# Patient Record
Sex: Female | Born: 1955 | Race: Black or African American | Hispanic: No | Smoking: Former smoker
Health system: Southern US, Community
[De-identification: ages and names within clinical notes are randomized; demographics above are authoritative.]

## PROBLEM LIST (undated history)

## (undated) DIAGNOSIS — E119 Type 2 diabetes mellitus without complications: Secondary | ICD-10-CM

## (undated) DIAGNOSIS — I2510 Atherosclerotic heart disease of native coronary artery without angina pectoris: Secondary | ICD-10-CM

## (undated) DIAGNOSIS — I10 Essential (primary) hypertension: Secondary | ICD-10-CM

## (undated) DIAGNOSIS — J449 Chronic obstructive pulmonary disease, unspecified: Secondary | ICD-10-CM

## (undated) DIAGNOSIS — I998 Other disorder of circulatory system: Secondary | ICD-10-CM

## (undated) DIAGNOSIS — F1410 Cocaine abuse, uncomplicated: Secondary | ICD-10-CM

## (undated) DIAGNOSIS — R7989 Other specified abnormal findings of blood chemistry: Secondary | ICD-10-CM

## (undated) DIAGNOSIS — F419 Anxiety disorder, unspecified: Secondary | ICD-10-CM

## (undated) DIAGNOSIS — N189 Chronic kidney disease, unspecified: Secondary | ICD-10-CM

## (undated) DIAGNOSIS — E1110 Type 2 diabetes mellitus with ketoacidosis without coma: Secondary | ICD-10-CM

## (undated) DIAGNOSIS — F32A Depression, unspecified: Secondary | ICD-10-CM

## (undated) DIAGNOSIS — K219 Gastro-esophageal reflux disease without esophagitis: Secondary | ICD-10-CM

## (undated) DIAGNOSIS — K922 Gastrointestinal hemorrhage, unspecified: Secondary | ICD-10-CM

## (undated) DIAGNOSIS — R778 Other specified abnormalities of plasma proteins: Secondary | ICD-10-CM

## (undated) DIAGNOSIS — M1990 Unspecified osteoarthritis, unspecified site: Secondary | ICD-10-CM

## (undated) HISTORY — PX: EYE SURGERY: SHX253

## (undated) HISTORY — PX: CHOLECYSTECTOMY: SHX55

## (undated) HISTORY — PX: ABDOMINAL HYSTERECTOMY: SHX81

## (undated) HISTORY — DX: Other specified abnormalities of plasma proteins: R77.8

---

## 2004-09-18 ENCOUNTER — Other Ambulatory Visit: Payer: Self-pay

## 2004-09-18 ENCOUNTER — Emergency Department: Payer: Self-pay | Admitting: Emergency Medicine

## 2005-02-01 ENCOUNTER — Emergency Department: Payer: Self-pay | Admitting: Emergency Medicine

## 2005-03-01 ENCOUNTER — Emergency Department: Payer: Self-pay | Admitting: Emergency Medicine

## 2005-03-01 ENCOUNTER — Other Ambulatory Visit: Payer: Self-pay

## 2005-03-03 ENCOUNTER — Emergency Department: Payer: Self-pay | Admitting: Emergency Medicine

## 2005-05-19 ENCOUNTER — Emergency Department: Payer: Self-pay | Admitting: Emergency Medicine

## 2005-05-19 ENCOUNTER — Other Ambulatory Visit: Payer: Self-pay

## 2006-08-30 ENCOUNTER — Emergency Department: Payer: Self-pay | Admitting: Emergency Medicine

## 2006-11-06 ENCOUNTER — Emergency Department: Payer: Self-pay | Admitting: Emergency Medicine

## 2007-02-01 ENCOUNTER — Other Ambulatory Visit: Payer: Self-pay

## 2007-02-01 ENCOUNTER — Emergency Department: Payer: Self-pay | Admitting: Emergency Medicine

## 2007-05-17 ENCOUNTER — Emergency Department: Payer: Self-pay

## 2007-07-14 ENCOUNTER — Emergency Department: Payer: Self-pay | Admitting: Emergency Medicine

## 2015-07-28 ENCOUNTER — Ambulatory Visit
Admission: RE | Admit: 2015-07-28 | Discharge: 2015-07-28 | Disposition: A | Discharge status: Home / Self Care | Payer: Self-pay | Source: Ambulatory Visit | Attending: Family Medicine | Admitting: Family Medicine

## 2015-07-28 ENCOUNTER — Ambulatory Visit
Admission: RE | Admit: 2015-07-28 | Discharge: 2015-07-28 | Disposition: A | Discharge status: Home / Self Care | Payer: Self-pay | Source: Ambulatory Visit | Attending: *Deleted | Admitting: *Deleted

## 2015-07-28 ENCOUNTER — Other Ambulatory Visit (HOSPITAL_COMMUNITY): Payer: Self-pay | Admitting: Family Medicine

## 2015-07-28 DIAGNOSIS — R7611 Nonspecific reaction to tuberculin skin test without active tuberculosis: Secondary | ICD-10-CM

## 2015-11-14 ENCOUNTER — Emergency Department
Admission: EM | Admit: 2015-11-14 | Discharge: 2015-11-14 | Disposition: A | Discharge status: Home / Self Care | Payer: Self-pay | Attending: Emergency Medicine | Admitting: Emergency Medicine

## 2015-11-14 ENCOUNTER — Encounter: Payer: Self-pay | Admitting: Emergency Medicine

## 2015-11-14 DIAGNOSIS — F17200 Nicotine dependence, unspecified, uncomplicated: Secondary | ICD-10-CM | POA: Insufficient documentation

## 2015-11-14 DIAGNOSIS — X500XXA Overexertion from strenuous movement or load, initial encounter: Secondary | ICD-10-CM | POA: Insufficient documentation

## 2015-11-14 DIAGNOSIS — S39011A Strain of muscle, fascia and tendon of abdomen, initial encounter: Secondary | ICD-10-CM | POA: Insufficient documentation

## 2015-11-14 DIAGNOSIS — I10 Essential (primary) hypertension: Secondary | ICD-10-CM | POA: Insufficient documentation

## 2015-11-14 DIAGNOSIS — Y990 Civilian activity done for income or pay: Secondary | ICD-10-CM | POA: Insufficient documentation

## 2015-11-14 DIAGNOSIS — Y929 Unspecified place or not applicable: Secondary | ICD-10-CM | POA: Insufficient documentation

## 2015-11-14 DIAGNOSIS — Y93F2 Activity, caregiving, lifting: Secondary | ICD-10-CM | POA: Insufficient documentation

## 2015-11-14 DIAGNOSIS — E119 Type 2 diabetes mellitus without complications: Secondary | ICD-10-CM | POA: Insufficient documentation

## 2015-11-14 HISTORY — DX: Essential (primary) hypertension: I10

## 2015-11-14 HISTORY — DX: Type 2 diabetes mellitus without complications: E11.9

## 2015-11-14 LAB — CBC
HCT: 33.4 % — ABNORMAL LOW (ref 35.0–47.0)
Hemoglobin: 11.7 g/dL — ABNORMAL LOW (ref 12.0–16.0)
MCH: 30 pg (ref 26.0–34.0)
MCHC: 35 g/dL (ref 32.0–36.0)
MCV: 85.7 fL (ref 80.0–100.0)
Platelets: 293 10*3/uL (ref 150–440)
RBC: 3.9 MIL/uL (ref 3.80–5.20)
RDW: 14 % (ref 11.5–14.5)
WBC: 7.5 10*3/uL (ref 3.6–11.0)

## 2015-11-14 LAB — COMPREHENSIVE METABOLIC PANEL
ALT: 19 U/L (ref 14–54)
AST: 19 U/L (ref 15–41)
Albumin: 4.2 g/dL (ref 3.5–5.0)
Alkaline Phosphatase: 121 U/L (ref 38–126)
Anion gap: 9 (ref 5–15)
BUN: 15 mg/dL (ref 6–20)
CO2: 22 mmol/L (ref 22–32)
Calcium: 10.2 mg/dL (ref 8.9–10.3)
Chloride: 106 mmol/L (ref 101–111)
Creatinine, Ser: 0.77 mg/dL (ref 0.44–1.00)
GFR calc Af Amer: >60 mL/min (ref >60)
GFR calc non Af Amer: >60 mL/min (ref >60)
Glucose, Bld: 208 mg/dL — ABNORMAL HIGH (ref 65–99)
Potassium: 3.9 mmol/L (ref 3.5–5.1)
Sodium: 137 mmol/L (ref 135–145)
Total Bilirubin: 0.6 mg/dL (ref 0.3–1.2)
Total Protein: 8.2 g/dL — ABNORMAL HIGH (ref 6.5–8.1)

## 2015-11-14 LAB — URINALYSIS COMPLETE WITH MICROSCOPIC (ARMC ONLY)
Bilirubin Urine: NEGATIVE
Glucose, UA: 150 mg/dL — AB
Hgb urine dipstick: NEGATIVE
Ketones, ur: NEGATIVE mg/dL
Leukocytes, UA: NEGATIVE
Nitrite: NEGATIVE
Protein, ur: 30 mg/dL — AB
Specific Gravity, Urine: 1.014 (ref 1.005–1.030)
pH: 5 (ref 5.0–8.0)

## 2015-11-14 LAB — LIPASE, BLOOD: Lipase: 85 U/L — ABNORMAL HIGH (ref 11–51)

## 2015-11-14 MED ORDER — HYDROCODONE-ACETAMINOPHEN 5-325 MG PO TABS: 1.0000 | ORAL_TABLET | ORAL | 0 refills | Status: DC | PRN

## 2015-11-14 NOTE — ED Notes (Signed)
States she developed pain to right flank area which radiates into right lower abd about 1 week ago states she was seen and dx'd with muscle strain  Was placed on tramadol but did not help  then was rx'd naprosen  But she did not get it filled has used tylenol with some relief  Denies any fever,n/v/d or urinary sx's

## 2015-11-14 NOTE — Discharge Instructions (Signed)
Begin taking naproxen twice a day with food until completely finished. Norco as needed for pain. Apply ice to the right groin area 3-4 times per day.

## 2015-11-14 NOTE — ED Provider Notes (Signed)
[facility] Emergency Department Provider Note   ____________________________________________   None    (approximate)  I have reviewed the triage vital signs and the nursing notes.   HISTORY  Chief Complaint Abdominal Pain   HPI Sylvia [name] is a 60 y.o. female is here with complaint of right lower quadrant pain. 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 On Tuesday she went back for follow-up. Patient states that they had her do stretches using her right leg abducted and then stretching. Patient states that shortly after that she began having pain in her right lower quadrant. They continued to do more stretches for her work-related muscle strain. Patient states that initially she was given tramadol for pain which did not help and then Tuesday was given naproxen which also did not help. Patient denies any fever, nausea, vomiting, diarrhea or urinary symptoms. Currently she rates her pain as a 10 over 10. Patient has not taken any prescription medicine prior to arrival in the emergency room. She has eaten chips while waiting in the lobby. Her last bowel movement was yesterday and was considered normal.   Past Medical History:  Diagnosis Date  . Diabetes mellitus without complication (HCC)   . Hypertension     There are no active problems to display for this patient.   Past Surgical History:  Procedure Laterality Date  . ABDOMINAL HYSTERECTOMY      Prior to Admission medications   Medication Sig Start Date End Date Taking? Authorizing Provider  HYDROcodone-acetaminophen (NORCO/VICODIN) 5-325 MG tablet Take 1 tablet by mouth every 4 (four) hours as needed for moderate pain. 11/14/15   Tommi Rumps, PA-C    Allergies Penicillins  No family history on file.  Social History Social History  Substance Use Topics  . Smoking status: Current Every Day Smoker  .  Smokeless tobacco: Never Used  . Alcohol use Not on file    Review of Systems Constitutional: No fever/chills ENT: No sore throat. Cardiovascular: Denies chest pain. Respiratory: Denies shortness of breath. Gastrointestinal: Right lower quadrant abdominal pain.  No nausea, no vomiting.  No diarrhea.  No constipation. Genitourinary: Negative for dysuria. Musculoskeletal: Positive right groin pain Skin: Negative for rash. Neurological: Negative for headaches, focal weakness or numbness.  10-point ROS otherwise negative.  ____________________________________________   PHYSICAL EXAM:  VITAL SIGNS: ED Triage Vitals  Enc Vitals Group     BP 11/14/15 1043 124/68     Pulse Rate 11/14/15 1043 96     Resp 11/14/15 1043 18     Temp 11/14/15 1043 98.2 F (36.8 C)     Temp Source 11/14/15 1043 Oral     SpO2 11/14/15 1043 100 %     Weight 11/14/15 1043 154 lb (69.9 kg)     Height 11/14/15 1043 5\' 7"  (1.702 m)     Head Circumference --      Peak Flow --      Pain Score 11/14/15 1044 10     Pain Loc --      Pain Edu? --      Excl. in GC? --     Constitutional: Alert and oriented. Well appearing and in no acute distress. Eyes: Conjunctivae are normal. PERRL. EOMI. Head: Atraumatic. Nose: No congestion/rhinnorhea. Neck: No stridor.   Cardiovascular: Normal rate, regular rhythm. Grossly normal heart sounds.  Good peripheral circulation. Respiratory: Normal respiratory effort.  No  retractions. Lungs CTAB. Gastrointestinal: Soft and nontender. No distention. No abdominal bruits. Bowel sounds normoactive 4 quadrants. Negative tenderness on McBurney's point. There is reproduction of pain if the thigh is abducted. There is also a reproduction of pain with extension of the right leg in the groin area. Musculoskeletal: Moves upper extremities without any difficulty. Patient's gait is antalgic secondary to her right upper thigh pain. Patient is slow with movement from getting to a sitting to  standing position and also to supine position. There is no edema or tenderness on palpation of the lower legs. Neurologic:  Normal speech and language. No gross focal neurologic deficits are appreciated. No gait instability. Skin:  Skin is warm, dry and intact. No rash noted. No ecchymosis, abrasions, erythema was noted. Psychiatric: Mood and affect are normal. Speech and behavior are normal.  ____________________________________________   LABS (all labs ordered are listed, but only abnormal results are displayed)  Labs Reviewed  LIPASE, BLOOD - Abnormal; Notable for the following:       Result Value   Lipase 85 (*)    All other components within normal limits  COMPREHENSIVE METABOLIC PANEL - Abnormal; Notable for the following:    Glucose, Bld 208 (*)    Total Protein 8.2 (*)    All other components within normal limits  CBC - Abnormal; Notable for the following:    Hemoglobin 11.7 (*)    HCT 33.4 (*)    All other components within normal limits  URINALYSIS COMPLETEWITH MICROSCOPIC ([facility] ONLY) - Abnormal; Notable for the following:    Color, Urine YELLOW (*)    APPearance CLEAR (*)    Glucose, UA 150 (*)    Protein, ur 30 (*)    Bacteria, UA RARE (*)    Squamous Epithelial / LPF 0-5 (*)    All other components within normal limits     PROCEDURES  Procedure(s) performed: None  Procedures  Critical Care performed: No  ____________________________________________   INITIAL IMPRESSION / ASSESSMENT AND PLAN / ED COURSE  Pertinent labs & imaging results that were available during my care of the patient were reviewed by me and considered in my medical decision making (see chart for details).  Patient was reassured that this most likely is a muscle strain and exacerbation secondary to her physical therapy. Patient is to continue taking naproxen but also will give a prescription for Norco as needed for pain. She is encouraged to use ice to the area and follow-up with  [facility] clinic if any continued problems.  Clinical Course   Patient was ambulatory without assistance from the emergency department.  ____________________________________________   FINAL CLINICAL IMPRESSION(S) / ED DIAGNOSES  Final diagnoses:  Abdominal muscle strain, initial encounter      NEW MEDICATIONS STARTED DURING THIS VISIT:  Discharge Medication List as of 11/14/2015  2:18 PM    START taking these medications   Details  HYDROcodone-acetaminophen (NORCO/VICODIN) 5-325 MG tablet Take 1 tablet by mouth every 4 (four) hours as needed for moderate pain., Starting Fri 11/14/2015, Print         Note:  This document was prepared using Dragon voice recognition software and may include unintentional dictation errors.    Tommi Rumps, PA-C 11/14/15 1436    Myrna Blazer, MD 11/14/15 [phone]

## 2015-11-14 NOTE — ED Triage Notes (Signed)
Was injuried at last Friday with eval at [facility] right hip pain , with dx of sprain , follow up on Tuesday, did some stretches and felt a pulling to RLQ abd,  And since increased pain to RLQ

## 2016-10-13 ENCOUNTER — Emergency Department: Payer: Self-pay

## 2016-10-13 ENCOUNTER — Emergency Department
Admission: EM | Admit: 2016-10-13 | Discharge: 2016-10-13 | Disposition: A | Discharge status: Home / Self Care | Payer: Self-pay | Attending: Student in an Organized Health Care Education/Training Program | Admitting: Student in an Organized Health Care Education/Training Program

## 2016-10-13 ENCOUNTER — Encounter: Payer: Self-pay | Admitting: *Deleted

## 2016-10-13 DIAGNOSIS — R079 Chest pain, unspecified: Secondary | ICD-10-CM

## 2016-10-13 DIAGNOSIS — Z7984 Long term (current) use of oral hypoglycemic drugs: Secondary | ICD-10-CM | POA: Insufficient documentation

## 2016-10-13 DIAGNOSIS — F17200 Nicotine dependence, unspecified, uncomplicated: Secondary | ICD-10-CM | POA: Insufficient documentation

## 2016-10-13 DIAGNOSIS — J40 Bronchitis, not specified as acute or chronic: Secondary | ICD-10-CM | POA: Insufficient documentation

## 2016-10-13 DIAGNOSIS — I10 Essential (primary) hypertension: Secondary | ICD-10-CM | POA: Insufficient documentation

## 2016-10-13 DIAGNOSIS — Z79899 Other long term (current) drug therapy: Secondary | ICD-10-CM | POA: Insufficient documentation

## 2016-10-13 DIAGNOSIS — E119 Type 2 diabetes mellitus without complications: Secondary | ICD-10-CM | POA: Insufficient documentation

## 2016-10-13 LAB — BASIC METABOLIC PANEL
Anion gap: 9 (ref 5–15)
BUN: 11 mg/dL (ref 6–20)
CO2: 21 mmol/L — ABNORMAL LOW (ref 22–32)
Calcium: 9.6 mg/dL (ref 8.9–10.3)
Chloride: 103 mmol/L (ref 101–111)
Creatinine, Ser: 0.75 mg/dL (ref 0.44–1.00)
GFR calc Af Amer: >60 mL/min (ref >60)
GFR calc non Af Amer: >60 mL/min (ref >60)
Glucose, Bld: 212 mg/dL — ABNORMAL HIGH (ref 65–99)
Potassium: 3.8 mmol/L (ref 3.5–5.1)
Sodium: 133 mmol/L — ABNORMAL LOW (ref 135–145)

## 2016-10-13 LAB — CBC
HCT: 34.3 % — ABNORMAL LOW (ref 35.0–47.0)
Hemoglobin: 11.9 g/dL — ABNORMAL LOW (ref 12.0–16.0)
MCH: 28.9 pg (ref 26.0–34.0)
MCHC: 34.6 g/dL (ref 32.0–36.0)
MCV: 83.7 fL (ref 80.0–100.0)
Platelets: 307 10*3/uL (ref 150–440)
RBC: 4.1 MIL/uL (ref 3.80–5.20)
RDW: 14.2 % (ref 11.5–14.5)
WBC: 7.4 10*3/uL (ref 3.6–11.0)

## 2016-10-13 LAB — TROPONIN I
Troponin I: <0.03 ng/mL (ref <0.03)
Troponin I: <0.03 ng/mL (ref <0.03)

## 2016-10-13 MED ORDER — NITROGLYCERIN 0.4 MG SL SUBL
0.4000 mg | SUBLINGUAL_TABLET | SUBLINGUAL | Status: DC | PRN
  Filled 2016-10-13: qty 1

## 2016-10-13 MED ORDER — METHYLPREDNISOLONE SODIUM SUCC 125 MG IJ SOLR
60.0000 mg | Freq: Once | INTRAMUSCULAR | Status: AC
  Administered 2016-10-13: 60 mg via INTRAVENOUS
  Filled 2016-10-13: qty 2

## 2016-10-13 MED ORDER — IOPAMIDOL (ISOVUE-370) INJECTION 76%
75.0000 mL | Freq: Once | INTRAVENOUS | Status: AC | PRN
  Administered 2016-10-13: 75 mL via INTRAVENOUS

## 2016-10-13 MED ORDER — FENTANYL CITRATE (PF) 100 MCG/2ML IJ SOLN
50.0000 ug | INTRAMUSCULAR | Status: DC | PRN
  Administered 2016-10-13: 50 ug via INTRAVENOUS
  Filled 2016-10-13: qty 2

## 2016-10-13 MED ORDER — ALBUTEROL SULFATE HFA 108 (90 BASE) MCG/ACT IN AERS: 2.0000 | INHALATION_SPRAY | Freq: Four times a day (QID) | RESPIRATORY_TRACT | 2 refills | Status: DC | PRN

## 2016-10-13 MED ORDER — HYDROCODONE-ACETAMINOPHEN 5-325 MG PO TABS: 1.0000 | ORAL_TABLET | ORAL | 0 refills | Status: DC | PRN

## 2016-10-13 MED ORDER — LORAZEPAM 2 MG/ML IJ SOLN
1.0000 mg | Freq: Once | INTRAMUSCULAR | Status: AC
  Administered 2016-10-13: 1 mg via INTRAVENOUS
  Filled 2016-10-13: qty 1

## 2016-10-13 MED ORDER — IPRATROPIUM-ALBUTEROL 0.5-2.5 (3) MG/3ML IN SOLN
3.0000 mL | Freq: Once | RESPIRATORY_TRACT | Status: AC
  Administered 2016-10-13: 3 mL via RESPIRATORY_TRACT
  Filled 2016-10-13: qty 3

## 2016-10-13 MED ORDER — IBUPROFEN 600 MG PO TABS
600.0000 mg | ORAL_TABLET | Freq: Once | ORAL | Status: DC
  Filled 2016-10-13: qty 1

## 2016-10-13 MED ORDER — PREDNISONE 20 MG PO TABS: 40.0000 mg | ORAL_TABLET | Freq: Every day | ORAL | 0 refills | Status: AC

## 2016-10-13 NOTE — ED Triage Notes (Addendum)
states mid center chest tightness that began this AM, states worse with deep breathes, also states neck pain, states dry cough, pt crying in triage, resp even and unlabored

## 2016-10-13 NOTE — ED Provider Notes (Signed)
[facility] Emergency Department Provider Note    First MD Initiated Contact with Patient 10/13/16 1149     (approximate)  I have reviewed the triage vital signs and the nursing notes.   HISTORY  Chief Complaint Chest Pain    HPI Sylvia [name] is a 61 y.o. 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 Patient states the pain did get worse with taking a deep breath. The patient felt very anxious and tearful. There is no exertional component. She does feel short of breath and it hurts to take a deep breath. No recent fevers. Denies any previous history of heart attack. No lower extremity swelling. No pain radiating through to her back. Denies numbness or tingling in her arms or legs. Patient arrives to the ER very tearful and nearly refused treatment after being brought back to one of the treatment rooms where she lost her mother and February of this year. Patient subsequently lost her daughter in this ER as well. Patient is very tearful and states that she's been under a lot of stress recently.   Past Medical History:  Diagnosis Date  . Diabetes mellitus without complication (HCC)   . Hypertension    FMH: + cardiac disease Past Surgical History:  Procedure Laterality Date  . ABDOMINAL HYSTERECTOMY     There are no active problems to display for this patient.     Prior to Admission medications   Medication Sig Start Date End Date Taking? Authorizing Provider  atorvastatin (LIPITOR) 40 MG tablet Take 40 mg by mouth daily. 08/26/16  Yes [provider]  glipiZIDE (GLUCOTROL) 5 MG tablet Take 5 mg by mouth daily before breakfast.   Yes [provider]  albuterol (PROVENTIL HFA;VENTOLIN HFA) 108 (90 Base) MCG/ACT inhaler Inhale 2 puffs into the lungs every 6 (six)  hours as needed for wheezing or shortness of breath. 10/13/16   Willy Eddy, MD  HYDROcodone-acetaminophen (NORCO) 5-325 MG tablet Take 1 tablet by mouth every 4 (four) hours as needed for moderate pain. 10/13/16   Willy Eddy, MD  HYDROcodone-acetaminophen (NORCO/VICODIN) 5-325 MG tablet Take 1 tablet by mouth every 4 (four) hours as needed for moderate pain. Patient not taking: Reported on 10/13/2016 11/14/15   Tommi Rumps, PA-C  predniSONE (DELTASONE) 20 MG tablet Take 2 tablets (40 mg total) by mouth daily. 10/13/16 10/18/16  Willy Eddy, MD    Allergies Penicillins    Social History Social History  Substance Use Topics  . Smoking status: Current Every Day Smoker  . Smokeless tobacco: Never Used  . Alcohol use Not on file    Review of Systems Patient denies headaches, rhinorrhea, blurry vision, numbness, shortness of breath, chest pain, edema, cough, abdominal pain, nausea, vomiting, diarrhea, dysuria, fevers, rashes or hallucinations unless otherwise stated above in HPI. ____________________________________________   PHYSICAL EXAM:  VITAL SIGNS: Vitals:   10/13/16 1330 10/13/16 1330  BP: (!) 146/81 (!) 146/81  Pulse: 70 71  Resp: 16 18  Temp:      Constitutional: Alert and oriented. Tearful but consolable. Eyes: Conjunctivae are normal.  Head: Atraumatic. Nose: No congestion/rhinnorhea. Mouth/Throat: Mucous membranes are moist.   Neck: No stridor. Painless ROM.  Cardiovascular: Normal rate, regular rhythm. Grossly normal heart sounds.  Good peripheral circulation. Respiratory: Normal respiratory effort.  No retractions. Lungs with occasional wheeze  throughout Gastrointestinal: Soft and nontender. No distention. No abdominal bruits. No CVA tenderness. Musculoskeletal: No lower extremity tenderness nor edema.  No joint effusions. Neurologic:  Normal speech and language. No gross focal neurologic deficits are appreciated. No facial droop Skin:  Skin  is warm, dry and intact. No rash noted. Psychiatric: tearful, anxious appearing, no SI or HI  ____________________________________________   LABS (all labs ordered are listed, but only abnormal results are displayed)  Results for orders placed or performed during the hospital encounter of 10/13/16 (from the past 24 hour(s))  Basic metabolic panel     Status: Abnormal   Collection Time: 10/13/16 11:10 AM  Result Value Ref Range   Sodium 133 (L) 135 - 145 mmol/L   Potassium 3.8 3.5 - 5.1 mmol/L   Chloride 103 101 - 111 mmol/L   CO2 21 (L) 22 - 32 mmol/L   Glucose, Bld 212 (H) 65 - 99 mg/dL   BUN 11 6 - 20 mg/dL   Creatinine, Ser 6.37 0.44 - 1.00 mg/dL   Calcium 9.6 8.9 - 85.8 mg/dL   GFR calc non Af Amer >60 >60 mL/min   GFR calc Af Amer >60 >60 mL/min   Anion gap 9 5 - 15  CBC     Status: Abnormal   Collection Time: 10/13/16 11:10 AM  Result Value Ref Range   WBC 7.4 3.6 - 11.0 K/uL   RBC 4.10 3.80 - 5.20 MIL/uL   Hemoglobin 11.9 (L) 12.0 - 16.0 g/dL   HCT 85.0 (L) 27.7 - 41.2 %   MCV 83.7 80.0 - 100.0 fL   MCH 28.9 26.0 - 34.0 pg   MCHC 34.6 32.0 - 36.0 g/dL   RDW 87.8 67.6 - 72.0 %   Platelets 307 150 - 440 K/uL  Troponin I     Status: None   Collection Time: 10/13/16 11:10 AM  Result Value Ref Range   Troponin I <0.03 <0.03 ng/mL  Troponin I     Status: None   Collection Time: 10/13/16  3:26 PM  Result Value Ref Range   Troponin I <0.03 <0.03 ng/mL   ____________________________________________  EKG My review and personal interpretation at Time: 11:08   Indication: chest pain  Rate: 90  Rhythm: sinus Axis: normal  Other: no stemi, normal intervals ____________________________________________  RADIOLOGY  I personally reviewed all radiographic images ordered to evaluate for the above acute complaints and reviewed radiology reports and findings.  These findings were personally discussed with the patient.  Please see medical record for radiology  report.  ____________________________________________   PROCEDURES  Procedure(s) performed:  Procedures    Critical Care performed: no ____________________________________________   INITIAL IMPRESSION / ASSESSMENT AND PLAN / ED COURSE  Pertinent labs & imaging results that were available during my care of the patient were reviewed by me and considered in my medical decision making (see chart for details).  DDX: ACS, pericarditis, esophagitis, boerhaaves, pe, dissection, pna, bronchitis, costochondritis   Peony Barner is a 61 y.o. who presents to the ED with chest pain and shortness of breath as described above. Patient very tearful but nontoxic appearing. EKG shows no evidence of acute ST elevation MI or acute ischemia and her initial troponin is negative. Patient does describe a pleuritic chest pain therefore we will further evaluate for evidence of pulmonary was him with CT angiogram.. Less consistent with dissection as she has no pain radiating through to her back and has equal pulses throughout. Given her recent stressors and also  recent loss of close family members tak[place]subo  is considered in the differential.  However, she doesn't have any objective findings of chf at this time.  Patient with Heart score of 3 (10110) and TIMI score of 1 We'll treat patient with IV fluids and IV anxiolysis. Patient will require further evaluation and she is otherwise high risk and will attempt to get her comfortable.  ----------------------------------------- 12:07 PM on 10/13/2016 -----------------------------------------   OBSERVATION CARE: This patient is being placed under observation care for the following reasons: Chest pain with repeat testing to rule out ischemia   Clinical Course as of Oct 14 1711  Wed Oct 13, 2016  1347 Chest x-ray without evidence of cardiomegaly. There is diffuse bronchitic changes. CT angiogram of the chest shows no evidence of dissection, pulmonary  embolism or infiltrate. No evidence of pericardial effusion. Patient is currently chest pain-free and is comfortable. We'll further risk stratify with repeat troponin.  [PR]  1638 Patient reassessed and is chest pain-free at this time. Those significant improvement after Ativan. Repeat troponin is negative. Based on her low risk heart score, negative CT angiogram and improvement in pain and do feel the patient is appropriate for close follow-up with cardiology.  Patient will be started on steroids as well as albuterol treatment for component of bronchitis. Smoking cessation advice was provided.  Have discussed with the patient and available family all diagnostics and treatments performed thus far and all questions were answered to the best of my ability. The patient demonstrates understanding and agreement with plan.   [PR]    Clinical Course User Index [PR] Willy Eddy, MD   ----------------------------------------- 4:38 PM on 10/13/2016 -----------------------------------------   END OF OBSERVATION STATUS: After an appropriate period of observation, this patient is being discharged due to the following reason(s):  Negative enzymes and hds    ____________________________________________   FINAL CLINICAL IMPRESSION(S) / ED DIAGNOSES  Final diagnoses:  Chest pain, unspecified type  Bronchitis      NEW MEDICATIONS STARTED DURING THIS VISIT:  Discharge Medication List as of 10/13/2016  4:42 PM       Note:  This document was prepared using Dragon voice recognition software and may include unintentional dictation errors.    Willy Eddy, MD 10/13/16 [phone]

## 2016-10-13 NOTE — ED Notes (Signed)
Substernal chest pain worse upon palpation.  Pt tearful and appears anxious in room.  EDP at bedside.

## 2017-02-25 ENCOUNTER — Encounter: Payer: Self-pay | Admitting: Emergency Medicine

## 2017-02-25 ENCOUNTER — Other Ambulatory Visit: Payer: Self-pay

## 2017-02-25 ENCOUNTER — Emergency Department: Payer: Self-pay

## 2017-02-25 ENCOUNTER — Emergency Department
Admission: EM | Admit: 2017-02-25 | Discharge: 2017-02-25 | Disposition: A | Discharge status: Home / Self Care | Payer: Self-pay | Attending: Emergency Medicine | Admitting: Emergency Medicine

## 2017-02-25 DIAGNOSIS — R42 Dizziness and giddiness: Secondary | ICD-10-CM | POA: Insufficient documentation

## 2017-02-25 DIAGNOSIS — I10 Essential (primary) hypertension: Secondary | ICD-10-CM | POA: Insufficient documentation

## 2017-02-25 DIAGNOSIS — Z79899 Other long term (current) drug therapy: Secondary | ICD-10-CM | POA: Insufficient documentation

## 2017-02-25 DIAGNOSIS — R197 Diarrhea, unspecified: Secondary | ICD-10-CM | POA: Insufficient documentation

## 2017-02-25 DIAGNOSIS — E119 Type 2 diabetes mellitus without complications: Secondary | ICD-10-CM | POA: Insufficient documentation

## 2017-02-25 DIAGNOSIS — Z7984 Long term (current) use of oral hypoglycemic drugs: Secondary | ICD-10-CM | POA: Insufficient documentation

## 2017-02-25 DIAGNOSIS — B349 Viral infection, unspecified: Secondary | ICD-10-CM | POA: Insufficient documentation

## 2017-02-25 DIAGNOSIS — F17200 Nicotine dependence, unspecified, uncomplicated: Secondary | ICD-10-CM | POA: Insufficient documentation

## 2017-02-25 LAB — URINALYSIS, COMPLETE (UACMP) WITH MICROSCOPIC
Bacteria, UA: NONE SEEN
Bilirubin Urine: NEGATIVE
Glucose, UA: >=500 mg/dL — AB
Ketones, ur: NEGATIVE mg/dL
Leukocytes, UA: NEGATIVE
Nitrite: NEGATIVE
Protein, ur: 100 mg/dL — AB
Specific Gravity, Urine: 1.012 (ref 1.005–1.030)
pH: 5 (ref 5.0–8.0)

## 2017-02-25 LAB — BASIC METABOLIC PANEL
Anion gap: 9 (ref 5–15)
BUN: 10 mg/dL (ref 6–20)
CO2: 23 mmol/L (ref 22–32)
Calcium: 10 mg/dL (ref 8.9–10.3)
Chloride: 106 mmol/L (ref 101–111)
Creatinine, Ser: 0.73 mg/dL (ref 0.44–1.00)
GFR calc Af Amer: >60 mL/min (ref >60)
GFR calc non Af Amer: >60 mL/min (ref >60)
Glucose, Bld: 190 mg/dL — ABNORMAL HIGH (ref 65–99)
Potassium: 3.7 mmol/L (ref 3.5–5.1)
Sodium: 138 mmol/L (ref 135–145)

## 2017-02-25 LAB — CBC
HCT: 33 % — ABNORMAL LOW (ref 35.0–47.0)
Hemoglobin: 11.4 g/dL — ABNORMAL LOW (ref 12.0–16.0)
MCH: 30 pg (ref 26.0–34.0)
MCHC: 34.6 g/dL (ref 32.0–36.0)
MCV: 86.5 fL (ref 80.0–100.0)
Platelets: 359 10*3/uL (ref 150–440)
RBC: 3.81 MIL/uL (ref 3.80–5.20)
RDW: 13.9 % (ref 11.5–14.5)
WBC: 7.6 10*3/uL (ref 3.6–11.0)

## 2017-02-25 LAB — CK: Total CK: 89 U/L (ref 38–234)

## 2017-02-25 LAB — TROPONIN I: Troponin I: <0.03 ng/mL (ref <0.03)

## 2017-02-25 MED ORDER — MECLIZINE HCL 25 MG PO TABS: 25.0000 mg | ORAL_TABLET | Freq: Three times a day (TID) | ORAL | 0 refills | Status: DC | PRN

## 2017-02-25 MED ORDER — ALBUTEROL SULFATE (2.5 MG/3ML) 0.083% IN NEBU
2.5000 mg | INHALATION_SOLUTION | Freq: Once | RESPIRATORY_TRACT | Status: AC
  Administered 2017-02-25: 2.5 mg via RESPIRATORY_TRACT
  Filled 2017-02-25: qty 3

## 2017-02-25 MED ORDER — SODIUM CHLORIDE 0.9 % IV BOLUS (SEPSIS)
1000.0000 mL | Freq: Once | INTRAVENOUS | Status: AC
  Administered 2017-02-25: 1000 mL via INTRAVENOUS

## 2017-02-25 MED ORDER — MECLIZINE HCL 25 MG PO TABS
25.0000 mg | ORAL_TABLET | Freq: Once | ORAL | Status: AC
  Administered 2017-02-25: 25 mg via ORAL
  Filled 2017-02-25: qty 1

## 2017-02-25 MED ORDER — IBUPROFEN 800 MG PO TABS
800.0000 mg | ORAL_TABLET | Freq: Once | ORAL | Status: AC
  Administered 2017-02-25: 800 mg via ORAL
  Filled 2017-02-25: qty 1

## 2017-02-25 MED ORDER — KETOROLAC TROMETHAMINE 30 MG/ML IJ SOLN
30.0000 mg | Freq: Once | INTRAMUSCULAR | Status: AC
  Administered 2017-02-25: 30 mg via INTRAVENOUS
  Filled 2017-02-25: qty 1

## 2017-02-25 NOTE — ED Notes (Signed)
First Nurse Note:  Patient ambulatory to registration with steady gait.  Complaining of dizziness and body aches.  Patient placed in wheelchair at this time.  Patient denies chest pain or shortness of breath.

## 2017-02-25 NOTE — ED Provider Notes (Signed)
This patient was signed out to me by Dr. Pershing Proud.  She presented to the emergency department with a recent history of diarrhea cough and dizziness, which Dr. Pershing Proud felt was most likely due to a URI with associated vertigo.  She was treated with meclizine here, and is feeling better at this time.  Her workup is reassuring and her chest x-ray does not show any pneumonia.  There are some rhonchitic changes that are chronic and likely due to her ongoing tobacco abuse.  At this time, the patient is stable for discharge and understands return precautions and follow-up instructions which I have discussed with her.   Rockne Menghini, MD 02/25/17 [phone]

## 2017-02-25 NOTE — ED Notes (Signed)
Sign pad inoperable at dc, patient signed paper copy.

## 2017-02-25 NOTE — ED Provider Notes (Signed)
[facility] Emergency Department Provider Note  ____________________________________________   First MD Initiated Contact with Patient 02/25/17 1423     (approximate)  I have reviewed the triage vital signs and the nursing notes.   HISTORY  Chief Complaint Dizziness; Generalized Body Aches; and Chills   HPI Lysette Lindenbaum is a 61 y.o. female with a history of diabetes and hypertension was presented to the emergency department today with body aches she says from "her neck down," as well as chills, cough and diarrhea.  She denies any known sick contacts.  No recent antibiotics.  Patient takes Lipitor as well as glipizide.  Also says that she was feeling dizzy especially when she turns her head from side to side but denies any ear pressure or ringing in her ears bilaterally.  Denies any fever.  Says that she felt a little bit confused this morning and that is why she presented to the emergency department.    Past Medical History:  Diagnosis Date  . Diabetes mellitus without complication (HCC)   . Hypertension     There are no active problems to display for this patient.   Past Surgical History:  Procedure Laterality Date  . ABDOMINAL HYSTERECTOMY      Prior to Admission medications   Medication Sig Start Date End Date Taking? Authorizing Provider  albuterol (PROVENTIL HFA;VENTOLIN HFA) 108 (90 Base) MCG/ACT inhaler Inhale 2 puffs into the lungs every 6 (six) hours as needed for wheezing or shortness of breath. 10/13/16   Willy Eddy, MD  atorvastatin (LIPITOR) 40 MG tablet Take 40 mg by mouth daily. 08/26/16   [provider]  glipiZIDE (GLUCOTROL) 5 MG tablet Take 5 mg by mouth daily before breakfast.    [provider]  HYDROcodone-acetaminophen (NORCO) 5-325 MG tablet Take 1 tablet by mouth every 4 (four) hours as needed for moderate pain. Patient not taking: Reported on 02/25/2017 10/13/16   Willy Eddy, MD    HYDROcodone-acetaminophen (NORCO/VICODIN) 5-325 MG tablet Take 1 tablet by mouth every 4 (four) hours as needed for moderate pain. Patient not taking: Reported on 02/25/2017 11/14/15   Tommi Rumps, PA-C    Allergies Penicillins  No family history on file.  Social History Social History   Tobacco Use  . Smoking status: Current Every Day Smoker  . Smokeless tobacco: Never Used  Substance Use Topics  . Alcohol use: No    Frequency: Never  . Drug use: Not on file    Review of Systems  Constitutional: Chills Eyes: No visual changes. ENT: No sore throat. Cardiovascular: Denies chest pain. Respiratory: Denies shortness of breath. Gastrointestinal: No abdominal pain.  No nausea, no vomiting. No constipation. Genitourinary: Negative for dysuria. Musculoskeletal: Negative for back pain. Skin: Negative for rash. Neurological: Negative for headaches, focal weakness or numbness.   ____________________________________________   PHYSICAL EXAM:  VITAL SIGNS: ED Triage Vitals  Enc Vitals Group     BP 02/25/17 1203 (!) 149/73     Pulse Rate 02/25/17 1203 97     Resp 02/25/17 1203 16     Temp 02/25/17 1203 98.2 F (36.8 C)     Temp Source 02/25/17 1203 Oral     SpO2 02/25/17 1203 100 %     Weight 02/25/17 1204 156 lb (70.8 kg)     Height 02/25/17 1204 5\' 6"  (1.676 m)     Head Circumference --      Peak Flow --      Pain Score 02/25/17 1203  8     Pain Loc --      Pain Edu? --      Excl. in GC? --     Constitutional: Alert and oriented. Well appearing and in no acute distress. Eyes: Conjunctivae are normal.  Head: Atraumatic.  Right-sided TM with mild bulging of the membrane but without any erythema.  Not dull to light.  Normal left TM. Nose: No congestion/rhinnorhea. Mouth/Throat: Mucous membranes are moist.  Neck: No stridor.   Cardiovascular: Normal rate, regular rhythm. Grossly normal heart sounds.   Respiratory: Normal respiratory effort.  No retractions.  Lungs CTAB. Gastrointestinal: Soft and nontender. No distention.  Musculoskeletal: No lower extremity tenderness nor edema.  No joint effusions. Neurologic:  Normal speech and language. No gross focal neurologic deficits are appreciated. Skin:  Skin is warm, dry and intact. No rash noted. Psychiatric: Mood and affect are normal. Speech and behavior are normal.  ____________________________________________   LABS (all labs ordered are listed, but only abnormal results are displayed)  Labs Reviewed  BASIC METABOLIC PANEL - Abnormal; Notable for the following components:      Result Value   Glucose, Bld 190 (*)    All other components within normal limits  CBC - Abnormal; Notable for the following components:   Hemoglobin 11.4 (*)    HCT 33.0 (*)    All other components within normal limits  URINALYSIS, COMPLETE (UACMP) WITH MICROSCOPIC - Abnormal; Notable for the following components:   Color, Urine YELLOW (*)    APPearance HAZY (*)    Glucose, UA >=500 (*)    Hgb urine dipstick SMALL (*)    Protein, ur 100 (*)    Squamous Epithelial / LPF 6-30 (*)    All other components within normal limits  CK  TROPONIN I   ____________________________________________  EKG  ED ECG REPORT I, Arelia Longest, the attending physician, personally viewed and interpreted this ECG.   Date: 02/25/2017  EKG Time: 1215  Rate: 93  Rhythm: normal sinus rhythm  Axis: Normal  Intervals:none  ST&T Change: No ST segment elevation or depression.  No abnormal T wave inversion.  ____________________________________________  RADIOLOGY  Pending cxr ____________________________________________   PROCEDURES  Procedure(s) performed: None  Procedures  Critical Care performed: No  ____________________________________________   INITIAL IMPRESSION / ASSESSMENT AND PLAN / ED COURSE  Pertinent labs & imaging results that were available during my care of the patient were reviewed by me and  considered in my medical decision making (see chart for details).  DDX: Rhabdomyolysis, viral syndrome, pneumonia, vertigo  As part of my medical decision making, I reviewed the following data within the electronic MEDICAL RECORD NUMBER Notes from prior ED visits  Patient be given fluids, Toradol and meclizine.  Concern for rhabdomyolysis given Lipitor and body aches.  Patient says that she also is feeling wheezy after taking deep breaths during the exam.  Will give a treatment of albuterol.  Patient says that she is a history of bronchitis.    ----------------------------------------- 3:40 PM on 02/25/2017 -----------------------------------------  Patient awaiting chest x-ray at this time.  Dr. Sharma Covert to reevaluate.  ____________________________________________   FINAL CLINICAL IMPRESSION(S) / ED DIAGNOSES  Viral syndrome.  Vertigo.  Diarrhea.    NEW MEDICATIONS STARTED DURING THIS VISIT:  This SmartLink is deprecated. Use AVSMEDLIST instead to display the medication list for a patient.   Note:  This document was prepared using Dragon voice recognition software and may include unintentional dictation errors.  Myrna Blazer, MD 02/25/17 1540

## 2017-02-25 NOTE — ED Triage Notes (Signed)
Chills and body aches since last night.  Felt dizzy lying down.

## 2017-02-25 NOTE — Discharge Instructions (Addendum)
Please return to the emergency department if you develop severe pain, fever, lightheadedness or syncope, inability to keep down fluids, or any other symptoms concerning to you.

## 2017-03-14 ENCOUNTER — Other Ambulatory Visit: Payer: Self-pay | Admitting: Physician Assistant

## 2017-03-14 DIAGNOSIS — Z1231 Encounter for screening mammogram for malignant neoplasm of breast: Secondary | ICD-10-CM

## 2017-04-05 ENCOUNTER — Ambulatory Visit
Admission: RE | Admit: 2017-04-05 | Discharge: 2017-04-05 | Disposition: A | Discharge status: Home / Self Care | Payer: Medicaid Other | Source: Ambulatory Visit | Attending: Physician Assistant | Admitting: Physician Assistant

## 2017-04-05 DIAGNOSIS — Z1231 Encounter for screening mammogram for malignant neoplasm of breast: Secondary | ICD-10-CM | POA: Insufficient documentation

## 2017-08-20 ENCOUNTER — Encounter: Payer: Self-pay | Admitting: Emergency Medicine

## 2017-08-20 ENCOUNTER — Other Ambulatory Visit: Payer: Self-pay

## 2017-08-20 ENCOUNTER — Emergency Department
Admission: EM | Admit: 2017-08-20 | Discharge: 2017-08-20 | Disposition: A | Discharge status: Home / Self Care | Payer: Medicaid Other | Attending: Emergency Medicine | Admitting: Emergency Medicine

## 2017-08-20 ENCOUNTER — Emergency Department: Payer: Medicaid Other

## 2017-08-20 DIAGNOSIS — R0602 Shortness of breath: Secondary | ICD-10-CM | POA: Insufficient documentation

## 2017-08-20 DIAGNOSIS — Z79899 Other long term (current) drug therapy: Secondary | ICD-10-CM | POA: Insufficient documentation

## 2017-08-20 DIAGNOSIS — F17200 Nicotine dependence, unspecified, uncomplicated: Secondary | ICD-10-CM | POA: Insufficient documentation

## 2017-08-20 DIAGNOSIS — J40 Bronchitis, not specified as acute or chronic: Secondary | ICD-10-CM

## 2017-08-20 DIAGNOSIS — J441 Chronic obstructive pulmonary disease with (acute) exacerbation: Secondary | ICD-10-CM

## 2017-08-20 DIAGNOSIS — Z720 Tobacco use: Secondary | ICD-10-CM

## 2017-08-20 DIAGNOSIS — I10 Essential (primary) hypertension: Secondary | ICD-10-CM | POA: Insufficient documentation

## 2017-08-20 DIAGNOSIS — E119 Type 2 diabetes mellitus without complications: Secondary | ICD-10-CM | POA: Insufficient documentation

## 2017-08-20 DIAGNOSIS — Z7984 Long term (current) use of oral hypoglycemic drugs: Secondary | ICD-10-CM | POA: Insufficient documentation

## 2017-08-20 LAB — CBC WITH DIFFERENTIAL/PLATELET
Basophils Absolute: 0.1 10*3/uL (ref 0–0.1)
Basophils Relative: 1 %
Eosinophils Absolute: 0.2 10*3/uL (ref 0–0.7)
Eosinophils Relative: 3 %
HCT: 34.1 % — ABNORMAL LOW (ref 35.0–47.0)
Hemoglobin: 11.8 g/dL — ABNORMAL LOW (ref 12.0–16.0)
Lymphocytes Relative: 42 %
Lymphs Abs: 2.9 10*3/uL (ref 1.0–3.6)
MCH: 29.8 pg (ref 26.0–34.0)
MCHC: 34.6 g/dL (ref 32.0–36.0)
MCV: 86.1 fL (ref 80.0–100.0)
Monocytes Absolute: 0.5 10*3/uL (ref 0.2–0.9)
Monocytes Relative: 7 %
Neutro Abs: 3.2 10*3/uL (ref 1.4–6.5)
Neutrophils Relative %: 47 %
Platelets: 235 10*3/uL (ref 150–440)
RBC: 3.96 MIL/uL (ref 3.80–5.20)
RDW: 14.3 % (ref 11.5–14.5)
WBC: 6.9 10*3/uL (ref 3.6–11.0)

## 2017-08-20 LAB — BASIC METABOLIC PANEL
Anion gap: 10 (ref 5–15)
BUN: 12 mg/dL (ref 6–20)
CO2: 21 mmol/L — ABNORMAL LOW (ref 22–32)
Calcium: 9.5 mg/dL (ref 8.9–10.3)
Chloride: 109 mmol/L (ref 101–111)
Creatinine, Ser: 0.82 mg/dL (ref 0.44–1.00)
GFR calc Af Amer: >60 mL/min (ref >60)
GFR calc non Af Amer: >60 mL/min (ref >60)
Glucose, Bld: 315 mg/dL — ABNORMAL HIGH (ref 65–99)
Potassium: 3.4 mmol/L — ABNORMAL LOW (ref 3.5–5.1)
Sodium: 140 mmol/L (ref 135–145)

## 2017-08-20 LAB — BRAIN NATRIURETIC PEPTIDE: B Natriuretic Peptide: 14 pg/mL (ref 0.0–100.0)

## 2017-08-20 LAB — TROPONIN I: Troponin I: <0.03 ng/mL (ref <0.03)

## 2017-08-20 MED ORDER — METHYLPREDNISOLONE SODIUM SUCC 125 MG IJ SOLR
125.0000 mg | Freq: Once | INTRAMUSCULAR | Status: AC
  Administered 2017-08-20: 125 mg via INTRAVENOUS
  Filled 2017-08-20: qty 2

## 2017-08-20 MED ORDER — ALBUTEROL SULFATE HFA 108 (90 BASE) MCG/ACT IN AERS: 2.0000 | INHALATION_SPRAY | Freq: Four times a day (QID) | RESPIRATORY_TRACT | 2 refills | Status: DC | PRN

## 2017-08-20 MED ORDER — AZITHROMYCIN 250 MG PO TABS: ORAL_TABLET | ORAL | 0 refills | Status: AC

## 2017-08-20 MED ORDER — ALBUTEROL SULFATE (2.5 MG/3ML) 0.083% IN NEBU
5.0000 mg | INHALATION_SOLUTION | Freq: Once | RESPIRATORY_TRACT | Status: AC
  Administered 2017-08-20: 5 mg via RESPIRATORY_TRACT

## 2017-08-20 MED ORDER — SODIUM CHLORIDE 0.9 % IV BOLUS
1000.0000 mL | Freq: Once | INTRAVENOUS | Status: AC
  Administered 2017-08-20: 1000 mL via INTRAVENOUS

## 2017-08-20 MED ORDER — ALBUTEROL SULFATE (2.5 MG/3ML) 0.083% IN NEBU
5.0000 mg | INHALATION_SOLUTION | Freq: Once | RESPIRATORY_TRACT | Status: AC
  Administered 2017-08-20: 5 mg via RESPIRATORY_TRACT
  Filled 2017-08-20: qty 6

## 2017-08-20 MED ORDER — SODIUM CHLORIDE 0.9 % IV BOLUS
500.0000 mL | Freq: Once | INTRAVENOUS | Status: AC
  Administered 2017-08-20: 500 mL via INTRAVENOUS

## 2017-08-20 MED ORDER — AZITHROMYCIN 500 MG PO TABS
500.0000 mg | ORAL_TABLET | Freq: Once | ORAL | Status: AC
  Administered 2017-08-20: 500 mg via ORAL
  Filled 2017-08-20: qty 1

## 2017-08-20 MED ORDER — PREDNISONE 20 MG PO TABS: 60.0000 mg | ORAL_TABLET | Freq: Every day | ORAL | 0 refills | Status: DC

## 2017-08-20 MED ORDER — ALBUTEROL SULFATE (2.5 MG/3ML) 0.083% IN NEBU
INHALATION_SOLUTION | RESPIRATORY_TRACT | Status: AC
  Administered 2017-08-20: 5 mg via RESPIRATORY_TRACT
  Filled 2017-08-20: qty 6

## 2017-08-20 NOTE — ED Provider Notes (Addendum)
[facility] Emergency Department Provider Note  ____________________________________________   I have reviewed the triage vital signs and the nursing notes. Where available I have reviewed prior notes and, if possible and indicated, outside hospital notes.    HISTORY  Chief Complaint Cough and Shortness of Breath    HPI Sylvia [name] is a 62 y.o. female  with a history of COPD, who is out of her home nebulizer/albuterol inhaler, continues to smoke, presents today with a productive cough for a week.  It was actually productive initially week ago with fever a week ago, although symptoms have resolved including area however she has a persistent cough that she cannot shake.  She denies any chest pain, she denies any high fevers, she denies any tactile fevers at this time although she states she had a fever last week when this all started.  She denies any leg swelling, she has no exertional chest pain.  She denies any pain of any variety although sometimes is uncomfortable to be in the physical act of coughing.  Past Medical History:  Diagnosis Date  . Diabetes mellitus without complication (HCC)   . Hypertension     There are no active problems to display for this patient.   Past Surgical History:  Procedure Laterality Date  . ABDOMINAL HYSTERECTOMY      Prior to Admission medications   Medication Sig Start Date End Date Taking? Authorizing Provider  albuterol (PROVENTIL HFA;VENTOLIN HFA) 108 (90 Base) MCG/ACT inhaler Inhale 2 puffs into the lungs every 6 (six) hours as needed for wheezing or shortness of breath. 10/13/16   Willy Eddy, MD  atorvastatin (LIPITOR) 40 MG tablet Take 40 mg by mouth daily. 08/26/16   [provider]  glipiZIDE (GLUCOTROL) 5 MG tablet Take 5 mg by mouth daily before breakfast.    [provider]  HYDROcodone-acetaminophen (NORCO) 5-325 MG tablet Take 1 tablet by mouth every 4 (four) hours as needed for  moderate pain. Patient not taking: Reported on 02/25/2017 10/13/16   Willy Eddy, MD  HYDROcodone-acetaminophen (NORCO/VICODIN) 5-325 MG tablet Take 1 tablet by mouth every 4 (four) hours as needed for moderate pain. Patient not taking: Reported on 02/25/2017 11/14/15   Tommi Rumps, PA-C  meclizine (ANTIVERT) 25 MG tablet Take 1 tablet (25 mg total) 3 (three) times daily as needed by mouth for dizziness. 02/25/17   Myrna Blazer, MD    Allergies Penicillins  Family History  Problem Relation Age of Onset  . Breast cancer Mother 68    Social History Social History   Tobacco Use  . Smoking status: Current Every Day Smoker  . Smokeless tobacco: Never Used  Substance Use Topics  . Alcohol use: No    Frequency: Never  . Drug use: Not on file    Review of Systems Constitutional: No fever/chills Eyes: No visual changes. ENT: No sore throat. No stiff neck no neck pain Cardiovascular: Denies chest pain. Respiratory: Denies shortness of breath. Gastrointestinal:   no vomiting.  No diarrhea.  No constipation. Genitourinary: Negative for dysuria. Musculoskeletal: Negative lower extremity swelling Skin: Negative for rash. Neurological: Negative for severe headaches, focal weakness or numbness.   ____________________________________________   PHYSICAL EXAM:  VITAL SIGNS: ED Triage Vitals  Enc Vitals Group     BP 08/20/17 1345 (!) 157/104     Pulse Rate 08/20/17 1345 (!) 121     Resp 08/20/17 1345 (!) 30     Temp 08/20/17 1345 98.7 F (37.1 C)  Temp Source 08/20/17 1345 Oral     SpO2 08/20/17 1345 100 %     Weight 08/20/17 1346 149 lb (67.6 kg)     Height --      Head Circumference --      Peak Flow --      Pain Score 08/20/17 1346 0     Pain Loc --      Pain Edu? --      Excl. in GC? --     Constitutional: Alert and oriented.  She with cough and wheeze but otherwise nontoxic in appearance eyes: Conjunctivae are normal Head: Atraumatic HEENT: No  congestion/rhinnorhea. Mucous membranes are moist.  Oropharynx non-erythematous Neck:   Nontender with no meningismus, no masses, no stridor Chest: Tender palpation to the chest wall and a chest x-ray patient states "ouch that the pain right there" no lesions no flail chest no crepitus. Cardiovascular: Normal rate, regular rhythm. Grossly normal heart sounds.  Good peripheral circulation. Respiratory: Normal respiratory effort.  No retractions.  Use mild wheeze noted no rales or rhonchi Abdominal: Soft and nontender. No distention. No guarding no rebound Back:  There is no focal tenderness or step off.  there is no midline tenderness there are no lesions noted. there is no CVA tenderness} Musculoskeletal: No lower extremity tenderness, no upper extremity tenderness. No joint effusions, no DVT signs strong distal pulses no edema Neurologic:  Normal speech and language. No gross focal neurologic deficits are appreciated.  Skin:  Skin is warm, dry and intact. No rash noted. Psychiatric: Mood and affect are normal. Speech and behavior are normal.  ____________________________________________   LABS (all labs ordered are listed, but only abnormal results are displayed)  Labs Reviewed  BRAIN NATRIURETIC PEPTIDE  CBC WITH DIFFERENTIAL/PLATELET  TROPONIN I  BASIC METABOLIC PANEL    Pertinent labs  results that were available during my care of the patient were reviewed by me and considered in my medical decision making (see chart for details). ____________________________________________  EKG  I personally interpreted any EKGs ordered by me or triage Sinus tach rate 101 no acute ST elevation or depression normal axis unremarkable EKG ____________________________________________  RADIOLOGY  Pertinent labs & imaging results that were available during my care of the patient were reviewed by me and considered in my medical decision making (see chart for details). If possible, patient and/or  family made aware of any abnormal findings.  Personally reviewed x-ray  No results found. ____________________________________________    PROCEDURES  Procedure(s) performed: None  Procedures  Critical Care performed: None  ____________________________________________   INITIAL IMPRESSION / ASSESSMENT AND PLAN / ED COURSE  Pertinent labs & imaging results that were available during my care of the patient were reviewed by me and considered in my medical decision making (see chart for details).  Patient here with cough and wheeze, has had for a week.  History of COPD and tobacco abuse no known history of CHF.  Chest x-ray does not show anything aside from what we expect in terms of bronchitis given her smoking history, and likely COPD, we will send her home with prednisone, and antibiotics assuming she feels better here.  We have given her 2 treatments, after the first DuoNeb she did feel a great deal better.  Given her history, I did also do a troponin which is negative reassuringly EKG which is reassuringly negative BNP is pending but she has no stigmata of CHF.  ----------------------------------------- 4:26 PM on 08/20/2017 -----------------------------------------  Patient feeling much better  sats are 100%, breathing is much easier she would like to go home.  I will start her on azithromycin here.  We are waiting for BNP which if negative I think will allow [place] to discharge her.  Do not think serial enzymes are indicated given chronicity of complaint.  Return precautions follow-up given and understood We discussed admission but she would prefer to go home.    ____________________________________________   FINAL CLINICAL IMPRESSION(S) / ED DIAGNOSES  Final diagnoses:  None      This chart was dictated using voice recognition software.  Despite best efforts to proofread,  errors can occur which can change meaning.      Jeanmarie Plant, MD 08/20/17 1549    Jeanmarie Plant, MD 08/20/17 1626    Jeanmarie Plant, MD 08/20/17 [phone]

## 2017-08-20 NOTE — Discharge Instructions (Addendum)
Return to the emergency room for any new or worrisome symptoms including as of breath, or worsening symptoms.  Take the steroids until they are gone take the albuterol as prescribed, use the antibiotics until they are gone if you have high fever or shortness of breath or you feel worse or change your mind about admission please return to the emergency room for reassessment will close with primary care doctor.  Please stop smoking.

## 2017-08-20 NOTE — ED Notes (Signed)
Spoke extensively with pt on the need to stop smoking and on copd and bronchitis. rx's given with pt stating understanding to follow up with pcp

## 2017-08-20 NOTE — ED Notes (Signed)
bnp redrawn and sent

## 2017-08-20 NOTE — ED Notes (Signed)
Sats remained 100% while ambulating, pt tachycardic around 115-117. Dr. Alphonzo Lemmings notified. Pt will get a liter of fluid prior to discharge.

## 2017-08-20 NOTE — ED Notes (Signed)
Pt brought from xr to room =- pt had duoneb in triage and states feels better. Smokes with hx of bronchitis. Does not use inhaler on regular basis.

## 2017-08-20 NOTE — ED Triage Notes (Signed)
C/O cough and SOB x 1 week.  Patient is unable to speak in full sentences.. Strong cough.

## 2017-10-18 ENCOUNTER — Encounter: Payer: Self-pay | Admitting: Emergency Medicine

## 2017-10-18 ENCOUNTER — Emergency Department
Admission: EM | Admit: 2017-10-18 | Discharge: 2017-10-18 | Disposition: A | Discharge status: Home / Self Care | Payer: Self-pay | Attending: Emergency Medicine | Admitting: Emergency Medicine

## 2017-10-18 ENCOUNTER — Emergency Department: Payer: Self-pay

## 2017-10-18 DIAGNOSIS — I10 Essential (primary) hypertension: Secondary | ICD-10-CM | POA: Insufficient documentation

## 2017-10-18 DIAGNOSIS — X500XXA Overexertion from strenuous movement or load, initial encounter: Secondary | ICD-10-CM | POA: Insufficient documentation

## 2017-10-18 DIAGNOSIS — F17200 Nicotine dependence, unspecified, uncomplicated: Secondary | ICD-10-CM | POA: Insufficient documentation

## 2017-10-18 DIAGNOSIS — Y999 Unspecified external cause status: Secondary | ICD-10-CM | POA: Insufficient documentation

## 2017-10-18 DIAGNOSIS — E119 Type 2 diabetes mellitus without complications: Secondary | ICD-10-CM | POA: Insufficient documentation

## 2017-10-18 DIAGNOSIS — M62830 Muscle spasm of back: Secondary | ICD-10-CM | POA: Insufficient documentation

## 2017-10-18 DIAGNOSIS — Y929 Unspecified place or not applicable: Secondary | ICD-10-CM | POA: Insufficient documentation

## 2017-10-18 DIAGNOSIS — Y939 Activity, unspecified: Secondary | ICD-10-CM | POA: Insufficient documentation

## 2017-10-18 DIAGNOSIS — S39012A Strain of muscle, fascia and tendon of lower back, initial encounter: Secondary | ICD-10-CM | POA: Insufficient documentation

## 2017-10-18 DIAGNOSIS — Z79899 Other long term (current) drug therapy: Secondary | ICD-10-CM | POA: Insufficient documentation

## 2017-10-18 MED ORDER — KETOROLAC TROMETHAMINE 10 MG PO TABS: 10.0000 mg | ORAL_TABLET | Freq: Three times a day (TID) | ORAL | 0 refills | Status: DC

## 2017-10-18 MED ORDER — ORPHENADRINE CITRATE 30 MG/ML IJ SOLN
60.0000 mg | INTRAMUSCULAR | Status: AC
  Administered 2017-10-18: 60 mg via INTRAMUSCULAR
  Filled 2017-10-18: qty 2

## 2017-10-18 MED ORDER — CYCLOBENZAPRINE HCL 5 MG PO TABS: 5.0000 mg | ORAL_TABLET | Freq: Three times a day (TID) | ORAL | 0 refills | Status: DC | PRN

## 2017-10-18 MED ORDER — CYCLOBENZAPRINE HCL 5 MG PO TABS: 5.0000 mg | ORAL_TABLET | Freq: Three times a day (TID) | ORAL | 0 refills | Status: AC | PRN

## 2017-10-18 MED ORDER — KETOROLAC TROMETHAMINE 30 MG/ML IJ SOLN
30.0000 mg | Freq: Once | INTRAMUSCULAR | Status: AC
  Administered 2017-10-18: 30 mg via INTRAMUSCULAR
  Filled 2017-10-18: qty 1

## 2017-10-18 NOTE — ED Provider Notes (Signed)
[facility] Emergency Department Provider Note ____________________________________________  Time seen: 1135  I have reviewed the triage vital signs and the nursing notes.  HISTORY  Chief Complaint  Hip Pain  HPI Syndey [name] is a 62 y.o. female presents to the ED for evaluation of suddent right-sided low back pain.  Patient denies any preceding injury, accident, trauma.  According to the patient she awoke last night with pain in the low back and has had intermittent spasms since that time.  She denies any saddle anesthesia, footdrop, incontinence, or distal paresthesias.  She denies any history of ongoing or chronic low back pain.  Past Medical History:  Diagnosis Date  . Diabetes mellitus without complication (HCC)   . Hypertension     There are no active problems to display for this patient.   Past Surgical History:  Procedure Laterality Date  . ABDOMINAL HYSTERECTOMY      Prior to Admission medications   Medication Sig Start Date End Date Taking? Authorizing Provider  albuterol (PROVENTIL HFA;VENTOLIN HFA) 108 (90 Base) MCG/ACT inhaler Inhale 2 puffs into the lungs every 6 (six) hours as needed for wheezing or shortness of breath. 08/20/17   Jeanmarie Plant, MD  cyclobenzaprine (FLEXERIL) 5 MG tablet Take 1 tablet (5 mg total) by mouth 3 (three) times daily as needed for up to 5 days for muscle spasms. 10/18/17 10/23/17  Orene Abbasi, Charlesetta Ivory, PA-C  glipiZIDE (GLUCOTROL) 5 MG tablet Take 5 mg by mouth daily before breakfast.    [provider]  ketorolac (TORADOL) 10 MG tablet Take 1 tablet (10 mg total) by mouth every 8 (eight) hours. 10/18/17   Vaibhav Fogleman, Charlesetta Ivory, PA-C  meclizine (ANTIVERT) 25 MG tablet Take 1 tablet (25 mg total) 3 (three) times daily as needed by mouth for dizziness. Patient not taking: Reported on 08/20/2017 02/25/17   Myrna Blazer, MD  predniSONE (DELTASONE) 20 MG tablet Take 3 tablets (60 mg total) by mouth  daily. 08/20/17   Jeanmarie Plant, MD    Allergies Penicillins  Family History  Problem Relation Age of Onset  . Breast cancer Mother 33    Social History Social History   Tobacco Use  . Smoking status: Current Every Day Smoker  . Smokeless tobacco: Never Used  Substance Use Topics  . Alcohol use: No    Frequency: Never  . Drug use: Not on file    Review of Systems  Constitutional: Negative for fever. Cardiovascular: Negative for chest pain. Respiratory: Negative for shortness of breath. Gastrointestinal: Negative for abdominal pain, vomiting and diarrhea. Genitourinary: Negative for dysuria. Musculoskeletal: Positive for back pain. Skin: Negative for rash. Neurological: Negative for headaches, focal weakness or numbness. ____________________________________________  PHYSICAL EXAM:  VITAL SIGNS: ED Triage Vitals [10/18/17 1040]  Enc Vitals Group     BP (!) 122/97     Pulse Rate 95     Resp 16     Temp 98 F (36.7 C)     Temp Source Oral     SpO2 100 %     Weight 146 lb (66.2 kg)     Height 5\' 7"  (1.702 m)     Head Circumference      Peak Flow      Pain Score 10     Pain Loc      Pain Edu?      Excl. in GC?     Constitutional: Alert and oriented. Well appearing and in no distress. Head: Normocephalic  and atraumatic. Cardiovascular: Normal rate, regular rhythm. Normal distal pulses. Respiratory: Normal respiratory effort. No wheezes/rales/rhonchi. Gastrointestinal: Soft and nontender. No distention.  No CVA tenderness appreciated. Musculoskeletal: No spinal alignment without midline tenderness, spasm, deformity, or step-off.  Tenderness to palpation to the right lumbar sacral junction.  Patient transitions slowly from sit to stand.  Normal lower extremity strength and resistance testing bilaterally.  Nontender with normal range of motion in all extremities.  Neurologic: Mildly antalgic gait without ataxia.  Normal LE DTRs bilaterally.  Normal toe  dorsiflexion and foot eversion on exam.  Normal speech and language. No gross focal neurologic deficits are appreciated. Skin:  Skin is warm, dry and intact. No rash noted. ____________________________________________   RADIOLOGY  Lumbar Spine IMPRESSION: Normal alignment.  Normal intervertebral disc spaces. ____________________________________________  PROCEDURES  Procedures Toradol 30 mg IM Norflex 60 mg IM ____________________________________________  INITIAL IMPRESSION / ASSESSMENT AND PLAN / ED COURSE  ED evaluation of sudden low back pain and spasm.  Patient's exam is more consistent with a lumbar sacral strain and her initial complaint of hip pain.  She localizes pain to the right lumbar sacral junction.  X-rays are negative for any acute fractures or dislocation.  Patient reports improvement in her pain and OT following IM medication ministration.  She will be discharged with instructions on management of her symptoms.  Prescriptions for cyclobenzaprine and ketorolac are provided for her benefit.  Return precautions have been reviewed. ____________________________________________  FINAL CLINICAL IMPRESSION(S) / ED DIAGNOSES  Final diagnoses:  Lumbar strain, initial encounter  Back muscle spasm      [name], Charlesetta Ivory, PA-C 10/18/17 1830    Minna Antis, MD 10/18/17 2026

## 2017-10-18 NOTE — ED Notes (Signed)
See triage note  Presents with lower back pain  Mainly at right hip area which is moving into right leg  Felt slight pain yesterday  But pain increased with turning in bed

## 2017-10-18 NOTE — Discharge Instructions (Addendum)
Your exam and x-rays are normal today. Take the prescription meds as directed. Apply ice and/or moist heat to reduce pain and swelling. Follow-up with [facility] or return for worsening symptoms.

## 2017-10-18 NOTE — ED Triage Notes (Signed)
Pt arrived with complaints of right hip pain. Pt denies any injury. Pt states she was asleep last night and woke up in the middle of the night with pain.

## 2018-06-13 ENCOUNTER — Other Ambulatory Visit: Payer: Self-pay

## 2018-06-13 ENCOUNTER — Emergency Department: Payer: Self-pay

## 2018-06-13 ENCOUNTER — Encounter: Payer: Self-pay | Admitting: Emergency Medicine

## 2018-06-13 ENCOUNTER — Inpatient Hospital Stay
Admission: EM | Admit: 2018-06-13 | Discharge: 2018-06-16 | DRG: 384 | Disposition: A | Discharge status: Home / Self Care | Payer: Self-pay | Attending: Internal Medicine | Admitting: Internal Medicine

## 2018-06-13 DIAGNOSIS — R112 Nausea with vomiting, unspecified: Secondary | ICD-10-CM

## 2018-06-13 DIAGNOSIS — E860 Dehydration: Secondary | ICD-10-CM | POA: Diagnosis present

## 2018-06-13 DIAGNOSIS — Z880 Allergy status to penicillin: Secondary | ICD-10-CM

## 2018-06-13 DIAGNOSIS — F17200 Nicotine dependence, unspecified, uncomplicated: Secondary | ICD-10-CM | POA: Diagnosis present

## 2018-06-13 DIAGNOSIS — E872 Acidosis, unspecified: Secondary | ICD-10-CM

## 2018-06-13 DIAGNOSIS — Z803 Family history of malignant neoplasm of breast: Secondary | ICD-10-CM

## 2018-06-13 DIAGNOSIS — K2900 Acute gastritis without bleeding: Secondary | ICD-10-CM | POA: Diagnosis present

## 2018-06-13 DIAGNOSIS — R197 Diarrhea, unspecified: Secondary | ICD-10-CM

## 2018-06-13 DIAGNOSIS — I10 Essential (primary) hypertension: Secondary | ICD-10-CM | POA: Diagnosis present

## 2018-06-13 DIAGNOSIS — K269 Duodenal ulcer, unspecified as acute or chronic, without hemorrhage or perforation: Principal | ICD-10-CM | POA: Diagnosis present

## 2018-06-13 DIAGNOSIS — Z7984 Long term (current) use of oral hypoglycemic drugs: Secondary | ICD-10-CM

## 2018-06-13 DIAGNOSIS — E119 Type 2 diabetes mellitus without complications: Secondary | ICD-10-CM

## 2018-06-13 DIAGNOSIS — E785 Hyperlipidemia, unspecified: Secondary | ICD-10-CM | POA: Diagnosis present

## 2018-06-13 DIAGNOSIS — R7989 Other specified abnormal findings of blood chemistry: Secondary | ICD-10-CM | POA: Diagnosis present

## 2018-06-13 DIAGNOSIS — I70219 Atherosclerosis of native arteries of extremities with intermittent claudication, unspecified extremity: Secondary | ICD-10-CM | POA: Diagnosis present

## 2018-06-13 DIAGNOSIS — R109 Unspecified abdominal pain: Secondary | ICD-10-CM

## 2018-06-13 DIAGNOSIS — E1151 Type 2 diabetes mellitus with diabetic peripheral angiopathy without gangrene: Secondary | ICD-10-CM | POA: Diagnosis present

## 2018-06-13 DIAGNOSIS — Z7952 Long term (current) use of systemic steroids: Secondary | ICD-10-CM

## 2018-06-13 DIAGNOSIS — Z23 Encounter for immunization: Secondary | ICD-10-CM

## 2018-06-13 LAB — LIPASE, BLOOD: Lipase: 25 U/L (ref 11–51)

## 2018-06-13 LAB — HEPATIC FUNCTION PANEL
ALT: 13 U/L (ref 0–44)
AST: 15 U/L (ref 15–41)
Albumin: 4.4 g/dL (ref 3.5–5.0)
Alkaline Phosphatase: 94 U/L (ref 38–126)
Bilirubin, Direct: 0.1 mg/dL (ref 0.0–0.2)
Indirect Bilirubin: 0.9 mg/dL (ref 0.3–0.9)
Total Bilirubin: 1 mg/dL (ref 0.3–1.2)
Total Protein: 8.1 g/dL (ref 6.5–8.1)

## 2018-06-13 LAB — BASIC METABOLIC PANEL
Anion gap: 10 (ref 5–15)
BUN: 11 mg/dL (ref 8–23)
CO2: 22 mmol/L (ref 22–32)
Calcium: 10 mg/dL (ref 8.9–10.3)
Chloride: 107 mmol/L (ref 98–111)
Creatinine, Ser: 0.8 mg/dL (ref 0.44–1.00)
GFR calc Af Amer: >60 mL/min (ref >60)
GFR calc non Af Amer: >60 mL/min (ref >60)
Glucose, Bld: 288 mg/dL — ABNORMAL HIGH (ref 70–99)
Potassium: 3.8 mmol/L (ref 3.5–5.1)
Sodium: 139 mmol/L (ref 135–145)

## 2018-06-13 LAB — URINALYSIS, COMPLETE (UACMP) WITH MICROSCOPIC
Bacteria, UA: NONE SEEN
Bilirubin Urine: NEGATIVE
Glucose, UA: >=500 mg/dL — AB
Ketones, ur: NEGATIVE mg/dL
Leukocytes,Ua: NEGATIVE
Nitrite: NEGATIVE
Protein, ur: 100 mg/dL — AB
Specific Gravity, Urine: 1.026 (ref 1.005–1.030)
pH: 6 (ref 5.0–8.0)

## 2018-06-13 LAB — CBC
HCT: 34.8 % — ABNORMAL LOW (ref 36.0–46.0)
Hemoglobin: 12.3 g/dL (ref 12.0–15.0)
MCH: 29 pg (ref 26.0–34.0)
MCHC: 35.3 g/dL (ref 30.0–36.0)
MCV: 82.1 fL (ref 80.0–100.0)
Platelets: 328 10*3/uL (ref 150–400)
RBC: 4.24 MIL/uL (ref 3.87–5.11)
RDW: 13.1 % (ref 11.5–15.5)
WBC: 10.5 10*3/uL (ref 4.0–10.5)
nRBC: 0 % (ref 0.0–0.2)

## 2018-06-13 LAB — TROPONIN I: Troponin I: <0.03 ng/mL (ref <0.03)

## 2018-06-13 LAB — LACTIC ACID, PLASMA
Lactic Acid, Venous: 4.2 mmol/L (ref 0.5–1.9)
Lactic Acid, Venous: 1.6 mmol/L (ref 0.5–1.9)

## 2018-06-13 LAB — GLUCOSE, CAPILLARY: Glucose-Capillary: 288 mg/dL — ABNORMAL HIGH (ref 70–99)

## 2018-06-13 MED ORDER — MORPHINE SULFATE (PF) 4 MG/ML IV SOLN
4.0000 mg | INTRAVENOUS | Status: DC | PRN
  Administered 2018-06-14 – 2018-06-15 (×5): 4 mg via INTRAVENOUS
  Filled 2018-06-13 (×5): qty 1

## 2018-06-13 MED ORDER — ALBUTEROL SULFATE (2.5 MG/3ML) 0.083% IN NEBU: 2.5000 mg | INHALATION_SOLUTION | Freq: Four times a day (QID) | RESPIRATORY_TRACT | Status: DC | PRN

## 2018-06-13 MED ORDER — MORPHINE SULFATE (PF) 4 MG/ML IV SOLN
4.0000 mg | Freq: Once | INTRAVENOUS | Status: AC
  Administered 2018-06-13: 4 mg via INTRAVENOUS
  Filled 2018-06-13: qty 1

## 2018-06-13 MED ORDER — ENOXAPARIN SODIUM 40 MG/0.4ML ~~LOC~~ SOLN
40.0000 mg | SUBCUTANEOUS | Status: DC
  Administered 2018-06-15 (×2): 40 mg via SUBCUTANEOUS
  Filled 2018-06-13 (×2): qty 0.4

## 2018-06-13 MED ORDER — MORPHINE SULFATE (PF) 4 MG/ML IV SOLN
INTRAVENOUS | Status: AC
  Administered 2018-06-13: 4 mg via INTRAVENOUS
  Filled 2018-06-13: qty 1

## 2018-06-13 MED ORDER — ACETAMINOPHEN 650 MG RE SUPP: 650.0000 mg | Freq: Four times a day (QID) | RECTAL | Status: DC | PRN

## 2018-06-13 MED ORDER — ONDANSETRON HCL 4 MG/2ML IJ SOLN
4.0000 mg | Freq: Four times a day (QID) | INTRAMUSCULAR | Status: DC | PRN
  Administered 2018-06-14 (×2): 4 mg via INTRAVENOUS
  Filled 2018-06-13 (×2): qty 2

## 2018-06-13 MED ORDER — PROMETHAZINE HCL 25 MG/ML IJ SOLN: 12.5000 mg | Freq: Four times a day (QID) | INTRAMUSCULAR | Status: DC | PRN

## 2018-06-13 MED ORDER — IOHEXOL 300 MG/ML  SOLN
100.0000 mL | Freq: Once | INTRAMUSCULAR | Status: AC | PRN
  Administered 2018-06-13: 100 mL via INTRAVENOUS

## 2018-06-13 MED ORDER — MORPHINE SULFATE (PF) 4 MG/ML IV SOLN
4.0000 mg | Freq: Once | INTRAVENOUS | Status: AC
  Administered 2018-06-13: 4 mg via INTRAVENOUS

## 2018-06-13 MED ORDER — INSULIN ASPART 100 UNIT/ML ~~LOC~~ SOLN
0.0000 [IU] | Freq: Every day | SUBCUTANEOUS | Status: DC
  Administered 2018-06-14: 2 [IU] via SUBCUTANEOUS
  Filled 2018-06-13: qty 1

## 2018-06-13 MED ORDER — INSULIN ASPART 100 UNIT/ML ~~LOC~~ SOLN
0.0000 [IU] | Freq: Three times a day (TID) | SUBCUTANEOUS | Status: DC
  Administered 2018-06-14: 1 [IU] via SUBCUTANEOUS
  Administered 2018-06-14 – 2018-06-15 (×2): 2 [IU] via SUBCUTANEOUS
  Administered 2018-06-16: 1 [IU] via SUBCUTANEOUS
  Filled 2018-06-13 (×4): qty 1

## 2018-06-13 MED ORDER — IOPAMIDOL (ISOVUE-300) INJECTION 61%
30.0000 mL | Freq: Once | INTRAVENOUS | Status: AC | PRN
  Administered 2018-06-13: 30 mL via ORAL

## 2018-06-13 MED ORDER — SODIUM CHLORIDE 0.9% FLUSH: 3.0000 mL | Freq: Once | INTRAVENOUS | Status: DC

## 2018-06-13 MED ORDER — SODIUM CHLORIDE 0.9 % IV SOLN
INTRAVENOUS | Status: AC
  Administered 2018-06-14: via INTRAVENOUS

## 2018-06-13 MED ORDER — PROMETHAZINE HCL 25 MG/ML IJ SOLN
6.2500 mg | Freq: Once | INTRAMUSCULAR | Status: AC
  Administered 2018-06-13: 6.25 mg via INTRAVENOUS

## 2018-06-13 MED ORDER — SODIUM CHLORIDE 0.9 % IV BOLUS
1000.0000 mL | Freq: Once | INTRAVENOUS | Status: AC
  Administered 2018-06-13: 1000 mL via INTRAVENOUS

## 2018-06-13 MED ORDER — FAMOTIDINE 20 MG PO TABS
20.0000 mg | ORAL_TABLET | Freq: Every day | ORAL | Status: DC
  Administered 2018-06-14 – 2018-06-16 (×3): 20 mg via ORAL
  Filled 2018-06-13 (×3): qty 1

## 2018-06-13 MED ORDER — SODIUM CHLORIDE 0.9 % IV BOLUS
500.0000 mL | Freq: Once | INTRAVENOUS | Status: AC
  Administered 2018-06-13: 500 mL via INTRAVENOUS

## 2018-06-13 MED ORDER — ONDANSETRON HCL 4 MG PO TABS: 4.0000 mg | ORAL_TABLET | Freq: Four times a day (QID) | ORAL | Status: DC | PRN

## 2018-06-13 MED ORDER — ACETAMINOPHEN 325 MG PO TABS: 650.0000 mg | ORAL_TABLET | Freq: Four times a day (QID) | ORAL | Status: DC | PRN

## 2018-06-13 NOTE — ED Notes (Signed)
Pt requesting more pain medication. MD made aware

## 2018-06-13 NOTE — ED Notes (Signed)
Pt denies any pain or nausea at this time.

## 2018-06-13 NOTE — H&P (Signed)
[facility] -  at [facility]   PATIENT NAME: Sylvia [name]    MR#:  [id number]  DATE OF BIRTH:  [birth year]  DATE OF ADMISSION:  06/13/2018  PRIMARY CARE PHYSICIAN: [facility]   REQUESTING/REFERRING PHYSICIAN: Alphonzo Lemmings, MD  CHIEF COMPLAINT:   Chief Complaint  Patient presents with  . Weakness  . Abdominal Pain    HISTORY OF PRESENT ILLNESS:  Sylvia [name]  is a 63 y.o. female who presents with chief complaint as above.  Patient presents to the ED with a complaint of abdominal pain.  She states that she has had this pain for between 36 to 48 hours.  Her pain is epigastric, sharp.  It is associated with nausea and profuse vomiting over the last 24 hours.  She states that she has been told in the past she had an ulcer, but states that she has never had endoscopy performed.  Here in the ED she was found to have an elevated lactic acid, greater than 4.  On CTA imaging she also was found to have significant mid aortic and femoral stenosis due to calcification and plaque.  Hospitalist were called for admission and further evaluation  PAST MEDICAL HISTORY:   Past Medical History:  Diagnosis Date  . Diabetes mellitus without complication (HCC)   . Hypertension      PAST SURGICAL HISTORY:   Past Surgical History:  Procedure Laterality Date  . ABDOMINAL HYSTERECTOMY       SOCIAL HISTORY:   Social History   Tobacco Use  . Smoking status: Current Every Day Smoker  . Smokeless tobacco: Never Used  Substance Use Topics  . Alcohol use: No    Frequency: Never     FAMILY HISTORY:   Family History  Problem Relation Age of Onset  . Breast cancer Mother 35     DRUG ALLERGIES:   Allergies  Allergen Reactions  . Penicillins Hives    Has patient had a PCN reaction causing immediate rash, facial/tongue/throat swelling, SOB or lightheadedness with hypotension: Yes Has patient had a PCN reaction causing severe  rash involving mucus membranes or skin necrosis: No Has patient had a PCN reaction that required hospitalization: No Has patient had a PCN reaction occurring within the last 10 years: No If all of the above answers are "NO", then may proceed with Cephalosporin use.    MEDICATIONS AT HOME:   Prior to Admission medications   Medication Sig Start Date End Date Taking? Authorizing Provider  albuterol (PROVENTIL HFA;VENTOLIN HFA) 108 (90 Base) MCG/ACT inhaler Inhale 2 puffs into the lungs every 6 (six) hours as needed for wheezing or shortness of breath. 08/20/17  Yes Jeanmarie Plant, MD  ranitidine (ZANTAC) 150 MG tablet Take 150 mg by mouth daily.   Yes [provider]    REVIEW OF SYSTEMS:  Review of Systems  Constitutional: Negative for chills, fever, malaise/fatigue and weight loss.  HENT: Negative for ear pain, hearing loss and tinnitus.   Eyes: Negative for blurred vision, double vision, pain and redness.  Respiratory: Negative for cough, hemoptysis and shortness of breath.   Cardiovascular: Negative for chest pain, palpitations, orthopnea and leg swelling.  Gastrointestinal: Positive for abdominal pain, nausea and vomiting. Negative for constipation and diarrhea.  Genitourinary: Negative for dysuria, frequency and hematuria.  Musculoskeletal: Negative for back pain, joint pain and neck pain.  Skin:       No acne, rash, or lesions  Neurological: Negative for dizziness, tremors, focal  weakness and weakness.  Endo/Heme/Allergies: Negative for polydipsia. Does not bruise/bleed easily.  Psychiatric/Behavioral: Negative for depression. The patient is not nervous/anxious and does not have insomnia.      VITAL SIGNS:   Vitals:   06/13/18 1900 06/13/18 1953 06/13/18 2000 06/13/18 2030  BP: (!) 199/116 (!) 169/80 (!) 202/82 (!) 200/87  Pulse: 100 94 90 100  Resp: 17 16 (!) 22 (!) 23  Temp:      TempSrc:      SpO2: 100% 99% 98% 100%  Weight:      Height:       Wt Readings  from Last 3 Encounters:  06/13/18 64 kg  10/18/17 66.2 kg  08/20/17 67.6 kg    PHYSICAL EXAMINATION:  Physical Exam  Vitals reviewed. Constitutional: She is oriented to person, place, and time. She appears well-developed and well-nourished. No distress.  HENT:  Head: Normocephalic and atraumatic.  Dry mucous membranes  Eyes: Pupils are equal, round, and reactive to light. Conjunctivae and EOM are normal. No scleral icterus.  Neck: Normal range of motion. Neck supple. No JVD present. No thyromegaly present.  Cardiovascular: Normal rate, regular rhythm and intact distal pulses. Exam reveals no gallop and no friction rub.  No murmur heard. Respiratory: Effort normal and breath sounds normal. No respiratory distress. She has no wheezes. She has no rales.  GI: Soft. Bowel sounds are normal. She exhibits no distension. There is abdominal tenderness (Epigastric).  Musculoskeletal: Normal range of motion.        General: No edema.     Comments: No arthritis, no gout  Lymphadenopathy:    She has no cervical adenopathy.  Neurological: She is alert and oriented to person, place, and time. No cranial nerve deficit.  No dysarthria, no aphasia  Skin: Skin is warm and dry. No rash noted. No erythema.  Psychiatric: She has a normal mood and affect. Her behavior is normal. Judgment and thought content normal.    LABORATORY PANEL:   CBC Recent Labs  Lab 06/13/18 1313  WBC 10.5  HGB 12.3  HCT 34.8*  PLT 328   ------------------------------------------------------------------------------------------------------------------  Chemistries  Recent Labs  Lab 06/13/18 1313  NA 139  K 3.8  CL 107  CO2 22  GLUCOSE 288*  BUN 11  CREATININE 0.80  CALCIUM 10.0  AST 15  ALT 13  ALKPHOS 94  BILITOT 1.0   ------------------------------------------------------------------------------------------------------------------  Cardiac Enzymes Recent Labs  Lab 06/13/18 1313  TROPONINI <0.03    ------------------------------------------------------------------------------------------------------------------  RADIOLOGY:  Ct Abdomen Pelvis W Contrast  Result Date: 06/13/2018 CLINICAL DATA:  Acute onset of generalized abdominal pain, nausea and vomiting that began earlier today. EXAM: CT ABDOMEN AND PELVIS WITH CONTRAST TECHNIQUE: Multidetector CT imaging of the abdomen and pelvis was performed using the standard protocol following bolus administration of intravenous contrast. CONTRAST:  <MEASUREMEC763-050AJackelDOdessa FlemL1Corine Sh158G<MEASUREMENTC31440KitteryJackelDOdessa FlemL1Corine Sh15<MEASUREMENTC830-227GJackelDOdessa FlemL1Corine Sh158<MEASUREMENTC260-627GrandJackelDOdessa FlemL1Corine Sh158G<MEASUREMENTC858 654AJackelDOdessa FlemL1Corine Sh158Geof<MEASUREMENTC98917SuJackelDOdessa FlemL1Corine Sh158Ge<MEASUREMENTC218-798ShJackelDOdessa FlemL1Corine Sh158Geo<MEASUREMENTC(346) 784ChippewaJackelDOdessa FlemL1Corine Sh158G<MEASUREMENTC(269) 652MiJackelDOdessa FlemL1Corine Sh158Ge<MEASUREMENTC330 057BerrJackelDOdessa FlemL1Corine Sh158G<MEASUREMENTC518-076New EdJackelDOdessa FlemL1Corine Sh158<MEASUREMBainJackelDOdessa F1Corine ShGeoffeT<MEASUREMENTC610-426CaJackelDOdessa FlemL1Corine Sh158GeoffeJoh<MEASUREMENTC(804)358PiJackelDOdessa FlemL1Corine Sh158Geoff<MEASUREMENTC(603)828CouJackelDOdessa FlemL1Corine Sh158G<MEASUREMENTC417-681WJackelDOdessa FlemL1Corine Sh158Geo<MEASUREMENTC70458GreenJackelDOdessa FlemL1Corine Sh158Ge<MEASUREMENTC757-088JackelDOdessa FlemL1Corine Sh158G<MEASUREMENTC931-327GiJackelDOdessa FlemL1Corine Sh158<MEASUREMENTC630 410PJackelDOdessa FlemL1Corine Sh158Geoff<MEASUREMENTC(606)881BluJackelDOdessa FlemL1Corine Sh158<MEASUREMENTC757-878JackelDOdessa FlemL1Corine Sh158GeoffeCroydonatarelEXOL 300 MG/ML IV. Oral contrast was also administered. COMPARISON:  Unenhanced CT abdomen and pelvis 08/30/2006. FINDINGS: Lower chest: Scarring in the RIGHT MIDDLE LOBE. Emphysematous changes in visualized lung bases. LEFT LOWER LOBE bronchiectasis. Heart size upper normal. Hepatobiliary: Mild diffuse hepatic steatosis without focal hepatic parenchymal abnormality. Surgically absent gallbladder. No unexpected biliary ductal dilation. Pancreas: Mildly atrophic without evidence of mass or peripancreatic inflammation. Spleen: Normal in size and appearance. Adrenals/Urinary Tract: Normal appearing adrenal glands. No urinary tract calculi. No hydronephrosis. Benign 8 mm cyst involving the UPPER pole of the RIGHT kidney posterolaterally with a Hounsfield measurement of 20. No focal parenchymal abnormality elsewhere in either kidney.  Normal appearing mildly distended urinary bladder. Stomach/Bowel: Stomach normal in appearance for the degree of distention. Normal-appearing small bowel. Ascending colon, transverse colon and descending colon decompressed. Expected stool burden in the sigmoid colon and rectum. Scattered diverticula throughout the colon without evidence of acute diverticulitis. Normal appendix in the RIGHT mid and UPPER pelvis. Vascular/Lymphatic: Severe atherosclerosis involving the abdominal aorta and the iliofemoral arteries with calcified and noncalcified plaque. Stenosis involving the  infrarenal abdominal aorta. Atherosclerosis at the origins of the celiac and SMA without visible stenosis. Atherosclerosis at the origins of the renal arteries. Possible hemodynamically significant stenosis involving the LEFT common femoral artery. No pathologic lymphadenopathy. Reproductive: Surgically absent uterus. No adnexal masses. Other: None. Musculoskeletal: Facet degenerative changes involving the LOWER lumbar spine. Mild degenerative changes in both hips. No acute findings. IMPRESSION: 1. No acute abnormalities involving the abdomen or pelvis. 2. Mild diffuse hepatic steatosis without focal hepatic parenchymal abnormality. 3. Severe generalized atherosclerosis with calcified and noncalcified plaque. 4. Possible significant stenosis involving the mid aorta. Possible significant stenosis involving the LEFT common femoral artery. Mild stenosis at the origin of the SMA which does not appear critical. 5.  Aortic Atherosclerosis, severe.  (ICD10-170.0) 6. Scattered colonic diverticula without evidence of acute diverticulitis. Results were discussed directly by telephone with Dr. Alphonzo LemmingsMcShane of the emergency department at the time of interpretation on 06/13/2018 at 7:55 p.m. Electronically Signed   By: Hulan Saashomas  Lawrence M.D.   On: 06/13/2018 19:57    EKG:   Orders placed or performed during the hospital encounter of 06/13/18  . ED EKG  . ED EKG    IMPRESSION AND PLAN:  Principal Problem:   Abdominal pain -I suspect the patient's abdominal pain is more related to a gastric process, possibly an ulcer, as opposed to significant ischemia from her arterial disease.  However, she does have significant mid aortic and femoral arterial disease.  We will get a vascular surgery consult for further evaluation of this problem.  We will also get a gastroenterology consult for evaluation of likely ulceration. Active Problems:   Elevated lactic acid level -patient had profound vomiting for the last 24 hours.  I suspect  her lactic acid was significantly elevated due to the same.  She got IV fluids here in the ED and her lactic acid corrected to normal.   HTN (hypertension) -continue home dose antihypertensives   Diabetes (HCC) -sliding scale insulin coverage  Chart review performed and case discussed with ED provider. Labs, imaging and/or ECG reviewed by provider and discussed with patient/family. Management plans discussed with the patient and/or family.  DVT PROPHYLAXIS: SubQ Lovenox  GI PROPHYLAXIS:  None  ADMISSION STATUS: Inpatient     CODE STATUS: Full  TOTAL TIME TAKING CARE OF THIS PATIENT: 45 minutes.   Barney DrainDavid F Takiera Mayo 06/13/2018, 9:08 PM  [facility]  Office  [phone]  CC: Primary care physician; [facility]  Note:  This document was prepared using Conservation officer, historic buildingsDragon voice recognition software and may include unintentional dictation errors.

## 2018-06-13 NOTE — ED Notes (Signed)
Hospitalist in to admit 

## 2018-06-13 NOTE — ED Notes (Signed)
Report received from [facility]. Patient care assumed. Patient/RN introduction complete.

## 2018-06-13 NOTE — ED Triage Notes (Signed)
Pt reports N/V x 1 week. Pt states she has been unable to keep fluids down or eat. Pt c/o weakness at this time. Pt also c/o new onset diarrhea. Pt c/o lower abdominal pain at this time.

## 2018-06-13 NOTE — ED Notes (Signed)
Pt returned from CT °

## 2018-06-13 NOTE — ED Notes (Signed)
Pt given meal tray and juice, ok per Dr. Anne Hahn. Informed pt that she can't eat after midnight per Dr. Anne Hahn.   Ambulatory to toilet. Hat placed in toilet to collect urine sample.

## 2018-06-13 NOTE — ED Notes (Signed)
Date and time results received: 06/13/18 1848   Test: lactic acid Critical Value: 4.2  Name of Provider Notified: Dr. Alphonzo Lemmings  Orders Received? Or Actions Taken?: verbal order to hang a 1L of NS

## 2018-06-13 NOTE — ED Provider Notes (Addendum)
[facility] Emergency Department Provider Note  ____________________________________________   I have reviewed the triage vital signs and the nursing notes. Where available I have reviewed prior notes and, if possible and indicated, outside hospital notes.    HISTORY  Chief Complaint Weakness and Abdominal Pain    HPI Sylvia [name] is a 63 y.o. female  Who presents today complaining of abdominal pain nausea and vomiting.  Denies any melena or bright red blood per rectum or hematemesis.  Symptoms started about 10 hours ago.  She states she has had multiple episodes of vomiting.  She has not had any focal dental pain but her "whole abdomen hurts".  When I asked her to be more specific she states it is mostly in the epigastric region and indicates her epigastric region with her hand.  She has had no fever no chills, no other alleviating or aggravating factors no prior treatment no chest pain no shortness of breath no exertional symptoms, no other antecedent intervention.  Pain is a diffuse "achy discomfort".    Past Medical History:  Diagnosis Date  . Diabetes mellitus without complication (HCC)   . Hypertension     There are no active problems to display for this patient.   Past Surgical History:  Procedure Laterality Date  . ABDOMINAL HYSTERECTOMY      Prior to Admission medications   Medication Sig Start Date End Date Taking? Authorizing Provider  albuterol (PROVENTIL HFA;VENTOLIN HFA) 108 (90 Base) MCG/ACT inhaler Inhale 2 puffs into the lungs every 6 (six) hours as needed for wheezing or shortness of breath. 08/20/17   Jeanmarie Plant, MD  glipiZIDE (GLUCOTROL) 5 MG tablet Take 5 mg by mouth daily before breakfast.    [provider]  ketorolac (TORADOL) 10 MG tablet Take 1 tablet (10 mg total) by mouth every 8 (eight) hours. 10/18/17   Menshew, Charlesetta Ivory, PA-C  meclizine (ANTIVERT) 25 MG tablet Take 1 tablet (25 mg total) 3 (three)  times daily as needed by mouth for dizziness. Patient not taking: Reported on 08/20/2017 02/25/17   Myrna Blazer, MD  predniSONE (DELTASONE) 20 MG tablet Take 3 tablets (60 mg total) by mouth daily. 08/20/17   Jeanmarie Plant, MD    Allergies Penicillins  Family History  Problem Relation Age of Onset  . Breast cancer Mother 59    Social History Social History   Tobacco Use  . Smoking status: Current Every Day Smoker  . Smokeless tobacco: Never Used  Substance Use Topics  . Alcohol use: No    Frequency: Never  . Drug use: Not on file    Review of Systems Constitutional: No fever/chills Eyes: No visual changes. ENT: No sore throat. No stiff neck no neck pain Cardiovascular: Denies chest pain. Respiratory: Denies shortness of breath. Gastrointestinal: Positive nausea and vomiting Genitourinary: Negative for dysuria. Musculoskeletal: Negative lower extremity swelling Skin: Negative for rash. Neurological: Negative for severe headaches, focal weakness or numbness.   ____________________________________________   PHYSICAL EXAM:  VITAL SIGNS: ED Triage Vitals [06/13/18 1306]  Enc Vitals Group     BP (!) 165/79     Pulse Rate 89     Resp 14     Temp 98.4 F (36.9 C)     Temp Source Oral     SpO2 100 %     Weight 141 lb (64 kg)     Height 5\' 6"  (1.676 m)     Head Circumference  Peak Flow      Pain Score 9     Pain Loc      Pain Edu?      Excl. in GC?     Constitutional: Alert and oriented.  She is in no acute distress but it appears if she does not feel well Eyes: Conjunctivae are normal Head: Atraumatic HEENT: No congestion/rhinnorhea. Mucous membranes are moist.  Oropharynx non-erythematous Neck:   Nontender with no meningismus, no masses, no stridor Cardiovascular: Normal rate, regular rhythm. Grossly normal heart sounds.  Good peripheral circulation. Respiratory: Normal respiratory effort.  No retractions. Lungs CTAB. Abdominal: Soft and  Fuhs tenderness with no surgical signs mostly present epigastric region, voluntary guarding no rebound. No distention.  Back:  There is no focal tenderness or step off.  there is no midline tenderness there are no lesions noted. there is no CVA tenderness Musculoskeletal: No lower extremity tenderness, no upper extremity tenderness. No joint effusions, no DVT signs strong distal pulses no edema Neurologic:  Normal speech and language. No gross focal neurologic deficits are appreciated.  Skin:  Skin is warm, dry and intact. No rash noted. Psychiatric: Mood and affect are normal. Speech and behavior are normal.  ____________________________________________   LABS (all labs ordered are listed, but only abnormal results are displayed)  Labs Reviewed  BASIC METABOLIC PANEL - Abnormal; Notable for the following components:      Result Value   Glucose, Bld 288 (*)    All other components within normal limits  CBC - Abnormal; Notable for the following components:   HCT 34.8 (*)    All other components within normal limits  GLUCOSE, CAPILLARY - Abnormal; Notable for the following components:   Glucose-Capillary 288 (*)    All other components within normal limits  LACTIC ACID, PLASMA - Abnormal; Notable for the following components:   Lactic Acid, Venous 4.2 (*)    All other components within normal limits  HEPATIC FUNCTION PANEL  LIPASE, BLOOD  TROPONIN I  URINALYSIS, COMPLETE (UACMP) WITH MICROSCOPIC  LACTIC ACID, PLASMA  CBG MONITORING, ED    Pertinent labs  results that were available during my care of the patient were reviewed by me and considered in my medical decision making (see chart for details). ____________________________________________  EKG  I personally interpreted any EKGs ordered by me or triage Normal sinus rhythm rate 89 bpm no acute ST elevation or depression, LVH noted, nonspecific ST changes no acute ischemia TC is borderline prolonged at  493 ____________________________________________  RADIOLOGY  Pertinent labs & imaging results that were available during my care of the patient were reviewed by me and considered in my medical decision making (see chart for details). If possible, patient and/or family made aware of any abnormal findings.  No results found. ____________________________________________    PROCEDURES  Procedure(s) performed: None  Procedures  Critical Care performed: CRITICAL CARE Performed by: Jeanmarie Plant   Total critical care time: 38 minutes  Critical care time was exclusive of separately billable procedures and treating other patients.  Critical care was necessary to treat or prevent imminent or life-threatening deterioration.  Critical care was time spent personally by me on the following activities: development of treatment plan with patient and/or surrogate as well as nursing, discussions with consultants, evaluation of patient's response to treatment, examination of patient, obtaining history from patient or surrogate, ordering and performing treatments and interventions, ordering and review of laboratory studies, ordering and review of radiographic studies, pulse oximetry and re-evaluation of  patient's condition.   ____________________________________________   INITIAL IMPRESSION / ASSESSMENT AND PLAN / ED COURSE  Pertinent labs & imaging results that were available during my care of the patient were reviewed by me and considered in my medical decision making (see chart for details).  Patient here with nausea vomiting diarrhea, she also has some diffuse abdominal pain mostly in the epigastric region, has no rebound but is tender.  I do not think it is technically "pain out of proportion to exam", they both seem commensurate however, given her age I did send a lactic which is somewhat elevated.  This is a nonspecific finding.  In the right circumstance and can be ischemic gut we are  ordering a CT scan, near volume depletion or other stressors can cause elevated lactic so it is certainly not something specific.  Giving her IV fluids we have no evidence of CHF on history or exam and there is no prior echo, we will watch her closely however as we give fluids and we will see what CT scan shows.  We have also given her pain medication and antiemetics.  ----------------------------------------- 7:19 PM on 06/13/2018 -----------------------------------------  Patient feeling much better abdomen remains nonsurgical in terms of the exam, we are giving her fluids and we will see what her CT scan shows.  ----------------------------------------- 8:11 PM on 06/13/2018 -----------------------------------------  T scan shows no evidence of pneumatosis intestinalis or other signs of blockage of any arteries, or inflammatory changes of the intestines to suggest that they are his current ongoing ischemia and she her self feels much better.  I did however discuss with the radiologist personally because I was concerned about the appearance of the aorta and it is significantly reduced in its caliber likely from atherosclerotic plaque.  There is strong femoral pulses and there is no evidence at this time that the patient has a dissection or AAA according to radiology.  I appreciate the consult from the radiologist.  I then discussed her care with Dr. Gilda CreaseSchnier, of vascular surgery, he feels at this time there is no indication for heparin, he does feel that admitting the patient for fluids and having a consult as an inpatient for this longstanding, likely, aortic issue which he does not see as indicative of the need for acute intervention at this time is a reasonable course of action.  Her blood pressure is elevated although we do not know what her baseline is and sometimes when she has been here is been quite high, she has not been on any blood pressure medications and there is some risk I think of  rapidly reducing her blood pressure given her aortic stenosis, at this time with a pressure in the 160s I will defer and acute intervention and he agrees.  We have given her IV fluid and I have not had to repeat her pain medication she still has a mildly tender abdomen.  Certainly possible patient just has gastroenteritis of a viral variety and we are incidentally noticing her aortic sclerosis the 2 are unrelated however her elevated lactate does give me pause.  She is supposed to be taking blood pressure medication but has not taken in 3 years has a history of tobacco abuse is also present she has aortic stenosis but this is very significant in terms of its appearance on CT and I cannot say for sure does not contributing to her symptoms today and therefore we will admit for further evaluation by vascular surgery and medicine.  At this time,  we are allowing permissive hypertension is that I think is probably not far from her baseline and I am very reluctant with her flow issues to acutely lower pressure any significant.    ____________________________________________   FINAL CLINICAL IMPRESSION(S) / ED DIAGNOSES  Final diagnoses:  None      This chart was dictated using voice recognition software.  Despite best efforts to proofread,  errors can occur which can change meaning.      Jeanmarie PlantMcShane, Mayco Walrond A, MD 06/13/18 [id number] [id number]    Jeanmarie PlantMcShane, Kyon Bentler A, MD 06/13/18 Lanice [id number]    Delia Slatten A, MD 06/13/18 2013    Jeanmarie PlantMcShane, Keziah Avis A, MD 06/13/18 2036    Jeanmarie PlantMcShane, Aser Nylund A, MD 06/13/18 2039

## 2018-06-13 NOTE — ED Notes (Signed)
Pt states she began vomiting with upper abd pain early this AM. States diarrhea as well. Denies fever. States right now she is cold. A&O. Ambulatory, had assistance from wheelchair to bed but was strong on feet. States she feels weak. Here with ex-husband.

## 2018-06-13 NOTE — ED Notes (Signed)
Pt eating meal tray is aware of npo status at MN. Pt alert and awake with out complaints. Family at bedside, and will stay with pt while in ED. Pt aware of admission and that she will be holding in ED till bed becomes available. Will monitor and position for comfort.

## 2018-06-13 NOTE — ED Notes (Signed)
MD at bedside. 

## 2018-06-13 NOTE — ED Notes (Signed)
IVF still infusing, will started next bag when current bag finishes. Pt still working on first bottle of oral contrast.

## 2018-06-14 ENCOUNTER — Other Ambulatory Visit: Payer: Self-pay

## 2018-06-14 LAB — GLUCOSE, CAPILLARY
Glucose-Capillary: 121 mg/dL — ABNORMAL HIGH (ref 70–99)
Glucose-Capillary: 136 mg/dL — ABNORMAL HIGH (ref 70–99)
Glucose-Capillary: 172 mg/dL — ABNORMAL HIGH (ref 70–99)
Glucose-Capillary: 187 mg/dL — ABNORMAL HIGH (ref 70–99)
Glucose-Capillary: 218 mg/dL — ABNORMAL HIGH (ref 70–99)

## 2018-06-14 LAB — BASIC METABOLIC PANEL
Anion gap: 10 (ref 5–15)
BUN: 10 mg/dL (ref 8–23)
CO2: 23 mmol/L (ref 22–32)
Calcium: 8.6 mg/dL — ABNORMAL LOW (ref 8.9–10.3)
Chloride: 108 mmol/L (ref 98–111)
Creatinine, Ser: 0.81 mg/dL (ref 0.44–1.00)
GFR calc Af Amer: >60 mL/min (ref >60)
GFR calc non Af Amer: >60 mL/min (ref >60)
Glucose, Bld: 211 mg/dL — ABNORMAL HIGH (ref 70–99)
Potassium: 3.3 mmol/L — ABNORMAL LOW (ref 3.5–5.1)
Sodium: 141 mmol/L (ref 135–145)

## 2018-06-14 LAB — CBC
HCT: 31.2 % — ABNORMAL LOW (ref 36.0–46.0)
Hemoglobin: 10.8 g/dL — ABNORMAL LOW (ref 12.0–15.0)
MCH: 28.8 pg (ref 26.0–34.0)
MCHC: 34.6 g/dL (ref 30.0–36.0)
MCV: 83.2 fL (ref 80.0–100.0)
Platelets: 267 10*3/uL (ref 150–400)
RBC: 3.75 MIL/uL — ABNORMAL LOW (ref 3.87–5.11)
RDW: 13.3 % (ref 11.5–15.5)
WBC: 10.6 10*3/uL — ABNORMAL HIGH (ref 4.0–10.5)
nRBC: 0 % (ref 0.0–0.2)

## 2018-06-14 MED ORDER — POTASSIUM CHLORIDE CRYS ER 20 MEQ PO TBCR
40.0000 meq | EXTENDED_RELEASE_TABLET | Freq: Once | ORAL | Status: AC
  Administered 2018-06-14: 40 meq via ORAL
  Filled 2018-06-14: qty 2

## 2018-06-14 MED ORDER — PNEUMOCOCCAL VAC POLYVALENT 25 MCG/0.5ML IJ INJ
0.5000 mL | INJECTION | INTRAMUSCULAR | Status: AC
  Administered 2018-06-15: 0.5 mL via INTRAMUSCULAR
  Filled 2018-06-14: qty 0.5

## 2018-06-14 MED ORDER — SODIUM CHLORIDE 0.9 % IV SOLN
INTRAVENOUS | Status: DC
  Administered 2018-06-14: 15:00:00 via INTRAVENOUS

## 2018-06-14 MED ORDER — PANTOPRAZOLE SODIUM 40 MG PO TBEC
40.0000 mg | DELAYED_RELEASE_TABLET | Freq: Every day | ORAL | Status: DC
  Administered 2018-06-14 – 2018-06-16 (×3): 40 mg via ORAL
  Filled 2018-06-14 (×3): qty 1

## 2018-06-14 MED ORDER — SODIUM CHLORIDE 0.9 % IV SOLN
INTRAVENOUS | Status: DC
  Administered 2018-06-15: 02:00:00 via INTRAVENOUS

## 2018-06-14 NOTE — ED Notes (Signed)
Pt resting comfortably with eyes closed, no evidence of distress at this time.

## 2018-06-14 NOTE — H&P (View-Only) (Signed)
[facility] Inpatient Gastroenterology Progress Note  Patient has discussed the utility of  endoluminal evaluation with her sister and has decided to PROCEED with EGD. The patient understands the nature of the planned procedure, indications, risks, alternatives and potential complications including but not limited to bleeding, infection, perforation, damage to internal organs and possible oversedation/side effects from anesthesia. The patient agrees and gives consent to proceed.  Please refer to procedure notes for findings, recommendations and patient disposition/instructions.  Plan will be for EGD for tomorrow.  Call me in the interim for any questions.  T. Keith Cookie Pore, M.D. Gastroenterology, Board Certified [facility]  A [facility] [phone] 

## 2018-06-14 NOTE — ED Notes (Signed)
Pt resting comfortably with eyes closed, no change in condition.

## 2018-06-14 NOTE — ED Provider Notes (Signed)
Patient admitted but remains in the emergency room.  Vital signs are stable but blood pressures trended somewhat lower.  We will continue to monitor.  Patient should go upstairs sometime soon.   Arnaldo Natal, MD 06/14/18 [phone]

## 2018-06-14 NOTE — Progress Notes (Signed)
[facility] Inpatient Gastroenterology Progress Note  Patient has discussed the utility of  endoluminal evaluation with her sister and has decided to PROCEED with EGD. The patient understands the nature of the planned procedure, indications, risks, alternatives and potential complications including but not limited to bleeding, infection, perforation, damage to internal organs and possible oversedation/side effects from anesthesia. The patient agrees and gives consent to proceed.  Please refer to procedure notes for findings, recommendations and patient disposition/instructions.  Plan will be for EGD for tomorrow.  Call me in the interim for any questions.  George Ina, M.D. Gastroenterology, Board Certified [facility]  A [facility] [phone]

## 2018-06-14 NOTE — ED Notes (Signed)
ED TO INPATIENT HANDOFF REPORT  ED Nurse Name and Phone #: Navah Grondin [phone]  S Name/Age/Gender [name] 63 y.o. female Room/Bed: ED01A/ED01A  Code Status   Code Status: Full Code  Home/SNF/Other Home Patient oriented x4 Is this baseline? yes  Triage Complete: Triage complete  Chief Complaint nausea;weakness;abd pain  Triage Note Pt reports N/V x 1 week. Pt states she has been unable to keep fluids down or eat. Pt c/o weakness at this time. Pt also c/o new onset diarrhea. Pt c/o lower abdominal pain at this time.    Allergies Allergies  Allergen Reactions  . Penicillins Hives    Has patient had a PCN reaction causing immediate rash, facial/tongue/throat swelling, SOB or lightheadedness with hypotension: Yes Has patient had a PCN reaction causing severe rash involving mucus membranes or skin necrosis: No Has patient had a PCN reaction that required hospitalization: No Has patient had a PCN reaction occurring within the last 10 years: No If all of the above answers are "NO", then may proceed with Cephalosporin use.    Level of Care/Admitting Diagnosis ED Disposition    ED Disposition Condition Comment   Admit  Hospital Area: [facility] [[id number]]  Level of Care: Med-Surg [16]  Diagnosis: Abdominal pain [342876]  Admitting Physician: Oralia Manis [[id number]]  Attending Physician: Oralia Manis [phone]  Estimated length of stay: past midnight tomorrow  Certification:: I certify this patient will need inpatient services for at least 2 midnights  PT Class (Do Not Modify): Inpatient [101]  PT Acc Code (Do Not Modify): Private [1]       B Medical/Surgery History Past Medical History:  Diagnosis Date  . Diabetes mellitus without complication (HCC)   . Hypertension    Past Surgical History:  Procedure Laterality Date  . ABDOMINAL HYSTERECTOMY       A IV Location/Drains/Wounds Patient Lines/Drains/Airways Status   Active  Line/Drains/Airways    Name:   Placement date:   Placement time:   Site:   Days:   Peripheral IV 06/13/18 Right Antecubital   06/13/18    1824    Antecubital   1          Intake/Output Last 24 hours  Intake/Output Summary (Last 24 hours) at 06/14/2018 1619 Last data filed at 06/14/2018 1602 Gross per 24 hour  Intake 2530.34 ml  Output --  Net 2530.34 ml    Labs/Imaging Results for orders placed or performed during the hospital encounter of 06/13/18 (from the past 48 hour(s))  Basic metabolic panel     Status: Abnormal   Collection Time: 06/13/18  1:13 PM  Result Value Ref Range   Sodium 139 135 - 145 mmol/L   Potassium 3.8 3.5 - 5.1 mmol/L   Chloride 107 98 - 111 mmol/L   CO2 22 22 - 32 mmol/L   Glucose, Bld 288 (H) 70 - 99 mg/dL   BUN 11 8 - 23 mg/dL   Creatinine, Ser 4.16 0.44 - 1.00 mg/dL   Calcium 38.4 8.9 - 53.6 mg/dL   GFR calc non Af Amer >60 >60 mL/min   GFR calc Af Amer >60 >60 mL/min   Anion gap 10 5 - 15    Comment: Performed at [facility], [address] [place], [place] [address]  CBC     Status: Abnormal   Collection Time: 06/13/18  1:13 PM  Result Value Ref Range   WBC 10.5 4.0 - 10.5 K/uL   RBC 4.24 3.87 - 5.11 MIL/uL  Hemoglobin 12.3 12.0 - 15.0 g/dL   HCT 16.1 (L) 09.6 - 04.5 %   MCV 82.1 80.0 - 100.0 fL   MCH 29.0 26.0 - 34.0 pg   MCHC 35.3 30.0 - 36.0 g/dL   RDW 40.9 81.1 - 91.4 %   Platelets 328 150 - 400 K/uL   nRBC 0.0 0.0 - 0.2 %    Comment: Performed at [facility], [address] [place], [place] [address]  Glucose, capillary     Status: Abnormal   Collection Time: 06/13/18  1:13 PM  Result Value Ref Range   Glucose-Capillary 288 (H) 70 - 99 mg/dL  Hepatic function panel     Status: None   Collection Time: 06/13/18  1:13 PM  Result Value Ref Range   Total Protein 8.1 6.5 - 8.1 g/dL   Albumin 4.4 3.5 - 5.0 g/dL   AST 15 15 - 41 U/L   ALT 13 0 - 44 U/L   Alkaline Phosphatase 94 38 - 126 U/L   Total Bilirubin  1.0 0.3 - 1.2 mg/dL   Bilirubin, Direct 0.1 0.0 - 0.2 mg/dL   Indirect Bilirubin 0.9 0.3 - 0.9 mg/dL    Comment: Performed at [facility], [address] [place], [place] [address]  Lipase, blood     Status: None   Collection Time: 06/13/18  1:13 PM  Result Value Ref Range   Lipase 25 11 - 51 U/L    Comment: Performed at [facility], [address] [place], [place] [address]  Troponin I - Add-On to previous collection     Status: None   Collection Time: 06/13/18  1:13 PM  Result Value Ref Range   Troponin I <0.03 <0.03 ng/mL    Comment: Performed at [facility], [address] [place], [place] [address]  Lactic acid, plasma     Status: Abnormal   Collection Time: 06/13/18  6:23 PM  Result Value Ref Range   Lactic Acid, Venous 4.2 (HH) 0.5 - 1.9 mmol/L    Comment: CRITICAL RESULT CALLED TO, READ BACK BY AND VERIFIED WITH KATE BUMGARNER 06/13/18 @ 1849  MLK Performed at [facility], [address] [place], [place] [address]   Lactic acid, plasma     Status: None   Collection Time: 06/13/18  9:34 PM  Result Value Ref Range   Lactic Acid, Venous 1.6 0.5 - 1.9 mmol/L    Comment: Performed at [facility], [address] [place], [place] [address]  Urinalysis, Complete w Microscopic     Status: Abnormal   Collection Time: 06/13/18 10:59 PM  Result Value Ref Range   Color, Urine STRAW (A) YELLOW   APPearance CLEAR (A) CLEAR   Specific Gravity, Urine 1.026 1.005 - 1.030   pH 6.0 5.0 - 8.0   Glucose, UA >=500 (A) NEGATIVE mg/dL   Hgb urine dipstick SMALL (A) NEGATIVE   Bilirubin Urine NEGATIVE NEGATIVE   Ketones, ur NEGATIVE NEGATIVE mg/dL   Protein, ur 010 (A) NEGATIVE mg/dL   Nitrite NEGATIVE NEGATIVE   Leukocytes,Ua NEGATIVE NEGATIVE   RBC / HPF 0-5 0 - 5 RBC/hpf   WBC, UA 0-5 0 - 5 WBC/hpf   Bacteria, UA NONE SEEN NONE SEEN   Squamous Epithelial / LPF 0-5 0 - 5    Comment: Performed at [facility], [address] [place], [place] [address]  Glucose, capillary     Status: Abnormal   Collection Time: 06/14/18 12:04  AM  Result Value Ref Range   Glucose-Capillary 172 (H) 70 - 99 mg/dL  Basic metabolic panel     Status: Abnormal   Collection Time: 06/14/18  5:08 AM  Result Value Ref Range   Sodium 141 135 - 145 mmol/L   Potassium 3.3 (L) 3.5 - 5.1 mmol/L   Chloride 108 98 - 111 mmol/L   CO2 23 22 - 32 mmol/L   Glucose, Bld 211 (H) 70 - 99 mg/dL   BUN 10 8 - 23 mg/dL   Creatinine, Ser 9.62 0.44 - 1.00 mg/dL   Calcium 8.6 (L) 8.9 - 10.3 mg/dL   GFR calc non Af Amer >60 >60 mL/min   GFR calc Af Amer >60 >60 mL/min   Anion gap 10 5 - 15    Comment: Performed at [facility], [address] [place], [place] [address]  CBC     Status: Abnormal   Collection Time: 06/14/18  5:08 AM  Result Value Ref Range   WBC 10.6 (H) 4.0 - 10.5 K/uL   RBC 3.75 (L) 3.87 - 5.11 MIL/uL   Hemoglobin 10.8 (L) 12.0 - 15.0 g/dL   HCT 89.2 (L) 11.9 - 41.7 %   MCV 83.2 80.0 - 100.0 fL   MCH 28.8 26.0 - 34.0 pg   MCHC 34.6 30.0 - 36.0 g/dL   RDW 40.8 14.4 - 81.8 %   Platelets 267 150 - 400 K/uL   nRBC 0.0 0.0 - 0.2 %    Comment: Performed at [facility], [address] [place], [place] [address]  Glucose, capillary     Status: Abnormal   Collection Time: 06/14/18  7:58 AM  Result Value Ref Range   Glucose-Capillary 187 (H) 70 - 99 mg/dL  Glucose, capillary     Status: Abnormal   Collection Time: 06/14/18 11:37 AM  Result Value Ref Range   Glucose-Capillary 136 (H) 70 - 99 mg/dL   Ct Abdomen Pelvis W Contrast  Result Date: 06/13/2018 CLINICAL DATA:  Acute onset of generalized abdominal pain, nausea and vomiting that began earlier today. EXAM: CT ABDOMEN AND PELVIS WITH CONTRAST TECHNIQUE: Multidetector CT imaging of the abdomen and pelvis was performed using the standard protocol following bolus administration of intravenous contrast. CONTRAST:  OMNIPAQUE IOHEXOL 300 MG/ML IV.  Oral contrast was also administered. COMPARISON:  Unenhanced CT abdomen and pelvis 08/30/2006. FINDINGS: Lower chest: Scarring in the RIGHT MIDDLE LOBE. Emphysematous changes in visualized lung bases. LEFT LOWER LOBE bronchiectasis. Heart size upper normal. Hepatobiliary: Mild diffuse hepatic steatosis without focal hepatic parenchymal abnormality. Surgically absent gallbladder. No unexpected biliary ductal dilation. Pancreas: Mildly atrophic without evidence of mass or peripancreatic inflammation. Spleen: Normal in size and appearance. Adrenals/Urinary Tract: Normal appearing adrenal glands. No urinary tract calculi. No hydronephrosis. Benign 8 mm cyst involving the UPPER pole of the RIGHT kidney posterolaterally with a Hounsfield measurement of 20. No focal parenchymal abnormality elsewhere in either kidney. Normal appearing mildly distended urinary bladder. Stomach/Bowel: Stomach normal in appearance for the degree of distention. Normal-appearing small bowel. Ascending colon, transverse colon and descending colon decompressed. Expected stool burden in the sigmoid colon and rectum. Scattered diverticula throughout the colon without evidence of acute diverticulitis. Normal appendix in the RIGHT mid and UPPER pelvis. Vascular/Lymphatic: Severe atherosclerosis involving the abdominal aorta and the iliofemoral arteries with calcified and noncalcified plaque. Stenosis involving the infrarenal abdominal aorta. Atherosclerosis at the origins of the celiac and SMA without visible stenosis. Atherosclerosis at the origins of the  renal arteries. Possible hemodynamically significant stenosis involving the LEFT common femoral artery. No pathologic lymphadenopathy. Reproductive: Surgically absent uterus. No adnexal masses. Other: None. Musculoskeletal: Facet degenerative changes involving the LOWER lumbar spine. Mild degenerative changes in both hips. No acute findings. IMPRESSION: 1. No acute abnormalities involving the  abdomen or pelvis. 2. Mild diffuse hepatic steatosis without focal hepatic parenchymal abnormality. 3. Severe generalized atherosclerosis with calcified and noncalcified plaque. 4. Possible significant stenosis involving the mid aorta. Possible significant stenosis involving the LEFT common femoral artery. Mild stenosis at the origin of the SMA which does not appear critical. 5.  Aortic Atherosclerosis, severe.  (ICD10-170.0) 6. Scattered colonic diverticula without evidence of acute diverticulitis. Results were discussed directly by telephone with Dr. Alphonzo LemmingsMcShane of the emergency department at the time of interpretation on 06/13/2018 at 7:55 p.m. Electronically Signed   By: Hulan Saashomas  Lawrence M.D.   On: 06/13/2018 19:57    Pending Labs Unresulted Labs (From admission, onward)    Start     Ordered   06/20/18 0500  Creatinine, serum  (enoxaparin (LOVENOX)    CrCl >/= 30 ml/min)  Weekly,   STAT    Comments:  while on enoxaparin therapy    06/13/18 2354   06/15/18 0500  Basic metabolic panel  Tomorrow morning,   STAT     06/14/18 1430   06/15/18 0500  CBC  Tomorrow morning,   STAT     06/14/18 1430   06/15/18 0500  Magnesium  Tomorrow morning,   STAT     06/14/18 1430   06/13/18 2355  HIV antibody (Routine Testing)  Once,   STAT     06/13/18 2354          Vitals/Pain Today's Vitals   06/14/18 1100 06/14/18 1200 06/14/18 1300 06/14/18 1500  BP: 133/76 (!) 143/77 132/65 (!) 150/66  Pulse: 69 62 65 76  Resp: 14 15 14 17   Temp:      TempSrc:      SpO2: 96% 98% 98% 100%  Weight:      Height:      PainSc:        Isolation Precautions No active isolations  Medications Medications  morphine 4 MG/ML injection 4 mg (4 mg Intravenous Given 06/14/18 1042)  promethazine (PHENERGAN) injection 12.5 mg (has no administration in time range)  famotidine (PEPCID) tablet 20 mg (20 mg Oral Given 06/14/18 0926)  albuterol (PROVENTIL) (2.5 MG/3ML) 0.083% nebulizer solution 2.5 mg (has no administration in  time range)  enoxaparin (LOVENOX) injection 40 mg (40 mg Subcutaneous Not Given 06/14/18 0210)  0.9 %  sodium chloride infusion ( Intravenous Stopped 06/14/18 1350)  acetaminophen (TYLENOL) tablet 650 mg (has no administration in time range)    Or  acetaminophen (TYLENOL) suppository 650 mg (has no administration in time range)  ondansetron (ZOFRAN) tablet 4 mg ( Oral See Alternative 06/14/18 1042)    Or  ondansetron (ZOFRAN) injection 4 mg (4 mg Intravenous Given 06/14/18 1042)  insulin aspart (novoLOG) injection 0-9 Units (1 Units Subcutaneous Given 06/14/18 1153)  insulin aspart (novoLOG) injection 0-5 Units (0 Units Subcutaneous Not Given 06/14/18 0017)  pantoprazole (PROTONIX) EC tablet 40 mg (40 mg Oral Given 06/14/18 1509)  0.9 %  sodium chloride infusion ( Intravenous Rate/Dose Verify 06/14/18 1602)  pneumococcal 23 valent vaccine (PNU-IMMUNE) injection 0.5 mL (has no administration in time range)  sodium chloride 0.9 % bolus 500 mL (0 mLs Intravenous Stopped 06/13/18 1952)  morphine 4 MG/ML injection 4 mg (4  mg Intravenous Given 06/13/18 1825)  iopamidol (ISOVUE-300) 61 % injection 30 mL (30 mLs Oral Contrast Given 06/13/18 1806)  promethazine (PHENERGAN) injection 6.25 mg (6.25 mg Intravenous Given 06/13/18 1825)  sodium chloride 0.9 % bolus 1,000 mL (0 mLs Intravenous Stopped 06/13/18 2227)  iohexol (OMNIPAQUE) 300 MG/ML solution 100 mL (100 mLs Intravenous Contrast Given 06/13/18 1926)  morphine 4 MG/ML injection 4 mg (4 mg Intravenous Given 06/13/18 2101)  potassium chloride SA (K-DUR,KLOR-CON) CR tablet 40 mEq (40 mEq Oral Given 06/14/18 1346)    Mobility Moderate fall risk  ambulatory

## 2018-06-14 NOTE — ED Notes (Signed)
Pt given clear liquid diet tray.

## 2018-06-14 NOTE — Progress Notes (Signed)
[employer] - [place] [facility]   PATIENT NAME: Chalsey Garlow    MR#:  [id number]  DATE OF BIRTH:  July 25, 1955  SUBJECTIVE:  CHIEF COMPLAINT:   Chief Complaint  Patient presents with  . Weakness  . Abdominal Pain   No new complaints at the time of my evaluation.  No fevers.  Nausea and vomiting and abdominal pains improved.  REVIEW OF SYSTEMS:  Review of Systems  Constitutional: Negative for chills and fever.  HENT: Negative for hearing loss and tinnitus.   Eyes: Negative for blurred vision and double vision.  Respiratory: Negative for cough and hemoptysis.   Cardiovascular: Negative for chest pain and palpitations.  Gastrointestinal:       Nausea with vomiting and abdominal pains improved  Genitourinary: Negative for dysuria and urgency.  Musculoskeletal: Negative for myalgias and neck pain.  Skin: Negative for itching and rash.  Neurological: Negative for dizziness and headaches.  Psychiatric/Behavioral: Negative for depression and substance abuse.      DRUG ALLERGIES:   Allergies  Allergen Reactions  . Penicillins Hives    Has patient had a PCN reaction causing immediate rash, facial/tongue/throat swelling, SOB or lightheadedness with hypotension: Yes Has patient had a PCN reaction causing severe rash involving mucus membranes or skin necrosis: No Has patient had a PCN reaction that required hospitalization: No Has patient had a PCN reaction occurring within the last 10 years: No If all of the above answers are "NO", then may proceed with Cephalosporin use.   VITALS:  Blood pressure (!) 143/77, pulse 62, temperature 98.4 F (36.9 C), temperature source Oral, resp. rate 15, height 5\' 6"  (1.676 m), weight 64 kg, SpO2 98 %. PHYSICAL EXAMINATION:   Physical Exam  Constitutional: She is oriented to person, place, and time. She appears well-developed and well-nourished.  HENT:  Head: Normocephalic and atraumatic.  Mouth/Throat: Oropharynx  is clear and moist.  Eyes: Pupils are equal, round, and reactive to light. Conjunctivae and EOM are normal.  Neck: Normal range of motion. Neck supple. No tracheal deviation present.  Cardiovascular: Normal rate and regular rhythm.  Respiratory: Effort normal and breath sounds normal. She has no wheezes.  GI: Soft. Bowel sounds are normal.  Musculoskeletal: Normal range of motion.        General: No edema.  Neurological: She is alert and oriented to person, place, and time. She has normal reflexes.  Skin: Skin is warm. She is not diaphoretic. No erythema.  Psychiatric: She has a normal mood and affect. Thought content normal.   LABORATORY PANEL:  Female CBC Recent Labs  Lab 06/14/18 0508  WBC 10.6*  HGB 10.8*  HCT 31.2*  PLT 267   ------------------------------------------------------------------------------------------------------------------ Chemistries  Recent Labs  Lab 06/13/18 1313 06/14/18 0508  NA 139 141  K 3.8 3.3*  CL 107 108  CO2 22 23  GLUCOSE 288* 211*  BUN 11 10  CREATININE 0.80 0.81  CALCIUM 10.0 8.6*  AST 15  --   ALT 13  --   ALKPHOS 94  --   BILITOT 1.0  --    RADIOLOGY:  Ct Abdomen Pelvis W Contrast  Result Date: 06/13/2018 CLINICAL DATA:  Acute onset of generalized abdominal pain, nausea and vomiting that began earlier today. EXAM: CT ABDOMEN AND PELVIS WITH CONTRAST TECHNIQUE: Multidetector CT imaging of the abdomen and pelvis was performed using the standard protocol following bolus administration of intravenous contrast. CONTRAST:  OMNIPAQUE IOHEXOL 300 MG/ML IV. Oral contrast was also administered.  COMPARISON:  Unenhanced CT abdomen and pelvis 08/30/2006. FINDINGS: Lower chest: Scarring in the RIGHT MIDDLE LOBE. Emphysematous changes in visualized lung bases. LEFT LOWER LOBE bronchiectasis. Heart size upper normal. Hepatobiliary: Mild diffuse hepatic steatosis without focal hepatic parenchymal abnormality. Surgically absent gallbladder. No  unexpected biliary ductal dilation. Pancreas: Mildly atrophic without evidence of mass or peripancreatic inflammation. Spleen: Normal in size and appearance. Adrenals/Urinary Tract: Normal appearing adrenal glands. No urinary tract calculi. No hydronephrosis. Benign 8 mm cyst involving the UPPER pole of the RIGHT kidney posterolaterally with a Hounsfield measurement of 20. No focal parenchymal abnormality elsewhere in either kidney. Normal appearing mildly distended urinary bladder. Stomach/Bowel: Stomach normal in appearance for the degree of distention. Normal-appearing small bowel. Ascending colon, transverse colon and descending colon decompressed. Expected stool burden in the sigmoid colon and rectum. Scattered diverticula throughout the colon without evidence of acute diverticulitis. Normal appendix in the RIGHT mid and UPPER pelvis. Vascular/Lymphatic: Severe atherosclerosis involving the abdominal aorta and the iliofemoral arteries with calcified and noncalcified plaque. Stenosis involving the infrarenal abdominal aorta. Atherosclerosis at the origins of the celiac and SMA without visible stenosis. Atherosclerosis at the origins of the renal arteries. Possible hemodynamically significant stenosis involving the LEFT common femoral artery. No pathologic lymphadenopathy. Reproductive: Surgically absent uterus. No adnexal masses. Other: None. Musculoskeletal: Facet degenerative changes involving the LOWER lumbar spine. Mild degenerative changes in both hips. No acute findings. IMPRESSION: 1. No acute abnormalities involving the abdomen or pelvis. 2. Mild diffuse hepatic steatosis without focal hepatic parenchymal abnormality. 3. Severe generalized atherosclerosis with calcified and noncalcified plaque. 4. Possible significant stenosis involving the mid aorta. Possible significant stenosis involving the LEFT common femoral artery. Mild stenosis at the origin of the SMA which does not appear critical. 5.  Aortic  Atherosclerosis, severe.  (ICD10-170.0) 6. Scattered colonic diverticula without evidence of acute diverticulitis. Results were discussed directly by telephone with Dr. Alphonzo LemmingsMcShane of the emergency department at the time of interpretation on 06/13/2018 at 7:55 p.m. Electronically Signed   By: Hulan Saashomas  Lawrence M.D.   On: 06/13/2018 19:57   ASSESSMENT AND PLAN:   1.  Probable acute gastritis  Patient presented with complaints of epigastric pains , nausea and vomiting.   Symptoms already significantly improved today.   Continue IV fluid hydration.  Placed on Protonix.  Patient started on clear liquid diet.  Advance as tolerated Gastroenterology service already consulted.  Follow-up on evaluation to determine if upper endoscopy is indicated.  2.  Peripheral artery disease Patient does have significant mid aortic and femoral arterial disease.  Patient does have evidence of possible significant stenosis involving the mid aorta and possible significant stenosis involving the left common femoral artery.  Vascular surgery already consulted.  Follow-up on input.  Elevated lactic acid level on presentation likely due to dehydration.  Resolved with IV fluids.    3.  Hypertension Blood pressure fairly controlled.  Monitor  4.  Diabetes mellitus type 2 Continue insulin sliding scale coverage Monitor and adjust regimen  DVT prophylaxis; Lovenox   All the records are reviewed and case discussed with Care Management/Social Worker. Management plans discussed with the patient, family and they are in agreement.  CODE STATUS: Full Code  TOTAL TIME TAKING CARE OF THIS PATIENT: 24 minutes.   More than 50% of the time was spent in counseling/coordination of care: YES  POSSIBLE D/C IN 1-2 DAYS, DEPENDING ON CLINICAL CONDITION.   Roneshia Drew M.D on 06/14/2018 at 2:31 PM  Between 7am to 6pm - Pager -  [phone]  After 6pm go to [email or web address] - Scientist, research (life sciences) [facility]    Office  [phone]  CC: Primary care physician; Center, [facility]  Note: This dictation was prepared with Nurse, children's dictation along with smaller phrase technology. Any transcriptional errors that result from this process are unintentional.

## 2018-06-14 NOTE — ED Notes (Signed)
Pt up to toilet at this time. When ambulating back to bed pt started moaning like she was in pain. Pain 9/10 at this time.

## 2018-06-14 NOTE — ED Notes (Signed)
Dietary contacted to get a clear liquid diet tray for pt at this time

## 2018-06-14 NOTE — Consult Note (Signed)
GI Inpatient Consult Note  Reason for Consult: Epigastric pain, nausea/vomtiing    Attending Requesting Consult: Dr. Kipp Brood  History of Present Illness: Sylvia [name] is a 63 y.o. female seen for evaluation of epigastric abd pain and nausea/vomiting at the request of Dr. Kipp Brood. Pt presented to the ED last night for complaints of epigastric abd pain over the past 3 days that she describes as sharp in nature. Pain gradually worsened so she presented to the ED for further evaluation. No radiation of her pain. No clear aggravating or alleviating factors. She reports associated factors of nausea and non-bloody and non-bilious emesis. Last episode of emesis was this morning. She reports she was told >10 years ago she might have an ulcer. She thinks this pain feels similar to what she was feeling then. She takes OTC antacid pill daily and this works well to prevent GERD exacerbations. No prior endoscopy. She reports she was taking Aleve one tablet daily over the past few weeks for chronic headaches and pain. She denies change in bowel habits. She denies hematochezia or melena. No dysphagia or odynophagia. She is s/p cholecystectomy. She had CTA imaging performed which showed significant mid aortic and femoral stenosis due to calcification and plaque. Vascular was consulted and said no immediate intervention needed and she will f/u as outpatient. She reports daily tobacco use. She was able to tolerating some beef broth and frozen lemonade today without any recurrent nausea or vomiting. No fevers or sick contacts. She does report a lot of external stressors in her life over the past month. Her daughter recently passed away and this has been very stressful.    Last Colonoscopy: N/A Last Endoscopy: N/A   Past Medical History:  Past Medical History:  Diagnosis Date  . Diabetes mellitus without complication (HCC)   . Hypertension     Problem List: Patient Active Problem List   Diagnosis Date Noted  .  Abdominal pain 06/13/2018  . HTN (hypertension) 06/13/2018  . Diabetes (HCC) 06/13/2018  . Elevated lactic acid level 06/13/2018    Past Surgical History: Past Surgical History:  Procedure Laterality Date  . ABDOMINAL HYSTERECTOMY      Allergies: Allergies  Allergen Reactions  . Penicillins Hives    Has patient had a PCN reaction causing immediate rash, facial/tongue/throat swelling, SOB or lightheadedness with hypotension: Yes Has patient had a PCN reaction causing severe rash involving mucus membranes or skin necrosis: No Has patient had a PCN reaction that required hospitalization: No Has patient had a PCN reaction occurring within the last 10 years: No If all of the above answers are "NO", then may proceed with Cephalosporin use.    Home Medications: (Not in a hospital admission)  Home medication reconciliation was completed with the patient.   Scheduled Inpatient Medications:   . enoxaparin (LOVENOX) injection  40 mg Subcutaneous Q24H  . famotidine  20 mg Oral Daily  . insulin aspart  0-5 Units Subcutaneous QHS  . insulin aspart  0-9 Units Subcutaneous TID WC  . pantoprazole  40 mg Oral Daily    Continuous Inpatient Infusions:   . sodium chloride 100 mL/hr at 06/14/18 1514    PRN Inpatient Medications:  acetaminophen **OR** acetaminophen, albuterol, morphine injection, ondansetron **OR** ondansetron (ZOFRAN) IV, promethazine  Family History: family history includes Breast cancer (age of onset: 65) in her mother.  The patient's family history is negative for inflammatory bowel disorders, GI malignancy, or solid organ transplantation.  Social History:   reports that she has been  smoking. She has never used smokeless tobacco. She reports that she does not drink alcohol. The patient denies ETOH, tobacco, or drug use.   Review of Systems: Constitutional: Weight is stable.  Eyes: No changes in vision. ENT: No oral lesions, sore throat.  GI: see HPI.  Heme/Lymph: No  easy bruising.  CV: No chest pain.  GU: No hematuria.  Integumentary: No rashes.  Neuro: No headaches.  Psych: No depression/anxiety.  Endocrine: No heat/cold intolerance.  Allergic/Immunologic: No urticaria.  Resp: No cough, SOB.  Musculoskeletal: No joint swelling.    Physical Examination: BP (!) 150/66   Pulse 76   Temp 98.4 F (36.9 C) (Oral)   Resp 17   Ht 5\' 6"  (1.676 m)   Wt 64 kg   SpO2 100%   BMI 22.76 kg/m  Gen: NAD, alert and oriented x 4 HEENT: PEERLA, EOMI, Neck: supple, no JVD or thyromegaly Chest: CTA bilaterally, no wheezes, crackles, or other adventitious sounds CV: RRR, no m/g/c/r Abd: soft, mild epigastric tenderness, ND, +BS in all four quadrants; no HSM, guarding, ridigity, or rebound tenderness Ext: no edema, well perfused with 2+ pulses, Skin: no rash or lesions noted Lymph: no LAD  Data: Lab Results  Component Value Date   WBC 10.6 (H) 06/14/2018   HGB 10.8 (L) 06/14/2018   HCT 31.2 (L) 06/14/2018   MCV 83.2 06/14/2018   PLT 267 06/14/2018   Recent Labs  Lab 06/13/18 1313 06/14/18 0508  HGB 12.3 10.8*   Lab Results  Component Value Date   NA 141 06/14/2018   K 3.3 (L) 06/14/2018   CL 108 06/14/2018   CO2 23 06/14/2018   BUN 10 06/14/2018   CREATININE 0.81 06/14/2018   Lab Results  Component Value Date   ALT 13 06/13/2018   AST 15 06/13/2018   ALKPHOS 94 06/13/2018   BILITOT 1.0 06/13/2018   No results for input(s): APTT, INR, PTT in the last 168 hours. Assessment/Plan:  63 y/o AA female with a PMH of diabetes mellitus, atherosclerotic disease, tobacco abuse, HLD, and HTN admitted for epigastric pain, nausea, and vomiting  1. Epigastric abd pain 2. Nausea and vomiting: 3. Atherosclerotic disease of aorto-iliac/femoral system - seen by Vascular, they will see as outpatient. No urgent intervention needed  - Symptoms are consistent with possible peptic ulcer disease +/- H pylori versus gastritis, likely related to overuse of  NSAIDs. DDx also includes esophagitis, duodenitis, functional dyspepsia, gastroenteritis, IBD, functional - Recommend pt start daily full dose PPI to treat any underlying inflammation, irritation, or ulcer in esophagus, stomach, small intestine - We discussed avoiding NSAIDs - Offered endoscopy while patient is admitted, but she would like to talk with family first. We discussed procedure details, indications, and potential risks and complications, including bleeding, infection, small puncture to internal organs, or problems with anesthesia.  - Consider EGD when clinically feasible. Can perform tomorrow around lunch if patient consents    Thank you for the consult. Please call with questions or concerns.  Gilda Crease, PA-C [facility]

## 2018-06-14 NOTE — ED Notes (Signed)
Pt co increasing abd pain rates it 10/10, meds given per MD order.

## 2018-06-14 NOTE — ED Notes (Signed)
Pt assisted to toilet at this time 

## 2018-06-14 NOTE — Consult Note (Signed)
[facility] Vascular Consult Note  MRN : [id number]  Sylvia [name] is a 63 y.o. (08/08/1955) female who presents with chief complaint of  Chief Complaint  Patient presents with  . Weakness  . Abdominal Pain   History of Present Illness:  The patient is a 63 year old female with a past medical history of hypertension, diabetes and active tobacco use who presented to the [facility] department yesterday complaining of progressively worsening abdominal pain.  Patient endorses a history of lower abdominal pain which has progressively worsened over the last week.  Over the last 2 to 3 days, the patient began to experience nausea and nonbilious vomiting.  The patient states she started to feel progressively weaker.  Patient notes multiple soft non-bloody stools.  She denies any fever.  During the patient's work-up in the emergency department she underwent a CT of the abdomen pelvis which was notable for: Severe generalized atherosclerosis with calcified and noncalcified plaque. Possible significant stenosis involving the mid aorta. Possible significant stenosis involving the LEFT common femoral artery. Mild stenosis at the origin of the SMA which does not appear critical. Aortic Atherosclerosis, severe.    The patient does endorse a history of intermittent bilateral calf claudication which worsens with activity.  The patient notes that this symptom has been present for "years".  She denies any rest pain or ulcer formation to the bilateral legs.  Patient denies any edema to the bilateral legs.  Aside from her recent abdominal pain which brought her to the ED she denies any postprandial pain, nausea vomiting.  Vascular surgery was consulted by Dr. Anne HahnWillis for further recommendations. Current Facility-Administered Medications  Medication Dose Route Frequency Provider Last Rate Last Dose  . acetaminophen (TYLENOL) tablet 650 mg  650 mg Oral  Q6H PRN Oralia ManisWillis, David, MD       Or  . acetaminophen (TYLENOL) suppository 650 mg  650 mg Rectal Q6H PRN Oralia ManisWillis, David, MD      . albuterol (PROVENTIL) (2.5 MG/3ML) 0.083% nebulizer solution 2.5 mg  2.5 mg Inhalation Q6H PRN Oralia ManisWillis, David, MD      . enoxaparin (LOVENOX) injection 40 mg  40 mg Subcutaneous Q24H Oralia ManisWillis, David, MD      . famotidine (PEPCID) tablet 20 mg  20 mg Oral Daily Oralia ManisWillis, David, MD   20 mg at 06/14/18 [id number]  . insulin aspart (novoLOG) injection 0-5 Units  0-5 Units Subcutaneous QHS Oralia ManisWillis, David, MD      . insulin aspart (novoLOG) injection 0-9 Units  0-9 Units Subcutaneous TID WC Oralia ManisWillis, David, MD   1 Units at 06/14/18 1153  . morphine 4 MG/ML injection 4 mg  4 mg Intravenous Q4H PRN Oralia ManisWillis, David, MD   4 mg at 06/14/18 1042  . ondansetron (ZOFRAN) tablet 4 mg  4 mg Oral Q6H PRN Oralia ManisWillis, David, MD       Or  . ondansetron [facility](ZOFRAN) injection 4 mg  4 mg Intravenous Q6H PRN Oralia ManisWillis, David, MD   4 mg at 06/14/18 1042  . promethazine (PHENERGAN) injection 12.5 mg  12.5 mg Intravenous Q6H PRN Oralia ManisWillis, David, MD       Current Outpatient Medications  Medication Sig Dispense Refill  . albuterol (PROVENTIL HFA;VENTOLIN HFA) 108 (90 Base) MCG/ACT inhaler Inhale 2 puffs into the lungs every 6 (six) hours as needed for wheezing or shortness of breath. 1 Inhaler 2  . ranitidine (ZANTAC) 150 MG tablet Take 150 mg by mouth daily.     Past Medical  History:  Diagnosis Date  . Diabetes mellitus without complication (HCC)   . Hypertension    Past Surgical History:  Procedure Laterality Date  . ABDOMINAL HYSTERECTOMY     Social History Social History   Tobacco Use  . Smoking status: Current Every Day Smoker  . Smokeless tobacco: Never Used  Substance Use Topics  . Alcohol use: No    Frequency: Never  . Drug use: Not on file   Family History Family History  Problem Relation Age of Onset  . Breast cancer Mother 45  Denies any family history of peripheral artery disease, venous  disease and or renal disease.  Allergies  Allergen Reactions  . Penicillins Hives    Has patient had a PCN reaction causing immediate rash, facial/tongue/throat swelling, SOB or lightheadedness with hypotension: Yes Has patient had a PCN reaction causing severe rash involving mucus membranes or skin necrosis: No Has patient had a PCN reaction that required hospitalization: No Has patient had a PCN reaction occurring within the last 10 years: No If all of the above answers are "NO", then may proceed with Cephalosporin use.   REVIEW OF SYSTEMS (Negative unless checked)  Constitutional: [] Weight loss  [] Fever  [] Chills Cardiac: [] Chest pain   [] Chest pressure   [] Palpitations   [] Shortness of breath when laying flat   [] Shortness of breath at rest   [] Shortness of breath with exertion. Vascular:  [x] Pain in legs with walking   [] Pain in legs at rest   [] Pain in legs when laying flat   [x] Claudication   [x] Pain in feet when walking  [] Pain in feet at rest  [] Pain in feet when laying flat   [] History of DVT   [] Phlebitis   [] Swelling in legs   [] Varicose veins   [] Non-healing ulcers Pulmonary:   [] Uses home oxygen   [] Productive cough   [] Hemoptysis   [] Wheeze  [] COPD   [] Asthma Neurologic:  [] Dizziness  [] Blackouts   [] Seizures   [] History of stroke   [] History of TIA  [] Aphasia   [] Temporary blindness   [] Dysphagia   [] Weakness or numbness in arms   [] Weakness or numbness in legs Musculoskeletal:  [] Arthritis   [] Joint swelling   [] Joint pain   [] Low back pain Hematologic:  [] Easy bruising  [] Easy bleeding   [] Hypercoagulable state   [] Anemic  [] Hepatitis Gastrointestinal:  [] Blood in stool   [] Vomiting blood  [] Gastroesophageal reflux/heartburn   [] Difficulty swallowing. Genitourinary:  [] Chronic kidney disease   [] Difficult urination  [] Frequent urination  [] Burning with urination   [] Blood in urine Skin:  [] Rashes   [] Ulcers   [] Wounds Psychological:  [] History of anxiety   []  History of major  depression.  Physical Examination  Vitals:   06/14/18 0900 06/14/18 1000 06/14/18 1100 06/14/18 1200  BP: 133/70 123/70 133/76 (!) 143/77  Pulse: 77 76 69 62  Resp: 17 15 14 15   Temp:      TempSrc:      SpO2: 96% 97% 96% 98%  Weight:      Height:       Body mass index is 22.76 kg/m. Gen:  WD/WN, NAD Head: /AT, No temporalis wasting. Prominent temp pulse not noted. Ear/Nose/Throat: Hearing grossly intact, nares w/o erythema or drainage, oropharynx w/o Erythema/Exudate Eyes: Sclera non-icteric, conjunctiva clear Neck: Trachea midline.  No JVD.  Pulmonary:  Good air movement, respirations not labored, equal bilaterally.  Cardiac: RRR, normal S1, S2. Vascular:  Vessel Right Left  Radial Palpable Palpable  Ulnar Palpable Palpable  Brachial  Palpable Palpable  Carotid Palpable, without bruit Palpable, without bruit  Aorta Not palpable N/A  Femoral Palpable Palpable  Popliteal Palpable Palpable  PT Non-Palpable Non-Palpable  DP Non-Palpable Non-Palpable   Right Lower Extremity: Thigh soft, calf soft.  Extremity is warm down to toes.  Hard to palpate pedal pulses however the foot is warm.  Motor/sensory is intact.  Left Lower Extremity: Thigh soft, calf soft.  Extremity is warm down to toes.  Hard to palpate pedal pulses however the foot is warm.  Motor/sensory is intact  Gastrointestinal: soft, mild distention, tender to palpation, (+) bowel sounds Musculoskeletal: M/S 5/5 throughout.  Extremities without ischemic changes.  No deformity or atrophy. No edema. Neurologic: Sensation grossly intact in extremities.  Symmetrical.  Speech is fluent. Motor exam as listed above. Psychiatric: Judgment intact, Mood & affect appropriate for pt's clinical situation. Dermatologic: No rashes or ulcers noted.  No cellulitis or open wounds. Lymph : No Cervical, Axillary, or Inguinal lymphadenopathy.  CBC Lab Results  Component Value Date   WBC 10.6 (H) 06/14/2018   HGB 10.8 (L) 06/14/2018    HCT 31.2 (L) 06/14/2018   MCV 83.2 06/14/2018   PLT 267 06/14/2018   BMET    Component Value Date/Time   NA 141 06/14/2018 0508   K 3.3 (L) 06/14/2018 0508   CL 108 06/14/2018 0508   CO2 23 06/14/2018 0508   GLUCOSE 211 (H) 06/14/2018 0508   BUN 10 06/14/2018 0508   CREATININE 0.81 06/14/2018 0508   CALCIUM 8.6 (L) 06/14/2018 0508   GFRNONAA >60 06/14/2018 0508   GFRAA >60 06/14/2018 [id number]   Estimated Creatinine Clearance: 66.5 mL/min (by C-G formula based on SCr of 0.81 mg/dL).  COAG No results found for: INR, PROTIME  Radiology Ct Abdomen Pelvis W Contrast  Result Date: 06/13/2018 CLINICAL DATA:  Acute onset of generalized abdominal pain, nausea and vomiting that began earlier today. EXAM: CT ABDOMEN AND PELVIS WITH CONTRAST TECHNIQUE: Multidetector CT imaging of the abdomen and pelvis was performed using the standard protocol following bolus administration of intravenous contrast. CONTRAST:  100mL OMNIPAQUE IOHEXOL 300 MG/ML IV. Oral contrast was also administered. COMPARISON:  Unenhanced CT abdomen and pelvis 08/30/2006. FINDINGS: Lower chest: Scarring in the RIGHT MIDDLE LOBE. Emphysematous changes in visualized lung bases. LEFT LOWER LOBE bronchiectasis. Heart size upper normal. Hepatobiliary: Mild diffuse hepatic steatosis without focal hepatic parenchymal abnormality. Surgically absent gallbladder. No unexpected biliary ductal dilation. Pancreas: Mildly atrophic without evidence of mass or peripancreatic inflammation. Spleen: Normal in size and appearance. Adrenals/Urinary Tract: Normal appearing adrenal glands. No urinary tract calculi. No hydronephrosis. Benign 8 mm cyst involving the UPPER pole of the RIGHT kidney posterolaterally with a Hounsfield measurement of 20. No focal parenchymal abnormality elsewhere in either kidney. Normal appearing mildly distended urinary bladder. Stomach/Bowel: Stomach normal in appearance for the degree of distention. Normal-appearing small  bowel. Ascending colon, transverse colon and descending colon decompressed. Expected stool burden in the sigmoid colon and rectum. Scattered diverticula throughout the colon without evidence of acute diverticulitis. Normal appendix in the RIGHT mid and UPPER pelvis. Vascular/Lymphatic: Severe atherosclerosis involving the abdominal aorta and the iliofemoral arteries with calcified and noncalcified plaque. Stenosis involving the infrarenal abdominal aorta. Atherosclerosis at the origins of the celiac and SMA without visible stenosis. Atherosclerosis at the origins of the renal arteries. Possible hemodynamically significant stenosis involving the LEFT common femoral artery. No pathologic lymphadenopathy. Reproductive: Surgically absent uterus. No adnexal masses. Other: None. Musculoskeletal: Facet degenerative changes involving the LOWER lumbar  spine. Mild degenerative changes in both hips. No acute findings. IMPRESSION: 1. No acute abnormalities involving the abdomen or pelvis. 2. Mild diffuse hepatic steatosis without focal hepatic parenchymal abnormality. 3. Severe generalized atherosclerosis with calcified and noncalcified plaque. 4. Possible significant stenosis involving the mid aorta. Possible significant stenosis involving the LEFT common femoral artery. Mild stenosis at the origin of the SMA which does not appear critical. 5.  Aortic Atherosclerosis, severe.  (ICD10-170.0) 6. Scattered colonic diverticula without evidence of acute diverticulitis. Results were discussed directly by telephone with Dr. Alphonzo Lemmings of the emergency department at the time of interpretation on 06/13/2018 at 7:55 p.m. Electronically Signed   By: Hulan Saas M.D.   On: 06/13/2018 19:57   Assessment/Plan The patient is a 63 year old female with a past medical history of hypertension, diabetes and active tobacco use who presented to the [facility] emergency department yesterday complaining of progressively  worsening abdominal pain found to have severe atherosclerotic disease of the aorto-iliac / femoral system bilaterally 1. 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  Patient complaining of what sounds like intermittent claudication-like symptoms.  Denies any rest pain or ulcer formation to the bilateral legs.  On physical exam the pedal legs are warm however pedal pulses are not palpable.  Reviewed CT images with Dr. Wyn Quaker / Schnier.  At this time, there is no indication for emergent endovascular/open intervention.  Patient may need an aortobifemoral bypass in the future.  [place]han abdominal pain is most likely due to possible gastritis / ulcer.  We will plan on seeing the patient in the office and continuing her work-up.  I have discussed this with the patient and her family members at the bedside.  The patient is in agreement with the plan. 2. Tobacco Abuse: We had a discussion for approximately three minutes regarding the absolute need for smoking cessation due to the deleterious nature of tobacco on the vascular system. We discussed the tobacco use would diminish patency of any intervention, and likely significantly worsen progressio of disease. We discussed multiple agents for quitting including replacement therapy or medications to reduce cravings such as Chantix. The patient voices their understanding of the importance of smoking cessation. 3. Hyperlipidemia: Encouraged good control as its slows the progression of atherosclerotic disease 4. Diabetes: Encouraged good control as its slows the progression of atherosclerotic disease 5. Hypertension: Encouraged good control as its slows the progression of atherosclerotic disease  Discussed with Dr. Wyn Quaker and Dr. Romie Jumper, PA-C  06/14/2018 2:01 PM  This note was created with Dragon medical transcription system.  Any error is purely unintentional

## 2018-06-15 ENCOUNTER — Inpatient Hospital Stay: Payer: Self-pay | Admitting: Certified Registered"

## 2018-06-15 ENCOUNTER — Encounter
Admission: EM | Disposition: A | Discharge status: Home / Self Care | Payer: Self-pay | Source: Home / Self Care | Attending: Internal Medicine

## 2018-06-15 ENCOUNTER — Other Ambulatory Visit: Payer: Self-pay

## 2018-06-15 HISTORY — PX: ESOPHAGOGASTRODUODENOSCOPY (EGD) WITH PROPOFOL: SHX5813

## 2018-06-15 LAB — HIV ANTIBODY (ROUTINE TESTING W REFLEX): HIV Screen 4th Generation wRfx: NONREACTIVE

## 2018-06-15 LAB — BASIC METABOLIC PANEL
Anion gap: 6 (ref 5–15)
BUN: 8 mg/dL (ref 8–23)
CO2: 23 mmol/L (ref 22–32)
Calcium: 8.7 mg/dL — ABNORMAL LOW (ref 8.9–10.3)
Chloride: 113 mmol/L — ABNORMAL HIGH (ref 98–111)
Creatinine, Ser: 0.63 mg/dL (ref 0.44–1.00)
GFR calc Af Amer: >60 mL/min (ref >60)
GFR calc non Af Amer: >60 mL/min (ref >60)
Glucose, Bld: 109 mg/dL — ABNORMAL HIGH (ref 70–99)
Potassium: 3.8 mmol/L (ref 3.5–5.1)
Sodium: 142 mmol/L (ref 135–145)

## 2018-06-15 LAB — GLUCOSE, CAPILLARY
Glucose-Capillary: 118 mg/dL — ABNORMAL HIGH (ref 70–99)
Glucose-Capillary: 96 mg/dL (ref 70–99)
Glucose-Capillary: 167 mg/dL — ABNORMAL HIGH (ref 70–99)
Glucose-Capillary: 145 mg/dL — ABNORMAL HIGH (ref 70–99)

## 2018-06-15 LAB — CBC
HCT: 30.9 % — ABNORMAL LOW (ref 36.0–46.0)
Hemoglobin: 10.6 g/dL — ABNORMAL LOW (ref 12.0–15.0)
MCH: 28.8 pg (ref 26.0–34.0)
MCHC: 34.3 g/dL (ref 30.0–36.0)
MCV: 84 fL (ref 80.0–100.0)
Platelets: 257 10*3/uL (ref 150–400)
RBC: 3.68 MIL/uL — ABNORMAL LOW (ref 3.87–5.11)
RDW: 13.2 % (ref 11.5–15.5)
WBC: 9.4 10*3/uL (ref 4.0–10.5)
nRBC: 0 % (ref 0.0–0.2)

## 2018-06-15 LAB — MAGNESIUM: Magnesium: 1.7 mg/dL (ref 1.7–2.4)

## 2018-06-15 SURGERY — ESOPHAGOGASTRODUODENOSCOPY (EGD) WITH PROPOFOL
Anesthesia: General

## 2018-06-15 MED ORDER — GLYCOPYRROLATE 0.2 MG/ML IJ SOLN
INTRAMUSCULAR | Status: DC | PRN
  Administered 2018-06-15: 0.2 mg via INTRAVENOUS

## 2018-06-15 MED ORDER — OXYCODONE-ACETAMINOPHEN 5-325 MG PO TABS
1.0000 | ORAL_TABLET | ORAL | Status: DC | PRN
  Administered 2018-06-15 – 2018-06-16 (×3): 1 via ORAL
  Filled 2018-06-15 (×3): qty 1

## 2018-06-15 MED ORDER — LIDOCAINE HCL (CARDIAC) PF 100 MG/5ML IV SOSY
PREFILLED_SYRINGE | INTRAVENOUS | Status: DC | PRN
  Administered 2018-06-15: 50 mg via INTRATRACHEAL

## 2018-06-15 MED ORDER — SODIUM CHLORIDE 0.9 % IV SOLN: INTRAVENOUS | Status: DC

## 2018-06-15 MED ORDER — PROPOFOL 10 MG/ML IV BOLUS
INTRAVENOUS | Status: DC | PRN
  Administered 2018-06-15 (×2): 20 mg via INTRAVENOUS
  Administered 2018-06-15: 50 mg via INTRAVENOUS
  Administered 2018-06-15: 20 mg via INTRAVENOUS

## 2018-06-15 NOTE — Anesthesia Post-op Follow-up Note (Signed)
Anesthesia QCDR form completed.        

## 2018-06-15 NOTE — Op Note (Signed)
[facility] Gastroenterology Patient Name: Sylvia SensingRoberta Uphoff Procedure Date: 06/15/2018 10:42 AM MRN: [id number] Account #: [id number] Date of Birth: 09/13/1955 Admit Type: Inpatient Age: 63 Room: [facility] ENDO ROOM 3 Gender: Female Note Status: Finalized Procedure:            Upper GI endoscopy Indications:          Epigastric abdominal pain, Nausea with vomiting Providers:            Boykin Nearingeodoro K. Sebastiano Luecke MD, MD Medicines:            Propofol per Anesthesia Complications:        No immediate complications. Procedure:            Pre-Anesthesia Assessment:                       - The risks and benefits of the procedure and the                        sedation options and risks were discussed with the                        patient. All questions were answered and informed                        consent was obtained.                       - Patient identification and proposed procedure were                        verified prior to the procedure by the nurse. The                        procedure was verified in the procedure room.                       - ASA Grade Assessment: III - A patient with severe                        systemic disease.                       - After reviewing the risks and benefits, the patient                        was deemed in satisfactory condition to undergo the                        procedure.                       After obtaining informed consent, the endoscope was                        passed under direct vision. Throughout the procedure,                        the patient's blood pressure, pulse, and oxygen                        saturations were monitored continuously. The Endoscope  was introduced through the mouth, and advanced to the                        third part of duodenum. The upper GI endoscopy was                        accomplished without difficulty. The patient tolerated                        the  procedure well. Findings:      The examined esophagus was normal.      Localized minimal inflammation characterized by erythema was found in       the gastric antrum. Biopsies were taken with a cold forceps for       Helicobacter pylori testing.      One non-bleeding cratered duodenal ulcer with no stigmata of bleeding       was found in the duodenal bulb. The lesion was 6 mm in largest dimension.      The exam was otherwise without abnormality. Impression:           - Normal esophagus.                       - Gastritis. Biopsied.                       - One non-bleeding duodenal ulcer with no stigmata of                        bleeding.                       - The examination was otherwise normal. Recommendation:       - Await pathology results.                       - Return patient to hospital ward for ongoing care.                       - Advance diet as tolerated. Procedure Code(s):    --- Professional ---                       [phone], Esophagogastroduodenoscopy, flexible, transoral;                        with biopsy, single or multiple Diagnosis Code(s):    --- Professional ---                       R11.2, Nausea with vomiting, unspecified                       R10.13, Epigastric pain                       K26.9, Duodenal ulcer, unspecified as acute or chronic,                        without hemorrhage or perforation                       K29.70, Gastritis, unspecified, without bleeding CPT copyright 2018 American Medical Association. All rights reserved.  The codes documented in this report are preliminary and upon coder review may  be revised to meet current compliance requirements. Stanton Kidneyeodoro K Marlenne Ridge MD, MD 06/15/2018 11:48:27 AM This report has been signed electronically. Number of Addenda: 0 Note Initiated On: 06/15/2018 10:42 AM      [facility]

## 2018-06-15 NOTE — Progress Notes (Signed)
Went in to check on patient and patient was moaning in pain. Educated and explained importance of notifying nurse when in pain to keep pain level under control. Administered IV Morphine per PRN order. Will continue to monitor patient to end of shift.

## 2018-06-15 NOTE — Transfer of Care (Signed)
Immediate Anesthesia Transfer of Care Note  Patient: Sylvia [name]  Procedure(s) Performed: ESOPHAGOGASTRODUODENOSCOPY (EGD) WITH PROPOFOL (N/A )  Patient Location: Endoscopy Unit  Anesthesia Type:General  Level of Consciousness: awake, alert  and patient cooperative  Airway & Oxygen Therapy: Patient Spontanous Breathing and Patient connected to face mask oxygen  Post-op Assessment: Report given to RN and Post -op Vital signs reviewed and stable  Post vital signs: Reviewed  Last Vitals:  Vitals Value Taken Time  BP 151/118 06/15/2018 11:53 AM  Temp    Pulse 106 06/15/2018 11:55 AM  Resp 19 06/15/2018 11:52 AM  SpO2 99 % 06/15/2018 11:55 AM  Vitals shown include unvalidated device data.  Last Pain:  Vitals:   06/15/18 0749  TempSrc:   PainSc: 8       Patients Stated Pain Goal: 3 (06/15/18 0749)  Complications: No apparent anesthesia complications

## 2018-06-15 NOTE — Anesthesia Preprocedure Evaluation (Signed)
Anesthesia Evaluation  Patient identified by MRN, date of birth, ID band Patient awake    Reviewed: Allergy & Precautions, H&P , NPO status , Patient's Chart, lab work & pertinent test results, reviewed documented beta blocker date and time   Airway Mallampati: II   Neck ROM: full    Dental  (+) Poor Dentition   Pulmonary neg pulmonary ROS, Current Smoker,    Pulmonary exam normal        Cardiovascular Exercise Tolerance: Poor hypertension, On Medications negative cardio ROS Normal cardiovascular exam Rhythm:regular Rate:Normal     Neuro/Psych negative neurological ROS  negative psych ROS   GI/Hepatic negative GI ROS, Neg liver ROS,   Endo/Other  negative endocrine ROSdiabetes, Well Controlled  Renal/GU negative Renal ROS  negative genitourinary   Musculoskeletal   Abdominal   Peds  Hematology negative hematology ROS (+)   Anesthesia Other Findings Past Medical History: No date: Diabetes mellitus without complication (HCC) No date: Hypertension Past Surgical History: No date: ABDOMINAL HYSTERECTOMY No date: CHOLECYSTECTOMY BMI    Body Mass Index:  22.84 kg/m     Reproductive/Obstetrics negative OB ROS                             Anesthesia Physical Anesthesia Plan  ASA: III  Anesthesia Plan: General   Post-op Pain Management:    Induction:   PONV Risk Score and Plan:   Airway Management Planned:   Additional Equipment:   Intra-op Plan:   Post-operative Plan:   Informed Consent: I have reviewed the patients History and Physical, chart, labs and discussed the procedure including the risks, benefits and alternatives for the proposed anesthesia with the patient or authorized representative who has indicated his/her understanding and acceptance.     Dental Advisory Given  Plan Discussed with: CRNA  Anesthesia Plan Comments:         Anesthesia Quick  Evaluation

## 2018-06-15 NOTE — Progress Notes (Signed)
Pt alert, verbal with no noted distress. Pt educated. Report provided to unit nurse, Asher Muir, RN. Pt to return back to unit.

## 2018-06-15 NOTE — Progress Notes (Signed)
   06/15/18 1500  Clinical Encounter Type  Visited With Patient  Visit Type Follow-up  Referral From Nurse  Spiritual Encounters  Spiritual Needs Brochure   Chaplain received an OR to update an AD. Patient was in bed with her father at the bedside having lunch. She confirmed interest in the AD and wanting to complete the information soon. Chaplain provided education, then the patient received a phone call that she needed to take. Chaplain offered to return to continue education in a few minutes. After return, the chaplain talked briefly about what brought the patient to the hospital and her improvement. She will ask her nurse to call or page if questions arise.

## 2018-06-15 NOTE — Plan of Care (Signed)
  Problem: Spiritual Needs Goal: Ability to function at adequate level Outcome: Progressing   Problem: Education: Goal: Knowledge of General Education information will improve Description Including pain rating scale, medication(s)/side effects and non-pharmacologic comfort measures Outcome: Progressing   Problem: Health Behavior/Discharge Planning: Goal: Ability to manage health-related needs will improve Outcome: Progressing   Problem: Clinical Measurements: Goal: Ability to maintain clinical measurements within normal limits will improve Outcome: Progressing Goal: Will remain free from infection Outcome: Progressing Goal: Diagnostic test results will improve Outcome: Progressing Goal: Respiratory complications will improve Outcome: Progressing Goal: Cardiovascular complication will be avoided Outcome: Progressing   Problem: Activity: Goal: Risk for activity intolerance will decrease Outcome: Progressing   Problem: Nutrition: Goal: Adequate nutrition will be maintained Outcome: Progressing   Problem: Coping: Goal: Level of anxiety will decrease Outcome: Progressing   Problem: Elimination: Goal: Will not experience complications related to bowel motility Outcome: Progressing Goal: Will not experience complications related to urinary retention Outcome: Progressing   Problem: Pain Managment: Goal: General experience of comfort will improve Outcome: Progressing   Problem: Safety: Goal: Ability to remain free from injury will improve Outcome: Progressing   Problem: Skin Integrity: Goal: Risk for impaired skin integrity will decrease Outcome: Progressing   

## 2018-06-15 NOTE — Interval H&P Note (Signed)
History and Physical Interval Note:  06/15/2018 11:38 AM  Sylvia [name]  has presented today for surgery, with the diagnosis of Epigastric pain, nausea and vomiting  The various methods of treatment have been discussed with the patient and family. After consideration of risks, benefits and other options for treatment, the patient has consented to  Procedure(s): ESOPHAGOGASTRODUODENOSCOPY (EGD) WITH PROPOFOL (N/A) as a surgical intervention .  The patient's history has been reviewed, patient examined, no change in status, stable for surgery.  I have reviewed the patient's chart and labs.  Questions were answered to the patient's satisfaction.     [place], [place]

## 2018-06-15 NOTE — Progress Notes (Signed)
   06/15/18 1100  Clinical Encounter Type  Visited With Family;Patient not available  Visit Type Initial  Referral From Nurse   Chaplain received an OR to complete or update an AD. Patient currently having a procedure and unavailable at this time. Chaplain talked briefly with her sister-in-law who indicated that she should return around noon. Will follow up this afternoon.

## 2018-06-15 NOTE — Progress Notes (Signed)
[employer] [facility] - [place] [facility]   PATIENT NAME: Sylvia [name]    MR#:  [id number]  DATE OF BIRTH:  08/06/1955  SUBJECTIVE:  CHIEF COMPLAINT:   Chief Complaint  Patient presents with  . Weakness  . Abdominal Pain   No new complaints at the time of my evaluation.  No fevers.  Nausea and vomiting and abdominal pains appear resolved.  Patient seen by gastroenterologist on scheduled for EGD today. Marland Kitchen  REVIEW OF SYSTEMS:  Review of Systems  Constitutional: Negative for chills and fever.  HENT: Negative for hearing loss and tinnitus.   Eyes: Negative for blurred vision and double vision.  Respiratory: Negative for cough and hemoptysis.   Cardiovascular: Negative for chest pain and palpitations.  Gastrointestinal:       Nausea with vomiting and abdominal pains improved  Genitourinary: Negative for dysuria and urgency.  Musculoskeletal: Negative for myalgias and neck pain.  Skin: Negative for itching and rash.  Neurological: Negative for dizziness and headaches.  Psychiatric/Behavioral: Negative for depression and substance abuse.      DRUG ALLERGIES:   Allergies  Allergen Reactions  . Penicillins Hives    Has patient had a PCN reaction causing immediate rash, facial/tongue/throat swelling, SOB or lightheadedness with hypotension: Yes Has patient had a PCN reaction causing severe rash involving mucus membranes or skin necrosis: No Has patient had a PCN reaction that required hospitalization: No Has patient had a PCN reaction occurring within the last 10 years: No If all of the above answers are "NO", then may proceed with Cephalosporin use.   VITALS:  Blood pressure (!) 132/59, pulse 94, temperature (!) 97 F (36.1 C), temperature source Tympanic, resp. rate (!) 22, height 5\' 6"  (1.676 m), weight 64.2 kg, SpO2 99 %. PHYSICAL EXAMINATION:   Physical Exam  Constitutional: She is oriented to person, place, and time. She appears well-developed and  well-nourished.  HENT:  Head: Normocephalic and atraumatic.  Mouth/Throat: Oropharynx is clear and moist.  Eyes: Pupils are equal, round, and reactive to light. Conjunctivae and EOM are normal.  Neck: Normal range of motion. Neck supple. No tracheal deviation present.  Cardiovascular: Normal rate and regular rhythm.  Respiratory: Effort normal and breath sounds normal. She has no wheezes.  GI: Soft. Bowel sounds are normal.  Musculoskeletal: Normal range of motion.        General: No edema.  Neurological: She is alert and oriented to person, place, and time. She has normal reflexes.  Skin: Skin is warm. She is not diaphoretic. No erythema.  Psychiatric: She has a normal mood and affect. Thought content normal.   LABORATORY PANEL:  Female CBC Recent Labs  Lab 06/15/18 0442  WBC 9.4  HGB 10.6*  HCT 30.9*  PLT 257   ------------------------------------------------------------------------------------------------------------------ Chemistries  Recent Labs  Lab 06/13/18 1313  06/15/18 0442  NA 139   < > 142  K 3.8   < > 3.8  CL 107   < > 113*  CO2 22   < > 23  GLUCOSE 288*   < > 109*  BUN 11   < > 8  CREATININE 0.80   < > 0.63  CALCIUM 10.0   < > 8.7*  MG  --   --  1.7  AST 15  --   --   ALT 13  --   --   ALKPHOS 94  --   --   BILITOT 1.0  --   --    < > =  values in this interval not displayed.   RADIOLOGY:  No results found. ASSESSMENT AND PLAN:   1.  Acute gastritis  Patient presented with complaints of epigastric pains , nausea and vomiting.   Symptoms already significantly improved.  Was placed on IV fluids and IV Protonix.  Initially placed on clear liquid diet. Patient seen by gastroenterologist and had EGD done today.  Reviewed normal esophagus with evidence of gastritis which was biopsied for H. pylori testing.  One nonbleeding duodenal ulcer with no stigmata of bleeding was found in the duodenal bulb.  Follow-up with gastroenterology for biopsy results on  discharge from the hospital. Diet to be resumed on gradually advanced as tolerated  2.   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  Patient complained of what sounds like intermittent claudication-like symptoms.   Patient seen by vascular surgery service.  Appreciate input.  At this time, there is no indication for emergent endovascular/open intervention.  Patient may need an aortobifemoral bypass in the future.  Recommendation is for outpatient follow-up with vascular surgery for further work-up as outpatient post discharge from the hospital Elevated lactic acid level on presentation likely due to dehydration.  Resolved with IV fluids.    3.  Hypertension Blood pressure fairly controlled.  Monitor  4.  Diabetes mellitus type 2 Continue insulin sliding scale coverage Monitor and adjust regimen  DVT prophylaxis; Lovenox  Disposition; anticipate discharge tomorrow if tolerating diet well.  All the records are reviewed and case discussed with Care Management/Social Worker. Management plans discussed with the patient, family and they are in agreement.  CODE STATUS: Full Code  TOTAL TIME TAKING CARE OF THIS PATIENT: 26 minutes.   More than 50% of the time was spent in counseling/coordination of care: YES  POSSIBLE D/C IN 1 DAY, DEPENDING ON CLINICAL CONDITION.   Laurel Smeltz M.D on 06/15/2018 at 1:45 PM  Between 7am to 6pm - Pager - [phone]  After 6pm go to [email or web address] - Scientist, research (life sciences) [facility]  Office  [phone]  CC: Primary care physician; Center, [facility]  Note: This dictation was prepared with Nurse, children's dictation along with smaller phrase technology. Any transcriptional errors that result from this process are unintentional.

## 2018-06-15 NOTE — Progress Notes (Signed)
Peripheral IV begin leaking fluid. Removed PIV from the [facility] and attempted a new Peripheral IV with no success. Paged IV Team consult. Peripheral IV was inserted into back of right forearm. Patient tolerated without discomfort. Notified oncoming Nurse.   Also explained importance of notifying the nurse when in pain to help promote healing and to control pain. Mbr undestood. Notified Cabin crew.

## 2018-06-16 ENCOUNTER — Encounter: Payer: Self-pay | Admitting: Internal Medicine

## 2018-06-16 LAB — BASIC METABOLIC PANEL
Anion gap: 4 — ABNORMAL LOW (ref 5–15)
BUN: 8 mg/dL (ref 8–23)
CO2: 23 mmol/L (ref 22–32)
Calcium: 8.4 mg/dL — ABNORMAL LOW (ref 8.9–10.3)
Chloride: 114 mmol/L — ABNORMAL HIGH (ref 98–111)
Creatinine, Ser: 0.8 mg/dL (ref 0.44–1.00)
GFR calc Af Amer: >60 mL/min (ref >60)
GFR calc non Af Amer: >60 mL/min (ref >60)
Glucose, Bld: 162 mg/dL — ABNORMAL HIGH (ref 70–99)
Potassium: 3.3 mmol/L — ABNORMAL LOW (ref 3.5–5.1)
Sodium: 141 mmol/L (ref 135–145)

## 2018-06-16 LAB — MAGNESIUM: Magnesium: 1.5 mg/dL — ABNORMAL LOW (ref 1.7–2.4)

## 2018-06-16 LAB — SURGICAL PATHOLOGY

## 2018-06-16 LAB — GLUCOSE, CAPILLARY
Glucose-Capillary: 137 mg/dL — ABNORMAL HIGH (ref 70–99)
Glucose-Capillary: 128 mg/dL — ABNORMAL HIGH (ref 70–99)

## 2018-06-16 MED ORDER — PANTOPRAZOLE SODIUM 40 MG PO TBEC: 40.0000 mg | DELAYED_RELEASE_TABLET | Freq: Every day | ORAL | 0 refills | Status: DC

## 2018-06-16 MED ORDER — MAGNESIUM SULFATE 2 GM/50ML IV SOLN
2.0000 g | Freq: Once | INTRAVENOUS | Status: AC
  Administered 2018-06-16: 2 g via INTRAVENOUS
  Filled 2018-06-16: qty 50

## 2018-06-16 MED ORDER — ONDANSETRON HCL 4 MG PO TABS: 4.0000 mg | ORAL_TABLET | Freq: Four times a day (QID) | ORAL | 0 refills | Status: DC | PRN

## 2018-06-16 MED ORDER — POTASSIUM CHLORIDE CRYS ER 20 MEQ PO TBCR
40.0000 meq | EXTENDED_RELEASE_TABLET | Freq: Once | ORAL | Status: AC
  Administered 2018-06-16: 40 meq via ORAL
  Filled 2018-06-16: qty 2

## 2018-06-16 MED ORDER — ALUM & MAG HYDROXIDE-SIMETH 200-200-20 MG/5ML PO SUSP: 30.0000 mL | ORAL | 0 refills | Status: DC | PRN

## 2018-06-16 MED ORDER — ALUM & MAG HYDROXIDE-SIMETH 200-200-20 MG/5ML PO SUSP
30.0000 mL | ORAL | Status: DC | PRN
  Administered 2018-06-16: 30 mL via ORAL
  Filled 2018-06-16: qty 30

## 2018-06-16 NOTE — Discharge Summary (Signed)
[facility] - [place] [facility]   PATIENT NAME: Sylvia [name]    MR#:  [id number]  DATE OF BIRTH:  [birth year]  DATE OF ADMISSION:  06/13/2018   ADMITTING PHYSICIAN: Oralia Manis, MD  DATE OF DISCHARGE: 06/16/2018  PRIMARY CARE PHYSICIAN: [facility]   ADMISSION DIAGNOSIS:  Lactic acid acidosis [E87.2] Diarrhea, unspecified type [R19.7] Nausea and vomiting, intractability of vomiting not specified, unspecified vomiting type [R11.2] DISCHARGE DIAGNOSIS:  Principal Problem:   Abdominal pain Active Problems:   HTN (hypertension)   Diabetes (HCC)   Elevated lactic acid level  SECONDARY DIAGNOSIS:   Past Medical History:  Diagnosis Date  . Diabetes mellitus without complication (HCC)   . Hypertension    HOSPITAL COURSE:  Chief complaint; abdominal pains  History of presenting complaint; Sylvia [name]  is a 63 y.o. female who presented with chief complaint of abdominal pain.  She stated that she has had this pain for between 36 to 48 hours.  Her pain is epigastric, sharp.  It is associated with nausea and profuse vomiting over the last 24 hours.  She states that she has been told in the past she had an ulcer, but states that she has never had endoscopy performed.  Here in the ED she was found to have an elevated lactic acid, greater than 4.  On CTA imaging she also was found to have significant mid aortic and femoral stenosis due to calcification and plaque.  Hospitalist were called for admission and further evaluation.  Please refer to the H&P dictated for further details.   Hospital course; 1.  Acute gastritis  Patient presented with complaints of epigastric pains , nausea and vomiting.    Symptoms completely resolved.  No more nausea or vomiting.  Currently denies any abdominal pains.  Was placed on PPI.Patient seen by gastroenterologist and had EGD done .  Reviewed normal esophagus with evidence of gastritis which was  biopsied for H. pylori testing.  One nonbleeding duodenal ulcer with no stigmata of bleeding was found in the duodenal bulb.  Follow-up with gastroenterology for biopsy results on discharge from the hospital. Patient tolerating regular consistency diet prior to discharge.  2.  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 Patient complained of what sounds like intermittent claudication-like symptoms.  Patient seen by vascular surgery service.  Appreciate input.At this time, there is no indication for emergent endovascular/open intervention. Patient may need an aortobifemoralbypass in the future.  Recommendation is for outpatient follow-up with vascular surgery for further work-up as outpatient post discharge from the hospital.  Appointment already made to follow-up with vascular surgery next month. Elevated lactic acid level on presentation likely due to dehydration.  Resolved with IV fluids.    3.  Hypertension Blood pressure fairly controlled.  Monitor  4.  Diabetes mellitus type 2 Resumed home regimen.  DISCHARGE CONDITIONS:  Stable CONSULTS OBTAINED:  Treatment Team:  Stanton Kidney, MD Jama Flavors, MD DRUG ALLERGIES:   Allergies  Allergen Reactions  . Penicillins Hives    Has patient had a PCN reaction causing immediate rash, facial/tongue/throat swelling, SOB or lightheadedness with hypotension: Yes Has patient had a PCN reaction causing severe rash involving mucus membranes or skin necrosis: No Has patient had a PCN reaction that required hospitalization: No Has patient had a PCN reaction occurring within the last 10 years: No If all of the above answers are "NO", then may proceed with Cephalosporin  use.   DISCHARGE MEDICATIONS:   Allergies as of 06/16/2018      Reactions   Penicillins Hives   Has patient had a PCN reaction causing immediate rash, facial/tongue/throat swelling, SOB or  lightheadedness with hypotension: Yes Has patient had a PCN reaction causing severe rash involving mucus membranes or skin necrosis: No Has patient had a PCN reaction that required hospitalization: No Has patient had a PCN reaction occurring within the last 10 years: No If all of the above answers are "NO", then may proceed with Cephalosporin use.      Medication List    TAKE these medications   albuterol 108 (90 Base) MCG/ACT inhaler Commonly known as:  PROVENTIL HFA;VENTOLIN HFA Inhale 2 puffs into the lungs every 6 (six) hours as needed for wheezing or shortness of breath.   alum & mag hydroxide-simeth 200-200-20 MG/5ML suspension Commonly known as:  MAALOX/MYLANTA Take 30 mLs by mouth every 4 (four) hours as needed for indigestion or heartburn.   ondansetron 4 MG tablet Commonly known as:  ZOFRAN Take 1 tablet (4 mg total) by mouth every 6 (six) hours as needed for nausea.   pantoprazole 40 MG tablet Commonly known as:  PROTONIX Take 1 tablet (40 mg total) by mouth daily. Start taking on:  June 17, 2018   ranitidine 150 MG tablet Commonly known as:  ZANTAC Take 150 mg by mouth daily.        DISCHARGE INSTRUCTIONS:   DIET:  Diabetic diet DISCHARGE CONDITION:  Stable ACTIVITY:  Activity as tolerated OXYGEN:  Home Oxygen: No.  Oxygen Delivery: room air DISCHARGE LOCATION:  home   If you experience worsening of your admission symptoms, develop shortness of breath, life threatening emergency, suicidal or homicidal thoughts you must seek medical attention immediately by calling 911 or calling your MD immediately  if symptoms less severe.  You Must read complete instructions/literature along with all the possible adverse reactions/side effects for all the Medicines you take and that have been prescribed to you. Take any new Medicines after you have completely understood and accpet all the possible adverse reactions/side effects.   Please note  You were cared  for by a hospitalist during your hospital stay. If you have any questions about your discharge medications or the care you received while you were in the hospital after you are discharged, you can call the unit and asked to speak with the hospitalist on call if the hospitalist that took care of you is not available. Once you are discharged, your primary care physician will handle any further medical issues. Please note that NO REFILLS for any discharge medications will be authorized once you are discharged, as it is imperative that you return to your primary care physician (or establish a relationship with a primary care physician if you do not have one) for your aftercare needs so that they can reassess your need for medications and monitor your lab values.    On the day of Discharge:  VITAL SIGNS:  Blood pressure 129/67, pulse 82, temperature (!) 97.4 F (36.3 C), temperature source Oral, resp. rate 18, height 5\' 6"  (1.676 m), weight 64.2 kg, SpO2 95 %. PHYSICAL EXAMINATION:  GENERAL:  63 y.o.-year-old patient lying in the bed with no acute distress.  EYES: Pupils equal, round, reactive to light and accommodation. No scleral icterus. Extraocular muscles intact.  HEENT: Head atraumatic, normocephalic. Oropharynx and nasopharynx clear.  NECK:  Supple, no jugular venous distention. No thyroid enlargement, no tenderness.  LUNGS: Normal  breath sounds bilaterally, no wheezing, rales,rhonchi or crepitation. No use of accessory muscles of respiration.  CARDIOVASCULAR: S1, S2 normal. No murmurs, rubs, or gallops.  ABDOMEN: Soft, non-tender, non-distended. Bowel sounds present. No organomegaly or mass.  EXTREMITIES: No pedal edema, cyanosis, or clubbing.  NEUROLOGIC: Cranial nerves II through XII are intact. Muscle strength 5/5 in all extremities. Sensation intact. Gait not checked.  PSYCHIATRIC: The patient is alert and oriented x 3.  SKIN: No obvious rash, lesion, or ulcer.  DATA REVIEW:    CBC Recent Labs  Lab 06/15/18 0442  WBC 9.4  HGB 10.6*  HCT 30.9*  PLT 257    Chemistries  Recent Labs  Lab 06/13/18 1313  06/16/18 0435  NA 139   < > 141  K 3.8   < > 3.3*  CL 107   < > 114*  CO2 22   < > 23  GLUCOSE 288*   < > 162*  BUN 11   < > 8  CREATININE 0.80   < > 0.80  CALCIUM 10.0   < > 8.4*  MG  --    < > 1.5*  AST 15  --   --   ALT 13  --   --   ALKPHOS 94  --   --   BILITOT 1.0  --   --    < > = values in this interval not displayed.     Microbiology Results  No results found for this or any previous visit.  RADIOLOGY:  No results found.   Management plans discussed with the patient, family and they are in agreement.  CODE STATUS: Full Code   TOTAL TIME TAKING CARE OF THIS PATIENT: 36 minutes.    Allie Gerhold M.D on 06/16/2018 at 10:41 AM  Between 7am to 6pm - Pager - [phone]  After 6pm go to [email or web address] - Scientist, research (life sciences) [facility]  Office  [phone]  CC: Primary care physician; [facility]   Note: This dictation was prepared with Nurse, children's dictation along with smaller phrase technology. Any transcriptional errors that result from this process are unintentional.

## 2018-06-19 NOTE — Anesthesia Postprocedure Evaluation (Signed)
Anesthesia Post Note  Patient: Sylvia [name]  Procedure(s) Performed: ESOPHAGOGASTRODUODENOSCOPY (EGD) WITH PROPOFOL (N/A )  Patient location during evaluation: Endoscopy Anesthesia Type: General Level of consciousness: awake and alert and oriented Pain management: pain level controlled Vital Signs Assessment: post-procedure vital signs reviewed and stable Respiratory status: spontaneous breathing Cardiovascular status: blood pressure returned to baseline Anesthetic complications: no     Last Vitals:  Vitals:   06/16/18 0754 06/16/18 0952  BP: (!) 146/71 129/67  Pulse: 74 82  Resp: 18   Temp: 37.2 C (!) 36.3 C  SpO2: 95%     Last Pain:  Vitals:   06/16/18 0952  TempSrc: Oral  PainSc:                  Landra Howze

## 2018-07-13 ENCOUNTER — Other Ambulatory Visit (INDEPENDENT_AMBULATORY_CARE_PROVIDER_SITE_OTHER): Payer: Self-pay | Admitting: Vascular Surgery

## 2018-07-13 DIAGNOSIS — R0989 Other specified symptoms and signs involving the circulatory and respiratory systems: Secondary | ICD-10-CM

## 2018-07-14 ENCOUNTER — Other Ambulatory Visit: Payer: Self-pay

## 2018-07-14 ENCOUNTER — Encounter (INDEPENDENT_AMBULATORY_CARE_PROVIDER_SITE_OTHER): Payer: Self-pay | Admitting: Nurse Practitioner

## 2018-07-14 ENCOUNTER — Ambulatory Visit (INDEPENDENT_AMBULATORY_CARE_PROVIDER_SITE_OTHER): Payer: Self-pay | Admitting: Nurse Practitioner

## 2018-07-14 ENCOUNTER — Ambulatory Visit (INDEPENDENT_AMBULATORY_CARE_PROVIDER_SITE_OTHER): Payer: Self-pay

## 2018-07-14 VITALS — BP 157/80 | HR 94 | Resp 16 | Ht 67.0 in | Wt 144.0 lb

## 2018-07-14 DIAGNOSIS — F17210 Nicotine dependence, cigarettes, uncomplicated: Secondary | ICD-10-CM

## 2018-07-14 DIAGNOSIS — E1151 Type 2 diabetes mellitus with diabetic peripheral angiopathy without gangrene: Secondary | ICD-10-CM

## 2018-07-14 DIAGNOSIS — R0989 Other specified symptoms and signs involving the circulatory and respiratory systems: Secondary | ICD-10-CM

## 2018-07-14 DIAGNOSIS — Z79899 Other long term (current) drug therapy: Secondary | ICD-10-CM

## 2018-07-14 DIAGNOSIS — I10 Essential (primary) hypertension: Secondary | ICD-10-CM

## 2018-07-14 DIAGNOSIS — Z7902 Long term (current) use of antithrombotics/antiplatelets: Secondary | ICD-10-CM

## 2018-07-14 DIAGNOSIS — I739 Peripheral vascular disease, unspecified: Secondary | ICD-10-CM | POA: Insufficient documentation

## 2018-07-14 NOTE — Progress Notes (Signed)
SUBJECTIVE:  Patient ID: Sylvia [name], female    DOB: 05/20/1955, 63 y.o.   MRN: [id number] Chief Complaint  Patient presents with  . Follow-up    [facility] 46month abi    HPI  Sylvia [name] is a 63 y.o. female  The patient is seen for evaluation of painful lower extremities and diminished pulses. Patient notes the pain is always associated with activity and is very consistent day today. Typically, the pain occurs at less than one block, progress is as activity continues to the point that the patient must stop walking. Resting including standing still for several minutes allowed resumption of the activity and the ability to walk a similar distance before stopping again. Uneven terrain and inclined shorten the distance. The pain has been progressive over the past several years. The patient states the inability to walk is now having a profound negative impact on quality of life and daily activities.  The patient denies rest pain or dangling of an extremity off the side of the bed during the night for relief. No open wounds or sores at this time. No prior interventions or surgeries.  No history of back problems or DJD of the lumbar sacral spine.   The patient denies changes in claudication symptoms or new rest pain symptoms.  No new ulcers or wounds of the foot.  The patient's blood pressure has been stable and relatively well controlled. The patient denies amaurosis fugax or recent TIA symptoms. There are no recent neurological changes noted. The patient denies history of DVT, PE or superficial thrombophlebitis. The patient denies recent episodes of angina or shortness of breath.   The patient was previously a 3 pack a day smoker however she has decreased her smoking to 3 cigarettes a day.  She states that her right leg is worse than her left however claudication has been in both lower extremities.  She attempts to walk approximately 15 to 20 minutes a day.  She underwent bilateral  ABIs which revealed an ABI of 0.60 in her right lower extremity and 0.70 in her left.  She has monophasic tibial artery waveforms within her right and biphasic posterior tibial artery waveforms with monophasic anterior tibial artery waveforms.  She has dampened toe waveforms on the left first digit.  Past Medical History:  Diagnosis Date  . Diabetes mellitus without complication (HCC)   . Hypertension     Past Surgical History:  Procedure Laterality Date  . ABDOMINAL HYSTERECTOMY    . CHOLECYSTECTOMY    . ESOPHAGOGASTRODUODENOSCOPY (EGD) WITH PROPOFOL N/A 06/15/2018   Procedure: ESOPHAGOGASTRODUODENOSCOPY (EGD) WITH PROPOFOL;  Surgeon: Toledo, Boykin Nearingeodoro K, MD;  Location: [facility] ENDOSCOPY;  Service: Gastroenterology;  Laterality: N/A;    Social History   Socioeconomic History  . Marital status: Divorced    Spouse name: Not on file  . Number of children: Not on file  . Years of education: Not on file  . Highest education level: Not on file  Occupational History  . Not on file  Social Needs  . Financial resource strain: Not hard at all  . Food insecurity:    Worry: Sometimes true    Inability: Sometimes true  . Transportation needs:    Medical: No    Non-medical: No  Tobacco Use  . Smoking status: Current Every Day Smoker  . Smokeless tobacco: Never Used  Substance and Sexual Activity  . Alcohol use: No    Frequency: Never  . Drug use: Never  . Sexual activity: Not Currently  Lifestyle  . Physical activity:    Days per week: 4 days    Minutes per session: 20 min  . Stress: Only a little  Relationships  . Social connections:    Talks on phone: Patient refused    Gets together: Patient refused    Attends religious service: Patient refused    Active member of club or organization: Patient refused    Attends meetings of clubs or organizations: Patient refused    Relationship status: Patient refused  . Intimate partner violence:    Fear of current or ex partner: Patient  refused    Emotionally abused: Patient refused    Physically abused: Patient refused    Forced sexual activity: Patient refused  Other Topics Concern  . Not on file  Social History Narrative  . Not on file    Family History  Problem Relation Age of Onset  . Breast cancer Mother 28    Allergies  Allergen Reactions  . Penicillins Hives    Has patient had a PCN reaction causing immediate rash, facial/tongue/throat swelling, SOB or lightheadedness with hypotension: Yes Has patient had a PCN reaction causing severe rash involving mucus membranes or skin necrosis: No Has patient had a PCN reaction that required hospitalization: No Has patient had a PCN reaction occurring within the last 10 years: No If all of the above answers are "NO", then may proceed with Cephalosporin use.     Review of Systems   Review of Systems: Negative Unless Checked Constitutional: [] Weight loss  [] Fever  [] Chills Cardiac: [] Chest pain   []  Atrial Fibrillation  [] Palpitations   [] Shortness of breath when laying flat   [] Shortness of breath with exertion. [] Shortness of breath at rest Vascular:  [x] Pain in legs with walking   [] Pain in legs with standing [] Pain in legs when laying flat   [x] Claudication    [] Pain in feet when laying flat    [] History of DVT   [] Phlebitis   [] Swelling in legs   [] Varicose veins   [] Non-healing ulcers Pulmonary:   [] Uses home oxygen   [] Productive cough   [] Hemoptysis   [] Wheeze  [] COPD   [] Asthma Neurologic:  [] Dizziness   [] Seizures  [] Blackouts [] History of stroke   [] History of TIA  [] Aphasia   [] Temporary Blindness   [] Weakness or numbness in arm   [] Weakness or numbness in leg Musculoskeletal:   [] Joint swelling   [] Joint pain   [] Low back pain  []  History of Knee Replacement [] Arthritis [] back Surgeries  []  Spinal Stenosis    Hematologic:  [] Easy bruising  [] Easy bleeding   [] Hypercoagulable state   [] Anemic Gastrointestinal:  [] Diarrhea   [] Vomiting  [] Gastroesophageal  reflux/heartburn   [] Difficulty swallowing. [x] Abdominal pain Genitourinary:  [] Chronic kidney disease   [] Difficult urination  [] Anuric   [] Blood in urine [] Frequent urination  [] Burning with urination   [] Hematuria Skin:  [] Rashes   [] Ulcers [] Wounds Psychological:  [x] History of anxiety   [x]  History of major depression  []  Memory Difficulties      OBJECTIVE:   Physical Exam  BP (!) 157/80 (BP Location: Right Arm)   Pulse 94   Resp 16   Ht 5\' 7"  (1.702 m)   Wt 144 lb (65.3 kg)   BMI 22.55 kg/m   Gen: WD/WN, NAD Head: Globe/AT, No temporalis wasting.  Ear/Nose/Throat: Hearing grossly intact, nares w/o erythema or drainage Eyes: PER, EOMI, sclera nonicteric.  Neck: Supple, no masses.  No JVD.  Pulmonary:  Good air movement, no use  of accessory muscles.  Cardiac: RRR Vascular:  Vessel Right Left  Radial Palpable Palpable  Dorsalis Pedis Non Palpable Non Palpable  Posterior Tibial Non Palpable Non Palpable   Gastrointestinal: soft, non-distended. No guarding/no peritoneal signs.  Musculoskeletal: M/S 5/5 throughout.  No deformity or atrophy.  Neurologic: Pain and light touch intact in extremities.  Symmetrical.  Speech is fluent. Motor exam as listed above. Psychiatric: Judgment intact, Mood & affect appropriate for pt's clinical situation. Dermatologic: No Venous rashes. No Ulcers Noted.  No changes consistent with cellulitis. Lymph : No Cervical lymphadenopathy, no lichenification or skin changes of chronic lymphedema.       ASSESSMENT AND PLAN:  1. PAD (peripheral artery disease) (HCC) Recommend:  The patient has experienced increased symptoms and is now describing lifestyle limiting claudication and mild rest pain.   Given the severity of the patient's lower extremity symptoms the patient should undergo angiography and intervention.  She should have the right lower extremity intervened on first followed by the left.  Risk and benefits were reviewed the patient.   Indications for the procedure were reviewed.  All questions were answered, the patient agrees to proceed.   The patient should continue walking and begin a more formal exercise program.  The patient should continue antiplatelet therapy and aggressive treatment of the lipid abnormalities  The patient will follow up with me after the angiogram.   Currently due to concerns from COVID-19, the angiogram will be performed sometime in May.  This is due to the attempt to decrease non-emergent/urgent cases with in the hospital.  I have discussed this the patient and she is okay with waiting.  We have discussed what would require her to contact us sooner such as development of breast pain, discoloration of her digits, wounds and ulcerations of her lower extremities.  The patient understands and is in agreement.  She will contact us should changes occur and her intervention needs to be done sooner. 2. Essential hypertension Continue antihypertensive medications as already ordered, these medications have been reviewed and there are no changes at this time.   3. Type 2 diabetes mellitus with diabetic peripheral angiopathy without gangrene, unspecified whether long term insulin use (HCC) Continue hypoglycemic medications as already ordered, these medications have been reviewed and there are no changes at this time.  Hgb A1C to be monitored as already arranged by primary service    Current Outpatient Medications on File Prior to Visit  Medication Sig Dispense Refill  . albuterol (PROVENTIL HFA;VENTOLIN HFA) 108 (90 Base) MCG/ACT inhaler Inhale 2 puffs into the lungs every 6 (six) hours as needed for wheezing or shortness of breath. 1 Inhaler 2  . alum & mag hydroxide-simeth (MAALOX/MYLANTA) 200-200-20 MG/5ML suspension Take 30 mLs by mouth every 4 (four) hours as needed for indigestion or heartburn. 355 mL 0  . ondansetron (ZOFRAN) 4 MG tablet Take 1 tablet (4 mg total) by mouth every 6 (six) hours as needed  for nausea. 20 tablet 0  . pantoprazole (PROTONIX) 40 MG tablet Take 1 tablet (40 mg total) by mouth daily. 30 tablet 0  . ranitidine (ZANTAC) 150 MG tablet Take 150 mg by mouth daily.     No current facility-administered medications on file prior to visit.     There are no Patient Instructions on file for this visit. No follow-ups on file.   Georgiana SpinnerFallon E Gaye Scorza, NP  This note was completed with Office managerDragon Dictation.  Any errors are purely unintentional.

## 2018-07-19 ENCOUNTER — Encounter (INDEPENDENT_AMBULATORY_CARE_PROVIDER_SITE_OTHER): Payer: Self-pay

## 2018-08-11 ENCOUNTER — Other Ambulatory Visit (INDEPENDENT_AMBULATORY_CARE_PROVIDER_SITE_OTHER): Payer: Self-pay | Admitting: Nurse Practitioner

## 2018-08-18 ENCOUNTER — Other Ambulatory Visit (INDEPENDENT_AMBULATORY_CARE_PROVIDER_SITE_OTHER): Payer: Self-pay | Admitting: Nurse Practitioner

## 2018-08-18 ENCOUNTER — Encounter
Admission: RE | Admit: 2018-08-18 | Discharge: 2018-08-18 | Disposition: A | Discharge status: Home / Self Care | Payer: Medicaid Other | Source: Ambulatory Visit | Attending: Vascular Surgery | Admitting: Vascular Surgery

## 2018-08-18 ENCOUNTER — Other Ambulatory Visit: Payer: Self-pay

## 2018-08-18 DIAGNOSIS — Z01812 Encounter for preprocedural laboratory examination: Secondary | ICD-10-CM | POA: Insufficient documentation

## 2018-08-18 HISTORY — DX: Anxiety disorder, unspecified: F41.9

## 2018-08-18 HISTORY — DX: Gastro-esophageal reflux disease without esophagitis: K21.9

## 2018-08-18 HISTORY — DX: Chronic obstructive pulmonary disease, unspecified: J44.9

## 2018-08-18 LAB — CREATININE, SERUM
Creatinine, Ser: 0.76 mg/dL (ref 0.44–1.00)
GFR calc Af Amer: >60 mL/min (ref >60)
GFR calc non Af Amer: >60 mL/min (ref >60)

## 2018-08-18 LAB — BUN: BUN: 13 mg/dL (ref 8–23)

## 2018-08-20 MED ORDER — CLINDAMYCIN PHOSPHATE 300 MG/50ML IV SOLN
300.0000 mg | Freq: Once | INTRAVENOUS | Status: AC
  Administered 2018-08-21: 300 mg via INTRAVENOUS

## 2018-08-21 ENCOUNTER — Ambulatory Visit
Admission: RE | Admit: 2018-08-21 | Discharge: 2018-08-21 | Disposition: A | Discharge status: Home / Self Care | Payer: Medicaid Other | Attending: Vascular Surgery | Admitting: Vascular Surgery

## 2018-08-21 ENCOUNTER — Encounter: Payer: Self-pay | Admitting: Anesthesiology

## 2018-08-21 ENCOUNTER — Encounter: Payer: Self-pay | Admitting: *Deleted

## 2018-08-21 ENCOUNTER — Other Ambulatory Visit (INDEPENDENT_AMBULATORY_CARE_PROVIDER_SITE_OTHER): Payer: Self-pay | Admitting: Nurse Practitioner

## 2018-08-21 ENCOUNTER — Encounter
Admission: RE | Disposition: A | Discharge status: Home / Self Care | Payer: Self-pay | Source: Home / Self Care | Attending: Vascular Surgery

## 2018-08-21 ENCOUNTER — Other Ambulatory Visit: Payer: Self-pay

## 2018-08-21 DIAGNOSIS — Z90710 Acquired absence of both cervix and uterus: Secondary | ICD-10-CM | POA: Insufficient documentation

## 2018-08-21 DIAGNOSIS — I70221 Atherosclerosis of native arteries of extremities with rest pain, right leg: Secondary | ICD-10-CM | POA: Insufficient documentation

## 2018-08-21 DIAGNOSIS — F17210 Nicotine dependence, cigarettes, uncomplicated: Secondary | ICD-10-CM | POA: Insufficient documentation

## 2018-08-21 DIAGNOSIS — Z7902 Long term (current) use of antithrombotics/antiplatelets: Secondary | ICD-10-CM | POA: Insufficient documentation

## 2018-08-21 DIAGNOSIS — E1151 Type 2 diabetes mellitus with diabetic peripheral angiopathy without gangrene: Secondary | ICD-10-CM | POA: Insufficient documentation

## 2018-08-21 DIAGNOSIS — Z79899 Other long term (current) drug therapy: Secondary | ICD-10-CM | POA: Insufficient documentation

## 2018-08-21 DIAGNOSIS — J449 Chronic obstructive pulmonary disease, unspecified: Secondary | ICD-10-CM | POA: Insufficient documentation

## 2018-08-21 DIAGNOSIS — I10 Essential (primary) hypertension: Secondary | ICD-10-CM | POA: Insufficient documentation

## 2018-08-21 DIAGNOSIS — K219 Gastro-esophageal reflux disease without esophagitis: Secondary | ICD-10-CM | POA: Insufficient documentation

## 2018-08-21 DIAGNOSIS — Z7982 Long term (current) use of aspirin: Secondary | ICD-10-CM | POA: Insufficient documentation

## 2018-08-21 DIAGNOSIS — I70219 Atherosclerosis of native arteries of extremities with intermittent claudication, unspecified extremity: Secondary | ICD-10-CM

## 2018-08-21 DIAGNOSIS — Z880 Allergy status to penicillin: Secondary | ICD-10-CM | POA: Insufficient documentation

## 2018-08-21 HISTORY — PX: LOWER EXTREMITY ANGIOGRAPHY: CATH118251

## 2018-08-21 LAB — GLUCOSE, CAPILLARY
Glucose-Capillary: 214 mg/dL — ABNORMAL HIGH (ref 70–99)
Glucose-Capillary: 227 mg/dL — ABNORMAL HIGH (ref 70–99)

## 2018-08-21 SURGERY — LOWER EXTREMITY ANGIOGRAPHY
Anesthesia: Moderate Sedation | Site: Leg Lower | Laterality: Right

## 2018-08-21 MED ORDER — MIDAZOLAM HCL 2 MG/ML PO SYRP: 8.0000 mg | ORAL_SOLUTION | Freq: Once | ORAL | Status: DC | PRN

## 2018-08-21 MED ORDER — CLOPIDOGREL BISULFATE 75 MG PO TABS: 75.0000 mg | ORAL_TABLET | Freq: Every day | ORAL | Status: DC

## 2018-08-21 MED ORDER — ASPIRIN EC 81 MG PO TBEC: 81.0000 mg | DELAYED_RELEASE_TABLET | Freq: Every day | ORAL | Status: DC

## 2018-08-21 MED ORDER — CLOPIDOGREL BISULFATE 75 MG PO TABS: 150.0000 mg | ORAL_TABLET | Freq: Once | ORAL | 11 refills | Status: DC

## 2018-08-21 MED ORDER — HYDRALAZINE HCL 20 MG/ML IJ SOLN
5.0000 mg | INTRAMUSCULAR | Status: DC | PRN
  Administered 2018-08-21: 5 mg via INTRAVENOUS

## 2018-08-21 MED ORDER — ACETAMINOPHEN 325 MG PO TABS: 650.0000 mg | ORAL_TABLET | ORAL | Status: DC | PRN

## 2018-08-21 MED ORDER — HYDRALAZINE HCL 20 MG/ML IJ SOLN
INTRAMUSCULAR | Status: AC
  Administered 2018-08-21: 5 mg via INTRAVENOUS
  Filled 2018-08-21: qty 1

## 2018-08-21 MED ORDER — CLOPIDOGREL BISULFATE 75 MG PO TABS
ORAL_TABLET | ORAL | Status: AC
  Filled 2018-08-21: qty 2

## 2018-08-21 MED ORDER — SODIUM CHLORIDE 0.9 % IV SOLN: 250.0000 mL | INTRAVENOUS | Status: DC | PRN

## 2018-08-21 MED ORDER — ONDANSETRON HCL 4 MG/2ML IJ SOLN: 4.0000 mg | Freq: Four times a day (QID) | INTRAMUSCULAR | Status: DC | PRN

## 2018-08-21 MED ORDER — ATORVASTATIN CALCIUM 10 MG PO TABS: 10.0000 mg | ORAL_TABLET | Freq: Every day | ORAL | 11 refills | Status: DC

## 2018-08-21 MED ORDER — HEPARIN SODIUM (PORCINE) 1000 UNIT/ML IJ SOLN
INTRAMUSCULAR | Status: DC | PRN
  Administered 2018-08-21: 5000 [IU] via INTRAVENOUS

## 2018-08-21 MED ORDER — CLOPIDOGREL BISULFATE 75 MG PO TABS: 75.0000 mg | ORAL_TABLET | Freq: Every day | ORAL | 11 refills | Status: DC

## 2018-08-21 MED ORDER — LABETALOL HCL 5 MG/ML IV SOLN: 10.0000 mg | INTRAVENOUS | Status: DC | PRN

## 2018-08-21 MED ORDER — CLOPIDOGREL BISULFATE 75 MG PO TABS: 150.0000 mg | ORAL_TABLET | Freq: Once | ORAL | 0 refills | Status: AC

## 2018-08-21 MED ORDER — ASPIRIN EC 81 MG PO TBEC: 81.0000 mg | DELAYED_RELEASE_TABLET | Freq: Every day | ORAL | 2 refills | Status: DC

## 2018-08-21 MED ORDER — SODIUM CHLORIDE 0.9% FLUSH: 3.0000 mL | INTRAVENOUS | Status: DC | PRN

## 2018-08-21 MED ORDER — ATORVASTATIN CALCIUM 10 MG PO TABS: 10.0000 mg | ORAL_TABLET | Freq: Every day | ORAL | 3 refills | Status: DC

## 2018-08-21 MED ORDER — MIDAZOLAM HCL 5 MG/5ML IJ SOLN
INTRAMUSCULAR | Status: AC
  Filled 2018-08-21: qty 5

## 2018-08-21 MED ORDER — FENTANYL CITRATE (PF) 100 MCG/2ML IJ SOLN
INTRAMUSCULAR | Status: AC
  Filled 2018-08-21: qty 2

## 2018-08-21 MED ORDER — HYDROMORPHONE HCL 1 MG/ML IJ SOLN
1.0000 mg | Freq: Once | INTRAMUSCULAR | Status: AC | PRN
  Administered 2018-08-21 (×2): 0.5 mg via INTRAVENOUS

## 2018-08-21 MED ORDER — CLOPIDOGREL BISULFATE 75 MG PO TABS
150.0000 mg | ORAL_TABLET | Freq: Once | ORAL | Status: AC
  Administered 2018-08-21: 150 mg via ORAL

## 2018-08-21 MED ORDER — SODIUM CHLORIDE 0.9% FLUSH: 3.0000 mL | Freq: Two times a day (BID) | INTRAVENOUS | Status: DC

## 2018-08-21 MED ORDER — FENTANYL CITRATE (PF) 100 MCG/2ML IJ SOLN
INTRAMUSCULAR | Status: DC | PRN
  Administered 2018-08-21 (×2): 25 ug via INTRAVENOUS
  Administered 2018-08-21: 50 ug via INTRAVENOUS
  Administered 2018-08-21 (×2): 25 ug via INTRAVENOUS

## 2018-08-21 MED ORDER — ASPIRIN EC 81 MG PO TBEC: 81.0000 mg | DELAYED_RELEASE_TABLET | Freq: Every day | ORAL | 3 refills | Status: DC

## 2018-08-21 MED ORDER — CLINDAMYCIN PHOSPHATE 300 MG/50ML IV SOLN
INTRAVENOUS | Status: AC
  Filled 2018-08-21: qty 50

## 2018-08-21 MED ORDER — FAMOTIDINE 20 MG PO TABS: 40.0000 mg | ORAL_TABLET | Freq: Once | ORAL | Status: DC | PRN

## 2018-08-21 MED ORDER — CLOPIDOGREL BISULFATE 75 MG PO TABS: 75.0000 mg | ORAL_TABLET | Freq: Every day | ORAL | 3 refills | Status: DC

## 2018-08-21 MED ORDER — SODIUM CHLORIDE 0.9 % IV SOLN
INTRAVENOUS | Status: DC
  Administered 2018-08-21: 07:00:00 via INTRAVENOUS

## 2018-08-21 MED ORDER — METHYLPREDNISOLONE SODIUM SUCC 125 MG IJ SOLR: 125.0000 mg | Freq: Once | INTRAMUSCULAR | Status: DC | PRN

## 2018-08-21 MED ORDER — ATORVASTATIN CALCIUM 10 MG PO TABS
10.0000 mg | ORAL_TABLET | Freq: Every day | ORAL | Status: DC
  Filled 2018-08-21: qty 1

## 2018-08-21 MED ORDER — HEPARIN SODIUM (PORCINE) 1000 UNIT/ML IJ SOLN
INTRAMUSCULAR | Status: AC
  Filled 2018-08-21: qty 1

## 2018-08-21 MED ORDER — MIDAZOLAM HCL 2 MG/2ML IJ SOLN
INTRAMUSCULAR | Status: DC | PRN
  Administered 2018-08-21 (×3): 1 mg via INTRAVENOUS
  Administered 2018-08-21: 2 mg via INTRAVENOUS

## 2018-08-21 MED ORDER — HYDROMORPHONE HCL 1 MG/ML IJ SOLN
INTRAMUSCULAR | Status: AC
  Administered 2018-08-21: 0.5 mg via INTRAVENOUS
  Filled 2018-08-21: qty 1

## 2018-08-21 MED ORDER — IOHEXOL 300 MG/ML  SOLN
INTRAMUSCULAR | Status: DC | PRN
  Administered 2018-08-21: 85 mL via INTRAVENOUS

## 2018-08-21 MED ORDER — DIPHENHYDRAMINE HCL 50 MG/ML IJ SOLN: 50.0000 mg | Freq: Once | INTRAMUSCULAR | Status: DC | PRN

## 2018-08-21 MED ORDER — SODIUM CHLORIDE 0.9 % IV SOLN: INTRAVENOUS | Status: DC

## 2018-08-21 SURGICAL SUPPLY — 18 items
BALLN LUTONIX 018 5X150X130 (BALLOONS) ×2
BALLN LUTONIX 018 5X300X130 (BALLOONS) ×2
BALLOON LUTONIX 018 5X150X130 (BALLOONS) ×1 IMPLANT
BALLOON LUTONIX 018 5X300X130 (BALLOONS) ×1 IMPLANT
CATH BEACON 5 .035 65 KMP TIP (CATHETERS) ×2 IMPLANT
CATH BEACON 5 .038 100 VERT TP (CATHETERS) ×2 IMPLANT
CATH PIG 70CM (CATHETERS) ×2 IMPLANT
DEVICE PRESTO INFLATION (MISCELLANEOUS) ×2 IMPLANT
DEVICE STARCLOSE SE CLOSURE (Vascular Products) ×2 IMPLANT
GLIDEWIRE ADV .035X260CM (WIRE) ×2 IMPLANT
PACK ANGIOGRAPHY (CUSTOM PROCEDURE TRAY) ×2 IMPLANT
SHEATH ANL2 6FRX45 HC (SHEATH) ×2 IMPLANT
SHEATH BRITE TIP 5FRX11 (SHEATH) ×2 IMPLANT
STENT VIABAHN 6X250X120 (Permanent Stent) ×4 IMPLANT
SYR MEDRAD MARK 7 150ML (SYRINGE) ×2 IMPLANT
TUBING CONTRAST HIGH PRESS 72 (TUBING) ×2 IMPLANT
WIRE G V18X300CM (WIRE) ×2 IMPLANT
WIRE J 3MM .035X145CM (WIRE) ×2 IMPLANT

## 2018-08-21 NOTE — H&P (Signed)
 [facility] History & Physical Update  The patient was interviewed and re-examined.  The patient's previous History and Physical has been reviewed and is unchanged.  There is no change in the plan of care. We plan to proceed with the scheduled procedure.  [name], MD  08/21/2018, 8:03 AM

## 2018-08-21 NOTE — Op Note (Signed)
[facility]  Percutaneous Study/Intervention Procedural Note   Date of Surgery: 08/21/2018  Surgeon(s):Hades Mathew    Assistants:none  Pre-operative Diagnosis: PAD with claudication and early rest pain right lower extremity  Post-operative diagnosis:  Same  Procedure(s) Performed:             1.  Ultrasound guidance for vascular access left femoral artery             2.  Catheter placement into right common femoral artery from left femoral approach             3.  Aortogram and selective right lower extremity angiogram             4.  Percutaneous transluminal angioplasty of the entire right SFA and above-knee popliteal artery with a 5 mm diameter by 30 cm length and 5 mm diameter by 15 cm length [name]             5.   Viabahn stent placement x2 to the right SFA and above-knee popliteal artery with two 6 mm diameter by 25 cm length stents  6.  StarClose closure device left femoral artery  EBL: 5 cc  Contrast: 85 cc  Fluoro Time: 9.8 minutes  Moderate Conscious Sedation Time: approximately 35 minutes using 5 mg of Versed and 25 Mcg of Fentanyl              Indications:  Patient is a 63 y.o.female with claudication symptoms and now developing rest pain of the right leg. The patient has noninvasive study showing reduced ABIs bilaterally and she also had a previous CT scan which showed aortic disease. The patient is brought in for angiography for further evaluation and potential treatment.  Due to the limb threatening nature of the situation, angiogram was performed for attempted limb salvage. The patient is aware that if the procedure fails, amputation would be expected.  The patient also understands that even with successful revascularization, amputation may still be required due to the severity of the situation.  Risks and benefits are discussed and informed consent is obtained.   Procedure:  The patient was identified and  appropriate procedural time out was performed.  The patient was then placed supine on the table and prepped and draped in the usual sterile fashion. Moderate conscious sedation was administered during a face to face encounter with the patient throughout the procedure with my supervision of the RN administering medicines and monitoring the patient's vital signs, pulse oximetry, telemetry and mental status throughout from the start of the procedure until the patient was taken to the recovery room. Ultrasound was used to evaluate the left common femoral artery.  It was patent but significantly diseased.  A digital ultrasound image was acquired.  A Seldinger needle was used to access the left common femoral artery under direct ultrasound guidance and a permanent image was performed.  A 0.035 J wire was advanced without resistance and a 5Fr sheath was placed.  Pigtail catheter was placed into the aorta and an AP aortogram was performed. This demonstrated normal renal arteries and a very irregular aorta with at least a 60 to 70% stenosis in the mid infrarenal aorta.  Both iliac arteries had mild disease of less than 50% but this did not appear to be hemodynamically significant even with pelvic oblique shots. I then crossed the aortic bifurcation and advanced to the right femoral head in the right common femoral artery. Selective right lower extremity angiogram  was then performed. This demonstrated a flush occlusion of the right SFA with what appeared to be a pretty good common femoral and profunda femoris artery.  There was reconstitution of the above-knee popliteal artery which normalized at and just below the level of the knee.  There was then two-vessel runoff through the peroneal and posterior tibial arteries distally. It was felt that it was in the patient's best interest to proceed with intervention after these images to avoid a second procedure and a larger amount of contrast and fluoroscopy based off of the  findings from the initial angiogram. The patient was systemically heparinized and a 6 [place] Ansell sheath was then placed over the [name]. I then used a Kumpe catheter and the advantage wire to easily navigate through the SFA and popliteal lesions and confirm intraluminal flow in the mid popliteal artery.  I then exchanged for a 0.018 wire and proceeded with treatment.  A 5 mm diameter by 30 cm length [name] was inflated from the common femoral artery down to Hunter's canal.  This was taken to 8 atm for 1 minute.  A 5 mm diameter by 15 cm length [name] was then inflated from just above the knee up to Hunter's canal to treat the above-knee popliteal artery and distal SFA.  This was also taken to 8 atm for 1 minute.  Completion imaging showed diffuse residual disease with what appeared to be some thrombus and multiple areas of greater than 50% stenosis.  I elected to place a covered stents to treat these areas.  This required two 6 mm diameter by 25 cm length Viabahn stents from the proximal SFA down to just above the knee in the above-knee popliteal artery.  These were postdilated with 5 mm balloons with excellent angiographic completion result and less than 10% residual stenosis. I elected to terminate the procedure. The sheath was removed and StarClose closure device was deployed in the left femoral artery with excellent hemostatic result. The patient was taken to the recovery room in stable condition having tolerated the procedure well.  Findings:               Aortogram:  This demonstrated normal renal arteries and a very irregular aorta with at least a 60 to 70% stenosis in the mid infrarenal aorta.  Both iliac arteries had mild disease of less than 50% but this did not appear to be hemodynamically significant even with pelvic oblique shots             Right lower Extremity:  There was a flush occlusion of the right SFA with  what appeared to be a pretty good common femoral and profunda femoris artery.  There was reconstitution of the above-knee popliteal artery which normalized at and just below the level of the knee.  There was then two-vessel runoff through the peroneal and posterior tibial arteries distally.   Disposition: Patient was taken to the recovery room in stable condition having tolerated the procedure well.  Complications: None  Leotis Pain 08/21/2018 9:19 AM   This note was created with Dragon Medical transcription system. Any errors in dictation are purely unintentional.

## 2018-08-24 ENCOUNTER — Telehealth (INDEPENDENT_AMBULATORY_CARE_PROVIDER_SITE_OTHER): Payer: Self-pay

## 2018-08-24 ENCOUNTER — Other Ambulatory Visit (INDEPENDENT_AMBULATORY_CARE_PROVIDER_SITE_OTHER): Payer: Self-pay | Admitting: Vascular Surgery

## 2018-08-24 NOTE — H&P (Signed)
[facility] Admission History & Physical  MRN : [id number]  Sylvia [name] is a 63 y.o. (11/13/1955) female who presents with chief complaint of No chief complaint on file. Marland Kitchen.  History of Present Illness: Patient presents today for treatment of peripheral arterial disease.  She was seen in hospital consultation 2 to 3 months ago and then back in the clinic about 6 weeks ago.  She has severe claudication symptoms bilaterally worse on the right than the left and has started developing some pain in her feet at night on the right worrisome for rest pain.  She has some known aortoiliac disease from previous CT scan and reduced ABIs which were significant bilaterally.  No ulceration or infection.  No fevers or chills.  Does have ongoing tobacco use and other atherosclerotic risk factors as listed below  No current facility-administered medications for this encounter.    Current Outpatient Medications  Medication Sig Dispense Refill  . Biotin 1 MG CAPS Take 1 tablet by mouth daily.    . cetirizine (ZYRTEC) 10 MG tablet Take 10 mg by mouth daily.    . cholecalciferol (VITAMIN D3) 25 MCG (1000 UT) tablet Take 1,000 Units by mouth daily.    . Multiple Vitamins-Minerals (WOMENS MULTIVITAMIN PO) Take 1 tablet by mouth daily.    . ranitidine (ZANTAC) 150 MG tablet Take 150 mg by mouth every morning.     . sitaGLIPtin (JANUVIA) 100 MG tablet Take 100 mg by mouth daily.    Marland Kitchen. albuterol (PROVENTIL HFA;VENTOLIN HFA) 108 (90 Base) MCG/ACT inhaler Inhale 2 puffs into the lungs every 6 (six) hours as needed for wheezing or shortness of breath. 1 Inhaler 2  . alum & mag hydroxide-simeth (MAALOX/MYLANTA) 200-200-20 MG/5ML suspension Take 30 mLs by mouth every 4 (four) hours as needed for indigestion or heartburn. (Patient not taking: Reported on 08/21/2018) 355 mL 0  . aspirin EC 81 MG tablet Take 1 tablet (81 mg total) by mouth daily. 90 tablet 3  . atorvastatin (LIPITOR) 10 MG tablet  Take 1 tablet (10 mg total) by mouth daily. 90 tablet 3  . clopidogrel (PLAVIX) 75 MG tablet Take 1 tablet (75 mg total) by mouth daily. 90 tablet 3  . ondansetron (ZOFRAN) 4 MG tablet Take 1 tablet (4 mg total) by mouth every 6 (six) hours as needed for nausea. (Patient not taking: Reported on 08/18/2018) 20 tablet 0  . pantoprazole (PROTONIX) 40 MG tablet Take 1 tablet (40 mg total) by mouth daily. (Patient not taking: Reported on 08/21/2018) 30 tablet 0  . vitamin C (ASCORBIC ACID) 500 MG tablet Take 500 mg by mouth daily.      Past Medical History:  Diagnosis Date  . Anxiety    h/o  . COPD (chronic obstructive pulmonary disease) (HCC)   . Diabetes mellitus without complication (HCC)   . GERD (gastroesophageal reflux disease)   . Hypertension    bp under control-off meds since 2019    Past Surgical History:  Procedure Laterality Date  . ABDOMINAL HYSTERECTOMY    . CHOLECYSTECTOMY    . ESOPHAGOGASTRODUODENOSCOPY (EGD) WITH PROPOFOL N/A 06/15/2018   Procedure: ESOPHAGOGASTRODUODENOSCOPY (EGD) WITH PROPOFOL;  Surgeon: Toledo, Boykin Nearingeodoro K, MD;  Location: [facility] ENDOSCOPY;  Service: Gastroenterology;  Laterality: N/A;  . LOWER EXTREMITY ANGIOGRAPHY Right 08/21/2018   Procedure: LOWER EXTREMITY ANGIOGRAPHY;  Surgeon: Annice Needyew, Narely Nobles S, MD;  Location: [facility] INVASIVE CV LAB;  Service: Cardiovascular;  Laterality: Right;    Social History Social History  Tobacco Use  . Smoking status: Current Every Day Smoker    Packs/day: 0.25    Years: 45.00    Pack years: 11.25    Types: Cigarettes  . Smokeless tobacco: Never Used  . Tobacco comment: down to 1 cig daily  Substance Use Topics  . Alcohol use: No    Frequency: Never  . Drug use: Never    Family History Family History  Problem Relation Age of Onset  . Breast cancer Mother 85  no bleeding disorders, clotting disorders, autoimmune diseases, or aneurysms  Allergies  Allergen Reactions  . Penicillins Hives    Has patient had a PCN reaction  causing immediate rash, facial/tongue/throat swelling, SOB or lightheadedness with hypotension: Yes Has patient had a PCN reaction causing severe rash involving mucus membranes or skin necrosis: No Has patient had a PCN reaction that required hospitalization: No Has patient had a PCN reaction occurring within the last 10 years: No If all of the above answers are "NO", then may proceed with Cephalosporin use.     REVIEW OF SYSTEMS (Negative unless checked)  Constitutional: [] Weight loss  [] Fever  [] Chills Cardiac: [] Chest pain   [] Chest pressure   [] Palpitations   [] Shortness of breath when laying flat   [] Shortness of breath at rest   [] Shortness of breath with exertion. Vascular:  [x] Pain in legs with walking   [] Pain in legs at rest   [] Pain in legs when laying flat   [x] Claudication   [] Pain in feet when walking  [x] Pain in feet at rest  [] Pain in feet when laying flat   [] History of DVT   [] Phlebitis   [] Swelling in legs   [] Varicose veins   [] Non-healing ulcers Pulmonary:   [] Uses home oxygen   [] Productive cough   [] Hemoptysis   [] Wheeze  [x] COPD   [] Asthma Neurologic:  [] Dizziness  [] Blackouts   [] Seizures   [] History of stroke   [] History of TIA  [] Aphasia   [] Temporary blindness   [] Dysphagia   [] Weakness or numbness in arms   [] Weakness or numbness in legs Musculoskeletal:  [] Arthritis   [] Joint swelling   [] Joint pain   [] Low back pain Hematologic:  [] Easy bruising  [] Easy bleeding   [] Hypercoagulable state   [] Anemic  [] Hepatitis Gastrointestinal:  [] Blood in stool   [] Vomiting blood  [] Gastroesophageal reflux/heartburn   [] Difficulty swallowing. Positive for abdominal pain Genitourinary:  [] Chronic kidney disease   [] Difficult urination  [] Frequent urination  [] Burning with urination   [] Blood in urine Skin:  [] Rashes   [] Ulcers   [] Wounds Psychological:  [x] History of anxiety   [x]  History of major depression.  Physical Examination  Vitals:   08/21/18 1055 08/21/18 1100  08/21/18 1101 08/21/18 1115  BP: (!) 161/145 (!) 164/97 (!) 167/90 (!) 156/89  Pulse:  68 64 75  Resp: 16 18 16 17   Temp:      TempSrc:      SpO2:  96% 97% 100%  Weight:      Height:       Body mass index is 22.71 kg/m. Gen: WD/WN, NAD Head: [place]/AT, No temporalis wasting.  Ear/Nose/Throat: Hearing grossly intact, nares w/o erythema or drainage, oropharynx w/o Erythema/Exudate,  Eyes: Conjunctiva clear, sclera non-icteric Neck: Trachea midline.  No JVD.  Pulmonary:  Good air movement, respirations not labored, no use of accessory muscles.  Cardiac: RRR, normal S1, S2. Vascular:  Vessel Right Left  Radial Palpable Palpable  PT 1+ Palpable Not Palpable  DP Trace Palpable 1+ Palpable    Musculoskeletal: M/S 5/5 throughout.  Extremities without ischemic changes.  No deformity or atrophy.  Neurologic: Sensation grossly intact in extremities.  Symmetrical.  Speech is fluent. Motor exam as listed above. Psychiatric: Judgment intact, Mood & affect appropriate for pt's clinical situation. Dermatologic: No rashes or ulcers noted.  No cellulitis or open wounds.      CBC Lab Results  Component Value Date   WBC 9.4 06/15/2018   HGB 10.6 (L) 06/15/2018   HCT 30.9 (L) 06/15/2018   MCV 84.0 06/15/2018   PLT 257 06/15/2018    BMET    Component Value Date/Time   NA 141 06/16/2018 0435   K 3.3 (L) 06/16/2018 0435   CL 114 (H) 06/16/2018 0435   CO2 23 06/16/2018 0435   GLUCOSE 162 (H) 06/16/2018 0435   BUN 13 08/18/2018 1000   CREATININE 0.76 08/18/2018 1000   CALCIUM 8.4 (L) 06/16/2018 0435   GFRNONAA >60 08/18/2018 1000   GFRAA >60 08/18/2018 1000   Estimated Creatinine Clearance: 70 mL/min (by C-G formula based on SCr of 0.76 mg/dL).  COAG No results found for: INR, PROTIME  Radiology No results found.  Assessment/Plan 1.  PAD with claudication and early rest pain symptoms on the right leg.  Multilevel disease as expected and the ABIs are  significantly reduced.  Risks and benefits of angiography with possible intervention of the right lower extremity were discussed and she is agreeable to proceed. 2. COPD. Continue pulmonary medications and aerosols as already ordered, these medications have been reviewed and there are no changes at this time. 3. DM. Stable on outpatient medications and blood glucose control important in reducing the progression of atherosclerotic disease. Also, involved in wound healing. On appropriate medications. 4. Tobacco use. A cause of her PAD and cessation would be of benefit.   Festus BarrenJason Marcelo Ickes, MD  08/24/2018 2:39 PM

## 2018-08-24 NOTE — Telephone Encounter (Signed)
The patient was called back to ask the question of the fever temperature. The patient stated that her temperature that was taken a few minutes ago was 104 degrees, patient also stated she was not going to the hospital.

## 2018-08-24 NOTE — Telephone Encounter (Signed)
Patient called in stating that she is in a lot of pain from her right leg angio with Dr. Wyn Quaker on 08/21/2018. The patient states that she has some swelling and has being running a fever as well.

## 2018-08-24 NOTE — Telephone Encounter (Signed)
Spoke with the patient and let her know Ruben Reason recommendations for the patient. Patient is to elevate her leg for the swelling and to alternate Tylenol and Ibuprofen for the fever. A prescription for Tramadol 50 mg #20 no refills take one by mouth q6 hours as needed for pain was called into the patient's pharmacy at [facility]. Per the patient she was called from the hospital to come in and be tested for Covid-19, so the patient is going to get tested.

## 2018-08-25 ENCOUNTER — Ambulatory Visit
Admission: RE | Admit: 2018-08-25 | Discharge: 2018-08-25 | Disposition: A | Discharge status: Home / Self Care | Payer: Self-pay | Source: Ambulatory Visit | Attending: Vascular Surgery | Admitting: Vascular Surgery

## 2018-08-25 ENCOUNTER — Other Ambulatory Visit: Payer: Self-pay

## 2018-08-25 ENCOUNTER — Other Ambulatory Visit (INDEPENDENT_AMBULATORY_CARE_PROVIDER_SITE_OTHER): Payer: Self-pay | Admitting: Nurse Practitioner

## 2018-08-25 DIAGNOSIS — Z01812 Encounter for preprocedural laboratory examination: Secondary | ICD-10-CM | POA: Insufficient documentation

## 2018-08-25 DIAGNOSIS — Z1159 Encounter for screening for other viral diseases: Secondary | ICD-10-CM | POA: Insufficient documentation

## 2018-08-25 LAB — CREATININE, SERUM
Creatinine, Ser: 0.81 mg/dL (ref 0.44–1.00)
GFR calc Af Amer: >60 mL/min (ref >60)
GFR calc non Af Amer: >60 mL/min (ref >60)

## 2018-08-25 LAB — BUN: BUN: 14 mg/dL (ref 8–23)

## 2018-08-26 LAB — NOVEL CORONAVIRUS, NAA (HOSP ORDER, SEND-OUT TO REF LAB; TAT 18-24 HRS): SARS-CoV-2, NAA: NOT DETECTED

## 2018-08-28 ENCOUNTER — Inpatient Hospital Stay
Admission: RE | Admit: 2018-08-28 | Discharge: 2018-08-30 | DRG: 254 | Disposition: A | Discharge status: Home / Self Care | Payer: Medicaid Other | Attending: Vascular Surgery | Admitting: Vascular Surgery

## 2018-08-28 ENCOUNTER — Encounter
Admission: RE | Disposition: A | Discharge status: Home / Self Care | Payer: Self-pay | Source: Home / Self Care | Attending: Vascular Surgery

## 2018-08-28 ENCOUNTER — Other Ambulatory Visit: Payer: Self-pay

## 2018-08-28 DIAGNOSIS — Z1159 Encounter for screening for other viral diseases: Secondary | ICD-10-CM

## 2018-08-28 DIAGNOSIS — E1151 Type 2 diabetes mellitus with diabetic peripheral angiopathy without gangrene: Secondary | ICD-10-CM | POA: Diagnosis present

## 2018-08-28 DIAGNOSIS — Z79899 Other long term (current) drug therapy: Secondary | ICD-10-CM

## 2018-08-28 DIAGNOSIS — I70219 Atherosclerosis of native arteries of extremities with intermittent claudication, unspecified extremity: Secondary | ICD-10-CM

## 2018-08-28 DIAGNOSIS — M79671 Pain in right foot: Secondary | ICD-10-CM | POA: Diagnosis present

## 2018-08-28 DIAGNOSIS — E876 Hypokalemia: Secondary | ICD-10-CM | POA: Diagnosis present

## 2018-08-28 DIAGNOSIS — I10 Essential (primary) hypertension: Secondary | ICD-10-CM | POA: Diagnosis present

## 2018-08-28 DIAGNOSIS — I70221 Atherosclerosis of native arteries of extremities with rest pain, right leg: Principal | ICD-10-CM | POA: Diagnosis present

## 2018-08-28 DIAGNOSIS — I70229 Atherosclerosis of native arteries of extremities with rest pain, unspecified extremity: Secondary | ICD-10-CM | POA: Diagnosis present

## 2018-08-28 DIAGNOSIS — J449 Chronic obstructive pulmonary disease, unspecified: Secondary | ICD-10-CM | POA: Diagnosis present

## 2018-08-28 DIAGNOSIS — T82856A Stenosis of peripheral vascular stent, initial encounter: Secondary | ICD-10-CM

## 2018-08-28 DIAGNOSIS — I70212 Atherosclerosis of native arteries of extremities with intermittent claudication, left leg: Secondary | ICD-10-CM

## 2018-08-28 DIAGNOSIS — F419 Anxiety disorder, unspecified: Secondary | ICD-10-CM | POA: Diagnosis present

## 2018-08-28 DIAGNOSIS — K219 Gastro-esophageal reflux disease without esophagitis: Secondary | ICD-10-CM | POA: Diagnosis present

## 2018-08-28 HISTORY — PX: LOWER EXTREMITY ANGIOGRAPHY: CATH118251

## 2018-08-28 LAB — GLUCOSE, CAPILLARY
Glucose-Capillary: 184 mg/dL — ABNORMAL HIGH (ref 70–99)
Glucose-Capillary: 157 mg/dL — ABNORMAL HIGH (ref 70–99)

## 2018-08-28 SURGERY — LOWER EXTREMITY ANGIOGRAPHY
Anesthesia: Moderate Sedation | Laterality: Right

## 2018-08-28 SURGERY — LOWER EXTREMITY ANGIOGRAPHY
Anesthesia: Moderate Sedation | Site: Leg Lower | Laterality: Left

## 2018-08-28 MED ORDER — HYDROMORPHONE HCL 1 MG/ML IJ SOLN
1.0000 mg | Freq: Once | INTRAMUSCULAR | Status: AC | PRN
  Administered 2018-08-28: 1 mg via INTRAVENOUS
  Filled 2018-08-28: qty 1

## 2018-08-28 MED ORDER — ACETAMINOPHEN 650 MG RE SUPP: 650.0000 mg | Freq: Four times a day (QID) | RECTAL | Status: DC | PRN

## 2018-08-28 MED ORDER — NITROGLYCERIN 5 MG/ML IV SOLN
INTRAVENOUS | Status: AC
  Filled 2018-08-28: qty 10

## 2018-08-28 MED ORDER — LINAGLIPTIN 5 MG PO TABS
5.0000 mg | ORAL_TABLET | Freq: Every day | ORAL | Status: DC
  Administered 2018-08-28 – 2018-08-30 (×3): 5 mg via ORAL
  Filled 2018-08-28 (×3): qty 1

## 2018-08-28 MED ORDER — CLINDAMYCIN PHOSPHATE 300 MG/50ML IV SOLN
INTRAVENOUS | Status: AC
  Filled 2018-08-28: qty 50

## 2018-08-28 MED ORDER — NITROGLYCERIN 1 MG/10 ML FOR IR/CATH LAB
INTRA_ARTERIAL | Status: DC | PRN
  Administered 2018-08-28: 250 ug

## 2018-08-28 MED ORDER — MIDAZOLAM HCL 2 MG/2ML IJ SOLN
INTRAMUSCULAR | Status: DC | PRN
  Administered 2018-08-28: 2 mg via INTRAVENOUS

## 2018-08-28 MED ORDER — VITAMIN C 500 MG PO TABS
500.0000 mg | ORAL_TABLET | Freq: Every day | ORAL | Status: DC
  Administered 2018-08-28 – 2018-08-30 (×3): 500 mg via ORAL
  Filled 2018-08-28 (×3): qty 1

## 2018-08-28 MED ORDER — FAMOTIDINE 20 MG PO TABS
20.0000 mg | ORAL_TABLET | Freq: Every day | ORAL | Status: DC
  Administered 2018-08-28 – 2018-08-30 (×3): 20 mg via ORAL
  Filled 2018-08-28 (×3): qty 1

## 2018-08-28 MED ORDER — TIROFIBAN (AGGRASTAT) BOLUS VIA INFUSION
25.0000 ug/kg | Freq: Once | INTRAVENOUS | Status: AC
  Administered 2018-08-28: 12500 ug via INTRAVENOUS

## 2018-08-28 MED ORDER — ALBUTEROL SULFATE (2.5 MG/3ML) 0.083% IN NEBU: 2.5000 mg | INHALATION_SOLUTION | Freq: Four times a day (QID) | RESPIRATORY_TRACT | Status: DC | PRN

## 2018-08-28 MED ORDER — LIDOCAINE-EPINEPHRINE (PF) 1 %-1:200000 IJ SOLN
INTRAMUSCULAR | Status: AC
  Filled 2018-08-28: qty 30

## 2018-08-28 MED ORDER — MIDAZOLAM HCL 2 MG/2ML IJ SOLN
1.0000 mg | Freq: Once | INTRAMUSCULAR | Status: AC
  Administered 2018-08-28 (×2): 0.5 mg via INTRAVENOUS

## 2018-08-28 MED ORDER — FENTANYL CITRATE (PF) 100 MCG/2ML IJ SOLN
150.0000 ug | INTRAMUSCULAR | Status: DC | PRN
  Administered 2018-08-28: 50 ug via INTRAVENOUS
  Administered 2018-08-28 (×2): 25 ug via INTRAVENOUS
  Administered 2018-08-28: 50 ug via INTRAVENOUS

## 2018-08-28 MED ORDER — SODIUM CHLORIDE (PF) 0.9 % IJ SOLN
INTRAMUSCULAR | Status: AC
  Filled 2018-08-28: qty 50

## 2018-08-28 MED ORDER — TIROFIBAN HCL IV 12.5 MG/250 ML
0.1500 ug/kg/min | INTRAVENOUS | Status: AC
  Administered 2018-08-28: 0.15 ug/kg/min via INTRAVENOUS

## 2018-08-28 MED ORDER — FENTANYL CITRATE (PF) 100 MCG/2ML IJ SOLN
INTRAMUSCULAR | Status: AC
  Filled 2018-08-28: qty 2

## 2018-08-28 MED ORDER — LORATADINE 10 MG PO TABS
10.0000 mg | ORAL_TABLET | Freq: Every day | ORAL | Status: DC
  Administered 2018-08-28 – 2018-08-30 (×3): 10 mg via ORAL
  Filled 2018-08-28 (×3): qty 1

## 2018-08-28 MED ORDER — ASPIRIN EC 81 MG PO TBEC: 81.0000 mg | DELAYED_RELEASE_TABLET | Freq: Every day | ORAL | Status: DC

## 2018-08-28 MED ORDER — IOHEXOL 300 MG/ML  SOLN
INTRAMUSCULAR | Status: DC | PRN
  Administered 2018-08-28: 140 mL via INTRA_ARTERIAL

## 2018-08-28 MED ORDER — DEXTROSE-NACL 5-0.9 % IV SOLN
INTRAVENOUS | Status: DC
  Administered 2018-08-28 – 2018-08-30 (×4): via INTRAVENOUS

## 2018-08-28 MED ORDER — FENTANYL CITRATE (PF) 100 MCG/2ML IJ SOLN
INTRAMUSCULAR | Status: DC | PRN
  Administered 2018-08-28: 50 ug via INTRAVENOUS

## 2018-08-28 MED ORDER — BIOTIN 1 MG PO CAPS: 1.0000 | ORAL_CAPSULE | Freq: Every day | ORAL | Status: DC

## 2018-08-28 MED ORDER — DOCUSATE SODIUM 100 MG PO CAPS
100.0000 mg | ORAL_CAPSULE | Freq: Two times a day (BID) | ORAL | Status: DC
  Administered 2018-08-28 – 2018-08-30 (×4): 100 mg via ORAL
  Filled 2018-08-28 (×4): qty 1

## 2018-08-28 MED ORDER — HEPARIN SODIUM (PORCINE) 1000 UNIT/ML IJ SOLN
INTRAMUSCULAR | Status: DC | PRN
  Administered 2018-08-28: 5000 [IU] via INTRAVENOUS

## 2018-08-28 MED ORDER — ONDANSETRON HCL 4 MG/2ML IJ SOLN: 4.0000 mg | Freq: Four times a day (QID) | INTRAMUSCULAR | Status: DC | PRN

## 2018-08-28 MED ORDER — ONDANSETRON HCL 4 MG PO TABS: 4.0000 mg | ORAL_TABLET | Freq: Four times a day (QID) | ORAL | Status: DC | PRN

## 2018-08-28 MED ORDER — ALTEPLASE 2 MG IJ SOLR
INTRAMUSCULAR | Status: DC | PRN
  Administered 2018-08-28: 6 mg

## 2018-08-28 MED ORDER — ASPIRIN EC 81 MG PO TBEC
81.0000 mg | DELAYED_RELEASE_TABLET | Freq: Every day | ORAL | Status: DC
  Administered 2018-08-29 – 2018-08-30 (×2): 81 mg via ORAL
  Filled 2018-08-28 (×3): qty 1

## 2018-08-28 MED ORDER — ALUM & MAG HYDROXIDE-SIMETH 200-200-20 MG/5ML PO SUSP: 30.0000 mL | ORAL | Status: DC | PRN

## 2018-08-28 MED ORDER — HYDROMORPHONE HCL 1 MG/ML IJ SOLN
1.0000 mg | INTRAMUSCULAR | Status: DC | PRN
  Administered 2018-08-28: 1 mg via INTRAVENOUS

## 2018-08-28 MED ORDER — CEFAZOLIN SODIUM-DEXTROSE 2-4 GM/100ML-% IV SOLN
INTRAVENOUS | Status: AC
  Filled 2018-08-28: qty 100

## 2018-08-28 MED ORDER — MIDAZOLAM HCL 2 MG/2ML IJ SOLN
INTRAMUSCULAR | Status: DC | PRN
  Administered 2018-08-28 (×3): 1 mg via INTRAVENOUS
  Administered 2018-08-28: 2 mg via INTRAVENOUS

## 2018-08-28 MED ORDER — SODIUM CHLORIDE 0.9 % IV SOLN
INTRAVENOUS | Status: DC
  Administered 2018-08-28: 07:00:00 via INTRAVENOUS

## 2018-08-28 MED ORDER — ACETAMINOPHEN 325 MG PO TABS: 650.0000 mg | ORAL_TABLET | Freq: Four times a day (QID) | ORAL | Status: DC | PRN

## 2018-08-28 MED ORDER — SODIUM CHLORIDE 0.9 % IV SOLN
1.0000 mg/h | INTRAVENOUS | Status: DC
  Administered 2018-08-28: 1 mg/h
  Filled 2018-08-28 (×2): qty 10

## 2018-08-28 MED ORDER — MIDAZOLAM HCL 2 MG/2ML IJ SOLN
1.0000 mg | Freq: Once | INTRAMUSCULAR | Status: AC
  Administered 2018-08-28 (×2): 0.5 mg via INTRAVENOUS

## 2018-08-28 MED ORDER — TIROFIBAN HCL IV 12.5 MG/250 ML
INTRAVENOUS | Status: AC
  Administered 2018-08-28: 12500 ug
  Filled 2018-08-28: qty 250

## 2018-08-28 MED ORDER — MIDAZOLAM HCL 5 MG/5ML IJ SOLN
INTRAMUSCULAR | Status: AC
  Administered 2018-08-28: 0.5 mg via INTRAVENOUS
  Filled 2018-08-28: qty 5

## 2018-08-28 MED ORDER — APIXABAN 5 MG PO TABS
5.0000 mg | ORAL_TABLET | Freq: Two times a day (BID) | ORAL | Status: DC
  Administered 2018-08-29 – 2018-08-30 (×3): 5 mg via ORAL
  Filled 2018-08-28 (×3): qty 1

## 2018-08-28 MED ORDER — HEPARIN BOLUS VIA INFUSION
600.0000 [IU] | Freq: Once | INTRAVENOUS | Status: DC
  Filled 2018-08-28: qty 600

## 2018-08-28 MED ORDER — HYDROCODONE-ACETAMINOPHEN 5-325 MG PO TABS
1.0000 | ORAL_TABLET | ORAL | Status: DC | PRN
  Administered 2018-08-29 – 2018-08-30 (×6): 2 via ORAL
  Filled 2018-08-28 (×6): qty 2

## 2018-08-28 MED ORDER — HYDROMORPHONE HCL 1 MG/ML IJ SOLN
1.0000 mg | INTRAMUSCULAR | Status: DC | PRN
  Administered 2018-08-28: 1 mg via INTRAVENOUS
  Administered 2018-08-28 (×2): 0.5 mg via INTRAVENOUS

## 2018-08-28 MED ORDER — CLINDAMYCIN PHOSPHATE 300 MG/50ML IV SOLN
300.0000 mg | Freq: Once | INTRAVENOUS | Status: AC
  Administered 2018-08-28: 300 mg via INTRAVENOUS

## 2018-08-28 MED ORDER — HEPARIN SODIUM (PORCINE) 1000 UNIT/ML IJ SOLN
INTRAMUSCULAR | Status: AC
  Filled 2018-08-28: qty 1

## 2018-08-28 MED ORDER — FLEET ENEMA 7-19 GM/118ML RE ENEM: 1.0000 | ENEMA | Freq: Once | RECTAL | Status: DC | PRN

## 2018-08-28 MED ORDER — ATORVASTATIN CALCIUM 10 MG PO TABS
10.0000 mg | ORAL_TABLET | Freq: Every day | ORAL | Status: DC
  Administered 2018-08-28 – 2018-08-30 (×3): 10 mg via ORAL
  Filled 2018-08-28 (×3): qty 1

## 2018-08-28 MED ORDER — HEPARIN SODIUM (PORCINE) 1000 UNIT/ML IJ SOLN
INTRAMUSCULAR | Status: DC | PRN
  Administered 2018-08-28: 3000 [IU] via INTRAVENOUS

## 2018-08-28 MED ORDER — FENTANYL CITRATE (PF) 100 MCG/2ML IJ SOLN
INTRAMUSCULAR | Status: DC | PRN
  Administered 2018-08-28: 50 ug via INTRAVENOUS
  Administered 2018-08-28: 25 ug via INTRAVENOUS
  Administered 2018-08-28: 50 ug via INTRAVENOUS
  Administered 2018-08-28: 25 ug via INTRAVENOUS

## 2018-08-28 MED ORDER — ADULT MULTIVITAMIN W/MINERALS CH
1.0000 | ORAL_TABLET | Freq: Every day | ORAL | Status: DC
  Administered 2018-08-28 – 2018-08-30 (×3): 1 via ORAL
  Filled 2018-08-28 (×3): qty 1

## 2018-08-28 MED ORDER — SORBITOL 70 % SOLN: 30.0000 mL | Freq: Every day | Status: DC | PRN

## 2018-08-28 MED ORDER — ALTEPLASE 2 MG IJ SOLR
INTRAMUSCULAR | Status: AC
  Filled 2018-08-28: qty 6

## 2018-08-28 MED ORDER — MAGNESIUM HYDROXIDE 400 MG/5ML PO SUSP
30.0000 mL | Freq: Every day | ORAL | Status: DC | PRN
  Administered 2018-08-30: 30 mL via ORAL
  Filled 2018-08-28: qty 30

## 2018-08-28 MED ORDER — PANTOPRAZOLE SODIUM 40 MG PO TBEC
40.0000 mg | DELAYED_RELEASE_TABLET | Freq: Every day | ORAL | Status: DC
  Administered 2018-08-29 – 2018-08-30 (×2): 40 mg via ORAL
  Filled 2018-08-28 (×3): qty 1

## 2018-08-28 MED ORDER — VITAMIN D3 25 MCG (1000 UNIT) PO TABS
1000.0000 [IU] | ORAL_TABLET | Freq: Every day | ORAL | Status: DC
  Administered 2018-08-28 – 2018-08-30 (×3): 1000 [IU] via ORAL
  Filled 2018-08-28 (×3): qty 1

## 2018-08-28 MED ORDER — MIDAZOLAM HCL 5 MG/5ML IJ SOLN
INTRAMUSCULAR | Status: AC
  Filled 2018-08-28: qty 5

## 2018-08-28 SURGICAL SUPPLY — 26 items
BALLN LUTONIX 4X220X130 (BALLOONS) ×2
BALLN LUTONIX DCB 6X60X130 (BALLOONS) ×2
BALLN ULTRVRSE 3X80X150 (BALLOONS) ×2
BALLN ULTRVRSE 3X80X150 OTW (BALLOONS) ×1
BALLOON LUTONIX 4X220X130 (BALLOONS) ×1 IMPLANT
BALLOON LUTONIX DCB 6X60X130 (BALLOONS) ×1 IMPLANT
BALLOON ULTRVRSE 3X80X150 OTW (BALLOONS) ×1 IMPLANT
CANISTER PENUMBRA ENGINE (MISCELLANEOUS) ×2 IMPLANT
CATH BEACON 5 .038 100 VERT TP (CATHETERS) ×2 IMPLANT
CATH INFUS 90CMX50CM (CATHETERS) ×1
CATH INFUS UNIFUSE 90X50 5FR (CATHETERS) ×1 IMPLANT
CATH PIG 70CM (CATHETERS) ×2 IMPLANT
DEVICE PRESTO INFLATION (MISCELLANEOUS) ×2 IMPLANT
DEVICE STARCLOSE SE CLOSURE (Vascular Products) ×2 IMPLANT
GLIDEWIRE ADV .035X260CM (WIRE) ×2 IMPLANT
PACK ANGIOGRAPHY (CUSTOM PROCEDURE TRAY) ×2 IMPLANT
SHEATH ANL2 6FRX45 HC (SHEATH) ×2 IMPLANT
SHEATH BRITE TIP 5FRX11 (SHEATH) ×2 IMPLANT
SHEATH BRITE TIP 6FR X 23 (SHEATH) ×2 IMPLANT
STENT LIFESTAR 7X60X80 (Permanent Stent) ×2 IMPLANT
STENT VIABAHN 6X150X120 (Permanent Stent) ×2 IMPLANT
SUT PROLENE 0 CT 1 30 (SUTURE) ×2 IMPLANT
SYR MEDRAD MARK 7 150ML (SYRINGE) ×2 IMPLANT
TUBING CONTRAST HIGH PRESS 72 (TUBING) ×2 IMPLANT
WIRE G V18X300CM (WIRE) ×2 IMPLANT
WIRE J 3MM .035X145CM (WIRE) ×2 IMPLANT

## 2018-08-28 SURGICAL SUPPLY — 18 items
BALLN LUTONIX 018 4X150X130 (BALLOONS) ×2
BALLN ULTRVRSE 018 2.5X150X150 (BALLOONS) ×2
BALLN ULTRVRSE 3.5X100X150 (BALLOONS) ×2
BALLOON LUTONIX 018 4X150X130 (BALLOONS) ×1 IMPLANT
BALLOON ULTRVRSE 3.5X100X150 (BALLOONS) ×1 IMPLANT
BALLOON ULTRVS 018 2.5X150X150 (BALLOONS) ×1 IMPLANT
CANISTER PENUMBRA ENGINE (MISCELLANEOUS) ×2 IMPLANT
CATH BEACON 5 .038 100 VERT TP (CATHETERS) ×2 IMPLANT
CATH INDIGO CAT6 KIT (CATHETERS) ×2 IMPLANT
DEVICE PRESTO INFLATION (MISCELLANEOUS) ×2 IMPLANT
DEVICE SAFEGUARD 24CM (GAUZE/BANDAGES/DRESSINGS) ×4 IMPLANT
DEVICE STARCLOSE SE CLOSURE (Vascular Products) ×2 IMPLANT
PACK ANGIOGRAPHY (CUSTOM PROCEDURE TRAY) ×2 IMPLANT
SHEATH BRITE TIP 6FR X 23 (SHEATH) ×2 IMPLANT
VALVE HEMO TOUHY BORST Y (ADAPTER) ×2 IMPLANT
WIRE G V18X300CM (WIRE) ×2 IMPLANT
WIRE J 3MM .035X145CM (WIRE) ×2 IMPLANT
WIRE MAGIC TORQUE 260C (WIRE) ×2 IMPLANT

## 2018-08-28 NOTE — Op Note (Signed)
[facility]  Percutaneous Study/Intervention Procedural Note   Date of Surgery: 08/28/2018  Surgeon(s):Reagen Haberman    Assistants:none  Pre-operative Diagnosis: PAD with claudication LLE, rest pain right leg  Post-operative diagnosis:  Same  Procedure(s) Performed:             1.  Ultrasound guidance for vascular access right femoral artery             2.  Catheter placement into left common femoral artery from right femoral approach             3.  Aortogram and selective left lower extremity angiogram             4.  Percutaneous transluminal angioplasty of left SFA and proximal popliteal artery with 4 mm diameter by 22 cm length [name]             5.   Percutaneous transluminal angioplasty of the left tibioperoneal trunk with 3 mm diameter by 8 cm length angioplasty balloon  6.  Viabahn stent placement to the left distal SFA and proximal popliteal artery with 6 mm diameter by 15 cm length stent for greater than 50% residual stenosis after angioplasty  7.  Percutaneous transluminal angioplasty of the left external iliac artery with 6 mm diameter by 6 cm length [name]  8.  [name] to the left external iliac artery with 7 mm diameter by 6 cm length stent             9.  StarClose closure device right femoral artery  10.  Ultrasound guidance for vascular access left femoral artery  11.  Catheter placement to the right popliteal artery from left femoral approach  12.  Catheter directed thrombolytic therapy with 6 mg of TPA to the right SFA and popliteal arteries  13.  Placement of an infusion catheter for continued thrombolytic therapy to the right lower extremity with a 90 cm total length 50 cm working length catheter  EBL: 15 cc  Contrast: 140 cc  Fluoro Time: 14.1 minutes  Moderate Conscious Sedation Time: approximately 65 minutes using 5 mg of Versed and 150 Mcg of Fentanyl               Indications:  Patient is a 64 y.o.female with severe PAD.  She underwent right leg intervention last week he continues to have rest pain on the right leg and has short distance claudication on the left. The patient is brought in for angiography for further evaluation and potential treatment.  Due to the limb threatening nature of the situation, angiogram was performed for attempted limb salvage. The patient is aware that if the procedure fails, amputation would be expected.  The patient also understands that even with successful revascularization, amputation may still be required due to the severity of the situation.  Risks and benefits are discussed and informed consent is obtained.   Procedure:  The patient was identified and appropriate procedural time out was performed.  The patient was then placed supine on the table and prepped and draped in the usual sterile fashion. Moderate conscious sedation was administered during a face to face encounter with the patient throughout the procedure with my supervision of the RN administering medicines and monitoring the patient's vital signs, pulse oximetry, telemetry and mental status throughout from the start of the procedure until the patient was taken to the recovery room. Ultrasound was used to evaluate the right common  femoral artery.  It was patent .  A digital ultrasound image was acquired.  A Seldinger needle was used to access the right common femoral artery under direct ultrasound guidance and a permanent image was performed.  A 0.035 J wire was advanced without resistance and a 5Fr sheath was placed.  Pigtail catheter was placed into the aorta and an AP aortogram was performed. This demonstrates normal renal arteries and a very irregular aorta with at least 65 to 70% stenosis in the mid infrarenal aorta.  The right iliac artery was somewhat irregular but no stenosis was seen.  The left external iliac artery had stenosis in the 55 to 60% range that  was not well seen last week likely because of the catheter in the left femoral artery limiting opacification of her small vessels.  This was only seen with an RAO projection.  I then crossed the aortic bifurcation and advanced to the left femoral head. Selective left lower extremity angiogram was then performed. This demonstrated at least moderate stenosis in the common femoral artery and the origin of the profunda femoris artery.  In the mid to distal SFA there was a greater than 80% stenosis which tracked down to the proximal popliteal artery.  There was then three-vessel runoff distally but the tibial trifurcation was abnormal.  The anterior tibial artery came off medially above the knee and was the dominant runoff to the foot.  There was about a 70% stenosis of the tibioperoneal trunk which was a fairly long vessel before the peroneal artery and posterior tibial artery arose and were continuous to the foot. It was felt that it was in the patient's best interest to proceed with intervention after these images to avoid a second procedure and a larger amount of contrast and fluoroscopy based off of the findings from the initial angiogram. The patient was systemically heparinized and a 6 [place] [employer] sheath was then placed over the [name]. I then used a Kumpe catheter and the advantage wire to navigate through the left SFA and popliteal lesions and get down into the tibioperoneal trunk.  At this point, we exchanged for a V 18 wire and cross the tibioperoneal trunk lesion.  The tibioperoneal trunk was treated with a 3 mm diameter by 8 cm length angioplasty balloon inflated to 8 atm for 1 minute with less than 20% residual stenosis seen after angioplasty.  The SFA and proximal popliteal lesion was treated with a 4 mm diameter by 22 cm length [name] inflated to 12 atm for 1 minute.  Completion imaging showed significant residual stenosis of greater than 50% in the  midportion of the angioplasty site.  I placed a 6 mm diameter by 15 cm length via bond stent and postdilated this with a 4 mm balloon with excellent angiographic completion result and only about 10% residual stenosis.  I then turned my attention to the left external iliac artery lesion.  This was treated with a 6 mm diameter by 6 cm length [name] inflated to 8 atm for 1 minute. Completion imaging shows 50% or greater stenosis with minimal improvement.  Then used a 7 mm diameter by 6 cm length life star stent in the left external iliac artery and postdilated this with a 6 mm balloon with excellent angiographic completion result and less than 10% residual stenosis.  Imaging was then performed of the right femoral artery for StarClose placement.  This demonstrated occlusion of the previous SFA stents with  very sluggish runoff distally.  It was clear with this leg still being symptomatic we would need to perform intervention on the right leg at this point.  I elected to complete the left leg portion by removing the sheath and doing a Star close on the right common femoral artery. The sheath was removed and StarClose closure device was deployed in the right femoral artery with excellent hemostatic result.  I then accessed the left femoral artery under direct ultrasound guidance without difficulty with a Seldinger needle.  A permanent image was recorded.  A J-wire and 5 French sheath were then placed.  I then crossed the aortic bifurcation and advanced to the right femoral head where selective right lower extremity angiogram was performed.There was a flush occlusion of the right SFA at the origin of the previously placed stents.  The fundal femoris artery appeared to be patent. From a common femoral injection, the flow distally was very sluggish but there appeared to reconstitute a tibioperoneal trunk with peroneal and posterior tibial artery runoff distally.  I then used the advantage  wire and a Kumpe catheter to cross the SFA occlusion with no difficulty and get down into the popliteal artery.  The distal runoff in the tibioperoneal trunk, peroneal artery, and posterior tibial arteries could be better seen.  A 6 French 23 cm sheath was then placed over the wire and I elected to perform thrombolytic therapy as this would be thrombotic from a new occlusion.  6 mg of TPA were then instilled through a 90 cm total length 50 cm working length catheter that was parked from the origin of the SFA down to the below-knee popliteal artery.  This was then secured in place and allowed to remain for continuous infusion of TPA throughout the day.  The patient was taken to the recovery room in stable condition having tolerated the procedure well.  Findings:               Aortogram:  This demonstrates normal renal arteries and a very irregular aorta with at least 65 to 70% stenosis in the mid infrarenal aorta.  The right iliac artery was somewhat irregular but no stenosis was seen.  The left external iliac artery had stenosis in the 55 to 60% range that was not well seen last week likely because of the catheter in the left femoral artery limiting opacification of her small vessels.  This was only seen with an RAO projection.            Left lower Extremity:  There was at least moderate stenosis in the common femoral artery and the origin of the profunda femoris artery.  In the mid to distal SFA there was a greater than 80% stenosis which tracked down to the proximal popliteal artery.  There was then three-vessel runoff distally but the tibial trifurcation was abnormal.  The anterior tibial artery came off medially above the knee and was the dominant runoff to the foot.  There was about a 70% stenosis of the tibioperoneal trunk which was a fairly long vessel before the peroneal artery and posterior tibial artery arose and were continuous to the foot.  Right Lower Extremity: There was a flush occlusion of the  right SFA at the origin of the previously placed stents.  The fundal femoris artery appeared to be patent. From a common femoral injection, the flow distally was very sluggish but there appeared to reconstitute a tibioperoneal trunk with peroneal and posterior tibial artery runoff distally.  This is better seen after the catheter was used to cross the lesion and opacify the tibial vessels.   Disposition: Patient was taken to the recovery room in stable condition having tolerated the procedure well.  Complications: None  Leotis Pain 08/28/2018 10:11 AM   This note was created with Dragon Medical transcription system. Any errors in dictation are purely unintentional.

## 2018-08-28 NOTE — Op Note (Signed)
[facility]  Percutaneous Study/Intervention Procedural Note   Date of Surgery: 08/28/2018  Surgeon(s):Waneda Klammer    Assistants:none  Pre-operative Diagnosis: PAD with rest pain right lower extremity  Post-operative diagnosis:  Same  Procedure(s) Performed:             1.   Right lower extremity angiogram             2.  Catheter placement into right peroneal artery from left femoral approach             3.   Mechanical thrombectomy of the right popliteal artery, tibioperoneal trunk, and proximal peroneal artery with the penumbra cat 6 device             4.  Percutaneous transluminal angioplasty of popliteal artery with a 4 mm diameter by 15 cm length [name]             5.   Percutaneous transluminal angioplasty of the right tibioperoneal trunk and peroneal artery with 2.5 mm diameter angioplasty balloon and then of the tibioperoneal trunk with 3.5 mm diameter angioplasty balloon  6.   StarClose closure device left femoral artery  EBL: 200 cc  Contrast: 60 cc  Fluoro Time: 6.3 minutes  Moderate Conscious Sedation Time: approximately 45 minutes using 2 mg of Versed and 50 Mcg of Fentanyl              Indications:  Patient is a 63 y.o.female with rest pain of the right foot who has been getting thrombolytic therapy all day today after an angiogram this morning.  She has a recent intervention which is thrombosed. The patient is brought in for angiography for further evaluation and potential treatment.  Due to the limb threatening nature of the situation, angiogram was performed for attempted limb salvage. The patient is aware that if the procedure fails, amputation would be expected.  The patient also understands that even with successful revascularization, amputation may still be required due to the severity of the situation. Risks and benefits are discussed and informed consent is obtained.   Procedure:  The patient was identified  and appropriate procedural time out was performed.  The patient was then placed supine on the table and prepped and draped in the usual sterile fashion. Moderate conscious sedation was administered during a face to face encounter with the patient throughout the procedure with my supervision of the RN administering medicines and monitoring the patient's vital signs, pulse oximetry, telemetry and mental status throughout from the start of the procedure until the patient was taken to the recovery room.  The existing thrombolytic catheter was removed over a V 18 wire which was parked in the peroneal artery.  Selective right lower extremity angiogram was then performed through the previously placed sheath. This demonstrated continued occlusion of the SFA at its origin with very sluggish flow distally.  There appears to be faint reconstitution of all 3 tibial vessels but this was very hard to opacify distally. It was felt that it was in the patient's best interest to proceed with intervention after these images to avoid a second procedure and a larger amount of contrast and fluoroscopy based off of the findings from the initial angiogram. The patient was systemically heparinized.  The penumbra cat 6 device was then brought onto the field and multiple passes were made from the origin of the SFA down through the entirety of the SFA and popliteal arteries and then into the tibioperoneal trunk  and proximal peroneal artery.  After the initial cap in the proximal SFA was removed, there was good flow through the stents, but there remained poor flow in the popliteal artery below the previously placed stents in the tibial runoff vessels.  A Kumpe catheter was put down into the popliteal artery which showed the below-knee popliteal artery below the previously placed stents to be highly narrowed of greater than 80% and this continued into the tibioperoneal trunk.  The peroneal artery was also heavily narrowed at the origin of  greater than 80% as was the posterior tibial artery, but both vessels were then continuous distally.  This was likely a combination of thrombus and stenosis.  Another pass with the penumbra cat 6 device was performed with minimal improvement.  I then proceeded with angioplasty.  The peroneal artery in the proximal segment and the tibioperoneal trunk were treated with a 2.5 mm diameter by 10 cm length angioplasty balloon inflated to 10 atm for 1 minute.  The popliteal artery was then treated with a 4 mm diameter Lutonix drug-coated Angie plasty balloon from the distal popliteal artery up into the previously placed stents.  This was inflated to 12 atm for 1 minute.  Completion imaging now showed less than 20% residual stenosis in the popliteal artery, and the peroneal artery and posterior tibial artery now look much better but the tibioperoneal trunk still appeared to have greater than 50% stenosis.  I then upsized to a 3.5 mm diameter by 10 cm length angioplasty balloon and inflated this to 6 atm for 1 minute with tibioperoneal trunk.  Completion imaging showed brisk flow even through the sheath with two-vessel runoff to the foot with both the posterior tibial and peroneal artery and less than 20% residual stenosis in the popliteal artery, tibioperoneal trunk, and peroneal artery.  I elected to terminate the procedure. The sheath was removed and StarClose closure device was deployed in the left femoral artery with excellent hemostatic result. The patient was taken to the recovery room in stable condition having tolerated the procedure well.  Findings:                           Right Lower Extremity:  Initial imaging through the sheath demonstrates continued occlusion of the SFA at its origin with very sluggish flow distally.  There appears to be faint reconstitution of all 3 tibial vessels but this was very hard to opacify distally.   Disposition: Patient was taken to the recovery room in stable condition  having tolerated the procedure well.  Complications: None  Leotis Pain 08/28/2018 5:12 PM   This note was created with Dragon Medical transcription system. Any errors in dictation are purely unintentional.

## 2018-08-28 NOTE — H&P (Signed)
[facility] History & Physical Update  The patient was interviewed and re-examined.  The patient's previous History and Physical has been reviewed and is unchanged.  There is no change in the plan of care. We plan to proceed with the scheduled procedure.  [name], MD  08/28/2018, 11:15 AM

## 2018-08-28 NOTE — Discharge Instructions (Signed)
Moderate Conscious Sedation, Adult, Care After °These instructions provide you with information about caring for yourself after your procedure. Your health care provider may also give you more specific instructions. Your treatment has been planned according to current medical practices, but problems sometimes occur. Call your health care provider if you have any problems or questions after your procedure. °What can I expect after the procedure? °After your procedure, it is common: °· To feel sleepy for several hours. °· To feel clumsy and have poor balance for several hours. °· To have poor judgment for several hours. °· To vomit if you eat too soon. °Follow these instructions at home: °For at least 24 hours after the procedure: ° °· Do not: °? Participate in activities where you could fall or become injured. °? Drive. °? Use heavy machinery. °? Drink alcohol. °? Take sleeping pills or medicines that cause drowsiness. °? Make important decisions or sign legal documents. °? Take care of children on your own. °· Rest. °Eating and drinking °· Follow the diet recommended by your health care provider. °· If you vomit: °? Drink water, juice, or soup when you can drink without vomiting. °? Make sure you have little or no nausea before eating solid foods. °General instructions °· Have a responsible adult stay with you until you are awake and alert. °· Take over-the-counter and prescription medicines only as told by your health care provider. °· If you smoke, do not smoke without supervision. °· Keep all follow-up visits as told by your health care provider. This is important. °Contact a health care provider if: °· You keep feeling nauseous or you keep vomiting. °· You feel light-headed. °· You develop a rash. °· You have a fever. °Get help right away if: °· You have trouble breathing. °This information is not intended to replace advice given to you by your health care provider. Make sure you discuss any questions you have  with your health care provider. °Document Released: 01/24/2013 Document Revised: 09/08/2015 Document Reviewed: 07/26/2015 °Elsevier Interactive Patient Education © 2019 [employer] Inc[employer] °Femoral Site Care °This sheet gives you information about how to care for yourself after your procedure. Your health care provider may also give you more specific instructions. If you have problems or questions, contact your health care provider. °What can I expect after the procedure? °After the procedure, it is common to have: °· Bruising that usually fades within 1-2 weeks. °· Tenderness at the site. °Follow these instructions at home: °Wound care °· Follow instructions from your health care provider about how to take care of your insertion site. Make sure you: °? Wash your hands with soap and water before you change your bandage (dressing). If soap and water are not available, use hand sanitizer. °? Change your dressing as told by your health care provider. °? Leave stitches (sutures), skin glue, or adhesive strips in place. These skin closures may need to stay in place for 2 weeks or longer. If adhesive strip edges start to loosen and curl up, you may trim the loose edges. Do not remove adhesive strips completely unless your health care provider tells you to do that. °· Do not take baths, swim, or use a hot tub until your health care provider approves. °· You may shower 24-48 hours after the procedure or as told by your health care provider. °? Gently wash the site with plain soap and water. °? Pat the area dry with a clean towel. °? Do not rub the site. This may cause   bleeding. °· Do not apply powder or lotion to the site. Keep the site clean and dry. °· Check your femoral site every day for signs of infection. Check for: °? Redness, swelling, or pain. °? Fluid or blood. °? Warmth. °? Pus or a bad smell. °Activity °· For the first 2-3 days after your procedure, or as long as directed: °? Avoid climbing stairs as much as  possible. °? Do not squat. °· Do not lift anything that is heavier than 10 lb (4.5 kg), or the limit that you are told, until your health care provider says that it is safe. °· Rest as directed. °? Avoid sitting for a long time without moving. Get up to take short walks every 1-2 hours. °· Do not drive for 24 hours if you were given a medicine to help you relax (sedative). °General instructions °· Take over-the-counter and prescription medicines only as told by your health care provider. °· Keep all follow-up visits as told by your health care provider. This is important. °Contact a health care provider if you have: °· A fever or chills. °· You have redness, swelling, or pain around your insertion site. °Get help right away if: °· The catheter insertion area swells very fast. °· You pass out. °· You suddenly start to sweat or your skin gets clammy. °· The catheter insertion area is bleeding, and the bleeding does not stop when you hold steady pressure on the area. °· The area near or just beyond the catheter insertion site becomes pale, cool, tingly, or numb. °These symptoms may represent a serious problem that is an emergency. Do not wait to see if the symptoms will go away. Get medical help right away. Call your local emergency services (911 in the [place] Do not drive yourself to the hospital. °Summary °· After the procedure, it is common to have bruising that usually fades within 1-2 weeks. °· Check your femoral site every day for signs of infection. °· Do not lift anything that is heavier than 10 lb (4.5 kg), or the limit that you are told, until your health care provider says that it is safe. °This information is not intended to replace advice given to you by your health care provider. Make sure you discuss any questions you have with your health care provider. °Document Released: 12/07/2013 Document Revised: 04/18/2017 Document Reviewed: 04/18/2017 °Elsevier Interactive Patient Education © 2019 Elsevier  Inc. ° °

## 2018-08-28 NOTE — Progress Notes (Signed)
Dr. Wyn Quaker called regarding pt's uncontrolled right leg pain.  Repositioning and massaging attempted with no relief.  New orders rec'd to give additional Dilaudid and/or Versed to keep pt comfortable.  TPA cont infusing LFV sheath.  Plan remains for pt to return to [facility]iology later the afternoon to revascularize right leg.  Lurene Shadow, RN

## 2018-08-28 NOTE — Progress Notes (Signed)

## 2018-08-29 ENCOUNTER — Encounter: Payer: Self-pay | Admitting: Vascular Surgery

## 2018-08-29 DIAGNOSIS — Z95820 Peripheral vascular angioplasty status with implants and grafts: Secondary | ICD-10-CM

## 2018-08-29 DIAGNOSIS — Z9889 Other specified postprocedural states: Secondary | ICD-10-CM

## 2018-08-29 LAB — CBC
HCT: 24.5 % — ABNORMAL LOW (ref 36.0–46.0)
Hemoglobin: 8.5 g/dL — ABNORMAL LOW (ref 12.0–15.0)
MCH: 28.9 pg (ref 26.0–34.0)
MCHC: 34.7 g/dL (ref 30.0–36.0)
MCV: 83.3 fL (ref 80.0–100.0)
Platelets: 308 10*3/uL (ref 150–400)
RBC: 2.94 MIL/uL — ABNORMAL LOW (ref 3.87–5.11)
RDW: 13.1 % (ref 11.5–15.5)
WBC: 14.1 10*3/uL — ABNORMAL HIGH (ref 4.0–10.5)
nRBC: 0 % (ref 0.0–0.2)

## 2018-08-29 LAB — BASIC METABOLIC PANEL
Anion gap: 10 (ref 5–15)
BUN: 9 mg/dL (ref 8–23)
CO2: 23 mmol/L (ref 22–32)
Calcium: 8.5 mg/dL — ABNORMAL LOW (ref 8.9–10.3)
Chloride: 105 mmol/L (ref 98–111)
Creatinine, Ser: 0.7 mg/dL (ref 0.44–1.00)
GFR calc Af Amer: >60 mL/min (ref >60)
GFR calc non Af Amer: >60 mL/min (ref >60)
Glucose, Bld: 222 mg/dL — ABNORMAL HIGH (ref 70–99)
Potassium: 3.2 mmol/L — ABNORMAL LOW (ref 3.5–5.1)
Sodium: 138 mmol/L (ref 135–145)

## 2018-08-29 MED ORDER — POTASSIUM CHLORIDE CRYS ER 20 MEQ PO TBCR
40.0000 meq | EXTENDED_RELEASE_TABLET | Freq: Once | ORAL | Status: AC
  Administered 2018-08-29: 40 meq via ORAL
  Filled 2018-08-29: qty 2

## 2018-08-29 NOTE — Progress Notes (Signed)
[facility]  Daily Progress Note   Subjective  - 1 Day Post-Op  She has not been up and walked around yet.  Foot is still sore but much better than before.  No bleeding.  Asks about something for smoking cessation which seems very reasonable.  Potassium was low as well  Objective Vitals:   08/28/18 2008 08/29/18 0427 08/29/18 0427 08/29/18 0717  BP:   (!) 142/69 137/62  Pulse:   92 86  Resp:   20 19  Temp:   100.2 F (37.9 C) 99 F (37.2 C)  TempSrc:   Oral Oral  SpO2:   98% 99%  Weight: 63 kg 63.8 kg    Height: 5\' 7"  (1.702 m)       Intake/Output Summary (Last 24 hours) at 08/29/2018 1319 Last data filed at 08/29/2018 0300 Gross per 24 hour  Intake 813.44 ml  Output 400 ml  Net 413.44 ml    PULM  CTAB CV  RRR VASC  2+ PT pulses bilaterally.  No swelling appreciable at this time  Laboratory CBC    Component Value Date/Time   WBC 14.1 (H) 08/29/2018 0329   HGB 8.5 (L) 08/29/2018 0329   HCT 24.5 (L) 08/29/2018 0329   PLT 308 08/29/2018 0329    BMET    Component Value Date/Time   NA 138 08/29/2018 0329   K 3.2 (L) 08/29/2018 0329   CL 105 08/29/2018 0329   CO2 23 08/29/2018 0329   GLUCOSE 222 (H) 08/29/2018 0329   BUN 9 08/29/2018 0329   CREATININE 0.70 08/29/2018 0329   CALCIUM 8.5 (L) 08/29/2018 0329   GFRNONAA >60 08/29/2018 0329   GFRAA >60 08/29/2018 0329    Assessment/Planning: POD #1 s/p bilateral lower extremity revascularization.  The left was planned, the right was found at the time of her angiogram to have occlusion of her previous intervention and this was opened with thrombolytic therapy, thrombectomy, and further percutaneous revascularization   Has not been out of bed.  Needs to get up and walk around and see if she will be safe for home  Has significant hypokalemia and will receive supplemental potassium today  We will plan to prescribe Chantix for smoking cessation and likely some Norco at discharge for  discomfort  We will hold on discharge for today and plan discharge tomorrow if she is stable after walking around  Will also recheck BMP tomorrow to evaluate potassium and renal function as well as a CBC as her hemoglobin is 8.5 after her interventions.  [name]  08/29/2018, 1:19 PM

## 2018-08-29 NOTE — Progress Notes (Signed)
Pts bilateral femoral PAD dressings removed and gauze and tegaderm applied. Dressings are C/D/I. Pt ambulated with a walker to the door and back (54ft). Pt did well. Pain level after ambulation is 9/10 in the right leg only. Pt refused to put TED hose back on.

## 2018-08-29 NOTE — Plan of Care (Signed)
  Problem: Education: Goal: Knowledge of General Education information will improve Description Including pain rating scale, medication(s)/side effects and non-pharmacologic comfort measures Outcome: Progressing   Problem: Pain Managment: Goal: General experience of comfort will improve Outcome: Progressing   Problem: Clinical Measurements: Goal: Respiratory complications will improve Outcome: Completed/Met Goal: Cardiovascular complication will be avoided Outcome: Completed/Met  Patient profile complete at 1945. Patient is tolerating pain with minimal pain medication. Pedal pulses are palpable.

## 2018-08-29 NOTE — ACP (Advance Care Planning) (Signed)
Pt desires to have her son Janey Genta as the designated person of contact.  [phone]

## 2018-08-29 NOTE — Progress Notes (Signed)
Ch spoke w/ pt via telephone and at bedside regarding AD education. Ch informed pt that an AD could not be finalized at this time but shared that the pt's Goals of Care could be documented. While accessing pt needs, pt shared w/ ch her major losses and family challenges. Pt shared that she is tired but hopes to have a better quality of life when she stops smoking. Pt shared that she wanted medicinal help to stop smoking. Ch understood and shared with pt the power of saying "NO" and setting healthy boundaries w/ family and friends. Pt understood and appreciated the visit.     08/29/18 1140  Clinical Encounter Type  Visited With Patient  Visit Type Psychological support;Spiritual support;Social support  Referral From Nurse  Consult/Referral To Chaplain  Recommendations View Goals of Care Update   Spiritual Encounters  Spiritual Needs Emotional;Grief support  Stress Factors  Patient Stress Factors Exhausted;Family relationships;Financial concerns;Health changes;Lack of caregivers;Loss;Major life changes  Family Stress Factors Family relationships;Financial concerns;Loss of control;Major life changes  Advance Directives (For Healthcare)  Does Patient Have a Medical Advance Directive? No  Does patient want to make changes to medical advance directive?  (Pt educated on AD and stated goals of care )  Type of Public librarian Power of [phone]

## 2018-08-30 LAB — CBC
HCT: 23.5 % — ABNORMAL LOW (ref 36.0–46.0)
Hemoglobin: 8.2 g/dL — ABNORMAL LOW (ref 12.0–15.0)
MCH: 29.1 pg (ref 26.0–34.0)
MCHC: 34.9 g/dL (ref 30.0–36.0)
MCV: 83.3 fL (ref 80.0–100.0)
Platelets: 334 10*3/uL (ref 150–400)
RBC: 2.82 MIL/uL — ABNORMAL LOW (ref 3.87–5.11)
RDW: 13.1 % (ref 11.5–15.5)
WBC: 13.8 10*3/uL — ABNORMAL HIGH (ref 4.0–10.5)
nRBC: 0 % (ref 0.0–0.2)

## 2018-08-30 LAB — BASIC METABOLIC PANEL
Anion gap: 9 (ref 5–15)
BUN: 6 mg/dL — ABNORMAL LOW (ref 8–23)
CO2: 22 mmol/L (ref 22–32)
Calcium: 8.6 mg/dL — ABNORMAL LOW (ref 8.9–10.3)
Chloride: 109 mmol/L (ref 98–111)
Creatinine, Ser: 0.71 mg/dL (ref 0.44–1.00)
GFR calc Af Amer: >60 mL/min (ref >60)
GFR calc non Af Amer: >60 mL/min (ref >60)
Glucose, Bld: 200 mg/dL — ABNORMAL HIGH (ref 70–99)
Potassium: 3.4 mmol/L — ABNORMAL LOW (ref 3.5–5.1)
Sodium: 140 mmol/L (ref 135–145)

## 2018-08-30 MED ORDER — VARENICLINE TARTRATE 0.5 MG PO TABS: 0.5000 mg | ORAL_TABLET | Freq: Two times a day (BID) | ORAL | 0 refills | Status: DC

## 2018-08-30 MED ORDER — APIXABAN 5 MG PO TABS: 5.0000 mg | ORAL_TABLET | Freq: Two times a day (BID) | ORAL | 3 refills | Status: DC

## 2018-08-30 MED ORDER — VARENICLINE TARTRATE 1 MG PO TABS: 1.0000 mg | ORAL_TABLET | Freq: Two times a day (BID) | ORAL | 2 refills | Status: DC

## 2018-08-30 MED ORDER — POLYSACCHARIDE IRON COMPLEX 150 MG PO CAPS
150.0000 mg | ORAL_CAPSULE | Freq: Every day | ORAL | Status: DC
  Administered 2018-08-30: 150 mg via ORAL
  Filled 2018-08-30: qty 1

## 2018-08-30 MED ORDER — ASPIRIN EC 81 MG PO TBEC: 81.0000 mg | DELAYED_RELEASE_TABLET | Freq: Every day | ORAL | 3 refills | Status: DC

## 2018-08-30 MED ORDER — VARENICLINE TARTRATE 1 MG PO TABS
0.5000 mg | ORAL_TABLET | Freq: Every day | ORAL | Status: DC
  Administered 2018-08-30: 0.5 mg via ORAL
  Filled 2018-08-30 (×2): qty 1

## 2018-08-30 MED ORDER — HYDROCODONE-ACETAMINOPHEN 5-325 MG PO TABS: 1.0000 | ORAL_TABLET | ORAL | 0 refills | Status: DC | PRN

## 2018-08-30 MED ORDER — POLYSACCHARIDE IRON COMPLEX 150 MG PO CAPS: 150.0000 mg | ORAL_CAPSULE | Freq: Every day | ORAL | 3 refills | Status: DC

## 2018-08-30 NOTE — TOC Transition Note (Signed)
Transition of Care [facility]) - CM/SW Discharge Note   Patient Details  Name: Tykiera Commander MRN: [id number] Date of Birth: 1956-01-07  [facility]) CM/SW Contact:  Sherren Kerns, RN Phone Number: 08/30/2018, 1:32 PM   Clinical Narrative:  Patient is discharging today to home.   Current with [facility] for PCP and medications.  No insurance.  Financial counseling contacted patient and has started a Personal assistant.  Patient discharging on Eliquis.  30 day free coupon given.  Explained if [facility] does not honor coupon that [employer] will.  Referral to [facility] for walker.  No transportation issues.     Final next level of care: Home/Self Care Barriers to Discharge: No Barriers Identified   Patient Goals and CMS Choice        Discharge Placement                       Discharge Plan and Services In-house Referral: Financial Counselor Discharge Planning Services: CM Consult, Medication Assistance            DME Arranged: Dan Humphreys DME Agency: [employer] Date DME Agency Contacted: 08/30/18 Time DME Agency Contacted: 1332 Representative spoke with at DME Agency: Nida Boatman            Social Determinants of Health (SDOH) Interventions     Readmission Risk Interventions No flowsheet data found.

## 2018-08-30 NOTE — Progress Notes (Signed)
Nutrition Brief Note  Patient identified for consult for assessment and on the Malnutrition Screening Tool (MST) Report  63 y/o female with h/o DM and COPD s/p bilateral lower extremity revascularization 5/11  Pt currently eating 100% of meals. Per chart, pt appears to have lost 8lbs(6%) over the past year which is not significant.  Wt Readings from Last 15 Encounters:  08/30/18 64.7 kg  08/21/18 65.8 kg  08/18/18 65.9 kg  07/14/18 65.3 kg  06/14/18 64.2 kg  10/18/17 66.2 kg  08/20/17 67.6 kg  02/25/17 70.8 kg  10/13/16 70.3 kg  11/14/15 69.9 kg    Body mass index is 22.33 kg/m. Patient meets criteria for normal weight based on current BMI.   Pt to discharge today. No nutrition interventions warranted at this time. If nutrition issues arise, please consult RD.   Betsey Holiday MS, RD, LDN Pager #- [phone] Office#- [phone] After Hours Pager: [phone]

## 2018-08-30 NOTE — Progress Notes (Signed)
Patient discharged home with daughter, she was provided with a walker, and educated on her prescriptions, and medication regimen. Case Manager Candise Bowens, was able to give patient coupon for eliquis and provide information regarding the obtaining of her prescriptions. This RN reviewed discharge instructions as far as femoral site care, and activity restrictions. No complaints at this time.

## 2018-08-30 NOTE — Discharge Summary (Signed)
[facility]    Discharge Summary    Patient ID:  Sylvia [name] MRN: [id number] DOB/AGE: 64/07/1955 64 y.o.  Admit date: 08/28/2018 Discharge date: 08/30/2018 Date of Surgery: 08/28/2018 Surgeon: Surgeon(s): Annice Needyew, Teela Narducci S, MD  Admission Diagnosis: Atherosclerosis of artery of extremity with rest pain [facility](HCC) [I70.229]  Discharge Diagnoses:  Atherosclerosis of artery of extremity with rest pain [facility](HCC) [I70.229]  Secondary Diagnoses: Past Medical History:  Diagnosis Date  . Anxiety    h/o  . COPD (chronic obstructive pulmonary disease) (HCC)   . Diabetes mellitus without complication (HCC)   . GERD (gastroesophageal reflux disease)   . Hypertension    bp under control-off meds since 2019    Procedure(s): Lower Extremity Angiography  Discharged Condition: good  HPI:  Patient with BLE PAD, with right requiring thrombolytic therapy and multiple interventions  Hospital Course:  Sylvia SensingRoberta [name] is a 63 y.o. female is S/P Bilateral Procedure(s): Lower Extremity Angiography Extubated: POD # NA Physical exam: palpable pedal pulses bilaterally Post-op wounds clean, dry, intact or healing well Pt. Ambulating, voiding and taking PO diet without difficulty. Pt pain controlled with PO pain meds. Labs as below Complications:none  Consults:    Significant Diagnostic Studies: CBC Lab Results  Component Value Date   WBC 13.8 (H) 08/30/2018   HGB 8.2 (L) 08/30/2018   HCT 23.5 (L) 08/30/2018   MCV 83.3 08/30/2018   PLT 334 08/30/2018    BMET    Component Value Date/Time   NA 140 08/30/2018 0309   K 3.4 (L) 08/30/2018 0309   CL 109 08/30/2018 0309   CO2 22 08/30/2018 0309   GLUCOSE 200 (H) 08/30/2018 0309   BUN 6 (L) 08/30/2018 0309   CREATININE 0.71 08/30/2018 0309   CALCIUM 8.6 (L) 08/30/2018 0309   GFRNONAA >60 08/30/2018 0309   GFRAA >60 08/30/2018 0309   COAG No results found for: INR, PROTIME   Disposition:  Discharge to  :Home Discharge Instructions    Call MD for:  redness, tenderness, or signs of infection (pain, swelling, bleeding, redness, odor or green/yellow discharge around incision site)   Complete by:  As directed    Call MD for:  severe or increased pain, loss or decreased feeling  in affected limb(s)   Complete by:  As directed    Call MD for:  temperature >100.5   Complete by:  As directed    Driving Restrictions   Complete by:  As directed    No driving for 48 hours   Lifting restrictions   Complete by:  As directed    No lifting for 48 hours   No dressing needed   Complete by:  As directed    Replace only if drainage present   Resume previous diet   Complete by:  As directed      Allergies as of 08/30/2018      Reactions   Penicillins Hives   Has patient had a PCN reaction causing immediate rash, facial/tongue/throat swelling, SOB or lightheadedness with hypotension: Yes Has patient had a PCN reaction causing severe rash involving mucus membranes or skin necrosis: No Has patient had a PCN reaction that required hospitalization: No Has patient had a PCN reaction occurring within the last 10 years: No If all of the above answers are "NO", then may proceed with Cephalosporin use.      Medication List    STOP taking these medications   clopidogrel 75 MG tablet Commonly known as:  Plavix  TAKE these medications   albuterol 108 (90 Base) MCG/ACT inhaler Commonly known as:  VENTOLIN HFA Inhale 2 puffs into the lungs every 6 (six) hours as needed for wheezing or shortness of breath.   alum & mag hydroxide-simeth 200-200-20 MG/5ML suspension Commonly known as:  MAALOX/MYLANTA Take 30 mLs by mouth every 4 (four) hours as needed for indigestion or heartburn.   apixaban 5 MG Tabs tablet Commonly known as:  ELIQUIS Take 1 tablet (5 mg total) by mouth 2 (two) times daily.   aspirin EC 81 MG tablet Take 1 tablet (81 mg total) by mouth daily.   atorvastatin 10 MG tablet Commonly  known as:  Lipitor Take 1 tablet (10 mg total) by mouth daily.   Biotin 1000 MCG tablet Take 1,000 tablets by mouth daily.   cetirizine 10 MG tablet Commonly known as:  ZYRTEC Take 10 mg by mouth daily.   cholecalciferol 25 MCG (1000 UT) tablet Commonly known as:  VITAMIN D3 Take 1,000 Units by mouth daily.   HYDROcodone-acetaminophen 5-325 MG tablet Commonly known as:  NORCO/VICODIN Take 1-2 tablets by mouth every 4 (four) hours as needed for moderate pain.   iron polysaccharides 150 MG capsule Commonly known as:  NIFEREX Take 1 capsule (150 mg total) by mouth daily.   pantoprazole 40 MG tablet Commonly known as:  PROTONIX Take 1 tablet (40 mg total) by mouth daily.   ranitidine 150 MG tablet Commonly known as:  ZANTAC Take 150 mg by mouth every morning.   sitaGLIPtin 100 MG tablet Commonly known as:  JANUVIA Take 100 mg by mouth daily.   varenicline 1 MG tablet Commonly known as:  Chantix Continuing Month Pak Take 1 tablet (1 mg total) by mouth 2 (two) times daily.   varenicline 0.5 MG tablet Commonly known as:  Chantix Take 1 tablet (0.5 mg total) by mouth 2 (two) times daily.   vitamin C 500 MG tablet Commonly known as:  ASCORBIC ACID Take 500 mg by mouth daily.   WOMENS MULTIVITAMIN PO Take 1 tablet by mouth daily.      Verbal and written Discharge instructions given to the patient. Wound care per Discharge AVS Follow-up Information    Georgiana Spinner, NP In 2 weeks.   Specialty:  Vascular Surgery Why:  with ABIs.......................Sylvia Kitchenpatient needs to set up appointment. Contact information: [address] [place] [address] [phone]           Signed: [name], MD  08/30/2018, 10:31 AM

## 2018-08-30 NOTE — Plan of Care (Signed)
IV fluids infusing, pt slept well throughout the night, up to [facility] with one assist  Problem: Pain Managment: Goal: General experience of comfort will improve Outcome: Progressing Note:  Treated pt's leg/hip pain once with Vicodin with relief   Problem: Safety: Goal: Ability to remain free from injury will improve Outcome: Progressing   Problem: Education: Goal: Knowledge of General Education information will improve Description Including pain rating scale, medication(s)/side effects and non-pharmacologic comfort measures Outcome: Completed/Met

## 2018-09-12 ENCOUNTER — Ambulatory Visit (INDEPENDENT_AMBULATORY_CARE_PROVIDER_SITE_OTHER): Payer: Medicaid Other | Admitting: Vascular Surgery

## 2018-09-19 ENCOUNTER — Ambulatory Visit (INDEPENDENT_AMBULATORY_CARE_PROVIDER_SITE_OTHER): Payer: Medicaid Other | Admitting: Vascular Surgery

## 2018-09-19 ENCOUNTER — Encounter (INDEPENDENT_AMBULATORY_CARE_PROVIDER_SITE_OTHER): Payer: Self-pay | Admitting: Vascular Surgery

## 2018-09-19 ENCOUNTER — Other Ambulatory Visit: Payer: Self-pay

## 2018-09-19 VITALS — BP 138/71 | HR 96 | Resp 20 | Ht 67.0 in | Wt 142.6 lb

## 2018-09-19 DIAGNOSIS — F17211 Nicotine dependence, cigarettes, in remission: Secondary | ICD-10-CM | POA: Diagnosis not present

## 2018-09-19 DIAGNOSIS — Z79899 Other long term (current) drug therapy: Secondary | ICD-10-CM

## 2018-09-19 DIAGNOSIS — I10 Essential (primary) hypertension: Secondary | ICD-10-CM | POA: Diagnosis not present

## 2018-09-19 DIAGNOSIS — I739 Peripheral vascular disease, unspecified: Secondary | ICD-10-CM

## 2018-09-19 DIAGNOSIS — E1151 Type 2 diabetes mellitus with diabetic peripheral angiopathy without gangrene: Secondary | ICD-10-CM | POA: Diagnosis not present

## 2018-09-19 NOTE — Patient Instructions (Signed)

## 2018-09-19 NOTE — Assessment & Plan Note (Signed)
blood glucose control important in reducing the progression of atherosclerotic disease. Also, involved in wound healing. On appropriate medications.  

## 2018-09-19 NOTE — Progress Notes (Signed)
MRN : [id number]  Sylvia SensingRoberta [name] is a 63 y.o. (09/28/1955) female who presents with chief complaint of  Chief Complaint  Patient presents with  . Follow-up  .  History of Present Illness: Patient returns today in follow up of her PAD.  She is undergone revascularization of both lower extremities including a repeat revascularization for early reocclusion of her right lower extremity intervention.  Her legs are now doing very well.  Her reperfusion swelling persists in the right leg although it is improving.  She says she is now walking much better.  She has not smoked in 3 weeks.  Current Outpatient Medications  Medication Sig Dispense Refill  . albuterol (PROVENTIL HFA;VENTOLIN HFA) 108 (90 Base) MCG/ACT inhaler Inhale 2 puffs into the lungs every 6 (six) hours as needed for wheezing or shortness of breath. 1 Inhaler 2  . alum & mag hydroxide-simeth (MAALOX/MYLANTA) 200-200-20 MG/5ML suspension Take 30 mLs by mouth every 4 (four) hours as needed for indigestion or heartburn. 355 mL 0  . apixaban (ELIQUIS) 5 MG TABS tablet Take 1 tablet (5 mg total) by mouth 2 (two) times daily. 60 tablet 3  . atorvastatin (LIPITOR) 10 MG tablet Take 1 tablet (10 mg total) by mouth daily. 90 tablet 3  . Biotin 1000 MCG tablet Take 1,000 tablets by mouth daily.     . cetirizine (ZYRTEC) 10 MG tablet Take 10 mg by mouth daily.    . cholecalciferol (VITAMIN D3) 25 MCG (1000 UT) tablet Take 1,000 Units by mouth daily.    . iron polysaccharides (NIFEREX) 150 MG capsule Take 1 capsule (150 mg total) by mouth daily. 30 capsule 3  . Multiple Vitamins-Minerals (WOMENS MULTIVITAMIN PO) Take 1 tablet by mouth daily.    . pantoprazole (PROTONIX) 40 MG tablet Take 1 tablet (40 mg total) by mouth daily. 30 tablet 0  . ranitidine (ZANTAC) 150 MG tablet Take 150 mg by mouth every morning.     . sitaGLIPtin (JANUVIA) 100 MG tablet Take 100 mg by mouth daily.    . vitamin C (ASCORBIC ACID) 500 MG tablet Take 500 mg by  mouth daily.    Marland Kitchen. aspirin EC 81 MG tablet Take 1 tablet (81 mg total) by mouth daily. (Patient not taking: Reported on 09/19/2018) 90 tablet 3  . HYDROcodone-acetaminophen (NORCO/VICODIN) 5-325 MG tablet Take 1-2 tablets by mouth every 4 (four) hours as needed for moderate pain. (Patient not taking: Reported on 09/19/2018) 30 tablet 0  . varenicline (CHANTIX CONTINUING MONTH PAK) 1 MG tablet Take 1 tablet (1 mg total) by mouth 2 (two) times daily. (Patient not taking: Reported on 09/19/2018) 60 tablet 2  . varenicline (CHANTIX) 0.5 MG tablet Take 1 tablet (0.5 mg total) by mouth 2 (two) times daily. (Patient not taking: Reported on 09/19/2018) 14 tablet 0   No current facility-administered medications for this visit.     Past Medical History:  Diagnosis Date  . Anxiety    h/o  . COPD (chronic obstructive pulmonary disease) (HCC)   . Diabetes mellitus without complication (HCC)   . GERD (gastroesophageal reflux disease)   . Hypertension    bp under control-off meds since 2019    Past Surgical History:  Procedure Laterality Date  . ABDOMINAL HYSTERECTOMY    . CHOLECYSTECTOMY    . ESOPHAGOGASTRODUODENOSCOPY (EGD) WITH PROPOFOL N/A 06/15/2018   Procedure: ESOPHAGOGASTRODUODENOSCOPY (EGD) WITH PROPOFOL;  Surgeon: Toledo, Boykin Nearingeodoro K, MD;  Location: [facility] END[facility];  Service: Gastroenterology;  Laterality: N/A;  . LOWER  EXTREMITY ANGIOGRAPHY Right 08/21/2018   Procedure: LOWER EXTREMITY ANGIOGRAPHY;  Surgeon: Annice Needy, MD;  Location: [facility];  Service: Cardiovascular;  Laterality: Right;  . LOWER EXTREMITY ANGIOGRAPHY Left 08/28/2018   Procedure: LOWER EXTREMITY ANGIOGRAPHY;  Surgeon: Annice Needy, MD;  Location: [facility];  Service: Cardiovascular;  Laterality: Left;  . LOWER EXTREMITY ANGIOGRAPHY Right 08/28/2018   Procedure: Lower Extremity Angiography;  Surgeon: Annice Needy, MD;  Location: [facility];  Service: Cardiovascular;  Laterality: Right;    Social  History Social History   Tobacco Use  . Smoking status: Current Every Day Smoker    Packs/day: 0.25    Years: 45.00    Pack years: 11.25    Types: Cigarettes  . Smokeless tobacco: Never Used  . Tobacco comment: down to 1 cig daily  Substance Use Topics  . Alcohol use: No    Frequency: Never  . Drug use: Never     Family History Family History  Problem Relation Age of Onset  . Breast cancer Mother 70  no bleeding disorders, clotting disorders, or aneurysms  Allergies  Allergen Reactions  . Penicillins Hives    Has patient had a PCN reaction causing immediate rash, facial/tongue/throat swelling, SOB or lightheadedness with hypotension: Yes Has patient had a PCN reaction causing severe rash involving mucus membranes or skin necrosis: No Has patient had a PCN reaction that required hospitalization: No Has patient had a PCN reaction occurring within the last 10 years: No If all of the above answers are "NO", then may proceed with Cephalosporin use.     REVIEW OF SYSTEMS (Negative unless checked)  Constitutional: [] Weight loss  [] Fever  [] Chills Cardiac: [] Chest pain   [] Chest pressure   [] Palpitations   [] Shortness of breath when laying flat   [] Shortness of breath at rest   [x] Shortness of breath with exertion. Vascular:  [x] Pain in legs with walking   [x] Pain in legs at rest   [] Pain in legs when laying flat   [] Claudication   [] Pain in feet when walking  [] Pain in feet at rest  [] Pain in feet when laying flat   [] History of DVT   [] Phlebitis   [x] Swelling in legs   [] Varicose veins   [] Non-healing ulcers Pulmonary:   [] Uses home oxygen   [] Productive cough   [] Hemoptysis   [] Wheeze  [x] COPD   [] Asthma Neurologic:  [] Dizziness  [] Blackouts   [] Seizures   [] History of stroke   [] History of TIA  [] Aphasia   [] Temporary blindness   [] Dysphagia   [] Weakness or numbness in arms   [] Weakness or numbness in legs Musculoskeletal:  [x] Arthritis   [] Joint swelling   [] Joint pain   [] Low  back pain Hematologic:  [] Easy bruising  [] Easy bleeding   [] Hypercoagulable state   [] Anemic   Gastrointestinal:  [] Blood in stool   [] Vomiting blood  [x] Gastroesophageal reflux/heartburn   [] Abdominal pain Genitourinary:  [] Chronic kidney disease   [] Difficult urination  [] Frequent urination  [] Burning with urination   [] Hematuria Skin:  [] Rashes   [] Ulcers   [] Wounds Psychological:  [x] History of anxiety   []  History of major depression.  Physical Examination  BP 138/71 (BP Location: Right Arm)   Pulse 96   Resp 20   Ht 5\' 7"  (1.702 m)   Wt 142 lb 9.6 oz (64.7 kg)   BMI 22.33 kg/m  Gen:  WD/WN, NAD Head: [place]/AT, No temporalis wasting. Ear/Nose/Throat: Hearing grossly intact, nares w/o erythema or drainage  Eyes: Conjunctiva clear. Sclera non-icteric Neck: Supple.  Trachea midline Pulmonary:  Good air movement, no use of accessory muscles.  Cardiac: RRR, no JVD Vascular:  Vessel Right Left  Radial Palpable Palpable                          PT Not Palpable Palpable  DP 1+ Palpable Palpable   Gastrointestinal: soft, non-tender/non-distended. No guarding/reflex.  Musculoskeletal: M/S 5/5 throughout.  No deformity or atrophy. 1-2+ RLE edema. Neurologic: Sensation grossly intact in extremities.  Symmetrical.  Speech is fluent.  Psychiatric: Judgment intact, Mood & affect appropriate for pt's clinical situation. Dermatologic: No rashes or ulcers noted.  No cellulitis or open wounds.       Labs Recent Results (from the past 2160 hour(s))  BUN     Status: None   Collection Time: 08/18/18 10:00 AM  Result Value Ref Range   BUN 13 8 - 23 mg/dL    Comment: Performed at [facility], [address] [place], [place] [address]  Creatinine, serum     Status: None   Collection Time: 08/18/18 10:00 AM  Result Value Ref Range   Creatinine, Ser 0.76 0.44 - 1.00 mg/dL   GFR calc non Af Amer >60 >60 mL/min   GFR calc Af Amer >60 >60 mL/min    Comment: Performed at  [facility], [address] [place], [place] [address]  Glucose, capillary     Status: Abnormal   Collection Time: 08/21/18  7:07 AM  Result Value Ref Range   Glucose-Capillary 214 (H) 70 - 99 mg/dL  Glucose, capillary     Status: Abnormal   Collection Time: 08/21/18  9:37 AM  Result Value Ref Range   Glucose-Capillary 227 (H) 70 - 99 mg/dL  Novel Coronavirus, NAA (hospital order; send-out to ref lab)     Status: None   Collection Time: 08/25/18 12:48 PM  Result Value Ref Range   SARS-CoV-2, NAA NOT DETECTED NOT DETECTED    Comment: (NOTE) This test was developed and its performance characteristics determined by [employer]. This test has not been FDA cleared or approved. This test has been authorized by FDA under an Emergency Use Authorization (EUA). This test is only authorized for the duration of time the declaration that circumstances exist justifying the authorization of the emergency use of in vitro diagnostic tests for detection of SARS-CoV-2 virus and/or diagnosis of COVID-19 infection under section 564(b)(1) of the Act, 21 U.S.C. 941DEY-8(X)(4), unless the authorization is terminated or revoked sooner. When diagnostic testing is negative, the possibility of a false negative result should be considered in the context of a patient's recent exposures and the presence of clinical signs and symptoms consistent with COVID-19. An individual without symptoms of COVID-19 and who is not shedding SARS-CoV-2 virus would expect to have a negative (not detected) result in this assay. Performed  At: [facility] [address] [place], [place] [id number] Jolene Schimke MD HF:[id number]    Coronavirus Source NASOPHARYNGEAL     Comment: Performed at [facility], [address] [place], [place] [address]  BUN     Status: None   Collection Time: 08/25/18  1:52 PM  Result Value Ref Range   BUN 14 8 - 23 mg/dL    Comment: Performed at [facility], [address] [place], [place] [address]  Creatinine, serum     Status: None   Collection Time: 08/25/18  1:52 PM  Result Value Ref Range   Creatinine, Ser 0.81 0.44 - 1.00 mg/dL   GFR calc non Af Amer >60 >60 mL/min   GFR calc Af Amer >60 >60 mL/min    Comment: Performed at [facility], [address] [place], [place] [address]  Glucose, capillary     Status: Abnormal   Collection Time: 08/28/18  7:31 AM  Result Value Ref Range   Glucose-Capillary 184 (H) 70 - 99 mg/dL  Glucose, capillary     Status: Abnormal   Collection Time: 08/28/18  9:56 AM  Result Value Ref Range   Glucose-Capillary 157 (H) 70 - 99 mg/dL  Basic metabolic panel     Status: Abnormal   Collection Time: 08/29/18  3:29 AM  Result Value Ref Range   Sodium 138 135 - 145 mmol/L   Potassium 3.2 (L) 3.5 - 5.1 mmol/L   Chloride 105 98 - 111 mmol/L   CO2 23 22 - 32 mmol/L   Glucose, Bld 222 (H) 70 - 99 mg/dL   BUN 9 8 - 23 mg/dL   Creatinine, Ser 6.040.70 0.44 - 1.00 mg/dL   Calcium 8.5 (L) 8.9 - 10.3 mg/dL   GFR calc non Af Amer >60 >60 mL/min   GFR calc Af Amer >60 >60 mL/min   Anion gap 10 5 - 15    Comment: Performed at [facility], [address] [place], [place] [address]  CBC     Status: Abnormal   Collection Time: 08/29/18  3:29 AM  Result Value Ref Range   WBC 14.1 (H) 4.0 - 10.5 K/uL   RBC 2.94 (L) 3.87 - 5.11 MIL/uL   Hemoglobin 8.5 (L) 12.0 - 15.0 g/dL   HCT 11.924.5 (L) 14.736.0 - 82.946.0 %   MCV 83.3 80.0 - 100.0 fL   MCH 28.9 26.0 - 34.0 pg   MCHC 34.7 30.0 - 36.0 g/dL   RDW 56.213.1 13.011.5 - 86.515.5 %   Platelets 308 150 - 400 K/uL   nRBC 0.0 0.0 - 0.2 %    Comment: Performed at [facility], [address] [place], [place] [address]  Basic metabolic panel     Status: Abnormal   Collection Time: 08/30/18  3:09 AM  Result Value Ref Range   Sodium 140 135 - 145 mmol/L   Potassium 3.4 (L) 3.5 - 5.1 mmol/L   Chloride 109 98 - 111 mmol/L   CO2 22 22 - 32 mmol/L    Glucose, Bld 200 (H) 70 - 99 mg/dL   BUN 6 (L) 8 - 23 mg/dL   Creatinine, Ser 6.290.71 0.44 - 1.00 mg/dL   Calcium 8.6 (L) 8.9 - 10.3 mg/dL   GFR calc non Af Amer >60 >60 mL/min   GFR calc Af Amer >60 >60 mL/min   Anion gap 9 5 - 15    Comment: Performed at [facility], [address] [place], [place] [address]  CBC     Status: Abnormal   Collection Time: 08/30/18  3:09 AM  Result Value Ref Range   WBC 13.8 (H) 4.0 - 10.5 K/uL   RBC 2.82 (L) 3.87 - 5.11 MIL/uL   Hemoglobin 8.2 (L) 12.0 - 15.0 g/dL   HCT 32.423.5 (L) 40.136.0 - 02.746.0 %   MCV 83.3 80.0 - 100.0 fL   MCH 29.1 26.0 - 34.0 pg   MCHC 34.9 30.0 - 36.0 g/dL   RDW 25.313.1 66.411.5 - 40.315.5 %   Platelets 334 150 - 400 K/uL  nRBC 0.0 0.0 - 0.2 %    Comment: Performed at [facility], [address] [place], [place] [address]    Radiology No results found.  Assessment/Plan  HTN (hypertension) blood pressure control important in reducing the progression of atherosclerotic disease. On appropriate oral medications.   Diabetes (HCC) blood glucose control important in reducing the progression of atherosclerotic disease. Also, involved in wound healing. On appropriate medications.   PAD (peripheral artery disease) (HCC) Symptoms are markedly improved after intervention.  The patient is congratulated on being tobacco free for 3 weeks now.  Clearly, this will be her best option to keep this open long-term.  She will remain on anticoagulation.  I will see her back in about a month.  She can elevate her legs and use compression stocking as needed for the swelling.    Festus BarrenJason Tegh Franek, MD  09/19/2018 11:11 AM    This note was created with Dragon medical transcription system.  Any errors from dictation are purely unintentional

## 2018-09-19 NOTE — Assessment & Plan Note (Signed)
Symptoms are markedly improved after intervention.  The patient is congratulated on being tobacco free for 3 weeks now.  Clearly, this will be her best option to keep this open long-term.  She will remain on anticoagulation.  I will see her back in about a month.  She can elevate her legs and use compression stocking as needed for the swelling.

## 2018-09-19 NOTE — Assessment & Plan Note (Signed)
blood pressure control important in reducing the progression of atherosclerotic disease. On appropriate oral medications.  

## 2018-10-02 ENCOUNTER — Telehealth (INDEPENDENT_AMBULATORY_CARE_PROVIDER_SITE_OTHER): Payer: Self-pay | Admitting: Nurse Practitioner

## 2018-10-02 NOTE — Telephone Encounter (Signed)
Patient left message on our voicemail asking for her call to be returned about Eliquis. I have attempted to contact patient at [phone] number she left and it say that the network is busy and I am unable to leave a voicemail. AS, CMA

## 2018-10-05 ENCOUNTER — Telehealth (INDEPENDENT_AMBULATORY_CARE_PROVIDER_SITE_OTHER): Payer: Self-pay | Admitting: Nurse Practitioner

## 2018-10-05 NOTE — Telephone Encounter (Signed)
If she is unable to afford eliquis she can go on coumadin, however this would require frequent blood test until she is able to get her INR to a stable level.  This also carries dietary restrictions.  We could transition her to ASA and plavix, however given the fact that she has developed clots after her procedure it wouldn't offer her the best protection and she runs the risk of clotting again.

## 2018-10-05 NOTE — Telephone Encounter (Signed)
She has started the 'hardship' request with [facility] to see if they can lower the cost for her. AS, CMA

## 2018-10-05 NOTE — Telephone Encounter (Signed)
Patient called stating Eliquis rx costing [id number] a month and that she cant afford to pay for that. Patient does not have any insurance. Patient bought 1 week supply and it cost her $106 for that. She is requesting coupons. I am going to give her a free 30day supply coupon for now but patient may need RX switched to something she can afford.  Please advise. AS, CMA

## 2018-10-06 ENCOUNTER — Telehealth (INDEPENDENT_AMBULATORY_CARE_PROVIDER_SITE_OTHER): Payer: Self-pay | Admitting: Nurse Practitioner

## 2018-10-06 ENCOUNTER — Other Ambulatory Visit (INDEPENDENT_AMBULATORY_CARE_PROVIDER_SITE_OTHER): Payer: Self-pay | Admitting: Nurse Practitioner

## 2018-10-06 DIAGNOSIS — I739 Peripheral vascular disease, unspecified: Secondary | ICD-10-CM

## 2018-10-06 NOTE — Telephone Encounter (Signed)
Before I send her Rx in, please call her and speak to her about the labs that are necessary as well education about food and med interaction.  Once we get the preliminary labs, I will send the coumadin in.  We will need to recheck 3 days after the initial dose. Also be sure to let her know that we will be checking frequent labs until we get to stable INR levels.

## 2018-10-06 NOTE — Telephone Encounter (Signed)
Can you send in Coumadin to pharmacy for patient please. AS, CMA

## 2018-10-06 NOTE — Telephone Encounter (Signed)
No we will just have adjust dosing but it wont cause any major issues.

## 2018-10-06 NOTE — Telephone Encounter (Signed)
Patient had called back and left message on nurse line. I attempted to call her back with no answer and left her a message. AS, CMA

## 2018-10-06 NOTE — Telephone Encounter (Signed)
Attempted to call patient again. Unable to reach patient. Left another message and advised patient our office is closed at [place] and will not re-open until Monday at 8am. AS, CMA

## 2018-10-06 NOTE — Telephone Encounter (Signed)
Patient was given the 30day free trial coupon for Eliquis but it did not work because shes already used it. She needs a cheaper anticoagulant.   She has applied for the 'hardship' for Eliquis and is waiting to find out about that.  Patient asked if she starts Coumadin and then gets approved for Eliquis and switches back will that cause any issues?

## 2018-10-06 NOTE — Telephone Encounter (Signed)
Left message for patient to return my call. AS, CMA 

## 2018-10-09 ENCOUNTER — Other Ambulatory Visit
Admission: RE | Admit: 2018-10-09 | Discharge: 2018-10-09 | Disposition: A | Discharge status: Home / Self Care | Payer: Medicaid Other | Source: Ambulatory Visit | Attending: Nurse Practitioner | Admitting: Nurse Practitioner

## 2018-10-09 ENCOUNTER — Other Ambulatory Visit (INDEPENDENT_AMBULATORY_CARE_PROVIDER_SITE_OTHER): Payer: Self-pay | Admitting: Nurse Practitioner

## 2018-10-09 ENCOUNTER — Telehealth (INDEPENDENT_AMBULATORY_CARE_PROVIDER_SITE_OTHER): Payer: Self-pay | Admitting: Nurse Practitioner

## 2018-10-09 DIAGNOSIS — I739 Peripheral vascular disease, unspecified: Secondary | ICD-10-CM

## 2018-10-09 LAB — CBC WITH DIFFERENTIAL/PLATELET
Abs Immature Granulocytes: 0.03 10*3/uL (ref 0.00–0.07)
Basophils Absolute: 0.1 10*3/uL (ref 0.0–0.1)
Basophils Relative: 1 %
Eosinophils Absolute: 0.2 10*3/uL (ref 0.0–0.5)
Eosinophils Relative: 3 %
HCT: 27.4 % — ABNORMAL LOW (ref 36.0–46.0)
Hemoglobin: 9.2 g/dL — ABNORMAL LOW (ref 12.0–15.0)
Immature Granulocytes: 0 %
Lymphocytes Relative: 28 %
Lymphs Abs: 2.1 10*3/uL (ref 0.7–4.0)
MCH: 28.4 pg (ref 26.0–34.0)
MCHC: 33.6 g/dL (ref 30.0–36.0)
MCV: 84.6 fL (ref 80.0–100.0)
Monocytes Absolute: 0.5 10*3/uL (ref 0.1–1.0)
Monocytes Relative: 6 %
Neutro Abs: 4.6 10*3/uL (ref 1.7–7.7)
Neutrophils Relative %: 62 %
Platelets: 317 10*3/uL (ref 150–400)
RBC: 3.24 MIL/uL — ABNORMAL LOW (ref 3.87–5.11)
RDW: 14.8 % (ref 11.5–15.5)
WBC: 7.4 10*3/uL (ref 4.0–10.5)
nRBC: 0 % (ref 0.0–0.2)

## 2018-10-09 LAB — PROTIME-INR
INR: 1.1 (ref 0.8–1.2)
Prothrombin Time: 14.3 seconds (ref 11.4–15.2)

## 2018-10-09 MED ORDER — WARFARIN SODIUM 5 MG PO TABS: 5.0000 mg | ORAL_TABLET | Freq: Every day | ORAL | 5 refills | Status: DC

## 2018-10-09 NOTE — Telephone Encounter (Signed)
Patient has been made aware of the below and verbalized understanding with no questions. She is going to lab for bloodwork. AS, CMA

## 2018-10-09 NOTE — Telephone Encounter (Signed)
Patient uses [facility].  Patient instructed to take coumadin- 1 tab Monday - Wednesday and have labs drawn Thursday and we will call with new instructions for coumadin. Patient verbalized understanding. AS, CMA

## 2018-10-12 ENCOUNTER — Telehealth (INDEPENDENT_AMBULATORY_CARE_PROVIDER_SITE_OTHER): Payer: Self-pay | Admitting: Nurse Practitioner

## 2018-10-12 ENCOUNTER — Encounter (INDEPENDENT_AMBULATORY_CARE_PROVIDER_SITE_OTHER): Payer: Self-pay | Admitting: Nurse Practitioner

## 2018-10-12 ENCOUNTER — Other Ambulatory Visit (INDEPENDENT_AMBULATORY_CARE_PROVIDER_SITE_OTHER): Payer: Self-pay | Admitting: Nurse Practitioner

## 2018-10-12 ENCOUNTER — Other Ambulatory Visit: Payer: Self-pay

## 2018-10-12 ENCOUNTER — Ambulatory Visit (INDEPENDENT_AMBULATORY_CARE_PROVIDER_SITE_OTHER): Payer: Medicaid Other

## 2018-10-12 ENCOUNTER — Other Ambulatory Visit
Admission: RE | Admit: 2018-10-12 | Discharge: 2018-10-12 | Disposition: A | Discharge status: Home / Self Care | Payer: Medicaid Other | Source: Ambulatory Visit | Attending: Nurse Practitioner | Admitting: Nurse Practitioner

## 2018-10-12 ENCOUNTER — Ambulatory Visit (INDEPENDENT_AMBULATORY_CARE_PROVIDER_SITE_OTHER): Payer: Medicaid Other | Admitting: Nurse Practitioner

## 2018-10-12 VITALS — BP 167/84 | HR 106 | Resp 10 | Ht 67.0 in | Wt 143.0 lb

## 2018-10-12 DIAGNOSIS — Z95820 Peripheral vascular angioplasty status with implants and grafts: Secondary | ICD-10-CM

## 2018-10-12 DIAGNOSIS — R29898 Other symptoms and signs involving the musculoskeletal system: Secondary | ICD-10-CM

## 2018-10-12 DIAGNOSIS — I739 Peripheral vascular disease, unspecified: Secondary | ICD-10-CM | POA: Insufficient documentation

## 2018-10-12 DIAGNOSIS — Z87891 Personal history of nicotine dependence: Secondary | ICD-10-CM

## 2018-10-12 DIAGNOSIS — I10 Essential (primary) hypertension: Secondary | ICD-10-CM

## 2018-10-12 DIAGNOSIS — Z9889 Other specified postprocedural states: Secondary | ICD-10-CM

## 2018-10-12 DIAGNOSIS — E1151 Type 2 diabetes mellitus with diabetic peripheral angiopathy without gangrene: Secondary | ICD-10-CM | POA: Diagnosis not present

## 2018-10-12 DIAGNOSIS — Z79899 Other long term (current) drug therapy: Secondary | ICD-10-CM

## 2018-10-12 LAB — PROTIME-INR
INR: 1.2 (ref 0.8–1.2)
Prothrombin Time: 15.5 seconds — ABNORMAL HIGH (ref 11.4–15.2)

## 2018-10-12 NOTE — Progress Notes (Signed)
Let's make sure the patient has been taking her medication daily, because the INR hasn't moved much.  If she has, let's have her begin taking 1.5 ( one and a half tablets) and recheck her on Monday/Tuesday.

## 2018-10-12 NOTE — Telephone Encounter (Signed)
Patient is complaining of her R leg feeling like its going to give out. Patient just started Coumadin this past week. She wants to know if she should be concerned. AS, CMA

## 2018-10-12 NOTE — Telephone Encounter (Signed)
Can you please contact the patient to schedule as advised below. Thank you,   Patient is not currently smoking.  AS, CMA

## 2018-10-12 NOTE — Telephone Encounter (Signed)
Let's have her come in for an ABI today or tomorrow.  Me or Dew. If we have to get her an ultrasound only and then work her in if there is something looks to be wrong.  Also, see if the patient has restarted smoking.

## 2018-10-16 ENCOUNTER — Other Ambulatory Visit: Payer: Self-pay

## 2018-10-16 ENCOUNTER — Other Ambulatory Visit (INDEPENDENT_AMBULATORY_CARE_PROVIDER_SITE_OTHER): Payer: Self-pay | Admitting: Nurse Practitioner

## 2018-10-16 ENCOUNTER — Telehealth (INDEPENDENT_AMBULATORY_CARE_PROVIDER_SITE_OTHER): Payer: Self-pay

## 2018-10-16 ENCOUNTER — Other Ambulatory Visit
Admission: RE | Admit: 2018-10-16 | Discharge: 2018-10-16 | Disposition: A | Discharge status: Home / Self Care | Payer: Medicaid Other | Attending: Nurse Practitioner | Admitting: Nurse Practitioner

## 2018-10-16 DIAGNOSIS — I739 Peripheral vascular disease, unspecified: Secondary | ICD-10-CM | POA: Insufficient documentation

## 2018-10-16 LAB — PROTIME-INR
INR: 2.3 — ABNORMAL HIGH (ref 0.8–1.2)
Prothrombin Time: 24.8 seconds — ABNORMAL HIGH (ref 11.4–15.2)

## 2018-10-16 NOTE — Progress Notes (Signed)
Patient INR is 2.3, which is in the 2-3 range we want.  She should take 1.5 pills on Monday and Tuesday and then 1 pill on the other days.  Let's recheck on monday

## 2018-10-16 NOTE — Progress Notes (Signed)
Patient has been inform with Sylvia Ditch NP medical instructions

## 2018-10-18 ENCOUNTER — Encounter (INDEPENDENT_AMBULATORY_CARE_PROVIDER_SITE_OTHER): Payer: Self-pay | Admitting: Nurse Practitioner

## 2018-10-18 NOTE — Progress Notes (Signed)
SUBJECTIVE:  Patient ID: Sylvia [name], female    DOB: 10-27-1955, 63 y.o.   MRN: [id number] Chief Complaint  Patient presents with  . Follow-up    HPI  Sylvia [name] is a 63 y.o. female that presents today with right leg weakness.  The patient recently underwent thrombectomy of her right lower extremity on 08/28/2018.  Since that time the patient states that her right lower extremity has been a little weak side.  The patient was initially placed on Eliquis following the procedure however due to lack of insurance she was unable to for continued coverage.  She has recently been transitioned to Coumadin however she is not yet therapeutic.  The patient was concerned due to what she perceived as worsening weakness.  She denies any fever, chills, nausea, vomiting or diarrhea.  She denies any cold lower extremities.  She denies any lower extremity ulceration.  Today her right ABI 0.86 and her left is 0.87.  This is compared to the previous study on 07/14/2018 which had the right and 0.60 on the left is 0.70.  The right lower extremity has hyperemic waveforms.  However it was noted that the stent within the right SFA/popliteal system is patent.  No hemodynamically significant stenosis found.  There is also a known right anterior tibial artery occlusion that was found on angiogram on 08/21/2018.  The left lower extremity is also hyperemic waveforms with monophasic waveforms within the left posterior tibial artery.  Past Medical History:  Diagnosis Date  . Anxiety    h/o  . COPD (chronic obstructive pulmonary disease) ([place])   . Diabetes mellitus without complication ([place])   . GERD (gastroesophageal reflux disease)   . Hypertension    bp under control-off meds since 2019    Past Surgical History:  Procedure Laterality Date  . ABDOMINAL HYSTERECTOMY    . CHOLECYSTECTOMY    . ESOPHAGOGASTRODUODENOSCOPY (EGD) WITH PROPOFOL N/A 06/15/2018   Procedure: ESOPHAGOGASTRODUODENOSCOPY (EGD) WITH  PROPOFOL;  Surgeon: Toledo, Benay Pike, MD;  Location: [facility] ENDOSCOPY;  Service: Gastroenterology;  Laterality: N/A;  . LOWER EXTREMITY ANGIOGRAPHY Right 08/21/2018   Procedure: LOWER EXTREMITY ANGIOGRAPHY;  Surgeon: Algernon Huxley, MD;  Location: [place] [facility];  Service: Cardiovascular;  Laterality: Right;  . LOWER EXTREMITY ANGIOGRAPHY Left 08/28/2018   Procedure: LOWER EXTREMITY ANGIOGRAPHY;  Surgeon: Algernon Huxley, MD;  Location: [place] [facility];  Service: Cardiovascular;  Laterality: Left;  . LOWER EXTREMITY ANGIOGRAPHY Right 08/28/2018   Procedure: Lower Extremity Angiography;  Surgeon: Algernon Huxley, MD;  Location: [place] [facility];  Service: Cardiovascular;  Laterality: Right;    Social History   Socioeconomic History  . Marital status: Divorced    Spouse name: Not on file  . Number of children: Not on file  . Years of education: Not on file  . Highest education level: Not on file  Occupational History  . Not on file  Social Needs  . Financial resource strain: Not hard at all  . Food insecurity    Worry: Sometimes true    Inability: Sometimes true  . Transportation needs    Medical: No    Non-medical: No  Tobacco Use  . Smoking status: Former Smoker    Packs/day: 0.25    Years: 45.00    Pack years: 11.25    Types: Cigarettes  . Smokeless tobacco: Never Used  . Tobacco comment: down to 1 cig daily  Substance and Sexual Activity  . Alcohol use: No  Frequency: Never  . Drug use: Never  . Sexual activity: Not Currently  Lifestyle  . Physical activity    Days per week: 4 days    Minutes per session: 20 min  . Stress: Only a little  Relationships  . Social Musician on phone: Patient refused    Gets together: Patient refused    Attends religious service: Patient refused    Active member of club or organization: Patient refused    Attends meetings of clubs or organizations: Patient refused    Relationship status: Patient refused  . Intimate  partner violence    Fear of current or ex partner: Patient refused    Emotionally abused: Patient refused    Physically abused: Patient refused    Forced sexual activity: Patient refused  Other Topics Concern  . Not on file  Social History Narrative  . Not on file    Family History  Problem Relation Age of Onset  . Breast cancer Mother 50    Allergies  Allergen Reactions  . Penicillins Hives    Has patient had a PCN reaction causing immediate rash, facial/tongue/throat swelling, SOB or lightheadedness with hypotension: Yes Has patient had a PCN reaction causing severe rash involving mucus membranes or skin necrosis: No Has patient had a PCN reaction that required hospitalization: No Has patient had a PCN reaction occurring within the last 10 years: No If all of the above answers are "NO", then may proceed with Cephalosporin use.     Review of Systems   Review of Systems: Negative Unless Checked Constitutional: [] Weight loss  [] Fever  [] Chills Cardiac: [] Chest pain   []  Atrial Fibrillation  [] Palpitations   [] Shortness of breath when laying flat   [] Shortness of breath with exertion. [] Shortness of breath at rest Vascular:  [] Pain in legs with walking   [] Pain in legs with standing [] Pain in legs when laying flat   [x] Claudication    [] Pain in feet when laying flat    [] History of DVT   [] Phlebitis   [] Swelling in legs   [] Varicose veins   [] Non-healing ulcers Pulmonary:   [] Uses home oxygen   [] Productive cough   [] Hemoptysis   [] Wheeze  [x] COPD   [] Asthma Neurologic:  [] Dizziness   [] Seizures  [] Blackouts [] History of stroke   [] History of TIA  [] Aphasia   [] Temporary Blindness   [] Weakness or numbness in arm   [x] Weakness or numbness in leg Musculoskeletal:   [] Joint swelling   [] Joint pain   [] Low back pain  []  History of Knee Replacement [] Arthritis [] back Surgeries  []  Spinal Stenosis    Hematologic:  [] Easy bruising  [] Easy bleeding   [] Hypercoagulable state    [] Anemic Gastrointestinal:  [] Diarrhea   [] Vomiting  [] Gastroesophageal reflux/heartburn   [] Difficulty swallowing. [] Abdominal pain Genitourinary:  [] Chronic kidney disease   [] Difficult urination  [] Anuric   [] Blood in urine [] Frequent urination  [] Burning with urination   [] Hematuria Skin:  [] Rashes   [] Ulcers [] Wounds Psychological:  [] History of anxiety   []  History of major depression  []  Memory Difficulties      OBJECTIVE:   Physical Exam  BP (!) 167/84 (BP Location: Left Arm, Patient Position: Sitting, Cuff Size: Normal)   Pulse (!) 106   Resp 10   Ht 5\' 7"  (1.702 m)   Wt 143 lb (64.9 kg)   BMI 22.40 kg/m   Gen: WD/WN, NAD Head: [place]/AT, No temporalis wasting.  Ear/Nose/Throat: Hearing grossly intact, nares w/o erythema or drainage  Eyes: PER, EOMI, sclera nonicteric.  Neck: Supple, no masses.  No JVD.  Pulmonary:  Good air movement, no use of accessory muscles.  Cardiac: RRR Vascular:  Vessel Right Left  Radial Palpable Palpable  Dorsalis Pedis  not palpable  not palpable  Posterior Tibial  not palpable  not palpable   Gastrointestinal: soft, non-distended. No guarding/no peritoneal signs.  Musculoskeletal: M/S 5/5 throughout.  No deformity or atrophy.  Neurologic: Pain and light touch intact in extremities.  Symmetrical.  Speech is fluent. Motor exam as listed above. Psychiatric: Judgment intact, Mood & affect appropriate for pt's clinical situation. Dermatologic: No Venous rashes. No Ulcers Noted.  No changes consistent with cellulitis. Lymph : No Cervical lymphadenopathy, no lichenification or skin changes of chronic lymphedema.       ASSESSMENT AND PLAN:  1. PAD (peripheral artery disease) (HCC) Based on the patient's noninvasive studies it is not likely that the weakness is resulting from her peripheral artery disease.  The patient also does endorse a history of lower back issues so this could be a possible culprit.  Spent extensive amount of time talking to  the patient about Coumadin to reinforce previous education.  Patient understands that Coumadin must be highly monitored in order to achieve a safe therapeutic range.  Also discussed the patient that if the range is too low then she does not gain any benefit and runs the risk of rethrombosis, however if it is too high she runs a risk of bleeding.  Patient understands and will take her medicine as prescribed as well as follow-up on necessary lab visits.  We will see the patient return in 3 months to repeat noninvasive studies. - VAS [place] ABI WITH/WO TBI; Future - VAS [place] LOWER EXTREMITY ARTERIAL DUPLEX; Future  2. Essential hypertension Continue antihypertensive medications as already ordered, these medications have been reviewed and there are no changes at this time.   3. Type 2 diabetes mellitus with diabetic peripheral angiopathy without gangrene, unspecified whether long term insulin use (HCC) Continue hypoglycemic medications as already ordered, these medications have been reviewed and there are no changes at this time.  Hgb A1C to be monitored as already arranged by primary service    Current Outpatient Medications on File Prior to Visit  Medication Sig Dispense Refill  . albuterol (PROVENTIL HFA;VENTOLIN HFA) 108 (90 Base) MCG/ACT inhaler Inhale 2 puffs into the lungs every 6 (six) hours as needed for wheezing or shortness of breath. 1 Inhaler 2  . alum & mag hydroxide-simeth (MAALOX/MYLANTA) 200-200-20 MG/5ML suspension Take 30 mLs by mouth every 4 (four) hours as needed for indigestion or heartburn. 355 mL 0  . aspirin EC 81 MG tablet Take 1 tablet (81 mg total) by mouth daily. (Patient not taking: Reported on 09/19/2018) 90 tablet 3  . atorvastatin (LIPITOR) 10 MG tablet Take 1 tablet (10 mg total) by mouth daily. 90 tablet 3  . Biotin 1000 MCG tablet Take 1,000 tablets by mouth daily.     . cetirizine (ZYRTEC) 10 MG tablet Take 10 mg by mouth daily.    . cholecalciferol (VITAMIN D3) 25  MCG (1000 UT) tablet Take 1,000 Units by mouth daily.    Marland Kitchen HYDROcodone-acetaminophen (NORCO/VICODIN) 5-325 MG tablet Take 1-2 tablets by mouth every 4 (four) hours as needed for moderate pain. (Patient not taking: Reported on 09/19/2018) 30 tablet 0  . iron polysaccharides (NIFEREX) 150 MG capsule Take 1 capsule (150 mg total) by mouth daily. 30 capsule 3  . Multiple Vitamins-Minerals (  WOMENS MULTIVITAMIN PO) Take 1 tablet by mouth daily.    . pantoprazole (PROTONIX) 40 MG tablet Take 1 tablet (40 mg total) by mouth daily. 30 tablet 0  . ranitidine (ZANTAC) 150 MG tablet Take 150 mg by mouth every morning.     . sitaGLIPtin (JANUVIA) 100 MG tablet Take 100 mg by mouth daily.    . varenicline (CHANTIX CONTINUING MONTH PAK) 1 MG tablet Take 1 tablet (1 mg total) by mouth 2 (two) times daily. (Patient not taking: Reported on 09/19/2018) 60 tablet 2  . varenicline (CHANTIX) 0.5 MG tablet Take 1 tablet (0.5 mg total) by mouth 2 (two) times daily. (Patient not taking: Reported on 09/19/2018) 14 tablet 0  . vitamin C (ASCORBIC ACID) 500 MG tablet Take 500 mg by mouth daily.    Marland Kitchen. warfarin (COUMADIN) 5 MG tablet Take 1 tablet (5 mg total) by mouth daily at 6 PM. 30 tablet 5   No current facility-administered medications on file prior to visit.     There are no Patient Instructions on file for this visit. No follow-ups on file.   Georgiana SpinnerFallon E Rigby Leonhardt, NP  This note was completed with Office managerDragon Dictation.  Any errors are purely unintentional.

## 2018-10-23 ENCOUNTER — Encounter (INDEPENDENT_AMBULATORY_CARE_PROVIDER_SITE_OTHER): Payer: MEDICAID

## 2018-10-23 ENCOUNTER — Other Ambulatory Visit
Admission: RE | Admit: 2018-10-23 | Discharge: 2018-10-23 | Disposition: A | Discharge status: Home / Self Care | Payer: Medicaid Other | Source: Ambulatory Visit | Attending: Nurse Practitioner | Admitting: Nurse Practitioner

## 2018-10-23 ENCOUNTER — Ambulatory Visit (INDEPENDENT_AMBULATORY_CARE_PROVIDER_SITE_OTHER): Payer: MEDICAID | Admitting: Nurse Practitioner

## 2018-10-23 ENCOUNTER — Other Ambulatory Visit: Payer: Self-pay

## 2018-10-23 DIAGNOSIS — I739 Peripheral vascular disease, unspecified: Secondary | ICD-10-CM | POA: Insufficient documentation

## 2018-10-23 LAB — PROTIME-INR
INR: 2.5 — ABNORMAL HIGH (ref 0.8–1.2)
Prothrombin Time: 26.3 seconds — ABNORMAL HIGH (ref 11.4–15.2)

## 2018-10-23 NOTE — Progress Notes (Signed)
I spoke with the patient and she verbalize understanding of Sylvia Ditch NP medical instructions

## 2018-10-23 NOTE — Progress Notes (Signed)
Her INR is within range at 2.5.   She should continue to take 1.5 pills on Monday and Tuesday and then 1 pill on the other days.  Let's recheck on Monday

## 2018-10-30 ENCOUNTER — Other Ambulatory Visit: Payer: Self-pay

## 2018-10-30 ENCOUNTER — Other Ambulatory Visit
Admission: RE | Admit: 2018-10-30 | Discharge: 2018-10-30 | Disposition: A | Discharge status: Home / Self Care | Payer: Medicaid Other | Source: Ambulatory Visit | Attending: Nurse Practitioner | Admitting: Nurse Practitioner

## 2018-10-30 DIAGNOSIS — I739 Peripheral vascular disease, unspecified: Secondary | ICD-10-CM | POA: Diagnosis not present

## 2018-10-30 LAB — PROTIME-INR
INR: 2.3 — ABNORMAL HIGH (ref 0.8–1.2)
Prothrombin Time: 24.8 seconds — ABNORMAL HIGH (ref 11.4–15.2)

## 2018-10-30 NOTE — Progress Notes (Signed)
Patient has been made aware with information and verbalize understanding 

## 2018-10-30 NOTE — Progress Notes (Signed)
INR is 2.3, which is again where we want. She should continue to take 1.5 pills on Monday and Tuesday and then 1 pill on the other days.  Let's recheck in two weeks since we've been consistent for a little while.  11/13/2018 should be the next check

## 2018-11-11 ENCOUNTER — Emergency Department
Admission: EM | Admit: 2018-11-11 | Discharge: 2018-11-11 | Disposition: A | Discharge status: Home / Self Care | Payer: Medicaid Other | Attending: Emergency Medicine | Admitting: Emergency Medicine

## 2018-11-11 ENCOUNTER — Other Ambulatory Visit: Payer: Self-pay

## 2018-11-11 ENCOUNTER — Emergency Department: Payer: Medicaid Other

## 2018-11-11 DIAGNOSIS — Z7901 Long term (current) use of anticoagulants: Secondary | ICD-10-CM | POA: Insufficient documentation

## 2018-11-11 DIAGNOSIS — M79674 Pain in right toe(s): Secondary | ICD-10-CM | POA: Diagnosis present

## 2018-11-11 DIAGNOSIS — Z7982 Long term (current) use of aspirin: Secondary | ICD-10-CM | POA: Insufficient documentation

## 2018-11-11 DIAGNOSIS — I10 Essential (primary) hypertension: Secondary | ICD-10-CM | POA: Insufficient documentation

## 2018-11-11 DIAGNOSIS — E119 Type 2 diabetes mellitus without complications: Secondary | ICD-10-CM | POA: Insufficient documentation

## 2018-11-11 DIAGNOSIS — J449 Chronic obstructive pulmonary disease, unspecified: Secondary | ICD-10-CM | POA: Insufficient documentation

## 2018-11-11 DIAGNOSIS — L03031 Cellulitis of right toe: Secondary | ICD-10-CM | POA: Diagnosis not present

## 2018-11-11 DIAGNOSIS — Z87891 Personal history of nicotine dependence: Secondary | ICD-10-CM | POA: Diagnosis not present

## 2018-11-11 MED ORDER — HYDROCODONE-ACETAMINOPHEN 5-325 MG PO TABS: 1.0000 | ORAL_TABLET | Freq: Four times a day (QID) | ORAL | 0 refills | Status: DC | PRN

## 2018-11-11 MED ORDER — SULFAMETHOXAZOLE-TRIMETHOPRIM 800-160 MG PO TABS: 1.0000 | ORAL_TABLET | Freq: Two times a day (BID) | ORAL | 0 refills | Status: DC

## 2018-11-11 NOTE — Discharge Instructions (Signed)
Please make sure you tell your PCP that you are on Bactrim. He/She may want to monitor your coumadin level more frequently over the next couple of weeks. Make sure you keep your appointment on Monday.  Return to the ER for symptoms that change or worsen if unable to schedule an appointment.

## 2018-11-11 NOTE — ED Notes (Signed)
Patient transported to X-ray 

## 2018-11-11 NOTE — ED Provider Notes (Signed)
[facility] Emergency Department Provider Note  ____________________________________________  Time seen: Approximately 1:30 PM  I have reviewed the triage vital signs and the nursing notes.   HISTORY  Chief Complaint Toe Pain   HPI Sylvia [name] is a 63 y.o. female who presents to the emergency department for evaluation of right small toe pain. She stubbed it on a table leg about a month ago and it isn't healing well. She is diabetic. No improvement with peroxide.   Past Medical History:  Diagnosis Date  . Anxiety    h/o  . COPD (chronic obstructive pulmonary disease) ([place])   . Diabetes mellitus without complication ([place])   . GERD (gastroesophageal reflux disease)   . Hypertension    bp under control-off meds since 2019    Patient Active Problem List   Diagnosis Date Noted  . Atherosclerosis of artery of extremity with rest pain ([place]) 08/28/2018  . PAD (peripheral artery disease) ([place]) 07/14/2018  . Abdominal pain 06/13/2018  . HTN (hypertension) 06/13/2018  . Diabetes ([place]) 06/13/2018  . Elevated lactic acid level 06/13/2018    Past Surgical History:  Procedure Laterality Date  . ABDOMINAL HYSTERECTOMY    . CHOLECYSTECTOMY    . ESOPHAGOGASTRODUODENOSCOPY (EGD) WITH PROPOFOL N/A 06/15/2018   Procedure: ESOPHAGOGASTRODUODENOSCOPY (EGD) WITH PROPOFOL;  Surgeon: Toledo, Benay Pike, MD;  Location: [facility];  Service: Gastroenterology;  Laterality: N/A;  . LOWER EXTREMITY ANGIOGRAPHY Right 08/21/2018   Procedure: LOWER EXTREMITY ANGIOGRAPHY;  Surgeon: Algernon Huxley, MD;  Location: [place] [facility];  Service: Cardiovascular;  Laterality: Right;  . LOWER EXTREMITY ANGIOGRAPHY Left 08/28/2018   Procedure: LOWER EXTREMITY ANGIOGRAPHY;  Surgeon: Algernon Huxley, MD;  Location: [place] [facility];  Service: Cardiovascular;  Laterality: Left;  . LOWER EXTREMITY ANGIOGRAPHY Right 08/28/2018   Procedure: Lower Extremity Angiography;  Surgeon: Algernon Huxley, MD;  Location: [place] [facility];  Service: Cardiovascular;  Laterality: Right;    Prior to Admission medications   Medication Sig Start Date End Date Taking? Authorizing Provider  albuterol (PROVENTIL HFA;VENTOLIN HFA) 108 (90 Base) MCG/ACT inhaler Inhale 2 puffs into the lungs every 6 (six) hours as needed for wheezing or shortness of breath. 08/20/17   Schuyler Amor, MD  alum & mag hydroxide-simeth (MAALOX/MYLANTA) 200-200-20 MG/5ML suspension Take 30 mLs by mouth every 4 (four) hours as needed for indigestion or heartburn. 06/16/18   Stark Jock Jude, MD  aspirin EC 81 MG tablet Take 1 tablet (81 mg total) by mouth daily. Patient not taking: Reported on 09/19/2018 08/30/18   Algernon Huxley, MD  atorvastatin (LIPITOR) 10 MG tablet Take 1 tablet (10 mg total) by mouth daily. 08/21/18 08/21/19  Stegmayer, Janalyn Harder, PA-C  Biotin 1000 MCG tablet Take 1,000 tablets by mouth daily.     [provider]  cetirizine (ZYRTEC) 10 MG tablet Take 10 mg by mouth daily.    [provider]  cholecalciferol (VITAMIN D3) 25 MCG (1000 UT) tablet Take 1,000 Units by mouth daily.    [provider]  HYDROcodone-acetaminophen (NORCO/VICODIN) 5-325 MG tablet Take 1 tablet by mouth every 6 (six) hours as needed for moderate pain. 11/11/18 11/11/19  Reyden Smith, Johnette Abraham B, FNP  iron polysaccharides (NIFEREX) 150 MG capsule Take 1 capsule (150 mg total) by mouth daily. 08/30/18   Algernon Huxley, MD  Multiple Vitamins-Minerals (WOMENS MULTIVITAMIN PO) Take 1 tablet by mouth daily.    [provider]  pantoprazole (PROTONIX) 40 MG tablet Take 1 tablet (  40 mg total) by mouth daily. 06/17/18   Enid Baas Jude, MD  ranitidine (ZANTAC) 150 MG tablet Take 150 mg by mouth every morning.     [provider]  sitaGLIPtin (JANUVIA) 100 MG tablet Take 100 mg by mouth daily.    [provider]  sulfamethoxazole-trimethoprim (BACTRIM DS) 800-160 MG tablet Take 1 tablet by mouth 2 (two) times daily.  11/11/18   Ji Feldner, Rulon Eisenmenger B, FNP  varenicline (CHANTIX CONTINUING MONTH PAK) 1 MG tablet Take 1 tablet (1 mg total) by mouth 2 (two) times daily. Patient not taking: Reported on 09/19/2018 08/30/18   Annice Needy, MD  varenicline (CHANTIX) 0.5 MG tablet Take 1 tablet (0.5 mg total) by mouth 2 (two) times daily. Patient not taking: Reported on 09/19/2018 08/30/18   Annice Needy, MD  vitamin C (ASCORBIC ACID) 500 MG tablet Take 500 mg by mouth daily.    [provider]  warfarin (COUMADIN) 5 MG tablet Take 1 tablet (5 mg total) by mouth daily at 6 PM. 10/09/18 04/07/19  Georgiana Spinner, NP    Allergies Penicillins  Family History  Problem Relation Age of Onset  . Breast cancer Mother 83    Social History Social History   Tobacco Use  . Smoking status: Former Smoker    Packs/day: 0.25    Years: 45.00    Pack years: 11.25    Types: Cigarettes  . Smokeless tobacco: Never Used  . Tobacco comment: down to 1 cig daily  Substance Use Topics  . Alcohol use: No    Frequency: Never  . Drug use: Never    Review of Systems  Constitutional: Negative for fever. Respiratory: Negative for cough or shortness of breath.  Musculoskeletal: Negative for myalgias Skin: Positive for wound on right small toe. Neurological: Positive for neuropathy of the feet. ____________________________________________   PHYSICAL EXAM:  VITAL SIGNS: ED Triage Vitals  Enc Vitals Group     BP 11/11/18 1259 122/79     Pulse Rate 11/11/18 1259 96     Resp 11/11/18 1259 18     Temp 11/11/18 1259 98.6 F (37 C)     Temp Source 11/11/18 1259 Oral     SpO2 11/11/18 1259 100 %     Weight 11/11/18 1259 145 lb (65.8 kg)     Height 11/11/18 1259 5\' 7"  (1.702 m)     Head Circumference --      Peak Flow --      Pain Score 11/11/18 1311 9     Pain Loc --      Pain Edu? --      Excl. in GC? --      Constitutional: Overall well appearing. Eyes: Conjunctivae are clear without discharge or drainage. Nose:  No rhinorrhea noted. Mouth/Throat: Airway is patent.  Neck: No stridor. Unrestricted range of motion observed. Cardiovascular: Capillary refill is <3 seconds.  Respiratory: Respirations are even and unlabored.. Musculoskeletal: Unrestricted range of motion observed. Neurologic: Awake, alert, and oriented x 4.  Skin:  Old wound to the skin fold under the nail of the right small toe.  ____________________________________________   LABS (all labs ordered are listed, but only abnormal results are displayed)  Labs Reviewed - No data to display ____________________________________________  EKG  Not indicated. ____________________________________________  RADIOLOGY  Soft tissue swelling without acute bony abnormality. ____________________________________________   PROCEDURES  Procedures ____________________________________________   INITIAL IMPRESSION / ASSESSMENT AND PLAN / ED COURSE  Amiee Shipes is a 63 y.o. female  who presents to the ER for evaluation of right small toe/foot pain after injury a month ago. She was directed by her PCP to come to the ER for evaluation. She states that her blood sugars "run about 260."   Patient discharged home on Bactrim. She was made aware that she will need to follow up with her PCP as scheduled on Monday. She is to tell him/her that she is on antibiotic so that the coumadin level can be monitored more closely. She states that her level has been lower than desired over the past 2 weeks and has a repeat test on Monday.    Medications - No data to display   Pertinent labs & imaging results that were available during my care of the patient were reviewed by me and considered in my medical decision making (see chart for details).  ____________________________________________   FINAL CLINICAL IMPRESSION(S) / ED DIAGNOSES  Final diagnoses:  Cellulitis of toe of right foot    ED Discharge Orders         Ordered     HYDROcodone-acetaminophen (NORCO/VICODIN) 5-325 MG tablet  Every 6 hours PRN     11/11/18 1425    sulfamethoxazole-trimethoprim (BACTRIM DS) 800-160 MG tablet  2 times daily     11/11/18 1425           Note:  This document was prepared using Dragon voice recognition software and may include unintentional dictation errors.   Chinita Pester, FNP 11/11/18 1527    Jeanmarie Plant, MD 11/11/18 [phone]

## 2018-11-11 NOTE — ED Triage Notes (Signed)
Pt arrived via POV with reports of right pinky toe pain, pt states it has been bothering her for 1 month, states she bumped it on a table, states she has seen pus and is painful

## 2018-11-13 ENCOUNTER — Other Ambulatory Visit
Admission: RE | Admit: 2018-11-13 | Discharge: 2018-11-13 | Disposition: A | Discharge status: Home / Self Care | Payer: Medicaid Other | Source: Ambulatory Visit | Attending: Nurse Practitioner | Admitting: Nurse Practitioner

## 2018-11-13 DIAGNOSIS — I739 Peripheral vascular disease, unspecified: Secondary | ICD-10-CM | POA: Insufficient documentation

## 2018-11-13 LAB — PROTIME-INR
INR: 2.9 — ABNORMAL HIGH (ref 0.8–1.2)
Prothrombin Time: 29.7 seconds — ABNORMAL HIGH (ref 11.4–15.2)

## 2018-11-13 NOTE — Progress Notes (Signed)
She's borderline on the high side but still within range. Let's have her continue but check in 2 weeks.

## 2018-11-22 ENCOUNTER — Emergency Department
Admission: EM | Admit: 2018-11-22 | Discharge: 2018-11-22 | Disposition: A | Discharge status: Home / Self Care | Payer: Medicaid Other | Attending: Emergency Medicine | Admitting: Emergency Medicine

## 2018-11-22 ENCOUNTER — Encounter: Payer: Self-pay | Admitting: Emergency Medicine

## 2018-11-22 ENCOUNTER — Emergency Department: Payer: Medicaid Other

## 2018-11-22 ENCOUNTER — Other Ambulatory Visit: Payer: Self-pay

## 2018-11-22 DIAGNOSIS — Z7982 Long term (current) use of aspirin: Secondary | ICD-10-CM | POA: Diagnosis not present

## 2018-11-22 DIAGNOSIS — Z7984 Long term (current) use of oral hypoglycemic drugs: Secondary | ICD-10-CM | POA: Diagnosis not present

## 2018-11-22 DIAGNOSIS — Z79899 Other long term (current) drug therapy: Secondary | ICD-10-CM | POA: Insufficient documentation

## 2018-11-22 DIAGNOSIS — R000 Tachycardia, unspecified: Secondary | ICD-10-CM | POA: Diagnosis not present

## 2018-11-22 DIAGNOSIS — I10 Essential (primary) hypertension: Secondary | ICD-10-CM | POA: Insufficient documentation

## 2018-11-22 DIAGNOSIS — Z87891 Personal history of nicotine dependence: Secondary | ICD-10-CM | POA: Diagnosis not present

## 2018-11-22 DIAGNOSIS — M79674 Pain in right toe(s): Secondary | ICD-10-CM | POA: Diagnosis not present

## 2018-11-22 DIAGNOSIS — E1151 Type 2 diabetes mellitus with diabetic peripheral angiopathy without gangrene: Secondary | ICD-10-CM | POA: Insufficient documentation

## 2018-11-22 DIAGNOSIS — J449 Chronic obstructive pulmonary disease, unspecified: Secondary | ICD-10-CM | POA: Diagnosis not present

## 2018-11-22 NOTE — ED Provider Notes (Signed)
[facility] Emergency Department Provider Note  ____________________________________________  Time seen: Approximately 8:40 PM  I have reviewed the triage vital signs and the nursing notes.   HISTORY  Chief Complaint Toe Pain    HPI Kiaria Quinnell is a 63 y.o. female presents to the emergency department with a painful right fifth toe that has occurred chronically since May 11.  Patient has suffered multiple right foot contusions that have been in part complicated by right fifth partial toenail avulsion.  Patient states that toenail is painful to the touch.  Patient states that she recently completed a course of Bactrim and has not been under the care of podiatry.  Patient states that she hit right fifth toe on a car tire tonight became concerned for injury.  No alleviating medications were attempted.         Past Medical History:  Diagnosis Date  . Anxiety    h/o  . COPD (chronic obstructive pulmonary disease) ([place])   . Diabetes mellitus without complication ([place])   . GERD (gastroesophageal reflux disease)   . Hypertension    bp under control-off meds since 2019    Patient Active Problem List   Diagnosis Date Noted  . Atherosclerosis of artery of extremity with rest pain ([place]) 08/28/2018  . PAD (peripheral artery disease) ([employer]) 07/14/2018  . Abdominal pain 06/13/2018  . HTN (hypertension) 06/13/2018  . Diabetes ([place]) 06/13/2018  . Elevated lactic acid level 06/13/2018    Past Surgical History:  Procedure Laterality Date  . ABDOMINAL HYSTERECTOMY    . CHOLECYSTECTOMY    . ESOPHAGOGASTRODUODENOSCOPY (EGD) WITH PROPOFOL N/A 06/15/2018   Procedure: ESOPHAGOGASTRODUODENOSCOPY (EGD) WITH PROPOFOL;  Surgeon: Toledo, Benay Pike, MD;  Location: [facility] ENDOSCOPY;  Service: Gastroenterology;  Laterality: N/A;  . LOWER EXTREMITY ANGIOGRAPHY Right 08/21/2018   Procedure: LOWER EXTREMITY ANGIOGRAPHY;  Surgeon: Algernon Huxley, MD;  Location: [facility];   Service: Cardiovascular;  Laterality: Right;  . LOWER EXTREMITY ANGIOGRAPHY Left 08/28/2018   Procedure: LOWER EXTREMITY ANGIOGRAPHY;  Surgeon: Algernon Huxley, MD;  Location: [facility];  Service: Cardiovascular;  Laterality: Left;  . LOWER EXTREMITY ANGIOGRAPHY Right 08/28/2018   Procedure: Lower Extremity Angiography;  Surgeon: Algernon Huxley, MD;  Location: [facility];  Service: Cardiovascular;  Laterality: Right;    Prior to Admission medications   Medication Sig Start Date End Date Taking? Authorizing Provider  albuterol (PROVENTIL HFA;VENTOLIN HFA) 108 (90 Base) MCG/ACT inhaler Inhale 2 puffs into the lungs every 6 (six) hours as needed for wheezing or shortness of breath. 08/20/17   Schuyler Amor, MD  alum & mag hydroxide-simeth (MAALOX/MYLANTA) 200-200-20 MG/5ML suspension Take 30 mLs by mouth every 4 (four) hours as needed for indigestion or heartburn. 06/16/18   Stark Jock Jude, MD  aspirin EC 81 MG tablet Take 1 tablet (81 mg total) by mouth daily. Patient not taking: Reported on 09/19/2018 08/30/18   Algernon Huxley, MD  atorvastatin (LIPITOR) 10 MG tablet Take 1 tablet (10 mg total) by mouth daily. 08/21/18 08/21/19  Stegmayer, Janalyn Harder, PA-C  Biotin 1000 MCG tablet Take 1,000 tablets by mouth daily.     [provider]  cetirizine (ZYRTEC) 10 MG tablet Take 10 mg by mouth daily.    [provider]  cholecalciferol (VITAMIN D3) 25 MCG (1000 UT) tablet Take 1,000 Units by mouth daily.    [provider]  HYDROcodone-acetaminophen (NORCO/VICODIN) 5-325 MG tablet Take 1 tablet by mouth every 6 (six)  hours as needed for moderate pain. 11/11/18 11/11/19  Triplett, Rulon Eisenmenger B, FNP  iron polysaccharides (NIFEREX) 150 MG capsule Take 1 capsule (150 mg total) by mouth daily. 08/30/18   Annice Needy, MD  Multiple Vitamins-Minerals (WOMENS MULTIVITAMIN PO) Take 1 tablet by mouth daily.    [provider]  pantoprazole (PROTONIX) 40 MG tablet Take 1 tablet (40 mg  total) by mouth daily. 06/17/18   Enid Baas Jude, MD  ranitidine (ZANTAC) 150 MG tablet Take 150 mg by mouth every morning.     [provider]  sitaGLIPtin (JANUVIA) 100 MG tablet Take 100 mg by mouth daily.    [provider]  sulfamethoxazole-trimethoprim (BACTRIM DS) 800-160 MG tablet Take 1 tablet by mouth 2 (two) times daily. 11/11/18   Triplett, Rulon Eisenmenger B, FNP  varenicline (CHANTIX CONTINUING MONTH PAK) 1 MG tablet Take 1 tablet (1 mg total) by mouth 2 (two) times daily. Patient not taking: Reported on 09/19/2018 08/30/18   Annice Needy, MD  varenicline (CHANTIX) 0.5 MG tablet Take 1 tablet (0.5 mg total) by mouth 2 (two) times daily. Patient not taking: Reported on 09/19/2018 08/30/18   Annice Needy, MD  vitamin C (ASCORBIC ACID) 500 MG tablet Take 500 mg by mouth daily.    [provider]  warfarin (COUMADIN) 5 MG tablet Take 1 tablet (5 mg total) by mouth daily at 6 PM. 10/09/18 04/07/19  Georgiana Spinner, NP    Allergies Penicillins  Family History  Problem Relation Age of Onset  . Breast cancer Mother 71    Social History Social History   Tobacco Use  . Smoking status: Former Smoker    Packs/day: 0.25    Years: 45.00    Pack years: 11.25    Types: Cigarettes  . Smokeless tobacco: Never Used  . Tobacco comment: down to 1 cig daily  Substance Use Topics  . Alcohol use: No    Frequency: Never  . Drug use: Never     Review of Systems  Constitutional: No fever/chills Eyes: No visual changes. No discharge ENT: No upper respiratory complaints. Cardiovascular: no chest pain. Respiratory: no cough. No SOB. Gastrointestinal: No abdominal pain.  No nausea, no vomiting.  No diarrhea.  No constipation. Musculoskeletal: Patient has right fifth toe pain.  Skin: Negative for rash, abrasions, lacerations, ecchymosis. Neurological: Negative for headaches, focal weakness or numbness.   ____________________________________________   PHYSICAL EXAM:  VITAL  SIGNS: ED Triage Vitals  Enc Vitals Group     BP 11/22/18 1925 139/74     Pulse Rate 11/22/18 1925 (!) 112     Resp 11/22/18 1925 18     Temp --      Temp Source 11/22/18 1925 Oral     SpO2 11/22/18 1925 98 %     Weight 11/22/18 1926 145 lb (65.8 kg)     Height 11/22/18 1926 5\' 7"  (1.702 m)     Head Circumference --      Peak Flow --      Pain Score --      Pain Loc --      Pain Edu? --      Excl. in GC? --      Constitutional: Alert and oriented. Well appearing and in no acute distress. Eyes: Conjunctivae are normal. PERRL. EOMI. Head: Atraumatic. Cardiovascular: Normal rate, regular rhythm. Normal S1 and S2.  Good peripheral circulation. Respiratory: Normal respiratory effort without tachypnea or retractions. Lungs CTAB. Good air entry to the bases  with no decreased or absent breath sounds. Musculoskeletal: Full range of motion to all extremities. No gross deformities appreciated. Neurologic:  Normal speech and language. No gross focal neurologic deficits are appreciated.  Skin: Patient's right fifth toenail is hypertrophied and partially avulsed. Psychiatric: Mood and affect are normal. Speech and behavior are normal. Patient exhibits appropriate insight and judgement.   ____________________________________________   LABS (all labs ordered are listed, but only abnormal results are displayed)  Labs Reviewed - No data to display ____________________________________________  EKG   ____________________________________________  RADIOLOGY I personally viewed and evaluated these images as part of my medical decision making, as well as reviewing the written report by the radiologist.  Dg Toe 5th Right  Result Date: 11/22/2018 CLINICAL DATA:  Right toe pain after blunt trauma today. Previous injury on 08/28/2018. EXAM: RIGHT FIFTH TOE COMPARISON:  Radiographs dated 11/11/2018 FINDINGS: There is no evidence of fracture or dislocation. There is no evidence of arthropathy or  other focal bone abnormality. Soft tissue swelling of the toe. The nail appears partially avulsed. IMPRESSION: No acute osseous abnormality. No radiographic evidence of osteomyelitis. Partial avulsion of the nail. Electronically Signed   By: Francene BoyersJames  Maxwell M.D.   On: 11/22/2018 20:00    ____________________________________________    PROCEDURES  Procedure(s) performed:    Procedures    Medications - No data to display   ____________________________________________   INITIAL IMPRESSION / ASSESSMENT AND PLAN / ED COURSE  Pertinent labs & imaging results that were available during my care of the patient were reviewed by me and considered in my medical decision making (see chart for details).  Review of the [place] CSRS was performed in accordance of the [facility] prior to dispensing any controlled drugs.         Assessment and plan Right fifth toe pain 63 year old female presents to the emergency department with acute on chronic right fifth toe pain  Patient was mildly tachycardic at triage but vital signs were otherwise stable.  Patient was able to move right fifth toe.  Patient had tenderness to palpation over right fifth toenail which is partially avulsed.  Patient was referred to podiatry for toenail removal.  She voiced understanding.  Patient was not requesting additional pain medications during this emergency department encounter.  All patient questions were answered.   ____________________________________________  FINAL CLINICAL IMPRESSION(S) / ED DIAGNOSES  Final diagnoses:  Pain of toe of right foot      NEW MEDICATIONS STARTED DURING THIS VISIT:  ED Discharge Orders    None          This chart was dictated using voice recognition software/Dragon. Despite best efforts to proofread, errors can occur which can change the meaning. Any change was purely unintentional.    Orvil FeilWoods, Marlana Mckowen M, PA-C 11/22/18 2044    Sharyn CreamerQuale, Mark, MD 12/01/18 1026

## 2018-11-22 NOTE — ED Triage Notes (Signed)
Pt presetns to ED with painful 5th toe on right foot. Pt reports hitting her toe on a table leg on May 11 and pt had continued pain so she was seen by this ED on 7/25 and treated with antibiotics for possible infection. Pt states she completed her medications but toe has never improved and today she hit it again on a tire of her car. Now experiencing worsening pain. Pt alert and calm at this time with no acute distress noted.

## 2018-11-22 NOTE — ED Notes (Signed)
Pt to the er for pain to the 5th digit of the right foot. Pt has a hx of injury to that toe that had to be treated with antibiotics.Pt is a diabetic. No bleeding or swelling noted to the toe. Pt came as a precaution.

## 2018-11-27 ENCOUNTER — Other Ambulatory Visit
Admission: RE | Admit: 2018-11-27 | Discharge: 2018-11-27 | Disposition: A | Discharge status: Home / Self Care | Payer: Medicaid Other | Source: Ambulatory Visit | Attending: Nurse Practitioner | Admitting: Nurse Practitioner

## 2018-11-27 DIAGNOSIS — I739 Peripheral vascular disease, unspecified: Secondary | ICD-10-CM | POA: Diagnosis not present

## 2018-11-27 LAB — PROTIME-INR
INR: 1.9 — ABNORMAL HIGH (ref 0.8–1.2)
Prothrombin Time: 21.7 seconds — ABNORMAL HIGH (ref 11.4–15.2)

## 2018-11-27 NOTE — Progress Notes (Signed)
Can we call Sylvia [name] and see if she's missed any doses, started any new medication and how she is taking her medication.  She had dropped quite a bit in two weeks.  She isn't too far from normal but I just need to see if we need to adjust things

## 2018-11-27 NOTE — Progress Notes (Signed)
Please remind the patient that green leafy vegetables affect her Levels....have her continue to take the same dose, but eat a little less green leafy foods.

## 2018-11-28 ENCOUNTER — Telehealth (INDEPENDENT_AMBULATORY_CARE_PROVIDER_SITE_OTHER): Payer: Self-pay | Admitting: Nurse Practitioner

## 2018-11-28 NOTE — Telephone Encounter (Signed)
Patient called stating she has been having frequent nose bleeds for the past 2 weeks. Sometimes nose bleeds are heavy and take around 15 min to clot. Other times its just a light bleed. Patient is taking Coumadin.    I spoke with Miguel Aschoff advised that due to patient vascular standpoint patient needs to continue to take anticoagulant. Patient can try using saline nasal spray to keep nostrils moist and also be sure not to scratch inside of the nose with nails. If patient should have copious nose bleed then go to the ED.   Patient has been made aware of the above and verbalized understanding. AS, CMA

## 2018-11-29 ENCOUNTER — Telehealth (INDEPENDENT_AMBULATORY_CARE_PROVIDER_SITE_OTHER): Payer: Self-pay | Admitting: Nurse Practitioner

## 2018-11-29 NOTE — Telephone Encounter (Signed)
Patient called office today stating she has been having black stools since she started taking the Coumadin. Patient is concerned she may be bleeding.   Spoke with Arna Medici- She advised patient d/c Coumadin for now and contact PCP or go to the Urgent Care to have stool tested for blood to determine if patient has a true gi bleed. If patient does have gi bleed we will need to refer patient for a gi consult.   Patient has been made aware that due to stopping the anticoagulant she is at an increase rate for DVT. Patient told to go to the ED should she have severe leg pain. Patient verbalized understanding.   Patient is going to UC now for stool test and will call with results. AS, CMA

## 2018-11-29 NOTE — Telephone Encounter (Signed)
Patient called back and stated that she had reached out to her PCP instead of going to UC. Per pt her PCP is looking into sending her for a colonoscopy. I spoke with Arna Medici and she advised this was fine but to remind patient to seek care at ED should she start with leg pain since she has d/c plavix. Patient verbalized understanding. AS, CMA

## 2018-11-30 ENCOUNTER — Telehealth (INDEPENDENT_AMBULATORY_CARE_PROVIDER_SITE_OTHER): Payer: Self-pay

## 2018-11-30 NOTE — Telephone Encounter (Signed)
Patient left a message stating that she has her stool kit and will be taking it tomorrow

## 2018-12-13 ENCOUNTER — Telehealth (INDEPENDENT_AMBULATORY_CARE_PROVIDER_SITE_OTHER): Payer: Self-pay | Admitting: Nurse Practitioner

## 2018-12-13 NOTE — Telephone Encounter (Signed)
Left message for patient to call our office back. AS< CMA

## 2018-12-13 NOTE — Telephone Encounter (Signed)
Well, Sylvia [name] truly needs to take the stool test.  This is due to the fact that stopping coumadin without any other anticoagulation in place, especially since she has an ischemic episode in her right leg before, places the patient at great risk for another ischemic event.  Also, because it has been 2 weeks since she stopped the coumadin and hasn't tested her stool, we may have missed the window of opportunity to discover if she was truly bleeding or if the discoloration in her stool was due to something she ate.  As far as the lump in her right leg, it is unlikely that it is related to lack of blood flow in her right leg, as it typically doesn't manifest as a painful lump.  This may be a varicose vein, which is generally benign.  If the patient wishes, we can try to move up her 01/16/2019 appointment with the studies associated to see if there changes with her arterial status.

## 2018-12-13 NOTE — Telephone Encounter (Signed)
Patient has not taken the stool test. She said she didn't want to play in her stool. AS, CMA

## 2018-12-13 NOTE — Telephone Encounter (Signed)
Lump in R leg close to the ankle-painful, just noticed today. Patient d/c coumain x 2 weeks. Please advise. AS, CMA

## 2018-12-14 ENCOUNTER — Emergency Department: Payer: Medicaid Other

## 2018-12-14 ENCOUNTER — Other Ambulatory Visit: Payer: Self-pay

## 2018-12-14 ENCOUNTER — Inpatient Hospital Stay
Admission: EM | Admit: 2018-12-14 | Discharge: 2019-01-06 | DRG: 616 | Disposition: A | Discharge status: Home Health | Payer: Medicaid Other | Attending: Internal Medicine | Admitting: Internal Medicine

## 2018-12-14 DIAGNOSIS — Z87891 Personal history of nicotine dependence: Secondary | ICD-10-CM

## 2018-12-14 DIAGNOSIS — Z7901 Long term (current) use of anticoagulants: Secondary | ICD-10-CM

## 2018-12-14 DIAGNOSIS — Z9049 Acquired absence of other specified parts of digestive tract: Secondary | ICD-10-CM

## 2018-12-14 DIAGNOSIS — Z79899 Other long term (current) drug therapy: Secondary | ICD-10-CM

## 2018-12-14 DIAGNOSIS — E1110 Type 2 diabetes mellitus with ketoacidosis without coma: Secondary | ICD-10-CM | POA: Diagnosis present

## 2018-12-14 DIAGNOSIS — J449 Chronic obstructive pulmonary disease, unspecified: Secondary | ICD-10-CM | POA: Diagnosis present

## 2018-12-14 DIAGNOSIS — K5903 Drug induced constipation: Secondary | ICD-10-CM | POA: Diagnosis not present

## 2018-12-14 DIAGNOSIS — Z9114 Patient's other noncompliance with medication regimen: Secondary | ICD-10-CM

## 2018-12-14 DIAGNOSIS — K529 Noninfective gastroenteritis and colitis, unspecified: Secondary | ICD-10-CM | POA: Diagnosis present

## 2018-12-14 DIAGNOSIS — I82409 Acute embolism and thrombosis of unspecified deep veins of unspecified lower extremity: Secondary | ICD-10-CM

## 2018-12-14 DIAGNOSIS — M7989 Other specified soft tissue disorders: Secondary | ICD-10-CM

## 2018-12-14 DIAGNOSIS — K254 Chronic or unspecified gastric ulcer with hemorrhage: Secondary | ICD-10-CM | POA: Diagnosis present

## 2018-12-14 DIAGNOSIS — E1121 Type 2 diabetes mellitus with diabetic nephropathy: Secondary | ICD-10-CM | POA: Diagnosis present

## 2018-12-14 DIAGNOSIS — E876 Hypokalemia: Secondary | ICD-10-CM | POA: Diagnosis not present

## 2018-12-14 DIAGNOSIS — I998 Other disorder of circulatory system: Secondary | ICD-10-CM | POA: Diagnosis present

## 2018-12-14 DIAGNOSIS — T373X5A Adverse effect of other antiprotozoal drugs, initial encounter: Secondary | ICD-10-CM | POA: Diagnosis present

## 2018-12-14 DIAGNOSIS — Z90710 Acquired absence of both cervix and uterus: Secondary | ICD-10-CM

## 2018-12-14 DIAGNOSIS — I10 Essential (primary) hypertension: Secondary | ICD-10-CM | POA: Diagnosis present

## 2018-12-14 DIAGNOSIS — K921 Melena: Secondary | ICD-10-CM

## 2018-12-14 DIAGNOSIS — I70223 Atherosclerosis of native arteries of extremities with rest pain, bilateral legs: Secondary | ICD-10-CM | POA: Diagnosis not present

## 2018-12-14 DIAGNOSIS — M6282 Rhabdomyolysis: Secondary | ICD-10-CM | POA: Diagnosis not present

## 2018-12-14 DIAGNOSIS — D62 Acute posthemorrhagic anemia: Secondary | ICD-10-CM | POA: Diagnosis not present

## 2018-12-14 DIAGNOSIS — Z86718 Personal history of other venous thrombosis and embolism: Secondary | ICD-10-CM | POA: Diagnosis not present

## 2018-12-14 DIAGNOSIS — E785 Hyperlipidemia, unspecified: Secondary | ICD-10-CM | POA: Diagnosis present

## 2018-12-14 DIAGNOSIS — Z20828 Contact with and (suspected) exposure to other viral communicable diseases: Secondary | ICD-10-CM | POA: Diagnosis present

## 2018-12-14 DIAGNOSIS — E1152 Type 2 diabetes mellitus with diabetic peripheral angiopathy with gangrene: Secondary | ICD-10-CM | POA: Diagnosis present

## 2018-12-14 DIAGNOSIS — I70229 Atherosclerosis of native arteries of extremities with rest pain, unspecified extremity: Secondary | ICD-10-CM

## 2018-12-14 DIAGNOSIS — T40605A Adverse effect of unspecified narcotics, initial encounter: Secondary | ICD-10-CM | POA: Diagnosis not present

## 2018-12-14 DIAGNOSIS — Z95820 Peripheral vascular angioplasty status with implants and grafts: Secondary | ICD-10-CM | POA: Diagnosis not present

## 2018-12-14 DIAGNOSIS — N141 Nephropathy induced by other drugs, medicaments and biological substances: Secondary | ICD-10-CM | POA: Diagnosis not present

## 2018-12-14 DIAGNOSIS — K5900 Constipation, unspecified: Secondary | ICD-10-CM

## 2018-12-14 DIAGNOSIS — R1084 Generalized abdominal pain: Secondary | ICD-10-CM

## 2018-12-14 DIAGNOSIS — I70261 Atherosclerosis of native arteries of extremities with gangrene, right leg: Secondary | ICD-10-CM | POA: Diagnosis not present

## 2018-12-14 DIAGNOSIS — K269 Duodenal ulcer, unspecified as acute or chronic, without hemorrhage or perforation: Secondary | ICD-10-CM

## 2018-12-14 DIAGNOSIS — K219 Gastro-esophageal reflux disease without esophagitis: Secondary | ICD-10-CM | POA: Diagnosis present

## 2018-12-14 DIAGNOSIS — I743 Embolism and thrombosis of arteries of the lower extremities: Secondary | ICD-10-CM | POA: Diagnosis not present

## 2018-12-14 DIAGNOSIS — N179 Acute kidney failure, unspecified: Secondary | ICD-10-CM | POA: Diagnosis not present

## 2018-12-14 DIAGNOSIS — I70221 Atherosclerosis of native arteries of extremities with rest pain, right leg: Secondary | ICD-10-CM | POA: Diagnosis not present

## 2018-12-14 DIAGNOSIS — R791 Abnormal coagulation profile: Secondary | ICD-10-CM | POA: Diagnosis not present

## 2018-12-14 DIAGNOSIS — I739 Peripheral vascular disease, unspecified: Secondary | ICD-10-CM

## 2018-12-14 DIAGNOSIS — T508X5A Adverse effect of diagnostic agents, initial encounter: Secondary | ICD-10-CM | POA: Diagnosis not present

## 2018-12-14 DIAGNOSIS — I7409 Other arterial embolism and thrombosis of abdominal aorta: Secondary | ICD-10-CM | POA: Diagnosis not present

## 2018-12-14 LAB — SARS CORONAVIRUS 2 BY RT PCR (HOSPITAL ORDER, PERFORMED IN ~~LOC~~ HOSPITAL LAB): SARS Coronavirus 2: NEGATIVE

## 2018-12-14 LAB — BASIC METABOLIC PANEL
Anion gap: 17 — ABNORMAL HIGH (ref 5–15)
Anion gap: 9 (ref 5–15)
BUN: 14 mg/dL (ref 8–23)
BUN: 9 mg/dL (ref 8–23)
CO2: 20 mmol/L — ABNORMAL LOW (ref 22–32)
CO2: 22 mmol/L (ref 22–32)
Calcium: 10.8 mg/dL — ABNORMAL HIGH (ref 8.9–10.3)
Calcium: 8.8 mg/dL — ABNORMAL LOW (ref 8.9–10.3)
Chloride: 104 mmol/L (ref 98–111)
Chloride: 106 mmol/L (ref 98–111)
Creatinine, Ser: 1.04 mg/dL — ABNORMAL HIGH (ref 0.44–1.00)
Creatinine, Ser: 0.92 mg/dL (ref 0.44–1.00)
GFR calc Af Amer: >60 mL/min (ref >60)
GFR calc Af Amer: >60 mL/min (ref >60)
GFR calc non Af Amer: 57 mL/min — ABNORMAL LOW (ref >60)
GFR calc non Af Amer: >60 mL/min (ref >60)
Glucose, Bld: 291 mg/dL — ABNORMAL HIGH (ref 70–99)
Glucose, Bld: 418 mg/dL — ABNORMAL HIGH (ref 70–99)
Potassium: 3.8 mmol/L (ref 3.5–5.1)
Potassium: 3.2 mmol/L — ABNORMAL LOW (ref 3.5–5.1)
Sodium: 141 mmol/L (ref 135–145)
Sodium: 137 mmol/L (ref 135–145)

## 2018-12-14 LAB — HEPATIC FUNCTION PANEL
ALT: 15 U/L (ref 0–44)
AST: 17 U/L (ref 15–41)
Albumin: 4.3 g/dL (ref 3.5–5.0)
Alkaline Phosphatase: 86 U/L (ref 38–126)
Bilirubin, Direct: 0.1 mg/dL (ref 0.0–0.2)
Indirect Bilirubin: 1.1 mg/dL — ABNORMAL HIGH (ref 0.3–0.9)
Total Bilirubin: 1.2 mg/dL (ref 0.3–1.2)
Total Protein: 8.4 g/dL — ABNORMAL HIGH (ref 6.5–8.1)

## 2018-12-14 LAB — GLUCOSE, CAPILLARY
Glucose-Capillary: 113 mg/dL — ABNORMAL HIGH (ref 70–99)
Glucose-Capillary: 114 mg/dL — ABNORMAL HIGH (ref 70–99)
Glucose-Capillary: 124 mg/dL — ABNORMAL HIGH (ref 70–99)
Glucose-Capillary: 140 mg/dL — ABNORMAL HIGH (ref 70–99)
Glucose-Capillary: 189 mg/dL — ABNORMAL HIGH (ref 70–99)
Glucose-Capillary: 214 mg/dL — ABNORMAL HIGH (ref 70–99)
Glucose-Capillary: 236 mg/dL — ABNORMAL HIGH (ref 70–99)
Glucose-Capillary: 247 mg/dL — ABNORMAL HIGH (ref 70–99)

## 2018-12-14 LAB — CBC WITH DIFFERENTIAL/PLATELET
Abs Immature Granulocytes: 0.09 10*3/uL — ABNORMAL HIGH (ref 0.00–0.07)
Basophils Absolute: 0.1 10*3/uL (ref 0.0–0.1)
Basophils Relative: 0 %
Eosinophils Absolute: 0 10*3/uL (ref 0.0–0.5)
Eosinophils Relative: 0 %
HCT: 32.1 % — ABNORMAL LOW (ref 36.0–46.0)
Hemoglobin: 11.1 g/dL — ABNORMAL LOW (ref 12.0–15.0)
Immature Granulocytes: 1 %
Lymphocytes Relative: 8 %
Lymphs Abs: 1.2 10*3/uL (ref 0.7–4.0)
MCH: 28.2 pg (ref 26.0–34.0)
MCHC: 34.6 g/dL (ref 30.0–36.0)
MCV: 81.5 fL (ref 80.0–100.0)
Monocytes Absolute: 0.5 10*3/uL (ref 0.1–1.0)
Monocytes Relative: 3 %
Neutro Abs: 13.6 10*3/uL — ABNORMAL HIGH (ref 1.7–7.7)
Neutrophils Relative %: 88 %
Platelets: 312 10*3/uL (ref 150–400)
RBC: 3.94 MIL/uL (ref 3.87–5.11)
RDW: 13.9 % (ref 11.5–15.5)
WBC: 15.4 10*3/uL — ABNORMAL HIGH (ref 4.0–10.5)
nRBC: 0 % (ref 0.0–0.2)

## 2018-12-14 LAB — BETA-HYDROXYBUTYRIC ACID: Beta-Hydroxybutyric Acid: 0.49 mmol/L — ABNORMAL HIGH (ref 0.05–0.27)

## 2018-12-14 LAB — BLOOD GAS, VENOUS
Patient temperature: 37
pCO2, Ven: 36 mmHg — ABNORMAL LOW (ref 44.0–60.0)
pH, Ven: 7.32 (ref 7.250–7.430)
pO2, Ven: 33 mmHg (ref 32.0–45.0)

## 2018-12-14 LAB — BRAIN NATRIURETIC PEPTIDE: B Natriuretic Peptide: 75 pg/mL (ref 0.0–100.0)

## 2018-12-14 LAB — LIPASE, BLOOD: Lipase: 24 U/L (ref 11–51)

## 2018-12-14 LAB — LACTIC ACID, PLASMA: Lactic Acid, Venous: 1.7 mmol/L (ref 0.5–1.9)

## 2018-12-14 MED ORDER — SODIUM CHLORIDE 0.9 % IV SOLN
INTRAVENOUS | Status: DC
  Administered 2018-12-15 – 2018-12-19 (×5): via INTRAVENOUS

## 2018-12-14 MED ORDER — IOHEXOL 300 MG/ML  SOLN
100.0000 mL | Freq: Once | INTRAMUSCULAR | Status: AC | PRN
  Administered 2018-12-14: 100 mL via INTRAVENOUS

## 2018-12-14 MED ORDER — DEXTROSE-NACL 5-0.45 % IV SOLN
INTRAVENOUS | Status: DC
  Administered 2018-12-14 – 2018-12-15 (×2): via INTRAVENOUS

## 2018-12-14 MED ORDER — SODIUM CHLORIDE 0.9 % IV BOLUS
1000.0000 mL | Freq: Once | INTRAVENOUS | Status: AC
  Administered 2018-12-14: 1000 mL via INTRAVENOUS

## 2018-12-14 MED ORDER — LACTATED RINGERS IV BOLUS
1000.0000 mL | Freq: Once | INTRAVENOUS | Status: AC
  Administered 2018-12-14: 1000 mL via INTRAVENOUS

## 2018-12-14 MED ORDER — MORPHINE SULFATE (PF) 4 MG/ML IV SOLN
4.0000 mg | Freq: Once | INTRAVENOUS | Status: AC
  Administered 2018-12-14: 4 mg via INTRAVENOUS
  Filled 2018-12-14: qty 1

## 2018-12-14 MED ORDER — INSULIN REGULAR(HUMAN) IN NACL 100-0.9 UT/100ML-% IV SOLN
INTRAVENOUS | Status: DC
  Administered 2018-12-14: 1.9 [IU]/h via INTRAVENOUS
  Filled 2018-12-14: qty 100

## 2018-12-14 MED ORDER — ONDANSETRON HCL 4 MG/2ML IJ SOLN
4.0000 mg | Freq: Once | INTRAMUSCULAR | Status: AC
  Administered 2018-12-14: 4 mg via INTRAVENOUS
  Filled 2018-12-14: qty 2

## 2018-12-14 MED ORDER — POTASSIUM CHLORIDE 10 MEQ/100ML IV SOLN
10.0000 meq | INTRAVENOUS | Status: AC
  Administered 2018-12-14: 10 meq via INTRAVENOUS
  Filled 2018-12-14 (×2): qty 100

## 2018-12-14 NOTE — ED Notes (Signed)
This RN introduced self to pt with Kirke Shaggy, Therapist, sports. Pt denies any needs at this time. Will continue to monitor.

## 2018-12-14 NOTE — ED Notes (Signed)
Doctor paged to to alert to Anion results. Anion = 9

## 2018-12-14 NOTE — ED Notes (Signed)
ED TO INPATIENT HANDOFF REPORT  ED Nurse Name and Phone #: Jerusalem Brownstein/kelly [phone]  S Name/Age/Gender Tona Sensingoberta Carpenito 63 y.o. female Room/Bed: ED18A/ED18A  Code Status   Code Status: Prior  Home/SNF/Other Home Patient oriented to: self, place, time and situation Is this baseline? Yes   Triage Complete: Triage complete  Chief Complaint abd pain  Triage Note Pt complains of abd pain started at 2am. Pt seems drowsy.    Allergies Allergies  Allergen Reactions  . Penicillins Hives    Has patient had a PCN reaction causing immediate rash, facial/tongue/throat swelling, SOB or lightheadedness with hypotension: Yes Has patient had a PCN reaction causing severe rash involving mucus membranes or skin necrosis: No Has patient had a PCN reaction that required hospitalization: No Has patient had a PCN reaction occurring within the last 10 years: No If all of the above answers are "NO", then may proceed with Cephalosporin use.    Level of Care/Admitting Diagnosis ED Disposition    ED Disposition Condition Comment   Admit  The patient appears reasonably stabilized for admission considering the current resources, flow, and capabilities available in the ED at this time, and I doubt any other [facility] requiring further screening and/or treatment in the ED prior to admission is  present.       B Medical/Surgery History Past Medical History:  Diagnosis Date  . Anxiety    h/o  . COPD (chronic obstructive pulmonary disease) (HCC)   . Diabetes mellitus without complication (HCC)   . GERD (gastroesophageal reflux disease)   . Hypertension    bp under control-off meds since 2019   Past Surgical History:  Procedure Laterality Date  . ABDOMINAL HYSTERECTOMY    . CHOLECYSTECTOMY    . ESOPHAGOGASTRODUODENOSCOPY (EGD) WITH PROPOFOL N/A 06/15/2018   Procedure: ESOPHAGOGASTRODUODENOSCOPY (EGD) WITH PROPOFOL;  Surgeon: Toledo, Boykin Nearingeodoro K, MD;  Location: [facility] ENDOSCOPY;  Service: Gastroenterology;   Laterality: N/A;  . LOWER EXTREMITY ANGIOGRAPHY Right 08/21/2018   Procedure: LOWER EXTREMITY ANGIOGRAPHY;  Surgeon: Annice Needyew, Jason S, MD;  Location: [facility] INVASIVE CV LAB;  Service: Cardiovascular;  Laterality: Right;  . LOWER EXTREMITY ANGIOGRAPHY Left 08/28/2018   Procedure: LOWER EXTREMITY ANGIOGRAPHY;  Surgeon: Annice Needyew, Jason S, MD;  Location: [facility] INVASIVE CV LAB;  Service: Cardiovascular;  Laterality: Left;  . LOWER EXTREMITY ANGIOGRAPHY Right 08/28/2018   Procedure: Lower Extremity Angiography;  Surgeon: Annice Needyew, Jason S, MD;  Location: [facility] INVASIVE CV LAB;  Service: Cardiovascular;  Laterality: Right;     A IV Location/Drains/Wounds Patient Lines/Drains/Airways Status   Active Line/Drains/Airways    Name:   Placement date:   Placement time:   Site:   Days:   Peripheral IV 06/15/18 Right;Lateral Forearm   06/15/18    0624    Forearm   182   Peripheral IV 08/28/18 Anterior;Proximal;Right Forearm   08/28/18    0726    Forearm   108   Peripheral IV 12/14/18 Right Hand   12/14/18    1445    Hand   less than 1   Peripheral IV 12/14/18 Right Antecubital   12/14/18    1305    Antecubital   less than 1   Sheath 08/21/18 Left Arterial;Femoral   08/21/18    0834    Arterial;Femoral   115   Sheath 08/28/18 Right Arterial;Femoral   08/28/18    0830    Arterial;Femoral   108   Sheath 08/28/18 Right Arterial;Femoral   08/28/18    0924    Arterial;Femoral  108          Intake/Output Last 24 hours No intake or output data in the 24 hours ending 12/14/18 1509  Labs/Imaging Results for orders placed or performed during the hospital encounter of 12/14/18 (from the past 48 hour(s))  Basic metabolic panel     Status: Abnormal   Collection Time: 12/14/18 12:40 PM  Result Value Ref Range   Sodium 141 135 - 145 mmol/L   Potassium 3.8 3.5 - 5.1 mmol/L   Chloride 104 98 - 111 mmol/L   CO2 20 (L) 22 - 32 mmol/L   Glucose, Bld 291 (H) 70 - 99 mg/dL   BUN 14 8 - 23 mg/dL   Creatinine, Ser 6.96 (H) 0.44 - 1.00  mg/dL   Calcium 29.5 (H) 8.9 - 10.3 mg/dL   GFR calc non Af Amer 57 (L) >60 mL/min   GFR calc Af Amer >60 >60 mL/min   Anion gap 17 (H) 5 - 15    Comment: Performed at [facility], [address] [place], [place] [address]  Hepatic function panel     Status: Abnormal   Collection Time: 12/14/18 12:40 PM  Result Value Ref Range   Total Protein 8.4 (H) 6.5 - 8.1 g/dL   Albumin 4.3 3.5 - 5.0 g/dL   AST 17 15 - 41 U/L   ALT 15 0 - 44 U/L   Alkaline Phosphatase 86 38 - 126 U/L   Total Bilirubin 1.2 0.3 - 1.2 mg/dL   Bilirubin, Direct 0.1 0.0 - 0.2 mg/dL   Indirect Bilirubin 1.1 (H) 0.3 - 0.9 mg/dL    Comment: Performed at [facility], [address] [place], [place] [address]  Lipase, blood     Status: None   Collection Time: 12/14/18 12:40 PM  Result Value Ref Range   Lipase 24 11 - 51 U/L    Comment: Performed at [facility] [address] [place], [place] [address]  CBC with Differential     Status: Abnormal   Collection Time: 12/14/18 12:40 PM  Result Value Ref Range   WBC 15.4 (H) 4.0 - 10.5 K/uL   RBC 3.94 3.87 - 5.11 MIL/uL   Hemoglobin 11.1 (L) 12.0 - 15.0 g/dL   HCT 36.6 (L) 44.0 - 34.7 %   MCV 81.5 80.0 - 100.0 fL   MCH 28.2 26.0 - 34.0 pg   MCHC 34.6 30.0 - 36.0 g/dL   RDW 42.5 95.6 - 38.7 %   Platelets 312 150 - 400 K/uL   nRBC 0.0 0.0 - 0.2 %   Neutrophils Relative % 88 %   Neutro Abs 13.6 (H) 1.7 - 7.7 K/uL   Lymphocytes Relative 8 %   Lymphs Abs 1.2 0.7 - 4.0 K/uL   Monocytes Relative 3 %   Monocytes Absolute 0.5 0.1 - 1.0 K/uL   Eosinophils Relative 0 %   Eosinophils Absolute 0.0 0.0 - 0.5 K/uL   Basophils Relative 0 %   Basophils Absolute 0.1 0.0 - 0.1 K/uL   Immature Granulocytes 1 %   Abs Immature Granulocytes 0.09 (H) 0.00 - 0.07 K/uL    Comment: Performed at [facility], [address] [place], [place] [address]  Beta-hydroxybutyric acid     Status: Abnormal   Collection Time: 12/14/18 12:40 PM  Result  Value Ref Range   Beta-Hydroxybutyric Acid 0.49 (H) 0.05 - 0.27 mmol/L    Comment: Performed at [facility], [address] [place], [place] [address]  Lactic acid, plasma     Status: None   Collection Time: 12/14/18 12:41 PM  Result Value Ref Range   Lactic Acid, Venous 1.7 0.5 - 1.9 mmol/L    Comment: Performed at [facility], [facility], [address] [place], [place] [address]  Blood gas, venous     Status: Abnormal   Collection Time: 12/14/18  2:41 PM  Result Value Ref Range   pH, Ven 7.32 7.250 - 7.430   pCO2, Ven 36 (L) 44.0 - 60.0 mmHg   pO2, Ven 33.0 32.0 - 45.0 mmHg   Patient temperature 37.0    Collection site VENOUS    Sample type VENOUS     Comment: Performed at [facility], [place], [place] [address]  Glucose, capillary     Status: Abnormal   Collection Time: 12/14/18  2:58 PM  Result Value Ref Range   Glucose-Capillary 247 (H) 70 - 99 mg/dL   Ct Abdomen Pelvis W Contrast  Result Date: 12/14/2018 CLINICAL DATA:  Abdo pain with nausea and vomiting EXAM: CT ABDOMEN AND PELVIS WITH CONTRAST TECHNIQUE: Multidetector CT imaging of the abdomen and pelvis was performed using the standard protocol following bolus administration of intravenous contrast. CONTRAST:  145mL OMNIPAQUE IOHEXOL 300 MG/ML  SOLN COMPARISON:  June 13, 2018 FINDINGS: Lower chest: There is bibasilar atelectasis. There is no lung base edema or consolidation. Hepatobiliary: There is hepatic steatosis. No focal liver lesions are evident. The gallbladder is absent. There is no appreciable biliary duct dilatation. Pancreas: There is no pancreatic mass or inflammatory focus. Spleen: No splenic lesions are evident. Adrenals/Urinary Tract: Adrenals bilaterally appear unremarkable. There is a 7 x 5 mm cyst in the periphery of the mid right kidney. There is no evident hydronephrosis on either side. There is no evident renal or ureteral calculus on either side. Urinary bladder is  midline with wall thickness within normal limits. Stomach/Bowel: Most small bowel loops are fluid-filled. There is no appreciable bowel wall or mesenteric thickening. No evident bowel obstruction. There are cecal diverticula without inflammation. The terminal ileum appears unremarkable. There is no evident free air or portal venous air. Vascular/Lymphatic: There is extensive aortic and iliac artery atherosclerosis. There is extensive plaque in the mid aorta which appears to cause subtotal occlusion at the level of the lower poles of the kidneys. Distal aorta is mildly ectatic with a maximum transverse diameter of 2.3 x 2.3 cm. No frank aneurysm. No adenopathy is evident in the abdomen or pelvis. Reproductive: Uterus is absent.  No evident pelvic mass. Other: Appendix appears unremarkable. No evident abscess or ascites in the abdomen or pelvis. Musculoskeletal: There is degenerative change in each hip joint and lower lumbar spine. There are no blastic or lytic bone lesions. There is no intramuscular or abdominal wall lesion. IMPRESSION: 1. Extensive aortoiliac atherosclerosis. Extensive plaque in particular is noted in the aorta with subtotal occlusion in the mid aorta at the level of the lower poles of the kidneys. 2. Most small bowel loops are fluid-filled. This is a finding that may be indicative of a degree of ileus or enteritis. No bowel obstruction evident. There are diverticula arising from the cecum without diverticulitis. No diverticulitis seen elsewhere. 3.  No abscess in the abdomen or pelvis.  Appendix appears normal. 4. No renal or ureteral calculi. No hydronephrosis. Urinary bladder wall thickness within normal limits. 5.  Hepatic steatosis.  Gallbladder absent. 6.  Uterus absent. Electronically Signed   By: Lowella Grip III M.D.  On: 12/14/2018 14:17    Pending Labs Unresulted Labs (From admission, onward)    Start     Ordered   12/14/18 1458  SARS Coronavirus 2 [facility](Hospital order, Performed  [facility]) Nasopharyngeal Nasopharyngeal Swab  (Symptomatic/High Risk of Exposure/Tier 1 Patients Labs with Precautions)  Once,   STAT    Question Answer Comment  Is this test for diagnosis or screening Diagnosis of ill patient   Symptomatic for COVID-19 as defined by CDC Yes   Date of Symptom Onset 12/13/2018   Hospitalized for COVID-19 Yes   Admitted to ICU for COVID-19 No   Previously tested for COVID-19 Yes   Resident in a congregate (group) care setting No   Employed in healthcare setting No   Pregnant No      12/14/18 1457   12/14/18 1238  Lactic acid, plasma  Now then every 2 hours,   STAT     12/14/18 1237          Vitals/Pain Today's Vitals   12/14/18 1234 12/14/18 1259 12/14/18 1428 12/14/18 1430  BP:    (!) 176/76  Pulse:    91  Resp:    18  Temp:      TempSrc:      SpO2:    97%  Weight: 65.8 kg     Height: 5\' 7"  (1.702 m)     PainSc:  3  Asleep     Isolation Precautions Airborne and Contact precautions  Medications Medications  insulin regular, human (MYXREDLIN) 100 units/ 100 mL infusion (1.9 Units/hr Intravenous New Bag/Given 12/14/18 1507)  dextrose 5 %-0.45 % sodium chloride infusion ( Intravenous New Bag/Given 12/14/18 1502)  potassium chloride 10 mEq in 100 mL IVPB (has no administration in time range)  sodium chloride 0.9 % bolus 1,000 mL (1,000 mLs Intravenous New Bag/Given 12/14/18 1501)    And  0.9 %  sodium chloride infusion (has no administration in time range)  lactated ringers bolus 1,000 mL (0 mLs Intravenous Paused 12/14/18 1500)  morphine 4 MG/ML injection 4 mg (4 mg Intravenous Given 12/14/18 1248)  ondansetron (ZOFRAN) injection 4 mg (4 mg Intravenous Given 12/14/18 1245)  iohexol (OMNIPAQUE) 300 MG/ML solution 100 mL (100 mLs Intravenous Contrast Given 12/14/18 1356)    Mobility walks Low fall risk   Focused Assessments DKA   R Recommendations: See Admitting Provider Note  Report given to:   Additional Notes:

## 2018-12-14 NOTE — H&P (Signed)
[employer] - [place] [facility]   PATIENT NAME: Sylvia [name]    MR#:  [id number]  DATE OF BIRTH:  04/12/1956  DATE OF ADMISSION:  12/14/2018  PRIMARY CARE PHYSICIAN: [facility]   REQUESTING/REFERRING PHYSICIAN: Chesley Noon, MD  CHIEF COMPLAINT:   Chief Complaint  Patient presents with  . Abdominal Pain   Abdominal pain, nausea and vomiting today. HISTORY OF PRESENT ILLNESS:  Sylvia [name]  is a 63 y.o. female with a known history of hypertension, diabetes, COPD, GERD and anxiety.  The patient presents the ED with above chief complaint.  Patient started to have abdominal pain, nausea, vomiting and diarrhea today.  She also found her blood sugar is high.  She denies any fever or chills.  She was on Coumadin for DVT but off Coumadin for 2 weeks due to recent GI bleeding.  She is found hyperglycemia with anion gap at 17.  ED physician started insulin drip and request admission for DKA. PAST MEDICAL HISTORY:   Past Medical History:  Diagnosis Date  . Anxiety    h/o  . COPD (chronic obstructive pulmonary disease) (HCC)   . Diabetes mellitus without complication (HCC)   . GERD (gastroesophageal reflux disease)   . Hypertension    bp under control-off meds since 2019    PAST SURGICAL HISTORY:   Past Surgical History:  Procedure Laterality Date  . ABDOMINAL HYSTERECTOMY    . CHOLECYSTECTOMY    . ESOPHAGOGASTRODUODENOSCOPY (EGD) WITH PROPOFOL N/A 06/15/2018   Procedure: ESOPHAGOGASTRODUODENOSCOPY (EGD) WITH PROPOFOL;  Surgeon: Toledo, Boykin Nearing, MD;  Location: [facility] ENDOSCOPY;  Service: Gastroenterology;  Laterality: N/A;  . LOWER EXTREMITY ANGIOGRAPHY Right 08/21/2018   Procedure: LOWER EXTREMITY ANGIOGRAPHY;  Surgeon: Annice Needy, MD;  Location: [facility] INVASIVE CV LAB;  Service: Cardiovascular;  Laterality: Right;  . LOWER EXTREMITY ANGIOGRAPHY Left 08/28/2018   Procedure: LOWER EXTREMITY ANGIOGRAPHY;  Surgeon: Annice Needy, MD;  Location: [facility] INVASIVE CV LAB;  Service: Cardiovascular;  Laterality: Left;  . LOWER EXTREMITY ANGIOGRAPHY Right 08/28/2018   Procedure: Lower Extremity Angiography;  Surgeon: Annice Needy, MD;  Location: [facility] INVASIVE CV LAB;  Service: Cardiovascular;  Laterality: Right;    SOCIAL HISTORY:   Social History   Tobacco Use  . Smoking status: Former Smoker    Packs/day: 0.25    Years: 45.00    Pack years: 11.25    Types: Cigarettes  . Smokeless tobacco: Never Used  . Tobacco comment: down to 1 cig daily  Substance Use Topics  . Alcohol use: No    Frequency: Never    FAMILY HISTORY:   Family History  Problem Relation Age of Onset  . Breast cancer Mother 1    DRUG ALLERGIES:   Allergies  Allergen Reactions  . Penicillins Hives    Has patient had a PCN reaction causing immediate rash, facial/tongue/throat swelling, SOB or lightheadedness with hypotension: Yes Has patient had a PCN reaction causing severe rash involving mucus membranes or skin necrosis: No Has patient had a PCN reaction that required hospitalization: No Has patient had a PCN reaction occurring within the last 10 years: No If all of the above answers are "NO", then may proceed with Cephalosporin use.  . Tramadol Itching    REVIEW OF SYSTEMS:   Review of Systems  Constitutional: Positive for malaise/fatigue. Negative for chills and fever.  HENT: Negative for sore throat.   Eyes: Negative for blurred vision and double vision.  Respiratory: Negative  for cough, hemoptysis, shortness of breath, wheezing and stridor.   Cardiovascular: Negative for chest pain, palpitations, orthopnea and leg swelling.  Gastrointestinal: Positive for abdominal pain, nausea and vomiting. Negative for blood in stool, diarrhea and melena.  Genitourinary: Negative for dysuria, flank pain and hematuria.  Musculoskeletal: Negative for back pain and joint pain.  Skin: Negative for rash.  Neurological: Negative for dizziness,  sensory change, focal weakness, seizures, loss of consciousness, weakness and headaches.  Endo/Heme/Allergies: Negative for polydipsia.  Psychiatric/Behavioral: Negative for depression. The patient is not nervous/anxious.     MEDICATIONS AT HOME:   Prior to Admission medications   Medication Sig Start Date End Date Taking? Authorizing Provider  albuterol (PROVENTIL HFA;VENTOLIN HFA) 108 (90 Base) MCG/ACT inhaler Inhale 2 puffs into the lungs every 6 (six) hours as needed for wheezing or shortness of breath. 08/20/17  Yes Jeanmarie PlantMcShane, James A, MD  alum & mag hydroxide-simeth (MAALOX/MYLANTA) 200-200-20 MG/5ML suspension Take 30 mLs by mouth every 4 (four) hours as needed for indigestion or heartburn. 06/16/18  Yes Ojie, Jude, MD  atorvastatin (LIPITOR) 10 MG tablet Take 1 tablet (10 mg total) by mouth daily. 08/21/18 08/21/19 Yes Stegmayer, Ranae PlumberKimberly A, PA-C  Biotin 1000 MCG tablet Take 1,000 tablets by mouth daily.    Yes [provider]  cetirizine (ZYRTEC) 10 MG tablet Take 10 mg by mouth daily.   Yes [provider]  cholecalciferol (VITAMIN D3) 25 MCG (1000 UT) tablet Take 1,000 Units by mouth daily.   Yes [provider]  iron polysaccharides (NIFEREX) 150 MG capsule Take 1 capsule (150 mg total) by mouth daily. 08/30/18  Yes Dew, Marlow BaarsJason S, MD  Multiple Vitamins-Minerals (WOMENS MULTIVITAMIN PO) Take 1 tablet by mouth daily.   Yes [provider]  pantoprazole (PROTONIX) 40 MG tablet Take 1 tablet (40 mg total) by mouth daily. 06/17/18  Yes Ojie, Jude, MD  sitaGLIPtin (JANUVIA) 100 MG tablet Take 100 mg by mouth daily.   Yes [provider]  vitamin C (ASCORBIC ACID) 500 MG tablet Take 500 mg by mouth daily.   Yes [provider]  warfarin (COUMADIN) 5 MG tablet Take 1 tablet (5 mg total) by mouth daily at 6 PM. 10/09/18 04/07/19 Yes Georgiana SpinnerBrown, Fallon E, NP      VITAL SIGNS:  Blood pressure (!) 179/73, pulse 81, temperature 99 F (37.2 C), temperature  source Oral, resp. rate 17, height 5\' 7"  (1.702 m), weight 65.8 kg, SpO2 100 %.  PHYSICAL EXAMINATION:  Physical Exam  GENERAL:  63 y.o.-year-old patient lying in the bed with no acute distress.  EYES: Pupils equal, round, reactive to light and accommodation. No scleral icterus. Extraocular muscles intact.  HEENT: Head atraumatic, normocephalic. Oropharynx and nasopharynx clear.  NECK:  Supple, no jugular venous distention. No thyroid enlargement, no tenderness.  LUNGS: Normal breath sounds bilaterally, no wheezing, rales,rhonchi or crepitation. No use of accessory muscles of respiration.  CARDIOVASCULAR: S1, S2 normal. No murmurs, rubs, or gallops.  ABDOMEN: Soft, diffuse abdominal tenderness, nondistended.  No rigidity or rebound.  Bowel sounds present. No organomegaly or mass.  EXTREMITIES: No pedal edema, cyanosis, or clubbing.  NEUROLOGIC: Cranial nerves II through XII are intact. Muscle strength 5/5 in all extremities. Sensation intact. Gait not checked.  PSYCHIATRIC: The patient is alert and oriented x 3.  SKIN: No obvious rash, lesion, or ulcer.   LABORATORY PANEL:   CBC Recent Labs  Lab 12/14/18 1240  WBC 15.4*  HGB 11.1*  HCT 32.1*  PLT 312   ------------------------------------------------------------------------------------------------------------------  Chemistries  Recent Labs  Lab 12/14/18 1240  NA 141  K 3.8  CL 104  CO2 20*  GLUCOSE 291*  BUN 14  CREATININE 1.04*  CALCIUM 10.8*  AST 17  ALT 15  ALKPHOS 86  BILITOT 1.2   ------------------------------------------------------------------------------------------------------------------  Cardiac Enzymes No results for input(s): TROPONINI in the last 168 hours. ------------------------------------------------------------------------------------------------------------------  RADIOLOGY:  Ct Abdomen Pelvis W Contrast  Result Date: 12/14/2018 CLINICAL DATA:  Abdo pain with nausea and vomiting EXAM: CT  ABDOMEN AND PELVIS WITH CONTRAST TECHNIQUE: Multidetector CT imaging of the abdomen and pelvis was performed using the standard protocol following bolus administration of intravenous contrast. CONTRAST:  OMNIPAQUE IOHEXOL 300 MG/ML  SOLN COMPARISON:  June 13, 2018 FINDINGS: Lower chest: There is bibasilar atelectasis. There is no lung base edema or consolidation. Hepatobiliary: There is hepatic steatosis. No focal liver lesions are evident. The gallbladder is absent. There is no appreciable biliary duct dilatation. Pancreas: There is no pancreatic mass or inflammatory focus. Spleen: No splenic lesions are evident. Adrenals/Urinary Tract: Adrenals bilaterally appear unremarkable. There is a 7 x 5 mm cyst in the periphery of the mid right kidney. There is no evident hydronephrosis on either side. There is no evident renal or ureteral calculus on either side. Urinary bladder is midline with wall thickness within normal limits. Stomach/Bowel: Most small bowel loops are fluid-filled. There is no appreciable bowel wall or mesenteric thickening. No evident bowel obstruction. There are cecal diverticula without inflammation. The terminal ileum appears unremarkable. There is no evident free air or portal venous air. Vascular/Lymphatic: There is extensive aortic and iliac artery atherosclerosis. There is extensive plaque in the mid aorta which appears to cause subtotal occlusion at the level of the lower poles of the kidneys. Distal aorta is mildly ectatic with a maximum transverse diameter of 2.3 x 2.3 cm. No frank aneurysm. No adenopathy is evident in the abdomen or pelvis. Reproductive: Uterus is absent.  No evident pelvic mass. Other: Appendix appears unremarkable. No evident abscess or ascites in the abdomen or pelvis. Musculoskeletal: There is degenerative change in each hip joint and lower lumbar spine. There are no blastic or lytic bone lesions. There is no intramuscular or abdominal wall lesion. IMPRESSION:  1. Extensive aortoiliac atherosclerosis. Extensive plaque in particular is noted in the aorta with subtotal occlusion in the mid aorta at the level of the lower poles of the kidneys. 2. Most small bowel loops are fluid-filled. This is a finding that may be indicative of a degree of ileus or enteritis. No bowel obstruction evident. There are diverticula arising from the cecum without diverticulitis. No diverticulitis seen elsewhere. 3.  No abscess in the abdomen or pelvis.  Appendix appears normal. 4. No renal or ureteral calculi. No hydronephrosis. Urinary bladder wall thickness within normal limits. 5.  Hepatic steatosis.  Gallbladder absent. 6.  Uterus absent. Electronically Signed   By: Bretta Bang III M.D.   On: 12/14/2018 14:17      IMPRESSION AND PLAN:   DKA with history of diabetes 2. The patient will be admitted to ICU. Continue insulin drip, DKA protocol.  N.p.o. with IV fluid support.  BMP every 4 hours.  Acute gastroenteritis.  IV fluid support, Zofran as needed, supportive care. Leukocytosis.  Possible due to reaction, follow-up CBC and urinalysis. Extensive aortoiliac atherosclerosis.  Vascular consult.  [place] to Dr. Gilda Crease. History of DVT.  The patient was on Coumadin, off Coumadin for 2 weeks due to recent GI bleeding. Hypertension.  Continue  home hypertension medication after resuming diet. COPD.  Stable.  All the records are reviewed and case discussed with ED provider. Management plans discussed with the patient, family and they are in agreement.  CODE STATUS:   TOTAL TIME TAKING CARE OF THIS PATIENT: 52 minutes.    Demetrios Loll M.D on 12/14/2018 at 4:51 PM  Between 7am to 6pm - Pager - [phone]  After 6pm go to [email or web address] - Technical brewer [facility]  Office  [phone]  CC: Primary care physician; [facility] [place]   Note: This dictation was prepared with Diplomatic Services operational officer dictation along with smaller  phrase technology. Any transcriptional errors that result from this process are unin

## 2018-12-14 NOTE — ED Notes (Signed)
ED TO INPATIENT HANDOFF REPORT  ED Nurse Name and Phone #: Reuel BoomDaniel [phone]  S Name/Age/Gender Sylvia [name] 63 y.o. female Room/Bed: ED18A/ED18A  Code Status   Code Status: Prior  Home/SNF/Other Home Patient oriented to: self, place, time and situation Is this baseline? Yes   Triage Complete: Triage complete  Chief Complaint abd pain  Triage Note Pt complains of abd pain started at 2am. Pt seems drowsy.    Allergies Allergies  Allergen Reactions  . Penicillins Hives    Has patient had a PCN reaction causing immediate rash, facial/tongue/throat swelling, SOB or lightheadedness with hypotension: Yes Has patient had a PCN reaction causing severe rash involving mucus membranes or skin necrosis: No Has patient had a PCN reaction that required hospitalization: No Has patient had a PCN reaction occurring within the last 10 years: No If all of the above answers are "NO", then may proceed with Cephalosporin use.  . Tramadol Itching    Level of Care/Admitting Diagnosis ED Disposition    ED Disposition Condition Comment   Admit  [facility] [[id number]]  Level of Care: Stepdown [14]  Covid Evaluation: Confirmed COVID Negative  Diagnosis: DKA (diabetic ketoacidoses) [facility](HCC) [[id number]][[id number]]  Admitting Physician: Shaune PollackHEN, QING [phone][[id number]]  Attending Physician: Shaune PollackCHEN, QING [[id number]]  Estimated length of stay: past midnight tomorrow  Certification:: I certify this patient will need inpatient services for at least 2 midnights  PT Class (Do Not Modify): Inpatient [101]  PT Acc Code (Do Not Modify): Private [1]       B Medical/Surgery History Past Medical History:  Diagnosis Date  . Anxiety    h/o  . COPD (chronic obstructive pulmonary disease) (HCC)   . Diabetes mellitus without complication (HCC)   . GERD (gastroesophageal reflux disease)   . Hypertension    bp under control-off meds since 2019   Past Surgical History:  Procedure Laterality  Date  . ABDOMINAL HYSTERECTOMY    . CHOLECYSTECTOMY    . ESOPHAGOGASTRODUODENOSCOPY (EGD) WITH PROPOFOL N/A 06/15/2018   Procedure: ESOPHAGOGASTRODUODENOSCOPY (EGD) WITH PROPOFOL;  Surgeon: Toledo, Boykin Nearingeodoro K, MD;  Location: [facility] ENDOSCOPY;  Service: Gastroenterology;  Laterality: N/A;  . LOWER EXTREMITY ANGIOGRAPHY Right 08/21/2018   Procedure: LOWER EXTREMITY ANGIOGRAPHY;  Surgeon: Annice Needyew, Jason S, MD;  Location: [facility] INVASIVE CV LAB;  Service: Cardiovascular;  Laterality: Right;  . LOWER EXTREMITY ANGIOGRAPHY Left 08/28/2018   Procedure: LOWER EXTREMITY ANGIOGRAPHY;  Surgeon: Annice Needyew, Jason S, MD;  Location: [facility] INVASIVE CV LAB;  Service: Cardiovascular;  Laterality: Left;  . LOWER EXTREMITY ANGIOGRAPHY Right 08/28/2018   Procedure: Lower Extremity Angiography;  Surgeon: Annice Needyew, Jason S, MD;  Location: [facility] INVASIVE CV LAB;  Service: Cardiovascular;  Laterality: Right;     A IV Location/Drains/Wounds Patient Lines/Drains/Airways Status   Active Line/Drains/Airways    Name:   Placement date:   Placement time:   Site:   Days:   Peripheral IV 06/15/18 Right;Lateral Forearm   06/15/18    0624    Forearm   182   Peripheral IV 08/28/18 Anterior;Proximal;Right Forearm   08/28/18    0726    Forearm   108   Peripheral IV 12/14/18 Right Hand   12/14/18    1445    Hand   less than 1   Peripheral IV 12/14/18 Right Antecubital   12/14/18    1305    Antecubital   less than 1   Sheath 08/21/18 Left Arterial;Femoral   08/21/18    [id number]  Arterial;Femoral   115   Sheath 08/28/18 Right Arterial;Femoral   08/28/18    0830    Arterial;Femoral   108   Sheath 08/28/18 Right Arterial;Femoral   08/28/18    0924    Arterial;Femoral   108          Intake/Output Last 24 hours  Intake/Output Summary (Last 24 hours) at 12/14/2018 1907 Last data filed at 12/14/2018 1649 Gross per 24 hour  Intake 100 ml  Output --  Net 100 ml    Labs/Imaging Results for orders placed or performed during the hospital encounter of  12/14/18 (from the past 48 hour(s))  Basic metabolic panel     Status: Abnormal   Collection Time: 12/14/18 12:40 PM  Result Value Ref Range   Sodium 141 135 - 145 mmol/L   Potassium 3.8 3.5 - 5.1 mmol/L   Chloride 104 98 - 111 mmol/L   CO2 20 (L) 22 - 32 mmol/L   Glucose, Bld 291 (H) 70 - 99 mg/dL   BUN 14 8 - 23 mg/dL   Creatinine, Ser 1.04 (H) 0.44 - 1.00 mg/dL   Calcium 10.8 (H) 8.9 - 10.3 mg/dL   GFR calc non Af Amer 57 (L) >60 mL/min   GFR calc Af Amer >60 >60 mL/min   Anion gap 17 (H) 5 - 15    Comment: Performed at [facility], [address] [place], [place] [address]  Hepatic function panel     Status: Abnormal   Collection Time: 12/14/18 12:40 PM  Result Value Ref Range   Total Protein 8.4 (H) 6.5 - 8.1 g/dL   Albumin 4.3 3.5 - 5.0 g/dL   AST 17 15 - 41 U/L   ALT 15 0 - 44 U/L   Alkaline Phosphatase 86 38 - 126 U/L   Total Bilirubin 1.2 0.3 - 1.2 mg/dL   Bilirubin, Direct 0.1 0.0 - 0.2 mg/dL   Indirect Bilirubin 1.1 (H) 0.3 - 0.9 mg/dL    Comment: Performed at [facility], [address] [place], [place] [address]  Lipase, blood     Status: None   Collection Time: 12/14/18 12:40 PM  Result Value Ref Range   Lipase 24 11 - 51 U/L    Comment: Performed at [facility], [address] [place],  [address]  CBC with Differential     Status: Abnormal   Collection Time: 12/14/18 12:40 PM  Result Value Ref Range   WBC 15.4 (H) 4.0 - 10.5 K/uL   RBC 3.94 3.87 - 5.11 MIL/uL   Hemoglobin 11.1 (L) 12.0 - 15.0 g/dL   HCT 32.1 (L) 36.0 - 46.0 %   MCV 81.5 80.0 - 100.0 fL   MCH 28.2 26.0 - 34.0 pg   MCHC 34.6 30.0 - 36.0 g/dL   RDW 13.9 11.5 - 15.5 %   Platelets 312 150 - 400 K/uL   nRBC 0.0 0.0 - 0.2 %   Neutrophils Relative % 88 %   Neutro Abs 13.6 (H) 1.7 - 7.7 K/uL   Lymphocytes Relative 8 %   Lymphs Abs 1.2 0.7 - 4.0 K/uL   Monocytes Relative 3 %   Monocytes Absolute 0.5 0.1 - 1.0 K/uL   Eosinophils Relative 0 %    Eosinophils Absolute 0.0 0.0 - 0.5 K/uL   Basophils Relative 0 %   Basophils Absolute 0.1 0.0 - 0.1 K/uL   Immature Granulocytes 1 %   Abs Immature Granulocytes 0.09 (H) 0.00 - 0.07 K/uL  Comment: Performed at [facility], [address] [place], [place] [address]  Beta-hydroxybutyric acid     Status: Abnormal   Collection Time: 12/14/18 12:40 PM  Result Value Ref Range   Beta-Hydroxybutyric Acid 0.49 (H) 0.05 - 0.27 mmol/L    Comment: Performed at [facility], [address] [place], [place] [address]  Lactic acid, plasma     Status: None   Collection Time: 12/14/18 12:41 PM  Result Value Ref Range   Lactic Acid, Venous 1.7 0.5 - 1.9 mmol/L    Comment: Performed at [facility], [address] [place], [place] [address]  Blood gas, venous     Status: Abnormal   Collection Time: 12/14/18  2:41 PM  Result Value Ref Range   pH, Ven 7.32 7.250 - 7.430   pCO2, Ven 36 (L) 44.0 - 60.0 mmHg   pO2, Ven 33.0 32.0 - 45.0 mmHg   Patient temperature 37.0    Collection site VENOUS    Sample type VENOUS     Comment: Performed at [facility], [address] [place] [address] [place], [place] [address]  Glucose, capillary     Status: Abnormal   Collection Time: 12/14/18  2:58 PM  Result Value Ref Range   Glucose-Capillary 247 (H) 70 - 99 mg/dL  SARS Coronavirus 2 [facility] order, Performed in [facility] hospital lab) Nasopharyngeal Nasopharyngeal Swab     Status: None   Collection Time: 12/14/18  3:11 PM   Specimen: Nasopharyngeal Swab  Result Value Ref Range   SARS Coronavirus 2 NEGATIVE NEGATIVE    Comment: (NOTE) If result is NEGATIVE SARS-CoV-2 target nucleic acids are NOT DETECTED. The SARS-CoV-2 RNA is generally detectable in upper and lower  respiratory specimens during the acute phase of infection. The lowest  concentration of SARS-CoV-2 viral copies this assay can detect is 250  copies / mL. A negative result does not preclude SARS-CoV-2 infection   and should not be used as the sole basis for treatment or other  patient management decisions.  A negative result may occur with  improper specimen collection / handling, submission of specimen other  than nasopharyngeal swab, presence of viral mutation(s) within the  areas targeted by this assay, and inadequate number of viral copies  (<250 copies / mL). A negative result must be combined with clinical  observations, patient history, and epidemiological information. If result is POSITIVE SARS-CoV-2 target nucleic acids are DETECTED. The SARS-CoV-2 RNA is generally detectable in upper and lower  respiratory specimens dur ing the acute phase of infection.  Positive  results are indicative of active infection with SARS-CoV-2.  Clinical  correlation with patient history and other diagnostic information is  necessary to determine patient infection status.  Positive results do  not rule out bacterial infection or co-infection with other viruses. If result is PRESUMPTIVE POSTIVE SARS-CoV-2 nucleic acids MAY BE PRESENT.   A presumptive positive result was obtained on the submitted specimen  and confirmed on repeat testing.  While 2019 novel coronavirus  (SARS-CoV-2) nucleic acids may be present in the submitted sample  additional confirmatory testing may be necessary for epidemiological  and / or clinical management purposes  to differentiate between  SARS-CoV-2 and other Sarbecovirus currently known to infect humans.  If clinically indicated additional testing with an alternate test  methodology [phone]) is advised. The SARS-CoV-2 RNA is generally  detectable in upper and lower respiratory sp ecimens during the acute  phase of infection. The expected result is Negative. Fact  Sheet for Patients:  BoilerBrush.com.cyhttps://www.fda.gov/media/136312/download Fact Sheet for Healthcare Providers: https://pope.com/https://www.fda.gov/media/136313/download This test is not yet approved or cleared by the [place] FDA  and has been authorized for detection and/or diagnosis of SARS-CoV-2 by FDA under an Emergency Use Authorization (EUA).  This EUA will remain in effect (meaning this test can be used) for the duration of the COVID-19 declaration under Section 564(b)(1) of the Act, 21 U.S.C. section 360bbb-3(b)(1), unless the authorization is terminated or revoked sooner. Performed at [facility], [address] [place], [place] [address]   Glucose, capillary     Status: Abnormal   Collection Time: 12/14/18  4:08 PM  Result Value Ref Range   Glucose-Capillary 236 (H) 70 - 99 mg/dL  Glucose, capillary     Status: Abnormal   Collection Time: 12/14/18  5:07 PM  Result Value Ref Range   Glucose-Capillary 214 (H) 70 - 99 mg/dL  Glucose, capillary     Status: Abnormal   Collection Time: 12/14/18  6:45 PM  Result Value Ref Range   Glucose-Capillary 189 (H) 70 - 99 mg/dL   Ct Abdomen Pelvis W Contrast  Result Date: 12/14/2018 CLINICAL DATA:  Abdo pain with nausea and vomiting EXAM: CT ABDOMEN AND PELVIS WITH CONTRAST TECHNIQUE: Multidetector CT imaging of the abdomen and pelvis was performed using the standard protocol following bolus administration of intravenous contrast. CONTRAST:  100mL OMNIPAQUE IOHEXOL 300 MG/ML  SOLN COMPARISON:  June 13, 2018 FINDINGS: Lower chest: There is bibasilar atelectasis. There is no lung base edema or consolidation. Hepatobiliary: There is hepatic steatosis. No focal liver lesions are evident. The gallbladder is absent. There is no appreciable biliary duct dilatation. Pancreas: There is no pancreatic mass or inflammatory focus. Spleen: No splenic lesions are evident. Adrenals/Urinary Tract: Adrenals bilaterally appear unremarkable. There is a 7 x 5 mm cyst in the periphery of the mid right kidney. There is no evident hydronephrosis on either side. There is no evident renal or ureteral calculus on either side. Urinary bladder is midline with wall thickness within  normal limits. Stomach/Bowel: Most small bowel loops are fluid-filled. There is no appreciable bowel wall or mesenteric thickening. No evident bowel obstruction. There are cecal diverticula without inflammation. The terminal ileum appears unremarkable. There is no evident free air or portal venous air. Vascular/Lymphatic: There is extensive aortic and iliac artery atherosclerosis. There is extensive plaque in the mid aorta which appears to cause subtotal occlusion at the level of the lower poles of the kidneys. Distal aorta is mildly ectatic with a maximum transverse diameter of 2.3 x 2.3 cm. No frank aneurysm. No adenopathy is evident in the abdomen or pelvis. Reproductive: Uterus is absent.  No evident pelvic mass. Other: Appendix appears unremarkable. No evident abscess or ascites in the abdomen or pelvis. Musculoskeletal: There is degenerative change in each hip joint and lower lumbar spine. There are no blastic or lytic bone lesions. There is no intramuscular or abdominal wall lesion. IMPRESSION: 1. Extensive aortoiliac atherosclerosis. Extensive plaque in particular is noted in the aorta with subtotal occlusion in the mid aorta at the level of the lower poles of the kidneys. 2. Most small bowel loops are fluid-filled. This is a finding that may be indicative of a degree of ileus or enteritis. No bowel obstruction evident. There are diverticula arising from the cecum without diverticulitis. No diverticulitis seen elsewhere. 3.  No abscess in the abdomen or pelvis.  Appendix appears normal. 4. No renal or ureteral calculi. No hydronephrosis. Urinary bladder wall  thickness within normal limits. 5.  Hepatic steatosis.  Gallbladder absent. 6.  Uterus absent. Electronically Signed   By: Bretta Bang III M.D.   On: 12/14/2018 14:17    Pending Labs Unresulted Labs (From admission, onward)    Start     Ordered   12/14/18 1900  Brain natriuretic peptide  Once,   STAT     12/14/18 1859   12/14/18 1757   Urinalysis, Complete w Microscopic  Once,   STAT     12/14/18 1756   12/14/18 1238  Lactic acid, plasma  Now then every 2 hours,   STAT     12/14/18 1237   Signed and Held  Basic metabolic panel  STAT Now then every 4 hours ,   STAT     Signed and Held   Signed and Held  Hemoglobin A1c  Once,   R    Comments: To assess prior glycemic control.    Signed and Held   Signed and Held  Creatinine, serum  (enoxaparin (LOVENOX)    CrCl >/= 30 ml/min)  Weekly,   R    Comments: while on enoxaparin therapy    Signed and Held          Vitals/Pain Today's Vitals   12/14/18 1700 12/14/18 1730 12/14/18 1800 12/14/18 1830  BP: (!) 190/91 (!) 173/77 (!) 171/74 (!) 172/70  Pulse: 79 78 87 78  Resp: 20 (!) 21 14 19   Temp:      TempSrc:      SpO2: 98% 100% 100% 100%  Weight:      Height:      PainSc:        Isolation Precautions No active isolations  Medications Medications  insulin regular, human (MYXREDLIN) 100 units/ 100 mL infusion (5.2 Units/hr Intravenous Rate/Dose Change 12/14/18 1606)  dextrose 5 %-0.45 % sodium chloride infusion ( Intravenous New Bag/Given 12/14/18 1502)  potassium chloride 10 mEq in 100 mL IVPB (0 mEq Intravenous Stopped 12/14/18 1649)  sodium chloride 0.9 % bolus 1,000 mL (1,000 mLs Intravenous New Bag/Given 12/14/18 1501)    And  0.9 %  sodium chloride infusion (has no administration in time range)  lactated ringers bolus 1,000 mL (0 mLs Intravenous Paused 12/14/18 1500)  morphine 4 MG/ML injection 4 mg (4 mg Intravenous Given 12/14/18 1248)  ondansetron (ZOFRAN) injection 4 mg (4 mg Intravenous Given 12/14/18 1245)  iohexol (OMNIPAQUE) 300 MG/ML solution 100 mL (100 mLs Intravenous Contrast Given 12/14/18 1356)    Mobility walks Low fall risk   Focused Assessments N/V/D   R Recommendations: See Admitting Provider Note  Report given to:   Additional Notes:

## 2018-12-14 NOTE — ED Triage Notes (Signed)
Pt complains of abd pain started at 2am. Pt seems drowsy.

## 2018-12-14 NOTE — Progress Notes (Signed)
Advanced Care Plan.  Purpose of Encounter: CODE STATUS. Parties in Attendance: The patient and me. Patient's Decisional Capacity: Yes. Medical Story: Sylvia [name]  is a 63 y.o. female with a known history of hypertension, diabetes, COPD, GERD and anxiety.  The patient is being admitted for DKA and acute gastritis.  I discussed with patient about her current condition, prognosis and CODE STATUS.  The patient wants to be resuscitated and intubated if she has cardiopulmonary arrest. Plan:  Code Status: Full code. Time spent discussing advance care planning:17 minutes.

## 2018-12-14 NOTE — Telephone Encounter (Signed)
I spoke with Patient and advised her of the below. Patient is sick today with vomiting and diarrhea and isnt feeling well. She is unlikely to do stool test today. She would like to move her apt to be seen to a sooner apt.   Can you please contact patient to move apt to sooner date per [facility]. AS< CMA

## 2018-12-14 NOTE — Telephone Encounter (Signed)
CALLED PATIENT TO [place] [facility]PT HOWEVER WAS RUSHED TO HOSPITAL TODAY. WILL CB TO TO SCHEDULE ASAP

## 2018-12-14 NOTE — ED Provider Notes (Signed)
[facility] Emergency Department Provider Note   ____________________________________________   First MD Initiated Contact with Patient 12/14/18 1231     (approximate)  I have reviewed the triage vital signs and the nursing notes.   HISTORY  Chief Complaint Abdominal Pain    HPI [name] is a 63 y.o. female 63 year old female with past medical history of hypertension, diabetes, peripheral arterial disease presents to the ED complaining of abdominal pain.  Patient reports that since around 2 AM this morning she has had upper abdominal pain associated with vomiting and diarrhea.  She has not noticed any blood in her vomit or stool, reports that about 2 weeks ago she had noticed dark stools and stopped taking Coumadin she was prescribed for peripheral arterial disease since then.  She has not noticed any dark black stools more recently.  Pain is primarily located in her epigastrium and is not exacerbated or alleviated by anything.  She denies any fevers, cough, chills, chest pain, or shortness of breath.  She has not had any sick contacts.        Past Medical History:  Diagnosis Date  . Anxiety    h/o  . COPD (chronic obstructive pulmonary disease) (HCC)   . Diabetes mellitus without complication (HCC)   . GERD (gastroesophageal reflux disease)   . Hypertension    bp under control-off meds since 2019    Patient Active Problem List   Diagnosis Date Noted  . Atherosclerosis of artery of extremity with rest pain (HCC) 08/28/2018  . PAD (peripheral artery disease) (HCC) 07/14/2018  . Abdominal pain 06/13/2018  . HTN (hypertension) 06/13/2018  . Diabetes (HCC) 06/13/2018  . Elevated lactic acid level 06/13/2018    Past Surgical History:  Procedure Laterality Date  . ABDOMINAL HYSTERECTOMY    . CHOLECYSTECTOMY    . ESOPHAGOGASTRODUODENOSCOPY (EGD) WITH PROPOFOL N/A 06/15/2018   Procedure: ESOPHAGOGASTRODUODENOSCOPY (EGD) WITH PROPOFOL;   Surgeon: Toledo, Boykin Nearingeodoro K, MD;  Location: [facility] ENDOSCOPY;  Service: Gastroenterology;  Laterality: N/A;  . LOWER EXTREMITY ANGIOGRAPHY Right 08/21/2018   Procedure: LOWER EXTREMITY ANGIOGRAPHY;  Surgeon: Annice Needyew, Jason S, MD;  Location: [facility] INVASIVE CV LAB;  Service: Cardiovascular;  Laterality: Right;  . LOWER EXTREMITY ANGIOGRAPHY Left 08/28/2018   Procedure: LOWER EXTREMITY ANGIOGRAPHY;  Surgeon: Annice Needyew, Jason S, MD;  Location: [facility] INVASIVE CV LAB;  Service: Cardiovascular;  Laterality: Left;  . LOWER EXTREMITY ANGIOGRAPHY Right 08/28/2018   Procedure: Lower Extremity Angiography;  Surgeon: Annice Needyew, Jason S, MD;  Location: [facility] INVASIVE CV LAB;  Service: Cardiovascular;  Laterality: Right;    Prior to Admission medications   Medication Sig Start Date End Date Taking? Authorizing Provider  albuterol (PROVENTIL HFA;VENTOLIN HFA) 108 (90 Base) MCG/ACT inhaler Inhale 2 puffs into the lungs every 6 (six) hours as needed for wheezing or shortness of breath. 08/20/17  Yes Jeanmarie PlantMcShane, James A, MD  alum & mag hydroxide-simeth (MAALOX/MYLANTA) 200-200-20 MG/5ML suspension Take 30 mLs by mouth every 4 (four) hours as needed for indigestion or heartburn. 06/16/18  Yes Ojie, Jude, MD  atorvastatin (LIPITOR) 10 MG tablet Take 1 tablet (10 mg total) by mouth daily. 08/21/18 08/21/19 Yes Stegmayer, Ranae PlumberKimberly A, PA-C  Biotin 1000 MCG tablet Take 1,000 tablets by mouth daily.    Yes [provider]  cetirizine (ZYRTEC) 10 MG tablet Take 10 mg by mouth daily.   Yes [provider]  cholecalciferol (VITAMIN D3) 25 MCG (1000 UT) tablet Take 1,000 Units by mouth daily.   Yes [provider]  iron polysaccharides (NIFEREX) 150 MG capsule Take 1 capsule (150 mg total) by mouth daily. 08/30/18  Yes Dew, Erskine Squibb, MD  Multiple Vitamins-Minerals (WOMENS MULTIVITAMIN PO) Take 1 tablet by mouth daily.   Yes [provider]  pantoprazole (PROTONIX) 40 MG tablet Take 1 tablet (40 mg total) by mouth daily. 06/17/18   Yes Ojie, Jude, MD  sitaGLIPtin (JANUVIA) 100 MG tablet Take 100 mg by mouth daily.   Yes [provider]  vitamin C (ASCORBIC ACID) 500 MG tablet Take 500 mg by mouth daily.   Yes [provider]  warfarin (COUMADIN) 5 MG tablet Take 1 tablet (5 mg total) by mouth daily at 6 PM. 10/09/18 04/07/19 Yes Kris Hartmann, NP    Allergies Penicillins and Tramadol  Family History  Problem Relation Age of Onset  . Breast cancer Mother 103    Social History Social History   Tobacco Use  . Smoking status: Former Smoker    Packs/day: 0.25    Years: 45.00    Pack years: 11.25    Types: Cigarettes  . Smokeless tobacco: Never Used  . Tobacco comment: down to 1 cig daily  Substance Use Topics  . Alcohol use: No    Frequency: Never  . Drug use: Never    Review of Systems  Constitutional: No fever/chills Eyes: No visual changes. ENT: No sore throat. Cardiovascular: Denies chest pain. Respiratory: Denies shortness of breath. Gastrointestinal: Positive for abdominal pain, nausea, vomiting, and diarrhea.  No constipation. Genitourinary: Negative for dysuria. Musculoskeletal: Negative for back pain. Skin: Negative for rash. Neurological: Negative for headaches, focal weakness or numbness.  ____________________________________________   PHYSICAL EXAM:  VITAL SIGNS: ED Triage Vitals [12/14/18 1229]  Enc Vitals Group     BP      Pulse      Resp      Temp      Temp src      SpO2 100 %     Weight      Height      Head Circumference      Peak Flow      Pain Score      Pain Loc      Pain Edu?      Excl. in [place]?     Constitutional: Alert and oriented. Eyes: Conjunctivae are normal. Head: Atraumatic. Nose: No congestion/rhinnorhea. Mouth/Throat: Mucous membranes are moist. Neck: Normal ROM Cardiovascular: Normal rate, regular rhythm. Grossly normal heart sounds. Respiratory: Normal respiratory effort.  No retractions. Lungs CTAB. Gastrointestinal: Soft  and tender to palpation in epigastrium without rebound or guarding. No distention. Genitourinary: deferred Musculoskeletal: No lower extremity tenderness nor edema. Neurologic:  Normal speech and language. No gross focal neurologic deficits are appreciated. Skin:  Skin is warm, dry and intact. No rash noted. Psychiatric: Mood and affect are normal. Speech and behavior are normal.  ____________________________________________   LABS (all labs ordered are listed, but only abnormal results are displayed)  Labs Reviewed  BASIC METABOLIC PANEL - Abnormal; Notable for the following components:      Result Value   CO2 20 (*)    Glucose, Bld 291 (*)    Creatinine, Ser 1.04 (*)    Calcium 10.8 (*)    GFR calc non Af Amer 57 (*)    Anion gap 17 (*)    All other components within normal limits  HEPATIC FUNCTION PANEL - Abnormal; Notable for the following components:   Total Protein 8.4 (*)  Indirect Bilirubin 1.1 (*)    All other components within normal limits  CBC WITH DIFFERENTIAL/PLATELET - Abnormal; Notable for the following components:   WBC 15.4 (*)    Hemoglobin 11.1 (*)    HCT 32.1 (*)    Neutro Abs 13.6 (*)    Abs Immature Granulocytes 0.09 (*)    All other components within normal limits  BETA-HYDROXYBUTYRIC ACID - Abnormal; Notable for the following components:   Beta-Hydroxybutyric Acid 0.49 (*)    All other components within normal limits  BLOOD GAS, VENOUS - Abnormal; Notable for the following components:   pCO2, Ven 36 (*)    All other components within normal limits  GLUCOSE, CAPILLARY - Abnormal; Notable for the following components:   Glucose-Capillary 247 (*)    All other components within normal limits  GLUCOSE, CAPILLARY - Abnormal; Notable for the following components:   Glucose-Capillary 236 (*)    All other components within normal limits  SARS CORONAVIRUS 2 (HOSPITAL ORDER, PERFORMED IN [place] [facility])  LIPASE, BLOOD  LACTIC ACID, PLASMA   LACTIC ACID, PLASMA   ____________________________________________  EKG  ED ECG REPORT I, Chesley Noon, the attending physician, personally viewed and interpreted this ECG.   Date: 12/14/2018  EKG Time: 12:32  Rate: 87  Rhythm: normal sinus rhythm  Axis: Normal  Intervals:Borderline prolonged QT  ST&T Change: None   PROCEDURES  Procedure(s) performed (including Critical Care):  .Critical Care Performed by: Chesley Noon, MD Authorized by: Chesley Noon, MD   Critical care provider statement:    Critical care time (minutes):  45   Critical care time was exclusive of:  Separately billable procedures and treating other patients and teaching time   Critical care was necessary to treat or prevent imminent or life-threatening deterioration of the following conditions:  Metabolic crisis   Critical care was time spent personally by me on the following activities:  Discussions with consultants, evaluation of patient's response to treatment, examination of patient, ordering and performing treatments and interventions, ordering and review of laboratory studies, ordering and review of radiographic studies, pulse oximetry, re-evaluation of patient's condition, obtaining history from patient or surrogate and review of old charts   I assumed direction of critical care for this patient from another provider in my specialty: no       ____________________________________________   INITIAL IMPRESSION / ASSESSMENT AND PLAN / ED COURSE       63 year old female with a history of peripheral arterial disease, recently stopped Coumadin due to concern for blood in her stool, presents to the ED with approximately 12 hours of epigastric abdominal pain associated with nausea and vomiting.  More recently, she has not noticed any blood in her stool, however given she recently stopped anticoagulants, would be at risk for mesenteric ischemia.  Will need to assess with CT and lactate.  Will check  LFTs and lipase, patient is status post cholecystectomy.  EKG without acute ischemic changes, given associated GI complaints, low suspicion for ACS as etiology of patient's symptoms.  Labs consistent with DKA given hyperglycemia, increased anion gap, acidosis, and elevated beta hydroxybutyrate.  Will start patient on insulin drip.  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  Do not suspect mesenteric ischemia as her lactate within normal limits in this occlusion would be too distal to cause mesenteric ischemia.  In further discussion with radiologist, lesion would  be expected to cause lower extremity discomfort and claudication, which patient currently denies.  She has 1+ DP pulses bilaterally with lower extremities being warm and well-perfused.  No indication for emergent involvement of vascular surgery and no need to heparinize at this time, although patient would likely benefit from restarting her Coumadin.  Case discussed with hospitalist, who accepts patient for admission for DKA.      ____________________________________________   FINAL CLINICAL IMPRESSION(S) / ED DIAGNOSES  Final diagnoses:  Type 2 diabetes mellitus with ketoacidosis without coma, without long-term current use of insulin (HCC)  Generalized abdominal pain  PAD (peripheral artery disease) [place])     ED Discharge Orders    None       Note:  This document was prepared using Dragon voice recognition software and may include unintentional dictation errors.   Chesley Noon, MD 12/14/18 [id number]

## 2018-12-15 ENCOUNTER — Inpatient Hospital Stay: Payer: Medicaid Other

## 2018-12-15 ENCOUNTER — Other Ambulatory Visit (INDEPENDENT_AMBULATORY_CARE_PROVIDER_SITE_OTHER): Payer: Self-pay | Admitting: Nurse Practitioner

## 2018-12-15 ENCOUNTER — Other Ambulatory Visit: Payer: Self-pay

## 2018-12-15 ENCOUNTER — Other Ambulatory Visit (INDEPENDENT_AMBULATORY_CARE_PROVIDER_SITE_OTHER): Payer: Self-pay | Admitting: Vascular Surgery

## 2018-12-15 DIAGNOSIS — I70221 Atherosclerosis of native arteries of extremities with rest pain, right leg: Secondary | ICD-10-CM

## 2018-12-15 LAB — GLUCOSE, CAPILLARY
Glucose-Capillary: 111 mg/dL — ABNORMAL HIGH (ref 70–99)
Glucose-Capillary: 116 mg/dL — ABNORMAL HIGH (ref 70–99)
Glucose-Capillary: 127 mg/dL — ABNORMAL HIGH (ref 70–99)
Glucose-Capillary: 130 mg/dL — ABNORMAL HIGH (ref 70–99)
Glucose-Capillary: 131 mg/dL — ABNORMAL HIGH (ref 70–99)
Glucose-Capillary: 136 mg/dL — ABNORMAL HIGH (ref 70–99)
Glucose-Capillary: 157 mg/dL — ABNORMAL HIGH (ref 70–99)
Glucose-Capillary: 162 mg/dL — ABNORMAL HIGH (ref 70–99)
Glucose-Capillary: 171 mg/dL — ABNORMAL HIGH (ref 70–99)
Glucose-Capillary: 176 mg/dL — ABNORMAL HIGH (ref 70–99)

## 2018-12-15 LAB — BASIC METABOLIC PANEL
Anion gap: 11 (ref 5–15)
Anion gap: 7 (ref 5–15)
Anion gap: 8 (ref 5–15)
Anion gap: 8 (ref 5–15)
Anion gap: 9 (ref 5–15)
BUN: 10 mg/dL (ref 8–23)
BUN: 11 mg/dL (ref 8–23)
BUN: 11 mg/dL (ref 8–23)
BUN: 11 mg/dL (ref 8–23)
BUN: 11 mg/dL (ref 8–23)
CO2: 23 mmol/L (ref 22–32)
CO2: 23 mmol/L (ref 22–32)
CO2: 24 mmol/L (ref 22–32)
CO2: 24 mmol/L (ref 22–32)
CO2: 24 mmol/L (ref 22–32)
Calcium: 9.1 mg/dL (ref 8.9–10.3)
Calcium: 9.1 mg/dL (ref 8.9–10.3)
Calcium: 9.2 mg/dL (ref 8.9–10.3)
Calcium: 9.4 mg/dL (ref 8.9–10.3)
Calcium: 9.6 mg/dL (ref 8.9–10.3)
Chloride: 106 mmol/L (ref 98–111)
Chloride: 108 mmol/L (ref 98–111)
Chloride: 109 mmol/L (ref 98–111)
Chloride: 109 mmol/L (ref 98–111)
Chloride: 109 mmol/L (ref 98–111)
Creatinine, Ser: 0.8 mg/dL (ref 0.44–1.00)
Creatinine, Ser: 0.83 mg/dL (ref 0.44–1.00)
Creatinine, Ser: 0.88 mg/dL (ref 0.44–1.00)
Creatinine, Ser: 0.9 mg/dL (ref 0.44–1.00)
Creatinine, Ser: 0.91 mg/dL (ref 0.44–1.00)
GFR calc Af Amer: >60 mL/min (ref >60)
GFR calc Af Amer: >60 mL/min (ref >60)
GFR calc Af Amer: >60 mL/min (ref >60)
GFR calc Af Amer: >60 mL/min (ref >60)
GFR calc Af Amer: >60 mL/min (ref >60)
GFR calc non Af Amer: >60 mL/min (ref >60)
GFR calc non Af Amer: >60 mL/min (ref >60)
GFR calc non Af Amer: >60 mL/min (ref >60)
GFR calc non Af Amer: >60 mL/min (ref >60)
GFR calc non Af Amer: >60 mL/min (ref >60)
Glucose, Bld: 113 mg/dL — ABNORMAL HIGH (ref 70–99)
Glucose, Bld: 130 mg/dL — ABNORMAL HIGH (ref 70–99)
Glucose, Bld: 133 mg/dL — ABNORMAL HIGH (ref 70–99)
Glucose, Bld: 149 mg/dL — ABNORMAL HIGH (ref 70–99)
Glucose, Bld: 155 mg/dL — ABNORMAL HIGH (ref 70–99)
Potassium: 3.3 mmol/L — ABNORMAL LOW (ref 3.5–5.1)
Potassium: 3.4 mmol/L — ABNORMAL LOW (ref 3.5–5.1)
Potassium: 3.4 mmol/L — ABNORMAL LOW (ref 3.5–5.1)
Potassium: 3.6 mmol/L (ref 3.5–5.1)
Potassium: 3.7 mmol/L (ref 3.5–5.1)
Sodium: 140 mmol/L (ref 135–145)
Sodium: 140 mmol/L (ref 135–145)
Sodium: 140 mmol/L (ref 135–145)
Sodium: 141 mmol/L (ref 135–145)
Sodium: 141 mmol/L (ref 135–145)

## 2018-12-15 LAB — URINALYSIS, COMPLETE (UACMP) WITH MICROSCOPIC
Bacteria, UA: NONE SEEN
Bilirubin Urine: NEGATIVE
Glucose, UA: 150 mg/dL — AB
Hgb urine dipstick: NEGATIVE
Ketones, ur: NEGATIVE mg/dL
Leukocytes,Ua: NEGATIVE
Nitrite: NEGATIVE
Protein, ur: 100 mg/dL — AB
Specific Gravity, Urine: 1.016 (ref 1.005–1.030)
pH: 6 (ref 5.0–8.0)

## 2018-12-15 LAB — APTT: aPTT: 116 seconds — ABNORMAL HIGH (ref 24–36)

## 2018-12-15 LAB — HEMOGLOBIN A1C
Hgb A1c MFr Bld: 7.9 % — ABNORMAL HIGH (ref 4.8–5.6)
Mean Plasma Glucose: 180.03 mg/dL

## 2018-12-15 LAB — PROTIME-INR
INR: 1.1 (ref 0.8–1.2)
Prothrombin Time: 13.8 seconds (ref 11.4–15.2)

## 2018-12-15 LAB — HEPARIN LEVEL (UNFRACTIONATED): Heparin Unfractionated: 0.94 IU/mL — ABNORMAL HIGH (ref 0.30–0.70)

## 2018-12-15 MED ORDER — SODIUM CHLORIDE 0.9% FLUSH
3.0000 mL | Freq: Two times a day (BID) | INTRAVENOUS | Status: DC
  Administered 2018-12-15 – 2019-01-05 (×28): 3 mL via INTRAVENOUS

## 2018-12-15 MED ORDER — SODIUM CHLORIDE 0.9 % IV SOLN: INTRAVENOUS | Status: DC

## 2018-12-15 MED ORDER — POTASSIUM CHLORIDE 10 MEQ/100ML IV SOLN
10.0000 meq | INTRAVENOUS | Status: AC
  Administered 2018-12-15 (×2): 10 meq via INTRAVENOUS
  Filled 2018-12-15 (×2): qty 100

## 2018-12-15 MED ORDER — DEXTROSE-NACL 5-0.45 % IV SOLN: INTRAVENOUS | Status: DC

## 2018-12-15 MED ORDER — INSULIN ASPART 100 UNIT/ML ~~LOC~~ SOLN
0.0000 [IU] | Freq: Three times a day (TID) | SUBCUTANEOUS | Status: DC
  Administered 2018-12-15: 4 [IU] via SUBCUTANEOUS
  Administered 2018-12-15: 3 [IU] via SUBCUTANEOUS
  Filled 2018-12-15 (×2): qty 1

## 2018-12-15 MED ORDER — HEPARIN (PORCINE) 25000 UT/250ML-% IV SOLN
900.0000 [IU]/h | INTRAVENOUS | Status: DC
  Administered 2018-12-15: 950 [IU]/h via INTRAVENOUS
  Administered 2018-12-16: 800 [IU]/h via INTRAVENOUS
  Administered 2018-12-17: 900 [IU]/h via INTRAVENOUS
  Filled 2018-12-15 (×3): qty 250

## 2018-12-15 MED ORDER — CIPROFLOXACIN IN D5W 400 MG/200ML IV SOLN
400.0000 mg | Freq: Two times a day (BID) | INTRAVENOUS | Status: DC
  Administered 2018-12-15 – 2018-12-17 (×5): 400 mg via INTRAVENOUS
  Filled 2018-12-15 (×8): qty 200

## 2018-12-15 MED ORDER — ATORVASTATIN CALCIUM 10 MG PO TABS
10.0000 mg | ORAL_TABLET | Freq: Every day | ORAL | Status: DC
  Administered 2018-12-15 – 2018-12-24 (×9): 10 mg via ORAL
  Filled 2018-12-15 (×9): qty 1

## 2018-12-15 MED ORDER — SODIUM CHLORIDE 0.9 % IV SOLN: INTRAVENOUS | Status: AC

## 2018-12-15 MED ORDER — METRONIDAZOLE IN NACL 5-0.79 MG/ML-% IV SOLN
500.0000 mg | Freq: Three times a day (TID) | INTRAVENOUS | Status: DC
  Administered 2018-12-16 – 2018-12-18 (×7): 500 mg via INTRAVENOUS
  Filled 2018-12-15 (×11): qty 100

## 2018-12-15 MED ORDER — HEPARIN BOLUS VIA INFUSION
3800.0000 [IU] | Freq: Once | INTRAVENOUS | Status: AC
  Administered 2018-12-15: 3800 [IU] via INTRAVENOUS
  Filled 2018-12-15: qty 3800

## 2018-12-15 MED ORDER — ACETAMINOPHEN 325 MG PO TABS
650.0000 mg | ORAL_TABLET | Freq: Four times a day (QID) | ORAL | Status: DC | PRN
  Administered 2018-12-15 – 2018-12-17 (×3): 650 mg via ORAL
  Filled 2018-12-15 (×3): qty 2

## 2018-12-15 MED ORDER — INSULIN ASPART 100 UNIT/ML ~~LOC~~ SOLN
0.0000 [IU] | Freq: Three times a day (TID) | SUBCUTANEOUS | Status: DC
  Administered 2018-12-16: 2 [IU] via SUBCUTANEOUS
  Administered 2018-12-16: 1 [IU] via SUBCUTANEOUS
  Administered 2018-12-17: 2 [IU] via SUBCUTANEOUS
  Administered 2018-12-17 – 2018-12-19 (×3): 1 [IU] via SUBCUTANEOUS
  Administered 2018-12-19: 2 [IU] via SUBCUTANEOUS
  Administered 2018-12-20: 1 [IU] via SUBCUTANEOUS
  Administered 2018-12-20: 3 [IU] via SUBCUTANEOUS
  Administered 2018-12-21: 1 [IU] via SUBCUTANEOUS
  Administered 2018-12-22: 2 [IU] via SUBCUTANEOUS
  Administered 2018-12-22: 3 [IU] via SUBCUTANEOUS
  Administered 2018-12-23 (×2): 2 [IU] via SUBCUTANEOUS
  Administered 2018-12-24 (×2): 1 [IU] via SUBCUTANEOUS
  Administered 2018-12-26 (×2): 2 [IU] via SUBCUTANEOUS
  Administered 2018-12-28: 5 [IU] via SUBCUTANEOUS
  Administered 2018-12-28: 2 [IU] via SUBCUTANEOUS
  Administered 2018-12-28: 3 [IU] via SUBCUTANEOUS
  Administered 2018-12-29 – 2018-12-30 (×4): 1 [IU] via SUBCUTANEOUS
  Administered 2018-12-30 – 2018-12-31 (×3): 2 [IU] via SUBCUTANEOUS
  Administered 2019-01-01 (×2): 1 [IU] via SUBCUTANEOUS
  Administered 2019-01-02: 2 [IU] via SUBCUTANEOUS
  Administered 2019-01-02: 1 [IU] via SUBCUTANEOUS
  Administered 2019-01-02: 2 [IU] via SUBCUTANEOUS
  Administered 2019-01-03 – 2019-01-04 (×3): 1 [IU] via SUBCUTANEOUS
  Administered 2019-01-04 – 2019-01-05 (×3): 2 [IU] via SUBCUTANEOUS
  Administered 2019-01-05: 1 [IU] via SUBCUTANEOUS
  Administered 2019-01-05 – 2019-01-06 (×2): 2 [IU] via SUBCUTANEOUS
  Filled 2018-12-15 (×41): qty 1

## 2018-12-15 MED ORDER — MORPHINE SULFATE (PF) 4 MG/ML IV SOLN
4.0000 mg | INTRAVENOUS | Status: DC | PRN
  Administered 2018-12-15 – 2018-12-18 (×12): 4 mg via INTRAVENOUS
  Filled 2018-12-15 (×11): qty 1

## 2018-12-15 MED ORDER — INSULIN ASPART 100 UNIT/ML ~~LOC~~ SOLN
0.0000 [IU] | Freq: Every day | SUBCUTANEOUS | Status: DC
  Administered 2018-12-17: 2 [IU] via SUBCUTANEOUS
  Administered 2018-12-27: 3 [IU] via SUBCUTANEOUS
  Administered 2018-12-31: 2 [IU] via SUBCUTANEOUS
  Filled 2018-12-15 (×3): qty 1

## 2018-12-15 MED ORDER — LINAGLIPTIN 5 MG PO TABS
5.0000 mg | ORAL_TABLET | Freq: Every day | ORAL | Status: DC
  Administered 2018-12-15 – 2019-01-06 (×19): 5 mg via ORAL
  Filled 2018-12-15 (×24): qty 1

## 2018-12-15 MED ORDER — SODIUM CHLORIDE 0.9 % IV SOLN
INTRAVENOUS | Status: DC
  Administered 2018-12-15: 05:00:00 via INTRAVENOUS

## 2018-12-15 MED ORDER — ENOXAPARIN SODIUM 40 MG/0.4ML ~~LOC~~ SOLN
40.0000 mg | SUBCUTANEOUS | Status: DC
  Administered 2018-12-15: 40 mg via SUBCUTANEOUS

## 2018-12-15 NOTE — Consult Note (Signed)
Pharmacy Antibiotic Note  Sylvia [name] is a 63 y.o. female admitted on 12/14/2018 with acute gastroenteritis.  Pharmacy has been consulted for ciprofloxacin dosing.  Plan: Begin ciprofloxacin 400 mg IV every 12 hours  Height: 5\' 7"  (170.2 cm) Weight: 142 lb 14.4 oz (64.8 kg) IBW/kg (Calculated) : 61.6  Temp (24hrs), Avg:98.8 F (37.1 C), Min:98.2 F (36.8 C), Max:99.4 F (37.4 C)  Recent Labs  Lab 12/14/18 1240 12/14/18 1241  12/15/18 0028 12/15/18 0304 12/15/18 0713 12/15/18 1031 12/15/18 1354  WBC 15.4*  --   --   --   --   --   --   --   CREATININE 1.04*  --    < > 0.83 0.90 0.88 0.91 0.80  LATICACIDVEN  --  1.7  --   --   --   --   --   --    < > = values in this interval not displayed.    Estimated Creatinine Clearance: 70 mL/min (by C-G formula based on SCr of 0.8 mg/dL).    Antimicrobials this admission: ciprofloxacin 8/28 >>  metronidazole 8/28 >>   Microbiology results: 8/27 SARS-CoV-2: negative   Thank you for allowing pharmacy to be a part of this patient's care.  Dallie Piles, PharmD 12/15/2018 6:05 PM

## 2018-12-15 NOTE — ED Notes (Signed)
Attempted to call report. Told to call back in 10 minutes. 

## 2018-12-15 NOTE — Telephone Encounter (Signed)
Please attempt to contact patient again to schedule as advised below. AS, CMA

## 2018-12-15 NOTE — Progress Notes (Signed)
[employer] - [place] at [facility]   PATIENT NAME: Sylvia [name]    MR#:  [id number]  DATE OF BIRTH:  04/16/1956  SUBJECTIVE:  CHIEF COMPLAINT:   Chief Complaint  Patient presents with  . Abdominal Pain   Came with abdominal pain, nausea.  Noted to be in DKA. She is also not compliant with her medication and follow-ups with vascular clinic.  She had questionable GI bleed/dark stool and her Coumadin was stopped and she was advised to bring her stool to the clinic for check but she never brought back. Today she was asking food to eat.  REVIEW OF SYSTEMS:  CONSTITUTIONAL: No fever, fatigue or weakness.  EYES: No blurred or double vision.  EARS, NOSE, AND THROAT: No tinnitus or ear pain.  RESPIRATORY: No cough, shortness of breath, wheezing or hemoptysis.  CARDIOVASCULAR: No chest pain, orthopnea, edema.  GASTROINTESTINAL: No nausea, vomiting, diarrhea or abdominal pain.  GENITOURINARY: No dysuria, hematuria.  ENDOCRINE: No polyuria, nocturia,  HEMATOLOGY: No anemia, easy bruising or bleeding SKIN: No rash or lesion. MUSCULOSKELETAL: No joint pain or arthritis.   NEUROLOGIC: No tingling, numbness, weakness.  PSYCHIATRY: No anxiety or depression.   ROS  DRUG ALLERGIES:   Allergies  Allergen Reactions  . Penicillins Hives    Has patient had a PCN reaction causing immediate rash, facial/tongue/throat swelling, SOB or lightheadedness with hypotension: Yes Has patient had a PCN reaction causing severe rash involving mucus membranes or skin necrosis: No Has patient had a PCN reaction that required hospitalization: No Has patient had a PCN reaction occurring within the last 10 years: No If all of the above answers are "NO", then may proceed with Cephalosporin use.  . Tramadol Itching    VITALS:  Blood pressure 137/71, pulse 78, temperature 98.2 F (36.8 C), temperature source Oral, resp. rate 18, height 5\' 7"  (1.702 m), weight 64.8 kg, SpO2 99 %.  PHYSICAL  EXAMINATION:  GENERAL:  63 y.o.-year-old patient lying in the bed with no acute distress.  EYES: Pupils equal, round, reactive to light and accommodation. No scleral icterus. Extraocular muscles intact.  HEENT: Head atraumatic, normocephalic. Oropharynx and nasopharynx clear.  NECK:  Supple, no jugular venous distention. No thyroid enlargement, no tenderness.  LUNGS: Normal breath sounds bilaterally, no wheezing, rales,rhonchi or crepitation. No use of accessory muscles of respiration.  CARDIOVASCULAR: S1, S2 normal. No murmurs, rubs, or gallops.  ABDOMEN: Soft, nontender, nondistended. Bowel sounds present. No organomegaly or mass.  EXTREMITIES: No pedal edema, cyanosis, or clubbing.  NEUROLOGIC: Cranial nerves II through XII are intact. Muscle strength 4/5 in all extremities. Sensation intact. Gait not checked.  PSYCHIATRIC: The patient is alert and oriented x 3.  SKIN: No obvious rash, lesion, or ulcer.   Physical Exam LABORATORY PANEL:   CBC Recent Labs  Lab 12/14/18 1240  WBC 15.4*  HGB 11.1*  HCT 32.1*  PLT 312   ------------------------------------------------------------------------------------------------------------------  Chemistries  Recent Labs  Lab 12/14/18 1240  12/15/18 1354  NA 141   < > 140  K 3.8   < > 3.4*  CL 104   < > 109  CO2 20*   < > 24  GLUCOSE 291*   < > 130*  BUN 14   < > 11  CREATININE 1.04*   < > 0.80  CALCIUM 10.8*   < > 9.1  AST 17  --   --   ALT 15  --   --   ALKPHOS 86  --   --  BILITOT 1.2  --   --    < > = values in this interval not displayed.   ------------------------------------------------------------------------------------------------------------------  Cardiac Enzymes No results for input(s): TROPONINI in the last 168 hours. ------------------------------------------------------------------------------------------------------------------  RADIOLOGY:  Ct Abdomen Pelvis W Contrast  Result Date: 12/14/2018 CLINICAL DATA:   Abdo pain with nausea and vomiting EXAM: CT ABDOMEN AND PELVIS WITH CONTRAST TECHNIQUE: Multidetector CT imaging of the abdomen and pelvis was performed using the standard protocol following bolus administration of intravenous contrast. CONTRAST:  100mL OMNIPAQUE IOHEXOL 300 MG/ML  SOLN COMPARISON:  June 13, 2018 FINDINGS: Lower chest: There is bibasilar atelectasis. There is no lung base edema or consolidation. Hepatobiliary: There is hepatic steatosis. No focal liver lesions are evident. The gallbladder is absent. There is no appreciable biliary duct dilatation. Pancreas: There is no pancreatic mass or inflammatory focus. Spleen: No splenic lesions are evident. Adrenals/Urinary Tract: Adrenals bilaterally appear unremarkable. There is a 7 x 5 mm cyst in the periphery of the mid right kidney. There is no evident hydronephrosis on either side. There is no evident renal or ureteral calculus on either side. Urinary bladder is midline with wall thickness within normal limits. Stomach/Bowel: Most small bowel loops are fluid-filled. There is no appreciable bowel wall or mesenteric thickening. No evident bowel obstruction. There are cecal diverticula without inflammation. The terminal ileum appears unremarkable. There is no evident free air or portal venous air. Vascular/Lymphatic: There is extensive aortic and iliac artery atherosclerosis. There is extensive plaque in the mid aorta which appears to cause subtotal occlusion at the level of the lower poles of the kidneys. Distal aorta is mildly ectatic with a maximum transverse diameter of 2.3 x 2.3 cm. No frank aneurysm. No adenopathy is evident in the abdomen or pelvis. Reproductive: Uterus is absent.  No evident pelvic mass. Other: Appendix appears unremarkable. No evident abscess or ascites in the abdomen or pelvis. Musculoskeletal: There is degenerative change in each hip joint and lower lumbar spine. There are no blastic or lytic bone lesions. There is no  intramuscular or abdominal wall lesion. IMPRESSION: 1. Extensive aortoiliac atherosclerosis. Extensive plaque in particular is noted in the aorta with subtotal occlusion in the mid aorta at the level of the lower poles of the kidneys. 2. Most small bowel loops are fluid-filled. This is a finding that may be indicative of a degree of ileus or enteritis. No bowel obstruction evident. There are diverticula arising from the cecum without diverticulitis. No diverticulitis seen elsewhere. 3.  No abscess in the abdomen or pelvis.  Appendix appears normal. 4. No renal or ureteral calculi. No hydronephrosis. Urinary bladder wall thickness within normal limits. 5.  Hepatic steatosis.  Gallbladder absent. 6.  Uterus absent. Electronically Signed   By: Bretta BangWilliam  Woodruff III M.D.   On: 12/14/2018 14:17   [place] Venous Img Lower Bilateral  Result Date: 12/15/2018 CLINICAL DATA:  Bilateral lower extremity edema. History of previous DVT. Patient recently came off of anticoagulation. Evaluate for acute or chronic DVT. EXAM: BILATERAL LOWER EXTREMITY VENOUS DOPPLER ULTRASOUND TECHNIQUE: Gray-scale sonography with graded compression, as well as color Doppler and duplex ultrasound were performed to evaluate the lower extremity deep venous systems from the level of the common femoral vein and including the common femoral, femoral, profunda femoral, popliteal and calf veins including the posterior tibial, peroneal and gastrocnemius veins when visible. The superficial great saphenous vein was also interrogated. Spectral Doppler was utilized to evaluate flow at rest and with distal augmentation maneuvers in the  common femoral, femoral and popliteal veins. COMPARISON:  CT abdomen and pelvis-12/14/2018; 06/13/2018 FINDINGS: RIGHT LOWER EXTREMITY Common Femoral Vein: No evidence of thrombus. Normal compressibility, respiratory phasicity and response to augmentation. Saphenofemoral Junction: No evidence of thrombus. Normal compressibility and  flow on color Doppler imaging. Profunda Femoral Vein: No evidence of thrombus. Normal compressibility and flow on color Doppler imaging. Femoral Vein: No evidence of thrombus. Normal compressibility, respiratory phasicity and response to augmentation. Popliteal Vein: No evidence of thrombus. Normal compressibility, respiratory phasicity and response to augmentation. Calf Veins: No evidence of thrombus. Normal compressibility and flow on color Doppler imaging. Superficial Great Saphenous Vein: No evidence of thrombus. Normal compressibility. Venous Reflux:  None. Other Findings: Previously stented right superficial femoral artery appears occluded throughout its imaged course (representative images 31 through 34) with atretic reconstitution of the right popliteal artery (image 35). LEFT LOWER EXTREMITY Common Femoral Vein: No evidence of thrombus. Normal compressibility, respiratory phasicity and response to augmentation. Saphenofemoral Junction: No evidence of thrombus. Normal compressibility and flow on color Doppler imaging. Profunda Femoral Vein: No evidence of thrombus. Normal compressibility and flow on color Doppler imaging. Femoral Vein: No evidence of thrombus. Normal compressibility, respiratory phasicity and response to augmentation. Popliteal Vein: No evidence of thrombus. Normal compressibility, respiratory phasicity and response to augmentation. Calf Veins: No evidence of thrombus. Normal compressibility and flow on color Doppler imaging. Superficial Great Saphenous Vein: No evidence of thrombus. Normal compressibility. Venous Reflux:  None. Other Findings: Previously stented left superficial femoral artery appears occluded throughout its imaged courses (images 65 through 67) with atretic with atretic reconstitution of left popliteal artery (image 69) IMPRESSION: 1. No evidence of DVT within either lower extremity. 2. 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 Correlation for symptoms of PAD is advised. Further evaluation with CTA run-off could be as indicated. These results will be called to the ordering clinician or representative by the Radiologist Assistant, and communication documented in the PACS or zVision Dashboard. Electronically Signed   By: Simonne Come M.D.   On: 12/15/2018 13:54    ASSESSMENT AND PLAN:   Active Problems:   DKA (diabetic ketoacidoses) (HCC)  DKA with history of diabetes 2. Resolved in short time with IV fluid, DKA protocol.  N.p.o. with IV fluid support.  BMP every 4 hours. Improved so will now start back on Januvia and he is comfort care IV fluid and keep on sliding scale coverage.  Acute gastroenteritis.  IV fluid support, Zofran as needed, supportive care.  Leukocytosis.  follow-up CBC is up but urinalysis is negative.  Call with test is negative.     CT scan reports questionable enteritis and patient's WBCs are worsening so I will start on antibiotics Cipro plus Flagyl.  Extensive aortoiliac atherosclerosis.  Vascular consult.    Plan for angiogram.  History of DVT.  The patient was on Coumadin, off Coumadin for 2 weeks due to recent GI bleeding.  Vascular suggested to start on heparin drip to check and check stool for guaiac if she has any question able bleeds.  Hypertension.  Continue home hypertension medication after resuming diet. COPD.  Stable.    All the records are reviewed and case discussed with Care Management/Social Workerr. Management plans discussed with the patient, family and they are in agreement.  CODE STATUS: Full  TOTAL TIME TAKING CARE OF THIS PATIENT: 35 minutes.     POSSIBLE D/C IN 1-2 DAYS, DEPENDING ON CLINICAL CONDITION.   Altamese Dilling M.D on  12/15/2018   Between 7am to 6pm - Pager - [phone]  After 6pm go to [email or web address] - password [email or web address] [facility]  Office  [phone]  CC: Primary care physician; Center, [place]  Note: This dictation was prepared with Diplomatic Services operational officer dictation along with smaller phrase technology. Any transcriptional errors that result from this process are unintentional.

## 2018-12-15 NOTE — Consult Note (Signed)
ANTICOAGULATION CONSULT NOTE  Pharmacy Consult for Heparin Indication: VTE treatment  Patient Measurements: Height: 5\' 7"  (170.2 cm) Weight: 142 lb 14.4 oz (64.8 kg) IBW/kg (Calculated) : 61.6 Heparin Dosing Weight: 64.8 kg  Vital Signs: Temp: 98.7 F (37.1 C) (08/28 0856) Temp Source: Oral (08/28 0856) BP: 135/75 (08/28 0856) Pulse Rate: 70 (08/28 0856)  Labs: Recent Labs    12/14/18 1240  12/15/18 0713 12/15/18 1031 12/15/18 1354  HGB 11.1*  --   --   --   --   HCT 32.1*  --   --   --   --   PLT 312  --   --   --   --   APTT  --   --   --   --  116*  LABPROT  --   --   --   --  13.8  INR  --   --   --   --  1.1  CREATININE 1.04*   < > 0.88 0.91 0.80   < > = values in this interval not displayed.    Estimated Creatinine Clearance: 70 mL/min (by C-G formula based on SCr of 0.8 mg/dL).   Medical History: Past Medical History:  Diagnosis Date  . Anxiety    h/o  . COPD (chronic obstructive pulmonary disease) ([place])   . Diabetes mellitus without complication ([place])   . GERD (gastroesophageal reflux disease)   . Hypertension    bp under control-off meds since 2019    Medications:  Medications Prior to Admission  Medication Sig Dispense Refill Last Dose  . albuterol (PROVENTIL HFA;VENTOLIN HFA) 108 (90 Base) MCG/ACT inhaler Inhale 2 puffs into the lungs every 6 (six) hours as needed for wheezing or shortness of breath. 1 Inhaler 2 prn at prn  . alum & mag hydroxide-simeth (MAALOX/MYLANTA) 200-200-20 MG/5ML suspension Take 30 mLs by mouth every 4 (four) hours as needed for indigestion or heartburn. 355 mL 0 prn at prn  . atorvastatin (LIPITOR) 10 MG tablet Take 1 tablet (10 mg total) by mouth daily. 90 tablet 3 12/13/2018 at Unknown time  . Biotin 1000 MCG tablet Take 1,000 tablets by mouth daily.    12/13/2018 at Unknown time  . cetirizine (ZYRTEC) 10 MG tablet Take 10 mg by mouth daily.   12/13/2018 at Unknown time  . cholecalciferol (VITAMIN D3) 25 MCG (1000 UT)  tablet Take 1,000 Units by mouth daily.   12/13/2018 at Unknown time  . iron polysaccharides (NIFEREX) 150 MG capsule Take 1 capsule (150 mg total) by mouth daily. 30 capsule 3 12/13/2018 at Unknown time  . Multiple Vitamins-Minerals (WOMENS MULTIVITAMIN PO) Take 1 tablet by mouth daily.   12/13/2018 at Unknown time  . pantoprazole (PROTONIX) 40 MG tablet Take 1 tablet (40 mg total) by mouth daily. 30 tablet 0 12/13/2018 at Unknown time  . sitaGLIPtin (JANUVIA) 100 MG tablet Take 100 mg by mouth daily.   12/13/2018 at Unknown time  . vitamin C (ASCORBIC ACID) 500 MG tablet Take 500 mg by mouth daily.   12/13/2018 at Unknown time  . warfarin (COUMADIN) 5 MG tablet Take 1 tablet (5 mg total) by mouth daily at 6 PM. 30 tablet 5 12/13/2018 at Unknown time   Scheduled:  . atorvastatin  10 mg Oral Daily  . insulin aspart  0-20 Units Subcutaneous TID WC  . linagliptin  5 mg Oral Daily  . sodium chloride flush  3 mL Intravenous Q12H   Infusions:  . sodium chloride  75 mL/hr at 12/15/18 1050  . [START ON 12/18/2018] sodium chloride    . heparin 950 Units/hr (12/15/18 1248)   PRN: morphine injection Anti-infectives (From admission, onward)   None      Assessment: History of DVT.  The patient was on Coumadin, off Coumadin for 2 weeks due to recent GI bleeding. Vascular suggested to start heparin and holding coumadin for now. Pt took warfarin 5 mg daily PTA.   Goal of Therapy:  Heparin level 0.3-0.7 units/ml Monitor platelets by anticoagulation protocol: Yes   Heparin Course: 8/28 initiation 3800 units bolus, then 950 units/hr 8/28 1926 HL 0.94: dec to 800 units/hr   Plan:   decrease heparin infusion to 800 units/hr  Check anti-Xa level in 6 hours and daily while on heparin  Continue to monitor H&H and platelets: CBC in am  Lowella Bandy, PharmD 12/15/2018,3:17 PM

## 2018-12-15 NOTE — Progress Notes (Addendum)
Pt complaining of a headache 6/10. Notify prime and talked to Gardiner Barefoot NP and order ultram 50 mg once oral. Will continue to monitor.  Update 2153: Tramadol was one of pt allergy listed on chart. Notify prime. Will continue to monitor.  Update 2107:  Levada Dy ordered tylenol 650 mg every 6 hours PRN. Will continue to monitor.  Update 0402: Pt is feeling nauseous. Notify prime. Will continue to monitor.  Update [phone]: talked to Dr. Sidney Ace and ordered every 6 hours Zofran 4 mg IV PRN.

## 2018-12-15 NOTE — Consult Note (Signed)
[facility] Vascular Consult Note  MRN : [id number]  Sylvia SensingRoberta [name] is a 63 y.o. (11/06/1955) female who presents with chief complaint of  Chief Complaint  Patient presents with  . Abdominal Pain   History of Present Illness:  The patient is a 63 year old female with multiple medical issues (see below) well-known to our service who presented [facility] with abdominal pain.  Patient endorses a history of progressively worsening abdominal pain associated with nausea.  Patient was found to be in DKA in the emergency department.  She was admitted and started on insulin drip.  The patient has a past medical history of peripheral artery disease to the bilateral lower extremity requiring endovascular intervention.  Unfortunately, the patient has been noncompliant with her Coumadin.  The patient has struggled with her diet choices eating large amounts of green leafy vegetables subsequently affecting her INR.  The patient was told by family friend that dark stools can indicate a GI bleed and after 1 bowel movement which was dark in color the patient decided to stop Coumadin.  She was advised to contact her primary care physician to undergo further testing and was given a take-home fecal occult blood test.  The patient did not complete the test because she "did not want to touch or poop".  Patient was on other blood thinners in the past however her insurance did not cover them and she was transitioned over to Coumadin.    Patient endorses a history of bilateral lower extremity claudication.  Right lower extremity worse than left.  Denies any rest pain or ulcer formation to the bilateral legs  Vascular surgery was consulted by Dr. Imogene Burnhen for further recommendations.  Current Facility-Administered Medications  Medication Dose Route Frequency Provider Last Rate Last Dose  . 0.9 %  sodium chloride infusion   Intravenous Continuous Altamese DillingVachhani, Vaibhavkumar, MD 75  mL/hr at 12/15/18 1050    . atorvastatin (LIPITOR) tablet 10 mg  10 mg Oral Daily Shaune Pollackhen, Qing, MD      . heparin ADULT infusion 100 units/mL (25000 units/26950mL sodium chloride 0.45%)  950 Units/hr Intravenous Continuous Altamese DillingVachhani, Vaibhavkumar, MD 9.5 mL/hr at 12/15/18 1248 950 Units/hr at 12/15/18 1248  . insulin aspart (novoLOG) injection 0-20 Units  0-20 Units Subcutaneous TID WC Mansy, Vernetta HoneyJan A, MD   4 Units at 12/15/18 0859  . linagliptin (TRADJENTA) tablet 5 mg  5 mg Oral Daily Altamese DillingVachhani, Vaibhavkumar, MD   5 mg at 12/15/18 1200  . morphine 4 MG/ML injection 4 mg  4 mg Intravenous Q4H PRN Shaune Pollackhen, Qing, MD   4 mg at 12/15/18 [id number]  . sodium chloride flush (NS) 0.9 % injection 3 mL  3 mL Intravenous Q12H Mansy, Vernetta HoneyJan A, MD   3 mL at 12/15/18 [id number]   Past Medical History:  Diagnosis Date  . Anxiety    h/o  . COPD (chronic obstructive pulmonary disease) (HCC)   . Diabetes mellitus without complication (HCC)   . GERD (gastroesophageal reflux disease)   . Hypertension    bp under control-off meds since 2019   Past Surgical History:  Procedure Laterality Date  . ABDOMINAL HYSTERECTOMY    . CHOLECYSTECTOMY    . ESOPHAGOGASTRODUODENOSCOPY (EGD) WITH PROPOFOL N/A 06/15/2018   Procedure: ESOPHAGOGASTRODUODENOSCOPY (EGD) WITH PROPOFOL;  Surgeon: Toledo, Boykin Nearingeodoro K, MD;  Location: [facility] ENDOSCOPY;  Service: Gastroenterology;  Laterality: N/A;  . LOWER EXTREMITY ANGIOGRAPHY Right 08/21/2018   Procedure: LOWER EXTREMITY ANGIOGRAPHY;  Surgeon: Annice Needyew, Jason S, MD;  [facility];  Service: Cardiovascular;  Laterality: Right;  . LOWER EXTREMITY ANGIOGRAPHY Left 08/28/2018   Procedure: LOWER EXTREMITY ANGIOGRAPHY;  Surgeon: Annice Needy, MD;  Location: [facility] INVASIVE CV LAB;  Service: Cardiovascular;  Laterality: Left;  . LOWER EXTREMITY ANGIOGRAPHY Right 08/28/2018   Procedure: Lower Extremity Angiography;  Surgeon: Annice Needy, MD;  Location: [facility] INVASIVE CV LAB;  Service: Cardiovascular;  Laterality:  Right;   Social History Social History   Tobacco Use  . Smoking status: Former Smoker    Packs/day: 0.25    Years: 45.00    Pack years: 11.25    Types: Cigarettes  . Smokeless tobacco: Never Used  . Tobacco comment: down to 1 cig daily  Substance Use Topics  . Alcohol use: No    Frequency: Never  . Drug use: Never   Family History Family History  Problem Relation Age of Onset  . Breast cancer Mother 29  Denies any family history of peripheral artery disease, venous disease or renal disease.  Allergies  Allergen Reactions  . Penicillins Hives    Has patient had a PCN reaction causing immediate rash, facial/tongue/throat swelling, SOB or lightheadedness with hypotension: Yes Has patient had a PCN reaction causing severe rash involving mucus membranes or skin necrosis: No Has patient had a PCN reaction that required hospitalization: No Has patient had a PCN reaction occurring within the last 10 years: No If all of the above answers are "NO", then may proceed with Cephalosporin use.  . Tramadol Itching   REVIEW OF SYSTEMS (Negative unless checked)  Constitutional: [] Weight loss  [] Fever  [] Chills Cardiac: [] Chest pain   [] Chest pressure   [] Palpitations   [] Shortness of breath when laying flat   [] Shortness of breath at rest   [] Shortness of breath with exertion. Vascular:  [x] Pain in legs with walking   [] Pain in legs at rest   [] Pain in legs when laying flat   [x] Claudication   [] Pain in feet when walking  [] Pain in feet at rest  [] Pain in feet when laying flat   [] History of DVT   [x] Phlebitis   [x] Swelling in legs   [x] Varicose veins   [] Non-healing ulcers Pulmonary:   [] Uses home oxygen   [] Productive cough   [] Hemoptysis   [] Wheeze  [] COPD   [] Asthma Neurologic:  [] Dizziness  [] Blackouts   [] Seizures   [] History of stroke   [] History of TIA  [] Aphasia   [] Temporary blindness   [] Dysphagia   [] Weakness or numbness in arms   [] Weakness or numbness in legs Musculoskeletal:   [] Arthritis   [] Joint swelling   [] Joint pain   [] Low back pain Hematologic:  [] Easy bruising  [] Easy bleeding   [] Hypercoagulable state   [] Anemic  [] Hepatitis Gastrointestinal:  [] Blood in stool   [] Vomiting blood  [] Gastroesophageal reflux/heartburn   [] Difficulty swallowing. Genitourinary:  [] Chronic kidney disease   [] Difficult urination  [] Frequent urination  [] Burning with urination   [] Blood in urine Skin:  [] Rashes   [] Ulcers   [] Wounds Psychological:  [] History of anxiety   []  History of major depression.  Physical Examination  Vitals:   12/15/18 0130 12/15/18 0227 12/15/18 0624 12/15/18 0856  BP: (!) 117/55 (!) 127/92 120/63 135/75  Pulse: 94 (!) 106 80 70  Resp: 13 18 18 16   Temp:  99.4 F (37.4 C) 98.7 F (37.1 C) 98.7 F (37.1 C)  TempSrc:  Oral Oral Oral  SpO2: 99% 100% 96% 99%  Weight:  64.8 kg    Height:  Body mass index is 22.38 kg/m. Gen:  WD/WN, NAD Head: [place]/AT, No temporalis wasting. Prominent temp pulse not noted. Ear/Nose/Throat: Hearing grossly intact, nares w/o erythema or drainage, oropharynx w/o Erythema/Exudate Eyes: Sclera non-icteric, conjunctiva clear Neck: Trachea midline.  No JVD.  Pulmonary:  Good air movement, respirations not labored, equal bilaterally.  Cardiac: RRR, normal S1, S2. Vascular:  Vessel Right Left  Radial Palpable Palpable  Ulnar Palpable Palpable  Brachial Palpable Palpable  Carotid Palpable, without bruit Palpable, without bruit  Aorta Not palpable N/A  Femoral Palpable Palpable  Popliteal Palpable Palpable  PT Faint Faint  DP Faint Faint   Right lower extremity: Thigh soft. Calf soft extremities warm distally to toes.  Hard to palpate/faint pedal pulses.  No ulcerations or wounds noted.  Motor/sensory is intact.  Left lower extremity: Thigh soft. Calf soft extremities warm distally to toes.  Hard to palpate/faint pedal pulses.  No ulcerations or wounds noted.  Motor/sensory is intact.  Gastrointestinal: soft,  non-tender/non-distended. No guarding/reflex.  Musculoskeletal: M/S 5/5 throughout.  Extremities without ischemic changes.  No deformity or atrophy. No edema. Neurologic: Sensation grossly intact in extremities.  Symmetrical.  Speech is fluent. Motor exam as listed above. Psychiatric: Judgment intact, Mood & affect appropriate for pt's clinical situation. Dermatologic: No rashes or ulcers noted.  No cellulitis or open wounds. Lymph : No Cervical, Axillary, or Inguinal lymphadenopathy.  CBC Lab Results  Component Value Date   WBC 15.4 (H) 12/14/2018   HGB 11.1 (L) 12/14/2018   HCT 32.1 (L) 12/14/2018   MCV 81.5 12/14/2018   PLT 312 12/14/2018   BMET    Component Value Date/Time   NA 140 12/15/2018 1354   K 3.4 (L) 12/15/2018 1354   CL 109 12/15/2018 1354   CO2 24 12/15/2018 1354   GLUCOSE 130 (H) 12/15/2018 1354   BUN 11 12/15/2018 1354   CREATININE 0.80 12/15/2018 1354   CALCIUM 9.1 12/15/2018 1354   GFRNONAA >60 12/15/2018 1354   GFRAA >60 12/15/2018 1354   Estimated Creatinine Clearance: 70 mL/min (by C-G formula based on SCr of 0.8 mg/dL).  COAG Lab Results  Component Value Date   INR 1.1 12/15/2018   INR 1.9 (H) 11/27/2018   INR 2.9 (H) 11/13/2018   Radiology Ct Abdomen Pelvis W Contrast  Result Date: 12/14/2018 CLINICAL DATA:  Abdo pain with nausea and vomiting EXAM: CT ABDOMEN AND PELVIS WITH CONTRAST TECHNIQUE: Multidetector CT imaging of the abdomen and pelvis was performed using the standard protocol following bolus administration of intravenous contrast. CONTRAST:  OMNIPAQUE IOHEXOL 300 MG/ML  SOLN COMPARISON:  June 13, 2018 FINDINGS: Lower chest: There is bibasilar atelectasis. There is no lung base edema or consolidation. Hepatobiliary: There is hepatic steatosis. No focal liver lesions are evident. The gallbladder is absent. There is no appreciable biliary duct dilatation. Pancreas: There is no pancreatic mass or inflammatory focus. Spleen: No splenic  lesions are evident. Adrenals/Urinary Tract: Adrenals bilaterally appear unremarkable. There is a 7 x 5 mm cyst in the periphery of the mid right kidney. There is no evident hydronephrosis on either side. There is no evident renal or ureteral calculus on either side. Urinary bladder is midline with wall thickness within normal limits. Stomach/Bowel: Most small bowel loops are fluid-filled. There is no appreciable bowel wall or mesenteric thickening. No evident bowel obstruction. There are cecal diverticula without inflammation. The terminal ileum appears unremarkable. There is no evident free air or portal venous air. Vascular/Lymphatic: There is extensive aortic and iliac  artery atherosclerosis. There is extensive plaque in the mid aorta which appears to cause subtotal occlusion at the level of the lower poles of the kidneys. Distal aorta is mildly ectatic with a maximum transverse diameter of 2.3 x 2.3 cm. No frank aneurysm. No adenopathy is evident in the abdomen or pelvis. Reproductive: Uterus is absent.  No evident pelvic mass. Other: Appendix appears unremarkable. No evident abscess or ascites in the abdomen or pelvis. Musculoskeletal: There is degenerative change in each hip joint and lower lumbar spine. There are no blastic or lytic bone lesions. There is no intramuscular or abdominal wall lesion. IMPRESSION: 1. Extensive aortoiliac atherosclerosis. Extensive plaque in particular is noted in the aorta with subtotal occlusion in the mid aorta at the level of the lower poles of the kidneys. 2. Most small bowel loops are fluid-filled. This is a finding that may be indicative of a degree of ileus or enteritis. No bowel obstruction evident. There are diverticula arising from the cecum without diverticulitis. No diverticulitis seen elsewhere. 3.  No abscess in the abdomen or pelvis.  Appendix appears normal. 4. No renal or ureteral calculi. No hydronephrosis. Urinary bladder wall thickness within normal limits. 5.   Hepatic steatosis.  Gallbladder absent. 6.  Uterus absent. Electronically Signed   By: Bretta BangWilliam  Woodruff III M.D.   On: 12/14/2018 14:17   [place] Venous Img Lower Bilateral  Result Date: 12/15/2018 CLINICAL DATA:  Bilateral lower extremity edema. History of previous DVT. Patient recently came off of anticoagulation. Evaluate for acute or chronic DVT. EXAM: BILATERAL LOWER EXTREMITY VENOUS DOPPLER ULTRASOUND TECHNIQUE: Gray-scale sonography with graded compression, as well as color Doppler and duplex ultrasound were performed to evaluate the lower extremity deep venous systems from the level of the common femoral vein and including the common femoral, femoral, profunda femoral, popliteal and calf veins including the posterior tibial, peroneal and gastrocnemius veins when visible. The superficial great saphenous vein was also interrogated. Spectral Doppler was utilized to evaluate flow at rest and with distal augmentation maneuvers in the common femoral, femoral and popliteal veins. COMPARISON:  CT abdomen and pelvis-12/14/2018; 06/13/2018 FINDINGS: RIGHT LOWER EXTREMITY Common Femoral Vein: No evidence of thrombus. Normal compressibility, respiratory phasicity and response to augmentation. Saphenofemoral Junction: No evidence of thrombus. Normal compressibility and flow on color Doppler imaging. Profunda Femoral Vein: No evidence of thrombus. Normal compressibility and flow on color Doppler imaging. Femoral Vein: No evidence of thrombus. Normal compressibility, respiratory phasicity and response to augmentation. Popliteal Vein: No evidence of thrombus. Normal compressibility, respiratory phasicity and response to augmentation. Calf Veins: No evidence of thrombus. Normal compressibility and flow on color Doppler imaging. Superficial Great Saphenous Vein: No evidence of thrombus. Normal compressibility. Venous Reflux:  None. Other Findings: Previously stented right superficial femoral artery appears occluded  throughout its imaged course (representative images 31 through 34) with atretic reconstitution of the right popliteal artery (image 35). LEFT LOWER EXTREMITY Common Femoral Vein: No evidence of thrombus. Normal compressibility, respiratory phasicity and response to augmentation. Saphenofemoral Junction: No evidence of thrombus. Normal compressibility and flow on color Doppler imaging. Profunda Femoral Vein: No evidence of thrombus. Normal compressibility and flow on color Doppler imaging. Femoral Vein: No evidence of thrombus. Normal compressibility, respiratory phasicity and response to augmentation. Popliteal Vein: No evidence of thrombus. Normal compressibility, respiratory phasicity and response to augmentation. Calf Veins: No evidence of thrombus. Normal compressibility and flow on color Doppler imaging. Superficial Great Saphenous Vein: No evidence of thrombus. Normal compressibility. Venous Reflux:  None. Other  Findings: Previously stented left superficial femoral artery appears occluded throughout its imaged courses (images 65 through 55) with atretic with atretic reconstitution of left popliteal artery (image 69) IMPRESSION: 1. No evidence of DVT within either lower extremity. 2. 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 Correlation for symptoms of PAD is advised. Further evaluation with CTA run-off could be as indicated. These results will be called to the ordering clinician or representative by the Radiologist Assistant, and communication documented in the PACS or zVision Dashboard. Electronically Signed   By: Sandi Mariscal M.D.   On: 12/15/2018 13:54   Dg Toe 5th Right  Result Date: 11/22/2018 CLINICAL DATA:  Right toe pain after blunt trauma today. Previous injury on 08/28/2018. EXAM: RIGHT FIFTH TOE COMPARISON:  Radiographs dated 11/11/2018 FINDINGS: There is no evidence of fracture or dislocation. There is no evidence of arthropathy or other  focal bone abnormality. Soft tissue swelling of the toe. The nail appears partially avulsed. IMPRESSION: No acute osseous abnormality. No radiographic evidence of osteomyelitis. Partial avulsion of the nail. Electronically Signed   By: Lorriane Shire M.D.   On: 11/22/2018 20:00   Assessment/Plan The patient is a 63 year old female multiple medical issues admitted for DKA however does have a history of peripheral artery disease and on duplex was found to have bilateral occluded provisional femoral arteries 1.  Peripheral artery disease: Patient with a known history of peripheral artery disease requiring endovascular intervention in the past.  Patient not compliant with her Coumadin.  Patient has been initiated on heparin with a plan to transition to Coumadin.  The patient is symptomatic with bilateral claudication that interferes with her ability to function on daily basis.  Recommend undergoing a right lower extremity angiogram with possible intervention on Monday with Dr. Lucky Cowboy.  Procedure, risks and benefits explained to the patient.  All questions answered.  Patient was to proceed.  The patient will also need to have intervention on the left however this can be done as an outpatient. 2.  Diabetic: Uncontrolled as the patient presented in DKA. Encouraged good control as its slows the progression of atherosclerotic disease 3.  Hyperlipidemia: On statin. Encouraged good control as its slows the progression of atherosclerotic disease.  Discussed with Dr. Mayme Genta, PA-C  12/15/2018 2:56 PM  This note was created with Dragon medical transcription system.  Any error is purely unintentional

## 2018-12-15 NOTE — ED Notes (Signed)
ED TO INPATIENT HANDOFF REPORT  ED Nurse Name and Phone #:  Reuel Boom [phone]  S Name/Age/Gender Sylvia [name] 63 y.o. female Room/Bed: ED18A/ED18A  Code Status   Code Status: Prior  Home/SNF/Other Home Patient oriented to: self, place, time and situation Is this baseline? Yes   Triage Complete: Triage complete  Chief Complaint abd pain  Triage Note Pt complains of abd pain started at 2am. Pt seems drowsy.    Allergies Allergies  Allergen Reactions  . Penicillins Hives    Has patient had a PCN reaction causing immediate rash, facial/tongue/throat swelling, SOB or lightheadedness with hypotension: Yes Has patient had a PCN reaction causing severe rash involving mucus membranes or skin necrosis: No Has patient had a PCN reaction that required hospitalization: No Has patient had a PCN reaction occurring within the last 10 years: No If all of the above answers are "NO", then may proceed with Cephalosporin use.  . Tramadol Itching    Level of Care/Admitting Diagnosis ED Disposition    ED Disposition Condition Comment   Admit  Hospital Area: [facility] [[id number]]  Level of Care: Med-Surg [16]  Covid Evaluation: Confirmed COVID Negative  Diagnosis: DKA (diabetic ketoacidoses) [facility]) [[id number]]  Admitting Physician: Hannah Beat [[id number]]  Attending Physician: Hannah Beat [[id number]]  Estimated length of stay: past midnight tomorrow  Certification:: I certify this patient will need inpatient services for at least 2 midnights  PT Class (Do Not Modify): Inpatient [101]  PT Acc Code (Do Not Modify): Private [1]       B Medical/Surgery History Past Medical History:  Diagnosis Date  . Anxiety    h/o  . COPD (chronic obstructive pulmonary disease) (HCC)   . Diabetes mellitus without complication (HCC)   . GERD (gastroesophageal reflux disease)   . Hypertension    bp under control-off meds since 2019   Past Surgical History:  Procedure  Laterality Date  . ABDOMINAL HYSTERECTOMY    . CHOLECYSTECTOMY    . ESOPHAGOGASTRODUODENOSCOPY (EGD) WITH PROPOFOL N/A 06/15/2018   Procedure: ESOPHAGOGASTRODUODENOSCOPY (EGD) WITH PROPOFOL;  Surgeon: [place], Boykin Nearing, MD;  Location: [facility] ENDOSCOPY;  Service: Gastroenterology;  Laterality: N/A;  . LOWER EXTREMITY ANGIOGRAPHY Right 08/21/2018   Procedure: LOWER EXTREMITY ANGIOGRAPHY;  Surgeon: Annice Needy, MD;  Location: [facility] INVASIVE CV LAB;  Service: Cardiovascular;  Laterality: Right;  . LOWER EXTREMITY ANGIOGRAPHY Left 08/28/2018   Procedure: LOWER EXTREMITY ANGIOGRAPHY;  Surgeon: Annice Needy, MD;  Location: [facility] INVASIVE CV LAB;  Service: Cardiovascular;  Laterality: Left;  . LOWER EXTREMITY ANGIOGRAPHY Right 08/28/2018   Procedure: Lower Extremity Angiography;  Surgeon: Annice Needy, MD;  Location: [facility] INVASIVE CV LAB;  Service: Cardiovascular;  Laterality: Right;     A IV Location/Drains/Wounds Patient Lines/Drains/Airways Status   Active Line/Drains/Airways    Name:   Placement date:   Placement time:   Site:   Days:   Peripheral IV 06/15/18 Right;Lateral Forearm   06/15/18    0624    Forearm   183   Peripheral IV 08/28/18 Anterior;Proximal;Right Forearm   08/28/18    0726    Forearm   109   Peripheral IV 12/14/18 Right Hand   12/14/18    1445    Hand   1   Peripheral IV 12/14/18 Right Antecubital   12/14/18    1305    Antecubital   1   Sheath 08/21/18 Left Arterial;Femoral   08/21/18    0834    Arterial;Femoral  116   Sheath 08/28/18 Right Arterial;Femoral   08/28/18    0830    Arterial;Femoral   109   Sheath 08/28/18 Right Arterial;Femoral   08/28/18    0924    Arterial;Femoral   109          Intake/Output Last 24 hours  Intake/Output Summary (Last 24 hours) at 12/15/2018 0135 Last data filed at 12/14/2018 1649 Gross per 24 hour  Intake 100 ml  Output -  Net 100 ml    Labs/Imaging Results for orders placed or performed during the hospital encounter of 12/14/18 (from  the past 48 hour(s))  Basic metabolic panel     Status: Abnormal   Collection Time: 12/14/18 12:40 PM  Result Value Ref Range   Sodium 141 135 - 145 mmol/L   Potassium 3.8 3.5 - 5.1 mmol/L   Chloride 104 98 - 111 mmol/L   CO2 20 (L) 22 - 32 mmol/L   Glucose, Bld 291 (H) 70 - 99 mg/dL   BUN 14 8 - 23 mg/dL   Creatinine, Ser 1.611.04 (H) 0.44 - 1.00 mg/dL   Calcium 09.610.8 (H) 8.9 - 10.3 mg/dL   GFR calc non Af Amer 57 (L) >60 mL/min   GFR calc Af Amer >60 >60 mL/min   Anion gap 17 (H) 5 - 15    Comment: Performed at [facility], [address] [place], [place] [address]  Hepatic function panel     Status: Abnormal   Collection Time: 12/14/18 12:40 PM  Result Value Ref Range   Total Protein 8.4 (H) 6.5 - 8.1 g/dL   Albumin 4.3 3.5 - 5.0 g/dL   AST 17 15 - 41 U/L   ALT 15 0 - 44 U/L   Alkaline Phosphatase 86 38 - 126 U/L   Total Bilirubin 1.2 0.3 - 1.2 mg/dL   Bilirubin, Direct 0.1 0.0 - 0.2 mg/dL   Indirect Bilirubin 1.1 (H) 0.3 - 0.9 mg/dL    Comment: Performed at [facility], [address] [place], [place] [address]  Lipase, blood     Status: None   Collection Time: 12/14/18 12:40 PM  Result Value Ref Range   Lipase 24 11 - 51 U/L    Comment: Performed at [facility], [address] [place], [place] [address]  CBC with Differential     Status: Abnormal   Collection Time: 12/14/18 12:40 PM  Result Value Ref Range   WBC 15.4 (H) 4.0 - 10.5 K/uL   RBC 3.94 3.87 - 5.11 MIL/uL   Hemoglobin 11.1 (L) 12.0 - 15.0 g/dL   HCT 95.632.1 (L) 21.336.0 - 08.646.0 %   MCV 81.5 80.0 - 100.0 fL   MCH 28.2 26.0 - 34.0 pg   MCHC 34.6 30.0 - 36.0 g/dL   RDW 57.813.9 46.911.5 - 62.915.5 %   Platelets 312 150 - 400 K/uL   nRBC 0.0 0.0 - 0.2 %   Neutrophils Relative % 88 %   Neutro Abs 13.6 (H) 1.7 - 7.7 K/uL   Lymphocytes Relative 8 %   Lymphs Abs 1.2 0.7 - 4.0 K/uL   Monocytes Relative 3 %   Monocytes Absolute 0.5 0.1 - 1.0 K/uL   Eosinophils Relative 0 %   Eosinophils Absolute  0.0 0.0 - 0.5 K/uL   Basophils Relative 0 %   Basophils Absolute 0.1 0.0 - 0.1 K/uL   Immature Granulocytes 1 %   Abs Immature Granulocytes 0.09 (H) 0.00 - 0.07 K/uL  Comment: Performed at [facility], [address] [place], [place] [address]  Beta-hydroxybutyric acid     Status: Abnormal   Collection Time: 12/14/18 12:40 PM  Result Value Ref Range   Beta-Hydroxybutyric Acid 0.49 (H) 0.05 - 0.27 mmol/L    Comment: Performed at [facility], [address] [place], [place] [address]  Lactic acid, plasma     Status: None   Collection Time: 12/14/18 12:41 PM  Result Value Ref Range   Lactic Acid, Venous 1.7 0.5 - 1.9 mmol/L    Comment: Performed at [facility], [address] [place], [place] [address]  Blood gas, venous     Status: Abnormal   Collection Time: 12/14/18  2:41 PM  Result Value Ref Range   pH, Ven 7.32 7.250 - 7.430   pCO2, Ven 36 (L) 44.0 - 60.0 mmHg   pO2, Ven 33.0 32.0 - 45.0 mmHg   Patient temperature 37.0    Collection site VENOUS    Sample type VENOUS     Comment: Performed at [facility], [address] [place], [place] [address]  Glucose, capillary     Status: Abnormal   Collection Time: 12/14/18  2:58 PM  Result Value Ref Range   Glucose-Capillary 247 (H) 70 - 99 mg/dL  SARS Coronavirus 2 [facility] order, Performed in [facility] hospital lab) Nasopharyngeal Nasopharyngeal Swab     Status: None   Collection Time: 12/14/18  3:11 PM   Specimen: Nasopharyngeal Swab  Result Value Ref Range   SARS Coronavirus 2 NEGATIVE NEGATIVE    Comment: (NOTE) If result is NEGATIVE SARS-CoV-2 target nucleic acids are NOT DETECTED. The SARS-CoV-2 RNA is generally detectable in upper and lower  respiratory specimens during the acute phase of infection. The lowest  concentration of SARS-CoV-2 viral copies this assay can detect is 250  copies / mL. A negative result does not preclude SARS-CoV-2 infection  and should not be used  as the sole basis for treatment or other  patient management decisions.  A negative result may occur with  improper specimen collection / handling, submission of specimen other  than nasopharyngeal swab, presence of viral mutation(s) within the  areas targeted by this assay, and inadequate number of viral copies  (<250 copies / mL). A negative result must be combined with clinical  observations, patient history, and epidemiological information. If result is POSITIVE SARS-CoV-2 target nucleic acids are DETECTED. The SARS-CoV-2 RNA is generally detectable in upper and lower  respiratory specimens dur ing the acute phase of infection.  Positive  results are indicative of active infection with SARS-CoV-2.  Clinical  correlation with patient history and other diagnostic information is  necessary to determine patient infection status.  Positive results do  not rule out bacterial infection or co-infection with other viruses. If result is PRESUMPTIVE POSTIVE SARS-CoV-2 nucleic acids MAY BE PRESENT.   A presumptive positive result was obtained on the submitted specimen  and confirmed on repeat testing.  While 2019 novel coronavirus  (SARS-CoV-2) nucleic acids may be present in the submitted sample  additional confirmatory testing may be necessary for epidemiological  and / or clinical management purposes  to differentiate between  SARS-CoV-2 and other Sarbecovirus currently known to infect humans.  If clinically indicated additional testing with an alternate test  methodology [phone]) is advised. The SARS-CoV-2 RNA is generally  detectable in upper and lower respiratory sp ecimens during the acute  phase of infection. The expected result is Negative. Fact  Sheet for Patients:  BoilerBrush.com.cy Fact Sheet for Healthcare Providers: https://pope.com/ This test is not yet approved or cleared by the [place] FDA and has been authorized for  detection and/or diagnosis of SARS-CoV-2 by FDA under an Emergency Use Authorization (EUA).  This EUA will remain in effect (meaning this test can be used) for the duration of the COVID-19 declaration under Section 564(b)(1) of the Act, 21 U.S.C. section 360bbb-3(b)(1), unless the authorization is terminated or revoked sooner. Performed at [facility], [address] [place], [place] [address]   Glucose, capillary     Status: Abnormal   Collection Time: 12/14/18  4:08 PM  Result Value Ref Range   Glucose-Capillary 236 (H) 70 - 99 mg/dL  Glucose, capillary     Status: Abnormal   Collection Time: 12/14/18  5:07 PM  Result Value Ref Range   Glucose-Capillary 214 (H) 70 - 99 mg/dL  Glucose, capillary     Status: Abnormal   Collection Time: 12/14/18  6:45 PM  Result Value Ref Range   Glucose-Capillary 189 (H) 70 - 99 mg/dL  Glucose, capillary     Status: Abnormal   Collection Time: 12/14/18  8:20 PM  Result Value Ref Range   Glucose-Capillary 140 (H) 70 - 99 mg/dL  Brain natriuretic peptide     Status: None   Collection Time: 12/14/18  8:50 PM  Result Value Ref Range   B Natriuretic Peptide 75.0 0.0 - 100.0 pg/mL    Comment: Performed at [facility], [address] [place], [place] [address]  Glucose, capillary     Status: Abnormal   Collection Time: 12/14/18  9:21 PM  Result Value Ref Range   Glucose-Capillary 114 (H) 70 - 99 mg/dL  Glucose, capillary     Status: Abnormal   Collection Time: 12/14/18 10:32 PM  Result Value Ref Range   Glucose-Capillary 124 (H) 70 - 99 mg/dL  Basic metabolic panel     Status: Abnormal   Collection Time: 12/14/18 10:37 PM  Result Value Ref Range   Sodium 137 135 - 145 mmol/L   Potassium 3.2 (L) 3.5 - 5.1 mmol/L   Chloride 106 98 - 111 mmol/L   CO2 22 22 - 32 mmol/L   Glucose, Bld 418 (H) 70 - 99 mg/dL   BUN 9 8 - 23 mg/dL   Creatinine, Ser 2.53 0.44 - 1.00 mg/dL   Calcium 8.8 (L) 8.9 - 10.3 mg/dL   GFR calc non Af  Amer >60 >60 mL/min   GFR calc Af Amer >60 >60 mL/min   Anion gap 9 5 - 15    Comment: Performed at [facility], [address] [place], [place] [address]  Glucose, capillary     Status: Abnormal   Collection Time: 12/14/18 11:37 PM  Result Value Ref Range   Glucose-Capillary 113 (H) 70 - 99 mg/dL  Basic metabolic panel     Status: Abnormal   Collection Time: 12/15/18 12:28 AM  Result Value Ref Range   Sodium 141 135 - 145 mmol/L   Potassium 3.3 (L) 3.5 - 5.1 mmol/L   Chloride 108 98 - 111 mmol/L   CO2 24 22 - 32 mmol/L   Glucose, Bld 113 (H) 70 - 99 mg/dL   BUN 11 8 - 23 mg/dL   Creatinine, Ser 3.47 0.44 - 1.00 mg/dL   Calcium 9.4 8.9 - 42.5 mg/dL   GFR calc non Af Amer >60 >60 mL/min   GFR calc Af Amer >60 >  60 mL/min   Anion gap 9 5 - 15    Comment: Performed at [facility], [address] [place], [place] [address]  Glucose, capillary     Status: Abnormal   Collection Time: 12/15/18 12:38 AM  Result Value Ref Range   Glucose-Capillary 111 (H) 70 - 99 mg/dL   Ct Abdomen Pelvis W Contrast  Result Date: 12/14/2018 CLINICAL DATA:  Abdo pain with nausea and vomiting EXAM: CT ABDOMEN AND PELVIS WITH CONTRAST TECHNIQUE: Multidetector CT imaging of the abdomen and pelvis was performed using the standard protocol following bolus administration of intravenous contrast. CONTRAST:  100mL OMNIPAQUE IOHEXOL 300 MG/ML  SOLN COMPARISON:  June 13, 2018 FINDINGS: Lower chest: There is bibasilar atelectasis. There is no lung base edema or consolidation. Hepatobiliary: There is hepatic steatosis. No focal liver lesions are evident. The gallbladder is absent. There is no appreciable biliary duct dilatation. Pancreas: There is no pancreatic mass or inflammatory focus. Spleen: No splenic lesions are evident. Adrenals/Urinary Tract: Adrenals bilaterally appear unremarkable. There is a 7 x 5 mm cyst in the periphery of the mid right kidney. There is no evident hydronephrosis on  either side. There is no evident renal or ureteral calculus on either side. Urinary bladder is midline with wall thickness within normal limits. Stomach/Bowel: Most small bowel loops are fluid-filled. There is no appreciable bowel wall or mesenteric thickening. No evident bowel obstruction. There are cecal diverticula without inflammation. The terminal ileum appears unremarkable. There is no evident free air or portal venous air. Vascular/Lymphatic: There is extensive aortic and iliac artery atherosclerosis. There is extensive plaque in the mid aorta which appears to cause subtotal occlusion at the level of the lower poles of the kidneys. Distal aorta is mildly ectatic with a maximum transverse diameter of 2.3 x 2.3 cm. No frank aneurysm. No adenopathy is evident in the abdomen or pelvis. Reproductive: Uterus is absent.  No evident pelvic mass. Other: Appendix appears unremarkable. No evident abscess or ascites in the abdomen or pelvis. Musculoskeletal: There is degenerative change in each hip joint and lower lumbar spine. There are no blastic or lytic bone lesions. There is no intramuscular or abdominal wall lesion. IMPRESSION: 1. Extensive aortoiliac atherosclerosis. Extensive plaque in particular is noted in the aorta with subtotal occlusion in the mid aorta at the level of the lower poles of the kidneys. 2. Most small bowel loops are fluid-filled. This is a finding that may be indicative of a degree of ileus or enteritis. No bowel obstruction evident. There are diverticula arising from the cecum without diverticulitis. No diverticulitis seen elsewhere. 3.  No abscess in the abdomen or pelvis.  Appendix appears normal. 4. No renal or ureteral calculi. No hydronephrosis. Urinary bladder wall thickness within normal limits. 5.  Hepatic steatosis.  Gallbladder absent. 6.  Uterus absent. Electronically Signed   By: Bretta BangWilliam  Woodruff III M.D.   On: 12/14/2018 14:17    Pending Labs Unresulted Labs (From admission,  onward)    Start     Ordered   12/15/18 0121  Hemoglobin A1c  Once,   STAT    Comments: To assess prior glycemic control    12/15/18 0121   12/14/18 1757  Urinalysis, Complete w Microscopic  Once,   STAT     12/14/18 1756   12/14/18 1238  Lactic acid, plasma  Now then every 2 hours,   STAT     12/14/18 1237   Signed and Held  Basic metabolic panel  STAT Now  then every 4 hours ,   STAT     Signed and Held   Signed and Held  Hemoglobin A1c  Once,   R    Comments: To assess prior glycemic control.    Signed and Held   Signed and Held  Creatinine, serum  (enoxaparin (LOVENOX)    CrCl >/= 30 ml/min)  Weekly,   R    Comments: while on enoxaparin therapy    Signed and Held          Vitals/Pain Today's Vitals   12/14/18 2230 12/14/18 2300 12/14/18 2330 12/15/18 0030  BP: (!) 149/86 (!) 153/61 (!) 156/78 (!) 166/60  Pulse: 79 86 69 75  Resp: (!) 25 19 19 20   Temp:      TempSrc:      SpO2: 100% 99% 99% 100%  Weight:      Height:      PainSc:        Isolation Precautions No active isolations  Medications Medications  insulin regular, human (MYXREDLIN) 100 units/ 100 mL infusion (5.2 Units/hr Intravenous Rate/Dose Change 12/14/18 1606)  dextrose 5 %-0.45 % sodium chloride infusion ( Intravenous New Bag/Given 12/15/18 0035)  potassium chloride 10 mEq in 100 mL IVPB (0 mEq Intravenous Stopped 12/14/18 1649)  sodium chloride 0.9 % bolus 1,000 mL (0 mLs Intravenous Stopped 12/15/18 0035)    And  0.9 %  sodium chloride infusion (has no administration in time range)  insulin aspart (novoLOG) injection 0-20 Units (has no administration in time range)  lactated ringers bolus 1,000 mL (0 mLs Intravenous Stopped 12/15/18 0035)  morphine 4 MG/ML injection 4 mg (4 mg Intravenous Given 12/14/18 1248)  ondansetron (ZOFRAN) injection 4 mg (4 mg Intravenous Given 12/14/18 1245)  iohexol (OMNIPAQUE) 300 MG/ML solution 100 mL (100 mLs Intravenous Contrast Given 12/14/18 1356)     Mobility walks Low fall risk   Focused Assessments NVD   R Recommendations: See Admitting Provider Note  Report given to:   Additional Notes:

## 2018-12-15 NOTE — ED Notes (Signed)
Floor nurse informed that patient did not receive a second lactic due to first lactic being WDL. Floor nurse further informed that patient's UA has not been completed due to patient being hooked to purwick. Floor nurse to discontinue glucostabilizer when appropriate.

## 2018-12-15 NOTE — Progress Notes (Signed)
Ch visited with pt to complete request for prayer. Pt is a 63 y.o. that is having problems related to her ulcer. Pt shared that she ate cabbage that was spicy and felt weak from vomiting/diarrea for a long period of time. 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 Pt shares that she does not smoke but lives with 4 smokers and has cravings sometimes. Pt is hopeful that she will get to see a specialist related to her ulcer(s) when she is d/c. Ch asked guided questions regarding the pt's quality of life. Pt wants to gain her health back so she can spend more time with her great-grandchildren. Ch provided words of encouragement and allowed space for the pt to share her testimony and how her faith has sustained her through grief of losing her daughter and mom less than 2 months back in 2017/8. Ch offered an "Our Daily Bread" booklet to the pt and prayed at her bedside for her healing as the pt prepared for her meds.    12/15/18 1200  Clinical Encounter Type  Visited With Patient  Visit Type Psychological support;Spiritual support;Social support  Referral From Physician  Consult/Referral To Chaplain  Spiritual Encounters  Spiritual Needs Literature;Prayer;Emotional;Grief support  Stress Factors  Patient Stress Factors Health changes;Loss of control;Major life changes  Family Stress Factors None identified

## 2018-12-15 NOTE — Consult Note (Signed)
ANTICOAGULATION CONSULT NOTE - Initial Consult  Pharmacy Consult for Heparin Indication: VTE treatment  Allergies  Allergen Reactions  . Penicillins Hives    Has patient had a PCN reaction causing immediate rash, facial/tongue/throat swelling, SOB or lightheadedness with hypotension: Yes Has patient had a PCN reaction causing severe rash involving mucus membranes or skin necrosis: No Has patient had a PCN reaction that required hospitalization: No Has patient had a PCN reaction occurring within the last 10 years: No If all of the above answers are "NO", then may proceed with Cephalosporin use.  . Tramadol Itching    Patient Measurements: Height: 5\' 7"  (170.2 cm) Weight: 142 lb 14.4 oz (64.8 kg) IBW/kg (Calculated) : 61.6 Heparin Dosing Weight: 64.8 kg  Vital Signs: Temp: 98.7 F (37.1 C) (08/28 0856) Temp Source: Oral (08/28 0856) BP: 135/75 (08/28 0856) Pulse Rate: 70 (08/28 0856)  Labs: Recent Labs    12/14/18 1240  12/15/18 0304 12/15/18 0713 12/15/18 1031  HGB 11.1*  --   --   --   --   HCT 32.1*  --   --   --   --   PLT 312  --   --   --   --   CREATININE 1.04*   < > 0.90 0.88 0.91   < > = values in this interval not displayed.    Estimated Creatinine Clearance: 61.5 mL/min (by C-G formula based on SCr of 0.91 mg/dL).   Medical History: Past Medical History:  Diagnosis Date  . Anxiety    h/o  . COPD (chronic obstructive pulmonary disease) ([place])   . Diabetes mellitus without complication ([place])   . GERD (gastroesophageal reflux disease)   . Hypertension    bp under control-off meds since 2019    Medications:  Medications Prior to Admission  Medication Sig Dispense Refill Last Dose  . albuterol (PROVENTIL HFA;VENTOLIN HFA) 108 (90 Base) MCG/ACT inhaler Inhale 2 puffs into the lungs every 6 (six) hours as needed for wheezing or shortness of breath. 1 Inhaler 2 prn at prn  . alum & mag hydroxide-simeth (MAALOX/MYLANTA) 200-200-20 MG/5ML suspension Take 30  mLs by mouth every 4 (four) hours as needed for indigestion or heartburn. 355 mL 0 prn at prn  . atorvastatin (LIPITOR) 10 MG tablet Take 1 tablet (10 mg total) by mouth daily. 90 tablet 3 12/13/2018 at Unknown time  . Biotin 1000 MCG tablet Take 1,000 tablets by mouth daily.    12/13/2018 at Unknown time  . cetirizine (ZYRTEC) 10 MG tablet Take 10 mg by mouth daily.   12/13/2018 at Unknown time  . cholecalciferol (VITAMIN D3) 25 MCG (1000 UT) tablet Take 1,000 Units by mouth daily.   12/13/2018 at Unknown time  . iron polysaccharides (NIFEREX) 150 MG capsule Take 1 capsule (150 mg total) by mouth daily. 30 capsule 3 12/13/2018 at Unknown time  . Multiple Vitamins-Minerals (WOMENS MULTIVITAMIN PO) Take 1 tablet by mouth daily.   12/13/2018 at Unknown time  . pantoprazole (PROTONIX) 40 MG tablet Take 1 tablet (40 mg total) by mouth daily. 30 tablet 0 12/13/2018 at Unknown time  . sitaGLIPtin (JANUVIA) 100 MG tablet Take 100 mg by mouth daily.   12/13/2018 at Unknown time  . vitamin C (ASCORBIC ACID) 500 MG tablet Take 500 mg by mouth daily.   12/13/2018 at Unknown time  . warfarin (COUMADIN) 5 MG tablet Take 1 tablet (5 mg total) by mouth daily at 6 PM. 30 tablet 5 12/13/2018 at Unknown time  Scheduled:  . atorvastatin  10 mg Oral Daily  . enoxaparin (LOVENOX) injection  40 mg Subcutaneous Q24H  . insulin aspart  0-20 Units Subcutaneous TID WC  . linagliptin  5 mg Oral Daily  . sodium chloride flush  3 mL Intravenous Q12H   Infusions:  . sodium chloride 75 mL/hr at 12/15/18 1050   PRN: morphine injection Anti-infectives (From admission, onward)   None      Assessment: History of DVT.  The patient was on Coumadin, off Coumadin for 2 weeks due to recent GI bleeding. Vascular suggesting to start heparin and holding coumadin for now. Will order baseline labs. Pt took warfarin 5 mg daily PTA.   Goal of Therapy:  Heparin level 0.3-0.7 units/ml Monitor platelets by anticoagulation protocol: Yes    Plan:  Give 3800 units bolus x 1 Start heparin infusion at 950 units/hr Check anti-Xa level in 6 hours and daily while on heparin Continue to monitor H&H and platelets  Ronnald Ramp, PharmD, BCPS 12/15/2018,11:46 AM

## 2018-12-15 NOTE — Progress Notes (Signed)
Arrived from ED via stretcher.  Purewick in place and discontinued on arrival.  Pt able to ambulate to bathroom with steady gait.  Urine specimen collected and sent to lab.  Saline lock X 2 patent.  No IV fluids infusing on arrival to floor.

## 2018-12-16 LAB — CBC
HCT: 27.2 % — ABNORMAL LOW (ref 36.0–46.0)
Hemoglobin: 9.1 g/dL — ABNORMAL LOW (ref 12.0–15.0)
MCH: 28 pg (ref 26.0–34.0)
MCHC: 33.5 g/dL (ref 30.0–36.0)
MCV: 83.7 fL (ref 80.0–100.0)
Platelets: 231 10*3/uL (ref 150–400)
RBC: 3.25 MIL/uL — ABNORMAL LOW (ref 3.87–5.11)
RDW: 14 % (ref 11.5–15.5)
WBC: 7.9 10*3/uL (ref 4.0–10.5)
nRBC: 0 % (ref 0.0–0.2)

## 2018-12-16 LAB — BASIC METABOLIC PANEL
Anion gap: 6 (ref 5–15)
BUN: 9 mg/dL (ref 8–23)
CO2: 23 mmol/L (ref 22–32)
Calcium: 8.7 mg/dL — ABNORMAL LOW (ref 8.9–10.3)
Chloride: 112 mmol/L — ABNORMAL HIGH (ref 98–111)
Creatinine, Ser: 0.84 mg/dL (ref 0.44–1.00)
GFR calc Af Amer: >60 mL/min (ref >60)
GFR calc non Af Amer: >60 mL/min (ref >60)
Glucose, Bld: 127 mg/dL — ABNORMAL HIGH (ref 70–99)
Potassium: 3.4 mmol/L — ABNORMAL LOW (ref 3.5–5.1)
Sodium: 141 mmol/L (ref 135–145)

## 2018-12-16 LAB — GLUCOSE, CAPILLARY
Glucose-Capillary: 135 mg/dL — ABNORMAL HIGH (ref 70–99)
Glucose-Capillary: 117 mg/dL — ABNORMAL HIGH (ref 70–99)
Glucose-Capillary: 180 mg/dL — ABNORMAL HIGH (ref 70–99)
Glucose-Capillary: 140 mg/dL — ABNORMAL HIGH (ref 70–99)

## 2018-12-16 LAB — HEPARIN LEVEL (UNFRACTIONATED)
Heparin Unfractionated: 0.37 IU/mL (ref 0.30–0.70)
Heparin Unfractionated: 0.33 IU/mL (ref 0.30–0.70)

## 2018-12-16 MED ORDER — ONDANSETRON HCL 4 MG/2ML IJ SOLN
4.0000 mg | Freq: Four times a day (QID) | INTRAMUSCULAR | Status: DC | PRN
  Administered 2018-12-16 – 2018-12-17 (×3): 4 mg via INTRAVENOUS
  Filled 2018-12-16 (×3): qty 2

## 2018-12-16 MED ORDER — DOCUSATE SODIUM 100 MG PO CAPS
100.0000 mg | ORAL_CAPSULE | Freq: Two times a day (BID) | ORAL | Status: DC
  Administered 2018-12-16 – 2018-12-20 (×8): 100 mg via ORAL
  Filled 2018-12-16 (×8): qty 1

## 2018-12-16 MED ORDER — METOCLOPRAMIDE HCL 5 MG PO TABS
5.0000 mg | ORAL_TABLET | Freq: Three times a day (TID) | ORAL | Status: DC
  Administered 2018-12-16 – 2019-01-06 (×48): 5 mg via ORAL
  Filled 2018-12-16 (×57): qty 1

## 2018-12-16 NOTE — Progress Notes (Signed)
[employer] - [place] at [facility]   PATIENT NAME: Sylvia [name]    MR#:  [id number]  DATE OF BIRTH:  [birth year]  SUBJECTIVE: Having breakfast.  CHIEF COMPLAINT:   Chief Complaint  Patient presents with  . Abdominal Pain   On heparin drip, possible angiogram of legs on Monday.  REVIEW OF SYSTEMS:  CONSTITUTIONAL: No fever, fatigue or weakness.  EYES: No blurred or double vision.  EARS, NOSE, AND THROAT: No tinnitus or ear pain.  RESPIRATORY: No cough, shortness of breath, wheezing or hemoptysis.  CARDIOVASCULAR: No chest pain, orthopnea, edema.  GASTROINTESTINAL: No nausea, vomiting, diarrhea or abdominal pain.  GENITOURINARY: No dysuria, hematuria.  ENDOCRINE: No polyuria, nocturia,  HEMATOLOGY: No anemia, easy bruising or bleeding SKIN: No rash or lesion. MUSCULOSKELETAL: No joint pain or arthritis.   NEUROLOGIC: No tingling, numbness, weakness.  PSYCHIATRY: No anxiety or depression.     DRUG ALLERGIES:   Allergies  Allergen Reactions  . Penicillins Hives    Has patient had a PCN reaction causing immediate rash, facial/tongue/throat swelling, SOB or lightheadedness with hypotension: Yes Has patient had a PCN reaction causing severe rash involving mucus membranes or skin necrosis: No Has patient had a PCN reaction that required hospitalization: No Has patient had a PCN reaction occurring within the last 10 years: No If all of the above answers are "NO", then may proceed with Cephalosporin use.  . Tramadol Itching    VITALS:  Blood pressure (!) 141/65, pulse 62, temperature 97.9 F (36.6 C), temperature source Oral, resp. rate 19, height 5\' 7"  (1.702 m), weight 66.7 kg, SpO2 100 %.  PHYSICAL EXAMINATION:  GENERAL:  63 y.o.-year-old patient lying in the bed with no acute distress.  EYES: Pupils equal, round, reactive to light and accommodation. No scleral icterus. Extraocular muscles intact.  HEENT: Head atraumatic, normocephalic. Oropharynx and  nasopharynx clear.  NECK:  Supple, no jugular venous distention. No thyroid enlargement, no tenderness.  LUNGS: Normal breath sounds bilaterally, no wheezing, rales,rhonchi or crepitation. No use of accessory muscles of respiration.  CARDIOVASCULAR: S1, S2 normal. No murmurs, rubs, or gallops.  ABDOMEN: Soft, nontender, nondistended. Bowel sounds present. No organomegaly or mass.  EXTREMITIES: No pedal edema, cyanosis, or clubbing.  NEUROLOGIC: Cranial nerves II through XII are intact. Muscle strength 4/5 in all extremities. Sensation intact. Gait not checked.  PSYCHIATRIC: The patient is alert and oriented x 3.  SKIN: No obvious rash, lesion, or ulcer.   Physical Exam LABORATORY PANEL:   CBC Recent Labs  Lab 12/16/18 0511  WBC 7.9  HGB 9.1*  HCT 27.2*  PLT 231   ------------------------------------------------------------------------------------------------------------------  Chemistries  Recent Labs  Lab 12/14/18 1240  12/16/18 0511  NA 141   < > 141  K 3.8   < > 3.4*  CL 104   < > 112*  CO2 20*   < > 23  GLUCOSE 291*   < > 127*  BUN 14   < > 9  CREATININE 1.04*   < > 0.84  CALCIUM 10.8*   < > 8.7*  AST 17  --   --   ALT 15  --   --   ALKPHOS 86  --   --   BILITOT 1.2  --   --    < > = values in this interval not displayed.   ------------------------------------------------------------------------------------------------------------------  Cardiac Enzymes No results for input(s): TROPONINI in the last 168 hours. ------------------------------------------------------------------------------------------------------------------  RADIOLOGY:  Ct Abdomen Pelvis W Contrast  Result  Date: 12/14/2018 CLINICAL DATA:  Abdo pain with nausea and vomiting EXAM: CT ABDOMEN AND PELVIS WITH CONTRAST TECHNIQUE: Multidetector CT imaging of the abdomen and pelvis was performed using the standard protocol following bolus administration of intravenous contrast. CONTRAST:  156mL  OMNIPAQUE IOHEXOL 300 MG/ML  SOLN COMPARISON:  June 13, 2018 FINDINGS: Lower chest: There is bibasilar atelectasis. There is no lung base edema or consolidation. Hepatobiliary: There is hepatic steatosis. No focal liver lesions are evident. The gallbladder is absent. There is no appreciable biliary duct dilatation. Pancreas: There is no pancreatic mass or inflammatory focus. Spleen: No splenic lesions are evident. Adrenals/Urinary Tract: Adrenals bilaterally appear unremarkable. There is a 7 x 5 mm cyst in the periphery of the mid right kidney. There is no evident hydronephrosis on either side. There is no evident renal or ureteral calculus on either side. Urinary bladder is midline with wall thickness within normal limits. Stomach/Bowel: Most small bowel loops are fluid-filled. There is no appreciable bowel wall or mesenteric thickening. No evident bowel obstruction. There are cecal diverticula without inflammation. The terminal ileum appears unremarkable. There is no evident free air or portal venous air. Vascular/Lymphatic: There is extensive aortic and iliac artery atherosclerosis. There is extensive plaque in the mid aorta which appears to cause subtotal occlusion at the level of the lower poles of the kidneys. Distal aorta is mildly ectatic with a maximum transverse diameter of 2.3 x 2.3 cm. No frank aneurysm. No adenopathy is evident in the abdomen or pelvis. Reproductive: Uterus is absent.  No evident pelvic mass. Other: Appendix appears unremarkable. No evident abscess or ascites in the abdomen or pelvis. Musculoskeletal: There is degenerative change in each hip joint and lower lumbar spine. There are no blastic or lytic bone lesions. There is no intramuscular or abdominal wall lesion. IMPRESSION: 1. Extensive aortoiliac atherosclerosis. Extensive plaque in particular is noted in the aorta with subtotal occlusion in the mid aorta at the level of the lower poles of the kidneys. 2. Most small bowel loops  are fluid-filled. This is a finding that may be indicative of a degree of ileus or enteritis. No bowel obstruction evident. There are diverticula arising from the cecum without diverticulitis. No diverticulitis seen elsewhere. 3.  No abscess in the abdomen or pelvis.  Appendix appears normal. 4. No renal or ureteral calculi. No hydronephrosis. Urinary bladder wall thickness within normal limits. 5.  Hepatic steatosis.  Gallbladder absent. 6.  Uterus absent. Electronically Signed   By: Lowella Grip III M.D.   On: 12/14/2018 14:17   US Venous Img Lower Bilateral  Result Date: 12/15/2018 CLINICAL DATA:  Bilateral lower extremity edema. History of previous DVT. Patient recently came off of anticoagulation. Evaluate for acute or chronic DVT. EXAM: BILATERAL LOWER EXTREMITY VENOUS DOPPLER ULTRASOUND TECHNIQUE: Gray-scale sonography with graded compression, as well as color Doppler and duplex ultrasound were performed to evaluate the lower extremity deep venous systems from the level of the common femoral vein and including the common femoral, femoral, profunda femoral, popliteal and calf veins including the posterior tibial, peroneal and gastrocnemius veins when visible. The superficial great saphenous vein was also interrogated. Spectral Doppler was utilized to evaluate flow at rest and with distal augmentation maneuvers in the common femoral, femoral and popliteal veins. COMPARISON:  CT abdomen and pelvis-12/14/2018; 06/13/2018 FINDINGS: RIGHT LOWER EXTREMITY Common Femoral Vein: No evidence of thrombus. Normal compressibility, respiratory phasicity and response to augmentation. Saphenofemoral Junction: No evidence of thrombus. Normal compressibility and flow on color Doppler imaging.  Profunda Femoral Vein: No evidence of thrombus. Normal compressibility and flow on color Doppler imaging. Femoral Vein: No evidence of thrombus. Normal compressibility, respiratory phasicity and response to augmentation. Popliteal  Vein: No evidence of thrombus. Normal compressibility, respiratory phasicity and response to augmentation. Calf Veins: No evidence of thrombus. Normal compressibility and flow on color Doppler imaging. Superficial Great Saphenous Vein: No evidence of thrombus. Normal compressibility. Venous Reflux:  None. Other Findings: Previously stented right superficial femoral artery appears occluded throughout its imaged course (representative images 31 through 34) with atretic reconstitution of the right popliteal artery (image 35). LEFT LOWER EXTREMITY Common Femoral Vein: No evidence of thrombus. Normal compressibility, respiratory phasicity and response to augmentation. Saphenofemoral Junction: No evidence of thrombus. Normal compressibility and flow on color Doppler imaging. Profunda Femoral Vein: No evidence of thrombus. Normal compressibility and flow on color Doppler imaging. Femoral Vein: No evidence of thrombus. Normal compressibility, respiratory phasicity and response to augmentation. Popliteal Vein: No evidence of thrombus. Normal compressibility, respiratory phasicity and response to augmentation. Calf Veins: No evidence of thrombus. Normal compressibility and flow on color Doppler imaging. Superficial Great Saphenous Vein: No evidence of thrombus. Normal compressibility. Venous Reflux:  None. Other Findings: Previously stented left superficial femoral artery appears occluded throughout its imaged courses (images 65 through 67) with atretic with atretic reconstitution of left popliteal artery (image 69) IMPRESSION: 1. No evidence of DVT within either lower extremity. 2. 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 Correlation for symptoms of PAD is advised. Further evaluation with CTA run-off could be as indicated. These results will be called to the ordering clinician or representative by the Radiologist Assistant, and communication documented in the PACS or  [employer] Dashboard. Electronically Signed   By: Simonne Come M.D.   On: 12/15/2018 13:54    ASSESSMENT AND PLAN:   Active Problems:   DKA (diabetic ketoacidoses) (HCC)  DKA with history of diabetes 2. resolved with IV hydration,     acute gastroenteritis. resolved.  Started on IV antibiotics secondary to enteritis and leukocytosis and possible concern for infection, CT abdomen showed enteritis.    Extensive aortoiliacosclerosis.  Vascular consulteed,   Plan for angiogram.  On Monday.  Continue heparin drip till then.  History of DVT.  The patient was on Coumadin, off Coumadin for 2 weeks due to recent GI bleeding.  Vascular suggested to start on heparin drip to check and check stool for guaiac if she has any question able bleeds.  Hypertension.  Continue home hypertension medication after resuming diet. COPD.  Stable.    All the records are reviewed and case discussed with Care Management/Social Workerr. Management plans discussed with the patient, family and they are in agreement.  CODE STATUS: Full  TOTAL TIME TAKING CARE OF THIS PATIENT: 35 minutes.     POSSIBLE D/C IN 1-2 DAYS, DEPENDING ON CLINICAL CONDITION.   Katha Hamming M.D on 12/16/2018   Between 7am to 6pm - Pager - [phone]  After 6pm go to [email or web address] - Social research officer, government  [facility]  Office  [phone]  CC: Primary care physician; Center, [facility]  Note: This dictation was prepared with Nurse, children's dictation along with smaller phrase technology. Any transcriptional errors that result from this process are unintentional.

## 2018-12-16 NOTE — Consult Note (Signed)
ANTICOAGULATION CONSULT NOTE  Pharmacy Consult for Heparin Indication: VTE treatment  Patient Measurements: Height: 5\' 7"  (170.2 cm) Weight: 147 lb (66.7 kg) IBW/kg (Calculated) : 61.6 Heparin Dosing Weight: 64.8 kg  Vital Signs: Temp: 98.2 F (36.8 C) (08/29 0332) Temp Source: Oral (08/29 0332) BP: 143/59 (08/29 0332) Pulse Rate: 66 (08/29 0332)  Labs: Recent Labs    12/14/18 1240  12/15/18 1031 12/15/18 1354 12/15/18 1926 12/16/18 0511  HGB 11.1*  --   --   --   --  9.1*  HCT 32.1*  --   --   --   --  27.2*  PLT 312  --   --   --   --  231  APTT  --   --   --  116*  --   --   LABPROT  --   --   --  13.8  --   --   INR  --   --   --  1.1  --   --   HEPARINUNFRC  --   --   --   --  0.94* 0.37  CREATININE 1.04*   < > 0.91 0.80  --  0.84   < > = values in this interval not displayed.    Estimated Creatinine Clearance: 66.7 mL/min (by C-G formula based on SCr of 0.84 mg/dL).   Medical History: Past Medical History:  Diagnosis Date  . Anxiety    h/o  . COPD (chronic obstructive pulmonary disease) (HCC)   . Diabetes mellitus without complication (HCC)   . GERD (gastroesophageal reflux disease)   . Hypertension    bp under control-off meds since 2019    Medications:  Medications Prior to Admission  Medication Sig Dispense Refill Last Dose  . albuterol (PROVENTIL HFA;VENTOLIN HFA) 108 (90 Base) MCG/ACT inhaler Inhale 2 puffs into the lungs every 6 (six) hours as needed for wheezing or shortness of breath. 1 Inhaler 2 prn at prn  . alum & mag hydroxide-simeth (MAALOX/MYLANTA) 200-200-20 MG/5ML suspension Take 30 mLs by mouth every 4 (four) hours as needed for indigestion or heartburn. 355 mL 0 prn at prn  . atorvastatin (LIPITOR) 10 MG tablet Take 1 tablet (10 mg total) by mouth daily. 90 tablet 3 12/13/2018 at Unknown time  . Biotin 1000 MCG tablet Take 1,000 tablets by mouth daily.    12/13/2018 at Unknown time  . cetirizine (ZYRTEC) 10 MG tablet Take 10 mg by mouth  daily.   12/13/2018 at Unknown time  . cholecalciferol (VITAMIN D3) 25 MCG (1000 UT) tablet Take 1,000 Units by mouth daily.   12/13/2018 at Unknown time  . iron polysaccharides (NIFEREX) 150 MG capsule Take 1 capsule (150 mg total) by mouth daily. 30 capsule 3 12/13/2018 at Unknown time  . Multiple Vitamins-Minerals (WOMENS MULTIVITAMIN PO) Take 1 tablet by mouth daily.   12/13/2018 at Unknown time  . pantoprazole (PROTONIX) 40 MG tablet Take 1 tablet (40 mg total) by mouth daily. 30 tablet 0 12/13/2018 at Unknown time  . sitaGLIPtin (JANUVIA) 100 MG tablet Take 100 mg by mouth daily.   12/13/2018 at Unknown time  . vitamin C (ASCORBIC ACID) 500 MG tablet Take 500 mg by mouth daily.   12/13/2018 at Unknown time  . warfarin (COUMADIN) 5 MG tablet Take 1 tablet (5 mg total) by mouth daily at 6 PM. 30 tablet 5 12/13/2018 at Unknown time   Scheduled:  . atorvastatin  10 mg Oral Daily  . insulin aspart  0-20 Units Subcutaneous TID WC  . insulin aspart  0-5 Units Subcutaneous QHS  . insulin aspart  0-9 Units Subcutaneous TID WC  . linagliptin  5 mg Oral Daily  . sodium chloride flush  3 mL Intravenous Q12H   Infusions:  . sodium chloride 75 mL/hr at 12/16/18 0646  . [START ON 12/18/2018] sodium chloride    . ciprofloxacin Stopped (12/15/18 2124)  . heparin 800 Units/hr (12/15/18 2015)  . metronidazole 500 mg (12/16/18 0208)   PRN: acetaminophen, morphine injection, ondansetron (ZOFRAN) IV Anti-infectives (From admission, onward)   Start     Dose/Rate Route Frequency Ordered Stop   12/15/18 2000  ciprofloxacin (CIPRO) IVPB 400 mg     400 mg 200 mL/hr over 60 Minutes Intravenous Every 12 hours 12/15/18 1811     12/15/18 1800  metroNIDAZOLE (FLAGYL) IVPB 500 mg     500 mg 100 mL/hr over 60 Minutes Intravenous Every 8 hours 12/15/18 1751        Assessment: History of DVT.  The patient was on Coumadin, off Coumadin for 2 weeks due to recent GI bleeding. Vascular suggested to start heparin and  holding coumadin for now. Pt took warfarin 5 mg daily PTA.   Goal of Therapy:  Heparin level 0.3-0.7 units/ml Monitor platelets by anticoagulation protocol: Yes   Heparin Course: 8/28 initiation 3800 units bolus, then 950 units/hr 8/28 1926 HL 0.94: dec to 800 units/hr   Plan:  08/29 @ 0500 HL 0.37 therapeutic. Will continue current rate and will recheck HL @ 1200, hgb going down will continue to monitor.  Tobie Lords, PharmD 12/16/2018,7:21 AM

## 2018-12-16 NOTE — Plan of Care (Signed)
  Problem: Education: Goal: Knowledge of General Education information will improve Description: Including pain rating scale, medication(s)/side effects and non-pharmacologic comfort measures 12/16/2018 0338 by Liliane Channel, RN Outcome: Progressing 12/16/2018 0325 by Liliane Channel, RN Outcome: Progressing   Problem: Tissue Perfusion: Goal: Adequacy of tissue perfusion will improve 12/16/2018 0338 by Liliane Channel, RN Outcome: Progressing 12/16/2018 0325 by Liliane Channel, RN Outcome: Progressing

## 2018-12-16 NOTE — Consult Note (Signed)
ANTICOAGULATION CONSULT NOTE  Pharmacy Consult for Heparin Indication: VTE treatment  Patient Measurements: Height: 5\' 7"  (170.2 cm) Weight: 147 lb (66.7 kg) IBW/kg (Calculated) : 61.6 Heparin Dosing Weight: 64.8 kg  Vital Signs: Temp: 97.9 F (36.6 C) (08/29 0724) Temp Source: Oral (08/29 0724) BP: 141/65 (08/29 0724) Pulse Rate: 62 (08/29 0724)  Labs: Recent Labs    12/14/18 1240  12/15/18 1031 12/15/18 1354 12/15/18 1926 12/16/18 0511 12/16/18 1223  HGB 11.1*  --   --   --   --  9.1*  --   HCT 32.1*  --   --   --   --  27.2*  --   PLT 312  --   --   --   --  231  --   APTT  --   --   --  116*  --   --   --   LABPROT  --   --   --  13.8  --   --   --   INR  --   --   --  1.1  --   --   --   HEPARINUNFRC  --   --   --   --  0.94* 0.37 0.33  CREATININE 1.04*   < > 0.91 0.80  --  0.84  --    < > = values in this interval not displayed.    Estimated Creatinine Clearance: 66.7 mL/min (by C-G formula based on SCr of 0.84 mg/dL).   Medical History: Past Medical History:  Diagnosis Date  . Anxiety    h/o  . COPD (chronic obstructive pulmonary disease) ([place])   . Diabetes mellitus without complication ([place])   . GERD (gastroesophageal reflux disease)   . Hypertension    bp under control-off meds since 2019    Medications:  Medications Prior to Admission  Medication Sig Dispense Refill Last Dose  . albuterol (PROVENTIL HFA;VENTOLIN HFA) 108 (90 Base) MCG/ACT inhaler Inhale 2 puffs into the lungs every 6 (six) hours as needed for wheezing or shortness of breath. 1 Inhaler 2 prn at prn  . alum & mag hydroxide-simeth (MAALOX/MYLANTA) 200-200-20 MG/5ML suspension Take 30 mLs by mouth every 4 (four) hours as needed for indigestion or heartburn. 355 mL 0 prn at prn  . atorvastatin (LIPITOR) 10 MG tablet Take 1 tablet (10 mg total) by mouth daily. 90 tablet 3 12/13/2018 at Unknown time  . Biotin 1000 MCG tablet Take 1,000 tablets by mouth daily.    12/13/2018 at Unknown time   . cetirizine (ZYRTEC) 10 MG tablet Take 10 mg by mouth daily.   12/13/2018 at Unknown time  . cholecalciferol (VITAMIN D3) 25 MCG (1000 UT) tablet Take 1,000 Units by mouth daily.   12/13/2018 at Unknown time  . iron polysaccharides (NIFEREX) 150 MG capsule Take 1 capsule (150 mg total) by mouth daily. 30 capsule 3 12/13/2018 at Unknown time  . Multiple Vitamins-Minerals (WOMENS MULTIVITAMIN PO) Take 1 tablet by mouth daily.   12/13/2018 at Unknown time  . pantoprazole (PROTONIX) 40 MG tablet Take 1 tablet (40 mg total) by mouth daily. 30 tablet 0 12/13/2018 at Unknown time  . sitaGLIPtin (JANUVIA) 100 MG tablet Take 100 mg by mouth daily.   12/13/2018 at Unknown time  . vitamin C (ASCORBIC ACID) 500 MG tablet Take 500 mg by mouth daily.   12/13/2018 at Unknown time  . warfarin (COUMADIN) 5 MG tablet Take 1 tablet (5 mg total) by mouth daily at  6 PM. 30 tablet 5 12/13/2018 at Unknown time   Scheduled:  . atorvastatin  10 mg Oral Daily  . insulin aspart  0-20 Units Subcutaneous TID WC  . insulin aspart  0-5 Units Subcutaneous QHS  . insulin aspart  0-9 Units Subcutaneous TID WC  . linagliptin  5 mg Oral Daily  . metoCLOPramide  5 mg Oral TID AC  . sodium chloride flush  3 mL Intravenous Q12H   Infusions:  . sodium chloride 75 mL/hr at 12/16/18 0646  . [START ON 12/18/2018] sodium chloride    . ciprofloxacin 400 mg (12/16/18 0816)  . heparin 800 Units/hr (12/15/18 2015)  . metronidazole 500 mg (12/16/18 0208)   PRN: acetaminophen, morphine injection, ondansetron (ZOFRAN) IV Anti-infectives (From admission, onward)   Start     Dose/Rate Route Frequency Ordered Stop   12/15/18 2000  ciprofloxacin (CIPRO) IVPB 400 mg     400 mg 200 mL/hr over 60 Minutes Intravenous Every 12 hours 12/15/18 1811     12/15/18 1800  metroNIDAZOLE (FLAGYL) IVPB 500 mg     500 mg 100 mL/hr over 60 Minutes Intravenous Every 8 hours 12/15/18 1751        Assessment: History of DVT.  The patient was on Coumadin, off  Coumadin for 2 weeks due to recent GI bleeding. Vascular suggested to start heparin and holding coumadin for now. Pt took warfarin 5 mg daily PTA.   Goal of Therapy:  Heparin level 0.3-0.7 units/ml Monitor platelets by anticoagulation protocol: Yes   Heparin Course: 8/28 initiation 3800 units bolus, then 950 units/hr 8/28 1926 HL 0.94: dec to 800 units/hr 08/29 @ 0500 HL 0.37 therapeutic. Will continue current rate of 800 units/hr   Plan:  08/29 @ 12230 HL 0.33 therapeutic. Will continue current rate and will recheck HL with am labs. hgb going down will continue to monitor.  Dasani Thurlow A, PharmD 12/16/2018,1:18 PM

## 2018-12-17 LAB — CBC
HCT: 26.7 % — ABNORMAL LOW (ref 36.0–46.0)
Hemoglobin: 9 g/dL — ABNORMAL LOW (ref 12.0–15.0)
MCH: 27.7 pg (ref 26.0–34.0)
MCHC: 33.7 g/dL (ref 30.0–36.0)
MCV: 82.2 fL (ref 80.0–100.0)
Platelets: 240 10*3/uL (ref 150–400)
RBC: 3.25 MIL/uL — ABNORMAL LOW (ref 3.87–5.11)
RDW: 13.6 % (ref 11.5–15.5)
WBC: 7.7 10*3/uL (ref 4.0–10.5)
nRBC: 0 % (ref 0.0–0.2)

## 2018-12-17 LAB — HEPARIN LEVEL (UNFRACTIONATED)
Heparin Unfractionated: 0.27 IU/mL — ABNORMAL LOW (ref 0.30–0.70)
Heparin Unfractionated: 0.33 IU/mL (ref 0.30–0.70)
Heparin Unfractionated: 0.34 IU/mL (ref 0.30–0.70)

## 2018-12-17 LAB — GLUCOSE, CAPILLARY
Glucose-Capillary: 137 mg/dL — ABNORMAL HIGH (ref 70–99)
Glucose-Capillary: 178 mg/dL — ABNORMAL HIGH (ref 70–99)
Glucose-Capillary: 101 mg/dL — ABNORMAL HIGH (ref 70–99)
Glucose-Capillary: 136 mg/dL — ABNORMAL HIGH (ref 70–99)

## 2018-12-17 MED ORDER — INSULIN ASPART 100 UNIT/ML ~~LOC~~ SOLN
SUBCUTANEOUS | Status: AC
  Filled 2018-12-17: qty 1

## 2018-12-17 NOTE — Consult Note (Signed)
ANTICOAGULATION CONSULT NOTE  Pharmacy Consult for Heparin Indication: VTE treatment  Patient Measurements: Height: 5\' 7"  (170.2 cm) Weight: 143 lb 15.7 oz (65.3 kg) IBW/kg (Calculated) : 61.6 Heparin Dosing Weight: 64.8 kg  Vital Signs: Temp: 98.3 F (36.8 C) (08/30 1551) Temp Source: Oral (08/30 1551) BP: 155/71 (08/30 1551) Pulse Rate: 78 (08/30 1551)  Labs: Recent Labs    12/15/18 1031 12/15/18 1354  12/16/18 0511  12/17/18 0527 12/17/18 1308 12/17/18 1915  HGB  --   --   --  9.1*  --  9.0*  --   --   HCT  --   --   --  27.2*  --  26.7*  --   --   PLT  --   --   --  231  --  240  --   --   APTT  --  116*  --   --   --   --   --   --   LABPROT  --  13.8  --   --   --   --   --   --   INR  --  1.1  --   --   --   --   --   --   HEPARINUNFRC  --   --    < > 0.37   < > 0.27* 0.33 0.34  CREATININE 0.91 0.80  --  0.84  --   --   --   --    < > = values in this interval not displayed.    Estimated Creatinine Clearance: 66.7 mL/min (by C-G formula based on SCr of 0.84 mg/dL).   Medical History: Past Medical History:  Diagnosis Date  . Anxiety    h/o  . COPD (chronic obstructive pulmonary disease) ([place])   . Diabetes mellitus without complication ([place])   . GERD (gastroesophageal reflux disease)   . Hypertension    bp under control-off meds since 2019    Medications:  Medications Prior to Admission  Medication Sig Dispense Refill Last Dose  . albuterol (PROVENTIL HFA;VENTOLIN HFA) 108 (90 Base) MCG/ACT inhaler Inhale 2 puffs into the lungs every 6 (six) hours as needed for wheezing or shortness of breath. 1 Inhaler 2 prn at prn  . alum & mag hydroxide-simeth (MAALOX/MYLANTA) 200-200-20 MG/5ML suspension Take 30 mLs by mouth every 4 (four) hours as needed for indigestion or heartburn. 355 mL 0 prn at prn  . atorvastatin (LIPITOR) 10 MG tablet Take 1 tablet (10 mg total) by mouth daily. 90 tablet 3 12/13/2018 at Unknown time  . Biotin 1000 MCG tablet Take 1,000  tablets by mouth daily.    12/13/2018 at Unknown time  . cetirizine (ZYRTEC) 10 MG tablet Take 10 mg by mouth daily.   12/13/2018 at Unknown time  . cholecalciferol (VITAMIN D3) 25 MCG (1000 UT) tablet Take 1,000 Units by mouth daily.   12/13/2018 at Unknown time  . iron polysaccharides (NIFEREX) 150 MG capsule Take 1 capsule (150 mg total) by mouth daily. 30 capsule 3 12/13/2018 at Unknown time  . Multiple Vitamins-Minerals (WOMENS MULTIVITAMIN PO) Take 1 tablet by mouth daily.   12/13/2018 at Unknown time  . pantoprazole (PROTONIX) 40 MG tablet Take 1 tablet (40 mg total) by mouth daily. 30 tablet 0 12/13/2018 at Unknown time  . sitaGLIPtin (JANUVIA) 100 MG tablet Take 100 mg by mouth daily.   12/13/2018 at Unknown time  . vitamin C (ASCORBIC ACID) 500 MG tablet Take  500 mg by mouth daily.   12/13/2018 at Unknown time  . warfarin (COUMADIN) 5 MG tablet Take 1 tablet (5 mg total) by mouth daily at 6 PM. 30 tablet 5 12/13/2018 at Unknown time   Scheduled:  . atorvastatin  10 mg Oral Daily  . docusate sodium  100 mg Oral BID  . insulin aspart  0-5 Units Subcutaneous QHS  . insulin aspart  0-9 Units Subcutaneous TID WC  . linagliptin  5 mg Oral Daily  . metoCLOPramide  5 mg Oral TID AC  . sodium chloride flush  3 mL Intravenous Q12H   Infusions:  . sodium chloride 75 mL/hr at 12/17/18 1826  . [START ON 12/18/2018] sodium chloride    . ciprofloxacin 400 mg (12/17/18 1949)  . heparin 900 Units/hr (12/17/18 1825)  . metronidazole 500 mg (12/17/18 1719)   PRN: acetaminophen, morphine injection, ondansetron (ZOFRAN) IV Anti-infectives (From admission, onward)   Start     Dose/Rate Route Frequency Ordered Stop   12/15/18 2000  ciprofloxacin (CIPRO) IVPB 400 mg     400 mg 200 mL/hr over 60 Minutes Intravenous Every 12 hours 12/15/18 1811     12/15/18 1800  metroNIDAZOLE (FLAGYL) IVPB 500 mg     500 mg 100 mL/hr over 60 Minutes Intravenous Every 8 hours 12/15/18 1751        Assessment: History of  DVT.  The patient was on Coumadin, off Coumadin for 2 weeks due to recent GI bleeding. Vascular suggested to start heparin and holding coumadin for now. Pt took warfarin 5 mg daily PTA.   Goal of Therapy:  Heparin level 0.3-0.7 units/ml Monitor platelets by anticoagulation protocol: Yes   Heparin Course: 8/28 initiation 3800 units bolus, then 950 units/hr 8/28 1926 HL 0.94: dec to 800 units/hr 08/29 @ 0500 HL 0.37 therapeutic. Will continue current rate of 800 units/hr 08/30 @ 0500 HL 0.27 subtherapeutic. Will increase rate to 900 units/hr  08/30 @ 1308 HL 0.33 therapeutic  Plan:  08/30@1915  HL 0.34 therapeutic. Will continue rate of 900 units/hr and will recheck HL with AM labs.   Bettey Costa, PharmD 12/17/2018,8:04 PM

## 2018-12-17 NOTE — Progress Notes (Signed)
[facility] - [place] [facility]   PATIENT NAME: Sylvia SensingRoberta [name]    MR#:  [id number]  DATE OF BIRTH:  07/08/1955  SUBJECTIVE: Complains of nausea but no abdominal pain.  CHIEF COMPLAINT:   Chief Complaint  Patient presents with  . Abdominal Pain   On heparin drip, possible angiogram of legs on Monday.  REVIEW OF SYSTEMS:  CONSTITUTIONAL: No fever, fatigue or weakness.  EYES: No blurred or double vision.  EARS, NOSE, AND THROAT: No tinnitus or ear pain.  RESPIRATORY: No cough, shortness of breath, wheezing or hemoptysis.  CARDIOVASCULAR: No chest pain, orthopnea, edema.  GASTROINTESTINAL: Patient has nausea but no vomiting, diarrhea.  GENITOURINARY: No dysuria, hematuria.  ENDOCRINE: No polyuria, nocturia,  HEMATOLOGY: No anemia, easy bruising or bleeding SKIN: No rash or lesion. MUSCULOSKELETAL: No joint pain or arthritis.   NEUROLOGIC: No tingling, numbness, weakness.  PSYCHIATRY: No anxiety or depression.     DRUG ALLERGIES:   Allergies  Allergen Reactions  . Penicillins Hives    Has patient had a PCN reaction causing immediate rash, facial/tongue/throat swelling, SOB or lightheadedness with hypotension: Yes Has patient had a PCN reaction causing severe rash involving mucus membranes or skin necrosis: No Has patient had a PCN reaction that required hospitalization: No Has patient had a PCN reaction occurring within the last 10 years: No If all of the above answers are "NO", then may proceed with Cephalosporin use.  . Tramadol Itching    VITALS:  Blood pressure (!) 157/70, pulse 72, temperature 98.6 F (37 C), temperature source Oral, resp. rate 19, height 5\' 7"  (1.702 m), weight 65.3 kg, SpO2 100 %.  PHYSICAL EXAMINATION:  GENERAL:  63 y.o.-year-old patient lying in the bed with no acute distress.  EYES: Pupils equal, round, reactive to light and accommodation. No scleral icterus. Extraocular muscles intact.  HEENT: Head atraumatic,  normocephalic. Oropharynx and nasopharynx clear.  NECK:  Supple, no jugular venous distention. No thyroid enlargement, no tenderness.  LUNGS: Normal breath sounds bilaterally, no wheezing, rales,rhonchi or crepitation. No use of accessory muscles of respiration.  CARDIOVASCULAR: S1, S2 normal. No murmurs, rubs, or gallops.  ABDOMEN: Soft, nontender, nondistended. Bowel sounds present. No organomegaly or mass.  EXTREMITIES: No pedal edema, cyanosis, or clubbing.  NEUROLOGIC: Cranial nerves II through XII are intact. Muscle strength 4/5 in all extremities. Sensation intact. Gait not checked.  PSYCHIATRIC: The patient is alert and oriented x 3.  SKIN: No obvious rash, lesion, or ulcer.   Physical Exam LABORATORY PANEL:   CBC Recent Labs  Lab 12/17/18 0527  WBC 7.7  HGB 9.0*  HCT 26.7*  PLT 240   ------------------------------------------------------------------------------------------------------------------  Chemistries  Recent Labs  Lab 12/14/18 1240  12/16/18 0511  NA 141   < > 141  K 3.8   < > 3.4*  CL 104   < > 112*  CO2 20*   < > 23  GLUCOSE 291*   < > 127*  BUN 14   < > 9  CREATININE 1.04*   < > 0.84  CALCIUM 10.8*   < > 8.7*  AST 17  --   --   ALT 15  --   --   ALKPHOS 86  --   --   BILITOT 1.2  --   --    < > = values in this interval not displayed.   ------------------------------------------------------------------------------------------------------------------  Cardiac Enzymes No results for input(s): TROPONINI in the last 168 hours. ------------------------------------------------------------------------------------------------------------------  RADIOLOGY:  [place] Venous  Img Lower Bilateral  Result Date: 12/15/2018 CLINICAL DATA:  Bilateral lower extremity edema. History of previous DVT. Patient recently came off of anticoagulation. Evaluate for acute or chronic DVT. EXAM: BILATERAL LOWER EXTREMITY VENOUS DOPPLER ULTRASOUND TECHNIQUE: Gray-scale sonography  with graded compression, as well as color Doppler and duplex ultrasound were performed to evaluate the lower extremity deep venous systems from the level of the common femoral vein and including the common femoral, femoral, profunda femoral, popliteal and calf veins including the posterior tibial, peroneal and gastrocnemius veins when visible. The superficial great saphenous vein was also interrogated. Spectral Doppler was utilized to evaluate flow at rest and with distal augmentation maneuvers in the common femoral, femoral and popliteal veins. COMPARISON:  CT abdomen and pelvis-12/14/2018; 06/13/2018 FINDINGS: RIGHT LOWER EXTREMITY Common Femoral Vein: No evidence of thrombus. Normal compressibility, respiratory phasicity and response to augmentation. Saphenofemoral Junction: No evidence of thrombus. Normal compressibility and flow on color Doppler imaging. Profunda Femoral Vein: No evidence of thrombus. Normal compressibility and flow on color Doppler imaging. Femoral Vein: No evidence of thrombus. Normal compressibility, respiratory phasicity and response to augmentation. Popliteal Vein: No evidence of thrombus. Normal compressibility, respiratory phasicity and response to augmentation. Calf Veins: No evidence of thrombus. Normal compressibility and flow on color Doppler imaging. Superficial Great Saphenous Vein: No evidence of thrombus. Normal compressibility. Venous Reflux:  None. Other Findings: Previously stented right superficial femoral artery appears occluded throughout its imaged course (representative images 31 through 34) with atretic reconstitution of the right popliteal artery (image 35). LEFT LOWER EXTREMITY Common Femoral Vein: No evidence of thrombus. Normal compressibility, respiratory phasicity and response to augmentation. Saphenofemoral Junction: No evidence of thrombus. Normal compressibility and flow on color Doppler imaging. Profunda Femoral Vein: No evidence of thrombus. Normal  compressibility and flow on color Doppler imaging. Femoral Vein: No evidence of thrombus. Normal compressibility, respiratory phasicity and response to augmentation. Popliteal Vein: No evidence of thrombus. Normal compressibility, respiratory phasicity and response to augmentation. Calf Veins: No evidence of thrombus. Normal compressibility and flow on color Doppler imaging. Superficial Great Saphenous Vein: No evidence of thrombus. Normal compressibility. Venous Reflux:  None. Other Findings: Previously stented left superficial femoral artery appears occluded throughout its imaged courses (images 65 through 41) with atretic with atretic reconstitution of left popliteal artery (image 69) IMPRESSION: 1. No evidence of DVT within either lower extremity. 2. 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 Correlation for symptoms of PAD is advised. Further evaluation with CTA run-off could be as indicated. These results will be called to the ordering clinician or representative by the Radiologist Assistant, and communication documented in the PACS or zVision Dashboard. Electronically Signed   By: Sandi Mariscal M.D.   On: 12/15/2018 13:54    ASSESSMENT AND PLAN:   Active Problems:   DKA (diabetic ketoacidoses) ([place])  DKA with history of diabetes 2. resolved with IV hydration,   acute gastroenteritis. resolved.  Started on IV antibiotics secondary to enteritis and leukocytosis and possible concern for infection, CT abdomen showed enteritis. Has some nausea secondary to Flagyl.  Patient has nausea for last 3 to 4 days so it is unlikely diabetic gastroparesis.   Extensive aortoiliacosclerosis.  Vascular consulteed,   Plan for angiogram.  On Monday.  Continue heparin drip till then.  History of DVT.  The patient was on Coumadin, off Coumadin for 2 weeks due to recent GI bleeding.  Vascular suggested to start on heparin drip to check and check stool for guaiac  if she has any question able bleeds.  Hypertension.  Continue home hypertension medication after resuming diet. COPD.  Stable.  Plan for disposition depending upon angiogram results.  Continue antibiotics till tomorrow.  Leukocytosis normalized.  All the records are reviewed and case discussed with [name] Management/Social Workerr. Management plans discussed with the patient, family and they are in agreement.  CODE STATUS: Full  TOTAL TIME TAKING [name] OF THIS PATIENT: 35 minutes.     POSSIBLE D/C IN 1-2 DAYS, DEPENDING ON CLINICAL CONDITION.   Katha Hamming M.D on 12/17/2018   Between 7am to 6pm - Pager - [phone]  After 6pm go to [email or web address] - Social research officer, government  [facility]  Office  [phone]  CC: Primary [name] physician; Center, [facility]  Note: This dictation was prepared with Nurse, children's dictation along with smaller phrase technology. Any transcriptional errors that result from this process are unintentional.

## 2018-12-17 NOTE — Consult Note (Signed)
ANTICOAGULATION CONSULT NOTE  Pharmacy Consult for Heparin Indication: VTE treatment  Patient Measurements: Height: 5\' 7"  (170.2 cm) Weight: 143 lb 15.7 oz (65.3 kg) IBW/kg (Calculated) : 61.6 Heparin Dosing Weight: 64.8 kg  Vital Signs: Temp: 98.6 F (37 C) (08/30 0846) Temp Source: Oral (08/30 0846) BP: 157/70 (08/30 0846) Pulse Rate: 72 (08/30 0846)  Labs: Recent Labs    12/15/18 1031 12/15/18 1354  12/16/18 0511 12/16/18 1223 12/17/18 0527 12/17/18 1308  HGB  --   --   --  9.1*  --  9.0*  --   HCT  --   --   --  27.2*  --  26.7*  --   PLT  --   --   --  231  --  240  --   APTT  --  116*  --   --   --   --   --   LABPROT  --  13.8  --   --   --   --   --   INR  --  1.1  --   --   --   --   --   HEPARINUNFRC  --   --    < > 0.37 0.33 0.27* 0.33  CREATININE 0.91 0.80  --  0.84  --   --   --    < > = values in this interval not displayed.    Estimated Creatinine Clearance: 66.7 mL/min (by C-G formula based on SCr of 0.84 mg/dL).   Medical History: Past Medical History:  Diagnosis Date  . Anxiety    h/o  . COPD (chronic obstructive pulmonary disease) ([facility])   . Diabetes mellitus without complication ([facility])   . GERD (gastroesophageal reflux disease)   . Hypertension    bp under control-off meds since 2019    Medications:  Medications Prior to Admission  Medication Sig Dispense Refill Last Dose  . albuterol (PROVENTIL HFA;VENTOLIN HFA) 108 (90 Base) MCG/ACT inhaler Inhale 2 puffs into the lungs every 6 (six) hours as needed for wheezing or shortness of breath. 1 Inhaler 2 prn at prn  . alum & mag hydroxide-simeth (MAALOX/MYLANTA) 200-200-20 MG/5ML suspension Take 30 mLs by mouth every 4 (four) hours as needed for indigestion or heartburn. 355 mL 0 prn at prn  . atorvastatin (LIPITOR) 10 MG tablet Take 1 tablet (10 mg total) by mouth daily. 90 tablet 3 12/13/2018 at Unknown time  . Biotin 1000 MCG tablet Take 1,000 tablets by mouth daily.    12/13/2018 at Unknown  time  . cetirizine (ZYRTEC) 10 MG tablet Take 10 mg by mouth daily.   12/13/2018 at Unknown time  . cholecalciferol (VITAMIN D3) 25 MCG (1000 UT) tablet Take 1,000 Units by mouth daily.   12/13/2018 at Unknown time  . iron polysaccharides (NIFEREX) 150 MG capsule Take 1 capsule (150 mg total) by mouth daily. 30 capsule 3 12/13/2018 at Unknown time  . Multiple Vitamins-Minerals (WOMENS MULTIVITAMIN PO) Take 1 tablet by mouth daily.   12/13/2018 at Unknown time  . pantoprazole (PROTONIX) 40 MG tablet Take 1 tablet (40 mg total) by mouth daily. 30 tablet 0 12/13/2018 at Unknown time  . sitaGLIPtin (JANUVIA) 100 MG tablet Take 100 mg by mouth daily.   12/13/2018 at Unknown time  . vitamin C (ASCORBIC ACID) 500 MG tablet Take 500 mg by mouth daily.   12/13/2018 at Unknown time  . warfarin (COUMADIN) 5 MG tablet Take 1 tablet (5 mg total) by mouth  daily at 6 PM. 30 tablet 5 12/13/2018 at Unknown time   Scheduled:  . atorvastatin  10 mg Oral Daily  . docusate sodium  100 mg Oral BID  . insulin aspart  0-5 Units Subcutaneous QHS  . insulin aspart  0-9 Units Subcutaneous TID WC  . linagliptin  5 mg Oral Daily  . metoCLOPramide  5 mg Oral TID AC  . sodium chloride flush  3 mL Intravenous Q12H   Infusions:  . sodium chloride 75 mL/hr at 12/17/18 0119  . [START ON 12/18/2018] sodium chloride    . ciprofloxacin 400 mg (12/17/18 1054)  . heparin 900 Units/hr (12/17/18 4765)  . metronidazole 500 mg (12/17/18 0908)   PRN: acetaminophen, morphine injection, ondansetron (ZOFRAN) IV Anti-infectives (From admission, onward)   Start     Dose/Rate Route Frequency Ordered Stop   12/15/18 2000  ciprofloxacin (CIPRO) IVPB 400 mg     400 mg 200 mL/hr over 60 Minutes Intravenous Every 12 hours 12/15/18 1811     12/15/18 1800  metroNIDAZOLE (FLAGYL) IVPB 500 mg     500 mg 100 mL/hr over 60 Minutes Intravenous Every 8 hours 12/15/18 1751        Assessment: History of DVT.  The patient was on Coumadin, off Coumadin  for 2 weeks due to recent GI bleeding. Vascular suggested to start heparin and holding coumadin for now. Pt took warfarin 5 mg daily PTA.   Goal of Therapy:  Heparin level 0.3-0.7 units/ml Monitor platelets by anticoagulation protocol: Yes   Heparin Course: 8/28 initiation 3800 units bolus, then 950 units/hr 8/28 1926 HL 0.94: dec to 800 units/hr 08/29 @ 0500 HL 0.37 therapeutic. Will continue current rate of 800 units/hr 08/30 @ 0500 HL 0.27 subtherapeutic. Will increase rate to 900 units/hr    Plan:  08/30 @ 1308 HL 0.33 therapeutic. Will continue rate of 900 units/hr and will recheck HL in 6 hrs.   Keyna Blizard A, PharmD 12/17/2018,1:39 PM

## 2018-12-17 NOTE — Consult Note (Signed)
ANTICOAGULATION CONSULT NOTE  Pharmacy Consult for Heparin Indication: VTE treatment  Patient Measurements: Height: 5\' 7"  (170.2 cm) Weight: 143 lb 15.7 oz (65.3 kg) IBW/kg (Calculated) : 61.6 Heparin Dosing Weight: 64.8 kg  Vital Signs: Temp: 99.3 F (37.4 C) (08/29 2008) Temp Source: Oral (08/29 2008) BP: 138/71 (08/30 0401) Pulse Rate: 69 (08/30 0401)  Labs: Recent Labs    12/14/18 1240  12/15/18 1031 12/15/18 1354  12/16/18 0511 12/16/18 1223 12/17/18 0527  HGB 11.1*  --   --   --   --  9.1*  --  9.0*  HCT 32.1*  --   --   --   --  27.2*  --  26.7*  PLT 312  --   --   --   --  231  --  240  APTT  --   --   --  116*  --   --   --   --   LABPROT  --   --   --  13.8  --   --   --   --   INR  --   --   --  1.1  --   --   --   --   HEPARINUNFRC  --   --   --   --    < > 0.37 0.33 0.27*  CREATININE 1.04*   < > 0.91 0.80  --  0.84  --   --    < > = values in this interval not displayed.    Estimated Creatinine Clearance: 66.7 mL/min (by C-G formula based on SCr of 0.84 mg/dL).   Medical History: Past Medical History:  Diagnosis Date  . Anxiety    h/o  . COPD (chronic obstructive pulmonary disease) ([facility])   . Diabetes mellitus without complication ([facility])   . GERD (gastroesophageal reflux disease)   . Hypertension    bp under control-off meds since 2019    Medications:  Medications Prior to Admission  Medication Sig Dispense Refill Last Dose  . albuterol (PROVENTIL HFA;VENTOLIN HFA) 108 (90 Base) MCG/ACT inhaler Inhale 2 puffs into the lungs every 6 (six) hours as needed for wheezing or shortness of breath. 1 Inhaler 2 prn at prn  . alum & mag hydroxide-simeth (MAALOX/MYLANTA) 200-200-20 MG/5ML suspension Take 30 mLs by mouth every 4 (four) hours as needed for indigestion or heartburn. 355 mL 0 prn at prn  . atorvastatin (LIPITOR) 10 MG tablet Take 1 tablet (10 mg total) by mouth daily. 90 tablet 3 12/13/2018 at Unknown time  . Biotin 1000 MCG tablet Take 1,000  tablets by mouth daily.    12/13/2018 at Unknown time  . cetirizine (ZYRTEC) 10 MG tablet Take 10 mg by mouth daily.   12/13/2018 at Unknown time  . cholecalciferol (VITAMIN D3) 25 MCG (1000 UT) tablet Take 1,000 Units by mouth daily.   12/13/2018 at Unknown time  . iron polysaccharides (NIFEREX) 150 MG capsule Take 1 capsule (150 mg total) by mouth daily. 30 capsule 3 12/13/2018 at Unknown time  . Multiple Vitamins-Minerals (WOMENS MULTIVITAMIN PO) Take 1 tablet by mouth daily.   12/13/2018 at Unknown time  . pantoprazole (PROTONIX) 40 MG tablet Take 1 tablet (40 mg total) by mouth daily. 30 tablet 0 12/13/2018 at Unknown time  . sitaGLIPtin (JANUVIA) 100 MG tablet Take 100 mg by mouth daily.   12/13/2018 at Unknown time  . vitamin C (ASCORBIC ACID) 500 MG tablet Take 500 mg by mouth daily.  12/13/2018 at Unknown time  . warfarin (COUMADIN) 5 MG tablet Take 1 tablet (5 mg total) by mouth daily at 6 PM. 30 tablet 5 12/13/2018 at Unknown time   Scheduled:  . atorvastatin  10 mg Oral Daily  . docusate sodium  100 mg Oral BID  . insulin aspart  0-5 Units Subcutaneous QHS  . insulin aspart  0-9 Units Subcutaneous TID WC  . linagliptin  5 mg Oral Daily  . metoCLOPramide  5 mg Oral TID AC  . sodium chloride flush  3 mL Intravenous Q12H   Infusions:  . sodium chloride 75 mL/hr at 12/17/18 0119  . [START ON 12/18/2018] sodium chloride    . ciprofloxacin Stopped (12/16/18 2140)  . heparin 800 Units/hr (12/16/18 1319)  . metronidazole Stopped (12/17/18 0221)   PRN: acetaminophen, morphine injection, ondansetron (ZOFRAN) IV Anti-infectives (From admission, onward)   Start     Dose/Rate Route Frequency Ordered Stop   12/15/18 2000  ciprofloxacin (CIPRO) IVPB 400 mg     400 mg 200 mL/hr over 60 Minutes Intravenous Every 12 hours 12/15/18 1811     12/15/18 1800  metroNIDAZOLE (FLAGYL) IVPB 500 mg     500 mg 100 mL/hr over 60 Minutes Intravenous Every 8 hours 12/15/18 1751        Assessment: History  of DVT.  The patient was on Coumadin, off Coumadin for 2 weeks due to recent GI bleeding. Vascular suggested to start heparin and holding coumadin for now. Pt took warfarin 5 mg daily PTA.   Goal of Therapy:  Heparin level 0.3-0.7 units/ml Monitor platelets by anticoagulation protocol: Yes   Heparin Course: 8/28 initiation 3800 units bolus, then 950 units/hr 8/28 1926 HL 0.94: dec to 800 units/hr 08/29 @ 0500 HL 0.37 therapeutic. Will continue current rate of 800 units/hr   Plan:  08/30 @ 0500 HL 0.27 subtherapeutic. Will increase rate to 900 units/hr and will recheck HL @ 1200 CBC trending down will continue to monitor.  Tobie Lords, PharmD 12/17/2018,6:31 AM

## 2018-12-17 NOTE — Plan of Care (Signed)
  Problem: Education: Goal: Knowledge of General Education information will improve Description: Including pain rating scale, medication(s)/side effects and non-pharmacologic comfort measures 12/17/2018 0008 by Liliane Channel, RN Outcome: Progressing 12/17/2018 0005 by Liliane Channel, RN Outcome: Progressing   Problem: Coping: Goal: Ability to adjust to condition or change in health will improve Outcome: Progressing   Problem: Metabolic: Goal: Ability to maintain appropriate glucose levels will improve 12/17/2018 0008 by Liliane Channel, RN Outcome: Progressing 12/17/2018 0005 by Liliane Channel, RN Outcome: Progressing

## 2018-12-18 ENCOUNTER — Encounter: Payer: Self-pay | Admitting: *Deleted

## 2018-12-18 ENCOUNTER — Encounter
Admission: EM | Disposition: A | Discharge status: Home Health | Payer: Self-pay | Source: Home / Self Care | Attending: Internal Medicine

## 2018-12-18 DIAGNOSIS — I7409 Other arterial embolism and thrombosis of abdominal aorta: Secondary | ICD-10-CM

## 2018-12-18 DIAGNOSIS — I743 Embolism and thrombosis of arteries of the lower extremities: Secondary | ICD-10-CM

## 2018-12-18 DIAGNOSIS — Z95820 Peripheral vascular angioplasty status with implants and grafts: Secondary | ICD-10-CM

## 2018-12-18 DIAGNOSIS — I70223 Atherosclerosis of native arteries of extremities with rest pain, bilateral legs: Secondary | ICD-10-CM

## 2018-12-18 HISTORY — PX: LOWER EXTREMITY ANGIOGRAPHY: CATH118251

## 2018-12-18 LAB — CBC
HCT: 25.4 % — ABNORMAL LOW (ref 36.0–46.0)
Hemoglobin: 8.7 g/dL — ABNORMAL LOW (ref 12.0–15.0)
MCH: 28.2 pg (ref 26.0–34.0)
MCHC: 34.3 g/dL (ref 30.0–36.0)
MCV: 82.2 fL (ref 80.0–100.0)
Platelets: 229 10*3/uL (ref 150–400)
RBC: 3.09 MIL/uL — ABNORMAL LOW (ref 3.87–5.11)
RDW: 13.8 % (ref 11.5–15.5)
WBC: 7.1 10*3/uL (ref 4.0–10.5)
nRBC: 0 % (ref 0.0–0.2)

## 2018-12-18 LAB — GLUCOSE, CAPILLARY
Glucose-Capillary: 131 mg/dL — ABNORMAL HIGH (ref 70–99)
Glucose-Capillary: 208 mg/dL — ABNORMAL HIGH (ref 70–99)
Glucose-Capillary: 212 mg/dL — ABNORMAL HIGH (ref 70–99)
Glucose-Capillary: 168 mg/dL — ABNORMAL HIGH (ref 70–99)

## 2018-12-18 LAB — BASIC METABOLIC PANEL
Anion gap: 7 (ref 5–15)
BUN: 9 mg/dL (ref 8–23)
CO2: 23 mmol/L (ref 22–32)
Calcium: 8.7 mg/dL — ABNORMAL LOW (ref 8.9–10.3)
Chloride: 112 mmol/L — ABNORMAL HIGH (ref 98–111)
Creatinine, Ser: 0.87 mg/dL (ref 0.44–1.00)
GFR calc Af Amer: >60 mL/min (ref >60)
GFR calc non Af Amer: >60 mL/min (ref >60)
Glucose, Bld: 126 mg/dL — ABNORMAL HIGH (ref 70–99)
Potassium: 3.3 mmol/L — ABNORMAL LOW (ref 3.5–5.1)
Sodium: 142 mmol/L (ref 135–145)

## 2018-12-18 LAB — HEPARIN LEVEL (UNFRACTIONATED): Heparin Unfractionated: 0.35 IU/mL (ref 0.30–0.70)

## 2018-12-18 SURGERY — LOWER EXTREMITY ANGIOGRAPHY
Anesthesia: Moderate Sedation | Laterality: Right

## 2018-12-18 MED ORDER — HYDROCODONE-ACETAMINOPHEN 5-325 MG PO TABS
ORAL_TABLET | ORAL | Status: AC
  Filled 2018-12-18: qty 2

## 2018-12-18 MED ORDER — HEPARIN SODIUM (PORCINE) 1000 UNIT/ML IJ SOLN
INTRAMUSCULAR | Status: AC
  Filled 2018-12-18: qty 1

## 2018-12-18 MED ORDER — MIDAZOLAM HCL 2 MG/ML PO SYRP: 8.0000 mg | ORAL_SOLUTION | Freq: Once | ORAL | Status: DC

## 2018-12-18 MED ORDER — NITROGLYCERIN 1 MG/10 ML FOR IR/CATH LAB
INTRA_ARTERIAL | Status: DC | PRN
  Administered 2018-12-18: 250 ug

## 2018-12-18 MED ORDER — MIDAZOLAM HCL 2 MG/2ML IJ SOLN
INTRAMUSCULAR | Status: DC | PRN
  Administered 2018-12-18: 1 mg via INTRAVENOUS
  Administered 2018-12-18: 2 mg via INTRAVENOUS

## 2018-12-18 MED ORDER — FAMOTIDINE 20 MG PO TABS: 40.0000 mg | ORAL_TABLET | Freq: Once | ORAL | Status: DC | PRN

## 2018-12-18 MED ORDER — TIROFIBAN (AGGRASTAT) BOLUS VIA INFUSION
25.0000 ug/kg | Freq: Once | INTRAVENOUS | Status: AC
  Administered 2018-12-18: 1687.5 ug via INTRAVENOUS
  Filled 2018-12-18: qty 34

## 2018-12-18 MED ORDER — SODIUM CHLORIDE 0.9 % IV SOLN
INTRAVENOUS | Status: DC
  Administered 2018-12-18: 14:00:00 via INTRAVENOUS

## 2018-12-18 MED ORDER — CLINDAMYCIN PHOSPHATE 300 MG/50ML IV SOLN
300.0000 mg | Freq: Once | INTRAVENOUS | Status: AC
  Administered 2018-12-18 (×2): 300 mg via INTRAVENOUS
  Filled 2018-12-18: qty 50

## 2018-12-18 MED ORDER — MIDAZOLAM HCL 5 MG/5ML IJ SOLN
INTRAMUSCULAR | Status: AC
  Filled 2018-12-18: qty 10

## 2018-12-18 MED ORDER — FENTANYL CITRATE (PF) 100 MCG/2ML IJ SOLN
INTRAMUSCULAR | Status: AC
  Filled 2018-12-18: qty 4

## 2018-12-18 MED ORDER — DIPHENHYDRAMINE HCL 50 MG/ML IJ SOLN: 50.0000 mg | Freq: Once | INTRAMUSCULAR | Status: DC | PRN

## 2018-12-18 MED ORDER — MIDAZOLAM HCL 5 MG/5ML IJ SOLN
INTRAMUSCULAR | Status: AC
  Filled 2018-12-18: qty 5

## 2018-12-18 MED ORDER — HEPARIN (PORCINE) 25000 UT/250ML-% IV SOLN: 600.0000 [IU]/h | INTRAVENOUS | Status: DC

## 2018-12-18 MED ORDER — DIPHENHYDRAMINE HCL 50 MG/ML IJ SOLN
INTRAMUSCULAR | Status: AC
  Filled 2018-12-18: qty 1

## 2018-12-18 MED ORDER — FENTANYL CITRATE (PF) 100 MCG/2ML IJ SOLN
INTRAMUSCULAR | Status: DC | PRN
  Administered 2018-12-18: 25 ug via INTRAVENOUS
  Administered 2018-12-18: 50 ug via INTRAVENOUS
  Administered 2018-12-18 (×2): 25 ug via INTRAVENOUS

## 2018-12-18 MED ORDER — SODIUM CHLORIDE 0.9 % IV SOLN: INTRAVENOUS | Status: DC

## 2018-12-18 MED ORDER — HYDROMORPHONE HCL 1 MG/ML IJ SOLN
1.0000 mg | Freq: Once | INTRAMUSCULAR | Status: AC | PRN
  Administered 2018-12-18: 1 mg via INTRAVENOUS

## 2018-12-18 MED ORDER — OXYCODONE-ACETAMINOPHEN 5-325 MG PO TABS
1.0000 | ORAL_TABLET | ORAL | Status: DC | PRN
  Administered 2018-12-18 – 2018-12-19 (×3): 1 via ORAL
  Filled 2018-12-18 (×3): qty 1

## 2018-12-18 MED ORDER — SODIUM CHLORIDE (PF) 0.9 % IJ SOLN
INTRAMUSCULAR | Status: AC
  Filled 2018-12-18: qty 20

## 2018-12-18 MED ORDER — SODIUM CHLORIDE 0.9 % IV SOLN
1.0000 mg/h | INTRAVENOUS | Status: DC
  Administered 2018-12-18: 1 mg/h
  Filled 2018-12-18 (×5): qty 10

## 2018-12-18 MED ORDER — CLINDAMYCIN PHOSPHATE 300 MG/50ML IV SOLN
300.0000 mg | Freq: Once | INTRAVENOUS | Status: AC
  Administered 2018-12-18: 17:00:00 300 mg via INTRAVENOUS

## 2018-12-18 MED ORDER — HYDROMORPHONE HCL 1 MG/ML IJ SOLN
INTRAMUSCULAR | Status: AC
  Filled 2018-12-18: qty 1

## 2018-12-18 MED ORDER — NITROGLYCERIN 5 MG/ML IV SOLN
INTRAVENOUS | Status: AC
  Filled 2018-12-18: qty 10

## 2018-12-18 MED ORDER — ALTEPLASE 2 MG IJ SOLR
INTRAMUSCULAR | Status: AC
  Filled 2018-12-18: qty 8

## 2018-12-18 MED ORDER — SODIUM CHLORIDE FLUSH 0.9 % IV SOLN
INTRAVENOUS | Status: AC
  Filled 2018-12-18: qty 10

## 2018-12-18 MED ORDER — FENTANYL CITRATE (PF) 100 MCG/2ML IJ SOLN
INTRAMUSCULAR | Status: DC | PRN
  Administered 2018-12-18 (×2): 50 ug via INTRAVENOUS

## 2018-12-18 MED ORDER — HEPARIN (PORCINE) 25000 UT/250ML-% IV SOLN
INTRAVENOUS | Status: AC
  Filled 2018-12-18: qty 250

## 2018-12-18 MED ORDER — HYDROMORPHONE HCL 1 MG/ML IJ SOLN
INTRAMUSCULAR | Status: AC
  Filled 2018-12-18: qty 0.5

## 2018-12-18 MED ORDER — HYDROCODONE-ACETAMINOPHEN 5-325 MG PO TABS
1.0000 | ORAL_TABLET | Freq: Four times a day (QID) | ORAL | Status: DC | PRN
  Administered 2018-12-18 – 2018-12-19 (×2): 1 via ORAL
  Filled 2018-12-18: qty 1

## 2018-12-18 MED ORDER — MIDAZOLAM HCL 2 MG/2ML IJ SOLN
INTRAMUSCULAR | Status: DC | PRN
  Administered 2018-12-18: 2 mg via INTRAVENOUS
  Administered 2018-12-18 (×3): 1 mg via INTRAVENOUS

## 2018-12-18 MED ORDER — METHYLPREDNISOLONE SODIUM SUCC 125 MG IJ SOLR: 125.0000 mg | Freq: Once | INTRAMUSCULAR | Status: DC | PRN

## 2018-12-18 MED ORDER — TIROFIBAN HCL IN NACL 5-0.9 MG/100ML-% IV SOLN
0.1500 ug/kg/min | INTRAVENOUS | Status: DC
  Filled 2018-12-18 (×3): qty 100

## 2018-12-18 MED ORDER — IODIXANOL 320 MG/ML IV SOLN
INTRAVENOUS | Status: DC | PRN
  Administered 2018-12-18: 48 mL via INTRAVENOUS

## 2018-12-18 MED ORDER — SODIUM CHLORIDE FLUSH 0.9 % IV SOLN
INTRAVENOUS | Status: AC
  Filled 2018-12-18: qty 20

## 2018-12-18 MED ORDER — HEPARIN SODIUM (PORCINE) 1000 UNIT/ML IJ SOLN
INTRAMUSCULAR | Status: DC | PRN
  Administered 2018-12-18: 3000 [IU] via INTRAVENOUS

## 2018-12-18 MED ORDER — HYDROMORPHONE HCL 1 MG/ML IJ SOLN
0.5000 mg | Freq: Once | INTRAMUSCULAR | Status: AC
  Administered 2018-12-18: 0.5 mg via INTRAVENOUS

## 2018-12-18 MED ORDER — MORPHINE SULFATE (PF) 2 MG/ML IV SOLN
INTRAVENOUS | Status: AC
  Filled 2018-12-18: qty 2

## 2018-12-18 MED ORDER — ONDANSETRON HCL 4 MG/2ML IJ SOLN: 4.0000 mg | Freq: Four times a day (QID) | INTRAMUSCULAR | Status: DC | PRN

## 2018-12-18 MED ORDER — ALTEPLASE 2 MG IJ SOLR
INTRAMUSCULAR | Status: DC | PRN
  Administered 2018-12-18: 8 mg

## 2018-12-18 MED ORDER — MIDAZOLAM HCL 2 MG/ML PO SYRP
ORAL_SOLUTION | ORAL | Status: AC
  Administered 2018-12-18: 8 mg
  Filled 2018-12-18: qty 4

## 2018-12-18 MED ORDER — CLINDAMYCIN PHOSPHATE 300 MG/50ML IV SOLN
INTRAVENOUS | Status: AC
  Administered 2018-12-18: 300 mg via INTRAVENOUS
  Filled 2018-12-18: qty 50

## 2018-12-18 MED ORDER — MIDAZOLAM HCL 2 MG/ML PO SYRP
8.0000 mg | ORAL_SOLUTION | Freq: Once | ORAL | Status: DC | PRN
  Filled 2018-12-18: qty 4

## 2018-12-18 MED ORDER — HEPARIN SODIUM (PORCINE) 1000 UNIT/ML IJ SOLN
INTRAMUSCULAR | Status: DC | PRN
  Administered 2018-12-18: 5000 [IU] via INTRAVENOUS

## 2018-12-18 SURGICAL SUPPLY — 22 items
BALLN LUTONIX 018 4X220X130 (BALLOONS) ×2
BALLN LUTONIX 018 5X300X130 (BALLOONS) ×2
BALLN ULTRVRSE 3X220X150 (BALLOONS) ×2
BALLOON LUTONIX 018 4X220X130 (BALLOONS) IMPLANT
BALLOON LUTONIX 018 5X300X130 (BALLOONS) IMPLANT
BALLOON ULTRVRSE 3X220X150 (BALLOONS) IMPLANT
CANISTER PENUMBRA ENGINE (MISCELLANEOUS) ×1 IMPLANT
CATH ANGIO 5F PIGTAIL 65CM (CATHETERS) ×1 IMPLANT
CATH BEACON 5 .035 65 KMP TIP (CATHETERS) ×1 IMPLANT
CATH BEACON 5 .038 100 VERT TP (CATHETERS) ×1 IMPLANT
CATH INDIGO CAT6 KIT (CATHETERS) ×2 IMPLANT
CATH INFUS 135CMX50CM (CATHETERS) ×1 IMPLANT
DEVICE PRESTO INFLATION (MISCELLANEOUS) ×1 IMPLANT
GLIDEWIRE ADV .035X260CM (WIRE) ×1 IMPLANT
PACK ANGIOGRAPHY (CUSTOM PROCEDURE TRAY) ×2 IMPLANT
SHEATH BRITE TIP 5FRX11 (SHEATH) ×1 IMPLANT
SHEATH PINNACLE ST 6F 45CM (SHEATH) ×1 IMPLANT
SUT SILK 0 FSL (SUTURE) ×1 IMPLANT
SYR MEDRAD MARK 7 150ML (SYRINGE) ×1 IMPLANT
TUBING CONTRAST HIGH PRESS 72 (TUBING) ×1 IMPLANT
WIRE G V18X300CM (WIRE) ×1 IMPLANT
WIRE J 3MM .035X145CM (WIRE) ×1 IMPLANT

## 2018-12-18 SURGICAL SUPPLY — 15 items
BALLN LUTONIX 018 4X100X130 (BALLOONS) ×2
BALLN LUTONIX 018 5X60X130 (BALLOONS) ×2
BALLN ULTRVRSE 2.5X300X150 (BALLOONS) ×2
BALLOON LUTONIX 018 4X100X130 (BALLOONS) IMPLANT
BALLOON LUTONIX 018 5X60X130 (BALLOONS) IMPLANT
BALLOON ULTRVRSE 2.5X300X150 (BALLOONS) IMPLANT
CANISTER PENUMBRA ENGINE (MISCELLANEOUS) ×1 IMPLANT
CATH INDIGO CAT6 KIT (CATHETERS) ×1 IMPLANT
DEVICE PRESTO INFLATION (MISCELLANEOUS) ×1 IMPLANT
DEVICE SAFEGUARD 24CM (GAUZE/BANDAGES/DRESSINGS) ×1 IMPLANT
DEVICE STARCLOSE SE CLOSURE (Vascular Products) ×1 IMPLANT
PACK ANGIOGRAPHY (CUSTOM PROCEDURE TRAY) ×2 IMPLANT
STENT VIABAHN 5X100X120 (Permanent Stent) ×1 IMPLANT
WIRE G V18X300CM (WIRE) ×1 IMPLANT
WIRE J 3MM .035X145CM (WIRE) ×1 IMPLANT

## 2018-12-18 NOTE — Progress Notes (Signed)
Pt transported to special procedures dept for angiogram of the right leg.

## 2018-12-18 NOTE — Consult Note (Signed)
ANTICOAGULATION CONSULT NOTE  Pharmacy Consult for Heparin Indication: VTE treatment  Patient Measurements: Height: 5\' 7"  (170.2 cm) Weight: 148 lb 11.2 oz (67.4 kg) IBW/kg (Calculated) : 61.6 Heparin Dosing Weight: 64.8 kg  Vital Signs: Temp: 98.9 F (37.2 C) (08/31 0414) Temp Source: Oral (08/31 0414) BP: 160/66 (08/31 0414) Pulse Rate: 78 (08/31 0414)  Labs: Recent Labs    12/15/18 1354  12/16/18 0511  12/17/18 0527 12/17/18 1308 12/17/18 1915 12/18/18 0432  HGB  --    < > 9.1*  --  9.0*  --   --  8.7*  HCT  --   --  27.2*  --  26.7*  --   --  25.4*  PLT  --   --  231  --  240  --   --  229  APTT 116*  --   --   --   --   --   --   --   LABPROT 13.8  --   --   --   --   --   --   --   INR 1.1  --   --   --   --   --   --   --   HEPARINUNFRC  --    < > 0.37   < > 0.27* 0.33 0.34 0.35  CREATININE 0.80  --  0.84  --   --   --   --  0.87   < > = values in this interval not displayed.    Estimated Creatinine Clearance: 64.4 mL/min (by C-G formula based on SCr of 0.87 mg/dL).   Medical History: Past Medical History:  Diagnosis Date  . Anxiety    h/o  . COPD (chronic obstructive pulmonary disease) ([place])   . Diabetes mellitus without complication ([place])   . GERD (gastroesophageal reflux disease)   . Hypertension    bp under control-off meds since 2019    Medications:  Medications Prior to Admission  Medication Sig Dispense Refill Last Dose  . albuterol (PROVENTIL HFA;VENTOLIN HFA) 108 (90 Base) MCG/ACT inhaler Inhale 2 puffs into the lungs every 6 (six) hours as needed for wheezing or shortness of breath. 1 Inhaler 2 prn at prn  . alum & mag hydroxide-simeth (MAALOX/MYLANTA) 200-200-20 MG/5ML suspension Take 30 mLs by mouth every 4 (four) hours as needed for indigestion or heartburn. 355 mL 0 prn at prn  . atorvastatin (LIPITOR) 10 MG tablet Take 1 tablet (10 mg total) by mouth daily. 90 tablet 3 12/13/2018 at Unknown time  . Biotin 1000 MCG tablet Take 1,000  tablets by mouth daily.    12/13/2018 at Unknown time  . cetirizine (ZYRTEC) 10 MG tablet Take 10 mg by mouth daily.   12/13/2018 at Unknown time  . cholecalciferol (VITAMIN D3) 25 MCG (1000 UT) tablet Take 1,000 Units by mouth daily.   12/13/2018 at Unknown time  . iron polysaccharides (NIFEREX) 150 MG capsule Take 1 capsule (150 mg total) by mouth daily. 30 capsule 3 12/13/2018 at Unknown time  . Multiple Vitamins-Minerals (WOMENS MULTIVITAMIN PO) Take 1 tablet by mouth daily.   12/13/2018 at Unknown time  . pantoprazole (PROTONIX) 40 MG tablet Take 1 tablet (40 mg total) by mouth daily. 30 tablet 0 12/13/2018 at Unknown time  . sitaGLIPtin (JANUVIA) 100 MG tablet Take 100 mg by mouth daily.   12/13/2018 at Unknown time  . vitamin C (ASCORBIC ACID) 500 MG tablet Take 500 mg by mouth daily.  12/13/2018 at Unknown time  . warfarin (COUMADIN) 5 MG tablet Take 1 tablet (5 mg total) by mouth daily at 6 PM. 30 tablet 5 12/13/2018 at Unknown time   Scheduled:  . atorvastatin  10 mg Oral Daily  . docusate sodium  100 mg Oral BID  . insulin aspart  0-5 Units Subcutaneous QHS  . insulin aspart  0-9 Units Subcutaneous TID WC  . linagliptin  5 mg Oral Daily  . metoCLOPramide  5 mg Oral TID AC  . sodium chloride flush  3 mL Intravenous Q12H   Infusions:  . sodium chloride 75 mL/hr at 12/17/18 1826  . sodium chloride 75 mL/hr at 12/18/18 0400  . sodium chloride    . ciprofloxacin Stopped (12/17/18 2024)  . heparin 900 Units/hr (12/18/18 0400)  . metronidazole Stopped (12/18/18 0350)   PRN: acetaminophen, morphine injection, ondansetron (ZOFRAN) IV Anti-infectives (From admission, onward)   Start     Dose/Rate Route Frequency Ordered Stop   12/18/18 0100  clindamycin (CLEOCIN) IVPB 300 mg     300 mg 100 mL/hr over 30 Minutes Intravenous  Once 12/18/18 0058 12/18/18 0212   12/15/18 2000  ciprofloxacin (CIPRO) IVPB 400 mg     400 mg 200 mL/hr over 60 Minutes Intravenous Every 12 hours 12/15/18 1811      12/15/18 1800  metroNIDAZOLE (FLAGYL) IVPB 500 mg     500 mg 100 mL/hr over 60 Minutes Intravenous Every 8 hours 12/15/18 1751        Assessment: History of DVT.  The patient was on Coumadin, off Coumadin for 2 weeks due to recent GI bleeding. Vascular suggested to start heparin and holding coumadin for now. Pt took warfarin 5 mg daily PTA.   Goal of Therapy:  Heparin level 0.3-0.7 units/ml Monitor platelets by anticoagulation protocol: Yes   Heparin Course: 8/28 initiation 3800 units bolus, then 950 units/hr 8/28 1926 HL 0.94: dec to 800 units/hr 08/29 @ 0500 HL 0.37 therapeutic. Will continue current rate of 800 units/hr 08/30 @ 0500 HL 0.27 subtherapeutic. Will increase rate to 900 units/hr  08/30 @ 1308 HL 0.33 therapeutic  Plan:  08/31 HL 0.35 therapeutic. Will continue rate of 900 units/hr and will recheck HL with AM labs.   Thomasene Ripple, PharmD 12/18/2018,5:25 AM

## 2018-12-18 NOTE — Op Note (Signed)
[facility]  Percutaneous Study/Intervention Procedural Note   Date of Surgery: 12/18/2018  Surgeon(s):Zed Wanninger    Assistants:none  Pre-operative Diagnosis: PAD with rest [name] both lower extremities  Post-operative diagnosis:  Same  Procedure(s) Performed:             1.  Ultrasound guidance for vascular access left femoral artery             2.  Catheter placement into right common femoral artery from left femoral approach             3.  Aortogram and selective right lower extremity angiogram             4.   Mechanical thrombectomy with penumbra CAT 6 device to the right SFA, popliteal artery, tibioperoneal trunk, and proximal posterior tibial arteries             5.   Percutaneous transluminal angioplasty of the right tibioperoneal trunk and posterior tibial artery with 3 mm diameter by 22 cm length angioplasty balloon  6.  Percutaneous transluminal angioplasty of the right popliteal artery with 4 mm diameter by 22 cm length [name]             7.   Percutaneous transluminal angioplasty of the right SFA with 5 mm diameter by 30 cm length [name]  8.   Placement of 8 mg of TPA to the right SFA, popliteal artery, and tibioperoneal trunk as well as placement of an infusion catheter for continuous thrombolytic therapy following the procedure using a 130 cm total length 50 cm working length lysis catheter  EBL: 100 cc  Contrast: 70 cc  Fluoro Time: 8.6 minutes  Moderate Conscious Sedation Time: approximately 30 minutes using 5 mg of Versed and 125 mcg of Fentanyl              Indications:  Patient is a 63 y.o.female with severe peripheral arterial disease with recurrent rest [name] type symptoms to both legs.  She is undergone previous intervention earlier this year but stopped taking her blood thinner. The patient is brought in for angiography for further evaluation and potential treatment.  Due to  the limb threatening nature of the situation, angiogram was performed for attempted limb salvage. The patient is aware that if the procedure fails, amputation would be expected.  The patient also understands that even with successful revascularization, amputation may still be required due to the severity of the situation.  Risks and benefits are discussed and informed consent is obtained.   Procedure:  The patient was identified and appropriate procedural time out was performed.  The patient was then placed supine on the table and prepped and draped in the usual sterile fashion. Moderate conscious sedation was administered during a face to face encounter with the patient throughout the procedure with my supervision of the RN administering medicines and monitoring the patient's vital signs, pulse oximetry, telemetry and mental status throughout from the start of the procedure until the patient was taken to the recovery room. Ultrasound was used to evaluate the left common femoral artery.  It was patent .  A digital ultrasound image was acquired.  A Seldinger needle was used to access the left common femoral artery under direct ultrasound guidance and a permanent image was performed.  A 0.035 J wire was advanced without resistance and a 5Fr sheath was placed.  Pigtail catheter was placed into the aorta and an AP aortogram was  performed. 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  This has seemed to progress from her previous imaging although there was disease there previously.  Renal arteries appear to be patent.  The iliac arteries were fairly small but patent including the previously placed left iliac stent.  This aortic lesion could certainly be contributing to her recurrent occlusive disease in the infrainguinal segment given the inflow limitation as well as the potential for embolization.  I then crossed the aortic bifurcation and advanced to the right femoral head.  Selective right lower extremity angiogram was then performed. This demonstrated a near flush occlusion of the right SFA a couple of centimeters above the previously placed stents with thrombosis and occlusion of the entire SFA and popliteal stents.  There was reconstitution of the peroneal artery and posterior tibial arteries after occlusion of the popliteal artery and tibioperoneal trunk.  There also appeared to be some reconstitution of the anterior tibial artery in the midsegment. It was felt that it was in the patient's best interest to proceed with intervention after these images to avoid a second procedure and a larger amount of contrast and fluoroscopy based off of the findings from the initial angiogram. The patient was systemically heparinized and a 6 [place] destination sheath was then placed over the Terumo Advantage wire. I then used a Kumpe catheter and the advantage wire to navigate through the SFA and popliteal lesions.  On the navigated through the tibioperoneal trunk occlusion in the proximal posterior tibial artery occlusion and got down into the posterior tibial artery confirming intraluminal flow with a Kumpe catheter.  I then placed a V 18 wire and instilled 8 mg of TPA throughout the SFA, popliteal artery, and tibioperoneal trunk.  After this dwelled, the penumbra CAT 6 device was brought on the field and mechanical thrombectomy was performed with 3 passes with a penumbra CAT 6 device.  This encompassed the entire SFA, popliteal artery, tibioperoneal trunk, and the proximal portions of the posterior tibial arteries.  Several large chunks of both acute and more chronic appearing thrombus were removed, but there remained minimal flow.  I then tried angioplasty to see if that could improve the flow.  The tibioperoneal trunk and proximal portion of the posterior tibial artery down to the proximal to mid segment were treated with a 3 mm diameter by 22 cm length angioplasty balloon inflated to 10 atm  for 1 minute.  The popliteal artery and proximal portion of the tibioperoneal trunk up to the distal SFA were then treated with a 4 mm diameter by 22 cm length [name] inflated to 12 atm for 1 minute.  The SFA from its origin down to Hunter's canal was then treated with a 5 mm diameter by 30 cm length [name] inflated to 8 atm for 1 minute.  Following thrombectomy and then the angioplasty imaging was performed.  Unfortunately, there remained essentially no flow with continued occlusion of the SFA, popliteal artery, and tibial vessels.  I felt our best option at successful revascularization would be a continuous infusion of thrombolytic therapy and so a thrombolysis catheter was placed going from the origin of the SFA down throughout the entire SFA and popliteal artery and into the tibioperoneal trunk.  The sheath was secured in place with a silk suture.  A continuous infusion of TPA was then started through the sheath and the patient was taken to the recovery room with a plan for  a second look angiogram later today. The patient was taken to the recovery room in stable condition having tolerated the procedure well.  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  This has seemed to progress from her previous imaging although there was disease there previously.  Renal arteries appear to be patent.  The iliac arteries were fairly small but patent including the previously placed left iliac stent.             Right lower Extremity:  This demonstrated a near flush occlusion of the right SFA a couple of centimeters above the previously placed stents with thrombosis and occlusion of the entire SFA and popliteal stents.  There was reconstitution of the peroneal artery and posterior tibial arteries after occlusion of the popliteal artery and tibioperoneal trunk.  There also appeared to  be some reconstitution of the anterior tibial artery in the midsegment   Disposition: Patient was taken to the recovery room in stable condition having tolerated the procedure well.  Complications: None  Sylvia [name] 12/18/2018 10:05 AM   This note was created with Dragon Medical transcription system. Any errors in dictation are purely unintentional.

## 2018-12-18 NOTE — H&P (Signed)
[facility] History & Physical Update  The patient was interviewed and re-examined.  The patient's previous History and Physical has been reviewed and is unchanged.  There is no change in the plan of care. We plan to proceed with the scheduled procedure.  Leotis Pain, MD  12/18/2018, 8:25 AM

## 2018-12-18 NOTE — Progress Notes (Signed)
Pt. C/o severe lower leg (right) pain, "10" out of 10 . Absent pulses to RLE still. Pt. Med. Now witih Norco 1 tab p.o.

## 2018-12-18 NOTE — Progress Notes (Signed)
[facility] - [place] [facility]   PATIENT NAME: Sylvia [name]    MR#:  [id number]  DATE OF BIRTH:  [birth year]  , Has severe pain.  In both legs status post angiogram, patient still has no pulse in the right leg.  CHIEF COMPLAINT:   Chief Complaint  Patient presents with  . Abdominal Pain     REVIEW OF SYSTEMS:  CONSTITUTIONAL: No fever, fatigue or weakness.  EYES: No blurred or double vision.  EARS, NOSE, AND THROAT: No tinnitus or ear pain.  RESPIRATORY: No cough, shortness of breath, wheezing or hemoptysis.  CARDIOVASCULAR: No chest pain, orthopnea, edema.  GASTROINTESTINAL: Patient has nausea but no vomiting, diarrhea.  GENITOURINARY: No dysuria, hematuria.  ENDOCRINE: No polyuria, nocturia,  HEMATOLOGY: No anemia, easy bruising or bleeding SKIN: No rash or lesion. MUSCULOSKELETAL: No joint pain or arthritis.  Severe bilateral leg pain. NEUROLOGIC: No tingling, numbness, weakness.  PSYCHIATRY: No anxiety or depression.     DRUG ALLERGIES:   Allergies  Allergen Reactions  . Penicillins Hives    Has patient had a PCN reaction causing immediate rash, facial/tongue/throat swelling, SOB or lightheadedness with hypotension: Yes Has patient had a PCN reaction causing severe rash involving mucus membranes or skin necrosis: No Has patient had a PCN reaction that required hospitalization: No Has patient had a PCN reaction occurring within the last 10 years: No If all of the above answers are "NO", then may proceed with Cephalosporin use.  . Tramadol Itching    VITALS:  Blood pressure (!) 206/85, pulse 77, temperature 98 F (36.7 C), temperature source Oral, resp. rate 13, height 5\' 7"  (1.702 m), weight 67.4 kg, SpO2 99 %.  PHYSICAL EXAMINATION:  GENERAL:  63 y.o.-year-old patient lying in the bed with no acute distress.  EYES: Pupils equal, round, reactive to light and accommodation. No scleral icterus. Extraocular muscles intact.  HEENT: Head  atraumatic, normocephalic. Oropharynx and nasopharynx clear.  NECK:  Supple, no jugular venous distention. No thyroid enlargement, no tenderness.  LUNGS: Normal breath sounds bilaterally, no wheezing, rales,rhonchi or crepitation. No use of accessory muscles of respiration.  CARDIOVASCULAR: S1, S2 normal. No murmurs, rubs, or gallops.  ABDOMEN: Soft, nontender, nondistended. Bowel sounds present. No organomegaly or mass.  EXTREMITIES: No pedal edema, cyanosis, or clubbing.  NEUROLOGIC: Cranial nerves II through XII are intact. Muscle strength 4/5 in all extremities. Sensation intact. Gait not checked.  PSYCHIATRIC: The patient is alert and oriented x 3.  SKIN: No obvious rash, lesion, or ulcer.   Physical Exam LABORATORY PANEL:   CBC Recent Labs  Lab 12/18/18 0432  WBC 7.1  HGB 8.7*  HCT 25.4*  PLT 229   ------------------------------------------------------------------------------------------------------------------  Chemistries  Recent Labs  Lab 12/14/18 1240  12/18/18 0432  NA 141   < > 142  K 3.8   < > 3.3*  CL 104   < > 112*  CO2 20*   < > 23  GLUCOSE 291*   < > 126*  BUN 14   < > 9  CREATININE 1.04*   < > 0.87  CALCIUM 10.8*   < > 8.7*  AST 17  --   --   ALT 15  --   --   ALKPHOS 86  --   --   BILITOT 1.2  --   --    < > = values in this interval not displayed.   ------------------------------------------------------------------------------------------------------------------  Cardiac Enzymes No results for input(s): TROPONINI in the last 168  hours. ------------------------------------------------------------------------------------------------------------------  RADIOLOGY:  No results found.  ASSESSMENT AND PLAN:   Active Problems:   DKA (diabetic ketoacidoses) ([place])  DKA with history of diabetes 2. resolved with IV hydration,   acute gastroenteritis. resolved.  Started on IV antibiotics secondary to enteritis and leukocytosis and possible concern  for infection, CT abdomen showed enteritis. Patient received 3 days of IV antibiotics discontinue IV antibiotics as patient has no evidence of abdominal pain or leukocytosis.  Peripheral artery disease with rest pain in both legs status post angiogram of the right leg, status post mechanical thrombectomy of right superficial femoral and popliteal, angioplasty of right tibial peroneal trunk and stent placement in the right SFA, patient also received TPA in the right SFA.  Continue pain management with IV morphine, p.o. Percocet round-the-clock today and see how she does.  History of DVT.  The patient was on Coumadin, off Coumadin for 2 weeks due to recent GI bleeding.  Continue heparin drip.  Hypertension.  Continue home hypertension medication after resuming diet. COPD.  Stable.  Status post angiogram, has severe leg pain issues, continue IV pain medicines today, IV heparin drip, follow-up with after surgery for further recommendations,   All the records are reviewed and case discussed with Care Management/Social Workerr. Management plans discussed with the patient, family and they are in agreement.  CODE STATUS: Full  TOTAL TIME TAKING CARE OF THIS PATIENT: 35 minutes.  More than 50% time spent in counseling, coordination of care   POSSIBLE D/C IN 1-2 DAYS, DEPENDING ON CLINICAL CONDITION.   Epifanio Lesches M.D on 12/18/2018   Between 7am to 6pm - Pager - [phone]  After 6pm go to [email or web address] - password [email or web address] [facility]  Office  [phone]  CC: Primary care physician; [facility], [facility]  Note: This dictation was prepared with Diplomatic Services operational officer dictation along with smaller phrase technology. Any transcriptional errors that result from this process are unintentional.

## 2018-12-18 NOTE — Op Note (Signed)
[facility]  Percutaneous Study/Intervention Procedural Note   Date of Surgery: 12/18/2018  Surgeon(s):Jojo Pehl    Assistants:none  Pre-operative Diagnosis: PAD with rest [name] BLE  Post-operative diagnosis:  Same  Procedure(s) Performed:             1.  RLE angiogram             2.  Catheter placement into right peroneal artery from left femoral approach             3.   Mechanical thrombectomy to the right SFA, popliteal artery, tibioperoneal trunk, and peroneal artery             4.  Percutaneous transluminal angioplasty of the right peroneal artery and tibioperoneal trunk with 2.5 mm diameter angioplasty balloon             5.   Percutaneous transluminal angioplasty of the right popliteal artery with 4 mm diameter [name]  6.  Percutaneous transluminal angioplasty of the right proximal SFA with 5 mm diameter [name]  7.  Viabahn stent placement to the right popliteal artery with 5 mm diameter by 10 cm length stent             8.  StarClose closure device left femoral artery  EBL: 200 cc  Contrast: 48 cc  Fluoro Time: 6.8 minutes  Moderate Conscious Sedation Time: approximately 45 minutes using 3 mg of Versed and 100 mcg of Fentanyl              Indications:  Patient is a 63 y.o.female with rest [name] who has had thrombolytic catheter placed in running all day. The patient is brought in for angiography for further evaluation and potential treatment.  Due to the limb threatening nature of the situation, angiogram was performed for attempted limb salvage. The patient is aware that if the procedure fails, amputation would be expected.  The patient also understands that even with successful revascularization, amputation may still be required due to the severity of the situation.  Risks and benefits are discussed and informed consent is obtained.   Procedure:  The patient was identified and  appropriate procedural time out was performed.  The patient was then placed supine on the table and prepped and draped in the usual sterile fashion. Moderate conscious sedation was administered during a face to face encounter with the patient throughout the procedure with my supervision of the RN administering medicines and monitoring the patient's vital signs, pulse oximetry, telemetry and mental status throughout from the start of the procedure until the patient was taken to the recovery room.  The existing catheter was removed and a V 18 wire was placed into the peroneal artery. Selective right lower extremity angiogram was then performed. This demonstrated continued occlusion of the right SFA with minimal forward flow.  The cat 6 device was brought onto the field and mechanical thrombectomy was performed throughout the entire right SFA, popliteal artery, and down into the tibioperoneal trunk and peroneal artery.  Several large chunks of thrombus were removed and at this point there remained a 70 to 80% stenosis at the origin of the SFA, a greater than 80% stenosis in the popliteal artery, and what appeared to be an occlusion of the tibioperoneal trunk and peroneal artery.  3 passes with the penumbra cat 6 device was performed.  A 2.5 mm diameter by 30 cm length angioplasty balloon was inflated from the distal peroneal artery  up to the tibioperoneal trunk and taken to 12 atm for 1 minute.  A 4 mm diameter by 10 cm length [name] was inflated in the right popliteal artery up to 8 atm for 1 minute.  This was just at the bottom of the previously placed stents and down to the tibioperoneal trunk origin.  The proximal SFA was treated with a 5 mm diameter by 6 cm length [name] inflated to 10 atm for 1 minute.  The proximal SFA had about a 20 to 25% residual stenosis.  The tibioperoneal trunk and peroneal artery were quite clean with no significant  residual stenosis and brisk flow distally.  The posterior tibial artery was also now patent and had good flow distally.  The popliteal artery remained highly stenotic greater than 50%.  I placed a 5 mm diameter by 10 cm length via bond stent in this location taking care not to impinge on the tibial vessels and postdilated this with a 4 mm balloon with about a 10% residual stenosis. I elected to terminate the procedure. The sheath was removed and StarClose closure device was deployed in the left femoral artery with excellent hemostatic result. The patient was taken to the recovery room in stable condition having tolerated the procedure well.  Findings:                           Right Lower Extremity:  Continued severe distal disease which was markedly improved with revascularization   Disposition: Patient was taken to the recovery room in stable condition having tolerated the procedure well.  Complications: None  Sylvia [name] 12/18/2018 6:08 PM   This note was created with Dragon Medical transcription system. Any errors in dictation are purely unintentional.

## 2018-12-19 ENCOUNTER — Encounter: Payer: Self-pay | Admitting: Vascular Surgery

## 2018-12-19 LAB — CBC
HCT: 24.9 % — ABNORMAL LOW (ref 36.0–46.0)
Hemoglobin: 8.4 g/dL — ABNORMAL LOW (ref 12.0–15.0)
MCH: 27.7 pg (ref 26.0–34.0)
MCHC: 33.7 g/dL (ref 30.0–36.0)
MCV: 82.2 fL (ref 80.0–100.0)
Platelets: 203 10*3/uL (ref 150–400)
RBC: 3.03 MIL/uL — ABNORMAL LOW (ref 3.87–5.11)
RDW: 14.5 % (ref 11.5–15.5)
WBC: 9.2 10*3/uL (ref 4.0–10.5)
nRBC: 0 % (ref 0.0–0.2)

## 2018-12-19 LAB — HEPARIN LEVEL (UNFRACTIONATED): Heparin Unfractionated: 0.16 IU/mL — ABNORMAL LOW (ref 0.30–0.70)

## 2018-12-19 LAB — PROTIME-INR
INR: 1.1 (ref 0.8–1.2)
Prothrombin Time: 14.2 seconds (ref 11.4–15.2)

## 2018-12-19 LAB — GLUCOSE, CAPILLARY
Glucose-Capillary: 147 mg/dL — ABNORMAL HIGH (ref 70–99)
Glucose-Capillary: 167 mg/dL — ABNORMAL HIGH (ref 70–99)
Glucose-Capillary: 123 mg/dL — ABNORMAL HIGH (ref 70–99)
Glucose-Capillary: 131 mg/dL — ABNORMAL HIGH (ref 70–99)

## 2018-12-19 LAB — APTT: aPTT: 32 seconds (ref 24–36)

## 2018-12-19 MED ORDER — ADULT MULTIVITAMIN W/MINERALS CH
1.0000 | ORAL_TABLET | Freq: Every day | ORAL | Status: DC
  Administered 2018-12-19 – 2019-01-06 (×16): 1 via ORAL
  Filled 2018-12-19 (×16): qty 1

## 2018-12-19 MED ORDER — OXYCODONE HCL 5 MG PO TABS
10.0000 mg | ORAL_TABLET | ORAL | Status: DC | PRN
  Administered 2018-12-19 – 2019-01-06 (×39): 10 mg via ORAL
  Filled 2018-12-19 (×37): qty 2

## 2018-12-19 MED ORDER — HEPARIN (PORCINE) 25000 UT/250ML-% IV SOLN
1200.0000 [IU]/h | INTRAVENOUS | Status: DC
  Administered 2018-12-19: 950 [IU]/h via INTRAVENOUS
  Administered 2018-12-20: 1200 [IU]/h via INTRAVENOUS
  Filled 2018-12-19 (×2): qty 250

## 2018-12-19 MED ORDER — HEPARIN BOLUS VIA INFUSION
2100.0000 [IU] | Freq: Once | INTRAVENOUS | Status: AC
  Administered 2018-12-19: 2100 [IU] via INTRAVENOUS
  Filled 2018-12-19: qty 2100

## 2018-12-19 MED ORDER — POLYSACCHARIDE IRON COMPLEX 150 MG PO CAPS
150.0000 mg | ORAL_CAPSULE | Freq: Every day | ORAL | Status: DC
  Administered 2018-12-19 – 2019-01-06 (×16): 150 mg via ORAL
  Filled 2018-12-19 (×21): qty 1

## 2018-12-19 MED ORDER — PANTOPRAZOLE SODIUM 40 MG PO TBEC
40.0000 mg | DELAYED_RELEASE_TABLET | Freq: Every day | ORAL | Status: DC
  Administered 2018-12-19 – 2018-12-31 (×10): 40 mg via ORAL
  Filled 2018-12-19 (×10): qty 1

## 2018-12-19 MED ORDER — POTASSIUM CHLORIDE CRYS ER 20 MEQ PO TBCR
40.0000 meq | EXTENDED_RELEASE_TABLET | Freq: Once | ORAL | Status: AC
  Administered 2018-12-19: 40 meq via ORAL
  Filled 2018-12-19: qty 2

## 2018-12-19 MED ORDER — KETOROLAC TROMETHAMINE 30 MG/ML IJ SOLN
30.0000 mg | Freq: Four times a day (QID) | INTRAMUSCULAR | Status: AC
  Administered 2018-12-19 – 2018-12-22 (×10): 30 mg via INTRAVENOUS
  Filled 2018-12-19 (×11): qty 1

## 2018-12-19 MED ORDER — WARFARIN - PHARMACIST DOSING INPATIENT
Freq: Every day | Status: DC
  Administered 2018-12-19: 19:00:00

## 2018-12-19 MED ORDER — MORPHINE SULFATE (PF) 2 MG/ML IV SOLN
2.0000 mg | INTRAVENOUS | Status: DC | PRN
  Administered 2018-12-21: 2 mg via INTRAVENOUS
  Filled 2018-12-19: qty 1

## 2018-12-19 MED ORDER — OXYCODONE HCL 5 MG PO TABS
5.0000 mg | ORAL_TABLET | ORAL | Status: DC | PRN
  Administered 2018-12-27 – 2019-01-01 (×2): 5 mg via ORAL
  Filled 2018-12-19 (×4): qty 1

## 2018-12-19 MED ORDER — POLYETHYLENE GLYCOL 3350 17 G PO PACK
17.0000 g | PACK | Freq: Every day | ORAL | Status: DC
  Administered 2018-12-19 – 2019-01-01 (×9): 17 g via ORAL
  Filled 2018-12-19 (×12): qty 1

## 2018-12-19 MED ORDER — WARFARIN SODIUM 2.5 MG PO TABS
7.5000 mg | ORAL_TABLET | Freq: Once | ORAL | Status: AC
  Administered 2018-12-19: 7.5 mg via ORAL
  Filled 2018-12-19: qty 3
  Filled 2018-12-19: qty 1

## 2018-12-19 NOTE — Plan of Care (Signed)
Nutrition Education Note   RD consulted for education regarding vitamin K and medications  63 y.o. female presenting to hospital 12/14/18 with abdominal pain, nausea, vomiting, and diarrhea.  Pt admitted to hospital with DKA, acute gastroenteritis, leukocytosis, and extensive aortoiliac atherosclerosis.  S/p angio 8/31 R LE d/t PAD with resting pain B LE's.  PMH includes htn, DM, PAD, anxiety, COPD, h/o DVT.  RD provided vitamin K and medication education to patient today. Educated pt that the goal is not to avoid foods that contain vitamin K as these foods are often rich in fiber and micronutrients but rather to monitor intake and eat these foods consistently. Provided pt with " Vitamin K and Medication" handout from the [employer]. RD also provided a list of vitamin K containing foods that includes the amount of vitamin K listed for each food item. Explained to patient how vitamin K affects blood thinning medications and how it is difficult to regulate INR if vitamin K intake in inconsistent. Educated pt that multivitamins and supplements also contain vitamin K and must be taken consistently.   Teach back method was used and pt verbalized understanding  Expect fair compliance   No further nutrition interventions are needed. Please re-consult if anything further is needed  Koleen Distance MS, RD, LDN Pager #- [phone] Office#- [phone] After Hours Pager: [phone]

## 2018-12-19 NOTE — Evaluation (Signed)
Occupational Therapy Evaluation Patient Details Name: Sylvia [name] MRN: [id number] DOB: 06/18/1955 Today's Date: 12/19/2018    History of Present Illness Pt is a 63 y.o. female presenting to hospital 12/14/18 with abdominal pain, nausea, vomiting, and diarrhea.  Pt admitted to hospital with DKA, acute gastroenteritis, leukocytosis, and extensive aortoiliac atherosclerosis.  S/p angio 8/31 R LE d/t PAD with resting pain B LE's.  PMH includes htn, DM, PAD, anxiety, COPD, h/o DVT.   Clinical Impression   Pt is 63 year old female who underwent angio on 8/31 for RLE d/t PAD with resting pain in BLEs and plan per patient report to have angio to LLE.  Prior to hospital admission, pt was independent with ADLs.  Pt lives with family in 1 level home with ramp access and bathroom modifications including grab bars by toilet and in shower with shower chair with back.  Patient is cooperative and eager to regain independence.  She has reacher and sock aid at home that her mother used in the past if she needs them after discharge.  She currently requires mod cues and min assist for LB dressing due to leg pain when sitting up and standing.  Limited session to EOB only since her pain increased from 4/10 to 9/10 earlier with PT with fluctuations in HR. Pt would benefit from skilled OT to address noted impairments and functional limitations (see below for any additional details).  Rec OT continue 2x per week for ADL retraining with or without AD for LB dressing skills, functional mobility training and education and training for family. Concur with PT's rec that if pt's ambulation distance continues to be limited d/t pain, may consider manual w/c to improve pt's ability to manage at home more independently. Pt most likely will not need HH OT but will monitor since she is having another procedure to LLE on Thurs per patient report.    Follow Up Recommendations  No OT follow up    Equipment Recommendations        Recommendations for Other Services       Precautions / Restrictions Precautions Precautions: None Precaution Comments: pt is mod fall risk Restrictions Weight Bearing Restrictions: No      Mobility Bed Mobility Overal bed mobility: Needs Assistance Bed Mobility: Supine to Sit     Supine to sit: Supervision;HOB elevated     General bed mobility comments: mild increased effort to perform on own  Transfers Overall transfer level: Needs assistance Equipment used: Rolling walker (2 wheeled) Transfers: Sit to/from [facility] Sit to Stand: Min guard;Min assist Stand pivot transfers: Min guard       General transfer comment: pt requiring vc's to initially square hips sitting edge of bed, scooting forward towards edge of bed, L LE positioning, and B UE positioning in order to stand up to RW; minimal WB'ing noted through R LE upon standing d/t pain and pt preferring to be NWB'ing initially; able to perform stand step turn bed to recliner with RW with vc's for increasing UE support through RW to offweight R LE and also vc's for stepping technique and walker use; vc's for scooting to edge of recliner (and later chair) with vc's for UE/LE placement; stand step turn chair to recliner CGA with minimal vc's for technique    Balance Overall balance assessment: Needs assistance Sitting-balance support: No upper extremity supported;Feet supported Sitting balance-Leahy Scale: Good Sitting balance - Comments: steady sitting reaching within BOS   Standing balance support: Single extremity supported Standing balance-Leahy  Scale: Fair Standing balance comment: steady static standing with single UE support                           ADL either performed or assessed with clinical judgement   ADL Overall ADL's : Needs assistance/impaired Eating/Feeding: Independent;Set up   Grooming: Wash/dry hands;Wash/dry face;Oral care;Set up;Independent   Upper Body Bathing:  Independent;Set up   Lower Body Bathing: Minimal assistance;Set up   Upper Body Dressing : Independent;Set up   Lower Body Dressing: Minimal assistance;Set up;Sit to/from stand Lower Body Dressing Details (indicate cue type and reason): pain increased when standing and only tol briefly before needing to sit down.  Went from 0/10 to 4/10 to 9/10.               General ADL Comments: Pain limited patient's ability to progress further than sitting at EOB to assess LB dressing skills.  She has reacher and sock aid at home but was not aware of how to use reacher for LB dressing.  Her mom needed AD and is reason why there is a ramp and grab bars by toilet and shower with shower chair.  Pt cooperative and eager to regain independence again.  She is supposed to have another surgery on her L left on Thurs per patient report.     Vision Baseline Vision/History: No visual deficits Patient Visual Report: No change from baseline       Perception     Praxis      Pertinent Vitals/Pain Pain Assessment: 0-10 Pain Score: 4  Pain Location: R LE Pain Descriptors / Indicators: Aching;Constant;Discomfort Pain Intervention(s): Limited activity within patient's tolerance;Monitored during session;Premedicated before session;Repositioned     Hand Dominance Right   Extremity/Trunk Assessment Upper Extremity Assessment Upper Extremity Assessment: Overall WFL for tasks assessed   Lower Extremity Assessment Lower Extremity Assessment: Defer to PT evaluation RLE Deficits / Details: hip flexion at least 3/5 AROM; knee flexion/extension at least 3/5 AROM; DF/PF at least 2+/5 (limited AROM d/t "stiff" feeling and pain with movement); DF limited to neutral d/t pain RLE: Unable to fully assess due to pain   Cervical / Trunk Assessment Cervical / Trunk Assessment: Normal   Communication Communication Communication: No difficulties   Cognition Arousal/Alertness: Awake/alert Behavior During Therapy: WFL  for tasks assessed/performed Overall Cognitive Status: Within Functional Limits for tasks assessed                                     General Comments  L groin incision covered by band-aid and no increased drainage noted through band-aid beginning to end of session    Exercises Total Joint Exercises Ankle Circles/Pumps: AROM;Strengthening;Both;10 reps;Supine Quad Sets: AROM;Strengthening;Both;10 reps;Supine Short Arc Quad: AROM;Strengthening;Both;10 reps;Supine Heel Slides: AROM;Left;AAROM;Right;Strengthening;10 reps;Supine   Shoulder Instructions      Home Living Family/patient expects to be discharged to:: Private residence Living Arrangements: Parent;Other relatives Available Help at Discharge: Family Type of Home: House Home Access: Ramped entrance;Stairs to enter Entrance Stairs-Number of Steps: 4 with R railing plus 8 with B railing or can use ramp from entrance in back   Home Layout: One level     Bathroom Shower/Tub: Tub/shower unit;Curtain   FirefighterBathroom Toilet: Standard     Home Equipment: Environmental consultantWalker - 2 wheels;Other (comment);Shower seat          Prior Functioning/Environment Level of Independence: Independent  Comments: Pt reports no falls in past 6 months.        OT Problem List: Pain;Decreased activity tolerance;Impaired balance (sitting and/or standing)      OT Treatment/Interventions: Self-care/ADL training;Patient/family education;DME and/or AE instruction    OT Goals(Current goals can be found in the care plan section) Acute Rehab OT Goals Patient Stated Goal: to go home and take care of myself again OT Goal Formulation: With patient Time For Goal Achievement: 01/02/19 Potential to Achieve Goals: Good ADL Goals Pt Will Perform Lower Body Dressing: with set-up;with supervision;with adaptive equipment;sit to/from stand Pt Will Transfer to Toilet: with set-up;Independently;ambulating;stand pivot transfer  OT Frequency: Min  2X/week   Barriers to D/C:            Co-evaluation              AM-PAC OT "6 Clicks" Daily Activity     Outcome Measure Help from another person eating meals?: None Help from another person taking care of personal grooming?: None Help from another person toileting, which includes using toliet, bedpan, or urinal?: A Little Help from another person bathing (including washing, rinsing, drying)?: A Little Help from another person to put on and taking off regular upper body clothing?: None Help from another person to put on and taking off regular lower body clothing?: A Little 6 Click Score: 21   End of Session    Activity Tolerance: Patient tolerated treatment well Patient left: in bed;with call bell/phone within reach(pt is mod fall risk so bed alarm not on)  OT Visit Diagnosis: Pain;Other abnormalities of gait and mobility (R26.89) Pain - Right/Left: Right Pain - part of body: Leg                Time: 1410-1435 OT Time Calculation (min): 25 min Charges:  OT General Charges $OT Visit: 1 Visit OT Evaluation $OT Eval Low Complexity: 1 Low OT Treatments $Self Care/Home Management : 8-22 mins  Susanne Borders, OTR/L, [place] [facility] [phone] 12/19/18, 3:02 PM

## 2018-12-19 NOTE — Evaluation (Signed)
Physical Therapy Evaluation Patient Details Name: Sylvia [name] MRN: [id number] DOB: 1955/10/24 Today's Date: 12/19/2018   History of Present Illness  Pt is a 63 y.o. female presenting to hospital 12/14/18 with abdominal pain, nausea, vomiting, and diarrhea.  Pt admitted to hospital with DKA, acute gastroenteritis, leukocytosis, and extensive aortoiliac atherosclerosis.  S/p angio 8/31 R LE d/t PAD with resting pain B LE's.  PMH includes htn, DM, PAD, anxiety, COPD, h/o DVT.  Clinical Impression  Prior to hospital admission, pt was independent with ambulation.  Pt lives with family in 1 level home with ramp access.  Currently pt is SBA semi-supine to sit; min assist initially standing from bed but CGA standing from recliner (with armrests) and chair (without armrests); and able to ambulate 8 feet with RW CGA (limited distance ambulating d/t increasing R LE pain to 9/10 and HR increasing from 87 bpm at rest to 134 bpm (R LE pain 6/10 at rest beginning of session and decreased to 4/10 end of session with HR 85 bpm resting in recliner)); nurse notified of pt's HR and pain during/end of session.  Pt would benefit from skilled PT to address noted impairments and functional limitations (see below for any additional details).  Upon hospital discharge, anticipate pt will be able to progress to discharge home with HHPT (if pt's ambulation distance continues to be limited d/t pain, may consider manual w/c to improve pt's ability to manage at home more independently).    Follow Up Recommendations Home health PT    Equipment Recommendations  Rolling walker with 5" wheels;3in1 (PT)    Recommendations for Other Services       Precautions / Restrictions Precautions Precautions: Fall Restrictions Weight Bearing Restrictions: No      Mobility  Bed Mobility Overal bed mobility: Needs Assistance Bed Mobility: Supine to Sit     Supine to sit: Supervision;HOB elevated     General bed mobility  comments: mild increased effort to perform on own  Transfers Overall transfer level: Needs assistance Equipment used: Rolling walker (2 wheeled) Transfers: Sit to/from [facility] Sit to Stand: Min guard;Min assist Stand pivot transfers: Min guard       General transfer comment: pt requiring vc's to initially square hips sitting edge of bed, scooting forward towards edge of bed, L LE positioning, and B UE positioning in order to stand up to RW; minimal WB'ing noted through R LE upon standing d/t pain and pt preferring to be NWB'ing initially; able to perform stand step turn bed to recliner with RW with vc's for increasing UE support through RW to offweight R LE and also vc's for stepping technique and walker use; vc's for scooting to edge of recliner (and later chair) with vc's for UE/LE placement; stand step turn chair to recliner CGA with minimal vc's for technique  Ambulation/Gait Ambulation/Gait assistance: Min guard Gait Distance (Feet): 8 Feet Assistive device: Rolling walker (2 wheeled) Gait Pattern/deviations: Step-to pattern Gait velocity: decreased   General Gait Details: decreased stance time R LE; vc's to increase UE support through RW to offweight R LE d/t R LE pain; steady with RW  Stairs            Wheelchair Mobility    Modified Rankin (Stroke Patients Only)       Balance Overall balance assessment: Needs assistance Sitting-balance support: No upper extremity supported;Feet supported Sitting balance-Leahy Scale: Good Sitting balance - Comments: steady sitting reaching within BOS   Standing balance support: Single extremity supported Standing  balance-Leahy Scale: Fair Standing balance comment: steady static standing with single UE support                             Pertinent Vitals/Pain Pain Assessment: 0-10 Pain Score: 4  Pain Location: R LE Pain Descriptors / Indicators: Aching;Constant;Discomfort Pain Intervention(s):  Limited activity within patient's tolerance;Monitored during session;Premedicated before session;Repositioned  O2 sats WFL on room air during session's activities    Home Living Family/patient expects to be discharged to:: Private residence Living Arrangements: Parent;Other relatives(Father; grand-adults; great grand-kids) Available Help at Discharge: Family Type of Home: House Home Access: Ramped entrance;Stairs to enter   Entrance Stairs-Number of Steps: 4 with R railing plus 8 with B railing or can use ramp from entrance in back Home Layout: One level Home Equipment: Walker - 2 wheels;Other (comment)(has suction grab bars she can put into tub-shower)      Prior Function Level of Independence: Independent         Comments: Pt reports no falls in past 6 months.     Hand Dominance        Extremity/Trunk Assessment   Upper Extremity Assessment Upper Extremity Assessment: Overall WFL for tasks assessed    Lower Extremity Assessment Lower Extremity Assessment: RLE deficits/detail(L LE WFL with strength and ROM) RLE Deficits / Details: hip flexion at least 3/5 AROM; knee flexion/extension at least 3/5 AROM; DF/PF at least 2+/5 (limited AROM d/t "stiff" feeling and pain with movement); DF limited to neutral d/t pain RLE: Unable to fully assess due to pain    Cervical / Trunk Assessment Cervical / Trunk Assessment: Normal  Communication   Communication: No difficulties  Cognition Arousal/Alertness: Awake/alert Behavior During Therapy: WFL for tasks assessed/performed Overall Cognitive Status: Within Functional Limits for tasks assessed                                        General Comments General comments (skin integrity, edema, etc.): L groin incision covered by band-aid and no increased drainage noted through band-aid beginning to end of session.  Nursing cleared pt for participation in physical therapy and nurse reports vascular wants pt to be up  walking and pt had already been up to the [facility] this morning.  Pt agreeable to PT session.    Exercises Total Joint Exercises Ankle Circles/Pumps: AROM;Strengthening;Both;10 reps;Supine Quad Sets: AROM;Strengthening;Both;10 reps;Supine Short Arc Quad: AROM;Strengthening;Both;10 reps;Supine Heel Slides: AROM;Left;AAROM;Right;Strengthening;10 reps;Supine   Assessment/Plan    PT Assessment Patient needs continued PT services  PT Problem List Decreased strength;Decreased range of motion;Decreased activity tolerance;Decreased balance;Decreased mobility;Decreased knowledge of use of DME;Decreased knowledge of precautions;Cardiopulmonary status limiting activity;Pain;Decreased skin integrity       PT Treatment Interventions DME instruction;Gait training;Stair training;Functional mobility training;Therapeutic activities;Therapeutic exercise;Balance training;Patient/family education    PT Goals (Current goals can be found in the Care Plan section)  Acute Rehab PT Goals Patient Stated Goal: to go home and have less R LE pain PT Goal Formulation: With patient Time For Goal Achievement: 01/02/19 Potential to Achieve Goals: Fair    Frequency Min 2X/week   Barriers to discharge        Co-evaluation               AM-PAC PT "6 Clicks" Mobility  Outcome Measure Help needed turning from your back to your side while in a flat bed without  using bedrails?: A Little Help needed moving from lying on your back to sitting on the side of a flat bed without using bedrails?: A Little Help needed moving to and from a bed to a chair (including a wheelchair)?: A Little Help needed standing up from a chair using your arms (e.g., wheelchair or bedside chair)?: A Little Help needed to walk in hospital room?: A Little Help needed climbing 3-5 steps with a railing? : A Lot 6 Click Score: 17    End of Session Equipment Utilized During Treatment: Gait belt Activity Tolerance: Patient limited by  pain Patient left: in chair;with call bell/phone within reach;with chair alarm set(B LE's elevated via pillow with heels floating) Nurse Communication: Mobility status;Precautions;Other (comment)(pt's pain status and HR status during/end of session) PT Visit Diagnosis: Other abnormalities of gait and mobility (R26.89);Muscle weakness (generalized) (M62.81);Difficulty in walking, not elsewhere classified (R26.2);Pain Pain - Right/Left: Right Pain - part of body: Leg    Time: 3419-6222 PT Time Calculation (min) (ACUTE ONLY): 44 min   Charges:   PT Evaluation $PT Eval Low Complexity: 1 Low PT Treatments $Gait Training: 8-22 mins $Therapeutic Exercise: 8-22 mins       Hendricks Limes, PT 12/19/18, 11:58 AM [phone]

## 2018-12-19 NOTE — Progress Notes (Signed)
[facility] NAME: Sylvia [name]    MR#:  [id number]  DATE OF BIRTH:  02-Jul-1955  Complains of severe pain in the right leg, no abdominal pain, nausea.  Patient has poor palpable pulses.  Extremity warm to touch.  CHIEF COMPLAINT:   Chief Complaint  Patient presents with  . Abdominal Pain     REVIEW OF SYSTEMS:  CONSTITUTIONAL: No fever, fatigue or weakness.  EYES: No blurred or double vision.  EARS, NOSE, AND THROAT: No tinnitus or ear pain.  RESPIRATORY: No cough, shortness of breath, wheezing or hemoptysis.  CARDIOVASCULAR: No chest pain, orthopnea, edema.  GASTROINTESTINAL: Patient has nausea but no vomiting, diarrhea.  GENITOURINARY: No dysuria, hematuria.  ENDOCRINE: No polyuria, nocturia,  HEMATOLOGY: No anemia, easy bruising or bleeding SKIN:  severe right leg pain pSYCHIATRY: No anxiety or depression.     DRUG ALLERGIES:   Allergies  Allergen Reactions  . Penicillins Hives    Has patient had a PCN reaction causing immediate rash, facial/tongue/throat swelling, SOB or lightheadedness with hypotension: Yes Has patient had a PCN reaction causing severe rash involving mucus membranes or skin necrosis: No Has patient had a PCN reaction that required hospitalization: No Has patient had a PCN reaction occurring within the last 10 years: No If all of the above answers are "NO", then may proceed with Cephalosporin use.  . Tramadol Itching    VITALS:  Blood pressure (!) 162/88, pulse 86, temperature 98.9 F (37.2 C), temperature source Oral, resp. rate 18, height 5\' 7"  (1.702 m), weight 70.4 kg, SpO2 99 %.  PHYSICAL EXAMINATION:  GENERAL:  63 y.o.-year-old patient lying in the bed with no acute distress.  EYES: Pupils equal, round, reactive to light and accommodation. No scleral icterus. Extraocular muscles intact.  HEENT: Head atraumatic, normocephalic. Oropharynx and nasopharynx clear.  NECK:  Supple, no jugular  venous distention. No thyroid enlargement, no tenderness.  LUNGS: Normal breath sounds bilaterally, no wheezing, rales,rhonchi or crepitation. No use of accessory muscles of respiration.  CARDIOVASCULAR: S1, S2 normal. No murmurs, rubs, or gallops.  ABDOMEN: Soft, nontender, nondistended. Bowel sounds present. No organomegaly or mass.  EXTREMITIES: Severe right leg pain, patient has right foot warm to touch posterior tibial pulse is present by Doppler, dorsalis pedis pulse is still absent. NEUROLOGIC: Cranial nerves II through XII are intact. Muscle strength 4/5 in all extremities. Sensation intact. Gait not checked.  PSYCHIATRIC: The patient is alert and oriented x 3.  SKIN: No obvious rash, lesion, or ulcer.   Physical Exam LABORATORY PANEL:   CBC Recent Labs  Lab 12/19/18 1013  WBC 9.2  HGB 8.4*  HCT 24.9*  PLT 203   ------------------------------------------------------------------------------------------------------------------  Chemistries  Recent Labs  Lab 12/14/18 1240  12/18/18 0432  NA 141   < > 142  K 3.8   < > 3.3*  CL 104   < > 112*  CO2 20*   < > 23  GLUCOSE 291*   < > 126*  BUN 14   < > 9  CREATININE 1.04*   < > 0.87  CALCIUM 10.8*   < > 8.7*  AST 17  --   --   ALT 15  --   --   ALKPHOS 86  --   --   BILITOT 1.2  --   --    < > = values in this interval not displayed.   ------------------------------------------------------------------------------------------------------------------  Cardiac Enzymes No results for input(s): TROPONINI  in the last 168 hours. ------------------------------------------------------------------------------------------------------------------  RADIOLOGY:  No results found.  ASSESSMENT AND PLAN:   Active Problems:   DKA (diabetic ketoacidoses) (HCC)  DKA with history of diabetes 2. resolved with IV hydration,   acute gastroenteritis. resolved.  Started on IV antibiotics secondary to enteritis and leukocytosis and  possible concern for infection, CT abdomen showed enteritis. Patient received 3 days of IV antibiotics discontinue IV antibiotics as patient has no evidence of abdominal pain or leukocytosis.  Severe peripheral artery disease, of bilateral lower extremity, status post angiogram of right leg with endovascular intervention, status post TPA, Aggrastat, patient still has severe pain in the right leg but no compartment syndrome, continue Toradol, morphine for pain control, patient needs left leg angiogram with possible intervention with vascular on Thursday.  Continue heparin drip History of DVT.  The patient was on Coumadin, off Coumadin for 2 weeks due to recent GI bleeding.  Continue heparin drip.  Patient has history of noncompliance with Coumadin, patient was tried on different blood thinners and her insurance did not cover the cost she was placed on Coumadin and she is not compliant with Coumadin and eating a lot of leafy green vegetables and not taking Coumadin correctly as per documentation by vascular.  Hypertension.  Continue home hypertension medication after resuming diet. COPD.  Stable. All the records are reviewed and case discussed with Care Management/Social Workerr. Management plans discussed with the patient, family and they are in agreement.  CODE STATUS: Full  TOTAL TIME TAKING CARE OF THIS PATIENT: 35 minutes.  More than 50% time spent in counseling, coordination of care   POSSIBLE D/C IN 1-2 DAYS, DEPENDING ON CLINICAL CONDITION.   Katha Hamming M.D on 12/19/2018   Between 7am to 6pm - Pager - [phone]  After 6pm go to [email or web address] - Social research officer, government  [facility]  Office  [phone]  CC: Primary care physician; Center, [facility]  Note: This dictation was prepared with Nurse, children's dictation along with smaller phrase technology. Any transcriptional errors that result from this process are unintentional.

## 2018-12-19 NOTE — Progress Notes (Signed)
ANTICOAGULATION CONSULT NOTE - Initial Consult  Pharmacy Consult for  Heparin  Indication: PAD  Allergies  Allergen Reactions  . Penicillins Hives    Has patient had a PCN reaction causing immediate rash, facial/tongue/throat swelling, SOB or lightheadedness with hypotension: Yes Has patient had a PCN reaction causing severe rash involving mucus membranes or skin necrosis: No Has patient had a PCN reaction that required hospitalization: No Has patient had a PCN reaction occurring within the last 10 years: No If all of the above answers are "NO", then may proceed with Cephalosporin use.  . Tramadol Itching    Patient Measurements: Height: 5\' 7"  (170.2 cm) Weight: 155 lb 3.2 oz (70.4 kg) IBW/kg (Calculated) : 61.6 Heparin Dosing Weight: 70.4 kg   Vital Signs: Temp: 99.3 F (37.4 C) (09/01 1959) Temp Source: Oral (09/01 1959) BP: 119/63 (09/01 1959) Pulse Rate: 92 (09/01 1959)  Labs: Recent Labs    12/17/18 0527  12/17/18 1915 12/18/18 0432 12/19/18 1013 12/19/18 1138 12/19/18 1949  HGB 9.0*  --   --  8.7* 8.4*  --   --   HCT 26.7*  --   --  25.4* 24.9*  --   --   PLT 240  --   --  229 203  --   --   APTT  --   --   --   --   --  32  --   LABPROT  --   --   --   --  14.2  --   --   INR  --   --   --   --  1.1  --   --   HEPARINUNFRC 0.27*   < > 0.34 0.35  --   --  0.16*  CREATININE  --   --   --  0.87  --   --   --    < > = values in this interval not displayed.    Estimated Creatinine Clearance: 64.4 mL/min (by C-G formula based on SCr of 0.87 mg/dL).   Medical History: Past Medical History:  Diagnosis Date  . Anxiety    h/o  . COPD (chronic obstructive pulmonary disease) (HCC)   . Diabetes mellitus without complication (HCC)   . GERD (gastroesophageal reflux disease)   . Hypertension    bp under control-off meds since 2019    Medications:  Medications Prior to Admission  Medication Sig Dispense Refill Last Dose  . albuterol (PROVENTIL HFA;VENTOLIN  HFA) 108 (90 Base) MCG/ACT inhaler Inhale 2 puffs into the lungs every 6 (six) hours as needed for wheezing or shortness of breath. 1 Inhaler 2 prn at prn  . alum & mag hydroxide-simeth (MAALOX/MYLANTA) 200-200-20 MG/5ML suspension Take 30 mLs by mouth every 4 (four) hours as needed for indigestion or heartburn. 355 mL 0 prn at prn  . atorvastatin (LIPITOR) 10 MG tablet Take 1 tablet (10 mg total) by mouth daily. 90 tablet 3 12/13/2018 at Unknown time  . Biotin 1000 MCG tablet Take 1,000 tablets by mouth daily.    12/13/2018 at Unknown time  . cetirizine (ZYRTEC) 10 MG tablet Take 10 mg by mouth daily.   12/13/2018 at Unknown time  . cholecalciferol (VITAMIN D3) 25 MCG (1000 UT) tablet Take 1,000 Units by mouth daily.   12/13/2018 at Unknown time  . iron polysaccharides (NIFEREX) 150 MG capsule Take 1 capsule (150 mg total) by mouth daily. 30 capsule 3 12/13/2018 at Unknown time  . Multiple Vitamins-Minerals (WOMENS MULTIVITAMIN  PO) Take 1 tablet by mouth daily.   12/13/2018 at Unknown time  . pantoprazole (PROTONIX) 40 MG tablet Take 1 tablet (40 mg total) by mouth daily. 30 tablet 0 12/13/2018 at Unknown time  . sitaGLIPtin (JANUVIA) 100 MG tablet Take 100 mg by mouth daily.   12/13/2018 at Unknown time  . vitamin C (ASCORBIC ACID) 500 MG tablet Take 500 mg by mouth daily.   12/13/2018 at Unknown time  . warfarin (COUMADIN) 5 MG tablet Take 1 tablet (5 mg total) by mouth daily at 6 PM. 30 tablet 5 12/13/2018 at Unknown time    Assessment: History of DVT. The patient was on Coumadin 5 mg daily, off Coumadin for 2 weeks due to recent GI bleeding. Vascular suggested to start heparin and holding coumadin (DVT). Heparin was discontinued yesterday and Aggrastat was started while still on Alteplase. Pharmacy was consulted to start heparin for PAD and to restart warfarin.   Given patient recently had tPA, will need to start heparin infusion (without a bolus) once aPTT<80 seconds. Will continue heparin infusion  until INR is closer to goal.    Heparin Course (Prior to new consult on 12/19/18): 8/28 initiation 3800 units bolus, then 950 units/hr 8/28 1926 HL 0.94: dec to 800 units/hr 08/29 @ 0500 HL 0.37 therapeutic. Will continue current rate of 800 units/hr 08/30 @ 0500 HL 0.27 subtherapeutic. Will increase rate to 900 units/hr  08/30 @ 1308 HL 0.33 therapeutic 08/31 HL 0.35 therapeutic.- last level and stopped at 1800 ________________________________________________ Heparin Course (bridging with warfarin)  Aggrastat:  Stopped 9/1 @0934   Alteplase:  Stopped 9/1 @0934   Warfarin:  Drug interaction with ketorolac may increase the risk of bleeding.   Date INR  Warfarin dose   9/1 1.1  ------                       Today's INR is subtherapeutic as expected. Given recent GI bleed, will conservatively boost warfarin dose.   Goal of Therapy:  INR 2-3 Heparin level 0.3-0.5 for the first 24 hours, then 0.3-0.7 thereafter  Monitor platelets by anticoagulation protocol: Yes    Plan:  1. Will order warfarin 7.5 mg tonight x1 dose. Will closely monitor for adverse side effects.  2. Will order heparin 950 units/hr and recheck HL in 6 hours.   9/1:  HL @ 1949 = 0.16 Will order Heparin 2100 units IV X 1 bolus and increase heparin drip to 1200 units/hr. Will recheck HL 6 hrs after rate change.   CBC and INR daily.   Iyari Hagner D 12/19/2018,9:01 PM

## 2018-12-19 NOTE — Consult Note (Signed)
ANTICOAGULATION CONSULT NOTE  Pharmacy Consult for Heparin and Warfarin Indication: PAD  Patient Measurements: Height: 5\' 7"  (170.2 cm) Weight: 155 lb 3.2 oz (70.4 kg) IBW/kg (Calculated) : 61.6 Heparin Dosing Weight: 64.8 kg  Vital Signs: Temp: 98.9 F (37.2 C) (09/01 0804) Temp Source: Oral (09/01 0804) BP: 162/88 (09/01 0804) Pulse Rate: 86 (09/01 0804)  Labs: Recent Labs    12/17/18 0527 12/17/18 1308 12/17/18 1915 12/18/18 0432  HGB 9.0*  --   --  8.7*  HCT 26.7*  --   --  25.4*  PLT 240  --   --  229  HEPARINUNFRC 0.27* 0.33 0.34 0.35  CREATININE  --   --   --  0.87    Estimated Creatinine Clearance: 64.4 mL/min (by C-G formula based on SCr of 0.87 mg/dL).   Medical History: Past Medical History:  Diagnosis Date  . Anxiety    h/o  . COPD (chronic obstructive pulmonary disease) ([place])   . Diabetes mellitus without complication ([place])   . GERD (gastroesophageal reflux disease)   . Hypertension    bp under control-off meds since 2019    Medications:  Medications Prior to Admission  Medication Sig Dispense Refill Last Dose  . albuterol (PROVENTIL HFA;VENTOLIN HFA) 108 (90 Base) MCG/ACT inhaler Inhale 2 puffs into the lungs every 6 (six) hours as needed for wheezing or shortness of breath. 1 Inhaler 2 prn at prn  . alum & mag hydroxide-simeth (MAALOX/MYLANTA) 200-200-20 MG/5ML suspension Take 30 mLs by mouth every 4 (four) hours as needed for indigestion or heartburn. 355 mL 0 prn at prn  . atorvastatin (LIPITOR) 10 MG tablet Take 1 tablet (10 mg total) by mouth daily. 90 tablet 3 12/13/2018 at Unknown time  . Biotin 1000 MCG tablet Take 1,000 tablets by mouth daily.    12/13/2018 at Unknown time  . cetirizine (ZYRTEC) 10 MG tablet Take 10 mg by mouth daily.   12/13/2018 at Unknown time  . cholecalciferol (VITAMIN D3) 25 MCG (1000 UT) tablet Take 1,000 Units by mouth daily.   12/13/2018 at Unknown time  . iron polysaccharides (NIFEREX) 150 MG capsule Take 1 capsule  (150 mg total) by mouth daily. 30 capsule 3 12/13/2018 at Unknown time  . Multiple Vitamins-Minerals (WOMENS MULTIVITAMIN PO) Take 1 tablet by mouth daily.   12/13/2018 at Unknown time  . pantoprazole (PROTONIX) 40 MG tablet Take 1 tablet (40 mg total) by mouth daily. 30 tablet 0 12/13/2018 at Unknown time  . sitaGLIPtin (JANUVIA) 100 MG tablet Take 100 mg by mouth daily.   12/13/2018 at Unknown time  . vitamin C (ASCORBIC ACID) 500 MG tablet Take 500 mg by mouth daily.   12/13/2018 at Unknown time  . warfarin (COUMADIN) 5 MG tablet Take 1 tablet (5 mg total) by mouth daily at 6 PM. 30 tablet 5 12/13/2018 at Unknown time   Scheduled:  . atorvastatin  10 mg Oral Daily  . docusate sodium  100 mg Oral BID  . insulin aspart  0-5 Units Subcutaneous QHS  . insulin aspart  0-9 Units Subcutaneous TID WC  . iron polysaccharides  150 mg Oral Daily  . ketorolac  30 mg Intravenous Q6H  . linagliptin  5 mg Oral Daily  . metoCLOPramide  5 mg Oral TID AC  . multivitamin with minerals  1 tablet Oral Daily  . pantoprazole  40 mg Oral Daily  . potassium chloride  40 mEq Oral Once  . sodium chloride flush  3 mL Intravenous  Q12H   Infusions:   PRN: morphine injection, ondansetron (ZOFRAN) IV, oxyCODONE, oxyCODONE Anti-infectives (From admission, onward)   Start     Dose/Rate Route Frequency Ordered Stop   12/18/18 1600  clindamycin (CLEOCIN) IVPB 300 mg     300 mg 100 mL/hr over 30 Minutes Intravenous  Once 12/18/18 1548 12/18/18 1735   12/18/18 0100  clindamycin (CLEOCIN) IVPB 300 mg     300 mg 100 mL/hr over 30 Minutes Intravenous  Once 12/18/18 0058 12/18/18 0922   12/15/18 2000  ciprofloxacin (CIPRO) IVPB 400 mg  Status:  Discontinued     400 mg 200 mL/hr over 60 Minutes Intravenous Every 12 hours 12/15/18 1811 12/18/18 1138   12/15/18 1800  metroNIDAZOLE (FLAGYL) IVPB 500 mg  Status:  Discontinued     500 mg 100 mL/hr over 60 Minutes Intravenous Every 8 hours 12/15/18 1751 12/18/18 1138       Assessment: History of DVT.  The patient was on Coumadin 5 mg daily, off Coumadin for 2 weeks due to recent GI bleeding. Vascular suggested to start heparin and holding coumadin (DVT). Heparin was discontinued yesterday and Aggrastat was started while still on Alteplase. Pharmacy was consulted to start heparin for PAD and to restart warfarin.   Given patient recently had tPA, will need to start heparin infusion (without a bolus) once aPTT<80 seconds. Will continue heparin infusion until INR is closer to goal.    Heparin Course (Prior to new consult on 12/19/18): 8/28 initiation 3800 units bolus, then 950 units/hr 8/28 1926 HL 0.94: dec to 800 units/hr 08/29 @ 0500 HL 0.37 therapeutic. Will continue current rate of 800 units/hr 08/30 @ 0500 HL 0.27 subtherapeutic. Will increase rate to 900 units/hr  08/30 @ 1308 HL 0.33 therapeutic 08/31 HL 0.35 therapeutic.- last level and stopped at 1800 ________________________________________________ Heparin Course (bridging with warfarin)  Aggrastat:  Stopped 9/1 @0934   Alteplase:  Stopped 9/1 @0934   Warfarin:  Date INR  Warfarin dose   9/1                          Goal of Therapy:  INR 2-3 Heparin level 0.3-0.5 for the first 24 hours, then 0.37-0.7 thereafter units/ml Monitor platelets by anticoagulation protocol: Yes    Plan:  Will order BL labs.   , PharmD 12/19/2018,9:57 AM

## 2018-12-19 NOTE — Progress Notes (Signed)
pts continues to c/o severe R leg and foot pain- pt is not able to bear weight due to pain - also R dorsalis pedis pulse not present with doppler / R posterior tibial pulse present with doppler only  Dr. Lucky Cowboy and Dr. Vianne Bulls made aware/ will continue to monitor.

## 2018-12-19 NOTE — Progress Notes (Signed)
[facility] Daily Progress Note   Subjective: 1 Day Post-Op: 1. Ultrasound guidance for vascular access left femoral artery 2. Catheter placement into right common femoral artery from left femoral approach 3. Aortogram and selective right lower extremity angiogram 4.  Mechanical thrombectomy with penumbra CAT 6 device to the right SFA, popliteal artery, tibioperoneal trunk, and proximal posterior tibial arteries 5.  Percutaneous transluminal angioplasty of the right tibioperoneal trunk and posterior tibial artery with 3 mm diameter by 22 cm length angioplasty balloon             6.  Percutaneous transluminal angioplasty of the right popliteal artery with 4 mm diameter by 22 cm length [name] 7.  Percutaneous transluminal angioplasty of the right SFA with 5 mm diameter by 30 cm length [name]             8.   Placement of 8 mg of TPA to the right SFA, popliteal artery, and tibioperoneal trunk as well as placement of an infusion catheter for continuous thrombolytic therapy following the procedure using a 130 cm total length 50 cm working length lysis catheter  With take back to endovascular suite the same day for:  1. RLE angiogram 2. Catheter placement into right peroneal artery from left femoral approach 3.  Mechanical thrombectomy to the right SFA, popliteal artery, tibioperoneal trunk, and peroneal artery 4. Percutaneous transluminal angioplasty of the right peroneal artery and tibioperoneal trunk with 2.5 mm diameter angioplasty balloon 5.  Percutaneous transluminal angioplasty of the right popliteal artery with 4 mm diameter [name]             6.  Percutaneous transluminal angioplasty of the right proximal SFA with 5 mm diameter  [name]             7.  Viabahn stent placement to the right popliteal artery with 5 mm diameter by 10 cm length stent 8. StarClose closure device left femoral artery  Patient with progressively worsening right lower extremity pain overnight into this AM.  Objective: Vitals:   12/18/18 2012 12/19/18 0404 12/19/18 0409 12/19/18 0804  BP: 134/67 (!) 170/79  (!) 162/88  Pulse: 80 82  86  Resp: 20 20  18   Temp: 99.2 F (37.3 C) 99.6 F (37.6 C)  98.9 F (37.2 C)  TempSrc: Oral Oral  Oral  SpO2: 100% 100%  99%  Weight:   70.4 kg   Height:        Intake/Output Summary (Last 24 hours) at 12/19/2018 1153 Last data filed at 12/19/2018 0909 Gross per 24 hour  Intake 656.86 ml  Output 3050 ml  Net -2393.14 ml   Physical Exam: A&Ox3, NAD CV: RRR Pulmonary: CTA Bilaterally Abdomen: Soft, Nontender, Nondistended Vascular:  Right lower extremity: Thigh soft.  Calf soft.  Extremity is warm distally to approximately toes then becomes cooler.  Dopplerable PT on exam.  Sensory intact throughout toes 2-4.  Motor intact.  No signs of compartment syndrome.   Laboratory: CBC    Component Value Date/Time   WBC 9.2 12/19/2018 1013   HGB 8.4 (L) 12/19/2018 1013   HCT 24.9 (L) 12/19/2018 1013   PLT 203 12/19/2018 1013   BMET    Component Value Date/Time   NA 142 12/18/2018 0432   K 3.3 (L) 12/18/2018 0432   CL 112 (H) 12/18/2018 0432   CO2 23 12/18/2018 0432   GLUCOSE 126 (H) 12/18/2018 4008  BUN 9 12/18/2018 0432   CREATININE 0.87 12/18/2018 0432   CALCIUM 8.7 (L) 12/18/2018 0432   GFRNONAA >60 12/18/2018 0432   GFRAA >60 12/18/2018 0432   Assessment/Planning: The patient is a 63 year old female with a past medical history of severe peripheral artery disease to the bilateral lower extremity. 1) POD#1 from right lower extremity endovascular intervention requiring lysis catheter with TPA infusion and take back to the endovascular suite for  continued intervention.  At conclusion of the procedure, successful revascularization of the right lower extremity. 2) patient is experiencing worsening right lower extremity this night into this AM.  There is no signs of compartment syndrome on exam I would highly suspect this is reperfusion syndrome.  I have added Toradol adjusted her Percocet and morphine for improved pain control 3) patient also has severe disease to left lower extremity.  We will plan on a left lower extremity angiogram with possible intervention with Dr. dew on Thursday. Procedure, risks and benefits explained to the patient again.  All questions answered.  The patient wishes to proceed. 4) we will stop Aggrastat and initiate heparin today.  We will keep the patient on heparin until her Coumadin is therapeutic. 5) patient with a known history of noncompliance with Coumadin.  Patient has history of eating a lot of leafy green vegetables or not taking Coumadin correctly.  Unfortunately, other blood thinners have been tried and her insurance did not cover the cost and she was placed on Coumadin.  I have consulted the dietitian and attempt to provide education regarding diet and its effect on Coumadin.  Discussed with Dr. Wallis Mart Zoe Creasman PA-C 12/19/2018 11:53 AM

## 2018-12-19 NOTE — Progress Notes (Signed)
Patients right foot is cool to touch, but the pulses are still audible via doppler.

## 2018-12-19 NOTE — Consult Note (Addendum)
ANTICOAGULATION CONSULT NOTE  Pharmacy Consult for Heparin and Warfarin Indication: PAD  Patient Measurements: Height: 5\' 7"  (170.2 cm) Weight: 155 lb 3.2 oz (70.4 kg) IBW/kg (Calculated) : 61.6 Heparin Dosing Weight:70.4 kg  Vital Signs: Temp: 98.9 F (37.2 C) (09/01 0804) Temp Source: Oral (09/01 0804) BP: 162/88 (09/01 0804) Pulse Rate: 86 (09/01 0804)  Labs: Recent Labs    12/17/18 0527 12/17/18 1308 12/17/18 1915 12/18/18 0432 12/19/18 1013 12/19/18 1138  HGB 9.0*  --   --  8.7* 8.4*  --   HCT 26.7*  --   --  25.4* 24.9*  --   PLT 240  --   --  229 203  --   APTT  --   --   --   --   --  32  LABPROT  --   --   --   --  14.2  --   INR  --   --   --   --  1.1  --   HEPARINUNFRC 0.27* 0.33 0.34 0.35  --   --   CREATININE  --   --   --  0.87  --   --     Estimated Creatinine Clearance: 64.4 mL/min (by C-G formula based on SCr of 0.87 mg/dL).   Medical History: Past Medical History:  Diagnosis Date  . Anxiety    h/o  . COPD (chronic obstructive pulmonary disease) ([place])   . Diabetes mellitus without complication ([place])   . GERD (gastroesophageal reflux disease)   . Hypertension    bp under control-off meds since 2019    Medications:  Medications Prior to Admission  Medication Sig Dispense Refill Last Dose  . albuterol (PROVENTIL HFA;VENTOLIN HFA) 108 (90 Base) MCG/ACT inhaler Inhale 2 puffs into the lungs every 6 (six) hours as needed for wheezing or shortness of breath. 1 Inhaler 2 prn at prn  . alum & mag hydroxide-simeth (MAALOX/MYLANTA) 200-200-20 MG/5ML suspension Take 30 mLs by mouth every 4 (four) hours as needed for indigestion or heartburn. 355 mL 0 prn at prn  . atorvastatin (LIPITOR) 10 MG tablet Take 1 tablet (10 mg total) by mouth daily. 90 tablet 3 12/13/2018 at Unknown time  . Biotin 1000 MCG tablet Take 1,000 tablets by mouth daily.    12/13/2018 at Unknown time  . cetirizine (ZYRTEC) 10 MG tablet Take 10 mg by mouth daily.   12/13/2018 at Unknown  time  . cholecalciferol (VITAMIN D3) 25 MCG (1000 UT) tablet Take 1,000 Units by mouth daily.   12/13/2018 at Unknown time  . iron polysaccharides (NIFEREX) 150 MG capsule Take 1 capsule (150 mg total) by mouth daily. 30 capsule 3 12/13/2018 at Unknown time  . Multiple Vitamins-Minerals (WOMENS MULTIVITAMIN PO) Take 1 tablet by mouth daily.   12/13/2018 at Unknown time  . pantoprazole (PROTONIX) 40 MG tablet Take 1 tablet (40 mg total) by mouth daily. 30 tablet 0 12/13/2018 at Unknown time  . sitaGLIPtin (JANUVIA) 100 MG tablet Take 100 mg by mouth daily.   12/13/2018 at Unknown time  . vitamin C (ASCORBIC ACID) 500 MG tablet Take 500 mg by mouth daily.   12/13/2018 at Unknown time  . warfarin (COUMADIN) 5 MG tablet Take 1 tablet (5 mg total) by mouth daily at 6 PM. 30 tablet 5 12/13/2018 at Unknown time   Scheduled:  . atorvastatin  10 mg Oral Daily  . docusate sodium  100 mg Oral BID  . insulin aspart  0-5 Units Subcutaneous  QHS  . insulin aspart  0-9 Units Subcutaneous TID WC  . iron polysaccharides  150 mg Oral Daily  . ketorolac  30 mg Intravenous Q6H  . linagliptin  5 mg Oral Daily  . metoCLOPramide  5 mg Oral TID AC  . multivitamin with minerals  1 tablet Oral Daily  . pantoprazole  40 mg Oral Daily  . polyethylene glycol  17 g Oral Daily  . sodium chloride flush  3 mL Intravenous Q12H   Infusions:  . heparin     PRN: morphine injection, ondansetron (ZOFRAN) IV, oxyCODONE, oxyCODONE Anti-infectives (From admission, onward)   Start     Dose/Rate Route Frequency Ordered Stop   12/18/18 1600  clindamycin (CLEOCIN) IVPB 300 mg     300 mg 100 mL/hr over 30 Minutes Intravenous  Once 12/18/18 1548 12/18/18 1735   12/18/18 0100  clindamycin (CLEOCIN) IVPB 300 mg     300 mg 100 mL/hr over 30 Minutes Intravenous  Once 12/18/18 0058 12/18/18 0922   12/15/18 2000  ciprofloxacin (CIPRO) IVPB 400 mg  Status:  Discontinued     400 mg 200 mL/hr over 60 Minutes Intravenous Every 12 hours 12/15/18  1811 12/18/18 1138   12/15/18 1800  metroNIDAZOLE (FLAGYL) IVPB 500 mg  Status:  Discontinued     500 mg 100 mL/hr over 60 Minutes Intravenous Every 8 hours 12/15/18 1751 12/18/18 1138      Assessment: History of DVT.  The patient was on Coumadin 5 mg daily, off Coumadin for 2 weeks due to recent GI bleeding. Vascular suggested to start heparin and holding coumadin (DVT). Heparin was discontinued yesterday and Aggrastat was started while still on Alteplase. Pharmacy was consulted to start heparin for PAD and to restart warfarin.   Given patient recently had tPA, will need to start heparin infusion (without a bolus) once aPTT<80 seconds. Will continue heparin infusion until INR is closer to goal.    Heparin Course (Prior to new consult on 12/19/18): 8/28 initiation 3800 units bolus, then 950 units/hr 8/28 1926 HL 0.94: dec to 800 units/hr 08/29 @ 0500 HL 0.37 therapeutic. Will continue current rate of 800 units/hr 08/30 @ 0500 HL 0.27 subtherapeutic. Will increase rate to 900 units/hr  08/30 @ 1308 HL 0.33 therapeutic 08/31 HL 0.35 therapeutic.- last level and stopped at 1800 ________________________________________________ Heparin Course (bridging with warfarin)  Aggrastat:  Stopped 9/1 @0934   Alteplase:  Stopped 9/1 @0934   Warfarin:  Drug interaction with ketorolac may increase the risk of bleeding.   Date INR  Warfarin dose   9/1 1.1  ------                       Today's INR is subtherapeutic as expected. Given recent GI bleed, will conservatively boost warfarin dose.   Goal of Therapy:  INR 2-3 Heparin level 0.3-0.5 for the first 24 hours, then 0.3-0.7 thereafter  Monitor platelets by anticoagulation protocol: Yes    Plan:  1. Will order warfarin 7.5 mg tonight x1 dose. Will closely monitor for adverse side effects.  2. Will order heparin 950 units/hr and recheck HL in 6 hours.  CBC and INR daily.   , PharmD 12/19/2018,12:53 PM

## 2018-12-19 NOTE — Plan of Care (Signed)
  Problem: Education: Goal: Knowledge of General Education information will improve Description Including pain rating scale, medication(s)/side effects and non-pharmacologic comfort measures  Outcome: Progressing   Problem: Skin Integrity: Goal: Risk for impaired skin integrity will decrease Outcome: Progressing   Problem: Tissue Perfusion: Goal: Adequacy of tissue perfusion will improve Outcome: Progressing   

## 2018-12-20 ENCOUNTER — Other Ambulatory Visit (INDEPENDENT_AMBULATORY_CARE_PROVIDER_SITE_OTHER): Payer: Self-pay | Admitting: Vascular Surgery

## 2018-12-20 LAB — GLUCOSE, CAPILLARY
Glucose-Capillary: 201 mg/dL — ABNORMAL HIGH (ref 70–99)
Glucose-Capillary: 127 mg/dL — ABNORMAL HIGH (ref 70–99)
Glucose-Capillary: 113 mg/dL — ABNORMAL HIGH (ref 70–99)
Glucose-Capillary: 273 mg/dL — ABNORMAL HIGH (ref 70–99)
Glucose-Capillary: 164 mg/dL — ABNORMAL HIGH (ref 70–99)

## 2018-12-20 LAB — BASIC METABOLIC PANEL
Anion gap: 8 (ref 5–15)
BUN: 11 mg/dL (ref 8–23)
CO2: 24 mmol/L (ref 22–32)
Calcium: 8.8 mg/dL — ABNORMAL LOW (ref 8.9–10.3)
Chloride: 109 mmol/L (ref 98–111)
Creatinine, Ser: 0.73 mg/dL (ref 0.44–1.00)
GFR calc Af Amer: >60 mL/min (ref >60)
GFR calc non Af Amer: >60 mL/min (ref >60)
Glucose, Bld: 120 mg/dL — ABNORMAL HIGH (ref 70–99)
Potassium: 3.7 mmol/L (ref 3.5–5.1)
Sodium: 141 mmol/L (ref 135–145)

## 2018-12-20 LAB — CBC
HCT: 21.8 % — ABNORMAL LOW (ref 36.0–46.0)
Hemoglobin: 7.4 g/dL — ABNORMAL LOW (ref 12.0–15.0)
MCH: 27.7 pg (ref 26.0–34.0)
MCHC: 33.9 g/dL (ref 30.0–36.0)
MCV: 81.6 fL (ref 80.0–100.0)
Platelets: 209 10*3/uL (ref 150–400)
RBC: 2.67 MIL/uL — ABNORMAL LOW (ref 3.87–5.11)
RDW: 14.3 % (ref 11.5–15.5)
WBC: 10.6 10*3/uL — ABNORMAL HIGH (ref 4.0–10.5)
nRBC: 0 % (ref 0.0–0.2)

## 2018-12-20 LAB — PROTIME-INR
INR: 1.2 (ref 0.8–1.2)
Prothrombin Time: 14.9 seconds (ref 11.4–15.2)

## 2018-12-20 LAB — MAGNESIUM: Magnesium: 1.7 mg/dL (ref 1.7–2.4)

## 2018-12-20 LAB — HEPARIN LEVEL (UNFRACTIONATED)
Heparin Unfractionated: 0.36 IU/mL (ref 0.30–0.70)
Heparin Unfractionated: 0.38 IU/mL (ref 0.30–0.70)

## 2018-12-20 MED ORDER — SODIUM CHLORIDE 0.9 % IV SOLN
INTRAVENOUS | Status: DC
  Administered 2018-12-21 – 2018-12-28 (×10): via INTRAVENOUS

## 2018-12-20 MED ORDER — CLINDAMYCIN PHOSPHATE 300 MG/50ML IV SOLN
300.0000 mg | Freq: Once | INTRAVENOUS | Status: AC
  Administered 2018-12-21: 300 mg via INTRAVENOUS
  Filled 2018-12-20: qty 50

## 2018-12-20 MED ORDER — BISACODYL 5 MG PO TBEC
5.0000 mg | DELAYED_RELEASE_TABLET | Freq: Two times a day (BID) | ORAL | Status: DC | PRN
  Administered 2018-12-20: 5 mg via ORAL
  Filled 2018-12-20: qty 1

## 2018-12-20 MED ORDER — WARFARIN SODIUM 10 MG PO TABS
10.0000 mg | ORAL_TABLET | Freq: Once | ORAL | Status: AC
  Administered 2018-12-20: 10 mg via ORAL
  Filled 2018-12-20: qty 1

## 2018-12-20 NOTE — Progress Notes (Signed)
[facility] for  Heparin and Warfarin Indication: PAD  Allergies  Allergen Reactions  . Penicillins Hives    Has patient had a PCN reaction causing immediate rash, facial/tongue/throat swelling, SOB or lightheadedness with hypotension: Yes Has patient had a PCN reaction causing severe rash involving mucus membranes or skin necrosis: No Has patient had a PCN reaction that required hospitalization: No Has patient had a PCN reaction occurring within the last 10 years: No If all of the above answers are "NO", then may proceed with Cephalosporin use.  . Tramadol Itching   Patient Measurements: Height: 5\' 7"  (170.2 cm) Weight: 155 lb 3.2 oz (70.4 kg) IBW/kg (Calculated) : 61.6 Heparin Dosing Weight: 70.4 kg   Vital Signs: Temp: 99.5 F (37.5 C) (09/02 0410) Temp Source: Oral (09/02 0410) BP: 137/72 (09/02 0410) Pulse Rate: 89 (09/02 0410)  Labs: Recent Labs    12/18/18 0432 12/19/18 1013 12/19/18 1138 12/19/18 1949 12/20/18 0320  HGB 8.7* 8.4*  --   --  7.4*  HCT 25.4* 24.9*  --   --  21.8*  PLT 229 203  --   --  209  APTT  --   --  32  --   --   LABPROT  --  14.2  --   --  14.9  INR  --  1.1  --   --  1.2  HEPARINUNFRC 0.35  --   --  0.16* 0.36  CREATININE 0.87  --   --   --  0.73    Estimated Creatinine Clearance: 70 mL/min (by C-G formula based on SCr of 0.73 mg/dL).   Medical History: Past Medical History:  Diagnosis Date  . Anxiety    h/o  . COPD (chronic obstructive pulmonary disease) ([place])   . Diabetes mellitus without complication ([place])   . GERD (gastroesophageal reflux disease)   . Hypertension    bp under control-off meds since 2019    Medications:  Medications Prior to Admission  Medication Sig Dispense Refill Last Dose  . albuterol (PROVENTIL HFA;VENTOLIN HFA) 108 (90 Base) MCG/ACT inhaler Inhale 2 puffs into the lungs every 6 (six) hours as needed for wheezing or shortness of breath. 1 Inhaler 2 prn at prn  .  alum & mag hydroxide-simeth (MAALOX/MYLANTA) 200-200-20 MG/5ML suspension Take 30 mLs by mouth every 4 (four) hours as needed for indigestion or heartburn. 355 mL 0 prn at prn  . atorvastatin (LIPITOR) 10 MG tablet Take 1 tablet (10 mg total) by mouth daily. 90 tablet 3 12/13/2018 at Unknown time  . Biotin 1000 MCG tablet Take 1,000 tablets by mouth daily.    12/13/2018 at Unknown time  . cetirizine (ZYRTEC) 10 MG tablet Take 10 mg by mouth daily.   12/13/2018 at Unknown time  . cholecalciferol (VITAMIN D3) 25 MCG (1000 UT) tablet Take 1,000 Units by mouth daily.   12/13/2018 at Unknown time  . iron polysaccharides (NIFEREX) 150 MG capsule Take 1 capsule (150 mg total) by mouth daily. 30 capsule 3 12/13/2018 at Unknown time  . Multiple Vitamins-Minerals (WOMENS MULTIVITAMIN PO) Take 1 tablet by mouth daily.   12/13/2018 at Unknown time  . pantoprazole (PROTONIX) 40 MG tablet Take 1 tablet (40 mg total) by mouth daily. 30 tablet 0 12/13/2018 at Unknown time  . sitaGLIPtin (JANUVIA) 100 MG tablet Take 100 mg by mouth daily.   12/13/2018 at Unknown time  . vitamin C (ASCORBIC ACID) 500 MG tablet Take 500 mg by mouth  daily.   12/13/2018 at Unknown time  . warfarin (COUMADIN) 5 MG tablet Take 1 tablet (5 mg total) by mouth daily at 6 PM. 30 tablet 5 12/13/2018 at Unknown time    Assessment: History of DVT. The patient was on Coumadin 5 mg daily, off Coumadin for 2 weeks due to recent GI bleeding. Vascular suggested to start heparin and holding coumadin (DVT). Heparin was discontinued yesterday and Aggrastat was started while still on Alteplase. Pharmacy was consulted to start heparin for PAD and to restart warfarin.   Given patient recently had tPA, will need to start heparin infusion (without a bolus) once aPTT<80 seconds. Will continue heparin infusion until INR is closer to goal.    Heparin Course: 9/1:  HL @ 1949 = 0.16 9/2:  HL @ 0320 = 0.36, therapeutic on 1200 units/hr   Warfarin:  Drug  interaction with ketorolac may increase the risk of bleeding.   Date INR  Warfarin dose   9/1 1.1  7.5 mg  9/2 1.2                    Today's INR is subtherapeutic as expected. Given recent GI bleed, will conservatively boost warfarin dose.   Goal of Therapy:  INR 2-3 Heparin level 0.3-0.5 for the first 24 hours, then 0.3-0.7 thereafter  Monitor platelets by anticoagulation protocol: Yes    Plan:  1. Will order warfarin 10 mg tonight x1 dose. Will closely monitor for adverse side effects.  2. Heparin level due @ 1100 today. Will follow with HL at that time.   CBC and INR daily.   Katha Cabal 12/20/2018,7:36 AM

## 2018-12-20 NOTE — Progress Notes (Signed)
ANTICOAGULATION CONSULT NOTE   Pharmacy Consult for  Heparin  Indication: PAD  Allergies  Allergen Reactions  . Penicillins Hives    Has patient had a PCN reaction causing immediate rash, facial/tongue/throat swelling, SOB or lightheadedness with hypotension: Yes Has patient had a PCN reaction causing severe rash involving mucus membranes or skin necrosis: No Has patient had a PCN reaction that required hospitalization: No Has patient had a PCN reaction occurring within the last 10 years: No If all of the above answers are "NO", then may proceed with Cephalosporin use.  . Tramadol Itching   Patient Measurements: Height: 5\' 7"  (170.2 cm) Weight: 155 lb 3.2 oz (70.4 kg) IBW/kg (Calculated) : 61.6 Heparin Dosing Weight: 70.4 kg   Vital Signs: Temp: 99.5 F (37.5 C) (09/02 0410) Temp Source: Oral (09/02 0410) BP: 137/72 (09/02 0410) Pulse Rate: 89 (09/02 0410)  Labs: Recent Labs    12/17/18 0527  12/18/18 0432 12/19/18 1013 12/19/18 1138 12/19/18 1949 12/20/18 0320  HGB 9.0*  --  8.7* 8.4*  --   --   --   HCT 26.7*  --  25.4* 24.9*  --   --   --   PLT 240  --  229 203  --   --   --   APTT  --   --   --   --  32  --   --   LABPROT  --   --   --  14.2  --   --  14.9  INR  --   --   --  1.1  --   --  1.2  HEPARINUNFRC 0.27*   < > 0.35  --   --  0.16* 0.36  CREATININE  --   --  0.87  --   --   --   --    < > = values in this interval not displayed.    Estimated Creatinine Clearance: 64.4 mL/min (by C-G formula based on SCr of 0.87 mg/dL).   Medical History: Past Medical History:  Diagnosis Date  . Anxiety    h/o  . COPD (chronic obstructive pulmonary disease) ([facility])   . Diabetes mellitus without complication ([facility])   . GERD (gastroesophageal reflux disease)   . Hypertension    bp under control-off meds since 2019    Medications:  Medications Prior to Admission  Medication Sig Dispense Refill Last Dose  . albuterol (PROVENTIL HFA;VENTOLIN HFA) 108 (90 Base)  MCG/ACT inhaler Inhale 2 puffs into the lungs every 6 (six) hours as needed for wheezing or shortness of breath. 1 Inhaler 2 prn at prn  . alum & mag hydroxide-simeth (MAALOX/MYLANTA) 200-200-20 MG/5ML suspension Take 30 mLs by mouth every 4 (four) hours as needed for indigestion or heartburn. 355 mL 0 prn at prn  . atorvastatin (LIPITOR) 10 MG tablet Take 1 tablet (10 mg total) by mouth daily. 90 tablet 3 12/13/2018 at Unknown time  . Biotin 1000 MCG tablet Take 1,000 tablets by mouth daily.    12/13/2018 at Unknown time  . cetirizine (ZYRTEC) 10 MG tablet Take 10 mg by mouth daily.   12/13/2018 at Unknown time  . cholecalciferol (VITAMIN D3) 25 MCG (1000 UT) tablet Take 1,000 Units by mouth daily.   12/13/2018 at Unknown time  . iron polysaccharides (NIFEREX) 150 MG capsule Take 1 capsule (150 mg total) by mouth daily. 30 capsule 3 12/13/2018 at Unknown time  . Multiple Vitamins-Minerals (WOMENS MULTIVITAMIN PO) Take 1 tablet by mouth daily.  12/13/2018 at Unknown time  . pantoprazole (PROTONIX) 40 MG tablet Take 1 tablet (40 mg total) by mouth daily. 30 tablet 0 12/13/2018 at Unknown time  . sitaGLIPtin (JANUVIA) 100 MG tablet Take 100 mg by mouth daily.   12/13/2018 at Unknown time  . vitamin C (ASCORBIC ACID) 500 MG tablet Take 500 mg by mouth daily.   12/13/2018 at Unknown time  . warfarin (COUMADIN) 5 MG tablet Take 1 tablet (5 mg total) by mouth daily at 6 PM. 30 tablet 5 12/13/2018 at Unknown time    Assessment: History of DVT. The patient was on Coumadin 5 mg daily, off Coumadin for 2 weeks due to recent GI bleeding. Vascular suggested to start heparin and holding coumadin (DVT). Heparin was discontinued yesterday and Aggrastat was started while still on Alteplase. Pharmacy was consulted to start heparin for PAD and to restart warfarin.   Given patient recently had tPA, will need to start heparin infusion (without a bolus) once aPTT<80 seconds. Will continue heparin infusion until INR is closer  to goal.    Heparin Course (Prior to new consult on 12/19/18): 8/28 initiation 3800 units bolus, then 950 units/hr 8/28 1926 HL 0.94: dec to 800 units/hr 08/29 @ 0500 HL 0.37 therapeutic. Will continue current rate of 800 units/hr 08/30 @ 0500 HL 0.27 subtherapeutic. Will increase rate to 900 units/hr  08/30 @ 1308 HL 0.33 therapeutic 08/31 HL 0.35 therapeutic.- last level and stopped at 1800 ________________________________________________ Heparin Course (bridging with warfarin)  Aggrastat:  Stopped 9/1 @0934   Alteplase:  Stopped 9/1 @0934   Warfarin:  Drug interaction with ketorolac may increase the risk of bleeding.   Date INR  Warfarin dose   9/1 1.1  ------                       Today's INR is subtherapeutic as expected. Given recent GI bleed, will conservatively boost warfarin dose.   Goal of Therapy:  INR 2-3 Heparin level 0.3-0.5 for the first 24 hours, then 0.3-0.7 thereafter  Monitor platelets by anticoagulation protocol: Yes    Plan:  1. Will order warfarin 7.5 mg tonight x1 dose. Will closely monitor for adverse side effects.  2. Will order heparin 950 units/hr and recheck HL in 6 hours.   9/1:  HL @ 1949 = 0.16 9/2:  HL @ 0320 = 0.36, therapeutic on 1200 units/hr  Will continue heparin drip at 1200 units/hr. Will recheck HL in 6 hrs to confirm stable   CBC and INR daily.   [place] Crane, Maxamillian Tienda A 12/20/2018,4:35 AM

## 2018-12-20 NOTE — Progress Notes (Signed)
Pt has not had a BM since 8/27... has taken miralax and colace without relief, MD paged, Dr. Sidney Ace gave orders for dulcolax 5mg  BID PRN.

## 2018-12-20 NOTE — Progress Notes (Signed)
[facility]  Daily Progress Note   Subjective  - 2 Days Post-Op  Patient having numbness and pain in the toes on the right foot.  Her Doppler signals are no longer strong on the right.  The foot is still warm down to the midfoot. Previously scheduled for left leg angiogram tomorrow.  Objective Vitals:   12/19/18 1959 12/20/18 0410 12/20/18 0848 12/20/18 1639  BP: 119/63 137/72 109/69 138/63  Pulse: 92 89 93 91  Resp: 20 20    Temp: 99.3 F (37.4 C) 99.5 F (37.5 C) 99.1 F (37.3 C) 99.2 F (37.3 C)  TempSrc: Oral Oral Oral Oral  SpO2: 96% 100% 100% 100%  Weight:      Height:        Intake/Output Summary (Last 24 hours) at 12/20/2018 1831 Last data filed at 12/20/2018 1645 Gross per 24 hour  Intake 168.63 ml  Output 900 ml  Net -731.37 ml    PULM  CTAB CV  RRR VASC  right foot as described above  Laboratory CBC    Component Value Date/Time   WBC 10.6 (H) 12/20/2018 0320   HGB 7.4 (L) 12/20/2018 0320   HCT 21.8 (L) 12/20/2018 0320   PLT 209 12/20/2018 0320    BMET    Component Value Date/Time   NA 141 12/20/2018 0320   K 3.7 12/20/2018 0320   CL 109 12/20/2018 0320   CO2 24 12/20/2018 0320   GLUCOSE 120 (H) 12/20/2018 0320   BUN 11 12/20/2018 0320   CREATININE 0.73 12/20/2018 0320   CALCIUM 8.8 (L) 12/20/2018 0320   GFRNONAA >60 12/20/2018 0320   GFRAA >60 12/20/2018 0320    Assessment/Planning: POD #2 s/p RLE revascularization   Several mottled toes on the right foot in the right foot is a little cooler.  She is scheduled for left leg angiogram tomorrow and we will also assess the perfusion on the right leg.  If she has had an early rethrombosis on heparin this is not a good sign.  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  Overall she is a very high risk of limb loss bilaterally and this is a very difficult situation.  This was discussed with the patient in detail who voices her understanding.  We will continue the  heparin drip for now given her continued ischemic issues    Leotis Pain  12/20/2018, 6:31 PM

## 2018-12-20 NOTE — Progress Notes (Addendum)
[place] for  Heparin and Warfarin Indication: PAD  Allergies  Allergen Reactions  . Penicillins Hives    Has patient had a PCN reaction causing immediate rash, facial/tongue/throat swelling, SOB or lightheadedness with hypotension: Yes Has patient had a PCN reaction causing severe rash involving mucus membranes or skin necrosis: No Has patient had a PCN reaction that required hospitalization: No Has patient had a PCN reaction occurring within the last 10 years: No If all of the above answers are "NO", then may proceed with Cephalosporin use.  . Tramadol Itching   Patient Measurements: Height: 5\' 7"  (170.2 cm) Weight: 155 lb 3.2 oz (70.4 kg) IBW/kg (Calculated) : 61.6 Heparin Dosing Weight: 70.4 kg   Vital Signs: Temp: 99.1 F (37.3 C) (09/02 0848) Temp Source: Oral (09/02 0848) BP: 109/69 (09/02 0848) Pulse Rate: 93 (09/02 0848)  Labs: Recent Labs    12/18/18 0432 12/19/18 1013 12/19/18 1138 12/19/18 1949 12/20/18 0320 12/20/18 1119  HGB 8.7* 8.4*  --   --  7.4*  --   HCT 25.4* 24.9*  --   --  21.8*  --   PLT 229 203  --   --  209  --   APTT  --   --  32  --   --   --   LABPROT  --  14.2  --   --  14.9  --   INR  --  1.1  --   --  1.2  --   HEPARINUNFRC 0.35  --   --  0.16* 0.36 0.38  CREATININE 0.87  --   --   --  0.73  --     Estimated Creatinine Clearance: 70 mL/min (by C-G formula based on SCr of 0.73 mg/dL).   Medical History: Past Medical History:  Diagnosis Date  . Anxiety    h/o  . COPD (chronic obstructive pulmonary disease) ([place])   . Diabetes mellitus without complication ([place])   . GERD (gastroesophageal reflux disease)   . Hypertension    bp under control-off meds since 2019    Medications:  Medications Prior to Admission  Medication Sig Dispense Refill Last Dose  . albuterol (PROVENTIL HFA;VENTOLIN HFA) 108 (90 Base) MCG/ACT inhaler Inhale 2 puffs into the lungs every 6 (six) hours as needed for  wheezing or shortness of breath. 1 Inhaler 2 prn at prn  . alum & mag hydroxide-simeth (MAALOX/MYLANTA) 200-200-20 MG/5ML suspension Take 30 mLs by mouth every 4 (four) hours as needed for indigestion or heartburn. 355 mL 0 prn at prn  . atorvastatin (LIPITOR) 10 MG tablet Take 1 tablet (10 mg total) by mouth daily. 90 tablet 3 12/13/2018 at Unknown time  . Biotin 1000 MCG tablet Take 1,000 tablets by mouth daily.    12/13/2018 at Unknown time  . cetirizine (ZYRTEC) 10 MG tablet Take 10 mg by mouth daily.   12/13/2018 at Unknown time  . cholecalciferol (VITAMIN D3) 25 MCG (1000 UT) tablet Take 1,000 Units by mouth daily.   12/13/2018 at Unknown time  . iron polysaccharides (NIFEREX) 150 MG capsule Take 1 capsule (150 mg total) by mouth daily. 30 capsule 3 12/13/2018 at Unknown time  . Multiple Vitamins-Minerals (WOMENS MULTIVITAMIN PO) Take 1 tablet by mouth daily.   12/13/2018 at Unknown time  . pantoprazole (PROTONIX) 40 MG tablet Take 1 tablet (40 mg total) by mouth daily. 30 tablet 0 12/13/2018 at Unknown time  . sitaGLIPtin (JANUVIA) 100 MG tablet Take 100  mg by mouth daily.   12/13/2018 at Unknown time  . vitamin C (ASCORBIC ACID) 500 MG tablet Take 500 mg by mouth daily.   12/13/2018 at Unknown time  . warfarin (COUMADIN) 5 MG tablet Take 1 tablet (5 mg total) by mouth daily at 6 PM. 30 tablet 5 12/13/2018 at Unknown time    Assessment: History of DVT. The patient was on Coumadin 5 mg daily, off Coumadin for 2 weeks due to recent GI bleeding. Vascular suggested to start heparin and holding coumadin (DVT). Heparin was discontinued yesterday and Aggrastat was started while still on Alteplase. Pharmacy was consulted to start heparin for PAD and to restart warfarin.   Given patient recently had tPA, will need to start heparin infusion (without a bolus) once aPTT<80 seconds. Will continue heparin infusion until INR is closer to goal.    Heparin Course: 9/1:  HL @ 1949 = 0.16 9/2:  HL @ 0320 = 0.36,  therapeutic on 1200 units/hr 9/2:  HL @ 1119 = 0.38, therapeutic x2 on 1200 units/hr. It was noted during rounds, by the nurse, that the patient right foot does not have a pulse and is purple. Of note, there are no concerns for bleeding. Discussed with Dr. Wyn Quaker on whether to continue the heparin infusion d/t hemoglobin.   Warfarin:  Drug interaction with ketorolac may increase the risk of bleeding.   Date INR  Warfarin dose   9/1 1.1  7.5 mg  9/2 1.2                    Today's INR is subtherapeutic as expected. Given recent GI bleed, will conservatively boost warfarin dose.   Goal of Therapy:  INR 2-3 Heparin level 0.3-0.5 for the first 24 hours, then 0.3-0.7 thereafter (starting on 12/20/18 @1400 ) Monitor platelets by anticoagulation protocol: Yes    Plan:  1. Will order warfarin 10 mg tonight x1 dose. Will closely monitor for adverse side effects.  2. Per Dr. , will continue the heparin infusion at 1200 units. Will recheck CBC and and HL with AM labs.   CBC and INR daily.   Wyn Quaker 12/20/2018,12:35 PM

## 2018-12-20 NOTE — Plan of Care (Signed)
Continues on heparin gtt, getting scheduled pain medications for right leg pain.  Problem: Education: Goal: Knowledge of General Education information will improve Description: Including pain rating scale, medication(s)/side effects and non-pharmacologic comfort measures Outcome: Progressing   Problem: Coping: Goal: Ability to adjust to condition or change in health will improve Outcome: Progressing   Problem: Nutritional: Goal: Maintenance of adequate nutrition will improve Outcome: Progressing

## 2018-12-20 NOTE — Progress Notes (Signed)
Dr. Lucky Cowboy update of change today in pts [employer] - pts R posterior tibial pulse is not heard on doppler - was heard yesterday- pt's toe color has changed from yesterday to a mottled purple / foot seems cooler to touch today/ will continue to monitor.

## 2018-12-21 ENCOUNTER — Encounter
Admission: EM | Disposition: A | Discharge status: Home Health | Payer: Self-pay | Source: Home / Self Care | Attending: Internal Medicine

## 2018-12-21 ENCOUNTER — Other Ambulatory Visit (INDEPENDENT_AMBULATORY_CARE_PROVIDER_SITE_OTHER): Payer: Self-pay | Admitting: Vascular Surgery

## 2018-12-21 ENCOUNTER — Encounter: Payer: Self-pay | Admitting: Vascular Surgery

## 2018-12-21 ENCOUNTER — Encounter
Admission: EM | Disposition: A | Discharge status: Home Health | Payer: Medicaid Other | Source: Home / Self Care | Attending: Internal Medicine

## 2018-12-21 DIAGNOSIS — I700 Atherosclerosis of aorta: Secondary | ICD-10-CM

## 2018-12-21 DIAGNOSIS — E0810 Diabetes mellitus due to underlying condition with ketoacidosis without coma: Secondary | ICD-10-CM

## 2018-12-21 DIAGNOSIS — I70223 Atherosclerosis of native arteries of extremities with rest pain, bilateral legs: Secondary | ICD-10-CM

## 2018-12-21 DIAGNOSIS — I70221 Atherosclerosis of native arteries of extremities with rest pain, right leg: Secondary | ICD-10-CM

## 2018-12-21 DIAGNOSIS — I743 Embolism and thrombosis of arteries of the lower extremities: Secondary | ICD-10-CM

## 2018-12-21 HISTORY — PX: LOWER EXTREMITY ANGIOGRAPHY: CATH118251

## 2018-12-21 LAB — CBC
HCT: 21 % — ABNORMAL LOW (ref 36.0–46.0)
HCT: 23.4 % — ABNORMAL LOW (ref 36.0–46.0)
HCT: 21.5 % — ABNORMAL LOW (ref 36.0–46.0)
Hemoglobin: 7.1 g/dL — ABNORMAL LOW (ref 12.0–15.0)
Hemoglobin: 8 g/dL — ABNORMAL LOW (ref 12.0–15.0)
Hemoglobin: 7.5 g/dL — ABNORMAL LOW (ref 12.0–15.0)
MCH: 27.7 pg (ref 26.0–34.0)
MCH: 28.2 pg (ref 26.0–34.0)
MCH: 28.3 pg (ref 26.0–34.0)
MCHC: 33.8 g/dL (ref 30.0–36.0)
MCHC: 34.2 g/dL (ref 30.0–36.0)
MCHC: 34.9 g/dL (ref 30.0–36.0)
MCV: 82 fL (ref 80.0–100.0)
MCV: 82.4 fL (ref 80.0–100.0)
MCV: 81.1 fL (ref 80.0–100.0)
Platelets: 243 10*3/uL (ref 150–400)
Platelets: 225 10*3/uL (ref 150–400)
Platelets: 219 10*3/uL (ref 150–400)
RBC: 2.56 MIL/uL — ABNORMAL LOW (ref 3.87–5.11)
RBC: 2.84 MIL/uL — ABNORMAL LOW (ref 3.87–5.11)
RBC: 2.65 MIL/uL — ABNORMAL LOW (ref 3.87–5.11)
RDW: 14.6 % (ref 11.5–15.5)
RDW: 14.6 % (ref 11.5–15.5)
RDW: 14.6 % (ref 11.5–15.5)
WBC: 9.8 10*3/uL (ref 4.0–10.5)
WBC: 14.4 10*3/uL — ABNORMAL HIGH (ref 4.0–10.5)
WBC: 15.4 10*3/uL — ABNORMAL HIGH (ref 4.0–10.5)
nRBC: 0 % (ref 0.0–0.2)
nRBC: 0 % (ref 0.0–0.2)
nRBC: 0 % (ref 0.0–0.2)

## 2018-12-21 LAB — BASIC METABOLIC PANEL
Anion gap: 6 (ref 5–15)
BUN: 13 mg/dL (ref 8–23)
CO2: 24 mmol/L (ref 22–32)
Calcium: 8.9 mg/dL (ref 8.9–10.3)
Chloride: 111 mmol/L (ref 98–111)
Creatinine, Ser: 0.79 mg/dL (ref 0.44–1.00)
GFR calc Af Amer: >60 mL/min (ref >60)
GFR calc non Af Amer: >60 mL/min (ref >60)
Glucose, Bld: 148 mg/dL — ABNORMAL HIGH (ref 70–99)
Potassium: 3.7 mmol/L (ref 3.5–5.1)
Sodium: 141 mmol/L (ref 135–145)

## 2018-12-21 LAB — HEPARIN LEVEL (UNFRACTIONATED): Heparin Unfractionated: 0.33 IU/mL (ref 0.30–0.70)

## 2018-12-21 LAB — FIBRINOGEN
Fibrinogen: 651 mg/dL — ABNORMAL HIGH (ref 210–475)
Fibrinogen: 272 mg/dL (ref 210–475)

## 2018-12-21 LAB — PROTIME-INR
INR: 1.3 — ABNORMAL HIGH (ref 0.8–1.2)
Prothrombin Time: 15.8 seconds — ABNORMAL HIGH (ref 11.4–15.2)

## 2018-12-21 LAB — GLUCOSE, CAPILLARY
Glucose-Capillary: 136 mg/dL — ABNORMAL HIGH (ref 70–99)
Glucose-Capillary: 156 mg/dL — ABNORMAL HIGH (ref 70–99)
Glucose-Capillary: 144 mg/dL — ABNORMAL HIGH (ref 70–99)
Glucose-Capillary: 199 mg/dL — ABNORMAL HIGH (ref 70–99)

## 2018-12-21 LAB — ABO/RH: ABO/RH(D): O POS

## 2018-12-21 LAB — PREPARE RBC (CROSSMATCH)

## 2018-12-21 SURGERY — LOWER EXTREMITY ANGIOGRAPHY
Anesthesia: Moderate Sedation | Laterality: Left

## 2018-12-21 SURGERY — LOWER EXTREMITY ANGIOGRAPHY
Anesthesia: Moderate Sedation | Laterality: Right

## 2018-12-21 MED ORDER — CLINDAMYCIN PHOSPHATE 300 MG/50ML IV SOLN
300.0000 mg | Freq: Once | INTRAVENOUS | Status: AC
  Administered 2018-12-21: 300 mg via INTRAVENOUS
  Filled 2018-12-21: qty 50

## 2018-12-21 MED ORDER — MIDAZOLAM HCL 2 MG/2ML IJ SOLN
INTRAMUSCULAR | Status: DC | PRN
  Administered 2018-12-21: 1 mg via INTRAVENOUS
  Administered 2018-12-21: 2 mg via INTRAVENOUS
  Administered 2018-12-21: 1 mg via INTRAVENOUS
  Administered 2018-12-21: 2 mg via INTRAVENOUS
  Administered 2018-12-21: 1 mg via INTRAVENOUS

## 2018-12-21 MED ORDER — ALPRAZOLAM 0.25 MG PO TABS
0.2500 mg | ORAL_TABLET | Freq: Three times a day (TID) | ORAL | Status: DC | PRN
  Administered 2018-12-21 – 2018-12-31 (×9): 0.25 mg via ORAL
  Filled 2018-12-21 (×9): qty 1

## 2018-12-21 MED ORDER — POTASSIUM CHLORIDE CRYS ER 20 MEQ PO TBCR
20.0000 meq | EXTENDED_RELEASE_TABLET | Freq: Once | ORAL | Status: AC
  Administered 2018-12-21: 20 meq via ORAL
  Filled 2018-12-21: qty 1

## 2018-12-21 MED ORDER — SODIUM CHLORIDE 0.9 % IV SOLN: 250.0000 mL | INTRAVENOUS | Status: DC | PRN

## 2018-12-21 MED ORDER — MIDAZOLAM HCL 2 MG/2ML IJ SOLN
INTRAMUSCULAR | Status: AC
  Filled 2018-12-21: qty 2

## 2018-12-21 MED ORDER — HEPARIN SODIUM (PORCINE) 1000 UNIT/ML IJ SOLN
INTRAMUSCULAR | Status: AC
  Filled 2018-12-21: qty 1

## 2018-12-21 MED ORDER — WARFARIN SODIUM 10 MG PO TABS
10.0000 mg | ORAL_TABLET | Freq: Once | ORAL | Status: DC
  Filled 2018-12-21: qty 1

## 2018-12-21 MED ORDER — DIPHENHYDRAMINE HCL 50 MG/ML IJ SOLN: 50.0000 mg | Freq: Once | INTRAMUSCULAR | Status: DC | PRN

## 2018-12-21 MED ORDER — SODIUM CHLORIDE 0.9% IV SOLUTION: Freq: Once | INTRAVENOUS | Status: DC

## 2018-12-21 MED ORDER — ALTEPLASE 2 MG IJ SOLR
INTRAMUSCULAR | Status: AC
  Filled 2018-12-21: qty 8

## 2018-12-21 MED ORDER — ONDANSETRON HCL 4 MG/2ML IJ SOLN
4.0000 mg | Freq: Four times a day (QID) | INTRAMUSCULAR | Status: DC | PRN
  Administered 2018-12-21: 4 mg via INTRAVENOUS
  Filled 2018-12-21 (×2): qty 2

## 2018-12-21 MED ORDER — LEVOFLOXACIN IN D5W 750 MG/150ML IV SOLN
750.0000 mg | Freq: Every day | INTRAVENOUS | Status: DC
  Administered 2018-12-21: 750 mg via INTRAVENOUS
  Filled 2018-12-21 (×3): qty 150

## 2018-12-21 MED ORDER — FENTANYL CITRATE (PF) 100 MCG/2ML IJ SOLN
INTRAMUSCULAR | Status: AC
  Filled 2018-12-21: qty 4

## 2018-12-21 MED ORDER — METHYLPREDNISOLONE SODIUM SUCC 125 MG IJ SOLR: 125.0000 mg | Freq: Once | INTRAMUSCULAR | Status: DC | PRN

## 2018-12-21 MED ORDER — SODIUM CHLORIDE 0.9 % IV SOLN
0.5000 mg/h | INTRAVENOUS | Status: DC
  Administered 2018-12-22: 0.5 mg/h
  Filled 2018-12-21 (×4): qty 10

## 2018-12-21 MED ORDER — HYDROMORPHONE HCL 1 MG/ML IJ SOLN
1.0000 mg | INTRAMUSCULAR | Status: DC | PRN
  Administered 2018-12-21 – 2018-12-22 (×4): 1 mg via INTRAVENOUS
  Filled 2018-12-21 (×3): qty 1

## 2018-12-21 MED ORDER — DOCUSATE SODIUM 100 MG PO CAPS
100.0000 mg | ORAL_CAPSULE | Freq: Two times a day (BID) | ORAL | Status: DC
  Administered 2018-12-21 – 2019-01-05 (×16): 100 mg via ORAL
  Filled 2018-12-21 (×23): qty 1

## 2018-12-21 MED ORDER — SODIUM CHLORIDE 0.9 % IV SOLN: INTRAVENOUS | Status: DC

## 2018-12-21 MED ORDER — HYDROMORPHONE HCL 1 MG/ML IJ SOLN
1.0000 mg | Freq: Once | INTRAMUSCULAR | Status: AC | PRN
  Administered 2018-12-21: 1 mg via INTRAVENOUS

## 2018-12-21 MED ORDER — MIDAZOLAM HCL 5 MG/5ML IJ SOLN
INTRAMUSCULAR | Status: AC
  Filled 2018-12-21: qty 5

## 2018-12-21 MED ORDER — SODIUM CHLORIDE 0.9 % IV SOLN
1.0000 mg/h | INTRAVENOUS | Status: DC
  Administered 2018-12-21: 1 mg/h
  Filled 2018-12-21: qty 10

## 2018-12-21 MED ORDER — CHLORHEXIDINE GLUCONATE CLOTH 2 % EX PADS
6.0000 | MEDICATED_PAD | Freq: Every day | CUTANEOUS | Status: DC
  Administered 2018-12-21 – 2018-12-25 (×2): 6 via TOPICAL

## 2018-12-21 MED ORDER — IODIXANOL 320 MG/ML IV SOLN
INTRAVENOUS | Status: DC | PRN
  Administered 2018-12-21: 10 mL

## 2018-12-21 MED ORDER — HYDROMORPHONE HCL 1 MG/ML IJ SOLN
INTRAMUSCULAR | Status: AC
  Filled 2018-12-21: qty 1

## 2018-12-21 MED ORDER — ALTEPLASE 2 MG IJ SOLR
INTRAMUSCULAR | Status: DC | PRN
  Administered 2018-12-21 (×2): 4 mg

## 2018-12-21 MED ORDER — SODIUM CHLORIDE 0.9 % IV SOLN
INTRAVENOUS | Status: DC
  Administered 2018-12-21: 20:00:00 via INTRAVENOUS

## 2018-12-21 MED ORDER — FENTANYL CITRATE (PF) 100 MCG/2ML IJ SOLN
INTRAMUSCULAR | Status: AC
  Filled 2018-12-21: qty 2

## 2018-12-21 MED ORDER — MIDAZOLAM HCL 2 MG/2ML IJ SOLN
INTRAMUSCULAR | Status: DC | PRN
  Administered 2018-12-21: 1 mg via INTRAVENOUS

## 2018-12-21 MED ORDER — SENNA 8.6 MG PO TABS
2.0000 | ORAL_TABLET | Freq: Every day | ORAL | Status: DC
  Administered 2018-12-21 – 2019-01-05 (×10): 17.2 mg via ORAL
  Filled 2018-12-21 (×12): qty 2

## 2018-12-21 MED ORDER — HYDRALAZINE HCL 20 MG/ML IJ SOLN
10.0000 mg | INTRAMUSCULAR | Status: DC | PRN
  Administered 2018-12-21: 20 mg via INTRAVENOUS
  Administered 2018-12-21: 10 mg via INTRAVENOUS
  Administered 2018-12-26 – 2018-12-28 (×4): 20 mg via INTRAVENOUS
  Administered 2018-12-29 (×2): 10 mg via INTRAVENOUS
  Administered 2018-12-30: 20 mg via INTRAVENOUS
  Filled 2018-12-21 (×9): qty 1

## 2018-12-21 MED ORDER — CLINDAMYCIN PHOSPHATE 300 MG/50ML IV SOLN
INTRAVENOUS | Status: AC
  Administered 2018-12-22: 300 mg via INTRAVENOUS
  Filled 2018-12-21: qty 50

## 2018-12-21 MED ORDER — SODIUM CHLORIDE 0.9% FLUSH: 3.0000 mL | INTRAVENOUS | Status: DC | PRN

## 2018-12-21 MED ORDER — FAMOTIDINE 20 MG PO TABS: 40.0000 mg | ORAL_TABLET | Freq: Once | ORAL | Status: DC | PRN

## 2018-12-21 MED ORDER — HYDRALAZINE HCL 20 MG/ML IJ SOLN
10.0000 mg | Freq: Once | INTRAMUSCULAR | Status: AC
  Administered 2018-12-21: 10 mg via INTRAVENOUS
  Filled 2018-12-21: qty 1

## 2018-12-21 MED ORDER — IODIXANOL 320 MG/ML IV SOLN
INTRAVENOUS | Status: DC | PRN
  Administered 2018-12-21: 110 mL via INTRA_ARTERIAL

## 2018-12-21 MED ORDER — SODIUM CHLORIDE 0.9% FLUSH: 3.0000 mL | Freq: Two times a day (BID) | INTRAVENOUS | Status: DC

## 2018-12-21 MED ORDER — MIDAZOLAM HCL 2 MG/2ML IJ SOLN
INTRAMUSCULAR | Status: AC
  Filled 2018-12-21: qty 4

## 2018-12-21 MED ORDER — FENTANYL CITRATE (PF) 100 MCG/2ML IJ SOLN
INTRAMUSCULAR | Status: DC | PRN
  Administered 2018-12-21 (×6): 50 ug via INTRAVENOUS

## 2018-12-21 MED ORDER — FENTANYL CITRATE (PF) 100 MCG/2ML IJ SOLN
INTRAMUSCULAR | Status: DC | PRN
  Administered 2018-12-21: 25 ug via INTRAVENOUS

## 2018-12-21 MED ORDER — MORPHINE SULFATE (PF) 2 MG/ML IV SOLN
INTRAVENOUS | Status: AC
  Filled 2018-12-21: qty 1

## 2018-12-21 MED ORDER — HYDROMORPHONE HCL 1 MG/ML IJ SOLN
1.0000 mg | INTRAMUSCULAR | Status: AC
  Administered 2018-12-21: 1 mg via INTRAVENOUS
  Filled 2018-12-21: qty 1

## 2018-12-21 MED ORDER — OXYCODONE HCL 5 MG PO TABS
ORAL_TABLET | ORAL | Status: AC
  Filled 2018-12-21: qty 2

## 2018-12-21 MED ORDER — MIDAZOLAM HCL 2 MG/ML PO SYRP: 8.0000 mg | ORAL_SOLUTION | Freq: Once | ORAL | Status: DC | PRN

## 2018-12-21 SURGICAL SUPPLY — 25 items
BALLN LUTONIX 018 4X300X130 (BALLOONS) ×2
BALLN LUTONIX 018 5X100X130 (BALLOONS) ×2
BALLOON LUTONIX 018 4X300X130 (BALLOONS) ×1 IMPLANT
BALLOON LUTONIX 018 5X100X130 (BALLOONS) ×1 IMPLANT
CANISTER PENUMBRA ENGINE (MISCELLANEOUS) ×2 IMPLANT
CATH ANGIO 5F PIGTAIL 65CM (CATHETERS) ×2 IMPLANT
CATH BEACON 5 .035 65 RIM TIP (CATHETERS) ×2 IMPLANT
CATH BEACON 5 .038 100 VERT TP (CATHETERS) ×2 IMPLANT
CATH INDIGO CAT6 KIT (CATHETERS) ×2 IMPLANT
CATH INFUS 90CMX50CM (CATHETERS) ×1
CATH INFUS UNIFUSE 90X50 5FR (CATHETERS) ×1 IMPLANT
COVER PROBE U/S 5X48 (MISCELLANEOUS) ×2 IMPLANT
DEVICE PRESTO INFLATION (MISCELLANEOUS) ×2 IMPLANT
DEVICE STARCLOSE SE CLOSURE (Vascular Products) ×2 IMPLANT
GLIDEWIRE ADV .035X260CM (WIRE) ×2 IMPLANT
PACK ANGIOGRAPHY (CUSTOM PROCEDURE TRAY) ×2 IMPLANT
SHEATH BRITE TIP 5FRX11 (SHEATH) ×2 IMPLANT
SHEATH BRITE TIP 6FR X 23 (SHEATH) ×2 IMPLANT
SHEATH PINNACLE ST 6F 45CM (SHEATH) ×2 IMPLANT
STENT VIABAHN 6X100X120 (Permanent Stent) ×2 IMPLANT
STENT VIABAHN 6X250X120 (Permanent Stent) ×2 IMPLANT
SYR MEDRAD MARK 7 150ML (SYRINGE) ×2 IMPLANT
TUBING CONTRAST HIGH PRESS 72 (TUBING) ×2 IMPLANT
WIRE G V18X300CM (WIRE) ×2 IMPLANT
WIRE J 3MM .035X145CM (WIRE) ×2 IMPLANT

## 2018-12-21 SURGICAL SUPPLY — 5 items
CATH INFUS 135CMX50CM (CATHETERS) ×2 IMPLANT
PACK ANGIOGRAPHY (CUSTOM PROCEDURE TRAY) ×2 IMPLANT
SHEATH PINNACLE ST 6F 45CM (SHEATH) ×2 IMPLANT
SUT PROLENE 0 CT 1 30 (SUTURE) ×2 IMPLANT
WIRE MAGIC TORQUE 260C (WIRE) ×2 IMPLANT

## 2018-12-21 NOTE — Progress Notes (Addendum)
ANTICOAGULATION CONSULT NOTE   Pharmacy Consult for  Heparin and Warfarin Indication: PAD  Allergies  Allergen Reactions  . Penicillins Hives    Has patient had a PCN reaction causing immediate rash, facial/tongue/throat swelling, SOB or lightheadedness with hypotension: Yes Has patient had a PCN reaction causing severe rash involving mucus membranes or skin necrosis: No Has patient had a PCN reaction that required hospitalization: No Has patient had a PCN reaction occurring within the last 10 years: No If all of the above answers are "NO", then may proceed with Cephalosporin use.  . Tramadol Itching   Patient Measurements: Height: 5\' 7"  (170.2 cm) Weight: 152 lb 12.8 oz (69.3 kg) IBW/kg (Calculated) : 61.6 Heparin Dosing Weight: 70.4 kg   Vital Signs: Temp: 98.9 F (37.2 C) (09/03 0719) Temp Source: Oral (09/03 0719) BP: 134/68 (09/03 0719) Pulse Rate: 85 (09/03 0719)  Labs: Recent Labs    12/19/18 1013 12/19/18 1138  12/20/18 0320 12/20/18 1119 12/21/18 0448  HGB 8.4*  --   --  7.4*  --  7.1*  HCT 24.9*  --   --  21.8*  --  21.0*  PLT 203  --   --  209  --  243  APTT  --  32  --   --   --   --   LABPROT 14.2  --   --  14.9  --  15.8*  INR 1.1  --   --  1.2  --  1.3*  HEPARINUNFRC  --   --    < > 0.36 0.38 0.33  CREATININE  --   --   --  0.73  --  0.79   < > = values in this interval not displayed.    Estimated Creatinine Clearance: 70 mL/min (by C-G formula based on SCr of 0.79 mg/dL).   Medical History: Past Medical History:  Diagnosis Date  . Anxiety    h/o  . COPD (chronic obstructive pulmonary disease) (HCC)   . Diabetes mellitus without complication (HCC)   . GERD (gastroesophageal reflux disease)   . Hypertension    bp under control-off meds since 2019    Medications:  Medications Prior to Admission  Medication Sig Dispense Refill Last Dose  . albuterol (PROVENTIL HFA;VENTOLIN HFA) 108 (90 Base) MCG/ACT inhaler Inhale 2 puffs into the lungs  every 6 (six) hours as needed for wheezing or shortness of breath. 1 Inhaler 2 prn at prn  . alum & mag hydroxide-simeth (MAALOX/MYLANTA) 200-200-20 MG/5ML suspension Take 30 mLs by mouth every 4 (four) hours as needed for indigestion or heartburn. 355 mL 0 prn at prn  . atorvastatin (LIPITOR) 10 MG tablet Take 1 tablet (10 mg total) by mouth daily. 90 tablet 3 12/13/2018 at Unknown time  . Biotin 1000 MCG tablet Take 1,000 tablets by mouth daily.    12/13/2018 at Unknown time  . cetirizine (ZYRTEC) 10 MG tablet Take 10 mg by mouth daily.   12/13/2018 at Unknown time  . cholecalciferol (VITAMIN D3) 25 MCG (1000 UT) tablet Take 1,000 Units by mouth daily.   12/13/2018 at Unknown time  . iron polysaccharides (NIFEREX) 150 MG capsule Take 1 capsule (150 mg total) by mouth daily. 30 capsule 3 12/13/2018 at Unknown time  . Multiple Vitamins-Minerals (WOMENS MULTIVITAMIN PO) Take 1 tablet by mouth daily.   12/13/2018 at Unknown time  . pantoprazole (PROTONIX) 40 MG tablet Take 1 tablet (40 mg total) by mouth daily. 30 tablet 0 12/13/2018 at Unknown time  .  sitaGLIPtin (JANUVIA) 100 MG tablet Take 100 mg by mouth daily.   12/13/2018 at Unknown time  . vitamin C (ASCORBIC ACID) 500 MG tablet Take 500 mg by mouth daily.   12/13/2018 at Unknown time  . warfarin (COUMADIN) 5 MG tablet Take 1 tablet (5 mg total) by mouth daily at 6 PM. 30 tablet 5 12/13/2018 at Unknown time    Assessment: History of DVT. The patient was on Coumadin 5 mg daily, off Coumadin for 2 weeks due to recent GI bleeding. Vascular suggested to start heparin and holding coumadin (DVT). Heparin was discontinued yesterday and Aggrastat was started while still on Alteplase. Pharmacy was consulted to start heparin for PAD and to restart warfarin.   Given patient recently had tPA, will need to start heparin infusion (without a bolus) once aPTT<80 seconds. Will continue heparin infusion until INR is closer to goal.    Heparin Course: 9/1:  HL @ 1949  = 0.16 9/2:  HL @ 0320 = 0.36, therapeutic on 1200 units/hr 9/2:  HL @ 1119 = 0.38, therapeutic x2 on 1200 units/hr. It was noted during rounds, by the nurse, that the patient right foot does not have a pulse and is purple. Of note, there are no concerns for bleeding. Discussed with Dr. Lucky Cowboy on whether to continue the heparin infusion d/t hemoglobin. 9/3 @ 0448 HL = 0.33, therapeutic x 3, continue heparin at current rate   Warfarin:  Drug interaction with ketorolac may increase the risk of bleeding.   Date INR  Warfarin dose   9/1 1.1  7.5 mg  9/2 1.2 10 mg  9/3 1.3                Today's INR is subtherapeutic as expected. Given recent GI bleed, will conservatively boost warfarin dose. Hemoglobin remains low (lower than yesterday), nurse stated there were no concerns of unusual bleeding.   Goal of Therapy:  INR 2-3 Heparin level 0.3-0.7 thereafter  Monitor platelets by anticoagulation protocol: Yes    Plan:  1. Will order warfarin 10 mg tonight x1 dose. Will closely monitor for bleeding events. Will follow up with attending/ vascular today on anticoagulation as it relates to hemoglobin. CBC and INR daily.   2. Will continue the heparin infusion at 1200 units. Will recheck CBC and and HL with AM labs.    ADDENDUM: heparin has been discontinued    Roczen Waymire R Stein Windhorst 12/21/2018,7:53 AM

## 2018-12-21 NOTE — Plan of Care (Signed)
Still getting scheduled pain medications for bilateral leg pain, heparin and normal saline infusing  Problem: Education: Goal: Knowledge of General Education information will improve Description: Including pain rating scale, medication(s)/side effects and non-pharmacologic comfort measures Outcome: Progressing   Problem: Coping: Goal: Ability to adjust to condition or change in health will improve Outcome: Progressing

## 2018-12-21 NOTE — Consult Note (Addendum)
Name: Sylvia [name] MRN: [id number] DOB: Apr 17, 1956    ADMISSION DATE:  12/14/2018 CONSULTATION DATE: 12/21/2018  REFERRING MD : Dr. Lucky Cowboy   CHIEF COMPLAINT: Abdominal Pain   BRIEF PATIENT DESCRIPTION:  63 yo female admitted to the medsurg unit with mild DKA requiring insulin gtt briefly and severe PAD with bilateral claudication (pt noncompliant with outpatient medication) requiring RLE angiogram x2 in an attempt at limb salvage pt transferred to ICU following 2nd RLE angiogram for thrombolytic therapy overnight   SIGNIFICANT EVENTS/STUDIES:  08/27-CT Abd Pelvis revealed extensive aortoiliac atherosclerosis. Extensive plaque in particular is noted in the aorta with subtotal occlusion in the mid aorta at the level of the lower poles of the kidneys. Most small bowel loops are fluid-filled. This is a finding that may be indicative of a degree of ileus or enteritis. No bowel obstruction evident. There are diverticula arising from the cecum without diverticulitis. No diverticulitis seen elsewhere. No abscess in the abdomen or pelvis. Appendix appears normal. No renal or ureteral calculi. No hydronephrosis. Urinary bladder wall thickness within normal limits. Hepatic steatosis. Gallbladder absent. Uterus absent. 08/28-Pt admitted to the [facility] unit with mild DKA, insulin gtt started briefly, however pt transitioned to SSI and long acting insulin a few hours after gtt initiation  08/28-US Venous Bilateral Lower Extremity revealed no evidence of DVT within either lower extremity. 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 Correlation for symptoms of PAD is advised. Further evaluation with CTA run-off could be as indicated 08/28-Pt has hx of PAD developed bilateral claudication prior to hospital presentation, vascular surgery consulted recommended initiation of heparin gtt and right lower extremity angiogram with possible intervention scheduled  for 08/31 (coumadin stopped in outpatient setting due to pt having dark stools concerning for possible GI bleed, pt advised to bring stool to vascular clinic for occult test, however she never followed up) 08/31-Pt underwent right lower extremity endovascular intervention requiring lysis catheter with TPA infusion and take back to the endovascular suite for continued intervention.  At conclusion of the procedure, successful revascularization of the right lower extremity. 09/1-Pt experienced worsening right lower extremity pain that started the night of 08/31 that continued 09/1, per vascular surgery suspected reperfusion syndrome  09/2-Pt continued to have right lower extremity pain as well as numbness with several mottled toes requiring right lower extremity angiogram.  Angiogram revealed continued thrombosis of the right SFA stent, popliteal stent, and thrombus remaining in the tibioperoneal trunk and posterior tibial arteries.  Per vascular surgery pt admitted to ICU for thrombolytic therapy overnight with plan for repeat right lower extremity angiogram on 09/3.  She also underwent selective left lower extremity angiogram with mechanical thrombectomy to the left SFA, popliteal artery, and tibioperoneal trunk with the penumbra cat 6 device  HISTORY OF PRESENT ILLNESS:   This is a 63 yo female with a PMH of HTN, GERD, Uncontrolled Type II Diabetes Mellitus, COPD, Severe PAD, and Anxiety.  She presented to [facility] ER on 08/27 with c/o epigastric abdominal pain, vomiting, and diarrhea onset of symptoms the morning of 08/27. Lab results ruled pt in mild DKA and insulin gtt started briefly.  COVID-19 negative.  Vascular surgery consulted due to pt hx of severe PAD with bilateral claudication interfering with ADL's in outpatient setting.  Pt admitted to the [facility] unit by hospitalist team for additional workup and treatment.  Detailed hospital course described under significant events listed above.    PAST MEDICAL  HISTORY :   has a  past medical history of Anxiety, COPD (chronic obstructive pulmonary disease) (HCC), Diabetes mellitus without complication (HCC), GERD (gastroesophageal reflux disease), and Hypertension.  has a past surgical history that includes Abdominal hysterectomy; Cholecystectomy; Esophagogastroduodenoscopy (egd) with propofol (N/A, 06/15/2018); Lower Extremity Angiography (Right, 08/21/2018); Lower Extremity Angiography (Left, 08/28/2018); Lower Extremity Angiography (Right, 08/28/2018); Lower Extremity Angiography (Right, 12/18/2018); and Lower Extremity Angiography (Right, 12/18/2018). Prior to Admission medications   Medication Sig Start Date End Date Taking? Authorizing Provider  albuterol (PROVENTIL HFA;VENTOLIN HFA) 108 (90 Base) MCG/ACT inhaler Inhale 2 puffs into the lungs every 6 (six) hours as needed for wheezing or shortness of breath. 08/20/17  Yes Jeanmarie Plant, MD  alum & mag hydroxide-simeth (MAALOX/MYLANTA) 200-200-20 MG/5ML suspension Take 30 mLs by mouth every 4 (four) hours as needed for indigestion or heartburn. 06/16/18  Yes Ojie, Jude, MD  atorvastatin (LIPITOR) 10 MG tablet Take 1 tablet (10 mg total) by mouth daily. 08/21/18 08/21/19 Yes Stegmayer, Ranae Plumber, PA-C  Biotin 1000 MCG tablet Take 1,000 tablets by mouth daily.    Yes [provider]  cetirizine (ZYRTEC) 10 MG tablet Take 10 mg by mouth daily.   Yes [provider]  cholecalciferol (VITAMIN D3) 25 MCG (1000 UT) tablet Take 1,000 Units by mouth daily.   Yes [provider]  iron polysaccharides (NIFEREX) 150 MG capsule Take 1 capsule (150 mg total) by mouth daily. 08/30/18  Yes Dew, Marlow Baars, MD  Multiple Vitamins-Minerals (WOMENS MULTIVITAMIN PO) Take 1 tablet by mouth daily.   Yes [provider]  pantoprazole (PROTONIX) 40 MG tablet Take 1 tablet (40 mg total) by mouth daily. 06/17/18  Yes Ojie, Jude, MD  sitaGLIPtin (JANUVIA) 100 MG tablet Take 100 mg by mouth daily.   Yes [provider]  vitamin C (ASCORBIC ACID) 500 MG tablet Take 500 mg by mouth daily.   Yes [provider]  warfarin (COUMADIN) 5 MG tablet Take 1 tablet (5 mg total) by mouth daily at 6 PM. 10/09/18 04/07/19 Yes Georgiana Spinner, NP   Allergies  Allergen Reactions  . Penicillins Hives    Has patient had a PCN reaction causing immediate rash, facial/tongue/throat swelling, SOB or lightheadedness with hypotension: Yes Has patient had a PCN reaction causing severe rash involving mucus membranes or skin necrosis: No Has patient had a PCN reaction that required hospitalization: No Has patient had a PCN reaction occurring within the last 10 years: No If all of the above answers are "NO", then may proceed with Cephalosporin use.  . Tramadol Itching    FAMILY HISTORY:  family history includes Breast cancer (age of onset: 21) in her mother. SOCIAL HISTORY:  reports that she quit smoking about 3 months ago. Her smoking use included cigarettes. She has a 11.25 pack-year smoking history. She has never used smokeless tobacco. She reports that she does not drink alcohol or use drugs.  REVIEW OF SYSTEMS: Positives in BOLD  Constitutional: Negative for fever, chills, weight loss, malaise/fatigue and diaphoresis.  HENT: Negative for hearing loss, ear pain, nosebleeds, congestion, sore throat, neck pain, tinnitus and ear discharge.   Eyes: Negative for blurred vision, double vision, photophobia, pain, discharge and redness.  Respiratory: Negative for cough, hemoptysis, sputum production, shortness of breath, wheezing and stridor.   Cardiovascular: Negative for chest pain, palpitations, orthopnea, claudication, leg swelling and PND.  Gastrointestinal: heartburn, nausea, vomiting, abdominal pain, diarrhea, constipation, blood in stool and melena.  Genitourinary: Negative for dysuria, urgency, frequency, hematuria and flank pain.  Musculoskeletal: right lower extremity pain, myalgias, back pain, joint  pain and falls.  Skin: Negative for itching and rash.  Neurological: Negative for dizziness, tingling, tremors, sensory change, speech change, focal weakness, seizures, loss of consciousness, weakness and headaches.  Endo/Heme/Allergies: Negative for environmental allergies and polydipsia. Does not bruise/bleed easily.  SUBJECTIVE:  C/o nausea and right lower extremity pain  VITAL SIGNS: Temp:  [98.9 F (37.2 C)-99.6 F (37.6 C)] 98.9 F (37.2 C) (09/03 0719) Pulse Rate:  [81-100] 100 (09/03 1300) Resp:  [14-22] 14 (09/03 1300) BP: (101-200)/(58-101) 196/82 (09/03 1300) SpO2:  [95 %-100 %] 95 % (09/03 1300) Weight:  [69.3 kg] 69.3 kg (09/03 0453)  PHYSICAL EXAMINATION: General: well developed, well nourished female, NAD  Neuro: alert and oriented, follows commands  HEENT: supple, no JVD  Cardiovascular: nsr, rrr, no R/G, 1+ doppler bilateral distal pulses Lungs: clear throughout, even, non labored  Abdomen: +BS x4, soft, non tender, non distended  Musculoskeletal: 2+ LUE chronic edema, 1+ bilateral lower extremity edema  Skin: right foot cool to touch  Recent Labs  Lab 12/18/18 0432 12/20/18 0320 12/21/18 0448  NA 142 141 141  K 3.3* 3.7 3.7  CL 112* 109 111  CO2 23 24 24   BUN 9 11 13   CREATININE 0.87 0.73 0.79  GLUCOSE 126* 120* 148*   Recent Labs  Lab 12/20/18 0320 12/21/18 0448 12/21/18 1221  HGB 7.4* 7.1* 8.0*  HCT 21.8* 21.0* 23.4*  WBC 10.6* 9.8 14.4*  PLT 209 243 225   No results found.  ASSESSMENT / PLAN:  Severe PAD with bilateral rest pain s/p left lower extremity angiogram and multiple right lower extremity angiograms  Hypertension  Prn morphine, toradol, and oxycodone for pain management Vascular surgery consulted appreciate input-pt to undergo thrombolytic therapy overnight 09/3 with return to special procedures on 09/4 for 3rd RLE angiogram in an attempt at limb salvage  Prn hydralazine for bp management Continuous telemetry monitoring    Leukocytosis  Trend WBC and monitor fever curve   Acute blood loss anemia  Continue thrombolytic therapy per vascular surgery recommendations  Trend CBC, coags, and fibrinogen levels Monitor for s/sx of bleeding Transfuse for hgb <7 and/or active signs of bleeding   Uncontrolled Diabetes Mellitus  CBG's ac/hs Continue scheduled tradjenta and SSI   Sonda Rumble, AGNP  Pulmonary/Critical Care Pager [phone] (please enter 7 digits) PCCM Consult Pager [phone] (please enter 7 digits)

## 2018-12-21 NOTE — Progress Notes (Signed)
Placed during procedure

## 2018-12-21 NOTE — Progress Notes (Signed)
ANTICOAGULATION CONSULT NOTE   Pharmacy Consult for Warfarin Indication: PAD  Allergies  Allergen Reactions  . Penicillins Hives    Has patient had a PCN reaction causing immediate rash, facial/tongue/throat swelling, SOB or lightheadedness with hypotension: Yes Has patient had a PCN reaction causing severe rash involving mucus membranes or skin necrosis: No Has patient had a PCN reaction that required hospitalization: No Has patient had a PCN reaction occurring within the last 10 years: No If all of the above answers are "NO", then may proceed with Cephalosporin use.  . Tramadol Itching   Patient Measurements: Height: 5\' 7"  (170.2 cm) Weight: 152 lb 12.8 oz (69.3 kg) IBW/kg (Calculated) : 61.6 Heparin Dosing Weight: 70.4 kg   Vital Signs: Temp: 98.9 F (37.2 C) (09/03 0719) Temp Source: Oral (09/03 0719) BP: 196/82 (09/03 1300) Pulse Rate: 100 (09/03 1300)  Labs: Recent Labs    12/19/18 1013 12/19/18 1138  12/20/18 0320 12/20/18 1119 12/21/18 0448 12/21/18 1221  HGB 8.4*  --   --  7.4*  --  7.1* 8.0*  HCT 24.9*  --   --  21.8*  --  21.0* 23.4*  PLT 203  --   --  209  --  243 225  APTT  --  32  --   --   --   --   --   LABPROT 14.2  --   --  14.9  --  15.8*  --   INR 1.1  --   --  1.2  --  1.3*  --   HEPARINUNFRC  --   --    < > 0.36 0.38 0.33  --   CREATININE  --   --   --  0.73  --  0.79  --    < > = values in this interval not displayed.    Estimated Creatinine Clearance: 70 mL/min (by C-G formula based on SCr of 0.79 mg/dL).   Medical History: Past Medical History:  Diagnosis Date  . Anxiety    h/o  . COPD (chronic obstructive pulmonary disease) (HCC)   . Diabetes mellitus without complication (HCC)   . GERD (gastroesophageal reflux disease)   . Hypertension    bp under control-off meds since 2019    Medications:  Medications Prior to Admission  Medication Sig Dispense Refill Last Dose  . albuterol (PROVENTIL HFA;VENTOLIN HFA) 108 (90 Base)  MCG/ACT inhaler Inhale 2 puffs into the lungs every 6 (six) hours as needed for wheezing or shortness of breath. 1 Inhaler 2 prn at prn  . alum & mag hydroxide-simeth (MAALOX/MYLANTA) 200-200-20 MG/5ML suspension Take 30 mLs by mouth every 4 (four) hours as needed for indigestion or heartburn. 355 mL 0 prn at prn  . atorvastatin (LIPITOR) 10 MG tablet Take 1 tablet (10 mg total) by mouth daily. 90 tablet 3 12/13/2018 at Unknown time  . Biotin 1000 MCG tablet Take 1,000 tablets by mouth daily.    12/13/2018 at Unknown time  . cetirizine (ZYRTEC) 10 MG tablet Take 10 mg by mouth daily.   12/13/2018 at Unknown time  . cholecalciferol (VITAMIN D3) 25 MCG (1000 UT) tablet Take 1,000 Units by mouth daily.   12/13/2018 at Unknown time  . iron polysaccharides (NIFEREX) 150 MG capsule Take 1 capsule (150 mg total) by mouth daily. 30 capsule 3 12/13/2018 at Unknown time  . Multiple Vitamins-Minerals (WOMENS MULTIVITAMIN PO) Take 1 tablet by mouth daily.   12/13/2018 at Unknown time  . pantoprazole (PROTONIX) 40 MG  tablet Take 1 tablet (40 mg total) by mouth daily. 30 tablet 0 12/13/2018 at Unknown time  . sitaGLIPtin (JANUVIA) 100 MG tablet Take 100 mg by mouth daily.   12/13/2018 at Unknown time  . vitamin C (ASCORBIC ACID) 500 MG tablet Take 500 mg by mouth daily.   12/13/2018 at Unknown time  . warfarin (COUMADIN) 5 MG tablet Take 1 tablet (5 mg total) by mouth daily at 6 PM. 30 tablet 5 12/13/2018 at Unknown time    Assessment: History of DVT. The patient was on Coumadin 5 mg daily, off Coumadin for 2 weeks due to recent GI bleeding. Vascular suggested to start heparin and holding coumadin (DVT). Heparin was discontinued yesterday and Aggrastat was started while still on Alteplase. Pharmacy was consulted to start heparin for PAD and to restart warfarin.   Given patient recently had tPA, will need to start heparin infusion (without a bolus) once aPTT<80 seconds. Will continue heparin infusion until INR is closer  to goal.    Heparin Course: 9/1:  HL @ 1949 = 0.16 9/2:  HL @ 0320 = 0.36, therapeutic on 1200 units/hr 9/2:  HL @ 1119 = 0.38, therapeutic x2 on 1200 units/hr. It was noted during rounds, by the nurse, that the patient right foot does not have a pulse and is purple. Of note, there are no concerns for bleeding. Discussed with Dr. Lucky Cowboy on whether to continue the heparin infusion d/t hemoglobin. 9/3 @ 0448 HL = 0.33, therapeutic x 3, continue heparin at current rate   Warfarin:  Drug interaction with ketorolac may increase the risk of bleeding.   Date INR  Warfarin dose   9/1 1.1  7.5 mg  9/2 1.2 10 mg  9/3 1.3                Today's INR is subtherapeutic as expected. Given recent GI bleed, will conservatively boost warfarin dose. Hemoglobin remains low (lower than yesterday), nurse stated there were no concerns of unusual bleeding.   Goal of Therapy:  INR 2-3 Heparin level 0.3-0.7 thereafter  Monitor platelets by anticoagulation protocol: Yes    Plan:  1. Will order warfarin 10 mg tonight x1 dose. Will closely monitor for bleeding events. Will follow up with attending/ vascular today on anticoagulation as it relates to hemoglobin. CBC and INR daily.   2. Will continue the heparin infusion at 1200 units. Will recheck CBC and and HL with AM labs.    ADDENDUM: heparin has been discontinued and per Dr. Lucky Cowboy can HOLD warfarin for this evening.     Rowland Lathe 12/21/2018,2:12 PM

## 2018-12-21 NOTE — H&P (Signed)
[facility] History & Physical Update  The patient was interviewed and re-examined.  The patient's previous History and Physical has been reviewed and is unchanged.  There is no change in the plan of care. We plan to proceed with the scheduled procedure.  Leotis Pain, MD  12/21/2018, 8:13 AM

## 2018-12-21 NOTE — Progress Notes (Signed)
[employer] - [place] [facility]   PATIENT NAME: Sylvia [name]    MR#:  [id number]  DATE OF BIRTH:  Sep 01, 1955  Repeat angiogram of the right leg for restenosis of right SFA and popliteal stents, patient also had left leg angiogram with recurrent thrombosis of the left left SFA, popliteal stents with reconstitution, main problem today is recurrent thrombosis of right SFA, popliteal stents with thrombus status post reopening, patient needs extended thrombolysis   CHIEF COMPLAINT:   Chief Complaint  Patient presents with  . Abdominal Pain     REVIEW OF SYSTEMS:  CONSTITUTIONAL: No fever, fatigue or weakness.  EYES: No blurred or double vision.  EARS, NOSE, AND THROAT: No tinnitus or ear pain.  RESPIRATORY: No cough, shortness of breath, wheezing or hemoptysis.  CARDIOVASCULAR: No chest pain, orthopnea, edema.  GASTROINTESTINAL: Patient has nausea but no vomiting, diarrhea.  GENITOURINARY: No dysuria, hematuria.  ENDOCRINE: No polyuria, nocturia,  HEMATOLOGY: No anemia, easy bruising or bleeding SKIN:  severe right leg pain   pSYCHIATRY: No anxiety or depression.     DRUG ALLERGIES:   Allergies  Allergen Reactions  . Penicillins Hives    Has patient had a PCN reaction causing immediate rash, facial/tongue/throat swelling, SOB or lightheadedness with hypotension: Yes Has patient had a PCN reaction causing severe rash involving mucus membranes or skin necrosis: No Has patient had a PCN reaction that required hospitalization: No Has patient had a PCN reaction occurring within the last 10 years: No If all of the above answers are "NO", then may proceed with Cephalosporin use.  . Tramadol Itching    VITALS:  Blood pressure (!) 196/82, pulse 100, temperature 98.9 F (37.2 C), temperature source Oral, resp. rate 14, height 5\' 7"  (1.702 m), weight 69.3 kg, SpO2 95 %.  PHYSICAL EXAMINATION:  GENERAL:  63 y.o.-year-old patient lying in the bed with no acute  distress.  EYES: Pupils equal, round, reactive to light and accommodation. No scleral icterus. Extraocular muscles intact.  HEENT: Head atraumatic, normocephalic. Oropharynx and nasopharynx clear.  NECK:  Supple, no jugular venous distention. No thyroid enlargement, no tenderness.  LUNGS: Normal breath sounds bilaterally, no wheezing, rales,rhonchi or crepitation. No use of accessory muscles of respiration.  CARDIOVASCULAR: S1, S2 normal. No murmurs, rubs, or gallops.  ABDOMEN: Soft, nontender, nondistended. Bowel sounds present. No organomegaly or mass.  EXTREMITIES: Severe right leg pain, patient has right foot warm to touch posterior tibial pulse is present by Doppler, dorsalis pedis pulse is still absent. NEUROLOGIC: Cranial nerves II through XII are intact. Muscle strength 4/5 in all extremities. Sensation intact. Gait not checked.  PSYCHIATRIC: The patient is alert and oriented x 3.  SKIN: No obvious rash, lesion, or ulcer.   Physical Exam LABORATORY PANEL:   CBC Recent Labs  Lab 12/21/18 1221  WBC 14.4*  HGB 8.0*  HCT 23.4*  PLT 225   ------------------------------------------------------------------------------------------------------------------  Chemistries  Recent Labs  Lab 12/20/18 0320 12/21/18 0448  NA 141 141  K 3.7 3.7  CL 109 111  CO2 24 24  GLUCOSE 120* 148*  BUN 11 13  CREATININE 0.73 0.79  CALCIUM 8.8* 8.9  MG 1.7  --    ------------------------------------------------------------------------------------------------------------------  Cardiac Enzymes No results for input(s): TROPONINI in the last 168 hours. ------------------------------------------------------------------------------------------------------------------  RADIOLOGY:  No results found.  ASSESSMENT AND PLAN:   Active Problems:   DKA (diabetic ketoacidoses) (HCC)  DKA with history of diabetes 2. resolved with IV hydration,   acute gastroenteritis. resolved.  Started on IV  antibiotics secondary to enteritis and leukocytosis and possible concern for infection, CT abdomen showed enteritis. Patient received 3 days of IV antibiotics discontinue IV antibiotics as patient has no evidence of abdominal pain or leukocytosis.  Severe peripheral artery disease, of bilateral lower extremity, status post angiogram of right leg with endovascular intervention, 2 days ago, patient continued to have right leg pain and also recurrent ischemia with purple toes stores patient had repeat angiogram today, patient has recurrent thrombosis of right SFA, popliteal stents, patient also has stenosis of tibioperoneal trunk; status post catheter directed thrombolysis  Left leg angiogram with stent to left SFA Continue thrombolytics, vascular surgery to follow the patient   history of DVT.  The patient was on Coumadin, off Coumadin for 2 weeks due to recent GI bleeding.  Continue heparin drip.  Patient has history of noncompliance with Coumadin, patient was tried on different blood thinners and her insurance did not cover the cost she was placed on Coumadin and she is not compliant with Coumadin and eating a lot of leafy green vegetables and not taking Coumadin correctly as per documentation by vascular. Patient will be on TPA.  Hypertension.  Continue home hypertension medication after resuming diet. COPD.  Stable.  Constipation, continue bowel regimen while patient is on narcotics.  Discussed with patient's pharmacist   Acute anemia, patient's hemoglobin is 8, trend the hemoglobin because patient already on thrombolytics, patient's hemoglobin was 7.1 this morning watch closely for need for blood transfusion if needed.  Leukocytosis, due to multiple invasive procedures will give IV antibiotics.  Received a dose of clindamycin. All the records are reviewed and case discussed with Care Management/Social Workerr. Management plans discussed with the patient, family and they are in  agreement.  CODE STATUS: Full  TOTAL TIME TAKING CARE OF THIS PATIENT: 35 minutes.  More than 50% time spent in counseling, coordination of care   POSSIBLE D/C IN 1-2 DAYS, DEPENDING ON CLINICAL CONDITION.   Epifanio Lesches M.D on 12/21/2018   Between 7am to 6pm - Pager - [phone]  After 6pm go to [email or web address] - password [email or web address] [facility]  Office  [phone]  CC: Primary care physician; Center, [place]  Note: This dictation was prepared with Diplomatic Services operational officer dictation along with smaller phrase technology. Any transcriptional errors that result from this process are unintentional.

## 2018-12-21 NOTE — Op Note (Signed)
[facility]  Percutaneous Study/Intervention Procedural Note   Date of Surgery: 12/21/2018  Surgeon(s):Taura Lamarre    Assistants:none  Pre-operative Diagnosis: PAD with rest pain right lower extremity  Post-operative diagnosis:  Same  Procedure(s) Performed:             1.   Right lower extremity angiogram             2.  Catheter placement into right tibioperoneal trunk from left femoral approach             3.   Placement of infusion catheter for continued thrombolytic therapy using a 130 cm total length 50 cm working length catheter from the origin of the SFA down through the popliteal artery and tibioperoneal trunk  EBL: Minimal  Contrast: 15 cc  Fluoro Time: 1 minute  Moderate Conscious Sedation Time: approximately 15 minutes using 1 mg of Versed and 25 mcg of Fentanyl              Indications:  Patient is a 63 y.o.female with an infusion catheter for thrombolytic therapy.  This has been running for a few hours.  The sheath is not flushing well and for further evaluation of the existing sheath as well as the distal runoff and determine the effectiveness of the thrombolytic therapy so far, she is brought back to the angiogram suite for angiography for further evaluation.  Risks and benefits are discussed.  Procedure:  The patient was identified and appropriate procedural time out was performed.  The patient was then placed supine on the table and prepped and draped in the usual sterile fashion. Moderate conscious sedation was administered during a face to face encounter with the patient throughout the procedure with my supervision of the RN administering medicines and monitoring the patient's vital signs, pulse oximetry, telemetry and mental status throughout from the start of the procedure until the patient was taken to the recovery room.  The existing thrombolytic catheter was removed and imaging performed through the sheath demonstrated continued thrombosis of  the right SFA stent, popliteal stent, and very poor distal imaging seen.  The sheath itself was patent and did not need to be exchanged.  We exchanged for a 130 cm total length, 50 cm working length catheter and advanced this down into the tibioperoneal trunk where imaging was performed distally which showed continued thrombosis and stenosis in the tibioperoneal trunk and proximal posterior tibial artery.  This was parked with the proximal portion of the catheter near the origin of the SFA and the distal portion of the tibioperoneal trunk and continuous thrombolytic therapy will continue.  The patient be brought back for another angiogram either later this evening or tomorrow.  The patient was taken to the recovery room in stable condition having tolerated the procedure well.  Findings:                           Right Lower Extremity:  Continued thrombosis of the right SFA stent, popliteal stent, and thrombus remaining in the tibioperoneal trunk and posterior tibial arteries.  Decision to continue thrombolytic therapy   Disposition: Patient was taken to the recovery room in stable condition having tolerated the procedure well.  Complications: None  Leotis Pain 12/21/2018 12:54 PM   This note was created with Dragon Medical transcription system. Any errors in dictation are purely unintentional.

## 2018-12-21 NOTE — Op Note (Signed)
[facility]  Percutaneous Study/Intervention Procedural Note   Date of Surgery: 12/21/2018  Surgeon(s):Jara Feider    Assistants:none  Pre-operative Diagnosis: PAD with rest pain BLE  Post-operative diagnosis:  Same  Procedure(s) Performed:             1.  Ultrasound guidance for vascular access right femoral artery             2.  Catheter placement into left common femoral artery from right femoral approach             3.  Aortogram and selective left lower extremity angiogram             4.   Catheter directed thrombolytic therapy with 4 mg TPA to the left SFA and popliteal arteries             5.   Mechanical thrombectomy to the left SFA, popliteal artery, and tibioperoneal trunk with the penumbra cat 6 device  6.  Viabahn stent placement x2 to the left SFA and popliteal arteries with 6 mm diameter by 25 cm length stent and 6 mm diameter by 10 cm length stent for residual stenosis and thrombus after thrombectomy             7.  StarClose closure device right femoral artery  8.  Ultrasound guidance for vascular access the left femoral artery  9.  Catheter placement into right popliteal artery from left femoral approach  10.  Right lower extremity angiogram  11.  Catheter directed thrombolytic therapy with 4 mg of TPA to the right SFA, popliteal artery, and tibioperoneal trunk with placement of infusion catheter with continued thrombolytic therapy to continue after this procedure using a 130 cm total length 50 cm working length catheter  EBL: 200 cc  Contrast: 110 cc  Fluoro Time: 9.4 minutes  Moderate Conscious Sedation Time: approximately 60 minutes using 7 mg of Versed and 300 Mcg of Fentanyl              Indications:  Patient is a 63 y.o.female with recurrent rest pain of both lower extremities.  She is undergone multiple previous intervention in the past including a right leg angiogram earlier this week.  She is scheduled to have her left leg treated  today but is still having right leg symptoms. The patient is brought in for angiography for further evaluation and potential treatment.  Due to the limb threatening nature of the situation, angiogram was performed for attempted limb salvage. The patient is aware that if the procedure fails, amputation would be expected.  The patient also understands that even with successful revascularization, amputation may still be required due to the severity of the situation.  Risks and benefits are discussed and informed consent is obtained.   Procedure:  The patient was identified and appropriate procedural time out was performed.  The patient was then placed supine on the table and prepped and draped in the usual sterile fashion. Moderate conscious sedation was administered during a face to face encounter with the patient throughout the procedure with my supervision of the RN administering medicines and monitoring the patient's vital signs, pulse oximetry, telemetry and mental status throughout from the start of the procedure until the patient was taken to the recovery room. Ultrasound was used to evaluate the right common femoral artery.  It was patent .  A digital ultrasound image was acquired.  A Seldinger needle was used to access the right common femoral artery under  direct ultrasound guidance and a permanent image was performed.  A 0.035 J wire was advanced without resistance and a 5Fr sheath was placed.  Pigtail catheter was placed into the aorta and an AP aortogram was performed. This demonstrated normal renal arteries and the known lesion in the aorta which was unchanged from earlier in the week and iliac segments without significant stenosis. I then crossed the aortic bifurcation and advanced to the left femoral head. Selective left lower extremity angiogram was then performed. This demonstrated that the left SFA and popliteal artery had recurrent thrombosis.  There appeared to be reconstitution of what was the  tibioperoneal artery as she has a known very high takeoff of the anterior tibial artery on the left. It was felt that it was in the patient's best interest to proceed with intervention after these images to avoid a second procedure and a larger amount of contrast and fluoroscopy based off of the findings from the initial angiogram. The patient was systemically heparinized and a 6 [place] destination sheath was then placed over the Terumo Advantage wire. I then used a Kumpe catheter and the advantage wire to easily navigate through the lesion and instilled 4 mg of TPA in the left SFA and popliteal arteries.  I then brought the penumbra cat 6 device onto the field and perform multiple passes with mechanical thrombectomy in the left SFA, popliteal artery, and tibioperoneal trunk.  Several large runs of thrombus were removed.  Following that both above and below the previously placed stents were high-grade residual stenosis with thrombus and I elected to place a covered stent in these locations.  A V 18 wire was placed and a 6 mm diameter by 25 cm length and a 6 mm diameter by 10 cm length Viabahn stent was deployed.  This was postdilated with a 4 mm diameter tonics drug-coated balloon distally and a 5 mm diameter Lutonix drug-coated balloon proximally with excellent angiographic completion result and less than 10% residual stenosis. I elected to terminate the procedure. The sheath was removed and StarClose closure device was deployed in the right femoral artery with excellent hemostatic result. On the completion imaging the right femoral artery was seen that the right SFA had recurrent occlusion.  I then accessed the left femoral artery with ultrasound guidance without difficulty with a Seldinger needle and a permanent image was recorded.  A 6 French 23 cm sheath was then placed up the left and we got up and over the aortic bifurcation with a rim catheter getting into the SFA occlusion without difficulty.  Advance  catheter down into the popliteal artery on the right and imaging showed thrombus and stenosis in the tibioperoneal trunk and proximal posterior tibial artery is the only runoff distally.  At this point, I used a 50 cm working length 90 cm total length thrombolytic catheter and instilled 4 mg of TPA throughout the right SFA, popliteal artery, and down to the proximal tibioperoneal trunk.  The catheter was then left in place and continuous infusion of thrombolytic therapy will be given throughout the day.  The patient was taken to the recovery room in stable condition having tolerated the procedure well.  Findings:               Aortogram:  This demonstrated normal renal arteries and the known lesion in the aorta which was unchanged from earlier in the week and iliac segments without significant stenosis.             Left  lower Extremity:  This demonstrated that the left SFA and popliteal artery had recurrent thrombosis.  There appeared to be reconstitution of what was the tibioperoneal artery as she has a known very high takeoff of the anterior tibial artery on the left  Right lower extremity: Recurrent thrombosis of the right SFA and popliteal stents with thrombus and stenosis in the tibioperoneal trunk and proximal posterior tibial artery.   Disposition: Patient was taken to the recovery room in stable condition having tolerated the procedure well.  Complications: None  Leotis Pain 12/21/2018 10:17 AM   This note was created with Dragon Medical transcription system. Any errors in dictation are purely unintentional.

## 2018-12-21 NOTE — Progress Notes (Signed)
PT Cancellation Note  Patient Details Name: Sylvia [name] MRN: [id number] DOB: Sep 10, 1955   Cancelled Treatment:    Reason Eval/Treat Not Completed: Patient at procedure or test/unavailable.  Pt currently off floor at procedure.  Will re-attempt PT session at a later date/time as medically appropriate.  Leitha Bleak, PT 12/21/18, 8:17 AM [phone]

## 2018-12-21 NOTE — Progress Notes (Signed)
ANTICOAGULATION CONSULT NOTE   Pharmacy Consult for  Heparin and Warfarin Indication: PAD  Allergies  Allergen Reactions  . Penicillins Hives    Has patient had a PCN reaction causing immediate rash, facial/tongue/throat swelling, SOB or lightheadedness with hypotension: Yes Has patient had a PCN reaction causing severe rash involving mucus membranes or skin necrosis: No Has patient had a PCN reaction that required hospitalization: No Has patient had a PCN reaction occurring within the last 10 years: No If all of the above answers are "NO", then may proceed with Cephalosporin use.  . Tramadol Itching   Patient Measurements: Height: 5\' 7"  (170.2 cm) Weight: 155 lb 3.2 oz (70.4 kg) IBW/kg (Calculated) : 61.6 Heparin Dosing Weight: 70.4 kg   Vital Signs: Temp: 98.9 F (37.2 C) (09/03 0453) Temp Source: Oral (09/03 0453) BP: 145/74 (09/03 0453) Pulse Rate: 81 (09/03 0453)  Labs: Recent Labs    12/19/18 1013 12/19/18 1138  12/20/18 0320 12/20/18 1119 12/21/18 0448  HGB 8.4*  --   --  7.4*  --  7.1*  HCT 24.9*  --   --  21.8*  --  21.0*  PLT 203  --   --  209  --  243  APTT  --  32  --   --   --   --   LABPROT 14.2  --   --  14.9  --  15.8*  INR 1.1  --   --  1.2  --  1.3*  HEPARINUNFRC  --   --    < > 0.36 0.38 0.33  CREATININE  --   --   --  0.73  --  0.79   < > = values in this interval not displayed.    Estimated Creatinine Clearance: 70 mL/min (by C-G formula based on SCr of 0.79 mg/dL).   Medical History: Past Medical History:  Diagnosis Date  . Anxiety    h/o  . COPD (chronic obstructive pulmonary disease) (HCC)   . Diabetes mellitus without complication (HCC)   . GERD (gastroesophageal reflux disease)   . Hypertension    bp under control-off meds since 2019    Medications:  Medications Prior to Admission  Medication Sig Dispense Refill Last Dose  . albuterol (PROVENTIL HFA;VENTOLIN HFA) 108 (90 Base) MCG/ACT inhaler Inhale 2 puffs into the lungs  every 6 (six) hours as needed for wheezing or shortness of breath. 1 Inhaler 2 prn at prn  . alum & mag hydroxide-simeth (MAALOX/MYLANTA) 200-200-20 MG/5ML suspension Take 30 mLs by mouth every 4 (four) hours as needed for indigestion or heartburn. 355 mL 0 prn at prn  . atorvastatin (LIPITOR) 10 MG tablet Take 1 tablet (10 mg total) by mouth daily. 90 tablet 3 12/13/2018 at Unknown time  . Biotin 1000 MCG tablet Take 1,000 tablets by mouth daily.    12/13/2018 at Unknown time  . cetirizine (ZYRTEC) 10 MG tablet Take 10 mg by mouth daily.   12/13/2018 at Unknown time  . cholecalciferol (VITAMIN D3) 25 MCG (1000 UT) tablet Take 1,000 Units by mouth daily.   12/13/2018 at Unknown time  . iron polysaccharides (NIFEREX) 150 MG capsule Take 1 capsule (150 mg total) by mouth daily. 30 capsule 3 12/13/2018 at Unknown time  . Multiple Vitamins-Minerals (WOMENS MULTIVITAMIN PO) Take 1 tablet by mouth daily.   12/13/2018 at Unknown time  . pantoprazole (PROTONIX) 40 MG tablet Take 1 tablet (40 mg total) by mouth daily. 30 tablet 0 12/13/2018 at Unknown time  .  sitaGLIPtin (JANUVIA) 100 MG tablet Take 100 mg by mouth daily.   12/13/2018 at Unknown time  . vitamin C (ASCORBIC ACID) 500 MG tablet Take 500 mg by mouth daily.   12/13/2018 at Unknown time  . warfarin (COUMADIN) 5 MG tablet Take 1 tablet (5 mg total) by mouth daily at 6 PM. 30 tablet 5 12/13/2018 at Unknown time    Assessment: History of DVT. The patient was on Coumadin 5 mg daily, off Coumadin for 2 weeks due to recent GI bleeding. Vascular suggested to start heparin and holding coumadin (DVT). Heparin was discontinued yesterday and Aggrastat was started while still on Alteplase. Pharmacy was consulted to start heparin for PAD and to restart warfarin.   Given patient recently had tPA, will need to start heparin infusion (without a bolus) once aPTT<80 seconds. Will continue heparin infusion until INR is closer to goal.    Heparin Course: 9/1:  HL @ 1949  = 0.16 9/2:  HL @ 0320 = 0.36, therapeutic on 1200 units/hr 9/2:  HL @ 1119 = 0.38, therapeutic x2 on 1200 units/hr. It was noted during rounds, by the nurse, that the patient right foot does not have a pulse and is purple. Of note, there are no concerns for bleeding. Discussed with Dr. Lucky Cowboy on whether to continue the heparin infusion. 9/3 @ 0448 HL = 0.33, therapeutic x 3, continue heparin at current rate   Warfarin:  Drug interaction with ketorolac may increase the risk of bleeding.   Date INR  Warfarin dose   9/1 1.1  7.5 mg  9/2 1.2                    Today's INR is subtherapeutic as expected. Given recent GI bleed, will conservatively boost warfarin dose.   Goal of Therapy:  INR 2-3 Heparin level 0.3-0.5 for the first 24 hours, then 0.3-0.7 thereafter (starting on 12/20/18 @1400 ) Monitor platelets by anticoagulation protocol: Yes    Plan:  1. Will order warfarin 10 mg tonight x1 dose. Will closely monitor for adverse side effects.  2. Per Dr. Lucky Cowboy, will continue the infusion at 1200 units. Will recheck CBC and and HL with AM labs.   CBC and INR daily.   [place] Crane, Teliyah Royal A 12/21/2018,5:46 AM

## 2018-12-22 ENCOUNTER — Inpatient Hospital Stay: Payer: Medicaid Other

## 2018-12-22 ENCOUNTER — Encounter
Admission: EM | Disposition: A | Discharge status: Home Health | Payer: Self-pay | Source: Home / Self Care | Attending: Internal Medicine

## 2018-12-22 DIAGNOSIS — I70221 Atherosclerosis of native arteries of extremities with rest pain, right leg: Secondary | ICD-10-CM

## 2018-12-22 HISTORY — PX: LOWER EXTREMITY INTERVENTION: CATH118252

## 2018-12-22 LAB — BASIC METABOLIC PANEL
Anion gap: 8 (ref 5–15)
BUN: 18 mg/dL (ref 8–23)
CO2: 19 mmol/L — ABNORMAL LOW (ref 22–32)
Calcium: 7.3 mg/dL — ABNORMAL LOW (ref 8.9–10.3)
Chloride: 114 mmol/L — ABNORMAL HIGH (ref 98–111)
Creatinine, Ser: 1.38 mg/dL — ABNORMAL HIGH (ref 0.44–1.00)
GFR calc Af Amer: 47 mL/min — ABNORMAL LOW (ref >60)
GFR calc non Af Amer: 40 mL/min — ABNORMAL LOW (ref >60)
Glucose, Bld: 192 mg/dL — ABNORMAL HIGH (ref 70–99)
Potassium: 3.5 mmol/L (ref 3.5–5.1)
Sodium: 141 mmol/L (ref 135–145)

## 2018-12-22 LAB — CBC WITH DIFFERENTIAL/PLATELET
Abs Immature Granulocytes: 0.16 10*3/uL — ABNORMAL HIGH (ref 0.00–0.07)
Basophils Absolute: 0.1 10*3/uL (ref 0.0–0.1)
Basophils Relative: 0 %
Eosinophils Absolute: 0.1 10*3/uL (ref 0.0–0.5)
Eosinophils Relative: 0 %
HCT: 20.7 % — ABNORMAL LOW (ref 36.0–46.0)
Hemoglobin: 6.9 g/dL — ABNORMAL LOW (ref 12.0–15.0)
Immature Granulocytes: 1 %
Lymphocytes Relative: 7 %
Lymphs Abs: 1.2 10*3/uL (ref 0.7–4.0)
MCH: 28.4 pg (ref 26.0–34.0)
MCHC: 33.3 g/dL (ref 30.0–36.0)
MCV: 85.2 fL (ref 80.0–100.0)
Monocytes Absolute: 1.7 10*3/uL — ABNORMAL HIGH (ref 0.1–1.0)
Monocytes Relative: 10 %
Neutro Abs: 13.4 10*3/uL — ABNORMAL HIGH (ref 1.7–7.7)
Neutrophils Relative %: 82 %
Platelets: 196 10*3/uL (ref 150–400)
RBC: 2.43 MIL/uL — ABNORMAL LOW (ref 3.87–5.11)
RDW: 14.7 % (ref 11.5–15.5)
WBC: 16.5 10*3/uL — ABNORMAL HIGH (ref 4.0–10.5)
nRBC: 0 % (ref 0.0–0.2)

## 2018-12-22 LAB — GLUCOSE, CAPILLARY
Glucose-Capillary: 178 mg/dL — ABNORMAL HIGH (ref 70–99)
Glucose-Capillary: 143 mg/dL — ABNORMAL HIGH (ref 70–99)
Glucose-Capillary: 209 mg/dL — ABNORMAL HIGH (ref 70–99)
Glucose-Capillary: 170 mg/dL — ABNORMAL HIGH (ref 70–99)

## 2018-12-22 LAB — CBC
HCT: 19.7 % — ABNORMAL LOW (ref 36.0–46.0)
HCT: 23.5 % — ABNORMAL LOW (ref 36.0–46.0)
Hemoglobin: 6.7 g/dL — ABNORMAL LOW (ref 12.0–15.0)
Hemoglobin: 7.9 g/dL — ABNORMAL LOW (ref 12.0–15.0)
MCH: 27.9 pg (ref 26.0–34.0)
MCH: 28.2 pg (ref 26.0–34.0)
MCHC: 34 g/dL (ref 30.0–36.0)
MCHC: 33.6 g/dL (ref 30.0–36.0)
MCV: 82.1 fL (ref 80.0–100.0)
MCV: 83.9 fL (ref 80.0–100.0)
Platelets: 175 10*3/uL (ref 150–400)
Platelets: 147 10*3/uL — ABNORMAL LOW (ref 150–400)
RBC: 2.4 MIL/uL — ABNORMAL LOW (ref 3.87–5.11)
RBC: 2.8 MIL/uL — ABNORMAL LOW (ref 3.87–5.11)
RDW: 14.6 % (ref 11.5–15.5)
RDW: 14.5 % (ref 11.5–15.5)
WBC: 13.5 10*3/uL — ABNORMAL HIGH (ref 4.0–10.5)
WBC: 13.1 10*3/uL — ABNORMAL HIGH (ref 4.0–10.5)
nRBC: 0 % (ref 0.0–0.2)
nRBC: 0 % (ref 0.0–0.2)

## 2018-12-22 LAB — PREPARE RBC (CROSSMATCH)

## 2018-12-22 LAB — FIBRINOGEN
Fibrinogen: 81 mg/dL — CL (ref 210–475)
Fibrinogen: 68 mg/dL — CL (ref 210–475)

## 2018-12-22 LAB — HEPARIN INDUCED PLATELET AB (HIT ANTIBODY): Heparin Induced Plt Ab: 0.202 OD (ref 0.000–0.400)

## 2018-12-22 LAB — PROTIME-INR
INR: 2.5 — ABNORMAL HIGH (ref 0.8–1.2)
Prothrombin Time: 26.2 seconds — ABNORMAL HIGH (ref 11.4–15.2)

## 2018-12-22 SURGERY — LOWER EXTREMITY INTERVENTION
Anesthesia: Moderate Sedation

## 2018-12-22 MED ORDER — DIPHENHYDRAMINE HCL 12.5 MG/5ML PO ELIX
12.5000 mg | ORAL_SOLUTION | Freq: Four times a day (QID) | ORAL | Status: DC | PRN
  Filled 2018-12-22: qty 5

## 2018-12-22 MED ORDER — HYDROMORPHONE 1 MG/ML IV SOLN
INTRAVENOUS | Status: DC
  Administered 2018-12-22: 25 mg via INTRAVENOUS
  Administered 2018-12-23: 2.7 mL via INTRAVENOUS
  Administered 2018-12-24: 0.1 mg via INTRAVENOUS
  Administered 2018-12-24: 1.2 mL via INTRAVENOUS
  Administered 2018-12-24: 0.4 mL via INTRAVENOUS
  Administered 2018-12-24: 25 mg via INTRAVENOUS
  Administered 2018-12-24: 0.1 mg via INTRAVENOUS
  Administered 2018-12-25 (×2): 25 mg via INTRAVENOUS
  Administered 2018-12-26 – 2018-12-28 (×6): via INTRAVENOUS
  Administered 2018-12-28: 2.1 mg via INTRAVENOUS
  Administered 2018-12-29: 1.8 mg via INTRAVENOUS
  Administered 2018-12-29: 0.2 mL via INTRAVENOUS
  Administered 2018-12-29: 0.8 mg via INTRAVENOUS
  Administered 2018-12-29 (×2): 0.2 mg via INTRAVENOUS
  Administered 2018-12-29: 0.8 mg via INTRAVENOUS
  Filled 2018-12-22 (×34): qty 30

## 2018-12-22 MED ORDER — ONDANSETRON HCL 4 MG/2ML IJ SOLN
4.0000 mg | Freq: Four times a day (QID) | INTRAMUSCULAR | Status: DC | PRN
  Administered 2018-12-26: 4 mg via INTRAVENOUS

## 2018-12-22 MED ORDER — SODIUM CHLORIDE 0.9 % IV SOLN: INTRAVENOUS | Status: DC

## 2018-12-22 MED ORDER — HYDROMORPHONE HCL 1 MG/ML IJ SOLN
INTRAMUSCULAR | Status: AC
  Filled 2018-12-22: qty 1

## 2018-12-22 MED ORDER — IODIXANOL 320 MG/ML IV SOLN
INTRAVENOUS | Status: DC | PRN
  Administered 2018-12-22: 75 mL

## 2018-12-22 MED ORDER — HYDROMORPHONE HCL 1 MG/ML IJ SOLN
1.0000 mg | Freq: Once | INTRAMUSCULAR | Status: AC | PRN
  Administered 2018-12-22: 16:00:00 1 mg via INTRAVENOUS

## 2018-12-22 MED ORDER — SODIUM CHLORIDE 0.9% IV SOLUTION
Freq: Once | INTRAVENOUS | Status: AC
  Administered 2018-12-22: 02:00:00 via INTRAVENOUS

## 2018-12-22 MED ORDER — FENTANYL CITRATE (PF) 100 MCG/2ML IJ SOLN
INTRAMUSCULAR | Status: DC | PRN
  Administered 2018-12-22 (×2): 50 ug via INTRAVENOUS

## 2018-12-22 MED ORDER — BIVALIRUDIN TRIFLUOROACETATE 250 MG IV SOLR
INTRAVENOUS | Status: AC
  Filled 2018-12-22: qty 250

## 2018-12-22 MED ORDER — DIPHENHYDRAMINE HCL 50 MG/ML IJ SOLN: 12.5000 mg | Freq: Four times a day (QID) | INTRAMUSCULAR | Status: DC | PRN

## 2018-12-22 MED ORDER — NALOXONE HCL 0.4 MG/ML IJ SOLN: 0.4000 mg | INTRAMUSCULAR | Status: DC | PRN

## 2018-12-22 MED ORDER — MIDAZOLAM HCL 5 MG/5ML IJ SOLN
INTRAMUSCULAR | Status: AC
  Filled 2018-12-22: qty 5

## 2018-12-22 MED ORDER — SODIUM CHLORIDE 0.9% FLUSH: 9.0000 mL | INTRAVENOUS | Status: DC | PRN

## 2018-12-22 MED ORDER — CLINDAMYCIN PHOSPHATE 300 MG/50ML IV SOLN
INTRAVENOUS | Status: AC
  Filled 2018-12-22: qty 50

## 2018-12-22 MED ORDER — BIVALIRUDIN BOLUS VIA INFUSION
INTRAVENOUS | Status: DC | PRN
  Administered 2018-12-22: 5.2 mg via INTRAVENOUS

## 2018-12-22 MED ORDER — MIDAZOLAM HCL 2 MG/ML PO SYRP: 8.0000 mg | ORAL_SOLUTION | Freq: Once | ORAL | Status: DC | PRN

## 2018-12-22 MED ORDER — FENTANYL CITRATE (PF) 100 MCG/2ML IJ SOLN
INTRAMUSCULAR | Status: AC
  Filled 2018-12-22: qty 4

## 2018-12-22 MED ORDER — TIROFIBAN HCL IV 12.5 MG/250 ML
INTRAVENOUS | Status: AC
  Administered 2018-12-22: 0.075 ug/kg/min via INTRAVENOUS
  Filled 2018-12-22: qty 250

## 2018-12-22 MED ORDER — TIROFIBAN HCL IV 12.5 MG/250 ML
0.0750 ug/kg/min | INTRAVENOUS | Status: AC
  Administered 2018-12-22: 15:00:00 0.075 ug/kg/min via INTRAVENOUS

## 2018-12-22 MED ORDER — METHYLPREDNISOLONE SODIUM SUCC 125 MG IJ SOLR: 125.0000 mg | Freq: Once | INTRAMUSCULAR | Status: DC | PRN

## 2018-12-22 MED ORDER — CLINDAMYCIN PHOSPHATE 300 MG/50ML IV SOLN
300.0000 mg | Freq: Once | INTRAVENOUS | Status: AC
  Administered 2018-12-22: 13:00:00 300 mg via INTRAVENOUS

## 2018-12-22 MED ORDER — SODIUM CHLORIDE 0.9% IV SOLUTION
Freq: Once | INTRAVENOUS | Status: AC
  Administered 2018-12-22: 23:00:00 via INTRAVENOUS

## 2018-12-22 MED ORDER — DIPHENHYDRAMINE HCL 50 MG/ML IJ SOLN: 50.0000 mg | Freq: Once | INTRAMUSCULAR | Status: DC | PRN

## 2018-12-22 MED ORDER — OXYCODONE HCL 5 MG PO TABS
ORAL_TABLET | ORAL | Status: AC
  Administered 2018-12-22: 10 mg via ORAL
  Filled 2018-12-22: qty 2

## 2018-12-22 MED ORDER — MIDAZOLAM HCL 2 MG/2ML IJ SOLN
INTRAMUSCULAR | Status: DC | PRN
  Administered 2018-12-22: 1 mg via INTRAVENOUS
  Administered 2018-12-22: 2 mg via INTRAVENOUS

## 2018-12-22 MED ORDER — HYDROMORPHONE HCL 1 MG/ML IJ SOLN
INTRAMUSCULAR | Status: AC
  Administered 2018-12-22: 1 mg via INTRAVENOUS
  Filled 2018-12-22: qty 1

## 2018-12-22 MED ORDER — FAMOTIDINE 20 MG PO TABS: 40.0000 mg | ORAL_TABLET | Freq: Once | ORAL | Status: DC | PRN

## 2018-12-22 SURGICAL SUPPLY — 26 items
BALLN LUTONIX 018 4X100X130 (BALLOONS) ×2
BALLN LUTONIX 018 5X300X130 (BALLOONS) ×2
BALLN LUTONIX DCB 5X100X130 (BALLOONS) ×2
BALLN LUTONIX DCB 6X100X130 (BALLOONS) ×2
BALLN ULTRVRSE 2.5X300X150 (BALLOONS) ×2
BALLN ULTRVRSE 7X80X75 (BALLOONS) ×2
BALLOON LUTONIX 018 4X100X130 (BALLOONS) IMPLANT
BALLOON LUTONIX 018 5X300X130 (BALLOONS) IMPLANT
BALLOON LUTONIX DCB 5X100X130 (BALLOONS) IMPLANT
BALLOON LUTONIX DCB 6X100X130 (BALLOONS) IMPLANT
BALLOON ULTRVRSE 2.5X300X150 (BALLOONS) IMPLANT
BALLOON ULTRVRSE 7X80X75 (BALLOONS) IMPLANT
CANISTER PENUMBRA ENGINE (MISCELLANEOUS) ×2 IMPLANT
CATH BEACON 5 .038 100 VERT TP (CATHETERS) ×2 IMPLANT
CATH CXI SUPP 2.6F 150 ST (CATHETERS) ×2 IMPLANT
CATH INDIGO CAT6 KIT (CATHETERS) ×2 IMPLANT
CATH INDIGO SEP 6 (CATHETERS) ×1 IMPLANT
CATH SEEKER .035X150CM (CATHETERS) ×1 IMPLANT
DEVICE PRESTO INFLATION (MISCELLANEOUS) ×2 IMPLANT
DEVICE STARCLOSE SE CLOSURE (Vascular Products) ×1 IMPLANT
GLIDEWIRE ADV .035X260CM (WIRE) ×1 IMPLANT
PACK ANGIOGRAPHY (CUSTOM PROCEDURE TRAY) ×2 IMPLANT
STENT LIFESTAR 8X80X80 (Permanent Stent) ×1 IMPLANT
STENT VIABAHN 6X250X120 (Permanent Stent) ×1 IMPLANT
WIRE G V18X300CM (WIRE) ×3 IMPLANT
WIRE MAGIC TORQUE 260C (WIRE) ×2 IMPLANT

## 2018-12-22 NOTE — Progress Notes (Signed)
OT Cancellation Note  Patient Details Name: Sylvia [name] MRN: [id number] DOB: 06-Nov-1955   Cancelled Treatment:    Reason Eval/Treat Not Completed: Patient not medically ready Spoke to PT and reviewed chart.  Pt currently receiving alteplase and not appropriate for OT d/t this (need to hold therapy for 24 hours after alteplase treatment ends).  Pt also off floor again for another procedure.  D/t above, will discontinue current OT order.  Please re-consult OT when pt is medically appropriate for therapy treatment.  Chrys Racer, OTR/L, NTMTC ascom [phone] 12/22/18, 1:24 PM

## 2018-12-22 NOTE — Progress Notes (Signed)
[facility]  Daily Progress Note   Subjective  - 1 Day Post-Op  Some oozing overnight.  Got one unit PRBC and Hgb rose appropriately. Still with severe right foot pain. Right foot remains cold.  Objective Vitals:   12/22/18 0500 12/22/18 0510 12/22/18 0600 12/22/18 0700  BP: (!) 156/71 (!) 156/69 (!) 150/76 (!) 178/78  Pulse: 92 90 89 (!) 105  Resp: 19 18 17 12   Temp:  98.9 F (37.2 C)  98.4 F (36.9 C)  TempSrc:  Oral  Oral  SpO2: 100% 100% 100% 100%  Weight:      Height:        Intake/Output Summary (Last 24 hours) at 12/22/2018 0904 Last data filed at 12/22/2018 6389 Gross per 24 hour  Intake 865.7 ml  Output 1725 ml  Net -859.3 ml    PULM  CTAB CV  RRR VASC  Right foot cool, no palpable pulses.  Laboratory CBC    Component Value Date/Time   WBC 13.1 (H) 12/22/2018 0625   HGB 7.9 (L) 12/22/2018 0625   HCT 23.5 (L) 12/22/2018 0625   PLT 147 (L) 12/22/2018 0625    BMET    Component Value Date/Time   NA 141 12/22/2018 0625   K 3.5 12/22/2018 0625   CL 114 (H) 12/22/2018 0625   CO2 19 (L) 12/22/2018 0625   GLUCOSE 192 (H) 12/22/2018 0625   BUN 18 12/22/2018 0625   CREATININE 1.38 (H) 12/22/2018 0625   CALCIUM 7.3 (L) 12/22/2018 0625   GFRNONAA 40 (L) 12/22/2018 0625   GFRAA 47 (L) 12/22/2018 3734    Assessment/Planning: POD #1 s/p BLE angiograms.     To go back to angio for right leg angio today after tpa overnight  Her fibrinogen is low and INR are high.  tpa was held for a couple hours and restarted at a lower rate.  Very high risk of limb loss in this difficult situation    Leotis Pain  12/22/2018, 9:04 AM

## 2018-12-22 NOTE — Progress Notes (Signed)
[place] checked in with pt during rounds. Pt was preparing to have a vascular procedure. Ch provided words of encouragement for pt that was waiting for her sister to arrive to send her off with well wished before the surgery. Pt's sister shared that their mother died here in ICU and was triggered by being in the space since the death was fairly recent. Ch understood and encouraged the sister to go complete her errands and that she could received a phone call update.  Goals: F/U with pt post-op; contact the sister; provided prayer and a prayer shawl for pt. Pt is celebrating a birthday [birth year].    12/22/18 1200  Clinical Encounter Type  Visited With Patient;Family  Visit Type Follow-up;Pre-op  Referral From Chaplain  Consult/Referral To Chaplain  Spiritual Encounters  Spiritual Needs Emotional;Grief support  Stress Factors  Patient Stress Factors Major life changes  Family Stress Factors None identified

## 2018-12-22 NOTE — Progress Notes (Signed)
   12/22/18 0700  Clinical Encounter Type  Visited With Patient  Visit Type Initial  Referral From Nurse  Consult/Referral To Chaplain  Spiritual Encounters  Spiritual Needs Prayer;Emotional  Stress Factors  Patient Stress Factors Health changes  Chaplain was ask to visit patient for prayer. The patient was lying in bed and Chaplain made pastoral presences known. Chaplain ask patient how was she feeling and patient said ok. Chaplain ask patient if she desired prayer and patient said yes, so Chaplain prayed with patient and she was appreciative.

## 2018-12-22 NOTE — Progress Notes (Signed)
PT Cancellation Note  Patient Details Name: Sylvia [name] MRN: [id number] DOB: 07-01-1955   Cancelled Treatment:    Reason Eval/Treat Not Completed: Patient not medically ready.  Pt currently receiving alteplase and not appropriate for PT d/t this (need to hold therapy for 24 hours after alteplase treatment ends).  Pt also off floor again for another procedure.  D/t above, will discontinue current PT order.  Please re-consult PT when pt is medically appropriate for therapy treatment (discussed with pt's nurse).  Leitha Bleak, PT 12/22/18, 12:56 PM [phone]

## 2018-12-22 NOTE — Progress Notes (Signed)
[facility] NAME: Sylvia [name]    MR#:  [id number]  DATE OF BIRTH:  06-15-1955  Status post right leg angiogram yesterday, it is getting TPA also to ICU.  Patient right foot is still very cold and also has pain.  Right foot is swollen and noted to have black toes especially in the right great toe, right middle toe.  And there is going for repeat angiogram of the right leg  CHIEF COMPLAINT:   Chief Complaint  Patient presents with  . Abdominal Pain   Patient says today is her birthday, her sister is visiting.   REVIEW OF SYSTEMS:  CONSTITUTIONAL: No fever, fatigue or weakness.  EYES: No blurred or double vision.  EARS, NOSE, AND THROAT: No tinnitus or ear pain.  RESPIRATORY: No cough, shortness of breath, wheezing or hemoptysis.  CARDIOVASCULAR: No chest pain, orthopnea, edema.  GASTROINTESTINAL: Patient has nausea but no vomiting, diarrhea.  GENITOURINARY: No dysuria, hematuria.  ENDOCRINE: No polyuria, nocturia,  HEMATOLOGY: No anemia, easy bruising or bleeding SKIN:  severe right leg pain   pSYCHIATRY: No anxiety or depression.  Right leg pain, swelling, cold to touch.   DRUG ALLERGIES:   Allergies  Allergen Reactions  . Penicillins Hives    Has patient had a PCN reaction causing immediate rash, facial/tongue/throat swelling, SOB or lightheadedness with hypotension: Yes Has patient had a PCN reaction causing severe rash involving mucus membranes or skin necrosis: No Has patient had a PCN reaction that required hospitalization: No Has patient had a PCN reaction occurring within the last 10 years: No If all of the above answers are "NO", then may proceed with Cephalosporin use.  . Tramadol Itching    VITALS:  Blood pressure (!) 160/89, pulse (!) 105, temperature 98.4 F (36.9 C), resp. rate 18, height 5\' 7"  (1.702 m), weight 69.3 kg, SpO2 100 %.  PHYSICAL EXAMINATION:  GENERAL:  63 y.o.-year-old patient lying in the  bed with no acute distress.  EYES: Pupils equal, round, reactive to light and accommodation. No scleral icterus. Extraocular muscles intact.  HEENT: Head atraumatic, normocephalic. Oropharynx and nasopharynx clear.  NECK:  Supple, no jugular venous distention. No thyroid enlargement, no tenderness.  LUNGS: Normal breath sounds bilaterally, no wheezing, rales,rhonchi or crepitation. No use of accessory muscles of respiration.  CARDIOVASCULAR: S1, S2 normal. No murmurs, rubs, or gallops.  ABDOMEN: Soft, nontender, nondistended. Bowel sounds present. No organomegaly or mass.  EXTREMITIES: Severe right leg pain, patient has right foot warm to touch posterior tibial pulse is present by Doppler, dorsalis pedis pulse is still absent. NEUROLOGIC: Cranial nerves II through XII are intact. Muscle strength 4/5 in all extremities. Sensation intact. Gait not checked.  PSYCHIATRIC: The patient is alert and oriented x 3.  SKIN: Right leg pain, swelling, cool to touch noted to have right great great toe black discoloration.   LABORATORY PANEL:   CBC Recent Labs  Lab 12/22/18 0625  WBC 13.1*  HGB 7.9*  HCT 23.5*  PLT 147*   ------------------------------------------------------------------------------------------------------------------  Chemistries  Recent Labs  Lab 12/20/18 0320  12/22/18 0625  NA 141   < > 141  K 3.7   < > 3.5  CL 109   < > 114*  CO2 24   < > 19*  GLUCOSE 120*   < > 192*  BUN 11   < > 18  CREATININE 0.73   < > 1.38*  CALCIUM 8.8*   < > 7.3*  MG 1.7  --   --    < > = values in this interval not displayed.   ------------------------------------------------------------------------------------------------------------------  Cardiac Enzymes No results for input(s): TROPONINI in the last 168 hours. ------------------------------------------------------------------------------------------------------------------  RADIOLOGY:  No results found.  ASSESSMENT AND PLAN:    Active Problems:   DKA (diabetic ketoacidoses) (HCC)  DKA with history of diabetes 2. resolved with IV hydration,   acute gastroenteritis. resolved.  Started on IV antibiotics secondary to enteritis and leukocytosis and possible concern for infection, CT abdomen showed enteritis. Patient received 3 days of IV antibiotics discontinue IV antibiotics as patient has no evidence of abdominal pain or leukocytosis.  Severe peripheral artery disease, of bilateral lower extremity, status post angiogram of right leg with endovascular intervention, 2 days ago, patient continued to have right leg pain and also recurrent ischemia with purple toes stores patient had repeat angiogram today, patient has recurrent thrombosis of right SFA, popliteal stents, patient also has stenosis of tibioperoneal trunk; status post catheter directed thrombolysis patient is going for right leg angiogram again by vascular.  As patient continued to have poor pulses in right foot is cool to touch. Patient is going to have right leg angiogram for the third time today. Left leg angiogram with stent to left SFA Continue thrombolytics, vascular surgery to follow the patient   history of DVT.  The patient was on Coumadin, off Coumadin for 2 weeks due to recent GI bleeding.  Continue heparin drip.  Patient has history of noncompliance with Coumadin, patient was tried on different blood thinners and her insurance did not cover the cost she was placed on Coumadin and she is not compliant with Coumadin and eating a lot of leafy green vegetables and not taking Coumadin correctly as per documentation by vascular.   Hypertension.  Continue home hypertension medication after resuming diet. COPD.  Stable.  Constipation, continue bowel regimen while patient is on narcotics.  Discussed with patient's pharmacist   Acute anemia, patient's hemoglobin is 8, trend the hemoglobin because patient already on thrombolytics, patient's hemoglobin was  7.1 this morning watch closely for need for blood transfusion if needed.  Leukocytosis, due to multiple invasive procedures will give IV antibiotics.  Received a dose of clindamycin. All the records are reviewed and case discussed with Care Management/Social Workerr. Management plans discussed with the patient, family and they are in agreement.  CODE STATUS: Full  TOTAL TIME TAKING CARE OF THIS PATIENT: 35 minutes.  More than 50% time spent in counseling, coordination of care   POSSIBLE D/C IN 1-2 DAYS, DEPENDING ON CLINICAL CONDITION.   Katha Hamming M.D on 12/22/2018   Between 7am to 6pm - Pager - [phone]  After 6pm go to [email or web address] - Social research officer, government  [facility]  Office  [phone]  CC: Primary care physician; Center, [facility]  Note: This dictation was prepared with Nurse, children's dictation along with smaller phrase technology. Any transcriptional errors that result from this process are unintentional.

## 2018-12-22 NOTE — H&P (Signed)
[facility] History & Physical Update  The patient was interviewed and re-examined.  The patient's previous History and Physical has been reviewed and is unchanged.  There is no change in the plan of care. We plan to proceed with the scheduled procedure.  Leotis Pain, MD  12/22/2018, 12:13 PM

## 2018-12-22 NOTE — Op Note (Addendum)
Sylvia [name]  Percutaneous Study/Intervention Procedural Note   Date of Surgery: 12/22/2018  Surgeon(s):Kani Jobson    Assistants:none  Pre-operative Diagnosis: PAD with rest [name] right foot  Post-operative diagnosis:  Same  Procedure(s) Performed:             1.   Aortogram and right lower extremity angiogram             2.  Catheter placement into right peroneal artery and posterior tibial arteries from left femoral approach             3.   Mechanical thrombectomy with the penumbra cat 6 device to the right common femoral artery, superficial femoral artery, popliteal artery, tibioperoneal trunk, peroneal artery, and posterior tibial arteries             4.  Percutaneous transluminal angioplasty of right posterior tibial artery with 2.5 mm diameter by 30 cm length angioplasty balloon             5.   Percutaneous transluminal angioplasty of the tibioperoneal trunk and distal popliteal artery with 4 mm diameter [name]  6.  Percutaneous transluminal angioplasty of the right common femoral artery and proximal superficial femoral artery with 5 mm diameter [name]  7.  Percutaneous transluminal angioplasty of the right external iliac artery and proximal common femoral artery with 6 mm diameter [name]  8.  Viabahn covered stent placement to the right tibioperoneal trunk and popliteal arteries with a 6 mm diameter by 25 cm length stent  9.  [name] to the right external iliac artery with 8 mm diameter by 8 cm length self-expanding stent             10.  StarClose closure device left femoral artery  EBL: 400  Contrast: 75 cc  Fluoro Time: 22 minutes  Moderate Conscious Sedation Time: approximately 75 minutes using 3 mg of Versed and 100 mcg of Fentanyl              Indications:  Patient is a 63 y.o.female with an ischemic right foot.  She has been running  on thrombolytic therapy overnight and returns for repeat angiogram to evaluate potential for limb salvage. Due to the limb threatening nature of the situation, angiogram was performed for attempted limb salvage. The patient is aware that if the procedure fails, amputation would be expected.  The patient also understands that even with successful revascularization, amputation may still be required due to the severity of the situation.  Risks and benefits are discussed and informed consent is obtained.   Procedure:  The patient was identified and appropriate procedural time out was performed.  The patient was then placed supine on the table and prepped and draped in the usual sterile fashion. Moderate conscious sedation was administered during a face to face encounter with the patient throughout the procedure with my supervision of the RN administering medicines and monitoring the patient's vital signs, pulse oximetry, telemetry and mental status throughout from the start of the procedure until the patient was taken to the recovery room.  Imaging performed through the sheath showed that the right external iliac artery had significant narrowing and potentially dissection but the common iliac artery and hypogastric artery were patent.  The common femoral artery appeared to be very narrowed with thrombus around there limiting the blood flow to both the profunda femoris and superficial femoral artery.  Using the endhole of  the lytic catheter and the tibioperoneal trunk, imaging showed that there was thrombus in the tibioperoneal trunk and proximal portions of the peroneal and posterior tibial arteries.  The penumbra cat 6 device was then brought onto the field and mechanical thrombectomy is performed from the common femoral artery down to the entire superficial femoral artery, popliteal artery, tibioperoneal trunk, and peroneal artery.  Initially a large amount of thrombus was removed but there remained minimal forward  flow.  I used a 4 mm diameter by 10 cm length [name] in the tibioperoneal trunk and popliteal artery and then a 5 mm diameter by 10 cm length [name] in the proximal SFA and common femoral artery.  These were both inflated to about 10 atm for 1 minute.  I dressed the external iliac artery with a 6 mm diameter by 10 cm length [name] inflated to 12 atm for 1 minute.  Following this, there remained greater than 50% residual stenosis in the external iliac artery and no forward flow in the SFA.  Thrombectomy again was performed and this time I advanced the thrombectomy device down into the posterior tibial artery to try to remove clot from this vessel as well.  He was treated throughout the popliteal and SFA.  The right external iliac artery stenosis was treated with an 8 mm diameter by 8 cm length life star stent postdilated with a 7 mm balloon with less than 10% residual stenosis following this.  There remained multiple large blobs of thrombus throughout the SFA and popliteal arteries as well as down the tibioperoneal trunk and both tibial vessels.  Repeat thrombectomy yielded improved results in the common femoral artery and proximal SFA, but the tibioperoneal trunk remained occluded with thrombus and there was thrombus in the distal portion of the SFA/popliteal stents.  The patient had a fibrinogen of less than 80 and an INR of 2.5 and some concern for heparin antibody so had not given heparin.  I did give a half dose of Angiomax to try to ensure that we were therapeutic in her blood thinning levels.  At this point, the worse of the thrombus was in the tibioperoneal trunk and popliteal artery.  I took a 6 mm diameter by 25 cm length via bond stent and deployed this from the tibioperoneal trunk up through the popliteal artery to cover this thrombus.  It was ballooned with a 4 mm balloon distally and a 6 mm balloon  proximally.  There still remained very poor forward flow.  I then went back down into the tibial vessels and advanced the penumbra cat 6 device and at this point the proximal portion of the peroneal artery and posterior tibial arteries were patent but there remained occlusion with sluggish flow distally.  The 2.5 mm diameter by 30 cm length angioplasty balloon was then brought onto the field and I use this to treat the posterior tibial artery from the ankle up to its origin with 2 inflations up to 10 atm for 1 minute.  Even after this, the forward flow remained very sluggish when injected through the sheath in the iliac artery.  At this point, I did not feel there was much more we could do and would hope that some of her distal perfusion would be improved with Aggrastat.  Amputation is highly likely and we may consider some sort of femoral endarterectomy for the residual common femoral disease that was present to help heal a below-knee amputation.  I am not sure she will have a distal target for a bypass, but this could be evaluated as well. I elected to terminate the procedure. The sheath was removed and StarClose closure device was deployed in the left femoral artery with excellent hemostatic result. The patient was taken to the recovery room in stable condition having tolerated the procedure well.  Findings:               Aortogram:  The right external iliac appeared to have significant narrowing and potentially dissection in this location.  The common iliac was patent and the hypogastric was patent.             Right Lower Extremity:  Continued thrombosis of the right SFA stents, with complete thrombosis throughout the right lower extremity with minimal flow seen initially.  With a catheter down to the tibioperoneal trunk, there was thrombus in the tibioperoneal trunk and the proximal portions of the peroneal and posterior tibial arteries.   Disposition: Patient was taken to the recovery room in stable  condition having tolerated the procedure well.  Complications: None  Sylvia [name] 12/22/2018 2:44 PM   This note was created with Dragon Medical transcription system. Any errors in dictation are purely unintentional.

## 2018-12-22 NOTE — Progress Notes (Signed)
Hbg resulted 6.7, order put in for 1 unit PRBCs per verbal order from Dr. Lucky Cowboy. Darlyn Chamber, NP to bedside to speak with patient in order to get consent for blood transfusion.

## 2018-12-22 NOTE — Progress Notes (Signed)
This RN went in to give pt midnight meds and do midnight assessment and saw that there was 179ml bright red blood in suction canister of pt's purewick. Upon further investigation, right fem access site found to be bleeding with saturated dressing. Dr. Lucky Cowboy paged, order received to stop alteplase and for stat CBC. Pt then expressed need to void without being able, bladder scan completed with results of >850. Foley catheter placed and clear, pale yellow urine returned. Dr. Lucky Cowboy paged and notified of same, order received to leave alteplase off for 2 hours then resume at 0.5mg /hr, and if Hgb results less than 7 to transfuse 1 unit of PRBCs. Orders read back. Dr. Lucky Cowboy also notified that lab had called with results of fibrinogen 81, results acknowledged.

## 2018-12-22 NOTE — Progress Notes (Signed)
F/U with pt post-op. Pt was alert and responsive. Ch prayed a prayer of thanksgiving with pt and sister at bedside for a successful surgery. Ch offered prayer shawl for pt.  F/U care should include spiritual support and updates from care team.    12/22/18 1700  Clinical Encounter Type  Visited With Patient and family together;Health care provider  Visit Type Follow-up;Post-op  Spiritual Encounters  Spiritual Needs Emotional;Grief support;Prayer  Stress Factors  Patient Stress Factors Health changes

## 2018-12-23 DIAGNOSIS — I998 Other disorder of circulatory system: Secondary | ICD-10-CM

## 2018-12-23 LAB — CBC WITH DIFFERENTIAL/PLATELET
Abs Immature Granulocytes: 0.11 10*3/uL — ABNORMAL HIGH (ref 0.00–0.07)
Basophils Absolute: 0 10*3/uL (ref 0.0–0.1)
Basophils Relative: 0 %
Eosinophils Absolute: 0.1 10*3/uL (ref 0.0–0.5)
Eosinophils Relative: 1 %
HCT: 22.8 % — ABNORMAL LOW (ref 36.0–46.0)
Hemoglobin: 7.6 g/dL — ABNORMAL LOW (ref 12.0–15.0)
Immature Granulocytes: 1 %
Lymphocytes Relative: 8 %
Lymphs Abs: 1.1 10*3/uL (ref 0.7–4.0)
MCH: 28.5 pg (ref 26.0–34.0)
MCHC: 33.3 g/dL (ref 30.0–36.0)
MCV: 85.4 fL (ref 80.0–100.0)
Monocytes Absolute: 1.3 10*3/uL — ABNORMAL HIGH (ref 0.1–1.0)
Monocytes Relative: 10 %
Neutro Abs: 11 10*3/uL — ABNORMAL HIGH (ref 1.7–7.7)
Neutrophils Relative %: 80 %
Platelets: 180 10*3/uL (ref 150–400)
RBC: 2.67 MIL/uL — ABNORMAL LOW (ref 3.87–5.11)
RDW: 15.7 % — ABNORMAL HIGH (ref 11.5–15.5)
WBC: 13.7 10*3/uL — ABNORMAL HIGH (ref 4.0–10.5)
nRBC: 0 % (ref 0.0–0.2)

## 2018-12-23 LAB — BASIC METABOLIC PANEL
Anion gap: 10 (ref 5–15)
BUN: 36 mg/dL — ABNORMAL HIGH (ref 8–23)
CO2: 19 mmol/L — ABNORMAL LOW (ref 22–32)
Calcium: 7.9 mg/dL — ABNORMAL LOW (ref 8.9–10.3)
Chloride: 112 mmol/L — ABNORMAL HIGH (ref 98–111)
Creatinine, Ser: 2.2 mg/dL — ABNORMAL HIGH (ref 0.44–1.00)
GFR calc Af Amer: 27 mL/min — ABNORMAL LOW (ref >60)
GFR calc non Af Amer: 23 mL/min — ABNORMAL LOW (ref >60)
Glucose, Bld: 178 mg/dL — ABNORMAL HIGH (ref 70–99)
Potassium: 4.5 mmol/L (ref 3.5–5.1)
Sodium: 141 mmol/L (ref 135–145)

## 2018-12-23 LAB — GLUCOSE, CAPILLARY
Glucose-Capillary: 153 mg/dL — ABNORMAL HIGH (ref 70–99)
Glucose-Capillary: 175 mg/dL — ABNORMAL HIGH (ref 70–99)
Glucose-Capillary: 102 mg/dL — ABNORMAL HIGH (ref 70–99)
Glucose-Capillary: 109 mg/dL — ABNORMAL HIGH (ref 70–99)

## 2018-12-23 LAB — MRSA PCR SCREENING: MRSA by PCR: NEGATIVE

## 2018-12-23 LAB — PROTIME-INR
INR: 2.2 — ABNORMAL HIGH (ref 0.8–1.2)
Prothrombin Time: 24.1 seconds — ABNORMAL HIGH (ref 11.4–15.2)

## 2018-12-23 LAB — CK: Total CK: 2617 U/L — ABNORMAL HIGH (ref 38–234)

## 2018-12-23 MED ORDER — SODIUM CHLORIDE 0.9 % IV SOLN
2.0000 g | Freq: Once | INTRAVENOUS | Status: AC
  Administered 2018-12-23: 2 g via INTRAVENOUS
  Filled 2018-12-23 (×2): qty 2

## 2018-12-23 MED ORDER — SODIUM CHLORIDE 0.9 % IV SOLN
1.0000 g | Freq: Three times a day (TID) | INTRAVENOUS | Status: DC
  Administered 2018-12-24 – 2018-12-30 (×19): 1 g via INTRAVENOUS
  Filled 2018-12-23 (×25): qty 1

## 2018-12-23 MED ORDER — VANCOMYCIN HCL 10 G IV SOLR
1750.0000 mg | Freq: Once | INTRAVENOUS | Status: AC
  Administered 2018-12-23: 1750 mg via INTRAVENOUS
  Filled 2018-12-23: qty 1750

## 2018-12-23 NOTE — Plan of Care (Signed)
  Problem: Education: Goal: Knowledge of General Education information will improve Description: Including pain rating scale, medication(s)/side effects and non-pharmacologic comfort measures Outcome: Progressing   Problem: Education: Goal: Ability to describe self-care measures that may prevent or decrease complications (Diabetes Survival Skills Education) will improve Outcome: Progressing Goal: Individualized Educational Video(s) Outcome: Progressing   Problem: Coping: Goal: Ability to adjust to condition or change in health will improve Outcome: Progressing   Problem: Fluid Volume: Goal: Ability to maintain a balanced intake and output will improve Outcome: Progressing   Problem: Health Behavior/Discharge Planning: Goal: Ability to identify and utilize available resources and services will improve Outcome: Progressing Goal: Ability to manage health-related needs will improve Outcome: Progressing   Problem: Metabolic: Goal: Ability to maintain appropriate glucose levels will improve Outcome: Progressing   Problem: Nutritional: Goal: Maintenance of adequate nutrition will improve Outcome: Progressing Goal: Progress toward achieving an optimal weight will improve Outcome: Progressing   Problem: Skin Integrity: Goal: Risk for impaired skin integrity will decrease Outcome: Progressing   Problem: Tissue Perfusion: Goal: Adequacy of tissue perfusion will improve Outcome: Progressing   

## 2018-12-23 NOTE — Consult Note (Addendum)
Pharmacy Antibiotic Note  Sylvia [name] is a 63 y.o. female admitted on 12/14/2018 with fever.  Pharmacy has been consulted for aztreonam and vancomycin dosing.  Plan: Will give aztreonam 2 g load followed by 1 g q8H.   Vancomycin 1750 mg x 1 loading dose. Will dose by levels and order a 24 hour random level. Scr 0.79 > 1.38>2.20.   Height: 5\' 7"  (170.2 cm) Weight: 152 lb 12.8 oz (69.3 kg) IBW/kg (Calculated) : 61.6  Temp (24hrs), Avg:99.1 F (37.3 C), Min:98.2 F (36.8 C), Max:103 F (39.4 C)  Recent Labs  Lab 12/18/18 0432  12/20/18 0320 12/21/18 0448  12/21/18 1533 12/22/18 0105 12/22/18 0625 12/22/18 2033 12/23/18 0344  WBC 7.1   < > 10.6* 9.8   < > 15.4* 13.5* 13.1* 16.5* 13.7*  CREATININE 0.87  --  0.73 0.79  --   --   --  1.38*  --  2.20*   < > = values in this interval not displayed.    Estimated Creatinine Clearance: 25.1 mL/min (A) (by C-G formula based on SCr of 2.2 mg/dL (H)).    Allergies  Allergen Reactions  . Penicillins Hives    Has patient had a PCN reaction causing immediate rash, facial/tongue/throat swelling, SOB or lightheadedness with hypotension: Yes Has patient had a PCN reaction causing severe rash involving mucus membranes or skin necrosis: No Has patient had a PCN reaction that required hospitalization: No Has patient had a PCN reaction occurring within the last 10 years: No If all of the above answers are "NO", then may proceed with Cephalosporin use.  . Tramadol Itching    Antimicrobials this admission: 9/5 pip/tazo >>  9/5 vancomycin >>   Dose adjustments this admission: None  Microbiology results: 9/5 BCx: pending  Thank you for allowing pharmacy to be a part of this patient's care.  Oswald Hillock, PharmD, BCPS 12/23/2018 3:07 PM

## 2018-12-23 NOTE — Progress Notes (Addendum)
[place] [facility]  Daily Progress Note   Assessment/Planning:   s/p B multiple endovascular interventions   Fibrinogen is precariously low.  If any clinical bleeding, transfuse cryo  Pt is anticoagulated at this point with INR 2.2  R foot is cool and minimally mobile, I suspect tibial arteries are near occluded if not already occluded  R arterial duplex ordered to verify  Cr elevated to 2.2 from normal baseline.  Unclear if this is due to CIN or myoglobin release from dead muscle  Will check myoglobin  Cont MIVF  BP marginal and H/H low.  Consider transfusion   Subjective  - 1 Day Post-Op   No pain in R foot   Objective   Vitals:   12/23/18 0400 12/23/18 0500 12/23/18 0600 12/23/18 0800  BP: (!) 99/54 (!) 116/54 (!) 97/57 (!) 115/57  Pulse: (!) 103 (!) 105 (!) 104 (!) 103  Resp: (!) 23 19 20  (!) 22  Temp: 98.9 F (37.2 C)     TempSrc:      SpO2: 100% 100% 100% 100%  Weight:      Height:         Intake/Output Summary (Last 24 hours) at 12/23/2018 1023 Last data filed at 12/23/2018 0500 Gross per 24 hour  Intake 2275.77 ml  Output 600 ml  Net 1675.77 ml    PULM  CTAB  CV  RR, tachycardiac  GI  soft, NTND  VASC B groin: local compression device on both groins, no frank bleeding or echymosis,  R leg warm down to knee, cold from mid-calf distally, no dopperable signals L leg warm all the way down to L foot, faintly dopplerable damp mono peroneal and AT  NEURO Minimal motor in lower leg    Laboratory   CBC CBC Latest Ref Rng & Units 12/23/2018 12/22/2018 12/22/2018  WBC 4.0 - 10.5 K/uL 13.7(H) 16.5(H) 13.1(H)  Hemoglobin 12.0 - 15.0 g/dL 7.6(L) 6.9(L) 7.9(L)  Hematocrit 36.0 - 46.0 % 22.8(L) 20.7(L) 23.5(L)  Platelets 150 - 400 K/uL 180 196 147(L)    BMET    Component Value Date/Time   NA 141 12/23/2018 0344   K 4.5 12/23/2018 0344   CL 112 (H) 12/23/2018 0344   CO2 19 (L) 12/23/2018 0344   GLUCOSE 178 (H) 12/23/2018 0344   BUN 36 (H)  12/23/2018 0344   CREATININE 2.20 (H) 12/23/2018 0344   CALCIUM 7.9 (L) 12/23/2018 0344   GFRNONAA 23 (L) 12/23/2018 0344   GFRAA 27 (L) 12/23/2018 0344     Adele Barthel, MD, FACS [facility]  12/23/2018, 10:23 AM

## 2018-12-23 NOTE — Progress Notes (Signed)
[employer] [facility] - [place] [facility]   PATIENT NAME: Sylvia [name]    MR#:  [id number]  DATE OF BIRTH:  [birth year]  Repeat angiogram of right leg yesterday.  Patient is on diuretics now, still has severe right foot is slightly warm to touch.  CHIEF COMPLAINT:   Chief Complaint  Patient presents with  . Abdominal Pain  Right leg pain, REVIEW OF SYSTEMS:  CONSTITUTIONAL: No fever, fatigue or weakness.  EYES: No blurred or double vision.  EARS, NOSE, AND THROAT: No tinnitus or ear pain.  RESPIRATORY: No cough, shortness of breath, wheezing or hemoptysis.  CARDIOVASCULAR: No chest pain, orthopnea, edema.  GASTROINTESTINAL: No nausea or vomiting.  Has constipation.  GENITOURINARY: No dysuria, hematuria.  ENDOCRINE: No polyuria, nocturia,  HEMATOLOGY: No anemia, easy bruising or bleeding SKIN:  severe right leg pain   pSYCHIATRY: No anxiety or depression.  Right leg pain, swelling, cold to touch.   DRUG ALLERGIES:   Allergies  Allergen Reactions  . Penicillins Hives    Has patient had a PCN reaction causing immediate rash, facial/tongue/throat swelling, SOB or lightheadedness with hypotension: Yes Has patient had a PCN reaction causing severe rash involving mucus membranes or skin necrosis: No Has patient had a PCN reaction that required hospitalization: No Has patient had a PCN reaction occurring within the last 10 years: No If all of the above answers are "NO", then may proceed with Cephalosporin use.  . Tramadol Itching    VITALS:  Blood pressure (!) 115/57, pulse (!) 103, temperature 98.9 F (37.2 C), resp. rate (!) 22, height 5\' 7"  (1.702 m), weight 69.3 kg, SpO2 100 %.  PHYSICAL EXAMINATION:  GENERAL:  63 y.o.-year-old patient lying in the bed with no acute distress.  EYES: Pupils equal, round, reactive to light and accommodation. No scleral icterus. Extraocular muscles intact.  HEENT: Head atraumatic, normocephalic. Oropharynx and nasopharynx clear.   NECK:  Supple, no jugular venous distention. No thyroid enlargement, no tenderness.  LUNGS: Normal breath sounds bilaterally, no wheezing, rales,rhonchi or crepitation. No use of accessory muscles of respiration.  CARDIOVASCULAR: S1, S2 normal. No murmurs, rubs, or gallops.  ABDOMEN: Soft, nontender, nondistended. Bowel sounds present. No organomegaly or mass.  EXTREMITIES: Patient noted to have skin ulcers on the bottom of right great toe, right little toe, right foot is slightly warm to touch today. NEUROLOGIC: Cranial nerves II through XII are intact. Muscle strength 4/5 in all extremities. Sensation intact. Gait not checked.  PSYCHIATRIC: The patient is alert and oriented x 3.  SKIN: Right leg pain, swelling, discoloration of the right great toe, fourth toe LABORATORY PANEL:   CBC Recent Labs  Lab 12/23/18 0344  WBC 13.7*  HGB 7.6*  HCT 22.8*  PLT 180   ------------------------------------------------------------------------------------------------------------------  Chemistries  Recent Labs  Lab 12/20/18 0320  12/23/18 0344  NA 141   < > 141  K 3.7   < > 4.5  CL 109   < > 112*  CO2 24   < > 19*  GLUCOSE 120*   < > 178*  BUN 11   < > 36*  CREATININE 0.73   < > 2.20*  CALCIUM 8.8*   < > 7.9*  MG 1.7  --   --    < > = values in this interval not displayed.   ------------------------------------------------------------------------------------------------------------------  Cardiac Enzymes No results for input(s): TROPONINI in the last 168 hours. ------------------------------------------------------------------------------------------------------------------  RADIOLOGY:  Dg Abd 1 View  Result Date: 12/22/2018 CLINICAL DATA:  Constipation EXAM: ABDOMEN - 1 VIEW COMPARISON:  CT dated December 14, 2018 FINDINGS: There is an average amount of stool throughout the colon. The bowel gas pattern is nonspecific and nonobstructive. Vascular stents are again noted. There is no  evidence for an acute osseous abnormality. IMPRESSION: Negative. Electronically Signed   By: Constance Holster M.D.   On: 12/22/2018 22:28    ASSESSMENT AND PLAN:   Active Problems:   DKA (diabetic ketoacidoses) ([place])  DKA with history of diabetes 2. resolved with IV hydration,   acute gastroenteritis. resolved.   finished IV antibiotics.   Severe PAD with bilateral wrist pain status post left leg angiogram, multiple right lower extremity angiograms, vascular surgery is following, continue pain medicines with morphine, oxycodone, Toradol, continue thrombolytic therapy, patient had right leg angiogram for 3 times since this hospitalization.  Continue statins,   History of DVT.  The patient was on Coumadin, off Coumadin for 2 weeks due to recent GI bleeding.  Continue heparin drip.  Patient has history of noncompliance with Coumadin, patient was tried on different blood thinners and her insurance did not cover the cost she was placed on Coumadin and she is not compliant with Coumadin and eating a lot of leafy green vegetables and not taking Coumadin correctly as per documentation by vascular. Acute blood loss anemia, monitor closely, transfuse if hemoglobin less than 7.  Continue thrombolytic therapy secondary to PAD, multiple angiograms with multiple interventions for the right leg,  Hypertension.  Continue home uncontrolled diabetes mellitus type 2: With PAD ; Tradjenta, sliding scale insulin with coverage.  Constipation, continue bowel regimen while patient is on narcotics.  Discussed with patient's pharmacist  Acute kidney injury, creatinine went up from 1.38-2.2.  Likely contrast induced renal failure.   Leukocytosis improving, white count trended down from 16-13.   Constipation secondary to narcotics, continue bowel regimen.    All the records are reviewed and case discussed with Care Management/Social Workerr. Management plans discussed with the patient, family and they are in  agreement.  CODE STATUS: Full  TOTAL TIME TAKING CARE OF THIS PATIENT: 35 minutes.  More than 50% time spent in counseling, coordination of care   POSSIBLE D/C IN 1-2 DAYS, DEPENDING ON CLINICAL CONDITION.   Epifanio Lesches M.D on 12/23/2018   Between 7am to 6pm - Pager - [phone]  After 6pm go to [email or web address] - password [email or web address] [facility]  Office  [phone]  CC: Primary care physician; Center, [place]  Note: This dictation was prepared with Diplomatic Services operational officer dictation along with smaller phrase technology. Any transcriptional errors that result from this process are unintentional.

## 2018-12-23 NOTE — Progress Notes (Signed)
Shift summary:  - Assumed care of patient @ 1500 hrs. At which time the patient was still receiving Aggrastat infusion which had been DC'd at 1000 hrs this AM. Dr. Bridgett Larsson notified, no new orders at this time as patient has no obvious signs of bleeding.

## 2018-12-23 NOTE — Progress Notes (Signed)
CC  Follow up leg ischemia   HPI S/p angiogram Aortogram: The right external iliac appeared to have significant narrowing and potentially dissection in this location.  The common iliac was patent and the hypogastric was patent. 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  With a catheter down to the tibioperoneal trunk, there was thrombus in the tibioperoneal trunk and the proximal portions of the peroneal and posterior tibial arteries   Plan for amputation if patient does NOT improve Follow up vasc surgery recs Follow up H/H transfuse as needed   VITALS: BP (!) 97/57   Pulse (!) 104   Temp 98.9 F (37.2 C)   Resp 20   Ht 5\' 7"  (1.702 m)   Wt 69.3 kg   SpO2 100%   BMI 23.93 kg/m     I/O last 3 completed shifts: In: 3141.5 [I.V.:1907.6; Blood:360; Other:430; IV Piggyback:443.8] Out: 1525 [Urine:1525] No intake/output data recorded.  SpO2: 100 % O2 Flow Rate (L/min): 2 L/min FiO2 (%): 28 %  CBC    Component Value Date/Time   WBC 13.7 (H) 12/23/2018 0344   RBC 2.67 (L) 12/23/2018 0344   HGB 7.6 (L) 12/23/2018 0344   HCT 22.8 (L) 12/23/2018 0344   PLT 180 12/23/2018 0344   MCV 85.4 12/23/2018 0344   MCH 28.5 12/23/2018 0344   MCHC 33.3 12/23/2018 0344   RDW 15.7 (H) 12/23/2018 0344   LYMPHSABS 1.1 12/23/2018 0344   MONOABS 1.3 (H) 12/23/2018 0344   EOSABS 0.1 12/23/2018 0344   BASOSABS 0.0 12/23/2018 0344   BMP Latest Ref Rng & Units 12/23/2018 12/22/2018 12/21/2018  Glucose 70 - 99 mg/dL 178(H) 192(H) 148(H)  BUN 8 - 23 mg/dL 36(H) 18 13  Creatinine 0.44 - 1.00 mg/dL 2.20(H) 1.38(H) 0.79  Sodium 135 - 145 mmol/L 141 141 141  Potassium 3.5 - 5.1 mmol/L 4.5 3.5 3.7  Chloride 98 - 111 mmol/L 112(H) 114(H) 111  CO2 22 - 32 mmol/L 19(L) 19(L) 24  Calcium 8.9 - 10.3 mg/dL 7.9(L) 7.3(L) 8.9      Review of Systems:  Gen:  Denies   fever, sweats, chills weigh loss  HEENT: Denies blurred vision, double vision, ear pain, eye pain, hearing loss, nose bleeds, sore throat Cardiac:  No dizziness, chest pain or heaviness, chest tightness,edema, No JVD Resp:   No cough, -sputum production, -shortness of breath,-wheezing, -hemoptysis,  Ext:    +pain Rt leg Other:  All other systems negative  Physical Examination:   GENERAL:NAD, no fevers, chills, no weakness no fatigue HEAD: Normocephalic, atraumatic.  EYES: PERLA, EOMI No scleral icterus.  MOUTH: Moist mucosal membrane.  EAR, NOSE, THROAT: Clear without exudates. No external lesions.  NECK: Supple.  PULMONARY: CTA B/L no wheezing, rhonchi, crackles CARDIOVASCULAR: S1 and S2. Regular rate and rhythm. No murmurs GASTROINTESTINAL: Soft, nontender, nondistended. Positive bowel sounds.  MUSCULOSKELETAL: No swelling, clubbing, or edema.  NEUROLOGIC: No gross focal neurological deficits. 5/5 strength all extremities SKIN:+ ulceration,  PSYCHIATRIC: Insight, judgment intact. -depression -anxiety ALL OTHER ROS ARE NEGATIVE    I personally reviewed Labs under Results section.   MEDICATIONS: I have reviewed all medications and confirmed regimen as documented   CULTURE RESULTS   Recent Results (from the past 240 hour(s))  SARS Coronavirus 2 [facility] order, Performed in [facility] hospital lab) Nasopharyngeal Nasopharyngeal Swab     Status: None   Collection Time: 12/14/18  3:11 PM  Specimen: Nasopharyngeal Swab  Result Value Ref Range Status   SARS Coronavirus 2 NEGATIVE NEGATIVE Final    Comment: (NOTE) If result is NEGATIVE SARS-CoV-2 target nucleic acids are NOT DETECTED. The SARS-CoV-2 RNA is generally detectable in upper and lower  respiratory specimens during the acute phase of infection. The lowest  concentration of SARS-CoV-2 viral copies this assay can detect is 250  copies / mL. A negative result does not preclude SARS-CoV-2 infection  and should not be  used as the sole basis for treatment or other  patient management decisions.  A negative result may occur with  improper specimen collection / handling, submission of specimen other  than nasopharyngeal swab, presence of viral mutation(s) within the  areas targeted by this assay, and inadequate number of viral copies  (<250 copies / mL). A negative result must be combined with clinical  observations, patient history, and epidemiological information. If result is POSITIVE SARS-CoV-2 target nucleic acids are DETECTED. The SARS-CoV-2 RNA is generally detectable in upper and lower  respiratory specimens dur ing the acute phase of infection.  Positive  results are indicative of active infection with SARS-CoV-2.  Clinical  correlation with patient history and other diagnostic information is  necessary to determine patient infection status.  Positive results do  not rule out bacterial infection or co-infection with other viruses. If result is PRESUMPTIVE POSTIVE SARS-CoV-2 nucleic acids MAY BE PRESENT.   A presumptive positive result was obtained on the submitted specimen  and confirmed on repeat testing.  While 2019 novel coronavirus  (SARS-CoV-2) nucleic acids may be present in the submitted sample  additional confirmatory testing may be necessary for epidemiological  and / or clinical management purposes  to differentiate between  SARS-CoV-2 and other Sarbecovirus currently known to infect humans.  If clinically indicated additional testing with an alternate test  methodology [phone]) is advised. The SARS-CoV-2 RNA is generally  detectable in upper and lower respiratory sp ecimens during the acute  phase of infection. The expected result is Negative. Fact Sheet for Patients:  BoilerBrush.com.cy Fact Sheet for Healthcare Providers: https://pope.com/ This test is not yet approved or cleared by the [place] FDA and has been authorized  for detection and/or diagnosis of SARS-CoV-2 by FDA under an Emergency Use Authorization (EUA).  This EUA will remain in effect (meaning this test can be used) for the duration of the COVID-19 declaration under Section 564(b)(1) of the Act, 21 U.S.C. section 360bbb-3(b)(1), unless the authorization is terminated or revoked sooner. Performed at [facility], [address] [place], [place] [address]           IMAGING    Dg Abd 1 View  Result Date: 12/22/2018 CLINICAL DATA:  Constipation EXAM: ABDOMEN - 1 VIEW COMPARISON:  CT dated December 14, 2018 FINDINGS: There is an average amount of stool throughout the colon. The bowel gas pattern is nonspecific and nonobstructive. Vascular stents are again noted. There is no evidence for an acute osseous abnormality. IMPRESSION: Negative. Electronically Signed   By: Katherine Mantle M.D.   On: 12/22/2018 22:28        ASSESSMENT AND PLAN SYNOPSIS  Severe PAD with bilateral rest pain s/p left lower extremity angiogram and multiple right lower extremity angiograms  Hypertension  Prn morphine, toradol, and oxycodone for pain management -undergo thrombolytic therapy overnight 09/3 with return to special procedures on 09/4 for 3rd RLE angiogram in an attempt at limb salvage  Prn hydralazine for bp management Continuous telemetry monitoring  Leukocytosis  Trend WBC and monitor fever curve   Acute blood loss anemia  Continue thrombolytic therapy per vascular surgery recommendations  Trend CBC, coags, and fibrinogen levels Monitor for s/sx of bleeding Transfuse for hgb <7 and/or active signs of bleeding   Uncontrolled Diabetes Mellitus  CBG's ac/hs Continue scheduled tradjenta and SSI    Bettyanne Dittman Santiago Glad, M.D.  [facility]  Medical Director [facility] Medical Director [facility] Cardio-Pulmonary Department

## 2018-12-23 NOTE — Consult Note (Signed)
[facility] CONSULT NOTE    Date: 12/23/2018                  Patient Name:  Sylvia [name]  MRN: [id number]  DOB: 09/16/1955  Age / Sex: [age] y.o., female         [facility]                 Service Requesting Consult:  Critical care                 Reason for Consult:  Acute renal failure in the setting of contrast exposure            History of Present Illness: Patient is a 63 y.o. female with a PMHx of diabetes mellitus type 2, COPD, GERD, hypertension, peripheral vascular disease who was admitted to [facility] on 12/14/2018 for evaluation of abdominal pain, nausea, vomiting, and diarrhea.  Unfortunately she developed worsening peripheral arterial disease over the course of this admission.  Patient has had multiple angiograms with intervention over this admission.  During this admission creatinine has been as low as 0.8.  However now creatinine up to 2.2.  Patient also has evidence of metabolic acidosis with most recent serum bicarbonate of 19.  She is noted to be on heparin drip.   Medications: Outpatient medications: Medications Prior to Admission  Medication Sig Dispense Refill Last Dose  . albuterol (PROVENTIL HFA;VENTOLIN HFA) 108 (90 Base) MCG/ACT inhaler Inhale 2 puffs into the lungs every 6 (six) hours as needed for wheezing or shortness of breath. 1 Inhaler 2 prn at prn  . alum & mag hydroxide-simeth (MAALOX/MYLANTA) 200-200-20 MG/5ML suspension Take 30 mLs by mouth every 4 (four) hours as needed for indigestion or heartburn. 355 mL 0 prn at prn  . atorvastatin (LIPITOR) 10 MG tablet Take 1 tablet (10 mg total) by mouth daily. 90 tablet 3 12/13/2018 at Unknown time  . Biotin 1000 MCG tablet Take 1,000 tablets by mouth daily.    12/13/2018 at Unknown time  . cetirizine (ZYRTEC) 10 MG tablet Take 10 mg by mouth daily.   12/13/2018 at Unknown time  . cholecalciferol (VITAMIN D3) 25 MCG (1000 UT) tablet Take 1,000 Units by mouth  daily.   12/13/2018 at Unknown time  . iron polysaccharides (NIFEREX) 150 MG capsule Take 1 capsule (150 mg total) by mouth daily. 30 capsule 3 12/13/2018 at Unknown time  . Multiple Vitamins-Minerals (WOMENS MULTIVITAMIN PO) Take 1 tablet by mouth daily.   12/13/2018 at Unknown time  . pantoprazole (PROTONIX) 40 MG tablet Take 1 tablet (40 mg total) by mouth daily. 30 tablet 0 12/13/2018 at Unknown time  . sitaGLIPtin (JANUVIA) 100 MG tablet Take 100 mg by mouth daily.   12/13/2018 at Unknown time  . vitamin C (ASCORBIC ACID) 500 MG tablet Take 500 mg by mouth daily.   12/13/2018 at Unknown time  . warfarin (COUMADIN) 5 MG tablet Take 1 tablet (5 mg total) by mouth daily at 6 PM. 30 tablet 5 12/13/2018 at Unknown time    Current medications: Current Facility-Administered Medications  Medication Dose Route Frequency Provider Last Rate Last Dose  . 0.9 %  sodium chloride infusion   Intravenous Continuous Annice Needyew, Jason S, MD 75 mL/hr at 12/23/18 1628    . ALPRAZolam Prudy Feeler(XANAX) tablet 0.25 mg  0.25 mg Oral TID PRN Annice Needyew, Jason S, MD   0.25 mg at 12/22/18 0913  . atorvastatin (LIPITOR) tablet 10 mg  10 mg Oral Daily Annice Needy, MD   10 mg at 12/22/18 1720  . aztreonam (AZACTAM) 1 g in sodium chloride 0.9 % 100 mL IVPB  1 g Intravenous Q8H Katha Hamming, MD      . aztreonam (AZACTAM) 2 g in sodium chloride 0.9 % 100 mL IVPB  2 g Intravenous Once Katha Hamming, MD      . bisacodyl (DULCOLAX) EC tablet 5 mg  5 mg Oral BID PRN Annice Needy, MD   5 mg at 12/20/18 2120  . Chlorhexidine Gluconate Cloth 2 % PADS 6 each  6 each Topical Daily Annice Needy, MD   6 each at 12/21/18 1500  . diphenhydrAMINE (BENADRYL) injection 12.5 mg  12.5 mg Intravenous Q6H PRN Eugenie Norrie, NP       Or  . diphenhydrAMINE (BENADRYL) 12.5 MG/5ML elixir 12.5 mg  12.5 mg Oral Q6H PRN Eugenie Norrie, NP      . docusate sodium (COLACE) capsule 100 mg  100 mg Oral BID Annice Needy, MD   100 mg at 12/23/18 1011  .  hydrALAZINE (APRESOLINE) injection 10-20 mg  10-20 mg Intravenous Q4H PRN Annice Needy, MD   20 mg at 12/21/18 2343  . HYDROmorphone (DILAUDID) 1 mg/mL PCA injection   Intravenous Q4H Eugenie Norrie, NP   25 mg at 12/22/18 1927  . insulin aspart (novoLOG) injection 0-5 Units  0-5 Units Subcutaneous QHS Annice Needy, MD   2 Units at 12/17/18 2132  . insulin aspart (novoLOG) injection 0-9 Units  0-9 Units Subcutaneous TID WC Annice Needy, MD   2 Units at 12/23/18 1241  . iron polysaccharides (NIFEREX) capsule 150 mg  150 mg Oral Daily Annice Needy, MD   150 mg at 12/23/18 1019  . linagliptin (TRADJENTA) tablet 5 mg  5 mg Oral Daily Annice Needy, MD   5 mg at 12/23/18 1019  . metoCLOPramide (REGLAN) tablet 5 mg  5 mg Oral TID AC Annice Needy, MD   5 mg at 12/23/18 1633  . multivitamin with minerals tablet 1 tablet  1 tablet Oral Daily Annice Needy, MD   1 tablet at 12/23/18 1010  . naloxone [facility]) injection 0.4 mg  0.4 mg Intravenous PRN Eugenie Norrie, NP       And  . sodium chloride flush (NS) 0.9 % injection 9 mL  9 mL Intravenous PRN Eugenie Norrie, NP      . ondansetron (ZOFRAN) injection 4 mg  4 mg Intravenous Q6H PRN Annice Needy, MD   4 mg at 12/21/18 1612  . ondansetron (ZOFRAN) injection 4 mg  4 mg Intravenous Q6H PRN Annice Needy, MD      . oxyCODONE (Oxy IR/ROXICODONE) immediate release tablet 10 mg  10 mg Oral Q4H PRN Annice Needy, MD   10 mg at 12/22/18 1529  . oxyCODONE (Oxy IR/ROXICODONE) immediate release tablet 5 mg  5 mg Oral Q4H PRN Annice Needy, MD      . pantoprazole (PROTONIX) EC tablet 40 mg  40 mg Oral Daily Annice Needy, MD   40 mg at 12/23/18 1010  . polyethylene glycol (MIRALAX / GLYCOLAX) packet 17 g  17 g Oral Daily Annice Needy, MD   17 g at 12/23/18 1011  . senna (SENOKOT) tablet 17.2 mg  2 tablet Oral Daily Annice Needy, MD   17.2 mg at 12/23/18 1010  . sodium  chloride flush (NS) 0.9 % injection 3 mL  3 mL Intravenous Q12H Algernon Huxley, MD   Stopped at 12/22/18  1100  . vancomycin (VANCOCIN) 1,750 mg in sodium chloride 0.9 % 500 mL IVPB  1,750 mg Intravenous Once Flora Lipps, MD 250 mL/hr at 12/23/18 1632 1,750 mg at 12/23/18 1632      Allergies: Allergies  Allergen Reactions  . Penicillins Hives    Has patient had a PCN reaction causing immediate rash, facial/tongue/throat swelling, SOB or lightheadedness with hypotension: Yes Has patient had a PCN reaction causing severe rash involving mucus membranes or skin necrosis: No Has patient had a PCN reaction that required hospitalization: No Has patient had a PCN reaction occurring within the last 10 years: No If all of the above answers are "NO", then may proceed with Cephalosporin use.  . Tramadol Itching      Past Medical History: Past Medical History:  Diagnosis Date  . Anxiety    h/o  . COPD (chronic obstructive pulmonary disease) ([place])   . Diabetes mellitus without complication ([place])   . GERD (gastroesophageal reflux disease)   . Hypertension    bp under control-off meds since 2019     Past Surgical History: Past Surgical History:  Procedure Laterality Date  . ABDOMINAL HYSTERECTOMY    . CHOLECYSTECTOMY    . ESOPHAGOGASTRODUODENOSCOPY (EGD) WITH PROPOFOL N/A 06/15/2018   Procedure: ESOPHAGOGASTRODUODENOSCOPY (EGD) WITH PROPOFOL;  Surgeon: Toledo, Benay Pike, MD;  Location: [facility] ENDOSCOPY;  Service: Gastroenterology;  Laterality: N/A;  . LOWER EXTREMITY ANGIOGRAPHY Right 08/21/2018   Procedure: LOWER EXTREMITY ANGIOGRAPHY;  Surgeon: Algernon Huxley, MD;  Location: [place] [facility];  Service: Cardiovascular;  Laterality: Right;  . LOWER EXTREMITY ANGIOGRAPHY Left 08/28/2018   Procedure: LOWER EXTREMITY ANGIOGRAPHY;  Surgeon: Algernon Huxley, MD;  Location: [place] [facility];  Service: Cardiovascular;  Laterality: Left;  . LOWER EXTREMITY ANGIOGRAPHY Right 08/28/2018   Procedure: Lower Extremity Angiography;  Surgeon: Algernon Huxley, MD;  Location: [place] [facility];  Service:  Cardiovascular;  Laterality: Right;  . LOWER EXTREMITY ANGIOGRAPHY Right 12/18/2018   Procedure: Lower Extremity Angiography;  Surgeon: Algernon Huxley, MD;  Location: [place] [facility];  Service: Cardiovascular;  Laterality: Right;  . LOWER EXTREMITY ANGIOGRAPHY Right 12/18/2018   Procedure: Lower Extremity Angiography;  Surgeon: Algernon Huxley, MD;  Location: [place] [facility];  Service: Cardiovascular;  Laterality: Right;  . LOWER EXTREMITY ANGIOGRAPHY Left 12/21/2018   Procedure: Lower Extremity Angiography;  Surgeon: Algernon Huxley, MD;  Location: [place] [facility];  Service: Cardiovascular;  Laterality: Left;  . LOWER EXTREMITY ANGIOGRAPHY Right 12/21/2018   Procedure: Lower Extremity Angiography;  Surgeon: Algernon Huxley, MD;  Location: [place] [facility];  Service: Cardiovascular;  Laterality: Right;     Family History: Family History  Problem Relation Age of Onset  . Breast cancer Mother 23     Social History: Social History   Socioeconomic History  . Marital status: Divorced    Spouse name: Not on file  . Number of children: Not on file  . Years of education: Not on file  . Highest education level: Not on file  Occupational History  . Not on file  Social Needs  . Financial resource strain: Not hard at all  . Food insecurity    Worry: Sometimes true    Inability: Sometimes true  . Transportation needs    Medical: No    Non-medical: No  Tobacco Use  .  Smoking status: Former Smoker    Packs/day: 0.25    Years: 45.00    Pack years: 11.25    Types: Cigarettes    Quit date: 08/28/2018    Years since quitting: 0.3  . Smokeless tobacco: Never Used  . Tobacco comment: quit  Substance and Sexual Activity  . Alcohol use: No    Frequency: Never  . Drug use: Never  . Sexual activity: Not Currently  Lifestyle  . Physical activity    Days per week: 4 days    Minutes per session: 20 min  . Stress: Only a little  Relationships  . Social Musicianconnections    Talks on phone:  Patient refused    Gets together: Patient refused    Attends religious service: Patient refused    Active member of club or organization: Patient refused    Attends meetings of clubs or organizations: Patient refused    Relationship status: Patient refused  . Intimate partner violence    Fear of current or ex partner: Patient refused    Emotionally abused: Patient refused    Physically abused: Patient refused    Forced sexual activity: Patient refused  Other Topics Concern  . Not on file  Social History Narrative  . Not on file     Review of Systems: Review of Systems  Constitutional: Positive for malaise/fatigue. Negative for chills and fever.  HENT: Negative for congestion, hearing loss and tinnitus.   Eyes: Negative for blurred vision and double vision.  Respiratory: Negative for cough, hemoptysis and sputum production.   Cardiovascular: Negative for chest pain, palpitations and orthopnea.  Gastrointestinal: Positive for abdominal pain, diarrhea, nausea and vomiting. Negative for heartburn.  Genitourinary: Negative for dysuria, frequency and urgency.  Musculoskeletal: Positive for myalgias. Negative for joint pain.  Skin: Negative for itching and rash.  Neurological: Positive for weakness. Negative for dizziness and speech change.  Endo/Heme/Allergies: Negative for polydipsia. Does not bruise/bleed easily.  Psychiatric/Behavioral: Negative for depression. The patient is nervous/anxious.      Vital Signs: Blood pressure 130/90, pulse (!) 132, temperature (!) 101.8 F (38.8 C), temperature source Oral, resp. rate (!) 31, height 5\' 7"  (1.702 m), weight 69.3 kg, SpO2 100 %.  Weight trends: Filed Weights   12/18/18 0414 12/19/18 0409 12/21/18 0453  Weight: 67.4 kg 70.4 kg 69.3 kg    Physical Exam: General: NAD, resting in bed  Head: Normocephalic, atraumatic.  Eyes: Anicteric, EOMI  Nose: Mucous membranes moist, not inflammed, nonerythematous.  Throat: Oropharynx  nonerythematous, no exudate appreciated.   Neck: Supple, trachea midline.  Lungs:  Normal respiratory effort. Clear to auscultation BL without crackles or wheezes.  Heart: RRR. S1 and S2 normal without gallop, murmur, or rubs.  Abdomen:  BS normoactive. Soft, Nondistended, non-tender.  No masses or organomegaly.  Extremities: No pretibial edema.  Neurologic: A&O X3, Motor strength is 5/5 in the all 4 extremities  Skin: No visible rashes, scars.    Lab results: Basic Metabolic Panel: Recent Labs  Lab 12/20/18 0320 12/21/18 0448 12/22/18 0625 12/23/18 0344  NA 141 141 141 141  K 3.7 3.7 3.5 4.5  CL 109 111 114* 112*  CO2 24 24 19* 19*  GLUCOSE 120* 148* 192* 178*  BUN 11 13 18  36*  CREATININE 0.73 0.79 1.38* 2.20*  CALCIUM 8.8* 8.9 7.3* 7.9*  MG 1.7  --   --   --     Liver Function Tests: No results for input(s): AST, ALT, ALKPHOS, BILITOT, PROT, ALBUMIN in  the last 168 hours. No results for input(s): LIPASE, AMYLASE in the last 168 hours. No results for input(s): AMMONIA in the last 168 hours.  CBC: Recent Labs  Lab 12/21/18 1533 12/22/18 0105 12/22/18 0625 12/22/18 2033 12/23/18 0344  WBC 15.4* 13.5* 13.1* 16.5* 13.7*  NEUTROABS  --   --   --  13.4* 11.0*  HGB 7.5* 6.7* 7.9* 6.9* 7.6*  HCT 21.5* 19.7* 23.5* 20.7* 22.8*  MCV 81.1 82.1 83.9 85.2 85.4  PLT 219 175 147* 196 180    Cardiac Enzymes: No results for input(s): CKTOTAL, CKMB, CKMBINDEX, TROPONINI in the last 168 hours.  BNP: Invalid input(s): POCBNP  CBG: Recent Labs  Lab 12/22/18 1641 12/22/18 2210 12/23/18 0748 12/23/18 1134 12/23/18 1613  GLUCAP 209* 170* 153* 175* 102*    Microbiology: Results for orders placed or performed during the hospital encounter of 12/14/18  SARS Coronavirus 2 [facility] order, Performed in [facility] hospital lab) Nasopharyngeal Nasopharyngeal Swab     Status: None   Collection Time: 12/14/18  3:11 PM   Specimen: Nasopharyngeal Swab  Result Value Ref Range  Status   SARS Coronavirus 2 NEGATIVE NEGATIVE Final    Comment: (NOTE) If result is NEGATIVE SARS-CoV-2 target nucleic acids are NOT DETECTED. The SARS-CoV-2 RNA is generally detectable in upper and lower  respiratory specimens during the acute phase of infection. The lowest  concentration of SARS-CoV-2 viral copies this assay can detect is 250  copies / mL. A negative result does not preclude SARS-CoV-2 infection  and should not be used as the sole basis for treatment or other  patient management decisions.  A negative result may occur with  improper specimen collection / handling, submission of specimen other  than nasopharyngeal swab, presence of viral mutation(s) within the  areas targeted by this assay, and inadequate number of viral copies  (<250 copies / mL). A negative result must be combined with clinical  observations, patient history, and epidemiological information. If result is POSITIVE SARS-CoV-2 target nucleic acids are DETECTED. The SARS-CoV-2 RNA is generally detectable in upper and lower  respiratory specimens dur ing the acute phase of infection.  Positive  results are indicative of active infection with SARS-CoV-2.  Clinical  correlation with patient history and other diagnostic information is  necessary to determine patient infection status.  Positive results do  not rule out bacterial infection or co-infection with other viruses. If result is PRESUMPTIVE POSTIVE SARS-CoV-2 nucleic acids MAY BE PRESENT.   A presumptive positive result was obtained on the submitted specimen  and confirmed on repeat testing.  While 2019 novel coronavirus  (SARS-CoV-2) nucleic acids may be present in the submitted sample  additional confirmatory testing may be necessary for epidemiological  and / or clinical management purposes  to differentiate between  SARS-CoV-2 and other Sarbecovirus currently known to infect humans.  If clinically indicated additional testing with an alternate  test  methodology [phone]) is advised. The SARS-CoV-2 RNA is generally  detectable in upper and lower respiratory sp ecimens during the acute  phase of infection. The expected result is Negative. Fact Sheet for Patients:  BoilerBrush.com.cy Fact Sheet for Healthcare Providers: https://pope.com/ This test is not yet approved or cleared by the [place] FDA and has been authorized for detection and/or diagnosis of SARS-CoV-2 by FDA under an Emergency Use Authorization (EUA).  This EUA will remain in effect (meaning this test can be used) for the duration of the COVID-19 declaration under Section 564(b)(1) of the Act, 21  U.S.C. section 360bbb-3(b)(1), unless the authorization is terminated or revoked sooner. Performed at [facility], [address] [place], [place] [address]     Coagulation Studies: Recent Labs    12/21/18 0448 12/22/18 0625 12/23/18 0344  LABPROT 15.8* 26.2* 24.1*  INR 1.3* 2.5* 2.2*    Urinalysis: No results for input(s): COLORURINE, LABSPEC, PHURINE, GLUCOSEU, HGBUR, BILIRUBINUR, KETONESUR, PROTEINUR, UROBILINOGEN, NITRITE, LEUKOCYTESUR in the last 72 hours.  Invalid input(s): APPERANCEUR    Imaging: Dg Abd 1 View  Result Date: 12/22/2018 CLINICAL DATA:  Constipation EXAM: ABDOMEN - 1 VIEW COMPARISON:  CT dated December 14, 2018 FINDINGS: There is an average amount of stool throughout the colon. The bowel gas pattern is nonspecific and nonobstructive. Vascular stents are again noted. There is no evidence for an acute osseous abnormality. IMPRESSION: Negative. Electronically Signed   By: Katherine Mantle M.D.   On: 12/22/2018 22:28      Assessment & Plan: Pt is a 63 y.o. female with a PMHx of diabetes mellitus type 2, COPD, GERD, hypertension, peripheral vascular disease who was admitted to [facility] on 12/14/2018 for evaluation of abdominal pain, nausea, vomiting, and diarrhea.  1.  Acute renal  failure most likely secondary to contrast-induced nephropathy given multiple angiograms however rhabdomyolysis is a consideration as well. 2.  Metabolic acidosis. 3.  Anemia unspecified, may be related to blood loss. 4.  Hypertension.  Plan: Patient has had multiple angiograms this admission.  Therefore most likely suspect contrast-induced nephropathy however rhabdomyolysis is a consideration.  Continue IV fluid hydration with 0.9 normal saline.  If bicarbonate drops further we may switch to sodium bicarbonate drip.  No urgent indication for dialysis at the moment however this may be a consideration if renal function continues to deteriorate.  Otherwise defer additional vascular management to vascular surgery.  Further plan as patient progresses.

## 2018-12-24 LAB — PROTIME-INR
INR: 1.7 — ABNORMAL HIGH (ref 0.8–1.2)
Prothrombin Time: 19.6 seconds — ABNORMAL HIGH (ref 11.4–15.2)

## 2018-12-24 LAB — CBC
HCT: 18 % — ABNORMAL LOW (ref 36.0–46.0)
HCT: 20.9 % — ABNORMAL LOW (ref 36.0–46.0)
Hemoglobin: 5.9 g/dL — ABNORMAL LOW (ref 12.0–15.0)
Hemoglobin: 7.2 g/dL — ABNORMAL LOW (ref 12.0–15.0)
MCH: 28.4 pg (ref 26.0–34.0)
MCH: 29.5 pg (ref 26.0–34.0)
MCHC: 32.8 g/dL (ref 30.0–36.0)
MCHC: 34.4 g/dL (ref 30.0–36.0)
MCV: 86.5 fL (ref 80.0–100.0)
MCV: 85.7 fL (ref 80.0–100.0)
Platelets: 240 10*3/uL (ref 150–400)
Platelets: 258 10*3/uL (ref 150–400)
RBC: 2.08 MIL/uL — ABNORMAL LOW (ref 3.87–5.11)
RBC: 2.44 MIL/uL — ABNORMAL LOW (ref 3.87–5.11)
RDW: 15.2 % (ref 11.5–15.5)
RDW: 14.4 % (ref 11.5–15.5)
WBC: 19.2 10*3/uL — ABNORMAL HIGH (ref 4.0–10.5)
WBC: 20.9 10*3/uL — ABNORMAL HIGH (ref 4.0–10.5)
nRBC: 0 % (ref 0.0–0.2)
nRBC: 0 % (ref 0.0–0.2)

## 2018-12-24 LAB — BASIC METABOLIC PANEL
Anion gap: 9 (ref 5–15)
BUN: 35 mg/dL — ABNORMAL HIGH (ref 8–23)
CO2: 18 mmol/L — ABNORMAL LOW (ref 22–32)
Calcium: 8.1 mg/dL — ABNORMAL LOW (ref 8.9–10.3)
Chloride: 114 mmol/L — ABNORMAL HIGH (ref 98–111)
Creatinine, Ser: 2.09 mg/dL — ABNORMAL HIGH (ref 0.44–1.00)
GFR calc Af Amer: 28 mL/min — ABNORMAL LOW (ref >60)
GFR calc non Af Amer: 24 mL/min — ABNORMAL LOW (ref >60)
Glucose, Bld: 117 mg/dL — ABNORMAL HIGH (ref 70–99)
Potassium: 4.4 mmol/L (ref 3.5–5.1)
Sodium: 141 mmol/L (ref 135–145)

## 2018-12-24 LAB — PREPARE RBC (CROSSMATCH)

## 2018-12-24 LAB — GLUCOSE, CAPILLARY
Glucose-Capillary: 103 mg/dL — ABNORMAL HIGH (ref 70–99)
Glucose-Capillary: 129 mg/dL — ABNORMAL HIGH (ref 70–99)
Glucose-Capillary: 147 mg/dL — ABNORMAL HIGH (ref 70–99)
Glucose-Capillary: 99 mg/dL (ref 70–99)

## 2018-12-24 LAB — FIBRINOGEN: Fibrinogen: 681 mg/dL — ABNORMAL HIGH (ref 210–475)

## 2018-12-24 LAB — HEPARIN INDUCED PLATELET AB (HIT ANTIBODY): Heparin Induced Plt Ab: 0.196 OD (ref 0.000–0.400)

## 2018-12-24 LAB — VANCOMYCIN, RANDOM: Vancomycin Rm: 13

## 2018-12-24 MED ORDER — WARFARIN SODIUM 5 MG PO TABS
5.0000 mg | ORAL_TABLET | Freq: Once | ORAL | Status: AC
  Administered 2018-12-24: 5 mg via ORAL
  Filled 2018-12-24: qty 1

## 2018-12-24 MED ORDER — SODIUM CHLORIDE 0.9% IV SOLUTION
Freq: Once | INTRAVENOUS | Status: AC
  Administered 2018-12-29: 06:00:00 via INTRAVENOUS

## 2018-12-24 MED ORDER — SODIUM CHLORIDE 0.9% IV SOLUTION
Freq: Once | INTRAVENOUS | Status: AC
  Administered 2018-12-24: 21:00:00 via INTRAVENOUS

## 2018-12-24 MED ORDER — WARFARIN - PHARMACIST DOSING INPATIENT
Freq: Every day | Status: DC
  Administered 2018-12-24: 18:00:00

## 2018-12-24 MED ORDER — VANCOMYCIN HCL 1.25 G IV SOLR
1250.0000 mg | Freq: Once | INTRAVENOUS | Status: AC
  Administered 2018-12-24: 1250 mg via INTRAVENOUS
  Filled 2018-12-24: qty 1250

## 2018-12-24 MED ORDER — ACETAMINOPHEN 325 MG PO TABS
650.0000 mg | ORAL_TABLET | Freq: Four times a day (QID) | ORAL | Status: DC | PRN
  Administered 2018-12-24 – 2018-12-29 (×2): 650 mg via ORAL
  Filled 2018-12-24 (×2): qty 2

## 2018-12-24 NOTE — Plan of Care (Signed)
Pt's hemoglobin 5.9 and she has received 1 unit of blood. 3 Units was ordered, but she has only gotten 1 unit because blood bank is currently short on O type, so they said to give one unit and then recheck Hemoglobin to see if she will need more. Lab came by for the 1 hour post transfusion lab, but pt wants then to come back later. Vital signs has been stable. Will continue to monitor.

## 2018-12-24 NOTE — Progress Notes (Signed)
[name]  ROUNDING NOTE   Subjective:  Patient seen at bedside. Creatinine currently 2.09. CK a bit high at 2617 as well. Good urine output of 2.3 L yesterday.  Objective:  Vital signs in last 24 hours:  Temp:  [97.8 F (36.6 C)-103 F (39.4 C)] 97.8 F (36.6 C) (09/06 0800) Pulse Rate:  [100-132] 101 (09/06 1100) Resp:  [13-31] 20 (09/06 1100) BP: (101-157)/(52-90) 121/52 (09/06 1100) SpO2:  [100 %] 100 % (09/06 1100)  Weight change:  Filed Weights   12/18/18 0414 12/19/18 0409 12/21/18 0453  Weight: 67.4 kg 70.4 kg 69.3 kg    Intake/Output: I/O last 3 completed shifts: In: 3342.2 [P.O.:480; I.V.:1732.1; Other:430; IV Piggyback:700.1] Out: 2930 [Urine:2930]   Intake/Output this shift:  Total I/O In: 362.4 [I.V.:262.4; IV Piggyback:100] Out: 820 [Urine:820]  Physical Exam: General: No acute distress  Head: Normocephalic, atraumatic. Moist oral mucosal membranes  Eyes: Anicteric  Neck: Supple, trachea midline  Lungs:  Clear to auscultation, normal effort  Heart: S1S2 no rubs  Abdomen:  Soft, nontender, bowel sounds present  Extremities: Trace peripheral edema.  Neurologic: Awake, alert, following commands  Skin: No lesions       Basic Metabolic Panel: Recent Labs  Lab 12/20/18 0320 12/21/18 0448 12/22/18 0625 12/23/18 0344 12/24/18 0732  NA 141 141 141 141 141  K 3.7 3.7 3.5 4.5 4.4  CL 109 111 114* 112* 114*  CO2 24 24 19* 19* 18*  GLUCOSE 120* 148* 192* 178* 117*  BUN 11 13 18  36* 35*  CREATININE 0.73 0.79 1.38* 2.20* 2.09*  CALCIUM 8.8* 8.9 7.3* 7.9* 8.1*  MG 1.7  --   --   --   --     Liver Function Tests: No results for input(s): AST, ALT, ALKPHOS, BILITOT, PROT, ALBUMIN in the last 168 hours. No results for input(s): LIPASE, AMYLASE in the last 168 hours. No results for input(s): AMMONIA in the last 168 hours.  CBC: Recent Labs  Lab 12/22/18 0105 12/22/18 0625 12/22/18 2033 12/23/18 0344 12/24/18 0732  WBC 13.5*  13.1* 16.5* 13.7* 19.2*  NEUTROABS  --   --  13.4* 11.0*  --   HGB 6.7* 7.9* 6.9* 7.6* 5.9*  HCT 19.7* 23.5* 20.7* 22.8* 18.0*  MCV 82.1 83.9 85.2 85.4 86.5  PLT 175 147* 196 180 240    Cardiac Enzymes: Recent Labs  Lab 12/23/18 1802  CKTOTAL 2,617*    BNP: Invalid input(s): POCBNP  CBG: Recent Labs  Lab 12/23/18 1134 12/23/18 1613 12/23/18 2153 12/24/18 0739 12/24/18 1200  GLUCAP 175* 102* 109* 103* 129*    Microbiology: Results for orders placed or performed during the hospital encounter of 12/14/18  SARS Coronavirus 2 [facility](Hospital order, Performed in [facility]) Nasopharyngeal Nasopharyngeal Swab     Status: None   Collection Time: 12/14/18  3:11 PM   Specimen: Nasopharyngeal Swab  Result Value Ref Range Status   SARS Coronavirus 2 NEGATIVE NEGATIVE Final    Comment: (NOTE) If result is NEGATIVE SARS-CoV-2 target nucleic acids are NOT DETECTED. The SARS-CoV-2 RNA is generally detectable in upper and lower  respiratory specimens during the acute phase of infection. The lowest  concentration of SARS-CoV-2 viral copies this assay can detect is 250  copies / mL. A negative result does not preclude SARS-CoV-2 infection  and should not be used as the sole basis for treatment or other  patient management decisions.  A negative result may occur with  improper specimen collection / handling,  submission of specimen other  than nasopharyngeal swab, presence of viral mutation(s) within the  areas targeted by this assay, and inadequate number of viral copies  (<250 copies / mL). A negative result must be combined with clinical  observations, patient history, and epidemiological information. If result is POSITIVE SARS-CoV-2 target nucleic acids are DETECTED. The SARS-CoV-2 RNA is generally detectable in upper and lower  respiratory specimens dur ing the acute phase of infection.  Positive  results are indicative of active infection with SARS-CoV-2.  Clinical   correlation with patient history and other diagnostic information is  necessary to determine patient infection status.  Positive results do  not rule out bacterial infection or co-infection with other viruses. If result is PRESUMPTIVE POSTIVE SARS-CoV-2 nucleic acids MAY BE PRESENT.   A presumptive positive result was obtained on the submitted specimen  and confirmed on repeat testing.  While 2019 novel coronavirus  (SARS-CoV-2) nucleic acids may be present in the submitted sample  additional confirmatory testing may be necessary for epidemiological  and / or clinical management purposes  to differentiate between  SARS-CoV-2 and other Sarbecovirus currently known to infect humans.  If clinically indicated additional testing with an alternate test  methodology [id number]) is advised. The SARS-CoV-2 RNA is generally  detectable in upper and lower respiratory sp ecimens during the acute  phase of infection. The expected result is Negative. Fact Sheet for Patients:  StrictlyIdeas.no Fact Sheet for Healthcare Providers: BankingDealers.co.za This test is not yet approved or cleared by the [place] FDA and has been authorized for detection and/or diagnosis of SARS-CoV-2 by FDA under an Emergency Use Authorization (EUA).  This EUA will remain in effect (meaning this test can be used) for the duration of the COVID-19 declaration under Section 564(b)(1) of the Act, 21 U.S.C. section 360bbb-3(b)(1), unless the authorization is terminated or revoked sooner. Performed at [facility], [address] [place], [place] [address]   CULTURE, BLOOD (ROUTINE X 2) w Reflex to ID Panel     Status: None (Preliminary result)   Collection Time: 12/23/18  3:22 PM   Specimen: BLOOD  Result Value Ref Range Status   Specimen Description BLOOD BLOOD LEFT HAND  Final   Special Requests   Final    BOTTLES DRAWN AEROBIC AND ANAEROBIC Blood Culture  adequate volume   Culture   Final    NO GROWTH < 24 HOURS Performed at [facility], [address], [place], [name] [address]    Report Status PENDING  Incomplete  CULTURE, BLOOD (ROUTINE X 2) w Reflex to ID Panel     Status: None (Preliminary result)   Collection Time: 12/23/18  5:05 PM   Specimen: BLOOD  Result Value Ref Range Status   Specimen Description BLOOD RT HAND  Final   Special Requests   Final    BOTTLES DRAWN AEROBIC AND ANAEROBIC Blood Culture adequate volume   Culture   Final    NO GROWTH < 24 HOURS Performed at [facility], [address], [place],  [address]    Report Status PENDING  Incomplete  MRSA PCR Screening     Status: None   Collection Time: 12/23/18  5:19 PM   Specimen: Nasal Mucosa; Nasopharyngeal  Result Value Ref Range Status   MRSA by PCR NEGATIVE NEGATIVE Final    Comment:        The GeneXpert MRSA Assay (FDA approved for NASAL specimens only), is one component of a comprehensive MRSA colonization surveillance  program. It is not intended to diagnose MRSA infection nor to guide or monitor treatment for MRSA infections. Performed at [facility], [address] [place], [place] [address]     Coagulation Studies: Recent Labs    12/22/18 0625 12/23/18 0344 12/24/18 0435  LABPROT 26.2* 24.1* 19.6*  INR 2.5* 2.2* 1.7*    Urinalysis: No results for input(s): COLORURINE, LABSPEC, PHURINE, GLUCOSEU, HGBUR, BILIRUBINUR, KETONESUR, PROTEINUR, UROBILINOGEN, NITRITE, LEUKOCYTESUR in the last 72 hours.  Invalid input(s): APPERANCEUR    Imaging: Dg Abd 1 View  Result Date: 12/22/2018 CLINICAL DATA:  Constipation EXAM: ABDOMEN - 1 VIEW COMPARISON:  CT dated December 14, 2018 FINDINGS: There is an average amount of stool throughout the colon. The bowel gas pattern is nonspecific and nonobstructive. Vascular stents are again noted. There is no evidence for an acute osseous abnormality. IMPRESSION: Negative.  Electronically Signed   By: Katherine Mantle M.D.   On: 12/22/2018 22:28     Medications:   . sodium chloride 75 mL/hr at 12/24/18 1149  . aztreonam Stopped (12/24/18 0854)   . sodium chloride   Intravenous Once  . atorvastatin  10 mg Oral Daily  . Chlorhexidine Gluconate Cloth  6 each Topical Daily  . docusate sodium  100 mg Oral BID  . HYDROmorphone   Intravenous Q4H  . insulin aspart  0-5 Units Subcutaneous QHS  . insulin aspart  0-9 Units Subcutaneous TID WC  . iron polysaccharides  150 mg Oral Daily  . linagliptin  5 mg Oral Daily  . metoCLOPramide  5 mg Oral TID AC  . multivitamin with minerals  1 tablet Oral Daily  . pantoprazole  40 mg Oral Daily  . polyethylene glycol  17 g Oral Daily  . senna  2 tablet Oral Daily  . sodium chloride flush  3 mL Intravenous Q12H  . warfarin  5 mg Oral ONCE-1800  . Warfarin - Pharmacist Dosing Inpatient   Does not apply q1800   ALPRAZolam, bisacodyl, diphenhydrAMINE **OR** diphenhydrAMINE, hydrALAZINE, naloxone **AND** sodium chloride flush, ondansetron (ZOFRAN) IV, ondansetron (ZOFRAN) IV, oxyCODONE, oxyCODONE  Assessment/ Plan:  63 y.o. female with a PMHx of diabetes mellitus type 2, COPD, GERD, hypertension, peripheral vascular disease who was admitted to [facility] on 12/14/2018 for evaluation of abdominal pain, nausea, vomiting, and diarrhea.  1.  Acute renal failure most likely secondary to contrast-induced nephropathy given multiple angiograms however rhabdomyolysis is a consideration as well. 2.  Metabolic acidosis. 3.  Anemia unspecified, may be related to blood loss. 4.  Hypertension.  Plan: Patient seen at bedside.  She did have good urine output of 2.3 L.  Renal function appears to have stabilized.  She also underlying rhabdomyolysis likely from ischemic limb.  However it appears that she is declining right BKA.  Continue IV fluid hydration for now.  Also worsening anemia noted.  Patient to be transfused 3 units PRBCs per vascular  surgery.   LOS: 10 [name] 9/6/202012:42 PM

## 2018-12-24 NOTE — Progress Notes (Signed)
[employer] [facility] [place] [facility]   PATIENT NAME: [name]    MR#:  [id number]  DATE OF BIRTH:  [birth year]  Hemoglobin 5.9, patient right foot is cold to touch, vascular surgery recommending right BKA but patient refused.  103 Fahrenheit yesterday afebrile now.  CHIEF COMPLAINT:   Chief Complaint  Patient presents with  . Abdominal Pain  Has severe right leg pain, no chest pain or shortness of breath REVIEW OF SYSTEMS:  CONSTITUTIONAL: No fever, fatigue or weakness.  EYES: No blurred or double vision.  EARS, NOSE, AND THROAT: No tinnitus or ear pain.  RESPIRATORY: No cough, shortness of breath, wheezing or hemoptysis.  CARDIOVASCULAR: No chest pain, orthopnea, edema.  GASTROINTESTINAL: No nausea or vomiting.  Has constipation.  GENITOURINARY: No dysuria, hematuria.  ENDOCRINE: No polyuria, nocturia,  HEMATOLOGY: Anemia present    sKIN:  severe right leg pain   pSYCHIATRY: No anxiety or depression.  Right leg pain, swelling, cold to touch.   DRUG ALLERGIES:   Allergies  Allergen Reactions  . Penicillins Hives    Has patient had a PCN reaction causing immediate rash, facial/tongue/throat swelling, SOB or lightheadedness with hypotension: Yes Has patient had a PCN reaction causing severe rash involving mucus membranes or skin necrosis: No Has patient had a PCN reaction that required hospitalization: No Has patient had a PCN reaction occurring within the last 10 years: No If all of the above answers are "NO", then may proceed with Cephalosporin use.  . Tramadol Itching    VITALS:  Blood pressure (!) 121/52, pulse (!) 101, temperature 97.8 F (36.6 C), temperature source Oral, resp. rate 20, height 5\' 7"  (1.702 m), weight 69.3 kg, SpO2 100 %.  PHYSICAL EXAMINATION:  GENERAL:  63 y.o.-year-old patient lying in the bed with no acute distress.  EYES: Pupils equal, round, reactive to light and accommodation. No scleral icterus. Extraocular muscles  intact.  HEENT: Head atraumatic, normocephalic. Oropharynx and nasopharynx clear.  NECK:  Supple, no jugular venous distention. No thyroid enlargement, no tenderness.  LUNGS: Clear to auscultation with occasional basilar crepitations present CARDIOVASCULAR: S1, S2 tachycardic l. No murmurs, rubs, or gallops.  ABDOMEN: Soft, nontender, nondistended. Bowel sounds present. No organomegaly or mass.  EXTREMITIES: Patient has gangrenous changes on right foot toes, right foot cool to touch  No obvious hematoma, left foot viable, right foot cold to touch  nEUROLOGIC: Cranial nerves II through XII are intact. Muscle strength 4/5 in all extremities. Sensation intact. Gait not checked.  PSYCHIATRIC: The patient is alert and oriented x 3.  SKIN: Right leg pain, swelling, discoloration of the right great toe, fourth toe LABORATORY PANEL:   CBC Recent Labs  Lab 12/24/18 0732  WBC 19.2*  HGB 5.9*  HCT 18.0*  PLT 240   ------------------------------------------------------------------------------------------------------------------  Chemistries  Recent Labs  Lab 12/20/18 0320  12/24/18 0732  NA 141   < > 141  K 3.7   < > 4.4  CL 109   < > 114*  CO2 24   < > 18*  GLUCOSE 120*   < > 117*  BUN 11   < > 35*  CREATININE 0.73   < > 2.09*  CALCIUM 8.8*   < > 8.1*  MG 1.7  --   --    < > = values in this interval not displayed.   ------------------------------------------------------------------------------------------------------------------  Cardiac Enzymes No results for input(s): TROPONINI in the last 168 hours. ------------------------------------------------------------------------------------------------------------------  RADIOLOGY:  Dg Abd 1 View  Result Date: 12/22/2018 CLINICAL DATA:  Constipation EXAM: ABDOMEN - 1 VIEW COMPARISON:  CT dated December 14, 2018 FINDINGS: There is an average amount of stool throughout the colon. The bowel gas pattern is nonspecific and nonobstructive.  Vascular stents are again noted. There is no evidence for an acute osseous abnormality. IMPRESSION: Negative. Electronically Signed   By: Constance Holster M.D.   On: 12/22/2018 22:28    ASSESSMENT AND PLAN:   Active Problems:   DKA (diabetic ketoacidoses) (HCC)  Diabetic diet for peripheral artery disease status post multiple angiograms to the right leg, now has fever, gangrene of the right toes, started on aggressive antibiotics no fever now, patient is offered right BKA as patient continues to have cold right foot despite multiple angiograms, patient does not want to have right BKA. White count up to 19.2, continue Azactam.  Severe PAD, bilateral leg pain, status post right leg angiogram 3 times since his hospitalization, still right foot is not viable evidence of gangrene in the toes, right foot still cold to touch, vascular surgery recommends right BKA but patient refused, Status post left leg angiogram and stenting. Continue Coumadin as per vascular surgery    Acute blood loss anemia, hemoglobin drifted to five-point 9-18, transfuse due to packed RBC, orders placed.  Hypertension.  Continue home uncontrolled diabetes mellitus type 2: With PAD ; Tradjenta, sliding scale insulin with coverage.     Constipation, continue bowel regimen while patient is on narcotics.  Discussed with patient's pharmacist  Acute kidney injury, likely secondary to contrast nephropathy, started IV fluids, renal function better, urine output is good.    Constipation secondary to narcotics, continue bowel regimen.    All the records are reviewed and case discussed with Care Management/Social Workerr. Management plans discussed with the patient, family and they are in agreement.  CODE STATUS: Full  TOTAL TIME TAKING CARE OF THIS PATIENT: 64 minutes.  Patient condition critical, high risk for cardiac arrest.   More than 50% time spent in counseling, coordination of care   POSSIBLE D/C IN 1-2  DAYS, DEPENDING ON CLINICAL CONDITION.   Epifanio Lesches M.D on 12/24/2018   Between 7am to 6pm - Pager - [phone]  After 6pm go to [email or web address] - password [email or web address] [place] [facility]  Office  [phone]  CC: Primary care physician; Center, [place]  Note: This dictation was prepared with Diplomatic Services operational officer dictation along with smaller phrase technology. Any transcriptional errors that result from this process are unintentional.

## 2018-12-24 NOTE — Consult Note (Signed)
Pharmacy Antibiotic Note  [name] is a 63 y.o. female admitted on 12/14/2018 with fever.  Pharmacy has been consulted for aztreonam and vancomycin dosing.  Plan: Will give aztreonam 2 g load followed by 1 g q8H.   Vancomycin 1750 mg x 1 loading dose. Will dose by levels and order a 24 hour random level. Scr improving (2.20> 2.09). Vancomycin random level returned at 13. Will give vancomycin 1250 mg x 1 and order a 24 hour level.   Height: 5\' 7"  (170.2 cm) Weight: 152 lb 12.8 oz (69.3 kg) IBW/kg (Calculated) : 61.6  Temp (24hrs), Avg:99.3 F (37.4 C), Min:97.8 F (36.6 C), Max:101.8 F (38.8 C)  Recent Labs  Lab 12/20/18 0320 12/21/18 0448  12/22/18 0105 12/22/18 0625 12/22/18 2033 12/23/18 0344 12/24/18 0732 12/24/18 1425  WBC 10.6* 9.8   < > 13.5* 13.1* 16.5* 13.7* 19.2*  --   CREATININE 0.73 0.79  --   --  1.38*  --  2.20* 2.09*  --   VANCORANDOM  --   --   --   --   --   --   --   --  13   < > = values in this interval not displayed.    Estimated Creatinine Clearance: 26.4 mL/min (A) (by C-G formula based on SCr of 2.09 mg/dL (H)).    Allergies  Allergen Reactions  . Penicillins Hives    Has patient had a PCN reaction causing immediate rash, facial/tongue/throat swelling, SOB or lightheadedness with hypotension: Yes Has patient had a PCN reaction causing severe rash involving mucus membranes or skin necrosis: No Has patient had a PCN reaction that required hospitalization: No Has patient had a PCN reaction occurring within the last 10 years: No If all of the above answers are "NO", then may proceed with Cephalosporin use.  . Tramadol Itching    Antimicrobials this admission: 9/5 pip/tazo >>  9/5 vancomycin >>   Dose adjustments this admission: None  Microbiology results: 9/5 BCx: pending  Thank you for allowing pharmacy to be a part of this patient's care.  Oswald Hillock, PharmD, BCPS 12/24/2018 3:00 PM

## 2018-12-24 NOTE — Progress Notes (Addendum)
[facility]  Daily Progress Note   Assessment/Planning:   POD #2 s/p B multiple endovascular interventions   Pt notes pain in R leg but not much different from baseline  I discussed with the patient my concerns that her right foot was dying.  At this point, she is not interested in considering R BKA  Would continue coumadin use at this point to try to maintain stent patency, though it may potentiate bleeding.  H/H has drifted down to 5.9/18.0.  Would transfuse 3 units and recheck fibrinogen.  If low, would go ahead and give cryo as the patient is now at the point where her coagulopathy may become life threatening.  The caveat is correcting the coagulopathy may lead to thrombosis of the stents and tibial arteries.  Creatinine improving, suggesting CIN vs toxic myoglobin exposure from dying muscle.  CK somewhat elevated at 2617.  Regardless, pt declining R BKA at this point   Subjective  - 2 Days Post-Op   Pain unchanged   Objective   Vitals:   12/24/18 0835 12/24/18 0900 12/24/18 1000 12/24/18 1100  BP:  (!) 133/58 132/64 (!) 121/52  Pulse:  (!) 105 100 (!) 101  Resp: 18 17 18 20   Temp:      TempSrc:      SpO2: 100% 100% 100% 100%  Weight:      Height:         Intake/Output Summary (Last 24 hours) at 12/24/2018 1230 Last data filed at 12/24/2018 1149 Gross per 24 hour  Intake 2498.9 ml  Output 3150 ml  Net -651.1 ml    PULM  BLL rales  CV  RR, tachycardiac  GI  soft, NTND  VASC B groin: pressure assist device still on, but deflated, no obvious hematoma, L foot viable, R foot cold and minimally mobile and insensate    Laboratory   CBC CBC Latest Ref Rng & Units 12/24/2018 12/23/2018 12/22/2018  WBC 4.0 - 10.5 K/uL 19.2(H) 13.7(H) 16.5(H)  Hemoglobin 12.0 - 15.0 g/dL 5.9(L) 7.6(L) 6.9(L)  Hematocrit 36.0 - 46.0 % 18.0(L) 22.8(L) 20.7(L)  Platelets 150 - 400 K/uL 240 180 196    BMET    Component Value Date/Time   NA 141 12/24/2018 0732    K 4.4 12/24/2018 0732   CL 114 (H) 12/24/2018 0732   CO2 18 (L) 12/24/2018 0732   GLUCOSE 117 (H) 12/24/2018 0732   BUN 35 (H) 12/24/2018 0732   CREATININE 2.09 (H) 12/24/2018 0732   CALCIUM 8.1 (L) 12/24/2018 0732   GFRNONAA 24 (L) 12/24/2018 0732   GFRAA 28 (L) 12/24/2018 0732     Adele Barthel, MD, FACS, FSVS [facility]  [facility] Ve[facility]: [phone]

## 2018-12-24 NOTE — Consult Note (Signed)
ANTICOAGULATION CONSULT NOTE - Initial Consult  Pharmacy Consult for warfarin Indication: Hx of DVT and s/p multiple endovascular interventions.   Allergies  Allergen Reactions  . Penicillins Hives    Has patient had a PCN reaction causing immediate rash, facial/tongue/throat swelling, SOB or lightheadedness with hypotension: Yes Has patient had a PCN reaction causing severe rash involving mucus membranes or skin necrosis: No Has patient had a PCN reaction that required hospitalization: No Has patient had a PCN reaction occurring within the last 10 years: No If all of the above answers are "NO", then may proceed with Cephalosporin use.  . Tramadol Itching    Patient Measurements: Height: 5\' 7"  (170.2 cm) Weight: 152 lb 12.8 oz (69.3 kg) IBW/kg (Calculated) : 61.6  Vital Signs: Temp: 97.8 F (36.6 C) (09/06 0800) Temp Source: Oral (09/06 0800) BP: 121/52 (09/06 1100) Pulse Rate: 101 (09/06 1100)  Labs: Recent Labs    12/22/18 0625 12/22/18 2033 12/23/18 0344 12/23/18 1802 12/24/18 0435 12/24/18 0732  HGB 7.9* 6.9* 7.6*  --   --  5.9*  HCT 23.5* 20.7* 22.8*  --   --  18.0*  PLT 147* 196 180  --   --  240  LABPROT 26.2*  --  24.1*  --  19.6*  --   INR 2.5*  --  2.2*  --  1.7*  --   CREATININE 1.38*  --  2.20*  --   --  2.09*  CKTOTAL  --   --   --  2,617*  --   --     Estimated Creatinine Clearance: 26.4 mL/min (A) (by C-G formula based on SCr of 2.09 mg/dL (H)).   Medical History: Past Medical History:  Diagnosis Date  . Anxiety    h/o  . COPD (chronic obstructive pulmonary disease) ([place])   . Diabetes mellitus without complication ([place])   . GERD (gastroesophageal reflux disease)   . Hypertension    bp under control-off meds since 2019    Medications:  Medications Prior to Admission  Medication Sig Dispense Refill Last Dose  . albuterol (PROVENTIL HFA;VENTOLIN HFA) 108 (90 Base) MCG/ACT inhaler Inhale 2 puffs into the lungs every 6 (six) hours as needed  for wheezing or shortness of breath. 1 Inhaler 2 prn at prn  . alum & mag hydroxide-simeth (MAALOX/MYLANTA) 200-200-20 MG/5ML suspension Take 30 mLs by mouth every 4 (four) hours as needed for indigestion or heartburn. 355 mL 0 prn at prn  . atorvastatin (LIPITOR) 10 MG tablet Take 1 tablet (10 mg total) by mouth daily. 90 tablet 3 12/13/2018 at Unknown time  . Biotin 1000 MCG tablet Take 1,000 tablets by mouth daily.    12/13/2018 at Unknown time  . cetirizine (ZYRTEC) 10 MG tablet Take 10 mg by mouth daily.   12/13/2018 at Unknown time  . cholecalciferol (VITAMIN D3) 25 MCG (1000 UT) tablet Take 1,000 Units by mouth daily.   12/13/2018 at Unknown time  . iron polysaccharides (NIFEREX) 150 MG capsule Take 1 capsule (150 mg total) by mouth daily. 30 capsule 3 12/13/2018 at Unknown time  . Multiple Vitamins-Minerals (WOMENS MULTIVITAMIN PO) Take 1 tablet by mouth daily.   12/13/2018 at Unknown time  . pantoprazole (PROTONIX) 40 MG tablet Take 1 tablet (40 mg total) by mouth daily. 30 tablet 0 12/13/2018 at Unknown time  . sitaGLIPtin (JANUVIA) 100 MG tablet Take 100 mg by mouth daily.   12/13/2018 at Unknown time  . vitamin C (ASCORBIC ACID) 500 MG tablet Take  500 mg by mouth daily.   12/13/2018 at Unknown time  . warfarin (COUMADIN) 5 MG tablet Take 1 tablet (5 mg total) by mouth daily at 6 PM. 30 tablet 5 12/13/2018 at Unknown time   Scheduled:  . atorvastatin  10 mg Oral Daily  . Chlorhexidine Gluconate Cloth  6 each Topical Daily  . docusate sodium  100 mg Oral BID  . HYDROmorphone   Intravenous Q4H  . insulin aspart  0-5 Units Subcutaneous QHS  . insulin aspart  0-9 Units Subcutaneous TID WC  . iron polysaccharides  150 mg Oral Daily  . linagliptin  5 mg Oral Daily  . metoCLOPramide  5 mg Oral TID AC  . multivitamin with minerals  1 tablet Oral Daily  . pantoprazole  40 mg Oral Daily  . polyethylene glycol  17 g Oral Daily  . senna  2 tablet Oral Daily  . sodium chloride flush  3 mL Intravenous  Q12H  . warfarin  5 mg Oral ONCE-1800  . Warfarin - Pharmacist Dosing Inpatient   Does not apply q1800   Infusions:  . sodium chloride 75 mL/hr at 12/24/18 1149  . aztreonam Stopped (12/24/18 0854)   PRN: ALPRAZolam, bisacodyl, diphenhydrAMINE **OR** diphenhydrAMINE, hydrALAZINE, naloxone **AND** sodium chloride flush, ondansetron (ZOFRAN) IV, ondansetron (ZOFRAN) IV, oxyCODONE, oxyCODONE Anti-infectives (From admission, onward)   Start     Dose/Rate Route Frequency Ordered Stop   12/23/18 2200  aztreonam (AZACTAM) 1 g in sodium chloride 0.9 % 100 mL IVPB     1 g 200 mL/hr over 30 Minutes Intravenous Every 8 hours 12/23/18 1522     12/23/18 1600  vancomycin (VANCOCIN) 1,750 mg in sodium chloride 0.9 % 500 mL IVPB     1,750 mg 250 mL/hr over 120 Minutes Intravenous  Once 12/23/18 1517 12/23/18 1859   12/23/18 1530  aztreonam (AZACTAM) 2 g in sodium chloride 0.9 % 100 mL IVPB     2 g 200 mL/hr over 30 Minutes Intravenous  Once 12/23/18 1522 12/23/18 2015   12/22/18 1200  clindamycin (CLEOCIN) IVPB 300 mg     300 mg 100 mL/hr over 30 Minutes Intravenous  Once 12/22/18 1146 12/22/18 1707   12/22/18 1133  clindamycin (CLEOCIN) 300 MG/50ML IVPB    Note to Pharmacy: Jorja LoaBurgin, Brooke   : cabinet override      12/22/18 1133 12/22/18 1145   12/21/18 2215  clindamycin (CLEOCIN) IVPB 300 mg     300 mg 100 mL/hr over 30 Minutes Intravenous  Once 12/21/18 2209 12/22/18 0022   12/21/18 1500  levofloxacin (LEVAQUIN) IVPB 750 mg  Status:  Discontinued     750 mg 100 mL/hr over 90 Minutes Intravenous Daily-1800 12/21/18 1338 12/22/18 1114   12/20/18 2215  clindamycin (CLEOCIN) IVPB 300 mg     300 mg 100 mL/hr over 30 Minutes Intravenous  Once 12/20/18 2207 12/21/18 0850   12/18/18 1600  clindamycin (CLEOCIN) IVPB 300 mg     300 mg 100 mL/hr over 30 Minutes Intravenous  Once 12/18/18 1548 12/18/18 1735   12/18/18 0100  clindamycin (CLEOCIN) IVPB 300 mg     300 mg 100 mL/hr over 30 Minutes  Intravenous  Once 12/18/18 0058 12/18/18 0922   12/15/18 2000  ciprofloxacin (CIPRO) IVPB 400 mg  Status:  Discontinued     400 mg 200 mL/hr over 60 Minutes Intravenous Every 12 hours 12/15/18 1811 12/18/18 1138   12/15/18 1800  metroNIDAZOLE (FLAGYL) IVPB 500 mg  Status:  Discontinued  500 mg 100 mL/hr over 60 Minutes Intravenous Every 8 hours 12/15/18 1751 12/18/18 1138      Assessment: Pharmacy underwent multiple endovascular interventions. INR 1.7. Messaged Dr Imogene Burn, and he would like to continue warfarin at this time. Pt may need a R BKA vs AKA. 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 Imogene Burn. To prevent stents from closing will start warfarin. Pt is on aztreonam and vancomycin.  Home regimen: warfarin 5 mg daily   Date INR Warfarin Dose  9/6 1.7 5 mg        Goal of Therapy:  INR 2-3 Monitor platelets by anticoagulation protocol: Yes   Plan:  Will give patient 5 mg warfarin tonight (home dose). Daily INR ordered. Monitor CBC q3 days  Ronnald Ramp, PharmD, BCPS 12/24/2018,12:18 PM

## 2018-12-25 LAB — COMPREHENSIVE METABOLIC PANEL
ALT: 56 U/L — ABNORMAL HIGH (ref 0–44)
AST: 117 U/L — ABNORMAL HIGH (ref 15–41)
Albumin: 2.6 g/dL — ABNORMAL LOW (ref 3.5–5.0)
Alkaline Phosphatase: 104 U/L (ref 38–126)
Anion gap: 10 (ref 5–15)
BUN: 28 mg/dL — ABNORMAL HIGH (ref 8–23)
CO2: 20 mmol/L — ABNORMAL LOW (ref 22–32)
Calcium: 8.6 mg/dL — ABNORMAL LOW (ref 8.9–10.3)
Chloride: 111 mmol/L (ref 98–111)
Creatinine, Ser: 1.62 mg/dL — ABNORMAL HIGH (ref 0.44–1.00)
GFR calc Af Amer: 38 mL/min — ABNORMAL LOW (ref >60)
GFR calc non Af Amer: 33 mL/min — ABNORMAL LOW (ref >60)
Glucose, Bld: 104 mg/dL — ABNORMAL HIGH (ref 70–99)
Potassium: 4.4 mmol/L (ref 3.5–5.1)
Sodium: 141 mmol/L (ref 135–145)
Total Bilirubin: 0.8 mg/dL (ref 0.3–1.2)
Total Protein: 6.6 g/dL (ref 6.5–8.1)

## 2018-12-25 LAB — TYPE AND SCREEN
ABO/RH(D): O POS
Antibody Screen: NEGATIVE
Unit division: 0
Unit division: 0
Unit division: 0
Unit division: 0
Unit division: 0
Unit division: 0

## 2018-12-25 LAB — BPAM RBC
Blood Product Expiration Date: 202009062359
Blood Product Expiration Date: 202010092359
Blood Product Expiration Date: 202010092359
Blood Product Expiration Date: 202010112359
Blood Product Expiration Date: 202010112359
Blood Product Expiration Date: 202010112359
ISSUE DATE / TIME: 202009040151
ISSUE DATE / TIME: 202009042305
ISSUE DATE / TIME: 202009061343
ISSUE DATE / TIME: 202009062042
Unit Type and Rh: 5100
Unit Type and Rh: 5100
Unit Type and Rh: 5100
Unit Type and Rh: 5100
Unit Type and Rh: 5100
Unit Type and Rh: 9500

## 2018-12-25 LAB — GLUCOSE, CAPILLARY
Glucose-Capillary: 85 mg/dL (ref 70–99)
Glucose-Capillary: 122 mg/dL — ABNORMAL HIGH (ref 70–99)
Glucose-Capillary: 125 mg/dL — ABNORMAL HIGH (ref 70–99)
Glucose-Capillary: 122 mg/dL — ABNORMAL HIGH (ref 70–99)

## 2018-12-25 LAB — CBC
HCT: 25.4 % — ABNORMAL LOW (ref 36.0–46.0)
Hemoglobin: 8.5 g/dL — ABNORMAL LOW (ref 12.0–15.0)
MCH: 29.3 pg (ref 26.0–34.0)
MCHC: 33.5 g/dL (ref 30.0–36.0)
MCV: 87.6 fL (ref 80.0–100.0)
Platelets: 331 10*3/uL (ref 150–400)
RBC: 2.9 MIL/uL — ABNORMAL LOW (ref 3.87–5.11)
RDW: 14.5 % (ref 11.5–15.5)
WBC: 21.1 10*3/uL — ABNORMAL HIGH (ref 4.0–10.5)
nRBC: 0 % (ref 0.0–0.2)

## 2018-12-25 LAB — PREPARE RBC (CROSSMATCH)

## 2018-12-25 LAB — PHOSPHORUS: Phosphorus: 4.1 mg/dL (ref 2.5–4.6)

## 2018-12-25 LAB — HEMOGLOBIN AND HEMATOCRIT, BLOOD
HCT: 21 % — ABNORMAL LOW (ref 36.0–46.0)
Hemoglobin: 7.3 g/dL — ABNORMAL LOW (ref 12.0–15.0)

## 2018-12-25 LAB — PROTIME-INR
INR: 1.4 — ABNORMAL HIGH (ref 0.8–1.2)
Prothrombin Time: 17.1 seconds — ABNORMAL HIGH (ref 11.4–15.2)

## 2018-12-25 LAB — MAGNESIUM: Magnesium: 2 mg/dL (ref 1.7–2.4)

## 2018-12-25 LAB — VANCOMYCIN, RANDOM: Vancomycin Rm: 15

## 2018-12-25 MED ORDER — WARFARIN SODIUM 7.5 MG PO TABS
7.5000 mg | ORAL_TABLET | Freq: Once | ORAL | Status: AC
  Administered 2018-12-25: 7.5 mg via ORAL
  Filled 2018-12-25: qty 1

## 2018-12-25 MED ORDER — SODIUM CHLORIDE 0.9% FLUSH
10.0000 mL | Freq: Two times a day (BID) | INTRAVENOUS | Status: DC
  Administered 2018-12-25 – 2019-01-05 (×18): 10 mL

## 2018-12-25 MED ORDER — ATORVASTATIN CALCIUM 10 MG PO TABS
10.0000 mg | ORAL_TABLET | Freq: Every day | ORAL | Status: DC
  Administered 2018-12-25 – 2018-12-29 (×5): 10 mg via ORAL
  Filled 2018-12-25 (×5): qty 1

## 2018-12-25 MED ORDER — VANCOMYCIN HCL IN DEXTROSE 1-5 GM/200ML-% IV SOLN
1000.0000 mg | Freq: Once | INTRAVENOUS | Status: AC
  Administered 2018-12-25: 1000 mg via INTRAVENOUS
  Filled 2018-12-25: qty 200

## 2018-12-25 MED ORDER — SODIUM CHLORIDE 0.9% FLUSH
10.0000 mL | INTRAVENOUS | Status: DC | PRN
  Administered 2019-01-05: 10 mL
  Filled 2018-12-25: qty 40

## 2018-12-25 MED ORDER — WARFARIN SODIUM 7.5 MG PO TABS
7.5000 mg | ORAL_TABLET | Freq: Once | ORAL | Status: DC
  Filled 2018-12-25: qty 1

## 2018-12-25 NOTE — Consult Note (Signed)
Pharmacy Antibiotic Note  Sylvia [name] is a 63 y.o. female admitted on 12/14/2018 with fever.  Pharmacy has been consulted for aztreonam and vancomycin dosing.  Plan: Will continue aztreonam 1 g q8H.   Vancomycin 1750 mg x 1 loading dose.   Will dose by levels and order a 24 hour random level. Scr improving (2.20> 2.09).   9/6 1425 Vancomycin random level returned at 13.  Gave vancomycin 1250 mg x 1 and ordered a 24 hour level.  9/7 1422 VR level returned at 15 Will give vancomycin 1000mg  x 1 and order a 24 hour level   Height: 5\' 7"  (170.2 cm) Weight: 152 lb 12.8 oz (69.3 kg) IBW/kg (Calculated) : 61.6  Temp (24hrs), Avg:99 F (37.2 C), Min:98.1 F (36.7 C), Max:100.3 F (37.9 C)  Recent Labs  Lab 12/21/18 0448  12/22/18 0625 12/22/18 2033 12/23/18 0344 12/24/18 0732 12/24/18 1425 12/24/18 1912 12/25/18 0806 12/25/18 1422  WBC 9.8   < > 13.1* 16.5* 13.7* 19.2*  --  20.9* 21.1*  --   CREATININE 0.79  --  1.38*  --  2.20* 2.09*  --   --  1.62*  --   VANCORANDOM  --   --   --   --   --   --  13  --   --  15   < > = values in this interval not displayed.    Estimated Creatinine Clearance: 34.1 mL/min (A) (by C-G formula based on SCr of 1.62 mg/dL (H)).    Allergies  Allergen Reactions  . Penicillins Hives    Has patient had a PCN reaction causing immediate rash, facial/tongue/throat swelling, SOB or lightheadedness with hypotension: Yes Has patient had a PCN reaction causing severe rash involving mucus membranes or skin necrosis: No Has patient had a PCN reaction that required hospitalization: No Has patient had a PCN reaction occurring within the last 10 years: No If all of the above answers are "NO", then may proceed with Cephalosporin use.  . Tramadol Itching    Antimicrobials this admission: 9/5 pip/tazo >>  9/5 vancomycin >>   Dose adjustments this admission: Dosing vanc per level  Microbiology results: 9/5 BCx: NG x 2 days  Thank you for  allowing pharmacy to be a part of this patient's care.  Lu Duffel, PharmD, BCPS Clinical Pharmacist 12/25/2018 2:52 PM

## 2018-12-25 NOTE — Progress Notes (Signed)
In Progress  12/25/18 6:25 AM [place], Vida Roller, RN  IV team consulted for PIV placement. Both arms assessed with [place]. Recommend a more advanced line. Patient no a candidate for midline.

## 2018-12-25 NOTE — Progress Notes (Signed)
Ch f/u with pt to provide emotional and spiritual support. Pt shared that the procedure that she had on Friday was not successful and that she would have to make a decision on how she would like to move forward with another surgery. Pt was under mild emotional distress but was in a place of acceptance and was grateful for the education that was given to her regarding her possible options. Pt was tearful as she shared about longing to reconnect with her family. Ch suggested that she have a order placed for a tele-visit w/ her sons that are in [place]. Pt shared that she would need time to figure out how to explain to her great-grands where she has been for so long. Ch provided a compassionate presence as the pt shared her grief regarding the lost of her best friend and being reconciling with her siblings. Pt has support of her sister to help her with the decision regarding the POC/GOC.    12/25/18 1100  Clinical Encounter Type  Visited With Patient  Visit Type Social support;Spiritual support;Psychological support;Follow-up;Post-op;Critical Care  Spiritual Encounters  Spiritual Needs Grief support;Emotional  Stress Factors  Patient Stress Factors Loss of control;Major life changes;Health changes;Family relationships  Family Stress Factors Family relationships;Loss;Major life changes

## 2018-12-25 NOTE — Progress Notes (Signed)
[facility]  ROUNDING NOTE   Subjective:  Creatinine now down to 1.62 with a BUN of 28. Good urine output at 5.3 L. Patient appears to be in better spirits today.  Objective:  Vital signs in last 24 hours:  Temp:  [98.1 F (36.7 C)-100.3 F (37.9 C)] 98.8 F (37.1 C) (09/07 0800) Pulse Rate:  [83-115] 101 (09/07 0900) Resp:  [12-22] 14 (09/07 0900) BP: (113-175)/(46-129) 154/61 (09/07 0900) SpO2:  [93 %-100 %] 100 % (09/07 0900) FiO2 (%):  [28 %] 28 % (09/06 1523)  Weight change:  Filed Weights   12/18/18 0414 12/19/18 0409 12/21/18 0453  Weight: 67.4 kg 70.4 kg 69.3 kg    Intake/Output: I/O last 3 completed shifts: In: 4287.3 [P.O.:600; I.V.:2116.4; Blood:784; IV Piggyback:786.9] Out: 6690 [Urine:6690]   Intake/Output this shift:  Total I/O In: -  Out: 250 [Urine:250]  Physical Exam: General: No acute distress  Head: Normocephalic, atraumatic. Moist oral mucosal membranes  Eyes: Anicteric  Neck: Supple, trachea midline  Lungs:  Clear to auscultation, normal effort  Heart: S1S2 no rubs  Abdomen:  Soft, nontender, bowel sounds present  Extremities: Trace peripheral edema.  Neurologic: Awake, alert, following commands  Skin: No lesions       Basic Metabolic Panel: Recent Labs  Lab 12/20/18 0320 12/21/18 0448 12/22/18 0625 12/23/18 0344 12/24/18 0732 12/25/18 0806  NA 141 141 141 141 141 141  K 3.7 3.7 3.5 4.5 4.4 4.4  CL 109 111 114* 112* 114* 111  CO2 24 24 19* 19* 18* 20*  GLUCOSE 120* 148* 192* 178* 117* 104*  BUN 11 13 18  36* 35* 28*  CREATININE 0.73 0.79 1.38* 2.20* 2.09* 1.62*  CALCIUM 8.8* 8.9 7.3* 7.9* 8.1* 8.6*  MG 1.7  --   --   --   --  2.0  PHOS  --   --   --   --   --  4.1    Liver Function Tests: Recent Labs  Lab 12/25/18 0806  AST 117*  ALT 56*  ALKPHOS 104  BILITOT 0.8  PROT 6.6  ALBUMIN 2.6*   No results for input(s): LIPASE, AMYLASE in the last 168 hours. No results for input(s): AMMONIA in the last 168  hours.  CBC: Recent Labs  Lab 12/22/18 2033 12/23/18 0344 12/24/18 0732 12/24/18 1912 12/25/18 0244 12/25/18 0806  WBC 16.5* 13.7* 19.2* 20.9*  --  21.1*  NEUTROABS 13.4* 11.0*  --   --   --   --   HGB 6.9* 7.6* 5.9* 7.2* 7.3* 8.5*  HCT 20.7* 22.8* 18.0* 20.9* 21.0* 25.4*  MCV 85.2 85.4 86.5 85.7  --  87.6  PLT 196 180 240 258  --  331    Cardiac Enzymes: Recent Labs  Lab 12/23/18 1802  CKTOTAL 2,617*    BNP: Invalid input(s): POCBNP  CBG: Recent Labs  Lab 12/24/18 0739 12/24/18 1200 12/24/18 1756 12/24/18 2211 12/25/18 0801  GLUCAP 103* 129* 147* 99 85    Microbiology: Results for orders placed or performed during the hospital encounter of 12/14/18  SARS Coronavirus 2 [facility] order, Performed in [facility] hospital lab) Nasopharyngeal Nasopharyngeal Swab     Status: None   Collection Time: 12/14/18  3:11 PM   Specimen: Nasopharyngeal Swab  Result Value Ref Range Status   SARS Coronavirus 2 NEGATIVE NEGATIVE Final    Comment: (NOTE) If result is NEGATIVE SARS-CoV-2 target nucleic acids are NOT DETECTED. The SARS-CoV-2 RNA is generally detectable in upper and lower  respiratory specimens during the acute phase of infection. The lowest  concentration of SARS-CoV-2 viral copies this assay can detect is 250  copies / mL. A negative result does not preclude SARS-CoV-2 infection  and should not be used as the sole basis for treatment or other  patient management decisions.  A negative result may occur with  improper specimen collection / handling, submission of specimen other  than nasopharyngeal swab, presence of viral mutation(s) within the  areas targeted by this assay, and inadequate number of viral copies  (<250 copies / mL). A negative result must be combined with clinical  observations, patient history, and epidemiological information. If result is POSITIVE SARS-CoV-2 target nucleic acids are DETECTED. The SARS-CoV-2 RNA is generally detectable in  upper and lower  respiratory specimens dur ing the acute phase of infection.  Positive  results are indicative of active infection with SARS-CoV-2.  Clinical  correlation with patient history and other diagnostic information is  necessary to determine patient infection status.  Positive results do  not rule out bacterial infection or co-infection with other viruses. If result is PRESUMPTIVE POSTIVE SARS-CoV-2 nucleic acids MAY BE PRESENT.   A presumptive positive result was obtained on the submitted specimen  and confirmed on repeat testing.  While 2019 novel coronavirus  (SARS-CoV-2) nucleic acids may be present in the submitted sample  additional confirmatory testing may be necessary for epidemiological  and / or clinical management purposes  to differentiate between  SARS-CoV-2 and other Sarbecovirus currently known to infect humans.  If clinically indicated additional testing with an alternate test  methodology [phone]([id number]) is advised. The SARS-CoV-2 RNA is generally  detectable in upper and lower respiratory sp ecimens during the acute  phase of infection. The expected result is Negative. Fact Sheet for Patients:  BoilerBrush.com.cyhttps://www.fda.gov/media/136312/download Fact Sheet for Healthcare Providers: https://pope.com/https://www.fda.gov/media/136313/download This test is not yet approved or cleared by the [place] FDA and has been authorized for detection and/or diagnosis of SARS-CoV-2 by FDA under an Emergency Use Authorization (EUA).  This EUA will remain in effect (meaning this test can be used) for the duration of the COVID-19 declaration under Section 564(b)(1) of the Act, 21 U.S.C. section 360bbb-3(b)(1), unless the authorization is terminated or revoked sooner. Performed at [facility], [address] [place], [place] [address]   CULTURE, BLOOD (ROUTINE X 2) w Reflex to ID Panel     Status: None (Preliminary result)   Collection Time: 12/23/18  3:22 PM   Specimen: BLOOD  Result  Value Ref Range Status   Specimen Description BLOOD BLOOD LEFT HAND  Final   Special Requests   Final    BOTTLES DRAWN AEROBIC AND ANAEROBIC Blood Culture adequate volume   Culture   Final    NO GROWTH 2 DAYS Performed at [facility], [address] [place], [place] [address]    Report Status PENDING  Incomplete  CULTURE, BLOOD (ROUTINE X 2) w Reflex to ID Panel     Status: None (Preliminary result)   Collection Time: 12/23/18  5:05 PM   Specimen: BLOOD  Result Value Ref Range Status   Specimen Description BLOOD RT HAND  Final   Special Requests   Final    BOTTLES DRAWN AEROBIC AND ANAEROBIC Blood Culture adequate volume   Culture   Final    NO GROWTH 2 DAYS Performed at [facility], [address] [place], [place] [address]    Report Status PENDING  Incomplete  MRSA PCR Screening  Status: None   Collection Time: 12/23/18  5:19 PM   Specimen: Nasal Mucosa; Nasopharyngeal  Result Value Ref Range Status   MRSA by PCR NEGATIVE NEGATIVE Final    Comment:        The GeneXpert MRSA Assay (FDA approved for NASAL specimens only), is one component of a comprehensive MRSA colonization surveillance program. It is not intended to diagnose MRSA infection nor to guide or monitor treatment for MRSA infections. Performed at [facility], [address] [place], [place] [address]     Coagulation Studies: Recent Labs    12/23/18 0344 12/24/18 0435 12/25/18 0244  LABPROT 24.1* 19.6* 17.1*  INR 2.2* 1.7* 1.4*    Urinalysis: No results for input(s): COLORURINE, LABSPEC, PHURINE, GLUCOSEU, HGBUR, BILIRUBINUR, KETONESUR, PROTEINUR, UROBILINOGEN, NITRITE, LEUKOCYTESUR in the last 72 hours.  Invalid input(s): APPERANCEUR    Imaging: No results found.   Medications:   . sodium chloride 75 mL/hr at 12/25/18 0600  . aztreonam 1 g (12/25/18 0819)   . sodium chloride   Intravenous Once  . atorvastatin  10 mg Oral Daily  . Chlorhexidine  Gluconate Cloth  6 each Topical Daily  . docusate sodium  100 mg Oral BID  . HYDROmorphone   Intravenous Q4H  . insulin aspart  0-5 Units Subcutaneous QHS  . insulin aspart  0-9 Units Subcutaneous TID WC  . iron polysaccharides  150 mg Oral Daily  . linagliptin  5 mg Oral Daily  . metoCLOPramide  5 mg Oral TID AC  . multivitamin with minerals  1 tablet Oral Daily  . pantoprazole  40 mg Oral Daily  . polyethylene glycol  17 g Oral Daily  . senna  2 tablet Oral Daily  . sodium chloride flush  3 mL Intravenous Q12H  . warfarin  7.5 mg Oral ONCE-1800  . Warfarin - Pharmacist Dosing Inpatient   Does not apply q1800   acetaminophen, ALPRAZolam, bisacodyl, diphenhydrAMINE **OR** diphenhydrAMINE, hydrALAZINE, naloxone **AND** sodium chloride flush, ondansetron (ZOFRAN) IV, ondansetron (ZOFRAN) IV, oxyCODONE, oxyCODONE  Assessment/ Plan:  63 y.o. female with a PMHx of diabetes mellitus type 2, COPD, GERD, hypertension, peripheral vascular disease who was admitted to [facility] on 12/14/2018 for evaluation of abdominal pain, nausea, vomiting, and diarrhea.  1.  Acute renal failure most likely secondary to contrast-induced nephropathy given multiple angiograms however rhabdomyolysis is a consideration as well. 2.  Metabolic acidosis. 3.  Anemia unspecified, may be related to blood loss. 4.  Hypertension.  Plan: Renal function does appear to be improving.  Creatinine down to 1.62.  Urine output good at 5.3 L.  No indication for dialysis.  Vascular surgery following right foot ischemia closely.  Patient not interested in right BKA at this time.  Hemoglobin up to 8.5 posttransfusion.  Continue to monitor renal parameters closely.   LOS: 11 Sylvia [name] 9/7/202011:25 AM

## 2018-12-25 NOTE — Progress Notes (Signed)
[employer] - [place] at [facility]   PATIENT NAME: Sylvia [name]    MR#:  [id number]  DATE OF BIRTH:  1955-09-28  Patient received 2 units of packed RBC transfusion, hemoglobin today is more than 8, no fever, IV antibiotics are using, patient complains of severe right leg pain, right foot is swollen than before, black discoloration of toes on the right side.  Patient is leaning forward for right foot amputation  CHIEF COMPLAINT:   Chief Complaint  Patient presents with  . Abdominal Pain  No shortness of breath. REVIEW OF SYSTEMS:  CONSTITUTIONAL: No fever, fatigue or weakness.  EYES: No blurred or double vision.  EARS, NOSE, AND THROAT: No tinnitus or ear pain.  RESPIRATORY: No cough, shortness of breath, wheezing or hemoptysis.  CARDIOVASCULAR: No chest pain, orthopnea, edema.  GASTROINTESTINAL: No nausea or vomiting.  Has constipation.  GENITOURINARY: No dysuria, hematuria.  ENDOCRINE: No polyuria, nocturia,  HEMATOLOGY: Anemia present    sKIN:  severe right leg pain   pSYCHIATRY: No anxiety or depression.  Right leg pain, swelling, cold to touch.   DRUG ALLERGIES:   Allergies  Allergen Reactions  . Penicillins Hives    Has patient had a PCN reaction causing immediate rash, facial/tongue/throat swelling, SOB or lightheadedness with hypotension: Yes Has patient had a PCN reaction causing severe rash involving mucus membranes or skin necrosis: No Has patient had a PCN reaction that required hospitalization: No Has patient had a PCN reaction occurring within the last 10 years: No If all of the above answers are "NO", then may proceed with Cephalosporin use.  . Tramadol Itching    VITALS:  Blood pressure (!) 150/69, pulse 88, temperature 98.8 F (37.1 C), temperature source Oral, resp. rate 14, height 5\' 7"  (1.702 m), weight 69.3 kg, SpO2 100 %.  PHYSICAL EXAMINATION:  GENERAL:  63 y.o.-year-old patient lying in the bed with no acute distress.   EYES: Pupils equal, round, reactive to light and accommodation. No scleral icterus. Extraocular muscles intact.  HEENT: Head atraumatic, normocephalic. Oropharynx and nasopharynx clear.  NECK:  Supple, no jugular venous distention. No thyroid enlargement, no tenderness.  LUNGS: Clear to auscultation with occasional basilar crepitations present CARDIOVASCULAR: S1, S2 tachycardic l. No murmurs, rubs, or gallops.  ABDOMEN: Soft, nontender, nondistended. Bowel sounds present. No organomegaly or mass.  EXTREMITIES: Patient has gangrenous changes on right foot toes, right foot cool to touch  No obvious hematoma, left foot viable, right foot cold to touch, no palpable pulses. nEUROLOGIC: Cranial nerves II through XII are intact. Muscle strength 4/5 in all extremities. Sensation intact. Gait not checked.  PSYCHIATRIC: The patient is alert and oriented x 3.  SKIN: Right leg pain, swelling, discoloration of the right great toe, fourth toe LABORATORY PANEL:   CBC Recent Labs  Lab 12/25/18 0806  WBC 21.1*  HGB 8.5*  HCT 25.4*  PLT 331   ------------------------------------------------------------------------------------------------------------------  Chemistries  Recent Labs  Lab 12/25/18 0806  NA 141  K 4.4  CL 111  CO2 20*  GLUCOSE 104*  BUN 28*  CREATININE 1.62*  CALCIUM 8.6*  MG 2.0  AST 117*  ALT 56*  ALKPHOS 104  BILITOT 0.8   ------------------------------------------------------------------------------------------------------------------  Cardiac Enzymes No results for input(s): TROPONINI in the last 168 hours. ------------------------------------------------------------------------------------------------------------------  RADIOLOGY:  No results found.  ASSESSMENT AND PLAN:   Active Problems:   DKA (diabetic ketoacidoses) (HCC)  Diabeticr peripheral artery disease status post multiple angiograms to the right leg, developed a fever  2 days ago, gangrene of the  right toes, started on aggressive antibiotics no fever now, patient is offered right BKA as patient continues to have cold right foot despite multiple angiograms, patient has severe right leg pain, started on morphine PCA. Discussed with her at length today about the need for right BKA, appreciate vascular surgery also talking to her about this.  Patient does have fever, gangrene, severe right foot pain, anemia,    Severe PAD, bilateral leg pain, status post right leg angiogram 3 times since his hospitalization, still right foot is not viable evidence of gangrene in the toes, right foot still cold to touch, vascular surgery recommends right BKA but patient refused, Status post left leg angiogram and stenting. Continue Coumadin as per vascular surgery    Acute blood loss anemia, status post 2 units of packed RBC transfusion yesterday hemoglobin improved from 5.9-8.5.  Received 2 units of blood transfusion yesterday.  Essential hypertension.  Continue home meds,    uncontrolled diabetes mellitus type 2: With PAD ; Tradjenta, sliding scale insulin with coverage.      Acute kidney injury, likely secondary to contrast nephropathy, started IV fluids, renal function better, urine output is good. Kidney function is improving, creatinine down to 1.6 from 2.2.   Constipation secondary to narcotics, continue bowel regimen.    All the records are reviewed and case discussed with Care Management/Social Workerr. Management plans discussed with the patient, family and they are in agreement.  CODE STATUS: Full  TOTAL TIME TAKING CARE OF THIS PATIENT: 35 minutes.  Patient condition critical, high risk for cardiac arrest.   More than 50% time spent in counseling, coordination of care   POSSIBLE D/C IN 1-2 DAYS, DEPENDING ON CLINICAL CONDITION.   Epifanio Lesches M.D on 12/25/2018   Between 7am to 6pm - Pager - [phone]  After 6pm go to [email or web address] - password [email or web address]  Office  [phone]  CC: Primary care physician; Center, [place]  Note: This dictation was prepared with Diplomatic Services operational officer dictation along with smaller phrase technology. Any transcriptional errors that result from this process are unintentional.

## 2018-12-25 NOTE — Progress Notes (Signed)
[facility]  Daily Progress Note   Assessment/Planning:   POD #3 s/p B multiple endovascular interventions   R foot ischemia progresses.  Pt still not interested in R BKA  Unclear if pt got 1 or 2 units of pRBC  Post-transfusion H/H has not been drawn.  Regardless BP and HR normalizing.  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  AM BMP pending also to check renal function   Subjective  - 3 Days Post-Op   No change in intensity of pain in R foot   Objective   Vitals:   12/25/18 0500 12/25/18 0600 12/25/18 0700 12/25/18 0800  BP: 122/69  (!) 155/80 (!) 141/68  Pulse: 83 87 96 95  Resp: 14 19 18 12   Temp:    98.8 F (37.1 C)  TempSrc:    Oral  SpO2: 100% 93% 100% 100%  Weight:      Height:         Intake/Output Summary (Last 24 hours) at 12/25/2018 0817 Last data filed at 12/25/2018 0800 Gross per 24 hour  Intake 3199.95 ml  Output 5265 ml  Net -2065.05 ml    PULM  CTAB  CV  RRR  GI  soft, NTND  VASC L foot warm, R foot with frank ischemia at this point  NEURO No motor or sensation in R foot    Laboratory   CBC CBC Latest Ref Rng & Units 12/25/2018 12/24/2018 12/24/2018  WBC 4.0 - 10.5 K/uL - 20.9(H) 19.2(H)  Hemoglobin 12.0 - 15.0 g/dL 7.3(L) 7.2(L) 5.9(L)  Hematocrit 36.0 - 46.0 % 21.0(L) 20.9(L) 18.0(L)  Platelets 150 - 400 K/uL - 258 240    BMET    Component Value Date/Time   NA 141 12/24/2018 0732   K 4.4 12/24/2018 0732   CL 114 (H) 12/24/2018 0732   CO2 18 (L) 12/24/2018 0732   GLUCOSE 117 (H) 12/24/2018 0732   BUN 35 (H) 12/24/2018 0732   CREATININE 2.09 (H) 12/24/2018 0732   CALCIUM 8.1 (L) 12/24/2018 0732   GFRNONAA 24 (L) 12/24/2018 0732   GFRAA 28 (L) 12/24/2018 0732     Adele Barthel, MD, FACS, FSVS Covering for [facility]: [phone]

## 2018-12-25 NOTE — Consult Note (Signed)
ANTICOAGULATION CONSULT NOTE - Initial Consult  Pharmacy Consult for warfarin Indication: Hx of DVT and s/p multiple endovascular interventions.   Allergies  Allergen Reactions  . Penicillins Hives    Has patient had a PCN reaction causing immediate rash, facial/tongue/throat swelling, SOB or lightheadedness with hypotension: Yes Has patient had a PCN reaction causing severe rash involving mucus membranes or skin necrosis: No Has patient had a PCN reaction that required hospitalization: No Has patient had a PCN reaction occurring within the last 10 years: No If all of the above answers are "NO", then may proceed with Cephalosporin use.  . Tramadol Itching    Patient Measurements: Height: 5\' 7"  (170.2 cm) Weight: 152 lb 12.8 oz (69.3 kg) IBW/kg (Calculated) : 61.6  Vital Signs: Temp: 98.8 F (37.1 C) (09/07 0800) Temp Source: Oral (09/07 0800) BP: 154/61 (09/07 0900) Pulse Rate: 101 (09/07 0900)  Labs: Recent Labs    12/23/18 0344 12/23/18 1802 12/24/18 0435 12/24/18 0732 12/24/18 1912 12/25/18 0244 12/25/18 0806  HGB 7.6*  --   --  5.9* 7.2* 7.3* 8.5*  HCT 22.8*  --   --  18.0* 20.9* 21.0* 25.4*  PLT 180  --   --  240 258  --  331  LABPROT 24.1*  --  19.6*  --   --  17.1*  --   INR 2.2*  --  1.7*  --   --  1.4*  --   CREATININE 2.20*  --   --  2.09*  --   --  1.62*  CKTOTAL  --  2,617*  --   --   --   --   --     Estimated Creatinine Clearance: 34.1 mL/min (A) (by C-G formula based on SCr of 1.62 mg/dL (H)).   Medical History: Past Medical History:  Diagnosis Date  . Anxiety    h/o  . COPD (chronic obstructive pulmonary disease) (HCC)   . Diabetes mellitus without complication (HCC)   . GERD (gastroesophageal reflux disease)   . Hypertension    bp under control-off meds since 2019    Medications:  Medications Prior to Admission  Medication Sig Dispense Refill Last Dose  . albuterol (PROVENTIL HFA;VENTOLIN HFA) 108 (90 Base) MCG/ACT inhaler Inhale 2  puffs into the lungs every 6 (six) hours as needed for wheezing or shortness of breath. 1 Inhaler 2 prn at prn  . alum & mag hydroxide-simeth (MAALOX/MYLANTA) 200-200-20 MG/5ML suspension Take 30 mLs by mouth every 4 (four) hours as needed for indigestion or heartburn. 355 mL 0 prn at prn  . atorvastatin (LIPITOR) 10 MG tablet Take 1 tablet (10 mg total) by mouth daily. 90 tablet 3 12/13/2018 at Unknown time  . Biotin 1000 MCG tablet Take 1,000 tablets by mouth daily.    12/13/2018 at Unknown time  . cetirizine (ZYRTEC) 10 MG tablet Take 10 mg by mouth daily.   12/13/2018 at Unknown time  . cholecalciferol (VITAMIN D3) 25 MCG (1000 UT) tablet Take 1,000 Units by mouth daily.   12/13/2018 at Unknown time  . iron polysaccharides (NIFEREX) 150 MG capsule Take 1 capsule (150 mg total) by mouth daily. 30 capsule 3 12/13/2018 at Unknown time  . Multiple Vitamins-Minerals (WOMENS MULTIVITAMIN PO) Take 1 tablet by mouth daily.   12/13/2018 at Unknown time  . pantoprazole (PROTONIX) 40 MG tablet Take 1 tablet (40 mg total) by mouth daily. 30 tablet 0 12/13/2018 at Unknown time  . sitaGLIPtin (JANUVIA) 100 MG tablet Take 100 mg  by mouth daily.   12/13/2018 at Unknown time  . vitamin C (ASCORBIC ACID) 500 MG tablet Take 500 mg by mouth daily.   12/13/2018 at Unknown time  . warfarin (COUMADIN) 5 MG tablet Take 1 tablet (5 mg total) by mouth daily at 6 PM. 30 tablet 5 12/13/2018 at Unknown time   Scheduled:  . sodium chloride   Intravenous Once  . atorvastatin  10 mg Oral Daily  . Chlorhexidine Gluconate Cloth  6 each Topical Daily  . docusate sodium  100 mg Oral BID  . HYDROmorphone   Intravenous Q4H  . insulin aspart  0-5 Units Subcutaneous QHS  . insulin aspart  0-9 Units Subcutaneous TID WC  . iron polysaccharides  150 mg Oral Daily  . linagliptin  5 mg Oral Daily  . metoCLOPramide  5 mg Oral TID AC  . multivitamin with minerals  1 tablet Oral Daily  . pantoprazole  40 mg Oral Daily  . polyethylene glycol   17 g Oral Daily  . senna  2 tablet Oral Daily  . sodium chloride flush  3 mL Intravenous Q12H  . Warfarin - Pharmacist Dosing Inpatient   Does not apply q1800   Infusions:  . sodium chloride 75 mL/hr at 12/25/18 0600  . aztreonam 1 g (12/25/18 0819)   PRN: acetaminophen, ALPRAZolam, bisacodyl, diphenhydrAMINE **OR** diphenhydrAMINE, hydrALAZINE, naloxone **AND** sodium chloride flush, ondansetron (ZOFRAN) IV, ondansetron (ZOFRAN) IV, oxyCODONE, oxyCODONE Anti-infectives (From admission, onward)   Start     Dose/Rate Route Frequency Ordered Stop   12/24/18 1600  vancomycin (VANCOCIN) 1,250 mg in sodium chloride 0.9 % 250 mL IVPB     1,250 mg 166.7 mL/hr over 90 Minutes Intravenous  Once 12/24/18 1502 12/24/18 1839   12/23/18 2200  aztreonam (AZACTAM) 1 g in sodium chloride 0.9 % 100 mL IVPB     1 g 200 mL/hr over 30 Minutes Intravenous Every 8 hours 12/23/18 1522     12/23/18 1600  vancomycin (VANCOCIN) 1,750 mg in sodium chloride 0.9 % 500 mL IVPB     1,750 mg 250 mL/hr over 120 Minutes Intravenous  Once 12/23/18 1517 12/23/18 1859   12/23/18 1530  aztreonam (AZACTAM) 2 g in sodium chloride 0.9 % 100 mL IVPB     2 g 200 mL/hr over 30 Minutes Intravenous  Once 12/23/18 1522 12/23/18 2015   12/22/18 1200  clindamycin (CLEOCIN) IVPB 300 mg     300 mg 100 mL/hr over 30 Minutes Intravenous  Once 12/22/18 1146 12/22/18 1707   12/22/18 1133  clindamycin (CLEOCIN) 300 MG/50ML IVPB    Note to Pharmacy: Jorja Loa   : cabinet override      12/22/18 1133 12/22/18 1145   12/21/18 2215  clindamycin (CLEOCIN) IVPB 300 mg     300 mg 100 mL/hr over 30 Minutes Intravenous  Once 12/21/18 2209 12/22/18 0022   12/21/18 1500  levofloxacin (LEVAQUIN) IVPB 750 mg  Status:  Discontinued     750 mg 100 mL/hr over 90 Minutes Intravenous Daily-1800 12/21/18 1338 12/22/18 1114   12/20/18 2215  clindamycin (CLEOCIN) IVPB 300 mg     300 mg 100 mL/hr over 30 Minutes Intravenous  Once 12/20/18 2207  12/21/18 0850   12/18/18 1600  clindamycin (CLEOCIN) IVPB 300 mg     300 mg 100 mL/hr over 30 Minutes Intravenous  Once 12/18/18 1548 12/18/18 1735   12/18/18 0100  clindamycin (CLEOCIN) IVPB 300 mg     300 mg 100 mL/hr over 30  Minutes Intravenous  Once 12/18/18 0058 12/18/18 0922   12/15/18 2000  ciprofloxacin (CIPRO) IVPB 400 mg  Status:  Discontinued     400 mg 200 mL/hr over 60 Minutes Intravenous Every 12 hours 12/15/18 1811 12/18/18 1138   12/15/18 1800  metroNIDAZOLE (FLAGYL) IVPB 500 mg  Status:  Discontinued     500 mg 100 mL/hr over 60 Minutes Intravenous Every 8 hours 12/15/18 1751 12/18/18 1138      Assessment: Pharmacy underwent multiple endovascular interventions. INR 1.4. Messaged Dr Bridgett Larsson, and he would like to continue warfarin at this time. Pt may need a R BKA vs AKA. 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 Bridgett Larsson. To prevent stents from closing will start warfarin. Pt is on aztreonam and vancomycin.  Home regimen: warfarin 5 mg daily   Date INR Warfarin Dose  9/6 1.7 5 mg  9/7 1.4     Goal of Therapy:  INR 2-3 Monitor platelets by anticoagulation protocol: Yes   Plan:  Subtherapeutic INR 1.4   Will give patient 7.5 mg warfarin tonight (50% > than home dose).   Daily INR ordered. Monitor CBC minimum of q3 days per protocol  Lu Duffel, PharmD, BCPS Clinical Pharmacist 12/25/2018 9:39 AM

## 2018-12-26 ENCOUNTER — Encounter: Payer: Self-pay | Admitting: Vascular Surgery

## 2018-12-26 ENCOUNTER — Inpatient Hospital Stay: Payer: Medicaid Other

## 2018-12-26 LAB — CBC WITH DIFFERENTIAL/PLATELET
Abs Immature Granulocytes: 0.25 10*3/uL — ABNORMAL HIGH (ref 0.00–0.07)
Basophils Absolute: 0.1 10*3/uL (ref 0.0–0.1)
Basophils Relative: 1 %
Eosinophils Absolute: 0.4 10*3/uL (ref 0.0–0.5)
Eosinophils Relative: 3 %
HCT: 20.8 % — ABNORMAL LOW (ref 36.0–46.0)
Hemoglobin: 7 g/dL — ABNORMAL LOW (ref 12.0–15.0)
Immature Granulocytes: 2 %
Lymphocytes Relative: 9 %
Lymphs Abs: 1.4 10*3/uL (ref 0.7–4.0)
MCH: 28.9 pg (ref 26.0–34.0)
MCHC: 33.7 g/dL (ref 30.0–36.0)
MCV: 86 fL (ref 80.0–100.0)
Monocytes Absolute: 1.9 10*3/uL — ABNORMAL HIGH (ref 0.1–1.0)
Monocytes Relative: 12 %
Neutro Abs: 12.4 10*3/uL — ABNORMAL HIGH (ref 1.7–7.7)
Neutrophils Relative %: 73 %
Platelets: 352 10*3/uL (ref 150–400)
RBC: 2.42 MIL/uL — ABNORMAL LOW (ref 3.87–5.11)
RDW: 14.1 % (ref 11.5–15.5)
WBC: 16.5 10*3/uL — ABNORMAL HIGH (ref 4.0–10.5)
nRBC: 0 % (ref 0.0–0.2)

## 2018-12-26 LAB — GLUCOSE, CAPILLARY
Glucose-Capillary: 111 mg/dL — ABNORMAL HIGH (ref 70–99)
Glucose-Capillary: 146 mg/dL — ABNORMAL HIGH (ref 70–99)
Glucose-Capillary: 126 mg/dL — ABNORMAL HIGH (ref 70–99)
Glucose-Capillary: 118 mg/dL — ABNORMAL HIGH (ref 70–99)
Glucose-Capillary: 112 mg/dL — ABNORMAL HIGH (ref 70–99)

## 2018-12-26 LAB — CBC
HCT: 24.6 % — ABNORMAL LOW (ref 36.0–46.0)
Hemoglobin: 8.5 g/dL — ABNORMAL LOW (ref 12.0–15.0)
MCH: 29 pg (ref 26.0–34.0)
MCHC: 34.6 g/dL (ref 30.0–36.0)
MCV: 84 fL (ref 80.0–100.0)
Platelets: 398 10*3/uL (ref 150–400)
RBC: 2.93 MIL/uL — ABNORMAL LOW (ref 3.87–5.11)
RDW: 14.3 % (ref 11.5–15.5)
WBC: 17.2 10*3/uL — ABNORMAL HIGH (ref 4.0–10.5)
nRBC: 0 % (ref 0.0–0.2)

## 2018-12-26 LAB — HEPARIN LEVEL (UNFRACTIONATED)
Heparin Unfractionated: 0.11 IU/mL — ABNORMAL LOW (ref 0.30–0.70)
Heparin Unfractionated: 0.21 IU/mL — ABNORMAL LOW (ref 0.30–0.70)

## 2018-12-26 LAB — BASIC METABOLIC PANEL
Anion gap: 7 (ref 5–15)
BUN: 23 mg/dL (ref 8–23)
CO2: 21 mmol/L — ABNORMAL LOW (ref 22–32)
Calcium: 8.4 mg/dL — ABNORMAL LOW (ref 8.9–10.3)
Chloride: 111 mmol/L (ref 98–111)
Creatinine, Ser: 1.29 mg/dL — ABNORMAL HIGH (ref 0.44–1.00)
GFR calc Af Amer: 51 mL/min — ABNORMAL LOW (ref >60)
GFR calc non Af Amer: 44 mL/min — ABNORMAL LOW (ref >60)
Glucose, Bld: 116 mg/dL — ABNORMAL HIGH (ref 70–99)
Potassium: 4 mmol/L (ref 3.5–5.1)
Sodium: 139 mmol/L (ref 135–145)

## 2018-12-26 LAB — PROTIME-INR
INR: 1.4 — ABNORMAL HIGH (ref 0.8–1.2)
Prothrombin Time: 17.2 seconds — ABNORMAL HIGH (ref 11.4–15.2)

## 2018-12-26 LAB — MYOGLOBIN, SERUM: Myoglobin: 1604 ng/mL — ABNORMAL HIGH (ref 25–58)

## 2018-12-26 LAB — VANCOMYCIN, RANDOM: Vancomycin Rm: 13

## 2018-12-26 MED ORDER — FUROSEMIDE 10 MG/ML IJ SOLN
20.0000 mg | Freq: Once | INTRAMUSCULAR | Status: AC
  Administered 2018-12-26: 20 mg via INTRAVENOUS
  Filled 2018-12-26: qty 2

## 2018-12-26 MED ORDER — ACETAMINOPHEN 325 MG PO TABS
650.0000 mg | ORAL_TABLET | Freq: Once | ORAL | Status: AC
  Administered 2018-12-26: 650 mg via ORAL
  Filled 2018-12-26: qty 2

## 2018-12-26 MED ORDER — DIPHENHYDRAMINE HCL 50 MG/ML IJ SOLN
25.0000 mg | Freq: Once | INTRAMUSCULAR | Status: AC
  Administered 2018-12-26: 25 mg via INTRAVENOUS
  Filled 2018-12-26: qty 1

## 2018-12-26 MED ORDER — HEPARIN (PORCINE) 25000 UT/250ML-% IV SOLN
1250.0000 [IU]/h | INTRAVENOUS | Status: DC
  Administered 2018-12-26: 1100 [IU]/h via INTRAVENOUS
  Filled 2018-12-26: qty 250

## 2018-12-26 MED ORDER — SODIUM CHLORIDE 0.9 % IV SOLN: INTRAVENOUS | Status: DC

## 2018-12-26 MED ORDER — SODIUM CHLORIDE 0.9% IV SOLUTION
Freq: Once | INTRAVENOUS | Status: AC
  Administered 2018-12-26: 12:00:00 via INTRAVENOUS

## 2018-12-26 MED ORDER — VANCOMYCIN HCL IN DEXTROSE 1-5 GM/200ML-% IV SOLN
1000.0000 mg | INTRAVENOUS | Status: DC
  Administered 2018-12-26 – 2018-12-29 (×4): 1000 mg via INTRAVENOUS
  Filled 2018-12-26 (×6): qty 200

## 2018-12-26 NOTE — Consult Note (Signed)
ANTICOAGULATION CONSULT NOTE - Initial Consult  Pharmacy Consult for Heparin Indication: Hx of DVT and s/p multiple endovascular interventions.   Allergies  Allergen Reactions  . Penicillins Hives    Has patient had a PCN reaction causing immediate rash, facial/tongue/throat swelling, SOB or lightheadedness with hypotension: Yes Has patient had a PCN reaction causing severe rash involving mucus membranes or skin necrosis: No Has patient had a PCN reaction that required hospitalization: No Has patient had a PCN reaction occurring within the last 10 years: No If all of the above answers are "NO", then may proceed with Cephalosporin use.  . Tramadol Itching    Patient Measurements: Height: 5\' 7"  (170.2 cm) Weight: 152 lb 12.8 oz (69.3 kg) IBW/kg (Calculated) : 61.6  Heparin dosing weight:  69.3 kg  Vital Signs: Temp: 99.1 F (37.3 C) (09/08 1600) Temp Source: Oral (09/08 1600) BP: 187/138 (09/08 1800) Pulse Rate: 106 (09/08 1800)  Labs: Recent Labs    12/24/18 0435 12/24/18 0732  12/25/18 0244 12/25/18 0806 12/26/18 0328 12/26/18 1809  HGB  --  5.9*   < > 7.3* 8.5* 7.0* 8.5*  HCT  --  18.0*   < > 21.0* 25.4* 20.8* 24.6*  PLT  --  240   < >  --  331 352 398  LABPROT 19.6*  --   --  17.1*  --  17.2*  --   INR 1.7*  --   --  1.4*  --  1.4*  --   HEPARINUNFRC  --   --   --   --   --   --  0.11*  CREATININE  --  2.09*  --   --  1.62* 1.29*  --    < > = values in this interval not displayed.    Estimated Creatinine Clearance: 42.8 mL/min (A) (by C-G formula based on SCr of 1.29 mg/dL (H)).   Medical History: Past Medical History:  Diagnosis Date  . Anxiety    h/o  . COPD (chronic obstructive pulmonary disease) (HCC)   . Diabetes mellitus without complication (HCC)   . GERD (gastroesophageal reflux disease)   . Hypertension    bp under control-off meds since 2019    Medications:  Medications Prior to Admission  Medication Sig Dispense Refill Last Dose  .  albuterol (PROVENTIL HFA;VENTOLIN HFA) 108 (90 Base) MCG/ACT inhaler Inhale 2 puffs into the lungs every 6 (six) hours as needed for wheezing or shortness of breath. 1 Inhaler 2 prn at prn  . alum & mag hydroxide-simeth (MAALOX/MYLANTA) 200-200-20 MG/5ML suspension Take 30 mLs by mouth every 4 (four) hours as needed for indigestion or heartburn. 355 mL 0 prn at prn  . atorvastatin (LIPITOR) 10 MG tablet Take 1 tablet (10 mg total) by mouth daily. 90 tablet 3 12/13/2018 at Unknown time  . Biotin 1000 MCG tablet Take 1,000 tablets by mouth daily.    12/13/2018 at Unknown time  . cetirizine (ZYRTEC) 10 MG tablet Take 10 mg by mouth daily.   12/13/2018 at Unknown time  . cholecalciferol (VITAMIN D3) 25 MCG (1000 UT) tablet Take 1,000 Units by mouth daily.   12/13/2018 at Unknown time  . iron polysaccharides (NIFEREX) 150 MG capsule Take 1 capsule (150 mg total) by mouth daily. 30 capsule 3 12/13/2018 at Unknown time  . Multiple Vitamins-Minerals (WOMENS MULTIVITAMIN PO) Take 1 tablet by mouth daily.   12/13/2018 at Unknown time  . pantoprazole (PROTONIX) 40 MG tablet Take 1 tablet (40 mg total)  by mouth daily. 30 tablet 0 12/13/2018 at Unknown time  . sitaGLIPtin (JANUVIA) 100 MG tablet Take 100 mg by mouth daily.   12/13/2018 at Unknown time  . vitamin C (ASCORBIC ACID) 500 MG tablet Take 500 mg by mouth daily.   12/13/2018 at Unknown time  . warfarin (COUMADIN) 5 MG tablet Take 1 tablet (5 mg total) by mouth daily at 6 PM. 30 tablet 5 12/13/2018 at Unknown time   Scheduled:  . sodium chloride   Intravenous Once  . atorvastatin  10 mg Oral Daily  . Chlorhexidine Gluconate Cloth  6 each Topical Daily  . docusate sodium  100 mg Oral BID  . HYDROmorphone   Intravenous Q4H  . insulin aspart  0-5 Units Subcutaneous QHS  . insulin aspart  0-9 Units Subcutaneous TID WC  . iron polysaccharides  150 mg Oral Daily  . linagliptin  5 mg Oral Daily  . metoCLOPramide  5 mg Oral TID AC  . multivitamin with minerals  1  tablet Oral Daily  . pantoprazole  40 mg Oral Daily  . polyethylene glycol  17 g Oral Daily  . senna  2 tablet Oral Daily  . sodium chloride flush  10-40 mL Intracatheter Q12H  . sodium chloride flush  3 mL Intravenous Q12H   Infusions:  . sodium chloride Stopped (12/26/18 1226)  . aztreonam Stopped (12/26/18 1613)  . heparin 1,100 Units/hr (12/26/18 1755)  . vancomycin 200 mL/hr at 12/26/18 1755   PRN: acetaminophen, ALPRAZolam, bisacodyl, diphenhydrAMINE **OR** diphenhydrAMINE, hydrALAZINE, naloxone **AND** sodium chloride flush, ondansetron (ZOFRAN) IV, ondansetron (ZOFRAN) IV, oxyCODONE, oxyCODONE, sodium chloride flush Anti-infectives (From admission, onward)   Start     Dose/Rate Route Frequency Ordered Stop   12/26/18 1630  vancomycin (VANCOCIN) IVPB 1000 mg/200 mL premix     1,000 mg 200 mL/hr over 60 Minutes Intravenous Every 24 hours 12/26/18 1606     12/25/18 1500  vancomycin (VANCOCIN) IVPB 1000 mg/200 mL premix     1,000 mg 200 mL/hr over 60 Minutes Intravenous  Once 12/25/18 1456 12/25/18 1755   12/24/18 1600  vancomycin (VANCOCIN) 1,250 mg in sodium chloride 0.9 % 250 mL IVPB     1,250 mg 166.7 mL/hr over 90 Minutes Intravenous  Once 12/24/18 1502 12/24/18 1839   12/23/18 2200  aztreonam (AZACTAM) 1 g in sodium chloride 0.9 % 100 mL IVPB     1 g 200 mL/hr over 30 Minutes Intravenous Every 8 hours 12/23/18 1522     12/23/18 1600  vancomycin (VANCOCIN) 1,750 mg in sodium chloride 0.9 % 500 mL IVPB     1,750 mg 250 mL/hr over 120 Minutes Intravenous  Once 12/23/18 1517 12/23/18 1859   12/23/18 1530  aztreonam (AZACTAM) 2 g in sodium chloride 0.9 % 100 mL IVPB     2 g 200 mL/hr over 30 Minutes Intravenous  Once 12/23/18 1522 12/23/18 2015   12/22/18 1200  clindamycin (CLEOCIN) IVPB 300 mg     300 mg 100 mL/hr over 30 Minutes Intravenous  Once 12/22/18 1146 12/22/18 1707   12/22/18 1133  clindamycin (CLEOCIN) 300 MG/50ML IVPB    Note to Pharmacy: Rogelia Mire   :  cabinet override      12/22/18 1133 12/22/18 1145   12/21/18 2215  clindamycin (CLEOCIN) IVPB 300 mg     300 mg 100 mL/hr over 30 Minutes Intravenous  Once 12/21/18 2209 12/22/18 0022   12/21/18 1500  levofloxacin (LEVAQUIN) IVPB 750 mg  Status:  Discontinued  750 mg 100 mL/hr over 90 Minutes Intravenous Daily-1800 12/21/18 1338 12/22/18 1114   12/20/18 2215  clindamycin (CLEOCIN) IVPB 300 mg     300 mg 100 mL/hr over 30 Minutes Intravenous  Once 12/20/18 2207 12/21/18 0850   12/18/18 1600  clindamycin (CLEOCIN) IVPB 300 mg     300 mg 100 mL/hr over 30 Minutes Intravenous  Once 12/18/18 1548 12/18/18 1735   12/18/18 0100  clindamycin (CLEOCIN) IVPB 300 mg     300 mg 100 mL/hr over 30 Minutes Intravenous  Once 12/18/18 0058 12/18/18 0922   12/15/18 2000  ciprofloxacin (CIPRO) IVPB 400 mg  Status:  Discontinued     400 mg 200 mL/hr over 60 Minutes Intravenous Every 12 hours 12/15/18 1811 12/18/18 1138   12/15/18 1800  metroNIDAZOLE (FLAGYL) IVPB 500 mg  Status:  Discontinued     500 mg 100 mL/hr over 60 Minutes Intravenous Every 8 hours 12/15/18 1751 12/18/18 1138      Assessment:  63 y/o F with PAD and diabetes on warfarin for history of DVT s/p multiple endovascular interventions. Patient was off warfarin for 2 weeks prior to admission for recent GI bleed. Last dose of warfarin 9/7 with difficulty reaching goal INR of 2-3. INR was sub-therapeutic at 1.4 this morning. Plan for right BKA 9/9 due to ischemic foot. Patient will be transitioned to heparin pending surgical intervention. 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 Heparin drip to be stopped at 7am on 9/9 per vascular.  Patient with unspecified anemia possibly due to blood loss. Patient is s/p 4 units PRBC this admission with last transfusion of 2 units on 9/6. Hgb was 7 this morning and PLT WNL.    Goal of Therapy:  Heparin level 0.3-0.7  units/ml Monitor platelets by anticoagulation protocol: Yes   Plan:  Heparin drip ordered to start at 1400, started ~ 1600. HL ordered for 2000 drawn at 1800. This represents an approximate 2 hour level. Will reorder heparin level for 2200 which will be ~ 6 hrs from time heparin drip started.  Plan to stop heparin drip in the morning prior to procedure.  Pricilla Riffle, PharmD Clinical Pharmacist 12/26/2018 7:57 PM

## 2018-12-26 NOTE — Progress Notes (Signed)
[facility] [place] NAME: Kolina Kube    MR#:  [id number]  DATE OF BIRTH:  Mar 07, 1956  Patient received 2 units of packed RBC transfusion, hemoglobin today is more than 8, no fever, IV antibiotics are using, patient complains of severe right leg pain, right foot is swollen than before, black discoloration of toes on the right side.  Patient is leaning forward for right foot amputation  CHIEF COMPLAINT:   Chief Complaint  Patient presents with  . Abdominal Pain  Continues to have pain in the leg REVIEW OF SYSTEMS:  CONSTITUTIONAL: No fever, fatigue or weakness.  EYES: No blurred or double vision.  EARS, NOSE, AND THROAT: No tinnitus or ear pain.  RESPIRATORY: No cough, shortness of breath, wheezing or hemoptysis.  CARDIOVASCULAR: No chest pain, orthopnea, edema.  GASTROINTESTINAL: No nausea or vomiting.  Has constipation.  GENITOURINARY: No dysuria, hematuria.  ENDOCRINE: No polyuria, nocturia,  HEMATOLOGY: Anemia present  sKIN:  severe right leg pain  pSYCHIATRY: No anxiety or depression.  Right leg pain, swelling, cold to touch.   DRUG ALLERGIES:   Allergies  Allergen Reactions  . Penicillins Hives    Has patient had a PCN reaction causing immediate rash, facial/tongue/throat swelling, SOB or lightheadedness with hypotension: Yes Has patient had a PCN reaction causing severe rash involving mucus membranes or skin necrosis: No Has patient had a PCN reaction that required hospitalization: No Has patient had a PCN reaction occurring within the last 10 years: No If all of the above answers are "NO", then may proceed with Cephalosporin use.  . Tramadol Itching    VITALS:  Blood pressure (!) 161/65, pulse (!) 119, temperature 98.9 F (37.2 C), temperature source Oral, resp. rate 18, height 5\' 7"  (1.702 m), weight 69.3 kg, SpO2 100 %.  PHYSICAL EXAMINATION:  GENERAL:  63 y.o.-year-old patient lying in the bed with no acute  distress.  EYES: Pupils equal, round, reactive to light and accommodation. No scleral icterus. Extraocular muscles intact.  HEENT: Head atraumatic, normocephalic. Oropharynx and nasopharynx clear.  NECK:  Supple, no jugular venous distention. No thyroid enlargement, no tenderness.  LUNGS: Clear to auscultation with occasional basilar crepitations present CARDIOVASCULAR: S1, S2 tachycardic l. No murmurs, rubs, or gallops.  ABDOMEN: Soft, nontender, nondistended. Bowel sounds present. No organomegaly or mass.  EXTREMITIES: Patient has gangrenous changes on right foot toes, right foot cool to touch  No obvious hematoma, left foot viable, right foot cold to touch, no palpable pulses. nEUROLOGIC: Cranial nerves II through XII are intact. Muscle strength 4/5 in all extremities. Sensation intact. Gait not checked.  PSYCHIATRIC: The patient is alert and oriented x 3.  SKIN: Right leg pain, swelling, discoloration of the right great toe, fourth toe LABORATORY PANEL:   CBC Recent Labs  Lab 12/26/18 0328  WBC 16.5*  HGB 7.0*  HCT 20.8*  PLT 352   ------------------------------------------------------------------------------------------------------------------  Chemistries  Recent Labs  Lab 12/25/18 0806 12/26/18 0328  NA 141 139  K 4.4 4.0  CL 111 111  CO2 20* 21*  GLUCOSE 104* 116*  BUN 28* 23  CREATININE 1.62* 1.29*  CALCIUM 8.6* 8.4*  MG 2.0  --   AST 117*  --   ALT 56*  --   ALKPHOS 104  --   BILITOT 0.8  --    ------------------------------------------------------------------------------------------------------------------  Cardiac Enzymes No results for input(s): TROPONINI in the last 168 hours. ------------------------------------------------------------------------------------------------------------------  RADIOLOGY:  US Arterial Lower Extremity Duplex Right(non-abi)  Result Date: 12/26/2018 CLINICAL DATA:  Right leg pain EXAM: RIGHT LOWER EXTREMITY ARTERIAL DUPLEX  SCAN TECHNIQUE: Gray-scale sonography as well as color Doppler and duplex ultrasound was performed to evaluate the lower extremity arteries including the common, superficial and profunda femoral arteries, popliteal artery and calf arteries. COMPARISON:  None. FINDINGS: Right lower Extremity ABI: Not calculated Inflow: Mild plaque through the common femoral artery. Multiphasic waveform. Outflow: Stent from the proximal SFA which is occluded. Occlusion extends through the popliteal artery. Runoff: No flow signal in posterior tibial or peroneal arteries. Reconstituted monophasic slow flow in the anterior tibial artery. IMPRESSION: 1. Long segment right SFA and popliteal arterial occlusion with reconstitution of anterior tibial flow. Electronically Signed   By: Corlis Leak M.D.   On: 12/26/2018 08:45    ASSESSMENT AND PLAN:   Active Problems:   DKA (diabetic ketoacidoses) (HCC)  Severe peripheral artery disease related to diabetes status post multiple angiograms to the right leg, has failed all management plan for amputation Continue heparin therapy  Acute blood loss anemia, status post 2 units of packed RBC transfusion hemoglobin 7.0 will likely need further transfusion prior to surgery  Essential hypertension.  Continue home meds,   uncontrolled diabetes mellitus type 2: With PAD ; Tradjenta, sliding scale insulin with coverage.    Acute kidney injury, likely secondary to contrast nephropathy, appreciate nephrology input     All the records are reviewed and case discussed with Care Management/Social Workerr. Management plans discussed with the patient, family and they are in agreement.  CODE STATUS: Full  TOTAL TIME TAKING CARE OF THIS PATIENT: 35 minutes.  Patient condition critical, high risk for cardiac arrest.   More than 50% time spent in counseling, coordination of care   POSSIBLE D/C IN 1-2 DAYS, DEPENDING ON CLINICAL CONDITION.   Auburn Bilberry M.D on 12/26/2018   Between  7am to 6pm - Pager - [phone]  After 6pm go to [email or web address] - Social research officer, government  [facility]  Office  [phone]  CC: Primary care physician; Center, [facility]  Note: This dictation was prepared with Nurse, children's dictation along with smaller phrase technology. Any transcriptional errors that result from this process are unintentional.

## 2018-12-26 NOTE — Plan of Care (Addendum)
After further assessment and discussion of patient's  status and medical condition during multidisciplinary rounds, the plan is outlined as below:   1.  Discussed during multidisciplinary rounds  2.  Glycemic control adequate at present.  On as needed antihypertensives.  3.  Per vascular surgery patient to have RIGHT BKA tomorrow.  4.  Transfusion today in anticipation for OR tomorrow.  5.  PCCM aware of patient in stepdown.   Renold Don, MD [facility]

## 2018-12-26 NOTE — Consult Note (Signed)
Pharmacy Antibiotic Note  Sylvia [name] is a 63 y.o. female admitted on 12/14/2018 with fever and leukocytosis after multiple invasive procedures. Patient with allergy to PCN and unable to determine if received a cephalosporin in the past. Pharmacy has been consulted for aztreonam and vancomycin dosing.  Scr improved to 1.29 today. Today is day 4 of antibiotics. WBC trending down but with recent fever of 100.5 overnight.   Plan: Continue aztreonam 1 g q8H.   Start regimen of vancomycin 1g q24h Goal AUC 400-550. Expected AUC: 470.3 SCr used: 1.29  Will continue to monitor daily serum creatinine per protocol and adjust dosing as indicated.   Height: 5\' 7"  (170.2 cm) Weight: 152 lb 12.8 oz (69.3 kg) IBW/kg (Calculated) : 61.6  Temp (24hrs), Avg:99.5 F (37.5 C), Min:98.9 F (37.2 C), Max:100.5 F (38.1 C)  Recent Labs  Lab 12/22/18 0625  12/23/18 0344 12/24/18 0732 12/24/18 1425 12/24/18 1912 12/25/18 0806 12/25/18 1422 12/26/18 0328  WBC 13.1*   < > 13.7* 19.2*  --  20.9* 21.1*  --  16.5*  CREATININE 1.38*  --  2.20* 2.09*  --   --  1.62*  --  1.29*  VANCORANDOM  --   --   --   --  13  --   --  15  --    < > = values in this interval not displayed.    Estimated Creatinine Clearance: 42.8 mL/min (A) (by C-G formula based on SCr of 1.29 mg/dL (H)).    Allergies  Allergen Reactions  . Penicillins Hives    Has patient had a PCN reaction causing immediate rash, facial/tongue/throat swelling, SOB or lightheadedness with hypotension: Yes Has patient had a PCN reaction causing severe rash involving mucus membranes or skin necrosis: No Has patient had a PCN reaction that required hospitalization: No Has patient had a PCN reaction occurring within the last 10 years: No If all of the above answers are "NO", then may proceed with Cephalosporin use.  . Tramadol Itching    Antimicrobials this admission: 9/5 aztreonam >>  9/5 vancomycin >>   Dose adjustments this  admission: Creatinine improving and trending towards baseline. Will start vancomycin regimen per AUC dosing kinetics.  Microbiology results: 9/5 BCx: NGTD 9/5 MRSA PCR: negative  Thank you for allowing pharmacy to be a part of this patient's care.  [place] Resident 12/26/2018 3:50 PM

## 2018-12-26 NOTE — Progress Notes (Signed)
[facility] Daily Progress Note   Subjective: 12/19/18: 1.Ultrasound guidance for vascular accessleftfemoral artery 2.Catheter placement into right common femoral artery from left femoral approach 3.Aortogram and selectiverightlower extremity angiogram 4.Mechanical thrombectomy with penumbra CAT 6 device to the right SFA, popliteal artery, tibioperoneal trunk, and proximal posterior tibial arteries 5.Percutaneous transluminal angioplasty of the right tibioperoneal trunk and posterior tibial artery with 3 mm diameter by 22 cm length angioplasty balloon 6.Percutaneous transluminal angioplasty of the right popliteal artery with 4 mm diameter by 22 cm length [name] 7.Percutaneous transluminal angioplasty of the right SFA with 5 mm diameter by 30 cm length [name] 8.Placement of 8 mg of TPA to the right SFA, popliteal artery, and tibioperoneal trunk as well as placement of an infusion catheter for continuous thrombolytic therapy following the procedure using a 130 cm total length 50 cm working length lysis catheter  With take back to endovascular suite the same day for:  1.RLE angiogram 2.Catheter placement into right peroneal artery from left femoral approach 3.Mechanical thrombectomy to the right SFA, popliteal artery, tibioperoneal trunk, and peroneal artery 4.Percutaneous transluminal angioplasty ofthe right peroneal artery and tibioperoneal trunk with 2.5 mm diameter angioplasty balloon 5.Percutaneous transluminal angioplasty of the right popliteal artery with 4 mm diameter [name] 6.Percutaneous transluminal angioplasty of the right proximal SFA with 5 mm diameter Lutonix  drug-coated angioplasty balloon 7.Viabahnstent placement to the right popliteal artery with 5 mm diameter by 10 cm length stent 8.StarClose closure device leftfemoral artery  Patient is now consenting to a right below the knee amputation.  Objective: Vitals:   12/26/18 0900 12/26/18 0909 12/26/18 1000 12/26/18 1100  BP: (!) 181/68  (!) 190/83 (!) 179/72  Pulse: (!) 106  (!) 116 (!) 104  Resp: (!) 24 (!) 23 17 15   Temp:      TempSrc:      SpO2: 100% 100% 100% 100%  Weight:      Height:        Intake/Output Summary (Last 24 hours) at 12/26/2018 1132 Last data filed at 12/26/2018 0912 Gross per 24 hour  Intake 2373.69 ml  Output 3926 ml  Net -1552.31 ml   Physical Exam: A&Ox3, NAD CV: RRR Pulmonary: CTA Bilaterally Abdomen: Soft, Nontender, Nondistended Vascular:  Right lower extremity: Thigh soft.  Calf soft.  Right foot is cold. Minimal motor / sensation intact.  No palpable pedal pulses.   Laboratory: CBC    Component Value Date/Time   WBC 16.5 (H) 12/26/2018 0328   HGB 7.0 (L) 12/26/2018 0328   HCT 20.8 (L) 12/26/2018 0328   PLT 352 12/26/2018 0328   BMET    Component Value Date/Time   NA 139 12/26/2018 0328   K 4.0 12/26/2018 0328   CL 111 12/26/2018 0328   CO2 21 (L) 12/26/2018 0328   GLUCOSE 116 (H) 12/26/2018 0328   BUN 23 12/26/2018 0328   CREATININE 1.29 (H) 12/26/2018 0328   CALCIUM 8.4 (L) 12/26/2018 0328   GFRNONAA 44 (L) 12/26/2018 0328   GFRAA 51 (L) 12/26/2018 0328   Assessment/Planning: The patient is a 63 year old female with a known past medical history of peripheral artery disease requiring multiple endovascular interventions however is now progressed unfortunately to a nonviable right foot 1) plan for right below the knee amputation with Dr. 77 tomorrow 2) will transfuse 1 unit packed red blood cells today.  Follow-up post-transfusion CBC 3) 2 units packed red blood cells on hold for surgery  tomorrow 4)  Coumadin on hold today.  We will start heparin to stop at 7 AM tomorrow  Discussed with Eliot Ford PA-C 12/26/2018 11:32 AM

## 2018-12-26 NOTE — Consult Note (Signed)
ANTICOAGULATION CONSULT NOTE - Initial Consult  Pharmacy Consult for Heparin Indication: Hx of DVT and s/p multiple endovascular interventions.   Allergies  Allergen Reactions  . Penicillins Hives    Has patient had a PCN reaction causing immediate rash, facial/tongue/throat swelling, SOB or lightheadedness with hypotension: Yes Has patient had a PCN reaction causing severe rash involving mucus membranes or skin necrosis: No Has patient had a PCN reaction that required hospitalization: No Has patient had a PCN reaction occurring within the last 10 years: No If all of the above answers are "NO", then may proceed with Cephalosporin use.  . Tramadol Itching    Patient Measurements: Height: 5\' 7"  (170.2 cm) Weight: 152 lb 12.8 oz (69.3 kg) IBW/kg (Calculated) : 61.6  Heparin dosing weight:  69.3 kg  Vital Signs: Temp: 98.9 F (37.2 C) (09/08 1320) Temp Source: Oral (09/08 1320) BP: 161/65 (09/08 1400) Pulse Rate: 119 (09/08 1400)  Labs: Recent Labs    12/23/18 1802 12/24/18 0435  12/24/18 0732 12/24/18 1912 12/25/18 0244 12/25/18 0806 12/26/18 0328  HGB  --   --    < > 5.9* 7.2* 7.3* 8.5* 7.0*  HCT  --   --    < > 18.0* 20.9* 21.0* 25.4* 20.8*  PLT  --   --    < > 240 258  --  331 352  LABPROT  --  19.6*  --   --   --  17.1*  --  17.2*  INR  --  1.7*  --   --   --  1.4*  --  1.4*  CREATININE  --   --   --  2.09*  --   --  1.62* 1.29*  CKTOTAL 2,617*  --   --   --   --   --   --   --    < > = values in this interval not displayed.    Estimated Creatinine Clearance: 42.8 mL/min (A) (by C-G formula based on SCr of 1.29 mg/dL (H)).   Medical History: Past Medical History:  Diagnosis Date  . Anxiety    h/o  . COPD (chronic obstructive pulmonary disease) (HCC)   . Diabetes mellitus without complication (HCC)   . GERD (gastroesophageal reflux disease)   . Hypertension    bp under control-off meds since 2019    Medications:  Medications Prior to Admission   Medication Sig Dispense Refill Last Dose  . albuterol (PROVENTIL HFA;VENTOLIN HFA) 108 (90 Base) MCG/ACT inhaler Inhale 2 puffs into the lungs every 6 (six) hours as needed for wheezing or shortness of breath. 1 Inhaler 2 prn at prn  . alum & mag hydroxide-simeth (MAALOX/MYLANTA) 200-200-20 MG/5ML suspension Take 30 mLs by mouth every 4 (four) hours as needed for indigestion or heartburn. 355 mL 0 prn at prn  . atorvastatin (LIPITOR) 10 MG tablet Take 1 tablet (10 mg total) by mouth daily. 90 tablet 3 12/13/2018 at Unknown time  . Biotin 1000 MCG tablet Take 1,000 tablets by mouth daily.    12/13/2018 at Unknown time  . cetirizine (ZYRTEC) 10 MG tablet Take 10 mg by mouth daily.   12/13/2018 at Unknown time  . cholecalciferol (VITAMIN D3) 25 MCG (1000 UT) tablet Take 1,000 Units by mouth daily.   12/13/2018 at Unknown time  . iron polysaccharides (NIFEREX) 150 MG capsule Take 1 capsule (150 mg total) by mouth daily. 30 capsule 3 12/13/2018 at Unknown time  . Multiple Vitamins-Minerals (WOMENS MULTIVITAMIN PO) Take 1  tablet by mouth daily.   12/13/2018 at Unknown time  . pantoprazole (PROTONIX) 40 MG tablet Take 1 tablet (40 mg total) by mouth daily. 30 tablet 0 12/13/2018 at Unknown time  . sitaGLIPtin (JANUVIA) 100 MG tablet Take 100 mg by mouth daily.   12/13/2018 at Unknown time  . vitamin C (ASCORBIC ACID) 500 MG tablet Take 500 mg by mouth daily.   12/13/2018 at Unknown time  . warfarin (COUMADIN) 5 MG tablet Take 1 tablet (5 mg total) by mouth daily at 6 PM. 30 tablet 5 12/13/2018 at Unknown time   Scheduled:  . sodium chloride   Intravenous Once  . atorvastatin  10 mg Oral Daily  . Chlorhexidine Gluconate Cloth  6 each Topical Daily  . docusate sodium  100 mg Oral BID  . furosemide  20 mg Intravenous Once  . HYDROmorphone   Intravenous Q4H  . insulin aspart  0-5 Units Subcutaneous QHS  . insulin aspart  0-9 Units Subcutaneous TID WC  . iron polysaccharides  150 mg Oral Daily  . linagliptin  5  mg Oral Daily  . metoCLOPramide  5 mg Oral TID AC  . multivitamin with minerals  1 tablet Oral Daily  . pantoprazole  40 mg Oral Daily  . polyethylene glycol  17 g Oral Daily  . senna  2 tablet Oral Daily  . sodium chloride flush  10-40 mL Intracatheter Q12H  . sodium chloride flush  3 mL Intravenous Q12H   Infusions:  . sodium chloride Stopped (12/26/18 1226)  . aztreonam 1 g (12/26/18 0900)  . heparin     PRN: acetaminophen, ALPRAZolam, bisacodyl, diphenhydrAMINE **OR** diphenhydrAMINE, hydrALAZINE, naloxone **AND** sodium chloride flush, ondansetron (ZOFRAN) IV, ondansetron (ZOFRAN) IV, oxyCODONE, oxyCODONE, sodium chloride flush Anti-infectives (From admission, onward)   Start     Dose/Rate Route Frequency Ordered Stop   12/25/18 1500  vancomycin (VANCOCIN) IVPB 1000 mg/200 mL premix     1,000 mg 200 mL/hr over 60 Minutes Intravenous  Once 12/25/18 1456 12/25/18 1755   12/24/18 1600  vancomycin (VANCOCIN) 1,250 mg in sodium chloride 0.9 % 250 mL IVPB     1,250 mg 166.7 mL/hr over 90 Minutes Intravenous  Once 12/24/18 1502 12/24/18 1839   12/23/18 2200  aztreonam (AZACTAM) 1 g in sodium chloride 0.9 % 100 mL IVPB     1 g 200 mL/hr over 30 Minutes Intravenous Every 8 hours 12/23/18 1522     12/23/18 1600  vancomycin (VANCOCIN) 1,750 mg in sodium chloride 0.9 % 500 mL IVPB     1,750 mg 250 mL/hr over 120 Minutes Intravenous  Once 12/23/18 1517 12/23/18 1859   12/23/18 1530  aztreonam (AZACTAM) 2 g in sodium chloride 0.9 % 100 mL IVPB     2 g 200 mL/hr over 30 Minutes Intravenous  Once 12/23/18 1522 12/23/18 2015   12/22/18 1200  clindamycin (CLEOCIN) IVPB 300 mg     300 mg 100 mL/hr over 30 Minutes Intravenous  Once 12/22/18 1146 12/22/18 1707   12/22/18 1133  clindamycin (CLEOCIN) 300 MG/50ML IVPB    Note to Pharmacy: Jorja Loa   : cabinet override      12/22/18 1133 12/22/18 1145   12/21/18 2215  clindamycin (CLEOCIN) IVPB 300 mg     300 mg 100 mL/hr over 30 Minutes  Intravenous  Once 12/21/18 2209 12/22/18 0022   12/21/18 1500  levofloxacin (LEVAQUIN) IVPB 750 mg  Status:  Discontinued     750 mg 100 mL/hr over  90 Minutes Intravenous Daily-1800 12/21/18 1338 12/22/18 1114   12/20/18 2215  clindamycin (CLEOCIN) IVPB 300 mg     300 mg 100 mL/hr over 30 Minutes Intravenous  Once 12/20/18 2207 12/21/18 0850   12/18/18 1600  clindamycin (CLEOCIN) IVPB 300 mg     300 mg 100 mL/hr over 30 Minutes Intravenous  Once 12/18/18 1548 12/18/18 1735   12/18/18 0100  clindamycin (CLEOCIN) IVPB 300 mg     300 mg 100 mL/hr over 30 Minutes Intravenous  Once 12/18/18 0058 12/18/18 0922   12/15/18 2000  ciprofloxacin (CIPRO) IVPB 400 mg  Status:  Discontinued     400 mg 200 mL/hr over 60 Minutes Intravenous Every 12 hours 12/15/18 1811 12/18/18 1138   12/15/18 1800  metroNIDAZOLE (FLAGYL) IVPB 500 mg  Status:  Discontinued     500 mg 100 mL/hr over 60 Minutes Intravenous Every 8 hours 12/15/18 1751 12/18/18 1138      Assessment:  63 y/o F with PAD and diabetes on warfarin for history of DVT s/p multiple endovascular interventions. Patient was off warfarin for 2 weeks prior to admission for recent GI bleed. Last dose of warfarin 9/7 with difficulty reaching goal INR of 2-3. INR was sub-therapeutic at 1.4 this morning. Plan for right BKA 9/9 due to ischemic foot. Patient will be transitioned to heparin pending surgical intervention. 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 Heparin drip to be stopped at 7am on 9/9 per vascular.  Patient with unspecified anemia possibly due to blood loss. Patient is s/p 4 units PRBC this admission with last transfusion of 2 units on 9/6. Hgb was 7 this morning and PLT WNL.    Goal of Therapy:  Heparin level 0.3-0.7 units/ml Monitor platelets by anticoagulation protocol: Yes   Plan:  Start heparin drip at 1100 units/hr, no bolus. 6-hr heparin level ordered.  Will follow daily CBC. Ensure heparin drip is stopped 9/9 at 7am prior to surgery.   [place] Resident 12/26/2018 3:22 PM

## 2018-12-26 NOTE — Progress Notes (Signed)
[name]  ROUNDING NOTE   Subjective:   Patient is tearful and upset about learning about scheduled right below the knee amputation.   Scheduled for blood transfusion.   UOP 4125mL.  Creatinine 1.29 (1.62)  NS at 3475mL/hr  Objective:  Vital signs in last 24 hours:  Temp:  [98.5 F (36.9 C)-100.5 F (38.1 C)] 99.6 F (37.6 C) (09/08 0800) Pulse Rate:  [85-116] 104 (09/08 1100) Resp:  [13-29] 15 (09/08 1100) BP: (136-190)/(58-92) 179/72 (09/08 1100) SpO2:  [95 %-100 %] 100 % (09/08 1100) FiO2 (%):  [28 %] 28 % (09/08 0357)  Weight change:  Filed Weights   12/18/18 0414 12/19/18 0409 12/21/18 0453  Weight: 67.4 kg 70.4 kg 69.3 kg    Intake/Output: I/O last 3 completed shifts: In: 1024.3 [I.V.:670.3; Blood:354] Out: 6226 [Urine:6225; Stool:1]   Intake/Output this shift:  Total I/O In: 2338.7 [I.V.:1899.6; IV Piggyback:439.1] Out: 400 [Urine:400]  Physical Exam: General: No acute distress, laying in bed  Head: Normocephalic, atraumatic. Moist oral mucosal membranes  Eyes: Anicteric  Neck: Supple, trachea midline  Lungs:  Clear to auscultation, normal effort  Heart: regular  Abdomen:  Soft, nontender, bowel sounds present  Extremities: Right lower extremity cyanosis, bilateral lower extremity edema  Neurologic: Awake, alert and oriented x4.   Skin: No lesions       Basic Metabolic Panel: Recent Labs  Lab 12/20/18 0320  12/22/18 0625 12/23/18 0344 12/24/18 0732 12/25/18 0806 12/26/18 0328  NA 141   < > 141 141 141 141 139  K 3.7   < > 3.5 4.5 4.4 4.4 4.0  CL 109   < > 114* 112* 114* 111 111  CO2 24   < > 19* 19* 18* 20* 21*  GLUCOSE 120*   < > 192* 178* 117* 104* 116*  BUN 11   < > 18 36* 35* 28* 23  CREATININE 0.73   < > 1.38* 2.20* 2.09* 1.62* 1.29*  CALCIUM 8.8*   < > 7.3* 7.9* 8.1* 8.6* 8.4*  MG 1.7  --   --   --   --  2.0  --   PHOS  --   --   --   --   --  4.1  --    < > = values in this interval not displayed.    Liver  Function Tests: Recent Labs  Lab 12/25/18 0806  AST 117*  ALT 56*  ALKPHOS 104  BILITOT 0.8  PROT 6.6  ALBUMIN 2.6*   No results for input(s): LIPASE, AMYLASE in the last 168 hours. No results for input(s): AMMONIA in the last 168 hours.  CBC: Recent Labs  Lab 12/22/18 2033 12/23/18 0344 12/24/18 0732 12/24/18 1912 12/25/18 0244 12/25/18 0806 12/26/18 0328  WBC 16.5* 13.7* 19.2* 20.9*  --  21.1* 16.5*  NEUTROABS 13.4* 11.0*  --   --   --   --  12.4*  HGB 6.9* 7.6* 5.9* 7.2* 7.3* 8.5* 7.0*  HCT 20.7* 22.8* 18.0* 20.9* 21.0* 25.4* 20.8*  MCV 85.2 85.4 86.5 85.7  --  87.6 86.0  PLT 196 180 240 258  --  331 352    Cardiac Enzymes: Recent Labs  Lab 12/23/18 1802  CKTOTAL 2,617*    BNP: Invalid input(s): POCBNP  CBG: Recent Labs  Lab 12/25/18 1218 12/25/18 1641 12/25/18 2133 12/26/18 0743 12/26/18 1146  GLUCAP 122* 125* 122* 111* 146*    Microbiology: Results for orders placed or performed during the hospital encounter of  12/14/18  SARS Coronavirus 2 [facility] order, Performed in [facility] hospital lab) Nasopharyngeal Nasopharyngeal Swab     Status: None   Collection Time: 12/14/18  3:11 PM   Specimen: Nasopharyngeal Swab  Result Value Ref Range Status   SARS Coronavirus 2 NEGATIVE NEGATIVE Final    Comment: (NOTE) If result is NEGATIVE SARS-CoV-2 target nucleic acids are NOT DETECTED. The SARS-CoV-2 RNA is generally detectable in upper and lower  respiratory specimens during the acute phase of infection. The lowest  concentration of SARS-CoV-2 viral copies this assay can detect is 250  copies / mL. A negative result does not preclude SARS-CoV-2 infection  and should not be used as the sole basis for treatment or other  patient management decisions.  A negative result may occur with  improper specimen collection / handling, submission of specimen other  than nasopharyngeal swab, presence of viral mutation(s) within the  areas targeted by this assay,  and inadequate number of viral copies  (<250 copies / mL). A negative result must be combined with clinical  observations, patient history, and epidemiological information. If result is POSITIVE SARS-CoV-2 target nucleic acids are DETECTED. The SARS-CoV-2 RNA is generally detectable in upper and lower  respiratory specimens dur ing the acute phase of infection.  Positive  results are indicative of active infection with SARS-CoV-2.  Clinical  correlation with patient history and other diagnostic information is  necessary to determine patient infection status.  Positive results do  not rule out bacterial infection or co-infection with other viruses. If result is PRESUMPTIVE POSTIVE SARS-CoV-2 nucleic acids MAY BE PRESENT.   A presumptive positive result was obtained on the submitted specimen  and confirmed on repeat testing.  While 2019 novel coronavirus  (SARS-CoV-2) nucleic acids may be present in the submitted sample  additional confirmatory testing may be necessary for epidemiological  and / or clinical management purposes  to differentiate between  SARS-CoV-2 and other Sarbecovirus currently known to infect humans.  If clinically indicated additional testing with an alternate test  methodology [phone]) is advised. The SARS-CoV-2 RNA is generally  detectable in upper and lower respiratory sp ecimens during the acute  phase of infection. The expected result is Negative. Fact Sheet for Patients:  StrictlyIdeas.no Fact Sheet for Healthcare Providers: BankingDealers.co.za This test is not yet approved or cleared by the [place] FDA and has been authorized for detection and/or diagnosis of SARS-CoV-2 by FDA under an Emergency Use Authorization (EUA).  This EUA will remain in effect (meaning this test can be used) for the duration of the COVID-19 declaration under Section 564(b)(1) of the Act, 21 U.S.C. section 360bbb-3(b)(1), unless  the authorization is terminated or revoked sooner. Performed at [facility], [address] [place], [place] [address]   CULTURE, BLOOD (ROUTINE X 2) w Reflex to ID Panel     Status: None (Preliminary result)   Collection Time: 12/23/18  3:22 PM   Specimen: BLOOD  Result Value Ref Range Status   Specimen Description BLOOD BLOOD LEFT HAND  Final   Special Requests   Final    BOTTLES DRAWN AEROBIC AND ANAEROBIC Blood Culture adequate volume   Culture   Final    NO GROWTH 3 DAYS Performed at [facility], [address] [place],  [address]    Report Status PENDING  Incomplete  CULTURE, BLOOD (ROUTINE X 2) w Reflex to ID Panel     Status: None (Preliminary result)   Collection Time: 12/23/18  5:05 PM  Specimen: BLOOD  Result Value Ref Range Status   Specimen Description BLOOD RT HAND  Final   Special Requests   Final    BOTTLES DRAWN AEROBIC AND ANAEROBIC Blood Culture adequate volume   Culture   Final    NO GROWTH 3 DAYS Performed at [facility], [address] [place], [place] [address]    Report Status PENDING  Incomplete  MRSA PCR Screening     Status: None   Collection Time: 12/23/18  5:19 PM   Specimen: Nasal Mucosa; Nasopharyngeal  Result Value Ref Range Status   MRSA by PCR NEGATIVE NEGATIVE Final    Comment:        The GeneXpert MRSA Assay (FDA approved for NASAL specimens only), is one component of a comprehensive MRSA colonization surveillance program. It is not intended to diagnose MRSA infection nor to guide or monitor treatment for MRSA infections. Performed at [facility], [address] [place], [place] [address]     Coagulation Studies: Recent Labs    12/24/18 0435 12/25/18 0244 12/26/18 0328  LABPROT 19.6* 17.1* 17.2*  INR 1.7* 1.4* 1.4*    Urinalysis: No results for input(s): COLORURINE, LABSPEC, PHURINE, GLUCOSEU, HGBUR, BILIRUBINUR, KETONESUR, PROTEINUR, UROBILINOGEN, NITRITE,  LEUKOCYTESUR in the last 72 hours.  Invalid input(s): APPERANCEUR    Imaging: US Arterial Lower Extremity Duplex Right(non-abi)  Result Date: 12/26/2018 CLINICAL DATA:  Right leg pain EXAM: RIGHT LOWER EXTREMITY ARTERIAL DUPLEX SCAN TECHNIQUE: Gray-scale sonography as well as color Doppler and duplex ultrasound was performed to evaluate the lower extremity arteries including the common, superficial and profunda femoral arteries, popliteal artery and calf arteries. COMPARISON:  None. FINDINGS: Right lower Extremity ABI: Not calculated Inflow: Mild plaque through the common femoral artery. Multiphasic waveform. Outflow: Stent from the proximal SFA which is occluded. Occlusion extends through the popliteal artery. Runoff: No flow signal in posterior tibial or peroneal arteries. Reconstituted monophasic slow flow in the anterior tibial artery. IMPRESSION: 1. Long segment right SFA and popliteal arterial occlusion with reconstitution of anterior tibial flow. Electronically Signed   By: Corlis Leak M.D.   On: 12/26/2018 08:45     Medications:   . sodium chloride Stopped (12/26/18 0859)  . aztreonam 1 g (12/26/18 0900)   . sodium chloride   Intravenous Once  . sodium chloride   Intravenous Once  . acetaminophen  650 mg Oral Once  . atorvastatin  10 mg Oral Daily  . Chlorhexidine Gluconate Cloth  6 each Topical Daily  . diphenhydrAMINE  25 mg Intravenous Once  . docusate sodium  100 mg Oral BID  . furosemide  20 mg Intravenous Once  . HYDROmorphone   Intravenous Q4H  . insulin aspart  0-5 Units Subcutaneous QHS  . insulin aspart  0-9 Units Subcutaneous TID WC  . iron polysaccharides  150 mg Oral Daily  . linagliptin  5 mg Oral Daily  . metoCLOPramide  5 mg Oral TID AC  . multivitamin with minerals  1 tablet Oral Daily  . pantoprazole  40 mg Oral Daily  . polyethylene glycol  17 g Oral Daily  . senna  2 tablet Oral Daily  . sodium chloride flush  10-40 mL Intracatheter Q12H  . sodium  chloride flush  3 mL Intravenous Q12H   acetaminophen, ALPRAZolam, bisacodyl, diphenhydrAMINE **OR** diphenhydrAMINE, hydrALAZINE, naloxone **AND** sodium chloride flush, ondansetron (ZOFRAN) IV, ondansetron (ZOFRAN) IV, oxyCODONE, oxyCODONE, sodium chloride flush  Assessment/ Plan:   Ms. Sylvia [name] is a 63 y.o. black  female with diabetes mellitus type II, hypertension, peripheral vascular disease, COPD, GERD, hyperlipidemia, who is admitted to 12/14/2018 for DKA. Patient has had a complicated hospital course. Patient underwent angiogram, thrombectomy, and stent placement on 9/1. Proceded to have acute kidney injury with creatinine peaking at 2.2.   1.  Acute renal failure with metabolic acidosis secondary to contrast-induced nephropathy given multiple angiograms.  Also with rhabdomyolysis, CK peaked at 2617.  Nonoliguric urine output.  Baseline creatinine of 0.71. 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  - NS infusion at 5575mL/hr   2. Diabetes mellitus type II with renal manifestations of proteinuria and glycosuria. Admitted with DKA.  Noninsulin dependent.  Hemoglobin A1c of 7.9%. Not well controlled.   3. Hypertension: elevated this morning due to pain. Currently not on any blood pressure agents.   4. Anemia with renal failure: hemoglobin down to 7. PRBC transfusion scheduled for today.  PRBC transfusion on 9/3 and 9/6.    LOS: 12 Sylvia [name] 9/8/202011:49 AM

## 2018-12-27 ENCOUNTER — Encounter
Admission: EM | Disposition: A | Discharge status: Home Health | Payer: Self-pay | Source: Home / Self Care | Attending: Internal Medicine

## 2018-12-27 ENCOUNTER — Encounter: Payer: Self-pay | Admitting: Anesthesiology

## 2018-12-27 ENCOUNTER — Inpatient Hospital Stay: Payer: Medicaid Other | Admitting: Anesthesiology

## 2018-12-27 DIAGNOSIS — I70261 Atherosclerosis of native arteries of extremities with gangrene, right leg: Secondary | ICD-10-CM

## 2018-12-27 HISTORY — PX: AMPUTATION: SHX166

## 2018-12-27 LAB — BASIC METABOLIC PANEL
Anion gap: 10 (ref 5–15)
BUN: 18 mg/dL (ref 8–23)
CO2: 22 mmol/L (ref 22–32)
Calcium: 8.3 mg/dL — ABNORMAL LOW (ref 8.9–10.3)
Chloride: 108 mmol/L (ref 98–111)
Creatinine, Ser: 1.21 mg/dL — ABNORMAL HIGH (ref 0.44–1.00)
GFR calc Af Amer: 55 mL/min — ABNORMAL LOW (ref >60)
GFR calc non Af Amer: 47 mL/min — ABNORMAL LOW (ref >60)
Glucose, Bld: 115 mg/dL — ABNORMAL HIGH (ref 70–99)
Potassium: 3.6 mmol/L (ref 3.5–5.1)
Sodium: 140 mmol/L (ref 135–145)

## 2018-12-27 LAB — PROTIME-INR
INR: 1.8 — ABNORMAL HIGH (ref 0.8–1.2)
Prothrombin Time: 20.6 seconds — ABNORMAL HIGH (ref 11.4–15.2)

## 2018-12-27 LAB — CBC
HCT: 23.7 % — ABNORMAL LOW (ref 36.0–46.0)
Hemoglobin: 8.1 g/dL — ABNORMAL LOW (ref 12.0–15.0)
MCH: 28.9 pg (ref 26.0–34.0)
MCHC: 34.2 g/dL (ref 30.0–36.0)
MCV: 84.6 fL (ref 80.0–100.0)
Platelets: 389 10*3/uL (ref 150–400)
RBC: 2.8 MIL/uL — ABNORMAL LOW (ref 3.87–5.11)
RDW: 14.4 % (ref 11.5–15.5)
WBC: 17.5 10*3/uL — ABNORMAL HIGH (ref 4.0–10.5)
nRBC: 0 % (ref 0.0–0.2)

## 2018-12-27 LAB — GLUCOSE, CAPILLARY
Glucose-Capillary: 92 mg/dL (ref 70–99)
Glucose-Capillary: 76 mg/dL (ref 70–99)
Glucose-Capillary: 93 mg/dL (ref 70–99)
Glucose-Capillary: 261 mg/dL — ABNORMAL HIGH (ref 70–99)
Glucose-Capillary: 249 mg/dL — ABNORMAL HIGH (ref 70–99)

## 2018-12-27 LAB — MAGNESIUM: Magnesium: 1.5 mg/dL — ABNORMAL LOW (ref 1.7–2.4)

## 2018-12-27 SURGERY — AMPUTATION BELOW KNEE
Anesthesia: General | Site: Knee | Laterality: Right

## 2018-12-27 MED ORDER — SUCCINYLCHOLINE CHLORIDE 20 MG/ML IJ SOLN
INTRAMUSCULAR | Status: AC
  Filled 2018-12-27: qty 1

## 2018-12-27 MED ORDER — FENTANYL CITRATE (PF) 100 MCG/2ML IJ SOLN
INTRAMUSCULAR | Status: AC
  Filled 2018-12-27: qty 2

## 2018-12-27 MED ORDER — PROPOFOL 10 MG/ML IV BOLUS
INTRAVENOUS | Status: AC
  Filled 2018-12-27: qty 20

## 2018-12-27 MED ORDER — METOPROLOL TARTRATE 5 MG/5ML IV SOLN
INTRAVENOUS | Status: AC
  Filled 2018-12-27: qty 5

## 2018-12-27 MED ORDER — SEVOFLURANE IN SOLN
RESPIRATORY_TRACT | Status: AC
  Filled 2018-12-27: qty 250

## 2018-12-27 MED ORDER — SUGAMMADEX SODIUM 200 MG/2ML IV SOLN
INTRAVENOUS | Status: DC | PRN
  Administered 2018-12-27: 140 mg via INTRAVENOUS

## 2018-12-27 MED ORDER — SODIUM CHLORIDE 0.9 % IV SOLN
INTRAVENOUS | Status: DC | PRN
  Administered 2018-12-27: 13:00:00 via INTRAVENOUS

## 2018-12-27 MED ORDER — PHENYLEPHRINE HCL (PRESSORS) 10 MG/ML IV SOLN
INTRAVENOUS | Status: DC | PRN
  Administered 2018-12-27 (×2): 100 ug via INTRAVENOUS
  Administered 2018-12-27: 50 ug via INTRAVENOUS

## 2018-12-27 MED ORDER — CLINDAMYCIN PHOSPHATE 600 MG/50ML IV SOLN
INTRAVENOUS | Status: DC | PRN
  Administered 2018-12-27: 600 mg via INTRAVENOUS

## 2018-12-27 MED ORDER — DEXAMETHASONE SODIUM PHOSPHATE 10 MG/ML IJ SOLN
INTRAMUSCULAR | Status: AC
  Filled 2018-12-27: qty 1

## 2018-12-27 MED ORDER — PROPOFOL 10 MG/ML IV BOLUS: INTRAVENOUS | Status: DC | PRN

## 2018-12-27 MED ORDER — HEPARIN (PORCINE) 25000 UT/250ML-% IV SOLN: 1250.0000 [IU]/h | INTRAVENOUS | Status: AC

## 2018-12-27 MED ORDER — FENTANYL CITRATE (PF) 100 MCG/2ML IJ SOLN
25.0000 ug | INTRAMUSCULAR | Status: DC | PRN
  Administered 2018-12-27 (×2): 25 ug via INTRAVENOUS

## 2018-12-27 MED ORDER — CLINDAMYCIN PHOSPHATE 600 MG/50ML IV SOLN
INTRAVENOUS | Status: AC
  Filled 2018-12-27: qty 50

## 2018-12-27 MED ORDER — ACETAMINOPHEN 10 MG/ML IV SOLN
INTRAVENOUS | Status: AC
  Filled 2018-12-27: qty 100

## 2018-12-27 MED ORDER — METOPROLOL TARTRATE 5 MG/5ML IV SOLN
INTRAVENOUS | Status: DC | PRN
  Administered 2018-12-27: 1 mg via INTRAVENOUS

## 2018-12-27 MED ORDER — SUCCINYLCHOLINE CHLORIDE 20 MG/ML IJ SOLN
INTRAMUSCULAR | Status: DC | PRN
  Administered 2018-12-27: 100 mg via INTRAVENOUS

## 2018-12-27 MED ORDER — MAGNESIUM SULFATE 4 GM/100ML IV SOLN
4.0000 g | Freq: Once | INTRAVENOUS | Status: AC
  Administered 2018-12-27: 4 g via INTRAVENOUS
  Filled 2018-12-27: qty 100

## 2018-12-27 MED ORDER — ROCURONIUM BROMIDE 50 MG/5ML IV SOLN
INTRAVENOUS | Status: AC
  Filled 2018-12-27: qty 1

## 2018-12-27 MED ORDER — DEXMEDETOMIDINE HCL 200 MCG/2ML IV SOLN
INTRAVENOUS | Status: DC | PRN
  Administered 2018-12-27 (×2): 4 ug via INTRAVENOUS

## 2018-12-27 MED ORDER — SUGAMMADEX SODIUM 200 MG/2ML IV SOLN
INTRAVENOUS | Status: AC
  Filled 2018-12-27: qty 2

## 2018-12-27 MED ORDER — ACETAMINOPHEN 10 MG/ML IV SOLN
INTRAVENOUS | Status: DC | PRN
  Administered 2018-12-27: 1000 mg via INTRAVENOUS

## 2018-12-27 MED ORDER — ONDANSETRON HCL 4 MG/2ML IJ SOLN
INTRAMUSCULAR | Status: DC | PRN
  Administered 2018-12-27: 4 mg via INTRAVENOUS

## 2018-12-27 MED ORDER — FENTANYL CITRATE (PF) 100 MCG/2ML IJ SOLN
INTRAMUSCULAR | Status: DC | PRN
  Administered 2018-12-27 (×4): 50 ug via INTRAVENOUS

## 2018-12-27 MED ORDER — PROPOFOL 10 MG/ML IV BOLUS
INTRAVENOUS | Status: DC | PRN
  Administered 2018-12-27: 130 mg via INTRAVENOUS

## 2018-12-27 MED ORDER — ONDANSETRON HCL 4 MG/2ML IJ SOLN
INTRAMUSCULAR | Status: AC
  Filled 2018-12-27: qty 2

## 2018-12-27 MED ORDER — ROCURONIUM BROMIDE 100 MG/10ML IV SOLN
INTRAVENOUS | Status: DC | PRN
  Administered 2018-12-27: 5 mg via INTRAVENOUS
  Administered 2018-12-27: 25 mg via INTRAVENOUS

## 2018-12-27 MED ORDER — FENTANYL CITRATE (PF) 250 MCG/5ML IJ SOLN
INTRAMUSCULAR | Status: AC
  Filled 2018-12-27: qty 5

## 2018-12-27 MED ORDER — ONDANSETRON HCL 4 MG/2ML IJ SOLN: 4.0000 mg | Freq: Once | INTRAMUSCULAR | Status: DC | PRN

## 2018-12-27 MED ORDER — DEXAMETHASONE SODIUM PHOSPHATE 10 MG/ML IJ SOLN
INTRAMUSCULAR | Status: DC | PRN
  Administered 2018-12-27: 5 mg via INTRAVENOUS

## 2018-12-27 MED ORDER — MIDAZOLAM HCL 5 MG/5ML IJ SOLN
INTRAMUSCULAR | Status: DC | PRN
  Administered 2018-12-27: 2 mg via INTRAVENOUS

## 2018-12-27 MED ORDER — MIDAZOLAM HCL 2 MG/2ML IJ SOLN
INTRAMUSCULAR | Status: AC
  Filled 2018-12-27: qty 2

## 2018-12-27 SURGICAL SUPPLY — 39 items
BANDAGE ELASTIC 6 LF NS (GAUZE/BANDAGES/DRESSINGS) ×2 IMPLANT
BLADE SAGITTAL WIDE XTHICK NO (BLADE) ×1 IMPLANT
BLADE SAW SAG 25.4X90 (BLADE) ×2 IMPLANT
BNDG COHESIVE 4X5 TAN STRL (GAUZE/BANDAGES/DRESSINGS) ×2 IMPLANT
BNDG GAUZE 4.5X4.1 6PLY STRL (MISCELLANEOUS) ×4 IMPLANT
BRUSH SCRUB EZ  4% CHG (MISCELLANEOUS)
BRUSH SCRUB EZ 4% CHG (MISCELLANEOUS) ×1 IMPLANT
CANISTER SUCT 1200ML W/VALVE (MISCELLANEOUS) ×2 IMPLANT
CHLORAPREP W/TINT 26ML (MISCELLANEOUS) ×2 IMPLANT
COVER WAND RF STERILE (DRAPES) ×2 IMPLANT
DRAIN PENROSE 1/4X12 LTX STRL (WOUND CARE) IMPLANT
DRAPE INCISE IOBAN 66X45 STRL (DRAPES) ×1 IMPLANT
ELECT CAUTERY BLADE 6.4 (BLADE) ×2 IMPLANT
ELECT REM PT RETURN 9FT ADLT (ELECTROSURGICAL) ×2
ELECTRODE REM PT RTRN 9FT ADLT (ELECTROSURGICAL) ×1 IMPLANT
GAUZE XEROFORM 1X8 LF (GAUZE/BANDAGES/DRESSINGS) ×2 IMPLANT
GLOVE BIO SURGEON STRL SZ7 (GLOVE) ×4 IMPLANT
GLOVE INDICATOR 7.5 STRL GRN (GLOVE) ×2 IMPLANT
GOWN STRL REUS W/ TWL LRG LVL3 (GOWN DISPOSABLE) ×2 IMPLANT
GOWN STRL REUS W/ TWL XL LVL3 (GOWN DISPOSABLE) ×1 IMPLANT
GOWN STRL REUS W/TWL LRG LVL3 (GOWN DISPOSABLE) ×4
GOWN STRL REUS W/TWL XL LVL3 (GOWN DISPOSABLE) ×2
HANDLE YANKAUER SUCT BULB TIP (MISCELLANEOUS) ×2 IMPLANT
KIT TURNOVER KIT A (KITS) ×2 IMPLANT
LABEL OR SOLS (LABEL) ×2 IMPLANT
NS IRRIG 1000ML POUR BTL (IV SOLUTION) ×2 IMPLANT
PACK EXTREMITY ARMC (MISCELLANEOUS) ×2 IMPLANT
PAD ABD DERMACEA PRESS 5X9 (GAUZE/BANDAGES/DRESSINGS) ×4 IMPLANT
PAD PREP 24X41 OB/GYN DISP (PERSONAL CARE ITEMS) ×2 IMPLANT
SPONGE LAP 18X18 RF (DISPOSABLE) ×4 IMPLANT
STAPLER SKIN PROX 35W (STAPLE) ×2 IMPLANT
STOCKINETTE M/LG 89821 (MISCELLANEOUS) ×2 IMPLANT
SUT SILK 2 0 (SUTURE) ×2
SUT SILK 2 0 SH (SUTURE) ×4 IMPLANT
SUT SILK 2-0 18XBRD TIE 12 (SUTURE) ×1 IMPLANT
SUT SILK 3 0 (SUTURE) ×2
SUT SILK 3-0 18XBRD TIE 12 (SUTURE) ×1 IMPLANT
SUT VIC AB 0 CT1 36 (SUTURE) ×4 IMPLANT
SUT VIC AB 2-0 CT1 (SUTURE) ×4 IMPLANT

## 2018-12-27 NOTE — Anesthesia Post-op Follow-up Note (Signed)
Anesthesia QCDR form completed.        

## 2018-12-27 NOTE — Transfer of Care (Signed)
Immediate Anesthesia Transfer of Care Note  Patient: Sylvia [name]  Procedure(s) Performed: AMPUTATION BELOW KNEE (Right Knee)  Patient Location: PACU  Anesthesia Type:General  Level of Consciousness: sedated  Airway & Oxygen Therapy: Patient Spontanous Breathing and Patient connected to face mask oxygen  Post-op Assessment: Report given to RN and Post -op Vital signs reviewed and stable  Post vital signs: Reviewed and stable  Last Vitals:  Vitals Value Taken Time  BP 124/62 12/27/18 1502  Temp 97.51F   Pulse 103   Resp 25 12/27/18 1506  SpO2 100%   Vitals shown include unvalidated device data.  Last Pain:  Vitals:   12/27/18 1329  TempSrc:   PainSc: 4       Patients Stated Pain Goal: 0 (94/76/54 6503)  Complications: No apparent anesthesia complications

## 2018-12-27 NOTE — Progress Notes (Signed)
[place] visited w/ pt as a f/u. Pt seemed to have a mild affect upon ch entry. Ch discussed w/ pt her decision to move forward w/ the amputation of her foot. Pt was in a place of acceptance. Ch provided words of encouragement to pt and informed pt of the possibility of her experiencing phantom limb after the surgery. Pt was appreciative of the f/u visit from ch.   F/U care should include post-op spiritual support and update on pt's status from care team.    12/27/18 1200  Clinical Encounter Type  Visited With Patient  Visit Type Spiritual support;Social support;Pre-op;Critical Care  Spiritual Encounters  Spiritual Needs Emotional;Grief support  Stress Factors  Patient Stress Factors Health changes;Loss;Loss of control;Major life changes

## 2018-12-27 NOTE — Anesthesia Procedure Notes (Signed)
Procedure Name: Intubation Date/Time: 12/27/2018 1:43 PM Performed by: Dionne Bucy, CRNA Pre-anesthesia Checklist: Patient identified, Patient being monitored, Timeout performed, Emergency Drugs available and Suction available Patient Re-evaluated:Patient Re-evaluated prior to induction Oxygen Delivery Method: Circle system utilized Preoxygenation: Pre-oxygenation with 100% oxygen Induction Type: IV induction Ventilation: Mask ventilation without difficulty Laryngoscope Size: 3 and McGraph Grade View: Grade I Tube type: Oral Tube size: 7.0 mm Number of attempts: 1 Airway Equipment and Method: Stylet and Video-laryngoscopy Placement Confirmation: ETT inserted through vocal cords under direct vision,  positive ETCO2 and breath sounds checked- equal and bilateral Secured at: 21 cm Tube secured with: Tape Dental Injury: Teeth and Oropharynx as per pre-operative assessment

## 2018-12-27 NOTE — H&P (Signed)
[facility] History & Physical Update  The patient was interviewed and re-examined.  The patient's previous History and Physical has been reviewed and is unchanged.  There is no change in the plan of care. We plan to proceed with the scheduled procedure of right BKA.  Leotis Pain, MD  12/27/2018, 12:47 PM

## 2018-12-27 NOTE — Plan of Care (Signed)
POC reviewed with patient and daughter 

## 2018-12-27 NOTE — Progress Notes (Signed)
Patient arrived back on unit from OR at this time. VSS.

## 2018-12-27 NOTE — Progress Notes (Signed)
Pt's BP is 198/64, 10 mg hydralazine given per PRN order

## 2018-12-27 NOTE — Progress Notes (Signed)
[facility] [place] [facility]   PATIENT NAME: Sylvia [name]    MR#:  [id number]  DATE OF BIRTH:  February 27, 1956  Patient received 2 units of packed RBC transfusion, hemoglobin today is more than 8, no fever, IV antibiotics are using, patient complains of severe right leg pain, right foot is swollen than before, black discoloration of toes on the right side.  Patient is leaning forward for right foot amputation  CHIEF COMPLAINT:   Chief Complaint  Patient presents with  . Abdominal Pain  Continues to have pain in the leg REVIEW OF SYSTEMS:  CONSTITUTIONAL: No fever, fatigue or weakness.  EYES: No blurred or double vision.  EARS, NOSE, AND THROAT: No tinnitus or ear pain.  RESPIRATORY: No cough, shortness of breath, wheezing or hemoptysis.  CARDIOVASCULAR: No chest pain, orthopnea, edema.  GASTROINTESTINAL: No nausea or vomiting.  Has constipation.  GENITOURINARY: No dysuria, hematuria.  ENDOCRINE: No polyuria, nocturia,  HEMATOLOGY: Anemia present  sKIN:  severe right leg pain  pSYCHIATRY: No anxiety or depression.  Right leg pain, swelling, cold to touch.   DRUG ALLERGIES:   Allergies  Allergen Reactions  . Penicillins Hives    Has patient had a PCN reaction causing immediate rash, facial/tongue/throat swelling, SOB or lightheadedness with hypotension: Yes Has patient had a PCN reaction causing severe rash involving mucus membranes or skin necrosis: No Has patient had a PCN reaction that required hospitalization: No Has patient had a PCN reaction occurring within the last 10 years: No If all of the above answers are "NO", then may proceed with Cephalosporin use.  . Tramadol Itching    VITALS:  Blood pressure (!) 149/75, pulse 98, temperature 99 F (37.2 C), temperature source Oral, resp. rate 16, height 5\' 7"  (1.702 m), weight 69.3 kg, SpO2 100 %.  PHYSICAL EXAMINATION:  GENERAL:  63 y.o.-year-old patient lying in the bed with no acute distress.   EYES: Pupils equal, round, reactive to light and accommodation. No scleral icterus. Extraocular muscles intact.  HEENT: Head atraumatic, normocephalic. Oropharynx and nasopharynx clear.  NECK:  Supple, no jugular venous distention. No thyroid enlargement, no tenderness.  LUNGS: Clear to auscultation with occasional basilar crepitations present CARDIOVASCULAR: S1, S2 tachycardic l. No murmurs, rubs, or gallops.  ABDOMEN: Soft, nontender, nondistended. Bowel sounds present. No organomegaly or mass.  EXTREMITIES: Patient has gangrenous changes on right foot toes, right foot cool to touch  No obvious hematoma, left foot viable, right foot cold to touch, no palpable pulses. nEUROLOGIC: Cranial nerves II through XII are intact. Muscle strength 4/5 in all extremities. Sensation intact. Gait not checked.  PSYCHIATRIC: The patient is alert and oriented x 3.  SKIN: Right leg pain, swelling, discoloration of the right great toe, fourth toe LABORATORY PANEL:   CBC Recent Labs  Lab 12/27/18 0535  WBC 17.5*  HGB 8.1*  HCT 23.7*  PLT 389   ------------------------------------------------------------------------------------------------------------------  Chemistries  Recent Labs  Lab 12/25/18 0806  12/27/18 0535  NA 141   < > 140  K 4.4   < > 3.6  CL 111   < > 108  CO2 20*   < > 22  GLUCOSE 104*   < > 115*  BUN 28*   < > 18  CREATININE 1.62*   < > 1.21*  CALCIUM 8.6*   < > 8.3*  MG 2.0  --  1.5*  AST 117*  --   --   ALT 56*  --   --  ALKPHOS 104  --   --   BILITOT 0.8  --   --    < > = values in this interval not displayed.   ------------------------------------------------------------------------------------------------------------------  Cardiac Enzymes No results for input(s): TROPONINI in the last 168 hours. ------------------------------------------------------------------------------------------------------------------  RADIOLOGY:  US Arterial Lower Extremity Duplex  Right(non-abi)  Result Date: 12/26/2018 CLINICAL DATA:  Right leg pain EXAM: RIGHT LOWER EXTREMITY ARTERIAL DUPLEX SCAN TECHNIQUE: Gray-scale sonography as well as color Doppler and duplex ultrasound was performed to evaluate the lower extremity arteries including the common, superficial and profunda femoral arteries, popliteal artery and calf arteries. COMPARISON:  None. FINDINGS: Right lower Extremity ABI: Not calculated Inflow: Mild plaque through the common femoral artery. Multiphasic waveform. Outflow: Stent from the proximal SFA which is occluded. Occlusion extends through the popliteal artery. Runoff: No flow signal in posterior tibial or peroneal arteries. Reconstituted monophasic slow flow in the anterior tibial artery. IMPRESSION: 1. Long segment right SFA and popliteal arterial occlusion with reconstitution of anterior tibial flow. Electronically Signed   By: Lucrezia Europe M.D.   On: 12/26/2018 08:45    ASSESSMENT AND PLAN:   Active Problems:   DKA (diabetic ketoacidoses) (HCC)  Severe peripheral artery disease related to diabetes status post multiple angiograms to the right leg, has failed all all other treatment Plan for amputation later today   Acute blood loss anemia, status post transfusion Follow CBC  Essential hypertension.  Continue home meds,   uncontrolled diabetes mellitus type 2: With PAD ; Tradjenta, sliding scale insulin with coverage.    Acute kidney injury, likely secondary to contrast nephropathy, appreciate nephrology input     All the records are reviewed and case discussed with Care Management/Social Workerr. Management plans discussed with the patient, family and they are in agreement.  CODE STATUS: Full  TOTAL TIME TAKING CARE OF THIS PATIENT: 35 minutes.  Patient condition critical, high risk for cardiac arrest.   More than 50% time spent in counseling, coordination of care   POSSIBLE D/C IN 1-2 DAYS, DEPENDING ON CLINICAL CONDITION.   Dustin Flock M.D on 12/27/2018   Between 7am to 6pm - Pager - [phone]  After 6pm go to [email or web address] - password [email or web address] [facility]  Office  [phone]  CC: Primary care physician; [place] [place]  Note: This dictation was prepared with Diplomatic Services operational officer dictation along with smaller phrase technology. Any transcriptional errors that result from this process are unintentional.

## 2018-12-27 NOTE — Progress Notes (Signed)
Pt reting quietly in bed, she has her stump elevated with an icepack in place. PCA pump infusing, hydralazine effective, call light within reach

## 2018-12-27 NOTE — Op Note (Signed)
OPERATIVE NOTE   PROCEDURE: Right below-the-knee amputation  PRE-OPERATIVE DIAGNOSIS: Right foot gangrene  POST-OPERATIVE DIAGNOSIS: same as above  SURGEON: Leotis Pain, MD  ASSISTANT(S): Hezzie Bump, PA-C  ANESTHESIA: general  ESTIMATED BLOOD LOSS: 75 cc  FINDING(S): Muscle necrosis of the soleus and part of the gastrocnemius muscle that was resected back to more viable appearing tissue.  SPECIMEN(S):  Right below-the-knee amputation  INDICATIONS:   Sylvia [name] is a 63 y.o. female who presents with right leg gangrene.  The patient is scheduled for a right below-the-knee amputation.  I discussed in depth with the patient the risks, benefits, and alternatives to this procedure.  The patient is aware that the risk of this operation included but are not limited to:  bleeding, infection, myocardial infarction, stroke, death, failure to heal amputation wound, and possible need for more proximal amputation.  The patient is aware of the risks and agrees proceed forward with the procedure. An assistant was present during the procedure to help facilitate the exposure and expedite the procedure.  DESCRIPTION:  After full informed written consent was obtained from the patient, the patient was brought back to the operating room, and placed supine upon the operating table.  Prior to induction, the patient received IV antibiotics.  The patient was then prepped and draped in the standard fashion for a below-the-knee amputation.  The assistant provided retraction and mobilization to help facilitate exposure and expedite the procedure throughout the entire procedure.  This included following suture, using retractors, and optimizing lighting. After obtaining adequate anesthesia, the patient was prepped and draped in the standard fashion for a right below-the-knee amputation.  I marked out the anterior incision two finger breadths below the tibial tuberosity and then the marked out a posterior  flap that was one third of the circumference of the calf in length.   I made the incisions for these flaps, and then dissected through the subcutaneous tissue, fascia, and muscle anteriorly.  I elevated  the periosteal tissue superiorly so that the tibia was about 3-4 cm shorter than the anterior skin flap.  I then transected the tibia with a power saw and then took a wedge off the tibia anteriorly with the power saw.  Then I smoothed out the rough edges.  In a similar fashion, I cut back the fibula about two centimeters higher than the level of the tibia with a bone cutter.  I put a bone hook into the distal tibia and then used a large amputation knife to sharply develop a tissue plane through the muscle along the fibula.  In such fashion, the posterior flap was developed.  At this point, the specimen was passed off the field as the below-the-knee amputation.  At this point, I clamped all visibly bleeding arteries and veins using a combination of suture ligation with Silk suture and electrocautery.  Bleeding continued to be controlled with electrocautery and suture ligature.  The posterior muscle was pale and somewhat necrotic and a significant portion of the soleus and gastrocnemius muscles were then removed as they were not viable.  There still was a reasonable posterior flap remaining but I did take the bone about a centimeter higher to have less pressure in that area.  This also allows some of the anterior tibial muscle to be pulled down and cover the fibula and the lateral portion of the tibia.  The stump was washed off with sterile normal saline and no further active bleeding was noted.  I reapproximated the anterior and  posterior fascia  with interrupted stitches of 0 Vicryl.  This was completed along the entire length of anterior and posterior fascia until there were no more loose space in the fascial line. I then placed a layer of 2-0 Vicryl sutures in the subcutaneous tissue. The skin was then   reapproximated with staples.  The stump was washed off and dried.  The incision was dressed with Xeroform and  then fluffs were applied.  Kerlix was wrapped around the leg and then gently an ACE wrap was applied.    COMPLICATIONS: none  CONDITION: stable   Sylvia [name]  12/27/2018, 2:47 PM    This note was created with Dragon Medical transcription system. Any errors in dictation are purely unintentional.

## 2018-12-27 NOTE — Anesthesia Preprocedure Evaluation (Signed)
Anesthesia Evaluation  Patient identified by MRN, date of birth, ID band Patient awake    Reviewed: Allergy & Precautions, H&P , NPO status , Patient's Chart, lab work & pertinent test results, reviewed documented beta blocker date and time   Airway Mallampati: II   Neck ROM: full    Dental  (+) Poor Dentition   Pulmonary COPD, Current Smoker, former smoker,    Pulmonary exam normal        Cardiovascular Exercise Tolerance: Poor hypertension, On Medications + Peripheral Vascular Disease  Normal cardiovascular exam Rhythm:regular Rate:Normal     Neuro/Psych Anxiety negative neurological ROS  negative psych ROS   GI/Hepatic Neg liver ROS, GERD  ,  Endo/Other  negative endocrine ROSdiabetes, Well Controlled  Renal/GU negative Renal ROS  negative genitourinary   Musculoskeletal   Abdominal   Peds  Hematology  (+) anemia ,   Anesthesia Other Findings Past Medical History: No date: Diabetes mellitus without complication ([place]) No date: Hypertension Past Surgical History: No date: ABDOMINAL HYSTERECTOMY No date: CHOLECYSTECTOMY BMI    Body Mass Index:  22.84 kg/m     Reproductive/Obstetrics negative OB ROS                             Anesthesia Physical  Anesthesia Plan  ASA: III  Anesthesia Plan: General   Post-op Pain Management:    Induction:   PONV Risk Score and Plan:   Airway Management Planned: Oral ETT  Additional Equipment:   Intra-op Plan:   Post-operative Plan: Extubation in OR  Informed Consent: I have reviewed the patients History and Physical, chart, labs and discussed the procedure including the risks, benefits and alternatives for the proposed anesthesia with the patient or authorized representative who has indicated his/her understanding and acceptance.     Dental Advisory Given  Plan Discussed with: CRNA  Anesthesia Plan Comments:          Anesthesia Quick Evaluation

## 2018-12-27 NOTE — Consult Note (Signed)
ANTICOAGULATION CONSULT NOTE - Initial Consult  Pharmacy Consult for Heparin Indication: Hx of DVT and s/p multiple endovascular interventions.   Allergies  Allergen Reactions  . Penicillins Hives    Has patient had a PCN reaction causing immediate rash, facial/tongue/throat swelling, SOB or lightheadedness with hypotension: Yes Has patient had a PCN reaction causing severe rash involving mucus membranes or skin necrosis: No Has patient had a PCN reaction that required hospitalization: No Has patient had a PCN reaction occurring within the last 10 years: No If all of the above answers are "NO", then may proceed with Cephalosporin use.  . Tramadol Itching    Patient Measurements: Height: 5\' 7"  (170.2 cm) Weight: 152 lb 12.8 oz (69.3 kg) IBW/kg (Calculated) : 61.6  Heparin dosing weight:  69.3 kg  Vital Signs: Temp: 99.2 F (37.3 C) (09/08 2100) Temp Source: Oral (09/08 2100) BP: 172/79 (09/08 2200) Pulse Rate: 95 (09/09 0100)  Labs: Recent Labs    12/24/18 0435 12/24/18 0732  12/25/18 0244 12/25/18 0806 12/26/18 0328 12/26/18 1809 12/26/18 2222  HGB  --  5.9*   < > 7.3* 8.5* 7.0* 8.5*  --   HCT  --  18.0*   < > 21.0* 25.4* 20.8* 24.6*  --   PLT  --  240   < >  --  331 352 398  --   LABPROT 19.6*  --   --  17.1*  --  17.2*  --   --   INR 1.7*  --   --  1.4*  --  1.4*  --   --   HEPARINUNFRC  --   --   --   --   --   --  0.11* 0.21*  CREATININE  --  2.09*  --   --  1.62* 1.29*  --   --    < > = values in this interval not displayed.    Estimated Creatinine Clearance: 42.8 mL/min (A) (by C-G formula based on SCr of 1.29 mg/dL (H)).   Medical History: Past Medical History:  Diagnosis Date  . Anxiety    h/o  . COPD (chronic obstructive pulmonary disease) (HCC)   . Diabetes mellitus without complication (HCC)   . GERD (gastroesophageal reflux disease)   . Hypertension    bp under control-off meds since 2019    Medications:  Medications Prior to Admission   Medication Sig Dispense Refill Last Dose  . albuterol (PROVENTIL HFA;VENTOLIN HFA) 108 (90 Base) MCG/ACT inhaler Inhale 2 puffs into the lungs every 6 (six) hours as needed for wheezing or shortness of breath. 1 Inhaler 2 prn at prn  . alum & mag hydroxide-simeth (MAALOX/MYLANTA) 200-200-20 MG/5ML suspension Take 30 mLs by mouth every 4 (four) hours as needed for indigestion or heartburn. 355 mL 0 prn at prn  . atorvastatin (LIPITOR) 10 MG tablet Take 1 tablet (10 mg total) by mouth daily. 90 tablet 3 12/13/2018 at Unknown time  . Biotin 1000 MCG tablet Take 1,000 tablets by mouth daily.    12/13/2018 at Unknown time  . cetirizine (ZYRTEC) 10 MG tablet Take 10 mg by mouth daily.   12/13/2018 at Unknown time  . cholecalciferol (VITAMIN D3) 25 MCG (1000 UT) tablet Take 1,000 Units by mouth daily.   12/13/2018 at Unknown time  . iron polysaccharides (NIFEREX) 150 MG capsule Take 1 capsule (150 mg total) by mouth daily. 30 capsule 3 12/13/2018 at Unknown time  . Multiple Vitamins-Minerals (WOMENS MULTIVITAMIN PO) Take 1 tablet by  mouth daily.   12/13/2018 at Unknown time  . pantoprazole (PROTONIX) 40 MG tablet Take 1 tablet (40 mg total) by mouth daily. 30 tablet 0 12/13/2018 at Unknown time  . sitaGLIPtin (JANUVIA) 100 MG tablet Take 100 mg by mouth daily.   12/13/2018 at Unknown time  . vitamin C (ASCORBIC ACID) 500 MG tablet Take 500 mg by mouth daily.   12/13/2018 at Unknown time  . warfarin (COUMADIN) 5 MG tablet Take 1 tablet (5 mg total) by mouth daily at 6 PM. 30 tablet 5 12/13/2018 at Unknown time   Scheduled:  . sodium chloride   Intravenous Once  . atorvastatin  10 mg Oral Daily  . Chlorhexidine Gluconate Cloth  6 each Topical Daily  . docusate sodium  100 mg Oral BID  . HYDROmorphone   Intravenous Q4H  . insulin aspart  0-5 Units Subcutaneous QHS  . insulin aspart  0-9 Units Subcutaneous TID WC  . iron polysaccharides  150 mg Oral Daily  . linagliptin  5 mg Oral Daily  . metoCLOPramide  5 mg  Oral TID AC  . multivitamin with minerals  1 tablet Oral Daily  . pantoprazole  40 mg Oral Daily  . polyethylene glycol  17 g Oral Daily  . senna  2 tablet Oral Daily  . sodium chloride flush  10-40 mL Intracatheter Q12H  . sodium chloride flush  3 mL Intravenous Q12H   Infusions:  . sodium chloride 125 mL/hr at 12/27/18 0141  . aztreonam 1 g (12/27/18 0018)  . heparin 1,100 Units/hr (12/26/18 1755)  . vancomycin 200 mL/hr at 12/26/18 1755   PRN: acetaminophen, ALPRAZolam, bisacodyl, diphenhydrAMINE **OR** diphenhydrAMINE, hydrALAZINE, naloxone **AND** sodium chloride flush, ondansetron (ZOFRAN) IV, ondansetron (ZOFRAN) IV, oxyCODONE, oxyCODONE, sodium chloride flush Anti-infectives (From admission, onward)   Start     Dose/Rate Route Frequency Ordered Stop   12/26/18 1630  vancomycin (VANCOCIN) IVPB 1000 mg/200 mL premix     1,000 mg 200 mL/hr over 60 Minutes Intravenous Every 24 hours 12/26/18 1606     12/25/18 1500  vancomycin (VANCOCIN) IVPB 1000 mg/200 mL premix     1,000 mg 200 mL/hr over 60 Minutes Intravenous  Once 12/25/18 1456 12/25/18 1755   12/24/18 1600  vancomycin (VANCOCIN) 1,250 mg in sodium chloride 0.9 % 250 mL IVPB     1,250 mg 166.7 mL/hr over 90 Minutes Intravenous  Once 12/24/18 1502 12/24/18 1839   12/23/18 2200  aztreonam (AZACTAM) 1 g in sodium chloride 0.9 % 100 mL IVPB     1 g 200 mL/hr over 30 Minutes Intravenous Every 8 hours 12/23/18 1522     12/23/18 1600  vancomycin (VANCOCIN) 1,750 mg in sodium chloride 0.9 % 500 mL IVPB     1,750 mg 250 mL/hr over 120 Minutes Intravenous  Once 12/23/18 1517 12/23/18 1859   12/23/18 1530  aztreonam (AZACTAM) 2 g in sodium chloride 0.9 % 100 mL IVPB     2 g 200 mL/hr over 30 Minutes Intravenous  Once 12/23/18 1522 12/23/18 2015   12/22/18 1200  clindamycin (CLEOCIN) IVPB 300 mg     300 mg 100 mL/hr over 30 Minutes Intravenous  Once 12/22/18 1146 12/22/18 1707   12/22/18 1133  clindamycin (CLEOCIN) 300 MG/50ML IVPB     Note to Pharmacy: Rogelia Mire   : cabinet override      12/22/18 1133 12/22/18 1145   12/21/18 2215  clindamycin (CLEOCIN) IVPB 300 mg     300 mg 100 mL/hr  over 30 Minutes Intravenous  Once 12/21/18 2209 12/22/18 0022   12/21/18 1500  levofloxacin (LEVAQUIN) IVPB 750 mg  Status:  Discontinued     750 mg 100 mL/hr over 90 Minutes Intravenous Daily-1800 12/21/18 1338 12/22/18 1114   12/20/18 2215  clindamycin (CLEOCIN) IVPB 300 mg     300 mg 100 mL/hr over 30 Minutes Intravenous  Once 12/20/18 2207 12/21/18 0850   12/18/18 1600  clindamycin (CLEOCIN) IVPB 300 mg     300 mg 100 mL/hr over 30 Minutes Intravenous  Once 12/18/18 1548 12/18/18 1735   12/18/18 0100  clindamycin (CLEOCIN) IVPB 300 mg     300 mg 100 mL/hr over 30 Minutes Intravenous  Once 12/18/18 0058 12/18/18 0922   12/15/18 2000  ciprofloxacin (CIPRO) IVPB 400 mg  Status:  Discontinued     400 mg 200 mL/hr over 60 Minutes Intravenous Every 12 hours 12/15/18 1811 12/18/18 1138   12/15/18 1800  metroNIDAZOLE (FLAGYL) IVPB 500 mg  Status:  Discontinued     500 mg 100 mL/hr over 60 Minutes Intravenous Every 8 hours 12/15/18 1751 12/18/18 1138      Assessment:  63 y/o F with PAD and diabetes on warfarin for history of DVT s/p multiple endovascular interventions. Patient was off warfarin for 2 weeks prior to admission for recent GI bleed. Last dose of warfarin 9/7 with difficulty reaching goal INR of 2-3. INR was sub-therapeutic at 1.4 this morning. Plan for right BKA 9/9 due to ischemic foot. Patient will be transitioned to heparin pending surgical intervention. 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 Heparin drip to be stopped at 7am on 9/9 per vascular.  Patient with unspecified anemia possibly due to blood loss. Patient is s/p 4 units PRBC this admission with last transfusion of 2 units on 9/6. Hgb was 7 this morning and PLT WNL.    Goal  of Therapy:  Heparin level 0.3-0.7 units/ml Monitor platelets by anticoagulation protocol: Yes   Plan:  09/08 @ 2200 HL 0.21 subtherapeutic. Will increase rate to 1250 units/hr will not order a HL since drip is to be stopped at 0700. Will continue to monitor.  Plan to stop heparin drip in the morning prior to procedure.  Thomasene Ripple, PharmD Clinical Pharmacist 12/27/2018 2:27 AM

## 2018-12-27 NOTE — Consult Note (Signed)
Pharmacy Antibiotic Note  Sylvia [name] is a 63 y.o. female admitted on 12/14/2018 with fever and leukocytosis after multiple invasive procedures. Patient with allergy to PCN and unable to determine if received a cephalosporin in the past. Pharmacy has been consulted for aztreonam and vancomycin dosing.  Scr stable and slightly improved to 1.21 today. Today is day 5 of antibiotics. WBC is stable and slightly elevated from yesterday. Afebrile last 24h. Patient is going for right BKA today for ischemic limb. Follow-up antibiotic plan after surgery.   Plan: Continue aztreonam 1 g q8H.   Continue vancomycin 1g q24h Goal AUC 400-550. Expected AUC: 473.7, trough 11.9 SCr used: 1.21  Will continue to monitor daily serum creatinine per protocol and adjust dosing as indicated.   Height: 5\' 7"  (170.2 cm) Weight: 152 lb 12.8 oz (69.3 kg) IBW/kg (Calculated) : 61.6  Temp (24hrs), Avg:99.1 F (37.3 C), Min:98.9 F (37.2 C), Max:99.3 F (37.4 C)  Recent Labs  Lab 12/23/18 0344 12/24/18 0732  12/24/18 1912 12/25/18 0806 12/25/18 1422 12/26/18 0328 12/26/18 1553 12/26/18 1809 12/27/18 0535  WBC 13.7* 19.2*  --  20.9* 21.1*  --  16.5*  --  17.2* 17.5*  CREATININE 2.20* 2.09*  --   --  1.62*  --  1.29*  --   --  1.21*  VANCORANDOM  --   --    < >  --   --  15  --  13  --   --    < > = values in this interval not displayed.    Estimated Creatinine Clearance: 45.7 mL/min (A) (by C-G formula based on SCr of 1.21 mg/dL (H)).    Allergies  Allergen Reactions  . Penicillins Hives    Has patient had a PCN reaction causing immediate rash, facial/tongue/throat swelling, SOB or lightheadedness with hypotension: Yes Has patient had a PCN reaction causing severe rash involving mucus membranes or skin necrosis: No Has patient had a PCN reaction that required hospitalization: No Has patient had a PCN reaction occurring within the last 10 years: No If all of the above answers are "NO", then may  proceed with Cephalosporin use.  . Tramadol Itching    Antimicrobials this admission: 9/5 aztreonam >>  9/5 vancomycin >>   Dose adjustments this admission: Creatinine improving and trending towards baseline. Will start vancomycin regimen per AUC dosing kinetics.  Microbiology results: 9/5 BCx: NGTD 9/5 MRSA PCR: negative  Thank you for allowing pharmacy to be a part of this patient's care.  [place] Resident 12/27/2018 10:09 AM

## 2018-12-28 LAB — CULTURE, BLOOD (ROUTINE X 2)
Culture: NO GROWTH
Culture: NO GROWTH
Special Requests: ADEQUATE
Special Requests: ADEQUATE

## 2018-12-28 LAB — GLUCOSE, CAPILLARY
Glucose-Capillary: 247 mg/dL — ABNORMAL HIGH (ref 70–99)
Glucose-Capillary: 170 mg/dL — ABNORMAL HIGH (ref 70–99)
Glucose-Capillary: 182 mg/dL — ABNORMAL HIGH (ref 70–99)
Glucose-Capillary: 199 mg/dL — ABNORMAL HIGH (ref 70–99)
Glucose-Capillary: 99 mg/dL (ref 70–99)

## 2018-12-28 LAB — BASIC METABOLIC PANEL
Anion gap: 9 (ref 5–15)
BUN: 24 mg/dL — ABNORMAL HIGH (ref 8–23)
CO2: 22 mmol/L (ref 22–32)
Calcium: 8 mg/dL — ABNORMAL LOW (ref 8.9–10.3)
Chloride: 109 mmol/L (ref 98–111)
Creatinine, Ser: 1.16 mg/dL — ABNORMAL HIGH (ref 0.44–1.00)
GFR calc Af Amer: 58 mL/min — ABNORMAL LOW (ref >60)
GFR calc non Af Amer: 50 mL/min — ABNORMAL LOW (ref >60)
Glucose, Bld: 188 mg/dL — ABNORMAL HIGH (ref 70–99)
Potassium: 4.3 mmol/L (ref 3.5–5.1)
Sodium: 140 mmol/L (ref 135–145)

## 2018-12-28 LAB — CBC
HCT: 22.7 % — ABNORMAL LOW (ref 36.0–46.0)
Hemoglobin: 7.7 g/dL — ABNORMAL LOW (ref 12.0–15.0)
MCH: 28.9 pg (ref 26.0–34.0)
MCHC: 33.9 g/dL (ref 30.0–36.0)
MCV: 85.3 fL (ref 80.0–100.0)
Platelets: 479 10*3/uL — ABNORMAL HIGH (ref 150–400)
RBC: 2.66 MIL/uL — ABNORMAL LOW (ref 3.87–5.11)
RDW: 14.3 % (ref 11.5–15.5)
WBC: 19.8 10*3/uL — ABNORMAL HIGH (ref 4.0–10.5)
nRBC: 0 % (ref 0.0–0.2)

## 2018-12-28 LAB — PROTIME-INR
INR: 2.1 — ABNORMAL HIGH (ref 0.8–1.2)
Prothrombin Time: 23.3 seconds — ABNORMAL HIGH (ref 11.4–15.2)

## 2018-12-28 MED ORDER — INFLUENZA VAC SPLIT QUAD 0.5 ML IM SUSY
0.5000 mL | PREFILLED_SYRINGE | INTRAMUSCULAR | Status: DC
  Filled 2018-12-28 (×2): qty 0.5

## 2018-12-28 MED ORDER — CHLORHEXIDINE GLUCONATE CLOTH 2 % EX PADS
6.0000 | MEDICATED_PAD | Freq: Every day | CUTANEOUS | Status: DC
  Administered 2018-12-28: 6 via TOPICAL

## 2018-12-28 MED ORDER — APIXABAN 5 MG PO TABS: 5.0000 mg | ORAL_TABLET | Freq: Two times a day (BID) | ORAL | Status: DC

## 2018-12-28 MED ORDER — ASPIRIN EC 81 MG PO TBEC
81.0000 mg | DELAYED_RELEASE_TABLET | Freq: Every day | ORAL | Status: DC
  Administered 2018-12-28 – 2019-01-01 (×5): 81 mg via ORAL
  Filled 2018-12-28 (×6): qty 1

## 2018-12-28 MED ORDER — ENSURE MAX PROTEIN PO LIQD
11.0000 [oz_av] | Freq: Two times a day (BID) | ORAL | Status: DC
  Administered 2018-12-28: 237 mL via ORAL
  Administered 2018-12-29 – 2018-12-31 (×3): 11 [oz_av] via ORAL
  Administered 2018-12-31: 237 mL via ORAL
  Administered 2019-01-01 – 2019-01-06 (×9): 11 [oz_av] via ORAL
  Filled 2018-12-28: qty 330

## 2018-12-28 MED ORDER — VITAMIN C 500 MG PO TABS
250.0000 mg | ORAL_TABLET | Freq: Two times a day (BID) | ORAL | Status: DC
  Administered 2018-12-28 – 2019-01-06 (×18): 250 mg via ORAL
  Filled 2018-12-28 (×18): qty 1

## 2018-12-28 NOTE — Progress Notes (Signed)
Initial Nutrition Assessment  DOCUMENTATION CODES:   Not applicable  INTERVENTION:   Ensure Max protein supplement BID, each supplement provides 150kcal and 30g of protein.  MVI daily   Vitamin C 250mg  po BID  NUTRITION DIAGNOSIS:   Increased nutrient needs related to wound healing as evidenced by increased estimated needs.  GOAL:   Patient will meet greater than or equal to 90% of their needs  MONITOR:   PO intake, Supplement acceptance, Labs, Weight trends, Skin, I & O's  REASON FOR ASSESSMENT:   Consult Assessment of nutrition requirement/status  ASSESSMENT:   63 y.o. female with a PMHx of diabetes mellitus type 2, COPD, GERD, hypertension, severe peripheral vascular disease who was admitted to [facility] on 12/14/2018 for evaluation of abdominal pain, nausea, vomiting, and diarrhea. 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  Spoke with pt via phone. RD familiar with this patient as patient was provided with vitamin K and coumadin diet education by this RD on 9/1. At that time, pt was eating 100% of meals in hospital. Pt reports that her appetite has decreased since her last RD visit. Pt relates this to pain in her leg as well as depression from having to have her leg amputated. Pt reports eating ~50% of her meals today. Pt reports that she has been trying to avoid vegetables high in vitamin K. RD discussed with patient the importance of adequate nutrition needed for wound healing. Pt is willing to drink chocolate supplements. RD will add Ensure Max protein and vitamin C to support wound healing. No new weight since 9/3; will request post op weight.    Medications reviewed and include: aspirin, colace, hydromorphone, insulin, iron, reglan, MVI, protonix, miralax, senokot, aztreonam, vancomycin   Labs reviewed: BUN 24(H),  creat 1.16(H) Mg 1.5(L)- 9/9 Wbc- 19.8(H), Hgb 7.7(L), Hct 22.7(L) cbgs- 247, 170, 182 x 24 hrs  Unable to complete Nutrition-Focused physical exam at this time.   Diet Order:   Diet Order            Diet Carb Modified Fluid consistency: Thin; Room service appropriate? Yes  Diet effective now             EDUCATION NEEDS:   Education needs have been addressed  Skin:  Skin Assessment: Reviewed RN Assessment(incision R leg s/p BKA)  Last BM:  9/9  Height:   Ht Readings from Last 1 Encounters:  12/14/18 5\' 7"  (1.702 m)    Weight:   Wt Readings from Last 1 Encounters:  12/21/18 69.3 kg    Ideal Body Weight:  57.7 kg(adjusted for R BKA)  BMI:  Body mass index is 23.93 kg/m.  Estimated Nutritional Needs:   Kcal:  1600-1800kcal/day  Protein:  80-90g/day  Fluid:  >1.6L/day  Koleen Distance MS, RD, LDN Pager #- [phone] Office#- [phone] After Hours Pager: [phone]

## 2018-12-28 NOTE — Progress Notes (Addendum)
Ch visited with pt to see how well pt was progressing post-op. Pt had a positive affect upon ch arrival. Pt shared how thankful she was for the support and words of encouragement that she received from the staff during her time here for a major surgery. Pt shared that she is confident that she will do well in rehab and has the courage to focus on her self and not take on the work of raising her great-grandkids. Pt is hopeful to move to be with her sons in [place] after Thanksgiving to focus more on her health. Ch visit was appreciated.       12/27/18 1200  Clinical Encounter Type  Visited With Patient  Visit Type Spiritual support;Social support;Pre-op;Critical Care  Spiritual Encounters  Spiritual Needs Emotional;Grief support  Stress Factors  Patient Stress Factors Health changes;Loss;Loss of control;Major life changes  Family Stress Factors Major life changes;Loss

## 2018-12-28 NOTE — Consult Note (Signed)
Pharmacy Antibiotic Note  Sylvia [name] is a 63 y.o. female admitted on 12/14/2018 with fever and leukocytosis after multiple invasive procedures. Patient with allergy to PCN and unable to determine if received a cephalosporin in the past. Pharmacy has been consulted for aztreonam and vancomycin dosing.  Scr stable and slightly improved to 1.16 today. Today is day 6 of antibiotics. WBC is trending up. Afebrile last 24h. Patient is now one day post-op from right BKA for ischemic limb. Follow-up antibiotic plan after surgery.   Plan: Continue aztreonam 1 g q8H  Continue vancomycin 1g q24h Goal AUC 400-550. Expected AUC: 473.7, trough 11.2 SCr used: 1.16  Will continue to monitor daily serum creatinine per protocol and adjust dosing as indicated. If antibiotics continued will obtain levels with afternoon vancomycin dose on 9/11.   Height: 5\' 7"  (170.2 cm) Weight: 157 lb 10.1 oz (71.5 kg) IBW/kg (Calculated) : 61.6  Temp (24hrs), Avg:98.1 F (36.7 C), Min:97.8 F (36.6 C), Max:98.4 F (36.9 C)  Recent Labs  Lab 12/24/18 0732  12/25/18 0806 12/25/18 1422 12/26/18 0328 12/26/18 1553 12/26/18 1809 12/27/18 0535 12/28/18 0432  WBC 19.2*   < > 21.1*  --  16.5*  --  17.2* 17.5* 19.8*  CREATININE 2.09*  --  1.62*  --  1.29*  --   --  1.21* 1.16*  VANCORANDOM  --    < >  --  15  --  13  --   --   --    < > = values in this interval not displayed.    Estimated Creatinine Clearance: 47.6 mL/min (A) (by C-G formula based on SCr of 1.16 mg/dL (H)).    Allergies  Allergen Reactions  . Penicillins Hives    Has patient had a PCN reaction causing immediate rash, facial/tongue/throat swelling, SOB or lightheadedness with hypotension: Yes Has patient had a PCN reaction causing severe rash involving mucus membranes or skin necrosis: No Has patient had a PCN reaction that required hospitalization: No Has patient had a PCN reaction occurring within the last 10 years: No If all of the  above answers are "NO", then may proceed with Cephalosporin use.  . Tramadol Itching    Antimicrobials this admission: aztreonam 9/5 >>  vancomycin 9/5 >>   Dose adjustments this admission: Creatinine improving and trending towards baseline. Will start vancomycin regimen per AUC dosing kinetics.  Microbiology results: 9/5 BCx: No growth 9/5 MRSA PCR: negative  Thank you for allowing pharmacy to be a part of this patient's care.  Pennie Vanblarcom L 12/28/2018 4:26 PM

## 2018-12-28 NOTE — Progress Notes (Signed)
Pt transferred to RM # [id number] at this time. VSS prior to transfer. Report given to  [place], Therapist, sports.

## 2018-12-28 NOTE — Progress Notes (Signed)
[employer] - [place] at [facility]   PATIENT NAME: Sylvia [name]    MR#:  [id number]  DATE OF BIRTH:  1955-10-06  Patient received 2 units of packed RBC transfusion, hemoglobin today is more than 8, no fever, IV antibiotics are using, patient complains of severe right leg pain, right foot is swollen than before, black discoloration of toes on the right side.  Patient is leaning forward for right foot amputation  CHIEF COMPLAINT:   Chief Complaint  Patient presents with  . Abdominal Pain   Patient complains of pain in the leg   REVIEW OF SYSTEMS:  CONSTITUTIONAL: No fever, fatigue or weakness.  EYES: No blurred or double vision.  EARS, NOSE, AND THROAT: No tinnitus or ear pain.  RESPIRATORY: No cough, shortness of breath, wheezing or hemoptysis.  CARDIOVASCULAR: No chest pain, orthopnea, edema.  GASTROINTESTINAL: No nausea or vomiting.  Has constipation.  GENITOURINARY: No dysuria, hematuria.  ENDOCRINE: No polyuria, nocturia,  HEMATOLOGY: Anemia present  sKIN:  severe right leg pain  pSYCHIATRY: No anxiety or depression.  Right leg pain, swelling, cold to touch.   DRUG ALLERGIES:   Allergies  Allergen Reactions  . Penicillins Hives    Has patient had a PCN reaction causing immediate rash, facial/tongue/throat swelling, SOB or lightheadedness with hypotension: Yes Has patient had a PCN reaction causing severe rash involving mucus membranes or skin necrosis: No Has patient had a PCN reaction that required hospitalization: No Has patient had a PCN reaction occurring within the last 10 years: No If all of the above answers are "NO", then may proceed with Cephalosporin use.  . Tramadol Itching    VITALS:  Blood pressure (!) 174/71, pulse (!) 102, temperature 98.4 F (36.9 C), temperature source Oral, resp. rate 15, height 5\' 7"  (1.702 m), weight 69.3 kg, SpO2 100 %.  PHYSICAL EXAMINATION:  GENERAL:  63 y.o.-year-old patient lying in the bed with no  acute distress.  EYES: Pupils equal, round, reactive to light and accommodation. No scleral icterus. Extraocular muscles intact.  HEENT: Head atraumatic, normocephalic. Oropharynx and nasopharynx clear.  NECK:  Supple, no jugular venous distention. No thyroid enlargement, no tenderness.  LUNGS: Clear to auscultation with occasional basilar crepitations present CARDIOVASCULAR: S1, S2 tachycardic l. No murmurs, rubs, or gallops.  ABDOMEN: Soft, nontender, nondistended. Bowel sounds present. No organomegaly or mass.  EXTREMITIES: STATUS post amputation right lower extremity nEUROLOGIC: Cranial nerves II through XII are intact. Muscle strength 4/5 in all extremities. Sensation intact. Gait not checked.  PSYCHIATRIC: The patient is alert and oriented x 3.  SKIN: Right leg pain, swelling, discoloration of the right great toe, fourth toe LABORATORY PANEL:   CBC Recent Labs  Lab 12/28/18 0432  WBC 19.8*  HGB 7.7*  HCT 22.7*  PLT 479*   ------------------------------------------------------------------------------------------------------------------  Chemistries  Recent Labs  Lab 12/25/18 0806  12/27/18 0535 12/28/18 0432  NA 141   < > 140 140  K 4.4   < > 3.6 4.3  CL 111   < > 108 109  CO2 20*   < > 22 22  GLUCOSE 104*   < > 115* 188*  BUN 28*   < > 18 24*  CREATININE 1.62*   < > 1.21* 1.16*  CALCIUM 8.6*   < > 8.3* 8.0*  MG 2.0  --  1.5*  --   AST 117*  --   --   --   ALT 56*  --   --   --  ALKPHOS 104  --   --   --   BILITOT 0.8  --   --   --    < > = values in this interval not displayed.   ------------------------------------------------------------------------------------------------------------------  Cardiac Enzymes No results for input(s): TROPONINI in the last 168 hours. ------------------------------------------------------------------------------------------------------------------  RADIOLOGY:  No results found.  ASSESSMENT AND PLAN:   Active Problems:    DKA (diabetic ketoacidoses) (HCC)  Severe peripheral artery disease related to diabetes status post multiple angiograms to the right leg, has failed all all other treatment Status post amputation   Acute blood loss anemia, status post transfusion Follow CBC  Essential hypertension.  Continue home meds,   uncontrolled diabetes mellitus type 2: With PAD ; Tradjenta, sliding scale insulin with coverage.    Acute kidney injury, likely secondary to contrast nephropathy, appreciate nephrology input     All the records are reviewed and case discussed with Care Management/Social Workerr. Management plans discussed with the patient, family and they are in agreement.  CODE STATUS: Full  TOTAL TIME TAKING CARE OF THIS PATIENT: 35 minutes.  Patient condition critical, high risk for cardiac arrest.   More than 50% time spent in counseling, coordination of care   POSSIBLE D/C IN 1-2 DAYS, DEPENDING ON CLINICAL CONDITION.   Dustin Flock M.D on 12/28/2018   Between 7am to 6pm - Pager - [phone]  After 6pm go to [email or web address] - password [email or web address] [place] [facility]  Office  [phone]  CC: Primary care physician; Center, [place]  Note: This dictation was prepared with Diplomatic Services operational officer dictation along with smaller phrase technology. Any transcriptional errors that result from this process are unintentional.

## 2018-12-28 NOTE — Anesthesia Postprocedure Evaluation (Signed)
Anesthesia Post Note  Patient: Sylvia [name]  Procedure(s) Performed: AMPUTATION BELOW KNEE (Right Knee)  Patient location during evaluation: SICU Anesthesia Type: General Level of consciousness: awake, awake and alert and oriented Pain management: pain level controlled Vital Signs Assessment: post-procedure vital signs reviewed and stable Respiratory status: spontaneous breathing and patient connected to nasal cannula oxygen Cardiovascular status: stable Postop Assessment: no apparent nausea or vomiting Anesthetic complications: no     Last Vitals:  Vitals:   12/28/18 0600 12/28/18 0700  BP: 140/73 (!) 174/83  Pulse: 85 (!) 112  Resp: (!) 23 16  Temp:    SpO2: 100% 100%    Last Pain:  Vitals:   12/28/18 0737  TempSrc:   PainSc: 6                  Diera Wirkkala Lorenza Chick

## 2018-12-28 NOTE — Progress Notes (Signed)
OT Cancellation Note  Patient Details Name: Sylvia [name] MRN: [id number] DOB: [birth year]   Cancelled Treatment:    Reason Eval/Treat Not Completed: Pain limiting ability to participate;Fatigue/lethargy limiting ability to participate. Spoke with PT. Per pt/RN requesting hold therapy this date 2/2 pain and fatigue. Will re-attempt next date as appropriate.   Jeni Salles, MPH, MS, OTR/L ascom [phone] 12/28/18, 4:45 PM

## 2018-12-28 NOTE — Progress Notes (Signed)
ANTICOAGULATION CONSULT NOTE  Pharmacy Consult for apixaban dosing Indication: Hx of DVT and s/p multiple endovascular interventions.   Allergies  Allergen Reactions  . Penicillins Hives    Has patient had a PCN reaction causing immediate rash, facial/tongue/throat swelling, SOB or lightheadedness with hypotension: Yes Has patient had a PCN reaction causing severe rash involving mucus membranes or skin necrosis: No Has patient had a PCN reaction that required hospitalization: No Has patient had a PCN reaction occurring within the last 10 years: No If all of the above answers are "NO", then may proceed with Cephalosporin use.  . Tramadol Itching    Patient Measurements: Height: 5\' 7"  (170.2 cm) Weight: 157 lb 10.1 oz (71.5 kg) IBW/kg (Calculated) : 61.6  Vital Signs: Temp: 98.4 F (36.9 C) (09/10 0800) Temp Source: Oral (09/10 0800) BP: 179/57 (09/10 1600) Pulse Rate: 102 (09/10 1600)  Labs: Recent Labs    12/26/18 0328 12/26/18 1809 12/26/18 2222 12/27/18 0535 12/28/18 0432 12/28/18 1058  HGB 7.0* 8.5*  --  8.1* 7.7*  --   HCT 20.8* 24.6*  --  23.7* 22.7*  --   PLT 352 398  --  389 479*  --   LABPROT 17.2*  --   --  20.6*  --  23.3*  INR 1.4*  --   --  1.8*  --  2.1*  HEPARINUNFRC  --  0.11* 0.21*  --   --   --   CREATININE 1.29*  --   --  1.21* 1.16*  --     Estimated Creatinine Clearance: 47.6 mL/min (A) (by C-G formula based on SCr of 1.16 mg/dL (H)).   Medical History: Past Medical History:  Diagnosis Date  . Anxiety    h/o  . COPD (chronic obstructive pulmonary disease) (HCC)   . Diabetes mellitus without complication (HCC)   . GERD (gastroesophageal reflux disease)   . Hypertension    bp under control-off meds since 2019    Medications:  Scheduled:  . sodium chloride   Intravenous Once  . aspirin EC  81 mg Oral Daily  . atorvastatin  10 mg Oral Daily  . Chlorhexidine Gluconate Cloth  6 each Topical Daily  . docusate sodium  100 mg Oral BID  .  HYDROmorphone   Intravenous Q4H  . insulin aspart  0-5 Units Subcutaneous QHS  . insulin aspart  0-9 Units Subcutaneous TID WC  . iron polysaccharides  150 mg Oral Daily  . linagliptin  5 mg Oral Daily  . metoCLOPramide  5 mg Oral TID AC  . multivitamin with minerals  1 tablet Oral Daily  . pantoprazole  40 mg Oral Daily  . polyethylene glycol  17 g Oral Daily  . Ensure Max Protein  11 oz Oral BID  . senna  2 tablet Oral Daily  . sodium chloride flush  10-40 mL Intracatheter Q12H  . sodium chloride flush  3 mL Intravenous Q12H  . vitamin C  250 mg Oral BID   Infusions:  . aztreonam 1 g (12/28/18 1513)  . vancomycin Stopped (12/27/18 1742)    Assessment: 63 y/o F with PAD and diabetes on warfarin for history of DVT s/p multiple endovascular interventions. Patient was off warfarin for 2 weeks prior to admission for recent GI bleed. Last dose of warfarin 9/7 with difficulty reaching goal INR of 2-3. 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 Pharmacy consulted for apixaban dosing.   Goal of Therapy:  Monitor platelets by anticoagulation  protocol: Yes   Plan:  INR this am was 2.1. Will obtain INR with am labs and will initiate apixaban when INR < 2.   Pharmacy will continue to monitor and adjust per consult.   Sylvia [name] L 12/28/2018,4:15 PM

## 2018-12-28 NOTE — Progress Notes (Signed)
PT Cancellation Note  Patient Details Name: Sylvia [name] MRN: [id number] DOB: [birth year]   Cancelled Treatment:    Reason Eval/Treat Not Completed: Other (comment).  New PT consult received.  Chart reviewed.  Pt's nurse reports pt with a lot of pain today (has PCA) and was worn out and was requesting to rest (pt's curtains were closed and lights were off).  Per discussion with pt's nurse, will hold PT today and re-attempt PT re-evaluation tomorrow.  Leitha Bleak, PT 12/28/18, 4:22 PM [phone]

## 2018-12-28 NOTE — Progress Notes (Signed)
[facility] Daily Progress Note   12/27/18: Right Below The Knee Amputation  12/19/18: 1.Ultrasound guidance for vascular accessleftfemoral artery 2.Catheter placement into right common femoral artery from left femoral approach 3.Aortogram and selectiverightlower extremity angiogram 4.Mechanical thrombectomy with penumbra CAT 6 device to the right SFA, popliteal artery, tibioperoneal trunk, and proximal posterior tibial arteries 5.Percutaneous transluminal angioplasty of the right tibioperoneal trunk and posterior tibial artery with 3 mm diameter by 22 cm length angioplasty balloon 6.Percutaneous transluminal angioplasty of the right popliteal artery with 4 mm diameter by 22 cm length [name] 7.Percutaneous transluminal angioplasty of the right SFA with 5 mm diameter by 30 cm length [name] 8.Placement of 8 mg of TPA to the right SFA, popliteal artery, and tibioperoneal trunk as well as placement of an infusion catheter for continuous thrombolytic therapy following the procedure using a 130 cm total length 50 cm working length lysis catheter  With take back to endovascular suite the same day for:  1.RLE angiogram 2.Catheter placement into right peroneal artery from left femoral approach 3.Mechanical thrombectomy to the right SFA, popliteal artery, tibioperoneal trunk, and peroneal artery 4.Percutaneous transluminal angioplasty ofthe right peroneal artery and tibioperoneal trunk with 2.5 mm diameter angioplasty balloon 5.Percutaneous transluminal angioplasty of the right popliteal artery with 4 mm diameter [name] 6.Percutaneous transluminal angioplasty of the right proximal SFA  with 5 mm diameter [name] 7.Viabahnstent placement to the right popliteal artery with 5 mm diameter by 10 cm length stent 8.StarClose closure device leftfemoral artery  Subjective: No issues overnight.  Pain seems to be controlled with the use of PCA at this time.  Objective: Vitals:   12/28/18 0700 12/28/18 0800 12/28/18 0900 12/28/18 1000  BP: (!) 174/83 (!) 193/170 (!) 157/71 109/85  Pulse: (!) 112 (!) 106 97 (!) 106  Resp: 16 16 13 17   Temp:  98.4 F (36.9 C)    TempSrc:  Oral    SpO2: 100% 100% 100% 100%  Weight:      Height:        Intake/Output Summary (Last 24 hours) at 12/28/2018 1054 Last data filed at 12/28/2018 0900 Gross per 24 hour  Intake 3501.88 ml  Output 2425 ml  Net 1076.88 ml   Physical Exam: A&Ox3, NAD CV: RRR Pulmonary: CTA Bilaterally Abdomen: Soft, Nontender, Nondistended Vascular:  Right lower extremity: Thigh soft.  Flexible at the knee joint.  OR dressing is clean dry and intact.  Left lower extremity: Thigh soft.  Calf soft.  Extremities warm distally to toes.   Laboratory: CBC    Component Value Date/Time   WBC 19.8 (H) 12/28/2018 0432   HGB 7.7 (L) 12/28/2018 0432   HCT 22.7 (L) 12/28/2018 0432   PLT 479 (H) 12/28/2018 0432   BMET    Component Value Date/Time   NA 140 12/28/2018 0432   K 4.3 12/28/2018 0432   CL 109 12/28/2018 0432   CO2 22 12/28/2018 0432   GLUCOSE 188 (H) 12/28/2018 0432   BUN 24 (H) 12/28/2018 0432   CREATININE 1.16 (H) 12/28/2018 0432   CALCIUM 8.0 (L) 12/28/2018 0432   GFRNONAA 50 (L) 12/28/2018 0432   GFRAA 58 (L) 12/28/2018 0432   Assessment/Planning: The patient is a 63 year old female with multiple medical issues including peripheral artery disease requiring multiple endovascular interventions now is s/p R BKA, POD#1 1) Heparin stopped.  Aspirin and Eliquis started. HIT negative. 2) Will ask social work for assistance  in paying for  Eliquis.  Patient was originally on Eliquis however was not able to afford it.  She was transitioned to Coumadin however struggled with compliance in regard to diet, dosing and INR checks.  Would really recommend Eliquis as the patient has disease to left lower extremity.  Is patient available for medication assistance? 3) PT/OT recommendations pending.  Would assume the patient will be discharged to SNF 4) would start to wean from PCA tomorrow. 5) would discontinue Foley tomorrow 6) transfer out of ICU to MedSurg without telemetry 7) OR dressing change either Saturday or Sunday  Seen and examined with Dr. Wallis Mart [facility] PA-C 12/28/2018 10:54 AM

## 2018-12-29 LAB — TYPE AND SCREEN
ABO/RH(D): O POS
Antibody Screen: NEGATIVE
Unit division: 0
Unit division: 0
Unit division: 0

## 2018-12-29 LAB — BASIC METABOLIC PANEL
Anion gap: 8 (ref 5–15)
BUN: 16 mg/dL (ref 8–23)
CO2: 23 mmol/L (ref 22–32)
Calcium: 8.2 mg/dL — ABNORMAL LOW (ref 8.9–10.3)
Chloride: 110 mmol/L (ref 98–111)
Creatinine, Ser: 1.05 mg/dL — ABNORMAL HIGH (ref 0.44–1.00)
GFR calc Af Amer: >60 mL/min (ref >60)
GFR calc non Af Amer: 56 mL/min — ABNORMAL LOW (ref >60)
Glucose, Bld: 140 mg/dL — ABNORMAL HIGH (ref 70–99)
Potassium: 3.6 mmol/L (ref 3.5–5.1)
Sodium: 141 mmol/L (ref 135–145)

## 2018-12-29 LAB — SURGICAL PATHOLOGY

## 2018-12-29 LAB — BPAM RBC
Blood Product Expiration Date: 202010052359
Blood Product Expiration Date: 202010112359
Blood Product Expiration Date: 202010112359
ISSUE DATE / TIME: 202009081248
Unit Type and Rh: 5100
Unit Type and Rh: 5100
Unit Type and Rh: 5100

## 2018-12-29 LAB — CBC
HCT: 22.1 % — ABNORMAL LOW (ref 36.0–46.0)
Hemoglobin: 7.4 g/dL — ABNORMAL LOW (ref 12.0–15.0)
MCH: 28.5 pg (ref 26.0–34.0)
MCHC: 33.5 g/dL (ref 30.0–36.0)
MCV: 85 fL (ref 80.0–100.0)
Platelets: 562 10*3/uL — ABNORMAL HIGH (ref 150–400)
RBC: 2.6 MIL/uL — ABNORMAL LOW (ref 3.87–5.11)
RDW: 14.5 % (ref 11.5–15.5)
WBC: 17.5 10*3/uL — ABNORMAL HIGH (ref 4.0–10.5)
nRBC: 0 % (ref 0.0–0.2)

## 2018-12-29 LAB — GLUCOSE, CAPILLARY
Glucose-Capillary: 129 mg/dL — ABNORMAL HIGH (ref 70–99)
Glucose-Capillary: 130 mg/dL — ABNORMAL HIGH (ref 70–99)
Glucose-Capillary: 121 mg/dL — ABNORMAL HIGH (ref 70–99)
Glucose-Capillary: 127 mg/dL — ABNORMAL HIGH (ref 70–99)

## 2018-12-29 LAB — PROTIME-INR
INR: 1.3 — ABNORMAL HIGH (ref 0.8–1.2)
Prothrombin Time: 16 seconds — ABNORMAL HIGH (ref 11.4–15.2)

## 2018-12-29 LAB — MAGNESIUM: Magnesium: 1.8 mg/dL (ref 1.7–2.4)

## 2018-12-29 LAB — PREPARE RBC (CROSSMATCH)

## 2018-12-29 MED ORDER — SODIUM CHLORIDE 0.9% IV SOLUTION
Freq: Once | INTRAVENOUS | Status: AC
  Administered 2018-12-29: 14:00:00 via INTRAVENOUS

## 2018-12-29 MED ORDER — METOPROLOL TARTRATE 5 MG/5ML IV SOLN
5.0000 mg | Freq: Four times a day (QID) | INTRAVENOUS | Status: DC | PRN
  Administered 2018-12-29: 5 mg via INTRAVENOUS
  Filled 2018-12-29: qty 5

## 2018-12-29 MED ORDER — APIXABAN 5 MG PO TABS
5.0000 mg | ORAL_TABLET | Freq: Two times a day (BID) | ORAL | Status: DC
  Administered 2018-12-29 – 2018-12-31 (×6): 5 mg via ORAL
  Filled 2018-12-29 (×6): qty 1

## 2018-12-29 NOTE — Progress Notes (Signed)
PT Cancellation Note  Patient Details Name: Sylvia [name] MRN: [id number] DOB: 1955-05-29   Cancelled Treatment:    Reason Eval/Treat Not Completed: Other (comment)(Pt spoke with RN this AM, per RN pt was tachy with mobility, has administered medication in the last hour. PT will follow up as able. (Pt also with hemoglobin 7.4, RN to alert MD).)   Lieutenant Diego PT, DPT 10:29 AM,12/29/18 [phone]

## 2018-12-29 NOTE — Evaluation (Signed)
Occupational Therapy Evaluation Patient Details Name: Sylvia [name] MRN: [id number] DOB: 07/23/1955 Today's Date: 12/29/2018    History of Present Illness Pt is a 63 y.o. female presenting to hospital 12/14/18 with abdominal pain, nausea, vomiting, and diarrhea.  Pt admitted to hospital with DKA, acute gastroenteritis, leukocytosis, and extensive aortoiliac atherosclerosis.  S/p angio 8/31 R LE d/t PAD with resting pain B LE's.  PMH includes htn, DM, PAD, anxiety, COPD, h/o DVT.   Clinical Impression   Pt seen for OT re-evaluation this date. Pt POD1 from R BKA. Pt is eager to return to PLOF with less pain and improved safety and independence. Pt currently requires min-mod assist for LB ADL while in seated position due to pain. Pt instructed in RLE positioning, R knee extension exercises, and desensitization strategies for management of residual limb and phantom pain mgt. RN in room to start blood transfusion. Further assessment with functional mobility to follow. Pt would benefit from skilled OT services including additional instruction in dressing techniques with or without assistive devices for dressing and bathing skills to support recall and carryover prior to discharge and ultimately to maximize safety, independence, and minimize falls risk and caregiver burden. Recommending [place] services to support return to baseline function prior to amputation pending progress with therapy.     Follow Up Recommendations  Home health OT    Equipment Recommendations  3 in 1 bedside commode    Recommendations for Other Services       Precautions / Restrictions Precautions Precautions: Fall Precaution Comments: RLE BKA Restrictions Weight Bearing Restrictions: Yes RLE Weight Bearing: Non weight bearing      Mobility Bed Mobility               General bed mobility comments: deferred 2/2 blood transfusion about to start  Transfers                 General transfer comment:  deferred 2/2 blood transfusion about to start    Balance                                           ADL either performed or assessed with clinical judgement   ADL Overall ADL's : Needs assistance/impaired                                       General ADL Comments: Min-Mod A for LB ADL tasks, toilet transfer deferred 2/2 RN initiating a blood transfusion, will continue to assess     Vision Baseline Vision/History: No visual deficits Patient Visual Report: No change from baseline       Perception     Praxis      Pertinent Vitals/Pain Pain Assessment: Faces Faces Pain Scale: Hurts a little bit Pain Location: 0/10 at start of session, "a little bit" at end after doing R knee ROM ex Pain Descriptors / Indicators: Aching;Constant;Discomfort Pain Intervention(s): Limited activity within patient's tolerance;Monitored during session;Repositioned;Premedicated before session     Hand Dominance Right   Extremity/Trunk Assessment Upper Extremity Assessment Upper Extremity Assessment: Overall WFL for tasks assessed   Lower Extremity Assessment Lower Extremity Assessment: RLE deficits/detail RLE Deficits / Details: s/p RLE BKA, able to achieve near full knee extension RLE: Unable to fully assess due to pain   Cervical / Trunk Assessment Cervical /  Trunk Assessment: Normal   Communication Communication Communication: No difficulties   Cognition Arousal/Alertness: Awake/alert Behavior During Therapy: WFL for tasks assessed/performed Overall Cognitive Status: Within Functional Limits for tasks assessed                                     General Comments       Exercises Other Exercises Other Exercises: Pt educated in importance of proper RLE positioning for optimal knee extension and preventing R knee flexion contracture, R knee extension exercises, and desensitization strategies to promote future prosthesis use. Pt verbalized  and demonstrated understanding. Other Exercises: Pt instructed in strategies to support phantom pain. Pt verbalized understanding.   Shoulder Instructions      Home Living Family/patient expects to be discharged to:: Private residence Living Arrangements: Parent;Other relatives Available Help at Discharge: Family Type of Home: House Home Access: Ramped entrance;Stairs to enter Entrance Stairs-Number of Steps: 4 with R railing plus 8 with B railing or can use ramp from entrance in back   Home Layout: One level     Bathroom Shower/Tub: Tub/shower unit;Curtain         Home Equipment: Environmental consultant - 2 wheels;Other (comment);Shower seat          Prior Functioning/Environment Level of Independence: Independent        Comments: Pt reports no falls in past 6 months.        OT Problem List: Pain;Decreased activity tolerance;Impaired balance (sitting and/or standing);Decreased range of motion;Decreased strength      OT Treatment/Interventions: Self-care/ADL training;Patient/family education;DME and/or AE instruction;Therapeutic exercise;Therapeutic activities;Balance training    OT Goals(Current goals can be found in the care plan section) Acute Rehab OT Goals Patient Stated Goal: to go home and take care of myself again OT Goal Formulation: With patient Time For Goal Achievement: 01/12/19 Potential to Achieve Goals: Good ADL Goals Additional ADL Goal #1: Pt will demonstrate independence with desensitization strategies to support recovery and preparation for future prosthesis use.  OT Frequency: Min 2X/week   Barriers to D/C:            Co-evaluation              AM-PAC OT "6 Clicks" Daily Activity     Outcome Measure Help from another person eating meals?: None Help from another person taking care of personal grooming?: None Help from another person toileting, which includes using toliet, bedpan, or urinal?: A Little Help from another person bathing (including  washing, rinsing, drying)?: A Little Help from another person to put on and taking off regular upper body clothing?: None Help from another person to put on and taking off regular lower body clothing?: A Little 6 Click Score: 21   End of Session    Activity Tolerance: Patient tolerated treatment well;Other (comment)(further assessment limited 2/2 RN starting blood transfusion) Patient left: in bed;with call bell/phone within reach;with bed alarm set;with nursing/sitter in room  OT Visit Diagnosis: Pain;Other abnormalities of gait and mobility (R26.89) Pain - Right/Left: Right Pain - part of body: Leg                Time: 8588-5027 OT Time Calculation (min): 10 min Charges:  OT General Charges $OT Visit: 1 Visit OT Evaluation $OT Re-eval: 1 Re-eval  Richrd Prime, MPH, MS, OTR/L ascom [phone] 12/29/18, 2:23 PM

## 2018-12-29 NOTE — Progress Notes (Signed)
[place] for apixaban dosing Indication: Hx of DVT and s/p multiple endovascular interventions.   Patient Measurements: Height: 5\' 7"  (170.2 cm) Weight: 152 lb 12.5 oz (69.3 kg) IBW/kg (Calculated) : 61.6  Vital Signs: Temp: 99 F (37.2 C) (09/11 0513) Temp Source: Oral (09/11 0513) BP: 159/75 (09/11 0513) Pulse Rate: 106 (09/11 0513)  Labs: Recent Labs    12/26/18 1809 12/26/18 2222 12/27/18 0535 12/28/18 0432 12/28/18 1058 12/29/18 0531  HGB 8.5*  --  8.1* 7.7*  --  7.4*  HCT 24.6*  --  23.7* 22.7*  --  22.1*  PLT 398  --  389 479*  --  562*  LABPROT  --   --  20.6*  --  23.3*  --   INR  --   --  1.8*  --  2.1*  --   HEPARINUNFRC 0.11* 0.21*  --   --   --   --   CREATININE  --   --  1.21* 1.16*  --  1.05*    Estimated Creatinine Clearance: 52.6 mL/min (A) (by C-G formula based on SCr of 1.05 mg/dL (H)).   Medical History: Past Medical History:  Diagnosis Date  . Anxiety    h/o  . COPD (chronic obstructive pulmonary disease) ([place])   . Diabetes mellitus without complication ([place])   . GERD (gastroesophageal reflux disease)   . Hypertension    bp under control-off meds since 2019    Medications:  Scheduled:  . aspirin EC  81 mg Oral Daily  . atorvastatin  10 mg Oral Daily  . Chlorhexidine Gluconate Cloth  6 each Topical Daily  . docusate sodium  100 mg Oral BID  . HYDROmorphone   Intravenous Q4H  . influenza vac split quadrivalent PF  0.5 mL Intramuscular Tomorrow-1000  . insulin aspart  0-5 Units Subcutaneous QHS  . insulin aspart  0-9 Units Subcutaneous TID WC  . iron polysaccharides  150 mg Oral Daily  . linagliptin  5 mg Oral Daily  . metoCLOPramide  5 mg Oral TID AC  . multivitamin with minerals  1 tablet Oral Daily  . pantoprazole  40 mg Oral Daily  . polyethylene glycol  17 g Oral Daily  . Ensure Max Protein  11 oz Oral BID  . senna  2 tablet Oral Daily  . sodium chloride flush  10-40 mL Intracatheter Q12H   . sodium chloride flush  3 mL Intravenous Q12H  . vitamin C  250 mg Oral BID   Infusions:  . aztreonam Stopped (12/29/18 0047)  . vancomycin Stopped (12/28/18 1816)    Assessment: 63 y/o F with PAD and diabetes on warfarin for history of DVT s/p multiple endovascular interventions. Patient was off warfarin for 2 weeks prior to admission for recent GI bleed. Last dose of warfarin 9/7 with difficulty reaching goal INR of 2-3. 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 Pharmacy consulted for apixaban dosing.   Goal of Therapy:  Monitor platelets by anticoagulation protocol: Yes   Plan:   INR this am is 1.3  Begin apixaban 5mg  BID starting this morning  CBC at least every 3 days per protocol  Pharmacy will continue to monitor and adjust per consult.   Dallie Piles, PharmD 12/29/2018,7:31 AM

## 2018-12-29 NOTE — Consult Note (Signed)
Pharmacy Antibiotic Note  Sylvia [name] is a 63 y.o. female admitted on 12/14/2018 with fever and leukocytosis after multiple invasive procedures. Patient with allergy to PCN and unable to determine if received a cephalosporin in the past. Pharmacy has been consulted for aztreonam and vancomycin dosing.  SCr stable and slightly improved to 1.05 today. Today is day 7 of antibiotics. WBC is trending back down. Afebrile last 24h. Patient is now two days post-op from right BKA for ischemic limb. I have communicated with K Stegmeyer, PAC from surgery and the plan is to discontinue antibiotics at the end of the day on 12/31/18. I have placed the order for the ASO.   Plan: 1) Continue aztreonam 1 g q8H  2) Continue vancomycin 1g q24h Goal AUC 400-550. Expected AUC: 455 SCr used: 1.05 T1/2 14.6h Css 30.8/11.1 mcg/mL SCr in am  Height: 5\' 7"  (170.2 cm) Weight: 152 lb 12.5 oz (69.3 kg) IBW/kg (Calculated) : 61.6  Temp (24hrs), Avg:99.4 F (37.4 C), Min:98.9 F (37.2 C), Max:100 F (37.8 C)  Recent Labs  Lab 12/25/18 0806 12/25/18 1422 12/26/18 0328 12/26/18 1553 12/26/18 1809 12/27/18 0535 12/28/18 0432 12/29/18 0531  WBC 21.1*  --  16.5*  --  17.2* 17.5* 19.8* 17.5*  CREATININE 1.62*  --  1.29*  --   --  1.21* 1.16* 1.05*  VANCORANDOM  --  15  --  13  --   --   --   --     Estimated Creatinine Clearance: 52.6 mL/min (A) (by C-G formula based on SCr of 1.05 mg/dL (H)).    Allergies  Allergen Reactions  . Penicillins Hives    Has patient had a PCN reaction causing immediate rash, facial/tongue/throat swelling, SOB or lightheadedness with hypotension: Yes Has patient had a PCN reaction causing severe rash involving mucus membranes or skin necrosis: No Has patient had a PCN reaction that required hospitalization: No Has patient had a PCN reaction occurring within the last 10 years: No If all of the above answers are "NO", then may proceed with Cephalosporin use.  . Tramadol  Itching    Antimicrobials this admission: aztreonam 9/5 >>  vancomycin 9/5 >>   Dose adjustments this admission: Creatinine improving and trending towards baseline. Will start vancomycin regimen per AUC dosing kinetics.  Microbiology results: 9/5 BCx: No growth 9/5 MRSA PCR: negative  Thank you for allowing pharmacy to be a part of this patient's care.  Dallie Piles, PharmD 12/29/2018 1:31 PM

## 2018-12-29 NOTE — Progress Notes (Signed)
OT Cancellation Note  Patient Details Name: Sylvia [name] MRN: [id number] DOB: 07-18-1955   Cancelled Treatment:    Reason Eval/Treat Not Completed: Other (comment). Consult received, chart reviewed. Nursing in with pt for pt care and per RN pt with elevated HR. Will re-attempt at later date/time as pt is available and medically appropriate.  Jeni Salles, MPH, MS, OTR/L ascom [phone] 12/29/18, 10:26 AM

## 2018-12-29 NOTE — Progress Notes (Signed)
PT Cancellation Note  Patient Details Name: Sylvia [name] MRN: [id number] DOB: October 23, 1955   Cancelled Treatment:    Reason Eval/Treat Not Completed: Medical issues which prohibited therapy(Evaluation re-attempted. Patient currently receiving blood transfusion.  Will continue hold and re-attempt next date as medically appropriate and available.)   Holger Sokolowski H. Owens Shark, PT, DPT, NCS 12/29/18, 2:14 PM [phone]

## 2018-12-29 NOTE — Progress Notes (Signed)
Pt had episode of 115- 120 sustaining HR. MD was notified and order received.   Update: pt resting HR is 105 .

## 2018-12-29 NOTE — Progress Notes (Signed)
[name] Daily Progress Note   Subjective: 12/27/18: Right Below The Knee Amputation  12/19/18: 1.Ultrasound guidance for vascular accessleftfemoral artery 2.Catheter placement into right common femoral artery from left femoral approach 3.Aortogram and selectiverightlower extremity angiogram 4.Mechanical thrombectomy with penumbra CAT 6 device to the right SFA, popliteal artery, tibioperoneal trunk, and proximal posterior tibial arteries 5.Percutaneous transluminal angioplasty of the right tibioperoneal trunk and posterior tibial artery with 3 mm diameter by 22 cm length angioplasty balloon 6.Percutaneous transluminal angioplasty of the right popliteal artery with 4 mm diameter by 22 cm length [name] 7.Percutaneous transluminal angioplasty of the right SFA with 5 mm diameter by 30 cm length [name] 8.Placement of 8 mg of TPA to the right SFA, popliteal artery, and tibioperoneal trunk as well as placement of an infusion catheter for continuous thrombolytic therapy following the procedure using a 130 cm total length 50 cm working length lysis catheter  With take back to endovascular suite the same day for:  1.RLE angiogram 2.Catheter placement into right peroneal artery from left femoral approach 3.Mechanical thrombectomy to the right SFA, popliteal artery, tibioperoneal trunk, and peroneal artery 4.Percutaneous transluminal angioplasty ofthe right peroneal artery and tibioperoneal trunk with 2.5 mm diameter angioplasty balloon 5.Percutaneous transluminal angioplasty of the right popliteal artery with 4 mm diameter [name] 6.Percutaneous transluminal angioplasty of the  right proximal SFA with 5 mm diameter [name] 7.Viabahnstent placement to the right popliteal artery with 5 mm diameter by 10 cm length stent 8.StarClose closure device leftfemoral artery  Patient with continued right stump pain.   Objective: Vitals:   12/29/18 1139 12/29/18 1213 12/29/18 1222 12/29/18 1317  BP: (!) 152/67 (!) 154/77  (!) 149/63  Pulse: (!) 104 (!) 105  99  Resp: 20  16 20   Temp: 100 F (37.8 C)   99.9 F (37.7 C)  TempSrc: Oral   Oral  SpO2: 98%  100% 96%  Weight:      Height:        Intake/Output Summary (Last 24 hours) at 12/29/2018 1403 Last data filed at 12/29/2018 1318 Gross per 24 hour  Intake 740.07 ml  Output 3950 ml  Net -3209.93 ml   Physical Exam: A&Ox3, NAD CV: RRR Pulmonary: CTA Bilaterally Abdomen: Soft, Nontender, Nondistended Vascular:             Right lower extremity: Thigh soft.  Flexible at the knee joint.  OR dressing is clean dry and intact.             Left lower extremity: Thigh soft.  Calf soft.  Extremities warm distally to toes.   Laboratory: CBC    Component Value Date/Time   WBC 17.5 (H) 12/29/2018 0531   HGB 7.4 (L) 12/29/2018 0531   HCT 22.1 (L) 12/29/2018 0531   PLT 562 (H) 12/29/2018 0531   BMET    Component Value Date/Time   NA 141 12/29/2018 0531   K 3.6 12/29/2018 0531   CL 110 12/29/2018 0531   CO2 23 12/29/2018 0531   GLUCOSE 140 (H) 12/29/2018 0531   BUN 16 12/29/2018 0531   CREATININE 1.05 (H) 12/29/2018 0531   CALCIUM 8.2 (L) 12/29/2018 0531   GFRNONAA 56 (L) 12/29/2018 0531   GFRAA >60 12/29/2018 0531   Assessment/Planning: The patient is a 63 year old female with multiple medical issues including peripheral artery disease requiring multiple endovascular interventions now is s/p R BKA, POD#2 1) Patient has not worked  with PT / OT since surgery - strongly recommend 2) Would assume dispo to SNF however awaiting recommendations from PT /  OT 3) Eliquis / ASA restarted 4) Would start to wean off PCA 5) Discontinue foley 6) AM labs  Discussed with Dr. Ellis Parents Sylvia Fundora PA-C 12/29/2018 2:03 PM

## 2018-12-29 NOTE — Progress Notes (Signed)
[place] [facility] [place] NAME: Sylvia [name]    MR#:  [id number]  DATE OF BIRTH:  02-Jun-1955  Patient received 2 units of packed RBC transfusion, hemoglobin today is more than 8, no fever, IV antibiotics are using, patient complains of severe right leg pain, right foot is swollen than before, black discoloration of toes on the right side.  Patient is leaning forward for right foot amputation  CHIEF COMPLAINT:   Chief Complaint  Patient presents with  . Abdominal Pain   Tenuous to complain of pain in the leg, is noted to have elevated heart rate   REVIEW OF SYSTEMS:  CONSTITUTIONAL: No fever, fatigue or weakness.  EYES: No blurred or double vision.  EARS, NOSE, AND THROAT: No tinnitus or ear pain.  RESPIRATORY: No cough, shortness of breath, wheezing or hemoptysis.  CARDIOVASCULAR: No chest pain, orthopnea, edema.  GASTROINTESTINAL: No nausea or vomiting.  Has constipation.  GENITOURINARY: No dysuria, hematuria.  ENDOCRINE: No polyuria, nocturia,  HEMATOLOGY: Anemia present  sKIN:  severe right leg pain  pSYCHIATRY: No anxiety or depression.  Right leg pain,    DRUG ALLERGIES:   Allergies  Allergen Reactions  . Penicillins Hives    Has patient had a PCN reaction causing immediate rash, facial/tongue/throat swelling, SOB or lightheadedness with hypotension: Yes Has patient had a PCN reaction causing severe rash involving mucus membranes or skin necrosis: No Has patient had a PCN reaction that required hospitalization: No Has patient had a PCN reaction occurring within the last 10 years: No If all of the above answers are "NO", then may proceed with Cephalosporin use.  . Tramadol Itching    VITALS:  Blood pressure (!) 154/77, pulse (!) 105, temperature 100 F (37.8 C), temperature source Oral, resp. rate 16, height 5\' 7"  (1.702 m), weight 69.3 kg, SpO2 100 %.  PHYSICAL EXAMINATION:  GENERAL:  63 y.o.-year-old patient lying in the  bed with no acute distress.  EYES: Pupils equal, round, reactive to light and accommodation. No scleral icterus. Extraocular muscles intact.  HEENT: Head atraumatic, normocephalic. Oropharynx and nasopharynx clear.  NECK:  Supple, no jugular venous distention. No thyroid enlargement, no tenderness.  LUNGS: Clear to auscultation with occasional basilar crepitations present CARDIOVASCULAR: S1, S2 tachycardic l. No murmurs, rubs, or gallops.  ABDOMEN: Soft, nontender, nondistended. Bowel sounds present. No organomegaly or mass.  EXTREMITIES: STATUS post amputation right lower extremity nEUROLOGIC: Cranial nerves II through XII are intact. Muscle strength 4/5 in all extremities. Sensation intact. Gait not checked.  PSYCHIATRIC: The patient is alert and oriented x 3.  SKIN: Right leg pain, swelling, discoloration of the right great toe, fourth toe LABORATORY PANEL:   CBC Recent Labs  Lab 12/29/18 0531  WBC 17.5*  HGB 7.4*  HCT 22.1*  PLT 562*   ------------------------------------------------------------------------------------------------------------------  Chemistries  Recent Labs  Lab 12/25/18 0806  12/29/18 0531  NA 141   < > 141  K 4.4   < > 3.6  CL 111   < > 110  CO2 20*   < > 23  GLUCOSE 104*   < > 140*  BUN 28*   < > 16  CREATININE 1.62*   < > 1.05*  CALCIUM 8.6*   < > 8.2*  MG 2.0   < > 1.8  AST 117*  --   --   ALT 56*  --   --   ALKPHOS 104  --   --   BILITOT 0.8  --   --    < > =  values in this interval not displayed.   ------------------------------------------------------------------------------------------------------------------  Cardiac Enzymes No results for input(s): TROPONINI in the last 168 hours. ------------------------------------------------------------------------------------------------------------------  RADIOLOGY:  No results found.  ASSESSMENT AND PLAN:   Active Problems:   DKA (diabetic ketoacidoses) (HCC)  Severe peripheral artery  disease related to diabetes status post multiple angiograms to the right leg, has failed all all other treatment Status post amputation   Acute blood loss anemia, status post transfusion Patient with sinus tachycardia we will give 1 unit of packed RBCs  Essential hypertension.  Continue home meds,  Uncontrolled diabetes mellitus type 2: With PAD ; Tradjenta, sliding scale insulin with coverage.    Acute kidney injury, likely secondary to contrast nephropathy, appreciate nephrology input     All the records are reviewed and case discussed with Care Management/Social Workerr. Management plans discussed with the patient, family and they are in agreement.  CODE STATUS: Full  TOTAL TIME TAKING CARE OF THIS PATIENT: 35 minutes.  Patient condition critical, high risk for cardiac arrest.   More than 50% time spent in counseling, coordination of care   POSSIBLE D/C IN 1-2 DAYS, DEPENDING ON CLINICAL CONDITION.   Auburn Bilberry M.D on 12/29/2018   Between 7am to 6pm - Pager - [phone]  After 6pm go to [email or web address] - Social research officer, government  [facility]  Office  [phone]  CC: Primary care physician; Center, [facility]  Note: This dictation was prepared with Nurse, children's dictation along with smaller phrase technology. Any transcriptional errors that result from this process are unintentional.

## 2018-12-29 NOTE — Progress Notes (Signed)
Md ordered blood transfusion for 1 unit but Blood bank declined to release blood since pt is not actively bleeding and hgb is 7.4. Per MD hold off for now. Blood transfusion order will be dc;d for now.

## 2018-12-29 NOTE — TOC Initial Note (Signed)
Transition of Care [facility]) - Initial/Assessment Note    Patient Details  Name: Sylvia [name] MRN: [id number] Date of Birth: 1956-01-18  Transition of Care [facility]) CM/SW Contact:    Beverly Sessions, RN Phone Number: 12/29/2018, 10:54 AM  Clinical Narrative:                  Attempedt to meet with patient to discuss discharge planning.  Patient currently getting EKG.  RNCM to follow up       Patient Goals and CMS Choice        Expected Discharge Plan and Services                                                Prior Living Arrangements/Services                       Activities of Daily Living Home Assistive Devices/Equipment: Blood pressure cuff(cbg meter broke) ADL Screening (condition at time of admission) Patient's cognitive ability adequate to safely complete daily activities?: No Is the patient deaf or have difficulty hearing?: No Does the patient have difficulty seeing, even when wearing glasses/contacts?: No Does the patient have difficulty concentrating, remembering, or making decisions?: No Patient able to express need for assistance with ADLs?: Yes Does the patient have difficulty dressing or bathing?: No Independently performs ADLs?: Yes (appropriate for developmental age) Does the patient have difficulty walking or climbing stairs?: No Weakness of Legs: None Weakness of Arms/Hands: None  Permission Sought/Granted                  Emotional Assessment              Admission diagnosis:  Generalized abdominal pain [R10.84] PAD (peripheral artery disease) ([place]) [I73.9] Type 2 diabetes mellitus with ketoacidosis without coma, without long-term current use of insulin ([place]) [E11.10] DKA (diabetic ketoacidoses) ([place]) [E11.10] Patient Active Problem List   Diagnosis Date Noted  . DKA (diabetic ketoacidoses) ([place]) 12/14/2018  . Atherosclerosis of artery of extremity with rest pain ([place]) 08/28/2018  . PAD (peripheral artery  disease) ([place]) 07/14/2018  . Abdominal pain 06/13/2018  . HTN (hypertension) 06/13/2018  . Diabetes ([facility]) 06/13/2018  . Elevated lactic acid level 06/13/2018   PCP:  Center, [place]:   [place] (N), [place] [address] [place]) [place] [address] Phone: [phone] Fax: [place], [address] [place] [address] Phone: [phone] Fax: [phone]     Social Determinants of Health (SDOH) Interventions    Readmission Risk Interventions No flowsheet data found.

## 2018-12-30 LAB — CBC
HCT: 22.9 % — ABNORMAL LOW (ref 36.0–46.0)
Hemoglobin: 7.6 g/dL — ABNORMAL LOW (ref 12.0–15.0)
MCH: 28.3 pg (ref 26.0–34.0)
MCHC: 33.2 g/dL (ref 30.0–36.0)
MCV: 85.1 fL (ref 80.0–100.0)
Platelets: 672 10*3/uL — ABNORMAL HIGH (ref 150–400)
RBC: 2.69 MIL/uL — ABNORMAL LOW (ref 3.87–5.11)
RDW: 14.6 % (ref 11.5–15.5)
WBC: 17.3 10*3/uL — ABNORMAL HIGH (ref 4.0–10.5)
nRBC: 0.1 % (ref 0.0–0.2)

## 2018-12-30 LAB — BASIC METABOLIC PANEL
Anion gap: 13 (ref 5–15)
BUN: 15 mg/dL (ref 8–23)
CO2: 23 mmol/L (ref 22–32)
Calcium: 8.6 mg/dL — ABNORMAL LOW (ref 8.9–10.3)
Chloride: 106 mmol/L (ref 98–111)
Creatinine, Ser: 1.08 mg/dL — ABNORMAL HIGH (ref 0.44–1.00)
GFR calc Af Amer: >60 mL/min (ref >60)
GFR calc non Af Amer: 54 mL/min — ABNORMAL LOW (ref >60)
Glucose, Bld: 122 mg/dL — ABNORMAL HIGH (ref 70–99)
Potassium: 3.9 mmol/L (ref 3.5–5.1)
Sodium: 142 mmol/L (ref 135–145)

## 2018-12-30 LAB — GLUCOSE, CAPILLARY
Glucose-Capillary: 152 mg/dL — ABNORMAL HIGH (ref 70–99)
Glucose-Capillary: 171 mg/dL — ABNORMAL HIGH (ref 70–99)
Glucose-Capillary: 148 mg/dL — ABNORMAL HIGH (ref 70–99)
Glucose-Capillary: 131 mg/dL — ABNORMAL HIGH (ref 70–99)

## 2018-12-30 LAB — MAGNESIUM: Magnesium: 1.6 mg/dL — ABNORMAL LOW (ref 1.7–2.4)

## 2018-12-30 MED ORDER — SODIUM CHLORIDE 0.9 % IV SOLN
1.0000 g | Freq: Three times a day (TID) | INTRAVENOUS | Status: AC
  Administered 2018-12-30 (×2): 1 g via INTRAVENOUS
  Filled 2018-12-30 (×2): qty 1

## 2018-12-30 MED ORDER — METOPROLOL TARTRATE 50 MG PO TABS
50.0000 mg | ORAL_TABLET | Freq: Two times a day (BID) | ORAL | Status: DC
  Administered 2018-12-30 – 2019-01-06 (×15): 50 mg via ORAL
  Filled 2018-12-30 (×2): qty 2
  Filled 2018-12-30 (×5): qty 1
  Filled 2018-12-30 (×2): qty 2
  Filled 2018-12-30: qty 1
  Filled 2018-12-30 (×2): qty 2
  Filled 2018-12-30: qty 1
  Filled 2018-12-30 (×2): qty 2

## 2018-12-30 MED ORDER — HYDROMORPHONE HCL 1 MG/ML IJ SOLN
1.0000 mg | INTRAMUSCULAR | Status: DC | PRN
  Administered 2018-12-30 – 2019-01-05 (×11): 1 mg via INTRAVENOUS
  Filled 2018-12-30 (×11): qty 1

## 2018-12-30 MED ORDER — VANCOMYCIN HCL IN DEXTROSE 1-5 GM/200ML-% IV SOLN
1000.0000 mg | INTRAVENOUS | Status: AC
  Administered 2018-12-30: 1000 mg via INTRAVENOUS
  Filled 2018-12-30: qty 200

## 2018-12-30 MED ORDER — ATORVASTATIN CALCIUM 20 MG PO TABS
40.0000 mg | ORAL_TABLET | Freq: Every day | ORAL | Status: DC
  Administered 2018-12-30 – 2019-01-05 (×7): 40 mg via ORAL
  Filled 2018-12-30 (×7): qty 2

## 2018-12-30 MED ORDER — MAGNESIUM SULFATE 2 GM/50ML IV SOLN
2.0000 g | Freq: Once | INTRAVENOUS | Status: AC
  Administered 2018-12-30: 2 g via INTRAVENOUS
  Filled 2018-12-30: qty 50

## 2018-12-30 NOTE — TOC Initial Note (Signed)
Transition of Care [facility]) - Initial/Assessment Note    Patient Details  Name: Sylvia [name] MRN: [id number] Date of Birth: 1955/12/13  [facility]) CM/SW Contact:    Darleene Cleaver, LCSW Phone Number: 12/30/2018, 5:27 PM  Clinical Narrative:                  CSW spoke with patient to discuss home health options.  CSW confirmed with patient that she does not have any insurance.  Patient states that she has applied for Medicaid and is waiting to see if she is approved.  Patient states she lives with her daughter and grandkids, but the plan is to go to her sister's house at [address] [place], [place], [address].  CSW spoke to [place] at [facility] who is covering charity this week, she will contact patient to discuss financials.  Patient would like a wheelchair if possible, she states she has a bedside commode and a walker at home already.  CSW informed her that she may be able to get it charity, but this CSW was unsure.  Patient did not express any other questions or concerns, patient was pleasant and motivated to get better.     Expected Discharge Plan: Home w Home Health Services Barriers to Discharge: Continued Medical Work up, Inadequate or no insurance   Patient Goals and CMS Choice Patient states their goals for this hospitalization and ongoing recovery are:: To return back home with home health. CMS Medicare.gov Compare Post Acute Care list provided to:: Patient Choice offered to / list presented to : Patient  Expected Discharge Plan and Services Expected Discharge Plan: Home w Home Health Services In-house Referral: Clinical Social Work   Post Acute Care Choice: Home Health Living arrangements for the past 2 months: Single Family Home                           HH Arranged: RN, PT HH Agency: [facility] Date HH Agency Contacted: 12/30/18 Time HH Agency Contacted: 1720 Representative spoke with at [facility] Agency:  Rosey Bath  Prior Living Arrangements/Services Living arrangements for the past 2 months: Single Family Home Lives with:: Adult Children, Relatives Patient language and need for interpreter reviewed:: Yes Do you feel safe going back to the place where you live?: Yes      Need for Family Participation in Patient Care: No (Comment) Care giver support system in place?: Yes (comment)   Criminal Activity/Legal Involvement Pertinent to Current Situation/Hospitalization: No - Comment as needed  Activities of Daily Living Home Assistive Devices/Equipment: Blood pressure cuff(cbg meter broke) ADL Screening (condition at time of admission) Patient's cognitive ability adequate to safely complete daily activities?: No Is the patient deaf or have difficulty hearing?: No Does the patient have difficulty seeing, even when wearing glasses/contacts?: No Does the patient have difficulty concentrating, remembering, or making decisions?: No Patient able to express need for assistance with ADLs?: Yes Does the patient have difficulty dressing or bathing?: No Independently performs ADLs?: Yes (appropriate for developmental age) Does the patient have difficulty walking or climbing stairs?: No Weakness of Legs: None Weakness of Arms/Hands: None  Permission Sought/Granted Permission sought to share information with : Case Manager, Family Supports Permission granted to share information with : Yes, Verbal Permission Granted  Share Information with NAME: Genia Del  [phone], or [facility] Sister [phone]  [phone] or Diego Cory Sister [phone]  Permission granted to share info w  AGENCY: Home Health Agencies        Emotional Assessment Appearance:: Appears stated age Attitude/Demeanor/Rapport: Engaged Affect (typically observed): Accepting, Appropriate, Calm, Stable Orientation: : Oriented to Self, Oriented to Place, Oriented to  Time, Oriented to Situation Alcohol / Substance  Use: Not Applicable Psych Involvement: No (comment)  Admission diagnosis:  Generalized abdominal pain [R10.84] PAD (peripheral artery disease) (HCC) [I73.9] Type 2 diabetes mellitus with ketoacidosis without coma, without long-term current use of insulin (HCC) [E11.10] DKA (diabetic ketoacidoses) ([place]) [E11.10] Patient Active Problem List   Diagnosis Date Noted  . DKA (diabetic ketoacidoses) ([place]) 12/14/2018  . Atherosclerosis of artery of extremity with rest pain ([place]) 08/28/2018  . PAD (peripheral artery disease) ([place]) 07/14/2018  . Abdominal pain 06/13/2018  . HTN (hypertension) 06/13/2018  . Diabetes ([place]) 06/13/2018  . Elevated lactic acid level 06/13/2018   PCP:  [place], [place]:   [place] (N), [place] [address] [place]) [place] [address] Phone: [phone] Fax: [place], [place] [address] Phone: [phone] Fax: [phone]     Social Determinants of Health (SDOH) Interventions    Readmission Risk Interventions Readmission Risk Prevention Plan 12/30/2018  Transportation Screening Complete  PCP or Specialist Appt within 3-5 Days Complete  HRI or [place] Complete  Social Work Consult for [place] Planning/Counseling Complete  Palliative Care Screening Complete  Medication Review Press photographer) Complete  Some recent data might be hidden

## 2018-12-30 NOTE — Evaluation (Signed)
Physical Therapy Re-Evaluation Patient Details Name: Sylvia [name] MRN: [id number] DOB: 02/25/1956 Today's Date: 12/30/2018   History of Present Illness  Pt is a 63 y.o. female presenting to hospital 12/14/18 with abdominal pain, nausea, vomiting, and diarrhea. 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  S/p angio 8/31 R LE d/t PAD with resting pain B LE's.  PMH includes htn, DM, PAD, anxiety, COPD, h/o DVT.  Clinical Impression  Patient is a pleasant 63 year old female re-evaluated s/p R BKA. Patient demonstrated ability to transition from supine to sit EOB with use of railings on bed. Education on purse lipped breathing, resting between each transition, and use of exercise to decrease dizziness and nausea with transfers. Education on why this occurs with new amputations. Patient eager to participate and performed STS initially with Min A from lowered surface and then with CGA from raised surface. Transition from EOB to sitting in chair with SPT initially required Min A for walker guidance and max verbal cueing for sequencing of hop to pattern of ambulation. Patient is very determined and motivated to return home and would benefit from skilled physical therapy to increase mobility, strength, and stability while in the hospital. Upon discharge patient would benefit from HHPT and supervision as well as a wheelchair at this time to increase independence with mobility and quality of life.     Follow Up Recommendations Home health PT;Supervision/Assistance - 24 hour    Equipment Recommendations  Rolling walker with 5" wheels;3in1 (PT);Wheelchair (measurements PT);Wheelchair cushion (measurements PT)    Recommendations for Other Services       Precautions / Restrictions Precautions Precautions: Fall Precaution Comments: RLE BKA Restrictions Weight Bearing Restrictions:  Yes RLE Weight Bearing: Non weight bearing      Mobility  Bed Mobility Overal bed mobility: Needs Assistance Bed Mobility: Supine to Sit     Supine to sit: Min guard;HOB elevated     General bed mobility comments: Requires use of railing to pull self to EOB, Able to scoot to edge of bed using UE support.  Transfers Overall transfer level: Needs assistance Equipment used: Rolling walker (2 wheeled) Transfers: Sit to/from [employer];Lateral/Scoot Transfers Sit to Stand: Min guard;Min assist;From elevated surface Stand pivot transfers: Min guard;Min assist      Lateral/Scoot Transfers: Modified independent (Device/Increase time) General transfer comment: Patient able to perform STS with Min A from lower surface and CGA from raised surface, cueing for hand placement for safety. SPT requires Min assistance for walker placement and hop to pattern of cueing.  Ambulation/Gait                Stairs            Wheelchair Mobility    Modified Rankin (Stroke Patients Only)       Balance Overall balance assessment: Needs assistance Sitting-balance support: No upper extremity supported Sitting balance-Leahy Scale: Good Sitting balance - Comments: steady sitting reaching within BOS   Standing balance support: Bilateral upper extremity supported Standing balance-Leahy Scale: Poor Standing balance comment: requires use of RW for stabilizing on residual LLE.               High Level Balance Comments: able to hop to chair with RW and Min A for placement of walker and sequencing.             Pertinent Vitals/Pain Pain Score: 3  Pain  Location: Hurts a bit with transfers in residual limb Pain Intervention(s): Limited activity within patient's tolerance;Monitored during session;Repositioned;Relaxation;Utilized relaxation techniques    Home Living Family/patient expects to be discharged to:: Private residence Living Arrangements: Parent;Other  relatives(grandsons in their twenties.) Available Help at Discharge: Family Type of Home: House Home Access: Ramped entrance;Stairs to enter   Entrance Stairs-Number of Steps: 4 with R railing plus 8 with B railing or can use ramp from entrance in back Home Layout: One level Home Equipment: Walker - 2 wheels;Other (comment);Shower seat;Bedside commode Additional Comments: Patient reports she has equipment from her mother who passed away from cancer. Has railings she can add to her bed, has a bed side commode she can bring out of the closet.    Prior Function Level of Independence: Independent         Comments: Pt reports no falls in past 6 months.     Hand Dominance   Dominant Hand: Right    Extremity/Trunk Assessment   Upper Extremity Assessment Upper Extremity Assessment: Defer to OT evaluation    Lower Extremity Assessment Lower Extremity Assessment: Generalized weakness;RLE deficits/detail;LLE deficits/detail RLE Deficits / Details: s/p RLE BKA, able to achieve near full knee extension, able to perform AROM in all directions with slight difficulty indicating gross 2+/5 strength RLE: Unable to fully assess due to pain LLE Deficits / Details: grossly 4-/5    Cervical / Trunk Assessment Cervical / Trunk Assessment: Normal  Communication   Communication: No difficulties  Cognition Arousal/Alertness: Awake/alert Behavior During Therapy: WFL for tasks assessed/performed Overall Cognitive Status: Within Functional Limits for tasks assessed                                 General Comments: eager to participate with physical therapy      General Comments General comments (skin integrity, edema, etc.): Residual limb bandaged.    Exercises Total Joint Exercises Ankle Circles/Pumps: AROM;Strengthening;Both;10 reps;Seated Hip ABduction/ADduction: AROM;5 reps;Seated;Both Long Arc Quad: AROM;Strengthening;Both;10 reps;Seated Marching in Standing:  AROM;Strengthening;Both;10 reps;Seated Other Exercises Other Exercises: Patient educated on proper positioning of residual limb of RLE to prevent contractures. Patient educated on safe transfers and bed mobility, utilizing purse lipped breathing, resting at each position change to reduce nausea and dizziness, and importance of "warming her limbs up before standing". Other Exercises: SPT with Min A for walker placement, cueing for hop to pattern with patient initially requiring Min A and progressed to CGA.   Assessment/Plan    PT Assessment Patient needs continued PT services  PT Problem List Decreased strength;Decreased range of motion;Decreased activity tolerance;Decreased balance;Decreased mobility;Decreased knowledge of use of DME;Decreased knowledge of precautions;Cardiopulmonary status limiting activity;Pain;Decreased skin integrity       PT Treatment Interventions DME instruction;Gait training;Stair training;Functional mobility training;Therapeutic activities;Therapeutic exercise;Balance training;Patient/family education;Neuromuscular re-education;Wheelchair mobility training;Manual techniques    PT Goals (Current goals can be found in the Care Plan section)  Acute Rehab PT Goals Patient Stated Goal: to go home and take care of myself again PT Goal Formulation: With patient Time For Goal Achievement: 01/13/19 Potential to Achieve Goals: Fair    Frequency 7X/week   Barriers to discharge   will need a wheelchair and an aide    Co-evaluation               AM-PAC PT "6 Clicks" Mobility  Outcome Measure Help needed turning from your back to your side while in a flat bed without using bedrails?:  A Little Help needed moving from lying on your back to sitting on the side of a flat bed without using bedrails?: A Little Help needed moving to and from a bed to a chair (including a wheelchair)?: A Little Help needed standing up from a chair using your arms (e.g., wheelchair or  bedside chair)?: A Little Help needed to walk in hospital room?: A Lot Help needed climbing 3-5 steps with a railing? : A Lot 6 Click Score: 16    End of Session Equipment Utilized During Treatment: Gait belt Activity Tolerance: Patient tolerated treatment well;Patient limited by fatigue Patient left: in chair;with call bell/phone within reach;with chair alarm set(B LE's elevated via pillow with heels floating) Nurse Communication: Mobility status;Precautions;Other (comment)(able to use BSC with min A) PT Visit Diagnosis: Other abnormalities of gait and mobility (R26.89);Muscle weakness (generalized) (M62.81);Difficulty in walking, not elsewhere classified (R26.2);Pain;Unsteadiness on feet (R26.81) Pain - Right/Left: Right Pain - part of body: Leg    Time: 7253-6644 PT Time Calculation (min) (ACUTE ONLY): 34 min   Charges:   PT Evaluation $PT Re-evaluation: 1 Re-eval PT Treatments $Therapeutic Activity: 8-22 mins        Janna Arch, PT, DPT    Janna Arch 12/30/2018, 9:48 AM

## 2018-12-30 NOTE — Progress Notes (Addendum)
[facility] [place] NAME: Sylvia [name]    MR#:  [id number]  DATE OF BIRTH:  Jan 02, 1956  Subjective:   Patient states her pain is improved today.  She is looking forward to working with physical therapy.  She has no other concerns.  She is asking that we stop the PCA so that she does not have to wear the monitor anymore.  REVIEW OF SYSTEMS:  CONSTITUTIONAL: No fever, fatigue or weakness.  EYES: No blurred or double vision.  EARS, NOSE, AND THROAT: No tinnitus or ear pain.  RESPIRATORY: No cough, shortness of breath, wheezing or hemoptysis.  CARDIOVASCULAR: No chest pain, orthopnea, edema.  GASTROINTESTINAL: No nausea or vomiting. GENITOURINARY: No dysuria, hematuria.  ENDOCRINE: No polyuria, nocturia,  HEMATOLOGY: No bleeding, no bruising MUSCULOSKELETAL:  severe right stump pain  PSYCHIATRY: No anxiety or depression.   DRUG ALLERGIES:   Allergies  Allergen Reactions  . Penicillins Hives    Has patient had a PCN reaction causing immediate rash, facial/tongue/throat swelling, SOB or lightheadedness with hypotension: Yes Has patient had a PCN reaction causing severe rash involving mucus membranes or skin necrosis: No Has patient had a PCN reaction that required hospitalization: No Has patient had a PCN reaction occurring within the last 10 years: No If all of the above answers are "NO", then may proceed with Cephalosporin use.  . Tramadol Itching    VITALS:  Blood pressure (!) 142/60, pulse 96, temperature 98.7 F (37.1 C), temperature source Oral, resp. rate 20, height 5\' 7"  (1.702 m), weight 69.3 kg, SpO2 100 %.  PHYSICAL EXAMINATION:  GENERAL:  63 y.o.-year-old patient lying in the bed with no acute distress.  EYES: Pupils equal, round, reactive to light and accommodation. No scleral icterus. Extraocular muscles intact.  HEENT: Head atraumatic, normocephalic. Oropharynx and nasopharynx clear.  NECK:  Supple, no jugular venous  distention. No thyroid enlargement, no tenderness.  LUNGS: Clear to auscultation with occasional basilar crepitations present CARDIOVASCULAR: MILDLY TACHYCARDIC, REGULAR RHYTHM, S1, S2. No murmurs, rubs, or gallops.  ABDOMEN: Soft, nontender, nondistended. Bowel sounds present. No organomegaly or mass.  EXTREMITIES: S/p right BKA nEUROLOGIC: Cranial nerves II through XII are intact. + Global weakness. Sensation intact. Gait not checked.  PSYCHIATRIC: The patient is alert and oriented x 3.  SKIN: R BKA with dressing in place LABORATORY PANEL:   CBC Recent Labs  Lab 12/30/18 0500  WBC 17.3*  HGB 7.6*  HCT 22.9*  PLT 672*   ------------------------------------------------------------------------------------------------------------------  Chemistries  Recent Labs  Lab 12/25/18 0806  12/30/18 0500  NA 141   < > 142  K 4.4   < > 3.9  CL 111   < > 106  CO2 20*   < > 23  GLUCOSE 104*   < > 122*  BUN 28*   < > 15  CREATININE 1.62*   < > 1.08*  CALCIUM 8.6*   < > 8.6*  MG 2.0   < > 1.6*  AST 117*  --   --   ALT 56*  --   --   ALKPHOS 104  --   --   BILITOT 0.8  --   --    < > = values in this interval not displayed.   ------------------------------------------------------------------------------------------------------------------  Cardiac Enzymes No results for input(s): TROPONINI in the last 168 hours. ------------------------------------------------------------------------------------------------------------------  RADIOLOGY:  No results found.  ASSESSMENT AND PLAN:   Severe PAD- s/p multiple RLE endovascular interventions, now with right  BKA.  Pain is improving. -Stop PCA today -Stop antibiotics -Vascular surgery following -Increase Lipitor to 40mg  daily -Continue Eliquis and aspirin -PT recommending home health  Acute blood loss anemia with a history of chronic normocytic anemia- due to multiple angiograms and BKA. Hemoglobin low but stable. S/p 1 unit PRBC  earlier this admission. -Check anemia panel  Leukocytosis- improved. Likely reactive due to multiple surgeries. No fevers or signs of infection. -Stop antibiotics after today -Recheck CBC in the morning  Hypomagnesemia -Replete and recheck  Hypertension- BP has been elevated over the last 24 hours. Not on any BP meds at home. -Start metoprolol 50mg  bid -Hydralazine IV prn  Uncontrolled type 2 diabetes -Continue home Tradjenta -Continue SSI   Acute renal failure-due to contrast nephropathy.  Creatinine has greatly improved. -Nephrology signing off  Possible discharge home with home health tomorrow.  All the records are reviewed and case discussed with Care Management/Social Workerr. Management plans discussed with the patient, family and they are in agreement.  CODE STATUS: Full  TOTAL TIME TAKING CARE OF THIS PATIENT: 40 minutes.   More than 50% time spent in counseling, coordination of care   POSSIBLE D/C tomorrow, DEPENDING ON CLINICAL CONDITION.   Tyiana Hill M.D on 12/30/2018   Between 7am to 6pm - Pager - [phone]  After 6pm go to [email or web address] - 03/01/2019  [facility]  Office  [phone]  CC: Primary care physician; Center, Social research officer, government [facility]  Note: This dictation was prepared with [phone] dictation along with smaller phrase technology. Any transcriptional errors that result from this process are unintentional.

## 2018-12-30 NOTE — Progress Notes (Signed)
Patient currently refusing PCA pump. Patient states that she will just take oxycodone

## 2018-12-31 LAB — BASIC METABOLIC PANEL
Anion gap: 8 (ref 5–15)
BUN: 15 mg/dL (ref 8–23)
CO2: 25 mmol/L (ref 22–32)
Calcium: 8.4 mg/dL — ABNORMAL LOW (ref 8.9–10.3)
Chloride: 106 mmol/L (ref 98–111)
Creatinine, Ser: 1.05 mg/dL — ABNORMAL HIGH (ref 0.44–1.00)
GFR calc Af Amer: >60 mL/min (ref >60)
GFR calc non Af Amer: 56 mL/min — ABNORMAL LOW (ref >60)
Glucose, Bld: 118 mg/dL — ABNORMAL HIGH (ref 70–99)
Potassium: 3.3 mmol/L — ABNORMAL LOW (ref 3.5–5.1)
Sodium: 139 mmol/L (ref 135–145)

## 2018-12-31 LAB — IRON AND TIBC
Iron: 39 ug/dL (ref 28–170)
Saturation Ratios: 24 % (ref 10.4–31.8)
TIBC: 163 ug/dL — ABNORMAL LOW (ref 250–450)
UIBC: 124 ug/dL

## 2018-12-31 LAB — FERRITIN: Ferritin: 230 ng/mL (ref 11–307)

## 2018-12-31 LAB — MAGNESIUM: Magnesium: 1.8 mg/dL (ref 1.7–2.4)

## 2018-12-31 LAB — PREPARE RBC (CROSSMATCH)

## 2018-12-31 LAB — CBC
HCT: 21.2 % — ABNORMAL LOW (ref 36.0–46.0)
Hemoglobin: 7.2 g/dL — ABNORMAL LOW (ref 12.0–15.0)
MCH: 28.7 pg (ref 26.0–34.0)
MCHC: 34 g/dL (ref 30.0–36.0)
MCV: 84.5 fL (ref 80.0–100.0)
Platelets: 708 10*3/uL — ABNORMAL HIGH (ref 150–400)
RBC: 2.51 MIL/uL — ABNORMAL LOW (ref 3.87–5.11)
RDW: 14.3 % (ref 11.5–15.5)
WBC: 13.4 10*3/uL — ABNORMAL HIGH (ref 4.0–10.5)
nRBC: 0.1 % (ref 0.0–0.2)

## 2018-12-31 LAB — GLUCOSE, CAPILLARY
Glucose-Capillary: 120 mg/dL — ABNORMAL HIGH (ref 70–99)
Glucose-Capillary: 157 mg/dL — ABNORMAL HIGH (ref 70–99)
Glucose-Capillary: 108 mg/dL — ABNORMAL HIGH (ref 70–99)
Glucose-Capillary: 216 mg/dL — ABNORMAL HIGH (ref 70–99)

## 2018-12-31 LAB — HEMOGLOBIN AND HEMATOCRIT, BLOOD
HCT: 25.3 % — ABNORMAL LOW (ref 36.0–46.0)
Hemoglobin: 8.7 g/dL — ABNORMAL LOW (ref 12.0–15.0)

## 2018-12-31 LAB — FOLATE: Folate: 22.7 ng/mL (ref >5.9)

## 2018-12-31 LAB — OCCULT BLOOD X 1 CARD TO LAB, STOOL: Fecal Occult Bld: POSITIVE — AB

## 2018-12-31 LAB — VITAMIN B12: Vitamin B-12: 561 pg/mL (ref 180–914)

## 2018-12-31 MED ORDER — SODIUM CHLORIDE 0.9% IV SOLUTION
Freq: Once | INTRAVENOUS | Status: AC
  Administered 2018-12-31: 13:00:00 via INTRAVENOUS

## 2018-12-31 MED ORDER — MAGNESIUM SULFATE 2 GM/50ML IV SOLN
2.0000 g | Freq: Once | INTRAVENOUS | Status: AC
  Administered 2018-12-31: 2 g via INTRAVENOUS
  Filled 2018-12-31: qty 50

## 2018-12-31 NOTE — Progress Notes (Signed)
Physical Therapy Treatment Patient Details Name: Sylvia [name] MRN: [id number] DOB: 1956-03-17 Today's Date: 12/31/2018    History of Present Illness Pt is a 63 y.o. female presenting to hospital 12/14/18 with abdominal pain, nausea, vomiting, and diarrhea. 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  S/p angio 8/31 R LE d/t PAD with resting pain B LE's.  PMH includes htn, DM, PAD, anxiety, COPD, h/o DVT.    PT Comments    Pt in bed upon entry, agreeable to participate. Pt does well with bed level exercises, tolerates all without need for physical assist. Knee flexion is limited by surgical dressing/tegaderm that goes up the distal 3rd of thigh, hence difficult to appraise actual ROM in knee. Standing mobility is progressed from SPT 1DA, to AMB ~43ft this date, despite H&H trending low (7.2/21.2) pt only c/o feeling flush, and with noted tachycardia 135bpm after AMB. Pt progressing slowly and well, but suspect ABLA is a limiting factor in performance.    Follow Up Recommendations  Home health PT;Supervision/Assistance - 24 hour     Equipment Recommendations  Rolling walker with 5" wheels;3in1 (PT);Wheelchair (measurements PT);Wheelchair cushion (measurements PT)    Recommendations for Other Services       Precautions / Restrictions Precautions Precautions: Fall Precaution Comments: Rt BKA Restrictions RLE Weight Bearing: Non weight bearing    Mobility  Bed Mobility Overal bed mobility: Modified Independent                Transfers Overall transfer level: Needs assistance Equipment used: Rolling walker (2 wheeled) Transfers: Sit to/from Stand Sit to Stand: Min guard         General transfer comment: good technique, no cues needed, author stabilizes RW for safety and observes pt for signs of  ABLA/presyncope  Ambulation/Gait Ambulation/Gait assistance: Min guard Gait Distance (Feet): 34 Feet Assistive device: Rolling walker (2 wheeled)       General Gait Details: Left leg hop-to gait with RLE floating/NWB. slow adn steady, strong BUE, no gait instability noted. Feels "flush" upon return to chair, HR 135bpm, denies dizziness.   Stairs             Wheelchair Mobility    Modified Rankin (Stroke Patients Only)       Balance                                            Cognition Arousal/Alertness: Awake/alert Behavior During Therapy: WFL for tasks assessed/performed Overall Cognitive Status: Within Functional Limits for tasks assessed                                 General Comments: eager to participate with physical therapy      Exercises General Exercises - Lower Extremity Short Arc Quad: AROM;Left;15 reps;Supine Amputee Exercises Gluteal Sets: AROM;Right;20 reps;Supine Hip ABduction/ADduction: AROM;Right;15 reps;Supine Knee Flexion: AROM;Right;Supine;15 reps Knee Extension: AROM;Right;15 reps;Supine Straight Leg Raises: AROM;Right;10 reps;Supine    General Comments        Pertinent Vitals/Pain Pain Assessment: ("moderate") Pain Location: residual limb Pain Descriptors / Indicators: Aching;Constant;Discomfort Pain Intervention(s): Limited activity within patient's tolerance;Monitored during session;Repositioned    Home Living  Prior Function            PT Goals (current goals can now be found in the care plan section) Acute Rehab PT Goals Patient Stated Goal: to go home and take care of myself again PT Goal Formulation: With patient Time For Goal Achievement: 01/13/19 Potential to Achieve Goals: Fair Progress towards PT goals: Progressing toward goals    Frequency    7X/week      PT Plan Current plan remains appropriate    Co-evaluation              AM-PAC  PT "6 Clicks" Mobility   Outcome Measure  Help needed turning from your back to your side while in a flat bed without using bedrails?: A Little Help needed moving from lying on your back to sitting on the side of a flat bed without using bedrails?: A Little Help needed moving to and from a bed to a chair (including a wheelchair)?: A Little Help needed standing up from a chair using your arms (e.g., wheelchair or bedside chair)?: A Little Help needed to walk in hospital room?: A Lot Help needed climbing 3-5 steps with a railing? : A Lot 6 Click Score: 16    End of Session Equipment Utilized During Treatment: Gait belt Activity Tolerance: Patient tolerated treatment well;Treatment limited secondary to medical complications (Comment)(tachycardia while AMB and feeling "flush")   Nurse Communication: Mobility status;Precautions;Other (comment) PT Visit Diagnosis: Other abnormalities of gait and mobility (R26.89);Muscle weakness (generalized) (M62.81);Difficulty in walking, not elsewhere classified (R26.2);Pain;Unsteadiness on feet (R26.81) Pain - Right/Left: Right Pain - part of body: Leg     Time: 7062-3762 PT Time Calculation (min) (ACUTE ONLY): 23 min  Charges:  $Gait Training: 8-22 mins $Therapeutic Exercise: 8-22 mins                     10:38 AM, 12/31/18 Rosamaria Lints, PT, DPT Physical Therapist - [facility]  [phone] (ASCOM)    Rimas Gilham C 12/31/2018, 10:35 AM

## 2018-12-31 NOTE — Progress Notes (Signed)
[employer] - [place] at [facility]   PATIENT NAME: Sylvia [name]    MR#:  [id number]  DATE OF BIRTH:  Nov 10, 1955  Subjective:   Patient states she is feeling well today. Her pain is well-controlled with the pain medicines. She walked with PT this morning and became tachycardic and winded. She otherwise felt fine.  REVIEW OF SYSTEMS:  CONSTITUTIONAL: No fever, fatigue or weakness.  EYES: No blurred or double vision.  EARS, NOSE, AND THROAT: No tinnitus or ear pain.  RESPIRATORY: No cough, shortness of breath, wheezing or hemoptysis.  CARDIOVASCULAR: No chest pain, orthopnea, edema.  GASTROINTESTINAL: No nausea or vomiting. GENITOURINARY: No dysuria, hematuria.  ENDOCRINE: No polyuria, nocturia,  HEMATOLOGY: No bleeding, no bruising MUSCULOSKELETAL:  severe right stump pain  PSYCHIATRY: No anxiety or depression.   DRUG ALLERGIES:   Allergies  Allergen Reactions  . Penicillins Hives    Has patient had a PCN reaction causing immediate rash, facial/tongue/throat swelling, SOB or lightheadedness with hypotension: Yes Has patient had a PCN reaction causing severe rash involving mucus membranes or skin necrosis: No Has patient had a PCN reaction that required hospitalization: No Has patient had a PCN reaction occurring within the last 10 years: No If all of the above answers are "NO", then may proceed with Cephalosporin use.  . Tramadol Itching    VITALS:  Blood pressure (!) 157/82, pulse (!) 112, temperature 98.2 F (36.8 C), resp. rate 18, height 5\' 7"  (1.702 m), weight 69.3 kg, SpO2 97 %.  PHYSICAL EXAMINATION:  GENERAL:  63 y.o.-year-old patient lying in the bed with no acute distress.  EYES: Pupils equal, round, reactive to light and accommodation. No scleral icterus. Extraocular muscles intact.  HEENT: Head atraumatic, normocephalic. Oropharynx and nasopharynx clear.  NECK:  Supple, no jugular venous distention. No thyroid enlargement, no tenderness.   LUNGS: Clear to auscultation with occasional basilar crepitations present CARDIOVASCULAR: RRR, S1, S2. No murmurs, rubs, or gallops.  ABDOMEN: Soft, nontender, nondistended. Bowel sounds present. No organomegaly or mass.  EXTREMITIES: S/p right BKA nEUROLOGIC: Cranial nerves II through XII are intact. + Global weakness. Sensation intact. Gait not checked.  PSYCHIATRIC: The patient is alert and oriented x 3.  SKIN: R BKA with dressing in place LABORATORY PANEL:   CBC Recent Labs  Lab 12/31/18 0427  WBC 13.4*  HGB 7.2*  HCT 21.2*  PLT 708*   ------------------------------------------------------------------------------------------------------------------  Chemistries  Recent Labs  Lab 12/25/18 0806  12/31/18 0427  NA 141   < > 139  K 4.4   < > 3.3*  CL 111   < > 106  CO2 20*   < > 25  GLUCOSE 104*   < > 118*  BUN 28*   < > 15  CREATININE 1.62*   < > 1.05*  CALCIUM 8.6*   < > 8.4*  MG 2.0   < > 1.8  AST 117*  --   --   ALT 56*  --   --   ALKPHOS 104  --   --   BILITOT 0.8  --   --    < > = values in this interval not displayed.   ------------------------------------------------------------------------------------------------------------------  Cardiac Enzymes No results for input(s): TROPONINI in the last 168 hours. ------------------------------------------------------------------------------------------------------------------  RADIOLOGY:  No results found.  ASSESSMENT AND PLAN:   Severe PAD- s/p multiple RLE endovascular interventions, now with right BKA.  Pain is improving. -Vascular surgery following -Continue Eliquis, aspirin, liptor -PT recommending home health -Pain control-  doing well off PCA  Acute blood loss anemia with a history of chronic normocytic anemia- due to multiple angiograms and BKA. Hemoglobin remains low this morning. S/p 3 units PRBC so far this admission. -Anemia panel unremarkable -Will give 1 unit PRBC today, given patient's  persistent tachycardia -FOBT ordered -Will continue eliquis for now, may need to stop if Hgb continues to drop  Leukocytosis- improving. Likely reactive due to multiple surgeries. No fevers or signs of infection. -Monitor  Hypokalemia/hypomagnesemia -Replete and recheck  Hypertension- BP has been elevated over the last 24 hours. Not on any BP meds at home. -Continue metoprolol 50mg  bid -Hydralazine IV prn  Uncontrolled type 2 diabetes -Continue home Tradjenta -Continue SSI   Acute renal failure-due to contrast nephropathy.  Creatinine has greatly improved. -Monitor  Possible discharge home with home health tomorrow if hemoglobin stabilizes  All the records are reviewed and case discussed with Care Management/Social Workerr. Management plans discussed with the patient, family and they are in agreement.  CODE STATUS: Full  TOTAL TIME TAKING CARE OF THIS PATIENT: 38 minutes.   More than 50% time spent in counseling, coordination of care   POSSIBLE D/C tomorrow, DEPENDING ON CLINICAL CONDITION.   Berna Spare Cung Masterson M.D on 12/31/2018   Between 7am to 6pm - Pager - [phone]  After 6pm go to [email or web address] - password [email or web address]  Office  [phone]  CC: Primary care physician; [facility] [place]  Note: This dictation was prepared with Diplomatic Services operational officer dictation along with smaller phrase technology. Any transcriptional errors that result from this process are unintentional.

## 2018-12-31 NOTE — Plan of Care (Signed)
Patient ambulated in room and had BM.  Mew changed to yellow since HR in 130s.  Patient asymptomatic and recovered on own, but still has HR of 112 1 hour later.  Dr. Text to inform

## 2019-01-01 LAB — BASIC METABOLIC PANEL
Anion gap: 7 (ref 5–15)
BUN: 16 mg/dL (ref 8–23)
CO2: 26 mmol/L (ref 22–32)
Calcium: 8.5 mg/dL — ABNORMAL LOW (ref 8.9–10.3)
Chloride: 104 mmol/L (ref 98–111)
Creatinine, Ser: 0.96 mg/dL (ref 0.44–1.00)
GFR calc Af Amer: >60 mL/min (ref >60)
GFR calc non Af Amer: >60 mL/min (ref >60)
Glucose, Bld: 106 mg/dL — ABNORMAL HIGH (ref 70–99)
Potassium: 3.6 mmol/L (ref 3.5–5.1)
Sodium: 137 mmol/L (ref 135–145)

## 2019-01-01 LAB — CBC
HCT: 24.2 % — ABNORMAL LOW (ref 36.0–46.0)
Hemoglobin: 8.2 g/dL — ABNORMAL LOW (ref 12.0–15.0)
MCH: 28.2 pg (ref 26.0–34.0)
MCHC: 33.9 g/dL (ref 30.0–36.0)
MCV: 83.2 fL (ref 80.0–100.0)
Platelets: 676 10*3/uL — ABNORMAL HIGH (ref 150–400)
RBC: 2.91 MIL/uL — ABNORMAL LOW (ref 3.87–5.11)
RDW: 14.2 % (ref 11.5–15.5)
WBC: 15.3 10*3/uL — ABNORMAL HIGH (ref 4.0–10.5)
nRBC: 0.1 % (ref 0.0–0.2)

## 2019-01-01 LAB — BPAM RBC
Blood Product Expiration Date: 202010132359
ISSUE DATE / TIME: 202009131447
Unit Type and Rh: 5100

## 2019-01-01 LAB — GLUCOSE, CAPILLARY
Glucose-Capillary: 119 mg/dL — ABNORMAL HIGH (ref 70–99)
Glucose-Capillary: 150 mg/dL — ABNORMAL HIGH (ref 70–99)
Glucose-Capillary: 125 mg/dL — ABNORMAL HIGH (ref 70–99)
Glucose-Capillary: 136 mg/dL — ABNORMAL HIGH (ref 70–99)

## 2019-01-01 LAB — TYPE AND SCREEN
ABO/RH(D): O POS
Antibody Screen: NEGATIVE
Unit division: 0

## 2019-01-01 MED ORDER — LISINOPRIL 5 MG PO TABS
5.0000 mg | ORAL_TABLET | Freq: Every day | ORAL | Status: DC
  Administered 2019-01-01 – 2019-01-06 (×6): 5 mg via ORAL
  Filled 2019-01-01 (×8): qty 1

## 2019-01-01 MED ORDER — PANTOPRAZOLE SODIUM 40 MG IV SOLR
40.0000 mg | Freq: Two times a day (BID) | INTRAVENOUS | Status: DC
  Administered 2019-01-01 – 2019-01-04 (×7): 40 mg via INTRAVENOUS
  Filled 2019-01-01 (×7): qty 40

## 2019-01-01 NOTE — Progress Notes (Signed)
Gilmer did a f/u visit with pt that had been transferred to floor care status. Pt had a positive affect upon ch arrival and had sister at chair-side. Pt was alert and sitting upright in recliner with moderate pain upon movement of amputated leg/foot. Pt still presents to be motivated to participate in rehab but shared that she will need to be seen by GI related to the blood in her stool. Pt is hopeful about the outcome. Pt self-weaned from morphine for pain due to the side effects that she was experiencing. Ch offered words of comfort to pt and sister. Pt and sister were appreciative of visit.     01/01/19 1200  Clinical Encounter Type  Visited With Patient and family together  Visit Type Follow-up  Spiritual Encounters  Spiritual Needs Emotional;Grief support  Stress Factors  Patient Stress Factors Health changes;Major life changes

## 2019-01-01 NOTE — Progress Notes (Signed)
[facility] Daily Progress Note   Subjective: 12/27/18: Right Below The Knee Amputation  12/19/18: 1.Ultrasound guidance for vascular accessleftfemoral artery 2.Catheter placement into right common femoral artery from left femoral approach 3.Aortogram and selectiverightlower extremity angiogram 4.Mechanical thrombectomy with penumbra CAT 6 device to the right SFA, popliteal artery, tibioperoneal trunk, and proximal posterior tibial arteries 5.Percutaneous transluminal angioplasty of the right tibioperoneal trunk and posterior tibial artery with 3 mm diameter by 22 cm length angioplasty balloon 6.Percutaneous transluminal angioplasty of the right popliteal artery with 4 mm diameter by 22 cm length [name] 7.Percutaneous transluminal angioplasty of the right SFA with 5 mm diameter by 30 cm length [name] 8.Placement of 8 mg of TPA to the right SFA, popliteal artery, and tibioperoneal trunk as well as placement of an infusion catheter for continuous thrombolytic therapy following the procedure using a 130 cm total length 50 cm working length lysis catheter  With take back to endovascular suite the same day for:  1.RLE angiogram 2.Catheter placement into right peroneal artery from left femoral approach 3.Mechanical thrombectomy to the right SFA, popliteal artery, tibioperoneal trunk, and peroneal artery 4.Percutaneous transluminal angioplasty ofthe right peroneal artery and tibioperoneal trunk with 2.5 mm diameter angioplasty balloon 5.Percutaneous transluminal angioplasty of the right popliteal artery with 4 mm diameter [name] 6.Percutaneous transluminal angioplasty of the  right proximal SFA with 5 mm diameter [name] 7.Viabahnstent placement to the right popliteal artery with 5 mm diameter by 10 cm length stent 8.StarClose closure device leftfemoral artery  Improved right lower extremity stump pain.   Objective: Vitals:   12/31/18 1736 12/31/18 1800 12/31/18 1937 01/01/19 0538  BP: (!) 167/69 (!) 152/70 (!) 145/73 (!) 177/79  Pulse: 88 92 85 89  Resp:   18 16  Temp: 98.7 F (37.1 C) 98.7 F (37.1 C) 98.7 F (37.1 C) 98.5 F (36.9 C)  TempSrc: Oral Oral Oral Oral  SpO2: 100% 100% 99% 99%  Weight:      Height:        Intake/Output Summary (Last 24 hours) at 01/01/2019 1140 Last data filed at 01/01/2019 1005 Gross per 24 hour  Intake 1960 ml  Output -  Net 1960 ml   Physical Exam: A&Ox3, NAD CV: RRR Pulmonary: CTA Bilaterally Abdomen: Soft, Nontender, Nondistended Vascular:  Right Lower Extremity Thigh soft. Knee flexible at the joint. OR dressing removed. OR dressing dry - minimal drainage. Stump healthy. Staple line clean and dry.    Laboratory: CBC    Component Value Date/Time   WBC 15.3 (H) 01/01/2019 0345   HGB 8.2 (L) 01/01/2019 0345   HCT 24.2 (L) 01/01/2019 0345   PLT 676 (H) 01/01/2019 0345   BMET    Component Value Date/Time   NA 137 01/01/2019 0345   K 3.6 01/01/2019 0345   CL 104 01/01/2019 0345   CO2 26 01/01/2019 0345   GLUCOSE 106 (H) 01/01/2019 0345   BUN 16 01/01/2019 0345   CREATININE 0.96 01/01/2019 0345   CALCIUM 8.5 (L) 01/01/2019 0345   GFRNONAA >60 01/01/2019 0345   GFRAA >60 01/01/2019 0345   Assessment/Planning: The patient is a 63 year old female with multiple medical issues including peripheral artery disease requiring multiple endovascular interventions now iss/p R BKA 1) OR dressing removed. Stump healthy, clean and dry 2) OK to discharge from vascular standpoint when patient is medically stable.  Discussed with Dr. Ellis Parents  Saanvi Hakala PA-C 01/01/2019 11:40 AM

## 2019-01-01 NOTE — Consult Note (Signed)
Wyline Mood , MD [address], [place], [place], [address] [address], [place], [place], [address] Phone: [phone]  Fax: [phone]  Consultation  Referring Provider:   Dr Nancy Marus   Primary Care Physician:  [facility] Primary Gastroenterologist:  Dr. Norma Fredrickson          Reason for Consultation:     GI bleed   Date of Admission:  12/14/2018 Date of Consultation:  01/01/2019         HPI:   Sylvia [name] is a 63 y.o. female was admitted on 12/14/2018 for abdominal pain. History of DVT on Coumadin.  On admission was found to have DKA.  She has a history of peripheral arterial disease on Eliquis and aspirin.  She has had 3 units of blood transfusion this admission.  I have been consulted for anemia.  She had undergone an upper endoscopy in February 2020 by Dr. Norma Fredrickson for epigastric pain nausea and vomiting.  Found to have inflammation of the gastric antrum and one nonbleeding duodenal ulcer.  Pathology demonstrated chronic active gastritis with ulceration and H. pylori organisms.   Her baseline hemoglobin since 6 months has been around 10.8 g and MCV of 83 and been around 8.5 g in May 2020.  This admission 2 weeks back hemoglobin was around 9.1 g.  And it gradually trended down towards the mid 7 g and this is despite transfusion.  1 day back her hemoglobin was 7.2 g with a platelet count of 708.  Ferritin and iron studies checked yesterday probably after transfusion of in the normal range.  The Eliquis has been stopped.  She states that she is been having black-colored tarry stools for the past few weeks.  Denies any abdominal pain.  Denies any hematemesis.  Denies any NSAID use.  Last dose of Eliquis was yesterday.  No other complaints.   Past Medical History:  Diagnosis Date  . Anxiety    h/o  . COPD (chronic obstructive pulmonary disease) (HCC)   . Diabetes mellitus without complication (HCC)   . GERD (gastroesophageal reflux  disease)   . Hypertension    bp under control-off meds since 2019    Past Surgical History:  Procedure Laterality Date  . ABDOMINAL HYSTERECTOMY    . AMPUTATION Right 12/27/2018   Procedure: AMPUTATION BELOW KNEE;  Surgeon: Annice Needy, MD;  Location: [facility] ORS;  Service: General;  Laterality: Right;  . CHOLECYSTECTOMY    . ESOPHAGOGASTRODUODENOSCOPY (EGD) WITH PROPOFOL N/A 06/15/2018   Procedure: ESOPHAGOGASTRODUODENOSCOPY (EGD) WITH PROPOFOL;  Surgeon: Toledo, Boykin Nearing, MD;  Location: [facility] ENDOSCOPY;  Service: Gastroenterology;  Laterality: N/A;  . LOWER EXTREMITY ANGIOGRAPHY Right 08/21/2018   Procedure: LOWER EXTREMITY ANGIOGRAPHY;  Surgeon: Annice Needy, MD;  Location: [facility] INVASIVE CV LAB;  Service: Cardiovascular;  Laterality: Right;  . LOWER EXTREMITY ANGIOGRAPHY Left 08/28/2018   Procedure: LOWER EXTREMITY ANGIOGRAPHY;  Surgeon: Annice Needy, MD;  Location: [facility]AS[facility] LAB;  Service: Cardiovascular;  Laterality: Left;  . LOWER EXTREMITY ANGIOGRAPHY Right 08/28/2018   Procedure: Lower Extremity Angiography;  Surgeon: Annice Needy, MD;  Location: [facility]AS[facility] CV LAB;  Service: Cardiovascular;  Laterality: Right;  . LOWER EXTREMITY ANGIOGRAPHY Right 12/18/2018   Procedure: Lower Extremity Angiography;  Surgeon: Annice Needy, MD;  Location: [facility];  Service: Cardiovascular;  Laterality: Right;  . LOWER EXTREMITY ANGIOGRAPHY Right 12/18/2018   Procedure: Lower Extremity Angiography;  Surgeon: Annice Needy, MD;  Location: [place] [facility];  Service: Cardiovascular;  Laterality: Right;  . LOWER EXTREMITY ANGIOGRAPHY Left 12/21/2018   Procedure: Lower Extremity Angiography;  Surgeon: Algernon Huxley, MD;  Location: [place] [facility];  Service: Cardiovascular;  Laterality: Left;  . LOWER EXTREMITY ANGIOGRAPHY Right 12/21/2018   Procedure: Lower Extremity Angiography;  Surgeon: Algernon Huxley, MD;  Location: [place] [facility];  Service: Cardiovascular;  Laterality: Right;  .  LOWER EXTREMITY INTERVENTION N/A 12/22/2018   Procedure: LOWER EXTREMITY INTERVENTION;  Surgeon: Algernon Huxley, MD;  Location: [place] [facility];  Service: Cardiovascular;  Laterality: N/A;    Prior to Admission medications   Medication Sig Start Date End Date Taking? Authorizing Provider  albuterol (PROVENTIL HFA;VENTOLIN HFA) 108 (90 Base) MCG/ACT inhaler Inhale 2 puffs into the lungs every 6 (six) hours as needed for wheezing or shortness of breath. 08/20/17  Yes Schuyler Amor, MD  alum & mag hydroxide-simeth (MAALOX/MYLANTA) 200-200-20 MG/5ML suspension Take 30 mLs by mouth every 4 (four) hours as needed for indigestion or heartburn. 06/16/18  Yes Ojie, Jude, MD  atorvastatin (LIPITOR) 10 MG tablet Take 1 tablet (10 mg total) by mouth daily. 08/21/18 08/21/19 Yes Stegmayer, Janalyn Harder, PA-C  Biotin 1000 MCG tablet Take 1,000 tablets by mouth daily.    Yes [provider]  cetirizine (ZYRTEC) 10 MG tablet Take 10 mg by mouth daily.   Yes [provider]  cholecalciferol (VITAMIN D3) 25 MCG (1000 UT) tablet Take 1,000 Units by mouth daily.   Yes [provider]  iron polysaccharides (NIFEREX) 150 MG capsule Take 1 capsule (150 mg total) by mouth daily. 08/30/18  Yes Dew, Erskine Squibb, MD  Multiple Vitamins-Minerals (WOMENS MULTIVITAMIN PO) Take 1 tablet by mouth daily.   Yes [provider]  pantoprazole (PROTONIX) 40 MG tablet Take 1 tablet (40 mg total) by mouth daily. 06/17/18  Yes Ojie, Jude, MD  sitaGLIPtin (JANUVIA) 100 MG tablet Take 100 mg by mouth daily.   Yes [provider]  vitamin C (ASCORBIC ACID) 500 MG tablet Take 500 mg by mouth daily.   Yes [provider]  warfarin (COUMADIN) 5 MG tablet Take 1 tablet (5 mg total) by mouth daily at 6 PM. 10/09/18 04/07/19 Yes Kris Hartmann, NP    Family History  Problem Relation Age of Onset  . Breast cancer Mother 15     Social History   Tobacco Use  . Smoking status: Former Smoker     Packs/day: 0.25    Years: 45.00    Pack years: 11.25    Types: Cigarettes    Quit date: 08/28/2018    Years since quitting: 0.3  . Smokeless tobacco: Never Used  . Tobacco comment: quit  Substance Use Topics  . Alcohol use: No    Frequency: Never  . Drug use: Never    Allergies as of 12/14/2018 - Review Complete 12/14/2018  Allergen Reaction Noted  . Penicillins Hives 11/14/2015  . Tramadol Itching 12/14/2018    Review of Systems:    All systems reviewed and negative except where noted in HPI.   Physical Exam:  Vital signs in last 24 hours: Temp:  [98.2 F (36.8 C)-98.9 F (37.2 C)] 98.5 F (36.9 C) (09/14 0538) Pulse Rate:  [72-135] 89 (09/14 0538) Resp:  [16-20] 16 (09/14 0538) BP: (145-177)/(69-88) 177/79 (09/14 0538) SpO2:  [95 %-100 %] 99 % (09/14 0538) Last BM Date: 12/31/18 General:   Pleasant, cooperative in NAD Head:  Normocephalic and atraumatic. Eyes:   No icterus.   Conjunctiva pink. PERRLA. Ears:  Normal auditory acuity. Neck:  Supple; no masses or thyroidomegaly Lungs: Respirations even and unlabored. Lungs clear to auscultation bilaterally.   No wheezes, crackles, or rhonchi.  Heart:  Regular rate and rhythm;  Without murmur, clicks, rubs or gallops Abdomen:  Soft, nondistended, nontender. Normal bowel sounds. No appreciable masses or hepatomegaly.  No rebound or guarding.  Neurologic:  Alert and oriented x3; right below-knee amputation Skin:  Intact without significant lesions or rashes. Cervical Nodes:  No significant cervical adenopathy. Psych:  Alert and cooperative. Normal affect.  LAB RESULTS: Recent Labs    12/30/18 0500 12/31/18 0427 12/31/18 2012 01/01/19 0345  WBC 17.3* 13.4*  --  15.3*  HGB 7.6* 7.2* 8.7* 8.2*  HCT 22.9* 21.2* 25.3* 24.2*  PLT 672* 708*  --  676*   BMET Recent Labs    12/30/18 0500 12/31/18 0427 01/01/19 0345  NA 142 139 137  K 3.9 3.3* 3.6  CL 106 106 104  CO2 23 25 26   GLUCOSE 122* 118* 106*  BUN 15 15  16   CREATININE 1.08* 1.05* 0.96  CALCIUM 8.6* 8.4* 8.5*   LFT No results for input(s): PROT, ALBUMIN, AST, ALT, ALKPHOS, BILITOT, BILIDIR, IBILI in the last 72 hours. PT/INR No results for input(s): LABPROT, INR in the last 72 hours.  STUDIES: No results found.    Impression / Plan:   Sylvia [name] is a 63 y.o. 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  I have been consulted today for gradual drop in hemoglobin with melena noted for the past few weeks..  She has had blood transfusion despite which the hemoglobin has been in the lower range.  Plan 1.  Hold off Eliquis when she is off Eliquis for 2 days we will plan to perform an upper endoscopy day after tomorrow.  If negative may even need a colonoscopy and consideration of a CT scan of the abdomen if colonoscopy is negative to rule out intra-abdominal bleed  2.  Continue PPI, avoid NSAIDs, check H. pylori stool antigen.  I have discussed alternative options, risks & benefits,  which include, but are not limited to, bleeding, infection, perforation,respiratory complication & drug reaction.  The patient agrees with this plan & written consent will be obtained.     Thank you for involving me in the care of this patient.      LOS: 18 days   Wyline Mood, MD  01/01/2019, 9:28 AM

## 2019-01-01 NOTE — Progress Notes (Signed)
PT Cancellation Note  Patient Details Name: Sylvia [name] MRN: [id number] DOB: [birth year]   Cancelled Treatment:    Reason Eval/Treat Not Completed: Other (comment).  Pt currently busy with nursing care and not available for PT session.  Will re-attempt PT treatment session at a later date/time.  Leitha Bleak, PT 01/01/19, 9:42 AM [phone]

## 2019-01-01 NOTE — Progress Notes (Signed)
[employer] - [place] at [facility]   PATIENT NAME: Sylvia [name]    MR#:  [id number]  DATE OF BIRTH:  Nov 14, 1955  Subjective:   Patient states she is feeling fine today, although she notes she is a little bit tired. She denies any hematochezia, melena, or abdominal pain.  REVIEW OF SYSTEMS:  CONSTITUTIONAL: No fever, fatigue or weakness.  EYES: No blurred or double vision.  EARS, NOSE, AND THROAT: No tinnitus or ear pain.  RESPIRATORY: No cough, shortness of breath, wheezing or hemoptysis.  CARDIOVASCULAR: No chest pain, orthopnea, edema.  GASTROINTESTINAL: No nausea or vomiting. GENITOURINARY: No dysuria, hematuria.  ENDOCRINE: No polyuria, nocturia,  HEMATOLOGY: No bleeding, no bruising MUSCULOSKELETAL:  severe right stump pain  PSYCHIATRY: No anxiety or depression.   DRUG ALLERGIES:   Allergies  Allergen Reactions  . Penicillins Hives    Has patient had a PCN reaction causing immediate rash, facial/tongue/throat swelling, SOB or lightheadedness with hypotension: Yes Has patient had a PCN reaction causing severe rash involving mucus membranes or skin necrosis: No Has patient had a PCN reaction that required hospitalization: No Has patient had a PCN reaction occurring within the last 10 years: No If all of the above answers are "NO", then may proceed with Cephalosporin use.  . Tramadol Itching    VITALS:  Blood pressure (!) 177/79, pulse 89, temperature 98.5 F (36.9 C), temperature source Oral, resp. rate 16, height 5\' 7"  (1.702 m), weight 69.3 kg, SpO2 99 %.  PHYSICAL EXAMINATION:  GENERAL:  63 y.o.-year-old patient lying in the bed with no acute distress.  EYES: Pupils equal, round, reactive to light and accommodation. No scleral icterus. Extraocular muscles intact.  HEENT: Head atraumatic, normocephalic. Oropharynx and nasopharynx clear.  NECK:  Supple, no jugular venous distention. No thyroid enlargement, no tenderness.  LUNGS: Clear to  auscultation with occasional basilar crepitations present CARDIOVASCULAR: RRR, S1, S2. No murmurs, rubs, or gallops.  ABDOMEN: Soft, nontender, nondistended. Bowel sounds present. No organomegaly or mass.  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  nEUROLOGIC: Cranial nerves II through XII are intact. + Global weakness. Sensation intact. Gait not checked.  PSYCHIATRIC: The patient is alert and oriented x 3.  SKIN: R BKA with dressing in place LABORATORY PANEL:   CBC Recent Labs  Lab 01/01/19 0345  WBC 15.3*  HGB 8.2*  HCT 24.2*  PLT 676*   ------------------------------------------------------------------------------------------------------------------  Chemistries  Recent Labs  Lab 12/31/18 0427 01/01/19 0345  NA 139 137  K 3.3* 3.6  CL 106 104  CO2 25 26  GLUCOSE 118* 106*  BUN 15 16  CREATININE 1.05* 0.96  CALCIUM 8.4* 8.5*  MG 1.8  --    ------------------------------------------------------------------------------------------------------------------  Cardiac Enzymes No results for input(s): TROPONINI in the last 168 hours. ------------------------------------------------------------------------------------------------------------------  RADIOLOGY:  No results found.  ASSESSMENT AND PLAN:   Severe PAD- s/p multiple RLE endovascular interventions, now with right BKA.  Pain is improving. -Vascular surgery following -Continue aspirin and liptor -Holding Eliquis this morning due to GI bleed -PT recommending home health  Possible GI bleed with a history of chronic normocytic anemia- has had some acute blood loss anemia this hospitalization, initially felt to be due to multiple angiograms and BKA. Hemoglobin continues to drop. S/p 4 units PRBC so far this admission. Last EGD 05/2018 with gastritis and non-bleeding ulcer. -FOBT was positive yesterday -Anemia panel unremarkable -GI has been consulted -Start IV protonix -Stop eliquis for now, will need  to restart as  soon as able  Leukocytosis- WBC bumped from yesterday to today. Likely reactive. No fevers or signs of infection. Stump site looks good per vascular. -Monitor  Hypertension- BP continues to be elevated -Continue metoprolol 50mg  bid -Start lisionpril 5mg  daily -Hydralazine IV prn  Uncontrolled type 2 diabetes -Continue home Tradjenta -Continue SSI   All the records are reviewed and case discussed with Care Management/Social Workerr. Management plans discussed with the patient, family and they are in agreement.  CODE STATUS: Full  TOTAL TIME TAKING CARE OF THIS PATIENT: 42 minutes.   More than 50% time spent in counseling, coordination of care   POSSIBLE D/C tomorrow, DEPENDING ON CLINICAL CONDITION.   Berna Spare Mackie Holness M.D on 01/01/2019   Between 7am to 6pm - Pager - [phone]  After 6pm go to [email or web address] - password [email or web address] [place] [facility]  Office  [phone]  CC: Primary care physician; Center, [place]  Note: This dictation was prepared with Diplomatic Services operational officer dictation along with smaller phrase technology. Any transcriptional errors that result from this process are unintentional.

## 2019-01-01 NOTE — Progress Notes (Signed)
Physical Therapy Treatment Patient Details Name: Toyia Jelinek MRN: [id number] DOB: 1955-06-26 Today's Date: 01/01/2019    History of Present Illness Pt is a 63 y.o. female presenting to hospital 12/14/18 with abdominal pain, nausea, vomiting, and diarrhea. 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  S/p angio 8/31 R LE d/t PAD with resting pain B LE's.  PMH includes htn, DM, PAD, anxiety, COPD, h/o DVT.    PT Comments    Pt reporting increased R LE residual limb pain after dressing change this morning (8/10 pain) but pt agreeable to PT session (PT discussed coming back later d/t pt's elevated pain but pt requesting to try session now instead).  Pt CGA with transfers with RW and ambulation 15 feet and then 10 feet with RW (limited distance ambulating to 15 feet d/t pt starting to feel "hot" and "sweaty"--pt's HR 100 bpm so pt sat down to rest and then toileted on BSC and able to ambulate back to recliner).  Overall pt steady with short distances ambulating with RW but would recommend manual w/c use for longer distances.  Will continue to focus on strengthening, R knee ROM, and progressive functional mobility per pt tolerance.    Follow Up Recommendations  Home health PT;Supervision/Assistance - 24 hour     Equipment Recommendations  Rolling walker with 5" wheels;3in1 (PT);Wheelchair (measurements PT);Wheelchair cushion (measurements PT)    Recommendations for Other Services       Precautions / Restrictions Precautions Precautions: Fall Precaution Comments: Rt BKA Restrictions Weight Bearing Restrictions: Yes RLE Weight Bearing: Non weight bearing    Mobility  Bed Mobility               General bed mobility comments: Deferred (pt sitting in recliner beginning/end of session)  Transfers Overall transfer level: Needs assistance Equipment used:  Rolling walker (2 wheeled) Transfers: Sit to/from [employer] Sit to Stand: Min guard Stand pivot transfers: Min guard       General transfer comment: mild increased effort to stand from bed and BSC but good hand positioning  Ambulation/Gait Ambulation/Gait assistance: Min guard Gait Distance (Feet): (15 feet; 10 feet) Assistive device: Rolling walker (2 wheeled)   Gait velocity: decreased   General Gait Details: hop to gait pattern with R LE NWB'ing; decreased cadence but steady   Stairs             Wheelchair Mobility    Modified Rankin (Stroke Patients Only)       Balance Overall balance assessment: Needs assistance Sitting-balance support: No upper extremity supported Sitting balance-Leahy Scale: Good Sitting balance - Comments: steady sitting reaching within BOS   Standing balance support: Single extremity supported Standing balance-Leahy Scale: Poor Standing balance comment: pt requiring at least single UE support for static standing balance                            Cognition Arousal/Alertness: Awake/alert Behavior During Therapy: WFL for tasks assessed/performed Overall Cognitive Status: Within Functional Limits for tasks assessed                                 General Comments: eager to participate with physical therapy      Exercises  Deferred d/t significant R LE residual limb pain.    General Comments  General comments (skin integrity, edema, etc.): R LE residual limb bandage in place.  Nursing cleared pt for participation in physical therapy.  Pt agreeable to PT session.      Pertinent Vitals/Pain Pain Assessment: 0-10 Pain Score: 8  Pain Location: R LE residual limb Pain Descriptors / Indicators: Constant;Aching;Grimacing;Discomfort;Guarding;Sore Pain Intervention(s): Limited activity within patient's tolerance;Monitored during session;Repositioned;Patient requesting pain meds-RN notified;RN gave  pain meds during session(RN gave pt pain meds end of session)  Pt's BP 170/102 at rest beginning of session and BP 171/85 end of session at rest.    Home Living                      Prior Function            PT Goals (current goals can now be found in the care plan section) Acute Rehab PT Goals Patient Stated Goal: to go home and take care of myself again PT Goal Formulation: With patient Time For Goal Achievement: 01/13/19 Potential to Achieve Goals: Fair Progress towards PT goals: Progressing toward goals    Frequency    7X/week      PT Plan Current plan remains appropriate    Co-evaluation              AM-PAC PT "6 Clicks" Mobility   Outcome Measure  Help needed turning from your back to your side while in a flat bed without using bedrails?: A Little Help needed moving from lying on your back to sitting on the side of a flat bed without using bedrails?: A Little Help needed moving to and from a bed to a chair (including a wheelchair)?: A Little Help needed standing up from a chair using your arms (e.g., wheelchair or bedside chair)?: A Little Help needed to walk in hospital room?: A Little Help needed climbing 3-5 steps with a railing? : A Lot 6 Click Score: 17    End of Session Equipment Utilized During Treatment: Gait belt Activity Tolerance: Patient limited by pain Patient left: in chair;with call bell/phone within reach;with chair alarm set;Other (comment)(R LE residual limb elevated on pillow) Nurse Communication: Mobility status;Precautions;Patient requests pain meds PT Visit Diagnosis: Other abnormalities of gait and mobility (R26.89);Muscle weakness (generalized) (M62.81);Difficulty in walking, not elsewhere classified (R26.2);Pain;Unsteadiness on feet (R26.81) Pain - Right/Left: Right Pain - part of body: Leg     Time: 2706-2376 PT Time Calculation (min) (ACUTE ONLY): 29 min  Charges:  $Gait Training: 8-22 mins $Therapeutic Activity:  8-22 mins                    Leitha Bleak, PT 01/01/19, 12:34 PM [phone]

## 2019-01-02 LAB — CBC
HCT: 25.5 % — ABNORMAL LOW (ref 36.0–46.0)
Hemoglobin: 8.7 g/dL — ABNORMAL LOW (ref 12.0–15.0)
MCH: 28.6 pg (ref 26.0–34.0)
MCHC: 34.1 g/dL (ref 30.0–36.0)
MCV: 83.9 fL (ref 80.0–100.0)
Platelets: 731 10*3/uL — ABNORMAL HIGH (ref 150–400)
RBC: 3.04 MIL/uL — ABNORMAL LOW (ref 3.87–5.11)
RDW: 14.1 % (ref 11.5–15.5)
WBC: 16.9 10*3/uL — ABNORMAL HIGH (ref 4.0–10.5)
nRBC: 0 % (ref 0.0–0.2)

## 2019-01-02 LAB — BASIC METABOLIC PANEL WITH GFR
Anion gap: 11 (ref 5–15)
BUN: 18 mg/dL (ref 8–23)
CO2: 24 mmol/L (ref 22–32)
Calcium: 8.8 mg/dL — ABNORMAL LOW (ref 8.9–10.3)
Chloride: 103 mmol/L (ref 98–111)
Creatinine, Ser: 0.95 mg/dL (ref 0.44–1.00)
GFR calc Af Amer: >60 mL/min
GFR calc non Af Amer: >60 mL/min
Glucose, Bld: 142 mg/dL — ABNORMAL HIGH (ref 70–99)
Potassium: 3.7 mmol/L (ref 3.5–5.1)
Sodium: 138 mmol/L (ref 135–145)

## 2019-01-02 LAB — GLUCOSE, CAPILLARY
Glucose-Capillary: 135 mg/dL — ABNORMAL HIGH (ref 70–99)
Glucose-Capillary: 182 mg/dL — ABNORMAL HIGH (ref 70–99)
Glucose-Capillary: 160 mg/dL — ABNORMAL HIGH (ref 70–99)
Glucose-Capillary: 124 mg/dL — ABNORMAL HIGH (ref 70–99)

## 2019-01-02 NOTE — Progress Notes (Signed)
Occupational Therapy Treatment Patient Details Name: Sylvia [name] MRN: [id number] DOB: 1956/01/13 Today's Date: 01/02/2019    History of present illness Pt is a 63 y.o. female presenting to hospital 12/14/18 with abdominal pain, nausea, vomiting, and diarrhea. 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  S/p angio 8/31 R LE d/t PAD with resting pain B LE's.  PMH includes htn, DM, PAD, anxiety, COPD, h/o DVT.   OT comments  Pt seen for OT tx this date. Pt reporting minimal pain at rest, increasing significantly with R knee extension attempts up to 8/10. RN notified at end of session. Pt instructed in desensitization strategies and positioning to support recall and carryover; pt verbalized understanding and able to return demo proper technique. Pt instructed in modifications to LB dressing to maximize safety/independence while minimizing caregiver burden. Pt verbalized understanding. Pt progressing towards OT goals; continues to benefit from skilled OT intervention. Continue to recommend [place].    Follow Up Recommendations  Home health OT    Equipment Recommendations  3 in 1 bedside commode    Recommendations for Other Services      Precautions / Restrictions Precautions Precautions: Fall Precaution Comments: Rt BKA Restrictions Weight Bearing Restrictions: Yes RLE Weight Bearing: Non weight bearing       Mobility Bed Mobility               General bed mobility comments: Deferred (pt sitting in recliner beginning/end of session)  Transfers Overall transfer level: Needs assistance Equipment used: Rolling walker (2 wheeled) Transfers: Sit to/from Stand Sit to Stand: Min guard         General transfer comment: stood with ease to walker but requires verbal cues for hand placements for sit and stand.    Balance Overall balance assessment: Needs  assistance Sitting-balance support: No upper extremity supported Sitting balance-Leahy Scale: Good     Standing balance support: Bilateral upper extremity supported Standing balance-Leahy Scale: Fair                             ADL either performed or assessed with clinical judgement   ADL                                               Vision Patient Visual Report: No change from baseline     Perception     Praxis      Cognition Arousal/Alertness: Awake/alert Behavior During Therapy: WFL for tasks assessed/performed Overall Cognitive Status: Within Functional Limits for tasks assessed                                          Exercises Other Exercises Other Exercises: Pt instructed in desensitization strategies and positioning of residual limb in preparation for future prosthesis with pt demo'ing good recall and able to return demo proper techniques; limited by pain Other Exercises: Pt instructed in modifications for LB dressing to improve independence and safety while minimizing caregiver burden - pt verbalized understanding   Shoulder Instructions       General Comments      Pertinent Vitals/ Pain  Pain Assessment: 0-10 Pain Score: 8  Faces Pain Scale: Hurts little more Pain Location: residual limb with movement Pain Descriptors / Indicators: Constant;Aching;Grimacing;Discomfort;Guarding;Sore Pain Intervention(s): Limited activity within patient's tolerance;Monitored during session;Repositioned;Patient requesting pain meds-RN notified  Home Living                                          Prior Functioning/Environment              Frequency  Min 2X/week        Progress Toward Goals  OT Goals(current goals can now be found in the care plan section)  Progress towards OT goals: Progressing toward goals  Acute Rehab OT Goals Patient Stated Goal: to go home and take care of myself  again OT Goal Formulation: With patient Time For Goal Achievement: 01/12/19 Potential to Achieve Goals: Good  Plan Discharge plan remains appropriate;Frequency remains appropriate    Co-evaluation                 AM-PAC OT "6 Clicks" Daily Activity     Outcome Measure   Help from another person eating meals?: None Help from another person taking care of personal grooming?: None Help from another person toileting, which includes using toliet, bedpan, or urinal?: A Little Help from another person bathing (including washing, rinsing, drying)?: A Little Help from another person to put on and taking off regular upper body clothing?: None Help from another person to put on and taking off regular lower body clothing?: A Little 6 Click Score: 21    End of Session    OT Visit Diagnosis: Pain;Other abnormalities of gait and mobility (R26.89) Pain - Right/Left: Right Pain - part of body: Leg   Activity Tolerance Patient tolerated treatment well   Patient Left in chair;with call bell/phone within reach;with chair alarm set   Nurse Communication          Time: 4098-1191 OT Time Calculation (min): 12 min  Charges: OT General Charges $OT Visit: 1 Visit OT Treatments $Self Care/Home Management : 8-22 mins  Richrd Prime, MPH, MS, OTR/L ascom [phone] 01/02/19, 3:20 PM

## 2019-01-02 NOTE — Progress Notes (Signed)
Nutrition Follow Up Note   DOCUMENTATION CODES:   Not applicable  INTERVENTION:   Ensure Max protein supplement BID, each supplement provides 150kcal and 30g of protein.  MVI daily   Vitamin C 250mg  po BID  NUTRITION DIAGNOSIS:   Increased nutrient needs related to wound healing as evidenced by increased estimated needs.  GOAL:   Patient will meet greater than or equal to 90% of their needs -progressing   MONITOR:   PO intake, Supplement acceptance, Labs, Weight trends, Skin, I & O's  ASSESSMENT:   62 y.o. female with a PMHx of diabetes mellitus type 2, COPD, GERD, hypertension, severe peripheral vascular disease who was admitted to [facility] on 12/14/2018 for evaluation of abdominal pain, nausea, vomiting, and diarrhea. 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  Pt with improved appetite and oral intake; pt eating 50-100% of meals and drinking Ensure Max. Per chart, pt with weight gain since admit but has not been weighed since 9/10.    Medications reviewed and include: aspirin, colace, hydromorphone, insulin, reglan, MVI, protonix, miralax, vitamin C  Labs reviewed: wbc- 16.9(H), Hgb 8.7(L), Hct 25.5(L) cbgs- 135, 182 x 24 hrs  Diet Order:   Diet Order            Diet Carb Modified Fluid consistency: Thin; Room service appropriate? Yes  Diet effective now             EDUCATION NEEDS:   Education needs have been addressed  Skin:  Skin Assessment: Reviewed RN Assessment(incision R leg s/p BKA)  Last BM:  9/13  Height:   Ht Readings from Last 1 Encounters:  12/14/18 5\' 7"  (1.702 m)    Weight:   Wt Readings from Last 1 Encounters:  12/28/18 69.3 kg    Ideal Body Weight:  57.7 kg(adjusted for R BKA)  BMI:  Body mass index is 23.93 kg/m.  Estimated Nutritional Needs:   Kcal:   1600-1800kcal/day  Protein:  80-90g/day  Fluid:  >1.6L/day  Koleen Distance MS, RD, LDN Pager #- [phone] Office#- [phone] After Hours Pager: [phone]

## 2019-01-02 NOTE — Progress Notes (Signed)
[employer] - [place] at [facility]   PATIENT NAME: Sylvia [name]    MR#:  [id number]  DATE OF BIRTH:  01-09-1956  Subjective:   Patient denies any additional melena.  She does state that she has had dark stools over the last couple of weeks.  No hematochezia.  She denies any abdominal pain.  She states she is feeling fine today.  REVIEW OF SYSTEMS:  CONSTITUTIONAL: No fever, fatigue or weakness.  EYES: No blurred or double vision.  EARS, NOSE, AND THROAT: No tinnitus or ear pain.  RESPIRATORY: No cough, shortness of breath, wheezing or hemoptysis.  CARDIOVASCULAR: No chest pain, orthopnea, edema.  GASTROINTESTINAL: No nausea or vomiting. + Melena GENITOURINARY: No dysuria, hematuria.  ENDOCRINE: No polyuria, nocturia,  HEMATOLOGY: No bleeding, no bruising MUSCULOSKELETAL:  severe right stump pain  PSYCHIATRY: No anxiety or depression.   DRUG ALLERGIES:   Allergies  Allergen Reactions  . Penicillins Hives    Has patient had a PCN reaction causing immediate rash, facial/tongue/throat swelling, SOB or lightheadedness with hypotension: Yes Has patient had a PCN reaction causing severe rash involving mucus membranes or skin necrosis: No Has patient had a PCN reaction that required hospitalization: No Has patient had a PCN reaction occurring within the last 10 years: No If all of the above answers are "NO", then may proceed with Cephalosporin use.  . Tramadol Itching    VITALS:  Blood pressure (!) 152/82, pulse 75, temperature 98.4 F (36.9 C), temperature source Oral, resp. rate 20, height 5\' 7"  (1.702 m), weight 69.3 kg, SpO2 99 %.  PHYSICAL EXAMINATION:  GENERAL:  63 y.o.-year-old patient lying in the bed with no acute distress.  EYES: Pupils equal, round, reactive to light and accommodation. No scleral icterus. Extraocular muscles intact.  HEENT: Head atraumatic, normocephalic. Oropharynx and nasopharynx clear.  NECK:  Supple, no jugular venous  distention. No thyroid enlargement, no tenderness.  LUNGS: Clear to auscultation with occasional basilar crepitations present CARDIOVASCULAR: RRR, S1, S2. No murmurs, rubs, or gallops.  ABDOMEN: Soft, nontender, nondistended. Bowel sounds present. No organomegaly or mass.  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  nEUROLOGIC: Cranial nerves II through XII are intact. + Global weakness. Sensation intact. Gait not checked.  PSYCHIATRIC: The patient is alert and oriented x 3.  SKIN: R BKA with dressing in place LABORATORY PANEL:   CBC Recent Labs  Lab 01/02/19 0603  WBC 16.9*  HGB 8.7*  HCT 25.5*  PLT 731*   ------------------------------------------------------------------------------------------------------------------  Chemistries  Recent Labs  Lab 12/31/18 0427  01/02/19 0603  NA 139   < > 138  K 3.3*   < > 3.7  CL 106   < > 103  CO2 25   < > 24  GLUCOSE 118*   < > 142*  BUN 15   < > 18  CREATININE 1.05*   < > 0.95  CALCIUM 8.4*   < > 8.8*  MG 1.8  --   --    < > = values in this interval not displayed.   ------------------------------------------------------------------------------------------------------------------  Cardiac Enzymes No results for input(s): TROPONINI in the last 168 hours. ------------------------------------------------------------------------------------------------------------------  RADIOLOGY:  No results found.  ASSESSMENT AND PLAN:   GI bleed with a history of chronic normocytic anemia- has had some acute blood loss anemia this hospitalization, initially felt to be due to multiple angiograms and BKA. S/p 4 units PRBC so far this admission. Hemoglobin stable today. -Anemia panel unremarkable -GI following- plan  for endoscopy tomorrow -Continue IV protonix -Holding eliquis, will restart as soon as we are able  Severe PAD- s/p multiple RLE endovascular interventions, now with right BKA.  Pain is improving. -Vascular surgery  following -Continue aspirin and liptor -Holding Eliquis due to GI bleed -PT recommending home health  Leukocytosis- WBC bumped from yesterday to today. Likely reactive. No fevers or signs of infection. Stump site looks good per vascular. -Monitor  Hypertension- BP improving -Continue metoprolol 50mg  bid -Lisinopril 5mg  daily started this admission -Hydralazine IV prn  Uncontrolled type 2 diabetes -Continue home Tradjenta -Continue SSI   All the records are reviewed and case discussed with Care Management/Social Workerr. Management plans discussed with the patient, family and they are in agreement.  CODE STATUS: Full  TOTAL TIME TAKING CARE OF THIS PATIENT: 42 minutes.   More than 50% time spent in counseling, coordination of care   POSSIBLE D/C tomorrow, DEPENDING ON CLINICAL CONDITION.   Sylvia [name] M.D on 01/02/2019   Between 7am to 6pm - Pager - [phone]  After 6pm go to [email or web address] - password [email or web address] [place] [facility]  Office  [phone]  CC: Primary care physician; Center, [place]  Note: This dictation was prepared with Diplomatic Services operational officer dictation along with smaller phrase technology. Any transcriptional errors that result from this process are unintentional.

## 2019-01-02 NOTE — Progress Notes (Signed)
Physical Therapy Treatment Patient Details Name: Sylvia [name] MRN: [id number] DOB: 1956-01-06 Today's Date: 01/02/2019    History of Present Illness Pt is a 63 y.o. female presenting to hospital 12/14/18 with abdominal pain, nausea, vomiting, and diarrhea. 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  S/p angio 8/31 R LE d/t PAD with resting pain B LE's.  PMH includes htn, DM, PAD, anxiety, COPD, h/o DVT.    PT Comments    In recliner, ready for session.  Stood and walked 20' x 1 and then 10' x 1 with hop-to gait pattern and generally steady but +1 for safety.  Stretching and quad sets to encourage extension at R knee with education.  Participated in exercises as described below.  Sister in who will be primary caregiver in and discussed discharge plan and safety.  Voiced understanding.  Pt began crying at end of session.  "I'm just tired."  Pt voiced frustration over hospitalization and physical condition.  Encouragement given and sister remained in for support.     Follow Up Recommendations  Home health PT;Supervision/Assistance - 24 hour     Equipment Recommendations  Rolling walker with 5" wheels;3in1 (PT);Wheelchair (measurements PT);Wheelchair cushion (measurements PT)    Recommendations for Other Services       Precautions / Restrictions Precautions Precautions: Fall Precaution Comments: Rt BKA Restrictions Weight Bearing Restrictions: Yes RLE Weight Bearing: Non weight bearing    Mobility  Bed Mobility               General bed mobility comments: Deferred (pt sitting in recliner beginning/end of session)  Transfers Overall transfer level: Needs assistance Equipment used: Rolling walker (2 wheeled) Transfers: Sit to/from Stand Sit to Stand: Min guard         General transfer comment: stood with ease to walker but requires verbal  cues for hand placements for sit and stand.  Ambulation/Gait Ambulation/Gait assistance: Min guard Gait Distance (Feet): 20 Feet Assistive device: Rolling walker (2 wheeled) Gait Pattern/deviations: Step-to pattern Gait velocity: decreased   General Gait Details: hop to gait pattern with R LE NWB'ing; decreased cadence but steady  20' then an additioanl 10' after rest   Stairs             Wheelchair Mobility    Modified Rankin (Stroke Patients Only)       Balance Overall balance assessment: Needs assistance Sitting-balance support: No upper extremity supported Sitting balance-Leahy Scale: Good     Standing balance support: Bilateral upper extremity supported Standing balance-Leahy Scale: Fair                              Cognition Arousal/Alertness: Awake/alert Behavior During Therapy: WFL for tasks assessed/performed Overall Cognitive Status: Within Functional Limits for tasks assessed                                        Exercises Other Exercises Other Exercises: stretching R knee and education for importance of knee ext for prosthesis Other Exercises: standing hip/knee flexion, ab/add and SLR x 10    General Comments        Pertinent Vitals/Pain Pain Assessment: Faces Faces Pain Scale: Hurts little more Pain Location: R le with stretching and after activity. Pain Descriptors /  Indicators: Constant;Aching;Grimacing;Discomfort;Guarding;Sore Pain Intervention(s): Limited activity within patient's tolerance;Monitored during session    Home Living                      Prior Function            PT Goals (current goals can now be found in the care plan section) Progress towards PT goals: Progressing toward goals    Frequency    7X/week      PT Plan Current plan remains appropriate    Co-evaluation              AM-PAC PT "6 Clicks" Mobility   Outcome Measure  Help needed turning from your back  to your side while in a flat bed without using bedrails?: A Little Help needed moving from lying on your back to sitting on the side of a flat bed without using bedrails?: A Little Help needed moving to and from a bed to a chair (including a wheelchair)?: A Little   Help needed to walk in hospital room?: A Little Help needed climbing 3-5 steps with a railing? : A Lot 6 Click Score: 14    End of Session Equipment Utilized During Treatment: Gait belt Activity Tolerance: Patient tolerated treatment well;Other (comment) Patient left: in chair;with call bell/phone within reach;with chair alarm set;with family/visitor present   Pain - Right/Left: Right Pain - part of body: Leg     Time: 6384-5364 PT Time Calculation (min) (ACUTE ONLY): 19 min  Charges:  $Gait Training: 8-22 mins                    Chesley Noon, PTA 01/02/19, 1:12 PM

## 2019-01-03 ENCOUNTER — Encounter
Admission: EM | Disposition: A | Discharge status: Home Health | Payer: Self-pay | Source: Home / Self Care | Attending: Internal Medicine

## 2019-01-03 ENCOUNTER — Inpatient Hospital Stay: Payer: Medicaid Other | Admitting: Anesthesiology

## 2019-01-03 DIAGNOSIS — K269 Duodenal ulcer, unspecified as acute or chronic, without hemorrhage or perforation: Secondary | ICD-10-CM

## 2019-01-03 DIAGNOSIS — K921 Melena: Secondary | ICD-10-CM

## 2019-01-03 HISTORY — PX: ESOPHAGOGASTRODUODENOSCOPY (EGD) WITH PROPOFOL: SHX5813

## 2019-01-03 LAB — CBC
HCT: 25.9 % — ABNORMAL LOW (ref 36.0–46.0)
Hemoglobin: 8.6 g/dL — ABNORMAL LOW (ref 12.0–15.0)
MCH: 28.2 pg (ref 26.0–34.0)
MCHC: 33.2 g/dL (ref 30.0–36.0)
MCV: 84.9 fL (ref 80.0–100.0)
Platelets: 752 10*3/uL — ABNORMAL HIGH (ref 150–400)
RBC: 3.05 MIL/uL — ABNORMAL LOW (ref 3.87–5.11)
RDW: 14.2 % (ref 11.5–15.5)
WBC: 16.7 10*3/uL — ABNORMAL HIGH (ref 4.0–10.5)
nRBC: 0 % (ref 0.0–0.2)

## 2019-01-03 LAB — BASIC METABOLIC PANEL
Anion gap: 7 (ref 5–15)
BUN: 20 mg/dL (ref 8–23)
CO2: 28 mmol/L (ref 22–32)
Calcium: 8.7 mg/dL — ABNORMAL LOW (ref 8.9–10.3)
Chloride: 106 mmol/L (ref 98–111)
Creatinine, Ser: 1.07 mg/dL — ABNORMAL HIGH (ref 0.44–1.00)
GFR calc Af Amer: >60 mL/min (ref >60)
GFR calc non Af Amer: 55 mL/min — ABNORMAL LOW (ref >60)
Glucose, Bld: 141 mg/dL — ABNORMAL HIGH (ref 70–99)
Potassium: 4.1 mmol/L (ref 3.5–5.1)
Sodium: 141 mmol/L (ref 135–145)

## 2019-01-03 LAB — GLUCOSE, CAPILLARY
Glucose-Capillary: 125 mg/dL — ABNORMAL HIGH (ref 70–99)
Glucose-Capillary: 115 mg/dL — ABNORMAL HIGH (ref 70–99)
Glucose-Capillary: 128 mg/dL — ABNORMAL HIGH (ref 70–99)
Glucose-Capillary: 156 mg/dL — ABNORMAL HIGH (ref 70–99)

## 2019-01-03 SURGERY — ESOPHAGOGASTRODUODENOSCOPY (EGD) WITH PROPOFOL
Anesthesia: General

## 2019-01-03 MED ORDER — PROPOFOL 500 MG/50ML IV EMUL
INTRAVENOUS | Status: DC | PRN
  Administered 2019-01-03: 200 ug/kg/min via INTRAVENOUS

## 2019-01-03 MED ORDER — LIDOCAINE 2% (20 MG/ML) 5 ML SYRINGE
INTRAMUSCULAR | Status: DC | PRN
  Administered 2019-01-03: 100 mg via INTRAVENOUS

## 2019-01-03 MED ORDER — ONDANSETRON HCL 4 MG/2ML IJ SOLN
INTRAMUSCULAR | Status: DC | PRN
  Administered 2019-01-03: 4 mg via INTRAVENOUS

## 2019-01-03 MED ORDER — GLYCOPYRROLATE 0.2 MG/ML IJ SOLN
INTRAMUSCULAR | Status: DC | PRN
  Administered 2019-01-03: 0.2 mg via INTRAVENOUS

## 2019-01-03 MED ORDER — SODIUM CHLORIDE 0.9 % IV SOLN
INTRAVENOUS | Status: DC
  Administered 2019-01-03: 1000 mL via INTRAVENOUS

## 2019-01-03 MED ORDER — PROPOFOL 500 MG/50ML IV EMUL
INTRAVENOUS | Status: AC
  Filled 2019-01-03: qty 50

## 2019-01-03 MED ORDER — PROPOFOL 10 MG/ML IV BOLUS
INTRAVENOUS | Status: DC | PRN
  Administered 2019-01-03: 70 mg via INTRAVENOUS

## 2019-01-03 NOTE — Anesthesia Preprocedure Evaluation (Signed)
Anesthesia Evaluation  Patient identified by MRN, date of birth, ID band Patient awake    Reviewed: Allergy & Precautions, H&P , NPO status , Patient's Chart, lab work & pertinent test results, reviewed documented beta blocker date and time   Airway Mallampati: II  TM Distance: >3 FB Neck ROM: full    Dental  (+) Poor Dentition   Pulmonary COPD, Current Smoker, former smoker,    Pulmonary exam normal        Cardiovascular Exercise Tolerance: Poor hypertension, On Medications + Peripheral Vascular Disease  Normal cardiovascular exam Rhythm:regular Rate:Normal     Neuro/Psych Anxiety negative neurological ROS  negative psych ROS   GI/Hepatic Neg liver ROS, GERD  ,  Endo/Other  negative endocrine ROSdiabetes, Well Controlled  Renal/GU negative Renal ROS  negative genitourinary   Musculoskeletal   Abdominal   Peds  Hematology  (+) anemia ,   Anesthesia Other Findings Past Medical History: No date: Anxiety     Comment:  h/o No date: COPD (chronic obstructive pulmonary disease) (HCC) No date: Diabetes mellitus without complication (HCC) No date: GERD (gastroesophageal reflux disease) No date: Hypertension     Comment:  bp under control-off meds since 2019   Reproductive/Obstetrics negative OB ROS                             Anesthesia Physical Anesthesia Plan  ASA: III  Anesthesia Plan: General   Post-op Pain Management:    Induction: Intravenous  PONV Risk Score and Plan: 2 and Propofol infusion  Airway Management Planned: Natural Airway  Additional Equipment:   Intra-op Plan:   Post-operative Plan:   Informed Consent: I have reviewed the patients History and Physical, chart, labs and discussed the procedure including the risks, benefits and alternatives for the proposed anesthesia with the patient or authorized representative who has indicated his/her understanding and  acceptance.     Dental advisory given  Plan Discussed with: CRNA and Anesthesiologist  Anesthesia Plan Comments:         Anesthesia Quick Evaluation

## 2019-01-03 NOTE — Anesthesia Postprocedure Evaluation (Signed)
Anesthesia Post Note  Patient: Sylvia [name]  Procedure(s) Performed: ESOPHAGOGASTRODUODENOSCOPY (EGD) WITH PROPOFOL (N/A )  Patient location during evaluation: Endoscopy Anesthesia Type: General Level of consciousness: awake and alert and oriented Pain management: pain level controlled Vital Signs Assessment: post-procedure vital signs reviewed and stable Respiratory status: spontaneous breathing, nonlabored ventilation and respiratory function stable Cardiovascular status: blood pressure returned to baseline and stable Postop Assessment: no signs of nausea or vomiting Anesthetic complications: no     Last Vitals:  Vitals:   01/03/19 1110 01/03/19 1147  BP: (!) 154/81 (!) 147/70  Pulse: 84 81  Resp: 16 15  Temp:  36.8 C  SpO2: 98% 100%    Last Pain:  Vitals:   01/03/19 1147  TempSrc: Oral  PainSc:                  Sylvia [name]

## 2019-01-03 NOTE — Progress Notes (Signed)
OT Cancellation Note  Patient Details Name: Sylvia [name] MRN: [id number] DOB: January 11, 1956   Cancelled Treatment:    Reason Eval/Treat Not Completed: Patient at procedure or test/ unavailable. Pt out of the room for procedure. Will re-attempt at later date/time as pt is available and medically appropriate.   Jeni Salles, MPH, MS, OTR/L ascom [phone] 01/03/19, 10:30 AM

## 2019-01-03 NOTE — Op Note (Signed)
[facility] Gastroenterology Patient Name: Sylvia [name] Procedure Date: 01/03/2019 10:33 AM MRN: [id number] Account #: [id number] Date of Birth: 1955-09-15 Admit Type: Inpatient Age: 62 Room: [facility] ENDO ROOM 4 Gender: Female Note Status: Finalized Procedure:            Upper GI endoscopy Indications:          Melena Providers:            Lucilla Lame MD, MD Referring MD:         [place] Medicines:            Propofol per Anesthesia Complications:        No immediate complications. Procedure:            Pre-Anesthesia Assessment:                       - Prior to the procedure, a History and Physical was                        performed, and patient medications and allergies were                        reviewed. The patient's tolerance of previous                        anesthesia was also reviewed. The risks and benefits of                        the procedure and the sedation options and risks were                        discussed with the patient. All questions were                        answered, and informed consent was obtained. Prior                        Anticoagulants: The patient has taken no previous                        anticoagulant or antiplatelet agents. ASA Grade                        Assessment: II - A patient with mild systemic disease.                        After reviewing the risks and benefits, the patient was                        deemed in satisfactory condition to undergo the                        procedure.                       After obtaining informed consent, the endoscope was                        passed under direct vision. Throughout the procedure,  the patient's blood pressure, pulse, and oxygen                        saturations were monitored continuously. The Endoscope                        was introduced through the mouth, and advanced to the                        second  part of duodenum. The upper GI endoscopy was                        accomplished without difficulty. The patient tolerated                        the procedure well. Findings:      The examined esophagus was normal.      The entire examined stomach was normal.      One non-bleeding cratered duodenal ulcer with no stigmata of bleeding       was found in the duodenal bulb. Impression:           - Normal esophagus.                       - Normal stomach.                       - Non-bleeding duodenal ulcer with no stigmata of                        bleeding.                       - No specimens collected. Recommendation:       - Return patient to hospital ward for ongoing care.                       - Resume regular diet.                       - Continue present medications. Procedure Code(s):    --- Professional ---                       [id number], Esophagogastroduodenoscopy, flexible, transoral;                        diagnostic, including collection of specimen(s) by                        brushing or washing, when performed (separate procedure) Diagnosis Code(s):    --- Professional ---                       K92.1, Melena (includes Hematochezia)                       K26.9, Duodenal ulcer, unspecified as acute or chronic,                        without hemorrhage or perforation CPT copyright 2019 American Medical Association. All rights reserved. The codes documented in this report are preliminary and upon coder review may  be  revised to meet current compliance requirements. Sylvia Miniumarren Janayia Burggraf MD, MD 01/03/2019 10:52:33 AM This report has been signed electronically. Number of Addenda: 0 Note Initiated On: 01/03/2019 10:33 AM Estimated Blood Loss: Estimated blood loss: none.      [facility]

## 2019-01-03 NOTE — Care Management (Signed)
Patient suffers from right BKA which impairs their ability to perform daily activities like bathing, dressing, grooming and toileting in the home. A cane, crutch or walker will not resolve issue with performing activities of daily living. A wheelchair will allow patient to safely perform daily activities. Patient can safely propel the wheelchair in the home or has a caregiver who can provide assistance

## 2019-01-03 NOTE — Progress Notes (Signed)
Physical Therapy Treatment Patient Details Name: Sylvia [name] MRN: [id number] DOB: Oct 12, 1955 Today's Date: 01/03/2019    History of Present Illness Pt is a 63 y.o. female presenting to hospital 12/14/18 with abdominal pain, nausea, vomiting, and diarrhea. 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  S/p angio 8/31 R LE d/t PAD with resting pain B LE's.  PMH includes htn, DM, PAD, anxiety, COPD, h/o DVT.    PT Comments    Patient up in chair at start of session, endorsed some pain but agreeable to work with PT. Pt was able to perform seated therapeutic exercises with verbal cues. Pt ambulated ~55ft in total during session with hop to gait pattern, decreased cadence but overall steady, 1-2 standing rest breaks needed. Pt performed one step navigation backwards with RW, x2 trials. minAx2 first attempt, minA for stabilization second attempt. Pt and family endorsed understanding. PT also reviewed extensively other options of stair navigation, importance of TKE on RLE and administered a gait belt and handouts for stair navigation (WC and RW). The patient would benefit from further skilled PT to continue to progress towards goals.    Follow Up Recommendations  Home health PT;Supervision/Assistance - 24 hour     Equipment Recommendations  Rolling walker with 5" wheels;3in1 (PT);Wheelchair (measurements PT);Wheelchair cushion (measurements PT)    Recommendations for Other Services       Precautions / Restrictions Precautions Precautions: Fall Precaution Comments: Rt BKA Restrictions Weight Bearing Restrictions: Yes RLE Weight Bearing: Non weight bearing    Mobility  Bed Mobility               General bed mobility comments: up in chair at start of session  Transfers Overall transfer level: Needs assistance Equipment used: Rolling walker (2  wheeled) Transfers: Sit to/from Stand Sit to Stand: Min guard            Ambulation/Gait Ambulation/Gait assistance: Min guard Gait Distance (Feet): 20 Feet Assistive device: Rolling walker (2 wheeled)       General Gait Details: hop to gait pattern with R LE NWB'ing; decreased cadence but steady  49ft   Stairs Stairs: Yes Stairs assistance: Min assist;+2 safety/equipment   Number of Stairs: 2 General stair comments: 1 step performed twice, backwards hop with walker, minAx2 first attempt, second attempt minA just for stabilization   Wheelchair Mobility    Modified Rankin (Stroke Patients Only)       Balance                                            Cognition Arousal/Alertness: Awake/alert Behavior During Therapy: WFL for tasks assessed/performed Overall Cognitive Status: Within Functional Limits for tasks assessed                                        Exercises General Exercises - Lower Extremity Ankle Circles/Pumps: AROM(12) Long Arc Quad: AROM;Both(12) Hip Flexion/Marching: AROM;Both(12)    General Comments        Pertinent Vitals/Pain Pain Assessment: Faces Faces Pain Scale: Hurts a little bit Pain Location: residual limb with movement Pain Descriptors / Indicators: Aching Pain Intervention(s): Limited activity within patient's tolerance;Monitored during session;Repositioned    Home Living  Prior Function            PT Goals (current goals can now be found in the care plan section) Progress towards PT goals: Progressing toward goals    Frequency    7X/week      PT Plan Current plan remains appropriate    Co-evaluation              AM-PAC PT "6 Clicks" Mobility   Outcome Measure  Help needed turning from your back to your side while in a flat bed without using bedrails?: A Little   Help needed moving to and from a bed to a chair (including a wheelchair)?: A  Little Help needed standing up from a chair using your arms (e.g., wheelchair or bedside chair)?: A Little Help needed to walk in hospital room?: A Little Help needed climbing 3-5 steps with a railing? : A Little 6 Click Score: 15    End of Session Equipment Utilized During Treatment: Gait belt Activity Tolerance: Patient tolerated treatment well Patient left: in chair;with call bell/phone within reach;with chair alarm set;with family/visitor present Nurse Communication: Mobility status PT Visit Diagnosis: Other abnormalities of gait and mobility (R26.89);Muscle weakness (generalized) (M62.81);Difficulty in walking, not elsewhere classified (R26.2);Pain;Unsteadiness on feet (R26.81) Pain - Right/Left: Right Pain - part of body: Leg     Time: 1342-1420 PT Time Calculation (min) (ACUTE ONLY): 38 min  Charges:  $Therapeutic Exercise: 23-37 mins $Therapeutic Activity: 8-22 mins                     Lieutenant Diego PT, DPT 4:35 PM,01/03/19 [phone]

## 2019-01-03 NOTE — TOC Progression Note (Signed)
Transition of Care [facility]) - Progression Note    Patient Details  Name: Alyha Marines MRN: [id number] Date of Birth: [birth year]  Transition of Care [facility]) CM/SW Contact  Beverly Sessions, RN Phone Number: 01/03/2019, 4:23 PM  Clinical Narrative:    RNCM confirmed with Helene Kelp at [place] [facility] that they will still be accepting patient for charity home health  Glucometer kit delivered to patient at bedside  Referral to [facility] with [employer] for charity WC and [facility]  RNCM confirmed with Medication Management  That they have Eliquis in stock.  MD will need to write scripts for discharge medication.     Expected Discharge Plan: [place] Barriers to Discharge: Continued Medical Work up, Inadequate or no insurance  Expected Discharge Plan and Services Expected Discharge Plan: [employer] In-house Referral: Clinical Social Work   Post Acute Care Choice: [place] arrangements for the past 2 months: [place]: RN, PT [place] Agency: [facility] [employer] Date [place]: 12/30/18 Time [place]: [place] Representative spoke with at [place]: [place] ([place]) Interventions    Readmission Risk Interventions Readmission Risk Prevention Plan 12/30/2018  Transportation Screening Complete  PCP or Specialist Appt within 3-5 Days Complete  HRI or [place] Complete  Social Work Consult for [place] Planning/Counseling Complete  Palliative Care Screening Complete  Medication Review Press photographer) Complete  Some recent data might be hidden

## 2019-01-03 NOTE — Progress Notes (Signed)
[employer] [facility] [place] [facility]   PATIENT NAME: Sylvia [name]    MR#:  [id number]  DATE OF BIRTH:  07/20/1955  Subjective:   Doing well today. No additional episodes of bleeding. She states she is feeling fine today. Her pain is well-controlled.  REVIEW OF SYSTEMS:  CONSTITUTIONAL: No fever, fatigue or weakness.  EYES: No blurred or double vision.  EARS, NOSE, AND THROAT: No tinnitus or ear pain.  RESPIRATORY: No cough, shortness of breath, wheezing or hemoptysis.  CARDIOVASCULAR: No chest pain, orthopnea, edema.  GASTROINTESTINAL: No nausea or vomiting.  GENITOURINARY: No dysuria, hematuria.  ENDOCRINE: No polyuria, nocturia,  HEMATOLOGY: No bleeding, no bruising MUSCULOSKELETAL:  severe right stump pain  PSYCHIATRY: No anxiety or depression.   DRUG ALLERGIES:   Allergies  Allergen Reactions  . Penicillins Hives    Has patient had a PCN reaction causing immediate rash, facial/tongue/throat swelling, SOB or lightheadedness with hypotension: Yes Has patient had a PCN reaction causing severe rash involving mucus membranes or skin necrosis: No Has patient had a PCN reaction that required hospitalization: No Has patient had a PCN reaction occurring within the last 10 years: No If all of the above answers are "NO", then may proceed with Cephalosporin use.  . Tramadol Itching    VITALS:  Blood pressure 140/69, pulse 80, temperature 98.5 F (36.9 C), temperature source Oral, resp. rate 20, height 5\' 7"  (1.702 m), weight 69.3 kg, SpO2 98 %.  PHYSICAL EXAMINATION:  GENERAL:  63 y.o.-year-old patient lying in the bed with no acute distress.  EYES: Pupils equal, round, reactive to light and accommodation. No scleral icterus. Extraocular muscles intact.  HEENT: Head atraumatic, normocephalic. Oropharynx and nasopharynx clear.  NECK:  Supple, no jugular venous distention. No thyroid enlargement, no tenderness.  LUNGS: Clear to auscultation with occasional  basilar crepitations present CARDIOVASCULAR: RRR, S1, S2. No murmurs, rubs, or gallops.  ABDOMEN: Soft, nontender, nondistended. Bowel sounds present. No organomegaly or mass.  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  nEUROLOGIC: Cranial nerves II through XII are intact. + Global weakness. Sensation intact. Gait not checked.  PSYCHIATRIC: The patient is alert and oriented x 3.  SKIN: R BKA with dressing in place LABORATORY PANEL:   CBC Recent Labs  Lab 01/03/19 0553  WBC 16.7*  HGB 8.6*  HCT 25.9*  PLT 752*   ------------------------------------------------------------------------------------------------------------------  Chemistries  Recent Labs  Lab 12/31/18 0427  01/03/19 0553  NA 139   < > 141  K 3.3*   < > 4.1  CL 106   < > 106  CO2 25   < > 28  GLUCOSE 118*   < > 141*  BUN 15   < > 20  CREATININE 1.05*   < > 1.07*  CALCIUM 8.4*   < > 8.7*  MG 1.8  --   --    < > = values in this interval not displayed.   ------------------------------------------------------------------------------------------------------------------  Cardiac Enzymes No results for input(s): TROPONINI in the last 168 hours. ------------------------------------------------------------------------------------------------------------------  RADIOLOGY:  No results found.  ASSESSMENT AND PLAN:   GI bleed with a history of chronic normocytic anemia- has had some acute blood loss anemia this hospitalization, initially felt to be due to multiple angiograms and BKA. S/p 4 units PRBC so far this admission. Hemoglobin stable today. -Anemia panel unremarkable -GI following- plan for EGD today, may consider colonoscopy afterwards -Continue IV protonix -Holding eliquis and aspirin  Severe PAD- s/p multiple RLE endovascular  interventions, now with right BKA.  Pain is improving. -Vascular surgery following -Continue liptor -Holding aspirin and eliquis due to GI bleed -PT recommending  home health  Leukocytosis- WBC elevated but stable. Likely reactive. No fevers or signs of infection. Stump site looks good per vascular. -Monitor  Hypertension- BP much improved. Creatinine has bumped slightly, after starting lisinopril. -Continue metoprolol 50mg  bid -Lisinopril 5mg  daily started this admission -Hydralazine IV prn  Uncontrolled type 2 diabetes -Continue home Tradjenta -Continue SSI   All the records are reviewed and case discussed with Care Management/Social Workerr. Management plans discussed with the patient, family and they are in agreement.  CODE STATUS: Full  TOTAL TIME TAKING CARE OF THIS PATIENT: 40 minutes.   More than 50% time spent in counseling, coordination of care   POSSIBLE D/C 1-2 days, DEPENDING ON CLINICAL CONDITION.   Berna Spare Brittanny Levenhagen M.D on 01/03/2019   Between 7am to 6pm - Pager - [phone]  After 6pm go to [email or web address] - password [email or web address]  Office  [phone]  CC: Primary care physician; Center, [place]  Note: This dictation was prepared with Diplomatic Services operational officer dictation along with smaller phrase technology. Any transcriptional errors that result from this process are unintentional.

## 2019-01-03 NOTE — Progress Notes (Signed)
PT Cancellation Note  Patient Details Name: [name] MRN: [id number] DOB: Feb 08, 1956   Cancelled Treatment:    Reason Eval/Treat Not Completed: Other (comment)(Patient out of room at this time, PT will follow up as able.)  Lieutenant Diego PT, DPT 11:16 AM,01/03/19 [phone]

## 2019-01-03 NOTE — Anesthesia Post-op Follow-up Note (Signed)
Anesthesia QCDR form completed.        

## 2019-01-03 NOTE — Transfer of Care (Signed)
Immediate Anesthesia Transfer of Care Note  Patient: Sylvia [name]  Procedure(s) Performed: ESOPHAGOGASTRODUODENOSCOPY (EGD) WITH PROPOFOL (N/A )  Patient Location: Endoscopy Unit  Anesthesia Type:General  Level of Consciousness: awake  Airway & Oxygen Therapy: Patient connected to nasal cannula oxygen  Post-op Assessment: Post -op Vital signs reviewed and stable  Post vital signs: stable  Last Vitals:  Vitals Value Taken Time  BP 133/62 01/03/19 1057  Temp    Pulse 90 01/03/19 1057  Resp 17 01/03/19 1057  SpO2 97 % 01/03/19 1057    Last Pain:  Vitals:   01/03/19 0625  TempSrc:   PainSc: 0-No pain      Patients Stated Pain Goal: 0 (00/76/22 6333)  Complications: No apparent anesthesia complications

## 2019-01-04 ENCOUNTER — Encounter: Payer: Self-pay | Admitting: Gastroenterology

## 2019-01-04 LAB — CBC
HCT: 26.3 % — ABNORMAL LOW (ref 36.0–46.0)
Hemoglobin: 8.9 g/dL — ABNORMAL LOW (ref 12.0–15.0)
MCH: 28.7 pg (ref 26.0–34.0)
MCHC: 33.8 g/dL (ref 30.0–36.0)
MCV: 84.8 fL (ref 80.0–100.0)
Platelets: 727 10*3/uL — ABNORMAL HIGH (ref 150–400)
RBC: 3.1 MIL/uL — ABNORMAL LOW (ref 3.87–5.11)
RDW: 14.1 % (ref 11.5–15.5)
WBC: 16.9 10*3/uL — ABNORMAL HIGH (ref 4.0–10.5)
nRBC: 0 % (ref 0.0–0.2)

## 2019-01-04 LAB — H. PYLORI ANTIGEN, STOOL: H. Pylori Stool Ag, Eia: POSITIVE — AB

## 2019-01-04 LAB — BASIC METABOLIC PANEL
Anion gap: 9 (ref 5–15)
BUN: 20 mg/dL (ref 8–23)
CO2: 26 mmol/L (ref 22–32)
Calcium: 9.4 mg/dL (ref 8.9–10.3)
Chloride: 104 mmol/L (ref 98–111)
Creatinine, Ser: 1.09 mg/dL — ABNORMAL HIGH (ref 0.44–1.00)
GFR calc Af Amer: >60 mL/min (ref >60)
GFR calc non Af Amer: 54 mL/min — ABNORMAL LOW (ref >60)
Glucose, Bld: 175 mg/dL — ABNORMAL HIGH (ref 70–99)
Potassium: 4 mmol/L (ref 3.5–5.1)
Sodium: 139 mmol/L (ref 135–145)

## 2019-01-04 LAB — GLUCOSE, CAPILLARY
Glucose-Capillary: 183 mg/dL — ABNORMAL HIGH (ref 70–99)
Glucose-Capillary: 147 mg/dL — ABNORMAL HIGH (ref 70–99)
Glucose-Capillary: 155 mg/dL — ABNORMAL HIGH (ref 70–99)
Glucose-Capillary: 134 mg/dL — ABNORMAL HIGH (ref 70–99)

## 2019-01-04 MED ORDER — PANTOPRAZOLE SODIUM 40 MG PO TBEC
40.0000 mg | DELAYED_RELEASE_TABLET | Freq: Two times a day (BID) | ORAL | Status: DC
  Administered 2019-01-04 – 2019-01-06 (×4): 40 mg via ORAL
  Filled 2019-01-04 (×4): qty 1

## 2019-01-04 MED ORDER — ASPIRIN EC 81 MG PO TBEC
81.0000 mg | DELAYED_RELEASE_TABLET | Freq: Every day | ORAL | Status: DC
  Administered 2019-01-04 – 2019-01-06 (×3): 81 mg via ORAL
  Filled 2019-01-04 (×3): qty 1

## 2019-01-04 NOTE — Progress Notes (Signed)
Jonathon Bellows , MD [address] [place], [place], [place], [address] [address], [place], [place], [place], [address] Phone: [phone]  Fax: [phone]   Sylvia [name] is being followed for GI bleed  Day 2 of follow up   Subjective: No pain , brown stools, no complaints    Objective: Vital signs in last 24 hours: Vitals:   01/03/19 1147 01/03/19 2040 01/04/19 0505 01/04/19 1307  BP: (!) 147/70 129/79 (!) 153/74 125/61  Pulse: 81 77 84 70  Resp: 15 20 20 16   Temp: 98.2 F (36.8 C) 98.4 F (36.9 C) 99.1 F (37.3 C) 97.9 F (36.6 C)  TempSrc: Oral Oral Oral Oral  SpO2: 100% 100% 98% 100%  Weight:      Height:       Weight change:   Intake/Output Summary (Last 24 hours) at 01/04/2019 1319 Last data filed at 01/04/2019 1000 Gross per 24 hour  Intake 756 ml  Output 650 ml  Net 106 ml     Exam: Heart:: Regular rate and rhythm, S1S2 present or without murmur or extra heart sounds Lungs: normal, clear to auscultation and clear to auscultation and percussion Abdomen: soft, nontender, normal bowel sounds   Lab Results: CBC Latest Ref Rng & Units 01/04/2019 01/03/2019 01/02/2019  WBC 4.0 - 10.5 K/uL 16.9(H) 16.7(H) 16.9(H)  Hemoglobin 12.0 - 15.0 g/dL 8.9(L) 8.6(L) 8.7(L)  Hematocrit 36.0 - 46.0 % 26.3(L) 25.9(L) 25.5(L)  Platelets 150 - 400 K/uL 727(H) 752(H) 731(H)    Micro Results: No results found for this or any previous visit (from the past 240 hour(s)). Studies/Results: No results found. Medications: I have reviewed the patient's current medications. Scheduled Meds: . aspirin EC  81 mg Oral Daily  . atorvastatin  40 mg Oral Daily  . docusate sodium  100 mg Oral BID  . influenza vac split quadrivalent PF  0.5 mL Intramuscular Tomorrow-1000  . insulin aspart  0-5 Units Subcutaneous QHS  . insulin aspart  0-9 Units Subcutaneous TID WC  . iron polysaccharides  150 mg Oral Daily  . linagliptin  5 mg Oral Daily  . lisinopril  5 mg Oral Daily  .  metoCLOPramide  5 mg Oral TID AC  . metoprolol tartrate  50 mg Oral BID  . multivitamin with minerals  1 tablet Oral Daily  . pantoprazole  40 mg Oral BID AC  . polyethylene glycol  17 g Oral Daily  . Ensure Max Protein  11 oz Oral BID  . senna  2 tablet Oral Daily  . sodium chloride flush  10-40 mL Intracatheter Q12H  . sodium chloride flush  3 mL Intravenous Q12H  . vitamin C  250 mg Oral BID   Continuous Infusions: PRN Meds:.acetaminophen, ALPRAZolam, bisacodyl, hydrALAZINE, HYDROmorphone (DILAUDID) injection, metoprolol tartrate, ondansetron (ZOFRAN) IV, oxyCODONE, oxyCODONE, sodium chloride flush   Assessment: Active Problems:   DKA (diabetic ketoacidoses) (Sylvia [name])   Blood in stool   DU (duodenal ulcer) Sylvia [name] is a 63 y.o. 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  I was consulted today for gradual drop in hemoglobin , melena noted for the past few weeks..  She has had blood transfusion despite which the hemoglobin has been in the lower range.EGD on 01/03/2019 :  One non-bleeding cratered duodenal ulcer with no stigmata of bleeding was found in the duodenal bulb.  Plan 1.  Continue PPI  for long term as she is on asprin and eloquis. Avoid NSAIDs, check H. pylori stool antigen.Restart eloquis tomorrow , start asprin today .   She is going to stay as inpatient to ensure Hb stable on asprin and eloquis .   I will sign off.  Please call me if any further GI concerns or questions.  We would like to thank you for the opportunity to participate in the care of Sylvia [name].      LOS: 64 days   Wyline Mood, MD 01/04/2019, 1:19 PM

## 2019-01-04 NOTE — Progress Notes (Signed)
Occupational Therapy Treatment Patient Details Name: Sylvia [name] MRN: [id number] DOB: [birth year] Today's Date: 01/04/2019    History of present illness Pt is a 63 y.o. female presenting to hospital 12/14/18 with abdominal pain, nausea, vomiting, and diarrhea. 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  S/p angio 8/31 R LE d/t PAD with resting pain B LE's.  PMH includes htn, DM, PAD, anxiety, COPD, h/o DVT.   OT comments  Pt seen to review safe set up for toileting using BSC with plans for her to discharge to her sister's home.  Her sister has a large bathroom that will accomodate [facility] over toilet and enough room for FWW as well.  Practiced transfer to and from [facility] from recliner today with supervision only needed.  Pt requested to not use RW and stated that she has been doing this several times at night and requested that chair alarm stay off so she does not have to call for anyone but explained that she was still at risk for falling and that alarm on chair needed to be set again.  She reluctantly agreed knowing it was in her best interest for safety.  Rec she have a walker basket or bag at home to keep her cell phone with her at all times.She is doing well with desensitization of R BKA stump as well as active exercises for R knee in extension and flexion.  She has one area that is at center of stump about 1/2 inch from end of stump that is hypersensitive.  Pt is progressing well with mobility and desensitization techniques for R BKA.  Follow Up Recommendations  Home health OT    Equipment Recommendations  3 in 1 bedside commode    Recommendations for Other Services      Precautions / Restrictions Precautions Precautions: Fall Precaution Comments: Rt BKA Restrictions Weight Bearing Restrictions: Yes RLE Weight Bearing: Non weight bearing       Mobility Bed  Mobility                  Transfers                      Balance                                           ADL either performed or assessed with clinical judgement   ADL Overall ADL's : Needs assistance/impaired                                       General ADL Comments: Pt seen to review safe set up for toileting using BSC with plans for her to discharge to her sister's home.  Her sister has a large bathroom that will accomodate [facility] over toilet and enough room for FWW as well.  Practiced transfer to and from [facility] from recliner today with supervision only needed.  Pt requested to not use RW and stated that she has been doing this several times at night and requested that chair alarm stay off so she does not have to call for anyone but explained that she was still at risk for falling and that alarm on chair needed to be  set again.  She reluctantly agreed knowing it was in her best interest for safety.  Rec she have a walker basket or bag at home to keep her cell phone with her at all times.She is doing well with desensitization of R BKA stump as well as active exercises for R knee in extension and flexion.  She has one area that is at center of stump about 1/2 inch from end of stump that is hypersensitive.  Pt is progressing well with mobility and desensitization techniques for R BKA.     Vision Patient Visual Report: No change from baseline     Perception     Praxis      Cognition Arousal/Alertness: Awake/alert Behavior During Therapy: WFL for tasks assessed/performed Overall Cognitive Status: Within Functional Limits for tasks assessed                                 General Comments: eager to participate with therapy        Exercises     Shoulder Instructions       General Comments      Pertinent Vitals/ Pain       Pain Assessment: 0-10 Faces Pain Scale: Hurts a little bit Pain Location: residual limb with  movement Pain Descriptors / Indicators: Aching;Sore Pain Intervention(s): Limited activity within patient's tolerance;Monitored during session;Repositioned  Home Living                                          Prior Functioning/Environment              Frequency  Min 2X/week        Progress Toward Goals  OT Goals(current goals can now be found in the care plan section)  Progress towards OT goals: Progressing toward goals  Acute Rehab OT Goals Patient Stated Goal: to go home and take care of myself again OT Goal Formulation: With patient Time For Goal Achievement: 01/12/19 Potential to Achieve Goals: Good  Plan Discharge plan remains appropriate;Frequency remains appropriate    Co-evaluation                 AM-PAC OT "6 Clicks" Daily Activity     Outcome Measure   Help from another person eating meals?: None Help from another person taking care of personal grooming?: None Help from another person toileting, which includes using toliet, bedpan, or urinal?: A Little Help from another person bathing (including washing, rinsing, drying)?: A Little Help from another person to put on and taking off regular upper body clothing?: None Help from another person to put on and taking off regular lower body clothing?: A Little 6 Click Score: 21    End of Session    OT Visit Diagnosis: Pain;Other abnormalities of gait and mobility (R26.89) Pain - Right/Left: Right Pain - part of body: Leg   Activity Tolerance Patient tolerated treatment well   Patient Left in chair;with call bell/phone within reach;with chair alarm set   Nurse Communication          Time: 1130-1200 OT Time Calculation (min): 30 min  Charges: OT General Charges $OT Visit: 1 Visit OT Treatments $Self Care/Home Management : 23-37 mins  Susanne Borders, OTR/L, [place] ascom [phone] 01/04/19, 1:09 PM

## 2019-01-04 NOTE — Progress Notes (Signed)
PHARMACIST - PHYSICIAN COMMUNICATION  DR:   Brett Albino  CONCERNING: IV to Oral Route Change Policy  RECOMMENDATION: This patient is receiving pantoprazole by the intravenous route.  Based on criteria approved by the Pharmacy and Therapeutics Committee, the intravenous medication(s) is/are being converted to the equivalent oral dose form(s).   DESCRIPTION: These criteria include:  The patient is eating (either orally or via tube) and/or has been taking other orally administered medications for a least 24 hours  The patient has no evidence of active gastrointestinal bleeding or impaired GI absorption (gastrectomy, short bowel, patient on TNA or NPO).  If you have questions about this conversion, please contact the Pharmacy Department  []   [phone] )  [facility] [x]   [phone] )  [facility] []   [phone] )  [facility] []   [phone] )  [facility] []   [phone] )  [place], PharmD, BCPS Clinical Pharmacist 01/04/2019 12:06 PM

## 2019-01-04 NOTE — Progress Notes (Signed)
Physical Therapy Treatment Patient Details Name: Sylvia [name] MRN: [id number] DOB: [birth year] Today's Date: 01/04/2019    History of Present Illness Pt is a 63 y.o. female presenting to hospital 12/14/18 with abdominal pain, nausea, vomiting, and diarrhea. 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  S/p angio 8/31 R LE d/t PAD with resting pain B LE's.  PMH includes htn, DM, PAD, anxiety, COPD, h/o DVT.    PT Comments    Pt sitting up in chair, very pleasant and eager to work with therapy, though reporting that she has a "stomach ache".  Pt had just received her WC and PT assisted with setup, education and mobility.  Pt able to transfer to [facility] CGA without difficulty.  Pt able to propel WC appropriately and with good UE strength.  PT introduced UE strengthening exercises and advised to practice daily for benefit of improved use of RW and WC.  Pt able to demonstrate carryover with all education.  She reported no pain throughout treatment.  Pt will continue to benefit from skilled PT with focus on safe functional mobility, strength, pain management, use of AD and tolerance to activity.   Follow Up Recommendations  Home health PT;Supervision/Assistance - 24 hour     Equipment Recommendations  Rolling walker with 5" wheels;3in1 (PT);Wheelchair (measurements PT);Wheelchair cushion (measurements PT)    Recommendations for Other Services       Precautions / Restrictions Precautions Precautions: Fall Precaution Comments: Rt BKA Restrictions Weight Bearing Restrictions: Yes RLE Weight Bearing: Non weight bearing    Mobility  Bed Mobility Overal bed mobility: (In chair)             General bed mobility comments: up in chair at start of session  Transfers Overall transfer level: Needs assistance   Transfers: Stand Pivot Transfers     Squat pivot  transfers: Min guard     General transfer comment: Education concerning WC transfer, hand and foot placement.  Assistance with WC setup.  Ambulation/Gait                 Stairs   Stairs assistance: Agricultural engineer [place] (Stroke Patients Only)       Balance Overall balance assessment: Needs assistance Sitting-balance support: No upper extremity supported Sitting balance-Leahy Scale: Good Sitting balance - Comments: steady sitting reaching within BOS   Standing balance support: Bilateral upper extremity supported   Standing balance comment: pt requiring at least single UE support for static standing balance                            Cognition Arousal/Alertness: Awake/alert Behavior During Therapy: WFL for tasks assessed/performed Overall Cognitive Status: Within Functional Limits for tasks assessed                                 General Comments: eager to participate with therapy      Exercises Other Exercises Other Exercises: WC setup and education x10 min Other Exercises: Education: residual limb positioning and review of importance of ROM exercise. Other Exercises: Practice with propelling WC in room x4 min Other Exercises: UE ther ex: shoulder depression, shoulder extension (isometric against front of chair), triceps dips x10 with education.  General Comments        Pertinent Vitals/Pain Pain Assessment: No/denies pain Faces Pain Scale: Hurts a little bit Pain Location: residual limb with movement Pain Descriptors / Indicators: Aching;Sore Pain Intervention(s): Limited activity within patient's tolerance;Monitored during session;Repositioned    Home Living                      Prior Function            PT Goals (current goals can now be found in the care plan section) Acute Rehab PT Goals Patient Stated Goal: to go home and take care of myself  again PT Goal Formulation: With patient Time For Goal Achievement: 01/13/19 Potential to Achieve Goals: Fair Progress towards PT goals: Progressing toward goals    Frequency    7X/week      PT Plan Current plan remains appropriate    Co-evaluation              AM-PAC PT "6 Clicks" Mobility   Outcome Measure  Help needed turning from your back to your side while in a flat bed without using bedrails?: A Little   Help needed moving to and from a bed to a chair (including a wheelchair)?: A Little Help needed standing up from a chair using your arms (e.g., wheelchair or bedside chair)?: A Little Help needed to walk in hospital room?: A Little Help needed climbing 3-5 steps with a railing? : A Little 6 Click Score: 15    End of Session Equipment Utilized During Treatment: Gait belt Activity Tolerance: Patient tolerated treatment well Patient left: in chair;with call bell/phone within reach;with chair alarm set;with family/visitor present Nurse Communication: Mobility status PT Visit Diagnosis: Other abnormalities of gait and mobility (R26.89);Muscle weakness (generalized) (M62.81);Difficulty in walking, not elsewhere classified (R26.2);Pain;Unsteadiness on feet (R26.81) Pain - Right/Left: Right Pain - part of body: Leg     Time: 2876-8115 PT Time Calculation (min) (ACUTE ONLY): 27 min  Charges:  $Therapeutic Exercise: 8-22 mins $Wheel Chair Management: 8-22 mins                     Glenetta Hew, PT, DPT    Glenetta Hew 01/04/2019, 4:03 PM

## 2019-01-04 NOTE — Progress Notes (Signed)
[employer] -  at [facility]   PATIENT NAME: Sylvia [name]    MR#:  [id number]  DATE OF BIRTH:  12-04-1955  Subjective:   Patient states she is feeling pretty good today. She denies any additional melena. No hematochezia or abdominal pain.  REVIEW OF SYSTEMS:  CONSTITUTIONAL: No fever, fatigue or weakness.  EYES: No blurred or double vision.  EARS, NOSE, AND THROAT: No tinnitus or ear pain.  RESPIRATORY: No cough, shortness of breath, wheezing or hemoptysis.  CARDIOVASCULAR: No chest pain, orthopnea, edema.  GASTROINTESTINAL: No nausea or vomiting.  GENITOURINARY: No dysuria, hematuria.  ENDOCRINE: No polyuria, nocturia,  HEMATOLOGY: No bleeding, no bruising MUSCULOSKELETAL:  severe right stump pain  PSYCHIATRY: No anxiety or depression.   DRUG ALLERGIES:   Allergies  Allergen Reactions  . Penicillins Hives    Has patient had a PCN reaction causing immediate rash, facial/tongue/throat swelling, SOB or lightheadedness with hypotension: Yes Has patient had a PCN reaction causing severe rash involving mucus membranes or skin necrosis: No Has patient had a PCN reaction that required hospitalization: No Has patient had a PCN reaction occurring within the last 10 years: No If all of the above answers are "NO", then may proceed with Cephalosporin use.  . Tramadol Itching    VITALS:  Blood pressure (!) 153/74, pulse 84, temperature 99.1 F (37.3 C), temperature source Oral, resp. rate 20, height 5\' 7"  (1.702 m), weight 69.3 kg, SpO2 98 %.  PHYSICAL EXAMINATION:  GENERAL:  63 y.o.-year-old patient lying in the bed with no acute distress.  EYES: Pupils equal, round, reactive to light and accommodation. No scleral icterus. Extraocular muscles intact.  HEENT: Head atraumatic, normocephalic. Oropharynx and nasopharynx clear.  NECK:  Supple, no jugular venous distention. No thyroid enlargement, no tenderness.  LUNGS: Clear to auscultation with occasional  basilar crepitations present CARDIOVASCULAR: RRR, S1, S2. No murmurs, rubs, or gallops.  ABDOMEN: Soft, nontender, nondistended. Bowel sounds present. No organomegaly or mass.  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  nEUROLOGIC: Cranial nerves II through XII are intact. + Global weakness. Sensation intact. Gait not checked.  PSYCHIATRIC: The patient is alert and oriented x 3.  SKIN: R BKA with dressing in place LABORATORY PANEL:   CBC Recent Labs  Lab 01/04/19 0548  WBC 16.9*  HGB 8.9*  HCT 26.3*  PLT 727*   ------------------------------------------------------------------------------------------------------------------  Chemistries  Recent Labs  Lab 12/31/18 0427  01/04/19 0548  NA 139   < > 139  K 3.3*   < > 4.0  CL 106   < > 104  CO2 25   < > 26  GLUCOSE 118*   < > 175*  BUN 15   < > 20  CREATININE 1.05*   < > 1.09*  CALCIUM 8.4*   < > 9.4  MG 1.8  --   --    < > = values in this interval not displayed.   ------------------------------------------------------------------------------------------------------------------  Cardiac Enzymes No results for input(s): TROPONINI in the last 168 hours. ------------------------------------------------------------------------------------------------------------------  RADIOLOGY:  No results found.  ASSESSMENT AND PLAN:   GI bleed with a history of chronic normocytic anemia- has had some acute blood loss anemia this hospitalization, initially felt to be due to multiple angiograms and BKA. S/p 4 units PRBC so far this admission. Hemoglobin has been stable over the last couple of days. -EGD 9/16 with non-bleeding ulcer -Anemia panel unremarkable -Discussed with GI- will plan to restart aspirin today. May be able  to restart eliquis tomorrow. -Continue protonix bid -Holding eliquis and aspirin  Severe PAD- s/p multiple RLE endovascular interventions, now with right BKA.  Pain is improving. -Vascular surgery  following -Continue liptor -Restart aspirin today -Continuing holding eliquis today, may be able to restart tomorrow -PT recommending home health  Leukocytosis- WBC elevated but stable. Likely reactive. No fevers or signs of infection. Stump site looks good per vascular. -Monitor  Hypertension- BP much improved. Creatinine has bumped slightly, after starting lisinopril. -Continue metoprolol 50mg  bid -Lisinopril 5mg  daily started this admission -Hydralazine IV prn  Uncontrolled type 2 diabetes -Continue home Tradjenta -Continue SSI   All the records are reviewed and case discussed with Care Management/Social Workerr. Management plans discussed with the patient, family and they are in agreement.  CODE STATUS: Full  TOTAL TIME TAKING CARE OF THIS PATIENT: 40 minutes.   More than 50% time spent in counseling, coordination of care   POSSIBLE D/C 1-2 days, DEPENDING ON CLINICAL CONDITION.   Berna Spare Samiah Ricklefs M.D on 01/04/2019   Between 7am to 6pm - Pager - [phone]  After 6pm go to [email or web address] - password [email or web address] [place] [facility]  Office  [phone]  CC: Primary care physician; [facility], [place]  Note: This dictation was prepared with Diplomatic Services operational officer dictation along with smaller phrase technology. Any transcriptional errors that result from this process are unintentional.

## 2019-01-05 LAB — BASIC METABOLIC PANEL
Anion gap: 7 (ref 5–15)
BUN: 24 mg/dL — ABNORMAL HIGH (ref 8–23)
CO2: 27 mmol/L (ref 22–32)
Calcium: 9.4 mg/dL (ref 8.9–10.3)
Chloride: 104 mmol/L (ref 98–111)
Creatinine, Ser: 1.04 mg/dL — ABNORMAL HIGH (ref 0.44–1.00)
GFR calc Af Amer: >60 mL/min (ref >60)
GFR calc non Af Amer: 57 mL/min — ABNORMAL LOW (ref >60)
Glucose, Bld: 148 mg/dL — ABNORMAL HIGH (ref 70–99)
Potassium: 4 mmol/L (ref 3.5–5.1)
Sodium: 138 mmol/L (ref 135–145)

## 2019-01-05 LAB — CBC
HCT: 27.2 % — ABNORMAL LOW (ref 36.0–46.0)
Hemoglobin: 9.3 g/dL — ABNORMAL LOW (ref 12.0–15.0)
MCH: 28.8 pg (ref 26.0–34.0)
MCHC: 34.2 g/dL (ref 30.0–36.0)
MCV: 84.2 fL (ref 80.0–100.0)
Platelets: 762 10*3/uL — ABNORMAL HIGH (ref 150–400)
RBC: 3.23 MIL/uL — ABNORMAL LOW (ref 3.87–5.11)
RDW: 14.1 % (ref 11.5–15.5)
WBC: 16.6 10*3/uL — ABNORMAL HIGH (ref 4.0–10.5)
nRBC: 0 % (ref 0.0–0.2)

## 2019-01-05 LAB — GLUCOSE, CAPILLARY
Glucose-Capillary: 134 mg/dL — ABNORMAL HIGH (ref 70–99)
Glucose-Capillary: 156 mg/dL — ABNORMAL HIGH (ref 70–99)
Glucose-Capillary: 135 mg/dL — ABNORMAL HIGH (ref 70–99)
Glucose-Capillary: 168 mg/dL — ABNORMAL HIGH (ref 70–99)
Glucose-Capillary: 143 mg/dL — ABNORMAL HIGH (ref 70–99)

## 2019-01-05 LAB — H. PYLORI ANTIBODY, IGG: H Pylori IgG: 3.2 Index Value — ABNORMAL HIGH (ref 0.00–0.79)

## 2019-01-05 MED ORDER — LISINOPRIL 5 MG PO TABS: 5.0000 mg | ORAL_TABLET | Freq: Every day | ORAL | 0 refills | Status: DC

## 2019-01-05 MED ORDER — APIXABAN 2.5 MG PO TABS: 2.5000 mg | ORAL_TABLET | Freq: Two times a day (BID) | ORAL | 0 refills | Status: DC

## 2019-01-05 MED ORDER — ASPIRIN 81 MG PO TBEC: 81.0000 mg | DELAYED_RELEASE_TABLET | Freq: Every day | ORAL | 0 refills | Status: DC

## 2019-01-05 MED ORDER — APIXABAN 2.5 MG PO TABS
2.5000 mg | ORAL_TABLET | Freq: Two times a day (BID) | ORAL | Status: DC
  Administered 2019-01-05 – 2019-01-06 (×3): 2.5 mg via ORAL
  Filled 2019-01-05 (×3): qty 1

## 2019-01-05 MED ORDER — BIOTIN 1000 MCG PO TABS: 1000.0000 ug | ORAL_TABLET | Freq: Every day | ORAL | 0 refills | Status: DC

## 2019-01-05 MED ORDER — METOPROLOL TARTRATE 50 MG PO TABS: 50.0000 mg | ORAL_TABLET | Freq: Two times a day (BID) | ORAL | 0 refills | Status: DC

## 2019-01-05 NOTE — TOC Progression Note (Signed)
Transition of Care [facility]) - Progression Note    Patient Details  Name: Rylie [place]burg MRN: [id number] Date of Birth: 1956-04-06  Transition of Care [facility]) CM/SW Contact  Beverly Sessions, RN Phone Number: 01/05/2019, 4:48 PM  Clinical Narrative:    Patient to discharge on eliquis, aspirin, lisinopril, and metoprolol.  RNCM obtained discharge medications from Medication Management  At no cost.  They were placed in patient's medication bins.  Patient notified and to let nurse know to get them prior to discharge  Bedside RN/Charge RN notified     Expected Discharge Plan: [place] Barriers to Discharge: Continued Medical Work up, Inadequate or no insurance  Expected Discharge Plan and Services Expected Discharge Plan: [place] In-house Referral: Clinical Social Work   Post Acute Care Choice: [place] arrangements for the past 2 months: [place]: RN, PT [place] Agency: [facility] [employer]) Date [place]: 12/30/18 Time [place]: [place] Representative spoke with at [place]: [place] ([place]) Interventions    Readmission Risk Interventions Readmission Risk Prevention Plan 12/30/2018  Transportation Screening Complete  PCP or Specialist Appt within 3-5 Days Complete  HRI or [place] Complete  Social Work Consult for [place] Planning/Counseling Complete  Palliative Care Screening Complete  Medication Review Press photographer) Complete  Some recent data might be hidden

## 2019-01-05 NOTE — Progress Notes (Signed)
[name] [place] NAME: Sylvia [name]    MR#:  [id number]  DATE OF BIRTH:  1955/06/11  Subjective:   Patient is doing well this morning.  She has not had any additional signs of bleeding.  She overall feels well and is looking forward to being able to go home.  REVIEW OF SYSTEMS:  CONSTITUTIONAL: No fever, fatigue or weakness.  EYES: No blurred or double vision.  EARS, NOSE, AND THROAT: No tinnitus or ear pain.  RESPIRATORY: No cough, shortness of breath, wheezing or hemoptysis.  CARDIOVASCULAR: No chest pain, orthopnea, edema.  GASTROINTESTINAL: No nausea or vomiting.  GENITOURINARY: No dysuria, hematuria.  ENDOCRINE: No polyuria, nocturia,  HEMATOLOGY: No bleeding, no bruising MUSCULOSKELETAL:  + Mild right stump pain  PSYCHIATRY: No anxiety or depression.   DRUG ALLERGIES:   Allergies  Allergen Reactions  . Penicillins Hives    Has patient had a PCN reaction causing immediate rash, facial/tongue/throat swelling, SOB or lightheadedness with hypotension: Yes Has patient had a PCN reaction causing severe rash involving mucus membranes or skin necrosis: No Has patient had a PCN reaction that required hospitalization: No Has patient had a PCN reaction occurring within the last 10 years: No If all of the above answers are "NO", then may proceed with Cephalosporin use.  . Tramadol Itching    VITALS:  Blood pressure 120/73, pulse (!) 102, temperature 98.6 F (37 C), temperature source Oral, resp. rate 20, height 5\' 7"  (1.702 m), weight 62.7 kg, SpO2 98 %.  PHYSICAL EXAMINATION:  GENERAL:  63 y.o.-year-old patient lying in the bed with no acute distress.  EYES: Pupils equal, round, reactive to light and accommodation. No scleral icterus. Extraocular muscles intact.  HEENT: Head atraumatic, normocephalic. Oropharynx and nasopharynx clear.  NECK:  Supple, no jugular venous distention. No thyroid enlargement, no tenderness.  LUNGS:  Clear to auscultation with occasional basilar crepitations present CARDIOVASCULAR: RRR, S1, S2. No murmurs, rubs, or gallops.  ABDOMEN: Soft, nontender, nondistended. Bowel sounds present. No organomegaly or mass.  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  nEUROLOGIC: Cranial nerves II through XII are intact. + Global weakness. Sensation intact. Gait not checked.  PSYCHIATRIC: The patient is alert and oriented x 3.  SKIN: R BKA with dressing in place LABORATORY PANEL:   CBC Recent Labs  Lab 01/05/19 0457  WBC 16.6*  HGB 9.3*  HCT 27.2*  PLT 762*   ------------------------------------------------------------------------------------------------------------------  Chemistries  Recent Labs  Lab 12/31/18 0427  01/05/19 0457  NA 139   < > 138  K 3.3*   < > 4.0  CL 106   < > 104  CO2 25   < > 27  GLUCOSE 118*   < > 148*  BUN 15   < > 24*  CREATININE 1.05*   < > 1.04*  CALCIUM 8.4*   < > 9.4  MG 1.8  --   --    < > = values in this interval not displayed.   ------------------------------------------------------------------------------------------------------------------  Cardiac Enzymes No results for input(s): TROPONINI in the last 168 hours. ------------------------------------------------------------------------------------------------------------------  RADIOLOGY:  No results found.  ASSESSMENT AND PLAN:   GI bleed secondary to gastric ulcer- bleeding has resolved and hemoglobin is stable. -EGD 9/16 with non-bleeding ulcer -Anemia panel unremarkable -Aspirin restarted yesterday, will restart Eliquis today -Continue protonix po bid  Severe PAD- s/p multiple RLE endovascular interventions, now with right BKA.  Pain is improving. -Vascular surgery following -Continue  liptor -Continue aspirin -Eliquis restarted today -PT recommending home health  Leukocytosis- WBC elevated but stable. Likely reactive. No fevers or signs of infection. Stump site looks  good per vascular. -Monitor  Hypertension- BP much improved. Creatinine is stable after starting lisinopril. -Continue metoprolol 50mg  bid -Lisinopril 5mg  daily started this admission -Hydralazine IV prn  Uncontrolled type 2 diabetes -Continue home Tradjenta -Continue SSI   All the records are reviewed and case discussed with Care Management/Social Workerr. Management plans discussed with the patient, family and they are in agreement.  CODE STATUS: Full  TOTAL TIME TAKING CARE OF THIS PATIENT: 35 minutes.   More than 50% time spent in counseling, coordination of care   POSSIBLE D/C tomorrow, DEPENDING ON CLINICAL CONDITION.   Eugina Row M.D on 01/05/2019   Between 7am to 6pm - Pager - [phone]  After 6pm go to [email or web address] - 01/07/2019  [facility]  Office  [phone]  CC: Primary care physician; Center, Social research officer, government [facility]  Note: This dictation was prepared with [phone] dictation along with smaller phrase technology. Any transcriptional errors that result from this process are unintentional.

## 2019-01-05 NOTE — Progress Notes (Signed)
[facility] Daily Progress Note   Subjective: 12/27/18: Right Below The Knee Amputation  12/19/18: 1.Ultrasound guidance for vascular accessleftfemoral artery 2.Catheter placement into right common femoral artery from left femoral approach 3.Aortogram and selectiverightlower extremity angiogram 4.Mechanical thrombectomy with penumbra CAT 6 device to the right SFA, popliteal artery, tibioperoneal trunk, and proximal posterior tibial arteries 5.Percutaneous transluminal angioplasty of the right tibioperoneal trunk and posterior tibial artery with 3 mm diameter by 22 cm length angioplasty balloon 6.Percutaneous transluminal angioplasty of the right popliteal artery with 4 mm diameter by 22 cm length [name] 7.Percutaneous transluminal angioplasty of the right SFA with 5 mm diameter by 30 cm length [name] 8.Placement of 8 mg of TPA to the right SFA, popliteal artery, and tibioperoneal trunk as well as placement of an infusion catheter for continuous thrombolytic therapy following the procedure using a 130 cm total length 50 cm working length lysis catheter  With take back to endovascular suite the same day for:  1.RLE angiogram 2.Catheter placement into right peroneal artery from left femoral approach 3.Mechanical thrombectomy to the right SFA, popliteal artery, tibioperoneal trunk, and peroneal artery 4.Percutaneous transluminal angioplasty ofthe right peroneal artery and tibioperoneal trunk with 2.5 mm diameter angioplasty balloon 5.Percutaneous transluminal angioplasty of the right popliteal artery with 4 mm diameter [name] 6.Percutaneous transluminal angioplasty of the  right proximal SFA with 5 mm diameter [name] 7.Viabahnstent placement to the right popliteal artery with 5 mm diameter by 10 cm length stent 8.StarClose closure device leftfemoral artery  Patient without complaint this AM.  Objective: Vitals:   01/04/19 1456 01/04/19 2115 01/05/19 0042 01/05/19 0444  BP:  127/76 (!) 148/72 120/73  Pulse:  85 76 78  Resp:   20 20  Temp:  98.6 F (37 C) 98.8 F (37.1 C) 98.6 F (37 C)  TempSrc:  Oral Oral Oral  SpO2:  99% 97% 99%  Weight: 62.7 kg     Height:        Intake/Output Summary (Last 24 hours) at 01/05/2019 1032 Last data filed at 01/05/2019 0900 Gross per 24 hour  Intake 840 ml  Output --  Net 840 ml   Physical Exam: A&Ox3, NAD CV: RRR Pulmonary: CTA Bilaterally Abdomen: Soft, Nontender, Nondistended Vascular:             Right Lower Extremity Thigh soft. Knee flexible at the joint. Stump healthy. Staple line clean and dry.   Laboratory: CBC    Component Value Date/Time   WBC 16.6 (H) 01/05/2019 0457   HGB 9.3 (L) 01/05/2019 0457   HCT 27.2 (L) 01/05/2019 0457   PLT 762 (H) 01/05/2019 0457   BMET    Component Value Date/Time   NA 138 01/05/2019 0457   K 4.0 01/05/2019 0457   CL 104 01/05/2019 0457   CO2 27 01/05/2019 0457   GLUCOSE 148 (H) 01/05/2019 0457   BUN 24 (H) 01/05/2019 0457   CREATININE 1.04 (H) 01/05/2019 0457   CALCIUM 9.4 01/05/2019 0457   GFRNONAA 57 (L) 01/05/2019 0457   GFRAA >60 01/05/2019 0457   Assessment/Planning: The patient is a 63 year old female with multiple medical issues including peripheral artery disease requiring multiple endovascular interventions now iss/p R BKA 1) OR dressing removed. Stump healthy, clean and dry 2) OK with Eliquis 2.5mg  BID and ASA 3) OK to discharge from vascular standpoint when patient is medically stable.  Discussed with Dr. Ellis Parents [facility] PA-C  01/05/2019 10:32 AM

## 2019-01-05 NOTE — Consult Note (Signed)
ANTICOAGULATION CONSULT NOTE  Pharmacy Consult for apixaban dosing Indication: PAD  Patient Measurements: Height: 5\' 7"  (170.2 cm) Weight: 138 lb 3.7 oz (62.7 kg) IBW/kg (Calculated) : 61.6  Vital Signs: Temp: 98.6 F (37 C) (09/18 0444) Temp Source: Oral (09/18 0444) BP: 120/73 (09/18 0444) Pulse Rate: 78 (09/18 0444)  Labs: Recent Labs    01/03/19 0553 01/04/19 0548 01/05/19 0457  HGB 8.6* 8.9* 9.3*  HCT 25.9* 26.3* 27.2*  PLT 752* 727* 762*  CREATININE 1.07* 1.09* 1.04*    Estimated Creatinine Clearance: 53.1 mL/min (A) (by C-G formula based on SCr of 1.04 mg/dL (H)).   Medical History: Past Medical History:  Diagnosis Date  . Anxiety    h/o  . COPD (chronic obstructive pulmonary disease) ([place])   . Diabetes mellitus without complication ([place])   . GERD (gastroesophageal reflux disease)   . Hypertension    bp under control-off meds since 2019    Medications:  Scheduled:  . aspirin EC  81 mg Oral Daily  . atorvastatin  40 mg Oral Daily  . docusate sodium  100 mg Oral BID  . influenza vac split quadrivalent PF  0.5 mL Intramuscular Tomorrow-1000  . insulin aspart  0-5 Units Subcutaneous QHS  . insulin aspart  0-9 Units Subcutaneous TID WC  . iron polysaccharides  150 mg Oral Daily  . linagliptin  5 mg Oral Daily  . lisinopril  5 mg Oral Daily  . metoCLOPramide  5 mg Oral TID AC  . metoprolol tartrate  50 mg Oral BID  . multivitamin with minerals  1 tablet Oral Daily  . pantoprazole  40 mg Oral BID AC  . polyethylene glycol  17 g Oral Daily  . Ensure Max Protein  11 oz Oral BID  . senna  2 tablet Oral Daily  . sodium chloride flush  10-40 mL Intracatheter Q12H  . sodium chloride flush  3 mL Intravenous Q12H  . vitamin C  250 mg Oral BID    Assessment: 63 y.o. female presenting to hospital 12/14/18 with abdominal pain, nausea, vomiting, and diarrhea. During hospital stay patient recieved a R BKA on 12/27/18. Long term anticoagulation is recommended by  vascular surgery to keep her left leg interventions intact. This patient's BMI is 21.7 with recent bleeding issues, H&H, PLT trending up     Goal of Therapy:  Monitor platelets by anticoagulation protocol: Yes   Plan:   I discussed the appropriate dose of apixaban with Dr Lucky Cowboy and Jerel Shepherd, PAC: thanks kindly for your advice  Begin apixaban 2.5 mg tice daily  CBC at least every 3 days while inpatient  Dallie Piles, PharmD 01/05/2019,8:38 AM

## 2019-01-05 NOTE — Progress Notes (Addendum)
Physical Therapy Treatment Patient Details Name: Sylvia [name] MRN: [id number] DOB: 1955-11-05 Today's Date: 01/05/2019    History of Present Illness Pt is a 63 y.o. female presenting to hospital 12/14/18 with abdominal pain, nausea, vomiting, and diarrhea. 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  S/p angio 8/31 R LE d/t PAD with resting pain B LE's.  PMH includes htn, DM, PAD, anxiety, COPD, h/o DVT.    PT Comments    Author reviewed and updated HEP in session, education on time in prone as well to prevent loss of hip extension over the coming months as healing progresses. Pt does well in general, no obvious limitations from pain, large;ly 3/10 throughout session. Pt's updates to HEP will be issues with an updated handout this date. AMB progressed to 56ft, slow and requiring some intermittent standing rest, but balance is well maintained without author's intervention.   Quad set: 10x3sec SLR: 15x1 Marching (BKR) 15x1 Right hip ABDCT in Left SL  x10 (modA block of pelvis) SL Rt hip extension x10 (modA block of pelvis) Tummy time to prone on elbows x2 minutes  Prone knee flexion x10  Prone Straight leg raise 5x bilat    Follow Up Recommendations  Home health PT;Supervision/Assistance - 24 hour     Equipment Recommendations  Rolling walker with 5" wheels;3in1 (PT);Wheelchair (measurements PT);Wheelchair cushion (measurements PT)    Recommendations for Other Services       Precautions / Restrictions Precautions Precautions: Fall Precaution Comments: Rt BKA Restrictions Weight Bearing Restrictions: No RLE Weight Bearing: Non weight bearing    Mobility  Bed Mobility Overal bed mobility: Modified Independent Bed Mobility: Supine to Sit;Rolling     Supine to sit: Modified independent (Device/Increase time)        Transfers Overall transfer  level: Needs assistance Equipment used: Rolling walker (2 wheeled) Transfers: Sit to/from Stand Sit to Stand: Supervision            Ambulation/Gait Ambulation/Gait assistance: Supervision Gait Distance (Feet): 52 Feet Assistive device: Rolling walker (2 wheeled)       General Gait Details: hop to gait pattern with R LE NWB'ing;   Stairs             Wheelchair Mobility    Modified Rankin (Stroke Patients Only)       Balance                                            Cognition Arousal/Alertness: Awake/alert Behavior During Therapy: WFL for tasks assessed/performed Overall Cognitive Status: Within Functional Limits for tasks assessed                                        Exercises      General Comments        Pertinent Vitals/Pain Pain Assessment: 0-10 Pain Score: 3  Pain Location: residual limb Pain Descriptors / Indicators: Aching;Sore Pain Intervention(s): Limited activity within patient's tolerance;Monitored during session;Premedicated before session;Repositioned    Home Living                      Prior Function            PT  Goals (current goals can now be found in the care plan section) Acute Rehab PT Goals Patient Stated Goal: to go home and take care of myself again PT Goal Formulation: With patient Time For Goal Achievement: 01/13/19 Potential to Achieve Goals: Good Progress towards PT goals: Progressing toward goals    Frequency    7X/week      PT Plan Current plan remains appropriate    Co-evaluation              AM-PAC PT "6 Clicks" Mobility   Outcome Measure  Help needed turning from your back to your side while in a flat bed without using bedrails?: A Little Help needed moving from lying on your back to sitting on the side of a flat bed without using bedrails?: A Little Help needed moving to and from a bed to a chair (including a wheelchair)?: A Little Help needed  standing up from a chair using your arms (e.g., wheelchair or bedside chair)?: A Little Help needed to walk in hospital room?: A Little Help needed climbing 3-5 steps with a railing? : A Little 6 Click Score: 18    End of Session Equipment Utilized During Treatment: Gait belt Activity Tolerance: Patient tolerated treatment well Patient left: in chair;with call bell/phone within reach;with chair alarm set;with family/visitor present Nurse Communication: Mobility status PT Visit Diagnosis: Other abnormalities of gait and mobility (R26.89);Muscle weakness (generalized) (M62.81);Difficulty in walking, not elsewhere classified (R26.2);Pain;Unsteadiness on feet (R26.81) Pain - Right/Left: Right Pain - part of body: Leg     Time: 2423-5361 PT Time Calculation (min) (ACUTE ONLY): 31 min  Charges:  $Gait Training: 8-22 mins $Therapeutic Exercise: 8-22 mins                     12:03 PM, 01/05/19 Sylvia [name], PT, DPT Physical Therapist - [facility]  [phone] (ASCOM)    Sylvia [name] 01/05/2019, 12:00 PM

## 2019-01-05 NOTE — Progress Notes (Signed)
Occupational Therapy Treatment Patient Details Name: Sylvia [name] MRN: [id number] DOB: 1956/03/16 Today's Date: 01/05/2019    History of present illness Pt is a 63 y.o. female presenting to hospital 12/14/18 with abdominal pain, nausea, vomiting, and diarrhea. 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  S/p angio 8/31 R LE d/t PAD with resting pain B LE's.  PMH includes htn, DM, PAD, anxiety, COPD, h/o DVT.   OT comments  Pt seen for OT tx this date. Pt eager to participate and wanted to ambulate to the bathroom to attempt to have BM. Pt instructed in functional transfers and RW mgt from recliner to/from bathroom for toileting task, occasional cues for RW mgt across thresholds and prior to transfers. Sup-CGA for transfers, mod indep for pericare with lateral lean. Pt progressing well towards goals. Continues to benefit from skilled OT Services to maximize return to PLOF.   Follow Up Recommendations  Home health OT    Equipment Recommendations  3 in 1 bedside commode    Recommendations for Other Services      Precautions / Restrictions Precautions Precautions: Fall Precaution Comments: Rt BKA Restrictions Weight Bearing Restrictions: Yes RLE Weight Bearing: Non weight bearing       Mobility Bed Mobility Overal bed mobility: Modified Independent Bed Mobility: Supine to Sit;Rolling     Supine to sit: Modified independent (Device/Increase time)     General bed mobility comments: deferred, up in recliner at start and end of session  Transfers Overall transfer level: Needs assistance Equipment used: Rolling walker (2 wheeled) Transfers: Sit to/from Stand Sit to Stand: Supervision;Min guard              Balance Overall balance assessment: Needs assistance Sitting-balance support: No upper extremity supported Sitting balance-Leahy Scale:  Good Sitting balance - Comments: steady sitting reaching within BOS   Standing balance support: Bilateral upper extremity supported Standing balance-Leahy Scale: Fair                             ADL either performed or assessed with clinical judgement   ADL Overall ADL's : Needs assistance/impaired                         Toilet Transfer: Supervision/safety;Min Press photographer Details (indicate cue type and reason): with BSC over toilet, sup-CGA for toilet transfer with VC for RW placement prior to descent Toileting- Clothing Manipulation and Hygiene: Modified independent;Sitting/lateral lean               Vision Patient Visual Report: No change from baseline     Perception     Praxis      Cognition Arousal/Alertness: Awake/alert Behavior During Therapy: WFL for tasks assessed/performed Overall Cognitive Status: Within Functional Limits for tasks assessed                                          Exercises Other Exercises Other Exercises: functional transfers and mobility with RW from recliner to/from bathroom for toileting task, occasional cues for RW mgt across thresholds and prior to transfers   Shoulder Instructions       General Comments RLE residual limb bandage in place; pt had BM while in the bathroom, RN/nurse  tech notified (formed, med-dark brown in color)    Pertinent Vitals/ Pain       Pain Assessment: Faces Pain Score: 3  Faces Pain Scale: Hurts a little bit Pain Location: residual limb with functional mobility Pain Descriptors / Indicators: Aching;Sore Pain Intervention(s): Limited activity within patient's tolerance;Monitored during session;Repositioned  Home Living                                          Prior Functioning/Environment              Frequency  Min 2X/week        Progress Toward Goals  OT Goals(current goals can now be found in the care  plan section)  Progress towards OT goals: Progressing toward goals  Acute Rehab OT Goals Patient Stated Goal: to go home and take care of myself again OT Goal Formulation: With patient Time For Goal Achievement: 01/12/19 Potential to Achieve Goals: Good  Plan Discharge plan remains appropriate;Frequency remains appropriate    Co-evaluation                 AM-PAC OT "6 Clicks" Daily Activity     Outcome Measure   Help from another person eating meals?: None Help from another person taking care of personal grooming?: None Help from another person toileting, which includes using toliet, bedpan, or urinal?: A Little Help from another person bathing (including washing, rinsing, drying)?: A Little Help from another person to put on and taking off regular upper body clothing?: None Help from another person to put on and taking off regular lower body clothing?: A Little 6 Click Score: 21    End of Session Equipment Utilized During Treatment: Gait belt;Rolling walker  OT Visit Diagnosis: Pain;Other abnormalities of gait and mobility (R26.89) Pain - Right/Left: Right Pain - part of body: Leg   Activity Tolerance Patient tolerated treatment well   Patient Left in chair;with call bell/phone within reach;with chair alarm set;with family/visitor present   Nurse Communication          Time: 1610-9604 OT Time Calculation (min): 23 min  Charges: OT General Charges $OT Visit: 1 Visit OT Treatments $Self Care/Home Management : 23-37 mins  Jeni Salles, MPH, MS, OTR/L ascom [phone] 01/05/19, 2:20 PM

## 2019-01-06 LAB — GLUCOSE, CAPILLARY: Glucose-Capillary: 156 mg/dL — ABNORMAL HIGH (ref 70–99)

## 2019-01-06 LAB — CBC
HCT: 26.7 % — ABNORMAL LOW (ref 36.0–46.0)
Hemoglobin: 8.9 g/dL — ABNORMAL LOW (ref 12.0–15.0)
MCH: 28.2 pg (ref 26.0–34.0)
MCHC: 33.3 g/dL (ref 30.0–36.0)
MCV: 84.5 fL (ref 80.0–100.0)
Platelets: 678 10*3/uL — ABNORMAL HIGH (ref 150–400)
RBC: 3.16 MIL/uL — ABNORMAL LOW (ref 3.87–5.11)
RDW: 14 % (ref 11.5–15.5)
WBC: 14.3 10*3/uL — ABNORMAL HIGH (ref 4.0–10.5)
nRBC: 0 % (ref 0.0–0.2)

## 2019-01-06 MED ORDER — ATORVASTATIN CALCIUM 40 MG PO TABS: 40.0000 mg | ORAL_TABLET | Freq: Every day | ORAL | 0 refills | Status: DC

## 2019-01-06 MED ORDER — OXYCODONE HCL 5 MG PO TABS: 5.0000 mg | ORAL_TABLET | ORAL | 0 refills | Status: DC | PRN

## 2019-01-06 NOTE — TOC Transition Note (Signed)
Transition of Care [facility]) - CM/SW Discharge Note   Patient Details  Name: Sylvia [name] MRN: [id number] Date of Birth: 02/26/1956  Transition of Care [facility]) CM/SW Contact:  Geralynn Ochs, LCSW Phone Number: 01/06/2019, 9:40 AM   Clinical Narrative:   Patient to be discharged home today. Patient's medications have already been obtained, RN to ensure that patient leaves with medications. CSW alerted [facility] that patient was discharging today, home health orders in place for PT and RN. No further needs identified at this time.    Final next level of care: [place] Barriers to Discharge: Barriers Resolved   Patient Goals and CMS Choice Patient states their goals for this hospitalization and ongoing recovery are:: To return back home with home health. CMS Medicare.gov Compare Post Acute Care list provided to:: Patient Choice offered to / list presented to : Patient  Discharge Placement                       Discharge Plan and Services In-house Referral: Clinical Social Work   Post Acute Care Choice: Home Health                    HH Arranged: RN, PT [facility] Agency: [facility] Date [place]: 01/06/19 Time [place]: [phone] Representative spoke with at [place]: [place] ([place]) Interventions     Readmission Risk Interventions Readmission Risk Prevention Plan 12/30/2018  Transportation Screening Complete  PCP or Specialist Appt within 3-5 Days Complete  HRI or [place] Complete  Social Work Consult for [place] Planning/Counseling Complete  Palliative Care Screening Complete  Medication Review Press photographer) Complete  Some recent data might be hidden

## 2019-01-06 NOTE — TOC Transition Note (Signed)
Transition of Care [facility]) - CM/SW Discharge Note   Patient Details  Name: Sylvia [name] MRN: [id number] Date of Birth: 06-04-1955  Transition of [facility] CM/SW Contact:  Shelbie Hutching, RN Phone Number: 01/06/2019, 12:21 PM   Clinical Narrative:    RNCM arranged medications from Medication Management clinic yesterday, they were filled yesterday and provided to the patient today at discharge.  The only prescription that was not filled was for atorvastatin.  The atorvastatin is available at [facility] for $15, patient reports that this is affordable and she can get the prescription transferred over to [facility] at a later time since they are closed on the weekend.      Final next level of care: [place] Barriers to Discharge: Barriers Resolved   Patient Goals and CMS Choice Patient states their goals for this hospitalization and ongoing recovery are:: To return back home with home health. CMS Medicare.gov Compare Post Acute Care list provided to:: Patient Choice offered to / list presented to : Patient  Discharge Placement                       Discharge Plan and Services In-house Referral: Clinical Social Work   Post Acute Care Choice: Home Health                    HH Arranged: RN, PT [facility] Agency: [facility] Date [place]: 01/06/19 Time [place]: [phone] Representative spoke with at [place]: [place] ([place]) Interventions     Readmission Risk Interventions Readmission Risk Prevention Plan 12/30/2018  Transportation Screening Complete  PCP or Specialist Appt within 3-5 Days Complete  HRI or [place] Complete  Social Work Consult for [place] Planning/Counseling Complete  Palliative Care Screening Complete  Medication Review Press photographer) Complete  Some recent data might be hidden

## 2019-01-06 NOTE — Progress Notes (Signed)
[name] to be D/C'd Home per MD order.  Discussed prescriptions and follow up appointments with the patient. Prescriptions given to patient, medication list explained in detail. Pt verbalized understanding.  Allergies as of 01/06/2019      Reactions   Penicillins Hives   Has patient had a PCN reaction causing immediate rash, facial/tongue/throat swelling, SOB or lightheadedness with hypotension: Yes Has patient had a PCN reaction causing severe rash involving mucus membranes or skin necrosis: No Has patient had a PCN reaction that required hospitalization: No Has patient had a PCN reaction occurring within the last 10 years: No If all of the above answers are "NO", then may proceed with Cephalosporin use.   Tramadol Itching      Medication List    STOP taking these medications   warfarin 5 MG tablet Commonly known as: Coumadin     TAKE these medications   albuterol 108 (90 Base) MCG/ACT inhaler Commonly known as: VENTOLIN HFA Inhale 2 puffs into the lungs every 6 (six) hours as needed for wheezing or shortness of breath.   alum & mag hydroxide-simeth 200-200-20 MG/5ML suspension Commonly known as: MAALOX/MYLANTA Take 30 mLs by mouth every 4 (four) hours as needed for indigestion or heartburn.   apixaban 2.5 MG Tabs tablet Commonly known as: ELIQUIS Take 1 tablet (2.5 mg total) by mouth 2 (two) times daily.   aspirin 81 MG EC tablet Take 1 tablet (81 mg total) by mouth daily.   atorvastatin 40 MG tablet Commonly known as: LIPITOR Take 1 tablet (40 mg total) by mouth daily. What changed:   medication strength  how much to take   Biotin 1000 MCG tablet Take 1 tablet (1 mg total) by mouth daily. What changed: how much to take   cetirizine 10 MG tablet Commonly known as: ZYRTEC Take 10 mg by mouth daily.   cholecalciferol 25 MCG (1000 UT) tablet Commonly known as: VITAMIN D3 Take 1,000 Units by mouth daily.   iron polysaccharides 150 MG capsule Commonly  known as: NIFEREX Take 1 capsule (150 mg total) by mouth daily.   lisinopril 5 MG tablet Commonly known as: ZESTRIL Take 1 tablet (5 mg total) by mouth daily.   metoprolol tartrate 50 MG tablet Commonly known as: LOPRESSOR Take 1 tablet (50 mg total) by mouth 2 (two) times daily.   oxyCODONE 5 MG immediate release tablet Commonly known as: Oxy IR/ROXICODONE Take 1 tablet (5 mg total) by mouth every 4 (four) hours as needed for moderate pain.   pantoprazole 40 MG tablet Commonly known as: PROTONIX Take 1 tablet (40 mg total) by mouth daily.   sitaGLIPtin 100 MG tablet Commonly known as: JANUVIA Take 100 mg by mouth daily.   vitamin C 500 MG tablet Commonly known as: ASCORBIC ACID Take 500 mg by mouth daily.   WOMENS MULTIVITAMIN PO Take 1 tablet by mouth daily.            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  A cane, crutch or walker will not resolve issue with performing activities of daily living. A wheelchair will allow patient to safely perform daily activities. Patient can safely propel  the wheelchair in the home or has a caregiver who can provide assistance. Length of need Lifetime. Accessories: elevating leg rests (ELRs), wheel locks, extensions and anti-tippers.   12/31/18 1225   12/31/18 1225  For home use only DME Walker rolling  Once    Question:  Patient needs a walker to treat with the following condition  Answer:  S/P BKA (below knee amputation), right ([place])   12/31/18 1225   12/31/18 1225  For home use only DME 3 n 1  Once     12/31/18 1225          Vitals:   01/05/19 2029 01/06/19 0422  BP: 133/68 131/66  Pulse: 79 76  Resp: 20 20  Temp: 99.1 F (37.3 C)  98.7 F (37.1 C)  SpO2: 100% 99%    Skin clean, dry and intact without evidence of skin break down, no evidence of skin tears noted. IV catheter discontinued intact. Site without signs and symptoms of complications. Dressing and pressure applied. Pt denies pain at this time. No complaints noted.  An After Visit Summary was printed and given to the patient. Patient escorted via [place], and D/C home via private auto.  Fuller Mandril, RN

## 2019-01-06 NOTE — Discharge Instructions (Signed)
It was so nice to meet you during this hospitalization!  You came into the hospital with leg pain. This was because you weren't getting good blood flow to your leg. You ended up having to get an amputation. We put you on blood thinners, but then you had some bleeding from an ulcer in your stomach.  I have prescribed the following medications: 1. Please take aspirin 81mg  daily 2. Please take eliquis 2.5mg  twice a day 3. We have increased your lipitor dose from 10mg  daily to 40mg  daily 4. Please take lisinopril 5mg  daily- this is a blood pressure medicine 5. Please take lopressor 50mg  twice daily- this is another blood pressure medicine  Take care, Dr. Brett Albino   Vascular Surgery Discharge Instructions 1) Change dressing daily 2) Cover staple line with abdominal pad 3) Cover with Kerlix 4) Cover with Ace Bandage

## 2019-01-06 NOTE — Discharge Summary (Signed)
[facility] [employer] [facility] [place] [facility]   PATIENT NAME: Sylvia SensingRoberta [name]    MR#:  [id number]  DATE OF BIRTH:  05/28/1955  DATE OF ADMISSION:  12/14/2018   ADMITTING PHYSICIAN: Hannah BeatJan A Mansy, MD  DATE OF DISCHARGE: 01/06/2019  PRIMARY CARE PHYSICIAN: [facility]   ADMISSION DIAGNOSIS:  Generalized abdominal pain [R10.84] PAD (peripheral artery disease) (HCC) [I73.9] Type 2 diabetes mellitus with ketoacidosis without coma, without long-term current use of insulin (HCC) [E11.10] DKA (diabetic ketoacidoses) (HCC) [E11.10] DISCHARGE DIAGNOSIS:  Active Problems:   DKA (diabetic ketoacidoses) (HCC)   Blood in stool   DU (duodenal ulcer)  SECONDARY DIAGNOSIS:   Past Medical History:  Diagnosis Date  . Anxiety    h/o  . COPD (chronic obstructive pulmonary disease) (HCC)   . Diabetes mellitus without complication (HCC)   . GERD (gastroesophageal reflux disease)   . Hypertension    bp under control-off meds since 2019   HOSPITAL COURSE:   Sylvia [name] is a 63 year old female who presented to the ED with nausea, vomiting, and diarrhea.  She also noted that her blood sugars were high at home.  In the ED, she was found to be in DKA.  She was admitted for further management.  Uncontrolled type 2 diabetes- initially with DKA, but this resolved with insulin drip -Patient was transitioned to subcutaneous insulin without any issues -Home diabetes medicines were continued on discharge  Severe PAD- patient had significant bilateral lower extremity claudication symptoms on admission.  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 -She underwent multiple RLE endovascular interventions, without success  -s/p right BKA on 12/27/18 -Continue Lipitor, aspirin, Eliquis -Patient will need to follow-up with vascular surgery as an outpatient -PT recommended home health-this was ordered for patient prior to discharge  GI bleed secondary to gastric  ulcer- after being started on Eliquis by vascular surgery, patient developed melena and had acute blood loss anemia. -Patient received 4 units PRBC this admission -EGD 9/16 with non-bleeding ulcer -After patient's EGD, she was restarted on aspirin and then was restarted on Eliquis the next day -Hemoglobin was stable for 24 hours after restarting both aspirin and Eliquis -Anemia panel unremarkable -Prescribed PPI twice daily on discharge -Will need to follow-up with gastroenterology in 2 weeks  Leukocytosis- WBC elevated but stable. Likely reactive. No fevers or signs of infection. Stump site looks good per vascular. -Leukocytosis improved on the day of discharge -Patient will need CBC rechecked as an outpatient  Hypertension- BP much improved.  -Continued metoprolol 50mg  bid -Lisinopril 5mg  daily started this admission   DISCHARGE CONDITIONS:  Uncontrolled type 2 diabetes Severe PAD s/p right BKA GI bleed secondary to gastric ulcer Leukocytosis Hypertension CONSULTS OBTAINED:  Vascular surgery Gastroenterology DRUG ALLERGIES:   Allergies  Allergen Reactions  . Penicillins Hives    Has patient had a PCN reaction causing immediate rash, facial/tongue/throat swelling, SOB or lightheadedness with hypotension: Yes Has patient had a PCN reaction causing severe rash involving mucus membranes or skin necrosis: No Has patient had a PCN reaction that required hospitalization: No Has patient had a PCN reaction occurring within the last 10 years: No If all of the above answers are "NO", then may proceed with Cephalosporin use.  . Tramadol Itching   DISCHARGE MEDICATIONS:   Allergies as of 01/06/2019      Reactions   Penicillins Hives   Has patient had a PCN reaction causing immediate rash, facial/tongue/throat swelling, SOB or lightheadedness with  hypotension: Yes Has patient had a PCN reaction causing severe rash involving mucus membranes or skin necrosis: No Has patient had a PCN  reaction that required hospitalization: No Has patient had a PCN reaction occurring within the last 10 years: No If all of the above answers are "NO", then may proceed with Cephalosporin use.   Tramadol Itching      Medication List    STOP taking these medications   warfarin 5 MG tablet Commonly known as: Coumadin     TAKE these medications   albuterol 108 (90 Base) MCG/ACT inhaler Commonly known as: VENTOLIN HFA Inhale 2 puffs into the lungs every 6 (six) hours as needed for wheezing or shortness of breath.   alum & mag hydroxide-simeth 200-200-20 MG/5ML suspension Commonly known as: MAALOX/MYLANTA Take 30 mLs by mouth every 4 (four) hours as needed for indigestion or heartburn.   apixaban 2.5 MG Tabs tablet Commonly known as: ELIQUIS Take 1 tablet (2.5 mg total) by mouth 2 (two) times daily.   aspirin 81 MG EC tablet Take 1 tablet (81 mg total) by mouth daily.   atorvastatin 40 MG tablet Commonly known as: LIPITOR Take 1 tablet (40 mg total) by mouth daily. What changed:  medication strength how much to take   Biotin 1000 MCG tablet Take 1 tablet (1 mg total) by mouth daily. What changed: how much to take   cetirizine 10 MG tablet Commonly known as: ZYRTEC Take 10 mg by mouth daily.   cholecalciferol 25 MCG (1000 UT) tablet Commonly known as: VITAMIN D3 Take 1,000 Units by mouth daily.   iron polysaccharides 150 MG capsule Commonly known as: NIFEREX Take 1 capsule (150 mg total) by mouth daily.   lisinopril 5 MG tablet Commonly known as: ZESTRIL Take 1 tablet (5 mg total) by mouth daily.   metoprolol tartrate 50 MG tablet Commonly known as: LOPRESSOR Take 1 tablet (50 mg total) by mouth 2 (two) times daily.   oxyCODONE 5 MG immediate release tablet Commonly known as: Oxy IR/ROXICODONE Take 1 tablet (5 mg total) by mouth every 4 (four) hours as needed for moderate pain.   pantoprazole 40 MG tablet Commonly known as: PROTONIX Take 1 tablet (40 mg  total) by mouth daily.   sitaGLIPtin 100 MG tablet Commonly known as: JANUVIA Take 100 mg by mouth daily.   vitamin C 500 MG tablet Commonly known as: ASCORBIC ACID Take 500 mg by mouth daily.   WOMENS MULTIVITAMIN PO Take 1 tablet by mouth daily.            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  A cane, crutch or walker will not resolve issue with performing activities of daily living. A wheelchair will allow patient to safely perform daily activities. Patient can safely propel the wheelchair in the home or has a caregiver who can provide assistance. Length of need Lifetime. Accessories: elevating leg rests (ELRs), wheel locks, extensions and anti-tippers.   12/31/18 1225   12/31/18 1225  For home use only DME Walker rolling  Once    Question:  Patient needs a walker to treat with the following condition  Answer:  S/P  BKA (below knee amputation), right ([place])   12/31/18 1225   12/31/18 1225  For home use only DME 3 n 1  Once     12/31/18 1225           DISCHARGE INSTRUCTIONS:  1.  Follow-up with PCP in 5 days 2.  Follow-up with vascular surgery in 3 weeks 3.  Follow-up with gastroenterology in 1-2 weeks 4.  Take aspirin, Eliquis, and Lipitor as prescribed 5.  Take PPI twice daily DIET:  Cardiac diet and Diabetic diet DISCHARGE CONDITION:  Stable ACTIVITY:  Activity as tolerated OXYGEN:  Home Oxygen: No.  Oxygen Delivery: room air DISCHARGE LOCATION:  home   If you experience worsening of your admission symptoms, develop shortness of breath, life threatening emergency, suicidal or homicidal thoughts you must seek medical attention immediately by calling 911 or calling your MD  immediately  if symptoms less severe.  You Must read complete instructions/literature along with all the possible adverse reactions/side effects for all the Medicines you take and that have been prescribed to you. Take any new Medicines after you have completely understood and accpet all the possible adverse reactions/side effects.   Please note  You were cared for by a hospitalist during your hospital stay. If you have any questions about your discharge medications or the care you received while you were in the hospital after you are discharged, you can call the unit and asked to speak with the hospitalist on call if the hospitalist that took care of you is not available. Once you are discharged, your primary care physician will handle any further medical issues. Please note that NO REFILLS for any discharge medications will be authorized once you are discharged, as it is imperative that you return to your primary care physician (or establish a relationship with a primary care physician if you do not have one) for your aftercare needs so that they can reassess your need for medications and monitor your lab values.    On the day of Discharge:  VITAL SIGNS:  Blood pressure 131/66, pulse 76, temperature 98.7 F (37.1 C), temperature source Oral, resp. rate 20, height 5\' 7"  (1.702 m), weight 62.7 kg, SpO2 99 %. PHYSICAL EXAMINATION:  GENERAL:  63 y.o.-year-old patient lying in the bed with no acute distress.  EYES: Pupils equal, round, reactive to light and accommodation. No scleral icterus. Extraocular muscles intact.  HEENT: Head atraumatic, normocephalic. Oropharynx and nasopharynx clear.  NECK:  Supple, no jugular venous distention. No thyroid enlargement, no tenderness.  LUNGS: Clear to auscultation with occasional basilar crepitations present CARDIOVASCULAR: RRR, S1, S2. No murmurs, rubs, or gallops.  ABDOMEN: Soft, nontender, nondistended. Bowel sounds present. No organomegaly or mass.   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  nEUROLOGIC: Cranial nerves II through XII are intact. + Global weakness. Sensation intact. Gait not checked.  PSYCHIATRIC: The patient is alert and oriented x 3.  SKIN: R BKA with dressing in place DATA REVIEW:   CBC Recent Labs  Lab 01/06/19 0512  WBC 14.3*  HGB 8.9*  HCT 26.7*  PLT 678*    Chemistries  Recent Labs  Lab 12/31/18 0427  01/05/19 0457  NA 139   < > 138  K 3.3*   < > 4.0  CL 106   < > 104  CO2 25   < > 27  GLUCOSE 118*   < > 148*  BUN 15   < > 24*  CREATININE 1.05*   < >  1.04*  CALCIUM 8.4*   < > 9.4  MG 1.8  --   --    < > = values in this interval not displayed.     Microbiology Results  Results for orders placed or performed during the hospital encounter of 12/14/18  SARS Coronavirus 2 [facility] order, Performed in [facility] hospital lab) Nasopharyngeal Nasopharyngeal Swab     Status: None   Collection Time: 12/14/18  3:11 PM   Specimen: Nasopharyngeal Swab  Result Value Ref Range Status   SARS Coronavirus 2 NEGATIVE NEGATIVE Final    Comment: (NOTE) If result is NEGATIVE SARS-CoV-2 target nucleic acids are NOT DETECTED. The SARS-CoV-2 RNA is generally detectable in upper and lower  respiratory specimens during the acute phase of infection. The lowest  concentration of SARS-CoV-2 viral copies this assay can detect is 250  copies / mL. A negative result does not preclude SARS-CoV-2 infection  and should not be used as the sole basis for treatment or other  patient management decisions.  A negative result may occur with  improper specimen collection / handling, submission of specimen other  than nasopharyngeal swab, presence of viral mutation(s) within the  areas targeted by this assay, and inadequate number of viral copies  (<250 copies / mL). A negative result must be combined with clinical  observations, patient history, and epidemiological information. If result is  POSITIVE SARS-CoV-2 target nucleic acids are DETECTED. The SARS-CoV-2 RNA is generally detectable in upper and lower  respiratory specimens dur ing the acute phase of infection.  Positive  results are indicative of active infection with SARS-CoV-2.  Clinical  correlation with patient history and other diagnostic information is  necessary to determine patient infection status.  Positive results do  not rule out bacterial infection or co-infection with other viruses. If result is PRESUMPTIVE POSTIVE SARS-CoV-2 nucleic acids MAY BE PRESENT.   A presumptive positive result was obtained on the submitted specimen  and confirmed on repeat testing.  While 2019 novel coronavirus  (SARS-CoV-2) nucleic acids may be present in the submitted sample  additional confirmatory testing may be necessary for epidemiological  and / or clinical management purposes  to differentiate between  SARS-CoV-2 and other Sarbecovirus currently known to infect humans.  If clinically indicated additional testing with an alternate test  methodology [phone]) is advised. The SARS-CoV-2 RNA is generally  detectable in upper and lower respiratory sp ecimens during the acute  phase of infection. The expected result is Negative. Fact Sheet for Patients:  BoilerBrush.com.cy Fact Sheet for Healthcare Providers: https://pope.com/ This test is not yet approved or cleared by the [place] FDA and has been authorized for detection and/or diagnosis of SARS-CoV-2 by FDA under an Emergency Use Authorization (EUA).  This EUA will remain in effect (meaning this test can be used) for the duration of the COVID-19 declaration under Section 564(b)(1) of the Act, 21 U.S.C. section 360bbb-3(b)(1), unless the authorization is terminated or revoked sooner. Performed at [facility], [address] [place], [place] [address]   CULTURE, BLOOD (ROUTINE X 2) w Reflex to ID Panel      Status: None   Collection Time: 12/23/18  3:22 PM   Specimen: BLOOD  Result Value Ref Range Status   Specimen Description BLOOD BLOOD LEFT HAND  Final   Special Requests   Final    BOTTLES DRAWN AEROBIC AND ANAEROBIC Blood Culture adequate volume   Culture   Final    NO GROWTH 5 DAYS Performed at  [facility], [address] [place], [place] [address]    Report Status 12/28/2018 FINAL  Final  CULTURE, BLOOD (ROUTINE X 2) w Reflex to ID Panel     Status: None   Collection Time: 12/23/18  5:05 PM   Specimen: BLOOD  Result Value Ref Range Status   Specimen Description BLOOD RT HAND  Final   Special Requests   Final    BOTTLES DRAWN AEROBIC AND ANAEROBIC Blood Culture adequate volume   Culture   Final    NO GROWTH 5 DAYS Performed at [facility], [address] [place], [place] [address]    Report Status 12/28/2018 FINAL  Final  MRSA PCR Screening     Status: None   Collection Time: 12/23/18  5:19 PM   Specimen: Nasal Mucosa; Nasopharyngeal  Result Value Ref Range Status   MRSA by PCR NEGATIVE NEGATIVE Final    Comment:        The GeneXpert MRSA Assay (FDA approved for NASAL specimens only), is one component of a comprehensive MRSA colonization surveillance program. It is not intended to diagnose MRSA infection nor to guide or monitor treatment for MRSA infections. Performed at [facility], [address] [place], [place] [address]     RADIOLOGY:  No results found.   Management plans discussed with the patient, family and they are in agreement.  CODE STATUS: Full Code   TOTAL TIME TAKING CARE OF THIS PATIENT: 40 minutes.    Jinny BlossomKaty D Pritesh Sobecki M.D on 01/06/2019 at 10:16 AM  Between 7am to 6pm - Pager - [phone]  After 6pm go to [email or web address] - Scientist, research (life sciences)password EPAS [facility]  [facility] [place] [facility]  Office  [phone]  CC: Primary care physician; [facility]   Note: This dictation was prepared  with Nurse, children'sDragon dictation along with smaller phrase technology. Any transcriptional errors that result from this process are unintentional.

## 2019-01-16 ENCOUNTER — Ambulatory Visit (INDEPENDENT_AMBULATORY_CARE_PROVIDER_SITE_OTHER): Payer: Medicaid Other

## 2019-01-16 ENCOUNTER — Encounter (INDEPENDENT_AMBULATORY_CARE_PROVIDER_SITE_OTHER): Payer: Self-pay | Admitting: Vascular Surgery

## 2019-01-16 ENCOUNTER — Other Ambulatory Visit: Payer: Self-pay

## 2019-01-16 ENCOUNTER — Ambulatory Visit (INDEPENDENT_AMBULATORY_CARE_PROVIDER_SITE_OTHER): Payer: Self-pay | Admitting: Vascular Surgery

## 2019-01-16 VITALS — BP 126/77 | HR 81 | Resp 12 | Ht 67.0 in | Wt 138.0 lb

## 2019-01-16 DIAGNOSIS — I739 Peripheral vascular disease, unspecified: Secondary | ICD-10-CM

## 2019-01-16 DIAGNOSIS — E1152 Type 2 diabetes mellitus with diabetic peripheral angiopathy with gangrene: Secondary | ICD-10-CM

## 2019-01-16 DIAGNOSIS — I10 Essential (primary) hypertension: Secondary | ICD-10-CM

## 2019-01-16 NOTE — Progress Notes (Signed)
Patient ID: Sylvia [name], female   DOB: [birth year], 63 y.o.   MRN: [id number]  Chief Complaint  Patient presents with  . Follow-up    HPI Sylvia [name] is a 63 y.o. female.  Patient returns in follow-up almost 3 weeks status post right below-knee amputation in about 4 weeks after staged bilateral lower extremity revascularizations.  She underwent multiple procedures on the right leg for attempted limb salvage, but ultimately had a nonsalvageable foot and proceeded with below-knee amputation.  She had some anemia and GI bleeding but has gotten this under control and is now back on her Eliquis for anticoagulation.  Her ABI on the left leg today is 1.0 with good waveforms consistent with successful revascularization.  Her left leg is currently doing well.  She is straightening her right knee well.  Her stump is healing well without any erythema or drainage.   Past Medical History:  Diagnosis Date  . Anxiety    h/o  . COPD (chronic obstructive pulmonary disease) ([place])   . Diabetes mellitus without complication ([place])   . GERD (gastroesophageal reflux disease)   . Hypertension    bp under control-off meds since 2019    Past Surgical History:  Procedure Laterality Date  . ABDOMINAL HYSTERECTOMY    . AMPUTATION Right 12/27/2018   Procedure: AMPUTATION BELOW KNEE;  Surgeon: Algernon Huxley, MD;  Location: [facility];  Service: General;  Laterality: Right;  . CHOLECYSTECTOMY    . ESOPHAGOGASTRODUODENOSCOPY (EGD) WITH PROPOFOL N/A 06/15/2018   Procedure: ESOPHAGOGASTRODUODENOSCOPY (EGD) WITH PROPOFOL;  Surgeon: Toledo, Benay Pike, MD;  Location: [facility];  Service: Gastroenterology;  Laterality: N/A;  . ESOPHAGOGASTRODUODENOSCOPY (EGD) WITH PROPOFOL N/A 01/03/2019   Procedure: ESOPHAGOGASTRODUODENOSCOPY (EGD) WITH PROPOFOL;  Surgeon: Lucilla Lame, MD;  Location: [facility];  Service: Endoscopy;  Laterality: N/A;  . LOWER EXTREMITY ANGIOGRAPHY Right 08/21/2018   Procedure: LOWER  EXTREMITY ANGIOGRAPHY;  Surgeon: Algernon Huxley, MD;  Location: [place] [facility];  Service: Cardiovascular;  Laterality: Right;  . LOWER EXTREMITY ANGIOGRAPHY Left 08/28/2018   Procedure: LOWER EXTREMITY ANGIOGRAPHY;  Surgeon: Algernon Huxley, MD;  Location: [place] [facility];  Service: Cardiovascular;  Laterality: Left;  . LOWER EXTREMITY ANGIOGRAPHY Right 08/28/2018   Procedure: Lower Extremity Angiography;  Surgeon: Algernon Huxley, MD;  Location: [place] [facility];  Service: Cardiovascular;  Laterality: Right;  . LOWER EXTREMITY ANGIOGRAPHY Right 12/18/2018   Procedure: Lower Extremity Angiography;  Surgeon: Algernon Huxley, MD;  Location: [place] [facility];  Service: Cardiovascular;  Laterality: Right;  . LOWER EXTREMITY ANGIOGRAPHY Right 12/18/2018   Procedure: Lower Extremity Angiography;  Surgeon: Algernon Huxley, MD;  Location: [place] [facility];  Service: Cardiovascular;  Laterality: Right;  . LOWER EXTREMITY ANGIOGRAPHY Left 12/21/2018   Procedure: Lower Extremity Angiography;  Surgeon: Algernon Huxley, MD;  Location: [place] [facility];  Service: Cardiovascular;  Laterality: Left;  . LOWER EXTREMITY ANGIOGRAPHY Right 12/21/2018   Procedure: Lower Extremity Angiography;  Surgeon: Algernon Huxley, MD;  Location: [place] [facility];  Service: Cardiovascular;  Laterality: Right;  . LOWER EXTREMITY INTERVENTION N/A 12/22/2018   Procedure: LOWER EXTREMITY INTERVENTION;  Surgeon: Algernon Huxley, MD;  Location: [place] [facility];  Service: Cardiovascular;  Laterality: N/A;      Allergies  Allergen Reactions  . Penicillins Hives    Has patient had a PCN reaction causing immediate rash, facial/tongue/throat swelling, SOB or lightheadedness with hypotension: Yes Has patient had a PCN reaction  causing severe rash involving mucus membranes or skin necrosis: No Has patient had a PCN reaction that required hospitalization: No Has patient had a PCN reaction occurring within the last 10 years: No If all  of the above answers are "NO", then may proceed with Cephalosporin use.  . Tramadol Itching    Current Outpatient Medications  Medication Sig Dispense Refill  . acetaminophen (TYLENOL) 325 MG tablet Take 650 mg by mouth every 6 (six) hours as needed.    Marland Kitchen albuterol (PROVENTIL HFA;VENTOLIN HFA) 108 (90 Base) MCG/ACT inhaler Inhale 2 puffs into the lungs every 6 (six) hours as needed for wheezing or shortness of breath. 1 Inhaler 2  . apixaban (ELIQUIS) 2.5 MG TABS tablet Take 1 tablet (2.5 mg total) by mouth 2 (two) times daily. 60 tablet 0  . aspirin EC 81 MG EC tablet Take 1 tablet (81 mg total) by mouth daily. 30 tablet 0  . atorvastatin (LIPITOR) 40 MG tablet Take 1 tablet (40 mg total) by mouth daily. 30 tablet 0  . docusate sodium (COLACE) 100 MG capsule Take 100 mg by mouth 2 (two) times daily.    . iron polysaccharides (NIFEREX) 150 MG capsule Take 1 capsule (150 mg total) by mouth daily. 30 capsule 3  . lisinopril (ZESTRIL) 5 MG tablet Take 1 tablet (5 mg total) by mouth daily. 30 tablet 0  . metoprolol tartrate (LOPRESSOR) 50 MG tablet Take 1 tablet (50 mg total) by mouth 2 (two) times daily. 60 tablet 0  . sitaGLIPtin (JANUVIA) 100 MG tablet Take 100 mg by mouth daily.    . vitamin B-12 (CYANOCOBALAMIN) 100 MCG tablet Take 100 mcg by mouth daily.    . vitamin C (ASCORBIC ACID) 500 MG tablet Take 500 mg by mouth daily.    Marland Kitchen alum & mag hydroxide-simeth (MAALOX/MYLANTA) 200-200-20 MG/5ML suspension Take 30 mLs by mouth every 4 (four) hours as needed for indigestion or heartburn. (Patient not taking: Reported on 01/16/2019) 355 mL 0  . Biotin 1000 MCG tablet Take 1 tablet (1 mg total) by mouth daily. (Patient not taking: Reported on 01/16/2019) 30 tablet 0  . cetirizine (ZYRTEC) 10 MG tablet Take 10 mg by mouth daily.    . cholecalciferol (VITAMIN D3) 25 MCG (1000 UT) tablet Take 1,000 Units by mouth daily.    . Multiple Vitamins-Minerals (WOMENS MULTIVITAMIN PO) Take 1 tablet by mouth  daily.    Marland Kitchen oxyCODONE (OXY IR/ROXICODONE) 5 MG immediate release tablet Take 1 tablet (5 mg total) by mouth every 4 (four) hours as needed for moderate pain. (Patient not taking: Reported on 01/16/2019) 20 tablet 0  . pantoprazole (PROTONIX) 40 MG tablet Take 1 tablet (40 mg total) by mouth daily. (Patient not taking: Reported on 01/16/2019) 30 tablet 0   No current facility-administered medications for this visit.         Physical Exam BP 126/77 (BP Location: Right Arm, Patient Position: Sitting, Cuff Size: Normal)   Pulse 81   Resp 12   Ht 5\' 7"  (1.702 m)   Wt 138 lb (62.6 kg)   BMI 21.61 kg/m  Gen:  WD/WN, NAD Skin: incision C/D/I     Assessment/Plan:  Diabetes (HCC) blood glucose control important in reducing the progression of atherosclerotic disease. Also, involved in wound healing. On appropriate medications.   HTN (hypertension) blood pressure control important in reducing the progression of atherosclerotic disease. On appropriate oral medications.   PAD (peripheral artery disease) (HCC) Left ABI stable at 1.0.  She  will continue her Eliquis and statin agent.  Right BKA is healing well.  About half of her staples were removed today and will take the rest of them out in 1 to 2 weeks.  We will also recheck her left ABI in about 3 to 4 months.      [name] 01/16/2019, 3:40 PM   This note was created with Dragon medical transcription system.  Any errors from dictation are unintentional.

## 2019-01-16 NOTE — Assessment & Plan Note (Signed)
blood pressure control important in reducing the progression of atherosclerotic disease. On appropriate oral medications.  

## 2019-01-16 NOTE — Assessment & Plan Note (Signed)
blood glucose control important in reducing the progression of atherosclerotic disease. Also, involved in wound healing. On appropriate medications.  

## 2019-01-16 NOTE — Assessment & Plan Note (Signed)
Left ABI stable at 1.0.  She will continue her Eliquis and statin agent.  Right BKA is healing well.  About half of her staples were removed today and will take the rest of them out in 1 to 2 weeks.  We will also recheck her left ABI in about 3 to 4 months.

## 2019-01-26 ENCOUNTER — Other Ambulatory Visit: Payer: Self-pay | Admitting: Internal Medicine

## 2019-01-30 ENCOUNTER — Telehealth (INDEPENDENT_AMBULATORY_CARE_PROVIDER_SITE_OTHER): Payer: Self-pay

## 2019-01-30 ENCOUNTER — Other Ambulatory Visit: Payer: Self-pay

## 2019-01-30 ENCOUNTER — Encounter (INDEPENDENT_AMBULATORY_CARE_PROVIDER_SITE_OTHER): Payer: Self-pay

## 2019-01-30 ENCOUNTER — Ambulatory Visit (INDEPENDENT_AMBULATORY_CARE_PROVIDER_SITE_OTHER): Payer: Medicaid Other | Admitting: Nurse Practitioner

## 2019-01-30 ENCOUNTER — Other Ambulatory Visit (INDEPENDENT_AMBULATORY_CARE_PROVIDER_SITE_OTHER): Payer: Self-pay | Admitting: Nurse Practitioner

## 2019-01-30 ENCOUNTER — Encounter (INDEPENDENT_AMBULATORY_CARE_PROVIDER_SITE_OTHER): Payer: Self-pay | Admitting: Nurse Practitioner

## 2019-01-30 VITALS — BP 143/81 | HR 92 | Resp 16 | Wt 131.2 lb

## 2019-01-30 DIAGNOSIS — G546 Phantom limb syndrome with pain: Secondary | ICD-10-CM

## 2019-01-30 DIAGNOSIS — I10 Essential (primary) hypertension: Secondary | ICD-10-CM | POA: Diagnosis not present

## 2019-01-30 DIAGNOSIS — I739 Peripheral vascular disease, unspecified: Secondary | ICD-10-CM | POA: Diagnosis not present

## 2019-01-30 MED ORDER — ATORVASTATIN CALCIUM 40 MG PO TABS: 40.0000 mg | ORAL_TABLET | Freq: Every day | ORAL | 0 refills | Status: DC

## 2019-01-30 MED ORDER — LISINOPRIL 5 MG PO TABS: 5.0000 mg | ORAL_TABLET | Freq: Every day | ORAL | 0 refills | Status: DC

## 2019-01-30 MED ORDER — GABAPENTIN 300 MG PO CAPS: 300.0000 mg | ORAL_CAPSULE | Freq: Every day | ORAL | 2 refills | Status: DC

## 2019-01-30 MED ORDER — METOPROLOL TARTRATE 50 MG PO TABS: 50.0000 mg | ORAL_TABLET | Freq: Two times a day (BID) | ORAL | 0 refills | Status: DC

## 2019-01-30 MED ORDER — ASPIRIN 81 MG PO TBEC: 81.0000 mg | DELAYED_RELEASE_TABLET | Freq: Every day | ORAL | 3 refills | Status: DC

## 2019-01-30 MED ORDER — APIXABAN 2.5 MG PO TABS: 2.5000 mg | ORAL_TABLET | Freq: Two times a day (BID) | ORAL | 3 refills | Status: DC

## 2019-01-30 NOTE — Telephone Encounter (Addendum)
Patient left a message stating pharmacy hadn't received medication refill at the time she called. I called over Medication Management and spoke with someone stating they have receive medication refill through the system and will be able to filled all medications but not the Eliquis 2.5mg  due to being out of samples. The pharmacy did offer Xarelto due to taking 6-8 weeks to receive medication if provider approved but per Eulogio Ditch NP the patient should stay on Eliquis. The patient can come by the office and pick up samples of Eliquis 5mg  and take half a tablet twice a day. I left message on the patient voicemail. The patient has called back and was made with aware with medical advice.

## 2019-01-30 NOTE — Progress Notes (Signed)
SUBJECTIVE:  Patient ID: Sylvia [name], female    DOB: 10/06/1955, 63 y.o.   MRN: [id number] Chief Complaint  Patient presents with  . Suture / Staple Removal   Sylvia [name] HPI  Sylvia SensingRoberta [name] is a 63 y.o. female that presents today after right below-knee amputation.  The patient still has some staples left in however endorses having some pain in her stump.  She actually states it is more like pain in her foot.  This happens mainly during the evening time when she is trying to sleep.  Otherwise the patient states that she is doing fairly well.  She denies any fever, chills, nausea, vomiting or diarrhea.  She denies any chest pain or shortness of breath.  Her stump does not show any signs or symptoms of infection.  It is well approximated with no evidence of oozing.  Past Medical History:  Diagnosis Date  . Anxiety    h/o  . COPD (chronic obstructive pulmonary disease) (HCC)   . Diabetes mellitus without complication (HCC)   . GERD (gastroesophageal reflux disease)   . Hypertension    bp under control-off meds since 2019    Past Surgical History:  Procedure Laterality Date  . ABDOMINAL HYSTERECTOMY    . AMPUTATION Right 12/27/2018   Procedure: AMPUTATION BELOW KNEE;  Surgeon: Annice Needyew, Jason S, MD;  Location: [facility];  Service: General;  Laterality: Right;  . CHOLECYSTECTOMY    . ESOPHAGOGASTRODUODENOSCOPY (EGD) WITH PROPOFOL N/A 06/15/2018   Procedure: ESOPHAGOGASTRODUODENOSCOPY (EGD) WITH PROPOFOL;  Surgeon: Toledo, Boykin Nearingeodoro K, MD;  Location: [facility];  Service: Gastroenterology;  Laterality: N/A;  . ESOPHAGOGASTRODUODENOSCOPY (EGD) WITH PROPOFOL N/A 01/03/2019   Procedure: ESOPHAGOGASTRODUODENOSCOPY (EGD) WITH PROPOFOL;  Surgeon: Midge MiniumWohl, Darren, MD;  Location: [facility];  Service: Endoscopy;  Laterality: N/A;  . LOWER EXTREMITY ANGIOGRAPHY Right 08/21/2018   Procedure: LOWER EXTREMITY ANGIOGRAPHY;  Surgeon: Annice Needyew, Jason S, MD;  Location: [facility];  Service: Cardiovascular;   Laterality: Right;  . LOWER EXTREMITY ANGIOGRAPHY Left 08/28/2018   Procedure: LOWER EXTREMITY ANGIOGRAPHY;  Surgeon: Annice Needyew, Jason S, MD;  Location: [facility];  Service: Cardiovascular;  Laterality: Left;  . LOWER EXTREMITY ANGIOGRAPHY Right 08/28/2018   Procedure: Lower Extremity Angiography;  Surgeon: Annice Needyew, Jason S, MD;  Location: [facility];  Service: Cardiovascular;  Laterality: Right;  . LOWER EXTREMITY ANGIOGRAPHY Right 12/18/2018   Procedure: Lower Extremity Angiography;  Surgeon: Annice Needyew, Jason S, MD;  Location: [facility];  Service: Cardiovascular;  Laterality: Right;  . LOWER EXTREMITY ANGIOGRAPHY Right 12/18/2018   Procedure: Lower Extremity Angiography;  Surgeon: Annice Needyew, Jason S, MD;  Location: [facility];  Service: Cardiovascular;  Laterality: Right;  . LOWER EXTREMITY ANGIOGRAPHY Left 12/21/2018   Procedure: Lower Extremity Angiography;  Surgeon: Annice Needyew, Jason S, MD;  Location: [facility];  Service: Cardiovascular;  Laterality: Left;  . LOWER EXTREMITY ANGIOGRAPHY Right 12/21/2018   Procedure: Lower Extremity Angiography;  Surgeon: Annice Needyew, Jason S, MD;  Location: [facility];  Service: Cardiovascular;  Laterality: Right;  . LOWER EXTREMITY INTERVENTION N/A 12/22/2018   Procedure: LOWER EXTREMITY INTERVENTION;  Surgeon: Annice Needyew, Jason S, MD;  Location: [facility];  Service: Cardiovascular;  Laterality: N/A;    Social History   Socioeconomic History  . Marital status: Divorced    Spouse name: Not on file  . Number of children: Not on file  . Years of education: Not on file  . Highest education level: Not on file  Occupational History  . Not on file  Social Needs  . Financial resource strain: Not hard at all  . Food insecurity    Worry: Sometimes true    Inability: Sometimes true  . Transportation needs    Medical: No    Non-medical: No  Tobacco Use  . Smoking status: Former Smoker    Packs/day: 0.25    Years: 45.00    Pack  years: 11.25    Types: Cigarettes    Quit date: 08/28/2018    Years since quitting: 0.4  . Smokeless tobacco: Never Used  . Tobacco comment: quit  Substance and Sexual Activity  . Alcohol use: No    Frequency: Never  . Drug use: Never  . Sexual activity: Not Currently  Lifestyle  . Physical activity    Days per week: 4 days    Minutes per session: 20 min  . Stress: Only a little  Relationships  . Social Musician on phone: Patient refused    Gets together: Patient refused    Attends religious service: Patient refused    Active member of club or organization: Patient refused    Attends meetings of clubs or organizations: Patient refused    Relationship status: Patient refused  . Intimate partner violence    Fear of current or ex partner: Patient refused    Emotionally abused: Patient refused    Physically abused: Patient refused    Forced sexual activity: Patient refused  Other Topics Concern  . Not on file  Social History Narrative  . Not on file    Family History  Problem Relation Age of Onset  . Breast cancer Mother 46    Allergies  Allergen Reactions  . Penicillins Hives    Has patient had a PCN reaction causing immediate rash, facial/tongue/throat swelling, SOB or lightheadedness with hypotension: Yes Has patient had a PCN reaction causing severe rash involving mucus membranes or skin necrosis: No Has patient had a PCN reaction that required hospitalization: No Has patient had a PCN reaction occurring within the last 10 years: No If all of the above answers are "NO", then may proceed with Cephalosporin use.  . Tramadol Itching     Review of Systems   Review of Systems: Negative Unless Checked Constitutional: [] Weight loss  [] Fever  [] Chills Cardiac: [] Chest pain   []  Atrial Fibrillation  [] Palpitations   [] Shortness of breath when laying flat   [] Shortness of breath with exertion. [] Shortness of breath at rest Vascular:  [] Pain in legs with  walking   [] Pain in legs with standing [] Pain in legs when laying flat   [] Claudication    [] Pain in feet when laying flat    [] History of DVT   [] Phlebitis   [] Swelling in legs   [] Varicose veins   [] Non-healing ulcers Pulmonary:   [] Uses home oxygen   [] Productive cough   [] Hemoptysis   [] Wheeze  [] COPD   [] Asthma Neurologic:  [] Dizziness   [] Seizures  [] Blackouts [] History of stroke   [] History of TIA  [] Aphasia   [] Temporary Blindness   [] Weakness or numbness in arm   [x] Weakness or numbness in leg Musculoskeletal:   [] Joint swelling   [] Joint pain   [] Low back pain  []  History of Knee Replacement [] Arthritis [] back Surgeries  []  Spinal Stenosis    Hematologic:  [] Easy bruising  [] Easy bleeding   [] Hypercoagulable state   [] Anemic Gastrointestinal:  [] Diarrhea   [] Vomiting  [] Gastroesophageal reflux/heartburn   [] Difficulty swallowing. [] Abdominal pain  Genitourinary:  [] Chronic kidney disease   [] Difficult urination  [] Anuric   [] Blood in urine [] Frequent urination  [] Burning with urination   [] Hematuria Skin:  [] Rashes   [] Ulcers [] Wounds Psychological:  [] History of anxiety   []  History of major depression  []  Memory Difficulties      OBJECTIVE:   Physical Exam  BP (!) 143/81 (BP Location: Right Arm)   Pulse 92   Resp 16   Wt 131 lb 3.2 oz (59.5 kg)   BMI 20.55 kg/m   Gen: WD/WN, NAD Head: [place]/AT, No temporalis wasting.  Ear/Nose/Throat: Hearing grossly intact, nares w/o erythema or drainage Eyes: PER, EOMI, sclera nonicteric.  Neck: Supple, no masses.  No JVD.  Pulmonary:  Good air movement, no use of accessory muscles.  Cardiac: RRR Vascular:  Right below-knee amputation.  Well approximated.  No signs symptoms of infection.  No bruising. Vessel Right Left  Radial Palpable Palpable   Gastrointestinal: soft, non-distended. No guarding/no peritoneal signs.  Musculoskeletal: M/S 5/5 throughout.  No deformity or atrophy.  Neurologic: Pain and light touch intact in extremities.   Symmetrical.  Speech is fluent. Motor exam as listed above. Psychiatric: Judgment intact, Mood & affect appropriate for pt's clinical situation. Dermatologic: No Venous rashes. No Ulcers Noted.  No changes consistent with cellulitis. Lymph : No Cervical lymphadenopathy, no lichenification or skin changes of chronic lymphedema.       ASSESSMENT AND PLAN:  1. PAD (peripheral artery disease) (HCC) Currently the patient's stump looks well.  Is well approximated with no evidence of oozing.  She tolerated the staple removal well.  We will facilitate referral to have the patient shrinker sock as well as gait training for upcoming prosthesis.  We will have the patient return in 6 weeks to ensure that the wound is fully healed.  Patient is instructed to call us sooner if she notices an opening of the wound or signs or symptoms of infection.  Steri-Strips were also placed on the wound.  Patient will follow up in 6 weeks with no studies. - atorvastatin (LIPITOR) 40 MG tablet; Take 1 tablet (40 mg total) by mouth daily.  Dispense: 30 tablet; Refill: 0 - aspirin 81 MG EC tablet; Take 1 tablet (81 mg total) by mouth daily.  Dispense: 30 tablet; Refill: 3 - apixaban (ELIQUIS) 2.5 MG TABS tablet; Take 1 tablet (2.5 mg total) by mouth 2 (two) times daily.  Dispense: 60 tablet; Refill: 3  2. Essential hypertension Patient was discharged with Lopressor as well as lisinopril.  The patient states that she currently has a primary care physician however she would not be seeing them for a little while.  I went ahead and issued a one-time refill of these medications.  I discussed with her chronic disease management and why it is very important that a primary care physician following prescribe these medications.  The patient understood as well. - metoprolol tartrate (LOPRESSOR) 50 MG tablet; Take 1 tablet (50 mg total) by mouth 2 (two) times daily.  Dispense: 60 tablet; Refill: 0 - lisinopril (ZESTRIL) 5 MG tablet; Take 1  tablet (5 mg total) by mouth daily.  Dispense: 30 tablet; Refill: 0  3. Phantom pain after amputation of lower extremity (HCC) We will attempt to try the patient with some gabapentin to see if this helps with her phantom limb pain.  The patient is not keen on pain medication.  I also discussed the fact that it may take several weeks for gabapentin to take effect and  that titration may be necessary.  We will speak with patient about progress when she returns in 6 weeks. - gabapentin (NEURONTIN) 300 MG capsule; Take 1 capsule (300 mg total) by mouth at bedtime.  Dispense: 30 capsule; Refill: 2   Current Outpatient Medications on File Prior to Visit  Medication Sig Dispense Refill  . acetaminophen (TYLENOL) 325 MG tablet Take 650 mg by mouth every 6 (six) hours as needed.    Marland Kitchen. albuterol (PROVENTIL HFA;VENTOLIN HFA) 108 (90 Base) MCG/ACT inhaler Inhale 2 puffs into the lungs every 6 (six) hours as needed for wheezing or shortness of breath. 1 Inhaler 2  . cetirizine (ZYRTEC) 10 MG tablet Take 10 mg by mouth daily.    . cholecalciferol (VITAMIN D3) 25 MCG (1000 UT) tablet Take 1,000 Units by mouth daily.    Marland Kitchen. docusate sodium (COLACE) 100 MG capsule Take 100 mg by mouth 2 (two) times daily.    . iron polysaccharides (NIFEREX) 150 MG capsule Take 1 capsule (150 mg total) by mouth daily. 30 capsule 3  . Multiple Vitamins-Minerals (WOMENS MULTIVITAMIN PO) Take 1 tablet by mouth daily.    . sitaGLIPtin (JANUVIA) 100 MG tablet Take 100 mg by mouth daily.    . vitamin B-12 (CYANOCOBALAMIN) 100 MCG tablet Take 100 mcg by mouth daily.    . vitamin C (ASCORBIC ACID) 500 MG tablet Take 500 mg by mouth daily.    Marland Kitchen. alum & mag hydroxide-simeth (MAALOX/MYLANTA) 200-200-20 MG/5ML suspension Take 30 mLs by mouth every 4 (four) hours as needed for indigestion or heartburn. (Patient not taking: Reported on 01/16/2019) 355 mL 0  . Biotin 1000 MCG tablet Take 1 tablet (1 mg total) by mouth daily. (Patient not taking:  Reported on 01/16/2019) 30 tablet 0  . oxyCODONE (OXY IR/ROXICODONE) 5 MG immediate release tablet Take 1 tablet (5 mg total) by mouth every 4 (four) hours as needed for moderate pain. (Patient not taking: Reported on 01/16/2019) 20 tablet 0  . pantoprazole (PROTONIX) 40 MG tablet Take 1 tablet (40 mg total) by mouth daily. (Patient not taking: Reported on 01/16/2019) 30 tablet 0   No current facility-administered medications on file prior to visit.     There are no Patient Instructions on file for this visit. No follow-ups on file.   Georgiana SpinnerFallon E Labarron Durnin, NP  This note was completed with Office managerDragon Dictation.  Any errors are purely unintentional.

## 2019-02-07 ENCOUNTER — Other Ambulatory Visit (INDEPENDENT_AMBULATORY_CARE_PROVIDER_SITE_OTHER): Payer: Self-pay | Admitting: Nurse Practitioner

## 2019-02-07 ENCOUNTER — Telehealth (INDEPENDENT_AMBULATORY_CARE_PROVIDER_SITE_OTHER): Payer: Self-pay | Admitting: Nurse Practitioner

## 2019-02-07 MED ORDER — POLYSACCHARIDE IRON COMPLEX 150 MG PO CAPS: 150.0000 mg | ORAL_CAPSULE | Freq: Every day | ORAL | 0 refills | Status: DC

## 2019-02-07 NOTE — Telephone Encounter (Signed)
Patient is aware of the below and verbalized understanding. AS, CMA 

## 2019-02-07 NOTE — Telephone Encounter (Signed)
Patient called and requested refill on Niferex 150mg  to be e-scribed to [facility]ication Management clinic. AS, CMA

## 2019-02-07 NOTE — Telephone Encounter (Signed)
A one time refill has been sent in. Any further refill requests will need to go through her PCP as we typically do not follow patients for long term medication management as it relates to niferex

## 2019-02-19 ENCOUNTER — Other Ambulatory Visit: Payer: Self-pay

## 2019-02-19 ENCOUNTER — Ambulatory Visit: Payer: Medicaid Other | Admitting: Pharmacy Technician

## 2019-02-19 DIAGNOSIS — Z79899 Other long term (current) drug therapy: Secondary | ICD-10-CM

## 2019-02-20 NOTE — Progress Notes (Signed)
Provided patient with financial assistance application for  due to recent hospital visit.  Patient agreed to be responsible for gathering financial information and forwarding to appropriate department in [facility].    Completed [facility] application and contract.  Patient agreed to all terms of the [facility] contract.   Patient will be eligible to sign-up for Medicare in September 2021.  Explained that patient should sign-up for Medicare A, B & D to avoid penalty.  Also, made patient aware that no medication assistance will be provided by [facility] once Medicare becomes available.  Patient verbally acknowledged that they understood.    Provided patient with community resource material based on her particular needs.    Referred patient to [facility].  Patient stated that she is seeing Dr. Sanda Klein at [facility].  Wants to remain with Dr. Sanda Klein since once receiving Medicare, patient could no longer receive services at [facility].  Referred patient for MTM.  Patient to contact providers to have prescriptions sent to our clinic.  [facility]

## 2019-02-22 ENCOUNTER — Other Ambulatory Visit (INDEPENDENT_AMBULATORY_CARE_PROVIDER_SITE_OTHER): Payer: Self-pay | Admitting: Nurse Practitioner

## 2019-02-22 ENCOUNTER — Telehealth (INDEPENDENT_AMBULATORY_CARE_PROVIDER_SITE_OTHER): Payer: Self-pay

## 2019-02-22 MED ORDER — HYDROCODONE-ACETAMINOPHEN 5-325 MG PO TABS: 1.0000 | ORAL_TABLET | Freq: Four times a day (QID) | ORAL | 0 refills | Status: DC | PRN

## 2019-02-22 NOTE — Telephone Encounter (Signed)
Patient has been aware with medical advice and verbalized understanding 

## 2019-02-22 NOTE — Telephone Encounter (Signed)
We will send in some hydrocodone. As long as the wound isn't open we can keep the scheduled appt in a few weeks.

## 2019-03-07 ENCOUNTER — Other Ambulatory Visit: Payer: Self-pay

## 2019-03-07 ENCOUNTER — Ambulatory Visit: Payer: Medicaid Other | Admitting: Pharmacist

## 2019-03-07 DIAGNOSIS — Z79899 Other long term (current) drug therapy: Secondary | ICD-10-CM

## 2019-03-07 NOTE — Progress Notes (Signed)
Medication Management Clinic Visit Note  Patient: Sylvia [name] MRN: [id number] Date of Birth: 03-09-1956 PCP: Patient, No Pcp Per   [name] 63 y.o. female presents for an initial MTM visit today.  Patient's identity was confirmed with name and birthdate.  There were no vitals taken for this visit.  Patient Information   Past Medical History:  Diagnosis Date  . Anxiety    h/o  . COPD (chronic obstructive pulmonary disease) (HCC)   . Diabetes mellitus without complication (HCC)   . GERD (gastroesophageal reflux disease)   . Hypertension    bp under control-off meds since 2019      Past Surgical History:  Procedure Laterality Date  . ABDOMINAL HYSTERECTOMY    . AMPUTATION Right 12/27/2018   Procedure: AMPUTATION BELOW KNEE;  Surgeon: Annice Needy, MD;  Location: [facility];  Service: General;  Laterality: Right;  . CHOLECYSTECTOMY    . ESOPHAGOGASTRODUODENOSCOPY (EGD) WITH PROPOFOL N/A 06/15/2018   Procedure: ESOPHAGOGASTRODUODENOSCOPY (EGD) WITH PROPOFOL;  Surgeon: Toledo, Boykin Nearing, MD;  Location: [facility];  Service: Gastroenterology;  Laterality: N/A;  . ESOPHAGOGASTRODUODENOSCOPY (EGD) WITH PROPOFOL N/A 01/03/2019   Procedure: ESOPHAGOGASTRODUODENOSCOPY (EGD) WITH PROPOFOL;  Surgeon: Midge Minium, MD;  Location: [facility];  Service: Endoscopy;  Laterality: N/A;  . LOWER EXTREMITY ANGIOGRAPHY Right 08/21/2018   Procedure: LOWER EXTREMITY ANGIOGRAPHY;  Surgeon: Annice Needy, MD;  Location: [facility];  Service: Cardiovascular;  Laterality: Right;  . LOWER EXTREMITY ANGIOGRAPHY Left 08/28/2018   Procedure: LOWER EXTREMITY ANGIOGRAPHY;  Surgeon: Annice Needy, MD;  Location: [facility];  Service: Cardiovascular;  Laterality: Left;  . LOWER EXTREMITY ANGIOGRAPHY Right 08/28/2018   Procedure: Lower Extremity Angiography;  Surgeon: Annice Needy, MD;  Location: [facility];  Service: Cardiovascular;  Laterality: Right;  . LOWER EXTREMITY  ANGIOGRAPHY Right 12/18/2018   Procedure: Lower Extremity Angiography;  Surgeon: Annice Needy, MD;  Location: [facility];  Service: Cardiovascular;  Laterality: Right;  . LOWER EXTREMITY ANGIOGRAPHY Right 12/18/2018   Procedure: Lower Extremity Angiography;  Surgeon: Annice Needy, MD;  Location: [facility];  Service: Cardiovascular;  Laterality: Right;  . LOWER EXTREMITY ANGIOGRAPHY Left 12/21/2018   Procedure: Lower Extremity Angiography;  Surgeon: Annice Needy, MD;  Location: [facility];  Service: Cardiovascular;  Laterality: Left;  . LOWER EXTREMITY ANGIOGRAPHY Right 12/21/2018   Procedure: Lower Extremity Angiography;  Surgeon: Annice Needy, MD;  Location: [facility];  Service: Cardiovascular;  Laterality: Right;  . LOWER EXTREMITY INTERVENTION N/A 12/22/2018   Procedure: LOWER EXTREMITY INTERVENTION;  Surgeon: Annice Needy, MD;  Location: [facility];  Service: Cardiovascular;  Laterality: N/A;     Family History  Problem Relation Age of Onset  . Breast cancer Mother 98           Social History   Substance and Sexual Activity  Alcohol Use No  . Frequency: Never      Social History   Tobacco Use  Smoking Status Former Smoker  . Packs/day: 0.25  . Years: 45.00  . Pack years: 11.25  . Types: Cigarettes  . Quit date: 08/28/2018  . Years since quitting: 0.5  Smokeless Tobacco Never Used  Tobacco Comment   quit      Health Maintenance  Topic Date Due  . Hepatitis C Screening  December 05, 1955  . FOOT EXAM  12/21/1964  . OPHTHALMOLOGY EXAM  12/21/1964  . TETANUS/TDAP  12/21/1973  .  PAP SMEAR-Modifier  12/22/1975  . COLONOSCOPY  12/21/2004  . INFLUENZA VACCINE  11/18/2018  . MAMMOGRAM  04/06/2019  . HEMOGLOBIN A1C  06/17/2019  . HIV Screening  Completed     Assessment and Plan:  Peripheral artery disease:   On aspirin, Eliquis.  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  In the same admission, she also developed a GI bleed, requiring 4 units PRBC.  CBC from recent medical visits in September in 9/20 show elevated platelets (600-700s) and hemoglobin averaging 7-9.  She was restarted on apixaban and aspirin.  She is currently using free samples of Eliquis until she submits required forms to [facility] for PAP.  She has been falling quite often due to phantom limb pain.  During this visit, patient was therefore advised to monitor for dark, tarry stools, as well as unusual bruising, and hematuria.  She was also encouraged to go to ED if she fall and hits her head especially, as she may have to get a CT of the head if this occurs.      Phantom limb pain/Recent fall: Gabapentin.  Patient reports 10/10 pain.  Patient also admits to taking hydrocodone-acetaminophen once or twice daily PRN pain.     Mood swings: Duloxetine.  Patient recently started this medication (about 2 weeks ago), and reports feeling drowsy.  She tried taking it at night, but the stimulating effects keep her awake at night.  She is also having reduced appetite as well, which is common with this medication.   She used to eat 3 meals daily, but now is eating 1 meal/day.  She was informed that these symptoms may subside over the next 2-4 weeks, as she recently started duloxetine.  However, if they do not get any better, she was advised to reach out to her provider.   COPD:  Patient reports taking Albuterol PRN   Diabetes: Januvia.  A1c 7.9% (8/20).  Patient reports last A1c was 6.8% most recently when she visited her PCP.    Patient recently admitted in August for DKA.  Checks blood sugar twice daily, and keeps a log of her readings.  This morning, FBG was 163.  She reports this is good, sometimes BG is as high as 200s in the AM.  PPBG was 188 yesterday, and reports usually this is normal.  On Nov 8, however, this was 287, but she admitted to having candy before this reading.  As far as diet, patient  reports not having a good appetite, and drinks more fluids than eating, probably a result fo duloxetine.  Her diet is good, she avoids fried meets (she prefers baking), and understands non-starchy vegetable sources of food, and was advised to avoid canned soups/vegetables and frozen meals as these contain high sodium.             GERD:   Pantoprazole s/p GI bleed.      HTN: Lisinopril, metoprolol.  GFR (AA) >60 9/20.  Scr seemed stable ~1 at this time, but a little above baseline.  Recent doctor visit ~2 weeks prior revealed good blood pressure (SBP 134).  Patient has blood pressure machine, but malfunctioning.  Advised her to take to her next doctor's appointment or bring to [place] to examine.           Pharmacy transfer: Patient is new to our clinic.  Patient needs refills for atorvastatin, iron, januvia, and cetirizine.  I told her we can transfer  her medications from [facility].   Follow up:  Follow up in 3 months, as patient has history of uncontrolled diabetes, along with new duloxetine start, and Eliquis.    Gerald Dexter, PharmD Pharmacy Resident  03/07/2019 11:29 AM

## 2019-03-13 ENCOUNTER — Ambulatory Visit (INDEPENDENT_AMBULATORY_CARE_PROVIDER_SITE_OTHER): Payer: Medicaid Other | Admitting: Vascular Surgery

## 2019-03-13 ENCOUNTER — Encounter (INDEPENDENT_AMBULATORY_CARE_PROVIDER_SITE_OTHER): Payer: Self-pay | Admitting: Nurse Practitioner

## 2019-03-13 ENCOUNTER — Ambulatory Visit (INDEPENDENT_AMBULATORY_CARE_PROVIDER_SITE_OTHER): Payer: Medicaid Other | Admitting: Nurse Practitioner

## 2019-03-13 ENCOUNTER — Other Ambulatory Visit: Payer: Self-pay

## 2019-03-13 VITALS — BP 171/77 | HR 88 | Resp 16 | Ht 67.0 in | Wt 132.0 lb

## 2019-03-13 DIAGNOSIS — E1152 Type 2 diabetes mellitus with diabetic peripheral angiopathy with gangrene: Secondary | ICD-10-CM | POA: Diagnosis not present

## 2019-03-13 DIAGNOSIS — I739 Peripheral vascular disease, unspecified: Secondary | ICD-10-CM | POA: Diagnosis not present

## 2019-03-13 DIAGNOSIS — I10 Essential (primary) hypertension: Secondary | ICD-10-CM

## 2019-03-20 ENCOUNTER — Encounter (INDEPENDENT_AMBULATORY_CARE_PROVIDER_SITE_OTHER): Payer: Self-pay | Admitting: Nurse Practitioner

## 2019-03-20 NOTE — Progress Notes (Signed)
SUBJECTIVE:  Patient ID: Sylvia [name], female    DOB: 1955/09/12, 63 y.o.   MRN: [id number] Chief Complaint  Patient presents with  . Follow-up    6 wk, no studies    HPI  Sylvia [name] is a 63 y.o. female the presents today for evaluation for her right below-knee amputation.  Today the wound looks completely healed.  The patient is also doing better with phantom limb pain.  She denies any fever, chills, nausea, vomiting or diarrhea.  The patient is eager to begin utilizing a prosthetic.  Past Medical History:  Diagnosis Date  . Anxiety    h/o  . COPD (chronic obstructive pulmonary disease) (HCC)   . Diabetes mellitus without complication (HCC)   . GERD (gastroesophageal reflux disease)   . Hypertension    bp under control-off meds since 2019    Past Surgical History:  Procedure Laterality Date  . ABDOMINAL HYSTERECTOMY    . AMPUTATION Right 12/27/2018   Procedure: AMPUTATION BELOW KNEE;  Surgeon: Annice Needy, MD;  Location: [facility];  Service: General;  Laterality: Right;  . CHOLECYSTECTOMY    . ESOPHAGOGASTRODUODENOSCOPY (EGD) WITH PROPOFOL N/A 06/15/2018   Procedure: ESOPHAGOGASTRODUODENOSCOPY (EGD) WITH PROPOFOL;  Surgeon: Toledo, Boykin Nearing, MD;  Location: [facility];  Service: Gastroenterology;  Laterality: N/A;  . ESOPHAGOGASTRODUODENOSCOPY (EGD) WITH PROPOFOL N/A 01/03/2019   Procedure: ESOPHAGOGASTRODUODENOSCOPY (EGD) WITH PROPOFOL;  Surgeon: Midge Minium, MD;  Location: [facility];  Service: Endoscopy;  Laterality: N/A;  . LOWER EXTREMITY ANGIOGRAPHY Right 08/21/2018   Procedure: LOWER EXTREMITY ANGIOGRAPHY;  Surgeon: Annice Needy, MD;  Location: [facility];  Service: Cardiovascular;  Laterality: Right;  . LOWER EXTREMITY ANGIOGRAPHY Left 08/28/2018   Procedure: LOWER EXTREMITY ANGIOGRAPHY;  Surgeon: Annice Needy, MD;  Location: [facility];  Service: Cardiovascular;  Laterality: Left;  . LOWER EXTREMITY ANGIOGRAPHY Right 08/28/2018   Procedure: Lower Extremity Angiography;  Surgeon: Annice Needy, MD;  Location: [facility];  Service: Cardiovascular;  Laterality: Right;  . LOWER EXTREMITY ANGIOGRAPHY Right 12/18/2018   Procedure: Lower Extremity Angiography;  Surgeon: Annice Needy, MD;  Location: [facility];  Service: Cardiovascular;  Laterality: Right;  . LOWER EXTREMITY ANGIOGRAPHY Right 12/18/2018   Procedure: Lower Extremity Angiography;  Surgeon: Annice Needy, MD;  Location: [facility];  Service: Cardiovascular;  Laterality: Right;  . LOWER EXTREMITY ANGIOGRAPHY Left 12/21/2018   Procedure: Lower Extremity Angiography;  Surgeon: Annice Needy, MD;  Location: [facility];  Service: Cardiovascular;  Laterality: Left;  . LOWER EXTREMITY ANGIOGRAPHY Right 12/21/2018   Procedure: Lower Extremity Angiography;  Surgeon: Annice Needy, MD;  Location: [facility];  Service: Cardiovascular;  Laterality: Right;  . LOWER EXTREMITY INTERVENTION N/A 12/22/2018   Procedure: LOWER EXTREMITY INTERVENTION;  Surgeon: Annice Needy, MD;  Location: [facility];  Service: Cardiovascular;  Laterality: N/A;    Social History   Socioeconomic History  . Marital status: Divorced    Spouse name: Not on file  . Number of children: Not on file  . Years of education: Not on file  . Highest education level: Not on file  Occupational History  . Not on file  Social Needs  . Financial resource strain: Not hard at all  . Food insecurity    Worry: Sometimes true    Inability: Sometimes true  . Transportation needs    Medical: No    Non-medical: No  Tobacco  Use  . Smoking status: Former Smoker    Packs/day: 0.25    Years: 45.00    Pack years: 11.25    Types: Cigarettes    Quit date: 08/28/2018    Years since quitting: 0.5  . Smokeless tobacco: Never Used  . Tobacco comment: quit  Substance and Sexual Activity  . Alcohol use: No    Frequency: Never  . Drug use: Never  . Sexual activity: Not  Currently  Lifestyle  . Physical activity    Days per week: 4 days    Minutes per session: 20 min  . Stress: Only a little  Relationships  . Social Herbalist on phone: Patient refused    Gets together: Patient refused    Attends religious service: Patient refused    Active member of club or organization: Patient refused    Attends meetings of clubs or organizations: Patient refused    Relationship status: Patient refused  . Intimate partner violence    Fear of current or ex partner: Patient refused    Emotionally abused: Patient refused    Physically abused: Patient refused    Forced sexual activity: Patient refused  Other Topics Concern  . Not on file  Social History Narrative  . Not on file    Family History  Problem Relation Age of Onset  . Breast cancer Mother 96    Allergies  Allergen Reactions  . Penicillins Hives    Has patient had a PCN reaction causing immediate rash, facial/tongue/throat swelling, SOB or lightheadedness with hypotension: Yes Has patient had a PCN reaction causing severe rash involving mucus membranes or skin necrosis: No Has patient had a PCN reaction that required hospitalization: No Has patient had a PCN reaction occurring within the last 10 years: No If all of the above answers are "NO", then may proceed with Cephalosporin use.  . Tramadol Itching     Review of Systems   Review of Systems: Negative Unless Checked Constitutional: [] Weight loss  [] Fever  [] Chills Cardiac: [] Chest pain   []  Atrial Fibrillation  [] Palpitations   [] Shortness of breath when laying flat   [] Shortness of breath with exertion. [] Shortness of breath at rest Vascular:  [] Pain in legs with walking   [] Pain in legs with standing [] Pain in legs when laying flat   [] Claudication    [] Pain in feet when laying flat    [] History of DVT   [] Phlebitis   [] Swelling in legs   [] Varicose veins   [] Non-healing ulcers Pulmonary:   [] Uses home oxygen   [] Productive cough    [] Hemoptysis   [] Wheeze  [x] COPD   [] Asthma Neurologic:  [] Dizziness   [] Seizures  [] Blackouts [] History of stroke   [] History of TIA  [] Aphasia   [] Temporary Blindness   [] Weakness or numbness in arm   [x] Weakness or numbness in leg Musculoskeletal:   [] Joint swelling   [] Joint pain   [] Low back pain  []  History of Knee Replacement [] Arthritis [] back Surgeries  []  Spinal Stenosis    Hematologic:  [] Easy bruising  [] Easy bleeding   [] Hypercoagulable state   [] Anemic Gastrointestinal:  [] Diarrhea   [] Vomiting  [x] Gastroesophageal reflux/heartburn   [] Difficulty swallowing. [] Abdominal pain Genitourinary:  [] Chronic kidney disease   [] Difficult urination  [] Anuric   [] Blood in urine [] Frequent urination  [] Burning with urination   [] Hematuria Skin:  [] Rashes   [] Ulcers [] Wounds Psychological:  [x] History of anxiety   []  History of major depression  []  Memory Difficulties  OBJECTIVE:   Physical Exam  BP (!) 171/77 (BP Location: Right Arm)   Pulse 88   Resp 16   Ht 5\' 7"  (1.702 m)   Wt 132 lb (59.9 kg)   BMI 20.67 kg/m   Gen: WD/WN, NAD Head: [place]/AT, No temporalis wasting.  Ear/Nose/Throat: Hearing grossly intact, nares w/o erythema or drainage Eyes: PER, EOMI, sclera nonicteric.  Neck: Supple, no masses.  No JVD.  Pulmonary:  Good air movement, no use of accessory muscles.  Cardiac: RRR Vascular:  Blood below-knee amputation, mostly healed Vessel Right Left  Dorsalis Pedis  Palpable  Posterior Tibial  Palpable   Gastrointestinal: soft, non-distended. No guarding/no peritoneal signs.  Musculoskeletal: M/S 5/5 throughout.  No deformity or atrophy.  Neurologic: Pain and light touch intact in extremities.  Symmetrical.  Speech is fluent. Motor exam as listed above. Psychiatric: Judgment intact, Mood & affect appropriate for pt's clinical situation. Dermatologic: No Venous rashes. No Ulcers Noted.  No changes consistent with cellulitis. Lymph : No Cervical lymphadenopathy, no  lichenification or skin changes of chronic lymphedema.       ASSESSMENT AND PLAN:  1. PAD (peripheral artery disease) (HCC) Overall, the patient is doing well post amputation.  We will have her follow-up in 3 months with noninvasive studies to check on her left lower extremity.  We will also start the referral process for her to get a right below-knee prosthesis.  2. Type 2 diabetes mellitus with diabetic peripheral angiopathy with gangrene, unspecified whether long term insulin use (HCC) Continue hypoglycemic medications as already ordered, these medications have been reviewed and there are no changes at this time.  Hgb A1C to be monitored as already arranged by primary service   3. Essential hypertension Continue antihypertensive medications as already ordered, these medications have been reviewed and there are no changes at this time.    Current Outpatient Medications on File Prior to Visit  Medication Sig Dispense Refill  . acetaminophen (TYLENOL) 325 MG tablet Take 650 mg by mouth every 6 (six) hours as needed.    Marland Kitchen. albuterol (PROVENTIL HFA;VENTOLIN HFA) 108 (90 Base) MCG/ACT inhaler Inhale 2 puffs into the lungs every 6 (six) hours as needed for wheezing or shortness of breath. 1 Inhaler 2  . apixaban (ELIQUIS) 2.5 MG TABS tablet Take 1 tablet (2.5 mg total) by mouth 2 (two) times daily. 60 tablet 3  . aspirin 81 MG EC tablet Take 1 tablet (81 mg total) by mouth daily. 30 tablet 3  . atorvastatin (LIPITOR) 40 MG tablet Take 1 tablet (40 mg total) by mouth daily. 30 tablet 0  . Biotin 1000 MCG tablet Take 1 tablet (1 mg total) by mouth daily. 30 tablet 0  . cetirizine (ZYRTEC) 10 MG tablet Take 10 mg by mouth daily.    . cholecalciferol (VITAMIN D3) 25 MCG (1000 UT) tablet Take 1,000 Units by mouth daily.    Marland Kitchen. docusate sodium (COLACE) 100 MG capsule Take 100 mg by mouth 2 (two) times daily.    . DULoxetine (CYMBALTA) 30 MG capsule Take 30 mg by mouth daily.    Marland Kitchen. gabapentin  (NEURONTIN) 300 MG capsule Take 1 capsule (300 mg total) by mouth at bedtime. 30 capsule 2  . HYDROcodone-acetaminophen (NORCO/VICODIN) 5-325 MG tablet Take 1 tablet by mouth every 6 (six) hours as needed for moderate pain. 30 tablet 0  . iron polysaccharides (NIFEREX) 150 MG capsule Take 1 capsule (150 mg total) by mouth daily. 30 capsule 0  . lisinopril (ZESTRIL)  5 MG tablet Take 1 tablet (5 mg total) by mouth daily. 30 tablet 0  . metoprolol tartrate (LOPRESSOR) 50 MG tablet Take 1 tablet (50 mg total) by mouth 2 (two) times daily. 60 tablet 0  . oxyCODONE (OXY IR/ROXICODONE) 5 MG immediate release tablet Take 1 tablet (5 mg total) by mouth every 4 (four) hours as needed for moderate pain. 20 tablet 0  . pantoprazole (PROTONIX) 40 MG tablet Take 1 tablet (40 mg total) by mouth daily. (Patient taking differently: Take 40 mg by mouth daily. Dennie Bibleat reports taking twice daily.) 30 tablet 0  . sitaGLIPtin (JANUVIA) 100 MG tablet Take 100 mg by mouth daily.    . vitamin B-12 (CYANOCOBALAMIN) 100 MCG tablet Take 100 mcg by mouth daily.    . vitamin C (ASCORBIC ACID) 500 MG tablet Take 500 mg by mouth daily.    Marland Kitchen. alum & mag hydroxide-simeth (MAALOX/MYLANTA) 200-200-20 MG/5ML suspension Take 30 mLs by mouth every 4 (four) hours as needed for indigestion or heartburn. (Patient not taking: Reported on 01/16/2019) 355 mL 0  . Multiple Vitamins-Minerals (WOMENS MULTIVITAMIN PO) Take 1 tablet by mouth daily.     No current facility-administered medications on file prior to visit.     There are no Patient Instructions on file for this visit. No follow-ups on file.   Georgiana SpinnerFallon E Izzah Pasqua, NP  This note was completed with Office managerDragon Dictation.  Any errors are purely unintentional.

## 2019-04-02 ENCOUNTER — Telehealth: Payer: Self-pay | Admitting: Pharmacist

## 2019-04-02 NOTE — Telephone Encounter (Signed)
04/02/2019 9:02:40 PM - BMS application faxed for Eliquis  04/02/2019 Faxed [place] application for enrollment on Eliquis 2.5mg  Take one tablet by mouth 2 times a day.Delos Haring

## 2019-04-03 DIAGNOSIS — E119 Type 2 diabetes mellitus without complications: Secondary | ICD-10-CM | POA: Diagnosis not present

## 2019-04-09 ENCOUNTER — Telehealth: Payer: Self-pay | Admitting: Pharmacist

## 2019-04-09 NOTE — Telephone Encounter (Signed)
04/09/2019 1:43:24 PM - Eliquis updated app. to pt & dr  04/09/2019 I have received a letter & updated BMS forms to be completed, I am mailing patient her portion & provider her portion to sign & return for patient to receive Eliquis.Delos Haring

## 2019-04-11 ENCOUNTER — Telehealth (INDEPENDENT_AMBULATORY_CARE_PROVIDER_SITE_OTHER): Payer: Self-pay

## 2019-04-11 NOTE — Telephone Encounter (Signed)
Patient has been made aware with medical advice and verbalized understanding 

## 2019-04-11 NOTE — Telephone Encounter (Signed)
The patient should report to the ED for evaluation.  She has a history of GI bleed and with black stools and dizziness it could be an indication she is actively bleeding again.  As far as the referral for the shrinker sock, it was sent on 02/02/2019 but we will inquire with [facility] again as to the status of the prosthesis.

## 2019-04-12 ENCOUNTER — Inpatient Hospital Stay
Admission: EM | Admit: 2019-04-12 | Discharge: 2019-04-17 | DRG: 378 | Disposition: A | Discharge status: Home Health | Payer: Medicaid Other | Attending: Internal Medicine | Admitting: Internal Medicine

## 2019-04-12 ENCOUNTER — Encounter: Payer: Self-pay | Admitting: Emergency Medicine

## 2019-04-12 ENCOUNTER — Other Ambulatory Visit: Payer: Self-pay

## 2019-04-12 DIAGNOSIS — Z9049 Acquired absence of other specified parts of digestive tract: Secondary | ICD-10-CM

## 2019-04-12 DIAGNOSIS — F419 Anxiety disorder, unspecified: Secondary | ICD-10-CM | POA: Diagnosis present

## 2019-04-12 DIAGNOSIS — I10 Essential (primary) hypertension: Secondary | ICD-10-CM | POA: Diagnosis present

## 2019-04-12 DIAGNOSIS — K922 Gastrointestinal hemorrhage, unspecified: Secondary | ICD-10-CM

## 2019-04-12 DIAGNOSIS — I739 Peripheral vascular disease, unspecified: Secondary | ICD-10-CM | POA: Diagnosis present

## 2019-04-12 DIAGNOSIS — J449 Chronic obstructive pulmonary disease, unspecified: Secondary | ICD-10-CM | POA: Diagnosis present

## 2019-04-12 DIAGNOSIS — Z87891 Personal history of nicotine dependence: Secondary | ICD-10-CM

## 2019-04-12 DIAGNOSIS — K921 Melena: Principal | ICD-10-CM | POA: Diagnosis present

## 2019-04-12 DIAGNOSIS — Z79899 Other long term (current) drug therapy: Secondary | ICD-10-CM

## 2019-04-12 DIAGNOSIS — R42 Dizziness and giddiness: Secondary | ICD-10-CM

## 2019-04-12 DIAGNOSIS — Z8711 Personal history of peptic ulcer disease: Secondary | ICD-10-CM

## 2019-04-12 DIAGNOSIS — Z7982 Long term (current) use of aspirin: Secondary | ICD-10-CM

## 2019-04-12 DIAGNOSIS — D649 Anemia, unspecified: Secondary | ICD-10-CM

## 2019-04-12 DIAGNOSIS — K219 Gastro-esophageal reflux disease without esophagitis: Secondary | ICD-10-CM | POA: Diagnosis present

## 2019-04-12 DIAGNOSIS — Z89511 Acquired absence of right leg below knee: Secondary | ICD-10-CM

## 2019-04-12 DIAGNOSIS — K635 Polyp of colon: Secondary | ICD-10-CM | POA: Diagnosis present

## 2019-04-12 DIAGNOSIS — Z885 Allergy status to narcotic agent status: Secondary | ICD-10-CM

## 2019-04-12 DIAGNOSIS — Z880 Allergy status to penicillin: Secondary | ICD-10-CM

## 2019-04-12 DIAGNOSIS — Z7984 Long term (current) use of oral hypoglycemic drugs: Secondary | ICD-10-CM

## 2019-04-12 DIAGNOSIS — D509 Iron deficiency anemia, unspecified: Secondary | ICD-10-CM | POA: Diagnosis present

## 2019-04-12 DIAGNOSIS — K5730 Diverticulosis of large intestine without perforation or abscess without bleeding: Secondary | ICD-10-CM | POA: Diagnosis present

## 2019-04-12 DIAGNOSIS — E1151 Type 2 diabetes mellitus with diabetic peripheral angiopathy without gangrene: Secondary | ICD-10-CM | POA: Diagnosis present

## 2019-04-12 DIAGNOSIS — E119 Type 2 diabetes mellitus without complications: Secondary | ICD-10-CM

## 2019-04-12 DIAGNOSIS — D62 Acute posthemorrhagic anemia: Secondary | ICD-10-CM | POA: Diagnosis present

## 2019-04-12 DIAGNOSIS — Z7901 Long term (current) use of anticoagulants: Secondary | ICD-10-CM

## 2019-04-12 DIAGNOSIS — Z20828 Contact with and (suspected) exposure to other viral communicable diseases: Secondary | ICD-10-CM | POA: Diagnosis present

## 2019-04-12 DIAGNOSIS — T189XXA Foreign body of alimentary tract, part unspecified, initial encounter: Secondary | ICD-10-CM

## 2019-04-12 DIAGNOSIS — Z7902 Long term (current) use of antithrombotics/antiplatelets: Secondary | ICD-10-CM

## 2019-04-12 DIAGNOSIS — Z90710 Acquired absence of both cervix and uterus: Secondary | ICD-10-CM

## 2019-04-12 LAB — CBC
HCT: 18.1 % — ABNORMAL LOW (ref 36.0–46.0)
Hemoglobin: 6 g/dL — ABNORMAL LOW (ref 12.0–15.0)
MCH: 30.5 pg (ref 26.0–34.0)
MCHC: 33.1 g/dL (ref 30.0–36.0)
MCV: 91.9 fL (ref 80.0–100.0)
Platelets: 329 10*3/uL (ref 150–400)
RBC: 1.97 MIL/uL — ABNORMAL LOW (ref 3.87–5.11)
RDW: 16.1 % — ABNORMAL HIGH (ref 11.5–15.5)
WBC: 10.3 10*3/uL (ref 4.0–10.5)
nRBC: 0 % (ref 0.0–0.2)

## 2019-04-12 LAB — BASIC METABOLIC PANEL
Anion gap: 9 (ref 5–15)
BUN: 13 mg/dL (ref 8–23)
CO2: 24 mmol/L (ref 22–32)
Calcium: 9.6 mg/dL (ref 8.9–10.3)
Chloride: 107 mmol/L (ref 98–111)
Creatinine, Ser: 0.91 mg/dL (ref 0.44–1.00)
GFR calc Af Amer: >60 mL/min (ref >60)
GFR calc non Af Amer: >60 mL/min (ref >60)
Glucose, Bld: 263 mg/dL — ABNORMAL HIGH (ref 70–99)
Potassium: 3.8 mmol/L (ref 3.5–5.1)
Sodium: 140 mmol/L (ref 135–145)

## 2019-04-12 LAB — TROPONIN I (HIGH SENSITIVITY): Troponin I (High Sensitivity): 5 ng/L (ref <18)

## 2019-04-12 MED ORDER — SODIUM CHLORIDE 0.9 % IV SOLN
10.0000 mL/h | Freq: Once | INTRAVENOUS | Status: AC
  Administered 2019-04-16: 10 mL/h via INTRAVENOUS

## 2019-04-12 NOTE — ED Provider Notes (Signed)
[facility] Emergency Department Provider Note   ____________________________________________   First MD Initiated Contact with Patient 04/12/19 2309     (approximate)  I have reviewed the triage vital signs and the nursing notes.   HISTORY  Chief Complaint Dizziness    HPI Sylvia [name] is a 63 y.o. female who presents to the ED from home with a chief complaint of dizziness and black tarry stools.  Patient takes Eliquis for peripheral arterial disease.  History of PUD status post EGD 01/03/2019 which demonstrated nonbleeding ulcer.  Reports a 2-day history of black tarry stools and feeling dizzy like the room is moving around her.  Reports generalized weakness.  Denies fever, cough, chest pain, shortness of breath, abdominal pain, nausea or vomiting.       Past Medical History:  Diagnosis Date  . Anxiety    h/o  . COPD (chronic obstructive pulmonary disease) (HCC)   . Diabetes mellitus without complication (HCC)   . GERD (gastroesophageal reflux disease)   . Hypertension    bp under control-off meds since 2019    Patient Active Problem List   Diagnosis Date Noted  . Acute upper GI bleeding 04/13/2019  . Anemia associated with acute blood loss 04/13/2019  . Chronic anticoagulation 04/13/2019  . 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 Symptomatic anemia 04/13/2019  . Upper GI bleed 04/13/2019  . Acute GI bleeding 04/13/2019  . 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 Blood in stool   . DU (duodenal ulcer)   . DKA (diabetic ketoacidoses) (HCC) 12/14/2018  . Atherosclerosis of artery of extremity with rest pain (HCC) 08/28/2018  . PAD (peripheral artery disease) (HCC) 07/14/2018  . Abdominal pain 06/13/2018  . HTN (hypertension) 06/13/2018  . Non-insulin dependent type 2 diabetes mellitus (HCC) 06/13/2018  . Elevated lactic acid level 06/13/2018    Past Surgical History:  Procedure Laterality Date  . ABDOMINAL  HYSTERECTOMY    . AMPUTATION Right 12/27/2018   Procedure: AMPUTATION BELOW KNEE;  Surgeon: Annice Needy, MD;  Location: [facility];  Service: General;  Laterality: Right;  . CHOLECYSTECTOMY    . ESOPHAGOGASTRODUODENOSCOPY (EGD) WITH PROPOFOL N/A 06/15/2018   Procedure: ESOPHAGOGASTRODUODENOSCOPY (EGD) WITH PROPOFOL;  Surgeon: Toledo, Boykin Nearing, MD;  Location: [facility];  Service: Gastroenterology;  Laterality: N/A;  . ESOPHAGOGASTRODUODENOSCOPY (EGD) WITH PROPOFOL N/A 01/03/2019   Procedure: ESOPHAGOGASTRODUODENOSCOPY (EGD) WITH PROPOFOL;  Surgeon: Midge Minium, MD;  Location: [facility];  Service: Endoscopy;  Laterality: N/A;  . LOWER EXTREMITY ANGIOGRAPHY Right 08/21/2018   Procedure: LOWER EXTREMITY ANGIOGRAPHY;  Surgeon: Annice Needy, MD;  Location: [facility];  Service: Cardiovascular;  Laterality: Right;  . LOWER EXTREMITY ANGIOGRAPHY Left 08/28/2018   Procedure: LOWER EXTREMITY ANGIOGRAPHY;  Surgeon: Annice Needy, MD;  Location: [facility];  Service: Cardiovascular;  Laterality: Left;  . LOWER EXTREMITY ANGIOGRAPHY Right 08/28/2018   Procedure: Lower Extremity Angiography;  Surgeon: Annice Needy, MD;  Location: [facility];  Service: Cardiovascular;  Laterality: Right;  . LOWER EXTREMITY ANGIOGRAPHY Right 12/18/2018   Procedure: Lower Extremity Angiography;  Surgeon: Annice Needy, MD;  Location: [facility];  Service: Cardiovascular;  Laterality: Right;  . LOWER EXTREMITY ANGIOGRAPHY Right 12/18/2018   Procedure: Lower Extremity Angiography;  Surgeon: Annice Needy, MD;  Location: [facility];  Service: Cardiovascular;  Laterality: Right;  . LOWER EXTREMITY ANGIOGRAPHY Left 12/21/2018   Procedure: Lower Extremity Angiography;  Surgeon: Annice Needyew, Jason S, MD;  Location: [facility];  Service: Cardiovascular;  Laterality: Left;  . LOWER EXTREMITY ANGIOGRAPHY Right 12/21/2018   Procedure: Lower Extremity Angiography;  Surgeon: Annice Needyew, Jason S, MD;   Location: [facility];  Service: Cardiovascular;  Laterality: Right;  . LOWER EXTREMITY INTERVENTION N/A 12/22/2018   Procedure: LOWER EXTREMITY INTERVENTION;  Surgeon: Annice Needyew, Jason S, MD;  Location: [facility];  Service: Cardiovascular;  Laterality: N/A;    Prior to Admission medications   Medication Sig Start Date End Date Taking? Authorizing Provider  acetaminophen (TYLENOL) 325 MG tablet Take 650 mg by mouth every 6 (six) hours as needed.   Yes [provider]  albuterol (PROVENTIL HFA;VENTOLIN HFA) 108 (90 Base) MCG/ACT inhaler Inhale 2 puffs into the lungs every 6 (six) hours as needed for wheezing or shortness of breath. 08/20/17  Yes Jeanmarie PlantMcShane, James A, MD  apixaban (ELIQUIS) 2.5 MG TABS tablet Take 1 tablet (2.5 mg total) by mouth 2 (two) times daily. 01/30/19  Yes Georgiana SpinnerBrown, Fallon E, NP  aspirin 81 MG EC tablet Take 1 tablet (81 mg total) by mouth daily. 01/30/19  Yes Georgiana SpinnerBrown, Fallon E, NP  atorvastatin (LIPITOR) 10 MG tablet Take 10 mg by mouth every morning.   Yes [provider]  cetirizine (ZYRTEC) 10 MG tablet Take 10 mg by mouth daily.   Yes [provider]  clopidogrel (PLAVIX) 75 MG tablet Take 75 mg by mouth daily.   Yes [provider]  DULoxetine (CYMBALTA) 30 MG capsule Take 30 mg by mouth daily.   Yes [provider]  gabapentin (NEURONTIN) 300 MG capsule Take 1 capsule (300 mg total) by mouth at bedtime. 01/30/19  Yes Georgiana SpinnerBrown, Fallon E, NP  iron polysaccharides (NIFEREX) 150 MG capsule Take 1 capsule (150 mg total) by mouth daily. 02/07/19  Yes Georgiana SpinnerBrown, Fallon E, NP  lisinopril (ZESTRIL) 5 MG tablet Take 1 tablet (5 mg total) by mouth daily. 01/30/19  Yes Georgiana SpinnerBrown, Fallon E, NP  metoprolol tartrate (LOPRESSOR) 50 MG tablet Take 1 tablet (50 mg total) by mouth 2 (two) times daily. 01/30/19  Yes Georgiana SpinnerBrown, Fallon E, NP  pantoprazole (PROTONIX) 40 MG tablet Take 1 tablet (40 mg total) by mouth daily. Patient taking differently: Take 40  mg by mouth daily. Dennie Bibleat reports taking twice daily. 06/17/18  Yes Ojie, Jude, MD  sitaGLIPtin (JANUVIA) 100 MG tablet Take 100 mg by mouth daily.   Yes [provider]  vitamin C (ASCORBIC ACID) 500 MG tablet Take 500 mg by mouth daily.   Yes [provider]  alum & mag hydroxide-simeth (MAALOX/MYLANTA) 200-200-20 MG/5ML suspension Take 30 mLs by mouth every 4 (four) hours as needed for indigestion or heartburn. Patient not taking: Reported on 01/16/2019 06/16/18   Jama Flavorsjie, Jude, MD  atorvastatin (LIPITOR) 40 MG tablet Take 1 tablet (40 mg total) by mouth daily. Patient not taking: Reported on 04/13/2019 01/30/19   Georgiana SpinnerBrown, Fallon E, NP  Biotin 1000 MCG tablet Take 1 tablet (1 mg total) by mouth daily. Patient not taking: Reported on 04/13/2019 01/05/19   Mayo, Allyn KennerKaty Dodd, MD  cholecalciferol (VITAMIN D3) 25 MCG (1000 UT) tablet Take 1,000 Units by mouth daily.    [provider]  docusate sodium (COLACE) 100 MG capsule Take 100 mg by mouth 2 (two) times daily.    [provider]  HYDROcodone-acetaminophen (NORCO/VICODIN) 5-325 MG tablet Take 1 tablet by mouth every 6 (six) hours as needed for moderate pain. Patient  not taking: Reported on 04/13/2019 02/22/19   Georgiana Spinner, NP  Multiple Vitamins-Minerals (WOMENS MULTIVITAMIN PO) Take 1 tablet by mouth daily.    [provider]  oxyCODONE (OXY IR/ROXICODONE) 5 MG immediate release tablet Take 1 tablet (5 mg total) by mouth every 4 (four) hours as needed for moderate pain. Patient not taking: Reported on 04/13/2019 01/06/19   Mayo, Allyn Kenner, MD  vitamin B-12 (CYANOCOBALAMIN) 100 MCG tablet Take 100 mcg by mouth daily.    [provider]    Allergies Penicillins and Tramadol  Family History  Problem Relation Age of Onset  . Breast cancer Mother 38    Social History Social History   Tobacco Use  . Smoking status: Former Smoker    Packs/day: 0.25    Years: 45.00    Pack years: 11.25     Types: Cigarettes    Quit date: 08/28/2018    Years since quitting: 0.6  . Smokeless tobacco: Never Used  . Tobacco comment: quit  Substance Use Topics  . Alcohol use: No  . Drug use: Never    Review of Systems  Constitutional: No fever/chills Eyes: No visual changes. ENT: No sore throat. Cardiovascular: Denies chest pain. Respiratory: Denies shortness of breath. Gastrointestinal: Positive for black tarry stools.  No abdominal pain.  No nausea, no vomiting.  No diarrhea.  No constipation. Genitourinary: Negative for dysuria. Musculoskeletal: Negative for back pain. Skin: Negative for rash. Neurological: Positive for dizziness.  Negative for headaches, focal weakness or numbness.   ____________________________________________   PHYSICAL EXAM:  VITAL SIGNS: ED Triage Vitals  Enc Vitals Group     BP 04/12/19 2102 (!) 140/111     Pulse Rate 04/12/19 2102 100     Resp 04/12/19 2102 20     Temp 04/12/19 2102 99.1 F (37.3 C)     Temp Source 04/12/19 2102 Oral     SpO2 04/12/19 2102 100 %     Weight 04/12/19 2102 128 lb (58.1 kg)     Height 04/12/19 2102 5\' 7"  (1.702 m)     Head Circumference --      Peak Flow --      Pain Score 04/12/19 2101 0     Pain Loc --      Pain Edu? --      Excl. in GC? --     Constitutional: Alert and oriented.  Weak appearing and in mild acute distress. Eyes: Conjunctivae are normal. PERRL. EOMI. Head: Atraumatic. Nose: No congestion/rhinnorhea. Mouth/Throat: Mucous membranes are moist.  Oropharynx non-erythematous. Neck: No stridor.   Cardiovascular: Normal rate, regular rhythm. Grossly normal heart sounds.  Good peripheral circulation. Respiratory: Normal respiratory effort.  No retractions. Lungs CTAB. Gastrointestinal: Soft and nontender. No distention. No abdominal bruits. No CVA tenderness. Musculoskeletal: Right BKA.  No lower extremity tenderness nor edema.  No joint effusions. Neurologic:  Normal speech and language. No gross  focal neurologic deficits are appreciated.  Skin:  Skin is pale, warm, dry and intact. No rash noted. Psychiatric: Mood and affect are normal. Speech and behavior are normal.  ____________________________________________   LABS (all labs ordered are listed, but only abnormal results are displayed)  Labs Reviewed  BASIC METABOLIC PANEL - Abnormal; Notable for the following components:      Result Value   Glucose, Bld 263 (*)    All other components within normal limits  CBC - Abnormal; Notable for the following components:   RBC 1.97 (*)    Hemoglobin 6.0 (*)  HCT 18.1 (*)    RDW 16.1 (*)    All other components within normal limits  RESPIRATORY PANEL BY RT PCR (FLU A&B, COVID)  URINALYSIS, COMPLETE (UACMP) WITH MICROSCOPIC  TYPE AND SCREEN  PREPARE RBC (CROSSMATCH)  TROPONIN I (HIGH SENSITIVITY)   ____________________________________________  EKG  ED ECG REPORT I, Philip Kotlyar J, the attending physician, personally viewed and interpreted this ECG.   Date: 04/13/2019  EKG Time: 2059  Rate: 95  Rhythm: normal EKG, normal sinus rhythm  Axis: Normal  Intervals:none  ST&T Change: Nonspecific  ____________________________________________  RADIOLOGY  ED MD interpretation: None  Official radiology report(s): No results found.  ____________________________________________   PROCEDURES  Procedure(s) performed (including Critical Care):  Procedures  Rectal exam: External exam within normal limits; black and tarry guaiac positive stool on DRE.  CRITICAL CARE Performed by: Paulette Blanch   Total critical care time: 45 minutes  Critical care time was exclusive of separately billable procedures and treating other patients.  Critical care was necessary to treat or prevent imminent or life-threatening deterioration.  Critical care was time spent personally by me on the following activities: development of treatment plan with patient and/or surrogate as well as  nursing, discussions with consultants, evaluation of patient's response to treatment, examination of patient, obtaining history from patient or surrogate, ordering and performing treatments and interventions, ordering and review of laboratory studies, ordering and review of radiographic studies, pulse oximetry and re-evaluation of patient's condition.   ____________________________________________   INITIAL IMPRESSION / ASSESSMENT AND PLAN / ED COURSE  As part of my medical decision making, I reviewed the following data within the [place] notes reviewed and incorporated, Labs reviewed, EKG interpreted, Old chart reviewed, Radiograph reviewed, Discussed with admitting physician and Notes from prior ED visits     Sylvia [name] was evaluated in Emergency Department on 04/13/2019 for the symptoms described in the history of present illness. She was evaluated in the context of the global COVID-19 pandemic, which necessitated consideration that the patient might be at risk for infection with the SARS-CoV-2 virus that causes COVID-19. Institutional protocols and algorithms that pertain to the evaluation of patients at risk for COVID-19 are in a state of rapid change based on information released by regulatory bodies including the CDC and federal and state organizations. These policies and algorithms were followed during the patient's care in the ED.    63 year old female on Eliquis presenting with black tarry stools and dizziness.  Differential diagnosis includes but is not limited to PUD, GIB, vertigo, CVA, metabolic, infectious etiologies, etc.  H/H is 6/18.1.  Will transfuse 2 units PRBCs.  Initiate Protonix bolus and drip.  Will discuss with hospitalist services to evaluate patient in emergency department for admission.  Rapid Covid swab obtained in the event patient requires GI procedure.      ____________________________________________   FINAL CLINICAL  IMPRESSION(S) / ED DIAGNOSES  Final diagnoses:  Dizziness  Gastrointestinal hemorrhage, unspecified gastrointestinal hemorrhage type  Anemia, unspecified type     ED Discharge Orders    None       Note:  This document was prepared using Dragon voice recognition software and may include unintentional dictation errors.   Paulette Blanch, MD 04/13/19 [phone]

## 2019-04-12 NOTE — ED Notes (Signed)
Paper copy of Blood consent signed by patient and witnessed by this writer and placed in chart.

## 2019-04-12 NOTE — ED Triage Notes (Addendum)
Patient to the ER from home via [facility] for c/o dizziness. Patient has h/o vertigo, but states symptoms have not gone away. Patient states she feels like room is moving and she is moving. Patient also c/o black stools.  EMS vitals: 115/51, HR 103, 20R, 100% on RA. Patient on Eliquis.

## 2019-04-13 ENCOUNTER — Inpatient Hospital Stay: Payer: Medicaid Other | Admitting: Certified Registered Nurse Anesthetist

## 2019-04-13 ENCOUNTER — Encounter: Payer: Self-pay | Admitting: Internal Medicine

## 2019-04-13 ENCOUNTER — Encounter
Admission: EM | Disposition: A | Discharge status: Home Health | Payer: Self-pay | Source: Home / Self Care | Attending: Internal Medicine

## 2019-04-13 ENCOUNTER — Other Ambulatory Visit: Payer: Self-pay

## 2019-04-13 DIAGNOSIS — E1151 Type 2 diabetes mellitus with diabetic peripheral angiopathy without gangrene: Secondary | ICD-10-CM | POA: Diagnosis present

## 2019-04-13 DIAGNOSIS — D62 Acute posthemorrhagic anemia: Secondary | ICD-10-CM

## 2019-04-13 DIAGNOSIS — D509 Iron deficiency anemia, unspecified: Secondary | ICD-10-CM | POA: Diagnosis present

## 2019-04-13 DIAGNOSIS — J449 Chronic obstructive pulmonary disease, unspecified: Secondary | ICD-10-CM | POA: Diagnosis present

## 2019-04-13 DIAGNOSIS — Z9049 Acquired absence of other specified parts of digestive tract: Secondary | ICD-10-CM | POA: Diagnosis not present

## 2019-04-13 DIAGNOSIS — K635 Polyp of colon: Secondary | ICD-10-CM | POA: Diagnosis present

## 2019-04-13 DIAGNOSIS — K921 Melena: Secondary | ICD-10-CM | POA: Diagnosis present

## 2019-04-13 DIAGNOSIS — D649 Anemia, unspecified: Secondary | ICD-10-CM

## 2019-04-13 DIAGNOSIS — R42 Dizziness and giddiness: Secondary | ICD-10-CM | POA: Diagnosis present

## 2019-04-13 DIAGNOSIS — Z79899 Other long term (current) drug therapy: Secondary | ICD-10-CM | POA: Diagnosis not present

## 2019-04-13 DIAGNOSIS — Z89511 Acquired absence of right leg below knee: Secondary | ICD-10-CM | POA: Diagnosis not present

## 2019-04-13 DIAGNOSIS — Z90710 Acquired absence of both cervix and uterus: Secondary | ICD-10-CM | POA: Diagnosis not present

## 2019-04-13 DIAGNOSIS — Z20828 Contact with and (suspected) exposure to other viral communicable diseases: Secondary | ICD-10-CM | POA: Diagnosis present

## 2019-04-13 DIAGNOSIS — Z880 Allergy status to penicillin: Secondary | ICD-10-CM | POA: Diagnosis not present

## 2019-04-13 DIAGNOSIS — K922 Gastrointestinal hemorrhage, unspecified: Secondary | ICD-10-CM

## 2019-04-13 DIAGNOSIS — Z7901 Long term (current) use of anticoagulants: Secondary | ICD-10-CM

## 2019-04-13 DIAGNOSIS — Z7982 Long term (current) use of aspirin: Secondary | ICD-10-CM | POA: Diagnosis not present

## 2019-04-13 DIAGNOSIS — Z87891 Personal history of nicotine dependence: Secondary | ICD-10-CM | POA: Diagnosis not present

## 2019-04-13 DIAGNOSIS — I739 Peripheral vascular disease, unspecified: Secondary | ICD-10-CM

## 2019-04-13 DIAGNOSIS — Z8711 Personal history of peptic ulcer disease: Secondary | ICD-10-CM | POA: Diagnosis not present

## 2019-04-13 DIAGNOSIS — I10 Essential (primary) hypertension: Secondary | ICD-10-CM | POA: Diagnosis present

## 2019-04-13 DIAGNOSIS — K219 Gastro-esophageal reflux disease without esophagitis: Secondary | ICD-10-CM | POA: Diagnosis present

## 2019-04-13 DIAGNOSIS — Z885 Allergy status to narcotic agent status: Secondary | ICD-10-CM | POA: Diagnosis not present

## 2019-04-13 DIAGNOSIS — Z7984 Long term (current) use of oral hypoglycemic drugs: Secondary | ICD-10-CM | POA: Diagnosis not present

## 2019-04-13 DIAGNOSIS — K5730 Diverticulosis of large intestine without perforation or abscess without bleeding: Secondary | ICD-10-CM | POA: Diagnosis present

## 2019-04-13 DIAGNOSIS — Z7902 Long term (current) use of antithrombotics/antiplatelets: Secondary | ICD-10-CM | POA: Diagnosis not present

## 2019-04-13 DIAGNOSIS — F419 Anxiety disorder, unspecified: Secondary | ICD-10-CM | POA: Diagnosis present

## 2019-04-13 LAB — CBC WITH DIFFERENTIAL/PLATELET
Abs Immature Granulocytes: 0.03 10*3/uL (ref 0.00–0.07)
Basophils Absolute: 0.1 10*3/uL (ref 0.0–0.1)
Basophils Relative: 1 %
Eosinophils Absolute: 0.3 10*3/uL (ref 0.0–0.5)
Eosinophils Relative: 4 %
HCT: 25.9 % — ABNORMAL LOW (ref 36.0–46.0)
Hemoglobin: 8.8 g/dL — ABNORMAL LOW (ref 12.0–15.0)
Immature Granulocytes: 0 %
Lymphocytes Relative: 24 %
Lymphs Abs: 2.1 10*3/uL (ref 0.7–4.0)
MCH: 29.5 pg (ref 26.0–34.0)
MCHC: 34 g/dL (ref 30.0–36.0)
MCV: 86.9 fL (ref 80.0–100.0)
Monocytes Absolute: 0.8 10*3/uL (ref 0.1–1.0)
Monocytes Relative: 9 %
Neutro Abs: 5.4 10*3/uL (ref 1.7–7.7)
Neutrophils Relative %: 62 %
Platelets: 270 10*3/uL (ref 150–400)
RBC: 2.98 MIL/uL — ABNORMAL LOW (ref 3.87–5.11)
RDW: 14.8 % (ref 11.5–15.5)
WBC: 8.7 10*3/uL (ref 4.0–10.5)
nRBC: 0 % (ref 0.0–0.2)

## 2019-04-13 LAB — RESPIRATORY PANEL BY RT PCR (FLU A&B, COVID)
Influenza A by PCR: NEGATIVE
Influenza B by PCR: NEGATIVE
SARS Coronavirus 2 by RT PCR: NEGATIVE

## 2019-04-13 LAB — PREPARE RBC (CROSSMATCH)

## 2019-04-13 SURGERY — ESOPHAGOGASTRODUODENOSCOPY (EGD) WITH PROPOFOL
Anesthesia: General

## 2019-04-13 SURGERY — EGD (ESOPHAGOGASTRODUODENOSCOPY)
Anesthesia: General

## 2019-04-13 MED ORDER — ONDANSETRON HCL 4 MG PO TABS
4.0000 mg | ORAL_TABLET | Freq: Four times a day (QID) | ORAL | Status: DC | PRN
  Administered 2019-04-17: 4 mg via ORAL
  Filled 2019-04-13: qty 1

## 2019-04-13 MED ORDER — SODIUM CHLORIDE 0.9% IV SOLUTION
Freq: Once | INTRAVENOUS | Status: DC
  Filled 2019-04-13: qty 250

## 2019-04-13 MED ORDER — ONDANSETRON HCL 4 MG/2ML IJ SOLN
4.0000 mg | Freq: Four times a day (QID) | INTRAMUSCULAR | Status: DC | PRN
  Administered 2019-04-13: 4 mg via INTRAVENOUS
  Filled 2019-04-13 (×2): qty 2

## 2019-04-13 MED ORDER — SUCCINYLCHOLINE CHLORIDE 20 MG/ML IJ SOLN
INTRAMUSCULAR | Status: AC
  Filled 2019-04-13: qty 1

## 2019-04-13 MED ORDER — SODIUM CHLORIDE 0.9 % IV SOLN
80.0000 mg | Freq: Once | INTRAVENOUS | Status: AC
  Administered 2019-04-13: 80 mg via INTRAVENOUS
  Filled 2019-04-13: qty 80

## 2019-04-13 MED ORDER — PROPOFOL 500 MG/50ML IV EMUL
INTRAVENOUS | Status: AC
  Filled 2019-04-13: qty 50

## 2019-04-13 MED ORDER — DEXAMETHASONE SODIUM PHOSPHATE 4 MG/ML IJ SOLN
INTRAMUSCULAR | Status: AC
  Filled 2019-04-13: qty 1

## 2019-04-13 MED ORDER — LIDOCAINE HCL (CARDIAC) PF 100 MG/5ML IV SOSY
PREFILLED_SYRINGE | INTRAVENOUS | Status: DC | PRN
  Administered 2019-04-13: 50 mg via INTRAVENOUS

## 2019-04-13 MED ORDER — PROPOFOL 10 MG/ML IV BOLUS
INTRAVENOUS | Status: DC | PRN
  Administered 2019-04-13: 150 mg via INTRAVENOUS

## 2019-04-13 MED ORDER — ONDANSETRON HCL 4 MG/2ML IJ SOLN
INTRAMUSCULAR | Status: DC | PRN
  Administered 2019-04-13: 4 mg via INTRAVENOUS

## 2019-04-13 MED ORDER — SUCCINYLCHOLINE CHLORIDE 20 MG/ML IJ SOLN
INTRAMUSCULAR | Status: DC | PRN
  Administered 2019-04-13: 100 mg via INTRAVENOUS

## 2019-04-13 MED ORDER — DEXAMETHASONE SODIUM PHOSPHATE 10 MG/ML IJ SOLN
INTRAMUSCULAR | Status: DC | PRN
  Administered 2019-04-13: 10 mg via INTRAVENOUS

## 2019-04-13 MED ORDER — SODIUM CHLORIDE 0.9 % IV SOLN
8.0000 mg/h | INTRAVENOUS | Status: DC
  Administered 2019-04-13 – 2019-04-14 (×3): 8 mg/h via INTRAVENOUS
  Filled 2019-04-13 (×3): qty 80

## 2019-04-13 MED ORDER — ONDANSETRON HCL 4 MG/2ML IJ SOLN
INTRAMUSCULAR | Status: AC
  Filled 2019-04-13: qty 2

## 2019-04-13 MED ORDER — SODIUM CHLORIDE 0.9 % IV SOLN: INTRAVENOUS | Status: DC

## 2019-04-13 MED ORDER — PROMETHAZINE HCL 25 MG/ML IJ SOLN
25.0000 mg | Freq: Four times a day (QID) | INTRAMUSCULAR | Status: DC | PRN
  Administered 2019-04-13 – 2019-04-17 (×2): 25 mg via INTRAVENOUS
  Filled 2019-04-13 (×2): qty 1

## 2019-04-13 NOTE — Consult Note (Signed)
Midge Minium, MD [facility]  [address], [address] [place], [place] [address] Phone: [phone] Fax : [phone]  Consultation  Referring Provider:      Primary Care Physician:  [facility] Primary Gastroenterologist:  Dr. Servando Snare         Reason for Consultation:     Melena and anemia  Date of Admission:  04/12/2019 Date of Consultation:  04/13/2019         HPI:   Sylvia [name] is a 63 y.o. female who has been seen by me in the past with an upper endoscopy back in September of this year that showed a duodenal bulb ulcer.  The patient states that she has been having black stools for the last week.  She has been on Eliquis but denies any use of NSAIDs.  The patient hemoglobin back in September was around 9 and she came in yesterday with a hemoglobin of 6.  After transfusion this morning she is up to 8.8.  The patient denies any abdominal pain nausea vomiting fevers or chills.  The patient also denies any signs of bleeding before the black stools started. On admission the patient states she was feeling weak and dizzy in addition to having a black tarry stools.  She is on the Eliquis for peripheral vascular disease.  Besides the Eliquis the patient is also on an 81 mg aspirin.  I am now being asked to see the patient for black stools and anemia.  Past Medical History:  Diagnosis Date  . Anxiety    h/o  . COPD (chronic obstructive pulmonary disease) (HCC)   . Diabetes mellitus without complication (HCC)   . GERD (gastroesophageal reflux disease)   . Hypertension    bp under control-off meds since 2019    Past Surgical History:  Procedure Laterality Date  . ABDOMINAL HYSTERECTOMY    . AMPUTATION Right 12/27/2018   Procedure: AMPUTATION BELOW KNEE;  Surgeon: Annice Needy, MD;  Location: [facility] ORS;  Service: General;  Laterality: Right;  . CHOLECYSTECTOMY    . ESOPHAGOGASTRODUODENOSCOPY (EGD) WITH PROPOFOL N/A 06/15/2018   Procedure: ESOPHAGOGASTRODUODENOSCOPY  (EGD) WITH PROPOFOL;  Surgeon: Toledo, Boykin Nearing, MD;  Location: [facility] ENDOS[facility];  Service: Gastroenterology;  Laterality: N/A;  . ESOPHAGOGASTRODUODENOSCOPY (EGD) WITH PROPOFOL N/A 01/03/2019   Procedure: ESOPHAGOGASTRODUODENOSCOPY (EGD) WITH PROPOFOL;  Surgeon: Midge Minium, MD;  Location: [facility] ENDOS[facility];  Service: Endoscopy;  Laterality: N/A;  . LOWER EXTREMITY ANGIOGRAPHY Right 08/21/2018   Procedure: LOWER EXTREMITY ANGIOGRAPHY;  Surgeon: Annice Needy, MD;  Location: [facility];  Service: Cardiovascular;  Laterality: Right;  . LOWER EXTREMITY ANGIOGRAPHY Left 08/28/2018   Procedure: LOWER EXTREMITY ANGIOGRAPHY;  Surgeon: Annice Needy, MD;  Location: [facility];  Service: Cardiovascular;  Laterality: Left;  . LOWER EXTREMITY ANGIOGRAPHY Right 08/28/2018   Procedure: Lower Extremity Angiography;  Surgeon: Annice Needy, MD;  Location: [facility];  Service: Cardiovascular;  Laterality: Right;  . LOWER EXTREMITY ANGIOGRAPHY Right 12/18/2018   Procedure: Lower Extremity Angiography;  Surgeon: Annice Needy, MD;  Location: [facility];  Service: Cardiovascular;  Laterality: Right;  . LOWER EXTREMITY ANGIOGRAPHY Right 12/18/2018   Procedure: Lower Extremity Angiography;  Surgeon: Annice Needy, MD;  Location: [facility];  Service: Cardiovascular;  Laterality: Right;  . LOWER EXTREMITY ANGIOGRAPHY Left 12/21/2018   Procedure: Lower Extremity Angiography;  Surgeon: Annice Needy, MD;  Location: [facility];  Service: Cardiovascular;  Laterality: Left;  . LOWER EXTREMITY ANGIOGRAPHY Right 12/21/2018   Procedure: Lower Extremity Angiography;  Surgeon: Annice Needy, MD;  Location: [facility];  Service: Cardiovascular;  Laterality: Right;  . LOWER EXTREMITY INTERVENTION N/A 12/22/2018   Procedure: LOWER EXTREMITY INTERVENTION;  Surgeon: Annice Needy, MD;  Location: [facility];  Service: Cardiovascular;  Laterality: N/A;    Prior to Admission  medications   Medication Sig Start Date End Date Taking? Authorizing Provider  acetaminophen (TYLENOL) 325 MG tablet Take 650 mg by mouth every 6 (six) hours as needed.   Yes [provider]  albuterol (PROVENTIL HFA;VENTOLIN HFA) 108 (90 Base) MCG/ACT inhaler Inhale 2 puffs into the lungs every 6 (six) hours as needed for wheezing or shortness of breath. 08/20/17  Yes Jeanmarie Plant, MD  apixaban (ELIQUIS) 2.5 MG TABS tablet Take 1 tablet (2.5 mg total) by mouth 2 (two) times daily. 01/30/19  Yes Georgiana Spinner, NP  aspirin 81 MG EC tablet Take 1 tablet (81 mg total) by mouth daily. 01/30/19  Yes Georgiana Spinner, NP  atorvastatin (LIPITOR) 10 MG tablet Take 10 mg by mouth every morning.   Yes [provider]  cetirizine (ZYRTEC) 10 MG tablet Take 10 mg by mouth daily.   Yes [provider]  clopidogrel (PLAVIX) 75 MG tablet Take 75 mg by mouth daily.   Yes [provider]  DULoxetine (CYMBALTA) 30 MG capsule Take 30 mg by mouth daily.   Yes [provider]  gabapentin (NEURONTIN) 300 MG capsule Take 1 capsule (300 mg total) by mouth at bedtime. 01/30/19  Yes Georgiana Spinner, NP  iron polysaccharides (NIFEREX) 150 MG capsule Take 1 capsule (150 mg total) by mouth daily. 02/07/19  Yes Georgiana Spinner, NP  lisinopril (ZESTRIL) 5 MG tablet Take 1 tablet (5 mg total) by mouth daily. 01/30/19  Yes Georgiana Spinner, NP  metoprolol tartrate (LOPRESSOR) 50 MG tablet Take 1 tablet (50 mg total) by mouth 2 (two) times daily. 01/30/19  Yes Georgiana Spinner, NP  pantoprazole (PROTONIX) 40 MG tablet Take 1 tablet (40 mg total) by mouth daily. Patient taking differently: Take 40 mg by mouth daily. Dennie Bible reports taking twice daily. 06/17/18  Yes Ojie, Jude, MD  sitaGLIPtin (JANUVIA) 100 MG tablet Take 100 mg by mouth daily.   Yes [provider]  vitamin C (ASCORBIC ACID) 500 MG tablet Take 500 mg by mouth daily.   Yes [provider]  alum & mag  hydroxide-simeth (MAALOX/MYLANTA) 200-200-20 MG/5ML suspension Take 30 mLs by mouth every 4 (four) hours as needed for indigestion or heartburn. Patient not taking: Reported on 01/16/2019 06/16/18   Jama Flavors, MD  atorvastatin (LIPITOR) 40 MG tablet Take 1 tablet (40 mg total) by mouth daily. Patient not taking: Reported on 04/13/2019 01/30/19   Georgiana Spinner, NP  Biotin 1000 MCG tablet Take 1 tablet (1 mg total) by mouth daily. Patient not taking: Reported on 04/13/2019 01/05/19   Mayo, Allyn Kenner, MD  cholecalciferol (VITAMIN D3) 25 MCG (1000 UT) tablet Take 1,000 Units by mouth daily.    [provider]  docusate sodium (COLACE) 100 MG capsule Take 100 mg by mouth 2 (two) times daily.    [provider]  HYDROcodone-acetaminophen (NORCO/VICODIN) 5-325 MG tablet Take 1 tablet by mouth every 6 (six) hours as needed for moderate pain. Patient not taking: Reported on 04/13/2019 02/22/19   Georgiana Spinner, NP  Multiple Vitamins-Minerals (  WOMENS MULTIVITAMIN PO) Take 1 tablet by mouth daily.    [provider]  oxyCODONE (OXY IR/ROXICODONE) 5 MG immediate release tablet Take 1 tablet (5 mg total) by mouth every 4 (four) hours as needed for moderate pain. Patient not taking: Reported on 04/13/2019 01/06/19   Mayo, Pete Pelt, MD  vitamin B-12 (CYANOCOBALAMIN) 100 MCG tablet Take 100 mcg by mouth daily.    [provider]    Family History  Problem Relation Age of Onset  . Breast cancer Mother 57     Social History   Tobacco Use  . Smoking status: Former Smoker    Packs/day: 0.25    Years: 45.00    Pack years: 11.25    Types: Cigarettes    Quit date: 08/28/2018    Years since quitting: 0.6  . Smokeless tobacco: Never Used  . Tobacco comment: quit  Substance Use Topics  . Alcohol use: No  . Drug use: Never    Allergies as of 04/12/2019 - Review Complete 04/12/2019  Allergen Reaction Noted  . Penicillins Hives 11/14/2015  . Tramadol Itching 12/14/2018     Review of Systems:    All systems reviewed and negative except where noted in HPI.   Physical Exam:  Vital signs in last 24 hours: Temp:  [97.9 F (36.6 C)-99.1 F (37.3 C)] 98.1 F (36.7 C) (12/25 0734) Pulse Rate:  [68-100] 81 (12/25 0734) Resp:  [9-32] 18 (12/25 0734) BP: (89-154)/(59-111) 149/85 (12/25 0734) SpO2:  [97 %-100 %] 100 % (12/25 0734) Weight:  [58.1 kg] 58.1 kg (12/24 2102)   General:   Pleasant, cooperative in NAD Head:  Normocephalic and atraumatic. Eyes:   No icterus.   Conjunctiva pink. PERRLA. Ears:  Normal auditory acuity. Neck:  Supple; no masses or thyroidomegaly Lungs: Respirations even and unlabored. Lungs clear to auscultation bilaterally.   No wheezes, crackles, or rhonchi.  Heart:  Regular rate and rhythm;  Without murmur, clicks, rubs or gallops Abdomen:  Soft, nondistended, nontender. Normal bowel sounds. No appreciable masses or hepatomegaly.  No rebound or guarding.  Rectal:  Not performed. Msk:  Symmetrical without gross deformities.    Extremities:  Without edema, cyanosis or clubbing. Neurologic:  Alert and oriented x3;  grossly normal neurologically. Skin:  Intact without significant lesions or rashes. Cervical Nodes:  No significant cervical adenopathy. Psych:  Alert and cooperative. Normal affect.  LAB RESULTS: Recent Labs    04/12/19 2109 04/13/19 0900  WBC 10.3 8.7  HGB 6.0* 8.8*  HCT 18.1* 25.9*  PLT 329 270   BMET Recent Labs    04/12/19 2109  NA 140  K 3.8  CL 107  CO2 24  GLUCOSE 263*  BUN 13  CREATININE 0.91  CALCIUM 9.6   LFT No results for input(s): PROT, ALBUMIN, AST, ALT, ALKPHOS, BILITOT, BILIDIR, IBILI in the last 72 hours. PT/INR No results for input(s): LABPROT, INR in the last 72 hours.  STUDIES: No results found.    Impression / Plan:   Assessment: Principal Problem:   Acute upper GI bleeding Active Problems:   Non-insulin dependent type 2 diabetes mellitus (HCC)   PAD (peripheral  artery disease) (HCC)   Anemia associated with acute blood loss   Chronic anticoagulation   Hx of right BKA (HCC)   Symptomatic anemia   Upper GI bleed   Acute GI bleeding   Sylvia [name] is a 63 y.o. y/o female with with black stools and symptomatic anemia.  The patient has been transfused  and is now feeling much better than she did when she was admitted.  The patient has a history of peptic ulcer disease and takes an aspirin that is low-dose daily plus Eliquis.  Plan:  Due to the patient's drop in hemoglobin and black stools the patient will be set up for an upper endoscopy for today.  The patient has been kept n.p.o. since midnight.  The patient has been explained the plan and agrees with it.  Thank you for involving me in the care of this patient.      LOS: 0 days   Midge Miniumarren Niraj Kudrna, MD  04/13/2019, 11:16 AM Pager [phone] 7am-5pm  Check AMION for 5pm -7am coverage and on weekends   Note: This dictation was prepared with Dragon dictation along with smaller phrase technology. Any transcriptional errors that result from this process are unintentional.

## 2019-04-13 NOTE — OR Nursing (Signed)
Pt. In NAD with VSS and blood glucose checked. Pt. Awake and talking with no complaints.

## 2019-04-13 NOTE — H&P (Signed)
History and Physical    Sylvia [name] QJJ:[id number] DOB: Mar 27, 1956 DOA: 04/12/2019  PCP: [facility] [place]  Patient coming from: home  I have personally briefly reviewed patient's old medical records in [place]  Chief Complaint: dizziness  HPI: Sylvia [name] is a 63 y.o. female on chronic anticoagulation with Eliquis due to severe peripheral vascular disease, with history of right BKA, s medical history significant of COPD, diabetes, hypertension,  and history of peptic ulcer disease history of hospitalization September 2020 with acute upper GI bleed requiring 4 units of packed red blood cells with gastric ulcer seen on upper endoscopy, who presents to the emergency room with dizziness and 1 week history of intermittent stool most recently the day prior to arrival .  Denies chest pain or shortness of breath.  Denies abdominal pain.  On arrival in the emergency room blood pressure initially 149/70 with a heart rate of 89 .hemoglobin was 6.0.  She was typed and crossed started on a unit of blood hospitalist consulted for admission.  Patient was having no active bleeding at the time of admission   Review of Systems: As per HPI otherwise 10 point review of systems negative.    Past Medical History:  Diagnosis Date  . Anxiety    h/o  . COPD (chronic obstructive pulmonary disease) ([place])   . Diabetes mellitus without complication ([place])   . GERD (gastroesophageal reflux disease)   . Hypertension    bp under control-off meds since 2019    Past Surgical History:  Procedure Laterality Date  . ABDOMINAL HYSTERECTOMY    . AMPUTATION Right 12/27/2018   Procedure: AMPUTATION BELOW KNEE;  Surgeon: Algernon Huxley, MD;  Location: [facility];  Service: General;  Laterality: Right;  . CHOLECYSTECTOMY    . ESOPHAGOGASTRODUODENOSCOPY (EGD) WITH PROPOFOL N/A 06/15/2018   Procedure: ESOPHAGOGASTRODUODENOSCOPY (EGD) WITH PROPOFOL;  Surgeon: Toledo, Benay Pike, MD;   Location: [facility];  Service: Gastroenterology;  Laterality: N/A;  . ESOPHAGOGASTRODUODENOSCOPY (EGD) WITH PROPOFOL N/A 01/03/2019   Procedure: ESOPHAGOGASTRODUODENOSCOPY (EGD) WITH PROPOFOL;  Surgeon: Lucilla Lame, MD;  Location: [facility];  Service: Endoscopy;  Laterality: N/A;  . LOWER EXTREMITY ANGIOGRAPHY Right 08/21/2018   Procedure: LOWER EXTREMITY ANGIOGRAPHY;  Surgeon: Algernon Huxley, MD;  Location: [place] [facility];  Service: Cardiovascular;  Laterality: Right;  . LOWER EXTREMITY ANGIOGRAPHY Left 08/28/2018   Procedure: LOWER EXTREMITY ANGIOGRAPHY;  Surgeon: Algernon Huxley, MD;  Location: [place] [facility];  Service: Cardiovascular;  Laterality: Left;  . LOWER EXTREMITY ANGIOGRAPHY Right 08/28/2018   Procedure: Lower Extremity Angiography;  Surgeon: Algernon Huxley, MD;  Location: [place] [facility];  Service: Cardiovascular;  Laterality: Right;  . LOWER EXTREMITY ANGIOGRAPHY Right 12/18/2018   Procedure: Lower Extremity Angiography;  Surgeon: Algernon Huxley, MD;  Location: [place] [facility];  Service: Cardiovascular;  Laterality: Right;  . LOWER EXTREMITY ANGIOGRAPHY Right 12/18/2018   Procedure: Lower Extremity Angiography;  Surgeon: Algernon Huxley, MD;  Location: [place] [facility];  Service: Cardiovascular;  Laterality: Right;  . LOWER EXTREMITY ANGIOGRAPHY Left 12/21/2018   Procedure: Lower Extremity Angiography;  Surgeon: Algernon Huxley, MD;  Location: [place] [facility];  Service: Cardiovascular;  Laterality: Left;  . LOWER EXTREMITY ANGIOGRAPHY Right 12/21/2018   Procedure: Lower Extremity Angiography;  Surgeon: Algernon Huxley, MD;  Location: [place] [facility];  Service: Cardiovascular;  Laterality: Right;  . LOWER EXTREMITY INTERVENTION N/A 12/22/2018   Procedure: LOWER EXTREMITY INTERVENTION;  Surgeon:  Annice Needy, MD;  Location: [facility];  Service: Cardiovascular;  Laterality: N/A;     reports that she quit smoking about 7 months ago. Her smoking use included  cigarettes. She has a 11.25 pack-year smoking history. She has never used smokeless tobacco. She reports that she does not drink alcohol or use drugs.  Allergies  Allergen Reactions  . Penicillins Hives    Has patient had a PCN reaction causing immediate rash, facial/tongue/throat swelling, SOB or lightheadedness with hypotension: Yes Has patient had a PCN reaction causing severe rash involving mucus membranes or skin necrosis: No Has patient had a PCN reaction that required hospitalization: No Has patient had a PCN reaction occurring within the last 10 years: No If all of the above answers are "NO", then may proceed with Cephalosporin use.  . Tramadol Itching    Family History  Problem Relation Age of Onset  . Breast cancer Mother 75     Prior to Admission medications   Medication Sig Start Date End Date Taking? Authorizing Provider  acetaminophen (TYLENOL) 325 MG tablet Take 650 mg by mouth every 6 (six) hours as needed.    [provider]  albuterol (PROVENTIL HFA;VENTOLIN HFA) 108 (90 Base) MCG/ACT inhaler Inhale 2 puffs into the lungs every 6 (six) hours as needed for wheezing or shortness of breath. 08/20/17   Jeanmarie Plant, MD  alum & mag hydroxide-simeth (MAALOX/MYLANTA) 200-200-20 MG/5ML suspension Take 30 mLs by mouth every 4 (four) hours as needed for indigestion or heartburn. Patient not taking: Reported on 01/16/2019 06/16/18   Jama Flavors, MD  apixaban (ELIQUIS) 2.5 MG TABS tablet Take 1 tablet (2.5 mg total) by mouth 2 (two) times daily. 01/30/19   Georgiana Spinner, NP  aspirin 81 MG EC tablet Take 1 tablet (81 mg total) by mouth daily. 01/30/19   Georgiana Spinner, NP  atorvastatin (LIPITOR) 40 MG tablet Take 1 tablet (40 mg total) by mouth daily. 01/30/19   Georgiana Spinner, NP  Biotin 1000 MCG tablet Take 1 tablet (1 mg total) by mouth daily. 01/05/19   Mayo, Allyn Kenner, MD  cetirizine (ZYRTEC) 10 MG tablet Take 10 mg by mouth daily.    [provider]   cholecalciferol (VITAMIN D3) 25 MCG (1000 UT) tablet Take 1,000 Units by mouth daily.    [provider]  docusate sodium (COLACE) 100 MG capsule Take 100 mg by mouth 2 (two) times daily.    [provider]  DULoxetine (CYMBALTA) 30 MG capsule Take 30 mg by mouth daily.    [provider]  gabapentin (NEURONTIN) 300 MG capsule Take 1 capsule (300 mg total) by mouth at bedtime. 01/30/19   Georgiana Spinner, NP  HYDROcodone-acetaminophen (NORCO/VICODIN) 5-325 MG tablet Take 1 tablet by mouth every 6 (six) hours as needed for moderate pain. 02/22/19   Georgiana Spinner, NP  iron polysaccharides (NIFEREX) 150 MG capsule Take 1 capsule (150 mg total) by mouth daily. 02/07/19   Georgiana Spinner, NP  lisinopril (ZESTRIL) 5 MG tablet Take 1 tablet (5 mg total) by mouth daily. 01/30/19   Georgiana Spinner, NP  metoprolol tartrate (LOPRESSOR) 50 MG tablet Take 1 tablet (50 mg total) by mouth 2 (two) times daily. 01/30/19   Georgiana Spinner, NP  Multiple Vitamins-Minerals (WOMENS MULTIVITAMIN PO) Take 1 tablet by mouth daily.    [provider]  oxyCODONE (OXY IR/ROXICODONE) 5 MG immediate release tablet Take 1 tablet (5 mg total)  by mouth every 4 (four) hours as needed for moderate pain. 01/06/19   Mayo, Allyn KennerKaty Dodd, MD  pantoprazole (PROTONIX) 40 MG tablet Take 1 tablet (40 mg total) by mouth daily. Patient taking differently: Take 40 mg by mouth daily. Dennie Bibleat reports taking twice daily. 06/17/18   Enid Baasjie, Jude, MD  sitaGLIPtin (JANUVIA) 100 MG tablet Take 100 mg by mouth daily.    [provider]  vitamin B-12 (CYANOCOBALAMIN) 100 MCG tablet Take 100 mcg by mouth daily.    [provider]  vitamin C (ASCORBIC ACID) 500 MG tablet Take 500 mg by mouth daily.    [provider]    Physical Exam: Vitals:   04/12/19 2102  BP: (!) 140/111  Pulse: 100  Resp: 20  Temp: 99.1 F (37.3 C)  TempSrc: Oral  SpO2: 100%  Weight: 58.1 kg  Height: 5\' 7"  (1.702 m)      Vitals:   04/12/19 2102  BP: (!) 140/111  Pulse: 100  Resp: 20  Temp: 99.1 F (37.3 C)  TempSrc: Oral  SpO2: 100%  Weight: 58.1 kg  Height: 5\' 7"  (1.702 m)    Constitutional: NAD, calm, comfortableEyes: PERRL, lids and conjunctivae normal ENMT: Mucous membranes are moist. Posterior pharynx clear of any exudate or lesions.Normal dentition.  Neck: normal, supple, no masses, no thyromegaly Respiratory: clear to auscultation bilaterally, no wheezing, no crackles. Normal respiratory effort. No accessory muscle use.  Cardiovascular: Regular rate and rhythm, no murmurs / rubs / gallops. No extremity edema. 2+ pedal pulses. No carotid bruits.  Abdomen: no tenderness, no masses palpated. No hepatosplenomegaly. Bowel sounds positive.  Musculoskeletal: no clubbing / cyanosis.   right BKA Skin: no rashes, lesions, ulcers. No induration Neurologic: no Gross focal neurologic deficit Psychiatric: Normal judgment and insight. Alert and oriented x 3. Normal mood.    Labs on Admission: I have personally reviewed following labs and imaging studies  CBC: Recent Labs  Lab 04/12/19 2109  WBC 10.3  HGB 6.0*  HCT 18.1*  MCV 91.9  PLT 329   Basic Metabolic Panel: Recent Labs  Lab 04/12/19 2109  NA 140  K 3.8  CL 107  CO2 24  GLUCOSE 263*  BUN 13  CREATININE 0.91  CALCIUM 9.6   GFR: Estimated Creatinine Clearance: 57.3 mL/min (by C-G formula based on SCr of 0.91 mg/dL). Liver Function Tests: No results for input(s): AST, ALT, ALKPHOS, BILITOT, PROT, ALBUMIN in the last 168 hours. No results for input(s): LIPASE, AMYLASE in the last 168 hours. No results for input(s): AMMONIA in the last 168 hours. Coagulation Profile: No results for input(s): INR, PROTIME in the last 168 hours. Cardiac Enzymes: No results for input(s): CKTOTAL, CKMB, CKMBINDEX, TROPONINI in the last 168 hours. BNP (last 3 results) No results for input(s): PROBNP in the last 8760 hours. HbA1C: No results  for input(s): HGBA1C in the last 72 hours. CBG: No results for input(s): GLUCAP in the last 168 hours. Lipid Profile: No results for input(s): CHOL, HDL, LDLCALC, TRIG, CHOLHDL, LDLDIRECT in the last 72 hours. Thyroid Function Tests: No results for input(s): TSH, T4TOTAL, FREET4, T3FREE, THYROIDAB in the last 72 hours. Anemia Panel: No results for input(s): VITAMINB12, FOLATE, FERRITIN, TIBC, IRON, RETICCTPCT in the last 72 hours. Urine analysis:    Component Value Date/Time   COLORURINE STRAW (A) 12/15/2018 0446   APPEARANCEUR CLEAR (A) 12/15/2018 0446   LABSPEC 1.016 12/15/2018 0446   PHURINE 6.0 12/15/2018 0446   GLUCOSEU 150 (A) 12/15/2018 [id number]  HGBUR NEGATIVE 12/15/2018 0446   BILIRUBINUR NEGATIVE 12/15/2018 0446   KETONESUR NEGATIVE 12/15/2018 0446   PROTEINUR 100 (A) 12/15/2018 0446   NITRITE NEGATIVE 12/15/2018 0446   LEUKOCYTESUR NEGATIVE 12/15/2018 0446    Radiological Exams on Admission: No results found.  EKG: Independently reviewed.  Assessment/Plan   Recurrent Acute upper GI bleeding/melena in patient with known PUD and on chronic anticoagulation --Patient hemodynamically stable at this time --continue transfusion of two units PRBC initiated in the ER  --GI consult to evaluate for EGD in am --Continue protonix infusion --NPO for possible EGD in the am --second hospitalization for same in 6 months. Need to weigh pros/cons of continued systemic antiagulation. Patient received 4 units   symptomatic Anemia associated with acute blood loss --Hb of 6 --transfuse as above --continued management  As above     Non-insulin dependent type 2 diabetes mellitus (HCC) --hold home oral hypoglycemic agents patient will be n.p.o. --supplemental insulin coverage    PAD (peripheral artery disease) (HCC)s/p BKA --Holding all meds         DVT prophylaxis: SCD left leg Code Status: full  Disposition Plan: Back to previous home environment Consults called:  GI Admission status:inp    Andris Baumann MD [facility]   If 7PM-7AM, please contact night-coverage [email or web address] Password [email or web address]  04/13/2019, 1:04 AM

## 2019-04-13 NOTE — OR Nursing (Signed)
Pt. Care turned over to [employer]. Pt. Awake and in NAD with VSS.

## 2019-04-13 NOTE — Anesthesia Post-op Follow-up Note (Signed)
Anesthesia QCDR form completed.        

## 2019-04-13 NOTE — ED Notes (Signed)
ED TO INPATIENT HANDOFF REPORT  ED Nurse Name and Phone #: Cala BradfordKimberly [phone]  S Name/Age/Gender Sylvia [name] 63 y.o. female Room/Bed: ED08A/ED08A  Code Status   Code Status: Full Code  Home/SNF/Other Home Patient oriented to: self, place, time and situation Is this baseline? Yes   Triage Complete: Triage complete  Chief Complaint Upper GI bleed [K92.2]  Triage Note Patient to the ER from home via [facility] for c/o dizziness. Patient has h/o vertigo, but states symptoms have not gone away. Patient states she feels like room is moving and she is moving. Patient also c/o black stools.  EMS vitals: 115/51, HR 103, 20R, 100% on RA. Patient on Eliquis.     Allergies Allergies  Allergen Reactions  . Penicillins Hives    Has patient had a PCN reaction causing immediate rash, facial/tongue/throat swelling, SOB or lightheadedness with hypotension: Yes Has patient had a PCN reaction causing severe rash involving mucus membranes or skin necrosis: No Has patient had a PCN reaction that required hospitalization: No Has patient had a PCN reaction occurring within the last 10 years: No If all of the above answers are "NO", then may proceed with Cephalosporin use.  . Tramadol Itching    Level of Care/Admitting Diagnosis ED Disposition    ED Disposition Condition Comment   Admit  Hospital [facility] [[id number]]  Level of Care: Med-Surg [16]  Covid Evaluation: Asymptomatic Screening Protocol (No Symptoms)  Diagnosis: Upper GI bleed [267195]  Admitting Physician: Andris BaumannDUNCAN, HAZEL V [[id number]]  Attending Physician: Andris BaumannUNCAN, HAZEL V [[id number]]  PT Class (Do Not Modify): Observation [104]  PT Acc Code (Do Not Modify): Observation [10022]       B Medical/Surgery History Past Medical History:  Diagnosis Date  . Anxiety    h/o  . COPD (chronic obstructive pulmonary disease) (HCC)   . Diabetes mellitus without complication (HCC)   . GERD (gastroesophageal  reflux disease)   . Hypertension    bp under control-off meds since 2019   Past Surgical History:  Procedure Laterality Date  . ABDOMINAL HYSTERECTOMY    . AMPUTATION Right 12/27/2018   Procedure: AMPUTATION BELOW KNEE;  Surgeon: Annice Needyew, Jason S, MD;  Location: [facility] ORS;  Service: General;  Laterality: Right;  . CHOLECYSTECTOMY    . ESOPHAGOGASTRODUODENOSCOPY (EGD) WITH PROPOFOL N/A 06/15/2018   Procedure: ESOPHAGOGASTRODUODENOSCOPY (EGD) WITH PROPOFOL;  Surgeon: Toledo, Boykin Nearingeodoro K, MD;  Location: [facility] ENDOSCOPY;  Service: Gastroenterology;  Laterality: N/A;  . ESOPHAGOGASTRODUODENOSCOPY (EGD) WITH PROPOFOL N/A 01/03/2019   Procedure: ESOPHAGOGASTRODUODENOSCOPY (EGD) WITH PROPOFOL;  Surgeon: Midge MiniumWohl, Darren, MD;  Location: [facility] ENDOSCOPY;  Service: Endoscopy;  Laterality: N/A;  . LOWER EXTREMITY ANGIOGRAPHY Right 08/21/2018   Procedure: LOWER EXTREMITY ANGIOGRAPHY;  Surgeon: Annice Needyew, Jason S, MD;  Location: [facility] INV[facility]IVE CV LAB;  Service: Cardiovascular;  Laterality: Right;  . LOWER EXTREMITY ANGIOGRAPHY Left 08/28/2018   Procedure: LOWER EXTREMITY ANGIOGRAPHY;  Surgeon: Annice Needyew, Jason S, MD;  Location: [facility] CV [facility];  Service: Cardiovascular;  Laterality: Left;  . LOWER EXTREMITY ANGIOGRAPHY Right 08/28/2018   Procedure: Lower Extremity Angiography;  Surgeon: Annice Needyew, Jason S, MD;  Location: [facility];  Service: Cardiovascular;  Laterality: Right;  . LOWER EXTREMITY ANGIOGRAPHY Right 12/18/2018   Procedure: Lower Extremity Angiography;  Surgeon: Annice Needyew, Jason S, MD;  Location: [facility];  Service: Cardiovascular;  Laterality: Right;  . LOWER EXTREMITY ANGIOGRAPHY Right 12/18/2018   Procedure: Lower Extremity Angiography;  Surgeon: Annice Needyew, Jason S, MD;  Location: [facility];  Service: Cardiovascular;  Laterality: Right;  . LOWER EXTREMITY ANGIOGRAPHY Left 12/21/2018   Procedure: Lower Extremity Angiography;  Surgeon: Algernon Huxley, MD;  Location: [place] CV LAB;  Service:  Cardiovascular;  Laterality: Left;  . LOWER EXTREMITY ANGIOGRAPHY Right 12/21/2018   Procedure: Lower Extremity Angiography;  Surgeon: Algernon Huxley, MD;  Location: [place] CV LAB;  Service: Cardiovascular;  Laterality: Right;  . LOWER EXTREMITY INTERVENTION N/A 12/22/2018   Procedure: LOWER EXTREMITY INTERVENTION;  Surgeon: Algernon Huxley, MD;  Location: [place] CV LAB;  Service: Cardiovascular;  Laterality: N/A;     A IV Location/Drains/Wounds Patient Lines/Drains/Airways Status   Active Line/Drains/Airways    Name:   Placement date:   Placement time:   Site:   Days:   Peripheral IV 04/13/19 Right Antecubital   04/13/19    0032    Antecubital   less than 1   Incision (Closed) 12/27/18 Leg Right   12/27/18    1437     107          Intake/Output Last 24 hours  Intake/Output Summary (Last 24 hours) at 04/13/2019 0125 Last data filed at 04/13/2019 0116 Gross per 24 hour  Intake 340 ml  Output --  Net 340 ml    Labs/Imaging Results for orders placed or performed during the hospital encounter of 04/12/19 (from the past 48 hour(s))  Basic metabolic panel     Status: Abnormal   Collection Time: 04/12/19  9:09 PM  Result Value Ref Range   Sodium 140 135 - 145 mmol/L   Potassium 3.8 3.5 - 5.1 mmol/L   Chloride 107 98 - 111 mmol/L   CO2 24 22 - 32 mmol/L   Glucose, Bld 263 (H) 70 - 99 mg/dL   BUN 13 8 - 23 mg/dL   Creatinine, Ser 0.91 0.44 - 1.00 mg/dL   Calcium 9.6 8.9 - 10.3 mg/dL   GFR calc non Af Amer >60 >60 mL/min   GFR calc Af Amer >60 >60 mL/min   Anion gap 9 5 - 15    Comment: Performed at [facility], [address] [place], [place] [address]  CBC     Status: Abnormal   Collection Time: 04/12/19  9:09 PM  Result Value Ref Range   WBC 10.3 4.0 - 10.5 K/uL   RBC 1.97 (L) 3.87 - 5.11 MIL/uL   Hemoglobin 6.0 (L) 12.0 - 15.0 g/dL   HCT 18.1 (L) 36.0 - 46.0 %   MCV 91.9 80.0 - 100.0 fL   MCH 30.5 26.0 - 34.0 pg   MCHC 33.1 30.0 - 36.0 g/dL   RDW  16.1 (H) 11.5 - 15.5 %   Platelets 329 150 - 400 K/uL   nRBC 0.0 0.0 - 0.2 %    Comment: Performed at [facility], [address] [place], [place] [address]  Troponin I (High Sensitivity)     Status: None   Collection Time: 04/12/19  9:09 PM  Result Value Ref Range   Troponin I (High Sensitivity) 5 <18 ng/L    Comment: (NOTE) Elevated high sensitivity troponin I (hsTnI) values and significant  changes across serial measurements may suggest ACS but many other  chronic and acute conditions are known to elevate hsTnI results.  Refer to the "Links" section for chest pain algorithms and additional  guidance. Performed at [facility], [address] [place], [place] [address]   Type and screen [facility]  Status: None (Preliminary result)   Collection Time: 04/12/19 11:11 PM  Result Value Ref Range   ABO/RH(D) O POS    Antibody Screen NEG    Sample Expiration 04/15/2019,2359    Unit Number [id number]    Blood Component Type RBC LR PHER1    Unit division 00    Status of Unit ALLOCATED    Transfusion Status OK TO TRANSFUSE    Crossmatch Result Compatible    Unit Number [id number]    Blood Component Type RED CELLS,LR    Unit division 00    Status of Unit ISSUED    Transfusion Status OK TO TRANSFUSE    Crossmatch Result      Compatible Performed at [facility], [address], [place], [place] [address]   Respiratory Panel by RT PCR (Flu A&B, Covid) - Nasopharyngeal Swab     Status: None   Collection Time: 04/12/19 11:11 PM   Specimen: Nasopharyngeal Swab  Result Value Ref Range   SARS Coronavirus 2 by RT PCR NEGATIVE NEGATIVE    Comment: (NOTE) SARS-CoV-2 target nucleic acids are NOT DETECTED. The SARS-CoV-2 RNA is generally detectable in upper respiratoy specimens during the acute phase of infection. The lowest concentration of SARS-CoV-2 viral copies this assay can detect is 131 copies/mL. A negative result  does not preclude SARS-Cov-2 infection and should not be used as the sole basis for treatment or other patient management decisions. A negative result may occur with  improper specimen collection/handling, submission of specimen other than nasopharyngeal swab, presence of viral mutation(s) within the areas targeted by this assay, and inadequate number of viral copies (<131 copies/mL). A negative result must be combined with clinical observations, patient history, and epidemiological information. The expected result is Negative. Fact Sheet for Patients:  https://www.moore.com/ Fact Sheet for Healthcare Providers:  https://www.young.biz/ This test is not yet ap proved or cleared by the [place] FDA and  has been authorized for detection and/or diagnosis of SARS-CoV-2 by FDA under an Emergency Use Authorization (EUA). This EUA will remain  in effect (meaning this test can be used) for the duration of the COVID-19 declaration under Section 564(b)(1) of the Act, 21 U.S.C. section 360bbb-3(b)(1), unless the authorization is terminated or revoked sooner.    Influenza A by PCR NEGATIVE NEGATIVE   Influenza B by PCR NEGATIVE NEGATIVE    Comment: (NOTE) The Xpert Xpress SARS-CoV-2/FLU/RSV assay is intended as an aid in  the diagnosis of influenza from Nasopharyngeal swab specimens and  should not be used as a sole basis for treatment. Nasal washings and  aspirates are unacceptable for Xpert Xpress SARS-CoV-2/FLU/RSV  testing. Fact Sheet for Patients: https://www.moore.com/ Fact Sheet for Healthcare Providers: https://www.young.biz/ This test is not yet approved or cleared by the [place] FDA and  has been authorized for detection and/or diagnosis of SARS-CoV-2 by  FDA under an Emergency Use Authorization (EUA). This EUA will remain  in effect (meaning this test can be used) for the duration of the   Covid-19 declaration under Section 564(b)(1) of the Act, 21  U.S.C. section 360bbb-3(b)(1), unless the authorization is  terminated or revoked. Performed at [facility], [address] [place], [place] [address]   Prepare RBC     Status: None   Collection Time: 04/12/19 11:11 PM  Result Value Ref Range   Order Confirmation      ORDER PROCESSED BY BLOOD BANK Performed at [facility], [address] [place] [place], [place] [address]  No results found.  Pending Labs Unresulted Labs (From admission, onward)    Start     Ordered   04/12/19 2104  Urinalysis, Complete w Microscopic  ONCE - STAT,   STAT     04/12/19 2104          Vitals/Pain Today's Vitals   04/13/19 0121 04/13/19 0122 04/13/19 0123 04/13/19 0124  BP:      Pulse: 85 78 75 81  Resp: 13 13 17  (!) 23  Temp:      TempSrc:      SpO2: 100% 100% 100% 100%  Weight:      Height:      PainSc:        Isolation Precautions No active isolations  Medications Medications  0.9 %  sodium chloride infusion (has no administration in time range)  pantoprazole (PROTONIX) 80 mg in sodium chloride 0.9 % 100 mL IVPB (has no administration in time range)  pantoprazole (PROTONIX) 80 mg in sodium chloride 0.9 % 250 mL (0.32 mg/mL) infusion (has no administration in time range)  0.9 %  sodium chloride infusion (Manually program via Guardrails IV Fluids) (has no administration in time range)  0.9 %  sodium chloride infusion (has no administration in time range)  ondansetron (ZOFRAN) tablet 4 mg (has no administration in time range)    Or  ondansetron (ZOFRAN) injection 4 mg (has no administration in time range)    Mobility walks with device Low fall risk   Focused Assessments Blood admin   R Recommendations: See Admitting Provider Note  Report given to:   Additional Notes:  Blood running at 191ml/hour currently. Just started @0116

## 2019-04-13 NOTE — Op Note (Signed)
[facility] Gastroenterology Patient Name: Sylvia [name] Procedure Date: 04/13/2019 11:59 AM MRN: [id number] Account #: [id number] Date of Birth: 07-25-1955 Admit Type: Outpatient Age: 63 Room: [facility] ENDO ROOM 4 Gender: Female Note Status: Finalized Procedure:             Upper GI endoscopy Indications:           Iron deficiency anemia Providers:             Midge Minium MD, MD Medicines:             General Anesthesia Complications:         No immediate complications. Procedure:             Pre-Anesthesia Assessment:                        - Prior to the procedure, a History and Physical was                         performed, and patient medications and allergies were                         reviewed. The patient's tolerance of previous                         anesthesia was also reviewed. The risks and benefits                         of the procedure and the sedation options and risks                         were discussed with the patient. All questions were                         answered, and informed consent was obtained. Prior                         Anticoagulants: The patient has taken no previous                         anticoagulant or antiplatelet agents. ASA Grade                         Assessment: II - A patient with mild systemic disease.                         After reviewing the risks and benefits, the patient                         was deemed in satisfactory condition to undergo the                         procedure.                        After obtaining informed consent, the endoscope was                         passed under direct vision. Throughout the procedure,  the patient's blood pressure, pulse, and oxygen                         saturations were monitored continuously. The Endoscope                         was introduced through the mouth, and advanced to the                         second part of  duodenum. The upper GI endoscopy was                         accomplished without difficulty. The patient tolerated                         the procedure well. Findings:      The examined esophagus was normal.      The entire examined stomach was normal.      The examined duodenum was normal. Impression:            - Normal esophagus.                        - Normal stomach.                        - Normal examined duodenum.                        - No specimens collected. Recommendation:        - Return patient to hospital ward for ongoing care.                        - Clear liquid diet.                        - Continue present medications.                        - Colonoscopy after 3 days off anticoagulation. Likely                         monday. Procedure Code(s):     --- Professional ---                        [phone], Esophagogastroduodenoscopy, flexible,                         transoral; diagnostic, including collection of                         specimen(s) by brushing or washing, when performed                         (separate procedure) Diagnosis Code(s):     --- Professional ---                        D50.9, Iron deficiency anemia, unspecified CPT copyright 2019 American Medical Association. All rights reserved. The codes documented in this report are preliminary and upon coder review may  be revised to meet  current compliance requirements. Lucilla Lame MD, MD 04/13/2019 12:32:50 PM This report has been signed electronically. Number of Addenda: 0 Note Initiated On: 04/13/2019 11:59 AM Estimated Blood Loss:  Estimated blood loss: none.      [facility]

## 2019-04-13 NOTE — ED Notes (Signed)
Patient NPO for possible scope.

## 2019-04-13 NOTE — Progress Notes (Signed)
Pt arrived to dry heaving and vomiting up yellow-green bile. Zofran given @1230  per MAR.

## 2019-04-13 NOTE — ED Notes (Signed)
Patient moved to hospital bed for comfort. Patient to procedure with transport. Has cont NPO.

## 2019-04-13 NOTE — ED Notes (Signed)
Transfusion PRBC completed, no signs or symptoms or reaction.

## 2019-04-13 NOTE — Anesthesia Preprocedure Evaluation (Signed)
Anesthesia Evaluation  Patient identified by MRN, date of birth, ID band Patient awake    Reviewed: Allergy & Precautions, NPO status , Patient's Chart, lab work & pertinent test results  History of Anesthesia Complications Negative for: history of anesthetic complications  Airway Mallampati: II       Dental  (+) Edentulous Upper, Edentulous Lower   Pulmonary neg sleep apnea, COPD, Current Smoker, former smoker,           Cardiovascular hypertension, Pt. on medications + Peripheral Vascular Disease  (-) Past MI and (-) CHF (-) dysrhythmias (-) Valvular Problems/Murmurs     Neuro/Psych neg Seizures Anxiety    GI/Hepatic Neg liver ROS, PUD, GERD  Medicated,  Endo/Other  diabetes, Type 2, Oral Hypoglycemic Agents  Renal/GU negative Renal ROS     Musculoskeletal   Abdominal   Peds  Hematology   Anesthesia Other Findings   Reproductive/Obstetrics                             Anesthesia Physical Anesthesia Plan  ASA: III and emergent  Anesthesia Plan: General   Post-op Pain Management:    Induction: Intravenous  PONV Risk Score and Plan: Propofol infusion, TIVA and Treatment may vary due to age or medical condition  Airway Management Planned:   Additional Equipment:   Intra-op Plan:   Post-operative Plan:   Informed Consent: I have reviewed the patients History and Physical, chart, labs and discussed the procedure including the risks, benefits and alternatives for the proposed anesthesia with the patient or authorized representative who has indicated his/her understanding and acceptance.       Plan Discussed with:   Anesthesia Plan Comments:         Anesthesia Quick Evaluation

## 2019-04-13 NOTE — Progress Notes (Signed)
Pt  Still c/o N/V. Still unable to eat. Calling out for help. Paged MD Manuella Ghazi to make aware. Order for 25mg  phenergan Q6h PRN. Medication given. Will continue to monitor.

## 2019-04-13 NOTE — Anesthesia Postprocedure Evaluation (Signed)
Anesthesia Post Note  Patient: Sylvia [name]  Procedure(s) Performed: ESOPHAGOGASTRODUODENOSCOPY (EGD) (N/A )  Patient location during evaluation: PACU Anesthesia Type: General Level of consciousness: awake and alert Pain management: pain level controlled Vital Signs Assessment: post-procedure vital signs reviewed and stable Respiratory status: spontaneous breathing and respiratory function stable Cardiovascular status: stable Anesthetic complications: no     Last Vitals:  Vitals:   04/13/19 1131 04/13/19 1247  BP: (!) 151/77 124/72  Pulse: 72 88  Resp: 19 18  Temp: 36.4 C (!) 36.3 C  SpO2: 100% 100%    Last Pain:  Vitals:   04/13/19 1247  TempSrc: Tympanic  PainSc:                  Lakela Kuba K

## 2019-04-13 NOTE — OR Nursing (Signed)
Pt. Departed unit for EGD procedure. Pt. Awake and alert in NAD with VSS.

## 2019-04-13 NOTE — Progress Notes (Addendum)
1        [place] [facility] [place] NAME: Sylvia [name]    MR#:  [id number]  DATE OF BIRTH:  22-Nov-1955  SUBJECTIVE:  CHIEF COMPLAINT:   Chief Complaint  Patient presents with  . Dizziness  No further bleeding.  Hemodynamically stable.  Waiting for endoscopy REVIEW OF SYSTEMS:  Review of Systems  Constitutional: Negative for diaphoresis, fever, malaise/fatigue and weight loss.  HENT: Negative for ear discharge, ear pain, hearing loss, nosebleeds, sore throat and tinnitus.   Eyes: Negative for blurred vision and pain.  Respiratory: Negative for cough, hemoptysis, shortness of breath and wheezing.   Cardiovascular: Negative for chest pain, palpitations, orthopnea and leg swelling.  Gastrointestinal: Positive for blood in stool. Negative for abdominal pain, constipation, diarrhea, heartburn, nausea and vomiting.  Genitourinary: Negative for dysuria, frequency and urgency.  Musculoskeletal: Negative for back pain and myalgias.  Skin: Negative for itching and rash.  Neurological: Negative for dizziness, tingling, tremors, focal weakness, seizures, weakness and headaches.  Psychiatric/Behavioral: Negative for depression. The patient is not nervous/anxious.     DRUG ALLERGIES:   Allergies  Allergen Reactions  . Penicillins Hives    Has patient had a PCN reaction causing immediate rash, facial/tongue/throat swelling, SOB or lightheadedness with hypotension: Yes Has patient had a PCN reaction causing severe rash involving mucus membranes or skin necrosis: No Has patient had a PCN reaction that required hospitalization: No Has patient had a PCN reaction occurring within the last 10 years: No If all of the above answers are "NO", then may proceed with Cephalosporin use.  . Tramadol Itching   VITALS:  Blood pressure (!) 151/77, pulse 72, temperature 97.6 F (36.4 C), resp. rate 19, height 5\' 7"  (1.702 m), weight 58.1 kg, SpO2 100 %. PHYSICAL EXAMINATION:   Physical Exam HENT:     Head: Normocephalic and atraumatic.  Eyes:     Conjunctiva/sclera: Conjunctivae normal.     Pupils: Pupils are equal, round, and reactive to light.  Neck:     Thyroid: No thyromegaly.     Trachea: No tracheal deviation.  Cardiovascular:     Rate and Rhythm: Normal rate and regular rhythm.     Heart sounds: Normal heart sounds.  Pulmonary:     Effort: Pulmonary effort is normal. No respiratory distress.     Breath sounds: Normal breath sounds. No wheezing.  Chest:     Chest wall: No tenderness.  Abdominal:     General: Bowel sounds are normal. There is no distension.     Palpations: Abdomen is soft.     Tenderness: There is no abdominal tenderness.  Musculoskeletal:        General: Normal range of motion.     Cervical back: Normal range of motion and neck supple.  Skin:    General: Skin is warm and dry.     Findings: No rash.  Neurological:     Mental Status: She is alert and oriented to person, place, and time.     Cranial Nerves: No cranial nerve deficit.    LABORATORY PANEL:  Female CBC Recent Labs  Lab 04/13/19 0900  WBC 8.7  HGB 8.8*  HCT 25.9*  PLT 270   ------------------------------------------------------------------------------------------------------------------ Chemistries  Recent Labs  Lab 04/12/19 2109  NA 140  K 3.8  CL 107  CO2 24  GLUCOSE 263*  BUN 13  CREATININE 0.91  CALCIUM 9.6   RADIOLOGY:  No results found. ASSESSMENT AND PLAN:  Sylvia [name] is  a 63 y.o. female on chronic anticoagulation with Eliquis due to severe peripheral vascular disease, with history of right BKA, COPD, diabetes, hypertension, and peptic ulcer disease is being admitted for severe anemia.  She was admitted in September 2020 with acute upper GI bleed requiring 4 units of packed red blood cells with gastric ulcer seen on upper endoscopy.  She reports dizziness and 1 week history of intermittent stool most recently the day prior to arrival  .On arrival in the emergency room blood pressure initially 149/70 with a heart rate of 89 .hemoglobin was 6.0.    Recurrent Acute GI bleeding/melena in patient with known PUD and on chronic anticoagulation --Patient hemodynamically stable at this time --Hemoglobin 8.8 status post 2 units of packed red blood cell transfusion in the ED --Status post EGD showing no signs of bleeding.  GI recommends colonoscopy 3 days after being off anticoagulation possibly Monday --Continue protonix  --Clear liquid diet --second hospitalization for same in 6 months. Need to weigh pros/cons of continued systemic antiagulation.    symptomatic Anemia associated with acute blood loss --Hb 8.8 status post 2 units of packed red blood cell transfusion in the ED --No signs of bleeding seen on EGD   Non-insulin dependent type 2 diabetes mellitus (HCC) --hold home oral hypoglycemic agents patient will be n.p.o. --supplemental insulin coverage    PAD (peripheral artery disease) (HCC)s/p BKA --Holding all meds      All the records are reviewed and case discussed with Care Management/Social Worker. Management plans discussed with the patient, GI, nursing and they are in agreement.  CODE STATUS: Full Code  TOTAL TIME TAKING CARE OF THIS PATIENT: 35 minutes.   More than 50% of the time was spent in counseling/coordination of care: YES  POSSIBLE D/C IN 3-4 DAYS, DEPENDING ON CLINICAL CONDITION.  And GI evaluation   Delfino Lovett M.D on 04/13/2019 at 12:36 PM  Between 7am to 6pm - Pager - [phone]  After 6pm go to [email or web address] - password [email or web address]  [facility]   CC: Primary care physician; Center, [facility]  Note: This dictation was prepared with Nurse, children's dictation along with smaller Lobbyist. Any transcriptional errors that result from this process are unintentional.

## 2019-04-13 NOTE — ED Notes (Signed)
Attempted 22g IV at lateral L hand without success.

## 2019-04-13 NOTE — Anesthesia Procedure Notes (Signed)
Procedure Name: Intubation Date/Time: 04/13/2019 12:19 PM Performed by: Johnna Acosta, CRNA Pre-anesthesia Checklist: Patient identified, Emergency Drugs available, Suction available, Patient being monitored and Timeout performed Patient Re-evaluated:Patient Re-evaluated prior to induction Oxygen Delivery Method: Circle system utilized Preoxygenation: Pre-oxygenation with 100% oxygen Induction Type: IV induction, Rapid sequence and Cricoid Pressure applied Laryngoscope Size: McGraph and 3 Grade View: Grade II Tube type: Oral Tube size: 7.0 mm Number of attempts: 1 Airway Equipment and Method: Stylet and Video-laryngoscopy Placement Confirmation: ETT inserted through vocal cords under direct vision,  positive ETCO2 and breath sounds checked- equal and bilateral Secured at: 21 cm Tube secured with: Tape Dental Injury: Teeth and Oropharynx as per pre-operative assessment

## 2019-04-13 NOTE — Transfer of Care (Signed)
Immediate Anesthesia Transfer of Care Note  Patient: Sylvia [name]  Procedure(s) Performed: ESOPHAGOGASTRODUODENOSCOPY (EGD) (N/A )  Patient Location: PACU  Anesthesia Type:General  Level of Consciousness: awake, alert  and oriented  Airway & Oxygen Therapy: Patient Spontanous Breathing and Patient connected to nasal cannula oxygen  Post-op Assessment: Report given to RN and Post -op Vital signs reviewed and stable  Post vital signs: Reviewed and stable  Last Vitals:  Vitals Value Taken Time  BP 124/72 04/13/19 1247  Temp 36.3 C 04/13/19 1247  Pulse 88 04/13/19 1247  Resp 18 04/13/19 1247  SpO2 100 % 04/13/19 1247    Last Pain:  Vitals:   04/13/19 1247  TempSrc: Tympanic  PainSc:          Complications: No apparent anesthesia complications

## 2019-04-14 LAB — BASIC METABOLIC PANEL
Anion gap: 12 (ref 5–15)
BUN: 12 mg/dL (ref 8–23)
CO2: 19 mmol/L — ABNORMAL LOW (ref 22–32)
Calcium: 9.6 mg/dL (ref 8.9–10.3)
Chloride: 112 mmol/L — ABNORMAL HIGH (ref 98–111)
Creatinine, Ser: 0.68 mg/dL (ref 0.44–1.00)
GFR calc Af Amer: >60 mL/min (ref >60)
GFR calc non Af Amer: >60 mL/min (ref >60)
Glucose, Bld: 248 mg/dL — ABNORMAL HIGH (ref 70–99)
Potassium: 3.8 mmol/L (ref 3.5–5.1)
Sodium: 143 mmol/L (ref 135–145)

## 2019-04-14 LAB — TYPE AND SCREEN
ABO/RH(D): O POS
Antibody Screen: NEGATIVE
Unit division: 0
Unit division: 0

## 2019-04-14 LAB — CBC
HCT: 25.8 % — ABNORMAL LOW (ref 36.0–46.0)
Hemoglobin: 9.4 g/dL — ABNORMAL LOW (ref 12.0–15.0)
MCH: 29.3 pg (ref 26.0–34.0)
MCHC: 36.4 g/dL — ABNORMAL HIGH (ref 30.0–36.0)
MCV: 80.4 fL (ref 80.0–100.0)
Platelets: 306 10*3/uL (ref 150–400)
RBC: 3.21 MIL/uL — ABNORMAL LOW (ref 3.87–5.11)
RDW: 15.1 % (ref 11.5–15.5)
WBC: 12.7 10*3/uL — ABNORMAL HIGH (ref 4.0–10.5)
nRBC: 0 % (ref 0.0–0.2)

## 2019-04-14 LAB — URINALYSIS, COMPLETE (UACMP) WITH MICROSCOPIC
Bacteria, UA: NONE SEEN
Bilirubin Urine: NEGATIVE
Glucose, UA: >=500 mg/dL — AB
Hgb urine dipstick: NEGATIVE
Ketones, ur: NEGATIVE mg/dL
Leukocytes,Ua: NEGATIVE
Nitrite: NEGATIVE
Protein, ur: 30 mg/dL — AB
Specific Gravity, Urine: 1.007 (ref 1.005–1.030)
pH: 6 (ref 5.0–8.0)

## 2019-04-14 LAB — BPAM RBC
Blood Product Expiration Date: 202101232359
Blood Product Expiration Date: 202101232359
ISSUE DATE / TIME: 202012250059
ISSUE DATE / TIME: 202012250450
Unit Type and Rh: 5100
Unit Type and Rh: 5100

## 2019-04-14 LAB — HEMOGLOBIN A1C
Hgb A1c MFr Bld: 5.7 % — ABNORMAL HIGH (ref 4.8–5.6)
Mean Plasma Glucose: 116.89 mg/dL

## 2019-04-14 LAB — GLUCOSE, CAPILLARY: Glucose-Capillary: 212 mg/dL — ABNORMAL HIGH (ref 70–99)

## 2019-04-14 MED ORDER — INSULIN ASPART 100 UNIT/ML ~~LOC~~ SOLN: 2.0000 [IU] | Freq: Three times a day (TID) | SUBCUTANEOUS | Status: DC

## 2019-04-14 MED ORDER — CHLORHEXIDINE GLUCONATE 0.12 % MT SOLN
15.0000 mL | Freq: Two times a day (BID) | OROMUCOSAL | Status: DC
  Administered 2019-04-14 – 2019-04-17 (×6): 15 mL via OROMUCOSAL
  Filled 2019-04-14 (×6): qty 15

## 2019-04-14 MED ORDER — INSULIN ASPART 100 UNIT/ML ~~LOC~~ SOLN
0.0000 [IU] | Freq: Three times a day (TID) | SUBCUTANEOUS | Status: DC
  Administered 2019-04-14: 3 [IU] via SUBCUTANEOUS
  Administered 2019-04-15: 2 [IU] via SUBCUTANEOUS
  Administered 2019-04-15: 1 [IU] via SUBCUTANEOUS
  Administered 2019-04-15: 2 [IU] via SUBCUTANEOUS
  Administered 2019-04-16: 5 [IU] via SUBCUTANEOUS
  Administered 2019-04-17: 1 [IU] via SUBCUTANEOUS
  Administered 2019-04-17: 3 [IU] via SUBCUTANEOUS
  Filled 2019-04-14 (×7): qty 1

## 2019-04-14 MED ORDER — INSULIN ASPART 100 UNIT/ML ~~LOC~~ SOLN: 0.0000 [IU] | Freq: Three times a day (TID) | SUBCUTANEOUS | Status: DC

## 2019-04-14 MED ORDER — INSULIN ASPART 100 UNIT/ML ~~LOC~~ SOLN: 0.0000 [IU] | Freq: Every day | SUBCUTANEOUS | Status: DC

## 2019-04-14 MED ORDER — METOPROLOL TARTRATE 50 MG PO TABS
50.0000 mg | ORAL_TABLET | Freq: Two times a day (BID) | ORAL | Status: DC
  Administered 2019-04-14 – 2019-04-17 (×7): 50 mg via ORAL
  Filled 2019-04-14 (×7): qty 1

## 2019-04-14 MED ORDER — ORAL CARE MOUTH RINSE
15.0000 mL | Freq: Two times a day (BID) | OROMUCOSAL | Status: DC
  Administered 2019-04-14 – 2019-04-17 (×5): 15 mL via OROMUCOSAL

## 2019-04-14 MED ORDER — PANTOPRAZOLE SODIUM 40 MG IV SOLR
40.0000 mg | Freq: Two times a day (BID) | INTRAVENOUS | Status: DC
  Administered 2019-04-14 – 2019-04-17 (×6): 40 mg via INTRAVENOUS
  Filled 2019-04-14 (×6): qty 40

## 2019-04-14 MED ORDER — INSULIN ASPART 100 UNIT/ML ~~LOC~~ SOLN
2.0000 [IU] | Freq: Three times a day (TID) | SUBCUTANEOUS | Status: DC
  Administered 2019-04-15 – 2019-04-17 (×5): 2 [IU] via SUBCUTANEOUS
  Filled 2019-04-14 (×6): qty 1

## 2019-04-14 NOTE — Progress Notes (Signed)
Sylvia Lame, MD [facility]   [address] [name], [place] [address] Phone: [phone] Fax : [phone]   Subjective: This patient has had continued nausea vomiting that started about 10 minutes before she was brought in for her upper endoscopy yesterday.  Due to her nausea prior to the procedure it was determined best to intubate her for the EGD for fear of aspiration.  The patient's EGD was normal.  She reports that she is feeling a little better today.  Her hemoglobin has been stable.  She is not tolerating even liquids.  She reports that she does not have a gallbladder.   Objective: Vital signs in last 24 hours: Vitals:   04/13/19 1419 04/13/19 1920 04/14/19 0416 04/14/19 0718  BP: (!) 170/69 (!) 155/60 (!) 166/65 (!) 186/77  Pulse: 76 90 91 96  Resp:  18 16   Temp: 97.8 F (36.6 C) 99 F (37.2 C) 99.7 F (37.6 C) 98.1 F (36.7 C)  TempSrc:  Oral Oral Oral  SpO2: 100% 100% 100% 100%  Weight:      Height:       Weight change:   Intake/Output Summary (Last 24 hours) at 04/14/2019 1328 Last data filed at 04/14/2019 1257 Gross per 24 hour  Intake 311.88 ml  Output 4300 ml  Net -3988.12 ml     Exam: Heart:: Regular rate and rhythm, S1S2 present or without murmur or extra heart sounds Lungs: normal and clear to auscultation and percussion Abdomen: soft, nontender, normal bowel sounds   Lab Results: @LABTEST2 @ Micro Results: Recent Results (from the past 240 hour(s))  Respiratory Panel by RT PCR (Flu A&B, Covid) - Nasopharyngeal Swab     Status: None   Collection Time: 04/12/19 11:11 PM   Specimen: Nasopharyngeal Swab  Result Value Ref Range Status   SARS Coronavirus 2 by RT PCR NEGATIVE NEGATIVE Final    Comment: (NOTE) SARS-CoV-2 target nucleic acids are NOT DETECTED. The SARS-CoV-2 RNA is generally detectable in upper respiratoy specimens during the acute phase of infection. The lowest concentration of SARS-CoV-2 viral copies this assay can detect  is 131 copies/mL. A negative result does not preclude SARS-Cov-2 infection and should not be used as the sole basis for treatment or other patient management decisions. A negative result may occur with  improper specimen collection/handling, submission of specimen other than nasopharyngeal swab, presence of viral mutation(s) within the areas targeted by this assay, and inadequate number of viral copies (<131 copies/mL). A negative result must be combined with clinical observations, patient history, and epidemiological information. The expected result is Negative. Fact Sheet for Patients:  PinkCheek.be Fact Sheet for Healthcare Providers:  GravelBags.it This test is not yet ap proved or cleared by the [place] FDA and  has been authorized for detection and/or diagnosis of SARS-CoV-2 by FDA under an Emergency Use Authorization (EUA). This EUA will remain  in effect (meaning this test can be used) for the duration of the COVID-19 declaration under Section 564(b)(1) of the Act, 21 U.S.C. section 360bbb-3(b)(1), unless the authorization is terminated or revoked sooner.    Influenza A by PCR NEGATIVE NEGATIVE Final   Influenza B by PCR NEGATIVE NEGATIVE Final    Comment: (NOTE) The Xpert Xpress SARS-CoV-2/FLU/RSV assay is intended as an aid in  the diagnosis of influenza from Nasopharyngeal swab specimens and  should not be used as a sole basis for treatment. Nasal washings and  aspirates are unacceptable for Xpert Xpress SARS-CoV-2/FLU/RSV  testing. Fact Sheet for Patients:  https://www.moore.com/ Fact Sheet for Healthcare Providers: https://www.young.biz/ This test is not yet approved or cleared by the [place] FDA and  has been authorized for detection and/or diagnosis of SARS-CoV-2 by  FDA under an Emergency Use Authorization (EUA). This EUA will remain  in effect (meaning this  test can be used) for the duration of the  Covid-19 declaration under Section 564(b)(1) of the Act, 21  U.S.C. section 360bbb-3(b)(1), unless the authorization is  terminated or revoked. Performed at [facility], [address] [place], [place] [address]    Studies/Results: No results found. Medications: I have reviewed the patient's current medications. Scheduled Meds: . sodium chloride   Intravenous Once  . chlorhexidine  15 mL Mouth Rinse BID  . mouth rinse  15 mL Mouth Rinse q12n4p   Continuous Infusions: . sodium chloride    . sodium chloride 75 mL/hr at 04/14/19 0616  . pantoprozole (PROTONIX) infusion 8 mg/hr (04/14/19 0617)   PRN Meds:.ondansetron **OR** ondansetron (ZOFRAN) IV, promethazine   Assessment: Principal Problem:   Acute upper GI bleeding Active Problems:   Non-insulin dependent type 2 diabetes mellitus (HCC)   PAD (peripheral artery disease) (HCC)   Anemia associated with acute blood loss   Chronic anticoagulation   Hx of right BKA (HCC)   Symptomatic anemia   Upper GI bleed   Acute GI bleeding    Plan: This patient came in with a GI bleed and started to have nausea few minutes prior to starting her EGD.  The patient's EGD was normal without any sign of old blood, fresh blood or ulcerations.  The patient nausea vomiting can be from a central cause versus an infection with the patient's white cell count being 12.7 this morning.  No cause for the patient's nausea vomiting was seen in the EGD.  The patient has been told that due to her anemia and never having a colonoscopy she should have a colonoscopy.  If the patient's nausea should resolve then she could be prepped tomorrow for a colonoscopy on Monday otherwise if she is feeling better and is able to tolerate p.o.'s then she may have this done as an outpatient.   LOS: 1 day   Midge Minium 04/14/2019, 1:28 PM Pager [phone] 7am-5pm  Check AMION for 5pm -7am coverage and on weekends

## 2019-04-14 NOTE — Progress Notes (Addendum)
1        [place] at [place] NAME: Sylvia [name]    MR#:  [id number]  DATE OF BIRTH:  1955/09/19  SUBJECTIVE:  CHIEF COMPLAINT:   Chief Complaint  Patient presents with  . Dizziness  No further bleeding.  Had some nausea yesterday after EGD but improved with Reglan, blood pressure high REVIEW OF SYSTEMS:  Review of Systems  Constitutional: Negative for diaphoresis, fever, malaise/fatigue and weight loss.  HENT: Negative for ear discharge, ear pain, hearing loss, nosebleeds, sore throat and tinnitus.   Eyes: Negative for blurred vision and pain.  Respiratory: Negative for cough, hemoptysis, shortness of breath and wheezing.   Cardiovascular: Negative for chest pain, palpitations, orthopnea and leg swelling.  Gastrointestinal: Positive for blood in stool. Negative for abdominal pain, constipation, diarrhea, heartburn, nausea and vomiting.  Genitourinary: Negative for dysuria, frequency and urgency.  Musculoskeletal: Negative for back pain and myalgias.  Skin: Negative for itching and rash.  Neurological: Negative for dizziness, tingling, tremors, focal weakness, seizures, weakness and headaches.  Psychiatric/Behavioral: Negative for depression. The patient is not nervous/anxious.    DRUG ALLERGIES:   Allergies  Allergen Reactions  . Penicillins Hives    Has patient had a PCN reaction causing immediate rash, facial/tongue/throat swelling, SOB or lightheadedness with hypotension: Yes Has patient had a PCN reaction causing severe rash involving mucus membranes or skin necrosis: No Has patient had a PCN reaction that required hospitalization: No Has patient had a PCN reaction occurring within the last 10 years: No If all of the above answers are "NO", then may proceed with Cephalosporin use.  . Tramadol Itching   VITALS:  Blood pressure (!) 186/77, pulse 96, temperature 98.1 F (36.7 C), temperature source Oral, resp. rate 16, height 5\' 7"  (1.702 m),  weight 58.1 kg, SpO2 100 %. PHYSICAL EXAMINATION:  Physical Exam HENT:     Head: Normocephalic and atraumatic.  Eyes:     Conjunctiva/sclera: Conjunctivae normal.     Pupils: Pupils are equal, round, and reactive to light.  Neck:     Thyroid: No thyromegaly.     Trachea: No tracheal deviation.  Cardiovascular:     Rate and Rhythm: Normal rate and regular rhythm.     Heart sounds: Normal heart sounds.  Pulmonary:     Effort: Pulmonary effort is normal. No respiratory distress.     Breath sounds: Normal breath sounds. No wheezing.  Chest:     Chest wall: No tenderness.  Abdominal:     General: Bowel sounds are normal. There is no distension.     Palpations: Abdomen is soft.     Tenderness: There is no abdominal tenderness.  Musculoskeletal:        General: Normal range of motion.     Cervical back: Normal range of motion and neck supple.  Skin:    General: Skin is warm and dry.     Findings: No rash.  Neurological:     Mental Status: She is alert and oriented to person, place, and time.     Cranial Nerves: No cranial nerve deficit.    LABORATORY PANEL:  Female CBC Recent Labs  Lab 04/14/19 0356  WBC 12.7*  HGB 9.4*  HCT 25.8*  PLT 306   ------------------------------------------------------------------------------------------------------------------ Chemistries  Recent Labs  Lab 04/14/19 0356  NA 143  K 3.8  CL 112*  CO2 19*  GLUCOSE 248*  BUN 12  CREATININE 0.68  CALCIUM 9.6   RADIOLOGY:  No  results found. ASSESSMENT AND PLAN:  Sylvia [name] is a 63 y.o. female on chronic anticoagulation with Eliquis due to severe peripheral vascular disease, with history of right BKA, COPD, diabetes, hypertension, and peptic ulcer disease is being admitted for severe anemia.  She was admitted in September 2020 with acute upper GI bleed requiring 4 units of packed red blood cells with gastric ulcer seen on upper endoscopy.  She reports dizziness and 1 week history of  intermittent stool most recently the day prior to arrival .On arrival in the emergency room blood pressure initially 149/70 with a heart rate of 89 .hemoglobin was 6.0.    Recurrent Acute GI bleeding/melena in patient with known PUD and on chronic anticoagulation --Patient hemodynamically stable at this time --Hemoglobin 9.4 status post 2 units of packed red blood cell transfusion  --Status post EGD showing no signs of bleeding.  GI might consider colonoscopy on Monday if she is able to tolerate prep --Continue protonix  --Clear liquid diet --second hospitalization for same in 6 months. Need to weigh pros/cons of continued systemic antiagulation.    symptomatic Anemia associated with acute blood loss --Hb 9.4 this morning status post 2 units of packed red blood cell transfusion --No signs of bleeding seen on EGD   Non-insulin dependent type 2 diabetes mellitus (HCC) --hold home oral hypoglycemic agents patient will be n.p.o. --supplemental insulin coverage    PAD (peripheral artery disease) (HCC)s/p BKA --Holding all meds   Uncontrolled hypertension We will restart metoprolol 50 mg twice daily    All the records are reviewed and case discussed with Care Management/Social Worker. Management plans discussed with the patient, nursing and they are in agreement.  CODE STATUS: Full Code  TOTAL TIME TAKING CARE OF THIS PATIENT: 35 minutes.   More than 50% of the time was spent in counseling/coordination of care: YES  POSSIBLE D/C IN 2-3 DAYS, DEPENDING ON CLINICAL CONDITION.  And GI evaluation   Delfino Lovett M.D on 04/14/2019 at 2:02 PM  Between 7am to 6pm - Pager - [phone]  After 6pm go to [email or web address] - password [email or web address]  [facility]   CC: Primary care physician; Center, [facility]  Note: This dictation was prepared with Nurse, children's dictation along with smaller Lobbyist. Any transcriptional errors that result from this process are  unintentional.

## 2019-04-15 LAB — CBC
HCT: 25.9 % — ABNORMAL LOW (ref 36.0–46.0)
Hemoglobin: 9.2 g/dL — ABNORMAL LOW (ref 12.0–15.0)
MCH: 29.6 pg (ref 26.0–34.0)
MCHC: 35.5 g/dL (ref 30.0–36.0)
MCV: 83.3 fL (ref 80.0–100.0)
Platelets: 284 10*3/uL (ref 150–400)
RBC: 3.11 MIL/uL — ABNORMAL LOW (ref 3.87–5.11)
RDW: 15.4 % (ref 11.5–15.5)
WBC: 12.1 10*3/uL — ABNORMAL HIGH (ref 4.0–10.5)
nRBC: 0 % (ref 0.0–0.2)

## 2019-04-15 LAB — BASIC METABOLIC PANEL
Anion gap: 9 (ref 5–15)
BUN: 10 mg/dL (ref 8–23)
CO2: 23 mmol/L (ref 22–32)
Calcium: 9.1 mg/dL (ref 8.9–10.3)
Chloride: 110 mmol/L (ref 98–111)
Creatinine, Ser: 0.72 mg/dL (ref 0.44–1.00)
GFR calc Af Amer: >60 mL/min (ref >60)
GFR calc non Af Amer: >60 mL/min (ref >60)
Glucose, Bld: 145 mg/dL — ABNORMAL HIGH (ref 70–99)
Potassium: 3.1 mmol/L — ABNORMAL LOW (ref 3.5–5.1)
Sodium: 142 mmol/L (ref 135–145)

## 2019-04-15 LAB — GLUCOSE, CAPILLARY
Glucose-Capillary: 143 mg/dL — ABNORMAL HIGH (ref 70–99)
Glucose-Capillary: 162 mg/dL — ABNORMAL HIGH (ref 70–99)
Glucose-Capillary: 176 mg/dL — ABNORMAL HIGH (ref 70–99)
Glucose-Capillary: 106 mg/dL — ABNORMAL HIGH (ref 70–99)

## 2019-04-15 LAB — MAGNESIUM: Magnesium: 1.7 mg/dL (ref 1.7–2.4)

## 2019-04-15 MED ORDER — PEG 3350-KCL-NA BICARB-NACL 420 G PO SOLR
4000.0000 mL | Freq: Once | ORAL | Status: AC
  Administered 2019-04-15: 4000 mL via ORAL
  Filled 2019-04-15: qty 4000

## 2019-04-15 MED ORDER — POTASSIUM CHLORIDE CRYS ER 20 MEQ PO TBCR
40.0000 meq | EXTENDED_RELEASE_TABLET | Freq: Once | ORAL | Status: AC
  Administered 2019-04-15: 40 meq via ORAL
  Filled 2019-04-15: qty 2

## 2019-04-15 NOTE — Progress Notes (Signed)
1        [place] at [place] NAME: Sylvia [name]    MR#:  [id number]  DATE OF BIRTH:  02/08/1956  SUBJECTIVE:  CHIEF COMPLAINT:   Chief Complaint  Patient presents with  . Dizziness  Seems comfortable, no new complaints.  Likely colonoscopy tomorrow REVIEW OF SYSTEMS:  Review of Systems  Constitutional: Negative for diaphoresis, fever, malaise/fatigue and weight loss.  HENT: Negative for ear discharge, ear pain, hearing loss, nosebleeds, sore throat and tinnitus.   Eyes: Negative for blurred vision and pain.  Respiratory: Negative for cough, hemoptysis, shortness of breath and wheezing.   Cardiovascular: Negative for chest pain, palpitations, orthopnea and leg swelling.  Gastrointestinal: Positive for blood in stool. Negative for abdominal pain, constipation, diarrhea, heartburn, nausea and vomiting.  Genitourinary: Negative for dysuria, frequency and urgency.  Musculoskeletal: Negative for back pain and myalgias.  Skin: Negative for itching and rash.  Neurological: Negative for dizziness, tingling, tremors, focal weakness, seizures, weakness and headaches.  Psychiatric/Behavioral: Negative for depression. The patient is not nervous/anxious.    DRUG ALLERGIES:   Allergies  Allergen Reactions  . Penicillins Hives    Has patient had a PCN reaction causing immediate rash, facial/tongue/throat swelling, SOB or lightheadedness with hypotension: Yes Has patient had a PCN reaction causing severe rash involving mucus membranes or skin necrosis: No Has patient had a PCN reaction that required hospitalization: No Has patient had a PCN reaction occurring within the last 10 years: No If all of the above answers are "NO", then may proceed with Cephalosporin use.  . Tramadol Itching   VITALS:  Blood pressure 132/67, pulse 74, temperature 98.7 F (37.1 C), resp. rate 14, height 5\' 7"  (1.702 m), weight 58.1 kg, SpO2 99 %. PHYSICAL EXAMINATION:  Physical  Exam HENT:     Head: Normocephalic and atraumatic.  Eyes:     Conjunctiva/sclera: Conjunctivae normal.     Pupils: Pupils are equal, round, and reactive to light.  Neck:     Thyroid: No thyromegaly.     Trachea: No tracheal deviation.  Cardiovascular:     Rate and Rhythm: Normal rate and regular rhythm.     Heart sounds: Normal heart sounds.  Pulmonary:     Effort: Pulmonary effort is normal. No respiratory distress.     Breath sounds: Normal breath sounds. No wheezing.  Chest:     Chest wall: No tenderness.  Abdominal:     General: Bowel sounds are normal. There is no distension.     Palpations: Abdomen is soft.     Tenderness: There is no abdominal tenderness.  Musculoskeletal:        General: Normal range of motion.     Cervical back: Normal range of motion and neck supple.  Skin:    General: Skin is warm and dry.     Findings: No rash.  Neurological:     Mental Status: She is alert and oriented to person, place, and time.     Cranial Nerves: No cranial nerve deficit.    LABORATORY PANEL:  Female CBC Recent Labs  Lab 04/15/19 0415  WBC 12.1*  HGB 9.2*  HCT 25.9*  PLT 284   ------------------------------------------------------------------------------------------------------------------ Chemistries  Recent Labs  Lab 04/15/19 0415 04/15/19 0416  NA 142  --   K 3.1*  --   CL 110  --   CO2 23  --   GLUCOSE 145*  --   BUN 10  --   CREATININE 0.72  --  CALCIUM 9.1  --   MG  --  1.7   RADIOLOGY:  No results found. ASSESSMENT AND PLAN:  Sylvia [name] is a 63 y.o. female on chronic anticoagulation with Eliquis due to severe peripheral vascular disease, with history of right BKA, COPD, diabetes, hypertension, and peptic ulcer disease is being admitted for severe anemia.  She was admitted in September 2020 with acute upper GI bleed requiring 4 units of packed red blood cells with gastric ulcer seen on upper endoscopy.  She reports dizziness and 1 week history  of intermittent stool most recently the day prior to arrival .On arrival in the emergency room blood pressure initially 149/70 with a heart rate of 89 .hemoglobin was 6.0.    Recurrent Acute GI bleeding/melena in patient with known PUD and on chronic anticoagulation --Patient hemodynamically stable at this time --Hemoglobin 9.2 status post 2 units of packed red blood cell transfusion  --Status post EGD showing no signs of bleeding.  GI considering colonoscopy tomorrow --Continue protonix  --Clear liquid diet, n.p.o. after midnight --second hospitalization for same in 6 months. Need to weigh pros/cons of continued systemic antiagulation.    symptomatic Anemia associated with acute blood loss --Hb 9.2 this morning status post 2 units of packed red blood cell transfusion --No signs of bleeding seen on EGD   Non-insulin dependent type 2 diabetes mellitus (HCC) --hold home oral hypoglycemic agents patient will be n.p.o. --supplemental insulin coverage    PAD (peripheral artery disease) (HCC)s/p BKA --Holding all meds   Uncontrolled hypertension Controlled with metoprolol 50 mg twice daily    All the records are reviewed and case discussed with Care Management/Social Worker. Management plans discussed with the patient, nursing and they are in agreement.  CODE STATUS: Full Code  TOTAL TIME TAKING CARE OF THIS PATIENT: 35 minutes.   More than 50% of the time was spent in counseling/coordination of care: YES  POSSIBLE D/C IN 1-2 DAYS, DEPENDING ON CLINICAL CONDITION.  And GI evaluation   Delfino Lovett M.D on 04/15/2019 at 12:53 PM  Between 7am to 6pm - Pager - [phone]  After 6pm go to [email or web address] - password [email or web address]  [facility]   CC: Primary care physician; Center, [facility]  Note: This dictation was prepared with Nurse, children's dictation along with smaller Lobbyist. Any transcriptional errors that result from this process are unintentional.

## 2019-04-15 NOTE — Progress Notes (Signed)
Lucilla Lame, MD [facility]   [address], [place], [place] [address] Phone: [phone] Fax : [phone]   Subjective: The patient has had no further sign of bleeding and reports no further vomiting.  She thinks that her nausea and vomiting was from her nerves.  It started shortly before she even had been given medication or underwent her EGD.  The symptoms are completely gone and the EGD did not show any source of her GI bleeding.  The patient has never had a colonoscopy in the past either.   Objective: Vital signs in last 24 hours: Vitals:   04/14/19 1928 04/15/19 0450 04/15/19 0738 04/15/19 1521  BP: (!) 148/70 113/60 132/67 (!) 156/72  Pulse: 75 74 74 72  Resp: 16 17 14 14   Temp: 99.8 F (37.7 C) 99.1 F (37.3 C) 98.7 F (37.1 C) 98.7 F (37.1 C)  TempSrc: Oral Oral    SpO2: 100% 100% 99% 100%  Weight:      Height:       Weight change:   Intake/Output Summary (Last 24 hours) at 04/15/2019 1632 Last data filed at 04/15/2019 6789 Gross per 24 hour  Intake --  Output 1000 ml  Net -1000 ml     Exam: Heart:: Regular rate and rhythm, S1S2 present or without murmur or extra heart sounds Lungs: normal and clear to auscultation and percussion Abdomen: soft, nontender, normal bowel sounds   Lab Results: @LABTEST2 @ Micro Results: Recent Results (from the past 240 hour(s))  Respiratory Panel by RT PCR (Flu A&B, Covid) - Nasopharyngeal Swab     Status: None   Collection Time: 04/12/19 11:11 PM   Specimen: Nasopharyngeal Swab  Result Value Ref Range Status   SARS Coronavirus 2 by RT PCR NEGATIVE NEGATIVE Final    Comment: (NOTE) SARS-CoV-2 target nucleic acids are NOT DETECTED. The SARS-CoV-2 RNA is generally detectable in upper respiratoy specimens during the acute phase of infection. The lowest concentration of SARS-CoV-2 viral copies this assay can detect is 131 copies/mL. A negative result does not preclude SARS-Cov-2 infection and should not be used as  the sole basis for treatment or other patient management decisions. A negative result may occur with  improper specimen collection/handling, submission of specimen other than nasopharyngeal swab, presence of viral mutation(s) within the areas targeted by this assay, and inadequate number of viral copies (<131 copies/mL). A negative result must be combined with clinical observations, patient history, and epidemiological information. The expected result is Negative. Fact Sheet for Patients:  PinkCheek.be Fact Sheet for Healthcare Providers:  GravelBags.it This test is not yet ap proved or cleared by the [place] FDA and  has been authorized for detection and/or diagnosis of SARS-CoV-2 by FDA under an Emergency Use Authorization (EUA). This EUA will remain  in effect (meaning this test can be used) for the duration of the COVID-19 declaration under Section 564(b)(1) of the Act, 21 U.S.C. section 360bbb-3(b)(1), unless the authorization is terminated or revoked sooner.    Influenza A by PCR NEGATIVE NEGATIVE Final   Influenza B by PCR NEGATIVE NEGATIVE Final    Comment: (NOTE) The Xpert Xpress SARS-CoV-2/FLU/RSV assay is intended as an aid in  the diagnosis of influenza from Nasopharyngeal swab specimens and  should not be used as a sole basis for treatment. Nasal washings and  aspirates are unacceptable for Xpert Xpress SARS-CoV-2/FLU/RSV  testing. Fact Sheet for Patients: PinkCheek.be Fact Sheet for Healthcare Providers: GravelBags.it This test is not yet approved or cleared by the  [place] [employer] and  has been authorized for detection and/or diagnosis of SARS-CoV-2 by  FDA under an TEFL teacher (EUA). This EUA will remain  in effect (meaning this test can be used) for the duration of the  Covid-19 declaration under Section 564(b)(1) of the Act, 21   U.S.C. section 360bbb-3(b)(1), unless the authorization is  terminated or revoked. Performed at [facility], [address] [place], [place] [address]    Studies/Results: No results found. Medications: I have reviewed the patient's current medications. Scheduled Meds: . sodium chloride   Intravenous Once  . chlorhexidine  15 mL Mouth Rinse BID  . insulin aspart  0-5 Units Subcutaneous QHS  . insulin aspart  0-9 Units Subcutaneous TID WC  . insulin aspart  2 Units Subcutaneous TID WC  . mouth rinse  15 mL Mouth Rinse q12n4p  . metoprolol tartrate  50 mg Oral BID  . pantoprazole (PROTONIX) IV  40 mg Intravenous Q12H  . polyethylene glycol-electrolytes  4,000 mL Oral Once   Continuous Infusions: . sodium chloride    . sodium chloride 75 mL/hr at 04/15/19 0833   PRN Meds:.ondansetron **OR** ondansetron (ZOFRAN) IV, promethazine   Assessment: Principal Problem:   Acute upper GI bleeding Active Problems:   Non-insulin dependent type 2 diabetes mellitus (HCC)   PAD (peripheral artery disease) (HCC)   Anemia associated with acute blood loss   Chronic anticoagulation   Hx of right BKA (HCC)   Symptomatic anemia   Upper GI bleed   Acute GI bleeding    Plan: This patient has anemia with a history of peptic ulcer disease and a upper endoscopy that did not show any signs of bleeding.  The patient will be prepped for a colonoscopy for tomorrow.   LOS: 2 days   Midge Minium 04/15/2019, 4:32 PM Pager [phone] 7am-5pm  Check AMION for 5pm -7am coverage and on weekends

## 2019-04-16 ENCOUNTER — Inpatient Hospital Stay: Payer: Medicaid Other | Admitting: Certified Registered Nurse Anesthetist

## 2019-04-16 ENCOUNTER — Encounter
Admission: EM | Disposition: A | Discharge status: Home Health | Payer: Self-pay | Source: Home / Self Care | Attending: Internal Medicine

## 2019-04-16 DIAGNOSIS — K922 Gastrointestinal hemorrhage, unspecified: Secondary | ICD-10-CM | POA: Diagnosis not present

## 2019-04-16 DIAGNOSIS — K921 Melena: Principal | ICD-10-CM

## 2019-04-16 DIAGNOSIS — K635 Polyp of colon: Secondary | ICD-10-CM

## 2019-04-16 HISTORY — PX: GIVENS CAPSULE STUDY: SHX5432

## 2019-04-16 HISTORY — PX: COLONOSCOPY WITH PROPOFOL: SHX5780

## 2019-04-16 LAB — GLUCOSE, CAPILLARY
Glucose-Capillary: 184 mg/dL — ABNORMAL HIGH (ref 70–99)
Glucose-Capillary: 112 mg/dL — ABNORMAL HIGH (ref 70–99)
Glucose-Capillary: 126 mg/dL — ABNORMAL HIGH (ref 70–99)
Glucose-Capillary: 293 mg/dL — ABNORMAL HIGH (ref 70–99)
Glucose-Capillary: 156 mg/dL — ABNORMAL HIGH (ref 70–99)

## 2019-04-16 LAB — IRON AND TIBC
Iron: 54 ug/dL (ref 28–170)
Saturation Ratios: 18 % (ref 10.4–31.8)
TIBC: 308 ug/dL (ref 250–450)
UIBC: 254 ug/dL

## 2019-04-16 LAB — CBC
HCT: 24.8 % — ABNORMAL LOW (ref 36.0–46.0)
Hemoglobin: 8.4 g/dL — ABNORMAL LOW (ref 12.0–15.0)
MCH: 29.6 pg (ref 26.0–34.0)
MCHC: 33.9 g/dL (ref 30.0–36.0)
MCV: 87.3 fL (ref 80.0–100.0)
Platelets: 256 10*3/uL (ref 150–400)
RBC: 2.84 MIL/uL — ABNORMAL LOW (ref 3.87–5.11)
RDW: 14.9 % (ref 11.5–15.5)
WBC: 11 10*3/uL — ABNORMAL HIGH (ref 4.0–10.5)
nRBC: 0 % (ref 0.0–0.2)

## 2019-04-16 LAB — RETICULOCYTES
Immature Retic Fract: 16.4 % — ABNORMAL HIGH (ref 2.3–15.9)
RBC.: 3.06 MIL/uL — ABNORMAL LOW (ref 3.87–5.11)
Retic Count, Absolute: 97.6 10*3/uL (ref 19.0–186.0)
Retic Ct Pct: 3.2 % — ABNORMAL HIGH (ref 0.4–3.1)

## 2019-04-16 LAB — FERRITIN: Ferritin: 28 ng/mL (ref 11–307)

## 2019-04-16 LAB — VITAMIN B12: Vitamin B-12: 578 pg/mL (ref 180–914)

## 2019-04-16 LAB — FOLATE: Folate: 16.8 ng/mL (ref >5.9)

## 2019-04-16 SURGERY — COLONOSCOPY WITH PROPOFOL
Anesthesia: General

## 2019-04-16 MED ORDER — GLYCOPYRROLATE 0.2 MG/ML IJ SOLN
INTRAMUSCULAR | Status: DC | PRN
  Administered 2019-04-16: .2 mg via INTRAVENOUS

## 2019-04-16 MED ORDER — LIDOCAINE HCL (CARDIAC) PF 100 MG/5ML IV SOSY
PREFILLED_SYRINGE | INTRAVENOUS | Status: DC | PRN
  Administered 2019-04-16: 60 mg via INTRAVENOUS

## 2019-04-16 MED ORDER — ONDANSETRON HCL 4 MG/2ML IJ SOLN
INTRAMUSCULAR | Status: DC | PRN
  Administered 2019-04-16: 4 mg via INTRAVENOUS

## 2019-04-16 MED ORDER — GLYCOPYRROLATE 0.2 MG/ML IJ SOLN
INTRAMUSCULAR | Status: AC
  Filled 2019-04-16: qty 1

## 2019-04-16 MED ORDER — LIDOCAINE HCL (PF) 2 % IJ SOLN
INTRAMUSCULAR | Status: AC
  Filled 2019-04-16: qty 10

## 2019-04-16 MED ORDER — PROPOFOL 500 MG/50ML IV EMUL
INTRAVENOUS | Status: DC | PRN
  Administered 2019-04-16: 60 ug/kg/min via INTRAVENOUS

## 2019-04-16 MED ORDER — PROPOFOL 10 MG/ML IV BOLUS
INTRAVENOUS | Status: DC | PRN
  Administered 2019-04-16: 40 mg via INTRAVENOUS
  Administered 2019-04-16: 50 mg via INTRAVENOUS
  Administered 2019-04-16 (×2): 20 mg via INTRAVENOUS

## 2019-04-16 MED ORDER — ONDANSETRON HCL 4 MG/2ML IJ SOLN
INTRAMUSCULAR | Status: AC
  Filled 2019-04-16: qty 2

## 2019-04-16 MED ORDER — PROPOFOL 10 MG/ML IV BOLUS
INTRAVENOUS | Status: AC
  Filled 2019-04-16: qty 80

## 2019-04-16 NOTE — Op Note (Signed)
[facility] Patient Name: Sylvia [name] Procedure Date: 04/16/2019 10:01 AM MRN: [id number] Account #: [id number] Date of Birth: May 30, 1955 Admit Type: Inpatient Age: 63 Room: [facility] ENDO ROOM 3 Gender: Female Note Status: Finalized Procedure:             Colonoscopy Indications:           Melena Providers:             Naziyah Tieszen B. Maximino Greenland MD, MD Referring MD:          [facility]Dimple Nanas (Referring MD) Medicines:             Monitored Anesthesia Care Complications:         No immediate complications. Procedure:             Pre-Anesthesia Assessment:                        - ASA Grade Assessment: II - A patient with mild                         systemic disease.                        - Prior to the procedure, a History and Physical was                         performed, and patient medications, allergies and                         sensitivities were reviewed. The patient's tolerance                         of previous anesthesia was reviewed.                        - The risks and benefits of the procedure and the                         sedation options and risks were discussed with the                         patient. All questions were answered and informed                         consent was obtained.                        - Patient identification and proposed procedure were                         verified prior to the procedure by the physician, the                         nurse, the anesthesiologist, the anesthetist and the                         technician. The procedure was verified in the                         procedure room.  After obtaining informed consent, the colonoscope was                         passed under direct vision. Throughout the procedure,                         the patient's blood pressure, pulse, and oxygen                         saturations were monitored continuously. The                          Colonoscope was introduced through the anus and                         advanced to the the cecum, identified by appendiceal                         orifice and ileocecal valve. The colonoscopy was                         performed with ease. The patient tolerated the                         procedure well. The quality of the bowel preparation                         was poor. Findings:      The perianal and digital rectal examinations were normal.      Three sessile polyps were found in the ascending colon. The polyps were       4 to 6 mm in size. These polyps were removed with a cold snare.       Resection and retrieval were complete. For hemostasis, one hemostatic       clip was successfully placed. There was no bleeding at the end of the       procedure.      A few diverticula were found in the colon.      Three sessile and semi-pedunculated polyps were found in the sigmoid       colon and descending colon. The polyps were 5 to 6 mm in size. These       polyps were removed with a cold snare. Resection and retrieval were       complete. To prevent bleeding after the polypectomy, one hemostatic clip       was successfully placed. There was no bleeding at the end of the       procedure.      The exam was otherwise without abnormality.      The rectum, sigmoid colon, descending colon, transverse colon, ascending       colon and cecum appeared normal.      The retroflexed view of the distal rectum and anal verge was normal and       showed no anal or rectal abnormalities. Impression:            - Preparation of the colon was poor.                        - Three 4 to 6 mm polyps in the ascending colon,  removed with a cold snare. Resected and retrieved.                         Clip was placed.                        - Diverticulosis.                        - Three 5 to 6 mm polyps in the sigmoid colon and in                         the  descending colon, removed with a cold snare.                         Resected and retrieved. Clip was placed.                        - The examination was otherwise normal.                        - The rectum, sigmoid colon, descending colon,                         transverse colon, ascending colon and cecum are normal.                        - The distal rectum and anal verge are normal on                         retroflexion view. Recommendation:        - To visualize the small bowel, perform video capsule                         endoscopy due to melena and anemia at presentation.                        - Diet today as per small bowel capsule protocol - see                         paperwork instructions in chart                        - Continue present medications.                        - Await pathology results.                        - Repeat colonoscopy in 3 months, with 2 day prep.                        - The findings and recommendations were discussed with                         the patient.                        - Return to primary care physician as previously  scheduled. Procedure Code(s):     --- Professional ---                        [phone], Colonoscopy, flexible; with removal of                         tumor(s), polyp(s), or other lesion(s) by snare                         technique Diagnosis Code(s):     --- Professional ---                        K63.5, Polyp of colon                        K92.1, Melena (includes Hematochezia)                        K57.30, Diverticulosis of large intestine without                         perforation or abscess without bleeding CPT copyright 2019 American Medical Association. All rights reserved. The codes documented in this report are preliminary and upon coder review may  be revised to meet current compliance requirements.  Vonda Antigua, MD Margretta Sidle B. Bonna Gains MD, MD 04/16/2019 11:04:24 AM This  report has been signed electronically. Number of Addenda: 0 Note Initiated On: 04/16/2019 10:01 AM Scope Withdrawal Time: 0 hours 26 minutes 27 seconds  Total Procedure Duration: 0 hours 33 minutes 26 seconds  Estimated Blood Loss:  Estimated blood loss: none.      [facility]

## 2019-04-16 NOTE — Anesthesia Postprocedure Evaluation (Signed)
Anesthesia Post Note  Patient: Sylvia [name]  Procedure(s) Performed: COLONOSCOPY WITH PROPOFOL (N/A ) GIVENS CAPSULE STUDY  Patient location during evaluation: Endoscopy Anesthesia Type: General Level of consciousness: awake and alert and oriented Pain management: pain level controlled Vital Signs Assessment: post-procedure vital signs reviewed and stable Respiratory status: spontaneous breathing, nonlabored ventilation and respiratory function stable Cardiovascular status: blood pressure returned to baseline and stable Postop Assessment: no signs of nausea or vomiting Anesthetic complications: no     Last Vitals:  Vitals:   04/16/19 1047 04/16/19 1057  BP: 130/69 (!) 162/83  Pulse: 87   Resp: (!) 23   Temp: (!) 36.3 C   SpO2: 100%     Last Pain:  Vitals:   04/16/19 1057  TempSrc:   PainSc: 0-No pain                 Madelynne Lasker

## 2019-04-16 NOTE — Anesthesia Post-op Follow-up Note (Signed)
Anesthesia QCDR form completed.        

## 2019-04-16 NOTE — TOC Initial Note (Signed)
Transition of Care [facility]) - Initial/Assessment Note    Patient Details  Name: Sylvia [name] MRN: [id number] Date of Birth: 05/02/1955  Transition of Care  [facility] CM/SW Contact:    Shelbie Hutching, RN Phone Number: 04/16/2019, 2:38 PM  Clinical Narrative:                 Patient admitted for GI bleeding.  Patient went down for colonoscopy today and is undergoing a capsule study.  Patient is from home and lives with her daughter.  Patient was groggy after just coming back up from colonoscopy so RNCM will follow up with patient tomorrow to assess discharge needs.  Patient has had [facility] for home health services in the past.    Expected Discharge Plan: [place] Barriers to Discharge: Continued Medical Work up   Patient Goals and CMS Choice   CMS Medicare.gov Compare Post Acute Care list provided to:: Patient Choice offered to / list presented to : Patient  Expected Discharge Plan and Services Expected Discharge Plan: [place]   Discharge Planning Services: CM Consult Post Acute Care Choice: [place] arrangements for the past 2 months: Single Family Home                                      Prior Living Arrangements/Services Living arrangements for the past 2 months: Single Family Home Lives with:: Adult Children Patient language and need for interpreter reviewed:: Yes Do you feel safe going back to the place where you live?: Yes      Need for Family Participation in Patient Care: Yes (Comment)(GI bleeding) Care giver support system in place?: Yes (comment)(daughter and sister)   Criminal Activity/Legal Involvement Pertinent to Current Situation/Hospitalization: No - Comment as needed  Activities of Daily Living Home Assistive Devices/Equipment: Environmental consultant (specify type) ADL Screening (condition at time of admission) Patient's cognitive ability adequate to safely complete daily activities?: Yes Is the patient deaf or have  difficulty hearing?: No Does the patient have difficulty seeing, even when wearing glasses/contacts?: No Does the patient have difficulty concentrating, remembering, or making decisions?: No Patient able to express need for assistance with ADLs?: Yes Does the patient have difficulty dressing or bathing?: No Independently performs ADLs?: Yes (appropriate for developmental age) Does the patient have difficulty walking or climbing stairs?: No Weakness of Legs: None Weakness of Arms/Hands: None  Permission Sought/Granted Permission sought to share information with : Case Manager Permission granted to share information with : Yes, Verbal Permission Granted              Emotional Assessment Appearance:: Appears stated age Attitude/Demeanor/Rapport: Engaged Affect (typically observed): Accepting Orientation: : Oriented to Self, Oriented to Place, Oriented to  Time, Oriented to Situation Alcohol / Substance Use: Not Applicable Psych Involvement: No (comment)  Admission diagnosis:  Dizziness [R42] Acute GI bleeding [K92.2] Upper GI bleed [K92.2] Gastrointestinal hemorrhage, unspecified gastrointestinal hemorrhage type [K92.2] Anemia, unspecified type [D64.9] Patient Active Problem List   Diagnosis Date Noted  . Polyp of colon   . Acute upper GI bleeding 04/13/2019  . Anemia associated with acute blood loss 04/13/2019  . Chronic anticoagulation 04/13/2019  . Hx of right BKA ([place]) 04/13/2019  . Symptomatic anemia 04/13/2019  . Upper GI bleed 04/13/2019  . Acute GI bleeding 04/13/2019  . 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 Melena   .  DU (duodenal ulcer)   . DKA (diabetic ketoacidoses) (HCC) 12/14/2018  . Atherosclerosis of artery of extremity with rest pain (HCC) 08/28/2018  . PAD (peripheral artery disease) (HCC) 07/14/2018  . Abdominal pain 06/13/2018  . HTN (hypertension) 06/13/2018  . Non-insulin dependent type 2 diabetes mellitus (HCC)  06/13/2018  . Elevated lactic acid level 06/13/2018   PCP:  [facility]:   [facility] [address]), [place] - [address] [place]) [place] [address] Phone: [phone] Fax: [phone]  [facility] [address] [place], [place] - [address] [place] [address] Phone: [phone] Fax: [phone]  [id number] - [address], [place] - [address] [place] [address] Phone: [phone] Fax: [phone]  [facility] [place], [place] - [address] [place] [address] Phone: [phone] Fax: [phone]     Social Determinants of Health (SDOH) Interventions    Readmission Risk Interventions Readmission Risk Prevention Plan 12/30/2018  Transportation Screening Complete  PCP or Specialist Appt within 3-5 Days Complete  HRI or Home Care Consult Complete  Social Work Consult for Recovery Care Planning/Counseling Complete  Palliative Care Screening Complete  Medication Review Oceanographer) Complete  Some recent data might be hidden

## 2019-04-16 NOTE — Transfer of Care (Signed)
Immediate Anesthesia Transfer of Care Note  Patient: Sylvia [name]  Procedure(s) Performed: COLONOSCOPY WITH PROPOFOL (N/A )  Patient Location: PACU and Endoscopy Unit  Anesthesia Type:General  Level of Consciousness: awake, drowsy and patient cooperative  Airway & Oxygen Therapy: Patient Spontanous Breathing  Post-op Assessment: Report given to RN and Post -op Vital signs reviewed and stable  Post vital signs: Reviewed and stable  Last Vitals:  Vitals Value Taken Time  BP 130/69 04/16/19 1047  Temp    Pulse 87 04/16/19 1049  Resp 20 04/16/19 1049  SpO2 100 % 04/16/19 1049  Vitals shown include unvalidated device data.  Last Pain:  Vitals:   04/16/19 0744  TempSrc:   PainSc: Asleep         Complications: No apparent anesthesia complications

## 2019-04-16 NOTE — Progress Notes (Signed)
Patient completed bowel prep, no solid pieces noted in BM. Patient instructed to remain NPO until procedure.

## 2019-04-16 NOTE — Progress Notes (Signed)
Sylvia Antigua, MD [address] [place], [place], [place], [address] [address], [place], [place], [place], [address] Phone: [phone]  Fax: [phone]   Subjective: Denies any further bleeding with the prep.    Objective: Exam: Vital signs in last 24 hours: Vitals:   04/15/19 1521 04/15/19 1945 04/16/19 0457 04/16/19 0832  BP: (!) 156/72 140/65 (!) 133/57 (!) 180/81  Pulse: 72 77 77 77  Resp: 14 16 16 17   Temp: 98.7 F (37.1 C) 99 F (37.2 C) 99.2 F (37.3 C) 98.3 F (36.8 C)  TempSrc:  Oral Oral   SpO2: 100% 100% 97% 100%  Weight:      Height:       Weight change:   Intake/Output Summary (Last 24 hours) at 04/16/2019 1005 Last data filed at 04/16/2019 0459 Gross per 24 hour  Intake --  Output 6 ml  Net -6 ml    General: No acute distress, AAO x3 Abd: Soft, NT/ND, No HSM Skin: Warm, no rashes Neck: Supple, Trachea midline   Lab Results: Lab Results  Component Value Date   WBC 11.0 (H) 04/16/2019   HGB 8.4 (L) 04/16/2019   HCT 24.8 (L) 04/16/2019   MCV 87.3 04/16/2019   PLT 256 04/16/2019   Micro Results: Recent Results (from the past 240 hour(s))  Respiratory Panel by RT PCR (Flu A&B, Covid) - Nasopharyngeal Swab     Status: None   Collection Time: 04/12/19 11:11 PM   Specimen: Nasopharyngeal Swab  Result Value Ref Range Status   SARS Coronavirus 2 by RT PCR NEGATIVE NEGATIVE Final    Comment: (NOTE) SARS-CoV-2 target nucleic acids are NOT DETECTED. The SARS-CoV-2 RNA is generally detectable in upper respiratoy specimens during the acute phase of infection. The lowest concentration of SARS-CoV-2 viral copies this assay can detect is 131 copies/mL. A negative result does not preclude SARS-Cov-2 infection and should not be used as the sole basis for treatment or other patient management decisions. A negative result may occur with  improper specimen collection/handling, submission of specimen other than nasopharyngeal swab,  presence of viral mutation(s) within the areas targeted by this assay, and inadequate number of viral copies (<131 copies/mL). A negative result must be combined with clinical observations, patient history, and epidemiological information. The expected result is Negative. Fact Sheet for Patients:  PinkCheek.be Fact Sheet for Healthcare Providers:  GravelBags.it This test is not yet ap proved or cleared by the [place] FDA and  has been authorized for detection and/or diagnosis of SARS-CoV-2 by FDA under an Emergency Use Authorization (EUA). This EUA will remain  in effect (meaning this test can be used) for the duration of the COVID-19 declaration under Section 564(b)(1) of the Act, 21 U.S.C. section 360bbb-3(b)(1), unless the authorization is terminated or revoked sooner.    Influenza A by PCR NEGATIVE NEGATIVE Final   Influenza B by PCR NEGATIVE NEGATIVE Final    Comment: (NOTE) The Xpert Xpress SARS-CoV-2/FLU/RSV assay is intended as an aid in  the diagnosis of influenza from Nasopharyngeal swab specimens and  should not be used as a sole basis for treatment. Nasal washings and  aspirates are unacceptable for Xpert Xpress SARS-CoV-2/FLU/RSV  testing. Fact Sheet for Patients: PinkCheek.be Fact Sheet for Healthcare Providers: GravelBags.it This test is not yet approved or cleared by the [place] FDA and  has been authorized for detection and/or diagnosis of SARS-CoV-2 by  FDA under an Emergency Use Authorization (EUA). This EUA will remain  in effect (  meaning this test can be used) for the duration of the  Covid-19 declaration under Section 564(b)(1) of the Act, 21  U.S.C. section 360bbb-3(b)(1), unless the authorization is  terminated or revoked. Performed at [facility], [address] [place], [place] [address]    Studies/Results: No  results found. Medications:  Scheduled Meds: . [MAR Hold] sodium chloride   Intravenous Once  . [MAR Hold] chlorhexidine  15 mL Mouth Rinse BID  . [MAR Hold] insulin aspart  0-5 Units Subcutaneous QHS  . [MAR Hold] insulin aspart  0-9 Units Subcutaneous TID WC  . [MAR Hold] insulin aspart  2 Units Subcutaneous TID WC  . [MAR Hold] mouth rinse  15 mL Mouth Rinse q12n4p  . [MAR Hold] metoprolol tartrate  50 mg Oral BID  . [MAR Hold] pantoprazole (PROTONIX) IV  40 mg Intravenous Q12H   Continuous Infusions: . sodium chloride 75 mL/hr at 04/15/19 2207   PRN Meds:.[MAR Hold] ondansetron **OR** [MAR Hold] ondansetron (ZOFRAN) IV, [MAR Hold] promethazine   Assessment: Principal Problem:   Acute upper GI bleeding Active Problems:   Non-insulin dependent type 2 diabetes mellitus (HCC)   PAD (peripheral artery disease) (HCC)   Anemia associated with acute blood loss   Chronic anticoagulation   Hx of right BKA (HCC)   Symptomatic anemia   Upper GI bleed   Acute GI bleeding    Plan: Colonoscopy as planned based on previous discussions for anemia  I have discussed alternative options, risks & benefits,  which include, but are not limited to, bleeding, infection, perforation,respiratory complication & drug reaction.  The patient agrees with this plan & written consent will be obtained.      LOS: 3 days   [name], MD 04/16/2019, 10:05 AM

## 2019-04-16 NOTE — Progress Notes (Signed)
1        [facility] [place] NAME: Sylvia [name]    MR#:  [id number]  DATE OF BIRTH:  [birth year]  SUBJECTIVE:  CHIEF COMPLAINT:   Chief Complaint  Patient presents with  . Dizziness  Getting capsule endoscopy, status post colonoscopy earlier today.  No new complaints REVIEW OF SYSTEMS:  Review of Systems  Constitutional: Negative for diaphoresis, fever, malaise/fatigue and weight loss.  HENT: Negative for ear discharge, ear pain, hearing loss, nosebleeds, sore throat and tinnitus.   Eyes: Negative for blurred vision and pain.  Respiratory: Negative for cough, hemoptysis, shortness of breath and wheezing.   Cardiovascular: Negative for chest pain, palpitations, orthopnea and leg swelling.  Gastrointestinal: Positive for blood in stool. Negative for abdominal pain, constipation, diarrhea, heartburn, nausea and vomiting.  Genitourinary: Negative for dysuria, frequency and urgency.  Musculoskeletal: Negative for back pain and myalgias.  Skin: Negative for itching and rash.  Neurological: Negative for dizziness, tingling, tremors, focal weakness, seizures, weakness and headaches.  Psychiatric/Behavioral: Negative for depression. The patient is not nervous/anxious.    DRUG ALLERGIES:   Allergies  Allergen Reactions  . Penicillins Hives    Has patient had a PCN reaction causing immediate rash, facial/tongue/throat swelling, SOB or lightheadedness with hypotension: Yes Has patient had a PCN reaction causing severe rash involving mucus membranes or skin necrosis: No Has patient had a PCN reaction that required hospitalization: No Has patient had a PCN reaction occurring within the last 10 years: No If all of the above answers are "NO", then may proceed with Cephalosporin use.  . Tramadol Itching   VITALS:  Blood pressure (!) 158/73, pulse 64, temperature 98 F (36.7 C), temperature source Oral, resp. rate 17, height 5\' 7"  (1.702 m), weight 58.1 kg, SpO2  100 %. PHYSICAL EXAMINATION:  Physical Exam HENT:     Head: Normocephalic and atraumatic.  Eyes:     Conjunctiva/sclera: Conjunctivae normal.     Pupils: Pupils are equal, round, and reactive to light.  Neck:     Thyroid: No thyromegaly.     Trachea: No tracheal deviation.  Cardiovascular:     Rate and Rhythm: Normal rate and regular rhythm.     Heart sounds: Normal heart sounds.  Pulmonary:     Effort: Pulmonary effort is normal. No respiratory distress.     Breath sounds: Normal breath sounds. No wheezing.  Chest:     Chest wall: No tenderness.  Abdominal:     General: Bowel sounds are normal. There is no distension.     Palpations: Abdomen is soft.     Tenderness: There is no abdominal tenderness.  Musculoskeletal:        General: Normal range of motion.     Cervical back: Normal range of motion and neck supple.  Skin:    General: Skin is warm and dry.     Findings: No rash.  Neurological:     Mental Status: She is alert and oriented to person, place, and time.     Cranial Nerves: No cranial nerve deficit.    LABORATORY PANEL:  Female CBC Recent Labs  Lab 04/16/19 0549  WBC 11.0*  HGB 8.4*  HCT 24.8*  PLT 256   ------------------------------------------------------------------------------------------------------------------ Chemistries  Recent Labs  Lab 04/15/19 0415 04/15/19 0416  NA 142  --   K 3.1*  --   CL 110  --   CO2 23  --   GLUCOSE 145*  --   BUN  10  --   CREATININE 0.72  --   CALCIUM 9.1  --   MG  --  1.7   RADIOLOGY:  No results found. ASSESSMENT AND PLAN:  Sylvia [name] is a 63 y.o. female on chronic anticoagulation with Eliquis due to severe peripheral vascular disease, with history of right BKA, COPD, diabetes, hypertension, and peptic ulcer disease is being admitted for severe anemia.  She was admitted in September 2020 with acute upper GI bleed requiring 4 units of packed red blood cells with gastric ulcer seen on upper endoscopy.   She reports dizziness and 1 week history of intermittent stool most recently the day prior to arrival .On arrival in the emergency room blood pressure initially 149/70 with a heart rate of 89 .hemoglobin was 6.0.    Recurrent Acute GI bleeding/melena in patient with known PUD and on chronic anticoagulation --Patient hemodynamically stable at this time --Hemoglobin 8.4 status post 2 units of packed red blood cell transfusion  --Status post EGD showing no signs of bleeding.   -Status post colonoscopy earlier showing diverticulosis and colonic polyps.,  No obvious bleeding lesion -Getting capsule endoscopy today                               --Continue protonix  --Clear liquid diet, n.p.o. after midnight --second hospitalization for same in 6 months. Need to weigh pros/cons of continued systemic antiagulation.    symptomatic Anemia associated with acute blood loss --Hb 8.4 this morning status post 2 units of packed red blood cell transfusion --No signs of bleeding seen on EGD   Non-insulin dependent type 2 diabetes mellitus (HCC) --hold home oral hypoglycemic agents patient will be n.p.o. --supplemental insulin coverage    PAD (peripheral artery disease) (HCC)s/p BKA --Holding all meds   Uncontrolled hypertension Controlled with metoprolol 50 mg twice daily    All the records are reviewed and case discussed with Care Management/Social Worker. Management plans discussed with the patient, nursing and they are in agreement.  CODE STATUS: Full Code  TOTAL TIME TAKING CARE OF THIS PATIENT: 35 minutes.   More than 50% of the time was spent in counseling/coordination of care: YES  POSSIBLE D/C IN 1 DAYS, DEPENDING ON CLINICAL CONDITION.  And GI evaluation   Delfino Lovett M.D on 04/16/2019 at 4:03 PM  Between 7am to 6pm - Pager - [phone]  After 6pm go to [email or web address] - password [email or web address]  [facility]   CC: Primary care physician; Center, [facility]  Note: This dictation was prepared with Nurse, children's dictation along with smaller Lobbyist. Any transcriptional errors that result from this process are unintentional.

## 2019-04-16 NOTE — Anesthesia Preprocedure Evaluation (Signed)
Anesthesia Evaluation  Patient identified by MRN, date of birth, ID band Patient awake    Reviewed: Allergy & Precautions, NPO status , Patient's Chart, lab work & pertinent test results  History of Anesthesia Complications Negative for: history of anesthetic complications  Airway Mallampati: II  TM Distance: >3 FB Neck ROM: Full    Dental  (+) Edentulous Upper, Edentulous Lower   Pulmonary COPD, former smoker,    breath sounds clear to auscultation- rhonchi (-) wheezing      Cardiovascular hypertension, (-) CAD, (-) Past MI, (-) Cardiac Stents and (-) CABG  Rhythm:Regular Rate:Normal - Systolic murmurs and - Diastolic murmurs    Neuro/Psych neg Seizures Anxiety negative neurological ROS     GI/Hepatic Neg liver ROS, PUD, GERD  ,  Endo/Other  diabetes, Oral Hypoglycemic Agents  Renal/GU negative Renal ROS     Musculoskeletal negative musculoskeletal ROS (+)   Abdominal (+) - obese,   Peds  Hematology  (+) anemia ,   Anesthesia Other Findings Past Medical History: No date: Anxiety     Comment:  h/o No date: COPD (chronic obstructive pulmonary disease) (HCC) No date: Diabetes mellitus without complication (HCC) No date: GERD (gastroesophageal reflux disease) No date: Hypertension     Comment:  bp under control-off meds since 2019   Reproductive/Obstetrics                             Anesthesia Physical Anesthesia Plan  ASA: II  Anesthesia Plan: General   Post-op Pain Management:    Induction: Intravenous  PONV Risk Score and Plan: 2 and Propofol infusion  Airway Management Planned: Natural Airway  Additional Equipment:   Intra-op Plan:   Post-operative Plan:   Informed Consent: I have reviewed the patients History and Physical, chart, labs and discussed the procedure including the risks, benefits and alternatives for the proposed anesthesia with the patient or authorized  representative who has indicated his/her understanding and acceptance.     Dental advisory given  Plan Discussed with: CRNA and Anesthesiologist  Anesthesia Plan Comments:         Anesthesia Quick Evaluation

## 2019-04-16 NOTE — OR Nursing (Signed)
Video capsule started after consent from pt 's sister .capsule study initiated at1120 and ending at1920. rn spoke to primary rn [place]. PT TO BE NPO UNTIL 1520. NURSING STAFF I/S TO REMOVE DEVICE AT [facility].TEACHING INSTRUCTIONS GIVEN TO PT ALONG WITH IMPORTANCE OF NOT REMOVING DEVICE

## 2019-04-17 ENCOUNTER — Encounter: Payer: Self-pay | Admitting: *Deleted

## 2019-04-17 ENCOUNTER — Inpatient Hospital Stay: Payer: Medicaid Other

## 2019-04-17 DIAGNOSIS — R42 Dizziness and giddiness: Secondary | ICD-10-CM

## 2019-04-17 LAB — CBC
HCT: 24.7 % — ABNORMAL LOW (ref 36.0–46.0)
Hemoglobin: 8.9 g/dL — ABNORMAL LOW (ref 12.0–15.0)
MCH: 29.8 pg (ref 26.0–34.0)
MCHC: 36 g/dL (ref 30.0–36.0)
MCV: 82.6 fL (ref 80.0–100.0)
Platelets: 278 10*3/uL (ref 150–400)
RBC: 2.99 MIL/uL — ABNORMAL LOW (ref 3.87–5.11)
RDW: 14.5 % (ref 11.5–15.5)
WBC: 14.6 10*3/uL — ABNORMAL HIGH (ref 4.0–10.5)
nRBC: 0 % (ref 0.0–0.2)

## 2019-04-17 LAB — BASIC METABOLIC PANEL
Anion gap: 10 (ref 5–15)
BUN: 7 mg/dL — ABNORMAL LOW (ref 8–23)
CO2: 21 mmol/L — ABNORMAL LOW (ref 22–32)
Calcium: 9 mg/dL (ref 8.9–10.3)
Chloride: 108 mmol/L (ref 98–111)
Creatinine, Ser: 0.63 mg/dL (ref 0.44–1.00)
GFR calc Af Amer: >60 mL/min (ref >60)
GFR calc non Af Amer: >60 mL/min (ref >60)
Glucose, Bld: 172 mg/dL — ABNORMAL HIGH (ref 70–99)
Potassium: 3 mmol/L — ABNORMAL LOW (ref 3.5–5.1)
Sodium: 139 mmol/L (ref 135–145)

## 2019-04-17 LAB — GLUCOSE, CAPILLARY
Glucose-Capillary: 208 mg/dL — ABNORMAL HIGH (ref 70–99)
Glucose-Capillary: 132 mg/dL — ABNORMAL HIGH (ref 70–99)

## 2019-04-17 LAB — SURGICAL PATHOLOGY

## 2019-04-17 MED ORDER — DULOXETINE HCL 30 MG PO CPEP
30.0000 mg | ORAL_CAPSULE | Freq: Every day | ORAL | Status: DC
  Administered 2019-04-17: 30 mg via ORAL
  Filled 2019-04-17: qty 1

## 2019-04-17 MED ORDER — PANTOPRAZOLE SODIUM 40 MG PO TBEC: 40.0000 mg | DELAYED_RELEASE_TABLET | Freq: Every day | ORAL | Status: DC

## 2019-04-17 MED ORDER — VITAMIN B-12 100 MCG PO TABS
100.0000 ug | ORAL_TABLET | Freq: Every day | ORAL | Status: DC
  Administered 2019-04-17: 100 ug via ORAL
  Filled 2019-04-17: qty 1

## 2019-04-17 MED ORDER — DOCUSATE SODIUM 100 MG PO CAPS
100.0000 mg | ORAL_CAPSULE | Freq: Two times a day (BID) | ORAL | Status: DC
  Administered 2019-04-17: 100 mg via ORAL
  Filled 2019-04-17: qty 1

## 2019-04-17 MED ORDER — GABAPENTIN 300 MG PO CAPS
300.0000 mg | ORAL_CAPSULE | Freq: Every day | ORAL | Status: DC
  Administered 2019-04-17: 300 mg via ORAL
  Filled 2019-04-17: qty 1

## 2019-04-17 MED ORDER — POLYSACCHARIDE IRON COMPLEX 150 MG PO CAPS
150.0000 mg | ORAL_CAPSULE | Freq: Every day | ORAL | Status: DC
  Administered 2019-04-17: 150 mg via ORAL
  Filled 2019-04-17: qty 1

## 2019-04-17 MED ORDER — OXYCODONE HCL 5 MG PO TABS: 5.0000 mg | ORAL_TABLET | ORAL | Status: DC | PRN

## 2019-04-17 MED ORDER — VITAMIN D 25 MCG (1000 UNIT) PO TABS
1000.0000 [IU] | ORAL_TABLET | Freq: Every day | ORAL | Status: DC
  Administered 2019-04-17: 1000 [IU] via ORAL
  Filled 2019-04-17: qty 1

## 2019-04-17 MED ORDER — LORATADINE 10 MG PO TABS
10.0000 mg | ORAL_TABLET | Freq: Every day | ORAL | Status: DC
  Administered 2019-04-17: 10 mg via ORAL
  Filled 2019-04-17: qty 1

## 2019-04-17 MED ORDER — ASPIRIN EC 81 MG PO TBEC
81.0000 mg | DELAYED_RELEASE_TABLET | Freq: Every day | ORAL | Status: DC
  Administered 2019-04-17: 81 mg via ORAL
  Filled 2019-04-17: qty 1

## 2019-04-17 MED ORDER — METOPROLOL TARTRATE 50 MG PO TABS: 50.0000 mg | ORAL_TABLET | Freq: Two times a day (BID) | ORAL | Status: DC

## 2019-04-17 MED ORDER — LINAGLIPTIN 5 MG PO TABS
5.0000 mg | ORAL_TABLET | Freq: Every day | ORAL | Status: DC
  Administered 2019-04-17: 5 mg via ORAL
  Filled 2019-04-17: qty 1

## 2019-04-17 MED ORDER — ASCORBIC ACID 500 MG PO TABS
500.0000 mg | ORAL_TABLET | Freq: Every day | ORAL | Status: DC
  Administered 2019-04-17: 500 mg via ORAL
  Filled 2019-04-17: qty 1

## 2019-04-17 MED ORDER — LISINOPRIL 5 MG PO TABS
5.0000 mg | ORAL_TABLET | Freq: Every day | ORAL | Status: DC
  Administered 2019-04-17: 5 mg via ORAL
  Filled 2019-04-17: qty 1

## 2019-04-17 MED ORDER — APIXABAN 2.5 MG PO TABS
2.5000 mg | ORAL_TABLET | Freq: Two times a day (BID) | ORAL | Status: DC
  Administered 2019-04-17: 2.5 mg via ORAL
  Filled 2019-04-17: qty 1

## 2019-04-17 MED ORDER — DIAZEPAM 2 MG PO TABS
2.0000 mg | ORAL_TABLET | Freq: Two times a day (BID) | ORAL | Status: DC | PRN
  Administered 2019-04-17: 2 mg via ORAL
  Filled 2019-04-17: qty 1

## 2019-04-17 MED ORDER — GABAPENTIN 300 MG PO CAPS: 300.0000 mg | ORAL_CAPSULE | Freq: Every day | ORAL | Status: DC

## 2019-04-17 MED ORDER — ALBUTEROL SULFATE (2.5 MG/3ML) 0.083% IN NEBU: 2.5000 mg | INHALATION_SOLUTION | Freq: Four times a day (QID) | RESPIRATORY_TRACT | Status: DC | PRN

## 2019-04-17 MED ORDER — POTASSIUM CHLORIDE CRYS ER 20 MEQ PO TBCR
40.0000 meq | EXTENDED_RELEASE_TABLET | ORAL | Status: AC
  Administered 2019-04-17: 40 meq via ORAL
  Filled 2019-04-17: qty 2

## 2019-04-17 MED ORDER — ATORVASTATIN CALCIUM 20 MG PO TABS: 10.0000 mg | ORAL_TABLET | Freq: Every morning | ORAL | Status: DC

## 2019-04-17 MED ORDER — CLOPIDOGREL BISULFATE 75 MG PO TABS
75.0000 mg | ORAL_TABLET | Freq: Every day | ORAL | Status: DC
  Administered 2019-04-17: 75 mg via ORAL
  Filled 2019-04-17: qty 1

## 2019-04-17 NOTE — Progress Notes (Addendum)
[name], MD [address], [place], [place], [address] [address], [place], [place], [address] Phone: [phone]  Fax: [phone]   Subjective: No active bleeding   Objective: Exam: Vital signs in last 24 hours: Vitals:   04/16/19 1210 04/16/19 1726 04/16/19 1957 04/17/19 0748  BP: (!) 158/73 (!) 147/64 139/62 (!) 197/90  Pulse: 64 70 67 92  Resp: 17 18 18 16   Temp: 98 F (36.7 C) 98.2 F (36.8 C) 99.3 F (37.4 C) 97.9 F (36.6 C)  TempSrc: Oral Oral Oral Oral  SpO2: 100% 98% 100% 99%  Weight:      Height:       Weight change:   Intake/Output Summary (Last 24 hours) at 04/17/2019 1555 Last data filed at 04/17/2019 0400 Gross per 24 hour  Intake 760 ml  Output 1 ml  Net 759 ml    General: No acute distress, AAO x3 Abd: Soft, NT/ND, No HSM Skin: Warm, no rashes Neck: Supple, Trachea midline   Lab Results: Lab Results  Component Value Date   WBC 14.6 (H) 04/17/2019   HGB 8.9 (L) 04/17/2019   HCT 24.7 (L) 04/17/2019   MCV 82.6 04/17/2019   PLT 278 04/17/2019   Micro Results: Recent Results (from the past 240 hour(s))  Respiratory Panel by RT PCR (Flu A&B, Covid) - Nasopharyngeal Swab     Status: None   Collection Time: 04/12/19 11:11 PM   Specimen: Nasopharyngeal Swab  Result Value Ref Range Status   SARS Coronavirus 2 by RT PCR NEGATIVE NEGATIVE Final    Comment: (NOTE) SARS-CoV-2 target nucleic acids are NOT DETECTED. The SARS-CoV-2 RNA is generally detectable in upper respiratoy specimens during the acute phase of infection. The lowest concentration of SARS-CoV-2 viral copies this assay can detect is 131 copies/mL. A negative result does not preclude SARS-Cov-2 infection and should not be used as the sole basis for treatment or other patient management decisions. A negative result may occur with  improper specimen collection/handling, submission of specimen other than nasopharyngeal swab, presence of viral  mutation(s) within the areas targeted by this assay, and inadequate number of viral copies (<131 copies/mL). A negative result must be combined with clinical observations, patient history, and epidemiological information. The expected result is Negative. Fact Sheet for Patients:  04/14/19 Fact Sheet for Healthcare Providers:  https://www.moore.com/ This test is not yet ap proved or cleared by the https://www.young.biz/ FDA and  has been authorized for detection and/or diagnosis of SARS-CoV-2 by FDA under an Emergency Use Authorization (EUA). This EUA will remain  in effect (meaning this test can be used) for the duration of the COVID-19 declaration under Section 564(b)(1) of the Act, 21 U.S.C. section 360bbb-3(b)(1), unless the authorization is terminated or revoked sooner.    Influenza A by PCR NEGATIVE NEGATIVE Final   Influenza B by PCR NEGATIVE NEGATIVE Final    Comment: (NOTE) The Xpert Xpress SARS-CoV-2/FLU/RSV assay is intended as an aid in  the diagnosis of influenza from Nasopharyngeal swab specimens and  should not be used as a sole basis for treatment. Nasal washings and  aspirates are unacceptable for Xpert Xpress SARS-CoV-2/FLU/RSV  testing. Fact Sheet for Patients: [place] Fact Sheet for Healthcare Providers: https://www.moore.com/ This test is not yet approved or cleared by the https://www.young.biz/ FDA and  has been authorized for detection and/or diagnosis of SARS-CoV-2 by  FDA under an Emergency Use Authorization (EUA). This EUA will remain  in effect (meaning this test can  be used) for the duration of the  Covid-19 declaration under Section 564(b)(1) of the Act, 21  U.S.C. section 360bbb-3(b)(1), unless the authorization is  terminated or revoked. Performed at [facility], [address], [place], [place] [address]    Studies/Results: [place] Abd 2 Views  Result  Date: 04/17/2019 CLINICAL DATA:  Capsule evaluation. EXAM: ABDOMEN - 2 VIEW COMPARISON:  12/22/2018 FINDINGS: There is a metallic object projecting over the cecum. There is an additional linear metallic object projecting over the cecum. Vascular stents are noted. A metallic linear object projects over the patient's rectum. The bowel gas pattern is nonobstructive. There is an average amount of stool in the colon. The patient is likely status post prior cholecystectomy. Phleboliths project over the patient's pelvis. There is no pneumatosis. No free air. IMPRESSION: 1. A metallic capsule projects over the level of the cecum. 2. Additional surgical clips project over the cecum and rectum as detailed above. 3. Nonobstructive bowel gas pattern. Electronically Signed   By: Constance Holster M.D.   On: 04/17/2019 15:07   Medications:  Scheduled Meds: . apixaban  2.5 mg Oral BID  . vitamin C  500 mg Oral Daily  . aspirin EC  81 mg Oral Daily  . atorvastatin  10 mg Oral q morning - 10a  . chlorhexidine  15 mL Mouth Rinse BID  . cholecalciferol  1,000 Units Oral Daily  . clopidogrel  75 mg Oral Daily  . docusate sodium  100 mg Oral BID  . DULoxetine  30 mg Oral Daily  . gabapentin  300 mg Oral QHS  . insulin aspart  0-5 Units Subcutaneous QHS  . insulin aspart  0-9 Units Subcutaneous TID WC  . insulin aspart  2 Units Subcutaneous TID WC  . iron polysaccharides  150 mg Oral Daily  . linagliptin  5 mg Oral Daily  . lisinopril  5 mg Oral Daily  . loratadine  10 mg Oral Daily  . mouth rinse  15 mL Mouth Rinse q12n4p  . metoprolol tartrate  50 mg Oral BID  . pantoprazole (PROTONIX) IV  40 mg Intravenous Q12H  . vitamin B-12  100 mcg Oral Daily   Continuous Infusions: PRN Meds:.albuterol, ondansetron **OR** ondansetron (ZOFRAN) IV, promethazine   Assessment: Principal Problem:   Acute upper GI bleeding Active Problems:   Non-insulin dependent type 2 diabetes mellitus (HCC)   PAD (peripheral  artery disease) (HCC)   Melena   Anemia associated with acute blood loss   Chronic anticoagulation   Hx of right BKA (HCC)   Symptomatic anemia   Upper GI bleed   Acute GI bleeding   Polyp of colon    Plan: Small bowel capsule study was inconclusive.  Capsule did not reach the small bowel at the end of the study and food in the stomach affected visualization  Pt has not had any active bleeding  Repeat capsule study as an outpt  Follow up in clinic in 1-2 weeks  Risks and benefits of restarting anticoagulation to be weighed by primary team before restarting.   Obtain anemia workup  Hgb stable   LOS: 4 days   Vonda Antigua, MD 04/17/2019, 3:55 PM

## 2019-04-17 NOTE — Discharge Instructions (Signed)

## 2019-04-17 NOTE — Progress Notes (Signed)
PT FOR DISCHARGE. INSTRUCTIONS GIVEN/  NO NEW MEDS/  MEDS  FROM HOME DISCUSSED/ DIET ACTIVITY AND F/U DISCUSSED SL D/CD. VERBALIZE UNDERSTANDING OF ALL OUT VIA W/C W/O C/O

## 2019-04-19 NOTE — Discharge Summary (Signed)
[facility]   PATIENT NAME: Sylvia [name]    MR#:  [id number]  DATE OF BIRTH:  03/10/1956  DATE OF ADMISSION:  04/12/2019   ADMITTING PHYSICIAN: Andris BaumannHazel Duncan V, MD  DATE OF DISCHARGE: 04/17/2019  6:08 PM  PRIMARY CARE PHYSICIAN: [facility]   ADMISSION DIAGNOSIS:  Dizziness [R42] Acute GI bleeding [K92.2] Upper GI bleed [K92.2] Gastrointestinal hemorrhage, unspecified gastrointestinal hemorrhage type [K92.2] Anemia, unspecified type [D64.9] DISCHARGE DIAGNOSIS:  Principal Problem:   Acute upper GI bleeding Active Problems:   Non-insulin dependent type 2 diabetes mellitus (HCC)   PAD (peripheral artery disease) (HCC)   Melena   Anemia associated with acute blood loss   Chronic anticoagulation   Hx of right BKA (HCC)   Symptomatic anemia   Upper GI bleed   Acute GI bleeding   Polyp of colon   Dizziness  SECONDARY DIAGNOSIS:   Past Medical History:  Diagnosis Date  . Anxiety    h/o  . COPD (chronic obstructive pulmonary disease) (HCC)   . Diabetes mellitus without complication (HCC)   . GERD (gastroesophageal reflux disease)   . Hypertension    bp under control-off meds since 2019   HOSPITAL COURSE:  Sylvia LucksRoberta Montgomeryis a 63 y.o.femaleon chronic anticoagulationwithEliquis due to severe peripheral vascular disease, with history of right BKA, COPD, diabetes, hypertension, and peptic ulcer disease admitted for severe anemia.  She was admitted in September 2020 with acute upper GI bleed requiring 4 units of packed red blood cells with gastric ulcer seen on upper endoscopy.  She reported dizzinessand 1 week history of intermittent stool most recently the day prior to arrival.On arrival in the emergency room blood pressure initially 149/70 with a heart rate of 89 .hemoglobin was 6.0.   RecurrentAcute GI bleeding/melena in patient with known PUD and on chronic anticoagulation --Patient hemodynamically stable  at this time --Hemoglobin 8.9 status post 2 units of packed red blood cell transfusion  --Status post EGD showing no signs of bleeding.  -Status post colonoscopy earlier showing diverticulosis and colonic polyps.,  No obvious bleeding lesion - Small bowel capsule study was inconclusive. Capsule did not reach the small bowel at the end of the study and food in the stomach affected visualization. Pt has not had any active bleeding. GI recommends  Repeat capsule study as an outpt   symptomaticAnemia associated with acute blood loss --Hb 8.9 status post 2 units of packed red blood cell transfusion --No signs of bleeding seen on luminal eval  Non-insulin dependent type 2 diabetes mellitus (HCC)  PAD (peripheral artery disease) (HCC)s/p BKA --GI was ok with resume anticoagulation as there was no active bleeder seen on endoscopy  Uncontrolled hypertension Controlled with metoprolol 50 mg twice daily DISCHARGE CONDITIONS:  stable CONSULTS OBTAINED:  Treatment Team:  Midge MiniumWohl, Darren, MD DRUG ALLERGIES:   Allergies  Allergen Reactions  . Penicillins Hives    Has patient had a PCN reaction causing immediate rash, facial/tongue/throat swelling, SOB or lightheadedness with hypotension: Yes Has patient had a PCN reaction causing severe rash involving mucus membranes or skin necrosis: No Has patient had a PCN reaction that required hospitalization: No Has patient had a PCN reaction occurring within the last 10 years: No If all of the above answers are "NO", then may proceed with Cephalosporin use.  . Tramadol Itching   DISCHARGE MEDICATIONS:   Allergies as of 04/17/2019      Reactions   Penicillins Hives   Has patient had a  PCN reaction causing immediate rash, facial/tongue/throat swelling, SOB or lightheadedness with hypotension: Yes Has patient had a PCN reaction causing severe rash involving mucus membranes or skin necrosis: No Has patient had a PCN reaction that  required hospitalization: No Has patient had a PCN reaction occurring within the last 10 years: No If all of the above answers are "NO", then may proceed with Cephalosporin use.   Tramadol Itching      Medication List    STOP taking these medications   alum & mag hydroxide-simeth 200-200-20 MG/5ML suspension Commonly known as: MAALOX/MYLANTA   Biotin 1000 MCG tablet   HYDROcodone-acetaminophen 5-325 MG tablet Commonly known as: NORCO/VICODIN   oxyCODONE 5 MG immediate release tablet Commonly known as: Oxy IR/ROXICODONE     TAKE these medications   acetaminophen 325 MG tablet Commonly known as: TYLENOL Take 650 mg by mouth every 6 (six) hours as needed.   albuterol 108 (90 Base) MCG/ACT inhaler Commonly known as: VENTOLIN HFA Inhale 2 puffs into the lungs every 6 (six) hours as needed for wheezing or shortness of breath.   apixaban 2.5 MG Tabs tablet Commonly known as: ELIQUIS Take 1 tablet (2.5 mg total) by mouth 2 (two) times daily.   aspirin 81 MG EC tablet Take 1 tablet (81 mg total) by mouth daily.   atorvastatin 10 MG tablet Commonly known as: LIPITOR Take 10 mg by mouth every morning. What changed: Another medication with the same name was removed. Continue taking this medication, and follow the directions you see here.   cetirizine 10 MG tablet Commonly known as: ZYRTEC Take 10 mg by mouth daily.   cholecalciferol 25 MCG (1000 UT) tablet Commonly known as: VITAMIN D3 Take 1,000 Units by mouth daily.   clopidogrel 75 MG tablet Commonly known as: PLAVIX Take 75 mg by mouth daily.   docusate sodium 100 MG capsule Commonly known as: COLACE Take 100 mg by mouth 2 (two) times daily.   DULoxetine 30 MG capsule Commonly known as: CYMBALTA Take 30 mg by mouth daily.   gabapentin 300 MG capsule Commonly known as: NEURONTIN Take 1 capsule (300 mg total) by mouth at bedtime.   iron polysaccharides 150 MG capsule Commonly known as: NIFEREX Take 1 capsule  (150 mg total) by mouth daily.   lisinopril 5 MG tablet Commonly known as: ZESTRIL Take 1 tablet (5 mg total) by mouth daily.   metoprolol tartrate 50 MG tablet Commonly known as: LOPRESSOR Take 1 tablet (50 mg total) by mouth 2 (two) times daily.   pantoprazole 40 MG tablet Commonly known as: PROTONIX Take 1 tablet (40 mg total) by mouth daily. What changed: additional instructions   sitaGLIPtin 100 MG tablet Commonly known as: JANUVIA Take 100 mg by mouth daily.   vitamin B-12 100 MCG tablet Commonly known as: CYANOCOBALAMIN Take 100 mcg by mouth daily.   vitamin C 500 MG tablet Commonly known as: ASCORBIC ACID Take 500 mg by mouth daily.   WOMENS MULTIVITAMIN PO Take 1 tablet by mouth daily.      DISCHARGE INSTRUCTIONS:   DIET:  Regular diet DISCHARGE CONDITION:  Stable ACTIVITY:  Activity as tolerated OXYGEN:  Home Oxygen: No.  Oxygen Delivery: room air DISCHARGE LOCATION:  home   If you experience worsening of your admission symptoms, develop shortness of breath, life threatening emergency, suicidal or homicidal thoughts you must seek medical attention immediately by calling 911 or calling your MD immediately  if symptoms less severe.  You Must read complete instructions/literature  along with all the possible adverse reactions/side effects for all the Medicines you take and that have been prescribed to you. Take any new Medicines after you have completely understood and accpet all the possible adverse reactions/side effects.   Please note  You were cared for by a hospitalist during your hospital stay. If you have any questions about your discharge medications or the care you received while you were in the hospital after you are discharged, you can call the unit and asked to speak with the hospitalist on call if the hospitalist that took care of you is not available. Once you are discharged, your primary care physician will handle any further medical issues.  Please note that NO REFILLS for any discharge medications will be authorized once you are discharged, as it is imperative that you return to your primary care physician (or establish a relationship with a primary care physician if you do not have one) for your aftercare needs so that they can reassess your need for medications and monitor your lab values.    On the day of Discharge:  VITAL SIGNS:  Blood pressure 118/70, pulse 83, temperature 98.9 F (37.2 C), temperature source Oral, resp. rate 16, height 5\' 7"  (1.702 m), weight 58.1 kg, SpO2 100 %. PHYSICAL EXAMINATION:  GENERAL:  63 y.o.-year-old patient lying in the bed with no acute distress.  EYES: Pupils equal, round, reactive to light and accommodation. No scleral icterus. Extraocular muscles intact.  HEENT: Head atraumatic, normocephalic. Oropharynx and nasopharynx clear.  NECK:  Supple, no jugular venous distention. No thyroid enlargement, no tenderness.  LUNGS: Normal breath sounds bilaterally, no wheezing, rales,rhonchi or crepitation. No use of accessory muscles of respiration.  CARDIOVASCULAR: S1, S2 normal. No murmurs, rubs, or gallops.  ABDOMEN: Soft, non-tender, non-distended. Bowel sounds present. No organomegaly or mass.  EXTREMITIES: No pedal edema, cyanosis, or clubbing.  NEUROLOGIC: Cranial nerves II through XII are intact. Muscle strength 5/5 in all extremities. Sensation intact. Gait not checked.  PSYCHIATRIC: The patient is alert and oriented x 3.  SKIN: No obvious rash, lesion, or ulcer.  DATA REVIEW:   CBC Recent Labs  Lab 04/17/19 0449  WBC 14.6*  HGB 8.9*  HCT 24.7*  PLT 278    Chemistries  Recent Labs  Lab 04/15/19 0416 04/17/19 0449  NA  --  139  K  --  3.0*  CL  --  108  CO2  --  21*  GLUCOSE  --  172*  BUN  --  7*  CREATININE  --  0.63  CALCIUM  --  9.0  MG 1.7  --      Follow-up Information    [facility]. Schedule an appointment as soon as possible for a  visit in 1 week(s).   Specialty: General Practice Contact information: [address] [place] [address]        [phone], MD. Schedule an appointment as soon as possible for a visit in 2 week(s).   Specialty: Gastroenterology Contact information: [address] [place] [phone]           Management plans discussed with the patient, family and they are in agreement.  CODE STATUS: Prior   TOTAL TIME TAKING CARE OF THIS PATIENT: 45 minutes.    [phone] M.D on 04/19/2019 at 2:44 PM  Between 7am to 6pm - Pager - [phone]  After 6pm go to [email or web address] - password [email or web address]  [facility]  CC: Primary care physician; [facility]   Note: This dictation was prepared with Nurse, children's dictation along with smaller phrase technology. Any transcriptional errors that result from this process are unintentional.

## 2019-04-21 LAB — GLUCOSE, CAPILLARY: Glucose-Capillary: 149 mg/dL — ABNORMAL HIGH (ref 70–99)

## 2019-04-23 ENCOUNTER — Encounter: Payer: Self-pay | Admitting: Gastroenterology

## 2019-04-23 ENCOUNTER — Telehealth: Payer: Self-pay

## 2019-04-23 DIAGNOSIS — D649 Anemia, unspecified: Secondary | ICD-10-CM

## 2019-04-23 NOTE — Telephone Encounter (Signed)
Patient made appointment for 05/03/2019. Put in order for CBC patient will get this done the day before the procedure.

## 2019-04-23 NOTE — Telephone Encounter (Signed)
-----   Message from Pasty Spillers, MD sent at 04/23/2019 12:13 PM EST ----- Morrie Sheldon please let the patient know, her polyps were benign but precancerous.  Repeat recommended in 3 to 4 months.  Please have her follow-up with me with virtual appointment next week.  We have opened Tuesday morning clinic at 11, but you can overbook for any other day except Monday as well.  Please have her do a CBC 1 day prior to her appointment for anemia

## 2019-04-24 ENCOUNTER — Encounter (INDEPENDENT_AMBULATORY_CARE_PROVIDER_SITE_OTHER): Payer: MEDICAID

## 2019-04-24 ENCOUNTER — Ambulatory Visit (INDEPENDENT_AMBULATORY_CARE_PROVIDER_SITE_OTHER): Payer: MEDICAID | Admitting: Nurse Practitioner

## 2019-04-24 ENCOUNTER — Encounter (INDEPENDENT_AMBULATORY_CARE_PROVIDER_SITE_OTHER): Payer: Self-pay

## 2019-04-25 ENCOUNTER — Telehealth: Payer: Self-pay | Admitting: Pharmacist

## 2019-04-25 NOTE — Telephone Encounter (Signed)
04/25/2019 9:58:55 AM - Eliquis pending  04/25/2019 I have received the updated BMS application signed back from provider for Eliquis, I am holding for patient to return her portion-mailed to patient 04/09/2019.Sylvia [name]

## 2019-05-02 LAB — CBC
Hematocrit: 26.6 % — ABNORMAL LOW (ref 34.0–46.6)
Hemoglobin: 8.9 g/dL — ABNORMAL LOW (ref 11.1–15.9)
MCH: 29.5 pg (ref 26.6–33.0)
MCHC: 33.5 g/dL (ref 31.5–35.7)
MCV: 88 fL (ref 79–97)
Platelets: 439 10*3/uL (ref 150–450)
RBC: 3.02 x10E6/uL — ABNORMAL LOW (ref 3.77–5.28)
RDW: 14.1 % (ref 11.7–15.4)
WBC: 10.7 10*3/uL (ref 3.4–10.8)

## 2019-05-03 ENCOUNTER — Ambulatory Visit (INDEPENDENT_AMBULATORY_CARE_PROVIDER_SITE_OTHER): Payer: Medicaid Other | Admitting: Gastroenterology

## 2019-05-03 ENCOUNTER — Encounter: Payer: Self-pay | Admitting: Gastroenterology

## 2019-05-03 ENCOUNTER — Ambulatory Visit: Payer: Self-pay | Admitting: Gastroenterology

## 2019-05-03 ENCOUNTER — Telehealth: Payer: Self-pay | Admitting: Gastroenterology

## 2019-05-03 DIAGNOSIS — D649 Anemia, unspecified: Secondary | ICD-10-CM

## 2019-05-03 DIAGNOSIS — K921 Melena: Secondary | ICD-10-CM | POA: Diagnosis not present

## 2019-05-03 NOTE — Addendum Note (Signed)
Addended by: Radene Knee L on: 05/03/2019 12:41 PM   Modules accepted: Orders

## 2019-05-03 NOTE — Progress Notes (Signed)
[name], MD [address]  [address]  [place], [place] [address]  Main: [phone]  Fax: [phone]   Primary Care Physician: [facility]  Virtual Visit via Telephone Note  I connected with patient on 05/03/19 at 11:15 AM EST by telephone and verified that I am speaking with the correct person using two identifiers.   I discussed the limitations, risks, security and privacy concerns of performing an evaluation and management service by telephone and the availability of in person appointments. I also discussed with the patient that there may be a patient responsible charge related to this service. The patient expressed understanding and agreed to proceed.  Location of Patient: Home Location of Provider: Home Persons involved: Patient and provider only during the visit (nursing staff and front desk staff was involved in communicating with the patient prior to the appointment, reviewing medications and checking them in)   History of Present Illness: Chief Complaint  Patient presents with  . Colonoscopy    discuss procedures      HPI: Sylvia [name] is a 64 y.o. female being evaluated for hospital follow-up for melena and anemia.  Patient underwent EGD colonoscopy and small bowel capsule study during her hospitalization.  EGD was negative, colonoscopy with small polyps removed, small bowel capsule study did not reach this small bowel and was inconclusive.  Patient does report an episode of black stool 3 to 4 days ago and has resolved since then.  However, also started iron tablets.  No abdominal pain.  No nausea or vomiting.  Hemoglobin 2 days ago, which was 2 days after the suppose of black stool episode at home, is at baseline, 8.9.  Patient denies any NSAID use.  Is on Eliquis.  Current Outpatient Medications  Medication Sig Dispense Refill  . acetaminophen (TYLENOL) 325 MG tablet Take 650 mg by mouth every 6 (six) hours as needed.     Marland Kitchen albuterol (PROVENTIL HFA;VENTOLIN HFA) 108 (90 Base) MCG/ACT inhaler Inhale 2 puffs into the lungs every 6 (six) hours as needed for wheezing or shortness of breath. 1 Inhaler 2  . apixaban (ELIQUIS) 2.5 MG TABS tablet Take 1 tablet (2.5 mg total) by mouth 2 (two) times daily. 60 tablet 3  . aspirin 81 MG EC tablet Take 1 tablet (81 mg total) by mouth daily. 30 tablet 3  . atorvastatin (LIPITOR) 10 MG tablet Take 10 mg by mouth every morning.    . cetirizine (ZYRTEC) 10 MG tablet Take 10 mg by mouth daily.    . cholecalciferol (VITAMIN D3) 25 MCG (1000 UT) tablet Take 1,000 Units by mouth daily.    . clopidogrel (PLAVIX) 75 MG tablet Take 75 mg by mouth daily.    Marland Kitchen docusate sodium (COLACE) 100 MG capsule Take 100 mg by mouth 2 (two) times daily.    . DULoxetine (CYMBALTA) 30 MG capsule Take 30 mg by mouth daily.    Marland Kitchen gabapentin (NEURONTIN) 300 MG capsule Take 1 capsule (300 mg total) by mouth at bedtime. 30 capsule 2  . iron polysaccharides (NIFEREX) 150 MG capsule Take 1 capsule (150 mg total) by mouth daily. 30 capsule 0  . lisinopril (ZESTRIL) 5 MG tablet Take 1 tablet (5 mg total) by mouth daily. 30 tablet 0  . metoprolol tartrate (LOPRESSOR) 50 MG tablet Take 1 tablet (50 mg total) by mouth 2 (two) times daily. 60 tablet 0  . Multiple Vitamins-Minerals (WOMENS MULTIVITAMIN PO) Take 1 tablet by mouth daily.    Marland Kitchen  pantoprazole (PROTONIX) 40 MG tablet Take 1 tablet (40 mg total) by mouth daily. (Patient taking differently: Take 40 mg by mouth daily. Dennie Bible reports taking twice daily.) 30 tablet 0  . sitaGLIPtin (JANUVIA) 100 MG tablet Take 100 mg by mouth daily.    . vitamin B-12 (CYANOCOBALAMIN) 100 MCG tablet Take 100 mcg by mouth daily.    . vitamin C (ASCORBIC ACID) 500 MG tablet Take 500 mg by mouth daily.     No current facility-administered medications for this visit.    Allergies as of 05/03/2019 - Review Complete 05/03/2019  Allergen Reaction Noted  . Penicillins Hives 11/14/2015   . Tramadol Itching 12/14/2018    Review of Systems:    All systems reviewed and negative except where noted in HPI.   Observations/Objective:  Labs: CMP     Component Value Date/Time   NA 139 04/17/2019 0449   K 3.0 (L) 04/17/2019 0449   CL 108 04/17/2019 0449   CO2 21 (L) 04/17/2019 0449   GLUCOSE 172 (H) 04/17/2019 0449   BUN 7 (L) 04/17/2019 0449   CREATININE 0.63 04/17/2019 0449   CALCIUM 9.0 04/17/2019 0449   PROT 6.6 12/25/2018 0806   ALBUMIN 2.6 (L) 12/25/2018 0806   AST 117 (H) 12/25/2018 0806   ALT 56 (H) 12/25/2018 0806   ALKPHOS 104 12/25/2018 0806   BILITOT 0.8 12/25/2018 0806   GFRNONAA >60 04/17/2019 0449   GFRAA >60 04/17/2019 0449   Lab Results  Component Value Date   WBC 10.7 05/01/2019   HGB 8.9 (L) 05/01/2019   HCT 26.6 (L) 05/01/2019   MCV 88 05/01/2019   PLT 439 05/01/2019    Imaging Studies: DG Abd 2 Views  Result Date: 04/17/2019 CLINICAL DATA:  Capsule evaluation. EXAM: ABDOMEN - 2 VIEW COMPARISON:  12/22/2018 FINDINGS: There is a metallic object projecting over the cecum. There is an additional linear metallic object projecting over the cecum. Vascular stents are noted. A metallic linear object projects over the patient's rectum. The bowel gas pattern is nonobstructive. There is an average amount of stool in the colon. The patient is likely status post prior cholecystectomy. Phleboliths project over the patient's pelvis. There is no pneumatosis. No free air. IMPRESSION: 1. A metallic capsule projects over the level of the cecum. 2. Additional surgical clips project over the cecum and rectum as detailed above. 3. Nonobstructive bowel gas pattern. Electronically Signed   By: Katherine Mantle M.D.   On: 04/17/2019 15:07    Assessment and Plan:   Sylvia [name] is a 64 y.o. y/o female being evaluated for hospital follow-up for melena and anemia  Assessment and Plan: Repeat capsule study indicated as previous study was inconclusive  We  will try to schedule capsule study for next week with any available provider  Repeat hemoglobin in 1 week, and repeat visit in 1 to 2 weeks  We will also refer to hematology for anemia  Patient instructed to call us if melena returns, however if she has abdominal pain, nausea vomiting or any symptoms of concern  Repeat colonoscopy in 3 to 4 months  Follow Up Instructions: 1 to 2 weeks   I discussed the assessment and treatment plan with the patient. The patient was provided an opportunity to ask questions and all were answered. The patient agreed with the plan and demonstrated an understanding of the instructions.   The patient was advised to call back or seek an in-person evaluation if the symptoms worsen or if the condition  fails to improve as anticipated.  I provided 12 minutes of non-face-to-face time during this encounter. Additional time was spent in reviewing patient's chart, placing orders etc.   Virgel Manifold, MD  Speech recognition software was used to dictate this note.

## 2019-05-03 NOTE — Telephone Encounter (Signed)
ERROR

## 2019-05-07 ENCOUNTER — Other Ambulatory Visit: Payer: Self-pay

## 2019-05-07 ENCOUNTER — Telehealth: Payer: Self-pay

## 2019-05-07 DIAGNOSIS — D649 Anemia, unspecified: Secondary | ICD-10-CM

## 2019-05-07 NOTE — Telephone Encounter (Signed)
Called patient and scheduled patient for a capsule study on 06/25/2019. Unable to do it before then because it takes 6-8 weeks to get approved by Medicaid.

## 2019-05-10 LAB — CBC
Hematocrit: 24.6 % — ABNORMAL LOW (ref 34.0–46.6)
Hemoglobin: 8.1 g/dL — ABNORMAL LOW (ref 11.1–15.9)
MCH: 29 pg (ref 26.6–33.0)
MCHC: 32.9 g/dL (ref 31.5–35.7)
MCV: 88 fL (ref 79–97)
Platelets: 470 10*3/uL — ABNORMAL HIGH (ref 150–450)
RBC: 2.79 x10E6/uL — ABNORMAL LOW (ref 3.77–5.28)
RDW: 14 % (ref 11.7–15.4)
WBC: 10.2 10*3/uL (ref 3.4–10.8)

## 2019-05-14 ENCOUNTER — Telehealth: Payer: Self-pay

## 2019-05-14 DIAGNOSIS — D649 Anemia, unspecified: Secondary | ICD-10-CM

## 2019-05-14 NOTE — Telephone Encounter (Signed)
Hb still low , noted prior iron studies were normal   Can we refer to Dr Cathie Hoops to look into anemia ?   C/c Dr Cathie Hoops   Patient verbalized understanding of results. Put referral in to Dr. Cathie Hoops

## 2019-05-16 ENCOUNTER — Inpatient Hospital Stay: Payer: Medicaid Other | Attending: Oncology | Admitting: Oncology

## 2019-05-16 ENCOUNTER — Inpatient Hospital Stay: Payer: Medicaid Other

## 2019-05-16 ENCOUNTER — Encounter: Payer: Self-pay | Admitting: Oncology

## 2019-05-16 ENCOUNTER — Other Ambulatory Visit: Payer: Self-pay

## 2019-05-16 VITALS — BP 118/74 | HR 77 | Temp 96.7°F | Resp 16

## 2019-05-16 DIAGNOSIS — D508 Other iron deficiency anemias: Secondary | ICD-10-CM

## 2019-05-16 DIAGNOSIS — Z79899 Other long term (current) drug therapy: Secondary | ICD-10-CM | POA: Diagnosis not present

## 2019-05-16 DIAGNOSIS — Z803 Family history of malignant neoplasm of breast: Secondary | ICD-10-CM

## 2019-05-16 DIAGNOSIS — Z8719 Personal history of other diseases of the digestive system: Secondary | ICD-10-CM

## 2019-05-16 DIAGNOSIS — Z87891 Personal history of nicotine dependence: Secondary | ICD-10-CM

## 2019-05-16 DIAGNOSIS — I10 Essential (primary) hypertension: Secondary | ICD-10-CM | POA: Diagnosis not present

## 2019-05-16 DIAGNOSIS — F419 Anxiety disorder, unspecified: Secondary | ICD-10-CM | POA: Insufficient documentation

## 2019-05-16 DIAGNOSIS — Z89511 Acquired absence of right leg below knee: Secondary | ICD-10-CM | POA: Insufficient documentation

## 2019-05-16 DIAGNOSIS — D638 Anemia in other chronic diseases classified elsewhere: Secondary | ICD-10-CM

## 2019-05-16 DIAGNOSIS — Z7982 Long term (current) use of aspirin: Secondary | ICD-10-CM

## 2019-05-16 DIAGNOSIS — Z7901 Long term (current) use of anticoagulants: Secondary | ICD-10-CM | POA: Diagnosis not present

## 2019-05-16 DIAGNOSIS — Z7984 Long term (current) use of oral hypoglycemic drugs: Secondary | ICD-10-CM | POA: Insufficient documentation

## 2019-05-16 DIAGNOSIS — Z9049 Acquired absence of other specified parts of digestive tract: Secondary | ICD-10-CM | POA: Diagnosis not present

## 2019-05-16 DIAGNOSIS — E119 Type 2 diabetes mellitus without complications: Secondary | ICD-10-CM | POA: Insufficient documentation

## 2019-05-16 DIAGNOSIS — D649 Anemia, unspecified: Secondary | ICD-10-CM

## 2019-05-16 DIAGNOSIS — J449 Chronic obstructive pulmonary disease, unspecified: Secondary | ICD-10-CM | POA: Diagnosis not present

## 2019-05-16 HISTORY — DX: Anemia in other chronic diseases classified elsewhere: D63.8

## 2019-05-16 LAB — CBC WITH DIFFERENTIAL/PLATELET
Abs Immature Granulocytes: 0.02 10*3/uL (ref 0.00–0.07)
Basophils Absolute: 0.1 10*3/uL (ref 0.0–0.1)
Basophils Relative: 1 %
Eosinophils Absolute: 0.3 10*3/uL (ref 0.0–0.5)
Eosinophils Relative: 4 %
HCT: 24 % — ABNORMAL LOW (ref 36.0–46.0)
Hemoglobin: 7.8 g/dL — ABNORMAL LOW (ref 12.0–15.0)
Immature Granulocytes: 0 %
Lymphocytes Relative: 23 %
Lymphs Abs: 1.7 10*3/uL (ref 0.7–4.0)
MCH: 29.9 pg (ref 26.0–34.0)
MCHC: 32.5 g/dL (ref 30.0–36.0)
MCV: 92 fL (ref 80.0–100.0)
Monocytes Absolute: 0.5 10*3/uL (ref 0.1–1.0)
Monocytes Relative: 7 %
Neutro Abs: 4.7 10*3/uL (ref 1.7–7.7)
Neutrophils Relative %: 65 %
Platelets: 381 10*3/uL (ref 150–400)
RBC: 2.61 MIL/uL — ABNORMAL LOW (ref 3.87–5.11)
RDW: 16 % — ABNORMAL HIGH (ref 11.5–15.5)
WBC: 7.2 10*3/uL (ref 4.0–10.5)
nRBC: 0 % (ref 0.0–0.2)

## 2019-05-16 LAB — URINALYSIS, COMPLETE (UACMP) WITH MICROSCOPIC
Bacteria, UA: NONE SEEN
Bilirubin Urine: NEGATIVE
Glucose, UA: >=500 mg/dL — AB
Hgb urine dipstick: NEGATIVE
Ketones, ur: NEGATIVE mg/dL
Leukocytes,Ua: NEGATIVE
Nitrite: NEGATIVE
Protein, ur: NEGATIVE mg/dL
Specific Gravity, Urine: 1.013 (ref 1.005–1.030)
pH: 5 (ref 5.0–8.0)

## 2019-05-16 LAB — COMPREHENSIVE METABOLIC PANEL
ALT: 17 U/L (ref 0–44)
AST: 18 U/L (ref 15–41)
Albumin: 3.9 g/dL (ref 3.5–5.0)
Alkaline Phosphatase: 91 U/L (ref 38–126)
Anion gap: 8 (ref 5–15)
BUN: 11 mg/dL (ref 8–23)
CO2: 25 mmol/L (ref 22–32)
Calcium: 9.6 mg/dL (ref 8.9–10.3)
Chloride: 105 mmol/L (ref 98–111)
Creatinine, Ser: 0.7 mg/dL (ref 0.44–1.00)
GFR calc Af Amer: >60 mL/min (ref >60)
GFR calc non Af Amer: >60 mL/min (ref >60)
Glucose, Bld: 230 mg/dL — ABNORMAL HIGH (ref 70–99)
Potassium: 3.5 mmol/L (ref 3.5–5.1)
Sodium: 138 mmol/L (ref 135–145)
Total Bilirubin: 0.7 mg/dL (ref 0.3–1.2)
Total Protein: 7.6 g/dL (ref 6.5–8.1)

## 2019-05-16 LAB — RETIC PANEL
Immature Retic Fract: 29.4 % — ABNORMAL HIGH (ref 2.3–15.9)
RBC.: 2.64 MIL/uL — ABNORMAL LOW (ref 3.87–5.11)
Retic Count, Absolute: 132.8 10*3/uL (ref 19.0–186.0)
Retic Ct Pct: 5 % — ABNORMAL HIGH (ref 0.4–3.1)
Reticulocyte Hemoglobin: 33.8 pg (ref >27.9)

## 2019-05-16 LAB — IRON AND TIBC
Iron: 238 ug/dL — ABNORMAL HIGH (ref 28–170)
Saturation Ratios: 63 % — ABNORMAL HIGH (ref 10.4–31.8)
TIBC: 379 ug/dL (ref 250–450)
UIBC: 141 ug/dL

## 2019-05-16 LAB — TSH: TSH: 1.115 u[IU]/mL (ref 0.350–4.500)

## 2019-05-16 LAB — LACTATE DEHYDROGENASE: LDH: 144 U/L (ref 98–192)

## 2019-05-16 LAB — TECHNOLOGIST SMEAR REVIEW: Plt Morphology: ADEQUATE

## 2019-05-16 LAB — FERRITIN: Ferritin: 40 ng/mL (ref 11–307)

## 2019-05-16 NOTE — Progress Notes (Signed)
New evaluation for anemia with history of blood transfusion.

## 2019-05-16 NOTE — Progress Notes (Signed)
Hematology/Oncology Consult note [facility] Telephone:([phone] [phone]   Patient Care Team: Center, [facility] as PCP - General (General Practice)  REFERRING PROVIDER: Virgel Manifold, MD CHIEF COMPLAINTS/REASON FOR VISIT:  Evaluation of iron deficiency anemia  HISTORY OF PRESENTING ILLNESS:  Sylvia [name] is a  64 y.o.  female with PMH listed below was seen in consultation at the request of Virgel Manifold, MD   for evaluation of iron deficiency anemia.   Reviewed patient's recent labs  05/09/2019, hemoglobin 8.1, MCV 88, platelet 470. 04/16/2019, iron panel showed saturation 18, ferritin 28, TIBC 308 Patient has been started on oral iron supplementation NiFerrex 150 mg daily.  She reports tolerating well.  Reviewed patient's previous labs ordered by primary care physician's office, anemia is chronic onset , duration is since at least July 2017.  Patient's hemoglobin was about 9 back in September 2020.  She was admitted at that time due to severe PAD and right foot gangrene.  Status post right below-knee amputation.   She was admitted in December due to acute drop of hemoglobin to 6.  She has a history of GI bleed secondary to gastric ulcer.  Received PRBC transfusion.  Patient is on Eliquis 2.5 mg twice daily. No aggravating or improving factors.  Associated signs and symptoms: Patient reports fatigue.  Denies SOB with exertion.  Denies  easy bruising, hematochezia, hemoptysis, hematuria.  She has lost 15 pounds of weight recently. Context:  History of iron deficiency:  Rectal bleeding: Positive for melena.  No recent rectal bleeding. Menstrual bleeding/ Vaginal bleeding : Denies Hematemesis or hemoptysis : denies Blood in urine : denies  Last endoscopy: 04/13/2019 EGD colonoscopy and small bowel capsule study during her hospitalization.  EGD was negative, 04/16/2019 colonoscopy with small polyps removed,  04/18/2019 small bowel capsule study did not reach this small bowel and was inconclusive.  Fatigue: Yes.  SOB: deneis    Review of Systems  Constitutional: Positive for fatigue and unexpected weight change. Negative for appetite change, chills and fever.  HENT:   Negative for hearing loss and voice change.   Eyes: Negative for eye problems.  Respiratory: Negative for chest tightness and cough.   Cardiovascular: Negative for chest pain.  Gastrointestinal: Negative for abdominal distention, abdominal pain and blood in stool.  Endocrine: Negative for hot flashes.  Genitourinary: Negative for difficulty urinating and frequency.   Musculoskeletal: Negative for arthralgias.  Skin: Negative for itching and rash.  Neurological: Negative for extremity weakness.  Hematological: Negative for adenopathy.  Psychiatric/Behavioral: Negative for confusion.    MEDICAL HISTORY:  Past Medical History:  Diagnosis Date  . Anxiety    h/o  . COPD (chronic obstructive pulmonary disease) ([place])   . Diabetes mellitus without complication ([place])   . GERD (gastroesophageal reflux disease)   . Hypertension    bp under control-off meds since 2019    SURGICAL HISTORY: Past Surgical History:  Procedure Laterality Date  . ABDOMINAL HYSTERECTOMY    . AMPUTATION Right 12/27/2018   Procedure: AMPUTATION BELOW KNEE;  Surgeon: Algernon Huxley, MD;  Location: [facility] OR[facility];  Service: General;  Laterality: Right;  . CHOLECYSTECTOMY    . COLONOSCOPY WITH PROPOFOL N/A 04/16/2019   Procedure: COLONOSCOPY WITH PROPOFOL;  Surgeon: Virgel Manifold, MD;  Location: [facility] END[facility]COPY;  Service: Endoscopy;  Laterality: N/A;  . ESOPHAGOGASTRODUODENOSCOPY (EGD) WITH PROPOFOL N/A 06/15/2018   Procedure: ESOPHAGOGASTRODUODENOSCOPY (EGD) WITH PROPOFOL;  Surgeon: [place], Benay Pike, MD;  Location: [facility] END[facility]COPY;  Service: Gastroenterology;  Laterality: N/A;  . ESOPHAGOGASTRODUODENOSCOPY (EGD) WITH PROPOFOL N/A 01/03/2019   Procedure:  ESOPHAGOGASTRODUODENOSCOPY (EGD) WITH PROPOFOL;  Surgeon: Lucilla Lame, MD;  Location: [facility] END[facility]COPY;  Service: Endoscopy;  Laterality: N/A;  . GIVENS CAPSULE STUDY  04/16/2019   Procedure: GIVENS CAPSULE STUDY;  Surgeon: Virgel Manifold, MD;  Location: [facility] END[facility];  Service: Endoscopy;;  . LOWER EXTREMITY ANGIOGRAPHY Right 08/21/2018   Procedure: LOWER EXTREMITY ANGIOGRAPHY;  Surgeon: Algernon Huxley, MD;  Location: [place] [facility];  Service: Cardiovascular;  Laterality: Right;  . LOWER EXTREMITY ANGIOGRAPHY Left 08/28/2018   Procedure: LOWER EXTREMITY ANGIOGRAPHY;  Surgeon: Algernon Huxley, MD;  Location: [place] [facility];  Service: Cardiovascular;  Laterality: Left;  . LOWER EXTREMITY ANGIOGRAPHY Right 08/28/2018   Procedure: Lower Extremity Angiography;  Surgeon: Algernon Huxley, MD;  Location: [place] [facility];  Service: Cardiovascular;  Laterality: Right;  . LOWER EXTREMITY ANGIOGRAPHY Right 12/18/2018   Procedure: Lower Extremity Angiography;  Surgeon: Algernon Huxley, MD;  Location: [place] [facility];  Service: Cardiovascular;  Laterality: Right;  . LOWER EXTREMITY ANGIOGRAPHY Right 12/18/2018   Procedure: Lower Extremity Angiography;  Surgeon: Algernon Huxley, MD;  Location: [place] [facility];  Service: Cardiovascular;  Laterality: Right;  . LOWER EXTREMITY ANGIOGRAPHY Left 12/21/2018   Procedure: Lower Extremity Angiography;  Surgeon: Algernon Huxley, MD;  Location: [place] [facility];  Service: Cardiovascular;  Laterality: Left;  . LOWER EXTREMITY ANGIOGRAPHY Right 12/21/2018   Procedure: Lower Extremity Angiography;  Surgeon: Algernon Huxley, MD;  Location: [place] [facility];  Service: Cardiovascular;  Laterality: Right;  . LOWER EXTREMITY INTERVENTION N/A 12/22/2018   Procedure: LOWER EXTREMITY INTERVENTION;  Surgeon: Algernon Huxley, MD;  Location: [place] [facility];  Service: Cardiovascular;  Laterality: N/A;    SOCIAL HISTORY: Social History   Socioeconomic History  . Marital  status: Divorced    Spouse name: Not on file  . Number of children: Not on file  . Years of education: Not on file  . Highest education level: Not on file  Occupational History  . Not on file  Tobacco Use  . Smoking status: Former Smoker    Packs/day: 0.25    Years: 45.00    Pack years: 11.25    Types: Cigarettes    Quit date: 08/28/2018    Years since quitting: 0.7  . Smokeless tobacco: Never Used  . Tobacco comment: quit  Substance and Sexual Activity  . Alcohol use: No  . Drug use: Never  . Sexual activity: Not Currently  Other Topics Concern  . Not on file  Social History Narrative  . Not on file   Social Determinants of Health   Financial Resource Strain: Low Risk   . Difficulty of Paying Living Expenses: Not hard at all  Food Insecurity: Food Insecurity Present  . Worried About Charity fundraiser in the Last Year: Sometimes true  . Ran Out of Food in the Last Year: Sometimes true  Transportation Needs: No Transportation Needs  . Lack of Transportation (Medical): No  . Lack of Transportation (Non-Medical): No  Physical Activity: Insufficiently Active  . Days of Exercise per Week: 4 days  . Minutes of Exercise per Session: 20 min  Stress: No Stress Concern Present  . Feeling of Stress : Only a little  Social Connections: Unknown  . Frequency of Communication with Friends and Family: Patient refused  . Frequency of Social Gatherings with Friends and Family:  Patient refused  . Attends Religious Services: Patient refused  . Active Member of Clubs or Organizations: Patient refused  . Attends Archivist Meetings: Patient refused  . Marital Status: Patient refused  Intimate Partner Violence: Unknown  . Fear of Current or Ex-Partner: Patient refused  . Emotionally Abused: Patient refused  . Physically Abused: Patient refused  . Sexually Abused: Patient refused    FAMILY HISTORY: Family History  Problem Relation Age of Onset  . Breast cancer Mother 26     ALLERGIES:  is allergic to penicillins and tramadol.  MEDICATIONS:  Current Outpatient Medications  Medication Sig Dispense Refill  . acetaminophen (TYLENOL) 325 MG tablet Take 650 mg by mouth every 6 (six) hours as needed.    Marland Kitchen albuterol (PROVENTIL HFA;VENTOLIN HFA) 108 (90 Base) MCG/ACT inhaler Inhale 2 puffs into the lungs every 6 (six) hours as needed for wheezing or shortness of breath. 1 Inhaler 2  . apixaban (ELIQUIS) 2.5 MG TABS tablet Take 1 tablet (2.5 mg total) by mouth 2 (two) times daily. 60 tablet 3  . aspirin 81 MG EC tablet Take 1 tablet (81 mg total) by mouth daily. 30 tablet 3  . atorvastatin (LIPITOR) 10 MG tablet Take 10 mg by mouth every morning.    . cetirizine (ZYRTEC) 10 MG tablet Take 10 mg by mouth daily.    . cholecalciferol (VITAMIN D3) 25 MCG (1000 UT) tablet Take 1,000 Units by mouth daily.    . clopidogrel (PLAVIX) 75 MG tablet Take 75 mg by mouth daily.    Marland Kitchen docusate sodium (COLACE) 100 MG capsule Take 100 mg by mouth 2 (two) times daily.    . DULoxetine (CYMBALTA) 30 MG capsule Take 30 mg by mouth daily.    Marland Kitchen gabapentin (NEURONTIN) 300 MG capsule Take 1 capsule (300 mg total) by mouth at bedtime. 30 capsule 2  . iron polysaccharides (NIFEREX) 150 MG capsule Take 1 capsule (150 mg total) by mouth daily. 30 capsule 0  . lisinopril (ZESTRIL) 5 MG tablet Take 1 tablet (5 mg total) by mouth daily. 30 tablet 0  . metoprolol tartrate (LOPRESSOR) 50 MG tablet Take 1 tablet (50 mg total) by mouth 2 (two) times daily. 60 tablet 0  . Multiple Vitamins-Minerals (WOMENS MULTIVITAMIN PO) Take 1 tablet by mouth daily.    . pantoprazole (PROTONIX) 40 MG tablet Take 1 tablet (40 mg total) by mouth daily. (Patient taking differently: Take 40 mg by mouth daily. Fraser Din reports taking twice daily.) 30 tablet 0  . sitaGLIPtin (JANUVIA) 100 MG tablet Take 100 mg by mouth daily.    . vitamin B-12 (CYANOCOBALAMIN) 100 MCG tablet Take 100 mcg by mouth daily.    . vitamin C (ASCORBIC  ACID) 500 MG tablet Take 500 mg by mouth daily.     No current facility-administered medications for this visit.     PHYSICAL EXAMINATION: ECOG PERFORMANCE STATUS: 1 - Symptomatic but completely ambulatory Vitals:   05/16/19 1116  BP: 118/74  Pulse: 77  Resp: 16  Temp: (!) 96.7 F (35.9 C)   There were no vitals filed for this visit.  Physical Exam Constitutional:      General: She is not in acute distress.    Comments: Patient sits in the wheelchair.  HENT:     Head: Normocephalic and atraumatic.  Eyes:     General: No scleral icterus. Cardiovascular:     Rate and Rhythm: Normal rate and regular rhythm.     Heart sounds: Normal heart  sounds.  Pulmonary:     Effort: Pulmonary effort is normal. No respiratory distress.     Breath sounds: No wheezing.  Abdominal:     General: Bowel sounds are normal. There is no distension.     Palpations: Abdomen is soft.  Musculoskeletal:        General: No deformity. Normal range of motion.     Cervical back: Normal range of motion and neck supple.     Comments: Right lower extremity below-knee amputation, stump is well-healed.  No discharge.  Skin:    General: Skin is warm and dry.     Findings: No erythema or rash.  Neurological:     Mental Status: She is alert and oriented to person, place, and time. Mental status is at baseline.     Cranial Nerves: No cranial nerve deficit.     Coordination: Coordination normal.  Psychiatric:        Mood and Affect: Mood normal.       CMP Latest Ref Rng & Units 05/16/2019  Glucose 70 - 99 mg/dL 230(H)  BUN 8 - 23 mg/dL 11  Creatinine 0.44 - 1.00 mg/dL 0.70  Sodium 135 - 145 mmol/L 138  Potassium 3.5 - 5.1 mmol/L 3.5  Chloride 98 - 111 mmol/L 105  CO2 22 - 32 mmol/L 25  Calcium 8.9 - 10.3 mg/dL 9.6  Total Protein 6.5 - 8.1 g/dL 7.6  Total Bilirubin 0.3 - 1.2 mg/dL 0.7  Alkaline Phos 38 - 126 U/L 91  AST 15 - 41 U/L 18  ALT 0 - 44 U/L 17   CBC Latest Ref Rng & Units 05/16/2019   WBC 4.0 - 10.5 K/uL 7.2  Hemoglobin 12.0 - 15.0 g/dL 7.8(L)  Hematocrit 36.0 - 46.0 % 24.0(L)  Platelets 150 - 400 K/uL 381     LABORATORY DATA:  I have reviewed the data as listed Lab Results  Component Value Date   WBC 7.2 05/16/2019   HGB 7.8 (L) 05/16/2019   HCT 24.0 (L) 05/16/2019   MCV 92.0 05/16/2019   PLT 381 05/16/2019   Recent Labs    06/13/18 1313 06/14/18 0508 12/14/18 1240 12/14/18 2237 12/25/18 0806 12/26/18 0328 04/15/19 0415 04/17/19 0449 05/16/19 1142  NA 139   < > 141   < > 141   < > 142 139 138  K 3.8   < > 3.8   < > 4.4   < > 3.1* 3.0* 3.5  CL 107   < > 104   < > 111   < > 110 108 105  CO2 22   < > 20*   < > 20*   < > 23 21* 25  GLUCOSE 288*   < > 291*   < > 104*   < > 145* 172* 230*  BUN 11   < > 14   < > 28*   < > 10 7* 11  CREATININE 0.80   < > 1.04*   < > 1.62*   < > 0.72 0.63 0.70  CALCIUM 10.0   < > 10.8*   < > 8.6*   < > 9.1 9.0 9.6  GFRNONAA >60   < > 57*   < > 33*   < > >60 >60 >60  GFRAA >60   < > >60   < > 38*   < > >60 >60 >60  PROT 8.1  --  8.4*  --  6.6  --   --   --  7.6  ALBUMIN 4.4  --  4.3  --  2.6*  --   --   --  3.9  AST 15  --  17  --  117*  --   --   --  18  ALT 13  --  15  --  56*  --   --   --  17  ALKPHOS 94  --  86  --  104  --   --   --  91  BILITOT 1.0  --  1.2  --  0.8  --   --   --  0.7  BILIDIR 0.1  --  0.1  --   --   --   --   --   --   IBILI 0.9  --  1.1*  --   --   --   --   --   --    < > = values in this interval not displayed.   Iron/TIBC/Ferritin/ %Sat    Component Value Date/Time   IRON 238 (H) 05/16/2019 1142   TIBC 379 05/16/2019 1142   FERRITIN 40 05/16/2019 1142   IRONPCTSAT 63 (H) 05/16/2019 1142     DG Abd 2 Views  Result Date: 04/17/2019 CLINICAL DATA:  Capsule evaluation. EXAM: ABDOMEN - 2 VIEW COMPARISON:  12/22/2018 FINDINGS: There is a metallic object projecting over the cecum. There is an additional linear metallic object projecting over the cecum. Vascular stents are noted. A metallic  linear object projects over the patient's rectum. The bowel gas pattern is nonobstructive. There is an average amount of stool in the colon. The patient is likely status post prior cholecystectomy. Phleboliths project over the patient's pelvis. There is no pneumatosis. No free air. IMPRESSION: 1. A metallic capsule projects over the level of the cecum. 2. Additional surgical clips project over the cecum and rectum as detailed above. 3. Nonobstructive bowel gas pattern. Electronically Signed   By: Constance Holster M.D.   On: 04/17/2019 15:07      ASSESSMENT & PLAN:  1. Normocytic anemia   2. History of gastrointestinal bleeding   3. Anemia of chronic disease    Anemia: multifactorial with possible causes including chronic blood loss, hyper/hypothyroidism, nutritional deficiency, infection/chronic inflammation, hemolysis, underlying bone marrow disorders. Will check CBC w differential, CMP, LDH, iron, TIBC ferritin, TSH, reticulocyte panel, Technician smear, urine analysis. Vitamin B12 and folate were checked on 04/16/2019 and the level was normal.  Today's work-up lab results were available after patient's clinic.  Labs reviewed. Ferritin 40, iron 238, saturation 62.  Patient has reticulocyte hemoglobin 33.8.  Increased immature reticulocyte fraction. Normal LDH, normal bilirubin.  Less likely hemolysis. Normal TSH.  Urinalysis showed no RBC in the urine. Anemia likely secondary to chronic disease, possible blood loss. I will check multiple myeloma panel, CRP, ESR, ANA  She may benefit from additional IV iron treatment and erythropoietin therapy.  Orders Placed This Encounter  Procedures  . CBC with Differential/Platelet    Standing Status:   Future    Number of Occurrences:   1    Standing Expiration Date:   05/15/2020  . Comprehensive metabolic panel    Standing Status:   Future    Number of Occurrences:   1    Standing Expiration Date:   05/15/2020  . Technologist smear review     Standing Status:   Future    Number of Occurrences:   1    Standing Expiration Date:  05/15/2020  . Retic Panel    Standing Status:   Future    Number of Occurrences:   1    Standing Expiration Date:   05/15/2020  . TSH    Standing Status:   Future    Number of Occurrences:   1    Standing Expiration Date:   05/15/2020  . Lactate dehydrogenase    Standing Status:   Future    Number of Occurrences:   1    Standing Expiration Date:   05/15/2020  . Iron and TIBC    Standing Status:   Future    Number of Occurrences:   1    Standing Expiration Date:   05/15/2020  . Ferritin    Standing Status:   Future    Number of Occurrences:   1    Standing Expiration Date:   05/15/2020  . Urinalysis, Complete w Microscopic    All questions were answered. The patient knows to call the clinic with any problems questions or concerns.  Cc Virgel Manifold, MD  Return of visit: 1-2 weeks. Thank you for this kind referral and the opportunity to participate in the care of this patient. A copy of today's note is routed to referring provider    Earlie Server, MD, PhD Hematology Oncology [facility] Pager- [phone] 05/16/2019

## 2019-05-19 ENCOUNTER — Emergency Department: Payer: Medicaid Other

## 2019-05-19 ENCOUNTER — Other Ambulatory Visit: Payer: Self-pay

## 2019-05-19 ENCOUNTER — Emergency Department
Admission: EM | Admit: 2019-05-19 | Discharge: 2019-05-19 | Disposition: A | Discharge status: Home / Self Care | Payer: Medicaid Other | Attending: Emergency Medicine | Admitting: Emergency Medicine

## 2019-05-19 DIAGNOSIS — Z7982 Long term (current) use of aspirin: Secondary | ICD-10-CM | POA: Diagnosis not present

## 2019-05-19 DIAGNOSIS — W19XXXA Unspecified fall, initial encounter: Secondary | ICD-10-CM | POA: Diagnosis not present

## 2019-05-19 DIAGNOSIS — Z87891 Personal history of nicotine dependence: Secondary | ICD-10-CM | POA: Insufficient documentation

## 2019-05-19 DIAGNOSIS — Y999 Unspecified external cause status: Secondary | ICD-10-CM | POA: Insufficient documentation

## 2019-05-19 DIAGNOSIS — Z79899 Other long term (current) drug therapy: Secondary | ICD-10-CM | POA: Insufficient documentation

## 2019-05-19 DIAGNOSIS — I10 Essential (primary) hypertension: Secondary | ICD-10-CM | POA: Diagnosis not present

## 2019-05-19 DIAGNOSIS — E119 Type 2 diabetes mellitus without complications: Secondary | ICD-10-CM | POA: Insufficient documentation

## 2019-05-19 DIAGNOSIS — Y939 Activity, unspecified: Secondary | ICD-10-CM | POA: Diagnosis not present

## 2019-05-19 DIAGNOSIS — Y929 Unspecified place or not applicable: Secondary | ICD-10-CM | POA: Insufficient documentation

## 2019-05-19 DIAGNOSIS — J449 Chronic obstructive pulmonary disease, unspecified: Secondary | ICD-10-CM | POA: Diagnosis not present

## 2019-05-19 DIAGNOSIS — S6991XA Unspecified injury of right wrist, hand and finger(s), initial encounter: Secondary | ICD-10-CM | POA: Diagnosis present

## 2019-05-19 DIAGNOSIS — S52501A Unspecified fracture of the lower end of right radius, initial encounter for closed fracture: Secondary | ICD-10-CM | POA: Diagnosis not present

## 2019-05-19 DIAGNOSIS — S52591A Other fractures of lower end of right radius, initial encounter for closed fracture: Secondary | ICD-10-CM

## 2019-05-19 MED ORDER — HYDROCODONE-ACETAMINOPHEN 5-325 MG PO TABS: 1.0000 | ORAL_TABLET | ORAL | 0 refills | Status: DC | PRN

## 2019-05-19 NOTE — Discharge Instructions (Addendum)
Your x-rays show a small avulsion fracture along the tip of the styloid of your right wrist.  This is a stable fracture and needs splinting just for comfort.  Would recommend taking Tylenol for mild pain and Norco as needed for moderate to severe pain.  Use of brace for 2 to 3 weeks as needed for comfort then you may transition out.  If continued pain in the next 2 weeks would recommend follow-up with orthopedics.  Return to the ER for any worsening symptoms or urgent changes in your health.

## 2019-05-19 NOTE — ED Triage Notes (Signed)
Pt presents via POV c/o fall this am with right wrist pain. Denies LOC.

## 2019-05-19 NOTE — ED Provider Notes (Signed)
[facility] EMERGENCY DEPARTMENT Provider Note   CSN: [id number] Arrival date & time: 05/19/19  1551     History Chief Complaint  Patient presents with  . Wrist Pain    Sylvia [name] is a 64 y.o. female presents to the emergency department for evaluation of right wrist pain.  She had a fall this morning in which she suffered a mechanical fall onto her right hand.  She denies any other injury to her body.  She has pain along the radial aspect of the wrist with mild swelling.  She recently had lower extremity amputation and has had a hard time regaining mobility secondary to loss of balance.  She denies hitting her head or losing consciousness.  No back pain or lower extremity discomfort.  Pain is moderate along the right wrist.  HPI     Past Medical History:  Diagnosis Date  . Anemia of chronic disease 05/16/2019  . Anxiety    h/o  . COPD (chronic obstructive pulmonary disease) (HCC)   . Diabetes mellitus without complication (HCC)   . GERD (gastroesophageal reflux disease)   . Hypertension    bp under control-off meds since 2019    Patient Active Problem List   Diagnosis Date Noted  . Anemia of chronic disease 05/16/2019  . Dizziness   . Polyp of colon   . Acute upper GI bleeding 04/13/2019  . Anemia associated with acute blood loss 04/13/2019  . Chronic anticoagulation 04/13/2019  . 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 Symptomatic anemia 04/13/2019  . Upper GI bleed 04/13/2019  . Acute GI bleeding 04/13/2019  . 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 Melena   . DU (duodenal ulcer)   . DKA (diabetic ketoacidoses) (HCC) 12/14/2018  . Atherosclerosis of artery of extremity with rest pain (HCC) 08/28/2018  . PAD (peripheral artery disease) (HCC) 07/14/2018  . Abdominal pain 06/13/2018  . HTN (hypertension) 06/13/2018  . Non-insulin dependent type 2 diabetes mellitus (HCC) 06/13/2018  . Elevated lactic acid level  06/13/2018    Past Surgical History:  Procedure Laterality Date  . ABDOMINAL HYSTERECTOMY    . AMPUTATION Right 12/27/2018   Procedure: AMPUTATION BELOW KNEE;  Surgeon: Annice Needy, MD;  Location: [facility];  Service: General;  Laterality: Right;  . CHOLECYSTECTOMY    . COLONOSCOPY WITH PROPOFOL N/A 04/16/2019   Procedure: COLONOSCOPY WITH PROPOFOL;  Surgeon: Pasty Spillers, MD;  Location: [facility];  Service: Endoscopy;  Laterality: N/A;  . ESOPHAGOGASTRODUODENOSCOPY (EGD) WITH PROPOFOL N/A 06/15/2018   Procedure: ESOPHAGOGASTRODUODENOSCOPY (EGD) WITH PROPOFOL;  Surgeon: Toledo, Boykin Nearing, MD;  Location: [facility];  Service: Gastroenterology;  Laterality: N/A;  . ESOPHAGOGASTRODUODENOSCOPY (EGD) WITH PROPOFOL N/A 01/03/2019   Procedure: ESOPHAGOGASTRODUODENOSCOPY (EGD) WITH PROPOFOL;  Surgeon: Midge Minium, MD;  Location: [facility];  Service: Endoscopy;  Laterality: N/A;  . GIVENS CAPSULE STUDY  04/16/2019   Procedure: GIVENS CAPSULE STUDY;  Surgeon: Pasty Spillers, MD;  Location: [facility];  Service: Endoscopy;;  . LOWER EXTREMITY ANGIOGRAPHY Right 08/21/2018   Procedure: LOWER EXTREMITY ANGIOGRAPHY;  Surgeon: Annice Needy, MD;  Location: [facility];  Service: Cardiovascular;  Laterality: Right;  . LOWER EXTREMITY ANGIOGRAPHY Left 08/28/2018   Procedure: LOWER EXTREMITY ANGIOGRAPHY;  Surgeon: Annice Needy, MD;  Location: [facility];  Service: Cardiovascular;  Laterality: Left;  . LOWER EXTREMITY ANGIOGRAPHY Right 08/28/2018   Procedure: Lower Extremity Angiography;  Surgeon: Annice Needy, MD;  Location: [facility];  Service: Cardiovascular;  Laterality: Right;  . LOWER EXTREMITY ANGIOGRAPHY Right 12/18/2018   Procedure: Lower Extremity Angiography;  Surgeon: Annice Needy, MD;  Location: [facility];  Service: Cardiovascular;  Laterality: Right;  . LOWER EXTREMITY ANGIOGRAPHY Right 12/18/2018   Procedure: Lower Extremity  Angiography;  Surgeon: Annice Needy, MD;  Location: [facility];  Service: Cardiovascular;  Laterality: Right;  . LOWER EXTREMITY ANGIOGRAPHY Left 12/21/2018   Procedure: Lower Extremity Angiography;  Surgeon: Annice Needy, MD;  Location: [facility];  Service: Cardiovascular;  Laterality: Left;  . LOWER EXTREMITY ANGIOGRAPHY Right 12/21/2018   Procedure: Lower Extremity Angiography;  Surgeon: Annice Needy, MD;  Location: [facility];  Service: Cardiovascular;  Laterality: Right;  . LOWER EXTREMITY INTERVENTION N/A 12/22/2018   Procedure: LOWER EXTREMITY INTERVENTION;  Surgeon: Annice Needy, MD;  Location: [facility];  Service: Cardiovascular;  Laterality: N/A;     OB History   No obstetric history on file.     Family History  Problem Relation Age of Onset  . Breast cancer Mother 63    Social History   Tobacco Use  . Smoking status: Former Smoker    Packs/day: 0.25    Years: 45.00    Pack years: 11.25    Types: Cigarettes    Quit date: 08/28/2018    Years since quitting: 0.7  . Smokeless tobacco: Never Used  . Tobacco comment: quit  Substance Use Topics  . Alcohol use: No  . Drug use: Never    Home Medications Prior to Admission medications   Medication Sig Start Date End Date Taking? Authorizing Provider  acetaminophen (TYLENOL) 325 MG tablet Take 650 mg by mouth every 6 (six) hours as needed.    [provider]  albuterol (PROVENTIL HFA;VENTOLIN HFA) 108 (90 Base) MCG/ACT inhaler Inhale 2 puffs into the lungs every 6 (six) hours as needed for wheezing or shortness of breath. 08/20/17   Jeanmarie Plant, MD  apixaban (ELIQUIS) 2.5 MG TABS tablet Take 1 tablet (2.5 mg total) by mouth 2 (two) times daily. 01/30/19   Georgiana Spinner, NP  aspirin 81 MG EC tablet Take 1 tablet (81 mg total) by mouth daily. 01/30/19   Georgiana Spinner, NP  atorvastatin (LIPITOR) 10 MG tablet Take 10 mg by mouth every morning.    [provider]   cetirizine (ZYRTEC) 10 MG tablet Take 10 mg by mouth daily.    [provider]  cholecalciferol (VITAMIN D3) 25 MCG (1000 UT) tablet Take 1,000 Units by mouth daily.    [provider]  clopidogrel (PLAVIX) 75 MG tablet Take 75 mg by mouth daily.    [provider]  docusate sodium (COLACE) 100 MG capsule Take 100 mg by mouth 2 (two) times daily.    [provider]  DULoxetine (CYMBALTA) 30 MG capsule Take 30 mg by mouth daily.    [provider]  gabapentin (NEURONTIN) 300 MG capsule Take 1 capsule (300 mg total) by mouth at bedtime. 01/30/19   Georgiana Spinner, NP  HYDROcodone-acetaminophen (NORCO) 5-325 MG tablet Take 1 tablet by mouth every 4 (four) hours as needed for moderate pain. 05/19/19   Evon Slack, PA-C  iron polysaccharides (NIFEREX) 150 MG capsule Take 1 capsule (150 mg total) by mouth daily. 02/07/19   Georgiana Spinner, NP  lisinopril (ZESTRIL) 5 MG tablet Take 1 tablet (5 mg total)  by mouth daily. 01/30/19   Kris Hartmann, NP  metoprolol tartrate (LOPRESSOR) 50 MG tablet Take 1 tablet (50 mg total) by mouth 2 (two) times daily. 01/30/19   Kris Hartmann, NP  Multiple Vitamins-Minerals (WOMENS MULTIVITAMIN PO) Take 1 tablet by mouth daily.    [provider]  pantoprazole (PROTONIX) 40 MG tablet Take 1 tablet (40 mg total) by mouth daily. Patient taking differently: Take 40 mg by mouth daily. Fraser Din reports taking twice daily. 06/17/18   Stark Jock, Jude, MD  sitaGLIPtin (JANUVIA) 100 MG tablet Take 100 mg by mouth daily.    [provider]  vitamin B-12 (CYANOCOBALAMIN) 100 MCG tablet Take 100 mcg by mouth daily.    [provider]  vitamin C (ASCORBIC ACID) 500 MG tablet Take 500 mg by mouth daily.    [provider]    Allergies    Penicillins and Tramadol  Review of Systems   Review of Systems  Constitutional: Negative for fever.  Musculoskeletal: Positive for arthralgias. Negative for joint  swelling and myalgias.  Skin: Negative for rash and wound.  Neurological: Negative for dizziness, light-headedness and headaches.    Physical Exam Updated Vital Signs BP (!) 146/72 (BP Location: Left Arm)   Pulse 87   Temp 98.7 F (37.1 C) (Oral)   Resp 18   SpO2 99%   Physical Exam Constitutional:      Appearance: She is well-developed.  HENT:     Head: Normocephalic and atraumatic.  Eyes:     Conjunctiva/sclera: Conjunctivae normal.  Cardiovascular:     Rate and Rhythm: Normal rate.  Pulmonary:     Effort: Pulmonary effort is normal. No respiratory distress.  Musculoskeletal:     Cervical back: Normal range of motion.     Comments: Right wrist shows no swelling warmth erythema.  No skin breakdown noted.  Full composite fist.  Tenderness on the radial styloid.  Sensation intact distally.  Skin:    General: Skin is warm.     Findings: No rash.  Neurological:     Mental Status: She is alert and oriented to person, place, and time.  Psychiatric:        Behavior: Behavior normal.        Thought Content: Thought content normal.     ED Results / Procedures / Treatments   Labs (all labs ordered are listed, but only abnormal results are displayed) Labs Reviewed - No data to display  EKG None  Radiology DG Wrist Complete Right  Result Date: 05/19/2019 CLINICAL DATA:  Fall yesterday. Right wrist pain. Initial encounter. EXAM: RIGHT WRIST - COMPLETE 3+ VIEW COMPARISON:  None. FINDINGS: A tiny ossific density is seen adjacent to the radial styloid process, suspicious for a tiny avulsion fracture fragment. No other fractures or bone lesions identified. No evidence of arthropathy. IMPRESSION: Suspect tiny avulsion fracture fragment adjacent to the radial styloid process. Recommend clinical correlation for point tenderness at this site. Electronically Signed   By: Marlaine Hind M.D.   On: 05/19/2019 17:14    Procedures Procedures (including critical care time)  Medications  Ordered in ED Medications - No data to display  ED Course  I have reviewed the triage vital signs and the nursing notes.  Pertinent labs & imaging results that were available during my care of the patient were reviewed by me and considered in my medical decision making (see chart for details).    MDM Rules/Calculators/A&P  64 year old female with tiny avulsion fracture along the radial styloid.  No intra-articular extension.  No significant swelling there deformity on exam.  No tendon deficits.  She is placed into a Velcro wrist brace.  She will wear her brace as needed for comfort for 2 to 3 weeks and follow-up with orthopedics if no improvement.  She is given Norco as needed for moderate to severe pain.  She will take Tylenol as needed for mild pain.  She understands signs and symptoms to return to ED for. Final Clinical Impression(s) / ED Diagnoses Final diagnoses:  Other closed fracture of distal end of right radius, initial encounter    Rx / DC Orders ED Discharge Orders         Ordered    HYDROcodone-acetaminophen (NORCO) 5-325 MG tablet  Every 4 hours PRN     05/19/19 1753           Evon Slack, PA-C 05/19/19 1802    Minna Antis, MD 05/19/19 2049

## 2019-05-22 ENCOUNTER — Inpatient Hospital Stay: Payer: Medicaid Other | Attending: Oncology

## 2019-05-22 ENCOUNTER — Other Ambulatory Visit: Payer: Self-pay

## 2019-05-22 DIAGNOSIS — Z803 Family history of malignant neoplasm of breast: Secondary | ICD-10-CM | POA: Diagnosis not present

## 2019-05-22 DIAGNOSIS — Z79899 Other long term (current) drug therapy: Secondary | ICD-10-CM | POA: Insufficient documentation

## 2019-05-22 DIAGNOSIS — Z7984 Long term (current) use of oral hypoglycemic drugs: Secondary | ICD-10-CM | POA: Diagnosis not present

## 2019-05-22 DIAGNOSIS — I10 Essential (primary) hypertension: Secondary | ICD-10-CM | POA: Diagnosis not present

## 2019-05-22 DIAGNOSIS — Z7982 Long term (current) use of aspirin: Secondary | ICD-10-CM | POA: Insufficient documentation

## 2019-05-22 DIAGNOSIS — Z89511 Acquired absence of right leg below knee: Secondary | ICD-10-CM | POA: Diagnosis not present

## 2019-05-22 DIAGNOSIS — Z87891 Personal history of nicotine dependence: Secondary | ICD-10-CM | POA: Diagnosis not present

## 2019-05-22 DIAGNOSIS — D649 Anemia, unspecified: Secondary | ICD-10-CM

## 2019-05-22 DIAGNOSIS — E119 Type 2 diabetes mellitus without complications: Secondary | ICD-10-CM | POA: Insufficient documentation

## 2019-05-22 DIAGNOSIS — Z7901 Long term (current) use of anticoagulants: Secondary | ICD-10-CM | POA: Diagnosis not present

## 2019-05-22 DIAGNOSIS — D509 Iron deficiency anemia, unspecified: Secondary | ICD-10-CM | POA: Diagnosis not present

## 2019-05-22 DIAGNOSIS — J449 Chronic obstructive pulmonary disease, unspecified: Secondary | ICD-10-CM | POA: Insufficient documentation

## 2019-05-22 DIAGNOSIS — F419 Anxiety disorder, unspecified: Secondary | ICD-10-CM | POA: Insufficient documentation

## 2019-05-22 LAB — C-REACTIVE PROTEIN: CRP: 0.9 mg/dL (ref <1.0)

## 2019-05-22 LAB — SEDIMENTATION RATE: Sed Rate: 68 mm/hr — ABNORMAL HIGH (ref 0–30)

## 2019-05-23 ENCOUNTER — Ambulatory Visit: Payer: Medicaid Other | Admitting: Gastroenterology

## 2019-05-23 LAB — KAPPA/LAMBDA LIGHT CHAINS
Kappa free light chain: 28.1 mg/L — ABNORMAL HIGH (ref 3.3–19.4)
Kappa, lambda light chain ratio: 1.14 (ref 0.26–1.65)
Lambda free light chains: 24.6 mg/L (ref 5.7–26.3)

## 2019-05-23 LAB — ANA W/REFLEX: Anti Nuclear Antibody (ANA): NEGATIVE

## 2019-05-24 LAB — MULTIPLE MYELOMA PANEL, SERUM
Albumin SerPl Elph-Mcnc: 3.8 g/dL (ref 2.9–4.4)
Albumin/Glob SerPl: 1.5 (ref 0.7–1.7)
Alpha 1: 0.2 g/dL (ref 0.0–0.4)
Alpha2 Glob SerPl Elph-Mcnc: 0.6 g/dL (ref 0.4–1.0)
B-Globulin SerPl Elph-Mcnc: 1.1 g/dL (ref 0.7–1.3)
Gamma Glob SerPl Elph-Mcnc: 0.7 g/dL (ref 0.4–1.8)
Globulin, Total: 2.6 g/dL (ref 2.2–3.9)
IgA: 326 mg/dL (ref 87–352)
IgG (Immunoglobin G), Serum: 910 mg/dL (ref 586–1602)
IgM (Immunoglobulin M), Srm: 105 mg/dL (ref 26–217)
Total Protein ELP: 6.4 g/dL (ref 6.0–8.5)

## 2019-05-29 ENCOUNTER — Inpatient Hospital Stay: Payer: Medicaid Other

## 2019-05-29 ENCOUNTER — Other Ambulatory Visit: Payer: Self-pay

## 2019-05-29 ENCOUNTER — Inpatient Hospital Stay (HOSPITAL_BASED_OUTPATIENT_CLINIC_OR_DEPARTMENT_OTHER): Payer: Medicaid Other | Admitting: Oncology

## 2019-05-29 ENCOUNTER — Encounter: Payer: Self-pay | Admitting: Oncology

## 2019-05-29 VITALS — BP 144/77 | HR 82 | Temp 97.2°F | Wt 136.6 lb

## 2019-05-29 DIAGNOSIS — D509 Iron deficiency anemia, unspecified: Secondary | ICD-10-CM | POA: Diagnosis not present

## 2019-05-29 DIAGNOSIS — D638 Anemia in other chronic diseases classified elsewhere: Secondary | ICD-10-CM

## 2019-05-29 DIAGNOSIS — Z8719 Personal history of other diseases of the digestive system: Secondary | ICD-10-CM

## 2019-05-29 DIAGNOSIS — D649 Anemia, unspecified: Secondary | ICD-10-CM

## 2019-05-29 MED ORDER — IRON SUCROSE 20 MG/ML IV SOLN
200.0000 mg | Freq: Once | INTRAVENOUS | Status: AC
  Administered 2019-05-29: 200 mg via INTRAVENOUS
  Filled 2019-05-29: qty 10

## 2019-05-29 MED ORDER — SODIUM CHLORIDE 0.9 % IV SOLN
Freq: Once | INTRAVENOUS | Status: AC
  Filled 2019-05-29: qty 250

## 2019-05-29 NOTE — Progress Notes (Signed)
Patient does not offer any problems today.  

## 2019-05-29 NOTE — Progress Notes (Signed)
Hematology/Oncology Consult note [facility] [phone] [phone]   Patient Care Team: [facility], [facility] as PCP - General (General Practice) Earlie Server, MD as Consulting Physician (Hematology and Oncology)  REFERRING PROVIDER: [facility], [place] COMPLAINTS/REASON FOR VISIT:  Evaluation of iron deficiency anemia  HISTORY OF PRESENTING ILLNESS:  Sylvia [name] is a  64 y.o.  female with PMH listed below was seen in consultation at the request of Center, [place]*   for evaluation of iron deficiency anemia.   Reviewed patient's recent labs  05/09/2019, hemoglobin 8.1, MCV 88, platelet 470. 04/16/2019, iron panel showed saturation 18, ferritin 28, TIBC 308 Patient has been started on oral iron supplementation NiFerrex 150 mg daily.  She reports tolerating well.  Reviewed patient's previous labs ordered by primary care physician's office, anemia is chronic onset , duration is since at least July 2017.  Patient's hemoglobin was about 9 back in September 2020.  She was admitted at that time due to severe PAD and right foot gangrene.  Status post right below-knee amputation.   She was admitted in December due to acute drop of hemoglobin to 6.  She has a history of GI bleed secondary to gastric ulcer.  Received PRBC transfusion.  Patient is on Eliquis 2.5 mg twice daily. No aggravating or improving factors.  Associated signs and symptoms: Patient reports fatigue.  Denies SOB with exertion.  Denies  easy bruising, hematochezia, hemoptysis, hematuria.  She has lost 15 pounds of weight recently. Context:  History of iron deficiency:  Rectal bleeding: Positive for melena.  No recent rectal bleeding. Menstrual bleeding/ Vaginal bleeding : Denies Hematemesis or hemoptysis : denies Blood in urine : denies  Last endoscopy: 04/13/2019 EGD colonoscopy and small bowel capsule study during her hospitalization.  EGD was  negative, 04/16/2019 colonoscopy with small polyps removed, 04/18/2019 small bowel capsule study did not reach this small bowel and was inconclusive.  Fatigue: Yes.  SOB: deneis  INTERVAL HISTORY Sylvia [name] is a 64 y.o. female who has above history reviewed by me today presents for follow up visit for management of anemia Problems and complaints are listed below: Patient was accompanied by her father.  She has no new complaints.  Continues to feel fatigued. She had blood work done as well.  Present to discuss blood report and management plan.  Review of Systems  Constitutional: Positive for fatigue. Negative for appetite change, chills, fever and unexpected weight change.  HENT:   Negative for hearing loss and voice change.   Eyes: Negative for eye problems.  Respiratory: Negative for chest tightness and cough.   Cardiovascular: Negative for chest pain.  Gastrointestinal: Negative for abdominal distention, abdominal pain and blood in stool.  Endocrine: Negative for hot flashes.  Genitourinary: Negative for difficulty urinating and frequency.   Musculoskeletal: Negative for arthralgias.       Right wrist pain  Skin: Negative for itching and rash.  Neurological: Negative for extremity weakness.  Hematological: Negative for adenopathy.  Psychiatric/Behavioral: Negative for confusion.    MEDICAL HISTORY:  Past Medical History:  Diagnosis Date  . Anemia of chronic disease 05/16/2019  . Anxiety    h/o  . COPD (chronic obstructive pulmonary disease) ([place])   . Diabetes mellitus without complication ([place])   . GERD (gastroesophageal reflux disease)   . Hypertension    bp under control-off meds since 2019    SURGICAL HISTORY: Past Surgical History:  Procedure Laterality Date  . ABDOMINAL HYSTERECTOMY    .  AMPUTATION Right 12/27/2018   Procedure: AMPUTATION BELOW KNEE;  Surgeon: Algernon Huxley, MD;  Location: [facility];  Service: General;  Laterality: Right;  . CHOLECYSTECTOMY     . COLONOSCOPY WITH PROPOFOL N/A 04/16/2019   Procedure: COLONOSCOPY WITH PROPOFOL;  Surgeon: Virgel Manifold, MD;  Location: [facility];  Service: Endoscopy;  Laterality: N/A;  . ESOPHAGOGASTRODUODENOSCOPY (EGD) WITH PROPOFOL N/A 06/15/2018   Procedure: ESOPHAGOGASTRODUODENOSCOPY (EGD) WITH PROPOFOL;  Surgeon: [place], Benay Pike, MD;  Location: [facility];  Service: Gastroenterology;  Laterality: N/A;  . ESOPHAGOGASTRODUODENOSCOPY (EGD) WITH PROPOFOL N/A 01/03/2019   Procedure: ESOPHAGOGASTRODUODENOSCOPY (EGD) WITH PROPOFOL;  Surgeon: Lucilla Lame, MD;  Location: [facility];  Service: Endoscopy;  Laterality: N/A;  . GIVENS CAPSULE STUDY  04/16/2019   Procedure: GIVENS CAPSULE STUDY;  Surgeon: Virgel Manifold, MD;  Location: [facility];  Service: Endoscopy;;  . LOWER EXTREMITY ANGIOGRAPHY Right 08/21/2018   Procedure: LOWER EXTREMITY ANGIOGRAPHY;  Surgeon: Algernon Huxley, MD;  Location: [place] [facility];  Service: Cardiovascular;  Laterality: Right;  . LOWER EXTREMITY ANGIOGRAPHY Left 08/28/2018   Procedure: LOWER EXTREMITY ANGIOGRAPHY;  Surgeon: Algernon Huxley, MD;  Location: [place] [facility];  Service: Cardiovascular;  Laterality: Left;  . LOWER EXTREMITY ANGIOGRAPHY Right 08/28/2018   Procedure: Lower Extremity Angiography;  Surgeon: Algernon Huxley, MD;  Location: [place] [facility];  Service: Cardiovascular;  Laterality: Right;  . LOWER EXTREMITY ANGIOGRAPHY Right 12/18/2018   Procedure: Lower Extremity Angiography;  Surgeon: Algernon Huxley, MD;  Location: [place] [facility];  Service: Cardiovascular;  Laterality: Right;  . LOWER EXTREMITY ANGIOGRAPHY Right 12/18/2018   Procedure: Lower Extremity Angiography;  Surgeon: Algernon Huxley, MD;  Location: [place] [facility];  Service: Cardiovascular;  Laterality: Right;  . LOWER EXTREMITY ANGIOGRAPHY Left 12/21/2018   Procedure: Lower Extremity Angiography;  Surgeon: Algernon Huxley, MD;  Location: [place] [facility];  Service:  Cardiovascular;  Laterality: Left;  . LOWER EXTREMITY ANGIOGRAPHY Right 12/21/2018   Procedure: Lower Extremity Angiography;  Surgeon: Algernon Huxley, MD;  Location: [place] [facility];  Service: Cardiovascular;  Laterality: Right;  . LOWER EXTREMITY INTERVENTION N/A 12/22/2018   Procedure: LOWER EXTREMITY INTERVENTION;  Surgeon: Algernon Huxley, MD;  Location: [place] [facility];  Service: Cardiovascular;  Laterality: N/A;    SOCIAL HISTORY: Social History   Socioeconomic History  . Marital status: Divorced    Spouse name: Not on file  . Number of children: Not on file  . Years of education: Not on file  . Highest education level: Not on file  Occupational History  . Not on file  Tobacco Use  . Smoking status: Former Smoker    Packs/day: 0.25    Years: 45.00    Pack years: 11.25    Types: Cigarettes    Quit date: 08/28/2018    Years since quitting: 0.7  . Smokeless tobacco: Never Used  . Tobacco comment: quit  Substance and Sexual Activity  . Alcohol use: No  . Drug use: Never  . Sexual activity: Not Currently  Other Topics Concern  . Not on file  Social History Narrative  . Not on file   Social Determinants of Health   Financial Resource Strain: Low Risk   . Difficulty of Paying Living Expenses: Not hard at all  Food Insecurity: Food Insecurity Present  . Worried About Charity fundraiser in the Last Year: Sometimes true  . Ran Out of Food in the Last Year:  Sometimes true  Transportation Needs: No Transportation Needs  . Lack of Transportation (Medical): No  . Lack of Transportation (Non-Medical): No  Physical Activity: Insufficiently Active  . Days of Exercise per Week: 4 days  . Minutes of Exercise per Session: 20 min  Stress: No Stress Concern Present  . Feeling of Stress : Only a little  Social Connections: Unknown  . Frequency of Communication with Friends and Family: Patient refused  . Frequency of Social Gatherings with Friends and Family: Patient refused  .  Attends Religious Services: Patient refused  . Active Member of Clubs or Organizations: Patient refused  . Attends Archivist Meetings: Patient refused  . Marital Status: Patient refused  Intimate Partner Violence: Unknown  . Fear of Current or Ex-Partner: Patient refused  . Emotionally Abused: Patient refused  . Physically Abused: Patient refused  . Sexually Abused: Patient refused    FAMILY HISTORY: Family History  Problem Relation Age of Onset  . Breast cancer Mother 84    ALLERGIES:  is allergic to penicillins and tramadol.  MEDICATIONS:  Current Outpatient Medications  Medication Sig Dispense Refill  . acetaminophen (TYLENOL) 325 MG tablet Take 650 mg by mouth every 6 (six) hours as needed.    Marland Kitchen albuterol (PROVENTIL HFA;VENTOLIN HFA) 108 (90 Base) MCG/ACT inhaler Inhale 2 puffs into the lungs every 6 (six) hours as needed for wheezing or shortness of breath. 1 Inhaler 2  . apixaban (ELIQUIS) 2.5 MG TABS tablet Take 1 tablet (2.5 mg total) by mouth 2 (two) times daily. 60 tablet 3  . aspirin 81 MG EC tablet Take 1 tablet (81 mg total) by mouth daily. 30 tablet 3  . atorvastatin (LIPITOR) 10 MG tablet Take 10 mg by mouth every morning.    . cetirizine (ZYRTEC) 10 MG tablet Take 10 mg by mouth daily.    . clopidogrel (PLAVIX) 75 MG tablet Take 75 mg by mouth daily.    Marland Kitchen docusate sodium (COLACE) 100 MG capsule Take 100 mg by mouth 2 (two) times daily.    . DULoxetine (CYMBALTA) 30 MG capsule Take 30 mg by mouth daily.    . ferrous sulfate 324 MG TBEC Take 324 mg by mouth daily.    Marland Kitchen gabapentin (NEURONTIN) 300 MG capsule Take 1 capsule (300 mg total) by mouth at bedtime. 30 capsule 2  . HYDROcodone-acetaminophen (NORCO) 5-325 MG tablet Take 1 tablet by mouth every 4 (four) hours as needed for moderate pain. 10 tablet 0  . lisinopril (ZESTRIL) 5 MG tablet Take 1 tablet (5 mg total) by mouth daily. 30 tablet 0  . Melatonin 1 MG TABS Take 1 tablet by mouth at bedtime.    .  metoprolol tartrate (LOPRESSOR) 50 MG tablet Take 1 tablet (50 mg total) by mouth 2 (two) times daily. 60 tablet 0  . Multiple Vitamins-Minerals (WOMENS MULTIVITAMIN PO) Take 1 tablet by mouth daily.    . pantoprazole (PROTONIX) 40 MG tablet Take 1 tablet (40 mg total) by mouth daily. (Patient taking differently: Take 40 mg by mouth daily. Fraser Din reports taking twice daily.) 30 tablet 0  . sitaGLIPtin (JANUVIA) 100 MG tablet Take 100 mg by mouth daily.    . vitamin C (ASCORBIC ACID) 500 MG tablet Take 500 mg by mouth daily.    . cholecalciferol (VITAMIN D3) 25 MCG (1000 UT) tablet Take 1,000 Units by mouth daily.    . vitamin B-12 (CYANOCOBALAMIN) 100 MCG tablet Take 100 mcg by mouth daily.  No current facility-administered medications for this visit.     PHYSICAL EXAMINATION: ECOG PERFORMANCE STATUS: 1 - Symptomatic but completely ambulatory Vitals:   05/29/19 1340  BP: (!) 144/77  Pulse: 82  Temp: (!) 97.2 F (36.2 C)   Filed Weights   05/29/19 1340  Weight: 136 lb 9.6 oz (62 kg)    Physical Exam Constitutional:      General: She is not in acute distress.    Comments: Patient sits in the wheelchair.  HENT:     Head: Normocephalic and atraumatic.  Eyes:     General: No scleral icterus. Cardiovascular:     Rate and Rhythm: Normal rate and regular rhythm.     Heart sounds: Normal heart sounds.  Pulmonary:     Effort: Pulmonary effort is normal. No respiratory distress.     Breath sounds: No wheezing.  Abdominal:     General: Bowel sounds are normal. There is no distension.     Palpations: Abdomen is soft.  Musculoskeletal:        General: No deformity. Normal range of motion.     Cervical back: Normal range of motion and neck supple.     Comments: Right lower extremity below-knee amputation, stump is well-healed.  No discharge.  Skin:    General: Skin is warm and dry.     Findings: No erythema or rash.  Neurological:     Mental Status: She is alert and oriented to  person, place, and time. Mental status is at baseline.     Cranial Nerves: No cranial nerve deficit.     Coordination: Coordination normal.  Psychiatric:        Mood and Affect: Mood normal.       CMP Latest Ref Rng & Units 05/16/2019  Glucose 70 - 99 mg/dL 230(H)  BUN 8 - 23 mg/dL 11  Creatinine 0.44 - 1.00 mg/dL 0.70  Sodium 135 - 145 mmol/L 138  Potassium 3.5 - 5.1 mmol/L 3.5  Chloride 98 - 111 mmol/L 105  CO2 22 - 32 mmol/L 25  Calcium 8.9 - 10.3 mg/dL 9.6  Total Protein 6.5 - 8.1 g/dL 7.6  Total Bilirubin 0.3 - 1.2 mg/dL 0.7  Alkaline Phos 38 - 126 U/L 91  AST 15 - 41 U/L 18  ALT 0 - 44 U/L 17   CBC Latest Ref Rng & Units 05/16/2019  WBC 4.0 - 10.5 K/uL 7.2  Hemoglobin 12.0 - 15.0 g/dL 7.8(L)  Hematocrit 36.0 - 46.0 % 24.0(L)  Platelets 150 - 400 K/uL 381     LABORATORY DATA:  I have reviewed the data as listed Lab Results  Component Value Date   WBC 7.2 05/16/2019   HGB 7.8 (L) 05/16/2019   HCT 24.0 (L) 05/16/2019   MCV 92.0 05/16/2019   PLT 381 05/16/2019   Recent Labs    06/13/18 1313 06/14/18 0508 12/14/18 1240 12/14/18 2237 12/25/18 0806 12/26/18 0328 04/15/19 0415 04/17/19 0449 05/16/19 1142  NA 139   < > 141   < > 141   < > 142 139 138  K 3.8   < > 3.8   < > 4.4   < > 3.1* 3.0* 3.5  CL 107   < > 104   < > 111   < > 110 108 105  CO2 22   < > 20*   < > 20*   < > 23 21* 25  GLUCOSE 288*   < > 291*   < > 104*   < >  145* 172* 230*  BUN 11   < > 14   < > 28*   < > 10 7* 11  CREATININE 0.80   < > 1.04*   < > 1.62*   < > 0.72 0.63 0.70  CALCIUM 10.0   < > 10.8*   < > 8.6*   < > 9.1 9.0 9.6  GFRNONAA >60   < > 57*   < > 33*   < > >60 >60 >60  GFRAA >60   < > >60   < > 38*   < > >60 >60 >60  PROT 8.1  --  8.4*  --  6.6  --   --   --  7.6  ALBUMIN 4.4  --  4.3  --  2.6*  --   --   --  3.9  AST 15  --  17  --  117*  --   --   --  18  ALT 13  --  15  --  56*  --   --   --  17  ALKPHOS 94  --  86  --  104  --   --   --  91  BILITOT 1.0  --  1.2  --   0.8  --   --   --  0.7  BILIDIR 0.1  --  0.1  --   --   --   --   --   --   IBILI 0.9  --  1.1*  --   --   --   --   --   --    < > = values in this interval not displayed.   Iron/TIBC/Ferritin/ %Sat    Component Value Date/Time   IRON 238 (H) 05/16/2019 1142   TIBC 379 05/16/2019 1142   FERRITIN 40 05/16/2019 1142   IRONPCTSAT 63 (H) 05/16/2019 1142     DG Wrist Complete Right  Result Date: 05/19/2019 CLINICAL DATA:  Fall yesterday. Right wrist pain. Initial encounter. EXAM: RIGHT WRIST - COMPLETE 3+ VIEW COMPARISON:  None. FINDINGS: A tiny ossific density is seen adjacent to the radial styloid process, suspicious for a tiny avulsion fracture fragment. No other fractures or bone lesions identified. No evidence of arthropathy. IMPRESSION: Suspect tiny avulsion fracture fragment adjacent to the radial styloid process. Recommend clinical correlation for point tenderness at this site. Electronically Signed   By: Marlaine Hind M.D.   On: 05/19/2019 17:14      ASSESSMENT & PLAN:  1. Normocytic anemia   2. History of gastrointestinal bleeding   3. Anemia of chronic disease    Anemia: multifactorial hemoglobin 7.8. Labs reviewed and discussed with patient.  Multiple myeloma panel was negative. Iron panel on 05/16/2019 showed ferritin of 40, increased iron saturation may be falsely elevated due to increase of serum iron.  Normal LDH, normal bilirubin, less likely hemolysis.  Normal TSH.  Urinalysis showed no RBC Patient has positive ESR, iron panel may be masked by chronic inflammation.  I would recommend further increase iron with IV Venofer 200 mg x 1 and follow along her hematological response. Patient agrees with the plan.  We have discussed about the rationale and side effects of Venofer treatments at the last visit.  She agrees with the plan.  She may benefit erythropoietin therapy in the future as well.  Orders Placed This Encounter  Procedures  . CBC with Differential/Platelet     Standing Status:   Future  Standing Expiration Date:   05/28/2020  . Ferritin    Standing Status:   Future    Standing Expiration Date:   05/28/2020  . Iron and TIBC    Standing Status:   Future    Standing Expiration Date:   05/28/2020    All questions were answered. The patient knows to call the clinic with any problems questions or concerns.  [place]  Return of visit: 2 weeks Thank you for this kind referral and the opportunity to participate in the care of this patient. A copy of today's note is routed to referring provider    Earlie Server, MD, PhD Hematology Oncology [facility] Pager- [phone] 05/29/2019

## 2019-06-04 ENCOUNTER — Other Ambulatory Visit: Payer: Self-pay

## 2019-06-04 ENCOUNTER — Ambulatory Visit: Payer: Medicaid Other

## 2019-06-04 DIAGNOSIS — Z79899 Other long term (current) drug therapy: Secondary | ICD-10-CM

## 2019-06-04 NOTE — Progress Notes (Signed)
Medication Management Clinic Visit Note  Patient: Sylvia [name] MRN: [id number] Date of Birth: 07/06/1955 PCP: [facility]   Christinea Brizuela 64 y.o. female presents for a telephone medication therapy management visit with the pharmacist today. Patient identity verified using two patient identifiers.  There were no vitals taken for this visit.  Patient Information   Past Medical History:  Diagnosis Date  . Anemia of chronic disease 05/16/2019  . Anxiety    h/o  . COPD (chronic obstructive pulmonary disease) (HCC)   . Diabetes mellitus without complication (HCC)   . GERD (gastroesophageal reflux disease)   . Hypertension    bp under control-off meds since 2019      Past Surgical History:  Procedure Laterality Date  . ABDOMINAL HYSTERECTOMY    . AMPUTATION Right 12/27/2018   Procedure: AMPUTATION BELOW KNEE;  Surgeon: Annice Needy, MD;  Location: [facility];  Service: General;  Laterality: Right;  . CHOLECYSTECTOMY    . COLONOSCOPY WITH PROPOFOL N/A 04/16/2019   Procedure: COLONOSCOPY WITH PROPOFOL;  Surgeon: Pasty Spillers, MD;  Location: [facility];  Service: Endoscopy;  Laterality: N/A;  . ESOPHAGOGASTRODUODENOSCOPY (EGD) WITH PROPOFOL N/A 06/15/2018   Procedure: ESOPHAGOGASTRODUODENOSCOPY (EGD) WITH PROPOFOL;  Surgeon: Toledo, Boykin Nearing, MD;  Location: [facility];  Service: Gastroenterology;  Laterality: N/A;  . ESOPHAGOGASTRODUODENOSCOPY (EGD) WITH PROPOFOL N/A 01/03/2019   Procedure: ESOPHAGOGASTRODUODENOSCOPY (EGD) WITH PROPOFOL;  Surgeon: Midge Minium, MD;  Location: [facility];  Service: Endoscopy;  Laterality: N/A;  . GIVENS CAPSULE STUDY  04/16/2019   Procedure: GIVENS CAPSULE STUDY;  Surgeon: Pasty Spillers, MD;  Location: [facility];  Service: Endoscopy;;  . LOWER EXTREMITY ANGIOGRAPHY Right 08/21/2018   Procedure: LOWER EXTREMITY ANGIOGRAPHY;  Surgeon: Annice Needy, MD;  Location: [facility];  Service:  Cardiovascular;  Laterality: Right;  . LOWER EXTREMITY ANGIOGRAPHY Left 08/28/2018   Procedure: LOWER EXTREMITY ANGIOGRAPHY;  Surgeon: Annice Needy, MD;  Location: [facility];  Service: Cardiovascular;  Laterality: Left;  . LOWER EXTREMITY ANGIOGRAPHY Right 08/28/2018   Procedure: Lower Extremity Angiography;  Surgeon: Annice Needy, MD;  Location: [facility];  Service: Cardiovascular;  Laterality: Right;  . LOWER EXTREMITY ANGIOGRAPHY Right 12/18/2018   Procedure: Lower Extremity Angiography;  Surgeon: Annice Needy, MD;  Location: [facility];  Service: Cardiovascular;  Laterality: Right;  . LOWER EXTREMITY ANGIOGRAPHY Right 12/18/2018   Procedure: Lower Extremity Angiography;  Surgeon: Annice Needy, MD;  Location: [facility];  Service: Cardiovascular;  Laterality: Right;  . LOWER EXTREMITY ANGIOGRAPHY Left 12/21/2018   Procedure: Lower Extremity Angiography;  Surgeon: Annice Needy, MD;  Location: [facility];  Service: Cardiovascular;  Laterality: Left;  . LOWER EXTREMITY ANGIOGRAPHY Right 12/21/2018   Procedure: Lower Extremity Angiography;  Surgeon: Annice Needy, MD;  Location: [facility];  Service: Cardiovascular;  Laterality: Right;  . LOWER EXTREMITY INTERVENTION N/A 12/22/2018   Procedure: LOWER EXTREMITY INTERVENTION;  Surgeon: Annice Needy, MD;  Location: [facility];  Service: Cardiovascular;  Laterality: N/A;     Family History  Problem Relation Age of Onset  . Breast cancer Mother 12    New Diagnoses (since last visit): Anemia  Family Support: Good  Lifestyle Diet: Breakfast: oatmeal Lunch: peanut butter & toast, yogurt Dinner: salad with boiled eggs, cucumbers, tomatoes Drinks: protein supplements, diet cranberry juice, diet apple juice, V8 tropical blend Desserts: fruits Fast food: denies  Social History   Substance and Sexual Activity  Alcohol Use No   Denies alcohol consumption   Social History    Tobacco Use  Smoking Status Former Smoker  . Packs/day: 0.25  . Years: 45.00  . Pack years: 11.25  . Types: Cigarettes  . Quit date: 08/28/2018  . Years since quitting: 0.7  Smokeless Tobacco Never Used  Tobacco Comment   quit   Patient reports last cigarette was 8 months ago. Denies illicit drug use.    Health Maintenance  Topic Date Due  . Hepatitis C Screening  1955/10/06  . FOOT EXAM  12/21/1964  . OPHTHALMOLOGY EXAM  12/21/1964  . TETANUS/TDAP  12/21/1973  . PAP SMEAR-Modifier  12/22/1975  . INFLUENZA VACCINE  11/18/2018  . MAMMOGRAM  04/06/2019  . HEMOGLOBIN A1C  10/13/2019  . COLONOSCOPY  04/15/2020  . HIV Screening  Completed   Outpatient Encounter Medications as of 06/04/2019  Medication Sig  . albuterol (PROVENTIL HFA;VENTOLIN HFA) 108 (90 Base) MCG/ACT inhaler Inhale 2 puffs into the lungs every 6 (six) hours as needed for wheezing or shortness of breath.  Marland Kitchen apixaban (ELIQUIS) 2.5 MG TABS tablet Take 1 tablet (2.5 mg total) by mouth 2 (two) times daily.  Marland Kitchen aspirin 81 MG EC tablet Take 1 tablet (81 mg total) by mouth daily.  Marland Kitchen atorvastatin (LIPITOR) 10 MG tablet Take 10 mg by mouth every morning.  . cetirizine (ZYRTEC) 10 MG tablet Take 10 mg by mouth daily.  . clopidogrel (PLAVIX) 75 MG tablet Take 75 mg by mouth daily.  Marland Kitchen docusate sodium (COLACE) 100 MG capsule Take 100 mg by mouth 2 (two) times daily.  . DULoxetine (CYMBALTA) 30 MG capsule Take 30 mg by mouth daily.  . ferrous sulfate 324 MG TBEC Take 324 mg by mouth daily.  Marland Kitchen gabapentin (NEURONTIN) 300 MG capsule Take 1 capsule (300 mg total) by mouth at bedtime.  Marland Kitchen lisinopril (ZESTRIL) 5 MG tablet Take 1 tablet (5 mg total) by mouth daily.  . Melatonin 1 MG TABS Take 1 tablet by mouth at bedtime.  . metoprolol tartrate (LOPRESSOR) 50 MG tablet Take 1 tablet (50 mg total) by mouth 2 (two) times daily.  . Multiple Vitamins-Minerals (WOMENS MULTIVITAMIN PO) Take 1 tablet by mouth daily.  . pantoprazole  (PROTONIX) 40 MG tablet Take 1 tablet (40 mg total) by mouth daily. (Patient taking differently: Take 40 mg by mouth daily. Dennie Bible reports taking twice daily.)  . sitaGLIPtin (JANUVIA) 100 MG tablet Take 100 mg by mouth daily.  . vitamin C (ASCORBIC ACID) 500 MG tablet Take 500 mg by mouth daily.  Marland Kitchen acetaminophen (TYLENOL) 325 MG tablet Take 650 mg by mouth every 6 (six) hours as needed.  . [DISCONTINUED] cholecalciferol (VITAMIN D3) 25 MCG (1000 UT) tablet Take 1,000 Units by mouth daily.  . [DISCONTINUED] HYDROcodone-acetaminophen (NORCO) 5-325 MG tablet Take 1 tablet by mouth every 4 (four) hours as needed for moderate pain.  . [DISCONTINUED] vitamin B-12 (CYANOCOBALAMIN) 100 MCG tablet Take 100 mcg by mouth daily.   No facility-administered encounter medications on file as of 06/04/2019.    Health Maintenance/Date Completed  Last ED visit: 05/19/2019 (wrist pain secondary to fall) Last Visit to PCP: Reports last visit was 2 weeks ago  Next Visit to PCP: Not scheduled at this time Specialist Visit: Follows with hematology (Dr. Cathie Hoops) and gastroenterology (Dr. Maximino Greenland) for anemia and vascular (Dr. Wyn Quaker) for PAD Dental Exam: Wears dentures. Denies dental complaints.  Eye Exam: Wears glasses. Estimates  last eye exam was 3 years ago Pelvic/PAP Exam: History of partial hysterectomy Mammogram: Estimates last was 2 years ago. Mother with history of breast cancer. DEXA: <65 y/o Colonoscopy: 04/16/2019 Flu Vaccine: Has not received annual flu vaccine Pneumonia Vaccine: 06/15/2018 COVID-19 Vaccine: Never Shingrix Vaccine: Never  Assessment and Plan:  1. Anemia of chronic disease -Follows with hematology and gastroenterology -History of melena/PUD. Underwent EGD 04/13/2019 (normal), colonoscopy 04/16/2019 with small polyps removed, and small bowel capsule study 04/18/2019 (inconclusive) -Receiving outpatient iron infusions and is on maintenance oral iron supplementation -Pantoprazole 40 mg BID for  GI prophylaxis  2. 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 She is at high risk of bleeding considering history of GI bleed and anticoagulation with triple therapy.   3. Type-2 Diabetes Mellitus, controlled -Last Hgb A1C was 5.7% on 04/14/2019 which reflected improved glycemic control from 7.9% on 12/15/2018 -Antidiabetic medications include sitagliptin 100 mg daily -On a moderate intensity statin and ASA 81 mg for ASCVD -Due for diabetic eye exam, urine albumin:creatinine screening  4. Hypertension -Lisinopril 5 mg daily, metoprolol 50 mg BID     5. Phantom limb pain -Gabapentin 300 mg daily at bedtime    6. Mood swings: -Duloxetine 30 mg daily  7. COPD:  -Albuterol HFA PRN (uses ~4x/month) -Symptoms controlled  8. Allergies -Cetirizine 10 mg daily  9. Health maintenance -Due for diabetic eye exam and urine ACR screening -Due for mammogram. Provided patient with information for [facility].  -Due for annual influenza vaccine, COVID-19 vaccine, and Shingrix series  10. Medication adherence  -Patient endorses taking medications as prescribed -Utilizes a pillbox -Not requiring refills on medications at this time      RTC 6 months  [place] Resident 04 June 2019

## 2019-06-05 ENCOUNTER — Telehealth (INDEPENDENT_AMBULATORY_CARE_PROVIDER_SITE_OTHER): Payer: Self-pay

## 2019-06-05 NOTE — Telephone Encounter (Signed)
Spoke with pt and made her aware that we have sent the Rx for her sock several times  I gave the pt the number to [employer] so that she could call and check up on her sock Rx

## 2019-06-13 ENCOUNTER — Inpatient Hospital Stay: Payer: Medicaid Other

## 2019-06-13 ENCOUNTER — Other Ambulatory Visit: Payer: Self-pay

## 2019-06-13 DIAGNOSIS — D509 Iron deficiency anemia, unspecified: Secondary | ICD-10-CM | POA: Diagnosis not present

## 2019-06-13 DIAGNOSIS — D649 Anemia, unspecified: Secondary | ICD-10-CM

## 2019-06-13 LAB — IRON AND TIBC
Iron: 135 ug/dL (ref 28–170)
Saturation Ratios: 36 % — ABNORMAL HIGH (ref 10.4–31.8)
TIBC: 379 ug/dL (ref 250–450)
UIBC: 244 ug/dL

## 2019-06-13 LAB — CBC WITH DIFFERENTIAL/PLATELET
Abs Immature Granulocytes: 0.03 10*3/uL (ref 0.00–0.07)
Basophils Absolute: 0.1 10*3/uL (ref 0.0–0.1)
Basophils Relative: 1 %
Eosinophils Absolute: 0.2 10*3/uL (ref 0.0–0.5)
Eosinophils Relative: 3 %
HCT: 23.9 % — ABNORMAL LOW (ref 36.0–46.0)
Hemoglobin: 7.9 g/dL — ABNORMAL LOW (ref 12.0–15.0)
Immature Granulocytes: 1 %
Lymphocytes Relative: 28 %
Lymphs Abs: 1.8 10*3/uL (ref 0.7–4.0)
MCH: 29.9 pg (ref 26.0–34.0)
MCHC: 33.1 g/dL (ref 30.0–36.0)
MCV: 90.5 fL (ref 80.0–100.0)
Monocytes Absolute: 0.4 10*3/uL (ref 0.1–1.0)
Monocytes Relative: 7 %
Neutro Abs: 4 10*3/uL (ref 1.7–7.7)
Neutrophils Relative %: 60 %
Platelets: 305 10*3/uL (ref 150–400)
RBC: 2.64 MIL/uL — ABNORMAL LOW (ref 3.87–5.11)
RDW: 13.8 % (ref 11.5–15.5)
WBC: 6.5 10*3/uL (ref 4.0–10.5)
nRBC: 0 % (ref 0.0–0.2)

## 2019-06-13 LAB — FERRITIN: Ferritin: 69 ng/mL (ref 11–307)

## 2019-06-14 ENCOUNTER — Ambulatory Visit (INDEPENDENT_AMBULATORY_CARE_PROVIDER_SITE_OTHER): Payer: Medicaid Other

## 2019-06-14 ENCOUNTER — Encounter: Payer: Self-pay | Admitting: Oncology

## 2019-06-14 ENCOUNTER — Ambulatory Visit (INDEPENDENT_AMBULATORY_CARE_PROVIDER_SITE_OTHER): Payer: Medicaid Other | Admitting: Nurse Practitioner

## 2019-06-14 ENCOUNTER — Encounter (INDEPENDENT_AMBULATORY_CARE_PROVIDER_SITE_OTHER): Payer: Self-pay | Admitting: Nurse Practitioner

## 2019-06-14 VITALS — BP 134/45 | HR 90 | Resp 12 | Ht 67.0 in | Wt 130.0 lb

## 2019-06-14 DIAGNOSIS — I739 Peripheral vascular disease, unspecified: Secondary | ICD-10-CM

## 2019-06-14 DIAGNOSIS — Z89511 Acquired absence of right leg below knee: Secondary | ICD-10-CM | POA: Diagnosis not present

## 2019-06-14 DIAGNOSIS — G546 Phantom limb syndrome with pain: Secondary | ICD-10-CM

## 2019-06-14 LAB — ERYTHROPOIETIN: Erythropoietin: 45 m[IU]/mL — ABNORMAL HIGH (ref 2.6–18.5)

## 2019-06-14 MED ORDER — GABAPENTIN 300 MG PO CAPS: ORAL_CAPSULE | ORAL | 2 refills | Status: DC

## 2019-06-14 NOTE — Progress Notes (Signed)
Patient contacted for follow up appt. No concerns voiced.

## 2019-06-15 ENCOUNTER — Inpatient Hospital Stay: Payer: Medicaid Other

## 2019-06-15 ENCOUNTER — Inpatient Hospital Stay (HOSPITAL_BASED_OUTPATIENT_CLINIC_OR_DEPARTMENT_OTHER): Payer: Medicaid Other | Admitting: Oncology

## 2019-06-15 ENCOUNTER — Other Ambulatory Visit: Payer: Self-pay

## 2019-06-15 VITALS — BP 129/82 | HR 80 | Temp 95.2°F | Resp 16

## 2019-06-15 DIAGNOSIS — D649 Anemia, unspecified: Secondary | ICD-10-CM | POA: Diagnosis not present

## 2019-06-15 DIAGNOSIS — R700 Elevated erythrocyte sedimentation rate: Secondary | ICD-10-CM | POA: Diagnosis not present

## 2019-06-15 DIAGNOSIS — D509 Iron deficiency anemia, unspecified: Secondary | ICD-10-CM | POA: Diagnosis not present

## 2019-06-15 DIAGNOSIS — D638 Anemia in other chronic diseases classified elsewhere: Secondary | ICD-10-CM

## 2019-06-15 NOTE — Progress Notes (Signed)
Hematology/Oncology  note [facility] Telephone:([phone] [phone]   Patient Care Team: Center, [facility] as PCP - General (General Practice) Earlie Server, MD as Consulting Physician (Hematology and Oncology)  REFERRING PROVIDER: Center, [place] FOR VISIT:  Follow up of iron deficiency anemia  HISTORY OF PRESENTING ILLNESS:  Sylvia [name] is a  64 y.o.  female with PMH listed below was seen in consultation at the request of Center, [place]*   for evaluation of iron deficiency anemia.   Reviewed patient's recent labs  05/09/2019, hemoglobin 8.1, MCV 88, platelet 470. 04/16/2019, iron panel showed saturation 18, ferritin 28, TIBC 308 Patient has been started on oral iron supplementation NiFerrex 150 mg daily.  She reports tolerating well.  Reviewed patient's previous labs ordered by primary care physician's office, anemia is chronic onset , duration is since at least July 2017.  Patient's hemoglobin was about 9 back in September 2020.  She was admitted at that time due to severe PAD and right foot gangrene.  Status post right below-knee amputation.   She was admitted in December due to acute drop of hemoglobin to 6.  She has a history of GI bleed secondary to gastric ulcer.  Received PRBC transfusion.  Patient is on Eliquis 2.5 mg twice daily. No aggravating or improving factors.  Associated signs and symptoms: Patient reports fatigue.  Denies SOB with exertion.  Denies  easy bruising, hematochezia, hemoptysis, hematuria.  She has lost 15 pounds of weight recently. Context:  History of iron deficiency:  Rectal bleeding: Positive for melena.  No recent rectal bleeding. Menstrual bleeding/ Vaginal bleeding : Denies Hematemesis or hemoptysis : denies Blood in urine : denies  Last endoscopy: 04/13/2019 EGD colonoscopy and small bowel capsule study during her hospitalization.  EGD was  negative, 04/16/2019 colonoscopy with small polyps removed, 04/18/2019 small bowel capsule study did not reach this small bowel and was inconclusive.  Fatigue: Yes.  SOB: deneis  INTERVAL HISTORY Sylvia [name] is a 64 y.o. female who has above history reviewed by me today presents for follow up visit for management of anemia Problems and complaints are listed below: Patient was accompanied by her sister. She had 1 dose of IV iron treatment.  Reports feeling no difference.  Continue to feel very fatigued.  No other new complaints. Chronic right wrist pain.  Review of Systems  Constitutional: Positive for fatigue. Negative for appetite change, chills, fever and unexpected weight change.  HENT:   Negative for hearing loss and voice change.   Eyes: Negative for eye problems.  Respiratory: Negative for chest tightness and cough.   Cardiovascular: Negative for chest pain.  Gastrointestinal: Negative for abdominal distention, abdominal pain and blood in stool.  Endocrine: Negative for hot flashes.  Genitourinary: Negative for difficulty urinating and frequency.   Musculoskeletal: Negative for arthralgias.       Right wrist pain  Skin: Negative for itching and rash.  Neurological: Negative for extremity weakness.  Hematological: Negative for adenopathy.  Psychiatric/Behavioral: Negative for confusion.    MEDICAL HISTORY:  Past Medical History:  Diagnosis Date  . Anemia of chronic disease 05/16/2019  . Anxiety    h/o  . COPD (chronic obstructive pulmonary disease) ([place])   . Diabetes mellitus without complication ([address])   . GERD (gastroesophageal reflux disease)   . Hypertension    bp under control-off meds since 2019    SURGICAL HISTORY: Past Surgical History:  Procedure Laterality Date  .  ABDOMINAL HYSTERECTOMY    . AMPUTATION Right 12/27/2018   Procedure: AMPUTATION BELOW KNEE;  Surgeon: Algernon Huxley, MD;  Location: [facility] ORS;  Service: General;  Laterality: Right;  .  CHOLECYSTECTOMY    . COLONOSCOPY WITH PROPOFOL N/A 04/16/2019   Procedure: COLONOSCOPY WITH PROPOFOL;  Surgeon: Virgel Manifold, MD;  Location: [facility] END[facility];  Service: Endoscopy;  Laterality: N/A;  . ESOPHAGOGASTRODUODENOSCOPY (EGD) WITH PROPOFOL N/A 06/15/2018   Procedure: ESOPHAGOGASTRODUODENOSCOPY (EGD) WITH PROPOFOL;  Surgeon: [place], Benay Pike, MD;  Location: [facility] ENDOSCOPY;  Service: Gastroenterology;  Laterality: N/A;  . ESOPHAGOGASTRODUODENOSCOPY (EGD) WITH PROPOFOL N/A 01/03/2019   Procedure: ESOPHAGOGASTRODUODENOSCOPY (EGD) WITH PROPOFOL;  Surgeon: Lucilla Lame, MD;  Location: [facility] ENDOSCOPY;  Service: Endoscopy;  Laterality: N/A;  . GIVENS CAPSULE STUDY  04/16/2019   Procedure: GIVENS CAPSULE STUDY;  Surgeon: Virgel Manifold, MD;  Location: [facility] ENDOSCOPY;  Service: Endoscopy;;  . LOWER EXTREMITY ANGIOGRAPHY Right 08/21/2018   Procedure: LOWER EXTREMITY ANGIOGRAPHY;  Surgeon: Algernon Huxley, MD;  Location: [place] [facility];  Service: Cardiovascular;  Laterality: Right;  . LOWER EXTREMITY ANGIOGRAPHY Left 08/28/2018   Procedure: LOWER EXTREMITY ANGIOGRAPHY;  Surgeon: Algernon Huxley, MD;  Location: [place] [facility];  Service: Cardiovascular;  Laterality: Left;  . LOWER EXTREMITY ANGIOGRAPHY Right 08/28/2018   Procedure: Lower Extremity Angiography;  Surgeon: Algernon Huxley, MD;  Location: [place] [facility];  Service: Cardiovascular;  Laterality: Right;  . LOWER EXTREMITY ANGIOGRAPHY Right 12/18/2018   Procedure: Lower Extremity Angiography;  Surgeon: Algernon Huxley, MD;  Location: [place] [facility];  Service: Cardiovascular;  Laterality: Right;  . LOWER EXTREMITY ANGIOGRAPHY Right 12/18/2018   Procedure: Lower Extremity Angiography;  Surgeon: Algernon Huxley, MD;  Location: [place] [facility];  Service: Cardiovascular;  Laterality: Right;  . LOWER EXTREMITY ANGIOGRAPHY Left 12/21/2018   Procedure: Lower Extremity Angiography;  Surgeon: Algernon Huxley, MD;  Location: [place] [facility];  Service: Cardiovascular;  Laterality: Left;  . LOWER EXTREMITY ANGIOGRAPHY Right 12/21/2018   Procedure: Lower Extremity Angiography;  Surgeon: Algernon Huxley, MD;  Location: [place] [facility];  Service: Cardiovascular;  Laterality: Right;  . LOWER EXTREMITY INTERVENTION N/A 12/22/2018   Procedure: LOWER EXTREMITY INTERVENTION;  Surgeon: Algernon Huxley, MD;  Location: [place] [facility];  Service: Cardiovascular;  Laterality: N/A;    SOCIAL HISTORY: Social History   Socioeconomic History  . Marital status: Divorced    Spouse name: Not on file  . Number of children: Not on file  . Years of education: Not on file  . Highest education level: Not on file  Occupational History  . Not on file  Tobacco Use  . Smoking status: Former Smoker    Packs/day: 0.25    Years: 45.00    Pack years: 11.25    Types: Cigarettes    Quit date: 08/28/2018    Years since quitting: 0.7  . Smokeless tobacco: Never Used  . Tobacco comment: quit  Substance and Sexual Activity  . Alcohol use: No  . Drug use: Never  . Sexual activity: Not Currently  Other Topics Concern  . Not on file  Social History Narrative  . Not on file   Social Determinants of Health   Financial Resource Strain:   . Difficulty of Paying Living Expenses: Not on file  Food Insecurity:   . Worried About Charity fundraiser in the Last Year: Not on file  . Ran Out of Food in the  Last Year: Not on file  Transportation Needs:   . Lack of Transportation (Medical): Not on file  . Lack of Transportation (Non-Medical): Not on file  Physical Activity:   . Days of Exercise per Week: Not on file  . Minutes of Exercise per Session: Not on file  Stress:   . Feeling of Stress : Not on file  Social Connections:   . Frequency of Communication with Friends and Family: Not on file  . Frequency of Social Gatherings with Friends and Family: Not on file  . Attends Religious Services: Not on file  . Active Member of Clubs or Organizations:  Not on file  . Attends Archivist Meetings: Not on file  . Marital Status: Not on file  Intimate Partner Violence:   . Fear of Current or Ex-Partner: Not on file  . Emotionally Abused: Not on file  . Physically Abused: Not on file  . Sexually Abused: Not on file    FAMILY HISTORY: Family History  Problem Relation Age of Onset  . Breast cancer Mother 57    ALLERGIES:  is allergic to metformin and related; penicillins; and tramadol.  MEDICATIONS:  Current Outpatient Medications  Medication Sig Dispense Refill  . acetaminophen (TYLENOL) 325 MG tablet Take 650 mg by mouth every 6 (six) hours as needed.    Marland Kitchen albuterol (PROVENTIL HFA;VENTOLIN HFA) 108 (90 Base) MCG/ACT inhaler Inhale 2 puffs into the lungs every 6 (six) hours as needed for wheezing or shortness of breath. 1 Inhaler 2  . apixaban (ELIQUIS) 2.5 MG TABS tablet Take 1 tablet (2.5 mg total) by mouth 2 (two) times daily. 60 tablet 3  . aspirin 81 MG EC tablet Take 1 tablet (81 mg total) by mouth daily. 30 tablet 3  . atorvastatin (LIPITOR) 10 MG tablet Take 10 mg by mouth every morning.    . cetirizine (ZYRTEC) 10 MG tablet Take 10 mg by mouth daily.    . clopidogrel (PLAVIX) 75 MG tablet Take 75 mg by mouth daily.    Marland Kitchen docusate sodium (COLACE) 100 MG capsule Take 100 mg by mouth 2 (two) times daily.    . DULoxetine (CYMBALTA) 30 MG capsule Take 30 mg by mouth daily.    . ferrous sulfate 324 MG TBEC Take 324 mg by mouth daily.    Marland Kitchen gabapentin (NEURONTIN) 300 MG capsule Take 2 Capsules (600 mg) at Bedtime and 1 capsule (300 mg) in the afternoon 90 capsule 2  . lisinopril (ZESTRIL) 5 MG tablet Take 1 tablet (5 mg total) by mouth daily. 30 tablet 0  . Melatonin 1 MG TABS Take 1 tablet by mouth at bedtime.    . metoprolol tartrate (LOPRESSOR) 50 MG tablet Take 1 tablet (50 mg total) by mouth 2 (two) times daily. 60 tablet 0  . Multiple Vitamins-Minerals (WOMENS MULTIVITAMIN PO) Take 1 tablet by mouth daily.    .  pantoprazole (PROTONIX) 40 MG tablet Take 1 tablet (40 mg total) by mouth daily. (Patient taking differently: Take 40 mg by mouth daily. Fraser Din reports taking twice daily.) 30 tablet 0  . sitaGLIPtin (JANUVIA) 100 MG tablet Take 100 mg by mouth daily.    . vitamin C (ASCORBIC ACID) 500 MG tablet Take 500 mg by mouth daily.     No current facility-administered medications for this visit.     PHYSICAL EXAMINATION: ECOG PERFORMANCE STATUS: 1 - Symptomatic but completely ambulatory Vitals:   06/15/19 1306  BP: 129/82  Pulse: 80  Resp: 16  Temp: (!) 95.2 F (35.1 C)  SpO2: 100%   There were no vitals filed for this visit.  Physical Exam Constitutional:      General: She is not in acute distress.    Comments: Patient sits in the wheelchair.  HENT:     Head: Normocephalic and atraumatic.  Eyes:     General: No scleral icterus. Cardiovascular:     Rate and Rhythm: Normal rate and regular rhythm.     Heart sounds: Normal heart sounds.  Pulmonary:     Effort: Pulmonary effort is normal. No respiratory distress.     Breath sounds: No wheezing.  Abdominal:     General: Bowel sounds are normal. There is no distension.     Palpations: Abdomen is soft.  Musculoskeletal:        General: No deformity. Normal range of motion.     Cervical back: Normal range of motion and neck supple.     Comments: Right lower extremity below-knee amputation, stump is well-healed.  No discharge.  Skin:    General: Skin is warm and dry.     Findings: No erythema or rash.  Neurological:     Mental Status: She is alert and oriented to person, place, and time. Mental status is at baseline.     Cranial Nerves: No cranial nerve deficit.     Coordination: Coordination normal.  Psychiatric:        Mood and Affect: Mood normal.       CMP Latest Ref Rng & Units 05/16/2019  Glucose 70 - 99 mg/dL 230(H)  BUN 8 - 23 mg/dL 11  Creatinine 0.44 - 1.00 mg/dL 0.70  Sodium 135 - 145 mmol/L 138  Potassium 3.5 - 5.1  mmol/L 3.5  Chloride 98 - 111 mmol/L 105  CO2 22 - 32 mmol/L 25  Calcium 8.9 - 10.3 mg/dL 9.6  Total Protein 6.5 - 8.1 g/dL 7.6  Total Bilirubin 0.3 - 1.2 mg/dL 0.7  Alkaline Phos 38 - 126 U/L 91  AST 15 - 41 U/L 18  ALT 0 - 44 U/L 17   CBC Latest Ref Rng & Units 06/13/2019  WBC 4.0 - 10.5 K/uL 6.5  Hemoglobin 12.0 - 15.0 g/dL 7.9(L)  Hematocrit 36.0 - 46.0 % 23.9(L)  Platelets 150 - 400 K/uL 305     LABORATORY DATA:  I have reviewed the data as listed Lab Results  Component Value Date   WBC 6.5 06/13/2019   HGB 7.9 (L) 06/13/2019   HCT 23.9 (L) 06/13/2019   MCV 90.5 06/13/2019   PLT 305 06/13/2019   Recent Labs    12/14/18 1240 12/14/18 2237 12/25/18 0806 12/26/18 0328 04/15/19 0415 04/17/19 0449 05/16/19 1142  NA 141   < > 141   < > 142 139 138  K 3.8   < > 4.4   < > 3.1* 3.0* 3.5  CL 104   < > 111   < > 110 108 105  CO2 20*   < > 20*   < > 23 21* 25  GLUCOSE 291*   < > 104*   < > 145* 172* 230*  BUN 14   < > 28*   < > 10 7* 11  CREATININE 1.04*   < > 1.62*   < > 0.72 0.63 0.70  CALCIUM 10.8*   < > 8.6*   < > 9.1 9.0 9.6  GFRNONAA 57*   < > 33*   < > >60 >60 >60  GFRAA >  60   < > 38*   < > >60 >60 >60  PROT 8.4*  --  6.6  --   --   --  7.6  ALBUMIN 4.3  --  2.6*  --   --   --  3.9  AST 17  --  117*  --   --   --  18  ALT 15  --  56*  --   --   --  17  ALKPHOS 86  --  104  --   --   --  91  BILITOT 1.2  --  0.8  --   --   --  0.7  BILIDIR 0.1  --   --   --   --   --   --   IBILI 1.1*  --   --   --   --   --   --    < > = values in this interval not displayed.   Iron/TIBC/Ferritin/ %Sat    Component Value Date/Time   IRON 135 06/13/2019 1528   TIBC 379 06/13/2019 1528   FERRITIN 69 06/13/2019 1528   IRONPCTSAT 36 (H) 06/13/2019 1528     DG Wrist Complete Right  Result Date: 05/19/2019 CLINICAL DATA:  Fall yesterday. Right wrist pain. Initial encounter. EXAM: RIGHT WRIST - COMPLETE 3+ VIEW COMPARISON:  None. FINDINGS: A tiny ossific density is seen  adjacent to the radial styloid process, suspicious for a tiny avulsion fracture fragment. No other fractures or bone lesions identified. No evidence of arthropathy. IMPRESSION: Suspect tiny avulsion fracture fragment adjacent to the radial styloid process. Recommend clinical correlation for point tenderness at this site. Electronically Signed   By: Marlaine Hind M.D.   On: 05/19/2019 17:14      ASSESSMENT & PLAN:  1. Normocytic anemia   2. Anemia of chronic disease   3. ESR raised    Anemia: multifactorial hemoglobin 7.8. Labs reviewed and discussed with patient. Hemoglobin does not respond to IV iron treatments.  Given that her iron panel is not consistent with iron deficiency, I will hold off additional IV iron treatments. Multiple myeloma panel was negative for M proteins,  elevated kappa light chain, kappa lambda light chain ratio. Increased ESR, negative ANA, C-reactive protein. Erythropoietin level is elevated-  History of recurrent acute GI bleeding/melena, history of PUD on chronic anticoagulation. Small bowel capsule study was inconclusive.  EGD showed no signs of bleeding.  Colonoscopy due to diverticulosis in the colon polyps.  Continue follow-up with gastroenterology and repeat capsule study as outpatient.  Fatigue secondary to anemia, recommend type and screen and infuse 1 unit of PRBC. Elevated ESR, refer to rheumatology for further evaluation.  Bone marrow biopsy was discussed with patient and she prefers to see rheumatology first. Follow-up in 6 weeks.  Orders Placed This Encounter  Procedures  . CBC with Differential/Platelet    Standing Status:   Future    Standing Expiration Date:   06/14/2020  . Ferritin    Standing Status:   Future    Standing Expiration Date:   06/14/2020  . Iron and TIBC    Standing Status:   Future    Standing Expiration Date:   06/14/2020  . Ambulatory referral to Rheumatology    Referral Priority:   Routine    Referral Type:   Consultation     Referral Reason:   Specialty Services Required    Referred to Provider:   Marlowe Sax, MD  Requested Specialty:   Rheumatology    Number of Visits Requested:   1  . Type and screen    Standing Status:   Future    Standing Expiration Date:   06/14/2020    All questions were answered. The patient knows to call the clinic with any problems questions or concerns.   Return of visit: 6 weeks   Earlie Server, MD, PhD Hematology Oncology [facility] Pager- [phone] 06/15/2019

## 2019-06-19 ENCOUNTER — Encounter (INDEPENDENT_AMBULATORY_CARE_PROVIDER_SITE_OTHER): Payer: Self-pay | Admitting: Nurse Practitioner

## 2019-06-19 NOTE — Progress Notes (Signed)
SUBJECTIVE:  Patient ID: Sylvia [name], female    DOB: Sep 02, 1955, 64 y.o.   MRN: [id number] Chief Complaint  Patient presents with  . Follow-up    U/S Follow up    HPI  Sylvia [name] is a 64 y.o. female presents today to follow-up noninvasive studies regarding her peripheral arterial disease.  Today the patient states that she feels well overall.  She does complain of continued phantom limb pain.  She states that the gabapentin helps somewhat but it is not quite enough.  The patient still has not yet received her prosthetic.  The patient does continue to take her Eliquis as prescribed.  The patient did have an episode with subsequent GI bleed in December however this is resolved at that time.  They were unable to find a cause of the bleeding, therefore the patient is following up with hematology oncology regarding anemia.  She denies any fever, chills, nausea, vomiting or diarrhea.  Today the patient underwent noninvasive studies.  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  The left leg has a 0.84 ABI previously was 1.01 on 01/16/2019.  Past Medical History:  Diagnosis Date  . Anemia of chronic disease 05/16/2019  . Anxiety    h/o  . COPD (chronic obstructive pulmonary disease) ([place])   . Diabetes mellitus without complication ([place])   . GERD (gastroesophageal reflux disease)   . Hypertension    bp under control-off meds since 2019    Past Surgical History:  Procedure Laterality Date  . ABDOMINAL HYSTERECTOMY    . AMPUTATION Right 12/27/2018   Procedure: AMPUTATION BELOW KNEE;  Surgeon: Algernon Huxley, MD;  Location: [facility];  Service: General;  Laterality: Right;  . CHOLECYSTECTOMY    . COLONOSCOPY WITH PROPOFOL N/A 04/16/2019   Procedure: COLONOSCOPY WITH PROPOFOL;  Surgeon: Virgel Manifold, MD;  Location: [facility];  Service: Endoscopy;  Laterality: N/A;  . ESOPHAGOGASTRODUODENOSCOPY (EGD) WITH PROPOFOL N/A 06/15/2018   Procedure: ESOPHAGOGASTRODUODENOSCOPY  (EGD) WITH PROPOFOL;  Surgeon: Toledo, Benay Pike, MD;  Location: [facility];  Service: Gastroenterology;  Laterality: N/A;  . ESOPHAGOGASTRODUODENOSCOPY (EGD) WITH PROPOFOL N/A 01/03/2019   Procedure: ESOPHAGOGASTRODUODENOSCOPY (EGD) WITH PROPOFOL;  Surgeon: Lucilla Lame, MD;  Location: [facility];  Service: Endoscopy;  Laterality: N/A;  . GIVENS CAPSULE STUDY  04/16/2019   Procedure: GIVENS CAPSULE STUDY;  Surgeon: Virgel Manifold, MD;  Location: [facility];  Service: Endoscopy;;  . LOWER EXTREMITY ANGIOGRAPHY Right 08/21/2018   Procedure: LOWER EXTREMITY ANGIOGRAPHY;  Surgeon: Algernon Huxley, MD;  Location: [place] [facility];  Service: Cardiovascular;  Laterality: Right;  . LOWER EXTREMITY ANGIOGRAPHY Left 08/28/2018   Procedure: LOWER EXTREMITY ANGIOGRAPHY;  Surgeon: Algernon Huxley, MD;  Location: [place] [facility];  Service: Cardiovascular;  Laterality: Left;  . LOWER EXTREMITY ANGIOGRAPHY Right 08/28/2018   Procedure: Lower Extremity Angiography;  Surgeon: Algernon Huxley, MD;  Location: [place] [facility];  Service: Cardiovascular;  Laterality: Right;  . LOWER EXTREMITY ANGIOGRAPHY Right 12/18/2018   Procedure: Lower Extremity Angiography;  Surgeon: Algernon Huxley, MD;  Location: [place] [facility];  Service: Cardiovascular;  Laterality: Right;  . LOWER EXTREMITY ANGIOGRAPHY Right 12/18/2018   Procedure: Lower Extremity Angiography;  Surgeon: Algernon Huxley, MD;  Location: [place] [facility];  Service: Cardiovascular;  Laterality: Right;  . LOWER EXTREMITY ANGIOGRAPHY Left 12/21/2018   Procedure: Lower Extremity Angiography;  Surgeon: Algernon Huxley, MD;  Location: [place] [facility];  Service: Cardiovascular;  Laterality: Left;  .  LOWER EXTREMITY ANGIOGRAPHY Right 12/21/2018   Procedure: Lower Extremity Angiography;  Surgeon: Annice Needy, MD;  Location: [facility];  Service: Cardiovascular;  Laterality: Right;  . LOWER EXTREMITY INTERVENTION N/A 12/22/2018   Procedure:  LOWER EXTREMITY INTERVENTION;  Surgeon: Annice Needy, MD;  Location: [facility];  Service: Cardiovascular;  Laterality: N/A;    Social History   Socioeconomic History  . Marital status: Divorced    Spouse name: Not on file  . Number of children: Not on file  . Years of education: Not on file  . Highest education level: Not on file  Occupational History  . Not on file  Tobacco Use  . Smoking status: Former Smoker    Packs/day: 0.25    Years: 45.00    Pack years: 11.25    Types: Cigarettes    Quit date: 08/28/2018    Years since quitting: 0.8  . Smokeless tobacco: Never Used  . Tobacco comment: quit  Substance and Sexual Activity  . Alcohol use: No  . Drug use: Never  . Sexual activity: Not Currently  Other Topics Concern  . Not on file  Social History Narrative  . Not on file   Social Determinants of Health   Financial Resource Strain:   . Difficulty of Paying Living Expenses: Not on file  Food Insecurity:   . Worried About Programme researcher, broadcasting/film/video in the Last Year: Not on file  . Ran Out of Food in the Last Year: Not on file  Transportation Needs:   . Lack of Transportation (Medical): Not on file  . Lack of Transportation (Non-Medical): Not on file  Physical Activity:   . Days of Exercise per Week: Not on file  . Minutes of Exercise per Session: Not on file  Stress:   . Feeling of Stress : Not on file  Social Connections:   . Frequency of Communication with Friends and Family: Not on file  . Frequency of Social Gatherings with Friends and Family: Not on file  . Attends Religious Services: Not on file  . Active Member of Clubs or Organizations: Not on file  . Attends Banker Meetings: Not on file  . Marital Status: Not on file  Intimate Partner Violence:   . Fear of Current or Ex-Partner: Not on file  . Emotionally Abused: Not on file  . Physically Abused: Not on file  . Sexually Abused: Not on file    Family History  Problem Relation Age  of Onset  . Breast cancer Mother 19    Allergies  Allergen Reactions  . Metformin And Related Diarrhea  . Penicillins Hives    Has patient had a PCN reaction causing immediate rash, facial/tongue/throat swelling, SOB or lightheadedness with hypotension: Yes Has patient had a PCN reaction causing severe rash involving mucus membranes or skin necrosis: No Has patient had a PCN reaction that required hospitalization: No Has patient had a PCN reaction occurring within the last 10 years: No If all of the above answers are "NO", then may proceed with Cephalosporin use.  . Tramadol Itching     Review of Systems   Review of Systems: Negative Unless Checked Constitutional: [] Weight loss  [] Fever  [] Chills Cardiac: [] Chest pain   []  Atrial Fibrillation  [] Palpitations   [] Shortness of breath when laying flat   [] Shortness of breath with exertion. [] Shortness of breath at rest Vascular:  [] Pain in legs with walking   [] Pain in legs with standing [] Pain  in legs when laying flat   [] Claudication    [] Pain in feet when laying flat    [] History of DVT   [] Phlebitis   [] Swelling in legs   [] Varicose veins   [] Non-healing ulcers Pulmonary:   [] Uses home oxygen   [] Productive cough   [] Hemoptysis   [] Wheeze  [] COPD   [] Asthma Neurologic:  [] Dizziness   [] Seizures  [] Blackouts [] History of stroke   [] History of TIA  [] Aphasia   [] Temporary Blindness   [] Weakness or numbness in arm   [] Weakness or numbness in leg Musculoskeletal:   [] Joint swelling   [] Joint pain   [] Low back pain  []  History of Knee Replacement [] Arthritis [] back Surgeries  []  Spinal Stenosis    Hematologic:  [] Easy bruising  [] Easy bleeding   [] Hypercoagulable state   [x] Anemic Gastrointestinal:  [] Diarrhea   [] Vomiting  [] Gastroesophageal reflux/heartburn   [] Difficulty swallowing. [] Abdominal pain Genitourinary:  [x] Chronic kidney disease   [] Difficult urination  [] Anuric   [] Blood in urine [] Frequent urination  [] Burning with urination    [] Hematuria Skin:  [] Rashes   [] Ulcers [] Wounds Psychological:  [] History of anxiety   []  History of major depression  []  Memory Difficulties      OBJECTIVE:   Physical Exam  BP (!) 134/45   Pulse 90   Resp 12   Ht 5\' 7"  (1.702 m)   Wt 130 lb (59 kg)   BMI 20.36 kg/m   Gen: WD/WN, NAD Head: [place]/AT, No temporalis wasting.  Ear/Nose/Throat: Hearing grossly intact, nares w/o erythema or drainage Eyes: PER, EOMI, sclera nonicteric.  Neck: Supple, no masses.  No JVD.  Pulmonary:  Good air movement, no use of accessory muscles.  Cardiac: RRR Vascular:  Vessel Right Left  Radial Palpable Palpable  Dorsalis Pedis Palpable Not Palpable  Posterior Tibial Palpable Not Palpable   Gastrointestinal: soft, non-distended. No guarding/no peritoneal signs.  Musculoskeletal: M/S 5/5 throughout.    Right below-knee amputation Neurologic: Pain and light touch intact in extremities.  Symmetrical.  Speech is fluent. Motor exam as listed above. Psychiatric: Judgment intact, Mood & affect appropriate for pt's clinical situation. Dermatologic: No Venous rashes. No Ulcers Noted.  No changes consistent with cellulitis. Lymph : No Cervical lymphadenopathy, no lichenification or skin changes of chronic lymphedema.       ASSESSMENT AND PLAN:  1. PAD (peripheral artery disease) (HCC)  Recommend:  The patient has evidence of atherosclerosis of the lower extremities with claudication.  The patient does not voice lifestyle limiting changes at this point in time.  Noninvasive studies do not suggest clinically significant change.  No invasive studies, angiography or surgery at this time The patient should continue walking and begin a more formal exercise program.  The patient should continue antiplatelet therapy and aggressive treatment of the lipid abnormalities  No changes in the patient's medications at this time  The patient should continue wearing graduated compression socks 10-15 mmHg strength  to control the mild edema.    2. Hx of right BKA (HCC) Patient still has not received prosthetic shrinker.  Stump is completely healed at this time.  We have refaxed the prescription referral to [facility].  Patient also given the number to reach out directly to them.  3. Phantom pain after amputation of lower extremity (HCC) Patient was previously on 300 mg nightly so we have increased the dosage to 600 at bedtime and 300 during the day.  Hopefully this will help with her pain.  Discussed with patient that it takes a  little bit of time for Neurontin to reach its full potential and that we still have a lot of room for titrating up.  Patient advised to contact her office in a month or so if she still continues to have worsening phantom limb pain or if it is not improving somewhat. - gabapentin (NEURONTIN) 300 MG capsule; Take 2 Capsules (600 mg) at Bedtime and 1 capsule (300 mg) in the afternoon  Dispense: 90 capsule; Refill: 2   Current Outpatient Medications on File Prior to Visit  Medication Sig Dispense Refill  . albuterol (PROVENTIL HFA;VENTOLIN HFA) 108 (90 Base) MCG/ACT inhaler Inhale 2 puffs into the lungs every 6 (six) hours as needed for wheezing or shortness of breath. 1 Inhaler 2  . apixaban (ELIQUIS) 2.5 MG TABS tablet Take 1 tablet (2.5 mg total) by mouth 2 (two) times daily. 60 tablet 3  . aspirin 81 MG EC tablet Take 1 tablet (81 mg total) by mouth daily. 30 tablet 3  . atorvastatin (LIPITOR) 10 MG tablet Take 10 mg by mouth every morning.    . cetirizine (ZYRTEC) 10 MG tablet Take 10 mg by mouth daily.    . clopidogrel (PLAVIX) 75 MG tablet Take 75 mg by mouth daily.    Marland Kitchen docusate sodium (COLACE) 100 MG capsule Take 100 mg by mouth 2 (two) times daily.    . DULoxetine (CYMBALTA) 30 MG capsule Take 30 mg by mouth daily.    . ferrous sulfate 324 MG TBEC Take 324 mg by mouth daily.    Marland Kitchen lisinopril (ZESTRIL) 5 MG tablet Take 1 tablet (5 mg total) by mouth daily. 30 tablet 0  .  Melatonin 1 MG TABS Take 1 tablet by mouth at bedtime.    . metoprolol tartrate (LOPRESSOR) 50 MG tablet Take 1 tablet (50 mg total) by mouth 2 (two) times daily. 60 tablet 0  . Multiple Vitamins-Minerals (WOMENS MULTIVITAMIN PO) Take 1 tablet by mouth daily.    . pantoprazole (PROTONIX) 40 MG tablet Take 1 tablet (40 mg total) by mouth daily. (Patient taking differently: Take 40 mg by mouth daily. Dennie Bible reports taking twice daily.) 30 tablet 0  . sitaGLIPtin (JANUVIA) 100 MG tablet Take 100 mg by mouth daily.    . vitamin C (ASCORBIC ACID) 500 MG tablet Take 500 mg by mouth daily.    Marland Kitchen acetaminophen (TYLENOL) 325 MG tablet Take 650 mg by mouth every 6 (six) hours as needed.     No current facility-administered medications on file prior to visit.    There are no Patient Instructions on file for this visit. No follow-ups on file.   Georgiana Spinner, NP  This note was completed with Office manager.  Any errors are purely unintentional.

## 2019-06-21 ENCOUNTER — Other Ambulatory Visit: Admission: RE | Admit: 2019-06-21 | Payer: Medicaid Other | Source: Ambulatory Visit

## 2019-06-22 ENCOUNTER — Other Ambulatory Visit: Payer: Self-pay

## 2019-06-22 ENCOUNTER — Other Ambulatory Visit: Payer: Self-pay | Admitting: Oncology

## 2019-06-22 ENCOUNTER — Inpatient Hospital Stay: Payer: Medicaid Other | Attending: Oncology

## 2019-06-22 DIAGNOSIS — D649 Anemia, unspecified: Secondary | ICD-10-CM

## 2019-06-22 DIAGNOSIS — R700 Elevated erythrocyte sedimentation rate: Secondary | ICD-10-CM | POA: Insufficient documentation

## 2019-06-24 LAB — PREPARE RBC (CROSSMATCH)

## 2019-06-25 ENCOUNTER — Ambulatory Visit
Admission: RE | Admit: 2019-06-25 | Discharge: 2019-06-25 | Disposition: A | Discharge status: Home / Self Care | Payer: Medicaid Other | Attending: Gastroenterology | Admitting: Gastroenterology

## 2019-06-25 ENCOUNTER — Encounter
Admission: RE | Disposition: A | Discharge status: Home / Self Care | Payer: Self-pay | Source: Home / Self Care | Attending: Gastroenterology

## 2019-06-25 ENCOUNTER — Telehealth: Payer: Self-pay | Admitting: Gastroenterology

## 2019-06-25 ENCOUNTER — Inpatient Hospital Stay: Payer: Medicaid Other | Attending: Oncology

## 2019-06-25 ENCOUNTER — Other Ambulatory Visit: Payer: Self-pay

## 2019-06-25 DIAGNOSIS — E119 Type 2 diabetes mellitus without complications: Secondary | ICD-10-CM | POA: Insufficient documentation

## 2019-06-25 DIAGNOSIS — I10 Essential (primary) hypertension: Secondary | ICD-10-CM | POA: Diagnosis not present

## 2019-06-25 DIAGNOSIS — Q2733 Arteriovenous malformation of digestive system vessel: Secondary | ICD-10-CM | POA: Insufficient documentation

## 2019-06-25 DIAGNOSIS — Z90710 Acquired absence of both cervix and uterus: Secondary | ICD-10-CM | POA: Diagnosis not present

## 2019-06-25 DIAGNOSIS — D649 Anemia, unspecified: Secondary | ICD-10-CM | POA: Diagnosis not present

## 2019-06-25 DIAGNOSIS — Z87891 Personal history of nicotine dependence: Secondary | ICD-10-CM | POA: Diagnosis not present

## 2019-06-25 DIAGNOSIS — Z79899 Other long term (current) drug therapy: Secondary | ICD-10-CM | POA: Diagnosis not present

## 2019-06-25 DIAGNOSIS — J449 Chronic obstructive pulmonary disease, unspecified: Secondary | ICD-10-CM | POA: Insufficient documentation

## 2019-06-25 DIAGNOSIS — Z7982 Long term (current) use of aspirin: Secondary | ICD-10-CM | POA: Diagnosis not present

## 2019-06-25 DIAGNOSIS — D509 Iron deficiency anemia, unspecified: Secondary | ICD-10-CM | POA: Diagnosis not present

## 2019-06-25 HISTORY — PX: GIVENS CAPSULE STUDY: SHX5432

## 2019-06-25 SURGERY — IMAGING PROCEDURE, GI TRACT, INTRALUMINAL, VIA CAPSULE

## 2019-06-25 MED ORDER — SODIUM CHLORIDE 0.9% IV SOLUTION
250.0000 mL | Freq: Once | INTRAVENOUS | Status: AC
  Administered 2019-06-25: 250 mL via INTRAVENOUS
  Filled 2019-06-25: qty 250

## 2019-06-25 MED ORDER — DIPHENHYDRAMINE HCL 25 MG PO CAPS
25.0000 mg | ORAL_CAPSULE | Freq: Once | ORAL | Status: AC
  Administered 2019-06-25: 25 mg via ORAL
  Filled 2019-06-25: qty 1

## 2019-06-25 MED ORDER — ACETAMINOPHEN 325 MG PO TABS
650.0000 mg | ORAL_TABLET | Freq: Once | ORAL | Status: AC
  Administered 2019-06-25: 650 mg via ORAL
  Filled 2019-06-25: qty 2

## 2019-06-25 NOTE — Telephone Encounter (Signed)
Pt left vm regarding a recall letter she has received colonoscopy

## 2019-06-26 ENCOUNTER — Encounter: Payer: Self-pay | Admitting: *Deleted

## 2019-06-26 LAB — BPAM RBC
Blood Product Expiration Date: 202103292359
ISSUE DATE / TIME: 202103080946
Unit Type and Rh: 5100

## 2019-06-26 LAB — TYPE AND SCREEN
ABO/RH(D): O POS
Antibody Screen: NEGATIVE
Unit division: 0

## 2019-06-26 NOTE — Telephone Encounter (Signed)
Patient received a recall letter to schedule her colonoscopy.  Her repeat colonoscopy is due to poor bowel prep from last year.  She just had a Capsule Study yesterday with Dr. Tobi Bastos.  Please advise as to whether she needs to have colonoscopy scheduled now or later.  Thanks,  [place]

## 2019-07-25 ENCOUNTER — Emergency Department: Payer: Medicaid Other

## 2019-07-25 ENCOUNTER — Observation Stay
Admission: EM | Admit: 2019-07-25 | Discharge: 2019-07-26 | Disposition: A | Discharge status: Home / Self Care | Payer: Medicaid Other | Attending: Internal Medicine | Admitting: Internal Medicine

## 2019-07-25 ENCOUNTER — Other Ambulatory Visit: Payer: Self-pay

## 2019-07-25 ENCOUNTER — Encounter: Payer: Self-pay | Admitting: Emergency Medicine

## 2019-07-25 ENCOUNTER — Observation Stay: Payer: Medicaid Other

## 2019-07-25 DIAGNOSIS — Z794 Long term (current) use of insulin: Secondary | ICD-10-CM | POA: Diagnosis not present

## 2019-07-25 DIAGNOSIS — E119 Type 2 diabetes mellitus without complications: Secondary | ICD-10-CM | POA: Diagnosis not present

## 2019-07-25 DIAGNOSIS — E785 Hyperlipidemia, unspecified: Secondary | ICD-10-CM | POA: Diagnosis present

## 2019-07-25 DIAGNOSIS — Z7982 Long term (current) use of aspirin: Secondary | ICD-10-CM | POA: Diagnosis not present

## 2019-07-25 DIAGNOSIS — Z888 Allergy status to other drugs, medicaments and biological substances status: Secondary | ICD-10-CM | POA: Insufficient documentation

## 2019-07-25 DIAGNOSIS — D62 Acute posthemorrhagic anemia: Secondary | ICD-10-CM | POA: Diagnosis present

## 2019-07-25 DIAGNOSIS — Z20822 Contact with and (suspected) exposure to covid-19: Secondary | ICD-10-CM | POA: Diagnosis not present

## 2019-07-25 DIAGNOSIS — I081 Rheumatic disorders of both mitral and tricuspid valves: Secondary | ICD-10-CM | POA: Diagnosis not present

## 2019-07-25 DIAGNOSIS — K922 Gastrointestinal hemorrhage, unspecified: Secondary | ICD-10-CM | POA: Diagnosis present

## 2019-07-25 DIAGNOSIS — Z7902 Long term (current) use of antithrombotics/antiplatelets: Secondary | ICD-10-CM | POA: Diagnosis not present

## 2019-07-25 DIAGNOSIS — Z87891 Personal history of nicotine dependence: Secondary | ICD-10-CM | POA: Diagnosis not present

## 2019-07-25 DIAGNOSIS — Z90710 Acquired absence of both cervix and uterus: Secondary | ICD-10-CM | POA: Insufficient documentation

## 2019-07-25 DIAGNOSIS — J449 Chronic obstructive pulmonary disease, unspecified: Secondary | ICD-10-CM | POA: Diagnosis present

## 2019-07-25 DIAGNOSIS — K219 Gastro-esophageal reflux disease without esophagitis: Secondary | ICD-10-CM | POA: Diagnosis not present

## 2019-07-25 DIAGNOSIS — Z803 Family history of malignant neoplasm of breast: Secondary | ICD-10-CM | POA: Insufficient documentation

## 2019-07-25 DIAGNOSIS — R55 Syncope and collapse: Secondary | ICD-10-CM | POA: Diagnosis not present

## 2019-07-25 DIAGNOSIS — F32A Depression, unspecified: Secondary | ICD-10-CM | POA: Diagnosis present

## 2019-07-25 DIAGNOSIS — Z885 Allergy status to narcotic agent status: Secondary | ICD-10-CM | POA: Insufficient documentation

## 2019-07-25 DIAGNOSIS — Z79899 Other long term (current) drug therapy: Secondary | ICD-10-CM | POA: Insufficient documentation

## 2019-07-25 DIAGNOSIS — I10 Essential (primary) hypertension: Secondary | ICD-10-CM | POA: Diagnosis not present

## 2019-07-25 DIAGNOSIS — F329 Major depressive disorder, single episode, unspecified: Secondary | ICD-10-CM | POA: Insufficient documentation

## 2019-07-25 DIAGNOSIS — Z89511 Acquired absence of right leg below knee: Secondary | ICD-10-CM | POA: Diagnosis not present

## 2019-07-25 DIAGNOSIS — R778 Other specified abnormalities of plasma proteins: Secondary | ICD-10-CM | POA: Insufficient documentation

## 2019-07-25 DIAGNOSIS — Z880 Allergy status to penicillin: Secondary | ICD-10-CM | POA: Insufficient documentation

## 2019-07-25 DIAGNOSIS — E1151 Type 2 diabetes mellitus with diabetic peripheral angiopathy without gangrene: Secondary | ICD-10-CM | POA: Diagnosis not present

## 2019-07-25 DIAGNOSIS — F1410 Cocaine abuse, uncomplicated: Secondary | ICD-10-CM

## 2019-07-25 DIAGNOSIS — I248 Other forms of acute ischemic heart disease: Secondary | ICD-10-CM | POA: Diagnosis not present

## 2019-07-25 DIAGNOSIS — R7989 Other specified abnormal findings of blood chemistry: Secondary | ICD-10-CM | POA: Diagnosis present

## 2019-07-25 DIAGNOSIS — F419 Anxiety disorder, unspecified: Secondary | ICD-10-CM | POA: Diagnosis not present

## 2019-07-25 DIAGNOSIS — Z7901 Long term (current) use of anticoagulants: Secondary | ICD-10-CM | POA: Insufficient documentation

## 2019-07-25 DIAGNOSIS — D500 Iron deficiency anemia secondary to blood loss (chronic): Secondary | ICD-10-CM | POA: Insufficient documentation

## 2019-07-25 LAB — CBC
HCT: 28.5 % — ABNORMAL LOW (ref 36.0–46.0)
HCT: 27.9 % — ABNORMAL LOW (ref 36.0–46.0)
HCT: 27.7 % — ABNORMAL LOW (ref 36.0–46.0)
HCT: 26.5 % — ABNORMAL LOW (ref 36.0–46.0)
Hemoglobin: 9.7 g/dL — ABNORMAL LOW (ref 12.0–15.0)
Hemoglobin: 9.8 g/dL — ABNORMAL LOW (ref 12.0–15.0)
Hemoglobin: 9.6 g/dL — ABNORMAL LOW (ref 12.0–15.0)
Hemoglobin: 9 g/dL — ABNORMAL LOW (ref 12.0–15.0)
MCH: 29.7 pg (ref 26.0–34.0)
MCH: 30.6 pg (ref 26.0–34.0)
MCH: 30 pg (ref 26.0–34.0)
MCH: 29.9 pg (ref 26.0–34.0)
MCHC: 34 g/dL (ref 30.0–36.0)
MCHC: 35.1 g/dL (ref 30.0–36.0)
MCHC: 34.7 g/dL (ref 30.0–36.0)
MCHC: 34 g/dL (ref 30.0–36.0)
MCV: 87.2 fL (ref 80.0–100.0)
MCV: 87.2 fL (ref 80.0–100.0)
MCV: 86.6 fL (ref 80.0–100.0)
MCV: 88 fL (ref 80.0–100.0)
Platelets: 327 10*3/uL (ref 150–400)
Platelets: 310 10*3/uL (ref 150–400)
Platelets: 304 10*3/uL (ref 150–400)
Platelets: 317 10*3/uL (ref 150–400)
RBC: 3.27 MIL/uL — ABNORMAL LOW (ref 3.87–5.11)
RBC: 3.2 MIL/uL — ABNORMAL LOW (ref 3.87–5.11)
RBC: 3.2 MIL/uL — ABNORMAL LOW (ref 3.87–5.11)
RBC: 3.01 MIL/uL — ABNORMAL LOW (ref 3.87–5.11)
RDW: 13.9 % (ref 11.5–15.5)
RDW: 13.8 % (ref 11.5–15.5)
RDW: 14 % (ref 11.5–15.5)
RDW: 13.9 % (ref 11.5–15.5)
WBC: 11.7 10*3/uL — ABNORMAL HIGH (ref 4.0–10.5)
WBC: 10.8 10*3/uL — ABNORMAL HIGH (ref 4.0–10.5)
WBC: 10 10*3/uL (ref 4.0–10.5)
WBC: 8.8 10*3/uL (ref 4.0–10.5)
nRBC: 0 % (ref 0.0–0.2)
nRBC: 0 % (ref 0.0–0.2)
nRBC: 0 % (ref 0.0–0.2)
nRBC: 0 % (ref 0.0–0.2)

## 2019-07-25 LAB — BASIC METABOLIC PANEL
Anion gap: 9 (ref 5–15)
BUN: 15 mg/dL (ref 8–23)
CO2: 25 mmol/L (ref 22–32)
Calcium: 9.3 mg/dL (ref 8.9–10.3)
Chloride: 104 mmol/L (ref 98–111)
Creatinine, Ser: 0.9 mg/dL (ref 0.44–1.00)
GFR calc Af Amer: >60 mL/min (ref >60)
GFR calc non Af Amer: >60 mL/min (ref >60)
Glucose, Bld: 219 mg/dL — ABNORMAL HIGH (ref 70–99)
Potassium: 4.1 mmol/L (ref 3.5–5.1)
Sodium: 138 mmol/L (ref 135–145)

## 2019-07-25 LAB — TROPONIN I (HIGH SENSITIVITY)
Troponin I (High Sensitivity): 29 ng/L — ABNORMAL HIGH (ref <18)
Troponin I (High Sensitivity): 224 ng/L (ref <18)
Troponin I (High Sensitivity): 270 ng/L (ref <18)
Troponin I (High Sensitivity): 207 ng/L (ref <18)

## 2019-07-25 LAB — PROTIME-INR
INR: 1 (ref 0.8–1.2)
Prothrombin Time: 12.8 seconds (ref 11.4–15.2)

## 2019-07-25 LAB — GLUCOSE, CAPILLARY: Glucose-Capillary: 217 mg/dL — ABNORMAL HIGH (ref 70–99)

## 2019-07-25 MED ORDER — INSULIN ASPART 100 UNIT/ML ~~LOC~~ SOLN
0.0000 [IU] | Freq: Three times a day (TID) | SUBCUTANEOUS | Status: DC
  Administered 2019-07-26: 2 [IU] via SUBCUTANEOUS
  Filled 2019-07-25: qty 1

## 2019-07-25 MED ORDER — DM-GUAIFENESIN ER 30-600 MG PO TB12
1.0000 | ORAL_TABLET | Freq: Two times a day (BID) | ORAL | Status: DC
  Administered 2019-07-25 – 2019-07-26 (×3): 1 via ORAL
  Filled 2019-07-25 (×3): qty 1

## 2019-07-25 MED ORDER — INSULIN ASPART 100 UNIT/ML ~~LOC~~ SOLN
0.0000 [IU] | Freq: Every day | SUBCUTANEOUS | Status: DC
  Administered 2019-07-25: 2 [IU] via SUBCUTANEOUS
  Filled 2019-07-25: qty 1

## 2019-07-25 MED ORDER — ACETAMINOPHEN 325 MG PO TABS: 650.0000 mg | ORAL_TABLET | Freq: Four times a day (QID) | ORAL | Status: DC | PRN

## 2019-07-25 MED ORDER — ALBUTEROL SULFATE (2.5 MG/3ML) 0.083% IN NEBU: 3.0000 mL | INHALATION_SOLUTION | RESPIRATORY_TRACT | Status: DC | PRN

## 2019-07-25 MED ORDER — PANTOPRAZOLE SODIUM 40 MG IV SOLR
40.0000 mg | Freq: Two times a day (BID) | INTRAVENOUS | Status: DC
  Administered 2019-07-25 – 2019-07-26 (×3): 40 mg via INTRAVENOUS
  Filled 2019-07-25 (×3): qty 40

## 2019-07-25 MED ORDER — ONDANSETRON HCL 4 MG/2ML IJ SOLN: 4.0000 mg | Freq: Three times a day (TID) | INTRAMUSCULAR | Status: DC | PRN

## 2019-07-25 MED ORDER — SODIUM CHLORIDE 0.9 % IV SOLN: INTRAVENOUS | Status: DC

## 2019-07-25 NOTE — Consult Note (Signed)
Cardiology Consultation:   Patient ID: Sylvia [name] MRN: [id number]; DOB: 1955-09-05  Admit date: 07/25/2019 Date of Consult: 07/25/2019  Primary Care Provider: [facility] Primary Cardiologist: New- Dr. Azucena Cecil rounding Primary Electrophysiologist:  None    Patient Profile:   Sylvia [name] is a 64 y.o. female with a hx of COPD, hypertension, hyperlipidemia, peripheral artery disease status post peripheral stents, right BKA who is being seen today for the evaluation of elevated troponins, syncope and elevated troponins at the request of Dr. Clyde Lundborg.  History of Present Illness:   Sylvia [name] 64 year old female with history of COPD, hypertension, hyperlipidemia, PAD status post iliac stents May 2020 on aspirin and Eliquis, right BKA who presents to the hospital after a syncopal event.  Patient was at home today sitting at the dining table.  She later on woke up with EMS and her grandson by her side.  She was told by her grandson she was unresponsive for about 45 seconds.  Her grandson did CPR for couple of seconds prior to EMS arrival.  Patient does not remember any symptoms prior to passing out.  She denies chest pain, shortness of breath.  She ambulates with a walker and denies any exertional chest pain or shortness of breath.  She denies any prior history of syncope.  She has a history of peripheral artery disease, had iliac stents placed in May 2020, at which point Eliquis, Plavix and aspirin was started by vascular surgery.  She denies history of PE or lower extremity DVT.  In the ED, EKG showed sinus rhythm, high-sensitivity troponins were 29, 224, 270.   Past Medical History:  Diagnosis Date  . Anemia of chronic disease 05/16/2019  . Anxiety    h/o  . COPD (chronic obstructive pulmonary disease) (HCC)   . Diabetes mellitus without complication (HCC)   . GERD (gastroesophageal reflux disease)   . Hypertension    bp under control-off meds since  2019    Past Surgical History:  Procedure Laterality Date  . ABDOMINAL HYSTERECTOMY    . AMPUTATION Right 12/27/2018   Procedure: AMPUTATION BELOW KNEE;  Surgeon: Annice Needy, MD;  Location: [facility] ORS;  Service: General;  Laterality: Right;  . CHOLECYSTECTOMY    . COLONOSCOPY WITH PROPOFOL N/A 04/16/2019   Procedure: COLONOSCOPY WITH PROPOFOL;  Surgeon: Pasty Spillers, MD;  Location: [facility] ENDOSCOPY;  Service: Endoscopy;  Laterality: N/A;  . ESOPHAGOGASTRODUODENOSCOPY (EGD) WITH PROPOFOL N/A 06/15/2018   Procedure: ESOPHAGOGASTRODUODENOSCOPY (EGD) WITH PROPOFOL;  Surgeon: Toledo, Boykin Nearing, MD;  Location: [facility] ENDOSCOPY;  Service: Gastroenterology;  Laterality: N/A;  . ESOPHAGOGASTRODUODENOSCOPY (EGD) WITH PROPOFOL N/A 01/03/2019   Procedure: ESOPHAGOGASTRODUODENOSCOPY (EGD) WITH PROPOFOL;  Surgeon: Midge Minium, MD;  Location: [facility] ENDOSCOPY;  Service: Endoscopy;  Laterality: N/A;  . GIVENS CAPSULE STUDY  04/16/2019   Procedure: GIVENS CAPSULE STUDY;  Surgeon: Pasty Spillers, MD;  Location: [facility] ENDOSCOPY;  Service: Endoscopy;;  . GIVENS CAPSULE STUDY N/A 06/25/2019   Procedure: GIVENS CAPSULE STUDY;  Surgeon: Wyline Mood, MD;  Location: [facility] ENDOSCOPY;  Service: Gastroenterology;  Laterality: N/A;  . LOWER EXTREMITY ANGIOGRAPHY Right 08/21/2018   Procedure: LOWER EXTREMITY ANGIOGRAPHY;  Surgeon: Annice Needy, MD;  Location: [facility] INVASIVE CV LAB;  Service: Cardiovascular;  Laterality: Right;  . LOWER EXTREMITY ANGIOGRAPHY Left 08/28/2018   Procedure: LOWER EXTREMITY ANGIOGRAPHY;  Surgeon: Annice Needy, MD;  Location: [facility] INVASIVE CV LAB;  Service: Cardiovascular;  Laterality: Left;  . LOWER EXTREMITY ANGIOGRAPHY Right 08/28/2018   Procedure: Lower  Extremity Angiography;  Surgeon: Annice Needy, MD;  Location[facility] CV LAB;  Service: Cardiovascular;  Laterality: Right;  . LOWER EXTREMITY ANGIOGRAPHY Right 12/18/2018   Procedure: Lower Extremity Angiography;  Surgeon: Annice Needy,  MD;  Location: [facility] INVASIVE CV LAB;  Service: Cardiovascular;  Laterality: Right;  . LOWER EXTREMITY ANGIOGRAPHY Right 12/18/2018   Procedure: Lower Extremity Angiography;  Surgeon: Annice Needy, MD;  Location: [facility] INVASIVE CV LAB;  Service: Cardiovascular;  Laterality: Right;  . LOWER EXTREMITY ANGIOGRAPHY Left 12/21/2018   Procedure: Lower Extremity Angiography;  Surgeon: Annice Needy, MD;  Location: [facility] INVASIVE CV LAB;  Service: Cardiovascular;  Laterality: Left;  . LOWER EXTREMITY ANGIOGRAPHY Right 12/21/2018   Procedure: Lower Extremity Angiography;  Surgeon: Annice Needy, MD;  Location: [facility] INVASIVE CV LAB;  Service: Cardiovascular;  Laterality: Right;  . LOWER EXTREMITY INTERVENTION N/A 12/22/2018   Procedure: LOWER EXTREMITY INTERVENTION;  Surgeon: Annice Needy, MD;  Location: [facility] INVASIVE CV LAB;  Service: Cardiovascular;  Laterality: N/A;     Home Medications:  Prior to Admission medications   Medication Sig Start Date End Date Taking? Authorizing Provider  acetaminophen (TYLENOL) 325 MG tablet Take 650 mg by mouth every 6 (six) hours as needed.   Yes [provider]  albuterol (PROVENTIL HFA;VENTOLIN HFA) 108 (90 Base) MCG/ACT inhaler Inhale 2 puffs into the lungs every 6 (six) hours as needed for wheezing or shortness of breath. 08/20/17  Yes Jeanmarie Plant, MD  apixaban (ELIQUIS) 2.5 MG TABS tablet Take 1 tablet (2.5 mg total) by mouth 2 (two) times daily. 01/30/19  Yes Georgiana Spinner, NP  aspirin 81 MG EC tablet Take 1 tablet (81 mg total) by mouth daily. 01/30/19  Yes Georgiana Spinner, NP  cetirizine (ZYRTEC) 10 MG tablet Take 10 mg by mouth daily.   Yes [provider]  clopidogrel (PLAVIX) 75 MG tablet Take 75 mg by mouth daily.   Yes [provider]  docusate sodium (COLACE) 100 MG capsule Take 100 mg by mouth 2 (two) times daily.   Yes [provider]  DULoxetine (CYMBALTA) 30 MG capsule Take 30 mg by mouth daily.   Yes [provider]  ferrous sulfate 325 (65 FE) MG tablet Take 325 mg by mouth daily.    Yes [provider]  gabapentin (NEURONTIN) 300 MG capsule Take 2 Capsules (600 mg) at Bedtime and 1 capsule (300 mg) in the afternoon 06/14/19  Yes Georgiana Spinner, NP  lisinopril (ZESTRIL) 5 MG tablet Take 1 tablet (5 mg total) by mouth daily. 01/30/19  Yes Georgiana Spinner, NP  Melatonin 1 MG TABS Take 1 tablet by mouth at bedtime.   Yes [provider]  metoprolol tartrate (LOPRESSOR) 50 MG tablet Take 1 tablet (50 mg total) by mouth 2 (two) times daily. 01/30/19  Yes Georgiana Spinner, NP  Multiple Vitamins-Minerals (WOMENS MULTIVITAMIN PO) Take 1 tablet by mouth daily.   Yes [provider]  pantoprazole (PROTONIX) 40 MG tablet Take 1 tablet (40 mg total) by mouth daily. Patient taking differently: Take 40 mg by mouth 2 (two) times daily.  06/17/18  Yes Ojie, Jude, MD  sitaGLIPtin (JANUVIA) 100 MG tablet Take 100 mg by mouth daily.   Yes [provider]    Inpatient Medications: Scheduled Meds: . dextromethorphan-guaiFENesin  1 tablet Oral BID  . insulin aspart  0-5 Units Subcutaneous QHS  . insulin aspart  0-9 Units Subcutaneous TID WC  .  pantoprazole (PROTONIX) IV  40 mg Intravenous Q12H   Continuous Infusions: . sodium chloride 75 mL/hr at 07/25/19 1428   PRN Meds: acetaminophen, albuterol, ondansetron (ZOFRAN) IV  Allergies:    Allergies  Allergen Reactions  . Metformin And Related Diarrhea  . Penicillins Hives    Has patient had a PCN reaction causing immediate rash, facial/tongue/throat swelling, SOB or lightheadedness with hypotension: Yes Has patient had a PCN reaction causing severe rash involving mucus membranes or skin necrosis: No Has patient had a PCN reaction that required hospitalization: No Has patient had a PCN reaction occurring within the last 10 years: No If all of the above answers are "NO", then may proceed with Cephalosporin use.  . Tramadol Itching     Social History:   Social History   Socioeconomic History  . Marital status: Divorced    Spouse name: Not on file  . Number of children: Not on file  . Years of education: Not on file  . Highest education level: Not on file  Occupational History  . Not on file  Tobacco Use  . Smoking status: Former Smoker    Packs/day: 0.25    Years: 45.00    Pack years: 11.25    Types: Cigarettes    Quit date: 08/28/2018    Years since quitting: 0.9  . Smokeless tobacco: Never Used  . Tobacco comment: quit  Substance and Sexual Activity  . Alcohol use: No  . Drug use: Never  . Sexual activity: Not Currently  Other Topics Concern  . Not on file  Social History Narrative  . Not on file   Social Determinants of Health   Financial Resource Strain:   . Difficulty of Paying Living Expenses:   Food Insecurity:   . Worried About Programme researcher, broadcasting/film/video in the Last Year:   . Barista in the Last Year:   Transportation Needs:   . Freight forwarder (Medical):   Marland Kitchen Lack of Transportation (Non-Medical):   Physical Activity:   . Days of Exercise per Week:   . Minutes of Exercise per Session:   Stress:   . Feeling of Stress :   Social Connections:   . Frequency of Communication with Friends and Family:   . Frequency of Social Gatherings with Friends and Family:   . Attends Religious Services:   . Active Member of Clubs or Organizations:   . Attends Banker Meetings:   Marland Kitchen Marital Status:   Intimate Partner Violence:   . Fear of Current or Ex-Partner:   . Emotionally Abused:   Marland Kitchen Physically Abused:   . Sexually Abused:     Family History:    Family History  Problem Relation Age of Onset  . Breast cancer Mother 36     ROS:  Please see the history of present illness.   All other ROS reviewed and negative.     Physical Exam/Data:   Vitals:   07/25/19 1400 07/25/19 1430 07/25/19 1500 07/25/19 1530  BP: (!) 148/82 133/60 130/68 120/64  Pulse: 86 77 74 78   Resp: 19 (!) 22 20 20   Temp:      TempSrc:      SpO2: 100% 99% 100% 99%  Weight:      Height:       No intake or output data in the 24 hours ending 07/25/19 1716 Last 3 Weights 07/25/2019 06/14/2019 05/29/2019  Weight (lbs) 130 lb 130 lb 136 lb 9.6 oz  Weight (  kg) 58.968 kg 58.968 kg 61.961 kg     Body mass index is 20.36 kg/m.  General:  Well nourished, well developed, in no acute distress HEENT: normal Lymph: no adenopathy Neck: no JVD Endocrine:  No thryomegaly Vascular: No carotid bruits; FA pulses 2+ bilaterally without bruits  Cardiac:  normal S1, S2; RRR; no murmur  Lungs:  clear to auscultation bilaterally, no wheezing, rhonchi or rales  Abd: soft, nontender, no hepatomegaly  Ext: no edema Musculoskeletal: Right BKA noted, left forearm cyst/swelling noted Skin: warm and dry  Neuro:  CNs 2-12 intact, no focal abnormalities noted Psych:  Normal affect   EKG:  The EKG was personally reviewed and demonstrates: Sinus rhythm Telemetry:  Telemetry was personally reviewed and demonstrates: Sinus rhythm    Laboratory Data:  High Sensitivity Troponin:   Recent Labs  Lab 07/25/19 0948 07/25/19 1202 07/25/19 1341  TROPONINIHS 29* 224* 270*     Chemistry Recent Labs  Lab 07/25/19 0948  NA 138  K 4.1  CL 104  CO2 25  GLUCOSE 219*  BUN 15  CREATININE 0.90  CALCIUM 9.3  GFRNONAA >60  GFRAA >60  ANIONGAP 9    No results for input(s): PROT, ALBUMIN, AST, ALT, ALKPHOS, BILITOT in the last 168 hours. Hematology Recent Labs  Lab 07/25/19 0948 07/25/19 1241 07/25/19 1341  WBC 11.7* 10.8* 10.0  RBC 3.27* 3.20* 3.20*  HGB 9.7* 9.8* 9.6*  HCT 28.5* 27.9* 27.7*  MCV 87.2 87.2 86.6  MCH 29.7 30.6 30.0  MCHC 34.0 35.1 34.7  RDW 13.9 13.8 14.0  PLT 327 310 304   BNPNo results for input(s): BNP, PROBNP in the last 168 hours.  DDimer No results for input(s): DDIMER in the last 168 hours.   Radiology/Studies:  DG Chest Port 1 View  Result Date:  07/25/2019 CLINICAL DATA:  Syncope EXAM: PORTABLE CHEST 1 VIEW COMPARISON:  Chest radiograph dated 08/20/2017 FINDINGS: The heart size and mediastinal contours are within normal limits. Chronic peribronchial cuffing is not significantly changed. No focal consolidation, pleural effusion or pneumothorax. The visualized skeletal structures are unremarkable. IMPRESSION: Findings suggestive of chronic bronchitis. Electronically Signed   By: Zerita Boers M.D.   On: 07/25/2019 10:10   {   Assessment and Plan:   1.  Elevated troponins -Nonspecific, no symptoms of chest pain or shortness of breath.  Likely demand ischemia. -Continue to trend until peak. I do not think patient is having an ACS. -Get echocardiogram to evaluate structural abnormalities. -If echocardiogram is normal, can consider outpatient stress test.  2.  Syncope -Sudden, cannot rule out cardiac versus vasovagal -Recommend syncope work-up as per primary team including carotid ultrasound. -Get echocardiogram -Monitor on telemetry for any arrhythmias.  3.  History of PAD status post stents, right BKA -Continue PTA statin, aspirin Plavix Eliquis.   Signed, Kate Sable, MD  07/25/2019 5:16 PM

## 2019-07-25 NOTE — ED Triage Notes (Signed)
Pt from home via [facility]. Per EMS, pt pt had a syncopal episode, c/o dizziness post syncope. St hx of GI bleed fot the past 2 months and receives blood transfusions once a month for "anemia'. Last blood transfusion received at "last month at the cancer center". EDp Williams at bedside upon arrival.

## 2019-07-25 NOTE — ED Provider Notes (Signed)
[facility] Emergency Department Provider Note       Time seen: ----------------------------------------- 9:42 AM on 07/25/2019 -----------------------------------------   I have reviewed the triage vital signs and the nursing notes.  HISTORY   Chief Complaint Loss of Consciousness    HPI Sylvia [name] is a 64 y.o. female with a history of anemia, anxiety, COPD, GERD, hypertension, diabetes who presents to the ED for syncope.  Reportedly she had a syncopal event this morning, has been having ongoing rectal bleeding with no specific diagnosis.  Last received blood about a month ago.  She has not eaten yet this morning.  She denies fevers, chills or other complaints.  Past Medical History:  Diagnosis Date  . Anemia of chronic disease 05/16/2019  . Anxiety    h/o  . COPD (chronic obstructive pulmonary disease) (HCC)   . Diabetes mellitus without complication (HCC)   . GERD (gastroesophageal reflux disease)   . Hypertension    bp under control-off meds since 2019    Patient Active Problem List   Diagnosis Date Noted  . Anemia of chronic disease 05/16/2019  . Dizziness   . Polyp of colon   . Acute upper GI bleeding 04/13/2019  . Anemia associated with acute blood loss 04/13/2019  . Chronic anticoagulation 04/13/2019  . 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 Symptomatic anemia 04/13/2019  . Upper GI bleed 04/13/2019  . Acute GI bleeding 04/13/2019  . 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 Melena   . DU (duodenal ulcer)   . DKA (diabetic ketoacidoses) (HCC) 12/14/2018  . Atherosclerosis of artery of extremity with rest pain (HCC) 08/28/2018  . PAD (peripheral artery disease) (HCC) 07/14/2018  . Abdominal pain 06/13/2018  . HTN (hypertension) 06/13/2018  . Non-insulin dependent type 2 diabetes mellitus (HCC) 06/13/2018  . Elevated lactic acid level 06/13/2018    Past Surgical History:  Procedure Laterality Date   . ABDOMINAL HYSTERECTOMY    . AMPUTATION Right 12/27/2018   Procedure: AMPUTATION BELOW KNEE;  Surgeon: Annice Needy, MD;  Location: [facility] ORS;  Service: General;  Laterality: Right;  . CHOLECYSTECTOMY    . COLONOSCOPY WITH PROPOFOL N/A 04/16/2019   Procedure: COLONOSCOPY WITH PROPOFOL;  Surgeon: Pasty Spillers, MD;  Location: [facility] ENDOSCOPY;  Service: Endoscopy;  Laterality: N/A;  . ESOPHAGOGASTRODUODENOSCOPY (EGD) WITH PROPOFOL N/A 06/15/2018   Procedure: ESOPHAGOGASTRODUODENOSCOPY (EGD) WITH PROPOFOL;  Surgeon: Toledo, Boykin Nearing, MD;  Location: [facility] ENDOSCOPY;  Service: Gastroenterology;  Laterality: N/A;  . ESOPHAGOGASTRODUODENOSCOPY (EGD) WITH PROPOFOL N/A 01/03/2019   Procedure: ESOPHAGOGASTRODUODENOSCOPY (EGD) WITH PROPOFOL;  Surgeon: Midge Minium, MD;  Location: [facility] ENDOSCOPY;  Service: Endoscopy;  Laterality: N/A;  . GIVENS CAPSULE STUDY  04/16/2019   Procedure: GIVENS CAPSULE STUDY;  Surgeon: Pasty Spillers, MD;  Location: [facility] ENDOSCOPY;  Service: Endoscopy;;  . GIVENS CAPSULE STUDY N/A 06/25/2019   Procedure: GIVENS CAPSULE STUDY;  Surgeon: Wyline Mood, MD;  Location: [facility] ENDOSCOPY;  Service: Gastroenterology;  Laterality: N/A;  . LOWER EXTREMITY ANGIOGRAPHY Right 08/21/2018   Procedure: LOWER EXTREMITY ANGIOGRAPHY;  Surgeon: Annice Needy, MD;  Location: [facility] INVASIVE CV LAB;  Service: Cardiovascular;  Laterality: Right;  . LOWER EXTREMITY ANGIOGRAPHY Left 08/28/2018   Procedure: LOWER EXTREMITY ANGIOGRAPHY;  Surgeon: Annice Needy, MD;  Location: [facility] INVASIVE CV LAB;  Service: Cardiovascular;  Laterality: Left;  . LOWER EXTREMITY ANGIOGRAPHY Right 08/28/2018   Procedure: Lower Extremity Angiography;  Surgeon: Annice Needy, MD;  Location: [facility] INVASIVE  CV LAB;  Service: Cardiovascular;  Laterality: Right;  . LOWER EXTREMITY ANGIOGRAPHY Right 12/18/2018   Procedure: Lower Extremity Angiography;  Surgeon: Annice Needy, MD;  Location: [facility] INVASIVE CV LAB;  Service: Cardiovascular;   Laterality: Right;  . LOWER EXTREMITY ANGIOGRAPHY Right 12/18/2018   Procedure: Lower Extremity Angiography;  Surgeon: Annice Needy, MD;  Location: [facility] INVASIVE CV LAB;  Service: Cardiovascular;  Laterality: Right;  . LOWER EXTREMITY ANGIOGRAPHY Left 12/21/2018   Procedure: Lower Extremity Angiography;  Surgeon: Annice Needy, MD;  Location: [facility] INVASIVE CV LAB;  Service: Cardiovascular;  Laterality: Left;  . LOWER EXTREMITY ANGIOGRAPHY Right 12/21/2018   Procedure: Lower Extremity Angiography;  Surgeon: Annice Needy, MD;  Location: [facility] INVASIVE CV LAB;  Service: Cardiovascular;  Laterality: Right;  . LOWER EXTREMITY INTERVENTION N/A 12/22/2018   Procedure: LOWER EXTREMITY INTERVENTION;  Surgeon: Annice Needy, MD;  Location: [facility] INVASIVE CV LAB;  Service: Cardiovascular;  Laterality: N/A;    Allergies Metformin and related, Penicillins, and Tramadol  Social History Social History   Tobacco Use  . Smoking status: Former Smoker    Packs/day: 0.25    Years: 45.00    Pack years: 11.25    Types: Cigarettes    Quit date: 08/28/2018    Years since quitting: 0.9  . Smokeless tobacco: Never Used  . Tobacco comment: quit  Substance Use Topics  . Alcohol use: No  . Drug use: Never    Review of Systems Constitutional: Negative for fever. Cardiovascular: Negative for chest pain. Respiratory: Negative for shortness of breath. Gastrointestinal: Negative for abdominal pain, vomiting and diarrhea.  Positive for rectal bleeding Musculoskeletal: Negative for back pain. Skin: Negative for rash. Neurological: Negative for headaches, focal weakness or numbness.  All systems negative/normal/unremarkable except as stated in the HPI  ____________________________________________   PHYSICAL EXAM:  VITAL SIGNS: ED Triage Vitals  Enc Vitals Group     BP      Pulse      Resp      Temp      Temp src      SpO2      Weight      Height      Head Circumference      Peak Flow      Pain Score       Pain Loc      Pain Edu?      Excl. in GC?     Constitutional: Alert and oriented. Well appearing and in no distress. Eyes: Conjunctivae are normal. Normal extraocular movements. Cardiovascular: Normal rate, regular rhythm. No murmurs, rubs, or gallops. Respiratory: Normal respiratory effort without tachypnea nor retractions. Breath sounds are clear and equal bilaterally. No wheezes/rales/rhonchi. Gastrointestinal: Soft and nontender. Normal bowel sounds Musculoskeletal: Nontender with normal range of motion in extremities. No lower extremity tenderness nor edema. Neurologic:  Normal speech and language. No gross focal neurologic deficits are appreciated.  Skin:  Skin is warm, dry and intact. No rash noted. Psychiatric: Mood and affect are normal. Speech and behavior are normal.  ____________________________________________  EKG: Interpreted by me.  Sinus rhythm with a rate of 82 bpm, normal axis, borderline long QT  Repeat EKG interpreted by me, sinus rhythm with rate of 75 bpm, PR interval, normal axis, normal QT  ____________________________________________  ED COURSE:  As part of my medical decision making, I reviewed the following data within the electronic MEDICAL RECORD NUMBER History obtained from family if available, nursing notes, old chart and ekg, as  well as notes from prior ED visits. Patient presented for syncope and rectal bleeding, we will assess with labs and imaging as indicated at this time.   Procedures  Sylvia [name] was evaluated in Emergency Department on 07/25/2019 for the symptoms described in the history of present illness. She was evaluated in the context of the global COVID-19 pandemic, which necessitated consideration that the patient might be at risk for infection with the SARS-CoV-2 virus that causes COVID-19. Institutional protocols and algorithms that pertain to the evaluation of patients at risk for COVID-19 are in a state of rapid change based on  information released by regulatory bodies including the CDC and federal and state organizations. These policies and algorithms were followed during the patient's care in the ED.  ____________________________________________   LABS (pertinent positives/negatives)  Labs Reviewed  BASIC METABOLIC PANEL - Abnormal; Notable for the following components:      Result Value   Glucose, Bld 219 (*)    All other components within normal limits  CBC - Abnormal; Notable for the following components:   WBC 11.7 (*)    RBC 3.27 (*)    Hemoglobin 9.7 (*)    HCT 28.5 (*)    All other components within normal limits  TROPONIN I (HIGH SENSITIVITY) - Abnormal; Notable for the following components:   Troponin I (High Sensitivity) 29 (*)    All other components within normal limits  TROPONIN I (HIGH SENSITIVITY) - Abnormal; Notable for the following components:   Troponin I (High Sensitivity) 224 (*)    All other components within normal limits  PROTIME-INR  CBC    RADIOLOGY Images were viewed by me  Chest x-ray IMPRESSION: Findings suggestive of chronic bronchitis. ____________________________________________   DIFFERENTIAL DIAGNOSIS   Anemia, arrhythmia, MI, gastrointestinal bleeding, dehydration, renal failure, electrolyte abnormality, hyperglycemia, hypoglycemia  FINAL ASSESSMENT AND PLAN  Syncope, rectal bleeding, elevated troponin   Plan: The patient had presented for syncope and rectal bleeding. Patient's labs initially were unremarkable but subsequent troponin was significantly increased compared to the initial. Patient's imaging not reveal any acute process.  She denies any complaints at this time but given her elevated troponin, I will discuss with the hospitalist for observation.   Laurence Aly, MD    Note: This note was generated in part or whole with voice recognition software. Voice recognition is usually quite accurate but there are transcription errors that can and  very often do occur. I apologize for any typographical errors that were not detected and corrected.     Earleen Newport, MD 07/25/19 [phone]

## 2019-07-25 NOTE — ED Notes (Signed)
Date and time results received: 04/07/211252 (use smartphrase ".now" to insert current time)  Test: trop Critical Value: 224  Name of Provider Notified: [place]  Orders Received? Or Actions Taken?: Actions Taken: MD Mayford Knife at bedside.

## 2019-07-25 NOTE — H&P (Signed)
History and Physical    Sylvia [name] VEL:[id number] DOB: 01/05/1956 DOA: 07/25/2019  Referring MD/NP/PA:   PCP: [facility]   Patient coming from:  The patient is coming from home.  At baseline, pt is independent for most of ADL.        Chief Complaint: syncope  HPI: Sylvia [name] is a 64 y.o. female with medical history significant of hypertension, hyperlipidemia, diabetes mellitus, COPD, GERD, depression, anemia, GI bleeding, PAD, duodenal ulcer, s/p of right BKA, who presents with syncope.  Patient states that he had an episode of syncope event this morning. After that, she developed dizziness.  No unilateral numbness or tingling in extremities.  No facial droop or slurred speech.  Patient does not have chest pain, shortness of breath, cough.  No fever or chills. Pt states that she has been having ongoing rectal bleeding with no specific diagnosis. She had blood transfusion and last time she received blood was about a month ago.  Patient does not have nausea vomiting or abdominal pain.  ED Course: pt was found to have hemoglobin 9.7 (7.9 on 06/13/2019), pending COVID-19 PCR, WBC 11.7, troponin 29 -->224, electrolytes renal function okay, temperature normal, blood pressure 107/67, heart rate 88, RR 18, oxygen saturation 100% on room air.  Chest x-ray showed chronic bronchitis change.  Patient is placed on progressive bed for observation.  Review of Systems:   General: no fevers, chills, no body weight gain, has fatigue HEENT: no blurry vision, hearing changes or sore throat Respiratory: no dyspnea, coughing, wheezing CV: no chest pain, no palpitations GI: no nausea, vomiting, abdominal pain, diarrhea, constipation. Has rectal bleeding. GU: no dysuria, burning on urination, increased urinary frequency, hematuria  Ext: no leg edema. s/p of right BKA Neuro: no unilateral weakness, numbness, or tingling, no vision change or hearing loss Skin: no rash, no  skin tear. MSK: No muscle spasm, no deformity, no limitation of range of movement in spin Heme: No easy bruising.  Travel history: No recent long distant travel.  Allergy:  Allergies  Allergen Reactions  . Metformin And Related Diarrhea  . Penicillins Hives    Has patient had a PCN reaction causing immediate rash, facial/tongue/throat swelling, SOB or lightheadedness with hypotension: Yes Has patient had a PCN reaction causing severe rash involving mucus membranes or skin necrosis: No Has patient had a PCN reaction that required hospitalization: No Has patient had a PCN reaction occurring within the last 10 years: No If all of the above answers are "NO", then may proceed with Cephalosporin use.  . Tramadol Itching    Past Medical History:  Diagnosis Date  . Anemia of chronic disease 05/16/2019  . Anxiety    h/o  . COPD (chronic obstructive pulmonary disease) (HCC)   . Diabetes mellitus without complication (HCC)   . GERD (gastroesophageal reflux disease)   . Hypertension    bp under control-off meds since 2019    Past Surgical History:  Procedure Laterality Date  . ABDOMINAL HYSTERECTOMY    . AMPUTATION Right 12/27/2018   Procedure: AMPUTATION BELOW KNEE;  Surgeon: Annice Needy, MD;  Location: [facility] ORS;  Service: General;  Laterality: Right;  . CHOLECYSTECTOMY    . COLONOSCOPY WITH PROPOFOL N/A 04/16/2019   Procedure: COLONOSCOPY WITH PROPOFOL;  Surgeon: Pasty Spillers, MD;  Location: [facility] ENDOSCOPY;  Service: Endoscopy;  Laterality: N/A;  . ESOPHAGOGASTRODUODENOSCOPY (EGD) WITH PROPOFOL N/A 06/15/2018   Procedure: ESOPHAGOGASTRODUODENOSCOPY (EGD) WITH PROPOFOL;  Surgeon: Norma Fredrickson, Boykin Nearing, MD;  Location: [facility] ENDOSCOPY;  Service: Gastroenterology;  Laterality: N/A;  . ESOPHAGOGASTRODUODENOSCOPY (EGD) WITH PROPOFOL N/A 01/03/2019   Procedure: ESOPHAGOGASTRODUODENOSCOPY (EGD) WITH PROPOFOL;  Surgeon: Midge Minium, MD;  Location: [facility] ENDOSCOPY;  Service: Endoscopy;   Laterality: N/A;  . GIVENS CAPSULE STUDY  04/16/2019   Procedure: GIVENS CAPSULE STUDY;  Surgeon: Pasty Spillers, MD;  Location: [facility] ENDOSCOPY;  Service: Endoscopy;;  . GIVENS CAPSULE STUDY N/A 06/25/2019   Procedure: GIVENS CAPSULE STUDY;  Surgeon: Wyline Mood, MD;  Location: [facility];  Service: Gastroenterology;  Laterality: N/A;  . LOWER EXTREMITY ANGIOGRAPHY Right 08/21/2018   Procedure: LOWER EXTREMITY ANGIOGRAPHY;  Surgeon: Annice Needy, MD;  Location: [facility] INVASIVE CV LAB;  Service: Cardiovascular;  Laterality: Right;  . LOWER EXTREMITY ANGIOGRAPHY Left 08/28/2018   Procedure: LOWER EXTREMITY ANGIOGRAPHY;  Surgeon: Annice Needy, MD;  Location: [facility] CV [facility];  Service: Cardiovascular;  Laterality: Left;  . LOWER EXTREMITY ANGIOGRAPHY Right 08/28/2018   Procedure: Lower Extremity Angiography;  Surgeon: Annice Needy, MD;  Location: [facility];  Service: Cardiovascular;  Laterality: Right;  . LOWER EXTREMITY ANGIOGRAPHY Right 12/18/2018   Procedure: Lower Extremity Angiography;  Surgeon: Annice Needy, MD;  Location: [facility];  Service: Cardiovascular;  Laterality: Right;  . LOWER EXTREMITY ANGIOGRAPHY Right 12/18/2018   Procedure: Lower Extremity Angiography;  Surgeon: Annice Needy, MD;  Location: [facility];  Service: Cardiovascular;  Laterality: Right;  . LOWER EXTREMITY ANGIOGRAPHY Left 12/21/2018   Procedure: Lower Extremity Angiography;  Surgeon: Annice Needy, MD;  Location: [facility];  Service: Cardiovascular;  Laterality: Left;  . LOWER EXTREMITY ANGIOGRAPHY Right 12/21/2018   Procedure: Lower Extremity Angiography;  Surgeon: Annice Needy, MD;  Location: [facility];  Service: Cardiovascular;  Laterality: Right;  . LOWER EXTREMITY INTERVENTION N/A 12/22/2018   Procedure: LOWER EXTREMITY INTERVENTION;  Surgeon: Annice Needy, MD;  Location: [facility] INV[facility] CV LAB;  Service: Cardiovascular;  Laterality: N/A;    Social History:   reports that she quit smoking about 10 months ago. Her smoking use included cigarettes. She has a 11.25 pack-year smoking history. She has never used smokeless tobacco. She reports that she does not drink alcohol or use drugs.  Family History:  Family History  Problem Relation Age of Onset  . Breast cancer Mother 55     Prior to Admission medications   Medication Sig Start Date End Date Taking? Authorizing Provider  acetaminophen (TYLENOL) 325 MG tablet Take 650 mg by mouth every 6 (six) hours as needed.    [provider]  albuterol (PROVENTIL HFA;VENTOLIN HFA) 108 (90 Base) MCG/ACT inhaler Inhale 2 puffs into the lungs every 6 (six) hours as needed for wheezing or shortness of breath. 08/20/17   Jeanmarie Plant, MD  apixaban (ELIQUIS) 2.5 MG TABS tablet Take 1 tablet (2.5 mg total) by mouth 2 (two) times daily. 01/30/19   Georgiana Spinner, NP  aspirin 81 MG EC tablet Take 1 tablet (81 mg total) by mouth daily. 01/30/19   Georgiana Spinner, NP  atorvastatin (LIPITOR) 10 MG tablet Take 10 mg by mouth every morning.    [provider]  cetirizine (ZYRTEC) 10 MG tablet Take 10 mg by mouth daily.    [provider]  clopidogrel (PLAVIX) 75 MG tablet Take 75 mg by mouth daily.    [provider]  docusate sodium (COLACE) 100 MG capsule Take 100 mg by mouth 2 (two) times  daily.    [provider]  DULoxetine (CYMBALTA) 30 MG capsule Take 30 mg by mouth daily.    [provider]  ferrous sulfate 324 MG TBEC Take 324 mg by mouth daily.    [provider]  gabapentin (NEURONTIN) 300 MG capsule Take 2 Capsules (600 mg) at Bedtime and 1 capsule (300 mg) in the afternoon 06/14/19   Georgiana Spinner, NP  lisinopril (ZESTRIL) 5 MG tablet Take 1 tablet (5 mg total) by mouth daily. 01/30/19   Georgiana Spinner, NP  Melatonin 1 MG TABS Take 1 tablet by mouth at bedtime.    [provider]  metoprolol tartrate (LOPRESSOR) 50 MG tablet Take 1  tablet (50 mg total) by mouth 2 (two) times daily. 01/30/19   Georgiana Spinner, NP  Multiple Vitamins-Minerals (WOMENS MULTIVITAMIN PO) Take 1 tablet by mouth daily.    [provider]  pantoprazole (PROTONIX) 40 MG tablet Take 1 tablet (40 mg total) by mouth daily. Patient taking differently: Take 40 mg by mouth daily. Dennie Bible reports taking twice daily. 06/17/18   Enid Baas, Jude, MD  sitaGLIPtin (JANUVIA) 100 MG tablet Take 100 mg by mouth daily.    [provider]  vitamin C (ASCORBIC ACID) 500 MG tablet Take 500 mg by mouth daily.    [provider]    Physical Exam: Vitals:   07/25/19 1400 07/25/19 1430 07/25/19 1500 07/25/19 1530  BP: (!) 148/82 133/60 130/68 120/64  Pulse: 86 77 74 78  Resp: 19 (!) 22 20 20   Temp:      TempSrc:      SpO2: 100% 99% 100% 99%  Weight:      Height:       General: Not in acute distress HEENT:       Eyes: PERRL, EOMI, no scleral icterus.       ENT: No discharge from the ears and nose, no pharynx injection, no tonsillar enlargement.        Neck: No JVD, no bruit, no mass felt. Heme: No neck lymph node enlargement. Cardiac: S1/S2, RRR, No murmurs, No gallops or rubs. Respiratory: No rales, wheezing, rhonchi or rubs. GI: Soft, nondistended, nontender, no rebound pain, no organomegaly, BS present. GU: No hematuria Ext: No pitting leg edema bilaterally. 2+DP/PT pulse bilaterally.  s/p of right BKA Musculoskeletal: No joint deformities, No joint redness or warmth, no limitation of ROM in spin. Skin: No rashes.  Neuro: Alert, oriented X3, cranial nerves II-XII grossly intact, moves all extremities normally. Muscle strength 5/5 in all extremities, sensation to light touch intact.  Psych: Patient is not psychotic, no suicidal or hemocidal ideation.  Labs on Admission: I have personally reviewed following labs and imaging studies  CBC: Recent Labs  Lab 07/25/19 0948 07/25/19 1241 07/25/19 1341  WBC 11.7* 10.8* 10.0  HGB 9.7* 9.8*  9.6*  HCT 28.5* 27.9* 27.7*  MCV 87.2 87.2 86.6  PLT 327 310 304   Basic Metabolic Panel: Recent Labs  Lab 07/25/19 0948  NA 138  K 4.1  CL 104  CO2 25  GLUCOSE 219*  BUN 15  CREATININE 0.90  CALCIUM 9.3   GFR: Estimated Creatinine Clearance: 58.8 mL/min (by C-G formula based on SCr of 0.9 mg/dL). Liver Function Tests: No results for input(s): AST, ALT, ALKPHOS, BILITOT, PROT, ALBUMIN in the last 168 hours. No results for input(s): LIPASE, AMYLASE in the last 168 hours. No results for input(s): AMMONIA in the last 168 hours. Coagulation Profile: Recent Labs  Lab 07/25/19 0948  INR 1.0   Cardiac Enzymes: No results for input(s): CKTOTAL, CKMB, CKMBINDEX, TROPONINI in the last 168 hours. BNP (last 3 results) No results for input(s): PROBNP in the last 8760 hours. HbA1C: No results for input(s): HGBA1C in the last 72 hours. CBG: No results for input(s): GLUCAP in the last 168 hours. Lipid Profile: No results for input(s): CHOL, HDL, LDLCALC, TRIG, CHOLHDL, LDLDIRECT in the last 72 hours. Thyroid Function Tests: No results for input(s): TSH, T4TOTAL, FREET4, T3FREE, THYROIDAB in the last 72 hours. Anemia Panel: No results for input(s): VITAMINB12, FOLATE, FERRITIN, TIBC, IRON, RETICCTPCT in the last 72 hours. Urine analysis:    Component Value Date/Time   COLORURINE YELLOW (A) 05/16/2019 1203   APPEARANCEUR HAZY (A) 05/16/2019 1203   LABSPEC 1.013 05/16/2019 1203   PHURINE 5.0 05/16/2019 1203   GLUCOSEU >=500 (A) 05/16/2019 1203   HGBUR NEGATIVE 05/16/2019 [place] 05/16/2019 [place] 05/16/2019 1203   PROTEINUR NEGATIVE 05/16/2019 1203   NITRITE NEGATIVE 05/16/2019 1203   LEUKOCYTESUR NEGATIVE 05/16/2019 1203   Sepsis Labs: @LABRCNTIP (procalcitonin:4,lacticidven:4) )No results found for this or any previous visit (from the past 240 hour(s)).   Radiological Exams on Admission: DG Chest Port 1 View  Result Date:  07/25/2019 CLINICAL DATA:  Syncope EXAM: PORTABLE CHEST 1 VIEW COMPARISON:  Chest radiograph dated 08/20/2017 FINDINGS: The heart size and mediastinal contours are within normal limits. Chronic peribronchial cuffing is not significantly changed. No focal consolidation, pleural effusion or pneumothorax. The visualized skeletal structures are unremarkable. IMPRESSION: Findings suggestive of chronic bronchitis. Electronically Signed   By: Zerita Boers M.D.   On: 07/25/2019 10:10     EKG: Independently reviewed.  Sinus rhythm, QTC 487, nonspecific T wave change   Assessment/Plan Principal Problem:   Syncope Active Problems:   HTN (hypertension)   Anemia associated with acute blood loss   Hx of right BKA (HCC)   Diabetes mellitus without complication (HCC)   GERD (gastroesophageal reflux disease)   COPD (chronic obstructive pulmonary disease) (HCC)   GIB (gastrointestinal bleeding)   HLD (hyperlipidemia)   Depression   Elevated troponin   Syncope: Etiology is not clear. The differential diagnosis is broad, including vasovagal syncope, seizure, TIA/stroke, ACS, drug abuse, orthostatic status.  - Place on tele bed for obs - Orthostatic vital signs  - MRI-brain - 2d echo - Neuro checks  - IVF: NS 75 cc/h  HTN:  -Continue home medications: Lisinopril, metoprolol  Anemia associated with acute blood loss: -continue iron supplement  Diabetes mellitus without complication ([place]): Most recent A1c  5.7, well controled. Patient is taking Januvia at home -SSI  GERD (gastroesophageal reflux disease): -on iv Protonix  COPD (chronic obstructive pulmonary disease) ([place]): stable -Bronchodilators  GIB (gastrointestinal bleeding): Patient has chronic rectal bleeding.  Patient is being followed by GI as outpatient.  Hemoglobin 7.9 on 06/13/2019 --> 9.7 --> 9.8 --> 9.6.  -Hold aspirin, Plavix, Eliquis -IV Protonix 40 mg twice daily -Follow-up CBC every 6 hours  HLD  (hyperlipidemia) -Lipitor  Depression: Stable -Cymbalta  Elevated troponin: Troponin 29, 224, 270. No CP.  Card, Dr. Garen Lah is consulted --> possible demand ischemia. -Check A1c, FLP -Trend troponin -Labs 2D echo -lipitor      DVT ppx: SCD Code Status: Full code Family Communication: not done, no family member is at bed side.     Disposition Plan:  Anticipate discharge back to previous home environment Consults called: Dr. Garen Lah Admission status: progressive  bed for obs    Date of Service 07/25/2019    Lorretta Harp [facility]   If 7PM-7AM, please contact night-coverage [email or web address] 07/25/2019, 6:18 PM

## 2019-07-25 NOTE — ED Notes (Signed)
Pt provided with [place] sandwich tray and chocolate ice cream and gingerale on ice. Pt is not requesting anything else at this time. Call light in reach, wil continue to monitor.

## 2019-07-25 NOTE — ED Notes (Signed)
EDP Williams at bedside.  

## 2019-07-25 NOTE — ED Notes (Signed)
Pt provide with lunch tray and water.

## 2019-07-25 NOTE — ED Notes (Signed)
Pt called grandson who witnessed syncopal episode. Grandson st "I found her laying on her stomach". Pt c/o of a HA a this time.

## 2019-07-25 NOTE — ED Notes (Signed)
This RN received phone number from "best friend" [place] [phone]. Pt provided with message.

## 2019-07-25 NOTE — ED Notes (Signed)
Pt states she wants to speak to dr due to desire to go home. Will reach out and request provider to come see pt and speak.

## 2019-07-25 NOTE — ED Notes (Signed)
Robin from patient placement called and informed me of the fact they pulled the bed back on room 16 per supervisor due to staffing  [id number]

## 2019-07-25 NOTE — ED Notes (Signed)
MD Agbor at bedside

## 2019-07-25 NOTE — ED Notes (Signed)
MD Xilin at beside.

## 2019-07-25 NOTE — ED Notes (Signed)
ED Provider [place] at bedside.

## 2019-07-25 NOTE — ED Notes (Signed)
Pt able to use toilet to void with this RN assistance.

## 2019-07-25 NOTE — ED Notes (Signed)
Pt assisted to bedside toilet at this time, patient tolerated pivoting from bed to toilet well with this RN assisting. Pt back in bed and hooked up to monitor, will continue to monitor.

## 2019-07-26 ENCOUNTER — Other Ambulatory Visit: Payer: Self-pay

## 2019-07-26 ENCOUNTER — Observation Stay: Payer: Medicaid Other

## 2019-07-26 ENCOUNTER — Telehealth: Payer: Self-pay | Admitting: *Deleted

## 2019-07-26 ENCOUNTER — Encounter: Payer: Self-pay | Admitting: Oncology

## 2019-07-26 ENCOUNTER — Observation Stay (HOSPITAL_BASED_OUTPATIENT_CLINIC_OR_DEPARTMENT_OTHER)
Admit: 2019-07-26 | Discharge: 2019-07-26 | Disposition: A | Discharge status: Home / Self Care | Payer: Medicaid Other | Attending: Internal Medicine | Admitting: Internal Medicine

## 2019-07-26 DIAGNOSIS — I340 Nonrheumatic mitral (valve) insufficiency: Secondary | ICD-10-CM | POA: Diagnosis not present

## 2019-07-26 DIAGNOSIS — I10 Essential (primary) hypertension: Secondary | ICD-10-CM | POA: Diagnosis not present

## 2019-07-26 DIAGNOSIS — K219 Gastro-esophageal reflux disease without esophagitis: Secondary | ICD-10-CM

## 2019-07-26 DIAGNOSIS — E119 Type 2 diabetes mellitus without complications: Secondary | ICD-10-CM

## 2019-07-26 DIAGNOSIS — J432 Centrilobular emphysema: Secondary | ICD-10-CM

## 2019-07-26 DIAGNOSIS — F1410 Cocaine abuse, uncomplicated: Secondary | ICD-10-CM

## 2019-07-26 DIAGNOSIS — R778 Other specified abnormalities of plasma proteins: Secondary | ICD-10-CM | POA: Diagnosis not present

## 2019-07-26 DIAGNOSIS — D62 Acute posthemorrhagic anemia: Secondary | ICD-10-CM | POA: Diagnosis not present

## 2019-07-26 DIAGNOSIS — I361 Nonrheumatic tricuspid (valve) insufficiency: Secondary | ICD-10-CM

## 2019-07-26 DIAGNOSIS — R55 Syncope and collapse: Secondary | ICD-10-CM | POA: Diagnosis not present

## 2019-07-26 DIAGNOSIS — D649 Anemia, unspecified: Secondary | ICD-10-CM

## 2019-07-26 DIAGNOSIS — E785 Hyperlipidemia, unspecified: Secondary | ICD-10-CM

## 2019-07-26 DIAGNOSIS — R7989 Other specified abnormal findings of blood chemistry: Secondary | ICD-10-CM

## 2019-07-26 LAB — MAGNESIUM: Magnesium: 1.6 mg/dL — ABNORMAL LOW (ref 1.7–2.4)

## 2019-07-26 LAB — BASIC METABOLIC PANEL
Anion gap: 7 (ref 5–15)
BUN: 12 mg/dL (ref 8–23)
CO2: 22 mmol/L (ref 22–32)
Calcium: 9 mg/dL (ref 8.9–10.3)
Chloride: 112 mmol/L — ABNORMAL HIGH (ref 98–111)
Creatinine, Ser: 0.66 mg/dL (ref 0.44–1.00)
GFR calc Af Amer: >60 mL/min (ref >60)
GFR calc non Af Amer: >60 mL/min (ref >60)
Glucose, Bld: 274 mg/dL — ABNORMAL HIGH (ref 70–99)
Potassium: 3.4 mmol/L — ABNORMAL LOW (ref 3.5–5.1)
Sodium: 141 mmol/L (ref 135–145)

## 2019-07-26 LAB — URINE DRUG SCREEN, QUALITATIVE (ARMC ONLY)
Amphetamines, Ur Screen: NOT DETECTED
Barbiturates, Ur Screen: NOT DETECTED
Benzodiazepine, Ur Scrn: NOT DETECTED
Cannabinoid 50 Ng, Ur ~~LOC~~: NOT DETECTED
Cocaine Metabolite,Ur ~~LOC~~: POSITIVE — AB
MDMA (Ecstasy)Ur Screen: NOT DETECTED
Methadone Scn, Ur: NOT DETECTED
Opiate, Ur Screen: NOT DETECTED
Phencyclidine (PCP) Ur S: NOT DETECTED
Tricyclic, Ur Screen: NOT DETECTED

## 2019-07-26 LAB — CBC
HCT: 23.9 % — ABNORMAL LOW (ref 36.0–46.0)
HCT: 26.4 % — ABNORMAL LOW (ref 36.0–46.0)
Hemoglobin: 8.2 g/dL — ABNORMAL LOW (ref 12.0–15.0)
Hemoglobin: 9 g/dL — ABNORMAL LOW (ref 12.0–15.0)
MCH: 29.7 pg (ref 26.0–34.0)
MCH: 29.7 pg (ref 26.0–34.0)
MCHC: 34.3 g/dL (ref 30.0–36.0)
MCHC: 34.1 g/dL (ref 30.0–36.0)
MCV: 86.6 fL (ref 80.0–100.0)
MCV: 87.1 fL (ref 80.0–100.0)
Platelets: 274 10*3/uL (ref 150–400)
Platelets: 314 10*3/uL (ref 150–400)
RBC: 2.76 MIL/uL — ABNORMAL LOW (ref 3.87–5.11)
RBC: 3.03 MIL/uL — ABNORMAL LOW (ref 3.87–5.11)
RDW: 13.8 % (ref 11.5–15.5)
RDW: 14 % (ref 11.5–15.5)
WBC: 7.8 10*3/uL (ref 4.0–10.5)
WBC: 7.9 10*3/uL (ref 4.0–10.5)
nRBC: 0 % (ref 0.0–0.2)
nRBC: 0 % (ref 0.0–0.2)

## 2019-07-26 LAB — SARS CORONAVIRUS 2 (TAT 6-24 HRS): SARS Coronavirus 2: NEGATIVE

## 2019-07-26 LAB — LIPID PANEL
Cholesterol: 201 mg/dL — ABNORMAL HIGH (ref 0–200)
HDL: 40 mg/dL — ABNORMAL LOW (ref >40)
LDL Cholesterol: 132 mg/dL — ABNORMAL HIGH (ref 0–99)
Total CHOL/HDL Ratio: 5 RATIO
Triglycerides: 145 mg/dL (ref <150)
VLDL: 29 mg/dL (ref 0–40)

## 2019-07-26 LAB — ECHOCARDIOGRAM COMPLETE
Height: 67 in
Weight: 2080 oz

## 2019-07-26 LAB — HEMOGLOBIN A1C
Hgb A1c MFr Bld: 6.4 % — ABNORMAL HIGH (ref 4.8–5.6)
Mean Plasma Glucose: 137 mg/dL

## 2019-07-26 LAB — GLUCOSE, CAPILLARY
Glucose-Capillary: 179 mg/dL — ABNORMAL HIGH (ref 70–99)
Glucose-Capillary: 163 mg/dL — ABNORMAL HIGH (ref 70–99)

## 2019-07-26 LAB — HIV ANTIBODY (ROUTINE TESTING W REFLEX): HIV Screen 4th Generation wRfx: NONREACTIVE

## 2019-07-26 MED ORDER — LORATADINE 10 MG PO TABS
10.0000 mg | ORAL_TABLET | Freq: Every day | ORAL | Status: DC
  Administered 2019-07-26: 10 mg via ORAL
  Filled 2019-07-26: qty 1

## 2019-07-26 MED ORDER — GABAPENTIN 300 MG PO CAPS
300.0000 mg | ORAL_CAPSULE | Freq: Two times a day (BID) | ORAL | Status: DC
  Administered 2019-07-26: 300 mg via ORAL
  Filled 2019-07-26: qty 1

## 2019-07-26 MED ORDER — WOMENS MULTIVITAMIN PO TABS: 1.0000 | ORAL_TABLET | Freq: Every day | ORAL | Status: DC

## 2019-07-26 MED ORDER — ROSUVASTATIN CALCIUM 5 MG PO TABS: 5.0000 mg | ORAL_TABLET | Freq: Every day | ORAL | Status: DC

## 2019-07-26 MED ORDER — PANTOPRAZOLE SODIUM 40 MG PO TBEC: 40.0000 mg | DELAYED_RELEASE_TABLET | Freq: Two times a day (BID) | ORAL | 1 refills | Status: DC

## 2019-07-26 MED ORDER — ROSUVASTATIN CALCIUM 5 MG PO TABS: 5.0000 mg | ORAL_TABLET | Freq: Every day | ORAL | 1 refills | Status: DC

## 2019-07-26 MED ORDER — MAGNESIUM SULFATE 2 GM/50ML IV SOLN: 2.0000 g | Freq: Once | INTRAVENOUS | Status: DC

## 2019-07-26 MED ORDER — LISINOPRIL 10 MG PO TABS: 10.0000 mg | ORAL_TABLET | Freq: Every day | ORAL | 1 refills | Status: DC

## 2019-07-26 MED ORDER — DOCUSATE SODIUM 100 MG PO CAPS
100.0000 mg | ORAL_CAPSULE | Freq: Two times a day (BID) | ORAL | Status: DC
  Administered 2019-07-26: 100 mg via ORAL
  Filled 2019-07-26: qty 1

## 2019-07-26 MED ORDER — FERROUS SULFATE 325 (65 FE) MG PO TABS
325.0000 mg | ORAL_TABLET | Freq: Every day | ORAL | Status: DC
  Filled 2019-07-26 (×2): qty 1

## 2019-07-26 MED ORDER — DULOXETINE HCL 30 MG PO CPEP
30.0000 mg | ORAL_CAPSULE | Freq: Every day | ORAL | Status: DC
  Filled 2019-07-26 (×2): qty 1

## 2019-07-26 MED ORDER — ADULT MULTIVITAMIN W/MINERALS CH
1.0000 | ORAL_TABLET | Freq: Every day | ORAL | Status: DC
  Administered 2019-07-26: 1 via ORAL

## 2019-07-26 MED ORDER — POTASSIUM CHLORIDE CRYS ER 20 MEQ PO TBCR
40.0000 meq | EXTENDED_RELEASE_TABLET | Freq: Once | ORAL | Status: AC
  Administered 2019-07-26: 40 meq via ORAL
  Filled 2019-07-26: qty 2

## 2019-07-26 MED ORDER — MELATONIN 1 MG PO TABS: 1.0000 mg | ORAL_TABLET | Freq: Every day | ORAL | Status: DC

## 2019-07-26 MED ORDER — LISINOPRIL 5 MG PO TABS
5.0000 mg | ORAL_TABLET | Freq: Every day | ORAL | Status: DC
  Administered 2019-07-26: 5 mg via ORAL
  Filled 2019-07-26: qty 1

## 2019-07-26 MED ORDER — METOPROLOL TARTRATE 50 MG PO TABS
50.0000 mg | ORAL_TABLET | Freq: Two times a day (BID) | ORAL | Status: DC
  Administered 2019-07-26: 50 mg via ORAL
  Filled 2019-07-26: qty 1

## 2019-07-26 NOTE — ED Notes (Signed)
ECHO at bedside.

## 2019-07-26 NOTE — Progress Notes (Signed)
Patient prescreened for appt. Pt reports that she was just discharged from hospital. She had a syncopal episode and per pt "everything is ok, it was just stress." Pt reports feeling well now.

## 2019-07-26 NOTE — Progress Notes (Signed)
*  PRELIMINARY RESULTS* Echocardiogram 2D Echocardiogram has been performed.  Sylvia [name] 07/26/2019, 8:46 AM

## 2019-07-26 NOTE — ED Notes (Signed)
Pt assisted to toilet with help from this RN. Pt able to pivot from the toilet and back to the bed without difficulty. Cardiology at bedside at this time

## 2019-07-26 NOTE — ED Notes (Signed)
Medications given that have been verified by pharmacy

## 2019-07-26 NOTE — Progress Notes (Signed)
Progress Note  Patient Name: Sylvia [name] Date of Encounter: 07/26/2019  Primary Cardiologist: new to [facility] - consult by Agbor-Etang  Subjective   Seen in the ED. No chest pain, dyspnea, or palpitations. She really wants to go home. HS-Tn peaked at 270 and is down trending. Echo with preserved LVSF. Orthostatic vital signs normal. MRI brain normal. No arrhythmias on telemetry. Cocaine positive.   Inpatient Medications    Scheduled Meds: . dextromethorphan-guaiFENesin  1 tablet Oral BID  . docusate sodium  100 mg Oral BID  . DULoxetine  30 mg Oral Daily  . ferrous sulfate  325 mg Oral Daily  . gabapentin  300-600 mg Oral BID  . insulin aspart  0-5 Units Subcutaneous QHS  . insulin aspart  0-9 Units Subcutaneous TID WC  . lisinopril  5 mg Oral Daily  . loratadine  10 mg Oral Daily  . melatonin  1 mg Oral QHS  . metoprolol tartrate  50 mg Oral BID  . multivitamin with minerals  1 tablet Oral Daily  . pantoprazole (PROTONIX) IV  40 mg Intravenous Q12H  . rosuvastatin  5 mg Oral q1800   Continuous Infusions: . sodium chloride 75 mL/hr at 07/26/19 0016   PRN Meds: acetaminophen, albuterol, ondansetron (ZOFRAN) IV   Vital Signs    Vitals:   07/26/19 0600 07/26/19 0700 07/26/19 0800 07/26/19 1030  BP: 137/65 (!) 156/77 (!) 141/92   Pulse:    67  Resp: 17 16 18 15   Temp:      TempSrc:      SpO2:    100%  Weight:      Height:       No intake or output data in the 24 hours ending 07/26/19 1043 Filed Weights   07/25/19 0944  Weight: 59 kg    Telemetry    SR - Personally Reviewed  ECG    No new tracings for review - Personally Reviewed  Physical Exam   GEN: No acute distress.   Neck: No JVD. Cardiac: RRR, no murmurs, rubs, or gallops.  Respiratory: Clear to auscultation bilaterally.  GI: Soft, nontender, non-distended.   MS: No edema; No deformity. Neuro:  Alert and oriented x 3; Nonfocal.  Psych: Normal affect.  Labs    Chemistry Recent Labs   Lab 07/25/19 0948 07/26/19 0412  NA 138 141  K 4.1 3.4*  CL 104 112*  CO2 25 22  GLUCOSE 219* 274*  BUN 15 12  CREATININE 0.90 0.66  CALCIUM 9.3 9.0  GFRNONAA >60 >60  GFRAA >60 >60  ANIONGAP 9 7     Hematology Recent Labs  Lab 07/25/19 2123 07/26/19 0412 07/26/19 0838  WBC 8.8 7.8 7.9  RBC 3.01* 2.76* 3.03*  HGB 9.0* 8.2* 9.0*  HCT 26.5* 23.9* 26.4*  MCV 88.0 86.6 87.1  MCH 29.9 29.7 29.7  MCHC 34.0 34.3 34.1  RDW 13.9 13.8 14.0  PLT 317 274 314    Cardiac EnzymesNo results for input(s): TROPONINI in the last 168 hours. No results for input(s): TROPIPOC in the last 168 hours.   BNPNo results for input(s): BNP, PROBNP in the last 168 hours.   DDimer No results for input(s): DDIMER in the last 168 hours.   Radiology    MR BRAIN WO CONTRAST  Result Date: 07/26/2019 MPRESSION: Negative brain MRI. Electronically Signed   By: 09/25/2019 M.D.   On: 07/26/2019 05:29   DG Chest Port 1 View  Result Date: 07/25/2019 IMPRESSION: Findings  suggestive of chronic bronchitis. Electronically Signed   By: Zerita Boers M.D.   On: 07/25/2019 10:10    Cardiac Studies   2D echo 07/26/2019: 1. Left ventricular ejection fraction, by estimation, is 60 to 65%. The  left ventricle has normal function. The left ventricle has no regional  wall motion abnormalities. Left ventricular diastolic parameters are  consistent with Grade I diastolic  dysfunction (impaired relaxation).  2. Right ventricular systolic function is normal. The right ventricular  size is normal. Tricuspid regurgitation signal is inadequate for assessing  PA pressure.   Patient Profile     64 y.o. female with history of PAD s/p iliac stenting in 08/2018 stenting with right BKA, cocaine use, COPD secondary to tobacco use, anemia with GI bleed, HTN, and HLD who we are seeing for syncope with elevated troponin.   Assessment & Plan    1. Syncope: -Of uncertain etiology -Ideally, would monitor on telemetry,  perform ischemic work up given her elevated troponin and perform carotid artery ultrasound -However, the patient does not want admission and requests to go home with outpatient follow up -She has been advised not to drive for 6 months at this time -She will need carotid artery ultrasound and Zio patch as an outpatient  -Planning for [facility] tomorrow as an outpatient   2. Elevated HS-Tn: -Never with chest pain -Minimally elevated and not consistent with ACS -Cannot exclude vasospasm with cocaine -Echo with preserved LVSF -Patient does not want admission and is requesting to go home -Plan for outpatient Lexiscan MPI, has been scheduled for 07/27/2019  3. Cocaine use: -Possibly contributing to the above -Cessation is advised   4. PAD: -Status post stenting and right BKA -Defer management of Plavix/Elqiuis to primary/vascular   5. Anemia with prior GI bleed: -HGB 8.2 which appears to be similar to her baseline  6. HTN: -Stop metoprolol with ongoing cocaine use and no cardiac indication for this medication at this time -Management per primary service    For questions or updates, please contact [place] Please consult [email or web address] for contact info under Cardiology/STEMI.    Signed, Christell Faith, PA-C [place] Pager: [phone] 07/26/2019, 10:43 AM

## 2019-07-26 NOTE — Telephone Encounter (Signed)
Order placed. Called patient and she verbalized understanding of all the below preprocedural instructions. Message sent to [facility].   [facility]IEW  Your caregiver has ordered a Stress Test with nuclear imaging. The purpose of this test is to evaluate the blood supply to your heart muscle. This procedure is referred to as a "Non-Invasive Stress Test." This is because other than having an IV started in your vein, nothing is inserted or "invades" your body. Cardiac stress tests are done to find areas of poor blood flow to the heart by determining the extent of coronary artery disease (CAD). Some patients exercise on a treadmill, which naturally increases the blood flow to your heart, while others who are  unable to walk on a treadmill due to physical limitations have a pharmacologic/chemical stress agent called Lexiscan . This medicine will mimic walking on a treadmill by temporarily increasing your coronary blood flow.   Please note: these test may take anywhere between 2-4 hours to complete  PLEASE REPORT TO [facility] MEDICAL MALL ENTRANCE  THE VOLUNTEERS AT THE FIRST DESK WILL DIRECT YOU WHERE TO GO  Date of Procedure:_______04/09/2021______________  Arrival Time for Procedure:__________09:45 am______________  Instructions regarding medication:   _xx_ : Hold diabetes medication morning of procedure - Januvia  PLEASE NOTIFY THE OFFICE AT LEAST 24 HOURS IN ADVANCE IF YOU ARE UNABLE TO KEEP YOUR APPOINTMENT.  [phone] AND  PLEASE NOTIFY NUCLEAR MEDICINE AT [facility] AT LEAST 24 HOURS IN ADVANCE IF YOU ARE UNABLE TO KEEP YOUR APPOINTMENT. [phone]  How to prepare for your Myoview test:  1. Do not eat or drink after midnight 2. No caffeine for 24 hours prior to test 3. No smoking 24 hours prior to test. 4. Your medication may be taken with water.  If your doctor stopped a medication because of this test, do not take that medication. 5. Ladies, please do not wear dresses.  Skirts or pants are  appropriate. Please wear a short sleeve shirt. 6. No perfume, cologne or lotion. 7. Wear comfortable walking shoes. No heels!

## 2019-07-26 NOTE — Discharge Summary (Signed)
Physician Discharge Summary  Sylvia [name] BZJ:[id number] DOB: Feb 21, 1956 DOA: 07/25/2019  PCP: [facility]  Admit date: 07/25/2019 Discharge date: 07/26/2019  Admitted From: Home Disposition: Home  Recommendations for Outpatient Follow-up:  1. Follow up with PCP in 1-2 weeks 2. Follow-up with cardiology. 3. Please obtain BMP/CBC in one week 4. Please follow up on the following pending results: None  Home Health: No Equipment/Devices: None Discharge Condition: Stable CODE STATUS: Full Diet recommendation: Heart Healthy / Carb Modified    Brief/Interim Summary:  Sylvia [name] is a 64 y.o. female with medical history significant of hypertension, hyperlipidemia, diabetes mellitus, COPD, GERD, depression, anemia, GI bleeding, PAD, duodenal ulcer, s/p of right BKA, who presents with syncope.  Patient states that he had an episode of syncope event this morning. After that, she developed dizziness.  No unilateral numbness or tingling in extremities.  No facial droop or slurred speech.  Patient does not have chest pain, shortness of breath, cough.  No fever or chills. Pt states that she has been having ongoing rectal bleeding with no specific diagnosis. She had blood transfusion and last time she received blood was about a month ago.  Patient does not have nausea vomiting or abdominal pain.  She was observed due to syncopal episode.  Her orthostatic vitals were normal.  MRI brain was normal.  No neurologic deficit.  She did had elevated troponin which peaked at 270.  Cardiology was consulted and it was thought to be due to demand ischemia.  No chest pain and EKG was without any acute changes.  UDS was positive for cocaine.  Per patient her last use was on Tuesday.  She said that she recently started using cocaine due to some lifetime difficulties.  We discussed extensively regarding stop using cocaine and its effect on blood vessels which can cause heart attack or  stroke.  She was on metoprolol at home which was discontinued and her cardiologist can restart it if she stop using cocaine.  We increased her dose of lisinopril for better control of her blood pressure.  She will follow-up with cardiology as an outpatient for further cardiac work-up.  Per patient she has an history of chronic GI bleed.  Hemoglobin remained stable.  She did not had any bowel movement during her stay in hospital.  Prior GI work-up was unable to find any source.  Patient is on low-dose Eliquis, aspirin and Plavix by vascular surgery.  Aspirin was discontinued and she will continue with Eliquis and Plavix and will follow up with vascular surgery for further recommendations.  She will follow up with her hematologist for further management of her anemia.  She will continue with rest of her home meds..  Discharge Diagnoses:  Principal Problem:   Syncope Active Problems:   HTN (hypertension)   Anemia associated with acute blood loss   Hx of right BKA (HCC)   Diabetes mellitus without complication (HCC)   GERD (gastroesophageal reflux disease)   COPD (chronic obstructive pulmonary disease) (HCC)   GIB (gastrointestinal bleeding)   HLD (hyperlipidemia)   Depression   Elevated troponin  Discharge Instructions  Discharge Instructions    Diet - low sodium heart healthy   Complete by: As directed    Discharge instructions   Complete by: As directed    It was pleasure taking care of you. As we discussed please stop using cocaine as it can adversely affect you. We are discontinuing metoprolol, your cardiologist can restart it if needed and you  stop using cocaine as that cannot be taken with cocaine. I am increasing the dose of lisinopril for better control of your blood pressure, please follow-up with your primary care physician for further management. Please follow-up with cardiologist as an outpatient.   Increase activity slowly   Complete by: As directed      Allergies as  of 07/26/2019      Reactions   Metformin And Related Diarrhea   Penicillins Hives   Has patient had a PCN reaction causing immediate rash, facial/tongue/throat swelling, SOB or lightheadedness with hypotension: Yes Has patient had a PCN reaction causing severe rash involving mucus membranes or skin necrosis: No Has patient had a PCN reaction that required hospitalization: No Has patient had a PCN reaction occurring within the last 10 years: No If all of the above answers are "NO", then may proceed with Cephalosporin use.   Tramadol Itching      Medication List    STOP taking these medications   aspirin 81 MG EC tablet   metoprolol tartrate 50 MG tablet Commonly known as: LOPRESSOR     TAKE these medications   acetaminophen 325 MG tablet Commonly known as: TYLENOL Take 650 mg by mouth every 6 (six) hours as needed.   albuterol 108 (90 Base) MCG/ACT inhaler Commonly known as: VENTOLIN HFA Inhale 2 puffs into the lungs every 6 (six) hours as needed for wheezing or shortness of breath.   apixaban 2.5 MG Tabs tablet Commonly known as: ELIQUIS Take 1 tablet (2.5 mg total) by mouth 2 (two) times daily.   cetirizine 10 MG tablet Commonly known as: ZYRTEC Take 10 mg by mouth daily.   clopidogrel 75 MG tablet Commonly known as: PLAVIX Take 75 mg by mouth daily.   docusate sodium 100 MG capsule Commonly known as: COLACE Take 100 mg by mouth 2 (two) times daily.   DULoxetine 30 MG capsule Commonly known as: CYMBALTA Take 30 mg by mouth daily.   ferrous sulfate 325 (65 FE) MG tablet Take 325 mg by mouth daily.   gabapentin 300 MG capsule Commonly known as: NEURONTIN Take 2 Capsules (600 mg) at Bedtime and 1 capsule (300 mg) in the afternoon   lisinopril 10 MG tablet Commonly known as: ZESTRIL Take 1 tablet (10 mg total) by mouth daily. What changed:   medication strength  how much to take   melatonin 1 MG Tabs tablet Take 1 tablet by mouth at bedtime.    pantoprazole 40 MG tablet Commonly known as: PROTONIX Take 1 tablet (40 mg total) by mouth 2 (two) times daily.   rosuvastatin 5 MG tablet Commonly known as: CRESTOR Take 1 tablet (5 mg total) by mouth daily at 6 PM.   sitaGLIPtin 100 MG tablet Commonly known as: JANUVIA Take 100 mg by mouth daily.   WOMENS MULTIVITAMIN PO Take 1 tablet by mouth daily.       Allergies  Allergen Reactions  . Metformin And Related Diarrhea  . Penicillins Hives    Has patient had a PCN reaction causing immediate rash, facial/tongue/throat swelling, SOB or lightheadedness with hypotension: Yes Has patient had a PCN reaction causing severe rash involving mucus membranes or skin necrosis: No Has patient had a PCN reaction that required hospitalization: No Has patient had a PCN reaction occurring within the last 10 years: No If all of the above answers are "NO", then may proceed with Cephalosporin use.  . Tramadol Itching    Consultations:  Cardiology  Procedures/Studies: MR BRAIN WO  CONTRAST  Result Date: 07/26/2019 CLINICAL DATA:  Syncope/presyncope with cardiac cause suspected. EXAM: MRI HEAD WITHOUT CONTRAST TECHNIQUE: Multiplanar, multiecho pulse sequences of the brain and surrounding structures were obtained without intravenous contrast. COMPARISON:  None. FINDINGS: Brain: No acute infarction, hemorrhage, hydrocephalus, extra-axial collection or mass lesion. Vascular: Normal flow voids, including vertebrobasilar. Skull and upper cervical spine: Normal marrow signal Sinuses/Orbits: Negative IMPRESSION: Negative brain MRI. Electronically Signed   By: Marnee Spring M.D.   On: 07/26/2019 05:29   DG Chest Port 1 View  Result Date: 07/25/2019 CLINICAL DATA:  Syncope EXAM: PORTABLE CHEST 1 VIEW COMPARISON:  Chest radiograph dated 08/20/2017 FINDINGS: The heart size and mediastinal contours are within normal limits. Chronic peribronchial cuffing is not significantly changed. No focal consolidation,  pleural effusion or pneumothorax. The visualized skeletal structures are unremarkable. IMPRESSION: Findings suggestive of chronic bronchitis. Electronically Signed   By: Romona Curls M.D.   On: 07/25/2019 10:10   ECHOCARDIOGRAM COMPLETE  Result Date: 07/26/2019    ECHOCARDIOGRAM REPORT   Patient Name:   Sylvia [name] Date of Exam: 07/26/2019 Medical Rec #:  [id number]          Height:       67.0 in Accession #:    [id number]         Weight:       130.0 lb Date of Birth:  07-Nov-1955           BSA:          1.684 m Patient Age:    63 years           BP:           133/68 mmHg Patient Gender: F                  HR:           76 bpm. Exam Location:  [facility] Procedure: 2D Echo, Color Doppler and Cardiac Doppler Indications:     R55 Syncope  History:         Patient has no prior history of Echocardiogram examinations.                  COPD; Risk Factors:Hypertension and Diabetes.  Sonographer:     Humphrey Rolls RDCS (AE) Referring Phys:  [id number] Brien Few NIU Diagnosing Phys: Julien Nordmann MD IMPRESSIONS  1. Left ventricular ejection fraction, by estimation, is 60 to 65%. The left ventricle has normal function. The left ventricle has no regional wall motion abnormalities. Left ventricular diastolic parameters are consistent with Grade I diastolic dysfunction (impaired relaxation).  2. Right ventricular systolic function is normal. The right ventricular size is normal. Tricuspid regurgitation signal is inadequate for assessing PA pressure. FINDINGS  Left Ventricle: Left ventricular ejection fraction, by estimation, is 60 to 65%. The left ventricle has normal function. The left ventricle has no regional wall motion abnormalities. The left ventricular internal cavity size was normal in size. There is  borderline left ventricular hypertrophy. Left ventricular diastolic parameters are consistent with Grade I diastolic dysfunction (impaired relaxation). Right Ventricle: The right ventricular size is normal. No increase in right  ventricular wall thickness. Right ventricular systolic function is normal. Tricuspid regurgitation signal is inadequate for assessing PA pressure. Left Atrium: Left atrial size was normal in size. Right Atrium: Right atrial size was normal in size. Pericardium: There is no evidence of pericardial effusion. Mitral Valve: The mitral valve is normal in structure. Normal mobility of the mitral valve leaflets. Mild mitral  valve regurgitation. No evidence of mitral valve stenosis. MV peak gradient, 2.7 mmHg. The mean mitral valve gradient is 1.0 mmHg. Tricuspid Valve: The tricuspid valve is normal in structure. Tricuspid valve regurgitation is mild . No evidence of tricuspid stenosis. Aortic Valve: The aortic valve is normal in structure. Aortic valve regurgitation is not visualized. No aortic stenosis is present. Aortic valve mean gradient measures 3.0 mmHg. Aortic valve peak gradient measures 5.8 mmHg. Aortic valve area, by VTI measures 2.77 cm. Pulmonic Valve: The pulmonic valve was normal in structure. Pulmonic valve regurgitation is not visualized. No evidence of pulmonic stenosis. Aorta: The aortic root is normal in size and structure. Venous: The inferior vena cava is normal in size with greater than 50% respiratory variability, suggesting right atrial pressure of 3 mmHg. IAS/Shunts: No atrial level shunt detected by color flow Doppler.  LEFT VENTRICLE PLAX 2D LVIDd:         4.55 cm  Diastology LVIDs:         2.88 cm  LV e' lateral:   8.38 cm/s LV PW:         0.91 cm  LV E/e' lateral: 8.4 LV IVS:        0.62 cm  LV e' medial:    6.96 cm/s LVOT diam:     2.20 cm  LV E/e' medial:  10.2 LV SV:         68 LV SV Index:   40 LVOT Area:     3.80 cm  LEFT ATRIUM           Index LA diam:      2.00 cm 1.19 cm/m LA Vol (A2C): 31.3 ml 18.59 ml/m LA Vol (A4C): 50.2 ml 29.81 ml/m  AORTIC VALVE                   PULMONIC VALVE AV Area (Vmax):    2.55 cm    PV Vmax:       0.94 m/s AV Area (Vmean):   2.68 cm    PV Vmean:       66.400 cm/s AV Area (VTI):     2.77 cm    PV VTI:        0.165 m AV Vmax:           120.00 cm/s PV Peak grad:  3.5 mmHg AV Vmean:          85.800 cm/s PV Mean grad:  2.0 mmHg AV VTI:            0.244 m AV Peak Grad:      5.8 mmHg AV Mean Grad:      3.0 mmHg LVOT Vmax:         80.50 cm/s LVOT Vmean:        60.400 cm/s LVOT VTI:          0.178 m LVOT/AV VTI ratio: 0.73  AORTA Ao Root diam: 3.30 cm MITRAL VALVE MV Area (PHT): 4.80 cm    SHUNTS MV Peak grad:  2.7 mmHg    Systemic VTI:  0.18 m MV Mean grad:  1.0 mmHg    Systemic Diam: 2.20 cm MV Vmax:       0.82 m/s MV Vmean:      57.2 cm/s MV Decel Time: 158 msec MV E velocity: 70.80 cm/s MV A velocity: 68.40 cm/s MV E/A ratio:  1.04 Julien Nordmann MD Electronically signed by Julien Nordmann MD Signature Date/Time: 07/26/2019/10:50:46 AM    Final  Subjective: Patient was feeling better when seen today.  She was eager to go home.  No new complaints.  We had a long discussion regarding her cocaine use, promised to consider it.  Discharge Exam: Vitals:   07/26/19 0800 07/26/19 1030  BP: (!) 141/92   Pulse:  67  Resp: 18 15  Temp:    SpO2:  100%   Vitals:   07/26/19 0600 07/26/19 0700 07/26/19 0800 07/26/19 1030  BP: 137/65 (!) 156/77 (!) 141/92   Pulse:    67  Resp: 17 16 18 15   Temp:      TempSrc:      SpO2:    100%  Weight:      Height:        General: Pt is alert, awake, not in acute distress Cardiovascular: RRR, S1/S2 +, no rubs, no gallops Respiratory: CTA bilaterally, no wheezing, no rhonchi Abdominal: Soft, NT, ND, bowel sounds + Extremities: no edema, no cyanosis   The results of significant diagnostics from this hospitalization (including imaging, microbiology, ancillary and laboratory) are listed below for reference.    Microbiology: Recent Results (from the past 240 hour(s))  SARS CORONAVIRUS 2 (TAT 6-24 HRS) Nasopharyngeal Nasopharyngeal Swab     Status: None   Collection Time: 07/25/19  1:41 PM   Specimen:  Nasopharyngeal Swab  Result Value Ref Range Status   SARS Coronavirus 2 NEGATIVE NEGATIVE Final    Comment: (NOTE) SARS-CoV-2 target nucleic acids are NOT DETECTED. The SARS-CoV-2 RNA is generally detectable in upper and lower respiratory specimens during the acute phase of infection. Negative results do not preclude SARS-CoV-2 infection, do not rule out co-infections with other pathogens, and should not be used as the sole basis for treatment or other patient management decisions. Negative results must be combined with clinical observations, patient history, and epidemiological information. The expected result is Negative. Fact Sheet for Patients: SugarRoll.be Fact Sheet for Healthcare Providers: https://www.woods-mathews.com/ This test is not yet approved or cleared by the [place] FDA and  has been authorized for detection and/or diagnosis of SARS-CoV-2 by FDA under an Emergency Use Authorization (EUA). This EUA will remain  in effect (meaning this test can be used) for the duration of the COVID-19 declaration under Section 56 4(b)(1) of the Act, 21 U.S.C. section 360bbb-3(b)(1), unless the authorization is terminated or revoked sooner. Performed at [facility], [place] [address] [place], [place] [address]      Labs: BNP (last 3 results) Recent Labs    12/14/18 2050  BNP 62.3   Basic Metabolic Panel: Recent Labs  Lab 07/25/19 0948 07/26/19 0412  NA 138 141  K 4.1 3.4*  CL 104 112*  CO2 25 22  GLUCOSE 219* 274*  BUN 15 12  CREATININE 0.90 0.66  CALCIUM 9.3 9.0  MG  --  1.6*   Liver Function Tests: No results for input(s): AST, ALT, ALKPHOS, BILITOT, PROT, ALBUMIN in the last 168 hours. No results for input(s): LIPASE, AMYLASE in the last 168 hours. No results for input(s): AMMONIA in the last 168 hours. CBC: Recent Labs  Lab 07/25/19 1241 07/25/19 1341 07/25/19 2123 07/26/19 0412 07/26/19 0838  WBC  10.8* 10.0 8.8 7.8 7.9  HGB 9.8* 9.6* 9.0* 8.2* 9.0*  HCT 27.9* 27.7* 26.5* 23.9* 26.4*  MCV 87.2 86.6 88.0 86.6 87.1  PLT 310 304 317 274 314   Cardiac Enzymes: No results for input(s): CKTOTAL, CKMB, CKMBINDEX, TROPONINI in the last 168 hours. BNP: Invalid input(s): POCBNP  CBG: Recent Labs  Lab 07/25/19 2113 07/26/19 0847 07/26/19 1237  GLUCAP 217* 179* 163*   D-Dimer No results for input(s): DDIMER in the last 72 hours. Hgb A1c Recent Labs    07/25/19 1341  HGBA1C 6.4*   Lipid Profile Recent Labs    07/26/19 0412  CHOL 201*  HDL 40*  LDLCALC 132*  TRIG 145  CHOLHDL 5.0   Thyroid function studies No results for input(s): TSH, T4TOTAL, T3FREE, THYROIDAB in the last 72 hours.  Invalid input(s): FREET3 Anemia work up No results for input(s): VITAMINB12, FOLATE, FERRITIN, TIBC, IRON, RETICCTPCT in the last 72 hours. Urinalysis    Component Value Date/Time   COLORURINE YELLOW (A) 05/16/2019 1203   APPEARANCEUR HAZY (A) 05/16/2019 1203   LABSPEC 1.013 05/16/2019 1203   PHURINE 5.0 05/16/2019 1203   GLUCOSEU >=500 (A) 05/16/2019 1203   HGBUR NEGATIVE 05/16/2019 1203   BILIRUBINUR NEGATIVE 05/16/2019 1203   KETONESUR NEGATIVE 05/16/2019 1203   PROTEINUR NEGATIVE 05/16/2019 1203   NITRITE NEGATIVE 05/16/2019 1203   LEUKOCYTESUR NEGATIVE 05/16/2019 1203   Sepsis Labs Invalid input(s): PROCALCITONIN,  WBC,  LACTICIDVEN Microbiology Recent Results (from the past 240 hour(s))  SARS CORONAVIRUS 2 (TAT 6-24 HRS) Nasopharyngeal Nasopharyngeal Swab     Status: None   Collection Time: 07/25/19  1:41 PM   Specimen: Nasopharyngeal Swab  Result Value Ref Range Status   SARS Coronavirus 2 NEGATIVE NEGATIVE Final    Comment: (NOTE) SARS-CoV-2 target nucleic acids are NOT DETECTED. The SARS-CoV-2 RNA is generally detectable in upper and lower respiratory specimens during the acute phase of infection. Negative results do not preclude SARS-CoV-2 infection, do not rule  out co-infections with other pathogens, and should not be used as the sole basis for treatment or other patient management decisions. Negative results must be combined with clinical observations, patient history, and epidemiological information. The expected result is Negative. Fact Sheet for Patients: HairSlick.no Fact Sheet for Healthcare Providers: quierodirigir.com This test is not yet approved or cleared by the [place] FDA and  has been authorized for detection and/or diagnosis of SARS-CoV-2 by FDA under an Emergency Use Authorization (EUA). This EUA will remain  in effect (meaning this test can be used) for the duration of the COVID-19 declaration under Section 56 4(b)(1) of the Act, 21 U.S.C. section 360bbb-3(b)(1), unless the authorization is terminated or revoked sooner. Performed at [facility], [address] [place], [place] [address]     Time coordinating discharge: Over 30 minutes  SIGNED:  Arnetha Courser, MD  [facility] 07/26/2019, 1:50 PM  If 7PM-7AM, please contact night-coverage [email or web address]  This record has been created using Conservation officer, historic buildings. Errors have been sought and corrected,but may not always be located. Such creation errors do not reflect on the standard of care.

## 2019-07-27 ENCOUNTER — Inpatient Hospital Stay: Payer: Medicaid Other | Admitting: Oncology

## 2019-07-27 ENCOUNTER — Inpatient Hospital Stay: Payer: Medicaid Other

## 2019-07-27 ENCOUNTER — Telehealth: Payer: Self-pay | Admitting: Oncology

## 2019-07-27 ENCOUNTER — Observation Stay: Payer: Medicaid Other

## 2019-07-27 NOTE — Telephone Encounter (Signed)
Patient phoned and rescheduled patient's appts for 07-27-19 as she was not feeling well. Appts rescheduled for 07-30-19.

## 2019-07-30 ENCOUNTER — Inpatient Hospital Stay: Payer: Medicaid Other | Attending: Oncology

## 2019-07-30 ENCOUNTER — Inpatient Hospital Stay: Payer: Medicaid Other | Admitting: Oncology

## 2019-07-31 ENCOUNTER — Ambulatory Visit (INDEPENDENT_AMBULATORY_CARE_PROVIDER_SITE_OTHER): Payer: Medicaid Other | Admitting: Gastroenterology

## 2019-07-31 ENCOUNTER — Encounter: Payer: Self-pay | Admitting: Gastroenterology

## 2019-07-31 DIAGNOSIS — D649 Anemia, unspecified: Secondary | ICD-10-CM

## 2019-07-31 NOTE — Progress Notes (Addendum)
[name], MD [address]  [address]  [place], [place] [address]  Main: [phone]  Fax: [phone]   Primary Care Physician: [facility]  Virtual Visit via Telephone Note  I connected with patient on 07/31/19 at 10:45 AM EDT by telephone and verified that I am speaking with the correct person using two identifiers.   I discussed the limitations, risks, security and privacy concerns of performing an evaluation and management service by telephone and the availability of in person appointments. I also discussed with the patient that there may be a patient responsible charge related to this service. The patient expressed understanding and agreed to proceed.  Location of Patient: Home Location of Provider: Home Persons involved: Patient and provider only during the visit (nursing staff and front desk staff was involved in communicating with the patient prior to the appointment, reviewing medications and checking them in)   History of Present Illness: Chief Complaint  Patient presents with  . Anemia    Patient is having fatigue      HPI: Sylvia [name] is a 64 y.o. female here for follow-up of anemia.  Patient recently hospitalized with syncope, diagnosed with demand ischemia with outpatient stress test pending at this time.  Denies any melena or hematochezia recently.  No abdominal pain or dysphagia.  No nausea or vomiting.    Patient underwent EGD colonoscopy and small bowel capsule study during her hospitalization in December 2020.  EGD was negative, colonoscopy with small polyps removed, small bowel capsule study did not reach this small bowel and was inconclusive.    She was scheduled for repeat small bowel capsule study but she canceled this  Sees Dr. Cathie Hoops of hematology who initially had her on iron replacement.  This has been discontinued as her anemia is considered to be multifactorial with normocytic anemia being the  diagnosis.  Current Outpatient Medications  Medication Sig Dispense Refill  . acetaminophen (TYLENOL) 325 MG tablet Take 650 mg by mouth every 6 (six) hours as needed.    Marland Kitchen albuterol (PROVENTIL HFA;VENTOLIN HFA) 108 (90 Base) MCG/ACT inhaler Inhale 2 puffs into the lungs every 6 (six) hours as needed for wheezing or shortness of breath. 1 Inhaler 2  . apixaban (ELIQUIS) 2.5 MG TABS tablet Take 1 tablet (2.5 mg total) by mouth 2 (two) times daily. 60 tablet 3  . cetirizine (ZYRTEC) 10 MG tablet Take 10 mg by mouth daily.    . clopidogrel (PLAVIX) 75 MG tablet Take 75 mg by mouth daily.    Marland Kitchen docusate sodium (COLACE) 100 MG capsule Take 100 mg by mouth 2 (two) times daily.    . DULoxetine (CYMBALTA) 30 MG capsule Take 30 mg by mouth daily.    . ferrous sulfate 325 (65 FE) MG tablet Take 325 mg by mouth daily.     Marland Kitchen gabapentin (NEURONTIN) 300 MG capsule Take 2 Capsules (600 mg) at Bedtime and 1 capsule (300 mg) in the afternoon 90 capsule 2  . lisinopril (ZESTRIL) 10 MG tablet Take 1 tablet (10 mg total) by mouth daily. 30 tablet 1  . Melatonin 1 MG TABS Take 1 tablet by mouth at bedtime.    . Multiple Vitamins-Minerals (WOMENS MULTIVITAMIN PO) Take 1 tablet by mouth daily.    . pantoprazole (PROTONIX) 40 MG tablet Take 1 tablet (40 mg total) by mouth 2 (two) times daily. 60 tablet 1  . rosuvastatin (CRESTOR) 5 MG tablet Take 1 tablet (5 mg total) by mouth  daily at 6 PM. 30 tablet 1  . sitaGLIPtin (JANUVIA) 100 MG tablet Take 100 mg by mouth daily.     No current facility-administered medications for this visit.    Allergies as of 07/31/2019 - Review Complete 07/31/2019  Allergen Reaction Noted  . Metformin and related Diarrhea 06/04/2019  . Penicillins Hives 11/14/2015  . Tramadol Itching 12/14/2018    Review of Systems:    All systems reviewed and negative except where noted in HPI.   Observations/Objective:  Labs: CMP     Component Value Date/Time   NA 141 07/26/2019 0412   K  3.4 (L) 07/26/2019 0412   CL 112 (H) 07/26/2019 0412   CO2 22 07/26/2019 0412   GLUCOSE 274 (H) 07/26/2019 0412   BUN 12 07/26/2019 0412   CREATININE 0.66 07/26/2019 0412   CALCIUM 9.0 07/26/2019 0412   PROT 7.6 05/16/2019 1142   ALBUMIN 3.9 05/16/2019 1142   AST 18 05/16/2019 1142   ALT 17 05/16/2019 1142   ALKPHOS 91 05/16/2019 1142   BILITOT 0.7 05/16/2019 1142   GFRNONAA >60 07/26/2019 0412   GFRAA >60 07/26/2019 0412   Lab Results  Component Value Date   WBC 7.9 07/26/2019   HGB 9.0 (L) 07/26/2019   HCT 26.4 (L) 07/26/2019   MCV 87.1 07/26/2019   PLT 314 07/26/2019    Imaging Studies: MR BRAIN WO CONTRAST  Result Date: 07/26/2019 CLINICAL DATA:  Syncope/presyncope with cardiac cause suspected. EXAM: MRI HEAD WITHOUT CONTRAST TECHNIQUE: Multiplanar, multiecho pulse sequences of the brain and surrounding structures were obtained without intravenous contrast. COMPARISON:  None. FINDINGS: Brain: No acute infarction, hemorrhage, hydrocephalus, extra-axial collection or mass lesion. Vascular: Normal flow voids, including vertebrobasilar. Skull and upper cervical spine: Normal marrow signal Sinuses/Orbits: Negative IMPRESSION: Negative brain MRI. Electronically Signed   By: Monte Fantasia M.D.   On: 07/26/2019 05:29   DG Chest Port 1 View  Result Date: 07/25/2019 CLINICAL DATA:  Syncope EXAM: PORTABLE CHEST 1 VIEW COMPARISON:  Chest radiograph dated 08/20/2017 FINDINGS: The heart size and mediastinal contours are within normal limits. Chronic peribronchial cuffing is not significantly changed. No focal consolidation, pleural effusion or pneumothorax. The visualized skeletal structures are unremarkable. IMPRESSION: Findings suggestive of chronic bronchitis. Electronically Signed   By: Zerita Boers M.D.   On: 07/25/2019 10:10   ECHOCARDIOGRAM COMPLETE  Result Date: 07/26/2019    ECHOCARDIOGRAM REPORT   Patient Name:   Sylvia [name] Date of Exam: 07/26/2019 Medical Rec #:  [id number]           Height:       67.0 in Accession #:    [id number]         Weight:       130.0 lb Date of Birth:  Jan 01, 1956           BSA:          1.684 m Patient Age:    16 years           BP:           133/68 mmHg Patient Gender: F                  HR:           76 bpm. Exam Location:  [facility] Procedure: 2D Echo, Color Doppler and Cardiac Doppler Indications:     R55 Syncope  History:         Patient has no prior history of Echocardiogram examinations.  COPD; Risk Factors:Hypertension and Diabetes.  Sonographer:     Humphrey Rolls RDCS (AE) Referring Phys:  [id number] Brien Few NIU Diagnosing Phys: Julien Nordmann MD IMPRESSIONS  1. Left ventricular ejection fraction, by estimation, is 60 to 65%. The left ventricle has normal function. The left ventricle has no regional wall motion abnormalities. Left ventricular diastolic parameters are consistent with Grade I diastolic dysfunction (impaired relaxation).  2. Right ventricular systolic function is normal. The right ventricular size is normal. Tricuspid regurgitation signal is inadequate for assessing PA pressure. FINDINGS  Left Ventricle: Left ventricular ejection fraction, by estimation, is 60 to 65%. The left ventricle has normal function. The left ventricle has no regional wall motion abnormalities. The left ventricular internal cavity size was normal in size. There is  borderline left ventricular hypertrophy. Left ventricular diastolic parameters are consistent with Grade I diastolic dysfunction (impaired relaxation). Right Ventricle: The right ventricular size is normal. No increase in right ventricular wall thickness. Right ventricular systolic function is normal. Tricuspid regurgitation signal is inadequate for assessing PA pressure. Left Atrium: Left atrial size was normal in size. Right Atrium: Right atrial size was normal in size. Pericardium: There is no evidence of pericardial effusion. Mitral Valve: The mitral valve is normal in structure. Normal mobility of the  mitral valve leaflets. Mild mitral valve regurgitation. No evidence of mitral valve stenosis. MV peak gradient, 2.7 mmHg. The mean mitral valve gradient is 1.0 mmHg. Tricuspid Valve: The tricuspid valve is normal in structure. Tricuspid valve regurgitation is mild . No evidence of tricuspid stenosis. Aortic Valve: The aortic valve is normal in structure. Aortic valve regurgitation is not visualized. No aortic stenosis is present. Aortic valve mean gradient measures 3.0 mmHg. Aortic valve peak gradient measures 5.8 mmHg. Aortic valve area, by VTI measures 2.77 cm. Pulmonic Valve: The pulmonic valve was normal in structure. Pulmonic valve regurgitation is not visualized. No evidence of pulmonic stenosis. Aorta: The aortic root is normal in size and structure. Venous: The inferior vena cava is normal in size with greater than 50% respiratory variability, suggesting right atrial pressure of 3 mmHg. IAS/Shunts: No atrial level shunt detected by color flow Doppler.  LEFT VENTRICLE PLAX 2D LVIDd:         4.55 cm  Diastology LVIDs:         2.88 cm  LV e' lateral:   8.38 cm/s LV PW:         0.91 cm  LV E/e' lateral: 8.4 LV IVS:        0.62 cm  LV e' medial:    6.96 cm/s LVOT diam:     2.20 cm  LV E/e' medial:  10.2 LV SV:         68 LV SV Index:   40 LVOT Area:     3.80 cm  LEFT ATRIUM           Index LA diam:      2.00 cm 1.19 cm/m LA Vol (A2C): 31.3 ml 18.59 ml/m LA Vol (A4C): 50.2 ml 29.81 ml/m  AORTIC VALVE                   PULMONIC VALVE AV Area (Vmax):    2.55 cm    PV Vmax:       0.94 m/s AV Area (Vmean):   2.68 cm    PV Vmean:      66.400 cm/s AV Area (VTI):     2.77 cm    PV VTI:  0.165 m AV Vmax:           120.00 cm/s PV Peak grad:  3.5 mmHg AV Vmean:          85.800 cm/s PV Mean grad:  2.0 mmHg AV VTI:            0.244 m AV Peak Grad:      5.8 mmHg AV Mean Grad:      3.0 mmHg LVOT Vmax:         80.50 cm/s LVOT Vmean:        60.400 cm/s LVOT VTI:          0.178 m LVOT/AV VTI ratio: 0.73  AORTA Ao  Root diam: 3.30 cm MITRAL VALVE MV Area (PHT): 4.80 cm    SHUNTS MV Peak grad:  2.7 mmHg    Systemic VTI:  0.18 m MV Mean grad:  1.0 mmHg    Systemic Diam: 2.20 cm MV Vmax:       0.82 m/s MV Vmean:      57.2 cm/s MV Decel Time: 158 msec MV E velocity: 70.80 cm/s MV A velocity: 68.40 cm/s MV E/A ratio:  1.04 Julien Nordmann MD Electronically signed by Julien Nordmann MD Signature Date/Time: 07/26/2019/10:50:46 AM    Final     Assessment and Plan:   Kinze Labo is a 64 y.o. y/o female here for follow-up of anemia  Assessment and Plan: She is not having any signs of active GI bleeding  She is due for repeat colonoscopy due to poor prep on last procedure.  However, she is pending cardiology work-up at this time with a stress test already scheduled for April 21 and was recently admitted with demand ischemia.  Therefore, we will need to wait for cardiology work-up to be completed prior to any endoscopic procedures as risks of procedure would outweigh any benefits at this time in the absence of any active GI bleeding and hemoglobin at baseline  Scheduled for repeat small bowel capsule study on subsequent visit as well as patient canceled the one she was scheduled for in March 2021  Follow Up Instructions: Follow-up in 6 weeks Follow-up post stress test and obtain cardiology work-up for repeat colonoscopy after cardiology work-up is complete   I discussed the assessment and treatment plan with the patient. The patient was provided an opportunity to ask questions and all were answered. The patient agreed with the plan and demonstrated an understanding of the instructions.   The patient was advised to call back or seek an in-person evaluation if the symptoms worsen or if the condition fails to improve as anticipated.  I provided 11 minutes of non-face-to-face time during this encounter. Additional time was spent in reviewing patient's chart, placing orders etc.   Pasty Spillers, MD  Speech  recognition software was used to dictate this note.

## 2019-08-06 ENCOUNTER — Ambulatory Visit: Payer: Medicaid Other | Admitting: Cardiology

## 2019-08-07 ENCOUNTER — Inpatient Hospital Stay: Payer: Medicaid Other

## 2019-08-07 ENCOUNTER — Inpatient Hospital Stay: Payer: Medicaid Other | Admitting: Oncology

## 2019-08-08 ENCOUNTER — Encounter: Payer: Medicaid Other | Attending: Physician Assistant

## 2019-08-14 ENCOUNTER — Telehealth: Payer: Self-pay

## 2019-08-14 ENCOUNTER — Telehealth: Payer: Self-pay | Admitting: *Deleted

## 2019-08-14 ENCOUNTER — Inpatient Hospital Stay: Payer: Medicaid Other | Admitting: Oncology

## 2019-08-14 ENCOUNTER — Inpatient Hospital Stay: Payer: Medicaid Other

## 2019-08-14 NOTE — Telephone Encounter (Signed)
per pt request to cx 08/14/19 lab/MD appts due to not feeling well and stated that she will contact office at a later date to R/S

## 2019-08-14 NOTE — Telephone Encounter (Signed)
Capsule study results: normal Study except one small AVM. Follow up with Dr. Maximino Greenland.

## 2019-08-14 NOTE — Telephone Encounter (Signed)
Patient verbalized understanding of results  

## 2019-09-03 ENCOUNTER — Ambulatory Visit: Payer: Medicaid Other | Attending: Vascular Surgery

## 2019-09-03 ENCOUNTER — Other Ambulatory Visit: Payer: Self-pay

## 2019-09-03 DIAGNOSIS — M79661 Pain in right lower leg: Secondary | ICD-10-CM | POA: Diagnosis present

## 2019-09-03 DIAGNOSIS — R2689 Other abnormalities of gait and mobility: Secondary | ICD-10-CM | POA: Diagnosis present

## 2019-09-03 DIAGNOSIS — M6281 Muscle weakness (generalized): Secondary | ICD-10-CM | POA: Insufficient documentation

## 2019-09-03 DIAGNOSIS — S88111A Complete traumatic amputation at level between knee and ankle, right lower leg, initial encounter: Secondary | ICD-10-CM | POA: Diagnosis not present

## 2019-09-03 NOTE — Therapy (Signed)
[place] [facility] [address] [place], [place], [address] Phone: [phone]   Fax:  [phone]  Physical Therapy Evaluation  Patient Details  Name: Sylvia [name] MRN: [id number] Date of Birth: [birth year] No data recorded  Encounter Date: 09/03/2019  PT End of Session - 09/03/19 1320    Visit Number  1    Number of Visits  12    Date for PT Re-Evaluation  10/15/19    Authorization - Visit Number  1    PT Start Time  3149    PT Stop Time  1215    PT Time Calculation (min)  60 min    Equipment Utilized During Treatment  Other (comment)    Activity Tolerance  Patient tolerated treatment well;Patient limited by fatigue    Behavior During Therapy  [facility] for tasks assessed/performed       Past Medical History:  Diagnosis Date  . Anemia of chronic disease 05/16/2019  . Anxiety    h/o  . COPD (chronic obstructive pulmonary disease) ([place])   . Diabetes mellitus without complication ([place])   . GERD (gastroesophageal reflux disease)   . Hypertension    bp under control-off meds since 2019    Past Surgical History:  Procedure Laterality Date  . ABDOMINAL HYSTERECTOMY    . AMPUTATION Right 12/27/2018   Procedure: AMPUTATION BELOW KNEE;  Surgeon: Algernon Huxley, MD;  Location: [facility] ORS;  Service: General;  Laterality: Right;  . CHOLECYSTECTOMY    . COLONOSCOPY WITH PROPOFOL N/A 04/16/2019   Procedure: COLONOSCOPY WITH PROPOFOL;  Surgeon: Virgel Manifold, MD;  Location: [facility] ENDOSCOPY;  Service: Endoscopy;  Laterality: N/A;  . ESOPHAGOGASTRODUODENOSCOPY (EGD) WITH PROPOFOL N/A 06/15/2018   Procedure: ESOPHAGOGASTRODUODENOSCOPY (EGD) WITH PROPOFOL;  Surgeon: Toledo, Benay Pike, MD;  Location: [facility] ENDOSCOPY;  Service: Gastroenterology;  Laterality: N/A;  . ESOPHAGOGASTRODUODENOSCOPY (EGD) WITH PROPOFOL N/A 01/03/2019   Procedure: ESOPHAGOGASTRODUODENOSCOPY (EGD) WITH PROPOFOL;  Surgeon: Lucilla Lame, MD;  Location: [facility] ENDOSCOPY;  Service:  Endoscopy;  Laterality: N/A;  . GIVENS CAPSULE STUDY  04/16/2019   Procedure: GIVENS CAPSULE STUDY;  Surgeon: Virgel Manifold, MD;  Location: [facility] ENDOSCOPY;  Service: Endoscopy;;  . GIVENS CAPSULE STUDY N/A 06/25/2019   Procedure: GIVENS CAPSULE STUDY;  Surgeon: Jonathon Bellows, MD;  Location[facility] ENDOSCOPY;  Service: Gastroenterology;  Laterality: N/A;  . LOWER EXTREMITY ANGIOGRAPHY Right 08/21/2018   Procedure: LOWER EXTREMITY ANGIOGRAPHY;  Surgeon: Algernon Huxley, MD;  Location: [place] CV LAB;  Service: Cardiovascular;  Laterality: Right;  . LOWER EXTREMITY ANGIOGRAPHY Left 08/28/2018   Procedure: LOWER EXTREMITY ANGIOGRAPHY;  Surgeon: Algernon Huxley, MD;  Location: [place] CV LAB;  Service: Cardiovascular;  Laterality: Left;  . LOWER EXTREMITY ANGIOGRAPHY Right 08/28/2018   Procedure: Lower Extremity Angiography;  Surgeon: Algernon Huxley, MD;  Location: [place] CV LAB;  Service: Cardiovascular;  Laterality: Right;  . LOWER EXTREMITY ANGIOGRAPHY Right 12/18/2018   Procedure: Lower Extremity Angiography;  Surgeon: Algernon Huxley, MD;  Location: [place] CV LAB;  Service: Cardiovascular;  Laterality: Right;  . LOWER EXTREMITY ANGIOGRAPHY Right 12/18/2018   Procedure: Lower Extremity Angiography;  Surgeon: Algernon Huxley, MD;  Location: [place] CV LAB;  Service: Cardiovascular;  Laterality: Right;  . LOWER EXTREMITY ANGIOGRAPHY Left 12/21/2018   Procedure: Lower Extremity Angiography;  Surgeon: Algernon Huxley, MD;  Location: [place] CV LAB;  Service: Cardiovascular;  Laterality: Left;  . LOWER EXTREMITY ANGIOGRAPHY Right 12/21/2018   Procedure: Lower Extremity  Angiography;  Surgeon: Annice Needy, MD;  Location: [facility] INVASIVE CV LAB;  Service: Cardiovascular;  Laterality: Right;  . LOWER EXTREMITY INTERVENTION N/A 12/22/2018   Procedure: LOWER EXTREMITY INTERVENTION;  Surgeon: Annice Needy, MD;  Location: [facility] INVASIVE CV LAB;  Service: Cardiovascular;  Laterality: N/A;    There  were no vitals filed for this visit.   Subjective Assessment - 09/03/19 1144    Limitations  Sitting;Standing;Walking       Physical Therapy Evaluation Objective Findings:  Observation: Pt arrived wearing her LE prosthesis using front wheeled walker.  Leaning heavily on walker for support, not fully weight shifting onto R LE during stance.  Deferred removal of prosthesis as pt states it took her multiple attempts to don before arriving.    Sit to stand: 5x in 40 seconds today from table, requires UE support  Knee R AROM: flexion 100, extension -10 deg, tightness in hamstrings limiting full extension  Strength:  L LE strength: 5/5 hip flex, knee flex/ext, DF; 4/5 Hip ER, 4+/5 Hip IR, 4/5 extension R LE strength: 4/5 hip flexion, 3+/5 extension, 3+/5 flexion, Hip IR/ER (deferred today), hip extension 4-/5 (measured standing today)    Objective measurements completed on examination: See above findings.        PT Education - 09/03/19 1320    Education Details  importance of achieving full knee extension    Person(s) Educated  Patient    Methods  Explanation;Demonstration    Comprehension  Verbalized understanding;Need further instruction       PT Short Term Goals - 09/03/19 1329      PT SHORT TERM GOAL #1   Title  Improve R knee extension AROM to 0 degrees to promote optimal gait mechanics with prosthesis    Baseline  -10 degrees    Time  3    Period  Weeks    Status  New      PT SHORT TERM GOAL #2   Title  Pt will be able to tolerate 20 min standing interventions without requiring sitting rest break    Baseline  3 min    Time  3    Period  Weeks    Status  New        PT Long Term Goals - 09/03/19 1332      PT LONG TERM GOAL #1   Title  Pt will be able to ascend/descend 8 stairs independently with prosthesis    Baseline  unable    Time  6    Period  Weeks    Status  New      PT LONG TERM GOAL #2   Title  Pt will be able to stand x 1 hour wearing  prosthesis with intermittent rest break for cooking full meal in her kitchen    Time  6    Period  Weeks    Status  New      PT LONG TERM GOAL #3   Title  Improve FOTO score to >45/52 indicating improved overall activity tolerance    Baseline  31/52    Time  6    Period  Weeks    Status  New             Plan - 09/03/19 1321    Clinical Impression Statement  Pt is a 64 y/o with R LE below knee ampulation referred to PT for prosthetic evaluation.  She is an appropriate candidate for skilled PT to begin working on ROM,  strength, endurance, gait, and improving activity tolerance with her new LE prosthesis.    Personal Factors and Comorbidities  Comorbidity 2    Comorbidities  see med hx    Examination-Activity Limitations  Locomotion Level;Sit;Squat;Stand    Examination-Participation Restrictions  Meal Prep;Community Activity;Driving    Stability/Clinical Decision Making  Evolving/Moderate complexity    Clinical Decision Making  Moderate    Rehab Potential  Good    PT Frequency  2x / week    PT Duration  6 weeks    PT Treatment/Interventions  ADLs/Self Care Home Management;Neuromuscular re-education;Balance training;Therapeutic exercise;Therapeutic activities;Functional mobility training;Stair training;Gait training;Patient/family education;Prosthetic Training;Manual techniques;Energy conservation;Passive range of motion    PT Next Visit Plan  inspect LE/skin (deferred to 2nd visit) due to pt having reporting difficulty donning her prosthesis- multiple attempts required; focus on knee extension ROM and gt training next session       Patient will benefit from skilled therapeutic intervention in order to improve the following deficits and impairments:  Abnormal gait, Decreased coordination, Decreased range of motion, Difficulty walking, Prosthetic Dependency, Decreased endurance, Decreased activity tolerance, Decreased knowledge of precautions, Impaired perceived functional ability,  Pain, Decreased balance, Impaired flexibility, Postural dysfunction, Decreased strength, Decreased mobility  Visit Diagnosis: Below-knee amputation of right lower extremity (HCC)  Other abnormalities of gait and mobility  Muscle weakness (generalized)  Pain in right lower leg     Problem List Patient Active Problem List   Diagnosis Date Noted  . Cocaine abuse (HCC) 07/26/2019  . Diabetes mellitus without complication (HCC)   . GERD (gastroesophageal reflux disease)   . COPD (chronic obstructive pulmonary disease) (HCC)   . GIB (gastrointestinal bleeding)   . HLD (hyperlipidemia)   . Depression   . Elevated troponin   . Syncope   . Elevated troponin I level   . Anemia of chronic disease 05/16/2019  . Dizziness   . Polyp of colon   . Acute upper GI bleeding 04/13/2019  . Anemia associated with acute blood loss 04/13/2019  . Chronic anticoagulation 04/13/2019  . 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 Symptomatic anemia 04/13/2019  . Upper GI bleed 04/13/2019  . Acute GI bleeding 04/13/2019  . 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 Melena   . DU (duodenal ulcer)   . DKA (diabetic ketoacidoses) (HCC) 12/14/2018  . Atherosclerosis of artery of extremity with rest pain (HCC) 08/28/2018  . PAD (peripheral artery disease) (HCC) 07/14/2018  . Abdominal pain 06/13/2018  . HTN (hypertension) 06/13/2018  . Non-insulin dependent type 2 diabetes mellitus (HCC) 06/13/2018  . Elevated lactic acid level 06/13/2018    Ardine Bjork 09/03/2019, 1:35 PM Max Fickle, PT, DPT   [facility] [address] [place], [place], [address] Phone: [phone]   Fax:  [phone]  Name: Sylvia [name] MRN: [id number] Date of Birth: [birth year]

## 2019-09-04 ENCOUNTER — Ambulatory Visit: Payer: Medicaid Other | Admitting: Physical Therapy

## 2019-09-05 ENCOUNTER — Other Ambulatory Visit: Payer: Self-pay

## 2019-09-05 ENCOUNTER — Ambulatory Visit: Payer: Medicaid Other

## 2019-09-05 DIAGNOSIS — S88111A Complete traumatic amputation at level between knee and ankle, right lower leg, initial encounter: Secondary | ICD-10-CM

## 2019-09-05 DIAGNOSIS — M6281 Muscle weakness (generalized): Secondary | ICD-10-CM

## 2019-09-05 DIAGNOSIS — R2689 Other abnormalities of gait and mobility: Secondary | ICD-10-CM

## 2019-09-05 DIAGNOSIS — M79661 Pain in right lower leg: Secondary | ICD-10-CM

## 2019-09-05 NOTE — Therapy (Signed)
[facility] [address]. [place], [place], [address] Phone: [phone]   Fax:  [phone]  Physical Therapy Treatment  Patient Details  Name: Sylvia [name] MRN: [id number] Date of Birth: 06-May-1955 No data recorded  Encounter Date: 09/05/2019  PT End of Session - 09/05/19 0834    Visit Number  2    Number of Visits  12    Date for PT Re-Evaluation  10/15/19    Authorization - Visit Number  1    Authorization - Number of Visits  3    Progress Note Due on Visit  0.04    PT Start Time  0815    Equipment Utilized During Treatment  Other (comment)    Activity Tolerance  Patient tolerated treatment well;Patient limited by fatigue    Behavior During Therapy  [facility] for tasks assessed/performed       Past Medical History:  Diagnosis Date  . Anemia of chronic disease 05/16/2019  . Anxiety    h/o  . COPD (chronic obstructive pulmonary disease) ([place])   . Diabetes mellitus without complication ([place])   . GERD (gastroesophageal reflux disease)   . Hypertension    bp under control-off meds since 2019    Past Surgical History:  Procedure Laterality Date  . ABDOMINAL HYSTERECTOMY    . AMPUTATION Right 12/27/2018   Procedure: AMPUTATION BELOW KNEE;  Surgeon: Algernon Huxley, MD;  Location: [facility] ORS;  Service: General;  Laterality: Right;  . CHOLECYSTECTOMY    . COLONOSCOPY WITH PROPOFOL N/A 04/16/2019   Procedure: COLONOSCOPY WITH PROPOFOL;  Surgeon: Virgel Manifold, MD;  Location: [facility] ENDOSCOPY;  Service: Endoscopy;  Laterality: N/A;  . ESOPHAGOGASTRODUODENOSCOPY (EGD) WITH PROPOFOL N/A 06/15/2018   Procedure: ESOPHAGOGASTRODUODENOSCOPY (EGD) WITH PROPOFOL;  Surgeon: Toledo, Benay Pike, MD;  Location: [facility] ENDOSCOPY;  Service: Gastroenterology;  Laterality: N/A;  . ESOPHAGOGASTRODUODENOSCOPY (EGD) WITH PROPOFOL N/A 01/03/2019   Procedure: ESOPHAGOGASTRODUODENOSCOPY (EGD) WITH PROPOFOL;  Surgeon: Lucilla Lame, MD;  Location: [facility]  ENDOSCOPY;  Service: Endoscopy;  Laterality: N/A;  . GIVENS CAPSULE STUDY  04/16/2019   Procedure: GIVENS CAPSULE STUDY;  Surgeon: Virgel Manifold, MD;  Location: [facility] ENDOSCOPY;  Service: Endoscopy;;  . GIVENS CAPSULE STUDY N/A 06/25/2019   Procedure: GIVENS CAPSULE STUDY;  Surgeon: Jonathon Bellows, MD;  Location: [facility] ENDOSCOPY;  Service: Gastroenterology;  Laterality: N/A;  . LOWER EXTREMITY ANGIOGRAPHY Right 08/21/2018   Procedure: LOWER EXTREMITY ANGIOGRAPHY;  Surgeon: Algernon Huxley, MD;  Location: [name] CV LAB;  Service: Cardiovascular;  Laterality: Right;  . LOWER EXTREMITY ANGIOGRAPHY Left 08/28/2018   Procedure: LOWER EXTREMITY ANGIOGRAPHY;  Surgeon: Algernon Huxley, MD;  Location: [place] CV LAB;  Service: Cardiovascular;  Laterality: Left;  . LOWER EXTREMITY ANGIOGRAPHY Right 08/28/2018   Procedure: Lower Extremity Angiography;  Surgeon: Algernon Huxley, MD;  Location: [place] CV LAB;  Service: Cardiovascular;  Laterality: Right;  . LOWER EXTREMITY ANGIOGRAPHY Right 12/18/2018   Procedure: Lower Extremity Angiography;  Surgeon: Algernon Huxley, MD;  Location: [place] CV LAB;  Service: Cardiovascular;  Laterality: Right;  . LOWER EXTREMITY ANGIOGRAPHY Right 12/18/2018   Procedure: Lower Extremity Angiography;  Surgeon: Algernon Huxley, MD;  Location: [place] CV LAB;  Service: Cardiovascular;  Laterality: Right;  . LOWER EXTREMITY ANGIOGRAPHY Left 12/21/2018   Procedure: Lower Extremity Angiography;  Surgeon: Algernon Huxley, MD;  Location: [place] CV LAB;  Service: Cardiovascular;  Laterality: Left;  . LOWER EXTREMITY ANGIOGRAPHY Right 12/21/2018   Procedure:  Lower Extremity Angiography;  Surgeon: Annice Needy, MD;  Location: [facility] IN[facility] CV LAB;  Service: Cardiovascular;  Laterality: Right;  . LOWER EXTREMITY INTERVENTION N/A 12/22/2018   Procedure: LOWER EXTREMITY INTERVENTION;  Surgeon: Annice Needy, MD;  Location: [facility] INV[facility] CV LAB;  Service: Cardiovascular;   Laterality: N/A;    There were no vitals filed for this visit.  Subjective Assessment - 09/05/19 0803    Subjective  Pt reports she's feeling pretty good this morning.  Yesterday she wore her prosthesis for about an hour.    Pertinent History  pt fell 6x before she got her prosthesis; 12/20/18 pt had below knee ampulation due to infection/gangrene    Limitations  Sitting;Standing;Walking    How long can you sit comfortably?  1 hour    How long can you stand comfortably?  10 minutes (with prosthesis)    How long can you walk comfortably?  A few minutes with some short rest breaks in standing    Patient Stated Goals  to be able to do 7-8 steps to get in her home.  She has a ramp she can use now; to be able to walk again without AD.  She wants to be able to stand 1.5 hrs for cooking in her kitchen.         Treatment today: Pt arrived wearing her prosthesis  Removed prosthesis and transferred to tx table with FWW independently.   Therex: Supine quad set 5" x 20 Knee extension with PT stretch 5x20 sec SLR x20 Pt donned prosthesis- took pt 4+ attempts Sit to stand x5 Standing marches and hip flexion: x10 ea R LE, then x10 ea alternating with mirror   Gt training: Amb 25 ft with FWW x2 Amb in parallel bars with mirror for visual feedback and PT cues for hip flexion on R, heel strike on R, widen base of support      Access Code: [email or web address] URL: [email or web address] Date: 09/05/2019 Prepared by: Max Fickle  Exercises Standing March with Counter Support - 1 x daily - 2 sets - 10 reps Standing Alternating Knee Flexion - 1 x daily - 2 sets - 10 reps        PT Short Term Goals - 09/03/19 1329      PT SHORT TERM GOAL #1   Title  Improve R knee extension AROM to 0 degrees to promote optimal gait mechanics with prosthesis    Baseline  -10 degrees    Time  3    Period  Weeks    Status  New      PT SHORT TERM GOAL #2   Title  Pt will be able to tolerate 20  min standing interventions without requiring sitting rest break    Baseline  3 min    Time  3    Period  Weeks    Status  New        PT Long Term Goals - 09/03/19 1332      PT LONG TERM GOAL #1   Title  Pt will be able to ascend/descend 8 stairs independently with prosthesis    Baseline  unable    Time  6    Period  Weeks    Status  New      PT LONG TERM GOAL #2   Title  Pt will be able to stand x 1 hour wearing prosthesis with intermittent rest break for cooking full meal in her kitchen    Time  6  Period  Weeks    Status  New      PT LONG TERM GOAL #3   Title  Improve FOTO score to >45/52 indicating improved overall activity tolerance    Baseline  31/52    Time  6    Period  Weeks    Status  New              Patient will benefit from skilled therapeutic intervention in order to improve the following deficits and impairments:     Visit Diagnosis: Below-knee amputation of right lower extremity (HCC)  Other abnormalities of gait and mobility  Muscle weakness (generalized)  Pain in right lower leg     Problem List Patient Active Problem List   Diagnosis Date Noted  . Cocaine abuse (HCC) 07/26/2019  . Diabetes mellitus without complication (HCC)   . GERD (gastroesophageal reflux disease)   . COPD (chronic obstructive pulmonary disease) (HCC)   . GIB (gastrointestinal bleeding)   . HLD (hyperlipidemia)   . Depression   . Elevated troponin   . Syncope   . Elevated troponin I level   . Anemia of chronic disease 05/16/2019  . Dizziness   . Polyp of colon   . Acute upper GI bleeding 04/13/2019  . Anemia associated with acute blood loss 04/13/2019  . Chronic anticoagulation 04/13/2019  . 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 Symptomatic anemia 04/13/2019  . Upper GI bleed 04/13/2019  . Acute GI bleeding 04/13/2019  . 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 Melena   . DU (duodenal ulcer)   . DKA (diabetic ketoacidoses)  (HCC) 12/14/2018  . Atherosclerosis of artery of extremity with rest pain (HCC) 08/28/2018  . PAD (peripheral artery disease) (HCC) 07/14/2018  . Abdominal pain 06/13/2018  . HTN (hypertension) 06/13/2018  . Non-insulin dependent type 2 diabetes mellitus (HCC) 06/13/2018  . Elevated lactic acid level 06/13/2018    Ardine Bjork PT, DPT 09/05/2019, 8:35 AM  [facility] [address]. [place], [place], [address] Phone: [phone]   Fax:  [phone]  Name: Sylvia [name] MRN: [id number] Date of Birth: 05/11/1955

## 2019-09-10 ENCOUNTER — Ambulatory Visit: Payer: Medicaid Other

## 2019-09-10 ENCOUNTER — Other Ambulatory Visit: Payer: Self-pay

## 2019-09-10 DIAGNOSIS — S88111A Complete traumatic amputation at level between knee and ankle, right lower leg, initial encounter: Secondary | ICD-10-CM | POA: Diagnosis not present

## 2019-09-10 DIAGNOSIS — M79661 Pain in right lower leg: Secondary | ICD-10-CM

## 2019-09-10 DIAGNOSIS — M6281 Muscle weakness (generalized): Secondary | ICD-10-CM

## 2019-09-10 DIAGNOSIS — R2689 Other abnormalities of gait and mobility: Secondary | ICD-10-CM

## 2019-09-10 NOTE — Therapy (Signed)
[facility] [address]. [place], [place], [address] Phone: [phone]   Fax:  [phone]  Physical Therapy Treatment  Patient Details  Name: Sylvia [name] MRN: [id number] Date of Birth: 06/06/1955 No data recorded  Encounter Date: 09/10/2019  PT End of Session - 09/10/19 1230    Visit Number  3    Number of Visits  12    Date for PT Re-Evaluation  10/15/19    Authorization - Visit Number  2    Authorization - Number of Visits  3    Progress Note Due on Visit  4    PT Start Time  0800    PT Stop Time  0845    PT Time Calculation (min)  45 min    Equipment Utilized During Treatment  Other (comment)    Activity Tolerance  Patient tolerated treatment well;Patient limited by fatigue    Behavior During Therapy  [facility] for tasks assessed/performed       Past Medical History:  Diagnosis Date  . Anemia of chronic disease 05/16/2019  . Anxiety    h/o  . COPD (chronic obstructive pulmonary disease) (HCC)   . Diabetes mellitus without complication (HCC)   . GERD (gastroesophageal reflux disease)   . Hypertension    bp under control-off meds since 2019    Past Surgical History:  Procedure Laterality Date  . ABDOMINAL HYSTERECTOMY    . AMPUTATION Right 12/27/2018   Procedure: AMPUTATION BELOW KNEE;  Surgeon: Annice Needy, MD;  Location: [facility] ORS;  Service: General;  Laterality: Right;  . CHOLECYSTECTOMY    . COLONOSCOPY WITH PROPOFOL N/A 04/16/2019   Procedure: COLONOSCOPY WITH PROPOFOL;  Surgeon: Pasty Spillers, MD;  Location: [facility] ENDOSCOPY;  Service: Endoscopy;  Laterality: N/A;  . ESOPHAGOGASTRODUODENOSCOPY (EGD) WITH PROPOFOL N/A 06/15/2018   Procedure: ESOPHAGOGASTRODUODENOSCOPY (EGD) WITH PROPOFOL;  Surgeon: Toledo, Boykin Nearing, MD;  Location: [facility] ENDOSCOPY;  Service: Gastroenterology;  Laterality: N/A;  . ESOPHAGOGASTRODUODENOSCOPY (EGD) WITH PROPOFOL N/A 01/03/2019   Procedure: ESOPHAGOGASTRODUODENOSCOPY (EGD) WITH  PROPOFOL;  Surgeon: Midge Minium, MD;  Location: [facility];  Service: Endoscopy;  Laterality: N/A;  . GIVENS CAPSULE STUDY  04/16/2019   Procedure: GIVENS CAPSULE STUDY;  Surgeon: Pasty Spillers, MD;  Location: [facility] ENDOSCOPY;  Service: Endoscopy;;  . GIVENS CAPSULE STUDY N/A 06/25/2019   Procedure: GIVENS CAPSULE STUDY;  Surgeon: Wyline Mood, MD;  Location: [facility] ENDOSCOPY;  Service: Gastroenterology;  Laterality: N/A;  . LOWER EXTREMITY ANGIOGRAPHY Right 08/21/2018   Procedure: LOWER EXTREMITY ANGIOGRAPHY;  Surgeon: Annice Needy, MD;  Location: [facility] CV LAB;  Service: Cardiovascular;  Laterality: Right;  . LOWER EXTREMITY ANGIOGRAPHY Left 08/28/2018   Procedure: LOWER EXTREMITY ANGIOGRAPHY;  Surgeon: Annice Needy, MD;  Location: [facility];  Service: Cardiovascular;  Laterality: Left;  . LOWER EXTREMITY ANGIOGRAPHY Right 08/28/2018   Procedure: Lower Extremity Angiography;  Surgeon: Annice Needy, MD;  Location: [facility] LAB;  Service: Cardiovascular;  Laterality: Right;  . LOWER EXTREMITY ANGIOGRAPHY Right 12/18/2018   Procedure: Lower Extremity Angiography;  Surgeon: Annice Needy, MD;  Location: [facility] IN[facility] CV LAB;  Service: Cardiovascular;  Laterality: Right;  . LOWER EXTREMITY ANGIOGRAPHY Right 12/18/2018   Procedure: Lower Extremity Angiography;  Surgeon: Annice Needy, MD;  Location: [facility] CV LAB;  Service: Cardiovascular;  Laterality: Right;  . LOWER EXTREMITY ANGIOGRAPHY Left 12/21/2018   Procedure: Lower Extremity Angiography;  Surgeon: Annice Needy, MD;  Location: [facility] INVASIVE  CV LAB;  Service: Cardiovascular;  Laterality: Left;  . LOWER EXTREMITY ANGIOGRAPHY Right 12/21/2018   Procedure: Lower Extremity Angiography;  Surgeon: Algernon Huxley, MD;  Location: [place] CV LAB;  Service: Cardiovascular;  Laterality: Right;  . LOWER EXTREMITY INTERVENTION N/A 12/22/2018   Procedure: LOWER EXTREMITY INTERVENTION;  Surgeon: Algernon Huxley, MD;  Location: [place] CV LAB;  Service: Cardiovascular;  Laterality: N/A;    There were no vitals filed for this visit.  Subjective Assessment - 09/10/19 0835    Subjective  Pt states she is not having any pain in her knee this morning upon arrival.  She tried her HEP, it was challenging.        Treatment today: Pt arrived wearing her prosthesis  Gt training: Amb 25 ft with FWW x4 Amb in parallel bars with mirror for visual feedback and PT cues for hip flexion on R, heel strike on R, widen base of support 360 deg turns in bars R and L 4x ea Step over hurdle (small) with R leg, focused on knee extension during stance on R   Therex: Supine quad set 5" x 20 SAQ (green roll under legs) 2x10 S/L hip and x15 Knee extension with PT stretch 5x20 sec SLR x20- not today Sit to stand 4x5 with airex on chair, focused on symmetrical weight bearing and actively extending R knee Standing marches and hip flexion: x10 ea R LE, then x10 ea alternating with mirror   Practiced donning prosthesis with adding 3 ply sock instead of 1 ply today at end of session   PT Education - 09/10/19 1229    Education Details  using 3 ply sock in socket now for more optimal fit of prosthesis    Person(s) Educated  Patient    Methods  Explanation;Demonstration    Comprehension  Returned demonstration;Verbalized understanding       PT Short Term Goals - 09/03/19 1329      PT SHORT TERM GOAL #1   Title  Improve R knee extension AROM to 0 degrees to promote optimal gait mechanics with prosthesis    Baseline  -10 degrees    Time  3    Period  Weeks    Status  New      PT SHORT TERM GOAL #2   Title  Pt will be able to tolerate 20 min standing interventions without requiring sitting rest break    Baseline  3 min    Time  3    Period  Weeks    Status  New        PT Long Term Goals - 09/03/19 1332      PT LONG TERM GOAL #1   Title  Pt will be able to ascend/descend 8 stairs independently with prosthesis     Baseline  unable    Time  6    Period  Weeks    Status  New      PT LONG TERM GOAL #2   Title  Pt will be able to stand x 1 hour wearing prosthesis with intermittent rest break for cooking full meal in her kitchen    Time  6    Period  Weeks    Status  New      PT LONG TERM GOAL #3   Title  Improve FOTO score to >45/52 indicating improved overall activity tolerance    Baseline  31/52    Time  6    Period  Weeks  Status  New            Plan - 09/10/19 1230    Clinical Impression Statement  Improved ability to weight shift onto R LE noted during gait training today in parallel bars.  Pt's also able to perform sit to stand with more even weight distribution between R/L LE's.  Standing tolerance today during exercises was 17 minutes.    Personal Factors and Comorbidities  Comorbidity 2    Comorbidities  see med hx    Examination-Activity Limitations  Locomotion Level;Sit;Squat;Stand    Examination-Participation Restrictions  Meal Prep;Community Activity;Driving    Stability/Clinical Decision Making  Evolving/Moderate complexity    Rehab Potential  Good    PT Frequency  2x / week    PT Duration  6 weeks    PT Treatment/Interventions  ADLs/Self Care Home Management;Neuromuscular re-education;Balance training;Therapeutic exercise;Therapeutic activities;Functional mobility training;Stair training;Gait training;Patient/family education;Prosthetic Training;Manual techniques;Energy conservation;Passive range of motion    PT Next Visit Plan  cont progressing LE strengthening, gt training, balance    PT Home Exercise Plan  added marches, hamstring curl alternating, seated knee extension with LE propped on stool       Patient will benefit from skilled therapeutic intervention in order to improve the following deficits and impairments:  Abnormal gait, Decreased coordination, Decreased range of motion, Difficulty walking, Prosthetic Dependency, Decreased endurance, Decreased activity  tolerance, Decreased knowledge of precautions, Impaired perceived functional ability, Pain, Decreased balance, Impaired flexibility, Postural dysfunction, Decreased strength, Decreased mobility  Visit Diagnosis: Below-knee amputation of right lower extremity (HCC)  Other abnormalities of gait and mobility  Muscle weakness (generalized)  Pain in right lower leg     Problem List Patient Active Problem List   Diagnosis Date Noted  . Cocaine abuse (HCC) 07/26/2019  . Diabetes mellitus without complication (HCC)   . GERD (gastroesophageal reflux disease)   . COPD (chronic obstructive pulmonary disease) (HCC)   . GIB (gastrointestinal bleeding)   . HLD (hyperlipidemia)   . Depression   . Elevated troponin   . Syncope   . Elevated troponin I level   . Anemia of chronic disease 05/16/2019  . Dizziness   . Polyp of colon   . Acute upper GI bleeding 04/13/2019  . Anemia associated with acute blood loss 04/13/2019  . Chronic anticoagulation 04/13/2019  . 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 Symptomatic anemia 04/13/2019  . Upper GI bleed 04/13/2019  . Acute GI bleeding 04/13/2019  . 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 Melena   . DU (duodenal ulcer)   . DKA (diabetic ketoacidoses) (HCC) 12/14/2018  . Atherosclerosis of artery of extremity with rest pain (HCC) 08/28/2018  . PAD (peripheral artery disease) (HCC) 07/14/2018  . Abdominal pain 06/13/2018  . HTN (hypertension) 06/13/2018  . Non-insulin dependent type 2 diabetes mellitus (HCC) 06/13/2018  . Elevated lactic acid level 06/13/2018    Ardine Bjork, PT, DPT 09/10/2019, 12:33 PM  [facility] [address] [place], [place], [address] Phone: [phone]   Fax:  [phone]  Name: Rilei Kravitz MRN: [id number] Date of Birth: 03-14-1956

## 2019-09-12 ENCOUNTER — Other Ambulatory Visit: Payer: Self-pay

## 2019-09-12 ENCOUNTER — Ambulatory Visit: Payer: Medicaid Other

## 2019-09-12 ENCOUNTER — Ambulatory Visit: Payer: Medicaid Other | Admitting: Gastroenterology

## 2019-09-25 ENCOUNTER — Other Ambulatory Visit (INDEPENDENT_AMBULATORY_CARE_PROVIDER_SITE_OTHER): Payer: Self-pay | Admitting: Nurse Practitioner

## 2019-09-25 DIAGNOSIS — I739 Peripheral vascular disease, unspecified: Secondary | ICD-10-CM

## 2019-09-26 ENCOUNTER — Other Ambulatory Visit: Payer: Self-pay

## 2019-09-26 ENCOUNTER — Ambulatory Visit: Payer: Medicaid Other | Attending: Vascular Surgery

## 2019-09-26 ENCOUNTER — Telehealth: Payer: Self-pay | Admitting: Pharmacy Technician

## 2019-09-26 DIAGNOSIS — S88111A Complete traumatic amputation at level between knee and ankle, right lower leg, initial encounter: Secondary | ICD-10-CM | POA: Diagnosis not present

## 2019-09-26 DIAGNOSIS — M6281 Muscle weakness (generalized): Secondary | ICD-10-CM | POA: Insufficient documentation

## 2019-09-26 DIAGNOSIS — M79661 Pain in right lower leg: Secondary | ICD-10-CM | POA: Insufficient documentation

## 2019-09-26 DIAGNOSIS — R2689 Other abnormalities of gait and mobility: Secondary | ICD-10-CM | POA: Diagnosis present

## 2019-09-26 NOTE — Telephone Encounter (Signed)
Received updated proof of income.  Patient eligible to receive medication assistance at [facility] until 12/19/19.  Patient will have Medicare beginning 12/19/19.   Sherilyn Dacosta Care Manager [facility]

## 2019-09-26 NOTE — Therapy (Signed)
[facility] [address] [place], [place], [address] Phone: [phone]   Fax:  [phone]  Physical Therapy Treatment  Patient Details  Name: Sylvia [name] MRN: [id number] Date of Birth: [birth year] No data recorded  Encounter Date: 09/26/2019  PT End of Session - 09/26/19 1153    Visit Number  4    Number of Visits  12    Date for PT Re-Evaluation  10/15/19    Authorization - Visit Number  3    Authorization - Number of Visits  12    Progress Note Due on Visit  4    PT Start Time  0900    PT Stop Time  0945    PT Time Calculation (min)  45 min    Equipment Utilized During Treatment  Other (comment)    Activity Tolerance  Patient tolerated treatment well;Patient limited by fatigue    Behavior During Therapy  [facility] for tasks assessed/performed       Past Medical History:  Diagnosis Date   Anemia of chronic disease 05/16/2019   Anxiety    h/o   COPD (chronic obstructive pulmonary disease) (HCC)    Diabetes mellitus without complication (HCC)    GERD (gastroesophageal reflux disease)    Hypertension    bp under control-off meds since 2019    Past Surgical History:  Procedure Laterality Date   ABDOMINAL HYSTERECTOMY     AMPUTATION Right 12/27/2018   Procedure: AMPUTATION BELOW KNEE;  Surgeon: Annice Needy, MD;  Location: [facility] ORS;  Service: General;  Laterality: Right;   CHOLECYSTECTOMY     COLONOSCOPY WITH PROPOFOL N/A 04/16/2019   Procedure: COLONOSCOPY WITH PROPOFOL;  Surgeon: Pasty Spillers, MD;  Location: [facility] ENDOSCOPY;  Service: Endoscopy;  Laterality: N/A;   ESOPHAGOGASTRODUODENOSCOPY (EGD) WITH PROPOFOL N/A 06/15/2018   Procedure: ESOPHAGOGASTRODUODENOSCOPY (EGD) WITH PROPOFOL;  Surgeon: Toledo, Boykin Nearing, MD;  Location: [facility] ENDOSCOPY;  Service: Gastroenterology;  Laterality: N/A;   ESOPHAGOGASTRODUODENOSCOPY (EGD) WITH PROPOFOL N/A 01/03/2019   Procedure: ESOPHAGOGASTRODUODENOSCOPY (EGD) WITH  PROPOFOL;  Surgeon: Midge Minium, MD;  Location: [facility] ENDOSCOPY;  Service: Endoscopy;  Laterality: N/A;   GIVENS CAPSULE STUDY  04/16/2019   Procedure: GIVENS CAPSULE STUDY;  Surgeon: Pasty Spillers, MD;  Location: [facility] ENDOSCOPY;  Service: Endoscopy;;   GIVENS CAPSULE STUDY N/A 06/25/2019   Procedure: GIVENS CAPSULE STUDY;  Surgeon: Wyline Mood, MD;  Location: [facility] ENDOSCOPY;  Service: Gastroenterology;  Laterality: N/A;   LOWER EXTREMITY ANGIOGRAPHY Right 08/21/2018   Procedure: LOWER EXTREMITY ANGIOGRAPHY;  Surgeon: Annice Needy, MD;  Location: [facility] INVASIVE CV LAB;  Service: Cardiovascular;  Laterality: Right;   LOWER EXTREMITY ANGIOGRAPHY Left 08/28/2018   Procedure: LOWER EXTREMITY ANGIOGRAPHY;  Surgeon: Annice Needy, MD;  Location: [facility] INVASIVE CV LAB;  Service: Cardiovascular;  Laterality: Left;   LOWER EXTREMITY ANGIOGRAPHY Right 08/28/2018   Procedure: Lower Extremity Angiography;  Surgeon: Annice Needy, MD;  Location: [facility] INVASIVE CV LAB;  Service: Cardiovascular;  Laterality: Right;   LOWER EXTREMITY ANGIOGRAPHY Right 12/18/2018   Procedure: Lower Extremity Angiography;  Surgeon: Annice Needy, MD;  Location: [facility] INVASIVE CV LAB;  Service: Cardiovascular;  Laterality: Right;   LOWER EXTREMITY ANGIOGRAPHY Right 12/18/2018   Procedure: Lower Extremity Angiography;  Surgeon: Annice Needy, MD;  Location: [facility] INVASIVE CV LAB;  Service: Cardiovascular;  Laterality: Right;   LOWER EXTREMITY ANGIOGRAPHY Left 12/21/2018   Procedure: Lower Extremity Angiography;  Surgeon: Annice Needy, MD;  Location: [facility] INVASIVE  CV LAB;  Service: Cardiovascular;  Laterality: Left;   LOWER EXTREMITY ANGIOGRAPHY Right 12/21/2018   Procedure: Lower Extremity Angiography;  Surgeon: Algernon Huxley, MD;  Location: [place] CV LAB;  Service: Cardiovascular;  Laterality: Right;   LOWER EXTREMITY INTERVENTION N/A 12/22/2018   Procedure: LOWER EXTREMITY INTERVENTION;  Surgeon: Algernon Huxley, MD;  Location: [place] CV LAB;  Service: Cardiovascular;  Laterality: N/A;    There were no vitals filed for this visit.  Subjective Assessment - 09/26/19 0912    Subjective  Pt reports she has had her prosthesis on for an hour prior to arriving at PT.  She has been having some phantom limb pain in her "ankle" this week.  She walked with her prosthesis from vehicle into [employer] using a cart to hold onto and half way into store before needing to remove her prosthesis and use motorized cart this week.  Her family member assisted her with this outing.    Pertinent History  pt fell 6x before she got her prosthesis; 12/20/18 pt had below knee ampulation due to infection/gangrene    Limitations  Sitting;Standing;Walking    How long can you sit comfortably?  1 hour    How long can you stand comfortably?  10 minutes (with prosthesis)    How long can you walk comfortably?  A few minutes with some short rest breaks in standing    Patient Stated Goals  to be able to do 7-8 steps to get in her home.  She has a ramp she can use now; to be able to walk again without AD.  She wants to be able to stand 1.5 hrs for cooking in her kitchen.    Currently in Pain?  Yes    Pain Location  Knee    Pain Orientation  Right       Treatment Today: Pt arrived wearing her prosthesis  Therex: Sit to stand 4x5, focused on symmetrical weight bearing through pelvis, upright trunk, and actively extending R knee *Standing marches:x10 R and x10 L(WB through R) *Standing hip abd: x20 R, x10 R (WB through R) *Standing hamstring curl: x20 R, x10L (WB on R) *(these standing exercises in parallel bars were very challenging for pt- she became emotional/tearful during session, PT offered support via active listening, then pt able to continue tx after short break) Seated knee extension (with prosthesis) 2x10 R Amb 25 ft with FWWx4  Pt removed prosthesis at this point in session- skin observation- good skin integrity along R distal limb  Knee  extension with PT stretch 5x20 sec Hamstring stretch 3x30 sec with PT  Pt kept prosthesis off at end of session     PT Education - 09/26/19 1150    Education Details  pt ed for self STM to address phantom limb pain    Person(s) Educated  Patient    Methods  Explanation    Comprehension  Verbalized understanding       PT Short Term Goals - 09/03/19 1329      PT SHORT TERM GOAL #1   Title  Improve R knee extension AROM to 0 degrees to promote optimal gait mechanics with prosthesis    Baseline  -10 degrees    Time  3    Period  Weeks    Status  New      PT SHORT TERM GOAL #2   Title  Pt will be able to tolerate 20 min standing interventions without requiring sitting rest break  Baseline  3 min    Time  3    Period  Weeks    Status  New        PT Long Term Goals - 09/03/19 1332      PT LONG TERM GOAL #1   Title  Pt will be able to ascend/descend 8 stairs independently with prosthesis    Baseline  unable    Time  6    Period  Weeks    Status  New      PT LONG TERM GOAL #2   Title  Pt will be able to stand x 1 hour wearing prosthesis with intermittent rest break for cooking full meal in her kitchen    Time  6    Period  Weeks    Status  New      PT LONG TERM GOAL #3   Title  Improve FOTO score to >45/52 indicating improved overall activity tolerance    Baseline  31/52    Time  6    Period  Weeks    Status  New            Plan - 09/26/19 1155    Clinical Impression Statement  Pt was challenged today with progression in LE exercises that focused on WB through R LE in prosthesis.  PT raised height of her FWW to help facilitate more upright posture during amb.    Personal Factors and Comorbidities  Comorbidity 2    Comorbidities  see med hx    Examination-Activity Limitations  Locomotion Level;Sit;Squat;Stand    Examination-Participation Restrictions  Meal Prep;Community Activity;Driving    Stability/Clinical Decision Making  Evolving/Moderate complexity     Rehab Potential  Good    PT Frequency  2x / week    PT Duration  6 weeks    PT Treatment/Interventions  ADLs/Self Care Home Management;Neuromuscular re-education;Balance training;Therapeutic exercise;Therapeutic activities;Functional mobility training;Stair training;Gait training;Patient/family education;Prosthetic Training;Manual techniques;Energy conservation;Passive range of motion    PT Next Visit Plan  cont progressing LE strengthening, gt training, balance    PT Home Exercise Plan  kept previous HEP, added sit to stand with emphasis on normal posture through trunk/pelvis/LE in standing 2x5    Consulted and Agree with Plan of Care  Patient       Patient will benefit from skilled therapeutic intervention in order to improve the following deficits and impairments:  Abnormal gait, Decreased coordination, Decreased range of motion, Difficulty walking, Prosthetic Dependency, Decreased endurance, Decreased activity tolerance, Decreased knowledge of precautions, Impaired perceived functional ability, Pain, Decreased balance, Impaired flexibility, Postural dysfunction, Decreased strength, Decreased mobility  Visit Diagnosis: Below-knee amputation of right lower extremity (HCC)  Other abnormalities of gait and mobility  Muscle weakness (generalized)  Pain in right lower leg     Problem List Patient Active Problem List   Diagnosis Date Noted   Cocaine abuse (HCC) 07/26/2019   Diabetes mellitus without complication (HCC)    GERD (gastroesophageal reflux disease)    COPD (chronic obstructive pulmonary disease) (HCC)    GIB (gastrointestinal bleeding)    HLD (hyperlipidemia)    Depression    Elevated troponin    Syncope    Elevated troponin I level    Anemia of chronic disease 05/16/2019   Dizziness    Polyp of colon    Acute upper GI bleeding 04/13/2019   Anemia associated with acute blood loss 04/13/2019   Chronic anticoagulation 04/13/2019   Hx of right BKA  (HCC) 04/13/2019   Symptomatic  anemia 04/13/2019   Upper GI bleed 04/13/2019   Acute GI bleeding 04/13/2019   Phantom pain after amputation of lower extremity (HCC) 01/30/2019   Melena    DU (duodenal ulcer)    DKA (diabetic ketoacidoses) (HCC) 12/14/2018   Atherosclerosis of artery of extremity with rest pain (HCC) 08/28/2018   PAD (peripheral artery disease) (HCC) 07/14/2018   Abdominal pain 06/13/2018   HTN (hypertension) 06/13/2018   Non-insulin dependent type 2 diabetes mellitus (HCC) 06/13/2018   Elevated lactic acid level 06/13/2018    Ardine Bjork 09/26/2019, 11:57 AM Max Fickle, PT, DPT  [facility] [address]. [place], [place], [address] Phone: [phone]   Fax:  [phone]  Name: Kalis Friese MRN: [id number] Date of Birth: [birth year]

## 2019-09-27 ENCOUNTER — Telehealth: Payer: Self-pay | Admitting: Pharmacy Technician

## 2019-09-27 NOTE — Telephone Encounter (Signed)
Insurance check revealed patient has Medicaid with prescription drug coverage.  No longer meets [facility]'s eligibility criteria.  Patient notified by letter.   [address] [place], [place]  [address]     This is to inform you that you are no longer eligible to receive medication assistance at [facility].  The reason(s) are:    _____Your total gross monthly household income exceeds 250% of the Federal Poverty Level.   _____Tangible assets (savings, checking, stocks/bonds, pension, retirement, etc.) exceeds our limit  __X__You are eligible to receive benefits from [facility], [facility] or HIV Medication            Assistance program _____You are eligible to receive benefits from a Medicare Part "D" plan _____You have prescription insurance  _____You are not an [facility] resident _____Failure to provide all requested proof of income information for 2021.    We regret that we are unable to help you at this time.  If your prescription coverage is terminated, please contact [facility], so that we may reassess your eligibility for our program.  If you have questions, we may be contacted at [phone].  Thank you,  [facility]  Sherilyn Dacosta Care Manager [facility]

## 2019-09-28 ENCOUNTER — Telehealth: Payer: Self-pay | Admitting: Pharmacist

## 2019-09-28 NOTE — Telephone Encounter (Signed)
Ms. Trostle called in Rx refills.  She no longer qualifies for services at [facility]. All Rx's have been transferred to her local pharmacy. I made the patient aware that she should no longer be taking the aspirin. This was discontinued in April per Epic. She is still taking clopidogrel and Eliquis.  Manmeet Arzola K. Joelene Millin, PharmD [facility] Clinic-Pharmacy Operations Coordinator [phone]

## 2019-10-01 ENCOUNTER — Other Ambulatory Visit: Payer: Self-pay

## 2019-10-01 ENCOUNTER — Ambulatory Visit: Payer: Medicaid Other

## 2019-10-01 DIAGNOSIS — M6281 Muscle weakness (generalized): Secondary | ICD-10-CM

## 2019-10-01 DIAGNOSIS — S88111A Complete traumatic amputation at level between knee and ankle, right lower leg, initial encounter: Secondary | ICD-10-CM

## 2019-10-01 DIAGNOSIS — R2689 Other abnormalities of gait and mobility: Secondary | ICD-10-CM

## 2019-10-01 DIAGNOSIS — M79661 Pain in right lower leg: Secondary | ICD-10-CM

## 2019-10-01 NOTE — Therapy (Signed)
[place] [facility] [address] [place], [place], [address] Phone: [phone]   Fax:  [phone]  Physical Therapy Treatment  Patient Details  Name: Sylvia [name] MRN: [id number] Date of Birth: Aug 08, 1955 No data recorded  Encounter Date: 10/01/2019   PT End of Session - 10/01/19 0929    Visit Number 5    Number of Visits 12    Date for PT Re-Evaluation 10/15/19    Authorization - Visit Number 4    Authorization - Number of Visits 12    Progress Note Due on Visit 4    PT Start Time 0835    PT Stop Time 0920    PT Time Calculation (min) 45 min    Equipment Utilized During Treatment Other (comment)    Activity Tolerance Patient tolerated treatment well;Patient limited by fatigue    Behavior During Therapy [facility] for tasks assessed/performed           Past Medical History:  Diagnosis Date  . Anemia of chronic disease 05/16/2019  . Anxiety    h/o  . COPD (chronic obstructive pulmonary disease) (HCC)   . Diabetes mellitus without complication (HCC)   . GERD (gastroesophageal reflux disease)   . Hypertension    bp under control-off meds since 2019    Past Surgical History:  Procedure Laterality Date  . ABDOMINAL HYSTERECTOMY    . AMPUTATION Right 12/27/2018   Procedure: AMPUTATION BELOW KNEE;  Surgeon: Annice Needy, MD;  Location: [facility] ORS;  Service: General;  Laterality: Right;  . CHOLECYSTECTOMY    . COLONOSCOPY WITH PROPOFOL N/A 04/16/2019   Procedure: COLONOSCOPY WITH PROPOFOL;  Surgeon: Pasty Spillers, MD;  Location: [facility] ENDOSCOPY;  Service: Endoscopy;  Laterality: N/A;  . ESOPHAGOGASTRODUODENOSCOPY (EGD) WITH PROPOFOL N/A 06/15/2018   Procedure: ESOPHAGOGASTRODUODENOSCOPY (EGD) WITH PROPOFOL;  Surgeon: Toledo, Boykin Nearing, MD;  Location: [facility] ENDOSCOPY;  Service: Gastroenterology;  Laterality: N/A;  . ESOPHAGOGASTRODUODENOSCOPY (EGD) WITH PROPOFOL N/A 01/03/2019   Procedure: ESOPHAGOGASTRODUODENOSCOPY (EGD) WITH  PROPOFOL;  Surgeon: Midge Minium, MD;  Location: [facility] ENDOSCOPY;  Service: Endoscopy;  Laterality: N/A;  . GIVENS CAPSULE STUDY  04/16/2019   Procedure: GIVENS CAPSULE STUDY;  Surgeon: Pasty Spillers, MD;  Location: [facility] ENDOSCOPY;  Service: Endoscopy;;  . GIVENS CAPSULE STUDY N/A 06/25/2019   Procedure: GIVENS CAPSULE STUDY;  Surgeon: Wyline Mood, MD;  Location: [facility] ENDOSCOPY;  Service: Gastroenterology;  Laterality: N/A;  . LOWER EXTREMITY ANGIOGRAPHY Right 08/21/2018   Procedure: LOWER EXTREMITY ANGIOGRAPHY;  Surgeon: Annice Needy, MD;  Location: [facility] INVASIVE CV LAB;  Service: Cardiovascular;  Laterality: Right;  . LOWER EXTREMITY ANGIOGRAPHY Left 08/28/2018   Procedure: LOWER EXTREMITY ANGIOGRAPHY;  Surgeon: Annice Needy, MD;  Location: [facility] INVASIVE CV LAB;  Service: Cardiovascular;  Laterality: Left;  . LOWER EXTREMITY ANGIOGRAPHY Right 08/28/2018   Procedure: Lower Extremity Angiography;  Surgeon: Annice Needy, MD;  Location: [facility] INVASIVE CV LAB;  Service: Cardiovascular;  Laterality: Right;  . LOWER EXTREMITY ANGIOGRAPHY Right 12/18/2018   Procedure: Lower Extremity Angiography;  Surgeon: Annice Needy, MD;  Location: [facility] INV[facility]IVE CV LAB;  Service: Cardiovascular;  Laterality: Right;  . LOWER EXTREMITY ANGIOGRAPHY Right 12/18/2018   Procedure: Lower Extremity Angiography;  Surgeon: Annice Needy, MD;  Location: [facility] INVASIVE CV LAB;  Service: Cardiovascular;  Laterality: Right;  . LOWER EXTREMITY ANGIOGRAPHY Left 12/21/2018   Procedure: Lower Extremity Angiography;  Surgeon: Annice Needy, MD;  Location: [facility] INVASIVE CV LAB;  Service: Cardiovascular;  Laterality:  Left;  . LOWER EXTREMITY ANGIOGRAPHY Right 12/21/2018   Procedure: Lower Extremity Angiography;  Surgeon: Algernon Huxley, MD;  Location: [place] CV LAB;  Service: Cardiovascular;  Laterality: Right;  . LOWER EXTREMITY INTERVENTION N/A 12/22/2018   Procedure: LOWER EXTREMITY INTERVENTION;  Surgeon: Algernon Huxley, MD;  Location: [place] CV LAB;  Service: Cardiovascular;  Laterality: N/A;    There were no vitals filed for this visit.   Subjective Assessment - 10/01/19 0832    Subjective Pt states she is practicing her HEP.  She feels better today, no pain to report upon arrival.    Pertinent History pt fell 6x before she got her prosthesis; 12/20/18 pt had below knee ampulation due to infection/gangrene    Limitations Sitting;Standing;Walking    How long can you sit comfortably? 1 hour    How long can you stand comfortably? 10 minutes (with prosthesis)    How long can you walk comfortably? A few minutes with some short rest breaks in standing    Patient Stated Goals to be able to do 7-8 steps to get in her home.  She has a ramp she can use now; to be able to walk again without AD.  She wants to be able to stand 1.5 hrs for cooking in her kitchen.    Currently in Pain? Yes    Pain Score 5     Pain Location Knee    Pain Orientation Right           Treatment Today:   Gait training: Amb with FWW on flat surface: 150 ft, 25 ft(later in session),and also 2 laps in parallel bars with L UE support only.  PT cues for symmetrical step length, upright posture  Therapeutic Exercises:  Seated LAQ with prosthesis on: 2x15 Seated marches with prosthesis on: 2x12 Seated hip abd with green band with prosthesis on: 2x15 (5 sec holds)  Side stepping in parallel bars: 2 laps Hamstring curls: 2x15R LE in parallel bars  Pt removed prosthesis for remainder of session  R LE quad set 5 sec x 15 Supine on table iliopsoas/quad stretch (at edge of table), gradually eased into stretch position for LLLD stretch x 3, PT manual overpressure x 1  Pt donned prosthesis to amb out to waiting room at end of session (30 ft)      PT Education - 10/01/19 0924    Education Details pt ed for upright posture during ambulation    Person(s) Educated Patient    Methods Explanation    Comprehension Verbalized understanding;Returned  demonstration            PT Short Term Goals - 09/03/19 1329      PT SHORT TERM GOAL #1   Title Improve R knee extension AROM to 0 degrees to promote optimal gait mechanics with prosthesis    Baseline -10 degrees    Time 3    Period Weeks    Status New      PT SHORT TERM GOAL #2   Title Pt will be able to tolerate 20 min standing interventions without requiring sitting rest break    Baseline 3 min    Time 3    Period Weeks    Status New             PT Long Term Goals - 09/03/19 1332      PT LONG TERM GOAL #1   Title Pt will be able to ascend/descend 8 stairs independently with prosthesis  Baseline unable    Time 6    Period Weeks    Status New      PT LONG TERM GOAL #2   Title Pt will be able to stand x 1 hour wearing prosthesis with intermittent rest break for cooking full meal in her kitchen    Time 6    Period Weeks    Status New      PT LONG TERM GOAL #3   Title Improve FOTO score to >45/52 indicating improved overall activity tolerance    Baseline 31/52    Time 6    Period Weeks    Status New                 Plan - 10/01/19 0930    Clinical Impression Statement Pt tolerated today's tx session well today.  Able to ambulate 150 ft with FWW in clinic today.  R hip flexor tightness contributing to asymmetrical step length during gait cycle.  PT focused on STM and stretch to R iliopsoas and ant LE musculature to address this tightness today.    Personal Factors and Comorbidities Comorbidity 2    Comorbidities see med hx    Examination-Activity Limitations Locomotion Level;Sit;Squat;Stand    Examination-Participation Restrictions Meal Prep;Community Activity;Driving    Stability/Clinical Decision Making Evolving/Moderate complexity    Rehab Potential Good    PT Frequency 2x / week    PT Duration 6 weeks    PT Treatment/Interventions ADLs/Self Care Home Management;Neuromuscular re-education;Balance training;Therapeutic exercise;Therapeutic  activities;Functional mobility training;Stair training;Gait training;Patient/family education;Prosthetic Training;Manual techniques;Energy conservation;Passive range of motion    PT Next Visit Plan cont progressing LE strengthening, gt training, balance    PT Home Exercise Plan kept previous HEP, added sit to stand with emphasis on normal posture through trunk/pelvis/LE in standing 2x5    Consulted and Agree with Plan of Care Patient           Patient will benefit from skilled therapeutic intervention in order to improve the following deficits and impairments:  Abnormal gait, Decreased coordination, Decreased range of motion, Difficulty walking, Prosthetic Dependency, Decreased endurance, Decreased activity tolerance, Decreased knowledge of precautions, Impaired perceived functional ability, Pain, Decreased balance, Impaired flexibility, Postural dysfunction, Decreased strength, Decreased mobility  Visit Diagnosis: Below-knee amputation of right lower extremity (HCC)  Other abnormalities of gait and mobility  Muscle weakness (generalized)  Pain in right lower leg     Problem List Patient Active Problem List   Diagnosis Date Noted  . Cocaine abuse (HCC) 07/26/2019  . Diabetes mellitus without complication (HCC)   . GERD (gastroesophageal reflux disease)   . COPD (chronic obstructive pulmonary disease) (HCC)   . GIB (gastrointestinal bleeding)   . HLD (hyperlipidemia)   . Depression   . Elevated troponin   . Syncope   . Elevated troponin I level   . Anemia of chronic disease 05/16/2019  . Dizziness   . Polyp of colon   . Acute upper GI bleeding 04/13/2019  . Anemia associated with acute blood loss 04/13/2019  . Chronic anticoagulation 04/13/2019  . 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 Symptomatic anemia 04/13/2019  . Upper GI bleed 04/13/2019  . Acute GI bleeding 04/13/2019  . 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 Melena   . DU (duodenal  ulcer)   . DKA (diabetic ketoacidoses) (HCC) 12/14/2018  . Atherosclerosis of artery of extremity with rest pain (HCC) 08/28/2018  . PAD (peripheral artery disease) (HCC) 07/14/2018  .  Abdominal pain 06/13/2018  . HTN (hypertension) 06/13/2018  . Non-insulin dependent type 2 diabetes mellitus (HCC) 06/13/2018  . Elevated lactic acid level 06/13/2018    Ardine Bjork 10/01/2019, 9:32 AM Max Fickle, PT, DPT   [facility] [address] [place], [place], [address] Phone: [phone]   Fax:  [phone]  Name: Gracynn Rajewski MRN: [id number] Date of Birth: 1955-11-21

## 2019-10-03 ENCOUNTER — Ambulatory Visit: Payer: Medicaid Other

## 2019-10-03 ENCOUNTER — Other Ambulatory Visit: Payer: Self-pay

## 2019-10-03 DIAGNOSIS — S88111A Complete traumatic amputation at level between knee and ankle, right lower leg, initial encounter: Secondary | ICD-10-CM

## 2019-10-03 DIAGNOSIS — M6281 Muscle weakness (generalized): Secondary | ICD-10-CM

## 2019-10-03 DIAGNOSIS — R2689 Other abnormalities of gait and mobility: Secondary | ICD-10-CM

## 2019-10-03 DIAGNOSIS — M79661 Pain in right lower leg: Secondary | ICD-10-CM

## 2019-10-03 NOTE — Therapy (Signed)
[facility] [address]. [place], [place], [address] Phone: [phone]   Fax:  [phone]  Physical Therapy Treatment  Patient Details  Name: Sylvia [name] MRN: [id number] Date of Birth: 1955/05/22 No data recorded  Encounter Date: 10/03/2019   PT End of Session - 10/03/19 0937    Visit Number 6    Number of Visits 12    Date for PT Re-Evaluation 10/15/19    Authorization - Visit Number 5    Authorization - Number of Visits 12    Progress Note Due on Visit 4    PT Start Time 0850    PT Stop Time 0935    PT Time Calculation (min) 45 min    Equipment Utilized During Treatment Other (comment)    Activity Tolerance Patient tolerated treatment well;Patient limited by fatigue    Behavior During Therapy [facility] for tasks assessed/performed           Past Medical History:  Diagnosis Date  . Anemia of chronic disease 05/16/2019  . Anxiety    h/o  . COPD (chronic obstructive pulmonary disease) (HCC)   . Diabetes mellitus without complication (HCC)   . GERD (gastroesophageal reflux disease)   . Hypertension    bp under control-off meds since 2019    Past Surgical History:  Procedure Laterality Date  . ABDOMINAL HYSTERECTOMY    . AMPUTATION Right 12/27/2018   Procedure: AMPUTATION BELOW KNEE;  Surgeon: Annice Needy, MD;  Location: [facility];  Service: General;  Laterality: Right;  . CHOLECYSTECTOMY    . COLONOSCOPY WITH PROPOFOL N/A 04/16/2019   Procedure: COLONOSCOPY WITH PROPOFOL;  Surgeon: Pasty Spillers, MD;  Location: [facility] END[facility];  Service: Endoscopy;  Laterality: N/A;  . ESOPHAGOGASTRODUODENOSCOPY (EGD) WITH PROPOFOL N/A 06/15/2018   Procedure: ESOPHAGOGASTRODUODENOSCOPY (EGD) WITH PROPOFOL;  Surgeon: Toledo, Boykin Nearing, MD;  Location: [facility] ENDOS[facility];  Service: Gastroenterology;  Laterality: N/A;  . ESOPHAGOGASTRODUODENOSCOPY (EGD) WITH PROPOFOL N/A 01/03/2019   Procedure: ESOPHAGOGASTRODUODENOSCOPY (EGD) WITH  PROPOFOL;  Surgeon: Midge Minium, MD;  Location: [facility];  Service: Endoscopy;  Laterality: N/A;  . GIVENS CAPSULE STUDY  04/16/2019   Procedure: GIVENS CAPSULE STUDY;  Surgeon: Pasty Spillers, MD;  Location: [facility];  Service: Endoscopy;;  . GIVENS CAPSULE STUDY N/A 06/25/2019   Procedure: GIVENS CAPSULE STUDY;  Surgeon: Wyline Mood, MD;  Location: [facility] ENDOSCOPY;  Service: Gastroenterology;  Laterality: N/A;  . LOWER EXTREMITY ANGIOGRAPHY Right 08/21/2018   Procedure: LOWER EXTREMITY ANGIOGRAPHY;  Surgeon: Annice Needy, MD;  Location: [facility];  Service: Cardiovascular;  Laterality: Right;  . LOWER EXTREMITY ANGIOGRAPHY Left 08/28/2018   Procedure: LOWER EXTREMITY ANGIOGRAPHY;  Surgeon: Annice Needy, MD;  Location: [facility];  Service: Cardiovascular;  Laterality: Left;  . LOWER EXTREMITY ANGIOGRAPHY Right 08/28/2018   Procedure: Lower Extremity Angiography;  Surgeon: Annice Needy, MD;  Location: [facility];  Service: Cardiovascular;  Laterality: Right;  . LOWER EXTREMITY ANGIOGRAPHY Right 12/18/2018   Procedure: Lower Extremity Angiography;  Surgeon: Annice Needy, MD;  Location: [facility];  Service: Cardiovascular;  Laterality: Right;  . LOWER EXTREMITY ANGIOGRAPHY Right 12/18/2018   Procedure: Lower Extremity Angiography;  Surgeon: Annice Needy, MD;  Location: [facility];  Service: Cardiovascular;  Laterality: Right;  . LOWER EXTREMITY ANGIOGRAPHY Left 12/21/2018   Procedure: Lower Extremity Angiography;  Surgeon: Annice Needy, MD;  Location: [facility];  Service: Cardiovascular;  Laterality:  Left;  . LOWER EXTREMITY ANGIOGRAPHY Right 12/21/2018   Procedure: Lower Extremity Angiography;  Surgeon: Annice Needy, MD;  Location: [facility];  Service: Cardiovascular;  Laterality: Right;  . LOWER EXTREMITY INTERVENTION N/A 12/22/2018   Procedure: LOWER EXTREMITY INTERVENTION;  Surgeon: Annice Needy, MD;  Location: [facility]  INV[facility]IVE CV LAB;  Service: Cardiovascular;  Laterality: N/A;    There were no vitals filed for this visit.   Subjective Assessment - 10/03/19 0923    Subjective pt is wearing the thicker sock today.  She noticed her BP was high at home this morning- she ran out of her medication and is getting it refilled.  She has been working on her HEP.    Pertinent History pt fell 6x before she got her prosthesis; 12/20/18 pt had below knee ampulation due to infection/gangrene    Limitations Sitting;Standing;Walking    How long can you sit comfortably? 1 hour    How long can you stand comfortably? 10 minutes (with prosthesis)    How long can you walk comfortably? A few minutes with some short rest breaks in standing    Patient Stated Goals to be able to do 7-8 steps to get in her home.  She has a ramp she can use now; to be able to walk again without AD.  She wants to be able to stand 1.5 hrs for cooking in her kitchen.          Treatment today Amb with FWW x 200 ft- focused on amb a little quicker than self selected speed Sit to stand 2x5 (focused on R LE slightly anterior to L) Amb in parallel bars 4 laps, with L UE support Standing R hip extension in parallel bars 2x15 Step up (6 inch) in parallel bars: LRRL x10, RLLR x5 (with b/l UE support)   Supine (without prosthesis) Supine hip in neutral position STM to R quad/TFL, iliopsoas- 10 min Manual hip flexor stretch- 3x1 min Manual knee ext stretch 3 x 30 sec         PT Education - 10/03/19 0937    Education Details body mechanics, posture during exercises    Person(s) Educated Patient    Methods Explanation;Demonstration    Comprehension Verbalized understanding;Returned demonstration;Verbal cues required            PT Short Term Goals - 09/03/19 1329      PT SHORT TERM GOAL #1   Title Improve R knee extension AROM to 0 degrees to promote optimal gait mechanics with prosthesis    Baseline -10 degrees    Time 3    Period Weeks     Status New      PT SHORT TERM GOAL #2   Title Pt will be able to tolerate 20 min standing interventions without requiring sitting rest break    Baseline 3 min    Time 3    Period Weeks    Status New             PT Long Term Goals - 09/03/19 1332      PT LONG TERM GOAL #1   Title Pt will be able to ascend/descend 8 stairs independently with prosthesis    Baseline unable    Time 6    Period Weeks    Status New      PT LONG TERM GOAL #2   Title Pt will be able to stand x 1 hour wearing prosthesis with intermittent rest break for cooking full  meal in her kitchen    Time 6    Period Weeks    Status New      PT LONG TERM GOAL #3   Title Improve FOTO score to >45/52 indicating improved overall activity tolerance    Baseline 31/52    Time 6    Period Weeks    Status New                 Plan - 10/03/19 6808    Clinical Impression Statement Pt's trunk posture improved during ambulation, she still lacks normal R hip extension during terminal stance/push off.  PT continued to address anterior hip/quad mm tightness with manual tx today and focused on R glute max activation in standing today too to promote improved gait.    Personal Factors and Comorbidities Comorbidity 2    Comorbidities see med hx    Examination-Activity Limitations Locomotion Level;Sit;Squat;Stand    Examination-Participation Restrictions Meal Prep;Community Activity;Driving    Stability/Clinical Decision Making Evolving/Moderate complexity    Rehab Potential Good    PT Frequency 2x / week    PT Duration 6 weeks    PT Treatment/Interventions ADLs/Self Care Home Management;Neuromuscular re-education;Balance training;Therapeutic exercise;Therapeutic activities;Functional mobility training;Stair training;Gait training;Patient/family education;Prosthetic Training;Manual techniques;Energy conservation;Passive range of motion    PT Next Visit Plan cont progressing LE strengthening, gt training, balance     PT Home Exercise Plan kept previous HEP, added sit to stand with emphasis on normal posture through trunk/pelvis/LE in standing 2x5    Consulted and Agree with Plan of Care Patient           Patient will benefit from skilled therapeutic intervention in order to improve the following deficits and impairments:  Abnormal gait, Decreased coordination, Decreased range of motion, Difficulty walking, Prosthetic Dependency, Decreased endurance, Decreased activity tolerance, Decreased knowledge of precautions, Impaired perceived functional ability, Pain, Decreased balance, Impaired flexibility, Postural dysfunction, Decreased strength, Decreased mobility  Visit Diagnosis: Below-knee amputation of right lower extremity (HCC)  Other abnormalities of gait and mobility  Muscle weakness (generalized)  Pain in right lower leg     Problem List Patient Active Problem List   Diagnosis Date Noted  . Cocaine abuse (HCC) 07/26/2019  . Diabetes mellitus without complication (HCC)   . GERD (gastroesophageal reflux disease)   . COPD (chronic obstructive pulmonary disease) (HCC)   . GIB (gastrointestinal bleeding)   . HLD (hyperlipidemia)   . Depression   . Elevated troponin   . Syncope   . Elevated troponin I level   . Anemia of chronic disease 05/16/2019  . Dizziness   . Polyp of colon   . Acute upper GI bleeding 04/13/2019  . Anemia associated with acute blood loss 04/13/2019  . Chronic anticoagulation 04/13/2019  . 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 Symptomatic anemia 04/13/2019  . Upper GI bleed 04/13/2019  . Acute GI bleeding 04/13/2019  . 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 Melena   . DU (duodenal ulcer)   . DKA (diabetic ketoacidoses) (HCC) 12/14/2018  . Atherosclerosis of artery of extremity with rest pain (HCC) 08/28/2018  . PAD (peripheral artery disease) (HCC) 07/14/2018  . Abdominal pain 06/13/2018  . HTN (hypertension) 06/13/2018  .  Non-insulin dependent type 2 diabetes mellitus (HCC) 06/13/2018  . Elevated lactic acid level 06/13/2018    Ardine Bjork 10/03/2019, 9:44 AM  [facility] [address] [place], [place], [address] Phone: [phone]  Fax:  [phone]  Name: Sylvia [name] MRN: [id number] Date of Birth: 1956-02-21

## 2019-10-08 ENCOUNTER — Other Ambulatory Visit: Payer: Self-pay

## 2019-10-08 ENCOUNTER — Ambulatory Visit: Payer: Medicaid Other

## 2019-10-08 DIAGNOSIS — S88111A Complete traumatic amputation at level between knee and ankle, right lower leg, initial encounter: Secondary | ICD-10-CM

## 2019-10-08 DIAGNOSIS — R2689 Other abnormalities of gait and mobility: Secondary | ICD-10-CM

## 2019-10-08 DIAGNOSIS — M79661 Pain in right lower leg: Secondary | ICD-10-CM

## 2019-10-08 DIAGNOSIS — M6281 Muscle weakness (generalized): Secondary | ICD-10-CM

## 2019-10-08 NOTE — Therapy (Signed)
[facility] [address] [place], [place], [address] Phone: [phone]   Fax:  [phone]  Physical Therapy Treatment/Progress Note  Patient Details  Name: Sylvia [name] MRN: [id number] Date of Birth: 04/26/1955 No data recorded  Encounter Date: 10/08/2019   PT End of Session - 10/08/19 0853    Visit Number 7    Number of Visits 12    Date for PT Re-Evaluation 10/15/19    Authorization - Visit Number 6    Authorization - Number of Visits 12    PT Start Time 0825    PT Stop Time 0915    PT Time Calculation (min) 50 min    Equipment Utilized During Treatment Other (comment)    Activity Tolerance Patient tolerated treatment well;Patient limited by fatigue    Behavior During Therapy [facility] for tasks assessed/performed           Past Medical History:  Diagnosis Date  . Anemia of chronic disease 05/16/2019  . Anxiety    h/o  . COPD (chronic obstructive pulmonary disease) ([place])   . Diabetes mellitus without complication ([place])   . GERD (gastroesophageal reflux disease)   . Hypertension    bp under control-off meds since 2019    Past Surgical History:  Procedure Laterality Date  . ABDOMINAL HYSTERECTOMY    . AMPUTATION Right 12/27/2018   Procedure: AMPUTATION BELOW KNEE;  Surgeon: Algernon Huxley, MD;  Location: [facility];  Service: General;  Laterality: Right;  . CHOLECYSTECTOMY    . COLONOSCOPY WITH PROPOFOL N/A 04/16/2019   Procedure: COLONOSCOPY WITH PROPOFOL;  Surgeon: Virgel Manifold, MD;  Location: [facility];  Service: Endoscopy;  Laterality: N/A;  . ESOPHAGOGASTRODUODENOSCOPY (EGD) WITH PROPOFOL N/A 06/15/2018   Procedure: ESOPHAGOGASTRODUODENOSCOPY (EGD) WITH PROPOFOL;  Surgeon: Toledo, Benay Pike, MD;  Location: [facility];  Service: Gastroenterology;  Laterality: N/A;  . ESOPHAGOGASTRODUODENOSCOPY (EGD) WITH PROPOFOL N/A 01/03/2019   Procedure: ESOPHAGOGASTRODUODENOSCOPY (EGD) WITH PROPOFOL;  Surgeon: Lucilla Lame, MD;  Location: [facility];  Service: Endoscopy;  Laterality: N/A;  . GIVENS CAPSULE STUDY  04/16/2019   Procedure: GIVENS CAPSULE STUDY;  Surgeon: Virgel Manifold, MD;  Location: [facility];  Service: Endoscopy;;  . GIVENS CAPSULE STUDY N/A 06/25/2019   Procedure: GIVENS CAPSULE STUDY;  Surgeon: Jonathon Bellows, MD;  Location: [facility] ENDOSCOPY;  Service: Gastroenterology;  Laterality: N/A;  . LOWER EXTREMITY ANGIOGRAPHY Right 08/21/2018   Procedure: LOWER EXTREMITY ANGIOGRAPHY;  Surgeon: Algernon Huxley, MD;  Location: [place] CV LAB;  Service: Cardiovascular;  Laterality: Right;  . LOWER EXTREMITY ANGIOGRAPHY Left 08/28/2018   Procedure: LOWER EXTREMITY ANGIOGRAPHY;  Surgeon: Algernon Huxley, MD;  Location: [place] CV LAB;  Service: Cardiovascular;  Laterality: Left;  . LOWER EXTREMITY ANGIOGRAPHY Right 08/28/2018   Procedure: Lower Extremity Angiography;  Surgeon: Algernon Huxley, MD;  Location: [place] CV LAB;  Service: Cardiovascular;  Laterality: Right;  . LOWER EXTREMITY ANGIOGRAPHY Right 12/18/2018   Procedure: Lower Extremity Angiography;  Surgeon: Algernon Huxley, MD;  Location: [place] CV LAB;  Service: Cardiovascular;  Laterality: Right;  . LOWER EXTREMITY ANGIOGRAPHY Right 12/18/2018   Procedure: Lower Extremity Angiography;  Surgeon: Algernon Huxley, MD;  Location: [place] CV LAB;  Service: Cardiovascular;  Laterality: Right;  . LOWER EXTREMITY ANGIOGRAPHY Left 12/21/2018   Procedure: Lower Extremity Angiography;  Surgeon: Algernon Huxley, MD;  Location: [place] [facility];  Service: Cardiovascular;  Laterality: Left;  . LOWER EXTREMITY ANGIOGRAPHY Right 12/21/2018  Procedure: Lower Extremity Angiography;  Surgeon: Algernon Huxley, MD;  Location: [place] CV LAB;  Service: Cardiovascular;  Laterality: Right;  . LOWER EXTREMITY INTERVENTION N/A 12/22/2018   Procedure: LOWER EXTREMITY INTERVENTION;  Surgeon: Algernon Huxley, MD;  Location: [place] CV LAB;  Service:  Cardiovascular;  Laterality: N/A;    There were no vitals filed for this visit.   Subjective Assessment - 10/08/19 0816    Subjective Pt states she had her prosthesis on while she did some grilling.  She is wearing her prosthesis for up to 2 hours, this felt like "a little too much" because this morning she is sore/tender along distal limb.    Pertinent History pt fell 6x before she got her prosthesis; 12/20/18 pt had below knee ampulation due to infection/gangrene    Limitations Sitting;Standing;Walking    How long can you sit comfortably? 1 hour    How long can you stand comfortably? 10 minutes (with prosthesis)    How long can you walk comfortably? A few minutes with some short rest breaks in standing    Patient Stated Goals to be able to do 7-8 steps to get in her home.  She has a ramp she can use now; to be able to walk again without AD.  She wants to be able to stand 1.5 hrs for cooking in her kitchen.    Currently in Pain? Yes    Pain Score 7     Pain Location Knee    Pain Orientation Right              Treatment Today:  Objective Measures:  -5x sit to stand completed in 25 seconds today from standard height chair (no UE support used) -Knee ROM: -2 degrees extension AAROM -Gait: pt able to amb 230 ft with FWW/parallel bars in clinic today before taking sitting rest break; pt achieving improved stride length bilaterally, still lacking normal R hip extension during terminal stance/push off    Manual Therapy: -15 min STM/TPR distal hamstrings, anterior proximal thigh/TFL, Manual knee extension stretch 5x30 sec  Therapeutic Exercise: Sit stand 5x, 2 rounds Quad set 5 sec x10, 2 sets Prone glute sets 5 sec x15 Prone hip extension 2x10, resting break in prone to promote lengthening of anterior thigh/hip soft tissue/mm Amb in parallel bars 4 laps focusing on turning to the R, PT cues for picking up R foot to turn instead of pivoting on R LE         PT Education -  10/08/19 0853    Education Details prone position to stretch anterior R hip mm and hamstrings at posterior knee    Person(s) Educated Patient    Methods Explanation;Demonstration    Comprehension Verbalized understanding;Returned demonstration;Verbal cues required;Need further instruction            PT Short Term Goals - 10/08/19 0935      PT SHORT TERM GOAL #1   Title Improve R knee extension AROM to 0 degrees to promote optimal gait mechanics with prosthesis    Baseline -10 degrees, 6/21: -2 degrees    Time 3    Period Weeks    Status Partially Met    Target Date 10/29/19      PT SHORT TERM GOAL #2   Title Pt will be able to tolerate 20 min standing interventions without requiring sitting rest break    Baseline 3 min, 6/21: able to perform with her FWW or parallel bars    Time 3  Period Weeks    Status Partially Met             PT Long Term Goals - 10/08/19 0931      PT LONG TERM GOAL #1   Title Pt will be able to ascend/descend 8 stairs independently with prosthesis    Baseline unable, 6/21: pt able to ascend 1 8-inch step in parallel bars with bilateral railing using L LE to ascend and R to descend    Time 6    Period Weeks    Status New    Target Date 11/05/19      PT LONG TERM GOAL #2   Title Pt will be able to stand x 1 hour wearing prosthesis with intermittent rest break for cooking full meal in her kitchen    Baseline 6/21: pt standing to cook but requires a chair to sit and cook from after 10-15 min.    Time 6    Period Weeks    Status New    Target Date 11/05/19      PT LONG TERM GOAL #3   Title Improve FOTO score to >45/52 indicating improved overall activity tolerance    Baseline 31/52, 6/21: attempted to do FOTO but website not accessible- will attempt again at next visit    Time 6    Period Weeks    Status New      PT LONG TERM GOAL #4   Title Pt will return to driving short distances (20-30 minutes) for independent community mobility for  grocery shopping and doctor's appointments    Baseline 6/21: not driving    Time 8    Period Weeks    Status New    Target Date 12/03/19                  Patient will benefit from skilled therapeutic intervention in order to improve the following deficits and impairments:     Visit Diagnosis: Below-knee amputation of right lower extremity (HCC)  Other abnormalities of gait and mobility  Muscle weakness (generalized)  Pain in right lower leg     Problem List Patient Active Problem List   Diagnosis Date Noted  . Cocaine abuse ([place]) 07/26/2019  . Diabetes mellitus without complication ([place])   . GERD (gastroesophageal reflux disease)   . COPD (chronic obstructive pulmonary disease) ([place])   . GIB (gastrointestinal bleeding)   . HLD (hyperlipidemia)   . Depression   . Elevated troponin   . Syncope   . Elevated troponin I level   . Anemia of chronic disease 05/16/2019  . Dizziness   . Polyp of colon   . Acute upper GI bleeding 04/13/2019  . Anemia associated with acute blood loss 04/13/2019  . Chronic anticoagulation 04/13/2019  . Hx of right BKA ([place]) 04/13/2019  . Symptomatic anemia 04/13/2019  . Upper GI bleed 04/13/2019  . Acute GI bleeding 04/13/2019  . Phantom pain after amputation of lower extremity ([place]) 01/30/2019  . Melena   . DU (duodenal ulcer)   . DKA (diabetic ketoacidoses) ([place]) 12/14/2018  . Atherosclerosis of artery of extremity with rest pain ([place]) 08/28/2018  . PAD (peripheral artery disease) ([place]) 07/14/2018  . Abdominal pain 06/13/2018  . HTN (hypertension) 06/13/2018  . Non-insulin dependent type 2 diabetes mellitus ([place]) 06/13/2018  . Elevated lactic acid level 06/13/2018    Pincus Badder 10/08/2019, 9:38 AM Merdis Delay, PT, DPT   [facility] [address]  [place], [place], [address] Phone: [phone]   Fax:  [phone]  Name: Sylvia [name] MRN: [id number] Date of  Birth: [birth year]

## 2019-10-10 ENCOUNTER — Other Ambulatory Visit: Payer: Self-pay

## 2019-10-10 ENCOUNTER — Ambulatory Visit: Payer: Medicaid Other

## 2019-10-10 DIAGNOSIS — S88111A Complete traumatic amputation at level between knee and ankle, right lower leg, initial encounter: Secondary | ICD-10-CM

## 2019-10-10 DIAGNOSIS — M6281 Muscle weakness (generalized): Secondary | ICD-10-CM

## 2019-10-10 DIAGNOSIS — R2689 Other abnormalities of gait and mobility: Secondary | ICD-10-CM

## 2019-10-10 NOTE — Therapy (Signed)
[facility] [address] [place], [place], [address] Phone: [phone]   Fax:  [phone]  Physical Therapy Treatment  Patient Details  Name: Sylvia [name] MRN: [id number] Date of Birth: November 30, 1955 No data recorded  Encounter Date: 10/10/2019   PT End of Session - 10/10/19 1137    Visit Number 8    Number of Visits 12    Date for PT Re-Evaluation 10/15/19    Authorization - Visit Number 7    Authorization - Number of Visits 12    PT Start Time 0850    PT Stop Time 0935    PT Time Calculation (min) 45 min    Equipment Utilized During Treatment Other (comment)    Activity Tolerance Patient tolerated treatment well;Patient limited by fatigue    Behavior During Therapy [facility] for tasks assessed/performed           Past Medical History:  Diagnosis Date  . Anemia of chronic disease 05/16/2019  . Anxiety    h/o  . COPD (chronic obstructive pulmonary disease) ([place])   . Diabetes mellitus without complication ([place])   . GERD (gastroesophageal reflux disease)   . Hypertension    bp under control-off meds since 2019    Past Surgical History:  Procedure Laterality Date  . ABDOMINAL HYSTERECTOMY    . AMPUTATION Right 12/27/2018   Procedure: AMPUTATION BELOW KNEE;  Surgeon: Algernon Huxley, MD;  Location: [facility] ORS;  Service: General;  Laterality: Right;  . CHOLECYSTECTOMY    . COLONOSCOPY WITH PROPOFOL N/A 04/16/2019   Procedure: COLONOSCOPY WITH PROPOFOL;  Surgeon: Virgel Manifold, MD;  Location: [facility] ENDOSCOPY;  Service: Endoscopy;  Laterality: N/A;  . ESOPHAGOGASTRODUODENOSCOPY (EGD) WITH PROPOFOL N/A 06/15/2018   Procedure: ESOPHAGOGASTRODUODENOSCOPY (EGD) WITH PROPOFOL;  Surgeon: Toledo, Benay Pike, MD;  Location: [facility] ENDOSCOPY;  Service: Gastroenterology;  Laterality: N/A;  . ESOPHAGOGASTRODUODENOSCOPY (EGD) WITH PROPOFOL N/A 01/03/2019   Procedure: ESOPHAGOGASTRODUODENOSCOPY (EGD) WITH PROPOFOL;  Surgeon: Lucilla Lame, MD;   Location: [facility] ENDOSCOPY;  Service: Endoscopy;  Laterality: N/A;  . GIVENS CAPSULE STUDY  04/16/2019   Procedure: GIVENS CAPSULE STUDY;  Surgeon: Virgel Manifold, MD;  Location: [facility] ENDOSCOPY;  Service: Endoscopy;;  . GIVENS CAPSULE STUDY N/A 06/25/2019   Procedure: GIVENS CAPSULE STUDY;  Surgeon: Jonathon Bellows, MD;  Location: [facility] ENDOSCOPY;  Service: Gastroenterology;  Laterality: N/A;  . LOWER EXTREMITY ANGIOGRAPHY Right 08/21/2018   Procedure: LOWER EXTREMITY ANGIOGRAPHY;  Surgeon: Algernon Huxley, MD;  Location: [place] CV LAB;  Service: Cardiovascular;  Laterality: Right;  . LOWER EXTREMITY ANGIOGRAPHY Left 08/28/2018   Procedure: LOWER EXTREMITY ANGIOGRAPHY;  Surgeon: Algernon Huxley, MD;  Location: [place] CV LAB;  Service: Cardiovascular;  Laterality: Left;  . LOWER EXTREMITY ANGIOGRAPHY Right 08/28/2018   Procedure: Lower Extremity Angiography;  Surgeon: Algernon Huxley, MD;  Location: [place] CV LAB;  Service: Cardiovascular;  Laterality: Right;  . LOWER EXTREMITY ANGIOGRAPHY Right 12/18/2018   Procedure: Lower Extremity Angiography;  Surgeon: Algernon Huxley, MD;  Location: [place] CV LAB;  Service: Cardiovascular;  Laterality: Right;  . LOWER EXTREMITY ANGIOGRAPHY Right 12/18/2018   Procedure: Lower Extremity Angiography;  Surgeon: Algernon Huxley, MD;  Location: [place] CV LAB;  Service: Cardiovascular;  Laterality: Right;  . LOWER EXTREMITY ANGIOGRAPHY Left 12/21/2018   Procedure: Lower Extremity Angiography;  Surgeon: Algernon Huxley, MD;  Location: [place] CV LAB;  Service: Cardiovascular;  Laterality: Left;  . LOWER EXTREMITY ANGIOGRAPHY Right 12/21/2018  Procedure: Lower Extremity Angiography;  Surgeon: Algernon Huxley, MD;  Location: [place] CV LAB;  Service: Cardiovascular;  Laterality: Right;  . LOWER EXTREMITY INTERVENTION N/A 12/22/2018   Procedure: LOWER EXTREMITY INTERVENTION;  Surgeon: Algernon Huxley, MD;  Location: [place] [facility] LAB;  Service:  Cardiovascular;  Laterality: N/A;    There were no vitals filed for this visit.   Subjective Assessment - 10/10/19 0914    Subjective Pt states she has been practicing turning to the right to change direction during ambulation like she practiced in PT last time.  She reports she stood for 30 min to cook/meal prep while wearing her prosthesis.  She reports she practiced lying on her stomach to stretch the back of her knee straight 1x too.    Pertinent History pt fell 6x before she got her prosthesis; 12/20/18 pt had below knee ampulation due to infection/gangrene    Limitations Sitting;Standing;Walking    How long can you sit comfortably? 1 hour    How long can you stand comfortably? 10 minutes (with prosthesis)    How long can you walk comfortably? A few minutes with some short rest breaks in standing    Patient Stated Goals to be able to do 7-8 steps to get in her home.  She has a ramp she can use now; to be able to walk again without AD.  She wants to be able to stand 1.5 hrs for cooking in her kitchen.    Currently in Pain? No/denies           Treatment Today: Pt able to amb 200 ft with FWW today before taking sitting rest break; pt achieving improved stride length bilaterally, still lacking normal R hip extension during terminal stance/push off, upright trunk posture observed and pt not leaning heavily on walker    Parallel bars: practiced walking backwards x 2 laps, side steps x 2 laps, forward with single L UE x 1 lap  Sit stand 5x, 2 rounds Seated knee extension: 2x10 with prosthesis on Obstacle course: stepping over yoga blocks/amb around cones x 4 min, used her FWW  Pt removed prosthesis for remainder of tx Prone hip extension 2x10, resting break in prone to promote lengthening of anterior thigh/hip soft tissue/mm  Manual Therapy: min manual R hip flexor stretch with knee off table supine x 3 min, manual knee extension stretch 2x30 sec  Pt donned prosthesis at end of session  and amb out of clinic x 50 ft with FWW            PT Education - 10/10/19 1136    Education Details purpose of exercises, exercise technique instruction during session    Person(s) Educated Patient    Methods Explanation;Demonstration;Tactile cues;Verbal cues    Comprehension Verbalized understanding;Returned demonstration;Verbal cues required;Tactile cues required            PT Short Term Goals - 10/08/19 0935      PT SHORT TERM GOAL #1   Title Improve R knee extension AROM to 0 degrees to promote optimal gait mechanics with prosthesis    Baseline -10 degrees, 6/21: -2 degrees    Time 3    Period Weeks    Status Partially Met    Target Date 10/29/19      PT SHORT TERM GOAL #2   Title Pt will be able to tolerate 20 min standing interventions without requiring sitting rest break    Baseline 3 min, 6/21: able to perform with her FWW  or parallel bars    Time 3    Period Weeks    Status Partially Met             PT Long Term Goals - 10/08/19 0931      PT LONG TERM GOAL #1   Title Pt will be able to ascend/descend 8 stairs independently with prosthesis    Baseline unable, 6/21: pt able to ascend 1 8-inch step in parallel bars with bilateral railing using L LE to ascend and R to descend    Time 6    Period Weeks    Status New    Target Date 11/05/19      PT LONG TERM GOAL #2   Title Pt will be able to stand x 1 hour wearing prosthesis with intermittent rest break for cooking full meal in her kitchen    Baseline 6/21: pt standing to cook but requires a chair to sit and cook from after 10-15 min.    Time 6    Period Weeks    Status New    Target Date 11/05/19      PT LONG TERM GOAL #3   Title Improve FOTO score to >45/52 indicating improved overall activity tolerance    Baseline 31/52, 6/21: attempted to do FOTO but website not accessible- will attempt again at next visit    Time 6    Period Weeks    Status New      PT LONG TERM GOAL #4   Title Pt will  return to driving short distances (20-30 minutes) for independent community mobility for grocery shopping and doctor's appointments    Baseline 6/21: not driving    Time 8    Period Weeks    Status New    Target Date 12/03/19                 Plan - 10/10/19 1137    Clinical Impression Statement Muscle tightness along anterior hip/posterior knee is improving- pt able to lie supine with R knee flexed off table and her femur resting on table today.  Pt demonstrates improved trunk, pelvis, and lower extremity control while ambulating today- PT observed pt trunk posture remains upright, she was not leaning heavily on her FWW, and step length nearly symmetrical.  She would benefit from instruction/practice with stair navigation as this is not something addressed yet in PT.    Personal Factors and Comorbidities Comorbidity 2    Comorbidities see med hx    Examination-Activity Limitations Locomotion Level;Sit;Squat;Stand    Examination-Participation Restrictions Meal Prep;Community Activity;Driving    Stability/Clinical Decision Making Evolving/Moderate complexity    Rehab Potential Good    PT Frequency 2x / week    PT Duration 6 weeks    PT Treatment/Interventions ADLs/Self Care Home Management;Neuromuscular re-education;Balance training;Therapeutic exercise;Therapeutic activities;Functional mobility training;Stair training;Gait training;Patient/family education;Prosthetic Training;Manual techniques;Energy conservation;Passive range of motion    PT Next Visit Plan cont progressing LE strengthening, gt training, balance    PT Home Exercise Plan kept previous HEP, added sit to stand with emphasis on normal posture through trunk/pelvis/LE in standing 2x5    Consulted and Agree with Plan of Care Patient           Patient will benefit from skilled therapeutic intervention in order to improve the following deficits and impairments:  Abnormal gait, Decreased coordination, Decreased range of  motion, Difficulty walking, Prosthetic Dependency, Decreased endurance, Decreased activity tolerance, Decreased knowledge of precautions, Impaired perceived functional ability, Pain, Decreased balance, Impaired  flexibility, Postural dysfunction, Decreased strength, Decreased mobility  Visit Diagnosis: Below-knee amputation of right lower extremity (HCC)  Other abnormalities of gait and mobility  Muscle weakness (generalized)     Problem List Patient Active Problem List   Diagnosis Date Noted  . Cocaine abuse ([place]) 07/26/2019  . Diabetes mellitus without complication ([place])   . GERD (gastroesophageal reflux disease)   . COPD (chronic obstructive pulmonary disease) ([place])   . GIB (gastrointestinal bleeding)   . HLD (hyperlipidemia)   . Depression   . Elevated troponin   . Syncope   . Elevated troponin I level   . Anemia of chronic disease 05/16/2019  . Dizziness   . Polyp of colon   . Acute upper GI bleeding 04/13/2019  . Anemia associated with acute blood loss 04/13/2019  . Chronic anticoagulation 04/13/2019  . Hx of right BKA ([place]) 04/13/2019  . Symptomatic anemia 04/13/2019  . Upper GI bleed 04/13/2019  . Acute GI bleeding 04/13/2019  . 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 Melena   . DU (duodenal ulcer)   . DKA (diabetic ketoacidoses) ([place]) 12/14/2018  . Atherosclerosis of artery of extremity with rest pain ([place]) 08/28/2018  . PAD (peripheral artery disease) ([place]) 07/14/2018  . Abdominal pain 06/13/2018  . HTN (hypertension) 06/13/2018  . Non-insulin dependent type 2 diabetes mellitus ([place]) 06/13/2018  . Elevated lactic acid level 06/13/2018    Pincus Badder 10/10/2019, 11:41 AM Merdis Delay, PT, DPT   [facility] [address] [place], [place], [address] Phone: [phone]   Fax:  [phone]  Name: Sylvia [name] MRN: [id number] Date of Birth: 08-30-1955

## 2019-10-17 ENCOUNTER — Ambulatory Visit: Payer: Medicaid Other | Admitting: Physical Therapy

## 2019-10-19 ENCOUNTER — Other Ambulatory Visit: Payer: Self-pay

## 2019-10-19 ENCOUNTER — Ambulatory Visit: Payer: Medicaid Other | Admitting: Physical Therapy

## 2019-10-23 ENCOUNTER — Ambulatory Visit: Payer: Medicaid Other | Attending: Vascular Surgery | Admitting: Physical Therapy

## 2019-10-23 ENCOUNTER — Other Ambulatory Visit: Payer: Self-pay

## 2019-10-23 ENCOUNTER — Encounter: Payer: Self-pay | Admitting: Physical Therapy

## 2019-10-23 DIAGNOSIS — M6281 Muscle weakness (generalized): Secondary | ICD-10-CM | POA: Insufficient documentation

## 2019-10-23 DIAGNOSIS — S88111A Complete traumatic amputation at level between knee and ankle, right lower leg, initial encounter: Secondary | ICD-10-CM | POA: Diagnosis not present

## 2019-10-23 DIAGNOSIS — R2689 Other abnormalities of gait and mobility: Secondary | ICD-10-CM | POA: Diagnosis present

## 2019-10-23 DIAGNOSIS — M79661 Pain in right lower leg: Secondary | ICD-10-CM | POA: Diagnosis present

## 2019-10-23 NOTE — Therapy (Signed)
[facility] [address] [place], [place], [address] Phone: [phone]   Fax:  [phone]  Physical Therapy Treatment  Patient Details  Name: Sylvia [name] MRN: [id number] Date of Birth: 08-Oct-1955  Encounter Date: 10/23/2019   PT End of Session - 10/23/19 1355    Visit Number 9    Number of Visits 17    Date for PT Re-Evaluation 11/20/19    Authorization - Visit Number 1    Authorization - Number of Visits 10    PT Start Time 8110    PT Stop Time 3159    PT Time Calculation (min) 49 min    Equipment Utilized During Treatment Other (comment)    Activity Tolerance Patient tolerated treatment well;Patient limited by fatigue    Behavior During Therapy [facility] for tasks assessed/performed           Past Medical History:  Diagnosis Date  . Anemia of chronic disease 05/16/2019  . Anxiety    h/o  . COPD (chronic obstructive pulmonary disease) ([place])   . Diabetes mellitus without complication ([place])   . GERD (gastroesophageal reflux disease)   . Hypertension    bp under control-off meds since 2019    Past Surgical History:  Procedure Laterality Date  . ABDOMINAL HYSTERECTOMY    . AMPUTATION Right 12/27/2018   Procedure: AMPUTATION BELOW KNEE;  Surgeon: Algernon Huxley, MD;  Location: [facility] ORS;  Service: General;  Laterality: Right;  . CHOLECYSTECTOMY    . COLONOSCOPY WITH PROPOFOL N/A 04/16/2019   Procedure: COLONOSCOPY WITH PROPOFOL;  Surgeon: Virgel Manifold, MD;  Location: [facility] ENDOSCOPY;  Service: Endoscopy;  Laterality: N/A;  . ESOPHAGOGASTRODUODENOSCOPY (EGD) WITH PROPOFOL N/A 06/15/2018   Procedure: ESOPHAGOGASTRODUODENOSCOPY (EGD) WITH PROPOFOL;  Surgeon: Toledo, Benay Pike, MD;  Location: [facility] ENDOSCOPY;  Service: Gastroenterology;  Laterality: N/A;  . ESOPHAGOGASTRODUODENOSCOPY (EGD) WITH PROPOFOL N/A 01/03/2019   Procedure: ESOPHAGOGASTRODUODENOSCOPY (EGD) WITH PROPOFOL;  Surgeon: Lucilla Lame, MD;  Location: [facility]  ENDOSCOPY;  Service: Endoscopy;  Laterality: N/A;  . GIVENS CAPSULE STUDY  04/16/2019   Procedure: GIVENS CAPSULE STUDY;  Surgeon: Virgel Manifold, MD;  Location: [facility] ENDOSCOPY;  Service: Endoscopy;;  . GIVENS CAPSULE STUDY N/A 06/25/2019   Procedure: GIVENS CAPSULE STUDY;  Surgeon: Jonathon Bellows, MD;  Location: [facility] ENDOSCOPY;  Service: Gastroenterology;  Laterality: N/A;  . LOWER EXTREMITY ANGIOGRAPHY Right 08/21/2018   Procedure: LOWER EXTREMITY ANGIOGRAPHY;  Surgeon: Algernon Huxley, MD;  Location: [place] CV LAB;  Service: Cardiovascular;  Laterality: Right;  . LOWER EXTREMITY ANGIOGRAPHY Left 08/28/2018   Procedure: LOWER EXTREMITY ANGIOGRAPHY;  Surgeon: Algernon Huxley, MD;  Location: [place] CV LAB;  Service: Cardiovascular;  Laterality: Left;  . LOWER EXTREMITY ANGIOGRAPHY Right 08/28/2018   Procedure: Lower Extremity Angiography;  Surgeon: Algernon Huxley, MD;  Location: [place] CV LAB;  Service: Cardiovascular;  Laterality: Right;  . LOWER EXTREMITY ANGIOGRAPHY Right 12/18/2018   Procedure: Lower Extremity Angiography;  Surgeon: Algernon Huxley, MD;  Location: [place] CV LAB;  Service: Cardiovascular;  Laterality: Right;  . LOWER EXTREMITY ANGIOGRAPHY Right 12/18/2018   Procedure: Lower Extremity Angiography;  Surgeon: Algernon Huxley, MD;  Location: [place] CV LAB;  Service: Cardiovascular;  Laterality: Right;  . LOWER EXTREMITY ANGIOGRAPHY Left 12/21/2018   Procedure: Lower Extremity Angiography;  Surgeon: Algernon Huxley, MD;  Location: [place] CV LAB;  Service: Cardiovascular;  Laterality: Left;  . LOWER EXTREMITY ANGIOGRAPHY Right 12/21/2018   Procedure: Lower  Extremity Angiography;  Surgeon: Algernon Huxley, MD;  Location: [place] CV LAB;  Service: Cardiovascular;  Laterality: Right;  . LOWER EXTREMITY INTERVENTION N/A 12/22/2018   Procedure: LOWER EXTREMITY INTERVENTION;  Surgeon: Algernon Huxley, MD;  Location: [place] CV LAB;  Service: Cardiovascular;   Laterality: N/A;    There were no vitals filed for this visit.   Subjective Assessment - 10/23/19 1353    Subjective Pt. reports that she was able to stand in the kitchen for a whole hour over the weekend.    Pertinent History pt fell 6x before she got her prosthesis; 12/20/18 pt had below knee ampulation due to infection/gangrene    Limitations Sitting;Standing;Walking    How long can you sit comfortably? 1 hour    How long can you stand comfortably? 10 minutes (with prosthesis)    How long can you walk comfortably? A few minutes with some short rest breaks in standing    Patient Stated Goals to be able to do 7-8 steps to get in her home.  She has a ramp she can use now; to be able to walk again without AD.  She wants to be able to stand 1.5 hrs for cooking in her kitchen.    Currently in Pain? No/denies             Gait training:  Walking in hallway with walker: cueing to take even steps and decrease UE support on walker.  Working on consistent step length/ heel strikes with cuing and mirror feedback   Neuro re-ed:  Stairs: pt. more comfortable with step to pattern at this time, able to complete reciprocal pattern with increased residual limb discomfort.   // bars:  Walking in // bars: working on even step length, decreasing hand support   Cone kicks: 1x  Single leg stance: 5x, easier on L than R  Ball kicks with L leg: cueing to decrease hand support   Manual: (<5 min.).    STM to R quad and hamstrings in sitting (reassessing quad lag).         PT Short Term Goals - 10/24/19 7628      PT SHORT TERM GOAL #1   Title Improve R knee extension AROM to 0 degrees to promote optimal gait mechanics with prosthesis    Baseline -10 degrees, 6/21: -2 degrees.   7/6: R quad lag -8 deg.    Time 4    Period Weeks    Status Partially Met    Target Date 11/20/19      PT SHORT TERM GOAL #2   Title Pt will be able to tolerate 20 min standing interventions without requiring  sitting rest break    Baseline 3 min, 6/21: able to perform with her FWW or parallel bars    Time 4    Period Weeks    Status Achieved    Target Date 10/23/19             PT Long Term Goals - 10/23/19 1714      PT LONG TERM GOAL #1   Title Pt will be able to ascend/descend 8 stairs independently with prosthesis    Baseline unable, 6/21: pt able to ascend 1 8-inch step in parallel bars with bilateral railing using L LE to ascend and R to descend; 10/23/19: able to ascend/descend 4 steps with reciprocal pattern, limited by residual limb pain/pressure    Time 4    Period Weeks    Status Partially Met  Target Date 11/20/19      PT LONG TERM GOAL #2   Title Pt will be able to stand x 1 hour wearing prosthesis with intermittent rest break for cooking full meal in her kitchen    Baseline 6/21: pt standing to cook but requires a chair to sit and cook from after 10-15 min.    Time 6    Period Weeks    Status Achieved    Target Date 10/23/19      PT LONG TERM GOAL #3   Title Improve FOTO score to >45/52 indicating improved overall activity tolerance    Baseline 31/52, 6/21: attempted to do FOTO but website not accessible- will attempt again at next visit; 10/23/2019: 33/52    Time 6    Period Weeks    Status Not Met    Target Date 11/20/19      PT LONG TERM GOAL #4   Title Pt will return to driving short distances (20-30 minutes) for independent community mobility for grocery shopping and doctor's appointments    Baseline 6/21: not driving    Time 8    Period Weeks    Status On-going    Target Date 11/20/19                 Plan - 10/23/19 1439    Clinical Impression Statement Pt. able to complete dynamic single leg balance activities in // bars. Pt. able to ambulate in // bars and in walker with improved gait quality, pt. demonstrates equal step length and improved terminal knee extension on R LE. Pt. able to begin decreasing UE support in // bars to just L hand, not able  to decrease UE support in walker as well. Pt. able to complete stairs today with step to pattern. Pt. able to complete reciprocal pattern, however felt increased residual limb pressure on R LE. PT measured R quad lag at -8 deg. Pt will continue to benefit from skilled PT to decrease risk of falling.    Personal Factors and Comorbidities Comorbidity 2    Comorbidities see med hx    Examination-Activity Limitations Locomotion Level;Sit;Squat;Stand    Examination-Participation Restrictions Meal Prep;Community Activity;Driving    Stability/Clinical Decision Making Evolving/Moderate complexity    Clinical Decision Making Moderate    Rehab Potential Good    PT Frequency 2x / week    PT Duration 4 weeks    PT Treatment/Interventions ADLs/Self Care Home Management;Neuromuscular re-education;Balance training;Therapeutic exercise;Therapeutic activities;Functional mobility training;Stair training;Gait training;Patient/family education;Prosthetic Training;Manual techniques;Energy conservation;Passive range of motion    PT Next Visit Plan cont progressing LE strengthening, gt training, balance    PT Home Exercise Plan kept previous HEP, added sit to stand with emphasis on normal posture through trunk/pelvis/LE in standing 2x5    Consulted and Agree with Plan of Care Patient           Patient will benefit from skilled therapeutic intervention in order to improve the following deficits and impairments:  Abnormal gait, Decreased coordination, Decreased range of motion, Difficulty walking, Prosthetic Dependency, Decreased endurance, Decreased activity tolerance, Decreased knowledge of precautions, Impaired perceived functional ability, Pain, Decreased balance, Impaired flexibility, Postural dysfunction, Decreased strength, Decreased mobility  Visit Diagnosis: Below-knee amputation of right lower extremity (HCC)  Other abnormalities of gait and mobility  Muscle weakness (generalized)  Pain in right lower  leg     Problem List Patient Active Problem List   Diagnosis Date Noted  . Cocaine abuse ([place]) 07/26/2019  . Diabetes mellitus without  complication ([place])   . GERD (gastroesophageal reflux disease)   . COPD (chronic obstructive pulmonary disease) ([place])   . GIB (gastrointestinal bleeding)   . HLD (hyperlipidemia)   . Depression   . Elevated troponin   . Syncope   . Elevated troponin I level   . Anemia of chronic disease 05/16/2019  . Dizziness   . Polyp of colon   . Acute upper GI bleeding 04/13/2019  . Anemia associated with acute blood loss 04/13/2019  . Chronic anticoagulation 04/13/2019  . Hx of right BKA ([place]) 04/13/2019  . Symptomatic anemia 04/13/2019  . Upper GI bleed 04/13/2019  . Acute GI bleeding 04/13/2019  . 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 Melena   . DU (duodenal ulcer)   . DKA (diabetic ketoacidoses) ([place]) 12/14/2018  . Atherosclerosis of artery of extremity with rest pain ([place]) 08/28/2018  . PAD (peripheral artery disease) ([place]) 07/14/2018  . Abdominal pain 06/13/2018  . HTN (hypertension) 06/13/2018  . Non-insulin dependent type 2 diabetes mellitus ([place]) 06/13/2018  . Elevated lactic acid level 06/13/2018   Pura Spice, PT, DPT # [id number] Carlyle Basques, SPT 10/24/2019, 9:40 AM  [facility] [address] [place], [place], [address] Phone: [phone]   Fax:  [phone]  Name: Sylvia [name] MRN: [id number] Date of Birth: 02-28-1956

## 2019-10-25 ENCOUNTER — Other Ambulatory Visit: Payer: Self-pay

## 2019-10-25 ENCOUNTER — Encounter: Payer: Self-pay | Admitting: Physical Therapy

## 2019-10-25 ENCOUNTER — Ambulatory Visit (INDEPENDENT_AMBULATORY_CARE_PROVIDER_SITE_OTHER): Payer: Medicaid Other | Admitting: Gastroenterology

## 2019-10-25 ENCOUNTER — Ambulatory Visit: Payer: Medicaid Other | Admitting: Physical Therapy

## 2019-10-25 ENCOUNTER — Encounter: Payer: Self-pay | Admitting: Gastroenterology

## 2019-10-25 VITALS — BP 177/73 | HR 118 | Temp 98.3°F | Ht 67.0 in | Wt 142.0 lb

## 2019-10-25 DIAGNOSIS — M79661 Pain in right lower leg: Secondary | ICD-10-CM

## 2019-10-25 DIAGNOSIS — D649 Anemia, unspecified: Secondary | ICD-10-CM | POA: Diagnosis not present

## 2019-10-25 DIAGNOSIS — M6281 Muscle weakness (generalized): Secondary | ICD-10-CM

## 2019-10-25 DIAGNOSIS — S88111A Complete traumatic amputation at level between knee and ankle, right lower leg, initial encounter: Secondary | ICD-10-CM

## 2019-10-25 DIAGNOSIS — R12 Heartburn: Secondary | ICD-10-CM

## 2019-10-25 DIAGNOSIS — Z86010 Personal history of colonic polyps: Secondary | ICD-10-CM

## 2019-10-25 DIAGNOSIS — R2689 Other abnormalities of gait and mobility: Secondary | ICD-10-CM

## 2019-10-25 MED ORDER — FAMOTIDINE 20 MG PO TABS: 20.0000 mg | ORAL_TABLET | Freq: Two times a day (BID) | ORAL | 1 refills | Status: DC

## 2019-10-25 MED ORDER — NA SULFATE-K SULFATE-MG SULF 17.5-3.13-1.6 GM/177ML PO SOLN: ORAL | 0 refills | Status: DC

## 2019-10-25 MED ORDER — PEG 3350-KCL-NA BICARB-NACL 420 G PO SOLR: ORAL | 0 refills | Status: DC

## 2019-10-25 NOTE — Therapy (Signed)
[facility] [address] [place], [place], [address] Phone: [phone]   Fax:  [phone]  Physical Therapy Treatment  Patient Details  Name: Sylvia [name] MRN: [id number] Date of Birth: 1955/06/22 No data recorded  Encounter Date: 10/25/2019   PT End of Session - 10/25/19 1449    Visit Number 10    Number of Visits 17    Date for PT Re-Evaluation 11/20/19    Authorization - Visit Number 2    Authorization - Number of Visits 10    PT Start Time 3151    PT Stop Time 1450    PT Time Calculation (min) 45 min    Equipment Utilized During Treatment Gait belt    Activity Tolerance Patient tolerated treatment well;Patient limited by fatigue    Behavior During Therapy WFL for tasks assessed/performed           Past Medical History:  Diagnosis Date  . Anemia of chronic disease 05/16/2019  . Anxiety    h/o  . COPD (chronic obstructive pulmonary disease) ([place])   . Diabetes mellitus without complication ([place])   . GERD (gastroesophageal reflux disease)   . Hypertension    bp under control-off meds since 2019    Past Surgical History:  Procedure Laterality Date  . ABDOMINAL HYSTERECTOMY    . AMPUTATION Right 12/27/2018   Procedure: AMPUTATION BELOW KNEE;  Surgeon: Algernon Huxley, MD;  Location: [facility];  Service: General;  Laterality: Right;  . CHOLECYSTECTOMY    . COLONOSCOPY WITH PROPOFOL N/A 04/16/2019   Procedure: COLONOSCOPY WITH PROPOFOL;  Surgeon: Virgel Manifold, MD;  Location: [facility];  Service: Endoscopy;  Laterality: N/A;  . ESOPHAGOGASTRODUODENOSCOPY (EGD) WITH PROPOFOL N/A 06/15/2018   Procedure: ESOPHAGOGASTRODUODENOSCOPY (EGD) WITH PROPOFOL;  Surgeon: Toledo, Benay Pike, MD;  Location: [facility];  Service: Gastroenterology;  Laterality: N/A;  . ESOPHAGOGASTRODUODENOSCOPY (EGD) WITH PROPOFOL N/A 01/03/2019   Procedure: ESOPHAGOGASTRODUODENOSCOPY (EGD) WITH PROPOFOL;  Surgeon: Lucilla Lame, MD;   Location: [facility];  Service: Endoscopy;  Laterality: N/A;  . GIVENS CAPSULE STUDY  04/16/2019   Procedure: GIVENS CAPSULE STUDY;  Surgeon: Virgel Manifold, MD;  Location: [facility];  Service: Endoscopy;;  . GIVENS CAPSULE STUDY N/A 06/25/2019   Procedure: GIVENS CAPSULE STUDY;  Surgeon: Jonathon Bellows, MD;  Location: [facility];  Service: Gastroenterology;  Laterality: N/A;  . LOWER EXTREMITY ANGIOGRAPHY Right 08/21/2018   Procedure: LOWER EXTREMITY ANGIOGRAPHY;  Surgeon: Algernon Huxley, MD;  Location: [place] [facility];  Service: Cardiovascular;  Laterality: Right;  . LOWER EXTREMITY ANGIOGRAPHY Left 08/28/2018   Procedure: LOWER EXTREMITY ANGIOGRAPHY;  Surgeon: Algernon Huxley, MD;  Location: [place] [facility];  Service: Cardiovascular;  Laterality: Left;  . LOWER EXTREMITY ANGIOGRAPHY Right 08/28/2018   Procedure: Lower Extremity Angiography;  Surgeon: Algernon Huxley, MD;  Location: [place] [facility];  Service: Cardiovascular;  Laterality: Right;  . LOWER EXTREMITY ANGIOGRAPHY Right 12/18/2018   Procedure: Lower Extremity Angiography;  Surgeon: Algernon Huxley, MD;  Location: [place] [facility];  Service: Cardiovascular;  Laterality: Right;  . LOWER EXTREMITY ANGIOGRAPHY Right 12/18/2018   Procedure: Lower Extremity Angiography;  Surgeon: Algernon Huxley, MD;  Location: [place] [facility];  Service: Cardiovascular;  Laterality: Right;  . LOWER EXTREMITY ANGIOGRAPHY Left 12/21/2018   Procedure: Lower Extremity Angiography;  Surgeon: Algernon Huxley, MD;  Location: [place] [facility];  Service: Cardiovascular;  Laterality: Left;  . LOWER EXTREMITY ANGIOGRAPHY Right 12/21/2018  Procedure: Lower Extremity Angiography;  Surgeon: Algernon Huxley, MD;  Location: [place] [facility];  Service: Cardiovascular;  Laterality: Right;  . LOWER EXTREMITY INTERVENTION N/A 12/22/2018   Procedure: LOWER EXTREMITY INTERVENTION;  Surgeon: Algernon Huxley, MD;  Location: [place] [facility];  Service:  Cardiovascular;  Laterality: N/A;    There were no vitals filed for this visit.   Subjective Assessment - 10/25/19 1443    Subjective Pt. reports she has been able to continue standing for prolonged periods of time at home. Pt. brought 5 ply sock today for prosthetic reports feeling better. Pt. did not take gabapentin before session, so was feeling some phantom pain.    Pertinent History pt fell 6x before she got her prosthesis; 12/20/18 pt had below knee ampulation due to infection/gangrene    Limitations Sitting;Standing;Walking    How long can you sit comfortably? 1 hour    How long can you stand comfortably? 10 minutes (with prosthesis)    How long can you walk comfortably? A few minutes with some short rest breaks in standing    Patient Stated Goals to be able to do 7-8 steps to get in her home.  She has a ramp she can use now; to be able to walk again without AD.  She wants to be able to stand 1.5 hrs for cooking in her kitchen.    Currently in Pain? No/denies               Neuro Re-Ed:   // bars:  Ambulating no AD forwards x5. Verbal cueing for widening BOS  Standing fishing for standing tolerance: 2.5 min  Standing fishing with weight shift on RLE: 2.5 min  Walking mini lunges: x3   Gait training:  Ambulating in hallway with focus on gait speed, equal step length, and equal stance time on BLE's. 68'x3 in hallway. Stair training with upright posture         PT Short Term Goals - 10/24/19 0923      PT SHORT TERM GOAL #1   Title Improve R knee extension AROM to 0 degrees to promote optimal gait mechanics with prosthesis    Baseline -10 degrees, 6/21: -2 degrees.   7/6: R quad lag -8 deg.    Time 4    Period Weeks    Status Partially Met    Target Date 11/20/19      PT SHORT TERM GOAL #2   Title Pt will be able to tolerate 20 min standing interventions without requiring sitting rest break    Baseline 3 min, 6/21: able to perform with her FWW or parallel bars     Time 4    Period Weeks    Status Achieved    Target Date 10/23/19             PT Long Term Goals - 10/23/19 1714      PT LONG TERM GOAL #1   Title Pt will be able to ascend/descend 8 stairs independently with prosthesis    Baseline unable, 6/21: pt able to ascend 1 8-inch step in parallel bars with bilateral railing using L LE to ascend and R to descend; 10/23/19: able to ascend/descend 4 steps with reciprocal pattern, limited by residual limb pain/pressure    Time 4    Period Weeks    Status Partially Met    Target Date 11/20/19      PT LONG TERM GOAL #2   Title Pt will be able to  stand x 1 hour wearing prosthesis with intermittent rest break for cooking full meal in her kitchen    Baseline 6/21: pt standing to cook but requires a chair to sit and cook from after 10-15 min.    Time 6    Period Weeks    Status Achieved    Target Date 10/23/19      PT LONG TERM GOAL #3   Title Improve FOTO score to >45/52 indicating improved overall activity tolerance    Baseline 31/52, 6/21: attempted to do FOTO but website not accessible- will attempt again at next visit; 10/23/2019: 33/52    Time 6    Period Weeks    Status Not Met    Target Date 11/20/19      PT LONG TERM GOAL #4   Title Pt will return to driving short distances (20-30 minutes) for independent community mobility for grocery shopping and doctor's appointments    Baseline 6/21: not driving    Time 8    Period Weeks    Status On-going    Target Date 11/20/19                 Plan - 10/25/19 1646    Clinical Impression Statement Pt. progressing well with mod. independence/SBA and use of SPC in clinic.  Pt. ambulates with more consistent recip. gait pattern and able to complete stairs with improved upright posture.  PT adjusted ply sock for better fit of prosthetic leg to improve standing/walking.  No changes to HEP at this time.    Personal Factors and Comorbidities Comorbidity 2    Comorbidities see med hx     Examination-Activity Limitations Locomotion Level;Sit;Squat;Stand    Examination-Participation Restrictions Meal Prep;Community Activity;Driving    Stability/Clinical Decision Making Evolving/Moderate complexity    Clinical Decision Making Moderate    Rehab Potential Good    PT Frequency 2x / week    PT Duration 4 weeks    PT Treatment/Interventions ADLs/Self Care Home Management;Neuromuscular re-education;Balance training;Therapeutic exercise;Therapeutic activities;Functional mobility training;Stair training;Gait training;Patient/family education;Prosthetic Training;Manual techniques;Energy conservation;Passive range of motion    PT Next Visit Plan cont progressing LE strengthening, gt training, balance    PT Home Exercise Plan kept previous HEP, added sit to stand with emphasis on normal posture through trunk/pelvis/LE in standing 2x5    Consulted and Agree with Plan of Care Patient           Patient will benefit from skilled therapeutic intervention in order to improve the following deficits and impairments:  Abnormal gait, Decreased coordination, Decreased range of motion, Difficulty walking, Prosthetic Dependency, Decreased endurance, Decreased activity tolerance, Decreased knowledge of precautions, Impaired perceived functional ability, Pain, Decreased balance, Impaired flexibility, Postural dysfunction, Decreased strength, Decreased mobility  Visit Diagnosis: Below-knee amputation of right lower extremity (HCC)  Other abnormalities of gait and mobility  Muscle weakness (generalized)  Pain in right lower leg     Problem List Patient Active Problem List   Diagnosis Date Noted  . Cocaine abuse ([place]) 07/26/2019  . Diabetes mellitus without complication ([place])   . GERD (gastroesophageal reflux disease)   . COPD (chronic obstructive pulmonary disease) ([place])   . GIB (gastrointestinal bleeding)   . HLD (hyperlipidemia)   . Depression   . Elevated troponin   . Syncope   .  Elevated troponin I level   . Anemia of chronic disease 05/16/2019  . Dizziness   . Polyp of colon   . Acute upper GI bleeding 04/13/2019  . Anemia associated  with acute blood loss 04/13/2019  . Chronic anticoagulation 04/13/2019  . Hx of right BKA ([place]) 04/13/2019  . Symptomatic anemia 04/13/2019  . Upper GI bleed 04/13/2019  . Acute GI bleeding 04/13/2019  . 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 Melena   . DU (duodenal ulcer)   . DKA (diabetic ketoacidoses) ([place]) 12/14/2018  . Atherosclerosis of artery of extremity with rest pain ([place]) 08/28/2018  . PAD (peripheral artery disease) ([place]) 07/14/2018  . Abdominal pain 06/13/2018  . HTN (hypertension) 06/13/2018  . Non-insulin dependent type 2 diabetes mellitus ([place]) 06/13/2018  . Elevated lactic acid level 06/13/2018   Pura Spice, PT, DPT # [id number] Carlyle Basques, SPT 10/25/2019, 8:14 PM  [facility] [address] [place], [place], [address] Phone: [phone]   Fax:  [phone]  Name: Sylvia [name] MRN: [id number] Date of Birth: 13-Jul-1955

## 2019-10-25 NOTE — Patient Instructions (Signed)
Your insurance did not cover for Suprep, therefore, you have to take Golytely.

## 2019-10-25 NOTE — Addendum Note (Signed)
Addended by: Adela Ports on: 10/25/2019 04:26 PM   Modules accepted: Orders

## 2019-10-25 NOTE — Therapy (Deleted)
[facility] [address] [place], [place], [address] Phone: [phone]   Fax:  [phone]  Physical Therapy Treatment  Patient Details  Name: Sylvia [name] MRN: [id number] Date of Birth: 1955/06/22 No data recorded  Encounter Date: 10/25/2019   PT End of Session - 10/25/19 1449    Visit Number 10    Number of Visits 17    Date for PT Re-Evaluation 11/20/19    Authorization - Visit Number 2    Authorization - Number of Visits 10    PT Start Time 3151    PT Stop Time 1450    PT Time Calculation (min) 45 min    Equipment Utilized During Treatment Gait belt    Activity Tolerance Patient tolerated treatment well;Patient limited by fatigue    Behavior During Therapy WFL for tasks assessed/performed           Past Medical History:  Diagnosis Date  . Anemia of chronic disease 05/16/2019  . Anxiety    h/o  . COPD (chronic obstructive pulmonary disease) ([place])   . Diabetes mellitus without complication ([place])   . GERD (gastroesophageal reflux disease)   . Hypertension    bp under control-off meds since 2019    Past Surgical History:  Procedure Laterality Date  . ABDOMINAL HYSTERECTOMY    . AMPUTATION Right 12/27/2018   Procedure: AMPUTATION BELOW KNEE;  Surgeon: Algernon Huxley, MD;  Location: [facility];  Service: General;  Laterality: Right;  . CHOLECYSTECTOMY    . COLONOSCOPY WITH PROPOFOL N/A 04/16/2019   Procedure: COLONOSCOPY WITH PROPOFOL;  Surgeon: Virgel Manifold, MD;  Location: [facility];  Service: Endoscopy;  Laterality: N/A;  . ESOPHAGOGASTRODUODENOSCOPY (EGD) WITH PROPOFOL N/A 06/15/2018   Procedure: ESOPHAGOGASTRODUODENOSCOPY (EGD) WITH PROPOFOL;  Surgeon: Toledo, Benay Pike, MD;  Location: [facility];  Service: Gastroenterology;  Laterality: N/A;  . ESOPHAGOGASTRODUODENOSCOPY (EGD) WITH PROPOFOL N/A 01/03/2019   Procedure: ESOPHAGOGASTRODUODENOSCOPY (EGD) WITH PROPOFOL;  Surgeon: Lucilla Lame, MD;   Location: [facility];  Service: Endoscopy;  Laterality: N/A;  . GIVENS CAPSULE STUDY  04/16/2019   Procedure: GIVENS CAPSULE STUDY;  Surgeon: Virgel Manifold, MD;  Location: [facility];  Service: Endoscopy;;  . GIVENS CAPSULE STUDY N/A 06/25/2019   Procedure: GIVENS CAPSULE STUDY;  Surgeon: Jonathon Bellows, MD;  Location: [facility];  Service: Gastroenterology;  Laterality: N/A;  . LOWER EXTREMITY ANGIOGRAPHY Right 08/21/2018   Procedure: LOWER EXTREMITY ANGIOGRAPHY;  Surgeon: Algernon Huxley, MD;  Location: [place] [facility];  Service: Cardiovascular;  Laterality: Right;  . LOWER EXTREMITY ANGIOGRAPHY Left 08/28/2018   Procedure: LOWER EXTREMITY ANGIOGRAPHY;  Surgeon: Algernon Huxley, MD;  Location: [place] [facility];  Service: Cardiovascular;  Laterality: Left;  . LOWER EXTREMITY ANGIOGRAPHY Right 08/28/2018   Procedure: Lower Extremity Angiography;  Surgeon: Algernon Huxley, MD;  Location: [place] [facility];  Service: Cardiovascular;  Laterality: Right;  . LOWER EXTREMITY ANGIOGRAPHY Right 12/18/2018   Procedure: Lower Extremity Angiography;  Surgeon: Algernon Huxley, MD;  Location: [place] [facility];  Service: Cardiovascular;  Laterality: Right;  . LOWER EXTREMITY ANGIOGRAPHY Right 12/18/2018   Procedure: Lower Extremity Angiography;  Surgeon: Algernon Huxley, MD;  Location: [place] [facility];  Service: Cardiovascular;  Laterality: Right;  . LOWER EXTREMITY ANGIOGRAPHY Left 12/21/2018   Procedure: Lower Extremity Angiography;  Surgeon: Algernon Huxley, MD;  Location: [place] [facility];  Service: Cardiovascular;  Laterality: Left;  . LOWER EXTREMITY ANGIOGRAPHY Right 12/21/2018  Procedure: Lower Extremity Angiography;  Surgeon: Algernon Huxley, MD;  Location: [place] [facility];  Service: Cardiovascular;  Laterality: Right;  . LOWER EXTREMITY INTERVENTION N/A 12/22/2018   Procedure: LOWER EXTREMITY INTERVENTION;  Surgeon: Algernon Huxley, MD;  Location: [place] [facility];  Service:  Cardiovascular;  Laterality: N/A;    There were no vitals filed for this visit.   Subjective Assessment - 10/25/19 1443    Subjective Pt. reports she has been able to continue standing for prolonged periods of time at home. Pt. brought 5 ply sock today for prosthetic reports feeling better. Pt. did not take gabapentin before session, so was feeling some phantom pain.    Pertinent History pt fell 6x before she got her prosthesis; 12/20/18 pt had below knee ampulation due to infection/gangrene    Limitations Sitting;Standing;Walking    How long can you sit comfortably? 1 hour    How long can you stand comfortably? 10 minutes (with prosthesis)    How long can you walk comfortably? A few minutes with some short rest breaks in standing    Patient Stated Goals to be able to do 7-8 steps to get in her home.  She has a ramp she can use now; to be able to walk again without AD.  She wants to be able to stand 1.5 hrs for cooking in her kitchen.    Currently in Pain? No/denies          Neuro Re-Ed:   // bars:  Ambulating no AD forwards x5. Verbal cueing for widening BOS  Standing fishing for standing tolerance: 2.5 min  Standing fishing with weight shift on RLE: 2.5 min  Walking mini lunges: x3   Gait training:  Ambulating in hallway with focus on gait speed, equal step length, and equal stance time on BLE's. 68'x3 in hallway.   PT Short Term Goals - 10/24/19 2330      PT SHORT TERM GOAL #1   Title Improve R knee extension AROM to 0 degrees to promote optimal gait mechanics with prosthesis    Baseline -10 degrees, 6/21: -2 degrees.   7/6: R quad lag -8 deg.    Time 4    Period Weeks    Status Partially Met    Target Date 11/20/19      PT SHORT TERM GOAL #2   Title Pt will be able to tolerate 20 min standing interventions without requiring sitting rest break    Baseline 3 min, 6/21: able to perform with her FWW or parallel bars    Time 4    Period Weeks    Status Achieved    Target  Date 10/23/19             PT Long Term Goals - 10/23/19 1714      PT LONG TERM GOAL #1   Title Pt will be able to ascend/descend 8 stairs independently with prosthesis    Baseline unable, 6/21: pt able to ascend 1 8-inch step in parallel bars with bilateral railing using L LE to ascend and R to descend; 10/23/19: able to ascend/descend 4 steps with reciprocal pattern, limited by residual limb pain/pressure    Time 4    Period Weeks    Status Partially Met    Target Date 11/20/19      PT LONG TERM GOAL #2   Title Pt will be able to stand x 1 hour wearing prosthesis with intermittent rest break for cooking full meal in her  kitchen    Baseline 6/21: pt standing to cook but requires a chair to sit and cook from after 10-15 min.    Time 6    Period Weeks    Status Achieved    Target Date 10/23/19      PT LONG TERM GOAL #3   Title Improve FOTO score to >45/52 indicating improved overall activity tolerance    Baseline 31/52, 6/21: attempted to do FOTO but website not accessible- will attempt again at next visit; 10/23/2019: 33/52    Time 6    Period Weeks    Status Not Met    Target Date 11/20/19      PT LONG TERM GOAL #4   Title Pt will return to driving short distances (20-30 minutes) for independent community mobility for grocery shopping and doctor's appointments    Baseline 6/21: not driving    Time 8    Period Weeks    Status On-going    Target Date 11/20/19                 Plan - 10/25/19 1646    Personal Factors and Comorbidities Comorbidity 2    Comorbidities see med hx    Examination-Activity Limitations Locomotion Level;Sit;Squat;Stand    Examination-Participation Restrictions Meal Prep;Community Activity;Driving    Stability/Clinical Decision Making Evolving/Moderate complexity    Rehab Potential Good    PT Frequency 2x / week    PT Duration 4 weeks    PT Treatment/Interventions ADLs/Self Care Home Management;Neuromuscular re-education;Balance  training;Therapeutic exercise;Therapeutic activities;Functional mobility training;Stair training;Gait training;Patient/family education;Prosthetic Training;Manual techniques;Energy conservation;Passive range of motion    PT Next Visit Plan cont progressing LE strengthening, gt training, balance    PT Home Exercise Plan kept previous HEP, added sit to stand with emphasis on normal posture through trunk/pelvis/LE in standing 2x5    Consulted and Agree with Plan of Care Patient           Patient will benefit from skilled therapeutic intervention in order to improve the following deficits and impairments:  Abnormal gait, Decreased coordination, Decreased range of motion, Difficulty walking, Prosthetic Dependency, Decreased endurance, Decreased activity tolerance, Decreased knowledge of precautions, Impaired perceived functional ability, Pain, Decreased balance, Impaired flexibility, Postural dysfunction, Decreased strength, Decreased mobility  Visit Diagnosis: Below-knee amputation of right lower extremity (HCC)  Other abnormalities of gait and mobility  Muscle weakness (generalized)  Pain in right lower leg     Problem List Patient Active Problem List   Diagnosis Date Noted  . Cocaine abuse ([place]) 07/26/2019  . Diabetes mellitus without complication ([place])   . GERD (gastroesophageal reflux disease)   . COPD (chronic obstructive pulmonary disease) ([place])   . GIB (gastrointestinal bleeding)   . HLD (hyperlipidemia)   . Depression   . Elevated troponin   . Syncope   . Elevated troponin I level   . Anemia of chronic disease 05/16/2019  . Dizziness   . Polyp of colon   . Acute upper GI bleeding 04/13/2019  . Anemia associated with acute blood loss 04/13/2019  . Chronic anticoagulation 04/13/2019  . Hx of right BKA ([place]) 04/13/2019  . Symptomatic anemia 04/13/2019  . Upper GI bleed 04/13/2019  . Acute GI bleeding 04/13/2019  . Phantom pain after amputation of lower extremity ([place])  01/30/2019  . Melena   . DU (duodenal ulcer)   . DKA (diabetic ketoacidoses) ([place]) 12/14/2018  . Atherosclerosis of artery of extremity with rest pain ([place]) 08/28/2018  . PAD (peripheral artery disease) (  [place]) 07/14/2018  . Abdominal pain 06/13/2018  . HTN (hypertension) 06/13/2018  . Non-insulin dependent type 2 diabetes mellitus ([place]) 06/13/2018  . Elevated lactic acid level 06/13/2018    Rachael Fee 10/25/2019, 4:55 PM  [facility] [employer] [address]. [place], [place], [address] Phone: [phone]   Fax:  [phone]  Name: Abrianna Sidman MRN: [id number] Date of Birth: 19-Jan-1956

## 2019-10-25 NOTE — Progress Notes (Signed)
Sylvia Bouillon, MD [address]  [address]  [place], [place] [address]  Main: [phone]  Fax: [phone]   Primary Care Physician: [facility]   Chief Complaint  Patient presents with  . Follow-up    Normocytic anemia    HPI: Sylvia [name] is a 64 y.o. female here for follow-up of normocytic anemia.  Patient follows with Dr. Cathie Hoops for the same.  Recent small bowel capsule study, read by Dr. 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  The patient denies abdominal or flank pain, anorexia, nausea or vomiting, dysphagia, change in bowel habits or black or bloody stools or weight loss.  Does report heartburn 2-3 times a week on Protonix.  Previous history: Patient recently hospitalized with syncope, diagnosed with demand ischemia   Denies any melena or hematochezia recently.  No abdominal pain or dysphagia.  No nausea or vomiting.    Patient underwent EGD colonoscopy and small bowel capsule study during her hospitalization in December 2020.  EGD was negative, colonoscopy with poor prep, and small polyps removed, small bowel capsule study did not reach this small bowel and was inconclusive.    Sees Dr. Cathie Hoops of hematology who initially had her on iron replacement.  This has been discontinued as her anemia is considered to be multifactorial with normocytic anemia being the diagnosis.  Current Outpatient Medications  Medication Sig Dispense Refill  . acetaminophen (TYLENOL) 325 MG tablet Take 650 mg by mouth every 6 (six) hours as needed.    Marland Kitchen albuterol (PROVENTIL HFA;VENTOLIN HFA) 108 (90 Base) MCG/ACT inhaler Inhale 2 puffs into the lungs every 6 (six) hours as needed for wheezing or shortness of breath. 1 Inhaler 2  . apixaban (ELIQUIS) 2.5 MG TABS tablet Take 1 tablet (2.5 mg total) by mouth 2 (two) times daily. 60 tablet 3  . atorvastatin (LIPITOR) 10 MG tablet Take 1 tablet by mouth daily.    . cetirizine (ZYRTEC) 10 MG tablet Take 10  mg by mouth daily.    . clopidogrel (PLAVIX) 75 MG tablet Take 75 mg by mouth daily.    Marland Kitchen docusate sodium (COLACE) 100 MG capsule Take 100 mg by mouth 2 (two) times daily.    . DULoxetine (CYMBALTA) 30 MG capsule Take 30 mg by mouth daily.    . ferrous sulfate 325 (65 FE) MG tablet Take 325 mg by mouth daily.     Marland Kitchen gabapentin (NEURONTIN) 300 MG capsule Take 2 Capsules (600 mg) at Bedtime and 1 capsule (300 mg) in the afternoon 90 capsule 2  . lisinopril (ZESTRIL) 10 MG tablet Take 1 tablet (10 mg total) by mouth daily. 30 tablet 1  . Melatonin 1 MG TABS Take 1 tablet by mouth at bedtime.    . Multiple Vitamins-Minerals (WOMENS MULTIVITAMIN PO) Take 1 tablet by mouth daily.    . rosuvastatin (CRESTOR) 5 MG tablet Take 1 tablet (5 mg total) by mouth daily at 6 PM. 30 tablet 1  . sitaGLIPtin (JANUVIA) 100 MG tablet Take 100 mg by mouth daily.    . famotidine (PEPCID) 20 MG tablet Take 1 tablet (20 mg total) by mouth 2 (two) times daily. 60 tablet 1   No current facility-administered medications for this visit.    Allergies as of 10/25/2019 - Review Complete 10/25/2019  Allergen Reaction Noted  . Metformin and related Diarrhea 06/04/2019  . Penicillins Hives 11/14/2015  . Tramadol Itching 12/14/2018    ROS:  General: Negative for anorexia, weight  loss, fever, chills, fatigue, weakness. ENT: Negative for hoarseness, difficulty swallowing , nasal congestion. CV: Negative for chest pain, angina, palpitations, dyspnea on exertion, peripheral edema.  Respiratory: Negative for dyspnea at rest, dyspnea on exertion, cough, sputum, wheezing.  GI: See history of present illness. GU:  Negative for dysuria, hematuria, urinary incontinence, urinary frequency, nocturnal urination.  Endo: Negative for unusual weight change.    Physical Examination:   BP (!) 177/73   Pulse (!) 118   Temp 98.3 F (36.8 C) (Oral)   Ht 5\' 7"  (1.702 m)   Wt 142 lb (64.4 kg)   BMI 22.24 kg/m   General:  Well-nourished, well-developed in no acute distress.  Eyes: No icterus. Conjunctivae pink. Mouth: Oropharyngeal mucosa moist and pink , no lesions erythema or exudate. Neck: Supple, Trachea midline Abdomen: Bowel sounds are normal, nontender, nondistended, no hepatosplenomegaly or masses, no abdominal bruits or hernia , no rebound or guarding.   Extremities: No lower extremity edema. No clubbing or deformities. Neuro: Alert and oriented x 3.  Grossly intact. Skin: Warm and dry, no jaundice.   Psych: Alert and cooperative, normal mood and affect.   Labs: CMP     Component Value Date/Time   NA 141 07/26/2019 0412   K 3.4 (L) 07/26/2019 0412   CL 112 (H) 07/26/2019 0412   CO2 22 07/26/2019 0412   GLUCOSE 274 (H) 07/26/2019 0412   BUN 12 07/26/2019 0412   CREATININE 0.66 07/26/2019 0412   CALCIUM 9.0 07/26/2019 0412   PROT 7.6 05/16/2019 1142   ALBUMIN 3.9 05/16/2019 1142   AST 18 05/16/2019 1142   ALT 17 05/16/2019 1142   ALKPHOS 91 05/16/2019 1142   BILITOT 0.7 05/16/2019 1142   GFRNONAA >60 07/26/2019 0412   GFRAA >60 07/26/2019 0412   Lab Results  Component Value Date   WBC 7.9 07/26/2019   HGB 9.0 (L) 07/26/2019   HCT 26.4 (L) 07/26/2019   MCV 87.1 07/26/2019   PLT 314 07/26/2019    Imaging Studies: No results found.  Assessment and Plan:   Sylvia [name] is a 64 y.o. y/o female here for follow-up of normocytic anemia and heartburn  Follow-up with Dr. 77 for normocytic anemia  GI work-up for anemia completed with EGD, colonoscopy and small bowel capsule study.  Nonbleeding AVM noted on capsule study in April 2021  If patient has worsening anemia, or active bleeding, may need to consider treating that AVM.  However, with normocytic anemia, and normal iron levels in February 2021, no active GI bleeding, no indication for enteroscopy for this at this time  Repeat colonoscopy indicated due to poor prep on last procedure.  Patient will need cardiac  clearance.  I have discussed alternative options, risks & benefits,  which include, but are not limited to, bleeding, infection, perforation,respiratory complication & drug reaction.  The patient agrees with this plan & written consent will be obtained.    Patient would like a refill on her Protonix for heartburn However, she is only having symptoms about 2-3 times a week, which is part of normal variation Change to Pepcid instead for 1 to 2 months and if symptoms do not worsen, discontinue medication after that  If symptoms worsen, can change to once daily Protonix instead of the twice daily medication she has been taking  Patient educated extensively on acid reflux lifestyle modification, including buying a bed wedge, not eating 3 hrs before bedtime, diet modifications, and handout given for the same.    Dr  Vonda Antigua

## 2019-10-26 ENCOUNTER — Telehealth: Payer: Self-pay

## 2019-10-26 NOTE — Telephone Encounter (Signed)
I faxed a cardiac clearance for Mrs. Sylvia [name], PAC at [facility]. Waiting on response.

## 2019-10-29 ENCOUNTER — Encounter: Payer: Self-pay | Admitting: Physical Therapy

## 2019-10-29 ENCOUNTER — Ambulatory Visit: Payer: Medicaid Other | Admitting: Physical Therapy

## 2019-10-29 ENCOUNTER — Other Ambulatory Visit: Payer: Self-pay

## 2019-10-29 DIAGNOSIS — R2689 Other abnormalities of gait and mobility: Secondary | ICD-10-CM

## 2019-10-29 DIAGNOSIS — S88111A Complete traumatic amputation at level between knee and ankle, right lower leg, initial encounter: Secondary | ICD-10-CM | POA: Diagnosis not present

## 2019-10-29 DIAGNOSIS — M6281 Muscle weakness (generalized): Secondary | ICD-10-CM

## 2019-10-29 DIAGNOSIS — M79661 Pain in right lower leg: Secondary | ICD-10-CM

## 2019-10-29 NOTE — Therapy (Signed)
[place] [facility] [address] [place], [place], [address] Phone: [phone]   Fax:  [phone]  Physical Therapy Treatment  Patient Details  Name: Sylvia [name] MRN: [id number] Date of Birth: 08-02-1955 No data recorded  Encounter Date: 10/29/2019   PT End of Session - 10/29/19 0950    Visit Number 11    Number of Visits 17    Date for PT Re-Evaluation 11/20/19    Authorization - Visit Number 3    Authorization - Number of Visits 10    PT Start Time [phone]    PT Stop Time 1039    PT Time Calculation (min) 57 min    Equipment Utilized During Treatment Gait belt    Activity Tolerance Patient tolerated treatment well;Patient limited by fatigue    Behavior During Therapy [facility] for tasks assessed/performed           Past Medical History:  Diagnosis Date   Anemia of chronic disease 05/16/2019   Anxiety    h/o   COPD (chronic obstructive pulmonary disease) ([place])    Diabetes mellitus without complication ([place])    GERD (gastroesophageal reflux disease)    Hypertension    bp under control-off meds since 2019    Past Surgical History:  Procedure Laterality Date   ABDOMINAL HYSTERECTOMY     AMPUTATION Right 12/27/2018   Procedure: AMPUTATION BELOW KNEE;  Surgeon: Algernon Huxley, MD;  Location: [facility] ORS;  Service: General;  Laterality: Right;   CHOLECYSTECTOMY     COLONOSCOPY WITH PROPOFOL N/A 04/16/2019   Procedure: COLONOSCOPY WITH PROPOFOL;  Surgeon: Virgel Manifold, MD;  Location: [facility] ENDOSCOPY;  Service: Endoscopy;  Laterality: N/A;   ESOPHAGOGASTRODUODENOSCOPY (EGD) WITH PROPOFOL N/A 06/15/2018   Procedure: ESOPHAGOGASTRODUODENOSCOPY (EGD) WITH PROPOFOL;  Surgeon: Toledo, Benay Pike, MD;  Location: [facility] ENDOSCOPY;  Service: Gastroenterology;  Laterality: N/A;   ESOPHAGOGASTRODUODENOSCOPY (EGD) WITH PROPOFOL N/A 01/03/2019   Procedure: ESOPHAGOGASTRODUODENOSCOPY (EGD) WITH PROPOFOL;  Surgeon: Lucilla Lame, MD;   Location: [facility] ENDOSCOPY;  Service: Endoscopy;  Laterality: N/A;   GIVENS CAPSULE STUDY  04/16/2019   Procedure: GIVENS CAPSULE STUDY;  Surgeon: Virgel Manifold, MD;  Location: [facility] ENDOSCOPY;  Service: Endoscopy;;   GIVENS CAPSULE STUDY N/A 06/25/2019   Procedure: GIVENS CAPSULE STUDY;  Surgeon: Jonathon Bellows, MD;  Location: [facility] ENDOSCOPY;  Service: Gastroenterology;  Laterality: N/A;   LOWER EXTREMITY ANGIOGRAPHY Right 08/21/2018   Procedure: LOWER EXTREMITY ANGIOGRAPHY;  Surgeon: Algernon Huxley, MD;  Location: [place] CV LAB;  Service: Cardiovascular;  Laterality: Right;   LOWER EXTREMITY ANGIOGRAPHY Left 08/28/2018   Procedure: LOWER EXTREMITY ANGIOGRAPHY;  Surgeon: Algernon Huxley, MD;  Location: [place] CV LAB;  Service: Cardiovascular;  Laterality: Left;   LOWER EXTREMITY ANGIOGRAPHY Right 08/28/2018   Procedure: Lower Extremity Angiography;  Surgeon: Algernon Huxley, MD;  Location: [place] CV LAB;  Service: Cardiovascular;  Laterality: Right;   LOWER EXTREMITY ANGIOGRAPHY Right 12/18/2018   Procedure: Lower Extremity Angiography;  Surgeon: Algernon Huxley, MD;  Location: [place] CV LAB;  Service: Cardiovascular;  Laterality: Right;   LOWER EXTREMITY ANGIOGRAPHY Right 12/18/2018   Procedure: Lower Extremity Angiography;  Surgeon: Algernon Huxley, MD;  Location: [place] CV LAB;  Service: Cardiovascular;  Laterality: Right;   LOWER EXTREMITY ANGIOGRAPHY Left 12/21/2018   Procedure: Lower Extremity Angiography;  Surgeon: Algernon Huxley, MD;  Location: [place] CV LAB;  Service: Cardiovascular;  Laterality: Left;   LOWER EXTREMITY ANGIOGRAPHY Right 12/21/2018  Procedure: Lower Extremity Angiography;  Surgeon: Algernon Huxley, MD;  Location: [place] CV LAB;  Service: Cardiovascular;  Laterality: Right;   LOWER EXTREMITY INTERVENTION N/A 12/22/2018   Procedure: LOWER EXTREMITY INTERVENTION;  Surgeon: Algernon Huxley, MD;  Location: [place] CV LAB;  Service:  Cardiovascular;  Laterality: N/A;    There were no vitals filed for this visit.   Subjective Assessment - 10/29/19 0951    Subjective Pt. reports she walked with the cane at home this weekend. Pt. continues to wear 5 ply sock. Pt. reports having taken gabapentin prior to therapy.    Pertinent History pt fell 6x before she got her prosthesis; 12/20/18 pt had below knee ampulation due to infection/gangrene    Limitations Sitting;Standing;Walking    How long can you sit comfortably? 1 hour    How long can you stand comfortably? 10 minutes (with prosthesis)    How long can you walk comfortably? A few minutes with some short rest breaks in standing    Patient Stated Goals to be able to do 7-8 steps to get in her home.  She has a ramp she can use now; to be able to walk again without AD.  She wants to be able to stand 1.5 hrs for cooking in her kitchen.    Currently in Pain? No/denies            There. Ex:  Nustep 10 mins: 5 mins level 2, 5 mins no resistance  Neuro Re-ed:  Sitting "driving" activity: pink squishy soccer ball and circular air mat on floor, pt. sitting. Pt. instructed to pretend she's driving and use the items on floor as "pedals." PT gave "start/stop" instructions. Pt. able to react well to instructions, showing improvement on level of control with more reps. 5 mins  Standing cone kicks/taps: Pt. instructed kick with the L foot when a certain color cone was called. Pt. instructed to tap the correct colored cone softly with R foot.   Walking outside with [facility]: Pt. walked on grass and unlevel terrain with CGA. Pt. able to self correct all LOB.   Step up on airex pad in // bars: Pt. instructed to step up with R leg. Pt. Initially with L hand on // bar with CGA, progressed to no hands on // bars with CGA.   Gait training:   Walking with no AD: pt. instructed to walk with no AD initially in // bars, progressed to around clinic and one length of the hallway. Pt. cued allow arm  swing, spend even time on both legs, take even steps, and to increase gait speed. Pt. able to ambulate with consistent reciprocal gait pattern after cueing.          PT Short Term Goals - 10/24/19 5916      PT SHORT TERM GOAL #1   Title Improve R knee extension AROM to 0 degrees to promote optimal gait mechanics with prosthesis    Baseline -10 degrees, 6/21: -2 degrees.   7/6: R quad lag -8 deg.    Time 4    Period Weeks    Status Partially Met    Target Date 11/20/19      PT SHORT TERM GOAL #2   Title Pt will be able to tolerate 20 min standing interventions without requiring sitting rest break    Baseline 3 min, 6/21: able to perform with her FWW or parallel bars    Time 4    Period Weeks  Status Achieved    Target Date 10/23/19             PT Long Term Goals - 10/23/19 1714      PT LONG TERM GOAL #1   Title Pt will be able to ascend/descend 8 stairs independently with prosthesis    Baseline unable, 6/21: pt able to ascend 1 8-inch step in parallel bars with bilateral railing using L LE to ascend and R to descend; 10/23/19: able to ascend/descend 4 steps with reciprocal pattern, limited by residual limb pain/pressure    Time 4    Period Weeks    Status Partially Met    Target Date 11/20/19      PT LONG TERM GOAL #2   Title Pt will be able to stand x 1 hour wearing prosthesis with intermittent rest break for cooking full meal in her kitchen    Baseline 6/21: pt standing to cook but requires a chair to sit and cook from after 10-15 min.    Time 6    Period Weeks    Status Achieved    Target Date 10/23/19      PT LONG TERM GOAL #3   Title Improve FOTO score to >45/52 indicating improved overall activity tolerance    Baseline 31/52, 6/21: attempted to do FOTO but website not accessible- will attempt again at next visit; 10/23/2019: 33/52    Time 6    Period Weeks    Status Not Met    Target Date 11/20/19      PT LONG TERM GOAL #4   Title Pt will return to driving  short distances (20-30 minutes) for independent community mobility for grocery shopping and doctor's appointments    Baseline 6/21: not driving    Time 8    Period Weeks    Status On-going    Target Date 11/20/19                 Plan - 10/29/19 1048    Clinical Impression Statement Pt. able to use SPC or no AD with CGA/SBA while in clinic today. Pt. able to ambulate with consistent reciprocal pattern with SPC, pt. demonstrates decreased trunk rotation when walking with no AD at this time. Pt. completed R hip flexor/quad synergy activities with focus on control to simulate driving. Pt. will continue to benefit from skilled PT to improve functional mobility and independence.    Personal Factors and Comorbidities Comorbidity 2    Comorbidities see med hx    Examination-Activity Limitations Locomotion Level;Sit;Squat;Stand    Examination-Participation Restrictions Meal Prep;Community Activity;Driving    Stability/Clinical Decision Making Evolving/Moderate complexity    Clinical Decision Making Moderate    Rehab Potential Good    PT Frequency 2x / week    PT Duration 4 weeks    PT Treatment/Interventions ADLs/Self Care Home Management;Neuromuscular re-education;Balance training;Therapeutic exercise;Therapeutic activities;Functional mobility training;Stair training;Gait training;Patient/family education;Prosthetic Training;Manual techniques;Energy conservation;Passive range of motion    PT Next Visit Plan cont progressing LE strengthening, gt training, balance    PT Home Exercise Plan kept previous HEP, added sit to stand with emphasis on normal posture through trunk/pelvis/LE in standing 2x5    Consulted and Agree with Plan of Care Patient           Patient will benefit from skilled therapeutic intervention in order to improve the following deficits and impairments:  Abnormal gait, Decreased coordination, Decreased range of motion, Difficulty walking, Prosthetic Dependency, Decreased  endurance, Decreased activity tolerance, Decreased knowledge of precautions,  Impaired perceived functional ability, Pain, Decreased balance, Impaired flexibility, Postural dysfunction, Decreased strength, Decreased mobility  Visit Diagnosis: Below-knee amputation of right lower extremity (HCC)  Other abnormalities of gait and mobility  Muscle weakness (generalized)  Pain in right lower leg     Problem List Patient Active Problem List   Diagnosis Date Noted   Cocaine abuse ([place]) 07/26/2019   Diabetes mellitus without complication (HCC)    GERD (gastroesophageal reflux disease)    COPD (chronic obstructive pulmonary disease) (HCC)    GIB (gastrointestinal bleeding)    HLD (hyperlipidemia)    Depression    Elevated troponin    Syncope    Elevated troponin I level    Anemia of chronic disease 05/16/2019   Dizziness    Polyp of colon    Acute upper GI bleeding 04/13/2019   Anemia associated with acute blood loss 04/13/2019   Chronic anticoagulation 04/13/2019   Hx of right BKA ([place]) 04/13/2019   Symptomatic anemia 04/13/2019   Upper GI bleed 04/13/2019   Acute GI bleeding 04/13/2019   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 [place], [place], [address] Phone: [phone]   Fax:  [phone]  Name: Parrie Rasco MRN: [id number] Date of Birth: 02/04/1956

## 2019-10-31 ENCOUNTER — Other Ambulatory Visit: Payer: Self-pay

## 2019-10-31 ENCOUNTER — Ambulatory Visit: Payer: Medicaid Other | Admitting: Physical Therapy

## 2019-10-31 ENCOUNTER — Encounter: Payer: Self-pay | Admitting: Physical Therapy

## 2019-10-31 DIAGNOSIS — M79661 Pain in right lower leg: Secondary | ICD-10-CM

## 2019-10-31 DIAGNOSIS — S88111A Complete traumatic amputation at level between knee and ankle, right lower leg, initial encounter: Secondary | ICD-10-CM | POA: Diagnosis not present

## 2019-10-31 DIAGNOSIS — R2689 Other abnormalities of gait and mobility: Secondary | ICD-10-CM

## 2019-10-31 DIAGNOSIS — M6281 Muscle weakness (generalized): Secondary | ICD-10-CM

## 2019-10-31 NOTE — Therapy (Signed)
[facility] [address]. [place], [place], [address] Phone: [phone]   Fax:  [phone]  Physical Therapy Treatment  Patient Details  Name: Sylvia [name] MRN: [id number] Date of Birth: December 08, 1955 No data recorded  Encounter Date: 10/31/2019   PT End of Session - 10/31/19 1132    Visit Number 12    Number of Visits 17    Date for PT Re-Evaluation 11/20/19    Authorization - Visit Number 4    Authorization - Number of Visits 10    PT Start Time 1122    PT Stop Time 1205    PT Time Calculation (min) 43 min    Equipment Utilized During Treatment Gait belt    Activity Tolerance Patient tolerated treatment well;Patient limited by fatigue    Behavior During Therapy [facility] for tasks assessed/performed           Past Medical History:  Diagnosis Date   Anemia of chronic disease 05/16/2019   Anxiety    h/o   COPD (chronic obstructive pulmonary disease) ([place])    Diabetes mellitus without complication ([place])    GERD (gastroesophageal reflux disease)    Hypertension    bp under control-off meds since 2019    Past Surgical History:  Procedure Laterality Date   ABDOMINAL HYSTERECTOMY     AMPUTATION Right 12/27/2018   Procedure: AMPUTATION BELOW KNEE;  Surgeon: Algernon Huxley, MD;  Location: [facility] ORS;  Service: General;  Laterality: Right;   CHOLECYSTECTOMY     COLONOSCOPY WITH PROPOFOL N/A 04/16/2019   Procedure: COLONOSCOPY WITH PROPOFOL;  Surgeon: Virgel Manifold, MD;  Location: [facility] ENDOSCOPY;  Service: Endoscopy;  Laterality: N/A;   ESOPHAGOGASTRODUODENOSCOPY (EGD) WITH PROPOFOL N/A 06/15/2018   Procedure: ESOPHAGOGASTRODUODENOSCOPY (EGD) WITH PROPOFOL;  Surgeon: Toledo, Benay Pike, MD;  Location: [facility] ENDOSCOPY;  Service: Gastroenterology;  Laterality: N/A;   ESOPHAGOGASTRODUODENOSCOPY (EGD) WITH PROPOFOL N/A 01/03/2019   Procedure: ESOPHAGOGASTRODUODENOSCOPY (EGD) WITH PROPOFOL;  Surgeon: Lucilla Lame, MD;   Location: [facility] ENDOSCOPY;  Service: Endoscopy;  Laterality: N/A;   GIVENS CAPSULE STUDY  04/16/2019   Procedure: GIVENS CAPSULE STUDY;  Surgeon: Virgel Manifold, MD;  Location: [facility] ENDOSCOPY;  Service: Endoscopy;;   GIVENS CAPSULE STUDY N/A 06/25/2019   Procedure: GIVENS CAPSULE STUDY;  Surgeon: Jonathon Bellows, MD;  Location: [facility] ENDOSCOPY;  Service: Gastroenterology;  Laterality: N/A;   LOWER EXTREMITY ANGIOGRAPHY Right 08/21/2018   Procedure: LOWER EXTREMITY ANGIOGRAPHY;  Surgeon: Algernon Huxley, MD;  Location: [place] CV LAB;  Service: Cardiovascular;  Laterality: Right;   LOWER EXTREMITY ANGIOGRAPHY Left 08/28/2018   Procedure: LOWER EXTREMITY ANGIOGRAPHY;  Surgeon: Algernon Huxley, MD;  Location: [place] [facility];  Service: Cardiovascular;  Laterality: Left;   LOWER EXTREMITY ANGIOGRAPHY Right 08/28/2018   Procedure: Lower Extremity Angiography;  Surgeon: Algernon Huxley, MD;  Location: [place] [facility];  Service: Cardiovascular;  Laterality: Right;   LOWER EXTREMITY ANGIOGRAPHY Right 12/18/2018   Procedure: Lower Extremity Angiography;  Surgeon: Algernon Huxley, MD;  Location: [place] [facility];  Service: Cardiovascular;  Laterality: Right;   LOWER EXTREMITY ANGIOGRAPHY Right 12/18/2018   Procedure: Lower Extremity Angiography;  Surgeon: Algernon Huxley, MD;  Location: [place] [facility];  Service: Cardiovascular;  Laterality: Right;   LOWER EXTREMITY ANGIOGRAPHY Left 12/21/2018   Procedure: Lower Extremity Angiography;  Surgeon: Algernon Huxley, MD;  Location: [place] [facility] LAB;  Service: Cardiovascular;  Laterality: Left;   LOWER EXTREMITY ANGIOGRAPHY Right 12/21/2018  Procedure: Lower Extremity Angiography;  Surgeon: Algernon Huxley, MD;  Location: [place] [facility];  Service: Cardiovascular;  Laterality: Right;   LOWER EXTREMITY INTERVENTION N/A 12/22/2018   Procedure: LOWER EXTREMITY INTERVENTION;  Surgeon: Algernon Huxley, MD;  Location: [place] [facility];  Service:  Cardiovascular;  Laterality: N/A;    There were no vitals filed for this visit.   Subjective Assessment - 10/31/19 1131    Subjective Pt. reports feeling sore after last session, but feeling good today. Transportation is still frustrating to her.    Pertinent History pt fell 6x before she got her prosthesis; 12/20/18 pt had below knee ampulation due to infection/gangrene    Limitations Sitting;Standing;Walking    How long can you sit comfortably? 1 hour    How long can you stand comfortably? 10 minutes (with prosthesis)    How long can you walk comfortably? A few minutes with some short rest breaks in standing    Patient Stated Goals to be able to do 7-8 steps to get in her home.  She has a ramp she can use now; to be able to walk again without AD.  She wants to be able to stand 1.5 hrs for cooking in her kitchen.    Currently in Pain? No/denies              There Ex:  Nustep L3 10 mins  Seated quad isometrics: 5x 10 sec hold R leg.   Neuro re-ed:   Walking on grass with SPC: 5 mins. Pt able to correct all LOB. Pt. demonstrated slightly decreased stance time and step length on R leg.   Ball tosses on airex to [employer]: initially ~3 feet away. Pt. started with large yellow ball with even stance. Pt. progressed weighted ball, then tosses in semi-tandem with R foot forward. Pt. then progressed to ~5 feet away with yellow ball. Pt. then instructed to have L foot on airex, R foot on ground. Pt. able to correct all LOB on airex. Pt. required CGA throughout.         PT Short Term Goals - 11/01/19 0459      PT SHORT TERM GOAL #1   Title Improve R knee extension AROM to 0 degrees to promote optimal gait mechanics with prosthesis    Baseline -10 degrees, 6/21: -2 degrees.   7/6: R quad lag -8 deg.    Time 4    Period Weeks    Status Partially Met    Target Date 11/20/19      PT SHORT TERM GOAL #2   Title Pt will be able to tolerate 20 min standing interventions without  requiring sitting rest break    Baseline 3 min, 6/21: able to perform with her FWW or parallel bars    Time 4    Period Weeks    Status Achieved    Target Date 10/23/19             PT Long Term Goals - 11/01/19 0841      PT LONG TERM GOAL #1   Title Pt will be able to ascend/descend 8 stairs independently with prosthesis    Baseline unable, 6/21: pt able to ascend 1 8-inch step in parallel bars with bilateral railing using L LE to ascend and R to descend; 10/23/19: able to ascend/descend 4 steps with reciprocal pattern, limited by residual limb pain/pressure    Time 4    Period Weeks    Status Partially Met  Target Date 11/20/19      PT LONG TERM GOAL #2   Title Pt will be able to stand x 1 hour wearing prosthesis with intermittent rest break for cooking full meal in her kitchen    Baseline 6/21: pt standing to cook but requires a chair to sit and cook from after 10-15 min.    Time 6    Period Weeks    Status Achieved    Target Date 11/20/19      PT LONG TERM GOAL #3   Title Improve FOTO score to >45/52 indicating improved overall activity tolerance    Baseline 31/52, 6/21: attempted to do FOTO but website not accessible- will attempt again at next visit; 10/23/2019: 33/52    Time 6    Period Weeks    Status Not Met    Target Date 11/20/19      PT LONG TERM GOAL #4   Title Pt will return to driving short distances (20-30 minutes) for independent community mobility for grocery shopping and doctor's appointments    Baseline 6/21: not driving. 5/86: pt. not driving at this time    Time 8    Period Weeks    Status On-going    Target Date 11/20/19              Plan - 11/01/19 0817    Clinical Impression Statement Pt. continues to progress ambulation with SPC in clinic and outdoors. Pt. demonstrates improved trunk rotation today with SPC. Pt. continues to improve correcting losses of balance with upper level static balance activities. With no AD, pt still experiences  frequent LOB and dimished quality of gait, demonstrating no trunk rotation, decreased step length on R, and increased stance time on L. Pt. will continue to benefit from skilled PT to improve independence and improve ability to complete ADLs.    Personal Factors and Comorbidities Comorbidity 2    Comorbidities see med hx    Examination-Activity Limitations Locomotion Level;Sit;Squat;Stand    Examination-Participation Restrictions Meal Prep;Community Activity;Driving    Stability/Clinical Decision Making Evolving/Moderate complexity    Clinical Decision Making Moderate    Rehab Potential Good    PT Frequency 2x / week    PT Duration 4 weeks    PT Treatment/Interventions ADLs/Self Care Home Management;Neuromuscular re-education;Balance training;Therapeutic exercise;Therapeutic activities;Functional mobility training;Stair training;Gait training;Patient/family education;Prosthetic Training;Manual techniques;Energy conservation;Passive range of motion    PT Next Visit Plan cont progressing LE strengthening, gt training, balance    PT Home Exercise Plan kept previous HEP, added sit to stand with emphasis on normal posture through trunk/pelvis/LE in standing 2x5    Consulted and Agree with Plan of Care Patient           Patient will benefit from skilled therapeutic intervention in order to improve the following deficits and impairments:  Abnormal gait, Decreased coordination, Decreased range of motion, Difficulty walking, Prosthetic Dependency, Decreased endurance, Decreased activity tolerance, Decreased knowledge of precautions, Impaired perceived functional ability, Pain, Decreased balance, Impaired flexibility, Postural dysfunction, Decreased strength, Decreased mobility  Visit Diagnosis: Below-knee amputation of right lower extremity (HCC)  Other abnormalities of gait and mobility  Muscle weakness (generalized)  Pain in right lower leg     Problem List Patient Active Problem List    Diagnosis Date Noted   Cocaine abuse ([place]) 07/26/2019   Diabetes mellitus without complication (HCC)    GERD (gastroesophageal reflux disease)    COPD (chronic obstructive pulmonary disease) (HCC)    GIB (gastrointestinal bleeding)  HLD (hyperlipidemia)    Depression    Elevated troponin    Syncope    Elevated troponin I level    Anemia of chronic disease 05/16/2019   Dizziness    Polyp of colon    Acute upper GI bleeding 04/13/2019   Anemia associated with acute blood loss 04/13/2019   Chronic anticoagulation 04/13/2019   Hx of right BKA ([place]) 04/13/2019   Symptomatic anemia 04/13/2019   Upper GI bleed 04/13/2019   Acute GI bleeding 04/13/2019   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 [place], [place], [address] Phone: [phone]   Fax:  [phone]  Name: Elli Groesbeck MRN: [id number] Date of Birth: 1956/04/01

## 2019-11-05 ENCOUNTER — Encounter: Payer: Self-pay | Admitting: Physical Therapy

## 2019-11-05 ENCOUNTER — Ambulatory Visit: Payer: Medicaid Other | Admitting: Physical Therapy

## 2019-11-05 ENCOUNTER — Other Ambulatory Visit: Payer: Self-pay

## 2019-11-05 DIAGNOSIS — M6281 Muscle weakness (generalized): Secondary | ICD-10-CM

## 2019-11-05 DIAGNOSIS — R2689 Other abnormalities of gait and mobility: Secondary | ICD-10-CM

## 2019-11-05 DIAGNOSIS — S88111A Complete traumatic amputation at level between knee and ankle, right lower leg, initial encounter: Secondary | ICD-10-CM | POA: Diagnosis not present

## 2019-11-05 DIAGNOSIS — M79661 Pain in right lower leg: Secondary | ICD-10-CM

## 2019-11-05 NOTE — Therapy (Signed)
[place] [facility] [address] [place], [place], [address] Phone: [phone]   Fax:  [phone]  Physical Therapy Treatment  Patient Details  Name: Sylvia [name] MRN: [id number] Date of Birth: Dec 09, 1955 No data recorded  Encounter Date: 11/05/2019   PT End of Session - 11/05/19 0938    Visit Number 13    Number of Visits 17    Date for PT Re-Evaluation 11/20/19    Authorization - Visit Number 5    Authorization - Number of Visits 10    PT Start Time 0935    PT Stop Time 1036    PT Time Calculation (min) 61 min    Equipment Utilized During Treatment Gait belt    Activity Tolerance Patient tolerated treatment well    Behavior During Therapy WFL for tasks assessed/performed           Past Medical History:  Diagnosis Date  . Anemia of chronic disease 05/16/2019  . Anxiety    h/o  . COPD (chronic obstructive pulmonary disease) ([place])   . Diabetes mellitus without complication ([place])   . GERD (gastroesophageal reflux disease)   . Hypertension    bp under control-off meds since 2019    Past Surgical History:  Procedure Laterality Date  . ABDOMINAL HYSTERECTOMY    . AMPUTATION Right 12/27/2018   Procedure: AMPUTATION BELOW KNEE;  Surgeon: Algernon Huxley, MD;  Location: [facility] ORS;  Service: General;  Laterality: Right;  . CHOLECYSTECTOMY    . COLONOSCOPY WITH PROPOFOL N/A 04/16/2019   Procedure: COLONOSCOPY WITH PROPOFOL;  Surgeon: Virgel Manifold, MD;  Location: [facility] ENDOSCOPY;  Service: Endoscopy;  Laterality: N/A;  . ESOPHAGOGASTRODUODENOSCOPY (EGD) WITH PROPOFOL N/A 06/15/2018   Procedure: ESOPHAGOGASTRODUODENOSCOPY (EGD) WITH PROPOFOL;  Surgeon: Toledo, Benay Pike, MD;  Location: [facility] ENDOSCOPY;  Service: Gastroenterology;  Laterality: N/A;  . ESOPHAGOGASTRODUODENOSCOPY (EGD) WITH PROPOFOL N/A 01/03/2019   Procedure: ESOPHAGOGASTRODUODENOSCOPY (EGD) WITH PROPOFOL;  Surgeon: Lucilla Lame, MD;  Location: [facility] ENDOSCOPY;   Service: Endoscopy;  Laterality: N/A;  . GIVENS CAPSULE STUDY  04/16/2019   Procedure: GIVENS CAPSULE STUDY;  Surgeon: Virgel Manifold, MD;  Location: [facility] ENDOSCOPY;  Service: Endoscopy;;  . GIVENS CAPSULE STUDY N/A 06/25/2019   Procedure: GIVENS CAPSULE STUDY;  Surgeon: Jonathon Bellows, MD;  Location: [facility] ENDOSCOPY;  Service: Gastroenterology;  Laterality: N/A;  . LOWER EXTREMITY ANGIOGRAPHY Right 08/21/2018   Procedure: LOWER EXTREMITY ANGIOGRAPHY;  Surgeon: Algernon Huxley, MD;  Location: [place] [facility];  Service: Cardiovascular;  Laterality: Right;  . LOWER EXTREMITY ANGIOGRAPHY Left 08/28/2018   Procedure: LOWER EXTREMITY ANGIOGRAPHY;  Surgeon: Algernon Huxley, MD;  Location: [place] [facility];  Service: Cardiovascular;  Laterality: Left;  . LOWER EXTREMITY ANGIOGRAPHY Right 08/28/2018   Procedure: Lower Extremity Angiography;  Surgeon: Algernon Huxley, MD;  Location: [place] [facility];  Service: Cardiovascular;  Laterality: Right;  . LOWER EXTREMITY ANGIOGRAPHY Right 12/18/2018   Procedure: Lower Extremity Angiography;  Surgeon: Algernon Huxley, MD;  Location: [place] [facility];  Service: Cardiovascular;  Laterality: Right;  . LOWER EXTREMITY ANGIOGRAPHY Right 12/18/2018   Procedure: Lower Extremity Angiography;  Surgeon: Algernon Huxley, MD;  Location: [place] [facility];  Service: Cardiovascular;  Laterality: Right;  . LOWER EXTREMITY ANGIOGRAPHY Left 12/21/2018   Procedure: Lower Extremity Angiography;  Surgeon: Algernon Huxley, MD;  Location: [place] [facility];  Service: Cardiovascular;  Laterality: Left;  . LOWER EXTREMITY ANGIOGRAPHY Right 12/21/2018   Procedure: Lower  Extremity Angiography;  Surgeon: Algernon Huxley, MD;  Location: [place] [facility];  Service: Cardiovascular;  Laterality: Right;  . LOWER EXTREMITY INTERVENTION N/A 12/22/2018   Procedure: LOWER EXTREMITY INTERVENTION;  Surgeon: Algernon Huxley, MD;  Location: [place] [facility];  Service: Cardiovascular;  Laterality: N/A;     There were no vitals filed for this visit.   Subjective Assessment - 11/05/19 0936    Subjective Pt. reports that she was able to go to a cookout this past weekend. Pt. reports she still is experiencing 7/10 phantom pain at night.    Pertinent History pt fell 6x before she got her prosthesis; 12/20/18 pt had below knee ampulation due to infection/gangrene    Limitations Sitting;Standing;Walking    How long can you sit comfortably? 1 hour    How long can you stand comfortably? 10 minutes (with prosthesis)    How long can you walk comfortably? A few minutes with some short rest breaks in standing    Patient Stated Goals to be able to do 7-8 steps to get in her home.  She has a ramp she can use now; to be able to walk again without AD.  She wants to be able to stand 1.5 hrs for cooking in her kitchen.    Currently in Pain? No/denies          There ex:  Nustep L4 10 mins  Prone on elbows hip flex stretch: 8 mins. Discussed HEP  Neuro re-ed:  Walking on blue mat with ankle weights under: Pt able to walk forward and backwards over mat. Pt. able to correct all LOB. Pt. required cueing to take even steps with both feet, was able to do so after cueing.   Ball tosses with rebounder on airex: pt initially with R leg to side off airex and L leg on airex with L2 weighted ball. Pt progressed to both feet on airex, more weight shifted to right side, with L3 weighted ball. Pt progressed to R foot behind and L foot on airex, and then R foot in front and L foot on airex.  Side stepping // bars: 5x     PT Education - 11/05/19 1021    Education Details See HEP    Person(s) Educated Patient    Methods Explanation;Demonstration;Handout    Comprehension Verbalized understanding;Returned demonstration            PT Short Term Goals - 11/01/19 0838      PT SHORT TERM GOAL #1   Title Improve R knee extension AROM to 0 degrees to promote optimal gait mechanics with prosthesis    Baseline -10  degrees, 6/21: -2 degrees.   7/6: R quad lag -8 deg.    Time 4    Period Weeks    Status Partially Met    Target Date 11/20/19      PT SHORT TERM GOAL #2   Title Pt will be able to tolerate 20 min standing interventions without requiring sitting rest break    Baseline 3 min, 6/21: able to perform with her FWW or parallel bars    Time 4    Period Weeks    Status Achieved    Target Date 10/23/19             PT Long Term Goals - 11/01/19 0841      PT LONG TERM GOAL #1   Title Pt will be able to ascend/descend 8 stairs independently with prosthesis    Baseline  unable, 6/21: pt able to ascend 1 8-inch step in parallel bars with bilateral railing using L LE to ascend and R to descend; 10/23/19: able to ascend/descend 4 steps with reciprocal pattern, limited by residual limb pain/pressure    Time 4    Period Weeks    Status Partially Met    Target Date 11/20/19      PT LONG TERM GOAL #2   Title Pt will be able to stand x 1 hour wearing prosthesis with intermittent rest break for cooking full meal in her kitchen    Baseline 6/21: pt standing to cook but requires a chair to sit and cook from after 10-15 min.    Time 6    Period Weeks    Status Achieved    Target Date 11/20/19      PT LONG TERM GOAL #3   Title Improve FOTO score to >45/52 indicating improved overall activity tolerance    Baseline 31/52, 6/21: attempted to do FOTO but website not accessible- will attempt again at next visit; 10/23/2019: 33/52    Time 6    Period Weeks    Status Not Met    Target Date 11/20/19      PT LONG TERM GOAL #4   Title Pt will return to driving short distances (20-30 minutes) for independent community mobility for grocery shopping and doctor's appointments    Baseline 6/21: not driving. 2/11: pt. not driving at this time    Time 8    Period Weeks    Status On-going    Target Date 11/20/19                 Plan - 11/05/19 1057    Clinical Impression Statement Pt. continues to  progress ambulation wiht SPC and with no AD in clinic. Pt. demonstrates improved ability to ambulate with no AD with even step length and step time on both feet. Pt. practiced different stances on airex with rebounder, pt able to correct all LOB with verbal cueing. Pt. demonstrates good ability to ambulate and react to unstable surfaces in clinic and able to correct all LOB. Pt. will continue to benefit from skilled PT to address balance concerns to decrease falls risk.    Personal Factors and Comorbidities Comorbidity 2    Comorbidities see med hx    Examination-Activity Limitations Locomotion Level;Sit;Squat;Stand    Examination-Participation Restrictions Meal Prep;Community Activity;Driving    Stability/Clinical Decision Making Evolving/Moderate complexity    Clinical Decision Making Moderate    Rehab Potential Good    PT Frequency 2x / week    PT Duration 4 weeks    PT Treatment/Interventions ADLs/Self Care Home Management;Neuromuscular re-education;Balance training;Therapeutic exercise;Therapeutic activities;Functional mobility training;Stair training;Gait training;Patient/family education;Prosthetic Training;Manual techniques;Energy conservation;Passive range of motion    PT Next Visit Plan cont progressing LE strengthening, gt training, balance    PT Home Exercise Plan [email or web address]    Consulted and Agree with Plan of Care Patient           Patient will benefit from skilled therapeutic intervention in order to improve the following deficits and impairments:  Abnormal gait, Decreased coordination, Decreased range of motion, Difficulty walking, Prosthetic Dependency, Decreased endurance, Decreased activity tolerance, Decreased knowledge of precautions, Impaired perceived functional ability, Pain, Decreased balance, Impaired flexibility, Postural dysfunction, Decreased strength, Decreased mobility  Visit Diagnosis: Below-knee amputation of right lower extremity (HCC)  Other abnormalities of  gait and mobility  Muscle weakness (generalized)  Pain in right lower leg  Problem List Patient Active Problem List   Diagnosis Date Noted  . Cocaine abuse ([place]) 07/26/2019  . Diabetes mellitus without complication ([place])   . GERD (gastroesophageal reflux disease)   . COPD (chronic obstructive pulmonary disease) ([place])   . GIB (gastrointestinal bleeding)   . HLD (hyperlipidemia)   . Depression   . Elevated troponin   . Syncope   . Elevated troponin I level   . Anemia of chronic disease 05/16/2019  . Dizziness   . Polyp of colon   . Acute upper GI bleeding 04/13/2019  . Anemia associated with acute blood loss 04/13/2019  . Chronic anticoagulation 04/13/2019  . Hx of right BKA ([place]) 04/13/2019  . Symptomatic anemia 04/13/2019  . Upper GI bleed 04/13/2019  . Acute GI bleeding 04/13/2019  . 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 Melena   . DU (duodenal ulcer)   . DKA (diabetic ketoacidoses) ([place]) 12/14/2018  . Atherosclerosis of artery of extremity with rest pain ([place]) 08/28/2018  . PAD (peripheral artery disease) ([place]) 07/14/2018  . Abdominal pain 06/13/2018  . HTN (hypertension) 06/13/2018  . Non-insulin dependent type 2 diabetes mellitus ([place]) 06/13/2018  . Elevated lactic acid level 06/13/2018   Pura Spice, PT, DPT # [id number] Carlyle Basques, SPT 11/05/2019, 11:54 AM  [place] [facility] [address] [place], [place], [address] Phone: [phone]   Fax:  [phone]  Name: Aleea Hendry MRN: [id number] Date of Birth: 1956-04-10

## 2019-11-05 NOTE — Patient Instructions (Signed)
Access Code: [email or web address]: [email or web address]: 07/19/2021Prepared by: Casimiro Needle SherkExercises  Single Leg Stance with Support - 1 x daily - 7 x weekly - 2 sets - 10 reps  Side Stepping with Counter Support - 1 x daily - 7 x weekly - 3 sets - 10 reps  Squat with Chair and Counter Support - 1 x daily - 7 x weekly - 3 sets - 10 reps

## 2019-11-07 ENCOUNTER — Other Ambulatory Visit
Admission: RE | Admit: 2019-11-07 | Discharge: 2019-11-07 | Disposition: A | Discharge status: Home / Self Care | Payer: Medicaid Other | Source: Ambulatory Visit | Attending: Gastroenterology | Admitting: Gastroenterology

## 2019-11-07 ENCOUNTER — Other Ambulatory Visit: Payer: Self-pay

## 2019-11-07 ENCOUNTER — Ambulatory Visit: Payer: Medicaid Other | Admitting: Physical Therapy

## 2019-11-07 ENCOUNTER — Encounter: Payer: Self-pay | Admitting: Physical Therapy

## 2019-11-07 DIAGNOSIS — S88111A Complete traumatic amputation at level between knee and ankle, right lower leg, initial encounter: Secondary | ICD-10-CM | POA: Diagnosis not present

## 2019-11-07 DIAGNOSIS — R2689 Other abnormalities of gait and mobility: Secondary | ICD-10-CM

## 2019-11-07 DIAGNOSIS — M6281 Muscle weakness (generalized): Secondary | ICD-10-CM

## 2019-11-07 DIAGNOSIS — Z01812 Encounter for preprocedural laboratory examination: Secondary | ICD-10-CM | POA: Insufficient documentation

## 2019-11-07 DIAGNOSIS — Z20822 Contact with and (suspected) exposure to covid-19: Secondary | ICD-10-CM | POA: Diagnosis not present

## 2019-11-07 DIAGNOSIS — M79661 Pain in right lower leg: Secondary | ICD-10-CM

## 2019-11-07 NOTE — Telephone Encounter (Signed)
Patient's colonoscopy had to be cancelled since we have not been able to get cardiac clearance from her PCP. Patient stated that she had an appointment with PCP on 11/23/2019. Patient was told that once we get cardiac clearance, we would reschedule her colonoscopy. Patient agreed and understood.

## 2019-11-07 NOTE — Therapy (Signed)
[facility] [address] [place], [place], [address] Phone: [phone]   Fax:  [phone]  Physical Therapy Treatment  Patient Details  Name: Sylvia [name] MRN: [id number] Date of Birth: January 30, 1956 No data recorded  Encounter Date: 11/07/2019   PT End of Session - 11/07/19 1000    Visit Number 14    Number of Visits 17    Date for PT Re-Evaluation 11/20/19    Authorization - Visit Number 6    Authorization - Number of Visits 10    PT Start Time 0948    PT Stop Time 1033    PT Time Calculation (min) 45 min    Equipment Utilized During Treatment Gait belt    Activity Tolerance Patient tolerated treatment well    Behavior During Therapy WFL for tasks assessed/performed           Past Medical History:  Diagnosis Date  . Anemia of chronic disease 05/16/2019  . Anxiety    h/o  . COPD (chronic obstructive pulmonary disease) (HCC)   . Diabetes mellitus without complication (HCC)   . GERD (gastroesophageal reflux disease)   . Hypertension    bp under control-off meds since 2019    Past Surgical History:  Procedure Laterality Date  . ABDOMINAL HYSTERECTOMY    . AMPUTATION Right 12/27/2018   Procedure: AMPUTATION BELOW KNEE;  Surgeon: Annice Needy, MD;  Location: [facility];  Service: General;  Laterality: Right;  . CHOLECYSTECTOMY    . COLONOSCOPY WITH PROPOFOL N/A 04/16/2019   Procedure: COLONOSCOPY WITH PROPOFOL;  Surgeon: Pasty Spillers, MD;  Location: [facility]OS[facility];  Service: Endoscopy;  Laterality: N/A;  . ESOPHAGOGASTRODUODENOSCOPY (EGD) WITH PROPOFOL N/A 06/15/2018   Procedure: ESOPHAGOGASTRODUODENOSCOPY (EGD) WITH PROPOFOL;  Surgeon: Toledo, Boykin Nearing, MD;  Location: [facility] ENDOS[facility];  Service: Gastroenterology;  Laterality: N/A;  . ESOPHAGOGASTRODUODENOSCOPY (EGD) WITH PROPOFOL N/A 01/03/2019   Procedure: ESOPHAGOGASTRODUODENOSCOPY (EGD) WITH PROPOFOL;  Surgeon: Midge Minium, MD;  Location: [facility];   Service: Endoscopy;  Laterality: N/A;  . GIVENS CAPSULE STUDY  04/16/2019   Procedure: GIVENS CAPSULE STUDY;  Surgeon: Pasty Spillers, MD;  Location: [facility];  Service: Endoscopy;;  . GIVENS CAPSULE STUDY N/A 06/25/2019   Procedure: GIVENS CAPSULE STUDY;  Surgeon: Wyline Mood, MD;  Location: [facility] ENDOSCOPY;  Service: Gastroenterology;  Laterality: N/A;  . LOWER EXTREMITY ANGIOGRAPHY Right 08/21/2018   Procedure: LOWER EXTREMITY ANGIOGRAPHY;  Surgeon: Annice Needy, MD;  Location: [facility];  Service: Cardiovascular;  Laterality: Right;  . LOWER EXTREMITY ANGIOGRAPHY Left 08/28/2018   Procedure: LOWER EXTREMITY ANGIOGRAPHY;  Surgeon: Annice Needy, MD;  Location: [facility];  Service: Cardiovascular;  Laterality: Left;  . LOWER EXTREMITY ANGIOGRAPHY Right 08/28/2018   Procedure: Lower Extremity Angiography;  Surgeon: Annice Needy, MD;  Location: [facility];  Service: Cardiovascular;  Laterality: Right;  . LOWER EXTREMITY ANGIOGRAPHY Right 12/18/2018   Procedure: Lower Extremity Angiography;  Surgeon: Annice Needy, MD;  Location: [facility];  Service: Cardiovascular;  Laterality: Right;  . LOWER EXTREMITY ANGIOGRAPHY Right 12/18/2018   Procedure: Lower Extremity Angiography;  Surgeon: Annice Needy, MD;  Location: [facility];  Service: Cardiovascular;  Laterality: Right;  . LOWER EXTREMITY ANGIOGRAPHY Left 12/21/2018   Procedure: Lower Extremity Angiography;  Surgeon: Annice Needy, MD;  Location: [facility];  Service: Cardiovascular;  Laterality: Left;  . LOWER EXTREMITY ANGIOGRAPHY Right 12/21/2018   Procedure: Lower  Extremity Angiography;  Surgeon: Algernon Huxley, MD;  Location: [name];  Service: Cardiovascular;  Laterality: Right;  . LOWER EXTREMITY INTERVENTION N/A 12/22/2018   Procedure: LOWER EXTREMITY INTERVENTION;  Surgeon: Algernon Huxley, MD;  Location: [place] [facility];  Service: Cardiovascular;  Laterality: N/A;     There were no vitals filed for this visit.   Subjective Assessment - 11/07/19 0958    Subjective Pt. reports no new pain in leg. Pt. feels that her balance is getting better and that she has been compliant wiht HEP.    Pertinent History pt fell 6x before she got her prosthesis; 12/20/18 pt had below knee ampulation due to infection/gangrene    Limitations Sitting;Standing;Walking    How long can you sit comfortably? 1 hour    How long can you stand comfortably? 10 minutes (with prosthesis)    How long can you walk comfortably? A few minutes with some short rest breaks in standing    Patient Stated Goals to be able to do 7-8 steps to get in her home.  She has a ramp she can use now; to be able to walk again without AD.  She wants to be able to stand 1.5 hrs for cooking in her kitchen.    Currently in Pain? No/denies              There Ex:   Nustep L4 10 mins: discuss HEP  Neuro re-ed:  Sitting "driving" activity: pink squishy soccer ball and circular air mat on floor, pt. sitting. Pt. instructed to pretend she's driving and use the items on floor as "pedals." PT gave "start/stop" instructions. Pt. able to react well to instructions, showing improvement on level of control with more reps. 10 mins  Walking outside: Pt. began with [facility] and progressed to no AD. Pt. walked on grass and unlevel terrain with CGA. Pt. able to self correct all LOB.  Step up 3" step and over: pt focusing on R quad control. 10x with R foot leading  Gait training:   Walking with no AD: pt. instructed to walk with no AD initially in // bars, progressed to around clinic and one length of the hallway. Pt. cued allow arm swing, spend even time on both legs, take even steps, and to increase gait speed. Pt. able to ambulate with consistent reciprocal gait pattern after cueing.        PT Short Term Goals - 11/01/19 8115      PT SHORT TERM GOAL #1   Title Improve R knee extension AROM to 0 degrees to promote  optimal gait mechanics with prosthesis    Baseline -10 degrees, 6/21: -2 degrees.   7/6: R quad lag -8 deg.    Time 4    Period Weeks    Status Partially Met    Target Date 11/20/19      PT SHORT TERM GOAL #2   Title Pt will be able to tolerate 20 min standing interventions without requiring sitting rest break    Baseline 3 min, 6/21: able to perform with her FWW or parallel bars    Time 4    Period Weeks    Status Achieved    Target Date 10/23/19             PT Long Term Goals - 11/01/19 0841      PT LONG TERM GOAL #1   Title Pt will be able to ascend/descend 8 stairs independently with prosthesis  Baseline unable, 6/21: pt able to ascend 1 8-inch step in parallel bars with bilateral railing using L LE to ascend and R to descend; 10/23/19: able to ascend/descend 4 steps with reciprocal pattern, limited by residual limb pain/pressure    Time 4    Period Weeks    Status Partially Met    Target Date 11/20/19      PT LONG TERM GOAL #2   Title Pt will be able to stand x 1 hour wearing prosthesis with intermittent rest break for cooking full meal in her kitchen    Baseline 6/21: pt standing to cook but requires a chair to sit and cook from after 10-15 min.    Time 6    Period Weeks    Status Achieved    Target Date 11/20/19      PT LONG TERM GOAL #3   Title Improve FOTO score to >45/52 indicating improved overall activity tolerance    Baseline 31/52, 6/21: attempted to do FOTO but website not accessible- will attempt again at next visit; 10/23/2019: 33/52    Time 6    Period Weeks    Status Not Met    Target Date 11/20/19      PT LONG TERM GOAL #4   Title Pt will return to driving short distances (20-30 minutes) for independent community mobility for grocery shopping and doctor's appointments    Baseline 6/21: not driving. 9/56: pt. not driving at this time    Time 8    Period Weeks    Status On-going    Target Date 11/20/19            Pt. continues to progress  driving type activities and demonstrates better reaction times with LE. Pt. able to ambulate outdoors today with no AD, requiring CGA throughout. Pt. able to maintain balance with hurdles in // bars. Pt. will continue to benefit from skilled PT to address balance deficits to decrease fall risk.    Patient will benefit from skilled therapeutic intervention in order to improve the following deficits and impairments:  Abnormal gait, Decreased coordination, Decreased range of motion, Difficulty walking, Prosthetic Dependency, Decreased endurance, Decreased activity tolerance, Decreased knowledge of precautions, Impaired perceived functional ability, Pain, Decreased balance, Impaired flexibility, Postural dysfunction, Decreased strength, Decreased mobility  Visit Diagnosis: Below-knee amputation of right lower extremity (HCC)  Other abnormalities of gait and mobility  Pain in right lower leg  Muscle weakness (generalized)     Problem List Patient Active Problem List   Diagnosis Date Noted  . Cocaine abuse ([place]) 07/26/2019  . Diabetes mellitus without complication ([place])   . GERD (gastroesophageal reflux disease)   . COPD (chronic obstructive pulmonary disease) ([place])   . GIB (gastrointestinal bleeding)   . HLD (hyperlipidemia)   . Depression   . Elevated troponin   . Syncope   . Elevated troponin I level   . Anemia of chronic disease 05/16/2019  . Dizziness   . Polyp of colon   . Acute upper GI bleeding 04/13/2019  . Anemia associated with acute blood loss 04/13/2019  . Chronic anticoagulation 04/13/2019  . Hx of right BKA ([place]) 04/13/2019  . Symptomatic anemia 04/13/2019  . Upper GI bleed 04/13/2019  . Acute GI bleeding 04/13/2019  . 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 Melena   . DU (duodenal ulcer)   . DKA (diabetic ketoacidoses) ([place]) 12/14/2018  . Atherosclerosis of artery of extremity with rest pain ([place]) 08/28/2018  .  PAD (peripheral  artery disease) ([place]) 07/14/2018  . Abdominal pain 06/13/2018  . HTN (hypertension) 06/13/2018  . Non-insulin dependent type 2 diabetes mellitus ([place]) 06/13/2018  . Elevated lactic acid level 06/13/2018   Pura Spice, PT, DPT # [id number] Carlyle Basques, SPT 11/08/2019, 9:16 AM  [place] [address] [place], [place], [address] Phone: [phone]   Fax:  [phone]  Name: Thirza Pellicano MRN: [id number] Date of Birth: [birth year]

## 2019-11-08 LAB — SARS CORONAVIRUS 2 (TAT 6-24 HRS): SARS Coronavirus 2: NEGATIVE

## 2019-11-09 ENCOUNTER — Ambulatory Visit: Admission: RE | Admit: 2019-11-09 | Payer: Medicaid Other | Source: Home / Self Care | Admitting: Gastroenterology

## 2019-11-09 ENCOUNTER — Encounter: Admission: RE | Payer: Self-pay | Source: Home / Self Care

## 2019-11-09 SURGERY — COLONOSCOPY WITH PROPOFOL
Anesthesia: General

## 2019-11-12 ENCOUNTER — Ambulatory Visit: Payer: Medicaid Other | Admitting: Physical Therapy

## 2019-11-12 ENCOUNTER — Other Ambulatory Visit: Payer: Self-pay

## 2019-11-12 ENCOUNTER — Encounter: Payer: Self-pay | Admitting: Physical Therapy

## 2019-11-12 DIAGNOSIS — S88111A Complete traumatic amputation at level between knee and ankle, right lower leg, initial encounter: Secondary | ICD-10-CM

## 2019-11-12 DIAGNOSIS — R2689 Other abnormalities of gait and mobility: Secondary | ICD-10-CM

## 2019-11-12 DIAGNOSIS — M79661 Pain in right lower leg: Secondary | ICD-10-CM

## 2019-11-12 DIAGNOSIS — M6281 Muscle weakness (generalized): Secondary | ICD-10-CM

## 2019-11-12 NOTE — Therapy (Signed)
[facility] [address] [place], [place], [address] Phone: [phone]   Fax:  [phone]  Physical Therapy Treatment  Patient Details  Name: Sylvia [name] MRN: [id number] Date of Birth: 02/17/1956 No data recorded  Encounter Date: 11/12/2019   PT End of Session - 11/12/19 0959    Visit Number 15    Number of Visits 17    Date for PT Re-Evaluation 11/20/19    Authorization - Visit Number 7    Authorization - Number of Visits 10    PT Start Time [phone]    PT Stop Time 1034    PT Time Calculation (min) 42 min    Equipment Utilized During Treatment Gait belt    Activity Tolerance Patient tolerated treatment well    Behavior During Therapy [facility] for tasks assessed/performed           Past Medical History:  Diagnosis Date   Anemia of chronic disease 05/16/2019   Anxiety    h/o   COPD (chronic obstructive pulmonary disease) (HCC)    Diabetes mellitus without complication (HCC)    GERD (gastroesophageal reflux disease)    Hypertension    bp under control-off meds since 2019    Past Surgical History:  Procedure Laterality Date   ABDOMINAL HYSTERECTOMY     AMPUTATION Right 12/27/2018   Procedure: AMPUTATION BELOW KNEE;  Surgeon: Annice Needy, MD;  Location: [facility];  Service: General;  Laterality: Right;   CHOLECYSTECTOMY     COLONOSCOPY WITH PROPOFOL N/A 04/16/2019   Procedure: COLONOSCOPY WITH PROPOFOL;  Surgeon: Pasty Spillers, MD;  Location: [facility];  Service: Endoscopy;  Laterality: N/A;   ESOPHAGOGASTRODUODENOSCOPY (EGD) WITH PROPOFOL N/A 06/15/2018   Procedure: ESOPHAGOGASTRODUODENOSCOPY (EGD) WITH PROPOFOL;  Surgeon: Toledo, Boykin Nearing, MD;  Location: [facility];  Service: Gastroenterology;  Laterality: N/A;   ESOPHAGOGASTRODUODENOSCOPY (EGD) WITH PROPOFOL N/A 01/03/2019   Procedure: ESOPHAGOGASTRODUODENOSCOPY (EGD) WITH PROPOFOL;  Surgeon: Midge Minium, MD;  Location: [facility];   Service: Endoscopy;  Laterality: N/A;   GIVENS CAPSULE STUDY  04/16/2019   Procedure: GIVENS CAPSULE STUDY;  Surgeon: Pasty Spillers, MD;  Location: [facility];  Service: Endoscopy;;   GIVENS CAPSULE STUDY N/A 06/25/2019   Procedure: GIVENS CAPSULE STUDY;  Surgeon: Wyline Mood, MD;  Location: [facility] ENDOSCOPY;  Service: Gastroenterology;  Laterality: N/A;   LOWER EXTREMITY ANGIOGRAPHY Right 08/21/2018   Procedure: LOWER EXTREMITY ANGIOGRAPHY;  Surgeon: Annice Needy, MD;  Location: [facility];  Service: Cardiovascular;  Laterality: Right;   LOWER EXTREMITY ANGIOGRAPHY Left 08/28/2018   Procedure: LOWER EXTREMITY ANGIOGRAPHY;  Surgeon: Annice Needy, MD;  Location: [facility];  Service: Cardiovascular;  Laterality: Left;   LOWER EXTREMITY ANGIOGRAPHY Right 08/28/2018   Procedure: Lower Extremity Angiography;  Surgeon: Annice Needy, MD;  Location: [facility];  Service: Cardiovascular;  Laterality: Right;   LOWER EXTREMITY ANGIOGRAPHY Right 12/18/2018   Procedure: Lower Extremity Angiography;  Surgeon: Annice Needy, MD;  Location: [facility];  Service: Cardiovascular;  Laterality: Right;   LOWER EXTREMITY ANGIOGRAPHY Right 12/18/2018   Procedure: Lower Extremity Angiography;  Surgeon: Annice Needy, MD;  Location: [facility];  Service: Cardiovascular;  Laterality: Right;   LOWER EXTREMITY ANGIOGRAPHY Left 12/21/2018   Procedure: Lower Extremity Angiography;  Surgeon: Annice Needy, MD;  Location: [facility];  Service: Cardiovascular;  Laterality: Left;   LOWER EXTREMITY ANGIOGRAPHY Right 12/21/2018   Procedure: Lower  Extremity Angiography;  Surgeon: Algernon Huxley, MD;  Location: [place] [facility];  Service: Cardiovascular;  Laterality: Right;   LOWER EXTREMITY INTERVENTION N/A 12/22/2018   Procedure: LOWER EXTREMITY INTERVENTION;  Surgeon: Algernon Huxley, MD;  Location: [place] [facility];  Service: Cardiovascular;  Laterality: N/A;     There were no vitals filed for this visit.   Subjective Assessment - 11/12/19 0958    Subjective Pt. reports having practiced driving this past weekend, and it went well. Pt. came to PT with rollator today. No trips/falls reported.    Pertinent History pt fell 6x before she got her prosthesis; 12/20/18 pt had below knee ampulation due to infection/gangrene    Limitations Sitting;Standing;Walking    How long can you sit comfortably? 1 hour    How long can you stand comfortably? 10 minutes (with prosthesis)    How long can you walk comfortably? A few minutes with some short rest breaks in standing    Patient Stated Goals to be able to do 7-8 steps to get in her home.  She has a ramp she can use now; to be able to walk again without AD.  She wants to be able to stand 1.5 hrs for cooking in her kitchen.    Currently in Pain? No/denies             There Ex:  Seated LAQ on R: 2x10  Nustep L3 10 mins  Neuro re-ed:  Star balance 3x around half star each leg. Pt demonstrates decreased balance on R LE compared to L LE. Pt required CGA, was able to self correct all LOB.   Walking outside: Pt with SPC around grassy terrain, uneven mulch, and uneven concrete. Pt required CGA and was able to correct all LOB.  10 mins       PT Short Term Goals - 11/01/19 7628      PT SHORT TERM GOAL #1   Title Improve R knee extension AROM to 0 degrees to promote optimal gait mechanics with prosthesis    Baseline -10 degrees, 6/21: -2 degrees.   7/6: R quad lag -8 deg.    Time 4    Period Weeks    Status Partially Met    Target Date 11/20/19      PT SHORT TERM GOAL #2   Title Pt will be able to tolerate 20 min standing interventions without requiring sitting rest break    Baseline 3 min, 6/21: able to perform with her FWW or parallel bars    Time 4    Period Weeks    Status Achieved    Target Date 10/23/19             PT Long Term Goals - 11/01/19 0841      PT LONG TERM GOAL #1    Title Pt will be able to ascend/descend 8 stairs independently with prosthesis    Baseline unable, 6/21: pt able to ascend 1 8-inch step in parallel bars with bilateral railing using L LE to ascend and R to descend; 10/23/19: able to ascend/descend 4 steps with reciprocal pattern, limited by residual limb pain/pressure    Time 4    Period Weeks    Status Partially Met    Target Date 11/20/19      PT LONG TERM GOAL #2   Title Pt will be able to stand x 1 hour wearing prosthesis with intermittent rest break for cooking full meal in her kitchen  Baseline 6/21: pt standing to cook but requires a chair to sit and cook from after 10-15 min.    Time 6    Period Weeks    Status Achieved    Target Date 11/20/19      PT LONG TERM GOAL #3   Title Improve FOTO score to >45/52 indicating improved overall activity tolerance    Baseline 31/52, 6/21: attempted to do FOTO but website not accessible- will attempt again at next visit; 10/23/2019: 33/52    Time 6    Period Weeks    Status Not Met    Target Date 11/20/19      PT LONG TERM GOAL #4   Title Pt will return to driving short distances (20-30 minutes) for independent community mobility for grocery shopping and doctor's appointments    Baseline 6/21: not driving. 5/27: pt. not driving at this time    Time 8    Period Weeks    Status On-going    Target Date 11/20/19                 Plan - 11/12/19 1255    Clinical Impression Statement Pt. continues to progress ambulation with SPC and with no AD. Pt. practiced walking on uneven terrain today and demonstrated improved balance outdoors. Pt. continues to progress static and dynamic balance, demonstrates more difficulty on the R compared ot L. Pt will continue to benefit from skilled PT to address balance deficits and to decrease falls risk.    Personal Factors and Comorbidities Comorbidity 2    Comorbidities see med hx    Examination-Activity Limitations Locomotion Level;Sit;Squat;Stand     Examination-Participation Restrictions Meal Prep;Community Activity;Driving    Stability/Clinical Decision Making Evolving/Moderate complexity    Clinical Decision Making Moderate    Rehab Potential Good    PT Frequency 2x / week    PT Duration 4 weeks    PT Treatment/Interventions ADLs/Self Care Home Management;Neuromuscular re-education;Balance training;Therapeutic exercise;Therapeutic activities;Functional mobility training;Stair training;Gait training;Patient/family education;Prosthetic Training;Manual techniques;Energy conservation;Passive range of motion    PT Next Visit Plan cont progressing LE strengthening, gt training, balance    PT Home Exercise Plan [id number]    Consulted and Agree with Plan of Care Patient           Patient will benefit from skilled therapeutic intervention in order to improve the following deficits and impairments:  Abnormal gait, Decreased coordination, Decreased range of motion, Difficulty walking, Prosthetic Dependency, Decreased endurance, Decreased activity tolerance, Decreased knowledge of precautions, Impaired perceived functional ability, Pain, Decreased balance, Impaired flexibility, Postural dysfunction, Decreased strength, Decreased mobility  Visit Diagnosis: Below-knee amputation of right lower extremity (HCC)  Other abnormalities of gait and mobility  Pain in right lower leg  Muscle weakness (generalized)     Problem List Patient Active Problem List   Diagnosis Date Noted   Cocaine abuse ([place]) 07/26/2019   Diabetes mellitus without complication (HCC)    GERD (gastroesophageal reflux disease)    COPD (chronic obstructive pulmonary disease) (HCC)    GIB (gastrointestinal bleeding)    HLD (hyperlipidemia)    Depression    Elevated troponin    Syncope    Elevated troponin I level    Anemia of chronic disease 05/16/2019   Dizziness    Polyp of colon    Acute upper GI bleeding 04/13/2019   Anemia associated with acute  blood loss 04/13/2019   Chronic anticoagulation 04/13/2019   Hx of right BKA ([place]) 04/13/2019   Symptomatic anemia 04/13/2019  Upper GI bleed 04/13/2019   Acute GI bleeding 04/13/2019   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 [place], [place], [address] Phone: [phone]   Fax:  [phone]  Name: Sylvia [name] MRN: [id number] Date of Birth: December 12, 1955

## 2019-11-14 ENCOUNTER — Encounter: Payer: Self-pay | Admitting: Physical Therapy

## 2019-11-14 ENCOUNTER — Other Ambulatory Visit: Payer: Self-pay

## 2019-11-14 ENCOUNTER — Ambulatory Visit: Payer: Medicaid Other

## 2019-11-14 DIAGNOSIS — S88111A Complete traumatic amputation at level between knee and ankle, right lower leg, initial encounter: Secondary | ICD-10-CM

## 2019-11-14 DIAGNOSIS — M6281 Muscle weakness (generalized): Secondary | ICD-10-CM

## 2019-11-14 DIAGNOSIS — R2689 Other abnormalities of gait and mobility: Secondary | ICD-10-CM

## 2019-11-14 DIAGNOSIS — M79661 Pain in right lower leg: Secondary | ICD-10-CM

## 2019-11-14 NOTE — Therapy (Signed)
[facility] [address] [place], [place], [address] Phone: [phone]   Fax:  [phone]  Physical Therapy Treatment  Patient Details  Name: Afia Messenger MRN: [id number] Date of Birth: 05-05-1955 No data recorded  Encounter Date: 11/14/2019   PT End of Session - 11/14/19 0952    Visit Number 16    Number of Visits 17    Date for PT Re-Evaluation 11/20/19    Authorization - Visit Number 8    Authorization - Number of Visits 10    PT Start Time [place] During Treatment Gait belt    Activity Tolerance Patient tolerated treatment well    Behavior During Therapy [facility] for tasks assessed/performed           Past Medical History:  Diagnosis Date  . Anemia of chronic disease 05/16/2019  . Anxiety    h/o  . COPD (chronic obstructive pulmonary disease) ([place])   . Diabetes mellitus without complication ([place])   . GERD (gastroesophageal reflux disease)   . Hypertension    bp under control-off meds since 2019    Past Surgical History:  Procedure Laterality Date  . ABDOMINAL HYSTERECTOMY    . AMPUTATION Right 12/27/2018   Procedure: AMPUTATION BELOW KNEE;  Surgeon: Algernon Huxley, MD;  Location: [facility] ORS;  Service: General;  Laterality: Right;  . CHOLECYSTECTOMY    . COLONOSCOPY WITH PROPOFOL N/A 04/16/2019   Procedure: COLONOSCOPY WITH PROPOFOL;  Surgeon: Virgel Manifold, MD;  Location: [facility] ENDOSCOPY;  Service: Endoscopy;  Laterality: N/A;  . ESOPHAGOGASTRODUODENOSCOPY (EGD) WITH PROPOFOL N/A 06/15/2018   Procedure: ESOPHAGOGASTRODUODENOSCOPY (EGD) WITH PROPOFOL;  Surgeon: Toledo, Benay Pike, MD;  Location: [facility] ENDOSCOPY;  Service: Gastroenterology;  Laterality: N/A;  . ESOPHAGOGASTRODUODENOSCOPY (EGD) WITH PROPOFOL N/A 01/03/2019   Procedure: ESOPHAGOGASTRODUODENOSCOPY (EGD) WITH PROPOFOL;  Surgeon: Lucilla Lame, MD;  Location: [facility] ENDOSCOPY;  Service: Endoscopy;  Laterality: N/A;  . GIVENS CAPSULE STUDY   04/16/2019   Procedure: GIVENS CAPSULE STUDY;  Surgeon: Virgel Manifold, MD;  Location: [facility] ENDOSCOPY;  Service: Endoscopy;;  . GIVENS CAPSULE STUDY N/A 06/25/2019   Procedure: GIVENS CAPSULE STUDY;  Surgeon: Jonathon Bellows, MD;  Location: [facility] ENDOSCOPY;  Service: Gastroenterology;  Laterality: N/A;  . LOWER EXTREMITY ANGIOGRAPHY Right 08/21/2018   Procedure: LOWER EXTREMITY ANGIOGRAPHY;  Surgeon: Algernon Huxley, MD;  Location: [place] CV LAB;  Service: Cardiovascular;  Laterality: Right;  . LOWER EXTREMITY ANGIOGRAPHY Left 08/28/2018   Procedure: LOWER EXTREMITY ANGIOGRAPHY;  Surgeon: Algernon Huxley, MD;  Location: [place] CV LAB;  Service: Cardiovascular;  Laterality: Left;  . LOWER EXTREMITY ANGIOGRAPHY Right 08/28/2018   Procedure: Lower Extremity Angiography;  Surgeon: Algernon Huxley, MD;  Location: [place] CV LAB;  Service: Cardiovascular;  Laterality: Right;  . LOWER EXTREMITY ANGIOGRAPHY Right 12/18/2018   Procedure: Lower Extremity Angiography;  Surgeon: Algernon Huxley, MD;  Location: [place] CV LAB;  Service: Cardiovascular;  Laterality: Right;  . LOWER EXTREMITY ANGIOGRAPHY Right 12/18/2018   Procedure: Lower Extremity Angiography;  Surgeon: Algernon Huxley, MD;  Location: [place] CV LAB;  Service: Cardiovascular;  Laterality: Right;  . LOWER EXTREMITY ANGIOGRAPHY Left 12/21/2018   Procedure: Lower Extremity Angiography;  Surgeon: Algernon Huxley, MD;  Location: [place] CV LAB;  Service: Cardiovascular;  Laterality: Left;  . LOWER EXTREMITY ANGIOGRAPHY Right 12/21/2018   Procedure: Lower Extremity Angiography;  Surgeon: Algernon Huxley, MD;  Location: [place] CV LAB;  Service:  Cardiovascular;  Laterality: Right;  . LOWER EXTREMITY INTERVENTION N/A 12/22/2018   Procedure: LOWER EXTREMITY INTERVENTION;  Surgeon: Algernon Huxley, MD;  Location: [place] CV LAB;  Service: Cardiovascular;  Laterality: N/A;    There were no vitals filed for this visit.   Subjective  Assessment - 11/14/19 0951    Subjective Pt. reports that she practiced driving again and did much better. Pt. came to PT with walker today.    Pertinent History pt fell 6x before she got her prosthesis; 12/20/18 pt had below knee ampulation due to infection/gangrene    Limitations Sitting;Standing;Walking    How long can you sit comfortably? 1 hour    How long can you stand comfortably? 10 minutes (with prosthesis)    How long can you walk comfortably? A few minutes with some short rest breaks in standing    Patient Stated Goals to be able to do 7-8 steps to get in her home.  She has a ramp she can use now; to be able to walk again without AD.  She wants to be able to stand 1.5 hrs for cooking in her kitchen.    Currently in Pain? No/denies             There Ex:  Nustep L3 10 mins  Stairs: 2x. Pt able to ascend 1x with reciprocal pattern and one hand on hand rail. Pt descended with step to pattern. Pt required supervision to complete.   Neuro Re-ed:  Walking outdoors: pt able to walk on uneven terrain with inclines today with SPC. Pt able to self correct all LOB with CGA from PT.   Kicking ball with L LE: pt required cueing to maintain weight shifting on R LE. Pt progressed to playing "bowling" with kicking ball into triangle of cones. Pt practiced reaching down to place ball to kick, pt was able to maintain balance throughout.  Ball tosses on airex: Pt required cueing to maintain R weightshift when standing on airex. Pt able to catch ball outside outside BOS in all directions. Pt had no LOB and was able to maintain balance with CGA     30 min 1:1 billable tx today     PT Short Term Goals - 11/01/19 0940      PT SHORT TERM GOAL #1   Title Improve R knee extension AROM to 0 degrees to promote optimal gait mechanics with prosthesis    Baseline -10 degrees, 6/21: -2 degrees.   7/6: R quad lag -8 deg.    Time 4    Period Weeks    Status Partially Met    Target Date 11/20/19       PT SHORT TERM GOAL #2   Title Pt will be able to tolerate 20 min standing interventions without requiring sitting rest break    Baseline 3 min, 6/21: able to perform with her FWW or parallel bars    Time 4    Period Weeks    Status Achieved    Target Date 10/23/19             PT Long Term Goals - 11/01/19 0841      PT LONG TERM GOAL #1   Title Pt will be able to ascend/descend 8 stairs independently with prosthesis    Baseline unable, 6/21: pt able to ascend 1 8-inch step in parallel bars with bilateral railing using L LE to ascend and R to descend; 10/23/19: able to ascend/descend 4 steps with reciprocal pattern, limited by  residual limb pain/pressure    Time 4    Period Weeks    Status Partially Met    Target Date 11/20/19      PT LONG TERM GOAL #2   Title Pt will be able to stand x 1 hour wearing prosthesis with intermittent rest break for cooking full meal in her kitchen    Baseline 6/21: pt standing to cook but requires a chair to sit and cook from after 10-15 min.    Time 6    Period Weeks    Status Achieved    Target Date 11/20/19      PT LONG TERM GOAL #3   Title Improve FOTO score to >45/52 indicating improved overall activity tolerance    Baseline 31/52, 6/21: attempted to do FOTO but website not accessible- will attempt again at next visit; 10/23/2019: 33/52    Time 6    Period Weeks    Status Not Met    Target Date 11/20/19      PT LONG TERM GOAL #4   Title Pt will return to driving short distances (20-30 minutes) for independent community mobility for grocery shopping and doctor's appointments    Baseline 6/21: not driving. 4/88: pt. not driving at this time    Time 8    Period Weeks    Status On-going    Target Date 11/20/19                  Patient will benefit from skilled therapeutic intervention in order to improve the following deficits and impairments:     Visit Diagnosis: Below-knee amputation of right lower extremity ([place])  Other  abnormalities of gait and mobility  Pain in right lower leg  Muscle weakness (generalized)     Problem List Patient Active Problem List   Diagnosis Date Noted  . Cocaine abuse ([place]) 07/26/2019  . Diabetes mellitus without complication ([place])   . GERD (gastroesophageal reflux disease)   . COPD (chronic obstructive pulmonary disease) ([place])   . GIB (gastrointestinal bleeding)   . HLD (hyperlipidemia)   . Depression   . Elevated troponin   . Syncope   . Elevated troponin I level   . Anemia of chronic disease 05/16/2019  . Dizziness   . Polyp of colon   . Acute upper GI bleeding 04/13/2019  . Anemia associated with acute blood loss 04/13/2019  . Chronic anticoagulation 04/13/2019  . Hx of right BKA ([place]) 04/13/2019  . Symptomatic anemia 04/13/2019  . Upper GI bleed 04/13/2019  . Acute GI bleeding 04/13/2019  . 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 Melena   . DU (duodenal ulcer)   . DKA (diabetic ketoacidoses) ([place]) 12/14/2018  . Atherosclerosis of artery of extremity with rest pain ([place]) 08/28/2018  . PAD (peripheral artery disease) ([place]) 07/14/2018  . Abdominal pain 06/13/2018  . HTN (hypertension) 06/13/2018  . Non-insulin dependent type 2 diabetes mellitus ([place]) 06/13/2018  . Elevated lactic acid level 06/13/2018    Carlyle Basques, SPT 11/14/2019, 9:53 AM Merdis Delay, PT, DPT    [facility] [address] [place], [place], [address] Phone: [phone]   Fax:  [phone]  Name: Bodhi Stenglein MRN: [id number] Date of Birth: 1956-03-10

## 2019-11-15 NOTE — Telephone Encounter (Signed)
Patient's PCP sent [place] a letter letting us know that the patient was not able to get clearance until she schedules an appointment with her.

## 2019-11-19 ENCOUNTER — Ambulatory Visit: Payer: Medicaid Other | Admitting: Physical Therapy

## 2019-11-21 ENCOUNTER — Other Ambulatory Visit: Payer: Self-pay

## 2019-11-21 ENCOUNTER — Encounter: Payer: Self-pay | Admitting: Physical Therapy

## 2019-11-21 ENCOUNTER — Ambulatory Visit: Payer: Medicaid Other | Attending: Vascular Surgery | Admitting: Physical Therapy

## 2019-11-21 DIAGNOSIS — M79661 Pain in right lower leg: Secondary | ICD-10-CM

## 2019-11-21 DIAGNOSIS — M6281 Muscle weakness (generalized): Secondary | ICD-10-CM

## 2019-11-21 DIAGNOSIS — R2689 Other abnormalities of gait and mobility: Secondary | ICD-10-CM | POA: Diagnosis not present

## 2019-11-21 DIAGNOSIS — S88111A Complete traumatic amputation at level between knee and ankle, right lower leg, initial encounter: Secondary | ICD-10-CM | POA: Insufficient documentation

## 2019-11-21 NOTE — Therapy (Signed)
[facility] [address] [place], [place], [address] Phone: [phone]   Fax:  [phone]  Physical Therapy Treatment  Patient Details  Name: Sylvia [name] MRN: [id number] Date of Birth: 1956-02-26 No data recorded  Encounter Date: 11/21/2019   PT End of Session - 11/21/19 0947    Visit Number 17    Number of Visits 25    Date for PT Re-Evaluation 12/19/19    Authorization - Visit Number 1    Authorization - Number of Visits 10    PT Start Time 0941    PT Stop Time 1030    PT Time Calculation (min) 49 min    Equipment Utilized During Treatment Gait belt    Activity Tolerance Patient tolerated treatment well    Behavior During Therapy WFL for tasks assessed/performed           Past Medical History:  Diagnosis Date  . Anemia of chronic disease 05/16/2019  . Anxiety    h/o  . COPD (chronic obstructive pulmonary disease) ([place])   . Diabetes mellitus without complication ([place])   . GERD (gastroesophageal reflux disease)   . Hypertension    bp under control-off meds since 2019    Past Surgical History:  Procedure Laterality Date  . ABDOMINAL HYSTERECTOMY    . AMPUTATION Right 12/27/2018   Procedure: AMPUTATION BELOW KNEE;  Surgeon: Algernon Huxley, MD;  Location: [facility] ORS;  Service: General;  Laterality: Right;  . CHOLECYSTECTOMY    . COLONOSCOPY WITH PROPOFOL N/A 04/16/2019   Procedure: COLONOSCOPY WITH PROPOFOL;  Surgeon: Virgel Manifold, MD;  Location: [facility] ENDOSCOPY;  Service: Endoscopy;  Laterality: N/A;  . ESOPHAGOGASTRODUODENOSCOPY (EGD) WITH PROPOFOL N/A 06/15/2018   Procedure: ESOPHAGOGASTRODUODENOSCOPY (EGD) WITH PROPOFOL;  Surgeon: Toledo, Benay Pike, MD;  Location: [facility] ENDOSCOPY;  Service: Gastroenterology;  Laterality: N/A;  . ESOPHAGOGASTRODUODENOSCOPY (EGD) WITH PROPOFOL N/A 01/03/2019   Procedure: ESOPHAGOGASTRODUODENOSCOPY (EGD) WITH PROPOFOL;  Surgeon: Lucilla Lame, MD;  Location: [facility] ENDOSCOPY;   Service: Endoscopy;  Laterality: N/A;  . GIVENS CAPSULE STUDY  04/16/2019   Procedure: GIVENS CAPSULE STUDY;  Surgeon: Virgel Manifold, MD;  Location: [facility] ENDOSCOPY;  Service: Endoscopy;;  . GIVENS CAPSULE STUDY N/A 06/25/2019   Procedure: GIVENS CAPSULE STUDY;  Surgeon: Jonathon Bellows, MD;  Location: [facility] ENDOSCOPY;  Service: Gastroenterology;  Laterality: N/A;  . LOWER EXTREMITY ANGIOGRAPHY Right 08/21/2018   Procedure: LOWER EXTREMITY ANGIOGRAPHY;  Surgeon: Algernon Huxley, MD;  Location: [place] CV LAB;  Service: Cardiovascular;  Laterality: Right;  . LOWER EXTREMITY ANGIOGRAPHY Left 08/28/2018   Procedure: LOWER EXTREMITY ANGIOGRAPHY;  Surgeon: Algernon Huxley, MD;  Location: [place] CV LAB;  Service: Cardiovascular;  Laterality: Left;  . LOWER EXTREMITY ANGIOGRAPHY Right 08/28/2018   Procedure: Lower Extremity Angiography;  Surgeon: Algernon Huxley, MD;  Location: [place] CV LAB;  Service: Cardiovascular;  Laterality: Right;  . LOWER EXTREMITY ANGIOGRAPHY Right 12/18/2018   Procedure: Lower Extremity Angiography;  Surgeon: Algernon Huxley, MD;  Location: [place] CV LAB;  Service: Cardiovascular;  Laterality: Right;  . LOWER EXTREMITY ANGIOGRAPHY Right 12/18/2018   Procedure: Lower Extremity Angiography;  Surgeon: Algernon Huxley, MD;  Location: [place] CV LAB;  Service: Cardiovascular;  Laterality: Right;  . LOWER EXTREMITY ANGIOGRAPHY Left 12/21/2018   Procedure: Lower Extremity Angiography;  Surgeon: Algernon Huxley, MD;  Location: [place] [facility] LAB;  Service: Cardiovascular;  Laterality: Left;  . LOWER EXTREMITY ANGIOGRAPHY Right 12/21/2018   Procedure: Lower  Extremity Angiography;  Surgeon: Algernon Huxley, MD;  Location: [place] [facility];  Service: Cardiovascular;  Laterality: Right;  . LOWER EXTREMITY INTERVENTION N/A 12/22/2018   Procedure: LOWER EXTREMITY INTERVENTION;  Surgeon: Algernon Huxley, MD;  Location: [place] [facility] LAB;  Service: Cardiovascular;  Laterality: N/A;     There were no vitals filed for this visit.   Subjective Assessment - 11/21/19 1001    Subjective Pt. reports that she went to [place] this past weekend, and states she completed 5 stairs with cane and hand held assist.    Pertinent History pt fell 6x before she got her prosthesis; 12/20/18 pt had below knee ampulation due to infection/gangrene    Limitations Sitting;Standing;Walking    How long can you sit comfortably? 1 hour    How long can you stand comfortably? 10 minutes (with prosthesis)    How long can you walk comfortably? A few minutes with some short rest breaks in standing    Patient Stated Goals to be able to do 7-8 steps to get in her home.  She has a ramp she can use now; to be able to walk again without AD.  She wants to be able to stand 1.5 hrs for cooking in her kitchen.    Currently in Pain? No/denies           Neuro Re-ed:  Outdoor walking ~150 feet down and up hills, uneven terrain: pt cued for more control when walking downhill. Pt cued to increase L step length.   Stairs outdoors: pt with one handrail and cane. Pt able to descend with step to pattern, and ascend with reciprocal pattern. Pt requires supervision with step to pattern, CGA with reciprocal pattern.   Picking up various ankle weights (0.5-1lb) off ground: pt instructed to pick up weights off ground with squat technique. Pt able to reach outside BOS to pick them up with good form. Pt required supervision to complete.  There Ex:  Nustep 10 mins: pt completed FOTO with PT assist. Pt completed LE only L4 for 5 mins, L2 for 5 mins.   Discussed HEP       PT Short Term Goals - 11/21/19 1419      PT SHORT TERM GOAL #1   Title Improve R knee extension AROM to 0 degrees to promote optimal gait mechanics with prosthesis    Baseline -10 degrees, 6/21: -2 degrees.   7/6: R quad lag -8 deg.  8/4: 0 deg. noted during gait.    Time 4    Period Weeks    Status Achieved    Target Date 11/21/19       PT SHORT TERM GOAL #2   Title Pt will be able to tolerate 20 min standing interventions without requiring sitting rest break    Baseline 3 min, 6/21: able to perform with her FWW or parallel bars    Time 4    Period Weeks    Status Achieved    Target Date 10/23/19             PT Long Term Goals - 11/21/19 0945      PT LONG TERM GOAL #1   Title Pt will be able to ascend/descend 8 stairs independently with prosthesis with reciprocal pattern.    Baseline unable, 6/21: pt able to ascend 1 8-inch step in parallel bars with bilateral railing using L LE to ascend and R to descend; 10/23/19: able to ascend/descend 4 steps with reciprocal pattern, limited  by residual limb pain/pressure.; 11/20/19: pt able to ascend/descend 8 stairs with half reciprocal pattern. safe step to pattern with cane    Time 4    Period Weeks    Status Revised    Target Date 12/19/19      PT LONG TERM GOAL #2   Title Pt will be able to stand x 1 hour wearing prosthesis with no rest break for cooking full meal in her kitchen    Baseline 6/21: pt standing to cook but requires a chair to sit and cook from after 10-15 min.; 8/3: pt able to stand for about 30 mins, limited by pain    Time 6    Period Weeks    Status Revised    Target Date 12/19/19      PT LONG TERM GOAL #3   Title Improve FOTO score to >45/52 indicating improved overall activity tolerance    Baseline 31/52, 6/21: attempted to do FOTO but website not accessible- will attempt again at next visit; 10/23/2019: 33/52; 8/3:59/52    Time 6    Period Weeks    Status Achieved    Target Date 11/21/19      PT LONG TERM GOAL #4   Title Pt will return to driving short distances (20-30 minutes) for independent community mobility for grocery shopping and doctor's appointments    Baseline 6/21: not driving. 3/57: pt. not driving at this time; 8/3: pt is able to now drive around a parking lot as long as she's wearing lighter weight sneakers.    Time 4    Period Weeks     Status On-going    Target Date 12/19/19      PT LONG TERM GOAL #5   Title Pt. will be able to walk at least 150 feet with no AD with supervision to improve independence.    Baseline 8/3: pt can walk about 150 feet with CGA and SPC    Time 4    Period Weeks    Status New    Target Date 12/19/19                 Plan - 11/21/19 1422    Clinical Impression Statement Pt. demonstrates improved ability to ambulate wtih SPC outdoors today. Pt. has met most of her goals today, demonstrating improved standing tolerance and improved independence with ADLs such as picking things up off floor and maintaining balance when reaching outside BOS. Pt. continues to demonstrate decreased balance when ambulating without AD, particularly when outdoors. Pt. still reports increased phantom pain and increased pain with increased standing time. Pt. reports that she is beginning to practice driving in parking lots. Pt. continues to show improvements with completing stairs, still requires either cane or railings to complete reciprocal gait pattern. Pt. will continue to benefit from skilled PT to improve independent ambulation, decrease falls risk, and improve ability to complete ADLs independently.    Personal Factors and Comorbidities Comorbidity 2    Comorbidities see med hx    Examination-Activity Limitations Locomotion Level;Sit;Squat;Stand    Examination-Participation Restrictions Meal Prep;Community Activity;Driving    Stability/Clinical Decision Making Evolving/Moderate complexity    Clinical Decision Making Moderate    Rehab Potential Good    PT Frequency 2x / week    PT Duration 4 weeks    PT Treatment/Interventions ADLs/Self Care Home Management;Neuromuscular re-education;Balance training;Therapeutic exercise;Therapeutic activities;Functional mobility training;Stair training;Gait training;Patient/family education;Prosthetic Training;Manual techniques;Energy conservation;Passive range of motion    PT  Next Visit Plan cont progressing LE  strengthening, gt training, balance    PT Home Exercise Plan [id number]    Consulted and Agree with Plan of Care Patient           Patient will benefit from skilled therapeutic intervention in order to improve the following deficits and impairments:  Abnormal gait, Decreased coordination, Decreased range of motion, Difficulty walking, Prosthetic Dependency, Decreased endurance, Decreased activity tolerance, Decreased knowledge of precautions, Impaired perceived functional ability, Pain, Decreased balance, Impaired flexibility, Postural dysfunction, Decreased strength, Decreased mobility  Visit Diagnosis: Other abnormalities of gait and mobility  Pain in right lower leg  Muscle weakness (generalized)  Below-knee amputation of right lower extremity ([place])     Problem List Patient Active Problem List   Diagnosis Date Noted  . Cocaine abuse ([place]) 07/26/2019  . Diabetes mellitus without complication ([place])   . GERD (gastroesophageal reflux disease)   . COPD (chronic obstructive pulmonary disease) ([place])   . GIB (gastrointestinal bleeding)   . HLD (hyperlipidemia)   . Depression   . Elevated troponin   . Syncope   . Elevated troponin I level   . Anemia of chronic disease 05/16/2019  . Dizziness   . Polyp of colon   . Acute upper GI bleeding 04/13/2019  . Anemia associated with acute blood loss 04/13/2019  . Chronic anticoagulation 04/13/2019  . Hx of right BKA ([place]) 04/13/2019  . Symptomatic anemia 04/13/2019  . Upper GI bleed 04/13/2019  . Acute GI bleeding 04/13/2019  . 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 Melena   . DU (duodenal ulcer)   . DKA (diabetic ketoacidoses) ([place]) 12/14/2018  . Atherosclerosis of artery of extremity with rest pain ([place]) 08/28/2018  . PAD (peripheral artery disease) ([place]) 07/14/2018  . Abdominal pain 06/13/2018  . HTN (hypertension) 06/13/2018  . Non-insulin dependent type 2  diabetes mellitus ([place]) 06/13/2018  . Elevated lactic acid level 06/13/2018   Pura Spice, PT, DPT # [id number] Carlyle Basques, SPT 11/22/2019, 8:28 AM  [facility] [address] [place], [place], [address] Phone: [phone]   Fax:  [phone]  Name: Sylvia [name] MRN: [id number] Date of Birth: March 27, 1956

## 2019-11-23 ENCOUNTER — Inpatient Hospital Stay: Payer: Medicaid Other | Attending: Oncology | Admitting: Oncology

## 2019-11-23 ENCOUNTER — Other Ambulatory Visit: Payer: Self-pay

## 2019-11-23 ENCOUNTER — Inpatient Hospital Stay: Payer: Medicaid Other

## 2019-11-23 ENCOUNTER — Encounter: Payer: Self-pay | Admitting: Oncology

## 2019-11-23 VITALS — BP 167/78 | HR 108 | Temp 97.3°F | Resp 16 | Wt 144.0 lb

## 2019-11-23 DIAGNOSIS — Z87891 Personal history of nicotine dependence: Secondary | ICD-10-CM | POA: Diagnosis not present

## 2019-11-23 DIAGNOSIS — Z9049 Acquired absence of other specified parts of digestive tract: Secondary | ICD-10-CM | POA: Diagnosis not present

## 2019-11-23 DIAGNOSIS — I10 Essential (primary) hypertension: Secondary | ICD-10-CM | POA: Insufficient documentation

## 2019-11-23 DIAGNOSIS — Z7984 Long term (current) use of oral hypoglycemic drugs: Secondary | ICD-10-CM | POA: Insufficient documentation

## 2019-11-23 DIAGNOSIS — D649 Anemia, unspecified: Secondary | ICD-10-CM

## 2019-11-23 DIAGNOSIS — Z89511 Acquired absence of right leg below knee: Secondary | ICD-10-CM | POA: Insufficient documentation

## 2019-11-23 DIAGNOSIS — Z8719 Personal history of other diseases of the digestive system: Secondary | ICD-10-CM

## 2019-11-23 DIAGNOSIS — K219 Gastro-esophageal reflux disease without esophagitis: Secondary | ICD-10-CM | POA: Diagnosis not present

## 2019-11-23 DIAGNOSIS — E119 Type 2 diabetes mellitus without complications: Secondary | ICD-10-CM | POA: Diagnosis not present

## 2019-11-23 DIAGNOSIS — F419 Anxiety disorder, unspecified: Secondary | ICD-10-CM | POA: Insufficient documentation

## 2019-11-23 DIAGNOSIS — Z803 Family history of malignant neoplasm of breast: Secondary | ICD-10-CM | POA: Diagnosis not present

## 2019-11-23 DIAGNOSIS — Z79899 Other long term (current) drug therapy: Secondary | ICD-10-CM | POA: Insufficient documentation

## 2019-11-23 DIAGNOSIS — Z7901 Long term (current) use of anticoagulants: Secondary | ICD-10-CM | POA: Insufficient documentation

## 2019-11-23 DIAGNOSIS — R700 Elevated erythrocyte sedimentation rate: Secondary | ICD-10-CM | POA: Diagnosis not present

## 2019-11-23 DIAGNOSIS — J449 Chronic obstructive pulmonary disease, unspecified: Secondary | ICD-10-CM | POA: Diagnosis not present

## 2019-11-23 LAB — COMPREHENSIVE METABOLIC PANEL
ALT: 13 U/L (ref 0–44)
AST: 16 U/L (ref 15–41)
Albumin: 4.1 g/dL (ref 3.5–5.0)
Alkaline Phosphatase: 103 U/L (ref 38–126)
Anion gap: 9 (ref 5–15)
BUN: 11 mg/dL (ref 8–23)
CO2: 24 mmol/L (ref 22–32)
Calcium: 9.6 mg/dL (ref 8.9–10.3)
Chloride: 109 mmol/L (ref 98–111)
Creatinine, Ser: 0.88 mg/dL (ref 0.44–1.00)
GFR calc Af Amer: >60 mL/min (ref >60)
GFR calc non Af Amer: >60 mL/min (ref >60)
Glucose, Bld: 166 mg/dL — ABNORMAL HIGH (ref 70–99)
Potassium: 3.6 mmol/L (ref 3.5–5.1)
Sodium: 142 mmol/L (ref 135–145)
Total Bilirubin: 0.7 mg/dL (ref 0.3–1.2)
Total Protein: 7.8 g/dL (ref 6.5–8.1)

## 2019-11-23 LAB — RETIC PANEL
Immature Retic Fract: 15.6 % (ref 2.3–15.9)
RBC.: 3.85 MIL/uL — ABNORMAL LOW (ref 3.87–5.11)
Retic Count, Absolute: 63.1 10*3/uL (ref 19.0–186.0)
Retic Ct Pct: 1.6 % (ref 0.4–3.1)
Reticulocyte Hemoglobin: 33.1 pg (ref >27.9)

## 2019-11-23 LAB — CBC WITH DIFFERENTIAL/PLATELET
Abs Immature Granulocytes: 0.03 10*3/uL (ref 0.00–0.07)
Basophils Absolute: 0.1 10*3/uL (ref 0.0–0.1)
Basophils Relative: 1 %
Eosinophils Absolute: 0.2 10*3/uL (ref 0.0–0.5)
Eosinophils Relative: 2 %
HCT: 31.8 % — ABNORMAL LOW (ref 36.0–46.0)
Hemoglobin: 11 g/dL — ABNORMAL LOW (ref 12.0–15.0)
Immature Granulocytes: 0 %
Lymphocytes Relative: 33 %
Lymphs Abs: 2.7 10*3/uL (ref 0.7–4.0)
MCH: 28.8 pg (ref 26.0–34.0)
MCHC: 34.6 g/dL (ref 30.0–36.0)
MCV: 83.2 fL (ref 80.0–100.0)
Monocytes Absolute: 0.6 10*3/uL (ref 0.1–1.0)
Monocytes Relative: 7 %
Neutro Abs: 4.7 10*3/uL (ref 1.7–7.7)
Neutrophils Relative %: 57 %
Platelets: 313 10*3/uL (ref 150–400)
RBC: 3.82 MIL/uL — ABNORMAL LOW (ref 3.87–5.11)
RDW: 13.8 % (ref 11.5–15.5)
WBC: 8.3 10*3/uL (ref 4.0–10.5)
nRBC: 0 % (ref 0.0–0.2)

## 2019-11-23 LAB — FOLATE: Folate: 13.6 ng/mL (ref >5.9)

## 2019-11-23 LAB — IRON AND TIBC
Iron: 61 ug/dL (ref 28–170)
Saturation Ratios: 17 % (ref 10.4–31.8)
TIBC: 358 ug/dL (ref 250–450)
UIBC: 297 ug/dL

## 2019-11-23 LAB — VITAMIN B12: Vitamin B-12: 319 pg/mL (ref 180–914)

## 2019-11-23 LAB — FERRITIN: Ferritin: 33 ng/mL (ref 11–307)

## 2019-11-23 NOTE — Progress Notes (Signed)
Patient denies new problems/concerns today.   °

## 2019-11-23 NOTE — Progress Notes (Signed)
Hematology/Oncology  note [facility] Telephone:([phone] [phone]   Patient Care Team: [facility] as [facility] - General (General Practice) Earlie Server, MD as Consulting Physician (Hematology and Oncology)  REFERRING PROVIDER: [facility] [place] FOR VISIT:  Follow up of iron deficiency anemia  HISTORY OF PRESENTING ILLNESS:  Sylvia [name] is a  64 y.o.  female with PMH listed below was seen in consultation at the request of [facility], [place]*   for evaluation of iron deficiency anemia.   Reviewed patient's recent labs  05/09/2019, hemoglobin 8.1, MCV 88, platelet 470. 04/16/2019, iron panel showed saturation 18, ferritin 28, TIBC 308 Patient has been started on oral iron supplementation NiFerrex 150 mg daily.  She reports tolerating well.  Reviewed patient's previous labs ordered by primary care physician's office, anemia is chronic onset , duration is since at least July 2017.  Patient's hemoglobin was about 9 back in September 2020.  She was admitted at that time due to severe PAD and right foot gangrene.  Status post right below-knee amputation.   She was admitted in December due to acute drop of hemoglobin to 6.  She has a history of GI bleed secondary to gastric ulcer.  Received PRBC transfusion.  Patient is on Eliquis 2.5 mg twice daily. No aggravating or improving factors.  Associated signs and symptoms: Patient reports fatigue.  Denies SOB with exertion.  Denies  easy bruising, hematochezia, hemoptysis, hematuria.  She has lost 15 pounds of weight recently. Context:  History of iron deficiency:  Rectal bleeding: Positive for melena.  No recent rectal bleeding. Menstrual bleeding/ Vaginal bleeding : Denies Hematemesis or hemoptysis : denies Blood in urine : denies  Last endoscopy: 04/13/2019 EGD colonoscopy and small bowel capsule study during her hospitalization.  EGD was  negative, 04/16/2019 colonoscopy with small polyps removed, 04/18/2019 small bowel capsule study did not reach this small bowel and was inconclusive.  Fatigue: Yes.  SOB: deneis  INTERVAL HISTORY Sylvia [name] is a 64 y.o. female who has above history reviewed by me today presents for follow up visit for management of anemia Problems and complaints are listed below: Patient rescheduled her appointment.  She was last seen by me in April.  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  Today she reports feeling okay.  Chronic fatigue, not much changed.  Review of Systems  Constitutional: Positive for fatigue. Negative for appetite change, chills, fever and unexpected weight change.  HENT:   Negative for hearing loss and voice change.   Eyes: Negative for eye problems.  Respiratory: Negative for chest tightness and cough.   Cardiovascular: Negative for chest pain.  Gastrointestinal: Negative for abdominal distention, abdominal pain and blood in stool.  Endocrine: Negative for hot flashes.  Genitourinary: Negative for difficulty urinating and frequency.   Musculoskeletal: Negative for arthralgias.  Skin: Negative for itching and rash.  Neurological: Negative for extremity weakness.  Hematological: Negative for adenopathy.  Psychiatric/Behavioral: Negative for confusion.    MEDICAL HISTORY:  Past Medical History:  Diagnosis Date  . Anemia of chronic disease 05/16/2019  . Anxiety    h/o  . COPD (chronic obstructive pulmonary disease) ([place])   . Diabetes mellitus without complication ([place])   . GERD (gastroesophageal reflux disease)   . Hypertension    bp under control-off meds since 2019    SURGICAL HISTORY: Past Surgical History:  Procedure Laterality Date  . ABDOMINAL  HYSTERECTOMY    . AMPUTATION Right 12/27/2018   Procedure: AMPUTATION BELOW KNEE;  Surgeon: Algernon Huxley, MD;  Location: [facility] ORS;  Service: General;   Laterality: Right;  . CHOLECYSTECTOMY    . COLONOSCOPY WITH PROPOFOL N/A 04/16/2019   Procedure: COLONOSCOPY WITH PROPOFOL;  Surgeon: Virgel Manifold, MD;  Location: [facility] ENDOSCOPY;  Service: Endoscopy;  Laterality: N/A;  . ESOPHAGOGASTRODUODENOSCOPY (EGD) WITH PROPOFOL N/A 06/15/2018   Procedure: ESOPHAGOGASTRODUODENOSCOPY (EGD) WITH PROPOFOL;  Surgeon: Toledo, Benay Pike, MD;  Location: [facility] ENDOSCOPY;  Service: Gastroenterology;  Laterality: N/A;  . ESOPHAGOGASTRODUODENOSCOPY (EGD) WITH PROPOFOL N/A 01/03/2019   Procedure: ESOPHAGOGASTRODUODENOSCOPY (EGD) WITH PROPOFOL;  Surgeon: Lucilla Lame, MD;  Location: [facility] ENDOSCOPY;  Service: Endoscopy;  Laterality: N/A;  . GIVENS CAPSULE STUDY  04/16/2019   Procedure: GIVENS CAPSULE STUDY;  Surgeon: Virgel Manifold, MD;  Location: [facility] ENDOSCOPY;  Service: Endoscopy;;  . GIVENS CAPSULE STUDY N/A 06/25/2019   Procedure: GIVENS CAPSULE STUDY;  Surgeon: Jonathon Bellows, MD;  Location: [facility] ENDOSCOPY;  Service: Gastroenterology;  Laterality: N/A;  . LOWER EXTREMITY ANGIOGRAPHY Right 08/21/2018   Procedure: LOWER EXTREMITY ANGIOGRAPHY;  Surgeon: Algernon Huxley, MD;  Location: [place] CV LAB;  Service: Cardiovascular;  Laterality: Right;  . LOWER EXTREMITY ANGIOGRAPHY Left 08/28/2018   Procedure: LOWER EXTREMITY ANGIOGRAPHY;  Surgeon: Algernon Huxley, MD;  Location: [place] [facility] LAB;  Service: Cardiovascular;  Laterality: Left;  . LOWER EXTREMITY ANGIOGRAPHY Right 08/28/2018   Procedure: Lower Extremity Angiography;  Surgeon: Algernon Huxley, MD;  Location: [place] [facility] LAB;  Service: Cardiovascular;  Laterality: Right;  . LOWER EXTREMITY ANGIOGRAPHY Right 12/18/2018   Procedure: Lower Extremity Angiography;  Surgeon: Algernon Huxley, MD;  Location: [place] [facility] LAB;  Service: Cardiovascular;  Laterality: Right;  . LOWER EXTREMITY ANGIOGRAPHY Right 12/18/2018   Procedure: Lower Extremity Angiography;  Surgeon: Algernon Huxley, MD;  Location: [place]  CV LAB;  Service: Cardiovascular;  Laterality: Right;  . LOWER EXTREMITY ANGIOGRAPHY Left 12/21/2018   Procedure: Lower Extremity Angiography;  Surgeon: Algernon Huxley, MD;  Location: [place] [facility] LAB;  Service: Cardiovascular;  Laterality: Left;  . LOWER EXTREMITY ANGIOGRAPHY Right 12/21/2018   Procedure: Lower Extremity Angiography;  Surgeon: Algernon Huxley, MD;  Location: [place] [facility] LAB;  Service: Cardiovascular;  Laterality: Right;  . LOWER EXTREMITY INTERVENTION N/A 12/22/2018   Procedure: LOWER EXTREMITY INTERVENTION;  Surgeon: Algernon Huxley, MD;  Location: [place] [facility] LAB;  Service: Cardiovascular;  Laterality: N/A;    SOCIAL HISTORY: Social History   Socioeconomic History  . Marital status: Divorced    Spouse name: Not on file  . Number of children: Not on file  . Years of education: Not on file  . Highest education level: Not on file  Occupational History  . Not on file  Tobacco Use  . Smoking status: Former Smoker    Packs/day: 0.25    Years: 45.00    Pack years: 11.25    Types: Cigarettes    Quit date: 08/28/2018    Years since quitting: 1.2  . Smokeless tobacco: Never Used  . Tobacco comment: quit  Vaping Use  . Vaping Use: Never used  Substance and Sexual Activity  . Alcohol use: No  . Drug use: Never  . Sexual activity: Not Currently  Other Topics Concern  . Not on file  Social History Narrative  . Not on file   Social Determinants of Health   Financial Resource Strain:   .  Difficulty of Paying Living Expenses:   Food Insecurity:   . Worried About Charity fundraiser in the Last Year:   . Arboriculturist in the Last Year:   Transportation Needs:   . Film/video editor (Medical):   Marland Kitchen Lack of Transportation (Non-Medical):   Physical Activity:   . Days of Exercise per Week:   . Minutes of Exercise per Session:   Stress:   . Feeling of Stress :   Social Connections:   . Frequency of Communication with Friends and Family:   . Frequency of  Social Gatherings with Friends and Family:   . Attends Religious Services:   . Active Member of Clubs or Organizations:   . Attends Archivist Meetings:   Marland Kitchen Marital Status:   Intimate Partner Violence:   . Fear of Current or Ex-Partner:   . Emotionally Abused:   Marland Kitchen Physically Abused:   . Sexually Abused:     FAMILY HISTORY: Family History  Problem Relation Age of Onset  . Breast cancer Mother 79    ALLERGIES:  is allergic to metformin and related, penicillins, and tramadol.  MEDICATIONS:  Current Outpatient Medications  Medication Sig Dispense Refill  . acetaminophen (TYLENOL) 325 MG tablet Take 650 mg by mouth every 6 (six) hours as needed.    Marland Kitchen albuterol (PROVENTIL HFA;VENTOLIN HFA) 108 (90 Base) MCG/ACT inhaler Inhale 2 puffs into the lungs every 6 (six) hours as needed for wheezing or shortness of breath. 1 Inhaler 2  . apixaban (ELIQUIS) 2.5 MG TABS tablet Take 1 tablet (2.5 mg total) by mouth 2 (two) times daily. 60 tablet 3  . atorvastatin (LIPITOR) 10 MG tablet Take 1 tablet by mouth daily.    . cetirizine (ZYRTEC) 10 MG tablet Take 10 mg by mouth daily.    . clopidogrel (PLAVIX) 75 MG tablet Take 75 mg by mouth daily.    Marland Kitchen docusate sodium (COLACE) 100 MG capsule Take 100 mg by mouth 2 (two) times daily.    . DULoxetine (CYMBALTA) 30 MG capsule Take 30 mg by mouth daily.    . famotidine (PEPCID) 20 MG tablet Take 1 tablet (20 mg total) by mouth 2 (two) times daily. 60 tablet 1  . ferrous sulfate 325 (65 FE) MG tablet Take 325 mg by mouth daily.     Marland Kitchen gabapentin (NEURONTIN) 300 MG capsule Take 2 Capsules (600 mg) at Bedtime and 1 capsule (300 mg) in the afternoon 90 capsule 2  . lisinopril (ZESTRIL) 10 MG tablet Take 1 tablet (10 mg total) by mouth daily. 30 tablet 1  . Melatonin 1 MG TABS Take 1 tablet by mouth at bedtime.    . Multiple Vitamins-Minerals (WOMENS MULTIVITAMIN PO) Take 1 tablet by mouth daily.    . rosuvastatin (CRESTOR) 5 MG tablet Take 1 tablet  (5 mg total) by mouth daily at 6 PM. 30 tablet 1  . sitaGLIPtin (JANUVIA) 100 MG tablet Take 100 mg by mouth daily.    . Na Sulfate-K Sulfate-Mg Sulf 17.5-3.13-1.6 GM/177ML SOLN At 5 PM the day before procedure take 1 bottle and 5 hours before procedure take 1 bottle. (Patient not taking: Reported on 11/23/2019) 354 mL 0  . polyethylene glycol-electrolytes (NULYTELY) 420 g solution Prepare according to package instructions. Starting at 5:00 PM: Drink one 8 oz glass of mixture every 15 minutes until you finish half of the jug. Five hours prior to procedure, drink 8 oz glass of mixture every 15 minutes until it  is all gone. Make sure you do not drink anything 4 hours prior to your procedure. (Patient not taking: Reported on 11/23/2019) 4000 mL 0   No current facility-administered medications for this visit.     PHYSICAL EXAMINATION: ECOG PERFORMANCE STATUS: 1 - Symptomatic but completely ambulatory Vitals:   11/23/19 1431  BP: (!) 167/78  Pulse: (!) 108  Resp: 16  Temp: (!) 97.3 F (36.3 C)   Filed Weights   11/23/19 1431  Weight: 144 lb (65.3 kg)    Physical Exam Constitutional:      General: She is not in acute distress.    Comments: Patient walks with a cane  HENT:     Head: Normocephalic and atraumatic.  Eyes:     General: No scleral icterus. Cardiovascular:     Rate and Rhythm: Normal rate and regular rhythm.     Heart sounds: Normal heart sounds.  Pulmonary:     Effort: Pulmonary effort is normal. No respiratory distress.     Breath sounds: No wheezing.  Abdominal:     General: Bowel sounds are normal. There is no distension.     Palpations: Abdomen is soft.  Musculoskeletal:        General: No deformity. Normal range of motion.     Cervical back: Normal range of motion and neck supple.     Comments: Right lower extremity below-knee amputation,  Skin:    General: Skin is warm and dry.     Findings: No erythema or rash.  Neurological:     Mental Status: She is alert  and oriented to person, place, and time. Mental status is at baseline.     Cranial Nerves: No cranial nerve deficit.     Coordination: Coordination normal.  Psychiatric:        Mood and Affect: Mood normal.       CMP Latest Ref Rng & Units 07/26/2019  Glucose 70 - 99 mg/dL 274(H)  BUN 8 - 23 mg/dL 12  Creatinine 0.44 - 1.00 mg/dL 0.66  Sodium 135 - 145 mmol/L 141  Potassium 3.5 - 5.1 mmol/L 3.4(L)  Chloride 98 - 111 mmol/L 112(H)  CO2 22 - 32 mmol/L 22  Calcium 8.9 - 10.3 mg/dL 9.0  Total Protein 6.5 - 8.1 g/dL -  Total Bilirubin 0.3 - 1.2 mg/dL -  Alkaline Phos 38 - 126 U/L -  AST 15 - 41 U/L -  ALT 0 - 44 U/L -   CBC Latest Ref Rng & Units 07/26/2019  WBC 4.0 - 10.5 K/uL 7.9  Hemoglobin 12.0 - 15.0 g/dL 9.0(L)  Hematocrit 36 - 46 % 26.4(L)  Platelets 150 - 400 K/uL 314     LABORATORY DATA:  I have reviewed the data as listed Lab Results  Component Value Date   WBC 7.9 07/26/2019   HGB 9.0 (L) 07/26/2019   HCT 26.4 (L) 07/26/2019   MCV 87.1 07/26/2019   PLT 314 07/26/2019   Recent Labs    12/14/18 1240 12/14/18 2237 12/25/18 0806 12/26/18 0328 05/16/19 1142 07/25/19 0948 07/26/19 0412  NA 141   < > 141   < > 138 138 141  K 3.8   < > 4.4   < > 3.5 4.1 3.4*  CL 104   < > 111   < > 105 104 112*  CO2 20*   < > 20*   < > '25 25 22  '$ GLUCOSE 291*   < > 104*   < > 230* 219*  274*  BUN 14   < > 28*   < > '11 15 12  '$ CREATININE 1.04*   < > 1.62*   < > 0.70 0.90 0.66  CALCIUM 10.8*   < > 8.6*   < > 9.6 9.3 9.0  GFRNONAA 57*   < > 33*   < > >60 >60 >60  GFRAA >60   < > 38*   < > >60 >60 >60  PROT 8.4*  --  6.6  --  7.6  --   --   ALBUMIN 4.3  --  2.6*  --  3.9  --   --   AST 17  --  117*  --  18  --   --   ALT 15  --  56*  --  17  --   --   ALKPHOS 86  --  104  --  91  --   --   BILITOT 1.2  --  0.8  --  0.7  --   --   BILIDIR 0.1  --   --   --   --   --   --   IBILI 1.1*  --   --   --   --   --   --    < > = values in this interval not displayed.    Iron/TIBC/Ferritin/ %Sat    Component Value Date/Time   IRON 135 06/13/2019 1528   TIBC 379 06/13/2019 1528   FERRITIN 69 06/13/2019 1528   IRONPCTSAT 36 (H) 06/13/2019 1528     No results found.    ASSESSMENT & PLAN:  1. Normocytic anemia   2. ESR raised   3. History of gastrointestinal bleeding    Anemia:  Patient had blood work done in April 2021, and hemoglobin has improved to 9. She has not had blood work done since then. Her repeat CBC, iron, TIBC, ferritin, CMP, reticulocyte panel. Elevated ESR, patient has been referred to rheumatology for further evaluation.  She declined.   History of recurrent acute GI bleeding/melena, history of PUD on chronic anticoagulation. 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  Continue follow-up with GI. Follow-up in 6 months  Orders Placed This Encounter  Procedures  . Ferritin    Standing Status:   Future    Number of Occurrences:   1    Standing Expiration Date:   05/25/2020  . Iron and TIBC    Standing Status:   Future    Number of Occurrences:   1    Standing Expiration Date:   11/22/2020  . Folate    Standing Status:   Future    Number of Occurrences:   1    Standing Expiration Date:   11/22/2020  . Vitamin B12    Standing Status:   Future    Number of Occurrences:   1    Standing Expiration Date:   11/22/2020  . Comprehensive metabolic panel    Standing Status:   Future    Number of Occurrences:   1    Standing Expiration Date:   11/22/2020  . CBC with Differential/Platelet    Standing Status:   Future    Number of Occurrences:   1    Standing Expiration Date:   11/22/2020  . Retic Panel    Standing Status:   Future    Number of Occurrences:   1    Standing Expiration Date:   11/22/2020    All questions  were answered. The patient knows to call the clinic with any problems questions or concerns.    Earlie Server, MD, PhD Hematology Oncology [facility] at [facility] Pager-  [phone] 11/23/2019

## 2019-11-26 ENCOUNTER — Ambulatory Visit: Payer: Medicaid Other | Admitting: Physical Therapy

## 2019-11-26 ENCOUNTER — Other Ambulatory Visit: Payer: Self-pay | Admitting: Oncology

## 2019-11-26 ENCOUNTER — Encounter: Payer: Self-pay | Admitting: Physical Therapy

## 2019-11-26 ENCOUNTER — Other Ambulatory Visit: Payer: Self-pay

## 2019-11-26 DIAGNOSIS — M6281 Muscle weakness (generalized): Secondary | ICD-10-CM

## 2019-11-26 DIAGNOSIS — R2689 Other abnormalities of gait and mobility: Secondary | ICD-10-CM

## 2019-11-26 DIAGNOSIS — S88111A Complete traumatic amputation at level between knee and ankle, right lower leg, initial encounter: Secondary | ICD-10-CM

## 2019-11-26 DIAGNOSIS — M79661 Pain in right lower leg: Secondary | ICD-10-CM

## 2019-11-26 MED ORDER — CYANOCOBALAMIN 500 MCG PO TABS
500.0000 ug | ORAL_TABLET | Freq: Every day | ORAL | 1 refills | Status: AC
Start: 2019-11-26 — End: ?

## 2019-11-26 NOTE — Progress Notes (Signed)
Vitam

## 2019-11-26 NOTE — Therapy (Signed)
[facility] [address] [place], [place], [address] Phone: [phone]   Fax:  [phone]  Physical Therapy Treatment  Patient Details  Name: Sylvia [name] MRN: [id number] Date of Birth: [birth year] No data recorded  Encounter Date: 11/26/2019   PT End of Session - 11/26/19 0935    Visit Number 18    Number of Visits 25    Date for PT Re-Evaluation 12/19/19    Authorization - Visit Number 2    Authorization - Number of Visits 10    PT Start Time 0931    PT Stop Time 1026    PT Time Calculation (min) 55 min    Equipment Utilized During Treatment Gait belt    Activity Tolerance Patient tolerated treatment well    Behavior During Therapy WFL for tasks assessed/performed           Past Medical History:  Diagnosis Date  . Anemia of chronic disease 05/16/2019  . Anxiety    h/o  . COPD (chronic obstructive pulmonary disease) (HCC)   . Diabetes mellitus without complication (HCC)   . GERD (gastroesophageal reflux disease)   . Hypertension    bp under control-off meds since 2019    Past Surgical History:  Procedure Laterality Date  . ABDOMINAL HYSTERECTOMY    . AMPUTATION Right 12/27/2018   Procedure: AMPUTATION BELOW KNEE;  Surgeon: Annice Needy, MD;  Location: [facility];  Service: General;  Laterality: Right;  . CHOLECYSTECTOMY    . COLONOSCOPY WITH PROPOFOL N/A 04/16/2019   Procedure: COLONOSCOPY WITH PROPOFOL;  Surgeon: Pasty Spillers, MD;  Location: [facility];  Service: Endoscopy;  Laterality: N/A;  . ESOPHAGOGASTRODUODENOSCOPY (EGD) WITH PROPOFOL N/A 06/15/2018   Procedure: ESOPHAGOGASTRODUODENOSCOPY (EGD) WITH PROPOFOL;  Surgeon: Toledo, Boykin Nearing, MD;  Location: [facility] ENDOS[facility];  Service: Gastroenterology;  Laterality: N/A;  . ESOPHAGOGASTRODUODENOSCOPY (EGD) WITH PROPOFOL N/A 01/03/2019   Procedure: ESOPHAGOGASTRODUODENOSCOPY (EGD) WITH PROPOFOL;  Surgeon: Midge Minium, MD;  Location: [facility] ENDOSCOPY;   Service: Endoscopy;  Laterality: N/A;  . GIVENS CAPSULE STUDY  04/16/2019   Procedure: GIVENS CAPSULE STUDY;  Surgeon: Pasty Spillers, MD;  Location: [facility];  Service: Endoscopy;;  . GIVENS CAPSULE STUDY N/A 06/25/2019   Procedure: GIVENS CAPSULE STUDY;  Surgeon: Wyline Mood, MD;  Location: [facility] ENDOSCOPY;  Service: Gastroenterology;  Laterality: N/A;  . LOWER EXTREMITY ANGIOGRAPHY Right 08/21/2018   Procedure: LOWER EXTREMITY ANGIOGRAPHY;  Surgeon: Annice Needy, MD;  Location: [facility];  Service: Cardiovascular;  Laterality: Right;  . LOWER EXTREMITY ANGIOGRAPHY Left 08/28/2018   Procedure: LOWER EXTREMITY ANGIOGRAPHY;  Surgeon: Annice Needy, MD;  Location: [facility];  Service: Cardiovascular;  Laterality: Left;  . LOWER EXTREMITY ANGIOGRAPHY Right 08/28/2018   Procedure: Lower Extremity Angiography;  Surgeon: Annice Needy, MD;  Location: [facility];  Service: Cardiovascular;  Laterality: Right;  . LOWER EXTREMITY ANGIOGRAPHY Right 12/18/2018   Procedure: Lower Extremity Angiography;  Surgeon: Annice Needy, MD;  Location: [facility];  Service: Cardiovascular;  Laterality: Right;  . LOWER EXTREMITY ANGIOGRAPHY Right 12/18/2018   Procedure: Lower Extremity Angiography;  Surgeon: Annice Needy, MD;  Location: [facility];  Service: Cardiovascular;  Laterality: Right;  . LOWER EXTREMITY ANGIOGRAPHY Left 12/21/2018   Procedure: Lower Extremity Angiography;  Surgeon: Annice Needy, MD;  Location: [facility];  Service: Cardiovascular;  Laterality: Left;  . LOWER EXTREMITY ANGIOGRAPHY Right 12/21/2018   Procedure: Lower  Extremity Angiography;  Surgeon: Annice Needy, MD;  Location: [facility] INVASIVE CV LAB;  Service: Cardiovascular;  Laterality: Right;  . LOWER EXTREMITY INTERVENTION N/A 12/22/2018   Procedure: LOWER EXTREMITY INTERVENTION;  Surgeon: Annice Needy, MD;  Location: [facility] INV[facility] CV [facility];  Service: Cardiovascular;  Laterality: N/A;     There were no vitals filed for this visit.   Subjective Assessment - 11/26/19 0932    Subjective Pt. states she practiced driving over the weekend. Pt. states walking is going well and she was able to walk around the house without her cane. Pt. reports being able to stand for an hour over the weekend.    Pertinent History pt fell 6x before she got her prosthesis; 12/20/18 pt had below knee ampulation due to infection/gangrene    Limitations Sitting;Standing;Walking    How long can you sit comfortably? 1 hour    How long can you stand comfortably? 10 minutes (with prosthesis)    How long can you walk comfortably? A few minutes with some short rest breaks in standing    Patient Stated Goals to be able to do 7-8 steps to get in her home.  She has a ramp she can use now; to be able to walk again without AD.  She wants to be able to stand 1.5 hrs for cooking in her kitchen.    Currently in Pain? No/denies            There Ex:  Nustep 10 mins: LE only. L4 5 mins, L2 5 mins  Discussed HEP  Neuro Re-ed:  Resisted walking in // bars: 1 BTB forwards/backwards. Pt cued for increasing L leg step length in forward and backwards walking.   Unstable surface walking: blue mat with ankle weights underneath. Pt able to complete with slow and fast gait speeds. Pt able to complete stopping and starting while on mat.   Obstacle course: included 6" step up, unstable surfaces, hurdles, and airex balance beam. Pt completed multiple reps both directions, completed one time through without cane. Pt able to self correct all LOB.   Step over step 6" hurdles in // bars: pt cued for increasing R knee flexion when R LE coming over hurdle. Pt able to complete multiple reps without cane or UE assist. Pt able to self correct LOB.      PT Short Term Goals - 11/21/19 1419      PT SHORT TERM GOAL #1   Title Improve R knee extension AROM to 0 degrees to promote optimal gait mechanics with prosthesis    Baseline  -10 degrees, 6/21: -2 degrees.   7/6: R quad lag -8 deg.  8/4: 0 deg. noted during gait.    Time 4    Period Weeks    Status Achieved    Target Date 11/21/19      PT SHORT TERM GOAL #2   Title Pt will be able to tolerate 20 min standing interventions without requiring sitting rest break    Baseline 3 min, 6/21: able to perform with her FWW or parallel bars    Time 4    Period Weeks    Status Achieved    Target Date 10/23/19             PT Long Term Goals - 11/21/19 0945      PT LONG TERM GOAL #1   Title Pt will be able to ascend/descend 8 stairs independently with prosthesis with reciprocal pattern.    Baseline unable, 6/21:  pt able to ascend 1 8-inch step in parallel bars with bilateral railing using L LE to ascend and R to descend; 10/23/19: able to ascend/descend 4 steps with reciprocal pattern, limited by residual limb pain/pressure.; 11/20/19: pt able to ascend/descend 8 stairs with half reciprocal pattern. safe step to pattern with cane    Time 4    Period Weeks    Status Revised    Target Date 12/19/19      PT LONG TERM GOAL #2   Title Pt will be able to stand x 1 hour wearing prosthesis with no rest break for cooking full meal in her kitchen    Baseline 6/21: pt standing to cook but requires a chair to sit and cook from after 10-15 min.; 8/3: pt able to stand for about 30 mins, limited by pain    Time 6    Period Weeks    Status Revised    Target Date 12/19/19      PT LONG TERM GOAL #3   Title Improve FOTO score to >45/52 indicating improved overall activity tolerance    Baseline 31/52, 6/21: attempted to do FOTO but website not accessible- will attempt again at next visit; 10/23/2019: 33/52; 8/3:59/52    Time 6    Period Weeks    Status Achieved    Target Date 11/21/19      PT LONG TERM GOAL #4   Title Pt will return to driving short distances (43-88 minutes) for independent community mobility for grocery shopping and doctor's appointments    Baseline 6/21: not  driving. 8/75: pt. not driving at this time; 8/3: pt is able to now drive around a parking lot as long as she's wearing lighter weight sneakers.    Time 4    Period Weeks    Status On-going    Target Date 12/19/19      PT LONG TERM GOAL #5   Title Pt. will be able to walk at least 150 feet with no AD with supervision to improve independence.    Baseline 8/3: pt can walk about 150 feet with CGA and SPC    Time 4    Period Weeks    Status New    Target Date 12/19/19                 Plan - 11/26/19 1206    Clinical Impression Statement Pt. demonstrates improved ability with ambulation and upper level dynamic balance. Pt. completed obstacle course and stepping over hurdles today, focusing on imporved R LE control and maintaining even step length while on unstable surfaces. Pt. will continue to benefit from skilled PT to improve independent ambulation, to decrease falls risk, and improve ability to complete ADLs independently.    Personal Factors and Comorbidities Comorbidity 2    Comorbidities see med hx    Examination-Activity Limitations Locomotion Level;Sit;Squat;Stand    Examination-Participation Restrictions Meal Prep;Community Activity;Driving    Stability/Clinical Decision Making Evolving/Moderate complexity    Clinical Decision Making Moderate    Rehab Potential Good    PT Frequency 2x / week    PT Duration 4 weeks    PT Treatment/Interventions ADLs/Self Care Home Management;Neuromuscular re-education;Balance training;Therapeutic exercise;Therapeutic activities;Functional mobility training;Stair training;Gait training;Patient/family education;Prosthetic Training;Manual techniques;Energy conservation;Passive range of motion    PT Next Visit Plan cont progressing LE strengthening, gt training, balance    PT Home Exercise Plan [id number]    Consulted and Agree with Plan of Care Patient  Patient will benefit from skilled therapeutic intervention in order to improve  the following deficits and impairments:  Abnormal gait, Decreased coordination, Decreased range of motion, Difficulty walking, Prosthetic Dependency, Decreased endurance, Decreased activity tolerance, Decreased knowledge of precautions, Impaired perceived functional ability, Pain, Decreased balance, Impaired flexibility, Postural dysfunction, Decreased strength, Decreased mobility  Visit Diagnosis: Other abnormalities of gait and mobility  Below-knee amputation of right lower extremity (HCC)  Muscle weakness (generalized)  Pain in right lower leg     Problem List Patient Active Problem List   Diagnosis Date Noted  . Cocaine abuse (HCC) 07/26/2019  . Diabetes mellitus without complication (HCC)   . GERD (gastroesophageal reflux disease)   . COPD (chronic obstructive pulmonary disease) (HCC)   . GIB (gastrointestinal bleeding)   . HLD (hyperlipidemia)   . Depression   . Elevated troponin   . Syncope   . Elevated troponin I level   . Anemia of chronic disease 05/16/2019  . Dizziness   . Polyp of colon   . Acute upper GI bleeding 04/13/2019  . Anemia associated with acute blood loss 04/13/2019  . Chronic anticoagulation 04/13/2019  . 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 Symptomatic anemia 04/13/2019  . Upper GI bleed 04/13/2019  . Acute GI bleeding 04/13/2019  . 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 Melena   . DU (duodenal ulcer)   . DKA (diabetic ketoacidoses) (HCC) 12/14/2018  . Atherosclerosis of artery of extremity with rest pain (HCC) 08/28/2018  . PAD (peripheral artery disease) (HCC) 07/14/2018  . Abdominal pain 06/13/2018  . HTN (hypertension) 06/13/2018  . Non-insulin dependent type 2 diabetes mellitus (HCC) 06/13/2018  . Elevated lactic acid level 06/13/2018   Cammie Mcgee, PT, DPT # [id number] Sharyn Creamer, SPT 11/26/2019, 3:38 PM  [facility] [address] [place], [place],  [address] Phone: [phone]   Fax:  [phone]  Name: Sylvia [name] MRN: [id number] Date of Birth: Jul 06, 1955

## 2019-11-27 ENCOUNTER — Telehealth: Payer: Self-pay

## 2019-11-27 DIAGNOSIS — D649 Anemia, unspecified: Secondary | ICD-10-CM

## 2019-11-27 NOTE — Telephone Encounter (Signed)
-----   Message from Rickard Patience, MD sent at 11/26/2019  9:37 PM EDT ----- Please let her know that her hemoglobin has improved to 11, iron panel has been stable. Vitamin b12 level is borderline. Recommend her to continue take oral ferrous sulfate once daily and start to take oral vitamin b12 daily. Rx sent to pharmacy.   Follow up in 6 months. Cbc cmp iron tibc ferritin b12 1-2 days prior to her appt.thanks. please order labs.

## 2019-11-27 NOTE — Telephone Encounter (Signed)
Done  Called pt and made her aware of her upcoming lab/MD appts A reminder letter will be mailed out as well

## 2019-11-27 NOTE — Telephone Encounter (Signed)
Pt notified and voiced understanding 

## 2019-11-28 ENCOUNTER — Other Ambulatory Visit: Payer: Self-pay

## 2019-11-28 ENCOUNTER — Ambulatory Visit: Payer: Medicaid Other | Admitting: Physical Therapy

## 2019-11-28 ENCOUNTER — Encounter: Payer: Self-pay | Admitting: Physical Therapy

## 2019-11-28 DIAGNOSIS — R2689 Other abnormalities of gait and mobility: Secondary | ICD-10-CM

## 2019-11-28 DIAGNOSIS — M6281 Muscle weakness (generalized): Secondary | ICD-10-CM

## 2019-11-28 DIAGNOSIS — S88111A Complete traumatic amputation at level between knee and ankle, right lower leg, initial encounter: Secondary | ICD-10-CM

## 2019-11-28 DIAGNOSIS — M79661 Pain in right lower leg: Secondary | ICD-10-CM

## 2019-11-28 NOTE — Therapy (Signed)
[facility] [address] [place], [place], [address] Phone: [phone]   Fax:  [phone]  Physical Therapy Treatment  Patient Details  Name: Sylvia [name] MRN: [id number] Date of Birth: 10-19-1955 No data recorded  Encounter Date: 11/28/2019   PT End of Session - 11/28/19 1228    Visit Number 19    Number of Visits 25    Date for PT Re-Evaluation 12/19/19    Authorization - Visit Number 3    Authorization - Number of Visits 10    PT Start Time 0950    PT Stop Time 1030    PT Time Calculation (min) 40 min    Equipment Utilized During Treatment Gait belt    Activity Tolerance Patient tolerated treatment well    Behavior During Therapy WFL for tasks assessed/performed           Past Medical History:  Diagnosis Date  . Anemia of chronic disease 05/16/2019  . Anxiety    h/o  . COPD (chronic obstructive pulmonary disease) (HCC)   . Diabetes mellitus without complication (HCC)   . GERD (gastroesophageal reflux disease)   . Hypertension    bp under control-off meds since 2019    Past Surgical History:  Procedure Laterality Date  . ABDOMINAL HYSTERECTOMY    . AMPUTATION Right 12/27/2018   Procedure: AMPUTATION BELOW KNEE;  Surgeon: Annice Needy, MD;  Location: [facility];  Service: General;  Laterality: Right;  . CHOLECYSTECTOMY    . COLONOSCOPY WITH PROPOFOL N/A 04/16/2019   Procedure: COLONOSCOPY WITH PROPOFOL;  Surgeon: Pasty Spillers, MD;  Location: [facility];  Service: Endoscopy;  Laterality: N/A;  . ESOPHAGOGASTRODUODENOSCOPY (EGD) WITH PROPOFOL N/A 06/15/2018   Procedure: ESOPHAGOGASTRODUODENOSCOPY (EGD) WITH PROPOFOL;  Surgeon: Toledo, Boykin Nearing, MD;  Location: [facility]OS[facility];  Service: Gastroenterology;  Laterality: N/A;  . ESOPHAGOGASTRODUODENOSCOPY (EGD) WITH PROPOFOL N/A 01/03/2019   Procedure: ESOPHAGOGASTRODUODENOSCOPY (EGD) WITH PROPOFOL;  Surgeon: Midge Minium, MD;  Location: [facility];   Service: Endoscopy;  Laterality: N/A;  . GIVENS CAPSULE STUDY  04/16/2019   Procedure: GIVENS CAPSULE STUDY;  Surgeon: Pasty Spillers, MD;  Location: [facility];  Service: Endoscopy;;  . GIVENS CAPSULE STUDY N/A 06/25/2019   Procedure: GIVENS CAPSULE STUDY;  Surgeon: Wyline Mood, MD;  Location: [facility]OSCOPY;  Service: Gastroenterology;  Laterality: N/A;  . LOWER EXTREMITY ANGIOGRAPHY Right 08/21/2018   Procedure: LOWER EXTREMITY ANGIOGRAPHY;  Surgeon: Annice Needy, MD;  Location: [facility];  Service: Cardiovascular;  Laterality: Right;  . LOWER EXTREMITY ANGIOGRAPHY Left 08/28/2018   Procedure: LOWER EXTREMITY ANGIOGRAPHY;  Surgeon: Annice Needy, MD;  Location: [facility];  Service: Cardiovascular;  Laterality: Left;  . LOWER EXTREMITY ANGIOGRAPHY Right 08/28/2018   Procedure: Lower Extremity Angiography;  Surgeon: Annice Needy, MD;  Location: [facility];  Service: Cardiovascular;  Laterality: Right;  . LOWER EXTREMITY ANGIOGRAPHY Right 12/18/2018   Procedure: Lower Extremity Angiography;  Surgeon: Annice Needy, MD;  Location: [facility];  Service: Cardiovascular;  Laterality: Right;  . LOWER EXTREMITY ANGIOGRAPHY Right 12/18/2018   Procedure: Lower Extremity Angiography;  Surgeon: Annice Needy, MD;  Location: [facility];  Service: Cardiovascular;  Laterality: Right;  . LOWER EXTREMITY ANGIOGRAPHY Left 12/21/2018   Procedure: Lower Extremity Angiography;  Surgeon: Annice Needy, MD;  Location: [facility];  Service: Cardiovascular;  Laterality: Left;  . LOWER EXTREMITY ANGIOGRAPHY Right 12/21/2018   Procedure: Lower  Extremity Angiography;  Surgeon: Annice Needy, MD;  Location: [facility] INVASIVE CV LAB;  Service: Cardiovascular;  Laterality: Right;  . LOWER EXTREMITY INTERVENTION N/A 12/22/2018   Procedure: LOWER EXTREMITY INTERVENTION;  Surgeon: Annice Needy, MD;  Location: [facility] INV[facility];  Service: Cardiovascular;  Laterality: N/A;     There were no vitals filed for this visit.   Subjective Assessment - 11/28/19 1225    Subjective Pt reports being able to stand for > 1 hour cleaning out her kitchen cabinets before experiencing pain in R residual limb. Pt reports difficulty with the incline in her driveway with asc/desc but is able to perform with cane. After a seated rest yesterday pt reports about ambulating without her prosthetic, but remembered before taking steps and did not fall.    Pertinent History pt fell 6x before she got her prosthesis; 12/20/18 pt had below knee ampulation due to infection/gangrene    Limitations Sitting;Standing;Walking    How long can you sit comfortably? 1 hour    How long can you stand comfortably? 10 minutes (with prosthesis)    How long can you walk comfortably? A few minutes with some short rest breaks in standing    Patient Stated Goals to be able to do 7-8 steps to get in her home.  She has a ramp she can use now; to be able to walk again without AD.  She wants to be able to stand 1.5 hrs for cooking in her kitchen.    Currently in Pain? No/denies    Multiple Pain Sites No          There.ex:  Nu-Step L4 for 3 min with LE's only. Requested to drop down to L3 for the final 5 min. Reports of fatigue by 8 min.   Neuro Re-Ed:  Ambulating outside over uneven surfaces such as grass, tree roots, slanted side walks, slight inclines/declines in parking lot and asc/desc full outdoor staircase. Able to safely asc/desc with SPC and handrail with step to pattern desc and reciprocal for asc with supervision from therapist.   Obstacle course with SPC: CGA+1  walking over blue mat with ankle weights underneath --> stepping over 3" step leading with prosthesis --> weaving around cones placed in an "S" shape --> x5 alternating steps onto airex pad. x4 with noted sway with step ups on airex pad and amb over blue mat   B star cone taps on stable surface: x3. Difficulty standing on residual limb due to  weightbearing in prosthesis.  Star cone taps on airex pad standing on LLE and tapping with prosthesis: x2   PT Education - 11/28/19 1227    Education Details remembering to don prosthesis before transferring to standing and walking to prevent falls.    Person(s) Educated Patient    Methods Explanation    Comprehension Verbalized understanding            PT Short Term Goals - 11/21/19 1419      PT SHORT TERM GOAL #1   Title Improve R knee extension AROM to 0 degrees to promote optimal gait mechanics with prosthesis    Baseline -10 degrees, 6/21: -2 degrees.   7/6: R quad lag -8 deg.  8/4: 0 deg. noted during gait.    Time 4    Period Weeks    Status Achieved    Target Date 11/21/19      PT SHORT TERM GOAL #2   Title Pt will be able to tolerate 20 min standing  interventions without requiring sitting rest break    Baseline 3 min, 6/21: able to perform with her FWW or parallel bars    Time 4    Period Weeks    Status Achieved    Target Date 10/23/19             PT Long Term Goals - 11/21/19 0945      PT LONG TERM GOAL #1   Title Pt will be able to ascend/descend 8 stairs independently with prosthesis with reciprocal pattern.    Baseline unable, 6/21: pt able to ascend 1 8-inch step in parallel bars with bilateral railing using L LE to ascend and R to descend; 10/23/19: able to ascend/descend 4 steps with reciprocal pattern, limited by residual limb pain/pressure.; 11/20/19: pt able to ascend/descend 8 stairs with half reciprocal pattern. safe step to pattern with cane    Time 4    Period Weeks    Status Revised    Target Date 12/19/19      PT LONG TERM GOAL #2   Title Pt will be able to stand x 1 hour wearing prosthesis with no rest break for cooking full meal in her kitchen    Baseline 6/21: pt standing to cook but requires a chair to sit and cook from after 10-15 min.; 8/3: pt able to stand for about 30 mins, limited by pain    Time 6    Period Weeks    Status Revised     Target Date 12/19/19      PT LONG TERM GOAL #3   Title Improve FOTO score to >45/52 indicating improved overall activity tolerance    Baseline 31/52, 6/21: attempted to do FOTO but website not accessible- will attempt again at next visit; 10/23/2019: 33/52; 8/3:59/52    Time 6    Period Weeks    Status Achieved    Target Date 11/21/19      PT LONG TERM GOAL #4   Title Pt will return to driving short distances (84-69 minutes) for independent community mobility for grocery shopping and doctor's appointments    Baseline 6/21: not driving. 6/29: pt. not driving at this time; 8/3: pt is able to now drive around a parking lot as long as she's wearing lighter weight sneakers.    Time 4    Period Weeks    Status On-going    Target Date 12/19/19      PT LONG TERM GOAL #5   Title Pt. will be able to walk at least 150 feet with no AD with supervision to improve independence.    Baseline 8/3: pt can walk about 150 feet with CGA and SPC    Time 4    Period Weeks    Status New    Target Date 12/19/19                 Plan - 11/28/19 1228    Clinical Impression Statement Pt able to safely asc/desc full staircase outdoors with step to pattern desc and reciprocal pattern with asc with SPC and hand rail with no LOB safely. Pt demonstrates difficulty with SLS on prosthesis due to weightbearing through residual limb. Pt progressing to SLS on foam with LLE performing conetaps with prosthesis. Pt can continue to benefit from PT to improve pt's balacne on unstable surface to redcue risk of falls and improve overall functional mobility.    Personal Factors and Comorbidities Comorbidity 2    Comorbidities see med hx  Examination-Activity Limitations Locomotion Level;Sit;Squat;Stand    Examination-Participation Restrictions Meal Prep;Community Activity;Driving    Stability/Clinical Decision Making Evolving/Moderate complexity    Rehab Potential Good    PT Frequency 2x / week    PT Duration 4 weeks     PT Treatment/Interventions ADLs/Self Care Home Management;Neuromuscular re-education;Balance training;Therapeutic exercise;Therapeutic activities;Functional mobility training;Stair training;Gait training;Patient/family education;Prosthetic Training;Manual techniques;Energy conservation;Passive range of motion    PT Next Visit Plan cont progressing LE strengthening, gt training, balance    PT Home Exercise Plan [id number]    Consulted and Agree with Plan of Care Patient           Patient will benefit from skilled therapeutic intervention in order to improve the following deficits and impairments:  Abnormal gait, Decreased coordination, Decreased range of motion, Difficulty walking, Prosthetic Dependency, Decreased endurance, Decreased activity tolerance, Decreased knowledge of precautions, Impaired perceived functional ability, Pain, Decreased balance, Impaired flexibility, Postural dysfunction, Decreased strength, Decreased mobility  Visit Diagnosis: Other abnormalities of gait and mobility  Below-knee amputation of right lower extremity (HCC)  Muscle weakness (generalized)  Pain in right lower leg     Problem List Patient Active Problem List   Diagnosis Date Noted  . Cocaine abuse (HCC) 07/26/2019  . Diabetes mellitus without complication (HCC)   . GERD (gastroesophageal reflux disease)   . COPD (chronic obstructive pulmonary disease) (HCC)   . GIB (gastrointestinal bleeding)   . HLD (hyperlipidemia)   . Depression   . Elevated troponin   . Syncope   . Elevated troponin I level   . Anemia of chronic disease 05/16/2019  . Dizziness   . Polyp of colon   . Acute upper GI bleeding 04/13/2019  . Anemia associated with acute blood loss 04/13/2019  . Chronic anticoagulation 04/13/2019  . 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 Symptomatic anemia 04/13/2019  . Upper GI bleed 04/13/2019  . Acute GI bleeding 04/13/2019  . Phantom pain after amputation of lower extremity (HCC)  01/30/2019  . Melena   . DU (duodenal ulcer)   . DKA (diabetic ketoacidoses) (HCC) 12/14/2018  . Atherosclerosis of artery of extremity with rest pain (HCC) 08/28/2018  . PAD (peripheral artery disease) (HCC) 07/14/2018  . Abdominal pain 06/13/2018  . HTN (hypertension) 06/13/2018  . Non-insulin dependent type 2 diabetes mellitus (HCC) 06/13/2018  . Elevated lactic acid level 06/13/2018   Cammie Mcgee, PT, DPT # [id number] SPT 11/29/2019, 9:22 AM  [facility] [address] [place], [place], [address] Phone: [phone]   Fax:  [phone]  Name: Sylvia [name] MRN: [id number] Date of Birth: 1955-10-03

## 2019-12-03 ENCOUNTER — Other Ambulatory Visit: Payer: Self-pay

## 2019-12-03 ENCOUNTER — Ambulatory Visit: Payer: Medicaid Other | Admitting: Physical Therapy

## 2019-12-03 ENCOUNTER — Encounter: Payer: Self-pay | Admitting: Physical Therapy

## 2019-12-03 DIAGNOSIS — R2689 Other abnormalities of gait and mobility: Secondary | ICD-10-CM

## 2019-12-03 DIAGNOSIS — M6281 Muscle weakness (generalized): Secondary | ICD-10-CM

## 2019-12-03 DIAGNOSIS — S88111A Complete traumatic amputation at level between knee and ankle, right lower leg, initial encounter: Secondary | ICD-10-CM

## 2019-12-03 DIAGNOSIS — M79661 Pain in right lower leg: Secondary | ICD-10-CM

## 2019-12-03 NOTE — Therapy (Signed)
[facility] [address] [place], [place], [address] Phone: [phone]   Fax:  [phone]  Physical Therapy Treatment  Patient Details  Name: Sylvia [name] MRN: [id number] Date of Birth: 1955-09-26 No data recorded  Encounter Date: 12/03/2019   PT End of Session - 12/03/19 0932    Visit Number 20    Number of Visits 25    Date for PT Re-Evaluation 12/19/19    Authorization - Visit Number 4    Authorization - Number of Visits 10    PT Start Time 0930    PT Stop Time 1023    PT Time Calculation (min) 53 min    Equipment Utilized During Treatment Gait belt    Activity Tolerance Patient tolerated treatment well    Behavior During Therapy WFL for tasks assessed/performed           Past Medical History:  Diagnosis Date  . Anemia of chronic disease 05/16/2019  . Anxiety    h/o  . COPD (chronic obstructive pulmonary disease) (HCC)   . Diabetes mellitus without complication (HCC)   . GERD (gastroesophageal reflux disease)   . Hypertension    bp under control-off meds since 2019    Past Surgical History:  Procedure Laterality Date  . ABDOMINAL HYSTERECTOMY    . AMPUTATION Right 12/27/2018   Procedure: AMPUTATION BELOW KNEE;  Surgeon: Annice Needy, MD;  Location: [facility];  Service: General;  Laterality: Right;  . CHOLECYSTECTOMY    . COLONOSCOPY WITH PROPOFOL N/A 04/16/2019   Procedure: COLONOSCOPY WITH PROPOFOL;  Surgeon: Pasty Spillers, MD;  Location: [facility];  Service: Endoscopy;  Laterality: N/A;  . ESOPHAGOGASTRODUODENOSCOPY (EGD) WITH PROPOFOL N/A 06/15/2018   Procedure: ESOPHAGOGASTRODUODENOSCOPY (EGD) WITH PROPOFOL;  Surgeon: Toledo, Boykin Nearing, MD;  Location: [facility];  Service: Gastroenterology;  Laterality: N/A;  . ESOPHAGOGASTRODUODENOSCOPY (EGD) WITH PROPOFOL N/A 01/03/2019   Procedure: ESOPHAGOGASTRODUODENOSCOPY (EGD) WITH PROPOFOL;  Surgeon: Midge Minium, MD;  Location: [facility];   Service: Endoscopy;  Laterality: N/A;  . GIVENS CAPSULE STUDY  04/16/2019   Procedure: GIVENS CAPSULE STUDY;  Surgeon: Pasty Spillers, MD;  Location: [facility];  Service: Endoscopy;;  . GIVENS CAPSULE STUDY N/A 06/25/2019   Procedure: GIVENS CAPSULE STUDY;  Surgeon: Wyline Mood, MD;  Location: [facility] ENDOSCOPY;  Service: Gastroenterology;  Laterality: N/A;  . LOWER EXTREMITY ANGIOGRAPHY Right 08/21/2018   Procedure: LOWER EXTREMITY ANGIOGRAPHY;  Surgeon: Annice Needy, MD;  Location: [facility] LAB;  Service: Cardiovascular;  Laterality: Right;  . LOWER EXTREMITY ANGIOGRAPHY Left 08/28/2018   Procedure: LOWER EXTREMITY ANGIOGRAPHY;  Surgeon: Annice Needy, MD;  Location: [facility];  Service: Cardiovascular;  Laterality: Left;  . LOWER EXTREMITY ANGIOGRAPHY Right 08/28/2018   Procedure: Lower Extremity Angiography;  Surgeon: Annice Needy, MD;  Location: [facility];  Service: Cardiovascular;  Laterality: Right;  . LOWER EXTREMITY ANGIOGRAPHY Right 12/18/2018   Procedure: Lower Extremity Angiography;  Surgeon: Annice Needy, MD;  Location: [facility];  Service: Cardiovascular;  Laterality: Right;  . LOWER EXTREMITY ANGIOGRAPHY Right 12/18/2018   Procedure: Lower Extremity Angiography;  Surgeon: Annice Needy, MD;  Location: [facility];  Service: Cardiovascular;  Laterality: Right;  . LOWER EXTREMITY ANGIOGRAPHY Left 12/21/2018   Procedure: Lower Extremity Angiography;  Surgeon: Annice Needy, MD;  Location: [facility];  Service: Cardiovascular;  Laterality: Left;  . LOWER EXTREMITY ANGIOGRAPHY Right 12/21/2018   Procedure: Lower  Extremity Angiography;  Surgeon: Annice Needy, MD;  Location: [facility] INVASIVE CV LAB;  Service: Cardiovascular;  Laterality: Right;  . LOWER EXTREMITY INTERVENTION N/A 12/22/2018   Procedure: LOWER EXTREMITY INTERVENTION;  Surgeon: Annice Needy, MD;  Location: [facility] INVASIVE CV LAB;  Service: Cardiovascular;  Laterality: N/A;     There were no vitals filed for this visit.   Subjective Assessment - 12/03/19 0931    Subjective Pt reports being able to stand for more than an hour, wearing her leg most awake hours, and having completed stairs without use of handrails over the weekend. Pt. states she felt very steady and experienced no pain. No falls/near falls reported.    Pertinent History pt fell 6x before she got her prosthesis; 12/20/18 pt had below knee ampulation due to infection/gangrene    Limitations Sitting;Standing;Walking    How long can you sit comfortably? 1 hour    How long can you stand comfortably? 10 minutes (with prosthesis)    How long can you walk comfortably? A few minutes with some short rest breaks in standing    Patient Stated Goals to be able to do 7-8 steps to get in her home.  She has a ramp she can use now; to be able to walk again without AD.  She wants to be able to stand 1.5 hrs for cooking in her kitchen.    Currently in Pain? No/denies           There Ex:  NuStep 10 mins: L4 3 mins, L2 7 mins  Reviewed HEP   Neuro Re-Ed:  Basketball activity with weighted ball: pt standing about 10 feet from "hoop" with CGA. X10/each  R weight shift onto prosthetic   3 alternating steps R, L, to R shooting   3 alternating side steps R, L, to R shooting  Star balance:   With SPC on RUE x5 alternating tapping cones with LLE. Cueing for maintaining weight shift on R until L foot returns to ground.         PT Short Term Goals - 11/21/19 1419      PT SHORT TERM GOAL #1   Title Improve R knee extension AROM to 0 degrees to promote optimal gait mechanics with prosthesis    Baseline -10 degrees, 6/21: -2 degrees.   7/6: R quad lag -8 deg.  8/4: 0 deg. noted during gait.    Time 4    Period Weeks    Status Achieved    Target Date 11/21/19      PT SHORT TERM GOAL #2   Title Pt will be able to tolerate 20 min standing interventions without requiring sitting rest break    Baseline 3 min,  6/21: able to perform with her FWW or parallel bars    Time 4    Period Weeks    Status Achieved    Target Date 10/23/19             PT Long Term Goals - 11/21/19 0945      PT LONG TERM GOAL #1   Title Pt will be able to ascend/descend 8 stairs independently with prosthesis with reciprocal pattern.    Baseline unable, 6/21: pt able to ascend 1 8-inch step in parallel bars with bilateral railing using L LE to ascend and R to descend; 10/23/19: able to ascend/descend 4 steps with reciprocal pattern, limited by residual limb pain/pressure.; 11/20/19: pt able to ascend/descend 8 stairs with half reciprocal pattern. safe step to pattern  with cane    Time 4    Period Weeks    Status Revised    Target Date 12/19/19      PT LONG TERM GOAL #2   Title Pt will be able to stand x 1 hour wearing prosthesis with no rest break for cooking full meal in her kitchen    Baseline 6/21: pt standing to cook but requires a chair to sit and cook from after 10-15 min.; 8/3: pt able to stand for about 30 mins, limited by pain    Time 6    Period Weeks    Status Revised    Target Date 12/19/19      PT LONG TERM GOAL #3   Title Improve FOTO score to >45/52 indicating improved overall activity tolerance    Baseline 31/52, 6/21: attempted to do FOTO but website not accessible- will attempt again at next visit; 10/23/2019: 33/52; 8/3:59/52    Time 6    Period Weeks    Status Achieved    Target Date 11/21/19      PT LONG TERM GOAL #4   Title Pt will return to driving short distances (64-15 minutes) for independent community mobility for grocery shopping and doctor's appointments    Baseline 6/21: not driving. 8/30: pt. not driving at this time; 8/3: pt is able to now drive around a parking lot as long as she's wearing lighter weight sneakers.    Time 4    Period Weeks    Status On-going    Target Date 12/19/19      PT LONG TERM GOAL #5   Title Pt. will be able to walk at least 150 feet with no AD with  supervision to improve independence.    Baseline 8/3: pt can walk about 150 feet with CGA and SPC    Time 4    Period Weeks    Status New    Target Date 12/19/19                 Plan - 12/03/19 1040    Clinical Impression Statement Pt. able to ambulate around gym today with no AD, demonstrating reciprocal gait pattern with no instances of LOB. Pt. completed basketball activity with weighted ball, able to perform with footwork leading to shooting. Pt. able to better complete star balance activity with cueing to maintain weight shifted to the R the entire time. Pt. will continue to benefit from skilled PT to improve balance to decrease risk of falls.    Personal Factors and Comorbidities Comorbidity 2    Comorbidities see med hx    Examination-Activity Limitations Locomotion Level;Sit;Squat;Stand    Examination-Participation Restrictions Meal Prep;Community Activity;Driving    Stability/Clinical Decision Making Evolving/Moderate complexity    Clinical Decision Making Moderate    Rehab Potential Good    PT Frequency 2x / week    PT Duration 4 weeks    PT Treatment/Interventions ADLs/Self Care Home Management;Neuromuscular re-education;Balance training;Therapeutic exercise;Therapeutic activities;Functional mobility training;Stair training;Gait training;Patient/family education;Prosthetic Training;Manual techniques;Energy conservation;Passive range of motion    PT Next Visit Plan cont progressing LE strengthening, gt training, balance    PT Home Exercise Plan [id number]    Consulted and Agree with Plan of Care Patient           Patient will benefit from skilled therapeutic intervention in order to improve the following deficits and impairments:  Abnormal gait, Decreased coordination, Decreased range of motion, Difficulty walking, Prosthetic Dependency, Decreased endurance, Decreased activity tolerance, Decreased knowledge of  precautions, Impaired perceived functional ability, Pain,  Decreased balance, Impaired flexibility, Postural dysfunction, Decreased strength, Decreased mobility  Visit Diagnosis: Other abnormalities of gait and mobility  Below-knee amputation of right lower extremity (HCC)  Muscle weakness (generalized)  Pain in right lower leg     Problem List Patient Active Problem List   Diagnosis Date Noted  . Cocaine abuse (HCC) 07/26/2019  . Diabetes mellitus without complication (HCC)   . GERD (gastroesophageal reflux disease)   . COPD (chronic obstructive pulmonary disease) (HCC)   . GIB (gastrointestinal bleeding)   . HLD (hyperlipidemia)   . Depression   . Elevated troponin   . Syncope   . Elevated troponin I level   . Anemia of chronic disease 05/16/2019  . Dizziness   . Polyp of colon   . Acute upper GI bleeding 04/13/2019  . Anemia associated with acute blood loss 04/13/2019  . Chronic anticoagulation 04/13/2019  . 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 Symptomatic anemia 04/13/2019  . Upper GI bleed 04/13/2019  . Acute GI bleeding 04/13/2019  . 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 Melena   . DU (duodenal ulcer)   . DKA (diabetic ketoacidoses) (HCC) 12/14/2018  . Atherosclerosis of artery of extremity with rest pain (HCC) 08/28/2018  . PAD (peripheral artery disease) (HCC) 07/14/2018  . Abdominal pain 06/13/2018  . HTN (hypertension) 06/13/2018  . Non-insulin dependent type 2 diabetes mellitus (HCC) 06/13/2018  . Elevated lactic acid level 06/13/2018   Cammie Mcgee, PT, DPT # [id number], SPT 12/03/2019, 11:51 AM  [facility] [address] [place], [place], [address] Phone: [phone]   Fax:  [phone]  Name: Sylvia [name] MRN: [id number] Date of Birth: [birth year]

## 2019-12-05 ENCOUNTER — Ambulatory Visit: Payer: Medicaid Other | Admitting: Physical Therapy

## 2019-12-05 ENCOUNTER — Encounter: Payer: Self-pay | Admitting: Physical Therapy

## 2019-12-05 ENCOUNTER — Other Ambulatory Visit: Payer: Self-pay

## 2019-12-05 DIAGNOSIS — M6281 Muscle weakness (generalized): Secondary | ICD-10-CM

## 2019-12-05 DIAGNOSIS — S88111A Complete traumatic amputation at level between knee and ankle, right lower leg, initial encounter: Secondary | ICD-10-CM

## 2019-12-05 DIAGNOSIS — R2689 Other abnormalities of gait and mobility: Secondary | ICD-10-CM | POA: Diagnosis not present

## 2019-12-05 DIAGNOSIS — M79661 Pain in right lower leg: Secondary | ICD-10-CM

## 2019-12-05 NOTE — Therapy (Signed)
[facility] [address] [place], [place], [address] Phone: [phone]   Fax:  [phone]  Physical Therapy Treatment  Patient Details  Name: Sylvia [name] MRN: [id number] Date of Birth: Jul 26, 1955 No data recorded  Encounter Date: 12/05/2019   PT End of Session - 12/05/19 1116    Visit Number 21    Number of Visits 25    Date for PT Re-Evaluation 12/19/19    Authorization - Visit Number 5    Authorization - Number of Visits 10    PT Start Time 0928    PT Stop Time 1034    PT Time Calculation (min) 66 min    Equipment Utilized During Treatment Gait belt    Activity Tolerance Patient tolerated treatment well;Patient limited by pain    Behavior During Therapy WFL for tasks assessed/performed           Past Medical History:  Diagnosis Date  . Anemia of chronic disease 05/16/2019  . Anxiety    h/o  . COPD (chronic obstructive pulmonary disease) (HCC)   . Diabetes mellitus without complication (HCC)   . GERD (gastroesophageal reflux disease)   . Hypertension    bp under control-off meds since 2019    Past Surgical History:  Procedure Laterality Date  . ABDOMINAL HYSTERECTOMY    . AMPUTATION Right 12/27/2018   Procedure: AMPUTATION BELOW KNEE;  Surgeon: Annice Needy, MD;  Location: [facility];  Service: General;  Laterality: Right;  . CHOLECYSTECTOMY    . COLONOSCOPY WITH PROPOFOL N/A 04/16/2019   Procedure: COLONOSCOPY WITH PROPOFOL;  Surgeon: Pasty Spillers, MD;  Location: [facility];  Service: Endoscopy;  Laterality: N/A;  . ESOPHAGOGASTRODUODENOSCOPY (EGD) WITH PROPOFOL N/A 06/15/2018   Procedure: ESOPHAGOGASTRODUODENOSCOPY (EGD) WITH PROPOFOL;  Surgeon: Toledo, Boykin Nearing, MD;  Location: [facility];  Service: Gastroenterology;  Laterality: N/A;  . ESOPHAGOGASTRODUODENOSCOPY (EGD) WITH PROPOFOL N/A 01/03/2019   Procedure: ESOPHAGOGASTRODUODENOSCOPY (EGD) WITH PROPOFOL;  Surgeon: Midge Minium, MD;  Location:  [facility];  Service: Endoscopy;  Laterality: N/A;  . GIVENS CAPSULE STUDY  04/16/2019   Procedure: GIVENS CAPSULE STUDY;  Surgeon: Pasty Spillers, MD;  Location: [facility];  Service: Endoscopy;;  . GIVENS CAPSULE STUDY N/A 06/25/2019   Procedure: GIVENS CAPSULE STUDY;  Surgeon: Wyline Mood, MD;  Location: [facility]COPY;  Service: Gastroenterology;  Laterality: N/A;  . LOWER EXTREMITY ANGIOGRAPHY Right 08/21/2018   Procedure: LOWER EXTREMITY ANGIOGRAPHY;  Surgeon: Annice Needy, MD;  Location: [facility];  Service: Cardiovascular;  Laterality: Right;  . LOWER EXTREMITY ANGIOGRAPHY Left 08/28/2018   Procedure: LOWER EXTREMITY ANGIOGRAPHY;  Surgeon: Annice Needy, MD;  Location: [facility];  Service: Cardiovascular;  Laterality: Left;  . LOWER EXTREMITY ANGIOGRAPHY Right 08/28/2018   Procedure: Lower Extremity Angiography;  Surgeon: Annice Needy, MD;  Location: [facility];  Service: Cardiovascular;  Laterality: Right;  . LOWER EXTREMITY ANGIOGRAPHY Right 12/18/2018   Procedure: Lower Extremity Angiography;  Surgeon: Annice Needy, MD;  Location: [facility];  Service: Cardiovascular;  Laterality: Right;  . LOWER EXTREMITY ANGIOGRAPHY Right 12/18/2018   Procedure: Lower Extremity Angiography;  Surgeon: Annice Needy, MD;  Location: [facility];  Service: Cardiovascular;  Laterality: Right;  . LOWER EXTREMITY ANGIOGRAPHY Left 12/21/2018   Procedure: Lower Extremity Angiography;  Surgeon: Annice Needy, MD;  Location: [facility];  Service: Cardiovascular;  Laterality: Left;  . LOWER EXTREMITY ANGIOGRAPHY Right 12/21/2018  Procedure: Lower Extremity Angiography;  Surgeon: Annice Needy, MD;  Location: [facility];  Service: Cardiovascular;  Laterality: Right;  . LOWER EXTREMITY INTERVENTION N/A 12/22/2018   Procedure: LOWER EXTREMITY INTERVENTION;  Surgeon: Annice Needy, MD;  Location: [facility];  Service: Cardiovascular;   Laterality: N/A;    There were no vitals filed for this visit.   Subjective Assessment - 12/05/19 1111    Subjective Pt states she is TTP on R anterolateral tibia at base of residual limb. Pt has 8/10 NPS pain in same region and has been walking with R antalgic gait pattern. Pt states possible concern with vascular issues. Reports vascular appointment on September 17th.    Pertinent History pt fell 6x before she got her prosthesis; 12/20/18 pt had below knee ampulation due to infection/gangrene    Limitations Sitting;Standing;Walking    How long can you sit comfortably? 1 hour    How long can you stand comfortably? 10 minutes (with prosthesis)    How long can you walk comfortably? A few minutes with some short rest breaks in standing    Patient Stated Goals to be able to do 7-8 steps to get in her home.  She has a ramp she can use now; to be able to walk again without AD.  She wants to be able to stand 1.5 hrs for cooking in her kitchen.    Currently in Pain? Yes    Pain Score 8     Pain Location Knee    Pain Orientation Right    Pain Descriptors / Indicators Aching;Discomfort;Dull    Pain Type Acute pain    Pain Onset In the past 7 days    Pain Frequency Intermittent          Pain prior to treatment: 8/10 NPS. Antalgic gait amb into clinic with SPC.  Manual therapy:  14 min of STM to Anterolateral R residual limb and gastroc to reduce pain. Pt in seated.  6/10 pain reported after STM.     There.ex:   Nu-Step L4 for 2 min. LE's only. L3 for 8 min LE's only.   Prone hip extension:   1x20, no resistance. Verbal/tactile cues for form  1x20, 5 lbs ankle weight on distal thigh.   1x20, 10 lbs ankle weight on distal thigh. Verbal/tactile cues for form   L side-lying hip abduction:   2x20. Verbal cues for form.  Supine SLR:   2x20. Verbal/tactile cues for quad activation and form/technique.   After Nu-Step, pt reports no pain in R residual limb and only muscular fatigue in RLE.  Normalized gait pattern with SPC leaving clinic.    PT Education - 12/05/19 1114    Education Details Pain in location of weightbearing in socket. Most likely having pain due to increased SLS weightbearing on R residual limb causing pain. Supine 4 way hip exercises.    Person(s) Educated Patient    Methods Explanation;Demonstration;Tactile cues;Verbal cues    Comprehension Verbalized understanding;Returned demonstration            PT Short Term Goals - 11/21/19 1419      PT SHORT TERM GOAL #1   Title Improve R knee extension AROM to 0 degrees to promote optimal gait mechanics with prosthesis    Baseline -10 degrees, 6/21: -2 degrees.   7/6: R quad lag -8 deg.  8/4: 0 deg. noted during gait.    Time 4    Period Weeks    Status Achieved  Target Date 11/21/19      PT SHORT TERM GOAL #2   Title Pt will be able to tolerate 20 min standing interventions without requiring sitting rest break    Baseline 3 min, 6/21: able to perform with her FWW or parallel bars    Time 4    Period Weeks    Status Achieved    Target Date 10/23/19             PT Long Term Goals - 11/21/19 0945      PT LONG TERM GOAL #1   Title Pt will be able to ascend/descend 8 stairs independently with prosthesis with reciprocal pattern.    Baseline unable, 6/21: pt able to ascend 1 8-inch step in parallel bars with bilateral railing using L LE to ascend and R to descend; 10/23/19: able to ascend/descend 4 steps with reciprocal pattern, limited by residual limb pain/pressure.; 11/20/19: pt able to ascend/descend 8 stairs with half reciprocal pattern. safe step to pattern with cane    Time 4    Period Weeks    Status Revised    Target Date 12/19/19      PT LONG TERM GOAL #2   Title Pt will be able to stand x 1 hour wearing prosthesis with no rest break for cooking full meal in her kitchen    Baseline 6/21: pt standing to cook but requires a chair to sit and cook from after 10-15 min.; 8/3: pt able to stand for  about 30 mins, limited by pain    Time 6    Period Weeks    Status Revised    Target Date 12/19/19      PT LONG TERM GOAL #3   Title Improve FOTO score to >45/52 indicating improved overall activity tolerance    Baseline 31/52, 6/21: attempted to do FOTO but website not accessible- will attempt again at next visit; 10/23/2019: 33/52; 8/3:59/52    Time 6    Period Weeks    Status Achieved    Target Date 11/21/19      PT LONG TERM GOAL #4   Title Pt will return to driving short distances (74-12 minutes) for independent community mobility for grocery shopping and doctor's appointments    Baseline 6/21: not driving. 8/78: pt. not driving at this time; 8/3: pt is able to now drive around a parking lot as long as she's wearing lighter weight sneakers.    Time 4    Period Weeks    Status On-going    Target Date 12/19/19      PT LONG TERM GOAL #5   Title Pt. will be able to walk at least 150 feet with no AD with supervision to improve independence.    Baseline 8/3: pt can walk about 150 feet with CGA and SPC    Time 4    Period Weeks    Status New    Target Date 12/19/19               Plan - 12/05/19 1117    Clinical Impression Statement Pt has new reports of 8/10 NPS pain in anterolateral aspect of R residual limb. Education provided that pt is most likely experiencing pain due to therapy focus on improving weightbearing tolerance with SLS on R prosthetic. No redness, swelling, or increased warmth of R residual limb indicating vascular problems. After STM and 4 way hip exercises in supine/side-lying/prone, pt had 0/10 pain NPS and normalized gait pattern with SPC. Pt can  continue to benefit from skilled PT treatment to improve functional mobility.    Personal Factors and Comorbidities Comorbidity 2    Comorbidities see med hx    Examination-Activity Limitations Locomotion Level;Sit;Squat;Stand    Examination-Participation Restrictions Meal Prep;Community Activity;Driving     Stability/Clinical Decision Making Evolving/Moderate complexity    Rehab Potential Good    PT Frequency 2x / week    PT Duration 4 weeks    PT Treatment/Interventions ADLs/Self Care Home Management;Neuromuscular re-education;Balance training;Therapeutic exercise;Therapeutic activities;Functional mobility training;Stair training;Gait training;Patient/family education;Prosthetic Training;Manual techniques;Energy conservation;Passive range of motion    PT Next Visit Plan Gait training without SPC. R SLS on prosthesis.    PT Home Exercise Plan [id number]    Consulted and Agree with Plan of Care Patient           Patient will benefit from skilled therapeutic intervention in order to improve the following deficits and impairments:  Abnormal gait, Decreased coordination, Decreased range of motion, Difficulty walking, Prosthetic Dependency, Decreased endurance, Decreased activity tolerance, Decreased knowledge of precautions, Impaired perceived functional ability, Pain, Decreased balance, Impaired flexibility, Postural dysfunction, Decreased strength, Decreased mobility  Visit Diagnosis: Other abnormalities of gait and mobility  Below-knee amputation of right lower extremity (HCC)  Muscle weakness (generalized)  Pain in right lower leg     Problem List Patient Active Problem List   Diagnosis Date Noted  . Cocaine abuse (HCC) 07/26/2019  . Diabetes mellitus without complication (HCC)   . GERD (gastroesophageal reflux disease)   . COPD (chronic obstructive pulmonary disease) (HCC)   . GIB (gastrointestinal bleeding)   . HLD (hyperlipidemia)   . Depression   . Elevated troponin   . Syncope   . Elevated troponin I level   . Anemia of chronic disease 05/16/2019  . Dizziness   . Polyp of colon   . Acute upper GI bleeding 04/13/2019  . Anemia associated with acute blood loss 04/13/2019  . Chronic anticoagulation 04/13/2019  . 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 Symptomatic anemia  04/13/2019  . Upper GI bleed 04/13/2019  . Acute GI bleeding 04/13/2019  . 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 Melena   . DU (duodenal ulcer)   . DKA (diabetic ketoacidoses) (HCC) 12/14/2018  . Atherosclerosis of artery of extremity with rest pain (HCC) 08/28/2018  . PAD (peripheral artery disease) (HCC) 07/14/2018  . Abdominal pain 06/13/2018  . HTN (hypertension) 06/13/2018  . Non-insulin dependent type 2 diabetes mellitus (HCC) 06/13/2018  . Elevated lactic acid level 06/13/2018   Cammie Mcgee, PT, DPT # [id number] SPT 12/05/2019, 1:10 PM  [place] [facility] [address] [place], [place], [address] Phone: [phone]   Fax:  [phone]  Name: Sylvia [name] MRN: [id number] Date of Birth: [birth year]

## 2019-12-10 ENCOUNTER — Other Ambulatory Visit: Payer: Self-pay

## 2019-12-10 ENCOUNTER — Encounter: Payer: Self-pay | Admitting: Physical Therapy

## 2019-12-10 ENCOUNTER — Ambulatory Visit: Payer: Medicaid Other | Admitting: Physical Therapy

## 2019-12-10 DIAGNOSIS — M6281 Muscle weakness (generalized): Secondary | ICD-10-CM

## 2019-12-10 DIAGNOSIS — R2689 Other abnormalities of gait and mobility: Secondary | ICD-10-CM

## 2019-12-10 DIAGNOSIS — M79661 Pain in right lower leg: Secondary | ICD-10-CM

## 2019-12-10 DIAGNOSIS — S88111A Complete traumatic amputation at level between knee and ankle, right lower leg, initial encounter: Secondary | ICD-10-CM

## 2019-12-10 NOTE — Therapy (Signed)
[facility] [address] [place], [place], [address] Phone: [phone]   Fax:  [phone]  Physical Therapy Treatment  Patient Details  Name: Sylvia [name] MRN: [id number] Date of Birth: [birth year] No data recorded  Encounter Date: 12/10/2019   PT End of Session - 12/10/19 1036    Visit Number 22    Number of Visits 25    Date for PT Re-Evaluation 12/19/19    Authorization - Visit Number 6    Authorization - Number of Visits 10    PT Start Time 0931    PT Stop Time 1025    PT Time Calculation (min) 54 min    Equipment Utilized During Treatment Gait belt    Activity Tolerance Patient tolerated treatment well;Patient limited by pain    Behavior During Therapy WFL for tasks assessed/performed           Past Medical History:  Diagnosis Date  . Anemia of chronic disease 05/16/2019  . Anxiety    h/o  . COPD (chronic obstructive pulmonary disease) (HCC)   . Diabetes mellitus without complication (HCC)   . GERD (gastroesophageal reflux disease)   . Hypertension    bp under control-off meds since 2019    Past Surgical History:  Procedure Laterality Date  . ABDOMINAL HYSTERECTOMY    . AMPUTATION Right 12/27/2018   Procedure: AMPUTATION BELOW KNEE;  Surgeon: Annice Needy, MD;  Location: [facility];  Service: General;  Laterality: Right;  . CHOLECYSTECTOMY    . COLONOSCOPY WITH PROPOFOL N/A 04/16/2019   Procedure: COLONOSCOPY WITH PROPOFOL;  Surgeon: Pasty Spillers, MD;  Location: [facility];  Service: Endoscopy;  Laterality: N/A;  . ESOPHAGOGASTRODUODENOSCOPY (EGD) WITH PROPOFOL N/A 06/15/2018   Procedure: ESOPHAGOGASTRODUODENOSCOPY (EGD) WITH PROPOFOL;  Surgeon: Toledo, Boykin Nearing, MD;  Location: [facility];  Service: Gastroenterology;  Laterality: N/A;  . ESOPHAGOGASTRODUODENOSCOPY (EGD) WITH PROPOFOL N/A 01/03/2019   Procedure: ESOPHAGOGASTRODUODENOSCOPY (EGD) WITH PROPOFOL;  Surgeon: Midge Minium, MD;  Location:  [facility];  Service: Endoscopy;  Laterality: N/A;  . GIVENS CAPSULE STUDY  04/16/2019   Procedure: GIVENS CAPSULE STUDY;  Surgeon: Pasty Spillers, MD;  Location: [facility];  Service: Endoscopy;;  . GIVENS CAPSULE STUDY N/A 06/25/2019   Procedure: GIVENS CAPSULE STUDY;  Surgeon: Wyline Mood, MD;  Location: [facility];  Service: Gastroenterology;  Laterality: N/A;  . LOWER EXTREMITY ANGIOGRAPHY Right 08/21/2018   Procedure: LOWER EXTREMITY ANGIOGRAPHY;  Surgeon: Annice Needy, MD;  Location: [facility];  Service: Cardiovascular;  Laterality: Right;  . LOWER EXTREMITY ANGIOGRAPHY Left 08/28/2018   Procedure: LOWER EXTREMITY ANGIOGRAPHY;  Surgeon: Annice Needy, MD;  Location: [facility];  Service: Cardiovascular;  Laterality: Left;  . LOWER EXTREMITY ANGIOGRAPHY Right 08/28/2018   Procedure: Lower Extremity Angiography;  Surgeon: Annice Needy, MD;  Location: [facility];  Service: Cardiovascular;  Laterality: Right;  . LOWER EXTREMITY ANGIOGRAPHY Right 12/18/2018   Procedure: Lower Extremity Angiography;  Surgeon: Annice Needy, MD;  Location: [facility];  Service: Cardiovascular;  Laterality: Right;  . LOWER EXTREMITY ANGIOGRAPHY Right 12/18/2018   Procedure: Lower Extremity Angiography;  Surgeon: Annice Needy, MD;  Location: [facility];  Service: Cardiovascular;  Laterality: Right;  . LOWER EXTREMITY ANGIOGRAPHY Left 12/21/2018   Procedure: Lower Extremity Angiography;  Surgeon: Annice Needy, MD;  Location: [facility];  Service: Cardiovascular;  Laterality: Left;  . LOWER EXTREMITY ANGIOGRAPHY Right 12/21/2018  Procedure: Lower Extremity Angiography;  Surgeon: Annice Needy, MD;  Location: [facility];  Service: Cardiovascular;  Laterality: Right;  . LOWER EXTREMITY INTERVENTION N/A 12/22/2018   Procedure: LOWER EXTREMITY INTERVENTION;  Surgeon: Annice Needy, MD;  Location: [facility];  Service: Cardiovascular;   Laterality: N/A;    There were no vitals filed for this visit.   Subjective Assessment - 12/10/19 1033    Subjective Pt reports able to go to visit family in [facility] over the weekend with no issues. Pt reports "getting used to my leg" and forgetting to don R prosthetic prior to standing and almost having a couple falls. No pain prior to treatment.    Pertinent History pt fell 6x before she got her prosthesis; 12/20/18 pt had below knee ampulation due to infection/gangrene    Limitations Sitting;Standing;Walking    How long can you sit comfortably? 1 hour    How long can you stand comfortably? 10 minutes (with prosthesis)    How long can you walk comfortably? A few minutes with some short rest breaks in standing    Patient Stated Goals to be able to do 7-8 steps to get in her home.  She has a ramp she can use now; to be able to walk again without AD.  She wants to be able to stand 1.5 hrs for cooking in her kitchen.    Currently in Pain? No/denies    Pain Score 0-No pain    Pain Onset In the past 7 days    Multiple Pain Sites No          There.ex:   Nu-Sep L4 for 5 min, LE's only. Nu-Step L3 for 5 min, LE's only. Non-billable time  Neuro Re-ed:   B 3" step ups in // bars with LUE support. 1x12   B 3" step ups 2 finger support of LUE: 1x8  RLE 3" step ups no UE support: 1x12. Post lean noted. Verbal cues for shifting weight anteriorly over R knee. Improvements in Post lean after cuing.   Floor ladder:   With SPC, using ladder as visual cue for equal step length: x5   No AD, using ladder as visual cue for equal step length: 2x4   Ambulating in gym with no AD. > 100'. Equal step length noted with no LOB or sway. Consistent heel strike bilaterally.   Alternating cone taps bilaterally: 2x8. Difficulty tapping both cones bilaterally without resting foot on ground before tapping next cone. Difficulty Wb'ing on R prosthesis due to balance.   Obstacle course with no AD: stepping on blue mat  (unstable surface) --> alternating stepping over 1/2 bolster to small bolster, to large bolster, weaving around cones tightly close together --> stepping over 3" step. x4  Education on hamstring stretch in seated and supine position.    PT Education - 12/10/19 1034    Education Details Remembering to don R prosthesis in mornings and after periods of rest without it. Seated/supine ahmstring stretch with towel roll at distal residual limb. Educated to not place towel roll under knee joint.    Person(s) Educated Patient    Methods Explanation;Demonstration    Comprehension Verbalized understanding;Returned demonstration            PT Short Term Goals - 11/21/19 1419      PT SHORT TERM GOAL #1   Title Improve R knee extension AROM to 0 degrees to promote optimal gait mechanics with prosthesis    Baseline -10  degrees, 6/21: -2 degrees.   7/6: R quad lag -8 deg.  8/4: 0 deg. noted during gait.    Time 4    Period Weeks    Status Achieved    Target Date 11/21/19      PT SHORT TERM GOAL #2   Title Pt will be able to tolerate 20 min standing interventions without requiring sitting rest break    Baseline 3 min, 6/21: able to perform with her FWW or parallel bars    Time 4    Period Weeks    Status Achieved    Target Date 10/23/19             PT Long Term Goals - 11/21/19 0945      PT LONG TERM GOAL #1   Title Pt will be able to ascend/descend 8 stairs independently with prosthesis with reciprocal pattern.    Baseline unable, 6/21: pt able to ascend 1 8-inch step in parallel bars with bilateral railing using L LE to ascend and R to descend; 10/23/19: able to ascend/descend 4 steps with reciprocal pattern, limited by residual limb pain/pressure.; 11/20/19: pt able to ascend/descend 8 stairs with half reciprocal pattern. safe step to pattern with cane    Time 4    Period Weeks    Status Revised    Target Date 12/19/19      PT LONG TERM GOAL #2   Title Pt will be able to stand x 1 hour  wearing prosthesis with no rest break for cooking full meal in her kitchen    Baseline 6/21: pt standing to cook but requires a chair to sit and cook from after 10-15 min.; 8/3: pt able to stand for about 30 mins, limited by pain    Time 6    Period Weeks    Status Revised    Target Date 12/19/19      PT LONG TERM GOAL #3   Title Improve FOTO score to >45/52 indicating improved overall activity tolerance    Baseline 31/52, 6/21: attempted to do FOTO but website not accessible- will attempt again at next visit; 10/23/2019: 33/52; 8/3:59/52    Time 6    Period Weeks    Status Achieved    Target Date 11/21/19      PT LONG TERM GOAL #4   Title Pt will return to driving short distances (19-50 minutes) for independent community mobility for grocery shopping and doctor's appointments    Baseline 6/21: not driving. 9/32: pt. not driving at this time; 8/3: pt is able to now drive around a parking lot as long as she's wearing lighter weight sneakers.    Time 4    Period Weeks    Status On-going    Target Date 12/19/19      PT LONG TERM GOAL #5   Title Pt. will be able to walk at least 150 feet with no AD with supervision to improve independence.    Baseline 8/3: pt can walk about 150 feet with CGA and SPC    Time 4    Period Weeks    Status New    Target Date 12/19/19                 Plan - 12/10/19 1036    Clinical Impression Statement Pt demonstrates ability to safely ambulate in clinic without SPC on level ground. Pt displayed slight lateral sway without SPC when alternating cone taps and stepping up onto 3" step. Pt demonstrates mild  post lean with 3" step-ups leading with prosthetic. After verbal cues to lean ant over R knee, post lean was absent. Pt can continue to benefit from skilled PT to progress ambulation without SPC and to improve overall functional mobility.    Personal Factors and Comorbidities Comorbidity 2    Comorbidities see med hx    Examination-Activity  Limitations Locomotion Level;Sit;Squat;Stand    Examination-Participation Restrictions Meal Prep;Community Activity;Driving    Stability/Clinical Decision Making Evolving/Moderate complexity    Rehab Potential Good    PT Frequency 2x / week    PT Duration 4 weeks    PT Treatment/Interventions ADLs/Self Care Home Management;Neuromuscular re-education;Balance training;Therapeutic exercise;Therapeutic activities;Functional mobility training;Stair training;Gait training;Patient/family education;Prosthetic Training;Manual techniques;Energy conservation;Passive range of motion    PT Next Visit Plan Gait training without SPC. R SLS on prosthesis no SPC.    PT Home Exercise Plan [id number]    Consulted and Agree with Plan of Care Patient           Patient will benefit from skilled therapeutic intervention in order to improve the following deficits and impairments:  Abnormal gait, Decreased coordination, Decreased range of motion, Difficulty walking, Prosthetic Dependency, Decreased endurance, Decreased activity tolerance, Decreased knowledge of precautions, Impaired perceived functional ability, Pain, Decreased balance, Impaired flexibility, Postural dysfunction, Decreased strength, Decreased mobility  Visit Diagnosis: Other abnormalities of gait and mobility  Below-knee amputation of right lower extremity (HCC)  Muscle weakness (generalized)  Pain in right lower leg     Problem List Patient Active Problem List   Diagnosis Date Noted  . Cocaine abuse (HCC) 07/26/2019  . Diabetes mellitus without complication (HCC)   . GERD (gastroesophageal reflux disease)   . COPD (chronic obstructive pulmonary disease) (HCC)   . GIB (gastrointestinal bleeding)   . HLD (hyperlipidemia)   . Depression   . Elevated troponin   . Syncope   . Elevated troponin I level   . Anemia of chronic disease 05/16/2019  . Dizziness   . Polyp of colon   . Acute upper GI bleeding 04/13/2019  . Anemia associated  with acute blood loss 04/13/2019  . Chronic anticoagulation 04/13/2019  . 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 Symptomatic anemia 04/13/2019  . Upper GI bleed 04/13/2019  . Acute GI bleeding 04/13/2019  . 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 Melena   . DU (duodenal ulcer)   . DKA (diabetic ketoacidoses) (HCC) 12/14/2018  . Atherosclerosis of artery of extremity with rest pain (HCC) 08/28/2018  . PAD (peripheral artery disease) (HCC) 07/14/2018  . Abdominal pain 06/13/2018  . HTN (hypertension) 06/13/2018  . Non-insulin dependent type 2 diabetes mellitus (HCC) 06/13/2018  . Elevated lactic acid level 06/13/2018   Cammie Mcgee, PT, DPT # [id number], SPT 12/10/2019, 5:52 PM  [facility] [address] [place], [place], [address] Phone: [phone]   Fax:  [phone]  Name: Azirah Perkowski MRN: [id number] Date of Birth: 03-May-1955

## 2019-12-12 ENCOUNTER — Encounter: Payer: Self-pay | Admitting: Physical Therapy

## 2019-12-12 ENCOUNTER — Ambulatory Visit: Payer: Medicaid Other | Admitting: Physical Therapy

## 2019-12-12 ENCOUNTER — Other Ambulatory Visit (INDEPENDENT_AMBULATORY_CARE_PROVIDER_SITE_OTHER): Payer: Self-pay | Admitting: Nurse Practitioner

## 2019-12-12 ENCOUNTER — Other Ambulatory Visit: Payer: Self-pay

## 2019-12-12 DIAGNOSIS — R2689 Other abnormalities of gait and mobility: Secondary | ICD-10-CM

## 2019-12-12 DIAGNOSIS — I739 Peripheral vascular disease, unspecified: Secondary | ICD-10-CM

## 2019-12-12 DIAGNOSIS — M79661 Pain in right lower leg: Secondary | ICD-10-CM

## 2019-12-12 DIAGNOSIS — M6281 Muscle weakness (generalized): Secondary | ICD-10-CM

## 2019-12-12 DIAGNOSIS — S88111A Complete traumatic amputation at level between knee and ankle, right lower leg, initial encounter: Secondary | ICD-10-CM

## 2019-12-12 NOTE — Therapy (Signed)
[place] [facility] [address] [place], [place], [address] Phone: [phone]   Fax:  [phone]  Physical Therapy Treatment  Patient Details  Name: Sylvia [name] MRN: [id number] Date of Birth: 30-Oct-1955 No data recorded  Encounter Date: 12/12/2019   PT End of Session - 12/12/19 0935    Visit Number 23    Number of Visits 25    Date for PT Re-Evaluation 12/19/19    Authorization - Visit Number 7    Authorization - Number of Visits 10    PT Start Time 0930    PT Stop Time 1035    PT Time Calculation (min) 65 min    Equipment Utilized During Treatment Gait belt    Activity Tolerance Patient tolerated treatment well;Patient limited by pain    Behavior During Therapy WFL for tasks assessed/performed           Past Medical History:  Diagnosis Date  . Anemia of chronic disease 05/16/2019  . Anxiety    h/o  . COPD (chronic obstructive pulmonary disease) (HCC)   . Diabetes mellitus without complication (HCC)   . GERD (gastroesophageal reflux disease)   . Hypertension    bp under control-off meds since 2019    Past Surgical History:  Procedure Laterality Date  . ABDOMINAL HYSTERECTOMY    . AMPUTATION Right 12/27/2018   Procedure: AMPUTATION BELOW KNEE;  Surgeon: Annice Needy, MD;  Location: [facility];  Service: General;  Laterality: Right;  . CHOLECYSTECTOMY    . COLONOSCOPY WITH PROPOFOL N/A 04/16/2019   Procedure: COLONOSCOPY WITH PROPOFOL;  Surgeon: Pasty Spillers, MD;  Location: [facility];  Service: Endoscopy;  Laterality: N/A;  . ESOPHAGOGASTRODUODENOSCOPY (EGD) WITH PROPOFOL N/A 06/15/2018   Procedure: ESOPHAGOGASTRODUODENOSCOPY (EGD) WITH PROPOFOL;  Surgeon: Toledo, Boykin Nearing, MD;  Location: [facility];  Service: Gastroenterology;  Laterality: N/A;  . ESOPHAGOGASTRODUODENOSCOPY (EGD) WITH PROPOFOL N/A 01/03/2019   Procedure: ESOPHAGOGASTRODUODENOSCOPY (EGD) WITH PROPOFOL;  Surgeon: Midge Minium, MD;  Location:  [facility];  Service: Endoscopy;  Laterality: N/A;  . GIVENS CAPSULE STUDY  04/16/2019   Procedure: GIVENS CAPSULE STUDY;  Surgeon: Pasty Spillers, MD;  Location: [facility];  Service: Endoscopy;;  . GIVENS CAPSULE STUDY N/A 06/25/2019   Procedure: GIVENS CAPSULE STUDY;  Surgeon: Wyline Mood, MD;  Location: [facility];  Service: Gastroenterology;  Laterality: N/A;  . LOWER EXTREMITY ANGIOGRAPHY Right 08/21/2018   Procedure: LOWER EXTREMITY ANGIOGRAPHY;  Surgeon: Annice Needy, MD;  Location: [facility];  Service: Cardiovascular;  Laterality: Right;  . LOWER EXTREMITY ANGIOGRAPHY Left 08/28/2018   Procedure: LOWER EXTREMITY ANGIOGRAPHY;  Surgeon: Annice Needy, MD;  Location: [facility];  Service: Cardiovascular;  Laterality: Left;  . LOWER EXTREMITY ANGIOGRAPHY Right 08/28/2018   Procedure: Lower Extremity Angiography;  Surgeon: Annice Needy, MD;  Location: [facility];  Service: Cardiovascular;  Laterality: Right;  . LOWER EXTREMITY ANGIOGRAPHY Right 12/18/2018   Procedure: Lower Extremity Angiography;  Surgeon: Annice Needy, MD;  Location: [facility];  Service: Cardiovascular;  Laterality: Right;  . LOWER EXTREMITY ANGIOGRAPHY Right 12/18/2018   Procedure: Lower Extremity Angiography;  Surgeon: Annice Needy, MD;  Location: [facility];  Service: Cardiovascular;  Laterality: Right;  . LOWER EXTREMITY ANGIOGRAPHY Left 12/21/2018   Procedure: Lower Extremity Angiography;  Surgeon: Annice Needy, MD;  Location: [facility];  Service: Cardiovascular;  Laterality: Left;  . LOWER EXTREMITY ANGIOGRAPHY Right 12/21/2018  Procedure: Lower Extremity Angiography;  Surgeon: Annice Needy, MD;  Location: [facility];  Service: Cardiovascular;  Laterality: Right;  . LOWER EXTREMITY INTERVENTION N/A 12/22/2018   Procedure: LOWER EXTREMITY INTERVENTION;  Surgeon: Annice Needy, MD;  Location: [facility];  Service: Cardiovascular;   Laterality: N/A;    There were no vitals filed for this visit.   Subjective Assessment - 12/12/19 0933    Subjective Pt. reports that she was able to go grocery shopping independently (did not drive indep. there), and was only not able to pick up water cases. Pt. was up most of a weekend day cleaning.    Pertinent History pt fell 6x before she got her prosthesis; 12/20/18 pt had below knee ampulation due to infection/gangrene    Limitations Sitting;Standing;Walking    How long can you sit comfortably? 1 hour    How long can you stand comfortably? 10 minutes (with prosthesis)    How long can you walk comfortably? A few minutes with some short rest breaks in standing    Patient Stated Goals to be able to do 7-8 steps to get in her home.  She has a ramp she can use now; to be able to walk again without AD.  She wants to be able to stand 1.5 hrs for cooking in her kitchen.    Currently in Pain? No/denies    Pain Onset In the past 7 days            There Ex:  NuStep 10 mins., L4 5 mins, L2 5 mins.   STS technique from low surfaces: initially STS from 12" surface, progressed to using TRX to assist from heights 12-16". 2x10  Floor transfer training: Pt able to complete 3 floor transfers independently, once with chair to practice that method of getting up. Pt required no assistance and experienced no LOB throughout.   Neuro re-ed:  Walking outside: pt able to walk over series of rocks with CGA-min assist. Pt able to walk over rocks twice and over grass. 15 mins  Star balance: standing on R leg reaching, pt still experiencing difficulty with maintaining weight shift to R.   Standing march in front of // bar: 5x, cueing for increased weight shift to right and maintaining weight shift entire time.         PT Short Term Goals - 11/21/19 1419      PT SHORT TERM GOAL #1   Title Improve R knee extension AROM to 0 degrees to promote optimal gait mechanics with prosthesis    Baseline -10  degrees, 6/21: -2 degrees.   7/6: R quad lag -8 deg.  8/4: 0 deg. noted during gait.    Time 4    Period Weeks    Status Achieved    Target Date 11/21/19      PT SHORT TERM GOAL #2   Title Pt will be able to tolerate 20 min standing interventions without requiring sitting rest break    Baseline 3 min, 6/21: able to perform with her FWW or parallel bars    Time 4    Period Weeks    Status Achieved    Target Date 10/23/19             PT Long Term Goals - 11/21/19 0945      PT LONG TERM GOAL #1   Title Pt will be able to ascend/descend 8 stairs independently with prosthesis with reciprocal pattern.    Baseline unable,  6/21: pt able to ascend 1 8-inch step in parallel bars with bilateral railing using L LE to ascend and R to descend; 10/23/19: able to ascend/descend 4 steps with reciprocal pattern, limited by residual limb pain/pressure.; 11/20/19: pt able to ascend/descend 8 stairs with half reciprocal pattern. safe step to pattern with cane    Time 4    Period Weeks    Status Revised    Target Date 12/19/19      PT LONG TERM GOAL #2   Title Pt will be able to stand x 1 hour wearing prosthesis with no rest break for cooking full meal in her kitchen    Baseline 6/21: pt standing to cook but requires a chair to sit and cook from after 10-15 min.; 8/3: pt able to stand for about 30 mins, limited by pain    Time 6    Period Weeks    Status Revised    Target Date 12/19/19      PT LONG TERM GOAL #3   Title Improve FOTO score to >45/52 indicating improved overall activity tolerance    Baseline 31/52, 6/21: attempted to do FOTO but website not accessible- will attempt again at next visit; 10/23/2019: 33/52; 8/3:59/52    Time 6    Period Weeks    Status Achieved    Target Date 11/21/19      PT LONG TERM GOAL #4   Title Pt will return to driving short distances (09-73 minutes) for independent community mobility for grocery shopping and doctor's appointments    Baseline 6/21: not driving.  5/32: pt. not driving at this time; 8/3: pt is able to now drive around a parking lot as long as she's wearing lighter weight sneakers.    Time 4    Period Weeks    Status On-going    Target Date 12/19/19      PT LONG TERM GOAL #5   Title Pt. will be able to walk at least 150 feet with no AD with supervision to improve independence.    Baseline 8/3: pt can walk about 150 feet with CGA and SPC    Time 4    Period Weeks    Status New    Target Date 12/19/19              Plan - 12/12/19 1037    Clinical Impression Statement Pt. continues to demonstrate improvements in ambulation with no AD. Pt. continues to demonstrate slight residual antalgic gait as habit, able to correct with cueing. Pt. able to practice floor transfers independently. Pt. able to ambulate outdoors over very uneven terrain well with CGA. Pt. still experiencing difficultly with maintaining R weight-shifting during SLS. Pt. will continue to benefit from skilled PT to improve balance and ability to complete ADLs independently.    Personal Factors and Comorbidities Comorbidity 2    Comorbidities see med hx    Examination-Activity Limitations Locomotion Level;Sit;Squat;Stand    Examination-Participation Restrictions Meal Prep;Community Activity;Driving    Stability/Clinical Decision Making Evolving/Moderate complexity    Clinical Decision Making Moderate    Rehab Potential Good    PT Frequency 2x / week    PT Duration 4 weeks    PT Treatment/Interventions ADLs/Self Care Home Management;Neuromuscular re-education;Balance training;Therapeutic exercise;Therapeutic activities;Functional mobility training;Stair training;Gait training;Patient/family education;Prosthetic Training;Manual techniques;Energy conservation;Passive range of motion    PT Next Visit Plan Gait training without SPC. R SLS on prosthesis no SPC.    PT Home Exercise Plan [id number]    Consulted and  Agree with Plan of Care Patient           Patient will  benefit from skilled therapeutic intervention in order to improve the following deficits and impairments:  Abnormal gait, Decreased coordination, Decreased range of motion, Difficulty walking, Prosthetic Dependency, Decreased endurance, Decreased activity tolerance, Decreased knowledge of precautions, Impaired perceived functional ability, Pain, Decreased balance, Impaired flexibility, Postural dysfunction, Decreased strength, Decreased mobility  Visit Diagnosis: Below-knee amputation of right lower extremity (HCC)  Other abnormalities of gait and mobility  Muscle weakness (generalized)  Pain in right lower leg     Problem List Patient Active Problem List   Diagnosis Date Noted  . Cocaine abuse (HCC) 07/26/2019  . Diabetes mellitus without complication (HCC)   . GERD (gastroesophageal reflux disease)   . COPD (chronic obstructive pulmonary disease) (HCC)   . GIB (gastrointestinal bleeding)   . HLD (hyperlipidemia)   . Depression   . Elevated troponin   . Syncope   . Elevated troponin I level   . Anemia of chronic disease 05/16/2019  . Dizziness   . Polyp of colon   . Acute upper GI bleeding 04/13/2019  . Anemia associated with acute blood loss 04/13/2019  . Chronic anticoagulation 04/13/2019  . 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 Symptomatic anemia 04/13/2019  . Upper GI bleed 04/13/2019  . Acute GI bleeding 04/13/2019  . 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 Melena   . DU (duodenal ulcer)   . DKA (diabetic ketoacidoses) (HCC) 12/14/2018  . Atherosclerosis of artery of extremity with rest pain (HCC) 08/28/2018  . PAD (peripheral artery disease) (HCC) 07/14/2018  . Abdominal pain 06/13/2018  . HTN (hypertension) 06/13/2018  . Non-insulin dependent type 2 diabetes mellitus (HCC) 06/13/2018  . Elevated lactic acid level 06/13/2018   Cammie Mcgee, PT, DPT # [id number] Sharyn Creamer, SPT 12/12/2019, 11:35 AM  [place] [facility] [address] [place], [place], [address] Phone: [phone]   Fax:  [phone]  Name: Seda Tischner MRN: [id number] Date of Birth: November 01, 1955

## 2019-12-14 ENCOUNTER — Ambulatory Visit (INDEPENDENT_AMBULATORY_CARE_PROVIDER_SITE_OTHER): Payer: Medicaid Other | Admitting: Vascular Surgery

## 2019-12-14 ENCOUNTER — Ambulatory Visit (INDEPENDENT_AMBULATORY_CARE_PROVIDER_SITE_OTHER): Payer: Medicaid Other

## 2019-12-14 ENCOUNTER — Other Ambulatory Visit: Payer: Self-pay

## 2019-12-14 ENCOUNTER — Encounter (INDEPENDENT_AMBULATORY_CARE_PROVIDER_SITE_OTHER): Payer: Self-pay | Admitting: Vascular Surgery

## 2019-12-14 VITALS — BP 179/84 | HR 96 | Ht 67.0 in | Wt 147.0 lb

## 2019-12-14 DIAGNOSIS — E785 Hyperlipidemia, unspecified: Secondary | ICD-10-CM

## 2019-12-14 DIAGNOSIS — Z89511 Acquired absence of right leg below knee: Secondary | ICD-10-CM

## 2019-12-14 DIAGNOSIS — I10 Essential (primary) hypertension: Secondary | ICD-10-CM | POA: Diagnosis not present

## 2019-12-14 DIAGNOSIS — E119 Type 2 diabetes mellitus without complications: Secondary | ICD-10-CM | POA: Diagnosis not present

## 2019-12-14 DIAGNOSIS — I739 Peripheral vascular disease, unspecified: Secondary | ICD-10-CM

## 2019-12-14 NOTE — Progress Notes (Signed)
MRN : [id number]  Sylvia [name] is a 64 y.o. (1956/04/14) female who presents with chief complaint of  Chief Complaint  Patient presents with  . Follow-up    6 mo U/S   .  History of Present Illness: Patient returns today in follow up of her PAD.  She is almost a year out from a right below-knee amputation for irreversible ischemia with PAD.  She reported continued phantom pain even on 600 mg of Neurontin in the morning and 900 mg at night.  Overall however she is doing quite well.  She is walking with a prosthesis.  She is doing great with physical therapy.  She has no current complaints in her left leg although she has some degree of PAD on that side as well.  Her ABI today is actually up to 0.99 on the left side likely indicative of increased activity.  Current Outpatient Medications  Medication Sig Dispense Refill  . acetaminophen (TYLENOL) 325 MG tablet Take 650 mg by mouth every 6 (six) hours as needed.    Marland Kitchen albuterol (PROVENTIL HFA;VENTOLIN HFA) 108 (90 Base) MCG/ACT inhaler Inhale 2 puffs into the lungs every 6 (six) hours as needed for wheezing or shortness of breath. 1 Inhaler 2  . apixaban (ELIQUIS) 2.5 MG TABS tablet Take 1 tablet (2.5 mg total) by mouth 2 (two) times daily. 60 tablet 3  . atorvastatin (LIPITOR) 10 MG tablet Take 1 tablet by mouth daily.    . cetirizine (ZYRTEC) 10 MG tablet Take 10 mg by mouth daily.    . clopidogrel (PLAVIX) 75 MG tablet Take 75 mg by mouth daily.    Marland Kitchen docusate sodium (COLACE) 100 MG capsule Take 100 mg by mouth 2 (two) times daily.    . DULoxetine (CYMBALTA) 30 MG capsule Take 30 mg by mouth daily.    . ferrous sulfate 325 (65 FE) MG tablet Take 325 mg by mouth daily.     Marland Kitchen gabapentin (NEURONTIN) 300 MG capsule Take 2 Capsules (600 mg) at Bedtime and 1 capsule (300 mg) in the afternoon 90 capsule 2  . lisinopril (ZESTRIL) 10 MG tablet Take 1 tablet (10 mg total) by mouth daily. 30 tablet 1  . Melatonin 1 MG TABS Take 1 tablet by mouth  at bedtime.    . Multiple Vitamins-Minerals (WOMENS MULTIVITAMIN PO) Take 1 tablet by mouth daily.    . Na Sulfate-K Sulfate-Mg Sulf 17.5-3.13-1.6 GM/177ML SOLN At 5 PM the day before procedure take 1 bottle and 5 hours before procedure take 1 bottle. 354 mL 0  . polyethylene glycol-electrolytes (NULYTELY) 420 g solution Prepare according to package instructions. Starting at 5:00 PM: Drink one 8 oz glass of mixture every 15 minutes until you finish half of the jug. Five hours prior to procedure, drink 8 oz glass of mixture every 15 minutes until it is all gone. Make sure you do not drink anything 4 hours prior to your procedure. 4000 mL 0  . rosuvastatin (CRESTOR) 5 MG tablet Take 1 tablet (5 mg total) by mouth daily at 6 PM. 30 tablet 1  . sitaGLIPtin (JANUVIA) 100 MG tablet Take 100 mg by mouth daily.    . vitamin B-12 (CYANOCOBALAMIN) 500 MCG tablet Take 1 tablet (500 mcg total) by mouth daily. 90 tablet 1  . famotidine (PEPCID) 20 MG tablet Take 1 tablet (20 mg total) by mouth 2 (two) times daily. 60 tablet 1   No current facility-administered medications for this visit.    Past  Medical History:  Diagnosis Date  . Anemia of chronic disease 05/16/2019  . Anxiety    h/o  . COPD (chronic obstructive pulmonary disease) ([place])   . Diabetes mellitus without complication ([place])   . GERD (gastroesophageal reflux disease)   . Hypertension    bp under control-off meds since 2019    Past Surgical History:  Procedure Laterality Date  . ABDOMINAL HYSTERECTOMY    . AMPUTATION Right 12/27/2018   Procedure: AMPUTATION BELOW KNEE;  Surgeon: Algernon Huxley, MD;  Location: [facility];  Service: General;  Laterality: Right;  . CHOLECYSTECTOMY    . COLONOSCOPY WITH PROPOFOL N/A 04/16/2019   Procedure: COLONOSCOPY WITH PROPOFOL;  Surgeon: Virgel Manifold, MD;  Location: [facility];  Service: Endoscopy;  Laterality: N/A;  . ESOPHAGOGASTRODUODENOSCOPY (EGD) WITH PROPOFOL N/A 06/15/2018   Procedure:  ESOPHAGOGASTRODUODENOSCOPY (EGD) WITH PROPOFOL;  Surgeon: Toledo, Benay Pike, MD;  Location: [facility];  Service: Gastroenterology;  Laterality: N/A;  . ESOPHAGOGASTRODUODENOSCOPY (EGD) WITH PROPOFOL N/A 01/03/2019   Procedure: ESOPHAGOGASTRODUODENOSCOPY (EGD) WITH PROPOFOL;  Surgeon: Lucilla Lame, MD;  Location: [facility];  Service: Endoscopy;  Laterality: N/A;  . GIVENS CAPSULE STUDY  04/16/2019   Procedure: GIVENS CAPSULE STUDY;  Surgeon: Virgel Manifold, MD;  Location: [facility];  Service: Endoscopy;;  . GIVENS CAPSULE STUDY N/A 06/25/2019   Procedure: GIVENS CAPSULE STUDY;  Surgeon: Jonathon Bellows, MD;  Location: [facility];  Service: Gastroenterology;  Laterality: N/A;  . LOWER EXTREMITY ANGIOGRAPHY Right 08/21/2018   Procedure: LOWER EXTREMITY ANGIOGRAPHY;  Surgeon: Algernon Huxley, MD;  Location: [place] [facility];  Service: Cardiovascular;  Laterality: Right;  . LOWER EXTREMITY ANGIOGRAPHY Left 08/28/2018   Procedure: LOWER EXTREMITY ANGIOGRAPHY;  Surgeon: Algernon Huxley, MD;  Location: [place] [facility];  Service: Cardiovascular;  Laterality: Left;  . LOWER EXTREMITY ANGIOGRAPHY Right 08/28/2018   Procedure: Lower Extremity Angiography;  Surgeon: Algernon Huxley, MD;  Location: [place] [facility];  Service: Cardiovascular;  Laterality: Right;  . LOWER EXTREMITY ANGIOGRAPHY Right 12/18/2018   Procedure: Lower Extremity Angiography;  Surgeon: Algernon Huxley, MD;  Location: [place] [facility];  Service: Cardiovascular;  Laterality: Right;  . LOWER EXTREMITY ANGIOGRAPHY Right 12/18/2018   Procedure: Lower Extremity Angiography;  Surgeon: Algernon Huxley, MD;  Location: [place] [facility];  Service: Cardiovascular;  Laterality: Right;  . LOWER EXTREMITY ANGIOGRAPHY Left 12/21/2018   Procedure: Lower Extremity Angiography;  Surgeon: Algernon Huxley, MD;  Location: [place] [facility];  Service: Cardiovascular;  Laterality: Left;  . LOWER EXTREMITY ANGIOGRAPHY Right 12/21/2018   Procedure:  Lower Extremity Angiography;  Surgeon: Algernon Huxley, MD;  Location: [place] [facility];  Service: Cardiovascular;  Laterality: Right;  . LOWER EXTREMITY INTERVENTION N/A 12/22/2018   Procedure: LOWER EXTREMITY INTERVENTION;  Surgeon: Algernon Huxley, MD;  Location: [place] [facility];  Service: Cardiovascular;  Laterality: N/A;     Social History   Tobacco Use  . Smoking status: Former Smoker    Packs/day: 0.25    Years: 45.00    Pack years: 11.25    Types: Cigarettes    Quit date: 08/28/2018    Years since quitting: 1.2  . Smokeless tobacco: Never Used  . Tobacco comment: quit  Vaping Use  . Vaping Use: Never used  Substance Use Topics  . Alcohol use: No  . Drug use: Never      Family History  Problem Relation Age of Onset  . Breast cancer Mother 62  Allergies  Allergen Reactions  . Metformin And Related Diarrhea  . Penicillins Hives    Has patient had a PCN reaction causing immediate rash, facial/tongue/throat swelling, SOB or lightheadedness with hypotension: Yes Has patient had a PCN reaction causing severe rash involving mucus membranes or skin necrosis: No Has patient had a PCN reaction that required hospitalization: No Has patient had a PCN reaction occurring within the last 10 years: No If all of the above answers are "NO", then may proceed with Cephalosporin use.  . Tramadol Itching     REVIEW OF SYSTEMS (Negative unless checked)  Constitutional: _0 Weight loss  _1 Fever  _2 Chills Cardiac: _3 Chest pain   _4 Chest pressure   _5 Palpitations   _6 Shortness of breath when laying flat   _7 Shortness of breath at rest   _8 Shortness of breath with exertion. Vascular:  _9 Pain in legs with walking   _10 Pain in legs at rest   _11 Pain in legs when laying flat   _12 Claudication   _13 Pain in feet when walking  _14 Pain in feet at rest  _15 Pain in feet when laying flat   _16 History of DVT   _17 Phlebitis   _18 Swelling in legs   _19 Varicose veins   _20 Non-healing ulcers Pulmonary:   _21 Uses  home oxygen   _22 Productive cough   _23 Hemoptysis   _24 Wheeze  _25 COPD   _26 Asthma Neurologic:  _27 Dizziness  _28 Blackouts   _29 Seizures   _30 History of stroke   _31 History of TIA  _32 Aphasia   _33 Temporary blindness   _34 Dysphagia   _35 Weakness or numbness in arms   _36 Weakness or numbness in legs Musculoskeletal:  _37 Arthritis   _38 Joint swelling   _39 Joint pain   _40 Low back pain Hematologic:  _41 Easy bruising  _42 Easy bleeding   _43 Hypercoagulable state   _44 Anemic   Gastrointestinal:  _45 Blood in stool   _46 Vomiting blood  _47 Gastroesophageal reflux/heartburn   _48 Abdominal pain Genitourinary:  _49 Chronic kidney disease   _50 Difficult urination  _51 Frequent urination  _52 Burning with urination   _53 Hematuria Skin:  _54 Rashes   _55 Ulcers   _56 Wounds Psychological:  _57 History of anxiety   _58  History of major depression.  Physical Examination  BP (!) 179/84   Pulse 96   Ht _59  (1.702 m)   Wt 147 lb (66.7 kg)   BMI 23.02 kg/m  Gen:  WD/WN, NAD Head: [place]/AT, No temporalis wasting. Ear/Nose/Throat: Hearing grossly intact, nares w/o erythema or drainage Eyes: Conjunctiva clear. Sclera non-icteric Neck: Supple.  Trachea midline Pulmonary:  Good air movement, no use of accessory muscles.  Cardiac: RRR, no JVD Vascular:  Vessel Right Left  Radial Palpable Palpable                          PT Not Palpable 1+ Palpable  DP Not Palpable 2+ Palpable   Gastrointestinal: soft, non-tender/non-distended. No guarding/reflex.  Musculoskeletal: M/S 5/5 throughout.  No deformity or atrophy. Right BKA prosthesis in place. No left leg edema. Neurologic: Sensation grossly intact in extremities.  Symmetrical.  Speech is fluent.  Psychiatric: Judgment intact, Mood & affect appropriate for pt's clinical situation. Dermatologic: No rashes or ulcers noted.  No cellulitis or open wounds.       Labs Recent Results (from the past 2160 hour(s))  SARS CORONAVIRUS 2 (TAT 6-24 HRS) Nasopharyngeal Nasopharyngeal Swab     Status:  None   Collection Time: 11/07/19  1:57 PM   Specimen: Nasopharyngeal Swab  Result Value Ref Range   SARS Coronavirus 2 NEGATIVE NEGATIVE    Comment: (NOTE)  SARS-CoV-2 target nucleic acids are NOT DETECTED.  The SARS-CoV-2 RNA is generally detectable in upper and lower respiratory specimens during the acute phase of infection. Negative results do not preclude SARS-CoV-2 infection, do not rule out co-infections with other pathogens, and should not be used as the sole basis for treatment or other patient management decisions. Negative results must be combined with clinical observations, patient history, and epidemiological information. The expected result is Negative.  Fact Sheet for Patients: SugarRoll.be  Fact Sheet for Healthcare Providers: https://www.woods-mathews.com/  This test is not yet approved or cleared by the [place] FDA and  has been authorized for detection and/or diagnosis of SARS-CoV-2 by FDA under an Emergency Use Authorization (EUA). This EUA will remain  in effect (meaning this test can be used) for the duration of the COVID-19 declaration under Se ction 564(b)(1) of the Act, 21 U.S.C. section 360bbb-3(b)(1), unless the authorization is terminated or revoked sooner.  Performed at [facility], [place] [address], [place], [place] [address]   Retic Panel     Status: Abnormal   Collection Time: 11/23/19  3:05 PM  Result Value Ref Range   Retic Ct Pct 1.6 0.4 - 3.1 %   RBC. 3.85 (L) 3.87 - 5.11 MIL/uL   Retic Count, Absolute 63.1 19.0 - 186.0 K/uL   Immature Retic Fract 15.6 2.3 - 15.9 %   Reticulocyte Hemoglobin 33.1 >27.9 pg    Comment:        Given the high negative predictive value of a RET-He result > 32 pg iron deficiency is essentially excluded. If this patient is anemic other etiologies should be considered. Performed at [facility], [address] [place], [place] [address]   CBC with  Differential/Platelet     Status: Abnormal   Collection Time: 11/23/19  3:05 PM  Result Value Ref Range   WBC 8.3 4.0 - 10.5 K/uL   RBC 3.82 (L) 3.87 - 5.11 MIL/uL   Hemoglobin 11.0 (L) 12.0 - 15.0 g/dL   HCT 31.8 (L) 36 - 46 %   MCV 83.2 80.0 - 100.0 fL   MCH 28.8 26.0 - 34.0 pg   MCHC 34.6 30.0 - 36.0 g/dL   RDW 13.8 11.5 - 15.5 %   Platelets 313 150 - 400 K/uL   nRBC 0.0 0.0 - 0.2 %   Neutrophils Relative % 57 %   Neutro Abs 4.7 1.7 - 7.7 K/uL   Lymphocytes Relative 33 %   Lymphs Abs 2.7 0.7 - 4.0 K/uL   Monocytes Relative 7 %   Monocytes Absolute 0.6 0 - 1 K/uL   Eosinophils Relative 2 %   Eosinophils Absolute 0.2 0 - 0 K/uL   Basophils Relative 1 %   Basophils Absolute 0.1 0 - 0 K/uL   Immature Granulocytes 0 %   Abs Immature Granulocytes 0.03 0.00 - 0.07 K/uL    Comment: Performed at [facility], [address] [place],  [address]  Comprehensive metabolic panel     Status: Abnormal   Collection Time: 11/23/19  3:05 PM  Result Value Ref Range   Sodium 142 135 - 145 mmol/L   Potassium 3.6 3.5 - 5.1 mmol/L   Chloride 109 98 - 111 mmol/L   CO2 24 22 - 32 mmol/L   Glucose, Bld 166 (H) 70 - 99 mg/dL    Comment: Glucose reference range applies only to samples taken after fasting for at least 8 hours.   BUN 11 8 -  23 mg/dL   Creatinine, Ser 0.88 0.44 - 1.00 mg/dL   Calcium 9.6 8.9 - 10.3 mg/dL   Total Protein 7.8 6.5 - 8.1 g/dL   Albumin 4.1 3.5 - 5.0 g/dL   AST 16 15 - 41 U/L   ALT 13 0 - 44 U/L   Alkaline Phosphatase 103 38 - 126 U/L   Total Bilirubin 0.7 0.3 - 1.2 mg/dL   GFR calc non Af Amer >60 >60 mL/min   GFR calc Af Amer >60 >60 mL/min   Anion gap 9 5 - 15    Comment: Performed at [facility], [address] [place], [place] [address]  Vitamin B12     Status: None   Collection Time: 11/23/19  3:05 PM  Result Value Ref Range   Vitamin B-12 319 180 - 914 pg/mL    Comment: (NOTE) This assay is not validated for testing neonatal  or myeloproliferative syndrome specimens for Vitamin B12 levels. Performed at [facility], [place] [address], [place], [place] [address]   Folate     Status: None   Collection Time: 11/23/19  3:05 PM  Result Value Ref Range   Folate 13.6 >5.9 ng/mL    Comment: Performed at [facility], [address] [place], [place] [address]  Iron and TIBC     Status: None   Collection Time: 11/23/19  3:05 PM  Result Value Ref Range   Iron 61 28 - 170 ug/dL   TIBC 358 250 - 450 ug/dL   Saturation Ratios 17 10.4 - 31.8 %   UIBC 297 ug/dL    Comment: Performed at [facility], [address] [place], [place] [address]  Ferritin     Status: None   Collection Time: 11/23/19  3:05 PM  Result Value Ref Range   Ferritin 33 11 - 307 ng/mL    Comment: Performed at [facility], [address] [place], [place] [address]    Radiology VAS [place] ABI WITH/WO TBI  Result Date: 12/14/2019 LOWER EXTREMITY DOPPLER STUDY Indications: Peripheral artery disease, and Right leg weakness and right BKA.  Vascular Interventions: 08/21/18: Right SFA/popliteal artery PTA/stent;                         08/28/18: Left EIA & SFA/popliteal artery stents;                         08/28/18: Right popliteal, TP trunk & peroneal artery                         thrombectomies/PTAs;                         right BKA 12/27/2018. Comparison Study: 06/14/2019 Performing Technologist: Concha Norway RVT  Examination Guidelines: A complete evaluation includes at minimum, Doppler waveform signals and systolic blood pressure reading at the level of bilateral brachial, anterior tibial, and posterior tibial arteries, when vessel segments are accessible. Bilateral testing is considered an integral part of a complete examination. Photoelectric Plethysmograph (PPG) waveforms and toe systolic pressure readings are included as required and additional duplex testing as needed. Limited examinations for reoccurring indications may be  performed as noted.  ABI Findings: +--------+------------------+-----+--------+--------+ Right   Rt Pressure (mmHg)IndexWaveformComment  +--------+------------------+-----+--------+--------+ [id number]                                     +--------+------------------+-----+--------+--------+ +---------+------------------+-----+--------+-------+  Left     Lt Pressure (mmHg)IndexWaveformComment +---------+------------------+-----+--------+-------+ Brachial 140                                    +---------+------------------+-----+--------+-------+ ATA      142               0.99 biphasic        +---------+------------------+-----+--------+-------+ PTA      141               0.98 biphasic        +---------+------------------+-----+--------+-------+ Great Toe110               0.76 Normal          +---------+------------------+-----+--------+-------+ +-------+-----------+-----------+------------+------------+ ABI/TBIToday's ABIToday's TBIPrevious ABIPrevious TBI +-------+-----------+-----------+------------+------------+ Right  BKA                                            +-------+-----------+-----------+------------+------------+ Left   .99        .76        .84         .55          +-------+-----------+-----------+------------+------------+ Left ABIs and TBIs appear increased compared to prior study on 06/14/2019.  Summary: Right: BKA. Left: Resting left ankle-brachial index is within normal range. No evidence of significant left lower extremity arterial disease. The left toe-brachial index is normal.  *See table(s) above for measurements and observations.  Electronically signed by Leotis Pain MD on 12/14/2019 at 63:87:56 PM.    Final    VAS [place] LOWER EXTREMITY ARTERIAL DUPLEX  Result Date: 12/14/2019 LOWER EXTREMITY ARTERIAL DUPLEX STUDY Indications: Right BKA.  Vascular Interventions: 12/27/2018 Right BKA. Current ABI:            lt = .99 Comparison Study:  01/16/2019 Performing Technologist: Concha Norway RVT  Examination Guidelines: A complete evaluation includes B-mode imaging, spectral Doppler, color Doppler, and power Doppler as needed of all accessible portions of each vessel. Bilateral testing is considered an integral part of a complete examination. Limited examinations for reoccurring indications may be performed as noted.  +----------+--------+-----+---------------+----------+--------+ LEFT      PSV cm/sRatioStenosis       Waveform  Comments +----------+--------+-----+---------------+----------+--------+ CFA Mid   188                         monophasic         +----------+--------+-----+---------------+----------+--------+ DFA       402          50-74% stenosismonophasic         +----------+--------+-----+---------------+----------+--------+ SFA Prox  99                          triphasic          +----------+--------+-----+---------------+----------+--------+ SFA Mid   36                          triphasic stent    +----------+--------+-----+---------------+----------+--------+ SFA Distal37                          triphasic stent    +----------+--------+-----+---------------+----------+--------+ POP Distal57  biphasic           +----------+--------+-----+---------------+----------+--------+ ATA Distal30                          biphasic           +----------+--------+-----+---------------+----------+--------+ PTA Distal40                          biphasic           +----------+--------+-----+---------------+----------+--------+  Summary: Left: Patent LE vessels and stent throughout. Improved flow compared to previous study of 01/16/2019.  See table(s) above for measurements and observations. Electronically signed by Leotis Pain MD on 12/14/2019 at 12:06:21 PM.    Final     Assessment/Plan  HTN (hypertension) blood pressure control important in reducing the progression of  atherosclerotic disease. On appropriate oral medications.   Hx of right BKA ([place]) About a year ago, healed.  HLD (hyperlipidemia) lipid control important in reducing the progression of atherosclerotic disease. Continue statin therapy   Non-insulin dependent type 2 diabetes mellitus (HCC) blood glucose control important in reducing the progression of atherosclerotic disease. Also, involved in wound healing. On appropriate medications.   PAD (peripheral artery disease) (HCC) Her ABI today is actually up to 0.99 on the left side likely indicative of increased activity.  This is encouraging.  She is doing quite well.  I did increase her dose of Neurontin to 600 mg in the morning and lunch and then 900 mg at night for her phantom pain.  If this does not work, we could increase the dose slightly more or consider the addition of Lyrica.  For her left leg, we will recheck her perfusion in about 6 months and if that remains stable we can likely go to an annual basis.    Leotis Pain, MD  12/14/2019 1:42 PM    This note was created with Dragon medical transcription system.  Any errors from dictation are purely unintentional

## 2019-12-14 NOTE — Assessment & Plan Note (Signed)
About a year ago, healed.

## 2019-12-14 NOTE — Assessment & Plan Note (Signed)
blood pressure control important in reducing the progression of atherosclerotic disease. On appropriate oral medications.  

## 2019-12-14 NOTE — Assessment & Plan Note (Signed)
blood glucose control important in reducing the progression of atherosclerotic disease. Also, involved in wound healing. On appropriate medications.  

## 2019-12-14 NOTE — Assessment & Plan Note (Signed)
Her ABI today is actually up to 0.99 on the left side likely indicative of increased activity.  This is encouraging.  She is doing quite well.  I did increase her dose of Neurontin to 600 mg in the morning and lunch and then 900 mg at night for her phantom pain.  If this does not work, we could increase the dose slightly more or consider the addition of Lyrica.  For her left leg, we will recheck her perfusion in about 6 months and if that remains stable we can likely go to an annual basis.

## 2019-12-14 NOTE — Assessment & Plan Note (Signed)
lipid control important in reducing the progression of atherosclerotic disease. Continue statin therapy  

## 2019-12-17 ENCOUNTER — Ambulatory Visit: Payer: Medicaid Other | Admitting: Physical Therapy

## 2019-12-17 ENCOUNTER — Encounter: Payer: Self-pay | Admitting: Physical Therapy

## 2019-12-17 ENCOUNTER — Other Ambulatory Visit: Payer: Self-pay

## 2019-12-17 DIAGNOSIS — M6281 Muscle weakness (generalized): Secondary | ICD-10-CM

## 2019-12-17 DIAGNOSIS — M79661 Pain in right lower leg: Secondary | ICD-10-CM

## 2019-12-17 DIAGNOSIS — S88111A Complete traumatic amputation at level between knee and ankle, right lower leg, initial encounter: Secondary | ICD-10-CM

## 2019-12-17 DIAGNOSIS — R2689 Other abnormalities of gait and mobility: Secondary | ICD-10-CM | POA: Diagnosis not present

## 2019-12-17 NOTE — Therapy (Signed)
[facility] [address] [place], [place], [address] Phone: [phone]   Fax:  [phone]  Physical Therapy Treatment  Patient Details  Name: Sylvia [name] MRN: [id number] Date of Birth: [birth year] No data recorded  Encounter Date: 12/17/2019   PT End of Session - 12/17/19 0932    Visit Number 24    Number of Visits 25    Date for PT Re-Evaluation 12/19/19    Authorization - Visit Number 8    Authorization - Number of Visits 10    PT Start Time 0932    PT Stop Time 1030    PT Time Calculation (min) 58 min    Equipment Utilized During Treatment Gait belt    Activity Tolerance Patient tolerated treatment well;Patient limited by pain    Behavior During Therapy WFL for tasks assessed/performed           Past Medical History:  Diagnosis Date  . Anemia of chronic disease 05/16/2019  . Anxiety    h/o  . COPD (chronic obstructive pulmonary disease) (HCC)   . Diabetes mellitus without complication (HCC)   . GERD (gastroesophageal reflux disease)   . Hypertension    bp under control-off meds since 2019    Past Surgical History:  Procedure Laterality Date  . ABDOMINAL HYSTERECTOMY    . AMPUTATION Right 12/27/2018   Procedure: AMPUTATION BELOW KNEE;  Surgeon: Annice Needy, MD;  Location: [facility] OR[facility];  Service: General;  Laterality: Right;  . CHOLECYSTECTOMY    . COLONOSCOPY WITH PROPOFOL N/A 04/16/2019   Procedure: COLONOSCOPY WITH PROPOFOL;  Surgeon: Pasty Spillers, MD;  Location: [facility]OS[facility];  Service: Endoscopy;  Laterality: N/A;  . ESOPHAGOGASTRODUODENOSCOPY (EGD) WITH PROPOFOL N/A 06/15/2018   Procedure: ESOPHAGOGASTRODUODENOSCOPY (EGD) WITH PROPOFOL;  Surgeon: Toledo, Boykin Nearing, MD;  Location: [facility] ENDOS[facility];  Service: Gastroenterology;  Laterality: N/A;  . ESOPHAGOGASTRODUODENOSCOPY (EGD) WITH PROPOFOL N/A 01/03/2019   Procedure: ESOPHAGOGASTRODUODENOSCOPY (EGD) WITH PROPOFOL;  Surgeon: Midge Minium, MD;  Location:  [facility] ENDOSCOPY;  Service: Endoscopy;  Laterality: N/A;  . GIVENS CAPSULE STUDY  04/16/2019   Procedure: GIVENS CAPSULE STUDY;  Surgeon: Pasty Spillers, MD;  Location: [facility]OS[facility];  Service: Endoscopy;;  . GIVENS CAPSULE STUDY N/A 06/25/2019   Procedure: GIVENS CAPSULE STUDY;  Surgeon: Wyline Mood, MD;  Location: [facility] END[facility];  Service: Gastroenterology;  Laterality: N/A;  . LOWER EXTREMITY ANGIOGRAPHY Right 08/21/2018   Procedure: LOWER EXTREMITY ANGIOGRAPHY;  Surgeon: Annice Needy, MD;  Location: [facility];  Service: Cardiovascular;  Laterality: Right;  . LOWER EXTREMITY ANGIOGRAPHY Left 08/28/2018   Procedure: LOWER EXTREMITY ANGIOGRAPHY;  Surgeon: Annice Needy, MD;  Location: [facility];  Service: Cardiovascular;  Laterality: Left;  . LOWER EXTREMITY ANGIOGRAPHY Right 08/28/2018   Procedure: Lower Extremity Angiography;  Surgeon: Annice Needy, MD;  Location: [facility];  Service: Cardiovascular;  Laterality: Right;  . LOWER EXTREMITY ANGIOGRAPHY Right 12/18/2018   Procedure: Lower Extremity Angiography;  Surgeon: Annice Needy, MD;  Location: [facility];  Service: Cardiovascular;  Laterality: Right;  . LOWER EXTREMITY ANGIOGRAPHY Right 12/18/2018   Procedure: Lower Extremity Angiography;  Surgeon: Annice Needy, MD;  Location: [facility];  Service: Cardiovascular;  Laterality: Right;  . LOWER EXTREMITY ANGIOGRAPHY Left 12/21/2018   Procedure: Lower Extremity Angiography;  Surgeon: Annice Needy, MD;  Location: [facility];  Service: Cardiovascular;  Laterality: Left;  . LOWER EXTREMITY ANGIOGRAPHY Right 12/21/2018  Procedure: Lower Extremity Angiography;  Surgeon: Annice Needy, MD;  Location: [facility];  Service: Cardiovascular;  Laterality: Right;  . LOWER EXTREMITY INTERVENTION N/A 12/22/2018   Procedure: LOWER EXTREMITY INTERVENTION;  Surgeon: Annice Needy, MD;  Location: [facility];  Service: Cardiovascular;   Laterality: N/A;    There were no vitals filed for this visit.   Subjective Assessment - 12/17/19 0935    Subjective Pt. states that she had a busy weekend cleaning the entire house, spent time cleaning bathroom out on her knees. Pt. states that her R knee is very tender, 8/10 tenderness over R lateral aspect of residual limb.    Pertinent History pt fell 6x before she got her prosthesis; 12/20/18 pt had below knee ampulation due to infection/gangrene    Limitations Sitting;Standing;Walking    How long can you sit comfortably? 1 hour    How long can you stand comfortably? 10 minutes (with prosthesis)    How long can you walk comfortably? A few minutes with some short rest breaks in standing    Patient Stated Goals to be able to do 7-8 steps to get in her home.  She has a ramp she can use now; to be able to walk again without AD.  She wants to be able to stand 1.5 hrs for cooking in her kitchen.    Currently in Pain? Yes    Pain Score 8     Pain Location Leg    Pain Orientation Right;Lateral    Pain Descriptors / Indicators Tender    Pain Type Acute pain    Pain Onset In the past 7 days            Therex:   Seated icing on residual limb: 10 mins. Pt discussed goals with therapist as well as discharge planning over next session  Lifting technique with box: Pt instructed on proper squat to lift as well as row to bring box to chest before standing. Pt able to complete multiple reps with 10lb box, able to complete up to 2 reps with 20lb box.   Nustep L2 10 mins.   Neuro Re-ed:  Star balance standing on R leg. Pt completed 3x around the star, min-mod cueing to maintain R weightshift throughout L leg reach. Pt required CGA throughout.   Outdoor walking: up and down hill on grass with no cane. Pt able to maintain balance with cueing to lean forward up the hill, and lean back slightly going down the hill. Pt additionally cued to decrease habitual antalgic gait. Pt required CGA, pt  experienced no LOB.      PT Short Term Goals - 11/21/19 1419      PT SHORT TERM GOAL #1   Title Improve R knee extension AROM to 0 degrees to promote optimal gait mechanics with prosthesis    Baseline -10 degrees, 6/21: -2 degrees.   7/6: R quad lag -8 deg.  8/4: 0 deg. noted during gait.    Time 4    Period Weeks    Status Achieved    Target Date 11/21/19      PT SHORT TERM GOAL #2   Title Pt will be able to tolerate 20 min standing interventions without requiring sitting rest break    Baseline 3 min, 6/21: able to perform with her FWW or parallel bars    Time 4    Period Weeks    Status Achieved    Target Date 10/23/19  PT Long Term Goals - 11/21/19 0945      PT LONG TERM GOAL #1   Title Pt will be able to ascend/descend 8 stairs independently with prosthesis with reciprocal pattern.    Baseline unable, 6/21: pt able to ascend 1 8-inch step in parallel bars with bilateral railing using L LE to ascend and R to descend; 10/23/19: able to ascend/descend 4 steps with reciprocal pattern, limited by residual limb pain/pressure.; 11/20/19: pt able to ascend/descend 8 stairs with half reciprocal pattern. safe step to pattern with cane    Time 4    Period Weeks    Status Revised    Target Date 12/19/19      PT LONG TERM GOAL #2   Title Pt will be able to stand x 1 hour wearing prosthesis with no rest break for cooking full meal in her kitchen    Baseline 6/21: pt standing to cook but requires a chair to sit and cook from after 10-15 min.; 8/3: pt able to stand for about 30 mins, limited by pain    Time 6    Period Weeks    Status Revised    Target Date 12/19/19      PT LONG TERM GOAL #3   Title Improve FOTO score to >45/52 indicating improved overall activity tolerance    Baseline 31/52, 6/21: attempted to do FOTO but website not accessible- will attempt again at next visit; 10/23/2019: 33/52; 8/3:59/52    Time 6    Period Weeks    Status Achieved    Target Date  11/21/19      PT LONG TERM GOAL #4   Title Pt will return to driving short distances (78-93 minutes) for independent community mobility for grocery shopping and doctor's appointments    Baseline 6/21: not driving. 8/10: pt. not driving at this time; 8/3: pt is able to now drive around a parking lot as long as she's wearing lighter weight sneakers.    Time 4    Period Weeks    Status On-going    Target Date 12/19/19      PT LONG TERM GOAL #5   Title Pt. will be able to walk at least 150 feet with no AD with supervision to improve independence.    Baseline 8/3: pt can walk about 150 feet with CGA and SPC    Time 4    Period Weeks    Status New    Target Date 12/19/19                 Plan - 12/17/19 1023    Clinical Impression Statement Pt. demonstrates improved ability to ambulate outdoors with being able to walk up/down grassy hill with no LOB. Pt. required CGA, however no LOB experienced. Pt. instructed to use cane going up/down hills currently for safety if by herself. Pt. demonstrates good lifting technique for up to 20lb box. Pt. still demonstrates difficulty with maintaining R weightshift during SLS, however it is improving. Pt. will continue to benefit from skilled PT to improve balance and ability to complete ADLs independently.    Personal Factors and Comorbidities Comorbidity 2    Comorbidities see med hx    Examination-Activity Limitations Locomotion Level;Sit;Squat;Stand    Examination-Participation Restrictions Meal Prep;Community Activity;Driving    Stability/Clinical Decision Making Evolving/Moderate complexity    Clinical Decision Making Moderate    Rehab Potential Good    PT Frequency 2x / week    PT Duration 4 weeks  PT Treatment/Interventions ADLs/Self Care Home Management;Neuromuscular re-education;Balance training;Therapeutic exercise;Therapeutic activities;Functional mobility training;Stair training;Gait training;Patient/family education;Prosthetic  Training;Manual techniques;Energy conservation;Passive range of motion    PT Next Visit Plan Gait training without SPC. R SLS on prosthesis no SPC.    PT Home Exercise Plan [id number]    Consulted and Agree with Plan of Care Patient           Patient will benefit from skilled therapeutic intervention in order to improve the following deficits and impairments:  Abnormal gait, Decreased coordination, Decreased range of motion, Difficulty walking, Prosthetic Dependency, Decreased endurance, Decreased activity tolerance, Decreased knowledge of precautions, Impaired perceived functional ability, Pain, Decreased balance, Impaired flexibility, Postural dysfunction, Decreased strength, Decreased mobility  Visit Diagnosis: Below-knee amputation of right lower extremity (HCC)  Other abnormalities of gait and mobility  Muscle weakness (generalized)  Pain in right lower leg     Problem List Patient Active Problem List   Diagnosis Date Noted  . Cocaine abuse (HCC) 07/26/2019  . Diabetes mellitus without complication (HCC)   . GERD (gastroesophageal reflux disease)   . COPD (chronic obstructive pulmonary disease) (HCC)   . GIB (gastrointestinal bleeding)   . HLD (hyperlipidemia)   . Depression   . Elevated troponin   . Syncope   . Elevated troponin I level   . Anemia of chronic disease 05/16/2019  . Dizziness   . Polyp of colon   . Acute upper GI bleeding 04/13/2019  . Anemia associated with acute blood loss 04/13/2019  . Chronic anticoagulation 04/13/2019  . 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 Symptomatic anemia 04/13/2019  . Upper GI bleed 04/13/2019  . Acute GI bleeding 04/13/2019  . 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 Melena   . DU (duodenal ulcer)   . DKA (diabetic ketoacidoses) (HCC) 12/14/2018  . Atherosclerosis of artery of extremity with rest pain (HCC) 08/28/2018  . PAD (peripheral artery disease) (HCC) 07/14/2018  . Abdominal pain  06/13/2018  . HTN (hypertension) 06/13/2018  . Non-insulin dependent type 2 diabetes mellitus (HCC) 06/13/2018  . Elevated lactic acid level 06/13/2018   Cammie Mcgee, PT, DPT # [id number] Sharyn Creamer, SPT 12/18/2019, 7:46 AM  [facility] [address] [place], [place], [address] Phone: [phone]   Fax:  [phone]  Name: Sylvia [name] MRN: [id number] Date of Birth: 02/21/1956

## 2019-12-19 ENCOUNTER — Ambulatory Visit: Payer: Medicare HMO | Attending: Vascular Surgery | Admitting: Physical Therapy

## 2019-12-19 ENCOUNTER — Other Ambulatory Visit: Payer: Self-pay

## 2019-12-19 ENCOUNTER — Encounter: Payer: Self-pay | Admitting: Physical Therapy

## 2019-12-19 DIAGNOSIS — R2689 Other abnormalities of gait and mobility: Secondary | ICD-10-CM | POA: Diagnosis not present

## 2019-12-19 DIAGNOSIS — M6281 Muscle weakness (generalized): Secondary | ICD-10-CM | POA: Insufficient documentation

## 2019-12-19 DIAGNOSIS — S88111A Complete traumatic amputation at level between knee and ankle, right lower leg, initial encounter: Secondary | ICD-10-CM | POA: Diagnosis present

## 2019-12-19 DIAGNOSIS — M79661 Pain in right lower leg: Secondary | ICD-10-CM | POA: Insufficient documentation

## 2019-12-19 NOTE — Therapy (Signed)
[facility] [address] [place], [place], [address] Phone: [phone]   Fax:  [phone]  Physical Therapy Treatment  Patient Details  Name: Sylvia [name] MRN: [id number] Date of Birth: 1955-11-25 No data recorded  Encounter Date: 12/19/2019   PT End of Session - 12/19/19 1028    Visit Number 25    Number of Visits 25    Date for PT Re-Evaluation 12/19/19    Authorization - Visit Number 9    Authorization - Number of Visits 10    PT Start Time 0920    PT Stop Time 1018    PT Time Calculation (min) 58 min    Equipment Utilized During Treatment Gait belt    Activity Tolerance Patient tolerated treatment well;Patient limited by pain    Behavior During Therapy WFL for tasks assessed/performed           Past Medical History:  Diagnosis Date  . Anemia of chronic disease 05/16/2019  . Anxiety    h/o  . COPD (chronic obstructive pulmonary disease) ([place])   . Diabetes mellitus without complication ([place])   . GERD (gastroesophageal reflux disease)   . Hypertension    bp under control-off meds since 2019    Past Surgical History:  Procedure Laterality Date  . ABDOMINAL HYSTERECTOMY    . AMPUTATION Right 12/27/2018   Procedure: AMPUTATION BELOW KNEE;  Surgeon: Algernon Huxley, MD;  Location: [facility] ORS;  Service: General;  Laterality: Right;  . CHOLECYSTECTOMY    . COLONOSCOPY WITH PROPOFOL N/A 04/16/2019   Procedure: COLONOSCOPY WITH PROPOFOL;  Surgeon: Virgel Manifold, MD;  Location: [facility] ENDOSCOPY;  Service: Endoscopy;  Laterality: N/A;  . ESOPHAGOGASTRODUODENOSCOPY (EGD) WITH PROPOFOL N/A 06/15/2018   Procedure: ESOPHAGOGASTRODUODENOSCOPY (EGD) WITH PROPOFOL;  Surgeon: Toledo, Benay Pike, MD;  Location: [facility] ENDOSCOPY;  Service: Gastroenterology;  Laterality: N/A;  . ESOPHAGOGASTRODUODENOSCOPY (EGD) WITH PROPOFOL N/A 01/03/2019   Procedure: ESOPHAGOGASTRODUODENOSCOPY (EGD) WITH PROPOFOL;  Surgeon: Lucilla Lame, MD;  Location:  [facility] ENDOSCOPY;  Service: Endoscopy;  Laterality: N/A;  . GIVENS CAPSULE STUDY  04/16/2019   Procedure: GIVENS CAPSULE STUDY;  Surgeon: Virgel Manifold, MD;  Location: [facility] ENDOSCOPY;  Service: Endoscopy;;  . GIVENS CAPSULE STUDY N/A 06/25/2019   Procedure: GIVENS CAPSULE STUDY;  Surgeon: Jonathon Bellows, MD;  Location: [facility] ENDOSCOPY;  Service: Gastroenterology;  Laterality: N/A;  . LOWER EXTREMITY ANGIOGRAPHY Right 08/21/2018   Procedure: LOWER EXTREMITY ANGIOGRAPHY;  Surgeon: Algernon Huxley, MD;  Location: [place] CV LAB;  Service: Cardiovascular;  Laterality: Right;  . LOWER EXTREMITY ANGIOGRAPHY Left 08/28/2018   Procedure: LOWER EXTREMITY ANGIOGRAPHY;  Surgeon: Algernon Huxley, MD;  Location: [place] CV LAB;  Service: Cardiovascular;  Laterality: Left;  . LOWER EXTREMITY ANGIOGRAPHY Right 08/28/2018   Procedure: Lower Extremity Angiography;  Surgeon: Algernon Huxley, MD;  Location: [place] CV LAB;  Service: Cardiovascular;  Laterality: Right;  . LOWER EXTREMITY ANGIOGRAPHY Right 12/18/2018   Procedure: Lower Extremity Angiography;  Surgeon: Algernon Huxley, MD;  Location: [place] CV LAB;  Service: Cardiovascular;  Laterality: Right;  . LOWER EXTREMITY ANGIOGRAPHY Right 12/18/2018   Procedure: Lower Extremity Angiography;  Surgeon: Algernon Huxley, MD;  Location: [place] CV LAB;  Service: Cardiovascular;  Laterality: Right;  . LOWER EXTREMITY ANGIOGRAPHY Left 12/21/2018   Procedure: Lower Extremity Angiography;  Surgeon: Algernon Huxley, MD;  Location: [place] CV LAB;  Service: Cardiovascular;  Laterality: Left;  . LOWER EXTREMITY ANGIOGRAPHY Right 12/21/2018  Procedure: Lower Extremity Angiography;  Surgeon: Annice Needy, MD;  Location: [facility] INV[facility] CV LAB;  Service: Cardiovascular;  Laterality: Right;  . LOWER EXTREMITY INTERVENTION N/A 12/22/2018   Procedure: LOWER EXTREMITY INTERVENTION;  Surgeon: Annice Needy, MD;  Location: [facility] INVASIVE CV LAB;  Service: Cardiovascular;   Laterality: N/A;    There were no vitals filed for this visit.   Subjective Assessment - 12/19/19 0924    Subjective Pt. states that she has not been able to practice driving. Pt. reports no pain and no falls.    Pertinent History pt fell 6x before she got her prosthesis; 12/20/18 pt had below knee ampulation due to infection/gangrene    Limitations Sitting;Standing;Walking    How long can you sit comfortably? 1 hour    How long can you stand comfortably? 10 minutes (with prosthesis)    How long can you walk comfortably? A few minutes with some short rest breaks in standing    Patient Stated Goals to be able to do 7-8 steps to get in her home.  She has a ramp she can use now; to be able to walk again without AD.  She wants to be able to stand 1.5 hrs for cooking in her kitchen.    Pain Onset In the past 7 days             There Ex:  Nustep L3 10 mins  Neuro re-ed:  Walking in hallway: approx 250 feet with no AD, pt cued for decreasing   Stair training: pt able to practice reciprocal pattern up and down the stairs. Pt completed 8x on stairs in clinic, ascending/descending. Pt safer with step to pattern with descending. Cueing for improved knee positioning when ascending/descending to decrease knee valgus.   Walking with unexpected cues to lateral step: pt described desire to walk on street to get from home to store up the road where there is no sidewalk. Pt practiced walking with sudden cues to move out of the way to simulate needing to quickly move for cars. Pt able to move quickly out of the way safely and independently with no AD to both directions.        PT Short Term Goals - 11/21/19 1419      PT SHORT TERM GOAL #1   Title Improve R knee extension AROM to 0 degrees to promote optimal gait mechanics with prosthesis    Baseline -10 degrees, 6/21: -2 degrees.   7/6: R quad lag -8 deg.  8/4: 0 deg. noted during gait.    Time 4    Period Weeks    Status Achieved    Target  Date 11/21/19      PT SHORT TERM GOAL #2   Title Pt will be able to tolerate 20 min standing interventions without requiring sitting rest break    Baseline 3 min, 6/21: able to perform with her FWW or parallel bars    Time 4    Period Weeks    Status Achieved    Target Date 10/23/19             PT Long Term Goals - 12/19/19 0934      PT LONG TERM GOAL #1   Title Pt will be able to ascend/descend 8 stairs independently with prosthesis with reciprocal pattern.    Baseline unable, 6/21: pt able to ascend 1 8-inch step in parallel bars with bilateral railing using L LE to ascend and R to descend; 10/23/19: able to  ascend/descend 4 steps with reciprocal pattern, limited by residual limb pain/pressure.; 11/20/19: pt able to ascend/descend 8 stairs with half reciprocal pattern. safe step to pattern with cane. 9/1: pt able to ascend 8 stairs reciproically, pt is able to descend reciprocally with cane/rail, however safer with step to pattern for descending.    Time 4    Period Weeks    Status Achieved    Target Date 12/19/19      PT LONG TERM GOAL #2   Title Pt will be able to stand x 1 hour wearing prosthesis with no rest break for cooking full meal in her kitchen    Baseline 6/21: pt standing to cook but requires a chair to sit and cook from after 10-15 min.; 8/3: pt able to stand for about 30 mins, limited by pain; 9/1: pt able to stand for about 45 mins, limited by fatigue    Time 6    Period Weeks    Status Partially Met    Target Date 12/19/19      PT LONG TERM GOAL #3   Title Improve FOTO score to >45/52 indicating improved overall activity tolerance    Baseline 31/52, 6/21: attempted to do FOTO but website not accessible- will attempt again at next visit; 10/23/2019: 33/52; 8/3:59/52    Time 6    Period Weeks    Status Achieved    Target Date 11/21/19      PT LONG TERM GOAL #4   Title Pt will return to driving short distances (20-30 minutes) for independent community mobility for  grocery shopping and doctor's appointments    Baseline 6/21: not driving. 1/61: pt. not driving at this time; 8/3: pt is able to now drive around a parking lot as long as she's wearing lighter weight sneakers. 9/1: Pt still is in parking lot, but is feeling more confident and is ready to start driving on roads.    Time 4    Period Weeks    Status Partially Met    Target Date 12/19/19      PT LONG TERM GOAL #5   Title Pt. will be able to walk at least 150 feet with no AD with supervision to improve independence.    Baseline 8/3: pt can walk about 150 feet with CGA and SPC. 9/1: pt able to ambulate 250 feet with no AD, limited by fatigue.    Time 4    Period Weeks    Status Achieved    Target Date 12/19/19              Plan - 12/19/19 1038    Clinical Impression Statement Pt. has made significant gains in therapy since begninning. She has met almost all of her goals, with the exception of driving which she will progress at home. Pt. has demonstrated that she can safely ambulate wtih no AD, can ascend/descend stairs independently with or without cane/railings (is most safe utilizing step to pattern with ascend/descend, however can complete with reciprocal pattern), and can stand for extended periods of time. PT discussed community based fitness programs with pt, pt verbalized that this is something she would be interested in completing. Pt. scheduled to return in 2 weeks for final follow up. Pt instructed to contact PT clinic in case of any issues or regression in condition.    Personal Factors and Comorbidities Comorbidity 2    Comorbidities see med hx    Examination-Activity Limitations Locomotion Level;Sit;Squat;Stand    Examination-Participation Restrictions Meal Prep;Community Activity;Driving  Stability/Clinical Decision Making Evolving/Moderate complexity    Clinical Decision Making Moderate    Rehab Potential Good    PT Frequency 2x / week    PT Duration 4 weeks    PT  Treatment/Interventions ADLs/Self Care Home Management;Neuromuscular re-education;Balance training;Therapeutic exercise;Therapeutic activities;Functional mobility training;Stair training;Gait training;Patient/family education;Prosthetic Training;Manual techniques;Energy conservation;Passive range of motion    PT Next Visit Plan Discharge visit    PT Home Exercise Plan [id number]    Consulted and Agree with Plan of Care Patient           Patient will benefit from skilled therapeutic intervention in order to improve the following deficits and impairments:  Abnormal gait, Decreased coordination, Decreased range of motion, Difficulty walking, Prosthetic Dependency, Decreased endurance, Decreased activity tolerance, Decreased knowledge of precautions, Impaired perceived functional ability, Pain, Decreased balance, Impaired flexibility, Postural dysfunction, Decreased strength, Decreased mobility  Visit Diagnosis: Other abnormalities of gait and mobility  Below-knee amputation of right lower extremity (HCC)  Muscle weakness (generalized)  Pain in right lower leg     Problem List Patient Active Problem List   Diagnosis Date Noted  . Cocaine abuse ([place]) 07/26/2019  . Diabetes mellitus without complication ([place])   . GERD (gastroesophageal reflux disease)   . COPD (chronic obstructive pulmonary disease) ([place])   . GIB (gastrointestinal bleeding)   . HLD (hyperlipidemia)   . Depression   . Elevated troponin   . Syncope   . Elevated troponin I level   . Anemia of chronic disease 05/16/2019  . Dizziness   . Polyp of colon   . Acute upper GI bleeding 04/13/2019  . Anemia associated with acute blood loss 04/13/2019  . Chronic anticoagulation 04/13/2019  . Hx of right BKA ([place]) 04/13/2019  . Symptomatic anemia 04/13/2019  . Upper GI bleed 04/13/2019  . Acute GI bleeding 04/13/2019  . Phantom pain after amputation of lower extremity ([place]) 01/30/2019  . Melena   . DU (duodenal ulcer)   .  DKA (diabetic ketoacidoses) ([place]) 12/14/2018  . Atherosclerosis of artery of extremity with rest pain ([place]) 08/28/2018  . PAD (peripheral artery disease) ([place]) 07/14/2018  . Abdominal pain 06/13/2018  . HTN (hypertension) 06/13/2018  . Non-insulin dependent type 2 diabetes mellitus ([place]) 06/13/2018  . Elevated lactic acid level 06/13/2018   Pura Spice, PT, DPT # [id number] Carlyle Basques, SPT 12/19/2019, 2:00 PM  [facility] [address] [place], [place], [address] Phone: [phone]   Fax:  [phone]  Name: Mistee Soliman MRN: [id number] Date of Birth: Jun 22, 1955

## 2019-12-26 ENCOUNTER — Other Ambulatory Visit: Payer: Self-pay

## 2019-12-26 ENCOUNTER — Other Ambulatory Visit: Payer: Medicare HMO

## 2019-12-26 DIAGNOSIS — Z20822 Contact with and (suspected) exposure to covid-19: Secondary | ICD-10-CM

## 2019-12-28 LAB — NOVEL CORONAVIRUS, NAA: SARS-CoV-2, NAA: NOT DETECTED

## 2019-12-28 LAB — SARS-COV-2, NAA 2 DAY TAT

## 2019-12-31 ENCOUNTER — Ambulatory Visit: Payer: Medicare HMO | Admitting: Physical Therapy

## 2020-01-03 ENCOUNTER — Other Ambulatory Visit (INDEPENDENT_AMBULATORY_CARE_PROVIDER_SITE_OTHER): Payer: Self-pay | Admitting: Nurse Practitioner

## 2020-01-07 ENCOUNTER — Telehealth: Payer: Self-pay

## 2020-01-07 NOTE — Telephone Encounter (Signed)
We received vascular clearance from Dr. Wyn Quaker. This will be scanned in patient's chart under Media. Patient is to hold her Eliquis 3 days prior and start evening of. Patient will be notified about her Eliquis.

## 2020-01-07 NOTE — Telephone Encounter (Signed)
-----   Message from [place], [place] sent at 11/15/2019  4:39 PM EDT ----- Regarding: Schedule colonoscopy Send a cardiac/medical clearance to patient's PCP so we could schedule her colonoscopy.

## 2020-01-09 ENCOUNTER — Other Ambulatory Visit: Payer: Self-pay

## 2020-01-09 DIAGNOSIS — Z86010 Personal history of colonic polyps: Secondary | ICD-10-CM

## 2020-01-09 MED ORDER — NA SULFATE-K SULFATE-MG SULF 17.5-3.13-1.6 GM/177ML PO SOLN: ORAL | 0 refills | Status: DC

## 2020-01-09 NOTE — Telephone Encounter (Signed)
Called patient to schedule her colonoscopy but had to leave her a voicemail to return my call.

## 2020-01-09 NOTE — Telephone Encounter (Signed)
Patient called back and I was able to schedule her colonoscopy for 01/16/2020 at [facility]. I told her what she needed to do for her Eliquis as per Dr. Wyn Quaker. Instruction will be sent through MyChart per patient's request.

## 2020-01-14 ENCOUNTER — Other Ambulatory Visit
Admission: RE | Admit: 2020-01-14 | Discharge: 2020-01-14 | Disposition: A | Discharge status: Home / Self Care | Payer: Medicare HMO | Source: Ambulatory Visit | Attending: Gastroenterology | Admitting: Gastroenterology

## 2020-01-14 ENCOUNTER — Other Ambulatory Visit: Payer: Self-pay

## 2020-01-14 DIAGNOSIS — Z20822 Contact with and (suspected) exposure to covid-19: Secondary | ICD-10-CM | POA: Insufficient documentation

## 2020-01-14 DIAGNOSIS — Z01812 Encounter for preprocedural laboratory examination: Secondary | ICD-10-CM | POA: Diagnosis present

## 2020-01-14 LAB — SARS CORONAVIRUS 2 (TAT 6-24 HRS): SARS Coronavirus 2: NEGATIVE

## 2020-01-16 ENCOUNTER — Encounter
Admission: RE | Disposition: A | Discharge status: Home / Self Care | Payer: Self-pay | Source: Home / Self Care | Attending: Gastroenterology

## 2020-01-16 ENCOUNTER — Encounter: Payer: Self-pay | Admitting: Gastroenterology

## 2020-01-16 ENCOUNTER — Ambulatory Visit
Admission: RE | Admit: 2020-01-16 | Discharge: 2020-01-16 | Disposition: A | Discharge status: Home / Self Care | Payer: Medicare HMO | Attending: Gastroenterology | Admitting: Gastroenterology

## 2020-01-16 ENCOUNTER — Ambulatory Visit: Payer: Medicare HMO | Admitting: Certified Registered Nurse Anesthetist

## 2020-01-16 DIAGNOSIS — Z7984 Long term (current) use of oral hypoglycemic drugs: Secondary | ICD-10-CM | POA: Diagnosis not present

## 2020-01-16 DIAGNOSIS — Z87891 Personal history of nicotine dependence: Secondary | ICD-10-CM | POA: Diagnosis not present

## 2020-01-16 DIAGNOSIS — Z7902 Long term (current) use of antithrombotics/antiplatelets: Secondary | ICD-10-CM | POA: Diagnosis not present

## 2020-01-16 DIAGNOSIS — Z888 Allergy status to other drugs, medicaments and biological substances status: Secondary | ICD-10-CM | POA: Diagnosis not present

## 2020-01-16 DIAGNOSIS — Z86010 Personal history of colonic polyps: Secondary | ICD-10-CM | POA: Insufficient documentation

## 2020-01-16 DIAGNOSIS — Z538 Procedure and treatment not carried out for other reasons: Secondary | ICD-10-CM | POA: Diagnosis not present

## 2020-01-16 DIAGNOSIS — Z7901 Long term (current) use of anticoagulants: Secondary | ICD-10-CM | POA: Insufficient documentation

## 2020-01-16 DIAGNOSIS — Z1211 Encounter for screening for malignant neoplasm of colon: Secondary | ICD-10-CM | POA: Insufficient documentation

## 2020-01-16 DIAGNOSIS — Z803 Family history of malignant neoplasm of breast: Secondary | ICD-10-CM | POA: Insufficient documentation

## 2020-01-16 DIAGNOSIS — Z885 Allergy status to narcotic agent status: Secondary | ICD-10-CM | POA: Insufficient documentation

## 2020-01-16 DIAGNOSIS — Z79899 Other long term (current) drug therapy: Secondary | ICD-10-CM | POA: Insufficient documentation

## 2020-01-16 DIAGNOSIS — Z880 Allergy status to penicillin: Secondary | ICD-10-CM | POA: Insufficient documentation

## 2020-01-16 HISTORY — PX: COLONOSCOPY WITH PROPOFOL: SHX5780

## 2020-01-16 LAB — URINE DRUG SCREEN, QUALITATIVE (ARMC ONLY)
Amphetamines, Ur Screen: NOT DETECTED
Barbiturates, Ur Screen: NOT DETECTED
Benzodiazepine, Ur Scrn: NOT DETECTED
Cannabinoid 50 Ng, Ur ~~LOC~~: NOT DETECTED
Cocaine Metabolite,Ur ~~LOC~~: NOT DETECTED
MDMA (Ecstasy)Ur Screen: NOT DETECTED
Methadone Scn, Ur: NOT DETECTED
Opiate, Ur Screen: NOT DETECTED
Phencyclidine (PCP) Ur S: NOT DETECTED
Tricyclic, Ur Screen: NOT DETECTED

## 2020-01-16 SURGERY — COLONOSCOPY WITH PROPOFOL
Anesthesia: General

## 2020-01-16 MED ORDER — PROPOFOL 500 MG/50ML IV EMUL
INTRAVENOUS | Status: DC | PRN
  Administered 2020-01-16: 130 ug/kg/min via INTRAVENOUS

## 2020-01-16 MED ORDER — PROPOFOL 500 MG/50ML IV EMUL
INTRAVENOUS | Status: AC
  Filled 2020-01-16: qty 50

## 2020-01-16 MED ORDER — PROPOFOL 10 MG/ML IV BOLUS
INTRAVENOUS | Status: AC
  Filled 2020-01-16: qty 20

## 2020-01-16 MED ORDER — SODIUM CHLORIDE 0.9 % IV SOLN: INTRAVENOUS | Status: DC

## 2020-01-16 MED ORDER — PROPOFOL 10 MG/ML IV BOLUS
INTRAVENOUS | Status: DC | PRN
  Administered 2020-01-16: 40 mg via INTRAVENOUS
  Administered 2020-01-16 (×2): 19 mg via INTRAVENOUS
  Administered 2020-01-16: 80 mg via INTRAVENOUS
  Administered 2020-01-16: 40 mg via INTRAVENOUS

## 2020-01-16 MED ORDER — LIDOCAINE HCL (CARDIAC) PF 100 MG/5ML IV SOSY
PREFILLED_SYRINGE | INTRAVENOUS | Status: DC | PRN
  Administered 2020-01-16: 50 mg via INTRAVENOUS

## 2020-01-16 NOTE — H&P (Addendum)
[name], MD [address], [place], [place], [address] [address], [place], [place], [address] Phone: [phone]  Fax: [phone]  Primary Care Physician:  [facility]   Pre-Procedure History & Physical: HPI:  Sylvia [name] is a 64 y.o. female is here for a colonoscopy.   Past Medical History:  Diagnosis Date  . Anemia of chronic disease 05/16/2019  . Anxiety    h/o  . COPD (chronic obstructive pulmonary disease) (HCC)   . Diabetes mellitus without complication (HCC)   . GERD (gastroesophageal reflux disease)   . Hypertension    bp under control-off meds since 2019    Past Surgical History:  Procedure Laterality Date  . ABDOMINAL HYSTERECTOMY    . AMPUTATION Right 12/27/2018   Procedure: AMPUTATION BELOW KNEE;  Surgeon: Annice Needy, MD;  Location: [facility] ORS;  Service: General;  Laterality: Right;  . CHOLECYSTECTOMY    . COLONOSCOPY WITH PROPOFOL N/A 04/16/2019   Procedure: COLONOSCOPY WITH PROPOFOL;  Surgeon: Pasty Spillers, MD;  Location: [facility] ENDOSCOPY;  Service: Endoscopy;  Laterality: N/A;  . ESOPHAGOGASTRODUODENOSCOPY (EGD) WITH PROPOFOL N/A 06/15/2018   Procedure: ESOPHAGOGASTRODUODENOSCOPY (EGD) WITH PROPOFOL;  Surgeon: Toledo, Boykin Nearing, MD;  Location: [facility] ENDOSCOPY;  Service: Gastroenterology;  Laterality: N/A;  . ESOPHAGOGASTRODUODENOSCOPY (EGD) WITH PROPOFOL N/A 01/03/2019   Procedure: ESOPHAGOGASTRODUODENOSCOPY (EGD) WITH PROPOFOL;  Surgeon: Midge Minium, MD;  Location: [facility] ENDOSCOPY;  Service: Endoscopy;  Laterality: N/A;  . GIVENS CAPSULE STUDY  04/16/2019   Procedure: GIVENS CAPSULE STUDY;  Surgeon: Pasty Spillers, MD;  Location: [facility] ENDOSCOPY;  Service: Endoscopy;;  . GIVENS CAPSULE STUDY N/A 06/25/2019   Procedure: GIVENS CAPSULE STUDY;  Surgeon: Wyline Mood, MD;  Location: [facility] ENDOSCOPY;  Service: Gastroenterology;  Laterality: N/A;  . LOWER EXTREMITY ANGIOGRAPHY Right 08/21/2018    Procedure: LOWER EXTREMITY ANGIOGRAPHY;  Surgeon: Annice Needy, MD;  Location: [facility] INV[facility] CV LAB;  Service: Cardiovascular;  Laterality: Right;  . LOWER EXTREMITY ANGIOGRAPHY Left 08/28/2018   Procedure: LOWER EXTREMITY ANGIOGRAPHY;  Surgeon: Annice Needy, MD;  Location: [facility] LAB;  Service: Cardiovascular;  Laterality: Left;  . LOWER EXTREMITY ANGIOGRAPHY Right 08/28/2018   Procedure: Lower Extremity Angiography;  Surgeon: Annice Needy, MD;  Location: [facility] INV[facility] LAB;  Service: Cardiovascular;  Laterality: Right;  . LOWER EXTREMITY ANGIOGRAPHY Right 12/18/2018   Procedure: Lower Extremity Angiography;  Surgeon: Annice Needy, MD;  Location: [facility] LAB;  Service: Cardiovascular;  Laterality: Right;  . LOWER EXTREMITY ANGIOGRAPHY Right 12/18/2018   Procedure: Lower Extremity Angiography;  Surgeon: Annice Needy, MD;  Location: [facility] LAB;  Service: Cardiovascular;  Laterality: Right;  . LOWER EXTREMITY ANGIOGRAPHY Left 12/21/2018   Procedure: Lower Extremity Angiography;  Surgeon: Annice Needy, MD;  Location: [facility] LAB;  Service: Cardiovascular;  Laterality: Left;  . LOWER EXTREMITY ANGIOGRAPHY Right 12/21/2018   Procedure: Lower Extremity Angiography;  Surgeon: Annice Needy, MD;  Location: [facility] LAB;  Service: Cardiovascular;  Laterality: Right;  . LOWER EXTREMITY INTERVENTION N/A 12/22/2018   Procedure: LOWER EXTREMITY INTERVENTION;  Surgeon: Annice Needy, MD;  Location: [facility] IN[facility] LAB;  Service: Cardiovascular;  Laterality: N/A;    Prior to Admission medications   Medication Sig Start Date End Date Taking? Authorizing Provider  apixaban (ELIQUIS) 2.5 MG TABS tablet Take 1 tablet (2.5 mg total) by mouth 2 (two) times daily. 01/30/19  Yes Georgiana Spinner, NP  clopidogrel (PLAVIX) 75 MG tablet Take 75 mg by mouth daily.   Yes [provider]  acetaminophen (TYLENOL) 325 MG tablet Take 650 mg by mouth every 6 (six) hours as needed.     [provider]  albuterol (PROVENTIL HFA;VENTOLIN HFA) 108 (90 Base) MCG/ACT inhaler Inhale 2 puffs into the lungs every 6 (six) hours as needed for wheezing or shortness of breath. 08/20/17   Jeanmarie Plant, MD  atorvastatin (LIPITOR) 10 MG tablet Take 1 tablet by mouth daily. 08/21/18   [provider]  cetirizine (ZYRTEC) 10 MG tablet Take 10 mg by mouth daily.    [provider]  docusate sodium (COLACE) 100 MG capsule Take 100 mg by mouth 2 (two) times daily.    [provider]  DULoxetine (CYMBALTA) 30 MG capsule Take 30 mg by mouth daily.    [provider]  famotidine (PEPCID) 20 MG tablet Take 1 tablet (20 mg total) by mouth 2 (two) times daily. 10/25/19 11/24/19  Pasty Spillers, MD  ferrous sulfate 325 (65 FE) MG tablet Take 325 mg by mouth daily.     [provider]  gabapentin (NEURONTIN) 300 MG capsule Take 2 Capsules (600 mg) at Bedtime and 1 capsule (300 mg) in the afternoon 06/14/19   Georgiana Spinner, NP  lisinopril (ZESTRIL) 10 MG tablet Take 1 tablet (10 mg total) by mouth daily. 07/26/19   Arnetha Courser, MD  Melatonin 1 MG TABS Take 1 tablet by mouth at bedtime.    [provider]  Multiple Vitamins-Minerals (WOMENS MULTIVITAMIN PO) Take 1 tablet by mouth daily.    [provider]  Na Sulfate-K Sulfate-Mg Sulf 17.5-3.13-1.6 GM/177ML SOLN At 5 PM the day before procedure take 1 bottle and 5 hours before procedure take 1 bottle. 01/09/20   Pasty Spillers, MD  polyethylene glycol-electrolytes (NULYTELY) 420 g solution Prepare according to package instructions. Starting at 5:00 PM: Drink one 8 oz glass of mixture every 15 minutes until you finish half of the jug. Five hours prior to procedure, drink 8 oz glass of mixture every 15 minutes until it is all gone. Make sure you do not drink anything 4 hours prior to your procedure. 10/25/19   Pasty Spillers, MD  rosuvastatin (CRESTOR) 5 MG tablet Take 1 tablet (5  mg total) by mouth daily at 6 PM. 07/26/19   Arnetha Courser, MD  sitaGLIPtin (JANUVIA) 100 MG tablet Take 100 mg by mouth daily.    [provider]  vitamin B-12 (CYANOCOBALAMIN) 500 MCG tablet Take 1 tablet (500 mcg total) by mouth daily. 11/26/19   Rickard Patience, MD    Allergies as of 01/10/2020 - Review Complete 12/19/2019  Allergen Reaction Noted  . Metformin and related Diarrhea 06/04/2019  . Penicillins Hives 11/14/2015  . Tramadol Itching 12/14/2018    Family History  Problem Relation Age of Onset  . Breast cancer Mother 45    Social History   Socioeconomic History  . Marital status: Divorced    Spouse name: Not on file  . Number of children: Not on file  . Years of education: Not on file  . Highest education level: Not on file  Occupational History  . Not on file  Tobacco Use  . Smoking status: Former Smoker    Packs/day: 0.25    Years: 45.00    Pack years: 11.25    Types: Cigarettes  . Smokeless tobacco: Never Used  . Tobacco comment: quit  Vaping Use  .  Vaping Use: Never used  Substance and Sexual Activity  . Alcohol use: No  . Drug use: Never  . Sexual activity: Not Currently  Other Topics Concern  . Not on file  Social History Narrative  . Not on file   Social Determinants of Health   Financial Resource Strain:   . Difficulty of Paying Living Expenses: Not on file  Food Insecurity:   . Worried About Programme researcher, broadcasting/film/video in the Last Year: Not on file  . Ran Out of Food in the Last Year: Not on file  Transportation Needs:   . Lack of Transportation (Medical): Not on file  . Lack of Transportation (Non-Medical): Not on file  Physical Activity:   . Days of Exercise per Week: Not on file  . Minutes of Exercise per Session: Not on file  Stress:   . Feeling of Stress : Not on file  Social Connections:   . Frequency of Communication with Friends and Family: Not on file  . Frequency of Social Gatherings with Friends and Family: Not on file  . Attends  Religious Services: Not on file  . Active Member of Clubs or Organizations: Not on file  . Attends Banker Meetings: Not on file  . Marital Status: Not on file  Intimate Partner Violence:   . Fear of Current or Ex-Partner: Not on file  . Emotionally Abused: Not on file  . Physically Abused: Not on file  . Sexually Abused: Not on file    Review of Systems: See HPI, otherwise negative ROS  Physical Exam: BP (!) 190/87   Pulse 100   Temp (!) 96.6 F (35.9 C) (Temporal)   Resp 18   Ht 5\' 7"  (1.702 m)   Wt 64.4 kg   SpO2 100%   BMI 22.24 kg/m  General:   Alert,  pleasant and cooperative in NAD Head:  Normocephalic and atraumatic. Neck:  Supple; no masses or thyromegaly. Lungs:  Clear throughout to auscultation, normal respiratory effort.    Heart:  +S1, +S2, Regular rate and rhythm, No edema. Abdomen:  Soft, nontender and nondistended. Normal bowel sounds, without guarding, and without rebound.   Neurologic:  Alert and  oriented x4;  grossly normal neurologically.  Impression/Plan: Sylvia [name] is here for a colonoscopy to be performed for history of polyps.  Pt had poor prep on previous exam.  She received a 2-day prep for this exam and started appropriately.  However, she ate tuna fish sandwich yesterday at 3 PM.  We extensively discussed with her that she may still have stool in her colon and the prep may be suboptimal, therefore the procedure may be incomplete.  We gave her the options of rescheduling the procedure.  However, she is insistent on proceeding today and understands the risk of an incomplete procedure, versus having to repeat procedure within the next few months.  Her urine drug screen is pending at this time  Risks, benefits, limitations, and alternatives regarding  colonoscopy have been reviewed with the patient.  Questions have been answered.  All parties agreeable.   Pasty Spillers, MD  01/16/2020, 8:57 AM

## 2020-01-16 NOTE — Transfer of Care (Signed)
Immediate Anesthesia Transfer of Care Note  Patient: Sylvia [name]  Procedure(s) Performed: COLONOSCOPY WITH PROPOFOL (N/A )  Patient Location: PACU and Endoscopy Unit  Anesthesia Type:MAC  Level of Consciousness: awake, alert , oriented and patient cooperative  Airway & Oxygen Therapy: Patient Spontanous Breathing  Post-op Assessment: Report given to RN and Post -op Vital signs reviewed and stable  Post vital signs: Reviewed and stable  Last Vitals:  Vitals Value Taken Time  BP 145/75 01/16/20 1220  Temp    Pulse 77 01/16/20 1223  Resp 16 01/16/20 1223  SpO2 100 % 01/16/20 1223  Vitals shown include unvalidated device data.  Last Pain:  Vitals:   01/16/20 1220  TempSrc:   PainSc: 0-No pain         Complications: No complications documented.

## 2020-01-16 NOTE — H&P (Signed)
  Patient had multiple attempts for IV line placement today.  One IV line was eventually placed, but did not work well.  IV line was reattempted by anesthesia staff, including by anesthesiologist Dr. Darcey Nora and IV line access could not be obtained even with ultrasound assistance.  Dr. Darcey Nora does recommend aborting the case at this time with PICC line placement for the future.  We will need to discuss this with the patient and timing in future procedures to be discussed with her in detail at a later time.  The procedure was not started, the colonoscope was not in inserted as patient could not be sedated

## 2020-01-16 NOTE — Anesthesia Postprocedure Evaluation (Signed)
Anesthesia Post Note  Patient: Sylvia [name]  Procedure(s) Performed: COLONOSCOPY WITH PROPOFOL (N/A )  Patient location during evaluation: Endoscopy Anesthesia Type: General Level of consciousness: awake and alert and oriented Pain management: pain level controlled Vital Signs Assessment: post-procedure vital signs reviewed and stable Respiratory status: spontaneous breathing Cardiovascular status: blood pressure returned to baseline Anesthetic complications: no   No complications documented.   Last Vitals:  Vitals:   01/16/20 0835 01/16/20 1220  BP: (!) 190/87   Pulse: 100   Resp: 18   Temp: (!) 35.9 C (!) 35.7 C  SpO2: 100%     Last Pain:  Vitals:   01/16/20 1230  TempSrc:   PainSc: 0-No pain                 Livier Hendel

## 2020-01-16 NOTE — Anesthesia Preprocedure Evaluation (Signed)
Anesthesia Evaluation  Patient identified by MRN, date of birth, ID band Patient awake    Reviewed: Allergy & Precautions, H&P , NPO status , Patient's Chart, lab work & pertinent test results, reviewed documented beta blocker date and time   Airway Mallampati: II   Neck ROM: full    Dental  (+) Teeth Intact   Pulmonary neg pulmonary ROS, COPD, Patient did not abstain from smoking., former smoker,    Pulmonary exam normal breath sounds clear to auscultation       Cardiovascular Exercise Tolerance: Poor hypertension, On Medications + Peripheral Vascular Disease  negative cardio ROS Normal cardiovascular exam Rhythm:regular Rate:Normal     Neuro/Psych PSYCHIATRIC DISORDERS Anxiety Depression negative neurological ROS  negative psych ROS   GI/Hepatic negative GI ROS, Neg liver ROS, PUD, GERD  Medicated,  Endo/Other  negative endocrine ROSdiabetes, Well Controlled, Type 2, Oral Hypoglycemic Agents  Renal/GU negative Renal ROS  negative genitourinary   Musculoskeletal   Abdominal   Peds  Hematology  (+) Blood dyscrasia, anemia ,   Anesthesia Other Findings Past Medical History: 05/16/2019: Anemia of chronic disease No date: Anxiety     Comment:  h/o No date: COPD (chronic obstructive pulmonary disease) (HCC) No date: Diabetes mellitus without complication (HCC) No date: GERD (gastroesophageal reflux disease) No date: Hypertension     Comment:  bp under control-off meds since 2019 Past Surgical History: No date: ABDOMINAL HYSTERECTOMY 12/27/2018: AMPUTATION; Right     Comment:  Procedure: AMPUTATION BELOW KNEE;  Surgeon: Annice Needy, MD;  Location: [facility] ORS;  Service: General;                Laterality: Right; No date: CHOLECYSTECTOMY 04/16/2019: COLONOSCOPY WITH PROPOFOL; N/A     Comment:  Procedure: COLONOSCOPY WITH PROPOFOL;  Surgeon:               Pasty Spillers, MD;  Location: [facility]  ENDOSCOPY;                Service: Endoscopy;  Laterality: N/A; 06/15/2018: ESOPHAGOGASTRODUODENOSCOPY (EGD) WITH PROPOFOL; N/A     Comment:  Procedure: ESOPHAGOGASTRODUODENOSCOPY (EGD) WITH               PROPOFOL;  Surgeon: Toledo, Boykin Nearing, MD;  Location:               [facility] ENDOSCOPY;  Service: Gastroenterology;  Laterality:               N/A; 01/03/2019: ESOPHAGOGASTRODUODENOSCOPY (EGD) WITH PROPOFOL; N/A     Comment:  Procedure: ESOPHAGOGASTRODUODENOSCOPY (EGD) WITH               PROPOFOL;  Surgeon: Midge Minium, MD;  Location: [facility]               ENDOSCOPY;  Service: Endoscopy;  Laterality: N/A; 04/16/2019: GIVENS CAPSULE STUDY     Comment:  Procedure: GIVENS CAPSULE STUDY;  Surgeon: Pasty Spillers, MD;  Location: [facility] ENDOSCOPY;  Service:               Endoscopy;; 06/25/2019: GIVENS CAPSULE STUDY; N/A     Comment:  Procedure: GIVENS CAPSULE STUDY;  Surgeon: Wyline Mood,               MD;  Location: [facility] ENDOSCOPY;  Service:  Gastroenterology;  Laterality: N/A; 08/21/2018: LOWER EXTREMITY ANGIOGRAPHY; Right     Comment:  Procedure: LOWER EXTREMITY ANGIOGRAPHY;  Surgeon: Annice Needy, MD;  Location: [facility];  Service:               Cardiovascular;  Laterality: Right; 08/28/2018: LOWER EXTREMITY ANGIOGRAPHY; Left     Comment:  Procedure: LOWER EXTREMITY ANGIOGRAPHY;  Surgeon: Annice Needy, MD;  Location: [facility];  Service:               Cardiovascular;  Laterality: Left; 08/28/2018: LOWER EXTREMITY ANGIOGRAPHY; Right     Comment:  Procedure: Lower Extremity Angiography;  Surgeon: Annice Needy, MD;  Location: [facility];  Service:               Cardiovascular;  Laterality: Right; 12/18/2018: LOWER EXTREMITY ANGIOGRAPHY; Right     Comment:  Procedure: Lower Extremity Angiography;  Surgeon: Annice Needy, MD;  Location: [facility];  Service:                Cardiovascular;  Laterality: Right; 12/18/2018: LOWER EXTREMITY ANGIOGRAPHY; Right     Comment:  Procedure: Lower Extremity Angiography;  Surgeon: Annice Needy, MD;  Location: [facility];  Service:               Cardiovascular;  Laterality: Right; 12/21/2018: LOWER EXTREMITY ANGIOGRAPHY; Left     Comment:  Procedure: Lower Extremity Angiography;  Surgeon: Annice Needy, MD;  Location: [facility];  Service:               Cardiovascular;  Laterality: Left; 12/21/2018: LOWER EXTREMITY ANGIOGRAPHY; Right     Comment:  Procedure: Lower Extremity Angiography;  Surgeon: Annice Needy, MD;  Location: [facility];  Service:               Cardiovascular;  Laterality: Right; 12/22/2018: LOWER EXTREMITY INTERVENTION; N/A     Comment:  Procedure: LOWER EXTREMITY INTERVENTION;  Surgeon: Annice Needy, MD;  Location: [facility];  Service:               Cardiovascular;  Laterality: N/A; BMI    Body Mass Index: 22.24 kg/m     Reproductive/Obstetrics negative OB ROS                             Anesthesia Physical Anesthesia Plan  ASA: III  Anesthesia Plan: General   Post-op Pain Management:    Induction:   PONV Risk Score and Plan:   Airway Management Planned:   Additional Equipment:   Intra-op Plan:   Post-operative Plan:   Informed Consent: I have reviewed the patients History and Physical, chart, labs and discussed the procedure  including the risks, benefits and alternatives for the proposed anesthesia with the patient or authorized representative who has indicated his/her understanding and acceptance.     Dental Advisory Given  Plan Discussed with: CRNA  Anesthesia Plan Comments:         Anesthesia Quick Evaluation

## 2020-01-17 ENCOUNTER — Encounter: Payer: Self-pay | Admitting: Gastroenterology

## 2020-02-04 ENCOUNTER — Other Ambulatory Visit: Payer: Self-pay | Admitting: Physician Assistant

## 2020-02-04 DIAGNOSIS — Z1231 Encounter for screening mammogram for malignant neoplasm of breast: Secondary | ICD-10-CM

## 2020-02-04 DIAGNOSIS — Z13820 Encounter for screening for osteoporosis: Secondary | ICD-10-CM

## 2020-02-22 IMAGING — US US EXTREM LOW ARTERIAL SEG MULTIPLE*R*
1 series · 14 of 25 positions shown · non-contrast
Comparison: None.

CLINICAL DATA: Right leg pain

EXAM:
RIGHT LOWER EXTREMITY ARTERIAL DUPLEX SCAN
TECHNIQUE: Gray-scale sonography as well as color Doppler and duplex ultrasound
was performed to evaluate the lower extremity arteries including the
common, superficial and profunda femoral arteries, popliteal artery
and calf arteries.

[Series 1: us extrem low arterial seg multiple*right* · 14 of 36 slices shown]
[im 1/36]
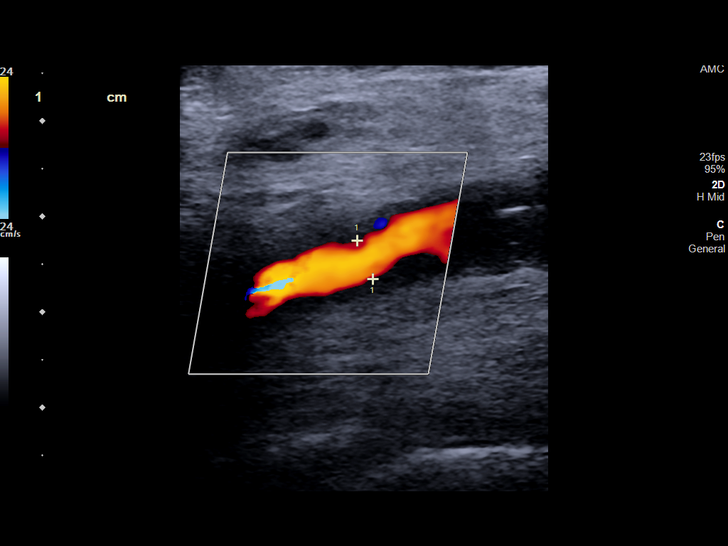
[im 3/36]
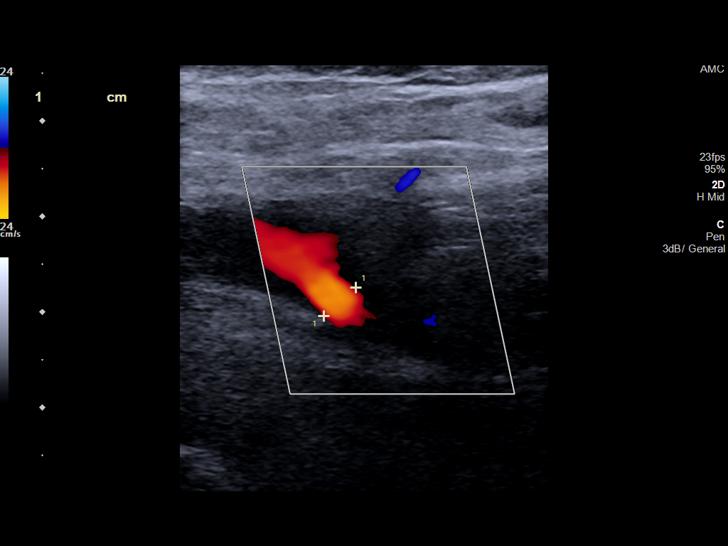
[im 6/36]
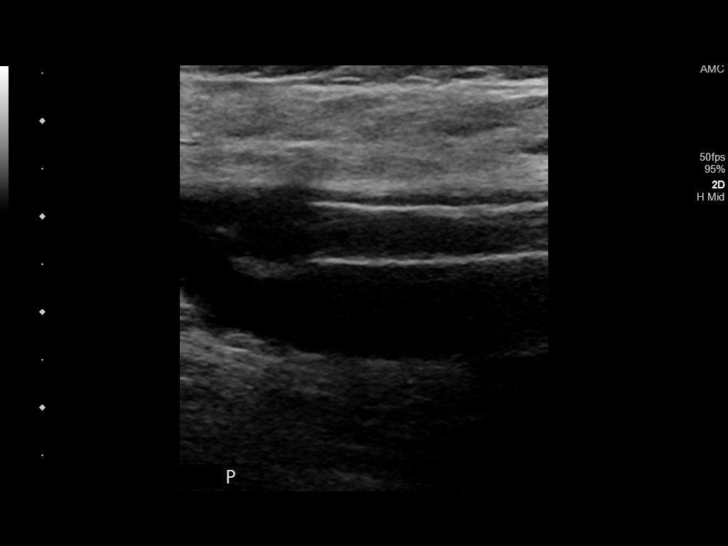
[im 9/36]
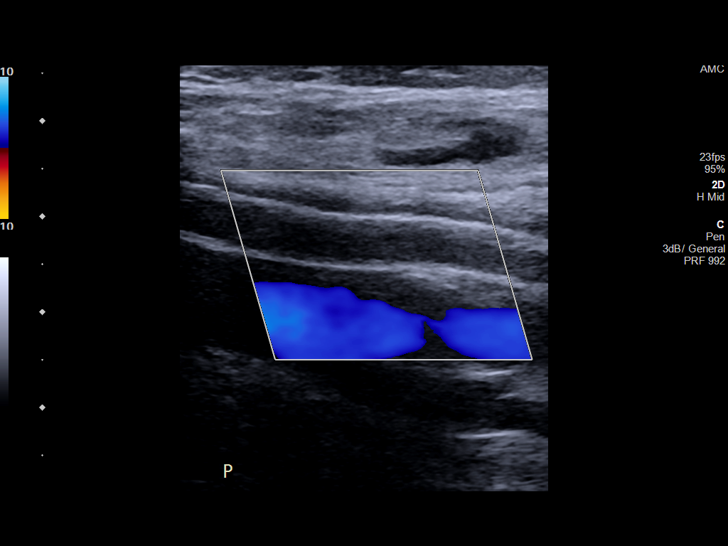
[im 12/36]
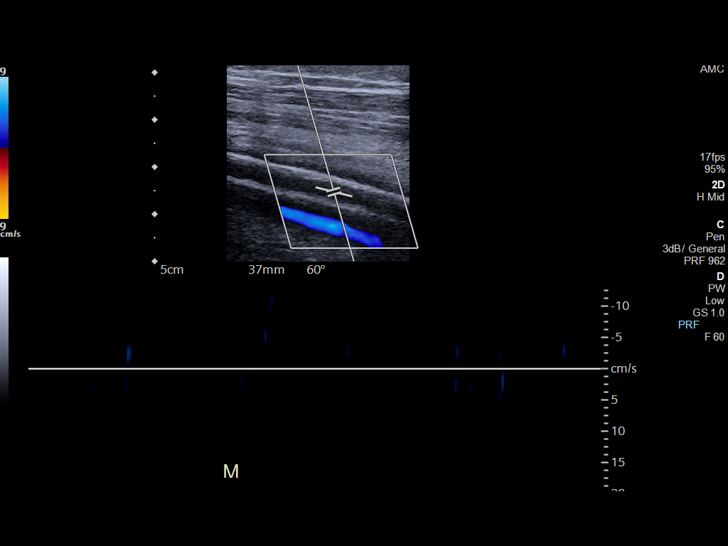
[im 14/36]
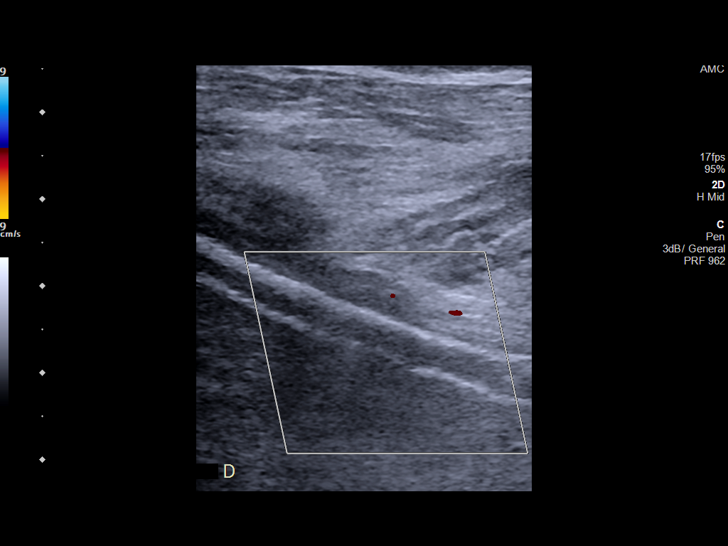
[im 17/36]
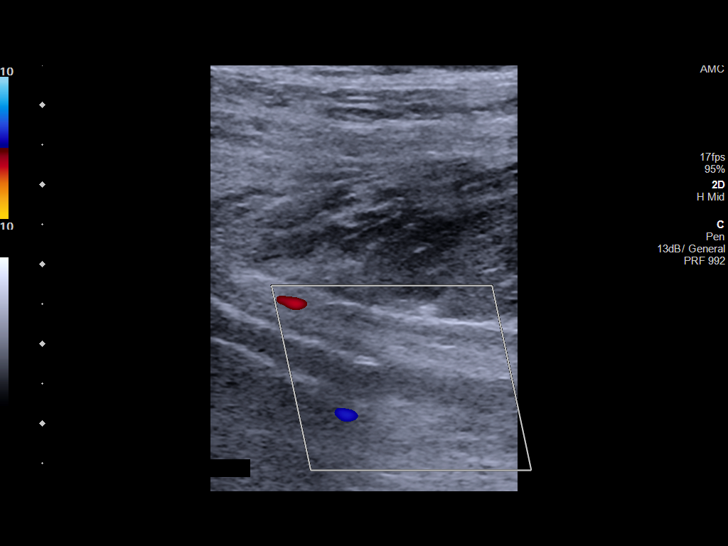
[im 19/36]
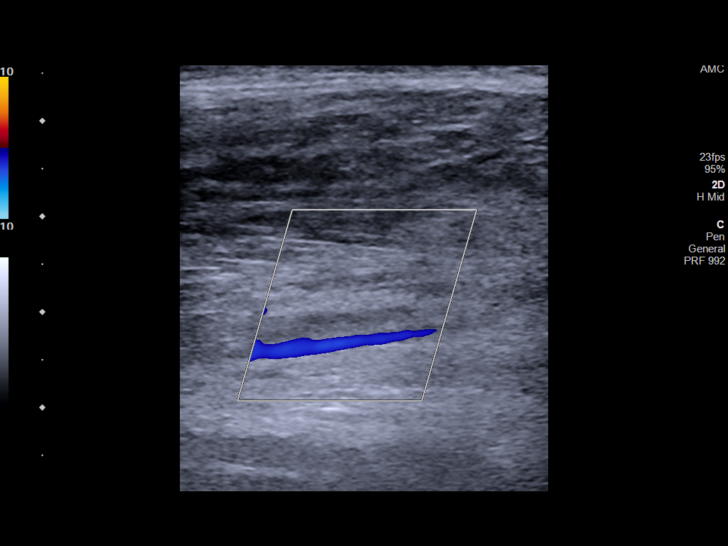
[im 22/36]
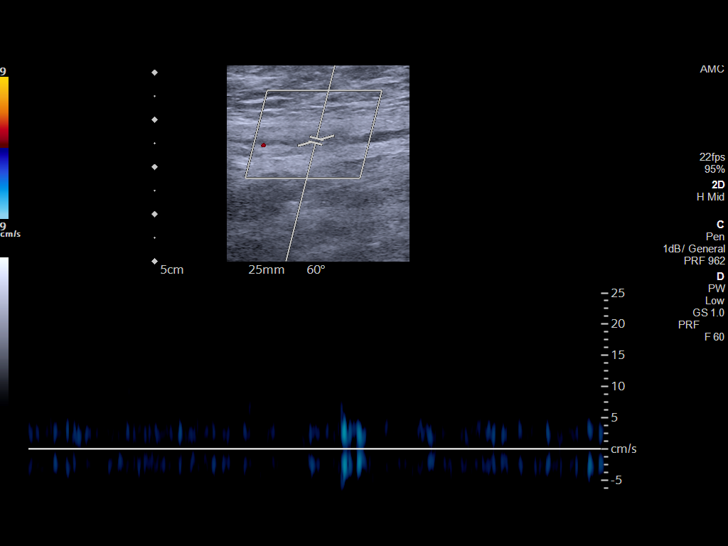
[im 24/36]
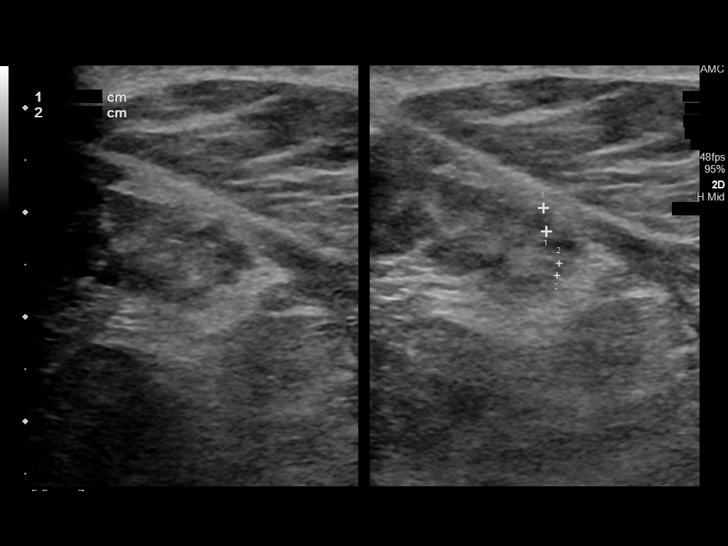
[im 27/36]
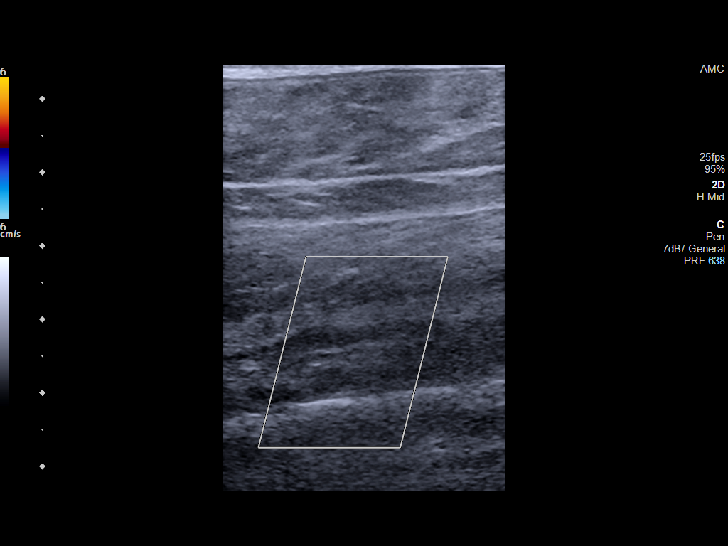
[im 30/36]
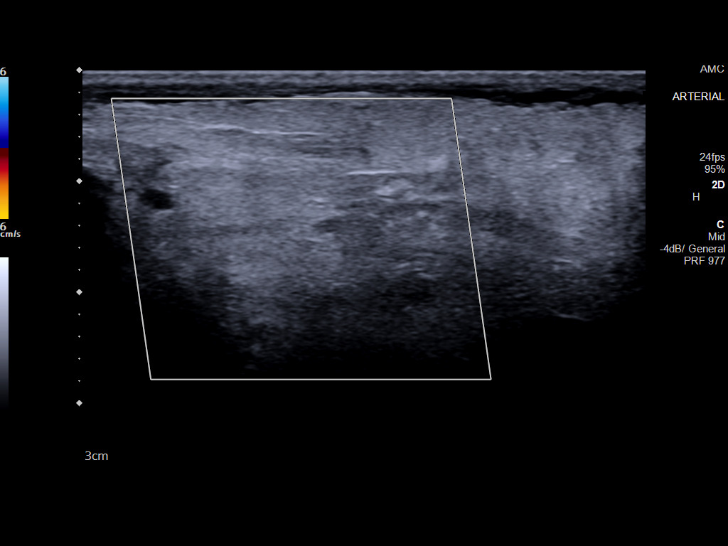
[im 33/36]
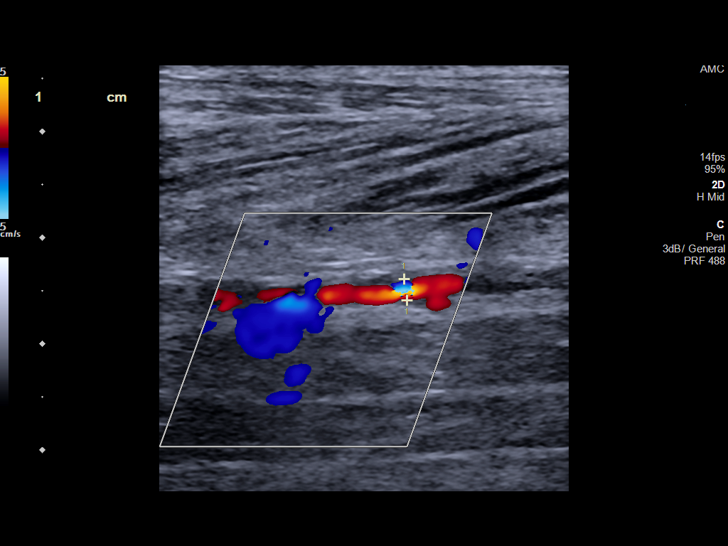
[im 36/36]
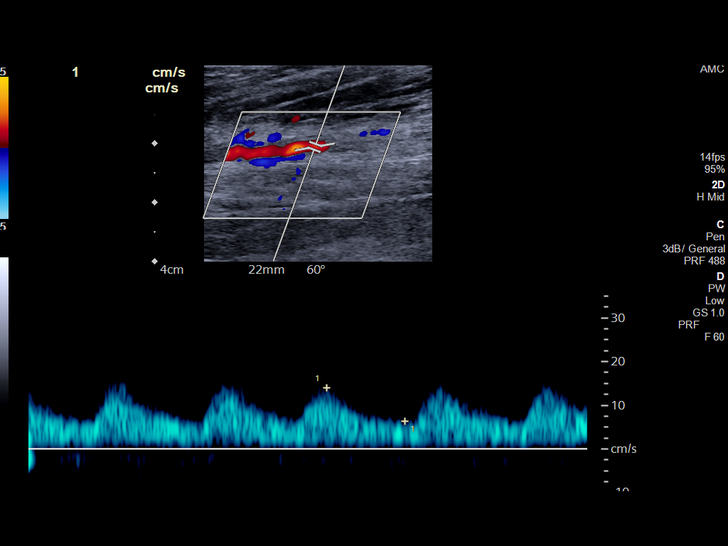

[14 of 25 positions shown; findings below may reference images not displayed]

FINDINGS: Right lower Extremity

ABI: Not calculated

Inflow: Mild plaque through the common femoral artery. Multiphasic
waveform.

Outflow: Stent from the proximal SFA which is occluded. Occlusion
extends through the popliteal artery.

Runoff: No flow signal in posterior tibial or peroneal arteries.
Reconstituted monophasic slow flow in the anterior tibial artery.
IMPRESSION: 1. Long segment right SFA and popliteal arterial occlusion with
reconstitution of anterior tibial flow.

## 2020-03-20 ENCOUNTER — Encounter: Payer: Self-pay | Admitting: Ophthalmology

## 2020-03-20 ENCOUNTER — Other Ambulatory Visit: Payer: Self-pay

## 2020-03-25 ENCOUNTER — Other Ambulatory Visit: Payer: Self-pay

## 2020-03-25 ENCOUNTER — Other Ambulatory Visit
Admission: RE | Admit: 2020-03-25 | Discharge: 2020-03-25 | Disposition: A | Discharge status: Home / Self Care | Payer: Medicare HMO | Source: Ambulatory Visit | Attending: Ophthalmology | Admitting: Ophthalmology

## 2020-03-25 DIAGNOSIS — Z20822 Contact with and (suspected) exposure to covid-19: Secondary | ICD-10-CM | POA: Insufficient documentation

## 2020-03-25 DIAGNOSIS — Z01812 Encounter for preprocedural laboratory examination: Secondary | ICD-10-CM | POA: Insufficient documentation

## 2020-03-25 NOTE — Discharge Instructions (Signed)

## 2020-03-26 LAB — SARS CORONAVIRUS 2 (TAT 6-24 HRS): SARS Coronavirus 2: NEGATIVE

## 2020-03-27 ENCOUNTER — Encounter: Payer: Self-pay | Admitting: Ophthalmology

## 2020-03-27 ENCOUNTER — Ambulatory Visit: Payer: Medicare HMO | Admitting: Anesthesiology

## 2020-03-27 ENCOUNTER — Other Ambulatory Visit: Payer: Self-pay

## 2020-03-27 ENCOUNTER — Ambulatory Visit
Admission: RE | Admit: 2020-03-27 | Discharge: 2020-03-27 | Disposition: A | Discharge status: Home / Self Care | Payer: Medicare HMO | Attending: Ophthalmology | Admitting: Ophthalmology

## 2020-03-27 ENCOUNTER — Encounter
Admission: RE | Disposition: A | Discharge status: Home / Self Care | Payer: Self-pay | Source: Home / Self Care | Attending: Ophthalmology

## 2020-03-27 DIAGNOSIS — E1136 Type 2 diabetes mellitus with diabetic cataract: Secondary | ICD-10-CM | POA: Diagnosis present

## 2020-03-27 DIAGNOSIS — H2511 Age-related nuclear cataract, right eye: Secondary | ICD-10-CM | POA: Diagnosis not present

## 2020-03-27 DIAGNOSIS — Z7901 Long term (current) use of anticoagulants: Secondary | ICD-10-CM | POA: Diagnosis not present

## 2020-03-27 DIAGNOSIS — Z79899 Other long term (current) drug therapy: Secondary | ICD-10-CM | POA: Insufficient documentation

## 2020-03-27 DIAGNOSIS — Z7984 Long term (current) use of oral hypoglycemic drugs: Secondary | ICD-10-CM | POA: Diagnosis not present

## 2020-03-27 HISTORY — PX: CATARACT EXTRACTION W/PHACO: SHX586

## 2020-03-27 LAB — GLUCOSE, CAPILLARY
Glucose-Capillary: 209 mg/dL — ABNORMAL HIGH (ref 70–99)
Glucose-Capillary: 212 mg/dL — ABNORMAL HIGH (ref 70–99)

## 2020-03-27 SURGERY — PHACOEMULSIFICATION, CATARACT, WITH IOL INSERTION
Anesthesia: Monitor Anesthesia Care | Site: Eye | Laterality: Right

## 2020-03-27 MED ORDER — LACTATED RINGERS IV SOLN: INTRAVENOUS | Status: DC

## 2020-03-27 MED ORDER — MOXIFLOXACIN HCL 0.5 % OP SOLN
OPHTHALMIC | Status: DC | PRN
  Administered 2020-03-27: 0.2 mL via OPHTHALMIC

## 2020-03-27 MED ORDER — FENTANYL CITRATE (PF) 100 MCG/2ML IJ SOLN
INTRAMUSCULAR | Status: DC | PRN
  Administered 2020-03-27: 50 ug via INTRAVENOUS

## 2020-03-27 MED ORDER — ACETAMINOPHEN 325 MG PO TABS: 325.0000 mg | ORAL_TABLET | Freq: Once | ORAL | Status: DC

## 2020-03-27 MED ORDER — ARMC OPHTHALMIC DILATING DROPS
1.0000 "application " | OPHTHALMIC | Status: DC | PRN
  Administered 2020-03-27 (×3): 1 via OPHTHALMIC

## 2020-03-27 MED ORDER — LIDOCAINE HCL (PF) 2 % IJ SOLN
INTRAOCULAR | Status: DC | PRN
  Administered 2020-03-27: 2 mL

## 2020-03-27 MED ORDER — NA CHONDROIT SULF-NA HYALURON 40-17 MG/ML IO SOLN
INTRAOCULAR | Status: DC | PRN
  Administered 2020-03-27: 1 mL via INTRAOCULAR

## 2020-03-27 MED ORDER — TETRACAINE HCL 0.5 % OP SOLN
1.0000 [drp] | OPHTHALMIC | Status: DC | PRN
  Administered 2020-03-27 (×3): 1 [drp] via OPHTHALMIC

## 2020-03-27 MED ORDER — MIDAZOLAM HCL 2 MG/2ML IJ SOLN
INTRAMUSCULAR | Status: DC | PRN
  Administered 2020-03-27: 1 mg via INTRAVENOUS

## 2020-03-27 MED ORDER — BRIMONIDINE TARTRATE-TIMOLOL 0.2-0.5 % OP SOLN
OPHTHALMIC | Status: DC | PRN
  Administered 2020-03-27: 1 [drp] via OPHTHALMIC

## 2020-03-27 MED ORDER — ACETAMINOPHEN 160 MG/5ML PO SOLN: 325.0000 mg | Freq: Once | ORAL | Status: DC

## 2020-03-27 MED ORDER — EPINEPHRINE PF 1 MG/ML IJ SOLN
INTRAOCULAR | Status: DC | PRN
  Administered 2020-03-27: 71 mL via OPHTHALMIC

## 2020-03-27 SURGICAL SUPPLY — 17 items
CANNULA ANT/CHMB 27GA (MISCELLANEOUS) ×4 IMPLANT
GLOVE SURG LX 8.0 MICRO (GLOVE) ×1
GLOVE SURG LX STRL 8.0 MICRO (GLOVE) ×1 IMPLANT
GLOVE SURG TRIUMPH 8.0 PF LTX (GLOVE) ×2 IMPLANT
GOWN STRL REUS W/ TWL LRG LVL3 (GOWN DISPOSABLE) ×2 IMPLANT
GOWN STRL REUS W/TWL LRG LVL3 (GOWN DISPOSABLE) ×4
LENS IOL TECNIS EYHANCE 20.0 (Intraocular Lens) ×2 IMPLANT
MARKER SKIN DUAL TIP RULER LAB (MISCELLANEOUS) ×2 IMPLANT
NEEDLE FILTER BLUNT 18X 1/2SAF (NEEDLE) ×1
NEEDLE FILTER BLUNT 18X1 1/2 (NEEDLE) ×1 IMPLANT
PACK EYE AFTER SURG (MISCELLANEOUS) ×2 IMPLANT
PACK OPTHALMIC (MISCELLANEOUS) ×2 IMPLANT
PACK PORFILIO (MISCELLANEOUS) ×2 IMPLANT
SYR 3ML LL SCALE MARK (SYRINGE) ×2 IMPLANT
SYR TB 1ML LUER SLIP (SYRINGE) ×2 IMPLANT
WATER STERILE IRR 250ML POUR (IV SOLUTION) ×2 IMPLANT
WIPE NON LINTING 3.25X3.25 (MISCELLANEOUS) ×2 IMPLANT

## 2020-03-27 NOTE — Anesthesia Postprocedure Evaluation (Signed)
Anesthesia Post Note  Patient: Sylvia [name]  Procedure(s) Performed: CATARACT EXTRACTION PHACO AND INTRAOCULAR LENS PLACEMENT (IOC) RIGHT DIABETIC 7.54 00:52.5 (Right Eye)     Patient location during evaluation: PACU Anesthesia Type: MAC Level of consciousness: awake and alert and oriented Pain management: satisfactory to patient Vital Signs Assessment: post-procedure vital signs reviewed and stable Respiratory status: spontaneous breathing, nonlabored ventilation and respiratory function stable Cardiovascular status: blood pressure returned to baseline and stable Postop Assessment: Adequate PO intake and No signs of nausea or vomiting Anesthetic complications: no   No complications documented.  Raliegh Ip

## 2020-03-27 NOTE — Op Note (Signed)
PREOPERATIVE DIAGNOSIS:  Nuclear sclerotic cataract of the right eye.   POSTOPERATIVE DIAGNOSIS:  Cataract   OPERATIVE PROCEDURE:@   SURGEON:  Galen Manila, MD.   ANESTHESIA:  Anesthesiologist: Ranee Gosselin, MD CRNA: Maree Krabbe, CRNA  1.      Managed anesthesia care. 2.      0.62ml of Shugarcaine was instilled in the eye following the paracentesis.   COMPLICATIONS:  None.   TECHNIQUE:   Stop and chop   DESCRIPTION OF PROCEDURE:  The patient was examined and consented in the preoperative holding area where the aforementioned topical anesthesia was applied to the right eye and then brought back to the Operating Room where the right eye was prepped and draped in the usual sterile ophthalmic fashion and a lid speculum was placed. A paracentesis was created with the side port blade and the anterior chamber was filled with viscoelastic. A near clear corneal incision was performed with the steel keratome. A continuous curvilinear capsulorrhexis was performed with a cystotome followed by the capsulorrhexis forceps. Hydrodissection and hydrodelineation were carried out with BSS on a blunt cannula. The lens was removed in a stop and chop  technique and the remaining cortical material was removed with the irrigation-aspiration handpiece. The capsular bag was inflated with viscoelastic and the Technis [id number]  lens was placed in the capsular bag without complication. The remaining viscoelastic was removed from the eye with the irrigation-aspiration handpiece. The wounds were hydrated. The anterior chamber was flushed with BSS and the eye was inflated to physiologic pressure. 0.26ml of Vigamox was placed in the anterior chamber. The wounds were found to be water tight. The eye was dressed with Combigan. The patient was given protective glasses to wear throughout the day and a shield with which to sleep tonight. The patient was also given drops with which to begin a drop regimen today and will follow-up  with me in one day. Implant Name Type Inv. Item Serial No. Manufacturer Lot No. LRB No. Used Action  LENS IOL TECNIS EYHANCE 20.0 - [id number] Intraocular Lens LENS IOL TECNIS EYHANCE 20.0 [id number] JOHNSON   Right 1 Implanted   Procedure(s): CATARACT EXTRACTION PHACO AND INTRAOCULAR LENS PLACEMENT (IOC) RIGHT DIABETIC 7.54 00:52.5 (Right)  Electronically signed: Galen Manila 03/27/2020 8:35 AM

## 2020-03-27 NOTE — Transfer of Care (Signed)
Immediate Anesthesia Transfer of Care Note  Patient: Sylvia [name]  Procedure(s) Performed: CATARACT EXTRACTION PHACO AND INTRAOCULAR LENS PLACEMENT (IOC) RIGHT DIABETIC 7.54 00:52.5 (Right Eye)  Patient Location: PACU  Anesthesia Type: MAC  Level of Consciousness: awake, alert  and patient cooperative  Airway and Oxygen Therapy: Patient Spontanous Breathing and Patient connected to supplemental oxygen  Post-op Assessment: Post-op Vital signs reviewed, Patient's Cardiovascular Status Stable, Respiratory Function Stable, Patent Airway and No signs of Nausea or vomiting  Post-op Vital Signs: Reviewed and stable  Complications: No complications documented.

## 2020-03-27 NOTE — Anesthesia Preprocedure Evaluation (Signed)
Anesthesia Evaluation  Patient identified by MRN, date of birth, ID band Patient awake    Reviewed: Allergy & Precautions, H&P , NPO status , Patient's Chart, lab work & pertinent test results  Airway Mallampati: II  TM Distance: >3 FB Neck ROM: full    Dental  (+) Edentulous Upper, Edentulous Lower   Pulmonary COPD, former smoker,    Pulmonary exam normal breath sounds clear to auscultation       Cardiovascular hypertension, negative cardio ROS Normal cardiovascular exam Rhythm:regular Rate:Normal     Neuro/Psych negative neurological ROS  negative psych ROS   GI/Hepatic Neg liver ROS, GERD  ,  Endo/Other  negative endocrine ROSdiabetes  Renal/GU negative Renal ROS  negative genitourinary   Musculoskeletal negative musculoskeletal ROS (+)   Abdominal   Peds negative pediatric ROS (+)  Hematology negative hematology ROS (+)   Anesthesia Other Findings   Reproductive/Obstetrics negative OB ROS                             Anesthesia Physical Anesthesia Plan  ASA: III  Anesthesia Plan: MAC   Post-op Pain Management:    Induction:   PONV Risk Score and Plan: 2 and Treatment may vary due to age or medical condition, TIVA and Midazolam  Airway Management Planned:   Additional Equipment:   Intra-op Plan:   Post-operative Plan:   Informed Consent: I have reviewed the patients History and Physical, chart, labs and discussed the procedure including the risks, benefits and alternatives for the proposed anesthesia with the patient or authorized representative who has indicated his/her understanding and acceptance.     Dental Advisory Given  Plan Discussed with: CRNA  Anesthesia Plan Comments:         Anesthesia Quick Evaluation

## 2020-03-27 NOTE — H&P (Signed)
[facility]   Primary Care Physician:  [facility] Ophthalmologist: Dr. Druscilla Brownie  Pre-Procedure History & Physical: HPI:  Sylvia [name] is a 64 y.o. female here for cataract surgery.   Past Medical History:  Diagnosis Date  . Anemia of chronic disease 05/16/2019  . Anxiety    h/o  . COPD (chronic obstructive pulmonary disease) (HCC)   . Diabetes mellitus without complication (HCC)   . GERD (gastroesophageal reflux disease)   . Hypertension    bp under control-off meds since 2019    Past Surgical History:  Procedure Laterality Date  . ABDOMINAL HYSTERECTOMY    . AMPUTATION Right 12/27/2018   Procedure: AMPUTATION BELOW KNEE;  Surgeon: Annice Needy, MD;  Location: [facility] ORS;  Service: General;  Laterality: Right;  . CHOLECYSTECTOMY    . COLONOSCOPY WITH PROPOFOL N/A 04/16/2019   Procedure: COLONOSCOPY WITH PROPOFOL;  Surgeon: Pasty Spillers, MD;  Location: [facility] END[facility];  Service: Endoscopy;  Laterality: N/A;  . COLONOSCOPY WITH PROPOFOL N/A 01/16/2020   Procedure: COLONOSCOPY WITH PROPOFOL;  Surgeon: Pasty Spillers, MD;  Location: [facility] END[facility];  Service: Endoscopy;  Laterality: N/A;  . ESOPHAGOGASTRODUODENOSCOPY (EGD) WITH PROPOFOL N/A 06/15/2018   Procedure: ESOPHAGOGASTRODUODENOSCOPY (EGD) WITH PROPOFOL;  Surgeon: Toledo, Boykin Nearing, MD;  Location: [facility] END[facility];  Service: Gastroenterology;  Laterality: N/A;  . ESOPHAGOGASTRODUODENOSCOPY (EGD) WITH PROPOFOL N/A 01/03/2019   Procedure: ESOPHAGOGASTRODUODENOSCOPY (EGD) WITH PROPOFOL;  Surgeon: Midge Minium, MD;  Location: [facility];  Service: Endoscopy;  Laterality: N/A;  . GIVENS CAPSULE STUDY  04/16/2019   Procedure: GIVENS CAPSULE STUDY;  Surgeon: Pasty Spillers, MD;  Location: [facility] END[facility];  Service: Endoscopy;;  . GIVENS CAPSULE STUDY N/A 06/25/2019   Procedure: GIVENS CAPSULE STUDY;  Surgeon: Wyline Mood, MD;  Location: [facility] ENDOSCOPY;  Service: Gastroenterology;   Laterality: N/A;  . LOWER EXTREMITY ANGIOGRAPHY Right 08/21/2018   Procedure: LOWER EXTREMITY ANGIOGRAPHY;  Surgeon: Annice Needy, MD;  Location: [facility];  Service: Cardiovascular;  Laterality: Right;  . LOWER EXTREMITY ANGIOGRAPHY Left 08/28/2018   Procedure: LOWER EXTREMITY ANGIOGRAPHY;  Surgeon: Annice Needy, MD;  Location: [facility];  Service: Cardiovascular;  Laterality: Left;  . LOWER EXTREMITY ANGIOGRAPHY Right 08/28/2018   Procedure: Lower Extremity Angiography;  Surgeon: Annice Needy, MD;  Location: [facility];  Service: Cardiovascular;  Laterality: Right;  . LOWER EXTREMITY ANGIOGRAPHY Right 12/18/2018   Procedure: Lower Extremity Angiography;  Surgeon: Annice Needy, MD;  Location: [facility];  Service: Cardiovascular;  Laterality: Right;  . LOWER EXTREMITY ANGIOGRAPHY Right 12/18/2018   Procedure: Lower Extremity Angiography;  Surgeon: Annice Needy, MD;  Location: [facility];  Service: Cardiovascular;  Laterality: Right;  . LOWER EXTREMITY ANGIOGRAPHY Left 12/21/2018   Procedure: Lower Extremity Angiography;  Surgeon: Annice Needy, MD;  Location: [facility];  Service: Cardiovascular;  Laterality: Left;  . LOWER EXTREMITY ANGIOGRAPHY Right 12/21/2018   Procedure: Lower Extremity Angiography;  Surgeon: Annice Needy, MD;  Location: [facility];  Service: Cardiovascular;  Laterality: Right;  . LOWER EXTREMITY INTERVENTION N/A 12/22/2018   Procedure: LOWER EXTREMITY INTERVENTION;  Surgeon: Annice Needy, MD;  Location: [facility];  Service: Cardiovascular;  Laterality: N/A;    Prior to Admission medications   Medication Sig Start Date End Date Taking? Authorizing Provider  acetaminophen (TYLENOL) 325 MG tablet Take 650 mg by mouth every 6 (six) hours as needed.  Yes [provider]  albuterol (PROVENTIL HFA;VENTOLIN HFA) 108 (90 Base) MCG/ACT inhaler Inhale 2 puffs into the lungs every 6 (six) hours as needed  for wheezing or shortness of breath. 08/20/17   Jeanmarie Plant, MD  apixaban (ELIQUIS) 2.5 MG TABS tablet Take 1 tablet (2.5 mg total) by mouth 2 (two) times daily. 01/30/19   Georgiana Spinner, NP  atorvastatin (LIPITOR) 10 MG tablet Take 1 tablet by mouth daily. 08/21/18   [provider]  cetirizine (ZYRTEC) 10 MG tablet Take 10 mg by mouth daily.    [provider]  clopidogrel (PLAVIX) 75 MG tablet Take 75 mg by mouth daily. Patient not taking: Reported on 03/20/2020    [provider]  docusate sodium (COLACE) 100 MG capsule Take 100 mg by mouth 2 (two) times daily.    [provider]  DULoxetine (CYMBALTA) 30 MG capsule Take 30 mg by mouth daily.    [provider]  famotidine (PEPCID) 20 MG tablet Take 1 tablet (20 mg total) by mouth 2 (two) times daily. 10/25/19 11/24/19  Pasty Spillers, MD  ferrous sulfate 325 (65 FE) MG tablet Take 325 mg by mouth daily.     [provider]  gabapentin (NEURONTIN) 300 MG capsule Take 2 Capsules (600 mg) at Bedtime and 1 capsule (300 mg) in the afternoon 06/14/19   Georgiana Spinner, NP  lisinopril (ZESTRIL) 10 MG tablet Take 1 tablet (10 mg total) by mouth daily. 07/26/19   Arnetha Courser, MD  Melatonin 1 MG TABS Take 1 tablet by mouth at bedtime.    [provider]  Multiple Vitamins-Minerals (WOMENS MULTIVITAMIN PO) Take 1 tablet by mouth daily.    [provider]  Na Sulfate-K Sulfate-Mg Sulf 17.5-3.13-1.6 GM/177ML SOLN At 5 PM the day before procedure take 1 bottle and 5 hours before procedure take 1 bottle. 01/09/20   Pasty Spillers, MD  polyethylene glycol-electrolytes (NULYTELY) 420 g solution Prepare according to package instructions. Starting at 5:00 PM: Drink one 8 oz glass of mixture every 15 minutes until you finish half of the jug. Five hours prior to procedure, drink 8 oz glass of mixture every 15 minutes until it is all gone. Make sure you do not drink anything 4 hours prior  to your procedure. 10/25/19   Pasty Spillers, MD  rosuvastatin (CRESTOR) 5 MG tablet Take 1 tablet (5 mg total) by mouth daily at 6 PM. 07/26/19   Arnetha Courser, MD  sitaGLIPtin (JANUVIA) 100 MG tablet Take 100 mg by mouth daily.    [provider]  vitamin B-12 (CYANOCOBALAMIN) 500 MCG tablet Take 1 tablet (500 mcg total) by mouth daily. 11/26/19   Rickard Patience, MD    Allergies as of 02/15/2020 - Review Complete 01/16/2020  Allergen Reaction Noted  . Metformin and related Diarrhea 06/04/2019  . Penicillins Hives 11/14/2015  . Tramadol Itching 12/14/2018    Family History  Problem Relation Age of Onset  . Breast cancer Mother 63    Social History   Socioeconomic History  . Marital status: Divorced    Spouse name: Not on file  . Number of children: Not on file  . Years of education: Not on file  . Highest education level: Not on file  Occupational History  . Not on file  Tobacco Use  . Smoking status: Former Smoker    Packs/day: 0.25    Years: 45.00    Pack years: 11.25    Types: Cigarettes  .  Smokeless tobacco: Never Used  . Tobacco comment: quit  Vaping Use  . Vaping Use: Never used  Substance and Sexual Activity  . Alcohol use: No  . Drug use: Not Currently    Comment: last used in April 2021 per patient  . Sexual activity: Not Currently  Other Topics Concern  . Not on file  Social History Narrative  . Not on file   Social Determinants of Health   Financial Resource Strain: Not on file  Food Insecurity: Not on file  Transportation Needs: Not on file  Physical Activity: Not on file  Stress: Not on file  Social Connections: Not on file  Intimate Partner Violence: Not on file    Review of Systems: See HPI, otherwise negative ROS  Physical Exam: BP (!) 157/84   Pulse 88   Temp (!) 97 F (36.1 C) (Temporal)   Ht 5\' 7"  (1.702 m)   Wt 65.8 kg   SpO2 99%   BMI 22.71 kg/m  General:   Alert,  pleasant and cooperative in NAD Head:  Normocephalic  and atraumatic. Respiratory:  Normal work of breathing. Heart:  Regular rate and rhythm.   Impression/Plan: Camy Leder is here for cataract surgery.  Risks, benefits, limitations, and alternatives regarding cataract surgery have been reviewed with the patient.  Questions have been answered.  All parties agreeable.   [name], MD  03/27/2020, 8:10 AM

## 2020-03-28 ENCOUNTER — Encounter: Payer: Self-pay | Admitting: Ophthalmology

## 2020-04-02 DIAGNOSIS — Z0289 Encounter for other administrative examinations: Secondary | ICD-10-CM

## 2020-04-24 ENCOUNTER — Other Ambulatory Visit: Payer: Self-pay

## 2020-04-24 ENCOUNTER — Emergency Department: Payer: Medicare Other

## 2020-04-24 ENCOUNTER — Emergency Department
Admission: EM | Admit: 2020-04-24 | Discharge: 2020-04-24 | Disposition: A | Discharge status: Home / Self Care | Payer: Medicare Other | Attending: Emergency Medicine | Admitting: Emergency Medicine

## 2020-04-24 DIAGNOSIS — Z79899 Other long term (current) drug therapy: Secondary | ICD-10-CM | POA: Diagnosis not present

## 2020-04-24 DIAGNOSIS — J449 Chronic obstructive pulmonary disease, unspecified: Secondary | ICD-10-CM | POA: Insufficient documentation

## 2020-04-24 DIAGNOSIS — U071 COVID-19: Secondary | ICD-10-CM | POA: Insufficient documentation

## 2020-04-24 DIAGNOSIS — E1110 Type 2 diabetes mellitus with ketoacidosis without coma: Secondary | ICD-10-CM | POA: Diagnosis not present

## 2020-04-24 DIAGNOSIS — F17210 Nicotine dependence, cigarettes, uncomplicated: Secondary | ICD-10-CM | POA: Diagnosis not present

## 2020-04-24 DIAGNOSIS — Z7901 Long term (current) use of anticoagulants: Secondary | ICD-10-CM | POA: Diagnosis not present

## 2020-04-24 DIAGNOSIS — Z7984 Long term (current) use of oral hypoglycemic drugs: Secondary | ICD-10-CM | POA: Insufficient documentation

## 2020-04-24 DIAGNOSIS — R1031 Right lower quadrant pain: Secondary | ICD-10-CM | POA: Diagnosis present

## 2020-04-24 DIAGNOSIS — I10 Essential (primary) hypertension: Secondary | ICD-10-CM | POA: Diagnosis not present

## 2020-04-24 LAB — COMPREHENSIVE METABOLIC PANEL
ALT: 13 U/L (ref 0–44)
AST: 18 U/L (ref 15–41)
Albumin: 3.9 g/dL (ref 3.5–5.0)
Alkaline Phosphatase: 78 U/L (ref 38–126)
Anion gap: 10 (ref 5–15)
BUN: 8 mg/dL (ref 8–23)
CO2: 22 mmol/L (ref 22–32)
Calcium: 9.4 mg/dL (ref 8.9–10.3)
Chloride: 110 mmol/L (ref 98–111)
Creatinine, Ser: 0.55 mg/dL (ref 0.44–1.00)
GFR, Estimated: >60 mL/min (ref >60)
Glucose, Bld: 138 mg/dL — ABNORMAL HIGH (ref 70–99)
Potassium: 3.6 mmol/L (ref 3.5–5.1)
Sodium: 142 mmol/L (ref 135–145)
Total Bilirubin: 0.8 mg/dL (ref 0.3–1.2)
Total Protein: 7.8 g/dL (ref 6.5–8.1)

## 2020-04-24 LAB — CBC
HCT: 34.8 % — ABNORMAL LOW (ref 36.0–46.0)
Hemoglobin: 11.8 g/dL — ABNORMAL LOW (ref 12.0–15.0)
MCH: 29.1 pg (ref 26.0–34.0)
MCHC: 33.9 g/dL (ref 30.0–36.0)
MCV: 85.7 fL (ref 80.0–100.0)
Platelets: 218 10*3/uL (ref 150–400)
RBC: 4.06 MIL/uL (ref 3.87–5.11)
RDW: 13.2 % (ref 11.5–15.5)
WBC: 4.3 10*3/uL (ref 4.0–10.5)
nRBC: 0 % (ref 0.0–0.2)

## 2020-04-24 LAB — LIPASE, BLOOD: Lipase: 33 U/L (ref 11–51)

## 2020-04-24 LAB — RESP PANEL BY RT-PCR (FLU A&B, COVID) ARPGX2
Influenza A by PCR: NEGATIVE
Influenza B by PCR: NEGATIVE
SARS Coronavirus 2 by RT PCR: POSITIVE — AB

## 2020-04-24 MED ORDER — ONDANSETRON 4 MG PO TBDP: 4.0000 mg | ORAL_TABLET | Freq: Three times a day (TID) | ORAL | 0 refills | Status: AC | PRN

## 2020-04-24 MED ORDER — IOHEXOL 300 MG/ML  SOLN
100.0000 mL | Freq: Once | INTRAMUSCULAR | Status: AC | PRN
  Administered 2020-04-24: 100 mL via INTRAVENOUS
  Filled 2020-04-24: qty 100

## 2020-04-24 MED ORDER — DICYCLOMINE HCL 10 MG PO CAPS: 10.0000 mg | ORAL_CAPSULE | Freq: Three times a day (TID) | ORAL | 0 refills | Status: DC

## 2020-04-24 NOTE — Discharge Instructions (Addendum)
Take Bentyl for abdominal spasms. Take Zofran for nausea.

## 2020-04-24 NOTE — ED Triage Notes (Signed)
PT to ED c/o bilateral flank pain and epigastric pain since Tuesday. Intermittent lightheadedness. Pt denies urinary sx and vomiting but endorses diarrhea.

## 2020-04-24 NOTE — ED Provider Notes (Signed)
[facility]EMERGENCY DEPARTMENT  ____________________________________________  Time seen: Approximately 3:15 PM  I have reviewed the triage vital signs and the nursing notes.   HISTORY  Chief Complaint Flank Pain and Abdominal Pain   Historian Patient     HPI Sylvia [name] is a 65 y.o. female with a history of hypertension, GERD, diabetes and COPD, presents to the emergency department with bilateral flank pain, lightheadedness, right and left lower abdominal pain and some epigastric discomfort.  Patient states that she has had diarrhea but no vomiting.  She states that her abdomen seems more distended to her than usual and she has to keep her pants unbutton.  She has been afebrile at home.  No chest pain, chest tightness or shortness of breath.  No rhinorrhea, nasal congestion or nonproductive cough.  She denies experiencing similar symptoms in the past.  No sick contacts in her home with similar symptoms.  States that she has had pyelonephritis in the past but denies a history of nephrolithiasis.   Past Medical History:  Diagnosis Date  . Anemia of chronic disease 05/16/2019  . Anxiety    h/o  . COPD (chronic obstructive pulmonary disease) (HCC)   . Diabetes mellitus without complication (HCC)   . GERD (gastroesophageal reflux disease)   . Hypertension    bp under control-off meds since 2019     Immunizations up to date:  Yes.     Past Medical History:  Diagnosis Date  . Anemia of chronic disease 05/16/2019  . Anxiety    h/o  . COPD (chronic obstructive pulmonary disease) (HCC)   . Diabetes mellitus without complication (HCC)   . GERD (gastroesophageal reflux disease)   . Hypertension    bp under control-off meds since 2019    Patient Active Problem List   Diagnosis Date Noted  . Cocaine abuse (HCC) 07/26/2019  . Diabetes mellitus without complication (HCC)   . GERD (gastroesophageal reflux disease)   . COPD (chronic obstructive pulmonary disease) (HCC)   .  GIB (gastrointestinal bleeding)   . HLD (hyperlipidemia)   . Depression   . Elevated troponin   . Syncope   . Elevated troponin I level   . Anemia of chronic disease 05/16/2019  . Dizziness   . Polyp of colon   . Acute upper GI bleeding 04/13/2019  . Anemia associated with acute blood loss 04/13/2019  . Chronic anticoagulation 04/13/2019  . 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 Symptomatic anemia 04/13/2019  . Upper GI bleed 04/13/2019  . Acute GI bleeding 04/13/2019  . 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 Melena   . DU (duodenal ulcer)   . DKA (diabetic ketoacidoses) 12/14/2018  . Atherosclerosis of artery of extremity with rest pain (HCC) 08/28/2018  . PAD (peripheral artery disease) (HCC) 07/14/2018  . Abdominal pain 06/13/2018  . HTN (hypertension) 06/13/2018  . Non-insulin dependent type 2 diabetes mellitus (HCC) 06/13/2018  . Elevated lactic acid level 06/13/2018    Past Surgical History:  Procedure Laterality Date  . ABDOMINAL HYSTERECTOMY    . AMPUTATION Right 12/27/2018   Procedure: AMPUTATION BELOW KNEE;  Surgeon: Annice Needy, MD;  Location: [facility];  Service: General;  Laterality: Right;  . CATARACT EXTRACTION W/PHACO Right 03/27/2020   Procedure: CATARACT EXTRACTION PHACO AND INTRAOCULAR LENS PLACEMENT (IOC) RIGHT DIABETIC 7.54 00:52.5;  Surgeon: Galen Manila, MD;  Location: [facility];  Service: Ophthalmology;  Laterality: Right;  . CHOLECYSTECTOMY    . COLONOSCOPY WITH PROPOFOL  N/A 04/16/2019   Procedure: COLONOSCOPY WITH PROPOFOL;  Surgeon: Pasty Spillers, MD;  Location: [facility];  Service: Endoscopy;  Laterality: N/A;  . COLONOSCOPY WITH PROPOFOL N/A 01/16/2020   Procedure: COLONOSCOPY WITH PROPOFOL;  Surgeon: Pasty Spillers, MD;  Location: [facility];  Service: Endoscopy;  Laterality: N/A;  . ESOPHAGOGASTRODUODENOSCOPY (EGD) WITH PROPOFOL N/A 06/15/2018   Procedure: ESOPHAGOGASTRODUODENOSCOPY  (EGD) WITH PROPOFOL;  Surgeon: Toledo, Boykin Nearing, MD;  Location: [facility];  Service: Gastroenterology;  Laterality: N/A;  . ESOPHAGOGASTRODUODENOSCOPY (EGD) WITH PROPOFOL N/A 01/03/2019   Procedure: ESOPHAGOGASTRODUODENOSCOPY (EGD) WITH PROPOFOL;  Surgeon: Midge Minium, MD;  Location: [facility];  Service: Endoscopy;  Laterality: N/A;  . GIVENS CAPSULE STUDY  04/16/2019   Procedure: GIVENS CAPSULE STUDY;  Surgeon: Pasty Spillers, MD;  Location: [facility];  Service: Endoscopy;;  . GIVENS CAPSULE STUDY N/A 06/25/2019   Procedure: GIVENS CAPSULE STUDY;  Surgeon: Wyline Mood, MD;  Location: [facility];  Service: Gastroenterology;  Laterality: N/A;  . LOWER EXTREMITY ANGIOGRAPHY Right 08/21/2018   Procedure: LOWER EXTREMITY ANGIOGRAPHY;  Surgeon: Annice Needy, MD;  Location: [facility];  Service: Cardiovascular;  Laterality: Right;  . LOWER EXTREMITY ANGIOGRAPHY Left 08/28/2018   Procedure: LOWER EXTREMITY ANGIOGRAPHY;  Surgeon: Annice Needy, MD;  Location: [facility];  Service: Cardiovascular;  Laterality: Left;  . LOWER EXTREMITY ANGIOGRAPHY Right 08/28/2018   Procedure: Lower Extremity Angiography;  Surgeon: Annice Needy, MD;  Location: [facility];  Service: Cardiovascular;  Laterality: Right;  . LOWER EXTREMITY ANGIOGRAPHY Right 12/18/2018   Procedure: Lower Extremity Angiography;  Surgeon: Annice Needy, MD;  Location: [facility];  Service: Cardiovascular;  Laterality: Right;  . LOWER EXTREMITY ANGIOGRAPHY Right 12/18/2018   Procedure: Lower Extremity Angiography;  Surgeon: Annice Needy, MD;  Location: [facility];  Service: Cardiovascular;  Laterality: Right;  . LOWER EXTREMITY ANGIOGRAPHY Left 12/21/2018   Procedure: Lower Extremity Angiography;  Surgeon: Annice Needy, MD;  Location: [facility];  Service: Cardiovascular;  Laterality: Left;  . LOWER EXTREMITY ANGIOGRAPHY Right 12/21/2018   Procedure: Lower Extremity  Angiography;  Surgeon: Annice Needy, MD;  Location: [facility];  Service: Cardiovascular;  Laterality: Right;  . LOWER EXTREMITY INTERVENTION N/A 12/22/2018   Procedure: LOWER EXTREMITY INTERVENTION;  Surgeon: Annice Needy, MD;  Location: [facility];  Service: Cardiovascular;  Laterality: N/A;    Prior to Admission medications   Medication Sig Start Date End Date Taking? Authorizing Provider  dicyclomine (BENTYL) 10 MG capsule Take 1 capsule (10 mg total) by mouth 4 (four) times daily -  before meals and at bedtime for 5 days. 04/24/20 04/29/20 Yes Pia Mau M, PA-C  ondansetron (ZOFRAN ODT) 4 MG disintegrating tablet Take 1 tablet (4 mg total) by mouth every 8 (eight) hours as needed for up to 5 days. 04/24/20 04/29/20 Yes Pia Mau M, PA-C  acetaminophen (TYLENOL) 325 MG tablet Take 650 mg by mouth every 6 (six) hours as needed.    [provider]  albuterol (PROVENTIL HFA;VENTOLIN HFA) 108 (90 Base) MCG/ACT inhaler Inhale 2 puffs into the lungs every 6 (six) hours as needed for wheezing or shortness of breath. 08/20/17   Jeanmarie Plant, MD  apixaban (ELIQUIS) 2.5 MG TABS tablet Take 1 tablet (2.5 mg total) by mouth 2 (two) times daily. 01/30/19   Georgiana Spinner, NP  atorvastatin (LIPITOR) 10 MG tablet Take 1 tablet by mouth  daily. 08/21/18   [provider]  cetirizine (ZYRTEC) 10 MG tablet Take 10 mg by mouth daily.    [provider]  clopidogrel (PLAVIX) 75 MG tablet Take 75 mg by mouth daily. Patient not taking: Reported on 03/20/2020    [provider]  docusate sodium (COLACE) 100 MG capsule Take 100 mg by mouth 2 (two) times daily.    [provider]  DULoxetine (CYMBALTA) 30 MG capsule Take 30 mg by mouth daily.    [provider]  famotidine (PEPCID) 20 MG tablet Take 1 tablet (20 mg total) by mouth 2 (two) times daily. 10/25/19 11/24/19  Virgel Manifold, MD  ferrous sulfate 325 (65 FE) MG tablet Take 325 mg by  mouth daily.     [provider]  gabapentin (NEURONTIN) 300 MG capsule Take 2 Capsules (600 mg) at Bedtime and 1 capsule (300 mg) in the afternoon 06/14/19   Kris Hartmann, NP  lisinopril (ZESTRIL) 10 MG tablet Take 1 tablet (10 mg total) by mouth daily. 07/26/19   Lorella Nimrod, MD  Melatonin 1 MG TABS Take 1 tablet by mouth at bedtime.    [provider]  Multiple Vitamins-Minerals (WOMENS MULTIVITAMIN PO) Take 1 tablet by mouth daily.    [provider]  Na Sulfate-K Sulfate-Mg Sulf 17.5-3.13-1.6 GM/177ML SOLN At 5 PM the day before procedure take 1 bottle and 5 hours before procedure take 1 bottle. 01/09/20   Virgel Manifold, MD  polyethylene glycol-electrolytes (NULYTELY) 420 g solution Prepare according to package instructions. Starting at 5:00 PM: Drink one 8 oz glass of mixture every 15 minutes until you finish half of the jug. Five hours prior to procedure, drink 8 oz glass of mixture every 15 minutes until it is all gone. Make sure you do not drink anything 4 hours prior to your procedure. 10/25/19   Virgel Manifold, MD  rosuvastatin (CRESTOR) 5 MG tablet Take 1 tablet (5 mg total) by mouth daily at 6 PM. 07/26/19   Lorella Nimrod, MD  sitaGLIPtin (JANUVIA) 100 MG tablet Take 100 mg by mouth daily.    [provider]  vitamin B-12 (CYANOCOBALAMIN) 500 MCG tablet Take 1 tablet (500 mcg total) by mouth daily. 11/26/19   Earlie Server, MD    Allergies Metformin and related, Penicillins, and Tramadol  Family History  Problem Relation Age of Onset  . Breast cancer Mother 11    Social History Social History   Tobacco Use  . Smoking status: Current Some Day Smoker    Packs/day: 0.25    Years: 45.00    Pack years: 11.25    Types: Cigarettes  . Smokeless tobacco: Never Used  . Tobacco comment: quit  Vaping Use  . Vaping Use: Never used  Substance Use Topics  . Alcohol use: No  . Drug use: Not Currently    Comment: last used in April 2021 per  patient     Review of Systems  Constitutional: No fever/chills Eyes:  No discharge ENT: No upper respiratory complaints. Respiratory: no cough. No SOB/ use of accessory muscles to breath Gastrointestinal: Patient has abdominal discomfort and flank pain.  Musculoskeletal: Negative for musculoskeletal pain. Skin: Negative for rash, abrasions, lacerations, ecchymosis.    ____________________________________________   PHYSICAL EXAM:  VITAL SIGNS: ED Triage Vitals  Enc Vitals Group     BP 04/24/20 1326 (!) 163/77     Pulse Rate 04/24/20 1326 92     Resp 04/24/20 1326 18  Temp 04/24/20 1326 98.7 F (37.1 C)     Temp Source 04/24/20 1326 Oral     SpO2 04/24/20 1326 98 %     Weight 04/24/20 1414 141 lb (64 kg)     Height 04/24/20 1414 5\' 7"  (1.702 m)     Head Circumference --      Peak Flow --      Pain Score 04/24/20 1414 10     Pain Loc --      Pain Edu? --      Excl. in GC? --      Constitutional: Alert and oriented. Well appearing and in no acute distress. Eyes: Conjunctivae are normal. PERRL. EOMI. Head: Atraumatic. ENT:      Nose: No congestion/rhinnorhea.      Mouth/Throat: Mucous membranes are moist.  Neck: No stridor.  No cervical spine tenderness to palpation. Cardiovascular: Normal rate, regular rhythm. Normal S1 and S2.  Good peripheral circulation. Respiratory: Normal respiratory effort without tachypnea or retractions. Lungs CTAB. Good air entry to the bases with no decreased or absent breath sounds Gastrointestinal: Abdomen appears distended.  Right and left lower abdominal quadrants are tender with associated guarding. Musculoskeletal: Full range of motion to all extremities. No obvious deformities noted Neurologic:  Normal for age. No gross focal neurologic deficits are appreciated.  Skin:  Skin is warm, dry and intact. No rash noted. Psychiatric: Mood and affect are normal for age. Speech and behavior are normal.    ____________________________________________   LABS (all labs ordered are listed, but only abnormal results are displayed)  Labs Reviewed  COMPREHENSIVE METABOLIC PANEL - Abnormal; Notable for the following components:      Result Value   Glucose, Bld 138 (*)    All other components within normal limits  CBC - Abnormal; Notable for the following components:   Hemoglobin 11.8 (*)    HCT 34.8 (*)    All other components within normal limits  RESP PANEL BY RT-PCR (FLU A&B, COVID) ARPGX2  LIPASE, BLOOD  URINALYSIS, COMPLETE (UACMP) WITH MICROSCOPIC   ____________________________________________  EKG   ____________________________________________  RADIOLOGY 06/22/20, personally viewed and evaluated these images (plain radiographs) as part of my medical decision making, as well as reviewing the written report by the radiologist.    CT ABDOMEN PELVIS W CONTRAST  Result Date: 04/24/2020 CLINICAL DATA:  Bilateral flank pain for several days EXAM: CT ABDOMEN AND PELVIS WITH CONTRAST TECHNIQUE: Multidetector CT imaging of the abdomen and pelvis was performed using the standard protocol following bolus administration of intravenous contrast. CONTRAST:  06/22/2020 OMNIPAQUE IOHEXOL 300 MG/ML  SOLN COMPARISON:  12/14/2018 FINDINGS: Lower chest: No acute abnormality. Hepatobiliary: No focal liver abnormality is seen. Status post cholecystectomy. No biliary dilatation. Pancreas: Unremarkable. No pancreatic ductal dilatation or surrounding inflammatory changes. Spleen: Normal in size without focal abnormality. Adrenals/Urinary Tract: Adrenal glands are within normal limits. Kidneys demonstrate a normal enhancement pattern. Normal excretion is noted on delayed images. Tiny likely small cysts are noted in the kidneys bilaterally. No renal calculi or obstructive changes are seen. Bladder is partially distended. Stomach/Bowel: The appendix is within normal limits. No obstructive or inflammatory  changes of the large or small bowel are seen. Small bowel and stomach are within normal limits. Vascular/Lymphatic: Aortic atherosclerosis. No enlarged abdominal or pelvic lymph nodes. Iliac arterial stents are noted bilaterally. Bilateral common femoral arterial stents are noted as well. Reproductive: Status post hysterectomy. No adnexal masses. Other: No abdominal wall hernia or abnormality. No  abdominopelvic ascites. Musculoskeletal: Degenerative changes of lumbar spine are noted. IMPRESSION: No acute abnormality is noted to correspond with the patient's given clinical history. Chronic changes are noted similar to that seen on prior exam. Electronically Signed   By: Alcide Clever M.D.   On: 04/24/2020 16:43    ____________________________________________    PROCEDURES  Procedure(s) performed:     Procedures     Medications  iohexol (OMNIPAQUE) 300 MG/ML solution 100 mL (100 mLs Intravenous Contrast Given 04/24/20 1617)     ____________________________________________   INITIAL IMPRESSION / ASSESSMENT AND PLAN / ED COURSE  Pertinent labs & imaging results that were available during my care of the patient were reviewed by me and considered in my medical decision making (see chart for details).       Assessment and Plan:  Abdominal pain Dizziness 65 year old female presents to the emergency department with abdominal discomfort that started on Tuesday with some nausea and diarrhea.  Patient was hypertensive at triage but vital signs were otherwise reassuring.  Patient tested positive for COVID-19 while in the emergency department.  CBC and CMP were reassuring.  CT abdomen and pelvis revealed no acute abnormality.  Lipase is within reference range.  We will send patient home with Zofran and Bentyl.  Rest and hydration were encouraged at home.  Return precautions were given to return with new or worsening symptoms.   ____________________________________________  FINAL CLINICAL  IMPRESSION(S) / ED DIAGNOSES  Final diagnoses:  COVID-19      NEW MEDICATIONS STARTED DURING THIS VISIT:  ED Discharge Orders         Ordered    ondansetron (ZOFRAN ODT) 4 MG disintegrating tablet  Every 8 hours PRN        04/24/20 1705    dicyclomine (BENTYL) 10 MG capsule  3 times daily before meals & bedtime        04/24/20 1705              This chart was dictated using voice recognition software/Dragon. Despite best efforts to proofread, errors can occur which can change the meaning. Any change was purely unintentional.     Orvil Feil, PA-C 04/24/20 1712    Phineas Semen, MD 04/24/20 1725

## 2020-04-25 ENCOUNTER — Telehealth: Payer: Self-pay

## 2020-04-25 NOTE — Telephone Encounter (Signed)
I connected by phone with Sylvia [name] on 04/25/2020 at 11:38 AM to discuss the potential use of a new treatment for mild to moderate COVID-19 viral infection in non-hospitalized patients.  This patient is a 65 y.o. female that meets the FDA criteria for Emergency Use Authorization of COVID monoclonal antibody sotrovimab.  Has a (+) direct SARS-CoV-2 viral test result  Has mild or moderate COVID-19   Is NOT hospitalized due to COVID-19  Is within 10 days of symptom onset  Has at least one of the high risk factor(s) for progression to severe COVID-19 and/or hospitalization as defined in EUA.  Specific high risk criteria : Older age (>/= 66 yo)   I have spoken and communicated the following to the patient or parent/caregiver regarding COVID monoclonal antibody treatment:  1. FDA has authorized the emergency use for the treatment of mild to moderate COVID-19 in adults and pediatric patients with positive results of direct SARS-CoV-2 viral testing who are 19 years of age and older weighing at least 40 kg, and who are at high risk for progressing to severe COVID-19 and/or hospitalization.  2. The significant known and potential risks and benefits of COVID monoclonal antibody, and the extent to which such potential risks and benefits are unknown.  3. Information on available alternative treatments and the risks and benefits of those alternatives, including clinical trials.  4. Patients treated with COVID monoclonal antibody should continue to self-isolate and use infection control measures (e.g., wear mask, isolate, social distance, avoid sharing personal items, clean and disinfect "high touch" surfaces, and frequent handwashing) according to CDC guidelines.   5. The patient or parent/caregiver has the option to accept or refuse COVID monoclonal antibody treatment.  After reviewing this information with the patient, the patient has DECLINED offer to receive the infusion. Sylvia Hardy,  RN 04/25/2020 11:38 AM Pt. Is on day seven of symptoms and states she is feeling better. Declines treatment at this time.

## 2020-05-12 ENCOUNTER — Encounter: Payer: Self-pay | Admitting: Ophthalmology

## 2020-05-12 ENCOUNTER — Other Ambulatory Visit: Payer: Self-pay

## 2020-05-16 ENCOUNTER — Other Ambulatory Visit: Admission: RE | Admit: 2020-05-16 | Payer: Medicare Other | Source: Ambulatory Visit

## 2020-05-16 NOTE — Discharge Instructions (Signed)

## 2020-05-19 NOTE — Anesthesia Preprocedure Evaluation (Addendum)
Anesthesia Evaluation  Patient identified by MRN, date of birth, ID band Patient awake    Reviewed: Allergy & Precautions, NPO status , Patient's Chart, lab work & pertinent test results  History of Anesthesia Complications Negative for: history of anesthetic complications  Airway Mallampati: IV   Neck ROM: Full    Dental  (+) Edentulous Lower, Edentulous Upper   Pulmonary COPD, Current Smoker (5 cigarettes per day)Patient did not abstain from smoking.,  COVID+ 04/24/20   Pulmonary exam normal breath sounds clear to auscultation       Cardiovascular hypertension, + Peripheral Vascular Disease (s/p right BKA on Eliquis)  Normal cardiovascular exam Rhythm:Regular Rate:Normal     Neuro/Psych PSYCHIATRIC DISORDERS Anxiety    GI/Hepatic GERD  ,  Endo/Other  diabetes, Type 2  Renal/GU negative Renal ROS     Musculoskeletal   Abdominal   Peds  Hematology  (+) Blood dyscrasia, anemia ,   Anesthesia Other Findings   Reproductive/Obstetrics                            Anesthesia Physical Anesthesia Plan  ASA: IV  Anesthesia Plan: MAC   Post-op Pain Management:    Induction: Intravenous  PONV Risk Score and Plan: 1 and Treatment may vary due to age or medical condition, TIVA and Midazolam  Airway Management Planned: Nasal Cannula  Additional Equipment:   Intra-op Plan:   Post-operative Plan:   Informed Consent: I have reviewed the patients History and Physical, chart, labs and discussed the procedure including the risks, benefits and alternatives for the proposed anesthesia with the patient or authorized representative who has indicated his/her understanding and acceptance.       Plan Discussed with: CRNA  Anesthesia Plan Comments:        Anesthesia Quick Evaluation

## 2020-05-20 ENCOUNTER — Ambulatory Visit: Payer: Medicare Other | Admitting: Anesthesiology

## 2020-05-20 ENCOUNTER — Encounter: Payer: Self-pay | Admitting: Ophthalmology

## 2020-05-20 ENCOUNTER — Encounter
Admission: RE | Disposition: A | Discharge status: Home / Self Care | Payer: Self-pay | Source: Home / Self Care | Attending: Ophthalmology

## 2020-05-20 ENCOUNTER — Ambulatory Visit
Admission: RE | Admit: 2020-05-20 | Discharge: 2020-05-20 | Disposition: A | Discharge status: Home / Self Care | Payer: Medicare Other | Attending: Ophthalmology | Admitting: Ophthalmology

## 2020-05-20 ENCOUNTER — Other Ambulatory Visit: Payer: Self-pay

## 2020-05-20 DIAGNOSIS — F17210 Nicotine dependence, cigarettes, uncomplicated: Secondary | ICD-10-CM | POA: Diagnosis not present

## 2020-05-20 DIAGNOSIS — E1136 Type 2 diabetes mellitus with diabetic cataract: Secondary | ICD-10-CM | POA: Diagnosis not present

## 2020-05-20 DIAGNOSIS — Z885 Allergy status to narcotic agent status: Secondary | ICD-10-CM | POA: Diagnosis not present

## 2020-05-20 DIAGNOSIS — Z7984 Long term (current) use of oral hypoglycemic drugs: Secondary | ICD-10-CM | POA: Diagnosis not present

## 2020-05-20 DIAGNOSIS — Z888 Allergy status to other drugs, medicaments and biological substances status: Secondary | ICD-10-CM | POA: Insufficient documentation

## 2020-05-20 DIAGNOSIS — Z803 Family history of malignant neoplasm of breast: Secondary | ICD-10-CM | POA: Diagnosis not present

## 2020-05-20 DIAGNOSIS — Z79899 Other long term (current) drug therapy: Secondary | ICD-10-CM | POA: Insufficient documentation

## 2020-05-20 DIAGNOSIS — Z8616 Personal history of COVID-19: Secondary | ICD-10-CM | POA: Insufficient documentation

## 2020-05-20 DIAGNOSIS — Z880 Allergy status to penicillin: Secondary | ICD-10-CM | POA: Insufficient documentation

## 2020-05-20 DIAGNOSIS — H2512 Age-related nuclear cataract, left eye: Secondary | ICD-10-CM | POA: Diagnosis present

## 2020-05-20 DIAGNOSIS — Z89511 Acquired absence of right leg below knee: Secondary | ICD-10-CM | POA: Insufficient documentation

## 2020-05-20 DIAGNOSIS — Z7901 Long term (current) use of anticoagulants: Secondary | ICD-10-CM | POA: Insufficient documentation

## 2020-05-20 DIAGNOSIS — Z9841 Cataract extraction status, right eye: Secondary | ICD-10-CM | POA: Insufficient documentation

## 2020-05-20 DIAGNOSIS — Z961 Presence of intraocular lens: Secondary | ICD-10-CM | POA: Insufficient documentation

## 2020-05-20 HISTORY — PX: CATARACT EXTRACTION W/PHACO: SHX586

## 2020-05-20 LAB — GLUCOSE, CAPILLARY
Glucose-Capillary: 214 mg/dL — ABNORMAL HIGH (ref 70–99)
Glucose-Capillary: 257 mg/dL — ABNORMAL HIGH (ref 70–99)

## 2020-05-20 SURGERY — PHACOEMULSIFICATION, CATARACT, WITH IOL INSERTION
Anesthesia: Monitor Anesthesia Care | Site: Eye | Laterality: Left

## 2020-05-20 MED ORDER — ARMC OPHTHALMIC DILATING DROPS
1.0000 "application " | OPHTHALMIC | Status: DC | PRN
  Administered 2020-05-20 (×3): 1 via OPHTHALMIC

## 2020-05-20 MED ORDER — MIDAZOLAM HCL 2 MG/2ML IJ SOLN
INTRAMUSCULAR | Status: DC | PRN
  Administered 2020-05-20: 2 mg via INTRAVENOUS

## 2020-05-20 MED ORDER — TETRACAINE HCL 0.5 % OP SOLN
1.0000 [drp] | OPHTHALMIC | Status: DC | PRN
  Administered 2020-05-20 (×3): 1 [drp] via OPHTHALMIC

## 2020-05-20 MED ORDER — NA CHONDROIT SULF-NA HYALURON 40-17 MG/ML IO SOLN
INTRAOCULAR | Status: DC | PRN
  Administered 2020-05-20: 1 mL via INTRAOCULAR

## 2020-05-20 MED ORDER — ACETAMINOPHEN 325 MG PO TABS: 650.0000 mg | ORAL_TABLET | Freq: Once | ORAL | Status: DC | PRN

## 2020-05-20 MED ORDER — LIDOCAINE HCL (PF) 2 % IJ SOLN
INTRAOCULAR | Status: DC | PRN
  Administered 2020-05-20: 1 mL

## 2020-05-20 MED ORDER — MOXIFLOXACIN HCL 0.5 % OP SOLN
OPHTHALMIC | Status: DC | PRN
  Administered 2020-05-20: 0.2 mL via OPHTHALMIC

## 2020-05-20 MED ORDER — LACTATED RINGERS IV SOLN: INTRAVENOUS | Status: DC

## 2020-05-20 MED ORDER — BRIMONIDINE TARTRATE-TIMOLOL 0.2-0.5 % OP SOLN
OPHTHALMIC | Status: DC | PRN
  Administered 2020-05-20: 1 [drp] via OPHTHALMIC

## 2020-05-20 MED ORDER — ACETAMINOPHEN 160 MG/5ML PO SOLN
325.0000 mg | ORAL | Status: DC | PRN
Start: 2020-05-20 — End: 2020-05-20

## 2020-05-20 MED ORDER — FENTANYL CITRATE (PF) 100 MCG/2ML IJ SOLN
INTRAMUSCULAR | Status: DC | PRN
  Administered 2020-05-20: 50 ug via INTRAVENOUS

## 2020-05-20 MED ORDER — ONDANSETRON HCL 4 MG/2ML IJ SOLN: 4.0000 mg | Freq: Once | INTRAMUSCULAR | Status: DC | PRN

## 2020-05-20 SURGICAL SUPPLY — 19 items
CANNULA ANT/CHMB 27G (MISCELLANEOUS) ×2 IMPLANT
CANNULA ANT/CHMB 27GA (MISCELLANEOUS) ×4 IMPLANT
GLOVE SURG LX 8.0 MICRO (GLOVE) ×1
GLOVE SURG LX STRL 8.0 MICRO (GLOVE) ×1 IMPLANT
GLOVE SURG TRIUMPH 8.0 PF LTX (GLOVE) ×2 IMPLANT
GOWN STRL REUS W/ TWL LRG LVL3 (GOWN DISPOSABLE) ×2 IMPLANT
GOWN STRL REUS W/TWL LRG LVL3 (GOWN DISPOSABLE) ×4
LENS IOL TECNIS EYHANCE 20.5 (Intraocular Lens) ×1 IMPLANT
MARKER SKIN DUAL TIP RULER LAB (MISCELLANEOUS) ×2 IMPLANT
NDL FILTER BLUNT 18X1 1/2 (NEEDLE) ×1 IMPLANT
NEEDLE FILTER BLUNT 18X 1/2SAF (NEEDLE) ×1
NEEDLE FILTER BLUNT 18X1 1/2 (NEEDLE) ×1 IMPLANT
PACK EYE AFTER SURG (MISCELLANEOUS) ×2 IMPLANT
PACK OPTHALMIC (MISCELLANEOUS) ×2 IMPLANT
PACK PORFILIO (MISCELLANEOUS) ×2 IMPLANT
SYR 3ML LL SCALE MARK (SYRINGE) ×2 IMPLANT
SYR TB 1ML LUER SLIP (SYRINGE) ×2 IMPLANT
WATER STERILE IRR 250ML POUR (IV SOLUTION) ×2 IMPLANT
WIPE NON LINTING 3.25X3.25 (MISCELLANEOUS) ×2 IMPLANT

## 2020-05-20 NOTE — Transfer of Care (Signed)
Immediate Anesthesia Transfer of Care Note  Patient: Sylvia [name]  Procedure(s) Performed: CATARACT EXTRACTION PHACO AND INTRAOCULAR LENS PLACEMENT (IOC) LEFT DIABETIC 4.95 00:37.6 (Left Eye)  Patient Location: PACU  Anesthesia Type: MAC  Level of Consciousness: awake, alert  and patient cooperative  Airway and Oxygen Therapy: Patient Spontanous Breathing and Patient connected to supplemental oxygen  Post-op Assessment: Post-op Vital signs reviewed, Patient's Cardiovascular Status Stable, Respiratory Function Stable, Patent Airway and No signs of Nausea or vomiting  Post-op Vital Signs: Reviewed and stable  Complications: No complications documented.

## 2020-05-20 NOTE — Anesthesia Postprocedure Evaluation (Signed)
Anesthesia Post Note  Patient: Sylvia [name]  Procedure(s) Performed: CATARACT EXTRACTION PHACO AND INTRAOCULAR LENS PLACEMENT (IOC) LEFT DIABETIC 4.95 00:37.6 (Left Eye)     Patient location during evaluation: PACU Anesthesia Type: MAC Level of consciousness: awake and alert, oriented and patient cooperative Pain management: pain level controlled Vital Signs Assessment: post-procedure vital signs reviewed and stable Respiratory status: spontaneous breathing, nonlabored ventilation and respiratory function stable Cardiovascular status: blood pressure returned to baseline and stable Postop Assessment: adequate PO intake Anesthetic complications: no   No complications documented.  Darrin Nipper

## 2020-05-20 NOTE — Op Note (Signed)
PREOPERATIVE DIAGNOSIS:  Nuclear sclerotic cataract of the left eye.   POSTOPERATIVE DIAGNOSIS:  Nuclear sclerotic cataract of the left eye.   OPERATIVE PROCEDURE:@   SURGEON:  Galen Manila, MD.   ANESTHESIA:  Anesthesiologist: Reed Breech, MD CRNA: Michaele Offer, CRNA  1.      Managed anesthesia care. 2.     0.51ml of Shugarcaine was instilled following the paracentesis   COMPLICATIONS:  None.   TECHNIQUE:   Stop and chop   DESCRIPTION OF PROCEDURE:  The patient was examined and consented in the preoperative holding area where the aforementioned topical anesthesia was applied to the left eye and then brought back to the Operating Room where the left eye was prepped and draped in the usual sterile ophthalmic fashion and a lid speculum was placed. A paracentesis was created with the side port blade and the anterior chamber was filled with viscoelastic. A near clear corneal incision was performed with the steel keratome. A continuous curvilinear capsulorrhexis was performed with a cystotome followed by the capsulorrhexis forceps. Hydrodissection and hydrodelineation were carried out with BSS on a blunt cannula. The lens was removed in a stop and chop  technique and the remaining cortical material was removed with the irrigation-aspiration handpiece. The capsular bag was inflated with viscoelastic and the Technis [id number] lens was placed in the capsular bag without complication. The remaining viscoelastic was removed from the eye with the irrigation-aspiration handpiece. The wounds were hydrated. The anterior chamber was flushed with BSS and the eye was inflated to physiologic pressure. 0.84ml Vigamox was placed in the anterior chamber. The wounds were found to be water tight. The eye was dressed with Combigan. The patient was given protective glasses to wear throughout the day and a shield with which to sleep tonight. The patient was also given drops with which to begin a drop regimen today and  will follow-up with me in one day. Implant Name Type Inv. Item Serial No. Manufacturer Lot No. LRB No. Used Action  LENS IOL TECNIS EYHANCE 20.5 - [id number] Intraocular Lens LENS IOL TECNIS EYHANCE 20.5 [id number] JOHNSON   Left 1 Implanted    Procedure(s) with comments: CATARACT EXTRACTION PHACO AND INTRAOCULAR LENS PLACEMENT (IOC) LEFT DIABETIC 4.95 00:37.6 (Left) - Diabetic - oral meds COVID + 04-24-20  Electronically signed: Galen Manila 05/20/2020 7:44 AM

## 2020-05-20 NOTE — Anesthesia Procedure Notes (Signed)
Procedure Name: MAC Date/Time: 05/20/2020 7:28 AM Performed by: Silvana Newness, CRNA Pre-anesthesia Checklist: Patient identified, Emergency Drugs available, Suction available, Patient being monitored and Timeout performed Oxygen Delivery Method: Nasal cannula Placement Confirmation: positive ETCO2

## 2020-05-20 NOTE — H&P (Signed)
[facility]   Primary Care Physician:  Center, [facility] Ophthalmologist: Dr. Druscilla Brownie  Pre-Procedure History & Physical: HPI:  Sylvia [name] is a 65 y.o. female here for cataract surgery.   Past Medical History:  Diagnosis Date  . Anemia of chronic disease 05/16/2019  . Anxiety    h/o  . COPD (chronic obstructive pulmonary disease) (HCC)   . Diabetes mellitus without complication (HCC)   . GERD (gastroesophageal reflux disease)   . Hypertension    bp under control-off meds since 2019    Past Surgical History:  Procedure Laterality Date  . ABDOMINAL HYSTERECTOMY    . AMPUTATION Right 12/27/2018   Procedure: AMPUTATION BELOW KNEE;  Surgeon: Annice Needy, MD;  Location: [facility];  Service: General;  Laterality: Right;  . CATARACT EXTRACTION W/PHACO Right 03/27/2020   Procedure: CATARACT EXTRACTION PHACO AND INTRAOCULAR LENS PLACEMENT (IOC) RIGHT DIABETIC 7.54 00:52.5;  Surgeon: Galen Manila, MD;  Location: [facility];  Service: Ophthalmology;  Laterality: Right;  . CHOLECYSTECTOMY    . COLONOSCOPY WITH PROPOFOL N/A 04/16/2019   Procedure: COLONOSCOPY WITH PROPOFOL;  Surgeon: Pasty Spillers, MD;  Location: [facility];  Service: Endoscopy;  Laterality: N/A;  . COLONOSCOPY WITH PROPOFOL N/A 01/16/2020   Procedure: COLONOSCOPY WITH PROPOFOL;  Surgeon: Pasty Spillers, MD;  Location: [facility];  Service: Endoscopy;  Laterality: N/A;  . ESOPHAGOGASTRODUODENOSCOPY (EGD) WITH PROPOFOL N/A 06/15/2018   Procedure: ESOPHAGOGASTRODUODENOSCOPY (EGD) WITH PROPOFOL;  Surgeon: Toledo, Boykin Nearing, MD;  Location: [facility];  Service: Gastroenterology;  Laterality: N/A;  . ESOPHAGOGASTRODUODENOSCOPY (EGD) WITH PROPOFOL N/A 01/03/2019   Procedure: ESOPHAGOGASTRODUODENOSCOPY (EGD) WITH PROPOFOL;  Surgeon: Midge Minium, MD;  Location: [facility];  Service: Endoscopy;  Laterality: N/A;  . GIVENS CAPSULE STUDY  04/16/2019   Procedure:  GIVENS CAPSULE STUDY;  Surgeon: Pasty Spillers, MD;  Location: [facility];  Service: Endoscopy;;  . GIVENS CAPSULE STUDY N/A 06/25/2019   Procedure: GIVENS CAPSULE STUDY;  Surgeon: Wyline Mood, MD;  Location: [facility];  Service: Gastroenterology;  Laterality: N/A;  . LOWER EXTREMITY ANGIOGRAPHY Right 08/21/2018   Procedure: LOWER EXTREMITY ANGIOGRAPHY;  Surgeon: Annice Needy, MD;  Location: [facility];  Service: Cardiovascular;  Laterality: Right;  . LOWER EXTREMITY ANGIOGRAPHY Left 08/28/2018   Procedure: LOWER EXTREMITY ANGIOGRAPHY;  Surgeon: Annice Needy, MD;  Location: [facility];  Service: Cardiovascular;  Laterality: Left;  . LOWER EXTREMITY ANGIOGRAPHY Right 08/28/2018   Procedure: Lower Extremity Angiography;  Surgeon: Annice Needy, MD;  Location: [facility];  Service: Cardiovascular;  Laterality: Right;  . LOWER EXTREMITY ANGIOGRAPHY Right 12/18/2018   Procedure: Lower Extremity Angiography;  Surgeon: Annice Needy, MD;  Location: [facility];  Service: Cardiovascular;  Laterality: Right;  . LOWER EXTREMITY ANGIOGRAPHY Right 12/18/2018   Procedure: Lower Extremity Angiography;  Surgeon: Annice Needy, MD;  Location: [facility];  Service: Cardiovascular;  Laterality: Right;  . LOWER EXTREMITY ANGIOGRAPHY Left 12/21/2018   Procedure: Lower Extremity Angiography;  Surgeon: Annice Needy, MD;  Location: [facility];  Service: Cardiovascular;  Laterality: Left;  . LOWER EXTREMITY ANGIOGRAPHY Right 12/21/2018   Procedure: Lower Extremity Angiography;  Surgeon: Annice Needy, MD;  Location: [facility];  Service: Cardiovascular;  Laterality: Right;  . LOWER EXTREMITY INTERVENTION N/A 12/22/2018   Procedure: LOWER EXTREMITY INTERVENTION;  Surgeon: Annice Needy, MD;  Location: [facility];  Service: Cardiovascular;  Laterality:  N/A;    Prior to Admission medications   Medication Sig Start Date End Date Taking? Authorizing  Provider  acetaminophen (TYLENOL) 325 MG tablet Take 650 mg by mouth every 6 (six) hours as needed.   Yes [provider]  albuterol (PROVENTIL HFA;VENTOLIN HFA) 108 (90 Base) MCG/ACT inhaler Inhale 2 puffs into the lungs every 6 (six) hours as needed for wheezing or shortness of breath. 08/20/17  Yes Jeanmarie Plant, MD  apixaban (ELIQUIS) 2.5 MG TABS tablet Take 1 tablet (2.5 mg total) by mouth 2 (two) times daily. 01/30/19  Yes Georgiana Spinner, NP  atorvastatin (LIPITOR) 10 MG tablet Take 1 tablet by mouth daily. 08/21/18  Yes [provider]  cetirizine (ZYRTEC) 10 MG tablet Take 10 mg by mouth daily.   Yes [provider]  docusate sodium (COLACE) 100 MG capsule Take 100 mg by mouth 2 (two) times daily.   Yes [provider]  DULoxetine (CYMBALTA) 30 MG capsule Take 30 mg by mouth daily.   Yes [provider]  famotidine (PEPCID) 20 MG tablet Take 1 tablet (20 mg total) by mouth 2 (two) times daily. 10/25/19 11/24/19 Yes Tahiliani, Michel Bickers B, MD  ferrous sulfate 325 (65 FE) MG tablet Take 325 mg by mouth daily.    Yes [provider]  gabapentin (NEURONTIN) 300 MG capsule Take 2 Capsules (600 mg) at Bedtime and 1 capsule (300 mg) in the afternoon 06/14/19  Yes Georgiana Spinner, NP  lisinopril (ZESTRIL) 10 MG tablet Take 1 tablet (10 mg total) by mouth daily. 07/26/19  Yes Arnetha Courser, MD  Melatonin 1 MG TABS Take 1 tablet by mouth at bedtime.   Yes [provider]  Multiple Vitamins-Minerals (WOMENS MULTIVITAMIN PO) Take 1 tablet by mouth daily.   Yes [provider]  rosuvastatin (CRESTOR) 5 MG tablet Take 1 tablet (5 mg total) by mouth daily at 6 PM. 07/26/19  Yes Arnetha Courser, MD  sitaGLIPtin (JANUVIA) 100 MG tablet Take 100 mg by mouth daily.   Yes [provider]  vitamin B-12 (CYANOCOBALAMIN) 500 MCG tablet Take 1 tablet (500 mcg total) by mouth daily. 11/26/19  Yes Rickard Patience, MD  Na Sulfate-K Sulfate-Mg Sulf  17.5-3.13-1.6 GM/177ML SOLN At 5 PM the day before procedure take 1 bottle and 5 hours before procedure take 1 bottle. Patient not taking: Reported on 05/12/2020 01/09/20   Pasty Spillers, MD    Allergies as of 04/02/2020 - Review Complete 03/27/2020  Allergen Reaction Noted  . Metformin and related Diarrhea 06/04/2019  . Penicillins Hives 11/14/2015  . Tramadol Itching 12/14/2018    Family History  Problem Relation Age of Onset  . Breast cancer Mother 42    Social History   Socioeconomic History  . Marital status: Divorced    Spouse name: Not on file  . Number of children: Not on file  . Years of education: Not on file  . Highest education level: Not on file  Occupational History  . Not on file  Tobacco Use  . Smoking status: Current Some Day Smoker    Packs/day: 0.25    Years: 45.00    Pack years: 11.25    Types: Cigarettes  . Smokeless tobacco: Never Used  . Tobacco comment: quit  Vaping Use  . Vaping Use: Never used  Substance and Sexual Activity  . Alcohol use: No  . Drug use: Not Currently    Comment: last used in April 2021 per patient  . Sexual  activity: Not Currently  Other Topics Concern  . Not on file  Social History Narrative  . Not on file   Social Determinants of Health   Financial Resource Strain: Not on file  Food Insecurity: Not on file  Transportation Needs: Not on file  Physical Activity: Not on file  Stress: Not on file  Social Connections: Not on file  Intimate Partner Violence: Not on file    Review of Systems: See HPI, otherwise negative ROS  Physical Exam: BP (!) 145/77   Pulse (!) 104   Temp (!) 97 F (36.1 C) (Temporal)   Ht 5\' 6"  (1.676 m)   Wt 66.7 kg   SpO2 100%   BMI 23.73 kg/m  General:   Alert,  pleasant and cooperative in NAD Head:  Normocephalic and atraumatic. Respiratory:  Normal work of breathing.  Impression/Plan: Sylvia [name] is here for cataract surgery.  Risks, benefits, limitations, and  alternatives regarding cataract surgery have been reviewed with the patient.  Questions have been answered.  All parties agreeable.   Galen Manila, MD  05/20/2020, 7:16 AM

## 2020-05-21 ENCOUNTER — Encounter: Payer: Self-pay | Admitting: Ophthalmology

## 2020-05-26 ENCOUNTER — Inpatient Hospital Stay: Payer: Medicare Other

## 2020-05-28 ENCOUNTER — Inpatient Hospital Stay: Payer: Medicare Other | Attending: Oncology

## 2020-05-28 ENCOUNTER — Inpatient Hospital Stay: Payer: Medicare Other | Admitting: Oncology

## 2020-05-28 ENCOUNTER — Telehealth: Payer: Self-pay

## 2020-05-28 DIAGNOSIS — Z803 Family history of malignant neoplasm of breast: Secondary | ICD-10-CM | POA: Diagnosis not present

## 2020-05-28 DIAGNOSIS — F419 Anxiety disorder, unspecified: Secondary | ICD-10-CM | POA: Diagnosis not present

## 2020-05-28 DIAGNOSIS — D509 Iron deficiency anemia, unspecified: Secondary | ICD-10-CM | POA: Insufficient documentation

## 2020-05-28 DIAGNOSIS — J449 Chronic obstructive pulmonary disease, unspecified: Secondary | ICD-10-CM | POA: Insufficient documentation

## 2020-05-28 DIAGNOSIS — E1165 Type 2 diabetes mellitus with hyperglycemia: Secondary | ICD-10-CM | POA: Diagnosis not present

## 2020-05-28 DIAGNOSIS — Z79899 Other long term (current) drug therapy: Secondary | ICD-10-CM | POA: Diagnosis not present

## 2020-05-28 DIAGNOSIS — Z8616 Personal history of COVID-19: Secondary | ICD-10-CM | POA: Diagnosis not present

## 2020-05-28 DIAGNOSIS — R700 Elevated erythrocyte sedimentation rate: Secondary | ICD-10-CM | POA: Insufficient documentation

## 2020-05-28 DIAGNOSIS — K219 Gastro-esophageal reflux disease without esophagitis: Secondary | ICD-10-CM | POA: Insufficient documentation

## 2020-05-28 DIAGNOSIS — Z7984 Long term (current) use of oral hypoglycemic drugs: Secondary | ICD-10-CM | POA: Diagnosis not present

## 2020-05-28 DIAGNOSIS — Z7901 Long term (current) use of anticoagulants: Secondary | ICD-10-CM | POA: Diagnosis not present

## 2020-05-28 DIAGNOSIS — Z89511 Acquired absence of right leg below knee: Secondary | ICD-10-CM | POA: Insufficient documentation

## 2020-05-28 DIAGNOSIS — I10 Essential (primary) hypertension: Secondary | ICD-10-CM | POA: Diagnosis not present

## 2020-05-28 DIAGNOSIS — D649 Anemia, unspecified: Secondary | ICD-10-CM

## 2020-05-28 DIAGNOSIS — F17210 Nicotine dependence, cigarettes, uncomplicated: Secondary | ICD-10-CM | POA: Diagnosis not present

## 2020-05-28 LAB — CBC WITH DIFFERENTIAL/PLATELET
Abs Immature Granulocytes: 0.05 10*3/uL (ref 0.00–0.07)
Basophils Absolute: 0.1 10*3/uL (ref 0.0–0.1)
Basophils Relative: 1 %
Eosinophils Absolute: 0.3 10*3/uL (ref 0.0–0.5)
Eosinophils Relative: 3 %
HCT: 31.1 % — ABNORMAL LOW (ref 36.0–46.0)
Hemoglobin: 11.1 g/dL — ABNORMAL LOW (ref 12.0–15.0)
Immature Granulocytes: 1 %
Lymphocytes Relative: 26 %
Lymphs Abs: 2.3 10*3/uL (ref 0.7–4.0)
MCH: 29.4 pg (ref 26.0–34.0)
MCHC: 35.7 g/dL (ref 30.0–36.0)
MCV: 82.5 fL (ref 80.0–100.0)
Monocytes Absolute: 0.7 10*3/uL (ref 0.1–1.0)
Monocytes Relative: 7 %
Neutro Abs: 5.5 10*3/uL (ref 1.7–7.7)
Neutrophils Relative %: 62 %
Platelets: 340 10*3/uL (ref 150–400)
RBC: 3.77 MIL/uL — ABNORMAL LOW (ref 3.87–5.11)
RDW: 13.8 % (ref 11.5–15.5)
WBC: 8.8 10*3/uL (ref 4.0–10.5)
nRBC: 0 % (ref 0.0–0.2)

## 2020-05-28 LAB — IRON AND TIBC
Iron: 78 ug/dL (ref 28–170)
Saturation Ratios: 22 % (ref 10.4–31.8)
TIBC: 357 ug/dL (ref 250–450)
UIBC: 279 ug/dL

## 2020-05-28 LAB — COMPREHENSIVE METABOLIC PANEL
ALT: 17 U/L (ref 0–44)
AST: 22 U/L (ref 15–41)
Albumin: 3.9 g/dL (ref 3.5–5.0)
Alkaline Phosphatase: 92 U/L (ref 38–126)
Anion gap: 12 (ref 5–15)
BUN: 13 mg/dL (ref 8–23)
CO2: 19 mmol/L — ABNORMAL LOW (ref 22–32)
Calcium: 9.4 mg/dL (ref 8.9–10.3)
Chloride: 106 mmol/L (ref 98–111)
Creatinine, Ser: 0.84 mg/dL (ref 0.44–1.00)
GFR, Estimated: >60 mL/min (ref >60)
Glucose, Bld: 320 mg/dL — ABNORMAL HIGH (ref 70–99)
Potassium: 3.8 mmol/L (ref 3.5–5.1)
Sodium: 137 mmol/L (ref 135–145)
Total Bilirubin: 0.8 mg/dL (ref 0.3–1.2)
Total Protein: 7.9 g/dL (ref 6.5–8.1)

## 2020-05-28 LAB — VITAMIN B12: Vitamin B-12: 521 pg/mL (ref 180–914)

## 2020-05-28 LAB — FERRITIN: Ferritin: 90 ng/mL (ref 11–307)

## 2020-05-28 NOTE — Telephone Encounter (Signed)
Got a records request from disability of determination services. Faxed records from 08/18/2019 to current

## 2020-05-30 ENCOUNTER — Encounter: Payer: Self-pay | Admitting: Oncology

## 2020-05-30 ENCOUNTER — Other Ambulatory Visit: Payer: Self-pay

## 2020-05-30 ENCOUNTER — Inpatient Hospital Stay (HOSPITAL_BASED_OUTPATIENT_CLINIC_OR_DEPARTMENT_OTHER): Payer: Medicare Other | Admitting: Oncology

## 2020-05-30 VITALS — BP 160/88 | HR 108 | Temp 98.1°F | Wt 143.5 lb

## 2020-05-30 DIAGNOSIS — M2550 Pain in unspecified joint: Secondary | ICD-10-CM | POA: Diagnosis not present

## 2020-05-30 DIAGNOSIS — D638 Anemia in other chronic diseases classified elsewhere: Secondary | ICD-10-CM | POA: Diagnosis not present

## 2020-05-30 DIAGNOSIS — D509 Iron deficiency anemia, unspecified: Secondary | ICD-10-CM | POA: Diagnosis not present

## 2020-05-30 DIAGNOSIS — Z8719 Personal history of other diseases of the digestive system: Secondary | ICD-10-CM | POA: Diagnosis not present

## 2020-05-30 DIAGNOSIS — R739 Hyperglycemia, unspecified: Secondary | ICD-10-CM | POA: Diagnosis not present

## 2020-05-30 NOTE — Progress Notes (Signed)
Patient denies new problems/concerns today.   °

## 2020-05-30 NOTE — Progress Notes (Signed)
Hematology/Oncology  note [facility] [phone] [phone]   Patient Care Team: Center, [facility] as PCP - General (General Practice) Earlie Server, MD as Consulting Physician (Hematology and Oncology)  REFERRING PROVIDER: Center, [place] FOR VISIT:  Follow up of iron deficiency anemia  HISTORY OF PRESENTING ILLNESS:  Sylvia [name] is a  65 y.o.  female with PMH listed below was seen in consultation at the request of Center, [place]*   for evaluation of iron deficiency anemia.   Reviewed patient's recent labs  05/09/2019, hemoglobin 8.1, MCV 88, platelet 470. 04/16/2019, iron panel showed saturation 18, ferritin 28, TIBC 308 Patient has been started on oral iron supplementation NiFerrex 150 mg daily.  She reports tolerating well.  Reviewed patient's previous labs ordered by primary care physician's office, anemia is chronic onset , duration is since at least July 2017.  Patient's hemoglobin was about 9 back in September 2020.  She was admitted at that time due to severe PAD and right foot gangrene.  Status post right below-knee amputation.   She was admitted in December due to acute drop of hemoglobin to 6.  She has a history of GI bleed secondary to gastric ulcer.  Received PRBC transfusion.  Patient is on Eliquis 2.5 mg twice daily. No aggravating or improving factors.  Associated signs and symptoms: Patient reports fatigue.  Denies SOB with exertion.  Denies  easy bruising, hematochezia, hemoptysis, hematuria.  She has lost 15 pounds of weight recently. Context:  History of iron deficiency:  Rectal bleeding: Positive for melena.  No recent rectal bleeding. Menstrual bleeding/ Vaginal bleeding : Denies Hematemesis or hemoptysis : denies Blood in urine : denies  Last endoscopy: 04/13/2019 EGD colonoscopy and small bowel capsule study during her hospitalization.  EGD was  negative, 04/16/2019 colonoscopy with small polyps removed, 04/18/2019 small bowel capsule study did not reach this small bowel and was inconclusive.  Fatigue: Yes.  SOB: deneis  History of recurrent acute GI bleeding/melena, history of PUD on chronic anticoagulation. 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    INTERVAL HISTORY Sylvia [name] is a 65 y.o. female who has above history reviewed by me today presents for follow up visit for management of anemia Problems and complaints are listed below: Patient reports feeling well.  No black or bloody stool. Patient complaint right hand and wrist swelling.  Some stiffness.  Chronic fatigue at baseline. Review of Systems  Constitutional: Positive for fatigue. Negative for appetite change, chills, fever and unexpected weight change.  HENT:   Negative for hearing loss and voice change.   Eyes: Negative for eye problems.  Respiratory: Negative for chest tightness and cough.   Cardiovascular: Negative for chest pain.  Gastrointestinal: Negative for abdominal distention, abdominal pain and blood in stool.  Endocrine: Negative for hot flashes.  Genitourinary: Negative for difficulty urinating and frequency.   Musculoskeletal: Negative for arthralgias.  Skin: Negative for itching and rash.  Neurological: Negative for extremity weakness.  Hematological: Negative for adenopathy.  Psychiatric/Behavioral: Negative for confusion.    MEDICAL HISTORY:  Past Medical History:  Diagnosis Date  . Anemia of chronic disease 05/16/2019  . Anxiety    h/o  . COPD (chronic obstructive pulmonary disease) ([place])   . Diabetes mellitus without complication ([place])   . GERD (gastroesophageal reflux disease)   . Hypertension    bp under control-off meds since 2019    SURGICAL HISTORY: Past Surgical History:  Procedure Laterality  Date  . ABDOMINAL HYSTERECTOMY    . AMPUTATION Right 12/27/2018   Procedure: AMPUTATION BELOW KNEE;  Surgeon: Algernon Huxley, MD;  Location: [facility];  Service: General;  Laterality: Right;  . CATARACT EXTRACTION W/PHACO Right 03/27/2020   Procedure: CATARACT EXTRACTION PHACO AND INTRAOCULAR LENS PLACEMENT (IOC) RIGHT DIABETIC 7.54 00:52.5;  Surgeon: Birder Robson, MD;  Location: [place];  Service: Ophthalmology;  Laterality: Right;  . CATARACT EXTRACTION W/PHACO Left 05/20/2020   Procedure: CATARACT EXTRACTION PHACO AND INTRAOCULAR LENS PLACEMENT (IOC) LEFT DIABETIC 4.95 00:37.6;  Surgeon: Birder Robson, MD;  Location: [place];  Service: Ophthalmology;  Laterality: Left;  Diabetic - oral meds COVID + 04-24-20  . CHOLECYSTECTOMY    . COLONOSCOPY WITH PROPOFOL N/A 04/16/2019   Procedure: COLONOSCOPY WITH PROPOFOL;  Surgeon: Virgel Manifold, MD;  Location: [facility];  Service: Endoscopy;  Laterality: N/A;  . COLONOSCOPY WITH PROPOFOL N/A 01/16/2020   Procedure: COLONOSCOPY WITH PROPOFOL;  Surgeon: Virgel Manifold, MD;  Location: [facility];  Service: Endoscopy;  Laterality: N/A;  . ESOPHAGOGASTRODUODENOSCOPY (EGD) WITH PROPOFOL N/A 06/15/2018   Procedure: ESOPHAGOGASTRODUODENOSCOPY (EGD) WITH PROPOFOL;  Surgeon: Toledo, Benay Pike, MD;  Location: [facility];  Service: Gastroenterology;  Laterality: N/A;  . ESOPHAGOGASTRODUODENOSCOPY (EGD) WITH PROPOFOL N/A 01/03/2019   Procedure: ESOPHAGOGASTRODUODENOSCOPY (EGD) WITH PROPOFOL;  Surgeon: Lucilla Lame, MD;  Location: [facility];  Service: Endoscopy;  Laterality: N/A;  . GIVENS CAPSULE STUDY  04/16/2019   Procedure: GIVENS CAPSULE STUDY;  Surgeon: Virgel Manifold, MD;  Location: [facility];  Service: Endoscopy;;  . GIVENS CAPSULE STUDY N/A 06/25/2019   Procedure: GIVENS CAPSULE STUDY;  Surgeon: Jonathon Bellows, MD;  Location: [facility];  Service: Gastroenterology;  Laterality: N/A;  . LOWER EXTREMITY ANGIOGRAPHY Right 08/21/2018   Procedure: LOWER EXTREMITY ANGIOGRAPHY;  Surgeon: Algernon Huxley, MD;  Location: [place] CV LAB;  Service: Cardiovascular;  Laterality: Right;  . LOWER EXTREMITY ANGIOGRAPHY Left 08/28/2018   Procedure: LOWER EXTREMITY ANGIOGRAPHY;  Surgeon: Algernon Huxley, MD;  Location: [place] CV [facility];  Service: Cardiovascular;  Laterality: Left;  . LOWER EXTREMITY ANGIOGRAPHY Right 08/28/2018   Procedure: Lower Extremity Angiography;  Surgeon: Algernon Huxley, MD;  Location: [place] CV LAB;  Service: Cardiovascular;  Laterality: Right;  . LOWER EXTREMITY ANGIOGRAPHY Right 12/18/2018   Procedure: Lower Extremity Angiography;  Surgeon: Algernon Huxley, MD;  Location: [place] CV LAB;  Service: Cardiovascular;  Laterality: Right;  . LOWER EXTREMITY ANGIOGRAPHY Right 12/18/2018   Procedure: Lower Extremity Angiography;  Surgeon: Algernon Huxley, MD;  Location: [place] CV LAB;  Service: Cardiovascular;  Laterality: Right;  . LOWER EXTREMITY ANGIOGRAPHY Left 12/21/2018   Procedure: Lower Extremity Angiography;  Surgeon: Algernon Huxley, MD;  Location: [place] CV LAB;  Service: Cardiovascular;  Laterality: Left;  . LOWER EXTREMITY ANGIOGRAPHY Right 12/21/2018   Procedure: Lower Extremity Angiography;  Surgeon: Algernon Huxley, MD;  Location: [place] CV LAB;  Service: Cardiovascular;  Laterality: Right;  . LOWER EXTREMITY INTERVENTION N/A 12/22/2018   Procedure: LOWER EXTREMITY INTERVENTION;  Surgeon: Algernon Huxley, MD;  Location: [place] CV LAB;  Service: Cardiovascular;  Laterality: N/A;    SOCIAL HISTORY: Social History   Socioeconomic History  . Marital status: Divorced    Spouse name: Not on file  . Number of children: Not on file  . Years of education: Not on file  . Highest education level: Not on file  Occupational History  . Not  on file  Tobacco Use  . Smoking status: Current Some Day Smoker    Packs/day: 0.25    Years: 45.00    Pack years: 11.25    Types: Cigarettes  . Smokeless tobacco: Never Used  . Tobacco comment: quit  Vaping Use  . Vaping Use: Never  used  Substance and Sexual Activity  . Alcohol use: No  . Drug use: Not Currently    Comment: last used in April 2021 per patient  . Sexual activity: Not Currently  Other Topics Concern  . Not on file  Social History Narrative  . Not on file   Social Determinants of Health   Financial Resource Strain: Not on file  Food Insecurity: Not on file  Transportation Needs: Not on file  Physical Activity: Not on file  Stress: Not on file  Social Connections: Not on file  Intimate Partner Violence: Not on file    FAMILY HISTORY: Family History  Problem Relation Age of Onset  . Breast cancer Mother 34    ALLERGIES:  is allergic to metformin and related, penicillins, and tramadol.  MEDICATIONS:  Current Outpatient Medications  Medication Sig Dispense Refill  . acetaminophen (TYLENOL) 325 MG tablet Take 650 mg by mouth every 6 (six) hours as needed.    Marland Kitchen albuterol (PROVENTIL HFA;VENTOLIN HFA) 108 (90 Base) MCG/ACT inhaler Inhale 2 puffs into the lungs every 6 (six) hours as needed for wheezing or shortness of breath. 1 Inhaler 2  . apixaban (ELIQUIS) 2.5 MG TABS tablet Take 1 tablet (2.5 mg total) by mouth 2 (two) times daily. 60 tablet 3  . atorvastatin (LIPITOR) 10 MG tablet Take 1 tablet by mouth daily.    . cetirizine (ZYRTEC) 10 MG tablet Take 10 mg by mouth daily.    Marland Kitchen docusate sodium (COLACE) 100 MG capsule Take 100 mg by mouth 2 (two) times daily.    . DULoxetine (CYMBALTA) 30 MG capsule Take 30 mg by mouth daily.    . famotidine (PEPCID) 20 MG tablet Take 1 tablet (20 mg total) by mouth 2 (two) times daily. 60 tablet 1  . ferrous sulfate 325 (65 FE) MG tablet Take 325 mg by mouth daily.     Marland Kitchen gabapentin (NEURONTIN) 300 MG capsule Take 2 Capsules (600 mg) at Bedtime and 1 capsule (300 mg) in the afternoon 90 capsule 2  . JARDIANCE 10 MG TABS tablet Take 10 mg by mouth daily.    Marland Kitchen ketorolac (ACULAR) 0.4 % SOLN INSTILL 1 DROP INTO AFFECTED EYE 4 TIMES DAILY. BEGINNING AFTER  SURGERY.    Marland Kitchen latanoprost (XALATAN) 0.005 % ophthalmic solution 1 drop at bedtime.    Marland Kitchen lisinopril (ZESTRIL) 10 MG tablet Take 1 tablet (10 mg total) by mouth daily. 30 tablet 1  . Melatonin 1 MG TABS Take 1 tablet by mouth at bedtime.    . moxifloxacin (VIGAMOX) 0.5 % ophthalmic solution 1 drop 3 (three) times daily.    . Multiple Vitamins-Minerals (WOMENS MULTIVITAMIN PO) Take 1 tablet by mouth daily.    . Na Sulfate-K Sulfate-Mg Sulf 17.5-3.13-1.6 GM/177ML SOLN At 5 PM the day before procedure take 1 bottle and 5 hours before procedure take 1 bottle. 708 mL 0  . prednisoLONE acetate (PRED FORTE) 1 % ophthalmic suspension INSTILL 1 DROP 4 TIMES DAILY AS DIRECTED. BEGINNING AFTER SURGERY    . sitaGLIPtin (JANUVIA) 100 MG tablet Take 100 mg by mouth daily.    . vitamin B-12 (CYANOCOBALAMIN) 500 MCG tablet Take 1 tablet (500  mcg total) by mouth daily. 90 tablet 1  . rosuvastatin (CRESTOR) 5 MG tablet Take 1 tablet (5 mg total) by mouth daily at 6 PM. (Patient not taking: Reported on 05/30/2020) 30 tablet 1   No current facility-administered medications for this visit.     PHYSICAL EXAMINATION: ECOG PERFORMANCE STATUS: 1 - Symptomatic but completely ambulatory Vitals:   05/30/20 0954 05/30/20 0958  BP: (!) 160/88   Pulse: (!) 118 (!) 108  Temp: 98.1 F (36.7 C)   SpO2: 100%    Filed Weights   05/30/20 0954  Weight: 143 lb 8 oz (65.1 kg)    Physical Exam Constitutional:      General: She is not in acute distress.    Comments: Patient walks with a cane  HENT:     Head: Normocephalic and atraumatic.  Eyes:     General: No scleral icterus. Cardiovascular:     Rate and Rhythm: Normal rate and regular rhythm.     Heart sounds: Normal heart sounds.  Pulmonary:     Effort: Pulmonary effort is normal. No respiratory distress.     Breath sounds: No wheezing.  Abdominal:     General: Bowel sounds are normal. There is no distension.     Palpations: Abdomen is soft.  Musculoskeletal:         General: No deformity. Normal range of motion.     Cervical back: Normal range of motion and neck supple.     Comments: Right lower extremity below-knee amputation,  Skin:    General: Skin is warm and dry.     Findings: No erythema or rash.  Neurological:     Mental Status: She is alert and oriented to person, place, and time. Mental status is at baseline.     Cranial Nerves: No cranial nerve deficit.     Coordination: Coordination normal.  Psychiatric:        Mood and Affect: Mood normal.       CMP Latest Ref Rng & Units 05/28/2020  Glucose 70 - 99 mg/dL 320(H)  BUN 8 - 23 mg/dL 13  Creatinine 0.44 - 1.00 mg/dL 0.84  Sodium 135 - 145 mmol/L 137  Potassium 3.5 - 5.1 mmol/L 3.8  Chloride 98 - 111 mmol/L 106  CO2 22 - 32 mmol/L 19(L)  Calcium 8.9 - 10.3 mg/dL 9.4  Total Protein 6.5 - 8.1 g/dL 7.9  Total Bilirubin 0.3 - 1.2 mg/dL 0.8  Alkaline Phos 38 - 126 U/L 92  AST 15 - 41 U/L 22  ALT 0 - 44 U/L 17   CBC Latest Ref Rng & Units 05/28/2020  WBC 4.0 - 10.5 K/uL 8.8  Hemoglobin 12.0 - 15.0 g/dL 11.1(L)  Hematocrit 36.0 - 46.0 % 31.1(L)  Platelets 150 - 400 K/uL 340     LABORATORY DATA:  I have reviewed the data as listed Lab Results  Component Value Date   WBC 8.8 05/28/2020   HGB 11.1 (L) 05/28/2020   HCT 31.1 (L) 05/28/2020   MCV 82.5 05/28/2020   PLT 340 05/28/2020   Recent Labs    07/25/19 0948 07/26/19 0412 11/23/19 1505 04/24/20 1434 05/28/20 1057  NA 138 141 142 142 137  K 4.1 3.4* 3.6 3.6 3.8  CL 104 112* 109 110 106  CO2 _0 19*  GLUCOSE 219* 274* 166* 138* 320*  BUN _1 CREATININE 0.90 0.66 0.88 0.55 0.84  CALCIUM 9.3 9.0 9.6 9.4 9.4  GFRNONAA >60 >  60 >60 >60 >60  GFRAA >60 >60 >60  --   --   PROT  --   --  7.8 7.8 7.9  ALBUMIN  --   --  4.1 3.9 3.9  AST  --   --  _0 ALT  --   --  _1 ALKPHOS  --   --  103 78 92  BILITOT  --   --  0.7 0.8 0.8   Iron/TIBC/Ferritin/ %Sat    Component Value Date/Time    IRON 78 05/28/2020 1057   TIBC 357 05/28/2020 1057   FERRITIN 90 05/28/2020 1057   IRONPCTSAT 22 05/28/2020 1057     No results found.    ASSESSMENT & PLAN:  1. Anemia of chronic disease   2. History of gastrointestinal bleeding   3. Arthralgia, unspecified joint   4. Hyperglycemia    Anemia of chronic disease. Labs reviewed and discussed with patient.  Hemoglobin stable at 11.1, iron panel stable. No active GI bleeding. . Elevated ESR, patient has also had hand/wrist swelling and stiffness.  Discussed with patient again about rheumatology evaluation and she agrees.  I referred.    Hyperglycemia, glucose level of 320.  She has diabetes.  Continue follow-up with primary for management of uncontrolled diabetes. Follow-up in 6 months  Orders Placed This Encounter  Procedures  . CBC with Differential/Platelet    Standing Status:   Future    Standing Expiration Date:   05/30/2021  . Comprehensive metabolic panel    Standing Status:   Future    Standing Expiration Date:   05/30/2021  . Ferritin    Standing Status:   Future    Standing Expiration Date:   05/30/2021  . Iron and TIBC    Standing Status:   Future    Standing Expiration Date:   05/30/2021  . Ambulatory referral to Rheumatology    Referral Priority:   Routine    Referral Type:   Consultation    Referral Reason:   Specialty Services Required    Requested Specialty:   Rheumatology    Number of Visits Requested:   1    All questions were answered. The patient knows to call the clinic with any problems questions or concerns.    Earlie Server, MD, PhD Hematology Oncology [facility] Pager- [phone] 05/30/2020

## 2020-06-10 ENCOUNTER — Ambulatory Visit (INDEPENDENT_AMBULATORY_CARE_PROVIDER_SITE_OTHER): Payer: Medicare Other

## 2020-06-10 ENCOUNTER — Ambulatory Visit (INDEPENDENT_AMBULATORY_CARE_PROVIDER_SITE_OTHER): Payer: Medicare Other | Admitting: Vascular Surgery

## 2020-06-10 ENCOUNTER — Other Ambulatory Visit: Payer: Self-pay

## 2020-06-10 ENCOUNTER — Encounter (INDEPENDENT_AMBULATORY_CARE_PROVIDER_SITE_OTHER): Payer: Self-pay | Admitting: Vascular Surgery

## 2020-06-10 VITALS — BP 183/80 | HR 108 | Resp 18 | Ht 66.0 in | Wt 142.0 lb

## 2020-06-10 DIAGNOSIS — I70229 Atherosclerosis of native arteries of extremities with rest pain, unspecified extremity: Secondary | ICD-10-CM | POA: Diagnosis not present

## 2020-06-10 DIAGNOSIS — E785 Hyperlipidemia, unspecified: Secondary | ICD-10-CM | POA: Diagnosis not present

## 2020-06-10 DIAGNOSIS — I739 Peripheral vascular disease, unspecified: Secondary | ICD-10-CM | POA: Diagnosis not present

## 2020-06-10 DIAGNOSIS — E119 Type 2 diabetes mellitus without complications: Secondary | ICD-10-CM | POA: Diagnosis not present

## 2020-06-10 DIAGNOSIS — I10 Essential (primary) hypertension: Secondary | ICD-10-CM

## 2020-06-10 DIAGNOSIS — Z89511 Acquired absence of right leg below knee: Secondary | ICD-10-CM

## 2020-06-10 NOTE — Assessment & Plan Note (Signed)
Left ABI is 0.76 but she has fairly strong biphasic waveforms.  Perfusion is reasonably stable.  No current symptoms.  We will go to a once a year follow-up at this point.  Continue current medical regimen.

## 2020-06-10 NOTE — Progress Notes (Signed)
MRN : [id number]  Sylvia [name] is a 65 y.o. (1955/05/29) female who presents with chief complaint of  Chief Complaint  Patient presents with  . Follow-up    ultrasound  .  History of Present Illness: Patient returns today in follow up of her peripheral arterial disease.  She is doing well.  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  This is well-healed and she is using her prosthesis and doing good.  She does ask about a handicap placard for her car which would be very reasonable given her amputation and the ambulatory dysfunction that comes even with the prosthesis.  No current left leg symptoms. Left ABI is 0.76 but she has fairly strong biphasic waveforms.  Current Outpatient Medications  Medication Sig Dispense Refill  . acetaminophen (TYLENOL) 325 MG tablet Take 650 mg by mouth every 6 (six) hours as needed.    Marland Kitchen albuterol (PROVENTIL HFA;VENTOLIN HFA) 108 (90 Base) MCG/ACT inhaler Inhale 2 puffs into the lungs every 6 (six) hours as needed for wheezing or shortness of breath. 1 Inhaler 2  . apixaban (ELIQUIS) 2.5 MG TABS tablet Take 1 tablet (2.5 mg total) by mouth 2 (two) times daily. 60 tablet 3  . atorvastatin (LIPITOR) 10 MG tablet Take 1 tablet by mouth daily.    . cetirizine (ZYRTEC) 10 MG tablet Take 10 mg by mouth daily.    Marland Kitchen docusate sodium (COLACE) 100 MG capsule Take 100 mg by mouth 2 (two) times daily.    . DULoxetine (CYMBALTA) 30 MG capsule Take 30 mg by mouth daily.    . ferrous sulfate 325 (65 FE) MG tablet Take 325 mg by mouth daily.     Marland Kitchen gabapentin (NEURONTIN) 300 MG capsule Take 2 Capsules (600 mg) at Bedtime and 1 capsule (300 mg) in the afternoon 90 capsule 2  . JARDIANCE 10 MG TABS tablet Take 10 mg by mouth daily.    Marland Kitchen ketorolac (ACULAR) 0.4 % SOLN INSTILL 1 DROP INTO AFFECTED EYE 4 TIMES DAILY. BEGINNING AFTER SURGERY.    Marland Kitchen latanoprost (XALATAN) 0.005 % ophthalmic solution 1 drop at bedtime.    Marland Kitchen lisinopril (ZESTRIL) 10 MG  tablet Take 1 tablet (10 mg total) by mouth daily. 30 tablet 1  . Melatonin 1 MG TABS Take 1 tablet by mouth at bedtime.    . moxifloxacin (VIGAMOX) 0.5 % ophthalmic solution 1 drop 3 (three) times daily.    . Multiple Vitamins-Minerals (WOMENS MULTIVITAMIN PO) Take 1 tablet by mouth daily.    . prednisoLONE acetate (PRED FORTE) 1 % ophthalmic suspension INSTILL 1 DROP 4 TIMES DAILY AS DIRECTED. BEGINNING AFTER SURGERY    . rosuvastatin (CRESTOR) 5 MG tablet Take 1 tablet (5 mg total) by mouth daily at 6 PM. 30 tablet 1  . sitaGLIPtin (JANUVIA) 100 MG tablet Take 100 mg by mouth daily.    . vitamin B-12 (CYANOCOBALAMIN) 500 MCG tablet Take 1 tablet (500 mcg total) by mouth daily. 90 tablet 1  . famotidine (PEPCID) 20 MG tablet Take 1 tablet (20 mg total) by mouth 2 (two) times daily. 60 tablet 1  . Na Sulfate-K Sulfate-Mg Sulf 17.5-3.13-1.6 GM/177ML SOLN At 5 PM the day before procedure take 1 bottle and 5 hours before procedure take 1 bottle. (Patient not taking: Reported on 06/10/2020) 708 mL 0   No current facility-administered medications for this visit.    Past Medical History:  Diagnosis Date  . Anemia of chronic disease 05/16/2019  .  Anxiety    h/o  . COPD (chronic obstructive pulmonary disease) (HCC)   . Diabetes mellitus without complication (HCC)   . GERD (gastroesophageal reflux disease)   . Hypertension    bp under control-off meds since 2019    Past Surgical History:  Procedure Laterality Date  . ABDOMINAL HYSTERECTOMY    . AMPUTATION Right 12/27/2018   Procedure: AMPUTATION BELOW KNEE;  Surgeon: Annice Needy, MD;  Location: [facility];  Service: General;  Laterality: Right;  . CATARACT EXTRACTION W/PHACO Right 03/27/2020   Procedure: CATARACT EXTRACTION PHACO AND INTRAOCULAR LENS PLACEMENT (IOC) RIGHT DIABETIC 7.54 00:52.5;  Surgeon: Galen Manila, MD;  Location: [facility];  Service: Ophthalmology;  Laterality: Right;  . CATARACT EXTRACTION W/PHACO Left 05/20/2020    Procedure: CATARACT EXTRACTION PHACO AND INTRAOCULAR LENS PLACEMENT (IOC) LEFT DIABETIC 4.95 00:37.6;  Surgeon: Galen Manila, MD;  Location: [facility];  Service: Ophthalmology;  Laterality: Left;  Diabetic - oral meds COVID + 04-24-20  . CHOLECYSTECTOMY    . COLONOSCOPY WITH PROPOFOL N/A 04/16/2019   Procedure: COLONOSCOPY WITH PROPOFOL;  Surgeon: Pasty Spillers, MD;  Location: [facility];  Service: Endoscopy;  Laterality: N/A;  . COLONOSCOPY WITH PROPOFOL N/A 01/16/2020   Procedure: COLONOSCOPY WITH PROPOFOL;  Surgeon: Pasty Spillers, MD;  Location: [facility];  Service: Endoscopy;  Laterality: N/A;  . ESOPHAGOGASTRODUODENOSCOPY (EGD) WITH PROPOFOL N/A 06/15/2018   Procedure: ESOPHAGOGASTRODUODENOSCOPY (EGD) WITH PROPOFOL;  Surgeon: Toledo, Boykin Nearing, MD;  Location: [facility];  Service: Gastroenterology;  Laterality: N/A;  . ESOPHAGOGASTRODUODENOSCOPY (EGD) WITH PROPOFOL N/A 01/03/2019   Procedure: ESOPHAGOGASTRODUODENOSCOPY (EGD) WITH PROPOFOL;  Surgeon: Midge Minium, MD;  Location: [facility];  Service: Endoscopy;  Laterality: N/A;  . GIVENS CAPSULE STUDY  04/16/2019   Procedure: GIVENS CAPSULE STUDY;  Surgeon: Pasty Spillers, MD;  Location: [facility];  Service: Endoscopy;;  . GIVENS CAPSULE STUDY N/A 06/25/2019   Procedure: GIVENS CAPSULE STUDY;  Surgeon: Wyline Mood, MD;  Location: [facility];  Service: Gastroenterology;  Laterality: N/A;  . LOWER EXTREMITY ANGIOGRAPHY Right 08/21/2018   Procedure: LOWER EXTREMITY ANGIOGRAPHY;  Surgeon: Annice Needy, MD;  Location: [facility];  Service: Cardiovascular;  Laterality: Right;  . LOWER EXTREMITY ANGIOGRAPHY Left 08/28/2018   Procedure: LOWER EXTREMITY ANGIOGRAPHY;  Surgeon: Annice Needy, MD;  Location: [facility];  Service: Cardiovascular;  Laterality: Left;  . LOWER EXTREMITY ANGIOGRAPHY Right 08/28/2018   Procedure: Lower Extremity Angiography;  Surgeon: Annice Needy, MD;   Location: [facility];  Service: Cardiovascular;  Laterality: Right;  . LOWER EXTREMITY ANGIOGRAPHY Right 12/18/2018   Procedure: Lower Extremity Angiography;  Surgeon: Annice Needy, MD;  Location: [facility];  Service: Cardiovascular;  Laterality: Right;  . LOWER EXTREMITY ANGIOGRAPHY Right 12/18/2018   Procedure: Lower Extremity Angiography;  Surgeon: Annice Needy, MD;  Location: [facility];  Service: Cardiovascular;  Laterality: Right;  . LOWER EXTREMITY ANGIOGRAPHY Left 12/21/2018   Procedure: Lower Extremity Angiography;  Surgeon: Annice Needy, MD;  Location: [facility];  Service: Cardiovascular;  Laterality: Left;  . LOWER EXTREMITY ANGIOGRAPHY Right 12/21/2018   Procedure: Lower Extremity Angiography;  Surgeon: Annice Needy, MD;  Location: [facility];  Service: Cardiovascular;  Laterality: Right;  . LOWER EXTREMITY INTERVENTION N/A 12/22/2018   Procedure: LOWER EXTREMITY INTERVENTION;  Surgeon: Annice Needy, MD;  Location: [facility];  Service: Cardiovascular;  Laterality: N/A;  Social History   Tobacco Use  . Smoking status: Current Some Day Smoker    Packs/day: 0.25    Years: 45.00    Pack years: 11.25    Types: Cigarettes  . Smokeless tobacco: Never Used  . Tobacco comment: quit  Vaping Use  . Vaping Use: Never used  Substance Use Topics  . Alcohol use: No  . Drug use: Not Currently    Comment: last used in April 2021 per patient       Family History  Problem Relation Age of Onset  . Breast cancer Mother 49     Allergies  Allergen Reactions  . Metformin And Related Diarrhea  . Penicillins Hives    Has patient had a PCN reaction causing immediate rash, facial/tongue/throat swelling, SOB or lightheadedness with hypotension: Yes Has patient had a PCN reaction causing severe rash involving mucus membranes or skin necrosis: No Has patient had a PCN reaction that required hospitalization: No Has patient had a PCN  reaction occurring within the last 10 years: No If all of the above answers are "NO", then may proceed with Cephalosporin use.  . Tramadol Itching     REVIEW OF SYSTEMS (Negative unless checked)  Constitutional: [] Weight loss  [] Fever  [] Chills Cardiac: [] Chest pain   [] Chest pressure   [] Palpitations   [] Shortness of breath when laying flat   [] Shortness of breath at rest   [] Shortness of breath with exertion. Vascular:  [] Pain in legs with walking   [] Pain in legs at rest   [] Pain in legs when laying flat   [] Claudication   [] Pain in feet when walking  [] Pain in feet at rest  [] Pain in feet when laying flat   [] History of DVT   [] Phlebitis   [] Swelling in legs   [] Varicose veins   [] Non-healing ulcers Pulmonary:   [] Uses home oxygen   [] Productive cough   [] Hemoptysis   [] Wheeze  [] COPD   [] Asthma Neurologic:  [] Dizziness  [] Blackouts   [] Seizures   [] History of stroke   [] History of TIA  [] Aphasia   [] Temporary blindness   [] Dysphagia   [] Weakness or numbness in arms   [] Weakness or numbness in legs Musculoskeletal:  [] Arthritis   [] Joint swelling   [] Joint pain   [] Low back pain Hematologic:  [] Easy bruising  [] Easy bleeding   [] Hypercoagulable state   [] Anemic   Gastrointestinal:  [] Blood in stool   [] Vomiting blood  [x] Gastroesophageal reflux/heartburn   [] Abdominal pain Genitourinary:  [x] Chronic kidney disease   [] Difficult urination  [] Frequent urination  [] Burning with urination   [] Hematuria Skin:  [] Rashes   [] Ulcers   [] Wounds Psychological:  [] History of anxiety   []  History of major depression.  Physical Examination  BP (!) 183/80 (BP Location: Right Arm)   Pulse (!) 108   Resp 18   Ht 5\' 6"  (1.676 m)   Wt 142 lb (64.4 kg)   BMI 22.92 kg/m  Gen:  WD/WN, NAD Head: [place]/AT, No temporalis wasting. Ear/Nose/Throat: Hearing grossly intact, nares w/o erythema or drainage Eyes: Conjunctiva clear. Sclera non-icteric Neck: Supple.  Trachea midline Pulmonary:  Good air movement,  no use of accessory muscles.  Cardiac: tachycardic Vascular:  Vessel Right Left  Radial Palpable Palpable                          PT Not Palpable 1+ Palpable  DP Not Palpable 2+ Palpable   Gastrointestinal: soft, non-tender/non-distended. No guarding/reflex.  Musculoskeletal: M/S  5/5 throughout.  Right BKA well healed, has prosthesis. NO left leg edema. Neurologic: Sensation grossly intact in extremities.  Symmetrical.  Speech is fluent.  Psychiatric: Judgment intact, Mood & affect appropriate for pt's clinical situation. Dermatologic: No rashes or ulcers noted.  No cellulitis or open wounds.       Labs Recent Results (from the past 2160 hour(s))  SARS CORONAVIRUS 2 (TAT 6-24 HRS) Nasopharyngeal Nasopharyngeal Swab     Status: None   Collection Time: 03/25/20 12:45 PM   Specimen: Nasopharyngeal Swab  Result Value Ref Range   SARS Coronavirus 2 NEGATIVE NEGATIVE    Comment: (NOTE) SARS-CoV-2 target nucleic acids are NOT DETECTED.  The SARS-CoV-2 RNA is generally detectable in upper and lower respiratory specimens during the acute phase of infection. Negative results do not preclude SARS-CoV-2 infection, do not rule out co-infections with other pathogens, and should not be used as the sole basis for treatment or other patient management decisions. Negative results must be combined with clinical observations, patient history, and epidemiological information. The expected result is Negative.  Fact Sheet for Patients: HairSlick.no  Fact Sheet for Healthcare Providers: quierodirigir.com  This test is not yet approved or cleared by the [place] FDA and  has been authorized for detection and/or diagnosis of SARS-CoV-2 by FDA under an Emergency Use Authorization (EUA). This EUA will remain  in effect (meaning this test can be used) for the duration of the COVID-19 declaration under Se ction 564(b)(1) of the Act,  21 U.S.C. section 360bbb-3(b)(1), unless the authorization is terminated or revoked sooner.  Performed at [facility], [address], [place], [place] [address]   Glucose, capillary     Status: Abnormal   Collection Time: 03/27/20  7:08 AM  Result Value Ref Range   Glucose-Capillary 209 (H) 70 - 99 mg/dL    Comment: Glucose reference range applies only to samples taken after fasting for at least 8 hours.  Glucose, capillary     Status: Abnormal   Collection Time: 03/27/20  8:39 AM  Result Value Ref Range   Glucose-Capillary 212 (H) 70 - 99 mg/dL    Comment: Glucose reference range applies only to samples taken after fasting for at least 8 hours.  Lipase, blood     Status: None   Collection Time: 04/24/20  2:34 PM  Result Value Ref Range   Lipase 33 11 - 51 U/L    Comment: Performed at [facility], [address] [place], [place] [address]  Comprehensive metabolic panel     Status: Abnormal   Collection Time: 04/24/20  2:34 PM  Result Value Ref Range   Sodium 142 135 - 145 mmol/L   Potassium 3.6 3.5 - 5.1 mmol/L   Chloride 110 98 - 111 mmol/L   CO2 22 22 - 32 mmol/L   Glucose, Bld 138 (H) 70 - 99 mg/dL    Comment: Glucose reference range applies only to samples taken after fasting for at least 8 hours.   BUN 8 8 - 23 mg/dL   Creatinine, Ser 6.83 0.44 - 1.00 mg/dL   Calcium 9.4 8.9 - 41.9 mg/dL   Total Protein 7.8 6.5 - 8.1 g/dL   Albumin 3.9 3.5 - 5.0 g/dL   AST 18 15 - 41 U/L   ALT 13 0 - 44 U/L   Alkaline Phosphatase 78 38 - 126 U/L   Total Bilirubin 0.8 0.3 - 1.2 mg/dL   GFR, Estimated >62 >22 mL/min  Comment: (NOTE) Calculated using the CKD-EPI Creatinine Equation (2021)    Anion gap 10 5 - 15    Comment: Performed at [facility], [address] [place], [place] [address]  CBC     Status: Abnormal   Collection Time: 04/24/20  2:34 PM  Result Value Ref Range   WBC 4.3 4.0 - 10.5 K/uL   RBC 4.06 3.87 - 5.11 MIL/uL   Hemoglobin 11.8  (L) 12.0 - 15.0 g/dL   HCT 60.4 (L) 54.0 - 98.1 %   MCV 85.7 80.0 - 100.0 fL   MCH 29.1 26.0 - 34.0 pg   MCHC 33.9 30.0 - 36.0 g/dL   RDW 19.1 47.8 - 29.5 %   Platelets 218 150 - 400 K/uL   nRBC 0.0 0.0 - 0.2 %    Comment: Performed at [facility], [address] [place], [place] [address]  Resp Panel by RT-PCR (Flu A&B, Covid) Nasopharyngeal Swab     Status: Abnormal   Collection Time: 04/24/20  3:28 PM   Specimen: Nasopharyngeal Swab; Nasopharyngeal(NP) swabs in vial transport medium  Result Value Ref Range   SARS Coronavirus 2 by RT PCR POSITIVE (A) NEGATIVE    Comment: RESULT CALLED TO, READ BACK BY AND VERIFIED WITH: KATIE FERGURSON  ON 04/24/20 MJU (NOTE) SARS-CoV-2 target nucleic acids are DETECTED.  The SARS-CoV-2 RNA is generally detectable in upper respiratory specimens during the acute phase of infection. Positive results are indicative of the presence of the identified virus, but do not rule out bacterial infection or co-infection with other pathogens not detected by the test. Clinical correlation with patient history and other diagnostic information is necessary to determine patient infection status. The expected result is Negative.  Fact Sheet for Patients: BloggerCourse.com  Fact Sheet for Healthcare Providers: SeriousBroker.it  This test is not yet approved or cleared by the [place] FDA and  has been authorized for detection and/or diagnosis of SARS-CoV-2 by FDA under an Emergency Use Authorization (EUA).  This EUA will remain in effect (meaning this test can  be used) for the duration of  the COVID-19 declaration under Section 564(b)(1) of the Act, 21 U.S.C. section 360bbb-3(b)(1), unless the authorization is terminated or revoked sooner.     Influenza A by PCR NEGATIVE NEGATIVE   Influenza B by PCR NEGATIVE NEGATIVE    Comment: (NOTE) The Xpert Xpress SARS-CoV-2/FLU/RSV plus assay is  intended as an aid in the diagnosis of influenza from Nasopharyngeal swab specimens and should not be used as a sole basis for treatment. Nasal washings and aspirates are unacceptable for Xpert Xpress SARS-CoV-2/FLU/RSV testing.  Fact Sheet for Patients: BloggerCourse.com  Fact Sheet for Healthcare Providers: SeriousBroker.it  This test is not yet approved or cleared by the [place] FDA and has been authorized for detection and/or diagnosis of SARS-CoV-2 by FDA under an Emergency Use Authorization (EUA). This EUA will remain in effect (meaning this test can be used) for the duration of the COVID-19 declaration under Section 564(b)(1) of the Act, 21 U.S.C. section 360bbb-3(b)(1), unless the authorization is terminated or revoked.  Performed at [facility], [address] [place], [place] [address]   Glucose, capillary     Status: Abnormal   Collection Time: 05/20/20  6:49 AM  Result Value Ref Range   Glucose-Capillary 214 (H) 70 - 99 mg/dL    Comment: Glucose reference range applies only to samples taken after fasting for at least 8 hours.  Glucose, capillary  Status: Abnormal   Collection Time: 05/20/20  7:46 AM  Result Value Ref Range   Glucose-Capillary 257 (H) 70 - 99 mg/dL    Comment: Glucose reference range applies only to samples taken after fasting for at least 8 hours.  Vitamin B12     Status: None   Collection Time: 05/28/20 10:57 AM  Result Value Ref Range   Vitamin B-12 521 180 - 914 pg/mL    Comment: (NOTE) This assay is not validated for testing neonatal or myeloproliferative syndrome specimens for Vitamin B12 levels. Performed at [facility], [address] [place], [place] [address]   Iron and TIBC     Status: None   Collection Time: 05/28/20 10:57 AM  Result Value Ref Range   Iron 78 28 - 170 ug/dL   TIBC 979 892 - 119 ug/dL   Saturation Ratios 22 10.4 - 31.8 %   UIBC 279  ug/dL    Comment: Performed at [facility], [address] [place], [place] [address]  Comprehensive metabolic panel     Status: Abnormal   Collection Time: 05/28/20 10:57 AM  Result Value Ref Range   Sodium 137 135 - 145 mmol/L   Potassium 3.8 3.5 - 5.1 mmol/L   Chloride 106 98 - 111 mmol/L   CO2 19 (L) 22 - 32 mmol/L   Glucose, Bld 320 (H) 70 - 99 mg/dL    Comment: Glucose reference range applies only to samples taken after fasting for at least 8 hours.   BUN 13 8 - 23 mg/dL   Creatinine, Ser 8.14 0.44 - 1.00 mg/dL   Calcium 9.4 8.9 - 48.1 mg/dL   Total Protein 7.9 6.5 - 8.1 g/dL   Albumin 3.9 3.5 - 5.0 g/dL   AST 22 15 - 41 U/L   ALT 17 0 - 44 U/L   Alkaline Phosphatase 92 38 - 126 U/L   Total Bilirubin 0.8 0.3 - 1.2 mg/dL   GFR, Estimated >85 >63 mL/min    Comment: (NOTE) Calculated using the CKD-EPI Creatinine Equation (2021)    Anion gap 12 5 - 15    Comment: Performed at [facility], [address] [place], [place] [address]  Ferritin     Status: None   Collection Time: 05/28/20 10:57 AM  Result Value Ref Range   Ferritin 90 11 - 307 ng/mL    Comment: Performed at [facility], [address] [place], [place] [address]  CBC with Differential/Platelet     Status: Abnormal   Collection Time: 05/28/20 10:57 AM  Result Value Ref Range   WBC 8.8 4.0 - 10.5 K/uL   RBC 3.77 (L) 3.87 - 5.11 MIL/uL   Hemoglobin 11.1 (L) 12.0 - 15.0 g/dL   HCT 58.8 (L) 50.2 - 77.4 %   MCV 82.5 80.0 - 100.0 fL   MCH 29.4 26.0 - 34.0 pg   MCHC 35.7 30.0 - 36.0 g/dL   RDW 12.8 78.6 - 76.7 %   Platelets 340 150 - 400 K/uL   nRBC 0.0 0.0 - 0.2 %   Neutrophils Relative % 62 %   Neutro Abs 5.5 1.7 - 7.7 K/uL   Lymphocytes Relative 26 %   Lymphs Abs 2.3 0.7 - 4.0 K/uL   Monocytes Relative 7 %   Monocytes Absolute 0.7 0.1 - 1.0 K/uL   Eosinophils Relative 3 %   Eosinophils Absolute 0.3 0.0 - 0.5 K/uL   Basophils Relative 1 %   Basophils  Absolute 0.1 0.0 - 0.1  K/uL   Immature Granulocytes 1 %   Abs Immature Granulocytes 0.05 0.00 - 0.07 K/uL    Comment: Performed at [facility], [address] [place], [place] [address]    Radiology No results found.  Assessment/Plan HTN (hypertension) blood pressure control important in reducing the progression of atherosclerotic disease. On appropriate oral medications.   Hx of right BKA (HCC) Over a year ago, healed.  Doing great with her prosthesis.  HLD (hyperlipidemia) lipid control important in reducing the progression of atherosclerotic disease. Continue statin therapy   Non-insulin dependent type 2 diabetes mellitus (HCC) blood glucose control important in reducing the progression of atherosclerotic disease. Also, involved in wound healing. On appropriate medications.  PAD (peripheral artery disease) (HCC) Left ABI is 0.76 but she has fairly strong biphasic waveforms.  Perfusion is reasonably stable.  No current symptoms.  We will go to a once a year follow-up at this point.  Continue current medical regimen.    [name], MD  06/10/2020 11:08 AM    This note was created with Dragon medical transcription system.  Any errors from dictation are purely unintentional

## 2020-06-13 ENCOUNTER — Encounter (INDEPENDENT_AMBULATORY_CARE_PROVIDER_SITE_OTHER): Payer: Self-pay

## 2020-07-11 DIAGNOSIS — Z0289 Encounter for other administrative examinations: Secondary | ICD-10-CM

## 2020-08-13 DIAGNOSIS — M0609 Rheumatoid arthritis without rheumatoid factor, multiple sites: Secondary | ICD-10-CM | POA: Insufficient documentation

## 2020-09-20 IMAGING — DX DG CHEST 1V PORT
1 series · 1 of 1 positions shown · non-contrast
Comparison: Chest radiograph dated 08/20/2017

CLINICAL DATA: Syncope

EXAM:
PORTABLE CHEST 1 VIEW

[chest ap]
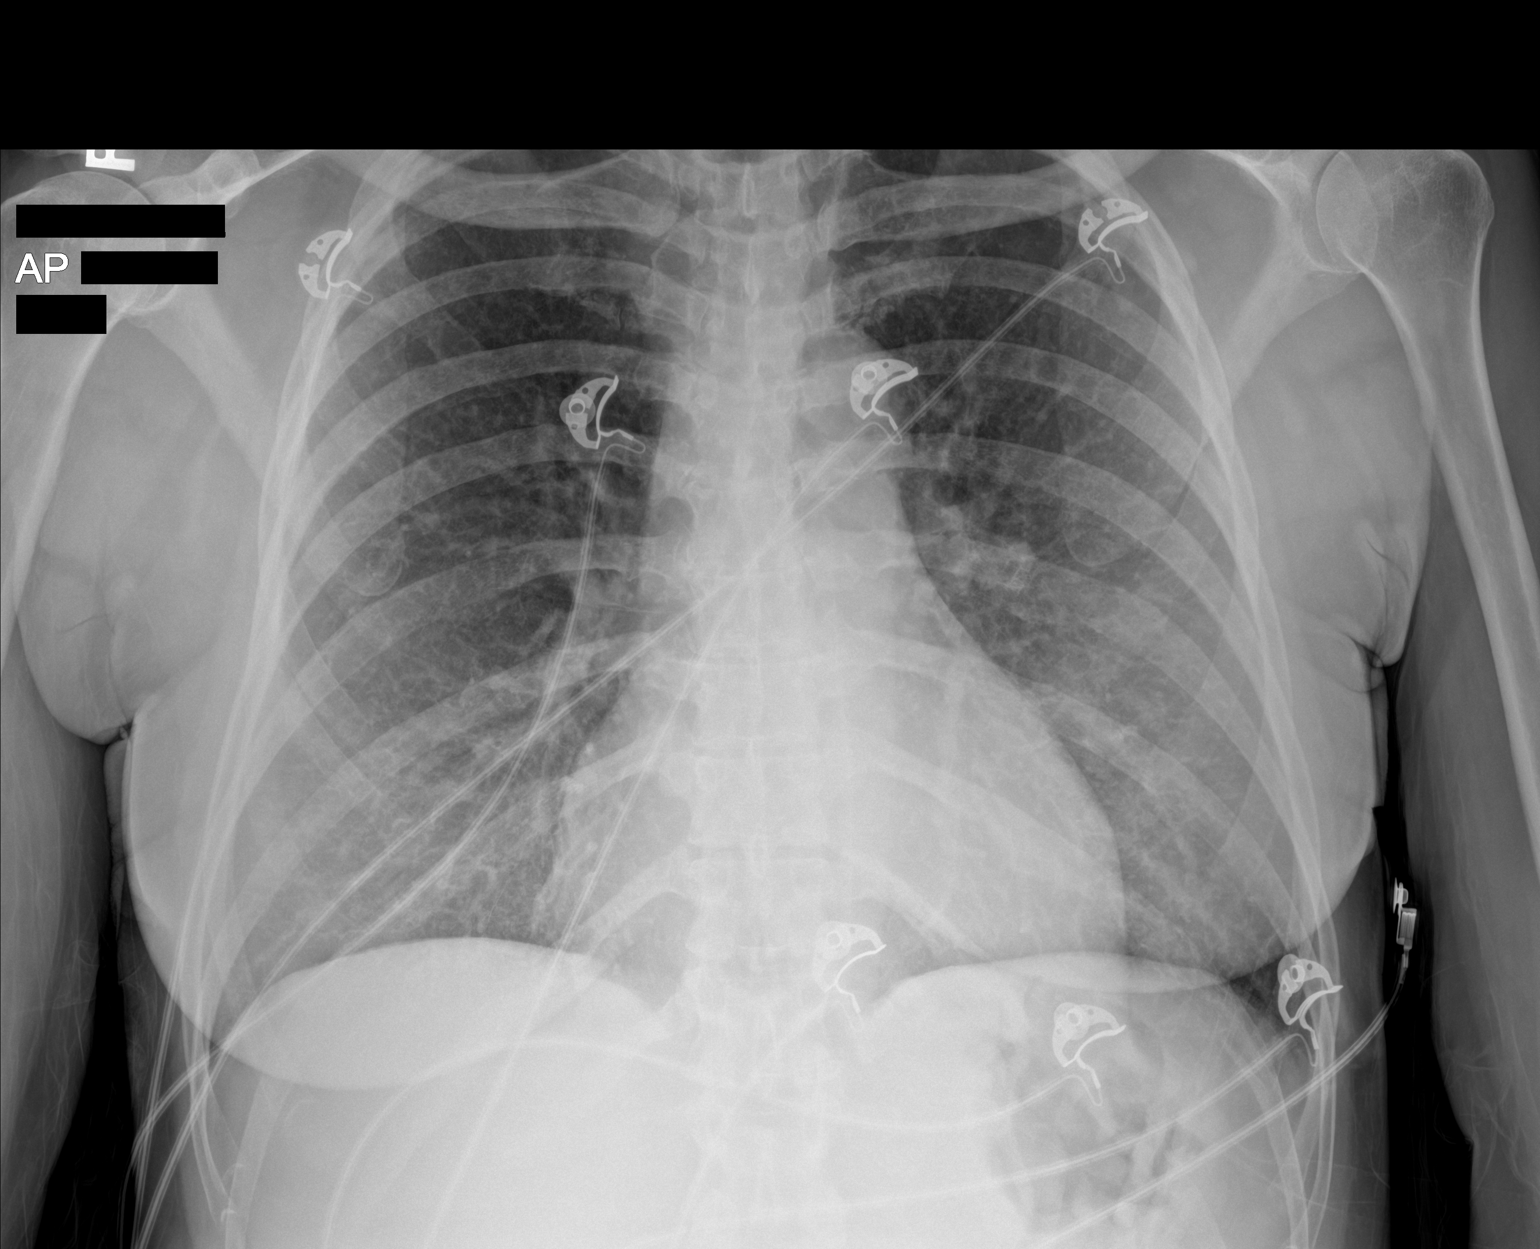

[1 of 1 positions shown; findings below may reference images not displayed]

FINDINGS: The heart size and mediastinal contours are within normal limits.
Chronic peribronchial cuffing is not significantly changed. No focal
consolidation, pleural effusion or pneumothorax. The visualized
skeletal structures are unremarkable.
IMPRESSION: Findings suggestive of chronic bronchitis.

## 2020-11-26 ENCOUNTER — Inpatient Hospital Stay: Payer: Medicare Other | Attending: Oncology

## 2020-11-26 ENCOUNTER — Telehealth (INDEPENDENT_AMBULATORY_CARE_PROVIDER_SITE_OTHER): Payer: Self-pay | Admitting: Vascular Surgery

## 2020-11-26 DIAGNOSIS — Z7984 Long term (current) use of oral hypoglycemic drugs: Secondary | ICD-10-CM | POA: Diagnosis not present

## 2020-11-26 DIAGNOSIS — N189 Chronic kidney disease, unspecified: Secondary | ICD-10-CM | POA: Insufficient documentation

## 2020-11-26 DIAGNOSIS — M069 Rheumatoid arthritis, unspecified: Secondary | ICD-10-CM | POA: Diagnosis not present

## 2020-11-26 DIAGNOSIS — Z8616 Personal history of COVID-19: Secondary | ICD-10-CM | POA: Insufficient documentation

## 2020-11-26 DIAGNOSIS — M2550 Pain in unspecified joint: Secondary | ICD-10-CM

## 2020-11-26 DIAGNOSIS — E1165 Type 2 diabetes mellitus with hyperglycemia: Secondary | ICD-10-CM | POA: Insufficient documentation

## 2020-11-26 DIAGNOSIS — Z79899 Other long term (current) drug therapy: Secondary | ICD-10-CM | POA: Diagnosis not present

## 2020-11-26 DIAGNOSIS — I129 Hypertensive chronic kidney disease with stage 1 through stage 4 chronic kidney disease, or unspecified chronic kidney disease: Secondary | ICD-10-CM | POA: Insufficient documentation

## 2020-11-26 DIAGNOSIS — Z7901 Long term (current) use of anticoagulants: Secondary | ICD-10-CM | POA: Insufficient documentation

## 2020-11-26 DIAGNOSIS — D631 Anemia in chronic kidney disease: Secondary | ICD-10-CM | POA: Diagnosis present

## 2020-11-26 LAB — CBC WITH DIFFERENTIAL/PLATELET
Abs Immature Granulocytes: 0.01 10*3/uL (ref 0.00–0.07)
Basophils Absolute: 0.1 10*3/uL (ref 0.0–0.1)
Basophils Relative: 1 %
Eosinophils Absolute: 0.1 10*3/uL (ref 0.0–0.5)
Eosinophils Relative: 1 %
HCT: 32 % — ABNORMAL LOW (ref 36.0–46.0)
Hemoglobin: 11.1 g/dL — ABNORMAL LOW (ref 12.0–15.0)
Immature Granulocytes: 0 %
Lymphocytes Relative: 27 %
Lymphs Abs: 2.3 10*3/uL (ref 0.7–4.0)
MCH: 30.3 pg (ref 26.0–34.0)
MCHC: 34.7 g/dL (ref 30.0–36.0)
MCV: 87.4 fL (ref 80.0–100.0)
Monocytes Absolute: 0.5 10*3/uL (ref 0.1–1.0)
Monocytes Relative: 6 %
Neutro Abs: 5.6 10*3/uL (ref 1.7–7.7)
Neutrophils Relative %: 65 %
Platelets: 332 10*3/uL (ref 150–400)
RBC: 3.66 MIL/uL — ABNORMAL LOW (ref 3.87–5.11)
RDW: 13.3 % (ref 11.5–15.5)
WBC: 8.6 10*3/uL (ref 4.0–10.5)
nRBC: 0 % (ref 0.0–0.2)

## 2020-11-26 LAB — COMPREHENSIVE METABOLIC PANEL
ALT: 14 U/L (ref 0–44)
AST: 16 U/L (ref 15–41)
Albumin: 4.2 g/dL (ref 3.5–5.0)
Alkaline Phosphatase: 83 U/L (ref 38–126)
Anion gap: 9 (ref 5–15)
BUN: 15 mg/dL (ref 8–23)
CO2: 21 mmol/L — ABNORMAL LOW (ref 22–32)
Calcium: 9.7 mg/dL (ref 8.9–10.3)
Chloride: 105 mmol/L (ref 98–111)
Creatinine, Ser: 1.1 mg/dL — ABNORMAL HIGH (ref 0.44–1.00)
GFR, Estimated: 56 mL/min — ABNORMAL LOW (ref >60)
Glucose, Bld: 208 mg/dL — ABNORMAL HIGH (ref 70–99)
Potassium: 3.7 mmol/L (ref 3.5–5.1)
Sodium: 135 mmol/L (ref 135–145)
Total Bilirubin: 0.9 mg/dL (ref 0.3–1.2)
Total Protein: 7.8 g/dL (ref 6.5–8.1)

## 2020-11-26 LAB — IRON AND TIBC
Iron: 84 ug/dL (ref 28–170)
Saturation Ratios: 23 % (ref 10.4–31.8)
TIBC: 365 ug/dL (ref 250–450)
UIBC: 281 ug/dL

## 2020-11-26 LAB — FERRITIN: Ferritin: 108 ng/mL (ref 11–307)

## 2020-11-26 NOTE — Telephone Encounter (Signed)
Patient walked into office requesting an appointment with Dr. Wyn Quaker.  Patient states they went to [facility] to get fitted for a prothesis but the socket is to big.  [employer] told patient she needs to have an appointment first in order to be refitted. Patient also also brought in a handicap placard form for the provider to fill out. Please advise

## 2020-11-26 NOTE — Telephone Encounter (Signed)
Patient has been scheduled for 8/12 at 11:30 am

## 2020-11-26 NOTE — Telephone Encounter (Signed)
Patient can be schedule for follow up see Dew or Vivia Birmingham

## 2020-11-28 ENCOUNTER — Encounter (INDEPENDENT_AMBULATORY_CARE_PROVIDER_SITE_OTHER): Payer: Self-pay | Admitting: Vascular Surgery

## 2020-11-28 ENCOUNTER — Inpatient Hospital Stay (HOSPITAL_BASED_OUTPATIENT_CLINIC_OR_DEPARTMENT_OTHER): Payer: Medicare Other | Admitting: Oncology

## 2020-11-28 ENCOUNTER — Other Ambulatory Visit: Payer: Self-pay

## 2020-11-28 ENCOUNTER — Encounter: Payer: Self-pay | Admitting: Oncology

## 2020-11-28 ENCOUNTER — Ambulatory Visit (INDEPENDENT_AMBULATORY_CARE_PROVIDER_SITE_OTHER): Payer: Medicare Other | Admitting: Vascular Surgery

## 2020-11-28 ENCOUNTER — Other Ambulatory Visit (INDEPENDENT_AMBULATORY_CARE_PROVIDER_SITE_OTHER): Payer: Self-pay

## 2020-11-28 VITALS — BP 112/71 | HR 99 | Temp 99.1°F | Resp 18 | Wt 141.6 lb

## 2020-11-28 VITALS — BP 158/77 | HR 106 | Resp 16 | Wt 143.0 lb

## 2020-11-28 DIAGNOSIS — R739 Hyperglycemia, unspecified: Secondary | ICD-10-CM

## 2020-11-28 DIAGNOSIS — I70229 Atherosclerosis of native arteries of extremities with rest pain, unspecified extremity: Secondary | ICD-10-CM

## 2020-11-28 DIAGNOSIS — E119 Type 2 diabetes mellitus without complications: Secondary | ICD-10-CM | POA: Diagnosis not present

## 2020-11-28 DIAGNOSIS — E785 Hyperlipidemia, unspecified: Secondary | ICD-10-CM

## 2020-11-28 DIAGNOSIS — N189 Chronic kidney disease, unspecified: Secondary | ICD-10-CM | POA: Diagnosis not present

## 2020-11-28 DIAGNOSIS — I739 Peripheral vascular disease, unspecified: Secondary | ICD-10-CM

## 2020-11-28 DIAGNOSIS — Z89511 Acquired absence of right leg below knee: Secondary | ICD-10-CM

## 2020-11-28 DIAGNOSIS — I10 Essential (primary) hypertension: Secondary | ICD-10-CM

## 2020-11-28 DIAGNOSIS — D638 Anemia in other chronic diseases classified elsewhere: Secondary | ICD-10-CM | POA: Diagnosis not present

## 2020-11-28 MED ORDER — APIXABAN 2.5 MG PO TABS: 2.5000 mg | ORAL_TABLET | Freq: Two times a day (BID) | ORAL | 4 refills | Status: DC

## 2020-11-28 NOTE — Progress Notes (Signed)
MRN : [id number]  Sylvia [name] is a 65 y.o. (March 25, 1956) female who presents with chief complaint of  Chief Complaint  Patient presents with   Follow-up    Follow up for resizing prosthetic  .  History of Present Illness: Patient returns today in follow up of her prosthesis and previous BKA. She has lost weight and her BKA prosthesis is no longer fitting well.  She needs four socks on it to make it usable. Her left foot is hurting more lately as well.  No new ulceration or infection on the left.    Current Outpatient Medications  Medication Sig Dispense Refill   acetaminophen (TYLENOL) 325 MG tablet Take 650 mg by mouth every 6 (six) hours as needed.     albuterol (PROVENTIL HFA;VENTOLIN HFA) 108 (90 Base) MCG/ACT inhaler Inhale 2 puffs into the lungs every 6 (six) hours as needed for wheezing or shortness of breath. 1 Inhaler 2   atorvastatin (LIPITOR) 10 MG tablet Take 1 tablet by mouth daily.     cetirizine (ZYRTEC) 10 MG tablet Take 10 mg by mouth daily.     docusate sodium (COLACE) 100 MG capsule Take 100 mg by mouth 2 (two) times daily.     DULoxetine (CYMBALTA) 30 MG capsule Take 30 mg by mouth daily.     ferrous sulfate 325 (65 FE) MG tablet Take 325 mg by mouth daily.      gabapentin (NEURONTIN) 300 MG capsule Take 2 Capsules (600 mg) at Bedtime and 1 capsule (300 mg) in the afternoon 90 capsule 2   JARDIANCE 10 MG TABS tablet Take 10 mg by mouth daily.     latanoprost (XALATAN) 0.005 % ophthalmic solution 1 drop at bedtime.     lisinopril (ZESTRIL) 10 MG tablet Take 1 tablet (10 mg total) by mouth daily. 30 tablet 1   Melatonin 1 MG TABS Take 1 tablet by mouth at bedtime.     moxifloxacin (VIGAMOX) 0.5 % ophthalmic solution 1 drop 3 (three) times daily.     Multiple Vitamins-Minerals (WOMENS MULTIVITAMIN PO) Take 1 tablet by mouth daily.     prednisoLONE acetate (PRED FORTE) 1 % ophthalmic suspension INSTILL 1 DROP 4 TIMES DAILY AS DIRECTED. BEGINNING AFTER SURGERY      rosuvastatin (CRESTOR) 5 MG tablet Take 1 tablet (5 mg total) by mouth daily at 6 PM. 30 tablet 1   sitaGLIPtin (JANUVIA) 100 MG tablet Take 100 mg by mouth daily.     vitamin B-12 (CYANOCOBALAMIN) 500 MCG tablet Take 1 tablet (500 mcg total) by mouth daily. 90 tablet 1   apixaban (ELIQUIS) 2.5 MG TABS tablet Take 1 tablet (2.5 mg total) by mouth 2 (two) times daily. 60 tablet 4   famotidine (PEPCID) 20 MG tablet Take 1 tablet (20 mg total) by mouth 2 (two) times daily. 60 tablet 1   folic acid (FOLVITE) 1 MG tablet Take 1 mg by mouth daily.     ketorolac (ACULAR) 0.4 % SOLN INSTILL 1 DROP INTO AFFECTED EYE 4 TIMES DAILY. BEGINNING AFTER SURGERY.     methotrexate (RHEUMATREX) 2.5 MG tablet Take 15 mg by mouth once a week.     Na Sulfate-K Sulfate-Mg Sulf 17.5-3.13-1.6 GM/177ML SOLN At 5 PM the day before procedure take 1 bottle and 5 hours before procedure take 1 bottle. (Patient not taking: No sig reported) 708 mL 0   No current facility-administered medications for this visit.    Past Medical History:  Diagnosis Date   Anemia  of chronic disease 05/16/2019   Anxiety    h/o   COPD (chronic obstructive pulmonary disease) (HCC)    Diabetes mellitus without complication (HCC)    GERD (gastroesophageal reflux disease)    Hypertension    bp under control-off meds since 2019    Past Surgical History:  Procedure Laterality Date   ABDOMINAL HYSTERECTOMY     AMPUTATION Right 12/27/2018   Procedure: AMPUTATION BELOW KNEE;  Surgeon: Annice Needy, MD;  Location: [facility];  Service: General;  Laterality: Right;   CATARACT EXTRACTION W/PHACO Right 03/27/2020   Procedure: CATARACT EXTRACTION PHACO AND INTRAOCULAR LENS PLACEMENT (IOC) RIGHT DIABETIC 7.54 00:52.5;  Surgeon: Galen Manila, MD;  Location: [facility];  Service: Ophthalmology;  Laterality: Right;   CATARACT EXTRACTION W/PHACO Left 05/20/2020   Procedure: CATARACT EXTRACTION PHACO AND INTRAOCULAR LENS PLACEMENT (IOC) LEFT DIABETIC  4.95 00:37.6;  Surgeon: Galen Manila, MD;  Location: [facility];  Service: Ophthalmology;  Laterality: Left;  Diabetic - oral meds COVID + 04-24-20   CHOLECYSTECTOMY     COLONOSCOPY WITH PROPOFOL N/A 04/16/2019   Procedure: COLONOSCOPY WITH PROPOFOL;  Surgeon: Pasty Spillers, MD;  Location: [facility];  Service: Endoscopy;  Laterality: N/A;   COLONOSCOPY WITH PROPOFOL N/A 01/16/2020   Procedure: COLONOSCOPY WITH PROPOFOL;  Surgeon: Pasty Spillers, MD;  Location: [facility];  Service: Endoscopy;  Laterality: N/A;   ESOPHAGOGASTRODUODENOSCOPY (EGD) WITH PROPOFOL N/A 06/15/2018   Procedure: ESOPHAGOGASTRODUODENOSCOPY (EGD) WITH PROPOFOL;  Surgeon: Toledo, Boykin Nearing, MD;  Location: [facility];  Service: Gastroenterology;  Laterality: N/A;   ESOPHAGOGASTRODUODENOSCOPY (EGD) WITH PROPOFOL N/A 01/03/2019   Procedure: ESOPHAGOGASTRODUODENOSCOPY (EGD) WITH PROPOFOL;  Surgeon: Midge Minium, MD;  Location: [facility];  Service: Endoscopy;  Laterality: N/A;   GIVENS CAPSULE STUDY  04/16/2019   Procedure: GIVENS CAPSULE STUDY;  Surgeon: Pasty Spillers, MD;  Location: [facility];  Service: Endoscopy;;   GIVENS CAPSULE STUDY N/A 06/25/2019   Procedure: GIVENS CAPSULE STUDY;  Surgeon: Wyline Mood, MD;  Location: [facility];  Service: Gastroenterology;  Laterality: N/A;   LOWER EXTREMITY ANGIOGRAPHY Right 08/21/2018   Procedure: LOWER EXTREMITY ANGIOGRAPHY;  Surgeon: Annice Needy, MD;  Location: [facility];  Service: Cardiovascular;  Laterality: Right;   LOWER EXTREMITY ANGIOGRAPHY Left 08/28/2018   Procedure: LOWER EXTREMITY ANGIOGRAPHY;  Surgeon: Annice Needy, MD;  Location: [facility];  Service: Cardiovascular;  Laterality: Left;   LOWER EXTREMITY ANGIOGRAPHY Right 08/28/2018   Procedure: Lower Extremity Angiography;  Surgeon: Annice Needy, MD;  Location: [facility];  Service: Cardiovascular;  Laterality: Right;   LOWER EXTREMITY  ANGIOGRAPHY Right 12/18/2018   Procedure: Lower Extremity Angiography;  Surgeon: Annice Needy, MD;  Location: [facility];  Service: Cardiovascular;  Laterality: Right;   LOWER EXTREMITY ANGIOGRAPHY Right 12/18/2018   Procedure: Lower Extremity Angiography;  Surgeon: Annice Needy, MD;  Location: [facility];  Service: Cardiovascular;  Laterality: Right;   LOWER EXTREMITY ANGIOGRAPHY Left 12/21/2018   Procedure: Lower Extremity Angiography;  Surgeon: Annice Needy, MD;  Location: [facility];  Service: Cardiovascular;  Laterality: Left;   LOWER EXTREMITY ANGIOGRAPHY Right 12/21/2018   Procedure: Lower Extremity Angiography;  Surgeon: Annice Needy, MD;  Location: [facility];  Service: Cardiovascular;  Laterality: Right;   LOWER EXTREMITY INTERVENTION N/A 12/22/2018   Procedure: LOWER EXTREMITY INTERVENTION;  Surgeon: Annice Needy, MD;  Location: [facility];  Service: Cardiovascular;  Laterality: N/A;     Social History   Tobacco Use   Smoking status: Some Days    Packs/day: 0.25    Years: 45.00    Pack years: 11.25    Types: Cigarettes   Smokeless tobacco: Never   Tobacco comments:    quit  Vaping Use   Vaping Use: Never used  Substance Use Topics   Alcohol use: No   Drug use: Not Currently    Comment: last used in April 2021 per patient      Family History  Problem Relation Age of Onset   Breast cancer Mother 60     Allergies  Allergen Reactions   Metformin And Related Diarrhea   Penicillins Hives    Has patient had a PCN reaction causing immediate rash, facial/tongue/throat swelling, SOB or lightheadedness with hypotension: Yes Has patient had a PCN reaction causing severe rash involving mucus membranes or skin necrosis: No Has patient had a PCN reaction that required hospitalization: No Has patient had a PCN reaction occurring within the last 10 years: No If all of the above answers are "NO", then may proceed with Cephalosporin  use.   Tramadol Itching     REVIEW OF SYSTEMS (Negative unless checked)  Constitutional: [] Weight loss  [] Fever  [] Chills Cardiac: [] Chest pain   [] Chest pressure   [] Palpitations   [] Shortness of breath when laying flat   [] Shortness of breath at rest   [] Shortness of breath with exertion. Vascular:  [] Pain in legs with walking   [] Pain in legs at rest   [] Pain in legs when laying flat   [] Claudication   [] Pain in feet when walking  [] Pain in feet at rest  [] Pain in feet when laying flat   [] History of DVT   [] Phlebitis   [] Swelling in legs   [] Varicose veins   [] Non-healing ulcers Pulmonary:   [] Uses home oxygen   [] Productive cough   [] Hemoptysis   [] Wheeze  [] COPD   [] Asthma Neurologic:  [] Dizziness  [] Blackouts   [] Seizures   [] History of stroke   [] History of TIA  [] Aphasia   [] Temporary blindness   [] Dysphagia   [] Weakness or numbness in arms   [] Weakness or numbness in legs Musculoskeletal:  [] Arthritis   [] Joint swelling   [x] Joint pain   [] Low back pain Hematologic:  [] Easy bruising  [] Easy bleeding   [] Hypercoagulable state   [] Anemic   Gastrointestinal:  [] Blood in stool   [] Vomiting blood  [x] Gastroesophageal reflux/heartburn   [] Abdominal pain Genitourinary:  [x] Chronic kidney disease   [] Difficult urination  [] Frequent urination  [] Burning with urination   [] Hematuria Skin:  [] Rashes   [] Ulcers   [] Wounds Psychological:  [] History of anxiety   []  History of major depression.  Physical Examination  BP (!) 158/77 (BP Location: Right Arm)   Pulse (!) 106   Resp 16   Wt 143 lb (64.9 kg)   BMI 23.08 kg/m  Gen:  WD/WN, NAD Head: [place]/AT, No temporalis wasting. Ear/Nose/Throat: Hearing grossly intact, nares w/o erythema or drainage Eyes: Conjunctiva clear. Sclera non-icteric Neck: Supple.  Trachea midline Pulmonary:  Good air movement, no use of accessory muscles.  Cardiac: RRR, no JVD Vascular:  Vessel Right Left  Radial Palpable Palpable           Musculoskeletal: M/S  5/5 throughout.  No deformity or atrophy. Right BKA well healed. No edema. Neurologic: Sensation grossly intact in extremities.  Symmetrical.  Speech is fluent.  Psychiatric: Judgment intact, Mood & affect appropriate for  pt's clinical situation. Dermatologic: No rashes or ulcers noted.  No cellulitis or open wounds.      Labs Recent Results (from the past 2160 hour(s))  Iron and TIBC     Status: None   Collection Time: 11/26/20 11:15 AM  Result Value Ref Range   Iron 84 28 - 170 ug/dL   TIBC 161 096 - 045 ug/dL   Saturation Ratios 23 10.4 - 31.8 %   UIBC 281 ug/dL    Comment: Performed at [facility], [address] [place], [place] [address]  Ferritin     Status: None   Collection Time: 11/26/20 11:15 AM  Result Value Ref Range   Ferritin 108 11 - 307 ng/mL    Comment: Performed at [facility], [address] [place], [place] [address]  Comprehensive metabolic panel     Status: Abnormal   Collection Time: 11/26/20 11:15 AM  Result Value Ref Range   Sodium 135 135 - 145 mmol/L   Potassium 3.7 3.5 - 5.1 mmol/L   Chloride 105 98 - 111 mmol/L   CO2 21 (L) 22 - 32 mmol/L   Glucose, Bld 208 (H) 70 - 99 mg/dL    Comment: Glucose reference range applies only to samples taken after fasting for at least 8 hours.   BUN 15 8 - 23 mg/dL   Creatinine, Ser 8.29 (H) 0.44 - 1.00 mg/dL   Calcium 9.7 8.9 - 56.2 mg/dL   Total Protein 7.8 6.5 - 8.1 g/dL   Albumin 4.2 3.5 - 5.0 g/dL   AST 16 15 - 41 U/L   ALT 14 0 - 44 U/L   Alkaline Phosphatase 83 38 - 126 U/L   Total Bilirubin 0.9 0.3 - 1.2 mg/dL   GFR, Estimated 56 (L) >60 mL/min    Comment: (NOTE) Calculated using the CKD-EPI Creatinine Equation (2021)    Anion gap 9 5 - 15    Comment: Performed at [facility], [address] [place], [place] [address]  CBC with Differential/Platelet     Status: Abnormal   Collection Time: 11/26/20 11:15 AM  Result Value Ref Range   WBC 8.6 4.0 - 10.5 K/uL   RBC  3.66 (L) 3.87 - 5.11 MIL/uL   Hemoglobin 11.1 (L) 12.0 - 15.0 g/dL   HCT 57.8 (L) 46.9 - 62.9 %   MCV 87.4 80.0 - 100.0 fL   MCH 30.3 26.0 - 34.0 pg   MCHC 34.7 30.0 - 36.0 g/dL   RDW 52.8 41.3 - 24.4 %   Platelets 332 150 - 400 K/uL   nRBC 0.0 0.0 - 0.2 %   Neutrophils Relative % 65 %   Neutro Abs 5.6 1.7 - 7.7 K/uL   Lymphocytes Relative 27 %   Lymphs Abs 2.3 0.7 - 4.0 K/uL   Monocytes Relative 6 %   Monocytes Absolute 0.5 0.1 - 1.0 K/uL   Eosinophils Relative 1 %   Eosinophils Absolute 0.1 0.0 - 0.5 K/uL   Basophils Relative 1 %   Basophils Absolute 0.1 0.0 - 0.1 K/uL   Immature Granulocytes 0 %   Abs Immature Granulocytes 0.01 0.00 - 0.07 K/uL    Comment: Performed at [facility], [address] [place], [place] [address]    Radiology No results found.  Assessment/Plan HTN (hypertension) blood pressure control important in reducing the progression of atherosclerotic disease. On appropriate oral medications.     Hx of right BKA (HCC) Two years ago, but now  her prosthesis is poorly fitting. New Rx for a socket for her right BKA prosthesis.   HLD (hyperlipidemia) lipid control important in reducing the progression of atherosclerotic disease. Continue statin therapy     Non-insulin dependent type 2 diabetes mellitus (HCC) blood glucose control important in reducing the progression of atherosclerotic disease. Also, involved in wound healing. On appropriate medications.  Atherosclerosis of artery of extremity with rest pain (HCC) Complaining of some left foot pain.  Check ABIs in next few weeks.    [name], MD  11/28/2020 1:35 PM    This note was created with Dragon medical transcription system.  Any errors from dictation are purely unintentional

## 2020-11-28 NOTE — Assessment & Plan Note (Signed)
Complaining of some left foot pain.  Check ABIs in next few weeks.

## 2020-11-29 ENCOUNTER — Encounter: Payer: Self-pay | Admitting: Oncology

## 2020-11-29 NOTE — Progress Notes (Signed)
Hematology/Oncology  note [facility] Telephone:([phone] [phone]   Patient Care Team: [facility] as PCP - General (General Practice) Rickard Patience, MD as Consulting Physician (Hematology and Oncology)  REFERRING PROVIDER: [facility] CHIEF COMPLAINTS/REASON FOR VISIT:  Follow up of anemia  HISTORY OF PRESENTING ILLNESS:   Reviewed patient's recent labs  05/09/2019, hemoglobin 8.1, MCV 88, platelet 470. 04/16/2019, iron panel showed saturation 18, ferritin 28, TIBC 308 Patient has been started on oral iron supplementation NiFerrex 150 mg daily.  She reports tolerating well.  Reviewed patient's previous labs ordered by primary care physician's office, anemia is chronic onset , duration is since at least July 2017.  Patient's hemoglobin was about 9 back in September 2020.  She was admitted at that time due to severe PAD and right foot gangrene.  Status post right below-knee amputation.   She was admitted in December due to acute drop of hemoglobin to 6.  She has a history of GI bleed secondary to gastric ulcer.  Received PRBC transfusion.  Patient is on Eliquis 2.5 mg twice daily. No aggravating or improving factors.  Last endoscopy: 04/13/2019 EGD colonoscopy and small bowel capsule study during her hospitalization.  EGD was negative, 04/16/2019 colonoscopy with small polyps removed, 04/18/2019 small bowel capsule study did not reach this small bowel and was inconclusive.   History of recurrent acute GI bleeding/melena, history of PUD on chronic anticoagulation. 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    INTERVAL HISTORY Sylvia [name] is a 65 y.o. female who has above history reviewed by me today presents for follow up visit for management of anemia She reports feeling well. No black or bloody stool.  She was diagnosed RA, follows up with rheumatology Dr.Patel. On MTX with folic acid and plaquenil.    Review of Systems  Constitutional:  Positive for fatigue. Negative for appetite change, chills, fever and unexpected weight change.  HENT:   Negative for hearing loss and voice change.   Eyes:  Negative for eye problems.  Respiratory:  Negative for chest tightness and cough.   Cardiovascular:  Negative for chest pain.  Gastrointestinal:  Negative for abdominal distention, abdominal pain and blood in stool.  Endocrine: Negative for hot flashes.  Genitourinary:  Negative for difficulty urinating and frequency.   Musculoskeletal:  Negative for arthralgias.  Skin:  Negative for itching and rash.  Neurological:  Negative for extremity weakness.  Hematological:  Negative for adenopathy.  Psychiatric/Behavioral:  Negative for confusion.    MEDICAL HISTORY:  Past Medical History:  Diagnosis Date   Anemia of chronic disease 05/16/2019   Anxiety    h/o   COPD (chronic obstructive pulmonary disease) (HCC)    Diabetes mellitus without complication (HCC)    GERD (gastroesophageal reflux disease)    Hypertension    bp under control-off meds since 2019    SURGICAL HISTORY: Past Surgical History:  Procedure Laterality Date   ABDOMINAL HYSTERECTOMY     AMPUTATION Right 12/27/2018   Procedure: AMPUTATION BELOW KNEE;  Surgeon: Annice Needy, MD;  Location: [facility];  Service: General;  Laterality: Right;   CATARACT EXTRACTION W/PHACO Right 03/27/2020   Procedure: CATARACT EXTRACTION PHACO AND INTRAOCULAR LENS PLACEMENT (IOC) RIGHT DIABETIC 7.54 00:52.5;  Surgeon: Galen Manila, MD;  Location: [facility];  Service: Ophthalmology;  Laterality: Right;   CATARACT EXTRACTION W/PHACO Left 05/20/2020   Procedure: CATARACT EXTRACTION PHACO AND INTRAOCULAR LENS PLACEMENT (IOC) LEFT DIABETIC 4.95 00:37.6;  Surgeon:  Galen Manila, MD;  Location: [facility];  Service: Ophthalmology;  Laterality: Left;  Diabetic - oral meds COVID + 04-24-20   CHOLECYSTECTOMY     COLONOSCOPY WITH PROPOFOL  N/A 04/16/2019   Procedure: COLONOSCOPY WITH PROPOFOL;  Surgeon: Pasty Spillers, MD;  Location: [facility];  Service: Endoscopy;  Laterality: N/A;   COLONOSCOPY WITH PROPOFOL N/A 01/16/2020   Procedure: COLONOSCOPY WITH PROPOFOL;  Surgeon: Pasty Spillers, MD;  Location: [facility];  Service: Endoscopy;  Laterality: N/A;   ESOPHAGOGASTRODUODENOSCOPY (EGD) WITH PROPOFOL N/A 06/15/2018   Procedure: ESOPHAGOGASTRODUODENOSCOPY (EGD) WITH PROPOFOL;  Surgeon: Toledo, Boykin Nearing, MD;  Location: [facility];  Service: Gastroenterology;  Laterality: N/A;   ESOPHAGOGASTRODUODENOSCOPY (EGD) WITH PROPOFOL N/A 01/03/2019   Procedure: ESOPHAGOGASTRODUODENOSCOPY (EGD) WITH PROPOFOL;  Surgeon: Midge Minium, MD;  Location: [facility];  Service: Endoscopy;  Laterality: N/A;   GIVENS CAPSULE STUDY  04/16/2019   Procedure: GIVENS CAPSULE STUDY;  Surgeon: Pasty Spillers, MD;  Location: [facility];  Service: Endoscopy;;   GIVENS CAPSULE STUDY N/A 06/25/2019   Procedure: GIVENS CAPSULE STUDY;  Surgeon: Wyline Mood, MD;  Location: [facility];  Service: Gastroenterology;  Laterality: N/A;   LOWER EXTREMITY ANGIOGRAPHY Right 08/21/2018   Procedure: LOWER EXTREMITY ANGIOGRAPHY;  Surgeon: Annice Needy, MD;  Location: [facility];  Service: Cardiovascular;  Laterality: Right;   LOWER EXTREMITY ANGIOGRAPHY Left 08/28/2018   Procedure: LOWER EXTREMITY ANGIOGRAPHY;  Surgeon: Annice Needy, MD;  Location: [facility];  Service: Cardiovascular;  Laterality: Left;   LOWER EXTREMITY ANGIOGRAPHY Right 08/28/2018   Procedure: Lower Extremity Angiography;  Surgeon: Annice Needy, MD;  Location: [facility];  Service: Cardiovascular;  Laterality: Right;   LOWER EXTREMITY ANGIOGRAPHY Right 12/18/2018   Procedure: Lower Extremity Angiography;  Surgeon: Annice Needy, MD;  Location: [facility];  Service: Cardiovascular;  Laterality: Right;   LOWER EXTREMITY ANGIOGRAPHY Right  12/18/2018   Procedure: Lower Extremity Angiography;  Surgeon: Annice Needy, MD;  Location: [facility];  Service: Cardiovascular;  Laterality: Right;   LOWER EXTREMITY ANGIOGRAPHY Left 12/21/2018   Procedure: Lower Extremity Angiography;  Surgeon: Annice Needy, MD;  Location: [facility];  Service: Cardiovascular;  Laterality: Left;   LOWER EXTREMITY ANGIOGRAPHY Right 12/21/2018   Procedure: Lower Extremity Angiography;  Surgeon: Annice Needy, MD;  Location: [facility];  Service: Cardiovascular;  Laterality: Right;   LOWER EXTREMITY INTERVENTION N/A 12/22/2018   Procedure: LOWER EXTREMITY INTERVENTION;  Surgeon: Annice Needy, MD;  Location: [facility];  Service: Cardiovascular;  Laterality: N/A;    SOCIAL HISTORY: Social History   Socioeconomic History   Marital status: Divorced    Spouse name: Not on file   Number of children: Not on file   Years of education: Not on file   Highest education level: Not on file  Occupational History   Not on file  Tobacco Use   Smoking status: Some Days    Packs/day: 0.25    Years: 45.00    Pack years: 11.25    Types: Cigarettes   Smokeless tobacco: Never   Tobacco comments:    quit  Vaping Use   Vaping Use: Never used  Substance and Sexual Activity   Alcohol use: No   Drug use: Not Currently    Comment: last used in April 2021 per patient   Sexual activity: Not Currently  Other Topics Concern   Not on file  Social History Narrative   Not on file   Social Determinants of Health   Financial Resource Strain: Not on file  Food Insecurity: Not on file  Transportation Needs: Not on file  Physical Activity: Not on file  Stress: Not on file  Social Connections: Not on file  Intimate Partner Violence: Not on file    FAMILY HISTORY: Family History  Problem Relation Age of Onset   Breast cancer Mother 33    ALLERGIES:  is allergic to metformin and related, penicillins, and tramadol.  MEDICATIONS:   Current Outpatient Medications  Medication Sig Dispense Refill   acetaminophen (TYLENOL) 325 MG tablet Take 650 mg by mouth every 6 (six) hours as needed.     albuterol (PROVENTIL HFA;VENTOLIN HFA) 108 (90 Base) MCG/ACT inhaler Inhale 2 puffs into the lungs every 6 (six) hours as needed for wheezing or shortness of breath. 1 Inhaler 2   atorvastatin (LIPITOR) 10 MG tablet Take 1 tablet by mouth daily.     cetirizine (ZYRTEC) 10 MG tablet Take 10 mg by mouth daily.     docusate sodium (COLACE) 100 MG capsule Take 100 mg by mouth 2 (two) times daily.     DULoxetine (CYMBALTA) 30 MG capsule Take 30 mg by mouth daily.     ferrous sulfate 325 (65 FE) MG tablet Take 325 mg by mouth daily.      gabapentin (NEURONTIN) 300 MG capsule Take 2 Capsules (600 mg) at Bedtime and 1 capsule (300 mg) in the afternoon 90 capsule 2   JARDIANCE 10 MG TABS tablet Take 10 mg by mouth daily.     latanoprost (XALATAN) 0.005 % ophthalmic solution 1 drop at bedtime.     lisinopril (ZESTRIL) 10 MG tablet Take 1 tablet (10 mg total) by mouth daily. 30 tablet 1   Melatonin 1 MG TABS Take 1 tablet by mouth at bedtime.     moxifloxacin (VIGAMOX) 0.5 % ophthalmic solution 1 drop 3 (three) times daily.     Multiple Vitamins-Minerals (WOMENS MULTIVITAMIN PO) Take 1 tablet by mouth daily.     prednisoLONE acetate (PRED FORTE) 1 % ophthalmic suspension INSTILL 1 DROP 4 TIMES DAILY AS DIRECTED. BEGINNING AFTER SURGERY     rosuvastatin (CRESTOR) 5 MG tablet Take 1 tablet (5 mg total) by mouth daily at 6 PM. 30 tablet 1   sitaGLIPtin (JANUVIA) 100 MG tablet Take 100 mg by mouth daily.     vitamin B-12 (CYANOCOBALAMIN) 500 MCG tablet Take 1 tablet (500 mcg total) by mouth daily. 90 tablet 1   apixaban (ELIQUIS) 2.5 MG TABS tablet Take 1 tablet (2.5 mg total) by mouth 2 (two) times daily. 60 tablet 4   famotidine (PEPCID) 20 MG tablet Take 1 tablet (20 mg total) by mouth 2 (two) times daily. 60 tablet 1   folic acid (FOLVITE) 1 MG  tablet Take 1 mg by mouth daily.     ketorolac (ACULAR) 0.4 % SOLN INSTILL 1 DROP INTO AFFECTED EYE 4 TIMES DAILY. BEGINNING AFTER SURGERY.     methotrexate (RHEUMATREX) 2.5 MG tablet Take 15 mg by mouth once a week.     Na Sulfate-K Sulfate-Mg Sulf 17.5-3.13-1.6 GM/177ML SOLN At 5 PM the day before procedure take 1 bottle and 5 hours before procedure take 1 bottle. (Patient not taking: No sig reported) 708 mL 0   No current facility-administered medications for this visit.     PHYSICAL EXAMINATION: ECOG PERFORMANCE STATUS: 1 - Symptomatic but completely ambulatory Vitals:   11/28/20  1000  BP: 112/71  Pulse: 99  Resp: 18  Temp: 99.1 F (37.3 C)  SpO2: 99%   Filed Weights   11/28/20 1000  Weight: 141 lb 9.6 oz (64.2 kg)    Physical Exam Constitutional:      General: She is not in acute distress.    Comments: Patient walks with a cane  HENT:     Head: Normocephalic and atraumatic.  Eyes:     General: No scleral icterus. Cardiovascular:     Rate and Rhythm: Normal rate and regular rhythm.     Heart sounds: Normal heart sounds.  Pulmonary:     Effort: Pulmonary effort is normal. No respiratory distress.     Breath sounds: No wheezing.  Abdominal:     General: Bowel sounds are normal. There is no distension.     Palpations: Abdomen is soft.  Musculoskeletal:        General: No deformity. Normal range of motion.     Cervical back: Normal range of motion and neck supple.     Comments: Right lower extremity below-knee amputation,  Skin:    General: Skin is warm and dry.     Findings: No erythema or rash.  Neurological:     Mental Status: She is alert and oriented to person, place, and time. Mental status is at baseline.     Cranial Nerves: No cranial nerve deficit.     Coordination: Coordination normal.  Psychiatric:        Mood and Affect: Mood normal.      CMP Latest Ref Rng & Units 11/26/2020  Glucose 70 - 99 mg/dL 161(W)  BUN 8 - 23 mg/dL 15  Creatinine 9.60 -  1.00 mg/dL 4.54(U)  Sodium 981 - 191 mmol/L 135  Potassium 3.5 - 5.1 mmol/L 3.7  Chloride 98 - 111 mmol/L 105  CO2 22 - 32 mmol/L 21(L)  Calcium 8.9 - 10.3 mg/dL 9.7  Total Protein 6.5 - 8.1 g/dL 7.8  Total Bilirubin 0.3 - 1.2 mg/dL 0.9  Alkaline Phos 38 - 126 U/L 83  AST 15 - 41 U/L 16  ALT 0 - 44 U/L 14   CBC Latest Ref Rng & Units 11/26/2020  WBC 4.0 - 10.5 K/uL 8.6  Hemoglobin 12.0 - 15.0 g/dL 11.1(L)  Hematocrit 36.0 - 46.0 % 32.0(L)  Platelets 150 - 400 K/uL 332     LABORATORY DATA:  I have reviewed the data as listed Lab Results  Component Value Date   WBC 8.6 11/26/2020   HGB 11.1 (L) 11/26/2020   HCT 32.0 (L) 11/26/2020   MCV 87.4 11/26/2020   PLT 332 11/26/2020   Recent Labs    04/24/20 1434 05/28/20 1057 11/26/20 1115  NA 142 137 135  K 3.6 3.8 3.7  CL 110 106 105  CO2 22 19* 21*  GLUCOSE 138* 320* 208*  BUN CREATININE 0.55 0.84 1.10*  CALCIUM 9.4 9.4 9.7  GFRNONAA >60 >60 56*  PROT 7.8 7.9 7.8  ALBUMIN 3.9 3.9 4.2  AST ALT ALKPHOS 78 92 83  BILITOT 0.8 0.8 0.9    Iron/TIBC/Ferritin/ %Sat    Component Value Date/Time   IRON 84 11/26/2020 1115   TIBC 365 11/26/2020 1115   FERRITIN 108 11/26/2020 1115   IRONPCTSAT 23 11/26/2020 1115     No results found.    ASSESSMENT & PLAN:  1. Anemia of chronic disease   2. Hyperglycemia  Anemia of chronic disease - RA and CKD. Labs are reviewed and discussed with patient. Stable Hb 11.1. Iron level is stable.   Hyperglycemia, glucose level of 208  She has diabetes.  Continue follow-up with primary for management of uncontrolled diabetes.  Follow-up in 12 months  Orders Placed This Encounter  Procedures   CBC with Differential/Platelet    Standing Status:   Future    Standing Expiration Date:   11/28/2021   Comprehensive metabolic panel    Standing Status:   Future    Standing Expiration Date:   11/28/2021   Ferritin    Standing Status:   Future    Standing  Expiration Date:   11/28/2021   Iron and TIBC    Standing Status:   Future    Standing Expiration Date:   11/28/2021    All questions were answered. The patient knows to call the clinic with any problems questions or concerns.    Rickard Patience, MD, PhD Hematology Oncology [facility] at [facility] Pager- [phone] 11/29/2020

## 2020-12-10 ENCOUNTER — Encounter (INDEPENDENT_AMBULATORY_CARE_PROVIDER_SITE_OTHER): Payer: Self-pay | Admitting: Nurse Practitioner

## 2020-12-10 ENCOUNTER — Ambulatory Visit (INDEPENDENT_AMBULATORY_CARE_PROVIDER_SITE_OTHER): Payer: Medicare Other

## 2020-12-10 ENCOUNTER — Ambulatory Visit (INDEPENDENT_AMBULATORY_CARE_PROVIDER_SITE_OTHER): Payer: Medicare Other | Admitting: Nurse Practitioner

## 2020-12-10 ENCOUNTER — Other Ambulatory Visit: Payer: Self-pay

## 2020-12-10 VITALS — BP 142/80 | HR 93 | Resp 16 | Wt 141.4 lb

## 2020-12-10 DIAGNOSIS — I739 Peripheral vascular disease, unspecified: Secondary | ICD-10-CM

## 2020-12-10 DIAGNOSIS — E119 Type 2 diabetes mellitus without complications: Secondary | ICD-10-CM | POA: Diagnosis not present

## 2020-12-10 DIAGNOSIS — I70229 Atherosclerosis of native arteries of extremities with rest pain, unspecified extremity: Secondary | ICD-10-CM

## 2020-12-10 DIAGNOSIS — I10 Essential (primary) hypertension: Secondary | ICD-10-CM | POA: Diagnosis not present

## 2020-12-10 NOTE — Progress Notes (Signed)
Subjective:    Patient ID: Sylvia [name], female    DOB: 1955/08/09, 65 y.o.   MRN: [id number] Chief Complaint  Patient presents with   Follow-up    Ultrasound follow up    Sylvia [name] is a 65 year old female that returns today due to having more pain in her left lower extremity recently.  The patient has a right below-knee amputation.  She was recently seen due to her below-knee amputation not fitting well and needing to be resized.  However she notes that she has been having a lot more pain in her left lower extremity and was concerned that this may be related to her circulation.  She denies any open wounds or ulcerations on either the stump or the left lower extremity.  Today noninvasive studies show an ABI of 0.96 on the right with a TBI of 0.65.  This is an improvement from her previous studies on 06/10/2020 which showed an ABI of 0.76 with a TBI of 0.52.  He had biphasic tibial artery waveforms with very slightly dampened toe waveforms.     Review of Systems  Musculoskeletal:  Positive for arthralgias.  All other systems reviewed and are negative.     Objective:   Physical Exam Vitals reviewed.  HENT:     Head: Normocephalic.  Cardiovascular:     Rate and Rhythm: Normal rate.  Pulmonary:     Effort: Pulmonary effort is normal.  Musculoskeletal:     Right Lower Extremity: Right leg is amputated below knee.  Skin:    General: Skin is warm and dry.  Neurological:     Mental Status: She is alert and oriented to person, place, and time.  Psychiatric:        Mood and Affect: Mood normal.        Behavior: Behavior normal.        Thought Content: Thought content normal.        Judgment: Judgment normal.  Dampened toe waveforms.  BP (!) 142/80 (BP Location: Right Arm)   Pulse 93   Resp 16   Wt 141 lb 6.4 oz (64.1 kg)   BMI 22.82 kg/m   Past Medical History:  Diagnosis Date   Anemia of chronic disease 05/16/2019   Anxiety    h/o   COPD (chronic  obstructive pulmonary disease) (HCC)    Diabetes mellitus without complication (HCC)    GERD (gastroesophageal reflux disease)    Hypertension    bp under control-off meds since 2019    Social History   Socioeconomic History   Marital status: Divorced    Spouse name: Not on file   Number of children: Not on file   Years of education: Not on file   Highest education level: Not on file  Occupational History   Not on file  Tobacco Use   Smoking status: Some Days    Packs/day: 0.25    Years: 45.00    Pack years: 11.25    Types: Cigarettes   Smokeless tobacco: Never   Tobacco comments:    quit  Vaping Use   Vaping Use: Never used  Substance and Sexual Activity   Alcohol use: No   Drug use: Not Currently    Comment: last used in April 2021 per patient   Sexual activity: Not Currently  Other Topics Concern   Not on file  Social History Narrative   Not on file   Social Determinants of Health   Financial Resource Strain: Not on file  Food Insecurity: Not on file  Transportation Needs: Not on file  Physical Activity: Not on file  Stress: Not on file  Social Connections: Not on file  Intimate Partner Violence: Not on file    Past Surgical History:  Procedure Laterality Date   ABDOMINAL HYSTERECTOMY     AMPUTATION Right 12/27/2018   Procedure: AMPUTATION BELOW KNEE;  Surgeon: Annice Needy, MD;  Location: [facility];  Service: General;  Laterality: Right;   CATARACT EXTRACTION W/PHACO Right 03/27/2020   Procedure: CATARACT EXTRACTION PHACO AND INTRAOCULAR LENS PLACEMENT (IOC) RIGHT DIABETIC 7.54 00:52.5;  Surgeon: Galen Manila, MD;  Location: [facility];  Service: Ophthalmology;  Laterality: Right;   CATARACT EXTRACTION W/PHACO Left 05/20/2020   Procedure: CATARACT EXTRACTION PHACO AND INTRAOCULAR LENS PLACEMENT (IOC) LEFT DIABETIC 4.95 00:37.6;  Surgeon: Galen Manila, MD;  Location: [facility];  Service: Ophthalmology;  Laterality: Left;  Diabetic -  oral meds COVID + 04-24-20   CHOLECYSTECTOMY     COLONOSCOPY WITH PROPOFOL N/A 04/16/2019   Procedure: COLONOSCOPY WITH PROPOFOL;  Surgeon: Pasty Spillers, MD;  Location: [facility];  Service: Endoscopy;  Laterality: N/A;   COLONOSCOPY WITH PROPOFOL N/A 01/16/2020   Procedure: COLONOSCOPY WITH PROPOFOL;  Surgeon: Pasty Spillers, MD;  Location: [facility];  Service: Endoscopy;  Laterality: N/A;   ESOPHAGOGASTRODUODENOSCOPY (EGD) WITH PROPOFOL N/A 06/15/2018   Procedure: ESOPHAGOGASTRODUODENOSCOPY (EGD) WITH PROPOFOL;  Surgeon: Toledo, Boykin Nearing, MD;  Location: [facility];  Service: Gastroenterology;  Laterality: N/A;   ESOPHAGOGASTRODUODENOSCOPY (EGD) WITH PROPOFOL N/A 01/03/2019   Procedure: ESOPHAGOGASTRODUODENOSCOPY (EGD) WITH PROPOFOL;  Surgeon: Midge Minium, MD;  Location: [facility];  Service: Endoscopy;  Laterality: N/A;   GIVENS CAPSULE STUDY  04/16/2019   Procedure: GIVENS CAPSULE STUDY;  Surgeon: Pasty Spillers, MD;  Location: [facility];  Service: Endoscopy;;   GIVENS CAPSULE STUDY N/A 06/25/2019   Procedure: GIVENS CAPSULE STUDY;  Surgeon: Wyline Mood, MD;  Location: [facility];  Service: Gastroenterology;  Laterality: N/A;   LOWER EXTREMITY ANGIOGRAPHY Right 08/21/2018   Procedure: LOWER EXTREMITY ANGIOGRAPHY;  Surgeon: Annice Needy, MD;  Location: [facility];  Service: Cardiovascular;  Laterality: Right;   LOWER EXTREMITY ANGIOGRAPHY Left 08/28/2018   Procedure: LOWER EXTREMITY ANGIOGRAPHY;  Surgeon: Annice Needy, MD;  Location: [facility];  Service: Cardiovascular;  Laterality: Left;   LOWER EXTREMITY ANGIOGRAPHY Right 08/28/2018   Procedure: Lower Extremity Angiography;  Surgeon: Annice Needy, MD;  Location: [facility];  Service: Cardiovascular;  Laterality: Right;   LOWER EXTREMITY ANGIOGRAPHY Right 12/18/2018   Procedure: Lower Extremity Angiography;  Surgeon: Annice Needy, MD;  Location: [facility];   Service: Cardiovascular;  Laterality: Right;   LOWER EXTREMITY ANGIOGRAPHY Right 12/18/2018   Procedure: Lower Extremity Angiography;  Surgeon: Annice Needy, MD;  Location: [facility];  Service: Cardiovascular;  Laterality: Right;   LOWER EXTREMITY ANGIOGRAPHY Left 12/21/2018   Procedure: Lower Extremity Angiography;  Surgeon: Annice Needy, MD;  Location: [facility];  Service: Cardiovascular;  Laterality: Left;   LOWER EXTREMITY ANGIOGRAPHY Right 12/21/2018   Procedure: Lower Extremity Angiography;  Surgeon: Annice Needy, MD;  Location: [facility];  Service: Cardiovascular;  Laterality: Right;   LOWER EXTREMITY INTERVENTION N/A 12/22/2018   Procedure: LOWER EXTREMITY INTERVENTION;  Surgeon: Annice Needy, MD;  Location: [facility];  Service: Cardiovascular;  Laterality: N/A;    Family History  Problem Relation Age of  Onset   Breast cancer Mother 57    Allergies  Allergen Reactions   Metformin And Related Diarrhea   Penicillins Hives    Has patient had a PCN reaction causing immediate rash, facial/tongue/throat swelling, SOB or lightheadedness with hypotension: Yes Has patient had a PCN reaction causing severe rash involving mucus membranes or skin necrosis: No Has patient had a PCN reaction that required hospitalization: No Has patient had a PCN reaction occurring within the last 10 years: No If all of the above answers are "NO", then may proceed with Cephalosporin use.   Tramadol Itching    CBC Latest Ref Rng & Units 11/26/2020 05/28/2020 04/24/2020  WBC 4.0 - 10.5 K/uL 8.6 8.8 4.3  Hemoglobin 12.0 - 15.0 g/dL 11.1(L) 11.1(L) 11.8(L)  Hematocrit 36.0 - 46.0 % 32.0(L) 31.1(L) 34.8(L)  Platelets 150 - 400 K/uL 332 340 218      CMP     Component Value Date/Time   NA 135 11/26/2020 1115   K 3.7 11/26/2020 1115   CL 105 11/26/2020 1115   CO2 21 (L) 11/26/2020 1115   GLUCOSE 208 (H) 11/26/2020 1115   BUN 15 11/26/2020 1115   CREATININE 1.10 (H)  11/26/2020 1115   CALCIUM 9.7 11/26/2020 1115   PROT 7.8 11/26/2020 1115   ALBUMIN 4.2 11/26/2020 1115   AST 16 11/26/2020 1115   ALT 14 11/26/2020 1115   ALKPHOS 83 11/26/2020 1115   BILITOT 0.9 11/26/2020 1115   GFRNONAA 56 (L) 11/26/2020 1115   GFRAA >60 11/23/2019 1505     No results found.     Assessment & Plan:   1. PAD (peripheral artery disease) (HCC) Today noninvasive studies are actually improved compared to her follow-up 6 months ago.  I suspect that the worsening pain in her left lower extremity may be due to her issues with her prosthetic.  Previously she was needing for socks to fit chest to wear her prosthetic.  This is likely altered her gait and caused her to put more weight on her left lower extremity.  We will keep a closer follow-up for the time being and have the patient follow-up in 6 months with noninvasive studies.  2. Primary hypertension Continue antihypertensive medications as already ordered, these medications have been reviewed and there are no changes at this time.   3. Non-insulin dependent type 2 diabetes mellitus (HCC) Continue hypoglycemic medications as already ordered, these medications have been reviewed and there are no changes at this time.  Hgb A1C to be monitored as already arranged by primary service    Current Outpatient Medications on File Prior to Visit  Medication Sig Dispense Refill   acetaminophen (TYLENOL) 325 MG tablet Take 650 mg by mouth every 6 (six) hours as needed.     albuterol (PROVENTIL HFA;VENTOLIN HFA) 108 (90 Base) MCG/ACT inhaler Inhale 2 puffs into the lungs every 6 (six) hours as needed for wheezing or shortness of breath. 1 Inhaler 2   apixaban (ELIQUIS) 2.5 MG TABS tablet Take 1 tablet (2.5 mg total) by mouth 2 (two) times daily. 60 tablet 4   atorvastatin (LIPITOR) 10 MG tablet Take 1 tablet by mouth daily.     cetirizine (ZYRTEC) 10 MG tablet Take 10 mg by mouth daily.     docusate sodium (COLACE) 100 MG capsule  Take 100 mg by mouth 2 (two) times daily.     DULoxetine (CYMBALTA) 30 MG capsule Take 30 mg by mouth daily.     ferrous sulfate 325 (65  FE) MG tablet Take 325 mg by mouth daily.      folic acid (FOLVITE) 1 MG tablet Take 1 mg by mouth daily.     gabapentin (NEURONTIN) 300 MG capsule Take 2 Capsules (600 mg) at Bedtime and 1 capsule (300 mg) in the afternoon 90 capsule 2   JARDIANCE 10 MG TABS tablet Take 10 mg by mouth daily.     latanoprost (XALATAN) 0.005 % ophthalmic solution 1 drop at bedtime.     lisinopril (ZESTRIL) 10 MG tablet Take 1 tablet (10 mg total) by mouth daily. 30 tablet 1   Melatonin 1 MG TABS Take 1 tablet by mouth at bedtime.     methotrexate (RHEUMATREX) 2.5 MG tablet Take 15 mg by mouth once a week.     moxifloxacin (VIGAMOX) 0.5 % ophthalmic solution 1 drop 3 (three) times daily.     Multiple Vitamins-Minerals (WOMENS MULTIVITAMIN PO) Take 1 tablet by mouth daily.     prednisoLONE acetate (PRED FORTE) 1 % ophthalmic suspension INSTILL 1 DROP 4 TIMES DAILY AS DIRECTED. BEGINNING AFTER SURGERY     rosuvastatin (CRESTOR) 5 MG tablet Take 1 tablet (5 mg total) by mouth daily at 6 PM. 30 tablet 1   sitaGLIPtin (JANUVIA) 100 MG tablet Take 100 mg by mouth daily.     vitamin B-12 (CYANOCOBALAMIN) 500 MCG tablet Take 1 tablet (500 mcg total) by mouth daily. 90 tablet 1   famotidine (PEPCID) 20 MG tablet Take 1 tablet (20 mg total) by mouth 2 (two) times daily. 60 tablet 1   ketorolac (ACULAR) 0.4 % SOLN INSTILL 1 DROP INTO AFFECTED EYE 4 TIMES DAILY. BEGINNING AFTER SURGERY.     Na Sulfate-K Sulfate-Mg Sulf 17.5-3.13-1.6 GM/177ML SOLN At 5 PM the day before procedure take 1 bottle and 5 hours before procedure take 1 bottle. (Patient not taking: No sig reported) 708 mL 0   No current facility-administered medications on file prior to visit.    There are no Patient Instructions on file for this visit. No follow-ups on file.   Georgiana Spinner, NP

## 2020-12-24 ENCOUNTER — Encounter (INDEPENDENT_AMBULATORY_CARE_PROVIDER_SITE_OTHER): Payer: Self-pay

## 2020-12-24 ENCOUNTER — Ambulatory Visit (INDEPENDENT_AMBULATORY_CARE_PROVIDER_SITE_OTHER): Payer: Medicare Other | Admitting: Nurse Practitioner

## 2020-12-24 ENCOUNTER — Ambulatory Visit (INDEPENDENT_AMBULATORY_CARE_PROVIDER_SITE_OTHER): Payer: Medicare Other

## 2020-12-24 ENCOUNTER — Other Ambulatory Visit: Payer: Self-pay

## 2020-12-24 ENCOUNTER — Encounter (INDEPENDENT_AMBULATORY_CARE_PROVIDER_SITE_OTHER): Payer: Self-pay | Admitting: Nurse Practitioner

## 2020-12-24 ENCOUNTER — Other Ambulatory Visit (INDEPENDENT_AMBULATORY_CARE_PROVIDER_SITE_OTHER): Payer: Self-pay | Admitting: Nurse Practitioner

## 2020-12-24 ENCOUNTER — Telehealth (INDEPENDENT_AMBULATORY_CARE_PROVIDER_SITE_OTHER): Payer: Self-pay | Admitting: Vascular Surgery

## 2020-12-24 VITALS — BP 138/75 | HR 99 | Resp 16 | Wt 142.6 lb

## 2020-12-24 DIAGNOSIS — R209 Unspecified disturbances of skin sensation: Secondary | ICD-10-CM

## 2020-12-24 DIAGNOSIS — F17200 Nicotine dependence, unspecified, uncomplicated: Secondary | ICD-10-CM | POA: Diagnosis not present

## 2020-12-24 DIAGNOSIS — E785 Hyperlipidemia, unspecified: Secondary | ICD-10-CM

## 2020-12-24 DIAGNOSIS — Z9889 Other specified postprocedural states: Secondary | ICD-10-CM

## 2020-12-24 DIAGNOSIS — I10 Essential (primary) hypertension: Secondary | ICD-10-CM | POA: Diagnosis not present

## 2020-12-24 DIAGNOSIS — I739 Peripheral vascular disease, unspecified: Secondary | ICD-10-CM

## 2020-12-24 DIAGNOSIS — F17210 Nicotine dependence, cigarettes, uncomplicated: Secondary | ICD-10-CM | POA: Diagnosis not present

## 2020-12-24 DIAGNOSIS — Z89511 Acquired absence of right leg below knee: Secondary | ICD-10-CM | POA: Diagnosis not present

## 2020-12-24 MED ORDER — OXYCODONE HCL 5 MG PO TABS: 5.0000 mg | ORAL_TABLET | ORAL | 0 refills | Status: DC | PRN

## 2020-12-24 NOTE — H&P (View-Only) (Signed)
Subjective:    Patient ID: Sylvia [name], female    DOB: 12-Nov-1955, 65 y.o.   MRN: [id number] Chief Complaint  Patient presents with   Follow-up    Add on cold foot    Sylvia [name] is a 65 year old female that presents today for sensation of a cold painful left lower extremity.  The patient notes that her symptoms began on Sunday.  She notes that she is having severe pain making it difficult for her to walk on her leg.  She also has significant rest pain during the evening.  Her foot feels cold although she still reserves some motor function.  She denies any new wounds or ulcerations.  The patient does have a previous history of a right below-knee amputation.  The patient was seen in office on 12/10/2020 and her studies were stable at that time.  Today noninvasive studies show an occluded SFA at the origin, at the location of previously placed stents.  The patient has dampened monophasic flow from the distal popliteal through her tibials.   Review of Systems  Cardiovascular:        Rest pain  All other systems reviewed and are negative.     Objective:   Physical Exam Vitals reviewed.  HENT:     Head: Normocephalic.  Cardiovascular:     Rate and Rhythm: Normal rate.     Pulses:          Dorsalis pedis pulses are 0 on the left side.       Posterior tibial pulses are 0 on the left side.  Pulmonary:     Effort: Pulmonary effort is normal.  Musculoskeletal:     Right Lower Extremity: Right leg is amputated below knee.  Skin:    General: Skin is cool.  Neurological:     Mental Status: She is alert and oriented to person, place, and time.  Psychiatric:        Mood and Affect: Mood normal.        Behavior: Behavior normal.        Thought Content: Thought content normal.        Judgment: Judgment normal.    BP 138/75 (BP Location: Right Arm)   Pulse 99   Resp 16   Wt 142 lb 9.6 oz (64.7 kg)   BMI 23.02 kg/m   Past Medical History:  Diagnosis Date   Anemia of  chronic disease 05/16/2019   Anxiety    h/o   COPD (chronic obstructive pulmonary disease) (HCC)    Diabetes mellitus without complication (HCC)    GERD (gastroesophageal reflux disease)    Hypertension    bp under control-off meds since 2019    Social History   Socioeconomic History   Marital status: Divorced    Spouse name: Not on file   Number of children: Not on file   Years of education: Not on file   Highest education level: Not on file  Occupational History   Not on file  Tobacco Use   Smoking status: Some Days    Packs/day: 0.25    Years: 45.00    Pack years: 11.25    Types: Cigarettes   Smokeless tobacco: Never   Tobacco comments:    quit  Vaping Use   Vaping Use: Never used  Substance and Sexual Activity   Alcohol use: No   Drug use: Not Currently    Comment: last used in April 2021 per patient   Sexual activity: Not Currently  Other Topics Concern   Not on file  Social History Narrative   Not on file   Social Determinants of Health   Financial Resource Strain: Not on file  Food Insecurity: Not on file  Transportation Needs: Not on file  Physical Activity: Not on file  Stress: Not on file  Social Connections: Not on file  Intimate Partner Violence: Not on file    Past Surgical History:  Procedure Laterality Date   ABDOMINAL HYSTERECTOMY     AMPUTATION Right 12/27/2018   Procedure: AMPUTATION BELOW KNEE;  Surgeon: Annice Needy, MD;  Location: [facility];  Service: General;  Laterality: Right;   CATARACT EXTRACTION W/PHACO Right 03/27/2020   Procedure: CATARACT EXTRACTION PHACO AND INTRAOCULAR LENS PLACEMENT (IOC) RIGHT DIABETIC 7.54 00:52.5;  Surgeon: Galen Manila, MD;  Location: [facility];  Service: Ophthalmology;  Laterality: Right;   CATARACT EXTRACTION W/PHACO Left 05/20/2020   Procedure: CATARACT EXTRACTION PHACO AND INTRAOCULAR LENS PLACEMENT (IOC) LEFT DIABETIC 4.95 00:37.6;  Surgeon: Galen Manila, MD;  Location: [facility];  Service: Ophthalmology;  Laterality: Left;  Diabetic - oral meds COVID + 04-24-20   CHOLECYSTECTOMY     COLONOSCOPY WITH PROPOFOL N/A 04/16/2019   Procedure: COLONOSCOPY WITH PROPOFOL;  Surgeon: Pasty Spillers, MD;  Location: [facility];  Service: Endoscopy;  Laterality: N/A;   COLONOSCOPY WITH PROPOFOL N/A 01/16/2020   Procedure: COLONOSCOPY WITH PROPOFOL;  Surgeon: Pasty Spillers, MD;  Location: [facility];  Service: Endoscopy;  Laterality: N/A;   ESOPHAGOGASTRODUODENOSCOPY (EGD) WITH PROPOFOL N/A 06/15/2018   Procedure: ESOPHAGOGASTRODUODENOSCOPY (EGD) WITH PROPOFOL;  Surgeon: Toledo, Boykin Nearing, MD;  Location: [facility];  Service: Gastroenterology;  Laterality: N/A;   ESOPHAGOGASTRODUODENOSCOPY (EGD) WITH PROPOFOL N/A 01/03/2019   Procedure: ESOPHAGOGASTRODUODENOSCOPY (EGD) WITH PROPOFOL;  Surgeon: Midge Minium, MD;  Location: [facility];  Service: Endoscopy;  Laterality: N/A;   GIVENS CAPSULE STUDY  04/16/2019   Procedure: GIVENS CAPSULE STUDY;  Surgeon: Pasty Spillers, MD;  Location: [facility];  Service: Endoscopy;;   GIVENS CAPSULE STUDY N/A 06/25/2019   Procedure: GIVENS CAPSULE STUDY;  Surgeon: Wyline Mood, MD;  Location: [facility];  Service: Gastroenterology;  Laterality: N/A;   LOWER EXTREMITY ANGIOGRAPHY Right 08/21/2018   Procedure: LOWER EXTREMITY ANGIOGRAPHY;  Surgeon: Annice Needy, MD;  Location: [facility];  Service: Cardiovascular;  Laterality: Right;   LOWER EXTREMITY ANGIOGRAPHY Left 08/28/2018   Procedure: LOWER EXTREMITY ANGIOGRAPHY;  Surgeon: Annice Needy, MD;  Location: [facility];  Service: Cardiovascular;  Laterality: Left;   LOWER EXTREMITY ANGIOGRAPHY Right 08/28/2018   Procedure: Lower Extremity Angiography;  Surgeon: Annice Needy, MD;  Location: [facility];  Service: Cardiovascular;  Laterality: Right;   LOWER EXTREMITY ANGIOGRAPHY Right 12/18/2018   Procedure: Lower Extremity Angiography;   Surgeon: Annice Needy, MD;  Location: [facility];  Service: Cardiovascular;  Laterality: Right;   LOWER EXTREMITY ANGIOGRAPHY Right 12/18/2018   Procedure: Lower Extremity Angiography;  Surgeon: Annice Needy, MD;  Location: [facility];  Service: Cardiovascular;  Laterality: Right;   LOWER EXTREMITY ANGIOGRAPHY Left 12/21/2018   Procedure: Lower Extremity Angiography;  Surgeon: Annice Needy, MD;  Location: [facility];  Service: Cardiovascular;  Laterality: Left;   LOWER EXTREMITY ANGIOGRAPHY Right 12/21/2018   Procedure: Lower Extremity Angiography;  Surgeon: Annice Needy, MD;  Location: [facility];  Service: Cardiovascular;  Laterality: Right;   LOWER EXTREMITY INTERVENTION N/A 12/22/2018  Procedure: LOWER EXTREMITY INTERVENTION;  Surgeon: Dew, Jason S, MD;  Location: [facility];  Service: Cardiovascular;  Laterality: N/A;    Family History  Problem Relation Age of Onset   Breast cancer Mother 73    Allergies  Allergen Reactions   Metformin And Related Diarrhea   Penicillins Hives    Has patient had a PCN reaction causing immediate rash, facial/tongue/throat swelling, SOB or lightheadedness with hypotension: Yes Has patient had a PCN reaction causing severe rash involving mucus membranes or skin necrosis: No Has patient had a PCN reaction that required hospitalization: No Has patient had a PCN reaction occurring within the last 10 years: No If all of the above answers are "NO", then may proceed with Cephalosporin use.   Tramadol Itching    CBC Latest Ref Rng & Units 11/26/2020 05/28/2020 04/24/2020  WBC 4.0 - 10.5 K/uL 8.6 8.8 4.3  Hemoglobin 12.0 - 15.0 g/dL 11.1(L) 11.1(L) 11.8(L)  Hematocrit 36.0 - 46.0 % 32.0(L) 31.1(L) 34.8(L)  Platelets 150 - 400 K/uL 332 340 218      CMP     Component Value Date/Time   NA 135 11/26/2020 1115   K 3.7 11/26/2020 1115   CL 105 11/26/2020 1115   CO2 21 (L) 11/26/2020 1115   GLUCOSE 208 (H)  11/26/2020 1115   BUN 15 11/26/2020 1115   CREATININE 1.10 (H) 11/26/2020 1115   CALCIUM 9.7 11/26/2020 1115   PROT 7.8 11/26/2020 1115   ALBUMIN 4.2 11/26/2020 1115   AST 16 11/26/2020 1115   ALT 14 11/26/2020 1115   ALKPHOS 83 11/26/2020 1115   BILITOT 0.9 11/26/2020 1115   GFRNONAA 56 (L) 11/26/2020 1115   GFRAA >60 11/23/2019 1505     No results found.     Assessment & Plan:   1. Cold left foot Recommend:  The patient has evidence of severe atherosclerotic changes of the left lower extremities with rest pain that is associated with preulcerative changes and impending tissue loss of the foot.  This represents a limb threatening ischemia and places the patient at the risk for limb loss.  Patient should undergo angiography of the lower extremities with the hope for intervention for limb salvage.  The risks and benefits as well as the alternative therapies was discussed in detail with the patient.  All questions were answered.  Patient agrees to proceed with angiography.  The patient will follow up with me in the office after the procedure.       2. Essential hypertension Continue antihypertensive medications as already ordered, these medications have been reviewed and there are no changes at this time.   3. Hyperlipidemia, unspecified hyperlipidemia type Continue statin as ordered and reviewed, no changes at this time   4. Tobacco use disorder Smoking cessation was discussed, 3-10 minutes spent on this topic specifically    Current Outpatient Medications on File Prior to Visit  Medication Sig Dispense Refill   acetaminophen (TYLENOL) 325 MG tablet Take 650 mg by mouth every 6 (six) hours as needed.     albuterol (PROVENTIL HFA;VENTOLIN HFA) 108 (90 Base) MCG/ACT inhaler Inhale 2 puffs into the lungs every 6 (six) hours as needed for wheezing or shortness of breath. 1 Inhaler 2   apixaban (ELIQUIS) 2.5 MG TABS tablet Take 1 tablet (2.5 mg total) by mouth 2 (two) times  daily. 60 tablet 4   atorvastatin (LIPITOR) 10 MG tablet Take 1 tablet by mouth daily.     cetirizine (ZYRTEC) 10 MG   tablet Take 10 mg by mouth daily.     docusate sodium (COLACE) 100 MG capsule Take 100 mg by mouth 2 (two) times daily.     DULoxetine (CYMBALTA) 30 MG capsule Take 30 mg by mouth daily.     ferrous sulfate 325 (65 FE) MG tablet Take 325 mg by mouth daily.      folic acid (FOLVITE) 1 MG tablet Take 1 mg by mouth daily.     gabapentin (NEURONTIN) 300 MG capsule Take 2 Capsules (600 mg) at Bedtime and 1 capsule (300 mg) in the afternoon 90 capsule 2   JARDIANCE 10 MG TABS tablet Take 10 mg by mouth daily.     latanoprost (XALATAN) 0.005 % ophthalmic solution 1 drop at bedtime.     lisinopril (ZESTRIL) 10 MG tablet Take 1 tablet (10 mg total) by mouth daily. 30 tablet 1   Melatonin 1 MG TABS Take 1 tablet by mouth at bedtime.     methotrexate (RHEUMATREX) 2.5 MG tablet Take 15 mg by mouth once a week.     moxifloxacin (VIGAMOX) 0.5 % ophthalmic solution 1 drop 3 (three) times daily.     Multiple Vitamins-Minerals (WOMENS MULTIVITAMIN PO) Take 1 tablet by mouth daily.     prednisoLONE acetate (PRED FORTE) 1 % ophthalmic suspension INSTILL 1 DROP 4 TIMES DAILY AS DIRECTED. BEGINNING AFTER SURGERY     rosuvastatin (CRESTOR) 5 MG tablet Take 1 tablet (5 mg total) by mouth daily at 6 PM. 30 tablet 1   sitaGLIPtin (JANUVIA) 100 MG tablet Take 100 mg by mouth daily.     vitamin B-12 (CYANOCOBALAMIN) 500 MCG tablet Take 1 tablet (500 mcg total) by mouth daily. 90 tablet 1   famotidine (PEPCID) 20 MG tablet Take 1 tablet (20 mg total) by mouth 2 (two) times daily. 60 tablet 1   ketorolac (ACULAR) 0.4 % SOLN INSTILL 1 DROP INTO AFFECTED EYE 4 TIMES DAILY. BEGINNING AFTER SURGERY.     Na Sulfate-K Sulfate-Mg Sulf 17.5-3.13-1.6 GM/177ML SOLN At 5 PM the day before procedure take 1 bottle and 5 hours before procedure take 1 bottle. (Patient not taking: No sig reported) 708 mL 0   No current  facility-administered medications on file prior to visit.    There are no Patient Instructions on file for this visit. No follow-ups on file.   Georgiana Spinner, NP

## 2020-12-24 NOTE — Telephone Encounter (Signed)
Called stating that she is experiencing numbness in her toes and foot, she also mentioned that her toes/foot is cold. Patient states she went to ED but left when they told her it would be an 8hr+ wait. I spoke briefly to FB and she would like patient to come in with abi's. I called and scheduled patient.   This note is for documentation purposes only.

## 2020-12-24 NOTE — Progress Notes (Signed)
Subjective:    Patient ID: Sylvia [name], female    DOB: 12-Nov-1955, 65 y.o.   MRN: [id number] Chief Complaint  Patient presents with   Follow-up    Add on cold foot    Sylvia [name] is a 65 year old female that presents today for sensation of a cold painful left lower extremity.  The patient notes that her symptoms began on Sunday.  She notes that she is having severe pain making it difficult for her to walk on her leg.  She also has significant rest pain during the evening.  Her foot feels cold although she still reserves some motor function.  She denies any new wounds or ulcerations.  The patient does have a previous history of a right below-knee amputation.  The patient was seen in office on 12/10/2020 and her studies were stable at that time.  Today noninvasive studies show an occluded SFA at the origin, at the location of previously placed stents.  The patient has dampened monophasic flow from the distal popliteal through her tibials.   Review of Systems  Cardiovascular:        Rest pain  All other systems reviewed and are negative.     Objective:   Physical Exam Vitals reviewed.  HENT:     Head: Normocephalic.  Cardiovascular:     Rate and Rhythm: Normal rate.     Pulses:          Dorsalis pedis pulses are 0 on the left side.       Posterior tibial pulses are 0 on the left side.  Pulmonary:     Effort: Pulmonary effort is normal.  Musculoskeletal:     Right Lower Extremity: Right leg is amputated below knee.  Skin:    General: Skin is cool.  Neurological:     Mental Status: She is alert and oriented to person, place, and time.  Psychiatric:        Mood and Affect: Mood normal.        Behavior: Behavior normal.        Thought Content: Thought content normal.        Judgment: Judgment normal.    BP 138/75 (BP Location: Right Arm)   Pulse 99   Resp 16   Wt 142 lb 9.6 oz (64.7 kg)   BMI 23.02 kg/m   Past Medical History:  Diagnosis Date   Anemia of  chronic disease 05/16/2019   Anxiety    h/o   COPD (chronic obstructive pulmonary disease) (HCC)    Diabetes mellitus without complication (HCC)    GERD (gastroesophageal reflux disease)    Hypertension    bp under control-off meds since 2019    Social History   Socioeconomic History   Marital status: Divorced    Spouse name: Not on file   Number of children: Not on file   Years of education: Not on file   Highest education level: Not on file  Occupational History   Not on file  Tobacco Use   Smoking status: Some Days    Packs/day: 0.25    Years: 45.00    Pack years: 11.25    Types: Cigarettes   Smokeless tobacco: Never   Tobacco comments:    quit  Vaping Use   Vaping Use: Never used  Substance and Sexual Activity   Alcohol use: No   Drug use: Not Currently    Comment: last used in April 2021 per patient   Sexual activity: Not Currently  Other Topics Concern   Not on file  Social History Narrative   Not on file   Social Determinants of Health   Financial Resource Strain: Not on file  Food Insecurity: Not on file  Transportation Needs: Not on file  Physical Activity: Not on file  Stress: Not on file  Social Connections: Not on file  Intimate Partner Violence: Not on file    Past Surgical History:  Procedure Laterality Date   ABDOMINAL HYSTERECTOMY     AMPUTATION Right 12/27/2018   Procedure: AMPUTATION BELOW KNEE;  Surgeon: Annice Needy, MD;  Location: [facility];  Service: General;  Laterality: Right;   CATARACT EXTRACTION W/PHACO Right 03/27/2020   Procedure: CATARACT EXTRACTION PHACO AND INTRAOCULAR LENS PLACEMENT (IOC) RIGHT DIABETIC 7.54 00:52.5;  Surgeon: Galen Manila, MD;  Location: [facility];  Service: Ophthalmology;  Laterality: Right;   CATARACT EXTRACTION W/PHACO Left 05/20/2020   Procedure: CATARACT EXTRACTION PHACO AND INTRAOCULAR LENS PLACEMENT (IOC) LEFT DIABETIC 4.95 00:37.6;  Surgeon: Galen Manila, MD;  Location: [facility];  Service: Ophthalmology;  Laterality: Left;  Diabetic - oral meds COVID + 04-24-20   CHOLECYSTECTOMY     COLONOSCOPY WITH PROPOFOL N/A 04/16/2019   Procedure: COLONOSCOPY WITH PROPOFOL;  Surgeon: Pasty Spillers, MD;  Location: [facility];  Service: Endoscopy;  Laterality: N/A;   COLONOSCOPY WITH PROPOFOL N/A 01/16/2020   Procedure: COLONOSCOPY WITH PROPOFOL;  Surgeon: Pasty Spillers, MD;  Location: [facility];  Service: Endoscopy;  Laterality: N/A;   ESOPHAGOGASTRODUODENOSCOPY (EGD) WITH PROPOFOL N/A 06/15/2018   Procedure: ESOPHAGOGASTRODUODENOSCOPY (EGD) WITH PROPOFOL;  Surgeon: Toledo, Boykin Nearing, MD;  Location: [facility] END[facility];  Service: Gastroenterology;  Laterality: N/A;   ESOPHAGOGASTRODUODENOSCOPY (EGD) WITH PROPOFOL N/A 01/03/2019   Procedure: ESOPHAGOGASTRODUODENOSCOPY (EGD) WITH PROPOFOL;  Surgeon: Midge Minium, MD;  Location: [facility];  Service: Endoscopy;  Laterality: N/A;   GIVENS CAPSULE STUDY  04/16/2019   Procedure: GIVENS CAPSULE STUDY;  Surgeon: Pasty Spillers, MD;  Location: [facility];  Service: Endoscopy;;   GIVENS CAPSULE STUDY N/A 06/25/2019   Procedure: GIVENS CAPSULE STUDY;  Surgeon: Wyline Mood, MD;  Location: [facility];  Service: Gastroenterology;  Laterality: N/A;   LOWER EXTREMITY ANGIOGRAPHY Right 08/21/2018   Procedure: LOWER EXTREMITY ANGIOGRAPHY;  Surgeon: Annice Needy, MD;  Location: [facility];  Service: Cardiovascular;  Laterality: Right;   LOWER EXTREMITY ANGIOGRAPHY Left 08/28/2018   Procedure: LOWER EXTREMITY ANGIOGRAPHY;  Surgeon: Annice Needy, MD;  Location: [facility];  Service: Cardiovascular;  Laterality: Left;   LOWER EXTREMITY ANGIOGRAPHY Right 08/28/2018   Procedure: Lower Extremity Angiography;  Surgeon: Annice Needy, MD;  Location: [facility];  Service: Cardiovascular;  Laterality: Right;   LOWER EXTREMITY ANGIOGRAPHY Right 12/18/2018   Procedure: Lower Extremity Angiography;   Surgeon: Annice Needy, MD;  Location: [facility];  Service: Cardiovascular;  Laterality: Right;   LOWER EXTREMITY ANGIOGRAPHY Right 12/18/2018   Procedure: Lower Extremity Angiography;  Surgeon: Annice Needy, MD;  Location: [facility];  Service: Cardiovascular;  Laterality: Right;   LOWER EXTREMITY ANGIOGRAPHY Left 12/21/2018   Procedure: Lower Extremity Angiography;  Surgeon: Annice Needy, MD;  Location: [facility];  Service: Cardiovascular;  Laterality: Left;   LOWER EXTREMITY ANGIOGRAPHY Right 12/21/2018   Procedure: Lower Extremity Angiography;  Surgeon: Annice Needy, MD;  Location: [facility];  Service: Cardiovascular;  Laterality: Right;   LOWER EXTREMITY INTERVENTION N/A 12/22/2018  Procedure: LOWER EXTREMITY INTERVENTION;  Surgeon: Annice Needy, MD;  Location: [facility];  Service: Cardiovascular;  Laterality: N/A;    Family History  Problem Relation Age of Onset   Breast cancer Mother 16    Allergies  Allergen Reactions   Metformin And Related Diarrhea   Penicillins Hives    Has patient had a PCN reaction causing immediate rash, facial/tongue/throat swelling, SOB or lightheadedness with hypotension: Yes Has patient had a PCN reaction causing severe rash involving mucus membranes or skin necrosis: No Has patient had a PCN reaction that required hospitalization: No Has patient had a PCN reaction occurring within the last 10 years: No If all of the above answers are "NO", then may proceed with Cephalosporin use.   Tramadol Itching    CBC Latest Ref Rng & Units 11/26/2020 05/28/2020 04/24/2020  WBC 4.0 - 10.5 K/uL 8.6 8.8 4.3  Hemoglobin 12.0 - 15.0 g/dL 11.1(L) 11.1(L) 11.8(L)  Hematocrit 36.0 - 46.0 % 32.0(L) 31.1(L) 34.8(L)  Platelets 150 - 400 K/uL 332 340 218      CMP     Component Value Date/Time   NA 135 11/26/2020 1115   K 3.7 11/26/2020 1115   CL 105 11/26/2020 1115   CO2 21 (L) 11/26/2020 1115   GLUCOSE 208 (H)  11/26/2020 1115   BUN 15 11/26/2020 1115   CREATININE 1.10 (H) 11/26/2020 1115   CALCIUM 9.7 11/26/2020 1115   PROT 7.8 11/26/2020 1115   ALBUMIN 4.2 11/26/2020 1115   AST 16 11/26/2020 1115   ALT 14 11/26/2020 1115   ALKPHOS 83 11/26/2020 1115   BILITOT 0.9 11/26/2020 1115   GFRNONAA 56 (L) 11/26/2020 1115   GFRAA >60 11/23/2019 1505     No results found.     Assessment & Plan:   1. Cold left foot Recommend:  The patient has evidence of severe atherosclerotic changes of the left lower extremities with rest pain that is associated with preulcerative changes and impending tissue loss of the foot.  This represents a limb threatening ischemia and places the patient at the risk for limb loss.  Patient should undergo angiography of the lower extremities with the hope for intervention for limb salvage.  The risks and benefits as well as the alternative therapies was discussed in detail with the patient.  All questions were answered.  Patient agrees to proceed with angiography.  The patient will follow up with me in the office after the procedure.       2. Essential hypertension Continue antihypertensive medications as already ordered, these medications have been reviewed and there are no changes at this time.   3. Hyperlipidemia, unspecified hyperlipidemia type Continue statin as ordered and reviewed, no changes at this time   4. Tobacco use disorder Smoking cessation was discussed, 3-10 minutes spent on this topic specifically    Current Outpatient Medications on File Prior to Visit  Medication Sig Dispense Refill   acetaminophen (TYLENOL) 325 MG tablet Take 650 mg by mouth every 6 (six) hours as needed.     albuterol (PROVENTIL HFA;VENTOLIN HFA) 108 (90 Base) MCG/ACT inhaler Inhale 2 puffs into the lungs every 6 (six) hours as needed for wheezing or shortness of breath. 1 Inhaler 2   apixaban (ELIQUIS) 2.5 MG TABS tablet Take 1 tablet (2.5 mg total) by mouth 2 (two) times  daily. 60 tablet 4   atorvastatin (LIPITOR) 10 MG tablet Take 1 tablet by mouth daily.     cetirizine (ZYRTEC) 10 MG  tablet Take 10 mg by mouth daily.     docusate sodium (COLACE) 100 MG capsule Take 100 mg by mouth 2 (two) times daily.     DULoxetine (CYMBALTA) 30 MG capsule Take 30 mg by mouth daily.     ferrous sulfate 325 (65 FE) MG tablet Take 325 mg by mouth daily.      folic acid (FOLVITE) 1 MG tablet Take 1 mg by mouth daily.     gabapentin (NEURONTIN) 300 MG capsule Take 2 Capsules (600 mg) at Bedtime and 1 capsule (300 mg) in the afternoon 90 capsule 2   JARDIANCE 10 MG TABS tablet Take 10 mg by mouth daily.     latanoprost (XALATAN) 0.005 % ophthalmic solution 1 drop at bedtime.     lisinopril (ZESTRIL) 10 MG tablet Take 1 tablet (10 mg total) by mouth daily. 30 tablet 1   Melatonin 1 MG TABS Take 1 tablet by mouth at bedtime.     methotrexate (RHEUMATREX) 2.5 MG tablet Take 15 mg by mouth once a week.     moxifloxacin (VIGAMOX) 0.5 % ophthalmic solution 1 drop 3 (three) times daily.     Multiple Vitamins-Minerals (WOMENS MULTIVITAMIN PO) Take 1 tablet by mouth daily.     prednisoLONE acetate (PRED FORTE) 1 % ophthalmic suspension INSTILL 1 DROP 4 TIMES DAILY AS DIRECTED. BEGINNING AFTER SURGERY     rosuvastatin (CRESTOR) 5 MG tablet Take 1 tablet (5 mg total) by mouth daily at 6 PM. 30 tablet 1   sitaGLIPtin (JANUVIA) 100 MG tablet Take 100 mg by mouth daily.     vitamin B-12 (CYANOCOBALAMIN) 500 MCG tablet Take 1 tablet (500 mcg total) by mouth daily. 90 tablet 1   famotidine (PEPCID) 20 MG tablet Take 1 tablet (20 mg total) by mouth 2 (two) times daily. 60 tablet 1   ketorolac (ACULAR) 0.4 % SOLN INSTILL 1 DROP INTO AFFECTED EYE 4 TIMES DAILY. BEGINNING AFTER SURGERY.     Na Sulfate-K Sulfate-Mg Sulf 17.5-3.13-1.6 GM/177ML SOLN At 5 PM the day before procedure take 1 bottle and 5 hours before procedure take 1 bottle. (Patient not taking: No sig reported) 708 mL 0   No current  facility-administered medications on file prior to visit.    There are no Patient Instructions on file for this visit. No follow-ups on file.   Georgiana Spinner, NP

## 2020-12-25 ENCOUNTER — Inpatient Hospital Stay
Admission: AD | Admit: 2020-12-25 | Discharge: 2020-12-30 | DRG: 272 | Disposition: A | Discharge status: Home Health | Payer: Medicare Other | Attending: Vascular Surgery | Admitting: Vascular Surgery

## 2020-12-25 ENCOUNTER — Encounter: Payer: Self-pay | Admitting: Vascular Surgery

## 2020-12-25 ENCOUNTER — Encounter
Admission: AD | Disposition: A | Discharge status: Home Health | Payer: Self-pay | Source: Home / Self Care | Attending: Vascular Surgery

## 2020-12-25 ENCOUNTER — Other Ambulatory Visit (INDEPENDENT_AMBULATORY_CARE_PROVIDER_SITE_OTHER): Payer: Self-pay | Admitting: Nurse Practitioner

## 2020-12-25 ENCOUNTER — Other Ambulatory Visit: Payer: Self-pay

## 2020-12-25 DIAGNOSIS — Z9282 Status post administration of tPA (rtPA) in a different facility within the last 24 hours prior to admission to current facility: Secondary | ICD-10-CM | POA: Diagnosis not present

## 2020-12-25 DIAGNOSIS — Z961 Presence of intraocular lens: Secondary | ICD-10-CM | POA: Diagnosis present

## 2020-12-25 DIAGNOSIS — J449 Chronic obstructive pulmonary disease, unspecified: Secondary | ICD-10-CM | POA: Diagnosis present

## 2020-12-25 DIAGNOSIS — Z7901 Long term (current) use of anticoagulants: Secondary | ICD-10-CM

## 2020-12-25 DIAGNOSIS — I10 Essential (primary) hypertension: Secondary | ICD-10-CM | POA: Diagnosis present

## 2020-12-25 DIAGNOSIS — I70222 Atherosclerosis of native arteries of extremities with rest pain, left leg: Secondary | ICD-10-CM | POA: Diagnosis present

## 2020-12-25 DIAGNOSIS — Z20822 Contact with and (suspected) exposure to covid-19: Secondary | ICD-10-CM | POA: Diagnosis present

## 2020-12-25 DIAGNOSIS — Z9842 Cataract extraction status, left eye: Secondary | ICD-10-CM | POA: Diagnosis not present

## 2020-12-25 DIAGNOSIS — Z79890 Hormone replacement therapy (postmenopausal): Secondary | ICD-10-CM | POA: Diagnosis not present

## 2020-12-25 DIAGNOSIS — F17210 Nicotine dependence, cigarettes, uncomplicated: Secondary | ICD-10-CM | POA: Diagnosis present

## 2020-12-25 DIAGNOSIS — K219 Gastro-esophageal reflux disease without esophagitis: Secondary | ICD-10-CM | POA: Diagnosis present

## 2020-12-25 DIAGNOSIS — Z7984 Long term (current) use of oral hypoglycemic drugs: Secondary | ICD-10-CM | POA: Diagnosis not present

## 2020-12-25 DIAGNOSIS — Z95820 Peripheral vascular angioplasty status with implants and grafts: Secondary | ICD-10-CM | POA: Diagnosis not present

## 2020-12-25 DIAGNOSIS — Z888 Allergy status to other drugs, medicaments and biological substances status: Secondary | ICD-10-CM

## 2020-12-25 DIAGNOSIS — Z89511 Acquired absence of right leg below knee: Secondary | ICD-10-CM | POA: Diagnosis not present

## 2020-12-25 DIAGNOSIS — E785 Hyperlipidemia, unspecified: Secondary | ICD-10-CM | POA: Diagnosis present

## 2020-12-25 DIAGNOSIS — I739 Peripheral vascular disease, unspecified: Secondary | ICD-10-CM

## 2020-12-25 DIAGNOSIS — Z803 Family history of malignant neoplasm of breast: Secondary | ICD-10-CM | POA: Diagnosis not present

## 2020-12-25 DIAGNOSIS — Z9049 Acquired absence of other specified parts of digestive tract: Secondary | ICD-10-CM

## 2020-12-25 DIAGNOSIS — Z885 Allergy status to narcotic agent status: Secondary | ICD-10-CM | POA: Diagnosis not present

## 2020-12-25 DIAGNOSIS — Z880 Allergy status to penicillin: Secondary | ICD-10-CM

## 2020-12-25 DIAGNOSIS — Z8616 Personal history of COVID-19: Secondary | ICD-10-CM | POA: Diagnosis not present

## 2020-12-25 DIAGNOSIS — Z90710 Acquired absence of both cervix and uterus: Secondary | ICD-10-CM | POA: Diagnosis not present

## 2020-12-25 DIAGNOSIS — Z79899 Other long term (current) drug therapy: Secondary | ICD-10-CM | POA: Diagnosis not present

## 2020-12-25 DIAGNOSIS — E1151 Type 2 diabetes mellitus with diabetic peripheral angiopathy without gangrene: Principal | ICD-10-CM | POA: Diagnosis present

## 2020-12-25 DIAGNOSIS — Z9841 Cataract extraction status, right eye: Secondary | ICD-10-CM

## 2020-12-25 DIAGNOSIS — F419 Anxiety disorder, unspecified: Secondary | ICD-10-CM | POA: Diagnosis present

## 2020-12-25 DIAGNOSIS — I998 Other disorder of circulatory system: Secondary | ICD-10-CM | POA: Diagnosis present

## 2020-12-25 HISTORY — PX: LOWER EXTREMITY ANGIOGRAPHY: CATH118251

## 2020-12-25 LAB — COMPREHENSIVE METABOLIC PANEL
ALT: 14 U/L (ref 0–44)
AST: 21 U/L (ref 15–41)
Albumin: 3.9 g/dL (ref 3.5–5.0)
Alkaline Phosphatase: 92 U/L (ref 38–126)
Anion gap: 9 (ref 5–15)
BUN: 15 mg/dL (ref 8–23)
CO2: 23 mmol/L (ref 22–32)
Calcium: 9.2 mg/dL (ref 8.9–10.3)
Chloride: 107 mmol/L (ref 98–111)
Creatinine, Ser: 0.93 mg/dL (ref 0.44–1.00)
GFR, Estimated: >60 mL/min (ref >60)
Glucose, Bld: 232 mg/dL — ABNORMAL HIGH (ref 70–99)
Potassium: 3.8 mmol/L (ref 3.5–5.1)
Sodium: 139 mmol/L (ref 135–145)
Total Bilirubin: 0.8 mg/dL (ref 0.3–1.2)
Total Protein: 7.4 g/dL (ref 6.5–8.1)

## 2020-12-25 LAB — URINALYSIS, ROUTINE W REFLEX MICROSCOPIC
Bilirubin Urine: NEGATIVE
Glucose, UA: >1000 mg/dL — AB
Hgb urine dipstick: NEGATIVE
Ketones, ur: NEGATIVE mg/dL
Leukocytes,Ua: NEGATIVE
Nitrite: NEGATIVE
Protein, ur: NEGATIVE mg/dL
Specific Gravity, Urine: <1.005 — ABNORMAL LOW (ref 1.005–1.030)
pH: 5 (ref 5.0–8.0)

## 2020-12-25 LAB — PROTIME-INR
INR: 1.1 (ref 0.8–1.2)
Prothrombin Time: 14.6 seconds (ref 11.4–15.2)

## 2020-12-25 LAB — CREATININE, SERUM
Creatinine, Ser: 1.02 mg/dL — ABNORMAL HIGH (ref 0.44–1.00)
GFR, Estimated: >60 mL/min (ref >60)

## 2020-12-25 LAB — CBC
HCT: 31.6 % — ABNORMAL LOW (ref 36.0–46.0)
Hemoglobin: 11.4 g/dL — ABNORMAL LOW (ref 12.0–15.0)
MCH: 32 pg (ref 26.0–34.0)
MCHC: 36.1 g/dL — ABNORMAL HIGH (ref 30.0–36.0)
MCV: 88.8 fL (ref 80.0–100.0)
Platelets: 225 10*3/uL (ref 150–400)
RBC: 3.56 MIL/uL — ABNORMAL LOW (ref 3.87–5.11)
RDW: 13.6 % (ref 11.5–15.5)
WBC: 20.1 10*3/uL — ABNORMAL HIGH (ref 4.0–10.5)
nRBC: 0 % (ref 0.0–0.2)

## 2020-12-25 LAB — HIV ANTIBODY (ROUTINE TESTING W REFLEX): HIV Screen 4th Generation wRfx: NONREACTIVE

## 2020-12-25 LAB — BUN: BUN: 16 mg/dL (ref 8–23)

## 2020-12-25 LAB — GLUCOSE, CAPILLARY
Glucose-Capillary: 178 mg/dL — ABNORMAL HIGH (ref 70–99)
Glucose-Capillary: 168 mg/dL — ABNORMAL HIGH (ref 70–99)
Glucose-Capillary: 223 mg/dL — ABNORMAL HIGH (ref 70–99)

## 2020-12-25 LAB — MRSA NEXT GEN BY PCR, NASAL: MRSA by PCR Next Gen: NOT DETECTED

## 2020-12-25 SURGERY — LOWER EXTREMITY ANGIOGRAPHY
Anesthesia: Moderate Sedation | Site: Leg Lower | Laterality: Left

## 2020-12-25 MED ORDER — FENTANYL CITRATE PF 50 MCG/ML IJ SOSY
PREFILLED_SYRINGE | INTRAMUSCULAR | Status: AC
  Filled 2020-12-25: qty 1

## 2020-12-25 MED ORDER — HEPARIN (PORCINE) 25000 UT/250ML-% IV SOLN: 600.0000 [IU]/h | INTRAVENOUS | Status: DC

## 2020-12-25 MED ORDER — HYDROMORPHONE HCL 1 MG/ML IJ SOLN
1.0000 mg | INTRAMUSCULAR | Status: AC
Start: 2020-12-25 — End: 2020-12-25
  Administered 2020-12-25: 1 mg via INTRAVENOUS
  Filled 2020-12-25: qty 1

## 2020-12-25 MED ORDER — ALUM & MAG HYDROXIDE-SIMETH 200-200-20 MG/5ML PO SUSP: 15.0000 mL | ORAL | Status: DC | PRN

## 2020-12-25 MED ORDER — SODIUM CHLORIDE 0.9 % IV SOLN: INTRAVENOUS | Status: DC

## 2020-12-25 MED ORDER — ONDANSETRON HCL 4 MG/2ML IJ SOLN: 4.0000 mg | Freq: Four times a day (QID) | INTRAMUSCULAR | Status: DC | PRN

## 2020-12-25 MED ORDER — HEPARIN SODIUM (PORCINE) 1000 UNIT/ML IJ SOLN
INTRAMUSCULAR | Status: DC | PRN
  Administered 2020-12-25: 5000 [IU] via INTRAVENOUS

## 2020-12-25 MED ORDER — FENTANYL CITRATE (PF) 100 MCG/2ML IJ SOLN
INTRAMUSCULAR | Status: DC | PRN
  Administered 2020-12-25 (×2): 25 ug via INTRAVENOUS
  Administered 2020-12-25 (×2): 50 ug via INTRAVENOUS

## 2020-12-25 MED ORDER — ALTEPLASE 2 MG IJ SOLR
INTRAMUSCULAR | Status: AC
  Filled 2020-12-25: qty 10

## 2020-12-25 MED ORDER — PANTOPRAZOLE SODIUM 40 MG PO TBEC
40.0000 mg | DELAYED_RELEASE_TABLET | Freq: Every day | ORAL | Status: DC
  Administered 2020-12-27 – 2020-12-30 (×4): 40 mg via ORAL
  Filled 2020-12-25 (×4): qty 1

## 2020-12-25 MED ORDER — GUAIFENESIN-DM 100-10 MG/5ML PO SYRP: 15.0000 mL | ORAL_SOLUTION | ORAL | Status: DC | PRN

## 2020-12-25 MED ORDER — IODIXANOL 320 MG/ML IV SOLN
INTRAVENOUS | Status: DC | PRN
  Administered 2020-12-25: 25 mL via INTRA_ARTERIAL

## 2020-12-25 MED ORDER — LABETALOL HCL 5 MG/ML IV SOLN
10.0000 mg | INTRAVENOUS | Status: DC | PRN
Start: 2020-12-25 — End: 2020-12-30
  Administered 2020-12-25 – 2020-12-26 (×2): 10 mg via INTRAVENOUS
  Filled 2020-12-25 (×2): qty 4

## 2020-12-25 MED ORDER — MIDAZOLAM HCL 5 MG/5ML IJ SOLN
INTRAMUSCULAR | Status: AC
  Filled 2020-12-25: qty 5

## 2020-12-25 MED ORDER — ALTEPLASE 1 MG/ML SYRINGE FOR VASCULAR PROCEDURE
INTRAMUSCULAR | Status: DC | PRN
  Administered 2020-12-25: 10 mg via INTRA_ARTERIAL

## 2020-12-25 MED ORDER — OXYCODONE HCL 5 MG PO TABS
5.0000 mg | ORAL_TABLET | ORAL | Status: DC | PRN
  Administered 2020-12-25 – 2020-12-29 (×15): 5 mg via ORAL
  Filled 2020-12-25 (×14): qty 1

## 2020-12-25 MED ORDER — HEPARIN (PORCINE) 25000 UT/250ML-% IV SOLN
INTRAVENOUS | Status: AC
  Administered 2020-12-25: 600 [IU]/h via INTRA_ARTERIAL
  Filled 2020-12-25: qty 250

## 2020-12-25 MED ORDER — PHENOL 1.4 % MT LIQD
1.0000 | OROMUCOSAL | Status: DC | PRN
  Filled 2020-12-25: qty 177

## 2020-12-25 MED ORDER — CLINDAMYCIN PHOSPHATE 300 MG/50ML IV SOLN: 300.0000 mg | Freq: Once | INTRAVENOUS | Status: AC

## 2020-12-25 MED ORDER — FAMOTIDINE 20 MG PO TABS
20.0000 mg | ORAL_TABLET | Freq: Two times a day (BID) | ORAL | Status: DC
  Administered 2020-12-25 – 2020-12-30 (×9): 20 mg via ORAL
  Filled 2020-12-25 (×9): qty 1

## 2020-12-25 MED ORDER — GABAPENTIN 300 MG PO CAPS
300.0000 mg | ORAL_CAPSULE | Freq: Every day | ORAL | Status: DC
  Administered 2020-12-27 – 2020-12-28 (×2): 300 mg via ORAL
  Filled 2020-12-25 (×3): qty 1

## 2020-12-25 MED ORDER — HYDRALAZINE HCL 20 MG/ML IJ SOLN
5.0000 mg | INTRAMUSCULAR | Status: DC | PRN
Start: 2020-12-25 — End: 2020-12-30
  Administered 2020-12-25: 5 mg via INTRAVENOUS
  Filled 2020-12-25: qty 1

## 2020-12-25 MED ORDER — HEPARIN SODIUM (PORCINE) 10000 UNIT/ML IJ SOLN
INTRAMUSCULAR | Status: AC
  Filled 2020-12-25: qty 1

## 2020-12-25 MED ORDER — HYDROMORPHONE HCL 1 MG/ML IJ SOLN: 1.0000 mg | Freq: Once | INTRAMUSCULAR | Status: AC | PRN

## 2020-12-25 MED ORDER — MIDAZOLAM HCL 2 MG/ML PO SYRP: 8.0000 mg | ORAL_SOLUTION | Freq: Once | ORAL | Status: DC | PRN

## 2020-12-25 MED ORDER — HEPARIN SODIUM (PORCINE) 1000 UNIT/ML IJ SOLN
INTRAMUSCULAR | Status: AC
  Filled 2020-12-25: qty 1

## 2020-12-25 MED ORDER — HYDROMORPHONE HCL 1 MG/ML IJ SOLN
INTRAMUSCULAR | Status: AC
  Administered 2020-12-25: 1 mg
  Filled 2020-12-25: qty 1

## 2020-12-25 MED ORDER — ROSUVASTATIN CALCIUM 10 MG PO TABS
5.0000 mg | ORAL_TABLET | Freq: Every day | ORAL | Status: DC
  Administered 2020-12-25 – 2020-12-29 (×5): 5 mg via ORAL
  Filled 2020-12-25 (×6): qty 1

## 2020-12-25 MED ORDER — FOLIC ACID 1 MG PO TABS
1.0000 mg | ORAL_TABLET | Freq: Every day | ORAL | Status: DC
  Administered 2020-12-27 – 2020-12-30 (×4): 1 mg via ORAL
  Filled 2020-12-25 (×4): qty 1

## 2020-12-25 MED ORDER — OXYCODONE HCL 5 MG PO TABS
ORAL_TABLET | ORAL | Status: AC
  Filled 2020-12-25: qty 1

## 2020-12-25 MED ORDER — HYDROMORPHONE HCL 1 MG/ML IJ SOLN
INTRAMUSCULAR | Status: AC
  Administered 2020-12-25: 1 mg via INTRAVENOUS
  Filled 2020-12-25: qty 1

## 2020-12-25 MED ORDER — LISINOPRIL 10 MG PO TABS
10.0000 mg | ORAL_TABLET | Freq: Every day | ORAL | Status: DC
  Administered 2020-12-27 – 2020-12-30 (×3): 10 mg via ORAL
  Filled 2020-12-25: qty 2
  Filled 2020-12-25 (×2): qty 1
  Filled 2020-12-25: qty 2
  Filled 2020-12-25: qty 1

## 2020-12-25 MED ORDER — CHLORHEXIDINE GLUCONATE CLOTH 2 % EX PADS
6.0000 | MEDICATED_PAD | Freq: Every day | CUTANEOUS | Status: DC
  Administered 2020-12-25 – 2020-12-28 (×4): 6 via TOPICAL

## 2020-12-25 MED ORDER — FERROUS SULFATE 325 (65 FE) MG PO TABS
325.0000 mg | ORAL_TABLET | Freq: Every day | ORAL | Status: DC
  Administered 2020-12-27 – 2020-12-30 (×4): 325 mg via ORAL
  Filled 2020-12-25 (×4): qty 1

## 2020-12-25 MED ORDER — ATORVASTATIN CALCIUM 10 MG PO TABS: 10.0000 mg | ORAL_TABLET | Freq: Every day | ORAL | Status: DC

## 2020-12-25 MED ORDER — SODIUM CHLORIDE 0.9 % IV SOLN
1.0000 mg/h | INTRAVENOUS | Status: AC
  Administered 2020-12-25: 1 mg/h
  Filled 2020-12-25: qty 10

## 2020-12-25 MED ORDER — MIDAZOLAM HCL 2 MG/2ML IJ SOLN
INTRAMUSCULAR | Status: DC | PRN
  Administered 2020-12-25 (×3): 1 mg via INTRAVENOUS
  Administered 2020-12-25: 2 mg via INTRAVENOUS

## 2020-12-25 MED ORDER — METHYLPREDNISOLONE SODIUM SUCC 125 MG IJ SOLR: 125.0000 mg | Freq: Once | INTRAMUSCULAR | Status: DC | PRN

## 2020-12-25 MED ORDER — ONDANSETRON HCL 4 MG/2ML IJ SOLN
4.0000 mg | Freq: Four times a day (QID) | INTRAMUSCULAR | Status: DC | PRN
  Administered 2020-12-27: 4 mg via INTRAVENOUS
  Filled 2020-12-25: qty 2

## 2020-12-25 MED ORDER — FAMOTIDINE 20 MG PO TABS: 40.0000 mg | ORAL_TABLET | Freq: Once | ORAL | Status: DC | PRN

## 2020-12-25 MED ORDER — HYDROMORPHONE HCL 1 MG/ML IJ SOLN
1.0000 mg | INTRAMUSCULAR | Status: DC | PRN
  Administered 2020-12-25 – 2020-12-29 (×13): 1 mg via INTRAVENOUS
  Filled 2020-12-25 (×13): qty 1

## 2020-12-25 MED ORDER — POTASSIUM CHLORIDE CRYS ER 20 MEQ PO TBCR
20.0000 meq | EXTENDED_RELEASE_TABLET | Freq: Once | ORAL | Status: AC
Start: 2020-12-25 — End: 2020-12-25
  Administered 2020-12-25: 20 meq via ORAL
  Filled 2020-12-25: qty 1

## 2020-12-25 MED ORDER — GABAPENTIN 300 MG PO CAPS
600.0000 mg | ORAL_CAPSULE | Freq: Every day | ORAL | Status: DC
  Administered 2020-12-25 – 2020-12-29 (×5): 600 mg via ORAL
  Filled 2020-12-25 (×5): qty 2

## 2020-12-25 MED ORDER — CLINDAMYCIN PHOSPHATE 300 MG/50ML IV SOLN
INTRAVENOUS | Status: AC
  Administered 2020-12-25: 300 mg via INTRAVENOUS
  Filled 2020-12-25: qty 50

## 2020-12-25 MED ORDER — VITAMIN B-12 1000 MCG PO TABS
500.0000 ug | ORAL_TABLET | Freq: Every day | ORAL | Status: DC
  Administered 2020-12-27 – 2020-12-30 (×4): 500 ug via ORAL
  Filled 2020-12-25 (×2): qty 1
  Filled 2020-12-25: qty 5
  Filled 2020-12-25 (×2): qty 1

## 2020-12-25 MED ORDER — METOPROLOL TARTRATE 5 MG/5ML IV SOLN
2.0000 mg | INTRAVENOUS | Status: DC | PRN
  Filled 2020-12-25: qty 5

## 2020-12-25 MED ORDER — MELATONIN 5 MG PO TABS
2.5000 mg | ORAL_TABLET | Freq: Every day | ORAL | Status: DC
  Administered 2020-12-25 – 2020-12-29 (×5): 2.5 mg via ORAL
  Filled 2020-12-25 (×5): qty 1

## 2020-12-25 MED ORDER — DIPHENHYDRAMINE HCL 50 MG/ML IJ SOLN: 50.0000 mg | Freq: Once | INTRAMUSCULAR | Status: DC | PRN

## 2020-12-25 MED ORDER — DOCUSATE SODIUM 100 MG PO CAPS
100.0000 mg | ORAL_CAPSULE | Freq: Two times a day (BID) | ORAL | Status: DC
  Administered 2020-12-25 – 2020-12-30 (×9): 100 mg via ORAL
  Filled 2020-12-25 (×9): qty 1

## 2020-12-25 SURGICAL SUPPLY — 22 items
BALLN LUTONIX DCB 4X100X130 (BALLOONS) ×2
BALLN LUTONIX DCB 5X100X130 (BALLOONS) ×2
BALLOON LUTONIX DCB 4X100X130 (BALLOONS) IMPLANT
BALLOON LUTONIX DCB 5X100X130 (BALLOONS) IMPLANT
CANISTER PENUMBRA ENGINE (MISCELLANEOUS) ×1 IMPLANT
CATH ANGIO 5F PIGTAIL 65CM (CATHETERS) ×1 IMPLANT
CATH BEACON 5 .038 100 VERT TP (CATHETERS) ×1 IMPLANT
CATH INDIGO CAT6 KIT (CATHETERS) ×1 IMPLANT
CATH INFUS 135CMX50CM (CATHETERS) ×1 IMPLANT
COVER PROBE U/S 5X48 (MISCELLANEOUS) ×1 IMPLANT
GLIDEWIRE ADV .035X260CM (WIRE) ×1 IMPLANT
GUIDEWIRE SUPER STIFF .035X180 (WIRE) ×1 IMPLANT
KIT ENCORE 26 ADVANTAGE (KITS) ×1 IMPLANT
PACK ANGIOGRAPHY (CUSTOM PROCEDURE TRAY) ×2 IMPLANT
SHEATH BRITE TIP 4FRX11 (SHEATH) ×1 IMPLANT
SHEATH BRITE TIP 5FRX11 (SHEATH) ×1 IMPLANT
SHEATH PINNACLE ST 6F 45CM (SHEATH) ×1 IMPLANT
SUT PROLENE 0 CT 1 30 (SUTURE) ×1 IMPLANT
SYR MEDRAD MARK 7 150ML (SYRINGE) ×1 IMPLANT
TUBING CONTRAST HIGH PRESS 48 (TUBING) ×2 IMPLANT
WIRE G V18X300CM (WIRE) ×1 IMPLANT
WIRE GUIDERIGHT .035X150 (WIRE) ×1 IMPLANT

## 2020-12-25 NOTE — Op Note (Signed)
[facility]  Percutaneous Study/Intervention Procedural Note   Date of Surgery: 12/25/2020  Surgeon(s):Demetrica Zipp    Assistants:none  Pre-operative Diagnosis: PAD with rest pain left lower extremity, acute on chronic ischemia  Post-operative diagnosis:  Same  Procedure(s) Performed:             1.  Ultrasound guidance for vascular access right femoral artery             2.  Catheter placement into left common femoral artery from right femoral approach             3.  Aortogram and selective left lower extremity angiogram             4.  Catheter directed thrombolytic therapy with 10 mg of tPA to the left SFA and popliteal arteries             5.  Mechanical thrombectomy to the left SFA and popliteal arteries with the penumbra CAT 6 device  6.  Percutaneous transluminal angioplasty of the proximal left SFA and the left popliteal artery with 5 mm diameter [name] proximally and 4 mm diameter [name] distally             7.  Placement of infusion catheter for continuous thrombolytic therapy using a 135 cm total length 50 cm working length catheter to go from the left common femoral artery down to the left tibioperoneal trunk through the SFA and popliteal arteries  EBL: 100 cc  Contrast: 25 cc  Fluoro Time: 5.2 minutes  Moderate Conscious Sedation Time: approximately 76 minutes using 5 mg of Versed and 150 mcg of Fentanyl              Indications:  Patient is a 65 y.o.female with acute on chronic ischemia of the left lower extremity with rest pain. The patient has noninvasive study showing occlusion of her previous interventions. The patient is brought in for angiography for further evaluation and potential treatment.  Due to the limb threatening nature of the situation, angiogram was performed for attempted limb salvage. The patient is aware that if the procedure fails, amputation would be expected.  The  patient also understands that even with successful revascularization, amputation may still be required due to the severity of the situation.  Risks and benefits are discussed and informed consent is obtained.   Procedure:  The patient was identified and appropriate procedural time out was performed.  The patient was then placed supine on the table and prepped and draped in the usual sterile fashion. Moderate conscious sedation was administered during a face to face encounter with the patient throughout the procedure with my supervision of the RN administering medicines and monitoring the patient's vital signs, pulse oximetry, telemetry and mental status throughout from the start of the procedure until the patient was taken to the recovery room. Ultrasound was used to evaluate the right common femoral artery.  It was patent .  A digital ultrasound image was acquired.  A Seldinger needle was used to access the right common femoral artery under direct ultrasound guidance and a permanent image was performed.  A 0.035 J wire was advanced without resistance and a 5Fr sheath was placed.  Pigtail catheter was placed into the aorta and an AP aortogram was performed. This demonstrated normal renal arteries and aorta and iliac segments without significant stenosis although the iliac vessels were quite small. I then crossed the aortic bifurcation and advanced to  the left femoral head. Selective left lower extremity angiogram was then performed. This demonstrated flush occlusion of the left SFA above the previously placed stents.  The common femoral artery profunda femoris artery was small and somewhat diseased providing the only flow distally.  There was then reconstitution of the popliteal artery roughly at the level of the knee.  There was a high medial takeoff of the anterior tibial artery and a long tibioperoneal trunk.  Distal runoff was quite sluggish, but all 3 tibial vessels appeared to be patent. It was felt that it  was in the patient's best interest to proceed with intervention after these images to avoid a second procedure and a larger amount of contrast and fluoroscopy based off of the findings from the initial angiogram. The patient was systemically heparinized and a 6 [place] destination sheath was then placed over the Terumo Advantage wire. I then used a Kumpe catheter and the advantage wire to easily cross through the occlusion and confirm intraluminal flow in the tibioperoneal trunk.  I then placed a V 18 wire after instilling 10 mg of tPA in the left SFA and popliteal arteries.  This dwelled for 15 minutes, and then for mechanical thrombectomy using the penumbra CAT 6 device with 3 passes made in the left SFA and popliteal arteries down into the tibioperoneal trunk.  A large amount of thrombus was removed, but there was a stenosis and thrombus both at the proximal edge of the previously placed stent at the origin of the SFA and proximal SFA as well as in the popliteal artery just below the previously placed stents.  I attempted to treat these with angioplasty using a 5 mm diameter by 10 cm length [name] proximally and a 4 mm diameter by 10 cm length angioplasty balloon which was also [name] distally.  These are both inflated 10 to 12 atm for a minute.  Completion imaging showed residual thrombus in both locations as well as sluggish flow distally and I felt our best option would be thrombolytic therapy at this point.  I then placed a 135 cm total length 50 cm working length thrombolytic catheter from the left common femoral artery through the SFA and popliteal arteries and down into the tibioperoneal trunk.  This is secured into place with silk sutures as was the sheath.  The patient was taken to the recovery room in stable condition having tolerated the procedure well.  Findings:               Aortogram: Renal arteries appeared patent.  The aorta and  iliac arteries were relatively small but did not have obvious hemodynamically significant stenosis with multiple previous stents placed             Left Lower Extremity:   This demonstrated flush occlusion of the left SFA above the previously placed stents.  The common femoral artery profunda femoris artery was small and somewhat diseased providing the only flow distally.  There was then reconstitution of the popliteal artery roughly at the level of the knee.  There was a high medial takeoff of the anterior tibial artery and a long tibioperoneal trunk.  Distal runoff was quite sluggish, but all 3 tibial vessels appeared to be patent.   Disposition: Patient was taken to the recovery room in stable condition having tolerated the procedure well.  Complications: None  Leotis Pain 12/25/2020 12:30 PM   This note was created with Dragon Medical transcription system. Any errors  in dictation are purely unintentional.

## 2020-12-25 NOTE — Progress Notes (Signed)
Pt. Med. With Oxycodone/Roxycodone 5 mg p.o. now for continued c/o severe pain to left foot. Pt. Dangling left foot over side of bed; moaning with pain.

## 2020-12-25 NOTE — Progress Notes (Signed)
Saw Dr. Wyn Quaker: made aware of pt. Continuous excruciating" pain. 1x order for Dilaudid received> Med. Given. See MAR. Pt. Tx now to ICU#11 via stretcher: stable for tx.

## 2020-12-25 NOTE — Progress Notes (Signed)
Pt. States the pain pill (Roxycodone) "helped a little bit."

## 2020-12-25 NOTE — Progress Notes (Signed)
Pt. States pain "it's in the ankle now." Pt. Still moaning with pain in left leg. Pt. Will not rate pain at this time. Left foot elevated on pillow now. Lysis catheter intact to RFA without complications at site at present.

## 2020-12-25 NOTE — Interval H&P Note (Signed)
History and Physical Interval Note:  12/25/2020 10:03 AM  Sylvia [name]  has presented today for surgery, with the diagnosis of LLE Angiography   Ischemic leg   BARD Rep  cc: Loni Muse.  The various methods of treatment have been discussed with the patient and family. After consideration of risks, benefits and other options for treatment, the patient has consented to  Procedure(s): LOWER EXTREMITY ANGIOGRAPHY (Left) as a surgical intervention.  The patient's history has been reviewed, patient examined, no change in status, stable for surgery.  I have reviewed the patient's chart and labs.  Questions were answered to the patient's satisfaction.     [name]

## 2020-12-25 NOTE — Progress Notes (Signed)
Pt. C/o extreme pain 10 out of 10 on scale 1-10 for left foot pain. Pt. Moaning with pain. Pt. Med. With dilaudid 1.0 mg slow IVP per orders.

## 2020-12-25 NOTE — Progress Notes (Signed)
Pt received from [facility] at 1640. Pt complaining of severe pain 10/10 on L foot radiating upwards to her L knee and L thigh, throbbing and tingling in nature. Prn analgesia given (see MAR) with some relief. Also hypertensive upon admission at 195/85, prn antihypertensive given (see MAR). No pulses noted on L DP and PT, LLE remains cold to touch- no change from previous assessment, Dr Wyn Quaker already aware. Per report from [facility] RN and pt's understanding as well, she may go back to OR c/o Dr Wyn Quaker this evening - kept NPO for now until we hear otherwise. Sister, Precious, called and updated of plan of care.

## 2020-12-26 ENCOUNTER — Encounter: Payer: Self-pay | Admitting: Vascular Surgery

## 2020-12-26 ENCOUNTER — Encounter
Admission: AD | Disposition: A | Discharge status: Home Health | Payer: Self-pay | Source: Home / Self Care | Attending: Vascular Surgery

## 2020-12-26 DIAGNOSIS — I70222 Atherosclerosis of native arteries of extremities with rest pain, left leg: Secondary | ICD-10-CM

## 2020-12-26 DIAGNOSIS — Z9282 Status post administration of tPA (rtPA) in a different facility within the last 24 hours prior to admission to current facility: Secondary | ICD-10-CM

## 2020-12-26 HISTORY — PX: LOWER EXTREMITY INTERVENTION: CATH118252

## 2020-12-26 LAB — SARS CORONAVIRUS 2 (TAT 6-24 HRS): SARS Coronavirus 2: NEGATIVE

## 2020-12-26 LAB — HEMOGLOBIN A1C
Hgb A1c MFr Bld: 8.7 % — ABNORMAL HIGH (ref 4.8–5.6)
Mean Plasma Glucose: 202.99 mg/dL

## 2020-12-26 LAB — GLUCOSE, CAPILLARY
Glucose-Capillary: 135 mg/dL — ABNORMAL HIGH (ref 70–99)
Glucose-Capillary: 146 mg/dL — ABNORMAL HIGH (ref 70–99)
Glucose-Capillary: 204 mg/dL — ABNORMAL HIGH (ref 70–99)
Glucose-Capillary: 210 mg/dL — ABNORMAL HIGH (ref 70–99)

## 2020-12-26 SURGERY — LOWER EXTREMITY INTERVENTION
Anesthesia: Moderate Sedation | Laterality: Left

## 2020-12-26 MED ORDER — TIROFIBAN HCL IN NACL 5-0.9 MG/100ML-% IV SOLN: 0.1500 ug/kg/min | INTRAVENOUS | Status: DC

## 2020-12-26 MED ORDER — MIDAZOLAM HCL 2 MG/2ML IJ SOLN
INTRAMUSCULAR | Status: AC
  Filled 2020-12-26: qty 2

## 2020-12-26 MED ORDER — SODIUM CHLORIDE 0.9 % IV SOLN
1.0000 mg/h | INTRAVENOUS | Status: DC
  Administered 2020-12-26: 1 mg/h
  Filled 2020-12-26: qty 10

## 2020-12-26 MED ORDER — FENTANYL CITRATE (PF) 100 MCG/2ML IJ SOLN
INTRAMUSCULAR | Status: DC | PRN
  Administered 2020-12-26: 50 ug via INTRAVENOUS

## 2020-12-26 MED ORDER — INSULIN ASPART 100 UNIT/ML IJ SOLN
0.0000 [IU] | Freq: Three times a day (TID) | INTRAMUSCULAR | Status: DC
  Administered 2020-12-26: 5 [IU] via SUBCUTANEOUS
  Administered 2020-12-27 – 2020-12-29 (×8): 3 [IU] via SUBCUTANEOUS
  Administered 2020-12-29 – 2020-12-30 (×2): 5 [IU] via SUBCUTANEOUS
  Administered 2020-12-30: 3 [IU] via SUBCUTANEOUS
  Filled 2020-12-26 (×12): qty 1

## 2020-12-26 MED ORDER — MIDAZOLAM HCL 2 MG/2ML IJ SOLN
INTRAMUSCULAR | Status: DC | PRN
  Administered 2020-12-26: 2 mg via INTRAVENOUS

## 2020-12-26 MED ORDER — TIROFIBAN HCL IN NACL 5-0.9 MG/100ML-% IV SOLN
0.0750 ug/kg/min | INTRAVENOUS | Status: AC
  Administered 2020-12-26 (×2): 0.075 ug/kg/min via INTRAVENOUS
  Filled 2020-12-26: qty 100

## 2020-12-26 MED ORDER — FENTANYL CITRATE (PF) 100 MCG/2ML IJ SOLN
INTRAMUSCULAR | Status: AC
  Filled 2020-12-26: qty 2

## 2020-12-26 MED ORDER — CEFAZOLIN SODIUM-DEXTROSE 2-4 GM/100ML-% IV SOLN
INTRAVENOUS | Status: AC
  Filled 2020-12-26: qty 100

## 2020-12-26 MED ORDER — CLINDAMYCIN PHOSPHATE 300 MG/50ML IV SOLN
INTRAVENOUS | Status: AC
  Filled 2020-12-26: qty 50

## 2020-12-26 MED ORDER — TIROFIBAN HCL IV 12.5 MG/250 ML
INTRAVENOUS | Status: AC
  Administered 2020-12-26: 12500 ug
  Filled 2020-12-26: qty 250

## 2020-12-26 MED ORDER — HEPARIN SODIUM (PORCINE) 1000 UNIT/ML IJ SOLN
INTRAMUSCULAR | Status: AC
  Filled 2020-12-26: qty 1

## 2020-12-26 MED ORDER — CLINDAMYCIN PHOSPHATE 300 MG/50ML IV SOLN: 300.0000 mg | Freq: Once | INTRAVENOUS | Status: DC

## 2020-12-26 SURGICAL SUPPLY — 11 items
BALLN LUTONIX 018 5X80X130 (BALLOONS) ×2
BALLOON LUTONIX 018 5X80X130 (BALLOONS) ×1 IMPLANT
CANISTER PENUMBRA ENGINE (MISCELLANEOUS) ×2 IMPLANT
CATH INDIGO CAT6 KIT (CATHETERS) ×2 IMPLANT
DEVICE SAFEGUARD 24CM (GAUZE/BANDAGES/DRESSINGS) ×2 IMPLANT
DEVICE STARCLOSE SE CLOSURE (Vascular Products) ×2 IMPLANT
KIT ENCORE 26 ADVANTAGE (KITS) ×2 IMPLANT
PACK ANGIOGRAPHY (CUSTOM PROCEDURE TRAY) ×2 IMPLANT
STENT VIABAHN 6X7.5X120 (Permanent Stent) ×2 IMPLANT
WIRE G V18X300CM (WIRE) ×2 IMPLANT
WIRE GUIDERIGHT .035X150 (WIRE) ×2 IMPLANT

## 2020-12-26 NOTE — Progress Notes (Signed)
Inpatient Diabetes Program Recommendations  AACE/ADA: New Consensus Statement on Inpatient Glycemic Control   Target Ranges:  Prepandial:   less than 140 mg/dL      Peak postprandial:   less than 180 mg/dL (1-2 hours)      Critically ill patients:  140 - 180 mg/dL   Results for Sylvia [name], Sylvia [name] (MRN [id number]) as of 12/26/2020 12:31  Ref. Range 12/25/2020 10:15 12/25/2020 12:38 12/25/2020 16:30 12/25/2020 23:56 12/26/2020 11:34  Glucose-Capillary Latest Ref Range: 70 - 99 mg/dL 235 (H) 361 (H) 443 (H) 204 (H) 146 (H)   Review of Glycemic Control  Diabetes history: DM2 Outpatient Diabetes medications: Jardiance 10 mg daily, Januvia 100 mg daily Current orders for Inpatient glycemic control: None  Inpatient Diabetes Program Recommendations:    Insulin: While inpatient, please consider ordering CBGs AC&HS and Novolog 0-9 units TID with meals and Novolog 0-5 units QHS.  Thanks, Orlando Penner, RN, MSN, CDE Diabetes Coordinator Inpatient Diabetes Program [phone] (Team Pager from 8am to 5pm)

## 2020-12-26 NOTE — Op Note (Signed)
[facility]  Percutaneous Study/Intervention Procedural Note   Date of Surgery: 12/26/2020  Surgeon(s):Hiliana Eilts    Assistants:none  Pre-operative Diagnosis: PAD with rest pain left lower extremity, status post overnight thrombolytic therapy  Post-operative diagnosis:  Same  Procedure(s) Performed:             1.  Left lower extremity angiogram             2.  Catheter placement into left popliteal artery from right femoral approach             3.  Mechanical thrombectomy of the left SFA with the penumbra CAT 6 device             4.  Viabahn stent placement of the proximal left SFA with 6 mm diameter by 7.5 cm length Viabahn stent             5.  StarClose closure device right femoral artery  EBL: 100 cc  Contrast: 40 cc  Fluoro Time: 2.4 minutes  Moderate Conscious Sedation Time: approximately 23 minutes using 2 mg of Versed and 50 mcg of Fentanyl              Indications:  Patient is a 65 y.o.female with ischemic left lower extremity status post overnight thrombolytic therapy. The patient is brought in for angiography for further evaluation and potential treatment.  Due to the limb threatening nature of the situation, angiogram was performed for attempted limb salvage. The patient is aware that if the procedure fails, amputation would be expected.  The patient also understands that even with successful revascularization, amputation may still be required due to the severity of the situation.  Risks and benefits are discussed and informed consent is obtained.   Procedure:  The patient was identified and appropriate procedural time out was performed.  The patient was then placed supine on the table and prepped and draped in the usual sterile fashion. Moderate conscious sedation was administered during a face to face encounter with the patient throughout the procedure with my supervision of the RN administering medicines and monitoring the patient's vital signs,  pulse oximetry, telemetry and mental status throughout from the start of the procedure until the patient was taken to the recovery room.  The existing thrombolytic catheter had an image performed through it in the tibioperoneal trunk distally showing the anterior tibial and peroneal arteries to be continuous distally.  The catheter was then removed over a V 18 wire.  Selective left lower extremity angiogram was then performed. This demonstrated stenosis with thrombus adherent to the proximal SFA just above the previously placed stent.  The stents were now patent.  Both the anterior tibial and peroneal arteries have flow distally with some mild to moderate narrowing in the tibioperoneal trunk.  This may be some residual thrombus and some degree of stenosis. It was felt that it was in the patient's best interest to proceed with intervention after these images to avoid a second procedure and a larger amount of contrast and fluoroscopy based off of the findings from the initial angiogram.  I first made 2 passes with the penumbra CAT 6 device in the proximal left SFA.  Mechanical thrombectomy was performed in this area.  A small amount of thrombus was removed, but this appeared to have more of a stenosis and dissection so I elected to stent up to the origin of the profunda femoris artery.  A 6 mm diameter by 7.5 cm length Viabahn  stent was deployed bridging to the old stent and going up to the base of the profunda femoris artery and postdilated with a 5 mm balloon that was also taken up into the common femoral artery.  Completion imaging showed improved flow with less than 20% residual stenosis in the SFA.  The common femoral artery and the origin of the profunda femoris artery both were small and she may ultimately require femoral endarterectomy, but her flow was markedly improved at this point.  I elected to terminate the procedure. The sheath was removed and StarClose closure device was deployed in the right femoral  artery with excellent hemostatic result. The patient was taken to the recovery room in stable condition having tolerated the procedure well.  Findings:                            Left Lower Extremity: Stenosis with thrombus adherent to the proximal SFA just above the previously placed stent.  The stents were now patent.  Both the anterior tibial and peroneal arteries have flow distally with some mild to moderate narrowing in the tibioperoneal trunk.  This may be some residual thrombus and some degree of stenosis.   Disposition: Patient was taken to the recovery room in stable condition having tolerated the procedure well.  Complications: None  [name] 12/26/2020 1:24 PM   This note was created with Dragon Medical transcription system. Any errors in dictation are purely unintentional.

## 2020-12-26 NOTE — Progress Notes (Signed)
Pain treated throughout the night with prn medications. Still having severe pain in LLE. Pt has heparin and alteplase infusing. Able to doppler pulses in LLE on last assessment. B/p running in 160s. Other vitals stable

## 2020-12-27 LAB — GLUCOSE, CAPILLARY
Glucose-Capillary: 163 mg/dL — ABNORMAL HIGH (ref 70–99)
Glucose-Capillary: 172 mg/dL — ABNORMAL HIGH (ref 70–99)
Glucose-Capillary: 155 mg/dL — ABNORMAL HIGH (ref 70–99)
Glucose-Capillary: 145 mg/dL — ABNORMAL HIGH (ref 70–99)

## 2020-12-27 NOTE — Progress Notes (Signed)
1 Day Post-Op   Subjective/Chief Complaint: Patient is comfortable, has left leg soreness, but the leg is warm.    Objective: Vital signs in last 24 hours: Temp:  [98 F (36.7 C)-99.8 F (37.7 C)] 98.8 F (37.1 C) (09/10 0800) Pulse Rate:  [91-119] 118 (09/10 1000) Resp:  [11-20] 19 (09/10 1000) BP: (111-185)/(60-115) 140/82 (09/10 1000) SpO2:  [91 %-100 %] 100 % (09/10 1000) Last BM Date: 12/23/20  Intake/Output from previous day: 09/09 0701 - 09/10 0700 In: 726.4 [I.V.:726.4] Out: 1800 [Urine:1800] Intake/Output this shift: Total I/O In: 17 [I.V.:17] Out: 350 [Urine:350]  General appearance: alert, cooperative, appears stated age, and no distress Head: Normocephalic, without obvious abnormality, atraumatic Eyes: conjunctivae/corneas clear. PERRL, EOM's intact. Fundi benign. Resp: clear to auscultation bilaterally and normal percussion bilaterally Chest wall: no tenderness Cardio: regular rate and rhythm, S1, S2 normal, no murmur, click, rub or gallop, normal apical impulse, and regular rate and rhythm Extremities: Left foot is warm with strond doppler signal.  Pulses: Right BKA, left foot warm  Incision/Wound: Right groin puncture site is clean and dry.  Lab Results:  Recent Labs    12/25/20 1648  WBC 20.1*  HGB 11.4*  HCT 31.6*  PLT 225   BMET Recent Labs    12/25/20 1003 12/25/20 1648  NA  --  139  K  --  3.8  CL  --  107  CO2  --  23  GLUCOSE  --  232*  BUN 16 15  CREATININE 1.02* 0.93  CALCIUM  --  9.2   PT/INR Recent Labs    12/25/20 1648  LABPROT 14.6  INR 1.1   ABG No results for input(s): PHART, HCO3 in the last 72 hours.  Invalid input(s): PCO2, PO2  Studies/Results: PERIPHERAL VASCULAR CATHETERIZATION  Result Date: 12/26/2020 See surgical note for result.  PERIPHERAL VASCULAR CATHETERIZATION  Result Date: 12/25/2020 See surgical note for result.   Anti-infectives: Anti-infectives (From admission, onward)    Start      Dose/Rate Route Frequency Ordered Stop   12/26/20 1345  clindamycin (CLEOCIN) IVPB 300 mg  Status:  Discontinued        300 mg 100 mL/hr over 30 Minutes Intravenous  Once 12/26/20 1340 12/26/20 1430   12/26/20 1247  clindamycin (CLEOCIN) 300 MG/50ML IVPB       Note to Pharmacy: Alger Simons   : cabinet override      12/26/20 1247 12/27/20 0059   12/26/20 1242  ceFAZolin (ANCEF) 2-4 GM/100ML-% IVPB       Note to Pharmacy: Talbert Forest   : cabinet override      12/26/20 1242 12/27/20 0044   12/25/20 0330  clindamycin (CLEOCIN) IVPB 300 mg        300 mg 100 mL/hr over 30 Minutes Intravenous  Once 12/25/20 0329 12/25/20 1158       Assessment/Plan: s/p Procedure(s): LOWER EXTREMITY INTERVENTION (Left) Okay to transfer to the floor after groin PD device removed.   LOS: 2 days    Louisa Second 12/27/2020

## 2020-12-27 NOTE — Plan of Care (Signed)
Neuro: alert and oriented Resp: stable on room air CV: afebrile, vital signs stable, pulses remain palpable and audible with doppler, some increased swelling/tenderness/firmness of left calf-MD notified GIGU: tolerating PO well, voiding using bedpan, no BM, PRN Zofran given x 1 for nausea Skin:clean dry and intact, PAD removed without issue, dressing applied Social: Patient in communication with family and friends via personal cell phone. Father at bedside this AM.   Problem: Education: Goal: Knowledge of General Education information will improve Description: Including pain rating scale, medication(s)/side effects and non-pharmacologic comfort measures Outcome: Progressing   Problem: Health Behavior/Discharge Planning: Goal: Ability to manage health-related needs will improve Outcome: Progressing   Problem: Clinical Measurements: Goal: Ability to maintain clinical measurements within normal limits will improve Outcome: Progressing Goal: Will remain free from infection Outcome: Progressing Goal: Diagnostic test results will improve Outcome: Progressing Goal: Respiratory complications will improve Outcome: Progressing Goal: Cardiovascular complication will be avoided Outcome: Progressing   Problem: Activity: Goal: Risk for activity intolerance will decrease Outcome: Progressing   Problem: Nutrition: Goal: Adequate nutrition will be maintained Outcome: Progressing   Problem: Coping: Goal: Level of anxiety will decrease Outcome: Progressing   Problem: Elimination: Goal: Will not experience complications related to bowel motility Outcome: Progressing Goal: Will not experience complications related to urinary retention Outcome: Progressing   Problem: Pain Managment: Goal: General experience of comfort will improve Outcome: Progressing   Problem: Safety: Goal: Ability to remain free from injury will improve Outcome: Progressing   Problem: Skin Integrity: Goal: Risk  for impaired skin integrity will decrease Outcome: Progressing

## 2020-12-27 NOTE — Progress Notes (Addendum)
Patient is A&O x's 4 this shift. Lungs clear and on RA. Abd soft, denies abd pain, reports last BM was on 12/23/20. PAD with 40 ml of air to right femoral continues to have a small amount of oozing noted, no changes this shift. Patient has c/o sharp shooting pain 8-10/10 to left lower extremity this shift with some improvement noted after PRN oxycodone given. Right lower extremity warm, dry and with palpable popliteal pulses. Able to BL palpate popliteal pulse and able to doppler pedal pulse to LLE. SBP improved this shift, no PRNs given for BP or HR this shift. Patient instructed to call for any assistance, pt verbalizes understanding. Call light within reach.

## 2020-12-28 LAB — GLUCOSE, CAPILLARY
Glucose-Capillary: 172 mg/dL — ABNORMAL HIGH (ref 70–99)
Glucose-Capillary: 182 mg/dL — ABNORMAL HIGH (ref 70–99)
Glucose-Capillary: 154 mg/dL — ABNORMAL HIGH (ref 70–99)

## 2020-12-28 MED ORDER — APIXABAN 2.5 MG PO TABS
2.5000 mg | ORAL_TABLET | Freq: Two times a day (BID) | ORAL | Status: DC
  Administered 2020-12-28 – 2020-12-30 (×5): 2.5 mg via ORAL
  Filled 2020-12-28 (×5): qty 1

## 2020-12-28 MED ORDER — CLOPIDOGREL BISULFATE 75 MG PO TABS
75.0000 mg | ORAL_TABLET | Freq: Every day | ORAL | Status: DC
  Administered 2020-12-28 – 2020-12-30 (×3): 75 mg via ORAL
  Filled 2020-12-28 (×3): qty 1

## 2020-12-28 NOTE — Progress Notes (Signed)
2 Days Post-Op   Subjective/Chief Complaint: Patient is doing well, the patient's left lower extremity still sore but has improved.  She is enjoying her lunch and seems to be in good spirits.   Objective: Vital signs in last 24 hours: Temp:  [98.3 F (36.8 C)-99.5 F (37.5 C)] 98.3 F (36.8 C) (09/11 1200) Pulse Rate:  [65-120] 99 (09/11 1100) Resp:  [4-21] 13 (09/11 1100) BP: (89-128)/(55-73) 99/55 (09/11 1100) SpO2:  [69 %-100 %] 94 % (09/11 1100) Weight:  [59.7 kg] 59.7 kg (09/11 0427) Last BM Date: 12/23/20  Intake/Output from previous day: 09/10 0701 - 09/11 0700 In: 17 [I.V.:17] Out: 2000 [Urine:2000] Intake/Output this shift: Total I/O In: -  Out: 525 [Urine:525]  General appearance: alert, cooperative, and appears stated age Resp: clear to auscultation bilaterally Chest wall: no tenderness Extremities: extremities normal, atraumatic, no cyanosis or edema Pulses: Left Pulses: FEM: doppler, POP: doppler, DP: doppler, PT: doppler Incision/Wound:  Lab Results:  Recent Labs    12/25/20 1648  WBC 20.1*  HGB 11.4*  HCT 31.6*  PLT 225   BMET Recent Labs    12/25/20 1648  NA 139  K 3.8  CL 107  CO2 23  GLUCOSE 232*  BUN 15  CREATININE 0.93  CALCIUM 9.2   PT/INR Recent Labs    12/25/20 1648  LABPROT 14.6  INR 1.1   ABG No results for input(s): PHART, HCO3 in the last 72 hours.  Invalid input(s): PCO2, PO2  Studies/Results: PERIPHERAL VASCULAR CATHETERIZATION  Result Date: 12/26/2020 See surgical note for result.   Anti-infectives: Anti-infectives (From admission, onward)    Start     Dose/Rate Route Frequency Ordered Stop   12/26/20 1345  clindamycin (CLEOCIN) IVPB 300 mg  Status:  Discontinued        300 mg 100 mL/hr over 30 Minutes Intravenous  Once 12/26/20 1340 12/26/20 1430   12/26/20 1247  clindamycin (CLEOCIN) 300 MG/50ML IVPB       Note to Pharmacy: Alger Simons   : cabinet override      12/26/20 1247 12/27/20 0059   12/26/20  1242  ceFAZolin (ANCEF) 2-4 GM/100ML-% IVPB       Note to Pharmacy: Talbert Forest   : cabinet override      12/26/20 1242 12/27/20 0044   12/25/20 0330  clindamycin (CLEOCIN) IVPB 300 mg        300 mg 100 mL/hr over 30 Minutes Intravenous  Once 12/25/20 0329 12/25/20 1158       Assessment/Plan: s/p Procedure(s): LOWER EXTREMITY INTERVENTION (Left) I will transfer the patient to a MedSurg bed.  She will continue with all current medications.  Patient will need PT OT.  We will plan for discharge tomorrow.  LOS: 3 days    Louisa Second 12/28/2020

## 2020-12-28 NOTE — Plan of Care (Signed)
Neuro: alert and oriented, moving independently in the bed/transferred to floor bed without assistance, encouraging self to be independent Resp: stable on room air CV: afebrile, vital signs stable,  GIGU: tolerating PO well, voiding per bedpan Skin:clean dry and intact, Right groin site clean dry and intact with gauze and tegaderm dressing Social: patient in communication with family/friends via personal cell phone.  Events: Transfer to room 219, report given to Vikki Ports, RN  Problem: Education: Goal: Knowledge of General Education information will improve Description: Including pain rating scale, medication(s)/side effects and non-pharmacologic comfort measures Outcome: Progressing   Problem: Health Behavior/Discharge Planning: Goal: Ability to manage health-related needs will improve Outcome: Progressing   Problem: Clinical Measurements: Goal: Ability to maintain clinical measurements within normal limits will improve Outcome: Progressing Goal: Will remain free from infection Outcome: Progressing Goal: Diagnostic test results will improve Outcome: Progressing Goal: Respiratory complications will improve Outcome: Progressing Goal: Cardiovascular complication will be avoided Outcome: Progressing   Problem: Activity: Goal: Risk for activity intolerance will decrease Outcome: Progressing   Problem: Nutrition: Goal: Adequate nutrition will be maintained Outcome: Progressing   Problem: Coping: Goal: Level of anxiety will decrease Outcome: Progressing   Problem: Elimination: Goal: Will not experience complications related to bowel motility Outcome: Progressing Goal: Will not experience complications related to urinary retention Outcome: Progressing   Problem: Pain Managment: Goal: General experience of comfort will improve Outcome: Progressing   Problem: Safety: Goal: Ability to remain free from injury will improve Outcome: Progressing   Problem: Skin  Integrity: Goal: Risk for impaired skin integrity will decrease Outcome: Progressing

## 2020-12-29 ENCOUNTER — Encounter: Payer: Self-pay | Admitting: Vascular Surgery

## 2020-12-29 LAB — GLUCOSE, CAPILLARY
Glucose-Capillary: 182 mg/dL — ABNORMAL HIGH (ref 70–99)
Glucose-Capillary: 223 mg/dL — ABNORMAL HIGH (ref 70–99)
Glucose-Capillary: 171 mg/dL — ABNORMAL HIGH (ref 70–99)
Glucose-Capillary: 153 mg/dL — ABNORMAL HIGH (ref 70–99)
Glucose-Capillary: 141 mg/dL — ABNORMAL HIGH (ref 70–99)

## 2020-12-29 MED ORDER — HYDROMORPHONE HCL 1 MG/ML IJ SOLN
2.0000 mg | INTRAMUSCULAR | Status: DC | PRN
  Administered 2020-12-29 – 2020-12-30 (×2): 2 mg via INTRAVENOUS
  Filled 2020-12-29 (×2): qty 2

## 2020-12-29 MED ORDER — OXYCODONE HCL 5 MG PO TABS
10.0000 mg | ORAL_TABLET | ORAL | Status: DC | PRN
Start: 2020-12-29 — End: 2020-12-30
  Administered 2020-12-29 – 2020-12-30 (×4): 10 mg via ORAL
  Filled 2020-12-29 (×4): qty 2

## 2020-12-29 NOTE — Evaluation (Signed)
Occupational Therapy Evaluation Patient Details Name: Sylvia [name] MRN: [id number] DOB: [birth year] Today's Date: 12/29/2020   History of Present Illness Patient is a 65 y.o.female with acute on chronic ischemia of the left lower extremity with rest pain. The patient has noninvasive study showing occlusion of her previous interventions. The patient is brought in for angiography for further evaluation and potential treatment.   Clinical Impression   Pt was seen for OT evaluation this date. Prior to hospital admission, pt was modified independent with ADL and mobility, using SPC + RLE prosthesis. Pt lives with her spouse and endorses having assist available at home. Currently pt demonstrates impairments as described below including pain, weakness, and impaired balance (See OT problem list) which functionally limit her ability to perform ADL/self-care tasks. Pt currently requires increased assist for ADL and ADL mobility.  Pt would benefit from skilled OT services to address noted impairments and functional limitations (see below for any additional details) in order to maximize safety and independence while minimizing falls risk and caregiver burden. Upon hospital discharge, recommend HHOT to maximize pt safety and return to functional independence during meaningful occupations of daily life.     Recommendations for follow up therapy are one component of a multi-disciplinary discharge planning process, led by the attending physician.  Recommendations may be updated based on patient status, additional functional criteria and insurance authorization.   Follow Up Recommendations  Home health OT;Supervision - Intermittent    Equipment Recommendations  Tub/shower seat    Recommendations for Other Services       Precautions / Restrictions Precautions Precautions: Fall Precaution Comments: Previous R BKA. Restrictions Weight Bearing Restrictions: Yes      Mobility Bed Mobility                General bed mobility comments: In bed, pain limited.    Transfers Overall transfer level: Needs assistance Equipment used: Rolling walker (2 wheeled) Transfers: Sit to/from Stand Sit to Stand: Min assist         General transfer comment: Physical assist due to pain in L foot. VC's for hand placement.    Balance Overall balance assessment: Needs assistance Sitting-balance support: No upper extremity supported;Feet supported Sitting balance-Leahy Scale: Normal       Standing balance-Leahy Scale: Fair Standing balance comment: Relies on RW for UE support due to L foot pain.                           ADL either performed or assessed with clinical judgement   ADL                                         General ADL Comments: Pt able to perform LB dressing from long sitting position in bed with set up only. Anticipate CGA for ADL transfers +AD + RLE prosthetic on. Pt too pain limited to attempt at time of OT eval.     Vision         Perception     Praxis      Pertinent Vitals/Pain Pain Assessment: 0-10 Pain Score: 10-Worst pain ever Pain Location: L calf/foot Pain Descriptors / Indicators: Pressure;Throbbing Pain Intervention(s): Limited activity within patient's tolerance;Monitored during session;Premedicated before session;Repositioned     Hand Dominance Right   Extremity/Trunk Assessment Upper Extremity Assessment Upper Extremity Assessment: Generalized weakness   Lower Extremity Assessment  Lower Extremity Assessment: Generalized weakness;LLE deficits/detail;RLE deficits/detail RLE Deficits / Details: R BKA; does experience phantom limb pain LLE Deficits / Details: weak, painful to touch on shin and calf. No redness noted. Foot swollen.   Cervical / Trunk Assessment Cervical / Trunk Assessment: Normal   Communication Communication Communication: No difficulties   Cognition Arousal/Alertness: Awake/alert Behavior During  Therapy: WFL for tasks assessed/performed Overall Cognitive Status: Within Functional Limits for tasks assessed                                     General Comments  Swelling noted in L dorsum of foot.    Exercises Other Exercises Other Exercises: Role of PT in acute setting. D/c recs. Other Exercises: Pt educated in role of OT, AE and home/routines modifications to improve safety/indep with ADL   Shoulder Instructions      Home Living Family/patient expects to be discharged to:: Private residence Living Arrangements: Spouse/significant other Available Help at Discharge: Family;Friend(s);Available 24 hours/day Type of Home: House Home Access: Ramped entrance;Stairs to enter Entrance Stairs-Number of Steps: 9 STE with one entrance, ramped entrance for the other. B rails   Home Layout: One level     Bathroom Shower/Tub: Chief Strategy Officer: Standard Bathroom Accessibility: Yes   Home Equipment: Cane - single point;Hand held shower head;Wheelchair - manual;Other (comment) (R prosthesis for BKA)   Additional Comments: Pt reprorts able to get RW if needed.      Prior Functioning/Environment Level of Independence: Independent with assistive device(s)        Comments: Denies falls. Ambulates with SPC household and community distances. Mod indep with ADL.        OT Problem List: Decreased strength;Pain;Impaired balance (sitting and/or standing);Decreased knowledge of use of DME or AE      OT Treatment/Interventions: Self-care/ADL training;Therapeutic exercise;Therapeutic activities;DME and/or AE instruction;Patient/family education;Balance training;Energy conservation    OT Goals(Current goals can be found in the care plan section) Acute Rehab OT Goals Patient Stated Goal: have less pain and go home OT Goal Formulation: With patient Time For Goal Achievement: 01/12/21 Potential to Achieve Goals: Good ADL Goals Pt Will Transfer to Toilet:  with modified independence;ambulating;bedside commode (LRAD PRN) Additional ADL Goal #1: Pt will perform seated bath wiht set up and remote supervision for safety. Additional ADL Goal #2: Pt will verbalize plan to implement at least 1 learned cognitive behavioral pain coping strategy to support overall pain mgt.  OT Frequency: Min 1X/week   Barriers to D/C:            Co-evaluation              AM-PAC OT "6 Clicks" Daily Activity     Outcome Measure Help from another person eating meals?: None Help from another person taking care of personal grooming?: None Help from another person toileting, which includes using toliet, bedpan, or urinal?: A Little Help from another person bathing (including washing, rinsing, drying)?: A Little Help from another person to put on and taking off regular upper body clothing?: None Help from another person to put on and taking off regular lower body clothing?: A Little 6 Click Score: 21   End of Session    Activity Tolerance: Patient limited by pain Patient left: in bed;with call bell/phone within reach;with bed alarm set  OT Visit Diagnosis: Other abnormalities of gait and mobility (R26.89);Muscle weakness (generalized) (M62.81);Pain Pain - Right/Left:  Left Pain - part of body: Leg;Ankle and joints of foot                Time: 7209-4709 OT Time Calculation (min): 17 min Charges:  OT General Charges $OT Visit: 1 Visit OT Evaluation $OT Eval Moderate Complexity: 1 Mod  Arman Filter., MPH, MS, OTR/L ascom [phone] 12/29/20, 4:06 PM

## 2020-12-29 NOTE — Progress Notes (Signed)
[facility]  Daily Progress Note   Subjective  -   Still complaining of a lot of pain.  Left leg is somewhat swollen  Objective Vitals:   12/28/20 2209 12/29/20 0056 12/29/20 0454 12/29/20 0735  BP: 104/64 106/60 (!) 101/57 (!) 98/58  Pulse: (!) 108 (!) 106 (!) 104 98  Resp: 18  18 18   Temp: 99.1 F (37.3 C)  98.9 F (37.2 C) 99.9 F (37.7 C)  TempSrc:   Oral Oral  SpO2: 91%  98% 98%  Weight:      Height:        Intake/Output Summary (Last 24 hours) at 12/29/2020 1513 Last data filed at 12/29/2020 1041 Gross per 24 hour  Intake 600 ml  Output 500 ml  Net 100 ml    PULM  CTAB CV  mildly tachycardic VASC  1-2+ left lower extremity swelling.  1+ left PT and DP pulses.  Laboratory CBC    Component Value Date/Time   WBC 20.1 (H) 12/25/2020 1648   HGB 11.4 (L) 12/25/2020 1648   HGB 8.1 (L) 05/09/2019 1539   HCT 31.6 (L) 12/25/2020 1648   HCT 24.6 (L) 05/09/2019 1539   PLT 225 12/25/2020 1648   PLT 470 (H) 05/09/2019 1539    BMET    Component Value Date/Time   NA 139 12/25/2020 1648   K 3.8 12/25/2020 1648   CL 107 12/25/2020 1648   CO2 23 12/25/2020 1648   GLUCOSE 232 (H) 12/25/2020 1648   BUN 15 12/25/2020 1648   CREATININE 0.93 12/25/2020 1648   CALCIUM 9.2 12/25/2020 1648   GFRNONAA >60 12/25/2020 1648   GFRAA >60 11/23/2019 1505    Assessment/Planning: POD #3 s/p left lower extremity revascularization  Overall doing well.  Reperfusion swelling is expected Will increase her pain medication. Working with physical therapy today Potentially home tomorrow or Wednesday   Monday  12/29/2020, 3:13 PM

## 2020-12-29 NOTE — Evaluation (Signed)
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 The patient has noninvasive study showing occlusion of her previous interventions. The patient is brought in for angiography for further evaluation and potential treatment.   Clinical Impression  Pt admitted with above diagnosis. Pt received sitting upright in bed agreeable to PT services. Reports L foot pain at 10/10 NPS but wants to work with therapy. Able to state pti mod-I with RLE prosthesis and SPC with household and community ambulation. BP taken prior to mobility due to low readings in EMR. Recorded in sitting at 127/64 mm Hg and HR:112 BPM. Pt ble to don gel liner, socks and RLE prothesis independently. ModA+1 to STS from low recliner due to difficulty bearing weight on LLE due to pain in foot. Once standing, Pt able to maintain static standing balance with UE's on RW. Pt amb step to pattern with heavy reliance on RW due to L foot pain, 20' (minguard initially then supervision) in room and returned to sitting EOB. VC's required intermittently for safe hand placement. Pt would benefit from [facility] PT to progress safe mobility with LRAD in home environment to reduce risk of falls. Pt currently with functional limitations due to the deficits listed below (see PT Problem List). Pt will benefit from skilled PT to increase their independence and safety with mobility to allow discharge to the venue listed below.      Recommendations for follow up therapy are one component of a multi-disciplinary discharge planning process, led by the attending physician.  Recommendations may be updated based on patient status, additional functional criteria and insurance authorization.  Follow Up Recommendations Home health PT;Supervision for mobility/OOB    Equipment  Recommendations  Rolling walker with 5" wheels    Recommendations for Other Services       Precautions / Restrictions Precautions Precautions: Fall Precaution Comments: Previous R BKA. Restrictions Weight Bearing Restrictions: Yes      Mobility  Bed Mobility               General bed mobility comments: Received in recliner and left seated EOB. Patient Response: Cooperative  Transfers Overall transfer level: Needs assistance Equipment used: Rolling walker (2 wheeled) Transfers: Sit to/from Stand Sit to Stand: Min assist         General transfer comment: Physical assist due to pain in L foot. VC's for hand placement.  Ambulation/Gait Ambulation/Gait assistance: Supervision Gait Distance (Feet): 20 Feet Assistive device: Rolling walker (2 wheeled) Gait Pattern/deviations: Step-to pattern;Decreased stance time - left     General Gait Details: Heavy UE support on RW. Slow and cautious due to pain.  Stairs            Wheelchair Mobility    Modified Rankin (Stroke Patients Only)       Balance Overall balance assessment: Needs assistance Sitting-balance support: No upper extremity supported;Feet supported Sitting balance-Leahy Scale: Normal       Standing balance-Leahy Scale: Fair Standing balance comment: Relies on RW for UE support due to L foot pain.                             Pertinent Vitals/Pain Pain Assessment: 0-10 Pain Score: 10-Worst pain ever Pain Location: L calf/foot Pain Descriptors / Indicators: Pressure;Throbbing Pain Intervention(s): Limited activity within  patient's tolerance;Monitored during session;Repositioned    Home Living Family/patient expects to be discharged to:: Private residence Living Arrangements: Spouse/significant other Available Help at Discharge: Family;Friend(s);Available 24 hours/day Type of Home: House Home Access: Ramped entrance;Stairs to enter   Entrance Stairs-Number of Steps: 9 STE  with one entrance, ramped entrance for the other. B rails Home Layout: One level Home Equipment: Cane - single point;Hand held shower head;Wheelchair - manual;Other (comment) (R prosthesis for BKA) Additional Comments: Pt reprorts able to get RW if needed.    Prior Function Level of Independence: Independent with assistive device(s)         Comments: Denies falls. Ambulates with SPC household and community distances.     Hand Dominance   Dominant Hand: Right    Extremity/Trunk Assessment   Upper Extremity Assessment Upper Extremity Assessment: Generalized weakness;Defer to OT evaluation    Lower Extremity Assessment Lower Extremity Assessment: Generalized weakness;RLE deficits/detail RLE Deficits / Details: R BKA; does experience phantom limb pain    Cervical / Trunk Assessment Cervical / Trunk Assessment: Normal  Communication   Communication: No difficulties  Cognition Arousal/Alertness: Awake/alert Behavior During Therapy: WFL for tasks assessed/performed Overall Cognitive Status: Within Functional Limits for tasks assessed                                        General Comments General comments (skin integrity, edema, etc.): Swelling noted in L dorsum of foot.    Exercises Other Exercises Other Exercises: Role of PT in acute setting. D/c recs.   Assessment/Plan    PT Assessment Patient needs continued PT services  PT Problem List Decreased strength;Decreased mobility;Decreased safety awareness;Decreased activity tolerance;Decreased knowledge of use of DME       PT Treatment Interventions DME instruction;Therapeutic exercise;Gait training;Balance training;Stair training;Neuromuscular re-education;Functional mobility training;Therapeutic activities;Patient/family education    PT Goals (Current goals can be found in the Care Plan section)  Acute Rehab PT Goals Patient Stated Goal: go home PT Goal Formulation: With patient Time For Goal  Achievement: 01/12/21 Potential to Achieve Goals: Good    Frequency Min 2X/week   Barriers to discharge        Co-evaluation               AM-PAC PT "6 Clicks" Mobility  Outcome Measure Help needed turning from your back to your side while in a flat bed without using bedrails?: A Little Help needed moving from lying on your back to sitting on the side of a flat bed without using bedrails?: A Little Help needed moving to and from a bed to a chair (including a wheelchair)?: A Little Help needed standing up from a chair using your arms (e.g., wheelchair or bedside chair)?: A Lot Help needed to walk in hospital room?: A Little Help needed climbing 3-5 steps with a railing? : A Lot 6 Click Score: 16    End of Session Equipment Utilized During Treatment: Gait belt Activity Tolerance: Patient tolerated treatment well;Patient limited by pain Patient left: in bed;with call bell/phone within reach;with family/visitor present Nurse Communication: Mobility status PT Visit Diagnosis: Other abnormalities of gait and mobility (R26.89);Muscle weakness (generalized) (M62.81);Difficulty in walking, not elsewhere classified (R26.2)    Time: 0981-1914 PT Time Calculation (min) (ACUTE ONLY): 34 min   Charges:   PT Evaluation $PT Eval Moderate Complexity: 1 Mod PT Treatments $Therapeutic Activity: 23-37 mins  Delphia Grates. Fairly IV, PT, DPT Physical Therapist- [place]  [facility]  12/29/2020, 2:43 PM

## 2020-12-29 NOTE — Care Management Important Message (Signed)
Important Message  Patient Details  Name: Sylvia [name] MRN: [id number] Date of Birth: 1955/10/19   Medicare Important Message Given:  Yes     Johnell Comings 12/29/2020, 1:01 PM

## 2020-12-30 DIAGNOSIS — I70222 Atherosclerosis of native arteries of extremities with rest pain, left leg: Secondary | ICD-10-CM

## 2020-12-30 LAB — GLUCOSE, CAPILLARY
Glucose-Capillary: 231 mg/dL — ABNORMAL HIGH (ref 70–99)
Glucose-Capillary: 200 mg/dL — ABNORMAL HIGH (ref 70–99)
Glucose-Capillary: 211 mg/dL — ABNORMAL HIGH (ref 70–99)

## 2020-12-30 MED ORDER — POLYETHYLENE GLYCOL 3350 17 G PO PACK
17.0000 g | PACK | Freq: Every day | ORAL | Status: DC
  Administered 2020-12-30: 17 g via ORAL
  Filled 2020-12-30: qty 1

## 2020-12-30 MED ORDER — OXYCODONE HCL 5 MG PO TABS: 5.0000 mg | ORAL_TABLET | ORAL | 0 refills | Status: DC | PRN

## 2020-12-30 MED ORDER — CLOPIDOGREL BISULFATE 75 MG PO TABS: 75.0000 mg | ORAL_TABLET | Freq: Every day | ORAL | 3 refills | Status: DC

## 2020-12-30 MED ORDER — APIXABAN 2.5 MG PO TABS: 2.5000 mg | ORAL_TABLET | Freq: Two times a day (BID) | ORAL | 5 refills | Status: DC

## 2020-12-30 MED ORDER — PSYLLIUM 95 % PO PACK
1.0000 | PACK | Freq: Every day | ORAL | Status: DC
  Filled 2020-12-30: qty 1

## 2020-12-30 NOTE — Progress Notes (Signed)
[facility]  Daily Progress Note   Subjective  -   Doing a little better with more pain meds. To work with therapy today  Objective Vitals:   12/29/20 1941 12/29/20 2038 12/30/20 0501 12/30/20 0801  BP: 109/61 105/64 139/69 116/61  Pulse: (!) 109 (!) 104 (!) 104 94  Resp: 16  16 18   Temp: (!) 100.5 F (38.1 C) 99.2 F (37.3 C) 98.7 F (37.1 C) 98.6 F (37 C)  TempSrc: Oral Oral Oral   SpO2: 96% 93% 96% 94%  Weight:      Height:        Intake/Output Summary (Last 24 hours) at 12/30/2020 0834 Last data filed at 12/29/2020 1041 Gross per 24 hour  Intake 240 ml  Output 200 ml  Net 40 ml    PULM  CTAB CV  tachycardic VASC  Foot warm, 1+ LLE swelling, 1+ DP pulse present  Laboratory CBC    Component Value Date/Time   WBC 20.1 (H) 12/25/2020 1648   HGB 11.4 (L) 12/25/2020 1648   HGB 8.1 (L) 05/09/2019 1539   HCT 31.6 (L) 12/25/2020 1648   HCT 24.6 (L) 05/09/2019 1539   PLT 225 12/25/2020 1648   PLT 470 (H) 05/09/2019 1539    BMET    Component Value Date/Time   NA 139 12/25/2020 1648   K 3.8 12/25/2020 1648   CL 107 12/25/2020 1648   CO2 23 12/25/2020 1648   GLUCOSE 232 (H) 12/25/2020 1648   BUN 15 12/25/2020 1648   CREATININE 0.93 12/25/2020 1648   CALCIUM 9.2 12/25/2020 1648   GFRNONAA >60 12/25/2020 1648   GFRAA >60 11/23/2019 1505    Assessment/Planning: POD #4 s/p LLE revascularization  Pain control better Foot warm, swelling somewhat better Work with PT/OT today for discharge planning.  Home versus rehab today or tomorrow likely   01/23/2020  12/30/2020, 8:34 AM

## 2020-12-30 NOTE — Progress Notes (Signed)
AVS reviewed and pt verbalized understanding of all instructions. NAD noted or voiced concerns at this time. 

## 2020-12-30 NOTE — TOC Transition Note (Signed)
Transition of Care [facility]) - CM/SW Discharge Note   Patient Details  Name: Sylvia [name] MRN: [id number] Date of Birth: 11/13/1955  [facility]) CM/SW Contact:  Margarito Liner, LCSW Phone Number: 12/30/2020, 1:40 PM   Clinical Narrative:   Patient has orders to discharge home today. [facility] has accepted referral for PT and OT. No further concerns. CSW signing off.  Final next level of care: Home w Home Health Services Barriers to Discharge: Barriers Resolved   Patient Goals and CMS Choice     Choice offered to / list presented to : Patient  Discharge Placement                    Patient and family notified of of transfer: 12/30/20  Discharge Plan and Services     Post Acute Care Choice: Home Health                    HH Arranged: PT, OT [facility] Agency: [facility] Date [facility] Agency Contacted: 12/30/20   Representative spoke with at [facility] Agency: Feliberto Gottron  Social Determinants of Health (SDOH) Interventions     Readmission Risk Interventions Readmission Risk Prevention Plan 12/30/2018  Transportation Screening Complete  PCP or Specialist Appt within 3-5 Days Complete  HRI or Home Care Consult Complete  Social Work Consult for Recovery Care Planning/Counseling Complete  Palliative Care Screening Complete  Medication Review Oceanographer) Complete  Some recent data might be hidden

## 2020-12-30 NOTE — Progress Notes (Signed)
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 The patient has noninvasive study showing occlusion of her previous interventions. The patient is brought in for angiography for further evaluation and potential treatment.    PT Comments    Pt received in recliner agreeable to PT. Pt still limited by L foot pain with standing relying on pushing through RLE BKA on prosthesis to stand. MinA+1 and extra time to perform.  Progressed ambulation to 43' with supervision with RW. However pt moving very slow and cautious due to pain. VC's to progress step length bilaterally which pt was able to perform safely and expressing that gait was quicker and it felt better. Pt returned to recliner and with turning RW had small LOB requiring MinA+1 from PT to correct prior to sitting. Despite minor LOB, pt is very stable with being upright and walking and appears to be most limited from her L foot pain. Pt reporting she does have a gait belt at home and does have 24/7 family assist so if she need same level assistance at home she feels safe with family assisting. Pt reporting she ambulated further in hospital than her usual household distance so pt feeling comfortable with at home D/c after lengthy discussion with pt. Will benefit from supervision for mobility and HH PT to progress strength and ambulation with LRAD to PLOF.   Recommendations for follow up therapy are one component of a multi-disciplinary discharge planning process, led by the attending physician.  Recommendations may be updated based on patient status, additional functional criteria and insurance authorization.  Follow Up Recommendations  Home health PT;Supervision for mobility/OOB     Equipment Recommendations  Rolling walker with 5" wheels     Recommendations for Other Services       Precautions / Restrictions Precautions Precautions: Fall Precaution Comments: Previous R BKA. Restrictions Weight Bearing Restrictions: Yes     Mobility  Bed Mobility               General bed mobility comments: Received in Recliner and returned to recliner. Patient Response: Cooperative  Transfers Overall transfer level: Needs assistance Equipment used: Rolling walker (2 wheeled) Transfers: Sit to/from Stand Sit to Stand: Min assist         General transfer comment: From recliner. VC's for hand placement and increased time to stand.  Ambulation/Gait Ambulation/Gait assistance: Supervision Gait Distance (Feet): 30 Feet Assistive device: Rolling walker (2 wheeled) Gait Pattern/deviations: Step-to pattern;Decreased stance time - left     General Gait Details: Heavy UE support on RW. Slow and cautious due to pain.   Stairs             Wheelchair Mobility    Modified Rankin (Stroke Patients Only)       Balance Overall balance assessment: Needs assistance Sitting-balance support: No upper extremity supported;Feet supported Sitting balance-Leahy Scale: Normal     Standing balance support: Bilateral upper extremity supported Standing balance-Leahy Scale: Fair Standing balance comment: Relies on RW for UE support due to L foot pain.                            Cognition Arousal/Alertness: Awake/alert Behavior During Therapy: WFL for tasks assessed/performed Overall Cognitive Status: Within Functional Limits for tasks assessed  General Comments: Remains very determined despite pain in LLE and foot.      Exercises      General Comments        Pertinent Vitals/Pain Pain Assessment: 0-10 Pain Score: 10-Worst pain ever Pain Location: L calf/foot with ambulation/gravity dependent position Pain Descriptors / Indicators: Pressure;Throbbing Pain  Intervention(s): Limited activity within patient's tolerance;Monitored during session;Repositioned    Home Living                      Prior Function            PT Goals (current goals can now be found in the care plan section) Acute Rehab PT Goals Patient Stated Goal: have less pain and go home PT Goal Formulation: With patient Time For Goal Achievement: 01/12/21 Potential to Achieve Goals: Good Progress towards PT goals: Progressing toward goals    Frequency    Min 2X/week      PT Plan Current plan remains appropriate    Co-evaluation              AM-PAC PT "6 Clicks" Mobility   Outcome Measure  Help needed turning from your back to your side while in a flat bed without using bedrails?: A Little Help needed moving from lying on your back to sitting on the side of a flat bed without using bedrails?: A Little Help needed moving to and from a bed to a chair (including a wheelchair)?: A Little Help needed standing up from a chair using your arms (e.g., wheelchair or bedside chair)?: A Lot Help needed to walk in hospital room?: A Little Help needed climbing 3-5 steps with a railing? : A Lot 6 Click Score: 16    End of Session Equipment Utilized During Treatment: Gait belt Activity Tolerance: Patient tolerated treatment well;Patient limited by pain Patient left: in chair;with call bell/phone within reach;with chair alarm set;with nursing/sitter in room Nurse Communication: Mobility status PT Visit Diagnosis: Other abnormalities of gait and mobility (R26.89);Muscle weakness (generalized) (M62.81);Difficulty in walking, not elsewhere classified (R26.2)     Time: 1740-8144 PT Time Calculation (min) (ACUTE ONLY): 27 min  Charges:  $Gait Training: 23-37 mins                    Delphia Grates. Fairly IV, PT, DPT Physical Therapist- [facility]  12/30/2020, 12:28 PM

## 2020-12-30 NOTE — Discharge Instructions (Signed)
You may shower. Keep your groins clean and dry.  Please do not engage

## 2020-12-30 NOTE — TOC Initial Note (Signed)
Transition of Care [facility]) - Initial/Assessment Note    Patient Details  Name: Sylvia [name] MRN: [id number] Date of Birth: [birth year]  [facility]) CM/SW Contact:    Candie Chroman, LCSW Phone Number: 12/30/2020, 11:01 AM  Clinical Narrative:   CSW met with patient. No supports at bedside. CSW introduced role and explained that therapy recommendations would be discussed. Patient is agreeable to home health PT and OT. She worked with [facility] two years ago but they don't have PT availability for 10-15 days. [facility] can only accept if paired with a Medicare. Asked [facility] to review. Patient got a walker a year ago but it is broken. Explained that insurance will only cover a new one if it's 62 years old or older. She said she would get one at [facility] or a thrift store. Patient already has a shower chair at home. No further concerns. CSW encouraged patient to contact CSW as needed. CSW will continue to follow patient for support and facilitate return home when stable.               Expected Discharge Plan: [place] Barriers to Discharge: Continued Medical Work up   Patient Goals and CMS Choice        Expected Discharge Plan and Services Expected Discharge Plan: [place] Choice: [place] arrangements for the past 2 months: Single Family Home                                      Prior Living Arrangements/Services Living arrangements for the past 2 months: Single Family Home Lives with:: Spouse Patient language and need for interpreter reviewed:: Yes Do you feel safe going back to the place where you live?: Yes      Need for Family Participation in Patient Care: Yes (Comment) Care giver support system in place?: Yes (comment) Current home services: DME Criminal Activity/Legal Involvement Pertinent to Current Situation/Hospitalization: No - Comment as needed  Activities of Daily Living Home  Assistive Devices/Equipment: Cane (specify quad or straight), Walker (specify type), Wheelchair, Shower chair with back ADL Screening (condition at time of admission) Patient's cognitive ability adequate to safely complete daily activities?: Yes Is the patient deaf or have difficulty hearing?: No Does the patient have difficulty seeing, even when wearing glasses/contacts?: No Does the patient have difficulty concentrating, remembering, or making decisions?: No Patient able to express need for assistance with ADLs?: No Does the patient have difficulty dressing or bathing?: No Independently performs ADLs?: Yes (appropriate for developmental age) Does the patient have difficulty walking or climbing stairs?: Yes (right BKA) Weakness of Legs: Left Weakness of Arms/Hands: Both  Permission Sought/Granted Permission sought to share information with : Facility Art therapist granted to share information with : Yes, Verbal Permission Granted     Permission granted to share info w AGENCY: Home Health Agencies        Emotional Assessment Appearance:: Appears stated age Attitude/Demeanor/Rapport: Engaged, Gracious Affect (typically observed): Accepting, Appropriate, Calm, Pleasant Orientation: : Oriented to Self, Oriented to Place, Oriented to  Time, Oriented to Situation Alcohol / Substance Use: Not Applicable Psych Involvement: No (comment)  Admission diagnosis:  Ischemic leg [I99.8] Patient Active Problem List   Diagnosis Date Noted   Ischemic leg 12/25/2020   Rheumatoid arthritis of multiple sites with negative rheumatoid factor ([place]) 08/13/2020  Diabetes mellitus without complication (HCC)    GERD (gastroesophageal reflux disease)    COPD (chronic obstructive pulmonary disease) (HCC)    GIB (gastrointestinal bleeding)    HLD (hyperlipidemia)    Depression    Elevated troponin    Syncope    Elevated troponin I level    Anemia of chronic disease 05/16/2019    Dizziness    Polyp of colon    Acute upper GI bleeding 04/13/2019   Anemia associated with acute blood loss 04/13/2019   Chronic anticoagulation 04/13/2019   Hx of right BKA ([place]) 04/13/2019   Symptomatic anemia 04/13/2019   Upper GI bleed 04/13/2019   Acute GI bleeding 04/13/2019   Phantom pain after amputation of lower extremity ([place]) 01/30/2019   Melena    DU (duodenal ulcer)    DKA (diabetic ketoacidoses) 12/14/2018   Atherosclerosis of artery of extremity with rest pain ([place]) 08/28/2018   PAD (peripheral artery disease) ([place]) 07/14/2018   Abdominal pain 06/13/2018   HTN (hypertension) 06/13/2018   Non-insulin dependent type 2 diabetes mellitus ([place]) 06/13/2018   Elevated lactic acid level 06/13/2018   PCP:  [facility], [place]:   [place], [place] [address] Phone: [phone] Fax: [phone]  [facility] [address], [place] [address] Phone: [phone] Fax: [phone]  [place] [address]), [place]) [place] [address] Phone: [phone] Fax: [phone]     Social Determinants of Health (SDOH) Interventions    Readmission Risk Interventions Readmission Risk Prevention Plan 12/30/2018  Transportation Screening Complete  PCP or Specialist Appt within 3-5 Days Complete  HRI or Home Care Consult Complete  Social Work Consult for [place] Planning/Counseling Complete  Palliative Care Screening Complete  Medication Review Press photographer) Complete  Some recent data might be hidden

## 2020-12-30 NOTE — Progress Notes (Signed)
Mobility Specialist - Progress Note   12/30/20 0900  Mobility  Activity Transferred:  Bed to chair  Level of Assistance Moderate assist, patient does 50-74%  Assistive Device Front wheel walker  Distance Ambulated (ft) 3 ft  Mobility Out of bed to chair with meals  Mobility Response Tolerated well  Mobility performed by Mobility specialist  $Mobility charge 1 Mobility    Pt lying in bed upon arrival, utilizing RA. Voiced pain in LLE 8/10 that does increase to 12/10 once standing. No dizziness. ModA and extra time to stand to RW. VC for sequencing and weight-shifting to offset pain. Pt left in recliner with alarm set.    Filiberto Pinks Mobility Specialist 12/30/20, 10:03 AM

## 2020-12-30 NOTE — Progress Notes (Signed)
Pt refused VS and BS check due to discharging home.

## 2021-01-01 NOTE — Discharge Summary (Signed)
[facility]    Discharge Summary  Patient ID:  Sylvia [name] MRN: [id number] DOB/AGE: [birth year] 65 y.o.  Admit date: 12/25/2020 Discharge date: 01/01/2021 Date of Surgery: 12/26/2020 Surgeon: Surgeon(s): Annice Needy, MD  Admission Diagnosis: Ischemic leg [I99.8]  Discharge Diagnoses:  Ischemic leg [I99.8]  Secondary Diagnoses: Past Medical History:  Diagnosis Date   Anemia of chronic disease 05/16/2019   Anxiety    h/o   COPD (chronic obstructive pulmonary disease) (HCC)    Diabetes mellitus without complication (HCC)    GERD (gastroesophageal reflux disease)    Hypertension    bp under control-off meds since 2019   Procedure(s): 12/26/20: Left lower extremity angiogram  Discharged Condition: Good  HPI / Hospital Course:  Patient is a 65 y.o.female with ischemic left lower extremity status post overnight thrombolytic therapy. The patient is brought in for angiography for further evaluation and potential treatment.  Due to the limb threatening nature of the situation, angiogram was performed for attempted limb salvage. The patient is aware that if the procedure fails, amputation would be expected.  The patient also understands that even with successful revascularization, amputation may still be required due to the severity of the situation.  Risks and benefits are discussed and informed consent is obtained. On 12/26/20, the patient underwent:  Left lower extremity angiogram with intervention  A patient tolerated the procedure well was transferred to the surgical floor after the procedure.  During her brief stay, her diet was advanced, she was urinating independently, her pain was controlled with the use of p.o. and IV pain medication and she was ambulating with assistance.  She was evaluated by physical therapy and deemed appropriate for discharge home with home PT services.  This was arranged.  Day of discharge the patient was afebrile with stable vital  signs.  Physical Exam:  Alert AxO3, No acute distress Cardiovascular: Regular rate and rhythm Pulmonary pulmonary: Clear to auscultation bilaterally Abdomen: Soft, nontender nondistended Extremity: Warm distally toes  Labs: As below  Complications: None  Consults: None  Significant Diagnostic Studies: CBC Lab Results  Component Value Date   WBC 20.1 (H) 12/25/2020   HGB 11.4 (L) 12/25/2020   HCT 31.6 (L) 12/25/2020   MCV 88.8 12/25/2020   PLT 225 12/25/2020   BMET    Component Value Date/Time   NA 139 12/25/2020 1648   K 3.8 12/25/2020 1648   CL 107 12/25/2020 1648   CO2 23 12/25/2020 1648   GLUCOSE 232 (H) 12/25/2020 1648   BUN 15 12/25/2020 1648   CREATININE 0.93 12/25/2020 1648   CALCIUM 9.2 12/25/2020 1648   GFRNONAA >60 12/25/2020 1648   GFRAA >60 11/23/2019 1505   COAG Lab Results  Component Value Date   INR 1.1 12/25/2020   INR 1.0 07/25/2019   INR 1.3 (H) 12/29/2018   Disposition:  Discharge to :Home  Allergies as of 12/30/2020       Reactions   Metformin And Related Diarrhea   Penicillins Hives   Has patient had a PCN reaction causing immediate rash, facial/tongue/throat swelling, SOB or lightheadedness with hypotension: Yes Has patient had a PCN reaction causing severe rash involving mucus membranes or skin necrosis: No Has patient had a PCN reaction that required hospitalization: No Has patient had a PCN reaction occurring within the last 10 years: No If all of the above answers are "NO", then may proceed with Cephalosporin use.   Tramadol Itching        Medication List  STOP taking these medications    atorvastatin 10 MG tablet Commonly known as: LIPITOR       TAKE these medications    acetaminophen 325 MG tablet Commonly known as: TYLENOL Take 650 mg by mouth every 6 (six) hours as needed.   albuterol 108 (90 Base) MCG/ACT inhaler Commonly known as: VENTOLIN HFA Inhale 2 puffs into the lungs every 6 (six) hours as  needed for wheezing or shortness of breath.   apixaban 2.5 MG Tabs tablet Commonly known as: ELIQUIS Take 1 tablet (2.5 mg total) by mouth 2 (two) times daily.   cetirizine 10 MG tablet Commonly known as: ZYRTEC Take 10 mg by mouth daily.   clopidogrel 75 MG tablet Commonly known as: PLAVIX Take 1 tablet (75 mg total) by mouth daily.   docusate sodium 100 MG capsule Commonly known as: COLACE Take 100 mg by mouth 2 (two) times daily.   DULoxetine 30 MG capsule Commonly known as: CYMBALTA Take 30 mg by mouth daily.   famotidine 20 MG tablet Commonly known as: Pepcid Take 1 tablet (20 mg total) by mouth 2 (two) times daily.   ferrous sulfate 325 (65 FE) MG tablet Take 325 mg by mouth daily.   folic acid 1 MG tablet Commonly known as: FOLVITE Take 1 mg by mouth daily.   gabapentin 300 MG capsule Commonly known as: NEURONTIN Take 2 Capsules (600 mg) at Bedtime and 1 capsule (300 mg) in the afternoon   Jardiance 10 MG Tabs tablet Generic drug: empagliflozin Take 10 mg by mouth daily.   ketorolac 0.4 % Soln Commonly known as: ACULAR INSTILL 1 DROP INTO AFFECTED EYE 4 TIMES DAILY. BEGINNING AFTER SURGERY.   latanoprost 0.005 % ophthalmic solution Commonly known as: XALATAN 1 drop at bedtime.   lisinopril 10 MG tablet Commonly known as: ZESTRIL Take 1 tablet (10 mg total) by mouth daily.   melatonin 1 MG Tabs tablet Take 1 tablet by mouth at bedtime.   methotrexate 2.5 MG tablet Commonly known as: RHEUMATREX Take 15 mg by mouth once a week.   moxifloxacin 0.5 % ophthalmic solution Commonly known as: VIGAMOX 1 drop 3 (three) times daily.   Na Sulfate-K Sulfate-Mg Sulf 17.5-3.13-1.6 GM/177ML Soln At 5 PM the day before procedure take 1 bottle and 5 hours before procedure take 1 bottle.   oxyCODONE 5 MG immediate release tablet Commonly known as: Oxy IR/ROXICODONE Take 1-2 tablets (5-10 mg total) by mouth every 4 (four) hours as needed for severe pain or  moderate pain. What changed:  how much to take reasons to take this   prednisoLONE acetate 1 % ophthalmic suspension Commonly known as: PRED FORTE INSTILL 1 DROP 4 TIMES DAILY AS DIRECTED. BEGINNING AFTER SURGERY   rosuvastatin 5 MG tablet Commonly known as: CRESTOR Take 1 tablet (5 mg total) by mouth daily at 6 PM.   sitaGLIPtin 100 MG tablet Commonly known as: JANUVIA Take 100 mg by mouth daily.   vitamin B-12 500 MCG tablet Commonly known as: CYANOCOBALAMIN Take 1 tablet (500 mcg total) by mouth daily.   WOMENS MULTIVITAMIN PO Take 1 tablet by mouth daily.       Verbal and written Discharge instructions given to the patient. Wound care per Discharge AVS  Follow-up Information     Annice Needy, MD Follow up on 01/30/2021.   Specialties: Vascular Surgery, Radiology, Interventional Cardiology Why: Can see Dew or Vivia [place]. Will need ABI with visit. 4:30pm appointment Contact information: [address] [place]  [phone]         [facility].   Specialty: Home Health Services Why: They will follow up with you for your home health therapy needs.               SignedTonette Lederer, PA-C 01/01/2021, 11:27 AM

## 2021-01-06 ENCOUNTER — Other Ambulatory Visit (INDEPENDENT_AMBULATORY_CARE_PROVIDER_SITE_OTHER): Payer: Self-pay | Admitting: Nurse Practitioner

## 2021-01-06 ENCOUNTER — Telehealth (INDEPENDENT_AMBULATORY_CARE_PROVIDER_SITE_OTHER): Payer: Self-pay

## 2021-01-06 DIAGNOSIS — I739 Peripheral vascular disease, unspecified: Secondary | ICD-10-CM

## 2021-01-06 DIAGNOSIS — Z9889 Other specified postprocedural states: Secondary | ICD-10-CM

## 2021-01-06 MED ORDER — HYDROCODONE-ACETAMINOPHEN 5-325 MG PO TABS: 1.0000 | ORAL_TABLET | ORAL | 0 refills | Status: DC | PRN

## 2021-01-06 NOTE — Telephone Encounter (Signed)
We can have her come in sooner with studies.  It is very likely that the patient is suffering from reperfusion swelling.  Reperfusion swelling occurs we have gone for a little bit of time without blood flow.  Your muscles and tissues are essentially absorbing extra blood and it causes excessive swelling.  The best thing she can do to help this is to elevate her lower extremity.  If she has compression socks this will help as well.  I will also send in something for her for pain medication.

## 2021-01-06 NOTE — Telephone Encounter (Signed)
Returning call to the patient from message left on nurse line

## 2021-01-06 NOTE — Telephone Encounter (Signed)
Patient was made aware with medical advice and appointment has been moved up to 01/16/21

## 2021-01-14 ENCOUNTER — Other Ambulatory Visit (INDEPENDENT_AMBULATORY_CARE_PROVIDER_SITE_OTHER): Payer: Self-pay | Admitting: Vascular Surgery

## 2021-01-14 DIAGNOSIS — I70222 Atherosclerosis of native arteries of extremities with rest pain, left leg: Secondary | ICD-10-CM

## 2021-01-14 DIAGNOSIS — Z9862 Peripheral vascular angioplasty status: Secondary | ICD-10-CM

## 2021-01-16 ENCOUNTER — Ambulatory Visit (INDEPENDENT_AMBULATORY_CARE_PROVIDER_SITE_OTHER): Payer: Medicare Other | Admitting: Nurse Practitioner

## 2021-01-16 ENCOUNTER — Encounter (INDEPENDENT_AMBULATORY_CARE_PROVIDER_SITE_OTHER): Payer: Medicare Other

## 2021-01-20 ENCOUNTER — Other Ambulatory Visit: Payer: Self-pay

## 2021-01-20 ENCOUNTER — Ambulatory Visit (INDEPENDENT_AMBULATORY_CARE_PROVIDER_SITE_OTHER): Payer: Medicare Other | Admitting: Nurse Practitioner

## 2021-01-20 ENCOUNTER — Encounter (INDEPENDENT_AMBULATORY_CARE_PROVIDER_SITE_OTHER): Payer: Self-pay | Admitting: Nurse Practitioner

## 2021-01-20 ENCOUNTER — Ambulatory Visit (INDEPENDENT_AMBULATORY_CARE_PROVIDER_SITE_OTHER): Payer: Medicare Other

## 2021-01-20 VITALS — BP 135/72 | HR 95 | Resp 16 | Wt 139.0 lb

## 2021-01-20 DIAGNOSIS — F17200 Nicotine dependence, unspecified, uncomplicated: Secondary | ICD-10-CM

## 2021-01-20 DIAGNOSIS — I10 Essential (primary) hypertension: Secondary | ICD-10-CM

## 2021-01-20 DIAGNOSIS — Z9889 Other specified postprocedural states: Secondary | ICD-10-CM | POA: Diagnosis not present

## 2021-01-20 DIAGNOSIS — Z9862 Peripheral vascular angioplasty status: Secondary | ICD-10-CM | POA: Diagnosis not present

## 2021-01-20 DIAGNOSIS — I70222 Atherosclerosis of native arteries of extremities with rest pain, left leg: Secondary | ICD-10-CM

## 2021-01-20 DIAGNOSIS — E785 Hyperlipidemia, unspecified: Secondary | ICD-10-CM | POA: Diagnosis not present

## 2021-01-20 DIAGNOSIS — I739 Peripheral vascular disease, unspecified: Secondary | ICD-10-CM | POA: Diagnosis not present

## 2021-01-26 ENCOUNTER — Encounter (INDEPENDENT_AMBULATORY_CARE_PROVIDER_SITE_OTHER): Payer: Self-pay

## 2021-01-26 ENCOUNTER — Other Ambulatory Visit: Payer: Self-pay

## 2021-01-26 ENCOUNTER — Encounter (INDEPENDENT_AMBULATORY_CARE_PROVIDER_SITE_OTHER): Payer: Self-pay | Admitting: Nurse Practitioner

## 2021-01-26 ENCOUNTER — Ambulatory Visit (INDEPENDENT_AMBULATORY_CARE_PROVIDER_SITE_OTHER): Payer: Medicare Other | Admitting: Nurse Practitioner

## 2021-01-26 ENCOUNTER — Encounter (INDEPENDENT_AMBULATORY_CARE_PROVIDER_SITE_OTHER): Payer: Medicare Other

## 2021-01-26 VITALS — BP 130/70 | HR 90 | Resp 16 | Wt 140.0 lb

## 2021-01-26 DIAGNOSIS — M7989 Other specified soft tissue disorders: Secondary | ICD-10-CM

## 2021-01-26 NOTE — Progress Notes (Signed)
History of Present Illness  There is no documented history at this time  Assessments & Plan   There are no diagnoses linked to this encounter.    Additional instructions  Subjective:  Patient presents with venous ulcer of the Left lower extremity.    Procedure:  3 layer unna wrap was placed Left lower extremity.   Plan:   Follow up in one week.  

## 2021-01-26 NOTE — Progress Notes (Signed)
Subjective:    Patient ID: Sylvia [name], female    DOB: 1955-06-20, 65 y.o.   MRN: [id number] Chief Complaint  Patient presents with   Follow-up    [facility] 68month post le angio intervention    Sylvia [name] is a 65 year old female that returns to the office for followup and review status post angiogram with intervention.  Patient underwent left thrombectomy to an occluded SFA on 12/30/2020.  Prior to this ischemic event the patient had studies on 12/10/2020 which showed good perfusion.  This was an acute event.  The patient notes improvement in the lower extremity symptoms. No interval shortening of the patient's claudication distance or rest pain symptoms. Previous wounds have now healed.  No new ulcers or wounds have occurred since the last visit.  There have been no significant changes to the patient's overall health care.  The patient denies amaurosis fugax or recent TIA symptoms. There are no recent neurological changes noted. The patient denies history of DVT, PE or superficial thrombophlebitis. The patient denies recent episodes of angina or shortness of breath.   ABI's Rt=n/a and Lt=1.21  (previous ABI's Rt=n/a and Lt=0.96) Duplex US of the lower extremity shows biphasic waveforms with good toe waveforms.   Review of Systems  Cardiovascular:  Positive for leg swelling.  Musculoskeletal:  Positive for gait problem.  All other systems reviewed and are negative.     Objective:   Physical Exam Vitals reviewed.  HENT:     Head: Normocephalic.  Cardiovascular:     Rate and Rhythm: Normal rate.     Pulses:          Dorsalis pedis pulses are 1+ on the left side.       Posterior tibial pulses are 1+ on the left side.  Pulmonary:     Effort: Pulmonary effort is normal.  Musculoskeletal:     Right Lower Extremity: Right leg is amputated below knee.  Skin:    General: Skin is warm and dry.  Neurological:     Mental Status: She is alert and oriented to person, place, and  time.     Gait: Gait abnormal.  Psychiatric:        Mood and Affect: Mood normal.        Behavior: Behavior normal.        Thought Content: Thought content normal.        Judgment: Judgment normal.    BP 135/72 (BP Location: Right Arm)   Pulse 95   Resp 16   Wt 139 lb (63 kg)   BMI 22.44 kg/m   Past Medical History:  Diagnosis Date   Anemia of chronic disease 05/16/2019   Anxiety    h/o   COPD (chronic obstructive pulmonary disease) (HCC)    Diabetes mellitus without complication (HCC)    GERD (gastroesophageal reflux disease)    Hypertension    bp under control-off meds since 2019    Social History   Socioeconomic History   Marital status: Divorced    Spouse name: Not on file   Number of children: 2   Years of education: Not on file   Highest education level: Not on file  Occupational History   Not on file  Tobacco Use   Smoking status: Some Days    Packs/day: 0.25    Years: 45.00    Pack years: 11.25    Types: Cigarettes   Smokeless tobacco: Never   Tobacco comments:    quit  Vaping Use  Vaping Use: Never used  Substance and Sexual Activity   Alcohol use: No   Drug use: Not Currently    Comment: last used in April 2021 per patient   Sexual activity: Not Currently  Other Topics Concern   Not on file  Social History Narrative   5 grandchildren and 5 great grandchildren    2 grandchildren live with Pt. (25 & 35 y.o.)   Social Determinants of Health   Financial Resource Strain: Not on file  Food Insecurity: Not on file  Transportation Needs: Not on file  Physical Activity: Not on file  Stress: Not on file  Social Connections: Not on file  Intimate Partner Violence: Not on file    Past Surgical History:  Procedure Laterality Date   ABDOMINAL HYSTERECTOMY     AMPUTATION Right 12/27/2018   Procedure: AMPUTATION BELOW KNEE;  Surgeon: Annice Needy, MD;  Location: [facility];  Service: General;  Laterality: Right;   CATARACT EXTRACTION W/PHACO Right  03/27/2020   Procedure: CATARACT EXTRACTION PHACO AND INTRAOCULAR LENS PLACEMENT (IOC) RIGHT DIABETIC 7.54 00:52.5;  Surgeon: Galen Manila, MD;  Location: [facility];  Service: Ophthalmology;  Laterality: Right;   CATARACT EXTRACTION W/PHACO Left 05/20/2020   Procedure: CATARACT EXTRACTION PHACO AND INTRAOCULAR LENS PLACEMENT (IOC) LEFT DIABETIC 4.95 00:37.6;  Surgeon: Galen Manila, MD;  Location: [facility];  Service: Ophthalmology;  Laterality: Left;  Diabetic - oral meds COVID + 04-24-20   CHOLECYSTECTOMY     COLONOSCOPY WITH PROPOFOL N/A 04/16/2019   Procedure: COLONOSCOPY WITH PROPOFOL;  Surgeon: Pasty Spillers, MD;  Location: [facility];  Service: Endoscopy;  Laterality: N/A;   COLONOSCOPY WITH PROPOFOL N/A 01/16/2020   Procedure: COLONOSCOPY WITH PROPOFOL;  Surgeon: Pasty Spillers, MD;  Location: [facility];  Service: Endoscopy;  Laterality: N/A;   ESOPHAGOGASTRODUODENOSCOPY (EGD) WITH PROPOFOL N/A 06/15/2018   Procedure: ESOPHAGOGASTRODUODENOSCOPY (EGD) WITH PROPOFOL;  Surgeon: Toledo, Boykin Nearing, MD;  Location: [facility];  Service: Gastroenterology;  Laterality: N/A;   ESOPHAGOGASTRODUODENOSCOPY (EGD) WITH PROPOFOL N/A 01/03/2019   Procedure: ESOPHAGOGASTRODUODENOSCOPY (EGD) WITH PROPOFOL;  Surgeon: Midge Minium, MD;  Location: [facility];  Service: Endoscopy;  Laterality: N/A;   GIVENS CAPSULE STUDY  04/16/2019   Procedure: GIVENS CAPSULE STUDY;  Surgeon: Pasty Spillers, MD;  Location: [facility];  Service: Endoscopy;;   GIVENS CAPSULE STUDY N/A 06/25/2019   Procedure: GIVENS CAPSULE STUDY;  Surgeon: Wyline Mood, MD;  Location: [facility];  Service: Gastroenterology;  Laterality: N/A;   LOWER EXTREMITY ANGIOGRAPHY Right 08/21/2018   Procedure: LOWER EXTREMITY ANGIOGRAPHY;  Surgeon: Annice Needy, MD;  Location: [facility];  Service: Cardiovascular;  Laterality: Right;   LOWER EXTREMITY ANGIOGRAPHY Left 08/28/2018    Procedure: LOWER EXTREMITY ANGIOGRAPHY;  Surgeon: Annice Needy, MD;  Location: [facility];  Service: Cardiovascular;  Laterality: Left;   LOWER EXTREMITY ANGIOGRAPHY Right 08/28/2018   Procedure: Lower Extremity Angiography;  Surgeon: Annice Needy, MD;  Location: [facility];  Service: Cardiovascular;  Laterality: Right;   LOWER EXTREMITY ANGIOGRAPHY Right 12/18/2018   Procedure: Lower Extremity Angiography;  Surgeon: Annice Needy, MD;  Location: [facility];  Service: Cardiovascular;  Laterality: Right;   LOWER EXTREMITY ANGIOGRAPHY Right 12/18/2018   Procedure: Lower Extremity Angiography;  Surgeon: Annice Needy, MD;  Location: [facility];  Service: Cardiovascular;  Laterality: Right;   LOWER EXTREMITY ANGIOGRAPHY Left 12/21/2018   Procedure: Lower Extremity Angiography;  Surgeon: Annice Needy,  MD;  Location: [facility];  Service: Cardiovascular;  Laterality: Left;   LOWER EXTREMITY ANGIOGRAPHY Right 12/21/2018   Procedure: Lower Extremity Angiography;  Surgeon: Annice Needy, MD;  Location: [facility];  Service: Cardiovascular;  Laterality: Right;   LOWER EXTREMITY ANGIOGRAPHY Left 12/25/2020   Procedure: LOWER EXTREMITY ANGIOGRAPHY;  Surgeon: Annice Needy, MD;  Location: [facility];  Service: Cardiovascular;  Laterality: Left;   LOWER EXTREMITY INTERVENTION N/A 12/22/2018   Procedure: LOWER EXTREMITY INTERVENTION;  Surgeon: Annice Needy, MD;  Location: [facility];  Service: Cardiovascular;  Laterality: N/A;   LOWER EXTREMITY INTERVENTION Left 12/26/2020   Procedure: LOWER EXTREMITY INTERVENTION;  Surgeon: Annice Needy, MD;  Location: [facility];  Service: Cardiovascular;  Laterality: Left;    Family History  Problem Relation Age of Onset   Breast cancer Mother 51    Allergies  Allergen Reactions   Metformin And Related Diarrhea   Penicillins Hives    Has patient had a PCN reaction causing immediate rash,  facial/tongue/throat swelling, SOB or lightheadedness with hypotension: Yes Has patient had a PCN reaction causing severe rash involving mucus membranes or skin necrosis: No Has patient had a PCN reaction that required hospitalization: No Has patient had a PCN reaction occurring within the last 10 years: No If all of the above answers are "NO", then may proceed with Cephalosporin use.   Tramadol Itching    CBC Latest Ref Rng & Units 12/25/2020 11/26/2020 05/28/2020  WBC 4.0 - 10.5 K/uL 20.1(H) 8.6 8.8  Hemoglobin 12.0 - 15.0 g/dL 11.4(L) 11.1(L) 11.1(L)  Hematocrit 36.0 - 46.0 % 31.6(L) 32.0(L) 31.1(L)  Platelets 150 - 400 K/uL 225 332 340      CMP     Component Value Date/Time   NA 139 12/25/2020 1648   K 3.8 12/25/2020 1648   CL 107 12/25/2020 1648   CO2 23 12/25/2020 1648   GLUCOSE 232 (H) 12/25/2020 1648   BUN 15 12/25/2020 1648   CREATININE 0.93 12/25/2020 1648   CALCIUM 9.2 12/25/2020 1648   PROT 7.4 12/25/2020 1648   ALBUMIN 3.9 12/25/2020 1648   AST 21 12/25/2020 1648   ALT 14 12/25/2020 1648   ALKPHOS 92 12/25/2020 1648   BILITOT 0.8 12/25/2020 1648   GFRNONAA >60 12/25/2020 1648   GFRAA >60 11/23/2019 1505     VAS [place] ABI WITH/WO TBI  Result Date: 12/30/2020  LOWER EXTREMITY DOPPLER STUDY Patient Name:  ALEXSA FLAUM  Date of Exam:   12/10/2020 Medical Rec #: [id number]           Accession #:    [id number] Date of Birth: 06-19-1955            Patient Gender: F Patient Age:   26 years Exam Location:  [facility] Procedure:      VAS [place] ABI WITH/WO TBI Referring Phys: Barbara Cower DEW --------------------------------------------------------------------------------  Indications: Peripheral artery disease, and Right leg weakness and right BKA.  Vascular Interventions: 08/21/18: Right SFA/popliteal artery PTA/stent;                         08/28/18: Left EIA & SFA/popliteal artery stents;                         08/28/18: Right popliteal, TP trunk & peroneal artery  thrombectomies/PTAs;                         12/27/18: Right BKA;. Comparison Study: 06/10/2020 Performing Technologist: Salvadore Farber RVT  Examination Guidelines: A complete evaluation includes at minimum, Doppler waveform signals and systolic blood pressure reading at the level of bilateral brachial, anterior tibial, and posterior tibial arteries, when vessel segments are accessible. Bilateral testing is considered an integral part of a complete examination. Photoelectric Plethysmograph (PPG) waveforms and toe systolic pressure readings are included as required and additional duplex testing as needed. Limited examinations for reoccurring indications may be performed as noted.  ABI Findings: +--------+------------------+-----+--------+--------+ Right   Rt Pressure (mmHg)IndexWaveformComment  +--------+------------------+-----+--------+--------+ [id number]                                     +--------+------------------+-----+--------+--------+ +---------+------------------+-----+--------+-------+ Left     Lt Pressure (mmHg)IndexWaveformComment +---------+------------------+-----+--------+-------+ ATA      119                    biphasic.96     +---------+------------------+-----+--------+-------+ PTA      119               0.96 biphasic        +---------+------------------+-----+--------+-------+ Great Toe80                0.65 Abnormal        +---------+------------------+-----+--------+-------+ +-------+-----------+-----------+------------+------------+ ABI/TBIToday's ABIToday's TBIPrevious ABIPrevious TBI +-------+-----------+-----------+------------+------------+ Right  BKA                   BKA                      +-------+-----------+-----------+------------+------------+ Left   .96        .65        .76         .52          +-------+-----------+-----------+------------+------------+ Left ABIs and TBIs appear increased compared to prior study  on 05/2020.  Summary: Right: BKA. Left: Resting left ankle-brachial index is within normal range. No evidence of significant left lower extremity arterial disease. The left toe-brachial index is abnormal.  *See table(s) above for measurements and observations.  Electronically signed by [name] MD on 12/30/2020 at 1:00:22 PM.    Final        Assessment & Plan:   1. Peripheral arterial disease with history of revascularization (HCC) Recommend:  The patient is status post successful angiogram with intervention.  The patient reports that the claudication symptoms and leg pain is essentially gone.   The patient denies lifestyle limiting changes at this point in time.  She does continue to have reperfusion swelling.  No further invasive studies, angiography or surgery at this time The patient should continue walking and begin a more formal exercise program.  The patient should continue antiplatelet therapy and aggressive treatment of the lipid abnormalities  Smoking cessation was again discussed  The patient should continue wearing graduated compression socks 10-15 mmHg strength to control the mild edema.  Patient should undergo noninvasive studies as ordered. The patient will follow up with me after the studies.    2. Essential hypertension Continue antihypertensive medications as already ordered, these medications have been reviewed and there are no changes at this time.   3. Hyperlipidemia, unspecified hyperlipidemia type Continue statin as ordered and reviewed, no changes  at this time   4. Tobacco use disorder Smoking cessation was discussed, 3-10 minutes spent on this topic specifically the patient is also reminded that continued smoking will likely result in worsening atherosclerotic disease which could again lead to a subsequent ischemic episode.   Current Outpatient Medications on File Prior to Visit  Medication Sig Dispense Refill   apixaban (ELIQUIS) 2.5 MG TABS tablet Take 1  tablet (2.5 mg total) by mouth 2 (two) times daily. 60 tablet 5   clopidogrel (PLAVIX) 75 MG tablet Take 1 tablet (75 mg total) by mouth daily. 90 tablet 3   docusate sodium (COLACE) 100 MG capsule Take 100 mg by mouth 2 (two) times daily.     ferrous sulfate 325 (65 FE) MG tablet Take 325 mg by mouth daily.      folic acid (FOLVITE) 1 MG tablet Take 1 mg by mouth daily.     gabapentin (NEURONTIN) 300 MG capsule Take 2 Capsules (600 mg) at Bedtime and 1 capsule (300 mg) in the afternoon 90 capsule 2   HYDROcodone-acetaminophen (NORCO/VICODIN) 5-325 MG tablet Take 1 tablet by mouth every 4 (four) hours as needed for moderate pain. 20 tablet 0   JARDIANCE 10 MG TABS tablet Take 10 mg by mouth daily.     lisinopril (ZESTRIL) 10 MG tablet Take 1 tablet (10 mg total) by mouth daily. 30 tablet 1   Melatonin 1 MG TABS Take 1 tablet by mouth at bedtime.     methotrexate (RHEUMATREX) 2.5 MG tablet Take 15 mg by mouth once a week.     Multiple Vitamins-Minerals (WOMENS MULTIVITAMIN PO) Take 1 tablet by mouth daily.     rosuvastatin (CRESTOR) 5 MG tablet Take 1 tablet (5 mg total) by mouth daily at 6 PM. 30 tablet 1   sitaGLIPtin (JANUVIA) 100 MG tablet Take 100 mg by mouth daily.     vitamin B-12 (CYANOCOBALAMIN) 500 MCG tablet Take 1 tablet (500 mcg total) by mouth daily. 90 tablet 1   acetaminophen (TYLENOL) 325 MG tablet Take 650 mg by mouth every 6 (six) hours as needed. (Patient not taking: No sig reported)     albuterol (PROVENTIL HFA;VENTOLIN HFA) 108 (90 Base) MCG/ACT inhaler Inhale 2 puffs into the lungs every 6 (six) hours as needed for wheezing or shortness of breath. (Patient not taking: No sig reported) 1 Inhaler 2   cetirizine (ZYRTEC) 10 MG tablet Take 10 mg by mouth daily. (Patient not taking: No sig reported)     DULoxetine (CYMBALTA) 30 MG capsule Take 30 mg by mouth daily. (Patient not taking: No sig reported)     famotidine (PEPCID) 20 MG tablet Take 1 tablet (20 mg total) by mouth 2  (two) times daily. 60 tablet 1   ketorolac (ACULAR) 0.4 % SOLN INSTILL 1 DROP INTO AFFECTED EYE 4 TIMES DAILY. BEGINNING AFTER SURGERY. (Patient not taking: Reported on 12/25/2020)     latanoprost (XALATAN) 0.005 % ophthalmic solution 1 drop at bedtime. (Patient not taking: No sig reported)     moxifloxacin (VIGAMOX) 0.5 % ophthalmic solution 1 drop 3 (three) times daily. (Patient not taking: No sig reported)     Na Sulfate-K Sulfate-Mg Sulf 17.5-3.13-1.6 GM/177ML SOLN At 5 PM the day before procedure take 1 bottle and 5 hours before procedure take 1 bottle. (Patient not taking: No sig reported) 708 mL 0   prednisoLONE acetate (PRED FORTE) 1 % ophthalmic suspension INSTILL 1 DROP 4 TIMES DAILY AS DIRECTED. BEGINNING AFTER SURGERY (Patient not taking:  No sig reported)     No current facility-administered medications on file prior to visit.    There are no Patient Instructions on file for this visit. No follow-ups on file.   Georgiana Spinner, NP

## 2021-01-27 ENCOUNTER — Encounter (INDEPENDENT_AMBULATORY_CARE_PROVIDER_SITE_OTHER): Payer: Self-pay | Admitting: Nurse Practitioner

## 2021-01-30 ENCOUNTER — Encounter (INDEPENDENT_AMBULATORY_CARE_PROVIDER_SITE_OTHER): Payer: Medicare Other

## 2021-01-30 ENCOUNTER — Ambulatory Visit (INDEPENDENT_AMBULATORY_CARE_PROVIDER_SITE_OTHER): Payer: Medicare Other | Admitting: Nurse Practitioner

## 2021-02-02 ENCOUNTER — Encounter (INDEPENDENT_AMBULATORY_CARE_PROVIDER_SITE_OTHER): Payer: Self-pay

## 2021-02-02 ENCOUNTER — Ambulatory Visit (INDEPENDENT_AMBULATORY_CARE_PROVIDER_SITE_OTHER): Payer: Medicare Other | Admitting: Nurse Practitioner

## 2021-02-02 ENCOUNTER — Other Ambulatory Visit: Payer: Self-pay

## 2021-02-02 VITALS — BP 124/69 | HR 94 | Resp 16 | Wt 141.6 lb

## 2021-02-02 DIAGNOSIS — M7989 Other specified soft tissue disorders: Secondary | ICD-10-CM | POA: Diagnosis not present

## 2021-02-02 NOTE — Progress Notes (Signed)
History of Present Illness  There is no documented history at this time  Assessments & Plan   There are no diagnoses linked to this encounter.    Additional instructions  Subjective:  Patient presents with venous ulcer of the Left lower extremity.    Procedure:  3 layer unna wrap was placed Left lower extremity.   Plan:   Follow up in one week.  

## 2021-02-05 ENCOUNTER — Encounter (INDEPENDENT_AMBULATORY_CARE_PROVIDER_SITE_OTHER): Payer: Self-pay | Admitting: Nurse Practitioner

## 2021-02-09 ENCOUNTER — Ambulatory Visit (INDEPENDENT_AMBULATORY_CARE_PROVIDER_SITE_OTHER): Payer: Medicare Other | Admitting: Nurse Practitioner

## 2021-02-09 ENCOUNTER — Other Ambulatory Visit: Payer: Self-pay

## 2021-02-09 VITALS — BP 129/75 | HR 98 | Ht 67.0 in | Wt 135.0 lb

## 2021-02-09 DIAGNOSIS — M7989 Other specified soft tissue disorders: Secondary | ICD-10-CM

## 2021-02-09 NOTE — Progress Notes (Signed)
History of Present Illness  There is no documented history at this time  Assessments & Plan   There are no diagnoses linked to this encounter.    Additional instructions  Subjective:  Patient presents with venous ulcer of the Left lower extremity.    Procedure:  3 layer unna wrap was placed Left lower extremity.   Plan:   Follow up in one week.  

## 2021-02-11 ENCOUNTER — Encounter (INDEPENDENT_AMBULATORY_CARE_PROVIDER_SITE_OTHER): Payer: Self-pay | Admitting: Nurse Practitioner

## 2021-02-16 ENCOUNTER — Ambulatory Visit (INDEPENDENT_AMBULATORY_CARE_PROVIDER_SITE_OTHER): Payer: Medicare Other | Admitting: Nurse Practitioner

## 2021-02-16 ENCOUNTER — Other Ambulatory Visit: Payer: Self-pay

## 2021-02-16 ENCOUNTER — Encounter (INDEPENDENT_AMBULATORY_CARE_PROVIDER_SITE_OTHER): Payer: Self-pay | Admitting: Nurse Practitioner

## 2021-02-16 VITALS — BP 130/79 | HR 98 | Ht 69.0 in | Wt 139.0 lb

## 2021-02-16 DIAGNOSIS — I739 Peripheral vascular disease, unspecified: Secondary | ICD-10-CM | POA: Diagnosis not present

## 2021-02-16 DIAGNOSIS — M7989 Other specified soft tissue disorders: Secondary | ICD-10-CM

## 2021-02-16 DIAGNOSIS — I10 Essential (primary) hypertension: Secondary | ICD-10-CM

## 2021-02-16 DIAGNOSIS — Z9889 Other specified postprocedural states: Secondary | ICD-10-CM

## 2021-02-16 MED ORDER — GABAPENTIN 300 MG PO CAPS: ORAL_CAPSULE | ORAL | 3 refills | Status: DC

## 2021-02-16 NOTE — Progress Notes (Signed)
Subjective:    Patient ID: Sylvia [name], female    DOB: December 18, 1955, 65 y.o.   MRN: [id number] Chief Complaint  Patient presents with   Follow-up    4 wk Lt unna boot check    Sylvia [name] is a 65 year old female that presents today for follow-up evaluation of lower extremity edema following recent left lower extremity angiogram.  The swelling is drastically decreased except for the ankle area.  However the patient does have a history of rheumatoid arthritis.  She still has some persistent numbness.  Hand-held Doppler reveals strong monophasic waveforms as well as good toe waveforms down her foot.  Overall the patient has improved.   Review of Systems  Cardiovascular:  Positive for leg swelling.  Neurological:  Positive for numbness.  All other systems reviewed and are negative.     Objective:   Physical Exam Vitals reviewed.  HENT:     Head: Normocephalic.  Cardiovascular:     Rate and Rhythm: Normal rate.     Pulses:          Dorsalis pedis pulses are detected w/ Doppler on the left side.       Posterior tibial pulses are detected w/ Doppler on the left side.  Pulmonary:     Effort: Pulmonary effort is normal.  Musculoskeletal:     Left lower leg: 1+ Edema present.  Neurological:     Mental Status: She is alert and oriented to person, place, and time.  Psychiatric:        Mood and Affect: Mood normal.        Behavior: Behavior normal.        Thought Content: Thought content normal.        Judgment: Judgment normal.    BP 130/79   Pulse 98   Ht 5\' 9"  (1.753 m)   Wt 139 lb (63 kg)   BMI 20.53 kg/m   Past Medical History:  Diagnosis Date   Anemia of chronic disease 05/16/2019   Anxiety    h/o   COPD (chronic obstructive pulmonary disease) (HCC)    Diabetes mellitus without complication (HCC)    GERD (gastroesophageal reflux disease)    Hypertension    bp under control-off meds since 2019    Social History   Socioeconomic History   Marital  status: Divorced    Spouse name: Not on file   Number of children: 2   Years of education: Not on file   Highest education level: Not on file  Occupational History   Not on file  Tobacco Use   Smoking status: Some Days    Packs/day: 0.25    Years: 45.00    Pack years: 11.25    Types: Cigarettes   Smokeless tobacco: Never   Tobacco comments:    quit  Vaping Use   Vaping Use: Never used  Substance and Sexual Activity   Alcohol use: No   Drug use: Not Currently    Comment: last used in April 2021 per patient   Sexual activity: Not Currently  Other Topics Concern   Not on file  Social History Narrative   5 grandchildren and 5 great grandchildren    2 grandchildren live with Pt. (25 & 58 y.o.)   Social Determinants of Health   Financial Resource Strain: Not on file  Food Insecurity: Not on file  Transportation Needs: Not on file  Physical Activity: Not on file  Stress: Not on file  Social Connections: Not on file  Intimate Partner Violence: Not on file    Past Surgical History:  Procedure Laterality Date   ABDOMINAL HYSTERECTOMY     AMPUTATION Right 12/27/2018   Procedure: AMPUTATION BELOW KNEE;  Surgeon: Algernon Huxley, MD;  Location: [facility];  Service: General;  Laterality: Right;   CATARACT EXTRACTION W/PHACO Right 03/27/2020   Procedure: CATARACT EXTRACTION PHACO AND INTRAOCULAR LENS PLACEMENT (IOC) RIGHT DIABETIC 7.54 00:52.5;  Surgeon: Birder Robson, MD;  Location: [place];  Service: Ophthalmology;  Laterality: Right;   CATARACT EXTRACTION W/PHACO Left 05/20/2020   Procedure: CATARACT EXTRACTION PHACO AND INTRAOCULAR LENS PLACEMENT (IOC) LEFT DIABETIC 4.95 00:37.6;  Surgeon: Birder Robson, MD;  Location: [place];  Service: Ophthalmology;  Laterality: Left;  Diabetic - oral meds COVID + 04-24-20   CHOLECYSTECTOMY     COLONOSCOPY WITH PROPOFOL N/A 04/16/2019   Procedure: COLONOSCOPY WITH PROPOFOL;  Surgeon: Virgel Manifold, MD;   Location: [facility];  Service: Endoscopy;  Laterality: N/A;   COLONOSCOPY WITH PROPOFOL N/A 01/16/2020   Procedure: COLONOSCOPY WITH PROPOFOL;  Surgeon: Virgel Manifold, MD;  Location: [facility];  Service: Endoscopy;  Laterality: N/A;   ESOPHAGOGASTRODUODENOSCOPY (EGD) WITH PROPOFOL N/A 06/15/2018   Procedure: ESOPHAGOGASTRODUODENOSCOPY (EGD) WITH PROPOFOL;  Surgeon: Toledo, Benay Pike, MD;  Location: [facility];  Service: Gastroenterology;  Laterality: N/A;   ESOPHAGOGASTRODUODENOSCOPY (EGD) WITH PROPOFOL N/A 01/03/2019   Procedure: ESOPHAGOGASTRODUODENOSCOPY (EGD) WITH PROPOFOL;  Surgeon: Lucilla Lame, MD;  Location: [facility];  Service: Endoscopy;  Laterality: N/A;   GIVENS CAPSULE STUDY  04/16/2019   Procedure: GIVENS CAPSULE STUDY;  Surgeon: Virgel Manifold, MD;  Location: [facility];  Service: Endoscopy;;   GIVENS CAPSULE STUDY N/A 06/25/2019   Procedure: GIVENS CAPSULE STUDY;  Surgeon: Jonathon Bellows, MD;  Location: [facility];  Service: Gastroenterology;  Laterality: N/A;   LOWER EXTREMITY ANGIOGRAPHY Right 08/21/2018   Procedure: LOWER EXTREMITY ANGIOGRAPHY;  Surgeon: Algernon Huxley, MD;  Location: [place] [facility];  Service: Cardiovascular;  Laterality: Right;   LOWER EXTREMITY ANGIOGRAPHY Left 08/28/2018   Procedure: LOWER EXTREMITY ANGIOGRAPHY;  Surgeon: Algernon Huxley, MD;  Location: [place] [facility];  Service: Cardiovascular;  Laterality: Left;   LOWER EXTREMITY ANGIOGRAPHY Right 08/28/2018   Procedure: Lower Extremity Angiography;  Surgeon: Algernon Huxley, MD;  Location: [place] [facility];  Service: Cardiovascular;  Laterality: Right;   LOWER EXTREMITY ANGIOGRAPHY Right 12/18/2018   Procedure: Lower Extremity Angiography;  Surgeon: Algernon Huxley, MD;  Location: [place] [facility];  Service: Cardiovascular;  Laterality: Right;   LOWER EXTREMITY ANGIOGRAPHY Right 12/18/2018   Procedure: Lower Extremity Angiography;  Surgeon: Algernon Huxley, MD;  Location:  [place] [facility];  Service: Cardiovascular;  Laterality: Right;   LOWER EXTREMITY ANGIOGRAPHY Left 12/21/2018   Procedure: Lower Extremity Angiography;  Surgeon: Algernon Huxley, MD;  Location: [place] [facility];  Service: Cardiovascular;  Laterality: Left;   LOWER EXTREMITY ANGIOGRAPHY Right 12/21/2018   Procedure: Lower Extremity Angiography;  Surgeon: Algernon Huxley, MD;  Location: [place] [facility];  Service: Cardiovascular;  Laterality: Right;   LOWER EXTREMITY ANGIOGRAPHY Left 12/25/2020   Procedure: LOWER EXTREMITY ANGIOGRAPHY;  Surgeon: Algernon Huxley, MD;  Location: [place] [facility];  Service: Cardiovascular;  Laterality: Left;   LOWER EXTREMITY INTERVENTION N/A 12/22/2018   Procedure: LOWER EXTREMITY INTERVENTION;  Surgeon: Algernon Huxley, MD;  Location: [place] [facility];  Service: Cardiovascular;  Laterality: N/A;   LOWER EXTREMITY INTERVENTION Left 12/26/2020  Procedure: LOWER EXTREMITY INTERVENTION;  Surgeon: Algernon Huxley, MD;  Location: [place] [facility];  Service: Cardiovascular;  Laterality: Left;    Family History  Problem Relation Age of Onset   Breast cancer Mother 43    Allergies  Allergen Reactions   Metformin And Related Diarrhea   Penicillins Hives    Has patient had a PCN reaction causing immediate rash, facial/tongue/throat swelling, SOB or lightheadedness with hypotension: Yes Has patient had a PCN reaction causing severe rash involving mucus membranes or skin necrosis: No Has patient had a PCN reaction that required hospitalization: No Has patient had a PCN reaction occurring within the last 10 years: No If all of the above answers are "NO", then may proceed with Cephalosporin use.   Tramadol Itching    CBC Latest Ref Rng & Units 12/25/2020 11/26/2020 05/28/2020  WBC 4.0 - 10.5 K/uL 20.1(H) 8.6 8.8  Hemoglobin 12.0 - 15.0 g/dL 11.4(L) 11.1(L) 11.1(L)  Hematocrit 36.0 - 46.0 % 31.6(L) 32.0(L) 31.1(L)  Platelets 150 - 400 K/uL 225 332 340      CMP      Component Value Date/Time   NA 139 12/25/2020 1648   K 3.8 12/25/2020 1648   CL 107 12/25/2020 1648   CO2 23 12/25/2020 1648   GLUCOSE 232 (H) 12/25/2020 1648   BUN 15 12/25/2020 1648   CREATININE 0.93 12/25/2020 1648   CALCIUM 9.2 12/25/2020 1648   PROT 7.4 12/25/2020 1648   ALBUMIN 3.9 12/25/2020 1648   AST 21 12/25/2020 1648   ALT 14 12/25/2020 1648   ALKPHOS 92 12/25/2020 1648   BILITOT 0.8 12/25/2020 1648   GFRNONAA >60 12/25/2020 1648   GFRAA >60 11/23/2019 1505     VAS [place] ABI WITH/WO TBI  Result Date: 01/30/2021  LOWER EXTREMITY DOPPLER STUDY Patient Name:  SHWETHA MARUCCI  Date of Exam:   01/20/2021 Medical Rec #: [id number]           Accession #:    [id number] Date of Birth: [birth year]            Patient Gender: F Patient Age:   81 years Exam Location:  [facility] Procedure:      VAS [place] ABI WITH/WO TBI Referring Phys: Corene Cornea DEW --------------------------------------------------------------------------------  Indications: Peripheral artery disease, and right BKA.  Vascular Interventions: 08/21/18: Right SFA/popliteal artery PTA/stent;                         08/28/18: Left EIA & SFA/popliteal artery stents;                         08/28/18: Right popliteal, TP trunk & peroneal artery                         thrombectomies/PTAs;                         12/27/18: Right BKA;                         12/30/2020 left thrombectomy to occluded SFA. Comparison Study: 12/10/2020 Performing Technologist: Concha Norway RVT  Examination Guidelines: A complete evaluation includes at minimum, Doppler waveform signals and systolic blood pressure reading at the level of bilateral brachial, anterior tibial, and posterior tibial arteries, when vessel segments are accessible. Bilateral testing is considered an integral part of  a complete examination. Photoelectric Plethysmograph (PPG) waveforms and toe systolic pressure readings are included as required and additional duplex testing as needed.  Limited examinations for reoccurring indications may be performed as noted.  ABI Findings: +--------+------------------+-----+--------+--------+ Right   Rt Pressure (mmHg)IndexWaveformComment  +--------+------------------+-----+--------+--------+ [id number]                                     +--------+------------------+-----+--------+--------+ +---------+------------------+-----+--------+-------+ Left     Lt Pressure (mmHg)IndexWaveformComment +---------+------------------+-----+--------+-------+ ATA      119                    biphasic1.11    +---------+------------------+-----+--------+-------+ PTA      129               1.21 biphasic        +---------+------------------+-----+--------+-------+ Great Toe86                0.80                 +---------+------------------+-----+--------+-------+ +-------+-----------+-----------+------------+------------+ ABI/TBIToday's ABIToday's TBIPrevious ABIPrevious TBI +-------+-----------+-----------+------------+------------+ Right  BKA                                            +-------+-----------+-----------+------------+------------+ Left   1.21       .80        .96         .65          +-------+-----------+-----------+------------+------------+  Left ABIs and TBIs appear increased compared to prior study on 12/10/2020.  Summary: Left: Resting left ankle-brachial index is within normal range. No evidence of significant left lower extremity arterial disease. The left toe-brachial index is normal.  *See table(s) above for measurements and observations.  Electronically signed by [name] MD on 01/30/2021 at 8:54:58 AM.    Final        Assessment & Plan:   1. Peripheral arterial disease with history of revascularization [facility]) The patient has continued numbness of her left lower extremity following revascularization.  Dopplers today do reveal perfusion down to her foot.  This is likely due to the ischemia that  had been prior to revascularization.  I explained to the patient that it is possible that some of her feeling may return but it may take months for it to do this.  She also notes that her gabapentin is less useful as it has been.  Therefore we will increase the dosage.  We will plan to have the patient return in 2 months for noninvasive studies.  2. Essential hypertension Continue antihypertensive medications as already ordered, these medications have been reviewed and there are no changes at this time.   3. Leg swelling The patient still has some continued leg swelling around her ankle area.  This also may be due in part to her rheumatoid arthritis.  Otherwise the leg swelling is resolved.   Current Outpatient Medications on File Prior to Visit  Medication Sig Dispense Refill   ferrous sulfate 325 (65 FE) MG tablet Take 325 mg by mouth daily.      folic acid (FOLVITE) 1 MG tablet Take 1 mg by mouth daily.     HYDROcodone-acetaminophen (NORCO/VICODIN) 5-325 MG tablet Take 1 tablet by mouth every 4 (four) hours as needed for moderate pain. 20 tablet 0  hydroxychloroquine (PLAQUENIL) 200 MG tablet Take 200 mg by mouth daily.     JARDIANCE 10 MG TABS tablet Take 10 mg by mouth daily.     lisinopril (ZESTRIL) 10 MG tablet Take 1 tablet (10 mg total) by mouth daily. 30 tablet 1   Melatonin 1 MG TABS Take 1 tablet by mouth at bedtime.     methotrexate (RHEUMATREX) 2.5 MG tablet Take 15 mg by mouth once a week.     Multiple Vitamins-Minerals (WOMENS MULTIVITAMIN PO) Take 1 tablet by mouth daily.     rosuvastatin (CRESTOR) 5 MG tablet Take 1 tablet (5 mg total) by mouth daily at 6 PM. 30 tablet 1   sitaGLIPtin (JANUVIA) 100 MG tablet Take 100 mg by mouth daily.     vitamin B-12 (CYANOCOBALAMIN) 500 MCG tablet Take 1 tablet (500 mcg total) by mouth daily. 90 tablet 1   acetaminophen (TYLENOL) 325 MG tablet Take 650 mg by mouth every 6 (six) hours as needed. (Patient not taking: No sig reported)      albuterol (PROVENTIL HFA;VENTOLIN HFA) 108 (90 Base) MCG/ACT inhaler Inhale 2 puffs into the lungs every 6 (six) hours as needed for wheezing or shortness of breath. (Patient not taking: No sig reported) 1 Inhaler 2   apixaban (ELIQUIS) 2.5 MG TABS tablet Take 1 tablet (2.5 mg total) by mouth 2 (two) times daily. (Patient not taking: Reported on 02/16/2021) 60 tablet 5   cetirizine (ZYRTEC) 10 MG tablet Take 10 mg by mouth daily. (Patient not taking: No sig reported)     clopidogrel (PLAVIX) 75 MG tablet Take 1 tablet (75 mg total) by mouth daily. (Patient not taking: Reported on 02/16/2021) 90 tablet 3   docusate sodium (COLACE) 100 MG capsule Take 100 mg by mouth 2 (two) times daily. (Patient not taking: Reported on 02/16/2021)     DULoxetine (CYMBALTA) 30 MG capsule Take 30 mg by mouth daily. (Patient not taking: No sig reported)     famotidine (PEPCID) 20 MG tablet Take 1 tablet (20 mg total) by mouth 2 (two) times daily. 60 tablet 1   ketorolac (ACULAR) 0.4 % SOLN INSTILL 1 DROP INTO AFFECTED EYE 4 TIMES DAILY. BEGINNING AFTER SURGERY. (Patient not taking: Reported on 12/25/2020)     latanoprost (XALATAN) 0.005 % ophthalmic solution 1 drop at bedtime. (Patient not taking: No sig reported)     moxifloxacin (VIGAMOX) 0.5 % ophthalmic solution 1 drop 3 (three) times daily. (Patient not taking: No sig reported)     Na Sulfate-K Sulfate-Mg Sulf 17.5-3.13-1.6 GM/177ML SOLN At 5 PM the day before procedure take 1 bottle and 5 hours before procedure take 1 bottle. (Patient not taking: Reported on 02/16/2021) 708 mL 0   prednisoLONE acetate (PRED FORTE) 1 % ophthalmic suspension INSTILL 1 DROP 4 TIMES DAILY AS DIRECTED. BEGINNING AFTER SURGERY (Patient not taking: No sig reported)     No current facility-administered medications on file prior to visit.    There are no Patient Instructions on file for this visit. Return in about 2 months (around 04/18/2021) for PAD; ABI JD/FB.   Kris Hartmann,  NP

## 2021-03-26 ENCOUNTER — Other Ambulatory Visit (INDEPENDENT_AMBULATORY_CARE_PROVIDER_SITE_OTHER): Payer: Self-pay | Admitting: Vascular Surgery

## 2021-03-26 DIAGNOSIS — I739 Peripheral vascular disease, unspecified: Secondary | ICD-10-CM

## 2021-03-27 ENCOUNTER — Telehealth (INDEPENDENT_AMBULATORY_CARE_PROVIDER_SITE_OTHER): Payer: Self-pay

## 2021-03-27 NOTE — Telephone Encounter (Signed)
Pt called and left a VM for [employer] saying she needs an order for a socket for her prothesis  that hers is now to [facility].

## 2021-04-07 NOTE — Telephone Encounter (Signed)
Please advise for the message below.

## 2021-04-17 ENCOUNTER — Other Ambulatory Visit (INDEPENDENT_AMBULATORY_CARE_PROVIDER_SITE_OTHER): Payer: Self-pay | Admitting: Vascular Surgery

## 2021-04-17 DIAGNOSIS — Z9889 Other specified postprocedural states: Secondary | ICD-10-CM

## 2021-04-17 NOTE — Telephone Encounter (Signed)
Pt called and left a VM for [employer] saying she needs an order for a socket for her prothesis  that hers is now to [facility]. 

## 2021-04-19 ENCOUNTER — Encounter: Payer: Self-pay | Admitting: Oncology

## 2021-04-19 ENCOUNTER — Other Ambulatory Visit (INDEPENDENT_AMBULATORY_CARE_PROVIDER_SITE_OTHER): Payer: Self-pay | Admitting: Nurse Practitioner

## 2021-04-21 ENCOUNTER — Ambulatory Visit (INDEPENDENT_AMBULATORY_CARE_PROVIDER_SITE_OTHER): Payer: Medicare Other

## 2021-04-21 ENCOUNTER — Ambulatory Visit (INDEPENDENT_AMBULATORY_CARE_PROVIDER_SITE_OTHER): Payer: Commercial Managed Care - HMO | Admitting: Vascular Surgery

## 2021-04-21 ENCOUNTER — Encounter (INDEPENDENT_AMBULATORY_CARE_PROVIDER_SITE_OTHER): Payer: Self-pay | Admitting: Vascular Surgery

## 2021-04-21 VITALS — BP 116/68 | HR 80 | Resp 16 | Wt 137.0 lb

## 2021-04-21 DIAGNOSIS — Z9889 Other specified postprocedural states: Secondary | ICD-10-CM

## 2021-04-21 DIAGNOSIS — I10 Essential (primary) hypertension: Secondary | ICD-10-CM | POA: Diagnosis not present

## 2021-04-21 DIAGNOSIS — I739 Peripheral vascular disease, unspecified: Secondary | ICD-10-CM

## 2021-04-21 DIAGNOSIS — E785 Hyperlipidemia, unspecified: Secondary | ICD-10-CM | POA: Diagnosis not present

## 2021-04-21 DIAGNOSIS — E119 Type 2 diabetes mellitus without complications: Secondary | ICD-10-CM | POA: Diagnosis not present

## 2021-04-21 DIAGNOSIS — Z89511 Acquired absence of right leg below knee: Secondary | ICD-10-CM

## 2021-04-21 DIAGNOSIS — I70229 Atherosclerosis of native arteries of extremities with rest pain, unspecified extremity: Secondary | ICD-10-CM

## 2021-04-21 NOTE — Assessment & Plan Note (Signed)
Left ABI today was 1.2 with fairly strong monophasic waveforms and a digit pressure of 64.  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  Not much further to do at this point to improve her perfusion, but I would monitor her closely with a mild to moderate stenosis seen per tickly in the popliteal artery on the left.  We will keep her on a 51-month follow-up interval and continue current medical regimen.

## 2021-04-21 NOTE — Progress Notes (Signed)
MRN : [id number]  Sylvia [name] is a 66 y.o. (12-30-1955) female who presents with chief complaint of  Chief Complaint  Patient presents with   Follow-up    Ultrasound follow up  .  History of Present Illness: Patient returns today in follow up of her peripheral arterial disease.  She is doing well.  She underwent extensive revascularization about 4 months ago of the left lower extremity.  Her left leg is doing well but she does still have numbness in the left toes.  She has gotten her new prosthesis for her right leg and it is working well.  Her previous prosthesis had become poorly fitting with weight loss and this was fitting well and seems to suit her fine. Left ABI today was 1.2 with fairly strong monophasic waveforms and a digit pressure of 64.  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  Current Outpatient Medications  Medication Sig Dispense Refill   ELIQUIS 2.5 MG TABS tablet TAKE 1 TABLET BY MOUTH  TWICE DAILY 120 tablet 5   ferrous sulfate 325 (65 FE) MG tablet Take 325 mg by mouth daily.      folic acid (FOLVITE) 1 MG tablet Take 1 mg by mouth daily.     gabapentin (NEURONTIN) 300 MG capsule TAKE 2 CAPSULES BY MOUTH IN THE  MORNING AND 3 CAPSULES BY MOUTH  IN THE EVENING 450 capsule 3   HYDROcodone-acetaminophen (NORCO/VICODIN) 5-325 MG tablet Take 1 tablet by mouth every 4 (four) hours as needed for moderate pain. 20 tablet 0   hydroxychloroquine (PLAQUENIL) 200 MG tablet Take 200 mg by mouth daily.     JARDIANCE 10 MG TABS tablet Take 10 mg by mouth daily.     lisinopril (ZESTRIL) 10 MG tablet Take 1 tablet (10 mg total) by mouth daily. 30 tablet 1   Melatonin 1 MG TABS Take 1 tablet by mouth at bedtime.     methotrexate (RHEUMATREX) 2.5 MG tablet Take 15 mg by mouth once a week.     Multiple Vitamins-Minerals (WOMENS MULTIVITAMIN PO) Take 1 tablet by mouth  daily.     rosuvastatin (CRESTOR) 5 MG tablet Take 1 tablet (5 mg total) by mouth daily at 6 PM. 30 tablet 1   sitaGLIPtin (JANUVIA) 100 MG tablet Take 100 mg by mouth daily.     vitamin B-12 (CYANOCOBALAMIN) 500 MCG tablet Take 1 tablet (500 mcg total) by mouth daily. 90 tablet 1   acetaminophen (TYLENOL) 325 MG tablet Take 650 mg by mouth every 6 (six) hours as needed. (Patient not taking: Reported on 12/25/2020)     albuterol (PROVENTIL HFA;VENTOLIN HFA) 108 (90 Base) MCG/ACT inhaler Inhale 2 puffs into the lungs every 6 (six) hours as needed for wheezing or shortness of breath. (Patient not taking: Reported on 12/25/2020) 1 Inhaler 2   cetirizine (ZYRTEC) 10 MG tablet Take 10 mg by mouth daily. (Patient not taking: Reported on 12/25/2020)     clopidogrel (PLAVIX) 75 MG tablet Take 1 tablet (75 mg total) by mouth daily. (Patient not taking: Reported on 02/16/2021) 90 tablet 3   docusate sodium (COLACE) 100 MG capsule Take 100 mg by mouth 2 (two) times daily. (Patient not taking: Reported on 02/16/2021)     DULoxetine (CYMBALTA) 30 MG capsule Take 30 mg by mouth daily. (Patient not taking: Reported on 12/25/2020)     famotidine (  PEPCID) 20 MG tablet Take 1 tablet (20 mg total) by mouth 2 (two) times daily. 60 tablet 1   ketorolac (ACULAR) 0.4 % SOLN INSTILL 1 DROP INTO AFFECTED EYE 4 TIMES DAILY. BEGINNING AFTER SURGERY. (Patient not taking: Reported on 12/25/2020)     latanoprost (XALATAN) 0.005 % ophthalmic solution 1 drop at bedtime. (Patient not taking: No sig reported)     moxifloxacin (VIGAMOX) 0.5 % ophthalmic solution 1 drop 3 (three) times daily. (Patient not taking: Reported on 12/25/2020)     Na Sulfate-K Sulfate-Mg Sulf 17.5-3.13-1.6 GM/177ML SOLN At 5 PM the day before procedure take 1 bottle and 5 hours before procedure take 1 bottle. (Patient not taking: Reported on 02/16/2021) 708 mL 0   prednisoLONE acetate (PRED FORTE) 1 % ophthalmic suspension INSTILL 1 DROP 4 TIMES DAILY AS DIRECTED.  BEGINNING AFTER SURGERY (Patient not taking: Reported on 12/25/2020)     No current facility-administered medications for this visit.    Past Medical History:  Diagnosis Date   Anemia of chronic disease 05/16/2019   Anxiety    h/o   COPD (chronic obstructive pulmonary disease) (HCC)    Diabetes mellitus without complication (HCC)    GERD (gastroesophageal reflux disease)    Hypertension    bp under control-off meds since 2019    Past Surgical History:  Procedure Laterality Date   ABDOMINAL HYSTERECTOMY     AMPUTATION Right 12/27/2018   Procedure: AMPUTATION BELOW KNEE;  Surgeon: Algernon Huxley, MD;  Location: [facility];  Service: General;  Laterality: Right;   CATARACT EXTRACTION W/PHACO Right 03/27/2020   Procedure: CATARACT EXTRACTION PHACO AND INTRAOCULAR LENS PLACEMENT ([place]) RIGHT DIABETIC 7.54 00:52.5;  Surgeon: Birder Robson, MD;  Location: [place];  Service: Ophthalmology;  Laterality: Right;   CATARACT EXTRACTION W/PHACO Left 05/20/2020   Procedure: CATARACT EXTRACTION PHACO AND INTRAOCULAR LENS PLACEMENT ([facility]) LEFT DIABETIC 4.95 00:37.6;  Surgeon: Birder Robson, MD;  Location: [place];  Service: Ophthalmology;  Laterality: Left;  Diabetic - oral meds COVID + 04-24-20   CHOLECYSTECTOMY     COLONOSCOPY WITH PROPOFOL N/A 04/16/2019   Procedure: COLONOSCOPY WITH PROPOFOL;  Surgeon: Virgel Manifold, MD;  Location: [facility];  Service: Endoscopy;  Laterality: N/A;   COLONOSCOPY WITH PROPOFOL N/A 01/16/2020   Procedure: COLONOSCOPY WITH PROPOFOL;  Surgeon: Virgel Manifold, MD;  Location: [facility];  Service: Endoscopy;  Laterality: N/A;   ESOPHAGOGASTRODUODENOSCOPY (EGD) WITH PROPOFOL N/A 06/15/2018   Procedure: ESOPHAGOGASTRODUODENOSCOPY (EGD) WITH PROPOFOL;  Surgeon: Tol[place], Benay Pike, MD;  Location: [facility];  Service: Gastroenterology;  Laterality: N/A;   ESOPHAGOGASTRODUODENOSCOPY (EGD) WITH PROPOFOL N/A 01/03/2019   Procedure:  ESOPHAGOGASTRODUODENOSCOPY (EGD) WITH PROPOFOL;  Surgeon: Lucilla Lame, MD;  Location: [facility];  Service: Endoscopy;  Laterality: N/A;   GIVENS CAPSULE STUDY  04/16/2019   Procedure: GIVENS CAPSULE STUDY;  Surgeon: Virgel Manifold, MD;  Location: [facility];  Service: Endoscopy;;   GIVENS CAPSULE STUDY N/A 06/25/2019   Procedure: GIVENS CAPSULE STUDY;  Surgeon: Jonathon Bellows, MD;  Location: [facility];  Service: Gastroenterology;  Laterality: N/A;   LOWER EXTREMITY ANGIOGRAPHY Right 08/21/2018   Procedure: LOWER EXTREMITY ANGIOGRAPHY;  Surgeon: Algernon Huxley, MD;  Location: [place] [facility];  Service: Cardiovascular;  Laterality: Right;   LOWER EXTREMITY ANGIOGRAPHY Left 08/28/2018   Procedure: LOWER EXTREMITY ANGIOGRAPHY;  Surgeon: Algernon Huxley, MD;  Location: [place] [facility];  Service: Cardiovascular;  Laterality: Left;   LOWER EXTREMITY ANGIOGRAPHY Right 08/28/2018   Procedure:  Lower Extremity Angiography;  Surgeon: Algernon Huxley, MD;  Location: [place] [facility];  Service: Cardiovascular;  Laterality: Right;   LOWER EXTREMITY ANGIOGRAPHY Right 12/18/2018   Procedure: Lower Extremity Angiography;  Surgeon: Algernon Huxley, MD;  Location: [place] [facility];  Service: Cardiovascular;  Laterality: Right;   LOWER EXTREMITY ANGIOGRAPHY Right 12/18/2018   Procedure: Lower Extremity Angiography;  Surgeon: Algernon Huxley, MD;  Location: [place] [facility];  Service: Cardiovascular;  Laterality: Right;   LOWER EXTREMITY ANGIOGRAPHY Left 12/21/2018   Procedure: Lower Extremity Angiography;  Surgeon: Algernon Huxley, MD;  Location: [place] [facility];  Service: Cardiovascular;  Laterality: Left;   LOWER EXTREMITY ANGIOGRAPHY Right 12/21/2018   Procedure: Lower Extremity Angiography;  Surgeon: Algernon Huxley, MD;  Location: [place] [facility];  Service: Cardiovascular;  Laterality: Right;   LOWER EXTREMITY ANGIOGRAPHY Left 12/25/2020   Procedure: LOWER EXTREMITY ANGIOGRAPHY;  Surgeon: Algernon Huxley, MD;  Location: [place] [facility];  Service: Cardiovascular;  Laterality: Left;   LOWER EXTREMITY INTERVENTION N/A 12/22/2018   Procedure: LOWER EXTREMITY INTERVENTION;  Surgeon: Algernon Huxley, MD;  Location: [place] [facility];  Service: Cardiovascular;  Laterality: N/A;   LOWER EXTREMITY INTERVENTION Left 12/26/2020   Procedure: LOWER EXTREMITY INTERVENTION;  Surgeon: Algernon Huxley, MD;  Location: [place] [facility];  Service: Cardiovascular;  Laterality: Left;     Social History   Tobacco Use   Smoking status: Some Days    Packs/day: 0.25    Years: 45.00    Pack years: 11.25    Types: Cigarettes   Smokeless tobacco: Never   Tobacco comments:    quit  Vaping Use   Vaping Use: Never used  Substance Use Topics   Alcohol use: No   Drug use: Not Currently    Comment: last used in April 2021 per patient      Family History  Problem Relation Age of Onset   Breast cancer Mother 22     Allergies  Allergen Reactions   Metformin And Related Diarrhea   Penicillins Hives    Has patient had a PCN reaction causing immediate rash, facial/tongue/throat swelling, SOB or lightheadedness with hypotension: Yes Has patient had a PCN reaction causing severe rash involving mucus membranes or skin necrosis: No Has patient had a PCN reaction that required hospitalization: No Has patient had a PCN reaction occurring within the last 10 years: No If all of the above answers are "NO", then may proceed with Cephalosporin use.   Tramadol Itching    REVIEW OF SYSTEMS (Negative unless checked)   Constitutional: [] Weight loss  [] Fever  [] Chills Cardiac: [] Chest pain   [] Chest pressure   [] Palpitations   [] Shortness of breath when laying flat   [] Shortness of breath at rest   [] Shortness of breath with exertion. Vascular:  [] Pain in legs with walking   [] Pain in legs at rest   [] Pain in legs when laying flat   [] Claudication   [] Pain in feet when walking  [] Pain in feet at rest  [] Pain in  feet when laying flat   [] History of DVT   [] Phlebitis   [] Swelling in legs   [] Varicose veins   [] Non-healing ulcers Pulmonary:   [] Uses home oxygen   [] Productive cough   [] Hemoptysis   [] Wheeze  [] COPD   [] Asthma Neurologic:  [] Dizziness  [] Blackouts   [] Seizures   [] History of stroke   [] History of TIA  [] Aphasia   [] Temporary blindness   []   Dysphagia   [] Weakness or numbness in arms   [] Weakness or numbness in legs Musculoskeletal:  [] Arthritis   [] Joint swelling   [x] Joint pain   [] Low back pain Hematologic:  [] Easy bruising  [] Easy bleeding   [] Hypercoagulable state   [] Anemic   Gastrointestinal:  [] Blood in stool   [] Vomiting blood  [x] Gastroesophageal reflux/heartburn   [] Abdominal pain Genitourinary:  [x] Chronic kidney disease   [] Difficult urination  [] Frequent urination  [] Burning with urination   [] Hematuria Skin:  [] Rashes   [] Ulcers   [] Wounds Psychological:  [] History of anxiety   []  History of major depression.  Physical Examination  BP 116/68 (BP Location: Right Arm)    Pulse 80    Resp 16    Wt 137 lb (62.1 kg)    BMI 20.23 kg/m  Gen:  WD/WN, NAD Head: [place]/AT, No temporalis wasting. Ear/Nose/Throat: Hearing grossly intact, nares w/o erythema or drainage Eyes: Conjunctiva clear. Sclera non-icteric Neck: Supple.  Trachea midline Pulmonary:  Good air movement, no use of accessory muscles.  Cardiac: RRR, no JVD Vascular:  Vessel Right Left  Radial Palpable Palpable                          PT Not Palpable 1+ Palpable  DP Not Palpable 1+ Palpable   Gastrointestinal: soft, non-tender/non-distended. No guarding/reflex.  Musculoskeletal: M/S 5/5 throughout. Right BKA with prosthesis in place. Trace LLE edema. Neurologic: Sensation grossly intact in extremities.  Symmetrical.  Speech is fluent.  Psychiatric: Judgment intact, Mood & affect appropriate for pt's clinical situation. Dermatologic: No rashes or ulcers noted.  No cellulitis or open wounds.      Labs No  results found for this or any previous visit (from the past 2160 hour(s)).  Radiology No results found.  Assessment/Plan HTN (hypertension) blood pressure control important in reducing the progression of atherosclerotic disease. On appropriate oral medications.     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   HLD (hyperlipidemia) lipid control important in reducing the progression of atherosclerotic disease. Continue statin therapy     Non-insulin dependent type 2 diabetes mellitus (HCC) blood glucose control important in reducing the progression of atherosclerotic disease. Also, involved in wound healing. On appropriate medications.  Atherosclerosis of artery of extremity with rest pain (HCC) Left ABI today was 1.2 with fairly strong monophasic waveforms and a digit pressure of 64.  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  Not much further to do at this point to improve her perfusion, but I would monitor her closely with a mild to moderate stenosis seen per tickly in the popliteal artery on the left.  We will keep her on a 63-month follow-up interval and continue current medical regimen.    Leotis Pain, MD  04/21/2021 11:57 AM    This note was created with Dragon medical transcription system.  Any errors from dictation are purely unintentional

## 2021-04-27 ENCOUNTER — Other Ambulatory Visit (INDEPENDENT_AMBULATORY_CARE_PROVIDER_SITE_OTHER): Payer: Self-pay | Admitting: Nurse Practitioner

## 2021-04-27 ENCOUNTER — Telehealth (INDEPENDENT_AMBULATORY_CARE_PROVIDER_SITE_OTHER): Payer: Self-pay

## 2021-04-27 MED ORDER — HYDROCODONE-ACETAMINOPHEN 5-325 MG PO TABS: 1.0000 | ORAL_TABLET | Freq: Four times a day (QID) | ORAL | 0 refills | Status: DC | PRN

## 2021-04-27 NOTE — Telephone Encounter (Signed)
Patient was made aware with medical advice and informed that she is contacting [facility] about prothesis socket.

## 2021-04-27 NOTE — Telephone Encounter (Signed)
We can give her a one time pain Rx.  We can't provide chronic pain medication, if she is continuing to have pain, she should discuss with her prosthetic company

## 2021-05-04 NOTE — Telephone Encounter (Signed)
Please advise 

## 2021-06-09 ENCOUNTER — Encounter (INDEPENDENT_AMBULATORY_CARE_PROVIDER_SITE_OTHER): Payer: Self-pay

## 2021-06-09 ENCOUNTER — Ambulatory Visit (INDEPENDENT_AMBULATORY_CARE_PROVIDER_SITE_OTHER): Payer: Medicare Other | Admitting: Vascular Surgery

## 2021-06-09 ENCOUNTER — Encounter (INDEPENDENT_AMBULATORY_CARE_PROVIDER_SITE_OTHER): Payer: Medicare Other

## 2021-06-15 ENCOUNTER — Encounter: Payer: Self-pay | Admitting: Oncology

## 2021-06-30 ENCOUNTER — Other Ambulatory Visit (HOSPITAL_COMMUNITY): Payer: Self-pay | Admitting: Rheumatology

## 2021-06-30 DIAGNOSIS — M05711 Rheumatoid arthritis with rheumatoid factor of right shoulder without organ or systems involvement: Secondary | ICD-10-CM

## 2021-07-21 ENCOUNTER — Encounter (INDEPENDENT_AMBULATORY_CARE_PROVIDER_SITE_OTHER): Payer: Self-pay | Admitting: Nurse Practitioner

## 2021-07-21 ENCOUNTER — Ambulatory Visit (INDEPENDENT_AMBULATORY_CARE_PROVIDER_SITE_OTHER): Payer: Medicare Other

## 2021-07-21 ENCOUNTER — Ambulatory Visit (INDEPENDENT_AMBULATORY_CARE_PROVIDER_SITE_OTHER): Payer: Medicare Other | Admitting: Nurse Practitioner

## 2021-07-21 VITALS — BP 132/80 | HR 88 | Resp 16 | Wt 140.2 lb

## 2021-07-21 DIAGNOSIS — I70229 Atherosclerosis of native arteries of extremities with rest pain, unspecified extremity: Secondary | ICD-10-CM

## 2021-07-21 DIAGNOSIS — E785 Hyperlipidemia, unspecified: Secondary | ICD-10-CM

## 2021-07-21 DIAGNOSIS — I10 Essential (primary) hypertension: Secondary | ICD-10-CM

## 2021-07-21 DIAGNOSIS — F17200 Nicotine dependence, unspecified, uncomplicated: Secondary | ICD-10-CM | POA: Diagnosis not present

## 2021-07-21 DIAGNOSIS — I739 Peripheral vascular disease, unspecified: Secondary | ICD-10-CM | POA: Diagnosis not present

## 2021-07-21 DIAGNOSIS — Z9889 Other specified postprocedural states: Secondary | ICD-10-CM

## 2021-08-01 ENCOUNTER — Encounter (INDEPENDENT_AMBULATORY_CARE_PROVIDER_SITE_OTHER): Payer: Self-pay | Admitting: Nurse Practitioner

## 2021-08-01 NOTE — Progress Notes (Signed)
Subjective:    Patient ID: Sylvia [name], female    DOB: 09/29/1955, 66 y.o.   MRN: [id number] Chief Complaint  Patient presents with   Follow-up    Ultrasound follow up    The patient returns to the office for followup and review of the noninvasive studies.   There have been no interval changes in lower extremity symptoms. No interval shortening of the patient's claudication distance or development of rest pain symptoms. No new ulcers or wounds have occurred since the last visit.  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  The patient denies amaurosis fugax or recent TIA symptoms. There are no documented recent neurological changes noted. There is no history of DVT, PE or superficial thrombophlebitis. The patient denies recent episodes of angina or shortness of breath.   ABI Rt=bka and Lt=0.93  (previous ABI's Rt=bka and Lt=1.21) Duplex ultrasound of the left lower extremity reveals strong monophasic waveforms throughout with normal toe waveforms.   Review of Systems  Neurological:  Positive for numbness.  All other systems reviewed and are negative.     Objective:   Physical Exam Vitals reviewed.  HENT:     Head: Normocephalic.  Cardiovascular:     Rate and Rhythm: Normal rate.     Pulses:          Dorsalis pedis pulses are detected w/ Doppler on the left side.       Posterior tibial pulses are detected w/ Doppler on the left side.  Pulmonary:     Effort: Pulmonary effort is normal.  Musculoskeletal:     Right Lower Extremity: Right leg is amputated above knee.  Skin:    General: Skin is warm and dry.  Neurological:     Mental Status: She is alert and oriented to person, place, and time.  Psychiatric:        Mood and Affect: Mood  normal.        Behavior: Behavior normal.        Thought Content: Thought content normal.        Judgment: Judgment normal.    BP 132/80 (BP Location: Right Arm)   Pulse 88   Resp 16   Wt 140 lb 3.2 oz (63.6 kg)   BMI 20.70 kg/m   Past Medical History:  Diagnosis Date   Anemia of chronic disease 05/16/2019   Anxiety    h/o   COPD (chronic obstructive pulmonary disease) (HCC)    Diabetes mellitus without complication (HCC)    GERD (gastroesophageal reflux disease)    Hypertension    bp under control-off meds since 2019    Social History   Socioeconomic History   Marital status: Divorced    Spouse name: Not on file   Number of children: 2   Years of education: Not on file   Highest education level: Not on file  Occupational History   Not on file  Tobacco Use   Smoking status: Some Days    Packs/day: 0.25    Years: 45.00    Pack years: 11.25    Types: Cigarettes   Smokeless tobacco: Never   Tobacco comments:    quit  Vaping Use   Vaping Use: Never used  Substance and Sexual Activity   Alcohol use: No  Drug use: Not Currently    Comment: last used in April 2021 per patient   Sexual activity: Not Currently  Other Topics Concern   Not on file  Social History Narrative   5 grandchildren and 5 great grandchildren    2 grandchildren live with Pt. (25 & 73 y.o.)   Social Determinants of Health   Financial Resource Strain: Not on file  Food Insecurity: Not on file  Transportation Needs: Not on file  Physical Activity: Not on file  Stress: Not on file  Social Connections: Not on file  Intimate Partner Violence: Not on file    Past Surgical History:  Procedure Laterality Date   ABDOMINAL HYSTERECTOMY     AMPUTATION Right 12/27/2018   Procedure: AMPUTATION BELOW KNEE;  Surgeon: Annice Needy, MD;  Location: [facility];  Service: General;  Laterality: Right;   CATARACT EXTRACTION W/PHACO Right 03/27/2020   Procedure: CATARACT EXTRACTION PHACO AND INTRAOCULAR LENS  PLACEMENT (IOC) RIGHT DIABETIC 7.54 00:52.5;  Surgeon: Galen Manila, MD;  Location: [facility];  Service: Ophthalmology;  Laterality: Right;   CATARACT EXTRACTION W/PHACO Left 05/20/2020   Procedure: CATARACT EXTRACTION PHACO AND INTRAOCULAR LENS PLACEMENT (IOC) LEFT DIABETIC 4.95 00:37.6;  Surgeon: Galen Manila, MD;  Location: [facility];  Service: Ophthalmology;  Laterality: Left;  Diabetic - oral meds COVID + 04-24-20   CHOLECYSTECTOMY     COLONOSCOPY WITH PROPOFOL N/A 04/16/2019   Procedure: COLONOSCOPY WITH PROPOFOL;  Surgeon: Pasty Spillers, MD;  Location: [facility];  Service: Endoscopy;  Laterality: N/A;   COLONOSCOPY WITH PROPOFOL N/A 01/16/2020   Procedure: COLONOSCOPY WITH PROPOFOL;  Surgeon: Pasty Spillers, MD;  Location: [facility];  Service: Endoscopy;  Laterality: N/A;   ESOPHAGOGASTRODUODENOSCOPY (EGD) WITH PROPOFOL N/A 06/15/2018   Procedure: ESOPHAGOGASTRODUODENOSCOPY (EGD) WITH PROPOFOL;  Surgeon: Toledo, Boykin Nearing, MD;  Location: [facility];  Service: Gastroenterology;  Laterality: N/A;   ESOPHAGOGASTRODUODENOSCOPY (EGD) WITH PROPOFOL N/A 01/03/2019   Procedure: ESOPHAGOGASTRODUODENOSCOPY (EGD) WITH PROPOFOL;  Surgeon: Midge Minium, MD;  Location: [facility];  Service: Endoscopy;  Laterality: N/A;   GIVENS CAPSULE STUDY  04/16/2019   Procedure: GIVENS CAPSULE STUDY;  Surgeon: Pasty Spillers, MD;  Location: [facility];  Service: Endoscopy;;   GIVENS CAPSULE STUDY N/A 06/25/2019   Procedure: GIVENS CAPSULE STUDY;  Surgeon: Wyline Mood, MD;  Location: [facility];  Service: Gastroenterology;  Laterality: N/A;   LOWER EXTREMITY ANGIOGRAPHY Right 08/21/2018   Procedure: LOWER EXTREMITY ANGIOGRAPHY;  Surgeon: Annice Needy, MD;  Location: [facility];  Service: Cardiovascular;  Laterality: Right;   LOWER EXTREMITY ANGIOGRAPHY Left 08/28/2018   Procedure: LOWER EXTREMITY ANGIOGRAPHY;  Surgeon: Annice Needy, MD;   Location: [facility];  Service: Cardiovascular;  Laterality: Left;   LOWER EXTREMITY ANGIOGRAPHY Right 08/28/2018   Procedure: Lower Extremity Angiography;  Surgeon: Annice Needy, MD;  Location: [facility];  Service: Cardiovascular;  Laterality: Right;   LOWER EXTREMITY ANGIOGRAPHY Right 12/18/2018   Procedure: Lower Extremity Angiography;  Surgeon: Annice Needy, MD;  Location: [facility];  Service: Cardiovascular;  Laterality: Right;   LOWER EXTREMITY ANGIOGRAPHY Right 12/18/2018   Procedure: Lower Extremity Angiography;  Surgeon: Annice Needy, MD;  Location: [facility];  Service: Cardiovascular;  Laterality: Right;   LOWER EXTREMITY ANGIOGRAPHY Left 12/21/2018   Procedure: Lower Extremity Angiography;  Surgeon: Annice Needy, MD;  Location: [facility];  Service: Cardiovascular;  Laterality: Left;   LOWER  EXTREMITY ANGIOGRAPHY Right 12/21/2018   Procedure: Lower Extremity Angiography;  Surgeon: Annice Needy, MD;  Location: [facility];  Service: Cardiovascular;  Laterality: Right;   LOWER EXTREMITY ANGIOGRAPHY Left 12/25/2020   Procedure: LOWER EXTREMITY ANGIOGRAPHY;  Surgeon: Annice Needy, MD;  Location: [facility];  Service: Cardiovascular;  Laterality: Left;   LOWER EXTREMITY INTERVENTION N/A 12/22/2018   Procedure: LOWER EXTREMITY INTERVENTION;  Surgeon: Annice Needy, MD;  Location: [facility];  Service: Cardiovascular;  Laterality: N/A;   LOWER EXTREMITY INTERVENTION Left 12/26/2020   Procedure: LOWER EXTREMITY INTERVENTION;  Surgeon: Annice Needy, MD;  Location: [facility];  Service: Cardiovascular;  Laterality: Left;    Family History  Problem Relation Age of Onset   Breast cancer Mother 78    Allergies  Allergen Reactions   Metformin And Related Diarrhea   Penicillins Hives    Has patient had a PCN reaction causing immediate rash, facial/tongue/throat swelling, SOB or lightheadedness with hypotension:  Yes Has patient had a PCN reaction causing severe rash involving mucus membranes or skin necrosis: No Has patient had a PCN reaction that required hospitalization: No Has patient had a PCN reaction occurring within the last 10 years: No If all of the above answers are "NO", then may proceed with Cephalosporin use.   Tramadol Itching       Latest Ref Rng & Units 12/25/2020    4:48 PM 11/26/2020   11:15 AM 05/28/2020   10:57 AM  CBC  WBC 4.0 - 10.5 K/uL 20.1   8.6   8.8    Hemoglobin 12.0 - 15.0 g/dL 40.9   81.1   91.4    Hematocrit 36.0 - 46.0 % 31.6   32.0   31.1    Platelets 150 - 400 K/uL 225   332   340        CMP     Component Value Date/Time   NA 139 12/25/2020 1648   K 3.8 12/25/2020 1648   CL 107 12/25/2020 1648   CO2 23 12/25/2020 1648   GLUCOSE 232 (H) 12/25/2020 1648   BUN 15 12/25/2020 1648   CREATININE 0.93 12/25/2020 1648   CALCIUM 9.2 12/25/2020 1648   PROT 7.4 12/25/2020 1648   ALBUMIN 3.9 12/25/2020 1648   AST 21 12/25/2020 1648   ALT 14 12/25/2020 1648   ALKPHOS 92 12/25/2020 1648   BILITOT 0.8 12/25/2020 1648   GFRNONAA >60 12/25/2020 1648   GFRAA >60 11/23/2019 1505     VAS [place] ABI WITH/WO TBI  Result Date: 07/22/2021  LOWER EXTREMITY DOPPLER STUDY Patient Name:  Florentina Marquart  Date of Exam:   07/21/2021 Medical Rec #: [id number]           Accession #:    [id number] Date of Birth: 02-11-1956            Patient Gender: F Patient Age:   37 years Exam Location:  [facility] Procedure:      VAS [place] ABI WITH/WO TBI Referring Phys: [name] --------------------------------------------------------------------------------  Indications: Peripheral artery disease, and right BKA.  Vascular Interventions: 08/21/18: Right SFA/popliteal artery PTA/stent;                         08/28/18: Left EIA & SFA/popliteal artery stents;  08/28/18: Right popliteal, TP trunk & peroneal artery                         thrombectomies/PTAs;                          12/27/18: Right BKA;                         12/25/20: Left SFA/popliteal thrombectomy/PTA;                         12/26/20: Left proximal SFA thombectomy/stent;. Comparison Study: 04/21/2021 Performing Technologist: Debbe BalesSolomon Mcclary RVS  Examination Guidelines: A complete evaluation includes at minimum, Doppler waveform signals and systolic blood pressure reading at the level of bilateral brachial, anterior tibial, and posterior tibial arteries, when vessel segments are accessible. Bilateral testing is considered an integral part of a complete examination. Photoelectric Plethysmograph (PPG) waveforms and toe systolic pressure readings are included as required and additional duplex testing as needed. Limited examinations for reoccurring indications may be performed as noted.  ABI Findings: +--------+------------------+-----+--------+--------+ Right   Rt Pressure (mmHg)IndexWaveformComment  +--------+------------------+-----+--------+--------+ [id number]                                     +--------+------------------+-----+--------+--------+ +---------+------------------+-----+--------+-------+ Left     Lt Pressure (mmHg)IndexWaveformComment +---------+------------------+-----+--------+-------+ Brachial 138                                    +---------+------------------+-----+--------+-------+ ATA      129               0.93 biphasic        +---------+------------------+-----+--------+-------+ PTA      122               0.88 biphasic        +---------+------------------+-----+--------+-------+ Great Toe133               0.96 Normal          +---------+------------------+-----+--------+-------+ +-------+-----------+-----------+------------+------------+ ABI/TBIToday's ABIToday's TBIPrevious ABIPrevious TBI +-------+-----------+-----------+------------+------------+ Right  BKA                   BKA                       +-------+-----------+-----------+------------+------------+ Left   .93        .96        1.21        .80          +-------+-----------+-----------+------------+------------+  Left ABIs appear decreased compared to prior study on 04/21/2021. Left TBIs appear increased compared to prior study on 04/21/2021.  Summary: Right: Rt BKA. Left: Resting left ankle-brachial index indicates mild left lower extremity arterial disease. The left toe-brachial index is normal.  *See table(s) above for measurements and observations.  Electronically signed by Festus BarrenJason Dew MD on 07/22/2021 at 7:50:39 AM.    Final        Assessment & Plan:   1. Peripheral arterial disease with history of revascularization (HCC)  Recommend:  The patient has evidence of atherosclerosis of the lower extremities with claudication.  The patient does not voice lifestyle limiting changes at this point in time.  Noninvasive studies do  not suggest clinically significant change.  No invasive studies, angiography or surgery at this time The patient should continue walking and begin a more formal exercise program.  The patient should continue antiplatelet therapy and aggressive treatment of the lipid abnormalities  No changes in the patient's medications at this time  Continued surveillance is indicated as atherosclerosis is likely to progress with time.    The patient will continue follow up with noninvasive studies as ordered.    We will also refer the patient to neurology as she continues to have issues with phantom limb pain as well as numbness following revascularization.  2. Primary hypertension Continue antihypertensive medications as already ordered, these medications have been reviewed and there are no changes at this time.   3. Tobacco use disorder Smoking cessation was discussed, 3-10 minutes spent on this topic specifically   4. Hyperlipidemia, unspecified hyperlipidemia type Continue statin as ordered and reviewed, no  changes at this time    Current Outpatient Medications on File Prior to Visit  Medication Sig Dispense Refill   ELIQUIS 2.5 MG TABS tablet TAKE 1 TABLET BY MOUTH  TWICE DAILY 120 tablet 5   ferrous sulfate 325 (65 FE) MG tablet Take 325 mg by mouth daily.      folic acid (FOLVITE) 1 MG tablet Take 1 mg by mouth daily.     gabapentin (NEURONTIN) 300 MG capsule TAKE 2 CAPSULES BY MOUTH IN THE  MORNING AND 3 CAPSULES BY MOUTH  IN THE EVENING 450 capsule 3   HYDROcodone-acetaminophen (NORCO/VICODIN) 5-325 MG tablet Take 1 tablet by mouth every 6 (six) hours as needed for moderate pain. 20 tablet 0   hydroxychloroquine (PLAQUENIL) 200 MG tablet Take 200 mg by mouth daily.     JARDIANCE 10 MG TABS tablet Take 10 mg by mouth daily.     lisinopril (ZESTRIL) 10 MG tablet Take 1 tablet (10 mg total) by mouth daily. 30 tablet 1   Melatonin 1 MG TABS Take 1 tablet by mouth at bedtime.     methotrexate (RHEUMATREX) 2.5 MG tablet Take 15 mg by mouth once a week.     Multiple Vitamins-Minerals (WOMENS MULTIVITAMIN PO) Take 1 tablet by mouth daily.     rosuvastatin (CRESTOR) 5 MG tablet Take 1 tablet (5 mg total) by mouth daily at 6 PM. 30 tablet 1   sitaGLIPtin (JANUVIA) 100 MG tablet Take 100 mg by mouth daily.     vitamin B-12 (CYANOCOBALAMIN) 500 MCG tablet Take 1 tablet (500 mcg total) by mouth daily. 90 tablet 1   acetaminophen (TYLENOL) 325 MG tablet Take 650 mg by mouth every 6 (six) hours as needed. (Patient not taking: Reported on 12/25/2020)     albuterol (PROVENTIL HFA;VENTOLIN HFA) 108 (90 Base) MCG/ACT inhaler Inhale 2 puffs into the lungs every 6 (six) hours as needed for wheezing or shortness of breath. (Patient not taking: Reported on 12/25/2020) 1 Inhaler 2   cetirizine (ZYRTEC) 10 MG tablet Take 10 mg by mouth daily. (Patient not taking: Reported on 12/25/2020)     clopidogrel (PLAVIX) 75 MG tablet Take 1 tablet (75 mg total) by mouth daily. (Patient not taking: Reported on 02/16/2021) 90 tablet 3    docusate sodium (COLACE) 100 MG capsule Take 100 mg by mouth 2 (two) times daily. (Patient not taking: Reported on 02/16/2021)     DULoxetine (CYMBALTA) 30 MG capsule Take 30 mg by mouth daily. (Patient not taking: Reported on 12/25/2020)     famotidine (PEPCID) 20 MG  tablet Take 1 tablet (20 mg total) by mouth 2 (two) times daily. 60 tablet 1   ketorolac (ACULAR) 0.4 % SOLN INSTILL 1 DROP INTO AFFECTED EYE 4 TIMES DAILY. BEGINNING AFTER SURGERY. (Patient not taking: Reported on 12/25/2020)     latanoprost (XALATAN) 0.005 % ophthalmic solution 1 drop at bedtime. (Patient not taking: No sig reported)     moxifloxacin (VIGAMOX) 0.5 % ophthalmic solution 1 drop 3 (three) times daily. (Patient not taking: Reported on 12/25/2020)     Na Sulfate-K Sulfate-Mg Sulf 17.5-3.13-1.6 GM/177ML SOLN At 5 PM the day before procedure take 1 bottle and 5 hours before procedure take 1 bottle. (Patient not taking: Reported on 02/16/2021) 708 mL 0   prednisoLONE acetate (PRED FORTE) 1 % ophthalmic suspension INSTILL 1 DROP 4 TIMES DAILY AS DIRECTED. BEGINNING AFTER SURGERY (Patient not taking: Reported on 12/25/2020)     predniSONE (DELTASONE) 5 MG tablet Take 5 mg by mouth daily.     No current facility-administered medications on file prior to visit.    There are no Patient Instructions on file for this visit. No follow-ups on file.   Georgiana Spinner, NP

## 2021-09-14 ENCOUNTER — Other Ambulatory Visit: Payer: Self-pay

## 2021-09-14 ENCOUNTER — Emergency Department
Admission: EM | Admit: 2021-09-14 | Discharge: 2021-09-14 | Disposition: A | Discharge status: Home / Self Care | Payer: Medicare Other | Attending: Emergency Medicine | Admitting: Emergency Medicine

## 2021-09-14 DIAGNOSIS — G5792 Unspecified mononeuropathy of left lower limb: Secondary | ICD-10-CM | POA: Diagnosis not present

## 2021-09-14 DIAGNOSIS — I739 Peripheral vascular disease, unspecified: Secondary | ICD-10-CM | POA: Diagnosis not present

## 2021-09-14 DIAGNOSIS — M79605 Pain in left leg: Secondary | ICD-10-CM | POA: Diagnosis present

## 2021-09-14 DIAGNOSIS — E1140 Type 2 diabetes mellitus with diabetic neuropathy, unspecified: Secondary | ICD-10-CM | POA: Diagnosis not present

## 2021-09-14 DIAGNOSIS — G63 Polyneuropathy in diseases classified elsewhere: Secondary | ICD-10-CM

## 2021-09-14 MED ORDER — OXYCODONE-ACETAMINOPHEN 5-325 MG PO TABS
1.0000 | ORAL_TABLET | Freq: Once | ORAL | Status: AC
  Administered 2021-09-14: 1 via ORAL
  Filled 2021-09-14: qty 1

## 2021-09-14 MED ORDER — OXYCODONE-ACETAMINOPHEN 5-325 MG PO TABS: 1.0000 | ORAL_TABLET | Freq: Three times a day (TID) | ORAL | 0 refills | Status: AC | PRN

## 2021-09-14 NOTE — Discharge Instructions (Signed)
Take the pain medicine as needed. Take your Cymbalta daily for neuropathic pain. Consider increasing the dose of your Gabapentin, by adding a midday dose, as discussed. Follow-up with your primary provider or Dr. Wyn Quaker as planned. See Neurology as discussed for further treatment.

## 2021-09-14 NOTE — ED Triage Notes (Signed)
Pt via POV from home. Pt c/o L leg pain for the past couple of days. Denies any swelling or injury. Pt does have a stent in the L leg. Pt is A&Ox4 and NAD

## 2021-09-14 NOTE — ED Notes (Addendum)
This RN first encounter with pt prior to discharge. Pt verbalized understanding of discharge instructions, prescriptions, and follow-up care instructions. Pt advised if symptoms worsen to return to ED.  

## 2021-09-15 NOTE — ED Provider Notes (Signed)
[facility] Emergency Department Provider Note     Event Date/Time   First MD Initiated Contact with Patient 09/14/21 1821     (approximate)   History   Leg Pain   HPI  Sylvia [name] is a 66 y.o. 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  Patient reports intermittent episodes of pain to the left leg.  Patient is 1 year status post a stent procedure for arterial insufficiency.  She is closely being followed by her primary provider as well as Dr. Debroah Loop with vein and vascular.  She apparently is waiting a referral to neurology for her neuropathic pain.  She has been inconsistently taking her Cymbalta which she thought was for her anxiety.  She has been on this steady dose of gabapentin as prescribed, but denies any other medication at the time but she denies any recent injury, trauma, fall patient also denies any SkinTemp or color changes to the left lower extremity.  Patient presents at this time with pain for the last few days that is consistent with her chronic baseline pain.   Physical Exam   Triage Vital Signs: ED Triage Vitals  Enc Vitals Group     BP 09/14/21 1808 135/72     Pulse Rate 09/14/21 1808 (!) 101     Resp 09/14/21 1808 16     Temp 09/14/21 1808 98.2 F (36.8 C)     Temp Source 09/14/21 1808 Oral     SpO2 09/14/21 1808 100 %     Weight 09/14/21 1806 147 lb (66.7 kg)     Height 09/14/21 1806 5\' 6"  (1.676 m)     Head Circumference --      Peak Flow --      Pain Score 09/14/21 1806 10     Pain Loc --      Pain Edu? --      Excl. in GC? --     Most recent vital signs: Vitals:   09/14/21 1808  BP: 135/72  Pulse: (!) 101  Resp: 16  Temp: 98.2 F (36.8 C)  SpO2: 100%    General Awake, no distress.  CV:  Good peripheral perfusion. Normal LLE pulses on doppler RESP:  Normal effort. CTA ABD:  No distention.   SKIN:  LLE with warm, dry, well-perfused skin.  No edema, induration, warmth, or or cool, clammy, skin is appreciated. MSK:  Normal active range of motion to the LLE.  Prosthetic limb noted to the right LE.  ED Results / Procedures / Treatments   Labs (all labs ordered are listed, but only abnormal results are displayed) Labs Reviewed - No data to display   EKG    RADIOLOGY   No results found.   PROCEDURES:  Critical Care performed: No  Procedures   MEDICATIONS ORDERED IN ED: Medications  oxyCODONE-acetaminophen (PERCOCET/ROXICET) 5-325 MG per tablet 1 tablet (1 tablet Oral Given 09/14/21 1855)     IMPRESSION / MDM / ASSESSMENT AND PLAN / ED COURSE  I reviewed the triage vital signs and the nursing notes.                              Differential diagnosis includes, but is not limited to, vascular insufficiency, ischemic pain, neuropathic pain, cellulitis  Patient to the ED for evaluation of acute  on chronic LLE pain.  Patient with a history of PVD and diabetic neuropathy, presents with acute pain.  No recent injury, trauma, fall.  Exam is reassuring and consistent with acute neuropathic pain without signs of ischemia or infection.  Patient will be discharged home with prescriptions for oxycodone (#9) as well as instructions to dose her Cymbalta daily for adjunct pain relief.  She will increase her gabapentin by adding a midday dose and ramping up over the next week.. Patient is to follow up with primary provider including vein and vascular neurology as needed or otherwise directed. Patient is given ED precautions to return to the ED for any worsening or new symptoms.   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 The history of diabetic neuropathy, PVD with neuropathy,   Karmen StabsMenshew, Charlesetta IvoryJenise V Bacon, PA-C 09/15/21 0007    Jene EveryKinner, Robert, MD 09/15/21 1257

## 2021-09-16 ENCOUNTER — Telehealth (INDEPENDENT_AMBULATORY_CARE_PROVIDER_SITE_OTHER): Payer: Self-pay

## 2021-09-16 ENCOUNTER — Other Ambulatory Visit (INDEPENDENT_AMBULATORY_CARE_PROVIDER_SITE_OTHER): Payer: Self-pay | Admitting: Nurse Practitioner

## 2021-09-16 DIAGNOSIS — Z9889 Other specified postprocedural states: Secondary | ICD-10-CM

## 2021-09-16 NOTE — Telephone Encounter (Signed)
Pt called in regard to leg pain and wants to be seen ASAP for tests on leg.  Spoke with Eulogio Ditch NP and she said bring her in for [place] tomorrow and see her.

## 2021-09-16 NOTE — Telephone Encounter (Signed)
Pt said 2pm appt will work for her tomorrow

## 2021-09-17 ENCOUNTER — Ambulatory Visit (INDEPENDENT_AMBULATORY_CARE_PROVIDER_SITE_OTHER): Payer: Medicare Other

## 2021-09-17 ENCOUNTER — Encounter (INDEPENDENT_AMBULATORY_CARE_PROVIDER_SITE_OTHER): Payer: Self-pay | Admitting: Nurse Practitioner

## 2021-09-17 ENCOUNTER — Ambulatory Visit (INDEPENDENT_AMBULATORY_CARE_PROVIDER_SITE_OTHER): Payer: Medicare Other | Admitting: Nurse Practitioner

## 2021-09-17 VITALS — BP 154/76 | HR 106 | Resp 17 | Ht 67.0 in | Wt 141.0 lb

## 2021-09-17 DIAGNOSIS — I739 Peripheral vascular disease, unspecified: Secondary | ICD-10-CM | POA: Diagnosis not present

## 2021-09-17 DIAGNOSIS — Z9889 Other specified postprocedural states: Secondary | ICD-10-CM

## 2021-09-17 DIAGNOSIS — I10 Essential (primary) hypertension: Secondary | ICD-10-CM

## 2021-09-17 DIAGNOSIS — F17200 Nicotine dependence, unspecified, uncomplicated: Secondary | ICD-10-CM | POA: Diagnosis not present

## 2021-09-18 ENCOUNTER — Telehealth (INDEPENDENT_AMBULATORY_CARE_PROVIDER_SITE_OTHER): Payer: Self-pay

## 2021-09-18 NOTE — Telephone Encounter (Signed)
Spoke with the patient and she was given the information regarding her LLE angio on 09/21/21 with Dr. Wyn Quaker. Patient will arrive at 11:00 am and pre-procedure instructions were discussed and patient stated she understood.

## 2021-09-21 ENCOUNTER — Inpatient Hospital Stay
Admission: RE | Admit: 2021-09-21 | Discharge: 2021-09-28 | DRG: 271 | Disposition: A | Discharge status: Home Health | Payer: Medicare Other | Attending: Vascular Surgery | Admitting: Vascular Surgery

## 2021-09-21 ENCOUNTER — Encounter (INDEPENDENT_AMBULATORY_CARE_PROVIDER_SITE_OTHER): Payer: Self-pay | Admitting: Nurse Practitioner

## 2021-09-21 ENCOUNTER — Encounter: Payer: Self-pay | Admitting: Vascular Surgery

## 2021-09-21 ENCOUNTER — Encounter
Admission: RE | Disposition: A | Discharge status: Home Health | Payer: Self-pay | Source: Home / Self Care | Attending: Vascular Surgery

## 2021-09-21 DIAGNOSIS — Z7952 Long term (current) use of systemic steroids: Secondary | ICD-10-CM

## 2021-09-21 DIAGNOSIS — I998 Other disorder of circulatory system: Secondary | ICD-10-CM | POA: Diagnosis not present

## 2021-09-21 DIAGNOSIS — T82856A Stenosis of peripheral vascular stent, initial encounter: Principal | ICD-10-CM | POA: Diagnosis present

## 2021-09-21 DIAGNOSIS — Z9049 Acquired absence of other specified parts of digestive tract: Secondary | ICD-10-CM

## 2021-09-21 DIAGNOSIS — G8918 Other acute postprocedural pain: Secondary | ICD-10-CM | POA: Diagnosis not present

## 2021-09-21 DIAGNOSIS — I70229 Atherosclerosis of native arteries of extremities with rest pain, unspecified extremity: Secondary | ICD-10-CM

## 2021-09-21 DIAGNOSIS — Y831 Surgical operation with implant of artificial internal device as the cause of abnormal reaction of the patient, or of later complication, without mention of misadventure at the time of the procedure: Secondary | ICD-10-CM | POA: Diagnosis present

## 2021-09-21 DIAGNOSIS — F419 Anxiety disorder, unspecified: Secondary | ICD-10-CM | POA: Diagnosis present

## 2021-09-21 DIAGNOSIS — Z8616 Personal history of COVID-19: Secondary | ICD-10-CM

## 2021-09-21 DIAGNOSIS — Z79899 Other long term (current) drug therapy: Secondary | ICD-10-CM

## 2021-09-21 DIAGNOSIS — Z880 Allergy status to penicillin: Secondary | ICD-10-CM

## 2021-09-21 DIAGNOSIS — E1165 Type 2 diabetes mellitus with hyperglycemia: Secondary | ICD-10-CM | POA: Diagnosis present

## 2021-09-21 DIAGNOSIS — K2950 Unspecified chronic gastritis without bleeding: Secondary | ICD-10-CM | POA: Diagnosis present

## 2021-09-21 DIAGNOSIS — N179 Acute kidney failure, unspecified: Secondary | ICD-10-CM | POA: Diagnosis not present

## 2021-09-21 DIAGNOSIS — K921 Melena: Secondary | ICD-10-CM | POA: Diagnosis not present

## 2021-09-21 DIAGNOSIS — I743 Embolism and thrombosis of arteries of the lower extremities: Secondary | ICD-10-CM | POA: Diagnosis not present

## 2021-09-21 DIAGNOSIS — F17210 Nicotine dependence, cigarettes, uncomplicated: Secondary | ICD-10-CM | POA: Diagnosis present

## 2021-09-21 DIAGNOSIS — Z7902 Long term (current) use of antithrombotics/antiplatelets: Secondary | ICD-10-CM

## 2021-09-21 DIAGNOSIS — T82868A Thrombosis of vascular prosthetic devices, implants and grafts, initial encounter: Secondary | ICD-10-CM | POA: Diagnosis not present

## 2021-09-21 DIAGNOSIS — K449 Diaphragmatic hernia without obstruction or gangrene: Secondary | ICD-10-CM | POA: Diagnosis present

## 2021-09-21 DIAGNOSIS — Z7984 Long term (current) use of oral hypoglycemic drugs: Secondary | ICD-10-CM

## 2021-09-21 DIAGNOSIS — K219 Gastro-esophageal reflux disease without esophagitis: Secondary | ICD-10-CM | POA: Diagnosis present

## 2021-09-21 DIAGNOSIS — M069 Rheumatoid arthritis, unspecified: Secondary | ICD-10-CM | POA: Diagnosis present

## 2021-09-21 DIAGNOSIS — G8929 Other chronic pain: Secondary | ICD-10-CM | POA: Diagnosis present

## 2021-09-21 DIAGNOSIS — E1151 Type 2 diabetes mellitus with diabetic peripheral angiopathy without gangrene: Secondary | ICD-10-CM | POA: Diagnosis present

## 2021-09-21 DIAGNOSIS — Z7901 Long term (current) use of anticoagulants: Secondary | ICD-10-CM

## 2021-09-21 DIAGNOSIS — Z885 Allergy status to narcotic agent status: Secondary | ICD-10-CM

## 2021-09-21 DIAGNOSIS — I10 Essential (primary) hypertension: Secondary | ICD-10-CM | POA: Diagnosis present

## 2021-09-21 DIAGNOSIS — J449 Chronic obstructive pulmonary disease, unspecified: Secondary | ICD-10-CM | POA: Diagnosis present

## 2021-09-21 DIAGNOSIS — K269 Duodenal ulcer, unspecified as acute or chronic, without hemorrhage or perforation: Secondary | ICD-10-CM

## 2021-09-21 DIAGNOSIS — D638 Anemia in other chronic diseases classified elsewhere: Secondary | ICD-10-CM | POA: Diagnosis present

## 2021-09-21 DIAGNOSIS — Z79631 Long term (current) use of antimetabolite agent: Secondary | ICD-10-CM

## 2021-09-21 DIAGNOSIS — I70222 Atherosclerosis of native arteries of extremities with rest pain, left leg: Secondary | ICD-10-CM | POA: Diagnosis present

## 2021-09-21 DIAGNOSIS — Z90710 Acquired absence of both cervix and uterus: Secondary | ICD-10-CM

## 2021-09-21 DIAGNOSIS — Z89511 Acquired absence of right leg below knee: Secondary | ICD-10-CM

## 2021-09-21 DIAGNOSIS — Z961 Presence of intraocular lens: Secondary | ICD-10-CM | POA: Diagnosis present

## 2021-09-21 DIAGNOSIS — Z7985 Long-term (current) use of injectable non-insulin antidiabetic drugs: Secondary | ICD-10-CM

## 2021-09-21 DIAGNOSIS — I77811 Abdominal aortic ectasia: Secondary | ICD-10-CM | POA: Diagnosis not present

## 2021-09-21 DIAGNOSIS — Z881 Allergy status to other antibiotic agents status: Secondary | ICD-10-CM

## 2021-09-21 DIAGNOSIS — Z888 Allergy status to other drugs, medicaments and biological substances status: Secondary | ICD-10-CM

## 2021-09-21 DIAGNOSIS — D62 Acute posthemorrhagic anemia: Secondary | ICD-10-CM | POA: Diagnosis not present

## 2021-09-21 DIAGNOSIS — T82858A Stenosis of vascular prosthetic devices, implants and grafts, initial encounter: Secondary | ICD-10-CM | POA: Diagnosis not present

## 2021-09-21 HISTORY — PX: LOWER EXTREMITY ANGIOGRAPHY: CATH118251

## 2021-09-21 LAB — PROTIME-INR
INR: 1.3 — ABNORMAL HIGH (ref 0.8–1.2)
Prothrombin Time: 16.3 seconds — ABNORMAL HIGH (ref 11.4–15.2)

## 2021-09-21 LAB — GLUCOSE, CAPILLARY
Glucose-Capillary: 224 mg/dL — ABNORMAL HIGH (ref 70–99)
Glucose-Capillary: 271 mg/dL — ABNORMAL HIGH (ref 70–99)
Glucose-Capillary: 256 mg/dL — ABNORMAL HIGH (ref 70–99)

## 2021-09-21 LAB — COMPREHENSIVE METABOLIC PANEL
ALT: 17 U/L (ref 0–44)
AST: 22 U/L (ref 15–41)
Albumin: 3.7 g/dL (ref 3.5–5.0)
Alkaline Phosphatase: 80 U/L (ref 38–126)
Anion gap: 4 — ABNORMAL LOW (ref 5–15)
BUN: 19 mg/dL (ref 8–23)
CO2: 21 mmol/L — ABNORMAL LOW (ref 22–32)
Calcium: 9.5 mg/dL (ref 8.9–10.3)
Chloride: 113 mmol/L — ABNORMAL HIGH (ref 98–111)
Creatinine, Ser: 0.89 mg/dL (ref 0.44–1.00)
GFR, Estimated: >60 mL/min (ref >60)
Glucose, Bld: 275 mg/dL — ABNORMAL HIGH (ref 70–99)
Potassium: 3.8 mmol/L (ref 3.5–5.1)
Sodium: 138 mmol/L (ref 135–145)
Total Bilirubin: 0.9 mg/dL (ref 0.3–1.2)
Total Protein: 7.1 g/dL (ref 6.5–8.1)

## 2021-09-21 LAB — CBC
HCT: 32.9 % — ABNORMAL LOW (ref 36.0–46.0)
HCT: 34.1 % — ABNORMAL LOW (ref 36.0–46.0)
Hemoglobin: 11.3 g/dL — ABNORMAL LOW (ref 12.0–15.0)
Hemoglobin: 11.5 g/dL — ABNORMAL LOW (ref 12.0–15.0)
MCH: 29.1 pg (ref 26.0–34.0)
MCH: 29 pg (ref 26.0–34.0)
MCHC: 34.3 g/dL (ref 30.0–36.0)
MCHC: 33.7 g/dL (ref 30.0–36.0)
MCV: 84.8 fL (ref 80.0–100.0)
MCV: 85.9 fL (ref 80.0–100.0)
Platelets: 218 10*3/uL (ref 150–400)
Platelets: 212 10*3/uL (ref 150–400)
RBC: 3.88 MIL/uL (ref 3.87–5.11)
RBC: 3.97 MIL/uL (ref 3.87–5.11)
RDW: 13.1 % (ref 11.5–15.5)
RDW: 13.1 % (ref 11.5–15.5)
WBC: 14.6 10*3/uL — ABNORMAL HIGH (ref 4.0–10.5)
WBC: 15.2 10*3/uL — ABNORMAL HIGH (ref 4.0–10.5)
nRBC: 0 % (ref 0.0–0.2)
nRBC: 0 % (ref 0.0–0.2)

## 2021-09-21 LAB — FIBRINOGEN
Fibrinogen: 190 mg/dL — ABNORMAL LOW (ref 210–475)
Fibrinogen: 108 mg/dL — ABNORMAL LOW (ref 210–475)

## 2021-09-21 LAB — BUN: BUN: 21 mg/dL (ref 8–23)

## 2021-09-21 LAB — CREATININE, SERUM
Creatinine, Ser: 1.11 mg/dL — ABNORMAL HIGH (ref 0.44–1.00)
GFR, Estimated: 55 mL/min — ABNORMAL LOW (ref >60)

## 2021-09-21 LAB — MRSA NEXT GEN BY PCR, NASAL: MRSA by PCR Next Gen: NOT DETECTED

## 2021-09-21 SURGERY — LOWER EXTREMITY ANGIOGRAPHY
Anesthesia: Moderate Sedation | Site: Leg Lower | Laterality: Left

## 2021-09-21 MED ORDER — GUAIFENESIN-DM 100-10 MG/5ML PO SYRP: 15.0000 mL | ORAL_SOLUTION | ORAL | Status: DC | PRN

## 2021-09-21 MED ORDER — SODIUM CHLORIDE 0.9 % IV SOLN: INTRAVENOUS | Status: DC

## 2021-09-21 MED ORDER — NALOXONE HCL 0.4 MG/ML IJ SOLN: 0.4000 mg | INTRAMUSCULAR | Status: DC | PRN

## 2021-09-21 MED ORDER — PANTOPRAZOLE SODIUM 40 MG PO TBEC
40.0000 mg | DELAYED_RELEASE_TABLET | Freq: Every day | ORAL | Status: DC
Start: 2021-09-21 — End: 2021-09-28
  Administered 2021-09-22 – 2021-09-28 (×7): 40 mg via ORAL
  Filled 2021-09-21 (×7): qty 1

## 2021-09-21 MED ORDER — PHENOL 1.4 % MT LIQD: 1.0000 | OROMUCOSAL | Status: DC | PRN

## 2021-09-21 MED ORDER — FAMOTIDINE 20 MG PO TABS: 40.0000 mg | ORAL_TABLET | Freq: Once | ORAL | Status: DC | PRN

## 2021-09-21 MED ORDER — LABETALOL HCL 5 MG/ML IV SOLN
INTRAVENOUS | Status: AC
  Filled 2021-09-21: qty 4

## 2021-09-21 MED ORDER — HYDROMORPHONE 1 MG/ML IV SOLN
INTRAVENOUS | Status: DC
  Filled 2021-09-21 (×3): qty 30

## 2021-09-21 MED ORDER — HYDROMORPHONE HCL 1 MG/ML IJ SOLN
INTRAMUSCULAR | Status: AC
  Administered 2021-09-21: 1 mg via INTRAVENOUS
  Filled 2021-09-21: qty 1

## 2021-09-21 MED ORDER — GABAPENTIN 300 MG PO CAPS
600.0000 mg | ORAL_CAPSULE | Freq: Two times a day (BID) | ORAL | Status: DC
  Administered 2021-09-21 – 2021-09-28 (×13): 600 mg via ORAL
  Filled 2021-09-21 (×14): qty 2

## 2021-09-21 MED ORDER — CYANOCOBALAMIN 500 MCG PO TABS
500.0000 ug | ORAL_TABLET | Freq: Every day | ORAL | Status: DC
  Administered 2021-09-22 – 2021-09-28 (×7): 500 ug via ORAL
  Filled 2021-09-21 (×7): qty 1

## 2021-09-21 MED ORDER — DIPHENHYDRAMINE HCL 50 MG/ML IJ SOLN: 12.5000 mg | Freq: Four times a day (QID) | INTRAMUSCULAR | Status: DC | PRN

## 2021-09-21 MED ORDER — MIDAZOLAM HCL 2 MG/ML PO SYRP: 8.0000 mg | ORAL_SOLUTION | Freq: Once | ORAL | Status: DC | PRN

## 2021-09-21 MED ORDER — HYDROCODONE-ACETAMINOPHEN 5-325 MG PO TABS: 1.0000 | ORAL_TABLET | Freq: Four times a day (QID) | ORAL | Status: DC | PRN

## 2021-09-21 MED ORDER — MELATONIN 5 MG PO TABS
2.5000 mg | ORAL_TABLET | Freq: Every day | ORAL | Status: DC
  Administered 2021-09-21 – 2021-09-27 (×7): 2.5 mg via ORAL
  Filled 2021-09-21 (×7): qty 1

## 2021-09-21 MED ORDER — ADULT MULTIVITAMIN W/MINERALS CH
1.0000 | ORAL_TABLET | Freq: Every day | ORAL | Status: DC
  Administered 2021-09-22 – 2021-09-28 (×7): 1 via ORAL
  Filled 2021-09-21 (×7): qty 1

## 2021-09-21 MED ORDER — IODIXANOL 320 MG/ML IV SOLN
INTRAVENOUS | Status: DC | PRN
  Administered 2021-09-21: 50 mL

## 2021-09-21 MED ORDER — INSULIN ASPART 100 UNIT/ML IJ SOLN
0.0000 [IU] | Freq: Three times a day (TID) | INTRAMUSCULAR | Status: DC
  Administered 2021-09-21: 8 [IU] via SUBCUTANEOUS
  Administered 2021-09-22: 5 [IU] via SUBCUTANEOUS
  Administered 2021-09-23 – 2021-09-24 (×4): 3 [IU] via SUBCUTANEOUS
  Administered 2021-09-26: 8 [IU] via SUBCUTANEOUS
  Administered 2021-09-26: 2 [IU] via SUBCUTANEOUS
  Administered 2021-09-26 – 2021-09-27 (×2): 3 [IU] via SUBCUTANEOUS
  Administered 2021-09-27: 5 [IU] via SUBCUTANEOUS
  Administered 2021-09-27: 2 [IU] via SUBCUTANEOUS
  Administered 2021-09-28: 3 [IU] via SUBCUTANEOUS
  Administered 2021-09-28: 2 [IU] via SUBCUTANEOUS
  Filled 2021-09-21 (×13): qty 1

## 2021-09-21 MED ORDER — HEPARIN (PORCINE) 25000 UT/250ML-% IV SOLN
600.0000 [IU]/h | INTRAVENOUS | Status: DC
Start: 2021-09-21 — End: 2021-09-22

## 2021-09-21 MED ORDER — LABETALOL HCL 5 MG/ML IV SOLN
10.0000 mg | INTRAVENOUS | Status: AC | PRN
  Administered 2021-09-21 (×4): 10 mg via INTRAVENOUS
  Filled 2021-09-21 (×2): qty 4

## 2021-09-21 MED ORDER — ROSUVASTATIN CALCIUM 5 MG PO TABS: 5.0000 mg | ORAL_TABLET | Freq: Every day | ORAL | Status: DC

## 2021-09-21 MED ORDER — FENTANYL CITRATE PF 50 MCG/ML IJ SOSY
PREFILLED_SYRINGE | INTRAMUSCULAR | Status: AC
  Filled 2021-09-21: qty 1

## 2021-09-21 MED ORDER — LORATADINE 10 MG PO TABS
10.0000 mg | ORAL_TABLET | Freq: Every day | ORAL | Status: DC
  Administered 2021-09-25 – 2021-09-28 (×4): 10 mg via ORAL
  Filled 2021-09-21 (×7): qty 1

## 2021-09-21 MED ORDER — ONDANSETRON HCL 4 MG/2ML IJ SOLN
4.0000 mg | Freq: Four times a day (QID) | INTRAMUSCULAR | Status: DC | PRN
  Administered 2021-09-21: 4 mg via INTRAVENOUS

## 2021-09-21 MED ORDER — METOPROLOL TARTRATE 5 MG/5ML IV SOLN: 2.0000 mg | INTRAVENOUS | Status: DC | PRN

## 2021-09-21 MED ORDER — ONDANSETRON HCL 4 MG/2ML IJ SOLN
INTRAMUSCULAR | Status: AC
  Filled 2021-09-21: qty 2

## 2021-09-21 MED ORDER — POTASSIUM CHLORIDE CRYS ER 20 MEQ PO TBCR: 20.0000 meq | EXTENDED_RELEASE_TABLET | Freq: Once | ORAL | Status: DC

## 2021-09-21 MED ORDER — DIPHENHYDRAMINE HCL 50 MG/ML IJ SOLN: 50.0000 mg | Freq: Once | INTRAMUSCULAR | Status: DC | PRN

## 2021-09-21 MED ORDER — ACETAMINOPHEN 325 MG PO TABS: 650.0000 mg | ORAL_TABLET | Freq: Four times a day (QID) | ORAL | Status: DC | PRN

## 2021-09-21 MED ORDER — EMPAGLIFLOZIN 10 MG PO TABS
10.0000 mg | ORAL_TABLET | Freq: Every day | ORAL | Status: DC
Start: 2021-09-22 — End: 2021-09-23
  Administered 2021-09-22: 10 mg via ORAL
  Filled 2021-09-21 (×2): qty 1

## 2021-09-21 MED ORDER — LABETALOL HCL 5 MG/ML IV SOLN
INTRAVENOUS | Status: DC | PRN
  Administered 2021-09-21: 20 mg via INTRAVENOUS

## 2021-09-21 MED ORDER — HYDROMORPHONE 1 MG/ML IV SOLN: INTRAVENOUS | Status: DC

## 2021-09-21 MED ORDER — MIDAZOLAM HCL 2 MG/2ML IJ SOLN
INTRAMUSCULAR | Status: AC
  Filled 2021-09-21: qty 4

## 2021-09-21 MED ORDER — DIPHENHYDRAMINE HCL 50 MG/ML IJ SOLN
INTRAMUSCULAR | Status: AC
  Filled 2021-09-21: qty 1

## 2021-09-21 MED ORDER — FAMOTIDINE 20 MG PO TABS
20.0000 mg | ORAL_TABLET | Freq: Every day | ORAL | Status: DC
  Administered 2021-09-21 – 2021-09-27 (×7): 20 mg via ORAL
  Filled 2021-09-21 (×7): qty 1

## 2021-09-21 MED ORDER — ATORVASTATIN CALCIUM 20 MG PO TABS
10.0000 mg | ORAL_TABLET | Freq: Every day | ORAL | Status: DC
  Administered 2021-09-22 – 2021-09-28 (×7): 10 mg via ORAL
  Filled 2021-09-21 (×7): qty 1

## 2021-09-21 MED ORDER — HYDROMORPHONE HCL 1 MG/ML IJ SOLN
1.0000 mg | Freq: Once | INTRAMUSCULAR | Status: AC
  Administered 2021-09-21: 1 mg via INTRAVENOUS
  Filled 2021-09-21: qty 1

## 2021-09-21 MED ORDER — DOCUSATE SODIUM 100 MG PO CAPS
100.0000 mg | ORAL_CAPSULE | Freq: Two times a day (BID) | ORAL | Status: DC
  Administered 2021-09-21 – 2021-09-28 (×11): 100 mg via ORAL
  Filled 2021-09-21 (×13): qty 1

## 2021-09-21 MED ORDER — PREDNISONE 10 MG PO TABS
5.0000 mg | ORAL_TABLET | Freq: Every day | ORAL | Status: DC
  Administered 2021-09-22 – 2021-09-28 (×7): 5 mg via ORAL
  Filled 2021-09-21 (×7): qty 1

## 2021-09-21 MED ORDER — FENTANYL CITRATE PF 50 MCG/ML IJ SOSY
PREFILLED_SYRINGE | INTRAMUSCULAR | Status: AC
  Filled 2021-09-21: qty 2

## 2021-09-21 MED ORDER — LISINOPRIL 10 MG PO TABS
10.0000 mg | ORAL_TABLET | Freq: Every day | ORAL | Status: DC
  Administered 2021-09-22 – 2021-09-28 (×7): 10 mg via ORAL
  Filled 2021-09-21 (×4): qty 1
  Filled 2021-09-21: qty 2
  Filled 2021-09-21 (×2): qty 1

## 2021-09-21 MED ORDER — DULOXETINE HCL 30 MG PO CPEP
30.0000 mg | ORAL_CAPSULE | Freq: Every day | ORAL | Status: DC
  Administered 2021-09-21 – 2021-09-28 (×8): 30 mg via ORAL
  Filled 2021-09-21 (×8): qty 1

## 2021-09-21 MED ORDER — FERROUS SULFATE 325 (65 FE) MG PO TABS
325.0000 mg | ORAL_TABLET | Freq: Every day | ORAL | Status: DC
  Administered 2021-09-22 – 2021-09-28 (×7): 325 mg via ORAL
  Filled 2021-09-21 (×7): qty 1

## 2021-09-21 MED ORDER — HYDROMORPHONE HCL 1 MG/ML IJ SOLN
1.0000 mg | Freq: Once | INTRAMUSCULAR | Status: AC | PRN
  Administered 2021-09-21: 1 mg via INTRAVENOUS

## 2021-09-21 MED ORDER — METHYLPREDNISOLONE SODIUM SUCC 125 MG IJ SOLR: 125.0000 mg | Freq: Once | INTRAMUSCULAR | Status: DC | PRN

## 2021-09-21 MED ORDER — ACETAMINOPHEN 325 MG PO TABS
650.0000 mg | ORAL_TABLET | ORAL | Status: DC
  Administered 2021-09-22 – 2021-09-28 (×25): 650 mg via ORAL
  Filled 2021-09-21 (×26): qty 2

## 2021-09-21 MED ORDER — HEPARIN SODIUM (PORCINE) 1000 UNIT/ML IJ SOLN
INTRAMUSCULAR | Status: AC
  Filled 2021-09-21: qty 10

## 2021-09-21 MED ORDER — MORPHINE SULFATE 1 MG/ML IV SOLN PCA
INTRAVENOUS | Status: DC
  Filled 2021-09-21: qty 30

## 2021-09-21 MED ORDER — ALTEPLASE 1 MG/ML SYRINGE FOR VASCULAR PROCEDURE
INTRAMUSCULAR | Status: DC | PRN
  Administered 2021-09-21: 10 mg via INTRA_ARTERIAL

## 2021-09-21 MED ORDER — MIDAZOLAM HCL 2 MG/2ML IJ SOLN
INTRAMUSCULAR | Status: DC | PRN
  Administered 2021-09-21 (×2): .5 mg via INTRAVENOUS
  Administered 2021-09-21: 2 mg via INTRAVENOUS
  Administered 2021-09-21 (×2): .5 mg via INTRAVENOUS

## 2021-09-21 MED ORDER — VANCOMYCIN HCL IN DEXTROSE 1-5 GM/200ML-% IV SOLN
1000.0000 mg | INTRAVENOUS | Status: AC
  Administered 2021-09-21: 1000 mg via INTRAVENOUS
  Filled 2021-09-21: qty 200

## 2021-09-21 MED ORDER — SODIUM CHLORIDE 0.9 % IV SOLN
1.0000 mg/h | INTRAVENOUS | Status: AC
  Administered 2021-09-21: 1 mg/h
  Filled 2021-09-21: qty 10

## 2021-09-21 MED ORDER — ALBUTEROL SULFATE (2.5 MG/3ML) 0.083% IN NEBU: 3.0000 mL | INHALATION_SOLUTION | Freq: Four times a day (QID) | RESPIRATORY_TRACT | Status: DC | PRN

## 2021-09-21 MED ORDER — FOLIC ACID 1 MG PO TABS
1.0000 mg | ORAL_TABLET | Freq: Every day | ORAL | Status: DC
  Administered 2021-09-22 – 2021-09-28 (×7): 1 mg via ORAL
  Filled 2021-09-21 (×7): qty 1

## 2021-09-21 MED ORDER — ONDANSETRON HCL 4 MG/2ML IJ SOLN
4.0000 mg | Freq: Four times a day (QID) | INTRAMUSCULAR | Status: DC | PRN
Start: 2021-09-21 — End: 2021-09-21

## 2021-09-21 MED ORDER — BUPROPION HCL ER (SR) 150 MG PO TB12
150.0000 mg | ORAL_TABLET | Freq: Two times a day (BID) | ORAL | Status: DC
  Administered 2021-09-21 – 2021-09-28 (×13): 150 mg via ORAL
  Filled 2021-09-21 (×14): qty 1

## 2021-09-21 MED ORDER — HYDROXYCHLOROQUINE SULFATE 200 MG PO TABS
200.0000 mg | ORAL_TABLET | Freq: Every day | ORAL | Status: DC
  Administered 2021-09-22 – 2021-09-28 (×7): 200 mg via ORAL
  Filled 2021-09-21 (×7): qty 1

## 2021-09-21 MED ORDER — ALTEPLASE 2 MG IJ SOLR
INTRAMUSCULAR | Status: AC
  Filled 2021-09-21: qty 10

## 2021-09-21 MED ORDER — SODIUM CHLORIDE 0.9 % IV SOLN
0.5000 mg/h | INTRAVENOUS | Status: DC
  Administered 2021-09-21: 0.5 mg/h
  Filled 2021-09-21 (×2): qty 10

## 2021-09-21 MED ORDER — HYDROMORPHONE HCL 1 MG/ML IJ SOLN
1.0000 mg | INTRAMUSCULAR | Status: DC | PRN
  Administered 2021-09-21: 1 mg via INTRAVENOUS
  Filled 2021-09-21: qty 1

## 2021-09-21 MED ORDER — HYDROMORPHONE HCL 1 MG/ML IJ SOLN
INTRAMUSCULAR | Status: AC
  Filled 2021-09-21: qty 1

## 2021-09-21 MED ORDER — FENTANYL CITRATE (PF) 100 MCG/2ML IJ SOLN
INTRAMUSCULAR | Status: DC | PRN
Start: 2021-09-21 — End: 2021-09-21
  Administered 2021-09-21: 50 ug via INTRAVENOUS
  Administered 2021-09-21 (×4): 25 ug via INTRAVENOUS

## 2021-09-21 MED ORDER — HEPARIN SODIUM (PORCINE) 1000 UNIT/ML IJ SOLN
INTRAMUSCULAR | Status: DC | PRN
  Administered 2021-09-21: 5000 [IU] via INTRAVENOUS

## 2021-09-21 MED ORDER — DIPHENHYDRAMINE HCL 12.5 MG/5ML PO ELIX
12.5000 mg | ORAL_SOLUTION | Freq: Four times a day (QID) | ORAL | Status: DC | PRN
  Filled 2021-09-21: qty 5

## 2021-09-21 MED ORDER — CHLORHEXIDINE GLUCONATE CLOTH 2 % EX PADS
6.0000 | MEDICATED_PAD | Freq: Every day | CUTANEOUS | Status: DC
  Administered 2021-09-21 – 2021-09-26 (×6): 6 via TOPICAL

## 2021-09-21 MED ORDER — SODIUM CHLORIDE 0.9% FLUSH: 9.0000 mL | INTRAVENOUS | Status: DC | PRN

## 2021-09-21 MED ORDER — DIPHENHYDRAMINE HCL 50 MG/ML IJ SOLN
INTRAMUSCULAR | Status: DC | PRN
  Administered 2021-09-21: 50 mg via INTRAVENOUS

## 2021-09-21 MED ORDER — HEPARIN (PORCINE) 25000 UT/250ML-% IV SOLN
INTRAVENOUS | Status: AC
  Administered 2021-09-21: 600 [IU]/h via INTRA_ARTERIAL
  Filled 2021-09-21: qty 250

## 2021-09-21 MED ORDER — HYDRALAZINE HCL 20 MG/ML IJ SOLN
5.0000 mg | INTRAMUSCULAR | Status: AC | PRN
  Administered 2021-09-21 (×2): 5 mg via INTRAVENOUS
  Filled 2021-09-21 (×2): qty 1

## 2021-09-21 MED ORDER — ALUM & MAG HYDROXIDE-SIMETH 200-200-20 MG/5ML PO SUSP: 15.0000 mL | ORAL | Status: DC | PRN

## 2021-09-21 SURGICAL SUPPLY — 23 items
BALLN LUTONIX 018 5X300X130 (BALLOONS) ×2
BALLN ULTRVRSE 2.5X300X150 (BALLOONS) ×2
BALLOON LUTONIX 018 5X300X130 (BALLOONS) IMPLANT
BALLOON ULTRVRSE 2.5X300X150 (BALLOONS) IMPLANT
BIOPATCH RED 1 DISK 7.0 (GAUZE/BANDAGES/DRESSINGS) ×1 IMPLANT
CATH ANGIO 5F PIGTAIL 65CM (CATHETERS) ×1 IMPLANT
CATH BEACON 5 .038 100 VERT TP (CATHETERS) ×1 IMPLANT
CATH INFUS 135CMX50CM (CATHETERS) ×1 IMPLANT
CATH ROTAREX 135 6FR (CATHETERS) ×1 IMPLANT
COVER PROBE U/S 5X48 (MISCELLANEOUS) ×2 IMPLANT
GLIDEWIRE ADV .035X260CM (WIRE) ×1 IMPLANT
KIT CV MULTILUMEN 7FR 20 (SET/KITS/TRAYS/PACK) ×2
KIT CV MULTILUMEN 7FR 20 SUB (SET/KITS/TRAYS/PACK) IMPLANT
KIT ENCORE 26 ADVANTAGE (KITS) ×1 IMPLANT
PACK ANGIOGRAPHY (CUSTOM PROCEDURE TRAY) ×2 IMPLANT
SHEATH ANL2 6FRX45 HC (SHEATH) IMPLANT
SHEATH BRITE TIP 5FRX11 (SHEATH) ×1 IMPLANT
SHEATH PINNACLE ST 6F 45CM (SHEATH) ×1 IMPLANT
SUT PROLENE 0 CT 1 30 (SUTURE) ×1 IMPLANT
SYR MEDRAD MARK 7 150ML (SYRINGE) ×1 IMPLANT
TUBING CONTRAST HIGH PRESS 72 (TUBING) ×1 IMPLANT
WIRE G V18X300CM (WIRE) ×1 IMPLANT
WIRE GUIDERIGHT .035X150 (WIRE) ×1 IMPLANT

## 2021-09-21 NOTE — Progress Notes (Signed)
Spoke with Dr. Wyn Quaker via telephone regarding pts plan of care.  After assessing pt she is in severe pain s/p aortogram; LLE angiogram, percutaneous transluminal angioplasty, and mechanical thrombectomy.  She is alert and oriented, in no respiratory distress O2 sats 97% on RA.  I DO NOT recommend precedex gtt at this time.  Recommend starting reduced dose dilaudid pca for pain control.  Dr. Wyn Quaker stated he was agreeable with this plan.  If the pt develops delirium/agitation despite pain control PCCM team will officially consult and start precedex gtt.  Zada Girt, AGNP  Pulmonary/Critical Care Pager [phone] (please enter 7 digits) PCCM Consult Pager [phone] (please enter 7 digits)

## 2021-09-21 NOTE — Consult Note (Signed)
NAME:  Conda Wannamaker, MRN:  [id number], DOB:  Aug 02, 1955, LOS: 0 ADMISSION DATE:  09/21/2021, CONSULTATION DATE:  09/21/21 REFERRING MD:  VVS, CHIEF COMPLAINT:  Leg pain   History of Present Illness:  66 year old woman with hx of DM, PVD, chronic pain related to both and prior RLE BKA who persents with acute on chronic left leg pain.  She underwent angiogram with intervention as outlined by Dr. Wyn Quaker earlier this afternoon.  Postop course complicated by uncontrolled HTN and pain.  Pain is left calf, sharp, waxing/waning, no radiation, nonpositional. On reduced dose PCA as well as PTA gabapentin and cymbalta.  PCCM consulted to additional interventions should they be needed.  Pertinent  Medical History  DM2- last A1c 8.7% Sept 2022 COPD no PFTs on file PVD GERD Anxiety RA on MTX/plaquenil, pred chronically as well as TNF blocker followed by [facility] Events: Including procedures, antibiotic start and stop dates in addition to other pertinent events   09/22/11 angiogram w/ angioplasty, TPA administration, PCCM consult  Interim History / Subjective:  Awake, alert, interactive, in pain.  Objective   Blood pressure (!) 183/109, pulse 85, temperature 97.9 F (36.6 C), temperature source Oral, resp. rate 13, height  (1.676 m), weight 62.5 kg, SpO2 100 %.        Intake/Output Summary (Last 24 hours) at 09/21/2021 2247 Last data filed at 09/21/2021 2100 Gross per 24 hour  Intake 396.51 ml  Output 750 ml  Net -353.49 ml   Filed Weights   09/21/21 1655  Weight: 62.5 kg    Examination: General: mild distress grimacing HENT: MMM, trachea midline Lungs: clear, no wheezing or accessory muscle use Cardiovascular: RRR, ext warm Abdomen: soft, hypoactive BS Extremities: R BKA, R groin CVC in place, LLE neurovascularly intact,  Neuro: Moves upper ext briskly to command Psych: AOx3  Resolved Hospital Problem list   N/A  Assessment  Postop pain and  pain-related HTN Uncontrolled DM2 Severe LLE PVD POD 0 angioplasty with continuous TPA administration RLE PVD post BKA Baseline opiate/gabapentin use Baseline RA on multiple DMARDs, chronic steroids (pred 5), manifested in R shoulder and hands COPD mild by hx GERD  No issues with LOC or EtCO2, will try being more aggressive w/ PCA dosing and pain control prior to initiation of further antihypertensives and/or adjunctive therapies (precedex, ketamine etc)  Plan - Switch from reduced dose dilaudid PCA to full dose, add basal rate  with  loading dose, RN to notify me if not adequate - Continue EtCO2, notify if rising - DC PRN percocet, DC PRN dialudid, start standing tylenol - Continue PTA cymbalta and gabapentin - May need long acting insulin while in stress state, her A1c indicates she may need this at home regardless, oral antiglycemics not enough - All other home meds as ordered - Will follow at least through night to assure pain control, discussed with VVS cross-covering and RN  Best Practice   Per primary  Labs   CBC: Recent Labs  Lab 09/21/21 1811 09/21/21 2110  WBC 14.6* 15.2*  HGB 11.3* 11.5*  HCT 32.9* 34.1*  MCV 84.8 85.9  PLT 218 212    Basic Metabolic Panel: Recent Labs  Lab 09/21/21 1209 09/21/21 1811  NA  --  138  K  --  3.8  CL  --  113*  CO2  --  21*  GLUCOSE  --  275*  BUN 21 19  CREATININE 1.11* 0.89  CALCIUM  --  9.5   GFR: Estimated Creatinine Clearance: 58.2 mL/min (by C-G formula based on SCr of 0.89 mg/dL). Recent Labs  Lab 09/21/21 1811 09/21/21 2110  WBC 14.6* 15.2*    Liver Function Tests: Recent Labs  Lab 09/21/21 1811  AST 22  ALT 17  ALKPHOS 80  BILITOT 0.9  PROT 7.1  ALBUMIN 3.7   No results for input(s): LIPASE, AMYLASE in the last 168 hours. No results for input(s): AMMONIA in the last 168 hours.  ABG No results found for: PHART, PCO2ART, PO2ART, HCO3, TCO2, ACIDBASEDEF, O2SAT   Coagulation  Profile: Recent Labs  Lab 09/21/21 1811  INR 1.3*    Cardiac Enzymes: No results for input(s): CKTOTAL, CKMB, CKMBINDEX, TROPONINI in the last 168 hours.  HbA1C: Hgb A1c MFr Bld  Date/Time Value Ref Range Status  12/25/2020 04:48 PM 8.7 (H) 4.8 - 5.6 % Final    Comment:    (NOTE) Pre diabetes:          5.7%-6.4%  Diabetes:              >6.4%  Glycemic control for   <7.0% adults with diabetes   07/25/2019 01:41 PM 6.4 (H) 4.8 - 5.6 % Final    Comment:    (NOTE)         Prediabetes: 5.7 - 6.4         Diabetes: >6.4         Glycemic control for adults with diabetes: <7.0     CBG: Recent Labs  Lab 09/21/21 1348 09/21/21 1900 09/21/21 2058  GLUCAP 224* 271* 256*    Review of Systems:    Positive Symptoms in bold:  Constitutional fevers, chills, weight loss, fatigue, anorexia, malaise  Eyes decreased vision, double vision, eye irritation  Ears, Nose, Mouth, Throat sore throat, trouble swallowing, sinus congestion  Cardiovascular chest pain, paroxysmal nocturnal dyspnea, lower ext edema, palpitations   Respiratory SOB, cough, DOE, hemoptysis, wheezing  Gastrointestinal nausea, vomiting, diarrhea  Genitourinary burning with urination, trouble urinating  Musculoskeletal joint aches, joint swelling, back pain  Integumentary  rashes, skin lesions  Neurological focal weakness, focal numbness, trouble speaking, headaches  Psychiatric depression, anxiety, confusion  Endocrine polyuria, polydipsia, cold intolerance, heat intolerance  Hematologic abnormal bruising, abnormal bleeding, unexplained nose bleeds  Allergic/Immunologic recurrent infections, hives, swollen lymph nodes     Past Medical History:  She,  has a past medical history of Anemia of chronic disease (05/16/2019), Anxiety, COPD (chronic obstructive pulmonary disease) (HCC), Diabetes mellitus without complication (HCC), GERD (gastroesophageal reflux disease), and Hypertension.   Surgical History:   Past  Surgical History:  Procedure Laterality Date   ABDOMINAL HYSTERECTOMY     AMPUTATION Right 12/27/2018   Procedure: AMPUTATION BELOW KNEE;  Surgeon: Annice Needyew, Jason S, MD;  Location: [facility];  Service: General;  Laterality: Right;   CATARACT EXTRACTION W/PHACO Right 03/27/2020   Procedure: CATARACT EXTRACTION PHACO AND INTRAOCULAR LENS PLACEMENT (IOC) RIGHT DIABETIC 7.54 00:52.5;  Surgeon: Galen ManilaPorfilio, William, MD;  [facility];  Service: Ophthalmology;  Laterality: Right;   CATARACT EXTRACTION W/PHACO Left 05/20/2020   Procedure: CATARACT EXTRACTION PHACO AND INTRAOCULAR LENS PLACEMENT (IOC) LEFT DIABETIC 4.95 00:37.6;  Surgeon: Galen ManilaPorfilio, William, MD;  [facility];  Service: Ophthalmology;  Laterality: Left;  Diabetic - oral meds COVID + 04-24-20   CHOLECYSTECTOMY     COLONOSCOPY WITH PROPOFOL N/A 04/16/2019   Procedure: COLONOSCOPY WITH PROPOFOL;  Surgeon: Pasty Spillersahiliani, Varnita B, MD;  Location: [facility];  Service: Endoscopy;  Laterality: N/A;   COLONOSCOPY WITH PROPOFOL N/A 01/16/2020   Procedure: COLONOSCOPY WITH PROPOFOL;  Surgeon: Pasty Spillers, MD;  Location: [facility];  Service: Endoscopy;  Laterality: N/A;   ESOPHAGOGASTRODUODENOSCOPY (EGD) WITH PROPOFOL N/A 06/15/2018   Procedure: ESOPHAGOGASTRODUODENOSCOPY (EGD) WITH PROPOFOL;  Surgeon: Toledo, Boykin Nearing, MD;  Location: [facility];  Service: Gastroenterology;  Laterality: N/A;   ESOPHAGOGASTRODUODENOSCOPY (EGD) WITH PROPOFOL N/A 01/03/2019   Procedure: ESOPHAGOGASTRODUODENOSCOPY (EGD) WITH PROPOFOL;  Surgeon: Midge Minium, MD;  Location: [facility];  Service: Endoscopy;  Laterality: N/A;   GIVENS CAPSULE STUDY  04/16/2019   Procedure: GIVENS CAPSULE STUDY;  Surgeon: Pasty Spillers, MD;  Location: [facility];  Service: Endoscopy;;   GIVENS CAPSULE STUDY N/A 06/25/2019   Procedure: GIVENS CAPSULE STUDY;  Surgeon: Wyline Mood, MD;  Location: [facility];  Service: Gastroenterology;   Laterality: N/A;   LOWER EXTREMITY ANGIOGRAPHY Right 08/21/2018   Procedure: LOWER EXTREMITY ANGIOGRAPHY;  Surgeon: Annice Needy, MD;  Location: [facility];  Service: Cardiovascular;  Laterality: Right;   LOWER EXTREMITY ANGIOGRAPHY Left 08/28/2018   Procedure: LOWER EXTREMITY ANGIOGRAPHY;  Surgeon: Annice Needy, MD;  Location: [facility];  Service: Cardiovascular;  Laterality: Left;   LOWER EXTREMITY ANGIOGRAPHY Right 08/28/2018   Procedure: Lower Extremity Angiography;  Surgeon: Annice Needy, MD;  Location: [facility];  Service: Cardiovascular;  Laterality: Right;   LOWER EXTREMITY ANGIOGRAPHY Right 12/18/2018   Procedure: Lower Extremity Angiography;  Surgeon: Annice Needy, MD;  Location: [facility];  Service: Cardiovascular;  Laterality: Right;   LOWER EXTREMITY ANGIOGRAPHY Right 12/18/2018   Procedure: Lower Extremity Angiography;  Surgeon: Annice Needy, MD;  Location: [facility];  Service: Cardiovascular;  Laterality: Right;   LOWER EXTREMITY ANGIOGRAPHY Left 12/21/2018   Procedure: Lower Extremity Angiography;  Surgeon: Annice Needy, MD;  Location: [facility];  Service: Cardiovascular;  Laterality: Left;   LOWER EXTREMITY ANGIOGRAPHY Right 12/21/2018   Procedure: Lower Extremity Angiography;  Surgeon: Annice Needy, MD;  Location: [facility];  Service: Cardiovascular;  Laterality: Right;   LOWER EXTREMITY ANGIOGRAPHY Left 12/25/2020   Procedure: LOWER EXTREMITY ANGIOGRAPHY;  Surgeon: Annice Needy, MD;  Location: [facility];  Service: Cardiovascular;  Laterality: Left;   LOWER EXTREMITY INTERVENTION N/A 12/22/2018   Procedure: LOWER EXTREMITY INTERVENTION;  Surgeon: Annice Needy, MD;  Location: [facility];  Service: Cardiovascular;  Laterality: N/A;   LOWER EXTREMITY INTERVENTION Left 12/26/2020   Procedure: LOWER EXTREMITY INTERVENTION;  Surgeon: Annice Needy, MD;  Location: [facility];  Service:  Cardiovascular;  Laterality: Left;     Social History:   reports that she has been smoking cigarettes. She has a 11.25 pack-year smoking history. She has never used smokeless tobacco. She reports that she does not currently use drugs. She reports that she does not drink alcohol.   Family History:  Her family history includes Breast cancer (age of onset: 59) in her mother.   Allergies Allergies  Allergen Reactions   Vancomycin Rash    Patient developed a rash to injection site and arm a few mintes after starting ABX.    Hydrocodone Rash   Metformin And Related Diarrhea   Penicillins Hives    Has patient had a PCN reaction causing immediate rash, facial/tongue/throat swelling, SOB or lightheadedness with hypotension: Yes Has patient had a PCN reaction causing severe rash involving mucus membranes or skin necrosis:  No Has patient had a PCN reaction that required hospitalization: No Has patient had a PCN reaction occurring within the last 10 years: No If all of the above answers are "NO", then may proceed with Cephalosporin use.   Tramadol Itching     Home Medications  Prior to Admission medications   Medication Sig Start Date End Date Taking? Authorizing Provider  atorvastatin (LIPITOR) 10 MG tablet Take 10 mg by mouth daily. 09/04/21  Yes [provider]  buPROPion (ZYBAN) 150 MG 12 hr tablet Take 150 mg by mouth 2 (two) times daily. 08/24/21  Yes [provider]  cetirizine (ZYRTEC) 10 MG tablet Take 10 mg by mouth daily.   Yes [provider]  clopidogrel (PLAVIX) 75 MG tablet Take 1 tablet (75 mg total) by mouth daily. 12/31/20  Yes Stegmayer, Ranae Plumber, PA-C  DULoxetine (CYMBALTA) 30 MG capsule Take 30 mg by mouth daily.   Yes [provider]  ELIQUIS 2.5 MG TABS tablet TAKE 1 TABLET BY MOUTH  TWICE DAILY 03/27/21  Yes Georgiana Spinner, NP  ferrous sulfate 325 (65 FE) MG tablet Take 325 mg by mouth daily.    Yes [provider]  folic acid  (FOLVITE) 1 MG tablet Take 1 mg by mouth daily. 08/13/20  Yes [provider]  gabapentin (NEURONTIN) 300 MG capsule TAKE 2 CAPSULES BY MOUTH IN THE  MORNING AND 3 CAPSULES BY MOUTH  IN THE EVENING 04/21/21  Yes Georgiana Spinner, NP  hydroxychloroquine (PLAQUENIL) 200 MG tablet Take 200 mg by mouth daily. 01/22/21  Yes [provider]  JARDIANCE 10 MG TABS tablet Take 10 mg by mouth daily. 05/08/20  Yes [provider]  lisinopril (ZESTRIL) 10 MG tablet Take 1 tablet (10 mg total) by mouth daily. 07/26/19  Yes Arnetha Courser, MD  sitaGLIPtin (JANUVIA) 100 MG tablet Take 100 mg by mouth daily.   Yes [provider]  VICTOZA 18 MG/3ML SOPN Inject into the skin. 09/04/21  Yes [provider]  vitamin B-12 (CYANOCOBALAMIN) 500 MCG tablet Take 1 tablet (500 mcg total) by mouth daily. 11/26/19  Yes Rickard Patience, MD  acetaminophen (TYLENOL) 325 MG tablet Take 650 mg by mouth every 6 (six) hours as needed.    [provider]  albuterol (PROVENTIL HFA;VENTOLIN HFA) 108 (90 Base) MCG/ACT inhaler Inhale 2 puffs into the lungs every 6 (six) hours as needed for wheezing or shortness of breath. 08/20/17   Jeanmarie Plant, MD  docusate sodium (COLACE) 100 MG capsule Take 100 mg by mouth 2 (two) times daily.    [provider]  famotidine (PEPCID) 20 MG tablet Take 1 tablet (20 mg total) by mouth 2 (two) times daily. 10/25/19 11/24/19  Pasty Spillers, MD  HYDROcodone-acetaminophen (NORCO/VICODIN) 5-325 MG tablet Take 1 tablet by mouth every 6 (six) hours as needed for moderate pain. Patient not taking: Reported on 09/17/2021 04/27/21 04/27/22  Georgiana Spinner, NP  Melatonin 1 MG TABS Take 1 tablet by mouth at bedtime.    [provider]  methotrexate (RHEUMATREX) 2.5 MG tablet Take 15 mg by mouth once a week. Patient not taking: Reported on 09/17/2021 10/13/20   [provider]  moxifloxacin (VIGAMOX) 0.5 % ophthalmic solution 1 drop 3 (three) times  daily. Patient not taking: Reported on 12/25/2020    [provider]  Multiple Vitamins-Minerals (WOMENS MULTIVITAMIN PO) Take 1 tablet by mouth daily.    [provider]  Na Sulfate-K Sulfate-Mg Sulf 17.5-3.13-1.6  GM/177ML SOLN At 5 PM the day before procedure take 1 bottle and 5 hours before procedure take 1 bottle. Patient not taking: Reported on 02/16/2021 01/09/20   [name] B, MD  prednisoLONE acetate (PRED FORTE) 1 % ophthalmic suspension INSTILL 1 DROP 4 TIMES DAILY AS DIRECTED. BEGINNING AFTER SURGERY Patient not taking: Reported on 12/25/2020 05/27/20   [provider]  predniSONE (DELTASONE) 5 MG tablet Take 5 mg by mouth daily. 06/24/21   [provider]  rosuvastatin (CRESTOR) 5 MG tablet Take 1 tablet (5 mg total) by mouth daily at 6 PM. Patient not taking: Reported on 09/17/2021 07/26/19   Arnetha Courser, MD     Critical care time: N/A

## 2021-09-21 NOTE — Progress Notes (Signed)
Contacted Dr Gilda Crease on call to notify that she is not having pain control w/ current settings/orders on PCA Dilaudid pump. PRN Dilaudid dose given for breakthrough and still no relief. Pain 10/10 now in LLE. She states, "Is there anything else you can give me for pain b/c this is not working?" Continuing to monitor. Awaiting new orders. BP also remaining elevated despite PRN interventions- SEE EMAR.

## 2021-09-21 NOTE — Progress Notes (Signed)
I have personally been in contact with Dr Erskine Emery to formally consult the intensivist.  I reached him at pager [phone] at 10:35 pm.  It is my understanding he agreed to add a precedex gtt.

## 2021-09-21 NOTE — Op Note (Signed)
[facility]  Percutaneous Study/Intervention Procedural Note   Date of Surgery: 09/21/2021  Surgeon(s):Isabella Roemmich    Assistants:none  Pre-operative Diagnosis: PAD with rest pain LLE  Post-operative diagnosis:  Same  Procedure(s) Performed:             1.  Ultrasound guidance for vascular access right femoral artery             2.  Catheter placement into left common femoral artery from right femoral approach             3.  Aortogram and selective left lower extremity angiogram             4.  Mechanical thrombectomy to the left SFA and popliteal arteries with the [place] Rex device             5.  Percutaneous transluminal angioplasty of left anterior tibial artery with 2.5 mm diameter by 30 cm length angioplasty  6.  Percutaneous transluminal angioplasty of left SFA and popliteal arteries with 2 inflations with a 5 mm diameter by 30 cm length [name]             7.  Placement of a 135 cm total length 50 cm working length thrombolytic catheter from the proximal left SFA down to the proximal anterior tibial artery with instillation of 10 mg of tPA  8.  Ultrasound guidance for vascular access right femoral vein with placement of a right femoral triple-lumen venous catheter for venous access  EBL: 100 cc  Contrast: 50 cc  Fluoro Time: 7 minutes  Moderate Conscious Sedation Time: approximately 44 minutes using 4 mg of Versed and 150 mcg of Fentanyl              Indications:  Patient is a 66 y.o.female with an ischemic left lower extremity with multiple previous procedures having already lost her right leg. The patient has noninvasive study showing heartedly reduced perfusion on the left. The patient is brought in for angiography for further evaluation and potential treatment.  Due to the limb threatening nature of the situation, angiogram was performed for attempted limb salvage. The patient is aware that if the procedure fails,  amputation would be expected.  The patient also understands that even with successful revascularization, amputation may still be required due to the severity of the situation.  Risks and benefits are discussed and informed consent is obtained.   Procedure:  The patient was identified and appropriate procedural time out was performed.  The patient was then placed supine on the table and prepped and draped in the usual sterile fashion. Moderate conscious sedation was administered during a face to face encounter with the patient throughout the procedure with my supervision of the RN administering medicines and monitoring the patient's vital signs, pulse oximetry, telemetry and mental status throughout from the start of the procedure until the patient was taken to the recovery room. Ultrasound was used to evaluate the right common femoral artery.  It was patent .  A digital ultrasound image was acquired.  A Seldinger needle was used to access the right common femoral artery under direct ultrasound guidance and a permanent image was performed.  A 0.035 J wire was advanced without resistance and a 5Fr sheath was placed.  Pigtail catheter was placed into the aorta and an AP aortogram was performed. This demonstrated normal renal arteries and an ectatic aorta that was not stenotic.  She had previously placed iliac stents with  mild narrowing of the left common iliac artery above the previously placed stent that did not appear flow-limiting and fairly normal left external iliac artery.  The right iliac artery was mildly irregular but not highly stenotic. I then crossed the aortic bifurcation and advanced to the left femoral head. Selective left lower extremity angiogram was then performed. This demonstrated flush occlusion of the left SFA at the level of the previously placed stents.  There appeared to be some narrowing of the proximal profunda femoris artery as well.  There was faint reconstitution of the popliteal artery  through collaterals, but then this was not continuous down to his tibial vessels.  The proximal portion of the anterior tibial artery and the tibioperoneal trunk were occluded and there was reconstitution of the anterior tibial artery is the dominant runoff distally through collaterals. It was felt that it was in the patient's best interest to proceed with intervention after these images to avoid a second procedure and a larger amount of contrast and fluoroscopy based off of the findings from the initial angiogram. The patient was systemically heparinized and a 6 [place] [employer] sheath was then placed over the [employer] wire. I then used a Kumpe catheter and the advantage wire to navigate into the SFA occlusion and easily cross the SFA, popliteal, and proximal anterior tibial artery occlusion with a Kumpe catheter and the advantage wire and confirm intraluminal flow in the mid anterior tibial artery.  I then placed a 0.018 wire and proceeded with mechanical thrombectomy.  Several passes were made with the [place] Rex device in the left SFA and popliteal arteries down to the origin of the anterior tibial artery.  I then performed balloon angioplasty of the anterior tibial artery in the proximal to mid segment with a 2.5 mm diameter by 30 cm length angioplasty balloon.  The SFA and popliteal was then addressed with 2 inflations with a 5 mm diameter by 30 cm length [name] with each inflation being 8 to 10 atm for 1 minute.  Completion imaging following this showed residual continued thrombosis of the entire left SFA and popliteal arteries with sluggish runoff distally and I felt our best chance of revascularization would be a thrombolytic catheter.  Over the V18 wire, placed 135 cm total length 50 cm working length thrombolytic catheter that started at the origin of the SFA and went down through the popliteal artery and into the proximal portion of the anterior tibial artery.  10  mg of tPA was then instilled through the thrombolytic catheter and it was connected for continuous thrombolytic therapy.  As she was going to receive overnight thrombolytic therapy, durable venous access is indicated.  The right groin was already prepped and draped, so the right femoral vein was visualized with ultrasound and found to be widely patent.  It was then accessed under direct ultrasound guidance without difficulty with a Seldinger needle.  A J-wire was placed.  After skin nick and dilatation a triple-lumen catheter was placed over the wire and the wire was removed.  All 3 lm withdrew dark red nonpulsatile blood and flushed easily with sterile saline and then this was secured into place with 2 silk sutures.  The sheath and the thrombolytic catheter also secured with sutures. I elected to terminate the procedure. The patient was taken to the recovery room in stable condition having tolerated the procedure well.  Findings:               Aortogram:  This demonstrated normal renal arteries and an ectatic aorta that was not stenotic.  She had previously placed iliac stents with mild narrowing of the left common iliac artery above the previously placed stent that did not appear flow-limiting and fairly normal left external iliac artery.  The right iliac artery was mildly irregular but not highly stenotic.             Left lower Extremity: This demonstrated flush occlusion of the left SFA at the level of the previously placed stents.  There appeared to be some narrowing of the proximal profunda femoris artery as well.  There was faint reconstitution of the popliteal artery through collaterals, but then this was not continuous down to his tibial vessels.  The proximal portion of the anterior tibial artery and the tibioperoneal trunk were occluded and there was reconstitution of the anterior tibial artery is the dominant runoff distally through collaterals.   Disposition: Patient was taken to the recovery  room in stable condition having tolerated the procedure well.  Complications: None  Leotis Pain 09/21/2021 1:36 PM   This note was created with Dragon Medical transcription system. Any errors in dictation are purely unintentional.

## 2021-09-21 NOTE — Progress Notes (Signed)
Subjective:    Patient ID: Sylvia [name], female    DOB: April 24, 1955, 66 y.o.   MRN: [id number] No chief complaint on file.   The patient returns to the office for followup and review of the noninvasive studies.   The patient notes that there has been a significant deterioration in the lower extremity symptoms.  The patient notes interval shortening of their claudication distance and development of mild rest pain symptoms. No new ulcers or wounds have occurred since the last visit.  There have been no significant changes to the patient's overall health care.  The patient denies amaurosis fugax or recent TIA symptoms. There are no recent neurological changes noted. There is no history of DVT, PE or superficial thrombophlebitis. The patient denies recent episodes of angina or shortness of breath.   ABI's Rt=n/a and Lt=0.47 (previous ABI's Rt=n/a and Lt=0.93) Duplex US of the lower extremity arterial system shows dampened monophasic waveforms     Review of Systems  Musculoskeletal:  Positive for myalgias.  All other systems reviewed and are negative.     Objective:   Physical Exam Vitals reviewed.  HENT:     Head: Normocephalic.  Cardiovascular:     Rate and Rhythm: Normal rate.     Pulses:          Dorsalis pedis pulses are 0 on the left side.       Posterior tibial pulses are 0 on the left side.  Pulmonary:     Effort: Pulmonary effort is normal.  Musculoskeletal:     Right Lower Extremity: Right leg is amputated below knee.  Skin:    General: Skin is dry.  Neurological:     Mental Status: Sylvia [name] is alert and oriented to person, place, and time.  Psychiatric:        Behavior: Behavior normal.        Thought Content: Thought content normal.        Judgment: Judgment normal.    BP (!) 154/76 (BP Location: Right Arm)   Pulse (!) 106   Resp 17   Ht  (1.702 m)   Wt 141 lb (64 kg)   BMI 22.08 kg/m   Past Medical History:  Diagnosis Date   Anemia of chronic  disease 05/16/2019   Anxiety    h/o   COPD (chronic obstructive pulmonary disease) (HCC)    Diabetes mellitus without complication (HCC)    GERD (gastroesophageal reflux disease)    Hypertension    bp under control-off meds since 2019    Social History   Socioeconomic History   Marital status: Divorced    Spouse name: Not on file   Number of children: 2   Years of education: Not on file   Highest education level: Not on file  Occupational History   Not on file  Tobacco Use   Smoking status: Some Days    Packs/day: 0.25    Years: 45.00    Pack years: 11.25    Types: Cigarettes   Smokeless tobacco: Never   Tobacco comments:    quit  Vaping Use   Vaping Use: Never used  Substance and Sexual Activity   Alcohol use: No   Drug use: Not Currently    Comment: last used in April 2021 per patient   Sexual activity: Not Currently  Other Topics Concern   Not on file  Social History Narrative   5 grandchildren and 5 great grandchildren    2 grandchildren live with Pt. (  25 & 68 y.o.)   Social Determinants of Health   Financial Resource Strain: Not on file  Food Insecurity: Not on file  Transportation Needs: Not on file  Physical Activity: Not on file  Stress: Not on file  Social Connections: Not on file  Intimate Partner Violence: Not on file    Past Surgical History:  Procedure Laterality Date   ABDOMINAL HYSTERECTOMY     AMPUTATION Right 12/27/2018   Procedure: AMPUTATION BELOW KNEE;  Surgeon: Annice Needy, MD;  Location: [facility];  Service: General;  Laterality: Right;   CATARACT EXTRACTION W/PHACO Right 03/27/2020   Procedure: CATARACT EXTRACTION PHACO AND INTRAOCULAR LENS PLACEMENT (IOC) RIGHT DIABETIC 7.54 00:52.5;  Surgeon: Galen Manila, MD;  Location: [facility];  Service: Ophthalmology;  Laterality: Right;   CATARACT EXTRACTION W/PHACO Left 05/20/2020   Procedure: CATARACT EXTRACTION PHACO AND INTRAOCULAR LENS PLACEMENT (IOC) LEFT DIABETIC 4.95 00:37.6;   Surgeon: Galen Manila, MD;  Location: [facility];  Service: Ophthalmology;  Laterality: Left;  Diabetic - oral meds COVID + 04-24-20   CHOLECYSTECTOMY     COLONOSCOPY WITH PROPOFOL N/A 04/16/2019   Procedure: COLONOSCOPY WITH PROPOFOL;  Surgeon: Pasty Spillers, MD;  Location: [facility];  Service: Endoscopy;  Laterality: N/A;   COLONOSCOPY WITH PROPOFOL N/A 01/16/2020   Procedure: COLONOSCOPY WITH PROPOFOL;  Surgeon: Pasty Spillers, MD;  Location: [facility];  Service: Endoscopy;  Laterality: N/A;   ESOPHAGOGASTRODUODENOSCOPY (EGD) WITH PROPOFOL N/A 06/15/2018   Procedure: ESOPHAGOGASTRODUODENOSCOPY (EGD) WITH PROPOFOL;  Surgeon: Toledo, Boykin Nearing, MD;  Location: [facility];  Service: Gastroenterology;  Laterality: N/A;   ESOPHAGOGASTRODUODENOSCOPY (EGD) WITH PROPOFOL N/A 01/03/2019   Procedure: ESOPHAGOGASTRODUODENOSCOPY (EGD) WITH PROPOFOL;  Surgeon: Midge Minium, MD;  Location: [facility];  Service: Endoscopy;  Laterality: N/A;   GIVENS CAPSULE STUDY  04/16/2019   Procedure: GIVENS CAPSULE STUDY;  Surgeon: Pasty Spillers, MD;  Location: [facility];  Service: Endoscopy;;   GIVENS CAPSULE STUDY N/A 06/25/2019   Procedure: GIVENS CAPSULE STUDY;  Surgeon: Wyline Mood, MD;  Location: [facility];  Service: Gastroenterology;  Laterality: N/A;   LOWER EXTREMITY ANGIOGRAPHY Right 08/21/2018   Procedure: LOWER EXTREMITY ANGIOGRAPHY;  Surgeon: Annice Needy, MD;  Location: [facility];  Service: Cardiovascular;  Laterality: Right;   LOWER EXTREMITY ANGIOGRAPHY Left 08/28/2018   Procedure: LOWER EXTREMITY ANGIOGRAPHY;  Surgeon: Annice Needy, MD;  Location: [facility];  Service: Cardiovascular;  Laterality: Left;   LOWER EXTREMITY ANGIOGRAPHY Right 08/28/2018   Procedure: Lower Extremity Angiography;  Surgeon: Annice Needy, MD;  Location: [facility];  Service: Cardiovascular;  Laterality: Right;   LOWER EXTREMITY ANGIOGRAPHY Right  12/18/2018   Procedure: Lower Extremity Angiography;  Surgeon: Annice Needy, MD;  Location: [facility];  Service: Cardiovascular;  Laterality: Right;   LOWER EXTREMITY ANGIOGRAPHY Right 12/18/2018   Procedure: Lower Extremity Angiography;  Surgeon: Annice Needy, MD;  Location: [facility];  Service: Cardiovascular;  Laterality: Right;   LOWER EXTREMITY ANGIOGRAPHY Left 12/21/2018   Procedure: Lower Extremity Angiography;  Surgeon: Annice Needy, MD;  Location: [facility];  Service: Cardiovascular;  Laterality: Left;   LOWER EXTREMITY ANGIOGRAPHY Right 12/21/2018   Procedure: Lower Extremity Angiography;  Surgeon: Annice Needy, MD;  Location: [facility];  Service: Cardiovascular;  Laterality: Right;   LOWER EXTREMITY ANGIOGRAPHY Left 12/25/2020   Procedure: LOWER EXTREMITY ANGIOGRAPHY;  Surgeon: Annice Needy, MD;  Location: [facility]  INVASIVE CV LAB;  Service: Cardiovascular;  Laterality: Left;   LOWER EXTREMITY INTERVENTION N/A 12/22/2018   Procedure: LOWER EXTREMITY INTERVENTION;  Surgeon: Annice Needy, MD;  Location: [facility];  Service: Cardiovascular;  Laterality: N/A;   LOWER EXTREMITY INTERVENTION Left 12/26/2020   Procedure: LOWER EXTREMITY INTERVENTION;  Surgeon: Annice Needy, MD;  Location: [facility];  Service: Cardiovascular;  Laterality: Left;    Family History  Problem Relation Age of Onset   Breast cancer Mother 68    Allergies  Allergen Reactions   Vancomycin Rash    Patient developed a rash to injection site and arm a few mintes after starting ABX.    Hydrocodone Rash   Metformin And Related Diarrhea   Penicillins Hives    Has patient had a PCN reaction causing immediate rash, facial/tongue/throat swelling, SOB or lightheadedness with hypotension: Yes Has patient had a PCN reaction causing severe rash involving mucus membranes or skin necrosis: No Has patient had a PCN reaction that required hospitalization: No Has patient had a  PCN reaction occurring within the last 10 years: No If all of the above answers are "NO", then may proceed with Cephalosporin use.   Tramadol Itching       Latest Ref Rng & Units 12/25/2020    4:48 PM 11/26/2020   11:15 AM 05/28/2020   10:57 AM  CBC  WBC 4.0 - 10.5 K/uL 20.1   8.6   8.8    Hemoglobin 12.0 - 15.0 g/dL 55.9   74.1   63.8    Hematocrit 36.0 - 46.0 % 31.6   32.0   31.1    Platelets 150 - 400 K/uL 225   332   340        CMP     Component Value Date/Time   NA 139 12/25/2020 1648   K 3.8 12/25/2020 1648   CL 107 12/25/2020 1648   CO2 23 12/25/2020 1648   GLUCOSE 232 (H) 12/25/2020 1648   BUN 21 09/21/2021 1209   CREATININE 1.11 (H) 09/21/2021 1209   CALCIUM 9.2 12/25/2020 1648   PROT 7.4 12/25/2020 1648   ALBUMIN 3.9 12/25/2020 1648   AST 21 12/25/2020 1648   ALT 14 12/25/2020 1648   ALKPHOS 92 12/25/2020 1648   BILITOT 0.8 12/25/2020 1648   GFRNONAA 55 (L) 09/21/2021 1209   GFRAA >60 11/23/2019 1505     VAS [place] ABI WITH/WO TBI  Result Date: 09/18/2021  LOWER EXTREMITY DOPPLER STUDY Patient Name:  Lanelle Hashemi  Date of Exam:   09/17/2021 Medical Rec #: [id number]           Accession #:    [id number] Date of Birth: [birth year]            Patient Gender: F Patient Age:   45 years Exam Location:  [facility] Procedure:      VAS [place] ABI WITH/WO TBI Referring Phys: [name] --------------------------------------------------------------------------------  Indications: Peripheral artery disease, and right BKA.  Vascular Interventions: 08/21/18: Right SFA/popliteal artery PTA/stent;                         08/28/18: Left EIA & SFA/popliteal artery stents;                         08/28/18: Right popliteal, TP trunk & peroneal artery  thrombectomies/PTAs;                         12/27/18: Right BKA;                         12/25/20: Left SFA/popliteal thrombectomy/PTA;                         12/26/20: Left proximal SFA thombectomy/stent;. Comparison  Study: 07/21/2021 Performing Technologist: Debbe BalesSolomon Mcclary RVS  Examination Guidelines: A complete evaluation includes at minimum, Doppler waveform signals and systolic blood pressure reading at the level of bilateral brachial, anterior tibial, and posterior tibial arteries, when vessel segments are accessible. Bilateral testing is considered an integral part of a complete examination. Photoelectric Plethysmograph (PPG) waveforms and toe systolic pressure readings are included as required and additional duplex testing as needed. Limited examinations for reoccurring indications may be performed as noted.  ABI Findings: +--------+------------------+-----+--------+--------+ Right   Rt Pressure (mmHg)IndexWaveformComment  +--------+------------------+-----+--------+--------+ [id number]                                     +--------+------------------+-----+--------+--------+ ATA                                    Rt BKA   +--------+------------------+-----+--------+--------+ +---------+------------------+-----+----------+-------+ Left     Lt Pressure (mmHg)IndexWaveform  Comment +---------+------------------+-----+----------+-------+ Brachial 154                                      +---------+------------------+-----+----------+-------+ ATA      73                0.47 monophasic        +---------+------------------+-----+----------+-------+ PTA      67                0.44 monophasic        +---------+------------------+-----+----------+-------+ Great Toe39                0.25 Abnormal          +---------+------------------+-----+----------+-------+ +-------+-----------+-----------+------------+------------+ ABI/TBIToday's ABIToday's TBIPrevious ABIPrevious TBI +-------+-----------+-----------+------------+------------+ Right  Rt BKA                RT BKA                   +-------+-----------+-----------+------------+------------+ Left   .47        .25         .93         .96          +-------+-----------+-----------+------------+------------+ Left ABIs and TBIs appear decreased compared to prior study on 07/21/2021.  Summary: Right: Rt BKA. Left: Resting left ankle-brachial index indicates severe left lower extremity arterial disease. The left toe-brachial index is abnormal.  *See table(s) above for measurements and observations.  Electronically signed by Festus BarrenJason Dew MD on 09/18/2021 at 11:42:58 AM.    Final        Assessment & Plan:   1. Peripheral arterial disease with history of revascularization (HCC) Recommend:  The patient has evidence of severe atherosclerotic changes of both lower extremities with rest pain that is associated with preulcerative changes and impending tissue loss of the  left foot.  This represents a limb threatening ischemia and places the patient at the risk for left limb loss.  Patient should undergo angiography of the left lower extremity with the hope for intervention for limb salvage.  The risks and benefits as well as the alternative therapies was discussed in detail with the patient.  All questions were answered.  Patient agrees to proceed with left lower extremity angiography.  The patient will follow up with me in the office after the procedure.      - VAS [place] ABI WITH/WO TBI  2. Tobacco use disorder Patient was advised for need for absolute smoking sensation.   3. Essential hypertension Continue antihypertensive medications as already ordered, these medications have been reviewed and there are no changes at this time.    No current facility-administered medications on file prior to visit.   Current Outpatient Medications on File Prior to Visit  Medication Sig Dispense Refill   acetaminophen (TYLENOL) 325 MG tablet Take 650 mg by mouth every 6 (six) hours as needed.     albuterol (PROVENTIL HFA;VENTOLIN HFA) 108 (90 Base) MCG/ACT inhaler Inhale 2 puffs into the lungs every 6 (six) hours as needed for wheezing  or shortness of breath. 1 Inhaler 2   atorvastatin (LIPITOR) 10 MG tablet Take 10 mg by mouth daily.     cetirizine (ZYRTEC) 10 MG tablet Take 10 mg by mouth daily.     clopidogrel (PLAVIX) 75 MG tablet Take 1 tablet (75 mg total) by mouth daily. 90 tablet 3   docusate sodium (COLACE) 100 MG capsule Take 100 mg by mouth 2 (two) times daily.     DULoxetine (CYMBALTA) 30 MG capsule Take 30 mg by mouth daily.     ELIQUIS 2.5 MG TABS tablet TAKE 1 TABLET BY MOUTH  TWICE DAILY 120 tablet 5   ferrous sulfate 325 (65 FE) MG tablet Take 325 mg by mouth daily.      folic acid (FOLVITE) 1 MG tablet Take 1 mg by mouth daily.     gabapentin (NEURONTIN) 300 MG capsule TAKE 2 CAPSULES BY MOUTH IN THE  MORNING AND 3 CAPSULES BY MOUTH  IN THE EVENING 450 capsule 3   hydroxychloroquine (PLAQUENIL) 200 MG tablet Take 200 mg by mouth daily.     JARDIANCE 10 MG TABS tablet Take 10 mg by mouth daily.     lisinopril (ZESTRIL) 10 MG tablet Take 1 tablet (10 mg total) by mouth daily. 30 tablet 1   Melatonin 1 MG TABS Take 1 tablet by mouth at bedtime.     Multiple Vitamins-Minerals (WOMENS MULTIVITAMIN PO) Take 1 tablet by mouth daily.     predniSONE (DELTASONE) 5 MG tablet Take 5 mg by mouth daily.     sitaGLIPtin (JANUVIA) 100 MG tablet Take 100 mg by mouth daily.     vitamin B-12 (CYANOCOBALAMIN) 500 MCG tablet Take 1 tablet (500 mcg total) by mouth daily. 90 tablet 1   buPROPion (ZYBAN) 150 MG 12 hr tablet Take 150 mg by mouth 2 (two) times daily.     famotidine (PEPCID) 20 MG tablet Take 1 tablet (20 mg total) by mouth 2 (two) times daily. 60 tablet 1   HYDROcodone-acetaminophen (NORCO/VICODIN) 5-325 MG tablet Take 1 tablet by mouth every 6 (six) hours as needed for moderate pain. (Patient not taking: Reported on 09/17/2021) 20 tablet 0   methotrexate (RHEUMATREX) 2.5 MG tablet Take 15 mg by mouth once a week. (Patient not taking: Reported on 09/17/2021)  moxifloxacin (VIGAMOX) 0.5 % ophthalmic solution 1 drop 3  (three) times daily. (Patient not taking: Reported on 12/25/2020)     Na Sulfate-K Sulfate-Mg Sulf 17.5-3.13-1.6 GM/177ML SOLN At 5 PM the day before procedure take 1 bottle and 5 hours before procedure take 1 bottle. (Patient not taking: Reported on 02/16/2021) 708 mL 0   prednisoLONE acetate (PRED FORTE) 1 % ophthalmic suspension INSTILL 1 DROP 4 TIMES DAILY AS DIRECTED. BEGINNING AFTER SURGERY (Patient not taking: Reported on 12/25/2020)     rosuvastatin (CRESTOR) 5 MG tablet Take 1 tablet (5 mg total) by mouth daily at 6 PM. (Patient not taking: Reported on 09/17/2021) 30 tablet 1   VICTOZA 18 MG/3ML SOPN Inject into the skin.      There are no Patient Instructions on file for this visit. No follow-ups on file.   Georgiana Spinner, NP

## 2021-09-21 NOTE — OR Nursing (Signed)
Pt reporting nausea, severe leg pain and need to urinate. Unable to void lying flat. Zofran 4 iv given. Dilaudid 1 mg iv given and 16 french foley placed.

## 2021-09-22 ENCOUNTER — Encounter
Admission: RE | Disposition: A | Discharge status: Home Health | Payer: Self-pay | Source: Home / Self Care | Attending: Vascular Surgery

## 2021-09-22 ENCOUNTER — Encounter: Payer: Self-pay | Admitting: Vascular Surgery

## 2021-09-22 DIAGNOSIS — I998 Other disorder of circulatory system: Secondary | ICD-10-CM | POA: Diagnosis not present

## 2021-09-22 DIAGNOSIS — T82858A Stenosis of vascular prosthetic devices, implants and grafts, initial encounter: Secondary | ICD-10-CM

## 2021-09-22 DIAGNOSIS — T82868A Thrombosis of vascular prosthetic devices, implants and grafts, initial encounter: Secondary | ICD-10-CM

## 2021-09-22 HISTORY — PX: LOWER EXTREMITY INTERVENTION: CATH118252

## 2021-09-22 LAB — CBC
HCT: 32.7 % — ABNORMAL LOW (ref 36.0–46.0)
HCT: 28.9 % — ABNORMAL LOW (ref 36.0–46.0)
HCT: 28.8 % — ABNORMAL LOW (ref 36.0–46.0)
Hemoglobin: 11.2 g/dL — ABNORMAL LOW (ref 12.0–15.0)
Hemoglobin: 9.6 g/dL — ABNORMAL LOW (ref 12.0–15.0)
Hemoglobin: 9.5 g/dL — ABNORMAL LOW (ref 12.0–15.0)
MCH: 29.5 pg (ref 26.0–34.0)
MCH: 28.7 pg (ref 26.0–34.0)
MCH: 28.7 pg (ref 26.0–34.0)
MCHC: 34.3 g/dL (ref 30.0–36.0)
MCHC: 33.2 g/dL (ref 30.0–36.0)
MCHC: 33 g/dL (ref 30.0–36.0)
MCV: 86.1 fL (ref 80.0–100.0)
MCV: 86.5 fL (ref 80.0–100.0)
MCV: 87 fL (ref 80.0–100.0)
Platelets: 185 10*3/uL (ref 150–400)
Platelets: 171 10*3/uL (ref 150–400)
Platelets: 161 10*3/uL (ref 150–400)
RBC: 3.8 MIL/uL — ABNORMAL LOW (ref 3.87–5.11)
RBC: 3.34 MIL/uL — ABNORMAL LOW (ref 3.87–5.11)
RBC: 3.31 MIL/uL — ABNORMAL LOW (ref 3.87–5.11)
RDW: 13.2 % (ref 11.5–15.5)
RDW: 13.3 % (ref 11.5–15.5)
RDW: 13.2 % (ref 11.5–15.5)
WBC: 13.8 10*3/uL — ABNORMAL HIGH (ref 4.0–10.5)
WBC: 9.4 10*3/uL (ref 4.0–10.5)
WBC: 9.6 10*3/uL (ref 4.0–10.5)
nRBC: 0 % (ref 0.0–0.2)
nRBC: 0 % (ref 0.0–0.2)
nRBC: 0 % (ref 0.0–0.2)

## 2021-09-22 LAB — GLUCOSE, CAPILLARY
Glucose-Capillary: 278 mg/dL — ABNORMAL HIGH (ref 70–99)
Glucose-Capillary: 196 mg/dL — ABNORMAL HIGH (ref 70–99)
Glucose-Capillary: 199 mg/dL — ABNORMAL HIGH (ref 70–99)
Glucose-Capillary: 204 mg/dL — ABNORMAL HIGH (ref 70–99)
Glucose-Capillary: 175 mg/dL — ABNORMAL HIGH (ref 70–99)

## 2021-09-22 LAB — FIBRINOGEN: Fibrinogen: 86 mg/dL — CL (ref 210–475)

## 2021-09-22 LAB — HEMOGLOBIN A1C
Hgb A1c MFr Bld: 8.3 % — ABNORMAL HIGH (ref 4.8–5.6)
Hgb A1c MFr Bld: 8.2 % — ABNORMAL HIGH (ref 4.8–5.6)
Mean Plasma Glucose: 191.51 mg/dL
Mean Plasma Glucose: 188.64 mg/dL

## 2021-09-22 SURGERY — LOWER EXTREMITY INTERVENTION
Anesthesia: Moderate Sedation

## 2021-09-22 MED ORDER — CIPROFLOXACIN IN D5W 400 MG/200ML IV SOLN
INTRAVENOUS | Status: DC | PRN
  Administered 2021-09-22: 400 mg via INTRAVENOUS

## 2021-09-22 MED ORDER — FAMOTIDINE 20 MG PO TABS: 40.0000 mg | ORAL_TABLET | Freq: Once | ORAL | Status: DC | PRN

## 2021-09-22 MED ORDER — CIPROFLOXACIN IN D5W 400 MG/200ML IV SOLN
400.0000 mg | INTRAVENOUS | Status: DC
  Filled 2021-09-22: qty 200

## 2021-09-22 MED ORDER — HYDROMORPHONE 1 MG/ML IV SOLN: INTRAVENOUS | Status: DC

## 2021-09-22 MED ORDER — DIPHENHYDRAMINE HCL 50 MG/ML IJ SOLN: 50.0000 mg | Freq: Once | INTRAMUSCULAR | Status: DC | PRN

## 2021-09-22 MED ORDER — MIDAZOLAM HCL 2 MG/ML PO SYRP: 8.0000 mg | ORAL_SOLUTION | Freq: Once | ORAL | Status: DC | PRN

## 2021-09-22 MED ORDER — MIDAZOLAM HCL 5 MG/5ML IJ SOLN
INTRAMUSCULAR | Status: AC
  Filled 2021-09-22: qty 5

## 2021-09-22 MED ORDER — NITROGLYCERIN 1 MG/10 ML FOR IR/CATH LAB
INTRA_ARTERIAL | Status: DC | PRN
  Administered 2021-09-22: 300 ug via INTRA_ARTERIAL

## 2021-09-22 MED ORDER — SODIUM CHLORIDE 0.9 % IV SOLN: INTRAVENOUS | Status: DC

## 2021-09-22 MED ORDER — TIROFIBAN (AGGRASTAT) BOLUS VIA INFUSION
25.0000 ug/kg | Freq: Once | INTRAVENOUS | Status: AC
Start: 2021-09-22 — End: 2021-09-22
  Filled 2021-09-22: qty 32

## 2021-09-22 MED ORDER — METHYLPREDNISOLONE SODIUM SUCC 125 MG IJ SOLR: 125.0000 mg | Freq: Once | INTRAMUSCULAR | Status: DC | PRN

## 2021-09-22 MED ORDER — ALTEPLASE 2 MG IJ SOLR
INTRAMUSCULAR | Status: AC
  Filled 2021-09-22: qty 4

## 2021-09-22 MED ORDER — HYDROMORPHONE HCL 1 MG/ML IJ SOLN
0.5000 mg | INTRAMUSCULAR | Status: DC | PRN
  Administered 2021-09-22 – 2021-09-28 (×15): 1 mg via INTRAVENOUS
  Filled 2021-09-22 (×16): qty 1

## 2021-09-22 MED ORDER — DEXMEDETOMIDINE HCL IN NACL 400 MCG/100ML IV SOLN
0.3000 ug/kg/h | INTRAVENOUS | Status: DC
Start: 2021-09-22 — End: 2021-09-22

## 2021-09-22 MED ORDER — HEPARIN SODIUM (PORCINE) 1000 UNIT/ML IJ SOLN
INTRAMUSCULAR | Status: AC
  Filled 2021-09-22: qty 10

## 2021-09-22 MED ORDER — TIROFIBAN HCL IN NACL 5-0.9 MG/100ML-% IV SOLN
0.1500 ug/kg/min | INTRAVENOUS | Status: DC
Start: 2021-09-22 — End: 2021-09-23
  Administered 2021-09-22 – 2021-09-23 (×2): 0.15 ug/kg/min via INTRAVENOUS
  Filled 2021-09-22 (×3): qty 100

## 2021-09-22 MED ORDER — FENTANYL CITRATE (PF) 100 MCG/2ML IJ SOLN
INTRAMUSCULAR | Status: DC | PRN
Start: 2021-09-22 — End: 2021-09-22
  Administered 2021-09-22: 25 ug via INTRAVENOUS

## 2021-09-22 MED ORDER — ONDANSETRON HCL 4 MG/2ML IJ SOLN
4.0000 mg | Freq: Four times a day (QID) | INTRAMUSCULAR | Status: DC | PRN
Start: 2021-09-22 — End: 2021-09-28
  Administered 2021-09-25: 4 mg via INTRAVENOUS
  Filled 2021-09-22: qty 2

## 2021-09-22 MED ORDER — OXYCODONE-ACETAMINOPHEN 5-325 MG PO TABS
1.0000 | ORAL_TABLET | Freq: Four times a day (QID) | ORAL | Status: DC | PRN
  Administered 2021-09-22 – 2021-09-24 (×2): 2 via ORAL
  Filled 2021-09-22 (×3): qty 2

## 2021-09-22 MED ORDER — FENTANYL CITRATE (PF) 100 MCG/2ML IJ SOLN
INTRAMUSCULAR | Status: AC
  Filled 2021-09-22: qty 2

## 2021-09-22 MED ORDER — ALTEPLASE 1 MG/ML SYRINGE FOR VASCULAR PROCEDURE
INTRAMUSCULAR | Status: DC | PRN
  Administered 2021-09-22: 4 mg

## 2021-09-22 MED ORDER — MIDAZOLAM HCL 2 MG/2ML IJ SOLN
INTRAMUSCULAR | Status: DC | PRN
  Administered 2021-09-22: 1 mg via INTRAVENOUS

## 2021-09-22 MED ORDER — HEPARIN SODIUM (PORCINE) 1000 UNIT/ML IJ SOLN
INTRAMUSCULAR | Status: DC | PRN
  Administered 2021-09-22: 3000 [IU] via INTRAVENOUS

## 2021-09-22 MED ORDER — TIROFIBAN HCL IV 12.5 MG/250 ML
INTRAVENOUS | Status: AC
  Administered 2021-09-22: 1562.5 ug via INTRAVENOUS
  Filled 2021-09-22: qty 250

## 2021-09-22 MED ORDER — IODIXANOL 320 MG/ML IV SOLN
INTRAVENOUS | Status: DC | PRN
  Administered 2021-09-22: 30 mL

## 2021-09-22 SURGICAL SUPPLY — 13 items
BALLN LUTONIX 018 5X100X130 (BALLOONS) ×2
BALLN ULTRVRSE 3X100X130C (BALLOONS) ×2
BALLOON LUTONIX 018 5X100X130 (BALLOONS) IMPLANT
BALLOON ULTRVRSE 3X100X130C (BALLOONS) IMPLANT
CANISTER PENUMBRA ENGINE (MISCELLANEOUS) ×1 IMPLANT
CATH INDIGO CAT6 KIT (CATHETERS) ×1 IMPLANT
DEVICE SAFEGUARD 24CM (GAUZE/BANDAGES/DRESSINGS) ×1 IMPLANT
DEVICE STARCLOSE SE CLOSURE (Vascular Products) ×1 IMPLANT
KIT ENCORE 26 ADVANTAGE (KITS) ×1 IMPLANT
PACK ANGIOGRAPHY (CUSTOM PROCEDURE TRAY) ×1 IMPLANT
STENT VIABAHN 6X7.5X120 (Permanent Stent) ×1 IMPLANT
WIRE G V18X300CM (WIRE) ×1 IMPLANT
WIRE GUIDERIGHT .035X150 (WIRE) ×1 IMPLANT

## 2021-09-22 NOTE — Progress Notes (Addendum)
Notified Dr. Tamala Julian of placing PCA dilaudid on hold due to decreased RR 6-7 and increasing co2 30s. Placing on hold for 1 hour per orders from Dr. Tamala Julian. She is  alert and oriented to person/place/time/situation. BP 124/69, HR 100. Sats 96% RA.   See new orders.

## 2021-09-22 NOTE — Progress Notes (Signed)
Patient transported to IR via bed around 0730 am, accompanied by RN and transporter. Hand off to receiving RN in IR completed.

## 2021-09-22 NOTE — Progress Notes (Signed)
PCA Dilaudid medication has been off since placed on hold/See EMAR.She has been drowsy and sleeping. When awakened to assess, rates Pain 0/10. She is sleeping mostly and VS stable.

## 2021-09-22 NOTE — Plan of Care (Signed)

## 2021-09-22 NOTE — Progress Notes (Addendum)
Right femoral site and CVC site oozing after aggrastat bolus administered. Pressure held until stopped, MD notified. VIR to replace PAD. ICU RN at bedside.

## 2021-09-22 NOTE — Progress Notes (Addendum)
Inpatient Diabetes Program Recommendations  AACE/ADA: New Consensus Statement on Inpatient Glycemic Control   Target Ranges:  Prepandial:   less than 140 mg/dL      Peak postprandial:   less than 180 mg/dL (1-2 hours)      Critically ill patients:  140 - 180 mg/dL    Latest Reference Range & Units 09/21/21 13:48 09/21/21 16:54 09/21/21 19:00 09/21/21 20:58 09/22/21 07:18  Glucose-Capillary 70 - 99 mg/dL 825 (H) 053 (H) 976 (H)  Novolog 8 units 256 (H) 196 (H)    Latest Reference Range & Units 12/25/20 16:48 09/21/21 18:11  Hemoglobin A1C 4.8 - 5.6 % 8.7 (H) 8.3 (H)   Review of Glycemic Control  Diabetes history: DM2 Outpatient Diabetes medications: Jardiance 10 mg daly, Januvia 100mg  daily, Victoza daily; Prednisone 5 mg daily Current orders for Inpatient glycemic control: Jardiance 10 mg daily, Novolog 0-15 units TID with meals; Prednisone 5 mg daily  Inpatient Diabetes Program Recommendations:    Insulin: While inpatient, may want to discontinue Jardiance. If so, please consider ordering Levemir 6 units Q24H (based on 62.5 kg x 0.1 units).  NOTE: Per chart, patient had vascular procedure yesterday for PAD of LLE and experienced severe pain post op so admitted to ICU. Patient is currently in Cath Lab. Noted consult for diabetes coordinator due to A1C of 8.3% and consideration of insulin outpatient. Will plan to talk with patient today if appropriate after procedure this morning.  Addendum 09/22/21@13 :30-Spoke with patient at bedside regarding DM. Patient reports that she is taking Jardiance 10 mg daily and Januvia 100 mg daily for DM. Inquired about Victoza and patient states that she has been out of Victoza for a month and she just got a refill of Victoza in the mail a couple days ago but she has not taken since getting refills. Inquired about Prednisone 5 mg QAM and patient reports that she started taking Prednisone about 1 month ago for rheumatoid arthritis.  Patient states that she  checks glucose once a day in the morning and it is usually in low 100's mg/dl and that last B3A was in 7% range. Discussed current A1C of 8.3% indicating an average glucose of 192 mg/dl over the past 2-3 months. Discussed glucose and A1C goals. Discussed importance of checking CBGs and maintaining good CBG control to prevent long-term and short-term complications. Explained how hyperglycemia leads to damage within blood vessels which lead to the common complications seen with uncontrolled diabetes. Stressed to the patient the importance of improving glycemic control to prevent further complications from uncontrolled diabetes especially following vascular procedure. Patient reports that she had a follow up appointment on 09/18/21 with Jeralyn Ruths but she had to cancel due to leg pain. Encouraged patient to call [facility] as soon as she is discharged and get follow up appointment. Also encouraged patient to start taking Victoza daily as prescribed. Inquired about possibility of using insulin outpatient and patient stated she does not want to use insulin outpatient. She will resume Victoza and follow up with PCP regarding DM.  Patient verbalized understanding of information discussed and reports no further questions at this time related to diabetes.  Thanks, Orlando Penner, RN, MSN, CDCES Diabetes Coordinator Inpatient Diabetes Program [phone] (Team Pager from 8am to 5pm)

## 2021-09-22 NOTE — Op Note (Signed)
[facility]  Percutaneous Study/Intervention Procedural Note   Date of Surgery: 09/22/2021  Surgeon(s):DEW,JASON    Assistants:none  Pre-operative Diagnosis: PAD with rest pain left lower extremity, status post overnight thrombolytic therapy  Post-operative diagnosis:  Same  Procedure(s) Performed:             1.  Left lower extremity angiogram              2.  Stent placement to left popliteal artery with 6 mm diameter by 7.5 cm length Viabahn stent             3.  Angioplasty of the left anterior tibial artery with 3 mm diameter by 10 cm length balloon             4.  Mechanical thrombectomy of the left anterior tibial artery with the penumbra CAT 6 device             5.  StarClose closure device right femoral artery  EBL: 50 cc  Contrast: 30 cc  Fluoro Time: 3.7 minutes  Moderate Conscious Sedation Time: approximately 54 minutes using 1 mg of Versed and 25 mcg of Fentanyl              Indications:  Patient is a 66 y.o.female with limb threatening ischemia of the left lower extremity with overnight thrombolytic therapy. The patient is brought in for angiography for further evaluation and potential treatment.  Due to the limb threatening nature of the situation, angiogram was performed for attempted limb salvage. The patient is aware that if the procedure fails, amputation would be expected.  The patient also understands that even with successful revascularization, amputation may still be required due to the severity of the situation.  Risks and benefits are discussed and informed consent is obtained.   Procedure:  The patient was identified and appropriate procedural time out was performed.  The patient was then placed supine on the table and prepped and draped in the usual sterile fashion. Moderate conscious sedation was administered during a face to face encounter with the patient throughout the procedure with my supervision of the RN administering medicines  and monitoring the patient's vital signs, pulse oximetry, telemetry and mental status throughout from the start of the procedure until the patient was taken to the recovery room.  The existing thrombolytic catheter was removed and the 18 wire was parked in the distal anterior tibial artery.  Imaging was performed through the sheath and selective left lower extremity angiogram was then performed. The left common femoral artery, profunda femoris artery, and SFA including the previously placed stents were now patent without significant stenosis.  At the distal edge of the previously placed stent in the popliteal artery and several centimeters below the stent was greater than 75% stenosis with residual thrombus over about a 4 to 5 cm span.  The mid popliteal artery normalized and the only continuous runoff was the anterior tibial artery that had a greater than 60% stenosis in the proximal segment that was unclear if this was thrombus or stenosis. It was felt that it was in the patient's best interest to proceed with intervention after these images to avoid a second procedure and a larger amount of contrast and fluoroscopy based off of the findings from the initial angiogram. The patient was given 3000 units of heparin.  I felt primarily stenting the left popliteal artery would be the safest course of action to address both the stenosis and thrombus.  I covered stent in the form of a 6 mm diameter by 7.5 cm length Viabahn stent was deployed in the left popliteal artery and postdilated with a 5 mm balloon with excellent angiographic completion result and less than 10% residual stenosis.  The left anterior tibial artery was then addressed with a 3 mm diameter by 10 cm length angioplasty balloon inflated to 8 atm for 1 minute but following this there remained what appeared to be thrombus and stenosis in the left anterior tibial artery in the proximal and now somewhat the mid segment.  I then used the penumbra CAT 6 device  and perform mechanical thrombectomy after instilling 4 mg of tPA in the left anterior tibial artery.  2 passes were made with the penumbra CAT 6 device and completion imaging now showed inline flow with less than 20% residual stenosis and no obvious residual thrombus into the foot.  I elected to terminate the procedure. The sheath was removed and StarClose closure device was deployed in the right femoral artery with excellent hemostatic result. The patient was taken to the recovery room in stable condition having tolerated the procedure well.  Findings:                      Left Lower Extremity: The left common femoral artery, profunda femoris artery, and SFA including the previously placed stents were now patent without significant stenosis.  At the distal edge of the previously placed stent in the popliteal artery and several centimeters below the stent was greater than 75% stenosis with residual thrombus over about a 4 to 5 cm span.  The mid popliteal artery normalized and the only continuous runoff was the anterior tibial artery that had a greater than 60% stenosis in the proximal segment that was unclear if this was thrombus or stenosis.   Disposition: Patient was taken to the recovery room in stable condition having tolerated the procedure well.  Complications: None  Leotis Pain 09/22/2021 8:53 AM   This note was created with Dragon Medical transcription system. Any errors in dictation are purely unintentional.

## 2021-09-22 NOTE — Progress Notes (Signed)
   NAME:  Sylvia [name], MRN:  [id number], DOB:  [birth year], LOS: 1 ADMISSION DATE:  09/21/2021, CONSULTATION DATE:  6/5 REFERRING MD:  Schnier, CHIEF COMPLAINT:  leg pain   History of Present Illness:  66 y/o female underwent angioplasty and thrombolysis of the left SFA on 6/5, PCCM consulted for assistance with pain control.    Pertinent  Medical History  DM2- last A1c 8.7% Sept 2022 COPD no PFTs on file PVD GERD Anxiety RA on MTX/plaquenil, pred chronically as well as TNF blocker followed by [facility] Events: Including procedures, antibiotic start and stop dates in addition to other pertinent events   09/22/11 angiogram w/ angioplasty, TPA administration, PCCM consult 6/6 back to OR> angioplasty/PCI popliteal artery  Interim History / Subjective:  6/6 back to OR> angioplasty/PCI popliteal artery Pulses present in left DP/PT and popliteal, has some pain in left leg Precedex never used Was drowsy this morning, dilaudid PCA held  Objective   Blood pressure 113/69, pulse 94, temperature 97.9 F (36.6 C), temperature source Oral, resp. rate 14, height 5\' 6"  (1.676 m), weight 62.5 kg, SpO2 97 %.    FiO2 (%):  [0 %] 0 %   Intake/Output Summary (Last 24 hours) at 09/22/2021 0749 Last data filed at 09/22/2021 0700 Gross per 24 hour  Intake 1623.43 ml  Output 1150 ml  Net 473.43 ml   Filed Weights   09/21/21 1655  Weight: 62.5 kg    Examination:  General:  Chronically ill appearing, resting comfortably in bed HENT: NCAT OP clear PULM: CTA B, normal effort CV: RRR, no mgr GI: BS+, soft, nontender MSK: R BKA, left leg warm, pulses present by doppler DP/PT/Popliteal Neuro: awake, alert, no distress, MAEW   Resolved Hospital Problem list     Assessment & Plan:  Post operative pain and pain-related hypertension Uncontrolled DM2 Severe LLE PVD s/p angioplasty, TPA administration POD 1 Baseline opiate/gabapentin use RA on multiple DMARDS COPD,  mild GERD  Anti-platelet therapy per vascular surgery CBC repeat later today Monitor pulses  D/c precedex infusion Resume pain control with dilaudid prn Continue gabapentin per home dosing Continue lisinopril, prn labetalol and metoprolol Cymbalta SSI, Jardiance  Best Practice (right click and "Reselect all SmartList Selections" daily)   Diet/type: Regular consistency (see orders) DVT prophylaxis: other aggrastat 6/6 per vasc protocol GI prophylaxis: N/A Lines: Central line and yes and it is still needed Foley:  Yes, and it is still needed Code Status:  full code Last date of multidisciplinary goals of care discussion [per TRH]  Critical care time: 32 minutes     Roselie Awkward, MD [facility] Pager: [phone] Cell: [phone] After 7:00 pm call Elink  [phone]

## 2021-09-23 DIAGNOSIS — I998 Other disorder of circulatory system: Secondary | ICD-10-CM | POA: Diagnosis not present

## 2021-09-23 LAB — BASIC METABOLIC PANEL
Anion gap: 4 — ABNORMAL LOW (ref 5–15)
BUN: 34 mg/dL — ABNORMAL HIGH (ref 8–23)
CO2: 20 mmol/L — ABNORMAL LOW (ref 22–32)
Calcium: 8.6 mg/dL — ABNORMAL LOW (ref 8.9–10.3)
Chloride: 115 mmol/L — ABNORMAL HIGH (ref 98–111)
Creatinine, Ser: 1.2 mg/dL — ABNORMAL HIGH (ref 0.44–1.00)
GFR, Estimated: 50 mL/min — ABNORMAL LOW (ref >60)
Glucose, Bld: 184 mg/dL — ABNORMAL HIGH (ref 70–99)
Potassium: 4.4 mmol/L (ref 3.5–5.1)
Sodium: 139 mmol/L (ref 135–145)

## 2021-09-23 LAB — CBC
HCT: 21.8 % — ABNORMAL LOW (ref 36.0–46.0)
Hemoglobin: 7.2 g/dL — ABNORMAL LOW (ref 12.0–15.0)
MCH: 29.6 pg (ref 26.0–34.0)
MCHC: 33 g/dL (ref 30.0–36.0)
MCV: 89.7 fL (ref 80.0–100.0)
Platelets: 151 10*3/uL (ref 150–400)
RBC: 2.43 MIL/uL — ABNORMAL LOW (ref 3.87–5.11)
RDW: 13.5 % (ref 11.5–15.5)
WBC: 14.2 10*3/uL — ABNORMAL HIGH (ref 4.0–10.5)
nRBC: 0 % (ref 0.0–0.2)

## 2021-09-23 LAB — GLUCOSE, CAPILLARY
Glucose-Capillary: 174 mg/dL — ABNORMAL HIGH (ref 70–99)
Glucose-Capillary: 199 mg/dL — ABNORMAL HIGH (ref 70–99)
Glucose-Capillary: 199 mg/dL — ABNORMAL HIGH (ref 70–99)
Glucose-Capillary: 189 mg/dL — ABNORMAL HIGH (ref 70–99)
Glucose-Capillary: 189 mg/dL — ABNORMAL HIGH (ref 70–99)

## 2021-09-23 MED ORDER — INSULIN DETEMIR 100 UNIT/ML ~~LOC~~ SOLN
6.0000 [IU] | Freq: Every day | SUBCUTANEOUS | Status: DC
  Administered 2021-09-23 – 2021-09-27 (×5): 6 [IU] via SUBCUTANEOUS
  Filled 2021-09-23 (×6): qty 0.06

## 2021-09-23 MED ORDER — APIXABAN 5 MG PO TABS
5.0000 mg | ORAL_TABLET | Freq: Two times a day (BID) | ORAL | Status: DC
  Administered 2021-09-24: 5 mg via ORAL
  Filled 2021-09-23: qty 1

## 2021-09-23 MED ORDER — ASPIRIN 81 MG PO CHEW
81.0000 mg | CHEWABLE_TABLET | Freq: Every day | ORAL | Status: DC
  Administered 2021-09-24 – 2021-09-28 (×5): 81 mg via ORAL
  Filled 2021-09-23 (×5): qty 1

## 2021-09-23 NOTE — Progress Notes (Signed)
[facility]  Daily Progress Note   Subjective  -   Patient notes that she had some oozing overnight from her CVC site.  She notes she still continues to be sore in the left lower extremity.  Objective Vitals:   09/23/21 1400 09/23/21 1500 09/23/21 1628 09/23/21 2034  BP: (!) 111/57 (!) 107/53 (!) 97/55 (!) 85/60  Pulse: 100 97 97 99  Resp: 14 15 18 16   Temp:   98.3 F (36.8 C) 98.1 F (36.7 C)  TempSrc:   Oral   SpO2: 100% 95% 93% 95%  Weight:      Height:        Intake/Output Summary (Last 24 hours) at 09/23/2021 2345 Last data filed at 09/23/2021 1859 Gross per 24 hour  Intake 2629.03 ml  Output 1900 ml  Net 729.03 ml    PULM  CTAB CV  RRR VASC  dopplerable DP/PT pulses of the left  Laboratory CBC    Component Value Date/Time   WBC 14.2 (H) 09/23/2021 0731   HGB 7.2 (L) 09/23/2021 0731   HGB 8.1 (L) 05/09/2019 1539   HCT 21.8 (L) 09/23/2021 0731   HCT 24.6 (L) 05/09/2019 1539   PLT 151 09/23/2021 0731   PLT 470 (H) 05/09/2019 1539    BMET    Component Value Date/Time   NA 139 09/23/2021 0731   K 4.4 09/23/2021 0731   CL 115 (H) 09/23/2021 0731   CO2 20 (L) 09/23/2021 0731   GLUCOSE 184 (H) 09/23/2021 0731   BUN 34 (H) 09/23/2021 0731   CREATININE 1.20 (H) 09/23/2021 0731   CALCIUM 8.6 (L) 09/23/2021 0731   GFRNONAA 50 (L) 09/23/2021 0731   GFRAA >60 11/23/2019 1505    Assessment/Planning: POD #1 s/p left lower extremity thrombectomy  Patient had some oozing from CVC site overnight with Aggrastat.  She is also still sore.  We will plan on having the patient remain overnight and we will plan on discharge tomorrow.  We will initiate Eliquis.   Kris Hartmann  09/23/2021, 11:45 PM

## 2021-09-23 NOTE — TOC Initial Note (Signed)
Transition of Care [facility]) - Initial/Assessment Note    Patient Details  Name: Sylvia [name] MRN: [id number] Date of Birth: [birth year]  Transition of Care [facility]) CM/SW Contact:    Shelbie Hutching, RN Phone Number: 09/23/2021, 11:27 AM  Clinical Narrative:                  Transition of Care [facility]) Screening Note   Patient Details  Name: Sylvia [name] Date of Birth: [birth year]   Transition of Care [facility]) CM/SW Contact:    Shelbie Hutching, RN Phone Number: 09/23/2021, 11:27 AM    Transition of Care [facility]) has reviewed patient and no TOC needs have been identified at this time. We will continue to monitor patient advancement through interdisciplinary progression rounds. If new patient transition needs arise, please place a TOC consult.          Patient Goals and CMS Choice        Expected Discharge Plan and Services                                                Prior Living Arrangements/Services                       Activities of Daily Living Home Assistive Devices/Equipment: None ADL Screening (condition at time of admission) Patient's cognitive ability adequate to safely complete daily activities?: Yes Is the patient deaf or have difficulty hearing?: No Does the patient have difficulty seeing, even when wearing glasses/contacts?: No Does the patient have difficulty concentrating, remembering, or making decisions?: No Patient able to express need for assistance with ADLs?: Yes Does the patient have difficulty dressing or bathing?: No Independently performs ADLs?: Yes (appropriate for developmental age) Does the patient have difficulty walking or climbing stairs?: No Weakness of Legs: None Weakness of Arms/Hands: None  Permission Sought/Granted                  Emotional Assessment              Admission diagnosis:  Ischemic leg [I99.8] Patient Active Problem List   Diagnosis Date Noted   Ischemic leg  12/25/2020   Rheumatoid arthritis of multiple sites with negative rheumatoid factor ([place]) 08/13/2020   Diabetes mellitus without complication (HCC)    GERD (gastroesophageal reflux disease)    COPD (chronic obstructive pulmonary disease) ([place])    GIB (gastrointestinal bleeding)    HLD (hyperlipidemia)    Depression    Elevated troponin    Syncope    Elevated troponin I level    Anemia of chronic disease 05/16/2019   Dizziness    Polyp of colon    Acute upper GI bleeding 04/13/2019   Anemia associated with acute blood loss 04/13/2019   Chronic anticoagulation 04/13/2019   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/13/2019   Symptomatic anemia 04/13/2019   Upper GI bleed 04/13/2019   Acute GI bleeding 04/13/2019   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), [place] - [place]) [place] [address] Phone: [phone] Fax: [place] [facility] ([facility] ) - [place], [address] [place] [address] Phone: [phone] Fax: [phone]     Social Determinants of Health (SDOH) Interventions    Readmission Risk  Interventions    12/30/2018    5:21 PM  Readmission Risk Prevention Plan  Transportation Screening Complete  PCP or Specialist Appt within 3-5 Days Complete  HRI or [place] Complete  Social Work Consult for [place] Planning/Counseling Complete  Palliative Care Screening Complete  Medication Review [employer]) Complete

## 2021-09-23 NOTE — Progress Notes (Signed)
   NAME:  Sylvia [name], MRN:  [id number], DOB:  July 28, 1955, LOS: 2 ADMISSION DATE:  09/21/2021, CONSULTATION DATE:  6/5 REFERRING MD:  Schnier, CHIEF COMPLAINT:  leg pain   History of Present Illness:  66 y/o female underwent angioplasty and thrombolysis of the left SFA on 6/5, PCCM consulted for assistance with pain control.    Pertinent  Medical History  DM2- last A1c 8.7% Sept 2022 COPD no PFTs on file PVD GERD Anxiety RA on MTX/plaquenil, pred chronically as well as TNF blocker followed by [facility] Events: Including procedures, antibiotic start and stop dates in addition to other pertinent events   09/22/11 angiogram w/ angioplasty, TPA administration, PCCM consult 6/6 back to OR> angioplasty/PCI popliteal artery 6/7 eating, leg is warm  Interim History / Subjective:   Eating Some soreness left leg No acute events  Objective   Blood pressure (!) 106/54, pulse 98, temperature 98 F (36.7 C), temperature source Oral, resp. rate 12, height 5\' 6"  (1.676 m), weight 62.5 kg, SpO2 99 %.        Intake/Output Summary (Last 24 hours) at 09/23/2021 0736 Last data filed at 09/23/2021 0600 Gross per 24 hour  Intake 2997.57 ml  Output 1850 ml  Net 1147.57 ml   Filed Weights   09/21/21 1655  Weight: 62.5 kg    Examination:  General:  Chronically ill appearing, resting comfortably in bed HENT: NCAT OP clear PULM: CTA B, normal effort CV: RRR, no mgr GI: BS+, soft, nontender MSK: s/p R BKA,  Derm: left leg warm to touch, well perfused Neuro: awake, alert, no distress, MAEW    Resolved Hospital Problem list     Assessment & Plan:  Post operative pain and pain-related hypertension Uncontrolled DM2 Severe LLE PVD s/p angioplasty, TPA administration POD 1 Baseline opiate/gabapentin use RA on multiple DMARDS COPD, mild GERD  Antiplatelet therapy per vascular surgery F/U BMET result Continue current pain control regimen Gabapentin per home  dose Continue lisinopril, labetalol, metoprolol as ordered Cymbalta SSI, will d/c jardiance and add levemir 6 U qHS per diabetes coordinator recommendations, but at discharge would resume Jardiance Needs to f/u with PCP after discharge Prednisone 5mg  daily to continue  PCCM will sign off.  Consider TRH consult if ongoing medical management needed  Best Practice (right click and "Reselect all SmartList Selections" daily)   Diet/type: Regular consistency (see orders) DVT prophylaxis: other aggrastat 6/6 per vasc protocol GI prophylaxis: N/A Lines: Central line and yes and it is still needed Foley:  Yes, and it is still needed Code Status:  full code Last date of multidisciplinary goals of care discussion [per TRH]  Critical care time: n/a minutes     Heber [place], MD [place] [facility] Pager: [phone] Cell: [phone] After 7:00 pm call Elink  [phone]

## 2021-09-24 ENCOUNTER — Other Ambulatory Visit (INDEPENDENT_AMBULATORY_CARE_PROVIDER_SITE_OTHER): Payer: Self-pay | Admitting: Nurse Practitioner

## 2021-09-24 DIAGNOSIS — I70229 Atherosclerosis of native arteries of extremities with rest pain, unspecified extremity: Secondary | ICD-10-CM

## 2021-09-24 LAB — GLUCOSE, CAPILLARY
Glucose-Capillary: 120 mg/dL — ABNORMAL HIGH (ref 70–99)
Glucose-Capillary: 167 mg/dL — ABNORMAL HIGH (ref 70–99)
Glucose-Capillary: 109 mg/dL — ABNORMAL HIGH (ref 70–99)
Glucose-Capillary: 114 mg/dL — ABNORMAL HIGH (ref 70–99)

## 2021-09-24 LAB — PREPARE RBC (CROSSMATCH)

## 2021-09-24 LAB — CREATININE, SERUM
Creatinine, Ser: 0.96 mg/dL (ref 0.44–1.00)
GFR, Estimated: >60 mL/min (ref >60)

## 2021-09-24 LAB — HEMOGLOBIN AND HEMATOCRIT, BLOOD
HCT: 17.5 % — ABNORMAL LOW (ref 36.0–46.0)
Hemoglobin: 5.7 g/dL — ABNORMAL LOW (ref 12.0–15.0)

## 2021-09-24 MED ORDER — DIPHENHYDRAMINE HCL 50 MG/ML IJ SOLN
25.0000 mg | Freq: Once | INTRAMUSCULAR | Status: AC
  Administered 2021-09-24: 25 mg via INTRAVENOUS
  Filled 2021-09-24: qty 1

## 2021-09-24 MED ORDER — ACETAMINOPHEN 325 MG PO TABS
650.0000 mg | ORAL_TABLET | Freq: Once | ORAL | Status: AC
  Administered 2021-09-24: 650 mg via ORAL
  Filled 2021-09-24: qty 2

## 2021-09-24 MED ORDER — FUROSEMIDE 10 MG/ML IJ SOLN
20.0000 mg | Freq: Once | INTRAMUSCULAR | Status: AC
  Administered 2021-09-24: 20 mg via INTRAVENOUS
  Filled 2021-09-24: qty 2

## 2021-09-24 MED ORDER — SODIUM CHLORIDE 0.9% IV SOLUTION: Freq: Once | INTRAVENOUS | Status: AC

## 2021-09-24 NOTE — Progress Notes (Signed)
Confirmed with MD Festus BarrenJason Dew, wait to discontinue Foley after Blood transfusions and Lasix.

## 2021-09-24 NOTE — Progress Notes (Signed)
   09/24/21 0304  Assess: MEWS Score  Temp 98.3 F (36.8 C)  BP 109/63  MAP (mmHg) 77  Pulse Rate (!) 118  Resp 14  SpO2 93 %  O2 Device Room Air  Assess: MEWS Score  MEWS Temp 0  MEWS Systolic 0  MEWS Pulse 2  MEWS RR 0  MEWS LOC 0  MEWS Score 2  MEWS Score Color Yellow  Escalate  MEWS: Escalate Yellow: discuss with charge nurse/RN and consider discussing with provider and RRT  Notify: Charge Nurse/RN  Name of Charge Nurse/RN Notified Annice Pih  Date Charge Nurse/RN Notified 09/24/21  Time Charge Nurse/RN Notified 0310  Notify: Provider  Provider Name/Title Graylin Shiver MD  Date Provider Notified 09/24/21  Time Provider Notified 0400  Method of Notification  (TEXT)  Notification Reason Change in status  Provider response No new orders  Date of Provider Response 09/24/21  Time of Provider Response 0636  Assess: SIRS CRITERIA  SIRS Temperature  0  SIRS Pulse 1  SIRS Respirations  0  SIRS WBC 0  SIRS Score Sum  1

## 2021-09-24 NOTE — Progress Notes (Signed)
Sylvia [name]  Daily Progress Note   Subjective  -  Hgb has dropped to 5.7.  black stools. Left leg swollen, stable. Still with [name].HR elevated.  Getting blood now  Objective Vitals:   09/24/21 0429 09/24/21 0746 09/24/21 1156 09/24/21 1733  BP: (!) 128/59 (!) 114/95 126/62 (!) 116/53  Pulse: (!) 118 (!) 118 (!) 109 (!) 110  Resp: 20 18 18 19   Temp: 98.3 F (36.8 C) 98.5 F (36.9 C)  98.6 F (37 C)  TempSrc:    Oral  SpO2: 100% 97% 96% 100%  Weight:      Height:        Intake/Output Summary (Last 24 hours) at 09/24/2021 1754 Last data filed at 09/24/2021 1000 Gross per 24 hour  Intake 480 ml  Output 1850 ml  Net -1370 ml    PULM  CTAB CV  RRR VASC  Left foot warm, palpable pulses.  1-2+ LLE edema.  Laboratory CBC    Component Value Date/Time   WBC 14.2 (H) 09/23/2021 0731   HGB 5.7 (L) 09/24/2021 1452   HGB 8.1 (L) 05/09/2019 1539   HCT 17.5 (L) 09/24/2021 1452   HCT 24.6 (L) 05/09/2019 1539   PLT 151 09/23/2021 0731   PLT 470 (H) 05/09/2019 1539    BMET    Component Value Date/Time   NA 139 09/23/2021 0731   K 4.4 09/23/2021 0731   CL 115 (H) 09/23/2021 0731   CO2 20 (L) 09/23/2021 0731   GLUCOSE 184 (H) 09/23/2021 0731   BUN 34 (H) 09/23/2021 0731   CREATININE 0.96 09/24/2021 1452   CALCIUM 8.6 (L) 09/23/2021 0731   GFRNONAA >60 09/24/2021 1452   GFRAA >60 11/23/2019 1505    Assessment/Planning: POD #2 s/p left leg revascularization  Now with drop in Hgb and likely GI bleed Hold anticoagulation which may threaten her recent intervention and she understands that Plan nuc med study when available for bleeding.  Likely the am Had a slight bump in Cr earlier on admission with two dye loads so would avoid CTA   Sylvia [name]  09/24/2021, 5:54 PM

## 2021-09-24 NOTE — Progress Notes (Signed)
   09/24/21 0746  Assess: MEWS Score  Temp 98.5 F (36.9 C)  BP (!) 114/95  MAP (mmHg) 102  Pulse Rate (!) 118  Resp 18  SpO2 97 %  O2 Device Room Air  Assess: MEWS Score  MEWS Temp 0  MEWS Systolic 0  MEWS Pulse 2  MEWS RR 0  MEWS LOC 0  MEWS Score 2  MEWS Score Color Yellow  Assess: if the MEWS score is Yellow or Red  Were vital signs taken at a resting state? Yes  Focused Assessment No change from prior assessment  Does the patient meet 2 or more of the SIRS criteria? No  MEWS guidelines implemented *See Row Information* No, previously yellow, continue vital signs every 4 hours  Treat  MEWS Interventions Administered prn meds/treatments  Pain Scale 0-10  Pain Score 8  Pain Type Surgical pain;Acute pain  Pain Location Leg  Pain Orientation Left  Pain Descriptors / Indicators Sharp  Pain Frequency Intermittent  Pain Onset On-going  Patients Stated Pain Goal 2  Pain Intervention(s) Medication (See eMAR)  Breathing 0  Negative Vocalization 0  Body Language 1  Consolability 0  Patients response to intervention Effective  Take Vital Signs  Increase Vital Sign Frequency  Yellow: Q 2hr X 2 then Q 4hr X 2, if remains yellow, continue Q 4hrs (previous Yellow MEws)  Escalate  MEWS: Escalate Yellow: discuss with charge nurse/RN and consider discussing with provider and RRT  Notify: Charge Nurse/RN  Name of Charge Nurse/RN Notified Brandi  Date Charge Nurse/RN Notified 09/24/21  Time Charge Nurse/RN Notified 0800  Assess: SIRS CRITERIA  SIRS Temperature  0  SIRS Pulse 1  SIRS Respirations  0  SIRS WBC 0  SIRS Score Sum  1

## 2021-09-25 ENCOUNTER — Encounter: Payer: Self-pay | Admitting: Vascular Surgery

## 2021-09-25 ENCOUNTER — Inpatient Hospital Stay: Payer: Medicare Other

## 2021-09-25 ENCOUNTER — Inpatient Hospital Stay: Payer: Medicare Other | Admitting: Anesthesiology

## 2021-09-25 ENCOUNTER — Encounter
Admission: RE | Disposition: A | Discharge status: Home Health | Payer: Self-pay | Source: Home / Self Care | Attending: Vascular Surgery

## 2021-09-25 DIAGNOSIS — K269 Duodenal ulcer, unspecified as acute or chronic, without hemorrhage or perforation: Secondary | ICD-10-CM | POA: Diagnosis not present

## 2021-09-25 DIAGNOSIS — K921 Melena: Secondary | ICD-10-CM

## 2021-09-25 HISTORY — PX: ESOPHAGOGASTRODUODENOSCOPY (EGD) WITH PROPOFOL: SHX5813

## 2021-09-25 LAB — TYPE AND SCREEN
ABO/RH(D): O POS
Antibody Screen: NEGATIVE
Unit division: 0
Unit division: 0

## 2021-09-25 LAB — CBC
HCT: 28.3 % — ABNORMAL LOW (ref 36.0–46.0)
Hemoglobin: 9.5 g/dL — ABNORMAL LOW (ref 12.0–15.0)
MCH: 28 pg (ref 26.0–34.0)
MCHC: 33.6 g/dL (ref 30.0–36.0)
MCV: 83.5 fL (ref 80.0–100.0)
Platelets: 228 10*3/uL (ref 150–400)
RBC: 3.39 MIL/uL — ABNORMAL LOW (ref 3.87–5.11)
RDW: 14.9 % (ref 11.5–15.5)
WBC: 17 10*3/uL — ABNORMAL HIGH (ref 4.0–10.5)
nRBC: 0.2 % (ref 0.0–0.2)

## 2021-09-25 LAB — GLUCOSE, CAPILLARY
Glucose-Capillary: 106 mg/dL — ABNORMAL HIGH (ref 70–99)
Glucose-Capillary: 91 mg/dL (ref 70–99)
Glucose-Capillary: 120 mg/dL — ABNORMAL HIGH (ref 70–99)
Glucose-Capillary: 163 mg/dL — ABNORMAL HIGH (ref 70–99)

## 2021-09-25 LAB — BPAM RBC
Blood Product Expiration Date: 202306272359
Blood Product Expiration Date: 202306272359
ISSUE DATE / TIME: 202306081729
ISSUE DATE / TIME: 202306082050
Unit Type and Rh: 5100
Unit Type and Rh: 5100

## 2021-09-25 SURGERY — ESOPHAGOGASTRODUODENOSCOPY (EGD) WITH PROPOFOL
Anesthesia: General

## 2021-09-25 MED ORDER — LIDOCAINE HCL (CARDIAC) PF 100 MG/5ML IV SOSY
PREFILLED_SYRINGE | INTRAVENOUS | Status: DC | PRN
  Administered 2021-09-25: 100 mg via INTRAVENOUS

## 2021-09-25 MED ORDER — TECHNETIUM TC 99M-LABELED RED BLOOD CELLS IV KIT
20.0000 | PACK | Freq: Once | INTRAVENOUS | Status: AC
  Administered 2021-09-25: 20.93 via INTRAVENOUS

## 2021-09-25 MED ORDER — PROPOFOL 10 MG/ML IV BOLUS
INTRAVENOUS | Status: DC | PRN
  Administered 2021-09-25: 20 mg via INTRAVENOUS
  Administered 2021-09-25: 80 mg via INTRAVENOUS

## 2021-09-25 MED ORDER — PROPOFOL 500 MG/50ML IV EMUL
INTRAVENOUS | Status: DC | PRN
  Administered 2021-09-25: 150 ug/kg/min via INTRAVENOUS

## 2021-09-25 NOTE — Anesthesia Procedure Notes (Signed)
Date/Time: 09/25/2021 3:06 PM  Performed by: Joanette Gula, Guillaume Weninger, CRNAPre-anesthesia Checklist: Patient identified, Emergency Drugs available, Timeout performed, Patient being monitored and Suction available Patient Re-evaluated:Patient Re-evaluated prior to induction Oxygen Delivery Method: Simple face mask Induction Type: IV induction

## 2021-09-25 NOTE — Care Management Important Message (Signed)
Important Message  Patient Details  Name: Sylvia [name] MRN: [id number] Date of Birth: 09/01/1955   Medicare Important Message Given:  Yes     Olegario MessierKathy A Pola Furno 09/25/2021, 2:11 PM

## 2021-09-25 NOTE — Consult Note (Signed)
Midge Minium, MD [facility]  [address], [address] [place], [place] [address] Phone: [phone] Fax : [phone]  Consultation  Referring Provider:     Dr. Gilda Crease Primary Care Physician:  [facility] Primary Gastroenterologist:  [facility]         Reason for Consultation:     GI bleed  Date of Admission:  09/21/2021 Date of Consultation:  09/25/2021         HPI:   Sylvia [name] is a 66 y.o. female who has a history of IDA and a workup that included a colonoscopy with 6 polyps removed, two upper endoscopies with nothing found and two capsule endoscopies with one small AVM found. The patient had a Hb of 11.5 four days ago and then 9.5 3 days ago and then 5.7 yesterday. The patient is s/p left leg revascularization.  The patient reports that she has not had any fever drinks since 11:00 last night.  She denies any abdominal pain but does report that she has been having black stools recently.  The patient had a bleeding scan today.  The results of this have not been reported as of this dictation.  Past Medical History:  Diagnosis Date   Anemia of chronic disease 05/16/2019   Anxiety    h/o   COPD (chronic obstructive pulmonary disease) (HCC)    Diabetes mellitus without complication (HCC)    GERD (gastroesophageal reflux disease)    Hypertension    bp under control-off meds since 2019    Past Surgical History:  Procedure Laterality Date   ABDOMINAL HYSTERECTOMY     AMPUTATION Right 12/27/2018   Procedure: AMPUTATION BELOW KNEE;  Surgeon: Annice Needy, MD;  Location: [facility];  Service: General;  Laterality: Right;   CATARACT EXTRACTION W/PHACO Right 03/27/2020   Procedure: CATARACT EXTRACTION PHACO AND INTRAOCULAR LENS PLACEMENT (IOC) RIGHT DIABETIC 7.54 00:52.5;  Surgeon: Galen Manila, MD;  Location: [facility];  Service: Ophthalmology;  Laterality: Right;   CATARACT EXTRACTION W/PHACO Left 05/20/2020   Procedure: CATARACT EXTRACTION PHACO  AND INTRAOCULAR LENS PLACEMENT (IOC) LEFT DIABETIC 4.95 00:37.6;  Surgeon: Galen Manila, MD;  Location: [facility];  Service: Ophthalmology;  Laterality: Left;  Diabetic - oral meds COVID + 04-24-20   CHOLECYSTECTOMY     COLONOSCOPY WITH PROPOFOL N/A 04/16/2019   Procedure: COLONOSCOPY WITH PROPOFOL;  Surgeon: Pasty Spillers, MD;  Location: [facility];  Service: Endoscopy;  Laterality: N/A;   COLONOSCOPY WITH PROPOFOL N/A 01/16/2020   Procedure: COLONOSCOPY WITH PROPOFOL;  Surgeon: Pasty Spillers, MD;  Location: [facility];  Service: Endoscopy;  Laterality: N/A;   ESOPHAGOGASTRODUODENOSCOPY (EGD) WITH PROPOFOL N/A 06/15/2018   Procedure: ESOPHAGOGASTRODUODENOSCOPY (EGD) WITH PROPOFOL;  Surgeon: Toledo, Boykin Nearing, MD;  Location: [facility];  Service: Gastroenterology;  Laterality: N/A;   ESOPHAGOGASTRODUODENOSCOPY (EGD) WITH PROPOFOL N/A 01/03/2019   Procedure: ESOPHAGOGASTRODUODENOSCOPY (EGD) WITH PROPOFOL;  Surgeon: Midge Minium, MD;  Location: [facility];  Service: Endoscopy;  Laterality: N/A;   GIVENS CAPSULE STUDY  04/16/2019   Procedure: GIVENS CAPSULE STUDY;  Surgeon: Pasty Spillers, MD;  Location: [facility];  Service: Endoscopy;;   GIVENS CAPSULE STUDY N/A 06/25/2019   Procedure: GIVENS CAPSULE STUDY;  Surgeon: Wyline Mood, MD;  Location: [facility];  Service: Gastroenterology;  Laterality: N/A;   LOWER EXTREMITY ANGIOGRAPHY Right 08/21/2018   Procedure: LOWER EXTREMITY ANGIOGRAPHY;  Surgeon: Annice Needy, MD;  Location: [facility];  Service: Cardiovascular;  Laterality: Right;  LOWER EXTREMITY ANGIOGRAPHY Left 08/28/2018   Procedure: LOWER EXTREMITY ANGIOGRAPHY;  Surgeon: Annice Needyew, Jason S, MD;  Location: [facility];  Service: Cardiovascular;  Laterality: Left;   LOWER EXTREMITY ANGIOGRAPHY Right 08/28/2018   Procedure: Lower Extremity Angiography;  Surgeon: Annice Needyew, Jason S, MD;  Location: [facility];  Service:  Cardiovascular;  Laterality: Right;   LOWER EXTREMITY ANGIOGRAPHY Right 12/18/2018   Procedure: Lower Extremity Angiography;  Surgeon: Annice Needyew, Jason S, MD;  Location: [facility];  Service: Cardiovascular;  Laterality: Right;   LOWER EXTREMITY ANGIOGRAPHY Right 12/18/2018   Procedure: Lower Extremity Angiography;  Surgeon: Annice Needyew, Jason S, MD;  Location: [facility];  Service: Cardiovascular;  Laterality: Right;   LOWER EXTREMITY ANGIOGRAPHY Left 12/21/2018   Procedure: Lower Extremity Angiography;  Surgeon: Annice Needyew, Jason S, MD;  Location: [facility];  Service: Cardiovascular;  Laterality: Left;   LOWER EXTREMITY ANGIOGRAPHY Right 12/21/2018   Procedure: Lower Extremity Angiography;  Surgeon: Annice Needyew, Jason S, MD;  Location: [facility];  Service: Cardiovascular;  Laterality: Right;   LOWER EXTREMITY ANGIOGRAPHY Left 12/25/2020   Procedure: LOWER EXTREMITY ANGIOGRAPHY;  Surgeon: Annice Needyew, Jason S, MD;  Location: [facility];  Service: Cardiovascular;  Laterality: Left;   LOWER EXTREMITY ANGIOGRAPHY Left 09/21/2021   Procedure: Lower Extremity Angiography;  Surgeon: Annice Needyew, Jason S, MD;  Location: [facility];  Service: Cardiovascular;  Laterality: Left;   LOWER EXTREMITY INTERVENTION N/A 12/22/2018   Procedure: LOWER EXTREMITY INTERVENTION;  Surgeon: Annice Needyew, Jason S, MD;  Location: [facility];  Service: Cardiovascular;  Laterality: N/A;   LOWER EXTREMITY INTERVENTION Left 12/26/2020   Procedure: LOWER EXTREMITY INTERVENTION;  Surgeon: Annice Needyew, Jason S, MD;  Location: [facility];  Service: Cardiovascular;  Laterality: Left;   LOWER EXTREMITY INTERVENTION N/A 09/22/2021   Procedure: LOWER EXTREMITY INTERVENTION;  Surgeon: Annice Needyew, Jason S, MD;  Location: [facility];  Service: Cardiovascular;  Laterality: N/A;    Prior to Admission medications   Medication Sig Start Date End Date Taking? Authorizing Provider  atorvastatin (LIPITOR) 10 MG tablet Take 10 mg by  mouth daily. 09/04/21  Yes [provider]  buPROPion (ZYBAN) 150 MG 12 hr tablet Take 150 mg by mouth 2 (two) times daily. 08/24/21  Yes [provider]  cetirizine (ZYRTEC) 10 MG tablet Take 10 mg by mouth daily.   Yes [provider]  clopidogrel (PLAVIX) 75 MG tablet Take 1 tablet (75 mg total) by mouth daily. 12/31/20  Yes Stegmayer, Ranae PlumberKimberly A, PA-C  DULoxetine (CYMBALTA) 30 MG capsule Take 30 mg by mouth daily.   Yes [provider]  ELIQUIS 2.5 MG TABS tablet TAKE 1 TABLET BY MOUTH  TWICE DAILY 03/27/21  Yes Georgiana SpinnerBrown, Fallon E, NP  ferrous sulfate 325 (65 FE) MG tablet Take 325 mg by mouth daily.    Yes [provider]  folic acid (FOLVITE) 1 MG tablet Take 1 mg by mouth daily. 08/13/20  Yes [provider]  gabapentin (NEURONTIN) 300 MG capsule TAKE 2 CAPSULES BY MOUTH IN THE  MORNING AND 3 CAPSULES BY MOUTH  IN THE EVENING 04/21/21  Yes Georgiana SpinnerBrown, Fallon E, NP  hydroxychloroquine (PLAQUENIL) 200 MG tablet Take 200 mg by mouth daily. 01/22/21  Yes [provider]  JARDIANCE 10 MG TABS tablet Take 10 mg by mouth daily. 05/08/20  Yes [provider]  lisinopril (ZESTRIL) 10 MG tablet Take 1 tablet (10 mg total) by mouth  daily. 07/26/19  Yes Arnetha Courser, MD  sitaGLIPtin (JANUVIA) 100 MG tablet Take 100 mg by mouth daily.   Yes [provider]  VICTOZA 18 MG/3ML SOPN Inject into the skin. 09/04/21  Yes [provider]  vitamin B-12 (CYANOCOBALAMIN) 500 MCG tablet Take 1 tablet (500 mcg total) by mouth daily. 11/26/19  Yes Rickard Patience, MD  acetaminophen (TYLENOL) 325 MG tablet Take 650 mg by mouth every 6 (six) hours as needed.    [provider]  albuterol (PROVENTIL HFA;VENTOLIN HFA) 108 (90 Base) MCG/ACT inhaler Inhale 2 puffs into the lungs every 6 (six) hours as needed for wheezing or shortness of breath. 08/20/17   Jeanmarie Plant, MD  docusate sodium (COLACE) 100 MG capsule Take 100 mg by mouth 2 (two) times daily.     [provider]  famotidine (PEPCID) 20 MG tablet Take 1 tablet (20 mg total) by mouth 2 (two) times daily. 10/25/19 11/24/19  Pasty Spillers, MD  HYDROcodone-acetaminophen (NORCO/VICODIN) 5-325 MG tablet Take 1 tablet by mouth every 6 (six) hours as needed for moderate pain. Patient not taking: Reported on 09/17/2021 04/27/21 04/27/22  Georgiana Spinner, NP  Melatonin 1 MG TABS Take 1 tablet by mouth at bedtime.    [provider]  methotrexate (RHEUMATREX) 2.5 MG tablet Take 15 mg by mouth once a week. Patient not taking: Reported on 09/17/2021 10/13/20   [provider]  moxifloxacin (VIGAMOX) 0.5 % ophthalmic solution 1 drop 3 (three) times daily. Patient not taking: Reported on 12/25/2020    [provider]  Multiple Vitamins-Minerals (WOMENS MULTIVITAMIN PO) Take 1 tablet by mouth daily.    [provider]  Na Sulfate-K Sulfate-Mg Sulf 17.5-3.13-1.6 GM/177ML SOLN At 5 PM the day before procedure take 1 bottle and 5 hours before procedure take 1 bottle. Patient not taking: Reported on 02/16/2021 01/09/20   [name] B, MD  prednisoLONE acetate (PRED FORTE) 1 % ophthalmic suspension INSTILL 1 DROP 4 TIMES DAILY AS DIRECTED. BEGINNING AFTER SURGERY Patient not taking: Reported on 12/25/2020 05/27/20   [provider]  predniSONE (DELTASONE) 5 MG tablet Take 5 mg by mouth daily. 06/24/21   [provider]  rosuvastatin (CRESTOR) 5 MG tablet Take 1 tablet (5 mg total) by mouth daily at 6 PM. Patient not taking: Reported on 09/17/2021 07/26/19   Arnetha Courser, MD    Family History  Problem Relation Age of Onset   Breast cancer Mother 72     Social History   Tobacco Use   Smoking status: Some Days    Packs/day: 0.25    Years: 45.00    Total pack years: 11.25    Types: Cigarettes   Smokeless tobacco: Never   Tobacco comments:    quit  Vaping Use   Vaping Use: Never used  Substance Use Topics   Alcohol use: No   Drug use: Not  Currently    Comment: last used in April 2021 per patient    Allergies as of 09/18/2021 - Review Complete 09/17/2021  Allergen Reaction Noted   Hydrocodone Rash 09/14/2021   Metformin and related Diarrhea 06/04/2019   Penicillins Hives 11/14/2015   Tramadol Itching 12/14/2018    Review of Systems:    All systems reviewed and negative except where noted in HPI.   Physical Exam:  Vital signs in last 24 hours: Temp:  [98.4 F (36.9 C)-98.9 F (37.2 C)] 98.4 F (36.9 C) (06/09 0802) Pulse Rate:  [98-110] 108 (06/09 1223)  Resp:  [16-19] 16 (06/09 0802) BP: (101-144)/(53-74) 143/72 (06/09 1223) SpO2:  [95 %-100 %] 98 % (06/09 0802) Last BM Date : 09/24/21 General:   Pleasant, cooperative in NAD Head:  Normocephalic and atraumatic. Eyes:   No icterus.   Conjunctiva pink. PERRLA. Ears:  Normal auditory acuity. Extremities:  Without edema, cyanosis or clubbing. Neurologic:  Alert and oriented x3;  grossly normal neurologically. Skin:  Intact without significant lesions or rashes. Cervical Nodes:  No significant cervical adenopathy. Psych:  Alert and cooperative. Normal affect.  LAB RESULTS: Recent Labs    09/23/21 0731 09/24/21 1452 09/25/21 0942  WBC 14.2*  --  17.0*  HGB 7.2* 5.7* 9.5*  HCT 21.8* 17.5* 28.3*  PLT 151  --  228   BMET Recent Labs    09/23/21 0731 09/24/21 1452  NA 139  --   K 4.4  --   CL 115*  --   CO2 20*  --   GLUCOSE 184*  --   BUN 34*  --   CREATININE 1.20* 0.96  CALCIUM 8.6*  --    LFT No results for input(s): "PROT", "ALBUMIN", "AST", "ALT", "ALKPHOS", "BILITOT", "BILIDIR", "IBILI" in the last 72 hours. PT/INR No results for input(s): "LABPROT", "INR" in the last 72 hours.  STUDIES: No results found.    Impression / Plan:   Assessment: Principal Problem:   Ischemic leg   Sylvia [name] is a 66 y.o. y/o female with melena and a drop in hemoglobin.  The patient has a history of iron deficiency anemia with a complete work-up  including 2 upper endoscopies a colonoscopy and 2 capsule endoscopies.  The patient is now having an acute drop in hemoglobin with dark stools she denies any nausea vomiting.  Plan:  Patient will be brought to the endoscopy unit today for a upper endoscopy due to the dark stools.  The patient has been explained the plan and agrees with it.  Thank you for involving me in the care of this patient.      LOS: 4 days   Midge Minium, MD, [facility] 09/25/2021, 1:41 PM,  Pager [phone] 7am-5pm  Check AMION for 5pm -7am coverage and on weekends   Note: This dictation was prepared with Dragon dictation along with smaller phrase technology. Any transcriptional errors that result from this process are unintentional.

## 2021-09-25 NOTE — Anesthesia Preprocedure Evaluation (Addendum)
Anesthesia Evaluation  Patient identified by MRN, date of birth, ID band Patient awake    Reviewed: Allergy & Precautions, NPO status , Patient's Chart, lab work & pertinent test results  History of Anesthesia Complications Negative for: history of anesthetic complications  Airway Mallampati: III   Neck ROM: Full    Dental  (+) Edentulous Lower, Edentulous Upper   Pulmonary COPD, Current Smoker and Patient abstained from smoking.,  COVID+ 04/24/20   Pulmonary exam normal breath sounds clear to auscultation       Cardiovascular hypertension, + Peripheral Vascular Disease (s/p right BKA on Eliquis)  Normal cardiovascular exam Rhythm:Regular Rate:Normal     Neuro/Psych PSYCHIATRIC DISORDERS Anxiety    GI/Hepatic GERD  ,H/o DU   Endo/Other  diabetes, Poorly Controlled, Type 2last A1c 8.7% Sept 2022  Renal/GU negative Renal ROS     Musculoskeletal  (+) Arthritis , Rheumatoid arthritis of multiple sites with negative rheumatoid factor    Abdominal   Peds  Hematology  (+) Blood dyscrasia, anemia ,   Anesthesia Other Findings 09/22/11 angiogram w/ angioplasty, TPA administration 6/6 back to OR> angioplasty/PCI popliteal artery  Pt had a drop in Hgb w/ GI bleed  Reproductive/Obstetrics                            Anesthesia Physical  Anesthesia Plan  ASA: 3  Anesthesia Plan: General   Post-op Pain Management:    Induction: Intravenous  PONV Risk Score and Plan: 1 and Treatment may vary due to age or medical condition and TIVA  Airway Management Planned: Natural Airway  Additional Equipment:   Intra-op Plan:   Post-operative Plan:   Informed Consent: I have reviewed the patients History and Physical, chart, labs and discussed the procedure including the risks, benefits and alternatives for the proposed anesthesia with the patient or authorized representative who has indicated his/her  understanding and acceptance.       Plan Discussed with: CRNA  Anesthesia Plan Comments:        Anesthesia Quick Evaluation

## 2021-09-25 NOTE — Transfer of Care (Signed)
Immediate Anesthesia Transfer of Care Note  Patient: Sylvia [name]  Procedure(s) Performed: ESOPHAGOGASTRODUODENOSCOPY (EGD) WITH PROPOFOL  Patient Location: Endoscopy Unit  Anesthesia Type:General  Level of Consciousness: drowsy  Airway & Oxygen Therapy: Patient Spontanous Breathing  Post-op Assessment: Report given to RN and Post -op Vital signs reviewed and stable  Post vital signs: Reviewed and stable  Last Vitals:  Vitals Value Taken Time  BP 110/63 09/25/21 1510  Temp 36.1 C 09/25/21 1510  Pulse 106 09/25/21 1511  Resp 22 09/25/21 1511  SpO2 98 % 09/25/21 1511  Vitals shown include unvalidated device data.  Last Pain:  Vitals:   09/25/21 1510  TempSrc: Temporal  PainSc: Asleep      Patients Stated Pain Goal: 2 (09/24/21 2132)  Complications: No notable events documented.

## 2021-09-25 NOTE — Op Note (Signed)
[facility] Patient Name: Sylvia [name] Procedure Date: 09/25/2021 2:48 PM MRN: [id number] Account #: [id number] Date of Birth: 1956/03/17 Admit Type: Outpatient Age: 66 Room: [facility] ENDO ROOM 3 Gender: Female Note Status: Finalized Instrument Name: Altamese Cabal Endoscope [id number] Procedure:             Upper GI endoscopy Indications:           Acute post hemorrhagic anemia Providers:             Lucilla Lame MD, MD Medicines:             Propofol per Anesthesia Complications:         No immediate complications. Procedure:             Pre-Anesthesia Assessment:                        - Prior to the procedure, a History and Physical was                         performed, and patient medications and allergies were                         reviewed. The patient's tolerance of previous                         anesthesia was also reviewed. The risks and benefits                         of the procedure and the sedation options and risks                         were discussed with the patient. All questions were                         answered, and informed consent was obtained. Prior                         Anticoagulants: The patient has taken anticoagulant                         medication. ASA Grade Assessment: II - A patient with                         mild systemic disease. After reviewing the risks and                         benefits, the patient was deemed in satisfactory                         condition to undergo the procedure.                        After obtaining informed consent, the endoscope was                         passed under direct vision. Throughout the procedure,                         the patient's blood pressure,  pulse, and oxygen                         saturations were monitored continuously. The Endoscope                         was introduced through the mouth, and advanced to the                         second part of  duodenum. The upper GI endoscopy was                         accomplished without difficulty. The patient tolerated                         the procedure well. Findings:      A small hiatal hernia was present.      Localized moderate inflammation characterized by erythema was found in       the gastric antrum. Biopsies were taken with a cold forceps for       histology.      Two non-bleeding cratered duodenal ulcers with no stigmata of bleeding       were found in the duodenal bulb. Impression:            - Small hiatal hernia.                        - Gastritis. Biopsied.                        - Non-bleeding duodenal ulcers with no stigmata of                         bleeding. Recommendation:        - Return patient to hospital ward for ongoing care.                        - Resume previous diet.                        - Continue present medications.                        - Use a proton pump inhibitor PO daily. Procedure Code(s):     --- Professional ---                        [phone], Esophagogastroduodenoscopy, flexible,                         transoral; with biopsy, single or multiple Diagnosis Code(s):     --- Professional ---                        D62, Acute posthemorrhagic anemia                        K26.9, Duodenal ulcer, unspecified as acute or                         chronic, without hemorrhage or perforation CPT copyright 2019 American Medical Association.  All rights reserved. The codes documented in this report are preliminary and upon coder review may  be revised to meet current compliance requirements. Lucilla Lame MD, MD 09/25/2021 3:10:08 PM This report has been signed electronically. Number of Addenda: 0 Note Initiated On: 09/25/2021 2:48 PM Estimated Blood Loss:  Estimated blood loss: none.      [facility]

## 2021-09-25 NOTE — Progress Notes (Signed)
[facility]  Daily Progress Note   Subjective  -   No bloody stools overnight. No major events.  Still with some left leg swelling and pain, but stable. CBC pending this am  Objective Vitals:   09/24/21 2140 09/25/21 0005 09/25/21 0519 09/25/21 0802  BP: 101/61 118/61 (!) 135/57 137/74  Pulse: (!) 105 98 (!) 106 (!) 102  Resp: 16 16 16 16   Temp: 98.6 F (37 C) 98.6 F (37 C) 98.9 F (37.2 C) 98.4 F (36.9 C)  TempSrc:    Oral  SpO2: 96% 95% 98% 98%  Weight:      Height:        Intake/Output Summary (Last 24 hours) at 09/25/2021 0821 Last data filed at 09/25/2021 0523 Gross per 24 hour  Intake 3193.58 ml  Output 2900 ml  Net 293.58 ml    PULM  CTAB CV  RRR VASC  1+ LLE edema, compartments seem soft. 1+ DP pulse present  Laboratory CBC    Component Value Date/Time   WBC 14.2 (H) 09/23/2021 0731   HGB 5.7 (L) 09/24/2021 1452   HGB 8.1 (L) 05/09/2019 1539   HCT 17.5 (L) 09/24/2021 1452   HCT 24.6 (L) 05/09/2019 1539   PLT 151 09/23/2021 0731   PLT 470 (H) 05/09/2019 1539    BMET    Component Value Date/Time   NA 139 09/23/2021 0731   K 4.4 09/23/2021 0731   CL 115 (H) 09/23/2021 0731   CO2 20 (L) 09/23/2021 0731   GLUCOSE 184 (H) 09/23/2021 0731   BUN 34 (H) 09/23/2021 0731   CREATININE 0.96 09/24/2021 1452   CALCIUM 8.6 (L) 09/23/2021 0731   GFRNONAA >60 09/24/2021 1452   GFRAA >60 11/23/2019 1505    Assessment/Planning: POD #3 s/p LLE revascularization  GI bleed has complicated things and anticoagulation on hold Nuc med study this am Would like to get back anticoagulation when safe as the durability of the intervention will be poor without it Difficult situation   Sylvia [name]  09/25/2021, 8:21 AM

## 2021-09-25 NOTE — Anesthesia Postprocedure Evaluation (Signed)
Anesthesia Post Note  Patient: Sylvia [name]  Procedure(s) Performed: ESOPHAGOGASTRODUODENOSCOPY (EGD) WITH PROPOFOL  Patient location during evaluation: Endoscopy Anesthesia Type: General Level of consciousness: awake and alert Pain management: pain level controlled Vital Signs Assessment: post-procedure vital signs reviewed and stable Respiratory status: spontaneous breathing, nonlabored ventilation and respiratory function stable Cardiovascular status: blood pressure returned to baseline and stable Postop Assessment: no apparent nausea or vomiting Anesthetic complications: no   No notable events documented.   Last Vitals:  Vitals:   09/25/21 1510 09/25/21 1530  BP: 110/63 138/65  Pulse:    Resp:    Temp: (!) 36.1 C   SpO2:      Last Pain:  Vitals:   09/25/21 1530  TempSrc:   PainSc: 0-No pain                 [place] Ouch

## 2021-09-25 NOTE — Care Management (Signed)
Previously noted with no needs.  Patient was off floor today when TOC came to check in

## 2021-09-26 LAB — GLUCOSE, CAPILLARY
Glucose-Capillary: 144 mg/dL — ABNORMAL HIGH (ref 70–99)
Glucose-Capillary: 167 mg/dL — ABNORMAL HIGH (ref 70–99)
Glucose-Capillary: 272 mg/dL — ABNORMAL HIGH (ref 70–99)
Glucose-Capillary: 151 mg/dL — ABNORMAL HIGH (ref 70–99)

## 2021-09-26 MED ORDER — APIXABAN 2.5 MG PO TABS
2.5000 mg | ORAL_TABLET | Freq: Two times a day (BID) | ORAL | Status: DC
  Administered 2021-09-26 – 2021-09-28 (×5): 2.5 mg via ORAL
  Filled 2021-09-26 (×5): qty 1

## 2021-09-26 MED ORDER — CLOPIDOGREL BISULFATE 75 MG PO TABS
75.0000 mg | ORAL_TABLET | Freq: Every day | ORAL | Status: DC
  Administered 2021-09-26 – 2021-09-28 (×3): 75 mg via ORAL
  Filled 2021-09-26 (×3): qty 1

## 2021-09-26 NOTE — Progress Notes (Signed)
1 Day Post-Op   Subjective/Chief Complaint: States her LEFT leg is painful, edema- unchanged. She attempted to stand but was unable to secondary to weakness, pain.   Objective: Vital signs in last 24 hours: Temp:  [97 F (36.1 C)-98.8 F (37.1 C)] 98.4 F (36.9 C) (06/10 1117) Pulse Rate:  [94-110] 94 (06/10 1117) Resp:  [16-20] 20 (06/10 1117) BP: (110-154)/(60-74) 130/69 (06/10 1117) SpO2:  [95 %-100 %] 95 % (06/10 1117) Weight:  [62.5 kg] 62.5 kg (06/09 1441) Last BM Date : 09/24/21  Intake/Output from previous day: 06/09 0701 - 06/10 0700 In: 600 [I.V.:600] Out: 2950 [Urine:2950] Intake/Output this shift: No intake/output data recorded.  General appearance: alert and no distress Resp: clear to auscultation bilaterally Extremities: LEFT leg-warm, calf soft, +DP, mild edema  Lab Results:  Recent Labs    09/24/21 1452 09/25/21 0942  WBC  --  17.0*  HGB 5.7* 9.5*  HCT 17.5* 28.3*  PLT  --  228   BMET Recent Labs    09/24/21 1452  CREATININE 0.96   PT/INR No results for input(s): "LABPROT", "INR" in the last 72 hours. ABG No results for input(s): "PHART", "HCO3" in the last 72 hours.  Invalid input(s): "PCO2", "PO2"  Studies/Results: NM GI Blood Loss  Result Date: 09/25/2021 CLINICAL DATA:  Concern for gastrointestinal bleed. EXAM: NUCLEAR MEDICINE GASTROINTESTINAL BLEEDING SCAN TECHNIQUE: Sequential abdominal images were obtained following intravenous administration of Tc-8365m labeled red blood cells. RADIOPHARMACEUTICALS:  20.73 mCi Tc-2865m pertechnetate in-vitro labeled red cells. COMPARISON:  CT April 24, 2020. FINDINGS: No abnormal areas of radiotracer uptake in the abdomen or pelvis to suggest acute gastrointestinal bleed. IMPRESSION: No scintigraphic evidence of acute gastrointestinal hemorrhage. Electronically Signed   By: Maudry MayhewJeffrey  Waltz M.D.   On: 09/25/2021 14:24    Anti-infectives: Anti-infectives (From admission, onward)    Start     Dose/Rate  Route Frequency Ordered Stop   09/22/21 1000  hydroxychloroquine (PLAQUENIL) tablet 200 mg        200 mg Oral Daily 09/21/21 1343     09/22/21 0800  ciprofloxacin (CIPRO) IVPB  Status:  Discontinued        over 60 Minutes  Continuous PRN 09/22/21 0801 09/22/21 0906   09/22/21 0731  ciprofloxacin (CIPRO) IVPB 400 mg  Status:  Discontinued        400 mg 200 mL/hr over 60 Minutes Intravenous 60 min pre-op 09/22/21 0731 09/22/21 0905   09/21/21 0008  vancomycin (VANCOCIN) IVPB 1000 mg/200 mL premix        1,000 mg 200 mL/hr over 60 Minutes Intravenous 60 min pre-op 09/21/21 0008 09/21/21 1301       Assessment/Plan: s/p Procedure(s): ESOPHAGOGASTRODUODENOSCOPY (EGD) WITH PROPOFOL (N/A)- healing duodenal ulcers POD #4 s/p LLE revascularization D/C foley OOB will need PT Begin Eliquis 2.5 and Plavix Monitor for SSx of bleeding  LOS: 5 days    Eli Hosesco, Noelani Harbach A 09/26/2021

## 2021-09-26 NOTE — Evaluation (Signed)
Physical Therapy Evaluation Patient Details Name: Sylvia SensingRoberta [name] MRN: [id number] DOB: 04/27/1955 Today's Date: 09/26/2021  History of Present Illness  66 y/o female s/p L LE revascualrization, thrombectomy, stent placement, angiogram 6/5-09/2021.  H/o R BKA.  Clinical Impression  Pt showed great effort with PT exam and though she had considerable pain and weakness in the L LE was able to slowly, but safely ambulation ~45 ft into the hallway with her prosthetic and walker.  Pt typically uses a cane but was unable to safely support herself with it earlier.  She did still rely heavily on the walker but was able to steadily improve cadence with consistent cuing and encouragement.  Pt with very poor ankle AROM and swelling t/o the L LE but ultimately did well and showed the ability to manage in-home distances safely, albeit very slow and guarded.      Recommendations for follow up therapy are one component of a multi-disciplinary discharge planning process, led by the attending physician.  Recommendations may be updated based on patient status, additional functional criteria and insurance authorization.  Follow Up Recommendations Home health PT    Assistance Recommended at Discharge Intermittent Supervision/Assistance  Patient can return home with the following  Assistance with cooking/housework;Assist for transportation;A little help with walking and/or transfers    Equipment Recommendations None recommended by PT  Recommendations for Other Services       Functional Status Assessment Patient has had a recent decline in their functional status and demonstrates the ability to make significant improvements in function in a reasonable and predictable amount of time.     Precautions / Restrictions Precautions Precautions: Fall Restrictions Weight Bearing Restrictions: No      Mobility  Bed Mobility Overal bed mobility: Independent                  Transfers Overall transfer  level: Modified independent Equipment used: Rolling walker (2 wheels)               General transfer comment: Pt needing to rely heavily on UEs, strategy to lock L LE and lean back on chair to shift weight toward R    Ambulation/Gait Ambulation/Gait assistance: Supervision Gait Distance (Feet): 45 Feet Assistive device: Rolling walker (2 wheels)         General Gait Details: Pt was highly motivated to do some walking despite having quite a bit of pain and weakness in the L.  She did need to exaggerate knee drive as she had some foot drop issues on the L, highly reliant on the walker/UEs and hesitant to really put full weight through L.  Cues to normalize gait pattern as able as she was maintaining hyper extension during WBing and avoiding R step through due to pain - pt did improve both of these with cuing and increased time ambulating  Stairs            Wheelchair Mobility    Modified Rankin (Stroke Patients Only)       Balance Overall balance assessment: Needs assistance   Sitting balance-Leahy Scale: Normal     Standing balance support: Bilateral upper extremity supported Standing balance-Leahy Scale: Fair Standing balance comment: hesitant with WBing on L, prosthetic on R - no LOBs but clearly reliant on walker                             Pertinent Vitals/Pain Pain Assessment Pain Assessment: 0-10 Pain Score: 8  Pain Location: L leg    Home Living Family/patient expects to be discharged to:: Private residence Living Arrangements: Spouse/significant other Available Help at Discharge: Family;Friend(s);Available 24 hours/day Type of Home: House Home Access: Ramped entrance       Home Layout: One level Home Equipment: Cane - single point;Hand held shower head;Wheelchair - Careers adviser (comment);Rolling Walker (2 wheels) (R BKA prosthetic)      Prior Function Prior Level of Function : Independent/Modified Independent              Mobility Comments: typically able to be community active with cane and prosthetic       Hand Dominance        Extremity/Trunk Assessment   Upper Extremity Assessment Upper Extremity Assessment: Overall WFL for tasks assessed    Lower Extremity Assessment Lower Extremity Assessment:  (L ankle with very little AROM DF/PF, quad grossly 3+/5, pain with all resitsed LE tasks.  R LE functional (s/p BKA))       Communication   Communication: No difficulties  Cognition Arousal/Alertness: Awake/alert Behavior During Therapy: WFL for tasks assessed/performed Overall Cognitive Status: Within Functional Limits for tasks assessed                                          General Comments General comments (skin integrity, edema, etc.): HR tyo 120 bpm with ambulation effort    Exercises     Assessment/Plan    PT Assessment Patient needs continued PT services  PT Problem List Decreased activity tolerance;Decreased balance;Decreased mobility;Decreased strength;Decreased range of motion;Decreased knowledge of use of DME;Decreased safety awareness;Pain;Cardiopulmonary status limiting activity       PT Treatment Interventions DME instruction;Gait training;Functional mobility training;Therapeutic activities;Therapeutic exercise;Balance training;Neuromuscular re-education;Patient/family education    PT Goals (Current goals can be found in the Care Plan section)  Acute Rehab PT Goals Patient Stated Goal: control the pain PT Goal Formulation: With patient Time For Goal Achievement: 10/09/21 Potential to Achieve Goals: Good    Frequency Min 2X/week     Co-evaluation               AM-PAC PT "6 Clicks" Mobility  Outcome Measure Help needed turning from your back to your side while in a flat bed without using bedrails?: None Help needed moving from lying on your back to sitting on the side of a flat bed without using bedrails?: None Help needed moving to and  from a bed to a chair (including a wheelchair)?: A Little Help needed standing up from a chair using your arms (e.g., wheelchair or bedside chair)?: A Little Help needed to walk in hospital room?: A Little Help needed climbing 3-5 steps with a railing? : A Lot 6 Click Score: 19    End of Session Equipment Utilized During Treatment: Gait belt Activity Tolerance: Patient tolerated treatment well;Patient limited by fatigue;Patient limited by pain Patient left: with chair alarm set;with call bell/phone within reach;with family/visitor present Nurse Communication: Mobility status PT Visit Diagnosis: Muscle weakness (generalized) (M62.81);Difficulty in walking, not elsewhere classified (R26.2);Pain Pain - Right/Left: Left Pain - part of body: Leg    Time: 0981-1914 PT Time Calculation (min) (ACUTE ONLY): 34 min   Charges:   PT Evaluation $PT Eval Low Complexity: 1 Low PT Treatments $Gait Training: 8-22 mins        Malachi Pro, DPT 09/26/2021, 5:12 PM

## 2021-09-27 ENCOUNTER — Other Ambulatory Visit: Payer: Self-pay

## 2021-09-27 LAB — CBC
HCT: 29.2 % — ABNORMAL LOW (ref 36.0–46.0)
Hemoglobin: 9.8 g/dL — ABNORMAL LOW (ref 12.0–15.0)
MCH: 28.6 pg (ref 26.0–34.0)
MCHC: 33.6 g/dL (ref 30.0–36.0)
MCV: 85.1 fL (ref 80.0–100.0)
Platelets: 346 10*3/uL (ref 150–400)
RBC: 3.43 MIL/uL — ABNORMAL LOW (ref 3.87–5.11)
RDW: 14.6 % (ref 11.5–15.5)
WBC: 11.7 10*3/uL — ABNORMAL HIGH (ref 4.0–10.5)
nRBC: 0 % (ref 0.0–0.2)

## 2021-09-27 LAB — GLUCOSE, CAPILLARY
Glucose-Capillary: 135 mg/dL — ABNORMAL HIGH (ref 70–99)
Glucose-Capillary: 179 mg/dL — ABNORMAL HIGH (ref 70–99)
Glucose-Capillary: 242 mg/dL — ABNORMAL HIGH (ref 70–99)
Glucose-Capillary: 157 mg/dL — ABNORMAL HIGH (ref 70–99)

## 2021-09-27 LAB — COMPREHENSIVE METABOLIC PANEL
ALT: 66 U/L — ABNORMAL HIGH (ref 0–44)
AST: 116 U/L — ABNORMAL HIGH (ref 15–41)
Albumin: 2.9 g/dL — ABNORMAL LOW (ref 3.5–5.0)
Alkaline Phosphatase: 98 U/L (ref 38–126)
Anion gap: 7 (ref 5–15)
BUN: 16 mg/dL (ref 8–23)
CO2: 25 mmol/L (ref 22–32)
Calcium: 9.4 mg/dL (ref 8.9–10.3)
Chloride: 110 mmol/L (ref 98–111)
Creatinine, Ser: 0.9 mg/dL (ref 0.44–1.00)
GFR, Estimated: >60 mL/min (ref >60)
Glucose, Bld: 162 mg/dL — ABNORMAL HIGH (ref 70–99)
Potassium: 4.2 mmol/L (ref 3.5–5.1)
Sodium: 142 mmol/L (ref 135–145)
Total Bilirubin: 0.6 mg/dL (ref 0.3–1.2)
Total Protein: 6.9 g/dL (ref 6.5–8.1)

## 2021-09-27 MED ORDER — CHLORHEXIDINE GLUCONATE CLOTH 2 % EX PADS
6.0000 | MEDICATED_PAD | Freq: Every day | CUTANEOUS | Status: DC
  Administered 2021-09-27 – 2021-09-28 (×2): 6 via TOPICAL

## 2021-09-27 NOTE — TOC Progression Note (Addendum)
Transition of Care [facility](TOC) - Progression Note    Patient Details  Name: Sylvia [name] MRN: [id number] Date of Birth: 04/21/1955  Transition of Care [facility](TOC) CM/SW Contact  Bing QuarryBarbara B Severina Sykora, RN Phone Number: 09/27/2021, 3:13 PM  Clinical Narrative: 6/11: [facility] re [facility] and [facility] but was okay with which one would accept insurance. [facility]. Patient confirmed agreement with this.  [facility] P[facility] with [name]  Denies any issues obtaining medications or transportation issues getting to appointments.  Uses several pharmacies including [name] [address] [place] and [facility] home delivery. Gabriel CirriBarbie Manson Luckadoo RN CM   UPDATE: 350 pm: [facility] is now pending acceptance till Monday. They will let weekday CM know of decision. Requested order from provider.   Gabriel CirriBarbie Adnan Vanvoorhis RN CM          Expected Discharge Plan and Services                                       HH [facility] Date HH Agency Contacted: 09/27/21 Time HH Agency Contacted: [phone] Representative [facility] Agency: Feliberto GottronJason HInton   Social Determinants of Health (SDOH) Interventions    Readmission Risk Interventions     No data to display

## 2021-09-27 NOTE — Progress Notes (Signed)
2 Days Post-Op   Subjective/Chief Complaint: Doing OK. Still with pain in LEFT leg but improved. Decreased edema. Worked with PT yesterday. No SSx of further bleeding- Eliquis/Plavix started   Objective: Vital signs in last 24 hours: Temp:  [98.1 F (36.7 C)-98.7 F (37.1 C)] 98.3 F (36.8 C) (06/11 0811) Pulse Rate:  [81-105] 81 (06/11 0942) Resp:  [16-20] 16 (06/11 0811) BP: (127-174)/(57-91) 136/78 (06/11 0942) SpO2:  [95 %-100 %] 99 % (06/11 0811) Last BM Date : 09/24/21  Intake/Output from previous day: No intake/output data recorded. Intake/Output this shift: No intake/output data recorded.  General appearance: alert and no distress Resp: clear to auscultation bilaterally Extremities: LEFT leg warm, decreased edema, calf soft, +DP  Lab Results:  Recent Labs    09/25/21 0942 09/27/21 1034  WBC 17.0* 11.7*  HGB 9.5* 9.8*  HCT 28.3* 29.2*  PLT 228 346   BMET Recent Labs    09/24/21 1452  CREATININE 0.96   PT/INR No results for input(s): "LABPROT", "INR" in the last 72 hours. ABG No results for input(s): "PHART", "HCO3" in the last 72 hours.  Invalid input(s): "PCO2", "PO2"  Studies/Results: NM GI Blood Loss  Result Date: 09/25/2021 CLINICAL DATA:  Concern for gastrointestinal bleed. EXAM: NUCLEAR MEDICINE GASTROINTESTINAL BLEEDING SCAN TECHNIQUE: Sequential abdominal images were obtained following intravenous administration of Tc-57m labeled red blood cells. RADIOPHARMACEUTICALS:  20.73 mCi Tc-48m pertechnetate in-vitro labeled red cells. COMPARISON:  CT April 24, 2020. FINDINGS: No abnormal areas of radiotracer uptake in the abdomen or pelvis to suggest acute gastrointestinal bleed. IMPRESSION: No scintigraphic evidence of acute gastrointestinal hemorrhage. Electronically Signed   By: Maudry Mayhew M.D.   On: 09/25/2021 14:24    Anti-infectives: Anti-infectives (From admission, onward)    Start     Dose/Rate Route Frequency Ordered Stop   09/22/21 1000   hydroxychloroquine (PLAQUENIL) tablet 200 mg        200 mg Oral Daily 09/21/21 1343     09/22/21 0800  ciprofloxacin (CIPRO) IVPB  Status:  Discontinued        over 60 Minutes  Continuous PRN 09/22/21 0801 09/22/21 0906   09/22/21 0731  ciprofloxacin (CIPRO) IVPB 400 mg  Status:  Discontinued        400 mg 200 mL/hr over 60 Minutes Intravenous 60 min pre-op 09/22/21 0731 09/22/21 0905   09/21/21 0008  vancomycin (VANCOCIN) IVPB 1000 mg/200 mL premix        1,000 mg 200 mL/hr over 60 Minutes Intravenous 60 min pre-op 09/21/21 0008 09/21/21 1301       Assessment/Plan: s/p Procedure(s): ESOPHAGOGASTRODUODENOSCOPY (EGD) WITH PROPOFOL (N/A) POD #5 s/p LLE revascularization Will need an additional day for PT Home Health for D/C tomorrow Pain control Continue Eliquis/Plavix HgB pending Plan for discharge tomorrow  LOS: 6 days    Eli Hose A 09/27/2021

## 2021-09-28 ENCOUNTER — Encounter: Payer: Self-pay | Admitting: Gastroenterology

## 2021-09-28 LAB — GLUCOSE, CAPILLARY
Glucose-Capillary: 140 mg/dL — ABNORMAL HIGH (ref 70–99)
Glucose-Capillary: 184 mg/dL — ABNORMAL HIGH (ref 70–99)

## 2021-09-28 MED ORDER — FAMOTIDINE 20 MG PO TABS
20.0000 mg | ORAL_TABLET | Freq: Every day | ORAL | 3 refills | Status: DC
Start: 2021-09-28 — End: 2022-02-23

## 2021-09-28 MED ORDER — ASPIRIN 81 MG PO CHEW: 81.0000 mg | CHEWABLE_TABLET | Freq: Every day | ORAL | 11 refills | Status: DC

## 2021-09-28 MED ORDER — PANTOPRAZOLE SODIUM 40 MG PO TBEC: 40.0000 mg | DELAYED_RELEASE_TABLET | Freq: Every day | ORAL | 11 refills | Status: AC

## 2021-09-28 MED ORDER — OXYCODONE-ACETAMINOPHEN 5-325 MG PO TABS: 1.0000 | ORAL_TABLET | Freq: Four times a day (QID) | ORAL | 0 refills | Status: DC | PRN

## 2021-09-28 NOTE — Progress Notes (Signed)
Patient discharged home. All lines removed and assessed. Discharge education and instructions reviewed with patient, all questions answered.

## 2021-09-28 NOTE — Care Management Important Message (Signed)
Important Message  Patient Details  Name: Sylvia SensingRoberta Leanos MRN: [id number] Date of Birth: 02/03/1956   Medicare Important Message Given:  Yes     Olegario MessierKathy A Meshia Rau 09/28/2021, 2:55 PM

## 2021-09-28 NOTE — Discharge Summary (Addendum)
[facility]    Discharge Summary    Patient ID:  Sylvia SensingRoberta Haning MRN: [id number] DOB/AGE: 67/07/1955 67 y.o.  Admit date: 09/21/2021 Discharge date: 09/28/2021 Date of Surgery: 09/21/2021 - 09/25/2021 Surgeon: Moishe SpiceSurgeon(s): Midge MiniumWohl, Darren, MD  Admission Diagnosis: Ischemic leg [I99.8]  Discharge Diagnoses:  Ischemic leg [I99.8]  Secondary Diagnoses: Past Medical History:  Diagnosis Date   Anemia of chronic disease 05/16/2019   Anxiety    h/o   COPD (chronic obstructive pulmonary disease) (HCC)    Diabetes mellitus without complication (HCC)    GERD (gastroesophageal reflux disease)    Hypertension    bp under control-off meds since 2019    Procedure(s): ESOPHAGOGASTRODUODENOSCOPY (EGD) WITH PROPOFOL  Discharged Condition: good  HPI:  Sylvia [name] is a 66 year old female who [facility] for revascularization of the left lower extremity.  The patient subsequently had a reocclusion of her left lower extremity which required overnight thrombolytic therapy.  Following this the patient was able to be revascularized.  Initially the patient complained of pain and swelling in the left lower extremity postintervention.  Subsequently it was noted that the patient had dark tarry stools.  The patient has previously had issues with GI bleeds on high doses of anticoagulation.  Nuclear medicine study showed no evidence of active GI bleed.  The patient also underwent upper endoscopy which showed nonbleeding duodenal ulcers.  The patient ultimately had a drop in hemoglobin down to 5.5 and was replaced with 2 units.  The most recent hemoglobin was 9.8.  The patient notes that the pain in her left lower extremity is improved but still has some difficulty with walking.  The patient's Plavix and aspirin were reinitiated with 2.5 mg of Eliquis.  She has not had any other issues with bleeding since that time.  Hospital Course:   Sylvia SensingRoberta Welle is a 66 y.o. female is S/P Left lower extremity angiogram Procedure(s): ESOPHAGOGASTRODUODENOSCOPY (EGD) WITH PROPOFOL Extubated: POD # 0 Physical exam: Palpable DP pulse, calf soft, left leg warm, minimal edema Post-op wounds clean, dry, intact or healing well Pt. Ambulating but continues to have some pain with doing so. voiding and taking PO diet without difficulty. Pt pain controlled with PO pain meds. Labs as below Complications: Bloody stools, drop in hemoglobin requiring blood transfusion  Consults: Gastroenterology   Significant Diagnostic Studies: CBC Lab Results  Component Value Date   WBC 11.7 (H) 09/27/2021   HGB 9.8 (L) 09/27/2021   HCT 29.2 (L) 09/27/2021   MCV 85.1 09/27/2021   PLT 346 09/27/2021    BMET    Component Value Date/Time   NA 142 09/27/2021 1034   K 4.2 09/27/2021 1034   CL 110 09/27/2021 1034   CO2 25 09/27/2021 1034   GLUCOSE 162 (H) 09/27/2021 1034   BUN 16 09/27/2021 1034   CREATININE 0.90 09/27/2021 1034   CALCIUM 9.4 09/27/2021 1034   GFRNONAA >60 09/27/2021 1034   GFRAA >60 11/23/2019 1505   COAG Lab Results  Component Value Date   INR 1.3 (H) 09/21/2021   INR 1.1 12/25/2020   INR 1.0 07/25/2019     Disposition:  Discharge to :Home Discharge Instructions     Call MD for:  redness, tenderness, or signs of infection (pain, swelling, bleeding, redness, odor or green/yellow discharge around incision site)   Complete by: As directed    Call MD for:  severe or increased pain, loss or decreased feeling  in affected limb(s)  Complete by: As directed    Call MD for:  temperature >100.5   Complete by: As directed    Driving Restrictions   Complete by: As directed    No driving for 1 weeks   Lifting restrictions   Complete by: As directed    No lifting for >10 lbs for 1 weeks   Resume previous diet   Complete by: As directed       Allergies as of 09/28/2021       Reactions   Vancomycin Rash   Patient  developed a rash to injection site and arm a few mintes after starting ABX.    Hydrocodone Rash   Metformin And Related Diarrhea   Penicillins Hives   Has patient had a PCN reaction causing immediate rash, facial/tongue/throat swelling, SOB or lightheadedness with hypotension: Yes Has patient had a PCN reaction causing severe rash involving mucus membranes or skin necrosis: No Has patient had a PCN reaction that required hospitalization: No Has patient had a PCN reaction occurring within the last 10 years: No If all of the above answers are "NO", then may proceed with Cephalosporin use.   Tramadol Itching        Medication List     STOP taking these medications    HYDROcodone-acetaminophen 5-325 MG tablet Commonly known as: NORCO/VICODIN   moxifloxacin 0.5 % ophthalmic solution Commonly known as: VIGAMOX   Na Sulfate-K Sulfate-Mg Sulf 17.5-3.13-1.6 GM/177ML Soln   prednisoLONE acetate 1 % ophthalmic suspension Commonly known as: PRED FORTE   rosuvastatin 5 MG tablet Commonly known as: CRESTOR       TAKE these medications    acetaminophen 325 MG tablet Commonly known as: TYLENOL Take 650 mg by mouth every 6 (six) hours as needed.   albuterol 108 (90 Base) MCG/ACT inhaler Commonly known as: VENTOLIN HFA Inhale 2 puffs into the lungs every 6 (six) hours as needed for wheezing or shortness of breath.   aspirin 81 MG chewable tablet Chew 1 tablet (81 mg total) by mouth daily. Start taking on: September 29, 2021   atorvastatin 10 MG tablet Commonly known as: LIPITOR Take 10 mg by mouth daily.   buPROPion 150 MG 12 hr tablet Commonly known as: ZYBAN Take 150 mg by mouth 2 (two) times daily.   cetirizine 10 MG tablet Commonly known as: ZYRTEC Take 10 mg by mouth daily.   clopidogrel 75 MG tablet Commonly known as: PLAVIX Take 1 tablet (75 mg total) by mouth daily.   docusate sodium 100 MG capsule Commonly known as: COLACE Take 100 mg by mouth 2 (two) times  daily.   DULoxetine 30 MG capsule Commonly known as: CYMBALTA Take 30 mg by mouth daily.   Eliquis 2.5 MG Tabs tablet Generic drug: apixaban TAKE 1 TABLET BY MOUTH  TWICE DAILY   famotidine 20 MG tablet Commonly known as: Pepcid Take 1 tablet (20 mg total) by mouth at bedtime. What changed: when to take this   ferrous sulfate 325 (65 FE) MG tablet Take 325 mg by mouth daily.   folic acid 1 MG tablet Commonly known as: FOLVITE Take 1 mg by mouth daily.   gabapentin 300 MG capsule Commonly known as: NEURONTIN TAKE 2 CAPSULES BY MOUTH IN THE  MORNING AND 3 CAPSULES BY MOUTH  IN THE EVENING   hydroxychloroquine 200 MG tablet Commonly known as: PLAQUENIL Take 200 mg by mouth daily.   Jardiance 10 MG Tabs tablet Generic drug: empagliflozin Take 10 mg by mouth daily.  lisinopril 10 MG tablet Commonly known as: ZESTRIL Take 1 tablet (10 mg total) by mouth daily.   melatonin 1 MG Tabs tablet Take 1 tablet by mouth at bedtime.   methotrexate 2.5 MG tablet Commonly known as: RHEUMATREX Take 15 mg by mouth once a week.   oxyCODONE-acetaminophen 5-325 MG tablet Commonly known as: PERCOCET/ROXICET Take 1 tablet by mouth every 6 (six) hours as needed for moderate pain.   pantoprazole 40 MG tablet Commonly known as: PROTONIX Take 1 tablet (40 mg total) by mouth daily. Start taking on: September 29, 2021   predniSONE 5 MG tablet Commonly known as: DELTASONE Take 5 mg by mouth daily.   sitaGLIPtin 100 MG tablet Commonly known as: JANUVIA Take 100 mg by mouth daily.   Victoza 18 MG/3ML Sopn Generic drug: liraglutide Inject into the skin.   vitamin B-12 500 MCG tablet Commonly known as: CYANOCOBALAMIN Take 1 tablet (500 mcg total) by mouth daily.   WOMENS MULTIVITAMIN PO Take 1 tablet by mouth daily.       Verbal and written Discharge instructions given to the patient. Wound care per Discharge AVS  Follow-up Information     Georgiana Spinner, NP Follow up in 3  week(s).   Specialty: Vascular Surgery Why: With ABIs...see FB/JD Contact information: [address] [place] [address] [phone]                 Signed: Georgiana Spinner, NP  09/28/2021, 2:49 PM

## 2021-09-28 NOTE — TOC Progression Note (Signed)
Transition of Care [facility](TOC) - Progression Note    Patient Details  Name: Sylvia SensingRoberta [name] MRN: [id number] Date of Birth: 11/03/1955  Transition of Care [facility](TOC) CM/SW Contact  Caryn SectionElena L Victoriano Campion, RN Phone Number: 09/28/2021, 2:38 PM  Clinical Narrative:   [facility] due to insurance No DME recommended for patient at this time as per PT.         Expected Discharge Plan and Services                                       [facility] Date HH Agency Contacted: 09/27/21 Time HH Agency Contacted: [phone] [facility]: Feliberto GottronJason HInton   Social Determinants of Health (SDOH) Interventions    Readmission Risk Interventions     No data to display

## 2021-09-28 NOTE — Progress Notes (Signed)
Physical Therapy Treatment Patient Details Name: Sylvia [name] MRN: [id number] DOB: 24-Jun-1955 Today's Date: 09/28/2021   History of Present Illness 66 y/o female s/p L LE revascualrization, thrombectomy, stent placement, angiogram 6/5-09/2021.  H/o R BKA.    PT Comments    Pt was pleasant and motivated to participate during the session and put forth good effort throughout. Pt is able to complete sit to stand transfers w/ supervision and extra time and effort to complete due to pain and hesitation to weight bear through LLE. Pt was able to ambulate  57ft out into the hallway using RW w/ CGA and required frequent standing rest breaks to complete. Pt is very reliant using UE support in order to advance RLE during gait. Pt will benefit from HHPT upon discharge to safely address deficits listed in patient problem list for decreased caregiver assistance and eventual return to PLOF.   Recommendations for follow up therapy are one component of a multi-disciplinary discharge planning process, led by the attending physician.  Recommendations may be updated based on patient status, additional functional criteria and insurance authorization.  Follow Up Recommendations  Home health PT     Assistance Recommended at Discharge Intermittent Supervision/Assistance  Patient can return home with the following Assistance with cooking/housework;Assist for transportation;A little help with walking and/or transfers;A little help with bathing/dressing/bathroom;Help with stairs or ramp for entrance   Equipment Recommendations  None recommended by PT    Recommendations for Other Services       Precautions / Restrictions Precautions Precautions: Fall Restrictions Weight Bearing Restrictions: No     Mobility  Bed Mobility Overal bed mobility: Independent                  Transfers Overall transfer level: Needs assistance Equipment used: Rolling walker (2 wheels) Transfers: Sit to/from  Stand Sit to Stand: Supervision           General transfer comment: Pt required extra time and effort to complete w/ some hesitation to bear weight through LLE    Ambulation/Gait Ambulation/Gait assistance: Min guard Gait Distance (Feet): 60 Feet Assistive device: Rolling walker (2 wheels) Gait Pattern/deviations: Step-to pattern, Decreased step length - right, Decreased step length - left, Decreased stance time - left Gait velocity: decreased     General Gait Details: pt w/ very slow gait and required standing breaks during ambulation and w/ heavy UE reliance to advance RLE   Stairs             Wheelchair Mobility    Modified Rankin (Stroke Patients Only)       Balance Overall balance assessment: Needs assistance Sitting-balance support: Feet supported Sitting balance-Leahy Scale: Normal     Standing balance support: Bilateral upper extremity supported, During functional activity Standing balance-Leahy Scale: Fair Standing balance comment: hesitant with WBing on L, prosthetic on R - no LOBs but clearly reliant on walker                            Cognition Arousal/Alertness: Awake/alert Behavior During Therapy: WFL for tasks assessed/performed Overall Cognitive Status: Within Functional Limits for tasks assessed                                          Exercises      General Comments        Pertinent Vitals/Pain Pain  Assessment Pain Assessment: 0-10 Pain Score: 6  Pain Location: L leg Pain Descriptors / Indicators: Aching Pain Intervention(s): Monitored during session, Premedicated before session, Repositioned    Home Living                          Prior Function            PT Goals (current goals can now be found in the care plan section) Progress towards PT goals: Progressing toward goals    Frequency    Min 2X/week      PT Plan Current plan remains appropriate    Co-evaluation               AM-PAC PT "6 Clicks" Mobility   Outcome Measure  Help needed turning from your back to your side while in a flat bed without using bedrails?: None Help needed moving from lying on your back to sitting on the side of a flat bed without using bedrails?: None Help needed moving to and from a bed to a chair (including a wheelchair)?: A Little Help needed standing up from a chair using your arms (e.g., wheelchair or bedside chair)?: A Little Help needed to walk in hospital room?: A Little Help needed climbing 3-5 steps with a railing? : Total 6 Click Score: 18    End of Session Equipment Utilized During Treatment: Gait belt Activity Tolerance: Patient tolerated treatment well;No increased pain Patient left: with chair alarm set;with call bell/phone within reach;in chair Nurse Communication: Mobility status PT Visit Diagnosis: Muscle weakness (generalized) (M62.81);Difficulty in walking, not elsewhere classified (R26.2);Pain Pain - Right/Left: Left Pain - part of body: Leg     Time: [id number] PT Time Calculation (min) (ACUTE ONLY): 25 min  Charges:                        Turner Daniels, SPT  09/28/2021, 10:17 AM

## 2021-09-29 ENCOUNTER — Telehealth (INDEPENDENT_AMBULATORY_CARE_PROVIDER_SITE_OTHER): Payer: Self-pay

## 2021-09-29 NOTE — TOC Progression Note (Signed)
Transition of Care [facility](TOC) - Progression Note    Patient Details  Name: Tona SensingRoberta Litsey MRN: [id number] Date of Birth: 04/23/1955  Transition of Care [facility](TOC) CM/SW Contact  Caryn SectionElena L Christophere Hillhouse, RN Phone Number: 09/29/2021, 10:43 AM  Clinical Narrative:   [facility].  As [facility] was unable to secure Home Health, RNCM spoke with Physical Therapist who saw patient yesterday, who stated that outpatient therapy would be a feasible option for patient given her condition on discharge.  RNCM spoke to patient, who agreed to Outpatient PT, and stated her husband would be able to transport her here to Outpatient PT in the hospital.  Outpatient referral sent to therapy here at the hospital, with hospitalist signature.    RNCM spoke with patient, who states she is feeling better and does not have further discharge needs at this time.         Expected Discharge Plan and Services           Expected Discharge Date: 09/28/21                           [facility] Date HH Agency Contacted: 09/27/21 Time HH Agency Contacted: [id number] [facility] Gottron[facility]   Social Determinants of Health (SDOH) Interventions    Readmission Risk Interventions     No data to display

## 2021-09-29 NOTE — Telephone Encounter (Signed)
Patient call stating the pain medication was not working and she is taking 2 at a time in order for it to help at all. Patient wants something stronger. Patient had a left leg angio with Dr. Wyn Quaker on 09/21/21. Please advise. Thank you.

## 2021-10-02 LAB — SURGICAL PATHOLOGY

## 2021-10-08 ENCOUNTER — Other Ambulatory Visit: Payer: Self-pay

## 2021-10-08 DIAGNOSIS — Z8619 Personal history of other infectious and parasitic diseases: Secondary | ICD-10-CM

## 2021-10-09 MED ORDER — CLARITHROMYCIN 500 MG PO TABS: 500.0000 mg | ORAL_TABLET | Freq: Two times a day (BID) | ORAL | 0 refills | Status: DC

## 2021-10-09 MED ORDER — PANTOPRAZOLE SODIUM 20 MG PO TBEC: 20.0000 mg | DELAYED_RELEASE_TABLET | Freq: Two times a day (BID) | ORAL | 0 refills | Status: DC

## 2021-10-09 MED ORDER — METRONIDAZOLE 500 MG PO TABS: 500.0000 mg | ORAL_TABLET | Freq: Three times a day (TID) | ORAL | 0 refills | Status: DC

## 2021-10-12 ENCOUNTER — Other Ambulatory Visit (INDEPENDENT_AMBULATORY_CARE_PROVIDER_SITE_OTHER): Payer: Self-pay | Admitting: Nurse Practitioner

## 2021-10-12 ENCOUNTER — Ambulatory Visit (INDEPENDENT_AMBULATORY_CARE_PROVIDER_SITE_OTHER): Payer: Medicare Other

## 2021-10-12 ENCOUNTER — Telehealth (INDEPENDENT_AMBULATORY_CARE_PROVIDER_SITE_OTHER): Payer: Self-pay

## 2021-10-12 DIAGNOSIS — M79605 Pain in left leg: Secondary | ICD-10-CM

## 2021-10-12 DIAGNOSIS — I739 Peripheral vascular disease, unspecified: Secondary | ICD-10-CM | POA: Diagnosis not present

## 2021-10-12 DIAGNOSIS — Z9889 Other specified postprocedural states: Secondary | ICD-10-CM

## 2021-10-12 NOTE — Telephone Encounter (Signed)
Patient was seen today and scheduled with Dr. Gilda Crease for a left leg angio on 10/13/21 with a 10:15 am arrival time to the MM. Pre-procedure instructions were discussed and handed to the patient.

## 2021-10-13 ENCOUNTER — Encounter
Admission: AD | Disposition: A | Discharge status: Home / Self Care | Payer: Self-pay | Source: Home / Self Care | Attending: Vascular Surgery

## 2021-10-13 ENCOUNTER — Other Ambulatory Visit: Payer: Self-pay

## 2021-10-13 ENCOUNTER — Inpatient Hospital Stay
Admission: AD | Admit: 2021-10-13 | Discharge: 2021-10-20 | DRG: 271 | Disposition: A | Discharge status: Home / Self Care | Payer: Medicare Other | Attending: Vascular Surgery | Admitting: Vascular Surgery

## 2021-10-13 ENCOUNTER — Encounter: Payer: Self-pay | Admitting: Vascular Surgery

## 2021-10-13 DIAGNOSIS — Z8616 Personal history of COVID-19: Secondary | ICD-10-CM

## 2021-10-13 DIAGNOSIS — Z79631 Long term (current) use of antimetabolite agent: Secondary | ICD-10-CM

## 2021-10-13 DIAGNOSIS — Z880 Allergy status to penicillin: Secondary | ICD-10-CM | POA: Diagnosis not present

## 2021-10-13 DIAGNOSIS — Z885 Allergy status to narcotic agent status: Secondary | ICD-10-CM | POA: Diagnosis not present

## 2021-10-13 DIAGNOSIS — J449 Chronic obstructive pulmonary disease, unspecified: Secondary | ICD-10-CM | POA: Diagnosis present

## 2021-10-13 DIAGNOSIS — G8918 Other acute postprocedural pain: Secondary | ICD-10-CM | POA: Diagnosis not present

## 2021-10-13 DIAGNOSIS — F419 Anxiety disorder, unspecified: Secondary | ICD-10-CM | POA: Diagnosis present

## 2021-10-13 DIAGNOSIS — Z888 Allergy status to other drugs, medicaments and biological substances status: Secondary | ICD-10-CM

## 2021-10-13 DIAGNOSIS — Z79899 Other long term (current) drug therapy: Secondary | ICD-10-CM

## 2021-10-13 DIAGNOSIS — I998 Other disorder of circulatory system: Secondary | ICD-10-CM | POA: Diagnosis not present

## 2021-10-13 DIAGNOSIS — K219 Gastro-esophageal reflux disease without esophagitis: Secondary | ICD-10-CM | POA: Diagnosis present

## 2021-10-13 DIAGNOSIS — E1151 Type 2 diabetes mellitus with diabetic peripheral angiopathy without gangrene: Principal | ICD-10-CM | POA: Diagnosis present

## 2021-10-13 DIAGNOSIS — Z961 Presence of intraocular lens: Secondary | ICD-10-CM | POA: Diagnosis present

## 2021-10-13 DIAGNOSIS — M069 Rheumatoid arthritis, unspecified: Secondary | ICD-10-CM | POA: Diagnosis present

## 2021-10-13 DIAGNOSIS — D638 Anemia in other chronic diseases classified elsewhere: Secondary | ICD-10-CM | POA: Diagnosis present

## 2021-10-13 DIAGNOSIS — Z7984 Long term (current) use of oral hypoglycemic drugs: Secondary | ICD-10-CM

## 2021-10-13 DIAGNOSIS — I743 Embolism and thrombosis of arteries of the lower extremities: Secondary | ICD-10-CM | POA: Diagnosis not present

## 2021-10-13 DIAGNOSIS — I70222 Atherosclerosis of native arteries of extremities with rest pain, left leg: Secondary | ICD-10-CM | POA: Diagnosis present

## 2021-10-13 DIAGNOSIS — Z881 Allergy status to other antibiotic agents status: Secondary | ICD-10-CM

## 2021-10-13 DIAGNOSIS — I10 Essential (primary) hypertension: Secondary | ICD-10-CM | POA: Diagnosis present

## 2021-10-13 DIAGNOSIS — Z7952 Long term (current) use of systemic steroids: Secondary | ICD-10-CM

## 2021-10-13 DIAGNOSIS — I70229 Atherosclerosis of native arteries of extremities with rest pain, unspecified extremity: Secondary | ICD-10-CM | POA: Diagnosis present

## 2021-10-13 DIAGNOSIS — Z89511 Acquired absence of right leg below knee: Secondary | ICD-10-CM

## 2021-10-13 DIAGNOSIS — Z90710 Acquired absence of both cervix and uterus: Secondary | ICD-10-CM | POA: Diagnosis not present

## 2021-10-13 DIAGNOSIS — Z87891 Personal history of nicotine dependence: Secondary | ICD-10-CM

## 2021-10-13 DIAGNOSIS — D62 Acute posthemorrhagic anemia: Secondary | ICD-10-CM | POA: Diagnosis not present

## 2021-10-13 DIAGNOSIS — Z9049 Acquired absence of other specified parts of digestive tract: Secondary | ICD-10-CM | POA: Diagnosis not present

## 2021-10-13 DIAGNOSIS — G8929 Other chronic pain: Secondary | ICD-10-CM | POA: Diagnosis present

## 2021-10-13 DIAGNOSIS — Z7985 Long-term (current) use of injectable non-insulin antidiabetic drugs: Secondary | ICD-10-CM

## 2021-10-13 DIAGNOSIS — T82868A Thrombosis of vascular prosthetic devices, implants and grafts, initial encounter: Secondary | ICD-10-CM | POA: Diagnosis not present

## 2021-10-13 HISTORY — PX: LOWER EXTREMITY ANGIOGRAPHY: CATH118251

## 2021-10-13 LAB — HEPARIN LEVEL (UNFRACTIONATED): Heparin Unfractionated: 0.61 IU/mL (ref 0.30–0.70)

## 2021-10-13 LAB — FIBRINOGEN: Fibrinogen: 359 mg/dL (ref 210–475)

## 2021-10-13 LAB — CREATININE, SERUM
Creatinine, Ser: 0.88 mg/dL (ref 0.44–1.00)
GFR, Estimated: >60 mL/min (ref >60)

## 2021-10-13 LAB — CBC
HCT: 29.8 % — ABNORMAL LOW (ref 36.0–46.0)
Hemoglobin: 9.9 g/dL — ABNORMAL LOW (ref 12.0–15.0)
MCH: 28 pg (ref 26.0–34.0)
MCHC: 33.2 g/dL (ref 30.0–36.0)
MCV: 84.4 fL (ref 80.0–100.0)
Platelets: 342 10*3/uL (ref 150–400)
RBC: 3.53 MIL/uL — ABNORMAL LOW (ref 3.87–5.11)
RDW: 14 % (ref 11.5–15.5)
WBC: 14.5 10*3/uL — ABNORMAL HIGH (ref 4.0–10.5)
nRBC: 0 % (ref 0.0–0.2)

## 2021-10-13 LAB — GLUCOSE, CAPILLARY
Glucose-Capillary: 255 mg/dL — ABNORMAL HIGH (ref 70–99)
Glucose-Capillary: 258 mg/dL — ABNORMAL HIGH (ref 70–99)
Glucose-Capillary: 260 mg/dL — ABNORMAL HIGH (ref 70–99)
Glucose-Capillary: 229 mg/dL — ABNORMAL HIGH (ref 70–99)

## 2021-10-13 LAB — MRSA NEXT GEN BY PCR, NASAL: MRSA by PCR Next Gen: NOT DETECTED

## 2021-10-13 LAB — BUN: BUN: 17 mg/dL (ref 8–23)

## 2021-10-13 SURGERY — LOWER EXTREMITY ANGIOGRAPHY
Anesthesia: Moderate Sedation | Site: Leg Lower | Laterality: Left

## 2021-10-13 MED ORDER — ONDANSETRON HCL 4 MG/2ML IJ SOLN
4.0000 mg | Freq: Four times a day (QID) | INTRAMUSCULAR | Status: DC | PRN
Start: 2021-10-13 — End: 2021-10-13

## 2021-10-13 MED ORDER — HYDRALAZINE HCL 20 MG/ML IJ SOLN
INTRAMUSCULAR | Status: DC | PRN
  Administered 2021-10-13: 10 mg via INTRAVENOUS

## 2021-10-13 MED ORDER — ALTEPLASE 2 MG IJ SOLR
INTRAMUSCULAR | Status: AC
  Filled 2021-10-13: qty 8

## 2021-10-13 MED ORDER — DIPHENHYDRAMINE HCL 50 MG/ML IJ SOLN: 50.0000 mg | Freq: Once | INTRAMUSCULAR | Status: DC | PRN

## 2021-10-13 MED ORDER — LABETALOL HCL 5 MG/ML IV SOLN
INTRAVENOUS | Status: DC | PRN
  Administered 2021-10-13: 10 mg via INTRAVENOUS
  Administered 2021-10-13: 20 mg via INTRAVENOUS

## 2021-10-13 MED ORDER — SODIUM CHLORIDE 0.9 % IV SOLN: INTRAVENOUS | Status: DC

## 2021-10-13 MED ORDER — GABAPENTIN 300 MG PO CAPS
600.0000 mg | ORAL_CAPSULE | Freq: Every day | ORAL | Status: DC
  Administered 2021-10-14 – 2021-10-20 (×7): 600 mg via ORAL
  Filled 2021-10-13 (×8): qty 2

## 2021-10-13 MED ORDER — METHYLPREDNISOLONE SODIUM SUCC 125 MG IJ SOLR
125.0000 mg | Freq: Once | INTRAMUSCULAR | Status: DC | PRN
Start: 2021-10-13 — End: 2021-10-13

## 2021-10-13 MED ORDER — MIDAZOLAM HCL 2 MG/2ML IJ SOLN
INTRAMUSCULAR | Status: DC | PRN
  Administered 2021-10-13 (×2): 1 mg via INTRAVENOUS
  Administered 2021-10-13: 2 mg via INTRAVENOUS

## 2021-10-13 MED ORDER — GABAPENTIN 300 MG PO CAPS
900.0000 mg | ORAL_CAPSULE | Freq: Every day | ORAL | Status: DC
  Administered 2021-10-13 – 2021-10-19 (×7): 900 mg via ORAL
  Filled 2021-10-13 (×8): qty 3

## 2021-10-13 MED ORDER — ALTEPLASE 2 MG IJ SOLR: 0.5000 mg/h | INTRAMUSCULAR | Status: DC

## 2021-10-13 MED ORDER — HYDRALAZINE HCL 20 MG/ML IJ SOLN
INTRAMUSCULAR | Status: AC
  Filled 2021-10-13: qty 1

## 2021-10-13 MED ORDER — ALBUTEROL SULFATE HFA 108 (90 BASE) MCG/ACT IN AERS
2.0000 | INHALATION_SPRAY | Freq: Four times a day (QID) | RESPIRATORY_TRACT | Status: DC | PRN
Start: 2021-10-13 — End: 2021-10-20

## 2021-10-13 MED ORDER — HEPARIN SODIUM (PORCINE) 1000 UNIT/ML IJ SOLN
INTRAMUSCULAR | Status: DC | PRN
  Administered 2021-10-13: 4000 [IU] via INTRAVENOUS

## 2021-10-13 MED ORDER — IODIXANOL 320 MG/ML IV SOLN
INTRAVENOUS | Status: DC | PRN
  Administered 2021-10-13: 50 mL

## 2021-10-13 MED ORDER — SODIUM CHLORIDE 0.9% FLUSH: 3.0000 mL | INTRAVENOUS | Status: DC | PRN

## 2021-10-13 MED ORDER — MIDAZOLAM HCL 2 MG/2ML IJ SOLN: 1.0000 mg | INTRAMUSCULAR | Status: DC | PRN

## 2021-10-13 MED ORDER — ONDANSETRON HCL 4 MG/2ML IJ SOLN
4.0000 mg | Freq: Four times a day (QID) | INTRAMUSCULAR | Status: DC | PRN
  Administered 2021-10-13: 4 mg via INTRAVENOUS
  Filled 2021-10-13: qty 2

## 2021-10-13 MED ORDER — LABETALOL HCL 5 MG/ML IV SOLN
INTRAVENOUS | Status: AC
  Filled 2021-10-13: qty 4

## 2021-10-13 MED ORDER — GABAPENTIN 300 MG PO CAPS: 300.0000 mg | ORAL_CAPSULE | Freq: Every day | ORAL | Status: DC

## 2021-10-13 MED ORDER — HEPARIN (PORCINE) 25000 UT/250ML-% IV SOLN
400.0000 [IU]/h | INTRAVENOUS | Status: DC
Start: 2021-10-13 — End: 2021-10-14
  Administered 2021-10-13: 400 [IU]/h via INTRA_ARTERIAL

## 2021-10-13 MED ORDER — FENTANYL CITRATE (PF) 100 MCG/2ML IJ SOLN
INTRAMUSCULAR | Status: AC
  Filled 2021-10-13: qty 2

## 2021-10-13 MED ORDER — HYDROMORPHONE HCL 1 MG/ML IJ SOLN
INTRAMUSCULAR | Status: AC
  Filled 2021-10-13: qty 1

## 2021-10-13 MED ORDER — SODIUM CHLORIDE 0.9 % IV SOLN
0.5000 mg/h | INTRAVENOUS | Status: DC
  Administered 2021-10-13: 0.5 mg/h
  Filled 2021-10-13: qty 10

## 2021-10-13 MED ORDER — HYDROMORPHONE HCL 1 MG/ML IJ SOLN
INTRAMUSCULAR | Status: AC
  Administered 2021-10-13: 1 mg via INTRAVENOUS
  Filled 2021-10-13: qty 1

## 2021-10-13 MED ORDER — OXYCODONE-ACETAMINOPHEN 5-325 MG PO TABS
1.0000 | ORAL_TABLET | Freq: Four times a day (QID) | ORAL | Status: DC | PRN
  Administered 2021-10-13: 2 via ORAL
  Administered 2021-10-14: 1 via ORAL
  Administered 2021-10-14 – 2021-10-20 (×8): 2 via ORAL
  Filled 2021-10-13 (×6): qty 2
  Filled 2021-10-13: qty 1
  Filled 2021-10-13 (×3): qty 2

## 2021-10-13 MED ORDER — HEPARIN SODIUM (PORCINE) 1000 UNIT/ML IJ SOLN
INTRAMUSCULAR | Status: AC
  Filled 2021-10-13: qty 10

## 2021-10-13 MED ORDER — CHLORHEXIDINE GLUCONATE CLOTH 2 % EX PADS
6.0000 | MEDICATED_PAD | Freq: Every day | CUTANEOUS | Status: DC
  Administered 2021-10-13 – 2021-10-18 (×4): 6 via TOPICAL

## 2021-10-13 MED ORDER — INSULIN ASPART 100 UNIT/ML IJ SOLN
0.0000 [IU] | Freq: Three times a day (TID) | INTRAMUSCULAR | Status: DC
  Administered 2021-10-13: 5 [IU] via SUBCUTANEOUS
  Administered 2021-10-13: 8 [IU] via SUBCUTANEOUS
  Administered 2021-10-14: 5 [IU] via SUBCUTANEOUS
  Administered 2021-10-14: 8 [IU] via SUBCUTANEOUS
  Administered 2021-10-14: 5 [IU] via SUBCUTANEOUS
  Administered 2021-10-15: 3 [IU] via SUBCUTANEOUS
  Administered 2021-10-15: 5 [IU] via SUBCUTANEOUS
  Administered 2021-10-15: 11 [IU] via SUBCUTANEOUS
  Administered 2021-10-16 (×2): 5 [IU] via SUBCUTANEOUS
  Administered 2021-10-17 (×3): 8 [IU] via SUBCUTANEOUS
  Administered 2021-10-18: 3 [IU] via SUBCUTANEOUS
  Administered 2021-10-18: 5 [IU] via SUBCUTANEOUS
  Administered 2021-10-18: 3 [IU] via SUBCUTANEOUS
  Administered 2021-10-19: 6 [IU] via SUBCUTANEOUS
  Administered 2021-10-19: 3 [IU] via SUBCUTANEOUS
  Administered 2021-10-20: 8 [IU] via SUBCUTANEOUS
  Administered 2021-10-20: 5 [IU] via SUBCUTANEOUS
  Filled 2021-10-13 (×21): qty 1

## 2021-10-13 MED ORDER — HEPARIN (PORCINE) 25000 UT/250ML-% IV SOLN
INTRAVENOUS | Status: AC
  Administered 2021-10-13: 400 [IU]/h via INTRA_ARTERIAL
  Filled 2021-10-13: qty 250

## 2021-10-13 MED ORDER — HYDROMORPHONE HCL 1 MG/ML IJ SOLN: 1.0000 mg | Freq: Once | INTRAMUSCULAR | Status: AC | PRN

## 2021-10-13 MED ORDER — ALTEPLASE 1 MG/ML SYRINGE FOR VASCULAR PROCEDURE
INTRAMUSCULAR | Status: DC | PRN
  Administered 2021-10-13: 8 mg

## 2021-10-13 MED ORDER — LISINOPRIL 10 MG PO TABS
10.0000 mg | ORAL_TABLET | Freq: Every day | ORAL | Status: DC
  Administered 2021-10-13 – 2021-10-20 (×8): 10 mg via ORAL
  Filled 2021-10-13 (×2): qty 2
  Filled 2021-10-13: qty 1
  Filled 2021-10-13 (×2): qty 2
  Filled 2021-10-13: qty 1
  Filled 2021-10-13 (×2): qty 2

## 2021-10-13 MED ORDER — FAMOTIDINE 20 MG PO TABS: 40.0000 mg | ORAL_TABLET | Freq: Once | ORAL | Status: DC | PRN

## 2021-10-13 MED ORDER — DIPHENHYDRAMINE HCL 50 MG/ML IJ SOLN: 25.0000 mg | Freq: Once | INTRAMUSCULAR | Status: DC

## 2021-10-13 MED ORDER — FENTANYL CITRATE PF 50 MCG/ML IJ SOSY
PREFILLED_SYRINGE | INTRAMUSCULAR | Status: AC
  Filled 2021-10-13: qty 1

## 2021-10-13 MED ORDER — HYDRALAZINE HCL 20 MG/ML IJ SOLN
5.0000 mg | INTRAMUSCULAR | Status: AC | PRN
  Administered 2021-10-13 (×2): 5 mg via INTRAVENOUS
  Filled 2021-10-13 (×2): qty 1

## 2021-10-13 MED ORDER — HYDROMORPHONE HCL 1 MG/ML IJ SOLN
1.0000 mg | Freq: Once | INTRAMUSCULAR | Status: AC
  Administered 2021-10-13: 1 mg via INTRAVENOUS

## 2021-10-13 MED ORDER — CIPROFLOXACIN IN D5W 400 MG/200ML IV SOLN: 400.0000 mg | INTRAVENOUS | Status: AC

## 2021-10-13 MED ORDER — CIPROFLOXACIN IN D5W 400 MG/200ML IV SOLN
INTRAVENOUS | Status: AC
  Administered 2021-10-13: 400 mg via INTRAVENOUS
  Filled 2021-10-13: qty 200

## 2021-10-13 MED ORDER — SODIUM CHLORIDE 0.9 % IV SOLN: 0.5000 mg/h | INTRAVENOUS | Status: DC

## 2021-10-13 MED ORDER — MIDAZOLAM HCL 2 MG/ML PO SYRP: 8.0000 mg | ORAL_SOLUTION | Freq: Once | ORAL | Status: DC | PRN

## 2021-10-13 MED ORDER — METHYLPREDNISOLONE SODIUM SUCC 125 MG IJ SOLR
40.0000 mg | Freq: Once | INTRAMUSCULAR | Status: AC
  Administered 2021-10-13: 40 mg via INTRAVENOUS
  Filled 2021-10-13: qty 2

## 2021-10-13 MED ORDER — SODIUM CHLORIDE 0.9% FLUSH
3.0000 mL | Freq: Two times a day (BID) | INTRAVENOUS | Status: DC
  Administered 2021-10-13 – 2021-10-17 (×7): 3 mL via INTRAVENOUS

## 2021-10-13 MED ORDER — MIDAZOLAM HCL 2 MG/2ML IJ SOLN
INTRAMUSCULAR | Status: AC
  Filled 2021-10-13: qty 2

## 2021-10-13 MED ORDER — LABETALOL HCL 5 MG/ML IV SOLN: 10.0000 mg | INTRAVENOUS | Status: DC | PRN

## 2021-10-13 MED ORDER — HYDROMORPHONE HCL 1 MG/ML IJ SOLN
INTRAMUSCULAR | Status: DC | PRN
  Administered 2021-10-13: 1 mg via INTRAVENOUS

## 2021-10-13 MED ORDER — SODIUM CHLORIDE 0.9 % IV SOLN: 250.0000 mL | INTRAVENOUS | Status: DC | PRN

## 2021-10-13 MED ORDER — SODIUM CHLORIDE FLUSH 0.9 % IV SOLN
INTRAVENOUS | Status: AC
  Filled 2021-10-13: qty 20

## 2021-10-13 MED ORDER — SODIUM CHLORIDE 0.9 % IV SOLN
0.5000 mg/h | INTRAVENOUS | Status: AC
  Filled 2021-10-13: qty 10

## 2021-10-13 MED ORDER — HEPARIN (PORCINE) 25000 UT/250ML-% IV SOLN: 400.0000 [IU]/h | INTRAVENOUS | Status: DC

## 2021-10-13 MED ORDER — FENTANYL CITRATE (PF) 100 MCG/2ML IJ SOLN
INTRAMUSCULAR | Status: DC | PRN
  Administered 2021-10-13 (×3): 50 ug via INTRAVENOUS

## 2021-10-13 MED ORDER — HYDROMORPHONE HCL 1 MG/ML IJ SOLN
1.0000 mg | INTRAMUSCULAR | Status: DC | PRN
  Administered 2021-10-13 – 2021-10-20 (×19): 1 mg via INTRAVENOUS
  Filled 2021-10-13 (×19): qty 1

## 2021-10-13 MED ORDER — STERILE WATER FOR INJECTION IJ SOLN
INTRAMUSCULAR | Status: AC
  Filled 2021-10-13: qty 10

## 2021-10-13 SURGICAL SUPPLY — 26 items
BALLN ULTRVRSE 3X300X150 (BALLOONS) ×2
BALLN ULTRVRSE 3X300X150 OTW (BALLOONS) ×1
BALLOON ULTRVRSE 3X300X150 OTW (BALLOONS) IMPLANT
CATH ANGIO 5F PIGTAIL 65CM (CATHETERS) ×1 IMPLANT
CATH INFUS 135CMX50CM (CATHETERS) ×1 IMPLANT
CATH NAVICROSS ANGLED 135CM (MICROCATHETER) ×1 IMPLANT
CATH ROTAREX 135 6FR (CATHETERS) ×1 IMPLANT
CATH TEMPO 5F RIM 65CM (CATHETERS) ×1 IMPLANT
COVER DRAPE FLUORO 36X44 (DRAPES) ×1 IMPLANT
COVER PROBE U/S 5X48 (MISCELLANEOUS) ×2 IMPLANT
GLIDEWIRE ADV .035X260CM (WIRE) ×1 IMPLANT
KIT CV MULTILUMEN 7FR 20 (SET/KITS/TRAYS/PACK) ×2
KIT CV MULTILUMEN 7FR 20 SUB (SET/KITS/TRAYS/PACK) IMPLANT
KIT ENCORE 26 ADVANTAGE (KITS) ×1 IMPLANT
NDL ENTRY 21GA 7CM ECHOTIP (NEEDLE) IMPLANT
NEEDLE ENTRY 21GA 7CM ECHOTIP (NEEDLE) ×2 IMPLANT
PACK ANGIOGRAPHY (CUSTOM PROCEDURE TRAY) ×2 IMPLANT
SET INTRO CAPELLA COAXIAL (SET/KITS/TRAYS/PACK) ×1 IMPLANT
SHEATH BRITE TIP 5FRX11 (SHEATH) ×1 IMPLANT
SHEATH PINNACLE ST 6F 45CM (SHEATH) ×1 IMPLANT
SUT SILK 0 FSL (SUTURE) ×1 IMPLANT
SYR MEDRAD MARK 7 150ML (SYRINGE) ×1 IMPLANT
TUBING CONTRAST HIGH PRESS 72 (TUBING) ×1 IMPLANT
WIRE G V18X300CM (WIRE) ×1 IMPLANT
WIRE GUIDERIGHT .035X150 (WIRE) ×1 IMPLANT
WIRE ROSEN-J .035X260CM (WIRE) ×1 IMPLANT

## 2021-10-13 NOTE — Op Note (Signed)
[facility]  Percutaneous Study/Intervention Procedural Note   Date of Surgery: 10/13/2021  Surgeon:  Renford Dills, MD.  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  Introduction catheter into left lower extremity 3rd order catheter placement              2.  Contrast injection left lower extremity for distal runoff             3.  Infusion of TPA for thrombolysis; subsequent continued thrombolysis             4.   Mechanical thrombectomy with the [place] Rex device left SFA and popliteal             5.   Percutaneous transluminal angioplasty left superficial femoral artery and popliteal.             6.   Insertion triple-lumen catheter with ultrasound guidance right common femoral vein          Anesthesia: Conscious sedation was administered under my direct supervision by the interventional radiology RN. IV Versed plus fentanyl were utilized. Continuous ECG, pulse oximetry and blood pressure was monitored throughout the entire procedure. Conscious sedation was for a total of 90 minutes.  Sheath: 6 French destination sheath right common femoral retrograde  Contrast: 50 cc  Fluoroscopy Time: 7.5 minutes  Indications:  Sylvia [name] presents with recurrent ischemia of her left lower extremity.  This places her at a high risk for limb loss.  The risks and benefits of angiography with intervention for limb salvage are reviewed all questions answered patient agrees to proceed.  Procedure:  Sylvia [name] is a 66 y.o. y.o. female who was identified and appropriate procedural time out was performed.  The patient was then placed supine on the table and prepped and draped in the usual sterile fashion.    Ultrasound was placed in the sterile sleeve and the right groin was evaluated the right common femoral artery was  echolucent and pulsatile indicating patency.  Image was recorded for the permanent record and under real-time visualization a microneedle was inserted into the common femoral artery microwire followed by a micro-sheath.  A J-wire was then advanced through the micro-sheath and a  5 [place] sheath was then inserted over a J-wire. J-wire was then advanced and a 5 French pigtail catheter was positioned at the level of T12. AP projection of the aorta was then obtained. Pigtail catheter was repositioned to above the bifurcation and a RAO view of the pelvis was obtained.  Subsequently a rim catheter with the advantage Glidewire was used to cross the aortic bifurcation the catheter wire were advanced down into the left distal external iliac artery. Oblique view of the femoral bifurcation was then obtained and subsequently the wire was reintroduced and the pigtail catheter negotiated into the SFA representing third order catheter placement. Distal runoff was then performed.  Diagnostic interpretation: The abdominal aorta is opacified with a bolus injection contrast.  It is free of hemodynamically significant stenoses.  There does appear to be hemodynamically significant stenosis of the left common iliac extending throughout its length.  The origin of the left internal iliac is a string sign.  The left external iliac artery demonstrates a previously placed stent and appears widely patent.  There appears to be a focal hemodynamically significant stenosis  of the right common iliac as well as a hemodynamically significant stenosis in the right external iliac.  The left common femoral demonstrates moderate to severe disease estimated at 60 to 70%.  There is a high-grade stenosis greater than 80% that extends over approximately 10 mm at the origin of the profunda femoris.  The profunda femoris is otherwise well collateralized and free of hemodynamically significant stenosis.  Previously placed stents in the SFA are noted and  there is a flush occlusion observed under initial imaging.  Distally I was able to select the anterior tibial with a Nava cross catheter and this appears to be patent from near its origin down.  Later in the case I was able to select the peroneal artery and this appears to be patent from just distal to its origin distally.  The SFA remains occluded throughout its entire length as is the popliteal.  5000 units of heparin was then given and allowed to circulate and a 6 [place] destination sheath was advanced up and over the bifurcation and positioned in the femoral artery  TPA was then reconstituted and a infusion catheter with a 50 cm infusion length was advanced down so that its tip was positioned in the proximal anterior tibial and 8 milligrams of TPA is then infused across the entire length of the SFA and popliteal. TPA was then allowed to dwell for 30 minutes. Follow-up imaging demonstrated incomplete thrombolysis and therefore the [place] Rex catheter was used to achieve a more thorough thrombectomy. Multiple passes were made after which follow-up imaging was obtained which showed recanalization of the SFA but significant residual thrombus   A 3 mm x 30 cm balloon was used to angioplasty the entire length of the SFA and popliteal arteries. Inflations were to 10 atmospheres for 1 minute. Distal runoff was then reassessed.  Follow-up imaging demonstrated moderate improvement but significant residual thrombus and based on this I elected to initiate a tPA infusion overnight.  After review of these images the sheath is secured to skin with suture. An infusion catheter is then advanced over the wire and positioned across the arterial SFA and popliteal. The obturating wires then placed in the catheter is flushed with heparinized saline. The sheath is also flushed with heparinized saline. The infusion of TPA will begin once pharmacy is reconstituted the appropriate dose.  Attention is then turned to the right  groin and the ultrasound was reprepped and redraped in sterile fashion.  Ultrasound is used to identify the right common femoral vein.  It is echolucent and compressible indicating patency.  The common femoral vein image is then recorded for the permanent record and under direct visualization with the ultrasound a microneedle is inserted after 1% lidocaine has been infiltrated into the soft tissues.  Microwire followed by microsheath was then inserted.  J-wire was then advanced.  Dilators but advanced over the wire and the triple-lumen catheter was fed without difficulty.  All 3 lm aspirated and flushed easily and caps were placed.  The catheter secured to the skin of the thigh with a 0 silk suture.  Biopatch and sterile dressing is applied.   Findings:  The abdominal aorta is opacified with a bolus injection contrast.  It is free of hemodynamically significant stenoses.  There does appear to be hemodynamically significant stenosis of the left common iliac extending throughout its length.  The origin of the left internal iliac is a string sign.  The left external iliac artery demonstrates a previously placed stent and appears  widely patent.  There appears to be a focal hemodynamically significant stenosis of the right common iliac as well as a hemodynamically significant stenosis in the right external iliac.  The left common femoral demonstrates moderate to severe disease estimated at 60 to 70%.  There is a high-grade stenosis greater than 80% that extends over approximately 10 mm at the origin of the profunda femoris.  The profunda femoris is otherwise well collateralized and free of hemodynamically significant stenosis.  Previously placed stents in the SFA are noted and there is a flush occlusion observed under initial imaging.  Distally I was able to select the anterior tibial with a Nava cross catheter and this appears to be patent from near its origin down.  Later in the case I was able to select the  peroneal artery and this appears to be patent from just distal to its origin distally.  The SFA remains occluded throughout its entire length as is the popliteal.  Following the initial 8 mg of tPA subsequently thrombolysis with the [place] Rex catheter and then angioplasty there is now flow restored however a large amount of residual thrombus remains and therefore tPA is being initiated overnight as described above.  Disposition: Patient was taken to the recovery room in stable condition having tolerated the procedure well. He will be transferred to the intensive care unit when a bed becomes available  Levora Dredge 10/13/2021,3:43 PM

## 2021-10-13 NOTE — Consult Note (Signed)
NAME:  Sylvia [name], MRN:  [id number], DOB:  [birth year], LOS: 0 ADMISSION DATE:  10/13/2021, CONSULTATION DATE: 10/13/21 REFERRING MD: Dr. Delana Meyer, CHIEF COMPLAINT: Ischemic Left Leg    History of Present Illness:  66 year old female with hx of DM, PVD, chronic pain related to both and prior RLE BKA.  She presented to [facility] on 06/27 for left lower extremity angiogram with angioplasty and TPA to infuse overnight in the ICU with return to OR on 06/28.  PCCM team consulted to assist with management.   Pertinent  Medical History  DM2- last A1c 8.7% Sept 2022 COPD no PFTs on file PVD GERD Anxiety RA on MTX/plaquenil, pred chronically as well as TNF blocker followed by [facility] Events: Including procedures, antibiotic start and stop dates in addition to other pertinent events   06/27: Pt admitted to ICU post left lower extremity angiogram w/ angioplasty and TPA administration overnight. Per vascular surgery plan to return to the OR on 06/28.  PCCM team consulted to assist with management   Interim History / Subjective:  Pt resting comfortably in bed, no signs of distress   Objective   Blood pressure (!) 161/87, pulse 87, temperature (!) 97.5 F (36.4 C), temperature source Oral, resp. rate 12, height 5\' 7"  (1.702 m), weight 63.5 kg, SpO2 98 %.       No intake or output data in the 24 hours ending 10/13/21 1825 Filed Weights   10/13/21 1047 10/13/21 1720  Weight: 64.4 kg 63.5 kg    Examination: General: Well developed, well nourished female, NAD resting in bed  HENT: Supple, no JVD  Lungs: Clear throughout, even, non labored  Cardiovascular: NSR, rrr, no r/g, 2+ radial/2+ right popliteal pulse via doppler/1+ posterior tibial pulse via doppler, draining yellow urine Abdomen: +BS x4, soft, non tender, non distended  Extremities: Right BKA; LLE cool to touch  Neuro: Lethargic, oriented, follows commands, PERRLA  GU: Voiding via urinal   Resolved  Hospital Problem list     Assessment & Plan:   Angiogram w/angioplasty with overnight TPA and heparin administration  - Trend CBC, fibrinogen levels, and heparin levels  - Monitor for s/sx of bleeding - Transfuse for hgb <7   Postop pain s/p angiogram w/ angioplasty 06/27 - Continue outpatient gabapentin  - Prn dilaudid and percocet for pain management  Hypertension  - Continuous telemetry monitoring  - Continue scheduled lisinopril; prn hydralazine and labetalol for bp management   COPD  - Prn supplemental O2 for dyspnea and/or hypoxia  - Prn bronchodilator therapy   Type II diabetes mellitus  - CBG's ac/hs - SSI    Will follow at least through the night to assure pain control. If pt stable 06/28 PCCM team will sign off  Best Practice (right click and "Reselect all SmartList Selections" daily)   Diet/type: clear liquids DVT prophylaxis: systemic heparin GI prophylaxis: N/A Lines: Right femoral CVL and still needed  Foley:  Yes, and it is still needed Code Status:  full code Last date of multidisciplinary goals of care discussion [N/A]  Labs   CBC: No results for input(s): "WBC", "NEUTROABS", "HGB", "HCT", "MCV", "PLT" in the last 168 hours.  Basic Metabolic Panel: Recent Labs  Lab 10/13/21 1058  BUN 17  CREATININE 0.88   GFR: Estimated Creatinine Clearance: 61.2 mL/min (by C-G formula based on SCr of 0.88 mg/dL). No results for input(s): "PROCALCITON", "WBC", "LATICACIDVEN" in the last 168 hours.  Liver Function Tests: No results  for input(s): "AST", "ALT", "ALKPHOS", "BILITOT", "PROT", "ALBUMIN" in the last 168 hours. No results for input(s): "LIPASE", "AMYLASE" in the last 168 hours. No results for input(s): "AMMONIA" in the last 168 hours.  ABG No results found for: "PHART", "PCO2ART", "PO2ART", "HCO3", "TCO2", "ACIDBASEDEF", "O2SAT"   Coagulation Profile: No results for input(s): "INR", "PROTIME" in the last 168 hours.  Cardiac Enzymes: No  results for input(s): "CKTOTAL", "CKMB", "CKMBINDEX", "TROPONINI" in the last 168 hours.  HbA1C: Hgb A1c MFr Bld  Date/Time Value Ref Range Status  09/22/2021 10:55 AM 8.2 (H) 4.8 - 5.6 % Final    Comment:    REPEATED TO VERIFY (NOTE) Pre diabetes:          5.7%-6.4%  Diabetes:              >6.4%  Glycemic control for   <7.0% adults with diabetes   09/21/2021 06:11 PM 8.3 (H) 4.8 - 5.6 % Final    Comment:    (NOTE) Pre diabetes:          5.7%-6.4%  Diabetes:              >6.4%  Glycemic control for   <7.0% adults with diabetes     CBG: Recent Labs  Lab 10/13/21 1101 10/13/21 1715  GLUCAP 255* 258*    Review of Systems: Positives in BOLD   Gen: left lower extremity postop pain fever, chills, weight change, fatigue, night sweats HEENT: Denies blurred vision, double vision, hearing loss, tinnitus, sinus congestion, rhinorrhea, sore throat, neck stiffness, dysphagia PULM: Denies shortness of breath, cough, sputum production, hemoptysis, wheezing CV: Denies chest pain, edema, orthopnea, paroxysmal nocturnal dyspnea, palpitations GI: Denies abdominal pain, nausea, vomiting, diarrhea, hematochezia, melena, constipation, change in bowel habits GU: Denies dysuria, hematuria, polyuria, oliguria, urethral discharge Endocrine: Denies hot or cold intolerance, polyuria, polyphagia or appetite change Derm: Denies rash, dry skin, scaling or peeling skin change Heme: Denies easy bruising, bleeding, bleeding gums Neuro: Denies headache, numbness, weakness, slurred speech, loss of memory or consciousness   Past Medical History:  She,  has a past medical history of Anemia of chronic disease (05/16/2019), Anxiety, COPD (chronic obstructive pulmonary disease) (HCC), Diabetes mellitus without complication (HCC), GERD (gastroesophageal reflux disease), and Hypertension.   Surgical History:   Past Surgical History:  Procedure Laterality Date   ABDOMINAL HYSTERECTOMY     AMPUTATION Right  12/27/2018   Procedure: AMPUTATION BELOW KNEE;  Surgeon: Annice Needy, MD;  Location: [facility];  Service: General;  Laterality: Right;   CATARACT EXTRACTION W/PHACO Right 03/27/2020   Procedure: CATARACT EXTRACTION PHACO AND INTRAOCULAR LENS PLACEMENT (IOC) RIGHT DIABETIC 7.54 00:52.5;  Surgeon: Galen Manila, MD;  Location: [facility];  Service: Ophthalmology;  Laterality: Right;   CATARACT EXTRACTION W/PHACO Left 05/20/2020   Procedure: CATARACT EXTRACTION PHACO AND INTRAOCULAR LENS PLACEMENT (IOC) LEFT DIABETIC 4.95 00:37.6;  Surgeon: Galen Manila, MD;  Location: [facility];  Service: Ophthalmology;  Laterality: Left;  Diabetic - oral meds COVID + 04-24-20   CHOLECYSTECTOMY     COLONOSCOPY WITH PROPOFOL N/A 04/16/2019   Procedure: COLONOSCOPY WITH PROPOFOL;  Surgeon: Pasty Spillers, MD;  Location: [facility];  Service: Endoscopy;  Laterality: N/A;   COLONOSCOPY WITH PROPOFOL N/A 01/16/2020   Procedure: COLONOSCOPY WITH PROPOFOL;  Surgeon: Pasty Spillers, MD;  Location: [facility];  Service: Endoscopy;  Laterality: N/A;   ESOPHAGOGASTRODUODENOSCOPY (EGD) WITH PROPOFOL N/A 06/15/2018   Procedure: ESOPHAGOGASTRODUODENOSCOPY (EGD) WITH PROPOFOL;  Surgeon: Scissors, Webster,  MD;  Location: [facility];  Service: Gastroenterology;  Laterality: N/A;   ESOPHAGOGASTRODUODENOSCOPY (EGD) WITH PROPOFOL N/A 01/03/2019   Procedure: ESOPHAGOGASTRODUODENOSCOPY (EGD) WITH PROPOFOL;  Surgeon: Lucilla Lame, MD;  Location: [facility];  Service: Endoscopy;  Laterality: N/A;   ESOPHAGOGASTRODUODENOSCOPY (EGD) WITH PROPOFOL N/A 09/25/2021   Procedure: ESOPHAGOGASTRODUODENOSCOPY (EGD) WITH PROPOFOL;  Surgeon: Lucilla Lame, MD;  Location: [facility];  Service: Endoscopy;  Laterality: N/A;   GIVENS CAPSULE STUDY  04/16/2019   Procedure: GIVENS CAPSULE STUDY;  Surgeon: Virgel Manifold, MD;  Location: [facility];  Service: Endoscopy;;   GIVENS CAPSULE STUDY N/A  06/25/2019   Procedure: GIVENS CAPSULE STUDY;  Surgeon: Jonathon Bellows, MD;  Location: [facility];  Service: Gastroenterology;  Laterality: N/A;   LOWER EXTREMITY ANGIOGRAPHY Right 08/21/2018   Procedure: LOWER EXTREMITY ANGIOGRAPHY;  Surgeon: Algernon Huxley, MD;  Location: [place] [facility];  Service: Cardiovascular;  Laterality: Right;   LOWER EXTREMITY ANGIOGRAPHY Left 08/28/2018   Procedure: LOWER EXTREMITY ANGIOGRAPHY;  Surgeon: Algernon Huxley, MD;  Location: [place] [facility];  Service: Cardiovascular;  Laterality: Left;   LOWER EXTREMITY ANGIOGRAPHY Right 08/28/2018   Procedure: Lower Extremity Angiography;  Surgeon: Algernon Huxley, MD;  Location: [place] [facility];  Service: Cardiovascular;  Laterality: Right;   LOWER EXTREMITY ANGIOGRAPHY Right 12/18/2018   Procedure: Lower Extremity Angiography;  Surgeon: Algernon Huxley, MD;  Location: [place] [facility];  Service: Cardiovascular;  Laterality: Right;   LOWER EXTREMITY ANGIOGRAPHY Right 12/18/2018   Procedure: Lower Extremity Angiography;  Surgeon: Algernon Huxley, MD;  Location: [place] [facility];  Service: Cardiovascular;  Laterality: Right;   LOWER EXTREMITY ANGIOGRAPHY Left 12/21/2018   Procedure: Lower Extremity Angiography;  Surgeon: Algernon Huxley, MD;  Location: [place] [facility];  Service: Cardiovascular;  Laterality: Left;   LOWER EXTREMITY ANGIOGRAPHY Right 12/21/2018   Procedure: Lower Extremity Angiography;  Surgeon: Algernon Huxley, MD;  Location: [place] [facility];  Service: Cardiovascular;  Laterality: Right;   LOWER EXTREMITY ANGIOGRAPHY Left 12/25/2020   Procedure: LOWER EXTREMITY ANGIOGRAPHY;  Surgeon: Algernon Huxley, MD;  Location: [place] [facility];  Service: Cardiovascular;  Laterality: Left;   LOWER EXTREMITY ANGIOGRAPHY Left 09/21/2021   Procedure: Lower Extremity Angiography;  Surgeon: Algernon Huxley, MD;  Location: [place] [facility];  Service: Cardiovascular;  Laterality: Left;   LOWER EXTREMITY INTERVENTION N/A 12/22/2018    Procedure: LOWER EXTREMITY INTERVENTION;  Surgeon: Algernon Huxley, MD;  Location: [place] [facility];  Service: Cardiovascular;  Laterality: N/A;   LOWER EXTREMITY INTERVENTION Left 12/26/2020   Procedure: LOWER EXTREMITY INTERVENTION;  Surgeon: Algernon Huxley, MD;  Location: [place] [facility];  Service: Cardiovascular;  Laterality: Left;   LOWER EXTREMITY INTERVENTION N/A 09/22/2021   Procedure: LOWER EXTREMITY INTERVENTION;  Surgeon: Algernon Huxley, MD;  Location: [place] [facility];  Service: Cardiovascular;  Laterality: N/A;     Social History:   reports that she has quit smoking. Her smoking use included cigarettes. She has a 11.25 pack-year smoking history. She has never used smokeless tobacco. She reports that she does not currently use drugs. She reports that she does not drink alcohol.   Family History:  Her family history includes Breast cancer (age of onset: 72) in her mother.   Allergies Allergies  Allergen Reactions   Vancomycin Rash    Patient developed a rash to injection site and arm a few mintes after starting ABX.    Hydrocodone Rash  Metformin And Related Diarrhea   Penicillins Hives    Has patient had a PCN reaction causing immediate rash, facial/tongue/throat swelling, SOB or lightheadedness with hypotension: Yes Has patient had a PCN reaction causing severe rash involving mucus membranes or skin necrosis: No Has patient had a PCN reaction that required hospitalization: No Has patient had a PCN reaction occurring within the last 10 years: No If all of the above answers are "NO", then may proceed with Cephalosporin use.   Tramadol Itching     Home Medications  Prior to Admission medications   Medication Sig Start Date End Date Taking? Authorizing Provider  acetaminophen (TYLENOL) 325 MG tablet Take 650 mg by mouth every 6 (six) hours as needed.   Yes [provider]  albuterol (PROVENTIL HFA;VENTOLIN HFA) 108 (90 Base) MCG/ACT inhaler Inhale 2 puffs into  the lungs every 6 (six) hours as needed for wheezing or shortness of breath. 08/20/17  Yes Jeanmarie Plant, MD  cetirizine (ZYRTEC) 10 MG tablet Take 10 mg by mouth daily.   Yes [provider]  docusate sodium (COLACE) 100 MG capsule Take 100 mg by mouth 2 (two) times daily.   Yes [provider]  famotidine (PEPCID) 20 MG tablet Take 1 tablet (20 mg total) by mouth at bedtime. 09/28/21 01/26/22 Yes Georgiana Spinner, NP  ferrous sulfate 325 (65 FE) MG tablet Take 325 mg by mouth daily.    Yes [provider]  folic acid (FOLVITE) 1 MG tablet Take 1 mg by mouth daily. 08/13/20  Yes [provider]  gabapentin (NEURONTIN) 300 MG capsule TAKE 2 CAPSULES BY MOUTH IN THE  MORNING AND 3 CAPSULES BY MOUTH  IN THE EVENING 04/21/21  Yes Georgiana Spinner, NP  JARDIANCE 10 MG TABS tablet Take 10 mg by mouth daily. 05/08/20  Yes [provider]  lisinopril (ZESTRIL) 10 MG tablet Take 1 tablet (10 mg total) by mouth daily. 07/26/19  Yes Arnetha Courser, MD  Multiple Vitamins-Minerals (WOMENS MULTIVITAMIN PO) Take 1 tablet by mouth daily.   Yes [provider]  oxyCODONE-acetaminophen (PERCOCET/ROXICET) 5-325 MG tablet Take 1 tablet by mouth every 6 (six) hours as needed for moderate pain. 09/28/21  Yes Georgiana Spinner, NP  predniSONE (DELTASONE) 5 MG tablet Take 5 mg by mouth daily. 06/24/21  Yes [provider]  sitaGLIPtin (JANUVIA) 100 MG tablet Take 100 mg by mouth daily.   Yes [provider]  VICTOZA 18 MG/3ML SOPN Inject into the skin. 09/04/21  Yes [provider]  vitamin B-12 (CYANOCOBALAMIN) 500 MCG tablet Take 1 tablet (500 mcg total) by mouth daily. 11/26/19  Yes Rickard Patience, MD  aspirin 81 MG chewable tablet Chew 1 tablet (81 mg total) by mouth daily. 09/29/21   Georgiana Spinner, NP  atorvastatin (LIPITOR) 10 MG tablet Take 10 mg by mouth daily. 09/04/21   [provider]  buPROPion (ZYBAN) 150 MG 12 hr tablet Take 150 mg by mouth 2  (two) times daily. 08/24/21   [provider]  clarithromycin (BIAXIN) 500 MG tablet Take 1 tablet (500 mg total) by mouth 2 (two) times daily. Patient not taking: Reported on 10/13/2021 10/09/21   Midge Minium, MD  clopidogrel (PLAVIX) 75 MG tablet Take 1 tablet (75 mg total) by mouth daily. 12/31/20   Stegmayer, Ranae Plumber, PA-C  DULoxetine (CYMBALTA) 30 MG capsule Take 30 mg by mouth daily.    [provider]  ELIQUIS 2.5 MG TABS tablet TAKE 1 TABLET  BY MOUTH  TWICE DAILY 03/27/21   Georgiana Spinner, NP  hydroxychloroquine (PLAQUENIL) 200 MG tablet Take 200 mg by mouth daily. 01/22/21   [provider]  Melatonin 1 MG TABS Take 1 tablet by mouth at bedtime. Patient not taking: Reported on 10/13/2021    [provider]  methotrexate (RHEUMATREX) 2.5 MG tablet Take 15 mg by mouth once a week. Patient not taking: Reported on 09/17/2021 10/13/20   [provider]  metroNIDAZOLE (FLAGYL) 500 MG tablet Take 1 tablet (500 mg total) by mouth 3 (three) times daily. Patient not taking: Reported on 10/13/2021 10/09/21   Midge Minium, MD  pantoprazole (PROTONIX) 20 MG tablet Take 1 tablet (20 mg total) by mouth 2 (two) times daily before a meal. 10/09/21   Midge Minium, MD  pantoprazole (PROTONIX) 40 MG tablet Take 1 tablet (40 mg total) by mouth daily. 09/29/21   Georgiana Spinner, NP     Critical care time: 40 minutes    Zada Girt, AGNP  Pulmonary/Critical Care Pager [phone] (please enter 7 digits) PCCM Consult Pager [phone] (please enter 7 digits)

## 2021-10-13 NOTE — Progress Notes (Signed)
               MRN : [id number]  Sylvia [name] is a 65 y.o. (03/25/1956) female who presents with chief complaint of check circulation.  History of Present Illness:  The patient returns to the office for followup and review of the noninvasive studies.   The patient notes that there has been a significant deterioration in the lower extremity symptoms.  The patient notes interval shortening of their claudication distance and development of mild rest pain symptoms. No new ulcers or wounds have occurred since the last visit.  There have been no significant changes to the patient's overall health care.  The patient denies amaurosis fugax or recent TIA symptoms. There are no recent neurological changes noted. There is no history of DVT, PE or superficial thrombophlebitis. The patient denies recent episodes of angina or shortness of breath.   Duplex US of the lower extremity arterial system shows occlusion of the previous intervention    Current Meds  Medication Sig   acetaminophen (TYLENOL) 325 MG tablet Take 650 mg by mouth every 6 (six) hours as needed.   albuterol (PROVENTIL HFA;VENTOLIN HFA) 108 (90 Base) MCG/ACT inhaler Inhale 2 puffs into the lungs every 6 (six) hours as needed for wheezing or shortness of breath.   cetirizine (ZYRTEC) 10 MG tablet Take 10 mg by mouth daily.   docusate sodium (COLACE) 100 MG capsule Take 100 mg by mouth 2 (two) times daily.   famotidine (PEPCID) 20 MG tablet Take 1 tablet (20 mg total) by mouth at bedtime.   ferrous sulfate 325 (65 FE) MG tablet Take 325 mg by mouth daily.    folic acid (FOLVITE) 1 MG tablet Take 1 mg by mouth daily.   gabapentin (NEURONTIN) 300 MG capsule TAKE 2 CAPSULES BY MOUTH IN THE  MORNING AND 3 CAPSULES BY MOUTH  IN THE EVENING   JARDIANCE 10 MG TABS tablet Take 10 mg by mouth daily.   lisinopril (ZESTRIL) 10 MG tablet Take 1 tablet (10 mg total) by mouth daily.   Multiple Vitamins-Minerals (WOMENS MULTIVITAMIN PO) Take  1 tablet by mouth daily.   oxyCODONE-acetaminophen (PERCOCET/ROXICET) 5-325 MG tablet Take 1 tablet by mouth every 6 (six) hours as needed for moderate pain.   predniSONE (DELTASONE) 5 MG tablet Take 5 mg by mouth daily.   sitaGLIPtin (JANUVIA) 100 MG tablet Take 100 mg by mouth daily.   VICTOZA 18 MG/3ML SOPN Inject into the skin.   vitamin B-12 (CYANOCOBALAMIN) 500 MCG tablet Take 1 tablet (500 mcg total) by mouth daily.    Past Medical History:  Diagnosis Date   Anemia of chronic disease 05/16/2019   Anxiety    h/o   COPD (chronic obstructive pulmonary disease) (HCC)    Diabetes mellitus without complication (HCC)    GERD (gastroesophageal reflux disease)    Hypertension    bp under control-off meds since 2019    Past Surgical History:  Procedure Laterality Date   ABDOMINAL HYSTERECTOMY     AMPUTATION Right 12/27/2018   Procedure: AMPUTATION BELOW KNEE;  Surgeon: Dew, Jason S, MD;  Location: [facility];  Service: General;  Laterality: Right;   CATARACT EXTRACTION W/PHACO Right 03/27/2020   Procedure: CATARACT EXTRACTION PHACO AND INTRAOCULAR LENS PLACEMENT (IOC) RIGHT DIABETIC 7.54 00:52.5;  Surgeon: Porfilio, William, MD;  Location: [facility];  Service: Ophthalmology;  Laterality: Right;   CATARACT EXTRACTION W/PHACO Left 05/20/2020   Procedure: CATARACT EXTRACTION PHACO AND INTRAOCULAR LENS PLACEMENT (IOC) LEFT   DIABETIC 4.95 00:37.6;  Surgeon: Porfilio, William, MD;  Location: [facility];  Service: Ophthalmology;  Laterality: Left;  Diabetic - oral meds COVID + 04-24-20   CHOLECYSTECTOMY     COLONOSCOPY WITH PROPOFOL N/A 04/16/2019   Procedure: COLONOSCOPY WITH PROPOFOL;  Surgeon: Tahiliani, Varnita B, MD;  Location: [facility];  Service: Endoscopy;  Laterality: N/A;   COLONOSCOPY WITH PROPOFOL N/A 01/16/2020   Procedure: COLONOSCOPY WITH PROPOFOL;  Surgeon: Tahiliani, Varnita B, MD;  Location: [facility];  Service: Endoscopy;  Laterality: N/A;    ESOPHAGOGASTRODUODENOSCOPY (EGD) WITH PROPOFOL N/A 06/15/2018   Procedure: ESOPHAGOGASTRODUODENOSCOPY (EGD) WITH PROPOFOL;  Surgeon: Toledo, Teodoro K, MD;  Location: [facility];  Service: Gastroenterology;  Laterality: N/A;   ESOPHAGOGASTRODUODENOSCOPY (EGD) WITH PROPOFOL N/A 01/03/2019   Procedure: ESOPHAGOGASTRODUODENOSCOPY (EGD) WITH PROPOFOL;  Surgeon: Wohl, Darren, MD;  Location: [facility];  Service: Endoscopy;  Laterality: N/A;   ESOPHAGOGASTRODUODENOSCOPY (EGD) WITH PROPOFOL N/A 09/25/2021   Procedure: ESOPHAGOGASTRODUODENOSCOPY (EGD) WITH PROPOFOL;  Surgeon: Wohl, Darren, MD;  Location: [facility];  Service: Endoscopy;  Laterality: N/A;   GIVENS CAPSULE STUDY  04/16/2019   Procedure: GIVENS CAPSULE STUDY;  Surgeon: Tahiliani, Varnita B, MD;  Location: [facility];  Service: Endoscopy;;   GIVENS CAPSULE STUDY N/A 06/25/2019   Procedure: GIVENS CAPSULE STUDY;  Surgeon: Anna, Kiran, MD;  Location: [facility];  Service: Gastroenterology;  Laterality: N/A;   LOWER EXTREMITY ANGIOGRAPHY Right 08/21/2018   Procedure: LOWER EXTREMITY ANGIOGRAPHY;  Surgeon: Dew, Jason S, MD;  Location: [facility];  Service: Cardiovascular;  Laterality: Right;   LOWER EXTREMITY ANGIOGRAPHY Left 08/28/2018   Procedure: LOWER EXTREMITY ANGIOGRAPHY;  Surgeon: Dew, Jason S, MD;  Location: [facility];  Service: Cardiovascular;  Laterality: Left;   LOWER EXTREMITY ANGIOGRAPHY Right 08/28/2018   Procedure: Lower Extremity Angiography;  Surgeon: Dew, Jason S, MD;  Location: [facility];  Service: Cardiovascular;  Laterality: Right;   LOWER EXTREMITY ANGIOGRAPHY Right 12/18/2018   Procedure: Lower Extremity Angiography;  Surgeon: Dew, Jason S, MD;  Location: [facility];  Service: Cardiovascular;  Laterality: Right;   LOWER EXTREMITY ANGIOGRAPHY Right 12/18/2018   Procedure: Lower Extremity Angiography;  Surgeon: Dew, Jason S, MD;  Location: [facility];  Service:  Cardiovascular;  Laterality: Right;   LOWER EXTREMITY ANGIOGRAPHY Left 12/21/2018   Procedure: Lower Extremity Angiography;  Surgeon: Dew, Jason S, MD;  Location: [facility];  Service: Cardiovascular;  Laterality: Left;   LOWER EXTREMITY ANGIOGRAPHY Right 12/21/2018   Procedure: Lower Extremity Angiography;  Surgeon: Dew, Jason S, MD;  Location: [facility];  Service: Cardiovascular;  Laterality: Right;   LOWER EXTREMITY ANGIOGRAPHY Left 12/25/2020   Procedure: LOWER EXTREMITY ANGIOGRAPHY;  Surgeon: Dew, Jason S, MD;  Location: [facility];  Service: Cardiovascular;  Laterality: Left;   LOWER EXTREMITY ANGIOGRAPHY Left 09/21/2021   Procedure: Lower Extremity Angiography;  Surgeon: Dew, Jason S, MD;  Location: [facility];  Service: Cardiovascular;  Laterality: Left;   LOWER EXTREMITY INTERVENTION N/A 12/22/2018   Procedure: LOWER EXTREMITY INTERVENTION;  Surgeon: Dew, Jason S, MD;  Location: [facility];  Service: Cardiovascular;  Laterality: N/A;   LOWER EXTREMITY INTERVENTION Left 12/26/2020   Procedure: LOWER EXTREMITY INTERVENTION;  Surgeon: Dew, Jason S, MD;  Location: [facility];  Service: Cardiovascular;  Laterality: Left;   LOWER EXTREMITY INTERVENTION N/A 09/22/2021   Procedure: LOWER EXTREMITY INTERVENTION;  Surgeon: Dew, Jason S, MD;  Location: [facility];  Service: Cardiovascular;  Laterality: N/A;    Social History Social History   Tobacco Use   Smoking status: Former    Packs/day: 0.25    Years: 45.00    Total pack years: 11.25    Types: Cigarettes   Smokeless tobacco: Never   Tobacco comments:    quit  Vaping Use   Vaping Use: Never used  Substance Use Topics   Alcohol use: No   Drug use: Not Currently    Comment: last used in April 2021 per patient    Family History Family History  Problem Relation Age of Onset   Breast cancer Mother 80    Allergies  Allergen Reactions   Vancomycin Rash    Patient  developed a rash to injection site and arm a few mintes after starting ABX.    Hydrocodone Rash   Metformin And Related Diarrhea   Penicillins Hives    Has patient had a PCN reaction causing immediate rash, facial/tongue/throat swelling, SOB or lightheadedness with hypotension: Yes Has patient had a PCN reaction causing severe rash involving mucus membranes or skin necrosis: No Has patient had a PCN reaction that required hospitalization: No Has patient had a PCN reaction occurring within the last 10 years: No If all of the above answers are "NO", then may proceed with Cephalosporin use.   Tramadol Itching     REVIEW OF SYSTEMS (Negative unless checked)  Constitutional: [] Weight loss  [] Fever  [] Chills Cardiac: [] Chest pain   [] Chest pressure   [] Palpitations   [] Shortness of breath when laying flat   [] Shortness of breath with exertion. Vascular:  [x] Pain in legs with walking   [] Pain in legs at rest  [] History of DVT   [] Phlebitis   [] Swelling in legs   [] Varicose veins   [] Non-healing ulcers Pulmonary:   [] Uses home oxygen   [] Productive cough   [] Hemoptysis   [] Wheeze  [] COPD   [] Asthma Neurologic:  [] Dizziness   [] Seizures   [] History of stroke   [] History of TIA  [] Aphasia   [] Vissual changes   [] Weakness or numbness in arm   [] Weakness or numbness in leg Musculoskeletal:   [] Joint swelling   [] Joint pain   [] Low back pain Hematologic:  [] Easy bruising  [] Easy bleeding   [] Hypercoagulable state   [] Anemic Gastrointestinal:  [] Diarrhea   [] Vomiting  [] Gastroesophageal reflux/heartburn   [] Difficulty swallowing. Genitourinary:  [] Chronic kidney disease   [] Difficult urination  [] Frequent urination   [] Blood in urine Skin:  [] Rashes   [] Ulcers  Psychological:  [] History of anxiety   []  History of major depression.  Physical Examination  Vitals:   10/13/21 1047  BP: 134/75  Pulse: 100  Resp: 18  Temp: 97.8 F (36.6 C)  TempSrc: Oral  SpO2: 98%  Weight: 64.4 kg  Height: 5\' 7"   (1.702 m)   Body mass index is 22.24 kg/m. Gen: WD/WN, NAD Head: [place]/AT, No temporalis wasting.  Ear/Nose/Throat: Hearing grossly intact, nares w/o erythema or drainage Eyes: PER, EOMI, sclera nonicteric.  Neck: Supple, no masses.  No bruit or JVD.  Pulmonary:  Good air movement, no audible wheezing, no use of accessory muscles.  Cardiac: RRR, normal S1, S2, no Murmurs. Vascular:  mild trophic changes, no open wounds Vessel Right Left  Radial Palpable Palpable  PT Not Palpable Not Palpable  DP Not Palpable Not Palpable  Gastrointestinal: soft, non-distended. No guarding/no peritoneal signs.  Musculoskeletal: M/S 5/5 throughout.  No visible deformity.  Neurologic: CN 2-12 intact.  Pain and light touch intact in extremities.  Symmetrical.  Speech is fluent. Motor exam as listed above. Psychiatric: Judgment intact, Mood & affect appropriate for pt's clinical situation. Dermatologic: No rashes or ulcers noted.  No changes consistent with cellulitis.   CBC Lab Results  Component Value Date   WBC 11.7 (H) 09/27/2021   HGB 9.8 (L) 09/27/2021   HCT 29.2 (L) 09/27/2021   MCV 85.1 09/27/2021   PLT 346 09/27/2021    BMET    Component Value Date/Time   NA 142 09/27/2021 1034   K 4.2 09/27/2021 1034   CL 110 09/27/2021 1034   CO2 25 09/27/2021 1034   GLUCOSE 162 (H) 09/27/2021 1034   BUN 17 10/13/2021 1058   CREATININE 0.88 10/13/2021 1058   CALCIUM 9.4 09/27/2021 1034   GFRNONAA >60 10/13/2021 1058   GFRAA >60 11/23/2019 1505   Estimated Creatinine Clearance: 61.2 mL/min (by C-G formula based on SCr of 0.88 mg/dL).  COAG Lab Results  Component Value Date   INR 1.3 (H) 09/21/2021   INR 1.1 12/25/2020   INR 1.0 07/25/2019    Radiology NM GI Blood Loss  Result Date: 09/25/2021 CLINICAL DATA:  Concern for gastrointestinal bleed. EXAM: NUCLEAR MEDICINE GASTROINTESTINAL BLEEDING SCAN TECHNIQUE: Sequential abdominal images were obtained following intravenous administration of  Tc-99m labeled red blood cells. RADIOPHARMACEUTICALS:  20.73 mCi Tc-99m pertechnetate in-vitro labeled red cells. COMPARISON:  CT April 24, 2020. FINDINGS: No abnormal areas of radiotracer uptake in the abdomen or pelvis to suggest acute gastrointestinal bleed. IMPRESSION: No scintigraphic evidence of acute gastrointestinal hemorrhage. Electronically Signed   By: Jeffrey  Waltz M.D.   On: 09/25/2021 14:24   PERIPHERAL VASCULAR CATHETERIZATION  Result Date: 09/22/2021 See surgical note for result.  PERIPHERAL VASCULAR CATHETERIZATION  Result Date: 09/21/2021 See surgical note for result.  VAS US ABI WITH/WO TBI  Result Date: 09/18/2021  LOWER EXTREMITY DOPPLER STUDY Patient Name:  Sylvia [name]  Date of Exam:   09/17/2021 Medical Rec #: [id number]           Accession #:    [id number] Date of Birth: 06/08/1955            Patient Gender: F Patient Age:   65 years Exam Location:  [facility] Procedure:      VAS US ABI WITH/WO TBI Referring Phys: Jason Dew --------------------------------------------------------------------------------  Indications: Peripheral artery disease, and right BKA.  Vascular Interventions: 08/21/18: Right SFA/popliteal artery PTA/stent;                         08/28/18: Left EIA & SFA/popliteal artery stents;                         08/28/18: Right popliteal, TP trunk & peroneal artery                         thrombectomies/PTAs;                         12/27/18: Right BKA;                         12/25/20: Left SFA/popliteal thrombectomy/PTA;                         12/26/20: Left proximal SFA thombectomy/stent;. Comparison Study: 07/21/2021 Performing Technologist: Solomon   Mcclary RVS  Examination Guidelines: A complete evaluation includes at minimum, Doppler waveform signals and systolic blood pressure reading at the level of bilateral brachial, anterior tibial, and posterior tibial arteries, when vessel segments are accessible. Bilateral testing is considered an integral part of  a complete examination. Photoelectric Plethysmograph (PPG) waveforms and toe systolic pressure readings are included as required and additional duplex testing as needed. Limited examinations for reoccurring indications may be performed as noted.  ABI Findings: +--------+------------------+-----+--------+--------+ Right   Rt Pressure (mmHg)IndexWaveformComment  +--------+------------------+-----+--------+--------+ [id number]                                     +--------+------------------+-----+--------+--------+ ATA                                    Rt BKA   +--------+------------------+-----+--------+--------+ +---------+------------------+-----+----------+-------+ Left     Lt Pressure (mmHg)IndexWaveform  Comment +---------+------------------+-----+----------+-------+ Brachial 154                                      +---------+------------------+-----+----------+-------+ ATA      73                0.47 monophasic        +---------+------------------+-----+----------+-------+ PTA      67                0.44 monophasic        +---------+------------------+-----+----------+-------+ Great Toe39                0.25 Abnormal          +---------+------------------+-----+----------+-------+ +-------+-----------+-----------+------------+------------+ ABI/TBIToday's ABIToday's TBIPrevious ABIPrevious TBI +-------+-----------+-----------+------------+------------+ Right  Rt BKA                RT BKA                   +-------+-----------+-----------+------------+------------+ Left   .47        .25        .93         .96          +-------+-----------+-----------+------------+------------+ Left ABIs and TBIs appear decreased compared to prior study on 07/21/2021.  Summary: Right: Rt BKA. Left: Resting left ankle-brachial index indicates severe left lower extremity arterial disease. The left toe-brachial index is abnormal.  *See table(s) above for measurements  and observations.  Electronically signed by [name] MD on 09/18/2021 at 11:42:58 AM.    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  This represents a limb threatening ischemia and places the patient at the risk for left limb loss.  Patient should undergo angiography of the left lower extremity with the hope for intervention for limb salvage.  The risks and benefits as well as the alternative therapies was discussed in detail with the patient.  All questions were answered.  Patient agrees to proceed with left lower extremity angiography.  The patient will follow up with me in the office after the procedure.    2.  COPD:  Continue pulmonary medications and aerosols as already ordered, these medications have been reviewed and there are no changes at this time.  3.  Hypertension:   Patient will continue medical management; nephrology is following no changes in oral medications. 4. Diabetes mellitus:   Glucose will be monitored and oral medications been held this morning once the patient has undergone the patient's procedure po intake will be reinitiated and again Accu-Cheks will be used to assess the blood glucose level and treat as needed. The patient will be restarted on the patient's usual hypoglycemic regime      Shenequa Howse, MD  10/13/2021 1:19 PM    

## 2021-10-13 NOTE — H&P (View-Only) (Signed)
MRN : [id number]  Sylvia [name] is a 66 y.o. (Nov 26, 1955) female who presents with chief complaint of check circulation.  History of Present Illness:  The patient returns to the office for followup and review of the noninvasive studies.   The patient notes that there has been a significant deterioration in the lower extremity symptoms.  The patient notes interval shortening of their claudication distance and development of mild rest pain symptoms. No new ulcers or wounds have occurred since the last visit.  There have been no significant changes to the patient's overall health care.  The patient denies amaurosis fugax or recent TIA symptoms. There are no recent neurological changes noted. There is no history of DVT, PE or superficial thrombophlebitis. The patient denies recent episodes of angina or shortness of breath.   Duplex US of the lower extremity arterial system shows occlusion of the previous intervention    Current Meds  Medication Sig   acetaminophen (TYLENOL) 325 MG tablet Take 650 mg by mouth every 6 (six) hours as needed.   albuterol (PROVENTIL HFA;VENTOLIN HFA) 108 (90 Base) MCG/ACT inhaler Inhale 2 puffs into the lungs every 6 (six) hours as needed for wheezing or shortness of breath.   cetirizine (ZYRTEC) 10 MG tablet Take 10 mg by mouth daily.   docusate sodium (COLACE) 100 MG capsule Take 100 mg by mouth 2 (two) times daily.   famotidine (PEPCID) 20 MG tablet Take 1 tablet (20 mg total) by mouth at bedtime.   ferrous sulfate 325 (65 FE) MG tablet Take 325 mg by mouth daily.    folic acid (FOLVITE) 1 MG tablet Take 1 mg by mouth daily.   gabapentin (NEURONTIN) 300 MG capsule TAKE 2 CAPSULES BY MOUTH IN THE  MORNING AND 3 CAPSULES BY MOUTH  IN THE EVENING   JARDIANCE 10 MG TABS tablet Take 10 mg by mouth daily.   lisinopril (ZESTRIL) 10 MG tablet Take 1 tablet (10 mg total) by mouth daily.   Multiple Vitamins-Minerals (WOMENS MULTIVITAMIN PO) Take  1 tablet by mouth daily.   oxyCODONE-acetaminophen (PERCOCET/ROXICET) 5-325 MG tablet Take 1 tablet by mouth every 6 (six) hours as needed for moderate pain.   predniSONE (DELTASONE) 5 MG tablet Take 5 mg by mouth daily.   sitaGLIPtin (JANUVIA) 100 MG tablet Take 100 mg by mouth daily.   VICTOZA 18 MG/3ML SOPN Inject into the skin.   vitamin B-12 (CYANOCOBALAMIN) 500 MCG tablet Take 1 tablet (500 mcg total) by mouth daily.    Past Medical History:  Diagnosis Date   Anemia of chronic disease 05/16/2019   Anxiety    h/o   COPD (chronic obstructive pulmonary disease) (HCC)    Diabetes mellitus without complication (HCC)    GERD (gastroesophageal reflux disease)    Hypertension    bp under control-off meds since 2019    Past Surgical History:  Procedure Laterality Date   ABDOMINAL HYSTERECTOMY     AMPUTATION Right 12/27/2018   Procedure: AMPUTATION BELOW KNEE;  Surgeon: Algernon Huxley, MD;  Location: [facility];  Service: General;  Laterality: Right;   CATARACT EXTRACTION W/PHACO Right 03/27/2020   Procedure: CATARACT EXTRACTION PHACO AND INTRAOCULAR LENS PLACEMENT ([place]) RIGHT DIABETIC 7.54 00:52.5;  Surgeon: Birder Robson, MD;  Location: [place];  Service: Ophthalmology;  Laterality: Right;   CATARACT EXTRACTION W/PHACO Left 05/20/2020   Procedure: CATARACT EXTRACTION PHACO AND INTRAOCULAR LENS PLACEMENT ([facility]) LEFT  DIABETIC 4.95 00:37.6;  Surgeon: Birder Robson, MD;  Location: [place];  Service: Ophthalmology;  Laterality: Left;  Diabetic - oral meds COVID + 04-24-20   CHOLECYSTECTOMY     COLONOSCOPY WITH PROPOFOL N/A 04/16/2019   Procedure: COLONOSCOPY WITH PROPOFOL;  Surgeon: Virgel Manifold, MD;  Location: [facility];  Service: Endoscopy;  Laterality: N/A;   COLONOSCOPY WITH PROPOFOL N/A 01/16/2020   Procedure: COLONOSCOPY WITH PROPOFOL;  Surgeon: Virgel Manifold, MD;  Location: [facility];  Service: Endoscopy;  Laterality: N/A;    ESOPHAGOGASTRODUODENOSCOPY (EGD) WITH PROPOFOL N/A 06/15/2018   Procedure: ESOPHAGOGASTRODUODENOSCOPY (EGD) WITH PROPOFOL;  Surgeon: [place], Benay Pike, MD;  Location: [facility];  Service: Gastroenterology;  Laterality: N/A;   ESOPHAGOGASTRODUODENOSCOPY (EGD) WITH PROPOFOL N/A 01/03/2019   Procedure: ESOPHAGOGASTRODUODENOSCOPY (EGD) WITH PROPOFOL;  Surgeon: Lucilla Lame, MD;  Location: [facility];  Service: Endoscopy;  Laterality: N/A;   ESOPHAGOGASTRODUODENOSCOPY (EGD) WITH PROPOFOL N/A 09/25/2021   Procedure: ESOPHAGOGASTRODUODENOSCOPY (EGD) WITH PROPOFOL;  Surgeon: Lucilla Lame, MD;  Location: [facility];  Service: Endoscopy;  Laterality: N/A;   GIVENS CAPSULE STUDY  04/16/2019   Procedure: GIVENS CAPSULE STUDY;  Surgeon: Virgel Manifold, MD;  Location: [facility];  Service: Endoscopy;;   GIVENS CAPSULE STUDY N/A 06/25/2019   Procedure: GIVENS CAPSULE STUDY;  Surgeon: Jonathon Bellows, MD;  Location: [facility];  Service: Gastroenterology;  Laterality: N/A;   LOWER EXTREMITY ANGIOGRAPHY Right 08/21/2018   Procedure: LOWER EXTREMITY ANGIOGRAPHY;  Surgeon: Algernon Huxley, MD;  Location: [place] [facility];  Service: Cardiovascular;  Laterality: Right;   LOWER EXTREMITY ANGIOGRAPHY Left 08/28/2018   Procedure: LOWER EXTREMITY ANGIOGRAPHY;  Surgeon: Algernon Huxley, MD;  Location: [place] [facility];  Service: Cardiovascular;  Laterality: Left;   LOWER EXTREMITY ANGIOGRAPHY Right 08/28/2018   Procedure: Lower Extremity Angiography;  Surgeon: Algernon Huxley, MD;  Location: [place] [facility];  Service: Cardiovascular;  Laterality: Right;   LOWER EXTREMITY ANGIOGRAPHY Right 12/18/2018   Procedure: Lower Extremity Angiography;  Surgeon: Algernon Huxley, MD;  Location: [place] [facility];  Service: Cardiovascular;  Laterality: Right;   LOWER EXTREMITY ANGIOGRAPHY Right 12/18/2018   Procedure: Lower Extremity Angiography;  Surgeon: Algernon Huxley, MD;  Location: [place] [facility];  Service:  Cardiovascular;  Laterality: Right;   LOWER EXTREMITY ANGIOGRAPHY Left 12/21/2018   Procedure: Lower Extremity Angiography;  Surgeon: Algernon Huxley, MD;  Location: [place] [facility];  Service: Cardiovascular;  Laterality: Left;   LOWER EXTREMITY ANGIOGRAPHY Right 12/21/2018   Procedure: Lower Extremity Angiography;  Surgeon: Algernon Huxley, MD;  Location: [place] [facility];  Service: Cardiovascular;  Laterality: Right;   LOWER EXTREMITY ANGIOGRAPHY Left 12/25/2020   Procedure: LOWER EXTREMITY ANGIOGRAPHY;  Surgeon: Algernon Huxley, MD;  Location: [place] [facility];  Service: Cardiovascular;  Laterality: Left;   LOWER EXTREMITY ANGIOGRAPHY Left 09/21/2021   Procedure: Lower Extremity Angiography;  Surgeon: Algernon Huxley, MD;  Location: [place] [facility];  Service: Cardiovascular;  Laterality: Left;   LOWER EXTREMITY INTERVENTION N/A 12/22/2018   Procedure: LOWER EXTREMITY INTERVENTION;  Surgeon: Algernon Huxley, MD;  Location: [place] [facility];  Service: Cardiovascular;  Laterality: N/A;   LOWER EXTREMITY INTERVENTION Left 12/26/2020   Procedure: LOWER EXTREMITY INTERVENTION;  Surgeon: Algernon Huxley, MD;  Location: [place] [facility];  Service: Cardiovascular;  Laterality: Left;   LOWER EXTREMITY INTERVENTION N/A 09/22/2021   Procedure: LOWER EXTREMITY INTERVENTION;  Surgeon: Algernon Huxley, MD;  Location: [facility]  INVASIVE [facility] LAB;  Service: Cardiovascular;  Laterality: N/A;    Social History Social History   Tobacco Use   Smoking status: Former    Packs/day: 0.25    Years: 45.00    Total pack years: 11.25    Types: Cigarettes   Smokeless tobacco: Never   Tobacco comments:    quit  Vaping Use   Vaping Use: Never used  Substance Use Topics   Alcohol use: No   Drug use: Not Currently    Comment: last used in April 2021 per patient    Family History Family History  Problem Relation Age of Onset   Breast cancer Mother 80    Allergies  Allergen Reactions   Vancomycin Rash    Patient  developed a rash to injection site and arm a few mintes after starting ABX.    Hydrocodone Rash   Metformin And Related Diarrhea   Penicillins Hives    Has patient had a PCN reaction causing immediate rash, facial/tongue/throat swelling, SOB or lightheadedness with hypotension: Yes Has patient had a PCN reaction causing severe rash involving mucus membranes or skin necrosis: No Has patient had a PCN reaction that required hospitalization: No Has patient had a PCN reaction occurring within the last 10 years: No If all of the above answers are "NO", then may proceed with Cephalosporin use.   Tramadol Itching     REVIEW OF SYSTEMS (Negative unless checked)  Constitutional: [] Weight loss  [] Fever  [] Chills Cardiac: [] Chest pain   [] Chest pressure   [] Palpitations   [] Shortness of breath when laying flat   [] Shortness of breath with exertion. Vascular:  [x] Pain in legs with walking   [] Pain in legs at rest  [] History of DVT   [] Phlebitis   [] Swelling in legs   [] Varicose veins   [] Non-healing ulcers Pulmonary:   [] Uses home oxygen   [] Productive cough   [] Hemoptysis   [] Wheeze  [] COPD   [] Asthma Neurologic:  [] Dizziness   [] Seizures   [] History of stroke   [] History of TIA  [] Aphasia   [] Vissual changes   [] Weakness or numbness in arm   [] Weakness or numbness in leg Musculoskeletal:   [] Joint swelling   [] Joint pain   [] Low back pain Hematologic:  [] Easy bruising  [] Easy bleeding   [] Hypercoagulable state   [] Anemic Gastrointestinal:  [] Diarrhea   [] Vomiting  [] Gastroesophageal reflux/heartburn   [] Difficulty swallowing. Genitourinary:  [] Chronic kidney disease   [] Difficult urination  [] Frequent urination   [] Blood in urine Skin:  [] Rashes   [] Ulcers  Psychological:  [] History of anxiety   []  History of major depression.  Physical Examination  Vitals:   10/13/21 1047  BP: 134/75  Pulse: 100  Resp: 18  Temp: 97.8 F (36.6 C)  TempSrc: Oral  SpO2: 98%  Weight: 64.4 kg  Height: 5\' 7"   (1.702 m)   Body mass index is 22.24 kg/m. Gen: WD/WN, NAD Head: [place]/AT, No temporalis wasting.  Ear/Nose/Throat: Hearing grossly intact, nares w/o erythema or drainage Eyes: PER, EOMI, sclera nonicteric.  Neck: Supple, no masses.  No bruit or JVD.  Pulmonary:  Good air movement, no audible wheezing, no use of accessory muscles.  Cardiac: RRR, normal S1, S2, no Murmurs. Vascular:  mild trophic changes, no open wounds Vessel Right Left  Radial Palpable Palpable  PT Not Palpable Not Palpable  DP Not Palpable Not Palpable  Gastrointestinal: soft, non-distended. No guarding/no peritoneal signs.  Musculoskeletal: M/S 5/5 throughout.  No visible deformity.  Neurologic: CN 2-12 intact.  Pain and light touch intact in extremities.  Symmetrical.  Speech is fluent. Motor exam as listed above. Psychiatric: Judgment intact, Mood & affect appropriate for pt's clinical situation. Dermatologic: No rashes or ulcers noted.  No changes consistent with cellulitis.   CBC Lab Results  Component Value Date   WBC 11.7 (H) 09/27/2021   HGB 9.8 (L) 09/27/2021   HCT 29.2 (L) 09/27/2021   MCV 85.1 09/27/2021   PLT 346 09/27/2021    BMET    Component Value Date/Time   NA 142 09/27/2021 1034   K 4.2 09/27/2021 1034   CL 110 09/27/2021 1034   CO2 25 09/27/2021 1034   GLUCOSE 162 (H) 09/27/2021 1034   BUN 17 10/13/2021 1058   CREATININE 0.88 10/13/2021 1058   CALCIUM 9.4 09/27/2021 1034   GFRNONAA >60 10/13/2021 1058   GFRAA >60 11/23/2019 1505   Estimated Creatinine Clearance: 61.2 mL/min (by C-G formula based on SCr of 0.88 mg/dL).  COAG Lab Results  Component Value Date   INR 1.3 (H) 09/21/2021   INR 1.1 12/25/2020   INR 1.0 07/25/2019    Radiology NM GI Blood Loss  Result Date: 09/25/2021 CLINICAL DATA:  Concern for gastrointestinal bleed. EXAM: NUCLEAR MEDICINE GASTROINTESTINAL BLEEDING SCAN TECHNIQUE: Sequential abdominal images were obtained following intravenous administration of  Tc-24m labeled red blood cells. RADIOPHARMACEUTICALS:  20.73 mCi Tc-42m pertechnetate in-vitro labeled red cells. COMPARISON:  CT April 24, 2020. FINDINGS: No abnormal areas of radiotracer uptake in the abdomen or pelvis to suggest acute gastrointestinal bleed. IMPRESSION: No scintigraphic evidence of acute gastrointestinal hemorrhage. Electronically Signed   By: Dahlia Bailiff M.D.   On: 09/25/2021 14:24   PERIPHERAL VASCULAR CATHETERIZATION  Result Date: 09/22/2021 See surgical note for result.  PERIPHERAL VASCULAR CATHETERIZATION  Result Date: 09/21/2021 See surgical note for result.  VAS [place] ABI WITH/WO TBI  Result Date: 09/18/2021  LOWER EXTREMITY DOPPLER STUDY Patient Name:  Sylvia [name]  Date of Exam:   09/17/2021 Medical Rec #: [id number]           Accession #:    [id number] Date of Birth: September 25, 1955            Patient Gender: F Patient Age:   45 years Exam Location:  [facility] Procedure:      VAS [place] ABI WITH/WO TBI Referring Phys: Leotis Pain --------------------------------------------------------------------------------  Indications: Peripheral artery disease, and right BKA.  Vascular Interventions: 08/21/18: Right SFA/popliteal artery PTA/stent;                         08/28/18: Left EIA & SFA/popliteal artery stents;                         08/28/18: Right popliteal, TP trunk & peroneal artery                         thrombectomies/PTAs;                         12/27/18: Right BKA;                         12/25/20: Left SFA/popliteal thrombectomy/PTA;                         12/26/20: Left proximal SFA thombectomy/stent;. Comparison Study: 07/21/2021 Performing Technologist: Elisabeth Cara  [facility] RVS  Examination Guidelines: A complete evaluation includes at minimum, Doppler waveform signals and systolic blood pressure reading at the level of bilateral brachial, anterior tibial, and posterior tibial arteries, when vessel segments are accessible. Bilateral testing is considered an integral part of  a complete examination. Photoelectric Plethysmograph (PPG) waveforms and toe systolic pressure readings are included as required and additional duplex testing as needed. Limited examinations for reoccurring indications may be performed as noted.  ABI Findings: +--------+------------------+-----+--------+--------+ Right   Rt Pressure (mmHg)IndexWaveformComment  +--------+------------------+-----+--------+--------+ [id number]                                     +--------+------------------+-----+--------+--------+ ATA                                    Rt BKA   +--------+------------------+-----+--------+--------+ +---------+------------------+-----+----------+-------+ Left     Lt Pressure (mmHg)IndexWaveform  Comment +---------+------------------+-----+----------+-------+ Brachial 154                                      +---------+------------------+-----+----------+-------+ ATA      73                0.47 monophasic        +---------+------------------+-----+----------+-------+ PTA      67                0.44 monophasic        +---------+------------------+-----+----------+-------+ Great Toe39                0.25 Abnormal          +---------+------------------+-----+----------+-------+ +-------+-----------+-----------+------------+------------+ ABI/TBIToday's ABIToday's TBIPrevious ABIPrevious TBI +-------+-----------+-----------+------------+------------+ Right  Rt BKA                RT BKA                   +-------+-----------+-----------+------------+------------+ Left   .47        .25        .93         .96          +-------+-----------+-----------+------------+------------+ Left ABIs and TBIs appear decreased compared to prior study on 07/21/2021.  Summary: Right: Rt BKA. Left: Resting left ankle-brachial index indicates severe left lower extremity arterial disease. The left toe-brachial index is abnormal.  *See table(s) above for measurements  and observations.  Electronically signed by [name] MD on 09/18/2021 at 11:42:58 AM.    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  This represents a limb threatening ischemia and places the patient at the risk for left limb loss.  Patient should undergo angiography of the left lower extremity with the hope for intervention for limb salvage.  The risks and benefits as well as the alternative therapies was discussed in detail with the patient.  All questions were answered.  Patient agrees to proceed with left lower extremity angiography.  The patient will follow up with me in the office after the procedure.    2.  COPD:  Continue pulmonary medications and aerosols as already ordered, these medications have been reviewed and there are no changes at this time.  3.  Hypertension:   Patient will continue medical management; nephrology is following no changes in oral medications. 4. Diabetes mellitus:   Glucose will be monitored and oral medications been held this morning once the patient has undergone the patient's procedure po intake will be reinitiated and again Accu-Cheks will be used to assess the blood glucose level and treat as needed. The patient will be restarted on the patient's usual hypoglycemic regime      Hortencia Pilar, MD  10/13/2021 1:19 PM

## 2021-10-13 NOTE — Interval H&P Note (Signed)
History and Physical Interval Note:  10/13/2021 1:31 PM  Sylvia [name]  has presented today for surgery, with the diagnosis of LLE Angio  BARD   ASO w rest pain.  The various methods of treatment have been discussed with the patient and family. After consideration of risks, benefits and other options for treatment, the patient has consented to  Procedure(s): Lower Extremity Angiography (Left) as a surgical intervention.  The patient's history has been reviewed, patient examined, no change in status, stable for surgery.  I have reviewed the patient's chart and labs.  Questions were answered to the patient's satisfaction.     Levora Dredge

## 2021-10-13 NOTE — Progress Notes (Signed)
1725 Received patient from [facility] via bed. Patient awake and alert . Right groin lysis catheter present on admission with Heparin at 400 units / hour and Altepace  at.5 mg/hour. Left groin pulse palpable and left posterior tibial pulse per doppler.No pedal pulse found even with doppler. Dr.Schnier aware. Patient medicated for pain on admission. Left forearm shaped abnormally with large scar noted. Patient reports this is from blood clots in her arm previously. Left 1stand 3rd finger are slightly contracted. Patient resting quietly after pain medication given.

## 2021-10-14 ENCOUNTER — Encounter: Payer: Self-pay | Admitting: Vascular Surgery

## 2021-10-14 ENCOUNTER — Encounter
Admission: AD | Disposition: A | Discharge status: Home / Self Care | Payer: Self-pay | Source: Home / Self Care | Attending: Vascular Surgery

## 2021-10-14 DIAGNOSIS — T82868A Thrombosis of vascular prosthetic devices, implants and grafts, initial encounter: Secondary | ICD-10-CM

## 2021-10-14 DIAGNOSIS — I743 Embolism and thrombosis of arteries of the lower extremities: Secondary | ICD-10-CM

## 2021-10-14 DIAGNOSIS — I70222 Atherosclerosis of native arteries of extremities with rest pain, left leg: Secondary | ICD-10-CM

## 2021-10-14 HISTORY — PX: LOWER EXTREMITY ANGIOGRAPHY: CATH118251

## 2021-10-14 LAB — CBC
HCT: 27.8 % — ABNORMAL LOW (ref 36.0–46.0)
Hemoglobin: 9.1 g/dL — ABNORMAL LOW (ref 12.0–15.0)
MCH: 28.2 pg (ref 26.0–34.0)
MCHC: 32.7 g/dL (ref 30.0–36.0)
MCV: 86.1 fL (ref 80.0–100.0)
Platelets: 286 10*3/uL (ref 150–400)
RBC: 3.23 MIL/uL — ABNORMAL LOW (ref 3.87–5.11)
RDW: 14.2 % (ref 11.5–15.5)
WBC: 8.9 10*3/uL (ref 4.0–10.5)
nRBC: 0 % (ref 0.0–0.2)

## 2021-10-14 LAB — RENAL FUNCTION PANEL
Albumin: 3.5 g/dL (ref 3.5–5.0)
Anion gap: 9 (ref 5–15)
BUN: 14 mg/dL (ref 8–23)
CO2: 21 mmol/L — ABNORMAL LOW (ref 22–32)
Calcium: 9.6 mg/dL (ref 8.9–10.3)
Chloride: 111 mmol/L (ref 98–111)
Creatinine, Ser: 0.89 mg/dL (ref 0.44–1.00)
GFR, Estimated: >60 mL/min (ref >60)
Glucose, Bld: 265 mg/dL — ABNORMAL HIGH (ref 70–99)
Phosphorus: 3.5 mg/dL (ref 2.5–4.6)
Potassium: 4.2 mmol/L (ref 3.5–5.1)
Sodium: 141 mmol/L (ref 135–145)

## 2021-10-14 LAB — GLUCOSE, CAPILLARY
Glucose-Capillary: 206 mg/dL — ABNORMAL HIGH (ref 70–99)
Glucose-Capillary: 214 mg/dL — ABNORMAL HIGH (ref 70–99)
Glucose-Capillary: 230 mg/dL — ABNORMAL HIGH (ref 70–99)
Glucose-Capillary: 252 mg/dL — ABNORMAL HIGH (ref 70–99)
Glucose-Capillary: 256 mg/dL — ABNORMAL HIGH (ref 70–99)
Glucose-Capillary: 268 mg/dL — ABNORMAL HIGH (ref 70–99)
Glucose-Capillary: 286 mg/dL — ABNORMAL HIGH (ref 70–99)

## 2021-10-14 LAB — FIBRINOGEN: Fibrinogen: 174 mg/dL — ABNORMAL LOW (ref 210–475)

## 2021-10-14 LAB — CREATININE, SERUM
Creatinine, Ser: 0.88 mg/dL (ref 0.44–1.00)
GFR, Estimated: >60 mL/min (ref >60)

## 2021-10-14 LAB — HEPARIN LEVEL (UNFRACTIONATED): Heparin Unfractionated: 0.38 IU/mL (ref 0.30–0.70)

## 2021-10-14 LAB — BUN: BUN: 14 mg/dL (ref 8–23)

## 2021-10-14 SURGERY — LOWER EXTREMITY ANGIOGRAPHY
Anesthesia: Moderate Sedation | Laterality: Left

## 2021-10-14 MED ORDER — TIROFIBAN HCL IV 12.5 MG/250 ML
0.0750 ug/kg/min | INTRAVENOUS | Status: DC
Start: 2021-10-14 — End: 2021-10-15
  Administered 2021-10-14: 0.075 ug/kg/min via INTRAVENOUS
  Filled 2021-10-14: qty 100

## 2021-10-14 MED ORDER — INSULIN ASPART 100 UNIT/ML IJ SOLN: 0.0000 [IU] | Freq: Three times a day (TID) | INTRAMUSCULAR | Status: DC

## 2021-10-14 MED ORDER — CIPROFLOXACIN IN D5W 400 MG/200ML IV SOLN
400.0000 mg | INTRAVENOUS | Status: DC
  Filled 2021-10-14: qty 200

## 2021-10-14 MED ORDER — METHYLPREDNISOLONE SODIUM SUCC 125 MG IJ SOLR: 125.0000 mg | Freq: Once | INTRAMUSCULAR | Status: DC | PRN

## 2021-10-14 MED ORDER — TIROFIBAN HCL IV 12.5 MG/250 ML
INTRAVENOUS | Status: AC
  Administered 2021-10-14: 1587.5 ug via INTRAVENOUS
  Filled 2021-10-14: qty 250

## 2021-10-14 MED ORDER — FENTANYL CITRATE PF 50 MCG/ML IJ SOSY
PREFILLED_SYRINGE | INTRAMUSCULAR | Status: AC
  Filled 2021-10-14: qty 2

## 2021-10-14 MED ORDER — INSULIN ASPART 100 UNIT/ML IJ SOLN
0.0000 [IU] | Freq: Every day | INTRAMUSCULAR | Status: DC
  Administered 2021-10-14 – 2021-10-15 (×2): 3 [IU] via SUBCUTANEOUS
  Administered 2021-10-16 – 2021-10-18 (×2): 2 [IU] via SUBCUTANEOUS
  Filled 2021-10-14 (×3): qty 1

## 2021-10-14 MED ORDER — HEPARIN SODIUM (PORCINE) 1000 UNIT/ML IJ SOLN
INTRAMUSCULAR | Status: AC
  Filled 2021-10-14: qty 10

## 2021-10-14 MED ORDER — IODIXANOL 320 MG/ML IV SOLN
INTRAVENOUS | Status: DC | PRN
  Administered 2021-10-14: 60 mL via INTRA_ARTERIAL

## 2021-10-14 MED ORDER — FENTANYL CITRATE (PF) 100 MCG/2ML IJ SOLN
INTRAMUSCULAR | Status: DC | PRN
  Administered 2021-10-14: 25 ug via INTRAVENOUS
  Administered 2021-10-14: 50 ug via INTRAVENOUS

## 2021-10-14 MED ORDER — FAMOTIDINE 20 MG PO TABS: 40.0000 mg | ORAL_TABLET | Freq: Once | ORAL | Status: DC | PRN

## 2021-10-14 MED ORDER — MIDAZOLAM HCL 2 MG/2ML IJ SOLN
INTRAMUSCULAR | Status: AC
  Filled 2021-10-14: qty 2

## 2021-10-14 MED ORDER — DIPHENHYDRAMINE HCL 50 MG/ML IJ SOLN: 50.0000 mg | Freq: Once | INTRAMUSCULAR | Status: DC | PRN

## 2021-10-14 MED ORDER — MIDAZOLAM HCL 2 MG/ML PO SYRP: 8.0000 mg | ORAL_SOLUTION | Freq: Once | ORAL | Status: DC | PRN

## 2021-10-14 MED ORDER — MIDAZOLAM HCL 2 MG/2ML IJ SOLN
INTRAMUSCULAR | Status: DC | PRN
  Administered 2021-10-14: 2 mg via INTRAVENOUS
  Administered 2021-10-14: 1 mg via INTRAVENOUS

## 2021-10-14 MED ORDER — ONDANSETRON HCL 4 MG/2ML IJ SOLN: 4.0000 mg | Freq: Four times a day (QID) | INTRAMUSCULAR | Status: DC | PRN

## 2021-10-14 MED ORDER — TIROFIBAN (AGGRASTAT) BOLUS VIA INFUSION
25.0000 ug/kg | Freq: Once | INTRAVENOUS | Status: AC
  Filled 2021-10-14: qty 32

## 2021-10-14 MED ORDER — HEPARIN SODIUM (PORCINE) 1000 UNIT/ML IJ SOLN
INTRAMUSCULAR | Status: DC | PRN
  Administered 2021-10-14: 3000 [IU] via INTRAVENOUS

## 2021-10-14 MED ORDER — CIPROFLOXACIN IN D5W 200 MG/100ML IV SOLN
INTRAVENOUS | Status: DC | PRN
  Administered 2021-10-14: 400 mg via INTRAVENOUS

## 2021-10-14 MED ORDER — HYDROMORPHONE HCL 1 MG/ML IJ SOLN: 1.0000 mg | Freq: Once | INTRAMUSCULAR | Status: DC | PRN

## 2021-10-14 MED ORDER — SODIUM CHLORIDE 0.9 % IV SOLN: INTRAVENOUS | Status: DC

## 2021-10-14 SURGICAL SUPPLY — 10 items
BALLN ULTRVRSE 2X220X150 (BALLOONS) ×2
BALLOON ULTRVRSE 2X220X150 (BALLOONS) IMPLANT
CANISTER PENUMBRA ENGINE (MISCELLANEOUS) ×1 IMPLANT
CATH INDIGO CAT6 KIT (CATHETERS) ×1 IMPLANT
CATH VERT 5X100 (CATHETERS) ×1 IMPLANT
DEVICE STARCLOSE SE CLOSURE (Vascular Products) ×1 IMPLANT
KIT ENCORE 26 ADVANTAGE (KITS) ×1 IMPLANT
PACK ANGIOGRAPHY (CUSTOM PROCEDURE TRAY) ×2 IMPLANT
WIRE G V18X300CM (WIRE) ×1 IMPLANT
WIRE GUIDERIGHT .035X150 (WIRE) ×1 IMPLANT

## 2021-10-14 NOTE — Interval H&P Note (Signed)
History and Physical Interval Note:  10/14/2021 9:29 AM  Sylvia [name]  has presented today for surgery, with the diagnosis of PAD.  The various methods of treatment have been discussed with the patient and family. After consideration of risks, benefits and other options for treatment, the patient has consented to  Procedure(s): Lower Extremity Angiography (Left) as a surgical intervention.  The patient's history has been reviewed, patient examined, no change in status, stable for surgery.  I have reviewed the patient's chart and labs.  Questions were answered to the patient's satisfaction.     [name]

## 2021-10-14 NOTE — Op Note (Signed)
[facility]  Percutaneous Study/Intervention Procedural Note   Date of Surgery: 10/14/2021  Surgeon(s):Holt Woolbright    Assistants:none  Pre-operative Diagnosis: PAD with rest pain left lower extremity, status post overnight thrombolytic therapy  Post-operative diagnosis:  Same  Procedure(s) Performed:             1.  Left lower extremity angiogram             2.  Mechanical thrombectomy of the left SFA, popliteal artery, and anterior tibial artery all the way down to the ankle with the penumbra CAT 6 device             3.  Percutaneous transluminal angioplasty of left dorsalis pedis artery and distal anterior tibial artery with 2 mm diameter by 22 cm length angioplasty balloon             4.  StarClose closure device right femoral artery  EBL: 100 cc  Contrast: 60 cc  Fluoro Time: 7.2 minutes  Moderate Conscious Sedation Time: approximately 46 minutes using 3 mg of Versed and 75 mcg of Fentanyl              Indications:  Patient is a 66 y.o.female with limb threatening ischemia of the left lower extremity.  She has been getting overnight thrombolytic therapy.  The patient is brought in for angiography for further evaluation and potential treatment.  Due to the limb threatening nature of the situation, angiogram was performed for attempted limb salvage. The patient is aware that if the procedure fails, amputation would be expected.  The patient also understands that even with successful revascularization, amputation may still be required due to the severity of the situation.  Risks and benefits are discussed and informed consent is obtained.   Procedure:  The patient was identified and appropriate procedural time out was performed.  The patient was then placed supine on the table and prepped and draped in the usual sterile fashion. Moderate conscious sedation was administered during a face to face encounter with the patient throughout the procedure with my  supervision of the RN administering medicines and monitoring the patient's vital signs, pulse oximetry, telemetry and mental status throughout from the start of the procedure until the patient was taken to the recovery room.  The existing thrombolytic catheter was removed after placing a V18 wire into the anterior tibial artery.  Selective left lower extremity angiogram was then performed. This demonstrated continued disease of the common femoral artery and profunda femoris artery with essentially no flow through the SFA and popliteal stents distally.  There did appear to be reconstitution of the posterior tibial artery distally on initial imaging but this was faint. It was felt that it was in the patient's best interest to proceed with intervention after these images to avoid a second procedure and a larger amount of contrast and fluoroscopy based off of the findings from the initial angiogram. The patient was systemically heparinized. I then used a penumbra CAT 6 catheter and started performing mechanical thrombectomy from the left SFA and popliteal artery through the previously placed stents down into the anterior tibial artery and actually advanced this all the way down to the foot and ankle.  Multiple passes were made.  Following this, with the catheter down into the popliteal artery or proximal anterior tibial artery the SFA and popliteal stents now appear to be patent but the flow is still extremely sluggish and more was going retrograde and antegrade.  The wire was  parked out almost to a digital vessel and a 2 mm diameter by 22 cm length angioplasty balloon was inflated from the midfoot up across the ankle and into the distal anterior tibial artery and inflated to 12 atm for 1 minute.  Now with a catheter in the anterior tibial artery there was continuous flow into the foot without obvious focal stenosis.  There still remained some spasm but no obvious disease other than the common femoral inflow lesion that  extended into the origin of the profunda femoris artery as well as the SFA.  The patient will likely need a femoral endarterectomy.  With the sheath at the aortic bifurcation the iliacs were found to have only mild stenosis that was not flow-limiting. I elected to terminate the procedure. The sheath was removed and StarClose closure device was deployed in the right femoral artery with excellent hemostatic result. The patient was taken to the recovery room in stable condition having tolerated the procedure well.  Findings:                            Left Lower Extremity:  This demonstrated continued disease of the common femoral artery and profunda femoris artery with essentially no flow through the SFA and popliteal stents distally.  There did appear to be reconstitution of the posterior tibial artery distally on initial imaging but this was faint   Disposition: Patient was taken to the recovery room in stable condition having tolerated the procedure well.  Complications: None  Leotis Pain 10/14/2021 11:41 AM   This note was created with Dragon Medical transcription system. Any errors in dictation are purely unintentional.

## 2021-10-14 NOTE — Progress Notes (Signed)
Initial Nutrition Assessment  DOCUMENTATION CODES:   Not applicable  INTERVENTION:   -RD will follow for diet advancement and add supplements as appropriate  NUTRITION DIAGNOSIS:   Increased nutrient needs related to post-op healing as evidenced by estimated needs.  GOAL:   Patient will meet greater than or equal to 90% of their needs  MONITOR:   PO intake, Supplement acceptance, Diet advancement  REASON FOR ASSESSMENT:   Malnutrition Screening Tool    ASSESSMENT:   Pt hx of DM, PVD, chronic pain related to both and prior RLE BKA.  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   6/27- s/p angioplasty  Reviewed I/O's: -725 ml x 24 hours   UOP: 950 ml x 24 hours  Pt unavailable at time of visit (out of room). RD unable to obtain further nutrition-related history or complete nutrition-focused physical exam at this time.     Plan for lower extremity angiography today. Pt currently NPO for procedure.   Reviewed wt hx; wt has been stable over the past 8 months.   Medications reviewed and include 0.9% sodium chloride infusion @ 75 ml/hr.   Lab Results  Component Value Date   HGBA1C 8.2 (H) 09/22/2021   PTA DM medications are 10 mg jardiance daily and 100 mg sitagliptin daily.   Labs reviewed: Mg: 1.6, CBGS: 206-256 (inpatient orders for glycemic control are 0-15 units insulin aspart TID with meals).    Diet Order:   Diet Order             Diet NPO time specified  Diet effective midnight           Diet NPO time specified Except for: Sips with Meds  Diet effective midnight                   EDUCATION NEEDS:   No education needs have been identified at this time  Skin:  Skin Assessment: Reviewed RN Assessment  Last BM:  10/12/21  Height:   Ht  Readings from Last 1 Encounters:  10/14/21 5\' 7"  (1.702 m)    Weight:   Wt Readings from Last 1 Encounters:  10/14/21 63.5 kg    Ideal Body Weight:  57.4 kg (adjusted for rt BKA)  BMI:  Body mass index is 21.93 kg/m.  Estimated Nutritional Needs:   Kcal:  1700-1900  Protein:  90-105 grams  Fluid:  > 1.7 L    10/16/21, RD, LDN, CDCES Registered Dietitian II Certified Diabetes Care and Education Specialist Please refer to [facility] for RD and/or RD on-call/weekend/after hours pager

## 2021-10-15 DIAGNOSIS — I998 Other disorder of circulatory system: Secondary | ICD-10-CM

## 2021-10-15 LAB — HEMOGLOBIN AND HEMATOCRIT, BLOOD
HCT: 26.8 % — ABNORMAL LOW (ref 36.0–46.0)
Hemoglobin: 8.7 g/dL — ABNORMAL LOW (ref 12.0–15.0)

## 2021-10-15 LAB — HEPARIN LEVEL (UNFRACTIONATED): Heparin Unfractionated: 0.11 IU/mL — ABNORMAL LOW (ref 0.30–0.70)

## 2021-10-15 LAB — RENAL FUNCTION PANEL
Albumin: 3.1 g/dL — ABNORMAL LOW (ref 3.5–5.0)
Anion gap: 3 — ABNORMAL LOW (ref 5–15)
BUN: 23 mg/dL (ref 8–23)
CO2: 23 mmol/L (ref 22–32)
Calcium: 8.5 mg/dL — ABNORMAL LOW (ref 8.9–10.3)
Chloride: 112 mmol/L — ABNORMAL HIGH (ref 98–111)
Creatinine, Ser: 1.1 mg/dL — ABNORMAL HIGH (ref 0.44–1.00)
GFR, Estimated: 55 mL/min — ABNORMAL LOW (ref >60)
Glucose, Bld: 310 mg/dL — ABNORMAL HIGH (ref 70–99)
Phosphorus: 2.2 mg/dL — ABNORMAL LOW (ref 2.5–4.6)
Potassium: 4.1 mmol/L (ref 3.5–5.1)
Sodium: 138 mmol/L (ref 135–145)

## 2021-10-15 LAB — CBC
HCT: 21.7 % — ABNORMAL LOW (ref 36.0–46.0)
Hemoglobin: 7 g/dL — ABNORMAL LOW (ref 12.0–15.0)
MCH: 28.1 pg (ref 26.0–34.0)
MCHC: 32.3 g/dL (ref 30.0–36.0)
MCV: 87.1 fL (ref 80.0–100.0)
Platelets: 196 10*3/uL (ref 150–400)
RBC: 2.49 MIL/uL — ABNORMAL LOW (ref 3.87–5.11)
RDW: 14.3 % (ref 11.5–15.5)
WBC: 9.2 10*3/uL (ref 4.0–10.5)
nRBC: 0 % (ref 0.0–0.2)

## 2021-10-15 LAB — URINE DRUG SCREEN, QUALITATIVE (ARMC ONLY)
Amphetamines, Ur Screen: NOT DETECTED
Barbiturates, Ur Screen: NOT DETECTED
Benzodiazepine, Ur Scrn: POSITIVE — AB
Cannabinoid 50 Ng, Ur ~~LOC~~: NOT DETECTED
Cocaine Metabolite,Ur ~~LOC~~: NOT DETECTED
MDMA (Ecstasy)Ur Screen: NOT DETECTED
Methadone Scn, Ur: NOT DETECTED
Opiate, Ur Screen: POSITIVE — AB
Phencyclidine (PCP) Ur S: NOT DETECTED
Tricyclic, Ur Screen: NOT DETECTED

## 2021-10-15 LAB — GLUCOSE, CAPILLARY
Glucose-Capillary: 248 mg/dL — ABNORMAL HIGH (ref 70–99)
Glucose-Capillary: 306 mg/dL — ABNORMAL HIGH (ref 70–99)
Glucose-Capillary: 186 mg/dL — ABNORMAL HIGH (ref 70–99)
Glucose-Capillary: 253 mg/dL — ABNORMAL HIGH (ref 70–99)

## 2021-10-15 LAB — PREPARE RBC (CROSSMATCH)

## 2021-10-15 MED ORDER — INSULIN GLARGINE-YFGN 100 UNIT/ML ~~LOC~~ SOLN
10.0000 [IU] | Freq: Every day | SUBCUTANEOUS | Status: DC
  Administered 2021-10-15 – 2021-10-19 (×5): 10 [IU] via SUBCUTANEOUS
  Filled 2021-10-15 (×6): qty 0.1

## 2021-10-15 MED ORDER — HEPARIN (PORCINE) 25000 UT/250ML-% IV SOLN
1150.0000 [IU]/h | INTRAVENOUS | Status: AC
  Administered 2021-10-15 – 2021-10-17 (×2): 600 [IU]/h via INTRAVENOUS
  Administered 2021-10-19: 1150 [IU]/h via INTRAVENOUS
  Filled 2021-10-15 (×4): qty 250

## 2021-10-15 MED ORDER — SODIUM CHLORIDE 0.9% IV SOLUTION: Freq: Once | INTRAVENOUS | Status: AC

## 2021-10-15 MED ORDER — TRAZODONE HCL 50 MG PO TABS
150.0000 mg | ORAL_TABLET | Freq: Every evening | ORAL | Status: DC | PRN
Start: 2021-10-15 — End: 2021-10-20
  Administered 2021-10-15 – 2021-10-19 (×5): 150 mg via ORAL
  Filled 2021-10-15: qty 3
  Filled 2021-10-15: qty 1
  Filled 2021-10-15: qty 3
  Filled 2021-10-15 (×2): qty 1

## 2021-10-15 MED ORDER — MELATONIN 5 MG PO TABS
10.0000 mg | ORAL_TABLET | Freq: Every evening | ORAL | Status: DC | PRN
Start: 2021-10-15 — End: 2021-10-20
  Administered 2021-10-15 – 2021-10-19 (×4): 10 mg via ORAL
  Filled 2021-10-15 (×4): qty 2

## 2021-10-15 MED ORDER — ENSURE MAX PROTEIN PO LIQD
11.0000 [oz_av] | Freq: Two times a day (BID) | ORAL | Status: DC
  Administered 2021-10-15 – 2021-10-19 (×9): 11 [oz_av] via ORAL
  Filled 2021-10-15: qty 330

## 2021-10-15 MED ORDER — ADULT MULTIVITAMIN W/MINERALS CH
1.0000 | ORAL_TABLET | Freq: Every day | ORAL | Status: DC
  Administered 2021-10-17 – 2021-10-20 (×4): 1 via ORAL
  Filled 2021-10-15 (×4): qty 1

## 2021-10-15 NOTE — Anesthesia Preprocedure Evaluation (Addendum)
Anesthesia Evaluation  Patient identified by MRN, date of birth, ID band Patient awake    Reviewed: Allergy & Precautions, NPO status , Patient's Chart, lab work & pertinent test results  History of Anesthesia Complications Negative for: history of anesthetic complications  Airway Mallampati: III   Neck ROM: Full    Dental  (+) Edentulous Lower, Edentulous Upper   Pulmonary COPD, Current Smoker and Patient abstained from smoking., former smoker,  COVID+ 04/24/20   Pulmonary exam normal breath sounds clear to auscultation       Cardiovascular hypertension, + Peripheral Vascular Disease (s/p right BKA on Eliquis)  Normal cardiovascular exam Rhythm:Regular Rate:Normal  bp under control-off meds since 2019   Neuro/Psych PSYCHIATRIC DISORDERS Anxiety Chronic pain related to both and prior RLE BKA    GI/Hepatic GERD  Controlled,H/o DU   Endo/Other  diabetes, Poorly Controlled, Type 2, Insulin Dependentlast A1c 8.7% Sept 2022  Renal/GU negative Renal ROS     Musculoskeletal  (+) Arthritis , Rheumatoid disorders,  RA on MTX/plaquenil, pred chronically as well as TNF blocker    Abdominal   Peds  Hematology  (+) Blood dyscrasia, anemia ,   Anesthesia Other Findings 09/22/11 angiogram w/ angioplasty, TPA administration 6/6 back to OR> angioplasty/PCI popliteal artery 6/27- overnight thrombolytic therapy 6/28- Left lower extremity angiogram 2. Mechanical thrombectomy of the left SFA, popliteal artery, and anterior tibial artery all the way down to the ankle with the penumbra CAT 6 device 3. Percutaneous transluminal angioplasty of left dorsalis pedis artery and distal anterior tibial artery with 2 mm diameter by 22 cm length angioplasty balloon   Reproductive/Obstetrics                            Anesthesia Physical  Anesthesia Plan  ASA: 3  Anesthesia Plan: General    Post-op Pain Management: Ofirmev IV (intra-op)*, Ketamine IV* and Dilaudid IV   Induction: Intravenous  PONV Risk Score and Plan: 1 and Ondansetron and Dexamethasone  Airway Management Planned: Oral ETT  Additional Equipment:   Intra-op Plan:   Post-operative Plan: Extubation in OR  Informed Consent: I have reviewed the patients History and Physical, chart, labs and discussed the procedure including the risks, benefits and alternatives for the proposed anesthesia with the patient or authorized representative who has indicated his/her understanding and acceptance.       Plan Discussed with: CRNA  Anesthesia Plan Comments:       Anesthesia Quick Evaluation

## 2021-10-16 ENCOUNTER — Inpatient Hospital Stay: Payer: Medicare Other | Admitting: Anesthesiology

## 2021-10-16 ENCOUNTER — Other Ambulatory Visit: Payer: Self-pay

## 2021-10-16 ENCOUNTER — Encounter
Admission: AD | Disposition: A | Discharge status: Home / Self Care | Payer: Self-pay | Source: Home / Self Care | Attending: Vascular Surgery

## 2021-10-16 ENCOUNTER — Encounter: Payer: Self-pay | Admitting: Vascular Surgery

## 2021-10-16 DIAGNOSIS — I10 Essential (primary) hypertension: Secondary | ICD-10-CM

## 2021-10-16 DIAGNOSIS — I70222 Atherosclerosis of native arteries of extremities with rest pain, left leg: Secondary | ICD-10-CM | POA: Diagnosis not present

## 2021-10-16 DIAGNOSIS — I998 Other disorder of circulatory system: Secondary | ICD-10-CM

## 2021-10-16 HISTORY — PX: APPLICATION OF WOUND VAC: SHX5189

## 2021-10-16 HISTORY — PX: ENDARTERECTOMY FEMORAL: SHX5804

## 2021-10-16 LAB — CBC
HCT: 24.8 % — ABNORMAL LOW (ref 36.0–46.0)
Hemoglobin: 8.1 g/dL — ABNORMAL LOW (ref 12.0–15.0)
MCH: 28.5 pg (ref 26.0–34.0)
MCHC: 32.7 g/dL (ref 30.0–36.0)
MCV: 87.3 fL (ref 80.0–100.0)
Platelets: 203 10*3/uL (ref 150–400)
RBC: 2.84 MIL/uL — ABNORMAL LOW (ref 3.87–5.11)
RDW: 14.3 % (ref 11.5–15.5)
WBC: 9.2 10*3/uL (ref 4.0–10.5)
nRBC: 0 % (ref 0.0–0.2)

## 2021-10-16 LAB — RENAL FUNCTION PANEL
Albumin: 2.8 g/dL — ABNORMAL LOW (ref 3.5–5.0)
Anion gap: 6 (ref 5–15)
BUN: 20 mg/dL (ref 8–23)
CO2: 23 mmol/L (ref 22–32)
Calcium: 9.1 mg/dL (ref 8.9–10.3)
Chloride: 111 mmol/L (ref 98–111)
Creatinine, Ser: 0.68 mg/dL (ref 0.44–1.00)
GFR, Estimated: >60 mL/min (ref >60)
Glucose, Bld: 220 mg/dL — ABNORMAL HIGH (ref 70–99)
Phosphorus: 3.6 mg/dL (ref 2.5–4.6)
Potassium: 3.8 mmol/L (ref 3.5–5.1)
Sodium: 140 mmol/L (ref 135–145)

## 2021-10-16 LAB — PREPARE RBC (CROSSMATCH)

## 2021-10-16 LAB — GLUCOSE, CAPILLARY
Glucose-Capillary: 213 mg/dL — ABNORMAL HIGH (ref 70–99)
Glucose-Capillary: 200 mg/dL — ABNORMAL HIGH (ref 70–99)
Glucose-Capillary: 232 mg/dL — ABNORMAL HIGH (ref 70–99)
Glucose-Capillary: 232 mg/dL — ABNORMAL HIGH (ref 70–99)
Glucose-Capillary: 281 mg/dL — ABNORMAL HIGH (ref 70–99)

## 2021-10-16 SURGERY — ENDARTERECTOMY, FEMORAL
Anesthesia: General

## 2021-10-16 MED ORDER — SUGAMMADEX SODIUM 200 MG/2ML IV SOLN
INTRAVENOUS | Status: DC | PRN
  Administered 2021-10-16: 200 mg via INTRAVENOUS

## 2021-10-16 MED ORDER — KETAMINE HCL 50 MG/5ML IJ SOSY
PREFILLED_SYRINGE | INTRAMUSCULAR | Status: AC
  Filled 2021-10-16: qty 5

## 2021-10-16 MED ORDER — ACETAMINOPHEN 10 MG/ML IV SOLN
INTRAVENOUS | Status: DC | PRN
  Administered 2021-10-16: 1000 mg via INTRAVENOUS

## 2021-10-16 MED ORDER — ACETAMINOPHEN 10 MG/ML IV SOLN
INTRAVENOUS | Status: AC
  Filled 2021-10-16: qty 100

## 2021-10-16 MED ORDER — INSULIN ASPART 100 UNIT/ML IJ SOLN
INTRAMUSCULAR | Status: AC
  Filled 2021-10-16: qty 1

## 2021-10-16 MED ORDER — LIDOCAINE HCL (CARDIAC) PF 100 MG/5ML IV SOSY
PREFILLED_SYRINGE | INTRAVENOUS | Status: DC | PRN
  Administered 2021-10-16: 100 mg via INTRAVENOUS

## 2021-10-16 MED ORDER — ONDANSETRON HCL 4 MG/2ML IJ SOLN
4.0000 mg | Freq: Four times a day (QID) | INTRAMUSCULAR | Status: DC | PRN
  Administered 2021-10-20: 4 mg via INTRAVENOUS
  Filled 2021-10-16: qty 2

## 2021-10-16 MED ORDER — ONDANSETRON HCL 4 MG/2ML IJ SOLN
INTRAMUSCULAR | Status: DC | PRN
  Administered 2021-10-16: 4 mg via INTRAVENOUS

## 2021-10-16 MED ORDER — "VISTASEAL 4 ML SINGLE DOSE KIT "
PACK | CUTANEOUS | Status: DC | PRN
  Administered 2021-10-16: 4 mL via TOPICAL

## 2021-10-16 MED ORDER — HYDROMORPHONE HCL 1 MG/ML IJ SOLN
INTRAMUSCULAR | Status: DC | PRN
  Administered 2021-10-16 (×4): .25 mg via INTRAVENOUS

## 2021-10-16 MED ORDER — SODIUM CHLORIDE 0.9 % IV SOLN: INTRAVENOUS | Status: DC | PRN

## 2021-10-16 MED ORDER — PROPOFOL 10 MG/ML IV BOLUS
INTRAVENOUS | Status: DC | PRN
  Administered 2021-10-16: 100 mg via INTRAVENOUS

## 2021-10-16 MED ORDER — FENTANYL CITRATE (PF) 100 MCG/2ML IJ SOLN
INTRAMUSCULAR | Status: AC
  Administered 2021-10-16: 50 ug
  Filled 2021-10-16: qty 2

## 2021-10-16 MED ORDER — OXYCODONE HCL 5 MG/5ML PO SOLN: 5.0000 mg | Freq: Once | ORAL | Status: DC | PRN

## 2021-10-16 MED ORDER — HYDROMORPHONE HCL 1 MG/ML IJ SOLN
1.0000 mg | Freq: Once | INTRAMUSCULAR | Status: AC | PRN
  Administered 2021-10-16: 1 mg via INTRAVENOUS
  Filled 2021-10-16: qty 1

## 2021-10-16 MED ORDER — CHLORHEXIDINE GLUCONATE CLOTH 2 % EX PADS
6.0000 | MEDICATED_PAD | Freq: Once | CUTANEOUS | Status: AC
  Administered 2021-10-16: 6 via TOPICAL

## 2021-10-16 MED ORDER — ROCURONIUM BROMIDE 100 MG/10ML IV SOLN
INTRAVENOUS | Status: DC | PRN
  Administered 2021-10-16: 50 mg via INTRAVENOUS
  Administered 2021-10-16: 30 mg via INTRAVENOUS

## 2021-10-16 MED ORDER — HEPARIN SODIUM (PORCINE) 5000 UNIT/ML IJ SOLN
INTRAMUSCULAR | Status: AC
  Filled 2021-10-16: qty 1

## 2021-10-16 MED ORDER — DROPERIDOL 2.5 MG/ML IJ SOLN: 0.6250 mg | Freq: Once | INTRAMUSCULAR | Status: DC | PRN

## 2021-10-16 MED ORDER — DEXAMETHASONE SODIUM PHOSPHATE 10 MG/ML IJ SOLN
INTRAMUSCULAR | Status: DC | PRN
  Administered 2021-10-16: 10 mg via INTRAVENOUS

## 2021-10-16 MED ORDER — FENTANYL CITRATE (PF) 100 MCG/2ML IJ SOLN
INTRAMUSCULAR | Status: AC
  Filled 2021-10-16: qty 2

## 2021-10-16 MED ORDER — MIDAZOLAM HCL 2 MG/2ML IJ SOLN
INTRAMUSCULAR | Status: DC | PRN
  Administered 2021-10-16: 2 mg via INTRAVENOUS

## 2021-10-16 MED ORDER — PROMETHAZINE HCL 25 MG/ML IJ SOLN: 6.2500 mg | INTRAMUSCULAR | Status: DC | PRN

## 2021-10-16 MED ORDER — HEPARIN SODIUM (PORCINE) 1000 UNIT/ML IJ SOLN
INTRAMUSCULAR | Status: DC | PRN
  Administered 2021-10-16: 5000 [IU] via INTRAVENOUS

## 2021-10-16 MED ORDER — 0.9 % SODIUM CHLORIDE (POUR BTL) OPTIME
TOPICAL | Status: DC | PRN
  Administered 2021-10-16: 500 mL

## 2021-10-16 MED ORDER — FENTANYL CITRATE (PF) 100 MCG/2ML IJ SOLN
INTRAMUSCULAR | Status: DC | PRN
  Administered 2021-10-16: 100 ug via INTRAVENOUS

## 2021-10-16 MED ORDER — HYDROMORPHONE HCL 1 MG/ML IJ SOLN
INTRAMUSCULAR | Status: AC
  Filled 2021-10-16: qty 1

## 2021-10-16 MED ORDER — CHLORHEXIDINE GLUCONATE CLOTH 2 % EX PADS
6.0000 | MEDICATED_PAD | Freq: Once | CUTANEOUS | Status: AC
Start: 2021-10-16 — End: 2021-10-16
  Administered 2021-10-16: 6 via TOPICAL

## 2021-10-16 MED ORDER — FENTANYL CITRATE (PF) 100 MCG/2ML IJ SOLN
25.0000 ug | INTRAMUSCULAR | Status: DC | PRN
  Administered 2021-10-16: 25 ug via INTRAVENOUS
  Filled 2021-10-16: qty 2

## 2021-10-16 MED ORDER — CIPROFLOXACIN IN D5W 400 MG/200ML IV SOLN: 400.0000 mg | INTRAVENOUS | Status: DC

## 2021-10-16 MED ORDER — CEFAZOLIN SODIUM-DEXTROSE 1-4 GM/50ML-% IV SOLN
INTRAVENOUS | Status: DC | PRN
  Administered 2021-10-16: 2 g via INTRAVENOUS

## 2021-10-16 MED ORDER — INSULIN ASPART 100 UNIT/ML IJ SOLN
4.0000 [IU] | Freq: Once | INTRAMUSCULAR | Status: AC
  Administered 2021-10-16: 4 [IU] via SUBCUTANEOUS

## 2021-10-16 MED ORDER — ACETAMINOPHEN 10 MG/ML IV SOLN: 1000.0000 mg | Freq: Once | INTRAVENOUS | Status: DC | PRN

## 2021-10-16 MED ORDER — PROPOFOL 1000 MG/100ML IV EMUL
INTRAVENOUS | Status: AC
  Filled 2021-10-16: qty 100

## 2021-10-16 MED ORDER — HEMOSTATIC AGENTS (NO CHARGE) OPTIME
TOPICAL | Status: DC | PRN
  Administered 2021-10-16: 1

## 2021-10-16 MED ORDER — PHENYLEPHRINE HCL-NACL 20-0.9 MG/250ML-% IV SOLN
INTRAVENOUS | Status: DC | PRN
  Administered 2021-10-16: 20 ug/min via INTRAVENOUS

## 2021-10-16 MED ORDER — MIDAZOLAM HCL 2 MG/2ML IJ SOLN
INTRAMUSCULAR | Status: AC
  Filled 2021-10-16: qty 2

## 2021-10-16 MED ORDER — HEPARIN 5000 UNITS IN NS 1000 ML (FLUSH)
INTRAMUSCULAR | Status: DC | PRN
  Administered 2021-10-16: 1 via INTRAMUSCULAR

## 2021-10-16 MED ORDER — OXYCODONE HCL 5 MG PO TABS: 5.0000 mg | ORAL_TABLET | Freq: Once | ORAL | Status: DC | PRN

## 2021-10-16 SURGICAL SUPPLY — 77 items
"PENCIL ELECTRO HAND CTR " (MISCELLANEOUS) IMPLANT
ADH SKN CLS APL DERMABOND .7 (GAUZE/BANDAGES/DRESSINGS) ×3
APL PRP STRL LF DISP 70% ISPRP (MISCELLANEOUS) ×3
APPLIER CLIP 11 MED OPEN (CLIP)
APPLIER CLIP 9.375 SM OPEN (CLIP)
APR CLP MED 11 20 MLT OPN (CLIP)
APR CLP SM 9.3 20 MLT OPN (CLIP)
BAG DECANTER FOR FLEXI CONT (MISCELLANEOUS) ×3 IMPLANT
BAG ISL LRG 20X20 DRWSTRG (DRAPES)
BAG ISOLATATION DRAPE 20X20 ST (DRAPES) IMPLANT
BLADE SURG 15 STRL LF DISP TIS (BLADE) ×3 IMPLANT
BLADE SURG 15 STRL SS (BLADE) ×4
BLADE SURG SZ11 CARB STEEL (BLADE) ×4 IMPLANT
BOOT SUTURE AID YELLOW STND (SUTURE) ×4 IMPLANT
BRUSH SCRUB EZ  4% CHG (MISCELLANEOUS) ×4
BRUSH SCRUB EZ 4% CHG (MISCELLANEOUS) ×3 IMPLANT
CANISTER WOUND CARE 500ML ATS (WOUND CARE) ×1 IMPLANT
CHLORAPREP W/TINT 26 (MISCELLANEOUS) ×4 IMPLANT
CLIP APPLIE 11 MED OPEN (CLIP) IMPLANT
CLIP APPLIE 9.375 SM OPEN (CLIP) IMPLANT
DERMABOND ADVANCED (GAUZE/BANDAGES/DRESSINGS) ×1
DERMABOND ADVANCED .7 DNX12 (GAUZE/BANDAGES/DRESSINGS) ×3 IMPLANT
DRAPE INCISE IOBAN 66X45 STRL (DRAPES) ×4 IMPLANT
DRAPE ISOLATE BAG 20X20 STRL (DRAPES)
DRSG OPSITE POSTOP 4X6 (GAUZE/BANDAGES/DRESSINGS) IMPLANT
ELECT CAUTERY BLADE 6.4 (BLADE) ×4 IMPLANT
ELECT REM PT RETURN 9FT ADLT (ELECTROSURGICAL) ×4
ELECTRODE REM PT RTRN 9FT ADLT (ELECTROSURGICAL) ×3 IMPLANT
GAUZE 4X4 16PLY ~~LOC~~+RFID DBL (SPONGE) ×4 IMPLANT
GLOVE BIO SURGEON STRL SZ7 (GLOVE) ×8 IMPLANT
GOWN STRL REUS W/ TWL LRG LVL3 (GOWN DISPOSABLE) ×3 IMPLANT
GOWN STRL REUS W/ TWL XL LVL3 (GOWN DISPOSABLE) ×6 IMPLANT
GOWN STRL REUS W/TWL LRG LVL3 (GOWN DISPOSABLE) ×4
GOWN STRL REUS W/TWL XL LVL3 (GOWN DISPOSABLE) ×8
HEMOSTAT SURGICEL 2X3 (HEMOSTASIS) ×4 IMPLANT
IV NS 500ML (IV SOLUTION) ×4
IV NS 500ML BAXH (IV SOLUTION) ×3 IMPLANT
KIT TURNOVER KIT A (KITS) ×4 IMPLANT
LABEL OR SOLS (LABEL) ×4 IMPLANT
LOOP RED MAXI  1X406MM (MISCELLANEOUS) ×8
LOOP VESSEL MAXI  1X406 RED (MISCELLANEOUS) ×6
LOOP VESSEL MAXI 1X406 RED (MISCELLANEOUS) ×6 IMPLANT
LOOP VESSEL MINI 0.8X406 BLUE (MISCELLANEOUS) ×6 IMPLANT
LOOPS BLUE MINI 0.8X406MM (MISCELLANEOUS) ×8
MANIFOLD NEPTUNE II (INSTRUMENTS) ×4 IMPLANT
NDL SAFETY ECLIPSE 18X1.5 (NEEDLE) ×3 IMPLANT
NEEDLE HYPO 18GX1.5 SHARP (NEEDLE) ×4
NS IRRIG 500ML POUR BTL (IV SOLUTION) ×4 IMPLANT
PACK BASIN MAJOR ARMC (MISCELLANEOUS) ×4 IMPLANT
PACK UNIVERSAL (MISCELLANEOUS) ×4 IMPLANT
PATCH CAROTID ECM VASC 1X10 (Prosthesis & Implant Heart) ×1 IMPLANT
PENCIL ELECTRO HAND CTR (MISCELLANEOUS) IMPLANT
RETRACTOR TRAXI PANNICULUS (MISCELLANEOUS) IMPLANT
SET WALTER ACTIVATION W/DRAPE (SET/KITS/TRAYS/PACK) ×4 IMPLANT
SPONGE T-LAP 18X18 ~~LOC~~+RFID (SPONGE) ×8 IMPLANT
SUT MNCRL 4-0 (SUTURE) ×4
SUT MNCRL 4-0 27XMFL (SUTURE) ×3
SUT PROLENE 5 0 RB 1 DA (SUTURE) ×8 IMPLANT
SUT PROLENE 6 0 BV (SUTURE) ×16 IMPLANT
SUT PROLENE 7 0 BV 1 (SUTURE) ×8 IMPLANT
SUT SILK 2 0 (SUTURE) ×4
SUT SILK 2-0 18XBRD TIE 12 (SUTURE) ×3 IMPLANT
SUT SILK 3 0 (SUTURE) ×4
SUT SILK 3-0 18XBRD TIE 12 (SUTURE) ×3 IMPLANT
SUT SILK 4 0 (SUTURE) ×4
SUT SILK 4-0 18XBRD TIE 12 (SUTURE) ×3 IMPLANT
SUT VIC AB 2-0 CT1 27 (SUTURE) ×8
SUT VIC AB 2-0 CT1 TAPERPNT 27 (SUTURE) ×6 IMPLANT
SUT VIC AB 3-0 SH 27 (SUTURE) ×4
SUT VIC AB 3-0 SH 27X BRD (SUTURE) ×3 IMPLANT
SUT VICRYL+ 3-0 36IN CT-1 (SUTURE) ×8 IMPLANT
SUTURE MNCRL 4-0 27XMF (SUTURE) ×3 IMPLANT
SYR 20ML LL LF (SYRINGE) ×4 IMPLANT
SYR 5ML LL (SYRINGE) ×4 IMPLANT
TRAXI PANNICULUS RETRACTOR (MISCELLANEOUS)
TRAY FOLEY MTR SLVR 16FR STAT (SET/KITS/TRAYS/PACK) ×3 IMPLANT
WATER STERILE IRR 500ML POUR (IV SOLUTION) ×3 IMPLANT

## 2021-10-16 NOTE — Transfer of Care (Signed)
Immediate Anesthesia Transfer of Care Note  Patient: Sylvia [name]  Procedure(s) Performed: ENDARTERECTOMY FEMORAL (Left) APPLICATION OF CELL SAVER APPLICATION OF WOUND VAC  Patient Location: PACU  Anesthesia Type:General  Level of Consciousness: awake  Airway & Oxygen Therapy: Patient Spontanous Breathing  Post-op Assessment: Report given to RN and Post -op Vital signs reviewed and stable  Post vital signs: Reviewed and stable  Last Vitals:  Vitals Value Taken Time  BP 132/67 10/16/21 1000  Temp    Pulse 102 10/16/21 1001  Resp 18 10/16/21 1001  SpO2 95 % 10/16/21 1001  Vitals shown include unvalidated device data.  Last Pain:  Vitals:   10/16/21 0700  TempSrc: Oral  PainSc:       Patients Stated Pain Goal: 2 (10/15/21 0858)  Complications: No notable events documented.

## 2021-10-16 NOTE — Op Note (Signed)
OPERATIVE NOTE   PROCEDURE: Left common femoral and profunda femoris endarterectomy with Cormatrix patch angioplasty  PRE-OPERATIVE DIAGNOSIS: Atherosclerotic occlusive disease left lower extremity with lifestyle limiting claudication and rest pain symptoms; hypertension  POST-OPERATIVE DIAGNOSIS: Same  CO-SURGEON: Renford Dills, MD and Annice Needy, M.D.  ASSISTANT(S): None  ANESTHESIA: general  ESTIMATED BLOOD LOSS: 50 cc  FINDING(S): Profound calcific plaque noted of the left common femoral extending past the initial bifurcation of the profunda femoris arteries.  SPECIMEN(S):  Calcific plaque from the common femoral and the profunda femoris artery  INDICATIONS:   Sylvia [name] 66 y.o. y.o.female who presents with complaints of lifestyle limiting claudication and pain continuously in the left left lower extremity. The patient has documented severe atherosclerotic occlusive disease and has undergone minimally invasive treatments in the past. However, at this point his primary area of stricture stenosis resides in the common femoral and origins of the superficial femoral and profunda femoris extending into these arteries and therefore this is not amenable to intervention. The patient is therefore undergoing open endarterectomy. The risks and benefits of surgery have been reviewed with the patient, all questions have answered; alternative therapies have been reviewed as well and the patient has agreed to proceed with surgical open repair.  DESCRIPTION: After obtaining full informed written consent, the patient was brought back to the operating room and placed supine upon the operating table.  The patient received IV antibiotics prior to induction.  After obtaining adequate anesthesia, the patient was prepped and draped in the standard fashion for left femoral exposure.  Co-surgeons are required because this is a complicated procedure  with work being performed simultaneously from both the patient's right left sides.  This also expedites the procedure making a shorter operative time reducing complications and improving patient safety.  Attention was turned to the left groin with Dr. Wyn Quaker working on the patient's left and myself working on the right of the patient.  Vertical  Incision was made over the left common femoral artery and dissection carried down to the common femoral artery with electrocautery.  I dissected out the common femoral artery from the distal external iliac artery (identified by the superficial circumflex vessels) down to the femoral bifurcation.  On initial inspection, the common femoral artery was: densely calcified and there was no palpable pulse noted.    Subsequently the dissection was continued to include all circumflex branches and the profunda femoral artery and superficial femoral artery. The superficial femoral artery was dissected circumferentially for a distance of approximately 3-4 cm and the profunda femoris was dissected circumferentially out to the fourth order branches individual vessel loops were placed around each branch.  Control of all branches was obtained with vessel loops.  A softer area in the distal external iliac artery amendable to clamping was identified.    The patient was given 5000 units of Heparin intravenously, which was a therapeutic bolus.   After waiting 3 minutes, the distal external iliac artery was clamped and all of the vessel loops were placed under tension.  Arteriotomy was made in the common femoral artery with a 11-blade and extended it with a Potts scissor proximally and distally extending the distal end down to the SFA.   Endarterectomy was then performed under direct visualization using a freer elevator and a right angle beginning in the mid common femoral extending up both proximally and distally. Proximally the endarterectomy was brought up to the level of the clamp where a  clean edge was obtained.  Distally the endarterectomy was carried down to the origin of the SFA where previously placed stents are now visualized.  6-0 Prolene interrupted tacking sutures were placed to secure the leading edge of the plaque and the stents in the SFA.  The profunda femoris was treated with an eversion technique extending endarterectomy approximately 2 cm distally again obtaining a featheredge on left side.   At this point, a corematrix patch was fashioned for the geometry of the arteriotomy.  The patch was sewn to the artery with 2 running stitches of 6-0 Prolene, running from each end.  Prior to completing the patch angioplasty, the profunda femoral artery was flushed as was the superficial femoral artery. The system was then forward flushed. The endarterectomy site was then irrigated copiously with heparinized saline. The patch angioplasty was completed in the usual fashion.  Flow was then reestablished first to the profunda femoris and then the superficial femoral artery. Any gaps or bleeding sites in the suture line were easily controlled with a 6-0 Prolene suture. Doppler is then delivered onto the field and the SFA as well as the profunda femoris arteries were interrogated and found to have triphasic Doppler signals.  The left groin was then irrigated copiously with sterile saline and subsequently Evicel and Surgicel were placed in the wound. The incision was repaired with a double layer of 2-0 Vicryl, a double layer of 3-0 Vicryl, and a layer of 4-0 Monocryl in a subcuticular fashion.  A Prevena disposable wound VAC was then placed over the left groin for wound management.  COMPLICATIONS: None  CONDITION: Sylvia [name], M.D. [facility] Office: [phone]  10/16/2021, 9:58 AM

## 2021-10-16 NOTE — Anesthesia Postprocedure Evaluation (Signed)
Anesthesia Post Note  Patient: Sylvia [name]  Procedure(s) Performed: ENDARTERECTOMY FEMORAL (Left) APPLICATION OF CELL SAVER APPLICATION OF WOUND VAC  Patient location during evaluation: PACU Anesthesia Type: General Level of consciousness: awake and alert Pain management: pain level controlled Vital Signs Assessment: post-procedure vital signs reviewed and stable Respiratory status: spontaneous breathing, nonlabored ventilation and respiratory function stable Cardiovascular status: blood pressure returned to baseline and stable Postop Assessment: no apparent nausea or vomiting Anesthetic complications: no   No notable events documented.   Last Vitals:  Vitals:   10/16/21 1215 10/16/21 1230  BP: 132/78 136/81  Pulse: (!) 101 (!) 103  Resp: (!) 21 19  Temp:    SpO2: 95% 93%    Last Pain:  Vitals:   10/16/21 1200  TempSrc: Oral  PainSc:                  Foye Deer

## 2021-10-16 NOTE — Progress Notes (Signed)
[facility]  Daily Progress Note   Subjective  -   Sylvia [name] is a 65-year-old female who recently underwent revascularization attempts on her left lower extremity on 10/14/2021.  Revascularization attempts were unsuccessful and it was noted that there was significant disease of the common femoral artery as well as profunda femoris.  Despite revascularization the patient continued to have no flow into the SFA and popliteal stents distally.  There was some mild reconstruction of the posterior tibial artery distally.  Since her procedure yesterday the patient has some soreness but she notes that her leg does feel improved.  She has been on heparin drip and tolerating well.  The patient previously had a GI bleed following thrombolytic therapy.  She denies any issues with melena since her procedures.  The patient's vitals have been stable and her hemoglobin is currently stable however the patient has received a unit of blood.  Objective Vitals:   10/15/21 2100 10/15/21 2200 10/15/21 2300 10/16/21 0000  BP: (!) 141/71 (!) 119/56 (!) 145/78 (!) 96/59  Pulse: (!) 106 100 (!) 104 (!) 107  Resp: (!) 21 18 19 18  Temp:    98.3 F (36.8 C)  TempSrc:    Oral  SpO2: 96% 98% 99% 94%  Weight:      Height:        Intake/Output Summary (Last 24 hours) at 10/16/2021 0050 Last data filed at 10/16/2021 0000 Gross per 24 hour  Intake 950.46 ml  Output 1745 ml  Net -794.54 ml    PULM  CTAB CV  RRR VASC  left foot warm with minimal edema  Laboratory CBC    Component Value Date/Time   WBC 9.2 10/15/2021 0340   HGB 8.7 (L) 10/15/2021 1308   HGB 8.1 (L) 05/09/2019 1539   HCT 26.8 (L) 10/15/2021 1308   HCT 24.6 (L) 05/09/2019 1539   PLT 196 10/15/2021 0340   PLT 470 (H) 05/09/2019 1539    BMET    Component Value Date/Time   NA 138 10/15/2021 0340   K 4.1 10/15/2021 0340   CL 112 (H) 10/15/2021 0340   CO2 23 10/15/2021 0340   GLUCOSE 310 (H) 10/15/2021 0340    BUN 23 10/15/2021 0340   CREATININE 1.10 (H) 10/15/2021 0340   CALCIUM 8.5 (L) 10/15/2021 0340   GFRNONAA 55 (L) 10/15/2021 0340   GFRAA >60 11/23/2019 1505    Assessment/Planning: POD #1 s/p left lower extremity angiogram  The patient will have upcoming left femoral endarterectomy tomorrow.  Patient is currently resting comfortably on heparin drip.  We will plan to hold heparin drip at 6 AM.  Patient is still ready to proceed with surgery.   Jalani Rominger E Alfie Alderfer  10/16/2021, 12:50 AM      

## 2021-10-16 NOTE — Anesthesia Procedure Notes (Signed)
Procedure Name: Intubation Date/Time: 10/16/2021 7:45 AM  Performed by: Cheral Bay, CRNAPre-anesthesia Checklist: Patient identified, Emergency Drugs available, Suction available and Patient being monitored Patient Re-evaluated:Patient Re-evaluated prior to induction Oxygen Delivery Method: Circle system utilized Preoxygenation: Pre-oxygenation with 100% oxygen Induction Type: IV induction Ventilation: Mask ventilation without difficulty Laryngoscope Size: McGraph and 3 Grade View: Grade I Tube type: Oral Tube size: 7.0 mm Number of attempts: 1 Airway Equipment and Method: Stylet Placement Confirmation: ETT inserted through vocal cords under direct vision, positive ETCO2 and breath sounds checked- equal and bilateral Secured at: 21 cm Tube secured with: Tape Dental Injury: Teeth and Oropharynx as per pre-operative assessment

## 2021-10-16 NOTE — Op Note (Addendum)
OPERATIVE NOTE   PROCEDURE: 1.   Left common femoral, profunda femoris artery endarterectomies and patch angioplasty 2.   Incisional VAC placement left groin    PRE-OPERATIVE DIAGNOSIS: 1.Atherosclerotic occlusive disease left lower extremities with rest pain   POST-OPERATIVE DIAGNOSIS: Same  SURGEON: [name], MD  CO-surgeon:  Levora Dredge, MD  ANESTHESIA:  general  ESTIMATED BLOOD LOSS: 50 cc  FINDING(S): 1.  significant plaque in left common femoral and profunda femoris arteries  SPECIMEN(S):  Left common femoral and profunda femoris plaque.  INDICATIONS:    Patient presents with ischemic rest pain of the left lower extremity.  Were able to get her SFA stents and tibials open, but she has significant common femoral disease and the origin of the profunda femoris artery as an inflow lesion that needs to be addressed.  Left femoral endarterectomy is planned to try to improve perfusion.  The risks and benefits as well as alternative therapies including intervention were reviewed in detail all questions were answered the patient agrees to proceed with surgery.Co surgeons are used due to the complexity of the procedure.  DESCRIPTION: After obtaining full informed written consent, the patient was brought back to the operating room and placed supine upon the operating table.  The patient received IV antibiotics prior to induction.  After obtaining adequate anesthesia, the patient was prepped and draped in the standard fashion appropriate time out is called.    Vertical incision was created overlying the left femoral arteries. The common femoral artery proximally, and superficial femoral artery, and primary profunda femoris artery branches were encircled with vessel loops and prepared for control. The left femoral arteries were found to have significant plaque from the common femoral artery into the profunda and superficial femoral arteries.   5000 units of heparin was given and  allowed circulate for 5 minutes.   Attention is then turned to the left femoral artery.  An arteriotomy is made with 11 blade and extended with Potts scissors in the common femoral artery and carried down onto the first 1-2 cm of the superficial femoral artery down to the previously placed stent. An endarterectomy was then performed. The [facility] was used to create a plane. The proximal endpoint was cut flush with tenotomy scissors. This was in the proximal common femoral artery. An eversion endarterectomy was then performed for the first 2-3 cm of the main profunda femorus artery where there was a large tail of plaque that had been seen on the previous angiogram that was impeding flow. Good backbleeding was then seen. The distal endpoint of the superficial femoral endarterectomy was created with gentle traction and the distal endpoint was clean at the end of the previously placed stents which was tacked down with a pair of 6-0 Prolene sutures.  The Cormatrix patcth is then selected and prepared for a patch angioplasty.  It is cut and beveled and started at the proximal endpoint with a 6-0 Prolene suture.  Approximately one half of the suture line is run medially and laterally and the distal end point was cut and bevelled to match the arteriotomy.  A second 6-0 Prolene was started at the distal end point and run to the mid portion to complete the arteriotomy.  The vessel was flushed prior to release of control and completion of the anastomosis.  At this point, flow was established first to the profunda femoris artery and then to the superficial femoral artery. Easily palpable pulses are noted well beyond the anastomosis and both arteries.  Surgicel and Evicel topical hemostatic agents were placed in the femoral incision and hemostasis was complete. The femoral incision was then closed in a layered fashion with 2 layers of 2-0 Vicryl, 2 layers of 3-0 Vicryl, and 4-0 Monocryl for the skin closure. Prevena  Incisional wound vac were then placed over the incision.  The patient was then awakened from anesthesia and taken to the recovery room in stable condition having tolerated the procedure well.  COMPLICATIONS: None  CONDITION: Stable     [name] 10/16/2021 9:40 AM   This note was created with Dragon Medical transcription system. Any errors in dictation are purely unintentional.

## 2021-10-16 NOTE — Interval H&P Note (Signed)
History and Physical Interval Note:  10/16/2021 7:32 AM  Sylvia [name]  has presented today for surgery, with the diagnosis of N/A.  The various methods of treatment have been discussed with the patient and family. After consideration of risks, benefits and other options for treatment, the patient has consented to  Procedure(s): ENDARTERECTOMY FEMORAL (Left) APPLICATION OF CELL SAVER (N/A) as a surgical intervention.  The patient's history has been reviewed, patient examined, no change in status, stable for surgery.  I have reviewed the patient's chart and labs.  Questions were answered to the patient's satisfaction.     [name]

## 2021-10-16 NOTE — Progress Notes (Signed)
Pt transported to vascular lab, vs wnl, no distress noted, on RA and tele.

## 2021-10-16 NOTE — H&P (View-Only) (Signed)
[facility]  Daily Progress Note   Subjective  -   Sylvia [name] is a 66 year old female who recently underwent revascularization attempts on her left lower extremity on 10/14/2021.  Revascularization attempts were unsuccessful and it was noted that there was significant disease of the common femoral artery as well as profunda femoris.  Despite revascularization the patient continued to have no flow into the SFA and popliteal stents distally.  There was some mild reconstruction of the posterior tibial artery distally.  Since her procedure yesterday the patient has some soreness but she notes that her leg does feel improved.  She has been on heparin drip and tolerating well.  The patient previously had a GI bleed following thrombolytic therapy.  She denies any issues with melena since her procedures.  The patient's vitals have been stable and her hemoglobin is currently stable however the patient has received a unit of blood.  Objective Vitals:   10/15/21 2100 10/15/21 2200 10/15/21 2300 10/16/21 0000  BP: (!) 141/71 (!) 119/56 (!) 145/78 (!) 96/59  Pulse: (!) 106 100 (!) 104 (!) 107  Resp: (!) 21 18 19 18   Temp:    98.3 F (36.8 C)  TempSrc:    Oral  SpO2: 96% 98% 99% 94%  Weight:      Height:        Intake/Output Summary (Last 24 hours) at 10/16/2021 0050 Last data filed at 10/16/2021 0000 Gross per 24 hour  Intake 950.46 ml  Output 1745 ml  Net -794.54 ml    PULM  CTAB CV  RRR VASC  left foot warm with minimal edema  Laboratory CBC    Component Value Date/Time   WBC 9.2 10/15/2021 0340   HGB 8.7 (L) 10/15/2021 1308   HGB 8.1 (L) 05/09/2019 1539   HCT 26.8 (L) 10/15/2021 1308   HCT 24.6 (L) 05/09/2019 1539   PLT 196 10/15/2021 0340   PLT 470 (H) 05/09/2019 1539    BMET    Component Value Date/Time   NA 138 10/15/2021 0340   K 4.1 10/15/2021 0340   CL 112 (H) 10/15/2021 0340   CO2 23 10/15/2021 0340   GLUCOSE 310 (H) 10/15/2021 0340    BUN 23 10/15/2021 0340   CREATININE 1.10 (H) 10/15/2021 0340   CALCIUM 8.5 (L) 10/15/2021 0340   GFRNONAA 55 (L) 10/15/2021 0340   GFRAA >60 11/23/2019 1505    Assessment/Planning: POD #1 s/p left lower extremity angiogram  The patient will have upcoming left femoral endarterectomy tomorrow.  Patient is currently resting comfortably on heparin drip.  We will plan to hold heparin drip at 6 AM.  Patient is still ready to proceed with surgery.   01/23/2020  10/16/2021, 12:50 AM

## 2021-10-17 LAB — CBC
HCT: 22.3 % — ABNORMAL LOW (ref 36.0–46.0)
HCT: 25.3 % — ABNORMAL LOW (ref 36.0–46.0)
Hemoglobin: 7.3 g/dL — ABNORMAL LOW (ref 12.0–15.0)
Hemoglobin: 8.2 g/dL — ABNORMAL LOW (ref 12.0–15.0)
MCH: 28.6 pg (ref 26.0–34.0)
MCH: 28.1 pg (ref 26.0–34.0)
MCHC: 32.7 g/dL (ref 30.0–36.0)
MCHC: 32.4 g/dL (ref 30.0–36.0)
MCV: 87.5 fL (ref 80.0–100.0)
MCV: 86.6 fL (ref 80.0–100.0)
Platelets: 213 10*3/uL (ref 150–400)
Platelets: 241 10*3/uL (ref 150–400)
RBC: 2.55 MIL/uL — ABNORMAL LOW (ref 3.87–5.11)
RBC: 2.92 MIL/uL — ABNORMAL LOW (ref 3.87–5.11)
RDW: 14.4 % (ref 11.5–15.5)
RDW: 14.6 % (ref 11.5–15.5)
WBC: 12.9 10*3/uL — ABNORMAL HIGH (ref 4.0–10.5)
WBC: 12.3 10*3/uL — ABNORMAL HIGH (ref 4.0–10.5)
nRBC: 0 % (ref 0.0–0.2)
nRBC: 0 % (ref 0.0–0.2)

## 2021-10-17 LAB — GLUCOSE, CAPILLARY
Glucose-Capillary: 256 mg/dL — ABNORMAL HIGH (ref 70–99)
Glucose-Capillary: 270 mg/dL — ABNORMAL HIGH (ref 70–99)
Glucose-Capillary: 276 mg/dL — ABNORMAL HIGH (ref 70–99)
Glucose-Capillary: 197 mg/dL — ABNORMAL HIGH (ref 70–99)

## 2021-10-17 LAB — RENAL FUNCTION PANEL
Albumin: 3 g/dL — ABNORMAL LOW (ref 3.5–5.0)
Anion gap: 5 (ref 5–15)
BUN: 18 mg/dL (ref 8–23)
CO2: 25 mmol/L (ref 22–32)
Calcium: 8.6 mg/dL — ABNORMAL LOW (ref 8.9–10.3)
Chloride: 108 mmol/L (ref 98–111)
Creatinine, Ser: 0.84 mg/dL (ref 0.44–1.00)
GFR, Estimated: >60 mL/min (ref >60)
Glucose, Bld: 231 mg/dL — ABNORMAL HIGH (ref 70–99)
Phosphorus: 3.8 mg/dL (ref 2.5–4.6)
Potassium: 3.9 mmol/L (ref 3.5–5.1)
Sodium: 138 mmol/L (ref 135–145)

## 2021-10-17 LAB — HEPARIN LEVEL (UNFRACTIONATED): Heparin Unfractionated: 0.53 IU/mL (ref 0.30–0.70)

## 2021-10-17 NOTE — Progress Notes (Signed)
ANTICOAGULATION CONSULT NOTE  Pharmacy Consult for heparin monitoring Indication: VTE prophylaxis  Allergies  Allergen Reactions   Vancomycin Rash    Patient developed a rash to injection site and arm a few mintes after starting ABX.    Hydrocodone Rash   Metformin And Related Diarrhea   Penicillins Hives    Has patient had a PCN reaction causing immediate rash, facial/tongue/throat swelling, SOB or lightheadedness with hypotension: Yes Has patient had a PCN reaction causing severe rash involving mucus membranes or skin necrosis: No Has patient had a PCN reaction that required hospitalization: No Has patient had a PCN reaction occurring within the last 10 years: No If all of the above answers are "NO", then may proceed with Cephalosporin use.   Tramadol Itching    Patient Measurements: Height: 5\' 7"  (170.2 cm) Weight: 63.5 kg (139 lb 15.9 oz) IBW/kg (Calculated) : 61.6 Heparin Dosing Weight: 63.5 kg  Vital Signs: Temp: 98.9 F (37.2 C) (07/01 2000) Temp Source: Oral (07/01 2000) BP: 115/64 (07/01 1800) Pulse Rate: 100 (07/01 1800)  Labs: Recent Labs    10/15/21 0340 10/15/21 1308 10/16/21 0354 10/17/21 0351 10/17/21 1558 10/17/21 2024  HGB 7.0* 8.7* 8.1* 7.3* 8.2*  --   HCT 21.7* 26.8* 24.8* 22.3* 25.3*  --   PLT 196  --  203 213 241  --   HEPARINUNFRC  --  0.11*  --   --   --  0.53  CREATININE 1.10*  --  0.68 0.84  --   --      Estimated Creatinine Clearance: 64.1 mL/min (by C-G formula based on SCr of 0.84 mg/dL).   Medical History: Past Medical History:  Diagnosis Date   Anemia of chronic disease 05/16/2019   Anxiety    h/o   COPD (chronic obstructive pulmonary disease) (HCC)    Diabetes mellitus without complication (HCC)    GERD (gastroesophageal reflux disease)    Hypertension    bp under control-off meds since 2019    Medications:  Scheduled:   Chlorhexidine Gluconate Cloth  6 each Topical Q0600   gabapentin  600 mg Oral Q breakfast   And    gabapentin  900 mg Oral QHS   insulin aspart  0-15 Units Subcutaneous TID WC   insulin aspart  0-5 Units Subcutaneous QHS   insulin glargine-yfgn  10 Units Subcutaneous QHS   lisinopril  10 mg Oral Daily   multivitamin with minerals  1 tablet Oral Daily   Ensure Max Protein  11 oz Oral BID   sodium chloride flush  3 mL Intravenous Q12H    Assessment: 66 year old female with hx of DM, PVD, chronic pain related to both and prior RLE BKA.  She presented to [facility] ER on 06/27 for left lower extremity angiogram with angioplasty and TPA to infuse overnight in the ICU with return to OR on 06/28 now POD #1, s/p left femoral endarterectomy with patch. H&H trending down  Goal of Therapy:  Heparin level 0.3-0.7 units/ml Monitor platelets by anticoagulation protocol: Yes   Date/Time aPTT/HL Comments 7/1@2024  HL 0.53 Therap x 1 @ 1250 un/hr   Plan:  Continue heparin infusion at 1250 units/hr Check anti-Xa level in 6 hours to confirm level Continue to monitor H&H and platelets  Ieshia Hatcher Rodriguez-Guzman PharmD, BCPS 10/17/2021 9:24 PM

## 2021-10-17 NOTE — Progress Notes (Addendum)
    Subjective  - POD #1, s/p left femoral endarterectomy with patch  C/o groin pain Left foot fees better   Physical Exam:  Left foot edema Left foot is warm with intact motor and sensory function Provena with good seal      Assessment/Plan:  POD #1  Acute blood loss anemia:  Hb 7.3 this am, will monitor, no need for transfusion currently D/c femoral TLC D/c foley PT eval Transfer to floor Transition to Eliquis prior to d/c home on MOnday.  Pharmacy to dose heparin to theraputic levels  Wells Aqsa Sensabaugh 10/17/2021 12:28 PM --  Vitals:   10/17/21 1100 10/17/21 1200  BP: 123/67 115/63  Pulse: 98 94  Resp: 20 17  Temp:  99 F (37.2 C)  SpO2: 98% 96%    Intake/Output Summary (Last 24 hours) at 10/17/2021 1228 Last data filed at 10/17/2021 1128 Gross per 24 hour  Intake 500.8 ml  Output 1475 ml  Net -974.2 ml     Laboratory CBC    Component Value Date/Time   WBC 12.9 (H) 10/17/2021 0351   HGB 7.3 (L) 10/17/2021 0351   HGB 8.1 (L) 05/09/2019 1539   HCT 22.3 (L) 10/17/2021 0351   HCT 24.6 (L) 05/09/2019 1539   PLT 213 10/17/2021 0351   PLT 470 (H) 05/09/2019 1539    BMET    Component Value Date/Time   NA 138 10/17/2021 0351   K 3.9 10/17/2021 0351   CL 108 10/17/2021 0351   CO2 25 10/17/2021 0351   GLUCOSE 231 (H) 10/17/2021 0351   BUN 18 10/17/2021 0351   CREATININE 0.84 10/17/2021 0351   CALCIUM 8.6 (L) 10/17/2021 0351   GFRNONAA >60 10/17/2021 0351   GFRAA >60 11/23/2019 1505    COAG Lab Results  Component Value Date   INR 1.3 (H) 09/21/2021   INR 1.1 12/25/2020   INR 1.0 07/25/2019   No results found for: "PTT"  Antibiotics Anti-infectives (From admission, onward)    Start     Dose/Rate Route Frequency Ordered Stop   10/17/21 0600  ciprofloxacin (CIPRO) IVPB 400 mg  Status:  Discontinued        400 mg 200 mL/hr over 60 Minutes Intravenous On call to O.R. 10/16/21 1122 10/17/21 0531   10/14/21 1040  ciprofloxacin (CIPRO) IVPB  Status:   Discontinued        over 60 Minutes  Continuous PRN 10/14/21 1041 10/14/21 1145   10/14/21 0919  ciprofloxacin (CIPRO) IVPB 400 mg  Status:  Discontinued        400 mg 200 mL/hr over 60 Minutes Intravenous 60 min pre-op 10/14/21 0919 10/14/21 1145   10/13/21 0020  ciprofloxacin (CIPRO) IVPB 400 mg        400 mg 200 mL/hr over 60 Minutes Intravenous 60 min pre-op 10/13/21 0020 10/13/21 1430        V. Charlena Cross, M.D., [facility] [place] Office: [phone] Pager:  [phone]

## 2021-10-17 NOTE — Progress Notes (Signed)
Patient's pain is well managed with prn pain medication and is currently resting in bed. Continues with negative pressure dressing that is clean, dry, intact.  Right femoral TLC remains clean; dry; intact, and patent.  Patient is able to verbalize needs, will continue to monitor patient.

## 2021-10-17 NOTE — Progress Notes (Signed)
ANTICOAGULATION CONSULT NOTE  Pharmacy Consult for heparin monitoring Indication: VTE prophylaxis  Allergies  Allergen Reactions   Vancomycin Rash    Patient developed a rash to injection site and arm a few mintes after starting ABX.    Hydrocodone Rash   Metformin And Related Diarrhea   Penicillins Hives    Has patient had a PCN reaction causing immediate rash, facial/tongue/throat swelling, SOB or lightheadedness with hypotension: Yes Has patient had a PCN reaction causing severe rash involving mucus membranes or skin necrosis: No Has patient had a PCN reaction that required hospitalization: No Has patient had a PCN reaction occurring within the last 10 years: No If all of the above answers are "NO", then may proceed with Cephalosporin use.   Tramadol Itching    Patient Measurements: Height: 5\' 7"  (170.2 cm) Weight: 63.5 kg (139 lb 15.9 oz) IBW/kg (Calculated) : 61.6 Heparin Dosing Weight: 63.5 kg  Vital Signs: Temp: 99 F (37.2 C) (07/01 1200) Temp Source: Oral (07/01 1200) BP: 115/63 (07/01 1200) Pulse Rate: 94 (07/01 1200)  Labs: Recent Labs    10/15/21 0340 10/15/21 1308 10/16/21 0354 10/17/21 0351  HGB 7.0* 8.7* 8.1* 7.3*  HCT 21.7* 26.8* 24.8* 22.3*  PLT 196  --  203 213  HEPARINUNFRC  --  0.11*  --   --   CREATININE 1.10*  --  0.68 0.84    Estimated Creatinine Clearance: 64.1 mL/min (by C-G formula based on SCr of 0.84 mg/dL).   Medical History: Past Medical History:  Diagnosis Date   Anemia of chronic disease 05/16/2019   Anxiety    h/o   COPD (chronic obstructive pulmonary disease) (HCC)    Diabetes mellitus without complication (HCC)    GERD (gastroesophageal reflux disease)    Hypertension    bp under control-off meds since 2019    Medications:  Scheduled:   Chlorhexidine Gluconate Cloth  6 each Topical Q0600   gabapentin  600 mg Oral Q breakfast   And   gabapentin  900 mg Oral QHS   insulin aspart  0-15 Units Subcutaneous TID WC    insulin aspart  0-5 Units Subcutaneous QHS   insulin glargine-yfgn  10 Units Subcutaneous QHS   lisinopril  10 mg Oral Daily   multivitamin with minerals  1 tablet Oral Daily   Ensure Max Protein  11 oz Oral BID   sodium chloride flush  3 mL Intravenous Q12H    Assessment: 66 year old female with hx of DM, PVD, chronic pain related to both and prior RLE BKA.  She presented to [facility] ER on 06/27 for left lower extremity angiogram with angioplasty and TPA to infuse overnight in the ICU with return to OR on 06/28 now POD #1, s/p left femoral endarterectomy with patch. H&H trending down  Goal of Therapy:  Heparin level 0.3-0.7 units/ml Monitor platelets by anticoagulation protocol: Yes   Plan:  Give 4000 units bolus x 1 Start heparin infusion at 1250 units/hr Check anti-Xa level in 6 hours and daily while on heparin Continue to monitor H&H and platelets  7/28 10/17/2021,12:53 PM

## 2021-10-17 NOTE — Progress Notes (Signed)
Right femoral central triple lumen catheter removed with all parts intact, pressure applied after removal and then pressure dressing applied.  Patient tolerated well.  Foley removed with all parts intact, patient tolerated well.

## 2021-10-17 NOTE — Plan of Care (Signed)
Continuing with plan of care. 

## 2021-10-18 ENCOUNTER — Inpatient Hospital Stay: Payer: Medicare Other

## 2021-10-18 LAB — HEPARIN LEVEL (UNFRACTIONATED)
Heparin Unfractionated: 0.7 IU/mL (ref 0.30–0.70)
Heparin Unfractionated: 0.82 IU/mL — ABNORMAL HIGH (ref 0.30–0.70)
Heparin Unfractionated: 0.62 IU/mL (ref 0.30–0.70)

## 2021-10-18 LAB — CBC
HCT: 24.5 % — ABNORMAL LOW (ref 36.0–46.0)
Hemoglobin: 8.1 g/dL — ABNORMAL LOW (ref 12.0–15.0)
MCH: 28.4 pg (ref 26.0–34.0)
MCHC: 33.1 g/dL (ref 30.0–36.0)
MCV: 86 fL (ref 80.0–100.0)
Platelets: 241 10*3/uL (ref 150–400)
RBC: 2.85 MIL/uL — ABNORMAL LOW (ref 3.87–5.11)
RDW: 14.5 % (ref 11.5–15.5)
WBC: 13.2 10*3/uL — ABNORMAL HIGH (ref 4.0–10.5)
nRBC: 0.2 % (ref 0.0–0.2)

## 2021-10-18 LAB — RENAL FUNCTION PANEL
Albumin: 3 g/dL — ABNORMAL LOW (ref 3.5–5.0)
Anion gap: 6 (ref 5–15)
BUN: 21 mg/dL (ref 8–23)
CO2: 24 mmol/L (ref 22–32)
Calcium: 9.2 mg/dL (ref 8.9–10.3)
Chloride: 106 mmol/L (ref 98–111)
Creatinine, Ser: 0.85 mg/dL (ref 0.44–1.00)
GFR, Estimated: >60 mL/min (ref >60)
Glucose, Bld: 221 mg/dL — ABNORMAL HIGH (ref 70–99)
Phosphorus: 4.2 mg/dL (ref 2.5–4.6)
Potassium: 4.2 mmol/L (ref 3.5–5.1)
Sodium: 136 mmol/L (ref 135–145)

## 2021-10-18 LAB — SARS CORONAVIRUS 2 BY RT PCR: SARS Coronavirus 2 by RT PCR: NEGATIVE

## 2021-10-18 LAB — GLUCOSE, CAPILLARY
Glucose-Capillary: 223 mg/dL — ABNORMAL HIGH (ref 70–99)
Glucose-Capillary: 173 mg/dL — ABNORMAL HIGH (ref 70–99)
Glucose-Capillary: 189 mg/dL — ABNORMAL HIGH (ref 70–99)
Glucose-Capillary: 170 mg/dL — ABNORMAL HIGH (ref 70–99)
Glucose-Capillary: 214 mg/dL — ABNORMAL HIGH (ref 70–99)

## 2021-10-18 MED ORDER — ACETAMINOPHEN 325 MG PO TABS
ORAL_TABLET | ORAL | Status: AC
  Filled 2021-10-18: qty 2

## 2021-10-18 MED ORDER — POLYETHYLENE GLYCOL 3350 17 G PO PACK
17.0000 g | PACK | Freq: Every day | ORAL | Status: DC
  Administered 2021-10-18 – 2021-10-19 (×2): 17 g via ORAL
  Filled 2021-10-18 (×3): qty 1

## 2021-10-18 MED ORDER — SENNOSIDES-DOCUSATE SODIUM 8.6-50 MG PO TABS: 1.0000 | ORAL_TABLET | Freq: Every evening | ORAL | Status: DC | PRN

## 2021-10-18 MED ORDER — ACETAMINOPHEN 325 MG PO TABS
650.0000 mg | ORAL_TABLET | Freq: Four times a day (QID) | ORAL | Status: DC | PRN
  Administered 2021-10-18: 650 mg via ORAL

## 2021-10-18 NOTE — Progress Notes (Signed)
CXR completed and Dr. Myra Gianotti notified, no new orders at this time, will continue to monitor patient.

## 2021-10-18 NOTE — Progress Notes (Signed)
Report given to receiving nurse on 1C, Chris, patient is being transferred on hospital bed, stable condition.

## 2021-10-18 NOTE — Progress Notes (Signed)
    Subjective  - POD #2, s/p left femoral endarterectomy and patch  Coughing, not feel as good today   Physical Exam:  Brisk pedal signals Vac with good seal       Assessment/Plan:  POD #2  Cough:  will check COVID, CXR Anemia:  Hb improved today Mirilax for bowel issues Transition to Eliquis  Sylvia [name] 10/18/2021 10:07 PM --  Vitals:   10/18/21 1620 10/18/21 2153  BP: (!) 113/98 (!) 106/59  Pulse: 97 (!) 105  Resp: 18 16  Temp: 98.3 F (36.8 C) 98.6 F (37 C)  SpO2: 100% 94%    Intake/Output Summary (Last 24 hours) at 10/18/2021 2207 Last data filed at 10/18/2021 1900 Gross per 24 hour  Intake 1330.96 ml  Output 2050 ml  Net -719.04 ml     Laboratory CBC    Component Value Date/Time   WBC 13.2 (H) 10/18/2021 0319   HGB 8.1 (L) 10/18/2021 0319   HGB 8.1 (L) 05/09/2019 1539   HCT 24.5 (L) 10/18/2021 0319   HCT 24.6 (L) 05/09/2019 1539   PLT 241 10/18/2021 0319   PLT 470 (H) 05/09/2019 1539    BMET    Component Value Date/Time   NA 136 10/18/2021 0319   K 4.2 10/18/2021 0319   CL 106 10/18/2021 0319   CO2 24 10/18/2021 0319   GLUCOSE 221 (H) 10/18/2021 0319   BUN 21 10/18/2021 0319   CREATININE 0.85 10/18/2021 0319   CALCIUM 9.2 10/18/2021 0319   GFRNONAA >60 10/18/2021 0319   GFRAA >60 11/23/2019 1505    COAG Lab Results  Component Value Date   INR 1.3 (H) 09/21/2021   INR 1.1 12/25/2020   INR 1.0 07/25/2019   No results found for: "PTT"  Antibiotics Anti-infectives (From admission, onward)    Start     Dose/Rate Route Frequency Ordered Stop   10/17/21 0600  ciprofloxacin (CIPRO) IVPB 400 mg  Status:  Discontinued        400 mg 200 mL/hr over 60 Minutes Intravenous On call to O.R. 10/16/21 1122 10/17/21 0531   10/14/21 1040  ciprofloxacin (CIPRO) IVPB  Status:  Discontinued        over 60 Minutes  Continuous PRN 10/14/21 1041 10/14/21 1145   10/14/21 0919  ciprofloxacin (CIPRO) IVPB 400 mg  Status:  Discontinued        400  mg 200 mL/hr over 60 Minutes Intravenous 60 min pre-op 10/14/21 0919 10/14/21 1145   10/13/21 0020  ciprofloxacin (CIPRO) IVPB 400 mg        400 mg 200 mL/hr over 60 Minutes Intravenous 60 min pre-op 10/13/21 0020 10/13/21 1430        V. Charlena Cross, M.D., [facility] [place] Office: [phone] Pager:  [phone]

## 2021-10-18 NOTE — Plan of Care (Signed)
Continuing with plan of care. 

## 2021-10-18 NOTE — Progress Notes (Signed)
ANTICOAGULATION CONSULT NOTE  Pharmacy Consult for heparin monitoring Indication: VTE prophylaxis  Allergies  Allergen Reactions   Vancomycin Rash    Patient developed a rash to injection site and arm a few mintes after starting ABX.    Hydrocodone Rash   Metformin And Related Diarrhea   Penicillins Hives    Has patient had a PCN reaction causing immediate rash, facial/tongue/throat swelling, SOB or lightheadedness with hypotension: Yes Has patient had a PCN reaction causing severe rash involving mucus membranes or skin necrosis: No Has patient had a PCN reaction that required hospitalization: No Has patient had a PCN reaction occurring within the last 10 years: No If all of the above answers are "NO", then may proceed with Cephalosporin use.   Tramadol Itching    Patient Measurements: Height: 5\' 7"  (170.2 cm) Weight: 63.5 kg (139 lb 15.9 oz) IBW/kg (Calculated) : 61.6 Heparin Dosing Weight: 63.5 kg  Vital Signs: Temp: 98.9 F (37.2 C) (07/01 2000) Temp Source: Oral (07/01 2000) BP: 110/64 (07/02 0300) Pulse Rate: 99 (07/02 0300)  Labs: Recent Labs    10/15/21 1308 10/15/21 1308 10/16/21 0354 10/17/21 0351 10/17/21 1558 10/17/21 2024 10/18/21 0319  HGB 8.7*  --  8.1* 7.3* 8.2*  --  8.1*  HCT 26.8*  --  24.8* 22.3* 25.3*  --  24.5*  PLT  --    < > 203 213 241  --  241  HEPARINUNFRC 0.11*  --   --   --   --  0.53 0.70  CREATININE  --   --  0.68 0.84  --   --  0.85   < > = values in this interval not displayed.     Estimated Creatinine Clearance: 63.3 mL/min (by C-G formula based on SCr of 0.85 mg/dL).   Medical History: Past Medical History:  Diagnosis Date   Anemia of chronic disease 05/16/2019   Anxiety    h/o   COPD (chronic obstructive pulmonary disease) (HCC)    Diabetes mellitus without complication (HCC)    GERD (gastroesophageal reflux disease)    Hypertension    bp under control-off meds since 2019    Medications:  Scheduled:   Chlorhexidine  Gluconate Cloth  6 each Topical Q0600   gabapentin  600 mg Oral Q breakfast   And   gabapentin  900 mg Oral QHS   insulin aspart  0-15 Units Subcutaneous TID WC   insulin aspart  0-5 Units Subcutaneous QHS   insulin glargine-yfgn  10 Units Subcutaneous QHS   lisinopril  10 mg Oral Daily   multivitamin with minerals  1 tablet Oral Daily   Ensure Max Protein  11 oz Oral BID    Assessment: 66 year old female with hx of DM, PVD, chronic pain related to both and prior RLE BKA.  She presented to [facility] ER on 06/27 for left lower extremity angiogram with angioplasty and TPA to infuse overnight in the ICU with return to OR on 06/28 now POD #1, s/p left femoral endarterectomy with patch. H&H trending down  Goal of Therapy:  Heparin level 0.3-0.7 units/ml Monitor platelets by anticoagulation protocol: Yes   Date/Time aPTT/HL Comments 7/1@2024  HL 0.53 Therap x 1 @ 1250 un/hr 7/2 0319 HL 0.70 Therapeutic x 2   Plan:  Continue heparin infusion at 1250 units/hr Will recheck HL at noon to confirm HL remains therapeutic Continue to monitor H&H and platelets  9/2, PharmD, [facility] 10/18/2021 4:27 AM

## 2021-10-18 NOTE — Progress Notes (Signed)
ANTICOAGULATION CONSULT NOTE  Pharmacy Consult for heparin monitoring Indication: VTE prophylaxis  Allergies  Allergen Reactions   Vancomycin Rash    Patient developed a rash to injection site and arm a few mintes after starting ABX.    Hydrocodone Rash   Metformin And Related Diarrhea   Penicillins Hives    Has patient had a PCN reaction causing immediate rash, facial/tongue/throat swelling, SOB or lightheadedness with hypotension: Yes Has patient had a PCN reaction causing severe rash involving mucus membranes or skin necrosis: No Has patient had a PCN reaction that required hospitalization: No Has patient had a PCN reaction occurring within the last 10 years: No If all of the above answers are "NO", then may proceed with Cephalosporin use.   Tramadol Itching    Patient Measurements: Height: 5\' 7"  (170.2 cm) Weight: 63.5 kg (139 lb 15.9 oz) IBW/kg (Calculated) : 61.6 Heparin Dosing Weight: 63.5 kg  Vital Signs: Temp: 98.3 F (36.8 C) (07/02 1620) Temp Source: Oral (07/02 1200) BP: 113/98 (07/02 1620) Pulse Rate: 97 (07/02 1620)  Labs: Recent Labs    10/16/21 0354 10/17/21 0351 10/17/21 1558 10/17/21 2024 10/18/21 0319 10/18/21 1216 10/18/21 1853  HGB 8.1* 7.3* 8.2*  --  8.1*  --   --   HCT 24.8* 22.3* 25.3*  --  24.5*  --   --   PLT 203 213 241  --  241  --   --   HEPARINUNFRC  --   --   --    < > 0.70 0.82* 0.62  CREATININE 0.68 0.84  --   --  0.85  --   --    < > = values in this interval not displayed.     Estimated Creatinine Clearance: 63.3 mL/min (by C-G formula based on SCr of 0.85 mg/dL).   Medical History: Past Medical History:  Diagnosis Date   Anemia of chronic disease 05/16/2019   Anxiety    h/o   COPD (chronic obstructive pulmonary disease) (HCC)    Diabetes mellitus without complication (HCC)    GERD (gastroesophageal reflux disease)    Hypertension    bp under control-off meds since 2019    Medications:  Scheduled:   acetaminophen        Chlorhexidine Gluconate Cloth  6 each Topical Q0600   gabapentin  600 mg Oral Q breakfast   And   gabapentin  900 mg Oral QHS   insulin aspart  0-15 Units Subcutaneous TID WC   insulin aspart  0-5 Units Subcutaneous QHS   insulin glargine-yfgn  10 Units Subcutaneous QHS   lisinopril  10 mg Oral Daily   multivitamin with minerals  1 tablet Oral Daily   Ensure Max Protein  11 oz Oral BID    Assessment: 66 year old female with hx of DM, PVD, chronic pain related to both and prior RLE BKA.  She presented to [facility] ER on 06/27 for left lower extremity angiogram with angioplasty and TPA to infuse overnight in the ICU with return to OR on 06/28 now POD #1, s/p left femoral endarterectomy with patch  Goal of Therapy:  Heparin level 0.3-0.7 units/ml Monitor platelets by anticoagulation protocol: Yes   Date/Time aPTT/HL Comments 7/1@2024  HL 0.53 Therap x 1 @ 1250 un/hr 7/2 0319 HL 0.70 Therapeutic x 2 7/2@1225  HL 0.82 Suprathera, decrease rate 7/2@1853  HL 0.62 Therapeutic x 1 ,1150un/hr  Plan:  Continue heparin infusion at 1150 units/hr Will recheck HL in 6 hrs to confirm level Continue to  monitor H&H and platelets  Dellie Piasecki Rodriguez-Guzman PharmD, BCPS 10/18/2021 7:38 PM

## 2021-10-18 NOTE — Progress Notes (Signed)
COVID test negative; Dr. Myra Gianotti notified; airborne isolation discontinued.

## 2021-10-18 NOTE — Progress Notes (Signed)
ANTICOAGULATION CONSULT NOTE  Pharmacy Consult for heparin monitoring Indication: VTE prophylaxis  Allergies  Allergen Reactions   Vancomycin Rash    Patient developed a rash to injection site and arm a few mintes after starting ABX.    Hydrocodone Rash   Metformin And Related Diarrhea   Penicillins Hives    Has patient had a PCN reaction causing immediate rash, facial/tongue/throat swelling, SOB or lightheadedness with hypotension: Yes Has patient had a PCN reaction causing severe rash involving mucus membranes or skin necrosis: No Has patient had a PCN reaction that required hospitalization: No Has patient had a PCN reaction occurring within the last 10 years: No If all of the above answers are "NO", then may proceed with Cephalosporin use.   Tramadol Itching    Patient Measurements: Height: 5\' 7"  (170.2 cm) Weight: 63.5 kg (139 lb 15.9 oz) IBW/kg (Calculated) : 61.6 Heparin Dosing Weight: 63.5 kg  Vital Signs: Temp: 100.2 F (37.9 C) (07/02 1200) Temp Source: Oral (07/02 1200) BP: 126/67 (07/02 1200) Pulse Rate: 105 (07/02 1200)  Labs: Recent Labs    10/16/21 0354 10/17/21 0351 10/17/21 1558 10/17/21 2024 10/18/21 0319 10/18/21 1216  HGB 8.1* 7.3* 8.2*  --  8.1*  --   HCT 24.8* 22.3* 25.3*  --  24.5*  --   PLT 203 213 241  --  241  --   HEPARINUNFRC  --   --   --  0.53 0.70 0.82*  CREATININE 0.68 0.84  --   --  0.85  --      Estimated Creatinine Clearance: 63.3 mL/min (by C-G formula based on SCr of 0.85 mg/dL).   Medical History: Past Medical History:  Diagnosis Date   Anemia of chronic disease 05/16/2019   Anxiety    h/o   COPD (chronic obstructive pulmonary disease) (HCC)    Diabetes mellitus without complication (HCC)    GERD (gastroesophageal reflux disease)    Hypertension    bp under control-off meds since 2019    Medications:  Scheduled:   acetaminophen       Chlorhexidine Gluconate Cloth  6 each Topical Q0600   gabapentin  600 mg Oral Q  breakfast   And   gabapentin  900 mg Oral QHS   insulin aspart  0-15 Units Subcutaneous TID WC   insulin aspart  0-5 Units Subcutaneous QHS   insulin glargine-yfgn  10 Units Subcutaneous QHS   lisinopril  10 mg Oral Daily   multivitamin with minerals  1 tablet Oral Daily   Ensure Max Protein  11 oz Oral BID    Assessment: 66 year old female with hx of DM, PVD, chronic pain related to both and prior RLE BKA.  She presented to [facility] ER on 06/27 for left lower extremity angiogram with angioplasty and TPA to infuse overnight in the ICU with return to OR on 06/28 now POD #1, s/p left femoral endarterectomy with patch  Goal of Therapy:  Heparin level 0.3-0.7 units/ml Monitor platelets by anticoagulation protocol: Yes   Date/Time aPTT/HL Comments 7/1@2024  HL 0.53 Therap x 1 @ 1250 un/hr 7/2 0319 HL 0.70 Therapeutic x 2 7/2@1225  HL 0.82 Suprathera, decrease rate from 1250 to 1150 unit/hr   Plan:  7/2@1225   HL 0.82  Supratherapeutic Decrease heparin infusion at 1150 units/hr Will recheck HL in 6 hrs Continue to monitor H&H and platelets  7/28 PharmD Clinical Pharmacist 10/18/2021

## 2021-10-18 NOTE — Evaluation (Signed)
Physical Therapy Evaluation Patient Details Name: Sylvia [name] MRN: [id number] DOB: 1956/01/27 Today's Date: 10/18/2021  History of Present Illness  Pt is a 66 y/o F admitted on 10/13/21 for LLE angiogram & angioplasty. Pt also underwent L femoral endarterectomy on 10/16/21. PMH: DM, PVD, chronic pain related to both & prior RLE BKA  Clinical Impression  Pt seen for PT evaluation with pt agreeable but endorsing "something's not right" and not feeling well -- nurse notified of pt's c/o & BP but cleared pt to get OOB. Pt requires mod assist for supine>sit & min fade to supervision for lateral scoot bed>drop arm recliner. Pt fatigued after mobility but pleased to be up in recliner. Pt's BP noted to increase & nurse notified. At this time, will recommend STR upon d/c & pt agreeable.  BP in LUE: Supine: 95/71 mmHg (MAP 81) Sitting: 150/73 mmHg (MAP 95) Sitting in recliner: 168/121 mmHg (MAP 137)      Recommendations for follow up therapy are one component of a multi-disciplinary discharge planning process, led by the attending physician.  Recommendations may be updated based on patient status, additional functional criteria and insurance authorization.  Follow Up Recommendations Skilled nursing-short term rehab (<3 hours/day) Can patient physically be transported by private vehicle: No    Assistance Recommended at Discharge Frequent or constant Supervision/Assistance  Patient can return home with the following  A lot of help with walking and/or transfers;A lot of help with bathing/dressing/bathroom;Assistance with cooking/housework;Assist for transportation;Help with stairs or ramp for entrance    Equipment Recommendations None recommended by PT  Recommendations for Other Services  OT consult    Functional Status Assessment Patient has had a recent decline in their functional status and demonstrates the ability to make significant improvements in function in a reasonable and predictable  amount of time.     Precautions / Restrictions Precautions Precautions: Fall Precaution Comments: R BKA with prosthesis Restrictions Weight Bearing Restrictions: No      Mobility  Bed Mobility Overal bed mobility: Needs Assistance Bed Mobility: Supine to Sit     Supine to sit: Mod assist, HOB elevated     General bed mobility comments: HOB elevated, holds to PT's hand for stabilization/to pull herself upright    Transfers Overall transfer level: Needs assistance Equipment used: None Transfers: Bed to chair/wheelchair/BSC            Lateral/Scoot Transfers: Min assist, Supervision (min assist fade to supervision) General transfer comment: cuing for hand placement, head/hips relationship & initially blocking L knee but pt then able to perform with supervision    Ambulation/Gait                  Stairs            Wheelchair Mobility    Modified Rankin (Stroke Patients Only)       Balance Overall balance assessment: Needs assistance Sitting-balance support: Feet supported, Bilateral upper extremity supported Sitting balance-Leahy Scale: Fair                                       Pertinent Vitals/Pain Pain Assessment Pain Assessment: Faces Faces Pain Scale: Hurts little more Pain Location: L lower side near hip Pain Descriptors / Indicators: Discomfort Pain Intervention(s): Monitored during session    Home Living   Living Arrangements: Spouse/significant other Available Help at Discharge: Family;Friend(s);Available 24 hours/day Type of Home: House Home Access: Ramped  entrance       Home Layout: One level Home Equipment: Cane - single point;Hand held shower head;Wheelchair - Careers adviser (comment);Rolling Walker (2 wheels)      Prior Function Prior Level of Function : Independent/Modified Independent             Mobility Comments: Pt has been ambulating with prosthesis & RW since last admission.       Hand  Dominance        Extremity/Trunk Assessment   Upper Extremity Assessment Upper Extremity Assessment: Generalized weakness    Lower Extremity Assessment Lower Extremity Assessment: RLE deficits/detail;Generalized weakness;LLE deficits/detail RLE Deficits / Details: hx of R BKA LLE Deficits / Details: wound vac intact to L groin       Communication   Communication: No difficulties  Cognition Arousal/Alertness: Awake/alert Behavior During Therapy: WFL for tasks assessed/performed Overall Cognitive Status: Within Functional Limits for tasks assessed                                          General Comments      Exercises     Assessment/Plan    PT Assessment Patient needs continued PT services  PT Problem List Decreased activity tolerance;Decreased balance;Decreased mobility;Decreased strength;Decreased range of motion;Decreased knowledge of use of DME;Decreased safety awareness;Cardiopulmonary status limiting activity       PT Treatment Interventions DME instruction;Gait training;Functional mobility training;Therapeutic activities;Therapeutic exercise;Balance training;Neuromuscular re-education;Patient/family education    PT Goals (Current goals can be found in the Care Plan section)  Acute Rehab PT Goals Patient Stated Goal: feel better PT Goal Formulation: With patient Time For Goal Achievement: 11/01/21 Potential to Achieve Goals: Good    Frequency Min 2X/week     Co-evaluation               AM-PAC PT "6 Clicks" Mobility  Outcome Measure Help needed turning from your back to your side while in a flat bed without using bedrails?: A Little Help needed moving from lying on your back to sitting on the side of a flat bed without using bedrails?: A Lot Help needed moving to and from a bed to a chair (including a wheelchair)?: A Little Help needed standing up from a chair using your arms (e.g., wheelchair or bedside chair)?: A Lot Help needed  to walk in hospital room?: A Lot Help needed climbing 3-5 steps with a railing? : Total 6 Click Score: 13    End of Session   Activity Tolerance: Patient tolerated treatment well (limited by not feeling wel) Patient left: in chair;with call bell/phone within reach Nurse Communication: Mobility status (notified nurse of BP) PT Visit Diagnosis: Muscle weakness (generalized) (M62.81);Difficulty in walking, not elsewhere classified (R26.2);Other abnormalities of gait and mobility (R26.89);Unsteadiness on feet (R26.81)    Time: 6195-0932 PT Time Calculation (min) (ACUTE ONLY): 18 min   Charges:   PT Evaluation $PT Eval Moderate Complexity: 1 Mod          Aleda Grana, PT, DPT 10/18/21, 12:47 PM   Sandi Mariscal 10/18/2021, 12:45 PM

## 2021-10-18 NOTE — Progress Notes (Signed)
At noon patient stated that she was not feeling good stating that she felt weak and tired.  Temperature 100.2, patient with dry cough, and crackles ausculted to the lower lungs.  Dr. Myra Gianotti notified and came to bedside. Dr. Myra Gianotti ordered Tylenol 650mg  prn for fever, CXR, and COVID swab.  Patient has request medication for constipation, Dr. ordered senokot 8.6mg  prn as well.

## 2021-10-19 LAB — TYPE AND SCREEN
ABO/RH(D): O POS
Antibody Screen: NEGATIVE
Unit division: 0
Unit division: 0
Unit division: 0

## 2021-10-19 LAB — BPAM RBC
Blood Product Expiration Date: 202307262359
Blood Product Expiration Date: 202308032359
Blood Product Expiration Date: 202308032359
ISSUE DATE / TIME: 202306290614
Unit Type and Rh: 5100
Unit Type and Rh: 5100
Unit Type and Rh: 5100

## 2021-10-19 LAB — RENAL FUNCTION PANEL
Albumin: 3 g/dL — ABNORMAL LOW (ref 3.5–5.0)
Anion gap: 9 (ref 5–15)
BUN: 20 mg/dL (ref 8–23)
CO2: 24 mmol/L (ref 22–32)
Calcium: 9 mg/dL (ref 8.9–10.3)
Chloride: 102 mmol/L (ref 98–111)
Creatinine, Ser: 0.76 mg/dL (ref 0.44–1.00)
GFR, Estimated: >60 mL/min (ref >60)
Glucose, Bld: 218 mg/dL — ABNORMAL HIGH (ref 70–99)
Phosphorus: 4.3 mg/dL (ref 2.5–4.6)
Potassium: 4.1 mmol/L (ref 3.5–5.1)
Sodium: 135 mmol/L (ref 135–145)

## 2021-10-19 LAB — GLUCOSE, CAPILLARY
Glucose-Capillary: 199 mg/dL — ABNORMAL HIGH (ref 70–99)
Glucose-Capillary: 214 mg/dL — ABNORMAL HIGH (ref 70–99)
Glucose-Capillary: 267 mg/dL — ABNORMAL HIGH (ref 70–99)
Glucose-Capillary: 166 mg/dL — ABNORMAL HIGH (ref 70–99)

## 2021-10-19 LAB — CBC
HCT: 24.9 % — ABNORMAL LOW (ref 36.0–46.0)
Hemoglobin: 8.1 g/dL — ABNORMAL LOW (ref 12.0–15.0)
MCH: 28.3 pg (ref 26.0–34.0)
MCHC: 32.5 g/dL (ref 30.0–36.0)
MCV: 87.1 fL (ref 80.0–100.0)
Platelets: 267 10*3/uL (ref 150–400)
RBC: 2.86 MIL/uL — ABNORMAL LOW (ref 3.87–5.11)
RDW: 14.6 % (ref 11.5–15.5)
WBC: 10 10*3/uL (ref 4.0–10.5)
nRBC: 0 % (ref 0.0–0.2)

## 2021-10-19 LAB — SURGICAL PATHOLOGY

## 2021-10-19 LAB — HEPARIN LEVEL (UNFRACTIONATED): Heparin Unfractionated: 0.58 IU/mL (ref 0.30–0.70)

## 2021-10-19 MED ORDER — APIXABAN 2.5 MG PO TABS: 2.5000 mg | ORAL_TABLET | Freq: Two times a day (BID) | ORAL | Status: DC

## 2021-10-19 MED ORDER — APIXABAN 5 MG PO TABS
5.0000 mg | ORAL_TABLET | Freq: Two times a day (BID) | ORAL | Status: DC
  Administered 2021-10-19 – 2021-10-20 (×3): 5 mg via ORAL
  Filled 2021-10-19 (×3): qty 1

## 2021-10-19 MED ORDER — ASPIRIN 81 MG PO CHEW
81.0000 mg | CHEWABLE_TABLET | Freq: Every day | ORAL | Status: DC
  Administered 2021-10-19 – 2021-10-20 (×2): 81 mg via ORAL
  Filled 2021-10-19 (×2): qty 1

## 2021-10-19 NOTE — Progress Notes (Signed)
Physical Therapy Treatment Patient Details Name: Sylvia [name] MRN: [id number] DOB: 08/03/1955 Today's Date: 10/19/2021   History of Present Illness Pt is a 66 y/o F admitted on 10/13/21 for LLE angiogram & angioplasty. Pt also underwent L femoral endarterectomy on 10/16/21. PMH: DM, PVD, chronic pain related to both & prior RLE BKA    PT Comments    Pt in chair since OT eval this am and ready to get back to bed.  Lateral scoot left from drop arm recliner.  No physical assist given besides blocking L foot.  Returned to supine without assist.    Pt remains self limiting to transfers only at this time.  Stated she has all equipment needed and is refusing SNF in order to return home.  Stated she has assist.  While SNF remains appropriate for better outcomes and faster return to baseline she does seem to be well set for home if she chooses.   Recommendations for follow up therapy are one component of a multi-disciplinary discharge planning process, led by the attending physician.  Recommendations may be updated based on patient status, additional functional criteria and insurance authorization.  Follow Up Recommendations  Skilled nursing-short term rehab (<3 hours/day) Can patient physically be transported by private vehicle: No   Assistance Recommended at Discharge Frequent or constant Supervision/Assistance  Patient can return home with the following Assistance with cooking/housework;Assist for transportation;Help with stairs or ramp for entrance;A little help with walking and/or transfers;A little help with bathing/dressing/bathroom   Equipment Recommendations  None recommended by PT    Recommendations for Other Services       Precautions / Restrictions Precautions Precautions: Fall Precaution Comments: R BKA with prosthesis Restrictions Weight Bearing Restrictions: No RLE Weight Bearing: Weight bearing as tolerated LLE Weight Bearing: Weight bearing as tolerated     Mobility   Bed Mobility Overal bed mobility: Needs Assistance Bed Mobility: Sit to Supine       Sit to supine: Modified independent (Device/Increase time)        Transfers Overall transfer level: Needs assistance Equipment used: None Transfers: Bed to chair/wheelchair/BSC Sit to Stand: Supervision          Lateral/Scoot Transfers: Min guard, Supervision General transfer comment: foot is blocked to prevent sliding in sock but no physical assist needed.    Ambulation/Gait                   Stairs             Wheelchair Mobility    Modified Rankin (Stroke Patients Only)       Balance Overall balance assessment: Needs assistance Sitting-balance support: Feet supported, Bilateral upper extremity supported, Single extremity supported, No upper extremity supported, Feet unsupported Sitting balance-Leahy Scale: Fair                                      Cognition Arousal/Alertness: Awake/alert Behavior During Therapy: WFL for tasks assessed/performed Overall Cognitive Status: Within Functional Limits for tasks assessed                                          Exercises      General Comments        Pertinent Vitals/Pain Pain Assessment Pain Assessment: Faces Faces Pain Scale: Hurts little more Pain Location: L  groin Pain Descriptors / Indicators: Discomfort, Aching, Grimacing Pain Intervention(s): Limited activity within patient's tolerance, Monitored during session, Repositioned    Home Living                          Prior Function            PT Goals (current goals can now be found in the care plan section) Progress towards PT goals: Progressing toward goals    Frequency    Min 2X/week      PT Plan      Co-evaluation              AM-PAC PT "6 Clicks" Mobility   Outcome Measure  Help needed turning from your back to your side while in a flat bed without using bedrails?: None Help  needed moving from lying on your back to sitting on the side of a flat bed without using bedrails?: None Help needed moving to and from a bed to a chair (including a wheelchair)?: A Little Help needed standing up from a chair using your arms (e.g., wheelchair or bedside chair)?: A Lot Help needed to walk in hospital room?: A Lot Help needed climbing 3-5 steps with a railing? : Total 6 Click Score: 16    End of Session   Activity Tolerance: Patient tolerated treatment well (limited by not feeling wel) Patient left: in chair;with call bell/phone within reach Nurse Communication: Mobility status (notified nurse of BP) PT Visit Diagnosis: Muscle weakness (generalized) (M62.81);Difficulty in walking, not elsewhere classified (R26.2);Other abnormalities of gait and mobility (R26.89);Unsteadiness on feet (R26.81)     Time: 9528-4132 PT Time Calculation (min) (ACUTE ONLY): 10 min  Charges:  $Therapeutic Activity: 8-22 mins                   Danielle Dess, PTA 10/19/21, 4:13 PM

## 2021-10-19 NOTE — Progress Notes (Signed)
    Subjective  - POD #3  Feeling better this morning   Physical Exam:  VAC remains with good seal Left foot warm and well-perfused       Assessment/Plan:  POD #3  Acute blood loss anemia: Hemoglobin remained stable and is trending upward Patient would like to work 1 more day with physical therapy. Anticipate discharge home possibly tomorrow We will transition to Eliquis, off of heparin today  Wells Sylvia [name] 10/19/2021 8:15 AM --  Vitals:   10/18/21 2153 10/19/21 0541  BP: (!) 106/59 119/61  Pulse: (!) 105 99  Resp: 16 16  Temp: 98.6 F (37 C) 100.2 F (37.9 C)  SpO2: 94% 95%    Intake/Output Summary (Last 24 hours) at 10/19/2021 0815 Last data filed at 10/19/2021 0331 Gross per 24 hour  Intake 1471.12 ml  Output 1150 ml  Net 321.12 ml     Laboratory CBC    Component Value Date/Time   WBC 10.0 10/19/2021 0051   HGB 8.1 (L) 10/19/2021 0051   HGB 8.1 (L) 05/09/2019 1539   HCT 24.9 (L) 10/19/2021 0051   HCT 24.6 (L) 05/09/2019 1539   PLT 267 10/19/2021 0051   PLT 470 (H) 05/09/2019 1539    BMET    Component Value Date/Time   NA 135 10/19/2021 0051   K 4.1 10/19/2021 0051   CL 102 10/19/2021 0051   CO2 24 10/19/2021 0051   GLUCOSE 218 (H) 10/19/2021 0051   BUN 20 10/19/2021 0051   CREATININE 0.76 10/19/2021 0051   CALCIUM 9.0 10/19/2021 0051   GFRNONAA >60 10/19/2021 0051   GFRAA >60 11/23/2019 1505    COAG Lab Results  Component Value Date   INR 1.3 (H) 09/21/2021   INR 1.1 12/25/2020   INR 1.0 07/25/2019   No results found for: "PTT"  Antibiotics Anti-infectives (From admission, onward)    Start     Dose/Rate Route Frequency Ordered Stop   10/17/21 0600  ciprofloxacin (CIPRO) IVPB 400 mg  Status:  Discontinued        400 mg 200 mL/hr over 60 Minutes Intravenous On call to O.R. 10/16/21 1122 10/17/21 0531   10/14/21 1040  ciprofloxacin (CIPRO) IVPB  Status:  Discontinued        over 60 Minutes  Continuous PRN 10/14/21 1041 10/14/21  1145   10/14/21 0919  ciprofloxacin (CIPRO) IVPB 400 mg  Status:  Discontinued        400 mg 200 mL/hr over 60 Minutes Intravenous 60 min pre-op 10/14/21 0919 10/14/21 1145   10/13/21 0020  ciprofloxacin (CIPRO) IVPB 400 mg        400 mg 200 mL/hr over 60 Minutes Intravenous 60 min pre-op 10/13/21 0020 10/13/21 1430        V. Charlena Cross, M.D., [facility] [place] Office: [phone] Pager:  [phone]

## 2021-10-19 NOTE — Progress Notes (Signed)
ANTICOAGULATION CONSULT NOTE  Pharmacy Consult for heparin monitoring Indication: VTE prophylaxis  Allergies  Allergen Reactions   Vancomycin Rash    Patient developed a rash to injection site and arm a few mintes after starting ABX.    Hydrocodone Rash   Metformin And Related Diarrhea   Penicillins Hives    Has patient had a PCN reaction causing immediate rash, facial/tongue/throat swelling, SOB or lightheadedness with hypotension: Yes Has patient had a PCN reaction causing severe rash involving mucus membranes or skin necrosis: No Has patient had a PCN reaction that required hospitalization: No Has patient had a PCN reaction occurring within the last 10 years: No If all of the above answers are "NO", then may proceed with Cephalosporin use.   Tramadol Itching    Patient Measurements: Height: 5\' 7"  (170.2 cm) Weight: 63.5 kg (139 lb 15.9 oz) IBW/kg (Calculated) : 61.6 Heparin Dosing Weight: 63.5 kg  Vital Signs: Temp: 100.2 F (37.9 C) (07/03 0541) BP: 119/61 (07/03 0541) Pulse Rate: 99 (07/03 0541)  Labs: Recent Labs    10/17/21 0351 10/17/21 1558 10/17/21 2024 10/18/21 0319 10/18/21 1216 10/18/21 1853 10/19/21 0051  HGB 7.3* 8.2*  --  8.1*  --   --  8.1*  HCT 22.3* 25.3*  --  24.5*  --   --  24.9*  PLT 213 241  --  241  --   --  267  HEPARINUNFRC  --   --    < > 0.70 0.82* 0.62 0.58  CREATININE 0.84  --   --  0.85  --   --  0.76   < > = values in this interval not displayed.     Estimated Creatinine Clearance: 67.3 mL/min (by C-G formula based on SCr of 0.76 mg/dL).   Medical History: Past Medical History:  Diagnosis Date   Anemia of chronic disease 05/16/2019   Anxiety    h/o   COPD (chronic obstructive pulmonary disease) (HCC)    Diabetes mellitus without complication (HCC)    GERD (gastroesophageal reflux disease)    Hypertension    bp under control-off meds since 2019    Medications:  Scheduled:   gabapentin  600 mg Oral Q breakfast   And    gabapentin  900 mg Oral QHS   insulin aspart  0-15 Units Subcutaneous TID WC   insulin aspart  0-5 Units Subcutaneous QHS   insulin glargine-yfgn  10 Units Subcutaneous QHS   lisinopril  10 mg Oral Daily   multivitamin with minerals  1 tablet Oral Daily   polyethylene glycol  17 g Oral Daily   Ensure Max Protein  11 oz Oral BID    Assessment: 66 year old female with hx of DM, PVD, chronic pain related to both and prior RLE BKA.  She presented to [facility] ER on 06/27 for left lower extremity angiogram with angioplasty, now s/p left femoral endarterectomy with patch.  Goal of Therapy:  Heparin level 0.3-0.7 units/ml Monitor platelets by anticoagulation protocol: Yes    Plan: Transitioning to apixaban 2.5 mg PO BID. Discontinue heparin at time of apixaban administration  7/27, PharmD 10/19/2021 8:29 AM

## 2021-10-19 NOTE — Progress Notes (Signed)
Nutrition Follow-up  DOCUMENTATION CODES:   Not applicable  INTERVENTION:   -Continue MVI with minerals daily -Continue Ensure Max po BID, each supplement provides 150 kcal and 30 grams of protein.    NUTRITION DIAGNOSIS:   Increased nutrient needs related to post-op healing as evidenced by estimated needs.  Ongoing  GOAL:   Patient will meet greater than or equal to 90% of their needs  Progressing   MONITOR:   PO intake, Supplement acceptance, Diet advancement  REASON FOR ASSESSMENT:   Malnutrition Screening Tool    ASSESSMENT:   Pt hx of DM, PVD, chronic pain related to both and prior RLE BKA.  She presents for left lower extremity angiogram with angioplasty and TPA to infuse overnight in the ICU with return to OR on 06/28  Reviewed I/O's: +321 ml x 24 hours and -6 L since admission  UOP: 1.2 L x 24 hours  Spoke with pt, who was sitting in recliner chair at time of visit. She reports good appetite, consumed about 75% of her lunch. Per pt, she had a good appetite PTA, but "I eat whatever I want". Per pt, her CBGS usually run below 200 and is concerned about hyperglycemia here in the hospital. RD discussed role of stress response and acute illness on blood suagrs and how DM is managed differently in the hospital vs outpatient.   Per pt, she plans to d/c home tomorrow.   Medications reviewed and include miralax.   Labs reviewed: CBGS: 170-214 (inpatient orders for glycemic control are 0-15 units insulin aspart TID with meals, 0-5 units insulin aspart daily at bedtime, 10 units insulin glargine-yfgn daily at bedtime).    NUTRITION - FOCUSED PHYSICAL EXAM:  Flowsheet Row Most Recent Value  Orbital Region No depletion  Upper Arm Region No depletion  Thoracic and Lumbar Region No depletion  Buccal Region No depletion  Temple Region No depletion  Clavicle Bone Region No depletion  Clavicle and Acromion Bone Region No depletion  Scapular Bone Region No depletion   Dorsal Hand No depletion  Patellar Region Mild depletion  Anterior Thigh Region Mild depletion  Posterior Calf Region Mild depletion  Edema (RD Assessment) None  Hair Reviewed  Eyes Reviewed  Mouth Reviewed  Skin Reviewed  Nails Reviewed       Diet Order:   Diet Order             Diet heart healthy/carb modified Room service appropriate? Yes; Fluid consistency: Thin  Diet effective now                   EDUCATION NEEDS:   No education needs have been identified at this time  Skin:  Skin Assessment: Reviewed RN Assessment  Last BM:  10/12/21  Height:   Ht Readings from Last 1 Encounters:  10/14/21 5\' 7"  (1.702 m)    Weight:   Wt Readings from Last 1 Encounters:  10/14/21 63.5 kg    Ideal Body Weight:  57.4 kg (adjusted for rt BKA)  BMI:  Body mass index is 21.93 kg/m.  Estimated Nutritional Needs:   Kcal:  1700-1900  Protein:  90-105 grams  Fluid:  > 1.7 L    10/16/21, RD, LDN, CDCES Registered Dietitian II Certified Diabetes Care and Education Specialist Please refer to [facility] for RD and/or RD on-call/weekend/after hours pager

## 2021-10-19 NOTE — TOC Initial Note (Signed)
Transition of Care [facility]) - Initial/Assessment Note    Patient Details  Name: Sylvia [name] MRN: [id number] Date of Birth: 02-Feb-1956  Transition of Care [facility]) CM/SW Contact:    Maree Krabbe, LCSW Phone Number: 10/19/2021, 3:36 PM  Clinical Narrative:     CSW spoke with pt regarding dc plan and pt is refusing SNF stating she is dc home when she is medically ready. Pt states he is also ok with no home health if CSW cannot secure. Pt would be agreeable to outpatient PT and states the hospital location would be fine. Pt states she has a walker and bsc at home and denies any other DME needs.               Expected Discharge Plan: Home/Self Care Barriers to Discharge: Continued Medical Work up   Patient Goals and CMS Choice Patient states their goals for this hospitalization and ongoing recovery are:: to get better   Choice offered to / list presented to : Patient  Expected Discharge Plan and Services Expected Discharge Plan: Home/Self Care In-house Referral: NA   Post Acute Care Choice: Home Health Living arrangements for the past 2 months: Single Family Home                                      Prior Living Arrangements/Services Living arrangements for the past 2 months: Single Family Home Lives with:: Spouse Patient language and need for interpreter reviewed:: Yes Do you feel safe going back to the place where you live?: Yes      Need for Family Participation in Patient Care: Yes (Comment) Care giver support system in place?: Yes (comment)   Criminal Activity/Legal Involvement Pertinent to Current Situation/Hospitalization: No - Comment as needed  Activities of Daily Living Home Assistive Devices/Equipment: Walker (specify type), Cane (specify quad or straight) ADL Screening (condition at time of admission) Patient's cognitive ability adequate to safely complete daily activities?: Yes Is the patient deaf or have difficulty hearing?: No Does the patient have  difficulty seeing, even when wearing glasses/contacts?: No Does the patient have difficulty concentrating, remembering, or making decisions?: No Patient able to express need for assistance with ADLs?: Yes Does the patient have difficulty dressing or bathing?: No Independently performs ADLs?: Yes (appropriate for developmental age) Does the patient have difficulty walking or climbing stairs?: No Weakness of Legs: None Weakness of Arms/Hands: None  Permission Sought/Granted Permission sought to share information with : Family Supports Permission granted to share information with : Yes, Release of Information Signed  Share Information with NAME: david     Permission granted to share info w Relationship: spouse     Emotional Assessment Appearance:: Appears stated age Attitude/Demeanor/Rapport: Engaged Affect (typically observed): Accepting Orientation: : Oriented to Self, Oriented to Place, Oriented to  Time, Oriented to Situation Alcohol / Substance Use: Not Applicable Psych Involvement: No (comment)  Admission diagnosis:  Atherosclerosis of artery of extremity with rest pain [facility]) [I70.229] Patient Active Problem List   Diagnosis Date Noted   Multiple duodenal ulcers    Ischemic leg 12/25/2020   Rheumatoid arthritis of 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) 08/13/2020   Diabetes mellitus without complication (HCC)    GERD (gastroesophageal reflux disease)    COPD (chronic obstructive pulmonary disease) (HCC)    GIB (gastrointestinal bleeding)    HLD (hyperlipidemia)    Depression    Elevated troponin  Syncope    Elevated troponin I level    Anemia of chronic disease 05/16/2019   Dizziness    Polyp of colon    Acute upper GI bleeding 04/13/2019   Anemia associated with acute blood loss 04/13/2019   Chronic anticoagulation 04/13/2019   Hx of right BKA (HCC) 04/13/2019   Symptomatic anemia 04/13/2019   Upper GI bleed 04/13/2019   Acute GI bleeding  04/13/2019   Phantom pain after amputation of lower extremity (HCC) 01/30/2019   Melena    DU (duodenal ulcer)    DKA (diabetic ketoacidoses) 12/14/2018   Atherosclerosis of artery of extremity with rest pain (HCC) 08/28/2018   PAD (peripheral artery disease) (HCC) 07/14/2018   Abdominal pain 06/13/2018   HTN (hypertension) 06/13/2018   Non-insulin dependent type 2 diabetes mellitus (HCC) 06/13/2018   Elevated lactic acid level 06/13/2018   PCP:  [facility] Pharmacy:   [facility] - [place], [place] - [address] [place] [address] Phone: [phone] Fax: [phone]  [facility] [address] [place] [address] Phone: [phone] Fax: [phone]  [facility] [address] ([address]), [place] - [address] [place] ([place]) [place] [address] Phone: [phone] Fax: [phone]  [facility] ([facility] ) - [place], [place] - [address] [place] [address] Phone: [phone] Fax: [phone]     Social Determinants of Health (SDOH) Interventions    Readmission Risk Interventions     No data to display

## 2021-10-19 NOTE — Evaluation (Signed)
Occupational Therapy Evaluation Patient Details Name: Sylvia [name] MRN: [id number] DOB: March 22, 1956 Today's Date: 10/19/2021   History of Present Illness Pt is a 66 y/o F admitted on 10/13/21 for LLE angiogram & angioplasty. Pt also underwent L femoral endarterectomy on 10/16/21. PMH: DM, PVD, chronic pain related to both & prior RLE BKA   Clinical Impression   Pt was seen for OT evaluation this date. Prior to hospital admission, pt was ambulating with RW and prosthetic. Pt lives with her spouse. Currently pt demonstrates impairments in L groin pain, balance, strength, and activity tolerance as described below (See OT problem list) which functionally limit her ability to perform ADL/self-care tasks.  Pt donned sock seated EOB with incr time/effort due to L groin pain with hip flexion. Able to doff briefs and don clean pair seated EOB using alternating lateral leans requiring SBA for safety. Completed pericare using lateral leans as well. Pt completed lateral scoot transfer to recliner with SBA-CGA. Pt would benefit from skilled OT services to address noted impairments and functional limitations (see below for any additional details) in order to maximize safety and independence while minimizing falls risk and caregiver burden. Upon hospital discharge, recommend STR to maximize pt safety and return to PLOF.      Recommendations for follow up therapy are one component of a multi-disciplinary discharge planning process, led by the attending physician.  Recommendations may be updated based on patient status, additional functional criteria and insurance authorization.   Follow Up Recommendations  Skilled nursing-short term rehab (<3 hours/day)    Assistance Recommended at Discharge Intermittent Supervision/Assistance  Patient can return home with the following A little help with walking and/or transfers;A little help with bathing/dressing/bathroom;Assist for transportation;Assistance with  cooking/housework;Help with stairs or ramp for entrance    Functional Status Assessment  Patient has had a recent decline in their functional status and demonstrates the ability to make significant improvements in function in a reasonable and predictable amount of time.  Equipment Recommendations  BSC/3in1    Recommendations for Other Services       Precautions / Restrictions Precautions Precautions: Fall Precaution Comments: R BKA with prosthesis Restrictions Weight Bearing Restrictions: No      Mobility Bed Mobility Overal bed mobility: Needs Assistance Bed Mobility: Supine to Sit     Supine to sit: Supervision     General bed mobility comments: incr time/effort, pain at wound vac site    Transfers Overall transfer level: Needs assistance Equipment used: None Transfers: Bed to chair/wheelchair/BSC            Lateral/Scoot Transfers: Min guard, Supervision General transfer comment: SBA for lateral scoot from EOB to recliner      Balance Overall balance assessment: Needs assistance Sitting-balance support: Feet supported, Bilateral upper extremity supported, Single extremity supported, No upper extremity supported, Feet unsupported Sitting balance-Leahy Scale: Fair                                     ADL either performed or assessed with clinical judgement   ADL                                         General ADL Comments: Pt donned sock seated EOB with incr time/effort due to L groin pain with hip flexion. Able to doff briefs and don  clean pair seated EOB using alternating lateral leans. Completed pericare using lateral leans as well. SBA for lateral scoot transfer to recliner.     Vision         Perception     Praxis      Pertinent Vitals/Pain Pain Assessment Pain Assessment: 0-10 Pain Score: 7  Pain Location: L groin Pain Descriptors / Indicators: Discomfort, Aching, Grimacing Pain Intervention(s): Limited  activity within patient's tolerance, Monitored during session, Premedicated before session, Repositioned     Hand Dominance     Extremity/Trunk Assessment Upper Extremity Assessment Upper Extremity Assessment: Generalized weakness   Lower Extremity Assessment Lower Extremity Assessment: RLE deficits/detail;LLE deficits/detail RLE Deficits / Details: hx of R BKA LLE Deficits / Details: wound vac intact to L groin       Communication Communication Communication: No difficulties   Cognition Arousal/Alertness: Awake/alert Behavior During Therapy: WFL for tasks assessed/performed Overall Cognitive Status: Within Functional Limits for tasks assessed                                       General Comments       Exercises     Shoulder Instructions      Home Living   Living Arrangements: Spouse/significant other Available Help at Discharge: Family;Friend(s);Available 24 hours/day Type of Home: House Home Access: Ramped entrance     Home Layout: One level               Home Equipment: Cane - single point;Hand held shower head;Wheelchair - Careers adviser (comment);Rolling Walker (2 wheels)          Prior Functioning/Environment Prior Level of Function : Independent/Modified Independent             Mobility Comments: Pt has been ambulating with prosthesis & RW since last admission.          OT Problem List: Decreased strength;Impaired balance (sitting and/or standing);Decreased knowledge of use of DME or AE;Pain      OT Treatment/Interventions: Self-care/ADL training;Therapeutic exercise;Therapeutic activities;DME and/or AE instruction;Patient/family education;Balance training    OT Goals(Current goals can be found in the care plan section) Acute Rehab OT Goals Patient Stated Goal: get better and go home OT Goal Formulation: With patient Time For Goal Achievement: 11/02/21 Potential to Achieve Goals: Good ADL Goals Pt Will Perform Lower  Body Dressing: with modified independence;sitting/lateral leans Pt Will Transfer to Toilet: with modified independence;squat pivot transfer Pt Will Perform Toileting - Clothing Manipulation and hygiene: with modified independence;sitting/lateral leans Additional ADL Goal #1: Pt will complete all aspects of bathing with mod indep, from seated position.  OT Frequency: Min 2X/week    Co-evaluation              AM-PAC OT "6 Clicks" Daily Activity     Outcome Measure Help from another person eating meals?: None Help from another person taking care of personal grooming?: None Help from another person toileting, which includes using toliet, bedpan, or urinal?: A Little Help from another person bathing (including washing, rinsing, drying)?: A Little Help from another person to put on and taking off regular upper body clothing?: None Help from another person to put on and taking off regular lower body clothing?: A Little 6 Click Score: 21   End of Session    Activity Tolerance: Patient tolerated treatment well Patient left: in chair;with call bell/phone within reach;Other (comment) (LLE elevated on pillows per RN's  request due to swelling)  OT Visit Diagnosis: Other abnormalities of gait and mobility (R26.89);Pain Pain - Right/Left: Left Pain - part of body: Leg                Time: [id number] OT Time Calculation (min): 22 min Charges:  OT General Charges $OT Visit: 1 Visit OT Evaluation $OT Eval Moderate Complexity: 1 Mod OT Treatments $Self Care/Home Management : 8-22 mins  Ardeth Perfect., MPH, MS, OTR/L ascom [phone] 10/19/21, 10:47 AM

## 2021-10-19 NOTE — Progress Notes (Signed)
ANTICOAGULATION CONSULT NOTE  Pharmacy Consult for heparin monitoring Indication: VTE prophylaxis  Allergies  Allergen Reactions   Vancomycin Rash    Patient developed a rash to injection site and arm a few mintes after starting ABX.    Hydrocodone Rash   Metformin And Related Diarrhea   Penicillins Hives    Has patient had a PCN reaction causing immediate rash, facial/tongue/throat swelling, SOB or lightheadedness with hypotension: Yes Has patient had a PCN reaction causing severe rash involving mucus membranes or skin necrosis: No Has patient had a PCN reaction that required hospitalization: No Has patient had a PCN reaction occurring within the last 10 years: No If all of the above answers are "NO", then may proceed with Cephalosporin use.   Tramadol Itching    Patient Measurements: Height: 5\' 7"  (170.2 cm) Weight: 63.5 kg (139 lb 15.9 oz) IBW/kg (Calculated) : 61.6 Heparin Dosing Weight: 63.5 kg  Vital Signs: Temp: 98.6 F (37 C) (07/02 2153) BP: 106/59 (07/02 2153) Pulse Rate: 105 (07/02 2153)  Labs: Recent Labs    10/17/21 0351 10/17/21 1558 10/17/21 2024 10/18/21 0319 10/18/21 1216 10/18/21 1853 10/19/21 0051  HGB 7.3* 8.2*  --  8.1*  --   --  8.1*  HCT 22.3* 25.3*  --  24.5*  --   --  24.9*  PLT 213 241  --  241  --   --  267  HEPARINUNFRC  --   --    < > 0.70 0.82* 0.62 0.58  CREATININE 0.84  --   --  0.85  --   --  0.76   < > = values in this interval not displayed.     Estimated Creatinine Clearance: 67.3 mL/min (by C-G formula based on SCr of 0.76 mg/dL).   Medical History: Past Medical History:  Diagnosis Date   Anemia of chronic disease 05/16/2019   Anxiety    h/o   COPD (chronic obstructive pulmonary disease) (HCC)    Diabetes mellitus without complication (HCC)    GERD (gastroesophageal reflux disease)    Hypertension    bp under control-off meds since 2019    Medications:  Scheduled:   Chlorhexidine Gluconate Cloth  6 each Topical  Q0600   gabapentin  600 mg Oral Q breakfast   And   gabapentin  900 mg Oral QHS   insulin aspart  0-15 Units Subcutaneous TID WC   insulin aspart  0-5 Units Subcutaneous QHS   insulin glargine-yfgn  10 Units Subcutaneous QHS   lisinopril  10 mg Oral Daily   multivitamin with minerals  1 tablet Oral Daily   polyethylene glycol  17 g Oral Daily   Ensure Max Protein  11 oz Oral BID    Assessment: 66 year old female with hx of DM, PVD, chronic pain related to both and prior RLE BKA.  She presented to [facility] ER on 06/27 for left lower extremity angiogram with angioplasty and TPA to infuse overnight in the ICU with return to OR on 06/28 now POD #1, s/p left femoral endarterectomy with patch  Goal of Therapy:  Heparin level 0.3-0.7 units/ml Monitor platelets by anticoagulation protocol: Yes   Date/Time aPTT/HL Comments 7/1@2024  HL 0.53 Therap x 1 @ 1250 un/hr 7/2 0319 HL 0.70 Therapeutic x 2 7/2@1225  HL 0.82 Suprathera, decrease rate 7/2@1853  HL 0.62 Therapeutic x 1 ,1150un/hr 7/3@0051  HL 0.58 Therapeutic x 2  Plan:  Continue heparin infusion at 1150 units/hr Recheck HL daily w/ AM labs while therapeutic Continue to  monitor H&H and platelets  Otelia Sergeant, PharmD, [facility] 10/19/2021 2:15 AM

## 2021-10-19 NOTE — Care Management Important Message (Signed)
Important Message  Patient Details  Name: Sylvia [name] MRN: [id number] Date of Birth: [birth year]   Medicare Important Message Given:  Yes     Olegario Messier A Musette Kisamore 10/19/2021, 12:12 PM

## 2021-10-20 ENCOUNTER — Other Ambulatory Visit: Payer: Self-pay | Admitting: Surgery

## 2021-10-20 LAB — CBC
HCT: 24.8 % — ABNORMAL LOW (ref 36.0–46.0)
Hemoglobin: 8.2 g/dL — ABNORMAL LOW (ref 12.0–15.0)
MCH: 28.7 pg (ref 26.0–34.0)
MCHC: 33.1 g/dL (ref 30.0–36.0)
MCV: 86.7 fL (ref 80.0–100.0)
Platelets: 298 10*3/uL (ref 150–400)
RBC: 2.86 MIL/uL — ABNORMAL LOW (ref 3.87–5.11)
RDW: 14.4 % (ref 11.5–15.5)
WBC: 9.5 10*3/uL (ref 4.0–10.5)
nRBC: 0 % (ref 0.0–0.2)

## 2021-10-20 LAB — RENAL FUNCTION PANEL
Albumin: 2.9 g/dL — ABNORMAL LOW (ref 3.5–5.0)
Anion gap: 6 (ref 5–15)
BUN: 25 mg/dL — ABNORMAL HIGH (ref 8–23)
CO2: 24 mmol/L (ref 22–32)
Calcium: 9.3 mg/dL (ref 8.9–10.3)
Chloride: 108 mmol/L (ref 98–111)
Creatinine, Ser: 0.84 mg/dL (ref 0.44–1.00)
GFR, Estimated: >60 mL/min (ref >60)
Glucose, Bld: 218 mg/dL — ABNORMAL HIGH (ref 70–99)
Phosphorus: 3.8 mg/dL (ref 2.5–4.6)
Potassium: 4.3 mmol/L (ref 3.5–5.1)
Sodium: 138 mmol/L (ref 135–145)

## 2021-10-20 LAB — HEPARIN LEVEL (UNFRACTIONATED): Heparin Unfractionated: >1.1 IU/mL — ABNORMAL HIGH (ref 0.30–0.70)

## 2021-10-20 LAB — GLUCOSE, CAPILLARY
Glucose-Capillary: 205 mg/dL — ABNORMAL HIGH (ref 70–99)
Glucose-Capillary: 274 mg/dL — ABNORMAL HIGH (ref 70–99)

## 2021-10-20 MED ORDER — OXYCODONE HCL 5 MG PO TABS: 5.0000 mg | ORAL_TABLET | Freq: Three times a day (TID) | ORAL | 0 refills | Status: DC | PRN

## 2021-10-20 MED ORDER — APIXABAN 5 MG PO TABS: 5.0000 mg | ORAL_TABLET | Freq: Two times a day (BID) | ORAL | 12 refills | Status: AC

## 2021-10-20 NOTE — Progress Notes (Signed)
Physical Therapy Treatment Patient Details Name: Sylvia [name] MRN: [id number] DOB: 1955-07-26 Today's Date: 10/20/2021   History of Present Illness Pt is a 66 y/o F admitted on 10/13/21 for LLE angiogram & angioplasty. Pt also underwent L femoral endarterectomy on 10/16/21. PMH: DM, PVD, chronic pain related to both & prior RLE BKA    PT Comments    Patient seen for PT session per request of care team, as patient requesting discharge home (and medical team wants updated status/clearance).   Patient able to indep don R LE socket and prosthesis for gait attempts.  Complete sit/stand and gait efforts (30) with RW, min assist.  Intermittent blocking of L LE with transitions to prevent sliding, but able to complete all mobility efforts with on greater than min assist from therapist. Appears to be at/near baseline for functional mobility; has all equipment needed and great family support (per patient) to facilitate safe and successful discharge home. Would benefit from HHPT follow up (outpatient okay if HH not available); did encourage patient to discuss potential for L LE AFO with PCP due to noted L foot drop (result of previous surgery per patient). Care team updated/aware of updated discharge recommendations.    Recommendations for follow up therapy are one component of a multi-disciplinary discharge planning process, led by the attending physician.  Recommendations may be updated based on patient status, additional functional criteria and insurance authorization.  Follow Up Recommendations  Home health PT     Assistance Recommended at Discharge    Patient can return home with the following Assistance with cooking/housework;Assist for transportation;Help with stairs or ramp for entrance;A little help with walking and/or transfers;A little help with bathing/dressing/bathroom   Equipment Recommendations       Recommendations for Other Services       Precautions / Restrictions  Precautions Precautions: Fall Precaution Comments: R BKA with prosthesis Restrictions Weight Bearing Restrictions: No RLE Weight Bearing: Weight bearing as tolerated LLE Weight Bearing: Weight bearing as tolerated     Mobility  Bed Mobility Overal bed mobility: Modified Independent                  Transfers Overall transfer level: Needs assistance Equipment used: Rolling walker (2 wheels) Transfers: Sit to/from Stand, Bed to chair/wheelchair/BSC Sit to Stand: Min assist     Squat pivot transfers: Supervision     General transfer comment: assist for position of L foot and blocking to prevent sliding with both lateral scoot and sit/satnd transfers    Ambulation/Gait Ambulation/Gait assistance: Min guard, Min assist Gait Distance (Feet): 30 Feet Assistive device: Rolling walker (2 wheels)         General Gait Details: very slow, deliberate stepping pattern; heavy WBing bilat UEs.  Steppage gait L LE with noted foot drop (per patient, result of previous surgery). No overt buckling or LOB   Stairs             Wheelchair Mobility    Modified Rankin (Stroke Patients Only)       Balance Overall balance assessment: Needs assistance Sitting-balance support: No upper extremity supported, Feet supported Sitting balance-Leahy Scale: Good     Standing balance support: Bilateral upper extremity supported Standing balance-Leahy Scale: Fair                              Cognition Arousal/Alertness: Awake/alert Behavior During Therapy: WFL for tasks assessed/performed Overall Cognitive Status: Within Functional Limits for tasks assessed  Exercises      General Comments        Pertinent Vitals/Pain Pain Assessment Pain Assessment: Faces Faces Pain Scale: Hurts little more Pain Location: L groin Pain Descriptors / Indicators: Discomfort, Aching, Grimacing Pain Intervention(s):  Limited activity within patient's tolerance, Monitored during session, Repositioned, Patient requesting pain meds-RN notified    Home Living                          Prior Function            PT Goals (current goals can now be found in the care plan section) Acute Rehab PT Goals Patient Stated Goal: feel better PT Goal Formulation: With patient Time For Goal Achievement: 11/01/21 Potential to Achieve Goals: Good Progress towards PT goals: Progressing toward goals    Frequency    Min 2X/week      PT Plan Discharge plan needs to be updated    Co-evaluation              AM-PAC PT "6 Clicks" Mobility   Outcome Measure  Help needed turning from your back to your side while in a flat bed without using bedrails?: None Help needed moving from lying on your back to sitting on the side of a flat bed without using bedrails?: None Help needed moving to and from a bed to a chair (including a wheelchair)?: A Little Help needed standing up from a chair using your arms (e.g., wheelchair or bedside chair)?: A Little Help needed to walk in hospital room?: A Little Help needed climbing 3-5 steps with a railing? : A Lot 6 Click Score: 19    End of Session Equipment Utilized During Treatment: Gait belt Activity Tolerance: Patient tolerated treatment well Patient left: in chair;with call bell/phone within reach Nurse Communication: Mobility status PT Visit Diagnosis: Muscle weakness (generalized) (M62.81);Difficulty in walking, not elsewhere classified (R26.2);Other abnormalities of gait and mobility (R26.89);Unsteadiness on feet (R26.81) Pain - Right/Left: Left Pain - part of body: Leg     Time: 4431-5400 PT Time Calculation (min) (ACUTE ONLY): 24 min  Charges:  $Gait Training: 8-22 mins $Therapeutic Activity: 8-22 mins                    Angeli Demilio H. Manson Passey, PT, DPT, NCS 10/20/21, 10:15 AM [phone]

## 2021-10-20 NOTE — Clinical Social Work Note (Addendum)
Occupational Therapy * Physical Therapy * Speech Therapy          DATE ______7/4/2023_____________ PATIENT NAME____[name]_________________ PATIENT MRN__________[id number]__________  DIAGNOSIS/DIAGNOSIS CODE ______________________ DATE OF DISCHARGE: ______7/4/2023________  PRIMARY CARE PHYSICIAN __________________________ PCP PHONE/FAX___________________________     Dear Provider (Name: __________________   Fax: ___________________________):   I certify that I have examined this patient and that occupational/physical/speech therapy is necessary on an outpatient basis.    The patient has expressed interest in completing their recommended course of therapy at your location.  Once a formal order from the patient's primary care physician has been obtained, please contact him/her to schedule an appointment for evaluation at your earliest convenience.   [ X ]  Physical Therapy Evaluate and Treat Arly.Keller  ]  Occupational Therapy Evaluate and Treat      [  ]  Speech Therapy Evaluate and Treat       The patient's primary care physician (listed above) must furnish and be responsible for a formal order such that the recommended services may be furnished while under the primary physician's care, and that the plan of care will be established and reviewed every 30 days (or more often if condition necessitates).

## 2021-10-20 NOTE — TOC Transition Note (Signed)
Transition of Care [facility]) - CM/SW Discharge Note   Patient Details  Name: Sylvia [name] MRN: [id number] Date of Birth: 1955/08/21  Transition of Care [facility]) CM/SW Contact:  Maree Krabbe, LCSW Phone Number: 10/20/2021, 12:16 PM   Clinical Narrative:  CSW unable to secure [facility] due to insurance. Outpatient referral faxed to [facility] location.      Barriers to Discharge: Continued Medical Work up   Patient Goals and CMS Choice Patient states their goals for this hospitalization and ongoing recovery are:: to get better   Choice offered to / list presented to : Patient  Discharge Placement                       Discharge Plan and Services In-house Referral: NA   Post Acute Care Choice: Home Health                               Social Determinants of Health (SDOH) Interventions     Readmission Risk Interventions     No data to display

## 2021-10-20 NOTE — Discharge Summary (Addendum)
Physician Discharge Summary  Patient ID: Sylvia [name] MRN: [id number] DOB/AGE: [birth year] 66 y.o.  Admit date: 10/13/2021 Discharge date: 10/20/2021  Admission Diagnoses:  Discharge Diagnoses:  Principal Problem:   Atherosclerosis of artery of extremity with rest pain [facility])   Discharged Condition: good  Hospital Course: 66 yo s/p left femoral endarterectomy following lysis for left leg ischemia  Consults: None   Discharge Exam: Blood pressure 138/70, pulse 99, temperature 99.9 F (37.7 C), resp. rate 19, height 5\' 7"  (1.702 m), weight 63.5 kg, SpO2 97 %. Provena to left groin  Disposition:  There are no questions and answers to display.         Allergies as of 10/20/2021       Reactions   Vancomycin Rash   Patient developed a rash to injection site and arm a few mintes after starting ABX.    Hydrocodone Rash   Metformin And Related Diarrhea   Penicillins Hives   Has patient had a PCN reaction causing immediate rash, facial/tongue/throat swelling, SOB or lightheadedness with hypotension: Yes Has patient had a PCN reaction causing severe rash involving mucus membranes or skin necrosis: No Has patient had a PCN reaction that required hospitalization: No Has patient had a PCN reaction occurring within the last 10 years: No If all of the above answers are "NO", then may proceed with Cephalosporin use.   Tramadol Itching        Medication List     TAKE these medications    acetaminophen 325 MG tablet Commonly known as: TYLENOL Take 650 mg by mouth every 6 (six) hours as needed.   albuterol 108 (90 Base) MCG/ACT inhaler Commonly known as: VENTOLIN HFA Inhale 2 puffs into the lungs every 6 (six) hours as needed for wheezing or shortness of breath.   aspirin 81 MG chewable tablet Chew 1 tablet (81 mg total) by mouth daily.   atorvastatin 10 MG tablet Commonly known as: LIPITOR Take 10 mg by mouth daily.   buPROPion 150 MG 12 hr tablet Commonly known  as: ZYBAN Take 150 mg by mouth 2 (two) times daily.   cetirizine 10 MG tablet Commonly known as: ZYRTEC Take 10 mg by mouth daily as needed for allergies.   clopidogrel 75 MG tablet Commonly known as: PLAVIX Take 1 tablet (75 mg total) by mouth daily.   docusate sodium 100 MG capsule Commonly known as: COLACE Take 100 mg by mouth 2 (two) times daily.   DULoxetine 30 MG capsule Commonly known as: CYMBALTA Take 30 mg by mouth daily.   Eliquis 2.5 MG Tabs tablet Generic drug: apixaban TAKE 1 TABLET BY MOUTH  TWICE DAILY   famotidine 20 MG tablet Commonly known as: Pepcid Take 1 tablet (20 mg total) by mouth at bedtime.   ferrous sulfate 325 (65 FE) MG tablet Take 325 mg by mouth daily.   folic acid 1 MG tablet Commonly known as: FOLVITE Take 1 mg by mouth daily.   gabapentin 300 MG capsule Commonly known as: NEURONTIN TAKE 2 CAPSULES BY MOUTH IN THE  MORNING AND 3 CAPSULES BY MOUTH  IN THE EVENING   hydroxychloroquine 200 MG tablet Commonly known as: PLAQUENIL Take 200 mg by mouth daily.   Jardiance 10 MG Tabs tablet Generic drug: empagliflozin Take 10 mg by mouth daily.   lisinopril 10 MG tablet Commonly known as: ZESTRIL Take 1 tablet (10 mg total) by mouth daily.   oxyCODONE 5 MG immediate release tablet Commonly known as: Roxicodone Take 1 tablet (  5 mg total) by mouth every 8 (eight) hours as needed.   oxyCODONE-acetaminophen 5-325 MG tablet Commonly known as: PERCOCET/ROXICET Take 1 tablet by mouth every 6 (six) hours as needed for moderate pain.   pantoprazole 40 MG tablet Commonly known as: PROTONIX Take 1 tablet (40 mg total) by mouth daily.   pantoprazole 20 MG tablet Commonly known as: PROTONIX Take 1 tablet (20 mg total) by mouth 2 (two) times daily before a meal.   predniSONE 5 MG tablet Commonly known as: DELTASONE Take 5 mg by mouth daily.   sitaGLIPtin 100 MG tablet Commonly known as: JANUVIA Take 100 mg by mouth daily.   Victoza  18 MG/3ML Sopn Generic drug: liraglutide Inject into the skin.   vitamin B-12 500 MCG tablet Commonly known as: CYANOCOBALAMIN Take 1 tablet (500 mcg total) by mouth daily.   WOMENS MULTIVITAMIN PO Take 1 tablet by mouth daily.         Signed: Wells Asriel Westrup 10/20/2021, 8:49 AM

## 2021-10-29 ENCOUNTER — Other Ambulatory Visit (INDEPENDENT_AMBULATORY_CARE_PROVIDER_SITE_OTHER): Payer: Self-pay | Admitting: Vascular Surgery

## 2021-10-29 DIAGNOSIS — I70222 Atherosclerosis of native arteries of extremities with rest pain, left leg: Secondary | ICD-10-CM

## 2021-10-29 DIAGNOSIS — Z9862 Peripheral vascular angioplasty status: Secondary | ICD-10-CM

## 2021-10-30 ENCOUNTER — Ambulatory Visit (INDEPENDENT_AMBULATORY_CARE_PROVIDER_SITE_OTHER): Payer: Medicare Other | Admitting: Nurse Practitioner

## 2021-10-30 ENCOUNTER — Encounter (INDEPENDENT_AMBULATORY_CARE_PROVIDER_SITE_OTHER): Payer: Self-pay | Admitting: Nurse Practitioner

## 2021-10-30 ENCOUNTER — Ambulatory Visit (INDEPENDENT_AMBULATORY_CARE_PROVIDER_SITE_OTHER): Payer: Medicare Other

## 2021-10-30 ENCOUNTER — Telehealth (INDEPENDENT_AMBULATORY_CARE_PROVIDER_SITE_OTHER): Payer: Self-pay

## 2021-10-30 ENCOUNTER — Encounter (INDEPENDENT_AMBULATORY_CARE_PROVIDER_SITE_OTHER): Payer: Medicare Other

## 2021-10-30 VITALS — BP 136/77 | HR 111 | Resp 16

## 2021-10-30 DIAGNOSIS — I70222 Atherosclerosis of native arteries of extremities with rest pain, left leg: Secondary | ICD-10-CM

## 2021-10-30 DIAGNOSIS — E119 Type 2 diabetes mellitus without complications: Secondary | ICD-10-CM

## 2021-10-30 DIAGNOSIS — E785 Hyperlipidemia, unspecified: Secondary | ICD-10-CM

## 2021-10-30 DIAGNOSIS — Z9862 Peripheral vascular angioplasty status: Secondary | ICD-10-CM

## 2021-10-30 NOTE — Telephone Encounter (Signed)
Patient was seen on today and scheduled for a RLE angio with Dr. Wyn Quaker on 11/02/21 with a 1:30 pm arrival time to the [facility]. Pre-procedure instructions were discussed and handed to the patient.

## 2021-11-02 ENCOUNTER — Ambulatory Visit (HOSPITAL_BASED_OUTPATIENT_CLINIC_OR_DEPARTMENT_OTHER)
Admission: RE | Admit: 2021-11-02 | Discharge: 2021-11-02 | Disposition: A | Discharge status: Home / Self Care | Payer: Medicare Other | Source: Home / Self Care | Attending: Vascular Surgery | Admitting: Vascular Surgery

## 2021-11-02 ENCOUNTER — Encounter (INDEPENDENT_AMBULATORY_CARE_PROVIDER_SITE_OTHER): Payer: Self-pay | Admitting: Nurse Practitioner

## 2021-11-02 ENCOUNTER — Other Ambulatory Visit (INDEPENDENT_AMBULATORY_CARE_PROVIDER_SITE_OTHER): Payer: Self-pay | Admitting: Nurse Practitioner

## 2021-11-02 ENCOUNTER — Encounter
Admission: RE | Disposition: A | Discharge status: Home / Self Care | Payer: Self-pay | Source: Home / Self Care | Attending: Vascular Surgery

## 2021-11-02 DIAGNOSIS — Z7985 Long-term (current) use of injectable non-insulin antidiabetic drugs: Secondary | ICD-10-CM | POA: Insufficient documentation

## 2021-11-02 DIAGNOSIS — T82856A Stenosis of peripheral vascular stent, initial encounter: Secondary | ICD-10-CM

## 2021-11-02 DIAGNOSIS — Z7984 Long term (current) use of oral hypoglycemic drugs: Secondary | ICD-10-CM | POA: Insufficient documentation

## 2021-11-02 DIAGNOSIS — E785 Hyperlipidemia, unspecified: Secondary | ICD-10-CM | POA: Insufficient documentation

## 2021-11-02 DIAGNOSIS — I70221 Atherosclerosis of native arteries of extremities with rest pain, right leg: Secondary | ICD-10-CM

## 2021-11-02 DIAGNOSIS — I70222 Atherosclerosis of native arteries of extremities with rest pain, left leg: Secondary | ICD-10-CM | POA: Insufficient documentation

## 2021-11-02 DIAGNOSIS — E1151 Type 2 diabetes mellitus with diabetic peripheral angiopathy without gangrene: Secondary | ICD-10-CM | POA: Insufficient documentation

## 2021-11-02 DIAGNOSIS — Z9889 Other specified postprocedural states: Secondary | ICD-10-CM

## 2021-11-02 DIAGNOSIS — Z95828 Presence of other vascular implants and grafts: Secondary | ICD-10-CM | POA: Diagnosis not present

## 2021-11-02 DIAGNOSIS — I70229 Atherosclerosis of native arteries of extremities with rest pain, unspecified extremity: Secondary | ICD-10-CM | POA: Diagnosis not present

## 2021-11-02 DIAGNOSIS — Z87891 Personal history of nicotine dependence: Secondary | ICD-10-CM | POA: Insufficient documentation

## 2021-11-02 HISTORY — PX: LOWER EXTREMITY ANGIOGRAPHY: CATH118251

## 2021-11-02 LAB — GLUCOSE, CAPILLARY
Glucose-Capillary: 193 mg/dL — ABNORMAL HIGH (ref 70–99)
Glucose-Capillary: 215 mg/dL — ABNORMAL HIGH (ref 70–99)

## 2021-11-02 LAB — CREATININE, SERUM
Creatinine, Ser: 0.97 mg/dL (ref 0.44–1.00)
GFR, Estimated: >60 mL/min (ref >60)

## 2021-11-02 LAB — BUN: BUN: 11 mg/dL (ref 8–23)

## 2021-11-02 SURGERY — LOWER EXTREMITY ANGIOGRAPHY
Anesthesia: Moderate Sedation | Site: Leg Lower | Laterality: Left

## 2021-11-02 MED ORDER — MIDAZOLAM HCL 2 MG/ML PO SYRP: 8.0000 mg | ORAL_SOLUTION | Freq: Once | ORAL | Status: DC | PRN

## 2021-11-02 MED ORDER — ONDANSETRON HCL 4 MG/2ML IJ SOLN: 4.0000 mg | Freq: Four times a day (QID) | INTRAMUSCULAR | Status: DC | PRN

## 2021-11-02 MED ORDER — IODIXANOL 320 MG/ML IV SOLN
INTRAVENOUS | Status: DC | PRN
  Administered 2021-11-02: 35 mL via INTRA_ARTERIAL

## 2021-11-02 MED ORDER — METHYLPREDNISOLONE SODIUM SUCC 125 MG IJ SOLR: 125.0000 mg | Freq: Once | INTRAMUSCULAR | Status: DC | PRN

## 2021-11-02 MED ORDER — SODIUM CHLORIDE 0.9 % IV SOLN: INTRAVENOUS | Status: DC

## 2021-11-02 MED ORDER — MIDAZOLAM HCL 2 MG/2ML IJ SOLN
INTRAMUSCULAR | Status: AC
  Filled 2021-11-02: qty 2

## 2021-11-02 MED ORDER — FAMOTIDINE 20 MG PO TABS: 40.0000 mg | ORAL_TABLET | Freq: Once | ORAL | Status: DC | PRN

## 2021-11-02 MED ORDER — HEPARIN SODIUM (PORCINE) 1000 UNIT/ML IJ SOLN
INTRAMUSCULAR | Status: DC | PRN
  Administered 2021-11-02: 4000 [IU] via INTRAVENOUS

## 2021-11-02 MED ORDER — CIPROFLOXACIN IN D5W 400 MG/200ML IV SOLN: 400.0000 mg | INTRAVENOUS | Status: AC

## 2021-11-02 MED ORDER — FENTANYL CITRATE PF 50 MCG/ML IJ SOSY
PREFILLED_SYRINGE | INTRAMUSCULAR | Status: AC
  Filled 2021-11-02: qty 1

## 2021-11-02 MED ORDER — OXYCODONE HCL 5 MG PO TABS: 5.0000 mg | ORAL_TABLET | Freq: Four times a day (QID) | ORAL | 0 refills | Status: DC | PRN

## 2021-11-02 MED ORDER — FENTANYL CITRATE (PF) 100 MCG/2ML IJ SOLN
INTRAMUSCULAR | Status: DC | PRN
  Administered 2021-11-02: 50 ug via INTRAVENOUS
  Administered 2021-11-02 (×2): 25 ug via INTRAVENOUS

## 2021-11-02 MED ORDER — HYDROMORPHONE HCL 1 MG/ML IJ SOLN
1.0000 mg | Freq: Once | INTRAMUSCULAR | Status: AC | PRN
  Administered 2021-11-02: 1 mg via INTRAVENOUS

## 2021-11-02 MED ORDER — HYDROMORPHONE HCL 1 MG/ML IJ SOLN
INTRAMUSCULAR | Status: AC
  Filled 2021-11-02: qty 1

## 2021-11-02 MED ORDER — MIDAZOLAM HCL 2 MG/2ML IJ SOLN
INTRAMUSCULAR | Status: AC
  Filled 2021-11-02: qty 4

## 2021-11-02 MED ORDER — CIPROFLOXACIN IN D5W 400 MG/200ML IV SOLN
INTRAVENOUS | Status: AC
  Administered 2021-11-02: 400 mg via INTRAVENOUS
  Filled 2021-11-02: qty 200

## 2021-11-02 MED ORDER — FENTANYL CITRATE (PF) 100 MCG/2ML IJ SOLN
INTRAMUSCULAR | Status: AC
  Filled 2021-11-02: qty 2

## 2021-11-02 MED ORDER — MIDAZOLAM HCL 2 MG/2ML IJ SOLN
INTRAMUSCULAR | Status: DC | PRN
  Administered 2021-11-02 (×2): .5 mg via INTRAVENOUS
  Administered 2021-11-02: 2 mg via INTRAVENOUS

## 2021-11-02 MED ORDER — FENTANYL CITRATE (PF) 100 MCG/2ML IJ SOLN: 12.5000 ug | Freq: Once | INTRAMUSCULAR | Status: DC | PRN

## 2021-11-02 SURGICAL SUPPLY — 14 items
BALLN LUTONIX DCB 7X60X130 (BALLOONS) ×2
BALLOON LUTONIX DCB 7X60X130 (BALLOONS) IMPLANT
CATH ANGIO 5F PIGTAIL 65CM (CATHETERS) ×1 IMPLANT
COVER PROBE U/S 5X48 (MISCELLANEOUS) ×1 IMPLANT
DEVICE STARCLOSE SE CLOSURE (Vascular Products) ×1 IMPLANT
GLIDEWIRE ADV .035X260CM (WIRE) ×1 IMPLANT
INTRODUCER 7FR 23CM (INTRODUCER) ×1 IMPLANT
KIT ENCORE 26 ADVANTAGE (KITS) ×1 IMPLANT
PACK ANGIOGRAPHY (CUSTOM PROCEDURE TRAY) ×2 IMPLANT
SHEATH BRITE TIP 5FRX11 (SHEATH) ×1 IMPLANT
STENT LIFESTREAM 9X38X80 (Permanent Stent) ×1 IMPLANT
SYR MEDRAD MARK 7 150ML (SYRINGE) ×1 IMPLANT
TUBING CONTRAST HIGH PRESS 72 (TUBING) ×1 IMPLANT
WIRE GUIDERIGHT .035X150 (WIRE) ×1 IMPLANT

## 2021-11-02 NOTE — Progress Notes (Signed)
 Subjective:    Patient ID: Sylvia [name], female    DOB: 05/18/1955, 65 y.o.   MRN: [id number] Chief Complaint  Patient presents with   Follow-up    [facility] 3 week follow up    Sylvia [name] is a 65-year-old female who is well-known to our practice.  Patient presents today for follow-up evaluation after recent left femoral endarterectomy.  Within the last 6 weeks the patient has underwent multiple revascularization attempts that have failed to maintain patency.  Patient notes that she has been having severe pain in her left lower extremity consistent with previous ischemic symptoms.  She notes rest pain like symptoms.  She also notes that her foot is cool.  Today, studies note that the SFA/popliteal stents are occluded.  Patient has dampened monophasic flow to the tibials with blunted flow to the toes    Review of Systems  Gastrointestinal:  Negative for blood in stool.  All other systems reviewed and are negative.      Objective:   Physical Exam Vitals reviewed.  HENT:     Head: Normocephalic.  Cardiovascular:     Rate and Rhythm: Normal rate.     Pulses:          Dorsalis pedis pulses are 0 on the left side.       Posterior tibial pulses are 0 on the left side.  Pulmonary:     Effort: Pulmonary effort is normal.  Musculoskeletal:     Right Lower Extremity: Right leg is amputated below knee.  Skin:    General: Skin is warm and dry.  Neurological:     Mental Status: She is alert and oriented to person, place, and time.  Psychiatric:        Mood and Affect: Mood normal.        Behavior: Behavior normal.        Thought Content: Thought content normal.        Judgment: Judgment normal.     BP 136/77 (BP Location: Right Arm)   Pulse (!) 111   Resp 16   Past Medical History:  Diagnosis Date   Anemia of chronic disease 05/16/2019   Anxiety    h/o   COPD (chronic obstructive pulmonary disease) (HCC)    Diabetes mellitus without complication (HCC)    GERD  (gastroesophageal reflux disease)    Hypertension    bp under control-off meds since 2019    Social History   Socioeconomic History   Marital status: Divorced    Spouse name: Not on file   Number of children: 2   Years of education: Not on file   Highest education level: Not on file  Occupational History   Not on file  Tobacco Use   Smoking status: Former    Packs/day: 0.25    Years: 45.00    Total pack years: 11.25    Types: Cigarettes   Smokeless tobacco: Never   Tobacco comments:    quit  Vaping Use   Vaping Use: Never used  Substance and Sexual Activity   Alcohol use: No   Drug use: Not Currently    Comment: last used in April 2021 per patient   Sexual activity: Not Currently  Other Topics Concern   Not on file  Social History Narrative   5 grandchildren and 5 great grandchildren    2 grandchildren live with Pt. (25 & 23 y.o.)   Social Determinants of Health   Financial Resource Strain: Low Risk  (06/14/2018)     Overall Financial Resource Strain (CARDIA)    Difficulty of Paying Living Expenses: Not hard at all  Food Insecurity: Food Insecurity Present (06/14/2018)   Hunger Vital Sign    Worried About Running Out of Food in the Last Year: Sometimes true    Ran Out of Food in the Last Year: Sometimes true  Transportation Needs: No Transportation Needs (06/14/2018)   PRAPARE - Transportation    Lack of Transportation (Medical): No    Lack of Transportation (Non-Medical): No  Physical Activity: Insufficiently Active (06/14/2018)   Exercise Vital Sign    Days of Exercise per Week: 4 days    Minutes of Exercise per Session: 20 min  Stress: No Stress Concern Present (06/14/2018)   [employer] - Occupational Stress Questionnaire    Feeling of Stress : Only a little  Social Connections: Unknown (06/14/2018)   Social Connection and Isolation Panel [NHANES]    Frequency of Communication with Friends and Family: Patient refused    Frequency of  Social Gatherings with Friends and Family: Patient refused    Attends Religious Services: Patient refused    Active Member of Clubs or Organizations: Patient refused    Attends Club or Organization Meetings: Patient refused    Marital Status: Patient refused  Intimate Partner Violence: Unknown (06/14/2018)   Humiliation, Afraid, Rape, and Kick questionnaire    Fear of Current or Ex-Partner: Patient refused    Emotionally Abused: Patient refused    Physically Abused: Patient refused    Sexually Abused: Patient refused    Past Surgical History:  Procedure Laterality Date   ABDOMINAL HYSTERECTOMY     AMPUTATION Right 12/27/2018   Procedure: AMPUTATION BELOW KNEE;  Surgeon: Dew, Jason S, MD;  Location: [facility];  Service: General;  Laterality: Right;   APPLICATION OF WOUND VAC  10/16/2021   Procedure: APPLICATION OF WOUND VAC;  Surgeon: Dew, Jason S, MD;  Location: [facility];  Service: Vascular;;   CATARACT EXTRACTION W/PHACO Right 03/27/2020   Procedure: CATARACT EXTRACTION PHACO AND INTRAOCULAR LENS PLACEMENT (IOC) RIGHT DIABETIC 7.54 00:52.5;  Surgeon: Porfilio, William, MD;  Location: [facility];  Service: Ophthalmology;  Laterality: Right;   CATARACT EXTRACTION W/PHACO Left 05/20/2020   Procedure: CATARACT EXTRACTION PHACO AND INTRAOCULAR LENS PLACEMENT (IOC) LEFT DIABETIC 4.95 00:37.6;  Surgeon: Porfilio, William, MD;  Location: [facility];  Service: Ophthalmology;  Laterality: Left;  Diabetic - oral meds COVID + 04-24-20   CHOLECYSTECTOMY     COLONOSCOPY WITH PROPOFOL N/A 04/16/2019   Procedure: COLONOSCOPY WITH PROPOFOL;  Surgeon: Tahiliani, Varnita B, MD;  Location: [facility];  Service: Endoscopy;  Laterality: N/A;   COLONOSCOPY WITH PROPOFOL N/A 01/16/2020   Procedure: COLONOSCOPY WITH PROPOFOL;  Surgeon: Tahiliani, Varnita B, MD;  Location: [facility];  Service: Endoscopy;  Laterality: N/A;   ENDARTERECTOMY FEMORAL Left 10/16/2021   Procedure: ENDARTERECTOMY  FEMORAL;  Surgeon: Dew, Jason S, MD;  Location: [facility];  Service: Vascular;  Laterality: Left;   ESOPHAGOGASTRODUODENOSCOPY (EGD) WITH PROPOFOL N/A 06/15/2018   Procedure: ESOPHAGOGASTRODUODENOSCOPY (EGD) WITH PROPOFOL;  Surgeon: Toledo, Teodoro K, MD;  Location: [facility];  Service: Gastroenterology;  Laterality: N/A;   ESOPHAGOGASTRODUODENOSCOPY (EGD) WITH PROPOFOL N/A 01/03/2019   Procedure: ESOPHAGOGASTRODUODENOSCOPY (EGD) WITH PROPOFOL;  Surgeon: Wohl, Darren, MD;  Location: [facility];  Service: Endoscopy;  Laterality: N/A;   ESOPHAGOGASTRODUODENOSCOPY (EGD) WITH PROPOFOL N/A 09/25/2021   Procedure: ESOPHAGOGASTRODUODENOSCOPY (EGD) WITH PROPOFOL;  Surgeon: Wohl, Darren, MD;  Location: [facility];  Service: Endoscopy;  Laterality:   N/A;   GIVENS CAPSULE STUDY  04/16/2019   Procedure: GIVENS CAPSULE STUDY;  Surgeon: Tahiliani, Varnita B, MD;  Location: [facility];  Service: Endoscopy;;   GIVENS CAPSULE STUDY N/A 06/25/2019   Procedure: GIVENS CAPSULE STUDY;  Surgeon: Anna, Kiran, MD;  Location: [facility];  Service: Gastroenterology;  Laterality: N/A;   LOWER EXTREMITY ANGIOGRAPHY Right 08/21/2018   Procedure: LOWER EXTREMITY ANGIOGRAPHY;  Surgeon: Dew, Jason S, MD;  Location: [facility];  Service: Cardiovascular;  Laterality: Right;   LOWER EXTREMITY ANGIOGRAPHY Left 08/28/2018   Procedure: LOWER EXTREMITY ANGIOGRAPHY;  Surgeon: Dew, Jason S, MD;  Location: [facility];  Service: Cardiovascular;  Laterality: Left;   LOWER EXTREMITY ANGIOGRAPHY Right 08/28/2018   Procedure: Lower Extremity Angiography;  Surgeon: Dew, Jason S, MD;  Location: [facility];  Service: Cardiovascular;  Laterality: Right;   LOWER EXTREMITY ANGIOGRAPHY Right 12/18/2018   Procedure: Lower Extremity Angiography;  Surgeon: Dew, Jason S, MD;  Location: [facility];  Service: Cardiovascular;  Laterality: Right;   LOWER EXTREMITY ANGIOGRAPHY Right 12/18/2018   Procedure: Lower  Extremity Angiography;  Surgeon: Dew, Jason S, MD;  Location: [facility];  Service: Cardiovascular;  Laterality: Right;   LOWER EXTREMITY ANGIOGRAPHY Left 12/21/2018   Procedure: Lower Extremity Angiography;  Surgeon: Dew, Jason S, MD;  Location: [facility];  Service: Cardiovascular;  Laterality: Left;   LOWER EXTREMITY ANGIOGRAPHY Right 12/21/2018   Procedure: Lower Extremity Angiography;  Surgeon: Dew, Jason S, MD;  Location: [facility];  Service: Cardiovascular;  Laterality: Right;   LOWER EXTREMITY ANGIOGRAPHY Left 12/25/2020   Procedure: LOWER EXTREMITY ANGIOGRAPHY;  Surgeon: Dew, Jason S, MD;  Location: [facility];  Service: Cardiovascular;  Laterality: Left;   LOWER EXTREMITY ANGIOGRAPHY Left 09/21/2021   Procedure: Lower Extremity Angiography;  Surgeon: Dew, Jason S, MD;  Location: [facility];  Service: Cardiovascular;  Laterality: Left;   LOWER EXTREMITY ANGIOGRAPHY Left 10/13/2021   Procedure: Lower Extremity Angiography;  Surgeon: Schnier, Gregory G, MD;  Location: [facility];  Service: Cardiovascular;  Laterality: Left;   LOWER EXTREMITY ANGIOGRAPHY Left 10/14/2021   Procedure: Lower Extremity Angiography;  Surgeon: Dew, Jason S, MD;  Location: [facility];  Service: Cardiovascular;  Laterality: Left;   LOWER EXTREMITY INTERVENTION N/A 12/22/2018   Procedure: LOWER EXTREMITY INTERVENTION;  Surgeon: Dew, Jason S, MD;  Location: [facility];  Service: Cardiovascular;  Laterality: N/A;   LOWER EXTREMITY INTERVENTION Left 12/26/2020   Procedure: LOWER EXTREMITY INTERVENTION;  Surgeon: Dew, Jason S, MD;  Location: [facility];  Service: Cardiovascular;  Laterality: Left;   LOWER EXTREMITY INTERVENTION N/A 09/22/2021   Procedure: LOWER EXTREMITY INTERVENTION;  Surgeon: Dew, Jason S, MD;  Location: [facility];  Service: Cardiovascular;  Laterality: N/A;    Family History  Problem Relation Age of Onset    Breast cancer Mother 73    Allergies  Allergen Reactions   Vancomycin Rash    Patient developed a rash to injection site and arm a few mintes after starting ABX.    Hydrocodone Rash   Metformin And Related Diarrhea   Penicillins Hives    Has patient had a PCN reaction causing immediate rash, facial/tongue/throat swelling, SOB or lightheadedness with hypotension: Yes Has patient had a PCN reaction causing severe rash involving mucus membranes or skin necrosis: No Has patient had a PCN reaction that required hospitalization: No Has patient   had a PCN reaction occurring within the last 10 years: No If all of the above answers are "NO", then may proceed with Cephalosporin use.   Tramadol Itching       Latest Ref Rng & Units 10/20/2021    5:00 AM 10/19/2021   12:51 AM 10/18/2021    3:19 AM  CBC  WBC 4.0 - 10.5 K/uL 9.5  10.0  13.2   Hemoglobin 12.0 - 15.0 g/dL 8.2  8.1  8.1   Hematocrit 36.0 - 46.0 % 24.8  24.9  24.5   Platelets 150 - 400 K/uL 298  267  241       CMP     Component Value Date/Time   NA 138 10/20/2021 0500   K 4.3 10/20/2021 0500   CL 108 10/20/2021 0500   CO2 24 10/20/2021 0500   GLUCOSE 218 (H) 10/20/2021 0500   BUN 25 (H) 10/20/2021 0500   CREATININE 0.84 10/20/2021 0500   CALCIUM 9.3 10/20/2021 0500   PROT 6.9 09/27/2021 1034   ALBUMIN 2.9 (L) 10/20/2021 0500   AST 116 (H) 09/27/2021 1034   ALT 66 (H) 09/27/2021 1034   ALKPHOS 98 09/27/2021 1034   BILITOT 0.6 09/27/2021 1034   GFRNONAA >60 10/20/2021 0500   GFRAA >60 11/23/2019 1505     VAS US ABI WITH/WO TBI  Result Date: 09/18/2021  LOWER EXTREMITY DOPPLER STUDY Patient Name:  Donell Hildebrandt  Date of Exam:   09/17/2021 Medical Rec #: [id number]           Accession #:    [id number] Date of Birth: 09/05/1955            Patient Gender: F Patient Age:   65 years Exam Location:  [facility] Procedure:      VAS US ABI WITH/WO TBI Referring Phys: Jason Dew  --------------------------------------------------------------------------------  Indications: Peripheral artery disease, and right BKA.  Vascular Interventions: 08/21/18: Right SFA/popliteal artery PTA/stent;                         08/28/18: Left EIA & SFA/popliteal artery stents;                         08/28/18: Right popliteal, TP trunk & peroneal artery                         thrombectomies/PTAs;                         12/27/18: Right BKA;                         12/25/20: Left SFA/popliteal thrombectomy/PTA;                         12/26/20: Left proximal SFA thombectomy/stent;. Comparison Study: 07/21/2021 Performing Technologist: Solomon Mcclary RVS  Examination Guidelines: A complete evaluation includes at minimum, Doppler waveform signals and systolic blood pressure reading at the level of bilateral brachial, anterior tibial, and posterior tibial arteries, when vessel segments are accessible. Bilateral testing is considered an integral part of a complete examination. Photoelectric Plethysmograph (PPG) waveforms and toe systolic pressure readings are included as required and additional duplex testing as needed. Limited examinations for reoccurring indications may be performed as noted.  ABI Findings: +--------+------------------+-----+--------+--------+ Right   Rt Pressure (mmHg)IndexWaveformComment  +--------+------------------+-----+--------+--------+   Brachial152                                     +--------+------------------+-----+--------+--------+ ATA                                    Rt BKA   +--------+------------------+-----+--------+--------+ +---------+------------------+-----+----------+-------+ Left     Lt Pressure (mmHg)IndexWaveform  Comment +---------+------------------+-----+----------+-------+ Brachial 154                                      +---------+------------------+-----+----------+-------+ ATA      73                0.47 monophasic         +---------+------------------+-----+----------+-------+ PTA      67                0.44 monophasic        +---------+------------------+-----+----------+-------+ Great Toe39                0.25 Abnormal          +---------+------------------+-----+----------+-------+ +-------+-----------+-----------+------------+------------+ ABI/TBIToday's ABIToday's TBIPrevious ABIPrevious TBI +-------+-----------+-----------+------------+------------+ Right  Rt BKA                RT BKA                   +-------+-----------+-----------+------------+------------+ Left   .47        .25        .93         .96          +-------+-----------+-----------+------------+------------+ Left ABIs and TBIs appear decreased compared to prior study on 07/21/2021.  Summary: Right: Rt BKA. Left: Resting left ankle-brachial index indicates severe left lower extremity arterial disease. The left toe-brachial index is abnormal.  *See table(s) above for measurements and observations.  Electronically signed by Jason Dew MD on 09/18/2021 at 11:42:58 AM.    Final        Assessment & Plan:   1. 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  And less than 6 weeks the patient has underwent multiple revascularizations and these have all failed to remain open.  This includes a left femoral endarterectomy.  Based on this we are doubtful that any attempt at revascularization will be successful.  However, we believe that it is important to obtain revascularization for the purpose of healing a possible, and likely amputation.  We discussed the risk, benefits and alternatives and the patient is agreeable to proceed with a left lower extremity angiogram. - VAS US ABI WITH/WO TBI  2. Hyperlipidemia, unspecified hyperlipidemia type Continue statin as ordered and reviewed, no changes at this time   3. Diabetes mellitus  without complication (HCC) Continue hypoglycemic medications as already ordered, these medications have been reviewed and there are no changes at this time.  Hgb A1C to be monitored as already arranged by primary service    Current Outpatient Medications on File Prior to Visit  Medication Sig Dispense Refill   acetaminophen (TYLENOL) 325 MG tablet Take 650 mg by mouth every 6 (six) hours as needed.     albuterol (PROVENTIL HFA;VENTOLIN HFA) 108 (90 Base)   MCG/ACT inhaler Inhale 2 puffs into the lungs every 6 (six) hours as needed for wheezing or shortness of breath. 1 Inhaler 2   apixaban (ELIQUIS) 5 MG TABS tablet Take 1 tablet (5 mg total) by mouth 2 (two) times daily. 60 tablet 12   aspirin 81 MG chewable tablet Chew 1 tablet (81 mg total) by mouth daily. 30 tablet 11   atorvastatin (LIPITOR) 10 MG tablet Take 10 mg by mouth daily.     buPROPion (ZYBAN) 150 MG 12 hr tablet Take 150 mg by mouth 2 (two) times daily.     docusate sodium (COLACE) 100 MG capsule Take 100 mg by mouth 2 (two) times daily.     DULoxetine (CYMBALTA) 30 MG capsule Take 30 mg by mouth daily.     famotidine (PEPCID) 20 MG tablet Take 1 tablet (20 mg total) by mouth at bedtime. 30 tablet 3   ferrous sulfate 325 (65 FE) MG tablet Take 325 mg by mouth daily.      folic acid (FOLVITE) 1 MG tablet Take 1 mg by mouth daily.     gabapentin (NEURONTIN) 300 MG capsule TAKE 2 CAPSULES BY MOUTH IN THE  MORNING AND 3 CAPSULES BY MOUTH  IN THE EVENING 450 capsule 3   hydroxychloroquine (PLAQUENIL) 200 MG tablet Take 200 mg by mouth daily.     JARDIANCE 10 MG TABS tablet Take 10 mg by mouth daily.     lisinopril (ZESTRIL) 10 MG tablet Take 1 tablet (10 mg total) by mouth daily. 30 tablet 1   pantoprazole (PROTONIX) 20 MG tablet Take 1 tablet (20 mg total) by mouth 2 (two) times daily before a meal. 28 tablet 0   pantoprazole (PROTONIX) 40 MG tablet Take 1 tablet (40 mg total) by mouth daily. 30 tablet 11   sitaGLIPtin (JANUVIA) 100  MG tablet Take 100 mg by mouth daily.     VICTOZA 18 MG/3ML SOPN Inject into the skin.     vitamin B-12 (CYANOCOBALAMIN) 500 MCG tablet Take 1 tablet (500 mcg total) by mouth daily. 90 tablet 1   cetirizine (ZYRTEC) 10 MG tablet Take 10 mg by mouth daily as needed for allergies. (Patient not taking: Reported on 10/30/2021)     clopidogrel (PLAVIX) 75 MG tablet Take 1 tablet (75 mg total) by mouth daily. (Patient not taking: Reported on 10/30/2021) 90 tablet 3   Multiple Vitamins-Minerals (WOMENS MULTIVITAMIN PO) Take 1 tablet by mouth daily. (Patient not taking: Reported on 10/30/2021)     oxyCODONE (OXY IR/ROXICODONE) 5 MG immediate release tablet Take 1 tablet (5 mg total) by mouth every 8 (eight) hours as needed for severe pain. (Patient not taking: Reported on 10/30/2021) 20 tablet 0   oxyCODONE-acetaminophen (PERCOCET/ROXICET) 5-325 MG tablet Take 1 tablet by mouth every 6 (six) hours as needed for moderate pain. (Patient not taking: Reported on 10/30/2021) 30 tablet 0   predniSONE (DELTASONE) 5 MG tablet Take 5 mg by mouth daily. (Patient not taking: Reported on 10/14/2021)     No current facility-administered medications on file prior to visit.    There are no Patient Instructions on file for this visit. No follow-ups on file.   Sharay Bellissimo E Jaydan Chretien, NP   

## 2021-11-02 NOTE — Op Note (Signed)
[name]  Percutaneous Study/Intervention Procedural Note   Date of Surgery: 11/02/2021  Surgeon(s):Annarose Ouellet    Assistants:none  Pre-operative Diagnosis: PAD with rest [name] left lower extremity  Post-operative diagnosis:  Same  Procedure(s) Performed:             1.  Ultrasound guidance for vascular access right femoral artery             2.  Catheter placement into left common femoral artery from right femoral approach             3.  Aortogram and selective left lower extremity angiogram             4.  Percutaneous transluminal angioplasty of left proximal common femoral artery and distal external iliac artery with 7 mm diameter by 6 cm length [name]             5.  Stent placement to the left common iliac artery with 9 mm diameter by 38 mm length lifestream stent  6.  StarClose closure device right femoral artery  EBL: 5 cc  Contrast: 35 cc  Fluoro Time: 3.1 minutes  Moderate Conscious Sedation Time: approximately 31 minutes using 3 mg of Versed and 100 mcg of Fentanyl              Indications:  Patient is a 66 y.o.female with severe PAD and ischemic rest [name]. The patient has noninvasive study showing occlusion of her SFA stents again. The patient is brought in for angiography for further evaluation and potential treatment.  Due to the limb threatening nature of the situation, angiogram was performed for attempted limb salvage. The patient is aware that if the procedure fails, amputation would be expected.  The patient also understands that even with successful revascularization, amputation may still be required due to the severity of the situation. Risks and benefits are discussed and informed consent is obtained.   Procedure:  The patient was identified and appropriate procedural time out was performed.  The patient was then placed supine on the table and prepped and draped in the usual sterile fashion. Moderate  conscious sedation was administered during a face to face encounter with the patient throughout the procedure with my supervision of the RN administering medicines and monitoring the patient's vital signs, pulse oximetry, telemetry and mental status throughout from the start of the procedure until the patient was taken to the recovery room. Ultrasound was used to evaluate the right common femoral artery.  It was patent .  A digital ultrasound image was acquired.  A Seldinger needle was used to access the right common femoral artery under direct ultrasound guidance and a permanent image was performed.  A 0.035 J wire was advanced without resistance and a 5Fr sheath was placed.  Pigtail catheter was placed into the aorta and an AP aortogram was performed. This demonstrated normal renal arteries and normal aorta and diffusely irregular right iliac segments without obvious stenosis.  The left common iliac artery has had narrowing that has been borderline stenosis but with her current SFA occlusion that makes [place] suspect this is a significant lesion hampering inflow.  The previously placed stent was patent.  There was then some plaque creating a greater than 50% stenosis in the proximal left common femoral artery at the top of her previous endarterectomy site. I then crossed the aortic bifurcation and advanced to the left femoral head. Selective left lower extremity angiogram was then performed. This  demonstrated greater than 50% stenosis in the proximal left common femoral artery at the top of her previous endarterectomy site.  The patch itself is widely patent.  The profunda femoris artery was patent without significant stenosis.  The SFA stents were all occluded.  There were large profunda collaterals feeding to the reconstitution of the tibioperoneal trunk and posterior tibial artery which was then continuous to the foot. It was felt that it was in the patient's best interest to proceed with intervention after these  images to avoid a second procedure and a larger amount of contrast and fluoroscopy based off of the findings from the initial angiogram.  I did not feel like treating her SFA stents was likely to be durable, but I would treat her inflow lesions and then we can consider a femoral to posterior tibial bypass if she has acceptable vein.  The patient was systemically heparinized and a 7 French 23 cm sheath was then placed over the Terumo Advantage wire. I then used a Kumpe catheter and the advantage wire to navigate down into the profunda femoris artery for purchase.  I treated the left common iliac artery with a 9 mm diameter by 38 mm length lifestream stent inflated to 8 atm.  Completion imaging showed less than 10% residual stenosis.  I then addressed the proximal common femoral artery stenosis with a 7 mm diameter by 6 cm length [name] in the distal external iliac artery and proximal common femoral artery taken up to 12 atm for 1 minute.  Completion imaging showed only about a 20 to 25% residual stenosis after angioplasty. I elected to terminate the procedure. The sheath was removed and StarClose closure device was deployed in the right femoral artery with excellent hemostatic result. The patient was taken to the recovery room in stable condition having tolerated the procedure well.  Findings:               Aortogram:  This demonstrated normal renal arteries and normal aorta and diffusely irregular right iliac segments without obvious stenosis.  The left common iliac artery has had narrowing that has been borderline stenosis but with her current SFA occlusion that makes [place] suspect this is a significant lesion hampering inflow.  The previously placed stent was patent.  There was then some plaque creating a greater than 50% stenosis in the proximal left common femoral artery at the top of her previous endarterectomy site.             Left lower Extremity: Greater than 50% stenosis  in the proximal left common femoral artery at the top of her previous endarterectomy site.  The patch itself is widely patent.  The profunda femoris artery was patent without significant stenosis.  The SFA stents were all occluded.  There were large profunda collaterals feeding to the reconstitution of the tibioperoneal trunk and posterior tibial artery which was then continuous to the foot   Disposition: Patient was taken to the recovery room in stable condition having tolerated the procedure well.  Complications: None  Sylvia [name] 11/02/2021 2:05 PM   This note was created with Dragon Medical transcription system. Any errors in dictation are purely unintentional.

## 2021-11-02 NOTE — Progress Notes (Signed)
Pt. Med. With 1.0 mg Dilaudid slow IVP for c/o 10/10 pain to "entire left leg.: it's aching and annoying." Intermittent dop pulse to Left DP and Dop. Strong pulse to Left PT sites.

## 2021-11-02 NOTE — Interval H&P Note (Signed)
History and Physical Interval Note:  11/02/2021 11:56 AM  Sylvia [name]  has presented today for surgery, with the diagnosis of LLE Angio   BARD   ASO w rest pain.  The various methods of treatment have been discussed with the patient and family. After consideration of risks, benefits and other options for treatment, the patient has consented to  Procedure(s): Lower Extremity Angiography (Left) as a surgical intervention.  The patient's history has been reviewed, patient examined, no change in status, stable for surgery.  I have reviewed the patient's chart and labs.  Questions were answered to the patient's satisfaction.     [name]

## 2021-11-02 NOTE — H&P (View-Only) (Signed)
Subjective:    Patient ID: Sylvia [name], female    DOB: [birth year], 66 y.o.   MRN: [id number] Chief Complaint  Patient presents with   Follow-up    [facility] 3 week follow up    Sylvia [name] is a 66 year old female who is well-known to our practice.  Patient presents today for follow-up evaluation after recent left femoral endarterectomy.  Within the last 6 weeks the patient has underwent multiple revascularization attempts that have failed to maintain patency.  Patient notes that she has been having severe pain in her left lower extremity consistent with previous ischemic symptoms.  She notes rest pain like symptoms.  She also notes that her foot is cool.  Today, studies note that the SFA/popliteal stents are occluded.  Patient has dampened monophasic flow to the tibials with blunted flow to the toes    Review of Systems  Gastrointestinal:  Negative for blood in stool.  All other systems reviewed and are negative.      Objective:   Physical Exam Vitals reviewed.  HENT:     Head: Normocephalic.  Cardiovascular:     Rate and Rhythm: Normal rate.     Pulses:          Dorsalis pedis pulses are 0 on the left side.       Posterior tibial pulses are 0 on the left side.  Pulmonary:     Effort: Pulmonary effort is normal.  Musculoskeletal:     Right Lower Extremity: Right leg is amputated below knee.  Skin:    General: Skin is warm and dry.  Neurological:     Mental Status: She is alert and oriented to person, place, and time.  Psychiatric:        Mood and Affect: Mood normal.        Behavior: Behavior normal.        Thought Content: Thought content normal.        Judgment: Judgment normal.     BP 136/77 (BP Location: Right Arm)   Pulse (!) 111   Resp 16   Past Medical History:  Diagnosis Date   Anemia of chronic disease 05/16/2019   Anxiety    h/o   COPD (chronic obstructive pulmonary disease) (HCC)    Diabetes mellitus without complication (HCC)    GERD  (gastroesophageal reflux disease)    Hypertension    bp under control-off meds since 2019    Social History   Socioeconomic History   Marital status: Divorced    Spouse name: Not on file   Number of children: 2   Years of education: Not on file   Highest education level: Not on file  Occupational History   Not on file  Tobacco Use   Smoking status: Former    Packs/day: 0.25    Years: 45.00    Total pack years: 11.25    Types: Cigarettes   Smokeless tobacco: Never   Tobacco comments:    quit  Vaping Use   Vaping Use: Never used  Substance and Sexual Activity   Alcohol use: No   Drug use: Not Currently    Comment: last used in April 2021 per patient   Sexual activity: Not Currently  Other Topics Concern   Not on file  Social History Narrative   5 grandchildren and 5 great grandchildren    2 grandchildren live with Pt. (25 & 60 y.o.)   Social Determinants of Health   Financial Resource Strain: Low Risk  (06/14/2018)  Overall Financial Resource Strain (CARDIA)    Difficulty of Paying Living Expenses: Not hard at all  Food Insecurity: Food Insecurity Present (06/14/2018)   Hunger Vital Sign    Worried About Running Out of Food in the Last Year: Sometimes true    Ran Out of Food in the Last Year: Sometimes true  Transportation Needs: No Transportation Needs (06/14/2018)   PRAPARE - Administrator, Civil Service (Medical): No    Lack of Transportation (Non-Medical): No  Physical Activity: Insufficiently Active (06/14/2018)   Exercise Vital Sign    Days of Exercise per Week: 4 days    Minutes of Exercise per Session: 20 min  Stress: No Stress Concern Present (06/14/2018)   [employer] of Occupational Health - Occupational Stress Questionnaire    Feeling of Stress : Only a little  Social Connections: Unknown (06/14/2018)   Social Connection and Isolation Panel [NHANES]    Frequency of Communication with Friends and Family: Patient refused    Frequency of  Social Gatherings with Friends and Family: Patient refused    Attends Religious Services: Patient refused    Active Member of Clubs or Organizations: Patient refused    Attends Banker Meetings: Patient refused    Marital Status: Patient refused  Intimate Partner Violence: Unknown (06/14/2018)   Humiliation, Afraid, Rape, and Kick questionnaire    Fear of Current or Ex-Partner: Patient refused    Emotionally Abused: Patient refused    Physically Abused: Patient refused    Sexually Abused: Patient refused    Past Surgical History:  Procedure Laterality Date   ABDOMINAL HYSTERECTOMY     AMPUTATION Right 12/27/2018   Procedure: AMPUTATION BELOW KNEE;  Surgeon: Annice Needy, MD;  Location: [facility];  Service: General;  Laterality: Right;   APPLICATION OF WOUND VAC  10/16/2021   Procedure: APPLICATION OF WOUND VAC;  Surgeon: Annice Needy, MD;  Location: [facility];  Service: Vascular;;   CATARACT EXTRACTION W/PHACO Right 03/27/2020   Procedure: CATARACT EXTRACTION PHACO AND INTRAOCULAR LENS PLACEMENT (IOC) RIGHT DIABETIC 7.54 00:52.5;  Surgeon: Galen Manila, MD;  Location: [facility];  Service: Ophthalmology;  Laterality: Right;   CATARACT EXTRACTION W/PHACO Left 05/20/2020   Procedure: CATARACT EXTRACTION PHACO AND INTRAOCULAR LENS PLACEMENT (IOC) LEFT DIABETIC 4.95 00:37.6;  Surgeon: Galen Manila, MD;  Location: [facility];  Service: Ophthalmology;  Laterality: Left;  Diabetic - oral meds COVID + 04-24-20   CHOLECYSTECTOMY     COLONOSCOPY WITH PROPOFOL N/A 04/16/2019   Procedure: COLONOSCOPY WITH PROPOFOL;  Surgeon: Pasty Spillers, MD;  Location: [facility];  Service: Endoscopy;  Laterality: N/A;   COLONOSCOPY WITH PROPOFOL N/A 01/16/2020   Procedure: COLONOSCOPY WITH PROPOFOL;  Surgeon: Pasty Spillers, MD;  Location: [facility];  Service: Endoscopy;  Laterality: N/A;   ENDARTERECTOMY FEMORAL Left 10/16/2021   Procedure: ENDARTERECTOMY  FEMORAL;  Surgeon: Annice Needy, MD;  Location: [facility];  Service: Vascular;  Laterality: Left;   ESOPHAGOGASTRODUODENOSCOPY (EGD) WITH PROPOFOL N/A 06/15/2018   Procedure: ESOPHAGOGASTRODUODENOSCOPY (EGD) WITH PROPOFOL;  Surgeon: Toledo, Boykin Nearing, MD;  Location: [facility];  Service: Gastroenterology;  Laterality: N/A;   ESOPHAGOGASTRODUODENOSCOPY (EGD) WITH PROPOFOL N/A 01/03/2019   Procedure: ESOPHAGOGASTRODUODENOSCOPY (EGD) WITH PROPOFOL;  Surgeon: Midge Minium, MD;  Location: [facility];  Service: Endoscopy;  Laterality: N/A;   ESOPHAGOGASTRODUODENOSCOPY (EGD) WITH PROPOFOL N/A 09/25/2021   Procedure: ESOPHAGOGASTRODUODENOSCOPY (EGD) WITH PROPOFOL;  Surgeon: Midge Minium, MD;  Location: [facility];  Service: Endoscopy;  Laterality:  N/A;   GIVENS CAPSULE STUDY  04/16/2019   Procedure: GIVENS CAPSULE STUDY;  Surgeon: Pasty Spillers, MD;  Location: [facility];  Service: Endoscopy;;   GIVENS CAPSULE STUDY N/A 06/25/2019   Procedure: GIVENS CAPSULE STUDY;  Surgeon: Wyline Mood, MD;  Location: [facility];  Service: Gastroenterology;  Laterality: N/A;   LOWER EXTREMITY ANGIOGRAPHY Right 08/21/2018   Procedure: LOWER EXTREMITY ANGIOGRAPHY;  Surgeon: Annice Needy, MD;  Location: [facility];  Service: Cardiovascular;  Laterality: Right;   LOWER EXTREMITY ANGIOGRAPHY Left 08/28/2018   Procedure: LOWER EXTREMITY ANGIOGRAPHY;  Surgeon: Annice Needy, MD;  Location: [facility];  Service: Cardiovascular;  Laterality: Left;   LOWER EXTREMITY ANGIOGRAPHY Right 08/28/2018   Procedure: Lower Extremity Angiography;  Surgeon: Annice Needy, MD;  Location: [facility];  Service: Cardiovascular;  Laterality: Right;   LOWER EXTREMITY ANGIOGRAPHY Right 12/18/2018   Procedure: Lower Extremity Angiography;  Surgeon: Annice Needy, MD;  Location: [facility];  Service: Cardiovascular;  Laterality: Right;   LOWER EXTREMITY ANGIOGRAPHY Right 12/18/2018   Procedure: Lower  Extremity Angiography;  Surgeon: Annice Needy, MD;  Location: [facility];  Service: Cardiovascular;  Laterality: Right;   LOWER EXTREMITY ANGIOGRAPHY Left 12/21/2018   Procedure: Lower Extremity Angiography;  Surgeon: Annice Needy, MD;  Location: [facility];  Service: Cardiovascular;  Laterality: Left;   LOWER EXTREMITY ANGIOGRAPHY Right 12/21/2018   Procedure: Lower Extremity Angiography;  Surgeon: Annice Needy, MD;  Location: [facility];  Service: Cardiovascular;  Laterality: Right;   LOWER EXTREMITY ANGIOGRAPHY Left 12/25/2020   Procedure: LOWER EXTREMITY ANGIOGRAPHY;  Surgeon: Annice Needy, MD;  Location: [facility];  Service: Cardiovascular;  Laterality: Left;   LOWER EXTREMITY ANGIOGRAPHY Left 09/21/2021   Procedure: Lower Extremity Angiography;  Surgeon: Annice Needy, MD;  Location: [facility];  Service: Cardiovascular;  Laterality: Left;   LOWER EXTREMITY ANGIOGRAPHY Left 10/13/2021   Procedure: Lower Extremity Angiography;  Surgeon: Renford Dills, MD;  Location: [facility];  Service: Cardiovascular;  Laterality: Left;   LOWER EXTREMITY ANGIOGRAPHY Left 10/14/2021   Procedure: Lower Extremity Angiography;  Surgeon: Annice Needy, MD;  Location: [facility];  Service: Cardiovascular;  Laterality: Left;   LOWER EXTREMITY INTERVENTION N/A 12/22/2018   Procedure: LOWER EXTREMITY INTERVENTION;  Surgeon: Annice Needy, MD;  Location: [facility];  Service: Cardiovascular;  Laterality: N/A;   LOWER EXTREMITY INTERVENTION Left 12/26/2020   Procedure: LOWER EXTREMITY INTERVENTION;  Surgeon: Annice Needy, MD;  Location: [facility];  Service: Cardiovascular;  Laterality: Left;   LOWER EXTREMITY INTERVENTION N/A 09/22/2021   Procedure: LOWER EXTREMITY INTERVENTION;  Surgeon: Annice Needy, MD;  Location: [facility];  Service: Cardiovascular;  Laterality: N/A;    Family History  Problem Relation Age of Onset    Breast cancer Mother 67    Allergies  Allergen Reactions   Vancomycin Rash    Patient developed a rash to injection site and arm a few mintes after starting ABX.    Hydrocodone Rash   Metformin And Related Diarrhea   Penicillins Hives    Has patient had a PCN reaction causing immediate rash, facial/tongue/throat swelling, SOB or lightheadedness with hypotension: Yes Has patient had a PCN reaction causing severe rash involving mucus membranes or skin necrosis: No Has patient had a PCN reaction that required hospitalization: No Has patient  had a PCN reaction occurring within the last 10 years: No If all of the above answers are "NO", then may proceed with Cephalosporin use.   Tramadol Itching       Latest Ref Rng & Units 10/20/2021    5:00 AM 10/19/2021   12:51 AM 10/18/2021    3:19 AM  CBC  WBC 4.0 - 10.5 K/uL 9.5  10.0  13.2   Hemoglobin 12.0 - 15.0 g/dL 8.2  8.1  8.1   Hematocrit 36.0 - 46.0 % 24.8  24.9  24.5   Platelets 150 - 400 K/uL 298  267  241       CMP     Component Value Date/Time   NA 138 10/20/2021 0500   K 4.3 10/20/2021 0500   CL 108 10/20/2021 0500   CO2 24 10/20/2021 0500   GLUCOSE 218 (H) 10/20/2021 0500   BUN 25 (H) 10/20/2021 0500   CREATININE 0.84 10/20/2021 0500   CALCIUM 9.3 10/20/2021 0500   PROT 6.9 09/27/2021 1034   ALBUMIN 2.9 (L) 10/20/2021 0500   AST 116 (H) 09/27/2021 1034   ALT 66 (H) 09/27/2021 1034   ALKPHOS 98 09/27/2021 1034   BILITOT 0.6 09/27/2021 1034   GFRNONAA >60 10/20/2021 0500   GFRAA >60 11/23/2019 1505     VAS [place] ABI WITH/WO TBI  Result Date: 09/18/2021  LOWER EXTREMITY DOPPLER STUDY Patient Name:  Tabbitha Janvrin  Date of Exam:   09/17/2021 Medical Rec #: [id number]           Accession #:    [id number] Date of Birth: 12/13/1955            Patient Gender: F Patient Age:   33 years Exam Location:  [facility] Procedure:      VAS [place] ABI WITH/WO TBI Referring Phys: [name]  --------------------------------------------------------------------------------  Indications: Peripheral artery disease, and right BKA.  Vascular Interventions: 08/21/18: Right SFA/popliteal artery PTA/stent;                         08/28/18: Left EIA & SFA/popliteal artery stents;                         08/28/18: Right popliteal, TP trunk & peroneal artery                         thrombectomies/PTAs;                         12/27/18: Right BKA;                         12/25/20: Left SFA/popliteal thrombectomy/PTA;                         12/26/20: Left proximal SFA thombectomy/stent;. Comparison Study: 07/21/2021 Performing Technologist: Debbe Bales RVS  Examination Guidelines: A complete evaluation includes at minimum, Doppler waveform signals and systolic blood pressure reading at the level of bilateral brachial, anterior tibial, and posterior tibial arteries, when vessel segments are accessible. Bilateral testing is considered an integral part of a complete examination. Photoelectric Plethysmograph (PPG) waveforms and toe systolic pressure readings are included as required and additional duplex testing as needed. Limited examinations for reoccurring indications may be performed as noted.  ABI Findings: +--------+------------------+-----+--------+--------+ Right   Rt Pressure (mmHg)IndexWaveformComment  +--------+------------------+-----+--------+--------+  [id number]                                     +--------+------------------+-----+--------+--------+ ATA                                    Rt BKA   +--------+------------------+-----+--------+--------+ +---------+------------------+-----+----------+-------+ Left     Lt Pressure (mmHg)IndexWaveform  Comment +---------+------------------+-----+----------+-------+ Brachial 154                                      +---------+------------------+-----+----------+-------+ ATA      73                0.47 monophasic         +---------+------------------+-----+----------+-------+ PTA      67                0.44 monophasic        +---------+------------------+-----+----------+-------+ Great Toe39                0.25 Abnormal          +---------+------------------+-----+----------+-------+ +-------+-----------+-----------+------------+------------+ ABI/TBIToday's ABIToday's TBIPrevious ABIPrevious TBI +-------+-----------+-----------+------------+------------+ Right  Rt BKA                RT BKA                   +-------+-----------+-----------+------------+------------+ Left   .47        .25        .93         .96          +-------+-----------+-----------+------------+------------+ Left ABIs and TBIs appear decreased compared to prior study on 07/21/2021.  Summary: Right: Rt BKA. Left: Resting left ankle-brachial index indicates severe left lower extremity arterial disease. The left toe-brachial index is abnormal.  *See table(s) above for measurements and observations.  Electronically signed by [name] MD on 09/18/2021 at 11:42:58 AM.    Final        Assessment & Plan:   1. 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  And less than 6 weeks the patient has underwent multiple revascularizations and these have all failed to remain open.  This includes a left femoral endarterectomy.  Based on this we are doubtful that any attempt at revascularization will be successful.  However, we believe that it is important to obtain revascularization for the purpose of healing a possible, and likely amputation.  We discussed the risk, benefits and alternatives and the patient is agreeable to proceed with a left lower extremity angiogram. - VAS [place] ABI WITH/WO TBI  2. Hyperlipidemia, unspecified hyperlipidemia type Continue statin as ordered and reviewed, no changes at this time   3. Diabetes mellitus  without complication (HCC) Continue hypoglycemic medications as already ordered, these medications have been reviewed and there are no changes at this time.  Hgb A1C to be monitored as already arranged by primary service    Current Outpatient Medications on File Prior to Visit  Medication Sig Dispense Refill   acetaminophen (TYLENOL) 325 MG tablet Take 650 mg by mouth every 6 (six) hours as needed.     albuterol (PROVENTIL HFA;VENTOLIN HFA) 108 (90 Base)  MCG/ACT inhaler Inhale 2 puffs into the lungs every 6 (six) hours as needed for wheezing or shortness of breath. 1 Inhaler 2   apixaban (ELIQUIS) 5 MG TABS tablet Take 1 tablet (5 mg total) by mouth 2 (two) times daily. 60 tablet 12   aspirin 81 MG chewable tablet Chew 1 tablet (81 mg total) by mouth daily. 30 tablet 11   atorvastatin (LIPITOR) 10 MG tablet Take 10 mg by mouth daily.     buPROPion (ZYBAN) 150 MG 12 hr tablet Take 150 mg by mouth 2 (two) times daily.     docusate sodium (COLACE) 100 MG capsule Take 100 mg by mouth 2 (two) times daily.     DULoxetine (CYMBALTA) 30 MG capsule Take 30 mg by mouth daily.     famotidine (PEPCID) 20 MG tablet Take 1 tablet (20 mg total) by mouth at bedtime. 30 tablet 3   ferrous sulfate 325 (65 FE) MG tablet Take 325 mg by mouth daily.      folic acid (FOLVITE) 1 MG tablet Take 1 mg by mouth daily.     gabapentin (NEURONTIN) 300 MG capsule TAKE 2 CAPSULES BY MOUTH IN THE  MORNING AND 3 CAPSULES BY MOUTH  IN THE EVENING 450 capsule 3   hydroxychloroquine (PLAQUENIL) 200 MG tablet Take 200 mg by mouth daily.     JARDIANCE 10 MG TABS tablet Take 10 mg by mouth daily.     lisinopril (ZESTRIL) 10 MG tablet Take 1 tablet (10 mg total) by mouth daily. 30 tablet 1   pantoprazole (PROTONIX) 20 MG tablet Take 1 tablet (20 mg total) by mouth 2 (two) times daily before a meal. 28 tablet 0   pantoprazole (PROTONIX) 40 MG tablet Take 1 tablet (40 mg total) by mouth daily. 30 tablet 11   sitaGLIPtin (JANUVIA) 100  MG tablet Take 100 mg by mouth daily.     VICTOZA 18 MG/3ML SOPN Inject into the skin.     vitamin B-12 (CYANOCOBALAMIN) 500 MCG tablet Take 1 tablet (500 mcg total) by mouth daily. 90 tablet 1   cetirizine (ZYRTEC) 10 MG tablet Take 10 mg by mouth daily as needed for allergies. (Patient not taking: Reported on 10/30/2021)     clopidogrel (PLAVIX) 75 MG tablet Take 1 tablet (75 mg total) by mouth daily. (Patient not taking: Reported on 10/30/2021) 90 tablet 3   Multiple Vitamins-Minerals (WOMENS MULTIVITAMIN PO) Take 1 tablet by mouth daily. (Patient not taking: Reported on 10/30/2021)     oxyCODONE (OXY IR/ROXICODONE) 5 MG immediate release tablet Take 1 tablet (5 mg total) by mouth every 8 (eight) hours as needed for severe pain. (Patient not taking: Reported on 10/30/2021) 20 tablet 0   oxyCODONE-acetaminophen (PERCOCET/ROXICET) 5-325 MG tablet Take 1 tablet by mouth every 6 (six) hours as needed for moderate pain. (Patient not taking: Reported on 10/30/2021) 30 tablet 0   predniSONE (DELTASONE) 5 MG tablet Take 5 mg by mouth daily. (Patient not taking: Reported on 10/14/2021)     No current facility-administered medications on file prior to visit.    There are no Patient Instructions on file for this visit. No follow-ups on file.   Georgiana Spinner, NP

## 2021-11-02 NOTE — Progress Notes (Signed)
Dr. Wyn Quaker at bedside, speaking with pt. And her spouse re:   procedural results. Both verbalize understanding of conversation.

## 2021-11-03 ENCOUNTER — Emergency Department: Payer: Medicare Other

## 2021-11-03 ENCOUNTER — Encounter: Payer: Self-pay | Admitting: Vascular Surgery

## 2021-11-03 ENCOUNTER — Emergency Department
Admission: EM | Admit: 2021-11-03 | Discharge: 2021-11-03 | Disposition: A | Discharge status: Home / Self Care | Payer: Medicare Other | Source: Home / Self Care | Attending: Emergency Medicine | Admitting: Emergency Medicine

## 2021-11-03 ENCOUNTER — Other Ambulatory Visit (INDEPENDENT_AMBULATORY_CARE_PROVIDER_SITE_OTHER): Payer: Self-pay | Admitting: Vascular Surgery

## 2021-11-03 ENCOUNTER — Ambulatory Visit (INDEPENDENT_AMBULATORY_CARE_PROVIDER_SITE_OTHER): Payer: Medicare Other | Admitting: Nurse Practitioner

## 2021-11-03 ENCOUNTER — Ambulatory Visit (INDEPENDENT_AMBULATORY_CARE_PROVIDER_SITE_OTHER): Payer: Medicare Other

## 2021-11-03 ENCOUNTER — Other Ambulatory Visit: Payer: Self-pay

## 2021-11-03 DIAGNOSIS — R112 Nausea with vomiting, unspecified: Secondary | ICD-10-CM | POA: Insufficient documentation

## 2021-11-03 DIAGNOSIS — D72829 Elevated white blood cell count, unspecified: Secondary | ICD-10-CM | POA: Insufficient documentation

## 2021-11-03 DIAGNOSIS — I10 Essential (primary) hypertension: Secondary | ICD-10-CM | POA: Insufficient documentation

## 2021-11-03 DIAGNOSIS — R1013 Epigastric pain: Secondary | ICD-10-CM | POA: Insufficient documentation

## 2021-11-03 DIAGNOSIS — R197 Diarrhea, unspecified: Secondary | ICD-10-CM | POA: Insufficient documentation

## 2021-11-03 DIAGNOSIS — D649 Anemia, unspecified: Secondary | ICD-10-CM | POA: Insufficient documentation

## 2021-11-03 DIAGNOSIS — Z01810 Encounter for preprocedural cardiovascular examination: Secondary | ICD-10-CM

## 2021-11-03 DIAGNOSIS — R110 Nausea: Secondary | ICD-10-CM

## 2021-11-03 DIAGNOSIS — R000 Tachycardia, unspecified: Secondary | ICD-10-CM | POA: Insufficient documentation

## 2021-11-03 DIAGNOSIS — E1165 Type 2 diabetes mellitus with hyperglycemia: Secondary | ICD-10-CM | POA: Insufficient documentation

## 2021-11-03 DIAGNOSIS — I70222 Atherosclerosis of native arteries of extremities with rest pain, left leg: Secondary | ICD-10-CM

## 2021-11-03 DIAGNOSIS — J449 Chronic obstructive pulmonary disease, unspecified: Secondary | ICD-10-CM | POA: Insufficient documentation

## 2021-11-03 DIAGNOSIS — E876 Hypokalemia: Secondary | ICD-10-CM

## 2021-11-03 DIAGNOSIS — I998 Other disorder of circulatory system: Secondary | ICD-10-CM

## 2021-11-03 LAB — CBC WITH DIFFERENTIAL/PLATELET
Abs Immature Granulocytes: 0.07 10*3/uL (ref 0.00–0.07)
Basophils Absolute: 0.1 10*3/uL (ref 0.0–0.1)
Basophils Relative: 1 %
Eosinophils Absolute: 0.1 10*3/uL (ref 0.0–0.5)
Eosinophils Relative: 1 %
HCT: 28.2 % — ABNORMAL LOW (ref 36.0–46.0)
Hemoglobin: 9.2 g/dL — ABNORMAL LOW (ref 12.0–15.0)
Immature Granulocytes: 1 %
Lymphocytes Relative: 10 %
Lymphs Abs: 1.5 10*3/uL (ref 0.7–4.0)
MCH: 27.3 pg (ref 26.0–34.0)
MCHC: 32.6 g/dL (ref 30.0–36.0)
MCV: 83.7 fL (ref 80.0–100.0)
Monocytes Absolute: 0.7 10*3/uL (ref 0.1–1.0)
Monocytes Relative: 5 %
Neutro Abs: 12.1 10*3/uL — ABNORMAL HIGH (ref 1.7–7.7)
Neutrophils Relative %: 82 %
Platelets: 425 10*3/uL — ABNORMAL HIGH (ref 150–400)
RBC: 3.37 MIL/uL — ABNORMAL LOW (ref 3.87–5.11)
RDW: 14.2 % (ref 11.5–15.5)
WBC: 14.5 10*3/uL — ABNORMAL HIGH (ref 4.0–10.5)
nRBC: 0 % (ref 0.0–0.2)

## 2021-11-03 LAB — URINALYSIS, ROUTINE W REFLEX MICROSCOPIC
Bacteria, UA: NONE SEEN
Bilirubin Urine: NEGATIVE
Glucose, UA: >=500 mg/dL — AB
Hgb urine dipstick: NEGATIVE
Ketones, ur: 20 mg/dL — AB
Leukocytes,Ua: NEGATIVE
Nitrite: NEGATIVE
Protein, ur: 30 mg/dL — AB
Specific Gravity, Urine: 1.02 (ref 1.005–1.030)
pH: 8 (ref 5.0–8.0)

## 2021-11-03 LAB — COMPREHENSIVE METABOLIC PANEL
ALT: 12 U/L (ref 0–44)
AST: 18 U/L (ref 15–41)
Albumin: 3.9 g/dL (ref 3.5–5.0)
Alkaline Phosphatase: 116 U/L (ref 38–126)
Anion gap: 14 (ref 5–15)
BUN: 10 mg/dL (ref 8–23)
CO2: 17 mmol/L — ABNORMAL LOW (ref 22–32)
Calcium: 9.9 mg/dL (ref 8.9–10.3)
Chloride: 109 mmol/L (ref 98–111)
Creatinine, Ser: 0.86 mg/dL (ref 0.44–1.00)
GFR, Estimated: >60 mL/min (ref >60)
Glucose, Bld: 251 mg/dL — ABNORMAL HIGH (ref 70–99)
Potassium: 3.3 mmol/L — ABNORMAL LOW (ref 3.5–5.1)
Sodium: 140 mmol/L (ref 135–145)
Total Bilirubin: 0.9 mg/dL (ref 0.3–1.2)
Total Protein: 8.3 g/dL — ABNORMAL HIGH (ref 6.5–8.1)

## 2021-11-03 LAB — LIPASE, BLOOD: Lipase: 29 U/L (ref 11–51)

## 2021-11-03 LAB — TROPONIN I (HIGH SENSITIVITY): Troponin I (High Sensitivity): 5 ng/L (ref <18)

## 2021-11-03 MED ORDER — METOCLOPRAMIDE HCL 5 MG/ML IJ SOLN
10.0000 mg | Freq: Once | INTRAMUSCULAR | Status: AC
  Administered 2021-11-03: 10 mg via INTRAVENOUS
  Filled 2021-11-03: qty 2

## 2021-11-03 MED ORDER — POTASSIUM CHLORIDE CRYS ER 20 MEQ PO TBCR
40.0000 meq | EXTENDED_RELEASE_TABLET | Freq: Once | ORAL | Status: AC
  Administered 2021-11-03: 40 meq via ORAL
  Filled 2021-11-03: qty 2

## 2021-11-03 MED ORDER — FAMOTIDINE IN NACL 20-0.9 MG/50ML-% IV SOLN
20.0000 mg | Freq: Once | INTRAVENOUS | Status: AC
  Administered 2021-11-03: 20 mg via INTRAVENOUS
  Filled 2021-11-03: qty 50

## 2021-11-03 MED ORDER — SODIUM CHLORIDE 0.9 % IV BOLUS
1000.0000 mL | Freq: Once | INTRAVENOUS | Status: AC
  Administered 2021-11-03: 1000 mL via INTRAVENOUS

## 2021-11-03 MED ORDER — IOHEXOL 300 MG/ML  SOLN
100.0000 mL | Freq: Once | INTRAMUSCULAR | Status: AC | PRN
  Administered 2021-11-03: 100 mL via INTRAVENOUS

## 2021-11-03 MED ORDER — SODIUM CHLORIDE 0.9 % IV BOLUS: 1000.0000 mL | Freq: Once | INTRAVENOUS | Status: DC

## 2021-11-03 MED ORDER — ONDANSETRON 4 MG PO TBDP: 4.0000 mg | ORAL_TABLET | Freq: Three times a day (TID) | ORAL | 0 refills | Status: DC | PRN

## 2021-11-03 MED ORDER — MORPHINE SULFATE (PF) 4 MG/ML IV SOLN
4.0000 mg | Freq: Once | INTRAVENOUS | Status: AC
  Administered 2021-11-03: 4 mg via INTRAVENOUS
  Filled 2021-11-03: qty 1

## 2021-11-03 MED ORDER — LIDOCAINE-EPINEPHRINE 2 %-1:100000 IJ SOLN: 20.0000 mL | Freq: Once | INTRAMUSCULAR | Status: DC

## 2021-11-03 MED ORDER — ONDANSETRON HCL 4 MG/2ML IJ SOLN: 4.0000 mg | Freq: Once | INTRAMUSCULAR | Status: DC

## 2021-11-03 MED ORDER — ONDANSETRON 4 MG PO TBDP
4.0000 mg | ORAL_TABLET | Freq: Three times a day (TID) | ORAL | 0 refills | Status: DC | PRN
Start: 2021-11-03 — End: 2021-11-03

## 2021-11-03 NOTE — ED Notes (Signed)
Pt refused second liter of fluid. She states she is ready to go home and will drink fluids at home

## 2021-11-03 NOTE — ED Notes (Signed)
Pt and husband refuse to sign for DC. States they are ready to leave and husband has to be at work

## 2021-11-03 NOTE — ED Triage Notes (Signed)
Pt had a stent placed in right femoral yesterday. Pt woke up this morning feeling fine. Pt began vomiting and not feeling well at 2 pm.

## 2021-11-03 NOTE — ED Provider Notes (Signed)
[facility] Provider Note    Event Date/Time   First MD Initiated Contact with Patient 11/03/21 1911     (approximate)   History   Nausea   HPI  Sylvia [name] is a 66 y.o. female past medical history of peripheral artery disease, status post right AKA, hypertension GERD diabetes COPD presents with abdominal pain nausea and vomiting.  Patient symptoms started around 2 PM today.  She endorses epigastric abdominal pain that is constant.  Multiple episodes of nonbloody nonbilious emesis.  Has had loose stool denies melena or blood in her stool.  No hematemesis.  Says pain feels similar to ulcer pain she had before.  Has chills but no fevers denies chest pain does feel somewhat short of breath.  Denies urinary symptoms.     Past Medical History:  Diagnosis Date  . Anemia of chronic disease 05/16/2019  . Anxiety    h/o  . COPD (chronic obstructive pulmonary disease) (HCC)   . Diabetes mellitus without complication (HCC)   . GERD (gastroesophageal reflux disease)   . Hypertension    bp under control-off meds since 2019    Patient Active Problem List   Diagnosis Date Noted  . Multiple duodenal ulcers   . Ischemic leg 12/25/2020  . 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 Diabetes mellitus without complication (HCC)   . GERD (gastroesophageal reflux disease)   . COPD (chronic obstructive pulmonary disease) (HCC)   . GIB (gastrointestinal bleeding)   . HLD (hyperlipidemia)   . Depression   . Elevated troponin   . Syncope   . Elevated troponin I level   . Anemia of chronic disease 05/16/2019  . Dizziness   . Polyp of colon   . Acute upper GI bleeding 04/13/2019  . Anemia associated with acute blood loss 04/13/2019  . Chronic anticoagulation 04/13/2019  . 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 Symptomatic anemia 04/13/2019  . Upper GI bleed 04/13/2019  . Acute GI bleeding 04/13/2019  . Phantom pain  after amputation of lower extremity (HCC) 01/30/2019  . Melena   . DU (duodenal ulcer)   . DKA (diabetic ketoacidoses) 12/14/2018  . Atherosclerosis of artery of extremity with rest pain (HCC) 08/28/2018  . PAD (peripheral artery disease) (HCC) 07/14/2018  . Abdominal pain 06/13/2018  . HTN (hypertension) 06/13/2018  . Non-insulin dependent type 2 diabetes mellitus (HCC) 06/13/2018  . Elevated lactic acid level 06/13/2018     Physical Exam  Triage Vital Signs: ED Triage Vitals  Enc Vitals Group     BP 11/03/21 1917 (!) 182/65     Pulse Rate 11/03/21 1917 87     Resp 11/03/21 1917 (!) 30     Temp 11/03/21 1917 98.4 F (36.9 C)     Temp Source 11/03/21 1917 Oral     SpO2 11/03/21 1913 97 %     Weight 11/03/21 1921 138 lb 14.2 oz (63 kg)     Height 11/03/21 1921 5\' 7"  (1.702 m)     Head Circumference --      Peak Flow --      Pain Score 11/03/21 1920 0     Pain Loc --      Pain Edu? --      Excl. in GC? --     Most recent vital signs: Vitals:   11/03/21 1916 11/03/21 1917  BP:  (!) 182/65  Pulse:  87  Resp:  (!) 30  Temp:  98.4 F (36.9 C)  SpO2: 100% 100%     General: Awake, no distress. *** CV:  Good peripheral perfusion. *** Resp:  Normal effort. *** Abd:  No distention. *** Neuro:             Awake, Alert, Oriented x 3 *** Other:  ***   ED Results / Procedures / Treatments  Labs (all labs ordered are listed, but only abnormal results are displayed) Labs Reviewed  COMPREHENSIVE METABOLIC PANEL  CBC WITH DIFFERENTIAL/PLATELET  LIPASE, BLOOD  TROPONIN I (HIGH SENSITIVITY)     EKG  ***   RADIOLOGY ***   PROCEDURES:  Critical Care performed: {CriticalCareYesNo:19197::"Yes, see critical care procedure note(s)","No"}  Procedures {If the patient is on the monitor, remove the brackets and asterisks. Otherwise delete the sentence below:1} The patient is on the cardiac monitor to evaluate for evidence of arrhythmia and/or significant heart rate  changes.   MEDICATIONS ORDERED IN ED: Medications - No data to display   IMPRESSION / MDM / ASSESSMENT AND PLAN / ED COURSE  I reviewed the triage vital signs and the nursing notes.                              Patient's presentation is most consistent with {EM COPA:27473}  Differential diagnosis includes, but is not limited to, ***       FINAL CLINICAL IMPRESSION(S) / ED DIAGNOSES   Final diagnoses:  None     Rx / DC Orders   ED Discharge Orders     None        Note:  This document was prepared using Dragon voice recognition software and may include unintentional dictation errors.

## 2021-11-03 NOTE — Discharge Instructions (Signed)
If you are unable to eat or drink or your abdominal pain returns please return to the emergency department.

## 2021-11-03 NOTE — ED Notes (Signed)
Assisted pt to toilet 

## 2021-11-04 ENCOUNTER — Telehealth (INDEPENDENT_AMBULATORY_CARE_PROVIDER_SITE_OTHER): Payer: Self-pay

## 2021-11-04 ENCOUNTER — Encounter
Admission: RE | Admit: 2021-11-04 | Discharge: 2021-11-04 | Disposition: A | Discharge status: Home / Self Care | Payer: Medicare Other | Source: Ambulatory Visit | Attending: Vascular Surgery | Admitting: Vascular Surgery

## 2021-11-04 ENCOUNTER — Other Ambulatory Visit (INDEPENDENT_AMBULATORY_CARE_PROVIDER_SITE_OTHER): Payer: Self-pay | Admitting: Nurse Practitioner

## 2021-11-04 DIAGNOSIS — I70222 Atherosclerosis of native arteries of extremities with rest pain, left leg: Secondary | ICD-10-CM

## 2021-11-04 DIAGNOSIS — Z01818 Encounter for other preprocedural examination: Secondary | ICD-10-CM

## 2021-11-04 HISTORY — DX: Cocaine abuse, uncomplicated: F14.10

## 2021-11-04 HISTORY — DX: Other disorder of circulatory system: I99.8

## 2021-11-04 HISTORY — DX: Unspecified osteoarthritis, unspecified site: M19.90

## 2021-11-04 HISTORY — DX: Depression, unspecified: F32.A

## 2021-11-04 HISTORY — DX: Type 2 diabetes mellitus with ketoacidosis without coma: E11.10

## 2021-11-04 HISTORY — DX: Gastrointestinal hemorrhage, unspecified: K92.2

## 2021-11-04 HISTORY — DX: Other specified abnormal findings of blood chemistry: R79.89

## 2021-11-04 NOTE — Patient Instructions (Signed)
Your procedure is scheduled on:11-05-21 Thursday Report to the Registration Desk on the 1st floor of the [facility].Then proceed to the 2nd floor Surgery Desk. Arrive at 10:00 am  REMEMBER: Instructions that are not followed completely may result in serious medical risk, up to and including death; or upon the discretion of your surgeon and anesthesiologist your surgery may need to be rescheduled.  Do not eat food OR drink any liquids after midnight the night before surgery.  No gum chewing, lozengers or hard candies.  TAKE THESE MEDICATIONS THE MORNING OF SURGERY WITH A SIP OF WATER: -buPROPion (ZYBAN) -gabapentin (NEURONTIN) -pantoprazole (PROTONIX) -You may take oxyCODONE (OXY IR/ROXICODONE) for pain if needed -You may take ondansetron (ZOFRAN-ODT) for nausea if needed  Use your Albuterol Inhaler the day of surgery and bring your Albuterol Inhaler to the hospital  Last dose of apixaban (ELIQUIS) was on 11-03-21 as instructed by Dr Wyn Quaker  Last dose of Aspirin was on 11-04-21  Stop your JARDIANCE NOW (11-04-21)-Last dose was on 11-03-21  One week prior to surgery: Stop Anti-inflammatories (NSAIDS) such as Advil, Aleve, Ibuprofen, Motrin, Naproxen, Naprosyn and Aspirin based products such as Excedrin, Goodys Powder, BC Powder. You may however, continue to take Tylenol if needed for pain up until the day of surgery.  No Alcohol for 24 hours before or after surgery.  No Smoking including e-cigarettes for 24 hours prior to surgery.  No chewable tobacco products for at least 6 hours prior to surgery.  No nicotine patches on the day of surgery.  Do not use any "recreational" drugs for at least a week prior to your surgery.  Please be advised that the combination of cocaine and anesthesia may have negative outcomes, up to and including death. If you test positive for cocaine, your surgery will be cancelled.  On the morning of surgery brush your teeth with toothpaste and water, you may rinse  your mouth with mouthwash if you wish. Do not swallow any toothpaste or mouthwash.  Do not wear jewelry, make-up, hairpins, clips or nail polish.  Do not wear lotions, powders, or perfumes.   Do not shave body from the neck down 48 hours prior to surgery just in case you cut yourself which could leave a site for infection.  Also, freshly shaved skin may become irritated if using the CHG soap.  Contact lenses, hearing aids and dentures may not be worn into surgery.  Do not bring valuables to the hospital. [facility] is not responsible for any missing/lost belongings or valuables.  Notify your doctor if there is any change in your medical condition (cold, fever, infection).  Wear comfortable clothing (specific to your surgery type) to the hospital.  After surgery, you can help prevent lung complications by doing breathing exercises.  Take deep breaths and cough every 1-2 hours. Your doctor may order a device called an Incentive Spirometer to help you take deep breaths. When coughing or sneezing, hold a pillow firmly against your incision with both hands. This is called "splinting." Doing this helps protect your incision. It also decreases belly discomfort.  If you are being admitted to the hospital overnight, leave your suitcase in the car. After surgery it may be brought to your room.  If you are being discharged the day of surgery, you will not be allowed to drive home. You will need a responsible adult (18 years or older) to drive you home and stay with you that night.   If you are taking public transportation, you  will need to have a responsible adult (18 years or older) with you. Please confirm with your physician that it is acceptable to use public transportation.   Please call the Pre-admissions Testing Dept. at [phone] if you have any questions about these instructions.  Surgery Visitation Policy:  Patients undergoing a surgery or procedure may have two family members  or support persons with them as long as the person is not COVID-19 positive or experiencing its symptoms.   Inpatient Visitation:    Visiting hours are 7 a.m. to 8 p.m. Up to four visitors are allowed at one time in a patient room, including children. The visitors may rotate out with other people during the day. One designated support person (adult) may remain overnight.

## 2021-11-05 ENCOUNTER — Inpatient Hospital Stay
Admission: RE | Admit: 2021-11-05 | Discharge: 2021-11-10 | DRG: 241 | Disposition: A | Discharge status: Home Health | Payer: Medicare Other | Attending: Vascular Surgery | Admitting: Vascular Surgery

## 2021-11-05 ENCOUNTER — Encounter: Payer: Self-pay | Admitting: Vascular Surgery

## 2021-11-05 ENCOUNTER — Encounter (INDEPENDENT_AMBULATORY_CARE_PROVIDER_SITE_OTHER): Payer: Self-pay | Admitting: Nurse Practitioner

## 2021-11-05 ENCOUNTER — Inpatient Hospital Stay: Payer: Medicare Other | Admitting: Anesthesiology

## 2021-11-05 ENCOUNTER — Encounter
Admission: RE | Disposition: A | Discharge status: Home / Self Care | Payer: Self-pay | Source: Home / Self Care | Attending: Vascular Surgery

## 2021-11-05 ENCOUNTER — Other Ambulatory Visit: Payer: Self-pay

## 2021-11-05 ENCOUNTER — Inpatient Hospital Stay: Payer: Medicare Other

## 2021-11-05 DIAGNOSIS — Z961 Presence of intraocular lens: Secondary | ICD-10-CM | POA: Diagnosis present

## 2021-11-05 DIAGNOSIS — Z7984 Long term (current) use of oral hypoglycemic drugs: Secondary | ICD-10-CM | POA: Diagnosis not present

## 2021-11-05 DIAGNOSIS — E785 Hyperlipidemia, unspecified: Secondary | ICD-10-CM | POA: Diagnosis present

## 2021-11-05 DIAGNOSIS — E1151 Type 2 diabetes mellitus with diabetic peripheral angiopathy without gangrene: Secondary | ICD-10-CM | POA: Diagnosis present

## 2021-11-05 DIAGNOSIS — Z89511 Acquired absence of right leg below knee: Secondary | ICD-10-CM | POA: Diagnosis not present

## 2021-11-05 DIAGNOSIS — Z87891 Personal history of nicotine dependence: Secondary | ICD-10-CM

## 2021-11-05 DIAGNOSIS — Z885 Allergy status to narcotic agent status: Secondary | ICD-10-CM | POA: Diagnosis not present

## 2021-11-05 DIAGNOSIS — I70222 Atherosclerosis of native arteries of extremities with rest pain, left leg: Secondary | ICD-10-CM | POA: Diagnosis present

## 2021-11-05 DIAGNOSIS — F32A Depression, unspecified: Secondary | ICD-10-CM | POA: Diagnosis present

## 2021-11-05 DIAGNOSIS — E876 Hypokalemia: Secondary | ICD-10-CM | POA: Diagnosis present

## 2021-11-05 DIAGNOSIS — F419 Anxiety disorder, unspecified: Secondary | ICD-10-CM | POA: Diagnosis present

## 2021-11-05 DIAGNOSIS — I10 Essential (primary) hypertension: Secondary | ICD-10-CM | POA: Diagnosis present

## 2021-11-05 DIAGNOSIS — Z880 Allergy status to penicillin: Secondary | ICD-10-CM

## 2021-11-05 DIAGNOSIS — J449 Chronic obstructive pulmonary disease, unspecified: Secondary | ICD-10-CM | POA: Diagnosis present

## 2021-11-05 DIAGNOSIS — Z79899 Other long term (current) drug therapy: Secondary | ICD-10-CM | POA: Diagnosis not present

## 2021-11-05 DIAGNOSIS — R000 Tachycardia, unspecified: Secondary | ICD-10-CM | POA: Diagnosis present

## 2021-11-05 DIAGNOSIS — Z9841 Cataract extraction status, right eye: Secondary | ICD-10-CM | POA: Diagnosis not present

## 2021-11-05 DIAGNOSIS — Z9842 Cataract extraction status, left eye: Secondary | ICD-10-CM

## 2021-11-05 DIAGNOSIS — Z888 Allergy status to other drugs, medicaments and biological substances status: Secondary | ICD-10-CM | POA: Diagnosis not present

## 2021-11-05 DIAGNOSIS — I70229 Atherosclerosis of native arteries of extremities with rest pain, unspecified extremity: Secondary | ICD-10-CM | POA: Diagnosis present

## 2021-11-05 DIAGNOSIS — Z7901 Long term (current) use of anticoagulants: Secondary | ICD-10-CM | POA: Diagnosis not present

## 2021-11-05 DIAGNOSIS — Z01818 Encounter for other preprocedural examination: Secondary | ICD-10-CM

## 2021-11-05 DIAGNOSIS — K219 Gastro-esophageal reflux disease without esophagitis: Secondary | ICD-10-CM | POA: Diagnosis present

## 2021-11-05 DIAGNOSIS — Z7982 Long term (current) use of aspirin: Secondary | ICD-10-CM

## 2021-11-05 HISTORY — PX: AMPUTATION: SHX166

## 2021-11-05 LAB — GLUCOSE, CAPILLARY
Glucose-Capillary: 160 mg/dL — ABNORMAL HIGH (ref 70–99)
Glucose-Capillary: 265 mg/dL — ABNORMAL HIGH (ref 70–99)
Glucose-Capillary: 215 mg/dL — ABNORMAL HIGH (ref 70–99)

## 2021-11-05 LAB — POCT I-STAT, CHEM 8
BUN: 9 mg/dL (ref 8–23)
Calcium, Ion: 1.24 mmol/L (ref 1.15–1.40)
Chloride: 109 mmol/L (ref 98–111)
Creatinine, Ser: 1 mg/dL (ref 0.44–1.00)
Glucose, Bld: 153 mg/dL — ABNORMAL HIGH (ref 70–99)
HCT: 27 % — ABNORMAL LOW (ref 36.0–46.0)
Hemoglobin: 9.2 g/dL — ABNORMAL LOW (ref 12.0–15.0)
Potassium: 3.6 mmol/L (ref 3.5–5.1)
Sodium: 144 mmol/L (ref 135–145)
TCO2: 22 mmol/L (ref 22–32)

## 2021-11-05 SURGERY — AMPUTATION BELOW KNEE
Anesthesia: General | Site: Knee | Laterality: Left

## 2021-11-05 MED ORDER — OXYCODONE HCL 5 MG PO TABS
5.0000 mg | ORAL_TABLET | Freq: Four times a day (QID) | ORAL | Status: DC | PRN
  Administered 2021-11-05 – 2021-11-10 (×8): 5 mg via ORAL
  Filled 2021-11-05 (×8): qty 1

## 2021-11-05 MED ORDER — HYDROMORPHONE HCL 1 MG/ML IJ SOLN
1.0000 mg | Freq: Once | INTRAMUSCULAR | Status: AC | PRN
  Administered 2021-11-05: 1 mg via INTRAVENOUS
  Filled 2021-11-05: qty 1

## 2021-11-05 MED ORDER — MIDAZOLAM HCL 2 MG/2ML IJ SOLN
INTRAMUSCULAR | Status: DC | PRN
  Administered 2021-11-05: 2 mg via INTRAVENOUS

## 2021-11-05 MED ORDER — SEVOFLURANE IN SOLN
RESPIRATORY_TRACT | Status: AC
  Filled 2021-11-05: qty 250

## 2021-11-05 MED ORDER — CIPROFLOXACIN IN D5W 400 MG/200ML IV SOLN
400.0000 mg | INTRAVENOUS | Status: AC
  Administered 2021-11-05: 400 mg via INTRAVENOUS

## 2021-11-05 MED ORDER — HYDROMORPHONE HCL 2 MG PO TABS
1.0000 mg | ORAL_TABLET | ORAL | Status: DC | PRN
  Administered 2021-11-05: 1 mg via ORAL
  Administered 2021-11-06 – 2021-11-10 (×10): 2 mg via ORAL
  Filled 2021-11-05 (×11): qty 1

## 2021-11-05 MED ORDER — DULOXETINE HCL 30 MG PO CPEP
30.0000 mg | ORAL_CAPSULE | Freq: Every day | ORAL | Status: DC
  Administered 2021-11-06 – 2021-11-10 (×5): 30 mg via ORAL
  Filled 2021-11-05 (×5): qty 1

## 2021-11-05 MED ORDER — ATORVASTATIN CALCIUM 10 MG PO TABS
10.0000 mg | ORAL_TABLET | Freq: Every day | ORAL | Status: DC
  Administered 2021-11-06 – 2021-11-10 (×5): 10 mg via ORAL
  Filled 2021-11-05 (×5): qty 1

## 2021-11-05 MED ORDER — FENTANYL CITRATE (PF) 100 MCG/2ML IJ SOLN
INTRAMUSCULAR | Status: AC
  Filled 2021-11-05: qty 2

## 2021-11-05 MED ORDER — HYDROMORPHONE HCL 1 MG/ML IJ SOLN
0.5000 mg | INTRAMUSCULAR | Status: DC | PRN
  Administered 2021-11-06 – 2021-11-08 (×3): 1 mg via INTRAVENOUS
  Filled 2021-11-05 (×3): qty 1

## 2021-11-05 MED ORDER — KETAMINE HCL 50 MG/5ML IJ SOSY
PREFILLED_SYRINGE | INTRAMUSCULAR | Status: AC
  Filled 2021-11-05: qty 5

## 2021-11-05 MED ORDER — 0.9 % SODIUM CHLORIDE (POUR BTL) OPTIME
TOPICAL | Status: DC | PRN
  Administered 2021-11-05: 1000 mL

## 2021-11-05 MED ORDER — HYDROMORPHONE HCL 1 MG/ML IJ SOLN
INTRAMUSCULAR | Status: AC
  Administered 2021-11-05: 0.5 mg via INTRAVENOUS
  Filled 2021-11-05: qty 1

## 2021-11-05 MED ORDER — PROPOFOL 10 MG/ML IV BOLUS
INTRAVENOUS | Status: DC | PRN
  Administered 2021-11-05: 150 mg via INTRAVENOUS

## 2021-11-05 MED ORDER — LINAGLIPTIN 5 MG PO TABS
5.0000 mg | ORAL_TABLET | Freq: Every day | ORAL | Status: DC
  Administered 2021-11-06 – 2021-11-10 (×5): 5 mg via ORAL
  Filled 2021-11-05 (×6): qty 1

## 2021-11-05 MED ORDER — LORATADINE 10 MG PO TABS: 10.0000 mg | ORAL_TABLET | Freq: Every day | ORAL | Status: DC | PRN

## 2021-11-05 MED ORDER — ORAL CARE MOUTH RINSE: 15.0000 mL | Freq: Once | OROMUCOSAL | Status: AC

## 2021-11-05 MED ORDER — CHLORHEXIDINE GLUCONATE 0.12 % MT SOLN
OROMUCOSAL | Status: AC
  Administered 2021-11-05: 15 mL via OROMUCOSAL
  Filled 2021-11-05: qty 15

## 2021-11-05 MED ORDER — PROMETHAZINE HCL 25 MG/ML IJ SOLN: 6.2500 mg | INTRAMUSCULAR | Status: DC | PRN

## 2021-11-05 MED ORDER — INSULIN ASPART 100 UNIT/ML IJ SOLN
0.0000 [IU] | Freq: Three times a day (TID) | INTRAMUSCULAR | Status: DC
  Administered 2021-11-05: 8 [IU] via SUBCUTANEOUS
  Administered 2021-11-06: 2 [IU] via SUBCUTANEOUS
  Administered 2021-11-06: 3 [IU] via SUBCUTANEOUS
  Administered 2021-11-06: 2 [IU] via SUBCUTANEOUS
  Administered 2021-11-07: 3 [IU] via SUBCUTANEOUS
  Administered 2021-11-07 – 2021-11-10 (×6): 2 [IU] via SUBCUTANEOUS
  Filled 2021-11-05 (×10): qty 1

## 2021-11-05 MED ORDER — PROPOFOL 10 MG/ML IV BOLUS
INTRAVENOUS | Status: AC
  Filled 2021-11-05: qty 20

## 2021-11-05 MED ORDER — DOCUSATE SODIUM 100 MG PO CAPS
100.0000 mg | ORAL_CAPSULE | Freq: Every day | ORAL | Status: DC
  Administered 2021-11-06 – 2021-11-10 (×4): 100 mg via ORAL
  Filled 2021-11-05 (×5): qty 1

## 2021-11-05 MED ORDER — MIDAZOLAM HCL 2 MG/2ML IJ SOLN
INTRAMUSCULAR | Status: AC
  Filled 2021-11-05: qty 2

## 2021-11-05 MED ORDER — VITAMIN B-12 1000 MCG PO TABS
500.0000 ug | ORAL_TABLET | Freq: Every day | ORAL | Status: DC
  Administered 2021-11-06 – 2021-11-10 (×5): 500 ug via ORAL
  Filled 2021-11-05 (×5): qty 1

## 2021-11-05 MED ORDER — ACETAMINOPHEN 10 MG/ML IV SOLN
INTRAVENOUS | Status: AC
  Filled 2021-11-05: qty 100

## 2021-11-05 MED ORDER — FOLIC ACID 1 MG PO TABS
1.0000 mg | ORAL_TABLET | Freq: Every day | ORAL | Status: DC
  Administered 2021-11-06 – 2021-11-10 (×5): 1 mg via ORAL
  Filled 2021-11-05 (×5): qty 1

## 2021-11-05 MED ORDER — FAMOTIDINE 20 MG PO TABS: 20.0000 mg | ORAL_TABLET | Freq: Every day | ORAL | Status: DC

## 2021-11-05 MED ORDER — PHENYLEPHRINE 80 MCG/ML (10ML) SYRINGE FOR IV PUSH (FOR BLOOD PRESSURE SUPPORT): PREFILLED_SYRINGE | INTRAVENOUS | Status: DC | PRN

## 2021-11-05 MED ORDER — ACETAMINOPHEN 325 MG PO TABS
325.0000 mg | ORAL_TABLET | Freq: Four times a day (QID) | ORAL | Status: DC | PRN
  Administered 2021-11-06: 600 mg via ORAL
  Administered 2021-11-07 – 2021-11-09 (×5): 650 mg via ORAL
  Filled 2021-11-05 (×6): qty 2

## 2021-11-05 MED ORDER — GABAPENTIN 300 MG PO CAPS
600.0000 mg | ORAL_CAPSULE | Freq: Every day | ORAL | Status: DC
  Administered 2021-11-06 – 2021-11-10 (×5): 600 mg via ORAL
  Filled 2021-11-05 (×5): qty 2

## 2021-11-05 MED ORDER — PHENYLEPHRINE HCL (PRESSORS) 10 MG/ML IV SOLN
INTRAVENOUS | Status: DC | PRN
  Administered 2021-11-05: 160 ug via INTRAVENOUS
  Administered 2021-11-05: 80 ug via INTRAVENOUS
  Administered 2021-11-05: 160 ug via INTRAVENOUS
  Administered 2021-11-05: 80 ug via INTRAVENOUS

## 2021-11-05 MED ORDER — ADULT MULTIVITAMIN W/MINERALS CH
1.0000 | ORAL_TABLET | Freq: Every day | ORAL | Status: DC
  Administered 2021-11-06 – 2021-11-10 (×5): 1 via ORAL
  Filled 2021-11-05 (×5): qty 1

## 2021-11-05 MED ORDER — KETAMINE HCL 10 MG/ML IJ SOLN
INTRAMUSCULAR | Status: DC | PRN
  Administered 2021-11-05: 20 mg via INTRAVENOUS
  Administered 2021-11-05: 30 mg via INTRAVENOUS

## 2021-11-05 MED ORDER — ACETAMINOPHEN 10 MG/ML IV SOLN
INTRAVENOUS | Status: DC | PRN
  Administered 2021-11-05: 1000 mg via INTRAVENOUS

## 2021-11-05 MED ORDER — GABAPENTIN 300 MG PO CAPS
900.0000 mg | ORAL_CAPSULE | Freq: Every day | ORAL | Status: DC
  Administered 2021-11-05 – 2021-11-09 (×5): 900 mg via ORAL
  Filled 2021-11-05 (×5): qty 3

## 2021-11-05 MED ORDER — APIXABAN 5 MG PO TABS
5.0000 mg | ORAL_TABLET | Freq: Two times a day (BID) | ORAL | Status: DC
  Administered 2021-11-06 – 2021-11-10 (×9): 5 mg via ORAL
  Filled 2021-11-05 (×9): qty 1

## 2021-11-05 MED ORDER — ONDANSETRON HCL 4 MG/2ML IJ SOLN: 4.0000 mg | Freq: Four times a day (QID) | INTRAMUSCULAR | Status: DC | PRN

## 2021-11-05 MED ORDER — GABAPENTIN 300 MG PO CAPS
300.0000 mg | ORAL_CAPSULE | Freq: Two times a day (BID) | ORAL | Status: DC
Start: 2021-11-05 — End: 2021-11-05

## 2021-11-05 MED ORDER — HYDRALAZINE HCL 20 MG/ML IJ SOLN
5.0000 mg | INTRAMUSCULAR | Status: DC | PRN
  Administered 2021-11-08: 5 mg via INTRAVENOUS
  Filled 2021-11-05: qty 1

## 2021-11-05 MED ORDER — BUPROPION HCL ER (SR) 150 MG PO TB12
150.0000 mg | ORAL_TABLET | Freq: Two times a day (BID) | ORAL | Status: DC
  Administered 2021-11-05 – 2021-11-10 (×10): 150 mg via ORAL
  Filled 2021-11-05 (×10): qty 1

## 2021-11-05 MED ORDER — POLYETHYLENE GLYCOL 3350 17 G PO PACK
17.0000 g | PACK | Freq: Every day | ORAL | Status: DC | PRN
  Administered 2021-11-06: 17 g via ORAL
  Filled 2021-11-05: qty 1

## 2021-11-05 MED ORDER — ACETAMINOPHEN 325 MG PO TABS
650.0000 mg | ORAL_TABLET | Freq: Four times a day (QID) | ORAL | Status: DC | PRN
Start: 2021-11-05 — End: 2021-11-05

## 2021-11-05 MED ORDER — ONDANSETRON HCL 4 MG/2ML IJ SOLN
4.0000 mg | Freq: Four times a day (QID) | INTRAMUSCULAR | Status: DC | PRN
Start: 2021-11-05 — End: 2021-11-10
  Administered 2021-11-09 (×2): 4 mg via INTRAVENOUS
  Filled 2021-11-05 (×2): qty 2

## 2021-11-05 MED ORDER — FAMOTIDINE IN NACL 20-0.9 MG/50ML-% IV SOLN
20.0000 mg | Freq: Two times a day (BID) | INTRAVENOUS | Status: DC
  Administered 2021-11-05 – 2021-11-10 (×10): 20 mg via INTRAVENOUS
  Filled 2021-11-05 (×10): qty 50

## 2021-11-05 MED ORDER — SORBITOL 70 % SOLN
30.0000 mL | Freq: Every day | Status: DC | PRN
  Filled 2021-11-05: qty 30

## 2021-11-05 MED ORDER — TRAMADOL HCL 50 MG PO TABS
50.0000 mg | ORAL_TABLET | Freq: Four times a day (QID) | ORAL | Status: DC
  Administered 2021-11-05 – 2021-11-10 (×19): 50 mg via ORAL
  Filled 2021-11-05 (×19): qty 1

## 2021-11-05 MED ORDER — PHENYLEPHRINE HCL-NACL 20-0.9 MG/250ML-% IV SOLN
INTRAVENOUS | Status: DC | PRN
  Administered 2021-11-05: 40 ug/min via INTRAVENOUS

## 2021-11-05 MED ORDER — MAGNESIUM SULFATE 2 GM/50ML IV SOLN: 2.0000 g | Freq: Every day | INTRAVENOUS | Status: DC | PRN

## 2021-11-05 MED ORDER — HYDROMORPHONE HCL 1 MG/ML IJ SOLN
INTRAMUSCULAR | Status: AC
  Filled 2021-11-05: qty 1

## 2021-11-05 MED ORDER — SODIUM CHLORIDE 0.9 % IV SOLN: INTRAVENOUS | Status: DC

## 2021-11-05 MED ORDER — LISINOPRIL 10 MG PO TABS
10.0000 mg | ORAL_TABLET | ORAL | Status: DC
  Administered 2021-11-06 – 2021-11-10 (×5): 10 mg via ORAL
  Filled 2021-11-05 (×5): qty 1

## 2021-11-05 MED ORDER — PHENOL 1.4 % MT LIQD: 1.0000 | OROMUCOSAL | Status: DC | PRN

## 2021-11-05 MED ORDER — FERROUS SULFATE 325 (65 FE) MG PO TABS
325.0000 mg | ORAL_TABLET | Freq: Every day | ORAL | Status: DC
  Administered 2021-11-06 – 2021-11-10 (×5): 325 mg via ORAL
  Filled 2021-11-05 (×5): qty 1

## 2021-11-05 MED ORDER — DOCUSATE SODIUM 100 MG PO CAPS: 100.0000 mg | ORAL_CAPSULE | Freq: Every day | ORAL | Status: DC

## 2021-11-05 MED ORDER — PANTOPRAZOLE SODIUM 40 MG PO TBEC
40.0000 mg | DELAYED_RELEASE_TABLET | ORAL | Status: DC
  Administered 2021-11-06 – 2021-11-10 (×5): 40 mg via ORAL
  Filled 2021-11-05 (×5): qty 1

## 2021-11-05 MED ORDER — LABETALOL HCL 5 MG/ML IV SOLN: 10.0000 mg | INTRAVENOUS | Status: DC | PRN

## 2021-11-05 MED ORDER — DEXAMETHASONE SODIUM PHOSPHATE 10 MG/ML IJ SOLN
INTRAMUSCULAR | Status: DC | PRN
  Administered 2021-11-05: 10 mg via INTRAVENOUS

## 2021-11-05 MED ORDER — ONDANSETRON HCL 4 MG/2ML IJ SOLN
INTRAMUSCULAR | Status: DC | PRN
  Administered 2021-11-05: 4 mg via INTRAVENOUS

## 2021-11-05 MED ORDER — ASPIRIN 81 MG PO CHEW
81.0000 mg | CHEWABLE_TABLET | Freq: Every day | ORAL | Status: DC
  Administered 2021-11-06 – 2021-11-10 (×5): 81 mg via ORAL
  Filled 2021-11-05 (×5): qty 1

## 2021-11-05 MED ORDER — HYDROMORPHONE HCL 1 MG/ML IJ SOLN
0.2500 mg | INTRAMUSCULAR | Status: DC | PRN
  Administered 2021-11-05: 0.5 mg via INTRAVENOUS

## 2021-11-05 MED ORDER — DROPERIDOL 2.5 MG/ML IJ SOLN: 0.6250 mg | Freq: Once | INTRAMUSCULAR | Status: DC | PRN

## 2021-11-05 MED ORDER — ALBUTEROL SULFATE (2.5 MG/3ML) 0.083% IN NEBU: 3.0000 mL | INHALATION_SOLUTION | Freq: Four times a day (QID) | RESPIRATORY_TRACT | Status: DC | PRN

## 2021-11-05 MED ORDER — ALUM & MAG HYDROXIDE-SIMETH 200-200-20 MG/5ML PO SUSP
15.0000 mL | ORAL | Status: DC | PRN
  Administered 2021-11-09: 15 mL via ORAL
  Filled 2021-11-05: qty 30

## 2021-11-05 MED ORDER — CIPROFLOXACIN IN D5W 400 MG/200ML IV SOLN
INTRAVENOUS | Status: AC
  Filled 2021-11-05: qty 200

## 2021-11-05 MED ORDER — HYDROMORPHONE HCL 1 MG/ML IJ SOLN
INTRAMUSCULAR | Status: DC | PRN
  Administered 2021-11-05: 1 mg via INTRAVENOUS

## 2021-11-05 MED ORDER — HYDROXYCHLOROQUINE SULFATE 200 MG PO TABS
200.0000 mg | ORAL_TABLET | Freq: Every day | ORAL | Status: DC
  Administered 2021-11-06 – 2021-11-10 (×5): 200 mg via ORAL
  Filled 2021-11-05 (×5): qty 1

## 2021-11-05 MED ORDER — CHLORHEXIDINE GLUCONATE 0.12 % MT SOLN: 15.0000 mL | Freq: Once | OROMUCOSAL | Status: AC

## 2021-11-05 MED ORDER — CHLORHEXIDINE GLUCONATE CLOTH 2 % EX PADS
6.0000 | MEDICATED_PAD | Freq: Once | CUTANEOUS | Status: AC
  Administered 2021-11-05: 6 via TOPICAL

## 2021-11-05 MED ORDER — ONDANSETRON 4 MG PO TBDP
4.0000 mg | ORAL_TABLET | Freq: Three times a day (TID) | ORAL | Status: DC | PRN
Start: 2021-11-05 — End: 2021-11-10
  Filled 2021-11-05: qty 1

## 2021-11-05 MED ORDER — METOPROLOL TARTRATE 5 MG/5ML IV SOLN: 2.0000 mg | INTRAVENOUS | Status: DC | PRN

## 2021-11-05 MED ORDER — FENTANYL CITRATE (PF) 100 MCG/2ML IJ SOLN
INTRAMUSCULAR | Status: DC | PRN
  Administered 2021-11-05 (×2): 50 ug via INTRAVENOUS

## 2021-11-05 MED ORDER — BUPIVACAINE HCL (PF) 0.5 % IJ SOLN
INTRAMUSCULAR | Status: DC | PRN
  Administered 2021-11-05 (×2): 10 mL

## 2021-11-05 MED ORDER — POTASSIUM CHLORIDE CRYS ER 20 MEQ PO TBCR: 20.0000 meq | EXTENDED_RELEASE_TABLET | Freq: Every day | ORAL | Status: DC | PRN

## 2021-11-05 MED ORDER — GUAIFENESIN-DM 100-10 MG/5ML PO SYRP: 15.0000 mL | ORAL_SOLUTION | ORAL | Status: DC | PRN

## 2021-11-05 MED ORDER — ACETAMINOPHEN 10 MG/ML IV SOLN: 1000.0000 mg | Freq: Once | INTRAVENOUS | Status: DC | PRN

## 2021-11-05 MED ORDER — LIDOCAINE HCL (CARDIAC) PF 100 MG/5ML IV SOSY
PREFILLED_SYRINGE | INTRAVENOUS | Status: DC | PRN
  Administered 2021-11-05: 50 mg via INTRAVENOUS

## 2021-11-05 MED ORDER — EMPAGLIFLOZIN 10 MG PO TABS
10.0000 mg | ORAL_TABLET | ORAL | Status: DC
  Administered 2021-11-06 – 2021-11-10 (×5): 10 mg via ORAL
  Filled 2021-11-05 (×5): qty 1

## 2021-11-05 MED ORDER — BUPIVACAINE HCL (PF) 0.5 % IJ SOLN
INTRAMUSCULAR | Status: AC
  Filled 2021-11-05: qty 20

## 2021-11-05 MED ORDER — CHLORHEXIDINE GLUCONATE CLOTH 2 % EX PADS: 6.0000 | MEDICATED_PAD | Freq: Once | CUTANEOUS | Status: DC

## 2021-11-05 SURGICAL SUPPLY — 39 items
BLADE SAGITTAL WIDE XTHICK NO (BLADE) IMPLANT
BLADE SAW SAG 25.4X90 (BLADE) ×2 IMPLANT
BNDG COHESIVE 4X5 TAN ST LF (GAUZE/BANDAGES/DRESSINGS) ×2 IMPLANT
BNDG ELASTIC 6X5.8 VLCR NS LF (GAUZE/BANDAGES/DRESSINGS) ×2 IMPLANT
BNDG ELASTIC 6X5.8 VLCR STR LF (GAUZE/BANDAGES/DRESSINGS) ×1 IMPLANT
BNDG GAUZE DERMACEA FLUFF (GAUZE/BANDAGES/DRESSINGS) ×2
BNDG GAUZE DERMACEA FLUFF 4 (GAUZE/BANDAGES/DRESSINGS) ×2 IMPLANT
BRUSH SCRUB EZ  4% CHG (MISCELLANEOUS) ×2
BRUSH SCRUB EZ 4% CHG (MISCELLANEOUS) ×1 IMPLANT
CHLORAPREP W/TINT 26 (MISCELLANEOUS) ×2 IMPLANT
DRAPE INCISE IOBAN 66X45 STRL (DRAPES) IMPLANT
ELECT CAUTERY BLADE 6.4 (BLADE) ×2 IMPLANT
ELECT REM PT RETURN 9FT ADLT (ELECTROSURGICAL) ×2
ELECTRODE REM PT RTRN 9FT ADLT (ELECTROSURGICAL) ×1 IMPLANT
GAUZE XEROFORM 1X8 LF (GAUZE/BANDAGES/DRESSINGS) ×3 IMPLANT
GLOVE BIO SURGEON STRL SZ7 (GLOVE) ×2 IMPLANT
GOWN STRL REUS W/ TWL LRG LVL3 (GOWN DISPOSABLE) ×1 IMPLANT
GOWN STRL REUS W/ TWL XL LVL3 (GOWN DISPOSABLE) ×1 IMPLANT
GOWN STRL REUS W/TWL LRG LVL3 (GOWN DISPOSABLE) ×2
GOWN STRL REUS W/TWL XL LVL3 (GOWN DISPOSABLE) ×2
HANDLE YANKAUER SUCT BULB TIP (MISCELLANEOUS) ×2 IMPLANT
KIT TURNOVER KIT A (KITS) ×2 IMPLANT
LABEL OR SOLS (LABEL) ×2 IMPLANT
MANIFOLD NEPTUNE II (INSTRUMENTS) ×2 IMPLANT
NS IRRIG 1000ML POUR BTL (IV SOLUTION) ×2 IMPLANT
PACK EXTREMITY ARMC (MISCELLANEOUS) ×2 IMPLANT
PAD ABD DERMACEA PRESS 5X9 (GAUZE/BANDAGES/DRESSINGS) ×3 IMPLANT
PAD PREP 24X41 OB/GYN DISP (PERSONAL CARE ITEMS) ×2 IMPLANT
SPONGE T-LAP 18X18 ~~LOC~~+RFID (SPONGE) ×2 IMPLANT
STAPLER SKIN PROX 35W (STAPLE) ×2 IMPLANT
STOCKINETTE M/LG 89821 (MISCELLANEOUS) ×2 IMPLANT
SUT SILK 2 0 (SUTURE) ×2
SUT SILK 2 0 SH (SUTURE) ×4 IMPLANT
SUT SILK 2-0 18XBRD TIE 12 (SUTURE) ×1 IMPLANT
SUT SILK 3 0 (SUTURE) ×2
SUT SILK 3-0 18XBRD TIE 12 (SUTURE) ×1 IMPLANT
SUT VIC AB 0 CT1 36 (SUTURE) ×4 IMPLANT
SUT VIC AB 2-0 CT1 (SUTURE) ×4 IMPLANT
WATER STERILE IRR 500ML POUR (IV SOLUTION) ×2 IMPLANT

## 2021-11-05 NOTE — Progress Notes (Signed)
Inpatient Diabetes Program Recommendations  AACE/ADA: New Consensus Statement on Inpatient Glycemic Control (2015)  Target Ranges:  Prepandial:   less than 140 mg/dL      Peak postprandial:   less than 180 mg/dL (1-2 hours)      Critically ill patients:  140 - 180 mg/dL   Lab Results  Component Value Date   GLUCAP 160 (H) 11/05/2021   HGBA1C 8.2 (H) 09/22/2021    Review of Glycemic Control  Latest Reference Range & Units 11/05/21 13:12  Glucose-Capillary 70 - 99 mg/dL 675 (H)   Diabetes history: DM  Outpatient Diabetes medications:  Jardiance 10 mg q AM Januvia 100 mg q AM Current orders for Inpatient glycemic control:  Novolog 0-15 units tid with meals  Jardiance 10 mg daily Tradjenta 5 mg daily  Inpatient Diabetes Program Recommendations:    Referral received.  Agree with current orders. Will follow.   Thanks,  Beryl Meager, RN, BC-ADM Inpatient Diabetes Coordinator Pager [phone]  (8a-5p)

## 2021-11-05 NOTE — Interval H&P Note (Signed)
History and Physical Interval Note:  11/05/2021 10:33 AM  Sylvia [name]  has presented today for surgery, with the diagnosis of PAD with rest pain.  The various methods of treatment have been discussed with the patient and family. After consideration of risks, benefits and other options for treatment, the patient has consented to  Procedure(s): AMPUTATION BELOW KNEE (Left) as a surgical intervention.  The patient's history has been reviewed, patient examined, no change in status, stable for surgery.  I have reviewed the patient's chart and labs.  Questions were answered to the patient's satisfaction.     [name]

## 2021-11-05 NOTE — Anesthesia Procedure Notes (Signed)
Procedure Name: LMA Insertion Date/Time: 11/05/2021 11:53 AM  Performed by: Cheral Bay, CRNAPre-anesthesia Checklist: Patient identified, Emergency Drugs available, Suction available and Patient being monitored Patient Re-evaluated:Patient Re-evaluated prior to induction Oxygen Delivery Method: Circle system utilized Preoxygenation: Pre-oxygenation with 100% oxygen Induction Type: IV induction Ventilation: Mask ventilation without difficulty LMA: LMA inserted LMA Size: 4.0 Tube type: Oral Number of attempts: 1 Placement Confirmation: positive ETCO2 and breath sounds checked- equal and bilateral Tube secured with: Tape Dental Injury: Teeth and Oropharynx as per pre-operative assessment

## 2021-11-05 NOTE — Anesthesia Procedure Notes (Signed)
Anesthesia Regional Block: Adductor canal block   Pre-Anesthetic Checklist: , timeout performed,  Correct Patient, Correct Site, Correct Laterality,  Correct Procedure, Correct Position, site marked,  Risks and benefits discussed,  Surgical consent,  Pre-op evaluation,  At surgeon's request and post-op pain management  Laterality: Left  Prep: chloraprep       Needles:  Injection technique: Single-shot  Needle Type: Stimiplex     Needle Length: 9cm  Needle Gauge: 22     Additional Needles:   Procedures:,,,, ultrasound used (permanent image in chart),,    Narrative:  Start time: 11/05/2021 2:12 PM End time: 11/05/2021 2:15 PM Injection made incrementally with aspirations every 5 mL.  Performed by: Personally  Anesthesiologist: Foye Deer, MD  Additional Notes: Patient consented for risk and benefits of nerve block including but not limited to nerve damage, failed block, bleeding and infection.  Patient voiced understanding.  Functioning IV was confirmed and monitors were applied.  Timeout done prior to procedure and prior to any sedation being given to the patient.  Patient confirmed procedure site prior to any sedation given to the patient. Sterile prep,hand hygiene and sterile gloves were used.  Minimal sedation used for procedure.  No paresthesia endorsed by patient during the procedure.  Negative aspiration and negative test dose prior to incremental administration of local anesthetic. The patient tolerated the procedure well with no immediate complications.

## 2021-11-05 NOTE — H&P (View-Only) (Signed)
Subjective:    Patient ID: Sylvia [name], female    DOB: 03/14/1956, 66 y.o.   MRN: [id number] No chief complaint on file.   The patient presented today for vein mapping of her left lower extremity with the intention of a possible bypass.  Over the last 6 weeks the patient has underwent multiple attempts at revascularization of her left lower extremity however despite these attempts these revascularizations have repeatedly failed.  The patient's most recent angiogram was done in order to allow for healing of a possible below-knee amputation however it was noted that the patient may have available access for a femoropopliteal bypass if she had a vein available.  However, if the patient were to move forward with the bypass she would have to undergo an above-knee amputation if it were to fail.  At this time currently we will leave the patient will be able to have a below-knee amputation.  Following this discussion the patient elected not to undergo vein mapping and decided to move forward with her below-knee amputation.  She has a previous right below-knee amputation.    Review of Systems  Musculoskeletal:  Positive for gait problem.  Psychiatric/Behavioral:  The patient is nervous/anxious.   All other systems reviewed and are negative.      Objective:   Physical Exam Vitals reviewed.  HENT:     Head: Normocephalic.  Cardiovascular:     Rate and Rhythm: Normal rate.  Pulmonary:     Effort: Pulmonary effort is normal.  Skin:    General: Skin is warm and dry.  Neurological:     Mental Status: She is alert and oriented to person, place, and time.     Motor: Weakness present.     Gait: Gait abnormal.  Psychiatric:        Mood and Affect: Mood normal.        Behavior: Behavior normal.        Thought Content: Thought content normal.        Judgment: Judgment normal.     There were no vitals taken for this visit.  Past Medical History:  Diagnosis Date   Anemia of chronic  disease 05/16/2019   Anxiety    h/o   Arthritis    Cocaine abuse (HCC)    COPD (chronic obstructive pulmonary disease) (HCC)    Depression    Diabetes mellitus without complication (HCC)    DKA (diabetic ketoacidosis) (HCC)    Elevated troponin    GERD (gastroesophageal reflux disease)    GI bleed    Hypertension    bp under control-off meds since 2019   Ischemic leg     Social History   Socioeconomic History   Marital status: Divorced    Spouse name: Not on file   Number of children: 2   Years of education: Not on file   Highest education level: Not on file  Occupational History   Not on file  Tobacco Use   Smoking status: Former    Packs/day: 0.25    Years: 45.00    Total pack years: 11.25    Types: Cigarettes   Smokeless tobacco: Never   Tobacco comments:    quit  Vaping Use   Vaping Use: Never used  Substance and Sexual Activity   Alcohol use: No   Drug use: Not Currently    Types: Cocaine    Comment: last used in April 2021 per patient-last 2 drug tests have been negative for cocaine   Sexual  activity: Not Currently  Other Topics Concern   Not on file  Social History Narrative   5 grandchildren and 5 great grandchildren    2 grandchildren live with Pt. (25 & 55 y.o.)   Social Determinants of Health   Financial Resource Strain: Low Risk  (06/14/2018)   Overall Financial Resource Strain (CARDIA)    Difficulty of Paying Living Expenses: Not hard at all  Food Insecurity: Food Insecurity Present (06/14/2018)   Hunger Vital Sign    Worried About Running Out of Food in the Last Year: Sometimes true    Ran Out of Food in the Last Year: Sometimes true  Transportation Needs: No Transportation Needs (06/14/2018)   PRAPARE - Administrator, Civil Service (Medical): No    Lack of Transportation (Non-Medical): No  Physical Activity: Insufficiently Active (06/14/2018)   Exercise Vital Sign    Days of Exercise per Week: 4 days    Minutes of Exercise per  Session: 20 min  Stress: No Stress Concern Present (06/14/2018)   [employer] of Occupational Health - Occupational Stress Questionnaire    Feeling of Stress : Only a little  Social Connections: Unknown (06/14/2018)   Social Connection and Isolation Panel [NHANES]    Frequency of Communication with Friends and Family: Patient refused    Frequency of Social Gatherings with Friends and Family: Patient refused    Attends Religious Services: Patient refused    Active Member of Clubs or Organizations: Patient refused    Attends Banker Meetings: Patient refused    Marital Status: Patient refused  Intimate Partner Violence: Unknown (06/14/2018)   Humiliation, Afraid, Rape, and Kick questionnaire    Fear of Current or Ex-Partner: Patient refused    Emotionally Abused: Patient refused    Physically Abused: Patient refused    Sexually Abused: Patient refused    Past Surgical History:  Procedure Laterality Date   ABDOMINAL HYSTERECTOMY     AMPUTATION Right 12/27/2018   Procedure: AMPUTATION BELOW KNEE;  Surgeon: Annice Needy, MD;  Location: [facility];  Service: General;  Laterality: Right;   APPLICATION OF WOUND VAC  10/16/2021   Procedure: APPLICATION OF WOUND VAC;  Surgeon: Annice Needy, MD;  Location: [facility];  Service: Vascular;;   CATARACT EXTRACTION W/PHACO Right 03/27/2020   Procedure: CATARACT EXTRACTION PHACO AND INTRAOCULAR LENS PLACEMENT (IOC) RIGHT DIABETIC 7.54 00:52.5;  Surgeon: Galen Manila, MD;  Location: [facility];  Service: Ophthalmology;  Laterality: Right;   CATARACT EXTRACTION W/PHACO Left 05/20/2020   Procedure: CATARACT EXTRACTION PHACO AND INTRAOCULAR LENS PLACEMENT (IOC) LEFT DIABETIC 4.95 00:37.6;  Surgeon: Galen Manila, MD;  Location: [facility];  Service: Ophthalmology;  Laterality: Left;  Diabetic - oral meds COVID + 04-24-20   CHOLECYSTECTOMY     COLONOSCOPY WITH PROPOFOL N/A 04/16/2019   Procedure: COLONOSCOPY WITH PROPOFOL;   Surgeon: Pasty Spillers, MD;  Location: [facility];  Service: Endoscopy;  Laterality: N/A;   COLONOSCOPY WITH PROPOFOL N/A 01/16/2020   Procedure: COLONOSCOPY WITH PROPOFOL;  Surgeon: Pasty Spillers, MD;  Location: [facility];  Service: Endoscopy;  Laterality: N/A;   ENDARTERECTOMY FEMORAL Left 10/16/2021   Procedure: ENDARTERECTOMY FEMORAL;  Surgeon: Annice Needy, MD;  Location: [facility];  Service: Vascular;  Laterality: Left;   ESOPHAGOGASTRODUODENOSCOPY (EGD) WITH PROPOFOL N/A 06/15/2018   Procedure: ESOPHAGOGASTRODUODENOSCOPY (EGD) WITH PROPOFOL;  Surgeon: Toledo, Boykin Nearing, MD;  Location: [facility];  Service: Gastroenterology;  Laterality: N/A;   ESOPHAGOGASTRODUODENOSCOPY (EGD) WITH PROPOFOL  N/A 01/03/2019   Procedure: ESOPHAGOGASTRODUODENOSCOPY (EGD) WITH PROPOFOL;  Surgeon: Lucilla Lame, MD;  Location: [facility];  Service: Endoscopy;  Laterality: N/A;   ESOPHAGOGASTRODUODENOSCOPY (EGD) WITH PROPOFOL N/A 09/25/2021   Procedure: ESOPHAGOGASTRODUODENOSCOPY (EGD) WITH PROPOFOL;  Surgeon: Lucilla Lame, MD;  Location: [facility];  Service: Endoscopy;  Laterality: N/A;   GIVENS CAPSULE STUDY  04/16/2019   Procedure: GIVENS CAPSULE STUDY;  Surgeon: Virgel Manifold, MD;  Location: [facility];  Service: Endoscopy;;   GIVENS CAPSULE STUDY N/A 06/25/2019   Procedure: GIVENS CAPSULE STUDY;  Surgeon: Jonathon Bellows, MD;  Location: [facility];  Service: Gastroenterology;  Laterality: N/A;   LOWER EXTREMITY ANGIOGRAPHY Right 08/21/2018   Procedure: LOWER EXTREMITY ANGIOGRAPHY;  Surgeon: Algernon Huxley, MD;  Location: [place] [facility];  Service: Cardiovascular;  Laterality: Right;   LOWER EXTREMITY ANGIOGRAPHY Left 08/28/2018   Procedure: LOWER EXTREMITY ANGIOGRAPHY;  Surgeon: Algernon Huxley, MD;  Location: [place] [facility];  Service: Cardiovascular;  Laterality: Left;   LOWER EXTREMITY ANGIOGRAPHY Right 08/28/2018   Procedure: Lower Extremity Angiography;  Surgeon:  Algernon Huxley, MD;  Location: [place] [facility];  Service: Cardiovascular;  Laterality: Right;   LOWER EXTREMITY ANGIOGRAPHY Right 12/18/2018   Procedure: Lower Extremity Angiography;  Surgeon: Algernon Huxley, MD;  Location: [place] [facility];  Service: Cardiovascular;  Laterality: Right;   LOWER EXTREMITY ANGIOGRAPHY Right 12/18/2018   Procedure: Lower Extremity Angiography;  Surgeon: Algernon Huxley, MD;  Location: [place] [facility];  Service: Cardiovascular;  Laterality: Right;   LOWER EXTREMITY ANGIOGRAPHY Left 12/21/2018   Procedure: Lower Extremity Angiography;  Surgeon: Algernon Huxley, MD;  Location: [place] [facility];  Service: Cardiovascular;  Laterality: Left;   LOWER EXTREMITY ANGIOGRAPHY Right 12/21/2018   Procedure: Lower Extremity Angiography;  Surgeon: Algernon Huxley, MD;  Location: [place] [facility];  Service: Cardiovascular;  Laterality: Right;   LOWER EXTREMITY ANGIOGRAPHY Left 12/25/2020   Procedure: LOWER EXTREMITY ANGIOGRAPHY;  Surgeon: Algernon Huxley, MD;  Location: [place] [facility];  Service: Cardiovascular;  Laterality: Left;   LOWER EXTREMITY ANGIOGRAPHY Left 09/21/2021   Procedure: Lower Extremity Angiography;  Surgeon: Algernon Huxley, MD;  Location: [place] [facility];  Service: Cardiovascular;  Laterality: Left;   LOWER EXTREMITY ANGIOGRAPHY Left 10/13/2021   Procedure: Lower Extremity Angiography;  Surgeon: Katha Cabal, MD;  Location: [place] [facility];  Service: Cardiovascular;  Laterality: Left;   LOWER EXTREMITY ANGIOGRAPHY Left 10/14/2021   Procedure: Lower Extremity Angiography;  Surgeon: Algernon Huxley, MD;  Location: [place] [facility];  Service: Cardiovascular;  Laterality: Left;   LOWER EXTREMITY ANGIOGRAPHY Left 11/02/2021   Procedure: Lower Extremity Angiography;  Surgeon: Algernon Huxley, MD;  Location: [place] [facility];  Service: Cardiovascular;  Laterality: Left;   LOWER EXTREMITY INTERVENTION N/A 12/22/2018   Procedure: LOWER EXTREMITY INTERVENTION;   Surgeon: Algernon Huxley, MD;  Location: [place] [facility];  Service: Cardiovascular;  Laterality: N/A;   LOWER EXTREMITY INTERVENTION Left 12/26/2020   Procedure: LOWER EXTREMITY INTERVENTION;  Surgeon: Algernon Huxley, MD;  Location: [place] [facility];  Service: Cardiovascular;  Laterality: Left;   LOWER EXTREMITY INTERVENTION N/A 09/22/2021   Procedure: LOWER EXTREMITY INTERVENTION;  Surgeon: Algernon Huxley, MD;  Location: [place] [facility];  Service: Cardiovascular;  Laterality: N/A;    Family History  Problem Relation Age of Onset   Breast cancer Mother 24    Allergies  Allergen Reactions  Vancomycin Rash    Patient developed a rash to injection site and arm a few mintes after starting ABX.    Hydrocodone Rash   Metformin And Related Diarrhea   Penicillins Hives    Has patient had a PCN reaction causing immediate rash, facial/tongue/throat swelling, SOB or lightheadedness with hypotension: Yes Has patient had a PCN reaction causing severe rash involving mucus membranes or skin necrosis: No Has patient had a PCN reaction that required hospitalization: No Has patient had a PCN reaction occurring within the last 10 years: No If all of the above answers are "NO", then may proceed with Cephalosporin use.   Tramadol Itching       Latest Ref Rng & Units 11/03/2021    7:24 PM 10/20/2021    5:00 AM 10/19/2021   12:51 AM  CBC  WBC 4.0 - 10.5 K/uL 14.5  9.5  10.0   Hemoglobin 12.0 - 15.0 g/dL 9.2  8.2  8.1   Hematocrit 36.0 - 46.0 % 28.2  24.8  24.9   Platelets 150 - 400 K/uL 425  298  267       CMP     Component Value Date/Time   NA 140 11/03/2021 1924   K 3.3 (L) 11/03/2021 1924   CL 109 11/03/2021 1924   CO2 17 (L) 11/03/2021 1924   GLUCOSE 251 (H) 11/03/2021 1924   BUN 10 11/03/2021 1924   CREATININE 0.86 11/03/2021 1924   CALCIUM 9.9 11/03/2021 1924   PROT 8.3 (H) 11/03/2021 1924   ALBUMIN 3.9 11/03/2021 1924   AST 18 11/03/2021 1924   ALT 12 11/03/2021 1924   ALKPHOS  116 11/03/2021 1924   BILITOT 0.9 11/03/2021 1924   GFRNONAA >60 11/03/2021 1924   GFRAA >60 11/23/2019 1505     VAS [place] ABI WITH/WO TBI  Result Date: 09/18/2021  LOWER EXTREMITY DOPPLER STUDY Patient Name:  Joycelyn Annear  Date of Exam:   09/17/2021 Medical Rec #: [id number]           Accession #:    [id number] Date of Birth: 02/07/1956            Patient Gender: F Patient Age:   81 years Exam Location:  [facility] Procedure:      VAS [place] ABI WITH/WO TBI Referring Phys: Leotis Pain --------------------------------------------------------------------------------  Indications: Peripheral artery disease, and right BKA.  Vascular Interventions: 08/21/18: Right SFA/popliteal artery PTA/stent;                         08/28/18: Left EIA & SFA/popliteal artery stents;                         08/28/18: Right popliteal, TP trunk & peroneal artery                         thrombectomies/PTAs;                         12/27/18: Right BKA;                         12/25/20: Left SFA/popliteal thrombectomy/PTA;                         12/26/20: Left proximal SFA thombectomy/stent;. Comparison Study: 07/21/2021 Performing Technologist: Almira Coaster RVS  Examination Guidelines:  A complete evaluation includes at minimum, Doppler waveform signals and systolic blood pressure reading at the level of bilateral brachial, anterior tibial, and posterior tibial arteries, when vessel segments are accessible. Bilateral testing is considered an integral part of a complete examination. Photoelectric Plethysmograph (PPG) waveforms and toe systolic pressure readings are included as required and additional duplex testing as needed. Limited examinations for reoccurring indications may be performed as noted.  ABI Findings: +--------+------------------+-----+--------+--------+ Right   Rt Pressure (mmHg)IndexWaveformComment  +--------+------------------+-----+--------+--------+ [id number]                                      +--------+------------------+-----+--------+--------+ ATA                                    Rt BKA   +--------+------------------+-----+--------+--------+ +---------+------------------+-----+----------+-------+ Left     Lt Pressure (mmHg)IndexWaveform  Comment +---------+------------------+-----+----------+-------+ Brachial 154                                      +---------+------------------+-----+----------+-------+ ATA      73                0.47 monophasic        +---------+------------------+-----+----------+-------+ PTA      67                0.44 monophasic        +---------+------------------+-----+----------+-------+ Great Toe39                0.25 Abnormal          +---------+------------------+-----+----------+-------+ +-------+-----------+-----------+------------+------------+ ABI/TBIToday's ABIToday's TBIPrevious ABIPrevious TBI +-------+-----------+-----------+------------+------------+ Right  Rt BKA                RT BKA                   +-------+-----------+-----------+------------+------------+ Left   .47        .25        .93         .96          +-------+-----------+-----------+------------+------------+ Left ABIs and TBIs appear decreased compared to prior study on 07/21/2021.  Summary: Right: Rt BKA. Left: Resting left ankle-brachial index indicates severe left lower extremity arterial disease. The left toe-brachial index is abnormal.  *See table(s) above for measurements and observations.  Electronically signed by [name] MD on 09/18/2021 at 11:42:58 AM.    Final        Assessment & Plan:   1. Ischemic leg Following discussion with patient about the options of bypass versus amputation, the patient has decided to move forward with a left below-knee amputation.  We discussed the risk, benefits and alternatives and the patient is agreeable to proceed.   Current Outpatient Medications on File Prior to Visit  Medication Sig Dispense  Refill   acetaminophen (TYLENOL) 325 MG tablet Take 650 mg by mouth every 6 (six) hours as needed.     albuterol (PROVENTIL HFA;VENTOLIN HFA) 108 (90 Base) MCG/ACT inhaler Inhale 2 puffs into the lungs every 6 (six) hours as needed for wheezing or shortness of breath. 1 Inhaler 2   apixaban (ELIQUIS) 5 MG TABS tablet Take 1 tablet (5 mg total) by mouth 2 (two) times daily. 60 tablet 12   aspirin  81 MG chewable tablet Chew 1 tablet (81 mg total) by mouth daily. 30 tablet 11   atorvastatin (LIPITOR) 10 MG tablet Take 10 mg by mouth at bedtime.     buPROPion (ZYBAN) 150 MG 12 hr tablet Take 150 mg by mouth 2 (two) times daily.     cetirizine (ZYRTEC) 10 MG tablet Take 10 mg by mouth daily as needed for allergies.     docusate sodium (COLACE) 100 MG capsule Take 100 mg by mouth daily.     DULoxetine (CYMBALTA) 30 MG capsule Take 30 mg by mouth as needed.     famotidine (PEPCID) 20 MG tablet Take 1 tablet (20 mg total) by mouth at bedtime. 30 tablet 3   ferrous sulfate 325 (65 FE) MG tablet Take 325 mg by mouth daily.      folic acid (FOLVITE) 1 MG tablet Take 1 mg by mouth daily.     gabapentin (NEURONTIN) 300 MG capsule TAKE 2 CAPSULES BY MOUTH IN THE  MORNING AND 3 CAPSULES BY MOUTH  IN THE EVENING 450 capsule 3   hydroxychloroquine (PLAQUENIL) 200 MG tablet Take 200 mg by mouth daily.     JARDIANCE 10 MG TABS tablet Take 10 mg by mouth every morning.     lisinopril (ZESTRIL) 10 MG tablet Take 1 tablet (10 mg total) by mouth daily. (Patient taking differently: Take 10 mg by mouth every morning.) 30 tablet 1   Multiple Vitamins-Minerals (WOMENS MULTIVITAMIN PO) Take 1 tablet by mouth daily.     oxyCODONE (OXY IR/ROXICODONE) 5 MG immediate release tablet Take 1 tablet (5 mg total) by mouth every 6 (six) hours as needed for severe pain. 40 tablet 0   pantoprazole (PROTONIX) 40 MG tablet Take 1 tablet (40 mg total) by mouth daily. (Patient taking differently: Take 40 mg by mouth every morning.) 30  tablet 11   sitaGLIPtin (JANUVIA) 100 MG tablet Take 100 mg by mouth every morning.     vitamin B-12 (CYANOCOBALAMIN) 500 MCG tablet Take 1 tablet (500 mcg total) by mouth daily. 90 tablet 1   No current facility-administered medications on file prior to visit.    There are no Patient Instructions on file for this visit. No follow-ups on file.   Kris Hartmann, NP

## 2021-11-05 NOTE — Progress Notes (Signed)
Subjective:    Patient ID: Sylvia [name], female    DOB: 03/14/1956, 66 y.o.   MRN: [id number] No chief complaint on file.   The patient presented today for vein mapping of her left lower extremity with the intention of a possible bypass.  Over the last 6 weeks the patient has underwent multiple attempts at revascularization of her left lower extremity however despite these attempts these revascularizations have repeatedly failed.  The patient's most recent angiogram was done in order to allow for healing of a possible below-knee amputation however it was noted that the patient may have available access for a femoropopliteal bypass if she had a vein available.  However, if the patient were to move forward with the bypass she would have to undergo an above-knee amputation if it were to fail.  At this time currently we will leave the patient will be able to have a below-knee amputation.  Following this discussion the patient elected not to undergo vein mapping and decided to move forward with her below-knee amputation.  She has a previous right below-knee amputation.    Review of Systems  Musculoskeletal:  Positive for gait problem.  Psychiatric/Behavioral:  The patient is nervous/anxious.   All other systems reviewed and are negative.      Objective:   Physical Exam Vitals reviewed.  HENT:     Head: Normocephalic.  Cardiovascular:     Rate and Rhythm: Normal rate.  Pulmonary:     Effort: Pulmonary effort is normal.  Skin:    General: Skin is warm and dry.  Neurological:     Mental Status: She is alert and oriented to person, place, and time.     Motor: Weakness present.     Gait: Gait abnormal.  Psychiatric:        Mood and Affect: Mood normal.        Behavior: Behavior normal.        Thought Content: Thought content normal.        Judgment: Judgment normal.     There were no vitals taken for this visit.  Past Medical History:  Diagnosis Date   Anemia of chronic  disease 05/16/2019   Anxiety    h/o   Arthritis    Cocaine abuse (HCC)    COPD (chronic obstructive pulmonary disease) (HCC)    Depression    Diabetes mellitus without complication (HCC)    DKA (diabetic ketoacidosis) (HCC)    Elevated troponin    GERD (gastroesophageal reflux disease)    GI bleed    Hypertension    bp under control-off meds since 2019   Ischemic leg     Social History   Socioeconomic History   Marital status: Divorced    Spouse name: Not on file   Number of children: 2   Years of education: Not on file   Highest education level: Not on file  Occupational History   Not on file  Tobacco Use   Smoking status: Former    Packs/day: 0.25    Years: 45.00    Total pack years: 11.25    Types: Cigarettes   Smokeless tobacco: Never   Tobacco comments:    quit  Vaping Use   Vaping Use: Never used  Substance and Sexual Activity   Alcohol use: No   Drug use: Not Currently    Types: Cocaine    Comment: last used in April 2021 per patient-last 2 drug tests have been negative for cocaine   Sexual  activity: Not Currently  Other Topics Concern   Not on file  Social History Narrative   5 grandchildren and 5 great grandchildren    2 grandchildren live with Pt. (25 & 55 y.o.)   Social Determinants of Health   Financial Resource Strain: Low Risk  (06/14/2018)   Overall Financial Resource Strain (CARDIA)    Difficulty of Paying Living Expenses: Not hard at all  Food Insecurity: Food Insecurity Present (06/14/2018)   Hunger Vital Sign    Worried About Running Out of Food in the Last Year: Sometimes true    Ran Out of Food in the Last Year: Sometimes true  Transportation Needs: No Transportation Needs (06/14/2018)   PRAPARE - Administrator, Civil Service (Medical): No    Lack of Transportation (Non-Medical): No  Physical Activity: Insufficiently Active (06/14/2018)   Exercise Vital Sign    Days of Exercise per Week: 4 days    Minutes of Exercise per  Session: 20 min  Stress: No Stress Concern Present (06/14/2018)   [employer] of Occupational Health - Occupational Stress Questionnaire    Feeling of Stress : Only a little  Social Connections: Unknown (06/14/2018)   Social Connection and Isolation Panel [NHANES]    Frequency of Communication with Friends and Family: Patient refused    Frequency of Social Gatherings with Friends and Family: Patient refused    Attends Religious Services: Patient refused    Active Member of Clubs or Organizations: Patient refused    Attends Banker Meetings: Patient refused    Marital Status: Patient refused  Intimate Partner Violence: Unknown (06/14/2018)   Humiliation, Afraid, Rape, and Kick questionnaire    Fear of Current or Ex-Partner: Patient refused    Emotionally Abused: Patient refused    Physically Abused: Patient refused    Sexually Abused: Patient refused    Past Surgical History:  Procedure Laterality Date   ABDOMINAL HYSTERECTOMY     AMPUTATION Right 12/27/2018   Procedure: AMPUTATION BELOW KNEE;  Surgeon: Annice Needy, MD;  Location: [facility];  Service: General;  Laterality: Right;   APPLICATION OF WOUND VAC  10/16/2021   Procedure: APPLICATION OF WOUND VAC;  Surgeon: Annice Needy, MD;  Location: [facility];  Service: Vascular;;   CATARACT EXTRACTION W/PHACO Right 03/27/2020   Procedure: CATARACT EXTRACTION PHACO AND INTRAOCULAR LENS PLACEMENT (IOC) RIGHT DIABETIC 7.54 00:52.5;  Surgeon: Galen Manila, MD;  Location: [facility];  Service: Ophthalmology;  Laterality: Right;   CATARACT EXTRACTION W/PHACO Left 05/20/2020   Procedure: CATARACT EXTRACTION PHACO AND INTRAOCULAR LENS PLACEMENT (IOC) LEFT DIABETIC 4.95 00:37.6;  Surgeon: Galen Manila, MD;  Location: [facility];  Service: Ophthalmology;  Laterality: Left;  Diabetic - oral meds COVID + 04-24-20   CHOLECYSTECTOMY     COLONOSCOPY WITH PROPOFOL N/A 04/16/2019   Procedure: COLONOSCOPY WITH PROPOFOL;   Surgeon: Pasty Spillers, MD;  Location: [facility];  Service: Endoscopy;  Laterality: N/A;   COLONOSCOPY WITH PROPOFOL N/A 01/16/2020   Procedure: COLONOSCOPY WITH PROPOFOL;  Surgeon: Pasty Spillers, MD;  Location: [facility];  Service: Endoscopy;  Laterality: N/A;   ENDARTERECTOMY FEMORAL Left 10/16/2021   Procedure: ENDARTERECTOMY FEMORAL;  Surgeon: Annice Needy, MD;  Location: [facility];  Service: Vascular;  Laterality: Left;   ESOPHAGOGASTRODUODENOSCOPY (EGD) WITH PROPOFOL N/A 06/15/2018   Procedure: ESOPHAGOGASTRODUODENOSCOPY (EGD) WITH PROPOFOL;  Surgeon: Toledo, Boykin Nearing, MD;  Location: [facility];  Service: Gastroenterology;  Laterality: N/A;   ESOPHAGOGASTRODUODENOSCOPY (EGD) WITH PROPOFOL  N/A 01/03/2019   Procedure: ESOPHAGOGASTRODUODENOSCOPY (EGD) WITH PROPOFOL;  Surgeon: Lucilla Lame, MD;  Location: [facility];  Service: Endoscopy;  Laterality: N/A;   ESOPHAGOGASTRODUODENOSCOPY (EGD) WITH PROPOFOL N/A 09/25/2021   Procedure: ESOPHAGOGASTRODUODENOSCOPY (EGD) WITH PROPOFOL;  Surgeon: Lucilla Lame, MD;  Location: [facility];  Service: Endoscopy;  Laterality: N/A;   GIVENS CAPSULE STUDY  04/16/2019   Procedure: GIVENS CAPSULE STUDY;  Surgeon: Virgel Manifold, MD;  Location: [facility];  Service: Endoscopy;;   GIVENS CAPSULE STUDY N/A 06/25/2019   Procedure: GIVENS CAPSULE STUDY;  Surgeon: Jonathon Bellows, MD;  Location: [facility];  Service: Gastroenterology;  Laterality: N/A;   LOWER EXTREMITY ANGIOGRAPHY Right 08/21/2018   Procedure: LOWER EXTREMITY ANGIOGRAPHY;  Surgeon: Algernon Huxley, MD;  Location: [place] [facility];  Service: Cardiovascular;  Laterality: Right;   LOWER EXTREMITY ANGIOGRAPHY Left 08/28/2018   Procedure: LOWER EXTREMITY ANGIOGRAPHY;  Surgeon: Algernon Huxley, MD;  Location: [place] [facility];  Service: Cardiovascular;  Laterality: Left;   LOWER EXTREMITY ANGIOGRAPHY Right 08/28/2018   Procedure: Lower Extremity Angiography;  Surgeon:  Algernon Huxley, MD;  Location: [place] [facility];  Service: Cardiovascular;  Laterality: Right;   LOWER EXTREMITY ANGIOGRAPHY Right 12/18/2018   Procedure: Lower Extremity Angiography;  Surgeon: Algernon Huxley, MD;  Location: [place] [facility];  Service: Cardiovascular;  Laterality: Right;   LOWER EXTREMITY ANGIOGRAPHY Right 12/18/2018   Procedure: Lower Extremity Angiography;  Surgeon: Algernon Huxley, MD;  Location: [place] [facility];  Service: Cardiovascular;  Laterality: Right;   LOWER EXTREMITY ANGIOGRAPHY Left 12/21/2018   Procedure: Lower Extremity Angiography;  Surgeon: Algernon Huxley, MD;  Location: [place] [facility];  Service: Cardiovascular;  Laterality: Left;   LOWER EXTREMITY ANGIOGRAPHY Right 12/21/2018   Procedure: Lower Extremity Angiography;  Surgeon: Algernon Huxley, MD;  Location: [place] [facility];  Service: Cardiovascular;  Laterality: Right;   LOWER EXTREMITY ANGIOGRAPHY Left 12/25/2020   Procedure: LOWER EXTREMITY ANGIOGRAPHY;  Surgeon: Algernon Huxley, MD;  Location: [place] [facility];  Service: Cardiovascular;  Laterality: Left;   LOWER EXTREMITY ANGIOGRAPHY Left 09/21/2021   Procedure: Lower Extremity Angiography;  Surgeon: Algernon Huxley, MD;  Location: [place] [facility];  Service: Cardiovascular;  Laterality: Left;   LOWER EXTREMITY ANGIOGRAPHY Left 10/13/2021   Procedure: Lower Extremity Angiography;  Surgeon: Katha Cabal, MD;  Location: [place] [facility];  Service: Cardiovascular;  Laterality: Left;   LOWER EXTREMITY ANGIOGRAPHY Left 10/14/2021   Procedure: Lower Extremity Angiography;  Surgeon: Algernon Huxley, MD;  Location: [place] [facility];  Service: Cardiovascular;  Laterality: Left;   LOWER EXTREMITY ANGIOGRAPHY Left 11/02/2021   Procedure: Lower Extremity Angiography;  Surgeon: Algernon Huxley, MD;  Location: [place] [facility];  Service: Cardiovascular;  Laterality: Left;   LOWER EXTREMITY INTERVENTION N/A 12/22/2018   Procedure: LOWER EXTREMITY INTERVENTION;   Surgeon: Algernon Huxley, MD;  Location: [place] [facility];  Service: Cardiovascular;  Laterality: N/A;   LOWER EXTREMITY INTERVENTION Left 12/26/2020   Procedure: LOWER EXTREMITY INTERVENTION;  Surgeon: Algernon Huxley, MD;  Location: [place] [facility];  Service: Cardiovascular;  Laterality: Left;   LOWER EXTREMITY INTERVENTION N/A 09/22/2021   Procedure: LOWER EXTREMITY INTERVENTION;  Surgeon: Algernon Huxley, MD;  Location: [place] [facility];  Service: Cardiovascular;  Laterality: N/A;    Family History  Problem Relation Age of Onset   Breast cancer Mother 24    Allergies  Allergen Reactions  Vancomycin Rash    Patient developed a rash to injection site and arm a few mintes after starting ABX.    Hydrocodone Rash   Metformin And Related Diarrhea   Penicillins Hives    Has patient had a PCN reaction causing immediate rash, facial/tongue/throat swelling, SOB or lightheadedness with hypotension: Yes Has patient had a PCN reaction causing severe rash involving mucus membranes or skin necrosis: No Has patient had a PCN reaction that required hospitalization: No Has patient had a PCN reaction occurring within the last 10 years: No If all of the above answers are "NO", then may proceed with Cephalosporin use.   Tramadol Itching       Latest Ref Rng & Units 11/03/2021    7:24 PM 10/20/2021    5:00 AM 10/19/2021   12:51 AM  CBC  WBC 4.0 - 10.5 K/uL 14.5  9.5  10.0   Hemoglobin 12.0 - 15.0 g/dL 9.2  8.2  8.1   Hematocrit 36.0 - 46.0 % 28.2  24.8  24.9   Platelets 150 - 400 K/uL 425  298  267       CMP     Component Value Date/Time   NA 140 11/03/2021 1924   K 3.3 (L) 11/03/2021 1924   CL 109 11/03/2021 1924   CO2 17 (L) 11/03/2021 1924   GLUCOSE 251 (H) 11/03/2021 1924   BUN 10 11/03/2021 1924   CREATININE 0.86 11/03/2021 1924   CALCIUM 9.9 11/03/2021 1924   PROT 8.3 (H) 11/03/2021 1924   ALBUMIN 3.9 11/03/2021 1924   AST 18 11/03/2021 1924   ALT 12 11/03/2021 1924   ALKPHOS  116 11/03/2021 1924   BILITOT 0.9 11/03/2021 1924   GFRNONAA >60 11/03/2021 1924   GFRAA >60 11/23/2019 1505     VAS [place] ABI WITH/WO TBI  Result Date: 09/18/2021  LOWER EXTREMITY DOPPLER STUDY Patient Name:  Sylvia [name]  Date of Exam:   09/17/2021 Medical Rec #: [id number]           Accession #:    [id number] Date of Birth: 02/07/1956            Patient Gender: F Patient Age:   81 years Exam Location:  [facility] Procedure:      VAS [place] ABI WITH/WO TBI Referring Phys: Leotis Pain --------------------------------------------------------------------------------  Indications: Peripheral artery disease, and right BKA.  Vascular Interventions: 08/21/18: Right SFA/popliteal artery PTA/stent;                         08/28/18: Left EIA & SFA/popliteal artery stents;                         08/28/18: Right popliteal, TP trunk & peroneal artery                         thrombectomies/PTAs;                         12/27/18: Right BKA;                         12/25/20: Left SFA/popliteal thrombectomy/PTA;                         12/26/20: Left proximal SFA thombectomy/stent;. Comparison Study: 07/21/2021 Performing Technologist: Almira Coaster RVS  Examination Guidelines:  A complete evaluation includes at minimum, Doppler waveform signals and systolic blood pressure reading at the level of bilateral brachial, anterior tibial, and posterior tibial arteries, when vessel segments are accessible. Bilateral testing is considered an integral part of a complete examination. Photoelectric Plethysmograph (PPG) waveforms and toe systolic pressure readings are included as required and additional duplex testing as needed. Limited examinations for reoccurring indications may be performed as noted.  ABI Findings: +--------+------------------+-----+--------+--------+ Right   Rt Pressure (mmHg)IndexWaveformComment  +--------+------------------+-----+--------+--------+ Brachial152                                      +--------+------------------+-----+--------+--------+ ATA                                    Rt BKA   +--------+------------------+-----+--------+--------+ +---------+------------------+-----+----------+-------+ Left     Lt Pressure (mmHg)IndexWaveform  Comment +---------+------------------+-----+----------+-------+ Brachial 154                                      +---------+------------------+-----+----------+-------+ ATA      73                0.47 monophasic        +---------+------------------+-----+----------+-------+ PTA      67                0.44 monophasic        +---------+------------------+-----+----------+-------+ Great Toe39                0.25 Abnormal          +---------+------------------+-----+----------+-------+ +-------+-----------+-----------+------------+------------+ ABI/TBIToday's ABIToday's TBIPrevious ABIPrevious TBI +-------+-----------+-----------+------------+------------+ Right  Rt BKA                RT BKA                   +-------+-----------+-----------+------------+------------+ Left   .47        .25        .93         .96          +-------+-----------+-----------+------------+------------+ Left ABIs and TBIs appear decreased compared to prior study on 07/21/2021.  Summary: Right: Rt BKA. Left: Resting left ankle-brachial index indicates severe left lower extremity arterial disease. The left toe-brachial index is abnormal.  *See table(s) above for measurements and observations.  Electronically signed by Jason Dew MD on 09/18/2021 at 11:42:58 AM.    Final        Assessment & Plan:   1. Ischemic leg Following discussion with patient about the options of bypass versus amputation, the patient has decided to move forward with a left below-knee amputation.  We discussed the risk, benefits and alternatives and the patient is agreeable to proceed.   Current Outpatient Medications on File Prior to Visit  Medication Sig Dispense  Refill   acetaminophen (TYLENOL) 325 MG tablet Take 650 mg by mouth every 6 (six) hours as needed.     albuterol (PROVENTIL HFA;VENTOLIN HFA) 108 (90 Base) MCG/ACT inhaler Inhale 2 puffs into the lungs every 6 (six) hours as needed for wheezing or shortness of breath. 1 Inhaler 2   apixaban (ELIQUIS) 5 MG TABS tablet Take 1 tablet (5 mg total) by mouth 2 (two) times daily. 60 tablet 12   aspirin   81 MG chewable tablet Chew 1 tablet (81 mg total) by mouth daily. 30 tablet 11   atorvastatin (LIPITOR) 10 MG tablet Take 10 mg by mouth at bedtime.     buPROPion (ZYBAN) 150 MG 12 hr tablet Take 150 mg by mouth 2 (two) times daily.     cetirizine (ZYRTEC) 10 MG tablet Take 10 mg by mouth daily as needed for allergies.     docusate sodium (COLACE) 100 MG capsule Take 100 mg by mouth daily.     DULoxetine (CYMBALTA) 30 MG capsule Take 30 mg by mouth as needed.     famotidine (PEPCID) 20 MG tablet Take 1 tablet (20 mg total) by mouth at bedtime. 30 tablet 3   ferrous sulfate 325 (65 FE) MG tablet Take 325 mg by mouth daily.      folic acid (FOLVITE) 1 MG tablet Take 1 mg by mouth daily.     gabapentin (NEURONTIN) 300 MG capsule TAKE 2 CAPSULES BY MOUTH IN THE  MORNING AND 3 CAPSULES BY MOUTH  IN THE EVENING 450 capsule 3   hydroxychloroquine (PLAQUENIL) 200 MG tablet Take 200 mg by mouth daily.     JARDIANCE 10 MG TABS tablet Take 10 mg by mouth every morning.     lisinopril (ZESTRIL) 10 MG tablet Take 1 tablet (10 mg total) by mouth daily. (Patient taking differently: Take 10 mg by mouth every morning.) 30 tablet 1   Multiple Vitamins-Minerals (WOMENS MULTIVITAMIN PO) Take 1 tablet by mouth daily.     oxyCODONE (OXY IR/ROXICODONE) 5 MG immediate release tablet Take 1 tablet (5 mg total) by mouth every 6 (six) hours as needed for severe pain. 40 tablet 0   pantoprazole (PROTONIX) 40 MG tablet Take 1 tablet (40 mg total) by mouth daily. (Patient taking differently: Take 40 mg by mouth every morning.) 30  tablet 11   sitaGLIPtin (JANUVIA) 100 MG tablet Take 100 mg by mouth every morning.     vitamin B-12 (CYANOCOBALAMIN) 500 MCG tablet Take 1 tablet (500 mcg total) by mouth daily. 90 tablet 1   No current facility-administered medications on file prior to visit.    There are no Patient Instructions on file for this visit. No follow-ups on file.   Kris Hartmann, NP

## 2021-11-05 NOTE — Anesthesia Procedure Notes (Signed)
Anesthesia Regional Block: Popliteal block   Pre-Anesthetic Checklist: , timeout performed,  Correct Patient, Correct Site, Correct Laterality,  Correct Procedure, Correct Position, site marked,  Risks and benefits discussed,  Surgical consent,  Pre-op evaluation,  At surgeon's request and post-op pain management  Laterality: Left  Prep: chloraprep       Needles:  Injection technique: Single-shot  Needle Type: Stimiplex     Needle Length: 9cm  Needle Gauge: 22     Additional Needles:   Procedures:,,,, ultrasound used (permanent image in chart),,    Narrative:  Start time: 11/05/2021 2:10 PM End time: 11/05/2021 2:08 PM Injection made incrementally with aspirations every 5 mL.  Performed by: Personally  Anesthesiologist: Foye Deer, MD  Additional Notes: Patient consented for risk and benefits of nerve block including but not limited to nerve damage, failed block, bleeding and infection.  Patient voiced understanding.  Functioning IV was confirmed and monitors were applied.  Timeout done prior to procedure and prior to any sedation being given to the patient.  Patient confirmed procedure site prior to any sedation given to the patient. Sterile prep,hand hygiene and sterile gloves were used.  Minimal sedation used for procedure.  No paresthesia endorsed by patient during the procedure.  Negative aspiration and negative test dose prior to incremental administration of local anesthetic. The patient tolerated the procedure well with no immediate complications.

## 2021-11-05 NOTE — Transfer of Care (Signed)
Immediate Anesthesia Transfer of Care Note  Patient: Demira Gwynne  Procedure(s) Performed: AMPUTATION BELOW KNEE (Left: Knee)  Patient Location: PACU  Anesthesia Type:General  Level of Consciousness: drowsy  Airway & Oxygen Therapy: Patient Spontanous Breathing  Post-op Assessment: Report given to RN and Post -op Vital signs reviewed and stable  Post vital signs: Reviewed and stable  Last Vitals:  Vitals Value Taken Time  BP 149/91 11/05/21 1310  Temp    Pulse 111 11/05/21 1312  Resp 15 11/05/21 1312  SpO2 95 % 11/05/21 1312  Vitals shown include unvalidated device data.  Last Pain:  Vitals:   11/05/21 1038  TempSrc: Oral  PainSc: 0-No pain         Complications: No notable events documented.

## 2021-11-05 NOTE — Op Note (Signed)
   OPERATIVE NOTE   PROCEDURE: Left below-the-knee amputation  PRE-OPERATIVE DIAGNOSIS: Left foot rest pain  POST-OPERATIVE DIAGNOSIS: same as above  SURGEON: [name], MD  ASSISTANT(S): none  ANESTHESIA: general  ESTIMATED BLOOD LOSS: 100 cc  FINDING(S): none  SPECIMEN(S):  Left below-the-knee amputation  INDICATIONS:   Sylvia [name] is a 66 y.o. female who presents with left side PAD with rest pain.  The patient is scheduled for a left below-the-knee amputation.  I discussed in depth with the patient the risks, benefits, and alternatives to this procedure.  The patient is aware that the risk of this operation included but are not limited to:  bleeding, infection, myocardial infarction, stroke, death, failure to heal amputation wound, and possible need for more proximal amputation.  The patient is aware of the risks and agrees proceed forward with the procedure.  DESCRIPTION:  After full informed written consent was obtained from the patient, the patient was brought back to the operating room, and placed supine upon the operating table.  Prior to induction, the patient received IV antibiotics.  The patient was then prepped and draped in the standard fashion for a below-the-knee amputation.  After obtaining adequate anesthesia, the patient was prepped and draped in the standard fashion for a left below-the-knee amputation.  I marked out the anterior incision two finger breadths below the tibial tuberosity and then the marked out a posterior flap that was one third of the circumference of the calf in length.   I made the incisions for these flaps, and then dissected through the subcutaneous tissue, fascia, and muscle anteriorly.  I elevated  the periosteal tissue superiorly so that the tibia was about 3-4 cm shorter than the anterior skin flap.  I then transected the tibia with a power saw and then took a wedge off the tibia anteriorly with the power saw.  Then I smoothed out the  rough edges.  In a similar fashion, I cut back the fibula about two centimeters higher than the level of the tibia with a bone cutter.  I put a bone hook into the distal tibia and then used a large amputation knife to sharply develop a tissue plane through the muscle along the fibula.  In such fashion, the posterior flap was developed.  At this point, the specimen was passed off the field as the below-the-knee amputation.  At this point, I clamped all visibly bleeding arteries and veins using a combination of suture ligation with Silk suture and electrocautery.  Bleeding continued to be controlled with electrocautery and suture ligature.  The stump was washed off with sterile normal saline and no further active bleeding was noted.  I reapproximated the anterior and posterior fascia  with interrupted stitches of 0 Vicryl.  This was completed along the entire length of anterior and posterior fascia until there were no more loose space in the fascial line. I then placed a layer of 2-0 Vicryl sutures in the subcutaneous tissue. The skin was then  reapproximated with staples.  The stump was washed off and dried.  The incision was dressed with Xeroform and  then fluffs were applied.  Kerlix was wrapped around the leg and then gently an ACE wrap was applied.    COMPLICATIONS: none  CONDITION: stable   [name]  11/05/2021, 1:19 PM    This note was created with Dragon Medical transcription system. Any errors in dictation are purely unintentional.

## 2021-11-05 NOTE — Anesthesia Preprocedure Evaluation (Addendum)
Anesthesia Evaluation   Patient awake    Reviewed: Allergy & Precautions, NPO status , Patient's Chart, lab work & pertinent test results  History of Anesthesia Complications Negative for: history of anesthetic complications  Airway Mallampati: III   Neck ROM: Full    Dental  (+) Edentulous Lower, Edentulous Upper   Pulmonary COPD,  COPD inhaler, Current Smoker and Patient abstained from smoking., former smoker,  COVID+ 04/24/20   Pulmonary exam normal breath sounds clear to auscultation       Cardiovascular Exercise Tolerance: Poor + Peripheral Vascular Disease (s/p right BKA on Eliquis)  Normal cardiovascular exam Rhythm:Regular Rate:Normal  bp under control-off meds since 2019   Neuro/Psych PSYCHIATRIC DISORDERS Anxiety Chronic pain related to both and prior RLE BKA    GI/Hepatic GERD  Controlled and Medicated,H/o DU   Endo/Other  diabetes, Poorly Controlled, Type 2last A1c 8.7% Sept 2022  Renal/GU negative Renal ROS     Musculoskeletal  (+) Arthritis , Rheumatoid disorders,  RA on MTX/plaquenil, pred chronically as well as TNF blocker    Abdominal Normal abdominal exam  (+)   Peds  Hematology  (+) Blood dyscrasia, anemia ,   Anesthesia Other Findings 09/22/11 angiogram w/ angioplasty, TPA administration 6/6 back to OR> angioplasty/PCI popliteal artery 6/27- overnight thrombolytic therapy 6/28- Left lower extremity angiogram 2. Mechanical thrombectomy of the left SFA, popliteal artery, and anterior tibial artery all the way down to the ankle with the penumbra CAT 6 device 3. Percutaneous transluminal angioplasty of left dorsalis pedis artery and distal anterior tibial artery with 2 mm diameter by 22 cm length angioplasty balloon   Reproductive/Obstetrics                           Anesthesia Physical  Anesthesia Plan  ASA: 3  Anesthesia Plan: General    Post-op Pain Management: Ofirmev IV (intra-op)*, Ketamine IV*, Dilaudid IV and Regional block*   Induction: Intravenous  PONV Risk Score and Plan: 1 and Ondansetron and Dexamethasone  Airway Management Planned: LMA  Additional Equipment:   Intra-op Plan:   Post-operative Plan: Extubation in OR  Informed Consent: I have reviewed the patients History and Physical, chart, labs and discussed the procedure including the risks, benefits and alternatives for the proposed anesthesia with the patient or authorized representative who has indicated his/her understanding and acceptance.     Dental advisory given  Plan Discussed with: CRNA  Anesthesia Plan Comments:        Anesthesia Quick Evaluation

## 2021-11-06 ENCOUNTER — Encounter: Payer: Self-pay | Admitting: Vascular Surgery

## 2021-11-06 DIAGNOSIS — Z9889 Other specified postprocedural states: Secondary | ICD-10-CM

## 2021-11-06 DIAGNOSIS — Z89512 Acquired absence of left leg below knee: Secondary | ICD-10-CM

## 2021-11-06 LAB — CBC
HCT: 25.4 % — ABNORMAL LOW (ref 36.0–46.0)
Hemoglobin: 8.3 g/dL — ABNORMAL LOW (ref 12.0–15.0)
MCH: 27.8 pg (ref 26.0–34.0)
MCHC: 32.7 g/dL (ref 30.0–36.0)
MCV: 84.9 fL (ref 80.0–100.0)
Platelets: 396 10*3/uL (ref 150–400)
RBC: 2.99 MIL/uL — ABNORMAL LOW (ref 3.87–5.11)
RDW: 14.2 % (ref 11.5–15.5)
WBC: 12.9 10*3/uL — ABNORMAL HIGH (ref 4.0–10.5)
nRBC: 0 % (ref 0.0–0.2)

## 2021-11-06 LAB — GLUCOSE, CAPILLARY
Glucose-Capillary: 166 mg/dL — ABNORMAL HIGH (ref 70–99)
Glucose-Capillary: 124 mg/dL — ABNORMAL HIGH (ref 70–99)
Glucose-Capillary: 121 mg/dL — ABNORMAL HIGH (ref 70–99)
Glucose-Capillary: 140 mg/dL — ABNORMAL HIGH (ref 70–99)

## 2021-11-06 LAB — BASIC METABOLIC PANEL
Anion gap: 7 (ref 5–15)
BUN: 13 mg/dL (ref 8–23)
CO2: 24 mmol/L (ref 22–32)
Calcium: 9.5 mg/dL (ref 8.9–10.3)
Chloride: 111 mmol/L (ref 98–111)
Creatinine, Ser: 1.02 mg/dL — ABNORMAL HIGH (ref 0.44–1.00)
GFR, Estimated: >60 mL/min (ref >60)
Glucose, Bld: 147 mg/dL — ABNORMAL HIGH (ref 70–99)
Potassium: 3.4 mmol/L — ABNORMAL LOW (ref 3.5–5.1)
Sodium: 142 mmol/L (ref 135–145)

## 2021-11-06 MED ORDER — KETOROLAC TROMETHAMINE 30 MG/ML IJ SOLN
30.0000 mg | Freq: Three times a day (TID) | INTRAMUSCULAR | Status: AC
  Administered 2021-11-06 – 2021-11-07 (×5): 30 mg via INTRAVENOUS
  Filled 2021-11-06 (×5): qty 1

## 2021-11-06 NOTE — Evaluation (Signed)
Occupational Therapy Evaluation Patient Details Name: Sylvia [name] MRN: [id number] DOB: [birth year] Today's Date: 11/06/2021   History of Present Illness Pt is a 66 y/o F who failed multiple attemtps at revascularization of LLE. 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   Clinical Impression   Pt was seen for OT evaluation this date. Prior to hospital admission, pt was ambulatory with RW & prosthesis and MOD I/Independent with ADLs. Pt lives with spouse in a one level house with ramped entrance. Pt reports family/friends can provide 24/7 assistance if needed. Pt presents to acute OT demonstrating impaired ADL performance and functional mobility 2/2 decreased activity tolerance and functional strength/ROM/balance deficits. Pt currently requires SUPERVISION/SETUP with min vcs sequencing for lateral scoot t/f to BCS with drop arm, donning/doffing underpants with lateral leans, and pericare while seated. Pt would benefit from skilled OT to address noted impairments and functional limitations (see below for any additional details). Upon hospital discharge, recommend no OT follow up.     Recommendations for follow up therapy are one component of a multi-disciplinary discharge planning process, led by the attending physician.  Recommendations may be updated based on patient status, additional functional criteria and insurance authorization.   Follow Up Recommendations  No OT follow up    Assistance Recommended at Discharge Set up Supervision/Assistance  Patient can return home with the following A little help with walking and/or transfers;A little help with bathing/dressing/bathroom;Assistance with cooking/housework;Assist for transportation;Help with stairs or ramp for entrance    Functional Status Assessment  Patient has had a recent decline in their functional status and demonstrates the ability to make significant improvements in function in a reasonable and predictable amount of time.  Equipment  Recommendations  BSC/3in1 (with drop arm)    Recommendations for Other Services       Precautions / Restrictions Precautions Precautions: Fall Precaution Comments: R BKA with prosthesis Restrictions Weight Bearing Restrictions: No RLE Weight Bearing: Weight bearing as tolerated (with prosthesis) LLE Weight Bearing: Non weight bearing      Mobility Bed Mobility               General bed mobility comments: Pt was in recliner chair at start of session and left on BSC at end of session with nursing present    Transfers Overall transfer level: Needs assistance Equipment used: None Transfers: Bed to chair/wheelchair/BSC            Lateral/Scoot Transfers: Supervision        Balance Overall balance assessment: Needs assistance Sitting-balance support: Bilateral upper extremity supported, Feet supported Sitting balance-Leahy Scale: Good                                     ADL either performed or assessed with clinical judgement   ADL Overall ADL's : Needs assistance/impaired                                       General ADL Comments: SUPERVISION/SETUP with min vcs sequencing for lateral scoot t/f to BCS with drop arm, donning/doffing underpants with lateral leans, and pericare while seated.      Pertinent Vitals/Pain Pain Assessment Pain Assessment: Faces Faces Pain Scale: Hurts a little bit Pain Location: L residual limb Pain Descriptors / Indicators: Discomfort, Guarding, Grimacing Pain Intervention(s): Limited activity within patient's tolerance, Monitored during  session     Hand Dominance     Extremity/Trunk Assessment Upper Extremity Assessment Upper Extremity Assessment: Overall WFL for tasks assessed   Lower Extremity Assessment Lower Extremity Assessment: Defer to PT evaluation RLE Deficits / Details: hx of R BKA, pt attempting to extend L knee and tolerates full ROM but is very pain limited; dressing on distal  end of residual limb       Communication Communication Communication: No difficulties   Cognition Arousal/Alertness: Awake/alert Behavior During Therapy: WFL for tasks assessed/performed Overall Cognitive Status: Within Functional Limits for tasks assessed                                                  Home Living Family/patient expects to be discharged to:: Private residence Living Arrangements: Spouse/significant other Available Help at Discharge: Family;Friend(s);Available 24 hours/day Type of Home: House Home Access: Ramped entrance     Home Layout: One level               Home Equipment: BSC/3in1;Shower seat;Cane - single point;Hand held shower head;Rolling Walker (2 wheels);Wheelchair - manual   Additional Comments: Pt reports she needs a w/c as hers got left outside & rusted.      Prior Functioning/Environment Prior Level of Function : Independent/Modified Independent             Mobility Comments: Pt was ambulatory with RW & prosthesis. ADLs Comments: MOD I/Independent with ADLs        OT Problem List: Decreased strength;Decreased range of motion;Decreased activity tolerance      OT Treatment/Interventions: Self-care/ADL training;Therapeutic exercise;Energy conservation;DME and/or AE instruction;Therapeutic activities;Patient/family education;Balance training    OT Goals(Current goals can be found in the care plan section) Acute Rehab OT Goals Patient Stated Goal: to get better OT Goal Formulation: With patient Time For Goal Achievement: 11/20/21 Potential to Achieve Goals: Good ADL Goals Pt Will Perform Grooming: with modified independence;standing (with R LE prosthetic) Pt Will Perform Lower Body Dressing: with modified independence;sitting/lateral leans Pt Will Transfer to Toilet: with modified independence;squat pivot transfer;bedside commode (with LRAD and R LE prosthetic)  OT Frequency: Min 2X/week       AM-PAC OT  "6 Clicks" Daily Activity     Outcome Measure Help from another person eating meals?: None Help from another person taking care of personal grooming?: None Help from another person toileting, which includes using toliet, bedpan, or urinal?: A Little Help from another person bathing (including washing, rinsing, drying)?: A Little Help from another person to put on and taking off regular upper body clothing?: None Help from another person to put on and taking off regular lower body clothing?: A Little 6 Click Score: 21   End of Session Equipment Utilized During Treatment: Other (comment) (none) Nurse Communication: Mobility status  Activity Tolerance: Patient tolerated treatment well Patient left: Other (comment) (Pt left on BSC with nursing present)  OT Visit Diagnosis: Other abnormalities of gait and mobility (R26.89)                Time: 0623-7628 OT Time Calculation (min): 18 min Charges:  OT General Charges $OT Visit: 1 Visit OT Evaluation $OT Eval Moderate Complexity: 1 Mod OT Treatments $Self Care/Home Management : 8-22 mins  [employer], OTDS  [employer] 11/06/2021, 1:13 PM

## 2021-11-06 NOTE — Anesthesia Postprocedure Evaluation (Signed)
Anesthesia Post Note  Patient: Sylvia [name]  Procedure(s) Performed: AMPUTATION BELOW KNEE (Left: Knee)  Patient location during evaluation: PACU Anesthesia Type: General Level of consciousness: awake and alert Pain management: pain level controlled Vital Signs Assessment: post-procedure vital signs reviewed and stable Respiratory status: spontaneous breathing, nonlabored ventilation and respiratory function stable Cardiovascular status: blood pressure returned to baseline and stable Postop Assessment: no apparent nausea or vomiting Anesthetic complications: no   No notable events documented.   Last Vitals:  Vitals:   11/05/21 1921 11/06/21 0012  BP: 134/69 (!) 119/59  Pulse: 91 90  Resp: 15 15  Temp: 36.9 C 36.7 C  SpO2: 100% 96%    Last Pain:  Vitals:   11/06/21 0530  TempSrc:   PainSc: 4                  Foye Deer

## 2021-11-06 NOTE — Plan of Care (Signed)
  Problem: Coping: Goal: Ability to adjust to condition or change in health will improve Outcome: Progressing   Problem: Fluid Volume: Goal: Ability to maintain a balanced intake and output will improve Outcome: Progressing   Problem: Health Behavior/Discharge Planning: Goal: Ability to identify and utilize available resources and services will improve Outcome: Progressing   Problem: Metabolic: Goal: Ability to maintain appropriate glucose levels will improve Outcome: Progressing   Problem: Nutritional: Goal: Maintenance of adequate nutrition will improve Outcome: Progressing   Problem: Skin Integrity: Goal: Risk for impaired skin integrity will decrease Outcome: Progressing   Problem: Tissue Perfusion: Goal: Adequacy of tissue perfusion will improve Outcome: Progressing   Problem: Education: Goal: Knowledge of the prescribed therapeutic regimen will improve Outcome: Progressing   Problem: Education: Goal: Understanding of discharge needs will improve Outcome: Progressing   Problem: Self-Concept: Goal: Ability to maintain and perform role responsibilities to the fullest extent possible will improve Outcome: Progressing   Problem: Pain Management: Goal: Pain level will decrease with appropriate interventions Outcome: Progressing   Problem: Skin Integrity: Goal: Demonstration of wound healing without infection will improve Outcome: Progressing   Problem: Health Behavior/Discharge Planning: Goal: Ability to manage health-related needs will improve Outcome: Progressing   Problem: Nutrition: Goal: Adequate nutrition will be maintained Outcome: Progressing   Problem: Coping: Goal: Level of anxiety will decrease Outcome: Progressing

## 2021-11-06 NOTE — Evaluation (Signed)
Physical Therapy Evaluation Patient Details Name: Sylvia [name] MRN: [id number] DOB: 1955-12-18 Today's Date: 11/06/2021  History of Present Illness  Pt is a 66 y/o F who failed multiple attemtps at revascularization of LLE. 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  Clinical Impression  Pt seen for PT evaluation with pt agreeable to tx. Pt recalls information from old R BKA but PT reiterating need to promote L knee extension & desensitization techniques. Pt is able to complete supine>long sit with mod I. When given choice of pivoting with prosthesis vs A/P transfer pt requests to perform A/P transfer to recliner & does so with supervision. Pt left in recliner with BLE elevated on a pillow. Will continue to follow pt acutely to address strengthening, transfers, w/c mobility, and gait with LRAD.     Recommendations for follow up therapy are one component of a multi-disciplinary discharge planning process, led by the attending physician.  Recommendations may be updated based on patient status, additional functional criteria and insurance authorization.  Follow Up Recommendations Home health PT Can patient physically be transported by private vehicle: No    Assistance Recommended at Discharge Frequent or constant Supervision/Assistance  Patient can return home with the following  A lot of help with walking and/or transfers;A lot of help with bathing/dressing/bathroom;Assistance with cooking/housework;Help with stairs or ramp for entrance;Assist for transportation    Equipment Recommendations Wheelchair (measurements PT) (with L amputee support pad)  Recommendations for Other Services       Functional Status Assessment Patient has had a recent decline in their functional status and demonstrates the ability to make significant improvements in function in a reasonable and predictable amount of time.     Precautions / Restrictions Precautions Precautions: Fall Precaution Comments: R BKA with  prosthesis Restrictions Weight Bearing Restrictions: No RLE Weight Bearing: Weight bearing as tolerated (with prosthesis) LLE Weight Bearing: Non weight bearing      Mobility  Bed Mobility Overal bed mobility: Modified Independent Bed Mobility: Sit to Supine     Supine to sit: Modified independent (Device/Increase time)     General bed mobility comments: pt transfers supine>long sitting with HOB slightly elevated & bed rails PRN    Transfers Overall transfer level: Needs assistance Equipment used: None Transfers: Bed to chair/wheelchair/BSC         Anterior-Posterior transfers: Supervision        Ambulation/Gait                  Stairs            Wheelchair Mobility    Modified Rankin (Stroke Patients Only)       Balance Overall balance assessment: Needs assistance Sitting-balance support: Bilateral upper extremity supported, Feet supported Sitting balance-Leahy Scale: Good                                       Pertinent Vitals/Pain Pain Assessment Pain Assessment: 0-10 Pain Score: 8  Pain Location: L residual limb Pain Descriptors / Indicators: Discomfort, Throbbing, Sharp Pain Intervention(s): Monitored during session, Patient requesting pain meds-RN notified, Repositioned, Limited activity within patient's tolerance    Home Living Family/patient expects to be discharged to:: Private residence Living Arrangements: Spouse/significant other Available Help at Discharge: Family;Friend(s);Available 24 hours/day Type of Home: House Home Access: Ramped entrance       Home Layout: One level Home Equipment: Cane - single point;Hand held shower head;Rolling  Walker (2 wheels) Additional Comments: Pt reports she needs a w/c as hers got left outside & rusted.    Prior Function Prior Level of Function : Independent/Modified Independent             Mobility Comments: Pt was ambulatory with RW & prosthesis.       Hand  Dominance        Extremity/Trunk Assessment   Upper Extremity Assessment Upper Extremity Assessment: Overall WFL for tasks assessed    Lower Extremity Assessment RLE Deficits / Details: hx of R BKA, pt attempting to extend L knee and tolerates full ROM but is very pain limited; dressing on distal end of residual limb       Communication   Communication: No difficulties  Cognition Arousal/Alertness: Awake/alert Behavior During Therapy: WFL for tasks assessed/performed Overall Cognitive Status: Within Functional Limits for tasks assessed                                          General Comments      Exercises     Assessment/Plan    PT Assessment Patient needs continued PT services  PT Problem List Decreased activity tolerance;Decreased balance;Decreased mobility;Decreased strength;Decreased range of motion;Decreased knowledge of use of DME;Decreased safety awareness;Cardiopulmonary status limiting activity       PT Treatment Interventions DME instruction;Gait training;Functional mobility training;Therapeutic activities;Therapeutic exercise;Balance training;Neuromuscular re-education;Patient/family education;Modalities;Manual techniques;Wheelchair mobility training    PT Goals (Current goals can be found in the Care Plan section)  Acute Rehab PT Goals Patient Stated Goal: decreased pain PT Goal Formulation: With patient Time For Goal Achievement: 11/20/21 Potential to Achieve Goals: Good Additional Goals Additional Goal #1: Pt will propel w/c x 150 ft with mod I to increase independence with functional mobility.    Frequency 7X/week     Co-evaluation               AM-PAC PT "6 Clicks" Mobility  Outcome Measure Help needed turning from your back to your side while in a flat bed without using bedrails?: None Help needed moving from lying on your back to sitting on the side of a flat bed without using bedrails?: None Help needed moving to and  from a bed to a chair (including a wheelchair)?: A Little Help needed standing up from a chair using your arms (e.g., wheelchair or bedside chair)?: A Lot Help needed to walk in hospital room?: Total Help needed climbing 3-5 steps with a railing? : Total 6 Click Score: 15    End of Session   Activity Tolerance: Patient limited by pain Patient left: in chair;with call bell/phone within reach Nurse Communication: Mobility status PT Visit Diagnosis: Other abnormalities of gait and mobility (R26.89);Muscle weakness (generalized) (M62.81);Pain Pain - Right/Left: Left Pain - part of body: Leg    Time: 0917-0929 PT Time Calculation (min) (ACUTE ONLY): 12 min   Charges:   PT Evaluation $PT Eval Moderate Complexity: 1 Mod          Aleda Grana, PT, DPT 11/06/21, 11:54 AM   Sandi Mariscal 11/06/2021, 11:48 AM

## 2021-11-06 NOTE — Progress Notes (Signed)
Vitals entered manually ° °

## 2021-11-06 NOTE — TOC Progression Note (Signed)
Transition of Care [facility]) - Progression Note    Patient Details  Name: Sylvia [name] MRN: [id number] Date of Birth: July 09, 1955  [facility] CM/SW Contact  Marlowe Sax, RN Phone Number: 11/06/2021, 10:02 AM  Clinical Narrative:     [facility] accepted the patient for [facility]  Expected Discharge Plan: Home w Home Health Services Barriers to Discharge: Continued Medical Work up, No Home Care Agency will accept this patient  Expected Discharge Plan and Services Expected Discharge Plan: Home w Home Health Services   Discharge Planning Services: CM Consult   Living arrangements for the past 2 months: Single Family Home Expected Discharge Date: 11/08/21                                     Social Determinants of Health (SDOH) Interventions    Readmission Risk Interventions     No data to display

## 2021-11-06 NOTE — Progress Notes (Signed)
[facility]  Daily Progress Note   Subjective  -   In pain. No events overnight  Objective Vitals:   11/05/21 1430 11/05/21 1921 11/06/21 0012 11/06/21 0756  BP: (!) 148/69 134/69 (!) 119/59 (!) 168/62  Pulse: 90 91 90 84  Resp: 11 15 15 15   Temp:  98.5 F (36.9 C) 98 F (36.7 C) 98.1 F (36.7 C)  TempSrc:    Oral  SpO2: 96% 100% 96% 95%  Weight:      Height:        Intake/Output Summary (Last 24 hours) at 11/06/2021 0837 Last data filed at 11/06/2021 0326 Gross per 24 hour  Intake 492.85 ml  Output 100 ml  Net 392.85 ml    PULM  CTAB CV  RRR VASC  Dressing C/D/I  Laboratory CBC    Component Value Date/Time   WBC 14.5 (H) 11/03/2021 1924   HGB 9.2 (L) 11/05/2021 1019   HGB 8.1 (L) 05/09/2019 1539   HCT 27.0 (L) 11/05/2021 1019   HCT 24.6 (L) 05/09/2019 1539   PLT 425 (H) 11/03/2021 1924   PLT 470 (H) 05/09/2019 1539    BMET    Component Value Date/Time   NA 144 11/05/2021 1019   K 3.6 11/05/2021 1019   CL 109 11/05/2021 1019   CO2 17 (L) 11/03/2021 1924   GLUCOSE 153 (H) 11/05/2021 1019   BUN 9 11/05/2021 1019   CREATININE 1.00 11/05/2021 1019   CALCIUM 9.9 11/03/2021 1924   GFRNONAA >60 11/03/2021 1924   GFRAA >60 11/23/2019 1505    Assessment/Planning: POD #1 s/p left BKA  Pain control suboptimal Will add toradol PT/OT Take dressing off Sunday    Sylvia [name]  11/06/2021, 8:37 AM

## 2021-11-06 NOTE — Progress Notes (Signed)
Spoke with the patient She has a wheelchair at home that was left out and got reusted, She has had this less than 5 years, therefore, insurance will not cover another one She is going to have her family go to the Hospice outlet and see if they can get her a new wheelchair She has a home health aide thru [facility] She has used [facility] before but not sure what the company name was I explained it is difficult to get [facility] set up for patient's with her insurance payer, I reached out to [place] at [place] to see if they can accept the patient, Awaiting a call back I reached out to [place], [place] and requested to see if they can accept the patient, They are not able to accept I reached out to [facility] with [employer] and requested to see if they can accept the patient, awaiting a call back I reached out to [place] with [facility] to see if they can accept the patient, awaiting a call back  I reached out to [place] with [facility] and they are not able to accept the patient I reached out to [place] with  [place] and am awaiting a call back I reached out to [place] with [place] and they are not able to accept the patient I reached out to [facility] and they will only accept patients from their hospital

## 2021-11-07 LAB — BASIC METABOLIC PANEL
Anion gap: 10 (ref 5–15)
BUN: 15 mg/dL (ref 8–23)
CO2: 22 mmol/L (ref 22–32)
Calcium: 9 mg/dL (ref 8.9–10.3)
Chloride: 110 mmol/L (ref 98–111)
Creatinine, Ser: 1.14 mg/dL — ABNORMAL HIGH (ref 0.44–1.00)
GFR, Estimated: 53 mL/min — ABNORMAL LOW (ref >60)
Glucose, Bld: 203 mg/dL — ABNORMAL HIGH (ref 70–99)
Potassium: 3.6 mmol/L (ref 3.5–5.1)
Sodium: 142 mmol/L (ref 135–145)

## 2021-11-07 LAB — GLUCOSE, CAPILLARY
Glucose-Capillary: 134 mg/dL — ABNORMAL HIGH (ref 70–99)
Glucose-Capillary: 159 mg/dL — ABNORMAL HIGH (ref 70–99)
Glucose-Capillary: 131 mg/dL — ABNORMAL HIGH (ref 70–99)
Glucose-Capillary: 140 mg/dL — ABNORMAL HIGH (ref 70–99)
Glucose-Capillary: 139 mg/dL — ABNORMAL HIGH (ref 70–99)

## 2021-11-07 LAB — CBC
HCT: 22.3 % — ABNORMAL LOW (ref 36.0–46.0)
Hemoglobin: 7.2 g/dL — ABNORMAL LOW (ref 12.0–15.0)
MCH: 27.4 pg (ref 26.0–34.0)
MCHC: 32.3 g/dL (ref 30.0–36.0)
MCV: 84.8 fL (ref 80.0–100.0)
Platelets: 337 10*3/uL (ref 150–400)
RBC: 2.63 MIL/uL — ABNORMAL LOW (ref 3.87–5.11)
RDW: 14.1 % (ref 11.5–15.5)
WBC: 7.6 10*3/uL (ref 4.0–10.5)
nRBC: 0 % (ref 0.0–0.2)

## 2021-11-07 LAB — HEMOGLOBIN AND HEMATOCRIT, BLOOD
HCT: 27 % — ABNORMAL LOW (ref 36.0–46.0)
Hemoglobin: 9 g/dL — ABNORMAL LOW (ref 12.0–15.0)

## 2021-11-07 LAB — PREPARE RBC (CROSSMATCH)

## 2021-11-07 MED ORDER — SODIUM CHLORIDE 0.9% IV SOLUTION
Freq: Once | INTRAVENOUS | Status: AC
Start: 2021-11-07 — End: 2021-11-07

## 2021-11-07 NOTE — Plan of Care (Signed)
  Problem: Nutritional: Goal: Maintenance of adequate nutrition will improve Outcome: Progressing   Problem: Skin Integrity: Goal: Risk for impaired skin integrity will decrease Outcome: Progressing   Problem: Activity: Goal: Ability to perform//tolerate increased activity and mobilize with assistive devices will improve Outcome: Progressing   Problem: Pain Management: Goal: Pain level will decrease with appropriate interventions Outcome: Progressing   Problem: Elimination: Goal: Will not experience complications related to bowel motility Outcome: Progressing

## 2021-11-07 NOTE — Plan of Care (Signed)
  Problem: Coping: Goal: Ability to adjust to condition or change in health will improve Outcome: Progressing   Problem: Fluid Volume: Goal: Ability to maintain a balanced intake and output will improve Outcome: Progressing   Problem: Health Behavior/Discharge Planning: Goal: Ability to identify and utilize available resources and services will improve Outcome: Progressing   Problem: Health Behavior/Discharge Planning: Goal: Ability to manage health-related needs will improve Outcome: Progressing   Problem: Metabolic: Goal: Ability to maintain appropriate glucose levels will improve Outcome: Progressing   Problem: Nutritional: Goal: Maintenance of adequate nutrition will improve Outcome: Progressing   Problem: Nutritional: Goal: Progress toward achieving an optimal weight will improve Outcome: Progressing   Problem: Skin Integrity: Goal: Risk for impaired skin integrity will decrease Outcome: Progressing   Problem: Tissue Perfusion: Goal: Adequacy of tissue perfusion will improve Outcome: Progressing   Problem: Education: Goal: Knowledge of the prescribed therapeutic regimen will improve Outcome: Progressing   Problem: Education: Goal: Understanding of discharge needs will improve Outcome: Progressing   Problem: Education: Goal: Ability to verbalize activity precautions or restrictions will improve Outcome: Progressing   Problem: Self-Concept: Goal: Ability to maintain and perform role responsibilities to the fullest extent possible will improve Outcome: Progressing   Problem: Pain Management: Goal: Pain level will decrease with appropriate interventions Outcome: Progressing

## 2021-11-07 NOTE — Progress Notes (Signed)
2 Days Post-Op   Subjective/Chief Complaint: Pain better controlled. Worked with PT yesterday. HgB 7.2- Stable overnight   Objective: Vital signs in last 24 hours: Temp:  [97.9 F (36.6 C)-98.7 F (37.1 C)] 98.7 F (37.1 C) (07/22 0843) Pulse Rate:  [77-91] 82 (07/22 0843) Resp:  [16-17] 16 (07/22 0843) BP: (124-165)/(70-76) 165/76 (07/22 0843) SpO2:  [98 %-100 %] 99 % (07/22 0843) Last BM Date : 11/04/21  Intake/Output from previous day: 07/21 0701 - 07/22 0700 In: 120 [P.O.:120] Out: -  Intake/Output this shift: Total I/O In: -  Out: 300 [Urine:300]  General appearance: alert and no distress Cardio: regular rate and rhythm Extremities: LEFT BKA dressing changed- no evidence of bleeding, incision- C/D/I, stump soft  Lab Results:  Recent Labs    11/06/21 0820 11/07/21 0415  WBC 12.9* 7.6  HGB 8.3* 7.2*  HCT 25.4* 22.3*  PLT 396 337   BMET Recent Labs    11/06/21 0820 11/07/21 0415  NA 142 142  K 3.4* 3.6  CL 111 110  CO2 24 22  GLUCOSE 147* 203*  BUN 13 15  CREATININE 1.02* 1.14*  CALCIUM 9.5 9.0   PT/INR No results for input(s): "LABPROT", "INR" in the last 72 hours. ABG No results for input(s): "PHART", "HCO3" in the last 72 hours.  Invalid input(s): "PCO2", "PO2"  Studies/Results: [place] OR NERVE BLOCK-IMAGE ONLY [facility])  Result Date: 11/05/2021 There is no interpretation for this exam.  This order is for images obtained during a surgical procedure.  Please See "Surgeries" Tab for more information regarding the procedure.    Anti-infectives: Anti-infectives (From admission, onward)    Start     Dose/Rate Route Frequency Ordered Stop   11/05/21 1545  hydroxychloroquine (PLAQUENIL) tablet 200 mg        200 mg Oral Daily 11/05/21 1448     11/05/21 1030  ciprofloxacin (CIPRO) IVPB 400 mg        400 mg 200 mL/hr over 60 Minutes Intravenous On call to O.R. 11/05/21 1015 11/05/21 1230   11/05/21 1024  ciprofloxacin (CIPRO) 400 MG/200ML IVPB        Note to Pharmacy: Gwynneth Aliment J: cabinet override      11/05/21 1024 11/05/21 1212       Assessment/Plan: s/p Procedure(s): AMPUTATION BELOW KNEE (Left) POD #2 Will TRANSFUSE 1U pRBC Pain control Continue PT    LOS: 2 days    Eli Hose A 11/07/2021

## 2021-11-07 NOTE — TOC Progression Note (Signed)
Transition of Care [facility]) - Progression Note    Patient Details  Name: Sylvia [name] MRN: [id number] Date of Birth: [birth year]  Transition [facility] Care [facility]) CM/SW Contact  Bing Quarry, RN Phone Number: 11/07/2021, 11:58 AM  Clinical Narrative: 7/22: Requested HH orders. Accepted and set up with [facility]  HH on discharge per prior CM notes. Gabriel Cirri RN CM       Expected Discharge Plan: Home w Home Health Services Barriers to Discharge: Continued Medical Work up, No Home Care Agency will accept this patient  Expected Discharge Plan and Services Expected Discharge Plan: Home w Home Health Services   Discharge Planning Services: CM Consult   Living arrangements for the past 2 months: Single Family Home Expected Discharge Date: 11/08/21                         HH Arranged: PT HH Agency: [facility] Date HH Agency Contacted: 11/06/21 Time HH Agency Contacted: 1002 Representative spoke with at [facility] Agency: Elnita Maxwell   Social Determinants of Health (SDOH) Interventions    Readmission Risk Interventions     No data to display

## 2021-11-07 NOTE — Progress Notes (Signed)
Physical Therapy Treatment Patient Details Name: Sylvia [name] MRN: [id number] DOB: 1955/09/06 Today's Date: 11/07/2021   History of Present Illness Pt is a 66 y/o F who failed multiple attemtps at revascularization of LLE. 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    PT Comments    Pt is motivated and progressing very well with mobility.  Pt donned R prosthesis independently and was able to perform transfers and gait with RW at [facility] level with cues for safe technique.  Current d/c plan is appropriate.   Recommendations for follow up therapy are one component of a multi-disciplinary discharge planning process, led by the attending physician.  Recommendations may be updated based on patient status, additional functional criteria and insurance authorization.  Follow Up Recommendations  Home health PT Can patient physically be transported by private vehicle: Yes   Assistance Recommended at Discharge Intermittent Supervision/Assistance  Patient can return home with the following A lot of help with walking and/or transfers;Assistance with cooking/housework;Help with stairs or ramp for entrance;Assist for transportation;A little help with bathing/dressing/bathroom   Equipment Recommendations  Wheelchair (measurements PT)    Recommendations for Other Services       Precautions / Restrictions Precautions Precautions: Fall Precaution Comments: R BKA with prosthesis Restrictions Weight Bearing Restrictions: No RLE Weight Bearing: Weight bearing as tolerated LLE Weight Bearing: Non weight bearing     Mobility  Bed Mobility Overal bed mobility: Modified Independent Bed Mobility: Sit to Supine     Supine to sit: Modified independent (Device/Increase time) Sit to supine: Modified independent (Device/Increase time)   General bed mobility comments: HOB elevated slightly, pt used bed rail minimally.    Transfers Overall transfer level: Needs assistance Equipment used:  Rolling walker (2 wheels) (R prosthesis) Transfers: Bed to chair/wheelchair/BSC Sit to Stand: Min assist Stand pivot transfers: Min assist         General transfer comment: sit<>stand: cues for hand placement and body mechanics.    Ambulation/Gait Ambulation/Gait assistance: Min guard, Min assist Gait Distance (Feet): 30 Feet Assistive device: Rolling walker (2 wheels) (R prosthesis donned) Gait Pattern/deviations:  (hopping pattern,) Gait velocity: decreased     General Gait Details: lowered RW 1 level for increased UE support for "hopping" pattern with R prosthesis donned.   Stairs             Wheelchair Mobility    Modified Rankin (Stroke Patients Only)       Balance Overall balance assessment: Needs assistance Sitting-balance support: Feet supported (prosthesis donned) Sitting balance-Leahy Scale: Good     Standing balance support: Bilateral upper extremity supported Standing balance-Leahy Scale: Fair Standing balance comment: prosthesis donned                            Cognition Arousal/Alertness: Awake/alert Behavior During Therapy: WFL for tasks assessed/performed Overall Cognitive Status: Within Functional Limits for tasks assessed                                          Exercises Other Exercises Other Exercises: Family education: wearing R prothesis can decrease fall risk especially with initial d/c home.  Recommend pt keep to similar wear schedule of prothesis while awaiting L prosthesis.    General Comments        Pertinent Vitals/Pain Pain Assessment Pain Assessment: No/denies pain  Home Living                          Prior Function            PT Goals (current goals can now be found in the care plan section) Acute Rehab PT Goals Patient Stated Goal: decreased pain PT Goal Formulation: With patient Time For Goal Achievement: 11/20/21 Potential to Achieve Goals: Good Additional  Goals Additional Goal #1: Pt will propel w/c x 150 ft with mod I to increase independence with functional mobility. Progress towards PT goals: Progressing toward goals    Frequency    7X/week      PT Plan Current plan remains appropriate    Co-evaluation              AM-PAC PT "6 Clicks" Mobility   Outcome Measure  Help needed turning from your back to your side while in a flat bed without using bedrails?: None Help needed moving from lying on your back to sitting on the side of a flat bed without using bedrails?: None Help needed moving to and from a bed to a chair (including a wheelchair)?: A Little Help needed standing up from a chair using your arms (e.g., wheelchair or bedside chair)?: A Little Help needed to walk in hospital room?: A Lot Help needed climbing 3-5 steps with a railing? : Total 6 Click Score: 17    End of Session Equipment Utilized During Treatment: Gait belt Activity Tolerance: Patient tolerated treatment well Patient left: in chair;with call bell/phone within reach Nurse Communication: Mobility status PT Visit Diagnosis: Other abnormalities of gait and mobility (R26.89);Muscle weakness (generalized) (M62.81);Pain     Time: 0920-0959 PT Time Calculation (min) (ACUTE ONLY): 39 min  Charges:  $Gait Training: 8-22 mins $Therapeutic Activity: 23-37 mins                   Hortencia Conradi, PTA  11/07/21, 12:07 PM

## 2021-11-07 NOTE — Plan of Care (Signed)
  Problem: Coping: Goal: Ability to adjust to condition or change in health will improve Outcome: Progressing   Problem: Fluid Volume: Goal: Ability to maintain a balanced intake and output will improve Outcome: Progressing   Problem: Health Behavior/Discharge Planning: Goal: Ability to identify and utilize available resources and services will improve Outcome: Progressing   Problem: Metabolic: Goal: Ability to maintain appropriate glucose levels will improve Outcome: Progressing   Problem: Nutritional: Goal: Progress toward achieving an optimal weight will improve Outcome: Progressing   Problem: Skin Integrity: Goal: Risk for impaired skin integrity will decrease Outcome: Progressing   Problem: Tissue Perfusion: Goal: Adequacy of tissue perfusion will improve Outcome: Progressing   Problem: Education: Goal: Understanding of discharge needs will improve Outcome: Progressing   Problem: Education: Goal: Knowledge of the prescribed therapeutic regimen will improve Outcome: Progressing   Problem: Education: Goal: Ability to verbalize activity precautions or restrictions will improve Outcome: Progressing   Problem: Activity: Goal: Ability to perform//tolerate increased activity and mobilize with assistive devices will improve Outcome: Progressing   Problem: Clinical Measurements: Goal: Postoperative complications will be avoided or minimized Outcome: Progressing   Problem: Self-Care: Goal: Ability to meet self-care needs will improve Outcome: Progressing   Problem: Pain Management: Goal: Pain level will decrease with appropriate interventions Outcome: Progressing   Problem: Skin Integrity: Goal: Demonstration of wound healing without infection will improve Outcome: Progressing   Problem: Clinical Measurements: Goal: Respiratory complications will improve Outcome: Progressing   Problem: Clinical Measurements: Goal: Cardiovascular complication will be  avoided Outcome: Progressing

## 2021-11-08 LAB — TYPE AND SCREEN
ABO/RH(D): O POS
Antibody Screen: NEGATIVE
Unit division: 0

## 2021-11-08 LAB — BPAM RBC
Blood Product Expiration Date: 202308262359
ISSUE DATE / TIME: 202307221441
Unit Type and Rh: 5100

## 2021-11-08 LAB — GLUCOSE, CAPILLARY
Glucose-Capillary: 120 mg/dL — ABNORMAL HIGH (ref 70–99)
Glucose-Capillary: 129 mg/dL — ABNORMAL HIGH (ref 70–99)
Glucose-Capillary: 97 mg/dL (ref 70–99)
Glucose-Capillary: 111 mg/dL — ABNORMAL HIGH (ref 70–99)

## 2021-11-08 NOTE — Progress Notes (Signed)
3 Days Post-Op   Subjective/Chief Complaint: Doing OK. Complains of knee pain- states she slept with bent knee. Pain controlled at stump incision   Objective: Vital signs in last 24 hours: Temp:  [98.1 F (36.7 C)-98.7 F (37.1 C)] 98.2 F (36.8 C) (07/23 0802) Pulse Rate:  [54-98] 98 (07/23 0802) Resp:  [16-18] 16 (07/23 0802) BP: (114-162)/(62-80) 142/80 (07/23 0802) SpO2:  [94 %-100 %] 98 % (07/23 0802) Last BM Date : 11/07/21  Intake/Output from previous day: 07/22 0701 - 07/23 0700 In: 586 [I.V.:86; Blood:350; IV Piggyback:150] Out: 300 [Urine:300] Intake/Output this shift: No intake/output data recorded.  General appearance: alert and no distress Cardio: regular rate and rhythm Extremities: LEFT BKA stump- warm, C/D/I  Lab Results:  Recent Labs    11/06/21 0820 11/07/21 0415 11/07/21 2008  WBC 12.9* 7.6  --   HGB 8.3* 7.2* 9.0*  HCT 25.4* 22.3* 27.0*  PLT 396 337  --    BMET Recent Labs    11/06/21 0820 11/07/21 0415  NA 142 142  K 3.4* 3.6  CL 111 110  CO2 24 22  GLUCOSE 147* 203*  BUN 13 15  CREATININE 1.02* 1.14*  CALCIUM 9.5 9.0   PT/INR No results for input(s): "LABPROT", "INR" in the last 72 hours. ABG No results for input(s): "PHART", "HCO3" in the last 72 hours.  Invalid input(s): "PCO2", "PO2"  Studies/Results: No results found.  Anti-infectives: Anti-infectives (From admission, onward)    Start     Dose/Rate Route Frequency Ordered Stop   11/05/21 1545  hydroxychloroquine (PLAQUENIL) tablet 200 mg        200 mg Oral Daily 11/05/21 1448     11/05/21 1030  ciprofloxacin (CIPRO) IVPB 400 mg        400 mg 200 mL/hr over 60 Minutes Intravenous On call to O.R. 11/05/21 1015 11/05/21 1230   11/05/21 1024  ciprofloxacin (CIPRO) 400 MG/200ML IVPB       Note to Pharmacy: Gwynneth Aliment J: cabinet override      11/05/21 1024 11/05/21 1212       Assessment/Plan: s/p Procedure(s): AMPUTATION BELOW KNEE (Left) POD #3 Will place  knee immobilizer Daily dressing change Likely home tomorrow with PT  LOS: 3 days    Eli Hose A 11/08/2021

## 2021-11-08 NOTE — Progress Notes (Signed)
New dressing application completed per verbal order from vascular doctor. Knee immobilizer in place and PRN pain med given.

## 2021-11-08 NOTE — Plan of Care (Signed)
  Problem: Coping: Goal: Ability to adjust to condition or change in health will improve Outcome: Progressing   Problem: Fluid Volume: Goal: Ability to maintain a balanced intake and output will improve Outcome: Progressing   Problem: Nutritional: Goal: Maintenance of adequate nutrition will improve Outcome: Progressing   Problem: Skin Integrity: Goal: Risk for impaired skin integrity will decrease Outcome: Progressing   Problem: Activity: Goal: Ability to perform//tolerate increased activity and mobilize with assistive devices will improve Outcome: Progressing   Problem: Pain Managment: Goal: General experience of comfort will improve Outcome: Progressing   Problem: Elimination: Goal: Will not experience complications related to bowel motility Outcome: Progressing   Problem: Safety: Goal: Ability to remain free from injury will improve Outcome: Progressing

## 2021-11-08 NOTE — Progress Notes (Signed)
Physical Therapy Treatment Patient Details Name: Sylvia [name] MRN: [id number] DOB: Jan 23, 1956 Today's Date: 11/08/2021   History of Present Illness Pt is a 66 y/o F who failed multiple attemtps at revascularization of LLE. 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    PT Comments    Patient received in bed, she reports she is hurting, but willing to participate. Patient is motivated to improve mobility. She is mod independent with bed mobility and transfer with min A. Patient is able to ambulate 30 feet with RW and min guard. She will continue to benefit from skilled PT while here to improve functional independence, strength and safety.       Recommendations for follow up therapy are one component of a multi-disciplinary discharge planning process, led by the attending physician.  Recommendations may be updated based on patient status, additional functional criteria and insurance authorization.  Follow Up Recommendations  Home health PT Can patient physically be transported by private vehicle: Yes   Assistance Recommended at Discharge Intermittent Supervision/Assistance  Patient can return home with the following A little help with walking and/or transfers;A little help with bathing/dressing/bathroom;Help with stairs or ramp for entrance;Assist for transportation;Assistance with cooking/housework   Equipment Recommendations       Recommendations for Other Services       Precautions / Restrictions Precautions Precautions: Fall Precaution Comments: R BKA with prosthesis Restrictions Weight Bearing Restrictions: Yes RLE Weight Bearing: Weight bearing as tolerated LLE Weight Bearing: Non weight bearing     Mobility  Bed Mobility Overal bed mobility: Modified Independent Bed Mobility: Supine to Sit, Sit to Supine     Supine to sit: Modified independent (Device/Increase time) Sit to supine: Modified independent (Device/Increase time)        Transfers Overall transfer level:  Needs assistance Equipment used: Rolling walker (2 wheels) Transfers: Sit to/from Stand Sit to Stand: Min guard                Ambulation/Gait Ambulation/Gait assistance: Min guard Gait Distance (Feet): 30 Feet Assistive device: Rolling walker (2 wheels) Gait Pattern/deviations: Step-to pattern Gait velocity: decreased     General Gait Details: Lowered RW for effective UE use when hopping.   Stairs             Wheelchair Mobility    Modified Rankin (Stroke Patients Only)       Balance Overall balance assessment: Needs assistance Sitting-balance support: Feet supported Sitting balance-Leahy Scale: Normal     Standing balance support: Bilateral upper extremity supported, During functional activity, Reliant on assistive device for balance Standing balance-Leahy Scale: Fair Standing balance comment: prosthesis donned                            Cognition Arousal/Alertness: Awake/alert Behavior During Therapy: WFL for tasks assessed/performed Overall Cognitive Status: Within Functional Limits for tasks assessed                                          Exercises Amputee Exercises Quad Sets: AROM, Both, 10 reps Gluteal Sets: AROM, Both, 10 reps Hip ABduction/ADduction: AROM, Both, 15 reps    General Comments        Pertinent Vitals/Pain Pain Assessment Pain Assessment: Faces Faces Pain Scale: Hurts even more Breathing: occasional labored breathing, short period of hyperventilation Negative Vocalization: occasional moan/groan, low speech, negative/disapproving quality Facial  Expression: facial grimacing Body Language: relaxed Consolability: no need to console PAINAD Score: 4 Pain Location: L residual limb Pain Descriptors / Indicators: Discomfort, Guarding, Grimacing Pain Intervention(s): Monitored during session, Repositioned    Home Living                          Prior Function            PT Goals  (current goals can now be found in the care plan section) Acute Rehab PT Goals Patient Stated Goal: decreased pain PT Goal Formulation: With patient Time For Goal Achievement: 11/20/21 Potential to Achieve Goals: Good Additional Goals Additional Goal #1: Pt will propel w/c x 150 ft with mod I to increase independence with functional mobility. Progress towards PT goals: Progressing toward goals    Frequency    7X/week      PT Plan Current plan remains appropriate    Co-evaluation              AM-PAC PT "6 Clicks" Mobility   Outcome Measure  Help needed turning from your back to your side while in a flat bed without using bedrails?: None Help needed moving from lying on your back to sitting on the side of a flat bed without using bedrails?: None Help needed moving to and from a bed to a chair (including a wheelchair)?: A Little Help needed standing up from a chair using your arms (e.g., wheelchair or bedside chair)?: A Little Help needed to walk in hospital room?: A Little Help needed climbing 3-5 steps with a railing? : A Lot 6 Click Score: 19    End of Session Equipment Utilized During Treatment: Gait belt Activity Tolerance: Patient tolerated treatment well Patient left: in bed;with bed alarm set;with call bell/phone within reach Nurse Communication: Mobility status PT Visit Diagnosis: Other abnormalities of gait and mobility (R26.89);Muscle weakness (generalized) (M62.81);Pain Pain - Right/Left: Left Pain - part of body: Leg     Time: 1420-1435 PT Time Calculation (min) (ACUTE ONLY): 15 min  Charges:  $Gait Training: 8-22 mins                     Lyzbeth Genrich, PT, GCS 11/08/21,2:46 PM

## 2021-11-09 LAB — GLUCOSE, CAPILLARY
Glucose-Capillary: 123 mg/dL — ABNORMAL HIGH (ref 70–99)
Glucose-Capillary: 137 mg/dL — ABNORMAL HIGH (ref 70–99)
Glucose-Capillary: 91 mg/dL (ref 70–99)
Glucose-Capillary: 98 mg/dL (ref 70–99)

## 2021-11-09 LAB — SURGICAL PATHOLOGY

## 2021-11-09 MED ORDER — SODIUM CHLORIDE 0.9 % IV SOLN: INTRAVENOUS | Status: DC | PRN

## 2021-11-09 MED ORDER — OXYCODONE HCL 10 MG PO TABS
10.0000 mg | ORAL_TABLET | Freq: Four times a day (QID) | ORAL | 0 refills | Status: DC | PRN
Start: 2021-11-09 — End: 2021-11-10

## 2021-11-09 MED ORDER — DULOXETINE HCL 30 MG PO CPEP: 30.0000 mg | ORAL_CAPSULE | Freq: Every day | ORAL | 3 refills | Status: DC

## 2021-11-09 NOTE — Care Management Important Message (Signed)
Important Message  Patient Details  Name: Sylvia [name] MRN: [id number] Date of Birth: 1956/01/27   Medicare Important Message Given:  Yes     Olegario Messier A Zacari Stiff 11/09/2021, 11:09 AM

## 2021-11-09 NOTE — Plan of Care (Signed)
  Problem: Coping: Goal: Ability to adjust to condition or change in health will improve Outcome: Progressing   Problem: Fluid Volume: Goal: Ability to maintain a balanced intake and output will improve Outcome: Progressing   Problem: Health Behavior/Discharge Planning: Goal: Ability to identify and utilize available resources and services will improve Outcome: Progressing   Problem: Health Behavior/Discharge Planning: Goal: Ability to manage health-related needs will improve Outcome: Progressing   Problem: Metabolic: Goal: Ability to maintain appropriate glucose levels will improve Outcome: Progressing   Problem: Nutritional: Goal: Maintenance of adequate nutrition will improve Outcome: Progressing   Problem: Nutritional: Goal: Progress toward achieving an optimal weight will improve Outcome: Progressing   Problem: Skin Integrity: Goal: Risk for impaired skin integrity will decrease Outcome: Progressing   Problem: Tissue Perfusion: Goal: Adequacy of tissue perfusion will improve Outcome: Progressing   Problem: Education: Goal: Knowledge of the prescribed therapeutic regimen will improve Outcome: Progressing   Problem: Education: Goal: Ability to verbalize activity precautions or restrictions will improve Outcome: Progressing   Problem: Education: Goal: Understanding of discharge needs will improve Outcome: Progressing   Problem: Activity: Goal: Ability to perform//tolerate increased activity and mobilize with assistive devices will improve Outcome: Progressing   Problem: Clinical Measurements: Goal: Postoperative complications will be avoided or minimized Outcome: Progressing   Problem: Self-Care: Goal: Ability to meet self-care needs will improve Outcome: Progressing   Problem: Self-Concept: Goal: Ability to maintain and perform role responsibilities to the fullest extent possible will improve Outcome: Progressing   Problem: Pain Management: Goal:  Pain level will decrease with appropriate interventions Outcome: Progressing   Problem: Skin Integrity: Goal: Demonstration of wound healing without infection will improve Outcome: Progressing   Problem: Education: Goal: Knowledge of General Education information will improve Description: Including pain rating scale, medication(s)/side effects and non-pharmacologic comfort measures Outcome: Progressing   Problem: Health Behavior/Discharge Planning: Goal: Ability to manage health-related needs will improve Outcome: Progressing   Problem: Clinical Measurements: Goal: Ability to maintain clinical measurements within normal limits will improve Outcome: Progressing   Problem: Clinical Measurements: Goal: Will remain free from infection Outcome: Progressing   Problem: Clinical Measurements: Goal: Diagnostic test results will improve Outcome: Progressing   Problem: Clinical Measurements: Goal: Respiratory complications will improve Outcome: Progressing   Problem: Clinical Measurements: Goal: Cardiovascular complication will be avoided Outcome: Progressing   Problem: Activity: Goal: Risk for activity intolerance will decrease Outcome: Progressing   Problem: Nutrition: Goal: Adequate nutrition will be maintained Outcome: Progressing   Problem: Coping: Goal: Level of anxiety will decrease Outcome: Progressing   Problem: Elimination: Goal: Will not experience complications related to bowel motility Outcome: Progressing   Problem: Elimination: Goal: Will not experience complications related to urinary retention Outcome: Progressing   Problem: Pain Managment: Goal: General experience of comfort will improve Outcome: Progressing   Problem: Safety: Goal: Ability to remain free from injury will improve Outcome: Progressing   Problem: Skin Integrity: Goal: Risk for impaired skin integrity will decrease Outcome: Progressing

## 2021-11-09 NOTE — Progress Notes (Signed)
Physical Therapy Treatment Patient Details Name: Sylvia [name] MRN: [id number] DOB: 1956/01/17 Today's Date: 11/09/2021   History of Present Illness Pt is a 66 y/o F who failed multiple attemtps at revascularization of LLE. 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    PT Comments    Pt initially unsure about returning home with new L BKA. However, once up and mobilizing, pt sated she would feel comfortable going home with family support and HHPT initially. Pt is limited by 10/10 pain in the residual limb, yet pushes herself to complete desired tasks. Will continue PT per POC if pt does not return home today.    Recommendations for follow up therapy are one component of a multi-disciplinary discharge planning process, led by the attending physician.  Recommendations may be updated based on patient status, additional functional criteria and insurance authorization.  Follow Up Recommendations  Home health PT Can patient physically be transported by private vehicle: Yes   Assistance Recommended at Discharge Intermittent Supervision/Assistance  Patient can return home with the following A little help with walking and/or transfers;A little help with bathing/dressing/bathroom;Help with stairs or ramp for entrance;Assist for transportation;Assistance with Charity fundraiser (measurements PT)    Recommendations for Other Services OT consult     Precautions / Restrictions Precautions Precautions: Fall Precaution Comments: R BKA with prosthesis Required Braces or Orthoses: Knee Immobilizer - Left (to prevent knee flexion contracture) Knee Immobilizer - Left: Other (comment) (Per MD recs) Restrictions Weight Bearing Restrictions: Yes RLE Weight Bearing: Weight bearing as tolerated LLE Weight Bearing: Non weight bearing     Mobility  Bed Mobility Overal bed mobility: Modified Independent Bed Mobility: Supine to Sit     Supine to sit: Modified  independent (Device/Increase time), HOB elevated          Transfers Overall transfer level: Needs assistance Equipment used: Rolling walker (2 wheels) Transfers: Sit to/from Stand, Bed to chair/wheelchair/BSC Sit to Stand: Min guard          Lateral/Scoot Transfers: Supervision General transfer comment: sit<>stand: cues for hand placement and body mechanics.    Ambulation/Gait Ambulation/Gait assistance: Min guard Gait Distance (Feet): 30 Feet Assistive device: Rolling walker (2 wheels) Gait Pattern/deviations: Step-to pattern Gait velocity: decreased         Stairs             Wheelchair Mobility    Modified Rankin (Stroke Patients Only)       Balance Overall balance assessment: Needs assistance Sitting-balance support: Feet supported Sitting balance-Leahy Scale: Normal     Standing balance support: Bilateral upper extremity supported, During functional activity, Reliant on assistive device for balance Standing balance-Leahy Scale: Fair Standing balance comment: prosthesis donned                            Cognition Arousal/Alertness: Awake/alert Behavior During Therapy: WFL for tasks assessed/performed Overall Cognitive Status: Within Functional Limits for tasks assessed                                          Exercises      General Comments General comments (skin integrity, edema, etc.):  (Pt given BKA handout including education on stump care, positioning, contracture management and exercises with good understanding.)      Pertinent Vitals/Pain Pain Assessment Pain Assessment: 0-10 Pain Score:  10-Worst pain ever Pain Location: L residual limb Pain Descriptors / Indicators: Discomfort, Guarding, Grimacing Pain Intervention(s): Monitored during session, Patient requesting pain meds-RN notified, Repositioned    Home Living                          Prior Function            PT Goals (current  goals can now be found in the care plan section) Acute Rehab PT Goals Patient Stated Goal: decreased pain Progress towards PT goals: Progressing toward goals    Frequency    7X/week      PT Plan Current plan remains appropriate    Co-evaluation              AM-PAC PT "6 Clicks" Mobility   Outcome Measure  Help needed turning from your back to your side while in a flat bed without using bedrails?: None Help needed moving from lying on your back to sitting on the side of a flat bed without using bedrails?: None Help needed moving to and from a bed to a chair (including a wheelchair)?: A Little Help needed standing up from a chair using your arms (e.g., wheelchair or bedside chair)?: A Little Help needed to walk in hospital room?: A Little Help needed climbing 3-5 steps with a railing? : A Lot 6 Click Score: 19    End of Session Equipment Utilized During Treatment: Gait belt Activity Tolerance: Patient tolerated treatment well Patient left: in chair;with call bell/phone within reach Nurse Communication: Mobility status;Patient requests pain meds PT Visit Diagnosis: Other abnormalities of gait and mobility (R26.89);Muscle weakness (generalized) (M62.81);Pain Pain - Right/Left: Left Pain - part of body: Leg     Time: 2637-8588 PT Time Calculation (min) (ACUTE ONLY): 45 min  Charges:  $Gait Training: 8-22 mins $Therapeutic Exercise: 8-22 mins $Therapeutic Activity: 8-22 mins                    Zadie Cleverly, PTA   Jannet Askew 11/09/2021, 12:45 PM

## 2021-11-09 NOTE — Progress Notes (Signed)
Occupational Therapy Treatment Patient Details Name: Sylvia [name] MRN: [id number] DOB: Mar 07, 1956 Today's Date: 11/09/2021   History of present illness Pt is a 66 y/o F who failed multiple attemtps at revascularization of LLE. 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    OT comments  [name] presents today with generalized weakness, impaired balance, limited endurance, and 10/10 pain, impacting her ability to safely perform fxl mobility tasks. She requires Mod A to power up into standing, requires Mod A to maintain standing balance, is able to ambulate ~ 10 ft before returning to sitting, demonstrating poor standing balance. Unable to perform grooming tasks in standing, can complete with Mod I once seated. Recommend DC to home with both HHOT and PT, and 24/7 SUPV from family and friends during initial stages of rehab process.    Recommendations for follow up therapy are one component of a multi-disciplinary discharge planning process, led by the attending physician.  Recommendations may be updated based on patient status, additional functional criteria and insurance authorization.    Follow Up Recommendations  Home health OT    Assistance Recommended at Discharge    Patient can return home with the following  A little help with walking and/or transfers;A little help with bathing/dressing/bathroom;Assistance with cooking/housework;Assist for transportation;Help with stairs or ramp for entrance   Equipment Recommendations       Recommendations for Other Services      Precautions / Restrictions Precautions Precautions: Fall Precaution Comments: R BKA with prosthesis Required Braces or Orthoses: Knee Immobilizer - Left (to prevent knee flexion contracture) Knee Immobilizer - Left: Other (comment) (Per MD recs) Restrictions Weight Bearing Restrictions: Yes RLE Weight Bearing: Weight bearing as tolerated LLE Weight Bearing: Non weight bearing       Mobility Bed Mobility                General bed mobility comments: received/left in recliner    Transfers Overall transfer level: Needs assistance Equipment used: Rolling walker (2 wheels) Transfers: Sit to/from Stand Sit to Stand: Mod assist           General transfer comment: Mod A for sit<>stand, cueing for hand placement on RW     Balance Overall balance assessment: Needs assistance Sitting-balance support: Feet unsupported Sitting balance-Leahy Scale: Good     Standing balance support: Reliant on assistive device for balance, Bilateral upper extremity supported, During functional activity Standing balance-Leahy Scale: Poor Standing balance comment: R prosthesis donned                           ADL either performed or assessed with clinical judgement   ADL Overall ADL's : Needs assistance/impaired     Grooming: Wash/dry hands;Wash/dry face;Oral care Grooming Details (indicate cue type and reason): performs in sitting, unable to complete in standing                                    Extremity/Trunk Assessment Upper Extremity Assessment Upper Extremity Assessment: Overall WFL for tasks assessed   Lower Extremity Assessment RLE Deficits / Details: hx of R BKA, pt attempting to extend L knee and tolerates full ROM but is very pain limited; dressing on distal end of residual limb        Vision       Perception     Praxis      Cognition Arousal/Alertness: Awake/alert Behavior  During Therapy: WFL for tasks assessed/performed Overall Cognitive Status: Within Functional Limits for tasks assessed                                          Exercises Other Exercises Other Exercises: Educ re: falls prevention, home modifications, pain mgmt    Shoulder Instructions       General Comments  (Pt given BKA handout including education on stump care, positioning, contracture management and exercises with good understanding.)    Pertinent  Vitals/ Pain       Pain Assessment Pain Assessment: 0-10 Pain Score: 10-Worst pain ever Pain Descriptors / Indicators: Aching, Grimacing, Guarding Pain Intervention(s): Repositioned, Limited activity within patient's tolerance  Home Living                                          Prior Functioning/Environment              Frequency  Min 2X/week        Progress Toward Goals  OT Goals(current goals can now be found in the care plan section)  Progress towards OT goals: Progressing toward goals  Acute Rehab OT Goals OT Goal Formulation: With patient Time For Goal Achievement: 11/20/21 Potential to Achieve Goals: Good  Plan Frequency remains appropriate;Discharge plan needs to be updated    Co-evaluation                 AM-PAC OT "6 Clicks" Daily Activity     Outcome Measure   Help from another person eating meals?: None Help from another person taking care of personal grooming?: A Little Help from another person toileting, which includes using toliet, bedpan, or urinal?: A Little Help from another person bathing (including washing, rinsing, drying)?: A Little Help from another person to put on and taking off regular upper body clothing?: None Help from another person to put on and taking off regular lower body clothing?: A Little 6 Click Score: 20    End of Session Equipment Utilized During Treatment: Rolling walker (2 wheels)  OT Visit Diagnosis: Other abnormalities of gait and mobility (R26.89) Pain - Right/Left: Left Pain - part of body: Leg   Activity Tolerance Patient tolerated treatment well;Patient limited by pain   Patient Left in chair;with family/visitor present;with call bell/phone within reach   Nurse Communication Mobility status        Time: 1594-5859 OT Time Calculation (min): 22 min  Charges: OT General Charges $OT Visit: 1 Visit OT Treatments $Self Care/Home Management : 8-22 mins  Latina Craver, PhD, MS,  OTR/L 11/09/21, 2:57 PM

## 2021-11-09 NOTE — Plan of Care (Signed)
  Problem: Coping: Goal: Ability to adjust to condition or change in health will improve Outcome: Progressing   Problem: Health Behavior/Discharge Planning: Goal: Ability to identify and utilize available resources and services will improve Outcome: Progressing   Problem: Metabolic: Goal: Ability to maintain appropriate glucose levels will improve Outcome: Progressing   Problem: Pain Management: Goal: Pain level will decrease with appropriate interventions Outcome: Progressing

## 2021-11-09 NOTE — Discharge Summary (Signed)
[facility]    Discharge Summary    Patient ID:  Sylvia [name] MRN: [id number] DOB/AGE: Aug 26, 1955 66 y.o.  Admit date: 11/05/2021 Discharge date: 11/09/2021 Date of Surgery: 11/05/2021 Surgeon: Surgeon(s): Wyn Quaker Marlow Baars, MD  Admission Diagnosis: Atherosclerosis of artery of extremity with rest pain [facility]) [I70.229]  Discharge Diagnoses:  Atherosclerosis of artery of extremity with rest pain [facility]) [I70.229]  Secondary Diagnoses: Past Medical History:  Diagnosis Date   Anemia of chronic disease 05/16/2019   Anxiety    h/o   Arthritis    Cocaine abuse (HCC)    COPD (chronic obstructive pulmonary disease) (HCC)    Depression    Diabetes mellitus without complication (HCC)    DKA (diabetic ketoacidosis) (HCC)    Elevated troponin    GERD (gastroesophageal reflux disease)    GI bleed    Hypertension    bp under control-off meds since 2019   Ischemic leg     Procedure(s): AMPUTATION BELOW KNEE  Discharged Condition: good  HPI:  Sylvia [name] is a 66 year old female who presented to [facility] on [facility]/2021 for left below-knee amputation.  The patient prior to amputation had multiple attempts at left lower extremity revascularization with failure.  Ultimately were all left with an option of a femoropopliteal bypass however if the patient had failure of this bypass she would have to undergo an above-knee amputation.  Patient with the choices of below-knee amputation versus bypass with a high failure rate, the patient elected to undergo a below-knee amputation.  Following the amputation, the patient is doing well.  She does have some postsurgical pain.  She is working diligently with keeping her knee straight and currently has a knee immobilizer.  Hospital Course:  Sylvia [name] is a 66 y.o. female is S/P Left Below knee amputation  Procedure(s): AMPUTATION BELOW KNEE Extubated: POD # 0 Physical exam: Clean dry and  intact, patient having some pain which is expected.  Still working on keeping it straight with knee immobilizer Post-op wounds clean, dry, intact or healing well Pt. Ambulating, voiding and taking PO diet without difficulty. Pt pain controlled with PO pain meds. Labs as below Complications:none  Consults:    Significant Diagnostic Studies: CBC Lab Results  Component Value Date   WBC 7.6 11/07/2021   HGB 9.0 (L) 11/07/2021   HCT 27.0 (L) 11/07/2021   MCV 84.8 11/07/2021   PLT 337 11/07/2021    BMET    Component Value Date/Time   NA 142 11/07/2021 0415   K 3.6 11/07/2021 0415   CL 110 11/07/2021 0415   CO2 22 11/07/2021 0415   GLUCOSE 203 (H) 11/07/2021 0415   BUN 15 11/07/2021 0415   CREATININE 1.14 (H) 11/07/2021 0415   CALCIUM 9.0 11/07/2021 0415   GFRNONAA 53 (L) 11/07/2021 0415   GFRAA >60 11/23/2019 1505   COAG Lab Results  Component Value Date   INR 1.3 (H) 09/21/2021   INR 1.1 12/25/2020   INR 1.0 07/25/2019     Disposition:  Discharge to :Home  Allergies as of 11/09/2021       Reactions   Vancomycin Rash   Patient developed a rash to injection site and arm a few mintes after starting ABX.    Hydrocodone Rash   Metformin And Related Diarrhea   Penicillins Hives   Has patient had a PCN reaction causing immediate rash, facial/tongue/throat swelling, SOB or lightheadedness with hypotension: Yes Has patient had a PCN reaction causing severe rash  involving mucus membranes or skin necrosis: No Has patient had a PCN reaction that required hospitalization: No Has patient had a PCN reaction occurring within the last 10 years: No If all of the above answers are "NO", then may proceed with Cephalosporin use.   Tramadol Itching        Medication List     TAKE these medications    acetaminophen 325 MG tablet Commonly known as: TYLENOL Take 650 mg by mouth every 6 (six) hours as needed.   albuterol 108 (90 Base) MCG/ACT inhaler Commonly known as:  VENTOLIN HFA Inhale 2 puffs into the lungs every 6 (six) hours as needed for wheezing or shortness of breath.   apixaban 5 MG Tabs tablet Commonly known as: ELIQUIS Take 1 tablet (5 mg total) by mouth 2 (two) times daily.   aspirin 81 MG chewable tablet Chew 1 tablet (81 mg total) by mouth daily.   atorvastatin 10 MG tablet Commonly known as: LIPITOR Take 10 mg by mouth at bedtime.   buPROPion 150 MG 12 hr tablet Commonly known as: ZYBAN Take 150 mg by mouth 2 (two) times daily.   cetirizine 10 MG tablet Commonly known as: ZYRTEC Take 10 mg by mouth daily as needed for allergies.   cyanocobalamin 500 MCG tablet Commonly known as: CYANOCOBALAMIN Take 1 tablet (500 mcg total) by mouth daily.   docusate sodium 100 MG capsule Commonly known as: COLACE Take 100 mg by mouth daily.   DULoxetine 30 MG capsule Commonly known as: CYMBALTA Take 1 capsule (30 mg total) by mouth daily. What changed:  when to take this reasons to take this   famotidine 20 MG tablet Commonly known as: Pepcid Take 1 tablet (20 mg total) by mouth at bedtime.   ferrous sulfate 325 (65 FE) MG tablet Take 325 mg by mouth daily.   folic acid 1 MG tablet Commonly known as: FOLVITE Take 1 mg by mouth daily.   gabapentin 300 MG capsule Commonly known as: NEURONTIN TAKE 2 CAPSULES BY MOUTH IN THE  MORNING AND 3 CAPSULES BY MOUTH  IN THE EVENING   hydroxychloroquine 200 MG tablet Commonly known as: PLAQUENIL Take 200 mg by mouth daily.   Jardiance 10 MG Tabs tablet Generic drug: empagliflozin Take 10 mg by mouth every morning.   lisinopril 10 MG tablet Commonly known as: ZESTRIL Take 1 tablet (10 mg total) by mouth daily. What changed: when to take this   ondansetron 4 MG disintegrating tablet Commonly known as: ZOFRAN-ODT Take 1 tablet (4 mg total) by mouth every 8 (eight) hours as needed for nausea or vomiting.   Oxycodone HCl 10 MG Tabs Take 1 tablet (10 mg total) by mouth every 6 (six)  hours as needed. What changed:  medication strength how much to take reasons to take this   pantoprazole 40 MG tablet Commonly known as: PROTONIX Take 1 tablet (40 mg total) by mouth daily. What changed: when to take this   sitaGLIPtin 100 MG tablet Commonly known as: JANUVIA Take 100 mg by mouth every morning.   WOMENS MULTIVITAMIN PO Take 1 tablet by mouth daily.       Verbal and written Discharge instructions given to the patient. Wound care per Discharge AVS  Follow-up Information     Georgiana Spinner, NP Follow up in 3 week(s).   Specialty: Vascular Surgery Contact information: [address] [facility] [place] [address] [phone]  Signed: Georgiana Spinner, NP  11/09/2021, 1:57 PM

## 2021-11-10 ENCOUNTER — Other Ambulatory Visit (INDEPENDENT_AMBULATORY_CARE_PROVIDER_SITE_OTHER): Payer: Self-pay | Admitting: Nurse Practitioner

## 2021-11-10 LAB — GLUCOSE, CAPILLARY
Glucose-Capillary: 140 mg/dL — ABNORMAL HIGH (ref 70–99)
Glucose-Capillary: 113 mg/dL — ABNORMAL HIGH (ref 70–99)

## 2021-11-10 MED ORDER — OXYCODONE HCL 10 MG PO TABS: 10.0000 mg | ORAL_TABLET | Freq: Four times a day (QID) | ORAL | 0 refills | Status: DC | PRN

## 2021-11-10 MED ORDER — DULOXETINE HCL 30 MG PO CPEP
30.0000 mg | ORAL_CAPSULE | Freq: Every day | ORAL | 3 refills | Status: DC
Start: 2021-11-10 — End: 2021-11-20

## 2021-11-10 NOTE — Progress Notes (Signed)
Occupational Therapy Treatment Patient Details Name: Sylvia [name] MRN: [id number] DOB: 1956-03-22 Today's Date: 11/10/2021   History of present illness Pt is a 66 y/o F who failed multiple attemtps at revascularization of LLE. 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   OT comments  [name] displayed much better balance and fxl mobility than during yesterday's OT session. Today she was able to perform transfers, ambulation, toileting, grooming, dressing, all with SUPV for safety. Given her surgery and hospital stay, pt has generalized weakness and reduced endurance at present, and she was clearly tired by end of session, but she kept her attention on safety and was able to maintain fair-good standing balance. She endorsed 6/10 pain, much improved from yesterday's 10/10. Pt reports she feels able to return home today, with HHOT and HHPT on board.   Recommendations for follow up therapy are one component of a multi-disciplinary discharge planning process, led by the attending physician.  Recommendations may be updated based on patient status, additional functional criteria and insurance authorization.    Follow Up Recommendations  Home health OT    Assistance Recommended at Discharge Intermittent Supervision/Assistance  Patient can return home with the following  A little help with walking and/or transfers;A little help with bathing/dressing/bathroom;Assistance with cooking/housework;Assist for transportation;Help with stairs or ramp for entrance   Equipment Recommendations  None recommended by OT    Recommendations for Other Services      Precautions / Restrictions Precautions Precautions: Fall Precaution Comments: R BKA with prosthesis Required Braces or Orthoses: Knee Immobilizer - Left Restrictions Weight Bearing Restrictions: Yes RLE Weight Bearing: Weight bearing as tolerated LLE Weight Bearing: Non weight bearing Other Position/Activity Restrictions: BKA positioning  protocol given to pt       Mobility Bed Mobility               General bed mobility comments: pt up in chair and returned to chair    Transfers Overall transfer level: Needs assistance Equipment used: Rolling walker (2 wheels) Transfers: Sit to/from Stand, Bed to chair/wheelchair/BSC Sit to Stand: Supervision Stand pivot transfers: Supervision   Step pivot transfers: Supervision     General transfer comment: Pt able to come into standing from low recliner with RW and SUPV for safety, no physical assistance required     Balance Overall balance assessment: Needs assistance Sitting-balance support: Feet unsupported Sitting balance-Leahy Scale: Good     Standing balance support: Reliant on assistive device for balance, Bilateral upper extremity supported, During functional activity Standing balance-Leahy Scale: Fair Standing balance comment: R prosthesis donned                           ADL either performed or assessed with clinical judgement   ADL Overall ADL's : Needs assistance/impaired Eating/Feeding: Independent   Grooming: Wash/dry hands;Standing;Supervision/safety               Lower Body Dressing: Supervision/safety;Sitting/lateral leans   Toilet Transfer: Modified Independent;Grab bars;Comfort height toilet;Ambulation;Rolling walker (2 wheels)   Toileting- Clothing Manipulation and Hygiene: Modified independent;Sitting/lateral lean              Extremity/Trunk Assessment Upper Extremity Assessment Upper Extremity Assessment: Overall WFL for tasks assessed   Lower Extremity Assessment RLE Deficits / Details: hx of R BKA LLE Deficits / Details: L BKA, pain with ROM at knee; dressing on distal end of residual limb        Vision  Perception     Praxis      Cognition Arousal/Alertness: Awake/alert Behavior During Therapy: WFL for tasks assessed/performed Overall Cognitive Status: Within Functional Limits for tasks  assessed                                 General Comments: pleasant and engaged        Exercises Other Exercises Other Exercises: Educ re: falls prevention, home modifications, pain mgmt, LB dressing, safe use of RW    Shoulder Instructions       General Comments Pt given handout with pictures showing how to properly wrap L BKA with ace wrap. Reviewed with pt with fair understanding.    Pertinent Vitals/ Pain       Pain Assessment Pain Score: 6  Pain Intervention(s): Limited activity within patient's tolerance, Repositioned, RN gave pain meds during session  Home Living                                          Prior Functioning/Environment              Frequency  Min 2X/week        Progress Toward Goals  OT Goals(current goals can now be found in the care plan section)  Progress towards OT goals: Progressing toward goals  Acute Rehab OT Goals OT Goal Formulation: With patient Time For Goal Achievement: 11/20/21 Potential to Achieve Goals: Good  Plan Discharge plan remains appropriate;Frequency remains appropriate    Co-evaluation                 AM-PAC OT "6 Clicks" Daily Activity     Outcome Measure   Help from another person eating meals?: None Help from another person taking care of personal grooming?: A Little Help from another person toileting, which includes using toliet, bedpan, or urinal?: A Little Help from another person bathing (including washing, rinsing, drying)?: A Little Help from another person to put on and taking off regular upper body clothing?: None Help from another person to put on and taking off regular lower body clothing?: A Little 6 Click Score: 20    End of Session Equipment Utilized During Treatment: Rolling walker (2 wheels)  OT Visit Diagnosis: Other abnormalities of gait and mobility (R26.89) Pain - Right/Left: Left Pain - part of body: Leg   Activity Tolerance Patient tolerated  treatment well   Patient Left in chair;with call bell/phone within reach   Nurse Communication Mobility status        Time: 1323-1350 OT Time Calculation (min): 27 min  Charges: OT General Charges $OT Visit: 1 Visit OT Treatments $Self Care/Home Management : 23-37 mins Latina Craver, PhD, MS, OTR/L 11/10/21, 2:01 PM

## 2021-11-10 NOTE — Progress Notes (Signed)
Physical Therapy Treatment Patient Details Name: Sylvia [name] MRN: [id number] DOB: Sep 17, 1955 Today's Date: 11/10/2021   History of Present Illness Pt is a 65 y/o F who failed multiple attemtps at revascularization of LLE. 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    PT Comments    Pt seen this am, received in recliner, appears more alert and less fatigued today. Pt demonstrated good technique with sit<>stand transfers requiring increased help raising from low recliner. Pt has family assist at home and a gait belt to prevent LOB during mobility. Good tolerance for gait training 42ft x 2 with RW, R prosthesis, and CGA. Pt states she has a w/c at home and a ramp to enter. Pt states she feels comfortable with returning home this date. Also provided handout and demonstrated proper technique to apply ace wrap to L residual leg. Pt with good understanding.   Recommendations for follow up therapy are one component of a multi-disciplinary discharge planning process, led by the attending physician.  Recommendations may be updated based on patient status, additional functional criteria and insurance authorization.  Follow Up Recommendations  Home health PT Can patient physically be transported by private vehicle: Yes   Assistance Recommended at Discharge Intermittent Supervision/Assistance  Patient can return home with the following A little help with walking and/or transfers;A little help with bathing/dressing/bathroom;Help with stairs or ramp for entrance;Assist for transportation;Assistance with cooking/housework   Equipment Recommendations   (Pt states she has a w/c at home)    Recommendations for Other Services       Precautions / Restrictions Precautions Precautions: Fall Precaution Comments: R BKA with prosthesis Required Braces or Orthoses: Knee Immobilizer - Left Restrictions RLE Weight Bearing: Weight bearing as tolerated (with prosthesis) LLE Weight Bearing: Non weight  bearing Other Position/Activity Restrictions: BKA positioning protocol given to pt     Mobility  Bed Mobility               General bed mobility comments: pt up in chair and returned to chair    Transfers Overall transfer level: Needs assistance Equipment used: Rolling walker (2 wheels) Transfers: Sit to/from Stand Sit to Stand: Min assist, Mod assist (increased reliance on upper body for transfer)           General transfer comment: Pt required Mod/MinA to raise from low recliner.    Ambulation/Gait Ambulation/Gait assistance: Min guard Gait Distance (Feet): 20 Feet Assistive device: Rolling walker (2 wheels) Gait Pattern/deviations: Decreased step length - right (Hop to with R LE prosthesis) Gait velocity: decreased     General Gait Details: Able to change directions and complete 90 degree changes in direction without LOB   Stairs             Wheelchair Mobility    Modified Rankin (Stroke Patients Only)       Balance Overall balance assessment: Needs assistance Sitting-balance support: Feet unsupported Sitting balance-Leahy Scale: Good     Standing balance support: Reliant on assistive device for balance, Bilateral upper extremity supported, During functional activity Standing balance-Leahy Scale: Poor Standing balance comment: R prosthesis donned                            Cognition Arousal/Alertness: Awake/alert Behavior During Therapy: WFL for tasks assessed/performed Overall Cognitive Status: Within Functional Limits for tasks assessed  Exercises Amputee Exercises Quad Sets: AROM, Both, 10 reps Gluteal Sets: AROM, Both, 10 reps Knee Flexion: AROM, Right, 10 reps, Seated Chair Push Up: AROM, 10 reps, Seated    General Comments General comments (skin integrity, edema, etc.): Pt given handout with pictures showing how to properly wrap L BKA with ace wrap. Reviewed  with pt with fair understanding.      Pertinent Vitals/Pain Pain Assessment Pain Assessment: 0-10 Pain Score: 7  Pain Location: L residual limb Pain Descriptors / Indicators: Aching, Grimacing, Guarding Pain Intervention(s): Monitored during session, Patient requesting pain meds-RN notified    Home Living                          Prior Function            PT Goals (current goals can now be found in the care plan section) Acute Rehab PT Goals Patient Stated Goal: decrease pain Progress towards PT goals: Progressing toward goals    Frequency    7X/week      PT Plan Current plan remains appropriate    Co-evaluation              AM-PAC PT "6 Clicks" Mobility   Outcome Measure  Help needed turning from your back to your side while in a flat bed without using bedrails?: None Help needed moving from lying on your back to sitting on the side of a flat bed without using bedrails?: None Help needed moving to and from a bed to a chair (including a wheelchair)?: A Little Help needed standing up from a chair using your arms (e.g., wheelchair or bedside chair)?: A Little Help needed to walk in hospital room?: A Little Help needed climbing 3-5 steps with a railing? : A Lot 6 Click Score: 19    End of Session Equipment Utilized During Treatment: Gait belt Activity Tolerance: Patient tolerated treatment well Patient left: in chair;with call bell/phone within reach Nurse Communication: Mobility status;Patient requests pain meds PT Visit Diagnosis: Other abnormalities of gait and mobility (R26.89);Muscle weakness (generalized) (M62.81);Pain Pain - Right/Left: Left Pain - part of body: Leg     Time: 1030-1100 PT Time Calculation (min) (ACUTE ONLY): 30 min  Charges:  $Gait Training: 8-22 mins $Therapeutic Exercise: 8-22 mins                    Zadie Cleverly, PTA    Jannet Askew 11/10/2021, 12:11 PM

## 2021-11-10 NOTE — Plan of Care (Signed)
Problem: Education: Goal: Ability to describe self-care measures that may prevent or decrease complications (Diabetes Survival Skills Education) will improve 11/10/2021 1432 by Berneta Sages, RN Outcome: Adequate for Discharge 11/10/2021 1431 by Berneta Sages, RN Outcome: Progressing Goal: Individualized Educational Video(s) 11/10/2021 1432 by Berneta Sages, RN Outcome: Adequate for Discharge 11/10/2021 1431 by Berneta Sages, RN Outcome: Progressing   Problem: Coping: Goal: Ability to adjust to condition or change in health will improve 11/10/2021 1432 by Berneta Sages, RN Outcome: Adequate for Discharge 11/10/2021 1431 by Berneta Sages, RN Outcome: Progressing   Problem: Fluid Volume: Goal: Ability to maintain a balanced intake and output will improve 11/10/2021 1432 by Berneta Sages, RN Outcome: Adequate for Discharge 11/10/2021 1431 by Berneta Sages, RN Outcome: Progressing   Problem: Health Behavior/Discharge Planning: Goal: Ability to identify and utilize available resources and services will improve 11/10/2021 1432 by Berneta Sages, RN Outcome: Adequate for Discharge 11/10/2021 1431 by Berneta Sages, RN Outcome: Progressing Goal: Ability to manage health-related needs will improve 11/10/2021 1432 by Berneta Sages, RN Outcome: Adequate for Discharge 11/10/2021 1431 by Berneta Sages, RN Outcome: Progressing   Problem: Metabolic: Goal: Ability to maintain appropriate glucose levels will improve 11/10/2021 1432 by Berneta Sages, RN Outcome: Adequate for Discharge 11/10/2021 1431 by Berneta Sages, RN Outcome: Progressing   Problem: Nutritional: Goal: Maintenance of adequate nutrition will improve 11/10/2021 1432 by Berneta Sages, RN Outcome: Adequate for Discharge 11/10/2021 1431 by Berneta Sages, RN Outcome: Progressing Goal: Progress toward achieving an optimal weight will  improve 11/10/2021 1432 by Berneta Sages, RN Outcome: Adequate for Discharge 11/10/2021 1431 by Berneta Sages, RN Outcome: Progressing   Problem: Skin Integrity: Goal: Risk for impaired skin integrity will decrease 11/10/2021 1432 by Berneta Sages, RN Outcome: Adequate for Discharge 11/10/2021 1431 by Berneta Sages, RN Outcome: Progressing   Problem: Tissue Perfusion: Goal: Adequacy of tissue perfusion will improve 11/10/2021 1432 by Berneta Sages, RN Outcome: Adequate for Discharge 11/10/2021 1431 by Berneta Sages, RN Outcome: Progressing   Problem: Education: Goal: Knowledge of the prescribed therapeutic regimen will improve 11/10/2021 1432 by Berneta Sages, RN Outcome: Adequate for Discharge 11/10/2021 1431 by Berneta Sages, RN Outcome: Progressing Goal: Ability to verbalize activity precautions or restrictions will improve 11/10/2021 1432 by Berneta Sages, RN Outcome: Adequate for Discharge 11/10/2021 1431 by Berneta Sages, RN Outcome: Progressing Goal: Understanding of discharge needs will improve 11/10/2021 1432 by Berneta Sages, RN Outcome: Adequate for Discharge 11/10/2021 1431 by Berneta Sages, RN Outcome: Progressing   Problem: Activity: Goal: Ability to perform//tolerate increased activity and mobilize with assistive devices will improve 11/10/2021 1432 by Berneta Sages, RN Outcome: Adequate for Discharge 11/10/2021 1431 by Berneta Sages, RN Outcome: Progressing   Problem: Clinical Measurements: Goal: Postoperative complications will be avoided or minimized 11/10/2021 1432 by Berneta Sages, RN Outcome: Adequate for Discharge 11/10/2021 1431 by Berneta Sages, RN Outcome: Progressing   Problem: Self-Care: Goal: Ability to meet self-care needs will improve 11/10/2021 1432 by Berneta Sages, RN Outcome: Adequate for Discharge 11/10/2021 1431 by Berneta Sages,  RN Outcome: Progressing   Problem: Self-Concept: Goal: Ability to maintain and perform role responsibilities to the fullest extent possible will improve 11/10/2021 1432 by Berneta Sages, RN Outcome: Adequate for Discharge 11/10/2021 1431 by Berneta Sages, RN Outcome: Progressing   Problem: Pain Management: Goal: Pain level will decrease with appropriate interventions 11/10/2021 1432 by Berneta Sages, RN Outcome: Adequate for Discharge 11/10/2021 1431 by Berneta Sages, RN Outcome: Progressing   Problem: Skin Integrity: Goal: Demonstration of wound healing without infection  will improve 11/10/2021 1432 by Berneta Sages, RN Outcome: Adequate for Discharge 11/10/2021 1431 by Berneta Sages, RN Outcome: Progressing   Problem: Education: Goal: Knowledge of General Education information will improve Description: Including pain rating scale, medication(s)/side effects and non-pharmacologic comfort measures 11/10/2021 1432 by Berneta Sages, RN Outcome: Adequate for Discharge 11/10/2021 1431 by Berneta Sages, RN Outcome: Progressing   Problem: Health Behavior/Discharge Planning: Goal: Ability to manage health-related needs will improve 11/10/2021 1432 by Berneta Sages, RN Outcome: Adequate for Discharge 11/10/2021 1431 by Berneta Sages, RN Outcome: Progressing   Problem: Clinical Measurements: Goal: Ability to maintain clinical measurements within normal limits will improve 11/10/2021 1432 by Berneta Sages, RN Outcome: Adequate for Discharge 11/10/2021 1431 by Berneta Sages, RN Outcome: Progressing Goal: Will remain free from infection 11/10/2021 1432 by Berneta Sages, RN Outcome: Adequate for Discharge 11/10/2021 1431 by Berneta Sages, RN Outcome: Progressing Goal: Diagnostic test results will improve 11/10/2021 1432 by Berneta Sages, RN Outcome: Adequate for Discharge 11/10/2021 1431 by Berneta Sages, RN Outcome: Progressing Goal: Respiratory complications will improve 11/10/2021 1432 by Berneta Sages, RN Outcome: Adequate for Discharge 11/10/2021 1431 by Berneta Sages, RN Outcome: Progressing Goal: Cardiovascular complication will be avoided 11/10/2021 1432 by Berneta Sages, RN Outcome: Adequate for Discharge 11/10/2021 1431 by Berneta Sages, RN Outcome: Progressing   Problem: Activity: Goal: Risk for activity intolerance will decrease 11/10/2021 1432 by Berneta Sages, RN Outcome: Adequate for Discharge 11/10/2021 1431 by Berneta Sages, RN Outcome: Progressing   Problem: Nutrition: Goal: Adequate nutrition will be maintained 11/10/2021 1432 by Berneta Sages, RN Outcome: Adequate for Discharge 11/10/2021 1431 by Berneta Sages, RN Outcome: Progressing   Problem: Coping: Goal: Level of anxiety will decrease 11/10/2021 1432 by Berneta Sages, RN Outcome: Adequate for Discharge 11/10/2021 1431 by Berneta Sages, RN Outcome: Progressing   Problem: Elimination: Goal: Will not experience complications related to bowel motility 11/10/2021 1432 by Berneta Sages, RN Outcome: Adequate for Discharge 11/10/2021 1431 by Berneta Sages, RN Outcome: Progressing Goal: Will not experience complications related to urinary retention 11/10/2021 1432 by Berneta Sages, RN Outcome: Adequate for Discharge 11/10/2021 1431 by Berneta Sages, RN Outcome: Progressing   Problem: Pain Managment: Goal: General experience of comfort will improve 11/10/2021 1432 by Berneta Sages, RN Outcome: Adequate for Discharge 11/10/2021 1431 by Berneta Sages, RN Outcome: Progressing   Problem: Safety: Goal: Ability to remain free from injury will improve 11/10/2021 1432 by Berneta Sages, RN Outcome: Adequate for Discharge 11/10/2021 1431 by Berneta Sages, RN Outcome: Progressing   Problem: Skin  Integrity: Goal: Risk for impaired skin integrity will decrease 11/10/2021 1432 by Berneta Sages, RN Outcome: Adequate for Discharge 11/10/2021 1431 by Berneta Sages, RN Outcome: Progressing

## 2021-11-10 NOTE — Plan of Care (Signed)
  Problem: Education: Goal: Ability to describe self-care measures that may prevent or decrease complications (Diabetes Survival Skills Education) will improve Outcome: Progressing Goal: Individualized Educational Video(s) Outcome: Progressing   Problem: Coping: Goal: Ability to adjust to condition or change in health will improve Outcome: Progressing   Problem: Fluid Volume: Goal: Ability to maintain a balanced intake and output will improve Outcome: Progressing   Problem: Health Behavior/Discharge Planning: Goal: Ability to identify and utilize available resources and services will improve Outcome: Progressing Goal: Ability to manage health-related needs will improve Outcome: Progressing   Problem: Metabolic: Goal: Ability to maintain appropriate glucose levels will improve Outcome: Progressing   Problem: Nutritional: Goal: Maintenance of adequate nutrition will improve Outcome: Progressing Goal: Progress toward achieving an optimal weight will improve Outcome: Progressing   Problem: Skin Integrity: Goal: Risk for impaired skin integrity will decrease Outcome: Progressing   Problem: Tissue Perfusion: Goal: Adequacy of tissue perfusion will improve Outcome: Progressing   Problem: Education: Goal: Knowledge of the prescribed therapeutic regimen will improve Outcome: Progressing Goal: Ability to verbalize activity precautions or restrictions will improve Outcome: Progressing Goal: Understanding of discharge needs will improve Outcome: Progressing   Problem: Activity: Goal: Ability to perform//tolerate increased activity and mobilize with assistive devices will improve Outcome: Progressing   Problem: Clinical Measurements: Goal: Postoperative complications will be avoided or minimized Outcome: Progressing   Problem: Self-Care: Goal: Ability to meet self-care needs will improve Outcome: Progressing   Problem: Self-Concept: Goal: Ability to maintain and perform  role responsibilities to the fullest extent possible will improve Outcome: Progressing   Problem: Pain Management: Goal: Pain level will decrease with appropriate interventions Outcome: Progressing   Problem: Skin Integrity: Goal: Demonstration of wound healing without infection will improve Outcome: Progressing   Problem: Education: Goal: Knowledge of General Education information will improve Description: Including pain rating scale, medication(s)/side effects and non-pharmacologic comfort measures Outcome: Progressing   Problem: Health Behavior/Discharge Planning: Goal: Ability to manage health-related needs will improve Outcome: Progressing   Problem: Clinical Measurements: Goal: Ability to maintain clinical measurements within normal limits will improve Outcome: Progressing Goal: Will remain free from infection Outcome: Progressing Goal: Diagnostic test results will improve Outcome: Progressing Goal: Respiratory complications will improve Outcome: Progressing Goal: Cardiovascular complication will be avoided Outcome: Progressing   Problem: Activity: Goal: Risk for activity intolerance will decrease Outcome: Progressing   Problem: Nutrition: Goal: Adequate nutrition will be maintained Outcome: Progressing   Problem: Coping: Goal: Level of anxiety will decrease Outcome: Progressing   Problem: Elimination: Goal: Will not experience complications related to bowel motility Outcome: Progressing Goal: Will not experience complications related to urinary retention Outcome: Progressing   Problem: Pain Managment: Goal: General experience of comfort will improve Outcome: Progressing   Problem: Safety: Goal: Ability to remain free from injury will improve Outcome: Progressing   Problem: Skin Integrity: Goal: Risk for impaired skin integrity will decrease Outcome: Progressing   

## 2021-11-10 NOTE — Plan of Care (Signed)
  Problem: Coping: Goal: Ability to adjust to condition or change in health will improve Outcome: Progressing   Problem: Fluid Volume: Goal: Ability to maintain a balanced intake and output will improve Outcome: Progressing   Problem: Health Behavior/Discharge Planning: Goal: Ability to identify and utilize available resources and services will improve Outcome: Progressing   Problem: Health Behavior/Discharge Planning: Goal: Ability to manage health-related needs will improve Outcome: Progressing   Problem: Metabolic: Goal: Ability to maintain appropriate glucose levels will improve Outcome: Progressing   Problem: Nutritional: Goal: Maintenance of adequate nutrition will improve Outcome: Progressing   Problem: Nutritional: Goal: Progress toward achieving an optimal weight will improve Outcome: Progressing   Problem: Skin Integrity: Goal: Risk for impaired skin integrity will decrease Outcome: Progressing   Problem: Tissue Perfusion: Goal: Adequacy of tissue perfusion will improve Outcome: Progressing   Problem: Education: Goal: Knowledge of the prescribed therapeutic regimen will improve Outcome: Progressing   Problem: Education: Goal: Ability to verbalize activity precautions or restrictions will improve Outcome: Progressing   Problem: Education: Goal: Understanding of discharge needs will improve Outcome: Progressing   Problem: Activity: Goal: Ability to perform//tolerate increased activity and mobilize with assistive devices will improve Outcome: Progressing   Problem: Clinical Measurements: Goal: Postoperative complications will be avoided or minimized Outcome: Progressing   Problem: Self-Care: Goal: Ability to meet self-care needs will improve Outcome: Progressing   Problem: Self-Concept: Goal: Ability to maintain and perform role responsibilities to the fullest extent possible will improve Outcome: Progressing   Problem: Pain Management: Goal:  Pain level will decrease with appropriate interventions Outcome: Progressing   Problem: Skin Integrity: Goal: Demonstration of wound healing without infection will improve Outcome: Progressing   Problem: Education: Goal: Knowledge of General Education information will improve Description: Including pain rating scale, medication(s)/side effects and non-pharmacologic comfort measures Outcome: Progressing   Problem: Health Behavior/Discharge Planning: Goal: Ability to manage health-related needs will improve Outcome: Progressing   Problem: Clinical Measurements: Goal: Will remain free from infection Outcome: Progressing   Problem: Clinical Measurements: Goal: Diagnostic test results will improve Outcome: Progressing   Problem: Clinical Measurements: Goal: Respiratory complications will improve Outcome: Progressing   Problem: Clinical Measurements: Goal: Cardiovascular complication will be avoided Outcome: Progressing   Problem: Activity: Goal: Risk for activity intolerance will decrease Outcome: Progressing   Problem: Nutrition: Goal: Adequate nutrition will be maintained Outcome: Progressing   Problem: Coping: Goal: Level of anxiety will decrease Outcome: Progressing   Problem: Elimination: Goal: Will not experience complications related to bowel motility Outcome: Progressing   Problem: Elimination: Goal: Will not experience complications related to urinary retention Outcome: Progressing   Problem: Safety: Goal: Ability to remain free from injury will improve Outcome: Progressing   Problem: Skin Integrity: Goal: Risk for impaired skin integrity will decrease Outcome: Progressing

## 2021-11-12 ENCOUNTER — Ambulatory Visit: Payer: Medicare Other | Admitting: Gastroenterology

## 2021-11-20 ENCOUNTER — Ambulatory Visit (INDEPENDENT_AMBULATORY_CARE_PROVIDER_SITE_OTHER): Payer: Medicare Other | Admitting: Nurse Practitioner

## 2021-11-20 ENCOUNTER — Telehealth (INDEPENDENT_AMBULATORY_CARE_PROVIDER_SITE_OTHER): Payer: Self-pay

## 2021-11-20 ENCOUNTER — Other Ambulatory Visit (INDEPENDENT_AMBULATORY_CARE_PROVIDER_SITE_OTHER): Payer: Self-pay | Admitting: Nurse Practitioner

## 2021-11-20 ENCOUNTER — Encounter (INDEPENDENT_AMBULATORY_CARE_PROVIDER_SITE_OTHER): Payer: Self-pay

## 2021-11-20 VITALS — BP 130/78 | HR 116

## 2021-11-20 DIAGNOSIS — I70222 Atherosclerosis of native arteries of extremities with rest pain, left leg: Secondary | ICD-10-CM

## 2021-11-20 MED ORDER — SULFAMETHOXAZOLE-TRIMETHOPRIM 800-160 MG PO TABS: 1.0000 | ORAL_TABLET | Freq: Two times a day (BID) | ORAL | 0 refills | Status: DC

## 2021-11-20 MED ORDER — DULOXETINE HCL 60 MG PO CPEP: 60.0000 mg | ORAL_CAPSULE | Freq: Every day | ORAL | 3 refills | Status: AC

## 2021-11-20 MED ORDER — OXYCODONE HCL 15 MG PO TABS: 15.0000 mg | ORAL_TABLET | Freq: Four times a day (QID) | ORAL | 0 refills | Status: DC | PRN

## 2021-11-20 NOTE — Progress Notes (Signed)
Pt came in for wound evaluation. Wound was cleaned and dressed with zeroform, curlex, and an ace wrap.  Pt will follow up as before.

## 2021-11-20 NOTE — Telephone Encounter (Signed)
Pt is requesting a refill on her Oxycodone.  Please advise

## 2021-11-20 NOTE — Telephone Encounter (Signed)
We will discuss when she is in office today

## 2021-11-25 LAB — AEROBIC CULTURE

## 2021-11-30 ENCOUNTER — Other Ambulatory Visit (INDEPENDENT_AMBULATORY_CARE_PROVIDER_SITE_OTHER): Payer: Self-pay | Admitting: Nurse Practitioner

## 2021-11-30 ENCOUNTER — Ambulatory Visit (INDEPENDENT_AMBULATORY_CARE_PROVIDER_SITE_OTHER): Payer: Medicare Other | Admitting: Nurse Practitioner

## 2021-11-30 ENCOUNTER — Inpatient Hospital Stay: Payer: Medicare Other | Attending: Oncology

## 2021-11-30 ENCOUNTER — Other Ambulatory Visit: Payer: Self-pay

## 2021-11-30 ENCOUNTER — Encounter (INDEPENDENT_AMBULATORY_CARE_PROVIDER_SITE_OTHER): Payer: Self-pay | Admitting: Nurse Practitioner

## 2021-11-30 VITALS — BP 135/84 | HR 87 | Resp 16

## 2021-11-30 DIAGNOSIS — Z8711 Personal history of peptic ulcer disease: Secondary | ICD-10-CM | POA: Diagnosis not present

## 2021-11-30 DIAGNOSIS — Z7901 Long term (current) use of anticoagulants: Secondary | ICD-10-CM | POA: Diagnosis not present

## 2021-11-30 DIAGNOSIS — Z89511 Acquired absence of right leg below knee: Secondary | ICD-10-CM | POA: Insufficient documentation

## 2021-11-30 DIAGNOSIS — Z87891 Personal history of nicotine dependence: Secondary | ICD-10-CM | POA: Diagnosis not present

## 2021-11-30 DIAGNOSIS — Z7982 Long term (current) use of aspirin: Secondary | ICD-10-CM | POA: Diagnosis not present

## 2021-11-30 DIAGNOSIS — D638 Anemia in other chronic diseases classified elsewhere: Secondary | ICD-10-CM

## 2021-11-30 DIAGNOSIS — Z79899 Other long term (current) drug therapy: Secondary | ICD-10-CM | POA: Insufficient documentation

## 2021-11-30 DIAGNOSIS — D649 Anemia, unspecified: Secondary | ICD-10-CM | POA: Insufficient documentation

## 2021-11-30 DIAGNOSIS — Z89512 Acquired absence of left leg below knee: Secondary | ICD-10-CM

## 2021-11-30 LAB — IRON AND TIBC
Iron: 41 ug/dL (ref 28–170)
Saturation Ratios: 15 % (ref 10.4–31.8)
TIBC: 277 ug/dL (ref 250–450)
UIBC: 236 ug/dL

## 2021-11-30 LAB — COMPREHENSIVE METABOLIC PANEL
ALT: 11 U/L (ref 0–44)
AST: 14 U/L — ABNORMAL LOW (ref 15–41)
Albumin: 3.5 g/dL (ref 3.5–5.0)
Alkaline Phosphatase: 123 U/L (ref 38–126)
Anion gap: 14 (ref 5–15)
BUN: 25 mg/dL — ABNORMAL HIGH (ref 8–23)
CO2: 21 mmol/L — ABNORMAL LOW (ref 22–32)
Calcium: 9.8 mg/dL (ref 8.9–10.3)
Chloride: 98 mmol/L (ref 98–111)
Creatinine, Ser: 0.97 mg/dL (ref 0.44–1.00)
GFR, Estimated: >60 mL/min (ref >60)
Glucose, Bld: 322 mg/dL — ABNORMAL HIGH (ref 70–99)
Potassium: 4.1 mmol/L (ref 3.5–5.1)
Sodium: 133 mmol/L — ABNORMAL LOW (ref 135–145)
Total Bilirubin: 0.3 mg/dL (ref 0.3–1.2)
Total Protein: 8.4 g/dL — ABNORMAL HIGH (ref 6.5–8.1)

## 2021-11-30 LAB — CBC WITH DIFFERENTIAL/PLATELET
Abs Immature Granulocytes: 0.03 10*3/uL (ref 0.00–0.07)
Basophils Absolute: 0.1 10*3/uL (ref 0.0–0.1)
Basophils Relative: 1 %
Eosinophils Absolute: 0.2 10*3/uL (ref 0.0–0.5)
Eosinophils Relative: 2 %
HCT: 30 % — ABNORMAL LOW (ref 36.0–46.0)
Hemoglobin: 10 g/dL — ABNORMAL LOW (ref 12.0–15.0)
Immature Granulocytes: 0 %
Lymphocytes Relative: 25 %
Lymphs Abs: 2.1 10*3/uL (ref 0.7–4.0)
MCH: 27 pg (ref 26.0–34.0)
MCHC: 33.3 g/dL (ref 30.0–36.0)
MCV: 81.1 fL (ref 80.0–100.0)
Monocytes Absolute: 0.5 10*3/uL (ref 0.1–1.0)
Monocytes Relative: 6 %
Neutro Abs: 5.5 10*3/uL (ref 1.7–7.7)
Neutrophils Relative %: 66 %
Platelets: 486 10*3/uL — ABNORMAL HIGH (ref 150–400)
RBC: 3.7 MIL/uL — ABNORMAL LOW (ref 3.87–5.11)
RDW: 14.5 % (ref 11.5–15.5)
WBC: 8.5 10*3/uL (ref 4.0–10.5)
nRBC: 0 % (ref 0.0–0.2)

## 2021-11-30 LAB — FERRITIN: Ferritin: 129 ng/mL (ref 11–307)

## 2021-11-30 MED ORDER — GABAPENTIN 300 MG PO CAPS: ORAL_CAPSULE | ORAL | 3 refills | Status: AC

## 2021-12-02 ENCOUNTER — Telehealth (INDEPENDENT_AMBULATORY_CARE_PROVIDER_SITE_OTHER): Payer: Self-pay

## 2021-12-02 ENCOUNTER — Inpatient Hospital Stay (HOSPITAL_BASED_OUTPATIENT_CLINIC_OR_DEPARTMENT_OTHER): Payer: Medicare Other | Admitting: Oncology

## 2021-12-02 ENCOUNTER — Encounter: Payer: Self-pay | Admitting: Oncology

## 2021-12-02 VITALS — BP 124/83 | HR 111 | Temp 96.1°F | Resp 18 | Wt 121.3 lb

## 2021-12-02 DIAGNOSIS — Z7901 Long term (current) use of anticoagulants: Secondary | ICD-10-CM

## 2021-12-02 DIAGNOSIS — D638 Anemia in other chronic diseases classified elsewhere: Secondary | ICD-10-CM

## 2021-12-02 DIAGNOSIS — S88119A Complete traumatic amputation at level between knee and ankle, unspecified lower leg, initial encounter: Secondary | ICD-10-CM

## 2021-12-02 DIAGNOSIS — Z87891 Personal history of nicotine dependence: Secondary | ICD-10-CM

## 2021-12-02 DIAGNOSIS — Z7982 Long term (current) use of aspirin: Secondary | ICD-10-CM

## 2021-12-02 DIAGNOSIS — Z89511 Acquired absence of right leg below knee: Secondary | ICD-10-CM | POA: Diagnosis not present

## 2021-12-02 DIAGNOSIS — Z8711 Personal history of peptic ulcer disease: Secondary | ICD-10-CM | POA: Diagnosis not present

## 2021-12-02 DIAGNOSIS — R739 Hyperglycemia, unspecified: Secondary | ICD-10-CM

## 2021-12-02 DIAGNOSIS — D649 Anemia, unspecified: Secondary | ICD-10-CM | POA: Diagnosis not present

## 2021-12-02 DIAGNOSIS — Z79899 Other long term (current) drug therapy: Secondary | ICD-10-CM

## 2021-12-02 LAB — AEROBIC CULTURE

## 2021-12-02 NOTE — Progress Notes (Signed)
Hematology/Oncology  note [facility] Telephone:([phone] [phone]   Patient Care Team: Center, [facility] as PCP - General (General Practice) Rickard Patience, MD as Consulting Physician (Hematology and Oncology)  REFERRING PROVIDER: Center, [facility] CHIEF COMPLAINTS/REASON FOR VISIT:  Follow up of anemia  HISTORY OF PRESENTING ILLNESS:   Reviewed patient's recent labs  05/09/2019, hemoglobin 8.1, MCV 88, platelet 470. 04/16/2019, iron panel showed saturation 18, ferritin 28, TIBC 308 Patient has been started on oral iron supplementation NiFerrex 150 mg daily.  She reports tolerating well.  Reviewed patient's previous labs ordered by primary care physician's office, anemia is chronic onset , duration is since at least July 2017.  Patient's hemoglobin was about 9 back in September 2020.  She was admitted at that time due to severe PAD and right foot gangrene.  Status post right below-knee amputation.   She was admitted in December due to acute drop of hemoglobin to 6.  She has a history of GI bleed secondary to gastric ulcer.  Received PRBC transfusion.  Patient is on Eliquis 2.5 mg twice daily. No aggravating or improving factors.  Last endoscopy: 04/13/2019 EGD colonoscopy and small bowel capsule study during her hospitalization.  EGD was negative, 04/16/2019 colonoscopy with small polyps removed, 04/18/2019 small bowel capsule study did not reach this small bowel and was inconclusive.   History of recurrent acute GI bleeding/melena, history of PUD on chronic anticoagulation. 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    RA, follows up with rheumatology Dr.Patel. On MTX with folic acid and plaquenil.   INTERVAL HISTORY Sylvia [name] is a 66 y.o. female who has above history reviewed by me today presents for follow up visit for management of anemia 11/05/2021-11/09/2021, patient was hospitalized due to peripheral  artery disease, status post left below-knee amputation. Patient follows up with wound care. Patient is now on Eliquis 5 mg twice daily as well as aspirin 81 mg daily. Chronic fatigue, unchanged.  She had a PRBC transfusion during her hospitalization.  Review of Systems  Constitutional:  Positive for fatigue. Negative for appetite change, chills, fever and unexpected weight change.  HENT:   Negative for hearing loss and voice change.   Eyes:  Negative for eye problems.  Respiratory:  Negative for chest tightness and cough.   Cardiovascular:  Negative for chest pain.  Gastrointestinal:  Negative for abdominal distention, abdominal pain and blood in stool.  Endocrine: Negative for hot flashes.  Genitourinary:  Negative for difficulty urinating and frequency.   Musculoskeletal:  Negative for arthralgias.  Skin:  Negative for itching and rash.  Neurological:  Negative for extremity weakness.  Hematological:  Negative for adenopathy.  Psychiatric/Behavioral:  Negative for confusion.     MEDICAL HISTORY:  Past Medical History:  Diagnosis Date   Anemia of chronic disease 05/16/2019   Anxiety    h/o   Arthritis    Cocaine abuse (HCC)    COPD (chronic obstructive pulmonary disease) (HCC)    Depression    Diabetes mellitus without complication (HCC)    DKA (diabetic ketoacidosis) (HCC)    Elevated troponin    GERD (gastroesophageal reflux disease)    GI bleed    Hypertension    bp under control-off meds since 2019   Ischemic leg     SURGICAL HISTORY: Past Surgical History:  Procedure Laterality Date   ABDOMINAL HYSTERECTOMY     AMPUTATION Right 12/27/2018   Procedure: AMPUTATION BELOW KNEE;  Surgeon: Annice Needy,  MD;  Location: [facility];  Service: General;  Laterality: Right;   AMPUTATION Left 11/05/2021   Procedure: AMPUTATION BELOW KNEE;  Surgeon: Algernon Huxley, MD;  Location: [facility];  Service: Vascular;  Laterality: Left;   APPLICATION OF WOUND VAC  10/16/2021   Procedure:  APPLICATION OF WOUND VAC;  Surgeon: Algernon Huxley, MD;  Location: [facility];  Service: Vascular;;   CATARACT EXTRACTION W/PHACO Right 03/27/2020   Procedure: CATARACT EXTRACTION PHACO AND INTRAOCULAR LENS PLACEMENT (Stem) RIGHT DIABETIC 7.54 00:52.5;  Surgeon: Birder Robson, MD;  Location: [place];  Service: Ophthalmology;  Laterality: Right;   CATARACT EXTRACTION W/PHACO Left 05/20/2020   Procedure: CATARACT EXTRACTION PHACO AND INTRAOCULAR LENS PLACEMENT (IOC) LEFT DIABETIC 4.95 00:37.6;  Surgeon: Birder Robson, MD;  Location: [place];  Service: Ophthalmology;  Laterality: Left;  Diabetic - oral meds COVID + 04-24-20   CHOLECYSTECTOMY     COLONOSCOPY WITH PROPOFOL N/A 04/16/2019   Procedure: COLONOSCOPY WITH PROPOFOL;  Surgeon: Virgel Manifold, MD;  Location: [facility];  Service: Endoscopy;  Laterality: N/A;   COLONOSCOPY WITH PROPOFOL N/A 01/16/2020   Procedure: COLONOSCOPY WITH PROPOFOL;  Surgeon: Virgel Manifold, MD;  Location: [facility];  Service: Endoscopy;  Laterality: N/A;   ENDARTERECTOMY FEMORAL Left 10/16/2021   Procedure: ENDARTERECTOMY FEMORAL;  Surgeon: Algernon Huxley, MD;  Location: [facility];  Service: Vascular;  Laterality: Left;   ESOPHAGOGASTRODUODENOSCOPY (EGD) WITH PROPOFOL N/A 06/15/2018   Procedure: ESOPHAGOGASTRODUODENOSCOPY (EGD) WITH PROPOFOL;  Surgeon: [place], Benay Pike, MD;  Location: [facility];  Service: Gastroenterology;  Laterality: N/A;   ESOPHAGOGASTRODUODENOSCOPY (EGD) WITH PROPOFOL N/A 01/03/2019   Procedure: ESOPHAGOGASTRODUODENOSCOPY (EGD) WITH PROPOFOL;  Surgeon: Lucilla Lame, MD;  Location: [facility];  Service: Endoscopy;  Laterality: N/A;   ESOPHAGOGASTRODUODENOSCOPY (EGD) WITH PROPOFOL N/A 09/25/2021   Procedure: ESOPHAGOGASTRODUODENOSCOPY (EGD) WITH PROPOFOL;  Surgeon: Lucilla Lame, MD;  Location: [facility];  Service: Endoscopy;  Laterality: N/A;   GIVENS CAPSULE STUDY  04/16/2019   Procedure: GIVENS  CAPSULE STUDY;  Surgeon: Virgel Manifold, MD;  Location: [facility];  Service: Endoscopy;;   GIVENS CAPSULE STUDY N/A 06/25/2019   Procedure: GIVENS CAPSULE STUDY;  Surgeon: Jonathon Bellows, MD;  Location: [facility];  Service: Gastroenterology;  Laterality: N/A;   LOWER EXTREMITY ANGIOGRAPHY Right 08/21/2018   Procedure: LOWER EXTREMITY ANGIOGRAPHY;  Surgeon: Algernon Huxley, MD;  Location: [place] [facility];  Service: Cardiovascular;  Laterality: Right;   LOWER EXTREMITY ANGIOGRAPHY Left 08/28/2018   Procedure: LOWER EXTREMITY ANGIOGRAPHY;  Surgeon: Algernon Huxley, MD;  Location: [place] [facility];  Service: Cardiovascular;  Laterality: Left;   LOWER EXTREMITY ANGIOGRAPHY Right 08/28/2018   Procedure: Lower Extremity Angiography;  Surgeon: Algernon Huxley, MD;  Location: [place] [facility];  Service: Cardiovascular;  Laterality: Right;   LOWER EXTREMITY ANGIOGRAPHY Right 12/18/2018   Procedure: Lower Extremity Angiography;  Surgeon: Algernon Huxley, MD;  Location: [place] [facility];  Service: Cardiovascular;  Laterality: Right;   LOWER EXTREMITY ANGIOGRAPHY Right 12/18/2018   Procedure: Lower Extremity Angiography;  Surgeon: Algernon Huxley, MD;  Location: [place] [facility];  Service: Cardiovascular;  Laterality: Right;   LOWER EXTREMITY ANGIOGRAPHY Left 12/21/2018   Procedure: Lower Extremity Angiography;  Surgeon: Algernon Huxley, MD;  Location: [place] [facility];  Service: Cardiovascular;  Laterality: Left;   LOWER EXTREMITY ANGIOGRAPHY Right 12/21/2018   Procedure: Lower Extremity Angiography;  Surgeon: Algernon Huxley, MD;  Location: [place] [facility];  Service:  Cardiovascular;  Laterality: Right;   LOWER EXTREMITY ANGIOGRAPHY Left 12/25/2020   Procedure: LOWER EXTREMITY ANGIOGRAPHY;  Surgeon: Algernon Huxley, MD;  Location: [place] [facility];  Service: Cardiovascular;  Laterality: Left;   LOWER EXTREMITY ANGIOGRAPHY Left 09/21/2021   Procedure: Lower Extremity Angiography;  Surgeon: Algernon Huxley, MD;  Location: [place] [facility];  Service: Cardiovascular;  Laterality: Left;   LOWER EXTREMITY ANGIOGRAPHY Left 10/13/2021   Procedure: Lower Extremity Angiography;  Surgeon: Katha Cabal, MD;  Location: [place] [facility];  Service: Cardiovascular;  Laterality: Left;   LOWER EXTREMITY ANGIOGRAPHY Left 10/14/2021   Procedure: Lower Extremity Angiography;  Surgeon: Algernon Huxley, MD;  Location: [place] [facility];  Service: Cardiovascular;  Laterality: Left;   LOWER EXTREMITY ANGIOGRAPHY Left 11/02/2021   Procedure: Lower Extremity Angiography;  Surgeon: Algernon Huxley, MD;  Location: [place] [facility];  Service: Cardiovascular;  Laterality: Left;   LOWER EXTREMITY INTERVENTION N/A 12/22/2018   Procedure: LOWER EXTREMITY INTERVENTION;  Surgeon: Algernon Huxley, MD;  Location: [place] [facility];  Service: Cardiovascular;  Laterality: N/A;   LOWER EXTREMITY INTERVENTION Left 12/26/2020   Procedure: LOWER EXTREMITY INTERVENTION;  Surgeon: Algernon Huxley, MD;  Location: [place] [facility];  Service: Cardiovascular;  Laterality: Left;   LOWER EXTREMITY INTERVENTION N/A 09/22/2021   Procedure: LOWER EXTREMITY INTERVENTION;  Surgeon: Algernon Huxley, MD;  Location: [place] [facility];  Service: Cardiovascular;  Laterality: N/A;    SOCIAL HISTORY: Social History   Socioeconomic History   Marital status: Divorced    Spouse name: Not on file   Number of children: 2   Years of education: Not on file   Highest education level: Not on file  Occupational History   Not on file  Tobacco Use   Smoking status: Former    Packs/day: 0.25    Years: 45.00    Total pack years: 11.25    Types: Cigarettes   Smokeless tobacco: Never   Tobacco comments:    quit  Vaping Use   Vaping Use: Never used  Substance and Sexual Activity   Alcohol use: No   Drug use: Not Currently    Types: Cocaine    Comment: last used in April 2021 per patient-last 2 drug tests have been negative for cocaine   Sexual  activity: Not Currently  Other Topics Concern   Not on file  Social History Narrative   5 grandchildren and 5 great grandchildren    2 grandchildren live with Pt. (57 & 66 y.o.)   Social Determinants of Health   Financial Resource Strain: Low Risk  (06/14/2018)   Overall Financial Resource Strain (CARDIA)    Difficulty of Paying Living Expenses: Not hard at all  Food Insecurity: Food Insecurity Present (06/14/2018)   Hunger Vital Sign    Worried About [place] in the Last Year: Sometimes true    Ran Out of Food in the Last Year: Sometimes true  Transportation Needs: No Transportation Needs (06/14/2018)   PRAPARE - Hydrologist (Medical): No    Lack of Transportation (Non-Medical): No  Physical Activity: Insufficiently Active (06/14/2018)   Exercise Vital Sign    Days of Exercise per Week: 4 days    Minutes of Exercise per Session: 20 min  Stress: No Stress Concern Present (06/14/2018)   [place]    Feeling of Stress : Only a little  Social Connections: Unknown (06/14/2018)   Social Connection and Isolation Panel [NHANES]    Frequency of Communication with Friends and Family: Patient refused    Frequency of Social Gatherings with Friends and Family: Patient refused    Attends Religious Services: Patient refused    Active Member of Clubs or Organizations: Patient refused    Attends Banker Meetings: Patient refused    Marital Status: Patient refused  Intimate Partner Violence: Unknown (06/14/2018)   Humiliation, Afraid, Rape, and Kick questionnaire    Fear of Current or Ex-Partner: Patient refused    Emotionally Abused: Patient refused    Physically Abused: Patient refused    Sexually Abused: Patient refused    FAMILY HISTORY: Family History  Problem Relation Age of Onset   Breast cancer Mother 48    ALLERGIES:  is allergic to vancomycin, hydrocodone,  metformin and related, penicillins, and tramadol.  MEDICATIONS:  Current Outpatient Medications  Medication Sig Dispense Refill   acetaminophen (TYLENOL) 325 MG tablet Take 650 mg by mouth every 6 (six) hours as needed.     albuterol (PROVENTIL HFA;VENTOLIN HFA) 108 (90 Base) MCG/ACT inhaler Inhale 2 puffs into the lungs every 6 (six) hours as needed for wheezing or shortness of breath. 1 Inhaler 2   apixaban (ELIQUIS) 5 MG TABS tablet Take 1 tablet (5 mg total) by mouth 2 (two) times daily. 60 tablet 12   aspirin 81 MG chewable tablet Chew 1 tablet (81 mg total) by mouth daily. 30 tablet 11   atorvastatin (LIPITOR) 10 MG tablet Take 10 mg by mouth at bedtime.     buPROPion (ZYBAN) 150 MG 12 hr tablet Take 150 mg by mouth 2 (two) times daily.     cetirizine (ZYRTEC) 10 MG tablet Take 10 mg by mouth daily as needed for allergies.     docusate sodium (COLACE) 100 MG capsule Take 100 mg by mouth daily.     DULoxetine (CYMBALTA) 60 MG capsule Take 1 capsule (60 mg total) by mouth daily. 30 capsule 3   famotidine (PEPCID) 20 MG tablet Take 1 tablet (20 mg total) by mouth at bedtime. 30 tablet 3   ferrous sulfate 325 (65 FE) MG tablet Take 325 mg by mouth daily.      folic acid (FOLVITE) 1 MG tablet Take 1 mg by mouth daily.     gabapentin (NEURONTIN) 300 MG capsule TAKE 3 CAPSULES BY MOUTH IN THE  MORNING AND 3 CAPSULES BY MOUTH  IN THE EVENING 450 capsule 3   hydroxychloroquine (PLAQUENIL) 200 MG tablet Take 200 mg by mouth daily.     JARDIANCE 10 MG TABS tablet Take 10 mg by mouth every morning.     lisinopril (ZESTRIL) 10 MG tablet Take 1 tablet (10 mg total) by mouth daily. (Patient taking differently: Take 10 mg by mouth every morning.) 30 tablet 1   Multiple Vitamins-Minerals (WOMENS MULTIVITAMIN PO) Take 1 tablet by mouth daily.     ondansetron (ZOFRAN-ODT) 4 MG disintegrating tablet Take 1 tablet (4 mg total) by mouth every 8 (eight) hours as needed for nausea or vomiting. 20 tablet 0    oxyCODONE (ROXICODONE) 15 MG immediate release tablet Take 1-2 tablets (15-30 mg total) by mouth every 6 (six) hours as needed for pain. 30 tablet 0   pantoprazole (PROTONIX) 40 MG tablet Take 1 tablet (40 mg total) by mouth daily. (Patient taking differently: Take 40 mg by mouth every morning.) 30 tablet 11   sitaGLIPtin (JANUVIA) 100 MG tablet  Take 100 mg by mouth every morning.     sulfamethoxazole-trimethoprim (BACTRIM DS) 800-160 MG tablet Take 1 tablet by mouth 2 (two) times daily. 20 tablet 0   vitamin B-12 (CYANOCOBALAMIN) 500 MCG tablet Take 1 tablet (500 mcg total) by mouth daily. 90 tablet 1   No current facility-administered medications for this visit.     PHYSICAL EXAMINATION: ECOG PERFORMANCE STATUS: 1 - Symptomatic but completely ambulatory Vitals:   12/02/21 1046  BP: 124/83  Pulse: (!) 111  Resp: 18  Temp: (!) 96.1 F (35.6 C)   Filed Weights   12/02/21 1046  Weight: 121 lb 4.8 oz (55 kg)    Physical Exam Constitutional:      General: She is not in acute distress.    Comments: Patient walks with a cane  HENT:     Head: Normocephalic and atraumatic.  Eyes:     General: No scleral icterus. Cardiovascular:     Rate and Rhythm: Normal rate and regular rhythm.     Heart sounds: Normal heart sounds.  Pulmonary:     Effort: Pulmonary effort is normal. No respiratory distress.     Breath sounds: No wheezing.  Abdominal:     General: Bowel sounds are normal. There is no distension.     Palpations: Abdomen is soft.  Musculoskeletal:        General: No deformity. Normal range of motion.     Cervical back: Normal range of motion and neck supple.     Comments: History of right lower extremity below-knee amputation Status post left below-knee amputation, wound covered with dressing  Skin:    General: Skin is warm and dry.     Findings: No erythema or rash.  Neurological:     Mental Status: She is alert and oriented to person, place, and time. Mental status is at  baseline.     Cranial Nerves: No cranial nerve deficit.     Coordination: Coordination normal.  Psychiatric:        Mood and Affect: Mood normal.      LABORATORY DATA:  I have reviewed the data as listed Lab Results  Component Value Date   WBC 8.5 11/30/2021   HGB 10.0 (L) 11/30/2021   HCT 30.0 (L) 11/30/2021   MCV 81.1 11/30/2021   PLT 486 (H) 11/30/2021   Recent Labs    09/27/21 1034 10/13/21 1058 10/20/21 0500 11/02/21 1230 11/03/21 1924 11/05/21 1019 11/06/21 0820 11/07/21 0415 11/30/21 1050  NA 142   < > 138  --  140   < > 142 142 133*  K 4.2   < > 4.3  --  3.3*   < > 3.4* 3.6 4.1  CL 110   < > 108  --  109   < > 111 110 98  CO2 25   < > 24  --  17*  --  24 22 21*  GLUCOSE 162*   < > 218*  --  251*   < > 147* 203* 322*  BUN 16   < > 25*   < > 10   < > 13 15 25*  CREATININE 0.90   < > 0.84   < > 0.86   < > 1.02* 1.14* 0.97  CALCIUM 9.4   < > 9.3  --  9.9  --  9.5 9.0 9.8  GFRNONAA >60   < > >60   < > >60  --  >60 53* >60  PROT 6.9  --   --   --  8.3*  --   --   --  8.4*  ALBUMIN 2.9*   < > 2.9*  --  3.9  --   --   --  3.5  AST 116*  --   --   --  18  --   --   --  14*  ALT 66*  --   --   --  12  --   --   --  11  ALKPHOS 98  --   --   --  116  --   --   --  123  BILITOT 0.6  --   --   --  0.9  --   --   --  0.3   < > = values in this interval not displayed.    Iron/TIBC/Ferritin/ %Sat    Component Value Date/Time   IRON 41 11/30/2021 1050   TIBC 277 11/30/2021 1050   FERRITIN 129 11/30/2021 1050   IRONPCTSAT 15 11/30/2021 1050     [place] OR NERVE BLOCK-IMAGE ONLY [facility])  Result Date: 11/05/2021 There is no interpretation for this exam.  This order is for images obtained during a surgical procedure.  Please See "Surgeries" Tab for more information regarding the procedure.   CT ABDOMEN PELVIS W CONTRAST  Result Date: 11/03/2021 CLINICAL DATA:  Abdominal pain, acute, nonlocalized EXAM: CT ABDOMEN AND PELVIS WITH CONTRAST TECHNIQUE: Multidetector CT imaging  of the abdomen and pelvis was performed using the standard protocol following bolus administration of intravenous contrast. RADIATION DOSE REDUCTION: This exam was performed according to the departmental dose-optimization program which includes automated exposure control, adjustment of the mA and/or kV according to patient size and/or use of iterative reconstruction technique. CONTRAST:  121mL OMNIPAQUE IOHEXOL 300 MG/ML  SOLN COMPARISON:  04/24/2020 FINDINGS: Lower chest: No acute abnormality. Hepatobiliary: No focal liver abnormality is seen. Status post cholecystectomy. Mild extrahepatic biliary ductal dilation likely represents post cholecystectomy change. Pancreas: Unremarkable Spleen: Unremarkable Adrenals/Urinary Tract: The adrenal glands are unremarkable. The kidneys are normal in size and position. Tiny simple cortical cyst within the interpolar region of the right kidney. No further follow-up is recommended for this lesion. The kidneys are otherwise unremarkable. The bladder is unremarkable. Stomach/Bowel: Mild ascending colonic diverticulosis. The stomach, small bowel, and large bowel are otherwise unremarkable. No evidence of obstruction or focal inflammation. Appendix normal. No free intraperitoneal gas or fluid. Vascular/Lymphatic: Mild mixed atherosclerotic plaque within the abdominal aorta. No aortic aneurysm. Left common and external iliac artery and right external iliac artery stenting has been performed. Suspected hemodynamically significant stenosis at the origin of the right external iliac artery stent, however, this is not optimally characterized on this examination. Infiltrative changes within the left inguinal region are identified surrounding the left common femoral artery which may relate to recent catheterization and mild surrounding hemorrhage though acute infection could appear similarly. No pseudoaneurysm. Similar though milder appearing changes are noted at the right inguinal region.  Stented proximal superficial femoral arteries bilaterally appear occluded. No pathologic adenopathy within the abdomen and pelvis. Reproductive: Status post hysterectomy. No adnexal masses. Other: Tiny fat containing umbilical and right inguinal hernias. Rectum unremarkable. Musculoskeletal: No acute bone abnormality. No lytic or blastic bone lesion. IMPRESSION: 1. No acute intra-abdominal pathology identified. No definite radiographic explanation for the patient's reported symptoms. 2. Infiltrative changes within the left inguinal region surrounding the left common femoral artery which may relate to recent catheterization and mild surrounding hemorrhage though acute infection could appear similarly. Correlation with  recent surgical history and clinical examination is recommended. Similar though milder appearing changes are noted at the right inguinal region. No pseudoaneurysm. 3. Status post stenting of the left common and external iliac artery and right external iliac artery. Suspected hemodynamically significant stenosis at the origin of the right external iliac artery stent, however, this is not optimally characterized on this examination. 4. Stented proximal superficial femoral arteries bilaterally appear occluded. 5.  Aortic Atherosclerosis (ICD10-I70.0). Electronically Signed   By: Fidela Salisbury M.D.   On: 11/03/2021 20:44   DG Chest Portable 1 View  Result Date: 11/03/2021 CLINICAL DATA:  Shortness of breath EXAM: PORTABLE CHEST 1 VIEW COMPARISON:  10/18/2021 FINDINGS: Cardiac and mediastinal contours are within normal limits. No focal pulmonary opacity. No pleural effusion or pneumothorax. No acute osseous abnormality. IMPRESSION: No acute cardiopulmonary process. Electronically Signed   By: Merilyn Baba M.D.   On: 11/03/2021 19:53      ASSESSMENT & PLAN:  1. Anemia of chronic disease   2. Hyperglycemia   3. Below knee amputation (HCC)    Anemia of chronic disease - RA and CKD. Labs are  reviewed and discussed with patient.  Iron saturation 15, ferritin 129. Hemoglobin 10. Recommend patient to proceed with Venofer 200 mg x 1 to further increase iron stores. No need for erythropoietin replacement therapy currently.  Uncontrolled diabetes, this may affect her wound healing.  Recommend patient to continue follow-up with primary care provider.  Recent A1c in June 2023 was 8.2.  We will forward today's to her primary.  Below-knee amputation due to peripheral vascular disease, patient is on aspirin and Eliquis 5 mg twice daily.  Prescribed by vascular surgery. Follow-up in 6 months  Orders Placed This Encounter  Procedures   CBC with Differential/Platelet    Standing Status:   Future    Standing Expiration Date:   12/03/2022   Iron and TIBC    Standing Status:   Future    Standing Expiration Date:   12/03/2022   Ferritin    Standing Status:   Future    Standing Expiration Date:   12/03/2022    All questions were answered. The patient knows to call the clinic with any problems questions or concerns.    Earlie Server, MD, PhD Hematology Oncology  12/02/2021

## 2021-12-02 NOTE — Telephone Encounter (Signed)
Kipp Brood called with concerns that pt has an opening in her incision and may need a nurse to come in and do dressing changes.  If this is something we think would be helpful please call the office at [phone]

## 2021-12-02 NOTE — Telephone Encounter (Signed)
Yes let's get her some HH

## 2021-12-03 ENCOUNTER — Telehealth: Payer: Self-pay

## 2021-12-03 NOTE — Telephone Encounter (Signed)
Results faxed to PCP 

## 2021-12-03 NOTE — Telephone Encounter (Signed)
Spoke with [facility] and she is scheduled to be seen by a nurse today and the nurse should call our office with specifics on the wound.

## 2021-12-03 NOTE — Telephone Encounter (Signed)
-----   Message from Rickard Patience, MD sent at 12/02/2021  9:36 PM EDT ----- Please forward a copy of her CMP to her PCP.  PCP to be aware about her uncontrolled diabetes.  Glucose in 300s.

## 2021-12-04 ENCOUNTER — Other Ambulatory Visit (INDEPENDENT_AMBULATORY_CARE_PROVIDER_SITE_OTHER): Payer: Self-pay | Admitting: Nurse Practitioner

## 2021-12-04 ENCOUNTER — Telehealth (INDEPENDENT_AMBULATORY_CARE_PROVIDER_SITE_OTHER): Payer: Self-pay

## 2021-12-04 MED ORDER — OXYCODONE HCL 15 MG PO TABS: 15.0000 mg | ORAL_TABLET | Freq: Four times a day (QID) | ORAL | 0 refills | Status: DC | PRN

## 2021-12-04 NOTE — Telephone Encounter (Signed)
Pt called in and stated she needs a refill on her pain meds.  Please advise

## 2021-12-04 NOTE — Telephone Encounter (Signed)
Refill sent.

## 2021-12-04 NOTE — Telephone Encounter (Signed)
Darl Pikes called from [place] and states she evaluated pt yesterday and her findings are as folllows: Brown drainage on gauze, incision intact but loose around edges, and does not loo infected.

## 2021-12-04 NOTE — Telephone Encounter (Signed)
I spoke with Darl Pikes from [facility] gave verbal orders for zeroform and gauze changed daily.

## 2021-12-04 NOTE — Telephone Encounter (Signed)
Pt made aware

## 2021-12-04 NOTE — Telephone Encounter (Signed)
After speaking with Sheppard Plumber NP she states that is what it looked like at her last office visit.  Gave verbal orders for zeroform and gauze dressings changed daily.

## 2021-12-06 ENCOUNTER — Encounter (INDEPENDENT_AMBULATORY_CARE_PROVIDER_SITE_OTHER): Payer: Self-pay | Admitting: Nurse Practitioner

## 2021-12-09 ENCOUNTER — Telehealth (INDEPENDENT_AMBULATORY_CARE_PROVIDER_SITE_OTHER): Payer: Self-pay

## 2021-12-09 ENCOUNTER — Inpatient Hospital Stay: Payer: Medicare Other

## 2021-12-09 VITALS — BP 130/81 | HR 91 | Temp 97.4°F | Resp 18

## 2021-12-09 DIAGNOSIS — D649 Anemia, unspecified: Secondary | ICD-10-CM | POA: Diagnosis not present

## 2021-12-09 DIAGNOSIS — D638 Anemia in other chronic diseases classified elsewhere: Secondary | ICD-10-CM

## 2021-12-09 MED ORDER — SODIUM CHLORIDE 0.9 % IV SOLN
200.0000 mg | Freq: Once | INTRAVENOUS | Status: AC
  Administered 2021-12-09: 200 mg via INTRAVENOUS
  Filled 2021-12-09: qty 200

## 2021-12-09 MED ORDER — SODIUM CHLORIDE 0.9 % IV SOLN
Freq: Once | INTRAVENOUS | Status: AC
  Filled 2021-12-09: qty 250

## 2021-12-09 NOTE — Telephone Encounter (Signed)
Patient left a message stating that she fell on her stump while trying to transfer. The patient will sending a picture through mychart.

## 2021-12-09 NOTE — Telephone Encounter (Signed)
Patient called the office and left a message that she has contacted her home health nurse to come out for wound check

## 2021-12-13 ENCOUNTER — Encounter (INDEPENDENT_AMBULATORY_CARE_PROVIDER_SITE_OTHER): Payer: Self-pay | Admitting: Nurse Practitioner

## 2021-12-13 NOTE — Progress Notes (Signed)
Subjective:    Patient ID: Sylvia [name], female    DOB: 10-25-1955, 66 y.o.   MRN: [id number] Chief Complaint  Patient presents with   Follow-up    3 week follow up    The patient presents today for wound evaluation following left below-knee amputation.  The patient has had significant pain post amputation.  She notes that it makes it difficult for her to do her ADLs.  The patient does have a previous right below-knee amputation.  She notes that when she was at home she crawled on her stump and there is some evidence of opening of the wound areas but not full dehiscence.  There is drainage and it appears to be irritated in some areas.    Review of Systems  Musculoskeletal:  Positive for gait problem.  Skin:  Positive for wound.  All other systems reviewed and are negative.      Objective:   Physical Exam Vitals reviewed.  HENT:     Head: Normocephalic.  Cardiovascular:     Rate and Rhythm: Normal rate.  Pulmonary:     Effort: Pulmonary effort is normal.  Musculoskeletal:     Right Lower Extremity: Right leg is amputated below knee.     Left Lower Extremity: Left leg is amputated below knee.  Skin:    General: Skin is warm and dry.  Neurological:     Mental Status: She is alert and oriented to person, place, and time.  Psychiatric:        Mood and Affect: Mood normal.        Behavior: Behavior normal.        Thought Content: Thought content normal.        Judgment: Judgment normal.     BP 135/84 (BP Location: Left Arm)   Pulse 87   Resp 16   Past Medical History:  Diagnosis Date   Anemia of chronic disease 05/16/2019   Anxiety    h/o   Arthritis    Cocaine abuse (HCC)    COPD (chronic obstructive pulmonary disease) (HCC)    Depression    Diabetes mellitus without complication (HCC)    DKA (diabetic ketoacidosis) (HCC)    Elevated troponin    GERD (gastroesophageal reflux disease)    GI bleed    Hypertension    bp under control-off meds since 2019    Ischemic leg     Social History   Socioeconomic History   Marital status: Divorced    Spouse name: Not on file   Number of children: 2   Years of education: Not on file   Highest education level: Not on file  Occupational History   Not on file  Tobacco Use   Smoking status: Former    Packs/day: 0.25    Years: 45.00    Total pack years: 11.25    Types: Cigarettes   Smokeless tobacco: Never   Tobacco comments:    quit  Vaping Use   Vaping Use: Never used  Substance and Sexual Activity   Alcohol use: No   Drug use: Not Currently    Types: Cocaine    Comment: last used in April 2021 per patient-last 2 drug tests have been negative for cocaine   Sexual activity: Not Currently  Other Topics Concern   Not on file  Social History Narrative   5 grandchildren and 5 great grandchildren    2 grandchildren live with Pt. (25 & 62 y.o.)   Social Determinants of Health  Financial Resource Strain: Low Risk  (06/14/2018)   Overall Financial Resource Strain (CARDIA)    Difficulty of Paying Living Expenses: Not hard at all  Food Insecurity: Food Insecurity Present (06/14/2018)   Hunger Vital Sign    Worried About Running Out of Food in the Last Year: Sometimes true    Ran Out of Food in the Last Year: Sometimes true  Transportation Needs: No Transportation Needs (06/14/2018)   PRAPARE - Administrator, Civil Service (Medical): No    Lack of Transportation (Non-Medical): No  Physical Activity: Insufficiently Active (06/14/2018)   Exercise Vital Sign    Days of Exercise per Week: 4 days    Minutes of Exercise per Session: 20 min  Stress: No Stress Concern Present (06/14/2018)   [employer] of Occupational Health - Occupational Stress Questionnaire    Feeling of Stress : Only a little  Social Connections: Unknown (06/14/2018)   Social Connection and Isolation Panel [NHANES]    Frequency of Communication with Friends and Family: Patient refused    Frequency of Social  Gatherings with Friends and Family: Patient refused    Attends Religious Services: Patient refused    Active Member of Clubs or Organizations: Patient refused    Attends Banker Meetings: Patient refused    Marital Status: Patient refused  Intimate Partner Violence: Unknown (06/14/2018)   Humiliation, Afraid, Rape, and Kick questionnaire    Fear of Current or Ex-Partner: Patient refused    Emotionally Abused: Patient refused    Physically Abused: Patient refused    Sexually Abused: Patient refused    Past Surgical History:  Procedure Laterality Date   ABDOMINAL HYSTERECTOMY     AMPUTATION Right 12/27/2018   Procedure: AMPUTATION BELOW KNEE;  Surgeon: Annice Needy, MD;  Location: [facility];  Service: General;  Laterality: Right;   AMPUTATION Left 11/05/2021   Procedure: AMPUTATION BELOW KNEE;  Surgeon: Annice Needy, MD;  Location: [facility];  Service: Vascular;  Laterality: Left;   APPLICATION OF WOUND VAC  10/16/2021   Procedure: APPLICATION OF WOUND VAC;  Surgeon: Annice Needy, MD;  Location: [facility];  Service: Vascular;;   CATARACT EXTRACTION W/PHACO Right 03/27/2020   Procedure: CATARACT EXTRACTION PHACO AND INTRAOCULAR LENS PLACEMENT (IOC) RIGHT DIABETIC 7.54 00:52.5;  Surgeon: Galen Manila, MD;  Location: [facility];  Service: Ophthalmology;  Laterality: Right;   CATARACT EXTRACTION W/PHACO Left 05/20/2020   Procedure: CATARACT EXTRACTION PHACO AND INTRAOCULAR LENS PLACEMENT (IOC) LEFT DIABETIC 4.95 00:37.6;  Surgeon: Galen Manila, MD;  Location: [facility];  Service: Ophthalmology;  Laterality: Left;  Diabetic - oral meds COVID + 04-24-20   CHOLECYSTECTOMY     COLONOSCOPY WITH PROPOFOL N/A 04/16/2019   Procedure: COLONOSCOPY WITH PROPOFOL;  Surgeon: Pasty Spillers, MD;  Location: [facility];  Service: Endoscopy;  Laterality: N/A;   COLONOSCOPY WITH PROPOFOL N/A 01/16/2020   Procedure: COLONOSCOPY WITH PROPOFOL;  Surgeon: Pasty Spillers, MD;  Location: [facility];  Service: Endoscopy;  Laterality: N/A;   ENDARTERECTOMY FEMORAL Left 10/16/2021   Procedure: ENDARTERECTOMY FEMORAL;  Surgeon: Annice Needy, MD;  Location: [facility];  Service: Vascular;  Laterality: Left;   ESOPHAGOGASTRODUODENOSCOPY (EGD) WITH PROPOFOL N/A 06/15/2018   Procedure: ESOPHAGOGASTRODUODENOSCOPY (EGD) WITH PROPOFOL;  Surgeon: Toledo, Boykin Nearing, MD;  Location: [facility];  Service: Gastroenterology;  Laterality: N/A;   ESOPHAGOGASTRODUODENOSCOPY (EGD) WITH PROPOFOL N/A 01/03/2019   Procedure: ESOPHAGOGASTRODUODENOSCOPY (EGD) WITH PROPOFOL;  Surgeon: Midge Minium, MD;  Location: [facility];  Service: Endoscopy;  Laterality: N/A;   ESOPHAGOGASTRODUODENOSCOPY (EGD) WITH PROPOFOL N/A 09/25/2021   Procedure: ESOPHAGOGASTRODUODENOSCOPY (EGD) WITH PROPOFOL;  Surgeon: Midge Minium, MD;  Location: [facility];  Service: Endoscopy;  Laterality: N/A;   GIVENS CAPSULE STUDY  04/16/2019   Procedure: GIVENS CAPSULE STUDY;  Surgeon: Pasty Spillers, MD;  Location: [facility];  Service: Endoscopy;;   GIVENS CAPSULE STUDY N/A 06/25/2019   Procedure: GIVENS CAPSULE STUDY;  Surgeon: Wyline Mood, MD;  Location: [facility];  Service: Gastroenterology;  Laterality: N/A;   LOWER EXTREMITY ANGIOGRAPHY Right 08/21/2018   Procedure: LOWER EXTREMITY ANGIOGRAPHY;  Surgeon: Annice Needy, MD;  Location: [facility];  Service: Cardiovascular;  Laterality: Right;   LOWER EXTREMITY ANGIOGRAPHY Left 08/28/2018   Procedure: LOWER EXTREMITY ANGIOGRAPHY;  Surgeon: Annice Needy, MD;  Location: [facility];  Service: Cardiovascular;  Laterality: Left;   LOWER EXTREMITY ANGIOGRAPHY Right 08/28/2018   Procedure: Lower Extremity Angiography;  Surgeon: Annice Needy, MD;  Location: [facility];  Service: Cardiovascular;  Laterality: Right;   LOWER EXTREMITY ANGIOGRAPHY Right 12/18/2018   Procedure: Lower Extremity Angiography;  Surgeon: Annice Needy, MD;   Location: [facility];  Service: Cardiovascular;  Laterality: Right;   LOWER EXTREMITY ANGIOGRAPHY Right 12/18/2018   Procedure: Lower Extremity Angiography;  Surgeon: Annice Needy, MD;  Location: [facility];  Service: Cardiovascular;  Laterality: Right;   LOWER EXTREMITY ANGIOGRAPHY Left 12/21/2018   Procedure: Lower Extremity Angiography;  Surgeon: Annice Needy, MD;  Location: [facility];  Service: Cardiovascular;  Laterality: Left;   LOWER EXTREMITY ANGIOGRAPHY Right 12/21/2018   Procedure: Lower Extremity Angiography;  Surgeon: Annice Needy, MD;  Location: [facility];  Service: Cardiovascular;  Laterality: Right;   LOWER EXTREMITY ANGIOGRAPHY Left 12/25/2020   Procedure: LOWER EXTREMITY ANGIOGRAPHY;  Surgeon: Annice Needy, MD;  Location: [facility];  Service: Cardiovascular;  Laterality: Left;   LOWER EXTREMITY ANGIOGRAPHY Left 09/21/2021   Procedure: Lower Extremity Angiography;  Surgeon: Annice Needy, MD;  Location: [facility];  Service: Cardiovascular;  Laterality: Left;   LOWER EXTREMITY ANGIOGRAPHY Left 10/13/2021   Procedure: Lower Extremity Angiography;  Surgeon: Renford Dills, MD;  Location: [facility];  Service: Cardiovascular;  Laterality: Left;   LOWER EXTREMITY ANGIOGRAPHY Left 10/14/2021   Procedure: Lower Extremity Angiography;  Surgeon: Annice Needy, MD;  Location: [facility];  Service: Cardiovascular;  Laterality: Left;   LOWER EXTREMITY ANGIOGRAPHY Left 11/02/2021   Procedure: Lower Extremity Angiography;  Surgeon: Annice Needy, MD;  Location: [facility];  Service: Cardiovascular;  Laterality: Left;   LOWER EXTREMITY INTERVENTION N/A 12/22/2018   Procedure: LOWER EXTREMITY INTERVENTION;  Surgeon: Annice Needy, MD;  Location: [facility];  Service: Cardiovascular;  Laterality: N/A;   LOWER EXTREMITY INTERVENTION Left 12/26/2020   Procedure: LOWER EXTREMITY INTERVENTION;  Surgeon: Annice Needy, MD;  Location: [facility];  Service: Cardiovascular;  Laterality: Left;   LOWER EXTREMITY INTERVENTION N/A 09/22/2021   Procedure: LOWER EXTREMITY INTERVENTION;  Surgeon: Annice Needy, MD;  Location: [facility];  Service: Cardiovascular;  Laterality: N/A;    Family History  Problem Relation Age of Onset   Breast cancer Mother 21    Allergies  Allergen Reactions   Vancomycin Rash    Patient developed a rash to injection site and arm a few  mintes after starting ABX.    Hydrocodone Rash   Metformin And Related Diarrhea   Penicillins Hives    Has patient had a PCN reaction causing immediate rash, facial/tongue/throat swelling, SOB or lightheadedness with hypotension: Yes Has patient had a PCN reaction causing severe rash involving mucus membranes or skin necrosis: No Has patient had a PCN reaction that required hospitalization: No Has patient had a PCN reaction occurring within the last 10 years: No If all of the above answers are "NO", then may proceed with Cephalosporin use.   Tramadol Itching       Latest Ref Rng & Units 11/30/2021   10:50 AM 11/07/2021    8:08 PM 11/07/2021    4:15 AM  CBC  WBC 4.0 - 10.5 K/uL 8.5   7.6   Hemoglobin 12.0 - 15.0 g/dL 85.9  9.0  7.2   Hematocrit 36.0 - 46.0 % 30.0  27.0  22.3   Platelets 150 - 400 K/uL 486   337       CMP     Component Value Date/Time   NA 133 (L) 11/30/2021 1050   K 4.1 11/30/2021 1050   CL 98 11/30/2021 1050   CO2 21 (L) 11/30/2021 1050   GLUCOSE 322 (H) 11/30/2021 1050   BUN 25 (H) 11/30/2021 1050   CREATININE 0.97 11/30/2021 1050   CALCIUM 9.8 11/30/2021 1050   PROT 8.4 (H) 11/30/2021 1050   ALBUMIN 3.5 11/30/2021 1050   AST 14 (L) 11/30/2021 1050   ALT 11 11/30/2021 1050   ALKPHOS 123 11/30/2021 1050   BILITOT 0.3 11/30/2021 1050   GFRNONAA >60 11/30/2021 1050   GFRAA >60 11/23/2019 1505     VAS [place] ABI WITH/WO TBI  Result Date: 11/05/2021  LOWER EXTREMITY DOPPLER STUDY Patient Name:   JORDIE BUMANN  Date of Exam:   10/30/2021 Medical Rec #: [id number]           Accession #:    [id number] Date of Birth: 09-02-1955            Patient Gender: F Patient Age:   88 years Exam Location:  [facility] Procedure:      VAS [place] ABI WITH/WO TBI Referring Phys: GREGORY SCHNIER --------------------------------------------------------------------------------  Indications: Peripheral artery disease, and right BKA.  Vascular Interventions: 10/16/2021 left cfa endart. lt sfa stent thrombectomy                         08/21/18: Right SFA/popliteal artery PTA/stent;                         08/28/18: Left EIA & SFA/popliteal artery stents;                         08/28/18: Right popliteal, TP trunk & peroneal artery                         thrombectomies/PTAs;                         12/27/18: Right BKA;                         12/25/20: Left SFA/popliteal thrombectomy/PTA;  12/26/20: Left proximal SFA thombectomy/stent;                          09/21/2021: Aortogram and Selective Left Lower Extremity                         angiogram. Mechanical Trhombectomy to the Left SFA and                         Popliteal Artery with the [place] Rex device. PTA of the                         Left Anterior Tibial Artery with 2.5 mm diameter by 30                         cm length angioplasty. PTA of the Left SFA and Popliteal                         Artery. Placement of a 135 cm total length 50 cm working                         length thrombolytic catheter from the Left SFA down to                         the proximal ATA.                          09/22/2021: Stent placement to Left Popliteal Artery                         with 6 mm diameter by 7.5 cm length Viabahn stent.                         Angioplasty of the Left ATA with 3 mm diameter by 10 cm                         length balloon. Mechanical thrombectomy of the Left ATA                         with the penumbra CAT 6 device. Performing  Technologist: Salvadore Farber RVT  Examination Guidelines: A complete evaluation includes at minimum, Doppler waveform signals and systolic blood pressure reading at the level of bilateral brachial, anterior tibial, and posterior tibial arteries, when vessel segments are accessible. Bilateral testing is considered an integral part of a complete examination. Photoelectric Plethysmograph (PPG) waveforms and toe systolic pressure readings are included as required and additional duplex testing as needed. Limited examinations for reoccurring indications may be performed as noted.  ABI Findings: +--------+------------------+-----+--------+--------+ Right   Rt Pressure (mmHg)IndexWaveformComment  +--------+------------------+-----+--------+--------+ [id number]                                     +--------+------------------+-----+--------+--------+ +---------+------------------+-----+--------+-------+ Left     Lt Pressure (mmHg)IndexWaveformComment +---------+------------------+-----+--------+-------+ Brachial 138                                    +---------+------------------+-----+--------+-------+  ATA      80                0.56                 +---------+------------------+-----+--------+-------+ PTA      68                0.48                 +---------+------------------+-----+--------+-------+ Great Toe0                 0.00 Absent          +---------+------------------+-----+--------+-------+ +-------+-----------+-----------+------------+------------+ ABI/TBIToday's ABIToday's TBIPrevious ABIPrevious TBI +-------+-----------+-----------+------------+------------+ Left   .56        absent     .47         .25          +-------+-----------+-----------+------------+------------+  Summary: Left: Resting left ankle-brachial index indicates severe left lower extremity arterial disease. No flow identified in toe. Added duplex shows SFA pop stents are occluded. Collateral  feed to TP trunk with low monophasic flow in the distal PTA and ATA. *See table(s) above for measurements and observations.  Electronically signed by Levora Dredge MD on 11/05/2021 at 4:59:11 PM.    Final        Assessment & Plan:   1. Hx of BKA, left (HCC) The patient still continues to have notable swelling of her stump area.  There is still no dehiscence. We will remove staples.  We will have patient return in 2 weeks to return for staple removal at that time.   Current Outpatient Medications on File Prior to Visit  Medication Sig Dispense Refill   acetaminophen (TYLENOL) 325 MG tablet Take 650 mg by mouth every 6 (six) hours as needed.     albuterol (PROVENTIL HFA;VENTOLIN HFA) 108 (90 Base) MCG/ACT inhaler Inhale 2 puffs into the lungs every 6 (six) hours as needed for wheezing or shortness of breath. 1 Inhaler 2   apixaban (ELIQUIS) 5 MG TABS tablet Take 1 tablet (5 mg total) by mouth 2 (two) times daily. 60 tablet 12   aspirin 81 MG chewable tablet Chew 1 tablet (81 mg total) by mouth daily. 30 tablet 11   atorvastatin (LIPITOR) 10 MG tablet Take 10 mg by mouth at bedtime.     buPROPion (ZYBAN) 150 MG 12 hr tablet Take 150 mg by mouth 2 (two) times daily.     cetirizine (ZYRTEC) 10 MG tablet Take 10 mg by mouth daily as needed for allergies.     docusate sodium (COLACE) 100 MG capsule Take 100 mg by mouth daily.     DULoxetine (CYMBALTA) 60 MG capsule Take 1 capsule (60 mg total) by mouth daily. 30 capsule 3   famotidine (PEPCID) 20 MG tablet Take 1 tablet (20 mg total) by mouth at bedtime. 30 tablet 3   ferrous sulfate 325 (65 FE) MG tablet Take 325 mg by mouth daily.      folic acid (FOLVITE) 1 MG tablet Take 1 mg by mouth daily.     hydroxychloroquine (PLAQUENIL) 200 MG tablet Take 200 mg by mouth daily.     JARDIANCE 10 MG TABS tablet Take 10 mg by mouth every morning.     lisinopril (ZESTRIL) 10 MG tablet Take 1 tablet (10 mg total) by mouth daily. (Patient taking differently:  Take 10 mg by mouth every morning.) 30 tablet 1   Multiple Vitamins-Minerals (WOMENS MULTIVITAMIN PO) Take 1 tablet  by mouth daily.     ondansetron (ZOFRAN-ODT) 4 MG disintegrating tablet Take 1 tablet (4 mg total) by mouth every 8 (eight) hours as needed for nausea or vomiting. 20 tablet 0   pantoprazole (PROTONIX) 40 MG tablet Take 1 tablet (40 mg total) by mouth daily. (Patient taking differently: Take 40 mg by mouth every morning.) 30 tablet 11   sitaGLIPtin (JANUVIA) 100 MG tablet Take 100 mg by mouth every morning.     sulfamethoxazole-trimethoprim (BACTRIM DS) 800-160 MG tablet Take 1 tablet by mouth 2 (two) times daily. 20 tablet 0   vitamin B-12 (CYANOCOBALAMIN) 500 MCG tablet Take 1 tablet (500 mcg total) by mouth daily. 90 tablet 1   No current facility-administered medications on file prior to visit.    There are no Patient Instructions on file for this visit. No follow-ups on file.   Georgiana Spinner, NP

## 2021-12-17 ENCOUNTER — Encounter (INDEPENDENT_AMBULATORY_CARE_PROVIDER_SITE_OTHER): Payer: Self-pay | Admitting: Nurse Practitioner

## 2021-12-17 ENCOUNTER — Ambulatory Visit (INDEPENDENT_AMBULATORY_CARE_PROVIDER_SITE_OTHER): Payer: Medicare Other | Admitting: Nurse Practitioner

## 2021-12-17 VITALS — BP 180/81 | HR 17 | Resp 17

## 2021-12-17 DIAGNOSIS — Z89512 Acquired absence of left leg below knee: Secondary | ICD-10-CM

## 2021-12-17 MED ORDER — LORAZEPAM 0.5 MG PO TABS: 0.5000 mg | ORAL_TABLET | Freq: Three times a day (TID) | ORAL | 0 refills | Status: DC | PRN

## 2021-12-17 MED ORDER — SULFAMETHOXAZOLE-TRIMETHOPRIM 800-160 MG PO TABS: 1.0000 | ORAL_TABLET | Freq: Two times a day (BID) | ORAL | 0 refills | Status: DC

## 2021-12-17 MED ORDER — OXYCODONE HCL 15 MG PO TABS: 15.0000 mg | ORAL_TABLET | Freq: Four times a day (QID) | ORAL | 0 refills | Status: DC | PRN

## 2021-12-23 ENCOUNTER — Telehealth (INDEPENDENT_AMBULATORY_CARE_PROVIDER_SITE_OTHER): Payer: Self-pay

## 2021-12-23 NOTE — Telephone Encounter (Signed)
Sylvia [name] from [facility] left a message reporting that the patient surgical incision looks well,pink, no redness. The wound has some drainage with no signs of infection. The nurse will continue with using xeroform over the area.

## 2022-01-04 ENCOUNTER — Encounter (INDEPENDENT_AMBULATORY_CARE_PROVIDER_SITE_OTHER): Payer: Self-pay | Admitting: Nurse Practitioner

## 2022-01-04 ENCOUNTER — Ambulatory Visit (INDEPENDENT_AMBULATORY_CARE_PROVIDER_SITE_OTHER): Payer: Medicare Other | Admitting: Nurse Practitioner

## 2022-01-04 VITALS — BP 106/69 | HR 101 | Resp 16

## 2022-01-04 DIAGNOSIS — Z89512 Acquired absence of left leg below knee: Secondary | ICD-10-CM

## 2022-01-04 MED ORDER — OXYCODONE HCL 15 MG PO TABS: 15.0000 mg | ORAL_TABLET | Freq: Four times a day (QID) | ORAL | 0 refills | Status: DC | PRN

## 2022-01-04 NOTE — Progress Notes (Signed)
Subjective:    Patient ID: Sylvia [name], female    DOB: 14-Dec-1955, 66 y.o.   MRN: [id number] No chief complaint on file.   Patient returns today for evaluation of her left below-knee amputation.  The patient has some fibrinous exudate extending between her incisions.  She continues to have extensive pain.  Her staples are still in place but she still continues to have significant drainage.  He also has a significant anxiety portion as well    Review of Systems  Skin:  Positive for wound.  Psychiatric/Behavioral:  The patient is nervous/anxious.   All other systems reviewed and are negative.      Objective:   Physical Exam Vitals reviewed.  HENT:     Head: Normocephalic.  Cardiovascular:     Rate and Rhythm: Normal rate.  Pulmonary:     Effort: Pulmonary effort is normal.  Skin:    General: Skin is warm and dry.  Neurological:     Mental Status: She is alert and oriented to person, place, and time.  Psychiatric:        Mood and Affect: Mood normal.        Behavior: Behavior normal.        Thought Content: Thought content normal.        Judgment: Judgment normal.     BP (!) 180/81 (BP Location: Right Arm)   Pulse (!) 17   Resp 17   Past Medical History:  Diagnosis Date   Anemia of chronic disease 05/16/2019   Anxiety    h/o   Arthritis    Cocaine abuse (HCC)    COPD (chronic obstructive pulmonary disease) (HCC)    Depression    Diabetes mellitus without complication (HCC)    DKA (diabetic ketoacidosis) (HCC)    Elevated troponin    GERD (gastroesophageal reflux disease)    GI bleed    Hypertension    bp under control-off meds since 2019   Ischemic leg     Social History   Socioeconomic History   Marital status: Divorced    Spouse name: Not on file   Number of children: 2   Years of education: Not on file   Highest education level: Not on file  Occupational History   Not on file  Tobacco Use   Smoking status: Former    Packs/day: 0.25     Years: 45.00    Total pack years: 11.25    Types: Cigarettes   Smokeless tobacco: Never   Tobacco comments:    quit  Vaping Use   Vaping Use: Never used  Substance and Sexual Activity   Alcohol use: No   Drug use: Not Currently    Types: Cocaine    Comment: last used in April 2021 per patient-last 2 drug tests have been negative for cocaine   Sexual activity: Not Currently  Other Topics Concern   Not on file  Social History Narrative   5 grandchildren and 5 great grandchildren    2 grandchildren live with Pt. (25 & 13 y.o.)   Social Determinants of Health   Financial Resource Strain: Low Risk  (06/14/2018)   Overall Financial Resource Strain (CARDIA)    Difficulty of Paying Living Expenses: Not hard at all  Food Insecurity: Food Insecurity Present (06/14/2018)   Hunger Vital Sign    Worried About Running Out of Food in the Last Year: Sometimes true    Ran Out of Food in the Last Year: Sometimes true  Transportation  Needs: No Transportation Needs (06/14/2018)   PRAPARE - Hydrologist (Medical): No    Lack of Transportation (Non-Medical): No  Physical Activity: Insufficiently Active (06/14/2018)   Exercise Vital Sign    Days of Exercise per Week: 4 days    Minutes of Exercise per Session: 20 min  Stress: No Stress Concern Present (06/14/2018)   [place]    Feeling of Stress : Only a little  Social Connections: Unknown (06/14/2018)   Social Connection and Isolation Panel [NHANES]    Frequency of Communication with Friends and Family: Patient refused    Frequency of Social Gatherings with Friends and Family: Patient refused    Attends Religious Services: Patient refused    Active Member of Clubs or Organizations: Patient refused    Attends Archivist Meetings: Patient refused    Marital Status: Patient refused  Intimate Partner Violence: Unknown (06/14/2018)   Humiliation,  Afraid, Rape, and Kick questionnaire    Fear of Current or Ex-Partner: Patient refused    Emotionally Abused: Patient refused    Physically Abused: Patient refused    Sexually Abused: Patient refused    Past Surgical History:  Procedure Laterality Date   ABDOMINAL HYSTERECTOMY     AMPUTATION Right 12/27/2018   Procedure: AMPUTATION BELOW KNEE;  Surgeon: Algernon Huxley, MD;  Location: [facility];  Service: General;  Laterality: Right;   AMPUTATION Left 11/05/2021   Procedure: AMPUTATION BELOW KNEE;  Surgeon: Algernon Huxley, MD;  Location: [facility];  Service: Vascular;  Laterality: Left;   APPLICATION OF WOUND VAC  10/16/2021   Procedure: APPLICATION OF WOUND VAC;  Surgeon: Algernon Huxley, MD;  Location: [facility];  Service: Vascular;;   CATARACT EXTRACTION W/PHACO Right 03/27/2020   Procedure: CATARACT EXTRACTION PHACO AND INTRAOCULAR LENS PLACEMENT ([place]) RIGHT DIABETIC 7.54 00:52.5;  Surgeon: Birder Robson, MD;  Location: [place];  Service: Ophthalmology;  Laterality: Right;   CATARACT EXTRACTION W/PHACO Left 05/20/2020   Procedure: CATARACT EXTRACTION PHACO AND INTRAOCULAR LENS PLACEMENT ([facility]) LEFT DIABETIC 4.95 00:37.6;  Surgeon: Birder Robson, MD;  Location: [place];  Service: Ophthalmology;  Laterality: Left;  Diabetic - oral meds COVID + 04-24-20   CHOLECYSTECTOMY     COLONOSCOPY WITH PROPOFOL N/A 04/16/2019   Procedure: COLONOSCOPY WITH PROPOFOL;  Surgeon: Virgel Manifold, MD;  Location: [facility];  Service: Endoscopy;  Laterality: N/A;   COLONOSCOPY WITH PROPOFOL N/A 01/16/2020   Procedure: COLONOSCOPY WITH PROPOFOL;  Surgeon: Virgel Manifold, MD;  Location: [facility];  Service: Endoscopy;  Laterality: N/A;   ENDARTERECTOMY FEMORAL Left 10/16/2021   Procedure: ENDARTERECTOMY FEMORAL;  Surgeon: Algernon Huxley, MD;  Location: [facility];  Service: Vascular;  Laterality: Left;   ESOPHAGOGASTRODUODENOSCOPY (EGD) WITH PROPOFOL N/A 06/15/2018   Procedure:  ESOPHAGOGASTRODUODENOSCOPY (EGD) WITH PROPOFOL;  Surgeon: [place], Benay Pike, MD;  Location: [facility];  Service: Gastroenterology;  Laterality: N/A;   ESOPHAGOGASTRODUODENOSCOPY (EGD) WITH PROPOFOL N/A 01/03/2019   Procedure: ESOPHAGOGASTRODUODENOSCOPY (EGD) WITH PROPOFOL;  Surgeon: Lucilla Lame, MD;  Location: [facility];  Service: Endoscopy;  Laterality: N/A;   ESOPHAGOGASTRODUODENOSCOPY (EGD) WITH PROPOFOL N/A 09/25/2021   Procedure: ESOPHAGOGASTRODUODENOSCOPY (EGD) WITH PROPOFOL;  Surgeon: Lucilla Lame, MD;  Location: [facility];  Service: Endoscopy;  Laterality: N/A;   GIVENS CAPSULE STUDY  04/16/2019   Procedure: GIVENS CAPSULE STUDY;  Surgeon: Virgel Manifold, MD;  Location: [facility];  Service: Endoscopy;;   GIVENS CAPSULE STUDY  N/A 06/25/2019   Procedure: GIVENS CAPSULE STUDY;  Surgeon: Jonathon Bellows, MD;  Location: [facility];  Service: Gastroenterology;  Laterality: N/A;   LOWER EXTREMITY ANGIOGRAPHY Right 08/21/2018   Procedure: LOWER EXTREMITY ANGIOGRAPHY;  Surgeon: Algernon Huxley, MD;  Location: [place] [facility];  Service: Cardiovascular;  Laterality: Right;   LOWER EXTREMITY ANGIOGRAPHY Left 08/28/2018   Procedure: LOWER EXTREMITY ANGIOGRAPHY;  Surgeon: Algernon Huxley, MD;  Location: [place] [facility];  Service: Cardiovascular;  Laterality: Left;   LOWER EXTREMITY ANGIOGRAPHY Right 08/28/2018   Procedure: Lower Extremity Angiography;  Surgeon: Algernon Huxley, MD;  Location: [place] [facility];  Service: Cardiovascular;  Laterality: Right;   LOWER EXTREMITY ANGIOGRAPHY Right 12/18/2018   Procedure: Lower Extremity Angiography;  Surgeon: Algernon Huxley, MD;  Location: [place] [facility];  Service: Cardiovascular;  Laterality: Right;   LOWER EXTREMITY ANGIOGRAPHY Right 12/18/2018   Procedure: Lower Extremity Angiography;  Surgeon: Algernon Huxley, MD;  Location: [place] [facility];  Service: Cardiovascular;  Laterality: Right;   LOWER EXTREMITY ANGIOGRAPHY Left 12/21/2018    Procedure: Lower Extremity Angiography;  Surgeon: Algernon Huxley, MD;  Location: [place] [facility];  Service: Cardiovascular;  Laterality: Left;   LOWER EXTREMITY ANGIOGRAPHY Right 12/21/2018   Procedure: Lower Extremity Angiography;  Surgeon: Algernon Huxley, MD;  Location: [place] [facility];  Service: Cardiovascular;  Laterality: Right;   LOWER EXTREMITY ANGIOGRAPHY Left 12/25/2020   Procedure: LOWER EXTREMITY ANGIOGRAPHY;  Surgeon: Algernon Huxley, MD;  Location: [place] [facility];  Service: Cardiovascular;  Laterality: Left;   LOWER EXTREMITY ANGIOGRAPHY Left 09/21/2021   Procedure: Lower Extremity Angiography;  Surgeon: Algernon Huxley, MD;  Location: [place] [facility];  Service: Cardiovascular;  Laterality: Left;   LOWER EXTREMITY ANGIOGRAPHY Left 10/13/2021   Procedure: Lower Extremity Angiography;  Surgeon: Katha Cabal, MD;  Location: [place] [facility];  Service: Cardiovascular;  Laterality: Left;   LOWER EXTREMITY ANGIOGRAPHY Left 10/14/2021   Procedure: Lower Extremity Angiography;  Surgeon: Algernon Huxley, MD;  Location: [place] [facility];  Service: Cardiovascular;  Laterality: Left;   LOWER EXTREMITY ANGIOGRAPHY Left 11/02/2021   Procedure: Lower Extremity Angiography;  Surgeon: Algernon Huxley, MD;  Location: [place] [facility];  Service: Cardiovascular;  Laterality: Left;   LOWER EXTREMITY INTERVENTION N/A 12/22/2018   Procedure: LOWER EXTREMITY INTERVENTION;  Surgeon: Algernon Huxley, MD;  Location: [place] [facility];  Service: Cardiovascular;  Laterality: N/A;   LOWER EXTREMITY INTERVENTION Left 12/26/2020   Procedure: LOWER EXTREMITY INTERVENTION;  Surgeon: Algernon Huxley, MD;  Location: [place] [facility];  Service: Cardiovascular;  Laterality: Left;   LOWER EXTREMITY INTERVENTION N/A 09/22/2021   Procedure: LOWER EXTREMITY INTERVENTION;  Surgeon: Algernon Huxley, MD;  Location: [place] [facility];  Service: Cardiovascular;  Laterality: N/A;    Family History  Problem Relation  Age of Onset   Breast cancer Mother 64    Allergies  Allergen Reactions   Vancomycin Rash    Patient developed a rash to injection site and arm a few mintes after starting ABX.    Hydrocodone Rash   Metformin And Related Diarrhea   Penicillins Hives    Has patient had a PCN reaction causing immediate rash, facial/tongue/throat swelling, SOB or lightheadedness with hypotension: Yes Has patient had a PCN reaction causing severe rash involving mucus membranes or skin necrosis: No Has patient had a PCN reaction that required hospitalization: No Has patient had  a PCN reaction occurring within the last 10 years: No If all of the above answers are "NO", then may proceed with Cephalosporin use.   Tramadol Itching       Latest Ref Rng & Units 11/30/2021   10:50 AM 11/07/2021    8:08 PM 11/07/2021    4:15 AM  CBC  WBC 4.0 - 10.5 K/uL 8.5   7.6   Hemoglobin 12.0 - 15.0 g/dL 10.0  9.0  7.2   Hematocrit 36.0 - 46.0 % 30.0  27.0  22.3   Platelets 150 - 400 K/uL 486   337       CMP     Component Value Date/Time   NA 133 (L) 11/30/2021 1050   K 4.1 11/30/2021 1050   CL 98 11/30/2021 1050   CO2 21 (L) 11/30/2021 1050   GLUCOSE 322 (H) 11/30/2021 1050   BUN 25 (H) 11/30/2021 1050   CREATININE 0.97 11/30/2021 1050   CALCIUM 9.8 11/30/2021 1050   PROT 8.4 (H) 11/30/2021 1050   ALBUMIN 3.5 11/30/2021 1050   AST 14 (L) 11/30/2021 1050   ALT 11 11/30/2021 1050   ALKPHOS 123 11/30/2021 1050   BILITOT 0.3 11/30/2021 1050   GFRNONAA >60 11/30/2021 1050   GFRAA >60 11/23/2019 1505     No results found.     Assessment & Plan:   1. Hx of BKA, left ([place]) Refill sent for patient pain medication.  Patient also given something for anxiety as during partial stable we will she began to have a significant panic attack.  Unable to continue with stable we will treat his panic attack.  Patient advised to take pain medication as well as anxiety medication for the next staple removal.  Patient will  return in 2 weeks.   Current Outpatient Medications on File Prior to Visit  Medication Sig Dispense Refill   acetaminophen (TYLENOL) 325 MG tablet Take 650 mg by mouth every 6 (six) hours as needed.     albuterol (PROVENTIL HFA;VENTOLIN HFA) 108 (90 Base) MCG/ACT inhaler Inhale 2 puffs into the lungs every 6 (six) hours as needed for wheezing or shortness of breath. 1 Inhaler 2   apixaban (ELIQUIS) 5 MG TABS tablet Take 1 tablet (5 mg total) by mouth 2 (two) times daily. 60 tablet 12   aspirin 81 MG chewable tablet Chew 1 tablet (81 mg total) by mouth daily. 30 tablet 11   atorvastatin (LIPITOR) 10 MG tablet Take 10 mg by mouth at bedtime.     buPROPion (ZYBAN) 150 MG 12 hr tablet Take 150 mg by mouth 2 (two) times daily.     cetirizine (ZYRTEC) 10 MG tablet Take 10 mg by mouth daily as needed for allergies.     docusate sodium (COLACE) 100 MG capsule Take 100 mg by mouth daily.     DULoxetine (CYMBALTA) 60 MG capsule Take 1 capsule (60 mg total) by mouth daily. 30 capsule 3   famotidine (PEPCID) 20 MG tablet Take 1 tablet (20 mg total) by mouth at bedtime. 30 tablet 3   ferrous sulfate 325 (65 FE) MG tablet Take 325 mg by mouth daily.      folic acid (FOLVITE) 1 MG tablet Take 1 mg by mouth daily.     gabapentin (NEURONTIN) 300 MG capsule TAKE 3 CAPSULES BY MOUTH IN THE  MORNING AND 3 CAPSULES BY MOUTH  IN THE EVENING 450 capsule 3   hydroxychloroquine (PLAQUENIL) 200 MG tablet Take 200 mg by mouth daily.  JARDIANCE 10 MG TABS tablet Take 10 mg by mouth every morning.     lisinopril (ZESTRIL) 10 MG tablet Take 1 tablet (10 mg total) by mouth daily. (Patient taking differently: Take 10 mg by mouth every morning.) 30 tablet 1   Multiple Vitamins-Minerals (WOMENS MULTIVITAMIN PO) Take 1 tablet by mouth daily.     ondansetron (ZOFRAN-ODT) 4 MG disintegrating tablet Take 1 tablet (4 mg total) by mouth every 8 (eight) hours as needed for nausea or vomiting. 20 tablet 0   pantoprazole (PROTONIX)  40 MG tablet Take 1 tablet (40 mg total) by mouth daily. (Patient taking differently: Take 40 mg by mouth every morning.) 30 tablet 11   sitaGLIPtin (JANUVIA) 100 MG tablet Take 100 mg by mouth every morning.     vitamin B-12 (CYANOCOBALAMIN) 500 MCG tablet Take 1 tablet (500 mcg total) by mouth daily. 90 tablet 1   No current facility-administered medications on file prior to visit.    There are no Patient Instructions on file for this visit. No follow-ups on file.   Kris Hartmann, NP

## 2022-01-04 NOTE — Progress Notes (Signed)
Subjective:    Patient ID: Sylvia [name], female    DOB: 1955-10-04, 66 y.o.   MRN: [id number] Chief Complaint  Patient presents with   Follow-up    2 week follow up    The patient returns today for staple removal.  Portion of the wound has dehisced but the other portion is healing well.  The portion that is dehisced has significant fibrinous exudate in the wound bed.  Today the patient has premedicated prior to staple removal.  She continues to note that she still has continued pain and discomfort.    Review of Systems  Skin:  Positive for wound.  All other systems reviewed and are negative.      Objective:   Physical Exam Vitals reviewed.  HENT:     Head: Normocephalic.  Cardiovascular:     Rate and Rhythm: Normal rate.  Pulmonary:     Effort: Pulmonary effort is normal.  Musculoskeletal:     Right Lower Extremity: Right leg is amputated below knee.     Left Lower Extremity: Left leg is amputated below knee.  Skin:    General: Skin is warm and dry.  Neurological:     Mental Status: She is alert and oriented to person, place, and time.  Psychiatric:        Mood and Affect: Mood normal.        Behavior: Behavior normal.        Thought Content: Thought content normal.        Judgment: Judgment normal.     BP 106/69 (BP Location: Left Arm)   Pulse (!) 101   Resp 16   Past Medical History:  Diagnosis Date   Anemia of chronic disease 05/16/2019   Anxiety    h/o   Arthritis    Cocaine abuse (HCC)    COPD (chronic obstructive pulmonary disease) (HCC)    Depression    Diabetes mellitus without complication (HCC)    DKA (diabetic ketoacidosis) (HCC)    Elevated troponin    GERD (gastroesophageal reflux disease)    GI bleed    Hypertension    bp under control-off meds since 2019   Ischemic leg     Social History   Socioeconomic History   Marital status: Divorced    Spouse name: Not on file   Number of children: 2   Years of education: Not on file    Highest education level: Not on file  Occupational History   Not on file  Tobacco Use   Smoking status: Former    Packs/day: 0.25    Years: 45.00    Total pack years: 11.25    Types: Cigarettes   Smokeless tobacco: Never   Tobacco comments:    quit  Vaping Use   Vaping Use: Never used  Substance and Sexual Activity   Alcohol use: No   Drug use: Not Currently    Types: Cocaine    Comment: last used in April 2021 per patient-last 2 drug tests have been negative for cocaine   Sexual activity: Not Currently  Other Topics Concern   Not on file  Social History Narrative   5 grandchildren and 5 great grandchildren    2 grandchildren live with Pt. (25 & 87 y.o.)   Social Determinants of Health   Financial Resource Strain: Low Risk  (06/14/2018)   Overall Financial Resource Strain (CARDIA)    Difficulty of Paying Living Expenses: Not hard at all  Food Insecurity: Food Insecurity Present (  06/14/2018)   Hunger Vital Sign    Worried About Running Out of Food in the Last Year: Sometimes true    Ran Out of Food in the Last Year: Sometimes true  Transportation Needs: No Transportation Needs (06/14/2018)   PRAPARE - Administrator, Civil Service (Medical): No    Lack of Transportation (Non-Medical): No  Physical Activity: Insufficiently Active (06/14/2018)   Exercise Vital Sign    Days of Exercise per Week: 4 days    Minutes of Exercise per Session: 20 min  Stress: No Stress Concern Present (06/14/2018)   [employer] of Occupational Health - Occupational Stress Questionnaire    Feeling of Stress : Only a little  Social Connections: Unknown (06/14/2018)   Social Connection and Isolation Panel [NHANES]    Frequency of Communication with Friends and Family: Patient refused    Frequency of Social Gatherings with Friends and Family: Patient refused    Attends Religious Services: Patient refused    Active Member of Clubs or Organizations: Patient refused    Attends Tax inspector Meetings: Patient refused    Marital Status: Patient refused  Intimate Partner Violence: Unknown (06/14/2018)   Humiliation, Afraid, Rape, and Kick questionnaire    Fear of Current or Ex-Partner: Patient refused    Emotionally Abused: Patient refused    Physically Abused: Patient refused    Sexually Abused: Patient refused    Past Surgical History:  Procedure Laterality Date   ABDOMINAL HYSTERECTOMY     AMPUTATION Right 12/27/2018   Procedure: AMPUTATION BELOW KNEE;  Surgeon: Annice Needy, MD;  Location: [facility];  Service: General;  Laterality: Right;   AMPUTATION Left 11/05/2021   Procedure: AMPUTATION BELOW KNEE;  Surgeon: Annice Needy, MD;  Location: [facility];  Service: Vascular;  Laterality: Left;   APPLICATION OF WOUND VAC  10/16/2021   Procedure: APPLICATION OF WOUND VAC;  Surgeon: Annice Needy, MD;  Location: [facility];  Service: Vascular;;   CATARACT EXTRACTION W/PHACO Right 03/27/2020   Procedure: CATARACT EXTRACTION PHACO AND INTRAOCULAR LENS PLACEMENT (IOC) RIGHT DIABETIC 7.54 00:52.5;  Surgeon: Galen Manila, MD;  Location: [facility];  Service: Ophthalmology;  Laterality: Right;   CATARACT EXTRACTION W/PHACO Left 05/20/2020   Procedure: CATARACT EXTRACTION PHACO AND INTRAOCULAR LENS PLACEMENT (IOC) LEFT DIABETIC 4.95 00:37.6;  Surgeon: Galen Manila, MD;  Location: [facility];  Service: Ophthalmology;  Laterality: Left;  Diabetic - oral meds COVID + 04-24-20   CHOLECYSTECTOMY     COLONOSCOPY WITH PROPOFOL N/A 04/16/2019   Procedure: COLONOSCOPY WITH PROPOFOL;  Surgeon: Pasty Spillers, MD;  Location: [facility];  Service: Endoscopy;  Laterality: N/A;   COLONOSCOPY WITH PROPOFOL N/A 01/16/2020   Procedure: COLONOSCOPY WITH PROPOFOL;  Surgeon: Pasty Spillers, MD;  Location: [facility];  Service: Endoscopy;  Laterality: N/A;   ENDARTERECTOMY FEMORAL Left 10/16/2021   Procedure: ENDARTERECTOMY FEMORAL;  Surgeon: Annice Needy, MD;   Location: [facility];  Service: Vascular;  Laterality: Left;   ESOPHAGOGASTRODUODENOSCOPY (EGD) WITH PROPOFOL N/A 06/15/2018   Procedure: ESOPHAGOGASTRODUODENOSCOPY (EGD) WITH PROPOFOL;  Surgeon: Toledo, Boykin Nearing, MD;  Location: [facility];  Service: Gastroenterology;  Laterality: N/A;   ESOPHAGOGASTRODUODENOSCOPY (EGD) WITH PROPOFOL N/A 01/03/2019   Procedure: ESOPHAGOGASTRODUODENOSCOPY (EGD) WITH PROPOFOL;  Surgeon: Midge Minium, MD;  Location: [facility];  Service: Endoscopy;  Laterality: N/A;   ESOPHAGOGASTRODUODENOSCOPY (EGD) WITH PROPOFOL N/A 09/25/2021   Procedure: ESOPHAGOGASTRODUODENOSCOPY (EGD) WITH PROPOFOL;  Surgeon: Midge Minium, MD;  Location: [facility];  Service: Endoscopy;  Laterality: N/A;   GIVENS CAPSULE STUDY  04/16/2019   Procedure: GIVENS CAPSULE STUDY;  Surgeon: Virgel Manifold, MD;  Location: [facility];  Service: Endoscopy;;   GIVENS CAPSULE STUDY N/A 06/25/2019   Procedure: GIVENS CAPSULE STUDY;  Surgeon: Jonathon Bellows, MD;  Location: [facility];  Service: Gastroenterology;  Laterality: N/A;   LOWER EXTREMITY ANGIOGRAPHY Right 08/21/2018   Procedure: LOWER EXTREMITY ANGIOGRAPHY;  Surgeon: Algernon Huxley, MD;  Location: [place] [facility];  Service: Cardiovascular;  Laterality: Right;   LOWER EXTREMITY ANGIOGRAPHY Left 08/28/2018   Procedure: LOWER EXTREMITY ANGIOGRAPHY;  Surgeon: Algernon Huxley, MD;  Location: [place] [facility];  Service: Cardiovascular;  Laterality: Left;   LOWER EXTREMITY ANGIOGRAPHY Right 08/28/2018   Procedure: Lower Extremity Angiography;  Surgeon: Algernon Huxley, MD;  Location: [place] [facility];  Service: Cardiovascular;  Laterality: Right;   LOWER EXTREMITY ANGIOGRAPHY Right 12/18/2018   Procedure: Lower Extremity Angiography;  Surgeon: Algernon Huxley, MD;  Location: [place] [facility];  Service: Cardiovascular;  Laterality: Right;   LOWER EXTREMITY ANGIOGRAPHY Right 12/18/2018   Procedure: Lower Extremity Angiography;  Surgeon: Algernon Huxley, MD;  Location: [place] [facility];  Service: Cardiovascular;  Laterality: Right;   LOWER EXTREMITY ANGIOGRAPHY Left 12/21/2018   Procedure: Lower Extremity Angiography;  Surgeon: Algernon Huxley, MD;  Location: [place] [facility];  Service: Cardiovascular;  Laterality: Left;   LOWER EXTREMITY ANGIOGRAPHY Right 12/21/2018   Procedure: Lower Extremity Angiography;  Surgeon: Algernon Huxley, MD;  Location: [place] [facility];  Service: Cardiovascular;  Laterality: Right;   LOWER EXTREMITY ANGIOGRAPHY Left 12/25/2020   Procedure: LOWER EXTREMITY ANGIOGRAPHY;  Surgeon: Algernon Huxley, MD;  Location: [place] [facility];  Service: Cardiovascular;  Laterality: Left;   LOWER EXTREMITY ANGIOGRAPHY Left 09/21/2021   Procedure: Lower Extremity Angiography;  Surgeon: Algernon Huxley, MD;  Location: [place] [facility];  Service: Cardiovascular;  Laterality: Left;   LOWER EXTREMITY ANGIOGRAPHY Left 10/13/2021   Procedure: Lower Extremity Angiography;  Surgeon: Katha Cabal, MD;  Location: [place] [facility];  Service: Cardiovascular;  Laterality: Left;   LOWER EXTREMITY ANGIOGRAPHY Left 10/14/2021   Procedure: Lower Extremity Angiography;  Surgeon: Algernon Huxley, MD;  Location: [place] [facility];  Service: Cardiovascular;  Laterality: Left;   LOWER EXTREMITY ANGIOGRAPHY Left 11/02/2021   Procedure: Lower Extremity Angiography;  Surgeon: Algernon Huxley, MD;  Location: [place] [facility];  Service: Cardiovascular;  Laterality: Left;   LOWER EXTREMITY INTERVENTION N/A 12/22/2018   Procedure: LOWER EXTREMITY INTERVENTION;  Surgeon: Algernon Huxley, MD;  Location: [place] [facility];  Service: Cardiovascular;  Laterality: N/A;   LOWER EXTREMITY INTERVENTION Left 12/26/2020   Procedure: LOWER EXTREMITY INTERVENTION;  Surgeon: Algernon Huxley, MD;  Location: [place] [facility];  Service: Cardiovascular;  Laterality: Left;   LOWER EXTREMITY INTERVENTION N/A 09/22/2021   Procedure: LOWER EXTREMITY INTERVENTION;  Surgeon:  Algernon Huxley, MD;  Location: [place] [facility];  Service: Cardiovascular;  Laterality: N/A;    Family History  Problem Relation Age of Onset   Breast cancer Mother 63    Allergies  Allergen Reactions   Vancomycin Rash    Patient developed a rash to injection site and arm a few mintes after starting ABX.    Hydrocodone Rash   Metformin And Related Diarrhea   Penicillins Hives    Has patient had a PCN reaction causing immediate rash, facial/tongue/throat  swelling, SOB or lightheadedness with hypotension: Yes Has patient had a PCN reaction causing severe rash involving mucus membranes or skin necrosis: No Has patient had a PCN reaction that required hospitalization: No Has patient had a PCN reaction occurring within the last 10 years: No If all of the above answers are "NO", then may proceed with Cephalosporin use.   Tramadol Itching       Latest Ref Rng & Units 11/30/2021   10:50 AM 11/07/2021    8:08 PM 11/07/2021    4:15 AM  CBC  WBC 4.0 - 10.5 K/uL 8.5   7.6   Hemoglobin 12.0 - 15.0 g/dL 16.1  9.0  7.2   Hematocrit 36.0 - 46.0 % 30.0  27.0  22.3   Platelets 150 - 400 K/uL 486   337       CMP     Component Value Date/Time   NA 133 (L) 11/30/2021 1050   K 4.1 11/30/2021 1050   CL 98 11/30/2021 1050   CO2 21 (L) 11/30/2021 1050   GLUCOSE 322 (H) 11/30/2021 1050   BUN 25 (H) 11/30/2021 1050   CREATININE 0.97 11/30/2021 1050   CALCIUM 9.8 11/30/2021 1050   PROT 8.4 (H) 11/30/2021 1050   ALBUMIN 3.5 11/30/2021 1050   AST 14 (L) 11/30/2021 1050   ALT 11 11/30/2021 1050   ALKPHOS 123 11/30/2021 1050   BILITOT 0.3 11/30/2021 1050   GFRNONAA >60 11/30/2021 1050   GFRAA >60 11/23/2019 1505     No results found.     Assessment & Plan:   1. Hx of BKA, left (HCC) The patient tolerated stent removal well.  We will send in refill of pain medication.  We will begin with wet-to-dry wound dressings for her wounds.  We will have the patient return in 2 weeks for wound  reevaluation.   Current Outpatient Medications on File Prior to Visit  Medication Sig Dispense Refill   acetaminophen (TYLENOL) 325 MG tablet Take 650 mg by mouth every 6 (six) hours as needed.     albuterol (PROVENTIL HFA;VENTOLIN HFA) 108 (90 Base) MCG/ACT inhaler Inhale 2 puffs into the lungs every 6 (six) hours as needed for wheezing or shortness of breath. 1 Inhaler 2   apixaban (ELIQUIS) 5 MG TABS tablet Take 1 tablet (5 mg total) by mouth 2 (two) times daily. 60 tablet 12   aspirin 81 MG chewable tablet Chew 1 tablet (81 mg total) by mouth daily. 30 tablet 11   atorvastatin (LIPITOR) 10 MG tablet Take 10 mg by mouth at bedtime.     buPROPion (ZYBAN) 150 MG 12 hr tablet Take 150 mg by mouth 2 (two) times daily.     cetirizine (ZYRTEC) 10 MG tablet Take 10 mg by mouth daily as needed for allergies.     docusate sodium (COLACE) 100 MG capsule Take 100 mg by mouth daily.     DULoxetine (CYMBALTA) 60 MG capsule Take 1 capsule (60 mg total) by mouth daily. 30 capsule 3   famotidine (PEPCID) 20 MG tablet Take 1 tablet (20 mg total) by mouth at bedtime. 30 tablet 3   ferrous sulfate 325 (65 FE) MG tablet Take 325 mg by mouth daily.      folic acid (FOLVITE) 1 MG tablet Take 1 mg by mouth daily.     gabapentin (NEURONTIN) 300 MG capsule TAKE 3 CAPSULES BY MOUTH IN THE  MORNING AND 3 CAPSULES BY MOUTH  IN THE EVENING 450 capsule 3  hydroxychloroquine (PLAQUENIL) 200 MG tablet Take 200 mg by mouth daily.     JARDIANCE 10 MG TABS tablet Take 10 mg by mouth every morning.     lisinopril (ZESTRIL) 10 MG tablet Take 1 tablet (10 mg total) by mouth daily. (Patient taking differently: Take 10 mg by mouth every morning.) 30 tablet 1   LORazepam (ATIVAN) 0.5 MG tablet Take 1 tablet (0.5 mg total) by mouth every 8 (eight) hours as needed for anxiety. 30 tablet 0   Multiple Vitamins-Minerals (WOMENS MULTIVITAMIN PO) Take 1 tablet by mouth daily.     ondansetron (ZOFRAN-ODT) 4 MG disintegrating tablet Take  1 tablet (4 mg total) by mouth every 8 (eight) hours as needed for nausea or vomiting. 20 tablet 0   pantoprazole (PROTONIX) 40 MG tablet Take 1 tablet (40 mg total) by mouth daily. (Patient taking differently: Take 40 mg by mouth every morning.) 30 tablet 11   sitaGLIPtin (JANUVIA) 100 MG tablet Take 100 mg by mouth every morning.     sulfamethoxazole-trimethoprim (BACTRIM DS) 800-160 MG tablet Take 1 tablet by mouth 2 (two) times daily. 20 tablet 0   vitamin B-12 (CYANOCOBALAMIN) 500 MCG tablet Take 1 tablet (500 mcg total) by mouth daily. 90 tablet 1   No current facility-administered medications on file prior to visit.    There are no Patient Instructions on file for this visit. No follow-ups on file.   Georgiana Spinner, NP

## 2022-01-11 ENCOUNTER — Telehealth (INDEPENDENT_AMBULATORY_CARE_PROVIDER_SITE_OTHER): Payer: Self-pay

## 2022-01-18 ENCOUNTER — Ambulatory Visit (INDEPENDENT_AMBULATORY_CARE_PROVIDER_SITE_OTHER): Payer: Medicare Other | Admitting: Nurse Practitioner

## 2022-01-21 ENCOUNTER — Telehealth (INDEPENDENT_AMBULATORY_CARE_PROVIDER_SITE_OTHER): Payer: Self-pay

## 2022-01-22 ENCOUNTER — Other Ambulatory Visit (INDEPENDENT_AMBULATORY_CARE_PROVIDER_SITE_OTHER): Payer: Self-pay | Admitting: Nurse Practitioner

## 2022-01-22 MED ORDER — OXYCODONE HCL 15 MG PO TABS: 15.0000 mg | ORAL_TABLET | Freq: Four times a day (QID) | ORAL | 0 refills | Status: DC | PRN

## 2022-01-22 NOTE — Telephone Encounter (Signed)
sent 

## 2022-01-22 NOTE — Telephone Encounter (Signed)
Left message on patient voicemail.

## 2022-01-25 NOTE — Telephone Encounter (Signed)
I spoke with Collie Siad from [facility] and she will be sending picture. I also reach out to the patient and left a voicemail.

## 2022-01-25 NOTE — Telephone Encounter (Signed)
Can she send us a picture?

## 2022-02-02 ENCOUNTER — Encounter (INDEPENDENT_AMBULATORY_CARE_PROVIDER_SITE_OTHER): Payer: Self-pay | Admitting: Nurse Practitioner

## 2022-02-02 ENCOUNTER — Ambulatory Visit (INDEPENDENT_AMBULATORY_CARE_PROVIDER_SITE_OTHER): Payer: Medicare Other | Admitting: Nurse Practitioner

## 2022-02-02 VITALS — BP 189/87 | HR 101 | Resp 18 | Ht 67.0 in | Wt 131.0 lb

## 2022-02-02 DIAGNOSIS — Z89512 Acquired absence of left leg below knee: Secondary | ICD-10-CM

## 2022-02-04 ENCOUNTER — Telehealth (INDEPENDENT_AMBULATORY_CARE_PROVIDER_SITE_OTHER): Payer: Self-pay

## 2022-02-04 NOTE — Telephone Encounter (Signed)
I'd like to do medihoney in that area with [facility]

## 2022-02-05 NOTE — Telephone Encounter (Signed)
Sylvia [name] from [facility] was made aware with verbal orders

## 2022-02-08 ENCOUNTER — Encounter (INDEPENDENT_AMBULATORY_CARE_PROVIDER_SITE_OTHER): Payer: Self-pay | Admitting: Nurse Practitioner

## 2022-02-08 NOTE — Progress Notes (Signed)
Subjective:    Patient ID: Sylvia [name], female    DOB: [birth year], 66 y.o.   MRN: [id number] Chief Complaint  Patient presents with   Follow-up    2 weeks no studies    The patient returns today for follow-up evaluation of her left below-knee amputation site.  The wound incision is healing well except for a small anterior portion which has significant fibrinous exudate and a small piece of eschar that is still adherent.  The patient still continues to have significant pain in the lower extremity as well.    Review of Systems  Musculoskeletal:  Positive for gait problem.  Skin:  Positive for wound.  All other systems reviewed and are negative.      Objective:   Physical Exam Vitals reviewed.  HENT:     Head: Normocephalic.  Cardiovascular:     Rate and Rhythm: Normal rate.  Pulmonary:     Effort: Pulmonary effort is normal.  Musculoskeletal:     Right Lower Extremity: Right leg is amputated below knee.     Left Lower Extremity: Left leg is amputated below knee.  Skin:    General: Skin is warm and dry.  Neurological:     Mental Status: She is alert and oriented to person, place, and time.  Psychiatric:        Mood and Affect: Mood normal.        Behavior: Behavior normal.        Thought Content: Thought content normal.        Judgment: Judgment normal.     BP (!) 189/87 (BP Location: Right Arm)   Pulse (!) 101   Resp 18   Ht 5\' 7"  (1.702 m)   Wt 131 lb (59.4 kg)   BMI 20.52 kg/m   Past Medical History:  Diagnosis Date   Anemia of chronic disease 05/16/2019   Anxiety    h/o   Arthritis    Cocaine abuse (HCC)    COPD (chronic obstructive pulmonary disease) (HCC)    Depression    Diabetes mellitus without complication (HCC)    DKA (diabetic ketoacidosis) (HCC)    Elevated troponin    GERD (gastroesophageal reflux disease)    GI bleed    Hypertension    bp under control-off meds since 2019   Ischemic leg     Social History   Socioeconomic  History   Marital status: Divorced    Spouse name: Not on file   Number of children: 2   Years of education: Not on file   Highest education level: Not on file  Occupational History   Not on file  Tobacco Use   Smoking status: Former    Packs/day: 0.25    Years: 45.00    Total pack years: 11.25    Types: Cigarettes   Smokeless tobacco: Never   Tobacco comments:    quit  Vaping Use   Vaping Use: Never used  Substance and Sexual Activity   Alcohol use: No   Drug use: Not Currently    Types: Cocaine    Comment: last used in April 2021 per patient-last 2 drug tests have been negative for cocaine   Sexual activity: Not Currently  Other Topics Concern   Not on file  Social History Narrative   5 grandchildren and 5 great grandchildren    2 grandchildren live with Pt. (25 & 46 y.o.)   Social Determinants of Health   Financial Resource Strain: Low Risk  (06/14/2018)  Overall Financial Resource Strain (CARDIA)    Difficulty of Paying Living Expenses: Not hard at all  Food Insecurity: Food Insecurity Present (06/14/2018)   Hunger Vital Sign    Worried About Running Out of Food in the Last Year: Sometimes true    Ran Out of Food in the Last Year: Sometimes true  Transportation Needs: No Transportation Needs (06/14/2018)   PRAPARE - Hydrologist (Medical): No    Lack of Transportation (Non-Medical): No  Physical Activity: Insufficiently Active (06/14/2018)   Exercise Vital Sign    Days of Exercise per Week: 4 days    Minutes of Exercise per Session: 20 min  Stress: No Stress Concern Present (06/14/2018)   [place]    Feeling of Stress : Only a little  Social Connections: Unknown (06/14/2018)   Social Connection and Isolation Panel [NHANES]    Frequency of Communication with Friends and Family: Patient refused    Frequency of Social Gatherings with Friends and Family: Patient refused     Attends Religious Services: Patient refused    Active Member of Clubs or Organizations: Patient refused    Attends Archivist Meetings: Patient refused    Marital Status: Patient refused  Intimate Partner Violence: Unknown (06/14/2018)   Humiliation, Afraid, Rape, and Kick questionnaire    Fear of Current or Ex-Partner: Patient refused    Emotionally Abused: Patient refused    Physically Abused: Patient refused    Sexually Abused: Patient refused    Past Surgical History:  Procedure Laterality Date   ABDOMINAL HYSTERECTOMY     AMPUTATION Right 12/27/2018   Procedure: AMPUTATION BELOW KNEE;  Surgeon: Algernon Huxley, MD;  Location: [facility];  Service: General;  Laterality: Right;   AMPUTATION Left 11/05/2021   Procedure: AMPUTATION BELOW KNEE;  Surgeon: Algernon Huxley, MD;  Location: [facility];  Service: Vascular;  Laterality: Left;   APPLICATION OF WOUND VAC  10/16/2021   Procedure: APPLICATION OF WOUND VAC;  Surgeon: Algernon Huxley, MD;  Location: [facility];  Service: Vascular;;   CATARACT EXTRACTION W/PHACO Right 03/27/2020   Procedure: CATARACT EXTRACTION PHACO AND INTRAOCULAR LENS PLACEMENT ([place]) RIGHT DIABETIC 7.54 00:52.5;  Surgeon: Birder Robson, MD;  Location: [place];  Service: Ophthalmology;  Laterality: Right;   CATARACT EXTRACTION W/PHACO Left 05/20/2020   Procedure: CATARACT EXTRACTION PHACO AND INTRAOCULAR LENS PLACEMENT ([facility]) LEFT DIABETIC 4.95 00:37.6;  Surgeon: Birder Robson, MD;  Location: [place];  Service: Ophthalmology;  Laterality: Left;  Diabetic - oral meds COVID + 04-24-20   CHOLECYSTECTOMY     COLONOSCOPY WITH PROPOFOL N/A 04/16/2019   Procedure: COLONOSCOPY WITH PROPOFOL;  Surgeon: Virgel Manifold, MD;  Location: [facility];  Service: Endoscopy;  Laterality: N/A;   COLONOSCOPY WITH PROPOFOL N/A 01/16/2020   Procedure: COLONOSCOPY WITH PROPOFOL;  Surgeon: Virgel Manifold, MD;  Location: [facility];  Service: Endoscopy;   Laterality: N/A;   ENDARTERECTOMY FEMORAL Left 10/16/2021   Procedure: ENDARTERECTOMY FEMORAL;  Surgeon: Algernon Huxley, MD;  Location: [facility];  Service: Vascular;  Laterality: Left;   ESOPHAGOGASTRODUODENOSCOPY (EGD) WITH PROPOFOL N/A 06/15/2018   Procedure: ESOPHAGOGASTRODUODENOSCOPY (EGD) WITH PROPOFOL;  Surgeon: [place], Benay Pike, MD;  Location: [facility];  Service: Gastroenterology;  Laterality: N/A;   ESOPHAGOGASTRODUODENOSCOPY (EGD) WITH PROPOFOL N/A 01/03/2019   Procedure: ESOPHAGOGASTRODUODENOSCOPY (EGD) WITH PROPOFOL;  Surgeon: Lucilla Lame, MD;  Location: [facility];  Service: Endoscopy;  Laterality: N/A;  ESOPHAGOGASTRODUODENOSCOPY (EGD) WITH PROPOFOL N/A 09/25/2021   Procedure: ESOPHAGOGASTRODUODENOSCOPY (EGD) WITH PROPOFOL;  Surgeon: Midge Minium, MD;  Location: [facility];  Service: Endoscopy;  Laterality: N/A;   GIVENS CAPSULE STUDY  04/16/2019   Procedure: GIVENS CAPSULE STUDY;  Surgeon: Pasty Spillers, MD;  Location: [facility];  Service: Endoscopy;;   GIVENS CAPSULE STUDY N/A 06/25/2019   Procedure: GIVENS CAPSULE STUDY;  Surgeon: Wyline Mood, MD;  Location: [facility];  Service: Gastroenterology;  Laterality: N/A;   LOWER EXTREMITY ANGIOGRAPHY Right 08/21/2018   Procedure: LOWER EXTREMITY ANGIOGRAPHY;  Surgeon: Annice Needy, MD;  Location: [facility];  Service: Cardiovascular;  Laterality: Right;   LOWER EXTREMITY ANGIOGRAPHY Left 08/28/2018   Procedure: LOWER EXTREMITY ANGIOGRAPHY;  Surgeon: Annice Needy, MD;  Location: [facility];  Service: Cardiovascular;  Laterality: Left;   LOWER EXTREMITY ANGIOGRAPHY Right 08/28/2018   Procedure: Lower Extremity Angiography;  Surgeon: Annice Needy, MD;  Location: [facility];  Service: Cardiovascular;  Laterality: Right;   LOWER EXTREMITY ANGIOGRAPHY Right 12/18/2018   Procedure: Lower Extremity Angiography;  Surgeon: Annice Needy, MD;  Location: [facility];  Service: Cardiovascular;   Laterality: Right;   LOWER EXTREMITY ANGIOGRAPHY Right 12/18/2018   Procedure: Lower Extremity Angiography;  Surgeon: Annice Needy, MD;  Location: [facility];  Service: Cardiovascular;  Laterality: Right;   LOWER EXTREMITY ANGIOGRAPHY Left 12/21/2018   Procedure: Lower Extremity Angiography;  Surgeon: Annice Needy, MD;  Location: [facility];  Service: Cardiovascular;  Laterality: Left;   LOWER EXTREMITY ANGIOGRAPHY Right 12/21/2018   Procedure: Lower Extremity Angiography;  Surgeon: Annice Needy, MD;  Location: [facility];  Service: Cardiovascular;  Laterality: Right;   LOWER EXTREMITY ANGIOGRAPHY Left 12/25/2020   Procedure: LOWER EXTREMITY ANGIOGRAPHY;  Surgeon: Annice Needy, MD;  Location: [facility];  Service: Cardiovascular;  Laterality: Left;   LOWER EXTREMITY ANGIOGRAPHY Left 09/21/2021   Procedure: Lower Extremity Angiography;  Surgeon: Annice Needy, MD;  Location: [facility];  Service: Cardiovascular;  Laterality: Left;   LOWER EXTREMITY ANGIOGRAPHY Left 10/13/2021   Procedure: Lower Extremity Angiography;  Surgeon: Renford Dills, MD;  Location: [facility];  Service: Cardiovascular;  Laterality: Left;   LOWER EXTREMITY ANGIOGRAPHY Left 10/14/2021   Procedure: Lower Extremity Angiography;  Surgeon: Annice Needy, MD;  Location: [facility];  Service: Cardiovascular;  Laterality: Left;   LOWER EXTREMITY ANGIOGRAPHY Left 11/02/2021   Procedure: Lower Extremity Angiography;  Surgeon: Annice Needy, MD;  Location: [facility];  Service: Cardiovascular;  Laterality: Left;   LOWER EXTREMITY INTERVENTION N/A 12/22/2018   Procedure: LOWER EXTREMITY INTERVENTION;  Surgeon: Annice Needy, MD;  Location: [facility];  Service: Cardiovascular;  Laterality: N/A;   LOWER EXTREMITY INTERVENTION Left 12/26/2020   Procedure: LOWER EXTREMITY INTERVENTION;  Surgeon: Annice Needy, MD;  Location: [facility];  Service:  Cardiovascular;  Laterality: Left;   LOWER EXTREMITY INTERVENTION N/A 09/22/2021   Procedure: LOWER EXTREMITY INTERVENTION;  Surgeon: Annice Needy, MD;  Location: [facility];  Service: Cardiovascular;  Laterality: N/A;    Family History  Problem Relation Age of Onset   Breast cancer Mother 73    Allergies  Allergen Reactions   Vancomycin Rash    Patient developed a rash to injection site and arm a few mintes after starting ABX.    Hydrocodone Rash  Metformin And Related Diarrhea   Penicillins Hives    Has patient had a PCN reaction causing immediate rash, facial/tongue/throat swelling, SOB or lightheadedness with hypotension: Yes Has patient had a PCN reaction causing severe rash involving mucus membranes or skin necrosis: No Has patient had a PCN reaction that required hospitalization: No Has patient had a PCN reaction occurring within the last 10 years: No If all of the above answers are "NO", then may proceed with Cephalosporin use.   Tramadol Itching       Latest Ref Rng & Units 11/30/2021   10:50 AM 11/07/2021    8:08 PM 11/07/2021    4:15 AM  CBC  WBC 4.0 - 10.5 K/uL 8.5   7.6   Hemoglobin 12.0 - 15.0 g/dL 05.6  9.0  7.2   Hematocrit 36.0 - 46.0 % 30.0  27.0  22.3   Platelets 150 - 400 K/uL 486   337       CMP     Component Value Date/Time   NA 133 (L) 11/30/2021 1050   K 4.1 11/30/2021 1050   CL 98 11/30/2021 1050   CO2 21 (L) 11/30/2021 1050   GLUCOSE 322 (H) 11/30/2021 1050   BUN 25 (H) 11/30/2021 1050   CREATININE 0.97 11/30/2021 1050   CALCIUM 9.8 11/30/2021 1050   PROT 8.4 (H) 11/30/2021 1050   ALBUMIN 3.5 11/30/2021 1050   AST 14 (L) 11/30/2021 1050   ALT 11 11/30/2021 1050   ALKPHOS 123 11/30/2021 1050   BILITOT 0.3 11/30/2021 1050   GFRNONAA >60 11/30/2021 1050   GFRAA >60 11/23/2019 1505     No results found.     Assessment & Plan:   1. Hx of BKA, left (HCC) We will continue to have wound dressings done by home health for the  patient.  We will attempt to have him begin using Medihoney for debridement of the area.  Lower extremity to avoid surgical debridement if we can.  We will have patient return in 2 weeks for wound reevaluation.   Current Outpatient Medications on File Prior to Visit  Medication Sig Dispense Refill   acetaminophen (TYLENOL) 325 MG tablet Take 650 mg by mouth every 6 (six) hours as needed.     albuterol (PROVENTIL HFA;VENTOLIN HFA) 108 (90 Base) MCG/ACT inhaler Inhale 2 puffs into the lungs every 6 (six) hours as needed for wheezing or shortness of breath. 1 Inhaler 2   apixaban (ELIQUIS) 5 MG TABS tablet Take 1 tablet (5 mg total) by mouth 2 (two) times daily. 60 tablet 12   aspirin 81 MG chewable tablet Chew 1 tablet (81 mg total) by mouth daily. 30 tablet 11   atorvastatin (LIPITOR) 10 MG tablet Take 10 mg by mouth at bedtime.     buPROPion (ZYBAN) 150 MG 12 hr tablet Take 150 mg by mouth 2 (two) times daily.     cetirizine (ZYRTEC) 10 MG tablet Take 10 mg by mouth daily as needed for allergies.     docusate sodium (COLACE) 100 MG capsule Take 100 mg by mouth daily.     DULoxetine (CYMBALTA) 60 MG capsule Take 1 capsule (60 mg total) by mouth daily. 30 capsule 3   ferrous sulfate 325 (65 FE) MG tablet Take 325 mg by mouth daily.      folic acid (FOLVITE) 1 MG tablet Take 1 mg by mouth daily.     gabapentin (NEURONTIN) 300 MG capsule TAKE 3 CAPSULES BY MOUTH IN THE  MORNING AND  3 CAPSULES BY MOUTH  IN THE EVENING 450 capsule 3   hydroxychloroquine (PLAQUENIL) 200 MG tablet Take 200 mg by mouth daily.     JARDIANCE 10 MG TABS tablet Take 10 mg by mouth every morning.     lisinopril (ZESTRIL) 10 MG tablet Take 1 tablet (10 mg total) by mouth daily. (Patient taking differently: Take 10 mg by mouth every morning.) 30 tablet 1   LORazepam (ATIVAN) 0.5 MG tablet Take 1 tablet (0.5 mg total) by mouth every 8 (eight) hours as needed for anxiety. 30 tablet 0   Multiple Vitamins-Minerals (WOMENS  MULTIVITAMIN PO) Take 1 tablet by mouth daily.     ondansetron (ZOFRAN-ODT) 4 MG disintegrating tablet Take 1 tablet (4 mg total) by mouth every 8 (eight) hours as needed for nausea or vomiting. 20 tablet 0   oxyCODONE (ROXICODONE) 15 MG immediate release tablet Take 1-2 tablets (15-30 mg total) by mouth every 6 (six) hours as needed for pain. 30 tablet 0   pantoprazole (PROTONIX) 40 MG tablet Take 1 tablet (40 mg total) by mouth daily. (Patient taking differently: Take 40 mg by mouth every morning.) 30 tablet 11   sitaGLIPtin (JANUVIA) 100 MG tablet Take 100 mg by mouth every morning.     sulfamethoxazole-trimethoprim (BACTRIM DS) 800-160 MG tablet Take 1 tablet by mouth 2 (two) times daily. 20 tablet 0   VICTOZA 18 MG/3ML SOPN Inject into the skin.     vitamin B-12 (CYANOCOBALAMIN) 500 MCG tablet Take 1 tablet (500 mcg total) by mouth daily. 90 tablet 1   famotidine (PEPCID) 20 MG tablet Take 1 tablet (20 mg total) by mouth at bedtime. 30 tablet 3   No current facility-administered medications on file prior to visit.    There are no Patient Instructions on file for this visit. No follow-ups on file.   Georgiana Spinner, NP

## 2022-02-15 ENCOUNTER — Telehealth (INDEPENDENT_AMBULATORY_CARE_PROVIDER_SITE_OTHER): Payer: Self-pay

## 2022-02-15 ENCOUNTER — Other Ambulatory Visit (INDEPENDENT_AMBULATORY_CARE_PROVIDER_SITE_OTHER): Payer: Self-pay | Admitting: Nurse Practitioner

## 2022-02-15 ENCOUNTER — Encounter (INDEPENDENT_AMBULATORY_CARE_PROVIDER_SITE_OTHER): Payer: Self-pay

## 2022-02-15 MED ORDER — OXYCODONE HCL 15 MG PO TABS: 15.0000 mg | ORAL_TABLET | Freq: Four times a day (QID) | ORAL | 0 refills | Status: DC | PRN

## 2022-02-15 NOTE — Telephone Encounter (Signed)
Sylvia [name] with [facility] calls stating concerns about sloth and wanting to change treatment to santyl ointment and also pt needs pain meds.  Please advise

## 2022-02-15 NOTE — Telephone Encounter (Signed)
I think santyl would be a good choice however santyl is typically an expensive option and there is concern if the patient would be able to afford.  I can resend pain meds

## 2022-02-16 ENCOUNTER — Ambulatory Visit (INDEPENDENT_AMBULATORY_CARE_PROVIDER_SITE_OTHER): Payer: Medicare Other | Admitting: Nurse Practitioner

## 2022-02-16 ENCOUNTER — Other Ambulatory Visit (INDEPENDENT_AMBULATORY_CARE_PROVIDER_SITE_OTHER): Payer: Self-pay | Admitting: Nurse Practitioner

## 2022-02-16 ENCOUNTER — Encounter (INDEPENDENT_AMBULATORY_CARE_PROVIDER_SITE_OTHER): Payer: Self-pay | Admitting: Nurse Practitioner

## 2022-02-16 VITALS — BP 157/73 | HR 92 | Resp 17

## 2022-02-16 DIAGNOSIS — Z89512 Acquired absence of left leg below knee: Secondary | ICD-10-CM

## 2022-02-16 MED ORDER — SULFAMETHOXAZOLE-TRIMETHOPRIM 800-160 MG PO TABS: 1.0000 | ORAL_TABLET | Freq: Two times a day (BID) | ORAL | 0 refills | Status: DC

## 2022-02-16 MED ORDER — COLLAGENASE 250 UNIT/GM EX OINT: 1.0000 | TOPICAL_OINTMENT | Freq: Every day | CUTANEOUS | 3 refills | Status: DC

## 2022-02-16 NOTE — Telephone Encounter (Signed)
Please speak with pt about the santyl today at her appt per Izora Gala at [facility]

## 2022-02-16 NOTE — Telephone Encounter (Signed)
Please fax new orders for today's visit to [phone] to [facility]

## 2022-02-19 LAB — AEROBIC CULTURE

## 2022-02-22 ENCOUNTER — Other Ambulatory Visit: Payer: Self-pay

## 2022-02-22 ENCOUNTER — Encounter (INDEPENDENT_AMBULATORY_CARE_PROVIDER_SITE_OTHER): Payer: Self-pay | Admitting: Nurse Practitioner

## 2022-02-22 ENCOUNTER — Inpatient Hospital Stay
Admission: EM | Admit: 2022-02-22 | Discharge: 2022-02-25 | DRG: 463 | Disposition: A | Discharge status: Home Health | Payer: Medicare Other | Attending: Internal Medicine | Admitting: Internal Medicine

## 2022-02-22 ENCOUNTER — Emergency Department: Payer: Medicare Other

## 2022-02-22 DIAGNOSIS — Z90710 Acquired absence of both cervix and uterus: Secondary | ICD-10-CM

## 2022-02-22 DIAGNOSIS — T8744 Infection of amputation stump, left lower extremity: Secondary | ICD-10-CM | POA: Diagnosis not present

## 2022-02-22 DIAGNOSIS — F419 Anxiety disorder, unspecified: Secondary | ICD-10-CM | POA: Diagnosis present

## 2022-02-22 DIAGNOSIS — Z79899 Other long term (current) drug therapy: Secondary | ICD-10-CM

## 2022-02-22 DIAGNOSIS — D638 Anemia in other chronic diseases classified elsewhere: Secondary | ICD-10-CM | POA: Diagnosis present

## 2022-02-22 DIAGNOSIS — Z9842 Cataract extraction status, left eye: Secondary | ICD-10-CM

## 2022-02-22 DIAGNOSIS — A419 Sepsis, unspecified organism: Secondary | ICD-10-CM

## 2022-02-22 DIAGNOSIS — A4189 Other specified sepsis: Secondary | ICD-10-CM | POA: Diagnosis present

## 2022-02-22 DIAGNOSIS — E1142 Type 2 diabetes mellitus with diabetic polyneuropathy: Secondary | ICD-10-CM

## 2022-02-22 DIAGNOSIS — L03116 Cellulitis of left lower limb: Secondary | ICD-10-CM | POA: Diagnosis present

## 2022-02-22 DIAGNOSIS — Z89511 Acquired absence of right leg below knee: Secondary | ICD-10-CM

## 2022-02-22 DIAGNOSIS — E1151 Type 2 diabetes mellitus with diabetic peripheral angiopathy without gangrene: Secondary | ICD-10-CM | POA: Diagnosis present

## 2022-02-22 DIAGNOSIS — Z961 Presence of intraocular lens: Secondary | ICD-10-CM | POA: Diagnosis present

## 2022-02-22 DIAGNOSIS — I10 Essential (primary) hypertension: Secondary | ICD-10-CM | POA: Insufficient documentation

## 2022-02-22 DIAGNOSIS — Z9841 Cataract extraction status, right eye: Secondary | ICD-10-CM

## 2022-02-22 DIAGNOSIS — B9561 Methicillin susceptible Staphylococcus aureus infection as the cause of diseases classified elsewhere: Secondary | ICD-10-CM | POA: Diagnosis present

## 2022-02-22 DIAGNOSIS — K219 Gastro-esophageal reflux disease without esophagitis: Secondary | ICD-10-CM | POA: Diagnosis present

## 2022-02-22 DIAGNOSIS — Z9049 Acquired absence of other specified parts of digestive tract: Secondary | ICD-10-CM

## 2022-02-22 DIAGNOSIS — Z8616 Personal history of COVID-19: Secondary | ICD-10-CM

## 2022-02-22 DIAGNOSIS — Y835 Amputation of limb(s) as the cause of abnormal reaction of the patient, or of later complication, without mention of misadventure at the time of the procedure: Secondary | ICD-10-CM | POA: Diagnosis present

## 2022-02-22 DIAGNOSIS — T8781 Dehiscence of amputation stump: Secondary | ICD-10-CM | POA: Diagnosis present

## 2022-02-22 DIAGNOSIS — M069 Rheumatoid arthritis, unspecified: Secondary | ICD-10-CM | POA: Insufficient documentation

## 2022-02-22 DIAGNOSIS — Z87891 Personal history of nicotine dependence: Secondary | ICD-10-CM

## 2022-02-22 DIAGNOSIS — Z79631 Long term (current) use of antimetabolite agent: Secondary | ICD-10-CM

## 2022-02-22 DIAGNOSIS — Z880 Allergy status to penicillin: Secondary | ICD-10-CM

## 2022-02-22 DIAGNOSIS — F32A Depression, unspecified: Secondary | ICD-10-CM | POA: Diagnosis present

## 2022-02-22 DIAGNOSIS — E1165 Type 2 diabetes mellitus with hyperglycemia: Secondary | ICD-10-CM | POA: Diagnosis present

## 2022-02-22 DIAGNOSIS — M1990 Unspecified osteoarthritis, unspecified site: Secondary | ICD-10-CM | POA: Diagnosis present

## 2022-02-22 DIAGNOSIS — Z885 Allergy status to narcotic agent status: Secondary | ICD-10-CM

## 2022-02-22 DIAGNOSIS — J449 Chronic obstructive pulmonary disease, unspecified: Secondary | ICD-10-CM | POA: Diagnosis present

## 2022-02-22 DIAGNOSIS — E785 Hyperlipidemia, unspecified: Secondary | ICD-10-CM | POA: Insufficient documentation

## 2022-02-22 DIAGNOSIS — Z803 Family history of malignant neoplasm of breast: Secondary | ICD-10-CM

## 2022-02-22 DIAGNOSIS — Z5189 Encounter for other specified aftercare: Secondary | ICD-10-CM

## 2022-02-22 LAB — BASIC METABOLIC PANEL
Anion gap: 9 (ref 5–15)
BUN: 15 mg/dL (ref 8–23)
CO2: 21 mmol/L — ABNORMAL LOW (ref 22–32)
Calcium: 9.6 mg/dL (ref 8.9–10.3)
Chloride: 110 mmol/L (ref 98–111)
Creatinine, Ser: 1.03 mg/dL — ABNORMAL HIGH (ref 0.44–1.00)
GFR, Estimated: 60 mL/min — ABNORMAL LOW (ref >60)
Glucose, Bld: 180 mg/dL — ABNORMAL HIGH (ref 70–99)
Potassium: 4.1 mmol/L (ref 3.5–5.1)
Sodium: 140 mmol/L (ref 135–145)

## 2022-02-22 LAB — CBC
HCT: 33.2 % — ABNORMAL LOW (ref 36.0–46.0)
Hemoglobin: 11.4 g/dL — ABNORMAL LOW (ref 12.0–15.0)
MCH: 27.6 pg (ref 26.0–34.0)
MCHC: 34.3 g/dL (ref 30.0–36.0)
MCV: 80.4 fL (ref 80.0–100.0)
Platelets: 547 10*3/uL — ABNORMAL HIGH (ref 150–400)
RBC: 4.13 MIL/uL (ref 3.87–5.11)
RDW: 14.6 % (ref 11.5–15.5)
WBC: 8.7 10*3/uL (ref 4.0–10.5)
nRBC: 0 % (ref 0.0–0.2)

## 2022-02-22 MED ORDER — CLINDAMYCIN PHOSPHATE 900 MG/50ML IV SOLN
900.0000 mg | Freq: Once | INTRAVENOUS | Status: AC
  Administered 2022-02-23: 900 mg via INTRAVENOUS
  Filled 2022-02-22: qty 50

## 2022-02-22 MED ORDER — LINEZOLID 600 MG/300ML IV SOLN: 600.0000 mg | Freq: Once | INTRAVENOUS | Status: DC

## 2022-02-22 NOTE — ED Triage Notes (Signed)
Pt presents via POV c/o BKA to left leg on 11/06/21. Pt reports "foul odor" from stump. Reports drainage. Denies fevers.

## 2022-02-22 NOTE — Progress Notes (Signed)
Subjective:    Patient ID: Sylvia [name], female    DOB: 1955/12/10, 67 y.o.   MRN: [id number] No chief complaint on file.   HPI  Review of Systems  Skin:  Positive for wound.  All other systems reviewed and are negative.      Objective:   Physical Exam Vitals reviewed.  HENT:     Head: Normocephalic.  Cardiovascular:     Rate and Rhythm: Normal rate.  Pulmonary:     Effort: Pulmonary effort is normal.  Skin:    General: Skin is warm and dry.  Neurological:     Mental Status: She is alert and oriented to person, place, and time.     Gait: Gait abnormal.  Psychiatric:        Mood and Affect: Mood normal.        Behavior: Behavior normal.        Thought Content: Thought content normal.        Judgment: Judgment normal.     BP (!) 157/73 (BP Location: Right Arm)   Pulse 92   Resp 17   Past Medical History:  Diagnosis Date   Anemia of chronic disease 05/16/2019   Anxiety    h/o   Arthritis    Cocaine abuse (HCC)    COPD (chronic obstructive pulmonary disease) (HCC)    Depression    Diabetes mellitus without complication (HCC)    DKA (diabetic ketoacidosis) (HCC)    Elevated troponin    GERD (gastroesophageal reflux disease)    GI bleed    Hypertension    bp under control-off meds since 2019   Ischemic leg     Social History   Socioeconomic History   Marital status: Divorced    Spouse name: Not on file   Number of children: 2   Years of education: Not on file   Highest education level: Not on file  Occupational History   Not on file  Tobacco Use   Smoking status: Former    Packs/day: 0.25    Years: 45.00    Total pack years: 11.25    Types: Cigarettes   Smokeless tobacco: Never   Tobacco comments:    quit  Vaping Use   Vaping Use: Never used  Substance and Sexual Activity   Alcohol use: No   Drug use: Not Currently    Types: Cocaine    Comment: last used in April 2021 per patient-last 2 drug tests have been negative for cocaine    Sexual activity: Not Currently  Other Topics Concern   Not on file  Social History Narrative   5 grandchildren and 5 great grandchildren    2 grandchildren live with Pt. (25 & 8 y.o.)   Social Determinants of Health   Financial Resource Strain: Low Risk  (06/14/2018)   Overall Financial Resource Strain (CARDIA)    Difficulty of Paying Living Expenses: Not hard at all  Food Insecurity: Food Insecurity Present (06/14/2018)   Hunger Vital Sign    Worried About Running Out of Food in the Last Year: Sometimes true    Ran Out of Food in the Last Year: Sometimes true  Transportation Needs: No Transportation Needs (06/14/2018)   PRAPARE - Administrator, Civil Service (Medical): No    Lack of Transportation (Non-Medical): No  Physical Activity: Insufficiently Active (06/14/2018)   Exercise Vital Sign    Days of Exercise per Week: 4 days    Minutes of Exercise per Session: 20  min  Stress: No Stress Concern Present (06/14/2018)   [employer] of Occupational Health - Occupational Stress Questionnaire    Feeling of Stress : Only a little  Social Connections: Unknown (06/14/2018)   Social Connection and Isolation Panel [NHANES]    Frequency of Communication with Friends and Family: Patient refused    Frequency of Social Gatherings with Friends and Family: Patient refused    Attends Religious Services: Patient refused    Active Member of Clubs or Organizations: Patient refused    Attends Banker Meetings: Patient refused    Marital Status: Patient refused  Intimate Partner Violence: Unknown (06/14/2018)   Humiliation, Afraid, Rape, and Kick questionnaire    Fear of Current or Ex-Partner: Patient refused    Emotionally Abused: Patient refused    Physically Abused: Patient refused    Sexually Abused: Patient refused    Past Surgical History:  Procedure Laterality Date   ABDOMINAL HYSTERECTOMY     AMPUTATION Right 12/27/2018   Procedure: AMPUTATION BELOW KNEE;   Surgeon: Annice Needy, MD;  Location: [facility];  Service: General;  Laterality: Right;   AMPUTATION Left 11/05/2021   Procedure: AMPUTATION BELOW KNEE;  Surgeon: Annice Needy, MD;  Location: [facility];  Service: Vascular;  Laterality: Left;   APPLICATION OF WOUND VAC  10/16/2021   Procedure: APPLICATION OF WOUND VAC;  Surgeon: Annice Needy, MD;  Location: [facility];  Service: Vascular;;   CATARACT EXTRACTION W/PHACO Right 03/27/2020   Procedure: CATARACT EXTRACTION PHACO AND INTRAOCULAR LENS PLACEMENT (IOC) RIGHT DIABETIC 7.54 00:52.5;  Surgeon: Galen Manila, MD;  Location: [facility];  Service: Ophthalmology;  Laterality: Right;   CATARACT EXTRACTION W/PHACO Left 05/20/2020   Procedure: CATARACT EXTRACTION PHACO AND INTRAOCULAR LENS PLACEMENT (IOC) LEFT DIABETIC 4.95 00:37.6;  Surgeon: Galen Manila, MD;  Location: [facility];  Service: Ophthalmology;  Laterality: Left;  Diabetic - oral meds COVID + 04-24-20   CHOLECYSTECTOMY     COLONOSCOPY WITH PROPOFOL N/A 04/16/2019   Procedure: COLONOSCOPY WITH PROPOFOL;  Surgeon: Pasty Spillers, MD;  Location: [facility];  Service: Endoscopy;  Laterality: N/A;   COLONOSCOPY WITH PROPOFOL N/A 01/16/2020   Procedure: COLONOSCOPY WITH PROPOFOL;  Surgeon: Pasty Spillers, MD;  Location: [facility];  Service: Endoscopy;  Laterality: N/A;   ENDARTERECTOMY FEMORAL Left 10/16/2021   Procedure: ENDARTERECTOMY FEMORAL;  Surgeon: Annice Needy, MD;  Location: [facility];  Service: Vascular;  Laterality: Left;   ESOPHAGOGASTRODUODENOSCOPY (EGD) WITH PROPOFOL N/A 06/15/2018   Procedure: ESOPHAGOGASTRODUODENOSCOPY (EGD) WITH PROPOFOL;  Surgeon: Toledo, Boykin Nearing, MD;  Location: [facility];  Service: Gastroenterology;  Laterality: N/A;   ESOPHAGOGASTRODUODENOSCOPY (EGD) WITH PROPOFOL N/A 01/03/2019   Procedure: ESOPHAGOGASTRODUODENOSCOPY (EGD) WITH PROPOFOL;  Surgeon: Midge Minium, MD;  Location: [facility];  Service: Endoscopy;   Laterality: N/A;   ESOPHAGOGASTRODUODENOSCOPY (EGD) WITH PROPOFOL N/A 09/25/2021   Procedure: ESOPHAGOGASTRODUODENOSCOPY (EGD) WITH PROPOFOL;  Surgeon: Midge Minium, MD;  Location: [facility];  Service: Endoscopy;  Laterality: N/A;   GIVENS CAPSULE STUDY  04/16/2019   Procedure: GIVENS CAPSULE STUDY;  Surgeon: Pasty Spillers, MD;  Location: [facility];  Service: Endoscopy;;   GIVENS CAPSULE STUDY N/A 06/25/2019   Procedure: GIVENS CAPSULE STUDY;  Surgeon: Wyline Mood, MD;  Location: [facility];  Service: Gastroenterology;  Laterality: N/A;   LOWER EXTREMITY ANGIOGRAPHY Right 08/21/2018   Procedure: LOWER EXTREMITY ANGIOGRAPHY;  Surgeon: Annice Needy, MD;  Location: [facility];  Service: Cardiovascular;  Laterality: Right;  LOWER EXTREMITY ANGIOGRAPHY Left 08/28/2018   Procedure: LOWER EXTREMITY ANGIOGRAPHY;  Surgeon: Algernon Huxley, MD;  Location: [place] [facility];  Service: Cardiovascular;  Laterality: Left;   LOWER EXTREMITY ANGIOGRAPHY Right 08/28/2018   Procedure: Lower Extremity Angiography;  Surgeon: Algernon Huxley, MD;  Location: [place] [facility];  Service: Cardiovascular;  Laterality: Right;   LOWER EXTREMITY ANGIOGRAPHY Right 12/18/2018   Procedure: Lower Extremity Angiography;  Surgeon: Algernon Huxley, MD;  Location: [place] [facility];  Service: Cardiovascular;  Laterality: Right;   LOWER EXTREMITY ANGIOGRAPHY Right 12/18/2018   Procedure: Lower Extremity Angiography;  Surgeon: Algernon Huxley, MD;  Location: [place] [facility];  Service: Cardiovascular;  Laterality: Right;   LOWER EXTREMITY ANGIOGRAPHY Left 12/21/2018   Procedure: Lower Extremity Angiography;  Surgeon: Algernon Huxley, MD;  Location: [place] [facility];  Service: Cardiovascular;  Laterality: Left;   LOWER EXTREMITY ANGIOGRAPHY Right 12/21/2018   Procedure: Lower Extremity Angiography;  Surgeon: Algernon Huxley, MD;  Location: [place] [facility];  Service: Cardiovascular;  Laterality: Right;   LOWER  EXTREMITY ANGIOGRAPHY Left 12/25/2020   Procedure: LOWER EXTREMITY ANGIOGRAPHY;  Surgeon: Algernon Huxley, MD;  Location: [place] [facility];  Service: Cardiovascular;  Laterality: Left;   LOWER EXTREMITY ANGIOGRAPHY Left 09/21/2021   Procedure: Lower Extremity Angiography;  Surgeon: Algernon Huxley, MD;  Location: [place] [facility];  Service: Cardiovascular;  Laterality: Left;   LOWER EXTREMITY ANGIOGRAPHY Left 10/13/2021   Procedure: Lower Extremity Angiography;  Surgeon: Katha Cabal, MD;  Location: [place] [facility];  Service: Cardiovascular;  Laterality: Left;   LOWER EXTREMITY ANGIOGRAPHY Left 10/14/2021   Procedure: Lower Extremity Angiography;  Surgeon: Algernon Huxley, MD;  Location: [place] [facility];  Service: Cardiovascular;  Laterality: Left;   LOWER EXTREMITY ANGIOGRAPHY Left 11/02/2021   Procedure: Lower Extremity Angiography;  Surgeon: Algernon Huxley, MD;  Location: [place] [facility];  Service: Cardiovascular;  Laterality: Left;   LOWER EXTREMITY INTERVENTION N/A 12/22/2018   Procedure: LOWER EXTREMITY INTERVENTION;  Surgeon: Algernon Huxley, MD;  Location: [place] [facility];  Service: Cardiovascular;  Laterality: N/A;   LOWER EXTREMITY INTERVENTION Left 12/26/2020   Procedure: LOWER EXTREMITY INTERVENTION;  Surgeon: Algernon Huxley, MD;  Location: [place] [facility];  Service: Cardiovascular;  Laterality: Left;   LOWER EXTREMITY INTERVENTION N/A 09/22/2021   Procedure: LOWER EXTREMITY INTERVENTION;  Surgeon: Algernon Huxley, MD;  Location: [place] [facility];  Service: Cardiovascular;  Laterality: N/A;    Family History  Problem Relation Age of Onset   Breast cancer Mother 32    Allergies  Allergen Reactions   Vancomycin Rash    Patient developed a rash to injection site and arm a few mintes after starting ABX.    Hydrocodone Rash   Metformin And Related Diarrhea   Penicillins Hives    Has patient had a PCN reaction causing immediate rash, facial/tongue/throat swelling, SOB  or lightheadedness with hypotension: Yes Has patient had a PCN reaction causing severe rash involving mucus membranes or skin necrosis: No Has patient had a PCN reaction that required hospitalization: No Has patient had a PCN reaction occurring within the last 10 years: No If all of the above answers are "NO", then may proceed with Cephalosporin use.   Tramadol Itching       Latest Ref Rng & Units 11/30/2021   10:50 AM 11/07/2021    8:08 PM 11/07/2021    4:15 AM  CBC  WBC 4.0 - 10.5 K/uL 8.5   7.6   Hemoglobin 12.0 - 15.0 g/dL 35.5  9.0  7.2   Hematocrit 36.0 - 46.0 % 30.0  27.0  22.3   Platelets 150 - 400 K/uL 486   337       CMP     Component Value Date/Time   NA 133 (L) 11/30/2021 1050   K 4.1 11/30/2021 1050   CL 98 11/30/2021 1050   CO2 21 (L) 11/30/2021 1050   GLUCOSE 322 (H) 11/30/2021 1050   BUN 25 (H) 11/30/2021 1050   CREATININE 0.97 11/30/2021 1050   CALCIUM 9.8 11/30/2021 1050   PROT 8.4 (H) 11/30/2021 1050   ALBUMIN 3.5 11/30/2021 1050   AST 14 (L) 11/30/2021 1050   ALT 11 11/30/2021 1050   ALKPHOS 123 11/30/2021 1050   BILITOT 0.3 11/30/2021 1050   GFRNONAA >60 11/30/2021 1050   GFRAA >60 11/23/2019 1505     No results found.     Assessment & Plan:   1. Hx of BKA, left (HCC) The patient has chronic copious drainage from her wound.  We will culture the wound area.  We will stated back to me to review the results of packed.  We will also prescribe Santyl.  We discussed that there may be a cost issue as it can be an expensive medication.  If so, patient is to contact us for discussion about treatment alternatives.   Current Outpatient Medications on File Prior to Visit  Medication Sig Dispense Refill   acetaminophen (TYLENOL) 325 MG tablet Take 650 mg by mouth every 6 (six) hours as needed.     albuterol (PROVENTIL HFA;VENTOLIN HFA) 108 (90 Base) MCG/ACT inhaler Inhale 2 puffs into the lungs every 6 (six) hours as needed for wheezing or shortness of  breath. 1 Inhaler 2   apixaban (ELIQUIS) 5 MG TABS tablet Take 1 tablet (5 mg total) by mouth 2 (two) times daily. 60 tablet 12   aspirin 81 MG chewable tablet Chew 1 tablet (81 mg total) by mouth daily. 30 tablet 11   atorvastatin (LIPITOR) 10 MG tablet Take 10 mg by mouth at bedtime.     buPROPion (ZYBAN) 150 MG 12 hr tablet Take 150 mg by mouth 2 (two) times daily.     cetirizine (ZYRTEC) 10 MG tablet Take 10 mg by mouth daily as needed for allergies.     docusate sodium (COLACE) 100 MG capsule Take 100 mg by mouth daily.     DULoxetine (CYMBALTA) 60 MG capsule Take 1 capsule (60 mg total) by mouth daily. 30 capsule 3   ferrous sulfate 325 (65 FE) MG tablet Take 325 mg by mouth daily.      folic acid (FOLVITE) 1 MG tablet Take 1 mg by mouth daily.     gabapentin (NEURONTIN) 300 MG capsule TAKE 3 CAPSULES BY MOUTH IN THE  MORNING AND 3 CAPSULES BY MOUTH  IN THE EVENING 450 capsule 3   hydroxychloroquine (PLAQUENIL) 200 MG tablet Take 200 mg by mouth daily.     JARDIANCE 10 MG TABS tablet Take 10 mg by mouth every morning.     lisinopril (ZESTRIL) 10 MG tablet Take 1 tablet (10 mg total) by mouth daily. (Patient taking differently: Take 10 mg by mouth every morning.) 30 tablet 1   LORazepam (ATIVAN) 0.5 MG tablet Take 1 tablet (0.5 mg total) by mouth every 8 (eight) hours as needed for anxiety. 30 tablet 0   Multiple  Vitamins-Minerals (WOMENS MULTIVITAMIN PO) Take 1 tablet by mouth daily.     ondansetron (ZOFRAN-ODT) 4 MG disintegrating tablet Take 1 tablet (4 mg total) by mouth every 8 (eight) hours as needed for nausea or vomiting. 20 tablet 0   oxyCODONE (ROXICODONE) 15 MG immediate release tablet Take 1-2 tablets (15-30 mg total) by mouth every 6 (six) hours as needed for pain. 30 tablet 0   pantoprazole (PROTONIX) 40 MG tablet Take 1 tablet (40 mg total) by mouth daily. (Patient taking differently: Take 40 mg by mouth every morning.) 30 tablet 11   sitaGLIPtin (JANUVIA) 100 MG tablet Take  100 mg by mouth every morning.     VICTOZA 18 MG/3ML SOPN Inject into the skin.     vitamin B-12 (CYANOCOBALAMIN) 500 MCG tablet Take 1 tablet (500 mcg total) by mouth daily. 90 tablet 1   famotidine (PEPCID) 20 MG tablet Take 1 tablet (20 mg total) by mouth at bedtime. 30 tablet 3   No current facility-administered medications on file prior to visit.    There are no Patient Instructions on file for this visit. No follow-ups on file.   Georgiana Spinner, NP

## 2022-02-22 NOTE — ED Notes (Signed)
Pt's left BKA incision appears to have some yellow thick foul smelling discharge. Incision is relatively closed except for a 2cmx2cm opening. Pt has been dealing with chronic wound dehiscence.

## 2022-02-22 NOTE — ED Provider Notes (Addendum)
[facility] Provider Note    Event Date/Time   First MD Initiated Contact with Patient 02/22/22 2258     (approximate)   History   Wound Check   HPI  Sylvia [name] is a 66 y.o. 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 Lucky Cowboy with chronic nonhealing wound who presents to the emergency department with complaints of wound still not healing to the distal left BKA stump.  I she had recent office visit with vascular surgery on October 31.  At that time she did have a wound culture that showed Staph aureus and she was started on Bactrim which she states she is still taking.  She feels like the wound has not been getting any better and feels like it is getting slightly worse.  No fever.  States she does have some pain but takes oxycodone at home.  She was given a prescription for Santyl but states it was very expensive.  She has not seen wound care.   History provided by patient and significant other.    Past Medical History:  Diagnosis Date   Anemia of chronic disease 05/16/2019   Anxiety    h/o   Arthritis    Cocaine abuse ([place])    COPD (chronic obstructive pulmonary disease) (HCC)    Depression    Diabetes mellitus without complication (HCC)    DKA (diabetic ketoacidosis) (HCC)    Elevated troponin    GERD (gastroesophageal reflux disease)    GI bleed    Hypertension    bp under control-off meds since 2019   Ischemic leg     Past Surgical History:  Procedure Laterality Date   ABDOMINAL HYSTERECTOMY     AMPUTATION Right 12/27/2018   Procedure: AMPUTATION BELOW KNEE;  Surgeon: Algernon Huxley, MD;  Location: [facility];  Service: General;  Laterality: Right;   AMPUTATION Left 11/05/2021   Procedure: AMPUTATION BELOW KNEE;  Surgeon: Algernon Huxley, MD;  Location: [facility];  Service: Vascular;   Laterality: Left;   APPLICATION OF WOUND VAC  10/16/2021   Procedure: APPLICATION OF WOUND VAC;  Surgeon: Algernon Huxley, MD;  Location: [facility];  Service: Vascular;;   CATARACT EXTRACTION W/PHACO Right 03/27/2020   Procedure: CATARACT EXTRACTION PHACO AND INTRAOCULAR LENS PLACEMENT ([place]) RIGHT DIABETIC 7.54 00:52.5;  Surgeon: Birder Robson, MD;  Location: [place];  Service: Ophthalmology;  Laterality: Right;   CATARACT EXTRACTION W/PHACO Left 05/20/2020   Procedure: CATARACT EXTRACTION PHACO AND INTRAOCULAR LENS PLACEMENT ([facility]) LEFT DIABETIC 4.95 00:37.6;  Surgeon: Birder Robson, MD;  Location: [place];  Service: Ophthalmology;  Laterality: Left;  Diabetic - oral meds COVID + 04-24-20   CHOLECYSTECTOMY     COLONOSCOPY WITH PROPOFOL N/A 04/16/2019   Procedure: COLONOSCOPY WITH PROPOFOL;  Surgeon: Virgel Manifold, MD;  Location: [facility];  Service: Endoscopy;  Laterality: N/A;   COLONOSCOPY WITH PROPOFOL N/A 01/16/2020   Procedure: COLONOSCOPY WITH PROPOFOL;  Surgeon: Virgel Manifold, MD;  Location: [facility];  Service: Endoscopy;  Laterality: N/A;   ENDARTERECTOMY FEMORAL Left 10/16/2021   Procedure: ENDARTERECTOMY FEMORAL;  Surgeon: Algernon Huxley, MD;  Location: [facility];  Service: Vascular;  Laterality: Left;   ESOPHAGOGASTRODUODENOSCOPY (EGD) WITH PROPOFOL N/A 06/15/2018   Procedure: ESOPHAGOGASTRODUODENOSCOPY (EGD) WITH PROPOFOL;  Surgeon: Toledo, Benay Pike, MD;  Location: [facility];  Service: Gastroenterology;  Laterality:  N/A;   ESOPHAGOGASTRODUODENOSCOPY (EGD) WITH PROPOFOL N/A 01/03/2019   Procedure: ESOPHAGOGASTRODUODENOSCOPY (EGD) WITH PROPOFOL;  Surgeon: Lucilla Lame, MD;  Location: [facility];  Service: Endoscopy;  Laterality: N/A;   ESOPHAGOGASTRODUODENOSCOPY (EGD) WITH PROPOFOL N/A 09/25/2021   Procedure: ESOPHAGOGASTRODUODENOSCOPY (EGD) WITH PROPOFOL;  Surgeon: Lucilla Lame, MD;  Location: [facility];  Service: Endoscopy;   Laterality: N/A;   GIVENS CAPSULE STUDY  04/16/2019   Procedure: GIVENS CAPSULE STUDY;  Surgeon: Virgel Manifold, MD;  Location: [facility];  Service: Endoscopy;;   GIVENS CAPSULE STUDY N/A 06/25/2019   Procedure: GIVENS CAPSULE STUDY;  Surgeon: Jonathon Bellows, MD;  Location: [facility];  Service: Gastroenterology;  Laterality: N/A;   LOWER EXTREMITY ANGIOGRAPHY Right 08/21/2018   Procedure: LOWER EXTREMITY ANGIOGRAPHY;  Surgeon: Algernon Huxley, MD;  Location: [place] [facility];  Service: Cardiovascular;  Laterality: Right;   LOWER EXTREMITY ANGIOGRAPHY Left 08/28/2018   Procedure: LOWER EXTREMITY ANGIOGRAPHY;  Surgeon: Algernon Huxley, MD;  Location: [place] [facility];  Service: Cardiovascular;  Laterality: Left;   LOWER EXTREMITY ANGIOGRAPHY Right 08/28/2018   Procedure: Lower Extremity Angiography;  Surgeon: Algernon Huxley, MD;  Location: [place] [facility];  Service: Cardiovascular;  Laterality: Right;   LOWER EXTREMITY ANGIOGRAPHY Right 12/18/2018   Procedure: Lower Extremity Angiography;  Surgeon: Algernon Huxley, MD;  Location: [place] [facility];  Service: Cardiovascular;  Laterality: Right;   LOWER EXTREMITY ANGIOGRAPHY Right 12/18/2018   Procedure: Lower Extremity Angiography;  Surgeon: Algernon Huxley, MD;  Location: [place] [facility];  Service: Cardiovascular;  Laterality: Right;   LOWER EXTREMITY ANGIOGRAPHY Left 12/21/2018   Procedure: Lower Extremity Angiography;  Surgeon: Algernon Huxley, MD;  Location: [place] [facility];  Service: Cardiovascular;  Laterality: Left;   LOWER EXTREMITY ANGIOGRAPHY Right 12/21/2018   Procedure: Lower Extremity Angiography;  Surgeon: Algernon Huxley, MD;  Location: [place] [facility];  Service: Cardiovascular;  Laterality: Right;   LOWER EXTREMITY ANGIOGRAPHY Left 12/25/2020   Procedure: LOWER EXTREMITY ANGIOGRAPHY;  Surgeon: Algernon Huxley, MD;  Location: [place] [facility];  Service: Cardiovascular;  Laterality: Left;   LOWER EXTREMITY ANGIOGRAPHY Left  09/21/2021   Procedure: Lower Extremity Angiography;  Surgeon: Algernon Huxley, MD;  Location: [place] [facility];  Service: Cardiovascular;  Laterality: Left;   LOWER EXTREMITY ANGIOGRAPHY Left 10/13/2021   Procedure: Lower Extremity Angiography;  Surgeon: Katha Cabal, MD;  Location: [place] [facility];  Service: Cardiovascular;  Laterality: Left;   LOWER EXTREMITY ANGIOGRAPHY Left 10/14/2021   Procedure: Lower Extremity Angiography;  Surgeon: Algernon Huxley, MD;  Location: [place] [facility];  Service: Cardiovascular;  Laterality: Left;   LOWER EXTREMITY ANGIOGRAPHY Left 11/02/2021   Procedure: Lower Extremity Angiography;  Surgeon: Algernon Huxley, MD;  Location: [place] [facility];  Service: Cardiovascular;  Laterality: Left;   LOWER EXTREMITY INTERVENTION N/A 12/22/2018   Procedure: LOWER EXTREMITY INTERVENTION;  Surgeon: Algernon Huxley, MD;  Location: [place] [facility];  Service: Cardiovascular;  Laterality: N/A;   LOWER EXTREMITY INTERVENTION Left 12/26/2020   Procedure: LOWER EXTREMITY INTERVENTION;  Surgeon: Algernon Huxley, MD;  Location: [place] [facility];  Service: Cardiovascular;  Laterality: Left;   LOWER EXTREMITY INTERVENTION N/A 09/22/2021   Procedure: LOWER EXTREMITY INTERVENTION;  Surgeon: Algernon Huxley, MD;  Location: [place] [facility];  Service: Cardiovascular;  Laterality: N/A;    MEDICATIONS:  Prior to Admission medications   Medication Sig Start Date End Date Taking?  Authorizing Provider  acetaminophen (TYLENOL) 325 MG tablet Take 650 mg by mouth every 6 (six) hours as needed.    [provider]  albuterol (PROVENTIL HFA;VENTOLIN HFA) 108 (90 Base) MCG/ACT inhaler Inhale 2 puffs into the lungs every 6 (six) hours as needed for wheezing or shortness of breath. 08/20/17   Schuyler Amor, MD  apixaban (ELIQUIS) 5 MG TABS tablet Take 1 tablet (5 mg total) by mouth 2 (two) times daily. 10/20/21   Serafina Mitchell, MD  aspirin 81 MG chewable tablet Chew 1 tablet (81 mg  total) by mouth daily. 09/29/21   Kris Hartmann, NP  atorvastatin (LIPITOR) 10 MG tablet Take 10 mg by mouth at bedtime. 09/04/21   [provider]  buPROPion (ZYBAN) 150 MG 12 hr tablet Take 150 mg by mouth 2 (two) times daily. 08/24/21   [provider]  cetirizine (ZYRTEC) 10 MG tablet Take 10 mg by mouth daily as needed for allergies.    [provider]  collagenase (SANTYL) 250 UNIT/GM ointment Apply 1 Application topically daily. 02/16/22   Kris Hartmann, NP  docusate sodium (COLACE) 100 MG capsule Take 100 mg by mouth daily.    [provider]  DULoxetine (CYMBALTA) 60 MG capsule Take 1 capsule (60 mg total) by mouth daily. 11/20/21   Kris Hartmann, NP  famotidine (PEPCID) 20 MG tablet Take 1 tablet (20 mg total) by mouth at bedtime. 09/28/21 01/26/22  Kris Hartmann, NP  ferrous sulfate 325 (65 FE) MG tablet Take 325 mg by mouth daily.     [provider]  folic acid (FOLVITE) 1 MG tablet Take 1 mg by mouth daily. 08/13/20   [provider]  gabapentin (NEURONTIN) 300 MG capsule TAKE 3 CAPSULES BY MOUTH IN THE  MORNING AND 3 CAPSULES BY MOUTH  IN THE EVENING 11/30/21   Kris Hartmann, NP  hydroxychloroquine (PLAQUENIL) 200 MG tablet Take 200 mg by mouth daily. 01/22/21   [provider]  JARDIANCE 10 MG TABS tablet Take 10 mg by mouth every morning. 05/08/20   [provider]  lisinopril (ZESTRIL) 10 MG tablet Take 1 tablet (10 mg total) by mouth daily. Patient taking differently: Take 10 mg by mouth every morning. 07/26/19   Lorella Nimrod, MD  LORazepam (ATIVAN) 0.5 MG tablet Take 1 tablet (0.5 mg total) by mouth every 8 (eight) hours as needed for anxiety. 12/17/21   Kris Hartmann, NP  Multiple Vitamins-Minerals (WOMENS MULTIVITAMIN PO) Take 1 tablet by mouth daily.    [provider]  ondansetron (ZOFRAN-ODT) 4 MG disintegrating tablet Take 1 tablet (4 mg total) by mouth every 8 (eight) hours as needed for nausea  or vomiting. 11/03/21   Rada Hay, MD  oxyCODONE (ROXICODONE) 15 MG immediate release tablet Take 1-2 tablets (15-30 mg total) by mouth every 6 (six) hours as needed for pain. 02/15/22   Kris Hartmann, NP  pantoprazole (PROTONIX) 40 MG tablet Take 1 tablet (40 mg total) by mouth daily. Patient taking differently: Take 40 mg by mouth every morning. 09/29/21   Kris Hartmann, NP  sitaGLIPtin (JANUVIA) 100 MG tablet Take 100 mg by mouth every morning.    [provider]  sulfamethoxazole-trimethoprim (BACTRIM DS) 800-160 MG tablet Take 1 tablet by mouth 2 (two) times daily. 02/16/22   Kris Hartmann, NP  VICTOZA 18 MG/3ML SOPN Inject into the skin. 01/13/22   [provider]  vitamin B-12 (CYANOCOBALAMIN) 500 MCG  tablet Take 1 tablet (500 mcg total) by mouth daily. 11/26/19   Earlie Server, MD    Physical Exam   Triage Vital Signs: ED Triage Vitals  Enc Vitals Group     BP 02/22/22 1942 139/79     Pulse Rate 02/22/22 1942 (!) 110     Resp 02/22/22 1942 19     Temp 02/22/22 1942 98.3 F (36.8 C)     Temp Source 02/22/22 1942 Oral     SpO2 02/22/22 1942 99 %     Weight 02/22/22 1942 135 lb (61.2 kg)     Height 02/22/22 1942 5\' 7"  (1.702 m)     Head Circumference --      Peak Flow --      Pain Score 02/22/22 1955 0     Pain Loc --      Pain Edu? --      Excl. in [place]? --     Most recent vital signs: Vitals:   02/23/22 0023 02/23/22 0155  BP: (!) 152/83 (!) 154/80  Pulse: 88 98  Resp: 20 20  Temp:  98.1 F (36.7 C)  SpO2: 99% 100%    CONSTITUTIONAL: Alert and oriented and responds appropriately to questions. Well-appearing; well-nourished HEAD: Normocephalic, atraumatic EYES: Conjunctivae clear, pupils appear equal, sclera nonicteric ENT: normal nose; moist mucous membranes NECK: Supple, normal ROM CARD: RRR; S1 and S2 appreciated; no murmurs, no clicks, no rubs, no gallops RESP: Normal chest excursion without splinting or tachypnea; breath sounds clear  and equal bilaterally; no wheezes, no rhonchi, no rales, no hypoxia or respiratory distress, speaking full sentences ABD/GI: Normal bowel sounds; non-distended; soft, non-tender, no rebound, no guarding, no peritoneal signs BACK: The back appears normal EXT: Remedies are warm and well-perfused.  She is a 4 x 3 cm superficial opening to the medial aspect of the left BKA with small amount of purulent drainage but no surrounding redness, warmth, induration or fluctuance. SKIN: Normal color for age and race; warm; no rash on exposed skin NEURO: Moves all extremities equally, normal speech PSYCH: The patient's mood and manner are appropriate.      LEFT BKA    Patient gave verbal permission to utilize photo for medical documentation only. The image was not stored on any personal device.   ED Results / Procedures / Treatments   LABS: (all labs ordered are listed, but only abnormal results are displayed) Labs Reviewed  CBC - Abnormal; Notable for the following components:      Result Value   Hemoglobin 11.4 (*)    HCT 33.2 (*)    Platelets 547 (*)    All other components within normal limits  BASIC METABOLIC PANEL - Abnormal; Notable for the following components:   CO2 21 (*)    Glucose, Bld 180 (*)    Creatinine, Ser 1.03 (*)    GFR, Estimated 60 (*)    All other components within normal limits  PROCALCITONIN     EKG:     RADIOLOGY: My personal review and interpretation of imaging: 3 shows no bony involvement.  I have personally reviewed all radiology reports.   DG Knee 2 Views Left  Result Date: 02/23/2022 CLINICAL DATA:  Left stump infection. EXAM: LEFT KNEE - 1-2 VIEW COMPARISON:  None Available. FINDINGS: Below the knee amputation of the left lower extremity is noted. Mild to moderate severity soft tissue swelling is seen along the stump site. A superficial 10 mm soft tissue defect and an adjacent surgical staple  are also noted within this region. A vascular stent is  seen along the posterior aspect of the left femur. This extends to the level of the left popliteal fossa. IMPRESSION: Below the knee amputation of the left lower extremity with mild to moderate severity cellulitis along the stump site. Electronically Signed   By: Virgina Norfolk M.D.   On: 02/23/2022 00:39     PROCEDURES:  Critical Care performed: No    .Suture Removal  Date/Time: 02/23/2022 1:17 AM  Performed by: Faraz Ponciano, Delice Bison, DO Authorized by: Edan Juday, Delice Bison, DO   Consent:    Consent obtained:  Verbal   Consent given by:  Patient   Risks, benefits, and alternatives were discussed: yes     Risks discussed:  Bleeding and pain   Alternatives discussed:  Referral Universal protocol:    Procedure explained and questions answered to patient or proxy's satisfaction: yes     Relevant documents present and verified: yes     Test results available: yes     Imaging studies available: yes     Required blood products, implants, devices, and special equipment available: yes     Site/side marked: yes     Immediately prior to procedure, a time out was called: yes     Patient identity confirmed:  Verbally with patient Location:    Location:  Lower extremity   Lower extremity location:  Knee   Knee location:  L knee Procedure details:    Wound appearance:  Draining, purulent and tender   Number of staples removed:  1 Post-procedure details:    Post-removal:  Antibiotic ointment applied and dressing applied   Procedure completion:  Tolerated well, no immediate complications     IMPRESSION / MDM / ASSESSMENT AND PLAN / ED COURSE  I reviewed the triage vital signs and the nursing notes.    Patient here with worsening cellulitis to the left BKA.  She is on Bactrim for the past week and states she feels the wound is worsening.  Has a foul-smelling purulent drainage.  The patient is on the cardiac monitor to evaluate for evidence of arrhythmia and/or significant heart rate  changes.   DIFFERENTIAL DIAGNOSIS (includes but not limited to):   Cellulitis, no sign of abscess, osteomyelitis, no signs of compartment syndrome or septic arthritis   Patient's presentation is most consistent with acute presentation with potential threat to life or bodily function.   PLAN: Obtain CBC, BMP, procalcitonin, x-ray of the left knee.  We will give IV clindamycin here.  Wound culture already obtained on 10/31.   MEDICATIONS GIVEN IN ED: Medications  clindamycin (CLEOCIN) IVPB 900 mg (0 mg Intravenous Stopped 02/23/22 0107)  ondansetron (ZOFRAN) injection 4 mg (4 mg Intravenous Given 02/23/22 0048)  bacitracin ointment ( Topical Given 02/23/22 0157)  ondansetron (ZOFRAN) injection 4 mg (4 mg Intravenous Given 02/23/22 0156)     ED COURSE: Patient's labs show no leukocytosis, normal electrolytes and renal function, normal procalcitonin.  X-ray shows no bony involvement when reviewed/interpreted by myself and the radiologist does show mild to moderate stump cellulitis.  Have offered patient admission given worsening symptoms despite on outpatient oral antibiotics.  Patient is tachycardic and intermittently vomiting here.  She states that she does not have childcare for her child at home who is being watched by a babysitter at this time and no family member can help her therefore she will have to leave [place].  She states she will return tomorrow for further  evaluation, treatment and admission.  Patient appears to have capacity to make decisions for herself.  Not intoxicated.  Understands risk of leaving without further treatment including worsening symptoms, severe and permanent disability, death.  Discussed with patient that I have placed a referral to the wound care center.  Will discharge with refill of Zofran.  Did have 1 staple still noted in her wound from her surgery in July.  I removed this without difficulty.  2:12 AM  Pt continues to feel poorly and  nauseated.  She now agrees to medical admission.  Will discuss with the hospitalist.   CONSULTS:   Consulted and discussed patient's case with hospitalist, Dr. Sidney Ace.  I have recommended admission and consulting physician agrees and will place admission orders.  Patient (and family if present) agree with this plan.   I reviewed all nursing notes, vitals, pertinent previous records.  All labs, EKGs, imaging ordered have been independently reviewed and interpreted by myself.    OUTSIDE RECORDS REVIEWED: Reviewed multiple recent vascular surgery notes.       FINAL CLINICAL IMPRESSION(S) / ED DIAGNOSES   Final diagnoses:  Cellulitis of left lower extremity     Rx / DC Orders   ED Discharge Orders          Ordered    ondansetron (ZOFRAN-ODT) 4 MG disintegrating tablet  Every 6 hours PRN        02/23/22 0124    AMB referral to wound care center        02/22/22 2328             Note:  This document was prepared using Dragon voice recognition software and may include unintentional dictation errors.   Maralee Higuchi, Delice Bison, DO 02/23/22 0140    Evita Merida, Delice Bison, DO 02/23/22 [id number]

## 2022-02-23 DIAGNOSIS — A419 Sepsis, unspecified organism: Secondary | ICD-10-CM

## 2022-02-23 DIAGNOSIS — L03116 Cellulitis of left lower limb: Secondary | ICD-10-CM | POA: Diagnosis present

## 2022-02-23 DIAGNOSIS — Z89512 Acquired absence of left leg below knee: Secondary | ICD-10-CM

## 2022-02-23 DIAGNOSIS — Z79631 Long term (current) use of antimetabolite agent: Secondary | ICD-10-CM | POA: Diagnosis not present

## 2022-02-23 DIAGNOSIS — T8781 Dehiscence of amputation stump: Secondary | ICD-10-CM | POA: Diagnosis present

## 2022-02-23 DIAGNOSIS — Y835 Amputation of limb(s) as the cause of abnormal reaction of the patient, or of later complication, without mention of misadventure at the time of the procedure: Secondary | ICD-10-CM | POA: Diagnosis present

## 2022-02-23 DIAGNOSIS — E1169 Type 2 diabetes mellitus with other specified complication: Secondary | ICD-10-CM

## 2022-02-23 DIAGNOSIS — F419 Anxiety disorder, unspecified: Secondary | ICD-10-CM | POA: Diagnosis present

## 2022-02-23 DIAGNOSIS — Z87891 Personal history of nicotine dependence: Secondary | ICD-10-CM | POA: Diagnosis not present

## 2022-02-23 DIAGNOSIS — E1151 Type 2 diabetes mellitus with diabetic peripheral angiopathy without gangrene: Secondary | ICD-10-CM | POA: Diagnosis present

## 2022-02-23 DIAGNOSIS — B9561 Methicillin susceptible Staphylococcus aureus infection as the cause of diseases classified elsewhere: Secondary | ICD-10-CM | POA: Diagnosis present

## 2022-02-23 DIAGNOSIS — E1142 Type 2 diabetes mellitus with diabetic polyneuropathy: Secondary | ICD-10-CM

## 2022-02-23 DIAGNOSIS — L0390 Cellulitis, unspecified: Secondary | ICD-10-CM | POA: Diagnosis not present

## 2022-02-23 DIAGNOSIS — Z803 Family history of malignant neoplasm of breast: Secondary | ICD-10-CM | POA: Diagnosis not present

## 2022-02-23 DIAGNOSIS — E1165 Type 2 diabetes mellitus with hyperglycemia: Secondary | ICD-10-CM | POA: Diagnosis present

## 2022-02-23 DIAGNOSIS — A4189 Other specified sepsis: Secondary | ICD-10-CM | POA: Diagnosis present

## 2022-02-23 DIAGNOSIS — K219 Gastro-esophageal reflux disease without esophagitis: Secondary | ICD-10-CM | POA: Diagnosis present

## 2022-02-23 DIAGNOSIS — E785 Hyperlipidemia, unspecified: Secondary | ICD-10-CM | POA: Diagnosis present

## 2022-02-23 DIAGNOSIS — I10 Essential (primary) hypertension: Secondary | ICD-10-CM | POA: Diagnosis present

## 2022-02-23 DIAGNOSIS — M069 Rheumatoid arthritis, unspecified: Secondary | ICD-10-CM | POA: Insufficient documentation

## 2022-02-23 DIAGNOSIS — Z90710 Acquired absence of both cervix and uterus: Secondary | ICD-10-CM | POA: Diagnosis not present

## 2022-02-23 DIAGNOSIS — Z79899 Other long term (current) drug therapy: Secondary | ICD-10-CM | POA: Diagnosis not present

## 2022-02-23 DIAGNOSIS — J449 Chronic obstructive pulmonary disease, unspecified: Secondary | ICD-10-CM | POA: Diagnosis present

## 2022-02-23 DIAGNOSIS — F32A Depression, unspecified: Secondary | ICD-10-CM | POA: Diagnosis present

## 2022-02-23 DIAGNOSIS — T8744 Infection of amputation stump, left lower extremity: Secondary | ICD-10-CM | POA: Diagnosis present

## 2022-02-23 DIAGNOSIS — D638 Anemia in other chronic diseases classified elsewhere: Secondary | ICD-10-CM | POA: Diagnosis present

## 2022-02-23 DIAGNOSIS — Z8616 Personal history of COVID-19: Secondary | ICD-10-CM | POA: Diagnosis not present

## 2022-02-23 DIAGNOSIS — Z89511 Acquired absence of right leg below knee: Secondary | ICD-10-CM | POA: Diagnosis not present

## 2022-02-23 LAB — BASIC METABOLIC PANEL
Anion gap: 8 (ref 5–15)
BUN: 16 mg/dL (ref 8–23)
CO2: 21 mmol/L — ABNORMAL LOW (ref 22–32)
Calcium: 9.7 mg/dL (ref 8.9–10.3)
Chloride: 112 mmol/L — ABNORMAL HIGH (ref 98–111)
Creatinine, Ser: 1.23 mg/dL — ABNORMAL HIGH (ref 0.44–1.00)
GFR, Estimated: 48 mL/min — ABNORMAL LOW (ref >60)
Glucose, Bld: 277 mg/dL — ABNORMAL HIGH (ref 70–99)
Potassium: 3.9 mmol/L (ref 3.5–5.1)
Sodium: 141 mmol/L (ref 135–145)

## 2022-02-23 LAB — CBC
HCT: 30.5 % — ABNORMAL LOW (ref 36.0–46.0)
Hemoglobin: 10.4 g/dL — ABNORMAL LOW (ref 12.0–15.0)
MCH: 27.4 pg (ref 26.0–34.0)
MCHC: 34.1 g/dL (ref 30.0–36.0)
MCV: 80.5 fL (ref 80.0–100.0)
Platelets: 521 10*3/uL — ABNORMAL HIGH (ref 150–400)
RBC: 3.79 MIL/uL — ABNORMAL LOW (ref 3.87–5.11)
RDW: 14.7 % (ref 11.5–15.5)
WBC: 10.3 10*3/uL (ref 4.0–10.5)
nRBC: 0 % (ref 0.0–0.2)

## 2022-02-23 LAB — GLUCOSE, CAPILLARY
Glucose-Capillary: 204 mg/dL — ABNORMAL HIGH (ref 70–99)
Glucose-Capillary: 218 mg/dL — ABNORMAL HIGH (ref 70–99)
Glucose-Capillary: 179 mg/dL — ABNORMAL HIGH (ref 70–99)

## 2022-02-23 LAB — PROCALCITONIN
Procalcitonin: <0.1 ng/mL
Procalcitonin: <0.1 ng/mL

## 2022-02-23 LAB — HIV ANTIBODY (ROUTINE TESTING W REFLEX): HIV Screen 4th Generation wRfx: NONREACTIVE

## 2022-02-23 LAB — CORTISOL-AM, BLOOD: Cortisol - AM: 33.6 ug/dL — ABNORMAL HIGH (ref 6.7–22.6)

## 2022-02-23 LAB — PROTIME-INR
INR: 1.1 (ref 0.8–1.2)
Prothrombin Time: 14.4 seconds (ref 11.4–15.2)

## 2022-02-23 LAB — CBG MONITORING, ED: Glucose-Capillary: 266 mg/dL — ABNORMAL HIGH (ref 70–99)

## 2022-02-23 MED ORDER — APIXABAN 5 MG PO TABS
5.0000 mg | ORAL_TABLET | Freq: Two times a day (BID) | ORAL | Status: DC
  Administered 2022-02-23 – 2022-02-25 (×3): 5 mg via ORAL
  Filled 2022-02-23 (×3): qty 1

## 2022-02-23 MED ORDER — TRAZODONE HCL 50 MG PO TABS
25.0000 mg | ORAL_TABLET | Freq: Every evening | ORAL | Status: DC | PRN
  Administered 2022-02-23: 25 mg via ORAL
  Filled 2022-02-23: qty 1

## 2022-02-23 MED ORDER — ASPIRIN 81 MG PO CHEW
81.0000 mg | CHEWABLE_TABLET | Freq: Every day | ORAL | Status: DC
  Filled 2022-02-23: qty 1

## 2022-02-23 MED ORDER — MORPHINE SULFATE (PF) 2 MG/ML IV SOLN: 2.0000 mg | INTRAVENOUS | Status: DC | PRN

## 2022-02-23 MED ORDER — FOLIC ACID 1 MG PO TABS
1.0000 mg | ORAL_TABLET | Freq: Every day | ORAL | Status: DC
  Administered 2022-02-25: 1 mg via ORAL
  Filled 2022-02-23: qty 1

## 2022-02-23 MED ORDER — PROCHLORPERAZINE EDISYLATE 10 MG/2ML IJ SOLN
10.0000 mg | Freq: Four times a day (QID) | INTRAMUSCULAR | Status: DC | PRN
  Administered 2022-02-23: 10 mg via INTRAVENOUS
  Filled 2022-02-23: qty 2

## 2022-02-23 MED ORDER — ATORVASTATIN CALCIUM 10 MG PO TABS
10.0000 mg | ORAL_TABLET | Freq: Every day | ORAL | Status: DC
  Administered 2022-02-24 – 2022-02-25 (×2): 10 mg via ORAL
  Filled 2022-02-23 (×2): qty 1

## 2022-02-23 MED ORDER — ALBUTEROL SULFATE (2.5 MG/3ML) 0.083% IN NEBU: 2.5000 mg | INHALATION_SOLUTION | Freq: Four times a day (QID) | RESPIRATORY_TRACT | Status: DC | PRN

## 2022-02-23 MED ORDER — FAMOTIDINE 20 MG PO TABS
20.0000 mg | ORAL_TABLET | Freq: Every day | ORAL | Status: DC
  Administered 2022-02-23 – 2022-02-24 (×2): 20 mg via ORAL
  Filled 2022-02-23 (×2): qty 1

## 2022-02-23 MED ORDER — ONDANSETRON HCL 4 MG/2ML IJ SOLN
4.0000 mg | Freq: Once | INTRAMUSCULAR | Status: AC
  Administered 2022-02-23: 4 mg via INTRAVENOUS
  Filled 2022-02-23: qty 2

## 2022-02-23 MED ORDER — OXYCODONE HCL 5 MG PO TABS
15.0000 mg | ORAL_TABLET | Freq: Four times a day (QID) | ORAL | Status: DC | PRN
  Administered 2022-02-23 – 2022-02-25 (×4): 15 mg via ORAL
  Filled 2022-02-23 (×4): qty 3

## 2022-02-23 MED ORDER — LINAGLIPTIN 5 MG PO TABS
5.0000 mg | ORAL_TABLET | Freq: Every day | ORAL | Status: DC
  Administered 2022-02-25: 5 mg via ORAL
  Filled 2022-02-23 (×3): qty 1

## 2022-02-23 MED ORDER — PANTOPRAZOLE SODIUM 40 MG PO TBEC
40.0000 mg | DELAYED_RELEASE_TABLET | ORAL | Status: DC
  Administered 2022-02-23 – 2022-02-25 (×3): 40 mg via ORAL
  Filled 2022-02-23 (×3): qty 1

## 2022-02-23 MED ORDER — MEDIHONEY WOUND/BURN DRESSING EX PSTE
1.0000 | PASTE | Freq: Every day | CUTANEOUS | Status: DC
  Filled 2022-02-23: qty 44

## 2022-02-23 MED ORDER — BACITRACIN ZINC 500 UNIT/GM EX OINT
TOPICAL_OINTMENT | Freq: Once | CUTANEOUS | Status: AC
  Filled 2022-02-23: qty 0.9

## 2022-02-23 MED ORDER — SODIUM CHLORIDE 0.9 % IV SOLN
2.0000 g | Freq: Three times a day (TID) | INTRAVENOUS | Status: DC
  Administered 2022-02-23: 2 g via INTRAVENOUS
  Filled 2022-02-23 (×2): qty 10

## 2022-02-23 MED ORDER — LISINOPRIL 10 MG PO TABS
10.0000 mg | ORAL_TABLET | ORAL | Status: DC
  Administered 2022-02-23 – 2022-02-25 (×3): 10 mg via ORAL
  Filled 2022-02-23 (×3): qty 1

## 2022-02-23 MED ORDER — ADULT MULTIVITAMIN W/MINERALS CH
1.0000 | ORAL_TABLET | Freq: Every day | ORAL | Status: DC
  Administered 2022-02-25: 1 via ORAL
  Filled 2022-02-23: qty 1

## 2022-02-23 MED ORDER — VITAMIN B-12 1000 MCG PO TABS
500.0000 ug | ORAL_TABLET | Freq: Every day | ORAL | Status: DC
  Administered 2022-02-25: 500 ug via ORAL
  Filled 2022-02-23: qty 1

## 2022-02-23 MED ORDER — LORAZEPAM 0.5 MG PO TABS: 0.5000 mg | ORAL_TABLET | Freq: Three times a day (TID) | ORAL | Status: DC | PRN

## 2022-02-23 MED ORDER — ONDANSETRON 4 MG PO TBDP: 4.0000 mg | ORAL_TABLET | Freq: Four times a day (QID) | ORAL | 0 refills | Status: DC | PRN

## 2022-02-23 MED ORDER — BUPROPION HCL ER (SR) 150 MG PO TB12
150.0000 mg | ORAL_TABLET | Freq: Two times a day (BID) | ORAL | Status: DC
  Filled 2022-02-23: qty 1

## 2022-02-23 MED ORDER — DULOXETINE HCL 30 MG PO CPEP
60.0000 mg | ORAL_CAPSULE | Freq: Every day | ORAL | Status: DC
  Administered 2022-02-24 – 2022-02-25 (×2): 60 mg via ORAL
  Filled 2022-02-23 (×2): qty 2

## 2022-02-23 MED ORDER — EMPAGLIFLOZIN 10 MG PO TABS
10.0000 mg | ORAL_TABLET | ORAL | Status: DC
  Administered 2022-02-23 – 2022-02-25 (×2): 10 mg via ORAL
  Filled 2022-02-23 (×3): qty 1

## 2022-02-23 MED ORDER — SODIUM CHLORIDE 0.9 % IV SOLN: 2.0000 g | Freq: Once | INTRAVENOUS | Status: DC

## 2022-02-23 MED ORDER — FERROUS SULFATE 325 (65 FE) MG PO TABS
325.0000 mg | ORAL_TABLET | Freq: Every day | ORAL | Status: DC
  Administered 2022-02-24 – 2022-02-25 (×2): 325 mg via ORAL
  Filled 2022-02-23 (×2): qty 1

## 2022-02-23 MED ORDER — GABAPENTIN 300 MG PO CAPS
900.0000 mg | ORAL_CAPSULE | Freq: Two times a day (BID) | ORAL | Status: DC
  Administered 2022-02-23 – 2022-02-25 (×3): 900 mg via ORAL
  Filled 2022-02-23 (×3): qty 3

## 2022-02-23 MED ORDER — MAGNESIUM HYDROXIDE 400 MG/5ML PO SUSP: 30.0000 mL | Freq: Every day | ORAL | Status: DC | PRN

## 2022-02-23 MED ORDER — HYDROXYCHLOROQUINE SULFATE 200 MG PO TABS
200.0000 mg | ORAL_TABLET | Freq: Every day | ORAL | Status: DC
  Administered 2022-02-24 – 2022-02-25 (×2): 200 mg via ORAL
  Filled 2022-02-23 (×3): qty 1

## 2022-02-23 MED ORDER — ACETAMINOPHEN 650 MG RE SUPP: 650.0000 mg | Freq: Four times a day (QID) | RECTAL | Status: DC | PRN

## 2022-02-23 MED ORDER — DOCUSATE SODIUM 100 MG PO CAPS
100.0000 mg | ORAL_CAPSULE | Freq: Every day | ORAL | Status: DC
  Filled 2022-02-23: qty 1

## 2022-02-23 MED ORDER — INSULIN ASPART 100 UNIT/ML IJ SOLN
0.0000 [IU] | Freq: Three times a day (TID) | INTRAMUSCULAR | Status: DC
  Administered 2022-02-23 (×2): 3 [IU] via SUBCUTANEOUS
  Administered 2022-02-24 – 2022-02-25 (×3): 2 [IU] via SUBCUTANEOUS
  Filled 2022-02-23 (×5): qty 1

## 2022-02-23 MED ORDER — ONDANSETRON HCL 4 MG/2ML IJ SOLN
4.0000 mg | Freq: Four times a day (QID) | INTRAMUSCULAR | Status: DC | PRN
  Administered 2022-02-23: 4 mg via INTRAVENOUS
  Filled 2022-02-23: qty 2

## 2022-02-23 MED ORDER — ONDANSETRON HCL 4 MG PO TABS: 4.0000 mg | ORAL_TABLET | Freq: Four times a day (QID) | ORAL | Status: DC | PRN

## 2022-02-23 MED ORDER — CEFAZOLIN SODIUM-DEXTROSE 2-4 GM/100ML-% IV SOLN
2.0000 g | Freq: Three times a day (TID) | INTRAVENOUS | Status: DC
  Administered 2022-02-23 – 2022-02-25 (×5): 2 g via INTRAVENOUS
  Filled 2022-02-23 (×9): qty 100

## 2022-02-23 MED ORDER — ACETAMINOPHEN 325 MG PO TABS: 650.0000 mg | ORAL_TABLET | Freq: Four times a day (QID) | ORAL | Status: DC | PRN

## 2022-02-23 NOTE — Assessment & Plan Note (Signed)
-   We will continue statin therapy. 

## 2022-02-23 NOTE — Discharge Instructions (Signed)
I recommend that you return to the emergency department as soon as possible for admission to the hospital given you are not improving on Bactrim as an outpatient and your x-ray showed mild to moderate cellulitis of the left below-knee amputation stump.  You have decided to leave Sylvia [name].  This could lead to worsening infection, severe, disability and even death.  You are not able to return to the emergency department for close evaluation, further treatment and admission, recommended close follow-up with your PCP and vascular surgery.  I have also placed a referral for wound care.

## 2022-02-23 NOTE — H&P (Addendum)
[place]   PATIENT NAME: Sylvia [name]    MR#:  [id number]  DATE OF BIRTH:  06/22/1955  DATE OF ADMISSION:  02/22/2022  PRIMARY CARE PHYSICIAN: [facility]   Patient is coming from: Home  REQUESTING/REFERRING PHYSICIAN: Ward, Layla Maw, DO  CHIEF COMPLAINT:   Chief Complaint  Patient presents with   Wound Check    HISTORY OF PRESENT ILLNESS:  Sylvia [name] is a 66 y.o. female with medical history significant for anxiety, osteoarthritis, cocaine abuse, COPD, depression, type diabetes mellitus, GERD and hypertension status post left BKA, presented to the ER with acute onset of left BKA worsening wound infection with mild wound dehiscence with cellulitis failing outpatient treatment with p.o. Bactrim DS.  She denies any fever or chills.  No nausea or vomiting or abdominal pain.  No cough or wheezing or hemoptysis.  Note chest pain or palpitations.  No dysuria, oliguria or hematuria function.  No headache or dizziness, vision.  ED Course: When she came to the ER BP was 189/87 with heart rate 101 and otherwise normal vital signs.  Later on respiratory rate was 21.  Labs revealed a CO2 21 with a creatinine 1.03 with otherwise unremarkable BMP.  Procalcitonin was less than 0.1 and CBC showed anemia with hemoglobin 11.4 hematocrit 33.2 and platelets were significantly elevated at 547.  Wound culture on 02/06/2022 revealed Staph aureus species that was pansensitive.   Imaging: Left knee x-ray showed below-knee amputation of the left lower extremity with mild to moderate severe cellulitis along the stump site.  The patient was given 900 mg of IV clindamycin and 4 mg of IV Zofran twice.  She will be admitted to a medical bed for further evaluation and management. PAST MEDICAL HISTORY:   Past Medical History:  Diagnosis Date   Anemia of chronic disease 05/16/2019   Anxiety    h/o   Arthritis    Cocaine abuse (HCC)    COPD (chronic obstructive  pulmonary disease) (HCC)    Depression    Diabetes mellitus without complication (HCC)    DKA (diabetic ketoacidosis) (HCC)    Elevated troponin    GERD (gastroesophageal reflux disease)    GI bleed    Hypertension    bp under control-off meds since 2019   Ischemic leg     PAST SURGICAL HISTORY:   Past Surgical History:  Procedure Laterality Date   ABDOMINAL HYSTERECTOMY     AMPUTATION Right 12/27/2018   Procedure: AMPUTATION BELOW KNEE;  Surgeon: Annice Needy, MD;  Location: [facility];  Service: General;  Laterality: Right;   AMPUTATION Left 11/05/2021   Procedure: AMPUTATION BELOW KNEE;  Surgeon: Annice Needy, MD;  Location: [facility];  Service: Vascular;  Laterality: Left;   APPLICATION OF WOUND VAC  10/16/2021   Procedure: APPLICATION OF WOUND VAC;  Surgeon: Annice Needy, MD;  Location: [facility];  Service: Vascular;;   CATARACT EXTRACTION W/PHACO Right 03/27/2020   Procedure: CATARACT EXTRACTION PHACO AND INTRAOCULAR LENS PLACEMENT (IOC) RIGHT DIABETIC 7.54 00:52.5;  Surgeon: Galen Manila, MD;  Location: [facility];  Service: Ophthalmology;  Laterality: Right;   CATARACT EXTRACTION W/PHACO Left 05/20/2020   Procedure: CATARACT EXTRACTION PHACO AND INTRAOCULAR LENS PLACEMENT (IOC) LEFT DIABETIC 4.95 00:37.6;  Surgeon: Galen Manila, MD;  Location: [facility];  Service: Ophthalmology;  Laterality: Left;  Diabetic - oral meds COVID + 04-24-20   CHOLECYSTECTOMY     COLONOSCOPY WITH PROPOFOL N/A 04/16/2019  Procedure: COLONOSCOPY WITH PROPOFOL;  Surgeon: Pasty Spillers, MD;  Location: [facility];  Service: Endoscopy;  Laterality: N/A;   COLONOSCOPY WITH PROPOFOL N/A 01/16/2020   Procedure: COLONOSCOPY WITH PROPOFOL;  Surgeon: Pasty Spillers, MD;  Location: [facility];  Service: Endoscopy;  Laterality: N/A;   ENDARTERECTOMY FEMORAL Left 10/16/2021   Procedure: ENDARTERECTOMY FEMORAL;  Surgeon: Annice Needy, MD;  Location: [facility];  Service:  Vascular;  Laterality: Left;   ESOPHAGOGASTRODUODENOSCOPY (EGD) WITH PROPOFOL N/A 06/15/2018   Procedure: ESOPHAGOGASTRODUODENOSCOPY (EGD) WITH PROPOFOL;  Surgeon: Toledo, Boykin Nearing, MD;  Location: [facility];  Service: Gastroenterology;  Laterality: N/A;   ESOPHAGOGASTRODUODENOSCOPY (EGD) WITH PROPOFOL N/A 01/03/2019   Procedure: ESOPHAGOGASTRODUODENOSCOPY (EGD) WITH PROPOFOL;  Surgeon: Midge Minium, MD;  Location: [facility];  Service: Endoscopy;  Laterality: N/A;   ESOPHAGOGASTRODUODENOSCOPY (EGD) WITH PROPOFOL N/A 09/25/2021   Procedure: ESOPHAGOGASTRODUODENOSCOPY (EGD) WITH PROPOFOL;  Surgeon: Midge Minium, MD;  Location: [facility];  Service: Endoscopy;  Laterality: N/A;   GIVENS CAPSULE STUDY  04/16/2019   Procedure: GIVENS CAPSULE STUDY;  Surgeon: Pasty Spillers, MD;  Location: [facility];  Service: Endoscopy;;   GIVENS CAPSULE STUDY N/A 06/25/2019   Procedure: GIVENS CAPSULE STUDY;  Surgeon: Wyline Mood, MD;  Location: [facility];  Service: Gastroenterology;  Laterality: N/A;   LOWER EXTREMITY ANGIOGRAPHY Right 08/21/2018   Procedure: LOWER EXTREMITY ANGIOGRAPHY;  Surgeon: Annice Needy, MD;  Location: [facility];  Service: Cardiovascular;  Laterality: Right;   LOWER EXTREMITY ANGIOGRAPHY Left 08/28/2018   Procedure: LOWER EXTREMITY ANGIOGRAPHY;  Surgeon: Annice Needy, MD;  Location: [facility];  Service: Cardiovascular;  Laterality: Left;   LOWER EXTREMITY ANGIOGRAPHY Right 08/28/2018   Procedure: Lower Extremity Angiography;  Surgeon: Annice Needy, MD;  Location: [facility];  Service: Cardiovascular;  Laterality: Right;   LOWER EXTREMITY ANGIOGRAPHY Right 12/18/2018   Procedure: Lower Extremity Angiography;  Surgeon: Annice Needy, MD;  Location: [facility];  Service: Cardiovascular;  Laterality: Right;   LOWER EXTREMITY ANGIOGRAPHY Right 12/18/2018   Procedure: Lower Extremity Angiography;  Surgeon: Annice Needy, MD;  Location: [facility];  Service: Cardiovascular;  Laterality: Right;   LOWER EXTREMITY ANGIOGRAPHY Left 12/21/2018   Procedure: Lower Extremity Angiography;  Surgeon: Annice Needy, MD;  Location: [facility];  Service: Cardiovascular;  Laterality: Left;   LOWER EXTREMITY ANGIOGRAPHY Right 12/21/2018   Procedure: Lower Extremity Angiography;  Surgeon: Annice Needy, MD;  Location: [facility];  Service: Cardiovascular;  Laterality: Right;   LOWER EXTREMITY ANGIOGRAPHY Left 12/25/2020   Procedure: LOWER EXTREMITY ANGIOGRAPHY;  Surgeon: Annice Needy, MD;  Location: [facility];  Service: Cardiovascular;  Laterality: Left;   LOWER EXTREMITY ANGIOGRAPHY Left 09/21/2021   Procedure: Lower Extremity Angiography;  Surgeon: Annice Needy, MD;  Location: [facility];  Service: Cardiovascular;  Laterality: Left;   LOWER EXTREMITY ANGIOGRAPHY Left 10/13/2021   Procedure: Lower Extremity Angiography;  Surgeon: Renford Dills, MD;  Location: [facility];  Service: Cardiovascular;  Laterality: Left;   LOWER EXTREMITY ANGIOGRAPHY Left 10/14/2021   Procedure: Lower Extremity Angiography;  Surgeon: Annice Needy, MD;  Location: [facility];  Service: Cardiovascular;  Laterality: Left;   LOWER EXTREMITY ANGIOGRAPHY Left 11/02/2021   Procedure: Lower Extremity Angiography;  Surgeon: Annice Needy, MD;  Location: [facility];  Service: Cardiovascular;  Laterality: Left;   LOWER EXTREMITY INTERVENTION  N/A 12/22/2018   Procedure: LOWER EXTREMITY INTERVENTION;  Surgeon: Algernon Huxley, MD;  Location: [place] [facility];  Service: Cardiovascular;  Laterality: N/A;   LOWER EXTREMITY INTERVENTION Left 12/26/2020   Procedure: LOWER EXTREMITY INTERVENTION;  Surgeon: Algernon Huxley, MD;  Location: [place] [facility];  Service: Cardiovascular;  Laterality: Left;   LOWER EXTREMITY INTERVENTION N/A 09/22/2021   Procedure: LOWER EXTREMITY INTERVENTION;  Surgeon: Algernon Huxley, MD;  Location:  [place] [facility];  Service: Cardiovascular;  Laterality: N/A;    SOCIAL HISTORY:   Social History   Tobacco Use   Smoking status: Former    Packs/day: 0.25    Years: 45.00    Total pack years: 11.25    Types: Cigarettes   Smokeless tobacco: Never   Tobacco comments:    quit  Substance Use Topics   Alcohol use: No    FAMILY HISTORY:   Family History  Problem Relation Age of Onset   Breast cancer Mother 66    DRUG ALLERGIES:   Allergies  Allergen Reactions   Vancomycin Rash    Patient developed a rash to injection site and arm a few mintes after starting ABX.    Hydrocodone Rash   Metformin And Related Diarrhea   Penicillins Hives    Has patient had a PCN reaction causing immediate rash, facial/tongue/throat swelling, SOB or lightheadedness with hypotension: Yes Has patient had a PCN reaction causing severe rash involving mucus membranes or skin necrosis: No Has patient had a PCN reaction that required hospitalization: No Has patient had a PCN reaction occurring within the last 10 years: No If all of the above answers are "NO", then may proceed with Cephalosporin use.   Tramadol Itching    REVIEW OF SYSTEMS:   ROS As per history of present illness. All pertinent systems were reviewed above. Constitutional, HEENT, cardiovascular, respiratory, GI, GU, musculoskeletal, neuro, psychiatric, endocrine, integumentary and hematologic systems were reviewed and are otherwise negative/unremarkable except for positive findings mentioned above in the HPI.   MEDICATIONS AT HOME:   Prior to Admission medications   Medication Sig Start Date End Date Taking? Authorizing Provider  ondansetron (ZOFRAN-ODT) 4 MG disintegrating tablet Take 1 tablet (4 mg total) by mouth every 6 (six) hours as needed for nausea or vomiting. 02/23/22  Yes Ward, Delice Bison, DO  acetaminophen (TYLENOL) 325 MG tablet Take 650 mg by mouth every 6 (six) hours as needed.    [provider]   albuterol (PROVENTIL HFA;VENTOLIN HFA) 108 (90 Base) MCG/ACT inhaler Inhale 2 puffs into the lungs every 6 (six) hours as needed for wheezing or shortness of breath. 08/20/17   Schuyler Amor, MD  apixaban (ELIQUIS) 5 MG TABS tablet Take 1 tablet (5 mg total) by mouth 2 (two) times daily. 10/20/21   Serafina Mitchell, MD  aspirin 81 MG chewable tablet Chew 1 tablet (81 mg total) by mouth daily. 09/29/21   Kris Hartmann, NP  atorvastatin (LIPITOR) 10 MG tablet Take 10 mg by mouth at bedtime. 09/04/21   [provider]  buPROPion (ZYBAN) 150 MG 12 hr tablet Take 150 mg by mouth 2 (two) times daily. 08/24/21   [provider]  cetirizine (ZYRTEC) 10 MG tablet Take 10 mg by mouth daily as needed for allergies.    [provider]  collagenase (SANTYL) 250 UNIT/GM ointment Apply 1 Application topically daily. 02/16/22   Kris Hartmann, NP  docusate sodium (COLACE) 100 MG capsule Take 100 mg  by mouth daily.    [provider]  DULoxetine (CYMBALTA) 60 MG capsule Take 1 capsule (60 mg total) by mouth daily. 11/20/21   Georgiana Spinner, NP  famotidine (PEPCID) 20 MG tablet Take 1 tablet (20 mg total) by mouth at bedtime. 09/28/21 01/26/22  Georgiana Spinner, NP  ferrous sulfate 325 (65 FE) MG tablet Take 325 mg by mouth daily.     [provider]  folic acid (FOLVITE) 1 MG tablet Take 1 mg by mouth daily. 08/13/20   [provider]  gabapentin (NEURONTIN) 300 MG capsule TAKE 3 CAPSULES BY MOUTH IN THE  MORNING AND 3 CAPSULES BY MOUTH  IN THE EVENING 11/30/21   Georgiana Spinner, NP  hydroxychloroquine (PLAQUENIL) 200 MG tablet Take 200 mg by mouth daily. 01/22/21   [provider]  JARDIANCE 10 MG TABS tablet Take 10 mg by mouth every morning. 05/08/20   [provider]  lisinopril (ZESTRIL) 10 MG tablet Take 1 tablet (10 mg total) by mouth daily. Patient taking differently: Take 10 mg by mouth every morning. 07/26/19   Arnetha Courser, MD  LORazepam  (ATIVAN) 0.5 MG tablet Take 1 tablet (0.5 mg total) by mouth every 8 (eight) hours as needed for anxiety. 12/17/21   Georgiana Spinner, NP  Multiple Vitamins-Minerals (WOMENS MULTIVITAMIN PO) Take 1 tablet by mouth daily.    [provider]  ondansetron (ZOFRAN-ODT) 4 MG disintegrating tablet Take 1 tablet (4 mg total) by mouth every 8 (eight) hours as needed for nausea or vomiting. 11/03/21   Georga Hacking, MD  oxyCODONE (ROXICODONE) 15 MG immediate release tablet Take 1-2 tablets (15-30 mg total) by mouth every 6 (six) hours as needed for pain. 02/15/22   Georgiana Spinner, NP  pantoprazole (PROTONIX) 40 MG tablet Take 1 tablet (40 mg total) by mouth daily. Patient taking differently: Take 40 mg by mouth every morning. 09/29/21   Georgiana Spinner, NP  sitaGLIPtin (JANUVIA) 100 MG tablet Take 100 mg by mouth every morning.    [provider]  sulfamethoxazole-trimethoprim (BACTRIM DS) 800-160 MG tablet Take 1 tablet by mouth 2 (two) times daily. 02/16/22   Georgiana Spinner, NP  VICTOZA 18 MG/3ML SOPN Inject into the skin. 01/13/22   [provider]  vitamin B-12 (CYANOCOBALAMIN) 500 MCG tablet Take 1 tablet (500 mcg total) by mouth daily. 11/26/19   Rickard Patience, MD      VITAL SIGNS:  Blood pressure 137/79, pulse 94, temperature 97.9 F (36.6 C), temperature source Oral, resp. rate 18, height 5\' 7"  (1.702 m), weight 61.2 kg, SpO2 93 %.  PHYSICAL EXAMINATION:  Physical Exam  GENERAL:  66 y.o.-year-old African-American female patient lying in the bed with no acute distress.  EYES: Pupils equal, round, reactive to light and accommodation. No scleral icterus. Extraocular muscles intact.  HEENT: Head atraumatic, normocephalic. Oropharynx and nasopharynx clear.  NECK:  Supple, no jugular venous distention. No thyroid enlargement, no tenderness.  LUNGS: Normal breath sounds bilaterally, no wheezing, rales,rhonchi or crepitation. No use of accessory muscles of respiration.   CARDIOVASCULAR: Regular rate and rhythm, S1, S2 normal. No murmurs, rubs, or gallops.  ABDOMEN: Soft, nondistended, nontender. Bowel sounds present. No organomegaly or mass.  EXTREMITIES: No pedal edema, cyanosis, or clubbing.  NEUROLOGIC: Cranial nerves II through XII are intact. Muscle strength 5/5 in all extremities. Sensation intact. Gait not checked.  PSYCHIATRIC: The patient is alert and oriented x 3.  Normal affect and good eye contact.  SKIN:  left BKA stump with wound dehiscence with swelling and small amount of purulent drainage but no surrounding redness, however with mild warmth and tenderness without induration or fluctuance.   LABORATORY PANEL:   CBC Recent Labs  Lab 02/22/22 1957  WBC 8.7  HGB 11.4*  HCT 33.2*  PLT 547*   ------------------------------------------------------------------------------------------------------------------  Chemistries  Recent Labs  Lab 02/22/22 1957  NA 140  K 4.1  CL 110  CO2 21*  GLUCOSE 180*  BUN 15  CREATININE 1.03*  CALCIUM 9.6   ------------------------------------------------------------------------------------------------------------------  Cardiac Enzymes No results for input(s): "TROPONINI" in the last 168 hours. ------------------------------------------------------------------------------------------------------------------  RADIOLOGY:  DG Knee 2 Views Left  Result Date: 02/23/2022 CLINICAL DATA:  Left stump infection. EXAM: LEFT KNEE - 1-2 VIEW COMPARISON:  None Available. FINDINGS: Below the knee amputation of the left lower extremity is noted. Mild to moderate severity soft tissue swelling is seen along the stump site. A superficial 10 mm soft tissue defect and an adjacent surgical staple are also noted within this region. A vascular stent is seen along the posterior aspect of the left femur. This extends to the level of the left popliteal fossa. IMPRESSION: Below the knee amputation of the left lower extremity  with mild to moderate severity cellulitis along the stump site. Electronically Signed   By: Aram Candela M.D.   On: 02/23/2022 00:39      IMPRESSION AND PLAN:  Assessment and Plan: * Cellulitis of left lower extremity - The patient will be admitted to a medical bed. - Pain management will be provided. - Wound care consult to be obtained. - We will continue her on IV aztreonam. - Vascular surgery consult will be obtained. - I notified Dr. Gilda Crease about patient.  Sepsis due to cellulitis (HCC) - Sepsis manifested by l tachycardia and tachypnea. - We will continue antibiotic therapy with IV aztreonam. - The patient will be hydrated with IV normal saline. - We will follow blood cultures as well as wound culture.  Type 2 diabetes mellitus with peripheral neuropathy (HCC) - The patient will be placed on supplement coverage with NovoLog. - We will continue Jardiance. - We will continue Neurontin.  Essential hypertension - We will continue her antihypertensives.  Rheumatoid arthritis (HCC) She will be continued on methotrexate.  Dyslipidemia - We will continue statin therapy.  Depression - We will continue her antidepressants.   We will DVT prophylaxis: Lovenox.  Advanced Care Planning:  Code Status: full code.  Family Communication:  The plan of care was discussed in details with the patient (and family). I answered all questions. The patient agreed to proceed with the above mentioned plan. Further management will depend upon hospital course. Disposition Plan: Back to previous home environment Consults called: Vascular surgery All the records are reviewed and case discussed with ED provider.  Status is: Inpatient   At the time of the admission, it appears that the appropriate admission status for this patient is inpatient.  This is judged to be reasonable and necessary in order to provide the required intensity of service to ensure the patient's safety given the  presenting symptoms, physical exam findings and initial radiographic and laboratory data in the context of comorbid conditions.  The patient requires inpatient status due to high intensity of service, high risk of further deterioration and high frequency of surveillance required.  I certify that at the time of admission, it is my clinical judgment that the patient will require inpatient hospital care extending more than  2 midnights.                            Dispo: The patient is from: Home              Anticipated d/c is to: Home              Patient currently is not medically stable to d/c.              Difficult to place patient: No  Hannah Beat M.D on 02/23/2022 at 3:15 AM  [facility]   From 7 PM-7 AM, contact night-coverage [email or web address]  CC: Primary care physician; [facility]

## 2022-02-23 NOTE — Assessment & Plan Note (Signed)
-   We will continue her antidepressants.

## 2022-02-23 NOTE — Assessment & Plan Note (Signed)
-   The patient will be placed on supplement coverage with NovoLog. - We will continue Jardiance. - We will continue Neurontin.

## 2022-02-23 NOTE — Progress Notes (Signed)
Pharmacy Antibiotic Note  Sylvia [name] is a 66 y.o. female admitted on 02/22/2022 with cellulitis.  Pharmacy has been consulted for Aztreonam dosing.  Plan: Aztreonam 2 gm IV Q8H ordered to start on 11/7 @ 0300.   Height: 5\' 7"  (170.2 cm) Weight: 61.2 kg (135 lb) IBW/kg (Calculated) : 61.6  Temp (24hrs), Avg:98.1 F (36.7 C), Min:98 F (36.7 C), Max:98.3 F (36.8 C)  Recent Labs  Lab 02/22/22 1957  WBC 8.7  CREATININE 1.03*    Estimated Creatinine Clearance: 51.2 mL/min (A) (by C-G formula based on SCr of 1.03 mg/dL (H)).    Allergies  Allergen Reactions   Vancomycin Rash    Patient developed a rash to injection site and arm a few mintes after starting ABX.    Hydrocodone Rash   Metformin And Related Diarrhea   Penicillins Hives    Has patient had a PCN reaction causing immediate rash, facial/tongue/throat swelling, SOB or lightheadedness with hypotension: Yes Has patient had a PCN reaction causing severe rash involving mucus membranes or skin necrosis: No Has patient had a PCN reaction that required hospitalization: No Has patient had a PCN reaction occurring within the last 10 years: No If all of the above answers are "NO", then may proceed with Cephalosporin use.   Tramadol Itching    Antimicrobials this admission:   >>    >>   Dose adjustments this admission:   Microbiology results:  BCx:   UCx:    Sputum:    MRSA PCR:   Thank you for allowing pharmacy to be a part of this patient's care.  Jamarea Selner D 02/23/2022 2:57 AM

## 2022-02-23 NOTE — ED Notes (Signed)
Bacitracin and non adherent dressing applied to wound. Patient vomiting. States she no longer wants to leave AMA. Dr. Leonides Schanz notified.

## 2022-02-23 NOTE — Inpatient Diabetes Management (Signed)
Inpatient Diabetes Program Recommendations  AACE/ADA: New Consensus Statement on Inpatient Glycemic Control (2015)  Target Ranges:  Prepandial:   less than 140 mg/dL      Peak postprandial:   less than 180 mg/dL (1-2 hours)      Critically ill patients:  140 - 180 mg/dL   Lab Results  Component Value Date   GLUCAP 266 (H) 02/23/2022   HGBA1C 8.2 (H) 09/22/2021    Review of Glycemic Control  Diabetes history: DM2 Outpatient Diabetes medications: Jardiance 10 mg qd, Januvia 100 mg qd, Victoza ?dose qd Current orders for Inpatient glycemic control: Novolog 0-9 units tid, Tradjenta 5 mg qd, Jardiance 10 mg qd  Inpatient Diabetes Program Recommendations:   Novolog correction with CBGs to start @ 12 no on. -Add A1c  to labs -Add 0-5 units hs correction -Consider D/C oral DM meds while in the hospital and can add basal insulin as needed   Thank you, Bethena Roys E. Alexanderia Gorby, RN, MSN, CDE  Diabetes Coordinator Inpatient Glycemic Control Team Team Pager [phone] (8am-5pm) 02/23/2022 10:03 AM

## 2022-02-23 NOTE — Assessment & Plan Note (Signed)
-   We will continue her antihypertensives. 

## 2022-02-23 NOTE — H&P (View-Only) (Signed)
[facility] Vascular Consult Note  MRN : [id number]  Sylvia [name] is a 66 y.o. ([birth year]) female who presents with chief complaint of  Chief Complaint  Patient presents with   Wound Check  .   Consulting Physician: Valente David, MD Reason for consult: Wound Dehiscence and Cellulitis  History of Present Illness: Sylvia [name] is a 66 year old female with a previous medical history significant for anxiety, osteoarthritis, COPD, depression, diabetes mellitus and bilateral below-knee amputations that presented to [facility] emergency department with acute onset left below-knee amputation worsening wound infection with mild dehiscence and cellulitis.  This patient is well-known to our practice.  We have been treating her in the outpatient setting for her mild dehiscence as well as worsening concerns for wound infection.  The patient had a wound culture done which was positive for Staphylococcus aureus on 02/16/2022.  The patient was given Bactrim in the outpatient setting which the sensitivities were positive for.  There was also discussion about her wound dressings.  The patient had previously been utilizing Medihoney however it was suggested that the patient utilize Santyl by her home health nurse.  However this was cost prohibitive for the patient.  She notes this morning that she is feeling unwell due to either the antibiotics given or the pain medication this morning.  Current Facility-Administered Medications  Medication Dose Route Frequency Provider Last Rate Last Admin   acetaminophen (TYLENOL) tablet 650 mg  650 mg Oral Q6H PRN Mansy, Jan A, MD       Or   acetaminophen (TYLENOL) suppository 650 mg  650 mg Rectal Q6H PRN Mansy, Jan A, MD       albuterol (PROVENTIL) (2.5 MG/3ML) 0.083% nebulizer solution 2.5 mg  2.5 mg Inhalation Q6H PRN Mansy, Jan A, MD       apixaban Everlene Balls) tablet 5 mg  5 mg Oral BID Mansy, Jan A, MD       aspirin  chewable tablet 81 mg  81 mg Oral Daily Mansy, Jan A, MD       atorvastatin (LIPITOR) tablet 10 mg  10 mg Oral Daily Mansy, Jan A, MD       aztreonam (AZACTAM) 2 g in sodium chloride 0.9 % 100 mL IVPB  2 g Intravenous Q8H Mansy, Jan A, MD   Stopped at 02/23/22 0405   buPROPion (WELLBUTRIN SR) 12 hr tablet 150 mg  150 mg Oral BID Mansy, Jan A, MD       cyanocobalamin (VITAMIN B12) tablet 500 mcg  500 mcg Oral Daily Mansy, Jan A, MD       docusate sodium (COLACE) capsule 100 mg  100 mg Oral Daily Mansy, Jan A, MD       DULoxetine (CYMBALTA) DR capsule 60 mg  60 mg Oral Daily Mansy, Jan A, MD       empagliflozin (JARDIANCE) tablet 10 mg  10 mg Oral BH-q7a Mansy, Jan A, MD   10 mg at 02/23/22 0541   famotidine (PEPCID) tablet 20 mg  20 mg Oral QHS Mansy, Jan A, MD       ferrous sulfate tablet 325 mg  325 mg Oral Daily Mansy, Jan A, MD       folic acid (FOLVITE) tablet 1 mg  1 mg Oral Daily Mansy, Jan A, MD       gabapentin (NEURONTIN) capsule 900 mg  900 mg Oral BID Mansy, Jan A, MD       hydroxychloroquine (PLAQUENIL) tablet 200 mg  200 mg Oral Daily Mansy, Jan A, MD       insulin aspart (novoLOG) injection 0-9 Units  0-9 Units Subcutaneous TID WC Nicole Kindred A, DO       leptospermum manuka honey (MEDIHONEY) paste 1 Application  1 Application Topical Daily Ezekiel Slocumb, DO       linagliptin (TRADJENTA) tablet 5 mg  5 mg Oral Daily Mansy, Jan A, MD       lisinopril (ZESTRIL) tablet 10 mg  10 mg Oral BH-q7a Mansy, Jan A, MD   10 mg at 02/23/22 0541   LORazepam (ATIVAN) tablet 0.5 mg  0.5 mg Oral Q8H PRN Mansy, Jan A, MD       magnesium hydroxide (MILK OF MAGNESIA) suspension 30 mL  30 mL Oral Daily PRN Mansy, Jan A, MD       morphine (PF) 2 MG/ML injection 2 mg  2 mg Intravenous Q4H PRN Mansy, Jan A, MD       multivitamin with minerals tablet 1 tablet  1 tablet Oral Daily Mansy, Jan A, MD       ondansetron [facility]) tablet 4 mg  4 mg Oral Q6H PRN Mansy, Jan A, MD       Or   ondansetron  [place]) injection 4 mg  4 mg Intravenous Q6H PRN Mansy, Jan A, MD   4 mg at 02/23/22 4401   oxyCODONE (Oxy IR/ROXICODONE) immediate release tablet 15-30 mg  15-30 mg Oral Q6H PRN Mansy, Jan A, MD       pantoprazole (PROTONIX) EC tablet 40 mg  40 mg Oral BH-q7a Mansy, Jan A, MD   40 mg at 02/23/22 0541   traZODone (DESYREL) tablet 25 mg  25 mg Oral QHS PRN Mansy, Arvella Merles, MD        Past Medical History:  Diagnosis Date   Anemia of chronic disease 05/16/2019   Anxiety    h/o   Arthritis    Cocaine abuse (HCC)    COPD (chronic obstructive pulmonary disease) ([place])    Depression    Diabetes mellitus without complication ([place])    DKA (diabetic ketoacidosis) (HCC)    Elevated troponin    GERD (gastroesophageal reflux disease)    GI bleed    Hypertension    bp under control-off meds since 2019   Ischemic leg     Past Surgical History:  Procedure Laterality Date   ABDOMINAL HYSTERECTOMY     AMPUTATION Right 12/27/2018   Procedure: AMPUTATION BELOW KNEE;  Surgeon: Algernon Huxley, MD;  Location: [facility];  Service: General;  Laterality: Right;   AMPUTATION Left 11/05/2021   Procedure: AMPUTATION BELOW KNEE;  Surgeon: Algernon Huxley, MD;  Location: [facility];  Service: Vascular;  Laterality: Left;   APPLICATION OF WOUND VAC  10/16/2021   Procedure: APPLICATION OF WOUND VAC;  Surgeon: Algernon Huxley, MD;  Location: [facility];  Service: Vascular;;   CATARACT EXTRACTION W/PHACO Right 03/27/2020   Procedure: CATARACT EXTRACTION PHACO AND INTRAOCULAR LENS PLACEMENT ([place]) RIGHT DIABETIC 7.54 00:52.5;  Surgeon: Birder Robson, MD;  Location: [place];  Service: Ophthalmology;  Laterality: Right;   CATARACT EXTRACTION W/PHACO Left 05/20/2020   Procedure: CATARACT EXTRACTION PHACO AND INTRAOCULAR LENS PLACEMENT ([facility]) LEFT DIABETIC 4.95 00:37.6;  Surgeon: Birder Robson, MD;  Location: [place];  Service: Ophthalmology;  Laterality: Left;  Diabetic - oral meds COVID + 04-24-20    CHOLECYSTECTOMY     COLONOSCOPY WITH PROPOFOL N/A 04/16/2019   Procedure: COLONOSCOPY  WITH PROPOFOL;  Surgeon: Pasty Spillers, MD;  Location: [facility];  Service: Endoscopy;  Laterality: N/A;   COLONOSCOPY WITH PROPOFOL N/A 01/16/2020   Procedure: COLONOSCOPY WITH PROPOFOL;  Surgeon: Pasty Spillers, MD;  Location: [facility];  Service: Endoscopy;  Laterality: N/A;   ENDARTERECTOMY FEMORAL Left 10/16/2021   Procedure: ENDARTERECTOMY FEMORAL;  Surgeon: Annice Needy, MD;  Location: [facility];  Service: Vascular;  Laterality: Left;   ESOPHAGOGASTRODUODENOSCOPY (EGD) WITH PROPOFOL N/A 06/15/2018   Procedure: ESOPHAGOGASTRODUODENOSCOPY (EGD) WITH PROPOFOL;  Surgeon: [place], Boykin Nearing, MD;  Location: [facility];  Service: Gastroenterology;  Laterality: N/A;   ESOPHAGOGASTRODUODENOSCOPY (EGD) WITH PROPOFOL N/A 01/03/2019   Procedure: ESOPHAGOGASTRODUODENOSCOPY (EGD) WITH PROPOFOL;  Surgeon: Midge Minium, MD;  Location: [facility];  Service: Endoscopy;  Laterality: N/A;   ESOPHAGOGASTRODUODENOSCOPY (EGD) WITH PROPOFOL N/A 09/25/2021   Procedure: ESOPHAGOGASTRODUODENOSCOPY (EGD) WITH PROPOFOL;  Surgeon: Midge Minium, MD;  Location: [facility];  Service: Endoscopy;  Laterality: N/A;   GIVENS CAPSULE STUDY  04/16/2019   Procedure: GIVENS CAPSULE STUDY;  Surgeon: Pasty Spillers, MD;  Location: [facility];  Service: Endoscopy;;   GIVENS CAPSULE STUDY N/A 06/25/2019   Procedure: GIVENS CAPSULE STUDY;  Surgeon: Wyline Mood, MD;  Location: [facility];  Service: Gastroenterology;  Laterality: N/A;   LOWER EXTREMITY ANGIOGRAPHY Right 08/21/2018   Procedure: LOWER EXTREMITY ANGIOGRAPHY;  Surgeon: Annice Needy, MD;  Location: [facility];  Service: Cardiovascular;  Laterality: Right;   LOWER EXTREMITY ANGIOGRAPHY Left 08/28/2018   Procedure: LOWER EXTREMITY ANGIOGRAPHY;  Surgeon: Annice Needy, MD;  Location: [facility];  Service: Cardiovascular;  Laterality: Left;    LOWER EXTREMITY ANGIOGRAPHY Right 08/28/2018   Procedure: Lower Extremity Angiography;  Surgeon: Annice Needy, MD;  Location: [facility];  Service: Cardiovascular;  Laterality: Right;   LOWER EXTREMITY ANGIOGRAPHY Right 12/18/2018   Procedure: Lower Extremity Angiography;  Surgeon: Annice Needy, MD;  Location: [facility];  Service: Cardiovascular;  Laterality: Right;   LOWER EXTREMITY ANGIOGRAPHY Right 12/18/2018   Procedure: Lower Extremity Angiography;  Surgeon: Annice Needy, MD;  Location: [facility];  Service: Cardiovascular;  Laterality: Right;   LOWER EXTREMITY ANGIOGRAPHY Left 12/21/2018   Procedure: Lower Extremity Angiography;  Surgeon: Annice Needy, MD;  Location: [facility];  Service: Cardiovascular;  Laterality: Left;   LOWER EXTREMITY ANGIOGRAPHY Right 12/21/2018   Procedure: Lower Extremity Angiography;  Surgeon: Annice Needy, MD;  Location: [facility];  Service: Cardiovascular;  Laterality: Right;   LOWER EXTREMITY ANGIOGRAPHY Left 12/25/2020   Procedure: LOWER EXTREMITY ANGIOGRAPHY;  Surgeon: Annice Needy, MD;  Location: [facility];  Service: Cardiovascular;  Laterality: Left;   LOWER EXTREMITY ANGIOGRAPHY Left 09/21/2021   Procedure: Lower Extremity Angiography;  Surgeon: Annice Needy, MD;  Location: [facility];  Service: Cardiovascular;  Laterality: Left;   LOWER EXTREMITY ANGIOGRAPHY Left 10/13/2021   Procedure: Lower Extremity Angiography;  Surgeon: Renford Dills, MD;  Location: [facility];  Service: Cardiovascular;  Laterality: Left;   LOWER EXTREMITY ANGIOGRAPHY Left 10/14/2021   Procedure: Lower Extremity Angiography;  Surgeon: Annice Needy, MD;  Location: [facility];  Service: Cardiovascular;  Laterality: Left;   LOWER EXTREMITY ANGIOGRAPHY Left 11/02/2021   Procedure: Lower Extremity Angiography;  Surgeon: Annice Needy, MD;  Location: [facility];  Service: Cardiovascular;   Laterality: Left;   LOWER EXTREMITY INTERVENTION N/A 12/22/2018  Procedure: LOWER EXTREMITY INTERVENTION;  Surgeon: Annice Needy, MD;  Location: [facility];  Service: Cardiovascular;  Laterality: N/A;   LOWER EXTREMITY INTERVENTION Left 12/26/2020   Procedure: LOWER EXTREMITY INTERVENTION;  Surgeon: Annice Needy, MD;  Location: [facility];  Service: Cardiovascular;  Laterality: Left;   LOWER EXTREMITY INTERVENTION N/A 09/22/2021   Procedure: LOWER EXTREMITY INTERVENTION;  Surgeon: Annice Needy, MD;  Location: [facility];  Service: Cardiovascular;  Laterality: N/A;    Social History Social History   Tobacco Use   Smoking status: Former    Packs/day: 0.25    Years: 45.00    Total pack years: 11.25    Types: Cigarettes   Smokeless tobacco: Never   Tobacco comments:    quit  Vaping Use   Vaping Use: Never used  Substance Use Topics   Alcohol use: No   Drug use: Not Currently    Types: Cocaine    Comment: last used in April 2021 per patient-last 2 drug tests have been negative for cocaine    Family History Family History  Problem Relation Age of Onset   Breast cancer Mother 54    Allergies  Allergen Reactions   Vancomycin Rash    Patient developed a rash to injection site and arm a few mintes after starting ABX.    Hydrocodone Rash   Metformin And Related Diarrhea   Penicillins Hives    Has patient had a PCN reaction causing immediate rash, facial/tongue/throat swelling, SOB or lightheadedness with hypotension: Yes Has patient had a PCN reaction causing severe rash involving mucus membranes or skin necrosis: No Has patient had a PCN reaction that required hospitalization: No Has patient had a PCN reaction occurring within the last 10 years: No If all of the above answers are "NO", then may proceed with Cephalosporin use.   Tramadol Itching     REVIEW OF SYSTEMS (Negative unless checked)  Constitutional: Weight loss  Fever   Chills Cardiac: Chest pain   Chest pressure   Palpitations   Shortness of breath when laying flat   Shortness of breath at rest   Shortness of breath with exertion. Vascular:  Pain in legs with walking   Pain in legs at rest   Pain in legs when laying flat   Claudication   Pain in feet when walking  Pain in feet at rest  Pain in feet when laying flat   History of DVT   Phlebitis   Swelling in legs   Varicose veins   Non-healing ulcers Pulmonary:   Uses home oxygen   Productive cough   Hemoptysis   Wheeze  COPD   Asthma Neurologic:  Dizziness  Blackouts   Seizures   History of stroke   History of TIA  Aphasia   Temporary blindness   Dysphagia   Weakness or numbness in arms   Weakness or numbness in legs Musculoskeletal:  Arthritis   Joint swelling   Joint pain   Low back pain  Hematologic:  Easy bruising  Easy bleeding   Hypercoagulable state   Anemic  Hepatitis Gastrointestinal:  Blood in stool   Vomiting blood  Gastroesophageal reflux/heartburn   Difficulty swallowing. Genitourinary:  Chronic kidney disease   Difficult urination  Frequent urination  Burning with urination   Blood in urine Skin:  Rashes   Ulcers   Wounds Psychological:  History of anxiety    History of major depression.  Physical Examination  Vitals:  02/23/22 0155 02/23/22 0305 02/23/22 0539 02/23/22 0809  BP: (!) 154/80 137/79 (!) 158/83 133/73  Pulse: 98 94 85 94  Resp: 20 18 20 18   Temp: 98.1 F (36.7 C) 97.9 F (36.6 C) 98.3 F (36.8 C) (!) 97.5 F (36.4 C)  TempSrc: Oral Oral Oral Oral  SpO2: 100% 93% 98% 100%  Weight:      Height:       Body mass index is 21.14 kg/m. Gen:  WD/WN, NAD Head: [place]/AT, No temporalis wasting. Prominent temp pulse not noted. Ear/Nose/Throat: Hearing grossly intact, nares w/o erythema or drainage, oropharynx w/o Erythema/Exudate Eyes: Sclera non-icteric, conjunctiva  clear Neck: Trachea midline.  No JVD.  Pulmonary:  Good air movement, respirations not labored, equal bilaterally.  Cardiac: RRR, normal S1, S2. Vascular: Bilateral below-knee amputations with the left below-knee amputation most recent  Gastrointestinal: soft, non-tender/non-distended. No guarding/reflex.  Musculoskeletal: M/S 5/5 throughout.  Extremities without ischemic changes.  No deformity or atrophy. No edema. Neurologic: Sensation grossly intact in extremities.  Symmetrical.  Speech is fluent. Motor exam as listed above. Psychiatric: Judgment intact, Mood & affect appropriate for pt's clinical situation. Dermatologic: Small surface opening left below-knee amputation with slough covering area Lymph : No Cervical, Axillary, or Inguinal lymphadenopathy.    CBC Lab Results  Component Value Date   WBC 10.3 02/23/2022   HGB 10.4 (L) 02/23/2022   HCT 30.5 (L) 02/23/2022   MCV 80.5 02/23/2022   PLT 521 (H) 02/23/2022    BMET    Component Value Date/Time   NA 141 02/23/2022 0345   K 3.9 02/23/2022 0345   CL 112 (H) 02/23/2022 0345   CO2 21 (L) 02/23/2022 0345   GLUCOSE 277 (H) 02/23/2022 0345   BUN 16 02/23/2022 0345   CREATININE 1.23 (H) 02/23/2022 0345   CALCIUM 9.7 02/23/2022 0345   GFRNONAA 48 (L) 02/23/2022 0345   GFRAA >60 11/23/2019 1505   Estimated Creatinine Clearance: 42.9 mL/min (A) (by C-G formula based on SCr of 1.23 mg/dL (H)).  COAG Lab Results  Component Value Date   INR 1.1 02/23/2022   INR 1.3 (H) 09/21/2021   INR 1.1 12/25/2020    Radiology DG Knee 2 Views Left  Result Date: 02/23/2022 CLINICAL DATA:  Left stump infection. EXAM: LEFT KNEE - 1-2 VIEW COMPARISON:  None Available. FINDINGS: Below the knee amputation of the left lower extremity is noted. Mild to moderate severity soft tissue swelling is seen along the stump site. A superficial 10 mm soft tissue defect and an adjacent surgical staple are also noted within this region. A vascular stent  is seen along the posterior aspect of the left femur. This extends to the level of the left popliteal fossa. IMPRESSION: Below the knee amputation of the left lower extremity with mild to moderate severity cellulitis along the stump site. Electronically Signed   By: 13/10/2021 M.D.   On: 02/23/2022 00:39      Assessment/Plan Left low knee amputation  The patient has a small area of dehiscence that has a significant amount of slough which has been difficult to treat in the outpatient setting.  Due to the patient's hospitalization we will plan on having this area debrided with a wound VAC placed on tomorrow.  I have discussed this with the patient as well as discussed the risk benefits and alternatives and she is agreeable to proceed.  Diabetes Continue sliding scale insulin  Hypertension Continue home medication as directed by primary service  Plan of care  discussed with Dr. Wyn Quaker and he is in agreement with plan noted above.   Family Communication: None at bedside  Total Time: 75 minutes I spent 75 minutes in this encounter including personally reviewing extensive medical records, personally reviewing imaging studies and compared to prior scans, counseling the patient, placing orders, coordinating care and performing appropriate documentation  Thank you for allowing [place] to participate in the care of this patient.   Georgiana Spinner, NP [facility] [phone] (Office Phone) [phone] (Office Fax) [phone] (Pager)  02/23/2022 9:44 AM  Staff may message me via secure chat in Epic  but this may not receive immediate response,  please page for urgent matters!  Dictation software was used to generate the above note. Typos may occur and escape review, as with typed/written notes. Any error is purely unintentional.  Please contact me directly for clarity if needed.

## 2022-02-23 NOTE — Consult Note (Signed)
[facility] Nurse Consult Note: Patient receiving care in [facility]. Vascular surgery has been consulted for this patient. Reason for Consult: LLE wound infection Wound type: surgical The patient was seen by Vascular Surgery NP Eulogio Ditch on 02/16/22.  The patient was prescribed santyl at that time.  There were cost issues associated with using Santyl.  Today I have ordered Medihoney as this is a product that will assist in debridement and is much less expensive.  If Vascular specialists see the patient and prescribe a different topical therapy, this order should be discontinued.  Apply a nickel thick layer of Medihoney to the LLE stump wound. Cover with dry gauze then a foam dressing.  [facility] nurse will not follow at this time.  Please re-consult the [facility] team if needed.  Val Riles, RN, MSN, CWOCN, CNS-BC, pager [phone]

## 2022-02-23 NOTE — Assessment & Plan Note (Signed)
-   The patient will be admitted to a medical bed. - Pain management will be provided. - Wound care consult to be obtained. - We will continue her on IV aztreonam. - Vascular surgery consult will be obtained. - I notified Dr. Delana Meyer about patient.

## 2022-02-23 NOTE — Consult Note (Signed)
[facility] Vascular Consult Note  MRN : [id number]  Sylvia [name] is a 66 y.o. ([birth year]) female who presents with chief complaint of  Chief Complaint  Patient presents with   Wound Check  .   Consulting Physician: Valente David, MD Reason for consult: Wound Dehiscence and Cellulitis  History of Present Illness: Sylvia [name] is a 66 year old female with a previous medical history significant for anxiety, osteoarthritis, COPD, depression, diabetes mellitus and bilateral below-knee amputations that presented to [facility] emergency department with acute onset left below-knee amputation worsening wound infection with mild dehiscence and cellulitis.  This patient is well-known to our practice.  We have been treating her in the outpatient setting for her mild dehiscence as well as worsening concerns for wound infection.  The patient had a wound culture done which was positive for Staphylococcus aureus on 02/16/2022.  The patient was given Bactrim in the outpatient setting which the sensitivities were positive for.  There was also discussion about her wound dressings.  The patient had previously been utilizing Medihoney however it was suggested that the patient utilize Santyl by her home health nurse.  However this was cost prohibitive for the patient.  She notes this morning that she is feeling unwell due to either the antibiotics given or the pain medication this morning.  Current Facility-Administered Medications  Medication Dose Route Frequency Provider Last Rate Last Admin   acetaminophen (TYLENOL) tablet 650 mg  650 mg Oral Q6H PRN Mansy, Jan A, MD       Or   acetaminophen (TYLENOL) suppository 650 mg  650 mg Rectal Q6H PRN Mansy, Jan A, MD       albuterol (PROVENTIL) (2.5 MG/3ML) 0.083% nebulizer solution 2.5 mg  2.5 mg Inhalation Q6H PRN Mansy, Jan A, MD       apixaban Everlene Balls) tablet 5 mg  5 mg Oral BID Mansy, Jan A, MD       aspirin  chewable tablet 81 mg  81 mg Oral Daily Mansy, Jan A, MD       atorvastatin (LIPITOR) tablet 10 mg  10 mg Oral Daily Mansy, Jan A, MD       aztreonam (AZACTAM) 2 g in sodium chloride 0.9 % 100 mL IVPB  2 g Intravenous Q8H Mansy, Jan A, MD   Stopped at 02/23/22 0405   buPROPion (WELLBUTRIN SR) 12 hr tablet 150 mg  150 mg Oral BID Mansy, Jan A, MD       cyanocobalamin (VITAMIN B12) tablet 500 mcg  500 mcg Oral Daily Mansy, Jan A, MD       docusate sodium (COLACE) capsule 100 mg  100 mg Oral Daily Mansy, Jan A, MD       DULoxetine (CYMBALTA) DR capsule 60 mg  60 mg Oral Daily Mansy, Jan A, MD       empagliflozin (JARDIANCE) tablet 10 mg  10 mg Oral BH-q7a Mansy, Jan A, MD   10 mg at 02/23/22 0541   famotidine (PEPCID) tablet 20 mg  20 mg Oral QHS Mansy, Jan A, MD       ferrous sulfate tablet 325 mg  325 mg Oral Daily Mansy, Jan A, MD       folic acid (FOLVITE) tablet 1 mg  1 mg Oral Daily Mansy, Jan A, MD       gabapentin (NEURONTIN) capsule 900 mg  900 mg Oral BID Mansy, Jan A, MD       hydroxychloroquine (PLAQUENIL) tablet 200 mg  200 mg Oral Daily Mansy, Jan A, MD       insulin aspart (novoLOG) injection 0-9 Units  0-9 Units Subcutaneous TID WC Nicole Kindred A, DO       leptospermum manuka honey (MEDIHONEY) paste 1 Application  1 Application Topical Daily Ezekiel Slocumb, DO       linagliptin (TRADJENTA) tablet 5 mg  5 mg Oral Daily Mansy, Jan A, MD       lisinopril (ZESTRIL) tablet 10 mg  10 mg Oral BH-q7a Mansy, Jan A, MD   10 mg at 02/23/22 0541   LORazepam (ATIVAN) tablet 0.5 mg  0.5 mg Oral Q8H PRN Mansy, Jan A, MD       magnesium hydroxide (MILK OF MAGNESIA) suspension 30 mL  30 mL Oral Daily PRN Mansy, Jan A, MD       morphine (PF) 2 MG/ML injection 2 mg  2 mg Intravenous Q4H PRN Mansy, Jan A, MD       multivitamin with minerals tablet 1 tablet  1 tablet Oral Daily Mansy, Jan A, MD       ondansetron [facility]) tablet 4 mg  4 mg Oral Q6H PRN Mansy, Jan A, MD       Or   ondansetron  [place]) injection 4 mg  4 mg Intravenous Q6H PRN Mansy, Jan A, MD   4 mg at 02/23/22 4401   oxyCODONE (Oxy IR/ROXICODONE) immediate release tablet 15-30 mg  15-30 mg Oral Q6H PRN Mansy, Jan A, MD       pantoprazole (PROTONIX) EC tablet 40 mg  40 mg Oral BH-q7a Mansy, Jan A, MD   40 mg at 02/23/22 0541   traZODone (DESYREL) tablet 25 mg  25 mg Oral QHS PRN Mansy, Arvella Merles, MD        Past Medical History:  Diagnosis Date   Anemia of chronic disease 05/16/2019   Anxiety    h/o   Arthritis    Cocaine abuse (HCC)    COPD (chronic obstructive pulmonary disease) ([place])    Depression    Diabetes mellitus without complication ([place])    DKA (diabetic ketoacidosis) (HCC)    Elevated troponin    GERD (gastroesophageal reflux disease)    GI bleed    Hypertension    bp under control-off meds since 2019   Ischemic leg     Past Surgical History:  Procedure Laterality Date   ABDOMINAL HYSTERECTOMY     AMPUTATION Right 12/27/2018   Procedure: AMPUTATION BELOW KNEE;  Surgeon: Algernon Huxley, MD;  Location: [facility];  Service: General;  Laterality: Right;   AMPUTATION Left 11/05/2021   Procedure: AMPUTATION BELOW KNEE;  Surgeon: Algernon Huxley, MD;  Location: [facility];  Service: Vascular;  Laterality: Left;   APPLICATION OF WOUND VAC  10/16/2021   Procedure: APPLICATION OF WOUND VAC;  Surgeon: Algernon Huxley, MD;  Location: [facility];  Service: Vascular;;   CATARACT EXTRACTION W/PHACO Right 03/27/2020   Procedure: CATARACT EXTRACTION PHACO AND INTRAOCULAR LENS PLACEMENT ([place]) RIGHT DIABETIC 7.54 00:52.5;  Surgeon: Birder Robson, MD;  Location: [place];  Service: Ophthalmology;  Laterality: Right;   CATARACT EXTRACTION W/PHACO Left 05/20/2020   Procedure: CATARACT EXTRACTION PHACO AND INTRAOCULAR LENS PLACEMENT ([facility]) LEFT DIABETIC 4.95 00:37.6;  Surgeon: Birder Robson, MD;  Location: [place];  Service: Ophthalmology;  Laterality: Left;  Diabetic - oral meds COVID + 04-24-20    CHOLECYSTECTOMY     COLONOSCOPY WITH PROPOFOL N/A 04/16/2019   Procedure: COLONOSCOPY  WITH PROPOFOL;  Surgeon: Pasty Spillers, MD;  Location: [facility];  Service: Endoscopy;  Laterality: N/A;   COLONOSCOPY WITH PROPOFOL N/A 01/16/2020   Procedure: COLONOSCOPY WITH PROPOFOL;  Surgeon: Pasty Spillers, MD;  Location: [facility];  Service: Endoscopy;  Laterality: N/A;   ENDARTERECTOMY FEMORAL Left 10/16/2021   Procedure: ENDARTERECTOMY FEMORAL;  Surgeon: Annice Needy, MD;  Location: [facility];  Service: Vascular;  Laterality: Left;   ESOPHAGOGASTRODUODENOSCOPY (EGD) WITH PROPOFOL N/A 06/15/2018   Procedure: ESOPHAGOGASTRODUODENOSCOPY (EGD) WITH PROPOFOL;  Surgeon: [place], Boykin Nearing, MD;  Location: [facility];  Service: Gastroenterology;  Laterality: N/A;   ESOPHAGOGASTRODUODENOSCOPY (EGD) WITH PROPOFOL N/A 01/03/2019   Procedure: ESOPHAGOGASTRODUODENOSCOPY (EGD) WITH PROPOFOL;  Surgeon: Midge Minium, MD;  Location: [facility];  Service: Endoscopy;  Laterality: N/A;   ESOPHAGOGASTRODUODENOSCOPY (EGD) WITH PROPOFOL N/A 09/25/2021   Procedure: ESOPHAGOGASTRODUODENOSCOPY (EGD) WITH PROPOFOL;  Surgeon: Midge Minium, MD;  Location: [facility];  Service: Endoscopy;  Laterality: N/A;   GIVENS CAPSULE STUDY  04/16/2019   Procedure: GIVENS CAPSULE STUDY;  Surgeon: Pasty Spillers, MD;  Location: [facility];  Service: Endoscopy;;   GIVENS CAPSULE STUDY N/A 06/25/2019   Procedure: GIVENS CAPSULE STUDY;  Surgeon: Wyline Mood, MD;  Location: [facility];  Service: Gastroenterology;  Laterality: N/A;   LOWER EXTREMITY ANGIOGRAPHY Right 08/21/2018   Procedure: LOWER EXTREMITY ANGIOGRAPHY;  Surgeon: Annice Needy, MD;  Location: [facility];  Service: Cardiovascular;  Laterality: Right;   LOWER EXTREMITY ANGIOGRAPHY Left 08/28/2018   Procedure: LOWER EXTREMITY ANGIOGRAPHY;  Surgeon: Annice Needy, MD;  Location: [facility];  Service: Cardiovascular;  Laterality: Left;    LOWER EXTREMITY ANGIOGRAPHY Right 08/28/2018   Procedure: Lower Extremity Angiography;  Surgeon: Annice Needy, MD;  Location: [facility];  Service: Cardiovascular;  Laterality: Right;   LOWER EXTREMITY ANGIOGRAPHY Right 12/18/2018   Procedure: Lower Extremity Angiography;  Surgeon: Annice Needy, MD;  Location: [facility];  Service: Cardiovascular;  Laterality: Right;   LOWER EXTREMITY ANGIOGRAPHY Right 12/18/2018   Procedure: Lower Extremity Angiography;  Surgeon: Annice Needy, MD;  Location: [facility];  Service: Cardiovascular;  Laterality: Right;   LOWER EXTREMITY ANGIOGRAPHY Left 12/21/2018   Procedure: Lower Extremity Angiography;  Surgeon: Annice Needy, MD;  Location: [facility];  Service: Cardiovascular;  Laterality: Left;   LOWER EXTREMITY ANGIOGRAPHY Right 12/21/2018   Procedure: Lower Extremity Angiography;  Surgeon: Annice Needy, MD;  Location: [facility];  Service: Cardiovascular;  Laterality: Right;   LOWER EXTREMITY ANGIOGRAPHY Left 12/25/2020   Procedure: LOWER EXTREMITY ANGIOGRAPHY;  Surgeon: Annice Needy, MD;  Location: [facility];  Service: Cardiovascular;  Laterality: Left;   LOWER EXTREMITY ANGIOGRAPHY Left 09/21/2021   Procedure: Lower Extremity Angiography;  Surgeon: Annice Needy, MD;  Location: [facility];  Service: Cardiovascular;  Laterality: Left;   LOWER EXTREMITY ANGIOGRAPHY Left 10/13/2021   Procedure: Lower Extremity Angiography;  Surgeon: Renford Dills, MD;  Location: [facility];  Service: Cardiovascular;  Laterality: Left;   LOWER EXTREMITY ANGIOGRAPHY Left 10/14/2021   Procedure: Lower Extremity Angiography;  Surgeon: Annice Needy, MD;  Location: [facility];  Service: Cardiovascular;  Laterality: Left;   LOWER EXTREMITY ANGIOGRAPHY Left 11/02/2021   Procedure: Lower Extremity Angiography;  Surgeon: Annice Needy, MD;  Location: [facility];  Service: Cardiovascular;   Laterality: Left;   LOWER EXTREMITY INTERVENTION N/A 12/22/2018  Procedure: LOWER EXTREMITY INTERVENTION;  Surgeon: Dew, Jason S, MD;  Location: [facility];  Service: Cardiovascular;  Laterality: N/A;   LOWER EXTREMITY INTERVENTION Left 12/26/2020   Procedure: LOWER EXTREMITY INTERVENTION;  Surgeon: Dew, Jason S, MD;  Location: [facility];  Service: Cardiovascular;  Laterality: Left;   LOWER EXTREMITY INTERVENTION N/A 09/22/2021   Procedure: LOWER EXTREMITY INTERVENTION;  Surgeon: Dew, Jason S, MD;  Location: [facility];  Service: Cardiovascular;  Laterality: N/A;    Social History Social History   Tobacco Use   Smoking status: Former    Packs/day: 0.25    Years: 45.00    Total pack years: 11.25    Types: Cigarettes   Smokeless tobacco: Never   Tobacco comments:    quit  Vaping Use   Vaping Use: Never used  Substance Use Topics   Alcohol use: No   Drug use: Not Currently    Types: Cocaine    Comment: last used in April 2021 per patient-last 2 drug tests have been negative for cocaine    Family History Family History  Problem Relation Age of Onset   Breast cancer Mother 73    Allergies  Allergen Reactions   Vancomycin Rash    Patient developed a rash to injection site and arm a few mintes after starting ABX.    Hydrocodone Rash   Metformin And Related Diarrhea   Penicillins Hives    Has patient had a PCN reaction causing immediate rash, facial/tongue/throat swelling, SOB or lightheadedness with hypotension: Yes Has patient had a PCN reaction causing severe rash involving mucus membranes or skin necrosis: No Has patient had a PCN reaction that required hospitalization: No Has patient had a PCN reaction occurring within the last 10 years: No If all of the above answers are "NO", then may proceed with Cephalosporin use.   Tramadol Itching     REVIEW OF SYSTEMS (Negative unless checked)  Constitutional: []Weight loss  []Fever   []Chills Cardiac: []Chest pain   []Chest pressure   []Palpitations   []Shortness of breath when laying flat   []Shortness of breath at rest   []Shortness of breath with exertion. Vascular:  []Pain in legs with walking   []Pain in legs at rest   []Pain in legs when laying flat   []Claudication   []Pain in feet when walking  []Pain in feet at rest  []Pain in feet when laying flat   []History of DVT   []Phlebitis   []Swelling in legs   []Varicose veins   []Non-healing ulcers Pulmonary:   []Uses home oxygen   []Productive cough   []Hemoptysis   []Wheeze  []COPD   []Asthma Neurologic:  []Dizziness  []Blackouts   []Seizures   []History of stroke   []History of TIA  []Aphasia   []Temporary blindness   []Dysphagia   []Weakness or numbness in arms   []Weakness or numbness in legs Musculoskeletal:  []Arthritis   []Joint swelling   []Joint pain   []Low back pain  Hematologic:  []Easy bruising  []Easy bleeding   []Hypercoagulable state   []Anemic  []Hepatitis Gastrointestinal:  []Blood in stool   []Vomiting blood  []Gastroesophageal reflux/heartburn   []Difficulty swallowing. Genitourinary:  []Chronic kidney disease   []Difficult urination  []Frequent urination  []Burning with urination   []Blood in urine Skin:  []Rashes   []Ulcers   [x]Wounds Psychological:  [x]History of anxiety   [] History of major depression.  Physical Examination  Vitals:     02/23/22 0155 02/23/22 0305 02/23/22 0539 02/23/22 0809  BP: (!) 154/80 137/79 (!) 158/83 133/73  Pulse: 98 94 85 94  Resp: 20 18 20 18   Temp: 98.1 F (36.7 C) 97.9 F (36.6 C) 98.3 F (36.8 C) (!) 97.5 F (36.4 C)  TempSrc: Oral Oral Oral Oral  SpO2: 100% 93% 98% 100%  Weight:      Height:       Body mass index is 21.14 kg/m. Gen:  WD/WN, NAD Head: [place]/AT, No temporalis wasting. Prominent temp pulse not noted. Ear/Nose/Throat: Hearing grossly intact, nares w/o erythema or drainage, oropharynx w/o Erythema/Exudate Eyes: Sclera non-icteric, conjunctiva  clear Neck: Trachea midline.  No JVD.  Pulmonary:  Good air movement, respirations not labored, equal bilaterally.  Cardiac: RRR, normal S1, S2. Vascular: Bilateral below-knee amputations with the left below-knee amputation most recent  Gastrointestinal: soft, non-tender/non-distended. No guarding/reflex.  Musculoskeletal: M/S 5/5 throughout.  Extremities without ischemic changes.  No deformity or atrophy. No edema. Neurologic: Sensation grossly intact in extremities.  Symmetrical.  Speech is fluent. Motor exam as listed above. Psychiatric: Judgment intact, Mood & affect appropriate for pt's clinical situation. Dermatologic: Small surface opening left below-knee amputation with slough covering area Lymph : No Cervical, Axillary, or Inguinal lymphadenopathy.    CBC Lab Results  Component Value Date   WBC 10.3 02/23/2022   HGB 10.4 (L) 02/23/2022   HCT 30.5 (L) 02/23/2022   MCV 80.5 02/23/2022   PLT 521 (H) 02/23/2022    BMET    Component Value Date/Time   NA 141 02/23/2022 0345   K 3.9 02/23/2022 0345   CL 112 (H) 02/23/2022 0345   CO2 21 (L) 02/23/2022 0345   GLUCOSE 277 (H) 02/23/2022 0345   BUN 16 02/23/2022 0345   CREATININE 1.23 (H) 02/23/2022 0345   CALCIUM 9.7 02/23/2022 0345   GFRNONAA 48 (L) 02/23/2022 0345   GFRAA >60 11/23/2019 1505   Estimated Creatinine Clearance: 42.9 mL/min (A) (by C-G formula based on SCr of 1.23 mg/dL (H)).  COAG Lab Results  Component Value Date   INR 1.1 02/23/2022   INR 1.3 (H) 09/21/2021   INR 1.1 12/25/2020    Radiology DG Knee 2 Views Left  Result Date: 02/23/2022 CLINICAL DATA:  Left stump infection. EXAM: LEFT KNEE - 1-2 VIEW COMPARISON:  None Available. FINDINGS: Below the knee amputation of the left lower extremity is noted. Mild to moderate severity soft tissue swelling is seen along the stump site. A superficial 10 mm soft tissue defect and an adjacent surgical staple are also noted within this region. A vascular stent  is seen along the posterior aspect of the left femur. This extends to the level of the left popliteal fossa. IMPRESSION: Below the knee amputation of the left lower extremity with mild to moderate severity cellulitis along the stump site. Electronically Signed   By: 13/10/2021 M.D.   On: 02/23/2022 00:39      Assessment/Plan Left low knee amputation  The patient has a small area of dehiscence that has a significant amount of slough which has been difficult to treat in the outpatient setting.  Due to the patient's hospitalization we will plan on having this area debrided with a wound VAC placed on tomorrow.  I have discussed this with the patient as well as discussed the risk benefits and alternatives and she is agreeable to proceed.  Diabetes Continue sliding scale insulin  Hypertension Continue home medication as directed by primary service  Plan of care  discussed with Dr. Wyn Quaker and he is in agreement with plan noted above.   Family Communication: None at bedside  Total Time: 75 minutes I spent 75 minutes in this encounter including personally reviewing extensive medical records, personally reviewing imaging studies and compared to prior scans, counseling the patient, placing orders, coordinating care and performing appropriate documentation  Thank you for allowing [place] to participate in the care of this patient.   Georgiana Spinner, NP [facility] [phone] (Office Phone) [phone] (Office Fax) [phone] (Pager)  02/23/2022 9:44 AM  Staff may message me via secure chat in Epic  but this may not receive immediate response,  please page for urgent matters!  Dictation software was used to generate the above note. Typos may occur and escape review, as with typed/written notes. Any error is purely unintentional.  Please contact me directly for clarity if needed.

## 2022-02-23 NOTE — Assessment & Plan Note (Signed)
She will be continued on methotrexate.

## 2022-02-23 NOTE — Assessment & Plan Note (Signed)
-   Sepsis manifested by l tachycardia and tachypnea. - We will continue antibiotic therapy with IV aztreonam. - The patient will be hydrated with IV normal saline. - We will follow blood cultures as well as wound culture.

## 2022-02-23 NOTE — Progress Notes (Signed)
Brief rounding note, same day as admission.  Patient was admitted after midnight with sepsis due to cellulitis from L BKA wound infection. Failed outpatient oral antibiotics (Bactrim DS). Wound culture from 10/21 with pan-sensitive Staph aureus. Treated with IV Clindamycin in the ER and started on IV aztreonam on admission.  Patient reported severe nausea after being given antibiotics, requests change in therapy.  Interval Hx: when seen at bedside on rounds, patient continues to vomit and dry heave.  She has no other complaints.  Agrees n/v got much worse after antibiotics earlier this AM and agrees to change in therapy.     Exam General exam: awake, alert, no acute distress, ill-appearing HEENT: moist mucus membranes, hearing grossly normal  Respiratory system: CTAB, no wheezes, rales or rhonchi, normal respiratory effort. Cardiovascular system: normal S1/S2, RRR, no pedal edema.   Gastrointestinal system: soft, NT, ND Central nervous system: A&O x4. no gross focal neurologic deficits, normal speech Extremities: L BKA with intact dressing on stump, no edema, normal tone Skin: dry, intact, normal temperature Psychiatry: normal mood, congruent affect, judgement and insight appear normal    Assessment & Plan As per H&P by Dr. Sidney Ace and vascular surgery, see their recommendations. Any changes or additions to plan are as follows:  --d/c Zofran (not helpful) --IV Compazine PRN ordered --Vascular plan reviewed - plan for debridement and wound vac placement tomorrow    No charge

## 2022-02-23 NOTE — TOC Initial Note (Signed)
Transition of Care [facility]) - Initial/Assessment Note    Patient Details  Name: Sylvia [name] MRN: [id number] Date of Birth: 09-26-1955  Transition of [facility]) CM/SW Contact:    Candie Chroman, LCSW Phone Number: 02/23/2022, 4:08 PM  Clinical Narrative:   Readmission prevention screen complete. CSW met with patient. No supports at bedside. CSW introduced role and explained that discharge planning would be discussed. PCP is Lovette Cliche, PA-C. Significant other transports her to appointments. Pharmacy is Paediatric nurse on [address]. She also gets mail-in prescriptions through [facility]. No issues obtaining medications. Patient lives home with her significant other and grandson. She is active with [facility] for PT and RN. She has a wheelchair, RW, BSC, and shower chair. No further concerns. CSW encouraged patient to contact CSW as needed. CSW will continue to follow patient for support and facilitate return home when stable. Significant other will transport her home at discharge.               Expected Discharge Plan: [place] Barriers to Discharge: Continued Medical Work up   Patient Goals and CMS Choice     Choice offered to / list presented to : NA  Expected Discharge Plan and Services Expected Discharge Plan: [place] Acute Care Choice: Resumption of Svcs/PTA Provider Living arrangements for the past 2 months: Single Family Home                           HH Arranged: RN, PT [facility] Agency: [place] Date [place]: 02/23/22   Representative spoke with at [place]: Sharmon Revere  Prior Living Arrangements/Services Living arrangements for the past 2 months: [place] Lives with:: Significant Other, Relatives Patient language and need for interpreter reviewed:: Yes Do you feel safe going back to the place where you live?: Yes      Need for Family Participation in Patient Care:  Yes (Comment) Care giver support system in place?: Yes (comment)   Criminal Activity/Legal Involvement Pertinent to Current Situation/Hospitalization: No - Comment as needed  Activities of Daily Living Home Assistive Devices/Equipment: Wheelchair ADL Screening (condition at time of admission) Patient's cognitive ability adequate to safely complete daily activities?: Yes Is the patient deaf or have difficulty hearing?: No Does the patient have difficulty seeing, even when wearing glasses/contacts?: No Does the patient have difficulty concentrating, remembering, or making decisions?: No Patient able to express need for assistance with ADLs?: Yes Does the patient have difficulty dressing or bathing?: Yes Independently performs ADLs?: Yes (appropriate for developmental age) Does the patient have difficulty walking or climbing stairs?: Yes Weakness of Legs: Both Weakness of Arms/Hands: None  Permission Sought/Granted Permission sought to share information with : Facility Art therapist granted to share information with : Yes, Verbal Permission Granted     Permission granted to share info w AGENCY: [facility]        Emotional Assessment Appearance:: Appears stated age Attitude/Demeanor/Rapport: Engaged, Gracious Affect (typically observed): Accepting, Appropriate, Calm, Pleasant Orientation: : Oriented to Self, Oriented to Place, Oriented to  Time, Oriented to Situation Alcohol / Substance Use: Not Applicable Psych Involvement: No (comment)  Admission diagnosis:  Cellulitis of left lower extremity [L03.116] Patient Active Problem List   Diagnosis Date Noted   Cellulitis of left lower extremity 02/23/2022   Sepsis due to cellulitis ([place]) 02/23/2022   Dyslipidemia 02/23/2022  Rheumatoid arthritis ([place]) 02/23/2022   Essential hypertension 02/23/2022   Type 2 diabetes mellitus with peripheral neuropathy ([place]) 02/23/2022   Multiple duodenal ulcers     Ischemic leg 12/25/2020   Rheumatoid arthritis of multiple sites with negative rheumatoid factor ([place]) 08/13/2020   Diabetes mellitus without complication (HCC)    GERD (gastroesophageal reflux disease)    COPD (chronic obstructive pulmonary disease) (HCC)    GIB (gastrointestinal bleeding)    HLD (hyperlipidemia)    Depression    Elevated troponin    Syncope    Elevated troponin I level    Anemia of chronic disease 05/16/2019   Dizziness    Polyp of colon    Acute upper GI bleeding 04/13/2019   Anemia associated with acute blood loss 04/13/2019   Chronic anticoagulation 04/13/2019   Hx of right BKA ([place]) 04/13/2019   Symptomatic anemia 04/13/2019   Upper GI bleed 04/13/2019   Acute GI bleeding 04/13/2019   Phantom pain after amputation of lower extremity ([place]) 01/30/2019   Melena    DU (duodenal ulcer)    DKA (diabetic ketoacidoses) 12/14/2018   Atherosclerosis of artery of extremity with rest pain ([place]) 08/28/2018   PAD (peripheral artery disease) ([place]) 07/14/2018   Abdominal pain 06/13/2018   HTN (hypertension) 06/13/2018   Non-insulin dependent type 2 diabetes mellitus ([place]) 06/13/2018   Elevated lactic acid level 06/13/2018   PCP:  [facility], [place]:   [place], [place] [place] [address] Phone: [phone] Fax: [place] [address] Phone: [phone] Fax: [place] [address]), [place]) [place] [address] Phone: [phone] Fax: [place], [place] [address] [place] [address] Phone: [phone] Fax: [phone]     Social Determinants of Health (SDOH) Interventions    Readmission Risk  Interventions    02/23/2022    4:04 PM  Readmission Risk Prevention Plan  Transportation Screening Complete  PCP or Specialist Appt within 3-5 Days Complete  HRI or [place] Complete  Social Work Consult for [place] Planning/Counseling Complete  Palliative Care Screening Not Applicable  Medication Review Press photographer) Complete

## 2022-02-24 ENCOUNTER — Inpatient Hospital Stay: Payer: Medicare Other | Admitting: Certified Registered"

## 2022-02-24 ENCOUNTER — Other Ambulatory Visit: Payer: Self-pay

## 2022-02-24 ENCOUNTER — Encounter: Payer: Self-pay | Admitting: Family Medicine

## 2022-02-24 ENCOUNTER — Encounter
Admission: EM | Disposition: A | Discharge status: Home Health | Payer: Self-pay | Source: Home / Self Care | Attending: Internal Medicine

## 2022-02-24 DIAGNOSIS — T8744 Infection of amputation stump, left lower extremity: Principal | ICD-10-CM

## 2022-02-24 HISTORY — PX: INCISION AND DRAINAGE OF WOUND: SHX1803

## 2022-02-24 LAB — GLUCOSE, CAPILLARY
Glucose-Capillary: 198 mg/dL — ABNORMAL HIGH (ref 70–99)
Glucose-Capillary: 191 mg/dL — ABNORMAL HIGH (ref 70–99)
Glucose-Capillary: 140 mg/dL — ABNORMAL HIGH (ref 70–99)
Glucose-Capillary: 148 mg/dL — ABNORMAL HIGH (ref 70–99)
Glucose-Capillary: 183 mg/dL — ABNORMAL HIGH (ref 70–99)
Glucose-Capillary: 246 mg/dL — ABNORMAL HIGH (ref 70–99)

## 2022-02-24 LAB — TYPE AND SCREEN
ABO/RH(D): O POS
Antibody Screen: NEGATIVE

## 2022-02-24 SURGERY — IRRIGATION AND DEBRIDEMENT WOUND
Anesthesia: General | Site: Knee | Laterality: Left

## 2022-02-24 MED ORDER — FENTANYL CITRATE (PF) 100 MCG/2ML IJ SOLN
INTRAMUSCULAR | Status: DC | PRN
  Administered 2022-02-24 (×2): 50 ug via INTRAVENOUS

## 2022-02-24 MED ORDER — OXYCODONE HCL 5 MG PO TABS
ORAL_TABLET | ORAL | Status: AC
  Administered 2022-02-24: 5 mg via ORAL
  Filled 2022-02-24: qty 1

## 2022-02-24 MED ORDER — PROPOFOL 1000 MG/100ML IV EMUL
INTRAVENOUS | Status: AC
  Filled 2022-02-24: qty 100

## 2022-02-24 MED ORDER — MIDAZOLAM HCL 2 MG/2ML IJ SOLN
INTRAMUSCULAR | Status: AC
  Filled 2022-02-24: qty 2

## 2022-02-24 MED ORDER — FENTANYL CITRATE (PF) 100 MCG/2ML IJ SOLN
INTRAMUSCULAR | Status: AC
  Administered 2022-02-24: 25 ug via INTRAVENOUS
  Filled 2022-02-24: qty 2

## 2022-02-24 MED ORDER — PHENYLEPHRINE 80 MCG/ML (10ML) SYRINGE FOR IV PUSH (FOR BLOOD PRESSURE SUPPORT)
PREFILLED_SYRINGE | INTRAVENOUS | Status: DC | PRN
  Administered 2022-02-24 (×2): 80 ug via INTRAVENOUS
  Administered 2022-02-24: 160 ug via INTRAVENOUS
  Administered 2022-02-24: 80 ug via INTRAVENOUS
  Administered 2022-02-24: 160 ug via INTRAVENOUS

## 2022-02-24 MED ORDER — PROPOFOL 10 MG/ML IV BOLUS
INTRAVENOUS | Status: DC | PRN
  Administered 2022-02-24 (×2): 30 mg via INTRAVENOUS
  Administered 2022-02-24 (×2): 40 mg via INTRAVENOUS

## 2022-02-24 MED ORDER — LIDOCAINE HCL (PF) 2 % IJ SOLN
INTRAMUSCULAR | Status: AC
  Filled 2022-02-24: qty 5

## 2022-02-24 MED ORDER — CHLORHEXIDINE GLUCONATE CLOTH 2 % EX PADS
6.0000 | MEDICATED_PAD | Freq: Once | CUTANEOUS | Status: AC
  Administered 2022-02-24: 6 via TOPICAL

## 2022-02-24 MED ORDER — BACITRACIN ZINC 500 UNIT/GM EX OINT
TOPICAL_OINTMENT | CUTANEOUS | Status: AC
  Filled 2022-02-24: qty 28.35

## 2022-02-24 MED ORDER — FENTANYL CITRATE (PF) 100 MCG/2ML IJ SOLN
INTRAMUSCULAR | Status: AC
  Filled 2022-02-24: qty 2

## 2022-02-24 MED ORDER — FENTANYL CITRATE (PF) 100 MCG/2ML IJ SOLN
25.0000 ug | INTRAMUSCULAR | Status: DC | PRN
  Administered 2022-02-24 (×3): 25 ug via INTRAVENOUS

## 2022-02-24 MED ORDER — LIDOCAINE HCL (CARDIAC) PF 100 MG/5ML IV SOSY
PREFILLED_SYRINGE | INTRAVENOUS | Status: DC | PRN
  Administered 2022-02-24: 40 mg via INTRAVENOUS

## 2022-02-24 MED ORDER — PHENYLEPHRINE 80 MCG/ML (10ML) SYRINGE FOR IV PUSH (FOR BLOOD PRESSURE SUPPORT)
PREFILLED_SYRINGE | INTRAVENOUS | Status: AC
  Filled 2022-02-24: qty 10

## 2022-02-24 MED ORDER — MIDAZOLAM HCL 2 MG/2ML IJ SOLN
INTRAMUSCULAR | Status: DC | PRN
  Administered 2022-02-24: 2 mg via INTRAVENOUS

## 2022-02-24 MED ORDER — OXYCODONE HCL 5 MG PO TABS: 5.0000 mg | ORAL_TABLET | Freq: Once | ORAL | Status: AC | PRN

## 2022-02-24 MED ORDER — SODIUM CHLORIDE 0.9 % IV SOLN: INTRAVENOUS | Status: DC | PRN

## 2022-02-24 MED ORDER — OXYCODONE HCL 5 MG/5ML PO SOLN: 5.0000 mg | Freq: Once | ORAL | Status: AC | PRN

## 2022-02-24 SURGICAL SUPPLY — 38 items
BNDG GAUZE DERMACEA FLUFF 4 (GAUZE/BANDAGES/DRESSINGS) IMPLANT
BRUSH SCRUB EZ  4% CHG (MISCELLANEOUS) ×1
BRUSH SCRUB EZ 4% CHG (MISCELLANEOUS) ×1 IMPLANT
CANISTER WOUND CARE 500ML ATS (WOUND CARE) IMPLANT
CHLORAPREP W/TINT 26 (MISCELLANEOUS) ×1 IMPLANT
DRAPE INCISE IOBAN 66X45 STRL (DRAPES) ×1 IMPLANT
DRSG VAC ATS MED SENSATRAC (GAUZE/BANDAGES/DRESSINGS) IMPLANT
ELECT CAUTERY BLADE 6.4 (BLADE) ×1 IMPLANT
ELECT REM PT RETURN 9FT ADLT (ELECTROSURGICAL) ×1
ELECTRODE REM PT RTRN 9FT ADLT (ELECTROSURGICAL) ×1 IMPLANT
GAUZE PACKING 0.25INX5YD STRL (GAUZE/BANDAGES/DRESSINGS) IMPLANT
GLOVE BIO SURGEON STRL SZ7 (GLOVE) ×2 IMPLANT
GOWN STRL REUS W/ TWL LRG LVL3 (GOWN DISPOSABLE) ×2 IMPLANT
GOWN STRL REUS W/ TWL XL LVL3 (GOWN DISPOSABLE) ×1 IMPLANT
GOWN STRL REUS W/TWL LRG LVL3 (GOWN DISPOSABLE) ×2
GOWN STRL REUS W/TWL XL LVL3 (GOWN DISPOSABLE) ×1
IV NS 1000ML (IV SOLUTION)
IV NS 1000ML BAXH (IV SOLUTION) ×1 IMPLANT
KIT TURNOVER KIT A (KITS) ×1 IMPLANT
LABEL OR SOLS (LABEL) ×1 IMPLANT
MANIFOLD NEPTUNE II (INSTRUMENTS) ×1 IMPLANT
NS IRRIG 500ML POUR BTL (IV SOLUTION) ×1 IMPLANT
PACK EXTREMITY ARMC (MISCELLANEOUS) ×1 IMPLANT
PAD PREP 24X41 OB/GYN DISP (PERSONAL CARE ITEMS) ×1 IMPLANT
PULSAVAC PLUS IRRIG FAN TIP (DISPOSABLE)
SOL PREP PVP 2OZ (MISCELLANEOUS) ×1
SOLUTION PREP PVP 2OZ (MISCELLANEOUS) ×1 IMPLANT
SPONGE T-LAP 18X18 ~~LOC~~+RFID (SPONGE) ×1 IMPLANT
SUT ETHILON 4-0 (SUTURE)
SUT ETHILON 4-0 FS2 18XMFL BLK (SUTURE)
SUT VIC AB 3-0 SH 27 (SUTURE)
SUT VIC AB 3-0 SH 27X BRD (SUTURE) IMPLANT
SUTURE ETHLN 4-0 FS2 18XMF BLK (SUTURE) IMPLANT
SWAB CULTURE AMIES ANAERIB BLU (MISCELLANEOUS) ×1 IMPLANT
SYR BULB IRRIG 60ML STRL (SYRINGE) ×1 IMPLANT
TIP FAN IRRIG PULSAVAC PLUS (DISPOSABLE) ×1 IMPLANT
TRAP FLUID SMOKE EVACUATOR (MISCELLANEOUS) ×1 IMPLANT
WATER STERILE IRR 500ML POUR (IV SOLUTION) ×1 IMPLANT

## 2022-02-24 NOTE — Op Note (Signed)
    OPERATIVE NOTE   PROCEDURE: Irrigation and debridement of skin and soft tissue approximately 10 cm to the left below-knee amputation site  PRE-OPERATIVE DIAGNOSIS: Nonviable tissue and infection of left below-knee amputation site  POST-OPERATIVE DIAGNOSIS: Same as above  SURGEON: Leotis Pain, MD  ASSISTANT(S): Annalee Genta, NP  ANESTHESIA: MAC  ESTIMATED BLOOD LOSS: 5 cc  FINDING(S): None  SPECIMEN(S): None  INDICATIONS:   Sylvia [name] is a 66 y.o. female who presents with fibrinous exudate and some depth to her medial aspect of her right below-knee amputation wound.  She has not been able to [place] at home because it is cost prohibitive.  She is brought in for debridement while she is in hospital already.  Risks and benefits are discussed.  DESCRIPTION: After obtaining full informed written consent, the patient was brought back to the operating room and placed supine upon the operating table.  The patient received IV antibiotics prior to induction.  After obtaining adequate anesthesia, the patient was prepped and draped in the standard fashion.  The wound was then opened and excisional debridement was performed to the skin and soft tissue to remove all clearly non-viable tissue.  The tissue was taken back to bleeding tissue that appeared viable.  The debridement was performed with Metzenbaum scissors back and encompassed an area of approximately 10 cm2.  The wound was irrigated copiously with saline.  After all clearly non-viable tissue was removed, iodoform gauze was packed into the depth of the wound which was 1 to 2 cm in depth.  A wet to dry dressing was placed and then covered with a Curlex and an Ace wrap. The patient was then awakened from anesthesia and taken to the recovery room in stable condition having tolerated the procedure well.  COMPLICATIONS: none  CONDITION: stable  Leotis Pain  02/24/2022, 1:45 PM   This note was created with Dragon Medical  transcription system. Any errors in dictation are purely unintentional.

## 2022-02-24 NOTE — Interval H&P Note (Signed)
History and Physical Interval Note:  02/24/2022 11:50 AM  Sylvia [name]  has presented today for surgery, with the diagnosis of Left stump infection.  The various methods of treatment have been discussed with the patient and family. After consideration of risks, benefits and other options for treatment, the patient has consented to  Procedure(s): LEFT BKA IRRIGATION AND DEBRIDEMENT WOUND (Left) as a surgical intervention.  The patient's history has been reviewed, patient examined, no change in status, stable for surgery.  I have reviewed the patient's chart and labs.  Questions were answered to the patient's satisfaction.     [name]

## 2022-02-24 NOTE — Transfer of Care (Signed)
Immediate Anesthesia Transfer of Care Note  Patient: Sylvia [name]  Procedure(s) Performed: LEFT BKA IRRIGATION AND DEBRIDEMENT WOUND (Left: Knee)  Patient Location: PACU  Anesthesia Type:General  Level of Consciousness: awake, alert , and oriented  Airway & Oxygen Therapy: Patient Spontanous Breathing and Patient connected to face mask oxygen  Post-op Assessment: Report given to RN, Post -op Vital signs reviewed and stable, and Patient moving all extremities  Post vital signs: Reviewed and stable  Last Vitals:  Vitals Value Taken Time  BP 109/72 02/24/22 1346  Temp 36 C 02/24/22 1345  Pulse 98 02/24/22 1350  Resp 14 02/24/22 1350  SpO2 100 % 02/24/22 1350  Vitals shown include unvalidated device data.  Last Pain:  Vitals:   02/24/22 1131  TempSrc: Temporal  PainSc: 3          Complications: No notable events documented.

## 2022-02-24 NOTE — Anesthesia Preprocedure Evaluation (Addendum)
Anesthesia Evaluation   Patient awake    Reviewed: Allergy & Precautions, NPO status , Patient's Chart, lab work & pertinent test results  History of Anesthesia Complications Negative for: history of anesthetic complications  Airway Mallampati: III   Neck ROM: Full    Dental  (+) Edentulous Lower, Edentulous Upper   Pulmonary neg shortness of breath, COPD,  COPD inhaler, neg recent URI, Patient abstained from smoking., former smoker COVID+ 04/24/20   Pulmonary exam normal breath sounds clear to auscultation       Cardiovascular Exercise Tolerance: Poor hypertension, (-) angina + Peripheral Vascular Disease (s/p right BKA on Eliquis)  (-) Past MI and (-) CABG Normal cardiovascular exam Rhythm:Regular Rate:Normal  bp under control-off meds since 2019   Neuro/Psych  PSYCHIATRIC DISORDERS Anxiety     Chronic pain related to both and prior RLE BKA    GI/Hepatic ,GERD  Controlled and Medicated,,H/o DU   Endo/Other  diabetes, Poorly Controlled, Type 2  last A1c 8.7% Sept 2022  Renal/GU negative Renal ROS     Musculoskeletal  (+) Arthritis , Rheumatoid disorders,  RA on MTX/plaquenil, pred chronically as well as TNF blocker    Abdominal Normal abdominal exam  (+)   Peds  Hematology  (+) Blood dyscrasia, anemia   Anesthesia Other Findings 09/22/11 angiogram w/ angioplasty, TPA administration 6/6 back to OR> angioplasty/PCI popliteal artery 6/27- overnight thrombolytic therapy 6/28- Left lower extremity angiogram 2. Mechanical thrombectomy of the left SFA, popliteal artery, and anterior tibial artery all the way down to the ankle with the penumbra CAT 6 device 3. Percutaneous transluminal angioplasty of left dorsalis pedis artery and distal anterior tibial artery with 2 mm diameter by 22 cm length angioplasty balloon   Reproductive/Obstetrics                              Anesthesia Physical Anesthesia Plan  ASA: 3  Anesthesia Plan: General   Post-op Pain Management:    Induction: Intravenous  PONV Risk Score and Plan: 1 and Ondansetron, Propofol infusion, Midazolam, TIVA and Dexamethasone  Airway Management Planned: Natural Airway and Nasal Cannula  Additional Equipment:   Intra-op Plan:   Post-operative Plan:   Informed Consent: I have reviewed the patients History and Physical, chart, labs and discussed the procedure including the risks, benefits and alternatives for the proposed anesthesia with the patient or authorized representative who has indicated his/her understanding and acceptance.     Dental Advisory Given  Plan Discussed with: Anesthesiologist, CRNA and Surgeon  Anesthesia Plan Comments: (Patient consented for risks of anesthesia including but not limited to:  - adverse reactions to medications - risk of airway placement if required - damage to eyes, teeth, lips or other oral mucosa - nerve damage due to positioning  - sore throat or hoarseness - Damage to heart, brain, nerves, lungs, other parts of body or loss of life  Patient voiced understanding.)        Anesthesia Quick Evaluation

## 2022-02-24 NOTE — Anesthesia Postprocedure Evaluation (Signed)
Anesthesia Post Note  Patient: Sylvia [name]  Procedure(s) Performed: LEFT BKA IRRIGATION AND DEBRIDEMENT WOUND (Left: Knee)  Patient location during evaluation: PACU Anesthesia Type: General Level of consciousness: awake and alert Pain management: pain level controlled Vital Signs Assessment: post-procedure vital signs reviewed and stable Respiratory status: spontaneous breathing, nonlabored ventilation, respiratory function stable and patient connected to nasal cannula oxygen Cardiovascular status: blood pressure returned to baseline and stable Postop Assessment: no apparent nausea or vomiting Anesthetic complications: no  No notable events documented.   Last Vitals:  Vitals:   02/24/22 1433 02/24/22 1443  BP: 123/72 109/73  Pulse: 88 84  Resp: 18 18  Temp:  (!) 36.4 C  SpO2: 100% 100%    Last Pain:  Vitals:   02/24/22 1443  TempSrc: Oral  PainSc:                  Stephanie Coup

## 2022-02-24 NOTE — Anesthesia Procedure Notes (Signed)
Procedure Name: MAC Date/Time: 02/24/2022 1:39 PM  Performed by: Esaw Grandchild, CRNAPre-anesthesia Checklist: Patient identified, Emergency Drugs available, Suction available and Patient being monitored Patient Re-evaluated:Patient Re-evaluated prior to induction Oxygen Delivery Method: Simple face mask

## 2022-02-24 NOTE — Progress Notes (Addendum)
Progress Note    Sylvia [name]  [id number] DOB: 10-21-1955  DOA: 02/22/2022 PCP: [facility] [place]      Brief Narrative:    Medical records reviewed and are as summarized below:  Sylvia [name] is a 66 y.o. female with medical history significant for anxiety, osteoarthritis, cocaine abuse, COPD, depression, type II DM, hypertension, s/p left BKA, who presented to the hospital with BKA complication site wound infection with mild wound dehiscence.  She had failed outpatient treatment with Bactrim.  She was admitted to the hospital for sepsis secondary to left BKA amputation site infection/ cellulitis.     Assessment/Plan:   Principal Problem:   Cellulitis of left lower extremity Active Problems:   Sepsis due to cellulitis (HCC)   Type 2 diabetes mellitus with peripheral neuropathy (HCC)   Depression   Dyslipidemia   Rheumatoid arthritis (HCC)   Essential hypertension    Body mass index is 21.14 kg/m.   Sepsis secondary to left BKA amputation site wound infection/ cellulitis: S/p irrigation and debridement of skin and soft tissue at left BKA amputation site.  Wound culture from 02/16/2022 showed MSSA. Continue IV cefazolin.  Analgesics as needed for pain.   Type 2 DM: Continue empagliflozin and linagliptin.  NovoLog as needed   Rheumatoid arthritis: Continue methotrexate   Other comorbidities include depression, dyslipidemia, HTN    Diet Order             Diet heart healthy/carb modified Room service appropriate? Yes; Fluid consistency: Thin  Diet effective now                            Consultants: Vascular surgeon  Procedures: Irrigation and debridement of skin and soft tissue at left BKA amputation site    Medications:    apixaban  5 mg Oral BID   aspirin  81 mg Oral Daily   atorvastatin  10 mg Oral Daily   cyanocobalamin  500 mcg Oral Daily   docusate sodium  100 mg Oral Daily   DULoxetine   60 mg Oral Daily   empagliflozin  10 mg Oral BH-q7a   famotidine  20 mg Oral QHS   ferrous sulfate  325 mg Oral Daily   folic acid  1 mg Oral Daily   gabapentin  900 mg Oral BID   hydroxychloroquine  200 mg Oral Daily   insulin aspart  0-9 Units Subcutaneous TID WC   linagliptin  5 mg Oral Daily   lisinopril  10 mg Oral BH-q7a   multivitamin with minerals  1 tablet Oral Daily   pantoprazole  40 mg Oral BH-q7a   Continuous Infusions:   ceFAZolin (ANCEF) IV 2 g (02/24/22 0515)     Anti-infectives (From admission, onward)    Start     Dose/Rate Route Frequency Ordered Stop   02/23/22 1100  ceFAZolin (ANCEF) IVPB 2g/100 mL premix        2 g 200 mL/hr over 30 Minutes Intravenous Every 8 hours 02/23/22 0948     02/23/22 1000  hydroxychloroquine (PLAQUENIL) tablet 200 mg        200 mg Oral Daily 02/23/22 0247     02/23/22 0300  aztreonam (AZACTAM) 2 g in sodium chloride 0.9 % 100 mL IVPB  Status:  Discontinued        2 g 200 mL/hr over 30 Minutes Intravenous  Once 02/23/22 0247 02/23/22 0253   02/23/22 0300  aztreonam (AZACTAM) 2 g in sodium chloride 0.9 % 100 mL IVPB  Status:  Discontinued        2 g 200 mL/hr over 30 Minutes Intravenous Every 8 hours 02/23/22 0253 02/23/22 0947   02/23/22 0000  clindamycin (CLEOCIN) IVPB 900 mg        900 mg 100 mL/hr over 30 Minutes Intravenous  Once 02/22/22 2352 02/23/22 0107   02/22/22 2345  linezolid (ZYVOX) IVPB 600 mg  Status:  Discontinued        600 mg 300 mL/hr over 60 Minutes Intravenous  Once 02/22/22 2344 02/22/22 2345              Family Communication/Anticipated D/C date and plan/Code Status   DVT prophylaxis:  apixaban (ELIQUIS) tablet 5 mg     Code Status: Full Code  Family Communication: None Disposition Plan: Plan to discharge home in 2 to 3 days   Status is: Inpatient Remains inpatient appropriate because: IV antibiotics       Subjective:   Interval events noted. C/o pain at left BKA stump site.   She said drainage from the wound has decreased.  She was seen before surgery today.  Objective:    Vitals:   02/24/22 1400 02/24/22 1415 02/24/22 1433 02/24/22 1443  BP: 100/68 115/69 123/72 109/73  Pulse: (!) 103 94 88 84  Resp: 16 19 18 18   Temp:  97.6 F (36.4 C)  (!) 97.5 F (36.4 C)  TempSrc:    Oral  SpO2: 99% 95% 100% 100%  Weight:      Height:       No data found.   Intake/Output Summary (Last 24 hours) at 02/24/2022 1510 Last data filed at 02/24/2022 1342 Gross per 24 hour  Intake 500 ml  Output 410 ml  Net 90 ml   Filed Weights   02/22/22 1942  Weight: 61.2 kg    Exam:  GEN: NAD SKIN: Warm and dry EYES: EOMI ENT: MMM CV: RRR PULM: CTA B ABD: soft, ND, NT, +BS CNS: AAO x 3, non focal EXT: Left BKA with stump tenderness.  Wound noted at the amputation site.          Data Reviewed:   I have personally reviewed following labs and imaging studies:  Labs: Labs show the following:   Basic Metabolic Panel: Recent Labs  Lab 02/22/22 1957 02/23/22 0345  NA 140 141  K 4.1 3.9  CL 110 112*  CO2 21* 21*  GLUCOSE 180* 277*  BUN 15 16  CREATININE 1.03* 1.23*  CALCIUM 9.6 9.7   GFR Estimated Creatinine Clearance: 42.9 mL/min (A) (by C-G formula based on SCr of 1.23 mg/dL (H)). Liver Function Tests: No results for input(s): "AST", "ALT", "ALKPHOS", "BILITOT", "PROT", "ALBUMIN" in the last 168 hours. No results for input(s): "LIPASE", "AMYLASE" in the last 168 hours. No results for input(s): "AMMONIA" in the last 168 hours. Coagulation profile Recent Labs  Lab 02/23/22 0345  INR 1.1    CBC: Recent Labs  Lab 02/22/22 1957 02/23/22 0345  WBC 8.7 10.3  HGB 11.4* 10.4*  HCT 33.2* 30.5*  MCV 80.4 80.5  PLT 547* 521*   Cardiac Enzymes: No results for input(s): "CKTOTAL", "CKMB", "CKMBINDEX", "TROPONINI" in the last 168 hours. BNP (last 3 results) No results for input(s): "PROBNP" in the last 8760 hours. CBG: Recent Labs  Lab  02/23/22 2132 02/24/22 0532 02/24/22 0803 02/24/22 1129 02/24/22 1350  GLUCAP 179* 198* 191* 140* 148*   D-Dimer:  No results for input(s): "DDIMER" in the last 72 hours. Hgb A1c: No results for input(s): "HGBA1C" in the last 72 hours. Lipid Profile: No results for input(s): "CHOL", "HDL", "LDLCALC", "TRIG", "CHOLHDL", "LDLDIRECT" in the last 72 hours. Thyroid function studies: No results for input(s): "TSH", "T4TOTAL", "T3FREE", "THYROIDAB" in the last 72 hours.  Invalid input(s): "FREET3" Anemia work up: No results for input(s): "VITAMINB12", "FOLATE", "FERRITIN", "TIBC", "IRON", "RETICCTPCT" in the last 72 hours. Sepsis Labs: Recent Labs  Lab 02/22/22 1957 02/23/22 0345  PROCALCITON <0.10 <0.10  WBC 8.7 10.3    Microbiology Recent Results (from the past 240 hour(s))  Aerobic culture     Status: Abnormal   Collection Time: 02/16/22 12:01 PM  Result Value Ref Range Status   Aerobic Bacterial Culture Final report (A)  Final   Organism ID, Bacteria Staphylococcus aureus (A)  Final    Comment: Based on susceptibility to oxacillin this isolate would be susceptible to: *Penicillinase-stable penicillins, such as:   Cloxacillin, Dicloxacillin, Nafcillin *Beta-lactam combination agents, such as:   Amoxicillin-clavulanic acid, Ampicillin-sulbactam,   Piperacillin-tazobactam *Oral cephems, such as:   Cefaclor, Cefdinir, Cefpodoxime, Cefprozil, Cefuroxime,   Cephalexin, Loracarbef *Parenteral cephems, such as:   Cefazolin, Cefepime, Cefotaxime, Cefotetan, Ceftaroline,   Ceftizoxime, Ceftriaxone, Cefuroxime *Carbapenems, such as:   Doripenem, Ertapenem, Imipenem, Meropenem Most isolates of Staphylococcus sp. produce a beta-lactamase enzyme rendering them resistant to penicillin. Please contact the laboratory if penicillin is being considered for therapy. Heavy growth    Antimicrobial Susceptibility Comment  Final    Comment:       ** S = Susceptible; I = Intermediate; R =  Resistant **                    P = Positive; N = Negative             MICS are expressed in micrograms per mL    Antibiotic                 RSLT#1    RSLT#2    RSLT#3    RSLT#4 Ciprofloxacin                  S Clindamycin                    S Erythromycin                   S Gentamicin                     S Levofloxacin                   S Linezolid                      S Moxifloxacin                   S Oxacillin                      S Quinupristin/Dalfopristin      S Rifampin                       S Tetracycline                   S Trimethoprim/Sulfa             S Vancomycin  S     Procedures and diagnostic studies:  DG Knee 2 Views Left  Result Date: 02/23/2022 CLINICAL DATA:  Left stump infection. EXAM: LEFT KNEE - 1-2 VIEW COMPARISON:  None Available. FINDINGS: Below the knee amputation of the left lower extremity is noted. Mild to moderate severity soft tissue swelling is seen along the stump site. A superficial 10 mm soft tissue defect and an adjacent surgical staple are also noted within this region. A vascular stent is seen along the posterior aspect of the left femur. This extends to the level of the left popliteal fossa. IMPRESSION: Below the knee amputation of the left lower extremity with mild to moderate severity cellulitis along the stump site. Electronically Signed   By: Aram Candela M.D.   On: 02/23/2022 00:39               LOS: 1 day   Bell Cai  [facility]   Pager on [email or web address]. If 7PM-7AM, please contact night-coverage at [email or web address]     02/24/2022, 3:10 PM

## 2022-02-24 NOTE — TOC Progression Note (Signed)
Transition of Care [facility]) - Progression Note    Patient Details  Name: Sylvia [name] MRN: [id number] Date of Birth: 08-10-1955  Transition of Care [facility]) CM/SW Contact  Margarito Liner, LCSW Phone Number: 02/24/2022, 9:13 AM  Clinical Narrative:  Notified [facility] liaison of plan for wound vac placement today.   Expected Discharge Plan: Home w Home Health Services Barriers to Discharge: Continued Medical Work up  Expected Discharge Plan and Services Expected Discharge Plan: Home w Home Health Services     Post Acute Care Choice: Resumption of Svcs/PTA Provider Living arrangements for the past 2 months: Single Family Home                           HH Arranged: RN, PT [facility] Agency: [facility] Date [facility] Agency Contacted: 02/23/22   Representative spoke with at [facility] Agency: Becky Sax   Social Determinants of Health (SDOH) Interventions    Readmission Risk Interventions    02/23/2022    4:04 PM  Readmission Risk Prevention Plan  Transportation Screening Complete  PCP or Specialist Appt within 3-5 Days Complete  HRI or Home Care Consult Complete  Social Work Consult for Recovery Care Planning/Counseling Complete  Palliative Care Screening Not Applicable  Medication Review Oceanographer) Complete

## 2022-02-25 ENCOUNTER — Encounter: Payer: Self-pay | Admitting: Vascular Surgery

## 2022-02-25 LAB — CBC WITH DIFFERENTIAL/PLATELET
Abs Immature Granulocytes: 0.03 10*3/uL (ref 0.00–0.07)
Basophils Absolute: 0.1 10*3/uL (ref 0.0–0.1)
Basophils Relative: 1 %
Eosinophils Absolute: 0.1 10*3/uL (ref 0.0–0.5)
Eosinophils Relative: 2 %
HCT: 28.1 % — ABNORMAL LOW (ref 36.0–46.0)
Hemoglobin: 9.7 g/dL — ABNORMAL LOW (ref 12.0–15.0)
Immature Granulocytes: 0 %
Lymphocytes Relative: 28 %
Lymphs Abs: 2.4 10*3/uL (ref 0.7–4.0)
MCH: 28 pg (ref 26.0–34.0)
MCHC: 34.5 g/dL (ref 30.0–36.0)
MCV: 81.2 fL (ref 80.0–100.0)
Monocytes Absolute: 0.7 10*3/uL (ref 0.1–1.0)
Monocytes Relative: 8 %
Neutro Abs: 5.3 10*3/uL (ref 1.7–7.7)
Neutrophils Relative %: 61 %
Platelets: 415 10*3/uL — ABNORMAL HIGH (ref 150–400)
RBC: 3.46 MIL/uL — ABNORMAL LOW (ref 3.87–5.11)
RDW: 14.3 % (ref 11.5–15.5)
WBC: 8.7 10*3/uL (ref 4.0–10.5)
nRBC: 0 % (ref 0.0–0.2)

## 2022-02-25 LAB — GLUCOSE, CAPILLARY
Glucose-Capillary: 189 mg/dL — ABNORMAL HIGH (ref 70–99)
Glucose-Capillary: 141 mg/dL — ABNORMAL HIGH (ref 70–99)
Glucose-Capillary: 125 mg/dL — ABNORMAL HIGH (ref 70–99)

## 2022-02-25 LAB — BASIC METABOLIC PANEL
Anion gap: 9 (ref 5–15)
BUN: 24 mg/dL — ABNORMAL HIGH (ref 8–23)
CO2: 21 mmol/L — ABNORMAL LOW (ref 22–32)
Calcium: 9.8 mg/dL (ref 8.9–10.3)
Chloride: 107 mmol/L (ref 98–111)
Creatinine, Ser: 1.17 mg/dL — ABNORMAL HIGH (ref 0.44–1.00)
GFR, Estimated: 51 mL/min — ABNORMAL LOW (ref >60)
Glucose, Bld: 211 mg/dL — ABNORMAL HIGH (ref 70–99)
Potassium: 4.2 mmol/L (ref 3.5–5.1)
Sodium: 137 mmol/L (ref 135–145)

## 2022-02-25 MED ORDER — CLINDAMYCIN HCL 300 MG PO CAPS: 300.0000 mg | ORAL_CAPSULE | Freq: Three times a day (TID) | ORAL | 0 refills | Status: DC

## 2022-02-25 MED ORDER — INSULIN ASPART 100 UNIT/ML IJ SOLN: 0.0000 [IU] | Freq: Every day | INTRAMUSCULAR | Status: DC

## 2022-02-25 MED ORDER — INSULIN ASPART 100 UNIT/ML IJ SOLN
0.0000 [IU] | Freq: Three times a day (TID) | INTRAMUSCULAR | Status: DC
  Administered 2022-02-25: 1 [IU] via SUBCUTANEOUS
  Filled 2022-02-25: qty 1

## 2022-02-25 MED ORDER — CLINDAMYCIN HCL 300 MG PO CAPS: 300.0000 mg | ORAL_CAPSULE | Freq: Three times a day (TID) | ORAL | 0 refills | Status: AC

## 2022-02-25 NOTE — Discharge Summary (Signed)
Physician Discharge Summary   Patient: Sylvia [name] MRN: [id number] DOB: [birth year]  Admit date:     02/22/2022  Discharge date: 02/25/22  Discharge Physician: Lurene Shadow   [facility]: [facility]   Recommendations at discharge:   Follow-up with PCP in 1 to 2 weeks Follow-up with Dr. Wyn Quaker, vascular surgeon, in 2 weeks  Discharge Diagnoses: Principal Problem:   Cellulitis of left lower extremity Active Problems:   Sepsis due to cellulitis (HCC)   Type 2 diabetes mellitus with peripheral neuropathy (HCC)   Depression   Dyslipidemia   Rheumatoid arthritis (HCC)   Essential hypertension  Resolved Problems:   * No resolved hospital problems. [facility] Course:  Sylvia [name] is a 66 y.o. female with medical history significant for anxiety, osteoarthritis, cocaine abuse, COPD, depression, type II DM, hypertension, s/p left BKA, who presented to the hospital with BKA complication site wound infection with mild wound dehiscence.  She had failed outpatient treatment with Bactrim.   She was admitted to the hospital for sepsis secondary to left BKA amputation site infection/ cellulitis.  She was treated with IV antibiotics and analgesics.  She underwent irrigation and debridement of skin and soft tissue at the left BKA amputation site.  Her condition is improved and she is deemed stable for discharge to home today.    Assessment and Plan:  Sepsis secondary to left BKA amputation site wound infection/ cellulitis: S/p irrigation and debridement of skin and soft tissue at left BKA amputation site.  Wound culture from 02/16/2022 showed MSSA.  Discontinue IV cefazolin and discharge patient on clindamycin.  Case was discussed with Dr. Wyn Quaker, vascular surgeon and Sheppard Plumber, NP with vascular surgery team via secure chat.  Wet-to-dry dressing and outpatient follow-up was recommended.     Type 2 DM: Continue empagliflozin and linagliptin.      Rheumatoid  arthritis: Continue methotrexate     Other comorbidities include depression, dyslipidemia, HTN        Pain control - [place] Controlled Substance Reporting System database was reviewed. and patient was instructed, not to drive, operate heavy machinery, perform activities at heights, swimming or participation in water activities or provide baby-sitting services while on Pain, Sleep and Anxiety Medications; until their outpatient Physician has advised to do so again. Also recommended to not to take more than prescribed Pain, Sleep and Anxiety Medications.  Consultants: Vascular surgeon Procedures performed: Irrigation and debridement of skin and soft tissue at left BKA amputation site      Disposition: Home health Diet recommendation:  Discharge Diet Orders (From admission, onward)     Start     Ordered   02/25/22 0000  Diet - low sodium heart healthy        02/25/22 1500           Cardiac and Carb modified diet DISCHARGE MEDICATION: Allergies as of 02/25/2022       Reactions   Vancomycin Rash   Patient developed a rash to injection site and arm a few mintes after starting ABX.    Hydrocodone Rash   Metformin And Related Diarrhea   Penicillins Hives   Has patient had a PCN reaction causing immediate rash, facial/tongue/throat swelling, SOB or lightheadedness with hypotension: Yes Has patient had a PCN reaction causing severe rash involving mucus membranes or skin necrosis: No Has patient had a PCN reaction that required hospitalization: No Has patient had a PCN reaction occurring within the last 10 years: No If all  of the above answers are "NO", then may proceed with Cephalosporin use.   Tramadol Itching        Medication List     STOP taking these medications    aspirin 81 MG chewable tablet   LORazepam 0.5 MG tablet Commonly known as: Ativan   sulfamethoxazole-trimethoprim 800-160 MG tablet Commonly known as: BACTRIM DS       TAKE these  medications    acetaminophen 325 MG tablet Commonly known as: TYLENOL Take 650 mg by mouth every 6 (six) hours as needed.   albuterol 108 (90 Base) MCG/ACT inhaler Commonly known as: VENTOLIN HFA Inhale 2 puffs into the lungs every 6 (six) hours as needed for wheezing or shortness of breath.   apixaban 5 MG Tabs tablet Commonly known as: ELIQUIS Take 1 tablet (5 mg total) by mouth 2 (two) times daily.   atorvastatin 10 MG tablet Commonly known as: LIPITOR Take 10 mg by mouth at bedtime.   cetirizine 10 MG tablet Commonly known as: ZYRTEC Take 10 mg by mouth daily as needed for allergies.   clindamycin 300 MG capsule Commonly known as: CLEOCIN Take 1 capsule (300 mg total) by mouth 3 (three) times daily for 5 days.   collagenase 250 UNIT/GM ointment Commonly known as: SANTYL Apply 1 Application topically daily.   cyanocobalamin 500 MCG tablet Commonly known as: VITAMIN B12 Take 1 tablet (500 mcg total) by mouth daily.   docusate sodium 100 MG capsule Commonly known as: COLACE Take 100 mg by mouth daily.   DULoxetine 60 MG capsule Commonly known as: CYMBALTA Take 1 capsule (60 mg total) by mouth daily.   ferrous sulfate 325 (65 FE) MG tablet Take 325 mg by mouth daily.   folic acid 1 MG tablet Commonly known as: FOLVITE Take 1 mg by mouth daily.   gabapentin 300 MG capsule Commonly known as: NEURONTIN TAKE 3 CAPSULES BY MOUTH IN THE  MORNING AND 3 CAPSULES BY MOUTH  IN THE EVENING   hydroxychloroquine 200 MG tablet Commonly known as: PLAQUENIL Take 200 mg by mouth daily.   Jardiance 10 MG Tabs tablet Generic drug: empagliflozin Take 10 mg by mouth every morning.   lisinopril 10 MG tablet Commonly known as: ZESTRIL Take 1 tablet (10 mg total) by mouth daily. What changed: when to take this   methotrexate 2.5 MG tablet Commonly known as: RHEUMATREX Take 20 mg by mouth once a week. Has not yet started as of 02/23/22   ondansetron 4 MG disintegrating  tablet Commonly known as: ZOFRAN-ODT Take 1 tablet (4 mg total) by mouth every 8 (eight) hours as needed for nausea or vomiting. What changed: Another medication with the same name was added. Make sure you understand how and when to take each.   ondansetron 4 MG disintegrating tablet Commonly known as: ZOFRAN-ODT Take 1 tablet (4 mg total) by mouth every 6 (six) hours as needed for nausea or vomiting. What changed: You were already taking a medication with the same name, and this prescription was added. Make sure you understand how and when to take each.   oxyCODONE 15 MG immediate release tablet Commonly known as: ROXICODONE Take 1-2 tablets (15-30 mg total) by mouth every 6 (six) hours as needed for pain.   pantoprazole 40 MG tablet Commonly known as: PROTONIX Take 1 tablet (40 mg total) by mouth daily. What changed: when to take this   sitaGLIPtin 100 MG tablet Commonly known as: JANUVIA Take 100 mg by mouth every morning.  Victoza 18 MG/3ML Sopn Generic drug: liraglutide Inject into the skin.   WOMENS MULTIVITAMIN PO Take 1 tablet by mouth daily.               Discharge Care Instructions  (From admission, onward)           Start     Ordered   02/25/22 0000  Discharge wound care:       Comments: Continue wet to dry dressing at home of left BKA stump wound per vascular surgeon   02/25/22 1530            Follow-up Information     [facility] Follow up.   Why: They will resume home health services at discharge. Contact information: [address] [place] [address] [phone]                Discharge Exam: Ceasar Mons Weights   02/22/22 1942  Weight: 61.2 kg   GEN: NAD SKIN: Warm and dry EYES: EOMI ENT: MMM CV: RRR PULM: CTA B ABD: soft, ND, NT, +BS CNS: AAO x 3, non focal EXT: Bilateral BKA.  Dressing on left BKA stump wound is clean, dry and intact.  No edema or tenderness   Condition at discharge:  good  The results of significant diagnostics from this hospitalization (including imaging, microbiology, ancillary and laboratory) are listed below for reference.   Imaging Studies: DG Knee 2 Views Left  Result Date: 02/23/2022 CLINICAL DATA:  Left stump infection. EXAM: LEFT KNEE - 1-2 VIEW COMPARISON:  None Available. FINDINGS: Below the knee amputation of the left lower extremity is noted. Mild to moderate severity soft tissue swelling is seen along the stump site. A superficial 10 mm soft tissue defect and an adjacent surgical staple are also noted within this region. A vascular stent is seen along the posterior aspect of the left femur. This extends to the level of the left popliteal fossa. IMPRESSION: Below the knee amputation of the left lower extremity with mild to moderate severity cellulitis along the stump site. Electronically Signed   By: Aram Candela M.D.   On: 02/23/2022 00:39    Microbiology: Results for orders placed or performed in visit on 02/16/22  Aerobic culture     Status: Abnormal   Collection Time: 02/16/22 12:01 PM  Result Value Ref Range Status   Aerobic Bacterial Culture Final report (A)  Final   Organism ID, Bacteria Staphylococcus aureus (A)  Final    Comment: Based on susceptibility to oxacillin this isolate would be susceptible to: *Penicillinase-stable penicillins, such as:   Cloxacillin, Dicloxacillin, Nafcillin *Beta-lactam combination agents, such as:   Amoxicillin-clavulanic acid, Ampicillin-sulbactam,   Piperacillin-tazobactam *Oral cephems, such as:   Cefaclor, Cefdinir, Cefpodoxime, Cefprozil, Cefuroxime,   Cephalexin, Loracarbef *Parenteral cephems, such as:   Cefazolin, Cefepime, Cefotaxime, Cefotetan, Ceftaroline,   Ceftizoxime, Ceftriaxone, Cefuroxime *Carbapenems, such as:   Doripenem, Ertapenem, Imipenem, Meropenem Most isolates of Staphylococcus sp. produce a beta-lactamase enzyme rendering them resistant to penicillin. Please contact  the laboratory if penicillin is being considered for therapy. Heavy growth    Antimicrobial Susceptibility Comment  Final    Comment:       ** S = Susceptible; I = Intermediate; R = Resistant **                    P = Positive; N = Negative             MICS are expressed in micrograms  per mL    Antibiotic                 RSLT#1    RSLT#2    RSLT#3    RSLT#4 Ciprofloxacin                  S Clindamycin                    S Erythromycin                   S Gentamicin                     S Levofloxacin                   S Linezolid                      S Moxifloxacin                   S Oxacillin                      S Quinupristin/Dalfopristin      S Rifampin                       S Tetracycline                   S Trimethoprim/Sulfa             S Vancomycin                     S     Labs: CBC: Recent Labs  Lab 02/22/22 1957 02/23/22 0345 02/25/22 0418  WBC 8.7 10.3 8.7  NEUTROABS  --   --  5.3  HGB 11.4* 10.4* 9.7*  HCT 33.2* 30.5* 28.1*  MCV 80.4 80.5 81.2  PLT 547* 521* 415*   Basic Metabolic Panel: Recent Labs  Lab 02/22/22 1957 02/23/22 0345 02/25/22 0418  NA 140 141 137  K 4.1 3.9 4.2  CL 110 112* 107  CO2 21* 21* 21*  GLUCOSE 180* 277* 211*  BUN 15 16 24*  CREATININE 1.03* 1.23* 1.17*  CALCIUM 9.6 9.7 9.8   Liver Function Tests: No results for input(s): "AST", "ALT", "ALKPHOS", "BILITOT", "PROT", "ALBUMIN" in the last 168 hours. CBG: Recent Labs  Lab 02/24/22 1350 02/24/22 1611 02/24/22 2130 02/25/22 0800 02/25/22 1148  GLUCAP 148* 183* 246* 189* 141*    Discharge time spent: greater than 30 minutes.  Signed: Lurene Shadow, MD [facility] 02/25/2022

## 2022-02-25 NOTE — TOC Transition Note (Signed)
Transition of Care [facility]) - CM/SW Discharge Note   Patient Details  Name: Sylvia [name] MRN: [id number] Date of Birth: 04-Nov-1955  Transition of Care [facility]) CM/SW Contact:  Margarito Liner, LCSW Phone Number: 02/25/2022, 3:36 PM   Clinical Narrative: Patient has orders to discharge home today. Left message for [facility] liaison to notify. Asked RN to provide wound care education to patient and family and provide supplies for dressing changes. No further concerns. CSW signing off.  Final next level of care: Home w Home Health Services Barriers to Discharge: Barriers Resolved   Patient Goals and CMS Choice     Choice offered to / list presented to : NA  Discharge Placement                Patient to be transferred to facility by: Significant other   Patient and family notified of of transfer: 02/25/22  Discharge Plan and Services     Post Acute Care Choice: Resumption of Svcs/PTA Provider                    HH Arranged: PT, RN [facility] Agency: [facility] Date [facility] Agency Contacted: 02/25/22   Representative spoke with at [facility] Agency: Becky Sax  Social Determinants of Health (SDOH) Interventions     Readmission Risk Interventions    02/23/2022    4:04 PM  Readmission Risk Prevention Plan  Transportation Screening Complete  PCP or Specialist Appt within 3-5 Days Complete  HRI or Home Care Consult Complete  Social Work Consult for Recovery Care Planning/Counseling Complete  Palliative Care Screening Not Applicable  Medication Review Oceanographer) Complete

## 2022-02-25 NOTE — TOC Progression Note (Signed)
Transition of Care [facility]) - Progression Note    Patient Details  Name: Lenka Zhao MRN: [id number] Date of Birth: 25-Dec-1955  Transition of Care  [facility]) CM/SW Contact  Margarito Liner, LCSW Phone Number: 02/25/2022, 8:36 AM  Clinical Narrative:  Notified [facility] liaison late yesterday that wound vac was not needed.   Expected Discharge Plan: Home w Home Health Services Barriers to Discharge: Continued Medical Work up  Expected Discharge Plan and Services Expected Discharge Plan: Home w Home Health Services     Post Acute Care Choice: Resumption of Svcs/PTA Provider Living arrangements for the past 2 months: Single Family Home                           HH Arranged: RN, PT [facility] Agency: [facility] Date [facility] Agency Contacted: 02/23/22   Representative spoke with at [facility] Agency: Becky Sax   Social Determinants of Health (SDOH) Interventions    Readmission Risk Interventions    02/23/2022    4:04 PM  Readmission Risk Prevention Plan  Transportation Screening Complete  PCP or Specialist Appt within 3-5 Days Complete  HRI or Home Care Consult Complete  Social Work Consult for Recovery Care Planning/Counseling Complete  Palliative Care Screening Not Applicable  Medication Review Oceanographer) Complete

## 2022-02-25 NOTE — Progress Notes (Signed)
[name] to be D/C'd Home per MD order.  Discussed prescriptions and follow up appointments with the patient. Prescriptions given to patient, medication list explained in detail. Pt verbalized understanding.  Allergies as of 02/25/2022       Reactions   Vancomycin Rash   Patient developed a rash to injection site and arm a few mintes after starting ABX.    Hydrocodone Rash   Metformin And Related Diarrhea   Penicillins Hives   Has patient had a PCN reaction causing immediate rash, facial/tongue/throat swelling, SOB or lightheadedness with hypotension: Yes Has patient had a PCN reaction causing severe rash involving mucus membranes or skin necrosis: No Has patient had a PCN reaction that required hospitalization: No Has patient had a PCN reaction occurring within the last 10 years: No If all of the above answers are "NO", then may proceed with Cephalosporin use.   Tramadol Itching        Medication List     STOP taking these medications    aspirin 81 MG chewable tablet   LORazepam 0.5 MG tablet Commonly known as: Ativan   sulfamethoxazole-trimethoprim 800-160 MG tablet Commonly known as: BACTRIM DS       TAKE these medications    acetaminophen 325 MG tablet Commonly known as: TYLENOL Take 650 mg by mouth every 6 (six) hours as needed.   albuterol 108 (90 Base) MCG/ACT inhaler Commonly known as: VENTOLIN HFA Inhale 2 puffs into the lungs every 6 (six) hours as needed for wheezing or shortness of breath.   apixaban 5 MG Tabs tablet Commonly known as: ELIQUIS Take 1 tablet (5 mg total) by mouth 2 (two) times daily.   atorvastatin 10 MG tablet Commonly known as: LIPITOR Take 10 mg by mouth at bedtime.   cetirizine 10 MG tablet Commonly known as: ZYRTEC Take 10 mg by mouth daily as needed for allergies.   clindamycin 300 MG capsule Commonly known as: CLEOCIN Take 1 capsule (300 mg total) by mouth 3 (three) times daily for 5 days.   collagenase 250  UNIT/GM ointment Commonly known as: SANTYL Apply 1 Application topically daily.   cyanocobalamin 500 MCG tablet Commonly known as: VITAMIN B12 Take 1 tablet (500 mcg total) by mouth daily.   docusate sodium 100 MG capsule Commonly known as: COLACE Take 100 mg by mouth daily.   DULoxetine 60 MG capsule Commonly known as: CYMBALTA Take 1 capsule (60 mg total) by mouth daily.   ferrous sulfate 325 (65 FE) MG tablet Take 325 mg by mouth daily.   folic acid 1 MG tablet Commonly known as: FOLVITE Take 1 mg by mouth daily.   gabapentin 300 MG capsule Commonly known as: NEURONTIN TAKE 3 CAPSULES BY MOUTH IN THE  MORNING AND 3 CAPSULES BY MOUTH  IN THE EVENING   hydroxychloroquine 200 MG tablet Commonly known as: PLAQUENIL Take 200 mg by mouth daily.   Jardiance 10 MG Tabs tablet Generic drug: empagliflozin Take 10 mg by mouth every morning.   lisinopril 10 MG tablet Commonly known as: ZESTRIL Take 1 tablet (10 mg total) by mouth daily. What changed: when to take this   methotrexate 2.5 MG tablet Commonly known as: RHEUMATREX Take 20 mg by mouth once a week. Has not yet started as of 02/23/22   ondansetron 4 MG disintegrating tablet Commonly known as: ZOFRAN-ODT Take 1 tablet (4 mg total) by mouth every 8 (eight) hours as needed for nausea or vomiting. What changed: Another medication with the same name  was added. Make sure you understand how and when to take each.   ondansetron 4 MG disintegrating tablet Commonly known as: ZOFRAN-ODT Take 1 tablet (4 mg total) by mouth every 6 (six) hours as needed for nausea or vomiting. What changed: You were already taking a medication with the same name, and this prescription was added. Make sure you understand how and when to take each.   oxyCODONE 15 MG immediate release tablet Commonly known as: ROXICODONE Take 1-2 tablets (15-30 mg total) by mouth every 6 (six) hours as needed for pain.   pantoprazole 40 MG tablet Commonly  known as: PROTONIX Take 1 tablet (40 mg total) by mouth daily. What changed: when to take this   sitaGLIPtin 100 MG tablet Commonly known as: JANUVIA Take 100 mg by mouth every morning.   Victoza 18 MG/3ML Sopn Generic drug: liraglutide Inject into the skin.   WOMENS MULTIVITAMIN PO Take 1 tablet by mouth daily.               Discharge Care Instructions  (From admission, onward)           Start     Ordered   02/25/22 0000  Discharge wound care:       Comments: Continue wet to dry dressing at home of left BKA stump wound per vascular surgeon   02/25/22 1530            Vitals:   02/25/22 0752 02/25/22 1437  BP: 99/65 (!) 115/56  Pulse: 93   Resp: 18 18  Temp: 98 F (36.7 C)   SpO2: 99% 100%    Skin clean, dry and intact without evidence of skin break down, no evidence of skin tears noted. IV catheter discontinued intact. Site without signs and symptoms of complications. Dressing and pressure applied. Pt denies pain at this time. No complaints noted.  An After Visit Summary was printed and given to the patient. Patient escorted via WC, and D/C home via private auto.  Floyed Masoud C. Jilda Roche

## 2022-03-04 ENCOUNTER — Ambulatory Visit (INDEPENDENT_AMBULATORY_CARE_PROVIDER_SITE_OTHER): Payer: Medicare Other | Admitting: Nurse Practitioner

## 2022-03-15 ENCOUNTER — Ambulatory Visit (INDEPENDENT_AMBULATORY_CARE_PROVIDER_SITE_OTHER): Payer: Medicare Other | Admitting: Nurse Practitioner

## 2022-03-16 ENCOUNTER — Ambulatory Visit (INDEPENDENT_AMBULATORY_CARE_PROVIDER_SITE_OTHER): Payer: Medicare Other | Admitting: Nurse Practitioner

## 2022-03-26 ENCOUNTER — Ambulatory Visit (INDEPENDENT_AMBULATORY_CARE_PROVIDER_SITE_OTHER): Payer: Medicare Other | Admitting: Nurse Practitioner

## 2022-03-26 ENCOUNTER — Encounter (INDEPENDENT_AMBULATORY_CARE_PROVIDER_SITE_OTHER): Payer: Self-pay | Admitting: Nurse Practitioner

## 2022-03-26 VITALS — BP 141/88 | HR 92 | Resp 16

## 2022-03-26 DIAGNOSIS — Z89512 Acquired absence of left leg below knee: Secondary | ICD-10-CM

## 2022-03-26 MED ORDER — CLINDAMYCIN HCL 300 MG PO CAPS: 300.0000 mg | ORAL_CAPSULE | Freq: Three times a day (TID) | ORAL | 0 refills | Status: AC

## 2022-04-02 ENCOUNTER — Other Ambulatory Visit (INDEPENDENT_AMBULATORY_CARE_PROVIDER_SITE_OTHER): Payer: Self-pay | Admitting: Nurse Practitioner

## 2022-04-02 ENCOUNTER — Telehealth (INDEPENDENT_AMBULATORY_CARE_PROVIDER_SITE_OTHER): Payer: Self-pay

## 2022-04-02 MED ORDER — OXYCODONE HCL 10 MG PO TABS: 10.0000 mg | ORAL_TABLET | Freq: Three times a day (TID) | ORAL | 0 refills | Status: AC | PRN

## 2022-04-02 NOTE — Telephone Encounter (Signed)
I have sent the patient pain medication however I have reduced the previous dosage that she was getting.  While the patient does have a wound that is very small.  We should be able to begin decreasing and tapering her pain medication down.

## 2022-04-02 NOTE — Telephone Encounter (Signed)
Patient made aware with medical recommendations

## 2022-04-08 ENCOUNTER — Telehealth (INDEPENDENT_AMBULATORY_CARE_PROVIDER_SITE_OTHER): Payer: Self-pay

## 2022-04-09 ENCOUNTER — Ambulatory Visit (INDEPENDENT_AMBULATORY_CARE_PROVIDER_SITE_OTHER): Payer: Medicare Other | Admitting: Nurse Practitioner

## 2022-04-09 ENCOUNTER — Encounter (INDEPENDENT_AMBULATORY_CARE_PROVIDER_SITE_OTHER): Payer: Self-pay | Admitting: Nurse Practitioner

## 2022-04-09 VITALS — BP 149/80 | HR 103 | Resp 16

## 2022-04-09 DIAGNOSIS — Z89512 Acquired absence of left leg below knee: Secondary | ICD-10-CM

## 2022-04-13 NOTE — Progress Notes (Signed)
Subjective:    Patient ID: Sylvia [name], female    DOB: 1956-03-18, 66 y.o.   MRN: [id number] Chief Complaint  Patient presents with   Follow-up    2 week follow up    Sylvia [name] is a 66 year old female who returns today following a recent debridement of her left below-knee amputation site.  Today the site is very nearly healed however there is still some drainage from the wound itself.  The patient also notes that she is having difficulty with fully extending her leg with a full range of motion.    Review of Systems  Skin:  Positive for wound.  Neurological:  Positive for weakness.  All other systems reviewed and are negative.      Objective:   Physical Exam Vitals reviewed.  HENT:     Head: Normocephalic.  Cardiovascular:     Rate and Rhythm: Normal rate.  Pulmonary:     Effort: Pulmonary effort is normal.  Skin:    General: Skin is warm and dry.  Neurological:     Mental Status: She is alert and oriented to person, place, and time.     Motor: Weakness present.     Gait: Gait abnormal.  Psychiatric:        Mood and Affect: Mood normal.        Behavior: Behavior normal.        Thought Content: Thought content normal.        Judgment: Judgment normal.     BP (!) 141/88 (BP Location: Left Arm)   Pulse 92   Resp 16   Past Medical History:  Diagnosis Date   Anemia of chronic disease 05/16/2019   Anxiety    h/o   Arthritis    Cocaine abuse (HCC)    COPD (chronic obstructive pulmonary disease) (HCC)    Depression    Diabetes mellitus without complication (HCC)    DKA (diabetic ketoacidosis) (HCC)    Elevated troponin    GERD (gastroesophageal reflux disease)    GI bleed    Hypertension    bp under control-off meds since 2019   Ischemic leg     Social History   Socioeconomic History   Marital status: Divorced    Spouse name: Not on file   Number of children: 2   Years of education: Not on file   Highest education level: Not on file   Occupational History   Not on file  Tobacco Use   Smoking status: Former    Packs/day: 0.25    Years: 45.00    Total pack years: 11.25    Types: Cigarettes   Smokeless tobacco: Never   Tobacco comments:    quit  Vaping Use   Vaping Use: Never used  Substance and Sexual Activity   Alcohol use: No   Drug use: Not Currently    Types: Cocaine    Comment: last used in April 2021 per patient-last 2 drug tests have been negative for cocaine   Sexual activity: Not Currently  Other Topics Concern   Not on file  Social History Narrative   5 grandchildren and 5 great grandchildren    2 grandchildren live with Pt. (25 & 29 y.o.)   Social Determinants of Health   Financial Resource Strain: Low Risk  (06/14/2018)   Overall Financial Resource Strain (CARDIA)    Difficulty of Paying Living Expenses: Not hard at all  Food Insecurity: No Food Insecurity (02/23/2022)   Hunger Vital Sign  Worried About Programme researcher, broadcasting/film/video in the Last Year: Never true    Ran Out of Food in the Last Year: Never true  Transportation Needs: No Transportation Needs (02/23/2022)   PRAPARE - Administrator, Civil Service (Medical): No    Lack of Transportation (Non-Medical): No  Physical Activity: Insufficiently Active (06/14/2018)   Exercise Vital Sign    Days of Exercise per Week: 4 days    Minutes of Exercise per Session: 20 min  Stress: No Stress Concern Present (06/14/2018)   [employer] of Occupational Health - Occupational Stress Questionnaire    Feeling of Stress : Only a little  Social Connections: Unknown (06/14/2018)   Social Connection and Isolation Panel [NHANES]    Frequency of Communication with Friends and Family: Patient refused    Frequency of Social Gatherings with Friends and Family: Patient refused    Attends Religious Services: Patient refused    Active Member of Clubs or Organizations: Patient refused    Attends Banker Meetings: Patient refused    Marital  Status: Patient refused  Intimate Partner Violence: Not At Risk (02/23/2022)   Humiliation, Afraid, Rape, and Kick questionnaire    Fear of Current or Ex-Partner: No    Emotionally Abused: No    Physically Abused: No    Sexually Abused: No    Past Surgical History:  Procedure Laterality Date   ABDOMINAL HYSTERECTOMY     AMPUTATION Right 12/27/2018   Procedure: AMPUTATION BELOW KNEE;  Surgeon: Annice Needy, MD;  Location: [facility];  Service: General;  Laterality: Right;   AMPUTATION Left 11/05/2021   Procedure: AMPUTATION BELOW KNEE;  Surgeon: Annice Needy, MD;  Location: [facility];  Service: Vascular;  Laterality: Left;   APPLICATION OF WOUND VAC  10/16/2021   Procedure: APPLICATION OF WOUND VAC;  Surgeon: Annice Needy, MD;  Location: [facility];  Service: Vascular;;   CATARACT EXTRACTION W/PHACO Right 03/27/2020   Procedure: CATARACT EXTRACTION PHACO AND INTRAOCULAR LENS PLACEMENT (IOC) RIGHT DIABETIC 7.54 00:52.5;  Surgeon: Galen Manila, MD;  Location: [facility];  Service: Ophthalmology;  Laterality: Right;   CATARACT EXTRACTION W/PHACO Left 05/20/2020   Procedure: CATARACT EXTRACTION PHACO AND INTRAOCULAR LENS PLACEMENT (IOC) LEFT DIABETIC 4.95 00:37.6;  Surgeon: Galen Manila, MD;  Location: [facility];  Service: Ophthalmology;  Laterality: Left;  Diabetic - oral meds COVID + 04-24-20   CHOLECYSTECTOMY     COLONOSCOPY WITH PROPOFOL N/A 04/16/2019   Procedure: COLONOSCOPY WITH PROPOFOL;  Surgeon: Pasty Spillers, MD;  Location: [facility];  Service: Endoscopy;  Laterality: N/A;   COLONOSCOPY WITH PROPOFOL N/A 01/16/2020   Procedure: COLONOSCOPY WITH PROPOFOL;  Surgeon: Pasty Spillers, MD;  Location: [facility];  Service: Endoscopy;  Laterality: N/A;   ENDARTERECTOMY FEMORAL Left 10/16/2021   Procedure: ENDARTERECTOMY FEMORAL;  Surgeon: Annice Needy, MD;  Location: [facility];  Service: Vascular;  Laterality: Left;   ESOPHAGOGASTRODUODENOSCOPY (EGD) WITH  PROPOFOL N/A 06/15/2018   Procedure: ESOPHAGOGASTRODUODENOSCOPY (EGD) WITH PROPOFOL;  Surgeon: Toledo, Boykin Nearing, MD;  Location: [facility];  Service: Gastroenterology;  Laterality: N/A;   ESOPHAGOGASTRODUODENOSCOPY (EGD) WITH PROPOFOL N/A 01/03/2019   Procedure: ESOPHAGOGASTRODUODENOSCOPY (EGD) WITH PROPOFOL;  Surgeon: Midge Minium, MD;  Location: [facility];  Service: Endoscopy;  Laterality: N/A;   ESOPHAGOGASTRODUODENOSCOPY (EGD) WITH PROPOFOL N/A 09/25/2021   Procedure: ESOPHAGOGASTRODUODENOSCOPY (EGD) WITH PROPOFOL;  Surgeon: Midge Minium, MD;  Location: [facility];  Service: Endoscopy;  Laterality: N/A;   GIVENS CAPSULE STUDY  04/16/2019   Procedure: GIVENS CAPSULE STUDY;  Surgeon: Pasty Spillers, MD;  Location: [facility];  Service: Endoscopy;;   GIVENS CAPSULE STUDY N/A 06/25/2019   Procedure: GIVENS CAPSULE STUDY;  Surgeon: Wyline Mood, MD;  Location: [facility];  Service: Gastroenterology;  Laterality: N/A;   INCISION AND DRAINAGE OF WOUND Left 02/24/2022   Procedure: LEFT BKA IRRIGATION AND DEBRIDEMENT WOUND;  Surgeon: Annice Needy, MD;  Location: [facility];  Service: Vascular;  Laterality: Left;   LOWER EXTREMITY ANGIOGRAPHY Right 08/21/2018   Procedure: LOWER EXTREMITY ANGIOGRAPHY;  Surgeon: Annice Needy, MD;  Location: [facility];  Service: Cardiovascular;  Laterality: Right;   LOWER EXTREMITY ANGIOGRAPHY Left 08/28/2018   Procedure: LOWER EXTREMITY ANGIOGRAPHY;  Surgeon: Annice Needy, MD;  Location: [facility];  Service: Cardiovascular;  Laterality: Left;   LOWER EXTREMITY ANGIOGRAPHY Right 08/28/2018   Procedure: Lower Extremity Angiography;  Surgeon: Annice Needy, MD;  Location: [facility];  Service: Cardiovascular;  Laterality: Right;   LOWER EXTREMITY ANGIOGRAPHY Right 12/18/2018   Procedure: Lower Extremity Angiography;  Surgeon: Annice Needy, MD;  Location: [facility];  Service: Cardiovascular;  Laterality: Right;   LOWER  EXTREMITY ANGIOGRAPHY Right 12/18/2018   Procedure: Lower Extremity Angiography;  Surgeon: Annice Needy, MD;  Location: [facility];  Service: Cardiovascular;  Laterality: Right;   LOWER EXTREMITY ANGIOGRAPHY Left 12/21/2018   Procedure: Lower Extremity Angiography;  Surgeon: Annice Needy, MD;  Location: [facility];  Service: Cardiovascular;  Laterality: Left;   LOWER EXTREMITY ANGIOGRAPHY Right 12/21/2018   Procedure: Lower Extremity Angiography;  Surgeon: Annice Needy, MD;  Location: [facility];  Service: Cardiovascular;  Laterality: Right;   LOWER EXTREMITY ANGIOGRAPHY Left 12/25/2020   Procedure: LOWER EXTREMITY ANGIOGRAPHY;  Surgeon: Annice Needy, MD;  Location: [facility];  Service: Cardiovascular;  Laterality: Left;   LOWER EXTREMITY ANGIOGRAPHY Left 09/21/2021   Procedure: Lower Extremity Angiography;  Surgeon: Annice Needy, MD;  Location: [facility];  Service: Cardiovascular;  Laterality: Left;   LOWER EXTREMITY ANGIOGRAPHY Left 10/13/2021   Procedure: Lower Extremity Angiography;  Surgeon: Renford Dills, MD;  Location: [facility];  Service: Cardiovascular;  Laterality: Left;   LOWER EXTREMITY ANGIOGRAPHY Left 10/14/2021   Procedure: Lower Extremity Angiography;  Surgeon: Annice Needy, MD;  Location: [facility];  Service: Cardiovascular;  Laterality: Left;   LOWER EXTREMITY ANGIOGRAPHY Left 11/02/2021   Procedure: Lower Extremity Angiography;  Surgeon: Annice Needy, MD;  Location: [facility];  Service: Cardiovascular;  Laterality: Left;   LOWER EXTREMITY INTERVENTION N/A 12/22/2018   Procedure: LOWER EXTREMITY INTERVENTION;  Surgeon: Annice Needy, MD;  Location: [facility];  Service: Cardiovascular;  Laterality: N/A;   LOWER EXTREMITY INTERVENTION Left 12/26/2020   Procedure: LOWER EXTREMITY INTERVENTION;  Surgeon: Annice Needy, MD;  Location: [facility];  Service: Cardiovascular;  Laterality: Left;    LOWER EXTREMITY INTERVENTION N/A 09/22/2021   Procedure: LOWER EXTREMITY INTERVENTION;  Surgeon: Annice Needy, MD;  Location: [facility];  Service: Cardiovascular;  Laterality: N/A;    Family History  Problem Relation Age of Onset   Breast cancer Mother 4    Allergies  Allergen Reactions   Vancomycin Rash    Patient developed a rash to injection site and arm a few mintes after starting ABX.    Hydrocodone Rash  Metformin And Related Diarrhea   Penicillins Hives    Has patient had a PCN reaction causing immediate rash, facial/tongue/throat swelling, SOB or lightheadedness with hypotension: Yes Has patient had a PCN reaction causing severe rash involving mucus membranes or skin necrosis: No Has patient had a PCN reaction that required hospitalization: No Has patient had a PCN reaction occurring within the last 10 years: No If all of the above answers are "NO", then may proceed with Cephalosporin use.   Tramadol Itching       Latest Ref Rng & Units 02/25/2022    4:18 AM 02/23/2022    3:45 AM 02/22/2022    7:57 PM  CBC  WBC 4.0 - 10.5 K/uL 8.7  10.3  8.7   Hemoglobin 12.0 - 15.0 g/dL 9.7  78.2  95.6   Hematocrit 36.0 - 46.0 % 28.1  30.5  33.2   Platelets 150 - 400 K/uL 415  521  547       CMP     Component Value Date/Time   NA 137 02/25/2022 0418   K 4.2 02/25/2022 0418   CL 107 02/25/2022 0418   CO2 21 (L) 02/25/2022 0418   GLUCOSE 211 (H) 02/25/2022 0418   BUN 24 (H) 02/25/2022 0418   CREATININE 1.17 (H) 02/25/2022 0418   CALCIUM 9.8 02/25/2022 0418   PROT 8.4 (H) 11/30/2021 1050   ALBUMIN 3.5 11/30/2021 1050   AST 14 (L) 11/30/2021 1050   ALT 11 11/30/2021 1050   ALKPHOS 123 11/30/2021 1050   BILITOT 0.3 11/30/2021 1050   GFRNONAA 51 (L) 02/25/2022 0418   GFRAA >60 11/23/2019 1505     No results found.     Assessment & Plan:   1. Hx of BKA, left (HCC) Today the patient's wound is very nearly healed but there is still some concerning drainage  therefore we will send the patient in some antibiotics.  The larger concern is the fact that the patient's knee is currently contracted.  She still has movement but she does not have the full range of motion which would be necessary for prosthetic.  I discussed this with the patient at this time she does not wish to move forward with further revision of her amputation site.  Will have the patient return in 2 weeks to ensure that the wound is closed as well as to discuss possible options with her revision.   Current Outpatient Medications on File Prior to Visit  Medication Sig Dispense Refill   acetaminophen (TYLENOL) 325 MG tablet Take 650 mg by mouth every 6 (six) hours as needed.     albuterol (PROVENTIL HFA;VENTOLIN HFA) 108 (90 Base) MCG/ACT inhaler Inhale 2 puffs into the lungs every 6 (six) hours as needed for wheezing or shortness of breath. 1 Inhaler 2   apixaban (ELIQUIS) 5 MG TABS tablet Take 1 tablet (5 mg total) by mouth 2 (two) times daily. 60 tablet 12   atorvastatin (LIPITOR) 10 MG tablet Take 10 mg by mouth at bedtime.     cetirizine (ZYRTEC) 10 MG tablet Take 10 mg by mouth daily as needed for allergies.     collagenase (SANTYL) 250 UNIT/GM ointment Apply 1 Application topically daily. 30 g 3   docusate sodium (COLACE) 100 MG capsule Take 100 mg by mouth daily.     DULoxetine (CYMBALTA) 60 MG capsule Take 1 capsule (60 mg total) by mouth daily. 30 capsule 3   ferrous sulfate 325 (65 FE) MG tablet Take 325 mg by  mouth daily.      folic acid (FOLVITE) 1 MG tablet Take 1 mg by mouth daily.     gabapentin (NEURONTIN) 300 MG capsule TAKE 3 CAPSULES BY MOUTH IN THE  MORNING AND 3 CAPSULES BY MOUTH  IN THE EVENING 450 capsule 3   hydroxychloroquine (PLAQUENIL) 200 MG tablet Take 200 mg by mouth daily.     JARDIANCE 10 MG TABS tablet Take 10 mg by mouth every morning.     lisinopril (ZESTRIL) 10 MG tablet Take 1 tablet (10 mg total) by mouth daily. (Patient taking differently: Take 10 mg  by mouth every morning.) 30 tablet 1   methotrexate (RHEUMATREX) 2.5 MG tablet Take 20 mg by mouth once a week. Has not yet started as of 02/23/22     Multiple Vitamins-Minerals (WOMENS MULTIVITAMIN PO) Take 1 tablet by mouth daily.     ondansetron (ZOFRAN-ODT) 4 MG disintegrating tablet Take 1 tablet (4 mg total) by mouth every 8 (eight) hours as needed for nausea or vomiting. 20 tablet 0   ondansetron (ZOFRAN-ODT) 4 MG disintegrating tablet Take 1 tablet (4 mg total) by mouth every 6 (six) hours as needed for nausea or vomiting. 20 tablet 0   pantoprazole (PROTONIX) 40 MG tablet Take 1 tablet (40 mg total) by mouth daily. (Patient taking differently: Take 40 mg by mouth every morning.) 30 tablet 11   sitaGLIPtin (JANUVIA) 100 MG tablet Take 100 mg by mouth every morning.     VICTOZA 18 MG/3ML SOPN Inject into the skin.     vitamin B-12 (CYANOCOBALAMIN) 500 MCG tablet Take 1 tablet (500 mcg total) by mouth daily. 90 tablet 1   No current facility-administered medications on file prior to visit.    There are no Patient Instructions on file for this visit. No follow-ups on file.   Georgiana Spinner, NP

## 2022-04-14 ENCOUNTER — Telehealth (INDEPENDENT_AMBULATORY_CARE_PROVIDER_SITE_OTHER): Payer: Self-pay

## 2022-04-14 NOTE — Telephone Encounter (Signed)
Nancy home health nurse from [facility] left a message requesting for verbal orders to discontinue home health. Patient wound has healed completely. Verbal orders are fine per Sheppard Plumber NP.

## 2022-04-21 ENCOUNTER — Encounter (INDEPENDENT_AMBULATORY_CARE_PROVIDER_SITE_OTHER): Payer: Self-pay | Admitting: Nurse Practitioner

## 2022-04-21 NOTE — Progress Notes (Signed)
Subjective:    Patient ID: Sylvia [name], female    DOB: [birth year], 67 y.o.   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  There is no oozing or drainage.  The patient still has a contracture present however but she is not yet ready to move forward with a revision.  She does note some pain and discomfort within the knee area itself resulting from the contracture.    Review of Systems  Neurological:  Positive for weakness.  All other systems reviewed and are negative.      Objective:   Physical Exam Vitals reviewed.  Cardiovascular:     Rate and Rhythm: Normal rate.  Pulmonary:     Effort: Pulmonary effort is normal.  Musculoskeletal:     Right Lower Extremity: Right leg is amputated below knee.     Left Lower Extremity: Left leg is amputated below knee.  Skin:    General: Skin is warm and dry.  Neurological:     Mental Status: She is alert and oriented to person, place, and time.     Motor: Weakness present.     Gait: Gait abnormal.  Psychiatric:        Mood and Affect: Mood normal.        Behavior: Behavior normal.        Thought Content: Thought content normal.        Judgment: Judgment normal.     BP (!) 149/80 (BP Location: Right Arm)   Pulse (!) 103   Resp 16   Past Medical History:  Diagnosis Date   Anemia of chronic disease 05/16/2019   Anxiety    h/o   Arthritis    Cocaine abuse (HCC)    COPD (chronic obstructive pulmonary disease) (HCC)    Depression    Diabetes mellitus without complication (HCC)    DKA (diabetic ketoacidosis) (HCC)    Elevated troponin    GERD (gastroesophageal reflux disease)    GI bleed    Hypertension    bp under control-off meds since 2019   Ischemic leg     Social History   Socioeconomic History   Marital status: Divorced    Spouse name: Not on file   Number of children: 2   Years of education: Not  on file   Highest education level: Not on file  Occupational History   Not on file  Tobacco Use   Smoking status: Former    Packs/day: 0.25    Years: 45.00    Total pack years: 11.25    Types: Cigarettes   Smokeless tobacco: Never   Tobacco comments:    quit  Vaping Use   Vaping Use: Never used  Substance and Sexual Activity   Alcohol use: No   Drug use: Not Currently    Types: Cocaine    Comment: last used in April 2021 per patient-last 2 drug tests have been negative for cocaine   Sexual activity: Not Currently  Other Topics Concern   Not on file  Social History Narrative   5 grandchildren and 5 great grandchildren    2 grandchildren live with Pt. (73 & 60 y.o.)   Social Determinants of Health   Financial Resource Strain: Low Risk  (06/14/2018)   Overall Financial Resource Strain (CARDIA)    Difficulty of Paying Living Expenses: Not hard at all  Food Insecurity:  No Food Insecurity (02/23/2022)   Hunger Vital Sign    Worried About Running Out of Food in the Last Year: Never true    Ran Out of Food in the Last Year: Never true  Transportation Needs: No Transportation Needs (02/23/2022)   PRAPARE - Hydrologist (Medical): No    Lack of Transportation (Non-Medical): No  Physical Activity: Insufficiently Active (06/14/2018)   Exercise Vital Sign    Days of Exercise per Week: 4 days    Minutes of Exercise per Session: 20 min  Stress: No Stress Concern Present (06/14/2018)   [place]    Feeling of Stress : Only a little  Social Connections: Unknown (06/14/2018)   Social Connection and Isolation Panel [NHANES]    Frequency of Communication with Friends and Family: Patient refused    Frequency of Social Gatherings with Friends and Family: Patient refused    Attends Religious Services: Patient refused    Active Member of Clubs or Organizations: Patient refused    Attends Theatre manager Meetings: Patient refused    Marital Status: Patient refused  Intimate Partner Violence: Not At Risk (02/23/2022)   Humiliation, Afraid, Rape, and Kick questionnaire    Fear of Current or Ex-Partner: No    Emotionally Abused: No    Physically Abused: No    Sexually Abused: No    Past Surgical History:  Procedure Laterality Date   ABDOMINAL HYSTERECTOMY     AMPUTATION Right 12/27/2018   Procedure: AMPUTATION BELOW KNEE;  Surgeon: Algernon Huxley, MD;  Location: [facility];  Service: General;  Laterality: Right;   AMPUTATION Left 11/05/2021   Procedure: AMPUTATION BELOW KNEE;  Surgeon: Algernon Huxley, MD;  Location: [facility];  Service: Vascular;  Laterality: Left;   APPLICATION OF WOUND VAC  10/16/2021   Procedure: APPLICATION OF WOUND VAC;  Surgeon: Algernon Huxley, MD;  Location: [facility];  Service: Vascular;;   CATARACT EXTRACTION W/PHACO Right 03/27/2020   Procedure: CATARACT EXTRACTION PHACO AND INTRAOCULAR LENS PLACEMENT ([place]) RIGHT DIABETIC 7.54 00:52.5;  Surgeon: Birder Robson, MD;  Location: [place];  Service: Ophthalmology;  Laterality: Right;   CATARACT EXTRACTION W/PHACO Left 05/20/2020   Procedure: CATARACT EXTRACTION PHACO AND INTRAOCULAR LENS PLACEMENT ([facility]) LEFT DIABETIC 4.95 00:37.6;  Surgeon: Birder Robson, MD;  Location: [place];  Service: Ophthalmology;  Laterality: Left;  Diabetic - oral meds COVID + 04-24-20   CHOLECYSTECTOMY     COLONOSCOPY WITH PROPOFOL N/A 04/16/2019   Procedure: COLONOSCOPY WITH PROPOFOL;  Surgeon: Virgel Manifold, MD;  Location: [facility];  Service: Endoscopy;  Laterality: N/A;   COLONOSCOPY WITH PROPOFOL N/A 01/16/2020   Procedure: COLONOSCOPY WITH PROPOFOL;  Surgeon: Virgel Manifold, MD;  Location: [facility];  Service: Endoscopy;  Laterality: N/A;   ENDARTERECTOMY FEMORAL Left 10/16/2021   Procedure: ENDARTERECTOMY FEMORAL;  Surgeon: Algernon Huxley, MD;  Location: [facility];  Service: Vascular;   Laterality: Left;   ESOPHAGOGASTRODUODENOSCOPY (EGD) WITH PROPOFOL N/A 06/15/2018   Procedure: ESOPHAGOGASTRODUODENOSCOPY (EGD) WITH PROPOFOL;  Surgeon: [place], Benay Pike, MD;  Location: [facility];  Service: Gastroenterology;  Laterality: N/A;   ESOPHAGOGASTRODUODENOSCOPY (EGD) WITH PROPOFOL N/A 01/03/2019   Procedure: ESOPHAGOGASTRODUODENOSCOPY (EGD) WITH PROPOFOL;  Surgeon: Lucilla Lame, MD;  Location: [facility];  Service: Endoscopy;  Laterality: N/A;   ESOPHAGOGASTRODUODENOSCOPY (EGD) WITH PROPOFOL N/A 09/25/2021   Procedure: ESOPHAGOGASTRODUODENOSCOPY (EGD) WITH PROPOFOL;  Surgeon: Lucilla Lame, MD;  Location: [facility];  Service: Endoscopy;  Laterality: N/A;   GIVENS CAPSULE STUDY  04/16/2019   Procedure: GIVENS CAPSULE STUDY;  Surgeon: Pasty Spillers, MD;  Location: [facility];  Service: Endoscopy;;   GIVENS CAPSULE STUDY N/A 06/25/2019   Procedure: GIVENS CAPSULE STUDY;  Surgeon: Wyline Mood, MD;  [facility];  Service: Gastroenterology;  Laterality: N/A;   INCISION AND DRAINAGE OF WOUND Left 02/24/2022   Procedure: LEFT BKA IRRIGATION AND DEBRIDEMENT WOUND;  Surgeon: Annice Needy, MD;  Location: [facility];  Service: Vascular;  Laterality: Left;   LOWER EXTREMITY ANGIOGRAPHY Right 08/21/2018   Procedure: LOWER EXTREMITY ANGIOGRAPHY;  Surgeon: Annice Needy, MD;  Location: [facility];  Service: Cardiovascular;  Laterality: Right;   LOWER EXTREMITY ANGIOGRAPHY Left 08/28/2018   Procedure: LOWER EXTREMITY ANGIOGRAPHY;  Surgeon: Annice Needy, MD;  Location: [facility];  Service: Cardiovascular;  Laterality: Left;   LOWER EXTREMITY ANGIOGRAPHY Right 08/28/2018   Procedure: Lower Extremity Angiography;  Surgeon: Annice Needy, MD;  Location: [facility];  Service: Cardiovascular;  Laterality: Right;   LOWER EXTREMITY ANGIOGRAPHY Right 12/18/2018   Procedure: Lower Extremity Angiography;  Surgeon: Annice Needy, MD;  Location: [facility];  Service: Cardiovascular;  Laterality: Right;   LOWER EXTREMITY ANGIOGRAPHY Right 12/18/2018   Procedure: Lower Extremity Angiography;  Surgeon: Annice Needy, MD;  Location: [facility];  Service: Cardiovascular;  Laterality: Right;   LOWER EXTREMITY ANGIOGRAPHY Left 12/21/2018   Procedure: Lower Extremity Angiography;  Surgeon: Annice Needy, MD;  Location: [facility];  Service: Cardiovascular;  Laterality: Left;   LOWER EXTREMITY ANGIOGRAPHY Right 12/21/2018   Procedure: Lower Extremity Angiography;  Surgeon: Annice Needy, MD;  Location: [facility];  Service: Cardiovascular;  Laterality: Right;   LOWER EXTREMITY ANGIOGRAPHY Left 12/25/2020   Procedure: LOWER EXTREMITY ANGIOGRAPHY;  Surgeon: Annice Needy, MD;  Location: [facility];  Service: Cardiovascular;  Laterality: Left;   LOWER EXTREMITY ANGIOGRAPHY Left 09/21/2021   Procedure: Lower Extremity Angiography;  Surgeon: Annice Needy, MD;  Location: [facility];  Service: Cardiovascular;  Laterality: Left;   LOWER EXTREMITY ANGIOGRAPHY Left 10/13/2021   Procedure: Lower Extremity Angiography;  Surgeon: Renford Dills, MD;  Location: [facility];  Service: Cardiovascular;  Laterality: Left;   LOWER EXTREMITY ANGIOGRAPHY Left 10/14/2021   Procedure: Lower Extremity Angiography;  Surgeon: Annice Needy, MD;  Location: [facility];  Service: Cardiovascular;  Laterality: Left;   LOWER EXTREMITY ANGIOGRAPHY Left 11/02/2021   Procedure: Lower Extremity Angiography;  Surgeon: Annice Needy, MD;  Location: [facility];  Service: Cardiovascular;  Laterality: Left;   LOWER EXTREMITY INTERVENTION N/A 12/22/2018   Procedure: LOWER EXTREMITY INTERVENTION;  Surgeon: Annice Needy, MD;  Location: [facility];  Service: Cardiovascular;  Laterality: N/A;   LOWER EXTREMITY INTERVENTION Left 12/26/2020   Procedure: LOWER EXTREMITY INTERVENTION;  Surgeon: Annice Needy, MD;  Location: [facility];  Service: Cardiovascular;  Laterality: Left;   LOWER EXTREMITY INTERVENTION N/A 09/22/2021   Procedure: LOWER EXTREMITY INTERVENTION;  Surgeon: Annice Needy, MD;  Location: [facility];  Service: Cardiovascular;  Laterality: N/A;    Family History  Problem Relation Age of Onset   Breast cancer Mother 35    Allergies  Allergen Reactions   Vancomycin Rash    Patient developed a rash to injection site and arm  a few mintes after starting ABX.    Hydrocodone Rash   Metformin And Related Diarrhea   Penicillins Hives    Has patient had a PCN reaction causing immediate rash, facial/tongue/throat swelling, SOB or lightheadedness with hypotension: Yes Has patient had a PCN reaction causing severe rash involving mucus membranes or skin necrosis: No Has patient had a PCN reaction that required hospitalization: No Has patient had a PCN reaction occurring within the last 10 years: No If all of the above answers are "NO", then may proceed with Cephalosporin use.   Tramadol Itching       Latest Ref Rng & Units 02/25/2022    4:18 AM 02/23/2022    3:45 AM 02/22/2022    7:57 PM  CBC  WBC 4.0 - 10.5 K/uL 8.7  10.3  8.7   Hemoglobin 12.0 - 15.0 g/dL 9.7  20.2  54.2   Hematocrit 36.0 - 46.0 % 28.1  30.5  33.2   Platelets 150 - 400 K/uL 415  521  547       CMP     Component Value Date/Time   NA 137 02/25/2022 0418   K 4.2 02/25/2022 0418   CL 107 02/25/2022 0418   CO2 21 (L) 02/25/2022 0418   GLUCOSE 211 (H) 02/25/2022 0418   BUN 24 (H) 02/25/2022 0418   CREATININE 1.17 (H) 02/25/2022 0418   CALCIUM 9.8 02/25/2022 0418   PROT 8.4 (H) 11/30/2021 1050   ALBUMIN 3.5 11/30/2021 1050   AST 14 (L) 11/30/2021 1050   ALT 11 11/30/2021 1050   ALKPHOS 123 11/30/2021 1050   BILITOT 0.3 11/30/2021 1050   GFRNONAA 51 (L) 02/25/2022 0418   GFRAA >60 11/23/2019 1505     No results found.     Assessment & Plan:   1. Hx of BKA, left (HCC) No further wound care  treatment recommended.  We had a long discussion regarding contracture once again.  The patient currently is not ready to move forward with any revision.  She is currently working with physical therapy.  Have encouraged her to work with them to see if they are able to improve the degree of stretch with the knee.  Will have the patient return in 1 month to broach the topic of discussion about revision.   Current Outpatient Medications on File Prior to Visit  Medication Sig Dispense Refill   acetaminophen (TYLENOL) 325 MG tablet Take 650 mg by mouth every 6 (six) hours as needed.     albuterol (PROVENTIL HFA;VENTOLIN HFA) 108 (90 Base) MCG/ACT inhaler Inhale 2 puffs into the lungs every 6 (six) hours as needed for wheezing or shortness of breath. 1 Inhaler 2   apixaban (ELIQUIS) 5 MG TABS tablet Take 1 tablet (5 mg total) by mouth 2 (two) times daily. 60 tablet 12   atorvastatin (LIPITOR) 10 MG tablet Take 10 mg by mouth at bedtime.     cetirizine (ZYRTEC) 10 MG tablet Take 10 mg by mouth daily as needed for allergies.     collagenase (SANTYL) 250 UNIT/GM ointment Apply 1 Application topically daily. 30 g 3   docusate sodium (COLACE) 100 MG capsule Take 100 mg by mouth daily.     DULoxetine (CYMBALTA) 60 MG capsule Take 1 capsule (60 mg total) by mouth daily. 30 capsule 3   ferrous sulfate 325 (65 FE) MG tablet Take 325 mg by mouth daily.      folic acid (FOLVITE) 1 MG tablet Take 1 mg by mouth daily.  gabapentin (NEURONTIN) 300 MG capsule TAKE 3 CAPSULES BY MOUTH IN THE  MORNING AND 3 CAPSULES BY MOUTH  IN THE EVENING 450 capsule 3   hydroxychloroquine (PLAQUENIL) 200 MG tablet Take 200 mg by mouth daily.     JARDIANCE 10 MG TABS tablet Take 10 mg by mouth every morning.     lisinopril (ZESTRIL) 10 MG tablet Take 1 tablet (10 mg total) by mouth daily. (Patient taking differently: Take 10 mg by mouth every morning.) 30 tablet 1   methotrexate (RHEUMATREX) 2.5 MG tablet Take 20 mg by mouth once a  week. Has not yet started as of 02/23/22     Multiple Vitamins-Minerals (WOMENS MULTIVITAMIN PO) Take 1 tablet by mouth daily.     ondansetron (ZOFRAN-ODT) 4 MG disintegrating tablet Take 1 tablet (4 mg total) by mouth every 8 (eight) hours as needed for nausea or vomiting. 20 tablet 0   ondansetron (ZOFRAN-ODT) 4 MG disintegrating tablet Take 1 tablet (4 mg total) by mouth every 6 (six) hours as needed for nausea or vomiting. 20 tablet 0   pantoprazole (PROTONIX) 40 MG tablet Take 1 tablet (40 mg total) by mouth daily. (Patient taking differently: Take 40 mg by mouth every morning.) 30 tablet 11   sitaGLIPtin (JANUVIA) 100 MG tablet Take 100 mg by mouth every morning.     VICTOZA 18 MG/3ML SOPN Inject into the skin.     vitamin B-12 (CYANOCOBALAMIN) 500 MCG tablet Take 1 tablet (500 mcg total) by mouth daily. 90 tablet 1   No current facility-administered medications on file prior to visit.    There are no Patient Instructions on file for this visit. No follow-ups on file.   Georgiana Spinner, NP

## 2022-05-04 ENCOUNTER — Encounter: Payer: Self-pay | Admitting: Oncology

## 2022-05-05 ENCOUNTER — Encounter (INDEPENDENT_AMBULATORY_CARE_PROVIDER_SITE_OTHER): Payer: Self-pay | Admitting: Nurse Practitioner

## 2022-05-11 ENCOUNTER — Encounter (INDEPENDENT_AMBULATORY_CARE_PROVIDER_SITE_OTHER): Payer: Self-pay | Admitting: Vascular Surgery

## 2022-05-11 ENCOUNTER — Ambulatory Visit (INDEPENDENT_AMBULATORY_CARE_PROVIDER_SITE_OTHER): Payer: 59 | Admitting: Vascular Surgery

## 2022-05-11 VITALS — BP 140/75 | HR 98 | Resp 18 | Ht 67.0 in | Wt 130.0 lb

## 2022-05-11 DIAGNOSIS — I70229 Atherosclerosis of native arteries of extremities with rest pain, unspecified extremity: Secondary | ICD-10-CM | POA: Diagnosis not present

## 2022-05-11 DIAGNOSIS — E785 Hyperlipidemia, unspecified: Secondary | ICD-10-CM

## 2022-05-11 DIAGNOSIS — I10 Essential (primary) hypertension: Secondary | ICD-10-CM

## 2022-05-11 DIAGNOSIS — E1142 Type 2 diabetes mellitus with diabetic polyneuropathy: Secondary | ICD-10-CM

## 2022-05-11 NOTE — Assessment & Plan Note (Signed)
The patient has a contracture of the left BKA.  I discussed with her that I think this will ultimately require revision to an amputation but she is adamantly opposed to that at this point nonconversant.  I have given her some pain medications today but ultimately I do not think this will likely be a situation which will be tolerable for her.  Will plan to see her back in a few months.

## 2022-05-11 NOTE — Progress Notes (Signed)
MRN : [id number]  Sylvia [name] is a 67 y.o. (07-11-1955) female who presents with chief complaint of  Chief Complaint  Patient presents with   Follow-up    1 month no studies  .  History of Present Illness: Patient returns today in follow up of her left below-knee amputation site.  She continues to have contracture there despite working with physical therapy.  She has severe pain from this.  She is adamantly opposed to any surgical revision at this point.  She says this is bothered her so much she has even started smoking again.  Current Outpatient Medications  Medication Sig Dispense Refill   acetaminophen (TYLENOL) 325 MG tablet Take 650 mg by mouth every 6 (six) hours as needed.     albuterol (PROVENTIL HFA;VENTOLIN HFA) 108 (90 Base) MCG/ACT inhaler Inhale 2 puffs into the lungs every 6 (six) hours as needed for wheezing or shortness of breath. 1 Inhaler 2   apixaban (ELIQUIS) 5 MG TABS tablet Take 1 tablet (5 mg total) by mouth 2 (two) times daily. 60 tablet 12   atorvastatin (LIPITOR) 10 MG tablet Take 10 mg by mouth at bedtime.     cetirizine (ZYRTEC) 10 MG tablet Take 10 mg by mouth daily as needed for allergies.     collagenase (SANTYL) 250 UNIT/GM ointment Apply 1 Application topically daily. 30 g 3   docusate sodium (COLACE) 100 MG capsule Take 100 mg by mouth daily.     DULoxetine (CYMBALTA) 60 MG capsule Take 1 capsule (60 mg total) by mouth daily. 30 capsule 3   ferrous sulfate 325 (65 FE) MG tablet Take 325 mg by mouth daily.      folic acid (FOLVITE) 1 MG tablet Take 1 mg by mouth daily.     gabapentin (NEURONTIN) 300 MG capsule TAKE 3 CAPSULES BY MOUTH IN THE  MORNING AND 3 CAPSULES BY MOUTH  IN THE EVENING 450 capsule 3   hydroxychloroquine (PLAQUENIL) 200 MG tablet Take 200 mg by mouth daily.     JARDIANCE 10 MG TABS tablet Take 10 mg by mouth every morning.     lisinopril (ZESTRIL) 10 MG tablet Take 1 tablet (10 mg total) by mouth daily. (Patient taking  differently: Take 10 mg by mouth every morning.) 30 tablet 1   methotrexate (RHEUMATREX) 2.5 MG tablet Take 20 mg by mouth once a week. Has not yet started as of 02/23/22     Multiple Vitamins-Minerals (WOMENS MULTIVITAMIN PO) Take 1 tablet by mouth daily.     ondansetron (ZOFRAN-ODT) 4 MG disintegrating tablet Take 1 tablet (4 mg total) by mouth every 8 (eight) hours as needed for nausea or vomiting. 20 tablet 0   ondansetron (ZOFRAN-ODT) 4 MG disintegrating tablet Take 1 tablet (4 mg total) by mouth every 6 (six) hours as needed for nausea or vomiting. 20 tablet 0   pantoprazole (PROTONIX) 40 MG tablet Take 1 tablet (40 mg total) by mouth daily. (Patient taking differently: Take 40 mg by mouth every morning.) 30 tablet 11   sitaGLIPtin (JANUVIA) 100 MG tablet Take 100 mg by mouth every morning.     VICTOZA 18 MG/3ML SOPN Inject into the skin.     vitamin B-12 (CYANOCOBALAMIN) 500 MCG tablet Take 1 tablet (500 mcg total) by mouth daily. 90 tablet 1   No current facility-administered medications for this visit.    Past Medical History:  Diagnosis Date   Anemia of chronic disease 05/16/2019   Anxiety    h/o  Arthritis    Cocaine abuse (HCC)    COPD (chronic obstructive pulmonary disease) (HCC)    Depression    Diabetes mellitus without complication (HCC)    DKA (diabetic ketoacidosis) (HCC)    Elevated troponin    GERD (gastroesophageal reflux disease)    GI bleed    Hypertension    bp under control-off meds since 2019   Ischemic leg     Past Surgical History:  Procedure Laterality Date   ABDOMINAL HYSTERECTOMY     AMPUTATION Right 12/27/2018   Procedure: AMPUTATION BELOW KNEE;  Surgeon: Annice Needy, MD;  Location: [facility];  Service: General;  Laterality: Right;   AMPUTATION Left 11/05/2021   Procedure: AMPUTATION BELOW KNEE;  Surgeon: Annice Needy, MD;  Location: [facility];  Service: Vascular;  Laterality: Left;   APPLICATION OF WOUND VAC  10/16/2021   Procedure: APPLICATION OF  WOUND VAC;  Surgeon: Annice Needy, MD;  Location: [facility];  Service: Vascular;;   CATARACT EXTRACTION W/PHACO Right 03/27/2020   Procedure: CATARACT EXTRACTION PHACO AND INTRAOCULAR LENS PLACEMENT (IOC) RIGHT DIABETIC 7.54 00:52.5;  Surgeon: Galen Manila, MD;  Location: [facility];  Service: Ophthalmology;  Laterality: Right;   CATARACT EXTRACTION W/PHACO Left 05/20/2020   Procedure: CATARACT EXTRACTION PHACO AND INTRAOCULAR LENS PLACEMENT (IOC) LEFT DIABETIC 4.95 00:37.6;  Surgeon: Galen Manila, MD;  Location: [facility];  Service: Ophthalmology;  Laterality: Left;  Diabetic - oral meds COVID + 04-24-20   CHOLECYSTECTOMY     COLONOSCOPY WITH PROPOFOL N/A 04/16/2019   Procedure: COLONOSCOPY WITH PROPOFOL;  Surgeon: Pasty Spillers, MD;  Location: [facility];  Service: Endoscopy;  Laterality: N/A;   COLONOSCOPY WITH PROPOFOL N/A 01/16/2020   Procedure: COLONOSCOPY WITH PROPOFOL;  Surgeon: Pasty Spillers, MD;  Location: [facility];  Service: Endoscopy;  Laterality: N/A;   ENDARTERECTOMY FEMORAL Left 10/16/2021   Procedure: ENDARTERECTOMY FEMORAL;  Surgeon: Annice Needy, MD;  Location: [facility];  Service: Vascular;  Laterality: Left;   ESOPHAGOGASTRODUODENOSCOPY (EGD) WITH PROPOFOL N/A 06/15/2018   Procedure: ESOPHAGOGASTRODUODENOSCOPY (EGD) WITH PROPOFOL;  Surgeon: Toledo, Boykin Nearing, MD;  Location: [facility];  Service: Gastroenterology;  Laterality: N/A;   ESOPHAGOGASTRODUODENOSCOPY (EGD) WITH PROPOFOL N/A 01/03/2019   Procedure: ESOPHAGOGASTRODUODENOSCOPY (EGD) WITH PROPOFOL;  Surgeon: Midge Minium, MD;  Location: [facility];  Service: Endoscopy;  Laterality: N/A;   ESOPHAGOGASTRODUODENOSCOPY (EGD) WITH PROPOFOL N/A 09/25/2021   Procedure: ESOPHAGOGASTRODUODENOSCOPY (EGD) WITH PROPOFOL;  Surgeon: Midge Minium, MD;  Location: [facility];  Service: Endoscopy;  Laterality: N/A;   GIVENS CAPSULE STUDY  04/16/2019   Procedure: GIVENS CAPSULE STUDY;   Surgeon: Pasty Spillers, MD;  Location: [facility];  Service: Endoscopy;;   GIVENS CAPSULE STUDY N/A 06/25/2019   Procedure: GIVENS CAPSULE STUDY;  Surgeon: Wyline Mood, MD;  Location: [facility];  Service: Gastroenterology;  Laterality: N/A;   INCISION AND DRAINAGE OF WOUND Left 02/24/2022   Procedure: LEFT BKA IRRIGATION AND DEBRIDEMENT WOUND;  Surgeon: Annice Needy, MD;  Location: [facility];  Service: Vascular;  Laterality: Left;   LOWER EXTREMITY ANGIOGRAPHY Right 08/21/2018   Procedure: LOWER EXTREMITY ANGIOGRAPHY;  Surgeon: Annice Needy, MD;  Location: [facility];  Service: Cardiovascular;  Laterality: Right;   LOWER EXTREMITY ANGIOGRAPHY Left 08/28/2018   Procedure: LOWER EXTREMITY ANGIOGRAPHY;  Surgeon: Annice Needy, MD;  Location: [facility];  Service: Cardiovascular;  Laterality: Left;   LOWER EXTREMITY ANGIOGRAPHY Right 08/28/2018   Procedure: Lower Extremity Angiography;  Surgeon:  Annice Needy, MD;  Location: [facility];  Service: Cardiovascular;  Laterality: Right;   LOWER EXTREMITY ANGIOGRAPHY Right 12/18/2018   Procedure: Lower Extremity Angiography;  Surgeon: Annice Needy, MD;  Location: [facility];  Service: Cardiovascular;  Laterality: Right;   LOWER EXTREMITY ANGIOGRAPHY Right 12/18/2018   Procedure: Lower Extremity Angiography;  Surgeon: Annice Needy, MD;  Location: [facility];  Service: Cardiovascular;  Laterality: Right;   LOWER EXTREMITY ANGIOGRAPHY Left 12/21/2018   Procedure: Lower Extremity Angiography;  Surgeon: Annice Needy, MD;  Location: [facility];  Service: Cardiovascular;  Laterality: Left;   LOWER EXTREMITY ANGIOGRAPHY Right 12/21/2018   Procedure: Lower Extremity Angiography;  Surgeon: Annice Needy, MD;  Location: [facility];  Service: Cardiovascular;  Laterality: Right;   LOWER EXTREMITY ANGIOGRAPHY Left 12/25/2020   Procedure: LOWER EXTREMITY ANGIOGRAPHY;  Surgeon: Annice Needy, MD;  Location:  [facility];  Service: Cardiovascular;  Laterality: Left;   LOWER EXTREMITY ANGIOGRAPHY Left 09/21/2021   Procedure: Lower Extremity Angiography;  Surgeon: Annice Needy, MD;  Location: [facility];  Service: Cardiovascular;  Laterality: Left;   LOWER EXTREMITY ANGIOGRAPHY Left 10/13/2021   Procedure: Lower Extremity Angiography;  Surgeon: Renford Dills, MD;  Location: [facility];  Service: Cardiovascular;  Laterality: Left;   LOWER EXTREMITY ANGIOGRAPHY Left 10/14/2021   Procedure: Lower Extremity Angiography;  Surgeon: Annice Needy, MD;  Location: [facility];  Service: Cardiovascular;  Laterality: Left;   LOWER EXTREMITY ANGIOGRAPHY Left 11/02/2021   Procedure: Lower Extremity Angiography;  Surgeon: Annice Needy, MD;  Location: [facility];  Service: Cardiovascular;  Laterality: Left;   LOWER EXTREMITY INTERVENTION N/A 12/22/2018   Procedure: LOWER EXTREMITY INTERVENTION;  Surgeon: Annice Needy, MD;  Location: [facility];  Service: Cardiovascular;  Laterality: N/A;   LOWER EXTREMITY INTERVENTION Left 12/26/2020   Procedure: LOWER EXTREMITY INTERVENTION;  Surgeon: Annice Needy, MD;  Location: [facility];  Service: Cardiovascular;  Laterality: Left;   LOWER EXTREMITY INTERVENTION N/A 09/22/2021   Procedure: LOWER EXTREMITY INTERVENTION;  Surgeon: Annice Needy, MD;  Location: [facility];  Service: Cardiovascular;  Laterality: N/A;     Social History   Tobacco Use   Smoking status: Former    Packs/day: 0.25    Years: 45.00    Total pack years: 11.25    Types: Cigarettes   Smokeless tobacco: Never   Tobacco comments:    quit  Vaping Use   Vaping Use: Never used  Substance Use Topics   Alcohol use: No   Drug use: Not Currently    Types: Cocaine    Comment: last used in April 2021 per patient-last 2 drug tests have been negative for cocaine     Family History  Problem Relation Age of Onset   Breast cancer Mother  92     Allergies  Allergen Reactions   Vancomycin Rash    Patient developed a rash to injection site and arm a few mintes after starting ABX.    Hydrocodone Rash   Metformin And Related Diarrhea   Penicillins Hives    Has patient had a PCN reaction causing immediate rash, facial/tongue/throat swelling, SOB or lightheadedness with hypotension: Yes Has patient had a PCN reaction causing severe rash involving mucus membranes or skin necrosis: No Has patient had a PCN reaction that required hospitalization: No Has patient had  a PCN reaction occurring within the last 10 years: No If all of the above answers are "NO", then may proceed with Cephalosporin use.   Tramadol Itching     REVIEW OF SYSTEMS (Negative unless checked)  Constitutional: [] Weight loss  [] Fever  [] Chills Cardiac: [] Chest pain   [] Chest pressure   [] Palpitations   [] Shortness of breath when laying flat   [] Shortness of breath at rest   [x] Shortness of breath with exertion. Vascular:  [] Pain in legs with walking   [] Pain in legs at rest   [] Pain in legs when laying flat   [] Claudication   [] Pain in feet when walking  [] Pain in feet at rest  [] Pain in feet when laying flat   [] History of DVT   [] Phlebitis   [] Swelling in legs   [] Varicose veins   [] Non-healing ulcers Pulmonary:   [] Uses home oxygen   [] Productive cough   [] Hemoptysis   [] Wheeze  [x] COPD   [] Asthma Neurologic:  [] Dizziness  [] Blackouts   [] Seizures   [] History of stroke   [] History of TIA  [] Aphasia   [] Temporary blindness   [] Dysphagia   [] Weakness or numbness in arms   [] Weakness or numbness in legs Musculoskeletal:  [x] Arthritis   [] Joint swelling   [] Joint pain   [] Low back pain Hematologic:  [] Easy bruising  [] Easy bleeding   [] Hypercoagulable state   [] Anemic   Gastrointestinal:  [] Blood in stool   [] Vomiting blood  [x] Gastroesophageal reflux/heartburn   [] Abdominal pain Genitourinary:  [] Chronic kidney disease   [] Difficult urination  [] Frequent  urination  [] Burning with urination   [] Hematuria Skin:  [] Rashes   [x] Ulcers   [x] Wounds Psychological:  [] History of anxiety   []  History of major depression.  Physical Examination  BP (!) 140/75 (BP Location: Right Arm)   Pulse 98   Resp 18   Ht 5\' 7"  (1.702 m)   Wt 130 lb (59 kg)   BMI 20.36 kg/m  Gen:  WD/WN, NAD Head: [place]/AT, No temporalis wasting. Ear/Nose/Throat: Hearing grossly intact, nares w/o erythema or drainage Eyes: Conjunctiva clear. Sclera non-icteric Neck: Supple.  Trachea midline Pulmonary:  Good air movement, no use of accessory muscles.  Cardiac: RRR, no JVD Vascular:  Vessel Right Left  Radial Palpable Palpable           Musculoskeletal: M/S 5/5 throughout.  No deformity or atrophy. Right BKA well healed. Left BKA has a contracture and can not be straightened.  There is a small area of fibrinous exudate on the medial aspect of the left BKA incision. Neurologic: Sensation grossly intact in extremities.  Symmetrical.  Speech is fluent.  Psychiatric: Judgment intact, Mood & affect appropriate for pt's clinical situation. Dermatologic: Small wound with fibrinous exudate on the medial aspect of the left BKA incision.      Labs Recent Results (from the past 2160 hour(s))  Aerobic culture     Status: Abnormal   Collection Time: 02/16/22 12:01 PM  Result Value Ref Range   Aerobic Bacterial Culture Final report (A)    Organism ID, Bacteria Staphylococcus aureus (A)     Comment: Based on susceptibility to oxacillin this isolate would be susceptible to: *Penicillinase-stable penicillins, such as:   Cloxacillin, Dicloxacillin, Nafcillin *Beta-lactam combination agents, such as:   Amoxicillin-clavulanic acid, Ampicillin-sulbactam,   Piperacillin-tazobactam *Oral cephems, such as:   Cefaclor, Cefdinir, Cefpodoxime, Cefprozil, Cefuroxime,   Cephalexin, Loracarbef *Parenteral cephems, such as:   Cefazolin, Cefepime, Cefotaxime, Cefotetan, Ceftaroline,    Ceftizoxime, Ceftriaxone, Cefuroxime *Carbapenems, such as:  Doripenem, Ertapenem, Imipenem, Meropenem Most isolates of Staphylococcus sp. produce a beta-lactamase enzyme rendering them resistant to penicillin. Please contact the laboratory if penicillin is being considered for therapy. Heavy growth    Antimicrobial Susceptibility Comment     Comment:       ** S = Susceptible; I = Intermediate; R = Resistant **                    P = Positive; N = Negative             MICS are expressed in micrograms per mL    Antibiotic                 RSLT#1    RSLT#2    RSLT#3    RSLT#4 Ciprofloxacin                  S Clindamycin                    S Erythromycin                   S Gentamicin                     S Levofloxacin                   S Linezolid                      S Moxifloxacin                   S Oxacillin                      S Quinupristin/Dalfopristin      S Rifampin                       S Tetracycline                   S Trimethoprim/Sulfa             S Vancomycin                     S   CBC     Status: Abnormal   Collection Time: 02/22/22  7:57 PM  Result Value Ref Range   WBC 8.7 4.0 - 10.5 K/uL   RBC 4.13 3.87 - 5.11 MIL/uL   Hemoglobin 11.4 (L) 12.0 - 15.0 g/dL   HCT 16.1 (L) 09.6 - 04.5 %   MCV 80.4 80.0 - 100.0 fL   MCH 27.6 26.0 - 34.0 pg   MCHC 34.3 30.0 - 36.0 g/dL   RDW 40.9 81.1 - 91.4 %   Platelets 547 (H) 150 - 400 K/uL   nRBC 0.0 0.0 - 0.2 %    Comment: Performed at [facility], [address] [place], [place] [address]  Basic metabolic panel     Status: Abnormal   Collection Time: 02/22/22  7:57 PM  Result Value Ref Range   Sodium 140 135 - 145 mmol/L   Potassium 4.1 3.5 - 5.1 mmol/L   Chloride 110 98 - 111 mmol/L   CO2 21 (L) 22 - 32 mmol/L   Glucose, Bld 180 (H) 70 - 99 mg/dL    Comment: Glucose reference range applies only to samples taken after fasting for at least 8 hours.   BUN  15 8 - 23 mg/dL   Creatinine, Ser 1.03 (H) 0.44  - 1.00 mg/dL   Calcium 9.6 8.9 - 10.3 mg/dL   GFR, Estimated 60 (L) >60 mL/min    Comment: (NOTE) Calculated using the CKD-EPI Creatinine Equation (2021)    Anion gap 9 5 - 15    Comment: Performed at [facility], [address] [place], [place] [address]  Procalcitonin - Baseline     Status: None   Collection Time: 02/22/22  7:57 PM  Result Value Ref Range   Procalcitonin <0.10 ng/mL    Comment:        Interpretation: PCT (Procalcitonin) <= 0.5 ng/mL: Systemic infection (sepsis) is not likely. Local bacterial infection is possible. (NOTE)       Sepsis PCT Algorithm           Lower Respiratory Tract                                      Infection PCT Algorithm    ----------------------------     ----------------------------         PCT < 0.25 ng/mL                PCT < 0.10 ng/mL          Strongly encourage             Strongly discourage   discontinuation of antibiotics    initiation of antibiotics    ----------------------------     -----------------------------       PCT 0.25 - 0.50 ng/mL            PCT 0.10 - 0.25 ng/mL               OR       >80% decrease in PCT            Discourage initiation of                                            antibiotics      Encourage discontinuation           of antibiotics    ----------------------------     -----------------------------         PCT >= 0.50 ng/mL              PCT 0.26 - 0.50 ng/mL               AND        <80% decrease in PCT             Encourage initiation of                                             antibiotics       Encourage continuation           of antibiotics    ----------------------------     -----------------------------        PCT >= 0.50 ng/mL                  PCT > 0.50 ng/mL  AND         increase in PCT                  Strongly encourage                                      initiation of antibiotics    Strongly encourage escalation           of antibiotics                                      -----------------------------                                           PCT <= 0.25 ng/mL                                                 OR                                        > 80% decrease in PCT                                      Discontinue / Do not initiate                                             antibiotics  Performed at [facility], [address] [place], [place] [address]   CBG monitoring, ED     Status: Abnormal   Collection Time: 02/23/22  2:53 AM  Result Value Ref Range   Glucose-Capillary 266 (H) 70 - 99 mg/dL    Comment: Glucose reference range applies only to samples taken after fasting for at least 8 hours.  HIV Antibody (routine testing w rflx)     Status: None   Collection Time: 02/23/22  3:45 AM  Result Value Ref Range   HIV Screen 4th Generation wRfx Non Reactive Non Reactive    Comment: Performed at [facility], [address], [place], [place] [address]  Protime-INR     Status: None   Collection Time: 02/23/22  3:45 AM  Result Value Ref Range   Prothrombin Time 14.4 11.4 - 15.2 seconds   INR 1.1 0.8 - 1.2    Comment: (NOTE) INR goal varies based on device and disease states. Performed at [facility], [address] [place], [place] [address]   Cortisol-am, blood     Status: Abnormal   Collection Time: 02/23/22  3:45 AM  Result Value Ref Range   Cortisol - AM 33.6 (H) 6.7 - 22.6 ug/dL    Comment: Performed at [facility], [address], [place], [place] [address]  Procalcitonin     Status: None   Collection Time: 02/23/22  3:45 AM  Result Value Ref Range   Procalcitonin <0.10 ng/mL    Comment:        Interpretation: PCT (Procalcitonin) <= 0.5 ng/mL: Systemic infection (sepsis) is not likely. Local bacterial infection is possible. (NOTE)       Sepsis PCT Algorithm           Lower Respiratory Tract                                      Infection PCT Algorithm     ----------------------------     ----------------------------         PCT < 0.25 ng/mL                PCT < 0.10 ng/mL          Strongly encourage             Strongly discourage   discontinuation of antibiotics    initiation of antibiotics    ----------------------------     -----------------------------       PCT 0.25 - 0.50 ng/mL            PCT 0.10 - 0.25 ng/mL               OR       >80% decrease in PCT            Discourage initiation of                                            antibiotics      Encourage discontinuation           of antibiotics    ----------------------------     -----------------------------         PCT >= 0.50 ng/mL              PCT 0.26 - 0.50 ng/mL               AND        <80% decrease in PCT             Encourage initiation of                                             antibiotics       Encourage continuation           of antibiotics    ----------------------------     -----------------------------        PCT >= 0.50 ng/mL                  PCT > 0.50 ng/mL               AND         increase in PCT                  Strongly encourage                                      initiation of antibiotics    Strongly encourage escalation  of antibiotics                                     -----------------------------                                           PCT <= 0.25 ng/mL                                                 OR                                        > 80% decrease in PCT                                      Discontinue / Do not initiate                                             antibiotics  Performed at [facility], [address] [place], [place] [address]   Basic metabolic panel     Status: Abnormal   Collection Time: 02/23/22  3:45 AM  Result Value Ref Range   Sodium 141 135 - 145 mmol/L   Potassium 3.9 3.5 - 5.1 mmol/L   Chloride 112 (H) 98 - 111 mmol/L   CO2 21 (L) 22 - 32 mmol/L   Glucose, Bld 277 (H) 70 -  99 mg/dL    Comment: Glucose reference range applies only to samples taken after fasting for at least 8 hours.   BUN 16 8 - 23 mg/dL   Creatinine, Ser 6.041.23 (H) 0.44 - 1.00 mg/dL   Calcium 9.7 8.9 - 54.010.3 mg/dL   GFR, Estimated 48 (L) >60 mL/min    Comment: (NOTE) Calculated using the CKD-EPI Creatinine Equation (2021)    Anion gap 8 5 - 15    Comment: Performed at [facility], [address] [place], [place] [address]  CBC     Status: Abnormal   Collection Time: 02/23/22  3:45 AM  Result Value Ref Range   WBC 10.3 4.0 - 10.5 K/uL   RBC 3.79 (L) 3.87 - 5.11 MIL/uL   Hemoglobin 10.4 (L) 12.0 - 15.0 g/dL   HCT 14.730.5 (L) 82.936.0 - 56.246.0 %   MCV 80.5 80.0 - 100.0 fL   MCH 27.4 26.0 - 34.0 pg   MCHC 34.1 30.0 - 36.0 g/dL   RDW 13.014.7 86.511.5 - 78.415.5 %   Platelets 521 (H) 150 - 400 K/uL   nRBC 0.0 0.0 - 0.2 %    Comment: Performed at [facility], [address] [place], [place] [address]  Glucose, capillary     Status: Abnormal   Collection Time: 02/23/22 11:20 AM  Result Value Ref Range   Glucose-Capillary 204 (H) 70 - 99 mg/dL    Comment: Glucose reference range applies only to samples taken  after fasting for at least 8 hours.  Glucose, capillary     Status: Abnormal   Collection Time: 02/23/22  4:17 PM  Result Value Ref Range   Glucose-Capillary 218 (H) 70 - 99 mg/dL    Comment: Glucose reference range applies only to samples taken after fasting for at least 8 hours.  Glucose, capillary     Status: Abnormal   Collection Time: 02/23/22  9:32 PM  Result Value Ref Range   Glucose-Capillary 179 (H) 70 - 99 mg/dL    Comment: Glucose reference range applies only to samples taken after fasting for at least 8 hours.  Glucose, capillary     Status: Abnormal   Collection Time: 02/24/22  5:32 AM  Result Value Ref Range   Glucose-Capillary 198 (H) 70 - 99 mg/dL    Comment: Glucose reference range applies only to samples taken after fasting for at least 8 hours.  Type and  screen     Status: None   Collection Time: 02/24/22  6:45 AM  Result Value Ref Range   ABO/RH(D) O POS    Antibody Screen NEG    Sample Expiration      02/27/2022,2359 Performed at [facility], [address] [place], [place] [address]   Glucose, capillary     Status: Abnormal   Collection Time: 02/24/22  8:03 AM  Result Value Ref Range   Glucose-Capillary 191 (H) 70 - 99 mg/dL    Comment: Glucose reference range applies only to samples taken after fasting for at least 8 hours.  Glucose, capillary     Status: Abnormal   Collection Time: 02/24/22 11:29 AM  Result Value Ref Range   Glucose-Capillary 140 (H) 70 - 99 mg/dL    Comment: Glucose reference range applies only to samples taken after fasting for at least 8 hours.   Comment 1 Notify RN    Comment 2 Document in Chart   Glucose, capillary     Status: Abnormal   Collection Time: 02/24/22  1:50 PM  Result Value Ref Range   Glucose-Capillary 148 (H) 70 - 99 mg/dL    Comment: Glucose reference range applies only to samples taken after fasting for at least 8 hours.  Glucose, capillary     Status: Abnormal   Collection Time: 02/24/22  4:11 PM  Result Value Ref Range   Glucose-Capillary 183 (H) 70 - 99 mg/dL    Comment: Glucose reference range applies only to samples taken after fasting for at least 8 hours.  Glucose, capillary     Status: Abnormal   Collection Time: 02/24/22  9:30 PM  Result Value Ref Range   Glucose-Capillary 246 (H) 70 - 99 mg/dL    Comment: Glucose reference range applies only to samples taken after fasting for at least 8 hours.  CBC with Differential/Platelet     Status: Abnormal   Collection Time: 02/25/22  4:18 AM  Result Value Ref Range   WBC 8.7 4.0 - 10.5 K/uL   RBC 3.46 (L) 3.87 - 5.11 MIL/uL   Hemoglobin 9.7 (L) 12.0 - 15.0 g/dL   HCT 28.1 (L) 36.0 - 46.0 %   MCV 81.2 80.0 - 100.0 fL   MCH 28.0 26.0 - 34.0 pg   MCHC 34.5 30.0 - 36.0 g/dL   RDW 14.3 11.5 - 15.5 %   Platelets 415 (H)  150 - 400 K/uL   nRBC 0.0 0.0 - 0.2 %   Neutrophils Relative % 61 %   Neutro Abs 5.3  1.7 - 7.7 K/uL   Lymphocytes Relative 28 %   Lymphs Abs 2.4 0.7 - 4.0 K/uL   Monocytes Relative 8 %   Monocytes Absolute 0.7 0.1 - 1.0 K/uL   Eosinophils Relative 2 %   Eosinophils Absolute 0.1 0.0 - 0.5 K/uL   Basophils Relative 1 %   Basophils Absolute 0.1 0.0 - 0.1 K/uL   Immature Granulocytes 0 %   Abs Immature Granulocytes 0.03 0.00 - 0.07 K/uL    Comment: Performed at [facility], [address] [place], [place] [address]  Basic metabolic panel     Status: Abnormal   Collection Time: 02/25/22  4:18 AM  Result Value Ref Range   Sodium 137 135 - 145 mmol/L   Potassium 4.2 3.5 - 5.1 mmol/L   Chloride 107 98 - 111 mmol/L   CO2 21 (L) 22 - 32 mmol/L   Glucose, Bld 211 (H) 70 - 99 mg/dL    Comment: Glucose reference range applies only to samples taken after fasting for at least 8 hours.   BUN 24 (H) 8 - 23 mg/dL   Creatinine, Ser 6.04 (H) 0.44 - 1.00 mg/dL   Calcium 9.8 8.9 - 54.0 mg/dL   GFR, Estimated 51 (L) >60 mL/min    Comment: (NOTE) Calculated using the CKD-EPI Creatinine Equation (2021)    Anion gap 9 5 - 15    Comment: Performed at [facility], [address] [place], [place] [address]  Glucose, capillary     Status: Abnormal   Collection Time: 02/25/22  8:00 AM  Result Value Ref Range   Glucose-Capillary 189 (H) 70 - 99 mg/dL    Comment: Glucose reference range applies only to samples taken after fasting for at least 8 hours.   Comment 1 Notify RN    Comment 2 Document in Chart   Glucose, capillary     Status: Abnormal   Collection Time: 02/25/22 11:48 AM  Result Value Ref Range   Glucose-Capillary 141 (H) 70 - 99 mg/dL    Comment: Glucose reference range applies only to samples taken after fasting for at least 8 hours.   Comment 1 Notify RN    Comment 2 Document in Chart   Glucose, capillary     Status: Abnormal   Collection Time: 02/25/22  4:42 PM   Result Value Ref Range   Glucose-Capillary 125 (H) 70 - 99 mg/dL    Comment: Glucose reference range applies only to samples taken after fasting for at least 8 hours.   Comment 1 Notify RN    Comment 2 Document in Chart     Radiology No results found.  Assessment/Plan  Essential hypertension blood pressure control important in reducing the progression of atherosclerotic disease. On appropriate oral medications.   Type 2 diabetes mellitus with peripheral neuropathy (HCC) blood glucose control important in reducing the progression of atherosclerotic disease. Also, involved in wound healing. On appropriate medications.   HLD (hyperlipidemia) lipid control important in reducing the progression of atherosclerotic disease. Continue statin therapy   Atherosclerosis of artery of extremity with rest pain [facility]) The patient has a contracture of the left BKA.  I discussed with her that I think this will ultimately require revision to an amputation but she is adamantly opposed to that at this point nonconversant.  I have given her some pain medications today but ultimately I do not think this will likely be a situation which will be tolerable for her.  Will plan to see  her back in a few months.    [name], MD  05/11/2022 12:42 PM    This note was created with Dragon medical transcription system.  Any errors from dictation are purely unintentional

## 2022-05-11 NOTE — Assessment & Plan Note (Signed)
blood pressure control important in reducing the progression of atherosclerotic disease. On appropriate oral medications.  

## 2022-05-11 NOTE — Assessment & Plan Note (Signed)
blood glucose control important in reducing the progression of atherosclerotic disease. Also, involved in wound healing. On appropriate medications.  

## 2022-05-11 NOTE — Assessment & Plan Note (Signed)
lipid control important in reducing the progression of atherosclerotic disease. Continue statin therapy  

## 2022-05-28 ENCOUNTER — Encounter: Payer: Self-pay | Admitting: Oncology

## 2022-06-04 ENCOUNTER — Inpatient Hospital Stay: Payer: 59 | Attending: Oncology

## 2022-06-04 DIAGNOSIS — Z89512 Acquired absence of left leg below knee: Secondary | ICD-10-CM | POA: Insufficient documentation

## 2022-06-04 DIAGNOSIS — Z7985 Long-term (current) use of injectable non-insulin antidiabetic drugs: Secondary | ICD-10-CM | POA: Diagnosis not present

## 2022-06-04 DIAGNOSIS — Z7982 Long term (current) use of aspirin: Secondary | ICD-10-CM | POA: Diagnosis not present

## 2022-06-04 DIAGNOSIS — N189 Chronic kidney disease, unspecified: Secondary | ICD-10-CM | POA: Diagnosis present

## 2022-06-04 DIAGNOSIS — Z7901 Long term (current) use of anticoagulants: Secondary | ICD-10-CM | POA: Insufficient documentation

## 2022-06-04 DIAGNOSIS — D638 Anemia in other chronic diseases classified elsewhere: Secondary | ICD-10-CM | POA: Diagnosis present

## 2022-06-04 DIAGNOSIS — Z8711 Personal history of peptic ulcer disease: Secondary | ICD-10-CM | POA: Diagnosis not present

## 2022-06-04 DIAGNOSIS — Z7984 Long term (current) use of oral hypoglycemic drugs: Secondary | ICD-10-CM | POA: Diagnosis not present

## 2022-06-04 DIAGNOSIS — Z79899 Other long term (current) drug therapy: Secondary | ICD-10-CM | POA: Diagnosis not present

## 2022-06-04 DIAGNOSIS — Z89511 Acquired absence of right leg below knee: Secondary | ICD-10-CM | POA: Diagnosis not present

## 2022-06-04 DIAGNOSIS — Z9049 Acquired absence of other specified parts of digestive tract: Secondary | ICD-10-CM | POA: Diagnosis not present

## 2022-06-04 DIAGNOSIS — Z90710 Acquired absence of both cervix and uterus: Secondary | ICD-10-CM | POA: Diagnosis not present

## 2022-06-04 DIAGNOSIS — M069 Rheumatoid arthritis, unspecified: Secondary | ICD-10-CM | POA: Diagnosis present

## 2022-06-04 LAB — IRON AND TIBC
Iron: 51 ug/dL (ref 28–170)
Saturation Ratios: 16 % (ref 10.4–31.8)
TIBC: 315 ug/dL (ref 250–450)
UIBC: 264 ug/dL

## 2022-06-04 LAB — CBC WITH DIFFERENTIAL/PLATELET
Abs Immature Granulocytes: 0.03 10*3/uL (ref 0.00–0.07)
Basophils Absolute: 0.1 10*3/uL (ref 0.0–0.1)
Basophils Relative: 1 %
Eosinophils Absolute: 0.4 10*3/uL (ref 0.0–0.5)
Eosinophils Relative: 4 %
HCT: 30.9 % — ABNORMAL LOW (ref 36.0–46.0)
Hemoglobin: 10.5 g/dL — ABNORMAL LOW (ref 12.0–15.0)
Immature Granulocytes: 0 %
Lymphocytes Relative: 27 %
Lymphs Abs: 2.7 10*3/uL (ref 0.7–4.0)
MCH: 27.9 pg (ref 26.0–34.0)
MCHC: 34 g/dL (ref 30.0–36.0)
MCV: 82.2 fL (ref 80.0–100.0)
Monocytes Absolute: 0.7 10*3/uL (ref 0.1–1.0)
Monocytes Relative: 8 %
Neutro Abs: 5.9 10*3/uL (ref 1.7–7.7)
Neutrophils Relative %: 60 %
Platelets: 392 10*3/uL (ref 150–400)
RBC: 3.76 MIL/uL — ABNORMAL LOW (ref 3.87–5.11)
RDW: 14.7 % (ref 11.5–15.5)
WBC: 9.8 10*3/uL (ref 4.0–10.5)
nRBC: 0 % (ref 0.0–0.2)

## 2022-06-04 LAB — FERRITIN: Ferritin: 72 ng/mL (ref 11–307)

## 2022-06-08 ENCOUNTER — Inpatient Hospital Stay: Payer: 59

## 2022-06-08 ENCOUNTER — Encounter: Payer: Self-pay | Admitting: Oncology

## 2022-06-08 ENCOUNTER — Inpatient Hospital Stay (HOSPITAL_BASED_OUTPATIENT_CLINIC_OR_DEPARTMENT_OTHER): Payer: 59 | Admitting: Oncology

## 2022-06-08 VITALS — BP 172/86 | HR 95 | Resp 18

## 2022-06-08 VITALS — BP 157/74 | HR 97 | Temp 96.7°F | Resp 18 | Wt 131.0 lb

## 2022-06-08 DIAGNOSIS — D638 Anemia in other chronic diseases classified elsewhere: Secondary | ICD-10-CM

## 2022-06-08 MED ORDER — SODIUM CHLORIDE 0.9 % IV SOLN
Freq: Once | INTRAVENOUS | Status: AC
  Filled 2022-06-08: qty 250

## 2022-06-08 MED ORDER — SODIUM CHLORIDE 0.9 % IV SOLN
200.0000 mg | Freq: Once | INTRAVENOUS | Status: AC
  Administered 2022-06-08: 200 mg via INTRAVENOUS
  Filled 2022-06-08: qty 200

## 2022-06-08 NOTE — Assessment & Plan Note (Signed)
Anemia of chronic disease - RA and CKD. Labs are reviewed and discussed with patient.   Hemoglobin >10. Recommend patient to proceed with Venofer 200 mg x 2 to further increase iron stores. No need for erythropoietin replacement therapy currently.

## 2022-06-08 NOTE — Patient Instructions (Signed)

## 2022-06-08 NOTE — Progress Notes (Signed)
Hematology/Oncology Progress note [phone] [phone]     REFERRING PROVIDER: [facility] [place] VISIT:  Follow up of anemia  ASSESSMENT & PLAN:   Anemia of chronic disease Anemia of chronic disease - RA and CKD. Labs are reviewed and discussed with patient.   Hemoglobin >10. Recommend patient to proceed with Venofer 200 mg x 2 to further increase iron stores. No need for erythropoietin replacement therapy currently.   Orders Placed This Encounter  Procedures   CBC with Differential ([place] Only)    Standing Status:   Future    Standing Expiration Date:   06/09/2023   Iron and TIBC    Standing Status:   Future    Standing Expiration Date:   06/09/2023   Ferritin    Standing Status:   Future    Standing Expiration Date:   06/09/2023   Retic Panel    Standing Status:   Future    Standing Expiration Date:   06/09/2023   Follow-up in 6 months All questions were answered. The patient knows to call the clinic with any problems, questions or concerns.  Earlie Server, MD, PhD [facility] Hematology Oncology 06/08/2022    HISTORY OF PRESENTING ILLNESS:   Reviewed patient's recent labs  05/09/2019, hemoglobin 8.1, MCV 88, platelet 470. 04/16/2019, iron panel showed saturation 18, ferritin 28, TIBC 308 Patient has been started on oral iron supplementation NiFerrex 150 mg daily.  She reports tolerating well.  Reviewed patient's previous labs ordered by primary care physician's office, anemia is chronic onset , duration is since at least July 2017.  Patient's hemoglobin was about 9 back in September 2020.  She was admitted at that time due to severe PAD and right foot gangrene.  Status post right below-knee amputation.   She was admitted in December due to acute drop of hemoglobin to 6.  She has a history of GI bleed secondary to gastric ulcer.  Received PRBC transfusion.  Patient is on Eliquis 2.5 mg twice daily. No  aggravating or improving factors.  Last endoscopy: 04/13/2019 EGD colonoscopy and small bowel capsule study during her hospitalization.  EGD was negative, 04/16/2019 colonoscopy with small polyps removed, 04/18/2019 small bowel capsule study did not reach this small bowel and was inconclusive.   History of recurrent acute GI bleeding/melena, history of PUD on chronic anticoagulation. 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    RA, follows up with rheumatology Dr.Patel. On MTX with folic acid and plaquenil.   11/05/2021-11/09/2021, patient was hospitalized due to peripheral artery disease, status post left below-knee amputation. Patient is  on Eliquis 5 mg twice daily as well as aspirin 81 mg daily.  INTERVAL HISTORY Sylvia [name] is a 67 y.o. female who has above history reviewed by me today presents for follow up visit for management of anemia Patient reports feeling okay.  Chronic fatigue unchanged. She has no new complaints.  Review of Systems  Constitutional:  Positive for fatigue. Negative for appetite change, chills, fever and unexpected weight change.  HENT:   Negative for hearing loss and voice change.   Eyes:  Negative for eye problems.  Respiratory:  Negative for chest tightness and cough.   Cardiovascular:  Negative for chest pain.  Gastrointestinal:  Negative for abdominal distention, abdominal pain and blood in stool.  Endocrine: Negative for hot flashes.  Genitourinary:  Negative for difficulty urinating and frequency.   Musculoskeletal:  Negative for arthralgias.  Skin:  Negative for itching and rash.  Neurological:  Negative for extremity weakness.  Hematological:  Negative for adenopathy.  Psychiatric/Behavioral:  Negative for confusion.     MEDICAL HISTORY:  Past Medical History:  Diagnosis Date   Anemia of chronic disease 05/16/2019   Anxiety    h/o   Arthritis    Cocaine abuse ([place])    COPD (chronic obstructive pulmonary disease) (HCC)     Depression    Diabetes mellitus without complication (HCC)    DKA (diabetic ketoacidosis) (HCC)    Elevated troponin    GERD (gastroesophageal reflux disease)    GI bleed    Hypertension    bp under control-off meds since 2019   Ischemic leg     SURGICAL HISTORY: Past Surgical History:  Procedure Laterality Date   ABDOMINAL HYSTERECTOMY     AMPUTATION Right 12/27/2018   Procedure: AMPUTATION BELOW KNEE;  Surgeon: Algernon Huxley, MD;  Location: [facility];  Service: General;  Laterality: Right;   AMPUTATION Left 11/05/2021   Procedure: AMPUTATION BELOW KNEE;  Surgeon: Algernon Huxley, MD;  Location: [facility];  Service: Vascular;  Laterality: Left;   APPLICATION OF WOUND VAC  10/16/2021   Procedure: APPLICATION OF WOUND VAC;  Surgeon: Algernon Huxley, MD;  Location: [facility];  Service: Vascular;;   CATARACT EXTRACTION W/PHACO Right 03/27/2020   Procedure: CATARACT EXTRACTION PHACO AND INTRAOCULAR LENS PLACEMENT ([place]) RIGHT DIABETIC 7.54 00:52.5;  Surgeon: Birder Robson, MD;  Location: [place];  Service: Ophthalmology;  Laterality: Right;   CATARACT EXTRACTION W/PHACO Left 05/20/2020   Procedure: CATARACT EXTRACTION PHACO AND INTRAOCULAR LENS PLACEMENT ([facility]) LEFT DIABETIC 4.95 00:37.6;  Surgeon: Birder Robson, MD;  Location: [place];  Service: Ophthalmology;  Laterality: Left;  Diabetic - oral meds COVID + 04-24-20   CHOLECYSTECTOMY     COLONOSCOPY WITH PROPOFOL N/A 04/16/2019   Procedure: COLONOSCOPY WITH PROPOFOL;  Surgeon: Virgel Manifold, MD;  Location: [facility];  Service: Endoscopy;  Laterality: N/A;   COLONOSCOPY WITH PROPOFOL N/A 01/16/2020   Procedure: COLONOSCOPY WITH PROPOFOL;  Surgeon: Virgel Manifold, MD;  Location: [facility];  Service: Endoscopy;  Laterality: N/A;   ENDARTERECTOMY FEMORAL Left 10/16/2021   Procedure: ENDARTERECTOMY FEMORAL;  Surgeon: Algernon Huxley, MD;  Location: [facility];  Service: Vascular;  Laterality: Left;    ESOPHAGOGASTRODUODENOSCOPY (EGD) WITH PROPOFOL N/A 06/15/2018   Procedure: ESOPHAGOGASTRODUODENOSCOPY (EGD) WITH PROPOFOL;  Surgeon: [place], Benay Pike, MD;  Location: [facility];  Service: Gastroenterology;  Laterality: N/A;   ESOPHAGOGASTRODUODENOSCOPY (EGD) WITH PROPOFOL N/A 01/03/2019   Procedure: ESOPHAGOGASTRODUODENOSCOPY (EGD) WITH PROPOFOL;  Surgeon: Lucilla Lame, MD;  Location: [facility];  Service: Endoscopy;  Laterality: N/A;   ESOPHAGOGASTRODUODENOSCOPY (EGD) WITH PROPOFOL N/A 09/25/2021   Procedure: ESOPHAGOGASTRODUODENOSCOPY (EGD) WITH PROPOFOL;  Surgeon: Lucilla Lame, MD;  Location: [facility];  Service: Endoscopy;  Laterality: N/A;   GIVENS CAPSULE STUDY  04/16/2019   Procedure: GIVENS CAPSULE STUDY;  Surgeon: Virgel Manifold, MD;  Location: [facility];  Service: Endoscopy;;   GIVENS CAPSULE STUDY N/A 06/25/2019   Procedure: GIVENS CAPSULE STUDY;  Surgeon: Jonathon Bellows, MD;  Location: [facility];  Service: Gastroenterology;  Laterality: N/A;   INCISION AND DRAINAGE OF WOUND Left 02/24/2022   Procedure: LEFT BKA IRRIGATION AND DEBRIDEMENT WOUND;  Surgeon: Algernon Huxley, MD;  Location: [facility];  Service: Vascular;  Laterality: Left;   LOWER EXTREMITY ANGIOGRAPHY Right 08/21/2018   Procedure: LOWER EXTREMITY ANGIOGRAPHY;  Surgeon: Algernon Huxley, MD;  Location: [place] [facility];  Service: Cardiovascular;  Laterality: Right;   LOWER EXTREMITY ANGIOGRAPHY Left 08/28/2018   Procedure: LOWER EXTREMITY ANGIOGRAPHY;  Surgeon: Algernon Huxley, MD;  Location: [place] [facility];  Service: Cardiovascular;  Laterality: Left;   LOWER EXTREMITY ANGIOGRAPHY Right 08/28/2018   Procedure: Lower Extremity Angiography;  Surgeon: Algernon Huxley, MD;  Location: [place] [facility];  Service: Cardiovascular;  Laterality: Right;   LOWER EXTREMITY ANGIOGRAPHY Right 12/18/2018   Procedure: Lower Extremity Angiography;  Surgeon: Algernon Huxley, MD;  Location: [place] [facility];  Service:  Cardiovascular;  Laterality: Right;   LOWER EXTREMITY ANGIOGRAPHY Right 12/18/2018   Procedure: Lower Extremity Angiography;  Surgeon: Algernon Huxley, MD;  Location: [place] [facility];  Service: Cardiovascular;  Laterality: Right;   LOWER EXTREMITY ANGIOGRAPHY Left 12/21/2018   Procedure: Lower Extremity Angiography;  Surgeon: Algernon Huxley, MD;  Location: [place] [facility];  Service: Cardiovascular;  Laterality: Left;   LOWER EXTREMITY ANGIOGRAPHY Right 12/21/2018   Procedure: Lower Extremity Angiography;  Surgeon: Algernon Huxley, MD;  Location: [place] [facility];  Service: Cardiovascular;  Laterality: Right;   LOWER EXTREMITY ANGIOGRAPHY Left 12/25/2020   Procedure: LOWER EXTREMITY ANGIOGRAPHY;  Surgeon: Algernon Huxley, MD;  Location: [place] [facility];  Service: Cardiovascular;  Laterality: Left;   LOWER EXTREMITY ANGIOGRAPHY Left 09/21/2021   Procedure: Lower Extremity Angiography;  Surgeon: Algernon Huxley, MD;  Location: [place] [facility];  Service: Cardiovascular;  Laterality: Left;   LOWER EXTREMITY ANGIOGRAPHY Left 10/13/2021   Procedure: Lower Extremity Angiography;  Surgeon: Katha Cabal, MD;  Location: [place] [facility];  Service: Cardiovascular;  Laterality: Left;   LOWER EXTREMITY ANGIOGRAPHY Left 10/14/2021   Procedure: Lower Extremity Angiography;  Surgeon: Algernon Huxley, MD;  Location: [place] [facility];  Service: Cardiovascular;  Laterality: Left;   LOWER EXTREMITY ANGIOGRAPHY Left 11/02/2021   Procedure: Lower Extremity Angiography;  Surgeon: Algernon Huxley, MD;  Location: [place] [facility];  Service: Cardiovascular;  Laterality: Left;   LOWER EXTREMITY INTERVENTION N/A 12/22/2018   Procedure: LOWER EXTREMITY INTERVENTION;  Surgeon: Algernon Huxley, MD;  Location: [place] [facility];  Service: Cardiovascular;  Laterality: N/A;   LOWER EXTREMITY INTERVENTION Left 12/26/2020   Procedure: LOWER EXTREMITY INTERVENTION;  Surgeon: Algernon Huxley, MD;  Location: [place] [facility];   Service: Cardiovascular;  Laterality: Left;   LOWER EXTREMITY INTERVENTION N/A 09/22/2021   Procedure: LOWER EXTREMITY INTERVENTION;  Surgeon: Algernon Huxley, MD;  Location: [place] [facility];  Service: Cardiovascular;  Laterality: N/A;    SOCIAL HISTORY: Social History   Socioeconomic History   Marital status: Divorced    Spouse name: Not on file   Number of children: 2   Years of education: Not on file   Highest education level: Not on file  Occupational History   Not on file  Tobacco Use   Smoking status: Former    Packs/day: 0.25    Years: 45.00    Total pack years: 11.25    Types: Cigarettes   Smokeless tobacco: Never   Tobacco comments:    quit  Vaping Use   Vaping Use: Never used  Substance and Sexual Activity   Alcohol use: No   Drug use: Not Currently    Types: Cocaine    Comment: last used in April 2021 per patient-last 2 drug tests have been negative for cocaine   Sexual activity: Not Currently  Other Topics Concern   Not on file  Social History Narrative   ** Merged History Encounter **       5 grandchildren and 5 great grandchildren  2 grandchildren live with Pt. (38 & 40 y.o.)   Social Determinants of Health   Financial Resource Strain: Low Risk  (06/14/2018)   Overall Financial Resource Strain (CARDIA)    Difficulty of Paying Living Expenses: Not hard at all  Food Insecurity: No Food Insecurity (02/23/2022)   Hunger Vital Sign    Worried About Running Out of Food in the Last Year: Never true    Ran Out of Food in the Last Year: Never true  Transportation Needs: No Transportation Needs (02/23/2022)   PRAPARE - Hydrologist (Medical): No    Lack of Transportation (Non-Medical): No  Physical Activity: Insufficiently Active (06/14/2018)   Exercise Vital Sign    Days of Exercise per Week: 4 days    Minutes of Exercise per Session: 20 min  Stress: No Stress Concern Present (06/14/2018)   [place]    Feeling of Stress : Only a little  Social Connections: Unknown (06/14/2018)   Social Connection and Isolation Panel [NHANES]    Frequency of Communication with Friends and Family: Patient refused    Frequency of Social Gatherings with Friends and Family: Patient refused    Attends Religious Services: Patient refused    Active Member of Clubs or Organizations: Patient refused    Attends Archivist Meetings: Patient refused    Marital Status: Patient refused  Intimate Partner Violence: Not At Risk (02/23/2022)   Humiliation, Afraid, Rape, and Kick questionnaire    Fear of Current or Ex-Partner: No    Emotionally Abused: No    Physically Abused: No    Sexually Abused: No    FAMILY HISTORY: Family History  Problem Relation Age of Onset   Breast cancer Mother 27    ALLERGIES:  is allergic to vancomycin, hydrocodone, metformin and related, penicillins, and tramadol.  MEDICATIONS:  Current Outpatient Medications  Medication Sig Dispense Refill   acetaminophen (TYLENOL) 325 MG tablet Take 650 mg by mouth every 6 (six) hours as needed.     albuterol (PROVENTIL HFA;VENTOLIN HFA) 108 (90 Base) MCG/ACT inhaler Inhale 2 puffs into the lungs every 6 (six) hours as needed for wheezing or shortness of breath. 1 Inhaler 2   apixaban (ELIQUIS) 5 MG TABS tablet Take 1 tablet (5 mg total) by mouth 2 (two) times daily. 60 tablet 12   atorvastatin (LIPITOR) 10 MG tablet Take 10 mg by mouth at bedtime.     cetirizine (ZYRTEC) 10 MG tablet Take 10 mg by mouth daily as needed for allergies.     docusate sodium (COLACE) 100 MG capsule Take 100 mg by mouth daily.     DULoxetine (CYMBALTA) 60 MG capsule Take 1 capsule (60 mg total) by mouth daily. 30 capsule 3   ferrous sulfate 325 (65 FE) MG tablet Take 325 mg by mouth daily.      folic acid (FOLVITE) 1 MG tablet Take 1 mg by mouth daily.     gabapentin (NEURONTIN) 300 MG capsule TAKE 3 CAPSULES BY MOUTH IN  THE  MORNING AND 3 CAPSULES BY MOUTH  IN THE EVENING 450 capsule 3   hydroxychloroquine (PLAQUENIL) 200 MG tablet Take 200 mg by mouth daily.     JARDIANCE 10 MG TABS tablet Take 10 mg by mouth every morning.     lisinopril (ZESTRIL)  10 MG tablet Take 1 tablet (10 mg total) by mouth daily. (Patient taking differently: Take 10 mg by mouth every morning.) 30 tablet 1   methotrexate (RHEUMATREX) 2.5 MG tablet Take 20 mg by mouth once a week. Has not yet started as of 02/23/22     Multiple Vitamins-Minerals (WOMENS MULTIVITAMIN PO) Take 1 tablet by mouth daily.     ondansetron (ZOFRAN-ODT) 4 MG disintegrating tablet Take 1 tablet (4 mg total) by mouth every 8 (eight) hours as needed for nausea or vomiting. 20 tablet 0   ondansetron (ZOFRAN-ODT) 4 MG disintegrating tablet Take 1 tablet (4 mg total) by mouth every 6 (six) hours as needed for nausea or vomiting. 20 tablet 0   pantoprazole (PROTONIX) 40 MG tablet Take 1 tablet (40 mg total) by mouth daily. (Patient taking differently: Take 40 mg by mouth every morning.) 30 tablet 11   sitaGLIPtin (JANUVIA) 100 MG tablet Take 100 mg by mouth every morning.     VICTOZA 18 MG/3ML SOPN Inject into the skin.     vitamin B-12 (CYANOCOBALAMIN) 500 MCG tablet Take 1 tablet (500 mcg total) by mouth daily. 90 tablet 1   No current facility-administered medications for this visit.     PHYSICAL EXAMINATION: ECOG PERFORMANCE STATUS: 1 - Symptomatic but completely ambulatory Vitals:   06/08/22 1321  BP: (!) 157/74  Pulse: 97  Resp: 18  Temp: (!) 96.7 F (35.9 C)   Filed Weights   06/08/22 1321  Weight: 131 lb (59.4 kg)    Physical Exam Constitutional:      General: She is not in acute distress.    Comments: Patient walks with a cane  HENT:     Head: Normocephalic and atraumatic.  Eyes:     General: No scleral icterus. Cardiovascular:     Rate and Rhythm: Normal rate and regular rhythm.     Heart sounds: Normal heart sounds.  Pulmonary:      Effort: Pulmonary effort is normal. No respiratory distress.     Breath sounds: No wheezing.  Abdominal:     General: Bowel sounds are normal. There is no distension.     Palpations: Abdomen is soft.  Musculoskeletal:        General: No deformity. Normal range of motion.     Cervical back: Normal range of motion and neck supple.     Comments: History of right lower extremity below-knee amputation Status post left below-knee amputation, wound covered with dressing  Skin:    General: Skin is warm and dry.     Findings: No erythema or rash.  Neurological:     Mental Status: She is alert and oriented to person, place, and time. Mental status is at baseline.     Cranial Nerves: No cranial nerve deficit.     Coordination: Coordination normal.  Psychiatric:        Mood and Affect: Mood normal.      LABORATORY DATA:  I have reviewed the data as listed    Latest Ref Rng & Units 06/04/2022    9:59 AM 02/25/2022    4:18 AM 02/23/2022    3:45 AM  CBC  WBC 4.0 - 10.5 K/uL 9.8  8.7  10.3   Hemoglobin 12.0 - 15.0 g/dL 10.5  9.7  10.4   Hematocrit 36.0 - 46.0 % 30.9  28.1  30.5   Platelets 150 - 400 K/uL 392  415  521       Latest Ref Rng & Units 02/25/2022  4:18 AM 02/23/2022    3:45 AM 02/22/2022    7:57 PM  CMP  Glucose 70 - 99 mg/dL 211  277  180   BUN 8 - 23 mg/dL 24  16  15   $ Creatinine 0.44 - 1.00 mg/dL 1.17  1.23  1.03   Sodium 135 - 145 mmol/L 137  141  140   Potassium 3.5 - 5.1 mmol/L 4.2  3.9  4.1   Chloride 98 - 111 mmol/L 107  112  110   CO2 22 - 32 mmol/L 21  21  21   $ Calcium 8.9 - 10.3 mg/dL 9.8  9.7  9.6      Iron/TIBC/Ferritin/ %Sat    Component Value Date/Time   IRON 51 06/04/2022 0959   TIBC 315 06/04/2022 0959   FERRITIN 72 06/04/2022 0959   IRONPCTSAT 16 06/04/2022 0959

## 2022-06-08 NOTE — Progress Notes (Signed)
Pt tolerated treatment well.  VSS.  Pt refused 30 minute post observation, pt has another appt to get to.

## 2022-06-18 ENCOUNTER — Other Ambulatory Visit: Payer: Self-pay | Admitting: Family Medicine

## 2022-06-18 DIAGNOSIS — Z1231 Encounter for screening mammogram for malignant neoplasm of breast: Secondary | ICD-10-CM

## 2022-06-22 ENCOUNTER — Inpatient Hospital Stay: Payer: 59 | Attending: Oncology

## 2022-06-22 VITALS — BP 134/78 | HR 105 | Temp 97.6°F | Resp 18

## 2022-06-22 DIAGNOSIS — M069 Rheumatoid arthritis, unspecified: Secondary | ICD-10-CM | POA: Insufficient documentation

## 2022-06-22 DIAGNOSIS — D638 Anemia in other chronic diseases classified elsewhere: Secondary | ICD-10-CM | POA: Diagnosis present

## 2022-06-22 DIAGNOSIS — N189 Chronic kidney disease, unspecified: Secondary | ICD-10-CM | POA: Diagnosis present

## 2022-06-22 DIAGNOSIS — D538 Other specified nutritional anemias: Secondary | ICD-10-CM | POA: Diagnosis not present

## 2022-06-22 MED ORDER — SODIUM CHLORIDE 0.9 % IV SOLN
200.0000 mg | Freq: Once | INTRAVENOUS | Status: AC
  Administered 2022-06-22: 200 mg via INTRAVENOUS
  Filled 2022-06-22: qty 200

## 2022-06-22 MED ORDER — SODIUM CHLORIDE 0.9 % IV SOLN
Freq: Once | INTRAVENOUS | Status: AC
  Filled 2022-06-22: qty 250

## 2022-07-27 ENCOUNTER — Ambulatory Visit
Admission: RE | Admit: 2022-07-27 | Discharge: 2022-07-27 | Disposition: A | Discharge status: Home / Self Care | Payer: 59 | Source: Ambulatory Visit | Attending: Family Medicine | Admitting: Family Medicine

## 2022-07-27 DIAGNOSIS — Z1231 Encounter for screening mammogram for malignant neoplasm of breast: Secondary | ICD-10-CM | POA: Diagnosis present

## 2022-08-10 ENCOUNTER — Ambulatory Visit (INDEPENDENT_AMBULATORY_CARE_PROVIDER_SITE_OTHER): Payer: 59 | Admitting: Vascular Surgery

## 2022-08-10 VITALS — BP 172/78 | HR 96 | Resp 17

## 2022-08-10 DIAGNOSIS — I739 Peripheral vascular disease, unspecified: Secondary | ICD-10-CM

## 2022-08-10 DIAGNOSIS — I10 Essential (primary) hypertension: Secondary | ICD-10-CM | POA: Diagnosis not present

## 2022-08-10 DIAGNOSIS — E1142 Type 2 diabetes mellitus with diabetic polyneuropathy: Secondary | ICD-10-CM

## 2022-08-10 NOTE — Progress Notes (Signed)
MRN : [id number]  Sylvia [name] is a 67 y.o. (06/06/1955) female who presents with chief complaint of  Chief Complaint  Patient presents with   Follow-up  .  History of Present Illness: Patient returns today in follow up of her amputations.  She has had bilateral BKAs.  She has a contracture on the left side. She has a small wound on the medial aspect on the left side as well.  She has terrible pain in both legs hips pretty much every day.  The pain has become debilitating.   Current Outpatient Medications  Medication Sig Dispense Refill   acetaminophen (TYLENOL) 325 MG tablet Take 650 mg by mouth every 6 (six) hours as needed.     albuterol (PROVENTIL HFA;VENTOLIN HFA) 108 (90 Base) MCG/ACT inhaler Inhale 2 puffs into the lungs every 6 (six) hours as needed for wheezing or shortness of breath. 1 Inhaler 2   apixaban (ELIQUIS) 5 MG TABS tablet Take 1 tablet (5 mg total) by mouth 2 (two) times daily. 60 tablet 12   atorvastatin (LIPITOR) 10 MG tablet Take 10 mg by mouth at bedtime.     cetirizine (ZYRTEC) 10 MG tablet Take 10 mg by mouth daily as needed for allergies.     docusate sodium (COLACE) 100 MG capsule Take 100 mg by mouth daily.     DULoxetine (CYMBALTA) 60 MG capsule Take 1 capsule (60 mg total) by mouth daily. 30 capsule 3   ferrous sulfate 325 (65 FE) MG tablet Take 325 mg by mouth daily.      folic acid (FOLVITE) 1 MG tablet Take 1 mg by mouth daily.     gabapentin (NEURONTIN) 300 MG capsule TAKE 3 CAPSULES BY MOUTH IN THE  MORNING AND 3 CAPSULES BY MOUTH  IN THE EVENING 450 capsule 3   hydroxychloroquine (PLAQUENIL) 200 MG tablet Take 200 mg by mouth daily.     JARDIANCE 10 MG TABS tablet Take 10 mg by mouth every morning.     lisinopril (ZESTRIL) 10 MG tablet Take 1 tablet (10 mg total) by mouth daily. (Patient taking differently: Take 10 mg by mouth every morning.) 30 tablet 1   methotrexate (RHEUMATREX) 2.5 MG tablet Take 20 mg by mouth once a week. Has not yet  started as of 02/23/22     Multiple Vitamins-Minerals (WOMENS MULTIVITAMIN PO) Take 1 tablet by mouth daily.     ondansetron (ZOFRAN-ODT) 4 MG disintegrating tablet Take 1 tablet (4 mg total) by mouth every 8 (eight) hours as needed for nausea or vomiting. 20 tablet 0   ondansetron (ZOFRAN-ODT) 4 MG disintegrating tablet Take 1 tablet (4 mg total) by mouth every 6 (six) hours as needed for nausea or vomiting. 20 tablet 0   pantoprazole (PROTONIX) 40 MG tablet Take 1 tablet (40 mg total) by mouth daily. (Patient taking differently: Take 40 mg by mouth every morning.) 30 tablet 11   sitaGLIPtin (JANUVIA) 100 MG tablet Take 100 mg by mouth every morning.     VICTOZA 18 MG/3ML SOPN Inject into the skin.     vitamin B-12 (CYANOCOBALAMIN) 500 MCG tablet Take 1 tablet (500 mcg total) by mouth daily. 90 tablet 1   No current facility-administered medications for this visit.    Past Medical History:  Diagnosis Date   Anemia of chronic disease 05/16/2019   Anxiety    h/o   Arthritis    Cocaine abuse    COPD (chronic obstructive pulmonary disease)    Depression  Diabetes mellitus without complication    DKA (diabetic ketoacidosis)    Elevated troponin    GERD (gastroesophageal reflux disease)    GI bleed    Hypertension    bp under control-off meds since 2019   Ischemic leg     Past Surgical History:  Procedure Laterality Date   ABDOMINAL HYSTERECTOMY     AMPUTATION Right 12/27/2018   Procedure: AMPUTATION BELOW KNEE;  Surgeon: Annice Needy, MD;  Location: [facility];  Service: General;  Laterality: Right;   AMPUTATION Left 11/05/2021   Procedure: AMPUTATION BELOW KNEE;  Surgeon: Annice Needy, MD;  Location: [facility];  Service: Vascular;  Laterality: Left;   APPLICATION OF WOUND VAC  10/16/2021   Procedure: APPLICATION OF WOUND VAC;  Surgeon: Annice Needy, MD;  Location: [facility];  Service: Vascular;;   CATARACT EXTRACTION W/PHACO Right 03/27/2020   Procedure: CATARACT EXTRACTION PHACO AND  INTRAOCULAR LENS PLACEMENT (IOC) RIGHT DIABETIC 7.54 00:52.5;  Surgeon: Galen Manila, MD;  Location: [facility];  Service: Ophthalmology;  Laterality: Right;   CATARACT EXTRACTION W/PHACO Left 05/20/2020   Procedure: CATARACT EXTRACTION PHACO AND INTRAOCULAR LENS PLACEMENT (IOC) LEFT DIABETIC 4.95 00:37.6;  Surgeon: Galen Manila, MD;  Location: [facility];  Service: Ophthalmology;  Laterality: Left;  Diabetic - oral meds COVID + 04-24-20   CHOLECYSTECTOMY     COLONOSCOPY WITH PROPOFOL N/A 04/16/2019   Procedure: COLONOSCOPY WITH PROPOFOL;  Surgeon: Pasty Spillers, MD;  Location: [facility];  Service: Endoscopy;  Laterality: N/A;   COLONOSCOPY WITH PROPOFOL N/A 01/16/2020   Procedure: COLONOSCOPY WITH PROPOFOL;  Surgeon: Pasty Spillers, MD;  Location: [facility];  Service: Endoscopy;  Laterality: N/A;   ENDARTERECTOMY FEMORAL Left 10/16/2021   Procedure: ENDARTERECTOMY FEMORAL;  Surgeon: Annice Needy, MD;  Location: [facility];  Service: Vascular;  Laterality: Left;   ESOPHAGOGASTRODUODENOSCOPY (EGD) WITH PROPOFOL N/A 06/15/2018   Procedure: ESOPHAGOGASTRODUODENOSCOPY (EGD) WITH PROPOFOL;  Surgeon: Toledo, Boykin Nearing, MD;  Location: [facility];  Service: Gastroenterology;  Laterality: N/A;   ESOPHAGOGASTRODUODENOSCOPY (EGD) WITH PROPOFOL N/A 01/03/2019   Procedure: ESOPHAGOGASTRODUODENOSCOPY (EGD) WITH PROPOFOL;  Surgeon: Midge Minium, MD;  Location: [facility];  Service: Endoscopy;  Laterality: N/A;   ESOPHAGOGASTRODUODENOSCOPY (EGD) WITH PROPOFOL N/A 09/25/2021   Procedure: ESOPHAGOGASTRODUODENOSCOPY (EGD) WITH PROPOFOL;  Surgeon: Midge Minium, MD;  Location: [facility];  Service: Endoscopy;  Laterality: N/A;   GIVENS CAPSULE STUDY  04/16/2019   Procedure: GIVENS CAPSULE STUDY;  Surgeon: Pasty Spillers, MD;  Location: [facility];  Service: Endoscopy;;   GIVENS CAPSULE STUDY N/A 06/25/2019   Procedure: GIVENS CAPSULE STUDY;  Surgeon: Wyline Mood, MD;  Location: [facility];  Service: Gastroenterology;  Laterality: N/A;   INCISION AND DRAINAGE OF WOUND Left 02/24/2022   Procedure: LEFT BKA IRRIGATION AND DEBRIDEMENT WOUND;  Surgeon: Annice Needy, MD;  Location: [facility];  Service: Vascular;  Laterality: Left;   LOWER EXTREMITY ANGIOGRAPHY Right 08/21/2018   Procedure: LOWER EXTREMITY ANGIOGRAPHY;  Surgeon: Annice Needy, MD;  Location: [facility];  Service: Cardiovascular;  Laterality: Right;   LOWER EXTREMITY ANGIOGRAPHY Left 08/28/2018   Procedure: LOWER EXTREMITY ANGIOGRAPHY;  Surgeon: Annice Needy, MD;  Location: [facility];  Service: Cardiovascular;  Laterality: Left;   LOWER EXTREMITY ANGIOGRAPHY Right 08/28/2018   Procedure: Lower Extremity Angiography;  Surgeon: Annice Needy, MD;  Location: [facility];  Service: Cardiovascular;  Laterality: Right;   LOWER EXTREMITY ANGIOGRAPHY Right 12/18/2018  Procedure: Lower Extremity Angiography;  Surgeon: Annice Needy, MD;  Location: [facility];  Service: Cardiovascular;  Laterality: Right;   LOWER EXTREMITY ANGIOGRAPHY Right 12/18/2018   Procedure: Lower Extremity Angiography;  Surgeon: Annice Needy, MD;  Location: [facility];  Service: Cardiovascular;  Laterality: Right;   LOWER EXTREMITY ANGIOGRAPHY Left 12/21/2018   Procedure: Lower Extremity Angiography;  Surgeon: Annice Needy, MD;  Location: [facility];  Service: Cardiovascular;  Laterality: Left;   LOWER EXTREMITY ANGIOGRAPHY Right 12/21/2018   Procedure: Lower Extremity Angiography;  Surgeon: Annice Needy, MD;  Location: [facility];  Service: Cardiovascular;  Laterality: Right;   LOWER EXTREMITY ANGIOGRAPHY Left 12/25/2020   Procedure: LOWER EXTREMITY ANGIOGRAPHY;  Surgeon: Annice Needy, MD;  Location: [facility];  Service: Cardiovascular;  Laterality: Left;   LOWER EXTREMITY ANGIOGRAPHY Left 09/21/2021   Procedure: Lower Extremity Angiography;  Surgeon: Annice Needy, MD;  Location: [facility];  Service: Cardiovascular;  Laterality: Left;   LOWER EXTREMITY ANGIOGRAPHY Left 10/13/2021   Procedure: Lower Extremity Angiography;  Surgeon: Renford Dills, MD;  Location: [facility];  Service: Cardiovascular;  Laterality: Left;   LOWER EXTREMITY ANGIOGRAPHY Left 10/14/2021   Procedure: Lower Extremity Angiography;  Surgeon: Annice Needy, MD;  Location: [facility];  Service: Cardiovascular;  Laterality: Left;   LOWER EXTREMITY ANGIOGRAPHY Left 11/02/2021   Procedure: Lower Extremity Angiography;  Surgeon: Annice Needy, MD;  Location: [facility];  Service: Cardiovascular;  Laterality: Left;   LOWER EXTREMITY INTERVENTION N/A 12/22/2018   Procedure: LOWER EXTREMITY INTERVENTION;  Surgeon: Annice Needy, MD;  Location: [facility];  Service: Cardiovascular;  Laterality: N/A;   LOWER EXTREMITY INTERVENTION Left 12/26/2020   Procedure: LOWER EXTREMITY INTERVENTION;  Surgeon: Annice Needy, MD;  Location: [facility];  Service: Cardiovascular;  Laterality: Left;   LOWER EXTREMITY INTERVENTION N/A 09/22/2021   Procedure: LOWER EXTREMITY INTERVENTION;  Surgeon: Annice Needy, MD;  Location: [facility];  Service: Cardiovascular;  Laterality: N/A;     Social History   Tobacco Use   Smoking status: Former    Packs/day: 0.25    Years: 45.00    Additional pack years: 0.00    Total pack years: 11.25    Types: Cigarettes   Smokeless tobacco: Never   Tobacco comments:    quit  Vaping Use   Vaping Use: Never used  Substance Use Topics   Alcohol use: No   Drug use: Not Currently    Types: Cocaine    Comment: last used in April 2021 per patient-last 2 drug tests have been negative for cocaine      Family History  Problem Relation Age of Onset   Breast cancer Mother 75     Allergies  Allergen Reactions   Vancomycin Rash    Patient developed a rash to injection site and arm a few mintes after  starting ABX.    Hydrocodone Rash   Metformin And Related Diarrhea   Penicillins Hives    Has patient had a PCN reaction causing immediate rash, facial/tongue/throat swelling, SOB or lightheadedness with hypotension: Yes Has patient had a PCN reaction causing severe rash involving mucus membranes or skin necrosis: No Has patient had a PCN reaction that required hospitalization: No Has patient had a PCN reaction occurring within the last 10 years: No If all of the above answers are "  NO", then may proceed with Cephalosporin use.   Tramadol Itching    REVIEW OF SYSTEMS (Negative unless checked)   Constitutional: [] Weight loss  [] Fever  [] Chills Cardiac: [] Chest pain   [] Chest pressure   [] Palpitations   [] Shortness of breath when laying flat   [] Shortness of breath at rest   [x] Shortness of breath with exertion. Vascular:  [] Pain in legs with walking   [] Pain in legs at rest   [] Pain in legs when laying flat   [] Claudication   [] Pain in feet when walking  [] Pain in feet at rest  [] Pain in feet when laying flat   [] History of DVT   [] Phlebitis   [] Swelling in legs   [] Varicose veins   [] Non-healing ulcers Pulmonary:   [] Uses home oxygen   [] Productive cough   [] Hemoptysis   [] Wheeze  [x] COPD   [] Asthma Neurologic:  [] Dizziness  [] Blackouts   [] Seizures   [] History of stroke   [] History of TIA  [] Aphasia   [] Temporary blindness   [] Dysphagia   [] Weakness or numbness in arms   [] Weakness or numbness in legs Musculoskeletal:  [x] Arthritis   [] Joint swelling   [] Joint pain   [] Low back pain Hematologic:  [] Easy bruising  [] Easy bleeding   [] Hypercoagulable state   [] Anemic   Gastrointestinal:  [] Blood in stool   [] Vomiting blood  [x] Gastroesophageal reflux/heartburn   [] Abdominal pain Genitourinary:  [] Chronic kidney disease   [] Difficult urination  [] Frequent urination  [] Burning with urination   [] Hematuria Skin:  [] Rashes   [x] Ulcers   [x] Wounds Psychological:  [] History of anxiety   []  History of  major depression.  Physical Examination  BP (!) 172/78 (BP Location: Right Arm)   Pulse 96   Resp 17  Gen:  WD/WN, NAD Head: [place]/AT, No temporalis wasting. Ear/Nose/Throat: Hearing grossly intact, nares w/o erythema or drainage Eyes: Conjunctiva clear. Sclera non-icteric Neck: Supple.  Trachea midline Pulmonary:  Good air movement, no use of accessory muscles.  Cardiac: RRR, no JVD Vascular:  Vessel Right Left  Radial Palpable Palpable                          PT Not Palpable Not Palpable  DP Not Palpable Not Palpable   Gastrointestinal: soft, non-tender/non-distended. No guarding/reflex.  Musculoskeletal: M/S 5/5 throughout.  Right BKA well-healed.  Left BKA with contracture present small superficial wound on the medial aspect of the incision.  No erythema, small amount of fibrinous exudate. Neurologic: Sensation grossly intact in extremities.  Symmetrical.  Speech is fluent.  Psychiatric: Judgment intact, Mood & affect appropriate for pt's clinical situation. Dermatologic: Left bka wound as above    Labs Recent Results (from the past 2160 hour(s))  Ferritin     Status: None   Collection Time: 06/04/22  9:59 AM  Result Value Ref Range   Ferritin 72 11 - 307 ng/mL    Comment: Performed at [facility], [address] [place], [place] [address]  Iron and TIBC     Status: None   Collection Time: 06/04/22  9:59 AM  Result Value Ref Range   Iron 51 28 - 170 ug/dL   TIBC 696 295 - 284 ug/dL   Saturation Ratios 16 10.4 - 31.8 %   UIBC 264 ug/dL    Comment: Performed at [facility], [address] [place], [place] [address]  CBC with Differential/Platelet     Status: Abnormal   Collection Time: 06/04/22  9:59 AM  Result Value Ref Range   WBC 9.8 4.0 - 10.5 K/uL   RBC 3.76 (L) 3.87 - 5.11 MIL/uL   Hemoglobin 10.5 (L) 12.0 - 15.0 g/dL   HCT 60.7 (L) 37.1 - 06.2 %   MCV 82.2 80.0 - 100.0 fL   MCH 27.9 26.0 - 34.0 pg   MCHC 34.0 30.0 - 36.0 g/dL    RDW 69.4 85.4 - 62.7 %   Platelets 392 150 - 400 K/uL   nRBC 0.0 0.0 - 0.2 %   Neutrophils Relative % 60 %   Neutro Abs 5.9 1.7 - 7.7 K/uL   Lymphocytes Relative 27 %   Lymphs Abs 2.7 0.7 - 4.0 K/uL   Monocytes Relative 8 %   Monocytes Absolute 0.7 0.1 - 1.0 K/uL   Eosinophils Relative 4 %   Eosinophils Absolute 0.4 0.0 - 0.5 K/uL   Basophils Relative 1 %   Basophils Absolute 0.1 0.0 - 0.1 K/uL   Immature Granulocytes 0 %   Abs Immature Granulocytes 0.03 0.00 - 0.07 K/uL    Comment: Performed at [facility], [address], [place], [place] [address]    Radiology MM 3D SCREEN BREAST BILATERAL  Result Date: 07/29/2022 CLINICAL DATA:  Screening. Technologist indicates best positioning/images possible. EXAM: DIGITAL SCREENING BILATERAL MAMMOGRAM WITH TOMOSYNTHESIS AND CAD TECHNIQUE: Bilateral screening digital craniocaudal and mediolateral oblique mammograms were obtained. Bilateral screening digital breast tomosynthesis was performed. The images were evaluated with computer-aided detection. COMPARISON:  Previous exam(s). ACR Breast Density Category b: There are scattered areas of fibroglandular density. FINDINGS: There are no findings suspicious for malignancy. IMPRESSION: No mammographic evidence of malignancy. A result letter of this screening mammogram will be mailed directly to the patient. RECOMMENDATION: Screening mammogram in one year. (Code:SM-B-01Y) BI-RADS CATEGORY  1: Negative. Electronically Signed   By: Sherron Ales M.D.   On: 07/29/2022 09:04    Assessment/Plan  PAD (peripheral artery disease) (HCC) Now status post bilateral BKA's.  Has terrible neuropathic pain in both lower extremities.  Her left BKA has a contracture of the small wound which would make prosthesis extremely difficult.  I recommended she go see a pain specialist for management of her chronic pain at this point.  Consideration for spinal cord stimulator other treatment would be reasonable.  Follow-up in  6 months.  Essential hypertension blood pressure control important in reducing the progression of atherosclerotic disease. On appropriate oral medications.   Type 2 diabetes mellitus with peripheral neuropathy (HCC) blood glucose control important in reducing the progression of atherosclerotic disease. Also, involved in wound healing. On appropriate medications.  Likely contributing to neuropathic pain in the lower extremities.    [name], MD  08/10/2022 1:20 PM    This note was created with Dragon medical transcription system.  Any errors from dictation are purely unintentional

## 2022-08-10 NOTE — Assessment & Plan Note (Signed)
blood glucose control important in reducing the progression of atherosclerotic disease. Also, involved in wound healing. On appropriate medications.  Likely contributing to neuropathic pain in the lower extremities.

## 2022-08-10 NOTE — Assessment & Plan Note (Signed)
Now status post bilateral BKA's.  Has terrible neuropathic pain in both lower extremities.  Her left BKA has a contracture of the small wound which would make prosthesis extremely difficult.  I recommended she go see a pain specialist for management of her chronic pain at this point.  Consideration for spinal cord stimulator other treatment would be reasonable.  Follow-up in 6 months.

## 2022-08-10 NOTE — Assessment & Plan Note (Signed)
blood pressure control important in reducing the progression of atherosclerotic disease. On appropriate oral medications.  

## 2022-09-27 ENCOUNTER — Telehealth (INDEPENDENT_AMBULATORY_CARE_PROVIDER_SITE_OTHER): Payer: Self-pay

## 2022-09-27 NOTE — Telephone Encounter (Signed)
She can see me or dew sometime this week

## 2022-09-27 NOTE — Telephone Encounter (Signed)
Will have front desk to call pt and make an appt.

## 2022-09-30 ENCOUNTER — Ambulatory Visit (INDEPENDENT_AMBULATORY_CARE_PROVIDER_SITE_OTHER): Payer: 59 | Admitting: Nurse Practitioner

## 2022-10-01 ENCOUNTER — Other Ambulatory Visit (INDEPENDENT_AMBULATORY_CARE_PROVIDER_SITE_OTHER): Payer: Self-pay | Admitting: Nurse Practitioner

## 2022-10-01 ENCOUNTER — Ambulatory Visit (INDEPENDENT_AMBULATORY_CARE_PROVIDER_SITE_OTHER): Payer: 59 | Admitting: Nurse Practitioner

## 2022-10-01 ENCOUNTER — Encounter (INDEPENDENT_AMBULATORY_CARE_PROVIDER_SITE_OTHER): Payer: Self-pay | Admitting: Nurse Practitioner

## 2022-10-01 VITALS — BP 157/82 | HR 95 | Resp 16

## 2022-10-01 DIAGNOSIS — Z89512 Acquired absence of left leg below knee: Secondary | ICD-10-CM | POA: Diagnosis not present

## 2022-10-01 DIAGNOSIS — I10 Essential (primary) hypertension: Secondary | ICD-10-CM | POA: Diagnosis not present

## 2022-10-01 MED ORDER — CIPROFLOXACIN HCL 500 MG PO TABS: 500.0000 mg | ORAL_TABLET | Freq: Two times a day (BID) | ORAL | 0 refills | Status: DC

## 2022-10-01 NOTE — Progress Notes (Signed)
Subjective:    Patient ID: Sylvia [name], female    DOB: April 07, 1956, 67 y.o.   MRN: [id number] Chief Complaint  Patient presents with   Follow-up    Leaking and swelling with left leg    The patient presents today for evaluation of her left below-knee amputation site.  She notes that she fell and has been having some drainage from the incision line.  She also notes that there is some knotty hard tissue on her inner thigh area.  She notes that there is some pain and discomfort to this area.  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  This has been known since shortly after her surgery due to her issues with mobility immediately after.  Today with a point-of-care ultrasound machine be evaluated to see the if the patient had a superficial phlebitis and none was found.    Review of Systems  Musculoskeletal:  Positive for joint swelling and myalgias.  Skin:  Positive for wound.  Neurological:  Positive for weakness.  All other systems reviewed and are negative.      Objective:   Physical Exam Vitals reviewed.  HENT:     Head: Normocephalic.  Cardiovascular:     Rate and Rhythm: Normal rate.  Pulmonary:     Effort: Pulmonary effort is normal.  Musculoskeletal:     Right Lower Extremity: Right leg is amputated below knee.     Left Lower Extremity: Left leg is amputated below knee.  Skin:    General: Skin is warm and dry.  Neurological:     Mental Status: She is alert and oriented to person, place, and time.  Psychiatric:        Mood and Affect: Mood normal.        Behavior: Behavior normal.        Thought Content: Thought content normal.        Judgment: Judgment normal.     BP (!) 157/82 (BP Location: Right Arm)   Pulse 95   Resp 16   Past Medical History:  Diagnosis Date   Anemia of chronic disease 05/16/2019   Anxiety    h/o   Arthritis    Cocaine abuse (HCC)    COPD (chronic obstructive pulmonary disease) (HCC)    Depression    Diabetes  mellitus without complication (HCC)    DKA (diabetic ketoacidosis) (HCC)    Elevated troponin    GERD (gastroesophageal reflux disease)    GI bleed    Hypertension    bp under control-off meds since 2019   Ischemic leg     Social History   Socioeconomic History   Marital status: Divorced    Spouse name: Not on file   Number of children: 2   Years of education: Not on file   Highest education level: Not on file  Occupational History   Not on file  Tobacco Use   Smoking status: Former    Packs/day: 0.25    Years: 45.00    Additional pack years: 0.00    Total pack years: 11.25    Types: Cigarettes   Smokeless tobacco: Never   Tobacco comments:    quit  Vaping Use   Vaping Use: Never used  Substance and Sexual Activity   Alcohol use: No   Drug use: Not Currently    Types: Cocaine    Comment: last used in April 2021 per patient-last 2 drug tests have been negative for cocaine   Sexual activity: Not Currently  Other Topics Concern   Not on file  Social History Narrative   ** Merged History Encounter **       5 grandchildren and 5 great grandchildren  2 grandchildren live with Pt. (25 & 77 y.o.)   Social Determinants of Health   Financial Resource Strain: Low Risk  (06/14/2018)   Overall Financial Resource Strain (CARDIA)    Difficulty of Paying Living Expenses: Not hard at all  Food Insecurity: No Food Insecurity (02/23/2022)   Hunger Vital Sign    Worried About Running Out of Food in the Last Year: Never true    Ran Out of Food in the Last Year: Never true  Transportation Needs: No Transportation Needs (02/23/2022)   PRAPARE - Administrator, Civil Service (Medical): No    Lack of Transportation (Non-Medical): No  Physical Activity: Insufficiently Active (06/14/2018)   Exercise Vital Sign    Days of Exercise per Week: 4 days    Minutes of Exercise per Session: 20 min  Stress: No Stress Concern Present (06/14/2018)   [employer] of Occupational  Health - Occupational Stress Questionnaire    Feeling of Stress : Only a little  Social Connections: Unknown (06/14/2018)   Social Connection and Isolation Panel [NHANES]    Frequency of Communication with Friends and Family: Patient declined    Frequency of Social Gatherings with Friends and Family: Patient declined    Attends Religious Services: Patient declined    Active Member of Clubs or Organizations: Patient declined    Attends Banker Meetings: Patient declined    Marital Status: Patient declined  Intimate Partner Violence: Not At Risk (02/23/2022)   Humiliation, Afraid, Rape, and Kick questionnaire    Fear of Current or Ex-Partner: No    Emotionally Abused: No    Physically Abused: No    Sexually Abused: No    Past Surgical History:  Procedure Laterality Date   ABDOMINAL HYSTERECTOMY     AMPUTATION Right 12/27/2018   Procedure: AMPUTATION BELOW KNEE;  Surgeon: Annice Needy, MD;  Location: [facility];  Service: General;  Laterality: Right;   AMPUTATION Left 11/05/2021   Procedure: AMPUTATION BELOW KNEE;  Surgeon: Annice Needy, MD;  Location: [facility];  Service: Vascular;  Laterality: Left;   APPLICATION OF WOUND VAC  10/16/2021   Procedure: APPLICATION OF WOUND VAC;  Surgeon: Annice Needy, MD;  Location: [facility];  Service: Vascular;;   CATARACT EXTRACTION W/PHACO Right 03/27/2020   Procedure: CATARACT EXTRACTION PHACO AND INTRAOCULAR LENS PLACEMENT (IOC) RIGHT DIABETIC 7.54 00:52.5;  Surgeon: Galen Manila, MD;  Location: [facility];  Service: Ophthalmology;  Laterality: Right;   CATARACT EXTRACTION W/PHACO Left 05/20/2020   Procedure: CATARACT EXTRACTION PHACO AND INTRAOCULAR LENS PLACEMENT (IOC) LEFT DIABETIC 4.95 00:37.6;  Surgeon: Galen Manila, MD;  Location: [facility];  Service: Ophthalmology;  Laterality: Left;  Diabetic - oral meds COVID + 04-24-20   CHOLECYSTECTOMY     COLONOSCOPY WITH PROPOFOL N/A 04/16/2019   Procedure: COLONOSCOPY WITH  PROPOFOL;  Surgeon: Pasty Spillers, MD;  Location: [facility];  Service: Endoscopy;  Laterality: N/A;   COLONOSCOPY WITH PROPOFOL N/A 01/16/2020   Procedure: COLONOSCOPY WITH PROPOFOL;  Surgeon: Pasty Spillers, MD;  Location: [facility];  Service: Endoscopy;  Laterality: N/A;   ENDARTERECTOMY FEMORAL Left 10/16/2021   Procedure: ENDARTERECTOMY FEMORAL;  Surgeon: Annice Needy, MD;  Location: [facility];  Service: Vascular;  Laterality: Left;   ESOPHAGOGASTRODUODENOSCOPY (EGD) WITH PROPOFOL N/A  06/15/2018   Procedure: ESOPHAGOGASTRODUODENOSCOPY (EGD) WITH PROPOFOL;  Surgeon: Toledo, Boykin Nearing, MD;  Location: [facility];  Service: Gastroenterology;  Laterality: N/A;   ESOPHAGOGASTRODUODENOSCOPY (EGD) WITH PROPOFOL N/A 01/03/2019   Procedure: ESOPHAGOGASTRODUODENOSCOPY (EGD) WITH PROPOFOL;  Surgeon: Midge Minium, MD;  Location: [facility];  Service: Endoscopy;  Laterality: N/A;   ESOPHAGOGASTRODUODENOSCOPY (EGD) WITH PROPOFOL N/A 09/25/2021   Procedure: ESOPHAGOGASTRODUODENOSCOPY (EGD) WITH PROPOFOL;  Surgeon: Midge Minium, MD;  Location: [facility];  Service: Endoscopy;  Laterality: N/A;   GIVENS CAPSULE STUDY  04/16/2019   Procedure: GIVENS CAPSULE STUDY;  Surgeon: Pasty Spillers, MD;  Location: [facility];  Service: Endoscopy;;   GIVENS CAPSULE STUDY N/A 06/25/2019   Procedure: GIVENS CAPSULE STUDY;  Surgeon: Wyline Mood, MD;  Location: [facility];  Service: Gastroenterology;  Laterality: N/A;   INCISION AND DRAINAGE OF WOUND Left 02/24/2022   Procedure: LEFT BKA IRRIGATION AND DEBRIDEMENT WOUND;  Surgeon: Annice Needy, MD;  Location: [facility];  Service: Vascular;  Laterality: Left;   LOWER EXTREMITY ANGIOGRAPHY Right 08/21/2018   Procedure: LOWER EXTREMITY ANGIOGRAPHY;  Surgeon: Annice Needy, MD;  Location: [facility];  Service: Cardiovascular;  Laterality: Right;   LOWER EXTREMITY ANGIOGRAPHY Left 08/28/2018   Procedure: LOWER EXTREMITY ANGIOGRAPHY;   Surgeon: Annice Needy, MD;  Location: [facility];  Service: Cardiovascular;  Laterality: Left;   LOWER EXTREMITY ANGIOGRAPHY Right 08/28/2018   Procedure: Lower Extremity Angiography;  Surgeon: Annice Needy, MD;  Location: [facility];  Service: Cardiovascular;  Laterality: Right;   LOWER EXTREMITY ANGIOGRAPHY Right 12/18/2018   Procedure: Lower Extremity Angiography;  Surgeon: Annice Needy, MD;  Location: [facility];  Service: Cardiovascular;  Laterality: Right;   LOWER EXTREMITY ANGIOGRAPHY Right 12/18/2018   Procedure: Lower Extremity Angiography;  Surgeon: Annice Needy, MD;  Location: [facility];  Service: Cardiovascular;  Laterality: Right;   LOWER EXTREMITY ANGIOGRAPHY Left 12/21/2018   Procedure: Lower Extremity Angiography;  Surgeon: Annice Needy, MD;  Location: [facility];  Service: Cardiovascular;  Laterality: Left;   LOWER EXTREMITY ANGIOGRAPHY Right 12/21/2018   Procedure: Lower Extremity Angiography;  Surgeon: Annice Needy, MD;  Location: [facility];  Service: Cardiovascular;  Laterality: Right;   LOWER EXTREMITY ANGIOGRAPHY Left 12/25/2020   Procedure: LOWER EXTREMITY ANGIOGRAPHY;  Surgeon: Annice Needy, MD;  Location: [facility];  Service: Cardiovascular;  Laterality: Left;   LOWER EXTREMITY ANGIOGRAPHY Left 09/21/2021   Procedure: Lower Extremity Angiography;  Surgeon: Annice Needy, MD;  Location: [facility];  Service: Cardiovascular;  Laterality: Left;   LOWER EXTREMITY ANGIOGRAPHY Left 10/13/2021   Procedure: Lower Extremity Angiography;  Surgeon: Renford Dills, MD;  Location: [facility];  Service: Cardiovascular;  Laterality: Left;   LOWER EXTREMITY ANGIOGRAPHY Left 10/14/2021   Procedure: Lower Extremity Angiography;  Surgeon: Annice Needy, MD;  Location: [facility];  Service: Cardiovascular;  Laterality: Left;   LOWER EXTREMITY ANGIOGRAPHY Left 11/02/2021   Procedure: Lower Extremity  Angiography;  Surgeon: Annice Needy, MD;  Location: [facility];  Service: Cardiovascular;  Laterality: Left;   LOWER EXTREMITY INTERVENTION N/A 12/22/2018   Procedure: LOWER EXTREMITY INTERVENTION;  Surgeon: Annice Needy, MD;  Location: [facility];  Service: Cardiovascular;  Laterality: N/A;   LOWER EXTREMITY INTERVENTION Left 12/26/2020   Procedure: LOWER EXTREMITY INTERVENTION;  Surgeon: Annice Needy, MD;  [facility]: [facility];  Service: Cardiovascular;  Laterality: Left;   LOWER EXTREMITY INTERVENTION N/A 09/22/2021   Procedure: LOWER EXTREMITY INTERVENTION;  Surgeon: Annice Needy, MD;  Location: [facility];  Service: Cardiovascular;  Laterality: N/A;    Family History  Problem Relation Age of Onset   Breast cancer Mother 52    Allergies  Allergen Reactions   Vancomycin Rash    Patient developed a rash to injection site and arm a few mintes after starting ABX.    Hydrocodone Rash   Metformin And Related Diarrhea   Penicillins Hives    Has patient had a PCN reaction causing immediate rash, facial/tongue/throat swelling, SOB or lightheadedness with hypotension: Yes Has patient had a PCN reaction causing severe rash involving mucus membranes or skin necrosis: No Has patient had a PCN reaction that required hospitalization: No Has patient had a PCN reaction occurring within the last 10 years: No If all of the above answers are "NO", then may proceed with Cephalosporin use.   Tramadol Itching       Latest Ref Rng & Units 06/04/2022    9:59 AM 02/25/2022    4:18 AM 02/23/2022    3:45 AM  CBC  WBC 4.0 - 10.5 K/uL 9.8  8.7  10.3   Hemoglobin 12.0 - 15.0 g/dL 16.1  9.7  09.6   Hematocrit 36.0 - 46.0 % 30.9  28.1  30.5   Platelets 150 - 400 K/uL 392  415  521       CMP     Component Value Date/Time   NA 137 02/25/2022 0418   K 4.2 02/25/2022 0418   CL 107 02/25/2022 0418   CO2 21 (L) 02/25/2022 0418   GLUCOSE 211 (H) 02/25/2022 0418   BUN 24  (H) 02/25/2022 0418   CREATININE 1.17 (H) 02/25/2022 0418   CALCIUM 9.8 02/25/2022 0418   PROT 8.4 (H) 11/30/2021 1050   ALBUMIN 3.5 11/30/2021 1050   AST 14 (L) 11/30/2021 1050   ALT 11 11/30/2021 1050   ALKPHOS 123 11/30/2021 1050   BILITOT 0.3 11/30/2021 1050   GFRNONAA 51 (L) 02/25/2022 0418     No results found.     Assessment & Plan:   1. Hx of BKA, left (HCC) I suspect that the pain in the discomfort she is having along the underlying is related to muscle atrophy due to her below-knee amputation being contracted.  Ultrasound shows no evidence of superficial phlebitis in the area.  It is also possible that this is inflamed from possible infection due to the drainage coming from her wound site.  I have prescribed some antibiotics today.  Will have her return in 2 weeks to see if this is caused any improvement if not we may discuss revision of her amputation site.  2. Essential hypertension Continue antihypertensive medications as already ordered, these medications have been reviewed and there are no changes at this time.   Current Outpatient Medications on File Prior to Visit  Medication Sig Dispense Refill   acetaminophen (TYLENOL) 325 MG tablet Take 650 mg by mouth every 6 (six) hours as needed.     albuterol (PROVENTIL HFA;VENTOLIN HFA) 108 (90 Base) MCG/ACT inhaler Inhale 2 puffs into the lungs every 6 (six) hours as needed for wheezing or shortness of breath. 1 Inhaler 2   apixaban (ELIQUIS) 5 MG TABS tablet Take 1 tablet (5 mg total) by mouth 2 (two) times daily. 60 tablet 12   atorvastatin (LIPITOR) 10 MG tablet  Take 10 mg by mouth at bedtime.     cetirizine (ZYRTEC) 10 MG tablet Take 10 mg by mouth daily as needed for allergies.     docusate sodium (COLACE) 100 MG capsule Take 100 mg by mouth daily.     DULoxetine (CYMBALTA) 60 MG capsule Take 1 capsule (60 mg total) by mouth daily. 30 capsule 3   ferrous sulfate 325 (65 FE) MG tablet Take 325 mg by mouth daily.       folic acid (FOLVITE) 1 MG tablet Take 1 mg by mouth daily.     gabapentin (NEURONTIN) 300 MG capsule TAKE 3 CAPSULES BY MOUTH IN THE  MORNING AND 3 CAPSULES BY MOUTH  IN THE EVENING 450 capsule 3   hydroxychloroquine (PLAQUENIL) 200 MG tablet Take 200 mg by mouth daily.     JARDIANCE 10 MG TABS tablet Take 10 mg by mouth every morning.     lisinopril (ZESTRIL) 10 MG tablet Take 1 tablet (10 mg total) by mouth daily. (Patient taking differently: Take 10 mg by mouth every morning.) 30 tablet 1   methotrexate (RHEUMATREX) 2.5 MG tablet Take 20 mg by mouth once a week. Has not yet started as of 02/23/22     Multiple Vitamins-Minerals (WOMENS MULTIVITAMIN PO) Take 1 tablet by mouth daily.     ondansetron (ZOFRAN-ODT) 4 MG disintegrating tablet Take 1 tablet (4 mg total) by mouth every 8 (eight) hours as needed for nausea or vomiting. 20 tablet 0   ondansetron (ZOFRAN-ODT) 4 MG disintegrating tablet Take 1 tablet (4 mg total) by mouth every 6 (six) hours as needed for nausea or vomiting. 20 tablet 0   pantoprazole (PROTONIX) 40 MG tablet Take 1 tablet (40 mg total) by mouth daily. (Patient taking differently: Take 40 mg by mouth every morning.) 30 tablet 11   sitaGLIPtin (JANUVIA) 100 MG tablet Take 100 mg by mouth every morning.     VICTOZA 18 MG/3ML SOPN Inject into the skin.     vitamin B-12 (CYANOCOBALAMIN) 500 MCG tablet Take 1 tablet (500 mcg total) by mouth daily. 90 tablet 1   No current facility-administered medications on file prior to visit.    There are no Patient Instructions on file for this visit. No follow-ups on file.   Georgiana Spinner, NP

## 2022-10-03 LAB — AEROBIC CULTURE

## 2022-10-06 LAB — AEROBIC CULTURE

## 2022-10-15 ENCOUNTER — Ambulatory Visit (INDEPENDENT_AMBULATORY_CARE_PROVIDER_SITE_OTHER): Payer: 59 | Admitting: Nurse Practitioner

## 2022-10-15 ENCOUNTER — Encounter (INDEPENDENT_AMBULATORY_CARE_PROVIDER_SITE_OTHER): Payer: Self-pay | Admitting: Nurse Practitioner

## 2022-10-15 VITALS — BP 169/76 | HR 96 | Resp 18 | Wt 131.0 lb

## 2022-10-15 DIAGNOSIS — Z89512 Acquired absence of left leg below knee: Secondary | ICD-10-CM | POA: Diagnosis not present

## 2022-10-15 DIAGNOSIS — I10 Essential (primary) hypertension: Secondary | ICD-10-CM

## 2022-10-15 DIAGNOSIS — E1142 Type 2 diabetes mellitus with diabetic polyneuropathy: Secondary | ICD-10-CM | POA: Diagnosis not present

## 2022-10-15 MED ORDER — OXYCODONE-ACETAMINOPHEN 5-325 MG PO TABS: 1.0000 | ORAL_TABLET | Freq: Four times a day (QID) | ORAL | 0 refills | Status: DC | PRN

## 2022-10-16 NOTE — H&P (View-Only) (Signed)
Subjective:    Patient ID: Sylvia [name], female    DOB: January 21, 1956, 67 y.o.   MRN: [id number] Chief Complaint  Patient presents with   Follow-up    Room 7- 2 week follow up no studies    Sylvia [name] is a a 67 year old female who presents today for wound reevaluation on her left below-knee amputation.  She notes that the drainage from the wound has stopped and the wound itself is scabbed over.  However she continues to have significant pain in her knee and upper leg.  Patient's knee is contracted and has been for several months.  The patient has significant pain and difficulty following her amputation and she ultimately ended up with a contracture.  Right below-knee amputation however currently does not have any contracture.    Review of Systems  Musculoskeletal:  Positive for gait problem and joint swelling.  All other systems reviewed and are negative.      Objective:   Physical Exam Vitals reviewed.  HENT:     Head: Normocephalic.  Cardiovascular:     Rate and Rhythm: Normal rate.  Pulmonary:     Effort: Pulmonary effort is normal.  Musculoskeletal:     Right Lower Extremity: Right leg is amputated below knee.     Left Lower Extremity: Left leg is amputated below knee.  Skin:    General: Skin is warm and dry.  Neurological:     Mental Status: She is alert and oriented to person, place, and time.  Psychiatric:        Mood and Affect: Mood normal.        Behavior: Behavior normal.        Thought Content: Thought content normal.        Judgment: Judgment normal.     BP (!) 169/76   Pulse 96   Resp 18   Wt 131 lb (59.4 kg)   BMI 20.52 kg/m   Past Medical History:  Diagnosis Date   Anemia of chronic disease 05/16/2019   Anxiety    h/o   Arthritis    Cocaine abuse (HCC)    COPD (chronic obstructive pulmonary disease) (HCC)    Depression    Diabetes mellitus without complication (HCC)    DKA (diabetic ketoacidosis) (HCC)    Elevated troponin     GERD (gastroesophageal reflux disease)    GI bleed    Hypertension    bp under control-off meds since 2019   Ischemic leg     Social History   Socioeconomic History   Marital status: Divorced    Spouse name: Not on file   Number of children: 2   Years of education: Not on file   Highest education level: Not on file  Occupational History   Not on file  Tobacco Use   Smoking status: Former    Packs/day: 0.25    Years: 45.00    Additional pack years: 0.00    Total pack years: 11.25    Types: Cigarettes   Smokeless tobacco: Never   Tobacco comments:    quit  Vaping Use   Vaping Use: Never used  Substance and Sexual Activity   Alcohol use: No   Drug use: Not Currently    Types: Cocaine    Comment: last used in April 2021 per patient-last 2 drug tests have been negative for cocaine   Sexual activity: Not Currently  Other Topics Concern   Not on file  Social History Narrative   ** Merged  History Encounter **       5 grandchildren and 5 great grandchildren  2 grandchildren live with Pt. (25 & 43 y.o.)   Social Determinants of Health   Financial Resource Strain: Low Risk  (06/14/2018)   Overall Financial Resource Strain (CARDIA)    Difficulty of Paying Living Expenses: Not hard at all  Food Insecurity: No Food Insecurity (02/23/2022)   Hunger Vital Sign    Worried About Running Out of Food in the Last Year: Never true    Ran Out of Food in the Last Year: Never true  Transportation Needs: No Transportation Needs (02/23/2022)   PRAPARE - Administrator, Civil Service (Medical): No    Lack of Transportation (Non-Medical): No  Physical Activity: Insufficiently Active (06/14/2018)   Exercise Vital Sign    Days of Exercise per Week: 4 days    Minutes of Exercise per Session: 20 min  Stress: No Stress Concern Present (06/14/2018)   [employer] of Occupational Health - Occupational Stress Questionnaire    Feeling of Stress : Only a little  Social Connections:  Unknown (06/14/2018)   Social Connection and Isolation Panel [NHANES]    Frequency of Communication with Friends and Family: Patient declined    Frequency of Social Gatherings with Friends and Family: Patient declined    Attends Religious Services: Patient declined    Active Member of Clubs or Organizations: Patient declined    Attends Banker Meetings: Patient declined    Marital Status: Patient declined  Intimate Partner Violence: Not At Risk (02/23/2022)   Humiliation, Afraid, Rape, and Kick questionnaire    Fear of Current or Ex-Partner: No    Emotionally Abused: No    Physically Abused: No    Sexually Abused: No    Past Surgical History:  Procedure Laterality Date   ABDOMINAL HYSTERECTOMY     AMPUTATION Right 12/27/2018   Procedure: AMPUTATION BELOW KNEE;  Surgeon: Annice Needy, MD;  Location: [facility];  Service: General;  Laterality: Right;   AMPUTATION Left 11/05/2021   Procedure: AMPUTATION BELOW KNEE;  Surgeon: Annice Needy, MD;  Location: [facility];  Service: Vascular;  Laterality: Left;   APPLICATION OF WOUND VAC  10/16/2021   Procedure: APPLICATION OF WOUND VAC;  Surgeon: Annice Needy, MD;  Location: [facility];  Service: Vascular;;   CATARACT EXTRACTION W/PHACO Right 03/27/2020   Procedure: CATARACT EXTRACTION PHACO AND INTRAOCULAR LENS PLACEMENT (IOC) RIGHT DIABETIC 7.54 00:52.5;  Surgeon: Galen Manila, MD;  Location: [facility];  Service: Ophthalmology;  Laterality: Right;   CATARACT EXTRACTION W/PHACO Left 05/20/2020   Procedure: CATARACT EXTRACTION PHACO AND INTRAOCULAR LENS PLACEMENT (IOC) LEFT DIABETIC 4.95 00:37.6;  Surgeon: Galen Manila, MD;  Location: [facility];  Service: Ophthalmology;  Laterality: Left;  Diabetic - oral meds COVID + 04-24-20   CHOLECYSTECTOMY     COLONOSCOPY WITH PROPOFOL N/A 04/16/2019   Procedure: COLONOSCOPY WITH PROPOFOL;  Surgeon: Pasty Spillers, MD;  Location: [facility];  Service: Endoscopy;   Laterality: N/A;   COLONOSCOPY WITH PROPOFOL N/A 01/16/2020   Procedure: COLONOSCOPY WITH PROPOFOL;  Surgeon: Pasty Spillers, MD;  Location: [facility];  Service: Endoscopy;  Laterality: N/A;   ENDARTERECTOMY FEMORAL Left 10/16/2021   Procedure: ENDARTERECTOMY FEMORAL;  Surgeon: Annice Needy, MD;  Location: [facility];  Service: Vascular;  Laterality: Left;   ESOPHAGOGASTRODUODENOSCOPY (EGD) WITH PROPOFOL N/A 06/15/2018   Procedure: ESOPHAGOGASTRODUODENOSCOPY (EGD) WITH PROPOFOL;  Surgeon: Norma Fredrickson, Boykin Nearing, MD;  Location:  [facility];  Service: Gastroenterology;  Laterality: N/A;   ESOPHAGOGASTRODUODENOSCOPY (EGD) WITH PROPOFOL N/A 01/03/2019   Procedure: ESOPHAGOGASTRODUODENOSCOPY (EGD) WITH PROPOFOL;  Surgeon: Midge Minium, MD;  Location: [facility];  Service: Endoscopy;  Laterality: N/A;   ESOPHAGOGASTRODUODENOSCOPY (EGD) WITH PROPOFOL N/A 09/25/2021   Procedure: ESOPHAGOGASTRODUODENOSCOPY (EGD) WITH PROPOFOL;  Surgeon: Midge Minium, MD;  Location: [facility];  Service: Endoscopy;  Laterality: N/A;   GIVENS CAPSULE STUDY  04/16/2019   Procedure: GIVENS CAPSULE STUDY;  Surgeon: Pasty Spillers, MD;  Location: [facility];  Service: Endoscopy;;   GIVENS CAPSULE STUDY N/A 06/25/2019   Procedure: GIVENS CAPSULE STUDY;  Surgeon: Wyline Mood, MD;  Location: [facility];  Service: Gastroenterology;  Laterality: N/A;   INCISION AND DRAINAGE OF WOUND Left 02/24/2022   Procedure: LEFT BKA IRRIGATION AND DEBRIDEMENT WOUND;  Surgeon: Annice Needy, MD;  Location: [facility];  Service: Vascular;  Laterality: Left;   LOWER EXTREMITY ANGIOGRAPHY Right 08/21/2018   Procedure: LOWER EXTREMITY ANGIOGRAPHY;  Surgeon: Annice Needy, MD;  Location: [facility];  Service: Cardiovascular;  Laterality: Right;   LOWER EXTREMITY ANGIOGRAPHY Left 08/28/2018   Procedure: LOWER EXTREMITY ANGIOGRAPHY;  Surgeon: Annice Needy, MD;  Location: [facility];  Service: Cardiovascular;  Laterality:  Left;   LOWER EXTREMITY ANGIOGRAPHY Right 08/28/2018   Procedure: Lower Extremity Angiography;  Surgeon: Annice Needy, MD;  Location: [facility];  Service: Cardiovascular;  Laterality: Right;   LOWER EXTREMITY ANGIOGRAPHY Right 12/18/2018   Procedure: Lower Extremity Angiography;  Surgeon: Annice Needy, MD;  Location: [facility];  Service: Cardiovascular;  Laterality: Right;   LOWER EXTREMITY ANGIOGRAPHY Right 12/18/2018   Procedure: Lower Extremity Angiography;  Surgeon: Annice Needy, MD;  Location: [facility];  Service: Cardiovascular;  Laterality: Right;   LOWER EXTREMITY ANGIOGRAPHY Left 12/21/2018   Procedure: Lower Extremity Angiography;  Surgeon: Annice Needy, MD;  Location: [facility];  Service: Cardiovascular;  Laterality: Left;   LOWER EXTREMITY ANGIOGRAPHY Right 12/21/2018   Procedure: Lower Extremity Angiography;  Surgeon: Annice Needy, MD;  Location: [facility];  Service: Cardiovascular;  Laterality: Right;   LOWER EXTREMITY ANGIOGRAPHY Left 12/25/2020   Procedure: LOWER EXTREMITY ANGIOGRAPHY;  Surgeon: Annice Needy, MD;  Location: [facility];  Service: Cardiovascular;  Laterality: Left;   LOWER EXTREMITY ANGIOGRAPHY Left 09/21/2021   Procedure: Lower Extremity Angiography;  Surgeon: Annice Needy, MD;  Location: [facility];  Service: Cardiovascular;  Laterality: Left;   LOWER EXTREMITY ANGIOGRAPHY Left 10/13/2021   Procedure: Lower Extremity Angiography;  Surgeon: Renford Dills, MD;  Location: [facility];  Service: Cardiovascular;  Laterality: Left;   LOWER EXTREMITY ANGIOGRAPHY Left 10/14/2021   Procedure: Lower Extremity Angiography;  Surgeon: Annice Needy, MD;  Location: [facility];  Service: Cardiovascular;  Laterality: Left;   LOWER EXTREMITY ANGIOGRAPHY Left 11/02/2021   Procedure: Lower Extremity Angiography;  Surgeon: Annice Needy, MD;  Location: [facility];  Service: Cardiovascular;   Laterality: Left;   LOWER EXTREMITY INTERVENTION N/A 12/22/2018   Procedure: LOWER EXTREMITY INTERVENTION;  Surgeon: Annice Needy, MD;  Location: [facility];  Service: Cardiovascular;  Laterality: N/A;   LOWER EXTREMITY INTERVENTION Left 12/26/2020   Procedure: LOWER EXTREMITY INTERVENTION;  Surgeon: Annice Needy, MD;  Location: [facility];  Service: Cardiovascular;  Laterality: Left;   LOWER EXTREMITY INTERVENTION N/A 09/22/2021  Procedure: LOWER EXTREMITY INTERVENTION;  Surgeon: Annice Needy, MD;  Location: [facility];  Service: Cardiovascular;  Laterality: N/A;    Family History  Problem Relation Age of Onset   Breast cancer Mother 62    Allergies  Allergen Reactions   Vancomycin Rash    Patient developed a rash to injection site and arm a few mintes after starting ABX.    Hydrocodone Rash   Metformin And Related Diarrhea   Penicillins Hives    Has patient had a PCN reaction causing immediate rash, facial/tongue/throat swelling, SOB or lightheadedness with hypotension: Yes Has patient had a PCN reaction causing severe rash involving mucus membranes or skin necrosis: No Has patient had a PCN reaction that required hospitalization: No Has patient had a PCN reaction occurring within the last 10 years: No If all of the above answers are "NO", then may proceed with Cephalosporin use.   Tramadol Itching       Latest Ref Rng & Units 06/04/2022    9:59 AM 02/25/2022    4:18 AM 02/23/2022    3:45 AM  CBC  WBC 4.0 - 10.5 K/uL 9.8  8.7  10.3   Hemoglobin 12.0 - 15.0 g/dL 16.1  9.7  09.6   Hematocrit 36.0 - 46.0 % 30.9  28.1  30.5   Platelets 150 - 400 K/uL 392  415  521       CMP     Component Value Date/Time   NA 137 02/25/2022 0418   K 4.2 02/25/2022 0418   CL 107 02/25/2022 0418   CO2 21 (L) 02/25/2022 0418   GLUCOSE 211 (H) 02/25/2022 0418   BUN 24 (H) 02/25/2022 0418   CREATININE 1.17 (H) 02/25/2022 0418   CALCIUM 9.8 02/25/2022 0418   PROT 8.4  (H) 11/30/2021 1050   ALBUMIN 3.5 11/30/2021 1050   AST 14 (L) 11/30/2021 1050   ALT 11 11/30/2021 1050   ALKPHOS 123 11/30/2021 1050   BILITOT 0.3 11/30/2021 1050   GFRNONAA 51 (L) 02/25/2022 0418     No results found.     Assessment & Plan:   1. Hx of BKA, left (HCC) The patient's wound is healed however she is still and admits pain due to the contracture of her below-knee amputation site.  Following discussion, the patient is now agreeable to move forward with a left above-knee amputation revision.  I have discussed the risk, benefits and alternatives.  She is again agreeable.  Patient will follow-up post amputation.  2. Essential hypertension Continue antihypertensive medications as already ordered, these medications have been reviewed and there are no changes at this time.  3. Type 2 diabetes mellitus with peripheral neuropathy (HCC) Continue hypoglycemic medications as already ordered, these medications have been reviewed and there are no changes at this time.  Hgb A1C to be monitored as already arranged by primary service   Current Outpatient Medications on File Prior to Visit  Medication Sig Dispense Refill   acetaminophen (TYLENOL) 325 MG tablet Take 650 mg by mouth every 6 (six) hours as needed.     albuterol (PROVENTIL HFA;VENTOLIN HFA) 108 (90 Base) MCG/ACT inhaler Inhale 2 puffs into the lungs every 6 (six) hours as needed for wheezing or shortness of breath. 1 Inhaler 2   apixaban (ELIQUIS) 5 MG TABS tablet Take 1 tablet (5 mg total) by mouth 2 (two) times daily. 60 tablet 12   atorvastatin (LIPITOR) 10 MG tablet Take 10 mg by mouth at bedtime.  cetirizine (ZYRTEC) 10 MG tablet Take 10 mg by mouth daily as needed for allergies.     ciprofloxacin (CIPRO) 500 MG tablet Take 1 tablet (500 mg total) by mouth 2 (two) times daily. 20 tablet 0   docusate sodium (COLACE) 100 MG capsule Take 100 mg by mouth daily.     DULoxetine (CYMBALTA) 60 MG capsule Take 1 capsule (60  mg total) by mouth daily. 30 capsule 3   ferrous sulfate 325 (65 FE) MG tablet Take 325 mg by mouth daily.      folic acid (FOLVITE) 1 MG tablet Take 1 mg by mouth daily.     gabapentin (NEURONTIN) 300 MG capsule TAKE 3 CAPSULES BY MOUTH IN THE  MORNING AND 3 CAPSULES BY MOUTH  IN THE EVENING 450 capsule 3   hydroxychloroquine (PLAQUENIL) 200 MG tablet Take 200 mg by mouth daily.     JARDIANCE 10 MG TABS tablet Take 10 mg by mouth every morning.     lisinopril (ZESTRIL) 10 MG tablet Take 1 tablet (10 mg total) by mouth daily. (Patient taking differently: Take 10 mg by mouth every morning.) 30 tablet 1   methotrexate (RHEUMATREX) 2.5 MG tablet Take 20 mg by mouth once a week. Has not yet started as of 02/23/22     Multiple Vitamins-Minerals (WOMENS MULTIVITAMIN PO) Take 1 tablet by mouth daily.     ondansetron (ZOFRAN-ODT) 4 MG disintegrating tablet Take 1 tablet (4 mg total) by mouth every 8 (eight) hours as needed for nausea or vomiting. 20 tablet 0   ondansetron (ZOFRAN-ODT) 4 MG disintegrating tablet Take 1 tablet (4 mg total) by mouth every 6 (six) hours as needed for nausea or vomiting. 20 tablet 0   pantoprazole (PROTONIX) 40 MG tablet Take 1 tablet (40 mg total) by mouth daily. (Patient taking differently: Take 40 mg by mouth every morning.) 30 tablet 11   sitaGLIPtin (JANUVIA) 100 MG tablet Take 100 mg by mouth every morning.     VICTOZA 18 MG/3ML SOPN Inject into the skin.     vitamin B-12 (CYANOCOBALAMIN) 500 MCG tablet Take 1 tablet (500 mcg total) by mouth daily. 90 tablet 1   No current facility-administered medications on file prior to visit.    There are no Patient Instructions on file for this visit. No follow-ups on file.   Georgiana Spinner, NP

## 2022-10-16 NOTE — Progress Notes (Signed)
Subjective:    Patient ID: Sylvia [name], female    DOB: January 21, 1956, 67 y.o.   MRN: [id number] Chief Complaint  Patient presents with   Follow-up    Room 7- 2 week follow up no studies    Sylvia [name] is a a 67 year old female who presents today for wound reevaluation on her left below-knee amputation.  She notes that the drainage from the wound has stopped and the wound itself is scabbed over.  However she continues to have significant pain in her knee and upper leg.  Patient's knee is contracted and has been for several months.  The patient has significant pain and difficulty following her amputation and she ultimately ended up with a contracture.  Right below-knee amputation however currently does not have any contracture.    Review of Systems  Musculoskeletal:  Positive for gait problem and joint swelling.  All other systems reviewed and are negative.      Objective:   Physical Exam Vitals reviewed.  HENT:     Head: Normocephalic.  Cardiovascular:     Rate and Rhythm: Normal rate.  Pulmonary:     Effort: Pulmonary effort is normal.  Musculoskeletal:     Right Lower Extremity: Right leg is amputated below knee.     Left Lower Extremity: Left leg is amputated below knee.  Skin:    General: Skin is warm and dry.  Neurological:     Mental Status: She is alert and oriented to person, place, and time.  Psychiatric:        Mood and Affect: Mood normal.        Behavior: Behavior normal.        Thought Content: Thought content normal.        Judgment: Judgment normal.     BP (!) 169/76   Pulse 96   Resp 18   Wt 131 lb (59.4 kg)   BMI 20.52 kg/m   Past Medical History:  Diagnosis Date   Anemia of chronic disease 05/16/2019   Anxiety    h/o   Arthritis    Cocaine abuse (HCC)    COPD (chronic obstructive pulmonary disease) (HCC)    Depression    Diabetes mellitus without complication (HCC)    DKA (diabetic ketoacidosis) (HCC)    Elevated troponin     GERD (gastroesophageal reflux disease)    GI bleed    Hypertension    bp under control-off meds since 2019   Ischemic leg     Social History   Socioeconomic History   Marital status: Divorced    Spouse name: Not on file   Number of children: 2   Years of education: Not on file   Highest education level: Not on file  Occupational History   Not on file  Tobacco Use   Smoking status: Former    Packs/day: 0.25    Years: 45.00    Additional pack years: 0.00    Total pack years: 11.25    Types: Cigarettes   Smokeless tobacco: Never   Tobacco comments:    quit  Vaping Use   Vaping Use: Never used  Substance and Sexual Activity   Alcohol use: No   Drug use: Not Currently    Types: Cocaine    Comment: last used in April 2021 per patient-last 2 drug tests have been negative for cocaine   Sexual activity: Not Currently  Other Topics Concern   Not on file  Social History Narrative   ** Merged  History Encounter **       5 grandchildren and 5 great grandchildren  2 grandchildren live with Pt. (25 & 43 y.o.)   Social Determinants of Health   Financial Resource Strain: Low Risk  (06/14/2018)   Overall Financial Resource Strain (CARDIA)    Difficulty of Paying Living Expenses: Not hard at all  Food Insecurity: No Food Insecurity (02/23/2022)   Hunger Vital Sign    Worried About Running Out of Food in the Last Year: Never true    Ran Out of Food in the Last Year: Never true  Transportation Needs: No Transportation Needs (02/23/2022)   PRAPARE - Administrator, Civil Service (Medical): No    Lack of Transportation (Non-Medical): No  Physical Activity: Insufficiently Active (06/14/2018)   Exercise Vital Sign    Days of Exercise per Week: 4 days    Minutes of Exercise per Session: 20 min  Stress: No Stress Concern Present (06/14/2018)   [employer] of Occupational Health - Occupational Stress Questionnaire    Feeling of Stress : Only a little  Social Connections:  Unknown (06/14/2018)   Social Connection and Isolation Panel [NHANES]    Frequency of Communication with Friends and Family: Patient declined    Frequency of Social Gatherings with Friends and Family: Patient declined    Attends Religious Services: Patient declined    Active Member of Clubs or Organizations: Patient declined    Attends Banker Meetings: Patient declined    Marital Status: Patient declined  Intimate Partner Violence: Not At Risk (02/23/2022)   Humiliation, Afraid, Rape, and Kick questionnaire    Fear of Current or Ex-Partner: No    Emotionally Abused: No    Physically Abused: No    Sexually Abused: No    Past Surgical History:  Procedure Laterality Date   ABDOMINAL HYSTERECTOMY     AMPUTATION Right 12/27/2018   Procedure: AMPUTATION BELOW KNEE;  Surgeon: Annice Needy, MD;  Location: [facility];  Service: General;  Laterality: Right;   AMPUTATION Left 11/05/2021   Procedure: AMPUTATION BELOW KNEE;  Surgeon: Annice Needy, MD;  Location: [facility];  Service: Vascular;  Laterality: Left;   APPLICATION OF WOUND VAC  10/16/2021   Procedure: APPLICATION OF WOUND VAC;  Surgeon: Annice Needy, MD;  Location: [facility];  Service: Vascular;;   CATARACT EXTRACTION W/PHACO Right 03/27/2020   Procedure: CATARACT EXTRACTION PHACO AND INTRAOCULAR LENS PLACEMENT (IOC) RIGHT DIABETIC 7.54 00:52.5;  Surgeon: Galen Manila, MD;  Location: [facility];  Service: Ophthalmology;  Laterality: Right;   CATARACT EXTRACTION W/PHACO Left 05/20/2020   Procedure: CATARACT EXTRACTION PHACO AND INTRAOCULAR LENS PLACEMENT (IOC) LEFT DIABETIC 4.95 00:37.6;  Surgeon: Galen Manila, MD;  Location: [facility];  Service: Ophthalmology;  Laterality: Left;  Diabetic - oral meds COVID + 04-24-20   CHOLECYSTECTOMY     COLONOSCOPY WITH PROPOFOL N/A 04/16/2019   Procedure: COLONOSCOPY WITH PROPOFOL;  Surgeon: Pasty Spillers, MD;  Location: [facility];  Service: Endoscopy;   Laterality: N/A;   COLONOSCOPY WITH PROPOFOL N/A 01/16/2020   Procedure: COLONOSCOPY WITH PROPOFOL;  Surgeon: Pasty Spillers, MD;  Location: [facility];  Service: Endoscopy;  Laterality: N/A;   ENDARTERECTOMY FEMORAL Left 10/16/2021   Procedure: ENDARTERECTOMY FEMORAL;  Surgeon: Annice Needy, MD;  Location: [facility];  Service: Vascular;  Laterality: Left;   ESOPHAGOGASTRODUODENOSCOPY (EGD) WITH PROPOFOL N/A 06/15/2018   Procedure: ESOPHAGOGASTRODUODENOSCOPY (EGD) WITH PROPOFOL;  Surgeon: Norma Fredrickson, Boykin Nearing, MD;  Location:  [facility];  Service: Gastroenterology;  Laterality: N/A;   ESOPHAGOGASTRODUODENOSCOPY (EGD) WITH PROPOFOL N/A 01/03/2019   Procedure: ESOPHAGOGASTRODUODENOSCOPY (EGD) WITH PROPOFOL;  Surgeon: Midge Minium, MD;  Location: [facility];  Service: Endoscopy;  Laterality: N/A;   ESOPHAGOGASTRODUODENOSCOPY (EGD) WITH PROPOFOL N/A 09/25/2021   Procedure: ESOPHAGOGASTRODUODENOSCOPY (EGD) WITH PROPOFOL;  Surgeon: Midge Minium, MD;  Location: [facility];  Service: Endoscopy;  Laterality: N/A;   GIVENS CAPSULE STUDY  04/16/2019   Procedure: GIVENS CAPSULE STUDY;  Surgeon: Pasty Spillers, MD;  Location: [facility];  Service: Endoscopy;;   GIVENS CAPSULE STUDY N/A 06/25/2019   Procedure: GIVENS CAPSULE STUDY;  Surgeon: Wyline Mood, MD;  Location: [facility];  Service: Gastroenterology;  Laterality: N/A;   INCISION AND DRAINAGE OF WOUND Left 02/24/2022   Procedure: LEFT BKA IRRIGATION AND DEBRIDEMENT WOUND;  Surgeon: Annice Needy, MD;  Location: [facility];  Service: Vascular;  Laterality: Left;   LOWER EXTREMITY ANGIOGRAPHY Right 08/21/2018   Procedure: LOWER EXTREMITY ANGIOGRAPHY;  Surgeon: Annice Needy, MD;  Location: [facility];  Service: Cardiovascular;  Laterality: Right;   LOWER EXTREMITY ANGIOGRAPHY Left 08/28/2018   Procedure: LOWER EXTREMITY ANGIOGRAPHY;  Surgeon: Annice Needy, MD;  Location: [facility];  Service: Cardiovascular;  Laterality:  Left;   LOWER EXTREMITY ANGIOGRAPHY Right 08/28/2018   Procedure: Lower Extremity Angiography;  Surgeon: Annice Needy, MD;  Location: [facility];  Service: Cardiovascular;  Laterality: Right;   LOWER EXTREMITY ANGIOGRAPHY Right 12/18/2018   Procedure: Lower Extremity Angiography;  Surgeon: Annice Needy, MD;  Location: [facility];  Service: Cardiovascular;  Laterality: Right;   LOWER EXTREMITY ANGIOGRAPHY Right 12/18/2018   Procedure: Lower Extremity Angiography;  Surgeon: Annice Needy, MD;  Location: [facility];  Service: Cardiovascular;  Laterality: Right;   LOWER EXTREMITY ANGIOGRAPHY Left 12/21/2018   Procedure: Lower Extremity Angiography;  Surgeon: Annice Needy, MD;  Location: [facility];  Service: Cardiovascular;  Laterality: Left;   LOWER EXTREMITY ANGIOGRAPHY Right 12/21/2018   Procedure: Lower Extremity Angiography;  Surgeon: Annice Needy, MD;  Location: [facility];  Service: Cardiovascular;  Laterality: Right;   LOWER EXTREMITY ANGIOGRAPHY Left 12/25/2020   Procedure: LOWER EXTREMITY ANGIOGRAPHY;  Surgeon: Annice Needy, MD;  Location: [facility];  Service: Cardiovascular;  Laterality: Left;   LOWER EXTREMITY ANGIOGRAPHY Left 09/21/2021   Procedure: Lower Extremity Angiography;  Surgeon: Annice Needy, MD;  Location: [facility];  Service: Cardiovascular;  Laterality: Left;   LOWER EXTREMITY ANGIOGRAPHY Left 10/13/2021   Procedure: Lower Extremity Angiography;  Surgeon: Renford Dills, MD;  Location: [facility];  Service: Cardiovascular;  Laterality: Left;   LOWER EXTREMITY ANGIOGRAPHY Left 10/14/2021   Procedure: Lower Extremity Angiography;  Surgeon: Annice Needy, MD;  Location: [facility];  Service: Cardiovascular;  Laterality: Left;   LOWER EXTREMITY ANGIOGRAPHY Left 11/02/2021   Procedure: Lower Extremity Angiography;  Surgeon: Annice Needy, MD;  Location: [facility];  Service: Cardiovascular;   Laterality: Left;   LOWER EXTREMITY INTERVENTION N/A 12/22/2018   Procedure: LOWER EXTREMITY INTERVENTION;  Surgeon: Annice Needy, MD;  Location: [facility];  Service: Cardiovascular;  Laterality: N/A;   LOWER EXTREMITY INTERVENTION Left 12/26/2020   Procedure: LOWER EXTREMITY INTERVENTION;  Surgeon: Annice Needy, MD;  Location: [facility];  Service: Cardiovascular;  Laterality: Left;   LOWER EXTREMITY INTERVENTION N/A 09/22/2021  Procedure: LOWER EXTREMITY INTERVENTION;  Surgeon: Annice Needy, MD;  Location: [facility];  Service: Cardiovascular;  Laterality: N/A;    Family History  Problem Relation Age of Onset   Breast cancer Mother 62    Allergies  Allergen Reactions   Vancomycin Rash    Patient developed a rash to injection site and arm a few mintes after starting ABX.    Hydrocodone Rash   Metformin And Related Diarrhea   Penicillins Hives    Has patient had a PCN reaction causing immediate rash, facial/tongue/throat swelling, SOB or lightheadedness with hypotension: Yes Has patient had a PCN reaction causing severe rash involving mucus membranes or skin necrosis: No Has patient had a PCN reaction that required hospitalization: No Has patient had a PCN reaction occurring within the last 10 years: No If all of the above answers are "NO", then may proceed with Cephalosporin use.   Tramadol Itching       Latest Ref Rng & Units 06/04/2022    9:59 AM 02/25/2022    4:18 AM 02/23/2022    3:45 AM  CBC  WBC 4.0 - 10.5 K/uL 9.8  8.7  10.3   Hemoglobin 12.0 - 15.0 g/dL 16.1  9.7  09.6   Hematocrit 36.0 - 46.0 % 30.9  28.1  30.5   Platelets 150 - 400 K/uL 392  415  521       CMP     Component Value Date/Time   NA 137 02/25/2022 0418   K 4.2 02/25/2022 0418   CL 107 02/25/2022 0418   CO2 21 (L) 02/25/2022 0418   GLUCOSE 211 (H) 02/25/2022 0418   BUN 24 (H) 02/25/2022 0418   CREATININE 1.17 (H) 02/25/2022 0418   CALCIUM 9.8 02/25/2022 0418   PROT 8.4  (H) 11/30/2021 1050   ALBUMIN 3.5 11/30/2021 1050   AST 14 (L) 11/30/2021 1050   ALT 11 11/30/2021 1050   ALKPHOS 123 11/30/2021 1050   BILITOT 0.3 11/30/2021 1050   GFRNONAA 51 (L) 02/25/2022 0418     No results found.     Assessment & Plan:   1. Hx of BKA, left (HCC) The patient's wound is healed however she is still and admits pain due to the contracture of her below-knee amputation site.  Following discussion, the patient is now agreeable to move forward with a left above-knee amputation revision.  I have discussed the risk, benefits and alternatives.  She is again agreeable.  Patient will follow-up post amputation.  2. Essential hypertension Continue antihypertensive medications as already ordered, these medications have been reviewed and there are no changes at this time.  3. Type 2 diabetes mellitus with peripheral neuropathy (HCC) Continue hypoglycemic medications as already ordered, these medications have been reviewed and there are no changes at this time.  Hgb A1C to be monitored as already arranged by primary service   Current Outpatient Medications on File Prior to Visit  Medication Sig Dispense Refill   acetaminophen (TYLENOL) 325 MG tablet Take 650 mg by mouth every 6 (six) hours as needed.     albuterol (PROVENTIL HFA;VENTOLIN HFA) 108 (90 Base) MCG/ACT inhaler Inhale 2 puffs into the lungs every 6 (six) hours as needed for wheezing or shortness of breath. 1 Inhaler 2   apixaban (ELIQUIS) 5 MG TABS tablet Take 1 tablet (5 mg total) by mouth 2 (two) times daily. 60 tablet 12   atorvastatin (LIPITOR) 10 MG tablet Take 10 mg by mouth at bedtime.  cetirizine (ZYRTEC) 10 MG tablet Take 10 mg by mouth daily as needed for allergies.     ciprofloxacin (CIPRO) 500 MG tablet Take 1 tablet (500 mg total) by mouth 2 (two) times daily. 20 tablet 0   docusate sodium (COLACE) 100 MG capsule Take 100 mg by mouth daily.     DULoxetine (CYMBALTA) 60 MG capsule Take 1 capsule (60  mg total) by mouth daily. 30 capsule 3   ferrous sulfate 325 (65 FE) MG tablet Take 325 mg by mouth daily.      folic acid (FOLVITE) 1 MG tablet Take 1 mg by mouth daily.     gabapentin (NEURONTIN) 300 MG capsule TAKE 3 CAPSULES BY MOUTH IN THE  MORNING AND 3 CAPSULES BY MOUTH  IN THE EVENING 450 capsule 3   hydroxychloroquine (PLAQUENIL) 200 MG tablet Take 200 mg by mouth daily.     JARDIANCE 10 MG TABS tablet Take 10 mg by mouth every morning.     lisinopril (ZESTRIL) 10 MG tablet Take 1 tablet (10 mg total) by mouth daily. (Patient taking differently: Take 10 mg by mouth every morning.) 30 tablet 1   methotrexate (RHEUMATREX) 2.5 MG tablet Take 20 mg by mouth once a week. Has not yet started as of 02/23/22     Multiple Vitamins-Minerals (WOMENS MULTIVITAMIN PO) Take 1 tablet by mouth daily.     ondansetron (ZOFRAN-ODT) 4 MG disintegrating tablet Take 1 tablet (4 mg total) by mouth every 8 (eight) hours as needed for nausea or vomiting. 20 tablet 0   ondansetron (ZOFRAN-ODT) 4 MG disintegrating tablet Take 1 tablet (4 mg total) by mouth every 6 (six) hours as needed for nausea or vomiting. 20 tablet 0   pantoprazole (PROTONIX) 40 MG tablet Take 1 tablet (40 mg total) by mouth daily. (Patient taking differently: Take 40 mg by mouth every morning.) 30 tablet 11   sitaGLIPtin (JANUVIA) 100 MG tablet Take 100 mg by mouth every morning.     VICTOZA 18 MG/3ML SOPN Inject into the skin.     vitamin B-12 (CYANOCOBALAMIN) 500 MCG tablet Take 1 tablet (500 mcg total) by mouth daily. 90 tablet 1   No current facility-administered medications on file prior to visit.    There are no Patient Instructions on file for this visit. No follow-ups on file.   Georgiana Spinner, NP

## 2022-10-26 ENCOUNTER — Other Ambulatory Visit: Payer: Self-pay | Admitting: Family Medicine

## 2022-10-26 ENCOUNTER — Ambulatory Visit
Admission: RE | Admit: 2022-10-26 | Discharge: 2022-10-26 | Disposition: A | Discharge status: Home / Self Care | Payer: 59 | Source: Ambulatory Visit | Attending: Family Medicine | Admitting: Family Medicine

## 2022-10-26 ENCOUNTER — Ambulatory Visit
Admission: RE | Admit: 2022-10-26 | Discharge: 2022-10-26 | Disposition: A | Discharge status: Home / Self Care | Payer: 59 | Attending: Family Medicine | Admitting: Family Medicine

## 2022-10-26 ENCOUNTER — Other Ambulatory Visit (INDEPENDENT_AMBULATORY_CARE_PROVIDER_SITE_OTHER): Payer: Self-pay | Admitting: Nurse Practitioner

## 2022-10-26 ENCOUNTER — Encounter
Admission: RE | Admit: 2022-10-26 | Discharge: 2022-10-26 | Disposition: A | Discharge status: Home / Self Care | Payer: 59 | Source: Ambulatory Visit | Attending: Vascular Surgery | Admitting: Vascular Surgery

## 2022-10-26 ENCOUNTER — Other Ambulatory Visit: Payer: Self-pay

## 2022-10-26 ENCOUNTER — Telehealth (INDEPENDENT_AMBULATORY_CARE_PROVIDER_SITE_OTHER): Payer: Self-pay

## 2022-10-26 DIAGNOSIS — E1142 Type 2 diabetes mellitus with diabetic polyneuropathy: Secondary | ICD-10-CM

## 2022-10-26 DIAGNOSIS — M545 Low back pain, unspecified: Secondary | ICD-10-CM | POA: Insufficient documentation

## 2022-10-26 DIAGNOSIS — I10 Essential (primary) hypertension: Secondary | ICD-10-CM

## 2022-10-26 DIAGNOSIS — M25561 Pain in right knee: Secondary | ICD-10-CM | POA: Diagnosis present

## 2022-10-26 DIAGNOSIS — M25562 Pain in left knee: Secondary | ICD-10-CM

## 2022-10-26 DIAGNOSIS — I998 Other disorder of circulatory system: Secondary | ICD-10-CM

## 2022-10-26 HISTORY — DX: Atherosclerotic heart disease of native coronary artery without angina pectoris: I25.10

## 2022-10-26 HISTORY — DX: Chronic kidney disease, unspecified: N18.9

## 2022-10-26 NOTE — Pre-Procedure Instructions (Signed)
Patient stated last time she took  VICTOZA 32 MG/3ML SOPN was 10/24/22. Was not informed by provider to stop it 7 days before the procedure.

## 2022-10-26 NOTE — Patient Instructions (Addendum)
Your procedure is scheduled on: Thursday October 28, 2022. Report to the Registration Desk on the 1st floor of the [facility]. To find out your arrival time, please call [phone] between 1PM - 3PM on: Wednesday October 27, 2022. If your arrival time is 6:00 am, do not arrive before that time as the [facility] entrance doors do not open until 6:00 am.  REMEMBER: Instructions that are not followed completely may result in serious medical risk, up to and including death; or upon the discretion of your surgeon and anesthesiologist your surgery may need to be rescheduled.  Do not eat food or drink fluids after midnight the night before surgery.  No gum chewing or hard candies.   One week prior to surgery: Stop Anti-inflammatories (NSAIDS) such as Advil, Aleve, Ibuprofen, Motrin, Naproxen, Naprosyn and Aspirin based products such as Excedrin, Goody's Powder, BC Powder. Stop ANY OVER THE COUNTER supplements until after surgery. WOMENS MULTIVITAMIN  You may however, continue to take Tylenol if needed for pain up until the day of surgery.  Continue taking all prescribed medications with the exception of the following:   Follow recommendations from Cardiologist or PCP regarding stopping blood thinners. Stop apixaban (ELIQUIS) 5 MG TABS 2 days before your procedure as instructed by your surgeon.  TAKE ONLY THESE MEDICATIONS THE MORNING OF SURGERY WITH A SIP OF WATER:  DULoxetine (CYMBALTA) 60 MG gabapentin (NEURONTIN) 900 MG  hydroxychloroquine (PLAQUENIL) 200 MG  cyclobenzaprine (FLEXERIL) 5 MG  pantoprazole (PROTONIX) 40 MG Antacid (take one the night before and one on the morning of surgery - helps to prevent nausea after surgery.)  Use your inhaler albuterol (PROVENTIL HFA;VENTOLIN HFA) 108 (90 Base) MCG/ACT inhaler  the morning of your surgery and bring to hospital with you.   No Alcohol for 24 hours before or after surgery.  No Smoking including e-cigarettes for 24 hours before  surgery.  No chewable tobacco products for at least 6 hours before surgery.  No nicotine patches on the day of surgery.  Do not use any "recreational" drugs for at least a week (preferably 2 weeks) before your surgery.  Please be advised that the combination of cocaine and anesthesia may have negative outcomes, up to and including death. If you test positive for cocaine, your surgery will be cancelled.  On the morning of surgery brush your teeth with toothpaste and water, you may rinse your mouth with mouthwash if you wish. Do not swallow any toothpaste or mouthwash.  Use CHG Soap or wipes as directed on instruction sheet.  Do not wear jewelry, make-up, hairpins, clips or nail polish.  Do not wear lotions, powders, or perfumes.   Do not shave body hair from the neck down 48 hours before surgery.  Contact lenses, hearing aids and dentures may not be worn into surgery.  Do not bring valuables to the hospital. [facility] is not responsible for any missing/lost belongings or valuables.   Notify your doctor if there is any change in your medical condition (cold, fever, infection).  Wear comfortable clothing (specific to your surgery type) to the hospital.  After surgery, you can help prevent lung complications by doing breathing exercises.  Take deep breaths and cough every 1-2 hours. Your doctor may order a device called an Incentive Spirometer to help you take deep breaths. When coughing or sneezing, hold a pillow firmly against your incision with both hands. This is called "splinting." Doing this helps protect your incision. It also decreases belly discomfort.  If  you are being admitted to the hospital overnight, leave your suitcase in the car. After surgery it may be brought to your room.  In case of increased patient census, it may be necessary for you, the patient, to continue your postoperative care in the Same Day Surgery department.  If you are being discharged the day of  surgery, you will not be allowed to drive home. You will need a responsible individual to drive you home and stay with you for 24 hours after surgery.   If you are taking public transportation, you will need to have a responsible individual with you.  Please call the Pre-admissions Testing Dept. at [phone] if you have any questions about these instructions.  Surgery Visitation Policy:  Patients having surgery or a procedure may have two visitors.  Children under the age of 43 must have an adult with them who is not the patient.  Inpatient Visitation:    Visiting hours are 7 a.m. to 8 p.m. Up to four visitors are allowed at one time in a patient room. The visitors may rotate out with other people during the day.  One visitor age 30 or older may stay with the patient overnight and must be in the room by 8 p.m.  Preparing the Skin Before Surgery     To help prevent the risk of infection at your surgical site, we are now providing you with rinse-free Sage 2% Chlorhexidine Gluconate (CHG) disposable wipes.  Chlorhexidine Gluconate (CHG) Soap  o An antiseptic cleaner that kills germs and bonds with the skin to continue killing germs even after washing  o Used for showering the night before surgery and morning of surgery  The night before surgery: Shower or bathe with warm water. Do not apply perfume, lotions, powders. Wait one hour after shower. Skin should be dry and cool. Open Sage wipe package - use 6 disposable cloths. Wipe body using one cloth for the right arm, one cloth for the left arm, one cloth for the right leg, one cloth for the left leg, one cloth for the chest/abdomen area, and one cloth for the back. Do not use on open wounds or sores. Do not use on face or genitals (private parts). If you are breast feeding, do not use on breasts. 5. Do not rinse, allow to dry. 6. Skin may feel "tacky" for several minutes. 7. Dress in clean clothes. 8. Place clean sheets on  your bed and do not sleep with pets.  REPEAT ABOVE ON THE MORNING OF SURGERY BEFORE ARRIVING TO THE HOSPITAL.

## 2022-10-26 NOTE — Telephone Encounter (Signed)
Patient has to be off her med for seven days and has been rescheduled from 10/28/22 to 11/04/22 with Dr. Wyn Quaker at the [facility].

## 2022-10-26 NOTE — Telephone Encounter (Signed)
Spoke with the patient and she is scheduled with Dr. Wyn Quaker on 10/28/22 for a left AKA at the [facility]. Pre-op is a phone call today between 1-5 pm. Pre-surgical instructions were discussed and will be sent to Mychart.

## 2022-10-27 ENCOUNTER — Inpatient Hospital Stay: Admission: RE | Admit: 2022-10-27 | Payer: 59 | Source: Ambulatory Visit

## 2022-10-28 NOTE — Progress Notes (Signed)
  Perioperative Services Pre-Admission/Anesthesia Testing    Date: 10/28/22  Name: Sylvia [name] MRN:   [id number]  Re: GLP-1 clearance and provider recommendations   Planned Surgical Procedure(s):    Case: [id number] Date/Time: 11/04/22 0715   Procedure: AMPUTATION ABOVE KNEE (Left: Knee)   Anesthesia type: General   Pre-op diagnosis: ISCHEMIC LEG   Location: [facility] OR ROOM 08 / [facility]   Surgeons: Annice Needy, MD      Clinical Notes:  Patient is scheduled for the above procedure with the indicated provider/surgeon. In review of her medication reconciliation it was noted that patient is on a prescribed GLP-1 medication. Per guidelines issued by the American Society of Anesthesiologists (ASA), it is recommended that these medications be held for 7 days prior to the patient undergoing any type of elective surgical procedure. The patient is taking the following GLP-1 medication:  []  SEMAGLUTIDE   []  EXENATIDE  [x]  LIRAGLUTIDE   []  LIXISENATIDE  []  DULAGLUTIDE     []  TIRZEPATIDE (GLP-1/GIP)  Reached out to prescribing provider Sindy Messing, PA-C) to make them aware of the guidelines from anesthesia. Given that this patient takes the prescribed GLP-1 medication for her  diabetes diagnosis, rather than for weight loss, recommendations from the prescribing provider were solicited. Prescribing provider made aware of the following so that informed decision/POC can be developed for this patient that may be taking medications belonging to these drug classes:  Oral GLP-1 medications will be held 1 day prior to surgery.  Injectable GLP-1 medications will be held 7 days prior to surgery.  Metformin is routinely held 48 hours prior to surgery due to renal concerns, potential need for contrasted imaging perioperatively, and the potential for tissue hypoxia leading to drug induced lactic acidosis.  All SGLT2i medications are held 72 hours prior to surgery as they can be  associated with the increased potential for developing euglycemic diabetic ketoacidosis (EDKA).   Impression and Plan:  Sylvia [name] is on a prescribed GLP-1 medication, which induces the known side effect of decreased gastric emptying. Efforts are bring made to mitigate the risk of perioperative hyperglycemic events, as elevated blood glucose levels have been found to contribute to intra/postoperative complications. Additionally, hyperglycemic extremes can potentially necessitate the postponing of a patient's elective case in order to better optimize perioperative glycemic control, again with the aforementioned guidelines in place. With this in mind, recommendations have been sought from the prescribing provider, who has cleared patient to proceed with holding the prescribed GLP-1 as per the guidelines from the ASA.   Provider recommending: no further recommendations received from the prescribing provider.  Copy of signed clearance and recommendations placed on patient's chart for inclusion in their medical record and for review by the surgical/anesthetic team on the day of her procedure.   Quentin Mulling, MSN, APRN, FNP-C, CEN [facility]  Peri-operative Services Nurse Practitioner Phone: [phone] 10/28/22 4:07 PM  NOTE: This note has been prepared using Dragon dictation software. Despite my best ability to proofread, there is always the potential that unintentional transcriptional errors may still occur from this process.

## 2022-11-01 ENCOUNTER — Encounter
Admission: RE | Admit: 2022-11-01 | Discharge: 2022-11-01 | Disposition: A | Discharge status: Home / Self Care | Payer: 59 | Source: Ambulatory Visit | Attending: Vascular Surgery | Admitting: Vascular Surgery

## 2022-11-01 DIAGNOSIS — I160 Hypertensive urgency: Secondary | ICD-10-CM | POA: Diagnosis not present

## 2022-11-01 DIAGNOSIS — Z01818 Encounter for other preprocedural examination: Secondary | ICD-10-CM | POA: Insufficient documentation

## 2022-11-01 DIAGNOSIS — I998 Other disorder of circulatory system: Secondary | ICD-10-CM | POA: Insufficient documentation

## 2022-11-01 LAB — CBC WITH DIFFERENTIAL/PLATELET
Abs Immature Granulocytes: 0.03 10*3/uL (ref 0.00–0.07)
Basophils Absolute: 0.1 10*3/uL (ref 0.0–0.1)
Basophils Relative: 1 %
Eosinophils Absolute: 0.2 10*3/uL (ref 0.0–0.5)
Eosinophils Relative: 2 %
HCT: 27.9 % — ABNORMAL LOW (ref 36.0–46.0)
Hemoglobin: 9.7 g/dL — ABNORMAL LOW (ref 12.0–15.0)
Immature Granulocytes: 0 %
Lymphocytes Relative: 23 %
Lymphs Abs: 2 10*3/uL (ref 0.7–4.0)
MCH: 28 pg (ref 26.0–34.0)
MCHC: 34.8 g/dL (ref 30.0–36.0)
MCV: 80.6 fL (ref 80.0–100.0)
Monocytes Absolute: 0.8 10*3/uL (ref 0.1–1.0)
Monocytes Relative: 9 %
Neutro Abs: 5.5 10*3/uL (ref 1.7–7.7)
Neutrophils Relative %: 65 %
Platelets: 454 10*3/uL — ABNORMAL HIGH (ref 150–400)
RBC: 3.46 MIL/uL — ABNORMAL LOW (ref 3.87–5.11)
RDW: 14.8 % (ref 11.5–15.5)
WBC: 8.6 10*3/uL (ref 4.0–10.5)
nRBC: 0 % (ref 0.0–0.2)

## 2022-11-01 LAB — BASIC METABOLIC PANEL
Anion gap: 9 (ref 5–15)
BUN: 19 mg/dL (ref 8–23)
CO2: 23 mmol/L (ref 22–32)
Calcium: 9.7 mg/dL (ref 8.9–10.3)
Chloride: 109 mmol/L (ref 98–111)
Creatinine, Ser: 1.2 mg/dL — ABNORMAL HIGH (ref 0.44–1.00)
GFR, Estimated: 50 mL/min — ABNORMAL LOW (ref >60)
Glucose, Bld: 168 mg/dL — ABNORMAL HIGH (ref 70–99)
Potassium: 4 mmol/L (ref 3.5–5.1)
Sodium: 141 mmol/L (ref 135–145)

## 2022-11-04 ENCOUNTER — Inpatient Hospital Stay: Payer: 59 | Admitting: Urgent Care

## 2022-11-04 ENCOUNTER — Encounter: Payer: Self-pay | Admitting: Vascular Surgery

## 2022-11-04 ENCOUNTER — Inpatient Hospital Stay
Admission: RE | Admit: 2022-11-04 | Discharge: 2022-11-09 | DRG: 476 | Disposition: A | Discharge status: Home Health | Payer: 59 | Attending: Vascular Surgery | Admitting: Vascular Surgery

## 2022-11-04 ENCOUNTER — Encounter
Admission: RE | Disposition: A | Discharge status: Home / Self Care | Payer: Self-pay | Source: Home / Self Care | Attending: Vascular Surgery

## 2022-11-04 DIAGNOSIS — I2510 Atherosclerotic heart disease of native coronary artery without angina pectoris: Secondary | ICD-10-CM | POA: Diagnosis present

## 2022-11-04 DIAGNOSIS — J449 Chronic obstructive pulmonary disease, unspecified: Secondary | ICD-10-CM | POA: Diagnosis present

## 2022-11-04 DIAGNOSIS — N1831 Chronic kidney disease, stage 3a: Secondary | ICD-10-CM | POA: Diagnosis present

## 2022-11-04 DIAGNOSIS — Z87891 Personal history of nicotine dependence: Secondary | ICD-10-CM | POA: Diagnosis not present

## 2022-11-04 DIAGNOSIS — Z7901 Long term (current) use of anticoagulants: Secondary | ICD-10-CM | POA: Diagnosis not present

## 2022-11-04 DIAGNOSIS — E1122 Type 2 diabetes mellitus with diabetic chronic kidney disease: Secondary | ICD-10-CM | POA: Diagnosis present

## 2022-11-04 DIAGNOSIS — Z90710 Acquired absence of both cervix and uterus: Secondary | ICD-10-CM | POA: Diagnosis not present

## 2022-11-04 DIAGNOSIS — D631 Anemia in chronic kidney disease: Secondary | ICD-10-CM | POA: Diagnosis present

## 2022-11-04 DIAGNOSIS — Z79899 Other long term (current) drug therapy: Secondary | ICD-10-CM | POA: Diagnosis not present

## 2022-11-04 DIAGNOSIS — Z9889 Other specified postprocedural states: Secondary | ICD-10-CM | POA: Diagnosis not present

## 2022-11-04 DIAGNOSIS — Z89512 Acquired absence of left leg below knee: Secondary | ICD-10-CM | POA: Diagnosis not present

## 2022-11-04 DIAGNOSIS — M25562 Pain in left knee: Secondary | ICD-10-CM

## 2022-11-04 DIAGNOSIS — K219 Gastro-esophageal reflux disease without esophagitis: Secondary | ICD-10-CM | POA: Diagnosis present

## 2022-11-04 DIAGNOSIS — Z8616 Personal history of COVID-19: Secondary | ICD-10-CM | POA: Diagnosis not present

## 2022-11-04 DIAGNOSIS — S78119A Complete traumatic amputation at level between unspecified hip and knee, initial encounter: Secondary | ICD-10-CM | POA: Diagnosis present

## 2022-11-04 DIAGNOSIS — F32A Depression, unspecified: Secondary | ICD-10-CM | POA: Diagnosis present

## 2022-11-04 DIAGNOSIS — Z7984 Long term (current) use of oral hypoglycemic drugs: Secondary | ICD-10-CM | POA: Diagnosis not present

## 2022-11-04 DIAGNOSIS — M24562 Contracture, left knee: Secondary | ICD-10-CM | POA: Diagnosis not present

## 2022-11-04 DIAGNOSIS — L97929 Non-pressure chronic ulcer of unspecified part of left lower leg with unspecified severity: Secondary | ICD-10-CM | POA: Diagnosis not present

## 2022-11-04 DIAGNOSIS — E1142 Type 2 diabetes mellitus with diabetic polyneuropathy: Secondary | ICD-10-CM | POA: Diagnosis present

## 2022-11-04 DIAGNOSIS — T8789 Other complications of amputation stump: Principal | ICD-10-CM | POA: Diagnosis present

## 2022-11-04 DIAGNOSIS — M1990 Unspecified osteoarthritis, unspecified site: Secondary | ICD-10-CM | POA: Diagnosis present

## 2022-11-04 DIAGNOSIS — Z8719 Personal history of other diseases of the digestive system: Secondary | ICD-10-CM | POA: Diagnosis not present

## 2022-11-04 DIAGNOSIS — Y835 Amputation of limb(s) as the cause of abnormal reaction of the patient, or of later complication, without mention of misadventure at the time of the procedure: Secondary | ICD-10-CM | POA: Diagnosis present

## 2022-11-04 DIAGNOSIS — I129 Hypertensive chronic kidney disease with stage 1 through stage 4 chronic kidney disease, or unspecified chronic kidney disease: Secondary | ICD-10-CM | POA: Diagnosis present

## 2022-11-04 HISTORY — PX: AMPUTATION: SHX166

## 2022-11-04 LAB — GLUCOSE, CAPILLARY
Glucose-Capillary: 168 mg/dL — ABNORMAL HIGH (ref 70–99)
Glucose-Capillary: 169 mg/dL — ABNORMAL HIGH (ref 70–99)

## 2022-11-04 SURGERY — AMPUTATION, ABOVE KNEE
Anesthesia: General | Site: Knee | Laterality: Left

## 2022-11-04 MED ORDER — SODIUM CHLORIDE 0.9 % IV SOLN: INTRAVENOUS | Status: DC

## 2022-11-04 MED ORDER — PHENOL 1.4 % MT LIQD: 1.0000 | OROMUCOSAL | Status: DC | PRN

## 2022-11-04 MED ORDER — ALBUTEROL SULFATE (2.5 MG/3ML) 0.083% IN NEBU: 2.5000 mg | INHALATION_SOLUTION | Freq: Four times a day (QID) | RESPIRATORY_TRACT | Status: DC | PRN

## 2022-11-04 MED ORDER — METOPROLOL TARTRATE 5 MG/5ML IV SOLN: 2.0000 mg | INTRAVENOUS | Status: DC | PRN

## 2022-11-04 MED ORDER — PROPOFOL 1000 MG/100ML IV EMUL
INTRAVENOUS | Status: AC
  Filled 2022-11-04: qty 100

## 2022-11-04 MED ORDER — LIRAGLUTIDE 18 MG/3ML ~~LOC~~ SOPN: 0.6000 mg | PEN_INJECTOR | Freq: Every day | SUBCUTANEOUS | Status: DC

## 2022-11-04 MED ORDER — OXYCODONE HCL 5 MG PO TABS: 5.0000 mg | ORAL_TABLET | Freq: Once | ORAL | Status: DC | PRN

## 2022-11-04 MED ORDER — CIPROFLOXACIN IN D5W 400 MG/200ML IV SOLN
INTRAVENOUS | Status: AC
  Filled 2022-11-04: qty 200

## 2022-11-04 MED ORDER — HYDRALAZINE HCL 20 MG/ML IJ SOLN
5.0000 mg | INTRAMUSCULAR | Status: DC | PRN
  Administered 2022-11-06: 5 mg via INTRAVENOUS
  Filled 2022-11-04: qty 1

## 2022-11-04 MED ORDER — DOCUSATE SODIUM 100 MG PO CAPS
100.0000 mg | ORAL_CAPSULE | Freq: Every day | ORAL | Status: DC
  Administered 2022-11-05 – 2022-11-09 (×5): 100 mg via ORAL
  Filled 2022-11-04 (×5): qty 1

## 2022-11-04 MED ORDER — ESMOLOL HCL 100 MG/10ML IV SOLN
INTRAVENOUS | Status: DC | PRN
  Administered 2022-11-04: 10 mg via INTRAVENOUS

## 2022-11-04 MED ORDER — POLYETHYLENE GLYCOL 3350 17 G PO PACK
17.0000 g | PACK | Freq: Every day | ORAL | Status: DC | PRN
  Administered 2022-11-08: 17 g via ORAL
  Filled 2022-11-04: qty 1

## 2022-11-04 MED ORDER — OXYCODONE-ACETAMINOPHEN 5-325 MG PO TABS
1.0000 | ORAL_TABLET | Freq: Four times a day (QID) | ORAL | Status: DC | PRN
  Administered 2022-11-04 – 2022-11-08 (×8): 1 via ORAL
  Filled 2022-11-04 (×8): qty 1

## 2022-11-04 MED ORDER — VITAMIN B-12 1000 MCG PO TABS
500.0000 ug | ORAL_TABLET | Freq: Every day | ORAL | Status: DC
  Administered 2022-11-04 – 2022-11-09 (×6): 500 ug via ORAL
  Filled 2022-11-04 (×6): qty 1

## 2022-11-04 MED ORDER — CHLORHEXIDINE GLUCONATE 0.12 % MT SOLN
15.0000 mL | Freq: Once | OROMUCOSAL | Status: AC
  Administered 2022-11-04: 15 mL via OROMUCOSAL

## 2022-11-04 MED ORDER — ORAL CARE MOUTH RINSE: 15.0000 mL | OROMUCOSAL | Status: DC | PRN

## 2022-11-04 MED ORDER — LIDOCAINE HCL (CARDIAC) PF 100 MG/5ML IV SOSY
PREFILLED_SYRINGE | INTRAVENOUS | Status: DC | PRN
  Administered 2022-11-04: 60 mg via INTRAVENOUS

## 2022-11-04 MED ORDER — ALUM & MAG HYDROXIDE-SIMETH 200-200-20 MG/5ML PO SUSP: 15.0000 mL | ORAL | Status: DC | PRN

## 2022-11-04 MED ORDER — CHLORHEXIDINE GLUCONATE 0.12 % MT SOLN
OROMUCOSAL | Status: AC
  Filled 2022-11-04: qty 15

## 2022-11-04 MED ORDER — CIPROFLOXACIN IN D5W 400 MG/200ML IV SOLN
INTRAVENOUS | Status: DC | PRN
  Administered 2022-11-04: 400 mg via INTRAVENOUS

## 2022-11-04 MED ORDER — GABAPENTIN 300 MG PO CAPS
300.0000 mg | ORAL_CAPSULE | Freq: Three times a day (TID) | ORAL | Status: DC
  Administered 2022-11-04 – 2022-11-09 (×15): 300 mg via ORAL
  Filled 2022-11-04 (×15): qty 1

## 2022-11-04 MED ORDER — ONDANSETRON HCL 4 MG/2ML IJ SOLN
INTRAMUSCULAR | Status: DC | PRN
  Administered 2022-11-04: 4 mg via INTRAVENOUS

## 2022-11-04 MED ORDER — ORAL CARE MOUTH RINSE: 15.0000 mL | Freq: Once | OROMUCOSAL | Status: AC

## 2022-11-04 MED ORDER — GUAIFENESIN-DM 100-10 MG/5ML PO SYRP
15.0000 mL | ORAL_SOLUTION | ORAL | Status: DC | PRN
  Administered 2022-11-08: 15 mL via ORAL
  Filled 2022-11-04: qty 20

## 2022-11-04 MED ORDER — ROCURONIUM BROMIDE 100 MG/10ML IV SOLN
INTRAVENOUS | Status: DC | PRN
  Administered 2022-11-04: 50 mg via INTRAVENOUS

## 2022-11-04 MED ORDER — ADULT MULTIVITAMIN W/MINERALS CH
ORAL_TABLET | Freq: Every day | ORAL | Status: DC
  Administered 2022-11-04 – 2022-11-09 (×5): 1 via ORAL
  Filled 2022-11-04 (×6): qty 1

## 2022-11-04 MED ORDER — DULOXETINE HCL 30 MG PO CPEP
60.0000 mg | ORAL_CAPSULE | Freq: Every day | ORAL | Status: DC
  Administered 2022-11-05 – 2022-11-09 (×5): 60 mg via ORAL
  Filled 2022-11-04 (×5): qty 2

## 2022-11-04 MED ORDER — FENTANYL CITRATE (PF) 100 MCG/2ML IJ SOLN
INTRAMUSCULAR | Status: DC | PRN
  Administered 2022-11-04 (×2): 50 ug via INTRAVENOUS

## 2022-11-04 MED ORDER — ACETAMINOPHEN 10 MG/ML IV SOLN
INTRAVENOUS | Status: DC | PRN
  Administered 2022-11-04: 1000 mg via INTRAVENOUS

## 2022-11-04 MED ORDER — ONDANSETRON HCL 4 MG/2ML IJ SOLN: 4.0000 mg | Freq: Four times a day (QID) | INTRAMUSCULAR | Status: DC | PRN

## 2022-11-04 MED ORDER — CHLORHEXIDINE GLUCONATE CLOTH 2 % EX PADS
6.0000 | MEDICATED_PAD | Freq: Once | CUTANEOUS | Status: DC
  Administered 2022-11-04: 6 via TOPICAL

## 2022-11-04 MED ORDER — LISINOPRIL 10 MG PO TABS
20.0000 mg | ORAL_TABLET | Freq: Every day | ORAL | Status: DC
  Administered 2022-11-04 – 2022-11-09 (×6): 20 mg via ORAL
  Filled 2022-11-04 (×6): qty 2

## 2022-11-04 MED ORDER — FENTANYL CITRATE (PF) 100 MCG/2ML IJ SOLN
25.0000 ug | INTRAMUSCULAR | Status: DC | PRN
  Administered 2022-11-04: 25 ug via INTRAVENOUS
  Administered 2022-11-04: 50 ug via INTRAVENOUS

## 2022-11-04 MED ORDER — OXYCODONE HCL 5 MG/5ML PO SOLN: 5.0000 mg | Freq: Once | ORAL | Status: DC | PRN

## 2022-11-04 MED ORDER — HYDROMORPHONE HCL 1 MG/ML IJ SOLN: 1.0000 mg | Freq: Once | INTRAMUSCULAR | Status: DC | PRN

## 2022-11-04 MED ORDER — ACETAMINOPHEN 500 MG PO TABS: 500.0000 mg | ORAL_TABLET | Freq: Four times a day (QID) | ORAL | Status: DC | PRN

## 2022-11-04 MED ORDER — ATORVASTATIN CALCIUM 10 MG PO TABS
10.0000 mg | ORAL_TABLET | Freq: Every day | ORAL | Status: DC
  Administered 2022-11-04 – 2022-11-09 (×6): 10 mg via ORAL
  Filled 2022-11-04 (×6): qty 1

## 2022-11-04 MED ORDER — 0.9 % SODIUM CHLORIDE (POUR BTL) OPTIME
TOPICAL | Status: DC | PRN
  Administered 2022-11-04: 1000 mL

## 2022-11-04 MED ORDER — FENTANYL CITRATE (PF) 100 MCG/2ML IJ SOLN
INTRAMUSCULAR | Status: AC
  Filled 2022-11-04: qty 2

## 2022-11-04 MED ORDER — APIXABAN 5 MG PO TABS
5.0000 mg | ORAL_TABLET | Freq: Two times a day (BID) | ORAL | Status: DC
  Administered 2022-11-05 – 2022-11-09 (×9): 5 mg via ORAL
  Filled 2022-11-04 (×9): qty 1

## 2022-11-04 MED ORDER — LORATADINE 10 MG PO TABS
10.0000 mg | ORAL_TABLET | Freq: Every day | ORAL | Status: DC
  Administered 2022-11-04 – 2022-11-09 (×6): 10 mg via ORAL
  Filled 2022-11-04 (×6): qty 1

## 2022-11-04 MED ORDER — LINAGLIPTIN 5 MG PO TABS
5.0000 mg | ORAL_TABLET | Freq: Every day | ORAL | Status: DC
  Administered 2022-11-04 – 2022-11-09 (×6): 5 mg via ORAL
  Filled 2022-11-04 (×6): qty 1

## 2022-11-04 MED ORDER — SUGAMMADEX SODIUM 200 MG/2ML IV SOLN
INTRAVENOUS | Status: DC | PRN
  Administered 2022-11-04: 200 mg via INTRAVENOUS

## 2022-11-04 MED ORDER — MIDAZOLAM HCL 2 MG/2ML IJ SOLN
INTRAMUSCULAR | Status: AC
  Filled 2022-11-04: qty 2

## 2022-11-04 MED ORDER — DIPHENHYDRAMINE HCL 50 MG/ML IJ SOLN
INTRAMUSCULAR | Status: DC | PRN
  Administered 2022-11-04: 12.5 mg via INTRAVENOUS

## 2022-11-04 MED ORDER — HYDROMORPHONE HCL 1 MG/ML IJ SOLN
INTRAMUSCULAR | Status: AC
  Filled 2022-11-04: qty 1

## 2022-11-04 MED ORDER — CIPROFLOXACIN IN D5W 400 MG/200ML IV SOLN: 400.0000 mg | INTRAVENOUS | Status: DC

## 2022-11-04 MED ORDER — HYDROMORPHONE HCL 1 MG/ML IJ SOLN
INTRAMUSCULAR | Status: DC | PRN
  Administered 2022-11-04: 1 mg via INTRAVENOUS

## 2022-11-04 MED ORDER — ONDANSETRON HCL 4 MG/2ML IJ SOLN
4.0000 mg | Freq: Four times a day (QID) | INTRAMUSCULAR | Status: DC | PRN
  Administered 2022-11-09: 4 mg via INTRAVENOUS
  Filled 2022-11-04: qty 2

## 2022-11-04 MED ORDER — DEXAMETHASONE SODIUM PHOSPHATE 10 MG/ML IJ SOLN
INTRAMUSCULAR | Status: DC | PRN
  Administered 2022-11-04: 5 mg via INTRAVENOUS

## 2022-11-04 MED ORDER — HYDROMORPHONE HCL 1 MG/ML IJ SOLN
1.0000 mg | INTRAMUSCULAR | Status: DC | PRN
  Administered 2022-11-04 – 2022-11-08 (×19): 1 mg via INTRAVENOUS
  Filled 2022-11-04 (×19): qty 1

## 2022-11-04 MED ORDER — PHENYLEPHRINE 80 MCG/ML (10ML) SYRINGE FOR IV PUSH (FOR BLOOD PRESSURE SUPPORT)
PREFILLED_SYRINGE | INTRAVENOUS | Status: AC
  Filled 2022-11-04: qty 10

## 2022-11-04 MED ORDER — FERROUS SULFATE 325 (65 FE) MG PO TABS
325.0000 mg | ORAL_TABLET | Freq: Every day | ORAL | Status: DC
  Administered 2022-11-04 – 2022-11-09 (×6): 325 mg via ORAL
  Filled 2022-11-04 (×6): qty 1

## 2022-11-04 MED ORDER — LABETALOL HCL 5 MG/ML IV SOLN: 10.0000 mg | INTRAVENOUS | Status: DC | PRN

## 2022-11-04 MED ORDER — PANTOPRAZOLE SODIUM 40 MG PO TBEC
40.0000 mg | DELAYED_RELEASE_TABLET | Freq: Every day | ORAL | Status: DC
  Administered 2022-11-05 – 2022-11-09 (×5): 40 mg via ORAL
  Filled 2022-11-04 (×5): qty 1

## 2022-11-04 MED ORDER — POTASSIUM CHLORIDE CRYS ER 20 MEQ PO TBCR: 20.0000 meq | EXTENDED_RELEASE_TABLET | Freq: Every day | ORAL | Status: DC | PRN

## 2022-11-04 MED ORDER — PHENYLEPHRINE HCL-NACL 20-0.9 MG/250ML-% IV SOLN
INTRAVENOUS | Status: DC | PRN
  Administered 2022-11-04: 25 ug/min via INTRAVENOUS

## 2022-11-04 MED ORDER — ACETAMINOPHEN 10 MG/ML IV SOLN
INTRAVENOUS | Status: AC
  Filled 2022-11-04: qty 100

## 2022-11-04 MED ORDER — CYCLOBENZAPRINE HCL 10 MG PO TABS
5.0000 mg | ORAL_TABLET | Freq: Three times a day (TID) | ORAL | Status: DC | PRN
  Administered 2022-11-04 – 2022-11-06 (×3): 5 mg via ORAL
  Filled 2022-11-04 (×3): qty 1

## 2022-11-04 MED ORDER — PROPOFOL 10 MG/ML IV BOLUS
INTRAVENOUS | Status: DC | PRN
Start: 2022-11-04 — End: 2022-11-04
  Administered 2022-11-04: 150 mg via INTRAVENOUS

## 2022-11-04 MED ORDER — PHENYLEPHRINE 80 MCG/ML (10ML) SYRINGE FOR IV PUSH (FOR BLOOD PRESSURE SUPPORT)
PREFILLED_SYRINGE | INTRAVENOUS | Status: DC | PRN
  Administered 2022-11-04: 160 ug via INTRAVENOUS

## 2022-11-04 MED ORDER — HYDROXYCHLOROQUINE SULFATE 200 MG PO TABS
200.0000 mg | ORAL_TABLET | Freq: Every day | ORAL | Status: DC
  Administered 2022-11-05 – 2022-11-09 (×5): 200 mg via ORAL
  Filled 2022-11-04 (×5): qty 1

## 2022-11-04 MED ORDER — SORBITOL 70 % SOLN: 30.0000 mL | Freq: Every day | Status: DC | PRN

## 2022-11-04 MED ORDER — PHENYLEPHRINE HCL (PRESSORS) 10 MG/ML IV SOLN
INTRAVENOUS | Status: AC
  Filled 2022-11-04: qty 1

## 2022-11-04 MED ORDER — CHLORHEXIDINE GLUCONATE CLOTH 2 % EX PADS: 6.0000 | MEDICATED_PAD | Freq: Once | CUTANEOUS | Status: DC

## 2022-11-04 MED ORDER — GLYCOPYRROLATE 0.2 MG/ML IJ SOLN
INTRAMUSCULAR | Status: DC | PRN
  Administered 2022-11-04: .2 mg via INTRAVENOUS

## 2022-11-04 MED ORDER — MAGNESIUM SULFATE 2 GM/50ML IV SOLN: 2.0000 g | Freq: Every day | INTRAVENOUS | Status: DC | PRN

## 2022-11-04 MED ORDER — FAMOTIDINE IN NACL 20-0.9 MG/50ML-% IV SOLN
20.0000 mg | Freq: Two times a day (BID) | INTRAVENOUS | Status: DC
  Administered 2022-11-04 – 2022-11-05 (×3): 20 mg via INTRAVENOUS
  Filled 2022-11-04 (×3): qty 50

## 2022-11-04 SURGICAL SUPPLY — 41 items
APL PRP STRL LF DISP 70% ISPRP (MISCELLANEOUS) ×1
BLADE SAGITTAL WIDE XTHICK NO (BLADE) ×1 IMPLANT
BLADE SAW SAG 25.4X90 (BLADE) IMPLANT
BNDG CMPR 5X4 CHSV STRCH STRL (GAUZE/BANDAGES/DRESSINGS) ×1
BNDG CMPR STD VLCR NS LF 5.8X6 (GAUZE/BANDAGES/DRESSINGS) ×1
BNDG COHESIVE 4X5 TAN STRL LF (GAUZE/BANDAGES/DRESSINGS) ×1 IMPLANT
BNDG ELASTIC 6X5.8 VLCR NS LF (GAUZE/BANDAGES/DRESSINGS) ×1 IMPLANT
BNDG GAUZE DERMACEA FLUFF 4 (GAUZE/BANDAGES/DRESSINGS) ×2 IMPLANT
BNDG GZE DERMACEA 4 6PLY (GAUZE/BANDAGES/DRESSINGS) ×2
BRUSH SCRUB EZ 4% CHG (MISCELLANEOUS) ×1 IMPLANT
CHLORAPREP W/TINT 26 (MISCELLANEOUS) ×1 IMPLANT
DRAPE INCISE IOBAN 66X45 STRL (DRAPES) ×1 IMPLANT
DRAPE INCISE IOBAN 66X60 STRL (DRAPES) ×1 IMPLANT
ELECT CAUTERY BLADE 6.4 (BLADE) ×1 IMPLANT
ELECT REM PT RETURN 9FT ADLT (ELECTROSURGICAL) ×1
ELECTRODE REM PT RTRN 9FT ADLT (ELECTROSURGICAL) ×1 IMPLANT
GAUZE XEROFORM 1X8 LF (GAUZE/BANDAGES/DRESSINGS) ×2 IMPLANT
GLOVE BIO SURGEON STRL SZ7 (GLOVE) ×2 IMPLANT
GOWN STRL REUS W/ TWL LRG LVL3 (GOWN DISPOSABLE) ×2 IMPLANT
GOWN STRL REUS W/ TWL XL LVL3 (GOWN DISPOSABLE) ×1 IMPLANT
GOWN STRL REUS W/TWL LRG LVL3 (GOWN DISPOSABLE) ×2
GOWN STRL REUS W/TWL XL LVL3 (GOWN DISPOSABLE) ×1
HANDLE YANKAUER SUCT BULB TIP (MISCELLANEOUS) ×1 IMPLANT
KIT TURNOVER KIT A (KITS) ×1 IMPLANT
LABEL OR SOLS (LABEL) ×1 IMPLANT
MANIFOLD NEPTUNE II (INSTRUMENTS) ×1 IMPLANT
MAT ABSORB FLUID 56X50 GRAY (MISCELLANEOUS) ×1 IMPLANT
NS IRRIG 1000ML POUR BTL (IV SOLUTION) ×1 IMPLANT
PACK EXTREMITY ARMC (MISCELLANEOUS) ×1 IMPLANT
PAD ABD DERMACEA PRESS 5X9 (GAUZE/BANDAGES/DRESSINGS) ×2 IMPLANT
PAD PREP OB/GYN DISP 24X41 (PERSONAL CARE ITEMS) ×1 IMPLANT
SPONGE T-LAP 18X18 ~~LOC~~+RFID (SPONGE) ×2 IMPLANT
STAPLER SKIN PROX 35W (STAPLE) ×1 IMPLANT
STOCKINETTE M/LG 89821 (MISCELLANEOUS) ×1 IMPLANT
SUT SILK 2 0 (SUTURE) ×1
SUT SILK 2 0 SH (SUTURE) ×2 IMPLANT
SUT SILK 2-0 18XBRD TIE 12 (SUTURE) ×1 IMPLANT
SUT VIC AB 0 CT1 36 (SUTURE) ×2 IMPLANT
SUT VIC AB 2-0 CT1 (SUTURE) ×2 IMPLANT
TRAP FLUID SMOKE EVACUATOR (MISCELLANEOUS) ×1 IMPLANT
WATER STERILE IRR 500ML POUR (IV SOLUTION) ×1 IMPLANT

## 2022-11-04 NOTE — Op Note (Signed)
  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  The patient cannot have a prosthesis due to her contracture.  This needs to be revised to an above-knee amputation.  The patient is scheduled for a left above-the-knee amputation.  I discussed in depth with the patient the risks, benefits, and alternatives to this procedure.  The patient is aware that the risk of this operation included but are not limited to:  bleeding, infection, myocardial infarction, stroke, death, failure to heal amputation wound, and possible need for more proximal amputation.  The patient is aware of the risks and agrees proceed forward with the procedure.  DESCRIPTION: After full informed written consent was obtained from the patient, the patient was taken to the operating room, and placed supine upon the operating table.  Prior to induction, the patient received IV antibiotics.  The patient was then prepped and draped in the standard fashion for a left above-the-knee amputation.  After obtaining adequate anesthesia, the patient was prepped and draped in the standard fashion for a above-the-knee amputation.  I marked out the anterior and posterior flaps for a fish-mouth type of amputation.  I made the incisions for these flaps, and then dissected through the subcutaneous tissue, fascia, and muscles circumferentially.  I elevated  the periosteal tissue 4-5 cm more proximal than the  anterior skin flap.  I then transected the femur with a power saw at this level.  Then I smoothed out the rough edges of the bone.  At this point, the specimen was passed off the field as the above-the-knee amputation.  At this point, I clamped all visibly bleeding arteries and veins using a combination of suture ligation with silk suture and electrocautery.   Bleeding continued to be controlled with electrocautery and suture ligature.  The stump was washed off with sterile normal saline and no further active bleeding was noted.  I reapproximated the anterior and posterior fascia  with interrupted stitches of 0 Vicryl.  This was completed along the entire length of anterior and posterior fascia until there were no more loose space in the fascial line. The subcutaneous tissue was then approximated with 2-0 vicryl sutures. The skin was then  reapproximated with staples.  The stump was washed off and dried.  The incision was dressed with Xeroform and ABD pads, and  then fluffs were applied.  Kerlix was wrapped around the leg and then gently an ACE wrap was applied.  A large Ioban was then placed over the ACE wrap to secure the dressing. The patient was then awakened and take to the recovery room in stable condition.   COMPLICATIONS: none  CONDITION: stable  [name]  11/04/2022, 8:51 AM   This note was created with Dragon Medical transcription system. Any errors in dictation are purely unintentional.

## 2022-11-04 NOTE — Transfer of Care (Signed)
Immediate Anesthesia Transfer of Care Note  Patient: Hydia Copelin  Procedure(s) Performed: AMPUTATION ABOVE KNEE (Left: Knee)  Patient Location: PACU  Anesthesia Type:General  Level of Consciousness: awake, drowsy, and patient cooperative  Airway & Oxygen Therapy: Patient Spontanous Breathing and Patient connected to face mask oxygen  Post-op Assessment: Report given to RN and Post -op Vital signs reviewed and stable  Post vital signs: Reviewed and stable  Last Vitals:  Vitals Value Taken Time  BP 145/76 11/04/22 0850  Temp 36.3 C 11/04/22 0850  Pulse 106 11/04/22 0859  Resp 15 11/04/22 0859  SpO2 100 % 11/04/22 0859  Vitals shown include unfiled device data.  Last Pain:  Vitals:   11/04/22 0628  TempSrc: Temporal  PainSc: 0-No pain         Complications: No notable events documented.

## 2022-11-04 NOTE — Anesthesia Postprocedure Evaluation (Signed)
Anesthesia Post Note  Patient: Sylvia [name]  Procedure(s) Performed: AMPUTATION ABOVE KNEE (Left: Knee)  Patient location during evaluation: PACU Anesthesia Type: General Level of consciousness: awake and alert Pain management: pain level controlled Vital Signs Assessment: post-procedure vital signs reviewed and stable Respiratory status: spontaneous breathing, nonlabored ventilation, respiratory function stable and patient connected to nasal cannula oxygen Cardiovascular status: blood pressure returned to baseline and stable Postop Assessment: no apparent nausea or vomiting Anesthetic complications: no   No notable events documented.   Last Vitals:  Vitals:   11/04/22 1000 11/04/22 1030  BP: (!) 162/81 (!) 187/87  Pulse: (!) 104 (!) 107  Resp: 13 14  Temp: 36.4 C 36.5 C  SpO2: 96% 99%    Last Pain:  Vitals:   11/04/22 1037  TempSrc:   PainSc: Asleep                 Corinda Gubler

## 2022-11-04 NOTE — Plan of Care (Signed)
  Problem: Education: Goal: Knowledge of the prescribed therapeutic regimen will improve Outcome: Progressing   Problem: Self-Care: Goal: Ability to meet self-care needs will improve Outcome: Progressing   Problem: Self-Concept: Goal: Ability to maintain and perform role responsibilities to the fullest extent possible will improve Outcome: Progressing   Problem: Pain Management: Goal: Pain level will decrease with appropriate interventions Outcome: Progressing   Problem: Skin Integrity: Goal: Demonstration of wound healing without infection will improve Outcome: Progressing

## 2022-11-04 NOTE — Interval H&P Note (Signed)
History and Physical Interval Note:  11/04/2022 7:23 AM  Sylvia [name]  has presented today for surgery, with the diagnosis of ISCHEMIC LEG.  The various methods of treatment have been discussed with the patient and family. After consideration of risks, benefits and other options for treatment, the patient has consented to  Procedure(s): AMPUTATION ABOVE KNEE (Left) as a surgical intervention.  The patient's history has been reviewed, patient examined, no change in status, stable for surgery.  I have reviewed the patient's chart and labs.  Questions were answered to the patient's satisfaction.     [name]

## 2022-11-04 NOTE — Anesthesia Procedure Notes (Signed)
Procedure Name: Intubation Date/Time: 11/04/2022 7:37 AM  Performed by: Stephanie Coup, MDPre-anesthesia Checklist: Patient identified, Emergency Drugs available, Suction available and Patient being monitored Patient Re-evaluated:Patient Re-evaluated prior to induction Oxygen Delivery Method: Circle system utilized Preoxygenation: Pre-oxygenation with 100% oxygen Induction Type: IV induction Ventilation: Oral airway inserted - appropriate to patient size Laryngoscope Size: McGraph and 3 Grade View: Grade I Tube type: Oral Tube size: 6.5 mm Number of attempts: 1 Airway Equipment and Method: Stylet and Oral airway Placement Confirmation: ETT inserted through vocal cords under direct vision, positive ETCO2, breath sounds checked- equal and bilateral and CO2 detector Secured at: 21 cm Tube secured with: Tape Dental Injury: Teeth and Oropharynx as per pre-operative assessment

## 2022-11-04 NOTE — Anesthesia Preprocedure Evaluation (Addendum)
Anesthesia Evaluation  Patient identified by MRN, date of birth, ID band Patient awake    Reviewed: Allergy & Precautions, NPO status , Patient's Chart, lab work & pertinent test results  History of Anesthesia Complications Negative for: history of anesthetic complications  Airway Mallampati: III  TM Distance: >3 FB Neck ROM: Full    Dental  (+) Edentulous Lower, Edentulous Upper   Pulmonary neg shortness of breath, COPD,  COPD inhaler, neg recent URI, Patient abstained from smoking., former smoker   Pulmonary exam normal breath sounds clear to auscultation       Cardiovascular Exercise Tolerance: Poor hypertension, (-) angina + CAD, + Peripheral Vascular Disease (s/p right BKA on Eliquis) and +CHF  (-) Past MI and (-) CABG Normal cardiovascular exam+ dysrhythmias  Rhythm:Regular Rate:Normal     Neuro/Psych  PSYCHIATRIC DISORDERS Anxiety Depression    Chronic pain related to both and prior RLE BKA negative neurological ROS  negative psych ROS   GI/Hepatic negative GI ROS, Neg liver ROS,GERD  Controlled and Medicated,,H/o DU   Endo/Other  diabetes, Poorly Controlled, Type 2    Renal/GU Renal disease     Musculoskeletal  (+) Arthritis , Rheumatoid disorders,  RA on MTX/plaquenil, pred chronically as well as TNF blocker    Abdominal Normal abdominal exam  (+)   Peds  Hematology negative hematology ROS (+) Blood dyscrasia, anemia   Anesthesia Other Findings Past Medical History: 05/16/2019: Anemia of chronic disease No date: Anxiety     Comment:  h/o No date: Arthritis No date: Chronic kidney disease No date: Cocaine abuse (HCC) No date: COPD (chronic obstructive pulmonary disease) (HCC) No date: Coronary artery disease No date: Depression No date: Diabetes mellitus without complication (HCC) No date: DKA (diabetic ketoacidosis) (HCC) No date: Elevated troponin No date: GERD (gastroesophageal reflux  disease) No date: GI bleed No date: Hypertension     Comment:  bp under control-off meds since 2019 No date: Ischemic leg  Past Surgical History: No date: ABDOMINAL HYSTERECTOMY 12/27/2018: AMPUTATION; Right     Comment:  Procedure: AMPUTATION BELOW KNEE;  Surgeon: Annice Needy, MD;  Location: [facility] ORS;  Service: General;                Laterality: Right; 11/05/2021: AMPUTATION; Left     Comment:  Procedure: AMPUTATION BELOW KNEE;  Surgeon: Annice Needy, MD;  Location: [facility] ORS;  Service: Vascular;                Laterality: Left; 10/16/2021: APPLICATION OF WOUND VAC     Comment:  Procedure: APPLICATION OF WOUND VAC;  Surgeon: Annice Needy, MD;  Location: [facility] ORS;  Service: Vascular;; 03/27/2020: CATARACT EXTRACTION W/PHACO; Right     Comment:  Procedure: CATARACT EXTRACTION PHACO AND INTRAOCULAR               LENS PLACEMENT (IOC) RIGHT DIABETIC 7.54 00:52.5;                Surgeon: Galen Manila, MD;  Location: [facility]              CNTR;  Service: Ophthalmology;  Laterality: Right; 05/20/2020: CATARACT EXTRACTION W/PHACO; Left     Comment:  Procedure: CATARACT EXTRACTION PHACO AND INTRAOCULAR  LENS PLACEMENT (IOC) LEFT DIABETIC 4.95 00:37.6;                Surgeon: Galen Manila, MD;  Location: [facility]              CNTR;  Service: Ophthalmology;  Laterality: Left;                Diabetic - oral meds COVID + 04-24-20 No date: CHOLECYSTECTOMY 04/16/2019: COLONOSCOPY WITH PROPOFOL; N/A     Comment:  Procedure: COLONOSCOPY WITH PROPOFOL;  Surgeon:               Pasty Spillers, MD;  Location: [facility] END[facility];                Service: Endoscopy;  Laterality: N/A; 01/16/2020: COLONOSCOPY WITH PROPOFOL; N/A     Comment:  Procedure: COLONOSCOPY WITH PROPOFOL;  Surgeon:               Pasty Spillers, MD;  Location: [facility] END[facility];                Service: Endoscopy;  Laterality: N/A; 10/16/2021:  ENDARTERECTOMY FEMORAL; Left     Comment:  Procedure: ENDARTERECTOMY FEMORAL;  Surgeon: Annice Needy, MD;  Location: [facility] ORS;  Service: Vascular;                Laterality: Left; 06/15/2018: ESOPHAGOGASTRODUODENOSCOPY (EGD) WITH PROPOFOL; N/A     Comment:  Procedure: ESOPHAGOGASTRODUODENOSCOPY (EGD) WITH               PROPOFOL;  Surgeon: Toledo, Boykin Nearing, MD;  Location:               [facility] END[facility];  Service: Gastroenterology;  Laterality:               N/A; 01/03/2019: ESOPHAGOGASTRODUODENOSCOPY (EGD) WITH PROPOFOL; N/A     Comment:  Procedure: ESOPHAGOGASTRODUODENOSCOPY (EGD) WITH               PROPOFOL;  Surgeon: Midge Minium, MD;  Location: [facility]               ENDOSCOPY;  Service: Endoscopy;  Laterality: N/A; 09/25/2021: ESOPHAGOGASTRODUODENOSCOPY (EGD) WITH PROPOFOL; N/A     Comment:  Procedure: ESOPHAGOGASTRODUODENOSCOPY (EGD) WITH               PROPOFOL;  Surgeon: Midge Minium, MD;  Location: [facility]               ENDOSCOPY;  Service: Endoscopy;  Laterality: N/A; No date: EYE SURGERY; Bilateral 04/16/2019: GIVENS CAPSULE STUDY     Comment:  Procedure: GIVENS CAPSULE STUDY;  Surgeon: Pasty Spillers, MD;  Location: [facility] END[facility];  Service:               Endoscopy;; 06/25/2019: GIVENS CAPSULE STUDY; N/A     Comment:  Procedure: GIVENS CAPSULE STUDY;  Surgeon: Wyline Mood,               MD;  Location: [facility] END[facility];  Service:               Gastroenterology;  Laterality: N/A; 02/24/2022: INCISION AND DRAINAGE OF WOUND; Left     Comment:  Procedure: LEFT BKA IRRIGATION AND DEBRIDEMENT WOUND;                Surgeon:  Annice Needy, MD;  Location: [facility] ORS;  Service:              Vascular;  Laterality: Left; 08/21/2018: LOWER EXTREMITY ANGIOGRAPHY; Right     Comment:  Procedure: LOWER EXTREMITY ANGIOGRAPHY;  Surgeon: Annice Needy, MD;  Location: [facility];  Service:               Cardiovascular;  Laterality: Right; 08/28/2018:  LOWER EXTREMITY ANGIOGRAPHY; Left     Comment:  Procedure: LOWER EXTREMITY ANGIOGRAPHY;  Surgeon: Annice Needy, MD;  Location: [facility];  Service:               Cardiovascular;  Laterality: Left; 08/28/2018: LOWER EXTREMITY ANGIOGRAPHY; Right     Comment:  Procedure: Lower Extremity Angiography;  Surgeon: Annice Needy, MD;  Location: [facility];  Service:               Cardiovascular;  Laterality: Right; 12/18/2018: LOWER EXTREMITY ANGIOGRAPHY; Right     Comment:  Procedure: Lower Extremity Angiography;  Surgeon: Annice Needy, MD;  Location: [facility];  Service:               Cardiovascular;  Laterality: Right; 12/18/2018: LOWER EXTREMITY ANGIOGRAPHY; Right     Comment:  Procedure: Lower Extremity Angiography;  Surgeon: Annice Needy, MD;  Location: [facility];  Service:               Cardiovascular;  Laterality: Right; 12/21/2018: LOWER EXTREMITY ANGIOGRAPHY; Left     Comment:  Procedure: Lower Extremity Angiography;  Surgeon: Annice Needy, MD;  Location: [facility];  Service:               Cardiovascular;  Laterality: Left; 12/21/2018: LOWER EXTREMITY ANGIOGRAPHY; Right     Comment:  Procedure: Lower Extremity Angiography;  Surgeon: Annice Needy, MD;  Location: [facility];  Service:               Cardiovascular;  Laterality: Right; 12/25/2020: LOWER EXTREMITY ANGIOGRAPHY; Left     Comment:  Procedure: LOWER EXTREMITY ANGIOGRAPHY;  Surgeon: Annice Needy, MD;  Location: [facility];  Service:               Cardiovascular;  Laterality: Left; 09/21/2021: LOWER EXTREMITY ANGIOGRAPHY; Left     Comment:  Procedure: Lower Extremity Angiography;  Surgeon: Annice Needy, MD;  Location: [facility];  Service:               Cardiovascular;  Laterality: Left; 10/13/2021: LOWER EXTREMITY ANGIOGRAPHY; Left      Comment:  Procedure: Lower Extremity Angiography;  Surgeon:  Schnier, Latina Craver, MD;  Location: [facility];               Service: Cardiovascular;  Laterality: Left; 10/14/2021: LOWER EXTREMITY ANGIOGRAPHY; Left     Comment:  Procedure: Lower Extremity Angiography;  Surgeon: Annice Needy, MD;  Location: [facility];  Service:               Cardiovascular;  Laterality: Left; 11/02/2021: LOWER EXTREMITY ANGIOGRAPHY; Left     Comment:  Procedure: Lower Extremity Angiography;  Surgeon: Annice Needy, MD;  Location: [facility];  Service:               Cardiovascular;  Laterality: Left; 12/22/2018: LOWER EXTREMITY INTERVENTION; N/A     Comment:  Procedure: LOWER EXTREMITY INTERVENTION;  Surgeon: Annice Needy, MD;  Location: [facility];  Service:               Cardiovascular;  Laterality: N/A; 12/26/2020: LOWER EXTREMITY INTERVENTION; Left     Comment:  Procedure: LOWER EXTREMITY INTERVENTION;  Surgeon: Annice Needy, MD;  Location: [facility];  Service:               Cardiovascular;  Laterality: Left; 09/22/2021: LOWER EXTREMITY INTERVENTION; N/A     Comment:  Procedure: LOWER EXTREMITY INTERVENTION;  Surgeon: Annice Needy, MD;  Location: [facility];  Service:               Cardiovascular;  Laterality: N/A;  BMI    Body Mass Index: 20.51 kg/m      Reproductive/Obstetrics negative OB ROS                             Anesthesia Physical Anesthesia Plan  ASA: 3  Anesthesia Plan: General ETT and General   Post-op Pain Management:    Induction: Intravenous  PONV Risk Score and Plan: 1 and Ondansetron, Midazolam and Dexamethasone  Airway Management Planned: Oral ETT  Additional Equipment:   Intra-op Plan:   Post-operative Plan: Extubation in OR  Informed Consent: I have reviewed the patients History and Physical,  chart, labs and discussed the procedure including the risks, benefits and alternatives for the proposed anesthesia with the patient or authorized representative who has indicated his/her understanding and acceptance.     Dental Advisory Given  Plan Discussed with: Anesthesiologist, CRNA and Surgeon  Anesthesia Plan Comments: (Patient consented for risks of anesthesia including but not limited to:  - adverse reactions to medications - damage to eyes, teeth, lips or other oral mucosa - nerve damage due to positioning  - sore throat or hoarseness - Damage to heart, brain, nerves, lungs, other parts of body or loss of life  Patient voiced understanding.)        Anesthesia Quick Evaluation

## 2022-11-05 LAB — CBC
HCT: 20.8 % — ABNORMAL LOW (ref 36.0–46.0)
Hemoglobin: 7.1 g/dL — ABNORMAL LOW (ref 12.0–15.0)
MCH: 28.5 pg (ref 26.0–34.0)
MCHC: 34.1 g/dL (ref 30.0–36.0)
MCV: 83.5 fL (ref 80.0–100.0)
Platelets: 274 10*3/uL (ref 150–400)
RBC: 2.49 MIL/uL — ABNORMAL LOW (ref 3.87–5.11)
RDW: 14.5 % (ref 11.5–15.5)
WBC: 12.9 10*3/uL — ABNORMAL HIGH (ref 4.0–10.5)
nRBC: 0 % (ref 0.0–0.2)

## 2022-11-05 LAB — BASIC METABOLIC PANEL
Anion gap: 8 (ref 5–15)
BUN: 14 mg/dL (ref 8–23)
CO2: 21 mmol/L — ABNORMAL LOW (ref 22–32)
Calcium: 8.9 mg/dL (ref 8.9–10.3)
Chloride: 112 mmol/L — ABNORMAL HIGH (ref 98–111)
Creatinine, Ser: 0.88 mg/dL (ref 0.44–1.00)
GFR, Estimated: >60 mL/min (ref >60)
Glucose, Bld: 117 mg/dL — ABNORMAL HIGH (ref 70–99)
Potassium: 3.7 mmol/L (ref 3.5–5.1)
Sodium: 141 mmol/L (ref 135–145)

## 2022-11-05 NOTE — Progress Notes (Signed)
  Progress Note    11/05/2022 9:04 AM 1 Day Post-Op  Subjective:  Sylvia [name] is a 67 yo female who is now POD #1 from Left Below the knee amputation. Patient is resting comfortably. Patient endorses pain to her left lower extremity. Dressing remains intact. No drainage to note.     Vitals:   11/04/22 1515 11/05/22 0002  BP: 131/76 (!) 161/82  Pulse: 97 (!) 103  Resp: 16 18  Temp: 97.8 F (36.6 C) 97.7 F (36.5 C)  SpO2: 96% 97%   Physical Exam: Cardiac:  RRR, Normal S1, S2, No murmurs.  Lungs:  Normal respiratory effort. Clear throughout but diminished in the bases.  Incisions:  Left Below Knee amputation with dressing clean dry and intact.  Extremities:  Bilateral Below the knee amputations.  Abdomen:  Positive bowel sounds, soft, non tender and non distended.  Neurologic: AAOX3 and follows all commands.   CBC    Component Value Date/Time   WBC 8.6 11/01/2022 1050   RBC 3.46 (L) 11/01/2022 1050   HGB 9.7 (L) 11/01/2022 1050   HGB 8.1 (L) 05/09/2019 1539   HCT 27.9 (L) 11/01/2022 1050   HCT 24.6 (L) 05/09/2019 1539   PLT 454 (H) 11/01/2022 1050   PLT 470 (H) 05/09/2019 1539   MCV 80.6 11/01/2022 1050   MCV 88 05/09/2019 1539   MCH 28.0 11/01/2022 1050   MCHC 34.8 11/01/2022 1050   RDW 14.8 11/01/2022 1050   RDW 14.0 05/09/2019 1539   LYMPHSABS 2.0 11/01/2022 1050   MONOABS 0.8 11/01/2022 1050   EOSABS 0.2 11/01/2022 1050   BASOSABS 0.1 11/01/2022 1050    BMET    Component Value Date/Time   NA 141 11/01/2022 1050   K 4.0 11/01/2022 1050   CL 109 11/01/2022 1050   CO2 23 11/01/2022 1050   GLUCOSE 168 (H) 11/01/2022 1050   BUN 19 11/01/2022 1050   CREATININE 1.20 (H) 11/01/2022 1050   CALCIUM 9.7 11/01/2022 1050   GFRNONAA 50 (L) 11/01/2022 1050   GFRAA >60 11/23/2019 1505    INR    Component Value Date/Time   INR 1.1 02/23/2022 0345     Intake/Output Summary (Last 24 hours) at 11/05/2022 0904 Last data filed at 11/04/2022 1534 Gross per  24 hour  Intake 612.72 ml  Output --  Net 612.72 ml     Assessment/Plan:  67 y.o. female is s/p Left Below the knee amputation.  1 Day Post-Op   PLAN: Advance diet as tolerated.  PT/OT First Dressing change by Vascular Surgery on Sunday 11/07/22 Recommend Rehab placement now that patient is Bilateral lower extremity amputation.   DVT prophylaxis:  Eliquis 5 mg BID   Marcie [facility] 11/05/2022 9:04 AM

## 2022-11-05 NOTE — Plan of Care (Signed)
  Problem: Education: Goal: Knowledge of the prescribed therapeutic regimen will improve Outcome: Progressing   Problem: Self-Care: Goal: Ability to meet self-care needs will improve Outcome: Progressing   Problem: Self-Concept: Goal: Ability to maintain and perform role responsibilities to the fullest extent possible will improve Outcome: Progressing   Problem: Pain Management: Goal: Pain level will decrease with appropriate interventions Outcome: Progressing   Problem: Skin Integrity: Goal: Demonstration of wound healing without infection will improve Outcome: Progressing   Problem: Nutrition: Goal: Adequate nutrition will be maintained Outcome: Progressing   Problem: Skin Integrity: Goal: Risk for impaired skin integrity will decrease Outcome: Progressing

## 2022-11-05 NOTE — Evaluation (Signed)
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 Patient is modified independent at baseline with transfers from bed to wheelchair without assistance. She has a prosthesis for RLE that she reports wearing when going into the community.  Today the patient required no physical assistance with long sitting and repositioning in bed. She was able to perform posterior scoot using upper body strength with supervision. Patient declined getting up to chair due to pain but would require at least set-up of environment. Educated patient on positioning of residual limb to prevent against hip contracture. Recommend to continue PT to maximize independence and facilitate return to prior level of function.       Assistance Recommended at Discharge PRN  If plan is discharge home, recommend the following:  Can travel by private vehicle  Assistance with cooking/housework;Help with stairs or ramp for entrance;Assist for transportation        Equipment Recommendations None recommended by PT  Recommendations for Other Services       Functional Status Assessment Patient has had a recent decline in their functional status and demonstrates the ability to make significant improvements in function in a reasonable and predictable amount of time.     Precautions / Restrictions Precautions Precautions: Fall Restrictions Weight Bearing Restrictions: No      Mobility  Bed Mobility Overal bed mobility: Modified Independent             General bed mobility comments: for long sitting (occasional use of bed rails for support and increased time required)    Transfers Overall transfer level: Needs assistance Equipment used: None            Anterior-Posterior transfers: Supervision   General transfer comment: patient able to scoot posteriorly in bed in long sitting position x 2 bouts without difficulty. patient declined getting up to the recliner chair (with drop arm function) due to pain. encouraged patient to long sit in bed periodically for pulmonary hygiene as patient complains of mild coughing    Ambulation/Gait                  Stairs            Wheelchair Mobility     Tilt Bed    Modified Rankin (Stroke Patients Only)       Balance Overall balance assessment: Independent (for long sitting)                                           Pertinent Vitals/Pain Pain Assessment Pain Assessment: Faces Faces Pain Scale: Hurts whole lot Pain Location: L amputation site Pain Descriptors / Indicators: Grimacing, Guarding Pain Intervention(s): Limited activity within patient's tolerance, Repositioned, Monitored during session    Home Living Family/patient expects to be discharged to:: Private residence Living Arrangements: Children   Type of Home: House Home Access: Ramped entrance         Home Equipment: BSC/3in1;Wheelchair - Engineer, technical sales - power      Prior Function Prior Level of Function : Independent/Modified Independent             Mobility Comments: lateral scoot to wheelchair. independent at  wheelchiar level. has a prosthesis on RLE that she wears for going out in the community ADLs Comments: supervision for transfer on shower chair     Hand Dominance        Extremity/Trunk Assessment   Upper Extremity Assessment Upper Extremity Assessment: Overall WFL for tasks assessed (patient does report chronic R shoulder pain)    Lower Extremity Assessment Lower Extremity Assessment: LLE deficits/detail;RLE deficits/detail RLE Deficits / Details: knee ROM appears WFL. hip to neutral (did not assess extension) LLE Deficits / Details: patient can SLR  and activate hip add/abd. patient reports pain in the hip with lying flat       Communication   Communication: No difficulties  Cognition Arousal/Alertness: Awake/alert Behavior During Therapy: WFL for tasks assessed/performed Overall Cognitive Status: Within Functional Limits for tasks assessed                                          General Comments General comments (skin integrity, edema, etc.): patient educated on positioning of residual limb to prevent against hip contracture.    Exercises     Assessment/Plan    PT Assessment Patient needs continued PT services  PT Problem List Decreased strength;Decreased activity tolerance;Decreased range of motion;Decreased balance;Decreased mobility       PT Treatment Interventions DME instruction;Functional mobility training;Therapeutic exercise;Therapeutic activities;Balance training;Neuromuscular re-education;Patient/family education;Wheelchair mobility training    PT Goals (Current goals can be found in the Care Plan section)  Acute Rehab PT Goals Patient Stated Goal: pain control PT Goal Formulation: With patient Time For Goal Achievement: 11/19/22 Potential to Achieve Goals: Good Additional Goals Additional Goal #1: Mod I, 168ft without cues for safety in preparation for home and community mobility    Frequency Min 1X/week     Co-evaluation               AM-PAC PT "6 Clicks" Mobility  Outcome Measure Help needed turning from your back to your side while in a flat bed without using bedrails?: None Help needed moving from lying on your back to sitting on the side of a flat bed without using bedrails?: None Help needed moving to and from a bed to a chair (including a wheelchair)?: A Little Help needed standing up from a chair using your arms (e.g., wheelchair or bedside chair)?: A Little Help needed to walk in hospital room?: A Little Help needed climbing 3-5 steps with a railing? : A Little 6  Click Score: 20    End of Session   Activity Tolerance: Patient tolerated treatment well Patient left: in bed;with call bell/phone within reach;with bed alarm set   PT Visit Diagnosis: Muscle weakness (generalized) (M62.81);Pain Pain - Right/Left: Left Pain - part of body: Leg    Time: 1133-1150 PT Time Calculation (min) (ACUTE ONLY): 17 min   Charges:   PT Evaluation $PT Eval Low Complexity: 1 Low PT Treatments $Therapeutic Activity: 8-22 mins PT General Charges $$ ACUTE PT VISIT: 1 Visit         Donna Bernard, PT, MPT   Ina Homes 11/05/2022, 1:23 PM

## 2022-11-05 NOTE — Evaluation (Signed)
Occupational Therapy Evaluation Patient Details Name: Sylvia [name] MRN: [id number] DOB: [birth year] Today's Date: 11/05/2022   History of Present Illness Sylvia [name] is a 67 y.o. 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  The patient cannot have a prosthesis due to her contracture. S/p L AKA.   Clinical Impression   [name] was seen for OT evaluation this date. Prior to hospital admission, pt was MOD I for w/c transfers. Pt currently requires SETUP for anterior/posterior BSC t/f, assist to stabilize BSC. MOD I don/doff underwear long sitting at bed level and pericare sitting on BSC. Pt reports 10/10 pain, RN in during session to address. Pt would benefit from IT follow up to manage pain.  Pt would benefit from skilled OT to address noted impairments and functional limitations (see below for any additional details). Upon hospital discharge, recommend no OT follow up.    Recommendations for follow up therapy are one component of a multi-disciplinary discharge planning process, led by the attending physician.  Recommendations may be updated based on patient status, additional functional criteria and insurance authorization.   Assistance Recommended at Discharge Intermittent Supervision/Assistance  Patient can return home with the following      Functional Status Assessment  Patient has had a recent decline in their functional status and demonstrates the ability to make significant improvements in function in a reasonable and predictable amount of time.  Equipment Recommendations  BSC/3in1    Recommendations for Other Services       Precautions / Restrictions Precautions Precautions: Fall Restrictions Weight Bearing Restrictions: No      Mobility Bed Mobility Overal bed mobility: Independent                  Transfers Overall transfer  level: Needs assistance   Transfers: Bed to chair/wheelchair/BSC         Anterior-Posterior transfers: Supervision          Balance Overall balance assessment: Independent                                         ADL either performed or assessed with clinical judgement   ADL Overall ADL's : Needs assistance/impaired                                       General ADL Comments: SETUP for BSC t/f, stabilized BSC for anterior/posterior. MOD I don/doff underwear long sitting at bed level      Pertinent Vitals/Pain Pain Assessment Pain Assessment: 0-10 Pain Score: 10-Worst pain ever Pain Location: L amputation site Pain Descriptors / Indicators: Constant, Grimacing Pain Intervention(s): Limited activity within patient's tolerance, Patient requesting pain meds-RN notified, RN gave pain meds during session     Hand Dominance     Extremity/Trunk Assessment Upper Extremity Assessment Upper Extremity Assessment: Overall WFL for tasks assessed   Lower Extremity Assessment Lower Extremity Assessment: Overall WFL for tasks assessed (B AKA)       Communication Communication Communication: No difficulties   Cognition Arousal/Alertness: Awake/alert Behavior During Therapy: WFL for tasks assessed/performed Overall Cognitive Status: Within Functional Limits for tasks assessed  Home Living Family/patient expects to be discharged to:: Private residence Living Arrangements: Children   Type of Home: House Home Access: Ramped entrance ("raggedy piece of a ramp")                     Home Equipment: BSC/3in1;Wheelchair - manual          Prior Functioning/Environment Prior Level of Function : Independent/Modified Independent             Mobility Comments: reports w/c use          OT Problem List: Decreased activity tolerance      OT  Treatment/Interventions: Self-care/ADL training;Therapeutic exercise;Energy conservation;DME and/or AE instruction;Therapeutic activities;Balance training;Patient/family education    OT Goals(Current goals can be found in the care plan section) Acute Rehab OT Goals Patient Stated Goal: to improve pain OT Goal Formulation: With patient/family Time For Goal Achievement:  (w+2) Potential to Achieve Goals: Good ADL Goals Pt Will Transfer to Toilet: Independently;anterior/posterior transfer;bedside commode Additional ADL Goal #1: Tolerate 10 mins seated grooming tasks with no cues to utilize pain management techniques  OT Frequency: Min 1X/week    Co-evaluation              AM-PAC OT "6 Clicks" Daily Activity     Outcome Measure Help from another person eating meals?: None Help from another person taking care of personal grooming?: None Help from another person toileting, which includes using toliet, bedpan, or urinal?: A Little Help from another person bathing (including washing, rinsing, drying)?: None Help from another person to put on and taking off regular upper body clothing?: None Help from another person to put on and taking off regular lower body clothing?: None 6 Click Score: 23   End of Session    Activity Tolerance: Patient tolerated treatment well;Patient limited by pain Patient left: in bed;with call bell/phone within reach;with family/visitor present;with nursing/sitter in room  OT Visit Diagnosis: Unsteadiness on feet (R26.81)                Time: 0272-5366 OT Time Calculation (min): 23 min Charges:  OT General Charges $OT Visit: 1 Visit OT Evaluation $OT Eval Moderate Complexity: 1 Mod OT Treatments $Self Care/Home Management : 8-22 mins  Kathie Dike, M.S. OTR/L  11/05/22, 9:53 AM  ascom [phone]

## 2022-11-06 LAB — BASIC METABOLIC PANEL
Anion gap: 5 (ref 5–15)
BUN: 13 mg/dL (ref 8–23)
CO2: 23 mmol/L (ref 22–32)
Calcium: 8.7 mg/dL — ABNORMAL LOW (ref 8.9–10.3)
Chloride: 112 mmol/L — ABNORMAL HIGH (ref 98–111)
Creatinine, Ser: 0.93 mg/dL (ref 0.44–1.00)
GFR, Estimated: >60 mL/min (ref >60)
Glucose, Bld: 124 mg/dL — ABNORMAL HIGH (ref 70–99)
Potassium: 3.6 mmol/L (ref 3.5–5.1)
Sodium: 140 mmol/L (ref 135–145)

## 2022-11-06 LAB — HEMOGLOBIN AND HEMATOCRIT, BLOOD
HCT: 24.7 % — ABNORMAL LOW (ref 36.0–46.0)
Hemoglobin: 8.4 g/dL — ABNORMAL LOW (ref 12.0–15.0)

## 2022-11-06 LAB — BPAM RBC
Blood Product Expiration Date: 202408242359
ISSUE DATE / TIME: 202407200932

## 2022-11-06 LAB — CBC
HCT: 19.4 % — ABNORMAL LOW (ref 36.0–46.0)
Hemoglobin: 6.6 g/dL — ABNORMAL LOW (ref 12.0–15.0)
MCH: 28.1 pg (ref 26.0–34.0)
MCHC: 34 g/dL (ref 30.0–36.0)
MCV: 82.6 fL (ref 80.0–100.0)
Platelets: 276 10*3/uL (ref 150–400)
RBC: 2.35 MIL/uL — ABNORMAL LOW (ref 3.87–5.11)
RDW: 14.4 % (ref 11.5–15.5)
WBC: 10.8 10*3/uL — ABNORMAL HIGH (ref 4.0–10.5)
nRBC: 0 % (ref 0.0–0.2)

## 2022-11-06 LAB — TYPE AND SCREEN
Antibody Screen: NEGATIVE
Unit division: 0

## 2022-11-06 LAB — PREPARE RBC (CROSSMATCH)

## 2022-11-06 MED ORDER — SODIUM CHLORIDE 0.9% IV SOLUTION: Freq: Once | INTRAVENOUS | Status: AC

## 2022-11-06 MED ORDER — ENSURE ENLIVE PO LIQD
237.0000 mL | Freq: Two times a day (BID) | ORAL | Status: DC
  Administered 2022-11-06 – 2022-11-09 (×5): 237 mL via ORAL

## 2022-11-06 NOTE — Plan of Care (Signed)
  Problem: Education: Goal: Knowledge of the prescribed therapeutic regimen will improve Outcome: Progressing   Problem: Pain Management: Goal: Pain level will decrease with appropriate interventions Outcome: Progressing   Problem: Health Behavior/Discharge Planning: Goal: Ability to manage health-related needs will improve Outcome: Progressing   Problem: Clinical Measurements: Goal: Diagnostic test results will improve Outcome: Progressing   Problem: Activity: Goal: Risk for activity intolerance will decrease Outcome: Progressing   Problem: Nutrition: Goal: Adequate nutrition will be maintained Outcome: Progressing   Problem: Coping: Goal: Level of anxiety will decrease Outcome: Progressing   Problem: Elimination: Goal: Will not experience complications related to bowel motility Outcome: Progressing Goal: Will not experience complications related to urinary retention Outcome: Progressing   Problem: Pain Managment: Goal: General experience of comfort will improve Outcome: Progressing

## 2022-11-06 NOTE — Progress Notes (Signed)
2 Days Post-Op   Subjective/Chief Complaint: Complains of LEFT AKA Stump pain.   Objective: Vital signs in last 24 hours:   Last BM Date : 11/04/22  Intake/Output from previous day: 07/19 0701 - 07/20 0700 In: -  Out: 650 [Urine:650] Intake/Output this shift: No intake/output data recorded.  General appearance: mild distress Cardio: regular rate and rhythm GI: soft, non-tender; bowel sounds normal; no masses,  no organomegaly Extremities: LEFT AKA dressing removed- incision- C/D/I, stump soft, tender, scant bleeding along incision line and on dressing  Lab Results:  Recent Labs    11/05/22 0903 11/06/22 0439  WBC 12.9* 10.8*  HGB 7.1* 6.6*  HCT 20.8* 19.4*  PLT 274 276   BMET Recent Labs    11/05/22 0903 11/06/22 0439  NA 141 140  K 3.7 3.6  CL 112* 112*  CO2 21* 23  GLUCOSE 117* 124*  BUN 14 13  CREATININE 0.88 0.93  CALCIUM 8.9 8.7*   PT/INR No results for input(s): "LABPROT", "INR" in the last 72 hours. ABG No results for input(s): "PHART", "HCO3" in the last 72 hours.  Invalid input(s): "PCO2", "PO2"  Studies/Results: No results found.  Anti-infectives: Anti-infectives (From admission, onward)    Start     Dose/Rate Route Frequency Ordered Stop   11/05/22 1000  hydroxychloroquine (PLAQUENIL) tablet 200 mg        200 mg Oral Daily 11/04/22 1040     11/04/22 0600  ciprofloxacin (CIPRO) IVPB 400 mg  Status:  Discontinued        400 mg 200 mL/hr over 60 Minutes Intravenous On call to O.R. 11/04/22 0014 11/04/22 1034       Assessment/Plan: s/p Procedure(s): AMPUTATION ABOVE KNEE (Left) Transfuse 2 U pRBC Bedrest today Ensure BID- encourage PO Monitor stump  LOS: 2 days    Eli Hose A 11/06/2022

## 2022-11-06 NOTE — Progress Notes (Signed)
Only 1 unit pRBC was given this morning. Per Dr. Evie Lacks, if H&H is above 8, no need to transfuse 2nd unit of pRBC.  Hemoglobin 8.4 Hematocrit  24.7

## 2022-11-07 LAB — CBC
HCT: 24.2 % — ABNORMAL LOW (ref 36.0–46.0)
Hemoglobin: 8.3 g/dL — ABNORMAL LOW (ref 12.0–15.0)
MCH: 28.3 pg (ref 26.0–34.0)
MCHC: 34.3 g/dL (ref 30.0–36.0)
MCV: 82.6 fL (ref 80.0–100.0)
Platelets: 288 10*3/uL (ref 150–400)
RBC: 2.93 MIL/uL — ABNORMAL LOW (ref 3.87–5.11)
RDW: 14.4 % (ref 11.5–15.5)
WBC: 10.5 10*3/uL (ref 4.0–10.5)
nRBC: 0 % (ref 0.0–0.2)

## 2022-11-07 LAB — TYPE AND SCREEN: ABO/RH(D): O POS

## 2022-11-07 LAB — COMPREHENSIVE METABOLIC PANEL
ALT: 10 U/L (ref 0–44)
AST: 12 U/L — ABNORMAL LOW (ref 15–41)
Albumin: 3.1 g/dL — ABNORMAL LOW (ref 3.5–5.0)
Alkaline Phosphatase: 80 U/L (ref 38–126)
Anion gap: 5 (ref 5–15)
BUN: 18 mg/dL (ref 8–23)
CO2: 23 mmol/L (ref 22–32)
Calcium: 8.8 mg/dL — ABNORMAL LOW (ref 8.9–10.3)
Chloride: 110 mmol/L (ref 98–111)
Creatinine, Ser: 0.96 mg/dL (ref 0.44–1.00)
GFR, Estimated: >60 mL/min (ref >60)
Glucose, Bld: 137 mg/dL — ABNORMAL HIGH (ref 70–99)
Potassium: 3.5 mmol/L (ref 3.5–5.1)
Sodium: 138 mmol/L (ref 135–145)
Total Bilirubin: 0.7 mg/dL (ref 0.3–1.2)
Total Protein: 6.4 g/dL — ABNORMAL LOW (ref 6.5–8.1)

## 2022-11-07 LAB — BPAM RBC: Unit Type and Rh: 5100

## 2022-11-07 NOTE — Plan of Care (Signed)
  Problem: Activity: Goal: Ability to perform//tolerate increased activity and mobilize with assistive devices will improve Outcome: Progressing   Problem: Clinical Measurements: Goal: Postoperative complications will be avoided or minimized Outcome: Progressing   Problem: Pain Management: Goal: Pain level will decrease with appropriate interventions Outcome: Progressing   Problem: Skin Integrity: Goal: Demonstration of wound healing without infection will improve Outcome: Progressing   Problem: Clinical Measurements: Goal: Will remain free from infection Outcome: Progressing

## 2022-11-07 NOTE — Progress Notes (Signed)
3 Days Post-Op   Subjective/Chief Complaint: Feeling Better. Pain moderately controlled with current regimen. HgB improved after 1 U pRBC   Objective: Vital signs in last 24 hours: Temp:  [97.7 F (36.5 C)-99.1 F (37.3 C)] 98.3 F (36.8 C) (07/20 2304) Pulse Rate:  [105-109] 105 (07/20 2304) Resp:  [16-18] 18 (07/20 2304) BP: (136-174)/(67-89) 136/67 (07/20 2304) SpO2:  [94 %-98 %] 98 % (07/20 2304) Last BM Date : 11/04/22  Intake/Output from previous day: 07/20 0701 - 07/21 0700 In: 318 [Blood:318] Out: 1050 [Urine:1050] Intake/Output this shift: No intake/output data recorded.  General appearance: alert and no distress Cardio: regular rate and rhythm Extremities: LEFT AKA dressing- C/D/I, thigh soft  Lab Results:  Recent Labs    11/06/22 0439 11/06/22 2202 11/07/22 0444  WBC 10.8*  --  10.5  HGB 6.6* 8.4* 8.3*  HCT 19.4* 24.7* 24.2*  PLT 276  --  288   BMET Recent Labs    11/06/22 0439 11/07/22 0444  NA 140 138  K 3.6 3.5  CL 112* 110  CO2 23 23  GLUCOSE 124* 137*  BUN 13 18  CREATININE 0.93 0.96  CALCIUM 8.7* 8.8*   PT/INR No results for input(s): "LABPROT", "INR" in the last 72 hours. ABG No results for input(s): "PHART", "HCO3" in the last 72 hours.  Invalid input(s): "PCO2", "PO2"  Studies/Results: No results found.  Anti-infectives: Anti-infectives (From admission, onward)    Start     Dose/Rate Route Frequency Ordered Stop   11/05/22 1000  hydroxychloroquine (PLAQUENIL) tablet 200 mg        200 mg Oral Daily 11/04/22 1040     11/04/22 0600  ciprofloxacin (CIPRO) IVPB 400 mg  Status:  Discontinued        400 mg 200 mL/hr over 60 Minutes Intravenous On call to O.R. 11/04/22 0014 11/04/22 1034       Assessment/Plan: s/p Procedure(s): AMPUTATION ABOVE KNEE (Left) POD #3 OOB with PT Pain control Monitor HgB Daily dressing changes Plan for dispo early next week  LOS: 3 days    Eli Hose A 11/07/2022

## 2022-11-08 MED ORDER — SENNA 8.6 MG PO TABS
1.0000 | ORAL_TABLET | Freq: Every day | ORAL | Status: DC
  Administered 2022-11-08 – 2022-11-09 (×2): 8.6 mg via ORAL
  Filled 2022-11-08 (×2): qty 1

## 2022-11-08 NOTE — Progress Notes (Signed)
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 Patient with reports of significant amount of pain in L residual limb. Required set up assist for lateral scoot transfer but no physical assist during transfer. Instructed patient on hip flexion and hip abduction exercises. Educated her on resting in flat position in bed intermittently during the day to stretch hip flexors. Discharge plan remains appropriate.       Assistance Recommended at Discharge PRN  If plan is discharge home, recommend the following:  Can travel by private vehicle    Assistance with cooking/housework;Help with stairs or ramp for entrance;Assist for transportation      Equipment Recommendations  None recommended by PT    Recommendations for Other Services       Precautions / Restrictions Precautions Precautions: Fall Restrictions Weight Bearing Restrictions: No     Mobility  Bed Mobility Overal bed mobility: Modified Independent                  Transfers Overall transfer level: Needs assistance Equipment used: None Transfers: Bed to chair/wheelchair/BSC            Lateral/Scoot Transfers: Supervision General transfer comment: set up assist for chair placement. Supervision for safety with patient demonstrating good hand placement during transfer. Complaining of increased pain to L residual limb    Ambulation/Gait                   Stairs             Wheelchair Mobility     Tilt Bed    Modified Rankin (Stroke Patients Only)       Balance                                            Cognition Arousal/Alertness: Awake/alert Behavior During Therapy: WFL for tasks  assessed/performed Overall Cognitive Status: Within Functional Limits for tasks assessed                                          Exercises Amputee Exercises Hip ABduction/ADduction: AROM, Left, 5 reps, Seated Hip Flexion/Marching: AROM, Left, 5 reps, Seated    General Comments        Pertinent Vitals/Pain Pain Assessment Pain Assessment: Faces Faces Pain Scale: Hurts whole lot Pain Location: L amputation site Pain Descriptors / Indicators: Grimacing, Guarding Pain Intervention(s): Monitored during session    Home Living                          Prior Function            PT Goals (current goals can now be found in the care plan section) Acute Rehab PT Goals Patient Stated Goal: pain control PT Goal Formulation: With patient Time For Goal Achievement: 11/19/22 Potential to Achieve Goals: Good Progress towards PT goals: Progressing toward goals    Frequency    Min 1X/week      PT Plan Current plan remains appropriate  Co-evaluation              AM-PAC PT "6 Clicks" Mobility   Outcome Measure  Help needed turning from your back to your side while in a flat bed without using bedrails?: None Help needed moving from lying on your back to sitting on the side of a flat bed without using bedrails?: None Help needed moving to and from a bed to a chair (including a wheelchair)?: A Little Help needed standing up from a chair using your arms (e.g., wheelchair or bedside chair)?: Total Help needed to walk in hospital room?: Total Help needed climbing 3-5 steps with a railing? : Total 6 Click Score: 14    End of Session   Activity Tolerance: Patient tolerated treatment well;Patient limited by pain Patient left: in chair;with call bell/phone within reach Nurse Communication: Mobility status PT Visit Diagnosis: Muscle weakness (generalized) (M62.81);Pain Pain - Right/Left: Left Pain - part of body: Leg     Time: 1610-9604 PT  Time Calculation (min) (ACUTE ONLY): 18 min  Charges:    $Therapeutic Activity: 8-22 mins PT General Charges $$ ACUTE PT VISIT: 1 Visit                     Maylon Peppers, PT, DPT Physical Therapist - [facility]  [facility]    Jourdin Connors A Abel Ra 11/08/2022, 1:09 PM

## 2022-11-08 NOTE — Progress Notes (Signed)
  Progress Note    11/08/2022 12:23 PM 4 Days Post-Op  Subjective: Sylvia [name] is a 67 y.o. female now POD #4 from left above the knee amputation. Patient rests comfortably in a bedside chair. She endorses dressing has been changed by nursing this morning because it fell off. She endorses her pain is much better    Vitals:   11/08/22 0033 11/08/22 0728  BP: (!) 147/71 118/71  Pulse: 94 89  Resp: 14 15  Temp: 98.6 F (37 C) 97.9 F (36.6 C)  SpO2: 91% 95%   Physical Exam: Cardiac:  RRR, Normal S1,S2 No Murmurs Lungs:  Normal respiratory effort. No rales, Rhonchi or wheezing.  Incisions:  Left AKA, incision clean dry and intact. Dressing in place.  Extremities:  Bilateral AKA's.  Abdomen:  Positive bowel sounds throughout, soft, non tender and non distended.  Neurologic: AAOX4 and follows commands.   CBC    Component Value Date/Time   WBC 10.5 11/07/2022 0444   RBC 2.93 (L) 11/07/2022 0444   HGB 8.3 (L) 11/07/2022 0444   HGB 8.1 (L) 05/09/2019 1539   HCT 24.2 (L) 11/07/2022 0444   HCT 24.6 (L) 05/09/2019 1539   PLT 288 11/07/2022 0444   PLT 470 (H) 05/09/2019 1539   MCV 82.6 11/07/2022 0444   MCV 88 05/09/2019 1539   MCH 28.3 11/07/2022 0444   MCHC 34.3 11/07/2022 0444   RDW 14.4 11/07/2022 0444   RDW 14.0 05/09/2019 1539   LYMPHSABS 2.0 11/01/2022 1050   MONOABS 0.8 11/01/2022 1050   EOSABS 0.2 11/01/2022 1050   BASOSABS 0.1 11/01/2022 1050    BMET    Component Value Date/Time   NA 138 11/07/2022 0444   K 3.5 11/07/2022 0444   CL 110 11/07/2022 0444   CO2 23 11/07/2022 0444   GLUCOSE 137 (H) 11/07/2022 0444   BUN 18 11/07/2022 0444   CREATININE 0.96 11/07/2022 0444   CALCIUM 8.8 (L) 11/07/2022 0444   GFRNONAA >60 11/07/2022 0444   GFRAA >60 11/23/2019 1505    INR    Component Value Date/Time   INR 1.1 02/23/2022 0345     Intake/Output Summary (Last 24 hours) at 11/08/2022 1223 Last data filed at 11/08/2022 0900 Gross per 24 hour  Intake  240 ml  Output 850 ml  Net -610 ml     Assessment/Plan:  67 y.o. female is s/p Left above the knee amputation. 4 Days Post-Op   PLAN: Dressing changes Daily Okay to discharge to home with [facility] RN for wound checks and PT for strengthening. Continue PT/OT Pain control as needed.   DVT prophylaxis:  Eliquis 5 mg twice daily.    Marcie Bal [facility] 11/08/2022 12:23 PM

## 2022-11-08 NOTE — Plan of Care (Signed)

## 2022-11-08 NOTE — Care Management Important Message (Signed)
Important Message  Patient Details  Name: Sylvia [name] MRN: [id number] Date of Birth: 08-Mar-1956   Medicare Important Message Given:  N/A - LOS <3 / Initial given by admissions     Johnell Comings 11/08/2022, 9:28 AM

## 2022-11-09 ENCOUNTER — Other Ambulatory Visit: Payer: Self-pay

## 2022-11-09 MED ORDER — POLYETHYLENE GLYCOL 3350 17 G PO PACK: 17.0000 g | PACK | Freq: Every day | ORAL | 0 refills | Status: DC | PRN

## 2022-11-09 NOTE — TOC Transition Note (Signed)
Transition of Care [facility]) - CM/SW Discharge Note   Patient Details  Name: Sylvia [name] MRN: [id number] Date of Birth: Jan 05, 1956  [facility]) CM/SW Contact:  Garret Reddish, RN Phone Number: 11/09/2022, 9:35 AM   Clinical Narrative:   Chart reviewed.  Noted that patient will be a discharge for today.    I have made Elnita Maxwell with [facility] that patient will be a discharge for today.  [facility] will provide Home Health RN for wound care checks and PT.    Sylvia [name] reports that her family will transport her home.    I have informed staff nurse of the above information.      Final next level of care: Home w Home Health Services Barriers to Discharge: No Barriers Identified   Patient Goals and CMS Choice   Choice offered to / list presented to : Patient  Discharge Placement                      Patient and family notified of of transfer: 11/09/22  Discharge Plan and Services Additional resources added to the After Visit Summary for                  DME Arranged:  (Patient has all needed DME)         HH Arranged: RN, PT [facility] Agency: [employer] [facility] Date [facility] Agency Contacted: 11/08/22   Representative spoke with at [facility] Agency: Elnita Maxwell  Social Determinants of Health (SDOH) Interventions SDOH Screenings   Food Insecurity: No Food Insecurity (11/04/2022)  Housing: Low Risk  (11/04/2022)  Transportation Needs: No Transportation Needs (11/04/2022)  Utilities: Not At Risk (11/04/2022)  Financial Resource Strain: Low Risk  (06/14/2018)  Physical Activity: Insufficiently Active (06/14/2018)  Social Connections: Unknown (06/14/2018)  Stress: No Stress Concern Present (06/14/2018)  Tobacco Use: Medium Risk (11/04/2022)     Readmission Risk Interventions    02/23/2022    4:04 PM  Readmission Risk Prevention Plan  Transportation Screening Complete  PCP or Specialist Appt within 3-5 Days Complete  HRI or Home Care Consult Complete  Social  Work Consult for Recovery Care Planning/Counseling Complete  Palliative Care Screening Not Applicable  Medication Review Oceanographer) Complete

## 2022-11-09 NOTE — Care Management Important Message (Signed)
Important Message  Patient Details  Name: Sylvia [name] MRN: [id number] Date of Birth: 18-Sep-1955   Medicare Important Message Given:  N/A - LOS <3 / Initial given by admissions     Olegario Messier A Nichalos Brenton 11/09/2022, 8:39 AM

## 2022-11-09 NOTE — Progress Notes (Addendum)
D/C AVS completed and reviewed with pt. All opportunities for questions answered and clarified. IV removed. Pt will be wheeled down to car at medical mall entrance via wheelchair.

## 2022-11-09 NOTE — TOC Progression Note (Signed)
Transition of Care [facility]) - Progression Note    Patient Details  Name: Sylvia [name] MRN: [id number] Date of Birth: 08/27/1955  Transition of Care [facility]) CM/SW Contact  Garret Reddish, RN Phone Number: 11/09/2022, 9:25 AM  Clinical Narrative:   Chart reviewed.  I have meet with Mrs. Lyssy at bedside on yesterday.  She informs me that she lives with her significant other, and granddaughter.    Mrs. Roediger reports that she has a Personnel officer, Advertising account executive chair at home.   I have spoken with Mrs. Turnage about home health services on discharge.  Mrs. Loser informs me that she has used [facility] in the past and would like to use their services on discharge.  I have asked Elnita Maxwell with [facility] to accept home health referral.  Home health services will include Home health nursing for RN to check wound on the Left AKA and PT.  I have informed Elnita Maxwell that patient will be a discharge for today.    Mrs. Wike reports that patient will her family will transport her home today.              Expected Discharge Plan and Services                                               Social Determinants of Health (SDOH) Interventions SDOH Screenings   Food Insecurity: No Food Insecurity (11/04/2022)  Housing: Low Risk  (11/04/2022)  Transportation Needs: No Transportation Needs (11/04/2022)  Utilities: Not At Risk (11/04/2022)  Financial Resource Strain: Low Risk  (06/14/2018)  Physical Activity: Insufficiently Active (06/14/2018)  Social Connections: Unknown (06/14/2018)  Stress: No Stress Concern Present (06/14/2018)  Tobacco Use: Medium Risk (11/04/2022)    Readmission Risk Interventions    02/23/2022    4:04 PM  Readmission Risk Prevention Plan  Transportation Screening Complete  PCP or Specialist Appt within 3-5 Days Complete  HRI or Home Care Consult Complete  Social Work Consult for Recovery Care Planning/Counseling Complete  Palliative  Care Screening Not Applicable  Medication Review Oceanographer) Complete

## 2022-11-09 NOTE — Discharge Instructions (Signed)
Daily dressing change to left Above the knee amputation stump. Keep staples covered with dry guaze and wrap stump snugly with ace bandage at all times to assist in reducing swelling.

## 2022-11-09 NOTE — Discharge Summary (Signed)
[facility]    Discharge Summary    Patient ID:  Sylvia [name] MRN: [id number] DOB/AGE: [birth year] 68 y.o.  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) [T87.89] Amputation above knee (HCC) [S78.119A]  Secondary Diagnoses: Past Medical History:  Diagnosis Date   Anemia of chronic disease 05/16/2019   Anxiety    h/o   Arthritis    Chronic kidney disease    Cocaine abuse (HCC)    COPD (chronic obstructive pulmonary disease) (HCC)    Coronary artery disease    Depression    Diabetes mellitus without complication (HCC)    DKA (diabetic ketoacidosis) (HCC)    Elevated troponin    GERD (gastroesophageal reflux disease)    GI bleed    Hypertension    bp under control-off meds since 2019   Ischemic leg     Procedure(s): AMPUTATION ABOVE KNEE  Discharged Condition: good  HPI:  Sylvia [name] is a 67 y.o. now S/P left aka from prior BKA contraction. Patient recovering as expected. Staples remain in tact on discharge. Patient scheduled to return in 2 weeks for removal. Patient to be discharged to home with home health services. Patient upon discharge has minimal pain and denies any chest pain, Shortness of breath. Patient was not ordered any pain medication on discharge due to last refill being 10/15/22. No other complaints. Vital all remain stable.   Hospital Course:  Sylvia [name] is a 67 y.o. 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  Extubated: POD # 0 Physical Exam:  Alert notes x3, no acute distress Face: Symmetrical.  Tongue is midline. Neck: Trachea is midline.  No swelling or bruising. Cardiovascular:  Regular rate and rhythm Pulmonary: Clear to auscultation bilaterally Abdomen: Soft, nontender, nondistended Left lower extremity: Thigh soft.  Left AKA Right lower extremity: Thigh soft.  Right BKA Neurological: No deficits noted   Post-op wounds:  clean, dry, intact or healing well  Pt. Ambulating, voiding and taking PO diet without difficulty. Pt pain controlled with PO pain meds.  Labs:  As below  Complications: none  Consults:    Significant Diagnostic Studies: CBC Lab Results  Component Value Date   WBC 10.5 11/07/2022   HGB 8.3 (L) 11/07/2022   HCT 24.2 (L) 11/07/2022   MCV 82.6 11/07/2022   PLT 288 11/07/2022    BMET    Component Value Date/Time   NA 138 11/07/2022 0444   K 3.5 11/07/2022 0444   CL 110 11/07/2022 0444   CO2 23 11/07/2022 0444   GLUCOSE 137 (H) 11/07/2022 0444   BUN 18 11/07/2022 0444   CREATININE 0.96 11/07/2022 0444   CALCIUM 8.8 (L) 11/07/2022 0444   GFRNONAA >60 11/07/2022 0444   GFRAA >60 11/23/2019 1505   COAG Lab Results  Component Value Date   INR 1.1 02/23/2022   INR 1.3 (H) 09/21/2021   INR 1.1 12/25/2020     Disposition:  Discharge to :Home  Allergies as of 11/09/2022       Reactions   Vancomycin Rash   Patient developed a rash to injection site and arm a few mintes after starting ABX.    Hydrocodone Rash   Patient tolerates hydromorphone   Metformin And Related Diarrhea  Penicillins Hives   Tramadol Itching        Medication List     TAKE these medications    acetaminophen 500 MG tablet Commonly known as: TYLENOL Take 500 mg by mouth every 6 (six) hours as needed.   albuterol 108 (90 Base) MCG/ACT inhaler Commonly known as: VENTOLIN HFA Inhale 2 puffs into the lungs every 6 (six) hours as needed for wheezing or shortness of breath.   apixaban 5 MG Tabs tablet Commonly known as: ELIQUIS Take 1 tablet (5 mg total) by mouth 2 (two) times daily.   atorvastatin 10 MG tablet Commonly known as:  LIPITOR Take 10 mg by mouth at bedtime.   atorvastatin 80 MG tablet Commonly known as: LIPITOR Take 80 mg by mouth daily.   cetirizine 10 MG tablet Commonly known as: ZYRTEC Take 10 mg by mouth daily as needed for allergies.   cyanocobalamin 500 MCG tablet Commonly known as: VITAMIN B12 Take 1 tablet (500 mcg total) by mouth daily.   cyclobenzaprine 10 MG tablet Commonly known as: FLEXERIL Take 10 mg by mouth 3 (three) times daily as needed for muscle spasms. Take 1/2 a tablet as needed for pain   DULoxetine 60 MG capsule Commonly known as: CYMBALTA Take 1 capsule (60 mg total) by mouth daily.   ferrous sulfate 325 (65 FE) MG tablet Take 325 mg by mouth daily.   gabapentin 300 MG capsule Commonly known as: NEURONTIN TAKE 3 CAPSULES BY MOUTH IN THE  MORNING AND 3 CAPSULES BY MOUTH  IN THE EVENING   hydroxychloroquine 200 MG tablet Commonly known as: PLAQUENIL Take 200 mg by mouth daily.   lisinopril 10 MG tablet Commonly known as: ZESTRIL Take 1 tablet (10 mg total) by mouth daily.   lisinopril 20 MG tablet Commonly known as: ZESTRIL Take 20 mg by mouth daily.   oxyCODONE-acetaminophen 5-325 MG tablet Commonly known as: PERCOCET/ROXICET Take 1 tablet by mouth every 6 (six) hours as needed for severe pain.   pantoprazole 40 MG tablet Commonly known as: PROTONIX Take 1 tablet (40 mg total) by mouth daily.   polyethylene glycol 17 g packet Commonly known as: MIRALAX / GLYCOLAX Take 17 g by mouth daily as needed for mild constipation.   sitaGLIPtin 100 MG tablet Commonly known as: JANUVIA Take 100 mg by mouth every morning.   Victoza 18 MG/3ML Sopn Generic drug: liraglutide Inject 0.6 mg into the skin daily.   Voltaren Arthritis Pain 1 % Gel Generic drug: diclofenac Sodium Apply 2 g topically 4 (four) times daily as needed.   WOMENS MULTIVITAMIN PO Take 1 tablet by mouth daily.       Verbal and written Discharge instructions given to the patient.  Wound care per Discharge AVS  Follow-up Information     Georgiana Spinner, NP Follow up in 2 week(s).   Specialty: Vascular Surgery Why: Post Operative wound check with staple removal. Contact information: [address] [place] [address] [phone]                 Signed: Marcie Bal, NP  11/09/2022, 9:59 AM

## 2022-11-11 ENCOUNTER — Encounter: Payer: Self-pay | Admitting: Oncology

## 2022-11-11 ENCOUNTER — Other Ambulatory Visit (INDEPENDENT_AMBULATORY_CARE_PROVIDER_SITE_OTHER): Payer: Self-pay

## 2022-11-11 ENCOUNTER — Other Ambulatory Visit: Payer: Self-pay

## 2022-11-11 ENCOUNTER — Telehealth (INDEPENDENT_AMBULATORY_CARE_PROVIDER_SITE_OTHER): Payer: Self-pay

## 2022-11-11 MED ORDER — OXYCODONE-ACETAMINOPHEN 5-325 MG PO TABS
1.0000 | ORAL_TABLET | Freq: Four times a day (QID) | ORAL | 0 refills | Status: DC | PRN
  Filled 2022-11-11: qty 30, 8d supply, fill #0

## 2022-11-11 NOTE — Telephone Encounter (Addendum)
Sylvia [name] called for a refill on her pain medication  oxyCODONE-acetaminophen (PERCOCET/ROXICET) 5-325 MG tablet To [facility] on [address]. If its Okay just sign off on it.   She stated Vicodin doesn't work for her.  Please advise

## 2022-11-12 ENCOUNTER — Telehealth (INDEPENDENT_AMBULATORY_CARE_PROVIDER_SITE_OTHER): Payer: Self-pay

## 2022-11-12 ENCOUNTER — Other Ambulatory Visit: Payer: Self-pay

## 2022-11-12 NOTE — Telephone Encounter (Signed)
Pt called needing pain meds refilled.  I got in touch with Dr Wyn Quaker in Secure chat and he sent in a refill.  I called and made pt aware.  She was very thankful

## 2022-11-18 ENCOUNTER — Other Ambulatory Visit (INDEPENDENT_AMBULATORY_CARE_PROVIDER_SITE_OTHER): Payer: Self-pay | Admitting: Nurse Practitioner

## 2022-11-18 ENCOUNTER — Other Ambulatory Visit: Payer: Self-pay

## 2022-11-18 ENCOUNTER — Telehealth (INDEPENDENT_AMBULATORY_CARE_PROVIDER_SITE_OTHER): Payer: Self-pay

## 2022-11-18 MED ORDER — OXYCODONE HCL 10 MG PO TABS
10.0000 mg | ORAL_TABLET | Freq: Four times a day (QID) | ORAL | 0 refills | Status: DC | PRN
  Filled 2022-11-19: qty 30, 8d supply, fill #0

## 2022-11-18 NOTE — Telephone Encounter (Signed)
Yes in response to the patient's phone call earlier I did send medication that can be filled tomorrow.  This is because the patient was sent in 30 tablets which were filled on 11/12/2022.  She was sent Percocet 5-325.  These should have lasted her for a little over a week, however given the patient's significant pain I am making an exception of filling as well as increasing the dosage.  As far as her heart rate I suspect this may be related to the pain in addition the patient does have some anxiety issues that happened when she is in pain which is likely exacerbating her heart rate.  It is important for the patient to take her medication as prescribed especially in the case of narcotic medications.  If it is felt that these are narcotic medications are being abused, but not taking as prescribed, we will not be able to send in further refills.

## 2022-11-18 NOTE — Telephone Encounter (Signed)
She got percocet sent in on 7/25 and picked up on 7/26. Based on that we can't send anything in until tomorrow

## 2022-11-18 NOTE — Telephone Encounter (Signed)
Patient called request for a medication stronger than the " aspirin or tylenol" that she has been getting prescribed. She states that" she can no longer take this pain".   Please advise

## 2022-11-18 NOTE — Telephone Encounter (Signed)
Sylvia [name]'s physical therapist called to report her pain level is a 8/10 on her left above knee amputation. He would like for the necessary things to be done.

## 2022-11-18 NOTE — Telephone Encounter (Signed)
Darl Pikes from [place] home health reach out to informed that the patient is having severe pain with left above knee amputation and that her heart rate was at 104. Patient was informed that prescription for pain refill was sent to pharmacy and she can pick up tomorrow. Also Vivia Birmingham NP was informed with report from home health nurse.

## 2022-11-19 ENCOUNTER — Other Ambulatory Visit: Payer: Self-pay

## 2022-11-19 ENCOUNTER — Telehealth (INDEPENDENT_AMBULATORY_CARE_PROVIDER_SITE_OTHER): Payer: Self-pay

## 2022-11-19 NOTE — Telephone Encounter (Signed)
They will not accept her because she still in her post op period but if she continues to have issues after that period it can be considered

## 2022-11-19 NOTE — Telephone Encounter (Signed)
Darl Pikes from [place] home health reach suggesting if the patient should be referred to pain management since she was requesting for pain medication refill so soon. Please Advise

## 2022-11-19 NOTE — Telephone Encounter (Signed)
Home health nurse was notified with medical recommendations and verbalized understanding

## 2022-11-25 ENCOUNTER — Telehealth (INDEPENDENT_AMBULATORY_CARE_PROVIDER_SITE_OTHER): Payer: Self-pay

## 2022-11-25 ENCOUNTER — Other Ambulatory Visit (INDEPENDENT_AMBULATORY_CARE_PROVIDER_SITE_OTHER): Payer: Self-pay | Admitting: Nurse Practitioner

## 2022-11-26 ENCOUNTER — Other Ambulatory Visit (INDEPENDENT_AMBULATORY_CARE_PROVIDER_SITE_OTHER): Payer: Self-pay | Admitting: Nurse Practitioner

## 2022-11-26 MED ORDER — OXYCODONE HCL 15 MG PO TABS: 15.0000 mg | ORAL_TABLET | Freq: Four times a day (QID) | ORAL | 0 refills | Status: DC | PRN

## 2022-11-26 NOTE — Telephone Encounter (Signed)
I have sent her in 15 mg tabs to the [facility] on [address].  This is typically as high as we will go.  She can also alternate with 1000 mg of tylenol to help with pain as well every 6-8 hours

## 2022-11-26 NOTE — Telephone Encounter (Signed)
I spoke with pt to let her know the meds were sent and she states understanding

## 2022-11-27 NOTE — Progress Notes (Signed)
Patient ID: Sylvia [name], female   DOB: 05/04/1955, 67 y.o.   MRN: [id number]  No chief complaint on file.   HPI Sylvia [name] is a 67 y.o. female.    She is describing phantom pains but otherwise no significant issues   Past Medical History:  Diagnosis Date   Anemia of chronic disease 05/16/2019   Anxiety    h/o   Arthritis    Chronic kidney disease    Cocaine abuse (HCC)    COPD (chronic obstructive pulmonary disease) (HCC)    Coronary artery disease    Depression    Diabetes mellitus without complication (HCC)    DKA (diabetic ketoacidosis) (HCC)    Elevated troponin    GERD (gastroesophageal reflux disease)    GI bleed    Hypertension    bp under control-off meds since 2019   Ischemic leg     Past Surgical History:  Procedure Laterality Date   ABDOMINAL HYSTERECTOMY     AMPUTATION Right 12/27/2018   Procedure: AMPUTATION BELOW KNEE;  Surgeon: Annice Needy, MD;  Location: [facility];  Service: General;  Laterality: Right;   AMPUTATION Left 11/05/2021   Procedure: AMPUTATION BELOW KNEE;  Surgeon: Annice Needy, MD;  Location: [facility];  Service: Vascular;  Laterality: Left;   AMPUTATION Left 11/04/2022   Procedure: AMPUTATION ABOVE KNEE;  Surgeon: Annice Needy, MD;  Location: [facility];  Service: Vascular;  Laterality: Left;   APPLICATION OF WOUND VAC  10/16/2021   Procedure: APPLICATION OF WOUND VAC;  Surgeon: Annice Needy, MD;  Location: [facility];  Service: Vascular;;   CATARACT EXTRACTION W/PHACO Right 03/27/2020   Procedure: CATARACT EXTRACTION PHACO AND INTRAOCULAR LENS PLACEMENT (IOC) RIGHT DIABETIC 7.54 00:52.5;  Surgeon: Galen Manila, MD;  Location: [facility];  Service: Ophthalmology;  Laterality: Right;   CATARACT EXTRACTION W/PHACO Left 05/20/2020   Procedure: CATARACT EXTRACTION PHACO AND INTRAOCULAR LENS PLACEMENT (IOC) LEFT DIABETIC 4.95 00:37.6;  Surgeon: Galen Manila, MD;  Location: [facility];  Service:  Ophthalmology;  Laterality: Left;  Diabetic - oral meds COVID + 04-24-20   CHOLECYSTECTOMY     COLONOSCOPY WITH PROPOFOL N/A 04/16/2019   Procedure: COLONOSCOPY WITH PROPOFOL;  Surgeon: Pasty Spillers, MD;  Location: [facility];  Service: Endoscopy;  Laterality: N/A;   COLONOSCOPY WITH PROPOFOL N/A 01/16/2020   Procedure: COLONOSCOPY WITH PROPOFOL;  Surgeon: Pasty Spillers, MD;  Location: [facility];  Service: Endoscopy;  Laterality: N/A;   ENDARTERECTOMY FEMORAL Left 10/16/2021   Procedure: ENDARTERECTOMY FEMORAL;  Surgeon: Annice Needy, MD;  Location: [facility];  Service: Vascular;  Laterality: Left;   ESOPHAGOGASTRODUODENOSCOPY (EGD) WITH PROPOFOL N/A 06/15/2018   Procedure: ESOPHAGOGASTRODUODENOSCOPY (EGD) WITH PROPOFOL;  Surgeon: Toledo, Boykin Nearing, MD;  Location: [facility];  Service: Gastroenterology;  Laterality: N/A;   ESOPHAGOGASTRODUODENOSCOPY (EGD) WITH PROPOFOL N/A 01/03/2019   Procedure: ESOPHAGOGASTRODUODENOSCOPY (EGD) WITH PROPOFOL;  Surgeon: Midge Minium, MD;  Location: [facility];  Service: Endoscopy;  Laterality: N/A;   ESOPHAGOGASTRODUODENOSCOPY (EGD) WITH PROPOFOL N/A 09/25/2021   Procedure: ESOPHAGOGASTRODUODENOSCOPY (EGD) WITH PROPOFOL;  Surgeon: Midge Minium, MD;  Location: [facility];  Service: Endoscopy;  Laterality: N/A;   EYE SURGERY Bilateral    GIVENS CAPSULE STUDY  04/16/2019   Procedure: GIVENS CAPSULE STUDY;  Surgeon: Pasty Spillers, MD;  Location: [facility];  Service: Endoscopy;;   GIVENS CAPSULE STUDY N/A 06/25/2019   Procedure: GIVENS CAPSULE STUDY;  Surgeon: Wyline Mood, MD;  Location: [facility];  Service: Gastroenterology;  Laterality: N/A;   INCISION AND DRAINAGE OF WOUND Left 02/24/2022   Procedure: LEFT BKA IRRIGATION AND DEBRIDEMENT WOUND;  Surgeon: Annice Needy, MD;  Location: [facility];  Service: Vascular;  Laterality: Left;   LOWER EXTREMITY ANGIOGRAPHY Right 08/21/2018   Procedure: LOWER EXTREMITY  ANGIOGRAPHY;  Surgeon: Annice Needy, MD;  Location: [facility];  Service: Cardiovascular;  Laterality: Right;   LOWER EXTREMITY ANGIOGRAPHY Left 08/28/2018   Procedure: LOWER EXTREMITY ANGIOGRAPHY;  Surgeon: Annice Needy, MD;  Location: [facility];  Service: Cardiovascular;  Laterality: Left;   LOWER EXTREMITY ANGIOGRAPHY Right 08/28/2018   Procedure: Lower Extremity Angiography;  Surgeon: Annice Needy, MD;  Location: [facility];  Service: Cardiovascular;  Laterality: Right;   LOWER EXTREMITY ANGIOGRAPHY Right 12/18/2018   Procedure: Lower Extremity Angiography;  Surgeon: Annice Needy, MD;  Location: [facility];  Service: Cardiovascular;  Laterality: Right;   LOWER EXTREMITY ANGIOGRAPHY Right 12/18/2018   Procedure: Lower Extremity Angiography;  Surgeon: Annice Needy, MD;  Location: [facility];  Service: Cardiovascular;  Laterality: Right;   LOWER EXTREMITY ANGIOGRAPHY Left 12/21/2018   Procedure: Lower Extremity Angiography;  Surgeon: Annice Needy, MD;  Location: [facility];  Service: Cardiovascular;  Laterality: Left;   LOWER EXTREMITY ANGIOGRAPHY Right 12/21/2018   Procedure: Lower Extremity Angiography;  Surgeon: Annice Needy, MD;  Location: [facility];  Service: Cardiovascular;  Laterality: Right;   LOWER EXTREMITY ANGIOGRAPHY Left 12/25/2020   Procedure: LOWER EXTREMITY ANGIOGRAPHY;  Surgeon: Annice Needy, MD;  Location: [facility];  Service: Cardiovascular;  Laterality: Left;   LOWER EXTREMITY ANGIOGRAPHY Left 09/21/2021   Procedure: Lower Extremity Angiography;  Surgeon: Annice Needy, MD;  Location: [facility];  Service: Cardiovascular;  Laterality: Left;   LOWER EXTREMITY ANGIOGRAPHY Left 10/13/2021   Procedure: Lower Extremity Angiography;  Surgeon: Renford Dills, MD;  Location: [facility];  Service: Cardiovascular;  Laterality: Left;   LOWER EXTREMITY ANGIOGRAPHY Left 10/14/2021    Procedure: Lower Extremity Angiography;  Surgeon: Annice Needy, MD;  Location: [facility];  Service: Cardiovascular;  Laterality: Left;   LOWER EXTREMITY ANGIOGRAPHY Left 11/02/2021   Procedure: Lower Extremity Angiography;  Surgeon: Annice Needy, MD;  Location: [facility];  Service: Cardiovascular;  Laterality: Left;   LOWER EXTREMITY INTERVENTION N/A 12/22/2018   Procedure: LOWER EXTREMITY INTERVENTION;  Surgeon: Annice Needy, MD;  Location: [facility];  Service: Cardiovascular;  Laterality: N/A;   LOWER EXTREMITY INTERVENTION Left 12/26/2020   Procedure: LOWER EXTREMITY INTERVENTION;  Surgeon: Annice Needy, MD;  Location: [facility];  Service: Cardiovascular;  Laterality: Left;   LOWER EXTREMITY INTERVENTION N/A 09/22/2021   Procedure: LOWER EXTREMITY INTERVENTION;  Surgeon: Annice Needy, MD;  Location: [facility];  Service: Cardiovascular;  Laterality: N/A;      Allergies  Allergen Reactions   Vancomycin Rash    Patient developed a rash to injection site and arm a few mintes after starting ABX.    Hydrocodone Rash    Patient tolerates hydromorphone   Metformin And Related Diarrhea   Penicillins Hives   Tramadol Itching    Current Outpatient Medications  Medication Sig Dispense Refill   acetaminophen (TYLENOL) 500 MG tablet Take 500 mg by mouth every 6 (six) hours as needed.     albuterol (PROVENTIL HFA;VENTOLIN HFA) 108 (90 Base) MCG/ACT  inhaler Inhale 2 puffs into the lungs every 6 (six) hours as needed for wheezing or shortness of breath. 1 Inhaler 2   apixaban (ELIQUIS) 5 MG TABS tablet Take 1 tablet (5 mg total) by mouth 2 (two) times daily. 60 tablet 12   atorvastatin (LIPITOR) 10 MG tablet Take 10 mg by mouth at bedtime.     atorvastatin (LIPITOR) 80 MG tablet Take 80 mg by mouth daily.     cetirizine (ZYRTEC) 10 MG tablet Take 10 mg by mouth daily as needed for allergies.     cyclobenzaprine (FLEXERIL) 10 MG tablet Take 10  mg by mouth 3 (three) times daily as needed for muscle spasms. Take 1/2 a tablet as needed for pain     DULoxetine (CYMBALTA) 60 MG capsule Take 1 capsule (60 mg total) by mouth daily. 30 capsule 3   ferrous sulfate 325 (65 FE) MG tablet Take 325 mg by mouth daily.      gabapentin (NEURONTIN) 300 MG capsule TAKE 3 CAPSULES BY MOUTH IN THE  MORNING AND 3 CAPSULES BY MOUTH  IN THE EVENING 450 capsule 3   hydroxychloroquine (PLAQUENIL) 200 MG tablet Take 200 mg by mouth daily.     lisinopril (ZESTRIL) 10 MG tablet Take 1 tablet (10 mg total) by mouth daily. (Patient not taking: Reported on 11/04/2022) 30 tablet 1   lisinopril (ZESTRIL) 20 MG tablet Take 20 mg by mouth daily.     Multiple Vitamins-Minerals (WOMENS MULTIVITAMIN PO) Take 1 tablet by mouth daily.     oxyCODONE (ROXICODONE) 15 MG immediate release tablet Take 1 tablet (15 mg total) by mouth every 6 (six) hours as needed for pain. 30 tablet 0   pantoprazole (PROTONIX) 40 MG tablet Take 1 tablet (40 mg total) by mouth daily. 30 tablet 11   polyethylene glycol (MIRALAX / GLYCOLAX) 17 g packet Take 17 g by mouth daily as needed for mild constipation. 14 each 0   sitaGLIPtin (JANUVIA) 100 MG tablet Take 100 mg by mouth every morning.     VICTOZA 18 MG/3ML SOPN Inject 0.6 mg into the skin daily.     vitamin B-12 (CYANOCOBALAMIN) 500 MCG tablet Take 1 tablet (500 mcg total) by mouth daily. 90 tablet 1   VOLTAREN ARTHRITIS PAIN 1 % GEL Apply 2 g topically 4 (four) times daily as needed.     No current facility-administered medications for this visit.        Physical Exam There were no vitals taken for this visit. Gen:  WD/WN, NAD Skin: incision C/D/I Staples are removed     Assessment/Plan:  1. Ischemic leg Patient is status post amputation.  She will follow-up in 3 months.  She already has an appointment to follow-up with [facility] for evaluation for a prosthesis      Levora Dredge 11/27/2022, 2:08 PM   This note was  created with Dragon medical transcription system.  Any errors from dictation are unintentional.

## 2022-11-29 ENCOUNTER — Encounter (INDEPENDENT_AMBULATORY_CARE_PROVIDER_SITE_OTHER): Payer: Self-pay | Admitting: Vascular Surgery

## 2022-11-29 ENCOUNTER — Ambulatory Visit (INDEPENDENT_AMBULATORY_CARE_PROVIDER_SITE_OTHER): Payer: 59 | Admitting: Vascular Surgery

## 2022-11-29 VITALS — BP 171/76 | HR 114 | Resp 18 | Ht 67.0 in | Wt 130.0 lb

## 2022-11-29 DIAGNOSIS — I998 Other disorder of circulatory system: Secondary | ICD-10-CM

## 2022-11-30 ENCOUNTER — Telehealth (INDEPENDENT_AMBULATORY_CARE_PROVIDER_SITE_OTHER): Payer: Self-pay

## 2022-11-30 NOTE — Telephone Encounter (Signed)
Victorino Dike from [facility] left a message informing that the patient heart rate was 105 and pain level was 8 out 10. The patient states that the pain medication is helping significantly. Home health nurse wanted to report for documentation.

## 2022-12-02 ENCOUNTER — Telehealth (INDEPENDENT_AMBULATORY_CARE_PROVIDER_SITE_OTHER): Payer: Self-pay

## 2022-12-02 NOTE — Telephone Encounter (Signed)
Noreene Larsson from [facility] left a message informing that the patient heart rate was 109. Noreene Larsson wanted to make provider aware for documentation. I left a detail message that we recommended that she should report heart rate to patient PCP also.

## 2022-12-02 NOTE — Telephone Encounter (Signed)
Patient left a voicemail stating a stable is embedded down her stump from surgery. She seen it after her removal last week.   9:26 am: Per Vivia Birmingham- Inform her home health nurse during her next visit and allow them to remove it for her.   9:40 am- Patient notified

## 2022-12-10 ENCOUNTER — Inpatient Hospital Stay: Payer: 59 | Attending: Oncology

## 2022-12-10 DIAGNOSIS — D638 Anemia in other chronic diseases classified elsewhere: Secondary | ICD-10-CM

## 2022-12-10 DIAGNOSIS — M069 Rheumatoid arthritis, unspecified: Secondary | ICD-10-CM | POA: Insufficient documentation

## 2022-12-10 DIAGNOSIS — N189 Chronic kidney disease, unspecified: Secondary | ICD-10-CM | POA: Insufficient documentation

## 2022-12-10 DIAGNOSIS — D631 Anemia in chronic kidney disease: Secondary | ICD-10-CM | POA: Insufficient documentation

## 2022-12-10 DIAGNOSIS — Z87891 Personal history of nicotine dependence: Secondary | ICD-10-CM | POA: Insufficient documentation

## 2022-12-10 LAB — CBC WITH DIFFERENTIAL (CANCER CENTER ONLY)
Abs Immature Granulocytes: 0.1 10*3/uL — ABNORMAL HIGH (ref 0.00–0.07)
Basophils Absolute: 0.1 10*3/uL (ref 0.0–0.1)
Basophils Relative: 1 %
Eosinophils Absolute: 0.3 10*3/uL (ref 0.0–0.5)
Eosinophils Relative: 4 %
HCT: 29.8 % — ABNORMAL LOW (ref 36.0–46.0)
Hemoglobin: 10.1 g/dL — ABNORMAL LOW (ref 12.0–15.0)
Immature Granulocytes: 1 %
Lymphocytes Relative: 24 %
Lymphs Abs: 2 10*3/uL (ref 0.7–4.0)
MCH: 28.2 pg (ref 26.0–34.0)
MCHC: 33.9 g/dL (ref 30.0–36.0)
MCV: 83.2 fL (ref 80.0–100.0)
Monocytes Absolute: 0.5 10*3/uL (ref 0.1–1.0)
Monocytes Relative: 6 %
Neutro Abs: 5.2 10*3/uL (ref 1.7–7.7)
Neutrophils Relative %: 64 %
Platelet Count: 303 10*3/uL (ref 150–400)
RBC: 3.58 MIL/uL — ABNORMAL LOW (ref 3.87–5.11)
RDW: 13.9 % (ref 11.5–15.5)
WBC Count: 8.2 10*3/uL (ref 4.0–10.5)
nRBC: 0 % (ref 0.0–0.2)

## 2022-12-10 LAB — RETIC PANEL
Immature Retic Fract: 9 % (ref 2.3–15.9)
RBC.: 3.61 MIL/uL — ABNORMAL LOW (ref 3.87–5.11)
Retic Count, Absolute: 51.6 10*3/uL (ref 19.0–186.0)
Retic Ct Pct: 1.4 % (ref 0.4–3.1)
Reticulocyte Hemoglobin: 32.6 pg (ref >27.9)

## 2022-12-10 LAB — IRON AND TIBC
Iron: 91 ug/dL (ref 28–170)
Saturation Ratios: 29 % (ref 10.4–31.8)
TIBC: 318 ug/dL (ref 250–450)
UIBC: 227 ug/dL

## 2022-12-10 LAB — FERRITIN: Ferritin: 95 ng/mL (ref 11–307)

## 2022-12-14 ENCOUNTER — Other Ambulatory Visit (INDEPENDENT_AMBULATORY_CARE_PROVIDER_SITE_OTHER): Payer: Self-pay | Admitting: Nurse Practitioner

## 2022-12-14 MED ORDER — OXYCODONE HCL 15 MG PO TABS: 15.0000 mg | ORAL_TABLET | Freq: Four times a day (QID) | ORAL | 0 refills | Status: DC | PRN

## 2022-12-15 ENCOUNTER — Inpatient Hospital Stay (HOSPITAL_BASED_OUTPATIENT_CLINIC_OR_DEPARTMENT_OTHER): Payer: 59 | Admitting: Oncology

## 2022-12-15 ENCOUNTER — Encounter: Payer: Self-pay | Admitting: Oncology

## 2022-12-15 ENCOUNTER — Inpatient Hospital Stay: Payer: 59

## 2022-12-15 VITALS — BP 170/74 | HR 98

## 2022-12-15 VITALS — BP 155/78 | HR 106 | Temp 97.2°F | Resp 18 | Wt 124.8 lb

## 2022-12-15 DIAGNOSIS — D638 Anemia in other chronic diseases classified elsewhere: Secondary | ICD-10-CM

## 2022-12-15 DIAGNOSIS — N189 Chronic kidney disease, unspecified: Secondary | ICD-10-CM | POA: Diagnosis not present

## 2022-12-15 DIAGNOSIS — M069 Rheumatoid arthritis, unspecified: Secondary | ICD-10-CM

## 2022-12-15 MED ORDER — SODIUM CHLORIDE 0.9 % IV SOLN
Freq: Once | INTRAVENOUS | Status: AC
  Filled 2022-12-15: qty 250

## 2022-12-15 MED ORDER — SODIUM CHLORIDE 0.9 % IV SOLN
200.0000 mg | Freq: Once | INTRAVENOUS | Status: AC
  Administered 2022-12-15: 200 mg via INTRAVENOUS
  Filled 2022-12-15: qty 200

## 2022-12-15 NOTE — Progress Notes (Signed)
Hematology/Oncology Progress note [phone] [phone]     REFERRING PROVIDER: [facility] CHIEF COMPLAINTS/REASON FOR VISIT:  Follow up of anemia  ASSESSMENT & PLAN:   Anemia of chronic disease Anemia of chronic disease - RA and CKD. Labs are reviewed and discussed with patient.   Lab Results  Component Value Date   HGB 10.1 (L) 12/10/2022   TIBC 318 12/10/2022   IRONPCTSAT 29 12/10/2022   FERRITIN 95 12/10/2022    Hemoglobin >10. Recommend patient to proceed with Venofer 200 mg x 3 to further increase iron stores. No need for erythropoietin replacement therapy currently.   Orders Placed This Encounter  Procedures   CBC with Differential (Cancer Center Only)    Standing Status:   Future    Standing Expiration Date:   12/15/2023   Iron and TIBC    Standing Status:   Future    Standing Expiration Date:   12/15/2023   Ferritin    Standing Status:   Future    Standing Expiration Date:   12/15/2023   Follow-up in 4 months All questions were answered. The patient knows to call the clinic with any problems, questions or concerns.  Rickard Patience, MD, PhD [facility] Hematology Oncology 12/15/2022    HISTORY OF PRESENTING ILLNESS:   Reviewed patient's recent labs  05/09/2019, hemoglobin 8.1, MCV 88, platelet 470. 04/16/2019, iron panel showed saturation 18, ferritin 28, TIBC 308 Patient has been started on oral iron supplementation NiFerrex 150 mg daily.  She reports tolerating well.  Reviewed patient's previous labs ordered by primary care physician's office, anemia is chronic onset , duration is since at least July 2017.  Patient's hemoglobin was about 9 back in September 2020.  She was admitted at that time due to severe PAD and right foot gangrene.  Status post right below-knee amputation.   She was admitted in December due to acute drop of hemoglobin to 6.  She has a history of GI bleed secondary to gastric ulcer.  Received PRBC  transfusion.  Patient is on Eliquis 2.5 mg twice daily. No aggravating or improving factors.  Last endoscopy: 04/13/2019 EGD colonoscopy and small bowel capsule study during her hospitalization.  EGD was negative, 04/16/2019 colonoscopy with small polyps removed, 04/18/2019 small bowel capsule study did not reach this small bowel and was inconclusive.   History of recurrent acute GI bleeding/melena, history of PUD on chronic anticoagulation. 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    RA, follows up with rheumatology Dr.Patel. On MTX with folic acid and plaquenil.   11/05/2021-11/09/2021, patient was hospitalized due to peripheral artery disease, status post left below-knee amputation. Patient is  on Eliquis 5 mg twice daily as well as aspirin 81 mg daily.  INTERVAL HISTORY Sylvia [name] is a 67 y.o. female who has above history reviewed by me today presents for follow up visit for management of anemia Patient reports feeling okay.  Chronic fatigue unchanged. She has no new complaints.  Review of Systems  Constitutional:  Positive for fatigue. Negative for appetite change, chills, fever and unexpected weight change.  HENT:   Negative for hearing loss and voice change.   Eyes:  Negative for eye problems.  Respiratory:  Negative for chest tightness and cough.   Cardiovascular:  Negative for chest pain.  Gastrointestinal:  Negative for abdominal distention, abdominal pain and blood in stool.  Endocrine: Negative for hot flashes.  Genitourinary:  Negative for difficulty urinating and frequency.   Musculoskeletal:  Negative for  arthralgias.  Skin:  Negative for itching and rash.  Neurological:  Negative for extremity weakness.  Hematological:  Negative for adenopathy.  Psychiatric/Behavioral:  Negative for confusion.     MEDICAL HISTORY:  Past Medical History:  Diagnosis Date   Anemia of chronic disease 05/16/2019   Anxiety    h/o   Arthritis    Chronic kidney disease     Cocaine abuse (HCC)    COPD (chronic obstructive pulmonary disease) (HCC)    Coronary artery disease    Depression    Diabetes mellitus without complication (HCC)    DKA (diabetic ketoacidosis) (HCC)    Elevated troponin    GERD (gastroesophageal reflux disease)    GI bleed    Hypertension    bp under control-off meds since 2019   Ischemic leg     SURGICAL HISTORY: Past Surgical History:  Procedure Laterality Date   ABDOMINAL HYSTERECTOMY     AMPUTATION Right 12/27/2018   Procedure: AMPUTATION BELOW KNEE;  Surgeon: Annice Needy, MD;  Location: [facility];  Service: General;  Laterality: Right;   AMPUTATION Left 11/05/2021   Procedure: AMPUTATION BELOW KNEE;  Surgeon: Annice Needy, MD;  Location: [facility];  Service: Vascular;  Laterality: Left;   AMPUTATION Left 11/04/2022   Procedure: AMPUTATION ABOVE KNEE;  Surgeon: Annice Needy, MD;  Location: [facility];  Service: Vascular;  Laterality: Left;   APPLICATION OF WOUND VAC  10/16/2021   Procedure: APPLICATION OF WOUND VAC;  Surgeon: Annice Needy, MD;  Location: [facility];  Service: Vascular;;   CATARACT EXTRACTION W/PHACO Right 03/27/2020   Procedure: CATARACT EXTRACTION PHACO AND INTRAOCULAR LENS PLACEMENT (IOC) RIGHT DIABETIC 7.54 00:52.5;  Surgeon: Galen Manila, MD;  Location: [facility];  Service: Ophthalmology;  Laterality: Right;   CATARACT EXTRACTION W/PHACO Left 05/20/2020   Procedure: CATARACT EXTRACTION PHACO AND INTRAOCULAR LENS PLACEMENT (IOC) LEFT DIABETIC 4.95 00:37.6;  Surgeon: Galen Manila, MD;  Location: [facility];  Service: Ophthalmology;  Laterality: Left;  Diabetic - oral meds COVID + 04-24-20   CHOLECYSTECTOMY     COLONOSCOPY WITH PROPOFOL N/A 04/16/2019   Procedure: COLONOSCOPY WITH PROPOFOL;  Surgeon: Pasty Spillers, MD;  Location: [facility];  Service: Endoscopy;  Laterality: N/A;   COLONOSCOPY WITH PROPOFOL N/A 01/16/2020   Procedure: COLONOSCOPY WITH PROPOFOL;  Surgeon:  Pasty Spillers, MD;  Location: [facility];  Service: Endoscopy;  Laterality: N/A;   ENDARTERECTOMY FEMORAL Left 10/16/2021   Procedure: ENDARTERECTOMY FEMORAL;  Surgeon: Annice Needy, MD;  Location: [facility];  Service: Vascular;  Laterality: Left;   ESOPHAGOGASTRODUODENOSCOPY (EGD) WITH PROPOFOL N/A 06/15/2018   Procedure: ESOPHAGOGASTRODUODENOSCOPY (EGD) WITH PROPOFOL;  Surgeon: Toledo, Boykin Nearing, MD;  Location: [facility];  Service: Gastroenterology;  Laterality: N/A;   ESOPHAGOGASTRODUODENOSCOPY (EGD) WITH PROPOFOL N/A 01/03/2019   Procedure: ESOPHAGOGASTRODUODENOSCOPY (EGD) WITH PROPOFOL;  Surgeon: Midge Minium, MD;  Location: [facility];  Service: Endoscopy;  Laterality: N/A;   ESOPHAGOGASTRODUODENOSCOPY (EGD) WITH PROPOFOL N/A 09/25/2021   Procedure: ESOPHAGOGASTRODUODENOSCOPY (EGD) WITH PROPOFOL;  Surgeon: Midge Minium, MD;  Location: [facility];  Service: Endoscopy;  Laterality: N/A;   EYE SURGERY Bilateral    GIVENS CAPSULE STUDY  04/16/2019   Procedure: GIVENS CAPSULE STUDY;  Surgeon: Pasty Spillers, MD;  Location: [facility];  Service: Endoscopy;;   GIVENS CAPSULE STUDY N/A 06/25/2019   Procedure: GIVENS CAPSULE STUDY;  Surgeon: Wyline Mood, MD;  Location: [facility];  Service: Gastroenterology;  Laterality: N/A;   INCISION AND DRAINAGE OF WOUND  Left 02/24/2022   Procedure: LEFT BKA IRRIGATION AND DEBRIDEMENT WOUND;  Surgeon: Annice Needy, MD;  Location: [facility];  Service: Vascular;  Laterality: Left;   LOWER EXTREMITY ANGIOGRAPHY Right 08/21/2018   Procedure: LOWER EXTREMITY ANGIOGRAPHY;  Surgeon: Annice Needy, MD;  Location: [facility];  Service: Cardiovascular;  Laterality: Right;   LOWER EXTREMITY ANGIOGRAPHY Left 08/28/2018   Procedure: LOWER EXTREMITY ANGIOGRAPHY;  Surgeon: Annice Needy, MD;  Location: [facility];  Service: Cardiovascular;  Laterality: Left;   LOWER EXTREMITY ANGIOGRAPHY Right 08/28/2018   Procedure: Lower  Extremity Angiography;  Surgeon: Annice Needy, MD;  Location: [facility];  Service: Cardiovascular;  Laterality: Right;   LOWER EXTREMITY ANGIOGRAPHY Right 12/18/2018   Procedure: Lower Extremity Angiography;  Surgeon: Annice Needy, MD;  Location: [facility];  Service: Cardiovascular;  Laterality: Right;   LOWER EXTREMITY ANGIOGRAPHY Right 12/18/2018   Procedure: Lower Extremity Angiography;  Surgeon: Annice Needy, MD;  Location: [facility];  Service: Cardiovascular;  Laterality: Right;   LOWER EXTREMITY ANGIOGRAPHY Left 12/21/2018   Procedure: Lower Extremity Angiography;  Surgeon: Annice Needy, MD;  Location: [facility];  Service: Cardiovascular;  Laterality: Left;   LOWER EXTREMITY ANGIOGRAPHY Right 12/21/2018   Procedure: Lower Extremity Angiography;  Surgeon: Annice Needy, MD;  Location: [facility];  Service: Cardiovascular;  Laterality: Right;   LOWER EXTREMITY ANGIOGRAPHY Left 12/25/2020   Procedure: LOWER EXTREMITY ANGIOGRAPHY;  Surgeon: Annice Needy, MD;  Location: [facility];  Service: Cardiovascular;  Laterality: Left;   LOWER EXTREMITY ANGIOGRAPHY Left 09/21/2021   Procedure: Lower Extremity Angiography;  Surgeon: Annice Needy, MD;  Location: [facility];  Service: Cardiovascular;  Laterality: Left;   LOWER EXTREMITY ANGIOGRAPHY Left 10/13/2021   Procedure: Lower Extremity Angiography;  Surgeon: Renford Dills, MD;  Location: [facility];  Service: Cardiovascular;  Laterality: Left;   LOWER EXTREMITY ANGIOGRAPHY Left 10/14/2021   Procedure: Lower Extremity Angiography;  Surgeon: Annice Needy, MD;  Location: [facility];  Service: Cardiovascular;  Laterality: Left;   LOWER EXTREMITY ANGIOGRAPHY Left 11/02/2021   Procedure: Lower Extremity Angiography;  Surgeon: Annice Needy, MD;  Location: [facility];  Service: Cardiovascular;  Laterality: Left;   LOWER EXTREMITY INTERVENTION N/A  12/22/2018   Procedure: LOWER EXTREMITY INTERVENTION;  Surgeon: Annice Needy, MD;  Location: [facility];  Service: Cardiovascular;  Laterality: N/A;   LOWER EXTREMITY INTERVENTION Left 12/26/2020   Procedure: LOWER EXTREMITY INTERVENTION;  Surgeon: Annice Needy, MD;  Location: [facility];  Service: Cardiovascular;  Laterality: Left;   LOWER EXTREMITY INTERVENTION N/A 09/22/2021   Procedure: LOWER EXTREMITY INTERVENTION;  Surgeon: Annice Needy, MD;  Location: [facility];  Service: Cardiovascular;  Laterality: N/A;    SOCIAL HISTORY: Social History   Socioeconomic History   Marital status: Divorced    Spouse name: Not on file   Number of children: 2   Years of education: Not on file   Highest education level: Not on file  Occupational History   Not on file  Tobacco Use   Smoking status: Former    Current packs/day: 0.25    Average packs/day: 0.3 packs/day for 45.0 years (11.3 ttl pk-yrs)    Types: Cigarettes   Smokeless tobacco: Never   Tobacco comments:    quit  Vaping Use   Vaping status:  Never Used  Substance and Sexual Activity   Alcohol use: No   Drug use: Not Currently    Types: Cocaine    Comment: last used in April 2021 per patient-last 2 drug tests have been negative for cocaine   Sexual activity: Not Currently  Other Topics Concern   Not on file  Social History Narrative   ** Merged History Encounter **       5 grandchildren and 5 great grandchildren  2 grandchildren live with Pt. (25 & 75 y.o.)   Social Determinants of Health   Financial Resource Strain: Low Risk  (06/14/2018)   Overall Financial Resource Strain (CARDIA)    Difficulty of Paying Living Expenses: Not hard at all  Food Insecurity: No Food Insecurity (11/04/2022)   Hunger Vital Sign    Worried About Running Out of Food in the Last Year: Never true    Ran Out of Food in the Last Year: Never true  Transportation Needs: No Transportation Needs (11/04/2022)   PRAPARE -  Administrator, Civil Service (Medical): No    Lack of Transportation (Non-Medical): No  Physical Activity: Insufficiently Active (06/14/2018)   Exercise Vital Sign    Days of Exercise per Week: 4 days    Minutes of Exercise per Session: 20 min  Stress: No Stress Concern Present (06/14/2018)   [employer] of Occupational Health - Occupational Stress Questionnaire    Feeling of Stress : Only a little  Social Connections: Unknown (06/14/2018)   Social Connection and Isolation Panel [NHANES]    Frequency of Communication with Friends and Family: Patient declined    Frequency of Social Gatherings with Friends and Family: Patient declined    Attends Religious Services: Patient declined    Database administrator or Organizations: Patient declined    Attends Banker Meetings: Patient declined    Marital Status: Patient declined  Intimate Partner Violence: Not At Risk (11/04/2022)   Humiliation, Afraid, Rape, and Kick questionnaire    Fear of Current or Ex-Partner: No    Emotionally Abused: No    Physically Abused: No    Sexually Abused: No    FAMILY HISTORY: Family History  Problem Relation Age of Onset   Breast cancer Mother 37    ALLERGIES:  is allergic to vancomycin, hydrocodone, metformin and related, penicillins, and tramadol.  MEDICATIONS:  Current Outpatient Medications  Medication Sig Dispense Refill   acetaminophen (TYLENOL) 500 MG tablet Take 500 mg by mouth every 6 (six) hours as needed.     albuterol (PROVENTIL HFA;VENTOLIN HFA) 108 (90 Base) MCG/ACT inhaler Inhale 2 puffs into the lungs every 6 (six) hours as needed for wheezing or shortness of breath. 1 Inhaler 2   apixaban (ELIQUIS) 5 MG TABS tablet Take 1 tablet (5 mg total) by mouth 2 (two) times daily. 60 tablet 12   atorvastatin (LIPITOR) 10 MG tablet Take 10 mg by mouth at bedtime.     cetirizine (ZYRTEC) 10 MG tablet Take 10 mg by mouth daily as needed for allergies.      cyclobenzaprine (FLEXERIL) 10 MG tablet Take 10 mg by mouth 3 (three) times daily as needed for muscle spasms. Take 1/2 a tablet as needed for pain     DULoxetine (CYMBALTA) 60 MG capsule Take 1 capsule (60 mg total) by mouth daily. 30 capsule 3   ferrous sulfate 325 (65 FE) MG tablet Take 325 mg by mouth daily.      gabapentin (NEURONTIN) 300 MG  capsule TAKE 3 CAPSULES BY MOUTH IN THE  MORNING AND 3 CAPSULES BY MOUTH  IN THE EVENING 450 capsule 3   hydroxychloroquine (PLAQUENIL) 200 MG tablet Take 200 mg by mouth daily.     lisinopril (ZESTRIL) 20 MG tablet Take 20 mg by mouth daily.     Multiple Vitamins-Minerals (WOMENS MULTIVITAMIN PO) Take 1 tablet by mouth daily.     oxyCODONE (ROXICODONE) 15 MG immediate release tablet Take 1 tablet (15 mg total) by mouth every 6 (six) hours as needed for pain. 30 tablet 0   pantoprazole (PROTONIX) 40 MG tablet Take 1 tablet (40 mg total) by mouth daily. 30 tablet 11   sitaGLIPtin (JANUVIA) 100 MG tablet Take 100 mg by mouth every morning.     VICTOZA 18 MG/3ML SOPN Inject 0.6 mg into the skin daily.     vitamin B-12 (CYANOCOBALAMIN) 500 MCG tablet Take 1 tablet (500 mcg total) by mouth daily. 90 tablet 1   VOLTAREN ARTHRITIS PAIN 1 % GEL Apply 2 g topically 4 (four) times daily as needed.     atorvastatin (LIPITOR) 80 MG tablet Take 80 mg by mouth daily. (Patient not taking: Reported on 12/15/2022)     No current facility-administered medications for this visit.     PHYSICAL EXAMINATION: ECOG PERFORMANCE STATUS: 1 - Symptomatic but completely ambulatory Vitals:   12/15/22 1353  BP: (!) 155/78  Pulse: (!) 106  Resp: 18  Temp: (!) 97.2 F (36.2 C)  SpO2: 100%   Filed Weights   12/15/22 1353  Weight: 124 lb 12.8 oz (56.6 kg)    Physical Exam Constitutional:      General: She is not in acute distress.    Comments: Patient walks with a cane  HENT:     Head: Normocephalic and atraumatic.  Eyes:     General: No scleral  icterus. Cardiovascular:     Rate and Rhythm: Normal rate.  Pulmonary:     Effort: Pulmonary effort is normal. No respiratory distress.     Breath sounds: No wheezing.  Abdominal:     General: Bowel sounds are normal. There is no distension.     Palpations: Abdomen is soft.  Musculoskeletal:        General: No deformity. Normal range of motion.     Cervical back: Normal range of motion and neck supple.     Comments: History of right lower extremity below-knee amputation Status post left below-knee amputation, wound covered with dressing  Skin:    General: Skin is warm and dry.     Findings: No rash.  Neurological:     Mental Status: She is alert and oriented to person, place, and time. Mental status is at baseline.  Psychiatric:        Mood and Affect: Mood normal.      LABORATORY DATA:  I have reviewed the data as listed    Latest Ref Rng & Units 12/10/2022   11:49 AM 11/07/2022    4:44 AM 11/06/2022   10:02 PM  CBC  WBC 4.0 - 10.5 K/uL 8.2  10.5    Hemoglobin 12.0 - 15.0 g/dL 82.9  8.3  8.4   Hematocrit 36.0 - 46.0 % 29.8  24.2  24.7   Platelets 150 - 400 K/uL 303  288        Latest Ref Rng & Units 11/07/2022    4:44 AM 11/06/2022    4:39 AM 11/05/2022    9:03 AM  CMP  Glucose 70 -  99 mg/dL 409  811  914   BUN 8 - 23 mg/dL 18  13  14    Creatinine 0.44 - 1.00 mg/dL 7.82  9.56  2.13   Sodium 135 - 145 mmol/L 138  140  141   Potassium 3.5 - 5.1 mmol/L 3.5  3.6  3.7   Chloride 98 - 111 mmol/L 110  112  112   CO2 22 - 32 mmol/L 23  23  21    Calcium 8.9 - 10.3 mg/dL 8.8  8.7  8.9   Total Protein 6.5 - 8.1 g/dL 6.4     Total Bilirubin 0.3 - 1.2 mg/dL 0.7     Alkaline Phos 38 - 126 U/L 80     AST 15 - 41 U/L 12     ALT 0 - 44 U/L 10        Iron/TIBC/Ferritin/ %Sat    Component Value Date/Time   IRON 91 12/10/2022 1149   TIBC 318 12/10/2022 1149   FERRITIN 95 12/10/2022 1149   IRONPCTSAT 29 12/10/2022 1149

## 2022-12-15 NOTE — Assessment & Plan Note (Addendum)
Anemia of chronic disease - RA and CKD. Labs are reviewed and discussed with patient.   Lab Results  Component Value Date   HGB 10.1 (L) 12/10/2022   TIBC 318 12/10/2022   IRONPCTSAT 29 12/10/2022   FERRITIN 95 12/10/2022    Hemoglobin >10. Recommend patient to proceed with Venofer 200 mg x 3 to further increase iron stores. No need for erythropoietin replacement therapy currently.

## 2022-12-21 ENCOUNTER — Other Ambulatory Visit (INDEPENDENT_AMBULATORY_CARE_PROVIDER_SITE_OTHER): Payer: Self-pay | Admitting: Nurse Practitioner

## 2022-12-23 ENCOUNTER — Inpatient Hospital Stay: Payer: 59 | Attending: Oncology

## 2022-12-23 ENCOUNTER — Other Ambulatory Visit (INDEPENDENT_AMBULATORY_CARE_PROVIDER_SITE_OTHER): Payer: Self-pay | Admitting: Nurse Practitioner

## 2022-12-23 ENCOUNTER — Telehealth (INDEPENDENT_AMBULATORY_CARE_PROVIDER_SITE_OTHER): Payer: Self-pay

## 2022-12-23 VITALS — BP 146/74 | HR 89 | Temp 95.7°F | Resp 19

## 2022-12-23 DIAGNOSIS — D631 Anemia in chronic kidney disease: Secondary | ICD-10-CM | POA: Diagnosis present

## 2022-12-23 DIAGNOSIS — D638 Anemia in other chronic diseases classified elsewhere: Secondary | ICD-10-CM

## 2022-12-23 DIAGNOSIS — N189 Chronic kidney disease, unspecified: Secondary | ICD-10-CM | POA: Insufficient documentation

## 2022-12-23 MED ORDER — OXYCODONE HCL 15 MG PO TABS: 15.0000 mg | ORAL_TABLET | Freq: Four times a day (QID) | ORAL | 0 refills | Status: DC | PRN

## 2022-12-23 MED ORDER — SODIUM CHLORIDE 0.9 % IV SOLN
INTRAVENOUS | Status: DC
  Filled 2022-12-23: qty 250

## 2022-12-23 MED ORDER — SODIUM CHLORIDE 0.9 % IV SOLN
200.0000 mg | Freq: Once | INTRAVENOUS | Status: AC
  Administered 2022-12-23: 200 mg via INTRAVENOUS
  Filled 2022-12-23: qty 200

## 2022-12-23 NOTE — Progress Notes (Unsigned)
dme

## 2022-12-23 NOTE — Telephone Encounter (Signed)
Sylvia [name] from [facility] reach out stating that the patient is requesting for pain medication refill. The patient informed that the pain level is 8 out 10. Also she was checking the status of order for sliding board and the patient will be discharge from home health next week.

## 2022-12-24 NOTE — Telephone Encounter (Signed)
I sent in a refill but because her wounds are healed, we will be starting to titrate her pain medication down.  Are they saying they sent an order (because I haven't seen it) or they need an order?

## 2022-12-24 NOTE — Telephone Encounter (Signed)
Patient notified prescription has been sent and with medical recommendations. Patient did informed that she will contact the office if the tenderness does not improve before upcoming appointment

## 2022-12-30 ENCOUNTER — Inpatient Hospital Stay: Payer: 59

## 2022-12-30 VITALS — BP 144/84 | HR 100 | Resp 18

## 2022-12-30 DIAGNOSIS — N189 Chronic kidney disease, unspecified: Secondary | ICD-10-CM | POA: Diagnosis not present

## 2022-12-30 DIAGNOSIS — D638 Anemia in other chronic diseases classified elsewhere: Secondary | ICD-10-CM

## 2022-12-30 MED ORDER — SODIUM CHLORIDE 0.9 % IV SOLN
200.0000 mg | Freq: Once | INTRAVENOUS | Status: AC
  Administered 2022-12-30: 200 mg via INTRAVENOUS
  Filled 2022-12-30: qty 200

## 2022-12-30 MED ORDER — SODIUM CHLORIDE 0.9 % IV SOLN
Freq: Once | INTRAVENOUS | Status: AC
  Filled 2022-12-30: qty 250

## 2023-01-13 ENCOUNTER — Telehealth (INDEPENDENT_AMBULATORY_CARE_PROVIDER_SITE_OTHER): Payer: Self-pay

## 2023-01-13 NOTE — Telephone Encounter (Signed)
I am not going to send her pain medication because I'm concerned she may something broken or fractured, especially since the pain is radiating to her hip.  She should be seen in the ED

## 2023-01-13 NOTE — Telephone Encounter (Signed)
Patient spouse and patient was notified with medical recommendations

## 2023-01-13 NOTE — Telephone Encounter (Signed)
Patient reach out to inform that she fell last night and is in a lot of pain. The left stump is swollen and painful up towards the hip. Patient also is having shoulder pain from fall but I advise her to contact PCP. Patient spouse recommended patient to be seen at the ED but patient refused. Patient is requesting for pain medication for relief.

## 2023-02-08 ENCOUNTER — Encounter (INDEPENDENT_AMBULATORY_CARE_PROVIDER_SITE_OTHER): Payer: Self-pay | Admitting: Vascular Surgery

## 2023-02-08 ENCOUNTER — Ambulatory Visit (INDEPENDENT_AMBULATORY_CARE_PROVIDER_SITE_OTHER): Payer: 59 | Admitting: Vascular Surgery

## 2023-02-08 VITALS — BP 167/92 | HR 98 | Resp 18 | Ht 67.0 in | Wt 124.0 lb

## 2023-02-08 DIAGNOSIS — S78119A Complete traumatic amputation at level between unspecified hip and knee, initial encounter: Secondary | ICD-10-CM

## 2023-02-08 DIAGNOSIS — I70229 Atherosclerosis of native arteries of extremities with rest pain, unspecified extremity: Secondary | ICD-10-CM | POA: Diagnosis not present

## 2023-02-08 DIAGNOSIS — E1142 Type 2 diabetes mellitus with diabetic polyneuropathy: Secondary | ICD-10-CM | POA: Diagnosis not present

## 2023-02-08 DIAGNOSIS — I10 Essential (primary) hypertension: Secondary | ICD-10-CM

## 2023-02-08 DIAGNOSIS — T8789 Other complications of amputation stump: Secondary | ICD-10-CM

## 2023-02-08 MED ORDER — CYCLOBENZAPRINE HCL 10 MG PO TABS: 10.0000 mg | ORAL_TABLET | Freq: Three times a day (TID) | ORAL | 1 refills | Status: AC | PRN

## 2023-02-08 NOTE — Assessment & Plan Note (Addendum)
On the left.  Healed.  Her pain is actually up in her hip and I going to refer her to an orthopedic specialist.  She seems to have a lot of muscle spasms and then going to call her in some muscle relaxers to try to help with that.

## 2023-02-08 NOTE — Assessment & Plan Note (Signed)
Status post right BKA in years past.  Left BKA had to be revised to a left AKA a little over 3 months ago due to a contracture with wounds.  Having a ton of hip pain on that left side.  Her stump itself is healed up nicely.  I am going to refer her to an orthopedic specialist to see if there is anything they can do for her hip pain.  Will be seeing her back in about 3 months.  She is already seeing the prosthesis specialist and wearing her stump shrinker.

## 2023-02-08 NOTE — Progress Notes (Signed)
MRN : [id number]  Sylvia [name] is a 67 y.o. (07-Mar-1956) female who presents with chief complaint of  Chief Complaint  Patient presents with   Follow-up    6 month no studies.  .  History of Present Illness: Patient returns today in follow up of PAD and amputations.  She is having a lot of pain on the left side but this is largely up in the hip area.  She had to have her left below-knee amputation revised to an above-knee amputation a little over 3 months ago for a contracture with wounds.  It has healed well.  She is in the process of trying to get her prosthesis.  She is not sleeping well at night due to the pain.  Current Outpatient Medications  Medication Sig Dispense Refill   acetaminophen (TYLENOL) 500 MG tablet Take 500 mg by mouth every 6 (six) hours as needed.     albuterol (PROVENTIL HFA;VENTOLIN HFA) 108 (90 Base) MCG/ACT inhaler Inhale 2 puffs into the lungs every 6 (six) hours as needed for wheezing or shortness of breath. 1 Inhaler 2   apixaban (ELIQUIS) 5 MG TABS tablet Take 1 tablet (5 mg total) by mouth 2 (two) times daily. 60 tablet 12   atorvastatin (LIPITOR) 10 MG tablet Take 10 mg by mouth at bedtime.     cetirizine (ZYRTEC) 10 MG tablet Take 10 mg by mouth daily as needed for allergies.     DULoxetine (CYMBALTA) 60 MG capsule Take 1 capsule (60 mg total) by mouth daily. 30 capsule 3   ferrous sulfate 325 (65 FE) MG tablet Take 325 mg by mouth daily.      folic acid (FOLVITE) 1 MG tablet Take 1 mg by mouth daily.     gabapentin (NEURONTIN) 300 MG capsule TAKE 3 CAPSULES BY MOUTH IN THE  MORNING AND 3 CAPSULES BY MOUTH  IN THE EVENING 450 capsule 3   hydroxychloroquine (PLAQUENIL) 200 MG tablet Take 200 mg by mouth daily.     lisinopril (ZESTRIL) 20 MG tablet Take 20 mg by mouth daily.     methotrexate (RHEUMATREX) 2.5 MG tablet Take 2.5 mg by mouth once a week.     Multiple Vitamins-Minerals (WOMENS MULTIVITAMIN PO) Take 1 tablet by mouth daily.     oxyCODONE  (ROXICODONE) 15 MG immediate release tablet Take 1 tablet (15 mg total) by mouth every 6 (six) hours as needed for pain. 30 tablet 0   pantoprazole (PROTONIX) 40 MG tablet Take 1 tablet (40 mg total) by mouth daily. 30 tablet 11   sitaGLIPtin (JANUVIA) 100 MG tablet Take 100 mg by mouth every morning.     VICTOZA 18 MG/3ML SOPN Inject 0.6 mg into the skin daily.     vitamin B-12 (CYANOCOBALAMIN) 500 MCG tablet Take 1 tablet (500 mcg total) by mouth daily. 90 tablet 1   VOLTAREN ARTHRITIS PAIN 1 % GEL Apply 2 g topically 4 (four) times daily as needed.     atorvastatin (LIPITOR) 80 MG tablet Take 80 mg by mouth daily. (Patient not taking: Reported on 12/15/2022)     cyclobenzaprine (FLEXERIL) 10 MG tablet Take 1 tablet (10 mg total) by mouth 3 (three) times daily as needed for muscle spasms. Take 1/2 a tablet as needed for pain 90 tablet 1   No current facility-administered medications for this visit.    Past Medical History:  Diagnosis Date   Anemia of chronic disease 05/16/2019   Anxiety    h/o  Arthritis    Chronic kidney disease    Cocaine abuse (HCC)    COPD (chronic obstructive pulmonary disease) (HCC)    Coronary artery disease    Depression    Diabetes mellitus without complication (HCC)    DKA (diabetic ketoacidosis) (HCC)    Elevated troponin    GERD (gastroesophageal reflux disease)    GI bleed    Hypertension    bp under control-off meds since 2019   Ischemic leg     Past Surgical History:  Procedure Laterality Date   ABDOMINAL HYSTERECTOMY     AMPUTATION Right 12/27/2018   Procedure: AMPUTATION BELOW KNEE;  Surgeon: Annice Needy, MD;  Location: [facility];  Service: General;  Laterality: Right;   AMPUTATION Left 11/05/2021   Procedure: AMPUTATION BELOW KNEE;  Surgeon: Annice Needy, MD;  Location: [facility];  Service: Vascular;  Laterality: Left;   AMPUTATION Left 11/04/2022   Procedure: AMPUTATION ABOVE KNEE;  Surgeon: Annice Needy, MD;  Location: [facility];  Service:  Vascular;  Laterality: Left;   APPLICATION OF WOUND VAC  10/16/2021   Procedure: APPLICATION OF WOUND VAC;  Surgeon: Annice Needy, MD;  Location: [facility];  Service: Vascular;;   CATARACT EXTRACTION W/PHACO Right 03/27/2020   Procedure: CATARACT EXTRACTION PHACO AND INTRAOCULAR LENS PLACEMENT (IOC) RIGHT DIABETIC 7.54 00:52.5;  Surgeon: Galen Manila, MD;  Location: [facility];  Service: Ophthalmology;  Laterality: Right;   CATARACT EXTRACTION W/PHACO Left 05/20/2020   Procedure: CATARACT EXTRACTION PHACO AND INTRAOCULAR LENS PLACEMENT (IOC) LEFT DIABETIC 4.95 00:37.6;  Surgeon: Galen Manila, MD;  Location: [facility];  Service: Ophthalmology;  Laterality: Left;  Diabetic - oral meds COVID + 04-24-20   CHOLECYSTECTOMY     COLONOSCOPY WITH PROPOFOL N/A 04/16/2019   Procedure: COLONOSCOPY WITH PROPOFOL;  Surgeon: Pasty Spillers, MD;  Location: [facility];  Service: Endoscopy;  Laterality: N/A;   COLONOSCOPY WITH PROPOFOL N/A 01/16/2020   Procedure: COLONOSCOPY WITH PROPOFOL;  Surgeon: Pasty Spillers, MD;  Location: [facility];  Service: Endoscopy;  Laterality: N/A;   ENDARTERECTOMY FEMORAL Left 10/16/2021   Procedure: ENDARTERECTOMY FEMORAL;  Surgeon: Annice Needy, MD;  Location: [facility];  Service: Vascular;  Laterality: Left;   ESOPHAGOGASTRODUODENOSCOPY (EGD) WITH PROPOFOL N/A 06/15/2018   Procedure: ESOPHAGOGASTRODUODENOSCOPY (EGD) WITH PROPOFOL;  Surgeon: Toledo, Boykin Nearing, MD;  Location: [facility];  Service: Gastroenterology;  Laterality: N/A;   ESOPHAGOGASTRODUODENOSCOPY (EGD) WITH PROPOFOL N/A 01/03/2019   Procedure: ESOPHAGOGASTRODUODENOSCOPY (EGD) WITH PROPOFOL;  Surgeon: Midge Minium, MD;  Location: [facility];  Service: Endoscopy;  Laterality: N/A;   ESOPHAGOGASTRODUODENOSCOPY (EGD) WITH PROPOFOL N/A 09/25/2021   Procedure: ESOPHAGOGASTRODUODENOSCOPY (EGD) WITH PROPOFOL;  Surgeon: Midge Minium, MD;  Location: [facility];   Service: Endoscopy;  Laterality: N/A;   EYE SURGERY Bilateral    GIVENS CAPSULE STUDY  04/16/2019   Procedure: GIVENS CAPSULE STUDY;  Surgeon: Pasty Spillers, MD;  Location: [facility];  Service: Endoscopy;;   GIVENS CAPSULE STUDY N/A 06/25/2019   Procedure: GIVENS CAPSULE STUDY;  Surgeon: Wyline Mood, MD;  [facility];  Service: Gastroenterology;  Laterality: N/A;   INCISION AND DRAINAGE OF WOUND Left 02/24/2022   Procedure: LEFT BKA IRRIGATION AND DEBRIDEMENT WOUND;  Surgeon: Annice Needy, MD;  Location: [facility];  Service: Vascular;  Laterality: Left;   LOWER EXTREMITY ANGIOGRAPHY Right 08/21/2018   Procedure: LOWER EXTREMITY ANGIOGRAPHY;  Surgeon: Annice Needy, MD;  Location: [facility];  Service: Cardiovascular;  Laterality: Right;  LOWER EXTREMITY ANGIOGRAPHY Left 08/28/2018   Procedure: LOWER EXTREMITY ANGIOGRAPHY;  Surgeon: Annice Needy, MD;  Location: [facility];  Service: Cardiovascular;  Laterality: Left;   LOWER EXTREMITY ANGIOGRAPHY Right 08/28/2018   Procedure: Lower Extremity Angiography;  Surgeon: Annice Needy, MD;  Location: [facility];  Service: Cardiovascular;  Laterality: Right;   LOWER EXTREMITY ANGIOGRAPHY Right 12/18/2018   Procedure: Lower Extremity Angiography;  Surgeon: Annice Needy, MD;  Location: [facility];  Service: Cardiovascular;  Laterality: Right;   LOWER EXTREMITY ANGIOGRAPHY Right 12/18/2018   Procedure: Lower Extremity Angiography;  Surgeon: Annice Needy, MD;  Location: [facility];  Service: Cardiovascular;  Laterality: Right;   LOWER EXTREMITY ANGIOGRAPHY Left 12/21/2018   Procedure: Lower Extremity Angiography;  Surgeon: Annice Needy, MD;  Location: [facility];  Service: Cardiovascular;  Laterality: Left;   LOWER EXTREMITY ANGIOGRAPHY Right 12/21/2018   Procedure: Lower Extremity Angiography;  Surgeon: Annice Needy, MD;  Location: [facility];  Service:  Cardiovascular;  Laterality: Right;   LOWER EXTREMITY ANGIOGRAPHY Left 12/25/2020   Procedure: LOWER EXTREMITY ANGIOGRAPHY;  Surgeon: Annice Needy, MD;  Location: [facility];  Service: Cardiovascular;  Laterality: Left;   LOWER EXTREMITY ANGIOGRAPHY Left 09/21/2021   Procedure: Lower Extremity Angiography;  Surgeon: Annice Needy, MD;  Location: [facility];  Service: Cardiovascular;  Laterality: Left;   LOWER EXTREMITY ANGIOGRAPHY Left 10/13/2021   Procedure: Lower Extremity Angiography;  Surgeon: Renford Dills, MD;  Location: [facility];  Service: Cardiovascular;  Laterality: Left;   LOWER EXTREMITY ANGIOGRAPHY Left 10/14/2021   Procedure: Lower Extremity Angiography;  Surgeon: Annice Needy, MD;  Location: [facility];  Service: Cardiovascular;  Laterality: Left;   LOWER EXTREMITY ANGIOGRAPHY Left 11/02/2021   Procedure: Lower Extremity Angiography;  Surgeon: Annice Needy, MD;  Location: [facility];  Service: Cardiovascular;  Laterality: Left;   LOWER EXTREMITY INTERVENTION N/A 12/22/2018   Procedure: LOWER EXTREMITY INTERVENTION;  Surgeon: Annice Needy, MD;  Location: [facility];  Service: Cardiovascular;  Laterality: N/A;   LOWER EXTREMITY INTERVENTION Left 12/26/2020   Procedure: LOWER EXTREMITY INTERVENTION;  Surgeon: Annice Needy, MD;  Location: [facility];  Service: Cardiovascular;  Laterality: Left;   LOWER EXTREMITY INTERVENTION N/A 09/22/2021   Procedure: LOWER EXTREMITY INTERVENTION;  Surgeon: Annice Needy, MD;  Location: [facility];  Service: Cardiovascular;  Laterality: N/A;     Social History   Tobacco Use   Smoking status: Former    Current packs/day: 0.25    Average packs/day: 0.3 packs/day for 45.0 years (11.3 ttl pk-yrs)    Types: Cigarettes   Smokeless tobacco: Never   Tobacco comments:    quit  Vaping Use   Vaping status: Never Used  Substance Use Topics   Alcohol use: No   Drug  use: Not Currently    Types: Cocaine    Comment: last used in April 2021 per patient-last 2 drug tests have been negative for cocaine      Family History  Problem Relation Age of Onset   Breast cancer Mother 47     Allergies  Allergen Reactions   Vancomycin Rash    Patient developed a rash to injection site and arm a few mintes after starting ABX.    Hydrocodone Rash    Patient tolerates hydromorphone   Metformin And Related Diarrhea  Penicillins Hives   Tramadol Itching      REVIEW OF SYSTEMS (Negative unless checked)   Constitutional: [] Weight loss  [] Fever  [] Chills Cardiac: [] Chest pain   [] Chest pressure   [] Palpitations   [] Shortness of breath when laying flat   [] Shortness of breath at rest   [x] Shortness of breath with exertion. Vascular:  [] Pain in legs with walking   [] Pain in legs at rest   [] Pain in legs when laying flat   [] Claudication   [] Pain in feet when walking  [] Pain in feet at rest  [] Pain in feet when laying flat   [] History of DVT   [] Phlebitis   [] Swelling in legs   [] Varicose veins   [] Non-healing ulcers Pulmonary:   [] Uses home oxygen   [] Productive cough   [] Hemoptysis   [] Wheeze  [x] COPD   [] Asthma Neurologic:  [] Dizziness  [] Blackouts   [] Seizures   [] History of stroke   [] History of TIA  [] Aphasia   [] Temporary blindness   [] Dysphagia   [] Weakness or numbness in arms   [] Weakness or numbness in legs Musculoskeletal:  [x] Arthritis   [] Joint swelling   [] Joint pain   [] Low back pain Hematologic:  [] Easy bruising  [] Easy bleeding   [] Hypercoagulable state   [] Anemic   Gastrointestinal:  [] Blood in stool   [] Vomiting blood  [x] Gastroesophageal reflux/heartburn   [] Abdominal pain Genitourinary:  [] Chronic kidney disease   [] Difficult urination  [] Frequent urination  [] Burning with urination   [] Hematuria Skin:  [] Rashes   [x] Ulcers   [x] Wounds Psychological:  [] History of anxiety   []  History of major depression.    Physical Examination  BP (!)  167/92 (BP Location: Left Arm)   Pulse 98   Resp 18   Ht 5\' 7"  (1.702 m)   Wt 124 lb (56.2 kg)   BMI 19.42 kg/m  Gen:  WD/WN, NAD Head: [place]/AT, No temporalis wasting. Ear/Nose/Throat: Hearing grossly intact, nares w/o erythema or drainage Eyes: Conjunctiva clear. Sclera non-icteric Neck: Supple.  Trachea midline Pulmonary:  Good air movement, no use of accessory muscles.  Cardiac: RRR, no JVD Vascular:  Vessel Right Left  Radial Palpable Palpable               Musculoskeletal: M/S 5/5 throughout.  No deformity or atrophy.  Right BKA and left BKA both well-healed.  Left hip is painful. Neurologic: Sensation grossly intact in extremities.  Symmetrical.  Speech is fluent.  Psychiatric: Judgment intact, Mood & affect appropriate for pt's clinical situation. Dermatologic: No rashes or ulcers noted.  No cellulitis or open wounds.      Labs Recent Results (from the past 2160 hour(s))  Retic Panel     Status: Abnormal   Collection Time: 12/10/22 11:49 AM  Result Value Ref Range   Retic Ct Pct 1.4 0.4 - 3.1 %   RBC. 3.61 (L) 3.87 - 5.11 MIL/uL   Retic Count, Absolute 51.6 19.0 - 186.0 K/uL   Immature Retic Fract 9.0 2.3 - 15.9 %   Reticulocyte Hemoglobin 32.6 >27.9 pg    Comment:        Given the high negative predictive value of a RET-He result > 32 pg iron deficiency is essentially excluded. If this patient is anemic other etiologies should be considered. Performed at [facility], [address] [place], [place] [address]   Ferritin     Status: None   Collection Time: 12/10/22 11:49 AM  Result Value Ref Range   Ferritin 95 11 - 307 ng/mL  Comment: Performed at [facility], [address] [place], [place] [address]  Iron and TIBC     Status: None   Collection Time: 12/10/22 11:49 AM  Result Value Ref Range   Iron 91 28 - 170 ug/dL   TIBC 528 413 - 244 ug/dL   Saturation Ratios 29 10.4 - 31.8 %   UIBC 227 ug/dL    Comment: Performed at  [facility], [address] [place], [place] [address]  CBC with Differential (Cancer Center Only)     Status: Abnormal   Collection Time: 12/10/22 11:49 AM  Result Value Ref Range   WBC Count 8.2 4.0 - 10.5 K/uL   RBC 3.58 (L) 3.87 - 5.11 MIL/uL   Hemoglobin 10.1 (L) 12.0 - 15.0 g/dL   HCT 25.3 (L) 66.4 - 40.3 %   MCV 83.2 80.0 - 100.0 fL   MCH 28.2 26.0 - 34.0 pg   MCHC 33.9 30.0 - 36.0 g/dL   RDW 47.4 25.9 - 56.3 %   Platelet Count 303 150 - 400 K/uL   nRBC 0.0 0.0 - 0.2 %   Neutrophils Relative % 64 %   Neutro Abs 5.2 1.7 - 7.7 K/uL   Lymphocytes Relative 24 %   Lymphs Abs 2.0 0.7 - 4.0 K/uL   Monocytes Relative 6 %   Monocytes Absolute 0.5 0.1 - 1.0 K/uL   Eosinophils Relative 4 %   Eosinophils Absolute 0.3 0.0 - 0.5 K/uL   Basophils Relative 1 %   Basophils Absolute 0.1 0.0 - 0.1 K/uL   Immature Granulocytes 1 %   Abs Immature Granulocytes 0.10 (H) 0.00 - 0.07 K/uL    Comment: Performed at [facility], [address], [place], [place] [address]    Radiology No results found.  Assessment/Plan Essential hypertension blood pressure control important in reducing the progression of atherosclerotic disease. On appropriate oral medications.     Type 2 diabetes mellitus with peripheral neuropathy (HCC) blood glucose control important in reducing the progression of atherosclerotic disease. Also, involved in wound healing. On appropriate medications.  Likely contributing to neuropathic pain in the lower extremities.  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  Left BKA had to be revised to a left AKA a little over 3 months ago due to a contracture with wounds.  Having a ton of hip pain on that left side.  Her stump itself is healed up nicely.  I am going to refer her to an orthopedic specialist to see if there is anything they can do for her hip pain.  Will be seeing her back in about 3 months.  She is  already seeing the prosthesis specialist and wearing her stump shrinker.  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  Left BKA had to be revised to a left AKA a little over 3 months ago due to a contracture with wounds.  Having a ton of hip pain on that left side.  Her stump itself is healed up nicely.  I am going to refer her to an orthopedic specialist to see if there is anything they can do for her hip pain.  Will be seeing her back in about 3 months.  She is already seeing the prosthesis specialist and wearing her stump shrinker.  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  Healed.  Her pain is actually up in her hip  and I going to refer her to an orthopedic specialist.  She seems to have a lot of muscle spasms and then going to call her in some muscle relaxers to try to help with that.    [name], MD  02/08/2023 4:26 PM    This note was created with Dragon medical transcription system.  Any errors from dictation are purely unintentional

## 2023-02-10 ENCOUNTER — Telehealth (INDEPENDENT_AMBULATORY_CARE_PROVIDER_SITE_OTHER): Payer: Self-pay

## 2023-02-10 NOTE — Telephone Encounter (Signed)
Sylvia [name] from [facility] left message requesting orders for sliding board and last note to assist patient in physical therapy. Please Advise

## 2023-02-14 NOTE — Telephone Encounter (Signed)
faxed

## 2023-02-14 NOTE — Telephone Encounter (Signed)
Rx placed on your desk  

## 2023-02-21 ENCOUNTER — Encounter: Payer: Self-pay | Admitting: Cardiology

## 2023-02-21 ENCOUNTER — Ambulatory Visit: Payer: 59 | Attending: Cardiology | Admitting: Cardiology

## 2023-02-21 ENCOUNTER — Other Ambulatory Visit: Payer: Self-pay

## 2023-02-21 VITALS — BP 130/60 | HR 100 | Ht 67.0 in | Wt 160.0 lb

## 2023-02-21 DIAGNOSIS — R000 Tachycardia, unspecified: Secondary | ICD-10-CM | POA: Diagnosis not present

## 2023-02-21 DIAGNOSIS — I10 Essential (primary) hypertension: Secondary | ICD-10-CM | POA: Diagnosis not present

## 2023-02-21 DIAGNOSIS — F17200 Nicotine dependence, unspecified, uncomplicated: Secondary | ICD-10-CM

## 2023-02-21 MED ORDER — METOPROLOL SUCCINATE ER 25 MG PO TB24
25.0000 mg | ORAL_TABLET | Freq: Every day | ORAL | 3 refills | Status: DC
Start: 2023-02-21 — End: 2023-05-25
  Filled 2023-02-21: qty 90, 90d supply, fill #0

## 2023-02-21 MED ORDER — LISINOPRIL 10 MG PO TABS
10.0000 mg | ORAL_TABLET | Freq: Every day | ORAL | 3 refills | Status: DC
Start: 2023-02-21 — End: 2023-04-25
  Filled 2023-02-21: qty 90, 90d supply, fill #0

## 2023-02-21 NOTE — Patient Instructions (Signed)
Medication Instructions:   START Metoprolol Succinate - Take one tablet ( 25mg ) by mouth daily.  DECREASE Lisinopril - Take half tablet ( 10mg ) by mouth daily.   *If you need a refill on your cardiac medications before your next appointment, please call your pharmacy*   Lab Work:  None Ordered  If you have labs (blood work) drawn today and your tests are completely normal, you will receive your results only by: MyChart Message (if you have MyChart) OR A paper copy in the mail If you have any lab test that is abnormal or we need to change your treatment, we will call you to review the results.   Testing/Procedures:  None Ordered   Follow-Up: At [facility], you and your health needs are our priority.  As part of our continuing mission to provide you with exceptional heart care, we have created designated Provider Care Teams.  These Care Teams include your primary Cardiologist (physician) and Advanced Practice Providers (APPs -  Physician Assistants and Nurse Practitioners) who all work together to provide you with the care you need, when you need it.  We recommend signing up for the patient portal called "MyChart".  Sign up information is provided on this After Visit Summary.  MyChart is used to connect with patients for Virtual Visits (Telemedicine).  Patients are able to view lab/test results, encounter notes, upcoming appointments, etc.  Non-urgent messages can be sent to your provider as well.   To learn more about what you can do with MyChart, go to [email or web address].    Your next appointment:   3 month(s)  Provider:   You may see Debbe Odea, MD or one of the following Advanced Practice Providers on your designated Care Team:   Nicolasa Ducking, NP Eula Listen, PA-C Cadence Fransico Michael, PA-C Charlsie Quest, NP Carlos Levering, NP

## 2023-02-21 NOTE — Progress Notes (Signed)
Cardiology Office Note:    Date:  02/21/2023   ID:  Sylvia [name], DOB 05-18-1955, MRN [id number]  PCP:  [facility]   [facility] Providers Cardiologist:  Debbe Odea, MD     Referring MD: [facility]   Chief Complaint  Patient presents with   Establish Care    Establish care Tachycardia, medication reviewed verbally with patient    History of Present Illness:    Sylvia [name] is a 67 y.o. female with a hx of hypertension, former smoker of 25+ years, COPD, PAD s/p left AKA, right BKA on Eliquis who presents due to elevated heart rate.  Patient works frequently with physical therapy at home, heart rates have been noted to be elevated at rest up to 116 bpm.  Denies palpitation.  Current smoker.  Blood pressure adequately controlled on lisinopril 20 mg daily.  Started on Eliquis per vascular surgery after leg amputation.  Echo 07/2019 EF 60 to 65%  Past Medical History:  Diagnosis Date   Anemia of chronic disease 05/16/2019   Anxiety    h/o   Arthritis    Chronic kidney disease    Cocaine abuse (HCC)    COPD (chronic obstructive pulmonary disease) (HCC)    Coronary artery disease    Depression    Diabetes mellitus without complication (HCC)    DKA (diabetic ketoacidosis) (HCC)    Elevated troponin    GERD (gastroesophageal reflux disease)    GI bleed    Hypertension    bp under control-off meds since 2019   Ischemic leg     Past Surgical History:  Procedure Laterality Date   ABDOMINAL HYSTERECTOMY     AMPUTATION Right 12/27/2018   Procedure: AMPUTATION BELOW KNEE;  Surgeon: Annice Needy, MD;  Location: [facility];  Service: General;  Laterality: Right;   AMPUTATION Left 11/05/2021   Procedure: AMPUTATION BELOW KNEE;  Surgeon: Annice Needy, MD;  Location: [facility];  Service: Vascular;  Laterality: Left;   AMPUTATION Left 11/04/2022   Procedure: AMPUTATION ABOVE KNEE;  Surgeon: Annice Needy, MD;  Location:  [facility];  Service: Vascular;  Laterality: Left;   APPLICATION OF WOUND VAC  10/16/2021   Procedure: APPLICATION OF WOUND VAC;  Surgeon: Annice Needy, MD;  Location: [facility];  Service: Vascular;;   CATARACT EXTRACTION W/PHACO Right 03/27/2020   Procedure: CATARACT EXTRACTION PHACO AND INTRAOCULAR LENS PLACEMENT (IOC) RIGHT DIABETIC 7.54 00:52.5;  Surgeon: Galen Manila, MD;  Location: [facility];  Service: Ophthalmology;  Laterality: Right;   CATARACT EXTRACTION W/PHACO Left 05/20/2020   Procedure: CATARACT EXTRACTION PHACO AND INTRAOCULAR LENS PLACEMENT (IOC) LEFT DIABETIC 4.95 00:37.6;  Surgeon: Galen Manila, MD;  Location: [facility];  Service: Ophthalmology;  Laterality: Left;  Diabetic - oral meds COVID + 04-24-20   CHOLECYSTECTOMY     COLONOSCOPY WITH PROPOFOL N/A 04/16/2019   Procedure: COLONOSCOPY WITH PROPOFOL;  Surgeon: Pasty Spillers, MD;  Location: [facility];  Service: Endoscopy;  Laterality: N/A;   COLONOSCOPY WITH PROPOFOL N/A 01/16/2020   Procedure: COLONOSCOPY WITH PROPOFOL;  Surgeon: Pasty Spillers, MD;  Location: [facility];  Service: Endoscopy;  Laterality: N/A;   ENDARTERECTOMY FEMORAL Left 10/16/2021   Procedure: ENDARTERECTOMY FEMORAL;  Surgeon: Annice Needy, MD;  Location: [facility];  Service: Vascular;  Laterality: Left;   ESOPHAGOGASTRODUODENOSCOPY (EGD) WITH PROPOFOL N/A 06/15/2018   Procedure: ESOPHAGOGASTRODUODENOSCOPY (EGD) WITH PROPOFOL;  Surgeon: Toledo, Boykin Nearing, MD;  Location: [facility];  Service: Gastroenterology;  Laterality: N/A;   ESOPHAGOGASTRODUODENOSCOPY (EGD) WITH PROPOFOL N/A 01/03/2019   Procedure: ESOPHAGOGASTRODUODENOSCOPY (EGD) WITH PROPOFOL;  Surgeon: Midge Minium, MD;  Location: [facility];  Service: Endoscopy;  Laterality: N/A;   ESOPHAGOGASTRODUODENOSCOPY (EGD) WITH PROPOFOL N/A 09/25/2021   Procedure: ESOPHAGOGASTRODUODENOSCOPY (EGD) WITH PROPOFOL;  Surgeon: Midge Minium, MD;  Location: [facility];  Service: Endoscopy;  Laterality: N/A;   EYE SURGERY Bilateral    GIVENS CAPSULE STUDY  04/16/2019   Procedure: GIVENS CAPSULE STUDY;  Surgeon: Pasty Spillers, MD;  Location: [facility];  Service: Endoscopy;;   GIVENS CAPSULE STUDY N/A 06/25/2019   Procedure: GIVENS CAPSULE STUDY;  Surgeon: Wyline Mood, MD;  Location: [facility];  Service: Gastroenterology;  Laterality: N/A;   INCISION AND DRAINAGE OF WOUND Left 02/24/2022   Procedure: LEFT BKA IRRIGATION AND DEBRIDEMENT WOUND;  Surgeon: Annice Needy, MD;  Location: [facility];  Service: Vascular;  Laterality: Left;   LOWER EXTREMITY ANGIOGRAPHY Right 08/21/2018   Procedure: LOWER EXTREMITY ANGIOGRAPHY;  Surgeon: Annice Needy, MD;  Location: [facility];  Service: Cardiovascular;  Laterality: Right;   LOWER EXTREMITY ANGIOGRAPHY Left 08/28/2018   Procedure: LOWER EXTREMITY ANGIOGRAPHY;  Surgeon: Annice Needy, MD;  Location: [facility];  Service: Cardiovascular;  Laterality: Left;   LOWER EXTREMITY ANGIOGRAPHY Right 08/28/2018   Procedure: Lower Extremity Angiography;  Surgeon: Annice Needy, MD;  Location: [facility];  Service: Cardiovascular;  Laterality: Right;   LOWER EXTREMITY ANGIOGRAPHY Right 12/18/2018   Procedure: Lower Extremity Angiography;  Surgeon: Annice Needy, MD;  Location: [facility];  Service: Cardiovascular;  Laterality: Right;   LOWER EXTREMITY ANGIOGRAPHY Right 12/18/2018   Procedure: Lower Extremity Angiography;  Surgeon: Annice Needy, MD;  Location: [facility];  Service: Cardiovascular;  Laterality: Right;   LOWER EXTREMITY ANGIOGRAPHY Left 12/21/2018   Procedure: Lower Extremity Angiography;  Surgeon: Annice Needy, MD;  Location: [facility];  Service: Cardiovascular;  Laterality: Left;   LOWER EXTREMITY ANGIOGRAPHY Right 12/21/2018   Procedure: Lower Extremity Angiography;  Surgeon: Annice Needy, MD;  Location: [facility];  Service:  Cardiovascular;  Laterality: Right;   LOWER EXTREMITY ANGIOGRAPHY Left 12/25/2020   Procedure: LOWER EXTREMITY ANGIOGRAPHY;  Surgeon: Annice Needy, MD;  Location: [facility];  Service: Cardiovascular;  Laterality: Left;   LOWER EXTREMITY ANGIOGRAPHY Left 09/21/2021   Procedure: Lower Extremity Angiography;  Surgeon: Annice Needy, MD;  Location: [facility];  Service: Cardiovascular;  Laterality: Left;   LOWER EXTREMITY ANGIOGRAPHY Left 10/13/2021   Procedure: Lower Extremity Angiography;  Surgeon: Renford Dills, MD;  Location: [facility];  Service: Cardiovascular;  Laterality: Left;   LOWER EXTREMITY ANGIOGRAPHY Left 10/14/2021   Procedure: Lower Extremity Angiography;  Surgeon: Annice Needy, MD;  Location: [facility];  Service: Cardiovascular;  Laterality: Left;   LOWER EXTREMITY ANGIOGRAPHY Left 11/02/2021   Procedure: Lower Extremity Angiography;  Surgeon: Annice Needy, MD;  Location: [facility];  Service: Cardiovascular;  Laterality: Left;   LOWER EXTREMITY INTERVENTION N/A 12/22/2018   Procedure: LOWER EXTREMITY INTERVENTION;  Surgeon: Annice Needy, MD;  Location: [facility];  Service: Cardiovascular;  Laterality: N/A;   LOWER EXTREMITY INTERVENTION Left 12/26/2020   Procedure: LOWER EXTREMITY INTERVENTION;  Surgeon: Annice Needy, MD;  Location: [facility];  Service: Cardiovascular;  Laterality: Left;   LOWER EXTREMITY INTERVENTION  N/A 09/22/2021   Procedure: LOWER EXTREMITY INTERVENTION;  Surgeon: Annice Needy, MD;  Location: [facility];  Service: Cardiovascular;  Laterality: N/A;    Current Medications: Current Meds  Medication Sig   acetaminophen (TYLENOL) 500 MG tablet Take 500 mg by mouth every 6 (six) hours as needed.   albuterol (PROVENTIL HFA;VENTOLIN HFA) 108 (90 Base) MCG/ACT inhaler Inhale 2 puffs into the lungs every 6 (six) hours as needed for wheezing or shortness of breath.   apixaban  (ELIQUIS) 5 MG TABS tablet Take 1 tablet (5 mg total) by mouth 2 (two) times daily.   atorvastatin (LIPITOR) 10 MG tablet Take 10 mg by mouth at bedtime.   atorvastatin (LIPITOR) 80 MG tablet Take 80 mg by mouth daily.   cetirizine (ZYRTEC) 10 MG tablet Take 10 mg by mouth daily as needed for allergies.   cyclobenzaprine (FLEXERIL) 10 MG tablet Take 1 tablet (10 mg total) by mouth 3 (three) times daily as needed for muscle spasms. Take 1/2 a tablet as needed for pain   DULoxetine (CYMBALTA) 60 MG capsule Take 1 capsule (60 mg total) by mouth daily.   ferrous sulfate 325 (65 FE) MG tablet Take 325 mg by mouth daily.    folic acid (FOLVITE) 1 MG tablet Take 1 mg by mouth daily.   gabapentin (NEURONTIN) 300 MG capsule TAKE 3 CAPSULES BY MOUTH IN THE  MORNING AND 3 CAPSULES BY MOUTH  IN THE EVENING   hydroxychloroquine (PLAQUENIL) 200 MG tablet Take 200 mg by mouth daily.   lisinopril (ZESTRIL) 10 MG tablet Take 1 tablet (10 mg total) by mouth daily.   methotrexate (RHEUMATREX) 2.5 MG tablet Take 2.5 mg by mouth once a week.   metoprolol succinate (TOPROL XL) 25 MG 24 hr tablet Take 1 tablet (25 mg total) by mouth daily.   Multiple Vitamins-Minerals (WOMENS MULTIVITAMIN PO) Take 1 tablet by mouth daily.   oxyCODONE (ROXICODONE) 15 MG immediate release tablet Take 1 tablet (15 mg total) by mouth every 6 (six) hours as needed for pain.   pantoprazole (PROTONIX) 40 MG tablet Take 1 tablet (40 mg total) by mouth daily.   sitaGLIPtin (JANUVIA) 100 MG tablet Take 100 mg by mouth every morning.   VICTOZA 18 MG/3ML SOPN Inject 0.6 mg into the skin daily.   vitamin B-12 (CYANOCOBALAMIN) 500 MCG tablet Take 1 tablet (500 mcg total) by mouth daily.   VOLTAREN ARTHRITIS PAIN 1 % GEL Apply 2 g topically 4 (four) times daily as needed.   [DISCONTINUED] lisinopril (ZESTRIL) 20 MG tablet Take 20 mg by mouth daily.     Allergies:   Vancomycin, Hydrocodone, Metformin and related, Penicillins, and Tramadol    Social History   Socioeconomic History   Marital status: Divorced    Spouse name: Not on file   Number of children: 2   Years of education: Not on file   Highest education level: Not on file  Occupational History   Not on file  Tobacco Use   Smoking status: Former    Current packs/day: 0.25    Average packs/day: 0.3 packs/day for 45.0 years (11.3 ttl pk-yrs)    Types: Cigarettes   Smokeless tobacco: Never   Tobacco comments:    Half a pack a day   Vaping Use   Vaping status: Never Used  Substance and Sexual Activity   Alcohol use: No   Drug use: Not Currently    Types: Cocaine    Comment: last used in April  2021 per patient-last 2 drug tests have been negative for cocaine   Sexual activity: Not Currently  Other Topics Concern   Not on file  Social History Narrative   ** Merged History Encounter **       5 grandchildren and 5 great grandchildren  2 grandchildren live with Pt. (25 & 34 y.o.)   Social Determinants of Health   Financial Resource Strain: Low Risk  (06/14/2018)   Overall Financial Resource Strain (CARDIA)    Difficulty of Paying Living Expenses: Not hard at all  Food Insecurity: No Food Insecurity (11/04/2022)   Hunger Vital Sign    Worried About Running Out of Food in the Last Year: Never true    Ran Out of Food in the Last Year: Never true  Transportation Needs: No Transportation Needs (11/04/2022)   PRAPARE - Administrator, Civil Service (Medical): No    Lack of Transportation (Non-Medical): No  Physical Activity: Insufficiently Active (06/14/2018)   Exercise Vital Sign    Days of Exercise per Week: 4 days    Minutes of Exercise per Session: 20 min  Stress: No Stress Concern Present (06/14/2018)   [employer] of Occupational Health - Occupational Stress Questionnaire    Feeling of Stress : Only a little  Social Connections: Unknown (06/14/2018)   Social Connection and Isolation Panel [NHANES]    Frequency of Communication with  Friends and Family: Patient declined    Frequency of Social Gatherings with Friends and Family: Patient declined    Attends Religious Services: Patient declined    Database administrator or Organizations: Patient declined    Attends Engineer, structural: Patient declined    Marital Status: Patient declined     Family History: The patient's family history includes Breast cancer (age of onset: 52) in her mother; Heart attack in her mother.  ROS:   Please see the history of present illness.     All other systems reviewed and are negative.  EKGs/Labs/Other Studies Reviewed:    The following studies were reviewed today:  EKG Interpretation Date/Time:  Monday February 21 2023 11:59:54 EST Ventricular Rate:  100 PR Interval:  136 QRS Duration:  80 QT Interval:  390 QTC Calculation: 503 R Axis:   35  Text Interpretation: Normal sinus rhythm Nonspecific T wave abnormality Prolonged QT Confirmed by Debbe Odea ([id number]) on 02/21/2023 12:04:19 PM    Recent Labs: 11/07/2022: ALT 10; BUN 18; Creatinine, Ser 0.96; Potassium 3.5; Sodium 138 12/10/2022: Hemoglobin 10.1; Platelet Count 303  Recent Lipid Panel    Component Value Date/Time   CHOL 201 (H) 07/26/2019 0412   TRIG 145 07/26/2019 0412   HDL 40 (L) 07/26/2019 0412   CHOLHDL 5.0 07/26/2019 0412   VLDL 29 07/26/2019 0412   LDLCALC 132 (H) 07/26/2019 0412     Risk Assessment/Calculations:             Physical Exam:    VS:  BP 130/60 (BP Location: Right Arm, Patient Position: Sitting, Cuff Size: Normal)   Pulse 100   Ht 5\' 7"  (1.702 m)   Wt 160 lb (72.6 kg)   SpO2 97%   BMI 25.06 kg/m     Wt Readings from Last 3 Encounters:  02/21/23 160 lb (72.6 kg)  02/08/23 124 lb (56.2 kg)  12/15/22 124 lb 12.8 oz (56.6 kg)     GEN:  Well nourished, well developed in no acute distress HEENT: Normal NECK: No JVD; No carotid bruits CARDIAC:  RRR, no murmurs, rubs, gallops RESPIRATORY: Diminished breath sounds, no  wheezing ABDOMEN: Soft, non-tender, non-distended MUSCULOSKELETAL: left AKA, right BKA noted SKIN: Warm and dry NEUROLOGIC:  Alert and oriented x 3 PSYCHIATRIC:  Normal affect   ASSESSMENT:    1. Tachycardia   2. Primary hypertension   3. Smoking    PLAN:    In order of problems listed above:  Elevated heart rates, smoking likely contributing.  Start Toprol-XL 25 mg daily for both BP and heart rate. Hypertension, BP controlled.  Reduce lisinopril to 20 mg daily (states taking 40mg  daily) with starting Toprol-XL. Current smoker, smoking cessation advised.  Follow-up in 3 months      Medication Adjustments/Labs and Tests Ordered: Current medicines are reviewed at length with the patient today.  Concerns regarding medicines are outlined above.  Orders Placed This Encounter  Procedures   EKG 12-Lead   Meds ordered this encounter  Medications   lisinopril (ZESTRIL) 10 MG tablet    Sig: Take 1 tablet (10 mg total) by mouth daily.    Dispense:  90 tablet    Refill:  3    PLEASE HOLD UNTIL PATIENT CALLS FOR REFILL   metoprolol succinate (TOPROL XL) 25 MG 24 hr tablet    Sig: Take 1 tablet (25 mg total) by mouth daily.    Dispense:  90 tablet    Refill:  3    Patient Instructions  Medication Instructions:   START Metoprolol Succinate - Take one tablet ( 25mg ) by mouth daily.  DECREASE Lisinopril - Take half tablet ( 10mg ) by mouth daily.   *If you need a refill on your cardiac medications before your next appointment, please call your pharmacy*   Lab Work:  None Ordered  If you have labs (blood work) drawn today and your tests are completely normal, you will receive your results only by: MyChart Message (if you have MyChart) OR A paper copy in the mail If you have any lab test that is abnormal or we need to change your treatment, we will call you to review the results.   Testing/Procedures:  None Ordered   Follow-Up: At [facility], you and your  health needs are our priority.  As part of our continuing mission to provide you with exceptional heart care, we have created designated Provider Care Teams.  These Care Teams include your primary Cardiologist (physician) and Advanced Practice Providers (APPs -  Physician Assistants and Nurse Practitioners) who all work together to provide you with the care you need, when you need it.  We recommend signing up for the patient portal called "MyChart".  Sign up information is provided on this After Visit Summary.  MyChart is used to connect with patients for Virtual Visits (Telemedicine).  Patients are able to view lab/test results, encounter notes, upcoming appointments, etc.  Non-urgent messages can be sent to your provider as well.   To learn more about what you can do with MyChart, go to [email or web address].    Your next appointment:   3 month(s)  Provider:   You may see Debbe Odea, MD or one of the following Advanced Practice Providers on your designated Care Team:   Nicolasa Ducking, NP Eula Listen, PA-C Cadence Fransico Michael, PA-C Charlsie Quest, NP Carlos Levering, NP   Signed, Debbe Odea, MD  02/21/2023 12:52 PM    [facility]

## 2023-03-01 ENCOUNTER — Encounter (INDEPENDENT_AMBULATORY_CARE_PROVIDER_SITE_OTHER): Payer: Self-pay | Admitting: Vascular Surgery

## 2023-03-01 ENCOUNTER — Ambulatory Visit (INDEPENDENT_AMBULATORY_CARE_PROVIDER_SITE_OTHER): Payer: 59 | Admitting: Vascular Surgery

## 2023-03-01 VITALS — BP 162/78 | HR 99 | Resp 16

## 2023-03-01 DIAGNOSIS — I70229 Atherosclerosis of native arteries of extremities with rest pain, unspecified extremity: Secondary | ICD-10-CM

## 2023-03-01 DIAGNOSIS — E1142 Type 2 diabetes mellitus with diabetic polyneuropathy: Secondary | ICD-10-CM

## 2023-03-01 DIAGNOSIS — I10 Essential (primary) hypertension: Secondary | ICD-10-CM

## 2023-03-01 DIAGNOSIS — S78119A Complete traumatic amputation at level between unspecified hip and knee, initial encounter: Secondary | ICD-10-CM | POA: Diagnosis not present

## 2023-03-01 NOTE — Assessment & Plan Note (Signed)
Healed.  In the process of getting her prosthetic evaluation.

## 2023-03-01 NOTE — Progress Notes (Signed)
MRN : [id number]  Sylvia [name] is a 67 y.o. ([birth year]) female who presents with chief complaint of  Chief Complaint  Patient presents with   Follow-up    Follow up  .  History of Present Illness: Patient returns today in follow up of her PAD and amputations.  She still having a lot of left hip pain.  She is taking muscle relaxers which helped some but make her feel quite loopy.  We previously put an orthopedic referral, but she has not yet been contacted by their office. 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  She has been seen by the [facility] and her evaluation is ongoing.  Current Outpatient Medications  Medication Sig Dispense Refill   acetaminophen (TYLENOL) 500 MG tablet Take 500 mg by mouth every 6 (six) hours as needed.     albuterol (PROVENTIL HFA;VENTOLIN HFA) 108 (90 Base) MCG/ACT inhaler Inhale 2 puffs into the lungs every 6 (six) hours as needed for wheezing or shortness of breath. 1 Inhaler 2   apixaban (ELIQUIS) 5 MG TABS tablet Take 1 tablet (5 mg total) by mouth 2 (two) times daily. 60 tablet 12   atorvastatin (LIPITOR) 10 MG tablet Take 10 mg by mouth at bedtime.     atorvastatin (LIPITOR) 80 MG tablet Take 80 mg by mouth daily.     cetirizine (ZYRTEC) 10 MG tablet Take 10 mg by mouth daily as needed for allergies.     cyclobenzaprine (FLEXERIL) 10 MG tablet Take 1 tablet (10 mg total) by mouth 3 (three) times daily as needed for muscle spasms. Take 1/2 a tablet as needed for pain 90 tablet 1   DULoxetine (CYMBALTA) 60 MG capsule Take 1 capsule (60 mg total) by mouth daily. 30 capsule 3   ferrous sulfate 325 (65 FE) MG tablet Take 325 mg by mouth daily.      folic acid (FOLVITE) 1 MG tablet Take 1 mg by mouth daily.     gabapentin (NEURONTIN) 300 MG capsule TAKE 3 CAPSULES BY MOUTH IN THE  MORNING AND 3 CAPSULES BY MOUTH  IN THE EVENING 450 capsule 3   hydroxychloroquine (PLAQUENIL) 200 MG tablet Take 200 mg by mouth daily.     lisinopril  (ZESTRIL) 10 MG tablet Take 1 tablet (10 mg total) by mouth daily. 90 tablet 3   methotrexate (RHEUMATREX) 2.5 MG tablet Take 2.5 mg by mouth once a week.     metoprolol succinate (TOPROL XL) 25 MG 24 hr tablet Take 1 tablet (25 mg total) by mouth daily. 90 tablet 3   Multiple Vitamins-Minerals (WOMENS MULTIVITAMIN PO) Take 1 tablet by mouth daily.     oxyCODONE (ROXICODONE) 15 MG immediate release tablet Take 1 tablet (15 mg total) by mouth every 6 (six) hours as needed for pain. 30 tablet 0   pantoprazole (PROTONIX) 40 MG tablet Take 1 tablet (40 mg total) by mouth daily. 30 tablet 11   sitaGLIPtin (JANUVIA) 100 MG tablet Take 100 mg by mouth every morning.     VICTOZA 18 MG/3ML SOPN Inject 0.6 mg into the skin daily.     vitamin B-12 (CYANOCOBALAMIN) 500 MCG tablet Take 1 tablet (500 mcg total) by mouth daily. 90 tablet 1   VOLTAREN ARTHRITIS PAIN 1 % GEL Apply 2 g topically 4 (four) times daily as needed.     No current facility-administered medications for this visit.    Past Medical History:  Diagnosis Date   Anemia of chronic  disease 05/16/2019   Anxiety    h/o   Arthritis    Chronic kidney disease    Cocaine abuse (HCC)    COPD (chronic obstructive pulmonary disease) (HCC)    Coronary artery disease    Depression    Diabetes mellitus without complication (HCC)    DKA (diabetic ketoacidosis) (HCC)    Elevated troponin    GERD (gastroesophageal reflux disease)    GI bleed    Hypertension    bp under control-off meds since 2019   Ischemic leg     Past Surgical History:  Procedure Laterality Date   ABDOMINAL HYSTERECTOMY     AMPUTATION Right 12/27/2018   Procedure: AMPUTATION BELOW KNEE;  Surgeon: Annice Needy, MD;  Location: [facility];  Service: General;  Laterality: Right;   AMPUTATION Left 11/05/2021   Procedure: AMPUTATION BELOW KNEE;  Surgeon: Annice Needy, MD;  Location: [facility];  Service: Vascular;  Laterality: Left;   AMPUTATION Left 11/04/2022   Procedure:  AMPUTATION ABOVE KNEE;  Surgeon: Annice Needy, MD;  Location: [facility];  Service: Vascular;  Laterality: Left;   APPLICATION OF WOUND VAC  10/16/2021   Procedure: APPLICATION OF WOUND VAC;  Surgeon: Annice Needy, MD;  Location: [facility];  Service: Vascular;;   CATARACT EXTRACTION W/PHACO Right 03/27/2020   Procedure: CATARACT EXTRACTION PHACO AND INTRAOCULAR LENS PLACEMENT (IOC) RIGHT DIABETIC 7.54 00:52.5;  Surgeon: Galen Manila, MD;  Location: [facility];  Service: Ophthalmology;  Laterality: Right;   CATARACT EXTRACTION W/PHACO Left 05/20/2020   Procedure: CATARACT EXTRACTION PHACO AND INTRAOCULAR LENS PLACEMENT (IOC) LEFT DIABETIC 4.95 00:37.6;  Surgeon: Galen Manila, MD;  Location: [facility];  Service: Ophthalmology;  Laterality: Left;  Diabetic - oral meds COVID + 04-24-20   CHOLECYSTECTOMY     COLONOSCOPY WITH PROPOFOL N/A 04/16/2019   Procedure: COLONOSCOPY WITH PROPOFOL;  Surgeon: Pasty Spillers, MD;  Location: [facility];  Service: Endoscopy;  Laterality: N/A;   COLONOSCOPY WITH PROPOFOL N/A 01/16/2020   Procedure: COLONOSCOPY WITH PROPOFOL;  Surgeon: Pasty Spillers, MD;  Location: [facility];  Service: Endoscopy;  Laterality: N/A;   ENDARTERECTOMY FEMORAL Left 10/16/2021   Procedure: ENDARTERECTOMY FEMORAL;  Surgeon: Annice Needy, MD;  Location: [facility];  Service: Vascular;  Laterality: Left;   ESOPHAGOGASTRODUODENOSCOPY (EGD) WITH PROPOFOL N/A 06/15/2018   Procedure: ESOPHAGOGASTRODUODENOSCOPY (EGD) WITH PROPOFOL;  Surgeon: Toledo, Boykin Nearing, MD;  Location: [facility];  Service: Gastroenterology;  Laterality: N/A;   ESOPHAGOGASTRODUODENOSCOPY (EGD) WITH PROPOFOL N/A 01/03/2019   Procedure: ESOPHAGOGASTRODUODENOSCOPY (EGD) WITH PROPOFOL;  Surgeon: Midge Minium, MD;  Location: [facility];  Service: Endoscopy;  Laterality: N/A;   ESOPHAGOGASTRODUODENOSCOPY (EGD) WITH PROPOFOL N/A 09/25/2021   Procedure: ESOPHAGOGASTRODUODENOSCOPY  (EGD) WITH PROPOFOL;  Surgeon: Midge Minium, MD;  Location: [facility];  Service: Endoscopy;  Laterality: N/A;   EYE SURGERY Bilateral    GIVENS CAPSULE STUDY  04/16/2019   Procedure: GIVENS CAPSULE STUDY;  Surgeon: Pasty Spillers, MD;  Location: [facility];  Service: Endoscopy;;   GIVENS CAPSULE STUDY N/A 06/25/2019   Procedure: GIVENS CAPSULE STUDY;  Surgeon: Wyline Mood, MD;  Location[facility];  Service: Gastroenterology;  Laterality: N/A;   INCISION AND DRAINAGE OF WOUND Left 02/24/2022   Procedure: LEFT BKA IRRIGATION AND DEBRIDEMENT WOUND;  Surgeon: Annice Needy, MD;  Location: [facility];  Service: Vascular;  Laterality: Left;   LOWER EXTREMITY ANGIOGRAPHY Right 08/21/2018   Procedure: LOWER EXTREMITY ANGIOGRAPHY;  Surgeon: Annice Needy, MD;  Location:  [facility];  Service: Cardiovascular;  Laterality: Right;   LOWER EXTREMITY ANGIOGRAPHY Left 08/28/2018   Procedure: LOWER EXTREMITY ANGIOGRAPHY;  Surgeon: Annice Needy, MD;  Location: [facility];  Service: Cardiovascular;  Laterality: Left;   LOWER EXTREMITY ANGIOGRAPHY Right 08/28/2018   Procedure: Lower Extremity Angiography;  Surgeon: Annice Needy, MD;  Location: [facility];  Service: Cardiovascular;  Laterality: Right;   LOWER EXTREMITY ANGIOGRAPHY Right 12/18/2018   Procedure: Lower Extremity Angiography;  Surgeon: Annice Needy, MD;  Location: [facility];  Service: Cardiovascular;  Laterality: Right;   LOWER EXTREMITY ANGIOGRAPHY Right 12/18/2018   Procedure: Lower Extremity Angiography;  Surgeon: Annice Needy, MD;  Location: [facility];  Service: Cardiovascular;  Laterality: Right;   LOWER EXTREMITY ANGIOGRAPHY Left 12/21/2018   Procedure: Lower Extremity Angiography;  Surgeon: Annice Needy, MD;  Location: [facility];  Service: Cardiovascular;  Laterality: Left;   LOWER EXTREMITY ANGIOGRAPHY Right 12/21/2018   Procedure: Lower Extremity Angiography;   Surgeon: Annice Needy, MD;  Location: [facility];  Service: Cardiovascular;  Laterality: Right;   LOWER EXTREMITY ANGIOGRAPHY Left 12/25/2020   Procedure: LOWER EXTREMITY ANGIOGRAPHY;  Surgeon: Annice Needy, MD;  Location: [facility];  Service: Cardiovascular;  Laterality: Left;   LOWER EXTREMITY ANGIOGRAPHY Left 09/21/2021   Procedure: Lower Extremity Angiography;  Surgeon: Annice Needy, MD;  Location: [facility];  Service: Cardiovascular;  Laterality: Left;   LOWER EXTREMITY ANGIOGRAPHY Left 10/13/2021   Procedure: Lower Extremity Angiography;  Surgeon: Renford Dills, MD;  Location: [facility];  Service: Cardiovascular;  Laterality: Left;   LOWER EXTREMITY ANGIOGRAPHY Left 10/14/2021   Procedure: Lower Extremity Angiography;  Surgeon: Annice Needy, MD;  Location: [facility];  Service: Cardiovascular;  Laterality: Left;   LOWER EXTREMITY ANGIOGRAPHY Left 11/02/2021   Procedure: Lower Extremity Angiography;  Surgeon: Annice Needy, MD;  Location: [facility];  Service: Cardiovascular;  Laterality: Left;   LOWER EXTREMITY INTERVENTION N/A 12/22/2018   Procedure: LOWER EXTREMITY INTERVENTION;  Surgeon: Annice Needy, MD;  Location: [facility];  Service: Cardiovascular;  Laterality: N/A;   LOWER EXTREMITY INTERVENTION Left 12/26/2020   Procedure: LOWER EXTREMITY INTERVENTION;  Surgeon: Annice Needy, MD;  Location: [facility];  Service: Cardiovascular;  Laterality: Left;   LOWER EXTREMITY INTERVENTION N/A 09/22/2021   Procedure: LOWER EXTREMITY INTERVENTION;  Surgeon: Annice Needy, MD;  Location: [facility];  Service: Cardiovascular;  Laterality: N/A;     Social History   Tobacco Use   Smoking status: Former    Current packs/day: 0.25    Average packs/day: 0.3 packs/day for 45.0 years (11.3 ttl pk-yrs)    Types: Cigarettes   Smokeless tobacco: Never   Tobacco comments:    Half a pack a day   Vaping  Use   Vaping status: Never Used  Substance Use Topics   Alcohol use: No   Drug use: Not Currently    Types: Cocaine    Comment: last used in April 2021 per patient-last 2 drug tests have been negative for cocaine      Family History  Problem Relation Age of Onset   Heart attack Mother    Breast cancer Mother 42    Allergies  Allergen Reactions   Vancomycin Rash    Patient developed a rash to injection site and arm a  few mintes after starting ABX.    Hydrocodone Rash    Patient tolerates hydromorphone   Metformin And Related Diarrhea   Penicillins Hives   Tramadol Itching     REVIEW OF SYSTEMS (Negative unless checked)   Constitutional: [] Weight loss  [] Fever  [] Chills Cardiac: [] Chest pain   [] Chest pressure   [] Palpitations   [] Shortness of breath when laying flat   [] Shortness of breath at rest   [x] Shortness of breath with exertion. Vascular:  [] Pain in legs with walking   [] Pain in legs at rest   [] Pain in legs when laying flat   [] Claudication   [] Pain in feet when walking  [] Pain in feet at rest  [] Pain in feet when laying flat   [] History of DVT   [] Phlebitis   [] Swelling in legs   [] Varicose veins   [] Non-healing ulcers Pulmonary:   [] Uses home oxygen   [] Productive cough   [] Hemoptysis   [] Wheeze  [x] COPD   [] Asthma Neurologic:  [] Dizziness  [] Blackouts   [] Seizures   [] History of stroke   [] History of TIA  [] Aphasia   [] Temporary blindness   [] Dysphagia   [] Weakness or numbness in arms   [] Weakness or numbness in legs Musculoskeletal:  [x] Arthritis   [] Joint swelling   [] Joint pain   [] Low back pain Hematologic:  [] Easy bruising  [] Easy bleeding   [] Hypercoagulable state   [] Anemic   Gastrointestinal:  [] Blood in stool   [] Vomiting blood  [x] Gastroesophageal reflux/heartburn   [] Abdominal pain Genitourinary:  [] Chronic kidney disease   [] Difficult urination  [] Frequent urination  [] Burning with urination   [] Hematuria Skin:  [] Rashes   [x] Ulcers    [x] Wounds Psychological:  [] History of anxiety   []  History of major depression.    Physical Examination  BP (!) 162/78 (BP Location: Right Arm)   Pulse 99   Resp 16  Gen:  WD/WN, NAD Head: [place]/AT, No temporalis wasting. Ear/Nose/Throat: Hearing grossly intact, nares w/o erythema or drainage Eyes: Conjunctiva clear. Sclera non-icteric Neck: Supple.  Trachea midline Pulmonary:  Good air movement, no use of accessory muscles.  Cardiac: RRR, no JVD Vascular:  Vessel Right Left  Radial Palpable Palpable                                   Gastrointestinal: soft, non-tender/non-distended. No guarding/reflex.  Musculoskeletal: M/S 5/5 throughout.  Right BKA and Left AKA healed. Neurologic: Sensation grossly intact in extremities.  Symmetrical.  Speech is fluent.  Psychiatric: Judgment intact, Mood & affect appropriate for pt's clinical situation. Dermatologic: No rashes or ulcers noted.  No cellulitis or open wounds.      Labs Recent Results (from the past 2160 hour(s))  Retic Panel     Status: Abnormal   Collection Time: 12/10/22 11:49 AM  Result Value Ref Range   Retic Ct Pct 1.4 0.4 - 3.1 %   RBC. 3.61 (L) 3.87 - 5.11 MIL/uL   Retic Count, Absolute 51.6 19.0 - 186.0 K/uL   Immature Retic Fract 9.0 2.3 - 15.9 %   Reticulocyte Hemoglobin 32.6 >27.9 pg    Comment:        Given the high negative predictive value of a RET-He result > 32 pg iron deficiency is essentially excluded. If this patient is anemic other etiologies should be considered. Performed at [facility], [address], [place], [place] [address]   Ferritin     Status: None  Collection Time: 12/10/22 11:49 AM  Result Value Ref Range   Ferritin 95 11 - 307 ng/mL    Comment: Performed at [facility], [address] [place], [place] [address]  Iron and TIBC     Status: None   Collection Time: 12/10/22 11:49 AM  Result Value Ref Range   Iron 91 28 - 170 ug/dL   TIBC 621 308 - 657  ug/dL   Saturation Ratios 29 10.4 - 31.8 %   UIBC 227 ug/dL    Comment: Performed at [facility], [address] [place], [place] [address]  CBC with Differential (Cancer Center Only)     Status: Abnormal   Collection Time: 12/10/22 11:49 AM  Result Value Ref Range   WBC Count 8.2 4.0 - 10.5 K/uL   RBC 3.58 (L) 3.87 - 5.11 MIL/uL   Hemoglobin 10.1 (L) 12.0 - 15.0 g/dL   HCT 29.5 (L) 28.4 - 13.2 %   MCV 83.2 80.0 - 100.0 fL   MCH 28.2 26.0 - 34.0 pg   MCHC 33.9 30.0 - 36.0 g/dL   RDW 44.0 10.2 - 72.5 %   Platelet Count 303 150 - 400 K/uL   nRBC 0.0 0.0 - 0.2 %   Neutrophils Relative % 64 %   Neutro Abs 5.2 1.7 - 7.7 K/uL   Lymphocytes Relative 24 %   Lymphs Abs 2.0 0.7 - 4.0 K/uL   Monocytes Relative 6 %   Monocytes Absolute 0.5 0.1 - 1.0 K/uL   Eosinophils Relative 4 %   Eosinophils Absolute 0.3 0.0 - 0.5 K/uL   Basophils Relative 1 %   Basophils Absolute 0.1 0.0 - 0.1 K/uL   Immature Granulocytes 1 %   Abs Immature Granulocytes 0.10 (H) 0.00 - 0.07 K/uL    Comment: Performed at [facility], [address], [place], [place] [address]    Radiology No results found.  Assessment/Plan Essential hypertension blood pressure control important in reducing the progression of atherosclerotic disease. On appropriate oral medications.     Type 2 diabetes mellitus with peripheral neuropathy (HCC) blood glucose control important in reducing the progression of atherosclerotic disease. Also, involved in wound healing. On appropriate medications.  Likely contributing to neuropathic pain in the lower extremities.   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  Wounds are healed.  Working on Teaching laboratory technician.  Still having a lot of left hip pain and we had previously put in an orthopedic referral and we will try to contact them to get her in.  Amputation above knee (HCC) Healed.  In the process of getting  her prosthetic evaluation.    [name], MD  03/01/2023 2:42 PM    This note was created with Dragon medical transcription system.  Any errors from dictation are purely unintentional

## 2023-03-01 NOTE — Assessment & Plan Note (Signed)
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  Wounds are healed.  Working on Teaching laboratory technician.  Still having a lot of left hip pain and we had previously put in an orthopedic referral and we will try to contact them to get her in.

## 2023-04-18 ENCOUNTER — Inpatient Hospital Stay: Payer: 59 | Attending: Oncology

## 2023-04-18 DIAGNOSIS — D638 Anemia in other chronic diseases classified elsewhere: Secondary | ICD-10-CM

## 2023-04-18 DIAGNOSIS — D631 Anemia in chronic kidney disease: Secondary | ICD-10-CM | POA: Insufficient documentation

## 2023-04-18 DIAGNOSIS — N189 Chronic kidney disease, unspecified: Secondary | ICD-10-CM | POA: Diagnosis present

## 2023-04-18 LAB — CBC WITH DIFFERENTIAL (CANCER CENTER ONLY)
Abs Immature Granulocytes: 0.05 10*3/uL (ref 0.00–0.07)
Basophils Absolute: 0.1 10*3/uL (ref 0.0–0.1)
Basophils Relative: 1 %
Eosinophils Absolute: 0.3 10*3/uL (ref 0.0–0.5)
Eosinophils Relative: 3 %
HCT: 28.5 % — ABNORMAL LOW (ref 36.0–46.0)
Hemoglobin: 9.8 g/dL — ABNORMAL LOW (ref 12.0–15.0)
Immature Granulocytes: 1 %
Lymphocytes Relative: 30 %
Lymphs Abs: 2.9 10*3/uL (ref 0.7–4.0)
MCH: 29.3 pg (ref 26.0–34.0)
MCHC: 34.4 g/dL (ref 30.0–36.0)
MCV: 85.3 fL (ref 80.0–100.0)
Monocytes Absolute: 0.8 10*3/uL (ref 0.1–1.0)
Monocytes Relative: 8 %
Neutro Abs: 5.6 10*3/uL (ref 1.7–7.7)
Neutrophils Relative %: 57 %
Platelet Count: 322 10*3/uL (ref 150–400)
RBC: 3.34 MIL/uL — ABNORMAL LOW (ref 3.87–5.11)
RDW: 13.2 % (ref 11.5–15.5)
WBC Count: 9.7 10*3/uL (ref 4.0–10.5)
nRBC: 0 % (ref 0.0–0.2)

## 2023-04-18 LAB — IRON AND TIBC
Iron: 99 ug/dL (ref 28–170)
Saturation Ratios: 36 % — ABNORMAL HIGH (ref 10.4–31.8)
TIBC: 273 ug/dL (ref 250–450)
UIBC: 174 ug/dL

## 2023-04-18 LAB — FERRITIN: Ferritin: 227 ng/mL (ref 11–307)

## 2023-04-19 ENCOUNTER — Ambulatory Visit: Payer: 59 | Admitting: Oncology

## 2023-04-19 ENCOUNTER — Ambulatory Visit: Payer: 59

## 2023-04-25 ENCOUNTER — Inpatient Hospital Stay: Payer: 59

## 2023-04-25 ENCOUNTER — Inpatient Hospital Stay: Payer: 59 | Attending: Oncology | Admitting: Oncology

## 2023-04-25 ENCOUNTER — Encounter: Payer: Self-pay | Admitting: Oncology

## 2023-04-25 VITALS — BP 153/81 | Temp 97.2°F | Resp 18

## 2023-04-25 DIAGNOSIS — M069 Rheumatoid arthritis, unspecified: Secondary | ICD-10-CM | POA: Diagnosis not present

## 2023-04-25 DIAGNOSIS — N189 Chronic kidney disease, unspecified: Secondary | ICD-10-CM | POA: Diagnosis not present

## 2023-04-25 DIAGNOSIS — D638 Anemia in other chronic diseases classified elsewhere: Secondary | ICD-10-CM | POA: Diagnosis not present

## 2023-04-25 DIAGNOSIS — D631 Anemia in chronic kidney disease: Secondary | ICD-10-CM | POA: Insufficient documentation

## 2023-04-25 NOTE — Progress Notes (Signed)
 Hematology/Oncology Progress note [phone] [phone]     REFERRING PROVIDER: [facility] CHIEF COMPLAINTS/REASON FOR VISIT:  Follow up of anemia  ASSESSMENT & PLAN:   Anemia of chronic disease Anemia of chronic disease - RA and CKD, marrow suppression from methotrexate Labs are reviewed and discussed with patient.   Lab Results  Component Value Date   HGB 9.8 (L) 04/18/2023   TIBC 273 04/18/2023   IRONPCTSAT 36 (H) 04/18/2023   FERRITIN 227 04/18/2023    Hemoglobin is close to 10, no iron  deficiency.  Recommend observation.  Suggest patient to discuss with rheumatology to see if methotrexate dosage could be reduced.      Orders Placed This Encounter  Procedures   CBC with Differential (Cancer Center Only)    Standing Status:   Future    Expected Date:   08/23/2023    Expiration Date:   04/24/2024   Iron  and TIBC    Standing Status:   Future    Expected Date:   08/23/2023    Expiration Date:   04/24/2024   Ferritin    Standing Status:   Future    Expected Date:   08/23/2023    Expiration Date:   04/24/2024   Retic Panel    Standing Status:   Future    Expected Date:   08/23/2023    Expiration Date:   04/24/2024   Follow-up in 4 months All questions were answered. The patient knows to call the clinic with any problems, questions or concerns.  Zelphia Cap, MD, PhD [facility] Hematology Oncology 04/25/2023    HISTORY OF PRESENTING ILLNESS:   Reviewed patient's recent labs  05/09/2019, hemoglobin 8.1, MCV 88, platelet 470. 04/16/2019, iron  panel showed saturation 18, ferritin 28, TIBC 308 Patient has been started on oral iron  supplementation NiFerrex 150 mg daily.  She reports tolerating well.  Reviewed patient's previous labs ordered by primary care physician's office, anemia is chronic onset , duration is since at least July 2017.  Patient's hemoglobin was about 9 back in September 2020.  She was admitted at that time due to severe PAD  and right foot gangrene.  Status post right below-knee amputation.   She was admitted in December due to acute drop of hemoglobin to 6.  She has a history of GI bleed secondary to gastric ulcer.  Received PRBC transfusion.  Patient is on Eliquis  2.5 mg twice daily. No aggravating or improving factors.  Last endoscopy: 04/13/2019 EGD colonoscopy and small bowel capsule study during her hospitalization.  EGD was negative, 04/16/2019 colonoscopy with small polyps removed, 04/18/2019 small bowel capsule study did not reach this small bowel and was inconclusive.   History of recurrent acute GI bleeding/melena, history of PUD on chronic anticoagulation. 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    RA, follows up with rheumatology Dr.Patel. On MTX with folic acid  and plaquenil .   11/05/2021-11/09/2021, patient was hospitalized due to peripheral artery disease, status post left below-knee amputation. Patient is  on Eliquis  5 mg twice daily as well as aspirin  81 mg daily.  INTERVAL HISTORY Sylvia [name] is a 68 y.o. female who has above history reviewed by me today presents for follow up visit for management of anemia Patient reports feeling okay.  Chronic fatigue unchanged. She has no new complaints.  Review of Systems  Constitutional:  Positive for fatigue. Negative for appetite change, chills, fever and unexpected weight change.  HENT:   Negative for hearing loss and voice change.  Eyes:  Negative for eye problems.  Respiratory:  Negative for chest tightness and cough.   Cardiovascular:  Negative for chest pain.  Gastrointestinal:  Negative for abdominal distention, abdominal pain and blood in stool.  Endocrine: Negative for hot flashes.  Genitourinary:  Negative for difficulty urinating and frequency.   Musculoskeletal:  Negative for arthralgias.  Skin:  Negative for itching and rash.  Neurological:  Negative for extremity weakness.  Hematological:  Negative for adenopathy.   Psychiatric/Behavioral:  Negative for confusion.     MEDICAL HISTORY:  Past Medical History:  Diagnosis Date   Anemia of chronic disease 05/16/2019   Anxiety    h/o   Arthritis    Chronic kidney disease    Cocaine abuse (HCC)    COPD (chronic obstructive pulmonary disease) (HCC)    Coronary artery disease    Depression    Diabetes mellitus without complication (HCC)    DKA (diabetic ketoacidosis) (HCC)    Elevated troponin    GERD (gastroesophageal reflux disease)    GI bleed    Hypertension    bp under control-off meds since 2019   Ischemic leg     SURGICAL HISTORY: Past Surgical History:  Procedure Laterality Date   ABDOMINAL HYSTERECTOMY     AMPUTATION Right 12/27/2018   Procedure: AMPUTATION BELOW KNEE;  Surgeon: Marea Selinda RAMAN, MD;  Location: [facility];  Service: General;  Laterality: Right;   AMPUTATION Left 11/05/2021   Procedure: AMPUTATION BELOW KNEE;  Surgeon: Marea Selinda RAMAN, MD;  Location: [facility];  Service: Vascular;  Laterality: Left;   AMPUTATION Left 11/04/2022   Procedure: AMPUTATION ABOVE KNEE;  Surgeon: Marea Selinda RAMAN, MD;  Location: [facility];  Service: Vascular;  Laterality: Left;   APPLICATION OF WOUND VAC  10/16/2021   Procedure: APPLICATION OF WOUND VAC;  Surgeon: Marea Selinda RAMAN, MD;  Location: [facility];  Service: Vascular;;   CATARACT EXTRACTION W/PHACO Right 03/27/2020   Procedure: CATARACT EXTRACTION PHACO AND INTRAOCULAR LENS PLACEMENT (IOC) RIGHT DIABETIC 7.54 00:52.5;  Surgeon: Jaye Fallow, MD;  Location: [facility];  Service: Ophthalmology;  Laterality: Right;   CATARACT EXTRACTION W/PHACO Left 05/20/2020   Procedure: CATARACT EXTRACTION PHACO AND INTRAOCULAR LENS PLACEMENT (IOC) LEFT DIABETIC 4.95 00:37.6;  Surgeon: Jaye Fallow, MD;  Location: [facility];  Service: Ophthalmology;  Laterality: Left;  Diabetic - oral meds COVID + 04-24-20   CHOLECYSTECTOMY     COLONOSCOPY WITH PROPOFOL  N/A 04/16/2019   Procedure: COLONOSCOPY  WITH PROPOFOL ;  Surgeon: Janalyn Keene NOVAK, MD;  Location: [facility];  Service: Endoscopy;  Laterality: N/A;   COLONOSCOPY WITH PROPOFOL  N/A 01/16/2020   Procedure: COLONOSCOPY WITH PROPOFOL ;  Surgeon: Janalyn Keene NOVAK, MD;  Location: [facility];  Service: Endoscopy;  Laterality: N/A;   ENDARTERECTOMY FEMORAL Left 10/16/2021   Procedure: ENDARTERECTOMY FEMORAL;  Surgeon: Marea Selinda RAMAN, MD;  Location: [facility];  Service: Vascular;  Laterality: Left;   ESOPHAGOGASTRODUODENOSCOPY (EGD) WITH PROPOFOL  N/A 06/15/2018   Procedure: ESOPHAGOGASTRODUODENOSCOPY (EGD) WITH PROPOFOL ;  Surgeon: Toledo, Ladell POUR, MD;  Location: [facility];  Service: Gastroenterology;  Laterality: N/A;   ESOPHAGOGASTRODUODENOSCOPY (EGD) WITH PROPOFOL  N/A 01/03/2019   Procedure: ESOPHAGOGASTRODUODENOSCOPY (EGD) WITH PROPOFOL ;  Surgeon: Jinny Carmine, MD;  Location: [facility];  Service: Endoscopy;  Laterality: N/A;   ESOPHAGOGASTRODUODENOSCOPY (EGD) WITH PROPOFOL  N/A 09/25/2021   Procedure: ESOPHAGOGASTRODUODENOSCOPY (EGD) WITH PROPOFOL ;  Surgeon: Jinny Carmine, MD;  Location: [facility];  Service: Endoscopy;  Laterality: N/A;   EYE SURGERY Bilateral    GIVENS CAPSULE  STUDY  04/16/2019   Procedure: GIVENS CAPSULE STUDY;  Surgeon: Janalyn Keene NOVAK, MD;  Location: [facility];  Service: Endoscopy;;   GIVENS CAPSULE STUDY N/A 06/25/2019   Procedure: GIVENS CAPSULE STUDY;  Surgeon: Therisa Bi, MD;  Location: [facility];  Service: Gastroenterology;  Laterality: N/A;   INCISION AND DRAINAGE OF WOUND Left 02/24/2022   Procedure: LEFT BKA IRRIGATION AND DEBRIDEMENT WOUND;  Surgeon: Marea Selinda RAMAN, MD;  Location: [facility];  Service: Vascular;  Laterality: Left;   LOWER EXTREMITY ANGIOGRAPHY Right 08/21/2018   Procedure: LOWER EXTREMITY ANGIOGRAPHY;  Surgeon: Marea Selinda RAMAN, MD;  Location: [facility];  Service: Cardiovascular;  Laterality: Right;   LOWER EXTREMITY ANGIOGRAPHY Left 08/28/2018    Procedure: LOWER EXTREMITY ANGIOGRAPHY;  Surgeon: Marea Selinda RAMAN, MD;  Location: [facility];  Service: Cardiovascular;  Laterality: Left;   LOWER EXTREMITY ANGIOGRAPHY Right 08/28/2018   Procedure: Lower Extremity Angiography;  Surgeon: Marea Selinda RAMAN, MD;  Location: [facility];  Service: Cardiovascular;  Laterality: Right;   LOWER EXTREMITY ANGIOGRAPHY Right 12/18/2018   Procedure: Lower Extremity Angiography;  Surgeon: Marea Selinda RAMAN, MD;  Location: [facility];  Service: Cardiovascular;  Laterality: Right;   LOWER EXTREMITY ANGIOGRAPHY Right 12/18/2018   Procedure: Lower Extremity Angiography;  Surgeon: Marea Selinda RAMAN, MD;  Location: [facility];  Service: Cardiovascular;  Laterality: Right;   LOWER EXTREMITY ANGIOGRAPHY Left 12/21/2018   Procedure: Lower Extremity Angiography;  Surgeon: Marea Selinda RAMAN, MD;  Location: [facility];  Service: Cardiovascular;  Laterality: Left;   LOWER EXTREMITY ANGIOGRAPHY Right 12/21/2018   Procedure: Lower Extremity Angiography;  Surgeon: Marea Selinda RAMAN, MD;  Location: [facility];  Service: Cardiovascular;  Laterality: Right;   LOWER EXTREMITY ANGIOGRAPHY Left 12/25/2020   Procedure: LOWER EXTREMITY ANGIOGRAPHY;  Surgeon: Marea Selinda RAMAN, MD;  Location: [facility];  Service: Cardiovascular;  Laterality: Left;   LOWER EXTREMITY ANGIOGRAPHY Left 09/21/2021   Procedure: Lower Extremity Angiography;  Surgeon: Marea Selinda RAMAN, MD;  Location: [facility];  Service: Cardiovascular;  Laterality: Left;   LOWER EXTREMITY ANGIOGRAPHY Left 10/13/2021   Procedure: Lower Extremity Angiography;  Surgeon: Jama Cordella MATSU, MD;  Location: [facility];  Service: Cardiovascular;  Laterality: Left;   LOWER EXTREMITY ANGIOGRAPHY Left 10/14/2021   Procedure: Lower Extremity Angiography;  Surgeon: Marea Selinda RAMAN, MD;  Location: [facility];  Service: Cardiovascular;  Laterality: Left;   LOWER EXTREMITY  ANGIOGRAPHY Left 11/02/2021   Procedure: Lower Extremity Angiography;  Surgeon: Marea Selinda RAMAN, MD;  Location: [facility];  Service: Cardiovascular;  Laterality: Left;   LOWER EXTREMITY INTERVENTION N/A 12/22/2018   Procedure: LOWER EXTREMITY INTERVENTION;  Surgeon: Marea Selinda RAMAN, MD;  Location: [facility];  Service: Cardiovascular;  Laterality: N/A;   LOWER EXTREMITY INTERVENTION Left 12/26/2020   Procedure: LOWER EXTREMITY INTERVENTION;  Surgeon: Marea Selinda RAMAN, MD;  Location: [facility];  Service: Cardiovascular;  Laterality: Left;   LOWER EXTREMITY INTERVENTION N/A 09/22/2021   Procedure: LOWER EXTREMITY INTERVENTION;  Surgeon: Marea Selinda RAMAN, MD;  Location: [facility];  Service: Cardiovascular;  Laterality: N/A;    SOCIAL HISTORY: Social History   Socioeconomic History   Marital status: Divorced    Spouse name: Not on file   Number of children: 2   Years of education: Not on file   Highest education level: Not on file  Occupational History  Not on file  Tobacco Use   Smoking status: Former    Current packs/day: 0.25    Average packs/day: 0.3 packs/day for 45.0 years (11.3 ttl pk-yrs)    Types: Cigarettes   Smokeless tobacco: Never   Tobacco comments:    Half a pack a day   Vaping Use   Vaping status: Never Used  Substance and Sexual Activity   Alcohol use: No   Drug use: Not Currently    Types: Cocaine    Comment: last used in April 2021 per patient-last 2 drug tests have been negative for cocaine   Sexual activity: Not Currently  Other Topics Concern   Not on file  Social History Narrative   ** Merged History Encounter **       5 grandchildren and 5 great grandchildren  2 grandchildren live with Pt. (25 & 77 y.o.)   Social Drivers of Corporate Investment Banker Strain: Low Risk  (06/14/2018)   Overall Financial Resource Strain (CARDIA)    Difficulty of Paying Living Expenses: Not hard at all  Food Insecurity: No Food Insecurity  (11/04/2022)   Hunger Vital Sign    Worried About Running Out of Food in the Last Year: Never true    Ran Out of Food in the Last Year: Never true  Transportation Needs: No Transportation Needs (11/04/2022)   PRAPARE - Administrator, Civil Service (Medical): No    Lack of Transportation (Non-Medical): No  Physical Activity: Insufficiently Active (06/14/2018)   Exercise Vital Sign    Days of Exercise per Week: 4 days    Minutes of Exercise per Session: 20 min  Stress: No Stress Concern Present (06/14/2018)   [employer] of Occupational Health - Occupational Stress Questionnaire    Feeling of Stress : Only a little  Social Connections: Unknown (06/14/2018)   Social Connection and Isolation Panel [NHANES]    Frequency of Communication with Friends and Family: Patient declined    Frequency of Social Gatherings with Friends and Family: Patient declined    Attends Religious Services: Patient declined    Database Administrator or Organizations: Patient declined    Attends Banker Meetings: Patient declined    Marital Status: Patient declined  Intimate Partner Violence: Not At Risk (11/04/2022)   Humiliation, Afraid, Rape, and Kick questionnaire    Fear of Current or Ex-Partner: No    Emotionally Abused: No    Physically Abused: No    Sexually Abused: No    FAMILY HISTORY: Family History  Problem Relation Age of Onset   Heart attack Mother    Breast cancer Mother 90    ALLERGIES:  is allergic to vancomycin , hydrocodone , metformin and related, penicillins, and tramadol .  MEDICATIONS:  Current Outpatient Medications  Medication Sig Dispense Refill   acetaminophen  (TYLENOL ) 500 MG tablet Take 500 mg by mouth every 6 (six) hours as needed.     albuterol  (PROVENTIL  HFA;VENTOLIN  HFA) 108 (90 Base) MCG/ACT inhaler Inhale 2 puffs into the lungs every 6 (six) hours as needed for wheezing or shortness of breath. 1 Inhaler 2   apixaban  (ELIQUIS ) 5 MG TABS tablet  Take 1 tablet (5 mg total) by mouth 2 (two) times daily. 60 tablet 12   atorvastatin  (LIPITOR) 80 MG tablet Take 80 mg by mouth daily.     cetirizine (ZYRTEC) 10 MG tablet Take 10 mg by mouth daily as needed for allergies.     cyclobenzaprine  (FLEXERIL ) 10 MG tablet Take 1  tablet (10 mg total) by mouth 3 (three) times daily as needed for muscle spasms. Take 1/2 a tablet as needed for pain 90 tablet 1   DULoxetine  (CYMBALTA ) 60 MG capsule Take 1 capsule (60 mg total) by mouth daily. 30 capsule 3   ferrous sulfate  325 (65 FE) MG tablet Take 325 mg by mouth daily.      folic acid  (FOLVITE ) 1 MG tablet Take 1 mg by mouth daily.     gabapentin  (NEURONTIN ) 300 MG capsule TAKE 3 CAPSULES BY MOUTH IN THE  MORNING AND 3 CAPSULES BY MOUTH  IN THE EVENING 450 capsule 3   hydroxychloroquine  (PLAQUENIL ) 200 MG tablet Take 200 mg by mouth daily.     JARDIANCE  25 MG TABS tablet Take 25 mg by mouth daily.     lisinopril  (ZESTRIL ) 40 MG tablet Take 40 mg by mouth daily.     methotrexate (RHEUMATREX) 2.5 MG tablet Take 2.5 mg by mouth once a week.     metoprolol  succinate (TOPROL  XL) 25 MG 24 hr tablet Take 1 tablet (25 mg total) by mouth daily. 90 tablet 3   Multiple Vitamins-Minerals (WOMENS MULTIVITAMIN PO) Take 1 tablet by mouth daily.     oxyCODONE  (ROXICODONE ) 15 MG immediate release tablet Take 1 tablet (15 mg total) by mouth every 6 (six) hours as needed for pain. 30 tablet 0   pantoprazole  (PROTONIX ) 40 MG tablet Take 1 tablet (40 mg total) by mouth daily. 30 tablet 11   VICTOZA  18 MG/3ML SOPN Inject 0.6 mg into the skin daily.     vitamin B-12 (CYANOCOBALAMIN ) 500 MCG tablet Take 1 tablet (500 mcg total) by mouth daily. 90 tablet 1   VOLTAREN ARTHRITIS PAIN 1 % GEL Apply 2 g topically 4 (four) times daily as needed.     No current facility-administered medications for this visit.     PHYSICAL EXAMINATION: ECOG PERFORMANCE STATUS: 1 - Symptomatic but completely ambulatory Vitals:   04/25/23 1339   BP: (!) 153/81  Resp: 18  Temp: (!) 97.2 F (36.2 C)   Filed Weights    Physical Exam Constitutional:      General: She is not in acute distress.    Comments: Patient walks with a cane  HENT:     Head: Normocephalic and atraumatic.  Eyes:     General: No scleral icterus. Cardiovascular:     Rate and Rhythm: Normal rate.  Pulmonary:     Effort: Pulmonary effort is normal. No respiratory distress.     Breath sounds: No wheezing.  Abdominal:     General: Bowel sounds are normal. There is no distension.     Palpations: Abdomen is soft.  Musculoskeletal:        General: No deformity. Normal range of motion.     Cervical back: Normal range of motion and neck supple.     Comments: History of right lower extremity below-knee amputation Status post left below-knee amputation, wound covered with dressing  Skin:    General: Skin is warm and dry.     Findings: No rash.  Neurological:     Mental Status: She is alert and oriented to person, place, and time. Mental status is at baseline.  Psychiatric:        Mood and Affect: Mood normal.      LABORATORY DATA:  I have reviewed the data as listed    Latest Ref Rng & Units 04/18/2023   11:36 AM 12/10/2022   11:49 AM 11/07/2022  4:44 AM  CBC  WBC 4.0 - 10.5 K/uL 9.7  8.2  10.5   Hemoglobin 12.0 - 15.0 g/dL 9.8  89.8  8.3   Hematocrit 36.0 - 46.0 % 28.5  29.8  24.2   Platelets 150 - 400 K/uL 322  303  288       Latest Ref Rng & Units 11/07/2022    4:44 AM 11/06/2022    4:39 AM 11/05/2022    9:03 AM  CMP  Glucose 70 - 99 mg/dL 862  875  882   BUN 8 - 23 mg/dL 18  13  14    Creatinine 0.44 - 1.00 mg/dL 9.03  9.06  9.11   Sodium 135 - 145 mmol/L 138  140  141   Potassium 3.5 - 5.1 mmol/L 3.5  3.6  3.7   Chloride 98 - 111 mmol/L 110  112  112   CO2 22 - 32 mmol/L 23  23  21    Calcium  8.9 - 10.3 mg/dL 8.8  8.7  8.9   Total Protein 6.5 - 8.1 g/dL 6.4     Total Bilirubin 0.3 - 1.2 mg/dL 0.7     Alkaline Phos 38 - 126 U/L 80      AST 15 - 41 U/L 12     ALT 0 - 44 U/L 10        Iron /TIBC/Ferritin/ %Sat    Component Value Date/Time   IRON  99 04/18/2023 1136   TIBC 273 04/18/2023 1136   FERRITIN 227 04/18/2023 1136   IRONPCTSAT 36 (H) 04/18/2023 1136

## 2023-04-25 NOTE — Assessment & Plan Note (Addendum)
 Anemia of chronic disease - RA and CKD, marrow suppression from methotrexate Labs are reviewed and discussed with patient.   Lab Results  Component Value Date   HGB 9.8 (L) 04/18/2023   TIBC 273 04/18/2023   IRONPCTSAT 36 (H) 04/18/2023   FERRITIN 227 04/18/2023    Hemoglobin is close to 10, no iron  deficiency.  Recommend observation.  Suggest patient to discuss with rheumatology to see if methotrexate dosage could be reduced.

## 2023-05-03 ENCOUNTER — Ambulatory Visit (INDEPENDENT_AMBULATORY_CARE_PROVIDER_SITE_OTHER): Payer: 59 | Admitting: Vascular Surgery

## 2023-05-03 ENCOUNTER — Encounter: Payer: Self-pay | Admitting: Oncology

## 2023-05-17 ENCOUNTER — Encounter: Payer: Self-pay | Admitting: Oncology

## 2023-05-21 ENCOUNTER — Encounter: Payer: Self-pay | Admitting: Oncology

## 2023-05-23 ENCOUNTER — Encounter: Payer: Self-pay | Admitting: Oncology

## 2023-05-24 ENCOUNTER — Encounter: Payer: Self-pay | Admitting: Oncology

## 2023-05-25 ENCOUNTER — Encounter: Payer: Self-pay | Admitting: Cardiology

## 2023-05-25 ENCOUNTER — Ambulatory Visit: Payer: 59 | Attending: Cardiology | Admitting: Cardiology

## 2023-05-25 VITALS — BP 156/72 | HR 100

## 2023-05-25 DIAGNOSIS — I10 Essential (primary) hypertension: Secondary | ICD-10-CM

## 2023-05-25 DIAGNOSIS — R000 Tachycardia, unspecified: Secondary | ICD-10-CM

## 2023-05-25 DIAGNOSIS — F17200 Nicotine dependence, unspecified, uncomplicated: Secondary | ICD-10-CM | POA: Diagnosis not present

## 2023-05-25 MED ORDER — NICOTINE 21 MG/24HR TD PT24: 21.0000 mg | MEDICATED_PATCH | Freq: Every day | TRANSDERMAL | 1 refills | Status: AC

## 2023-05-25 MED ORDER — METOPROLOL SUCCINATE ER 50 MG PO TB24: ORAL_TABLET | ORAL | 3 refills | Status: DC
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
## 2023-05-25 NOTE — Patient Instructions (Addendum)
 Medication Instructions:  - Your physician has recommended you make the following change in your medication:   1) INCREASE Toprol  (metoprolol  succinate) to 50 mg - take 1 tablet by mouth once daily   2) START Nicotine  Patches: Place 1 patch onto the skin daily.  21 mg patch x 6 weeks, then 14 mg patch x 2 weeks, then 7 mg patch x 2 weeks, then stop  *If you need a refill on your cardiac medications before your next appointment, please call your pharmacy*   Lab Work: - none ordered  If you have labs (blood work) drawn today and your tests are completely normal, you will receive your results only by: MyChart Message (if you have MyChart) OR A paper copy in the mail If you have any lab test that is abnormal or we need to change your treatment, we will call you to review the results.   Testing/Procedures: - none ordered   Follow-Up: At [facility], you and your health needs are our priority.  As part of our continuing mission to provide you with exceptional heart care, we have created designated Provider Care Teams.  These Care Teams include your primary Cardiologist (physician) and Advanced Practice Providers (APPs -  Physician Assistants and Nurse Practitioners) who all work together to provide you with the care you need, when you need it.  We recommend signing up for the patient portal called MyChart.  Sign up information is provided on this After Visit Summary.  MyChart is used to connect with patients for Virtual Visits (Telemedicine).  Patients are able to view lab/test results, encounter notes, upcoming appointments, etc.  Non-urgent messages can be sent to your provider as well.   To learn more about what you can do with MyChart, go to [email or web address].    Your next appointment:   4 month(s)  Provider:   Redell Cave, MD    Other Instructions  Nicotine  Patches What is this medication? NICOTINE  (NIK oh teen) helps you quit smoking. It reduces  cravings for nicotine , the addictive substance found in tobacco. It may also help reduce symptoms of withdrawal. It is most effective when used in combination with a stop-smoking program. This medicine may be used for other purposes; ask your health care provider or pharmacist if you have questions. COMMON BRAND NAME(S): Habitrol , Nicoderm CQ , Nicotrol  What should I tell my care team before I take this medication? They need to know if you have any of these conditions: Diabetes Heart disease, angina, irregular heartbeat, or previous heart attack High blood pressure High thyroid  levels Lung or breathing disease, such as asthma Pheochromocytoma Seizures or a history of seizures Skin problems, such as eczema Stomach ulcers, other stomach or intestine problems An unusual or allergic reaction to nicotine , adhesives, other medications, foods, dyes, or preservatives Pregnant or trying to get pregnant Breastfeeding How should I use this medication? This medication is for external use only. Follow the directions on the label. Do not cut or trim the patch. After applying the patch, wash your hands. Change the patch every day in the morning, keeping to a regular schedule. When you apply a new patch, use a new area of skin. Wait at least 1 week before using the same area again. This medication comes with INSTRUCTIONS FOR USE. Ask your pharmacist for directions on how to use this medication. Read the information carefully. Talk to your pharmacist or care team if you have questions. Talk to your care team about the use of this medication  in children. Special care may be needed. Overdosage: If you think you have taken too much of this medicine contact a poison control center or emergency room at once. NOTE: This medicine is only for you. Do not share this medicine with others. What if I miss a dose? If you forget to replace a patch, use it as soon as you can. Only use one patch at a time and do not leave on  the skin for longer than directed. If a patch falls off, you can replace it, but keep to your schedule and remove the patch at the right time. What may interact with this medication? Certain medications for lung or breathing disease, such as asthma Medications for blood pressure Medications for depression This list may not describe all possible interactions. Give your health care provider a list of all the medicines, herbs, non-prescription drugs, or dietary supplements you use. Also tell them if you smoke, drink alcohol, or use illegal drugs. Some items may interact with your medicine. What should I watch for while using this medication? Visit your care team for regular checks on your progress. Talk to your care team about what you can do to improve your chances of quitting. If you are going to need surgery, an MRI, CT scan, or other procedure, tell your care team that you are using this medication. You may need to remove the patch before the procedure. What side effects may I notice from receiving this medication? Side effects that you should report to your care team as soon as possible: Allergic reactions--skin rash, itching, hives, swelling of the face, lips, tongue, or throat Heart palpitations--rapid, pounding, or irregular heartbeat Increase in blood pressure Side effects that usually do not require medical attention (report to your care team if they continue or are bothersome): Dizziness Headache Heartburn Hiccups Irritation at application site Trouble sleeping This list may not describe all possible side effects. Call your doctor for medical advice about side effects. You may report side effects to FDA at 1-800-FDA-1088. Where should I keep my medication? This product may have enough nicotine  in it to make children and pets sick. Keep it away from children and pets. After using, throw away as directed on the package. Store at room temperature between 20 and 25 degrees C (68 and 77  degrees F). Protect from heat and light. Keep this medication in the original container until ready to use. Throw away unused medication after the expiration date. NOTE: This sheet is a summary. It may not cover all possible information. If you have questions about this medicine, talk to your doctor, pharmacist, or health care provider.  2024 Elsevier/Gold Standard (2022-04-05 00:00:00)

## 2023-05-26 ENCOUNTER — Other Ambulatory Visit: Payer: Self-pay | Admitting: *Deleted

## 2023-05-26 MED ORDER — METOPROLOL SUCCINATE ER 50 MG PO TB24: 50.0000 mg | ORAL_TABLET | Freq: Every day | ORAL | 3 refills | Status: AC

## 2023-05-31 ENCOUNTER — Encounter (INDEPENDENT_AMBULATORY_CARE_PROVIDER_SITE_OTHER): Payer: Self-pay | Admitting: Vascular Surgery

## 2023-05-31 ENCOUNTER — Ambulatory Visit (INDEPENDENT_AMBULATORY_CARE_PROVIDER_SITE_OTHER): Payer: 59 | Admitting: Vascular Surgery

## 2023-05-31 VITALS — BP 141/84 | HR 96 | Resp 16

## 2023-05-31 DIAGNOSIS — E1142 Type 2 diabetes mellitus with diabetic polyneuropathy: Secondary | ICD-10-CM | POA: Diagnosis not present

## 2023-05-31 DIAGNOSIS — I10 Essential (primary) hypertension: Secondary | ICD-10-CM

## 2023-05-31 DIAGNOSIS — Z89511 Acquired absence of right leg below knee: Secondary | ICD-10-CM

## 2023-05-31 DIAGNOSIS — I739 Peripheral vascular disease, unspecified: Secondary | ICD-10-CM

## 2023-05-31 DIAGNOSIS — S78119A Complete traumatic amputation at level between unspecified hip and knee, initial encounter: Secondary | ICD-10-CM

## 2023-05-31 DIAGNOSIS — I70229 Atherosclerosis of native arteries of extremities with rest pain, unspecified extremity: Secondary | ICD-10-CM

## 2023-05-31 NOTE — Assessment & Plan Note (Signed)
Now status post amputations on both sides.  Getting her above-knee amputation prosthesis for the left tomorrow.  Already has a right below-knee amputation prosthesis.  Follow-up in 6 months.

## 2023-05-31 NOTE — Progress Notes (Signed)
MRN : [id number]  Sylvia [name] is a 68 y.o. (04-17-1956) female who presents with chief complaint of  Chief Complaint  Patient presents with   Follow-up    2-3 month follow up  .  History of Present Illness: Patient returns today in follow up of PAD.  She is status post right BKA and left AKA at this point.  She is doing pretty well.  She still needs some muscle relaxers from time to time for spasms particularly in the left side.  She mostly takes those at night.  She denies any open wounds or infection.  She has been working with the [facility] and is scheduled to get her left above-knee amputation prosthesis tomorrow.  She is going back to physical therapy and [place] to work with gait training.  She is not good spirits today.  Current Outpatient Medications  Medication Sig Dispense Refill   acetaminophen (TYLENOL) 500 MG tablet Take 500 mg by mouth every 6 (six) hours as needed.     albuterol (PROVENTIL HFA;VENTOLIN HFA) 108 (90 Base) MCG/ACT inhaler Inhale 2 puffs into the lungs every 6 (six) hours as needed for wheezing or shortness of breath. 1 Inhaler 2   apixaban (ELIQUIS) 5 MG TABS tablet Take 1 tablet (5 mg total) by mouth 2 (two) times daily. 60 tablet 12   atorvastatin (LIPITOR) 80 MG tablet Take 80 mg by mouth daily.     cetirizine (ZYRTEC) 10 MG tablet Take 10 mg by mouth daily as needed for allergies.     cyclobenzaprine (FLEXERIL) 10 MG tablet Take 1 tablet (10 mg total) by mouth 3 (three) times daily as needed for muscle spasms. Take 1/2 a tablet as needed for pain 90 tablet 1   DULoxetine (CYMBALTA) 60 MG capsule Take 1 capsule (60 mg total) by mouth daily. 30 capsule 3   ferrous sulfate 325 (65 FE) MG tablet Take 325 mg by mouth daily.      folic acid (FOLVITE) 1 MG tablet Take 1 mg by mouth daily.     gabapentin (NEURONTIN) 300 MG capsule TAKE 3 CAPSULES BY MOUTH IN THE  MORNING AND 3 CAPSULES BY MOUTH  IN THE EVENING 450 capsule 3   hydroxychloroquine  (PLAQUENIL) 200 MG tablet Take 200 mg by mouth daily.     JARDIANCE 25 MG TABS tablet Take 25 mg by mouth daily.     lisinopril (ZESTRIL) 40 MG tablet Take 40 mg by mouth daily.     methotrexate (RHEUMATREX) 2.5 MG tablet Take 2.5 mg by mouth once a week.     metoprolol succinate (TOPROL-XL) 50 MG 24 hr tablet Take 1 tablet (50 mg total) by mouth daily. Take with or immediately following a meal. 90 tablet 3   Multiple Vitamins-Minerals (WOMENS MULTIVITAMIN PO) Take 1 tablet by mouth daily.     nicotine (NICODERM CQ - DOSED IN MG/24 HOURS) 21 mg/24hr patch Place 1 patch (21 mg total) onto the skin daily. 28 patch 1   pantoprazole (PROTONIX) 40 MG tablet Take 1 tablet (40 mg total) by mouth daily. 30 tablet 11   VICTOZA 18 MG/3ML SOPN Inject 0.6 mg into the skin daily.     vitamin B-12 (CYANOCOBALAMIN) 500 MCG tablet Take 1 tablet (500 mcg total) by mouth daily. 90 tablet 1   VOLTAREN ARTHRITIS PAIN 1 % GEL Apply 2 g topically 4 (four) times daily as needed.     oxyCODONE (ROXICODONE) 15 MG immediate release tablet Take 1 tablet (15  mg total) by mouth every 6 (six) hours as needed for pain. (Patient not taking: Reported on 05/25/2023) 30 tablet 0   No current facility-administered medications for this visit.    Past Medical History:  Diagnosis Date   Anemia of chronic disease 05/16/2019   Anxiety    h/o   Arthritis    Chronic kidney disease    Cocaine abuse (HCC)    COPD (chronic obstructive pulmonary disease) (HCC)    Coronary artery disease    Depression    Diabetes mellitus without complication (HCC)    DKA (diabetic ketoacidosis) (HCC)    Elevated troponin    GERD (gastroesophageal reflux disease)    GI bleed    Hypertension    bp under control-off meds since 2019   Ischemic leg     Past Surgical History:  Procedure Laterality Date   ABDOMINAL HYSTERECTOMY     AMPUTATION Right 12/27/2018   Procedure: AMPUTATION BELOW KNEE;  Surgeon: Annice Needy, MD;  Location: [facility];   Service: General;  Laterality: Right;   AMPUTATION Left 11/05/2021   Procedure: AMPUTATION BELOW KNEE;  Surgeon: Annice Needy, MD;  Location: [facility];  Service: Vascular;  Laterality: Left;   AMPUTATION Left 11/04/2022   Procedure: AMPUTATION ABOVE KNEE;  Surgeon: Annice Needy, MD;  Location: [facility];  Service: Vascular;  Laterality: Left;   APPLICATION OF WOUND VAC  10/16/2021   Procedure: APPLICATION OF WOUND VAC;  Surgeon: Annice Needy, MD;  Location: [facility];  Service: Vascular;;   CATARACT EXTRACTION W/PHACO Right 03/27/2020   Procedure: CATARACT EXTRACTION PHACO AND INTRAOCULAR LENS PLACEMENT (IOC) RIGHT DIABETIC 7.54 00:52.5;  Surgeon: Galen Manila, MD;  Location: [facility];  Service: Ophthalmology;  Laterality: Right;   CATARACT EXTRACTION W/PHACO Left 05/20/2020   Procedure: CATARACT EXTRACTION PHACO AND INTRAOCULAR LENS PLACEMENT (IOC) LEFT DIABETIC 4.95 00:37.6;  Surgeon: Galen Manila, MD;  Location: [facility];  Service: Ophthalmology;  Laterality: Left;  Diabetic - oral meds COVID + 04-24-20   CHOLECYSTECTOMY     COLONOSCOPY WITH PROPOFOL N/A 04/16/2019   Procedure: COLONOSCOPY WITH PROPOFOL;  Surgeon: Pasty Spillers, MD;  Location: [facility];  Service: Endoscopy;  Laterality: N/A;   COLONOSCOPY WITH PROPOFOL N/A 01/16/2020   Procedure: COLONOSCOPY WITH PROPOFOL;  Surgeon: Pasty Spillers, MD;  Location: [facility];  Service: Endoscopy;  Laterality: N/A;   ENDARTERECTOMY FEMORAL Left 10/16/2021   Procedure: ENDARTERECTOMY FEMORAL;  Surgeon: Annice Needy, MD;  Location: [facility];  Service: Vascular;  Laterality: Left;   ESOPHAGOGASTRODUODENOSCOPY (EGD) WITH PROPOFOL N/A 06/15/2018   Procedure: ESOPHAGOGASTRODUODENOSCOPY (EGD) WITH PROPOFOL;  Surgeon: Toledo, Boykin Nearing, MD;  Location: [facility];  Service: Gastroenterology;  Laterality: N/A;   ESOPHAGOGASTRODUODENOSCOPY (EGD) WITH PROPOFOL N/A 01/03/2019   Procedure:  ESOPHAGOGASTRODUODENOSCOPY (EGD) WITH PROPOFOL;  Surgeon: Midge Minium, MD;  Location: [facility];  Service: Endoscopy;  Laterality: N/A;   ESOPHAGOGASTRODUODENOSCOPY (EGD) WITH PROPOFOL N/A 09/25/2021   Procedure: ESOPHAGOGASTRODUODENOSCOPY (EGD) WITH PROPOFOL;  Surgeon: Midge Minium, MD;  Location: [facility];  Service: Endoscopy;  Laterality: N/A;   EYE SURGERY Bilateral    GIVENS CAPSULE STUDY  04/16/2019   Procedure: GIVENS CAPSULE STUDY;  Surgeon: Pasty Spillers, MD;  Location: [facility];  Service: Endoscopy;;   GIVENS CAPSULE STUDY N/A 06/25/2019   Procedure: GIVENS CAPSULE STUDY;  Surgeon: Wyline Mood, MD;  Location: [facility];  Service: Gastroenterology;  Laterality: N/A;   INCISION AND DRAINAGE OF WOUND Left 02/24/2022  Procedure: LEFT BKA IRRIGATION AND DEBRIDEMENT WOUND;  Surgeon: Annice Needy, MD;  Location: [facility];  Service: Vascular;  Laterality: Left;   LOWER EXTREMITY ANGIOGRAPHY Right 08/21/2018   Procedure: LOWER EXTREMITY ANGIOGRAPHY;  Surgeon: Annice Needy, MD;  Location: [facility];  Service: Cardiovascular;  Laterality: Right;   LOWER EXTREMITY ANGIOGRAPHY Left 08/28/2018   Procedure: LOWER EXTREMITY ANGIOGRAPHY;  Surgeon: Annice Needy, MD;  Location: [facility];  Service: Cardiovascular;  Laterality: Left;   LOWER EXTREMITY ANGIOGRAPHY Right 08/28/2018   Procedure: Lower Extremity Angiography;  Surgeon: Annice Needy, MD;  Location: [facility];  Service: Cardiovascular;  Laterality: Right;   LOWER EXTREMITY ANGIOGRAPHY Right 12/18/2018   Procedure: Lower Extremity Angiography;  Surgeon: Annice Needy, MD;  Location: [facility];  Service: Cardiovascular;  Laterality: Right;   LOWER EXTREMITY ANGIOGRAPHY Right 12/18/2018   Procedure: Lower Extremity Angiography;  Surgeon: Annice Needy, MD;  Location: [facility];  Service: Cardiovascular;  Laterality: Right;   LOWER EXTREMITY ANGIOGRAPHY Left  12/21/2018   Procedure: Lower Extremity Angiography;  Surgeon: Annice Needy, MD;  Location: [facility];  Service: Cardiovascular;  Laterality: Left;   LOWER EXTREMITY ANGIOGRAPHY Right 12/21/2018   Procedure: Lower Extremity Angiography;  Surgeon: Annice Needy, MD;  Location: [facility];  Service: Cardiovascular;  Laterality: Right;   LOWER EXTREMITY ANGIOGRAPHY Left 12/25/2020   Procedure: LOWER EXTREMITY ANGIOGRAPHY;  Surgeon: Annice Needy, MD;  Location: [facility];  Service: Cardiovascular;  Laterality: Left;   LOWER EXTREMITY ANGIOGRAPHY Left 09/21/2021   Procedure: Lower Extremity Angiography;  Surgeon: Annice Needy, MD;  Location: [facility];  Service: Cardiovascular;  Laterality: Left;   LOWER EXTREMITY ANGIOGRAPHY Left 10/13/2021   Procedure: Lower Extremity Angiography;  Surgeon: Renford Dills, MD;  Location: [facility];  Service: Cardiovascular;  Laterality: Left;   LOWER EXTREMITY ANGIOGRAPHY Left 10/14/2021   Procedure: Lower Extremity Angiography;  Surgeon: Annice Needy, MD;  Location: [facility];  Service: Cardiovascular;  Laterality: Left;   LOWER EXTREMITY ANGIOGRAPHY Left 11/02/2021   Procedure: Lower Extremity Angiography;  Surgeon: Annice Needy, MD;  Location: [facility];  Service: Cardiovascular;  Laterality: Left;   LOWER EXTREMITY INTERVENTION N/A 12/22/2018   Procedure: LOWER EXTREMITY INTERVENTION;  Surgeon: Annice Needy, MD;  Location: [facility];  Service: Cardiovascular;  Laterality: N/A;   LOWER EXTREMITY INTERVENTION Left 12/26/2020   Procedure: LOWER EXTREMITY INTERVENTION;  Surgeon: Annice Needy, MD;  Location: [facility];  Service: Cardiovascular;  Laterality: Left;   LOWER EXTREMITY INTERVENTION N/A 09/22/2021   Procedure: LOWER EXTREMITY INTERVENTION;  Surgeon: Annice Needy, MD;  Location: [facility];  Service: Cardiovascular;  Laterality: N/A;     Social  History   Tobacco Use   Smoking status: Former    Current packs/day: 0.25    Average packs/day: 0.3 packs/day for 45.0 years (11.3 ttl pk-yrs)    Types: Cigarettes   Smokeless tobacco: Never   Tobacco comments:    Half a pack a day   Vaping Use   Vaping status: Never Used  Substance Use Topics   Alcohol use: No   Drug use: Not Currently    Types: Cocaine    Comment: last used in April 2021 per patient-last 2 drug tests have been negative for cocaine  Family History  Problem Relation Age of Onset   Heart attack Mother    Breast cancer Mother 46    Allergies  Allergen Reactions   Vancomycin Rash    Patient developed a rash to injection site and arm a few mintes after starting ABX.    Hydrocodone Rash    Patient tolerates hydromorphone   Metformin And Related Diarrhea   Penicillins Hives   Tramadol Itching     REVIEW OF SYSTEMS (Negative unless checked)   Constitutional: [] Weight loss  [] Fever  [] Chills Cardiac: [] Chest pain   [] Chest pressure   [] Palpitations   [] Shortness of breath when laying flat   [] Shortness of breath at rest   [x] Shortness of breath with exertion. Vascular:  [] Pain in legs with walking   [] Pain in legs at rest   [] Pain in legs when laying flat   [] Claudication   [] Pain in feet when walking  [] Pain in feet at rest  [] Pain in feet when laying flat   [] History of DVT   [] Phlebitis   [] Swelling in legs   [] Varicose veins   [] Non-healing ulcers Pulmonary:   [] Uses home oxygen   [] Productive cough   [] Hemoptysis   [] Wheeze  [x] COPD   [] Asthma Neurologic:  [] Dizziness  [] Blackouts   [] Seizures   [] History of stroke   [] History of TIA  [] Aphasia   [] Temporary blindness   [] Dysphagia   [] Weakness or numbness in arms   [] Weakness or numbness in legs Musculoskeletal:  [x] Arthritis   [] Joint swelling   [] Joint pain   [] Low back pain Hematologic:  [] Easy bruising  [] Easy bleeding   [] Hypercoagulable state   [] Anemic   Gastrointestinal:  [] Blood in stool    [] Vomiting blood  [x] Gastroesophageal reflux/heartburn   [] Abdominal pain Genitourinary:  [] Chronic kidney disease   [] Difficult urination  [] Frequent urination  [] Burning with urination   [] Hematuria Skin:  [] Rashes   [x] Ulcers   [x] Wounds Psychological:  [] History of anxiety   []  History of major depression.  Physical Examination  BP (!) 141/84   Pulse 96   Resp 16  Gen:  WD/WN, NAD Head: [place]/AT, No temporalis wasting. Ear/Nose/Throat: Hearing grossly intact, nares w/o erythema or drainage Eyes: Conjunctiva clear. Sclera non-icteric Neck: Supple.  Trachea midline Pulmonary:  Good air movement, no use of accessory muscles.  Cardiac: RRR, no JVD Vascular:  Vessel Right Left  Radial Palpable Palpable       Musculoskeletal: M/S 5/5 throughout.  No deformity or atrophy.  Right BKA is well-healed.  Left AKA is well-healed.  No edema. Neurologic: Sensation grossly intact in extremities.  Symmetrical.  Speech is fluent.  Psychiatric: Judgment intact, Mood & affect appropriate for pt's clinical situation. Dermatologic: No rashes or ulcers noted.  No cellulitis or open wounds.      Labs Recent Results (from the past 2160 hours)  Ferritin     Status: None   Collection Time: 04/18/23 11:36 AM  Result Value Ref Range   Ferritin 227 11 - 307 ng/mL    Comment: Performed at [facility], [address] [place], [place] [address]  Iron and TIBC     Status: Abnormal   Collection Time: 04/18/23 11:36 AM  Result Value Ref Range   Iron 99 28 - 170 ug/dL   TIBC 191 478 - 295 ug/dL   Saturation Ratios 36 (H) 10.4 - 31.8 %   UIBC 174 ug/dL    Comment: Performed at [facility], [address], [place], [place] [address]  CBC with Differential (Cancer Center Only)     Status: Abnormal   Collection Time: 04/18/23 11:36 AM  Result Value Ref Range   WBC Count 9.7 4.0 - 10.5 K/uL   RBC 3.34 (L) 3.87 - 5.11 MIL/uL   Hemoglobin 9.8 (L) 12.0 - 15.0 g/dL   HCT 47.8 (L) 29.5 -  46.0 %   MCV 85.3 80.0 - 100.0 fL   MCH 29.3 26.0 - 34.0 pg   MCHC 34.4 30.0 - 36.0 g/dL   RDW 62.1 30.8 - 65.7 %   Platelet Count 322 150 - 400 K/uL   nRBC 0.0 0.0 - 0.2 %   Neutrophils Relative % 57 %   Neutro Abs 5.6 1.7 - 7.7 K/uL   Lymphocytes Relative 30 %   Lymphs Abs 2.9 0.7 - 4.0 K/uL   Monocytes Relative 8 %   Monocytes Absolute 0.8 0.1 - 1.0 K/uL   Eosinophils Relative 3 %   Eosinophils Absolute 0.3 0.0 - 0.5 K/uL   Basophils Relative 1 %   Basophils Absolute 0.1 0.0 - 0.1 K/uL   Immature Granulocytes 1 %   Abs Immature Granulocytes 0.05 0.00 - 0.07 K/uL    Comment: Performed at [facility], [address] [place], [place] [address]    Radiology No results found.  Assessment/Plan  Amputation above knee (HCC) Left.  Well-healed.  Scheduled to get her left above-knee amputation prosthesis tomorrow.  Hx of right BKA (HCC) More remote.  Also well-healed.  Already has a right below-knee amputation prosthesis.  PAD (peripheral artery disease) (HCC) Now status post amputations on both sides.  Getting her above-knee amputation prosthesis for the left tomorrow.  Already has a right below-knee amputation prosthesis.  Follow-up in 6 months.  Essential hypertension blood pressure control important in reducing the progression of atherosclerotic disease. On appropriate oral medications.     Type 2 diabetes mellitus with peripheral neuropathy (HCC) blood glucose control important in reducing the progression of atherosclerotic disease. Also, involved in wound healing. On appropriate medications.  Likely contributing to neuropathic pain in the lower extremities.  [name], MD  05/31/2023 10:31 AM    This note was created with Dragon medical transcription system.  Any errors from dictation are purely unintentional

## 2023-05-31 NOTE — Assessment & Plan Note (Signed)
Left.  Well-healed.  Scheduled to get her left above-knee amputation prosthesis tomorrow.

## 2023-05-31 NOTE — Assessment & Plan Note (Signed)
More remote.  Also well-healed.  Already has a right below-knee amputation prosthesis.

## 2023-06-01 ENCOUNTER — Telehealth (INDEPENDENT_AMBULATORY_CARE_PROVIDER_SITE_OTHER): Payer: Self-pay

## 2023-06-01 NOTE — Telephone Encounter (Signed)
Talon called stating she needs an Rx for Physical therapy at the [facility] health in [place], so she can get a prosthetic evaluation and gait training.   Please advise

## 2023-06-02 ENCOUNTER — Other Ambulatory Visit (INDEPENDENT_AMBULATORY_CARE_PROVIDER_SITE_OTHER): Payer: Self-pay | Admitting: Nurse Practitioner

## 2023-06-02 DIAGNOSIS — Z89512 Acquired absence of left leg below knee: Secondary | ICD-10-CM

## 2023-06-02 DIAGNOSIS — Z89511 Acquired absence of right leg below knee: Secondary | ICD-10-CM

## 2023-06-02 NOTE — Telephone Encounter (Signed)
Orders placed.

## 2023-06-03 NOTE — Telephone Encounter (Signed)
Message given

## 2023-06-21 ENCOUNTER — Ambulatory Visit: Payer: 59 | Attending: Nurse Practitioner | Admitting: Physical Therapy

## 2023-06-21 DIAGNOSIS — Z89512 Acquired absence of left leg below knee: Secondary | ICD-10-CM | POA: Diagnosis not present

## 2023-06-21 DIAGNOSIS — Z89612 Acquired absence of left leg above knee: Secondary | ICD-10-CM | POA: Insufficient documentation

## 2023-06-21 DIAGNOSIS — R262 Difficulty in walking, not elsewhere classified: Secondary | ICD-10-CM | POA: Diagnosis present

## 2023-06-21 DIAGNOSIS — R2689 Other abnormalities of gait and mobility: Secondary | ICD-10-CM | POA: Diagnosis present

## 2023-06-21 DIAGNOSIS — M6281 Muscle weakness (generalized): Secondary | ICD-10-CM | POA: Diagnosis present

## 2023-06-21 DIAGNOSIS — Z89511 Acquired absence of right leg below knee: Secondary | ICD-10-CM | POA: Insufficient documentation

## 2023-06-21 NOTE — Therapy (Unsigned)
 OUTPATIENT PHYSICAL THERAPY AKA EVALUATION    Patient Name: Sylvia [name] MRN: [id number] DOB:1955/05/12, 68 y.o., female Today's Date: 06/23/2023  END OF SESSION:  PT End of Session - 06/23/23 0850     Visit Number 1    Number of Visits 21    Date for PT Re-Evaluation 09/01/23    Authorization Type [employer]    PT Start Time 0950    PT Stop Time 1034    PT Time Calculation (min) 44 min    Equipment Utilized During Treatment Gait belt   patient's personal FWW   Activity Tolerance Patient limited by fatigue;Patient tolerated treatment well   c/o bilat phantom limb pain   Behavior During Therapy WFL for tasks assessed/performed             Past Medical History:  Diagnosis Date   Anemia of chronic disease 05/16/2019   Anxiety    h/o   Arthritis    Chronic kidney disease    Cocaine abuse (HCC)    COPD (chronic obstructive pulmonary disease) (HCC)    Coronary artery disease    Depression    Diabetes mellitus without complication (HCC)    DKA (diabetic ketoacidosis) (HCC)    Elevated troponin    GERD (gastroesophageal reflux disease)    GI bleed    Hypertension    bp under control-off meds since 2019   Ischemic leg    Past Surgical History:  Procedure Laterality Date   ABDOMINAL HYSTERECTOMY     AMPUTATION Right 12/27/2018   Procedure: AMPUTATION BELOW KNEE;  Surgeon: Annice Needy, MD;  Location: [facility];  Service: General;  Laterality: Right;   AMPUTATION Left 11/05/2021   Procedure: AMPUTATION BELOW KNEE;  Surgeon: Annice Needy, MD;  Location: [facility];  Service: Vascular;  Laterality: Left;   AMPUTATION Left 11/04/2022   Procedure: AMPUTATION ABOVE KNEE;  Surgeon: Annice Needy, MD;  Location: [facility];  Service: Vascular;  Laterality: Left;   APPLICATION OF WOUND VAC  10/16/2021   Procedure: APPLICATION OF WOUND VAC;  Surgeon: Annice Needy, MD;  Location: [facility];  Service: Vascular;;   CATARACT EXTRACTION W/PHACO Right 03/27/2020   Procedure: CATARACT  EXTRACTION PHACO AND INTRAOCULAR LENS PLACEMENT (IOC) RIGHT DIABETIC 7.54 00:52.5;  Surgeon: Galen Manila, MD;  Location: [facility];  Service: Ophthalmology;  Laterality: Right;   CATARACT EXTRACTION W/PHACO Left 05/20/2020   Procedure: CATARACT EXTRACTION PHACO AND INTRAOCULAR LENS PLACEMENT (IOC) LEFT DIABETIC 4.95 00:37.6;  Surgeon: Galen Manila, MD;  Location: [facility];  Service: Ophthalmology;  Laterality: Left;  Diabetic - oral meds COVID + 04-24-20   CHOLECYSTECTOMY     COLONOSCOPY WITH PROPOFOL N/A 04/16/2019   Procedure: COLONOSCOPY WITH PROPOFOL;  Surgeon: Pasty Spillers, MD;  Location: [facility];  Service: Endoscopy;  Laterality: N/A;   COLONOSCOPY WITH PROPOFOL N/A 01/16/2020   Procedure: COLONOSCOPY WITH PROPOFOL;  Surgeon: Pasty Spillers, MD;  Location: [facility];  Service: Endoscopy;  Laterality: N/A;   ENDARTERECTOMY FEMORAL Left 10/16/2021   Procedure: ENDARTERECTOMY FEMORAL;  Surgeon: Annice Needy, MD;  Location: [facility];  Service: Vascular;  Laterality: Left;   ESOPHAGOGASTRODUODENOSCOPY (EGD) WITH PROPOFOL N/A 06/15/2018   Procedure: ESOPHAGOGASTRODUODENOSCOPY (EGD) WITH PROPOFOL;  Surgeon: Toledo, Boykin Nearing, MD;  Location: [facility];  Service: Gastroenterology;  Laterality: N/A;   ESOPHAGOGASTRODUODENOSCOPY (EGD) WITH PROPOFOL N/A 01/03/2019   Procedure: ESOPHAGOGASTRODUODENOSCOPY (EGD) WITH PROPOFOL;  Surgeon: Midge Minium, MD;  Location: [facility];  Service: Endoscopy;  Laterality: N/A;   ESOPHAGOGASTRODUODENOSCOPY (EGD) WITH PROPOFOL N/A 09/25/2021   Procedure: ESOPHAGOGASTRODUODENOSCOPY (EGD) WITH PROPOFOL;  Surgeon: Midge Minium, MD;  Location: [facility];  Service: Endoscopy;  Laterality: N/A;   EYE SURGERY Bilateral    GIVENS CAPSULE STUDY  04/16/2019   Procedure: GIVENS CAPSULE STUDY;  Surgeon: Pasty Spillers, MD;  Location: [facility];  Service: Endoscopy;;   GIVENS CAPSULE STUDY N/A  06/25/2019   Procedure: GIVENS CAPSULE STUDY;  Surgeon: Wyline Mood, MD;  Location: [facility];  Service: Gastroenterology;  Laterality: N/A;   INCISION AND DRAINAGE OF WOUND Left 02/24/2022   Procedure: LEFT BKA IRRIGATION AND DEBRIDEMENT WOUND;  Surgeon: Annice Needy, MD;  Location: [facility];  Service: Vascular;  Laterality: Left;   LOWER EXTREMITY ANGIOGRAPHY Right 08/21/2018   Procedure: LOWER EXTREMITY ANGIOGRAPHY;  Surgeon: Annice Needy, MD;  Location: [facility];  Service: Cardiovascular;  Laterality: Right;   LOWER EXTREMITY ANGIOGRAPHY Left 08/28/2018   Procedure: LOWER EXTREMITY ANGIOGRAPHY;  Surgeon: Annice Needy, MD;  Location: [facility];  Service: Cardiovascular;  Laterality: Left;   LOWER EXTREMITY ANGIOGRAPHY Right 08/28/2018   Procedure: Lower Extremity Angiography;  Surgeon: Annice Needy, MD;  Location: [facility];  Service: Cardiovascular;  Laterality: Right;   LOWER EXTREMITY ANGIOGRAPHY Right 12/18/2018   Procedure: Lower Extremity Angiography;  Surgeon: Annice Needy, MD;  Location: [facility];  Service: Cardiovascular;  Laterality: Right;   LOWER EXTREMITY ANGIOGRAPHY Right 12/18/2018   Procedure: Lower Extremity Angiography;  Surgeon: Annice Needy, MD;  Location: [facility];  Service: Cardiovascular;  Laterality: Right;   LOWER EXTREMITY ANGIOGRAPHY Left 12/21/2018   Procedure: Lower Extremity Angiography;  Surgeon: Annice Needy, MD;  Location: [facility];  Service: Cardiovascular;  Laterality: Left;   LOWER EXTREMITY ANGIOGRAPHY Right 12/21/2018   Procedure: Lower Extremity Angiography;  Surgeon: Annice Needy, MD;  Location: [facility];  Service: Cardiovascular;  Laterality: Right;   LOWER EXTREMITY ANGIOGRAPHY Left 12/25/2020   Procedure: LOWER EXTREMITY ANGIOGRAPHY;  Surgeon: Annice Needy, MD;  Location: [facility];  Service: Cardiovascular;  Laterality: Left;   LOWER EXTREMITY  ANGIOGRAPHY Left 09/21/2021   Procedure: Lower Extremity Angiography;  Surgeon: Annice Needy, MD;  Location: [facility];  Service: Cardiovascular;  Laterality: Left;   LOWER EXTREMITY ANGIOGRAPHY Left 10/13/2021   Procedure: Lower Extremity Angiography;  Surgeon: Renford Dills, MD;  Location: [facility];  Service: Cardiovascular;  Laterality: Left;   LOWER EXTREMITY ANGIOGRAPHY Left 10/14/2021   Procedure: Lower Extremity Angiography;  Surgeon: Annice Needy, MD;  Location: [facility];  Service: Cardiovascular;  Laterality: Left;   LOWER EXTREMITY ANGIOGRAPHY Left 11/02/2021   Procedure: Lower Extremity Angiography;  Surgeon: Annice Needy, MD;  Location: [facility];  Service: Cardiovascular;  Laterality: Left;   LOWER EXTREMITY INTERVENTION N/A 12/22/2018   Procedure: LOWER EXTREMITY INTERVENTION;  Surgeon: Annice Needy, MD;  Location: [facility];  Service: Cardiovascular;  Laterality: N/A;   LOWER EXTREMITY INTERVENTION Left 12/26/2020   Procedure: LOWER EXTREMITY INTERVENTION;  Surgeon: Annice Needy, MD;  Location: [facility];  Service: Cardiovascular;  Laterality: Left;   LOWER EXTREMITY INTERVENTION N/A 09/22/2021   Procedure: LOWER EXTREMITY INTERVENTION;  Surgeon: Annice Needy, MD;  Location: [facility];  Service: Cardiovascular;  Laterality: N/A;   Patient Active Problem List  Diagnosis Date Noted   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) 11/04/2022   Amputation above knee (HCC) 11/04/2022   Cellulitis of left lower extremity 02/23/2022   Sepsis due to cellulitis (HCC) 02/23/2022   Dyslipidemia 02/23/2022   Rheumatoid arthritis (HCC) 02/23/2022   Essential hypertension 02/23/2022   Type 2 diabetes mellitus with peripheral neuropathy (HCC) 02/23/2022   Multiple duodenal ulcers    Ischemic leg 12/25/2020   Rheumatoid arthritis of 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)  08/13/2020   Diabetes mellitus without complication (HCC)    GERD (gastroesophageal reflux disease)    COPD (chronic obstructive pulmonary disease) (HCC)    GIB (gastrointestinal bleeding)    HLD (hyperlipidemia)    Depression    Elevated troponin    Syncope    Elevated troponin I level    Anemia of chronic disease 05/16/2019   Dizziness    Polyp of colon    Acute upper GI bleeding 04/13/2019   Anemia associated with acute blood loss 04/13/2019   Chronic anticoagulation 04/13/2019   Hx of right BKA (HCC) 04/13/2019   Symptomatic anemia 04/13/2019   Upper GI bleed 04/13/2019   Acute GI bleeding 04/13/2019   Phantom pain after amputation of lower extremity (HCC) 01/30/2019   Melena    DU (duodenal ulcer)    DKA (diabetic ketoacidosis) (HCC) 12/14/2018   Atherosclerosis of artery of extremity with rest pain (HCC) 08/28/2018   PAD (peripheral artery disease) (HCC) 07/14/2018   Abdominal pain 06/13/2018   HTN (hypertension) 06/13/2018   Non-insulin dependent type 2 diabetes mellitus (HCC) 06/13/2018   Elevated lactic acid level 06/13/2018    PCP: [facility]  REFERRING PROVIDER: Georgiana Spinner, NP  REFERRING DIAG:  [id number] (ICD-10-CM) - Hx of right BKA (HCC)  Z89.512 (ICD-10-CM) - Hx of BKA, left (HCC)    THERAPY DIAG:  Difficulty in walking, not elsewhere classified  Muscle weakness (generalized)  Imbalance  History of amputation below knee, right (HCC)  History of above knee amputation, left (HCC)  Rationale for Evaluation and Treatment: Rehabilitation  ONSET DATE: L AKA 11/04/22   SUBJECTIVE:   SUBJECTIVE STATEMENT: Pt is a 68 year old female s/p L AKA 11/04/22. Pt has received above-knee prosthesis for L. Pt had R BKA in Sept 2020 with prosthetic rehab in this clinic. Pt is proficient with R below-knee prosthesis.  PERTINENT HISTORY: Patient reports standing with her prosthesis at home with her sister and walking with it some at  home. Pt reports dealing with phantom limb pain. Patient reports getting this in R BKA and L AKA limbs. Patient reports Gabapentin helps to control phantom limb symptoms. Pt had R BKA 4-5 years ago. Pt reports some tenderness along end of L residual limb. Pt reports that she has not used her prosthesis much over previous 2 weeks. She reports using shrinker each day and completing regular checks on skin integrity for L residual limb.  Type of amputation: Above-knee amputation  Onset: L AKA 11/04/22 Follow-up appt with MD: Pt following up with Georgiana Spinner for check-up between May and June Pain: 710 Present, 10/10 Worst: (Pain along L hip flexor/L groin) Phantom pain: Yes History of previous amputations/PAD: Yes; Hx of R BKA  Pain quality: pain quality: sharp Numbness/Tingling: Yes Imaging: No    PRECAUTIONS: None  RED FLAGS: Increased urinary urgency and functional incontinence, unknown etiology Disturbed sleep lying on L side, modified with change in position  WEIGHT BEARING RESTRICTIONS: No  FALLS:  Has patient fallen in last 6 months? No  LIVING ENVIRONMENT: Lives with:  sister, great granddaughter 67 y/o, significant other  Lives in: House/apartment; 3 steps in front to get into home; temporary living until pt finds home with flat entryway;  Shower chair in bathroom; pt able to get in/out of bathroom Stairs:  stairs into basement only, pt does not go there often Has following equipment at home: Dan Humphreys - 2 wheeled, Wheelchair (power), Wheelchair (manual), shower chair, and Grab bars; sliding board for W/C transfers  OCCUPATION: N/A  PLOF: Independent with household mobility with device  PATIENT GOALS: Get back to walking and improved independence with L AKA prosthesis  NEXT MD VISIT: Between May-June   OBJECTIVE:  Note: Objective measures were completed at Evaluation unless otherwise noted.  MUSCULOSKELETAL: Tremor: Absent Tone: Normal, no spasticity, rigidity, or  clonus Residual limb appears well-healing with no signs of erythema, skin breakdown, infection, or drainage  Posture Forward flexed posture in static standing  Palpation No pain to palpation along distal surface of residual limb.  No pain with palpation to quadriceps or hamstrings.    PATIENT SURVEYS:  ABC scale to be obtained on visit #2  COGNITION: Overall cognitive status: Within functional limits for tasks assessed     SENSATION: Not tested   LOWER EXTREMITY ROM:  Active ROM Right eval Left eval  Hip flexion WNL WNL  Hip extension WNL -5  Hip abduction 40 40  Hip adduction    Hip internal rotation    Hip external rotation    Knee flexion    Knee extension    Ankle dorsiflexion    Ankle plantarflexion    Ankle inversion    Ankle eversion     (Blank rows = not tested)  LOWER EXTREMITY MMT:  MMT Right eval Left eval  Hip flexion 4+ 4  Hip extension 4 4  Hip abduction 4+ 4+  Hip adduction    Hip internal rotation    Hip external rotation    Knee flexion 4+   Knee extension 4+   Ankle dorsiflexion    Ankle plantarflexion    Ankle inversion    Ankle eversion     (Blank rows = not tested)   FUNCTIONAL TASKS:  W/C to bed transfer: CGA with sit to stand, pt presents with heavy forward lean and weight shift toward RLE, heavy upper limb push on FWW once pt has hands on walker. Heavy verbal cueing to push from seat versus walker for transferring.  Sit to stand: Difficulty initiating with slowed velocity of first 25% of transfer following lift from mat.   GAIT: Distance walked: 60 ft Assistive device utilized: Walker - 2 wheeled Level of assistance: CGA Comments: Heavy upper limb support on walker. Step-to pattern with RLE leading. Decreased terminal knee extension of L prosthesis at initial contact that can be partially corrected with verbal cueing for swing technique. Decreased LLE weight shift during stance phase. Decreased hip extension during mid-stance  to terminal stance.  TREATMENT DATE: 06/21/23  Patient education on current condition, prognosis, plan of care. Discussion on monitoring residual limb for signs of skin breakdown and spending time in prosthesis each day with successively increasing duration in prosthesis each week. Encouraged continued use of shrinker sock.    PATIENT EDUCATION:  Reviewed previous HEP established from hospital and discussed/reviewed new exercises. [employer] handout provided (see program below).    PATIENT EDUCATION:  Education details: see above for patient education details Person educated: Patient Education method: Explanation, Demonstration, and Handouts Education comprehension: verbalized understanding and returned demonstration  HOME EXERCISE PROGRAM: Access Code: [email or web address] URL: [email or web address] Date: 06/23/2023 Prepared by: Consuela Mimes  Exercises - Prone Hip Flexor Stretch with Towel Roll (AKA)  - 2-3 x daily - 7 x weekly - 1 sets - 2-3 min hold - Supine Hip Abduction  - 2 x daily - 7 x weekly - 2 sets - 10 reps - Sidelying Hip Abduction (AKA)  - 2 x daily - 7 x weekly - 2 sets - 10 reps - Prone Hip Extension with Residual Limb (AKA)  - 2 x daily - 7 x weekly - 2 sets - 10 reps  ASSESSMENT:  CLINICAL IMPRESSION: Patient is a 68 y.o. female who was seen today for physical therapy evaluation and treatment for prosthetic rehab with Hx of R below-knee amputation 4 years ago; pt is currently s/p L above-knee amputation and has her above-knee prosthesis with which she has some limited practice at home with her sister. Pt is proficient with R below-knee prosthesis for which she completed rehab in 2020. Patient is able to complete ambulation with FWW and W/C following patient today in clinic as well as CGA to MinA transfers. Patient presents with prosthetic  gait deviations, hip weakness, imbalance, and difficulty with weight shifting/transfers. Pt will continue to benefit from skilled PT services to address deficits and improve function.  OBJECTIVE IMPAIRMENTS: Abnormal gait, decreased balance, decreased mobility, difficulty walking, decreased strength, prosthetic dependency , and pain.   ACTIVITY LIMITATIONS: carrying, standing, stairs, transfers, bed mobility, and locomotion level  PARTICIPATION LIMITATIONS: cleaning, laundry, shopping, and community activity  PERSONAL FACTORS: Age, Past/current experiences, and 3+ comorbidities: (anxiety, depression, CKD, COPD, DM, DKA, HTN lower extremity ischemia)  are also affecting patient's functional outcome.   REHAB POTENTIAL: Good  CLINICAL DECISION MAKING: Evolving/moderate complexity  EVALUATION COMPLEXITY: Moderate   GOALS: Goals reviewed with patient? Yes  SHORT TERM GOALS: Target date: 07/14/23 Patient will be independent and 100% compliant with HEP as needed for carryover of PT intervention Baseline: 06/21/23: Baseline HEP reviewed.  Goal status: INITIAL  2.  Patient will attain L hip hyperextension to 15 deg indicative of improved ROM required for terminal stance/heel off during gait Baseline: 06/21/23: L hip extension -5 Goal status: INITIAL  3.  Patient will demonstrate modified-independent sit to stand and stand-pivot transfer from W/C to adjacent sitting surface with no LOB and no verbal cueing required from therapist for technique Baseline: 06/21/23: Difficulty and some cueing needed for safety with transferring and stand-pivot transfer Goal status: INITIAL   LONG TERM GOALS: Target date: 09/01/23  Patient will ambulate at community-level distance with front-wheeled walker ModI with no LOB and normalized hip extension and swing through for L above-knee prosthesis Baseline: 06/21/23: Noted gait deviations and pt ambulating short of household distance with L AKA prosthesis Goal status:  INITIAL  2.  Pt will improve ABC Scale by 13% or greater indicative of improved confidence related to balance and fall risk and  clinically meaningful change Baseline: 06/21/23: ABC Scale to be obtained on visit # 2.  Goal status: INITIAL  3.  Pt will perform TUG in less than 20 seconds indicative of decreased risk of falls relative to patient-specific population Baseline: 06/21/23: TUG to be completed on visit # 2.  Goal status: INITIAL  4.  Pt will improve Amputee Mobility Predictor by 4 points or greater indicative of detectable change in ADLs and functional mobility with prostheses.  Baseline: 06/21/23: AMPPRO to be completed on visits #2-3.  Goal status: INITIAL  5.  Patient will negotiate 3 steps ModI with FWW as needed for entering/exiting her home. Baseline: 06/21/23: Significant difficulty with stair negotiation.  Goal status: INITIAL    PLAN:  PT FREQUENCY: 2x/week  PT DURATION: 10 weeks  PLANNED INTERVENTIONS: 97164- PT Re-evaluation, 97110-Therapeutic exercises, 97530- Therapeutic activity, [id number]- Neuromuscular re-education, [phone]- Self Care, [id number]- Prosthetic training, Patient/Family education, and Balance training  PLAN FOR NEXT SESSION: Continue with weight shifting and gait training with new L AKA prosthesis, correcting gait deviations. Provide education and handout for phantom limb pain. Open-chain strengthening for BLE.    Consuela Mimes, PT, DPT #[id number]  Gertie Exon, PT 06/23/2023, 8:52 AM

## 2023-06-23 ENCOUNTER — Encounter: Payer: Self-pay | Admitting: Physical Therapy

## 2023-06-27 ENCOUNTER — Ambulatory Visit: Admitting: Physical Therapy

## 2023-06-27 ENCOUNTER — Encounter: Payer: Self-pay | Admitting: Physical Therapy

## 2023-06-27 DIAGNOSIS — M6281 Muscle weakness (generalized): Secondary | ICD-10-CM

## 2023-06-27 DIAGNOSIS — R262 Difficulty in walking, not elsewhere classified: Secondary | ICD-10-CM

## 2023-06-27 DIAGNOSIS — R2689 Other abnormalities of gait and mobility: Secondary | ICD-10-CM

## 2023-06-27 DIAGNOSIS — Z89511 Acquired absence of right leg below knee: Secondary | ICD-10-CM

## 2023-06-27 DIAGNOSIS — Z89612 Acquired absence of left leg above knee: Secondary | ICD-10-CM

## 2023-06-27 NOTE — Therapy (Unsigned)
 OUTPATIENT PHYSICAL THERAPY TREATMENT    Patient Name: Sylvia [name] MRN: [id number] DOB:[birth year], 68 y.o., female Today's Date: 06/27/2023  END OF SESSION:  PT End of Session - 06/27/23 0958     Visit Number 2    Number of Visits 21    Date for PT Re-Evaluation 09/01/23    Authorization Type [employer]    PT Start Time 0955    PT Stop Time 1037    PT Time Calculation (min) 42 min    Equipment Utilized During Treatment Gait belt   patient's personal FWW   Activity Tolerance Patient limited by fatigue;Patient tolerated treatment well   c/o bilat phantom limb pain   Behavior During Therapy WFL for tasks assessed/performed              Past Medical History:  Diagnosis Date   Anemia of chronic disease 05/16/2019   Anxiety    h/o   Arthritis    Chronic kidney disease    Cocaine abuse (HCC)    COPD (chronic obstructive pulmonary disease) (HCC)    Coronary artery disease    Depression    Diabetes mellitus without complication (HCC)    DKA (diabetic ketoacidosis) (HCC)    Elevated troponin    GERD (gastroesophageal reflux disease)    GI bleed    Hypertension    bp under control-off meds since 2019   Ischemic leg    Past Surgical History:  Procedure Laterality Date   ABDOMINAL HYSTERECTOMY     AMPUTATION Right 12/27/2018   Procedure: AMPUTATION BELOW KNEE;  Surgeon: Annice Needy, MD;  Location: [facility];  Service: General;  Laterality: Right;   AMPUTATION Left 11/05/2021   Procedure: AMPUTATION BELOW KNEE;  Surgeon: Annice Needy, MD;  Location: [facility];  Service: Vascular;  Laterality: Left;   AMPUTATION Left 11/04/2022   Procedure: AMPUTATION ABOVE KNEE;  Surgeon: Annice Needy, MD;  Location: [facility];  Service: Vascular;  Laterality: Left;   APPLICATION OF WOUND VAC  10/16/2021   Procedure: APPLICATION OF WOUND VAC;  Surgeon: Annice Needy, MD;  Location: [facility];  Service: Vascular;;   CATARACT EXTRACTION W/PHACO Right 03/27/2020   Procedure: CATARACT  EXTRACTION PHACO AND INTRAOCULAR LENS PLACEMENT (IOC) RIGHT DIABETIC 7.54 00:52.5;  Surgeon: Galen Manila, MD;  Location: [facility];  Service: Ophthalmology;  Laterality: Right;   CATARACT EXTRACTION W/PHACO Left 05/20/2020   Procedure: CATARACT EXTRACTION PHACO AND INTRAOCULAR LENS PLACEMENT (IOC) LEFT DIABETIC 4.95 00:37.6;  Surgeon: Galen Manila, MD;  Location: [facility];  Service: Ophthalmology;  Laterality: Left;  Diabetic - oral meds COVID + 04-24-20   CHOLECYSTECTOMY     COLONOSCOPY WITH PROPOFOL N/A 04/16/2019   Procedure: COLONOSCOPY WITH PROPOFOL;  Surgeon: Pasty Spillers, MD;  Location: [facility];  Service: Endoscopy;  Laterality: N/A;   COLONOSCOPY WITH PROPOFOL N/A 01/16/2020   Procedure: COLONOSCOPY WITH PROPOFOL;  Surgeon: Pasty Spillers, MD;  Location: [facility];  Service: Endoscopy;  Laterality: N/A;   ENDARTERECTOMY FEMORAL Left 10/16/2021   Procedure: ENDARTERECTOMY FEMORAL;  Surgeon: Annice Needy, MD;  Location: [facility];  Service: Vascular;  Laterality: Left;   ESOPHAGOGASTRODUODENOSCOPY (EGD) WITH PROPOFOL N/A 06/15/2018   Procedure: ESOPHAGOGASTRODUODENOSCOPY (EGD) WITH PROPOFOL;  Surgeon: Toledo, Boykin Nearing, MD;  Location: [facility];  Service: Gastroenterology;  Laterality: N/A;   ESOPHAGOGASTRODUODENOSCOPY (EGD) WITH PROPOFOL N/A 01/03/2019   Procedure: ESOPHAGOGASTRODUODENOSCOPY (EGD) WITH PROPOFOL;  Surgeon: Midge Minium, MD;  Location: [facility];  Service: Endoscopy;  Laterality: N/A;   ESOPHAGOGASTRODUODENOSCOPY (EGD) WITH PROPOFOL N/A 09/25/2021   Procedure: ESOPHAGOGASTRODUODENOSCOPY (EGD) WITH PROPOFOL;  Surgeon: Midge Minium, MD;  Location: [facility];  Service: Endoscopy;  Laterality: N/A;   EYE SURGERY Bilateral    GIVENS CAPSULE STUDY  04/16/2019   Procedure: GIVENS CAPSULE STUDY;  Surgeon: Pasty Spillers, MD;  Location: [facility];  Service: Endoscopy;;   GIVENS CAPSULE STUDY N/A  06/25/2019   Procedure: GIVENS CAPSULE STUDY;  Surgeon: Wyline Mood, MD;  Location: [facility];  Service: Gastroenterology;  Laterality: N/A;   INCISION AND DRAINAGE OF WOUND Left 02/24/2022   Procedure: LEFT BKA IRRIGATION AND DEBRIDEMENT WOUND;  Surgeon: Annice Needy, MD;  Location: [facility];  Service: Vascular;  Laterality: Left;   LOWER EXTREMITY ANGIOGRAPHY Right 08/21/2018   Procedure: LOWER EXTREMITY ANGIOGRAPHY;  Surgeon: Annice Needy, MD;  Location: [facility];  Service: Cardiovascular;  Laterality: Right;   LOWER EXTREMITY ANGIOGRAPHY Left 08/28/2018   Procedure: LOWER EXTREMITY ANGIOGRAPHY;  Surgeon: Annice Needy, MD;  Location: [facility];  Service: Cardiovascular;  Laterality: Left;   LOWER EXTREMITY ANGIOGRAPHY Right 08/28/2018   Procedure: Lower Extremity Angiography;  Surgeon: Annice Needy, MD;  Location: [facility];  Service: Cardiovascular;  Laterality: Right;   LOWER EXTREMITY ANGIOGRAPHY Right 12/18/2018   Procedure: Lower Extremity Angiography;  Surgeon: Annice Needy, MD;  Location: [facility];  Service: Cardiovascular;  Laterality: Right;   LOWER EXTREMITY ANGIOGRAPHY Right 12/18/2018   Procedure: Lower Extremity Angiography;  Surgeon: Annice Needy, MD;  Location: [facility];  Service: Cardiovascular;  Laterality: Right;   LOWER EXTREMITY ANGIOGRAPHY Left 12/21/2018   Procedure: Lower Extremity Angiography;  Surgeon: Annice Needy, MD;  Location: [facility];  Service: Cardiovascular;  Laterality: Left;   LOWER EXTREMITY ANGIOGRAPHY Right 12/21/2018   Procedure: Lower Extremity Angiography;  Surgeon: Annice Needy, MD;  Location: [facility];  Service: Cardiovascular;  Laterality: Right;   LOWER EXTREMITY ANGIOGRAPHY Left 12/25/2020   Procedure: LOWER EXTREMITY ANGIOGRAPHY;  Surgeon: Annice Needy, MD;  Location: [facility];  Service: Cardiovascular;  Laterality: Left;   LOWER EXTREMITY  ANGIOGRAPHY Left 09/21/2021   Procedure: Lower Extremity Angiography;  Surgeon: Annice Needy, MD;  Location: [facility];  Service: Cardiovascular;  Laterality: Left;   LOWER EXTREMITY ANGIOGRAPHY Left 10/13/2021   Procedure: Lower Extremity Angiography;  Surgeon: Renford Dills, MD;  Location: [facility];  Service: Cardiovascular;  Laterality: Left;   LOWER EXTREMITY ANGIOGRAPHY Left 10/14/2021   Procedure: Lower Extremity Angiography;  Surgeon: Annice Needy, MD;  Location: [facility];  Service: Cardiovascular;  Laterality: Left;   LOWER EXTREMITY ANGIOGRAPHY Left 11/02/2021   Procedure: Lower Extremity Angiography;  Surgeon: Annice Needy, MD;  Location: [facility];  Service: Cardiovascular;  Laterality: Left;   LOWER EXTREMITY INTERVENTION N/A 12/22/2018   Procedure: LOWER EXTREMITY INTERVENTION;  Surgeon: Annice Needy, MD;  Location: [facility];  Service: Cardiovascular;  Laterality: N/A;   LOWER EXTREMITY INTERVENTION Left 12/26/2020   Procedure: LOWER EXTREMITY INTERVENTION;  Surgeon: Annice Needy, MD;  Location: [facility];  Service: Cardiovascular;  Laterality: Left;   LOWER EXTREMITY INTERVENTION N/A 09/22/2021   Procedure: LOWER EXTREMITY INTERVENTION;  Surgeon: Annice Needy, MD;  Location: [facility];  Service: Cardiovascular;  Laterality: N/A;   Patient Active Problem List  Diagnosis Date Noted   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) 11/04/2022   Amputation above knee (HCC) 11/04/2022   Cellulitis of left lower extremity 02/23/2022   Sepsis due to cellulitis (HCC) 02/23/2022   Dyslipidemia 02/23/2022   Rheumatoid arthritis (HCC) 02/23/2022   Essential hypertension 02/23/2022   Type 2 diabetes mellitus with peripheral neuropathy (HCC) 02/23/2022   Multiple duodenal ulcers    Ischemic leg 12/25/2020   Rheumatoid arthritis of 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)  08/13/2020   Diabetes mellitus without complication (HCC)    GERD (gastroesophageal reflux disease)    COPD (chronic obstructive pulmonary disease) (HCC)    GIB (gastrointestinal bleeding)    HLD (hyperlipidemia)    Depression    Elevated troponin    Syncope    Elevated troponin I level    Anemia of chronic disease 05/16/2019   Dizziness    Polyp of colon    Acute upper GI bleeding 04/13/2019   Anemia associated with acute blood loss 04/13/2019   Chronic anticoagulation 04/13/2019   Hx of right BKA (HCC) 04/13/2019   Symptomatic anemia 04/13/2019   Upper GI bleed 04/13/2019   Acute GI bleeding 04/13/2019   Phantom pain after amputation of lower extremity (HCC) 01/30/2019   Melena    DU (duodenal ulcer)    DKA (diabetic ketoacidosis) (HCC) 12/14/2018   Atherosclerosis of artery of extremity with rest pain (HCC) 08/28/2018   PAD (peripheral artery disease) (HCC) 07/14/2018   Abdominal pain 06/13/2018   HTN (hypertension) 06/13/2018   Non-insulin dependent type 2 diabetes mellitus (HCC) 06/13/2018   Elevated lactic acid level 06/13/2018    PCP: [facility]ine[facility]  REFERRING PROVIDER: Georgiana Spinner, NP  REFERRING DIAG:  [phone] (ICD-10-CM) - Hx of right BKA (HCC)  Z89.512 (ICD-10-CM) - Hx of BKA, left (HCC)    THERAPY DIAG:  Difficulty in walking, not elsewhere classified  Muscle weakness (generalized)  Imbalance  History of amputation below knee, right (HCC)  History of above knee amputation, left (HCC)  Rationale for Evaluation and Treatment: Rehabilitation  ONSET DATE: L AKA 11/04/22   SUBJECTIVE:   SUBJECTIVE STATEMENT: Pt is a 68 year old female s/p L AKA 11/04/22. Pt has received above-knee prosthesis for L. Pt had R BKA in Sept 2020 with prosthetic rehab in this clinic. Pt is proficient with R below-knee prosthesis.  PERTINENT HISTORY: Patient reports standing with her prosthesis at home with her sister and walking with it some at  home. Pt reports dealing with phantom limb pain. Patient reports getting this in R BKA and L AKA limbs. Patient reports Gabapentin helps to control phantom limb symptoms. Pt had R BKA 4-5 years ago. Pt reports some tenderness along end of L residual limb. Pt reports that she has not used her prosthesis much over previous 2 weeks. She reports using shrinker each day and completing regular checks on skin integrity for L residual limb.  Type of amputation: Above-knee amputation  Onset: L AKA 11/04/22 Follow-up appt with MD: Pt following up with Georgiana Spinner for check-up between May and June Pain: 710 Present, 10/10 Worst: (Pain along L hip flexor/L groin) Phantom pain: Yes History of previous amputations/PAD: Yes; Hx of R BKA  Pain quality: pain quality: sharp Numbness/Tingling: Yes Imaging: No    PRECAUTIONS: None  RED FLAGS: Increased urinary urgency and functional incontinence, unknown etiology Disturbed sleep lying on L side, modified with change in position  WEIGHT BEARING RESTRICTIONS: No  FALLS:  Has patient fallen in last 6 months? No  LIVING ENVIRONMENT: Lives with:  sister, great granddaughter 20 y/o, significant other  Lives in: House/apartment; 3 steps in front to get into home; temporary living until pt finds home with flat entryway;  Shower chair in bathroom; pt able to get in/out of bathroom Stairs:  stairs into basement only, pt does not go there often Has following equipment at home: Dan Humphreys - 2 wheeled, Wheelchair (power), Wheelchair (manual), shower chair, and Grab bars; sliding board for W/C transfers  OCCUPATION: N/A  PLOF: Independent with household mobility with device  PATIENT GOALS: Get back to walking and improved independence with L AKA prosthesis  NEXT MD VISIT: Between May-June   OBJECTIVE:  Note: Objective measures were completed at Evaluation unless otherwise noted.  MUSCULOSKELETAL: Tremor: Absent Tone: Normal, no spasticity, rigidity, or  clonus Residual limb appears well-healing with no signs of erythema, skin breakdown, infection, or drainage  Posture Forward flexed posture in static standing  Palpation No pain to palpation along distal surface of residual limb.  No pain with palpation to quadriceps or hamstrings.    PATIENT SURVEYS:  ABC scale 6.3%  COGNITION: Overall cognitive status: Within functional limits for tasks assessed     SENSATION: Not tested   LOWER EXTREMITY ROM:  Active ROM Right eval Left eval  Hip flexion WNL WNL  Hip extension WNL -5  Hip abduction 40 40  Hip adduction    Hip internal rotation    Hip external rotation    Knee flexion    Knee extension    Ankle dorsiflexion    Ankle plantarflexion    Ankle inversion    Ankle eversion     (Blank rows = not tested)  LOWER EXTREMITY MMT:  MMT Right eval Left eval  Hip flexion 4+ 4  Hip extension 4 4  Hip abduction 4+ 4+  Hip adduction    Hip internal rotation    Hip external rotation    Knee flexion 4+   Knee extension 4+   Ankle dorsiflexion    Ankle plantarflexion    Ankle inversion    Ankle eversion     (Blank rows = not tested)   FUNCTIONAL TASKS:  W/C to bed transfer: CGA with sit to stand, pt presents with heavy forward lean and weight shift toward RLE, heavy upper limb push on FWW once pt has hands on walker. Heavy verbal cueing to push from seat versus walker for transferring.  Sit to stand: Difficulty initiating with slowed velocity of first 25% of transfer following lift from mat.   GAIT: Distance walked: 60 ft Assistive device utilized: Walker - 2 wheeled Level of assistance: CGA Comments: Heavy upper limb support on walker. Step-to pattern with RLE leading. Decreased terminal knee extension of L prosthesis at initial contact that can be partially corrected with verbal cueing for swing technique. Decreased LLE weight shift during stance phase. Decreased hip extension during mid-stance to terminal stance.  TREATMENT DATE: 06/27/2023    SUBJECTIVE STATEMENT:   Patient reports spending about 2 hours in bilat prostheses yesterday. Patient reports no phantom limb pain at arrival. Patient reports doing okay with initial exercises. Patient reports working on HEP with her sister.   Completion of ABC Scale   Therapeutic Activities - patient education, repetitive task practice for improved performance of daily functional activities e.g. transferring  Ambulate laps around gym with PT CGA and rolling W/C behind patient; 2 laps  -step-to pattern with LLE first, mild forward flexed posture Stand pivot transfer x 2 with PT CGA  -verbal cueing for hand placement  TUG performance: 2 min, 16 sec   Neuromuscular Re-education - postural control and desensitization/mirror therapy  Reviewed mirror therapy technique and provided handout for carryover of technique   Bilateral stance with emphasis on bilateral weightbearing without asymmetrical weight shift to RLE (BKA leg); x 30 sec Weight shifting with bilateral symmetrical stance; x 20 alternating R/L     PATIENT EDUCATION:  Education details: see above for patient education details Person educated: Patient Education method: Explanation, Demonstration, and Handouts Education comprehension: verbalized understanding and returned demonstration  HOME EXERCISE PROGRAM: Access Code: [email or web address] URL: [email or web address] Date: 06/23/2023 Prepared by: Consuela Mimes  Exercises - Prone Hip Flexor Stretch with Towel Roll (AKA)  - 2-3 x daily - 7 x weekly - 1 sets - 2-3 min hold - Supine Hip Abduction  - 2 x daily - 7 x weekly - 2 sets - 10 reps - Sidelying Hip Abduction (AKA)  - 2 x daily - 7 x weekly - 2 sets - 10 reps - Prone Hip Extension with Residual Limb (AKA)  - 2 x daily - 7 x weekly - 2 sets - 10  reps  ASSESSMENT:  CLINICAL IMPRESSION: Patient is able to complete short-distance ambulation in gym with step-to pattern and no significant LOB, though she has notably slowed velocity and asymmetrical standing. We utilized mirror therapy to work on symmetrical weightbearing and weight shifting to each LE. Mirror therapy technique was reviewed for home to treat phantom limb pain. Patient presents with prosthetic gait deviations, hip weakness, imbalance, and difficulty with weight shifting/transfers. Pt will continue to benefit from skilled PT services to address deficits and improve function.  OBJECTIVE IMPAIRMENTS: Abnormal gait, decreased balance, decreased mobility, difficulty walking, decreased strength, prosthetic dependency , and pain.   ACTIVITY LIMITATIONS: carrying, standing, stairs, transfers, bed mobility, and locomotion level  PARTICIPATION LIMITATIONS: cleaning, laundry, shopping, and community activity  PERSONAL FACTORS: Age, Past/current experiences, and 3+ comorbidities: (anxiety, depression, CKD, COPD, DM, DKA, HTN lower extremity ischemia)  are also affecting patient's functional outcome.   REHAB POTENTIAL: Good  CLINICAL DECISION MAKING: Evolving/moderate complexity  EVALUATION COMPLEXITY: Moderate   GOALS: Goals reviewed with patient? Yes  SHORT TERM GOALS: Target date: 07/14/23 Patient will be independent and 100% compliant with HEP as needed for carryover of PT intervention Baseline: 06/21/23: Baseline HEP reviewed.  Goal status: INITIAL  2.  Patient will attain L hip hyperextension to 15 deg indicative of improved ROM required for terminal stance/heel off during gait Baseline: 06/21/23: L hip extension -5 Goal status: INITIAL  3.  Patient will demonstrate modified-independent sit to stand and stand-pivot transfer from W/C to adjacent sitting surface with no LOB and no verbal cueing required from therapist for technique Baseline: 06/21/23: Difficulty and some cueing  needed for safety with transferring and stand-pivot transfer Goal status: INITIAL   LONG TERM GOALS: Target date: 09/01/23  Patient  will ambulate at community-level distance with front-wheeled walker ModI with no LOB and normalized hip extension and swing through for L above-knee prosthesis Baseline: 06/21/23: Noted gait deviations and pt ambulating short of household distance with L AKA prosthesis Goal status: INITIAL  2.  Pt will improve ABC Scale by 13% or greater indicative of improved confidence related to balance and fall risk and clinically meaningful change Baseline: 06/21/23: ABC Scale to be obtained on visit # 2.   06/27/23: 6.3% Goal status: INITIAL  3.  Pt will perform TUG in less than 20 seconds indicative of decreased risk of falls relative to patient-specific population Baseline: 06/21/23: TUG to be completed on visit # 2.   06/27/23: 136 sec Goal status: INITIAL  4.  Pt will improve Amputee Mobility Predictor by 4 points or greater indicative of detectable change in ADLs and functional mobility with prostheses.  Baseline: 06/21/23: AMPPRO to be completed on visits #2-3.  Goal status: INITIAL  5.  Patient will negotiate 3 steps ModI with FWW as needed for entering/exiting her home. Baseline: 06/21/23: Significant difficulty with stair negotiation.  Goal status: INITIAL    PLAN:  PT FREQUENCY: 2x/week  PT DURATION: 10 weeks  PLANNED INTERVENTIONS: 97164- PT Re-evaluation, 97110-Therapeutic exercises, 97530- Therapeutic activity, [id number]- Neuromuscular re-education, [phone]- Self Care, 60454- Prosthetic training, Patient/Family education, and Balance training  PLAN FOR NEXT SESSION: Continue with weight shifting and gait training with new L AKA prosthesis, correcting gait deviations. Provide education and handout for phantom limb pain. Open-chain strengthening for BLE.    Consuela Mimes, PT, DPT #[id number]  Gertie Exon, PT 06/27/2023, 9:58 AM

## 2023-06-28 ENCOUNTER — Ambulatory Visit: Payer: 59

## 2023-06-28 DIAGNOSIS — R262 Difficulty in walking, not elsewhere classified: Secondary | ICD-10-CM

## 2023-06-28 DIAGNOSIS — M6281 Muscle weakness (generalized): Secondary | ICD-10-CM

## 2023-06-28 NOTE — Therapy (Deleted)
 OUTPATIENT PHYSICAL THERAPY TREATMENT    Patient Name: Sylvia [name] MRN: [id number] DOB:[birth year], 68 y.o., female Today's Date: 06/28/2023  END OF SESSION:  PT End of Session - 06/28/23 1012     Visit Number 3    Number of Visits 21    Date for PT Re-Evaluation 09/01/23    Authorization Type [employer]    PT Start Time 1015    PT Stop Time 1100    PT Time Calculation (min) 45 min    Equipment Utilized During Treatment Gait belt   patient's personal FWW   Activity Tolerance Patient limited by fatigue;Patient tolerated treatment well   c/o bilat phantom limb pain   Behavior During Therapy WFL for tasks assessed/performed               Past Medical History:  Diagnosis Date   Anemia of chronic disease 05/16/2019   Anxiety    h/o   Arthritis    Chronic kidney disease    Cocaine abuse (HCC)    COPD (chronic obstructive pulmonary disease) (HCC)    Coronary artery disease    Depression    Diabetes mellitus without complication (HCC)    DKA (diabetic ketoacidosis) (HCC)    Elevated troponin    GERD (gastroesophageal reflux disease)    GI bleed    Hypertension    bp under control-off meds since 2019   Ischemic leg    Past Surgical History:  Procedure Laterality Date   ABDOMINAL HYSTERECTOMY     AMPUTATION Right 12/27/2018   Procedure: AMPUTATION BELOW KNEE;  Surgeon: Annice Needy, MD;  Location: [facility];  Service: General;  Laterality: Right;   AMPUTATION Left 11/05/2021   Procedure: AMPUTATION BELOW KNEE;  Surgeon: Annice Needy, MD;  Location: [facility];  Service: Vascular;  Laterality: Left;   AMPUTATION Left 11/04/2022   Procedure: AMPUTATION ABOVE KNEE;  Surgeon: Annice Needy, MD;  Location: [facility];  Service: Vascular;  Laterality: Left;   APPLICATION OF WOUND VAC  10/16/2021   Procedure: APPLICATION OF WOUND VAC;  Surgeon: Annice Needy, MD;  Location: [facility];  Service: Vascular;;   CATARACT EXTRACTION W/PHACO Right 03/27/2020   Procedure: CATARACT  EXTRACTION PHACO AND INTRAOCULAR LENS PLACEMENT (IOC) RIGHT DIABETIC 7.54 00:52.5;  Surgeon: Galen Manila, MD;  Location: [facility];  Service: Ophthalmology;  Laterality: Right;   CATARACT EXTRACTION W/PHACO Left 05/20/2020   Procedure: CATARACT EXTRACTION PHACO AND INTRAOCULAR LENS PLACEMENT (IOC) LEFT DIABETIC 4.95 00:37.6;  Surgeon: Galen Manila, MD;  Location: [facility];  Service: Ophthalmology;  Laterality: Left;  Diabetic - oral meds COVID + 04-24-20   CHOLECYSTECTOMY     COLONOSCOPY WITH PROPOFOL N/A 04/16/2019   Procedure: COLONOSCOPY WITH PROPOFOL;  Surgeon: Pasty Spillers, MD;  Location: [facility];  Service: Endoscopy;  Laterality: N/A;   COLONOSCOPY WITH PROPOFOL N/A 01/16/2020   Procedure: COLONOSCOPY WITH PROPOFOL;  Surgeon: Pasty Spillers, MD;  Location: [facility];  Service: Endoscopy;  Laterality: N/A;   ENDARTERECTOMY FEMORAL Left 10/16/2021   Procedure: ENDARTERECTOMY FEMORAL;  Surgeon: Annice Needy, MD;  Location: [facility];  Service: Vascular;  Laterality: Left;   ESOPHAGOGASTRODUODENOSCOPY (EGD) WITH PROPOFOL N/A 06/15/2018   Procedure: ESOPHAGOGASTRODUODENOSCOPY (EGD) WITH PROPOFOL;  Surgeon: Toledo, Boykin Nearing, MD;  Location: [facility];  Service: Gastroenterology;  Laterality: N/A;   ESOPHAGOGASTRODUODENOSCOPY (EGD) WITH PROPOFOL N/A 01/03/2019   Procedure: ESOPHAGOGASTRODUODENOSCOPY (EGD) WITH PROPOFOL;  Surgeon: Midge Minium, MD;  Location: [facility];  Service: Endoscopy;  Laterality: N/A;   ESOPHAGOGASTRODUODENOSCOPY (EGD) WITH PROPOFOL N/A 09/25/2021   Procedure: ESOPHAGOGASTRODUODENOSCOPY (EGD) WITH PROPOFOL;  Surgeon: Midge Minium, MD;  Location: [facility];  Service: Endoscopy;  Laterality: N/A;   EYE SURGERY Bilateral    GIVENS CAPSULE STUDY  04/16/2019   Procedure: GIVENS CAPSULE STUDY;  Surgeon: Pasty Spillers, MD;  Location: [facility];  Service: Endoscopy;;   GIVENS CAPSULE STUDY N/A  06/25/2019   Procedure: GIVENS CAPSULE STUDY;  Surgeon: Wyline Mood, MD;  Location: [facility];  Service: Gastroenterology;  Laterality: N/A;   INCISION AND DRAINAGE OF WOUND Left 02/24/2022   Procedure: LEFT BKA IRRIGATION AND DEBRIDEMENT WOUND;  Surgeon: Annice Needy, MD;  Location: [facility];  Service: Vascular;  Laterality: Left;   LOWER EXTREMITY ANGIOGRAPHY Right 08/21/2018   Procedure: LOWER EXTREMITY ANGIOGRAPHY;  Surgeon: Annice Needy, MD;  Location: [facility];  Service: Cardiovascular;  Laterality: Right;   LOWER EXTREMITY ANGIOGRAPHY Left 08/28/2018   Procedure: LOWER EXTREMITY ANGIOGRAPHY;  Surgeon: Annice Needy, MD;  Location: [facility];  Service: Cardiovascular;  Laterality: Left;   LOWER EXTREMITY ANGIOGRAPHY Right 08/28/2018   Procedure: Lower Extremity Angiography;  Surgeon: Annice Needy, MD;  Location: [facility];  Service: Cardiovascular;  Laterality: Right;   LOWER EXTREMITY ANGIOGRAPHY Right 12/18/2018   Procedure: Lower Extremity Angiography;  Surgeon: Annice Needy, MD;  Location: [facility];  Service: Cardiovascular;  Laterality: Right;   LOWER EXTREMITY ANGIOGRAPHY Right 12/18/2018   Procedure: Lower Extremity Angiography;  Surgeon: Annice Needy, MD;  Location: [facility];  Service: Cardiovascular;  Laterality: Right;   LOWER EXTREMITY ANGIOGRAPHY Left 12/21/2018   Procedure: Lower Extremity Angiography;  Surgeon: Annice Needy, MD;  Location: [facility];  Service: Cardiovascular;  Laterality: Left;   LOWER EXTREMITY ANGIOGRAPHY Right 12/21/2018   Procedure: Lower Extremity Angiography;  Surgeon: Annice Needy, MD;  Location: [facility];  Service: Cardiovascular;  Laterality: Right;   LOWER EXTREMITY ANGIOGRAPHY Left 12/25/2020   Procedure: LOWER EXTREMITY ANGIOGRAPHY;  Surgeon: Annice Needy, MD;  Location: [facility];  Service: Cardiovascular;  Laterality: Left;   LOWER EXTREMITY  ANGIOGRAPHY Left 09/21/2021   Procedure: Lower Extremity Angiography;  Surgeon: Annice Needy, MD;  Location: [facility];  Service: Cardiovascular;  Laterality: Left;   LOWER EXTREMITY ANGIOGRAPHY Left 10/13/2021   Procedure: Lower Extremity Angiography;  Surgeon: Renford Dills, MD;  Location: [facility];  Service: Cardiovascular;  Laterality: Left;   LOWER EXTREMITY ANGIOGRAPHY Left 10/14/2021   Procedure: Lower Extremity Angiography;  Surgeon: Annice Needy, MD;  Location: [facility];  Service: Cardiovascular;  Laterality: Left;   LOWER EXTREMITY ANGIOGRAPHY Left 11/02/2021   Procedure: Lower Extremity Angiography;  Surgeon: Annice Needy, MD;  Location: [facility];  Service: Cardiovascular;  Laterality: Left;   LOWER EXTREMITY INTERVENTION N/A 12/22/2018   Procedure: LOWER EXTREMITY INTERVENTION;  Surgeon: Annice Needy, MD;  Location: [facility];  Service: Cardiovascular;  Laterality: N/A;   LOWER EXTREMITY INTERVENTION Left 12/26/2020   Procedure: LOWER EXTREMITY INTERVENTION;  Surgeon: Annice Needy, MD;  Location: [facility];  Service: Cardiovascular;  Laterality: Left;   LOWER EXTREMITY INTERVENTION N/A 09/22/2021   Procedure: LOWER EXTREMITY INTERVENTION;  Surgeon: Annice Needy, MD;  Location: [facility];  Service: Cardiovascular;  Laterality: N/A;   Patient Active Problem List  Diagnosis Date Noted   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) 11/04/2022   Amputation above knee (HCC) 11/04/2022   Cellulitis of left lower extremity 02/23/2022   Sepsis due to cellulitis (HCC) 02/23/2022   Dyslipidemia 02/23/2022   Rheumatoid arthritis (HCC) 02/23/2022   Essential hypertension 02/23/2022   Type 2 diabetes mellitus with peripheral neuropathy (HCC) 02/23/2022   Multiple duodenal ulcers    Ischemic leg 12/25/2020   Rheumatoid arthritis of 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)  08/13/2020   Diabetes mellitus without complication (HCC)    GERD (gastroesophageal reflux disease)    COPD (chronic obstructive pulmonary disease) (HCC)    GIB (gastrointestinal bleeding)    HLD (hyperlipidemia)    Depression    Elevated troponin    Syncope    Elevated troponin I level    Anemia of chronic disease 05/16/2019   Dizziness    Polyp of colon    Acute upper GI bleeding 04/13/2019   Anemia associated with acute blood loss 04/13/2019   Chronic anticoagulation 04/13/2019   Hx of right BKA (HCC) 04/13/2019   Symptomatic anemia 04/13/2019   Upper GI bleed 04/13/2019   Acute GI bleeding 04/13/2019   Phantom pain after amputation of lower extremity (HCC) 01/30/2019   Melena    DU (duodenal ulcer)    DKA (diabetic ketoacidosis) (HCC) 12/14/2018   Atherosclerosis of artery of extremity with rest pain (HCC) 08/28/2018   PAD (peripheral artery disease) (HCC) 07/14/2018   Abdominal pain 06/13/2018   HTN (hypertension) 06/13/2018   Non-insulin dependent type 2 diabetes mellitus (HCC) 06/13/2018   Elevated lactic acid level 06/13/2018    PCP: [facility]  REFERRING PROVIDER: Georgiana Spinner, NP  REFERRING DIAG:  [phone] (ICD-10-CM) - Hx of right BKA (HCC)  Z89.512 (ICD-10-CM) - Hx of BKA, left (HCC)    THERAPY DIAG:  Difficulty in walking, not elsewhere classified  Muscle weakness (generalized)  Rationale for Evaluation and Treatment: Rehabilitation  ONSET DATE: L AKA 11/04/22   SUBJECTIVE:   SUBJECTIVE STATEMENT: Pt is a 68 year old female s/p L AKA 11/04/22. Pt has received above-knee prosthesis for L. Pt had R BKA in Sept 2020 with prosthetic rehab in this clinic. Pt is proficient with R below-knee prosthesis.  PERTINENT HISTORY: Patient reports standing with her prosthesis at home with her sister and walking with it some at home. Pt reports dealing with phantom limb pain. Patient reports getting this in R BKA and L AKA limbs. Patient  reports Gabapentin helps to control phantom limb symptoms. Pt had R BKA 4-5 years ago. Pt reports some tenderness along end of L residual limb. Pt reports that she has not used her prosthesis much over previous 2 weeks. She reports using shrinker each day and completing regular checks on skin integrity for L residual limb.  Type of amputation: Above-knee amputation  Onset: L AKA 11/04/22 Follow-up appt with MD: Pt following up with Georgiana Spinner for check-up between May and June Pain: 710 Present, 10/10 Worst: (Pain along L hip flexor/L groin) Phantom pain: Yes History of previous amputations/PAD: Yes; Hx of R BKA  Pain quality: pain quality: sharp Numbness/Tingling: Yes Imaging: No    PRECAUTIONS: None  RED FLAGS: Increased urinary urgency and functional incontinence, unknown etiology Disturbed sleep lying on L side, modified with change in position  WEIGHT BEARING RESTRICTIONS: No  FALLS:  Has patient fallen in last 6 months? No  LIVING ENVIRONMENT: Lives with:  sister, great  granddaughter 66 y/o, significant other  Lives in: House/apartment; 3 steps in front to get into home; temporary living until pt finds home with flat entryway;  Shower chair in bathroom; pt able to get in/out of bathroom Stairs:  stairs into basement only, pt does not go there often Has following equipment at home: Dan Humphreys - 2 wheeled, Wheelchair (power), Wheelchair (manual), shower chair, and Grab bars; sliding board for W/C transfers  OCCUPATION: N/A  PLOF: Independent with household mobility with device  PATIENT GOALS: Get back to walking and improved independence with L AKA prosthesis  NEXT MD VISIT: Between May-June   OBJECTIVE:  Note: Objective measures were completed at Evaluation unless otherwise noted.  MUSCULOSKELETAL: Tremor: Absent Tone: Normal, no spasticity, rigidity, or clonus Residual limb appears well-healing with no signs of erythema, skin breakdown, infection, or  drainage  Posture Forward flexed posture in static standing  Palpation No pain to palpation along distal surface of residual limb.  No pain with palpation to quadriceps or hamstrings.    PATIENT SURVEYS:  ABC scale 6.3%  COGNITION: Overall cognitive status: Within functional limits for tasks assessed     SENSATION: Not tested   LOWER EXTREMITY ROM:  Active ROM Right eval Left eval  Hip flexion WNL WNL  Hip extension WNL -5  Hip abduction 40 40  Hip adduction    Hip internal rotation    Hip external rotation    Knee flexion    Knee extension    Ankle dorsiflexion    Ankle plantarflexion    Ankle inversion    Ankle eversion     (Blank rows = not tested)  LOWER EXTREMITY MMT:  MMT Right eval Left eval  Hip flexion 4+ 4  Hip extension 4 4  Hip abduction 4+ 4+  Hip adduction    Hip internal rotation    Hip external rotation    Knee flexion 4+   Knee extension 4+   Ankle dorsiflexion    Ankle plantarflexion    Ankle inversion    Ankle eversion     (Blank rows = not tested)   FUNCTIONAL TASKS:  W/C to bed transfer: CGA with sit to stand, pt presents with heavy forward lean and weight shift toward RLE, heavy upper limb push on FWW once pt has hands on walker. Heavy verbal cueing to push from seat versus walker for transferring.  Sit to stand: Difficulty initiating with slowed velocity of first 25% of transfer following lift from mat.   GAIT: Distance walked: 60 ft Assistive device utilized: Walker - 2 wheeled Level of assistance: CGA Comments: Heavy upper limb support on walker. Step-to pattern with RLE leading. Decreased terminal knee extension of L prosthesis at initial contact that can be partially corrected with verbal cueing for swing technique. Decreased LLE weight shift during stance phase. Decreased hip extension during mid-stance to terminal stance.  TREATMENT DATE: 06/28/2023    SUBJECTIVE STATEMENT:   Patient reports that she is doing well today. She has continued wearing her prosthesis and liner at home. No phantom limb pain at arrival. She continues working on her HEP with her sister.    Ther-ex  Seated marches with manual resistance from therapist 2 x 10 BLE; Seated clams with manual resistance from therapist 2 x 10; Seated adductor squeeze with manual resistance from therapist 2 x 10; Seated R LAQ with manual resistance 2 x 10; Seated R hamstring curls with manual resistance 2 x 10;   Therapeutic Activities - patient education, repetitive task practice for improved performance of daily functional activities e.g. transferring  Ambulate laps around gym with PT CGA and rolling W/C behind patient; 2 laps  -step-to pattern with LLE first, mild forward flexed posture Stand pivot transfer x 2 with PT CGA  -verbal cueing for hand placement  TUG performance: 2 min, 16 sec   Neuromuscular Re-education - postural control and desensitization/mirror therapy  Reviewed mirror therapy technique and provided handout for carryover of technique   Bilateral stance with emphasis on bilateral weightbearing without asymmetrical weight shift to RLE (BKA leg); x 30 sec Weight shifting with bilateral symmetrical stance; x 20 alternating R/L     PATIENT EDUCATION:  Education details: see above for patient education details Person educated: Patient Education method: Explanation, Demonstration, and Handouts Education comprehension: verbalized understanding and returned demonstration  HOME EXERCISE PROGRAM: Access Code: [email or web address] URL: [email or web address] Date: 06/23/2023 Prepared by: Consuela Mimes  Exercises - Prone Hip Flexor Stretch with Towel Roll (AKA)  - 2-3 x daily - 7 x weekly - 1 sets - 2-3 min hold - Supine Hip Abduction  - 2 x daily - 7 x weekly - 2 sets - 10 reps - Sidelying Hip  Abduction (AKA)  - 2 x daily - 7 x weekly - 2 sets - 10 reps - Prone Hip Extension with Residual Limb (AKA)  - 2 x daily - 7 x weekly - 2 sets - 10 reps  ASSESSMENT:  CLINICAL IMPRESSION: Patient is able to complete short-distance ambulation in gym with step-to pattern and no significant LOB, though she has notably slowed velocity and asymmetrical standing. We utilized mirror therapy to work on symmetrical weightbearing and weight shifting to each LE. Mirror therapy technique was reviewed for home to treat phantom limb pain. Patient presents with prosthetic gait deviations, hip weakness, imbalance, and difficulty with weight shifting/transfers. Pt will continue to benefit from skilled PT services to address deficits and improve function.  OBJECTIVE IMPAIRMENTS: Abnormal gait, decreased balance, decreased mobility, difficulty walking, decreased strength, prosthetic dependency , and pain.   ACTIVITY LIMITATIONS: carrying, standing, stairs, transfers, bed mobility, and locomotion level  PARTICIPATION LIMITATIONS: cleaning, laundry, shopping, and community activity  PERSONAL FACTORS: Age, Past/current experiences, and 3+ comorbidities: (anxiety, depression, CKD, COPD, DM, DKA, HTN lower extremity ischemia)  are also affecting patient's functional outcome.   REHAB POTENTIAL: Good  CLINICAL DECISION MAKING: Evolving/moderate complexity  EVALUATION COMPLEXITY: Moderate   GOALS: Goals reviewed with patient? Yes  SHORT TERM GOALS: Target date: 07/14/23 Patient will be independent and 100% compliant with HEP as needed for carryover of PT intervention Baseline: 06/21/23: Baseline HEP reviewed.  Goal status: INITIAL  2.  Patient will attain L hip hyperextension to 15 deg indicative of improved ROM required for terminal stance/heel off during gait Baseline: 06/21/23: L hip extension -5 Goal status: INITIAL  3.  Patient will demonstrate modified-independent sit to  stand and stand-pivot transfer from  W/C to adjacent sitting surface with no LOB and no verbal cueing required from therapist for technique Baseline: 06/21/23: Difficulty and some cueing needed for safety with transferring and stand-pivot transfer Goal status: INITIAL   LONG TERM GOALS: Target date: 09/01/23  Patient will ambulate at community-level distance with front-wheeled walker ModI with no LOB and normalized hip extension and swing through for L above-knee prosthesis Baseline: 06/21/23: Noted gait deviations and pt ambulating short of household distance with L AKA prosthesis Goal status: INITIAL  2.  Pt will improve ABC Scale by 13% or greater indicative of improved confidence related to balance and fall risk and clinically meaningful change Baseline: 06/21/23: ABC Scale to be obtained on visit # 2.   06/27/23: 6.3% Goal status: INITIAL  3.  Pt will perform TUG in less than 20 seconds indicative of decreased risk of falls relative to patient-specific population Baseline: 06/21/23: TUG to be completed on visit # 2.   06/27/23: 136 sec Goal status: INITIAL  4.  Pt will improve Amputee Mobility Predictor by 4 points or greater indicative of detectable change in ADLs and functional mobility with prostheses.  Baseline: 06/21/23: AMPPRO to be completed on visits #2-3.  Goal status: INITIAL  5.  Patient will negotiate 3 steps ModI with FWW as needed for entering/exiting her home. Baseline: 06/21/23: Significant difficulty with stair negotiation.  Goal status: INITIAL    PLAN:  PT FREQUENCY: 2x/week  PT DURATION: 10 weeks  PLANNED INTERVENTIONS: 97164- PT Re-evaluation, 97110-Therapeutic exercises, 97530- Therapeutic activity, [id number]- Neuromuscular re-education, [phone]- Self Care, 42706- Prosthetic training, Patient/Family education, and Balance training  PLAN FOR NEXT SESSION: Continue with weight shifting and gait training with new L AKA prosthesis, correcting gait deviations. Provide education and handout for phantom limb pain.  Open-chain strengthening for BLE.    Consuela Mimes, PT, DPT [phone]  Huprich,Jason, PT 06/28/2023, 12:58 PM

## 2023-06-28 NOTE — Therapy (Signed)
 OUTPATIENT PHYSICAL THERAPY TREATMENT    Patient Name: Sylvia [name] MRN: [id number] DOB:Mar 09, 1956, 68 y.o.,, female Today's Date: 06/28/2023  END OF SESSION:  PT End of Session - 06/28/23 1012     Visit Number 3    Number of Visits 21    Date for PT Re-Evaluation 09/01/23    Authorization Type MC[employer]aid    PT Start Time 1015    PT Stop Time 1100    PT Time Calculation (min) 45 min    Equipment Utilized During Treatment Gait belt   patient's personal FWW   Activity Tolerance Patient limited by fatigue;Patient tolerated treatment well   c/o bilat phantom limb pain   Behavior During Therapy WFL for tasks assessed/performed             Past Medical History:  Diagnosis Date   Anemia of chronic disease 05/16/2019   Anxiety    h/o   Arthritis    Chronic kidney disease    Cocaine abuse (HCC)    COPD (chronic obstructive pulmonary disease) (HCC)    Coronary artery disease    Depression    Diabetes mellitus without complication (HCC)    DKA (diabetic ketoacidosis) (HCC)    Elevated troponin    GERD (gastroesophageal reflux disease)    GI bleed    Hypertension    bp under control-off meds since 2019   Ischemic leg    Past Surgical History:  Procedure Laterality Date   ABDOMINAL HYSTERECTOMY     AMPUTATION Right 12/27/2018   Procedure: AMPUTATION BELOW KNEE;  Surgeon: Annice Needy, MD;  Location: [facility];  Service: General;  Laterality: Right;   AMPUTATION Left 11/05/2021   Procedure: AMPUTATION BELOW KNEE;  Surgeon: Annice Needy, MD;  Location: [facility];  Service: Vascular;  Laterality: Left;   AMPUTATION Left 11/04/2022   Procedure: AMPUTATION ABOVE KNEE;  Surgeon: Annice Needy, MD;  Location: [facility];  Service: Vascular;  Laterality: Left;   APPLICATION OF WOUND VAC  10/16/2021   Procedure: APPLICATION OF WOUND VAC;  Surgeon: Annice Needy, MD;  Location: [facility];  Service: Vascular;;   CATARACT EXTRACTION W/PHACO Right 03/27/2020   Procedure: CATARACT  EXTRACTION PHACO AND INTRAOCULAR LENS PLACEMENT (IOC) RIGHT DIABETIC 7.54 00:52.5;  Surgeon: Galen Manila, MD;  Location: [facility];  Service: Ophthalmology;  Laterality: Right;   CATARACT EXTRACTION W/PHACO Left 05/20/2020   Procedure: CATARACT EXTRACTION PHACO AND INTRAOCULAR LENS PLACEMENT (IOC) LEFT DIABETIC 4.95 00:37.6;  Surgeon: Galen Manila, MD;  Location: [facility];  Service: Ophthalmology;  Laterality: Left;  Diabetic - oral meds COVID + 04-24-20   CHOLECYSTECTOMY     COLONOSCOPY WITH PROPOFOL N/A 04/16/2019   Procedure: COLONOSCOPY WITH PROPOFOL;  Surgeon: Pasty Spillers, MD;  Location: [facility];  Service: Endoscopy;  Laterality: N/A;   COLONOSCOPY WITH PROPOFOL N/A 01/16/2020   Procedure: COLONOSCOPY WITH PROPOFOL;  Surgeon: Pasty Spillers, MD;  Location: [facility];  Service: Endoscopy;  Laterality: N/A;   ENDARTERECTOMY FEMORAL Left 10/16/2021   Procedure: ENDARTERECTOMY FEMORAL;  Surgeon: Annice Needy, MD;  Location: [facility];  Service: Vascular;  Laterality: Left;   ESOPHAGOGASTRODUODENOSCOPY (EGD) WITH PROPOFOL N/A 06/15/2018   Procedure: ESOPHAGOGASTRODUODENOSCOPY (EGD) WITH PROPOFOL;  Surgeon: Toledo, Boykin Nearing, MD;  Location: [facility];  Service: Gastroenterology;  Laterality: N/A;   ESOPHAGOGASTRODUODENOSCOPY (EGD) WITH PROPOFOL N/A 01/03/2019   Procedure: ESOPHAGOGASTRODUODENOSCOPY (EGD) WITH PROPOFOL;  Surgeon: Midge Minium, MD;  Location: [facility];  Service: Endoscopy;  Laterality:  N/A;   ESOPHAGOGASTRODUODENOSCOPY (EGD) WITH PROPOFOL N/A 09/25/2021   Procedure: ESOPHAGOGASTRODUODENOSCOPY (EGD) WITH PROPOFOL;  Surgeon: Midge Minium, MD;  Location: [facility];  Service: Endoscopy;  Laterality: N/A;   EYE SURGERY Bilateral    GIVENS CAPSULE STUDY  04/16/2019   Procedure: GIVENS CAPSULE STUDY;  Surgeon: Pasty Spillers, MD;  Location: [facility];  Service: Endoscopy;;   GIVENS CAPSULE STUDY N/A  06/25/2019   Procedure: GIVENS CAPSULE STUDY;  Surgeon: Wyline Mood, MD;  Location: [facility];  Service: Gastroenterology;  Laterality: N/A;   INCISION AND DRAINAGE OF WOUND Left 02/24/2022   Procedure: LEFT BKA IRRIGATION AND DEBRIDEMENT WOUND;  Surgeon: Annice Needy, MD;  Location: [facility];  Service: Vascular;  Laterality: Left;   LOWER EXTREMITY ANGIOGRAPHY Right 08/21/2018   Procedure: LOWER EXTREMITY ANGIOGRAPHY;  Surgeon: Annice Needy, MD;  Location: [facility];  Service: Cardiovascular;  Laterality: Right;   LOWER EXTREMITY ANGIOGRAPHY Left 08/28/2018   Procedure: LOWER EXTREMITY ANGIOGRAPHY;  Surgeon: Annice Needy, MD;  Location: [facility];  Service: Cardiovascular;  Laterality: Left;   LOWER EXTREMITY ANGIOGRAPHY Right 08/28/2018   Procedure: Lower Extremity Angiography;  Surgeon: Annice Needy, MD;  Location: [facility];  Service: Cardiovascular;  Laterality: Right;   LOWER EXTREMITY ANGIOGRAPHY Right 12/18/2018   Procedure: Lower Extremity Angiography;  Surgeon: Annice Needy, MD;  Location: [facility];  Service: Cardiovascular;  Laterality: Right;   LOWER EXTREMITY ANGIOGRAPHY Right 12/18/2018   Procedure: Lower Extremity Angiography;  Surgeon: Annice Needy, MD;  Location: [facility];  Service: Cardiovascular;  Laterality: Right;   LOWER EXTREMITY ANGIOGRAPHY Left 12/21/2018   Procedure: Lower Extremity Angiography;  Surgeon: Annice Needy, MD;  Location: [facility];  Service: Cardiovascular;  Laterality: Left;   LOWER EXTREMITY ANGIOGRAPHY Right 12/21/2018   Procedure: Lower Extremity Angiography;  Surgeon: Annice Needy, MD;  Location: [facility];  Service: Cardiovascular;  Laterality: Right;   LOWER EXTREMITY ANGIOGRAPHY Left 12/25/2020   Procedure: LOWER EXTREMITY ANGIOGRAPHY;  Surgeon: Annice Needy, MD;  Location: [facility];  Service: Cardiovascular;  Laterality: Left;   LOWER EXTREMITY  ANGIOGRAPHY Left 09/21/2021   Procedure: Lower Extremity Angiography;  Surgeon: Annice Needy, MD;  Location: [facility];  Service: Cardiovascular;  Laterality: Left;   LOWER EXTREMITY ANGIOGRAPHY Left 10/13/2021   Procedure: Lower Extremity Angiography;  Surgeon: Renford Dills, MD;  Location: [facility];  Service: Cardiovascular;  Laterality: Left;   LOWER EXTREMITY ANGIOGRAPHY Left 10/14/2021   Procedure: Lower Extremity Angiography;  Surgeon: Annice Needy, MD;  Location: [facility];  Service: Cardiovascular;  Laterality: Left;   LOWER EXTREMITY ANGIOGRAPHY Left 11/02/2021   Procedure: Lower Extremity Angiography;  Surgeon: Annice Needy, MD;  Location: [facility];  Service: Cardiovascular;  Laterality: Left;   LOWER EXTREMITY INTERVENTION N/A 12/22/2018   Procedure: LOWER EXTREMITY INTERVENTION;  Surgeon: Annice Needy, MD;  Location: [facility];  Service: Cardiovascular;  Laterality: N/A;   LOWER EXTREMITY INTERVENTION Left 12/26/2020   Procedure: LOWER EXTREMITY INTERVENTION;  Surgeon: Annice Needy, MD;  Location: [facility];  Service: Cardiovascular;  Laterality: Left;   LOWER EXTREMITY INTERVENTION N/A 09/22/2021   Procedure: LOWER EXTREMITY INTERVENTION;  Surgeon: Annice Needy, MD;  Location: [facility];  Service: Cardiovascular;  Laterality: N/A;   Patient Active Problem List  Diagnosis Date Noted   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) 11/04/2022   Amputation above knee (HCC) 11/04/2022   Cellulitis of left lower extremity 02/23/2022   Sepsis due to cellulitis (HCC) 02/23/2022   Dyslipidemia 02/23/2022   Rheumatoid arthritis (HCC) 02/23/2022   Essential hypertension 02/23/2022   Type 2 diabetes mellitus with peripheral neuropathy (HCC) 02/23/2022   Multiple duodenal ulcers    Ischemic leg 12/25/2020   Rheumatoid arthritis of 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)  08/13/2020   Diabetes mellitus without complication (HCC)    GERD (gastroesophageal reflux disease)    COPD (chronic obstructive pulmonary disease) (HCC)    GIB (gastrointestinal bleeding)    HLD (hyperlipidemia)    Depression    Elevated troponin    Syncope    Elevated troponin I level    Anemia of chronic disease 05/16/2019   Dizziness    Polyp of colon    Acute upper GI bleeding 04/13/2019   Anemia associated with acute blood loss 04/13/2019   Chronic anticoagulation 04/13/2019   Hx of right BKA (HCC) 04/13/2019   Symptomatic anemia 04/13/2019   Upper GI bleed 04/13/2019   Acute GI bleeding 04/13/2019   Phantom pain after amputation of lower extremity (HCC) 01/30/2019   Melena    DU (duodenal ulcer)    DKA (diabetic ketoacidosis) (HCC) 12/14/2018   Atherosclerosis of artery of extremity with rest pain (HCC) 08/28/2018   PAD (peripheral artery disease) (HCC) 07/14/2018   Abdominal pain 06/13/2018   HTN (hypertension) 06/13/2018   Non-insulin dependent type 2 diabetes mellitus (HCC) 06/13/2018   Elevated lactic acid level 06/13/2018    [facility]  REFERRING PROVIDER: Georgiana Spinner, NP  REFERRING DIAG:  [phone] (ICD-10-CM) - Hx of right BKA (HCC)  Z89.512 (ICD-10-CM) - Hx of BKA, left (HCC)    THERAPY DIAG:  Difficulty in walking, not elsewhere classified  Muscle weakness (generalized)  Rationale for Evaluation and Treatment: Rehabilitation  ONSET DATE: L AKA 11/04/22   SUBJECTIVE:   SUBJECTIVE STATEMENT: Pt is a 68 year old female s/p L AKA 11/04/22. Pt has received above-knee prosthesis for L. Pt had R BKA in Sept 2020 with prosthetic rehab in this clinic. Pt is proficient with R below-knee prosthesis.  PERTINENT HISTORY: Patient reports standing with her prosthesis at home with her sister and walking with it some at home. Pt reports dealing with phantom limb pain. Patient reports getting this in R BKA and L AKA limbs. Patient  reports Gabapentin helps to control phantom limb symptoms. Pt had R BKA 4-5 years ago. Pt reports some tenderness along end of L residual limb. Pt reports that she has not used her prosthesis much over previous 2 weeks. She reports using shrinker each day and completing regular checks on skin integrity for L residual limb.  Type of amputation: Above-knee amputation  Onset: L AKA 11/04/22 Follow-up appt with MD: Pt following up with Georgiana Spinner for check-up between May and June Pain: 710 Present, 10/10 Worst: (Pain along L hip flexor/L groin) Phantom pain: Yes History of previous amputations/PAD: Yes; Hx of R BKA  Pain quality: pain quality: sharp Numbness/Tingling: Yes Imaging: No    PRECAUTIONS: None  RED FLAGS: Increased urinary urgency and functional incontinence, unknown etiology Disturbed sleep lying on L side, modified with change in position  WEIGHT BEARING RESTRICTIONS: No  FALLS:  Has patient fallen in last 6 months? No  LIVING ENVIRONMENT: Lives with:  sister, great  granddaughter 24 y/o, significant other  Lives in: House/apartment; 3 steps in front to get into home; temporary living until pt finds home with flat entryway;  Shower chair in bathroom; pt able to get in/out of bathroom Stairs:  stairs into basement only, pt does not go there often Has following equipment at home: Dan Humphreys - 2 wheeled, Wheelchair (power), Wheelchair (manual), shower chair, and Grab bars; sliding board for W/C transfers  OCCUPATION: N/A  PLOF: Independent with household mobility with device  PATIENT GOALS: Get back to walking and improved independence with L AKA prosthesis  NEXT MD VISIT: Between May-June   OBJECTIVE:  Note: Objective measures were completed at Evaluation unless otherwise noted.  MUSCULOSKELETAL: Tremor: Absent Tone: Normal, no spasticity, rigidity, or clonus Residual limb appears well-healing with no signs of erythema, skin breakdown, infection, or  drainage  Posture Forward flexed posture in static standing  Palpation No pain to palpation along distal surface of residual limb.  No pain with palpation to quadriceps or hamstrings.    PATIENT SURVEYS:  ABC scale 6.3%  COGNITION: Overall cognitive status: Within functional limits for tasks assessed     SENSATION: Not tested   LOWER EXTREMITY ROM:  Active ROM Right eval Left eval  Hip flexion WNL WNL  Hip extension WNL -5  Hip abduction 40 40  Hip adduction    Hip internal rotation    Hip external rotation    Knee flexion    Knee extension    Ankle dorsiflexion    Ankle plantarflexion    Ankle inversion    Ankle eversion     (Blank rows = not tested)  LOWER EXTREMITY MMT:  MMT Right eval Left eval  Hip flexion 4+ 4  Hip extension 4 4  Hip abduction 4+ 4+  Hip adduction    Hip internal rotation    Hip external rotation    Knee flexion 4+   Knee extension 4+   Ankle dorsiflexion    Ankle plantarflexion    Ankle inversion    Ankle eversion     (Blank rows = not tested)   FUNCTIONAL TASKS:  W/C to bed transfer: CGA with sit to stand, pt presents with heavy forward lean and weight shift toward RLE, heavy upper limb push on FWW once pt has hands on walker. Heavy verbal cueing to push from seat versus walker for transferring.  Sit to stand: Difficulty initiating with slowed velocity of first 25% of transfer following lift from mat.   GAIT: Distance walked: 60 ft Assistive device utilized: Walker - 2 wheeled Level of assistance: CGA Comments: Heavy upper limb support on walker. Step-to pattern with RLE leading. Decreased terminal knee extension of L prosthesis at initial contact that can be partially corrected with verbal cueing for swing technique. Decreased LLE weight shift during stance phase. Decreased hip extension during mid-stance to terminal stance.  TREATMENT DATE: 06/28/2023    SUBJECTIVE STATEMENT:   Patient reports soreness after physical therapy yesterday. Soreness noted especially in arms and shoulders. She has continued wearing her prosthesis and liner at home. Patient reports no phantom limb pain at arrival but ongoing chronic low back pain. She continues working on HEP with her sister.    Ther-ex  Seated marches with manual resistance from therapist 2 x 10 BLE; Seated clams with manual resistance from therapist 2 x 10; Seated adductor squeeze with manual resistance from therapist 2 x 10; Seated R LAQ with manual resistance 2 x 10; Seated R hamstring curls with manual resistance 2 x 10;   Therapeutic Activities - patient education, repetitive task practice for improved performance of daily functional activities e.g. transferring  Gait in rehab gym and hallway x 48', x 72'. Pt utilizes step-to pattern with LLE first after therapist provides cues. Mild forward flexed posture. She requires intermittent cues for L heel strike and to ensure that LLE prosthesis is locked before shifting weight. Also requires cues to take more weight through LLE and to relax BUE support on walker;  Stand pivot transfer x 2 with PT CGA to/from mat table. Verbal cueing for hand placement and turning on prosthesis; Bilateral stance in // bars without UE support with emphasis on bilateral weightbearing without asymmetrical weight shift to RLE (BKA leg) x 60 sec Weight shifting with bilateral symmetrical stance and no UE support alternating R/L   PATIENT EDUCATION:  Education details: see above for patient education details Person educated: Patient Education method: Explanation, Demonstration, and Handouts Education comprehension: verbalized understanding and returned demonstration  HOME EXERCISE PROGRAM: Access Code: [email or web address] URL: [email or web address] Date: 06/23/2023 Prepared by: Consuela Mimes  Exercises - Prone Hip Flexor Stretch with Towel Roll (AKA)  - 2-3 x daily - 7 x weekly - 1 sets - 2-3 min hold - Supine Hip Abduction  - 2 x daily - 7 x weekly - 2 sets - 10 reps - Sidelying Hip Abduction (AKA)  - 2 x daily - 7 x weekly - 2 sets - 10 reps - Prone Hip Extension with Residual Limb (AKA)  - 2 x daily - 7 x weekly - 2 sets - 10 reps  ASSESSMENT:  CLINICAL IMPRESSION: Patient is able to increase her ambulation today with no significant LOB. She continues to struggle with excessive WB through BUE. She requires intermittent cues during gait to ensure LLE is locked prior to weight shift. We utilized mirror therapy again today to work on symmetrical weightbearing and weight shifting to each LE. Patient presents with prosthetic gait deviations, hip weakness, imbalance, and difficulty with weight shifting/transfers. Pt will continue to benefit from skilled PT services to address deficits and improve function.  OBJECTIVE IMPAIRMENTS: Abnormal gait, decreased balance, decreased mobility, difficulty walking, decreased strength, prosthetic dependency , and pain.   ACTIVITY LIMITATIONS: carrying, standing, stairs, transfers, bed mobility, and locomotion level  PARTICIPATION LIMITATIONS: cleaning, laundry, shopping, and community activity  PERSONAL FACTORS: Age, Past/current experiences, and 3+ comorbidities: (anxiety, depression, CKD, COPD, DM, DKA, HTN lower extremity ischemia)  are also affecting patient's functional outcome.   REHAB POTENTIAL: Good  CLINICAL DECISION MAKING: Evolving/moderate complexity  EVALUATION COMPLEXITY: Moderate   GOALS: Goals reviewed with patient? Yes  SHORT TERM GOALS: Target date: 07/14/23 Patient will be independent and 100% compliant with HEP as needed for carryover of PT intervention Baseline: 06/21/23: Baseline HEP reviewed.  Goal status: INITIAL  2.  Patient will attain L hip hyperextension  to 15 deg indicative of improved ROM required  for terminal stance/heel off during gait Baseline: 06/21/23: L hip extension -5 Goal status: INITIAL  3.  Patient will demonstrate modified-independent sit to stand and stand-pivot transfer from W/C to adjacent sitting surface with no LOB and no verbal cueing required from therapist for technique Baseline: 06/21/23: Difficulty and some cueing needed for safety with transferring and stand-pivot transfer Goal status: INITIAL   LONG TERM GOALS: Target date: 09/01/23  Patient will ambulate at community-level distance with front-wheeled walker ModI with no LOB and normalized hip extension and swing through for L above-knee prosthesis Baseline: 06/21/23: Noted gait deviations and pt ambulating short of household distance with L AKA prosthesis Goal status: INITIAL  2.  Pt will improve ABC Scale by 13% or greater indicative of improved confidence related to balance and fall risk and clinically meaningful change Baseline: 06/21/23: ABC Scale to be obtained on visit # 2.   06/27/23: 6.3% Goal status: INITIAL  3.  Pt will perform TUG in less than 20 seconds indicative of decreased risk of falls relative to patient-specific population Baseline: 06/21/23: TUG to be completed on visit # 2.   06/27/23: 136 sec Goal status: INITIAL  4.  Pt will improve Amputee Mobility Predictor by 4 points or greater indicative of detectable change in ADLs and functional mobility with prostheses.  Baseline: 06/21/23: AMPPRO to be completed on visits #2-3.  Goal status: INITIAL  5.  Patient will negotiate 3 steps ModI with FWW as needed for entering/exiting her home. Baseline: 06/21/23: Significant difficulty with stair negotiation.  Goal status: INITIAL    PLAN:  PT FREQUENCY: 2x/week  PT DURATION: 10 weeks  PLANNED INTERVENTIONS: 97164- PT Re-evaluation, 97110-Therapeutic exercises, 97530- Therapeutic activity, [id number]- Neuromuscular re-education, [phone]- Self Care, [id number]- Prosthetic training, Patient/Family education, and  Balance training  PLAN FOR NEXT SESSION: Continue with weight shifting and gait training with new L AKA prosthesis, correcting gait deviations. Provide education and handout for phantom limb pain. Open-chain strengthening for BLE.    Sharalyn Ink Noach Calvillo PT, DPT, GCS  Kaile Bixler, PT 06/28/2023, 3:14 PM

## 2023-06-28 NOTE — Therapy (Addendum)
 OUTPATIENT PHYSICAL THERAPY AKA TREATMENT    Patient Name: Sylvia [name] MRN: [id number] DOB:[birth year], 68 y.o., female Today's Date: 06/28/2023  END OF SESSION:  PT End of Session - 06/27/23 0958     Visit Number 2    Number of Visits 21    Date for PT Re-Evaluation 09/01/23    Authorization Type [employer]    PT Start Time 0955    PT Stop Time 1037    PT Time Calculation (min) 42 min    Equipment Utilized During Treatment Gait belt   patient's personal FWW   Activity Tolerance Patient limited by fatigue;Patient tolerated treatment well   c/o bilat phantom limb pain   Behavior During Therapy WFL for tasks assessed/performed             Past Medical History:  Diagnosis Date   Anemia of chronic disease 05/16/2019   Anxiety    h/o   Arthritis    Chronic kidney disease    Cocaine abuse (HCC)    COPD (chronic obstructive pulmonary disease) (HCC)    Coronary artery disease    Depression    Diabetes mellitus without complication (HCC)    DKA (diabetic ketoacidosis) (HCC)    Elevated troponin    GERD (gastroesophageal reflux disease)    GI bleed    Hypertension    bp under control-off meds since 2019   Ischemic leg    Past Surgical History:  Procedure Laterality Date   ABDOMINAL HYSTERECTOMY     AMPUTATION Right 12/27/2018   Procedure: AMPUTATION BELOW KNEE;  Surgeon: Annice Needy, MD;  Location: [facility];  Service: General;  Laterality: Right;   AMPUTATION Left 11/05/2021   Procedure: AMPUTATION BELOW KNEE;  Surgeon: Annice Needy, MD;  Location: [facility];  Service: Vascular;  Laterality: Left;   AMPUTATION Left 11/04/2022   Procedure: AMPUTATION ABOVE KNEE;  Surgeon: Annice Needy, MD;  Location: [facility];  Service: Vascular;  Laterality: Left;   APPLICATION OF WOUND VAC  10/16/2021   Procedure: APPLICATION OF WOUND VAC;  Surgeon: Annice Needy, MD;  Location: [facility];  Service: Vascular;;   CATARACT EXTRACTION W/PHACO Right 03/27/2020   Procedure: CATARACT  EXTRACTION PHACO AND INTRAOCULAR LENS PLACEMENT (IOC) RIGHT DIABETIC 7.54 00:52.5;  Surgeon: Galen Manila, MD;  Location: [facility];  Service: Ophthalmology;  Laterality: Right;   CATARACT EXTRACTION W/PHACO Left 05/20/2020   Procedure: CATARACT EXTRACTION PHACO AND INTRAOCULAR LENS PLACEMENT (IOC) LEFT DIABETIC 4.95 00:37.6;  Surgeon: Galen Manila, MD;  Location: [facility];  Service: Ophthalmology;  Laterality: Left;  Diabetic - oral meds COVID + 04-24-20   CHOLECYSTECTOMY     COLONOSCOPY WITH PROPOFOL N/A 04/16/2019   Procedure: COLONOSCOPY WITH PROPOFOL;  Surgeon: Pasty Spillers, MD;  Location: [facility];  Service: Endoscopy;  Laterality: N/A;   COLONOSCOPY WITH PROPOFOL N/A 01/16/2020   Procedure: COLONOSCOPY WITH PROPOFOL;  Surgeon: Pasty Spillers, MD;  Location: [facility];  Service: Endoscopy;  Laterality: N/A;   ENDARTERECTOMY FEMORAL Left 10/16/2021   Procedure: ENDARTERECTOMY FEMORAL;  Surgeon: Annice Needy, MD;  Location: [facility];  Service: Vascular;  Laterality: Left;   ESOPHAGOGASTRODUODENOSCOPY (EGD) WITH PROPOFOL N/A 06/15/2018   Procedure: ESOPHAGOGASTRODUODENOSCOPY (EGD) WITH PROPOFOL;  Surgeon: Toledo, Boykin Nearing, MD;  Location: [facility];  Service: Gastroenterology;  Laterality: N/A;   ESOPHAGOGASTRODUODENOSCOPY (EGD) WITH PROPOFOL N/A 01/03/2019   Procedure: ESOPHAGOGASTRODUODENOSCOPY (EGD) WITH PROPOFOL;  Surgeon: Midge Minium, MD;  Location: [facility];  Service: Endoscopy;  Laterality: N/A;   ESOPHAGOGASTRODUODENOSCOPY (EGD) WITH PROPOFOL N/A 09/25/2021   Procedure: ESOPHAGOGASTRODUODENOSCOPY (EGD) WITH PROPOFOL;  Surgeon: Midge Minium, MD;  Location: [facility];  Service: Endoscopy;  Laterality: N/A;   EYE SURGERY Bilateral    GIVENS CAPSULE STUDY  04/16/2019   Procedure: GIVENS CAPSULE STUDY;  Surgeon: Pasty Spillers, MD;  Location: [facility];  Service: Endoscopy;;   GIVENS CAPSULE STUDY N/A  06/25/2019   Procedure: GIVENS CAPSULE STUDY;  Surgeon: Wyline Mood, MD;  Location: [facility];  Service: Gastroenterology;  Laterality: N/A;   INCISION AND DRAINAGE OF WOUND Left 02/24/2022   Procedure: LEFT BKA IRRIGATION AND DEBRIDEMENT WOUND;  Surgeon: Annice Needy, MD;  Location: [facility];  Service: Vascular;  Laterality: Left;   LOWER EXTREMITY ANGIOGRAPHY Right 08/21/2018   Procedure: LOWER EXTREMITY ANGIOGRAPHY;  Surgeon: Annice Needy, MD;  Location: [facility];  Service: Cardiovascular;  Laterality: Right;   LOWER EXTREMITY ANGIOGRAPHY Left 08/28/2018   Procedure: LOWER EXTREMITY ANGIOGRAPHY;  Surgeon: Annice Needy, MD;  Location: [facility];  Service: Cardiovascular;  Laterality: Left;   LOWER EXTREMITY ANGIOGRAPHY Right 08/28/2018   Procedure: Lower Extremity Angiography;  Surgeon: Annice Needy, MD;  Location: [facility];  Service: Cardiovascular;  Laterality: Right;   LOWER EXTREMITY ANGIOGRAPHY Right 12/18/2018   Procedure: Lower Extremity Angiography;  Surgeon: Annice Needy, MD;  Location: [facility];  Service: Cardiovascular;  Laterality: Right;   LOWER EXTREMITY ANGIOGRAPHY Right 12/18/2018   Procedure: Lower Extremity Angiography;  Surgeon: Annice Needy, MD;  Location: [facility];  Service: Cardiovascular;  Laterality: Right;   LOWER EXTREMITY ANGIOGRAPHY Left 12/21/2018   Procedure: Lower Extremity Angiography;  Surgeon: Annice Needy, MD;  Location: [facility];  Service: Cardiovascular;  Laterality: Left;   LOWER EXTREMITY ANGIOGRAPHY Right 12/21/2018   Procedure: Lower Extremity Angiography;  Surgeon: Annice Needy, MD;  Location: [facility];  Service: Cardiovascular;  Laterality: Right;   LOWER EXTREMITY ANGIOGRAPHY Left 12/25/2020   Procedure: LOWER EXTREMITY ANGIOGRAPHY;  Surgeon: Annice Needy, MD;  Location: [facility];  Service: Cardiovascular;  Laterality: Left;   LOWER EXTREMITY  ANGIOGRAPHY Left 09/21/2021   Procedure: Lower Extremity Angiography;  Surgeon: Annice Needy, MD;  Location: [facility];  Service: Cardiovascular;  Laterality: Left;   LOWER EXTREMITY ANGIOGRAPHY Left 10/13/2021   Procedure: Lower Extremity Angiography;  Surgeon: Renford Dills, MD;  Location: [facility];  Service: Cardiovascular;  Laterality: Left;   LOWER EXTREMITY ANGIOGRAPHY Left 10/14/2021   Procedure: Lower Extremity Angiography;  Surgeon: Annice Needy, MD;  Location: [facility];  Service: Cardiovascular;  Laterality: Left;   LOWER EXTREMITY ANGIOGRAPHY Left 11/02/2021   Procedure: Lower Extremity Angiography;  Surgeon: Annice Needy, MD;  Location: [facility];  Service: Cardiovascular;  Laterality: Left;   LOWER EXTREMITY INTERVENTION N/A 12/22/2018   Procedure: LOWER EXTREMITY INTERVENTION;  Surgeon: Annice Needy, MD;  Location: [facility];  Service: Cardiovascular;  Laterality: N/A;   LOWER EXTREMITY INTERVENTION Left 12/26/2020   Procedure: LOWER EXTREMITY INTERVENTION;  Surgeon: Annice Needy, MD;  Location: [facility];  Service: Cardiovascular;  Laterality: Left;   LOWER EXTREMITY INTERVENTION N/A 09/22/2021   Procedure: LOWER EXTREMITY INTERVENTION;  Surgeon: Annice Needy, MD;  Location: [facility];  Service: Cardiovascular;  Laterality: N/A;   Patient Active Problem List  Diagnosis Date Noted   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) 11/04/2022   Amputation above knee (HCC) 11/04/2022   Cellulitis of left lower extremity 02/23/2022   Sepsis due to cellulitis (HCC) 02/23/2022   Dyslipidemia 02/23/2022   Rheumatoid arthritis (HCC) 02/23/2022   Essential hypertension 02/23/2022   Type 2 diabetes mellitus with peripheral neuropathy (HCC) 02/23/2022   Multiple duodenal ulcers    Ischemic leg 12/25/2020   Rheumatoid arthritis of 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)  08/13/2020   Diabetes mellitus without complication (HCC)    GERD (gastroesophageal reflux disease)    COPD (chronic obstructive pulmonary disease) (HCC)    GIB (gastrointestinal bleeding)    HLD (hyperlipidemia)    Depression    Elevated troponin    Syncope    Elevated troponin I level    Anemia of chronic disease 05/16/2019   Dizziness    Polyp of colon    Acute upper GI bleeding 04/13/2019   Anemia associated with acute blood loss 04/13/2019   Chronic anticoagulation 04/13/2019   Hx of right BKA (HCC) 04/13/2019   Symptomatic anemia 04/13/2019   Upper GI bleed 04/13/2019   Acute GI bleeding 04/13/2019   Phantom pain after amputation of lower extremity (HCC) 01/30/2019   Melena    DU (duodenal ulcer)    DKA (diabetic ketoacidosis) (HCC) 12/14/2018   Atherosclerosis of artery of extremity with rest pain (HCC) 08/28/2018   PAD (peripheral artery disease) (HCC) 07/14/2018   Abdominal pain 06/13/2018   HTN (hypertension) 06/13/2018   Non-insulin dependent type 2 diabetes mellitus (HCC) 06/13/2018   Elevated lactic acid level 06/13/2018     PCP: [facility]   REFERRING PROVIDER: Georgiana Spinner, NP   REFERRING DIAG:  [phone] (ICD-10-CM) - Hx of right BKA (HCC)  Z89.512 (ICD-10-CM) - Hx of BKA, left (HCC)      THERAPY DIAG:  Difficulty in walking, not elsewhere classified   Muscle weakness (generalized)   Imbalance   History of amputation below knee, right (HCC)   History of above knee amputation, left (HCC)   Rationale for Evaluation and Treatment: Rehabilitation   ONSET DATE: L AKA 11/04/22    SUBJECTIVE:    SUBJECTIVE STATEMENT: Pt is a 68 year old female s/p L AKA 11/04/22. Pt has received above-knee prosthesis for L. Pt had R BKA in Sept 2020 with prosthetic rehab in this clinic. Pt is proficient with R below-knee prosthesis.   PERTINENT HISTORY: Patient reports standing with her prosthesis at home with her sister and walking with  it some at home. Pt reports dealing with phantom limb pain. Patient reports getting this in R BKA and L AKA limbs. Patient reports Gabapentin helps to control phantom limb symptoms. Pt had R BKA 4-5 years ago. Pt reports some tenderness along end of L residual limb. Pt reports that she has not used her prosthesis much over previous 2 weeks. She reports using shrinker each day and completing regular checks on skin integrity for L residual limb.  Type of amputation: Above-knee amputation  Onset: L AKA 11/04/22 Follow-up appt with MD: Pt following up with Georgiana Spinner for check-up between May and June Pain: 710 Present, 10/10 Worst: (Pain along L hip flexor/L groin) Phantom pain: Yes History of previous amputations/PAD: Yes; Hx of R BKA  Pain quality: pain quality: sharp Numbness/Tingling: Yes Imaging: No      PRECAUTIONS: None   RED FLAGS: Increased urinary urgency and functional incontinence, unknown etiology  Disturbed sleep lying on L side, modified with change in position   WEIGHT BEARING RESTRICTIONS: No   FALLS:  Has patient fallen in last 6 months? No   LIVING ENVIRONMENT: Lives with:  sister, great granddaughter 7 y/o, significant other  Lives in: House/apartment; 3 steps in front to get into home; temporary living until pt finds home with flat entryway;  Shower chair in bathroom; pt able to get in/out of bathroom Stairs:  stairs into basement only, pt does not go there often Has following equipment at home: Dan Humphreys - 2 wheeled, Wheelchair (power), Wheelchair (manual), shower chair, and Grab bars; sliding board for W/C transfers   OCCUPATION: N/A   PLOF: Independent with household mobility with device   PATIENT GOALS: Get back to walking and improved independence with L AKA prosthesis   NEXT MD VISIT: Between May-June     OBJECTIVE:  Note: Objective measures were completed at Evaluation unless otherwise noted.   MUSCULOSKELETAL: Tremor: Absent Tone: Normal, no  spasticity, rigidity, or clonus Residual limb appears well-healing with no signs of erythema, skin breakdown, infection, or drainage   Posture Forward flexed posture in static standing   Palpation No pain to palpation along distal surface of residual limb.  No pain with palpation to quadriceps or hamstrings.      PATIENT SURVEYS:  ABC scale 6.3%   COGNITION: Overall cognitive status: Within functional limits for tasks assessed                                    SENSATION: Not tested     LOWER EXTREMITY ROM:   Active ROM Right eval Left eval  Hip flexion WNL WNL  Hip extension WNL -5  Hip abduction 40 40  Hip adduction      Hip internal rotation      Hip external rotation      Knee flexion      Knee extension      Ankle dorsiflexion      Ankle plantarflexion      Ankle inversion      Ankle eversion       (Blank rows = not tested)   LOWER EXTREMITY MMT:   MMT Right eval Left eval  Hip flexion 4+ 4  Hip extension 4 4  Hip abduction 4+ 4+  Hip adduction      Hip internal rotation      Hip external rotation      Knee flexion 4+    Knee extension 4+    Ankle dorsiflexion      Ankle plantarflexion      Ankle inversion      Ankle eversion       (Blank rows = not tested)     FUNCTIONAL TASKS:  W/C to bed transfer: CGA with sit to stand, pt presents with heavy forward lean and weight shift toward RLE, heavy upper limb push on FWW once pt has hands on walker. Heavy verbal cueing to push from seat versus walker for transferring.  Sit to stand: Difficulty initiating with slowed velocity of first 25% of transfer following lift from mat.    GAIT: Distance walked: 60 ft Assistive device utilized: Walker - 2 wheeled Level of assistance: CGA Comments: Heavy upper limb support on walker. Step-to pattern with RLE leading. Decreased terminal knee extension of L prosthesis at initial contact that can be partially corrected with verbal cueing for swing technique.  Decreased  LLE weight shift during stance phase. Decreased hip extension during mid-stance to terminal stance.                                                                                                                                   TREATMENT DATE: 06/27/2023       SUBJECTIVE STATEMENT:   Patient reports spending about 2 hours in bilat prostheses yesterday. Patient reports no phantom limb pain at arrival. Patient reports doing okay with initial exercises. Patient reports working on HEP with her sister.    Completion of ABC Scale     Therapeutic Activities - patient education, repetitive task practice for improved performance of daily functional activities e.g. transferring   Ambulate laps around gym with PT CGA and rolling W/C behind patient; 2 laps             -step-to pattern with LLE first, mild forward flexed posture Stand pivot transfer x 2 with PT CGA             -verbal cueing for hand placement   TUG performance: 2 min, 16 sec     Neuromuscular Re-education - postural control and desensitization/mirror therapy   Reviewed mirror therapy technique and provided handout for carryover of technique    Bilateral stance with emphasis on bilateral weightbearing without asymmetrical weight shift to RLE (BKA leg); x 30 sec Weight shifting with bilateral symmetrical stance; x 20 alternating R/L         PATIENT EDUCATION:  Education details: see above for patient education details Person educated: Patient Education method: Explanation, Demonstration, and Handouts Education comprehension: verbalized understanding and returned demonstration   HOME EXERCISE PROGRAM: Access Code: [email or web address] URL: [email or web address] Date: 06/23/2023 Prepared by: Consuela Mimes   Exercises - Prone Hip Flexor Stretch with Towel Roll (AKA)  - 2-3 x daily - 7 x weekly - 1 sets - 2-3 min hold - Supine Hip Abduction  - 2 x daily - 7 x weekly - 2 sets - 10 reps - Sidelying Hip Abduction  (AKA)  - 2 x daily - 7 x weekly - 2 sets - 10 reps - Prone Hip Extension with Residual Limb (AKA)  - 2 x daily - 7 x weekly - 2 sets - 10 reps   ASSESSMENT:   CLINICAL IMPRESSION: Patient is able to complete short-distance ambulation in gym with step-to pattern and no significant LOB, though she has notably slowed velocity and asymmetrical standing. We utilized mirror therapy to work on symmetrical weightbearing and weight shifting to each LE. Mirror therapy technique was reviewed for home to treat phantom limb pain. Patient presents with prosthetic gait deviations, hip weakness, imbalance, and difficulty with weight shifting/transfers. Pt will continue to benefit from skilled PT services to address deficits and improve function.   OBJECTIVE IMPAIRMENTS: Abnormal gait, decreased balance, decreased mobility, difficulty walking, decreased strength, prosthetic dependency , and pain.    ACTIVITY LIMITATIONS:  carrying, standing, stairs, transfers, bed mobility, and locomotion level   PARTICIPATION LIMITATIONS: cleaning, laundry, shopping, and community activity   PERSONAL FACTORS: Age, Past/current experiences, and 3+ comorbidities: (anxiety, depression, CKD, COPD, DM, DKA, HTN lower extremity ischemia)  are also affecting patient's functional outcome.    REHAB POTENTIAL: Good   CLINICAL DECISION MAKING: Evolving/moderate complexity   EVALUATION COMPLEXITY: Moderate     GOALS: Goals reviewed with patient? Yes   SHORT TERM GOALS: Target date: 07/14/23 Patient will be independent and 100% compliant with HEP as needed for carryover of PT intervention Baseline: 06/21/23: Baseline HEP reviewed.  Goal status: INITIAL   2.  Patient will attain L hip hyperextension to 15 deg indicative of improved ROM required for terminal stance/heel off during gait Baseline: 06/21/23: L hip extension -5 Goal status: INITIAL   3.  Patient will demonstrate modified-independent sit to stand and stand-pivot transfer  from W/C to adjacent sitting surface with no LOB and no verbal cueing required from therapist for technique Baseline: 06/21/23: Difficulty and some cueing needed for safety with transferring and stand-pivot transfer Goal status: INITIAL     LONG TERM GOALS: Target date: 09/01/23   Patient will ambulate at community-level distance with front-wheeled walker ModI with no LOB and normalized hip extension and swing through for L above-knee prosthesis Baseline: 06/21/23: Noted gait deviations and pt ambulating short of household distance with L AKA prosthesis Goal status: INITIAL   2.  Pt will improve ABC Scale by 13% or greater indicative of improved confidence related to balance and fall risk and clinically meaningful change Baseline: 06/21/23: ABC Scale to be obtained on visit # 2.   06/27/23: 6.3% Goal status: INITIAL   3.  Pt will perform TUG in less than 20 seconds indicative of decreased risk of falls relative to patient-specific population Baseline: 06/21/23: TUG to be completed on visit # 2.   06/27/23: 136 sec Goal status: INITIAL   4.  Pt will improve Amputee Mobility Predictor by 4 points or greater indicative of detectable change in ADLs and functional mobility with prostheses.  Baseline: 06/21/23: AMPPRO to be completed on visits #2-3.  Goal status: INITIAL   5.  Patient will negotiate 3 steps ModI with FWW as needed for entering/exiting her home. Baseline: 06/21/23: Significant difficulty with stair negotiation.  Goal status: INITIAL       PLAN:   PT FREQUENCY: 2x/week   PT DURATION: 10 weeks   PLANNED INTERVENTIONS: 97164- PT Re-evaluation, 97110-Therapeutic exercises, 97530- Therapeutic activity, [id number]- Neuromuscular re-education, [phone]- Self Care, [id number]- Prosthetic training, Patient/Family education, and Balance training   PLAN FOR NEXT SESSION: Continue with weight shifting and gait training with new L AKA prosthesis, correcting gait deviations. Provide education and handout for  phantom limb pain. Open-chain strengthening for BLE.   Consuela Mimes, PT, DPT #[id number]  Gertie Exon, PT 06/28/2023, 1:19 PM

## 2023-07-04 ENCOUNTER — Ambulatory Visit: Payer: 59 | Admitting: Physical Therapy

## 2023-07-06 ENCOUNTER — Encounter: Payer: Self-pay | Admitting: Physical Therapy

## 2023-07-06 ENCOUNTER — Ambulatory Visit: Payer: 59 | Admitting: Physical Therapy

## 2023-07-06 DIAGNOSIS — M6281 Muscle weakness (generalized): Secondary | ICD-10-CM

## 2023-07-06 DIAGNOSIS — Z89511 Acquired absence of right leg below knee: Secondary | ICD-10-CM

## 2023-07-06 DIAGNOSIS — Z89612 Acquired absence of left leg above knee: Secondary | ICD-10-CM

## 2023-07-06 DIAGNOSIS — R2689 Other abnormalities of gait and mobility: Secondary | ICD-10-CM

## 2023-07-06 DIAGNOSIS — R262 Difficulty in walking, not elsewhere classified: Secondary | ICD-10-CM | POA: Diagnosis not present

## 2023-07-06 NOTE — Therapy (Deleted)
 OUTPATIENT PHYSICAL THERAPY TREATMENT    Patient Name: Sylvia [name] MRN: [id number] DOB:[birth year], 68 y.o., female Today's Date: 07/06/2023  END OF SESSION:    Past Medical History:  Diagnosis Date   Anemia of chronic disease 05/16/2019   Anxiety    h/o   Arthritis    Chronic kidney disease    Cocaine abuse (HCC)    COPD (chronic obstructive pulmonary disease) (HCC)    Coronary artery disease    Depression    Diabetes mellitus without complication (HCC)    DKA (diabetic ketoacidosis) (HCC)    Elevated troponin    GERD (gastroesophageal reflux disease)    GI bleed    Hypertension    bp under control-off meds since 2019   Ischemic leg    Past Surgical History:  Procedure Laterality Date   ABDOMINAL HYSTERECTOMY     AMPUTATION Right 12/27/2018   Procedure: AMPUTATION BELOW KNEE;  Surgeon: Annice Needy, MD;  Location: [facility];  Service: General;  Laterality: Right;   AMPUTATION Left 11/05/2021   Procedure: AMPUTATION BELOW KNEE;  Surgeon: Annice Needy, MD;  Location: [facility];  Service: Vascular;  Laterality: Left;   AMPUTATION Left 11/04/2022   Procedure: AMPUTATION ABOVE KNEE;  Surgeon: Annice Needy, MD;  Location: [facility];  Service: Vascular;  Laterality: Left;   APPLICATION OF WOUND VAC  10/16/2021   Procedure: APPLICATION OF WOUND VAC;  Surgeon: Annice Needy, MD;  Location: [facility];  Service: Vascular;;   CATARACT EXTRACTION W/PHACO Right 03/27/2020   Procedure: CATARACT EXTRACTION PHACO AND INTRAOCULAR LENS PLACEMENT (IOC) RIGHT DIABETIC 7.54 00:52.5;  Surgeon: Galen Manila, MD;  Location: [facility];  Service: Ophthalmology;  Laterality: Right;   CATARACT EXTRACTION W/PHACO Left 05/20/2020   Procedure: CATARACT EXTRACTION PHACO AND INTRAOCULAR LENS PLACEMENT (IOC) LEFT DIABETIC 4.95 00:37.6;  Surgeon: Galen Manila, MD;  Location: [facility];  Service: Ophthalmology;  Laterality: Left;  Diabetic - oral meds COVID + 04-24-20    CHOLECYSTECTOMY     COLONOSCOPY WITH PROPOFOL N/A 04/16/2019   Procedure: COLONOSCOPY WITH PROPOFOL;  Surgeon: Pasty Spillers, MD;  Location: [facility];  Service: Endoscopy;  Laterality: N/A;   COLONOSCOPY WITH PROPOFOL N/A 01/16/2020   Procedure: COLONOSCOPY WITH PROPOFOL;  Surgeon: Pasty Spillers, MD;  Location: [facility];  Service: Endoscopy;  Laterality: N/A;   ENDARTERECTOMY FEMORAL Left 10/16/2021   Procedure: ENDARTERECTOMY FEMORAL;  Surgeon: Annice Needy, MD;  Location: [facility];  Service: Vascular;  Laterality: Left;   ESOPHAGOGASTRODUODENOSCOPY (EGD) WITH PROPOFOL N/A 06/15/2018   Procedure: ESOPHAGOGASTRODUODENOSCOPY (EGD) WITH PROPOFOL;  Surgeon: Toledo, Boykin Nearing, MD;  Location: [facility];  Service: Gastroenterology;  Laterality: N/A;   ESOPHAGOGASTRODUODENOSCOPY (EGD) WITH PROPOFOL N/A 01/03/2019   Procedure: ESOPHAGOGASTRODUODENOSCOPY (EGD) WITH PROPOFOL;  Surgeon: Midge Minium, MD;  Location: [facility];  Service: Endoscopy;  Laterality: N/A;   ESOPHAGOGASTRODUODENOSCOPY (EGD) WITH PROPOFOL N/A 09/25/2021   Procedure: ESOPHAGOGASTRODUODENOSCOPY (EGD) WITH PROPOFOL;  Surgeon: Midge Minium, MD;  Location: [facility];  Service: Endoscopy;  Laterality: N/A;   EYE SURGERY Bilateral    GIVENS CAPSULE STUDY  04/16/2019   Procedure: GIVENS CAPSULE STUDY;  Surgeon: Pasty Spillers, MD;  Location: [facility];  Service: Endoscopy;;   GIVENS CAPSULE STUDY N/A 06/25/2019   Procedure: GIVENS CAPSULE STUDY;  Surgeon: Wyline Mood, MD;  Location: [facility];  Service: Gastroenterology;  Laterality: N/A;   INCISION AND DRAINAGE OF WOUND Left 02/24/2022   Procedure: LEFT BKA IRRIGATION AND DEBRIDEMENT WOUND;  Surgeon: Annice Needy, MD;  Location: [facility];  Service: Vascular;  Laterality: Left;   LOWER EXTREMITY ANGIOGRAPHY Right 08/21/2018   Procedure: LOWER EXTREMITY ANGIOGRAPHY;  Surgeon: Annice Needy, MD;  Location: [facility];  Service:  Cardiovascular;  Laterality: Right;   LOWER EXTREMITY ANGIOGRAPHY Left 08/28/2018   Procedure: LOWER EXTREMITY ANGIOGRAPHY;  Surgeon: Annice Needy, MD;  Location: [facility];  Service: Cardiovascular;  Laterality: Left;   LOWER EXTREMITY ANGIOGRAPHY Right 08/28/2018   Procedure: Lower Extremity Angiography;  Surgeon: Annice Needy, MD;  Location: [facility];  Service: Cardiovascular;  Laterality: Right;   LOWER EXTREMITY ANGIOGRAPHY Right 12/18/2018   Procedure: Lower Extremity Angiography;  Surgeon: Annice Needy, MD;  Location: [facility];  Service: Cardiovascular;  Laterality: Right;   LOWER EXTREMITY ANGIOGRAPHY Right 12/18/2018   Procedure: Lower Extremity Angiography;  Surgeon: Annice Needy, MD;  Location: [facility];  Service: Cardiovascular;  Laterality: Right;   LOWER EXTREMITY ANGIOGRAPHY Left 12/21/2018   Procedure: Lower Extremity Angiography;  Surgeon: Annice Needy, MD;  Location: [facility];  Service: Cardiovascular;  Laterality: Left;   LOWER EXTREMITY ANGIOGRAPHY Right 12/21/2018   Procedure: Lower Extremity Angiography;  Surgeon: Annice Needy, MD;  Location: [facility];  Service: Cardiovascular;  Laterality: Right;   LOWER EXTREMITY ANGIOGRAPHY Left 12/25/2020   Procedure: LOWER EXTREMITY ANGIOGRAPHY;  Surgeon: Annice Needy, MD;  Location: [facility];  Service: Cardiovascular;  Laterality: Left;   LOWER EXTREMITY ANGIOGRAPHY Left 09/21/2021   Procedure: Lower Extremity Angiography;  Surgeon: Annice Needy, MD;  Location: [facility];  Service: Cardiovascular;  Laterality: Left;   LOWER EXTREMITY ANGIOGRAPHY Left 10/13/2021   Procedure: Lower Extremity Angiography;  Surgeon: Renford Dills, MD;  Location: [facility];  Service: Cardiovascular;  Laterality: Left;   LOWER EXTREMITY ANGIOGRAPHY Left 10/14/2021   Procedure: Lower Extremity Angiography;  Surgeon: Annice Needy, MD;  Location: [facility];  Service: Cardiovascular;  Laterality: Left;   LOWER EXTREMITY ANGIOGRAPHY Left 11/02/2021   Procedure: Lower Extremity Angiography;  Surgeon: Annice Needy, MD;  Location: [facility];  Service: Cardiovascular;  Laterality: Left;   LOWER EXTREMITY INTERVENTION N/A 12/22/2018   Procedure: LOWER EXTREMITY INTERVENTION;  Surgeon: Annice Needy, MD;  Location: [facility];  Service: Cardiovascular;  Laterality: N/A;   LOWER EXTREMITY INTERVENTION Left 12/26/2020   Procedure: LOWER EXTREMITY INTERVENTION;  Surgeon: Annice Needy, MD;  Location: [facility];  Service: Cardiovascular;  Laterality: Left;   LOWER EXTREMITY INTERVENTION N/A 09/22/2021   Procedure: LOWER EXTREMITY INTERVENTION;  Surgeon: Annice Needy, MD;  Location: [facility];  Service: Cardiovascular;  Laterality: N/A;   Patient Active Problem List   Diagnosis Date Noted   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) 11/04/2022   Amputation above knee (HCC) 11/04/2022   Cellulitis of left lower extremity 02/23/2022   Sepsis due to cellulitis (HCC) 02/23/2022   Dyslipidemia 02/23/2022   Rheumatoid arthritis (HCC) 02/23/2022   Essential hypertension 02/23/2022   Type 2 diabetes mellitus with peripheral neuropathy (HCC) 02/23/2022   Multiple duodenal ulcers    Ischemic leg 12/25/2020   Rheumatoid arthritis of 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) 08/13/2020   Diabetes mellitus without complication (HCC)    GERD (gastroesophageal reflux disease)    COPD (chronic obstructive pulmonary disease) (  HCC)    GIB (gastrointestinal bleeding)    HLD (hyperlipidemia)    Depression    Elevated troponin    Syncope    Elevated troponin I level    Anemia of chronic disease 05/16/2019   Dizziness    Polyp of colon    Acute upper GI bleeding 04/13/2019   Anemia associated with acute blood loss 04/13/2019   Chronic anticoagulation 04/13/2019   Hx of right BKA  (HCC) 04/13/2019   Symptomatic anemia 04/13/2019   Upper GI bleed 04/13/2019   Acute GI bleeding 04/13/2019   Phantom pain after amputation of lower extremity (HCC) 01/30/2019   Melena    DU (duodenal ulcer)    DKA (diabetic ketoacidosis) (HCC) 12/14/2018   Atherosclerosis of artery of extremity with rest pain (HCC) 08/28/2018   PAD (peripheral artery disease) (HCC) 07/14/2018   Abdominal pain 06/13/2018   HTN (hypertension) 06/13/2018   Non-insulin dependent type 2 diabetes mellitus (HCC) 06/13/2018   Elevated lactic acid level 06/13/2018    [facility]  REFERRING PROVIDER: Georgiana Spinner, NP  REFERRING DIAG:  [phone] (ICD-10-CM) - Hx of right BKA (HCC)  [phone] (ICD-10-CM) - Hx of BKA, left (HCC)    THERAPY DIAG:  Difficulty in walking, not elsewhere classified  Muscle weakness (generalized)  Imbalance  History of amputation below knee, right (HCC)  History of above knee amputation, left (HCC)  Rationale for Evaluation and Treatment: Rehabilitation  ONSET DATE: L AKA 11/04/22   SUBJECTIVE:   SUBJECTIVE STATEMENT: Pt is a 68 year old female s/p L AKA 11/04/22. Pt has received above-knee prosthesis for L. Pt had R BKA in Sept 2020 with prosthetic rehab in this clinic. Pt is proficient with R below-knee prosthesis.  PERTINENT HISTORY: Patient reports standing with her prosthesis at home with her sister and walking with it some at home. Pt reports dealing with phantom limb pain. Patient reports getting this in R BKA and L AKA limbs. Patient reports Gabapentin helps to control phantom limb symptoms. Pt had R BKA 4-5 years ago. Pt reports some tenderness along end of L residual limb. Pt reports that she has not used her prosthesis much over previous 2 weeks. She reports using shrinker each day and completing regular checks on skin integrity for L residual limb.  Type of amputation: Above-knee amputation  Onset: L AKA 11/04/22 Follow-up appt with  MD: Pt following up with Georgiana Spinner for check-up between May and June Pain: 710 Present, 10/10 Worst: (Pain along L hip flexor/L groin) Phantom pain: Yes History of previous amputations/PAD: Yes; Hx of R BKA  Pain quality: pain quality: sharp Numbness/Tingling: Yes Imaging: No    PRECAUTIONS: None  RED FLAGS: Increased urinary urgency and functional incontinence, unknown etiology Disturbed sleep lying on L side, modified with change in position  WEIGHT BEARING RESTRICTIONS: No  FALLS:  Has patient fallen in last 6 months? No  LIVING ENVIRONMENT: Lives with:  sister, great granddaughter 62 y/o, significant other  Lives in: House/apartment; 3 steps in front to get into home; temporary living until pt finds home with flat entryway;  Shower chair in bathroom; pt able to get in/out of bathroom Stairs:  stairs into basement only, pt does not go there often Has following equipment at home: Dan Humphreys - 2 wheeled, Wheelchair (power), Wheelchair (manual), shower chair, and Grab bars; sliding board for W/C transfers  OCCUPATION: N/A  PLOF: Independent with household mobility with device  PATIENT GOALS: Get back to walking and  improved independence with L AKA prosthesis  NEXT MD VISIT: Between May-June   OBJECTIVE:  Note: Objective measures were completed at Evaluation unless otherwise noted.  MUSCULOSKELETAL: Tremor: Absent Tone: Normal, no spasticity, rigidity, or clonus Residual limb appears well-healing with no signs of erythema, skin breakdown, infection, or drainage  Posture Forward flexed posture in static standing  Palpation No pain to palpation along distal surface of residual limb.  No pain with palpation to quadriceps or hamstrings.    PATIENT SURVEYS:  ABC scale 6.3%  COGNITION: Overall cognitive status: Within functional limits for tasks assessed     SENSATION: Not tested   LOWER EXTREMITY ROM:  Active ROM Right eval Left eval  Hip flexion WNL WNL   Hip extension WNL -5  Hip abduction 40 40  Hip adduction    Hip internal rotation    Hip external rotation    Knee flexion    Knee extension    Ankle dorsiflexion    Ankle plantarflexion    Ankle inversion    Ankle eversion     (Blank rows = not tested)  LOWER EXTREMITY MMT:  MMT Right eval Left eval  Hip flexion 4+ 4  Hip extension 4 4  Hip abduction 4+ 4+  Hip adduction    Hip internal rotation    Hip external rotation    Knee flexion 4+   Knee extension 4+   Ankle dorsiflexion    Ankle plantarflexion    Ankle inversion    Ankle eversion     (Blank rows = not tested)   FUNCTIONAL TASKS:  W/C to bed transfer: CGA with sit to stand, pt presents with heavy forward lean and weight shift toward RLE, heavy upper limb push on FWW once pt has hands on walker. Heavy verbal cueing to push from seat versus walker for transferring.  Sit to stand: Difficulty initiating with slowed velocity of first 25% of transfer following lift from mat.   GAIT: Distance walked: 60 ft Assistive device utilized: Walker - 2 wheeled Level of assistance: CGA Comments: Heavy upper limb support on walker. Step-to pattern with RLE leading. Decreased terminal knee extension of L prosthesis at initial contact that can be partially corrected with verbal cueing for swing technique. Decreased LLE weight shift during stance phase. Decreased hip extension during mid-stance to terminal stance.                                                                                                                                 TREATMENT DATE: 07/06/2023    SUBJECTIVE STATEMENT:   Patient reports soreness after physical therapy yesterday. Soreness noted especially in arms and shoulders. She has continued wearing her prosthesis and liner at home. Patient reports no phantom limb pain at arrival but ongoing chronic low back pain. She continues working on HEP with her sister.    Ther-ex  Seated marches with manual  resistance from therapist 2 x 10  BLE; Seated clams with manual resistance from therapist 2 x 10; Seated adductor squeeze with manual resistance from therapist 2 x 10; Seated R LAQ with manual resistance 2 x 10; Seated R hamstring curls with manual resistance 2 x 10;   Therapeutic Activities - patient education, repetitive task practice for improved performance of daily functional activities e.g. transferring  Gait in rehab gym and hallway x 48', x 72'. Pt utilizes step-to pattern with LLE first after therapist provides cues. Mild forward flexed posture. She requires intermittent cues for L heel strike and to ensure that LLE prosthesis is locked before shifting weight. Also requires cues to take more weight through LLE and to relax BUE support on walker;  Stand pivot transfer x 2 with PT CGA to/from mat table. Verbal cueing for hand placement and turning on prosthesis; Bilateral stance in // bars without UE support with emphasis on bilateral weightbearing without asymmetrical weight shift to RLE (BKA leg) x 60 sec Weight shifting with bilateral symmetrical stance and no UE support alternating R/L   PATIENT EDUCATION:  Education details: see above for patient education details Person educated: Patient Education method: Explanation, Demonstration, and Handouts Education comprehension: verbalized understanding and returned demonstration  HOME EXERCISE PROGRAM: Access Code: [email or web address] URL: [email or web address] Date: 06/23/2023 Prepared by: Consuela Mimes  Exercises - Prone Hip Flexor Stretch with Towel Roll (AKA)  - 2-3 x daily - 7 x weekly - 1 sets - 2-3 min hold - Supine Hip Abduction  - 2 x daily - 7 x weekly - 2 sets - 10 reps - Sidelying Hip Abduction (AKA)  - 2 x daily - 7 x weekly - 2 sets - 10 reps - Prone Hip Extension with Residual Limb (AKA)  - 2 x daily - 7 x weekly - 2 sets - 10 reps  ASSESSMENT:  CLINICAL IMPRESSION: Patient is able to increase her  ambulation today with no significant LOB. She continues to struggle with excessive WB through BUE. She requires intermittent cues during gait to ensure LLE is locked prior to weight shift. We utilized mirror therapy again today to work on symmetrical weightbearing and weight shifting to each LE. Patient presents with prosthetic gait deviations, hip weakness, imbalance, and difficulty with weight shifting/transfers. Pt will continue to benefit from skilled PT services to address deficits and improve function.  OBJECTIVE IMPAIRMENTS: Abnormal gait, decreased balance, decreased mobility, difficulty walking, decreased strength, prosthetic dependency , and pain.   ACTIVITY LIMITATIONS: carrying, standing, stairs, transfers, bed mobility, and locomotion level  PARTICIPATION LIMITATIONS: cleaning, laundry, shopping, and community activity  PERSONAL FACTORS: Age, Past/current experiences, and 3+ comorbidities: (anxiety, depression, CKD, COPD, DM, DKA, HTN lower extremity ischemia)  are also affecting patient's functional outcome.   REHAB POTENTIAL: Good  CLINICAL DECISION MAKING: Evolving/moderate complexity  EVALUATION COMPLEXITY: Moderate   GOALS: Goals reviewed with patient? Yes  SHORT TERM GOALS: Target date: 07/14/23 Patient will be independent and 100% compliant with HEP as needed for carryover of PT intervention Baseline: 06/21/23: Baseline HEP reviewed.  Goal status: INITIAL  2.  Patient will attain L hip hyperextension to 15 deg indicative of improved ROM required for terminal stance/heel off during gait Baseline: 06/21/23: L hip extension -5 Goal status: INITIAL  3.  Patient will demonstrate modified-independent sit to stand and stand-pivot transfer from W/C to adjacent sitting surface with no LOB and no verbal cueing required from therapist for technique Baseline: 06/21/23: Difficulty and some cueing needed for safety with transferring and stand-pivot transfer Goal status:  INITIAL   LONG TERM GOALS: Target date: 09/01/23  Patient will ambulate at community-level distance with front-wheeled walker ModI with no LOB and normalized hip extension and swing through for L above-knee prosthesis Baseline: 06/21/23: Noted gait deviations and pt ambulating short of household distance with L AKA prosthesis Goal status: INITIAL  2.  Pt will improve ABC Scale by 13% or greater indicative of improved confidence related to balance and fall risk and clinically meaningful change Baseline: 06/21/23: ABC Scale to be obtained on visit # 2.   06/27/23: 6.3% Goal status: INITIAL  3.  Pt will perform TUG in less than 20 seconds indicative of decreased risk of falls relative to patient-specific population Baseline: 06/21/23: TUG to be completed on visit # 2.   06/27/23: 136 sec Goal status: INITIAL  4.  Pt will improve Amputee Mobility Predictor by 4 points or greater indicative of detectable change in ADLs and functional mobility with prostheses.  Baseline: 06/21/23: AMPPRO to be completed on visits #2-3.  Goal status: INITIAL  5.  Patient will negotiate 3 steps ModI with FWW as needed for entering/exiting her home. Baseline: 06/21/23: Significant difficulty with stair negotiation.  Goal status: INITIAL    PLAN:  PT FREQUENCY: 2x/week  PT DURATION: 10 weeks  PLANNED INTERVENTIONS: 97164- PT Re-evaluation, 97110-Therapeutic exercises, 97530- Therapeutic activity, [id number]- Neuromuscular re-education, [id number]- Self Care, [id number]- Prosthetic training, Patient/Family education, and Balance training  PLAN FOR NEXT SESSION: Continue with weight shifting and gait training with new L AKA prosthesis, correcting gait deviations. Provide education and handout for phantom limb pain. Open-chain strengthening for BLE.    Lynnea Maizes PT, DPT, GCS  Gertie Exon, PT 07/06/2023, 9:01 AM

## 2023-07-06 NOTE — Therapy (Signed)
 OUTPATIENT PHYSICAL THERAPY TREATMENT    Patient Name: Sylvia [name] MRN: [id number] DOB:1955-08-06, 68 y.o., female Today's Date: 07/06/2023  END OF SESSION:  PT End of Session - 07/06/23 0955     Visit Number 4    Number of Visits 21    Date for PT Re-Evaluation 09/01/23    Authorization Type [employer]aid    PT Start Time 0903    PT Stop Time 0950    PT Time Calculation (min) 47 min    Equipment Utilized During Treatment Gait belt   patient's personal FWW   Activity Tolerance Patient limited by fatigue;Patient tolerated treatment well   c/o bilat phantom limb pain   Behavior During Therapy [facility] for tasks assessed/performed              Past Medical History:  Diagnosis Date   Anemia of chronic disease 05/16/2019   Anxiety    h/o   Arthritis    Chronic kidney disease    Cocaine abuse (HCC)    COPD (chronic obstructive pulmonary disease) (HCC)    Coronary artery disease    Depression    Diabetes mellitus without complication (HCC)    DKA (diabetic ketoacidosis) (HCC)    Elevated troponin    GERD (gastroesophageal reflux disease)    GI bleed    Hypertension    bp under control-off meds since 2019   Ischemic leg    Past Surgical History:  Procedure Laterality Date   ABDOMINAL HYSTERECTOMY     AMPUTATION Right 12/27/2018   Procedure: AMPUTATION BELOW KNEE;  Surgeon: Annice Needy, MD;  Location: [facility];  Service: General;  Laterality: Right;   AMPUTATION Left 11/05/2021   Procedure: AMPUTATION BELOW KNEE;  Surgeon: Annice Needy, MD;  Location: [facility];  Service: Vascular;  Laterality: Left;   AMPUTATION Left 11/04/2022   Procedure: AMPUTATION ABOVE KNEE;  Surgeon: Annice Needy, MD;  Location: [facility];  Service: Vascular;  Laterality: Left;   APPLICATION OF WOUND VAC  10/16/2021   Procedure: APPLICATION OF WOUND VAC;  Surgeon: Annice Needy, MD;  Location: [facility];  Service: Vascular;;   CATARACT EXTRACTION W/PHACO Right 03/27/2020   Procedure: CATARACT  EXTRACTION PHACO AND INTRAOCULAR LENS PLACEMENT (IOC) RIGHT DIABETIC 7.54 00:52.5;  Surgeon: Galen Manila, MD;  Location: [facility];  Service: Ophthalmology;  Laterality: Right;   CATARACT EXTRACTION W/PHACO Left 05/20/2020   Procedure: CATARACT EXTRACTION PHACO AND INTRAOCULAR LENS PLACEMENT (IOC) LEFT DIABETIC 4.95 00:37.6;  Surgeon: Galen Manila, MD;  Location: [facility];  Service: Ophthalmology;  Laterality: Left;  Diabetic - oral meds COVID + 04-24-20   CHOLECYSTECTOMY     COLONOSCOPY WITH PROPOFOL N/A 04/16/2019   Procedure: COLONOSCOPY WITH PROPOFOL;  Surgeon: Pasty Spillers, MD;  Location: [facility];  Service: Endoscopy;  Laterality: N/A;   COLONOSCOPY WITH PROPOFOL N/A 01/16/2020   Procedure: COLONOSCOPY WITH PROPOFOL;  Surgeon: Pasty Spillers, MD;  Location: [facility];  Service: Endoscopy;  Laterality: N/A;   ENDARTERECTOMY FEMORAL Left 10/16/2021   Procedure: ENDARTERECTOMY FEMORAL;  Surgeon: Annice Needy, MD;  Location: [facility];  Service: Vascular;  Laterality: Left;   ESOPHAGOGASTRODUODENOSCOPY (EGD) WITH PROPOFOL N/A 06/15/2018   Procedure: ESOPHAGOGASTRODUODENOSCOPY (EGD) WITH PROPOFOL;  Surgeon: Toledo, Boykin Nearing, MD;  Location: [facility];  Service: Gastroenterology;  Laterality: N/A;   ESOPHAGOGASTRODUODENOSCOPY (EGD) WITH PROPOFOL N/A 01/03/2019   Procedure: ESOPHAGOGASTRODUODENOSCOPY (EGD) WITH PROPOFOL;  Surgeon: Midge Minium, MD;  Location: [facility];  Service: Endoscopy;  Laterality: N/A;   ESOPHAGOGASTRODUODENOSCOPY (EGD) WITH PROPOFOL N/A 09/25/2021   Procedure: ESOPHAGOGASTRODUODENOSCOPY (EGD) WITH PROPOFOL;  Surgeon: Midge Minium, MD;  Location: [facility];  Service: Endoscopy;  Laterality: N/A;   EYE SURGERY Bilateral    GIVENS CAPSULE STUDY  04/16/2019   Procedure: GIVENS CAPSULE STUDY;  Surgeon: Pasty Spillers, MD;  Location: [facility];  Service: Endoscopy;;   GIVENS CAPSULE STUDY N/A  06/25/2019   Procedure: GIVENS CAPSULE STUDY;  Surgeon: Wyline Mood, MD;  Location: [facility];  Service: Gastroenterology;  Laterality: N/A;   INCISION AND DRAINAGE OF WOUND Left 02/24/2022   Procedure: LEFT BKA IRRIGATION AND DEBRIDEMENT WOUND;  Surgeon: Annice Needy, MD;  Location: [facility];  Service: Vascular;  Laterality: Left;   LOWER EXTREMITY ANGIOGRAPHY Right 08/21/2018   Procedure: LOWER EXTREMITY ANGIOGRAPHY;  Surgeon: Annice Needy, MD;  Location: [facility];  Service: Cardiovascular;  Laterality: Right;   LOWER EXTREMITY ANGIOGRAPHY Left 08/28/2018   Procedure: LOWER EXTREMITY ANGIOGRAPHY;  Surgeon: Annice Needy, MD;  Location: [facility];  Service: Cardiovascular;  Laterality: Left;   LOWER EXTREMITY ANGIOGRAPHY Right 08/28/2018   Procedure: Lower Extremity Angiography;  Surgeon: Annice Needy, MD;  Location: [facility];  Service: Cardiovascular;  Laterality: Right;   LOWER EXTREMITY ANGIOGRAPHY Right 12/18/2018   Procedure: Lower Extremity Angiography;  Surgeon: Annice Needy, MD;  Location: [facility];  Service: Cardiovascular;  Laterality: Right;   LOWER EXTREMITY ANGIOGRAPHY Right 12/18/2018   Procedure: Lower Extremity Angiography;  Surgeon: Annice Needy, MD;  Location: [facility];  Service: Cardiovascular;  Laterality: Right;   LOWER EXTREMITY ANGIOGRAPHY Left 12/21/2018   Procedure: Lower Extremity Angiography;  Surgeon: Annice Needy, MD;  Location: [facility];  Service: Cardiovascular;  Laterality: Left;   LOWER EXTREMITY ANGIOGRAPHY Right 12/21/2018   Procedure: Lower Extremity Angiography;  Surgeon: Annice Needy, MD;  Location: [facility];  Service: Cardiovascular;  Laterality: Right;   LOWER EXTREMITY ANGIOGRAPHY Left 12/25/2020   Procedure: LOWER EXTREMITY ANGIOGRAPHY;  Surgeon: Annice Needy, MD;  Location: [facility];  Service: Cardiovascular;  Laterality: Left;   LOWER EXTREMITY  ANGIOGRAPHY Left 09/21/2021   Procedure: Lower Extremity Angiography;  Surgeon: Annice Needy, MD;  Location: [facility];  Service: Cardiovascular;  Laterality: Left;   LOWER EXTREMITY ANGIOGRAPHY Left 10/13/2021   Procedure: Lower Extremity Angiography;  Surgeon: Renford Dills, MD;  Location: [facility];  Service: Cardiovascular;  Laterality: Left;   LOWER EXTREMITY ANGIOGRAPHY Left 10/14/2021   Procedure: Lower Extremity Angiography;  Surgeon: Annice Needy, MD;  Location: [facility];  Service: Cardiovascular;  Laterality: Left;   LOWER EXTREMITY ANGIOGRAPHY Left 11/02/2021   Procedure: Lower Extremity Angiography;  Surgeon: Annice Needy, MD;  Location: [facility];  Service: Cardiovascular;  Laterality: Left;   LOWER EXTREMITY INTERVENTION N/A 12/22/2018   Procedure: LOWER EXTREMITY INTERVENTION;  Surgeon: Annice Needy, MD;  Location: [facility];  Service: Cardiovascular;  Laterality: N/A;   LOWER EXTREMITY INTERVENTION Left 12/26/2020   Procedure: LOWER EXTREMITY INTERVENTION;  Surgeon: Annice Needy, MD;  Location: [facility];  Service: Cardiovascular;  Laterality: Left;   LOWER EXTREMITY INTERVENTION N/A 09/22/2021   Procedure: LOWER EXTREMITY INTERVENTION;  Surgeon: Annice Needy, MD;  Location: [facility];  Service: Cardiovascular;  Laterality: N/A;   Patient Active Problem List  Diagnosis Date Noted   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) 11/04/2022   Amputation above knee (HCC) 11/04/2022   Cellulitis of left lower extremity 02/23/2022   Sepsis due to cellulitis (HCC) 02/23/2022   Dyslipidemia 02/23/2022   Rheumatoid arthritis (HCC) 02/23/2022   Essential hypertension 02/23/2022   Type 2 diabetes mellitus with peripheral neuropathy (HCC) 02/23/2022   Multiple duodenal ulcers    Ischemic leg 12/25/2020   Rheumatoid arthritis of 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)  08/13/2020   Diabetes mellitus without complication (HCC)    GERD (gastroesophageal reflux disease)    COPD (chronic obstructive pulmonary disease) (HCC)    GIB (gastrointestinal bleeding)    HLD (hyperlipidemia)    Depression    Elevated troponin    Syncope    Elevated troponin I level    Anemia of chronic disease 05/16/2019   Dizziness    Polyp of colon    Acute upper GI bleeding 04/13/2019   Anemia associated with acute blood loss 04/13/2019   Chronic anticoagulation 04/13/2019   Hx of right BKA (HCC) 04/13/2019   Symptomatic anemia 04/13/2019   Upper GI bleed 04/13/2019   Acute GI bleeding 04/13/2019   Phantom pain after amputation of lower extremity (HCC) 01/30/2019   Melena    DU (duodenal ulcer)    DKA (diabetic ketoacidosis) (HCC) 12/14/2018   Atherosclerosis of artery of extremity with rest pain (HCC) 08/28/2018   PAD (peripheral artery disease) (HCC) 07/14/2018   Abdominal pain 06/13/2018   HTN (hypertension) 06/13/2018   Non-insulin dependent type 2 diabetes mellitus (HCC) 06/13/2018   Elevated lactic acid level 06/13/2018    PCP: [facility]  REFERRING PROVIDER: Georgiana Spinner, NP  REFERRING DIAG:  [phone] (ICD-10-CM) - Hx of right BKA (HCC)  Z89.512 (ICD-10-CM) - Hx of BKA, left (HCC)    THERAPY DIAG:  Difficulty in walking, not elsewhere classified  Muscle weakness (generalized)  Imbalance  History of amputation below knee, right (HCC)  History of above knee amputation, left (HCC)  Rationale for Evaluation and Treatment: Rehabilitation  ONSET DATE: L AKA 11/04/22   SUBJECTIVE:   SUBJECTIVE STATEMENT: Pt is a 68 year old female s/p L AKA 11/04/22. Pt has received above-knee prosthesis for L. Pt had R BKA in Sept 2020 with prosthetic rehab in this clinic. Pt is proficient with R below-knee prosthesis.  PERTINENT HISTORY: Patient reports standing with her prosthesis at home with her sister and walking with it some at  home. Pt reports dealing with phantom limb pain. Patient reports getting this in R BKA and L AKA limbs. Patient reports Gabapentin helps to control phantom limb symptoms. Pt had R BKA 4-5 years ago. Pt reports some tenderness along end of L residual limb. Pt reports that she has not used her prosthesis much over previous 2 weeks. She reports using shrinker each day and completing regular checks on skin integrity for L residual limb.  Type of amputation: Above-knee amputation  Onset: L AKA 11/04/22 Follow-up appt with MD: Pt following up with Georgiana Spinner for check-up between May and June Pain: 710 Present, 10/10 Worst: (Pain along L hip flexor/L groin) Phantom pain: Yes History of previous amputations/PAD: Yes; Hx of R BKA  Pain quality: pain quality: sharp Numbness/Tingling: Yes Imaging: No    PRECAUTIONS: None  RED FLAGS: Increased urinary urgency and functional incontinence, unknown etiology Disturbed sleep lying on L side, modified with change in position  WEIGHT BEARING RESTRICTIONS: No  FALLS:  Has patient fallen in last 6 months? No  LIVING ENVIRONMENT: Lives with:  sister, great granddaughter 40 y/o, significant other  Lives in: House/apartment; 3 steps in front to get into home; temporary living until pt finds home with flat entryway;  Shower chair in bathroom; pt able to get in/out of bathroom Stairs:  stairs into basement only, pt does not go there often Has following equipment at home: Dan Humphreys - 2 wheeled, Wheelchair (power), Wheelchair (manual), shower chair, and Grab bars; sliding board for W/C transfers  OCCUPATION: N/A  PLOF: Independent with household mobility with device  PATIENT GOALS: Get back to walking and improved independence with L AKA prosthesis  NEXT MD VISIT: Between May-June   OBJECTIVE:  Note: Objective measures were completed at Evaluation unless otherwise noted.  MUSCULOSKELETAL: Tremor: Absent Tone: Normal, no spasticity, rigidity, or  clonus Residual limb appears well-healing with no signs of erythema, skin breakdown, infection, or drainage  Posture Forward flexed posture in static standing  Palpation No pain to palpation along distal surface of residual limb.  No pain with palpation to quadriceps or hamstrings.    PATIENT SURVEYS:  ABC scale 6.3%  COGNITION: Overall cognitive status: Within functional limits for tasks assessed     SENSATION: Not tested   LOWER EXTREMITY ROM:  Active ROM Right eval Left eval  Hip flexion WNL WNL  Hip extension WNL -5  Hip abduction 40 40  Hip adduction    Hip internal rotation    Hip external rotation    Knee flexion    Knee extension    Ankle dorsiflexion    Ankle plantarflexion    Ankle inversion    Ankle eversion     (Blank rows = not tested)  LOWER EXTREMITY MMT:  MMT Right eval Left eval  Hip flexion 4+ 4  Hip extension 4 4  Hip abduction 4+ 4+  Hip adduction    Hip internal rotation    Hip external rotation    Knee flexion 4+   Knee extension 4+   Ankle dorsiflexion    Ankle plantarflexion    Ankle inversion    Ankle eversion     (Blank rows = not tested)   FUNCTIONAL TASKS:  W/C to bed transfer: CGA with sit to stand, pt presents with heavy forward lean and weight shift toward RLE, heavy upper limb push on FWW once pt has hands on walker. Heavy verbal cueing to push from seat versus walker for transferring.  Sit to stand: Difficulty initiating with slowed velocity of first 25% of transfer following lift from mat.   GAIT: Distance walked: 60 ft Assistive device utilized: Walker - 2 wheeled Level of assistance: CGA Comments: Heavy upper limb support on walker. Step-to pattern with RLE leading. Decreased terminal knee extension of L prosthesis at initial contact that can be partially corrected with verbal cueing for swing technique. Decreased LLE weight shift during stance phase. Decreased hip extension during mid-stance to terminal stance.  TREATMENT DATE: 07/06/2023    SUBJECTIVE STATEMENT:   Patient reports feeling tired at arrival due to being up later the night before. She reports spending most of the day in her prostheses at home. She reports compliance with HEP and completing gait trials with walker in her home.     Therapeutic Activities - patient education, repetitive task practice for improved performance of daily functional activities e.g. transferring  Gait in rehab gym and hallway x 50', x 70'. Pt utilizes step-to pattern with LLE, minimal cueing for attaining full extension of L above-knee prosthesis.   Stand pivot transfer x 2 with PT CGA to/from mat table. Verbal cueing for hand placement and turning on prosthesis;  Completion of AMP-Pro*  23/47 (K2 category)  *next visit* Bilateral stance in // bars without UE support with emphasis on bilateral weightbearing without asymmetrical weight shift to RLE (BKA leg) x 60 sec Weight shifting with bilateral symmetrical stance and no UE support alternating R/L   PATIENT EDUCATION:  Education details: see above for patient education details Person educated: Patient Education method: Explanation, Demonstration, and Handouts Education comprehension: verbalized understanding and returned demonstration  HOME EXERCISE PROGRAM: Access Code: [email or web address] URL: [email or web address] Date: 06/23/2023 Prepared by: Consuela Mimes  Exercises - Prone Hip Flexor Stretch with Towel Roll (AKA)  - 2-3 x daily - 7 x weekly - 1 sets - 2-3 min hold - Supine Hip Abduction  - 2 x daily - 7 x weekly - 2 sets - 10 reps - Sidelying Hip Abduction (AKA)  - 2 x daily - 7 x weekly - 2 sets - 10 reps - Prone Hip Extension with Residual Limb (AKA)  - 2 x daily - 7 x weekly - 2 sets - 10 reps  ASSESSMENT:  CLINICAL IMPRESSION: Patient exhibits  improved LLE swing to achieve full extension and heel strike prior to full weight shift/loading response during gait. Patient is significantly challenged with stair descent more so than ascent due to performance of AMP-Pro today. Pt scored 23/47 on AMP-Pro, placing her in K2 category for limited community-level ambulation. Pt needs continued work on accepting weight onto L lower limb and improved symmetry of weightbearing. Patient presents with prosthetic gait deviations, hip weakness, imbalance, and difficulty with weight shifting/transfers. Pt will continue to benefit from skilled PT services to address deficits and improve function.  OBJECTIVE IMPAIRMENTS: Abnormal gait, decreased balance, decreased mobility, difficulty walking, decreased strength, prosthetic dependency , and pain.   ACTIVITY LIMITATIONS: carrying, standing, stairs, transfers, bed mobility, and locomotion level  PARTICIPATION LIMITATIONS: cleaning, laundry, shopping, and community activity  PERSONAL FACTORS: Age, Past/current experiences, and 3+ comorbidities: (anxiety, depression, CKD, COPD, DM, DKA, HTN lower extremity ischemia)  are also affecting patient's functional outcome.   REHAB POTENTIAL: Good  CLINICAL DECISION MAKING: Evolving/moderate complexity  EVALUATION COMPLEXITY: Moderate   GOALS: Goals reviewed with patient? Yes  SHORT TERM GOALS: Target date: 07/14/23 Patient will be independent and 100% compliant with HEP as needed for carryover of PT intervention Baseline: 06/21/23: Baseline HEP reviewed.  Goal status: INITIAL  2.  Patient will attain L hip hyperextension to 15 deg indicative of improved ROM required for terminal stance/heel off during gait Baseline: 06/21/23: L hip extension -5 Goal status: INITIAL  3.  Patient will demonstrate modified-independent sit to stand and stand-pivot transfer from W/C to adjacent sitting surface with no LOB and no verbal cueing required from therapist for  technique Baseline: 06/21/23: Difficulty and some cueing needed for safety with transferring and stand-pivot  transfer Goal status: INITIAL   LONG TERM GOALS: Target date: 09/01/23  Patient will ambulate at community-level distance with front-wheeled walker ModI with no LOB and normalized hip extension and swing through for L above-knee prosthesis Baseline: 06/21/23: Noted gait deviations and pt ambulating short of household distance with L AKA prosthesis Goal status: INITIAL  2.  Pt will improve ABC Scale by 13% or greater indicative of improved confidence related to balance and fall risk and clinically meaningful change Baseline: 06/21/23: ABC Scale to be obtained on visit # 2.   06/27/23: 6.3% Goal status: INITIAL  3.  Pt will perform TUG in less than 20 seconds indicative of decreased risk of falls relative to patient-specific population Baseline: 06/21/23: TUG to be completed on visit # 2.   06/27/23: 136 sec Goal status: INITIAL  4.  Pt will improve Amputee Mobility Predictor by 4 points or greater indicative of detectable change in ADLs and functional mobility with prostheses.  Baseline: 06/21/23: AMPPRO to be completed on visits #2-3.  07/06/23: 23/47 (K2).  Goal status: INITIAL  5.  Patient will negotiate 3 steps ModI with FWW as needed for entering/exiting her home. Baseline: 06/21/23: Significant difficulty with stair negotiation.  Goal status: INITIAL    PLAN:  PT FREQUENCY: 2x/week  PT DURATION: 10 weeks  PLANNED INTERVENTIONS: 97164- PT Re-evaluation, 97110-Therapeutic exercises, 97530- Therapeutic activity, [id number]- Neuromuscular re-education, [id number]- Self Care, 44010- Prosthetic training, Patient/Family education, and Balance training  PLAN FOR NEXT SESSION: Continue with weight shifting and gait training with new L AKA prosthesis, correcting gait deviations. Open-chain strengthening for BLE.    Consuela Mimes, PT, DPT #[id number]  Gertie Exon, PT 07/06/2023, 9:56 AM

## 2023-07-12 ENCOUNTER — Ambulatory Visit: Payer: 59 | Admitting: Physical Therapy

## 2023-07-14 ENCOUNTER — Ambulatory Visit: Payer: 59 | Admitting: Physical Therapy

## 2023-07-14 ENCOUNTER — Encounter: Payer: Self-pay | Admitting: Physical Therapy

## 2023-07-14 DIAGNOSIS — R262 Difficulty in walking, not elsewhere classified: Secondary | ICD-10-CM

## 2023-07-14 DIAGNOSIS — R2689 Other abnormalities of gait and mobility: Secondary | ICD-10-CM

## 2023-07-14 DIAGNOSIS — Z89612 Acquired absence of left leg above knee: Secondary | ICD-10-CM

## 2023-07-14 DIAGNOSIS — Z89511 Acquired absence of right leg below knee: Secondary | ICD-10-CM

## 2023-07-14 DIAGNOSIS — M6281 Muscle weakness (generalized): Secondary | ICD-10-CM

## 2023-07-14 NOTE — Therapy (Signed)
 OUTPATIENT PHYSICAL THERAPY TREATMENT    Patient Name: Sylvia [name] MRN: [id number] DOB:[birth year], 68 y.o., female Today's Date: 07/14/2023  END OF SESSION:  PT End of Session - 07/14/23 1235     Visit Number 5    Number of Visits 21    Date for PT Re-Evaluation 09/01/23    Authorization Type [employer]    PT Start Time 0904    PT Stop Time 0947    PT Time Calculation (min) 43 min    Equipment Utilized During Treatment Gait belt   patient's personal FWW   Activity Tolerance Patient limited by fatigue;Patient tolerated treatment well   c/o bilat phantom limb pain   Behavior During Therapy WFL for tasks assessed/performed              Past Medical History:  Diagnosis Date   Anemia of chronic disease 05/16/2019   Anxiety    h/o   Arthritis    Chronic kidney disease    Cocaine abuse (HCC)    COPD (chronic obstructive pulmonary disease) (HCC)    Coronary artery disease    Depression    Diabetes mellitus without complication (HCC)    DKA (diabetic ketoacidosis) (HCC)    Elevated troponin    GERD (gastroesophageal reflux disease)    GI bleed    Hypertension    bp under control-off meds since 2019   Ischemic leg    Past Surgical History:  Procedure Laterality Date   ABDOMINAL HYSTERECTOMY     AMPUTATION Right 12/27/2018   Procedure: AMPUTATION BELOW KNEE;  Surgeon: Annice Needy, MD;  Location: [facility];  Service: General;  Laterality: Right;   AMPUTATION Left 11/05/2021   Procedure: AMPUTATION BELOW KNEE;  Surgeon: Annice Needy, MD;  Location: [facility];  Service: Vascular;  Laterality: Left;   AMPUTATION Left 11/04/2022   Procedure: AMPUTATION ABOVE KNEE;  Surgeon: Annice Needy, MD;  Location: [facility];  Service: Vascular;  Laterality: Left;   APPLICATION OF WOUND VAC  10/16/2021   Procedure: APPLICATION OF WOUND VAC;  Surgeon: Annice Needy, MD;  Location: [facility];  Service: Vascular;;   CATARACT EXTRACTION W/PHACO Right 03/27/2020   Procedure: CATARACT  EXTRACTION PHACO AND INTRAOCULAR LENS PLACEMENT (IOC) RIGHT DIABETIC 7.54 00:52.5;  Surgeon: Galen Manila, MD;  Location: [facility];  Service: Ophthalmology;  Laterality: Right;   CATARACT EXTRACTION W/PHACO Left 05/20/2020   Procedure: CATARACT EXTRACTION PHACO AND INTRAOCULAR LENS PLACEMENT (IOC) LEFT DIABETIC 4.95 00:37.6;  Surgeon: Galen Manila, MD;  Location: [facility];  Service: Ophthalmology;  Laterality: Left;  Diabetic - oral meds COVID + 04-24-20   CHOLECYSTECTOMY     COLONOSCOPY WITH PROPOFOL N/A 04/16/2019   Procedure: COLONOSCOPY WITH PROPOFOL;  Surgeon: Pasty Spillers, MD;  Location: [facility];  Service: Endoscopy;  Laterality: N/A;   COLONOSCOPY WITH PROPOFOL N/A 01/16/2020   Procedure: COLONOSCOPY WITH PROPOFOL;  Surgeon: Pasty Spillers, MD;  Location: [facility];  Service: Endoscopy;  Laterality: N/A;   ENDARTERECTOMY FEMORAL Left 10/16/2021   Procedure: ENDARTERECTOMY FEMORAL;  Surgeon: Annice Needy, MD;  Location: [facility];  Service: Vascular;  Laterality: Left;   ESOPHAGOGASTRODUODENOSCOPY (EGD) WITH PROPOFOL N/A 06/15/2018   Procedure: ESOPHAGOGASTRODUODENOSCOPY (EGD) WITH PROPOFOL;  Surgeon: Toledo, Boykin Nearing, MD;  Location: [facility];  Service: Gastroenterology;  Laterality: N/A;   ESOPHAGOGASTRODUODENOSCOPY (EGD) WITH PROPOFOL N/A 01/03/2019   Procedure: ESOPHAGOGASTRODUODENOSCOPY (EGD) WITH PROPOFOL;  Surgeon: Midge Minium, MD;  Location: [facility];  Service: Endoscopy;  Laterality: N/A;   ESOPHAGOGASTRODUODENOSCOPY (EGD) WITH PROPOFOL N/A 09/25/2021   Procedure: ESOPHAGOGASTRODUODENOSCOPY (EGD) WITH PROPOFOL;  Surgeon: Midge Minium, MD;  Location: [facility];  Service: Endoscopy;  Laterality: N/A;   EYE SURGERY Bilateral    GIVENS CAPSULE STUDY  04/16/2019   Procedure: GIVENS CAPSULE STUDY;  Surgeon: Pasty Spillers, MD;  Location: [facility];  Service: Endoscopy;;   GIVENS CAPSULE STUDY N/A  06/25/2019   Procedure: GIVENS CAPSULE STUDY;  Surgeon: Wyline Mood, MD;  Location: [facility];  Service: Gastroenterology;  Laterality: N/A;   INCISION AND DRAINAGE OF WOUND Left 02/24/2022   Procedure: LEFT BKA IRRIGATION AND DEBRIDEMENT WOUND;  Surgeon: Annice Needy, MD;  Location: [facility];  Service: Vascular;  Laterality: Left;   LOWER EXTREMITY ANGIOGRAPHY Right 08/21/2018   Procedure: LOWER EXTREMITY ANGIOGRAPHY;  Surgeon: Annice Needy, MD;  Location: [facility];  Service: Cardiovascular;  Laterality: Right;   LOWER EXTREMITY ANGIOGRAPHY Left 08/28/2018   Procedure: LOWER EXTREMITY ANGIOGRAPHY;  Surgeon: Annice Needy, MD;  Location: [facility];  Service: Cardiovascular;  Laterality: Left;   LOWER EXTREMITY ANGIOGRAPHY Right 08/28/2018   Procedure: Lower Extremity Angiography;  Surgeon: Annice Needy, MD;  Location: [facility];  Service: Cardiovascular;  Laterality: Right;   LOWER EXTREMITY ANGIOGRAPHY Right 12/18/2018   Procedure: Lower Extremity Angiography;  Surgeon: Annice Needy, MD;  Location: [facility];  Service: Cardiovascular;  Laterality: Right;   LOWER EXTREMITY ANGIOGRAPHY Right 12/18/2018   Procedure: Lower Extremity Angiography;  Surgeon: Annice Needy, MD;  Location: [facility];  Service: Cardiovascular;  Laterality: Right;   LOWER EXTREMITY ANGIOGRAPHY Left 12/21/2018   Procedure: Lower Extremity Angiography;  Surgeon: Annice Needy, MD;  Location: [facility];  Service: Cardiovascular;  Laterality: Left;   LOWER EXTREMITY ANGIOGRAPHY Right 12/21/2018   Procedure: Lower Extremity Angiography;  Surgeon: Annice Needy, MD;  Location: [facility];  Service: Cardiovascular;  Laterality: Right;   LOWER EXTREMITY ANGIOGRAPHY Left 12/25/2020   Procedure: LOWER EXTREMITY ANGIOGRAPHY;  Surgeon: Annice Needy, MD;  Location: [facility];  Service: Cardiovascular;  Laterality: Left;   LOWER EXTREMITY  ANGIOGRAPHY Left 09/21/2021   Procedure: Lower Extremity Angiography;  Surgeon: Annice Needy, MD;  Location: [facility];  Service: Cardiovascular;  Laterality: Left;   LOWER EXTREMITY ANGIOGRAPHY Left 10/13/2021   Procedure: Lower Extremity Angiography;  Surgeon: Renford Dills, MD;  Location: [facility];  Service: Cardiovascular;  Laterality: Left;   LOWER EXTREMITY ANGIOGRAPHY Left 10/14/2021   Procedure: Lower Extremity Angiography;  Surgeon: Annice Needy, MD;  Location: [facility];  Service: Cardiovascular;  Laterality: Left;   LOWER EXTREMITY ANGIOGRAPHY Left 11/02/2021   Procedure: Lower Extremity Angiography;  Surgeon: Annice Needy, MD;  Location: [facility];  Service: Cardiovascular;  Laterality: Left;   LOWER EXTREMITY INTERVENTION N/A 12/22/2018   Procedure: LOWER EXTREMITY INTERVENTION;  Surgeon: Annice Needy, MD;  Location: [facility];  Service: Cardiovascular;  Laterality: N/A;   LOWER EXTREMITY INTERVENTION Left 12/26/2020   Procedure: LOWER EXTREMITY INTERVENTION;  Surgeon: Annice Needy, MD;  Location: [facility];  Service: Cardiovascular;  Laterality: Left;   LOWER EXTREMITY INTERVENTION N/A 09/22/2021   Procedure: LOWER EXTREMITY INTERVENTION;  Surgeon: Annice Needy, MD;  Location: [facility];  Service: Cardiovascular;  Laterality: N/A;   Patient Active Problem List  Diagnosis Date Noted   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) 11/04/2022   Amputation above knee (HCC) 11/04/2022   Cellulitis of left lower extremity 02/23/2022   Sepsis due to cellulitis (HCC) 02/23/2022   Dyslipidemia 02/23/2022   Rheumatoid arthritis (HCC) 02/23/2022   Essential hypertension 02/23/2022   Type 2 diabetes mellitus with peripheral neuropathy (HCC) 02/23/2022   Multiple duodenal ulcers    Ischemic leg 12/25/2020   Rheumatoid arthritis of 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)  08/13/2020   Diabetes mellitus without complication (HCC)    GERD (gastroesophageal reflux disease)    COPD (chronic obstructive pulmonary disease) (HCC)    GIB (gastrointestinal bleeding)    HLD (hyperlipidemia)    Depression    Elevated troponin    Syncope    Elevated troponin I level    Anemia of chronic disease 05/16/2019   Dizziness    Polyp of colon    Acute upper GI bleeding 04/13/2019   Anemia associated with acute blood loss 04/13/2019   Chronic anticoagulation 04/13/2019   Hx of right BKA (HCC) 04/13/2019   Symptomatic anemia 04/13/2019   Upper GI bleed 04/13/2019   Acute GI bleeding 04/13/2019   Phantom pain after amputation of lower extremity (HCC) 01/30/2019   Melena    DU (duodenal ulcer)    DKA (diabetic ketoacidosis) (HCC) 12/14/2018   Atherosclerosis of artery of extremity with rest pain (HCC) 08/28/2018   PAD (peripheral artery disease) (HCC) 07/14/2018   Abdominal pain 06/13/2018   HTN (hypertension) 06/13/2018   Non-insulin dependent type 2 diabetes mellitus (HCC) 06/13/2018   Elevated lactic acid level 06/13/2018    [facility]P: [facility]  REFERRING PROVIDER: Georgiana Spinner, NP  REFERRING DIAG:  [phone] (ICD-10-CM) - Hx of right BKA (HCC)  Z89.512 (ICD-10-CM) - Hx of BKA, left (HCC)    THERAPY DIAG:  Difficulty in walking, not elsewhere classified  Muscle weakness (generalized)  Imbalance  History of amputation below knee, right (HCC)  History of above knee amputation, left (HCC)  Rationale for Evaluation and Treatment: Rehabilitation  ONSET DATE: L AKA 11/04/22   SUBJECTIVE:   SUBJECTIVE STATEMENT: Pt is a 68 year old female s/p L AKA 11/04/22. Pt has received above-knee prosthesis for L. Pt had R BKA in Sept 2020 with prosthetic rehab in this clinic. Pt is proficient with R below-knee prosthesis.  PERTINENT HISTORY: Patient reports standing with her prosthesis at home with her sister and walking with it some at  home. Pt reports dealing with phantom limb pain. Patient reports getting this in R BKA and L AKA limbs. Patient reports Gabapentin helps to control phantom limb symptoms. Pt had R BKA 4-5 years ago. Pt reports some tenderness along end of L residual limb. Pt reports that she has not used her prosthesis much over previous 2 weeks. She reports using shrinker each day and completing regular checks on skin integrity for L residual limb.  Type of amputation: Above-knee amputation  Onset: L AKA 11/04/22 Follow-up appt with MD: Pt following up with Georgiana Spinner for check-up between May and June Pain: 710 Present, 10/10 Worst: (Pain along L hip flexor/L groin) Phantom pain: Yes History of previous amputations/PAD: Yes; Hx of R BKA  Pain quality: pain quality: sharp Numbness/Tingling: Yes Imaging: No    PRECAUTIONS: None  RED FLAGS: Increased urinary urgency and functional incontinence, unknown etiology Disturbed sleep lying on L side, modified with change in position  WEIGHT BEARING RESTRICTIONS: No  FALLS:  Has patient fallen in last 6 months? No  LIVING ENVIRONMENT: Lives with:  sister, great granddaughter 48 y/o, significant other  Lives in: House/apartment; 3 steps in front to get into home; temporary living until pt finds home with flat entryway;  Shower chair in bathroom; pt able to get in/out of bathroom Stairs:  stairs into basement only, pt does not go there often Has following equipment at home: Dan Humphreys - 2 wheeled, Wheelchair (power), Wheelchair (manual), shower chair, and Grab bars; sliding board for W/C transfers  OCCUPATION: N/A  PLOF: Independent with household mobility with device  PATIENT GOALS: Get back to walking and improved independence with L AKA prosthesis  NEXT MD VISIT: Between May-June   OBJECTIVE:  Note: Objective measures were completed at Evaluation unless otherwise noted.  MUSCULOSKELETAL: Tremor: Absent Tone: Normal, no spasticity, rigidity, or  clonus Residual limb appears well-healing with no signs of erythema, skin breakdown, infection, or drainage  Posture Forward flexed posture in static standing  Palpation No pain to palpation along distal surface of residual limb.  No pain with palpation to quadriceps or hamstrings.    PATIENT SURVEYS:  ABC scale 6.3%  COGNITION: Overall cognitive status: Within functional limits for tasks assessed     SENSATION: Not tested   LOWER EXTREMITY ROM:  Active ROM Right eval Left eval  Hip flexion WNL WNL  Hip extension WNL -5  Hip abduction 40 40  Hip adduction    Hip internal rotation    Hip external rotation    Knee flexion    Knee extension    Ankle dorsiflexion    Ankle plantarflexion    Ankle inversion    Ankle eversion     (Blank rows = not tested)  LOWER EXTREMITY MMT:  MMT Right eval Left eval  Hip flexion 4+ 4  Hip extension 4 4  Hip abduction 4+ 4+  Hip adduction    Hip internal rotation    Hip external rotation    Knee flexion 4+   Knee extension 4+   Ankle dorsiflexion    Ankle plantarflexion    Ankle inversion    Ankle eversion     (Blank rows = not tested)   FUNCTIONAL TASKS:  W/C to bed transfer: CGA with sit to stand, pt presents with heavy forward lean and weight shift toward RLE, heavy upper limb push on FWW once pt has hands on walker. Heavy verbal cueing to push from seat versus walker for transferring.  Sit to stand: Difficulty initiating with slowed velocity of first 25% of transfer following lift from mat.   GAIT: Distance walked: 60 ft Assistive device utilized: Walker - 2 wheeled Level of assistance: CGA Comments: Heavy upper limb support on walker. Step-to pattern with RLE leading. Decreased terminal knee extension of L prosthesis at initial contact that can be partially corrected with verbal cueing for swing technique. Decreased LLE weight shift during stance phase. Decreased hip extension during mid-stance to terminal stance.  TREATMENT DATE: 07/14/2023    SUBJECTIVE STATEMENT:   Patient sustained fall since her last visit while her sister was pulling on front of her wheelchair at home. W/C tipped backward and pt fell directly onto back/back of head. Pt reports no HA, N&V, or dizziness at this time. She has not seen urgent care/ED or MD since this happened. She reports feeling generally well today without new pain. She feels that mirror therapy has helped with phantom pain.     Therapeutic Activities - patient education, repetitive task practice for improved performance of daily functional activities e.g. transferring  Gait belt and CGA utilized during all activities*  Bilateral stance in // bars without UE support with emphasis on bilateral weightbearing without asymmetrical weight shift to RLE (BKA leg)   -reviewed with mirror feedback  Forward stepping in bars with bilateral prosthetic feet straddling blue line to promote symmetrical weight shift; 4x D/B length of bars with 180-degree turn on either end   Gait in rehab gym x 2 laps; x 160' with PT CGA  -verbal cueing for achieving L AKA prosthesis terminal knee extension  In // bars: Toe tapping on 6-inch step with prostheses, alternating R/L; 2x10  Sit to stand task practice with bilat push on armrest and controlled descent with upper limb assist; x 5  Minisquat with bilateral UE support in bars, to practice unlocking L AKA prosthesis and achieving terminal extension during sit to stand/stand to sit; 1 x 10     *next visit* Weight shifting with bilateral symmetrical stance and no UE support alternating R/L   PATIENT EDUCATION:  Education details: see above for patient education details Person educated: Patient Education method: Explanation, Demonstration, and Handouts Education comprehension: verbalized understanding  and returned demonstration  HOME EXERCISE PROGRAM: Access Code: [email or web address] URL: [email or web address] Date: 06/23/2023 Prepared by: Consuela Mimes  Exercises - Prone Hip Flexor Stretch with Towel Roll (AKA)  - 2-3 x daily - 7 x weekly - 1 sets - 2-3 min hold - Supine Hip Abduction  - 2 x daily - 7 x weekly - 2 sets - 10 reps - Sidelying Hip Abduction (AKA)  - 2 x daily - 7 x weekly - 2 sets - 10 reps - Prone Hip Extension with Residual Limb (AKA)  - 2 x daily - 7 x weekly - 2 sets - 10 reps  ASSESSMENT:  CLINICAL IMPRESSION: Patient demonstrates improving L prosthetic knee extension at terminal swing and improved ability to shift weight onto LLE. Pt still biases weightbearing toward R below-knee prosthesis (utilized since 2020) as expected. We worked on symmetrical weightbearing and weight-shifting with Banker. Pt is still notably challenged with sit to stand transfers and relies on heavy L-sided weight shift and heavy UE support to perform transfer. Pt exhibits improving independence with prosthetic gait, and needs further work on improving asymmetries and gait stability. Patient presents with prosthetic gait deviations, hip weakness, imbalance, and difficulty with weight shifting/transfers. Pt will continue to benefit from skilled PT services to address deficits and improve function.  OBJECTIVE IMPAIRMENTS: Abnormal gait, decreased balance, decreased mobility, difficulty walking, decreased strength, prosthetic dependency , and pain.   ACTIVITY LIMITATIONS: carrying, standing, stairs, transfers, bed mobility, and locomotion level  PARTICIPATION LIMITATIONS: cleaning, laundry, shopping, and community activity  PERSONAL FACTORS: Age, Past/current experiences, and 3+ comorbidities: (anxiety, depression, CKD, COPD, DM, DKA, HTN lower extremity ischemia)  are also affecting patient's functional outcome.   REHAB POTENTIAL: Good  CLINICAL DECISION MAKING:  Evolving/moderate complexity  EVALUATION  COMPLEXITY: Moderate   GOALS: Goals reviewed with patient? Yes  SHORT TERM GOALS: Target date: 07/14/23 Patient will be independent and 100% compliant with HEP as needed for carryover of PT intervention Baseline: 06/21/23: Baseline HEP reviewed.  Goal status: INITIAL  2.  Patient will attain L hip hyperextension to 15 deg indicative of improved ROM required for terminal stance/heel off during gait Baseline: 06/21/23: L hip extension -5 Goal status: INITIAL  3.  Patient will demonstrate modified-independent sit to stand and stand-pivot transfer from W/C to adjacent sitting surface with no LOB and no verbal cueing required from therapist for technique Baseline: 06/21/23: Difficulty and some cueing needed for safety with transferring and stand-pivot transfer Goal status: INITIAL   LONG TERM GOALS: Target date: 09/01/23  Patient will ambulate at community-level distance with front-wheeled walker ModI with no LOB and normalized hip extension and swing through for L above-knee prosthesis Baseline: 06/21/23: Noted gait deviations and pt ambulating short of household distance with L AKA prosthesis Goal status: INITIAL  2.  Pt will improve ABC Scale by 13% or greater indicative of improved confidence related to balance and fall risk and clinically meaningful change Baseline: 06/21/23: ABC Scale to be obtained on visit # 2.   06/27/23: 6.3% Goal status: INITIAL  3.  Pt will perform TUG in less than 20 seconds indicative of decreased risk of falls relative to patient-specific population Baseline: 06/21/23: TUG to be completed on visit # 2.   06/27/23: 136 sec Goal status: INITIAL  4.  Pt will improve Amputee Mobility Predictor by 4 points or greater indicative of detectable change in ADLs and functional mobility with prostheses.  Baseline: 06/21/23: AMPPRO to be completed on visits #2-3.  07/06/23: 23/47 (K2 category).  Goal status: INITIAL  5.  Patient will  negotiate 3 steps ModI with FWW as needed for entering/exiting her home. Baseline: 06/21/23: Significant difficulty with stair negotiation.  Goal status: INITIAL    PLAN:  PT FREQUENCY: 2x/week  PT DURATION: 10 weeks  PLANNED INTERVENTIONS: 97164- PT Re-evaluation, 97110-Therapeutic exercises, 97530- Therapeutic activity, [id number]- Neuromuscular re-education, [phone]- Self Care, 69629- Prosthetic training, Patient/Family education, and Balance training  PLAN FOR NEXT SESSION: Continue with weight shifting and gait training with new L AKA prosthesis, correcting gait deviations. Open-chain strengthening for BLE.    Consuela Mimes, PT, DPT #[id number]  Gertie Exon, PT 07/14/2023, 12:36 PM

## 2023-07-18 NOTE — Therapy (Unsigned)
 OUTPATIENT PHYSICAL THERAPY TREATMENT    Patient Name: Sylvia [name] MRN: [id number] DOB:19-Feb-1956, 68 y.o., female Today's Date: 07/19/2023  END OF SESSION:  PT End of Session - 07/19/23 1231     Visit Number 6    Number of Visits 21    Date for PT Re-Evaluation 09/01/23    Authorization Type [employer]    PT Start Time 0910    PT Stop Time 0948    PT Time Calculation (min) 38 min    Equipment Utilized During Treatment Gait belt   patient's personal FWW   Activity Tolerance Patient limited by fatigue;Patient tolerated treatment well   c/o bilat phantom limb pain   Behavior During Therapy WFL for tasks assessed/performed               Past Medical History:  Diagnosis Date   Anemia of chronic disease 05/16/2019   Anxiety    h/o   Arthritis    Chronic kidney disease    Cocaine abuse (HCC)    COPD (chronic obstructive pulmonary disease) (HCC)    Coronary artery disease    Depression    Diabetes mellitus without complication (HCC)    DKA (diabetic ketoacidosis) (HCC)    Elevated troponin    GERD (gastroesophageal reflux disease)    GI bleed    Hypertension    bp under control-off meds since 2019   Ischemic leg    Past Surgical History:  Procedure Laterality Date   ABDOMINAL HYSTERECTOMY     AMPUTATION Right 12/27/2018   Procedure: AMPUTATION BELOW KNEE;  Surgeon: Annice Needy, MD;  Location: [facility];  Service: General;  Laterality: Right;   AMPUTATION Left 11/05/2021   Procedure: AMPUTATION BELOW KNEE;  Surgeon: Annice Needy, MD;  Location: [facility];  Service: Vascular;  Laterality: Left;   AMPUTATION Left 11/04/2022   Procedure: AMPUTATION ABOVE KNEE;  Surgeon: Annice Needy, MD;  Location: [facility];  Service: Vascular;  Laterality: Left;   APPLICATION OF WOUND VAC  10/16/2021   Procedure: APPLICATION OF WOUND VAC;  Surgeon: Annice Needy, MD;  Location: [facility];  Service: Vascular;;   CATARACT EXTRACTION W/PHACO Right 03/27/2020   Procedure: CATARACT  EXTRACTION PHACO AND INTRAOCULAR LENS PLACEMENT (IOC) RIGHT DIABETIC 7.54 00:52.5;  Surgeon: Galen Manila, MD;  Location: [facility];  Service: Ophthalmology;  Laterality: Right;   CATARACT EXTRACTION W/PHACO Left 05/20/2020   Procedure: CATARACT EXTRACTION PHACO AND INTRAOCULAR LENS PLACEMENT (IOC) LEFT DIABETIC 4.95 00:37.6;  Surgeon: Galen Manila, MD;  Location: [facility];  Service: Ophthalmology;  Laterality: Left;  Diabetic - oral meds COVID + 04-24-20   CHOLECYSTECTOMY     COLONOSCOPY WITH PROPOFOL N/A 04/16/2019   Procedure: COLONOSCOPY WITH PROPOFOL;  Surgeon: Pasty Spillers, MD;  Location: [facility];  Service: Endoscopy;  Laterality: N/A;   COLONOSCOPY WITH PROPOFOL N/A 01/16/2020   Procedure: COLONOSCOPY WITH PROPOFOL;  Surgeon: Pasty Spillers, MD;  Location: [facility];  Service: Endoscopy;  Laterality: N/A;   ENDARTERECTOMY FEMORAL Left 10/16/2021   Procedure: ENDARTERECTOMY FEMORAL;  Surgeon: Annice Needy, MD;  Location: [facility];  Service: Vascular;  Laterality: Left;   ESOPHAGOGASTRODUODENOSCOPY (EGD) WITH PROPOFOL N/A 06/15/2018   Procedure: ESOPHAGOGASTRODUODENOSCOPY (EGD) WITH PROPOFOL;  Surgeon: Toledo, Boykin Nearing, MD;  Location: [facility];  Service: Gastroenterology;  Laterality: N/A;   ESOPHAGOGASTRODUODENOSCOPY (EGD) WITH PROPOFOL N/A 01/03/2019   Procedure: ESOPHAGOGASTRODUODENOSCOPY (EGD) WITH PROPOFOL;  Surgeon: Midge Minium, MD;  Location: [facility];  Service: Endoscopy;  Laterality: N/A;   ESOPHAGOGASTRODUODENOSCOPY (EGD) WITH PROPOFOL N/A 09/25/2021   Procedure: ESOPHAGOGASTRODUODENOSCOPY (EGD) WITH PROPOFOL;  Surgeon: Midge Minium, MD;  Location: [facility];  Service: Endoscopy;  Laterality: N/A;   EYE SURGERY Bilateral    GIVENS CAPSULE STUDY  04/16/2019   Procedure: GIVENS CAPSULE STUDY;  Surgeon: Pasty Spillers, MD;  Location: [facility];  Service: Endoscopy;;   GIVENS CAPSULE STUDY N/A  06/25/2019   Procedure: GIVENS CAPSULE STUDY;  Surgeon: Wyline Mood, MD;  Location: [facility];  Service: Gastroenterology;  Laterality: N/A;   INCISION AND DRAINAGE OF WOUND Left 02/24/2022   Procedure: LEFT BKA IRRIGATION AND DEBRIDEMENT WOUND;  Surgeon: Annice Needy, MD;  Location: [facility];  Service: Vascular;  Laterality: Left;   LOWER EXTREMITY ANGIOGRAPHY Right 08/21/2018   Procedure: LOWER EXTREMITY ANGIOGRAPHY;  Surgeon: Annice Needy, MD;  Location: [facility];  Service: Cardiovascular;  Laterality: Right;   LOWER EXTREMITY ANGIOGRAPHY Left 08/28/2018   Procedure: LOWER EXTREMITY ANGIOGRAPHY;  Surgeon: Annice Needy, MD;  Location: [facility];  Service: Cardiovascular;  Laterality: Left;   LOWER EXTREMITY ANGIOGRAPHY Right 08/28/2018   Procedure: Lower Extremity Angiography;  Surgeon: Annice Needy, MD;  Location: [facility];  Service: Cardiovascular;  Laterality: Right;   LOWER EXTREMITY ANGIOGRAPHY Right 12/18/2018   Procedure: Lower Extremity Angiography;  Surgeon: Annice Needy, MD;  Location: [facility];  Service: Cardiovascular;  Laterality: Right;   LOWER EXTREMITY ANGIOGRAPHY Right 12/18/2018   Procedure: Lower Extremity Angiography;  Surgeon: Annice Needy, MD;  Location: [facility];  Service: Cardiovascular;  Laterality: Right;   LOWER EXTREMITY ANGIOGRAPHY Left 12/21/2018   Procedure: Lower Extremity Angiography;  Surgeon: Annice Needy, MD;  Location: [facility];  Service: Cardiovascular;  Laterality: Left;   LOWER EXTREMITY ANGIOGRAPHY Right 12/21/2018   Procedure: Lower Extremity Angiography;  Surgeon: Annice Needy, MD;  Location: [facility];  Service: Cardiovascular;  Laterality: Right;   LOWER EXTREMITY ANGIOGRAPHY Left 12/25/2020   Procedure: LOWER EXTREMITY ANGIOGRAPHY;  Surgeon: Annice Needy, MD;  Location: [facility];  Service: Cardiovascular;  Laterality: Left;   LOWER EXTREMITY  ANGIOGRAPHY Left 09/21/2021   Procedure: Lower Extremity Angiography;  Surgeon: Annice Needy, MD;  Location: [facility];  Service: Cardiovascular;  Laterality: Left;   LOWER EXTREMITY ANGIOGRAPHY Left 10/13/2021   Procedure: Lower Extremity Angiography;  Surgeon: Renford Dills, MD;  Location: [facility];  Service: Cardiovascular;  Laterality: Left;   LOWER EXTREMITY ANGIOGRAPHY Left 10/14/2021   Procedure: Lower Extremity Angiography;  Surgeon: Annice Needy, MD;  Location: [facility];  Service: Cardiovascular;  Laterality: Left;   LOWER EXTREMITY ANGIOGRAPHY Left 11/02/2021   Procedure: Lower Extremity Angiography;  Surgeon: Annice Needy, MD;  Location: [facility];  Service: Cardiovascular;  Laterality: Left;   LOWER EXTREMITY INTERVENTION N/A 12/22/2018   Procedure: LOWER EXTREMITY INTERVENTION;  Surgeon: Annice Needy, MD;  Location: [facility];  Service: Cardiovascular;  Laterality: N/A;   LOWER EXTREMITY INTERVENTION Left 12/26/2020   Procedure: LOWER EXTREMITY INTERVENTION;  Surgeon: Annice Needy, MD;  Location: [facility];  Service: Cardiovascular;  Laterality: Left;   LOWER EXTREMITY INTERVENTION N/A 09/22/2021   Procedure: LOWER EXTREMITY INTERVENTION;  Surgeon: Annice Needy, MD;  Location: [facility];  Service: Cardiovascular;  Laterality: N/A;   Patient Active Problem List  Diagnosis Date Noted   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) 11/04/2022   Amputation above knee (HCC) 11/04/2022   Cellulitis of left lower extremity 02/23/2022   Sepsis due to cellulitis (HCC) 02/23/2022   Dyslipidemia 02/23/2022   Rheumatoid arthritis (HCC) 02/23/2022   Essential hypertension 02/23/2022   Type 2 diabetes mellitus with peripheral neuropathy (HCC) 02/23/2022   Multiple duodenal ulcers    Ischemic leg 12/25/2020   Rheumatoid arthritis of 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)  08/13/2020   Diabetes mellitus without complication (HCC)    GERD (gastroesophageal reflux disease)    COPD (chronic obstructive pulmonary disease) (HCC)    GIB (gastrointestinal bleeding)    HLD (hyperlipidemia)    Depression    Elevated troponin    Syncope    Elevated troponin I level    Anemia of chronic disease 05/16/2019   Dizziness    Polyp of colon    Acute upper GI bleeding 04/13/2019   Anemia associated with acute blood loss 04/13/2019   Chronic anticoagulation 04/13/2019   Hx of right BKA (HCC) 04/13/2019   Symptomatic anemia 04/13/2019   Upper GI bleed 04/13/2019   Acute GI bleeding 04/13/2019   Phantom pain after amputation of lower extremity (HCC) 01/30/2019   Melena    DU (duodenal ulcer)    DKA (diabetic ketoacidosis) (HCC) 12/14/2018   Atherosclerosis of artery of extremity with rest pain (HCC) 08/28/2018   PAD (peripheral artery disease) (HCC) 07/14/2018   Abdominal pain 06/13/2018   HTN (hypertension) 06/13/2018   Non-insulin dependent type 2 diabetes mellitus (HCC) 06/13/2018   Elevated lactic acid level 06/13/2018    PCP: [facility]  REFERRING PROVIDER: Georgiana Spinner, NP  REFERRING DIAG:  [id number] (ICD-10-CM) - Hx of right BKA (HCC)  Z89.512 (ICD-10-CM) - Hx of BKA, left (HCC)    THERAPY DIAG:  Difficulty in walking, not elsewhere classified  Muscle weakness (generalized)  Imbalance  History of amputation below knee, right (HCC)  History of above knee amputation, left (HCC)  Rationale for Evaluation and Treatment: Rehabilitation  ONSET DATE: L AKA 11/04/22   SUBJECTIVE:   SUBJECTIVE STATEMENT: Pt is a 68 year old female s/p L AKA 11/04/22. Pt has received above-knee prosthesis for L. Pt had R BKA in Sept 2020 with prosthetic rehab in this clinic. Pt is proficient with R below-knee prosthesis.  PERTINENT HISTORY: Patient reports standing with her prosthesis at home with her sister and walking with it some at  home. Pt reports dealing with phantom limb pain. Patient reports getting this in R BKA and L AKA limbs. Patient reports Gabapentin helps to control phantom limb symptoms. Pt had R BKA 4-5 years ago. Pt reports some tenderness along end of L residual limb. Pt reports that she has not used her prosthesis much over previous 2 weeks. She reports using shrinker each day and completing regular checks on skin integrity for L residual limb.  Type of amputation: Above-knee amputation  Onset: L AKA 11/04/22 Follow-up appt with MD: Pt following up with Georgiana Spinner for check-up between May and June Pain: 710 Present, 10/10 Worst: (Pain along L hip flexor/L groin) Phantom pain: Yes History of previous amputations/PAD: Yes; Hx of R BKA  Pain quality: pain quality: sharp Numbness/Tingling: Yes Imaging: No    PRECAUTIONS: None  RED FLAGS: Increased urinary urgency and functional incontinence, unknown etiology Disturbed sleep lying on L side, modified with change in position  WEIGHT BEARING RESTRICTIONS: No  FALLS:  Has patient fallen in last 6 months? No  LIVING ENVIRONMENT: Lives with:  sister, great granddaughter 45 y/o, significant other  Lives in: House/apartment; 3 steps in front to get into home; temporary living until pt finds home with flat entryway;  Shower chair in bathroom; pt able to get in/out of bathroom Stairs:  stairs into basement only, pt does not go there often Has following equipment at home: Dan Humphreys - 2 wheeled, Wheelchair (power), Wheelchair (manual), shower chair, and Grab bars; sliding board for W/C transfers  OCCUPATION: N/A  PLOF: Independent with household mobility with device  PATIENT GOALS: Get back to walking and improved independence with L AKA prosthesis  NEXT MD VISIT: Between May-June   OBJECTIVE:  Note: Objective measures were completed at Evaluation unless otherwise noted.  MUSCULOSKELETAL: Tremor: Absent Tone: Normal, no spasticity, rigidity, or  clonus Residual limb appears well-healing with no signs of erythema, skin breakdown, infection, or drainage  Posture Forward flexed posture in static standing  Palpation No pain to palpation along distal surface of residual limb.  No pain with palpation to quadriceps or hamstrings.    PATIENT SURVEYS:  ABC scale 6.3%  COGNITION: Overall cognitive status: Within functional limits for tasks assessed     SENSATION: Not tested   LOWER EXTREMITY ROM:  Active ROM Right eval Left eval  Hip flexion WNL WNL  Hip extension WNL -5  Hip abduction 40 40  Hip adduction    Hip internal rotation    Hip external rotation    Knee flexion    Knee extension    Ankle dorsiflexion    Ankle plantarflexion    Ankle inversion    Ankle eversion     (Blank rows = not tested)  LOWER EXTREMITY MMT:  MMT Right eval Left eval  Hip flexion 4+ 4  Hip extension 4 4  Hip abduction 4+ 4+  Hip adduction    Hip internal rotation    Hip external rotation    Knee flexion 4+   Knee extension 4+   Ankle dorsiflexion    Ankle plantarflexion    Ankle inversion    Ankle eversion     (Blank rows = not tested)   FUNCTIONAL TASKS:  W/C to bed transfer: CGA with sit to stand, pt presents with heavy forward lean and weight shift toward RLE, heavy upper limb push on FWW once pt has hands on walker. Heavy verbal cueing to push from seat versus walker for transferring.  Sit to stand: Difficulty initiating with slowed velocity of first 25% of transfer following lift from mat.   GAIT: Distance walked: 60 ft Assistive device utilized: Walker - 2 wheeled Level of assistance: CGA Comments: Heavy upper limb support on walker. Step-to pattern with RLE leading. Decreased terminal knee extension of L prosthesis at initial contact that can be partially corrected with verbal cueing for swing technique. Decreased LLE weight shift during stance phase. Decreased hip extension during mid-stance to terminal stance.  TREATMENT DATE: 07/19/2023    SUBJECTIVE STATEMENT:   Patient is 10 minutes late due to transportation challenges. She reports compliance with HEP and practicing walking in her home with her sister with bilateral prostheses.     Therapeutic Activities - patient education, repetitive task practice for improved performance of daily functional activities e.g. transferring  Gait belt and CGA utilized during all activities*  Gait from drive-through area to lobby; then, completed gait to gym to initiate activities in // bars   In // bars: Forward stepping in bars with use of step-through gait bilaterally and bilat UE support; 4x D/B length of bars with 180-degree turn on either end   Toe tapping on 6-inch step with prostheses, alternating R/L; 2x10  Functional reach with unilateral UE light touch for balance; 8 cones - stacking and unstacking, placing on adjacent raised table; x 5 minutes  Forward step-over yard stick on floor for targeting and obstacle clearance; x10 with either LE    Practiced transfer from W/C to passenger seat of car; stand pivot with BUE support on car seat and car door; PT CGA   *next visit* Gait in rehab gym x 2 laps; x 160' with PT CGA  -verbal cueing for achieving L AKA prosthesis terminal knee extension Weight shifting with bilateral symmetrical stance and no UE support alternating R/L   PATIENT EDUCATION:  Education details: see above for patient education details Person educated: Patient Education method: Explanation, Demonstration, and Handouts Education comprehension: verbalized understanding and returned demonstration  HOME EXERCISE PROGRAM: Access Code: [email or web address] URL: [email or web address] Date: 06/23/2023 Prepared by: Consuela Mimes  Exercises - Prone Hip Flexor Stretch with Towel Roll (AKA)  - 2-3 x  daily - 7 x weekly - 1 sets - 2-3 min hold - Supine Hip Abduction  - 2 x daily - 7 x weekly - 2 sets - 10 reps - Sidelying Hip Abduction (AKA)  - 2 x daily - 7 x weekly - 2 sets - 10 reps - Prone Hip Extension with Residual Limb (AKA)  - 2 x daily - 7 x weekly - 2 sets - 10 reps  ASSESSMENT:  CLINICAL IMPRESSION: Session today is slightly abbreviated due to late arrival time due to transportation difficulties. Patient participates well with physical therapy and exhibits improving gait pattern with L above-knee prosthesis with improved knee extension at terminal swing and initial contact. Pt is better able to target bilateral prosthesis with toe tapping and step-over task. Patient presents with prosthetic gait deviations, hip weakness, imbalance, and difficulty with weight shifting/transfers. Pt will continue to benefit from skilled PT services to address deficits and improve function.  OBJECTIVE IMPAIRMENTS: Abnormal gait, decreased balance, decreased mobility, difficulty walking, decreased strength, prosthetic dependency , and pain.   ACTIVITY LIMITATIONS: carrying, standing, stairs, transfers, bed mobility, and locomotion level  PARTICIPATION LIMITATIONS: cleaning, laundry, shopping, and community activity  PERSONAL FACTORS: Age, Past/current experiences, and 3+ comorbidities: (anxiety, depression, CKD, COPD, DM, DKA, HTN lower extremity ischemia)  are also affecting patient's functional outcome.   REHAB POTENTIAL: Good  CLINICAL DECISION MAKING: Evolving/moderate complexity  EVALUATION COMPLEXITY: Moderate   GOALS: Goals reviewed with patient? Yes  SHORT TERM GOALS: Target date: 07/14/23 Patient will be independent and 100% compliant with HEP as needed for carryover of PT intervention Baseline: 06/21/23: Baseline HEP reviewed.  Goal status: INITIAL  2.  Patient will attain L hip hyperextension to 15 deg indicative of improved ROM required for terminal stance/heel off during  gait Baseline: 06/21/23: L hip  extension -5 Goal status: INITIAL  3.  Patient will demonstrate modified-independent sit to stand and stand-pivot transfer from W/C to adjacent sitting surface with no LOB and no verbal cueing required from therapist for technique Baseline: 06/21/23: Difficulty and some cueing needed for safety with transferring and stand-pivot transfer Goal status: INITIAL   LONG TERM GOALS: Target date: 09/01/23  Patient will ambulate at community-level distance with front-wheeled walker ModI with no LOB and normalized hip extension and swing through for L above-knee prosthesis Baseline: 06/21/23: Noted gait deviations and pt ambulating short of household distance with L AKA prosthesis Goal status: INITIAL  2.  Pt will improve ABC Scale by 13% or greater indicative of improved confidence related to balance and fall risk and clinically meaningful change Baseline: 06/21/23: ABC Scale to be obtained on visit # 2.   06/27/23: 6.3% Goal status: INITIAL  3.  Pt will perform TUG in less than 20 seconds indicative of decreased risk of falls relative to patient-specific population Baseline: 06/21/23: TUG to be completed on visit # 2.   06/27/23: 136 sec Goal status: INITIAL  4.  Pt will improve Amputee Mobility Predictor by 4 points or greater indicative of detectable change in ADLs and functional mobility with prostheses.  Baseline: 06/21/23: AMPPRO to be completed on visits #2-3.  07/06/23: 23/47 (K2 category).  Goal status: INITIAL  5.  Patient will negotiate 3 steps ModI with FWW as needed for entering/exiting her home. Baseline: 06/21/23: Significant difficulty with stair negotiation.  Goal status: INITIAL    PLAN:  PT FREQUENCY: 2x/week  PT DURATION: 10 weeks  PLANNED INTERVENTIONS: 97164- PT Re-evaluation, 97110-Therapeutic exercises, 97530- Therapeutic activity, [id number]- Neuromuscular re-education, [phone]- Self Care, [id number]- Prosthetic training, Patient/Family education, and Balance  training  PLAN FOR NEXT SESSION: Continue with weight shifting and gait training with new L AKA prosthesis, correcting gait deviations. Open-chain strengthening for BLE.    Consuela Mimes, PT, DPT #[id number]  Gertie Exon, PT 07/19/2023, 12:32 PM

## 2023-07-19 ENCOUNTER — Ambulatory Visit: Payer: 59 | Attending: Nurse Practitioner | Admitting: Physical Therapy

## 2023-07-19 DIAGNOSIS — Z89612 Acquired absence of left leg above knee: Secondary | ICD-10-CM | POA: Insufficient documentation

## 2023-07-19 DIAGNOSIS — R262 Difficulty in walking, not elsewhere classified: Secondary | ICD-10-CM | POA: Insufficient documentation

## 2023-07-19 DIAGNOSIS — Z89511 Acquired absence of right leg below knee: Secondary | ICD-10-CM | POA: Diagnosis present

## 2023-07-19 DIAGNOSIS — R2689 Other abnormalities of gait and mobility: Secondary | ICD-10-CM | POA: Insufficient documentation

## 2023-07-19 DIAGNOSIS — M6281 Muscle weakness (generalized): Secondary | ICD-10-CM | POA: Diagnosis present

## 2023-07-21 ENCOUNTER — Ambulatory Visit: Payer: 59 | Admitting: Physical Therapy

## 2023-07-21 DIAGNOSIS — R262 Difficulty in walking, not elsewhere classified: Secondary | ICD-10-CM

## 2023-07-21 DIAGNOSIS — Z89612 Acquired absence of left leg above knee: Secondary | ICD-10-CM

## 2023-07-21 DIAGNOSIS — Z89511 Acquired absence of right leg below knee: Secondary | ICD-10-CM

## 2023-07-21 DIAGNOSIS — R2689 Other abnormalities of gait and mobility: Secondary | ICD-10-CM

## 2023-07-21 DIAGNOSIS — M6281 Muscle weakness (generalized): Secondary | ICD-10-CM

## 2023-07-21 NOTE — Therapy (Signed)
 OUTPATIENT PHYSICAL THERAPY TREATMENT   Patient Name: Sylvia [name] MRN: [id number] DOB:Sep 07, 1955, 68 y.o., female Today's Date: 07/21/2023  END OF SESSION:  PT End of Session - 07/21/23 0959     Visit Number 7    Number of Visits 21    Date for PT Re-Evaluation 09/01/23    Authorization Type [employer]    PT Start Time 0901    PT Stop Time 0947    PT Time Calculation (min) 46 min    Equipment Utilized During Treatment Gait belt   patient's personal FWW   Activity Tolerance Patient limited by fatigue;Patient tolerated treatment well   c/o bilat phantom limb pain   Behavior During Therapy WFL for tasks assessed/performed                Past Medical History:  Diagnosis Date   Anemia of chronic disease 05/16/2019   Anxiety    h/o   Arthritis    Chronic kidney disease    Cocaine abuse (HCC)    COPD (chronic obstructive pulmonary disease) (HCC)    Coronary artery disease    Depression    Diabetes mellitus without complication (HCC)    DKA (diabetic ketoacidosis) (HCC)    Elevated troponin    GERD (gastroesophageal reflux disease)    GI bleed    Hypertension    bp under control-off meds since 2019   Ischemic leg    Past Surgical History:  Procedure Laterality Date   ABDOMINAL HYSTERECTOMY     AMPUTATION Right 12/27/2018   Procedure: AMPUTATION BELOW KNEE;  Surgeon: Annice Needy, MD;  Location: [facility];  Service: General;  Laterality: Right;   AMPUTATION Left 11/05/2021   Procedure: AMPUTATION BELOW KNEE;  Surgeon: Annice Needy, MD;  Location: [facility];  Service: Vascular;  Laterality: Left;   AMPUTATION Left 11/04/2022   Procedure: AMPUTATION ABOVE KNEE;  Surgeon: Annice Needy, MD;  Location: [facility];  Service: Vascular;  Laterality: Left;   APPLICATION OF WOUND VAC  10/16/2021   Procedure: APPLICATION OF WOUND VAC;  Surgeon: Annice Needy, MD;  Location: [facility];  Service: Vascular;;   CATARACT EXTRACTION W/PHACO Right 03/27/2020   Procedure: CATARACT  EXTRACTION PHACO AND INTRAOCULAR LENS PLACEMENT (IOC) RIGHT DIABETIC 7.54 00:52.5;  Surgeon: Galen Manila, MD;  Location: [facility];  Service: Ophthalmology;  Laterality: Right;   CATARACT EXTRACTION W/PHACO Left 05/20/2020   Procedure: CATARACT EXTRACTION PHACO AND INTRAOCULAR LENS PLACEMENT (IOC) LEFT DIABETIC 4.95 00:37.6;  Surgeon: Galen Manila, MD;  Location: [facility];  Service: Ophthalmology;  Laterality: Left;  Diabetic - oral meds COVID + 04-24-20   CHOLECYSTECTOMY     COLONOSCOPY WITH PROPOFOL N/A 04/16/2019   Procedure: COLONOSCOPY WITH PROPOFOL;  Surgeon: Pasty Spillers, MD;  Location: [facility];  Service: Endoscopy;  Laterality: N/A;   COLONOSCOPY WITH PROPOFOL N/A 01/16/2020   Procedure: COLONOSCOPY WITH PROPOFOL;  Surgeon: Pasty Spillers, MD;  Location: [facility];  Service: Endoscopy;  Laterality: N/A;   ENDARTERECTOMY FEMORAL Left 10/16/2021   Procedure: ENDARTERECTOMY FEMORAL;  Surgeon: Annice Needy, MD;  Location: [facility];  Service: Vascular;  Laterality: Left;   ESOPHAGOGASTRODUODENOSCOPY (EGD) WITH PROPOFOL N/A 06/15/2018   Procedure: ESOPHAGOGASTRODUODENOSCOPY (EGD) WITH PROPOFOL;  Surgeon: Toledo, Boykin Nearing, MD;  Location: [facility];  Service: Gastroenterology;  Laterality: N/A;   ESOPHAGOGASTRODUODENOSCOPY (EGD) WITH PROPOFOL N/A 01/03/2019   Procedure: ESOPHAGOGASTRODUODENOSCOPY (EGD) WITH PROPOFOL;  Surgeon: Midge Minium, MD;  Location: [facility];  Service: Endoscopy;  Laterality: N/A;   ESOPHAGOGASTRODUODENOSCOPY (EGD) WITH PROPOFOL N/A 09/25/2021   Procedure: ESOPHAGOGASTRODUODENOSCOPY (EGD) WITH PROPOFOL;  Surgeon: Midge Minium, MD;  Location: [facility];  Service: Endoscopy;  Laterality: N/A;   EYE SURGERY Bilateral    GIVENS CAPSULE STUDY  04/16/2019   Procedure: GIVENS CAPSULE STUDY;  Surgeon: Pasty Spillers, MD;  Location: [facility];  Service: Endoscopy;;   GIVENS CAPSULE STUDY N/A  06/25/2019   Procedure: GIVENS CAPSULE STUDY;  Surgeon: Wyline Mood, MD;  Location: [facility];  Service: Gastroenterology;  Laterality: N/A;   INCISION AND DRAINAGE OF WOUND Left 02/24/2022   Procedure: LEFT BKA IRRIGATION AND DEBRIDEMENT WOUND;  Surgeon: Annice Needy, MD;  Location: [facility];  Service: Vascular;  Laterality: Left;   LOWER EXTREMITY ANGIOGRAPHY Right 08/21/2018   Procedure: LOWER EXTREMITY ANGIOGRAPHY;  Surgeon: Annice Needy, MD;  Location: [facility];  Service: Cardiovascular;  Laterality: Right;   LOWER EXTREMITY ANGIOGRAPHY Left 08/28/2018   Procedure: LOWER EXTREMITY ANGIOGRAPHY;  Surgeon: Annice Needy, MD;  Location: [facility];  Service: Cardiovascular;  Laterality: Left;   LOWER EXTREMITY ANGIOGRAPHY Right 08/28/2018   Procedure: Lower Extremity Angiography;  Surgeon: Annice Needy, MD;  Location: [facility];  Service: Cardiovascular;  Laterality: Right;   LOWER EXTREMITY ANGIOGRAPHY Right 12/18/2018   Procedure: Lower Extremity Angiography;  Surgeon: Annice Needy, MD;  Location: [facility];  Service: Cardiovascular;  Laterality: Right;   LOWER EXTREMITY ANGIOGRAPHY Right 12/18/2018   Procedure: Lower Extremity Angiography;  Surgeon: Annice Needy, MD;  Location: [facility];  Service: Cardiovascular;  Laterality: Right;   LOWER EXTREMITY ANGIOGRAPHY Left 12/21/2018   Procedure: Lower Extremity Angiography;  Surgeon: Annice Needy, MD;  Location: [facility];  Service: Cardiovascular;  Laterality: Left;   LOWER EXTREMITY ANGIOGRAPHY Right 12/21/2018   Procedure: Lower Extremity Angiography;  Surgeon: Annice Needy, MD;  Location: [facility];  Service: Cardiovascular;  Laterality: Right;   LOWER EXTREMITY ANGIOGRAPHY Left 12/25/2020   Procedure: LOWER EXTREMITY ANGIOGRAPHY;  Surgeon: Annice Needy, MD;  Location: [facility];  Service: Cardiovascular;  Laterality: Left;   LOWER EXTREMITY  ANGIOGRAPHY Left 09/21/2021   Procedure: Lower Extremity Angiography;  Surgeon: Annice Needy, MD;  Location: [facility];  Service: Cardiovascular;  Laterality: Left;   LOWER EXTREMITY ANGIOGRAPHY Left 10/13/2021   Procedure: Lower Extremity Angiography;  Surgeon: Renford Dills, MD;  Location: [facility];  Service: Cardiovascular;  Laterality: Left;   LOWER EXTREMITY ANGIOGRAPHY Left 10/14/2021   Procedure: Lower Extremity Angiography;  Surgeon: Annice Needy, MD;  Location: [facility];  Service: Cardiovascular;  Laterality: Left;   LOWER EXTREMITY ANGIOGRAPHY Left 11/02/2021   Procedure: Lower Extremity Angiography;  Surgeon: Annice Needy, MD;  Location: [facility];  Service: Cardiovascular;  Laterality: Left;   LOWER EXTREMITY INTERVENTION N/A 12/22/2018   Procedure: LOWER EXTREMITY INTERVENTION;  Surgeon: Annice Needy, MD;  Location: [facility];  Service: Cardiovascular;  Laterality: N/A;   LOWER EXTREMITY INTERVENTION Left 12/26/2020   Procedure: LOWER EXTREMITY INTERVENTION;  Surgeon: Annice Needy, MD;  Location: [facility];  Service: Cardiovascular;  Laterality: Left;   LOWER EXTREMITY INTERVENTION N/A 09/22/2021   Procedure: LOWER EXTREMITY INTERVENTION;  Surgeon: Annice Needy, MD;  Location: [facility];  Service: Cardiovascular;  Laterality: N/A;   Patient Active Problem List  Diagnosis Date Noted   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) 11/04/2022   Amputation above knee (HCC) 11/04/2022   Cellulitis of left lower extremity 02/23/2022   Sepsis due to cellulitis (HCC) 02/23/2022   Dyslipidemia 02/23/2022   Rheumatoid arthritis (HCC) 02/23/2022   Essential hypertension 02/23/2022   Type 2 diabetes mellitus with peripheral neuropathy (HCC) 02/23/2022   Multiple duodenal ulcers    Ischemic leg 12/25/2020   Rheumatoid arthritis of 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)  08/13/2020   Diabetes mellitus without complication (HCC)    GERD (gastroesophageal reflux disease)    COPD (chronic obstructive pulmonary disease) (HCC)    GIB (gastrointestinal bleeding)    HLD (hyperlipidemia)    Depression    Elevated troponin    Syncope    Elevated troponin I level    Anemia of chronic disease 05/16/2019   Dizziness    Polyp of colon    Acute upper GI bleeding 04/13/2019   Anemia associated with acute blood loss 04/13/2019   Chronic anticoagulation 04/13/2019   Hx of right BKA (HCC) 04/13/2019   Symptomatic anemia 04/13/2019   Upper GI bleed 04/13/2019   Acute GI bleeding 04/13/2019   Phantom pain after amputation of lower extremity (HCC) 01/30/2019   Melena    DU (duodenal ulcer)    DKA (diabetic ketoacidosis) (HCC) 12/14/2018   Atherosclerosis of artery of extremity with rest pain (HCC) 08/28/2018   PAD (peripheral artery disease) (HCC) 07/14/2018   Abdominal pain 06/13/2018   HTN (hypertension) 06/13/2018   Non-insulin dependent type 2 diabetes mellitus (HCC) 06/13/2018   Elevated lactic acid level 06/13/2018    [facility]  REFERRING PROVIDER: Georgiana Spinner, NP  REFERRING DIAG:  [phone] (ICD-10-CM) - Hx of right BKA (HCC)  Z89.512 (ICD-10-CM) - Hx of BKA, left (HCC)    THERAPY DIAG:  Difficulty in walking, not elsewhere classified  Muscle weakness (generalized)  Imbalance  History of amputation below knee, right (HCC)  History of above knee amputation, left (HCC)  Rationale for Evaluation and Treatment: Rehabilitation  ONSET DATE: L AKA 11/04/22   SUBJECTIVE:   SUBJECTIVE STATEMENT: Pt is a 68 year old female s/p L AKA 11/04/22. Pt has received above-knee prosthesis for L. Pt had R BKA in Sept 2020 with prosthetic rehab in this clinic. Pt is proficient with R below-knee prosthesis.  PERTINENT HISTORY: Patient reports standing with her prosthesis at home with her sister and walking with it some at  home. Pt reports dealing with phantom limb pain. Patient reports getting this in R BKA and L AKA limbs. Patient reports Gabapentin helps to control phantom limb symptoms. Pt had R BKA 4-5 years ago. Pt reports some tenderness along end of L residual limb. Pt reports that she has not used her prosthesis much over previous 2 weeks. She reports using shrinker each day and completing regular checks on skin integrity for L residual limb.  Type of amputation: Above-knee amputation  Onset: L AKA 11/04/22 Follow-up appt with MD: Pt following up with Georgiana Spinner for check-up between May and June Pain: 710 Present, 10/10 Worst: (Pain along L hip flexor/L groin) Phantom pain: Yes History of previous amputations/PAD: Yes; Hx of R BKA  Pain quality: pain quality: sharp Numbness/Tingling: Yes Imaging: No    PRECAUTIONS: None  RED FLAGS: Increased urinary urgency and functional incontinence, unknown etiology Disturbed sleep lying on L side, modified with change in position  WEIGHT BEARING RESTRICTIONS: No  FALLS:  Has patient fallen in last 6 months? No  LIVING ENVIRONMENT: Lives with:  sister, great granddaughter 29 y/o, significant other  Lives in: House/apartment; 3 steps in front to get into home; temporary living until pt finds home with flat entryway;  Shower chair in bathroom; pt able to get in/out of bathroom Stairs:  stairs into basement only, pt does not go there often Has following equipment at home: Dan Humphreys - 2 wheeled, Wheelchair (power), Wheelchair (manual), shower chair, and Grab bars; sliding board for W/C transfers  OCCUPATION: N/A  PLOF: Independent with household mobility with device  PATIENT GOALS: Get back to walking and improved independence with L AKA prosthesis  NEXT MD VISIT: Between May-June   OBJECTIVE:  Note: Objective measures were completed at Evaluation unless otherwise noted.  MUSCULOSKELETAL: Tremor: Absent Tone: Normal, no spasticity, rigidity, or  clonus Residual limb appears well-healing with no signs of erythema, skin breakdown, infection, or drainage  Posture Forward flexed posture in static standing  Palpation No pain to palpation along distal surface of residual limb.  No pain with palpation to quadriceps or hamstrings.    PATIENT SURVEYS:  ABC scale 6.3%  COGNITION: Overall cognitive status: Within functional limits for tasks assessed     SENSATION: Not tested   LOWER EXTREMITY ROM:  Active ROM Right eval Left eval  Hip flexion WNL WNL  Hip extension WNL -5  Hip abduction 40 40  Hip adduction    Hip internal rotation    Hip external rotation    Knee flexion    Knee extension    Ankle dorsiflexion    Ankle plantarflexion    Ankle inversion    Ankle eversion     (Blank rows = not tested)  LOWER EXTREMITY MMT:  MMT Right eval Left eval  Hip flexion 4+ 4  Hip extension 4 4  Hip abduction 4+ 4+  Hip adduction    Hip internal rotation    Hip external rotation    Knee flexion 4+   Knee extension 4+   Ankle dorsiflexion    Ankle plantarflexion    Ankle inversion    Ankle eversion     (Blank rows = not tested)   FUNCTIONAL TASKS:  W/C to bed transfer: CGA with sit to stand, pt presents with heavy forward lean and weight shift toward RLE, heavy upper limb push on FWW once pt has hands on walker. Heavy verbal cueing to push from seat versus walker for transferring.  Sit to stand: Difficulty initiating with slowed velocity of first 25% of transfer following lift from mat.   GAIT: Distance walked: 60 ft Assistive device utilized: Walker - 2 wheeled Level of assistance: CGA Comments: Heavy upper limb support on walker. Step-to pattern with RLE leading. Decreased terminal knee extension of L prosthesis at initial contact that can be partially corrected with verbal cueing for swing technique. Decreased LLE weight shift during stance phase. Decreased hip extension during mid-stance to terminal stance.  TREATMENT DATE: 07/21/2023    SUBJECTIVE STATEMENT:   Patient arrives eager to participate in physical therapy today. No significant c/o phantom limb pain. No new complaints this AM. Pt has not experienced recent falls or near-fall incidents.    Therapeutic Activities - patient education, repetitive task practice for improved performance of daily functional activities e.g. transferring, weight transfer practice to improve static standing activities and weight shift during ambulation  Gait belt and CGA utilized during all activities*  Gait around gym; 1 lap with FWW, PT CGA  -cueing for LLE swing   In // bars: Forward stepping in bars with use of step-through gait bilaterally and bilat UE support; 5x D/B length of bars with 180-degree turn on either end   Forward step-over 2-lb ankle weight (oriented horizontally) on floor for targeting and obstacle clearance; x10 with either LE  Toe tapping on 6-inch step with prostheses, alternating R/L; 2x10  Functional reach with no UE support (intermittent contact with bar if losing balance); 8 cones - stacking and unstacking, placing on adjacent raised table; x 5 minutes  Forward gait in bars with unilateral R upper limb support, step-to pattern with LLE first; x 3 D/B length of bars  -bilat UE support for 180-degree turn at end of bars   PATIENT EDUCATION: Discussed expected progression of activities in PT and goal for gait with LRAD   *next visit* Weight shifting with bilateral symmetrical stance and no UE support alternating R/L   PATIENT EDUCATION:  Education details: see above for patient education details Person educated: Patient Education method: Explanation, Demonstration, and Handouts Education comprehension: verbalized understanding and returned demonstration  HOME EXERCISE PROGRAM: Access Code:  [email or web address] URL: [email or web address] Date: 06/23/2023 Prepared by: Consuela Mimes  Exercises - Prone Hip Flexor Stretch with Towel Roll (AKA)  - 2-3 x daily - 7 x weekly - 1 sets - 2-3 min hold - Supine Hip Abduction  - 2 x daily - 7 x weekly - 2 sets - 10 reps - Sidelying Hip Abduction (AKA)  - 2 x daily - 7 x weekly - 2 sets - 10 reps - Prone Hip Extension with Residual Limb (AKA)  - 2 x daily - 7 x weekly - 2 sets - 10 reps  ASSESSMENT:  CLINICAL IMPRESSION: Patient demonstrates significantly improved proficiency with gait c FWW. Pt has sound LLE swing to achieve near-full extension prior to initial contact of LLE prosthesis. She is able to perform static standing and reaching task with less reliance on upper extremity support. Pt is still notably challenged with weight shift to LLE with unilateral R upper limb support. Patient presents with prosthetic gait deviations, hip weakness, imbalance, and difficulty with weight shifting/transfers. Pt will continue to benefit from skilled PT services to address deficits and improve function.  OBJECTIVE IMPAIRMENTS: Abnormal gait, decreased balance, decreased mobility, difficulty walking, decreased strength, prosthetic dependency , and pain.   ACTIVITY LIMITATIONS: carrying, standing, stairs, transfers, bed mobility, and locomotion level  PARTICIPATION LIMITATIONS: cleaning, laundry, shopping, and community activity  PERSONAL FACTORS: Age, Past/current experiences, and 3+ comorbidities: (anxiety, depression, CKD, COPD, DM, DKA, HTN lower extremity ischemia)  are also affecting patient's functional outcome.   REHAB POTENTIAL: Good  CLINICAL DECISION MAKING: Evolving/moderate complexity  EVALUATION COMPLEXITY: Moderate   GOALS: Goals reviewed with patient? Yes  SHORT TERM GOALS: Target date: 07/14/23 Patient will be independent and 100% compliant with HEP as needed for carryover of PT intervention Baseline: 06/21/23: Baseline  HEP reviewed.  Goal status: INITIAL  2.  Patient will attain L hip hyperextension to 15 deg indicative of improved ROM required for terminal stance/heel off during gait Baseline: 06/21/23: L hip extension -5 Goal status: INITIAL  3.  Patient will demonstrate modified-independent sit to stand and stand-pivot transfer from W/C to adjacent sitting surface with no LOB and no verbal cueing required from therapist for technique Baseline: 06/21/23: Difficulty and some cueing needed for safety with transferring and stand-pivot transfer Goal status: INITIAL   LONG TERM GOALS: Target date: 09/01/23  Patient will ambulate at community-level distance with front-wheeled walker ModI with no LOB and normalized hip extension and swing through for L above-knee prosthesis Baseline: 06/21/23: Noted gait deviations and pt ambulating short of household distance with L AKA prosthesis Goal status: INITIAL  2.  Pt will improve ABC Scale by 13% or greater indicative of improved confidence related to balance and fall risk and clinically meaningful change Baseline: 06/21/23: ABC Scale to be obtained on visit # 2.   06/27/23: 6.3% Goal status: INITIAL  3.  Pt will perform TUG in less than 20 seconds indicative of decreased risk of falls relative to patient-specific population Baseline: 06/21/23: TUG to be completed on visit # 2.   06/27/23: 136 sec Goal status: INITIAL  4.  Pt will improve Amputee Mobility Predictor by 4 points or greater indicative of detectable change in ADLs and functional mobility with prostheses.  Baseline: 06/21/23: AMPPRO to be completed on visits #2-3.  07/06/23: 23/47 (K2 category).  Goal status: INITIAL  5.  Patient will negotiate 3 steps ModI with FWW as needed for entering/exiting her home. Baseline: 06/21/23: Significant difficulty with stair negotiation.  Goal status: INITIAL    PLAN:  PT FREQUENCY: 2x/week  PT DURATION: 10 weeks  PLANNED INTERVENTIONS: 97164- PT Re-evaluation,  97110-Therapeutic exercises, 97530- Therapeutic activity, [id number]- Neuromuscular re-education, [phone]- Self Care, [id number]- Prosthetic training, Patient/Family education, and Balance training  PLAN FOR NEXT SESSION: Continue with weight shifting and gait training with new L AKA prosthesis, correcting gait deviations. Open-chain strengthening for BLE.    Consuela Mimes, PT, DPT #[id number]  Gertie Exon, PT 07/21/2023, 9:59 AM

## 2023-07-26 ENCOUNTER — Ambulatory Visit: Payer: 59 | Admitting: Physical Therapy

## 2023-07-26 DIAGNOSIS — Z89511 Acquired absence of right leg below knee: Secondary | ICD-10-CM

## 2023-07-26 DIAGNOSIS — M6281 Muscle weakness (generalized): Secondary | ICD-10-CM

## 2023-07-26 DIAGNOSIS — R262 Difficulty in walking, not elsewhere classified: Secondary | ICD-10-CM

## 2023-07-26 DIAGNOSIS — Z89612 Acquired absence of left leg above knee: Secondary | ICD-10-CM

## 2023-07-26 DIAGNOSIS — R2689 Other abnormalities of gait and mobility: Secondary | ICD-10-CM

## 2023-07-26 NOTE — Therapy (Unsigned)
 OUTPATIENT PHYSICAL THERAPY TREATMENT   Patient Name: Sylvia [name] MRN: [id number] DOB:1956/02/09, 68 y.o., female Today's Date: 07/26/2023  END OF SESSION:       Past Medical History:  Diagnosis Date   Anemia of chronic disease 05/16/2019   Anxiety    h/o   Arthritis    Chronic kidney disease    Cocaine abuse (HCC)    COPD (chronic obstructive pulmonary disease) (HCC)    Coronary artery disease    Depression    Diabetes mellitus without complication (HCC)    DKA (diabetic ketoacidosis) (HCC)    Elevated troponin    GERD (gastroesophageal reflux disease)    GI bleed    Hypertension    bp under control-off meds since 2019   Ischemic leg    Past Surgical History:  Procedure Laterality Date   ABDOMINAL HYSTERECTOMY     AMPUTATION Right 12/27/2018   Procedure: AMPUTATION BELOW KNEE;  Surgeon: Annice Needy, MD;  Location: [facility];  Service: General;  Laterality: Right;   AMPUTATION Left 11/05/2021   Procedure: AMPUTATION BELOW KNEE;  Surgeon: Annice Needy, MD;  Location: [facility];  Service: Vascular;  Laterality: Left;   AMPUTATION Left 11/04/2022   Procedure: AMPUTATION ABOVE KNEE;  Surgeon: Annice Needy, MD;  Location: [facility];  Service: Vascular;  Laterality: Left;   APPLICATION OF WOUND VAC  10/16/2021   Procedure: APPLICATION OF WOUND VAC;  Surgeon: Annice Needy, MD;  Location: [facility];  Service: Vascular;;   CATARACT EXTRACTION W/PHACO Right 03/27/2020   Procedure: CATARACT EXTRACTION PHACO AND INTRAOCULAR LENS PLACEMENT (IOC) RIGHT DIABETIC 7.54 00:52.5;  Surgeon: Galen Manila, MD;  Location: [facility];  Service: Ophthalmology;  Laterality: Right;   CATARACT EXTRACTION W/PHACO Left 05/20/2020   Procedure: CATARACT EXTRACTION PHACO AND INTRAOCULAR LENS PLACEMENT (IOC) LEFT DIABETIC 4.95 00:37.6;  Surgeon: Galen Manila, MD;  Location: [facility];  Service: Ophthalmology;  Laterality: Left;  Diabetic - oral meds COVID + 04-24-20    CHOLECYSTECTOMY     COLONOSCOPY WITH PROPOFOL N/A 04/16/2019   Procedure: COLONOSCOPY WITH PROPOFOL;  Surgeon: Pasty Spillers, MD;  Location: [facility];  Service: Endoscopy;  Laterality: N/A;   COLONOSCOPY WITH PROPOFOL N/A 01/16/2020   Procedure: COLONOSCOPY WITH PROPOFOL;  Surgeon: Pasty Spillers, MD;  Location: [facility];  Service: Endoscopy;  Laterality: N/A;   ENDARTERECTOMY FEMORAL Left 10/16/2021   Procedure: ENDARTERECTOMY FEMORAL;  Surgeon: Annice Needy, MD;  Location: [facility];  Service: Vascular;  Laterality: Left;   ESOPHAGOGASTRODUODENOSCOPY (EGD) WITH PROPOFOL N/A 06/15/2018   Procedure: ESOPHAGOGASTRODUODENOSCOPY (EGD) WITH PROPOFOL;  Surgeon: Toledo, Boykin Nearing, MD;  Location: [facility];  Service: Gastroenterology;  Laterality: N/A;   ESOPHAGOGASTRODUODENOSCOPY (EGD) WITH PROPOFOL N/A 01/03/2019   Procedure: ESOPHAGOGASTRODUODENOSCOPY (EGD) WITH PROPOFOL;  Surgeon: Midge Minium, MD;  Location: [facility];  Service: Endoscopy;  Laterality: N/A;   ESOPHAGOGASTRODUODENOSCOPY (EGD) WITH PROPOFOL N/A 09/25/2021   Procedure: ESOPHAGOGASTRODUODENOSCOPY (EGD) WITH PROPOFOL;  Surgeon: Midge Minium, MD;  Location: [facility];  Service: Endoscopy;  Laterality: N/A;   EYE SURGERY Bilateral    GIVENS CAPSULE STUDY  04/16/2019   Procedure: GIVENS CAPSULE STUDY;  Surgeon: Pasty Spillers, MD;  Location: [facility];  Service: Endoscopy;;   GIVENS CAPSULE STUDY N/A 06/25/2019   Procedure: GIVENS CAPSULE STUDY;  Surgeon: Wyline Mood, MD;  Location: [facility];  Service: Gastroenterology;  Laterality: N/A;   INCISION AND DRAINAGE OF WOUND Left 02/24/2022   Procedure: LEFT BKA IRRIGATION AND DEBRIDEMENT  WOUND;  Surgeon: Annice Needy, MD;  Location: [facility];  Service: Vascular;  Laterality: Left;   LOWER EXTREMITY ANGIOGRAPHY Right 08/21/2018   Procedure: LOWER EXTREMITY ANGIOGRAPHY;  Surgeon: Annice Needy, MD;  Location: [facility];  Service:  Cardiovascular;  Laterality: Right;   LOWER EXTREMITY ANGIOGRAPHY Left 08/28/2018   Procedure: LOWER EXTREMITY ANGIOGRAPHY;  Surgeon: Annice Needy, MD;  Location: [facility];  Service: Cardiovascular;  Laterality: Left;   LOWER EXTREMITY ANGIOGRAPHY Right 08/28/2018   Procedure: Lower Extremity Angiography;  Surgeon: Annice Needy, MD;  Location: [facility];  Service: Cardiovascular;  Laterality: Right;   LOWER EXTREMITY ANGIOGRAPHY Right 12/18/2018   Procedure: Lower Extremity Angiography;  Surgeon: Annice Needy, MD;  Location: [facility];  Service: Cardiovascular;  Laterality: Right;   LOWER EXTREMITY ANGIOGRAPHY Right 12/18/2018   Procedure: Lower Extremity Angiography;  Surgeon: Annice Needy, MD;  Location: [facility];  Service: Cardiovascular;  Laterality: Right;   LOWER EXTREMITY ANGIOGRAPHY Left 12/21/2018   Procedure: Lower Extremity Angiography;  Surgeon: Annice Needy, MD;  Location: [facility];  Service: Cardiovascular;  Laterality: Left;   LOWER EXTREMITY ANGIOGRAPHY Right 12/21/2018   Procedure: Lower Extremity Angiography;  Surgeon: Annice Needy, MD;  Location: [facility];  Service: Cardiovascular;  Laterality: Right;   LOWER EXTREMITY ANGIOGRAPHY Left 12/25/2020   Procedure: LOWER EXTREMITY ANGIOGRAPHY;  Surgeon: Annice Needy, MD;  Location: [facility];  Service: Cardiovascular;  Laterality: Left;   LOWER EXTREMITY ANGIOGRAPHY Left 09/21/2021   Procedure: Lower Extremity Angiography;  Surgeon: Annice Needy, MD;  Location: [facility];  Service: Cardiovascular;  Laterality: Left;   LOWER EXTREMITY ANGIOGRAPHY Left 10/13/2021   Procedure: Lower Extremity Angiography;  Surgeon: Renford Dills, MD;  Location: [facility];  Service: Cardiovascular;  Laterality: Left;   LOWER EXTREMITY ANGIOGRAPHY Left 10/14/2021   Procedure: Lower Extremity Angiography;  Surgeon: Annice Needy, MD;  Location: [facility];  Service: Cardiovascular;  Laterality: Left;   LOWER EXTREMITY ANGIOGRAPHY Left 11/02/2021   Procedure: Lower Extremity Angiography;  Surgeon: Annice Needy, MD;  Location: [facility];  Service: Cardiovascular;  Laterality: Left;   LOWER EXTREMITY INTERVENTION N/A 12/22/2018   Procedure: LOWER EXTREMITY INTERVENTION;  Surgeon: Annice Needy, MD;  Location: [facility];  Service: Cardiovascular;  Laterality: N/A;   LOWER EXTREMITY INTERVENTION Left 12/26/2020   Procedure: LOWER EXTREMITY INTERVENTION;  Surgeon: Annice Needy, MD;  Location: [facility];  Service: Cardiovascular;  Laterality: Left;   LOWER EXTREMITY INTERVENTION N/A 09/22/2021   Procedure: LOWER EXTREMITY INTERVENTION;  Surgeon: Annice Needy, MD;  Location: [facility];  Service: Cardiovascular;  Laterality: N/A;   Patient Active Problem List   Diagnosis Date Noted   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) 11/04/2022   Amputation above knee (HCC) 11/04/2022   Cellulitis of left lower extremity 02/23/2022   Sepsis due to cellulitis (HCC) 02/23/2022   Dyslipidemia 02/23/2022   Rheumatoid arthritis (HCC) 02/23/2022   Essential hypertension 02/23/2022   Type 2 diabetes mellitus with peripheral neuropathy (HCC) 02/23/2022   Multiple duodenal ulcers    Ischemic leg 12/25/2020   Rheumatoid arthritis of 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) 08/13/2020   Diabetes mellitus without complication (HCC)    GERD (gastroesophageal reflux disease)    COPD (chronic obstructive  pulmonary disease) (HCC)    GIB (gastrointestinal bleeding)    HLD (hyperlipidemia)    Depression    Elevated troponin    Syncope    Elevated troponin I level    Anemia of chronic disease 05/16/2019   Dizziness    Polyp of colon    Acute upper GI bleeding 04/13/2019   Anemia associated with acute blood loss 04/13/2019   Chronic anticoagulation 04/13/2019   Hx of right BKA  (HCC) 04/13/2019   Symptomatic anemia 04/13/2019   Upper GI bleed 04/13/2019   Acute GI bleeding 04/13/2019   Phantom pain after amputation of lower extremity (HCC) 01/30/2019   Melena    DU (duodenal ulcer)    DKA (diabetic ketoacidosis) (HCC) 12/14/2018   Atherosclerosis of artery of extremity with rest pain (HCC) 08/28/2018   PAD (peripheral artery disease) (HCC) 07/14/2018   Abdominal pain 06/13/2018   HTN (hypertension) 06/13/2018   Non-insulin dependent type 2 diabetes mellitus (HCC) 06/13/2018   Elevated lactic acid level 06/13/2018    PCP: [facility]  REFERRING PROVIDER: Georgiana Spinner, NP  REFERRING DIAG:  [phone] (ICD-10-CM) - Hx of right BKA (HCC)  [phone] (ICD-10-CM) - Hx of BKA, left (HCC)    THERAPY DIAG:  Difficulty in walking, not elsewhere classified  Muscle weakness (generalized)  Imbalance  History of amputation below knee, right (HCC)  History of above knee amputation, left (HCC)  Rationale for Evaluation and Treatment: Rehabilitation  ONSET DATE: L AKA 11/04/22   SUBJECTIVE:   SUBJECTIVE STATEMENT: Pt is a 68 year old female s/p L AKA 11/04/22. Pt has received above-knee prosthesis for L. Pt had R BKA in Sept 2020 with prosthetic rehab in this clinic. Pt is proficient with R below-knee prosthesis.  PERTINENT HISTORY: Patient reports standing with her prosthesis at home with her sister and walking with it some at home. Pt reports dealing with phantom limb pain. Patient reports getting this in R BKA and L AKA limbs. Patient reports Gabapentin helps to control phantom limb symptoms. Pt had R BKA 4-5 years ago. Pt reports some tenderness along end of L residual limb. Pt reports that she has not used her prosthesis much over previous 2 weeks. She reports using shrinker each day and completing regular checks on skin integrity for L residual limb.  Type of amputation: Above-knee amputation  Onset: L AKA 11/04/22 Follow-up appt with  MD: Pt following up with Georgiana Spinner for check-up between May and June Pain: 710 Present, 10/10 Worst: (Pain along L hip flexor/L groin) Phantom pain: Yes History of previous amputations/PAD: Yes; Hx of R BKA  Pain quality: pain quality: sharp Numbness/Tingling: Yes Imaging: No    PRECAUTIONS: None  RED FLAGS: Increased urinary urgency and functional incontinence, unknown etiology Disturbed sleep lying on L side, modified with change in position  WEIGHT BEARING RESTRICTIONS: No  FALLS:  Has patient fallen in last 6 months? No  LIVING ENVIRONMENT: Lives with:  sister, great granddaughter 28 y/o, significant other  Lives in: House/apartment; 3 steps in front to get into home; temporary living until pt finds home with flat entryway;  Shower chair in bathroom; pt able to get in/out of bathroom Stairs:  stairs into basement only, pt does not go there often Has following equipment at home: Dan Humphreys - 2 wheeled, Wheelchair (power), Wheelchair (manual), shower chair, and Grab bars; sliding board for W/C transfers  OCCUPATION: N/A  PLOF: Independent with household mobility with device  PATIENT GOALS: Get back to  walking and improved independence with L AKA prosthesis  NEXT MD VISIT: Between May-June   OBJECTIVE:  Note: Objective measures were completed at Evaluation unless otherwise noted.  MUSCULOSKELETAL: Tremor: Absent Tone: Normal, no spasticity, rigidity, or clonus Residual limb appears well-healing with no signs of erythema, skin breakdown, infection, or drainage  Posture Forward flexed posture in static standing  Palpation No pain to palpation along distal surface of residual limb.  No pain with palpation to quadriceps or hamstrings.    PATIENT SURVEYS:  ABC scale 6.3%  COGNITION: Overall cognitive status: Within functional limits for tasks assessed     SENSATION: Not tested   LOWER EXTREMITY ROM:  Active ROM Right eval Left eval  Hip flexion WNL WNL   Hip extension WNL -5  Hip abduction 40 40  Hip adduction    Hip internal rotation    Hip external rotation    Knee flexion    Knee extension    Ankle dorsiflexion    Ankle plantarflexion    Ankle inversion    Ankle eversion     (Blank rows = not tested)  LOWER EXTREMITY MMT:  MMT Right eval Left eval  Hip flexion 4+ 4  Hip extension 4 4  Hip abduction 4+ 4+  Hip adduction    Hip internal rotation    Hip external rotation    Knee flexion 4+   Knee extension 4+   Ankle dorsiflexion    Ankle plantarflexion    Ankle inversion    Ankle eversion     (Blank rows = not tested)   FUNCTIONAL TASKS:  W/C to bed transfer: CGA with sit to stand, pt presents with heavy forward lean and weight shift toward RLE, heavy upper limb push on FWW once pt has hands on walker. Heavy verbal cueing to push from seat versus walker for transferring.  Sit to stand: Difficulty initiating with slowed velocity of first 25% of transfer following lift from mat.   GAIT: Distance walked: 60 ft Assistive device utilized: Walker - 2 wheeled Level of assistance: CGA Comments: Heavy upper limb support on walker. Step-to pattern with RLE leading. Decreased terminal knee extension of L prosthesis at initial contact that can be partially corrected with verbal cueing for swing technique. Decreased LLE weight shift during stance phase. Decreased hip extension during mid-stance to terminal stance.                                                                                                                                 TREATMENT DATE: 07/26/2023    SUBJECTIVE STATEMENT:   Patient arrives eager to participate in physical therapy today. No significant c/o phantom limb pain. No new complaints this AM. Pt has not experienced recent falls or near-fall incidents.    Therapeutic Activities - patient education, repetitive task practice for improved performance of daily functional activities e.g. transferring,  weight transfer practice to improve static standing activities and weight  shift during ambulation  Gait belt and CGA utilized during all activities*  Gait around gym; 1 lap with FWW, PT CGA  -cueing for LLE swing   In // bars: Forward stepping in bars with use of step-through gait bilaterally and bilat UE support; 5x D/B length of bars with 180-degree turn on either end   Forward step-over 2-lb ankle weight (oriented horizontally) on floor for targeting and obstacle clearance; x10 with either LE  Toe tapping on 6-inch step with prostheses, alternating R/L; 2x10  Functional reach with no UE support (intermittent contact with bar if losing balance); 8 cones - stacking and unstacking, placing on adjacent raised table; x 5 minutes  Forward gait in bars with unilateral R upper limb support, step-to pattern with LLE first; x 3 D/B length of bars  -bilat UE support for 180-degree turn at end of bars   PATIENT EDUCATION: Discussed expected progression of activities in PT and goal for gait with LRAD   *next visit* Weight shifting with bilateral symmetrical stance and no UE support alternating R/L   PATIENT EDUCATION:  Education details: see above for patient education details Person educated: Patient Education method: Explanation, Demonstration, and Handouts Education comprehension: verbalized understanding and returned demonstration  HOME EXERCISE PROGRAM: Access Code: [email or web address] URL: [email or web address] Date: 06/23/2023 Prepared by: Consuela Mimes  Exercises - Prone Hip Flexor Stretch with Towel Roll (AKA)  - 2-3 x daily - 7 x weekly - 1 sets - 2-3 min hold - Supine Hip Abduction  - 2 x daily - 7 x weekly - 2 sets - 10 reps - Sidelying Hip Abduction (AKA)  - 2 x daily - 7 x weekly - 2 sets - 10 reps - Prone Hip Extension with Residual Limb (AKA)  - 2 x daily - 7 x weekly - 2 sets - 10 reps  ASSESSMENT:  CLINICAL IMPRESSION: Patient demonstrates significantly  improved proficiency with gait c FWW. Pt has sound LLE swing to achieve near-full extension prior to initial contact of LLE prosthesis. She is able to perform static standing and reaching task with less reliance on upper extremity support. Pt is still notably challenged with weight shift to LLE with unilateral R upper limb support. Patient presents with prosthetic gait deviations, hip weakness, imbalance, and difficulty with weight shifting/transfers. Pt will continue to benefit from skilled PT services to address deficits and improve function.  OBJECTIVE IMPAIRMENTS: Abnormal gait, decreased balance, decreased mobility, difficulty walking, decreased strength, prosthetic dependency , and pain.   ACTIVITY LIMITATIONS: carrying, standing, stairs, transfers, bed mobility, and locomotion level  PARTICIPATION LIMITATIONS: cleaning, laundry, shopping, and community activity  PERSONAL FACTORS: Age, Past/current experiences, and 3+ comorbidities: (anxiety, depression, CKD, COPD, DM, DKA, HTN lower extremity ischemia)  are also affecting patient's functional outcome.   REHAB POTENTIAL: Good  CLINICAL DECISION MAKING: Evolving/moderate complexity  EVALUATION COMPLEXITY: Moderate   GOALS: Goals reviewed with patient? Yes  SHORT TERM GOALS: Target date: 07/14/23 Patient will be independent and 100% compliant with HEP as needed for carryover of PT intervention Baseline: 06/21/23: Baseline HEP reviewed.  Goal status: INITIAL  2.  Patient will attain L hip hyperextension to 15 deg indicative of improved ROM required for terminal stance/heel off during gait Baseline: 06/21/23: L hip extension -5 Goal status: INITIAL  3.  Patient will demonstrate modified-independent sit to stand and stand-pivot transfer from W/C to adjacent sitting surface with no LOB and no verbal cueing required from therapist for technique Baseline: 06/21/23: Difficulty and some cueing needed for  safety with transferring and stand-pivot  transfer Goal status: INITIAL   LONG TERM GOALS: Target date: 09/01/23  Patient will ambulate at community-level distance with front-wheeled walker ModI with no LOB and normalized hip extension and swing through for L above-knee prosthesis Baseline: 06/21/23: Noted gait deviations and pt ambulating short of household distance with L AKA prosthesis Goal status: INITIAL  2.  Pt will improve ABC Scale by 13% or greater indicative of improved confidence related to balance and fall risk and clinically meaningful change Baseline: 06/21/23: ABC Scale to be obtained on visit # 2.   06/27/23: 6.3% Goal status: INITIAL  3.  Pt will perform TUG in less than 20 seconds indicative of decreased risk of falls relative to patient-specific population Baseline: 06/21/23: TUG to be completed on visit # 2.   06/27/23: 136 sec Goal status: INITIAL  4.  Pt will improve Amputee Mobility Predictor by 4 points or greater indicative of detectable change in ADLs and functional mobility with prostheses.  Baseline: 06/21/23: AMPPRO to be completed on visits #2-3.  07/06/23: 23/47 (K2 category).  Goal status: INITIAL  5.  Patient will negotiate 3 steps ModI with FWW as needed for entering/exiting her home. Baseline: 06/21/23: Significant difficulty with stair negotiation.  Goal status: INITIAL    PLAN:  PT FREQUENCY: 2x/week  PT DURATION: 10 weeks  PLANNED INTERVENTIONS: 97164- PT Re-evaluation, 97110-Therapeutic exercises, 97530- Therapeutic activity, [id number]- Neuromuscular re-education, [phone]- Self Care, 18299- Prosthetic training, Patient/Family education, and Balance training  PLAN FOR NEXT SESSION: Continue with weight shifting and gait training with new L AKA prosthesis, correcting gait deviations. Open-chain strengthening for BLE.    Consuela Mimes, PT, DPT #[id number]  Gertie Exon, PT 07/26/2023, 8:04 AM

## 2023-07-27 ENCOUNTER — Encounter: Payer: Self-pay | Admitting: Physical Therapy

## 2023-07-28 ENCOUNTER — Ambulatory Visit: Payer: 59 | Admitting: Physical Therapy

## 2023-08-02 ENCOUNTER — Ambulatory Visit: Payer: 59 | Admitting: Physical Therapy

## 2023-08-02 ENCOUNTER — Encounter: Payer: Self-pay | Admitting: Physical Therapy

## 2023-08-02 DIAGNOSIS — M6281 Muscle weakness (generalized): Secondary | ICD-10-CM

## 2023-08-02 DIAGNOSIS — R262 Difficulty in walking, not elsewhere classified: Secondary | ICD-10-CM

## 2023-08-02 DIAGNOSIS — R2689 Other abnormalities of gait and mobility: Secondary | ICD-10-CM

## 2023-08-02 DIAGNOSIS — Z89511 Acquired absence of right leg below knee: Secondary | ICD-10-CM

## 2023-08-02 DIAGNOSIS — Z89612 Acquired absence of left leg above knee: Secondary | ICD-10-CM

## 2023-08-02 NOTE — Therapy (Unsigned)
 OUTPATIENT PHYSICAL THERAPY TREATMENT   Patient Name: Sylvia [name] MRN: [id number] DOB:[birth year], 68 y.o., female Today's Date: 08/02/2023  END OF SESSION:  PT End of Session - 08/02/23 0919     Visit Number 9    Number of Visits 21    Date for PT Re-Evaluation 09/01/23    Authorization Type [employer]aid    PT Start Time 0901    PT Stop Time 0945    PT Time Calculation (min) 44 min    Equipment Utilized During Treatment Gait belt   patient's personal FWW   Activity Tolerance Patient limited by fatigue;Patient tolerated treatment well   c/o bilat phantom limb pain   Behavior During Therapy WFL for tasks assessed/performed              Past Medical History:  Diagnosis Date   Anemia of chronic disease 05/16/2019   Anxiety    h/o   Arthritis    Chronic kidney disease    Cocaine abuse (HCC)    COPD (chronic obstructive pulmonary disease) (HCC)    Coronary artery disease    Depression    Diabetes mellitus without complication (HCC)    DKA (diabetic ketoacidosis) (HCC)    Elevated troponin    GERD (gastroesophageal reflux disease)    GI bleed    Hypertension    bp under control-off meds since 2019   Ischemic leg    Past Surgical History:  Procedure Laterality Date   ABDOMINAL HYSTERECTOMY     AMPUTATION Right 12/27/2018   Procedure: AMPUTATION BELOW KNEE;  Surgeon: Celso College, MD;  Location: [facility];  Service: General;  Laterality: Right;   AMPUTATION Left 11/05/2021   Procedure: AMPUTATION BELOW KNEE;  Surgeon: Celso College, MD;  Location: [facility];  Service: Vascular;  Laterality: Left;   AMPUTATION Left 11/04/2022   Procedure: AMPUTATION ABOVE KNEE;  Surgeon: Celso College, MD;  Location: [facility];  Service: Vascular;  Laterality: Left;   APPLICATION OF WOUND VAC  10/16/2021   Procedure: APPLICATION OF WOUND VAC;  Surgeon: Celso College, MD;  Location: [facility];  Service: Vascular;;   CATARACT EXTRACTION W/PHACO Right 03/27/2020   Procedure: CATARACT  EXTRACTION PHACO AND INTRAOCULAR LENS PLACEMENT (IOC) RIGHT DIABETIC 7.54 00:52.5;  Surgeon: Clair Crews, MD;  Location: [facility];  Service: Ophthalmology;  Laterality: Right;   CATARACT EXTRACTION W/PHACO Left 05/20/2020   Procedure: CATARACT EXTRACTION PHACO AND INTRAOCULAR LENS PLACEMENT (IOC) LEFT DIABETIC 4.95 00:37.6;  Surgeon: Clair Crews, MD;  Location: [facility];  Service: Ophthalmology;  Laterality: Left;  Diabetic - oral meds COVID + 04-24-20   CHOLECYSTECTOMY     COLONOSCOPY WITH PROPOFOL N/A 04/16/2019   Procedure: COLONOSCOPY WITH PROPOFOL;  Surgeon: Irby Mannan, MD;  Location: [facility];  Service: Endoscopy;  Laterality: N/A;   COLONOSCOPY WITH PROPOFOL N/A 01/16/2020   Procedure: COLONOSCOPY WITH PROPOFOL;  Surgeon: Irby Mannan, MD;  Location: [facility];  Service: Endoscopy;  Laterality: N/A;   ENDARTERECTOMY FEMORAL Left 10/16/2021   Procedure: ENDARTERECTOMY FEMORAL;  Surgeon: Celso College, MD;  Location: [facility];  Service: Vascular;  Laterality: Left;   ESOPHAGOGASTRODUODENOSCOPY (EGD) WITH PROPOFOL N/A 06/15/2018   Procedure: ESOPHAGOGASTRODUODENOSCOPY (EGD) WITH PROPOFOL;  Surgeon: Toledo, Alphonsus Jeans, MD;  Location: [facility];  Service: Gastroenterology;  Laterality: N/A;   ESOPHAGOGASTRODUODENOSCOPY (EGD) WITH PROPOFOL N/A 01/03/2019   Procedure: ESOPHAGOGASTRODUODENOSCOPY (EGD) WITH PROPOFOL;  Surgeon: Marnee Sink, MD;  Location: [facility];  Service: Endoscopy;  Laterality: N/A;  ESOPHAGOGASTRODUODENOSCOPY (EGD) WITH PROPOFOL N/A 09/25/2021   Procedure: ESOPHAGOGASTRODUODENOSCOPY (EGD) WITH PROPOFOL;  Surgeon: Marnee Sink, MD;  Location: [facility];  Service: Endoscopy;  Laterality: N/A;   EYE SURGERY Bilateral    GIVENS CAPSULE STUDY  04/16/2019   Procedure: GIVENS CAPSULE STUDY;  Surgeon: Irby Mannan, MD;  Location: [facility];  Service: Endoscopy;;   GIVENS CAPSULE STUDY N/A  06/25/2019   Procedure: GIVENS CAPSULE STUDY;  Surgeon: Luke Salaam, MD;  Location: [facility];  Service: Gastroenterology;  Laterality: N/A;   INCISION AND DRAINAGE OF WOUND Left 02/24/2022   Procedure: LEFT BKA IRRIGATION AND DEBRIDEMENT WOUND;  Surgeon: Celso College, MD;  Location: [facility];  Service: Vascular;  Laterality: Left;   LOWER EXTREMITY ANGIOGRAPHY Right 08/21/2018   Procedure: LOWER EXTREMITY ANGIOGRAPHY;  Surgeon: Celso College, MD;  Location: [facility];  Service: Cardiovascular;  Laterality: Right;   LOWER EXTREMITY ANGIOGRAPHY Left 08/28/2018   Procedure: LOWER EXTREMITY ANGIOGRAPHY;  Surgeon: Celso College, MD;  Location: [facility];  Service: Cardiovascular;  Laterality: Left;   LOWER EXTREMITY ANGIOGRAPHY Right 08/28/2018   Procedure: Lower Extremity Angiography;  Surgeon: Celso College, MD;  Location: [facility];  Service: Cardiovascular;  Laterality: Right;   LOWER EXTREMITY ANGIOGRAPHY Right 12/18/2018   Procedure: Lower Extremity Angiography;  Surgeon: Celso College, MD;  Location: [facility];  Service: Cardiovascular;  Laterality: Right;   LOWER EXTREMITY ANGIOGRAPHY Right 12/18/2018   Procedure: Lower Extremity Angiography;  Surgeon: Celso College, MD;  Location: [facility];  Service: Cardiovascular;  Laterality: Right;   LOWER EXTREMITY ANGIOGRAPHY Left 12/21/2018   Procedure: Lower Extremity Angiography;  Surgeon: Celso College, MD;  Location: [facility];  Service: Cardiovascular;  Laterality: Left;   LOWER EXTREMITY ANGIOGRAPHY Right 12/21/2018   Procedure: Lower Extremity Angiography;  Surgeon: Celso College, MD;  Location: [facility];  Service: Cardiovascular;  Laterality: Right;   LOWER EXTREMITY ANGIOGRAPHY Left 12/25/2020   Procedure: LOWER EXTREMITY ANGIOGRAPHY;  Surgeon: Celso College, MD;  Location: [facility];  Service: Cardiovascular;  Laterality: Left;   LOWER EXTREMITY  ANGIOGRAPHY Left 09/21/2021   Procedure: Lower Extremity Angiography;  Surgeon: Celso College, MD;  Location: [facility];  Service: Cardiovascular;  Laterality: Left;   LOWER EXTREMITY ANGIOGRAPHY Left 10/13/2021   Procedure: Lower Extremity Angiography;  Surgeon: Jackquelyn Mass, MD;  Location: [facility];  Service: Cardiovascular;  Laterality: Left;   LOWER EXTREMITY ANGIOGRAPHY Left 10/14/2021   Procedure: Lower Extremity Angiography;  Surgeon: Celso College, MD;  Location: [facility];  Service: Cardiovascular;  Laterality: Left;   LOWER EXTREMITY ANGIOGRAPHY Left 11/02/2021   Procedure: Lower Extremity Angiography;  Surgeon: Celso College, MD;  Location: [facility];  Service: Cardiovascular;  Laterality: Left;   LOWER EXTREMITY INTERVENTION N/A 12/22/2018   Procedure: LOWER EXTREMITY INTERVENTION;  Surgeon: Celso College, MD;  Location: [facility];  Service: Cardiovascular;  Laterality: N/A;   LOWER EXTREMITY INTERVENTION Left 12/26/2020   Procedure: LOWER EXTREMITY INTERVENTION;  Surgeon: Celso College, MD;  Location: [facility];  Service: Cardiovascular;  Laterality: Left;   LOWER EXTREMITY INTERVENTION N/A 09/22/2021   Procedure: LOWER EXTREMITY INTERVENTION;  Surgeon: Celso College, MD;  Location: [facility];  Service: Cardiovascular;  Laterality: N/A;   Patient Active Problem List   Diagnosis Date Noted  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) 11/04/2022   Amputation above knee (HCC) 11/04/2022   Cellulitis of left lower extremity 02/23/2022   Sepsis due to cellulitis (HCC) 02/23/2022   Dyslipidemia 02/23/2022   Rheumatoid arthritis (HCC) 02/23/2022   Essential hypertension 02/23/2022   Type 2 diabetes mellitus with peripheral neuropathy (HCC) 02/23/2022   Multiple duodenal ulcers    Ischemic leg 12/25/2020   Rheumatoid arthritis of 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)  08/13/2020   Diabetes mellitus without complication (HCC)    GERD (gastroesophageal reflux disease)    COPD (chronic obstructive pulmonary disease) (HCC)    GIB (gastrointestinal bleeding)    HLD (hyperlipidemia)    Depression    Elevated troponin    Syncope    Elevated troponin I level    Anemia of chronic disease 05/16/2019   Dizziness    Polyp of colon    Acute upper GI bleeding 04/13/2019   Anemia associated with acute blood loss 04/13/2019   Chronic anticoagulation 04/13/2019   Hx of right BKA (HCC) 04/13/2019   Symptomatic anemia 04/13/2019   Upper GI bleed 04/13/2019   Acute GI bleeding 04/13/2019   Phantom pain after amputation of lower extremity (HCC) 01/30/2019   Melena    DU (duodenal ulcer)    DKA (diabetic ketoacidosis) (HCC) 12/14/2018   Atherosclerosis of artery of extremity with rest pain (HCC) 08/28/2018   PAD (peripheral artery disease) (HCC) 07/14/2018   Abdominal pain 06/13/2018   HTN (hypertension) 06/13/2018   Non-insulin dependent type 2 diabetes mellitus (HCC) 06/13/2018   Elevated lactic acid level 06/13/2018    PCP: [facility]  REFERRING PROVIDER: Georgiana Spinner, NP  REFERRING DIAG:  [phone] (ICD-10-CM) - Hx of right BKA (HCC)  Z89.512 (ICD-10-CM) - Hx of BKA, left (HCC)    THERAPY DIAG:  Difficulty in walking, not elsewhere classified  Muscle weakness (generalized)  Imbalance  History of amputation below knee, right (HCC)  History of above knee amputation, left (HCC)  Rationale for Evaluation and Treatment: Rehabilitation  ONSET DATE: L AKA 11/04/22   SUBJECTIVE:   SUBJECTIVE STATEMENT: Pt is a 68 year old female s/p L AKA 11/04/22. Pt has received above-knee prosthesis for L. Pt had R BKA in Sept 2020 with prosthetic rehab in this clinic. Pt is proficient with R below-knee prosthesis.  PERTINENT HISTORY: Patient reports standing with her prosthesis at home with her sister and walking with it some at  home. Pt reports dealing with phantom limb pain. Patient reports getting this in R BKA and L AKA limbs. Patient reports Gabapentin helps to control phantom limb symptoms. Pt had R BKA 4-5 years ago. Pt reports some tenderness along end of L residual limb. Pt reports that she has not used her prosthesis much over previous 2 weeks. She reports using shrinker each day and completing regular checks on skin integrity for L residual limb.  Type of amputation: Above-knee amputation  Onset: L AKA 11/04/22 Follow-up appt with MD: Pt following up with Georgiana Spinner for check-up between May and June Pain: 710 Present, 10/10 Worst: (Pain along L hip flexor/L groin) Phantom pain: Yes History of previous amputations/PAD: Yes; Hx of R BKA  Pain quality: pain quality: sharp Numbness/Tingling: Yes Imaging: No    PRECAUTIONS: None  RED FLAGS: Increased urinary urgency and functional incontinence, unknown etiology Disturbed sleep lying on L side, modified with change in position  WEIGHT BEARING RESTRICTIONS: No  FALLS:  Has patient fallen  in last 6 months? No  LIVING ENVIRONMENT: Lives with:  sister, great granddaughter 3 y/o, significant other  Lives in: House/apartment; 3 steps in front to get into home; temporary living until pt finds home with flat entryway;  Shower chair in bathroom; pt able to get in/out of bathroom Stairs:  stairs into basement only, pt does not go there often Has following equipment at home: Otho Blitz - 2 wheeled, Wheelchair (power), Wheelchair (manual), shower chair, and Grab bars; sliding board for W/C transfers  OCCUPATION: N/A  PLOF: Independent with household mobility with device  PATIENT GOALS: Get back to walking and improved independence with L AKA prosthesis  NEXT MD VISIT: Between May-June   OBJECTIVE:  Note: Objective measures were completed at Evaluation unless otherwise noted.  MUSCULOSKELETAL: Tremor: Absent Tone: Normal, no spasticity, rigidity, or  clonus Residual limb appears well-healing with no signs of erythema, skin breakdown, infection, or drainage  Posture Forward flexed posture in static standing  Palpation No pain to palpation along distal surface of residual limb.  No pain with palpation to quadriceps or hamstrings.    PATIENT SURVEYS:  ABC scale 6.3%  COGNITION: Overall cognitive status: Within functional limits for tasks assessed     SENSATION: Not tested   LOWER EXTREMITY ROM:  Active ROM Right eval Left eval  Hip flexion WNL WNL  Hip extension WNL -5  Hip abduction 40 40  Hip adduction    Hip internal rotation    Hip external rotation    Knee flexion    Knee extension    Ankle dorsiflexion    Ankle plantarflexion    Ankle inversion    Ankle eversion     (Blank rows = not tested)  LOWER EXTREMITY MMT:  MMT Right eval Left eval  Hip flexion 4+ 4  Hip extension 4 4  Hip abduction 4+ 4+  Hip adduction    Hip internal rotation    Hip external rotation    Knee flexion 4+   Knee extension 4+   Ankle dorsiflexion    Ankle plantarflexion    Ankle inversion    Ankle eversion     (Blank rows = not tested)   FUNCTIONAL TASKS:  W/C to bed transfer: CGA with sit to stand, pt presents with heavy forward lean and weight shift toward RLE, heavy upper limb push on FWW once pt has hands on walker. Heavy verbal cueing to push from seat versus walker for transferring.  Sit to stand: Difficulty initiating with slowed velocity of first 25% of transfer following lift from mat.   GAIT: Distance walked: 60 ft Assistive device utilized: Walker - 2 wheeled Level of assistance: CGA Comments: Heavy upper limb support on walker. Step-to pattern with RLE leading. Decreased terminal knee extension of L prosthesis at initial contact that can be partially corrected with verbal cueing for swing technique. Decreased LLE weight shift during stance phase. Decreased hip extension during mid-stance to terminal stance.  TREATMENT DATE: 07/26/2023    SUBJECTIVE STATEMENT:   Patient reports concern with her L above-knee prosthesis fitting loosely. She reports having various-ply liners at home for L residual limb. Pt reports compliance with HEP. No recent falls.     Therapeutic Activities - patient education, repetitive task practice for improved performance of daily functional activities e.g. transferring, weight transfer practice to improve static standing activities and weight shift during ambulation  Gait belt and CGA utilized during all activities*   Gait around gym; 2 lap with FWW, PT CGA  -cueing for LLE swing   In // bars: Forward stepping in bars with use of step-through gait bilaterally and bilat UE support; 5x D/B length of bars with 180-degree turn on either end   Forward step-over 3 consecutive 4x1 wooden boards on floor for targeting and obstacle clearance; 3x D/B // bars  Toe tapping on 6-inch step with prostheses, alternating R/L; 2x10  Functional reach with no UE support (intermittent contact with bar if losing balance); 8 cones - stacking and unstacking, placing on adjacent raised table; x 5 minutes  Sit to stand with chair + Airex; task practice to reduce upper limb assistance and controlled descent; x 5 reps   PATIENT EDUCATION: Discussed use of thicker-ply sock in prosthesis to ensure snug fit and improved stability of prosthesis and comfort of wear.    *next visit* Forward gait in bars with unilateral R upper limb support, step-to pattern with LLE first; x 3 D/B length of bars  -bilat UE support for 180-degree turn at end of bars  *not today* Weight shifting with bilateral symmetrical stance and no UE support alternating R/L; x 60 sec  PATIENT EDUCATION:  Education details: see above for patient education details Person educated:  Patient Education method: Explanation, Demonstration, and Handouts Education comprehension: verbalized understanding and returned demonstration  HOME EXERCISE PROGRAM: Access Code: [email or web address] URL: [email or web address] Date: 06/23/2023 Prepared by: Consuela Mimes  Exercises - Prone Hip Flexor Stretch with Towel Roll (AKA)  - 2-3 x daily - 7 x weekly - 1 sets - 2-3 min hold - Supine Hip Abduction  - 2 x daily - 7 x weekly - 2 sets - 10 reps - Sidelying Hip Abduction (AKA)  - 2 x daily - 7 x weekly - 2 sets - 10 reps - Prone Hip Extension with Residual Limb (AKA)  - 2 x daily - 7 x weekly - 2 sets - 10 reps  ASSESSMENT:  CLINICAL IMPRESSION: Patient has notable gap between residual limb and socket wall of above-knee prosthesis. We discussed use of thicker sock to ensure proper fit of prosthesis and improved stability of prosthesis. In spite of mildly loose fit, pt is able to perform significant volume of stepping/walking activity and weight shifting drills while maintaining postural control. Patient presents with prosthetic gait deviations, hip weakness, imbalance, and difficulty with weight shifting/transfers. Pt will continue to benefit from skilled PT services to address deficits and improve function.  OBJECTIVE IMPAIRMENTS: Abnormal gait, decreased balance, decreased mobility, difficulty walking, decreased strength, prosthetic dependency , and pain.   ACTIVITY LIMITATIONS: carrying, standing, stairs, transfers, bed mobility, and locomotion level  PARTICIPATION LIMITATIONS: cleaning, laundry, shopping, and community activity  PERSONAL FACTORS: Age, Past/current experiences, and 3+ comorbidities: (anxiety, depression, CKD, COPD, DM, DKA, HTN lower extremity ischemia)  are also affecting patient's functional outcome.   REHAB POTENTIAL: Good  CLINICAL DECISION MAKING: Evolving/moderate complexity  EVALUATION COMPLEXITY: Moderate   GOALS: Goals reviewed with patient?  Yes  SHORT  TERM GOALS: Target date: 07/14/23 Patient will be independent and 100% compliant with HEP as needed for carryover of PT intervention Baseline: 06/21/23: Baseline HEP reviewed.  Goal status: INITIAL  2.  Patient will attain L hip hyperextension to 15 deg indicative of improved ROM required for terminal stance/heel off during gait Baseline: 06/21/23: L hip extension -5 Goal status: INITIAL  3.  Patient will demonstrate modified-independent sit to stand and stand-pivot transfer from W/C to adjacent sitting surface with no LOB and no verbal cueing required from therapist for technique Baseline: 06/21/23: Difficulty and some cueing needed for safety with transferring and stand-pivot transfer Goal status: INITIAL   LONG TERM GOALS: Target date: 09/01/23  Patient will ambulate at community-level distance with front-wheeled walker ModI with no LOB and normalized hip extension and swing through for L above-knee prosthesis Baseline: 06/21/23: Noted gait deviations and pt ambulating short of household distance with L AKA prosthesis Goal status: INITIAL  2.  Pt will improve ABC Scale by 13% or greater indicative of improved confidence related to balance and fall risk and clinically meaningful change Baseline: 06/21/23: ABC Scale to be obtained on visit # 2.   06/27/23: 6.3% Goal status: INITIAL  3.  Pt will perform TUG in less than 20 seconds indicative of decreased risk of falls relative to patient-specific population Baseline: 06/21/23: TUG to be completed on visit # 2.   06/27/23: 136 sec Goal status: INITIAL  4.  Pt will improve Amputee Mobility Predictor by 4 points or greater indicative of detectable change in ADLs and functional mobility with prostheses.  Baseline: 06/21/23: AMPPRO to be completed on visits #2-3.  07/06/23: 23/47 (K2 category).  Goal status: INITIAL  5.  Patient will negotiate 3 steps ModI with FWW as needed for entering/exiting her home. Baseline: 06/21/23: Significant  difficulty with stair negotiation.  Goal status: INITIAL    PLAN:  PT FREQUENCY: 2x/week  PT DURATION: 10 weeks  PLANNED INTERVENTIONS: 97164- PT Re-evaluation, 97110-Therapeutic exercises, 97530- Therapeutic activity, [id number]- Neuromuscular re-education, [phone]- Self Care, 29562- Prosthetic training, Patient/Family education, and Balance training  PLAN FOR NEXT SESSION: Continue with weight shifting and gait training with new L AKA prosthesis, correcting gait deviations. Open-chain strengthening for BLE.    Denese Finn, PT, DPT #[id number]  Aleatha Hunting, PT 08/02/2023, 9:19 AM

## 2023-08-04 ENCOUNTER — Ambulatory Visit: Payer: 59 | Admitting: Physical Therapy

## 2023-08-04 DIAGNOSIS — Z89511 Acquired absence of right leg below knee: Secondary | ICD-10-CM

## 2023-08-04 DIAGNOSIS — R2689 Other abnormalities of gait and mobility: Secondary | ICD-10-CM

## 2023-08-04 DIAGNOSIS — R262 Difficulty in walking, not elsewhere classified: Secondary | ICD-10-CM

## 2023-08-04 DIAGNOSIS — Z89612 Acquired absence of left leg above knee: Secondary | ICD-10-CM

## 2023-08-04 DIAGNOSIS — M6281 Muscle weakness (generalized): Secondary | ICD-10-CM

## 2023-08-04 NOTE — Therapy (Signed)
 OUTPATIENT PHYSICAL THERAPY TREATMENT AND PROGRESS NOTE   Dates of reporting period  06/21/23   to   08/04/23   Patient Name: Sylvia [name] MRN: [id number] DOB:May 07, 1955, 68 y.o., female Today's Date: 08/04/2023  END OF SESSION:  PT End of Session - 08/04/23 0913     Visit Number 10    Number of Visits 21    Date for PT Re-Evaluation 09/01/23    Authorization Type [employer]    PT Start Time 0910    PT Stop Time 0945    PT Time Calculation (min) 35 min    Equipment Utilized During Treatment Gait belt   patient's personal FWW   Activity Tolerance Patient limited by fatigue;Patient tolerated treatment well   c/o bilat phantom limb pain   Behavior During Therapy WFL for tasks assessed/performed             Past Medical History:  Diagnosis Date   Anemia of chronic disease 05/16/2019   Anxiety    h/o   Arthritis    Chronic kidney disease    Cocaine abuse (HCC)    COPD (chronic obstructive pulmonary disease) (HCC)    Coronary artery disease    Depression    Diabetes mellitus without complication (HCC)    DKA (diabetic ketoacidosis) (HCC)    Elevated troponin    GERD (gastroesophageal reflux disease)    GI bleed    Hypertension    bp under control-off meds since 2019   Ischemic leg    Past Surgical History:  Procedure Laterality Date   ABDOMINAL HYSTERECTOMY     AMPUTATION Right 12/27/2018   Procedure: AMPUTATION BELOW KNEE;  Surgeon: Celso College, MD;  Location: [facility];  Service: General;  Laterality: Right;   AMPUTATION Left 11/05/2021   Procedure: AMPUTATION BELOW KNEE;  Surgeon: Celso College, MD;  Location: [facility];  Service: Vascular;  Laterality: Left;   AMPUTATION Left 11/04/2022   Procedure: AMPUTATION ABOVE KNEE;  Surgeon: Celso College, MD;  Location: [facility];  Service: Vascular;  Laterality: Left;   APPLICATION OF WOUND VAC  10/16/2021   Procedure: APPLICATION OF WOUND VAC;  Surgeon: Celso College, MD;  Location: [facility];  Service: Vascular;;    CATARACT EXTRACTION W/PHACO Right 03/27/2020   Procedure: CATARACT EXTRACTION PHACO AND INTRAOCULAR LENS PLACEMENT (IOC) RIGHT DIABETIC 7.54 00:52.5;  Surgeon: Clair Crews, MD;  Location: [facility];  Service: Ophthalmology;  Laterality: Right;   CATARACT EXTRACTION W/PHACO Left 05/20/2020   Procedure: CATARACT EXTRACTION PHACO AND INTRAOCULAR LENS PLACEMENT (IOC) LEFT DIABETIC 4.95 00:37.6;  Surgeon: Clair Crews, MD;  Location: [facility];  Service: Ophthalmology;  Laterality: Left;  Diabetic - oral meds COVID + 04-24-20   CHOLECYSTECTOMY     COLONOSCOPY WITH PROPOFOL  N/A 04/16/2019   Procedure: COLONOSCOPY WITH PROPOFOL ;  Surgeon: Irby Mannan, MD;  Location: [facility];  Service: Endoscopy;  Laterality: N/A;   COLONOSCOPY WITH PROPOFOL  N/A 01/16/2020   Procedure: COLONOSCOPY WITH PROPOFOL ;  Surgeon: Irby Mannan, MD;  Location: [facility];  Service: Endoscopy;  Laterality: N/A;   ENDARTERECTOMY FEMORAL Left 10/16/2021   Procedure: ENDARTERECTOMY FEMORAL;  Surgeon: Celso College, MD;  Location: [facility];  Service: Vascular;  Laterality: Left;   ESOPHAGOGASTRODUODENOSCOPY (EGD) WITH PROPOFOL  N/A 06/15/2018   Procedure: ESOPHAGOGASTRODUODENOSCOPY (EGD) WITH PROPOFOL ;  Surgeon: Toledo, Alphonsus Jeans, MD;  Location: [facility];  Service: Gastroenterology;  Laterality: N/A;   ESOPHAGOGASTRODUODENOSCOPY (EGD) WITH PROPOFOL  N/A 01/03/2019   Procedure: ESOPHAGOGASTRODUODENOSCOPY (EGD) WITH  PROPOFOL ;  Surgeon: Marnee Sink, MD;  Location: [facility];  Service: Endoscopy;  Laterality: N/A;   ESOPHAGOGASTRODUODENOSCOPY (EGD) WITH PROPOFOL  N/A 09/25/2021   Procedure: ESOPHAGOGASTRODUODENOSCOPY (EGD) WITH PROPOFOL ;  Surgeon: Marnee Sink, MD;  Location: [facility];  Service: Endoscopy;  Laterality: N/A;   EYE SURGERY Bilateral    GIVENS CAPSULE STUDY  04/16/2019   Procedure: GIVENS CAPSULE STUDY;  Surgeon: Irby Mannan, MD;  Location: [facility];  Service: Endoscopy;;   GIVENS CAPSULE STUDY N/A 06/25/2019   Procedure: GIVENS CAPSULE STUDY;  Surgeon: Luke Salaam, MD;  Location: [facility];  Service: Gastroenterology;  Laterality: N/A;   INCISION AND DRAINAGE OF WOUND Left 02/24/2022   Procedure: LEFT BKA IRRIGATION AND DEBRIDEMENT WOUND;  Surgeon: Celso College, MD;  Location: [facility];  Service: Vascular;  Laterality: Left;   LOWER EXTREMITY ANGIOGRAPHY Right 08/21/2018   Procedure: LOWER EXTREMITY ANGIOGRAPHY;  Surgeon: Celso College, MD;  Location: [facility];  Service: Cardiovascular;  Laterality: Right;   LOWER EXTREMITY ANGIOGRAPHY Left 08/28/2018   Procedure: LOWER EXTREMITY ANGIOGRAPHY;  Surgeon: Celso College, MD;  Location: [facility];  Service: Cardiovascular;  Laterality: Left;   LOWER EXTREMITY ANGIOGRAPHY Right 08/28/2018   Procedure: Lower Extremity Angiography;  Surgeon: Celso College, MD;  Location: [facility];  Service: Cardiovascular;  Laterality: Right;   LOWER EXTREMITY ANGIOGRAPHY Right 12/18/2018   Procedure: Lower Extremity Angiography;  Surgeon: Celso College, MD;  Location: [facility];  Service: Cardiovascular;  Laterality: Right;   LOWER EXTREMITY ANGIOGRAPHY Right 12/18/2018   Procedure: Lower Extremity Angiography;  Surgeon: Celso College, MD;  Location: [facility];  Service: Cardiovascular;  Laterality: Right;   LOWER EXTREMITY ANGIOGRAPHY Left 12/21/2018   Procedure: Lower Extremity Angiography;  Surgeon: Celso College, MD;  Location: [facility];  Service: Cardiovascular;  Laterality: Left;   LOWER EXTREMITY ANGIOGRAPHY Right 12/21/2018   Procedure: Lower Extremity Angiography;  Surgeon: Celso College, MD;  Location: [facility];  Service: Cardiovascular;  Laterality: Right;   LOWER EXTREMITY ANGIOGRAPHY Left 12/25/2020   Procedure: LOWER EXTREMITY ANGIOGRAPHY;  Surgeon: Celso College, MD;  Location: [facility];  Service:  Cardiovascular;  Laterality: Left;   LOWER EXTREMITY ANGIOGRAPHY Left 09/21/2021   Procedure: Lower Extremity Angiography;  Surgeon: Celso College, MD;  Location: [facility];  Service: Cardiovascular;  Laterality: Left;   LOWER EXTREMITY ANGIOGRAPHY Left 10/13/2021   Procedure: Lower Extremity Angiography;  Surgeon: Jackquelyn Mass, MD;  Location: [facility];  Service: Cardiovascular;  Laterality: Left;   LOWER EXTREMITY ANGIOGRAPHY Left 10/14/2021   Procedure: Lower Extremity Angiography;  Surgeon: Celso College, MD;  Location: [facility];  Service: Cardiovascular;  Laterality: Left;   LOWER EXTREMITY ANGIOGRAPHY Left 11/02/2021   Procedure: Lower Extremity Angiography;  Surgeon: Celso College, MD;  Location: [facility];  Service: Cardiovascular;  Laterality: Left;   LOWER EXTREMITY INTERVENTION N/A 12/22/2018   Procedure: LOWER EXTREMITY INTERVENTION;  Surgeon: Celso College, MD;  Location: [facility];  Service: Cardiovascular;  Laterality: N/A;   LOWER EXTREMITY INTERVENTION Left 12/26/2020   Procedure: LOWER EXTREMITY INTERVENTION;  Surgeon: Celso College, MD;  Location: [facility];  Service: Cardiovascular;  Laterality: Left;   LOWER EXTREMITY INTERVENTION N/A 09/22/2021   Procedure: LOWER EXTREMITY INTERVENTION;  Surgeon: Celso College, MD;  Location: [facility];  Service: Cardiovascular;  Laterality: N/A;   Patient Active Problem List   Diagnosis Date Noted   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) 11/04/2022   Amputation above knee (HCC) 11/04/2022   Cellulitis of left lower extremity 02/23/2022   Sepsis due to cellulitis (HCC) 02/23/2022   Dyslipidemia 02/23/2022   Rheumatoid arthritis (HCC) 02/23/2022   Essential hypertension 02/23/2022   Type 2 diabetes mellitus with peripheral neuropathy (HCC) 02/23/2022   Multiple duodenal ulcers    Ischemic leg 12/25/2020   Rheumatoid arthritis of  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) 08/13/2020   Diabetes mellitus without complication (HCC)    GERD (gastroesophageal reflux disease)    COPD (chronic obstructive pulmonary disease) (HCC)    GIB (gastrointestinal bleeding)    HLD (hyperlipidemia)    Depression    Elevated troponin    Syncope    Elevated troponin I level    Anemia of chronic disease 05/16/2019   Dizziness    Polyp of colon    Acute upper GI bleeding 04/13/2019   Anemia associated with acute blood loss 04/13/2019   Chronic anticoagulation 04/13/2019   Hx of right BKA (HCC) 04/13/2019   Symptomatic anemia 04/13/2019   Upper GI bleed 04/13/2019   Acute GI bleeding 04/13/2019   Phantom pain after amputation of lower extremity (HCC) 01/30/2019   Melena    DU (duodenal ulcer)    DKA (diabetic ketoacidosis) (HCC) 12/14/2018   Atherosclerosis of artery of extremity with rest pain (HCC) 08/28/2018   PAD (peripheral artery disease) (HCC) 07/14/2018   Abdominal pain 06/13/2018   HTN (hypertension) 06/13/2018   Non-insulin  dependent type 2 diabetes mellitus (HCC) 06/13/2018   Elevated lactic acid level 06/13/2018    PCP: [facility]  REFERRING PROVIDER: Valene Gash, NP  REFERRING DIAG:  [phone] (ICD-10-CM) - Hx of right BKA (HCC)  [phone] (ICD-10-CM) - Hx of BKA, left (HCC)    THERAPY DIAG:  Difficulty in walking, not elsewhere classified  Muscle weakness (generalized)  Imbalance  History of amputation below knee, right (HCC)  History of above knee amputation, left (HCC)  Rationale for Evaluation and Treatment: Rehabilitation  ONSET DATE: L AKA 11/04/22   SUBJECTIVE:   SUBJECTIVE STATEMENT: Pt is a 68 year old female s/p L AKA 11/04/22. Pt has received above-knee prosthesis for L. Pt had R BKA in Sept 2020 with prosthetic rehab in this clinic. Pt is proficient with R below-knee prosthesis.  PERTINENT HISTORY: Patient reports standing with her prosthesis at  home with her sister and walking with it some at home. Pt reports dealing with phantom limb pain. Patient reports getting this in R BKA and L AKA limbs. Patient reports Gabapentin  helps to control phantom limb symptoms. Pt had R BKA 4-5 years ago. Pt reports some tenderness along end of L residual limb. Pt reports that she has not used her prosthesis much over previous 2 weeks. She reports using shrinker each day and completing regular checks on skin integrity for L residual limb.  Type of amputation: Above-knee amputation  Onset: L AKA 11/04/22 Follow-up appt with MD: Pt following up with Fall[facility] Brown for check-up between May and June Pain: 710 Present, 10/10 Worst: (Pain along L hip flexor/L groin) Phantom pain: Yes History of previous amputations/PAD: Yes; Hx of R BKA  Pain quality: pain quality: sharp Numbness/Tingling: Yes Imaging: No    PRECAUTIONS: None  RED FLAGS: Increased urinary urgency and functional incontinence, unknown etiology Disturbed sleep  lying on L side, modified with change in position  WEIGHT BEARING RESTRICTIONS: No  FALLS:  Has patient fallen in last 6 months? No  LIVING ENVIRONMENT: Lives with:  sister, great granddaughter 16 y/o, significant other  Lives in: House/apartment; 3 steps in front to get into home; temporary living until pt finds home with flat entryway;  Shower chair in bathroom; pt able to get in/out of bathroom Stairs:  stairs into basement only, pt does not go there often Has following equipment at home: Otho Blitz - 2 wheeled, Wheelchair (power), Wheelchair (manual), shower chair, and Grab bars; sliding board for W/C transfers  OCCUPATION: N/A  PLOF: Independent with household mobility with device  PATIENT GOALS: Get back to walking and improved independence with L AKA prosthesis  NEXT MD VISIT: Between May-June   OBJECTIVE:  Note: Objective measures were completed at Evaluation unless otherwise noted.  MUSCULOSKELETAL: Tremor:  Absent Tone: Normal, no spasticity, rigidity, or clonus Residual limb appears well-healing with no signs of erythema, skin breakdown, infection, or drainage  Posture Forward flexed posture in static standing  Palpation No pain to palpation along distal surface of residual limb.  No pain with palpation to quadriceps or hamstrings.    PATIENT SURVEYS:  ABC scale 6.3%  COGNITION: Overall cognitive status: Within functional limits for tasks assessed     SENSATION: Not tested   LOWER EXTREMITY ROM:  Active ROM Right eval Left eval  Hip flexion WNL WNL  Hip extension WNL -5  Hip abduction 40 40  Hip adduction    Hip internal rotation    Hip external rotation    Knee flexion    Knee extension    Ankle dorsiflexion    Ankle plantarflexion    Ankle inversion    Ankle eversion     (Blank rows = not tested)  LOWER EXTREMITY MMT:  MMT Right eval Left eval  Hip flexion 4+ 4  Hip extension 4 4  Hip abduction 4+ 4+  Hip adduction    Hip internal rotation    Hip external rotation    Knee flexion 4+   Knee extension 4+   Ankle dorsiflexion    Ankle plantarflexion    Ankle inversion    Ankle eversion     (Blank rows = not tested)   FUNCTIONAL TASKS:  W/C to bed transfer: CGA with sit to stand, pt presents with heavy forward lean and weight shift toward RLE, heavy upper limb push on FWW once pt has hands on walker. Heavy verbal cueing to push from seat versus walker for transferring.  Sit to stand: Difficulty initiating with slowed velocity of first 25% of transfer following lift from mat.   GAIT: Distance walked: 60 ft Assistive device utilized: Walker - 2 wheeled Level of assistance: CGA Comments: Heavy upper limb support on walker. Step-to pattern with RLE leading. Decreased terminal knee extension of L prosthesis at initial contact that can be partially corrected with verbal cueing for swing technique. Decreased LLE weight shift during stance phase. Decreased hip  extension during mid-stance to terminal stance.  TREATMENT DATE: 08/04/23    SUBJECTIVE STATEMENT:   Patient reports she is doing a lot better with L BKA prosthesis. Pt had fall last night with negotiating doorway when her W/C flipped backward. No falls when donning her prostheses and walking. Pt denies HA, dizziness, N&V, visual changes. Patient reports 25% global rating of improvement to date. Patient reports doing well with thicker-ply sock in her L BKA prosthesis. Pt reports using her prosthesis about 1 hour per day.     Therapeutic Activities - patient education, repetitive task practice for improved performance of daily functional activities e.g. transferring, weight transfer practice to improve static standing activities and weight shift during ambulation   *GOAL UPDATE PERFORMED (see updated Goals below)  -completed performance-based outcome measures  -ABC Scale update   PATIENT EDUCATION: Discussed current progress and POC. Recommended increasing wear time with prosthesis gradually each week as tolerated to acclimate residual limb to prosthesis and practice more with transferring/gait at home.     *next visit* Functional reach with no UE support (intermittent contact with bar if losing balance); 8 cones - stacking and unstacking, placing on adjacent raised table; x 5 minutes Forward gait in bars with unilateral R upper limb support, step-to pattern with LLE first; x 3 D/B length of bars  -bilat UE support for 180-degree turn at end of bars   *not today* Sit to stand with chair + Airex; task practice to reduce upper limb assistance and controlled descent; x 5 reps Weight shifting with bilateral symmetrical stance and no UE support alternating R/L; x 60 sec   PATIENT EDUCATION:  Education details: see above for patient education details Person  educated: Patient Education method: Explanation, Demonstration, and Handouts Education comprehension: verbalized understanding and returned demonstration  HOME EXERCISE PROGRAM: Access Code: [email or web address] URL: [email or web address] Date: 06/23/2023 Prepared by: Denese Finn  Exercises - Prone Hip Flexor Stretch with Towel Roll (AKA)  - 2-3 x daily - 7 x weekly - 1 sets - 2-3 min hold - Supine Hip Abduction  - 2 x daily - 7 x weekly - 2 sets - 10 reps - Sidelying Hip Abduction (AKA)  - 2 x daily - 7 x weekly - 2 sets - 10 reps - Prone Hip Extension with Residual Limb (AKA)  - 2 x daily - 7 x weekly - 2 sets - 10 reps  ASSESSMENT:  CLINICAL IMPRESSION: Time for completing re-assessment/outcome measures is limited due to late arrival time secondary to transportation challenges. Pt has no significant change in Amputee Mobility Predictor, and she also has minimal increase in ABC scale. Hip extension mobility has improved, but pt still needs further work on normalizing hip extension for late stance phase. Pt has not sustained fall when ambulating with prostheses - 2 backward falls in W/C have occurred while family pushed chair through doorway threshold and picked up chair on back wheels. TUG will need to be completed next visit; all other updates were completed today for progress note. Pt exhibits heavy weight shift to RLE and heavy upper limb support with transfers. She still requires CGA and careful monitoring for any loss of stability of L>R prosthesis during stair descent. Patient presents with prosthetic gait deviations, hip weakness, imbalance, and difficulty with weight shifting/transfers. Pt will continue to benefit from skilled PT services to address deficits and improve function.  OBJECTIVE IMPAIRMENTS: Abnormal gait, decreased balance, decreased mobility, difficulty walking, decreased strength, prosthetic dependency , and pain.   ACTIVITY LIMITATIONS: carrying, standing,  stairs, transfers, bed mobility,  and locomotion level  PARTICIPATION LIMITATIONS: cleaning, laundry, shopping, and community activity  PERSONAL FACTORS: Age, Past/current experiences, and 3+ comorbidities: (anxiety, depression, CKD, COPD, DM, DKA, HTN lower extremity ischemia)  are also affecting patient's functional outcome.   REHAB POTENTIAL: Good  CLINICAL DECISION MAKING: Evolving/moderate complexity  EVALUATION COMPLEXITY: Moderate   GOALS: Goals reviewed with patient? Yes  SHORT TERM GOALS: Target date: 07/14/23 Patient will be independent and 100% compliant with HEP as needed for carryover of PT intervention Baseline: 06/21/23: Baseline HEP reviewed.  08/04/23: largely compliant with HEP.  Goal status: IN PROGRESS/MOSTLY MET  2.  Patient will attain L hip hyperextension to 15 deg indicative of improved ROM required for terminal stance/heel off during gait Baseline: 06/21/23: L hip extension -5.   08/04/23: 5 deg hyperextension Goal status: IN PROGRESS  3.  Patient will demonstrate modified-independent sit to stand and stand-pivot transfer from W/C to adjacent sitting surface with no LOB and no verbal cueing required from therapist for technique Baseline: 06/21/23: Difficulty and some cueing needed for safety with transferring and stand-pivot transfer.   08/04/23: completed with supervision level of assist  Goal status: IN PROGRESS/MOSTLY MET   LONG TERM GOALS: Target date: 09/01/23  Patient will ambulate at community-level distance with front-wheeled walker ModI with no LOB and normalized hip extension and swing through for L above-knee prosthesis Baseline: 06/21/23: Noted gait deviations and pt ambulating short of household distance with L AKA prosthesis.   08/04/23: Ambulates for home distance with CGA. Limited capacity for community-level distance walking with FWW.  Goal status: IN PROGRESS  2.  Pt will improve ABC Scale by 13% or greater indicative of improved confidence related to  balance and fall risk and clinically meaningful change Baseline: 06/21/23: ABC Scale to be obtained on visit # 2.   06/27/23: 6.3%  08/04/23: 8.8% Goal status: NOT MET  3.  Pt will perform TUG in less than 20 seconds indicative of decreased risk of falls relative to patient-specific population Baseline: 06/21/23: TUG to be completed on visit # 2.   06/27/23: 136 sec.  08/04/23: Deferred to next visit.  Goal status: INITIAL  4.  Pt will improve Amputee Mobility Predictor by 4 points or greater indicative of detectable change in ADLs and functional mobility with prostheses.  Baseline: 06/21/23: AMPPRO to be completed on visits #2-3.  07/06/23: 23/47 08/04/23:  22/47. Goal status: NOT MET   5.  Patient will negotiate 3 steps ModI with FWW as needed for entering/exiting her home. Baseline: 06/21/23: Significant difficulty with stair negotiation.  08/04/23: Completed today with ModA x 2 with heavy upper limb assist.  Goal status: IN PROGRESS    PLAN:  PT FREQUENCY: 2x/week  PT DURATION: 10 weeks  PLANNED INTERVENTIONS: 97164- PT Re-evaluation, 97110-Therapeutic exercises, 97530- Therapeutic activity, [id number]- Neuromuscular re-education, 97535- Self Care, 16109- Prosthetic training, Patient/Family education, and Balance training  PLAN FOR NEXT SESSION: Continue with weight shifting and gait training with new L AKA prosthesis, correcting gait deviations. Progress with lessening upper limb support as able to negotiating various obstacles/terrain with bilat prostheses. Open-chain strengthening for BLE.    Denese Finn, PT, DPT #[id number]  Aleatha Hunting, PT 08/05/2023, 12:02 PM

## 2023-08-08 ENCOUNTER — Encounter: Payer: Self-pay | Admitting: Physical Therapy

## 2023-08-08 NOTE — Therapy (Signed)
 OUTPATIENT PHYSICAL THERAPY TREATMENT AND PROGRESS NOTE   Dates of reporting period  06/21/23   to   08/04/23   Patient Name: Sylvia [name] MRN: [id number] DOB:11/02/1955, 68 y.o., female Today's Date: 08/04/2023  END OF SESSION:    Past Medical History:  Diagnosis Date   Anemia of chronic disease 05/16/2019   Anxiety    h/o   Arthritis    Chronic kidney disease    Cocaine abuse (HCC)    COPD (chronic obstructive pulmonary disease) (HCC)    Coronary artery disease    Depression    Diabetes mellitus without complication (HCC)    DKA (diabetic ketoacidosis) (HCC)    Elevated troponin    GERD (gastroesophageal reflux disease)    GI bleed    Hypertension    bp under control-off meds since 2019   Ischemic leg    Past Surgical History:  Procedure Laterality Date   ABDOMINAL HYSTERECTOMY     AMPUTATION Right 12/27/2018   Procedure: AMPUTATION BELOW KNEE;  Surgeon: Celso College, MD;  Location: [facility];  Service: General;  Laterality: Right;   AMPUTATION Left 11/05/2021   Procedure: AMPUTATION BELOW KNEE;  Surgeon: Celso College, MD;  Location: [facility];  Service: Vascular;  Laterality: Left;   AMPUTATION Left 11/04/2022   Procedure: AMPUTATION ABOVE KNEE;  Surgeon: Celso College, MD;  Location: [facility];  Service: Vascular;  Laterality: Left;   APPLICATION OF WOUND VAC  10/16/2021   Procedure: APPLICATION OF WOUND VAC;  Surgeon: Celso College, MD;  Location: [facility];  Service: Vascular;;   CATARACT EXTRACTION W/PHACO Right 03/27/2020   Procedure: CATARACT EXTRACTION PHACO AND INTRAOCULAR LENS PLACEMENT (IOC) RIGHT DIABETIC 7.54 00:52.5;  Surgeon: Clair Crews, MD;  Location: [facility];  Service: Ophthalmology;  Laterality: Right;   CATARACT EXTRACTION W/PHACO Left 05/20/2020   Procedure: CATARACT EXTRACTION PHACO AND INTRAOCULAR LENS PLACEMENT (IOC) LEFT DIABETIC 4.95 00:37.6;  Surgeon: Clair Crews, MD;  Location: [facility];  Service:  Ophthalmology;  Laterality: Left;  Diabetic - oral meds COVID + 04-24-20   CHOLECYSTECTOMY     COLONOSCOPY WITH PROPOFOL  N/A 04/16/2019   Procedure: COLONOSCOPY WITH PROPOFOL ;  Surgeon: Irby Mannan, MD;  Location: [facility];  Service: Endoscopy;  Laterality: N/A;   COLONOSCOPY WITH PROPOFOL  N/A 01/16/2020   Procedure: COLONOSCOPY WITH PROPOFOL ;  Surgeon: Irby Mannan, MD;  Location: [facility];  Service: Endoscopy;  Laterality: N/A;   ENDARTERECTOMY FEMORAL Left 10/16/2021   Procedure: ENDARTERECTOMY FEMORAL;  Surgeon: Celso College, MD;  Location: [facility];  Service: Vascular;  Laterality: Left;   ESOPHAGOGASTRODUODENOSCOPY (EGD) WITH PROPOFOL  N/A 06/15/2018   Procedure: ESOPHAGOGASTRODUODENOSCOPY (EGD) WITH PROPOFOL ;  Surgeon: [place], Alphonsus Jeans, MD;  Location: [facility];  Service: Gastroenterology;  Laterality: N/A;   ESOPHAGOGASTRODUODENOSCOPY (EGD) WITH PROPOFOL  N/A 01/03/2019   Procedure: ESOPHAGOGASTRODUODENOSCOPY (EGD) WITH PROPOFOL ;  Surgeon: Marnee Sink, MD;  Location: [facility];  Service: Endoscopy;  Laterality: N/A;   ESOPHAGOGASTRODUODENOSCOPY (EGD) WITH PROPOFOL  N/A 09/25/2021   Procedure: ESOPHAGOGASTRODUODENOSCOPY (EGD) WITH PROPOFOL ;  Surgeon: Marnee Sink, MD;  Location: [facility];  Service: Endoscopy;  Laterality: N/A;   EYE SURGERY Bilateral    GIVENS CAPSULE STUDY  04/16/2019   Procedure: GIVENS CAPSULE STUDY;  Surgeon: Irby Mannan, MD;  Location: [facility];  Service: Endoscopy;;   GIVENS CAPSULE STUDY N/A 06/25/2019   Procedure: GIVENS CAPSULE STUDY;  Surgeon: Luke Salaam, MD;  Location: [facility];  Service: Gastroenterology;  Laterality: N/A;   INCISION  AND DRAINAGE OF WOUND Left 02/24/2022   Procedure: LEFT BKA IRRIGATION AND DEBRIDEMENT WOUND;  Surgeon: Celso College, MD;  Location: [facility];  Service: Vascular;  Laterality: Left;   LOWER EXTREMITY ANGIOGRAPHY Right 08/21/2018   Procedure: LOWER EXTREMITY  ANGIOGRAPHY;  Surgeon: Celso College, MD;  Location: [facility];  Service: Cardiovascular;  Laterality: Right;   LOWER EXTREMITY ANGIOGRAPHY Left 08/28/2018   Procedure: LOWER EXTREMITY ANGIOGRAPHY;  Surgeon: Celso College, MD;  Location: [facility];  Service: Cardiovascular;  Laterality: Left;   LOWER EXTREMITY ANGIOGRAPHY Right 08/28/2018   Procedure: Lower Extremity Angiography;  Surgeon: Celso College, MD;  Location: [facility];  Service: Cardiovascular;  Laterality: Right;   LOWER EXTREMITY ANGIOGRAPHY Right 12/18/2018   Procedure: Lower Extremity Angiography;  Surgeon: Celso College, MD;  Location: [facility];  Service: Cardiovascular;  Laterality: Right;   LOWER EXTREMITY ANGIOGRAPHY Right 12/18/2018   Procedure: Lower Extremity Angiography;  Surgeon: Celso College, MD;  Location: [facility];  Service: Cardiovascular;  Laterality: Right;   LOWER EXTREMITY ANGIOGRAPHY Left 12/21/2018   Procedure: Lower Extremity Angiography;  Surgeon: Celso College, MD;  Location: [facility];  Service: Cardiovascular;  Laterality: Left;   LOWER EXTREMITY ANGIOGRAPHY Right 12/21/2018   Procedure: Lower Extremity Angiography;  Surgeon: Celso College, MD;  Location: [facility];  Service: Cardiovascular;  Laterality: Right;   LOWER EXTREMITY ANGIOGRAPHY Left 12/25/2020   Procedure: LOWER EXTREMITY ANGIOGRAPHY;  Surgeon: Celso College, MD;  Location: [facility];  Service: Cardiovascular;  Laterality: Left;   LOWER EXTREMITY ANGIOGRAPHY Left 09/21/2021   Procedure: Lower Extremity Angiography;  Surgeon: Celso College, MD;  Location: [facility];  Service: Cardiovascular;  Laterality: Left;   LOWER EXTREMITY ANGIOGRAPHY Left 10/13/2021   Procedure: Lower Extremity Angiography;  Surgeon: Jackquelyn Mass, MD;  Location: [facility];  Service: Cardiovascular;  Laterality: Left;   LOWER EXTREMITY ANGIOGRAPHY Left 10/14/2021    Procedure: Lower Extremity Angiography;  Surgeon: Celso College, MD;  Location: [facility];  Service: Cardiovascular;  Laterality: Left;   LOWER EXTREMITY ANGIOGRAPHY Left 11/02/2021   Procedure: Lower Extremity Angiography;  Surgeon: Celso College, MD;  Location: [facility];  Service: Cardiovascular;  Laterality: Left;   LOWER EXTREMITY INTERVENTION N/A 12/22/2018   Procedure: LOWER EXTREMITY INTERVENTION;  Surgeon: Celso College, MD;  Location: [facility];  Service: Cardiovascular;  Laterality: N/A;   LOWER EXTREMITY INTERVENTION Left 12/26/2020   Procedure: LOWER EXTREMITY INTERVENTION;  Surgeon: Celso College, MD;  Location: [facility];  Service: Cardiovascular;  Laterality: Left;   LOWER EXTREMITY INTERVENTION N/A 09/22/2021   Procedure: LOWER EXTREMITY INTERVENTION;  Surgeon: Celso College, MD;  Location: [facility];  Service: Cardiovascular;  Laterality: N/A;   Patient Active Problem List   Diagnosis Date Noted   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) 11/04/2022   Amputation above knee (HCC) 11/04/2022   Cellulitis of left lower extremity 02/23/2022   Sepsis due to cellulitis (HCC) 02/23/2022   Dyslipidemia 02/23/2022   Rheumatoid arthritis (HCC) 02/23/2022   Essential hypertension 02/23/2022   Type 2 diabetes mellitus with peripheral neuropathy (HCC) 02/23/2022   Multiple duodenal ulcers    Ischemic leg 12/25/2020   Rheumatoid arthritis of 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) 08/13/2020   Diabetes mellitus without complication (  HCC)    GERD (gastroesophageal reflux disease)    COPD (chronic obstructive pulmonary disease) (HCC)    GIB (gastrointestinal bleeding)    HLD (hyperlipidemia)    Depression    Elevated troponin    Syncope    Elevated troponin I level    Anemia of chronic disease 05/16/2019   Dizziness    Polyp of colon    Acute upper GI bleeding 04/13/2019   Anemia associated with  acute blood loss 04/13/2019   Chronic anticoagulation 04/13/2019   Hx of right BKA (HCC) 04/13/2019   Symptomatic anemia 04/13/2019   Upper GI bleed 04/13/2019   Acute GI bleeding 04/13/2019   Phantom pain after amputation of lower extremity (HCC) 01/30/2019   Melena    DU (duodenal ulcer)    DKA (diabetic ketoacidosis) (HCC) 12/14/2018   Atherosclerosis of artery of extremity with rest pain (HCC) 08/28/2018   PAD (peripheral artery disease) (HCC) 07/14/2018   Abdominal pain 06/13/2018   HTN (hypertension) 06/13/2018   Non-insulin  dependent type 2 diabetes mellitus (HCC) 06/13/2018   Elevated lactic acid level 06/13/2018    PCP: [facility]  REFERRING PROVIDER: Valene Gash, NP  REFERRING DIAG:  [phone] (ICD-10-CM) - Hx of right BKA (HCC)  Z89.512 (ICD-10-CM) - Hx of BKA, left (HCC)    THERAPY DIAG:  Difficulty in walking, not elsewhere classified  Muscle weakness (generalized)  Imbalance  History of amputation below knee, right (HCC)  History of above knee amputation, left (HCC)  Rationale for Evaluation and Treatment: Rehabilitation  ONSET DATE: L AKA 11/04/22   SUBJECTIVE:   SUBJECTIVE STATEMENT: Pt is a 68 year old female s/p L AKA 11/04/22. Pt has received above-knee prosthesis for L. Pt had R BKA in Sept 2020 with prosthetic rehab in this clinic. Pt is proficient with R below-knee prosthesis.  PERTINENT HISTORY: Patient reports standing with her prosthesis at home with her sister and walking with it some at home. Pt reports dealing with phantom limb pain. Patient reports getting this in R BKA and L AKA limbs. Patient reports Gabapentin  helps to control phantom limb symptoms. Pt had R BKA 4-5 years ago. Pt reports some tenderness along end of L residual limb. Pt reports that she has not used her prosthesis much over previous 2 weeks. She reports using shrinker each day and completing regular checks on skin integrity for L residual limb.   Type of amputation: Above-knee amputation  Onset: L AKA 11/04/22 Follow-up appt with MD: Pt following up with Fall[facility] Brown for check-up between May and June Pain: 710 Present, 10/10 Worst: (Pain along L hip flexor/L groin) Phantom pain: Yes History of previous amputations/PAD: Yes; Hx of R BKA  Pain quality: pain quality: sharp Numbness/Tingling: Yes Imaging: No    PRECAUTIONS: None  RED FLAGS: Increased urinary urgency and functional incontinence, unknown etiology Disturbed sleep lying on L side, modified with change in position  WEIGHT BEARING RESTRICTIONS: No  FALLS:  Has patient fallen in last 6 months? No  LIVING ENVIRONMENT: Lives with:  sister, great granddaughter 54 y/o, significant other  Lives in: House/apartment; 3 steps in front to get into home; temporary living until pt finds home with flat entryway;  Shower chair in bathroom; pt able to get in/out of bathroom Stairs:  stairs into basement only, pt does not go there often Has following equipment at home: Otho Blitz - 2 wheeled, Wheelchair (power), Wheelchair (manual), shower chair, and Grab bars; sliding board for W/C transfers  OCCUPATION: N/A  PLOF: Independent with household mobility with device  PATIENT GOALS: Get back to walking and improved independence with L AKA prosthesis  NEXT MD VISIT: Between May-June   OBJECTIVE:  Note: Objective measures were completed at Evaluation unless otherwise noted.  MUSCULOSKELETAL: Tremor: Absent Tone: Normal, no spasticity, rigidity, or clonus Residual limb appears well-healing with no signs of erythema, skin breakdown, infection, or drainage  Posture Forward flexed posture in static standing  Palpation No pain to palpation along distal surface of residual limb.  No pain with palpation to quadriceps or hamstrings.    PATIENT SURVEYS:  ABC scale 6.3%  COGNITION: Overall cognitive status: Within functional limits for tasks assessed     SENSATION: Not  tested   LOWER EXTREMITY ROM:  Active ROM Right eval Left eval  Hip flexion WNL WNL  Hip extension WNL -5  Hip abduction 40 40  Hip adduction    Hip internal rotation    Hip external rotation    Knee flexion    Knee extension    Ankle dorsiflexion    Ankle plantarflexion    Ankle inversion    Ankle eversion     (Blank rows = not tested)  LOWER EXTREMITY MMT:  MMT Right eval Left eval  Hip flexion 4+ 4  Hip extension 4 4  Hip abduction 4+ 4+  Hip adduction    Hip internal rotation    Hip external rotation    Knee flexion 4+   Knee extension 4+   Ankle dorsiflexion    Ankle plantarflexion    Ankle inversion    Ankle eversion     (Blank rows = not tested)   FUNCTIONAL TASKS:  W/C to bed transfer: CGA with sit to stand, pt presents with heavy forward lean and weight shift toward RLE, heavy upper limb push on FWW once pt has hands on walker. Heavy verbal cueing to push from seat versus walker for transferring.  Sit to stand: Difficulty initiating with slowed velocity of first 25% of transfer following lift from mat.   GAIT: Distance walked: 60 ft Assistive device utilized: Walker - 2 wheeled Level of assistance: CGA Comments: Heavy upper limb support on walker. Step-to pattern with RLE leading. Decreased terminal knee extension of L prosthesis at initial contact that can be partially corrected with verbal cueing for swing technique. Decreased LLE weight shift during stance phase. Decreased hip extension during mid-stance to terminal stance.                                                                                                                                 TREATMENT DATE: 08/04/23    SUBJECTIVE STATEMENT:   Patient reports she is doing a lot better with L BKA prosthesis. Pt had fall last night with negotiating doorway when her W/C flipped backward. No falls when donning her prostheses and walking. Pt denies HA, dizziness, N&V, visual changes. Patient  reports 25% global rating of  improvement to date. Patient reports doing well with thicker-ply sock in her L BKA prosthesis. Pt reports using her prosthesis about 1 hour per day.     Therapeutic Activities - patient education, repetitive task practice for improved performance of daily functional activities e.g. transferring, weight transfer practice to improve static standing activities and weight shift during ambulation   *GOAL UPDATE PERFORMED (see updated Goals below)  -completed performance-based outcome measures  -ABC Scale update   PATIENT EDUCATION: Discussed current progress and POC. Recommended increasing wear time with prosthesis gradually each week as tolerated to acclimate residual limb to prosthesis and practice more with transferring/gait at home.     *next visit* Functional reach with no UE support (intermittent contact with bar if losing balance); 8 cones - stacking and unstacking, placing on adjacent raised table; x 5 minutes Forward gait in bars with unilateral R upper limb support, step-to pattern with LLE first; x 3 D/B length of bars  -bilat UE support for 180-degree turn at end of bars   *not today* Sit to stand with chair + Airex; task practice to reduce upper limb assistance and controlled descent; x 5 reps Weight shifting with bilateral symmetrical stance and no UE support alternating R/L; x 60 sec   PATIENT EDUCATION:  Education details: see above for patient education details Person educated: Patient Education method: Explanation, Demonstration, and Handouts Education comprehension: verbalized understanding and returned demonstration  HOME EXERCISE PROGRAM: Access Code: [email or web address] URL: [email or web address] Date: 06/23/2023 Prepared by: Denese Finn  Exercises - Prone Hip Flexor Stretch with Towel Roll (AKA)  - 2-3 x daily - 7 x weekly - 1 sets - 2-3 min hold - Supine Hip Abduction  - 2 x daily - 7 x weekly - 2 sets - 10 reps -  Sidelying Hip Abduction (AKA)  - 2 x daily - 7 x weekly - 2 sets - 10 reps - Prone Hip Extension with Residual Limb (AKA)  - 2 x daily - 7 x weekly - 2 sets - 10 reps  ASSESSMENT:  CLINICAL IMPRESSION: Time for completing re-assessment/outcome measures is limited due to late arrival time secondary to transportation challenges. Pt has no significant change in Amputee Mobility Predictor, and she also has minimal increase in ABC scale. Hip extension mobility has improved, but pt still needs further work on normalizing hip extension for late stance phase. Pt has not sustained fall when ambulating with prostheses - 2 backward falls in W/C have occurred while family pushed chair through doorway threshold and picked up chair on back wheels. TUG will need to be completed next visit; all other updates were completed today for progress note. Pt exhibits heavy weight shift to RLE and heavy upper limb support with transfers. She still requires CGA and careful monitoring for any loss of stability of L>R prosthesis during stair descent. Patient presents with prosthetic gait deviations, hip weakness, imbalance, and difficulty with weight shifting/transfers. Pt will continue to benefit from skilled PT services to address deficits and improve function.  OBJECTIVE IMPAIRMENTS: Abnormal gait, decreased balance, decreased mobility, difficulty walking, decreased strength, prosthetic dependency , and pain.   ACTIVITY LIMITATIONS: carrying, standing, stairs, transfers, bed mobility, and locomotion level  PARTICIPATION LIMITATIONS: cleaning, laundry, shopping, and community activity  PERSONAL FACTORS: Age, Past/current experiences, and 3+ comorbidities: (anxiety, depression, CKD, COPD, DM, DKA, HTN lower extremity ischemia)  are also affecting patient's functional outcome.   REHAB POTENTIAL: Good  CLINICAL DECISION MAKING: Evolving/moderate complexity  EVALUATION COMPLEXITY: Moderate   GOALS: Goals  reviewed with  patient? Yes  SHORT TERM GOALS: Target date: 07/14/23 Patient will be independent and 100% compliant with HEP as needed for carryover of PT intervention Baseline: 06/21/23: Baseline HEP reviewed.  08/04/23: largely compliant with HEP.  Goal status: IN PROGRESS/MOSTLY MET  2.  Patient will attain L hip hyperextension to 15 deg indicative of improved ROM required for terminal stance/heel off during gait Baseline: 06/21/23: L hip extension -5.   08/04/23: 5 deg hyperextension Goal status: IN PROGRESS  3.  Patient will demonstrate modified-independent sit to stand and stand-pivot transfer from W/C to adjacent sitting surface with no LOB and no verbal cueing required from therapist for technique Baseline: 06/21/23: Difficulty and some cueing needed for safety with transferring and stand-pivot transfer.   08/04/23: completed with supervision level of assist  Goal status: IN PROGRESS/MOSTLY MET   LONG TERM GOALS: Target date: 09/01/23  Patient will ambulate at community-level distance with front-wheeled walker ModI with no LOB and normalized hip extension and swing through for L above-knee prosthesis Baseline: 06/21/23: Noted gait deviations and pt ambulating short of household distance with L AKA prosthesis.   08/04/23: Ambulates for home distance with CGA. Limited capacity for community-level distance walking with FWW.  Goal status: IN PROGRESS  2.  Pt will improve ABC Scale by 13% or greater indicative of improved confidence related to balance and fall risk and clinically meaningful change Baseline: 06/21/23: ABC Scale to be obtained on visit # 2.   06/27/23: 6.3%  08/04/23: 8.8% Goal status: NOT MET  3.  Pt will perform TUG in less than 20 seconds indicative of decreased risk of falls relative to patient-specific population Baseline: 06/21/23: TUG to be completed on visit # 2.   06/27/23: 136 sec.  08/04/23: Deferred to next visit.  Goal status: INITIAL  4.  Pt will improve Amputee Mobility Predictor by 4  points or greater indicative of detectable change in ADLs and functional mobility with prostheses.  Baseline: 06/21/23: AMPPRO to be completed on visits #2-3.  07/06/23: 23/47 08/04/23:  22/47. Goal status: NOT MET   5.  Patient will negotiate 3 steps ModI with FWW as needed for entering/exiting her home. Baseline: 06/21/23: Significant difficulty with stair negotiation.  08/04/23: Completed today with ModA x 2 with heavy upper limb assist.  Goal status: IN PROGRESS    PLAN:  PT FREQUENCY: 2x/week  PT DURATION: 10 weeks  PLANNED INTERVENTIONS: 97164- PT Re-evaluation, 97110-Therapeutic exercises, 97530- Therapeutic activity, [id number]- Neuromuscular re-education, 97535- Self Care, [id number]- Prosthetic training, Patient/Family education, and Balance training  PLAN FOR NEXT SESSION: Continue with weight shifting and gait training with new L AKA prosthesis, correcting gait deviations. Progress with lessening upper limb support as able to negotiating various obstacles/terrain with bilat prostheses. Open-chain strengthening for BLE.    Denese Finn, PT, DPT #[id number]  Aleatha Hunting, PT 08/08/2023, 5:07 PM

## 2023-08-09 ENCOUNTER — Ambulatory Visit: Payer: 59 | Admitting: Physical Therapy

## 2023-08-09 DIAGNOSIS — Z89511 Acquired absence of right leg below knee: Secondary | ICD-10-CM

## 2023-08-09 DIAGNOSIS — R262 Difficulty in walking, not elsewhere classified: Secondary | ICD-10-CM | POA: Diagnosis not present

## 2023-08-09 DIAGNOSIS — M6281 Muscle weakness (generalized): Secondary | ICD-10-CM

## 2023-08-09 DIAGNOSIS — R2689 Other abnormalities of gait and mobility: Secondary | ICD-10-CM

## 2023-08-09 DIAGNOSIS — Z89612 Acquired absence of left leg above knee: Secondary | ICD-10-CM

## 2023-08-11 ENCOUNTER — Ambulatory Visit: Payer: 59 | Admitting: Physical Therapy

## 2023-08-11 DIAGNOSIS — Z89511 Acquired absence of right leg below knee: Secondary | ICD-10-CM

## 2023-08-11 DIAGNOSIS — R262 Difficulty in walking, not elsewhere classified: Secondary | ICD-10-CM

## 2023-08-11 DIAGNOSIS — Z89612 Acquired absence of left leg above knee: Secondary | ICD-10-CM

## 2023-08-11 DIAGNOSIS — R2689 Other abnormalities of gait and mobility: Secondary | ICD-10-CM

## 2023-08-11 DIAGNOSIS — M6281 Muscle weakness (generalized): Secondary | ICD-10-CM

## 2023-08-11 NOTE — Therapy (Signed)
 OUTPATIENT PHYSICAL THERAPY TREATMENT    Patient Name: Sylvia [name] MRN: [id number] DOB:1955/09/30, 68 y.o., female Today's Date: 08/11/2023   END OF SESSION:  PT End of Session - 08/11/23 0952     Visit Number 12    Number of Visits 21    Date for PT Re-Evaluation 09/01/23    Authorization Type [employer]    PT Start Time 0906    PT Stop Time 0945    PT Time Calculation (min) 39 min    Equipment Utilized During Treatment Gait belt   patient's personal FWW   Activity Tolerance Patient limited by fatigue;Patient tolerated treatment well   c/o bilat phantom limb pain   Behavior During Therapy WFL for tasks assessed/performed               Past Medical History:  Diagnosis Date   Anemia of chronic disease 05/16/2019   Anxiety    h/o   Arthritis    Chronic kidney disease    Cocaine abuse (HCC)    COPD (chronic obstructive pulmonary disease) (HCC)    Coronary artery disease    Depression    Diabetes mellitus without complication (HCC)    DKA (diabetic ketoacidosis) (HCC)    Elevated troponin    GERD (gastroesophageal reflux disease)    GI bleed    Hypertension    bp under control-off meds since 2019   Ischemic leg    Past Surgical History:  Procedure Laterality Date   ABDOMINAL HYSTERECTOMY     AMPUTATION Right 12/27/2018   Procedure: AMPUTATION BELOW KNEE;  Surgeon: Celso College, MD;  Location: [facility];  Service: General;  Laterality: Right;   AMPUTATION Left 11/05/2021   Procedure: AMPUTATION BELOW KNEE;  Surgeon: Celso College, MD;  Location: [facility];  Service: Vascular;  Laterality: Left;   AMPUTATION Left 11/04/2022   Procedure: AMPUTATION ABOVE KNEE;  Surgeon: Celso College, MD;  Location: [facility];  Service: Vascular;  Laterality: Left;   APPLICATION OF WOUND VAC  10/16/2021   Procedure: APPLICATION OF WOUND VAC;  Surgeon: Celso College, MD;  Location: [facility];  Service: Vascular;;   CATARACT EXTRACTION W/PHACO Right 03/27/2020   Procedure: CATARACT  EXTRACTION PHACO AND INTRAOCULAR LENS PLACEMENT (IOC) RIGHT DIABETIC 7.54 00:52.5;  Surgeon: Clair Crews, MD;  Location: [facility];  Service: Ophthalmology;  Laterality: Right;   CATARACT EXTRACTION W/PHACO Left 05/20/2020   Procedure: CATARACT EXTRACTION PHACO AND INTRAOCULAR LENS PLACEMENT (IOC) LEFT DIABETIC 4.95 00:37.6;  Surgeon: Clair Crews, MD;  Location: [facility];  Service: Ophthalmology;  Laterality: Left;  Diabetic - oral meds COVID + 04-24-20   CHOLECYSTECTOMY     COLONOSCOPY WITH PROPOFOL  N/A 04/16/2019   Procedure: COLONOSCOPY WITH PROPOFOL ;  Surgeon: Irby Mannan, MD;  Location: [facility];  Service: Endoscopy;  Laterality: N/A;   COLONOSCOPY WITH PROPOFOL  N/A 01/16/2020   Procedure: COLONOSCOPY WITH PROPOFOL ;  Surgeon: Irby Mannan, MD;  Location: [facility];  Service: Endoscopy;  Laterality: N/A;   ENDARTERECTOMY FEMORAL Left 10/16/2021   Procedure: ENDARTERECTOMY FEMORAL;  Surgeon: Celso College, MD;  Location: [facility];  Service: Vascular;  Laterality: Left;   ESOPHAGOGASTRODUODENOSCOPY (EGD) WITH PROPOFOL  N/A 06/15/2018   Procedure: ESOPHAGOGASTRODUODENOSCOPY (EGD) WITH PROPOFOL ;  Surgeon: [place], Alphonsus Jeans, MD;  Location: [facility];  Service: Gastroenterology;  Laterality: N/A;   ESOPHAGOGASTRODUODENOSCOPY (EGD) WITH PROPOFOL  N/A 01/03/2019   Procedure: ESOPHAGOGASTRODUODENOSCOPY (EGD) WITH PROPOFOL ;  Surgeon: Marnee Sink, MD;  Location: [facility];  Service: Endoscopy;  Laterality: N/A;   ESOPHAGOGASTRODUODENOSCOPY (EGD) WITH PROPOFOL  N/A 09/25/2021   Procedure: ESOPHAGOGASTRODUODENOSCOPY (EGD) WITH PROPOFOL ;  Surgeon: Marnee Sink, MD;  Location: [facility];  Service: Endoscopy;  Laterality: N/A;   EYE SURGERY Bilateral    GIVENS CAPSULE STUDY  04/16/2019   Procedure: GIVENS CAPSULE STUDY;  Surgeon: Irby Mannan, MD;  Location: [facility];  Service: Endoscopy;;   GIVENS CAPSULE STUDY N/A  06/25/2019   Procedure: GIVENS CAPSULE STUDY;  Surgeon: Luke Salaam, MD;  Location: [facility];  Service: Gastroenterology;  Laterality: N/A;   INCISION AND DRAINAGE OF WOUND Left 02/24/2022   Procedure: LEFT BKA IRRIGATION AND DEBRIDEMENT WOUND;  Surgeon: Celso College, MD;  Location: [facility];  Service: Vascular;  Laterality: Left;   LOWER EXTREMITY ANGIOGRAPHY Right 08/21/2018   Procedure: LOWER EXTREMITY ANGIOGRAPHY;  Surgeon: Celso College, MD;  Location: [facility];  Service: Cardiovascular;  Laterality: Right;   LOWER EXTREMITY ANGIOGRAPHY Left 08/28/2018   Procedure: LOWER EXTREMITY ANGIOGRAPHY;  Surgeon: Celso College, MD;  Location: [facility];  Service: Cardiovascular;  Laterality: Left;   LOWER EXTREMITY ANGIOGRAPHY Right 08/28/2018   Procedure: Lower Extremity Angiography;  Surgeon: Celso College, MD;  Location: [facility];  Service: Cardiovascular;  Laterality: Right;   LOWER EXTREMITY ANGIOGRAPHY Right 12/18/2018   Procedure: Lower Extremity Angiography;  Surgeon: Celso College, MD;  Location: [facility];  Service: Cardiovascular;  Laterality: Right;   LOWER EXTREMITY ANGIOGRAPHY Right 12/18/2018   Procedure: Lower Extremity Angiography;  Surgeon: Celso College, MD;  Location: [facility];  Service: Cardiovascular;  Laterality: Right;   LOWER EXTREMITY ANGIOGRAPHY Left 12/21/2018   Procedure: Lower Extremity Angiography;  Surgeon: Celso College, MD;  Location: [facility];  Service: Cardiovascular;  Laterality: Left;   LOWER EXTREMITY ANGIOGRAPHY Right 12/21/2018   Procedure: Lower Extremity Angiography;  Surgeon: Celso College, MD;  Location: [facility];  Service: Cardiovascular;  Laterality: Right;   LOWER EXTREMITY ANGIOGRAPHY Left 12/25/2020   Procedure: LOWER EXTREMITY ANGIOGRAPHY;  Surgeon: Celso College, MD;  Location: [facility];  Service: Cardiovascular;  Laterality: Left;   LOWER EXTREMITY  ANGIOGRAPHY Left 09/21/2021   Procedure: Lower Extremity Angiography;  Surgeon: Celso College, MD;  Location: [facility];  Service: Cardiovascular;  Laterality: Left;   LOWER EXTREMITY ANGIOGRAPHY Left 10/13/2021   Procedure: Lower Extremity Angiography;  Surgeon: Jackquelyn Mass, MD;  Location: [facility];  Service: Cardiovascular;  Laterality: Left;   LOWER EXTREMITY ANGIOGRAPHY Left 10/14/2021   Procedure: Lower Extremity Angiography;  Surgeon: Celso College, MD;  Location: [facility];  Service: Cardiovascular;  Laterality: Left;   LOWER EXTREMITY ANGIOGRAPHY Left 11/02/2021   Procedure: Lower Extremity Angiography;  Surgeon: Celso College, MD;  Location: [facility];  Service: Cardiovascular;  Laterality: Left;   LOWER EXTREMITY INTERVENTION N/A 12/22/2018   Procedure: LOWER EXTREMITY INTERVENTION;  Surgeon: Celso College, MD;  Location: [facility];  Service: Cardiovascular;  Laterality: N/A;   LOWER EXTREMITY INTERVENTION Left 12/26/2020   Procedure: LOWER EXTREMITY INTERVENTION;  Surgeon: Celso College, MD;  Location: [facility];  Service: Cardiovascular;  Laterality: Left;   LOWER EXTREMITY INTERVENTION N/A 09/22/2021   Procedure: LOWER EXTREMITY INTERVENTION;  Surgeon: Celso College, MD;  Location: [facility];  Service: Cardiovascular;  Laterality: N/A;   Patient Active Problem List  Diagnosis Date Noted   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) 11/04/2022   Amputation above knee (HCC) 11/04/2022   Cellulitis of left lower extremity 02/23/2022   Sepsis due to cellulitis (HCC) 02/23/2022   Dyslipidemia 02/23/2022   Rheumatoid arthritis (HCC) 02/23/2022   Essential hypertension 02/23/2022   Type 2 diabetes mellitus with peripheral neuropathy (HCC) 02/23/2022   Multiple duodenal ulcers    Ischemic leg 12/25/2020   Rheumatoid arthritis of 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)  08/13/2020   Diabetes mellitus without complication (HCC)    GERD (gastroesophageal reflux disease)    COPD (chronic obstructive pulmonary disease) (HCC)    GIB (gastrointestinal bleeding)    HLD (hyperlipidemia)    Depression    Elevated troponin    Syncope    Elevated troponin I level    Anemia of chronic disease 05/16/2019   Dizziness    Polyp of colon    Acute upper GI bleeding 04/13/2019   Anemia associated with acute blood loss 04/13/2019   Chronic anticoagulation 04/13/2019   Hx of right BKA (HCC) 04/13/2019   Symptomatic anemia 04/13/2019   Upper GI bleed 04/13/2019   Acute GI bleeding 04/13/2019   Phantom pain after amputation of lower extremity (HCC) 01/30/2019   Melena    DU (duodenal ulcer)    DKA (diabetic ketoacidosis) (HCC) 12/14/2018   Atherosclerosis of artery of extremity with rest pain (HCC) 08/28/2018   PAD (peripheral artery disease) (HCC) 07/14/2018   Abdominal pain 06/13/2018   HTN (hypertension) 06/13/2018   Non-insulin  dependent type 2 diabetes mellitus (HCC) 06/13/2018   Elevated lactic acid level 06/13/2018    PCP: [facility]  REFERRING PROVIDER: Valene Gash, NP  REFERRING DIAG:  [phone] (ICD-10-CM) - Hx of right BKA (HCC)  Z89.512 (ICD-10-CM) - Hx of BKA, left (HCC)    THERAPY DIAG:  Difficulty in walking, not elsewhere classified  Muscle weakness (generalized)  Imbalance  History of amputation below knee, right (HCC)  History of above knee amputation, left (HCC)  Rationale for Evaluation and Treatment: Rehabilitation  ONSET DATE: L AKA 11/04/22   PERTINENT HISTORY: Pt is a 68 year old female s/p L AKA 11/04/22. Pt has received above-knee prosthesis for L. Pt had R BKA in Sept 2020 with prosthetic rehab in this clinic. Pt is proficient with R below-knee prosthesis.  Patient reports standing with her prosthesis at home with her sister and walking with it some at home. Pt reports dealing with phantom limb  pain. Patient reports getting this in R BKA and L AKA limbs. Patient reports Gabapentin  helps to control phantom limb symptoms. Pt had R BKA 4-5 years ago. Pt reports some tenderness along end of L residual limb. Pt reports that she has not used her prosthesis much over previous 2 weeks. She reports using shrinker each day and completing regular checks on skin integrity for L residual limb.  Type of amputation: Above-knee amputation  Onset: L AKA 11/04/22 Follow-up appt with MD: Pt following up with [facility] for check-up between May and June Pain: 710 Present, 10/10 Worst: (Pain along L hip flexor/L groin) Phantom pain: Yes History of previous amputations/PAD: Yes; Hx of R BKA  Pain quality: pain quality: sharp Numbness/Tingling: Yes Imaging: No    WEIGHT BEARING RESTRICTIONS: No  FALLS:  Has patient fallen in last 6 months? No  LIVING ENVIRONMENT: Lives with:  sister, great granddaughter 28 y/o, significant other  Lives in: House/apartment; 3 steps in  front to get into home; temporary living until pt finds home with flat entryway;  Shower chair in bathroom; pt able to get in/out of bathroom Stairs:  stairs into basement only, pt does not go there often Has following equipment at home: Otho Blitz - 2 wheeled, Wheelchair (power), Wheelchair (manual), shower chair, and Grab bars; sliding board for W/C transfers  PLOF: Independent with household mobility with device  PATIENT GOALS: Get back to walking and improved independence with L AKA prosthesis  NEXT MD VISIT: Between May-June   OBJECTIVE:  Note: Objective measures were completed at Evaluation unless otherwise noted.  MUSCULOSKELETAL: Tremor: Absent Tone: Normal, no spasticity, rigidity, or clonus Residual limb appears well-healing with no signs of erythema, skin breakdown, infection, or drainage  Posture Forward flexed posture in static standing  Palpation No pain to palpation along distal surface of residual limb.  No  pain with palpation to quadriceps or hamstrings.    PATIENT SURVEYS:  ABC scale 6.3%  COGNITION: Overall cognitive status: Within functional limits for tasks assessed     SENSATION: Not tested   LOWER EXTREMITY ROM:  Active ROM Right eval Left eval  Hip flexion WNL WNL  Hip extension WNL -5  Hip abduction 40 40  Hip adduction    Hip internal rotation    Hip external rotation    Knee flexion    Knee extension    Ankle dorsiflexion    Ankle plantarflexion    Ankle inversion    Ankle eversion     (Blank rows = not tested)  LOWER EXTREMITY MMT:  MMT Right eval Left eval  Hip flexion 4+ 4  Hip extension 4 4  Hip abduction 4+ 4+  Hip adduction    Hip internal rotation    Hip external rotation    Knee flexion 4+   Knee extension 4+   Ankle dorsiflexion    Ankle plantarflexion    Ankle inversion    Ankle eversion     (Blank rows = not tested)   FUNCTIONAL TASKS:  W/C to bed transfer: CGA with sit to stand, pt presents with heavy forward lean and weight shift toward RLE, heavy upper limb push on FWW once pt has hands on walker. Heavy verbal cueing to push from seat versus walker for transferring.  Sit to stand: Difficulty initiating with slowed velocity of first 25% of transfer following lift from mat.   GAIT: Distance walked: 60 ft Assistive device utilized: Walker - 2 wheeled Level of assistance: CGA Comments: Heavy upper limb support on walker. Step-to pattern with RLE leading. Decreased terminal knee extension of L prosthesis at initial contact that can be partially corrected with verbal cueing for swing technique. Decreased LLE weight shift during stance phase. Decreased hip extension during mid-stance to terminal stance.  TREATMENT DATE: 08/11/2023     SUBJECTIVE STATEMENT:   Patient reports no further updates at arrival this  AM. She is working on wearing prostheses up to 1 hour in AM and PM.     Therapeutic Activities - patient education, repetitive task practice for improved performance of daily functional activities e.g. transferring, weight transfer practice to improve static standing activities and weight shift during ambulation   Ambulate around gym with front-wheeled walker; 2 laps  -educated patient on availability of sliders/gliders to improve mobility of FWW  -PT SBA Ambulate with rollator; 3 laps around gym  -Pt demonstration for technique, and verbal/tactile cueing for use of handbrakes for transferring and preparing to ambulate  -PT CGA  Gait to sidewalk and down decline/up incline (in front of building/through breezeway); PT CGA; x 8 minutes  Alternating toe tap, 6-inch step, with unilateral R upper limb suppport; 2x10 alt   PATIENT EDUCATION: Discussed current progress and POC.   *next visit* Functional reach with no UE support (intermittent contact with bar if losing balance); 8 cones - stacking and unstacking, placing on adjacent raised table; x 5 minutes  *not today* Forward gait in bars with bilateral UE support and practicing step-through versus step-to gait  -PT SBA, cueing for LLE swing Forward gait in bars with unilateral R upper limb support, step-to pattern with LLE first; x 3 D/B length of bars  -bilat UE support for 180-degree turn at end of bars Forward step-up; RLE first with LLE trailing; x 8 reps Sit to stand with chair + Airex; task practice to reduce upper limb assistance and controlled descent; x 5 reps Weight shifting with bilateral symmetrical stance and no UE support alternating R/L; x 60 sec   PATIENT EDUCATION:  Education details: see above for patient education details Person educated: Patient Education method: Explanation, Demonstration, and Handouts Education comprehension: verbalized understanding and returned demonstration  HOME EXERCISE PROGRAM: Access  Code: [email or web address] URL: [email or web address] Date: 06/23/2023 Prepared by: Denese Finn  Exercises - Prone Hip Flexor Stretch with Towel Roll (AKA)  - 2-3 x daily - 7 x weekly - 1 sets - 2-3 min hold - Supine Hip Abduction  - 2 x daily - 7 x weekly - 2 sets - 10 reps - Sidelying Hip Abduction (AKA)  - 2 x daily - 7 x weekly - 2 sets - 10 reps - Prone Hip Extension with Residual Limb (AKA)  - 2 x daily - 7 x weekly - 2 sets - 10 reps  ASSESSMENT:  CLINICAL IMPRESSION: We focused today on potential modifications to AD use to allow for more-continuous gait pattern e.g. use of rollator or FWW with sliders added to decrease friction with floor. Pt exhibits improving step-through pattern following stepping forward with LLE above-knee prosthesis. Pt is still significantly fatigued following ambulation trials with bilat prosthesis given significant energy expenditure. Patient presents with prosthetic gait deviations, hip weakness, imbalance, and difficulty with weight shifting/transfers. Pt will continue to benefit from skilled PT services to address deficits and improve function.  OBJECTIVE IMPAIRMENTS: Abnormal gait, decreased balance, decreased mobility, difficulty walking, decreased strength, prosthetic dependency , and pain.   ACTIVITY LIMITATIONS: carrying, standing, stairs, transfers, bed mobility, and locomotion level  PARTICIPATION LIMITATIONS: cleaning, laundry, shopping, and community activity  PERSONAL FACTORS: Age, Past/current experiences, and 3+ comorbidities: (anxiety, depression, CKD, COPD, DM, DKA, HTN lower extremity ischemia)  are also affecting patient's functional outcome.   REHAB POTENTIAL: Good  CLINICAL DECISION MAKING: Evolving/moderate complexity  EVALUATION COMPLEXITY: Moderate  GOALS: Goals reviewed with patient? Yes  SHORT TERM GOALS: Target date: 07/14/23 Patient will be independent and 100% compliant with HEP as needed for carryover of PT  intervention Baseline: 06/21/23: Baseline HEP reviewed.  08/04/23: largely compliant with HEP.  Goal status: IN PROGRESS/MOSTLY MET  2.  Patient will attain L hip hyperextension to 15 deg indicative of improved ROM required for terminal stance/heel off during gait Baseline: 06/21/23: L hip extension -5.   08/04/23: 5 deg hyperextension Goal status: IN PROGRESS  3.  Patient will demonstrate modified-independent sit to stand and stand-pivot transfer from W/C to adjacent sitting surface with no LOB and no verbal cueing required from therapist for technique Baseline: 06/21/23: Difficulty and some cueing needed for safety with transferring and stand-pivot transfer.   08/04/23: completed with supervision level of assist  Goal status: IN PROGRESS/MOSTLY MET   LONG TERM GOALS: Target date: 09/01/23  Patient will ambulate at community-level distance with front-wheeled walker ModI with no LOB and normalized hip extension and swing through for L above-knee prosthesis Baseline: 06/21/23: Noted gait deviations and pt ambulating short of household distance with L AKA prosthesis.   08/04/23: Ambulates for home distance with CGA. Limited capacity for community-level distance walking with FWW.  Goal status: IN PROGRESS  2.  Pt will improve ABC Scale by 13% or greater indicative of improved confidence related to balance and fall risk and clinically meaningful change Baseline: 06/21/23: ABC Scale to be obtained on visit # 2.   06/27/23: 6.3%  08/04/23: 8.8% Goal status: NOT MET  3.  Pt will perform TUG in less than 20 seconds indicative of decreased risk of falls relative to patient-specific population Baseline: 06/21/23: TUG to be completed on visit # 2.   06/27/23: 136 sec.  08/04/23: Deferred to next visit.    08/09/23: 59.1 sec.  Goal status: IN PROGRESS  4.  Pt will improve Amputee Mobility Predictor by 4 points or greater indicative of detectable change in ADLs and functional mobility with prostheses.  Baseline: 06/21/23:  AMPPRO to be completed on visits #2-3.  07/06/23: 23/47 08/04/23:  22/47. Goal status: NOT MET   5.  Patient will negotiate 3 steps ModI with FWW as needed for entering/exiting her home. Baseline: 06/21/23: Significant difficulty with stair negotiation.  08/04/23: Completed today with ModA x 2 with heavy upper limb assist.  Goal status: IN PROGRESS    PLAN:  PT FREQUENCY: 2x/week  PT DURATION: 10 weeks  PLANNED INTERVENTIONS: 97164- PT Re-evaluation, 97110-Therapeutic exercises, 97530- Therapeutic activity, [id number]- Neuromuscular re-education, 97535- Self Care, [id number]- Prosthetic training, Patient/Family education, and Balance training  PLAN FOR NEXT SESSION: Continue with weight shifting and gait training with new L AKA prosthesis, correcting gait deviations. Progress with lessening upper limb support as able to negotiating various obstacles/terrain with bilat prostheses. Open-chain strengthening for BLE.    Denese Finn, PT, DPT #[id number]  Aleatha Hunting, PT 08/11/2023, 9:53 AM

## 2023-08-16 ENCOUNTER — Encounter: Payer: Self-pay | Admitting: Physical Therapy

## 2023-08-16 ENCOUNTER — Ambulatory Visit: Payer: 59 | Admitting: Physical Therapy

## 2023-08-17 NOTE — Therapy (Signed)
 OUTPATIENT PHYSICAL THERAPY TREATMENT    Patient Name: Sylvia [name] MRN: [id number] DOB:1955-10-30, 68 y.o., female Today's Date: 08/18/2023   END OF SESSION:  PT End of Session - 08/18/23 0902     Visit Number 13    Number of Visits 21    Date for PT Re-Evaluation 09/01/23    Authorization Type [employer]    PT Start Time 0903    PT Stop Time 0955    PT Time Calculation (min) 52 min    Equipment Utilized During Treatment Gait belt   patient's personal FWW   Activity Tolerance Patient limited by fatigue;Patient tolerated treatment well   c/o bilat phantom limb pain   Behavior During Therapy WFL for tasks assessed/performed                Past Medical History:  Diagnosis Date   Anemia of chronic disease 05/16/2019   Anxiety    h/o   Arthritis    Chronic kidney disease    Cocaine abuse (HCC)    COPD (chronic obstructive pulmonary disease) (HCC)    Coronary artery disease    Depression    Diabetes mellitus without complication (HCC)    DKA (diabetic ketoacidosis) (HCC)    Elevated troponin    GERD (gastroesophageal reflux disease)    GI bleed    Hypertension    bp under control-off meds since 2019   Ischemic leg    Past Surgical History:  Procedure Laterality Date   ABDOMINAL HYSTERECTOMY     AMPUTATION Right 12/27/2018   Procedure: AMPUTATION BELOW KNEE;  Surgeon: Celso College, MD;  Location: [facility];  Service: General;  Laterality: Right;   AMPUTATION Left 11/05/2021   Procedure: AMPUTATION BELOW KNEE;  Surgeon: Celso College, MD;  Location: [facility];  Service: Vascular;  Laterality: Left;   AMPUTATION Left 11/04/2022   Procedure: AMPUTATION ABOVE KNEE;  Surgeon: Celso College, MD;  Location: [facility];  Service: Vascular;  Laterality: Left;   APPLICATION OF WOUND VAC  10/16/2021   Procedure: APPLICATION OF WOUND VAC;  Surgeon: Celso College, MD;  Location: [facility];  Service: Vascular;;   CATARACT EXTRACTION W/PHACO Right 03/27/2020   Procedure:  CATARACT EXTRACTION PHACO AND INTRAOCULAR LENS PLACEMENT (IOC) RIGHT DIABETIC 7.54 00:52.5;  Surgeon: Clair Crews, MD;  Location: [facility];  Service: Ophthalmology;  Laterality: Right;   CATARACT EXTRACTION W/PHACO Left 05/20/2020   Procedure: CATARACT EXTRACTION PHACO AND INTRAOCULAR LENS PLACEMENT (IOC) LEFT DIABETIC 4.95 00:37.6;  Surgeon: Clair Crews, MD;  Location: [facility];  Service: Ophthalmology;  Laterality: Left;  Diabetic - oral meds COVID + 04-24-20   CHOLECYSTECTOMY     COLONOSCOPY WITH PROPOFOL  N/A 04/16/2019   Procedure: COLONOSCOPY WITH PROPOFOL ;  Surgeon: Irby Mannan, MD;  Location: [facility];  Service: Endoscopy;  Laterality: N/A;   COLONOSCOPY WITH PROPOFOL  N/A 01/16/2020   Procedure: COLONOSCOPY WITH PROPOFOL ;  Surgeon: Irby Mannan, MD;  Location: [facility];  Service: Endoscopy;  Laterality: N/A;   ENDARTERECTOMY FEMORAL Left 10/16/2021   Procedure: ENDARTERECTOMY FEMORAL;  Surgeon: Celso College, MD;  Location: [facility];  Service: Vascular;  Laterality: Left;   ESOPHAGOGASTRODUODENOSCOPY (EGD) WITH PROPOFOL  N/A 06/15/2018   Procedure: ESOPHAGOGASTRODUODENOSCOPY (EGD) WITH PROPOFOL ;  Surgeon: Tol[place], Alphonsus Jeans, MD;  Location: [facility];  Service: Gastroenterology;  Laterality: N/A;   ESOPHAGOGASTRODUODENOSCOPY (EGD) WITH PROPOFOL  N/A 01/03/2019   Procedure: ESOPHAGOGASTRODUODENOSCOPY (EGD) WITH PROPOFOL ;  Surgeon: Marnee Sink, MD;  Location: [facility];  Service:  Endoscopy;  Laterality: N/A;   ESOPHAGOGASTRODUODENOSCOPY (EGD) WITH PROPOFOL  N/A 09/25/2021   Procedure: ESOPHAGOGASTRODUODENOSCOPY (EGD) WITH PROPOFOL ;  Surgeon: Marnee Sink, MD;  Location: [facility];  Service: Endoscopy;  Laterality: N/A;   EYE SURGERY Bilateral    GIVENS CAPSULE STUDY  04/16/2019   Procedure: GIVENS CAPSULE STUDY;  Surgeon: Irby Mannan, MD;  Location: [facility];  Service: Endoscopy;;   GIVENS CAPSULE STUDY N/A  06/25/2019   Procedure: GIVENS CAPSULE STUDY;  Surgeon: Luke Salaam, MD;  Location: [facility];  Service: Gastroenterology;  Laterality: N/A;   INCISION AND DRAINAGE OF WOUND Left 02/24/2022   Procedure: LEFT BKA IRRIGATION AND DEBRIDEMENT WOUND;  Surgeon: Celso College, MD;  Location: [facility];  Service: Vascular;  Laterality: Left;   LOWER EXTREMITY ANGIOGRAPHY Right 08/21/2018   Procedure: LOWER EXTREMITY ANGIOGRAPHY;  Surgeon: Celso College, MD;  Location: [facility];  Service: Cardiovascular;  Laterality: Right;   LOWER EXTREMITY ANGIOGRAPHY Left 08/28/2018   Procedure: LOWER EXTREMITY ANGIOGRAPHY;  Surgeon: Celso College, MD;  Location: [facility];  Service: Cardiovascular;  Laterality: Left;   LOWER EXTREMITY ANGIOGRAPHY Right 08/28/2018   Procedure: Lower Extremity Angiography;  Surgeon: Celso College, MD;  Location: [facility];  Service: Cardiovascular;  Laterality: Right;   LOWER EXTREMITY ANGIOGRAPHY Right 12/18/2018   Procedure: Lower Extremity Angiography;  Surgeon: Celso College, MD;  Location: [facility];  Service: Cardiovascular;  Laterality: Right;   LOWER EXTREMITY ANGIOGRAPHY Right 12/18/2018   Procedure: Lower Extremity Angiography;  Surgeon: Celso College, MD;  Location: [facility];  Service: Cardiovascular;  Laterality: Right;   LOWER EXTREMITY ANGIOGRAPHY Left 12/21/2018   Procedure: Lower Extremity Angiography;  Surgeon: Celso College, MD;  Location: [facility];  Service: Cardiovascular;  Laterality: Left;   LOWER EXTREMITY ANGIOGRAPHY Right 12/21/2018   Procedure: Lower Extremity Angiography;  Surgeon: Celso College, MD;  Location: [facility];  Service: Cardiovascular;  Laterality: Right;   LOWER EXTREMITY ANGIOGRAPHY Left 12/25/2020   Procedure: LOWER EXTREMITY ANGIOGRAPHY;  Surgeon: Celso College, MD;  Location: [facility];  Service: Cardiovascular;  Laterality: Left;   LOWER EXTREMITY  ANGIOGRAPHY Left 09/21/2021   Procedure: Lower Extremity Angiography;  Surgeon: Celso College, MD;  Location: [facility];  Service: Cardiovascular;  Laterality: Left;   LOWER EXTREMITY ANGIOGRAPHY Left 10/13/2021   Procedure: Lower Extremity Angiography;  Surgeon: Jackquelyn Mass, MD;  Location: [facility];  Service: Cardiovascular;  Laterality: Left;   LOWER EXTREMITY ANGIOGRAPHY Left 10/14/2021   Procedure: Lower Extremity Angiography;  Surgeon: Celso College, MD;  Location: [facility];  Service: Cardiovascular;  Laterality: Left;   LOWER EXTREMITY ANGIOGRAPHY Left 11/02/2021   Procedure: Lower Extremity Angiography;  Surgeon: Celso College, MD;  Location: [facility];  Service: Cardiovascular;  Laterality: Left;   LOWER EXTREMITY INTERVENTION N/A 12/22/2018   Procedure: LOWER EXTREMITY INTERVENTION;  Surgeon: Celso College, MD;  Location: [facility];  Service: Cardiovascular;  Laterality: N/A;   LOWER EXTREMITY INTERVENTION Left 12/26/2020   Procedure: LOWER EXTREMITY INTERVENTION;  Surgeon: Celso College, MD;  Location: [facility];  Service: Cardiovascular;  Laterality: Left;   LOWER EXTREMITY INTERVENTION N/A 09/22/2021   Procedure: LOWER EXTREMITY INTERVENTION;  Surgeon: Celso College, MD;  Location: [facility];  Service: Cardiovascular;  Laterality: N/A;   Patient Active Problem  List   Diagnosis Date Noted   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) 11/04/2022   Amputation above knee (HCC) 11/04/2022   Cellulitis of left lower extremity 02/23/2022   Sepsis due to cellulitis (HCC) 02/23/2022   Dyslipidemia 02/23/2022   Rheumatoid arthritis (HCC) 02/23/2022   Essential hypertension 02/23/2022   Type 2 diabetes mellitus with peripheral neuropathy (HCC) 02/23/2022   Multiple duodenal ulcers    Ischemic leg 12/25/2020   Rheumatoid arthritis of 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)  08/13/2020   Diabetes mellitus without complication (HCC)    GERD (gastroesophageal reflux disease)    COPD (chronic obstructive pulmonary disease) (HCC)    GIB (gastrointestinal bleeding)    HLD (hyperlipidemia)    Depression    Elevated troponin    Syncope    Elevated troponin I level    Anemia of chronic disease 05/16/2019   Dizziness    Polyp of colon    Acute upper GI bleeding 04/13/2019   Anemia associated with acute blood loss 04/13/2019   Chronic anticoagulation 04/13/2019   Hx of right BKA (HCC) 04/13/2019   Symptomatic anemia 04/13/2019   Upper GI bleed 04/13/2019   Acute GI bleeding 04/13/2019   Phantom pain after amputation of lower extremity (HCC) 01/30/2019   Melena    DU (duodenal ulcer)    DKA (diabetic ketoacidosis) (HCC) 12/14/2018   Atherosclerosis of artery of extremity with rest pain (HCC) 08/28/2018   PAD (peripheral artery disease) (HCC) 07/14/2018   Abdominal pain 06/13/2018   HTN (hypertension) 06/13/2018   Non-insulin  dependent type 2 diabetes mellitus (HCC) 06/13/2018   Elevated lactic acid level 06/13/2018    PCP: [facility]  REFERRING PROVIDER: Valene Gash, NP  REFERRING DIAG:  [phone] (ICD-10-CM) - Hx of right BKA (HCC)  Z89.512 (ICD-10-CM) - Hx of BKA, left (HCC)    THERAPY DIAG:  Difficulty in walking, not elsewhere classified  Muscle weakness (generalized)  Imbalance  History of amputation below knee, right (HCC)  History of above knee amputation, left (HCC)  Rationale for Evaluation and Treatment: Rehabilitation  ONSET DATE: L AKA 11/04/22   PERTINENT HISTORY: Pt is a 68 year old female s/p L AKA 11/04/22. Pt has received above-knee prosthesis for L. Pt had R BKA in Sept 2020 with prosthetic rehab in this clinic. Pt is proficient with R below-knee prosthesis.  Patient reports standing with her prosthesis at home with her sister and walking with it some at home. Pt reports dealing with phantom limb  pain. Patient reports getting this in R BKA and L AKA limbs. Patient reports Gabapentin  helps to control phantom limb symptoms. Pt had R BKA 4-5 years ago. Pt reports some tenderness along end of L residual limb. Pt reports that she has not used her prosthesis much over previous 2 weeks. She reports using shrinker each day and completing regular checks on skin integrity for L residual limb.  Type of amputation: Above-knee amputation  Onset: L AKA 11/04/22 Follow-up appt with MD: Pt following up with [facility] for check-up between May and June Pain: 710 Present, 10/10 Worst: (Pain along L hip flexor/L groin) Phantom pain: Yes History of previous amputations/PAD: Yes; Hx of R BKA  Pain quality: pain quality: sharp Numbness/Tingling: Yes Imaging: No    WEIGHT BEARING RESTRICTIONS: No  FALLS:  Has patient fallen in last 6 months? No  LIVING ENVIRONMENT: Lives with:  sister, great granddaughter 73 y/o, significant other  Lives in: House/apartment;  3 steps in front to get into home; temporary living until pt finds home with flat entryway;  Shower chair in bathroom; pt able to get in/out of bathroom Stairs:  stairs into basement only, pt does not go there often Has following equipment at home: Otho Blitz - 2 wheeled, Wheelchair (power), Wheelchair (manual), shower chair, and Grab bars; sliding board for W/C transfers  PLOF: Independent with household mobility with device  PATIENT GOALS: Get back to walking and improved independence with L AKA prosthesis  NEXT MD VISIT: Between May-June   OBJECTIVE:  Note: Objective measures were completed at Evaluation unless otherwise noted.  MUSCULOSKELETAL: Tremor: Absent Tone: Normal, no spasticity, rigidity, or clonus Residual limb appears well-healing with no signs of erythema, skin breakdown, infection, or drainage  Posture Forward flexed posture in static standing  Palpation No pain to palpation along distal surface of residual limb.  No  pain with palpation to quadriceps or hamstrings.    PATIENT SURVEYS:  ABC scale 6.3%  COGNITION: Overall cognitive status: Within functional limits for tasks assessed     SENSATION: Not tested   LOWER EXTREMITY ROM:  Active ROM Right eval Left eval  Hip flexion WNL WNL  Hip extension WNL -5  Hip abduction 40 40  Hip adduction    Hip internal rotation    Hip external rotation    Knee flexion    Knee extension    Ankle dorsiflexion    Ankle plantarflexion    Ankle inversion    Ankle eversion     (Blank rows = not tested)  LOWER EXTREMITY MMT:  MMT Right eval Left eval  Hip flexion 4+ 4  Hip extension 4 4  Hip abduction 4+ 4+  Hip adduction    Hip internal rotation    Hip external rotation    Knee flexion 4+   Knee extension 4+   Ankle dorsiflexion    Ankle plantarflexion    Ankle inversion    Ankle eversion     (Blank rows = not tested)   FUNCTIONAL TASKS:  W/C to bed transfer: CGA with sit to stand, pt presents with heavy forward lean and weight shift toward RLE, heavy upper limb push on FWW once pt has hands on walker. Heavy verbal cueing to push from seat versus walker for transferring.  Sit to stand: Difficulty initiating with slowed velocity of first 25% of transfer following lift from mat.   GAIT: Distance walked: 60 ft Assistive device utilized: Walker - 2 wheeled Level of assistance: CGA Comments: Heavy upper limb support on walker. Step-to pattern with RLE leading. Decreased terminal knee extension of L prosthesis at initial contact that can be partially corrected with verbal cueing for swing technique. Decreased LLE weight shift during stance phase. Decreased hip extension during mid-stance to terminal stance.  TREATMENT DATE: 08/18/2023     SUBJECTIVE STATEMENT:   Patient reports no further updates at arrival this  AM. She reports not wearing her prosthesis at home over this past week - only donning it for PT. She is compliant with exercises in bed to work on hip strengthening and hip extension mobility. Pt has not yet swapped out rubber stoppers on back of FWW for sliders.     Therapeutic Activities - patient education, repetitive task practice for improved performance of daily functional activities e.g. transferring, weight transfer practice to improve static standing activities and weight shift during ambulation   Ambulate around gym with front-wheeled walker, with sliders on rear legs; 2 laps  -PT SBA Ambulate with rollator; 3 laps around gym  -Pt demonstration for technique, and verbal/tactile cueing for use of handbrakes for transferring and preparing to ambulate  -PT CGA  Forward gait in bars with unilateral R upper limb support, step-to pattern with LLE first; x 3 D/B length of bars  -bilat UE support for 180-degree turn at end of bars  Alternating toe tap, 6-inch step, with bilateral upper limb support; 2x10 alt Alternating toe tap, 6-inch step, with unilateral R upper limb support; 2x10 alt  Gait from breezeway to side entrance of PT gym; 1 stop on chair set along sidewalk due to notable fatigue  -PT CGA  -Verbal cueing for prosthesis placement and ensuring adequate L AKA prosthetic knee extension for stability     PATIENT EDUCATION: Discussed current progress and POC.   *next visit* Functional reach with no UE support (intermittent contact with bar if losing balance); 8 cones - stacking and unstacking, placing on adjacent raised table; x 5 minutes  *not today* Forward gait in bars with bilateral UE support and practicing step-through versus step-to gait  -PT SBA, cueing for LLE swing  Forward step-up; RLE first with LLE trailing; x 8 reps Sit to stand with chair + Airex; task practice to reduce upper limb assistance and controlled descent; x 5 reps Weight shifting with bilateral  symmetrical stance and no UE support alternating R/L; x 60 sec   PATIENT EDUCATION:  Education details: see above for patient education details Person educated: Patient Education method: Explanation, Demonstration, and Handouts Education comprehension: verbalized understanding and returned demonstration  HOME EXERCISE PROGRAM: Access Code: [email or web address] URL: [email or web address] Date: 06/23/2023 Prepared by: Denese Finn  Exercises - Prone Hip Flexor Stretch with Towel Roll (AKA)  - 2-3 x daily - 7 x weekly - 1 sets - 2-3 min hold - Supine Hip Abduction  - 2 x daily - 7 x weekly - 2 sets - 10 reps - Sidelying Hip Abduction (AKA)  - 2 x daily - 7 x weekly - 2 sets - 10 reps - Prone Hip Extension with Residual Limb (AKA)  - 2 x daily - 7 x weekly - 2 sets - 10 reps  ASSESSMENT:  CLINICAL IMPRESSION: We are continuing to work on gait with AD that allows for more mobility and more-continuous gait pattern. Pt exhibits safe pattern with use of rollator and FWW with gliders on rear legs versus rubber stoppers. Pt does not have notable LOB with outdoor ambulation, but she does have significant fatigue from increased energy expenditure required with bilateral prostheses - pt also needs more wear time of prostheses outside of clinic to acclimate to contact pressure on residual limbs and improve gait with more-frequent practice. Patient presents with prosthetic gait deviations, hip weakness, imbalance, and difficulty with weight shifting/transfers. Pt will  continue to benefit from skilled PT services to address deficits and improve function.  OBJECTIVE IMPAIRMENTS: Abnormal gait, decreased balance, decreased mobility, difficulty walking, decreased strength, prosthetic dependency , and pain.   ACTIVITY LIMITATIONS: carrying, standing, stairs, transfers, bed mobility, and locomotion level  PARTICIPATION LIMITATIONS: cleaning, laundry, shopping, and community activity  PERSONAL  FACTORS: Age, Past/current experiences, and 3+ comorbidities: (anxiety, depression, CKD, COPD, DM, DKA, HTN lower extremity ischemia)  are also affecting patient's functional outcome.   REHAB POTENTIAL: Good  CLINICAL DECISION MAKING: Evolving/moderate complexity  EVALUATION COMPLEXITY: Moderate   GOALS: Goals reviewed with patient? Yes  SHORT TERM GOALS: Target date: 07/14/23 Patient will be independent and 100% compliant with HEP as needed for carryover of PT intervention Baseline: 06/21/23: Baseline HEP reviewed.  08/04/23: largely compliant with HEP.  Goal status: IN PROGRESS/MOSTLY MET  2.  Patient will attain L hip hyperextension to 15 deg indicative of improved ROM required for terminal stance/heel off during gait Baseline: 06/21/23: L hip extension -5.   08/04/23: 5 deg hyperextension Goal status: IN PROGRESS  3.  Patient will demonstrate modified-independent sit to stand and stand-pivot transfer from W/C to adjacent sitting surface with no LOB and no verbal cueing required from therapist for technique Baseline: 06/21/23: Difficulty and some cueing needed for safety with transferring and stand-pivot transfer.   08/04/23: completed with supervision level of assist  Goal status: IN PROGRESS/MOSTLY MET   LONG TERM GOALS: Target date: 09/01/23  Patient will ambulate at community-level distance with front-wheeled walker ModI with no LOB and normalized hip extension and swing through for L above-knee prosthesis Baseline: 06/21/23: Noted gait deviations and pt ambulating short of household distance with L AKA prosthesis.   08/04/23: Ambulates for home distance with CGA. Limited capacity for community-level distance walking with FWW.  Goal status: IN PROGRESS  2.  Pt will improve ABC Scale by 13% or greater indicative of improved confidence related to balance and fall risk and clinically meaningful change Baseline: 06/21/23: ABC Scale to be obtained on visit # 2.   06/27/23: 6.3%  08/04/23:  8.8% Goal status: NOT MET  3.  Pt will perform TUG in less than 20 seconds indicative of decreased risk of falls relative to patient-specific population Baseline: 06/21/23: TUG to be completed on visit # 2.   06/27/23: 136 sec.  08/04/23: Deferred to next visit.    08/09/23: 59.1 sec.  Goal status: IN PROGRESS  4.  Pt will improve Amputee Mobility Predictor by 4 points or greater indicative of detectable change in ADLs and functional mobility with prostheses.  Baseline: 06/21/23: AMPPRO to be completed on visits #2-3.  07/06/23: 23/47 08/04/23:  22/47. Goal status: NOT MET   5.  Patient will negotiate 3 steps ModI with FWW as needed for entering/exiting her home. Baseline: 06/21/23: Significant difficulty with stair negotiation.  08/04/23: Completed today with ModA x 2 with heavy upper limb assist.  Goal status: IN PROGRESS    PLAN:  PT FREQUENCY: 2x/week  PT DURATION: 10 weeks  PLANNED INTERVENTIONS: 97164- PT Re-evaluation, 97110-Therapeutic exercises, 97530- Therapeutic activity, [id number]- Neuromuscular re-education, 97535- Self Care, 36644- Prosthetic training, Patient/Family education, and Balance training  PLAN FOR NEXT SESSION: Continue with weight shifting and gait training with new L AKA prosthesis, correcting gait deviations. Progress with lessening upper limb support as able to negotiating various obstacles/terrain with bilat prostheses. Open-chain strengthening for BLE.    Denese Finn, PT, DPT #[id number]  Aleatha Hunting, PT 08/18/2023, 10:31 AM

## 2023-08-18 ENCOUNTER — Ambulatory Visit: Attending: Nurse Practitioner | Admitting: Physical Therapy

## 2023-08-18 ENCOUNTER — Ambulatory Visit: Payer: 59 | Admitting: Physical Therapy

## 2023-08-18 ENCOUNTER — Encounter: Payer: Self-pay | Admitting: Physical Therapy

## 2023-08-18 DIAGNOSIS — Z89511 Acquired absence of right leg below knee: Secondary | ICD-10-CM | POA: Diagnosis present

## 2023-08-18 DIAGNOSIS — M6281 Muscle weakness (generalized): Secondary | ICD-10-CM | POA: Insufficient documentation

## 2023-08-18 DIAGNOSIS — R2689 Other abnormalities of gait and mobility: Secondary | ICD-10-CM | POA: Diagnosis present

## 2023-08-18 DIAGNOSIS — R262 Difficulty in walking, not elsewhere classified: Secondary | ICD-10-CM | POA: Insufficient documentation

## 2023-08-18 DIAGNOSIS — Z89612 Acquired absence of left leg above knee: Secondary | ICD-10-CM | POA: Diagnosis present

## 2023-08-22 ENCOUNTER — Inpatient Hospital Stay: Payer: 59 | Attending: Oncology

## 2023-08-22 DIAGNOSIS — Z7985 Long-term (current) use of injectable non-insulin antidiabetic drugs: Secondary | ICD-10-CM | POA: Insufficient documentation

## 2023-08-22 DIAGNOSIS — M069 Rheumatoid arthritis, unspecified: Secondary | ICD-10-CM | POA: Insufficient documentation

## 2023-08-22 DIAGNOSIS — I129 Hypertensive chronic kidney disease with stage 1 through stage 4 chronic kidney disease, or unspecified chronic kidney disease: Secondary | ICD-10-CM | POA: Insufficient documentation

## 2023-08-22 DIAGNOSIS — D631 Anemia in chronic kidney disease: Secondary | ICD-10-CM | POA: Diagnosis present

## 2023-08-22 DIAGNOSIS — E1122 Type 2 diabetes mellitus with diabetic chronic kidney disease: Secondary | ICD-10-CM | POA: Insufficient documentation

## 2023-08-22 DIAGNOSIS — Z79899 Other long term (current) drug therapy: Secondary | ICD-10-CM | POA: Insufficient documentation

## 2023-08-22 DIAGNOSIS — Z7901 Long term (current) use of anticoagulants: Secondary | ICD-10-CM | POA: Insufficient documentation

## 2023-08-22 DIAGNOSIS — N189 Chronic kidney disease, unspecified: Secondary | ICD-10-CM | POA: Insufficient documentation

## 2023-08-22 DIAGNOSIS — Z79631 Long term (current) use of antimetabolite agent: Secondary | ICD-10-CM | POA: Diagnosis not present

## 2023-08-22 DIAGNOSIS — D638 Anemia in other chronic diseases classified elsewhere: Secondary | ICD-10-CM

## 2023-08-22 DIAGNOSIS — Z7982 Long term (current) use of aspirin: Secondary | ICD-10-CM | POA: Diagnosis not present

## 2023-08-22 LAB — IRON AND TIBC
Iron: 127 ug/dL (ref 28–170)
Saturation Ratios: 38 % — ABNORMAL HIGH (ref 10.4–31.8)
TIBC: 332 ug/dL (ref 250–450)
UIBC: 205 ug/dL

## 2023-08-22 LAB — CBC WITH DIFFERENTIAL (CANCER CENTER ONLY)
Abs Immature Granulocytes: 0.11 10*3/uL — ABNORMAL HIGH (ref 0.00–0.07)
Basophils Absolute: 0.1 10*3/uL (ref 0.0–0.1)
Basophils Relative: 1 %
Eosinophils Absolute: 0.3 10*3/uL (ref 0.0–0.5)
Eosinophils Relative: 3 %
HCT: 30.6 % — ABNORMAL LOW (ref 36.0–46.0)
Hemoglobin: 10.3 g/dL — ABNORMAL LOW (ref 12.0–15.0)
Immature Granulocytes: 1 %
Lymphocytes Relative: 38 %
Lymphs Abs: 3.6 10*3/uL (ref 0.7–4.0)
MCH: 28 pg (ref 26.0–34.0)
MCHC: 33.7 g/dL (ref 30.0–36.0)
MCV: 83.2 fL (ref 80.0–100.0)
Monocytes Absolute: 0.6 10*3/uL (ref 0.1–1.0)
Monocytes Relative: 6 %
Neutro Abs: 5 10*3/uL (ref 1.7–7.7)
Neutrophils Relative %: 51 %
Platelet Count: 406 10*3/uL — ABNORMAL HIGH (ref 150–400)
RBC: 3.68 MIL/uL — ABNORMAL LOW (ref 3.87–5.11)
RDW: 14.2 % (ref 11.5–15.5)
WBC Count: 9.6 10*3/uL (ref 4.0–10.5)
nRBC: 0 % (ref 0.0–0.2)

## 2023-08-22 LAB — RETIC PANEL
Immature Retic Fract: 12.5 % (ref 2.3–15.9)
RBC.: 3.63 MIL/uL — ABNORMAL LOW (ref 3.87–5.11)
Retic Count, Absolute: 64.3 10*3/uL (ref 19.0–186.0)
Retic Ct Pct: 1.8 % (ref 0.4–3.1)
Reticulocyte Hemoglobin: 32.7 pg (ref >27.9)

## 2023-08-22 LAB — FERRITIN: Ferritin: 233 ng/mL (ref 11–307)

## 2023-08-22 NOTE — Therapy (Deleted)
 OUTPATIENT PHYSICAL THERAPY TREATMENT    Patient Name: Sylvia [name] MRN: [id number] DOB:22-Nov-1955, 68 y.o., female Today's Date: 08/22/2023   END OF SESSION:       Past Medical History:  Diagnosis Date   Anemia of chronic disease 05/16/2019   Anxiety    h/o   Arthritis    Chronic kidney disease    Cocaine abuse (HCC)    COPD (chronic obstructive pulmonary disease) (HCC)    Coronary artery disease    Depression    Diabetes mellitus without complication (HCC)    DKA (diabetic ketoacidosis) (HCC)    Elevated troponin    GERD (gastroesophageal reflux disease)    GI bleed    Hypertension    bp under control-off meds since 2019   Ischemic leg    Past Surgical History:  Procedure Laterality Date   ABDOMINAL HYSTERECTOMY     AMPUTATION Right 12/27/2018   Procedure: AMPUTATION BELOW KNEE;  Surgeon: Celso College, MD;  Location: [facility];  Service: General;  Laterality: Right;   AMPUTATION Left 11/05/2021   Procedure: AMPUTATION BELOW KNEE;  Surgeon: Celso College, MD;  Location: [facility];  Service: Vascular;  Laterality: Left;   AMPUTATION Left 11/04/2022   Procedure: AMPUTATION ABOVE KNEE;  Surgeon: Celso College, MD;  Location: [facility];  Service: Vascular;  Laterality: Left;   APPLICATION OF WOUND VAC  10/16/2021   Procedure: APPLICATION OF WOUND VAC;  Surgeon: Celso College, MD;  Location: [facility];  Service: Vascular;;   CATARACT EXTRACTION W/PHACO Right 03/27/2020   Procedure: CATARACT EXTRACTION PHACO AND INTRAOCULAR LENS PLACEMENT (IOC) RIGHT DIABETIC 7.54 00:52.5;  Surgeon: Clair Crews, MD;  Location: [facility];  Service: Ophthalmology;  Laterality: Right;   CATARACT EXTRACTION W/PHACO Left 05/20/2020   Procedure: CATARACT EXTRACTION PHACO AND INTRAOCULAR LENS PLACEMENT (IOC) LEFT DIABETIC 4.95 00:37.6;  Surgeon: Clair Crews, MD;  Location: [facility];  Service: Ophthalmology;  Laterality: Left;  Diabetic - oral meds COVID + 04-24-20    CHOLECYSTECTOMY     COLONOSCOPY WITH PROPOFOL  N/A 04/16/2019   Procedure: COLONOSCOPY WITH PROPOFOL ;  Surgeon: Irby Mannan, MD;  Location: [facility];  Service: Endoscopy;  Laterality: N/A;   COLONOSCOPY WITH PROPOFOL  N/A 01/16/2020   Procedure: COLONOSCOPY WITH PROPOFOL ;  Surgeon: Irby Mannan, MD;  Location: [facility];  Service: Endoscopy;  Laterality: N/A;   ENDARTERECTOMY FEMORAL Left 10/16/2021   Procedure: ENDARTERECTOMY FEMORAL;  Surgeon: Celso College, MD;  Location: [facility];  Service: Vascular;  Laterality: Left;   ESOPHAGOGASTRODUODENOSCOPY (EGD) WITH PROPOFOL  N/A 06/15/2018   Procedure: ESOPHAGOGASTRODUODENOSCOPY (EGD) WITH PROPOFOL ;  Surgeon: Tol[place], Alphonsus Jeans, MD;  Location: [facility];  Service: Gastroenterology;  Laterality: N/A;   ESOPHAGOGASTRODUODENOSCOPY (EGD) WITH PROPOFOL  N/A 01/03/2019   Procedure: ESOPHAGOGASTRODUODENOSCOPY (EGD) WITH PROPOFOL ;  Surgeon: Marnee Sink, MD;  Location: [facility];  Service: Endoscopy;  Laterality: N/A;   ESOPHAGOGASTRODUODENOSCOPY (EGD) WITH PROPOFOL  N/A 09/25/2021   Procedure: ESOPHAGOGASTRODUODENOSCOPY (EGD) WITH PROPOFOL ;  Surgeon: Marnee Sink, MD;  Location: [facility];  Service: Endoscopy;  Laterality: N/A;   EYE SURGERY Bilateral    GIVENS CAPSULE STUDY  04/16/2019   Procedure: GIVENS CAPSULE STUDY;  Surgeon: Irby Mannan, MD;  Location: [facility];  Service: Endoscopy;;   GIVENS CAPSULE STUDY N/A 06/25/2019   Procedure: GIVENS CAPSULE STUDY;  Surgeon: Luke Salaam, MD;  Location: [facility];  Service: Gastroenterology;  Laterality: N/A;   INCISION AND DRAINAGE OF WOUND Left 02/24/2022   Procedure: LEFT BKA IRRIGATION  AND DEBRIDEMENT WOUND;  Surgeon: Celso College, MD;  Location: [facility];  Service: Vascular;  Laterality: Left;   LOWER EXTREMITY ANGIOGRAPHY Right 08/21/2018   Procedure: LOWER EXTREMITY ANGIOGRAPHY;  Surgeon: Celso College, MD;  Location: [facility];  Service:  Cardiovascular;  Laterality: Right;   LOWER EXTREMITY ANGIOGRAPHY Left 08/28/2018   Procedure: LOWER EXTREMITY ANGIOGRAPHY;  Surgeon: Celso College, MD;  Location: [facility];  Service: Cardiovascular;  Laterality: Left;   LOWER EXTREMITY ANGIOGRAPHY Right 08/28/2018   Procedure: Lower Extremity Angiography;  Surgeon: Celso College, MD;  Location: [facility];  Service: Cardiovascular;  Laterality: Right;   LOWER EXTREMITY ANGIOGRAPHY Right 12/18/2018   Procedure: Lower Extremity Angiography;  Surgeon: Celso College, MD;  Location: [facility];  Service: Cardiovascular;  Laterality: Right;   LOWER EXTREMITY ANGIOGRAPHY Right 12/18/2018   Procedure: Lower Extremity Angiography;  Surgeon: Celso College, MD;  Location: [facility];  Service: Cardiovascular;  Laterality: Right;   LOWER EXTREMITY ANGIOGRAPHY Left 12/21/2018   Procedure: Lower Extremity Angiography;  Surgeon: Celso College, MD;  Location: [facility];  Service: Cardiovascular;  Laterality: Left;   LOWER EXTREMITY ANGIOGRAPHY Right 12/21/2018   Procedure: Lower Extremity Angiography;  Surgeon: Celso College, MD;  Location: [facility];  Service: Cardiovascular;  Laterality: Right;   LOWER EXTREMITY ANGIOGRAPHY Left 12/25/2020   Procedure: LOWER EXTREMITY ANGIOGRAPHY;  Surgeon: Celso College, MD;  Location: [facility];  Service: Cardiovascular;  Laterality: Left;   LOWER EXTREMITY ANGIOGRAPHY Left 09/21/2021   Procedure: Lower Extremity Angiography;  Surgeon: Celso College, MD;  Location: [facility];  Service: Cardiovascular;  Laterality: Left;   LOWER EXTREMITY ANGIOGRAPHY Left 10/13/2021   Procedure: Lower Extremity Angiography;  Surgeon: Jackquelyn Mass, MD;  Location: [facility];  Service: Cardiovascular;  Laterality: Left;   LOWER EXTREMITY ANGIOGRAPHY Left 10/14/2021   Procedure: Lower Extremity Angiography;  Surgeon: Celso College, MD;  Location: [facility];  Service: Cardiovascular;  Laterality: Left;   LOWER EXTREMITY ANGIOGRAPHY Left 11/02/2021   Procedure: Lower Extremity Angiography;  Surgeon: Celso College, MD;  Location: [facility];  Service: Cardiovascular;  Laterality: Left;   LOWER EXTREMITY INTERVENTION N/A 12/22/2018   Procedure: LOWER EXTREMITY INTERVENTION;  Surgeon: Celso College, MD;  Location: [facility];  Service: Cardiovascular;  Laterality: N/A;   LOWER EXTREMITY INTERVENTION Left 12/26/2020   Procedure: LOWER EXTREMITY INTERVENTION;  Surgeon: Celso College, MD;  Location: [facility];  Service: Cardiovascular;  Laterality: Left;   LOWER EXTREMITY INTERVENTION N/A 09/22/2021   Procedure: LOWER EXTREMITY INTERVENTION;  Surgeon: Celso College, MD;  Location: [facility];  Service: Cardiovascular;  Laterality: N/A;   Patient Active Problem List   Diagnosis Date Noted   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) 11/04/2022   Amputation above knee (HCC) 11/04/2022   Cellulitis of left lower extremity 02/23/2022   Sepsis due to cellulitis (HCC) 02/23/2022   Dyslipidemia 02/23/2022   Rheumatoid arthritis (HCC) 02/23/2022   Essential hypertension 02/23/2022   Type 2 diabetes mellitus with peripheral neuropathy (HCC) 02/23/2022   Multiple duodenal ulcers    Ischemic leg 12/25/2020   Rheumatoid arthritis of 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) 08/13/2020   Diabetes mellitus without complication (HCC)    GERD (gastroesophageal reflux disease)    COPD (  chronic obstructive pulmonary disease) (HCC)    GIB (gastrointestinal bleeding)    HLD (hyperlipidemia)    Depression    Elevated troponin    Syncope    Elevated troponin I level    Anemia of chronic disease 05/16/2019   Dizziness    Polyp of colon    Acute upper GI bleeding 04/13/2019   Anemia associated with acute blood loss 04/13/2019   Chronic anticoagulation 04/13/2019   Hx of right BKA  (HCC) 04/13/2019   Symptomatic anemia 04/13/2019   Upper GI bleed 04/13/2019   Acute GI bleeding 04/13/2019   Phantom pain after amputation of lower extremity (HCC) 01/30/2019   Melena    DU (duodenal ulcer)    DKA (diabetic ketoacidosis) (HCC) 12/14/2018   Atherosclerosis of artery of extremity with rest pain (HCC) 08/28/2018   PAD (peripheral artery disease) (HCC) 07/14/2018   Abdominal pain 06/13/2018   HTN (hypertension) 06/13/2018   Non-insulin  dependent type 2 diabetes mellitus (HCC) 06/13/2018   Elevated lactic acid level 06/13/2018    PCP: [facility]  REFERRING PROVIDER: Valene Gash, NP  REFERRING DIAG:  [phone] (ICD-10-CM) - Hx of right BKA (HCC)  Z89.512 (ICD-10-CM) - Hx of BKA, left (HCC)    THERAPY DIAG:  Difficulty in walking, not elsewhere classified  Muscle weakness (generalized)  Imbalance  History of amputation below knee, right (HCC)  History of above knee amputation, left (HCC)  Rationale for Evaluation and Treatment: Rehabilitation  ONSET DATE: L AKA 11/04/22   PERTINENT HISTORY: Pt is a 68 year old female s/p L AKA 11/04/22. Pt has received above-knee prosthesis for L. Pt had R BKA in Sept 2020 with prosthetic rehab in this clinic. Pt is proficient with R below-knee prosthesis.  Patient reports standing with her prosthesis at home with her sister and walking with it some at home. Pt reports dealing with phantom limb pain. Patient reports getting this in R BKA and L AKA limbs. Patient reports Gabapentin  helps to control phantom limb symptoms. Pt had R BKA 4-5 years ago. Pt reports some tenderness along end of L residual limb. Pt reports that she has not used her prosthesis much over previous 2 weeks. She reports using shrinker each day and completing regular checks on skin integrity for L residual limb.  Type of amputation: Above-knee amputation  Onset: L AKA 11/04/22 Follow-up appt with MD: Pt following up with Fallon E  Brown for check-up between May and June Pain: 710 Present, 10/10 Worst: (Pain along L hip flexor/L groin) Phantom pain: Yes History of previous amputations/PAD: Yes; Hx of R BKA  Pain quality: pain quality: sharp Numbness/Tingling: Yes Imaging: No    WEIGHT BEARING RESTRICTIONS: No  FALLS:  Has patient fallen in last 6 months? No  LIVING ENVIRONMENT: Lives with:  sister, great granddaughter 60 y/o, significant other  Lives in: House/apartment; 3 steps in front to get into home; temporary living until pt finds home with flat entryway;  Shower chair in bathroom; pt able to get in/out of bathroom Stairs:  stairs into basement only, pt does not go there often Has following equipment at home: Otho Blitz - 2 wheeled, Wheelchair (power), Wheelchair (manual), shower chair, and Grab bars; sliding board for W/C transfers  PLOF: Independent with household mobility with device  PATIENT GOALS: Get back to walking and improved independence with L AKA prosthesis  NEXT MD VISIT: Between May-June   OBJECTIVE:  Note: Objective measures were completed at Evaluation unless otherwise noted.  MUSCULOSKELETAL: Tremor:  Absent Tone: Normal, no spasticity, rigidity, or clonus Residual limb appears well-healing with no signs of erythema, skin breakdown, infection, or drainage  Posture Forward flexed posture in static standing  Palpation No pain to palpation along distal surface of residual limb.  No pain with palpation to quadriceps or hamstrings.    PATIENT SURVEYS:  ABC scale 6.3%  COGNITION: Overall cognitive status: Within functional limits for tasks assessed     SENSATION: Not tested   LOWER EXTREMITY ROM:  Active ROM Right eval Left eval  Hip flexion WNL WNL  Hip extension WNL -5  Hip abduction 40 40  Hip adduction    Hip internal rotation    Hip external rotation    Knee flexion    Knee extension    Ankle dorsiflexion    Ankle plantarflexion    Ankle inversion    Ankle  eversion     (Blank rows = not tested)  LOWER EXTREMITY MMT:  MMT Right eval Left eval  Hip flexion 4+ 4  Hip extension 4 4  Hip abduction 4+ 4+  Hip adduction    Hip internal rotation    Hip external rotation    Knee flexion 4+   Knee extension 4+   Ankle dorsiflexion    Ankle plantarflexion    Ankle inversion    Ankle eversion     (Blank rows = not tested)   FUNCTIONAL TASKS:  W/C to bed transfer: CGA with sit to stand, pt presents with heavy forward lean and weight shift toward RLE, heavy upper limb push on FWW once pt has hands on walker. Heavy verbal cueing to push from seat versus walker for transferring.  Sit to stand: Difficulty initiating with slowed velocity of first 25% of transfer following lift from mat.   GAIT: Distance walked: 60 ft Assistive device utilized: Walker - 2 wheeled Level of assistance: CGA Comments: Heavy upper limb support on walker. Step-to pattern with RLE leading. Decreased terminal knee extension of L prosthesis at initial contact that can be partially corrected with verbal cueing for swing technique. Decreased LLE weight shift during stance phase. Decreased hip extension during mid-stance to terminal stance.                                                                                                                                 TREATMENT DATE: 08/22/2023     SUBJECTIVE STATEMENT:   Patient reports no further updates at arrival this AM. She reports not wearing her prosthesis at home over this past week - only donning it for PT. She is compliant with exercises in bed to work on hip strengthening and hip extension mobility. Pt has not yet swapped out rubber stoppers on back of FWW for sliders.     Therapeutic Activities - patient education, repetitive task practice for improved performance of daily functional activities e.g. transferring, weight transfer practice to improve static standing activities and weight shift during  ambulation   Ambulate around gym with front-wheeled walker, with sliders on rear legs; 2 laps  -PT SBA Ambulate with rollator; 3 laps around gym  -Pt demonstration for technique, and verbal/tactile cueing for use of handbrakes for transferring and preparing to ambulate  -PT CGA  Forward gait in bars with unilateral R upper limb support, step-to pattern with LLE first; x 3 D/B length of bars  -bilat UE support for 180-degree turn at end of bars  Alternating toe tap, 6-inch step, with bilateral upper limb support; 2x10 alt Alternating toe tap, 6-inch step, with unilateral R upper limb support; 2x10 alt  Gait from breezeway to side entrance of PT gym; 1 stop on chair set along sidewalk due to notable fatigue  -PT CGA  -Verbal cueing for prosthesis placement and ensuring adequate L AKA prosthetic knee extension for stability     PATIENT EDUCATION: Discussed current progress and POC.   *next visit* Functional reach with no UE support (intermittent contact with bar if losing balance); 8 cones - stacking and unstacking, placing on adjacent raised table; x 5 minutes  *not today* Forward gait in bars with bilateral UE support and practicing step-through versus step-to gait  -PT SBA, cueing for LLE swing  Forward step-up; RLE first with LLE trailing; x 8 reps Sit to stand with chair + Airex; task practice to reduce upper limb assistance and controlled descent; x 5 reps Weight shifting with bilateral symmetrical stance and no UE support alternating R/L; x 60 sec   PATIENT EDUCATION:  Education details: see above for patient education details Person educated: Patient Education method: Explanation, Demonstration, and Handouts Education comprehension: verbalized understanding and returned demonstration  HOME EXERCISE PROGRAM: Access Code: [email or web address] URL: [email or web address] Date: 06/23/2023 Prepared by: Denese Finn  Exercises - Prone Hip Flexor Stretch with Towel  Roll (AKA)  - 2-3 x daily - 7 x weekly - 1 sets - 2-3 min hold - Supine Hip Abduction  - 2 x daily - 7 x weekly - 2 sets - 10 reps - Sidelying Hip Abduction (AKA)  - 2 x daily - 7 x weekly - 2 sets - 10 reps - Prone Hip Extension with Residual Limb (AKA)  - 2 x daily - 7 x weekly - 2 sets - 10 reps  ASSESSMENT:  CLINICAL IMPRESSION: We are continuing to work on gait with AD that allows for more mobility and more-continuous gait pattern. Pt exhibits safe pattern with use of rollator and FWW with gliders on rear legs versus rubber stoppers. Pt does not have notable LOB with outdoor ambulation, but she does have significant fatigue from increased energy expenditure required with bilateral prostheses - pt also needs more wear time of prostheses outside of clinic to acclimate to contact pressure on residual limbs and improve gait with more-frequent practice. Patient presents with prosthetic gait deviations, hip weakness, imbalance, and difficulty with weight shifting/transfers. Pt will continue to benefit from skilled PT services to address deficits and improve function.  OBJECTIVE IMPAIRMENTS: Abnormal gait, decreased balance, decreased mobility, difficulty walking, decreased strength, prosthetic dependency , and pain.   ACTIVITY LIMITATIONS: carrying, standing, stairs, transfers, bed mobility, and locomotion level  PARTICIPATION LIMITATIONS: cleaning, laundry, shopping, and community activity  PERSONAL FACTORS: Age, Past/current experiences, and 3+ comorbidities: (anxiety, depression, CKD, COPD, DM, DKA, HTN lower extremity ischemia)  are also affecting patient's functional outcome.   REHAB POTENTIAL: Good  CLINICAL DECISION MAKING: Evolving/moderate complexity  EVALUATION COMPLEXITY: Moderate   GOALS: Goals reviewed with patient? Yes  SHORT TERM GOALS: Target date: 07/14/23 Patient will be independent and 100% compliant with HEP as needed for carryover of PT intervention Baseline: 06/21/23:  Baseline HEP reviewed.  08/04/23: largely compliant with HEP.  Goal status: IN PROGRESS/MOSTLY MET  2.  Patient will attain L hip hyperextension to 15 deg indicative of improved ROM required for terminal stance/heel off during gait Baseline: 06/21/23: L hip extension -5.   08/04/23: 5 deg hyperextension Goal status: IN PROGRESS  3.  Patient will demonstrate modified-independent sit to stand and stand-pivot transfer from W/C to adjacent sitting surface with no LOB and no verbal cueing required from therapist for technique Baseline: 06/21/23: Difficulty and some cueing needed for safety with transferring and stand-pivot transfer.   08/04/23: completed with supervision level of assist  Goal status: IN PROGRESS/MOSTLY MET   LONG TERM GOALS: Target date: 09/01/23  Patient will ambulate at community-level distance with front-wheeled walker ModI with no LOB and normalized hip extension and swing through for L above-knee prosthesis Baseline: 06/21/23: Noted gait deviations and pt ambulating short of household distance with L AKA prosthesis.   08/04/23: Ambulates for home distance with CGA. Limited capacity for community-level distance walking with FWW.  Goal status: IN PROGRESS  2.  Pt will improve ABC Scale by 13% or greater indicative of improved confidence related to balance and fall risk and clinically meaningful change Baseline: 06/21/23: ABC Scale to be obtained on visit # 2.   06/27/23: 6.3%  08/04/23: 8.8% Goal status: NOT MET  3.  Pt will perform TUG in less than 20 seconds indicative of decreased risk of falls relative to patient-specific population Baseline: 06/21/23: TUG to be completed on visit # 2.   06/27/23: 136 sec.  08/04/23: Deferred to next visit.    08/09/23: 59.1 sec.  Goal status: IN PROGRESS  4.  Pt will improve Amputee Mobility Predictor by 4 points or greater indicative of detectable change in ADLs and functional mobility with prostheses.  Baseline: 06/21/23: AMPPRO to be completed on visits  #2-3.  07/06/23: 23/47 08/04/23:  22/47. Goal status: NOT MET   5.  Patient will negotiate 3 steps ModI with FWW as needed for entering/exiting her home. Baseline: 06/21/23: Significant difficulty with stair negotiation.  08/04/23: Completed today with ModA x 2 with heavy upper limb assist.  Goal status: IN PROGRESS    PLAN:  PT FREQUENCY: 2x/week  PT DURATION: 10 weeks  PLANNED INTERVENTIONS: 97164- PT Re-evaluation, 97110-Therapeutic exercises, 97530- Therapeutic activity, [id number]- Neuromuscular re-education, 97535- Self Care, 16109- Prosthetic training, Patient/Family education, and Balance training  PLAN FOR NEXT SESSION: Continue with weight shifting and gait training with new L AKA prosthesis, correcting gait deviations. Progress with lessening upper limb support as able to negotiating various obstacles/terrain with bilat prostheses. Open-chain strengthening for BLE.    Denese Finn, PT, DPT #[id number]  Aleatha Hunting, PT 08/22/2023, 5:03 PM

## 2023-08-23 ENCOUNTER — Ambulatory Visit: Payer: 59 | Admitting: Physical Therapy

## 2023-08-23 DIAGNOSIS — Z89612 Acquired absence of left leg above knee: Secondary | ICD-10-CM

## 2023-08-23 DIAGNOSIS — Z89511 Acquired absence of right leg below knee: Secondary | ICD-10-CM

## 2023-08-23 DIAGNOSIS — R2689 Other abnormalities of gait and mobility: Secondary | ICD-10-CM

## 2023-08-23 DIAGNOSIS — M6281 Muscle weakness (generalized): Secondary | ICD-10-CM

## 2023-08-23 DIAGNOSIS — R262 Difficulty in walking, not elsewhere classified: Secondary | ICD-10-CM

## 2023-08-25 ENCOUNTER — Ambulatory Visit: Payer: 59 | Admitting: Physical Therapy

## 2023-08-25 VITALS — BP 162/65 | HR 86

## 2023-08-25 DIAGNOSIS — Z89612 Acquired absence of left leg above knee: Secondary | ICD-10-CM

## 2023-08-25 DIAGNOSIS — Z89511 Acquired absence of right leg below knee: Secondary | ICD-10-CM

## 2023-08-25 DIAGNOSIS — R2689 Other abnormalities of gait and mobility: Secondary | ICD-10-CM

## 2023-08-25 DIAGNOSIS — R262 Difficulty in walking, not elsewhere classified: Secondary | ICD-10-CM | POA: Diagnosis not present

## 2023-08-25 DIAGNOSIS — M6281 Muscle weakness (generalized): Secondary | ICD-10-CM

## 2023-08-25 NOTE — Therapy (Signed)
 OUTPATIENT PHYSICAL THERAPY TREATMENT    Patient Name: Sylvia [name] MRN: [id number] DOB:04/07/1956, 68 y.o., female Today's Date: 08/25/2023   END OF SESSION:  PT End of Session - 08/25/23 0920     Visit Number 14    Number of Visits 21    Date for PT Re-Evaluation 09/01/23    Authorization Type [employer]    PT Start Time 0915    PT Stop Time 0958    PT Time Calculation (min) 43 min    Equipment Utilized During Treatment Gait belt   patient's personal FWW   Activity Tolerance Patient limited by fatigue;Patient tolerated treatment well   c/o bilat phantom limb pain   Behavior During Therapy WFL for tasks assessed/performed                 Past Medical History:  Diagnosis Date   Anemia of chronic disease 05/16/2019   Anxiety    h/o   Arthritis    Chronic kidney disease    Cocaine abuse (HCC)    COPD (chronic obstructive pulmonary disease) (HCC)    Coronary artery disease    Depression    Diabetes mellitus without complication (HCC)    DKA (diabetic ketoacidosis) (HCC)    Elevated troponin    GERD (gastroesophageal reflux disease)    GI bleed    Hypertension    bp under control-off meds since 2019   Ischemic leg    Past Surgical History:  Procedure Laterality Date   ABDOMINAL HYSTERECTOMY     AMPUTATION Right 12/27/2018   Procedure: AMPUTATION BELOW KNEE;  Surgeon: Celso College, MD;  Location: [facility];  Service: General;  Laterality: Right;   AMPUTATION Left 11/05/2021   Procedure: AMPUTATION BELOW KNEE;  Surgeon: Celso College, MD;  Location: [facility];  Service: Vascular;  Laterality: Left;   AMPUTATION Left 11/04/2022   Procedure: AMPUTATION ABOVE KNEE;  Surgeon: Celso College, MD;  Location: [facility];  Service: Vascular;  Laterality: Left;   APPLICATION OF WOUND VAC  10/16/2021   Procedure: APPLICATION OF WOUND VAC;  Surgeon: Celso College, MD;  Location: [facility];  Service: Vascular;;   CATARACT EXTRACTION W/PHACO Right 03/27/2020   Procedure:  CATARACT EXTRACTION PHACO AND INTRAOCULAR LENS PLACEMENT (IOC) RIGHT DIABETIC 7.54 00:52.5;  Surgeon: Clair Crews, MD;  Location: [facility];  Service: Ophthalmology;  Laterality: Right;   CATARACT EXTRACTION W/PHACO Left 05/20/2020   Procedure: CATARACT EXTRACTION PHACO AND INTRAOCULAR LENS PLACEMENT (IOC) LEFT DIABETIC 4.95 00:37.6;  Surgeon: Clair Crews, MD;  Location: [facility];  Service: Ophthalmology;  Laterality: Left;  Diabetic - oral meds COVID + 04-24-20   CHOLECYSTECTOMY     COLONOSCOPY WITH PROPOFOL  N/A 04/16/2019   Procedure: COLONOSCOPY WITH PROPOFOL ;  Surgeon: Irby Mannan, MD;  Location: [facility];  Service: Endoscopy;  Laterality: N/A;   COLONOSCOPY WITH PROPOFOL  N/A 01/16/2020   Procedure: COLONOSCOPY WITH PROPOFOL ;  Surgeon: Irby Mannan, MD;  Location: [facility];  Service: Endoscopy;  Laterality: N/A;   ENDARTERECTOMY FEMORAL Left 10/16/2021   Procedure: ENDARTERECTOMY FEMORAL;  Surgeon: Celso College, MD;  Location: [facility];  Service: Vascular;  Laterality: Left;   ESOPHAGOGASTRODUODENOSCOPY (EGD) WITH PROPOFOL  N/A 06/15/2018   Procedure: ESOPHAGOGASTRODUODENOSCOPY (EGD) WITH PROPOFOL ;  Surgeon: Toledo, Alphonsus Jeans, MD;  Location: [facility];  Service: Gastroenterology;  Laterality: N/A;   ESOPHAGOGASTRODUODENOSCOPY (EGD) WITH PROPOFOL  N/A 01/03/2019   Procedure: ESOPHAGOGASTRODUODENOSCOPY (EGD) WITH PROPOFOL ;  Surgeon: Marnee Sink, MD;  Location: [facility];  Service: Endoscopy;  Laterality: N/A;   ESOPHAGOGASTRODUODENOSCOPY (EGD) WITH PROPOFOL  N/A 09/25/2021   Procedure: ESOPHAGOGASTRODUODENOSCOPY (EGD) WITH PROPOFOL ;  Surgeon: Marnee Sink, MD;  Location: [facility];  Service: Endoscopy;  Laterality: N/A;   EYE SURGERY Bilateral    GIVENS CAPSULE STUDY  04/16/2019   Procedure: GIVENS CAPSULE STUDY;  Surgeon: Irby Mannan, MD;  Location: [facility];  Service: Endoscopy;;   GIVENS CAPSULE STUDY N/A  06/25/2019   Procedure: GIVENS CAPSULE STUDY;  Surgeon: Luke Salaam, MD;  Location: [facility];  Service: Gastroenterology;  Laterality: N/A;   INCISION AND DRAINAGE OF WOUND Left 02/24/2022   Procedure: LEFT BKA IRRIGATION AND DEBRIDEMENT WOUND;  Surgeon: Celso College, MD;  Location: [facility];  Service: Vascular;  Laterality: Left;   LOWER EXTREMITY ANGIOGRAPHY Right 08/21/2018   Procedure: LOWER EXTREMITY ANGIOGRAPHY;  Surgeon: Celso College, MD;  Location: [facility];  Service: Cardiovascular;  Laterality: Right;   LOWER EXTREMITY ANGIOGRAPHY Left 08/28/2018   Procedure: LOWER EXTREMITY ANGIOGRAPHY;  Surgeon: Celso College, MD;  Location: [facility];  Service: Cardiovascular;  Laterality: Left;   LOWER EXTREMITY ANGIOGRAPHY Right 08/28/2018   Procedure: Lower Extremity Angiography;  Surgeon: Celso College, MD;  Location: [facility];  Service: Cardiovascular;  Laterality: Right;   LOWER EXTREMITY ANGIOGRAPHY Right 12/18/2018   Procedure: Lower Extremity Angiography;  Surgeon: Celso College, MD;  Location: [facility];  Service: Cardiovascular;  Laterality: Right;   LOWER EXTREMITY ANGIOGRAPHY Right 12/18/2018   Procedure: Lower Extremity Angiography;  Surgeon: Celso College, MD;  Location: [facility];  Service: Cardiovascular;  Laterality: Right;   LOWER EXTREMITY ANGIOGRAPHY Left 12/21/2018   Procedure: Lower Extremity Angiography;  Surgeon: Celso College, MD;  Location: [facility];  Service: Cardiovascular;  Laterality: Left;   LOWER EXTREMITY ANGIOGRAPHY Right 12/21/2018   Procedure: Lower Extremity Angiography;  Surgeon: Celso College, MD;  Location: [facility];  Service: Cardiovascular;  Laterality: Right;   LOWER EXTREMITY ANGIOGRAPHY Left 12/25/2020   Procedure: LOWER EXTREMITY ANGIOGRAPHY;  Surgeon: Celso College, MD;  Location: [facility];  Service: Cardiovascular;  Laterality: Left;   LOWER EXTREMITY  ANGIOGRAPHY Left 09/21/2021   Procedure: Lower Extremity Angiography;  Surgeon: Celso College, MD;  Location: [facility];  Service: Cardiovascular;  Laterality: Left;   LOWER EXTREMITY ANGIOGRAPHY Left 10/13/2021   Procedure: Lower Extremity Angiography;  Surgeon: Jackquelyn Mass, MD;  Location: [facility];  Service: Cardiovascular;  Laterality: Left;   LOWER EXTREMITY ANGIOGRAPHY Left 10/14/2021   Procedure: Lower Extremity Angiography;  Surgeon: Celso College, MD;  Location: [facility];  Service: Cardiovascular;  Laterality: Left;   LOWER EXTREMITY ANGIOGRAPHY Left 11/02/2021   Procedure: Lower Extremity Angiography;  Surgeon: Celso College, MD;  Location: [facility];  Service: Cardiovascular;  Laterality: Left;   LOWER EXTREMITY INTERVENTION N/A 12/22/2018   Procedure: LOWER EXTREMITY INTERVENTION;  Surgeon: Celso College, MD;  Location: [facility];  Service: Cardiovascular;  Laterality: N/A;   LOWER EXTREMITY INTERVENTION Left 12/26/2020   Procedure: LOWER EXTREMITY INTERVENTION;  Surgeon: Celso College, MD;  Location: [facility];  Service: Cardiovascular;  Laterality: Left;   LOWER EXTREMITY INTERVENTION N/A 09/22/2021   Procedure: LOWER EXTREMITY INTERVENTION;  Surgeon: Celso College, MD;  Location: [facility];  Service: Cardiovascular;  Laterality: N/A;   Patient Active  Problem List   Diagnosis Date Noted   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) 11/04/2022   Amputation above knee (HCC) 11/04/2022   Cellulitis of left lower extremity 02/23/2022   Sepsis due to cellulitis (HCC) 02/23/2022   Dyslipidemia 02/23/2022   Rheumatoid arthritis (HCC) 02/23/2022   Essential hypertension 02/23/2022   Type 2 diabetes mellitus with peripheral neuropathy (HCC) 02/23/2022   Multiple duodenal ulcers    Ischemic leg 12/25/2020   Rheumatoid arthritis of 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)  08/13/2020   Diabetes mellitus without complication (HCC)    GERD (gastroesophageal reflux disease)    COPD (chronic obstructive pulmonary disease) (HCC)    GIB (gastrointestinal bleeding)    HLD (hyperlipidemia)    Depression    Elevated troponin    Syncope    Elevated troponin I level    Anemia of chronic disease 05/16/2019   Dizziness    Polyp of colon    Acute upper GI bleeding 04/13/2019   Anemia associated with acute blood loss 04/13/2019   Chronic anticoagulation 04/13/2019   Hx of right BKA (HCC) 04/13/2019   Symptomatic anemia 04/13/2019   Upper GI bleed 04/13/2019   Acute GI bleeding 04/13/2019   Phantom pain after amputation of lower extremity (HCC) 01/30/2019   Melena    DU (duodenal ulcer)    DKA (diabetic ketoacidosis) (HCC) 12/14/2018   Atherosclerosis of artery of extremity with rest pain (HCC) 08/28/2018   PAD (peripheral artery disease) (HCC) 07/14/2018   Abdominal pain 06/13/2018   HTN (hypertension) 06/13/2018   Non-insulin  dependent type 2 diabetes mellitus (HCC) 06/13/2018   Elevated lactic acid level 06/13/2018    PCP: [facility]  REFERRING PROVIDER: Valene Gash, NP  REFERRING DIAG:  [phone] (ICD-10-CM) - Hx of right BKA (HCC)  Z89.512 (ICD-10-CM) - Hx of BKA, left (HCC)    THERAPY DIAG:  Difficulty in walking, not elsewhere classified  Muscle weakness (generalized)  Imbalance  History of amputation below knee, right (HCC)  History of above knee amputation, left (HCC)  Rationale for Evaluation and Treatment: Rehabilitation  ONSET DATE: L AKA 11/04/22   PERTINENT HISTORY: Pt is a 68 year old female s/p L AKA 11/04/22. Pt has received above-knee prosthesis for L. Pt had R BKA in Sept 2020 with prosthetic rehab in this clinic. Pt is proficient with R below-knee prosthesis.  Patient reports standing with her prosthesis at home with her sister and walking with it some at home. Pt reports dealing with phantom limb  pain. Patient reports getting this in R BKA and L AKA limbs. Patient reports Gabapentin  helps to control phantom limb symptoms. Pt had R BKA 4-5 years ago. Pt reports some tenderness along end of L residual limb. Pt reports that she has not used her prosthesis much over previous 2 weeks. She reports using shrinker each day and completing regular checks on skin integrity for L residual limb.  Type of amputation: Above-knee amputation  Onset: L AKA 11/04/22 Follow-up appt with MD: Pt following up with [facility] for check-up between May and June Pain: 710 Present, 10/10 Worst: (Pain along L hip flexor/L groin) Phantom pain: Yes History of previous amputations/PAD: Yes; Hx of R BKA  Pain quality: pain quality: sharp Numbness/Tingling: Yes Imaging: No    WEIGHT BEARING RESTRICTIONS: No  FALLS:  Has patient fallen in last 6 months? No  LIVING ENVIRONMENT: Lives with: sister, great granddaughter 3 y/o, significant other  Lives in: House/apartment;  3 steps in front to get into home; temporary living until pt finds home with flat entryway;  Shower chair in bathroom; pt able to get in/out of bathroom Stairs: stairs into basement only, pt does not go there often Has following equipment at home: Otho Blitz - 2 wheeled, Wheelchair (power), Wheelchair (manual), shower chair, and Grab bars; sliding board for W/C transfers  PLOF: Independent with household mobility with device  PATIENT GOALS: Get back to walking and improved independence with L AKA prosthesis  NEXT MD VISIT: Between May-June   OBJECTIVE:  Note: Objective measures were completed at Evaluation unless otherwise noted.  MUSCULOSKELETAL: Tremor: Absent Tone: Normal, no spasticity, rigidity, or clonus Residual limb appears well-healing with no signs of erythema, skin breakdown, infection, or drainage  Posture Forward flexed posture in static standing  Palpation No pain to palpation along distal surface of residual limb.  No  pain with palpation to quadriceps or hamstrings.    PATIENT SURVEYS:  ABC scale 6.3%  COGNITION: Overall cognitive status: Within functional limits for tasks assessed     SENSATION: Not tested   LOWER EXTREMITY ROM:  Active ROM Right eval Left eval  Hip flexion WNL WNL  Hip extension WNL -5  Hip abduction 40 40  Hip adduction    Hip internal rotation    Hip external rotation    Knee flexion    Knee extension    Ankle dorsiflexion    Ankle plantarflexion    Ankle inversion    Ankle eversion     (Blank rows = not tested)  LOWER EXTREMITY MMT:  MMT Right eval Left eval  Hip flexion 4+ 4  Hip extension 4 4  Hip abduction 4+ 4+  Hip adduction    Hip internal rotation    Hip external rotation    Knee flexion 4+   Knee extension 4+   Ankle dorsiflexion    Ankle plantarflexion    Ankle inversion    Ankle eversion     (Blank rows = not tested)   FUNCTIONAL TASKS:  W/C to bed transfer: CGA with sit to stand, pt presents with heavy forward lean and weight shift toward RLE, heavy upper limb push on FWW once pt has hands on walker. Heavy verbal cueing to push from seat versus walker for transferring.  Sit to stand: Difficulty initiating with slowed velocity of first 25% of transfer following lift from mat.   GAIT: Distance walked: 60 ft Assistive device utilized: Walker - 2 wheeled Level of assistance: CGA Comments: Heavy upper limb support on walker. Step-to pattern with RLE leading. Decreased terminal knee extension of L prosthesis at initial contact that can be partially corrected with verbal cueing for swing technique. Decreased LLE weight shift during stance phase. Decreased hip extension during mid-stance to terminal stance.  TREATMENT DATE: 08/25/2023     SUBJECTIVE STATEMENT:   Patient reports increasing her prostheses wear time  at home up to 1 hour in AM and PM. She reports notable L hip soreness following HEP the previous day - feeling muscle soreness in L gluteal region this AM.     Therapeutic Activities - patient education, repetitive task practice for improved performance of daily functional activities e.g. transferring, weight transfer practice to improve static standing activities and weight shift during ambulation    Forward gait in bars with bilateral UE support and practicing step-through versus step-to gait; x 3 D/B  -PT SBA, cueing for LLE swing Forward gait in bars with unilateral R upper limb support, step-to pattern with LLE first; x 3 D/B length of bars  -bilat UE support for 180-degree turn at end of bars  Functional reach with no UE support (intermittent contact with bar if losing balance); 8 cones - stacking and unstacking, placing on adjacent raised table; x 5 minutes  Alternating toe tap, 6-inch step + airex, with bilateral upper limb support; 2x10 alt  Gait from side entrance of PT gym to front breezeway to front entrance of PT gym; no stop needed today  -PT CGA  -Verbal cueing for prosthesis placement and ensuring adequate L AKA prosthetic knee extension for stability  Forward step up to 6-inch step; x10 with RLE above-knee prosthesis leading  PATIENT EDUCATION: Discussed current progress and POC.   *not today* Forward step-up; RLE first with LLE trailing; x 8 reps Sit to stand with chair + Airex; task practice to reduce upper limb assistance and controlled descent; x 5 reps Weight shifting with bilateral symmetrical stance and no UE support alternating R/L; x 60 sec   PATIENT EDUCATION:  Education details: see above for patient education details Person educated: Patient Education method: Explanation, Demonstration, and Handouts Education comprehension: verbalized understanding and returned demonstration  HOME EXERCISE PROGRAM: Access Code: [email or web address] URL:  [email or web address] Date: 06/23/2023 Prepared by: Denese Finn  Exercises - Prone Hip Flexor Stretch with Towel Roll (AKA)  - 2-3 x daily - 7 x weekly - 1 sets - 2-3 min hold - Supine Hip Abduction  - 2 x daily - 7 x weekly - 2 sets - 10 reps - Sidelying Hip Abduction (AKA)  - 2 x daily - 7 x weekly - 2 sets - 10 reps - Prone Hip Extension with Residual Limb (AKA)  - 2 x daily - 7 x weekly - 2 sets - 10 reps  ASSESSMENT:  CLINICAL IMPRESSION: Pt is spending more time with bilateral prostheses at home and exhibits improving step cadence and velocity when completing gait trials today. She exhibits improved step-through pattern with RLE versus step-to. Pt has no significant LOB with unilateral UE support with gait trials in // bars. We will continue with on gait training with various terrain/grade and working toward ambulation with LRAD. Patient presents with prosthetic gait deviations, hip weakness, imbalance, and difficulty with weight shifting/transfers. Pt will continue to benefit from skilled PT services to address deficits and improve function.  OBJECTIVE IMPAIRMENTS: Abnormal gait, decreased balance, decreased mobility, difficulty walking, decreased strength, prosthetic dependency , and pain.   ACTIVITY LIMITATIONS: carrying, standing, stairs, transfers, bed mobility, and locomotion level  PARTICIPATION LIMITATIONS: cleaning, laundry, shopping, and community activity  PERSONAL FACTORS: Age, Past/current experiences, and 3+ comorbidities: (anxiety, depression, CKD, COPD, DM, DKA, HTN lower extremity ischemia) are also affecting patient's functional outcome.   REHAB POTENTIAL: Good  CLINICAL DECISION MAKING:  Evolving/moderate complexity  EVALUATION COMPLEXITY: Moderate   GOALS: Goals reviewed with patient? Yes  SHORT TERM GOALS: Target date: 07/14/23 Patient will be independent and 100% compliant with HEP as needed for carryover of PT intervention Baseline:  06/21/23: Baseline HEP reviewed.  08/04/23: largely compliant with HEP.  Goal status: IN PROGRESS/MOSTLY MET  2.  Patient will attain L hip hyperextension to 15 deg indicative of improved ROM required for terminal stance/heel off during gait Baseline: 06/21/23: L hip extension -5.   08/04/23: 5 deg hyperextension Goal status: IN PROGRESS  3.  Patient will demonstrate modified-independent sit to stand and stand-pivot transfer from W/C to adjacent sitting surface with no LOB and no verbal cueing required from therapist for technique Baseline: 06/21/23: Difficulty and some cueing needed for safety with transferring and stand-pivot transfer.   08/04/23: completed with supervision level of assist  Goal status: IN PROGRESS/MOSTLY MET   LONG TERM GOALS: Target date: 09/01/23  Patient will ambulate at community-level distance with front-wheeled walker ModI with no LOB and normalized hip extension and swing through for L above-knee prosthesis Baseline: 06/21/23: Noted gait deviations and pt ambulating short of household distance with L AKA prosthesis.   08/04/23: Ambulates for home distance with CGA. Limited capacity for community-level distance walking with FWW.  Goal status: IN PROGRESS  2.  Pt will improve ABC Scale by 13% or greater indicative of improved confidence related to balance and fall risk and clinically meaningful change Baseline: 06/21/23: ABC Scale to be obtained on visit # 2.   06/27/23: 6.3%  08/04/23: 8.8% Goal status: NOT MET  3.  Pt will perform TUG in less than 20 seconds indicative of decreased risk of falls relative to patient-specific population Baseline: 06/21/23: TUG to be completed on visit # 2.   06/27/23: 136 sec.  08/04/23: Deferred to next visit.    08/09/23: 59.1 sec.  Goal status: IN PROGRESS  4.  Pt will improve Amputee Mobility Predictor by 4 points or greater indicative of detectable change in ADLs and functional mobility with prostheses.  Baseline: 06/21/23: AMPPRO to be completed  on visits #2-3.  07/06/23: 23/47 08/04/23:  22/47. Goal status: NOT MET   5.  Patient will negotiate 3 steps ModI with FWW as needed for entering/exiting her home. Baseline: 06/21/23: Significant difficulty with stair negotiation.  08/04/23: Completed today with ModA x 2 with heavy upper limb assist.  Goal status: IN PROGRESS    PLAN:  PT FREQUENCY: 2x/week  PT DURATION: 10 weeks  PLANNED INTERVENTIONS: 97164- PT Re-evaluation, 97110-Therapeutic exercises, 97530- Therapeutic activity, [id number]- Neuromuscular re-education, 97535- Self Care, 16109- Prosthetic training, Patient/Family education, and Balance training  PLAN FOR NEXT SESSION: Continue with weight shifting and gait training with new L AKA prosthesis, correcting gait deviations. Progress with lessening upper limb support as able to negotiating various obstacles/terrain with bilat prostheses. Open-chain strengthening for BLE.    Denese Finn, PT, DPT #[id number]  Aleatha Hunting, PT 08/25/2023, 9:53 AM

## 2023-08-29 ENCOUNTER — Inpatient Hospital Stay: Payer: 59

## 2023-08-29 ENCOUNTER — Inpatient Hospital Stay: Payer: 59 | Admitting: Oncology

## 2023-08-30 ENCOUNTER — Encounter: Payer: Self-pay | Admitting: Physical Therapy

## 2023-08-30 ENCOUNTER — Ambulatory Visit: Payer: 59 | Admitting: Physical Therapy

## 2023-08-30 DIAGNOSIS — Z89612 Acquired absence of left leg above knee: Secondary | ICD-10-CM

## 2023-08-30 DIAGNOSIS — M6281 Muscle weakness (generalized): Secondary | ICD-10-CM

## 2023-08-30 DIAGNOSIS — R262 Difficulty in walking, not elsewhere classified: Secondary | ICD-10-CM | POA: Diagnosis not present

## 2023-08-30 DIAGNOSIS — Z89511 Acquired absence of right leg below knee: Secondary | ICD-10-CM

## 2023-08-30 DIAGNOSIS — R2689 Other abnormalities of gait and mobility: Secondary | ICD-10-CM

## 2023-08-30 NOTE — Therapy (Signed)
 OUTPATIENT PHYSICAL THERAPY TREATMENT    Patient Name: Sylvia [name] MRN: [id number] DOB:12/03/1955, 68 y.o., female Today's Date: 08/30/2023   END OF SESSION:  PT End of Session - 08/30/23 1008     Visit Number 15    Number of Visits 21    Date for PT Re-Evaluation 09/01/23    Authorization Type [employer]    PT Start Time 0908    PT Stop Time 0948    PT Time Calculation (min) 40 min    Equipment Utilized During Treatment Gait belt   patient's personal FWW   Activity Tolerance Patient limited by fatigue;Patient tolerated treatment well   c/o bilat phantom limb pain   Behavior During Therapy WFL for tasks assessed/performed             Past Medical History:  Diagnosis Date   Anemia of chronic disease 05/16/2019   Anxiety    h/o   Arthritis    Chronic kidney disease    Cocaine abuse (HCC)    COPD (chronic obstructive pulmonary disease) (HCC)    Coronary artery disease    Depression    Diabetes mellitus without complication (HCC)    DKA (diabetic ketoacidosis) (HCC)    Elevated troponin    GERD (gastroesophageal reflux disease)    GI bleed    Hypertension    bp under control-off meds since 2019   Ischemic leg    Past Surgical History:  Procedure Laterality Date   ABDOMINAL HYSTERECTOMY     AMPUTATION Right 12/27/2018   Procedure: AMPUTATION BELOW KNEE;  Surgeon: Celso College, MD;  Location: [facility];  Service: General;  Laterality: Right;   AMPUTATION Left 11/05/2021   Procedure: AMPUTATION BELOW KNEE;  Surgeon: Celso College, MD;  Location: [facility];  Service: Vascular;  Laterality: Left;   AMPUTATION Left 11/04/2022   Procedure: AMPUTATION ABOVE KNEE;  Surgeon: Celso College, MD;  Location: [facility];  Service: Vascular;  Laterality: Left;   APPLICATION OF WOUND VAC  10/16/2021   Procedure: APPLICATION OF WOUND VAC;  Surgeon: Celso College, MD;  Location: [facility];  Service: Vascular;;   CATARACT EXTRACTION W/PHACO Right 03/27/2020   Procedure: CATARACT  EXTRACTION PHACO AND INTRAOCULAR LENS PLACEMENT (IOC) RIGHT DIABETIC 7.54 00:52.5;  Surgeon: Clair Crews, MD;  Location: [facility];  Service: Ophthalmology;  Laterality: Right;   CATARACT EXTRACTION W/PHACO Left 05/20/2020   Procedure: CATARACT EXTRACTION PHACO AND INTRAOCULAR LENS PLACEMENT (IOC) LEFT DIABETIC 4.95 00:37.6;  Surgeon: Clair Crews, MD;  Location: [facility];  Service: Ophthalmology;  Laterality: Left;  Diabetic - oral meds COVID + 04-24-20   CHOLECYSTECTOMY     COLONOSCOPY WITH PROPOFOL  N/A 04/16/2019   Procedure: COLONOSCOPY WITH PROPOFOL ;  Surgeon: Irby Mannan, MD;  Location: [facility];  Service: Endoscopy;  Laterality: N/A;   COLONOSCOPY WITH PROPOFOL  N/A 01/16/2020   Procedure: COLONOSCOPY WITH PROPOFOL ;  Surgeon: Irby Mannan, MD;  Location: [facility];  Service: Endoscopy;  Laterality: N/A;   ENDARTERECTOMY FEMORAL Left 10/16/2021   Procedure: ENDARTERECTOMY FEMORAL;  Surgeon: Celso College, MD;  Location: [facility];  Service: Vascular;  Laterality: Left;   ESOPHAGOGASTRODUODENOSCOPY (EGD) WITH PROPOFOL  N/A 06/15/2018   Procedure: ESOPHAGOGASTRODUODENOSCOPY (EGD) WITH PROPOFOL ;  Surgeon: Toledo, Alphonsus Jeans, MD;  Location: [facility];  Service: Gastroenterology;  Laterality: N/A;   ESOPHAGOGASTRODUODENOSCOPY (EGD) WITH PROPOFOL  N/A 01/03/2019   Procedure: ESOPHAGOGASTRODUODENOSCOPY (EGD) WITH PROPOFOL ;  Surgeon: Marnee Sink, MD;  Location: [facility];  Service: Endoscopy;  Laterality:  N/A;   ESOPHAGOGASTRODUODENOSCOPY (EGD) WITH PROPOFOL  N/A 09/25/2021   Procedure: ESOPHAGOGASTRODUODENOSCOPY (EGD) WITH PROPOFOL ;  Surgeon: Marnee Sink, MD;  Location: [facility];  Service: Endoscopy;  Laterality: N/A;   EYE SURGERY Bilateral    GIVENS CAPSULE STUDY  04/16/2019   Procedure: GIVENS CAPSULE STUDY;  Surgeon: Irby Mannan, MD;  Location: [facility];  Service: Endoscopy;;   GIVENS CAPSULE STUDY N/A  06/25/2019   Procedure: GIVENS CAPSULE STUDY;  Surgeon: Luke Salaam, MD;  Location: [facility];  Service: Gastroenterology;  Laterality: N/A;   INCISION AND DRAINAGE OF WOUND Left 02/24/2022   Procedure: LEFT BKA IRRIGATION AND DEBRIDEMENT WOUND;  Surgeon: Celso College, MD;  Location: [facility];  Service: Vascular;  Laterality: Left;   LOWER EXTREMITY ANGIOGRAPHY Right 08/21/2018   Procedure: LOWER EXTREMITY ANGIOGRAPHY;  Surgeon: Celso College, MD;  Location: [facility];  Service: Cardiovascular;  Laterality: Right;   LOWER EXTREMITY ANGIOGRAPHY Left 08/28/2018   Procedure: LOWER EXTREMITY ANGIOGRAPHY;  Surgeon: Celso College, MD;  Location: [facility];  Service: Cardiovascular;  Laterality: Left;   LOWER EXTREMITY ANGIOGRAPHY Right 08/28/2018   Procedure: Lower Extremity Angiography;  Surgeon: Celso College, MD;  Location: [facility];  Service: Cardiovascular;  Laterality: Right;   LOWER EXTREMITY ANGIOGRAPHY Right 12/18/2018   Procedure: Lower Extremity Angiography;  Surgeon: Celso College, MD;  Location: [facility];  Service: Cardiovascular;  Laterality: Right;   LOWER EXTREMITY ANGIOGRAPHY Right 12/18/2018   Procedure: Lower Extremity Angiography;  Surgeon: Celso College, MD;  Location: [facility];  Service: Cardiovascular;  Laterality: Right;   LOWER EXTREMITY ANGIOGRAPHY Left 12/21/2018   Procedure: Lower Extremity Angiography;  Surgeon: Celso College, MD;  Location: [facility];  Service: Cardiovascular;  Laterality: Left;   LOWER EXTREMITY ANGIOGRAPHY Right 12/21/2018   Procedure: Lower Extremity Angiography;  Surgeon: Celso College, MD;  Location: [facility];  Service: Cardiovascular;  Laterality: Right;   LOWER EXTREMITY ANGIOGRAPHY Left 12/25/2020   Procedure: LOWER EXTREMITY ANGIOGRAPHY;  Surgeon: Celso College, MD;  Location: [facility];  Service: Cardiovascular;  Laterality: Left;   LOWER EXTREMITY  ANGIOGRAPHY Left 09/21/2021   Procedure: Lower Extremity Angiography;  Surgeon: Celso College, MD;  Location: [facility];  Service: Cardiovascular;  Laterality: Left;   LOWER EXTREMITY ANGIOGRAPHY Left 10/13/2021   Procedure: Lower Extremity Angiography;  Surgeon: Jackquelyn Mass, MD;  Location: [facility];  Service: Cardiovascular;  Laterality: Left;   LOWER EXTREMITY ANGIOGRAPHY Left 10/14/2021   Procedure: Lower Extremity Angiography;  Surgeon: Celso College, MD;  Location: [facility];  Service: Cardiovascular;  Laterality: Left;   LOWER EXTREMITY ANGIOGRAPHY Left 11/02/2021   Procedure: Lower Extremity Angiography;  Surgeon: Celso College, MD;  Location: [facility];  Service: Cardiovascular;  Laterality: Left;   LOWER EXTREMITY INTERVENTION N/A 12/22/2018   Procedure: LOWER EXTREMITY INTERVENTION;  Surgeon: Celso College, MD;  Location: [facility];  Service: Cardiovascular;  Laterality: N/A;   LOWER EXTREMITY INTERVENTION Left 12/26/2020   Procedure: LOWER EXTREMITY INTERVENTION;  Surgeon: Celso College, MD;  Location: [facility];  Service: Cardiovascular;  Laterality: Left;   LOWER EXTREMITY INTERVENTION N/A 09/22/2021   Procedure: LOWER EXTREMITY INTERVENTION;  Surgeon: Celso College, MD;  Location: [facility];  Service: Cardiovascular;  Laterality: N/A;   Patient Active Problem List  Diagnosis Date Noted   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) 11/04/2022   Amputation above knee (HCC) 11/04/2022   Cellulitis of left lower extremity 02/23/2022   Sepsis due to cellulitis (HCC) 02/23/2022   Dyslipidemia 02/23/2022   Rheumatoid arthritis (HCC) 02/23/2022   Essential hypertension 02/23/2022   Type 2 diabetes mellitus with peripheral neuropathy (HCC) 02/23/2022   Multiple duodenal ulcers    Ischemic leg 12/25/2020   Rheumatoid arthritis of 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)  08/13/2020   Diabetes mellitus without complication (HCC)    GERD (gastroesophageal reflux disease)    COPD (chronic obstructive pulmonary disease) (HCC)    GIB (gastrointestinal bleeding)    HLD (hyperlipidemia)    Depression    Elevated troponin    Syncope    Elevated troponin I level    Anemia of chronic disease 05/16/2019   Dizziness    Polyp of colon    Acute upper GI bleeding 04/13/2019   Anemia associated with acute blood loss 04/13/2019   Chronic anticoagulation 04/13/2019   Hx of right BKA (HCC) 04/13/2019   Symptomatic anemia 04/13/2019   Upper GI bleed 04/13/2019   Acute GI bleeding 04/13/2019   Phantom pain after amputation of lower extremity (HCC) 01/30/2019   Melena    DU (duodenal ulcer)    DKA (diabetic ketoacidosis) (HCC) 12/14/2018   Atherosclerosis of artery of extremity with rest pain (HCC) 08/28/2018   PAD (peripheral artery disease) (HCC) 07/14/2018   Abdominal pain 06/13/2018   HTN (hypertension) 06/13/2018   Non-insulin  dependent type 2 diabetes mellitus (HCC) 06/13/2018   Elevated lactic acid level 06/13/2018    PCP: [facility]  REFERRING PROVIDER: Valene Gash, NP  REFERRING DIAG:  [phone] (ICD-10-CM) - Hx of right BKA (HCC)  Z89.512 (ICD-10-CM) - Hx of BKA, left (HCC)    THERAPY DIAG:  Difficulty in walking, not elsewhere classified  Muscle weakness (generalized)  Imbalance  History of amputation below knee, right (HCC)  History of above knee amputation, left (HCC)  Rationale for Evaluation and Treatment: Rehabilitation  ONSET DATE: L AKA 11/04/22   PERTINENT HISTORY: Pt is a 68 year old female s/p L AKA 11/04/22. Pt has received above-knee prosthesis for L. Pt had R BKA in Sept 2020 with prosthetic rehab in this clinic. Pt is proficient with R below-knee prosthesis.  Patient reports standing with her prosthesis at home with her sister and walking with it some at home. Pt reports dealing with phantom limb  pain. Patient reports getting this in R BKA and L AKA limbs. Patient reports Gabapentin  helps to control phantom limb symptoms. Pt had R BKA 4-5 years ago. Pt reports some tenderness along end of L residual limb. Pt reports that she has not used her prosthesis much over previous 2 weeks. She reports using shrinker each day and completing regular checks on skin integrity for L residual limb.  Type of amputation: Above-knee amputation  Onset: L AKA 11/04/22 Follow-up appt with MD: Pt following up with [facility] for check-up between May and June Pain: 710 Present, 10/10 Worst: (Pain along L hip flexor/L groin) Phantom pain: Yes History of previous amputations/PAD: Yes; Hx of R BKA  Pain quality: pain quality: sharp Numbness/Tingling: Yes Imaging: No    WEIGHT BEARING RESTRICTIONS: No  FALLS:  Has patient fallen in last 6 months? No  LIVING ENVIRONMENT: Lives with: sister, great granddaughter 18 y/o, significant other  Lives in: House/apartment; 3 steps in front  to get into home; temporary living until pt finds home with flat entryway;  Shower chair in bathroom; pt able to get in/out of bathroom Stairs: stairs into basement only, pt does not go there often Has following equipment at home: Otho Blitz - 2 wheeled, Wheelchair (power), Wheelchair (manual), shower chair, and Grab bars; sliding board for W/C transfers  PLOF: Independent with household mobility with device  PATIENT GOALS: Get back to walking and improved independence with L AKA prosthesis  NEXT MD VISIT: Between May-June   OBJECTIVE:  Note: Objective measures were completed at Evaluation unless otherwise noted.  MUSCULOSKELETAL: Tremor: Absent Tone: Normal, no spasticity, rigidity, or clonus Residual limb appears well-healing with no signs of erythema, skin breakdown, infection, or drainage  Posture Forward flexed posture in static standing  Palpation No pain to palpation along distal surface of residual limb.  No  pain with palpation to quadriceps or hamstrings.    PATIENT SURVEYS:  ABC scale 6.3%  COGNITION: Overall cognitive status: Within functional limits for tasks assessed     SENSATION: Not tested   LOWER EXTREMITY ROM:  Active ROM Right eval Left eval  Hip flexion WNL WNL  Hip extension WNL -5  Hip abduction 40 40  Hip adduction    Hip internal rotation    Hip external rotation    Knee flexion    Knee extension    Ankle dorsiflexion    Ankle plantarflexion    Ankle inversion    Ankle eversion     (Blank rows = not tested)  LOWER EXTREMITY MMT:  MMT Right eval Left eval  Hip flexion 4+ 4  Hip extension 4 4  Hip abduction 4+ 4+  Hip adduction    Hip internal rotation    Hip external rotation    Knee flexion 4+   Knee extension 4+   Ankle dorsiflexion    Ankle plantarflexion    Ankle inversion    Ankle eversion     (Blank rows = not tested)   FUNCTIONAL TASKS:  W/C to bed transfer: CGA with sit to stand, pt presents with heavy forward lean and weight shift toward RLE, heavy upper limb push on FWW once pt has hands on walker. Heavy verbal cueing to push from seat versus walker for transferring.  Sit to stand: Difficulty initiating with slowed velocity of first 25% of transfer following lift from mat.   GAIT: Distance walked: 60 ft Assistive device utilized: Walker - 2 wheeled Level of assistance: CGA Comments: Heavy upper limb support on walker. Step-to pattern with RLE leading. Decreased terminal knee extension of L prosthesis at initial contact that can be partially corrected with verbal cueing for swing technique. Decreased LLE weight shift during stance phase. Decreased hip extension during mid-stance to terminal stance.  TREATMENT DATE: 08/30/2023   SUBJECTIVE STATEMENT:   Patient reports wearing her prosthesis since 7:30 this  morning and spending more time in it each day (up to 3 hours at a time). She reports her sister noted that her gait velocity was notably increased with use of FWW. Pt voices some discomfort in R below-knee prosthesis this AM.   Therapeutic Activities - patient education, repetitive task practice for improved performance of daily functional activities e.g. transferring, weight transfer practice to improve static standing activities and weight shift during ambulation   Ambulate laps around gym x 2 with bilateral prostheses and FWW with sliders  -PT CGA  Sit to stand with dec UE support, pushing from seat only and attaining upright posture/bilateral stance IND; x 5 repetitions  Ambulate in hallway x 2 laps D/B with additional lap in gym with rollator, step-through gait pattern;   -Pt CGA, encouraged step-through pattern with continuous stepping  -stopped one time during gait trial to adjust fitting of L above-knee socket   *Utilized mirror to adjust fitting of R below-knee prosthesis; patient independently manages prosthesis and liner/sleeve  -troubleshooting for fitting of prosthetic liner to ensure pin is centered on residual limb   Forward gait in bars with unilateral R upper limb support, step-to pattern with LLE first; x 5 D/B length of bars  -bilat UE support for 180-degree turn at end of bars   PATIENT EDUCATION: Discussed current progress and likely transition of assistive devices as pt improves with safety c bilateral prostheses.    *next visit* Functional reach with no UE support (intermittent contact with bar if losing balance); 8 cones - stacking and unstacking, placing on adjacent raised table; x 5 minutes Forward step up to 6-inch step; x10 with RLE above-knee prosthesis leading Stair task practice  *not today* Forward step-up; RLE first with LLE trailing; x 8 reps Sit to stand with chair + Airex; task practice to reduce upper limb assistance and controlled descent; x 5  reps Weight shifting with bilateral symmetrical stance and no UE support alternating R/L; x 60 sec   PATIENT EDUCATION:  Education details: see above for patient education details Person educated: Patient Education method: Explanation, Demonstration, and Handouts Education comprehension: verbalized understanding and returned demonstration  HOME EXERCISE PROGRAM: Access Code: [email or web address] URL: [email or web address] Date: 06/23/2023 Prepared by: Denese Finn  Exercises - Prone Hip Flexor Stretch with Towel Roll (AKA)  - 2-3 x daily - 7 x weekly - 1 sets - 2-3 min hold - Supine Hip Abduction  - 2 x daily - 7 x weekly - 2 sets - 10 reps - Sidelying Hip Abduction (AKA)  - 2 x daily - 7 x weekly - 2 sets - 10 reps - Prone Hip Extension with Residual Limb (AKA)  - 2 x daily - 7 x weekly - 2 sets - 10 reps  ASSESSMENT:  CLINICAL IMPRESSION: Pt is remarkably improving with ability to perform step through pattern with both rollator and with unilateral upper limb support in // bars. She seldom has issue with LOB with bilateral prosthesis; only one event today during which her L above-knee prosthetic foot caught on floor during swing phase (pt leaned onto walker and PT supported pt with ModA to prevent further buckling or potential fall). Pt exhibits improving ability to perform sit to stand and transferring with bilateral prostheses. Patient presents with remaining deficits including: prosthetic gait deviations, hip weakness, imbalance, and difficulty with weight shifting/transfers. Pt will continue to benefit from skilled PT services  to address deficits and improve function.  OBJECTIVE IMPAIRMENTS: Abnormal gait, decreased balance, decreased mobility, difficulty walking, decreased strength, prosthetic dependency , and pain.   ACTIVITY LIMITATIONS: carrying, standing, stairs, transfers, bed mobility, and locomotion level  PARTICIPATION LIMITATIONS: cleaning, laundry, shopping, and  community activity  PERSONAL FACTORS: Age, Past/current experiences, and 3+ comorbidities: (anxiety, depression, CKD, COPD, DM, DKA, HTN lower extremity ischemia) are also affecting patient's functional outcome.   REHAB POTENTIAL: Good  CLINICAL DECISION MAKING: Evolving/moderate complexity  EVALUATION COMPLEXITY: Moderate   GOALS: Goals reviewed with patient? Yes  SHORT TERM GOALS: Target date: 07/14/23 Patient will be independent and 100% compliant with HEP as needed for carryover of PT intervention Baseline: 06/21/23: Baseline HEP reviewed.  08/04/23: largely compliant with HEP.  Goal status: IN PROGRESS/MOSTLY MET  2.  Patient will attain L hip hyperextension to 15 deg indicative of improved ROM required for terminal stance/heel off during gait Baseline: 06/21/23: L hip extension -5.   08/04/23: 5 deg hyperextension Goal status: IN PROGRESS  3.  Patient will demonstrate modified-independent sit to stand and stand-pivot transfer from W/C to adjacent sitting surface with no LOB and no verbal cueing required from therapist for technique Baseline: 06/21/23: Difficulty and some cueing needed for safety with transferring and stand-pivot transfer.   08/04/23: completed with supervision level of assist  Goal status: IN PROGRESS/MOSTLY MET   LONG TERM GOALS: Target date: 09/01/23  Patient will ambulate at community-level distance with front-wheeled walker ModI with no LOB and normalized hip extension and swing through for L above-knee prosthesis Baseline: 06/21/23: Noted gait deviations and pt ambulating short of household distance with L AKA prosthesis.   08/04/23: Ambulates for home distance with CGA. Limited capacity for community-level distance walking with FWW.  Goal status: IN PROGRESS  2.  Pt will improve ABC Scale by 13% or greater indicative of improved confidence related to balance and fall risk and clinically meaningful change Baseline: 06/21/23: ABC Scale to be obtained on visit # 2.    06/27/23: 6.3%  08/04/23: 8.8% Goal status: NOT MET  3.  Pt will perform TUG in less than 20 seconds indicative of decreased risk of falls relative to patient-specific population Baseline: 06/21/23: TUG to be completed on visit # 2.   06/27/23: 136 sec.  08/04/23: Deferred to next visit.    08/09/23: 59.1 sec.  Goal status: IN PROGRESS  4.  Pt will improve Amputee Mobility Predictor by 4 points or greater indicative of detectable change in ADLs and functional mobility with prostheses.  Baseline: 06/21/23: AMPPRO to be completed on visits #2-3.  07/06/23: 23/47 08/04/23:  22/47. Goal status: NOT MET   5.  Patient will negotiate 3 steps ModI with FWW as needed for entering/exiting her home. Baseline: 06/21/23: Significant difficulty with stair negotiation.  08/04/23: Completed today with ModA x 2 with heavy upper limb assist.  Goal status: IN PROGRESS    PLAN:  PT FREQUENCY: 2x/week  PT DURATION: 10 weeks  PLANNED INTERVENTIONS: 97164- PT Re-evaluation, 97110-Therapeutic exercises, 97530- Therapeutic activity, [id number]- Neuromuscular re-education, 97535- Self Care, [id number]- Prosthetic training, Patient/Family education, and Balance training  PLAN FOR NEXT SESSION: Continue with weight shifting and gait training with new L AKA prosthesis, correcting gait deviations. Progress with lessening upper limb support as able to negotiating various obstacles/terrain with bilat prostheses. Open-chain strengthening for BLE.    Denese Finn, PT, DPT #[id number]  Aleatha Hunting, PT 08/30/2023, 10:08 AM

## 2023-09-01 ENCOUNTER — Ambulatory Visit: Payer: 59 | Admitting: Physical Therapy

## 2023-09-01 ENCOUNTER — Encounter: Payer: Self-pay | Admitting: Physical Therapy

## 2023-09-01 ENCOUNTER — Inpatient Hospital Stay

## 2023-09-01 ENCOUNTER — Encounter: Payer: Self-pay | Admitting: Oncology

## 2023-09-01 ENCOUNTER — Inpatient Hospital Stay (HOSPITAL_BASED_OUTPATIENT_CLINIC_OR_DEPARTMENT_OTHER): Admitting: Oncology

## 2023-09-01 VITALS — BP 171/76 | HR 104 | Temp 97.3°F | Resp 20 | Wt 132.3 lb

## 2023-09-01 DIAGNOSIS — I129 Hypertensive chronic kidney disease with stage 1 through stage 4 chronic kidney disease, or unspecified chronic kidney disease: Secondary | ICD-10-CM | POA: Diagnosis not present

## 2023-09-01 DIAGNOSIS — D649 Anemia, unspecified: Secondary | ICD-10-CM | POA: Diagnosis not present

## 2023-09-01 DIAGNOSIS — D638 Anemia in other chronic diseases classified elsewhere: Secondary | ICD-10-CM | POA: Diagnosis not present

## 2023-09-01 DIAGNOSIS — Z89511 Acquired absence of right leg below knee: Secondary | ICD-10-CM

## 2023-09-01 DIAGNOSIS — M6281 Muscle weakness (generalized): Secondary | ICD-10-CM

## 2023-09-01 DIAGNOSIS — R2689 Other abnormalities of gait and mobility: Secondary | ICD-10-CM

## 2023-09-01 DIAGNOSIS — Z89612 Acquired absence of left leg above knee: Secondary | ICD-10-CM

## 2023-09-01 DIAGNOSIS — R262 Difficulty in walking, not elsewhere classified: Secondary | ICD-10-CM

## 2023-09-01 NOTE — Progress Notes (Signed)
 Hematology/Oncology Progress note [phone] [phone]     REFERRING PROVIDER: [facility] CHIEF COMPLAINTS/REASON FOR VISIT:  Follow up of anemia  ASSESSMENT & PLAN:   Anemia of chronic disease Anemia of chronic disease - RA and CKD, marrow suppression from methotrexate Labs are reviewed and discussed with patient.   Lab Results  Component Value Date   HGB 10.3 (L) 08/22/2023   TIBC 332 08/22/2023   IRONPCTSAT 38 (H) 08/22/2023   FERRITIN 233 08/22/2023    Hemoglobin is stable at her baseline, no iron  deficiency.  Recommend observation.     Orders Placed This Encounter  Procedures   CBC with Differential (Cancer Center Only)    Standing Status:   Future    Expected Date:   01/02/2024    Expiration Date:   08/31/2024   Iron  and TIBC    Standing Status:   Future    Expected Date:   01/02/2024    Expiration Date:   08/31/2024   Ferritin    Standing Status:   Future    Expected Date:   01/02/2024    Expiration Date:   08/31/2024   Retic Panel    Standing Status:   Future    Expected Date:   01/02/2024    Expiration Date:   08/31/2024   Follow-up in 4 months All questions were answered. The patient knows to call the clinic with any problems, questions or concerns.  Timmy Forbes, MD, PhD [facility] Hematology Oncology 09/01/2023    HISTORY OF PRESENTING ILLNESS:   Reviewed patient's recent labs  05/09/2019, hemoglobin 8.1, MCV 88, platelet 470. 04/16/2019, iron  panel showed saturation 18, ferritin 28, TIBC 308 Patient has been started on oral iron  supplementation NiFerrex 150 mg daily.  She reports tolerating well.  Reviewed patient's previous labs ordered by primary care physician's office, anemia is chronic onset , duration is since at least July 2017.  Patient's hemoglobin was about 9 back in September 2020.  She was admitted at that time due to severe PAD and right foot gangrene.  Status post right below-knee amputation.   She was  admitted in December due to acute drop of hemoglobin to 6.  She has a history of GI bleed secondary to gastric ulcer.  Received PRBC transfusion.  Patient is on Eliquis  2.5 mg twice daily. No aggravating or improving factors.  Last endoscopy: 04/13/2019 EGD colonoscopy and small bowel capsule study during her hospitalization.  EGD was negative, 04/16/2019 colonoscopy with small polyps removed, 04/18/2019 small bowel capsule study did not reach this small bowel and was inconclusive.   History of recurrent acute GI bleeding/melena, history of PUD on chronic anticoagulation. 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    RA, follows up with rheumatology Dr.Patel. On MTX with folic acid  and plaquenil .   11/05/2021-11/09/2021, patient was hospitalized due to peripheral artery disease, status post left below-knee amputation. Patient is  on Eliquis  5 mg twice daily as well as aspirin  81 mg daily.  INTERVAL HISTORY Sylvia [name] is a 68 y.o. female who has above history reviewed by me today presents for follow up visit for management of anemia Patient reports feeling okay.  Chronic fatigue unchanged. She has no new complaints.  Review of Systems  Constitutional:  Positive for fatigue. Negative for appetite change, chills, fever and unexpected weight change.  HENT:   Negative for hearing loss and voice change.   Eyes:  Negative for eye problems.  Respiratory:  Negative for chest tightness  and cough.   Cardiovascular:  Negative for chest pain.  Gastrointestinal:  Negative for abdominal distention, abdominal pain and blood in stool.  Endocrine: Negative for hot flashes.  Genitourinary:  Negative for difficulty urinating and frequency.   Musculoskeletal:  Negative for arthralgias.  Skin:  Negative for itching and rash.  Neurological:  Negative for extremity weakness.  Hematological:  Negative for adenopathy.  Psychiatric/Behavioral:  Negative for confusion.     MEDICAL HISTORY:  Past  Medical History:  Diagnosis Date   Anemia of chronic disease 05/16/2019   Anxiety    h/o   Arthritis    Chronic kidney disease    Cocaine abuse (HCC)    COPD (chronic obstructive pulmonary disease) (HCC)    Coronary artery disease    Depression    Diabetes mellitus without complication (HCC)    DKA (diabetic ketoacidosis) (HCC)    Elevated troponin    GERD (gastroesophageal reflux disease)    GI bleed    Hypertension    bp under control-off meds since 2019   Ischemic leg     SURGICAL HISTORY: Past Surgical History:  Procedure Laterality Date   ABDOMINAL HYSTERECTOMY     AMPUTATION Right 12/27/2018   Procedure: AMPUTATION BELOW KNEE;  Surgeon: Celso College, MD;  Location: [facility];  Service: General;  Laterality: Right;   AMPUTATION Left 11/05/2021   Procedure: AMPUTATION BELOW KNEE;  Surgeon: Celso College, MD;  Location: [facility];  Service: Vascular;  Laterality: Left;   AMPUTATION Left 11/04/2022   Procedure: AMPUTATION ABOVE KNEE;  Surgeon: Celso College, MD;  Location: [facility];  Service: Vascular;  Laterality: Left;   APPLICATION OF WOUND VAC  10/16/2021   Procedure: APPLICATION OF WOUND VAC;  Surgeon: Celso College, MD;  Location: [facility];  Service: Vascular;;   CATARACT EXTRACTION W/PHACO Right 03/27/2020   Procedure: CATARACT EXTRACTION PHACO AND INTRAOCULAR LENS PLACEMENT (IOC) RIGHT DIABETIC 7.54 00:52.5;  Surgeon: Clair Crews, MD;  Location: [facility];  Service: Ophthalmology;  Laterality: Right;   CATARACT EXTRACTION W/PHACO Left 05/20/2020   Procedure: CATARACT EXTRACTION PHACO AND INTRAOCULAR LENS PLACEMENT (IOC) LEFT DIABETIC 4.95 00:37.6;  Surgeon: Clair Crews, MD;  Location: [facility];  Service: Ophthalmology;  Laterality: Left;  Diabetic - oral meds COVID + 04-24-20   CHOLECYSTECTOMY     COLONOSCOPY WITH PROPOFOL  N/A 04/16/2019   Procedure: COLONOSCOPY WITH PROPOFOL ;  Surgeon: Irby Mannan, MD;  Location: [facility];   Service: Endoscopy;  Laterality: N/A;   COLONOSCOPY WITH PROPOFOL  N/A 01/16/2020   Procedure: COLONOSCOPY WITH PROPOFOL ;  Surgeon: Irby Mannan, MD;  Location: [facility];  Service: Endoscopy;  Laterality: N/A;   ENDARTERECTOMY FEMORAL Left 10/16/2021   Procedure: ENDARTERECTOMY FEMORAL;  Surgeon: Celso College, MD;  Location: [facility];  Service: Vascular;  Laterality: Left;   ESOPHAGOGASTRODUODENOSCOPY (EGD) WITH PROPOFOL  N/A 06/15/2018   Procedure: ESOPHAGOGASTRODUODENOSCOPY (EGD) WITH PROPOFOL ;  Surgeon: Toledo, Alphonsus Jeans, MD;  Location: [facility];  Service: Gastroenterology;  Laterality: N/A;   ESOPHAGOGASTRODUODENOSCOPY (EGD) WITH PROPOFOL  N/A 01/03/2019   Procedure: ESOPHAGOGASTRODUODENOSCOPY (EGD) WITH PROPOFOL ;  Surgeon: Marnee Sink, MD;  Location: [facility];  Service: Endoscopy;  Laterality: N/A;   ESOPHAGOGASTRODUODENOSCOPY (EGD) WITH PROPOFOL  N/A 09/25/2021   Procedure: ESOPHAGOGASTRODUODENOSCOPY (EGD) WITH PROPOFOL ;  Surgeon: Marnee Sink, MD;  Location: [facility];  Service: Endoscopy;  Laterality: N/A;   EYE SURGERY Bilateral    GIVENS CAPSULE STUDY  04/16/2019   Procedure: GIVENS CAPSULE STUDY;  Surgeon: Tahiliani, Varnita  B, MD;  Location: [facility];  Service: Endoscopy;;   GIVENS CAPSULE STUDY N/A 06/25/2019   Procedure: GIVENS CAPSULE STUDY;  Surgeon: Luke Salaam, MD;  Location: [facility];  Service: Gastroenterology;  Laterality: N/A;   INCISION AND DRAINAGE OF WOUND Left 02/24/2022   Procedure: LEFT BKA IRRIGATION AND DEBRIDEMENT WOUND;  Surgeon: Celso College, MD;  Location: [facility];  Service: Vascular;  Laterality: Left;   LOWER EXTREMITY ANGIOGRAPHY Right 08/21/2018   Procedure: LOWER EXTREMITY ANGIOGRAPHY;  Surgeon: Celso College, MD;  Location: [facility];  Service: Cardiovascular;  Laterality: Right;   LOWER EXTREMITY ANGIOGRAPHY Left 08/28/2018   Procedure: LOWER EXTREMITY ANGIOGRAPHY;  Surgeon: Celso College, MD;  Location:  [facility];  Service: Cardiovascular;  Laterality: Left;   LOWER EXTREMITY ANGIOGRAPHY Right 08/28/2018   Procedure: Lower Extremity Angiography;  Surgeon: Celso College, MD;  Location: [facility];  Service: Cardiovascular;  Laterality: Right;   LOWER EXTREMITY ANGIOGRAPHY Right 12/18/2018   Procedure: Lower Extremity Angiography;  Surgeon: Celso College, MD;  Location: [facility];  Service: Cardiovascular;  Laterality: Right;   LOWER EXTREMITY ANGIOGRAPHY Right 12/18/2018   Procedure: Lower Extremity Angiography;  Surgeon: Celso College, MD;  Location: [facility];  Service: Cardiovascular;  Laterality: Right;   LOWER EXTREMITY ANGIOGRAPHY Left 12/21/2018   Procedure: Lower Extremity Angiography;  Surgeon: Celso College, MD;  Location: [facility];  Service: Cardiovascular;  Laterality: Left;   LOWER EXTREMITY ANGIOGRAPHY Right 12/21/2018   Procedure: Lower Extremity Angiography;  Surgeon: Celso College, MD;  Location: [facility];  Service: Cardiovascular;  Laterality: Right;   LOWER EXTREMITY ANGIOGRAPHY Left 12/25/2020   Procedure: LOWER EXTREMITY ANGIOGRAPHY;  Surgeon: Celso College, MD;  Location: [facility];  Service: Cardiovascular;  Laterality: Left;   LOWER EXTREMITY ANGIOGRAPHY Left 09/21/2021   Procedure: Lower Extremity Angiography;  Surgeon: Celso College, MD;  Location: [facility];  Service: Cardiovascular;  Laterality: Left;   LOWER EXTREMITY ANGIOGRAPHY Left 10/13/2021   Procedure: Lower Extremity Angiography;  Surgeon: Jackquelyn Mass, MD;  Location: [facility];  Service: Cardiovascular;  Laterality: Left;   LOWER EXTREMITY ANGIOGRAPHY Left 10/14/2021   Procedure: Lower Extremity Angiography;  Surgeon: Celso College, MD;  Location: [facility];  Service: Cardiovascular;  Laterality: Left;   LOWER EXTREMITY ANGIOGRAPHY Left 11/02/2021   Procedure: Lower Extremity Angiography;  Surgeon: Celso College, MD;  Location: [facility];  Service: Cardiovascular;  Laterality: Left;   LOWER EXTREMITY INTERVENTION N/A 12/22/2018   Procedure: LOWER EXTREMITY INTERVENTION;  Surgeon: Celso College, MD;  Location: [facility];  Service: Cardiovascular;  Laterality: N/A;   LOWER EXTREMITY INTERVENTION Left 12/26/2020   Procedure: LOWER EXTREMITY INTERVENTION;  Surgeon: Celso College, MD;  Location: [facility];  Service: Cardiovascular;  Laterality: Left;   LOWER EXTREMITY INTERVENTION N/A 09/22/2021   Procedure: LOWER EXTREMITY INTERVENTION;  Surgeon: Celso College, MD;  Location: [facility];  Service: Cardiovascular;  Laterality: N/A;    SOCIAL HISTORY: Social History   Socioeconomic History   Marital status: Divorced    Spouse name: Not on file   Number of children: 2   Years of education: Not on file   Highest education level: Not on file  Occupational History   Not on file  Tobacco Use   Smoking status: Former  Current packs/day: 0.25    Average packs/day: 0.3 packs/day for 45.0 years (11.3 ttl pk-yrs)    Types: Cigarettes   Smokeless tobacco: Never   Tobacco comments:    Half a pack a day   Vaping Use   Vaping status: Never Used  Substance and Sexual Activity   Alcohol use: No   Drug use: Not Currently    Types: Cocaine    Comment: last used in April 2021 per patient-last 2 drug tests have been negative for cocaine   Sexual activity: Not Currently  Other Topics Concern   Not on file  Social History Narrative   ** Merged History Encounter **       5 grandchildren and 5 great grandchildren  2 grandchildren live with Pt. (25 & 82 y.o.)   Social Drivers of Corporate investment banker Strain: Low Risk  (06/14/2018)   Overall Financial Resource Strain (CARDIA)    Difficulty of Paying Living Expenses: Not hard at all  Food Insecurity: No Food Insecurity (11/04/2022)   Hunger Vital Sign    Worried About Running Out of Food in the Last  Year: Never true    Ran Out of Food in the Last Year: Never true  Transportation Needs: No Transportation Needs (11/04/2022)   PRAPARE - Administrator, Civil Service (Medical): No    Lack of Transportation (Non-Medical): No  Physical Activity: Insufficiently Active (06/14/2018)   Exercise Vital Sign    Days of Exercise per Week: 4 days    Minutes of Exercise per Session: 20 min  Stress: No Stress Concern Present (06/14/2018)   [employer] of Occupational Health - Occupational Stress Questionnaire    Feeling of Stress : Only a little  Social Connections: Unknown (06/14/2018)   Social Connection and Isolation Panel [NHANES]    Frequency of Communication with Friends and Family: Patient declined    Frequency of Social Gatherings with Friends and Family: Patient declined    Attends Religious Services: Patient declined    Database administrator or Organizations: Patient declined    Attends Banker Meetings: Patient declined    Marital Status: Patient declined  Intimate Partner Violence: Not At Risk (11/04/2022)   Humiliation, Afraid, Rape, and Kick questionnaire    Fear of Current or Ex-Partner: No    Emotionally Abused: No    Physically Abused: No    Sexually Abused: No    FAMILY HISTORY: Family History  Problem Relation Age of Onset   Heart attack Mother    Breast cancer Mother 62    ALLERGIES:  is allergic to vancomycin , hydrocodone , metformin and related, penicillins, and tramadol .  MEDICATIONS:  Current Outpatient Medications  Medication Sig Dispense Refill   acetaminophen  (TYLENOL ) 500 MG tablet Take 500 mg by mouth every 6 (six) hours as needed.     albuterol  (PROVENTIL  HFA;VENTOLIN  HFA) 108 (90 Base) MCG/ACT inhaler Inhale 2 puffs into the lungs every 6 (six) hours as needed for wheezing or shortness of breath. 1 Inhaler 2   apixaban  (ELIQUIS ) 5 MG TABS tablet Take 1 tablet (5 mg total) by mouth 2 (two) times daily. 60 tablet 12   atorvastatin   (LIPITOR) 80 MG tablet Take 80 mg by mouth daily.     cetirizine (ZYRTEC) 10 MG tablet Take 10 mg by mouth daily as needed for allergies.     cyclobenzaprine  (FLEXERIL ) 10 MG tablet Take 1 tablet (10 mg total) by mouth 3 (three) times daily as needed for muscle  spasms. Take 1/2 a tablet as needed for pain 90 tablet 1   DULoxetine  (CYMBALTA ) 60 MG capsule Take 1 capsule (60 mg total) by mouth daily. 30 capsule 3   ferrous sulfate  325 (65 FE) MG tablet Take 325 mg by mouth daily.      folic acid  (FOLVITE ) 1 MG tablet Take 1 mg by mouth daily.     gabapentin  (NEURONTIN ) 300 MG capsule TAKE 3 CAPSULES BY MOUTH IN THE  MORNING AND 3 CAPSULES BY MOUTH  IN THE EVENING 450 capsule 3   hydroxychloroquine  (PLAQUENIL ) 200 MG tablet Take 200 mg by mouth daily.     JARDIANCE  25 MG TABS tablet Take 25 mg by mouth daily.     lisinopril  (ZESTRIL ) 40 MG tablet Take 40 mg by mouth daily.     methotrexate (RHEUMATREX) 2.5 MG tablet Take 2.5 mg by mouth once a week.     metoprolol  succinate (TOPROL -XL) 50 MG 24 hr tablet Take 1 tablet (50 mg total) by mouth daily. Take with or immediately following a meal. 90 tablet 3   Multiple Vitamins-Minerals (WOMENS MULTIVITAMIN PO) Take 1 tablet by mouth daily.     nicotine  (NICODERM CQ  - DOSED IN MG/24 HOURS) 21 mg/24hr patch Place 1 patch (21 mg total) onto the skin daily. 28 patch 1   pantoprazole  (PROTONIX ) 40 MG tablet Take 1 tablet (40 mg total) by mouth daily. 30 tablet 11   VICTOZA  18 MG/3ML SOPN Inject 0.6 mg into the skin daily.     vitamin B-12 (CYANOCOBALAMIN ) 500 MCG tablet Take 1 tablet (500 mcg total) by mouth daily. 90 tablet 1   VOLTAREN ARTHRITIS PAIN 1 % GEL Apply 2 g topically 4 (four) times daily as needed.     oxyCODONE  (ROXICODONE ) 15 MG immediate release tablet Take 1 tablet (15 mg total) by mouth every 6 (six) hours as needed for pain. (Patient not taking: Reported on 05/25/2023) 30 tablet 0   No current facility-administered medications for this visit.      PHYSICAL EXAMINATION: ECOG PERFORMANCE STATUS: 1 - Symptomatic but completely ambulatory Vitals:   09/01/23 1414  BP: (!) 171/76  Pulse: (!) 104  Resp: 20  Temp: (!) 97.3 F (36.3 C)  SpO2: 100%   Filed Weights   09/01/23 1414  Weight: 132 lb 4.8 oz (60 kg)    Physical Exam Constitutional:      General: She is not in acute distress.    Comments: Patient walks with a cane  HENT:     Head: Normocephalic and atraumatic.  Eyes:     General: No scleral icterus. Cardiovascular:     Rate and Rhythm: Normal rate.  Pulmonary:     Effort: Pulmonary effort is normal. No respiratory distress.     Breath sounds: No wheezing.  Abdominal:     General: Bowel sounds are normal. There is no distension.     Palpations: Abdomen is soft.  Musculoskeletal:        General: No deformity. Normal range of motion.     Cervical back: Normal range of motion and neck supple.     Comments: History of right below-knee amputation History of left below-knee amputation  Skin:    General: Skin is warm and dry.     Findings: No rash.  Neurological:     Mental Status: She is alert and oriented to person, place, and time. Mental status is at baseline.  Psychiatric:        Mood and Affect: Mood  normal.      LABORATORY DATA:  I have reviewed the data as listed    Latest Ref Rng & Units 08/22/2023   10:10 AM 04/18/2023   11:36 AM 12/10/2022   11:49 AM  CBC  WBC 4.0 - 10.5 K/uL 9.6  9.7  8.2   Hemoglobin 12.0 - 15.0 g/dL 78.2  9.8  95.6   Hematocrit 36.0 - 46.0 % 30.6  28.5  29.8   Platelets 150 - 400 K/uL 406  322  303       Latest Ref Rng & Units 11/07/2022    4:44 AM 11/06/2022    4:39 AM 11/05/2022    9:03 AM  CMP  Glucose 70 - 99 mg/dL 213  086  578   BUN 8 - 23 mg/dL 18  13  14    Creatinine 0.44 - 1.00 mg/dL 4.69  6.29  5.28   Sodium 135 - 145 mmol/L 138  140  141   Potassium 3.5 - 5.1 mmol/L 3.5  3.6  3.7   Chloride 98 - 111 mmol/L 110  112  112   CO2 22 - 32 mmol/L 23  23  21     Calcium  8.9 - 10.3 mg/dL 8.8  8.7  8.9   Total Protein 6.5 - 8.1 g/dL 6.4     Total Bilirubin 0.3 - 1.2 mg/dL 0.7     Alkaline Phos 38 - 126 U/L 80     AST 15 - 41 U/L 12     ALT 0 - 44 U/L 10        Iron /TIBC/Ferritin/ %Sat    Component Value Date/Time   IRON  127 08/22/2023 1010   TIBC 332 08/22/2023 1010   FERRITIN 233 08/22/2023 1010   IRONPCTSAT 38 (H) 08/22/2023 1010

## 2023-09-01 NOTE — Therapy (Signed)
 OUTPATIENT PHYSICAL THERAPY TREATMENT    Patient Name: Sylvia [name] MRN: [id number] DOB:09/09/1955, 68 y.o., female Today's Date: 09/01/2023   END OF SESSION:  PT End of Session - 09/01/23 0931     Visit Number 16    Number of Visits 21    Date for PT Re-Evaluation 09/01/23    Authorization Type [employer]    PT Start Time 0913    PT Stop Time 0947    PT Time Calculation (min) 34 min    Equipment Utilized During Treatment Gait belt   patient's personal FWW   Activity Tolerance Patient limited by fatigue;Patient tolerated treatment well   c/o bilat phantom limb pain   Behavior During Therapy WFL for tasks assessed/performed              Past Medical History:  Diagnosis Date   Anemia of chronic disease 05/16/2019   Anxiety    h/o   Arthritis    Chronic kidney disease    Cocaine abuse (HCC)    COPD (chronic obstructive pulmonary disease) (HCC)    Coronary artery disease    Depression    Diabetes mellitus without complication (HCC)    DKA (diabetic ketoacidosis) (HCC)    Elevated troponin    GERD (gastroesophageal reflux disease)    GI bleed    Hypertension    bp under control-off meds since 2019   Ischemic leg    Past Surgical History:  Procedure Laterality Date   ABDOMINAL HYSTERECTOMY     AMPUTATION Right 12/27/2018   Procedure: AMPUTATION BELOW KNEE;  Surgeon: Celso College, MD;  Location: [facility];  Service: General;  Laterality: Right;   AMPUTATION Left 11/05/2021   Procedure: AMPUTATION BELOW KNEE;  Surgeon: Celso College, MD;  Location: [facility];  Service: Vascular;  Laterality: Left;   AMPUTATION Left 11/04/2022   Procedure: AMPUTATION ABOVE KNEE;  Surgeon: Celso College, MD;  Location: [facility];  Service: Vascular;  Laterality: Left;   APPLICATION OF WOUND VAC  10/16/2021   Procedure: APPLICATION OF WOUND VAC;  Surgeon: Celso College, MD;  Location: [facility];  Service: Vascular;;   CATARACT EXTRACTION W/PHACO Right 03/27/2020   Procedure: CATARACT  EXTRACTION PHACO AND INTRAOCULAR LENS PLACEMENT (IOC) RIGHT DIABETIC 7.54 00:52.5;  Surgeon: Clair Crews, MD;  Location: [facility];  Service: Ophthalmology;  Laterality: Right;   CATARACT EXTRACTION W/PHACO Left 05/20/2020   Procedure: CATARACT EXTRACTION PHACO AND INTRAOCULAR LENS PLACEMENT (IOC) LEFT DIABETIC 4.95 00:37.6;  Surgeon: Clair Crews, MD;  Location: [facility];  Service: Ophthalmology;  Laterality: Left;  Diabetic - oral meds COVID + 04-24-20   CHOLECYSTECTOMY     COLONOSCOPY WITH PROPOFOL  N/A 04/16/2019   Procedure: COLONOSCOPY WITH PROPOFOL ;  Surgeon: Irby Mannan, MD;  Location: [facility];  Service: Endoscopy;  Laterality: N/A;   COLONOSCOPY WITH PROPOFOL  N/A 01/16/2020   Procedure: COLONOSCOPY WITH PROPOFOL ;  Surgeon: Irby Mannan, MD;  Location: [facility];  Service: Endoscopy;  Laterality: N/A;   ENDARTERECTOMY FEMORAL Left 10/16/2021   Procedure: ENDARTERECTOMY FEMORAL;  Surgeon: Celso College, MD;  Location: [facility];  Service: Vascular;  Laterality: Left;   ESOPHAGOGASTRODUODENOSCOPY (EGD) WITH PROPOFOL  N/A 06/15/2018   Procedure: ESOPHAGOGASTRODUODENOSCOPY (EGD) WITH PROPOFOL ;  Surgeon: Toledo, Alphonsus Jeans, MD;  Location: [facility];  Service: Gastroenterology;  Laterality: N/A;   ESOPHAGOGASTRODUODENOSCOPY (EGD) WITH PROPOFOL  N/A 01/03/2019   Procedure: ESOPHAGOGASTRODUODENOSCOPY (EGD) WITH PROPOFOL ;  Surgeon: Marnee Sink, MD;  Location: [facility];  Service: Endoscopy;  Laterality: N/A;   ESOPHAGOGASTRODUODENOSCOPY (EGD) WITH PROPOFOL  N/A 09/25/2021   Procedure: ESOPHAGOGASTRODUODENOSCOPY (EGD) WITH PROPOFOL ;  Surgeon: Marnee Sink, MD;  Location: [facility];  Service: Endoscopy;  Laterality: N/A;   EYE SURGERY Bilateral    GIVENS CAPSULE STUDY  04/16/2019   Procedure: GIVENS CAPSULE STUDY;  Surgeon: Irby Mannan, MD;  Location: [facility];  Service: Endoscopy;;   GIVENS CAPSULE STUDY N/A  06/25/2019   Procedure: GIVENS CAPSULE STUDY;  Surgeon: Luke Salaam, MD;  Location: [facility];  Service: Gastroenterology;  Laterality: N/A;   INCISION AND DRAINAGE OF WOUND Left 02/24/2022   Procedure: LEFT BKA IRRIGATION AND DEBRIDEMENT WOUND;  Surgeon: Celso College, MD;  Location: [facility];  Service: Vascular;  Laterality: Left;   LOWER EXTREMITY ANGIOGRAPHY Right 08/21/2018   Procedure: LOWER EXTREMITY ANGIOGRAPHY;  Surgeon: Celso College, MD;  Location: [facility];  Service: Cardiovascular;  Laterality: Right;   LOWER EXTREMITY ANGIOGRAPHY Left 08/28/2018   Procedure: LOWER EXTREMITY ANGIOGRAPHY;  Surgeon: Celso College, MD;  Location: [facility];  Service: Cardiovascular;  Laterality: Left;   LOWER EXTREMITY ANGIOGRAPHY Right 08/28/2018   Procedure: Lower Extremity Angiography;  Surgeon: Celso College, MD;  Location: [facility];  Service: Cardiovascular;  Laterality: Right;   LOWER EXTREMITY ANGIOGRAPHY Right 12/18/2018   Procedure: Lower Extremity Angiography;  Surgeon: Celso College, MD;  Location: [facility];  Service: Cardiovascular;  Laterality: Right;   LOWER EXTREMITY ANGIOGRAPHY Right 12/18/2018   Procedure: Lower Extremity Angiography;  Surgeon: Celso College, MD;  Location: [facility];  Service: Cardiovascular;  Laterality: Right;   LOWER EXTREMITY ANGIOGRAPHY Left 12/21/2018   Procedure: Lower Extremity Angiography;  Surgeon: Celso College, MD;  Location: [facility];  Service: Cardiovascular;  Laterality: Left;   LOWER EXTREMITY ANGIOGRAPHY Right 12/21/2018   Procedure: Lower Extremity Angiography;  Surgeon: Celso College, MD;  Location: [facility];  Service: Cardiovascular;  Laterality: Right;   LOWER EXTREMITY ANGIOGRAPHY Left 12/25/2020   Procedure: LOWER EXTREMITY ANGIOGRAPHY;  Surgeon: Celso College, MD;  Location: [facility];  Service: Cardiovascular;  Laterality: Left;   LOWER EXTREMITY  ANGIOGRAPHY Left 09/21/2021   Procedure: Lower Extremity Angiography;  Surgeon: Celso College, MD;  Location: [facility];  Service: Cardiovascular;  Laterality: Left;   LOWER EXTREMITY ANGIOGRAPHY Left 10/13/2021   Procedure: Lower Extremity Angiography;  Surgeon: Jackquelyn Mass, MD;  Location: [facility];  Service: Cardiovascular;  Laterality: Left;   LOWER EXTREMITY ANGIOGRAPHY Left 10/14/2021   Procedure: Lower Extremity Angiography;  Surgeon: Celso College, MD;  Location: [facility];  Service: Cardiovascular;  Laterality: Left;   LOWER EXTREMITY ANGIOGRAPHY Left 11/02/2021   Procedure: Lower Extremity Angiography;  Surgeon: Celso College, MD;  Location: [facility];  Service: Cardiovascular;  Laterality: Left;   LOWER EXTREMITY INTERVENTION N/A 12/22/2018   Procedure: LOWER EXTREMITY INTERVENTION;  Surgeon: Celso College, MD;  Location: [facility];  Service: Cardiovascular;  Laterality: N/A;   LOWER EXTREMITY INTERVENTION Left 12/26/2020   Procedure: LOWER EXTREMITY INTERVENTION;  Surgeon: Celso College, MD;  Location: [facility];  Service: Cardiovascular;  Laterality: Left;   LOWER EXTREMITY INTERVENTION N/A 09/22/2021   Procedure: LOWER EXTREMITY INTERVENTION;  Surgeon: Celso College, MD;  Location: [facility];  Service: Cardiovascular;  Laterality: N/A;   Patient Active Problem List  Diagnosis Date Noted   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) 11/04/2022   Amputation above knee (HCC) 11/04/2022   Cellulitis of left lower extremity 02/23/2022   Sepsis due to cellulitis (HCC) 02/23/2022   Dyslipidemia 02/23/2022   Rheumatoid arthritis (HCC) 02/23/2022   Essential hypertension 02/23/2022   Type 2 diabetes mellitus with peripheral neuropathy (HCC) 02/23/2022   Multiple duodenal ulcers    Ischemic leg 12/25/2020   Rheumatoid arthritis of 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)  08/13/2020   Diabetes mellitus without complication (HCC)    GERD (gastroesophageal reflux disease)    COPD (chronic obstructive pulmonary disease) (HCC)    GIB (gastrointestinal bleeding)    HLD (hyperlipidemia)    Depression    Elevated troponin    Syncope    Elevated troponin I level    Anemia of chronic disease 05/16/2019   Dizziness    Polyp of colon    Acute upper GI bleeding 04/13/2019   Anemia associated with acute blood loss 04/13/2019   Chronic anticoagulation 04/13/2019   Hx of right BKA (HCC) 04/13/2019   Symptomatic anemia 04/13/2019   Upper GI bleed 04/13/2019   Acute GI bleeding 04/13/2019   Phantom pain after amputation of lower extremity (HCC) 01/30/2019   Melena    DU (duodenal ulcer)    DKA (diabetic ketoacidosis) (HCC) 12/14/2018   Atherosclerosis of artery of extremity with rest pain (HCC) 08/28/2018   PAD (peripheral artery disease) (HCC) 07/14/2018   Abdominal pain 06/13/2018   HTN (hypertension) 06/13/2018   Non-insulin  dependent type 2 diabetes mellitus (HCC) 06/13/2018   Elevated lactic acid level 06/13/2018    PCP: [facility]  REFERRING PROVIDER: Valene Gash, NP  REFERRING DIAG:  [phone] (ICD-10-CM) - Hx of right BKA (HCC)  Z89.512 (ICD-10-CM) - Hx of BKA, left (HCC)    THERAPY DIAG:  Difficulty in walking, not elsewhere classified  Muscle weakness (generalized)  Imbalance  History of amputation below knee, right (HCC)  History of above knee amputation, left (HCC)  Rationale for Evaluation and Treatment: Rehabilitation  ONSET DATE: L AKA 11/04/22   PERTINENT HISTORY: Pt is a 68 year old female s/p L AKA 11/04/22. Pt has received above-knee prosthesis for L. Pt had R BKA in Sept 2020 with prosthetic rehab in this clinic. Pt is proficient with R below-knee prosthesis.  Patient reports standing with her prosthesis at home with her sister and walking with it some at home. Pt reports dealing with phantom limb  pain. Patient reports getting this in R BKA and L AKA limbs. Patient reports Gabapentin  helps to control phantom limb symptoms. Pt had R BKA 4-5 years ago. Pt reports some tenderness along end of L residual limb. Pt reports that she has not used her prosthesis much over previous 2 weeks. She reports using shrinker each day and completing regular checks on skin integrity for L residual limb.  Type of amputation: Above-knee amputation  Onset: L AKA 11/04/22 Follow-up appt with MD: Pt following up with [facility] for check-up between May and June Pain: 710 Present, 10/10 Worst: (Pain along L hip flexor/L groin) Phantom pain: Yes History of previous amputations/PAD: Yes; Hx of R BKA  Pain quality: pain quality: sharp Numbness/Tingling: Yes Imaging: No    WEIGHT BEARING RESTRICTIONS: No  FALLS:  Has patient fallen in last 6 months? No  LIVING ENVIRONMENT: Lives with: sister, great granddaughter 52 y/o, significant other  Lives in: House/apartment; 3 steps in front  to get into home; temporary living until pt finds home with flat entryway;  Shower chair in bathroom; pt able to get in/out of bathroom Stairs: stairs into basement only, pt does not go there often Has following equipment at home: Otho Blitz - 2 wheeled, Wheelchair (power), Wheelchair (manual), shower chair, and Grab bars; sliding board for W/C transfers  PLOF: Independent with household mobility with device  PATIENT GOALS: Get back to walking and improved independence with L AKA prosthesis  NEXT MD VISIT: Between May-June   OBJECTIVE:  Note: Objective measures were completed at Evaluation unless otherwise noted.  MUSCULOSKELETAL: Tremor: Absent Tone: Normal, no spasticity, rigidity, or clonus Residual limb appears well-healing with no signs of erythema, skin breakdown, infection, or drainage  Posture Forward flexed posture in static standing  Palpation No pain to palpation along distal surface of residual limb.  No  pain with palpation to quadriceps or hamstrings.    PATIENT SURVEYS:  ABC scale 6.3%  COGNITION: Overall cognitive status: Within functional limits for tasks assessed     SENSATION: Not tested   LOWER EXTREMITY ROM:  Active ROM Right eval Left eval  Hip flexion WNL WNL  Hip extension WNL -5  Hip abduction 40 40  Hip adduction    Hip internal rotation    Hip external rotation    Knee flexion    Knee extension    Ankle dorsiflexion    Ankle plantarflexion    Ankle inversion    Ankle eversion     (Blank rows = not tested)  LOWER EXTREMITY MMT:  MMT Right eval Left eval  Hip flexion 4+ 4  Hip extension 4 4  Hip abduction 4+ 4+  Hip adduction    Hip internal rotation    Hip external rotation    Knee flexion 4+   Knee extension 4+   Ankle dorsiflexion    Ankle plantarflexion    Ankle inversion    Ankle eversion     (Blank rows = not tested)   FUNCTIONAL TASKS:  W/C to bed transfer: CGA with sit to stand, pt presents with heavy forward lean and weight shift toward RLE, heavy upper limb push on FWW once pt has hands on walker. Heavy verbal cueing to push from seat versus walker for transferring.  Sit to stand: Difficulty initiating with slowed velocity of first 25% of transfer following lift from mat.   GAIT: Distance walked: 60 ft Assistive device utilized: Walker - 2 wheeled Level of assistance: CGA Comments: Heavy upper limb support on walker. Step-to pattern with RLE leading. Decreased terminal knee extension of L prosthesis at initial contact that can be partially corrected with verbal cueing for swing technique. Decreased LLE weight shift during stance phase. Decreased hip extension during mid-stance to terminal stance.  TREATMENT DATE: 09/01/2023   SUBJECTIVE STATEMENT:   Patient reports continuing to increase her time with  wearing bilateral prostheses. She states that her transportation provider will be changing, and she will require re-certification for Medicaid transport to bring her to appointments again - this may require some time.    Therapeutic Activities - patient education, repetitive task practice for improved performance of daily functional activities e.g. transferring, weight transfer practice to improve static standing activities and weight shift during ambulation  In // bars: Ambulate in parallel bars with bilateral UE assist on bars, reciprocal step-through pattern; x3 DB  -PT CGA Ambulate in parallel bars with unilateral RUE assist on bars, reciprocal step-through pattern; x3 DB  -PT CGA Ambulate in parallel bars with quad cane R and modified step-through pattern, dec step length on R; x 3 D/B  -PT demonstration and verbal cueing for cane placement/sequencing  -PT CGA for balance  Toe tapping on 6-inch step; began with bilateral UE support x 5 reps, then progressed to alternating toe tap with RUE support only x 12 reps R/L   Ambulate in hallway x 2 laps D/B with additional lap in gym with rollator, step-through gait pattern;   -Pt CGA, encouraged step-through pattern with continuous stepping   PATIENT EDUCATION: Discussed progression in AD usage with transition to LRAD as stability and postural control in bilateral prostheses improves. Discussed use of rollator in home and short-distance community trips. Discussed future use of quad cane for shorter trips in home once she demonstrates safer, more-confident gait.    *next visit* Sit to stand with dec UE support, pushing from seat only and attaining upright posture/bilateral stance IND; x 5 repetitions Functional reach with no UE support (intermittent contact with bar if losing balance); 8 cones - stacking and unstacking, placing on adjacent raised table; x 5 minutes Stair task practice   *not today* Forward step up to 6-inch step; x10  with RLE above-knee prosthesis leading Forward step-up; RLE first with LLE trailing; x 8 reps Sit to stand with chair + Airex; task practice to reduce upper limb assistance and controlled descent; x 5 reps Weight shifting with bilateral symmetrical stance and no UE support alternating R/L; x 60 sec   PATIENT EDUCATION:  Education details: see above for patient education details Person educated: Patient Education method: Explanation, Demonstration, and Handouts Education comprehension: verbalized understanding and returned demonstration  HOME EXERCISE PROGRAM: Access Code: [email or web address] URL: [email or web address] Date: 06/23/2023 Prepared by: Denese Finn  Exercises - Prone Hip Flexor Stretch with Towel Roll (AKA)  - 2-3 x daily - 7 x weekly - 1 sets - 2-3 min hold - Supine Hip Abduction  - 2 x daily - 7 x weekly - 2 sets - 10 reps - Sidelying Hip Abduction (AKA)  - 2 x daily - 7 x weekly - 2 sets - 10 reps - Prone Hip Extension with Residual Limb (AKA)  - 2 x daily - 7 x weekly - 2 sets - 10 reps  ASSESSMENT:  CLINICAL IMPRESSION: Pt is making excellent progress with transition to less-restrictive assistive devices. She is able to perform longer-distance gait (> 200 ft) with rollator with reciprocal step-through pattern and no LOB today. She is able to complete trial of quad cane in bars, which may be more appropriate for future use for short-distance trips in her home on even ground/flooring. Pt exhibits improved stability with weight shifting work in // bars with less UE support required.  Patient presents with remaining deficits including:  prosthetic gait deviations, hip weakness, imbalance, and difficulty with weight shifting/transfers. Pt will continue to benefit from skilled PT services to address deficits and improve function.  OBJECTIVE IMPAIRMENTS: Abnormal gait, decreased balance, decreased mobility, difficulty walking, decreased strength, prosthetic dependency ,  and pain.   ACTIVITY LIMITATIONS: carrying, standing, stairs, transfers, bed mobility, and locomotion level  PARTICIPATION LIMITATIONS: cleaning, laundry, shopping, and community activity  PERSONAL FACTORS: Age, Past/current experiences, and 3+ comorbidities: (anxiety, depression, CKD, COPD, DM, DKA, HTN lower extremity ischemia) are also affecting patient's functional outcome.   REHAB POTENTIAL: Good  CLINICAL DECISION MAKING: Evolving/moderate complexity  EVALUATION COMPLEXITY: Moderate   GOALS: Goals reviewed with patient? Yes  SHORT TERM GOALS: Target date: 07/14/23 Patient will be independent and 100% compliant with HEP as needed for carryover of PT intervention Baseline: 06/21/23: Baseline HEP reviewed.  08/04/23: largely compliant with HEP.  Goal status: IN PROGRESS/MOSTLY MET  2.  Patient will attain L hip hyperextension to 15 deg indicative of improved ROM required for terminal stance/heel off during gait Baseline: 06/21/23: L hip extension -5.   08/04/23: 5 deg hyperextension Goal status: IN PROGRESS  3.  Patient will demonstrate modified-independent sit to stand and stand-pivot transfer from W/C to adjacent sitting surface with no LOB and no verbal cueing required from therapist for technique Baseline: 06/21/23: Difficulty and some cueing needed for safety with transferring and stand-pivot transfer.   08/04/23: completed with supervision level of assist  Goal status: IN PROGRESS/MOSTLY MET   LONG TERM GOALS: Target date: 09/01/23  Patient will ambulate at community-level distance with front-wheeled walker ModI with no LOB and normalized hip extension and swing through for L above-knee prosthesis Baseline: 06/21/23: Noted gait deviations and pt ambulating short of household distance with L AKA prosthesis.   08/04/23: Ambulates for home distance with CGA. Limited capacity for community-level distance walking with FWW.  Goal status: IN PROGRESS  2.  Pt will improve ABC Scale by 13%  or greater indicative of improved confidence related to balance and fall risk and clinically meaningful change Baseline: 06/21/23: ABC Scale to be obtained on visit # 2.   06/27/23: 6.3%  08/04/23: 8.8% Goal status: NOT MET  3.  Pt will perform TUG in less than 20 seconds indicative of decreased risk of falls relative to patient-specific population Baseline: 06/21/23: TUG to be completed on visit # 2.   06/27/23: 136 sec.  08/04/23: Deferred to next visit.    08/09/23: 59.1 sec.  Goal status: IN PROGRESS  4.  Pt will improve Amputee Mobility Predictor by 4 points or greater indicative of detectable change in ADLs and functional mobility with prostheses.  Baseline: 06/21/23: AMPPRO to be completed on visits #2-3.  07/06/23: 23/47 08/04/23:  22/47. Goal status: NOT MET   5.  Patient will negotiate 3 steps ModI with FWW as needed for entering/exiting her home. Baseline: 06/21/23: Significant difficulty with stair negotiation.  08/04/23: Completed today with ModA x 2 with heavy upper limb assist.  Goal status: IN PROGRESS    PLAN:  PT FREQUENCY: 2x/week  PT DURATION: 10 weeks  PLANNED INTERVENTIONS: 97164- PT Re-evaluation, 97110-Therapeutic exercises, 97530- Therapeutic activity, [id number]- Neuromuscular re-education, 97535- Self Care, 28413- Prosthetic training, Patient/Family education, and Balance training  PLAN FOR NEXT SESSION: Continue with weight shifting and gait training with new L AKA prosthesis, correcting gait deviations. Progress with lessening upper limb support as able to negotiating various obstacles/terrain with bilat prostheses. Focus on progressive gait training and functional activities; progression to safest  Marcos Sevin, PT, DPT #[id number]  Aleatha Hunting, PT 09/01/2023, 12:42 PM

## 2023-09-01 NOTE — Assessment & Plan Note (Addendum)
 Anemia of chronic disease - RA and CKD, marrow suppression from methotrexate Labs are reviewed and discussed with patient.   Lab Results  Component Value Date   HGB 10.3 (L) 08/22/2023   TIBC 332 08/22/2023   IRONPCTSAT 38 (H) 08/22/2023   FERRITIN 233 08/22/2023    Hemoglobin is stable at her baseline, no iron  deficiency.  Recommend observation.

## 2023-09-06 ENCOUNTER — Encounter (INDEPENDENT_AMBULATORY_CARE_PROVIDER_SITE_OTHER): Payer: Self-pay

## 2023-09-08 ENCOUNTER — Encounter: Admitting: Physical Therapy

## 2023-09-13 ENCOUNTER — Ambulatory Visit

## 2023-09-13 DIAGNOSIS — R262 Difficulty in walking, not elsewhere classified: Secondary | ICD-10-CM | POA: Diagnosis not present

## 2023-09-13 DIAGNOSIS — M6281 Muscle weakness (generalized): Secondary | ICD-10-CM

## 2023-09-13 NOTE — Therapy (Signed)
 OUTPATIENT PHYSICAL THERAPY TREATMENT    Patient Name: Sylvia [name] MRN: [id number] DOB:22-Aug-1955, 68 y.o., female Today's Date: 09/13/2023   END OF SESSION:  PT End of Session - 09/13/23 0943     Visit Number 17    Number of Visits 21    Date for PT Re-Evaluation 09/01/23    Authorization Type [employer]    PT Start Time 0945    PT Stop Time 1030    PT Time Calculation (min) 45 min    Equipment Utilized During Treatment Gait belt   patient's personal FWW   Activity Tolerance Patient limited by fatigue;Patient tolerated treatment well   c/o bilat phantom limb pain   Behavior During Therapy WFL for tasks assessed/performed            Past Medical History:  Diagnosis Date   Anemia of chronic disease 05/16/2019   Anxiety    h/o   Arthritis    Chronic kidney disease    Cocaine abuse (HCC)    COPD (chronic obstructive pulmonary disease) (HCC)    Coronary artery disease    Depression    Diabetes mellitus without complication (HCC)    DKA (diabetic ketoacidosis) (HCC)    Elevated troponin    GERD (gastroesophageal reflux disease)    GI bleed    Hypertension    bp under control-off meds since 2019   Ischemic leg    Past Surgical History:  Procedure Laterality Date   ABDOMINAL HYSTERECTOMY     AMPUTATION Right 12/27/2018   Procedure: AMPUTATION BELOW KNEE;  Surgeon: Celso College, MD;  Location: [facility];  Service: General;  Laterality: Right;   AMPUTATION Left 11/05/2021   Procedure: AMPUTATION BELOW KNEE;  Surgeon: Celso College, MD;  Location: [facility];  Service: Vascular;  Laterality: Left;   AMPUTATION Left 11/04/2022   Procedure: AMPUTATION ABOVE KNEE;  Surgeon: Celso College, MD;  Location: [facility];  Service: Vascular;  Laterality: Left;   APPLICATION OF WOUND VAC  10/16/2021   Procedure: APPLICATION OF WOUND VAC;  Surgeon: Celso College, MD;  Location: [facility];  Service: Vascular;;   CATARACT EXTRACTION W/PHACO Right 03/27/2020   Procedure: CATARACT  EXTRACTION PHACO AND INTRAOCULAR LENS PLACEMENT (IOC) RIGHT DIABETIC 7.54 00:52.5;  Surgeon: Clair Crews, MD;  Location: [facility];  Service: Ophthalmology;  Laterality: Right;   CATARACT EXTRACTION W/PHACO Left 05/20/2020   Procedure: CATARACT EXTRACTION PHACO AND INTRAOCULAR LENS PLACEMENT (IOC) LEFT DIABETIC 4.95 00:37.6;  Surgeon: Clair Crews, MD;  Location: [facility];  Service: Ophthalmology;  Laterality: Left;  Diabetic - oral meds COVID + 04-24-20   CHOLECYSTECTOMY     COLONOSCOPY WITH PROPOFOL  N/A 04/16/2019   Procedure: COLONOSCOPY WITH PROPOFOL ;  Surgeon: Irby Mannan, MD;  Location: [facility];  Service: Endoscopy;  Laterality: N/A;   COLONOSCOPY WITH PROPOFOL  N/A 01/16/2020   Procedure: COLONOSCOPY WITH PROPOFOL ;  Surgeon: Irby Mannan, MD;  Location: [facility];  Service: Endoscopy;  Laterality: N/A;   ENDARTERECTOMY FEMORAL Left 10/16/2021   Procedure: ENDARTERECTOMY FEMORAL;  Surgeon: Celso College, MD;  Location: [facility];  Service: Vascular;  Laterality: Left;   ESOPHAGOGASTRODUODENOSCOPY (EGD) WITH PROPOFOL  N/A 06/15/2018   Procedure: ESOPHAGOGASTRODUODENOSCOPY (EGD) WITH PROPOFOL ;  Surgeon: Toledo, Alphonsus Jeans, MD;  Location: [facility];  Service: Gastroenterology;  Laterality: N/A;   ESOPHAGOGASTRODUODENOSCOPY (EGD) WITH PROPOFOL  N/A 01/03/2019   Procedure: ESOPHAGOGASTRODUODENOSCOPY (EGD) WITH PROPOFOL ;  Surgeon: Marnee Sink, MD;  Location: [facility];  Service: Endoscopy;  Laterality: N/A;  ESOPHAGOGASTRODUODENOSCOPY (EGD) WITH PROPOFOL  N/A 09/25/2021   Procedure: ESOPHAGOGASTRODUODENOSCOPY (EGD) WITH PROPOFOL ;  Surgeon: Marnee Sink, MD;  Location: [facility];  Service: Endoscopy;  Laterality: N/A;   EYE SURGERY Bilateral    GIVENS CAPSULE STUDY  04/16/2019   Procedure: GIVENS CAPSULE STUDY;  Surgeon: Irby Mannan, MD;  Location: [facility];  Service: Endoscopy;;   GIVENS CAPSULE STUDY N/A  06/25/2019   Procedure: GIVENS CAPSULE STUDY;  Surgeon: Luke Salaam, MD;  Location: [facility];  Service: Gastroenterology;  Laterality: N/A;   INCISION AND DRAINAGE OF WOUND Left 02/24/2022   Procedure: LEFT BKA IRRIGATION AND DEBRIDEMENT WOUND;  Surgeon: Celso College, MD;  Location: [facility];  Service: Vascular;  Laterality: Left;   LOWER EXTREMITY ANGIOGRAPHY Right 08/21/2018   Procedure: LOWER EXTREMITY ANGIOGRAPHY;  Surgeon: Celso College, MD;  Location: [facility];  Service: Cardiovascular;  Laterality: Right;   LOWER EXTREMITY ANGIOGRAPHY Left 08/28/2018   Procedure: LOWER EXTREMITY ANGIOGRAPHY;  Surgeon: Celso College, MD;  Location: [facility];  Service: Cardiovascular;  Laterality: Left;   LOWER EXTREMITY ANGIOGRAPHY Right 08/28/2018   Procedure: Lower Extremity Angiography;  Surgeon: Celso College, MD;  Location: [facility];  Service: Cardiovascular;  Laterality: Right;   LOWER EXTREMITY ANGIOGRAPHY Right 12/18/2018   Procedure: Lower Extremity Angiography;  Surgeon: Celso College, MD;  Location: [facility];  Service: Cardiovascular;  Laterality: Right;   LOWER EXTREMITY ANGIOGRAPHY Right 12/18/2018   Procedure: Lower Extremity Angiography;  Surgeon: Celso College, MD;  Location: [facility];  Service: Cardiovascular;  Laterality: Right;   LOWER EXTREMITY ANGIOGRAPHY Left 12/21/2018   Procedure: Lower Extremity Angiography;  Surgeon: Celso College, MD;  Location: [facility];  Service: Cardiovascular;  Laterality: Left;   LOWER EXTREMITY ANGIOGRAPHY Right 12/21/2018   Procedure: Lower Extremity Angiography;  Surgeon: Celso College, MD;  Location: [facility];  Service: Cardiovascular;  Laterality: Right;   LOWER EXTREMITY ANGIOGRAPHY Left 12/25/2020   Procedure: LOWER EXTREMITY ANGIOGRAPHY;  Surgeon: Celso College, MD;  Location: [facility];  Service: Cardiovascular;  Laterality: Left;   LOWER EXTREMITY  ANGIOGRAPHY Left 09/21/2021   Procedure: Lower Extremity Angiography;  Surgeon: Celso College, MD;  Location: [facility];  Service: Cardiovascular;  Laterality: Left;   LOWER EXTREMITY ANGIOGRAPHY Left 10/13/2021   Procedure: Lower Extremity Angiography;  Surgeon: Jackquelyn Mass, MD;  Location: [facility];  Service: Cardiovascular;  Laterality: Left;   LOWER EXTREMITY ANGIOGRAPHY Left 10/14/2021   Procedure: Lower Extremity Angiography;  Surgeon: Celso College, MD;  Location: [facility];  Service: Cardiovascular;  Laterality: Left;   LOWER EXTREMITY ANGIOGRAPHY Left 11/02/2021   Procedure: Lower Extremity Angiography;  Surgeon: Celso College, MD;  Location: [facility];  Service: Cardiovascular;  Laterality: Left;   LOWER EXTREMITY INTERVENTION N/A 12/22/2018   Procedure: LOWER EXTREMITY INTERVENTION;  Surgeon: Celso College, MD;  Location: [facility];  Service: Cardiovascular;  Laterality: N/A;   LOWER EXTREMITY INTERVENTION Left 12/26/2020   Procedure: LOWER EXTREMITY INTERVENTION;  Surgeon: Celso College, MD;  Location: [facility];  Service: Cardiovascular;  Laterality: Left;   LOWER EXTREMITY INTERVENTION N/A 09/22/2021   Procedure: LOWER EXTREMITY INTERVENTION;  Surgeon: Celso College, MD;  Location: [facility];  Service: Cardiovascular;  Laterality: N/A;   Patient Active Problem List   Diagnosis Date Noted  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) 11/04/2022   Amputation above knee (HCC) 11/04/2022   Cellulitis of left lower extremity 02/23/2022   Sepsis due to cellulitis (HCC) 02/23/2022   Dyslipidemia 02/23/2022   Rheumatoid arthritis (HCC) 02/23/2022   Essential hypertension 02/23/2022   Type 2 diabetes mellitus with peripheral neuropathy (HCC) 02/23/2022   Multiple duodenal ulcers    Ischemic leg 12/25/2020   Rheumatoid arthritis of 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)  08/13/2020   Diabetes mellitus without complication (HCC)    GERD (gastroesophageal reflux disease)    COPD (chronic obstructive pulmonary disease) (HCC)    GIB (gastrointestinal bleeding)    HLD (hyperlipidemia)    Depression    Elevated troponin    Syncope    Elevated troponin I level    Anemia of chronic disease 05/16/2019   Dizziness    Polyp of colon    Acute upper GI bleeding 04/13/2019   Anemia associated with acute blood loss 04/13/2019   Chronic anticoagulation 04/13/2019   Hx of right BKA (HCC) 04/13/2019   Symptomatic anemia 04/13/2019   Upper GI bleed 04/13/2019   Acute GI bleeding 04/13/2019   Phantom pain after amputation of lower extremity (HCC) 01/30/2019   Melena    DU (duodenal ulcer)    DKA (diabetic ketoacidosis) (HCC) 12/14/2018   Atherosclerosis of artery of extremity with rest pain (HCC) 08/28/2018   PAD (peripheral artery disease) (HCC) 07/14/2018   Abdominal pain 06/13/2018   HTN (hypertension) 06/13/2018   Non-insulin  dependent type 2 diabetes mellitus (HCC) 06/13/2018   Elevated lactic acid level 06/13/2018    PCP: Stephenie Einstein [facility]  REFERRING PROVIDER: Valene Gash, NP  REFERRING DIAG:  [phone] (ICD-10-CM) - Hx of right BKA (HCC)  Z89.512 (ICD-10-CM) - Hx of BKA, left (HCC)    THERAPY DIAG:  Difficulty in walking, not elsewhere classified  Muscle weakness (generalized)  Rationale for Evaluation and Treatment: Rehabilitation  ONSET DATE: L AKA 11/04/22   PERTINENT HISTORY: Pt is a 68 year old female s/p L AKA 11/04/22. Pt has received above-knee prosthesis for L. Pt had R BKA in Sept 2020 with prosthetic rehab in this clinic. Pt is proficient with R below-knee prosthesis.  Patient reports standing with her prosthesis at home with her sister and walking with it some at home. Pt reports dealing with phantom limb pain. Patient reports getting this in R BKA and L AKA limbs. Patient reports Gabapentin  helps to control  phantom limb symptoms. Pt had R BKA 4-5 years ago. Pt reports some tenderness along end of L residual limb. Pt reports that she has not used her prosthesis much over previous 2 weeks. She reports using shrinker each day and completing regular checks on skin integrity for L residual limb.  Type of amputation: Above-knee amputation  Onset: L AKA 11/04/22 Follow-up appt with MD: Pt following up with [facility] for check-up between May and June Pain: 710 Present, 10/10 Worst: (Pain along L hip flexor/L groin) Phantom pain: Yes History of previous amputations/PAD: Yes; Hx of R BKA  Pain quality: pain quality: sharp Numbness/Tingling: Yes Imaging: No    WEIGHT BEARING RESTRICTIONS: No  FALLS:  Has patient fallen in last 6 months? No  LIVING ENVIRONMENT: Lives with: sister, great granddaughter 56 y/o, significant other  Lives in: House/apartment; 3 steps in front to get into home; temporary living until pt finds home with flat entryway;  Shower chair in bathroom; pt able to get in/out  of bathroom Stairs: stairs into basement only, pt does not go there often Has following equipment at home: Otho Blitz - 2 wheeled, Wheelchair (power), Wheelchair (manual), shower chair, and Grab bars; sliding board for W/C transfers  PLOF: Independent with household mobility with device  PATIENT GOALS: Get back to walking and improved independence with L AKA prosthesis  NEXT MD VISIT: Between May-June   OBJECTIVE:  Note: Objective measures were completed at Evaluation unless otherwise noted.  MUSCULOSKELETAL: Tremor: Absent Tone: Normal, no spasticity, rigidity, or clonus Residual limb appears well-healing with no signs of erythema, skin breakdown, infection, or drainage  Posture Forward flexed posture in static standing  Palpation No pain to palpation along distal surface of residual limb.  No pain with palpation to quadriceps or hamstrings.    PATIENT SURVEYS:  ABC scale  6.3%  COGNITION: Overall cognitive status: Within functional limits for tasks assessed     SENSATION: Not tested   LOWER EXTREMITY ROM:  Active ROM Right eval Left eval  Hip flexion WNL WNL  Hip extension WNL -5  Hip abduction 40 40  Hip adduction    Hip internal rotation    Hip external rotation    Knee flexion    Knee extension    Ankle dorsiflexion    Ankle plantarflexion    Ankle inversion    Ankle eversion     (Blank rows = not tested)  LOWER EXTREMITY MMT:  MMT Right eval Left eval  Hip flexion 4+ 4  Hip extension 4 4  Hip abduction 4+ 4+  Hip adduction    Hip internal rotation    Hip external rotation    Knee flexion 4+   Knee extension 4+   Ankle dorsiflexion    Ankle plantarflexion    Ankle inversion    Ankle eversion     (Blank rows = not tested)   FUNCTIONAL TASKS:  W/C to bed transfer: CGA with sit to stand, pt presents with heavy forward lean and weight shift toward RLE, heavy upper limb push on FWW once pt has hands on walker. Heavy verbal cueing to push from seat versus walker for transferring.  Sit to stand: Difficulty initiating with slowed velocity of first 25% of transfer following lift from mat.   GAIT: Distance walked: 60 ft Assistive device utilized: Walker - 2 wheeled Level of assistance: CGA Comments: Heavy upper limb support on walker. Step-to pattern with RLE leading. Decreased terminal knee extension of L prosthesis at initial contact that can be partially corrected with verbal cueing for swing technique. Decreased LLE weight shift during stance phase. Decreased hip extension during mid-stance to terminal stance.                                                                                                                                 TREATMENT DATE: 09/13/2023   SUBJECTIVE STATEMENT:   Patient reports continuing to increase her time with wearing bilateral prostheses  at home. She is using the wheelchair less at home and  ambulating with her rolling walker. 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   Therapeutic Activities - patient education, repetitive task practice for improved performance of daily functional activities e.g. transferring, weight transfer practice to improve static standing activities and weight shift during ambulation  In // bars: Ambulation in parallel bars with bilateral UE assist on bars and CGA, reciprocal step-through pattern, cues to gradually increase R step length x multiple lengths; Ambulation in parallel bars with unilateral RUE assist on bars, reciprocal step-through pattern x multiple lengths, practiced R turns with RUE support only x multiple lengths; Ambulation in parallel bars with quad cane R and three point partial step-through pattern, dec step length on R x multiple lengths; Ambulation in hallway x approximately 200' laps with rollator, step-through gait pattern, practiced turns at both ends of the hallway.  Attempted sit to stands with hands on knees and Airex pad on seat however pt is not strong enough to perform;    PATIENT EDUCATION: Discussed progression in AD usage with transition to LRAD as stability and postural control in bilateral prostheses improves. Discussed use of rollator in home and short-distance community trips. Discussed future use of quad cane for shorter trips in home once she demonstrates safer, more-confident gait.    *next visit* Sit to stand with dec UE support, pushing from seat only and attaining upright posture/bilateral stance IND; x 5 repetitions Functional reach with no UE support (intermittent contact with bar if losing balance); 8 cones - stacking and unstacking, placing on adjacent raised table; x 5 minutes Stair task practice Forward step up to 6-inch step; x10 with RLE above-knee prosthesis leading Forward step-up; RLE first with LLE trailing; x 8 reps Sit to stand  with chair + Airex; task practice to reduce upper limb assistance and controlled descent; x 5 reps Weight shifting with bilateral symmetrical stance and no UE support alternating R/L; x 60 sec   PATIENT EDUCATION:  Education details: see above for patient education details Person educated: Patient Education method: Explanation, Demonstration, and Handouts Education comprehension: verbalized understanding and returned demonstration  HOME EXERCISE PROGRAM: Access Code: [email or web address] URL: [email or web address] Date: 06/23/2023 Prepared by: Denese Finn  Exercises - Prone Hip Flexor Stretch with Towel Roll (AKA)  - 2-3 x daily - 7 x weekly - 1 sets - 2-3 min hold - Supine Hip Abduction  - 2 x daily - 7 x weekly - 2 sets - 10 reps - Sidelying Hip Abduction (AKA)  - 2 x daily - 7 x weekly - 2 sets - 10 reps - Prone Hip Extension with Residual Limb (AKA)  - 2 x daily - 7 x weekly - 2 sets - 10 reps  ASSESSMENT:  CLINICAL IMPRESSION: Pt is making excellent progress with transition to less-restrictive assistive devices. She is once again able to perform longer-distance gait (> 200 ft) with rollator however does report considerable fatigue afterwards. She requires intermittent seated rest breaks after ambulation practice in the parallel bars and is once again able to utilize the quad cane. Also practiced turning today with single UE support on both bars and QC. Patient presents with remaining deficits including: prosthetic gait deviations, hip weakness, imbalance, and difficulty with weight shifting/transfers. Pt will continue to benefit from skilled PT services to address deficits and improve function.  OBJECTIVE IMPAIRMENTS: Abnormal gait, decreased balance, decreased mobility, difficulty walking, decreased strength, prosthetic  dependency , and pain.   ACTIVITY LIMITATIONS: carrying, standing, stairs, transfers, bed mobility, and locomotion level  PARTICIPATION LIMITATIONS:  cleaning, laundry, shopping, and community activity  PERSONAL FACTORS: Age, Past/current experiences, and 3+ comorbidities: (anxiety, depression, CKD, COPD, DM, DKA, HTN lower extremity ischemia) are also affecting patient's functional outcome.   REHAB POTENTIAL: Good  CLINICAL DECISION MAKING: Evolving/moderate complexity  EVALUATION COMPLEXITY: Moderate   GOALS: Goals reviewed with patient? Yes  SHORT TERM GOALS: Target date: 07/14/23 Patient will be independent and 100% compliant with HEP as needed for carryover of PT intervention Baseline: 06/21/23: Baseline HEP reviewed.  08/04/23: largely compliant with HEP.  Goal status: IN PROGRESS/MOSTLY MET  2.  Patient will attain L hip hyperextension to 15 deg indicative of improved ROM required for terminal stance/heel off during gait Baseline: 06/21/23: L hip extension -5.   08/04/23: 5 deg hyperextension Goal status: IN PROGRESS  3.  Patient will demonstrate modified-independent sit to stand and stand-pivot transfer from W/C to adjacent sitting surface with no LOB and no verbal cueing required from therapist for technique Baseline: 06/21/23: Difficulty and some cueing needed for safety with transferring and stand-pivot transfer.   08/04/23: completed with supervision level of assist  Goal status: IN PROGRESS/MOSTLY MET   LONG TERM GOALS: Target date: 09/01/23  Patient will ambulate at community-level distance with front-wheeled walker ModI with no LOB and normalized hip extension and swing through for L above-knee prosthesis Baseline: 06/21/23: Noted gait deviations and pt ambulating short of household distance with L AKA prosthesis.   08/04/23: Ambulates for home distance with CGA. Limited capacity for community-level distance walking with FWW.  Goal status: IN PROGRESS  2.  Pt will improve ABC Scale by 13% or greater indicative of improved confidence related to balance and fall risk and clinically meaningful change Baseline: 06/21/23: ABC Scale  to be obtained on visit # 2.   06/27/23: 6.3%  08/04/23: 8.8% Goal status: NOT MET  3.  Pt will perform TUG in less than 20 seconds indicative of decreased risk of falls relative to patient-specific population Baseline: 06/21/23: TUG to be completed on visit # 2.   06/27/23: 136 sec.  08/04/23: Deferred to next visit.    08/09/23: 59.1 sec.  Goal status: IN PROGRESS  4.  Pt will improve Amputee Mobility Predictor by 4 points or greater indicative of detectable change in ADLs and functional mobility with prostheses.  Baseline: 06/21/23: AMPPRO to be completed on visits #2-3.  07/06/23: 23/47 08/04/23:  22/47. Goal status: NOT MET   5.  Patient will negotiate 3 steps ModI with FWW as needed for entering/exiting her home. Baseline: 06/21/23: Significant difficulty with stair negotiation.  08/04/23: Completed today with ModA x 2 with heavy upper limb assist.  Goal status: IN PROGRESS    PLAN:  PT FREQUENCY: 2x/week  PT DURATION: 10 weeks  PLANNED INTERVENTIONS: 97164- PT Re-evaluation, 97110-Therapeutic exercises, 97530- Therapeutic activity, [id number]- Neuromuscular re-education, 97535- Self Care, 16109- Prosthetic training, Patient/Family education, and Balance training  PLAN FOR NEXT SESSION: Continue with weight shifting and gait training with new L AKA prosthesis, correcting gait deviations. Progress with lessening upper limb support as able to negotiating various obstacles/terrain with bilat prostheses. Focus on progressive gait training and functional activities; progression to safest LRAD.    Miyuki Rzasa D Khaliya Golinski PT, DPT, GCS  Adarius Tigges, PT 09/13/2023, 10:57 AM

## 2023-09-13 NOTE — Addendum Note (Signed)
 Addended by: Denese Finn T on: 09/13/2023 12:16 PM   Modules accepted: Orders

## 2023-09-15 ENCOUNTER — Ambulatory Visit: Admitting: Physical Therapy

## 2023-09-15 DIAGNOSIS — M6281 Muscle weakness (generalized): Secondary | ICD-10-CM

## 2023-09-15 DIAGNOSIS — R262 Difficulty in walking, not elsewhere classified: Secondary | ICD-10-CM | POA: Diagnosis not present

## 2023-09-15 DIAGNOSIS — R2689 Other abnormalities of gait and mobility: Secondary | ICD-10-CM

## 2023-09-15 DIAGNOSIS — Z89511 Acquired absence of right leg below knee: Secondary | ICD-10-CM

## 2023-09-15 DIAGNOSIS — Z89612 Acquired absence of left leg above knee: Secondary | ICD-10-CM

## 2023-09-15 NOTE — Therapy (Signed)
 OUTPATIENT PHYSICAL THERAPY TREATMENT    Patient Name: Sylvia [name] MRN: [id number] DOB:11-07-1955, 68 y.o., female Today's Date: 09/15/2023   END OF SESSION:  PT End of Session - 09/15/23 1334     Visit Number 18    Number of Visits 21    Authorization Type [employer]    PT Start Time 1348    PT Stop Time 1431    PT Time Calculation (min) 43 min    Equipment Utilized During Treatment Gait belt   patient's personal FWW   Activity Tolerance Patient limited by fatigue;Patient tolerated treatment well   c/o bilat phantom limb pain   Behavior During Therapy WFL for tasks assessed/performed             Past Medical History:  Diagnosis Date   Anemia of chronic disease 05/16/2019   Anxiety    h/o   Arthritis    Chronic kidney disease    Cocaine abuse (HCC)    COPD (chronic obstructive pulmonary disease) (HCC)    Coronary artery disease    Depression    Diabetes mellitus without complication (HCC)    DKA (diabetic ketoacidosis) (HCC)    Elevated troponin    GERD (gastroesophageal reflux disease)    GI bleed    Hypertension    bp under control-off meds since 2019   Ischemic leg    Past Surgical History:  Procedure Laterality Date   ABDOMINAL HYSTERECTOMY     AMPUTATION Right 12/27/2018   Procedure: AMPUTATION BELOW KNEE;  Surgeon: Celso College, MD;  Location: [facility];  Service: General;  Laterality: Right;   AMPUTATION Left 11/05/2021   Procedure: AMPUTATION BELOW KNEE;  Surgeon: Celso College, MD;  Location: [facility];  Service: Vascular;  Laterality: Left;   AMPUTATION Left 11/04/2022   Procedure: AMPUTATION ABOVE KNEE;  Surgeon: Celso College, MD;  Location: [facility];  Service: Vascular;  Laterality: Left;   APPLICATION OF WOUND VAC  10/16/2021   Procedure: APPLICATION OF WOUND VAC;  Surgeon: Celso College, MD;  Location: [facility];  Service: Vascular;;   CATARACT EXTRACTION W/PHACO Right 03/27/2020   Procedure: CATARACT EXTRACTION PHACO AND INTRAOCULAR LENS  PLACEMENT (IOC) RIGHT DIABETIC 7.54 00:52.5;  Surgeon: Clair Crews, MD;  Location: [facility];  Service: Ophthalmology;  Laterality: Right;   CATARACT EXTRACTION W/PHACO Left 05/20/2020   Procedure: CATARACT EXTRACTION PHACO AND INTRAOCULAR LENS PLACEMENT (IOC) LEFT DIABETIC 4.95 00:37.6;  Surgeon: Clair Crews, MD;  Location: [facility];  Service: Ophthalmology;  Laterality: Left;  Diabetic - oral meds COVID + 04-24-20   CHOLECYSTECTOMY     COLONOSCOPY WITH PROPOFOL  N/A 04/16/2019   Procedure: COLONOSCOPY WITH PROPOFOL ;  Surgeon: Irby Mannan, MD;  Location: [facility];  Service: Endoscopy;  Laterality: N/A;   COLONOSCOPY WITH PROPOFOL  N/A 01/16/2020   Procedure: COLONOSCOPY WITH PROPOFOL ;  Surgeon: Irby Mannan, MD;  Location: [facility];  Service: Endoscopy;  Laterality: N/A;   ENDARTERECTOMY FEMORAL Left 10/16/2021   Procedure: ENDARTERECTOMY FEMORAL;  Surgeon: Celso College, MD;  Location: [facility];  Service: Vascular;  Laterality: Left;   ESOPHAGOGASTRODUODENOSCOPY (EGD) WITH PROPOFOL  N/A 06/15/2018   Procedure: ESOPHAGOGASTRODUODENOSCOPY (EGD) WITH PROPOFOL ;  Surgeon: Toledo, Alphonsus Jeans, MD;  Location: [facility];  Service: Gastroenterology;  Laterality: N/A;   ESOPHAGOGASTRODUODENOSCOPY (EGD) WITH PROPOFOL  N/A 01/03/2019   Procedure: ESOPHAGOGASTRODUODENOSCOPY (EGD) WITH PROPOFOL ;  Surgeon: Marnee Sink, MD;  Location: [facility];  Service: Endoscopy;  Laterality: N/A;   ESOPHAGOGASTRODUODENOSCOPY (EGD) WITH PROPOFOL  N/A  09/25/2021   Procedure: ESOPHAGOGASTRODUODENOSCOPY (EGD) WITH PROPOFOL ;  Surgeon: Marnee Sink, MD;  Location: [facility];  Service: Endoscopy;  Laterality: N/A;   EYE SURGERY Bilateral    GIVENS CAPSULE STUDY  04/16/2019   Procedure: GIVENS CAPSULE STUDY;  Surgeon: Irby Mannan, MD;  Location: [facility];  Service: Endoscopy;;   GIVENS CAPSULE STUDY N/A 06/25/2019   Procedure: GIVENS CAPSULE STUDY;   Surgeon: Luke Salaam, MD;  Location: [facility];  Service: Gastroenterology;  Laterality: N/A;   INCISION AND DRAINAGE OF WOUND Left 02/24/2022   Procedure: LEFT BKA IRRIGATION AND DEBRIDEMENT WOUND;  Surgeon: Celso College, MD;  Location: [facility];  Service: Vascular;  Laterality: Left;   LOWER EXTREMITY ANGIOGRAPHY Right 08/21/2018   Procedure: LOWER EXTREMITY ANGIOGRAPHY;  Surgeon: Celso College, MD;  Location: [facility];  Service: Cardiovascular;  Laterality: Right;   LOWER EXTREMITY ANGIOGRAPHY Left 08/28/2018   Procedure: LOWER EXTREMITY ANGIOGRAPHY;  Surgeon: Celso College, MD;  Location: [facility];  Service: Cardiovascular;  Laterality: Left;   LOWER EXTREMITY ANGIOGRAPHY Right 08/28/2018   Procedure: Lower Extremity Angiography;  Surgeon: Celso College, MD;  Location: [facility];  Service: Cardiovascular;  Laterality: Right;   LOWER EXTREMITY ANGIOGRAPHY Right 12/18/2018   Procedure: Lower Extremity Angiography;  Surgeon: Celso College, MD;  Location: [facility];  Service: Cardiovascular;  Laterality: Right;   LOWER EXTREMITY ANGIOGRAPHY Right 12/18/2018   Procedure: Lower Extremity Angiography;  Surgeon: Celso College, MD;  Location: [facility];  Service: Cardiovascular;  Laterality: Right;   LOWER EXTREMITY ANGIOGRAPHY Left 12/21/2018   Procedure: Lower Extremity Angiography;  Surgeon: Celso College, MD;  Location: [facility];  Service: Cardiovascular;  Laterality: Left;   LOWER EXTREMITY ANGIOGRAPHY Right 12/21/2018   Procedure: Lower Extremity Angiography;  Surgeon: Celso College, MD;  Location: [facility];  Service: Cardiovascular;  Laterality: Right;   LOWER EXTREMITY ANGIOGRAPHY Left 12/25/2020   Procedure: LOWER EXTREMITY ANGIOGRAPHY;  Surgeon: Celso College, MD;  Location: [facility];  Service: Cardiovascular;  Laterality: Left;   LOWER EXTREMITY ANGIOGRAPHY Left 09/21/2021   Procedure: Lower Extremity  Angiography;  Surgeon: Celso College, MD;  Location: [facility];  Service: Cardiovascular;  Laterality: Left;   LOWER EXTREMITY ANGIOGRAPHY Left 10/13/2021   Procedure: Lower Extremity Angiography;  Surgeon: Jackquelyn Mass, MD;  Location: [facility];  Service: Cardiovascular;  Laterality: Left;   LOWER EXTREMITY ANGIOGRAPHY Left 10/14/2021   Procedure: Lower Extremity Angiography;  Surgeon: Celso College, MD;  Location: [facility];  Service: Cardiovascular;  Laterality: Left;   LOWER EXTREMITY ANGIOGRAPHY Left 11/02/2021   Procedure: Lower Extremity Angiography;  Surgeon: Celso College, MD;  Location: [facility];  Service: Cardiovascular;  Laterality: Left;   LOWER EXTREMITY INTERVENTION N/A 12/22/2018   Procedure: LOWER EXTREMITY INTERVENTION;  Surgeon: Celso College, MD;  Location: [facility];  Service: Cardiovascular;  Laterality: N/A;   LOWER EXTREMITY INTERVENTION Left 12/26/2020   Procedure: LOWER EXTREMITY INTERVENTION;  Surgeon: Celso College, MD;  Location: [facility];  Service: Cardiovascular;  Laterality: Left;   LOWER EXTREMITY INTERVENTION N/A 09/22/2021   Procedure: LOWER EXTREMITY INTERVENTION;  Surgeon: Celso College, MD;  Location: [facility];  Service: Cardiovascular;  Laterality: N/A;   Patient Active Problem List   Diagnosis Date Noted   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) 11/04/2022   Amputation above knee (HCC) 11/04/2022   Cellulitis of left lower extremity 02/23/2022   Sepsis due to cellulitis (HCC) 02/23/2022   Dyslipidemia 02/23/2022   Rheumatoid arthritis (HCC) 02/23/2022   Essential hypertension 02/23/2022   Type 2 diabetes mellitus with peripheral neuropathy (HCC) 02/23/2022   Multiple duodenal ulcers    Ischemic leg 12/25/2020   Rheumatoid arthritis of 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) 08/13/2020   Diabetes mellitus without complication (HCC)     GERD (gastroesophageal reflux disease)    COPD (chronic obstructive pulmonary disease) (HCC)    GIB (gastrointestinal bleeding)    HLD (hyperlipidemia)    Depression    Elevated troponin    Syncope    Elevated troponin I level    Anemia of chronic disease 05/16/2019   Dizziness    Polyp of colon    Acute upper GI bleeding 04/13/2019   Anemia associated with acute blood loss 04/13/2019   Chronic anticoagulation 04/13/2019   Hx of right BKA (HCC) 04/13/2019   Symptomatic anemia 04/13/2019   Upper GI bleed 04/13/2019   Acute GI bleeding 04/13/2019   Phantom pain after amputation of lower extremity (HCC) 01/30/2019   Melena    DU (duodenal ulcer)    DKA (diabetic ketoacidosis) (HCC) 12/14/2018   Atherosclerosis of artery of extremity with rest pain (HCC) 08/28/2018   PAD (peripheral artery disease) (HCC) 07/14/2018   Abdominal pain 06/13/2018   HTN (hypertension) 06/13/2018   Non-insulin  dependent type 2 diabetes mellitus (HCC) 06/13/2018   Elevated lactic acid level 06/13/2018    [facility]P: [facility]  REFERRING PROVIDER: Valene Gash, NP  REFERRING DIAG:  [phone] (ICD-10-CM) - Hx of right BKA (HCC)  Z89.512 (ICD-10-CM) - Hx of BKA, left (HCC)    THERAPY DIAG:  Difficulty in walking, not elsewhere classified  Muscle weakness (generalized)  Imbalance  History of amputation below knee, right (HCC)  History of above knee amputation, left (HCC)  Rationale for Evaluation and Treatment: Rehabilitation  ONSET DATE: L AKA 11/04/22   PERTINENT HISTORY: Pt is a 68 year old female s/p L AKA 11/04/22. Pt has received above-knee prosthesis for L. Pt had R BKA in Sept 2020 with prosthetic rehab in this clinic. Pt is proficient with R below-knee prosthesis.  Patient reports standing with her prosthesis at home with her sister and walking with it some at home. Pt reports dealing with phantom limb pain. Patient reports getting this in R BKA and L AKA limbs.  Patient reports Gabapentin  helps to control phantom limb symptoms. Pt had R BKA 4-5 years ago. Pt reports some tenderness along end of L residual limb. Pt reports that she has not used her prosthesis much over previous 2 weeks. She reports using shrinker each day and completing regular checks on skin integrity for L residual limb.  Type of amputation: Above-knee amputation  Onset: L AKA 11/04/22 Follow-up appt with MD: Pt following up with [facility] for check-up between May and June Pain: 710 Present, 10/10 Worst: (Pain along L hip flexor/L groin) Phantom pain: Yes History of previous amputations/PAD: Yes; Hx of R BKA  Pain quality: pain quality: sharp Numbness/Tingling: Yes Imaging: No    WEIGHT BEARING RESTRICTIONS: No  FALLS:  Has patient fallen in last 6 months? No  LIVING ENVIRONMENT: Lives with: sister, great granddaughter 10 y/o, significant other  Lives in: House/apartment; 3 steps in front to get into home; temporary living until pt  finds home with flat entryway;  Shower chair in bathroom; pt able to get in/out of bathroom Stairs: stairs into basement only, pt does not go there often Has following equipment at home: Otho Blitz - 2 wheeled, Wheelchair (power), Wheelchair (manual), shower chair, and Grab bars; sliding board for W/C transfers  PLOF: Independent with household mobility with device  PATIENT GOALS: Get back to walking and improved independence with L AKA prosthesis  NEXT MD VISIT: Between May-June   OBJECTIVE:  Note: Objective measures were completed at Evaluation unless otherwise noted.  MUSCULOSKELETAL: Tremor: Absent Tone: Normal, no spasticity, rigidity, or clonus Residual limb appears well-healing with no signs of erythema, skin breakdown, infection, or drainage  Posture Forward flexed posture in static standing  Palpation No pain to palpation along distal surface of residual limb.  No pain with palpation to quadriceps or hamstrings.    PATIENT  SURVEYS:  ABC scale 6.3%  COGNITION: Overall cognitive status: Within functional limits for tasks assessed     SENSATION: Not tested   LOWER EXTREMITY ROM:  Active ROM Right eval Left eval  Hip flexion WNL WNL  Hip extension WNL -5  Hip abduction 40 40  Hip adduction    Hip internal rotation    Hip external rotation    Knee flexion    Knee extension    Ankle dorsiflexion    Ankle plantarflexion    Ankle inversion    Ankle eversion     (Blank rows = not tested)  LOWER EXTREMITY MMT:  MMT Right eval Left eval  Hip flexion 4+ 4  Hip extension 4 4  Hip abduction 4+ 4+  Hip adduction    Hip internal rotation    Hip external rotation    Knee flexion 4+   Knee extension 4+   Ankle dorsiflexion    Ankle plantarflexion    Ankle inversion    Ankle eversion     (Blank rows = not tested)   FUNCTIONAL TASKS:  W/C to bed transfer: CGA with sit to stand, pt presents with heavy forward lean and weight shift toward RLE, heavy upper limb push on FWW once pt has hands on walker. Heavy verbal cueing to push from seat versus walker for transferring.  Sit to stand: Difficulty initiating with slowed velocity of first 25% of transfer following lift from mat.   GAIT: Distance walked: 60 ft Assistive device utilized: Walker - 2 wheeled Level of assistance: CGA Comments: Heavy upper limb support on walker. Step-to pattern with RLE leading. Decreased terminal knee extension of L prosthesis at initial contact that can be partially corrected with verbal cueing for swing technique. Decreased LLE weight shift during stance phase. Decreased hip extension during mid-stance to terminal stance.                                                                                                                                 TREATMENT DATE: 09/15/2023  SUBJECTIVE STATEMENT:   Patient reports needing to follow up with prosthetist due to gap along lateral aspect of her AKA prostheses socket  proximally; she's still using sock of adequate thickness for snug fit distally. Patient reports notable phantom limb symptoms and restless leg symptoms in RLE with Hx of below-knee amputation. Patient is needing to get re-fill on Gabapentin . Pt reports no more falls/significant incidents in her home.    Therapeutic Activities - patient education, repetitive task practice for improved performance of daily functional activities e.g. transferring, weight transfer practice to improve static standing activities and weight shift during ambulation  In // bars: Ambulation in parallel bars with unilateral RUE assist on bars, reciprocal step-through pattern x multiple lengths, practiced R turns with RUE support only x multiple lengths; Ambulation in parallel bars with quad cane R and three point partial step-through pattern, dec step length on R x multiple lengths; Ambulation in hallway x approximately 245' laps with rollator, step-through gait pattern, practiced turns at both ends of the hallway.  Sit to stand from low mat with mat raised; x 2 trials with hands on anterior thighs with multiple attempts required, and x 1 with one hand on table and one on AD    PATIENT EDUCATION: Discussed progression in AD usage with transition to LRAD as stability and postural control in bilateral prostheses improves. Discussed use of rollator in home and short-distance community trips. Discussed future use of quad cane for shorter trips in home once she demonstrates safer, more-confident gait.    *next visit* Sit to stand with dec UE support, pushing from seat only and attaining upright posture/bilateral stance IND; x 5 repetitions Functional reach with no UE support (intermittent contact with bar if losing balance); 8 cones - stacking and unstacking, placing on adjacent raised table; x 5 minutes Stair task practice Forward step up to 6-inch step; x10 with RLE above-knee prosthesis leading Forward step-up; RLE first  with LLE trailing; x 8 reps Sit to stand with chair + Airex; task practice to reduce upper limb assistance and controlled descent; x 5 reps Weight shifting with bilateral symmetrical stance and no UE support alternating R/L; x 60 sec   PATIENT EDUCATION:  Education details: see above for patient education details Person educated: Patient Education method: Explanation, Demonstration, and Handouts Education comprehension: verbalized understanding and returned demonstration  HOME EXERCISE PROGRAM: Access Code: [email or web address] URL: [email or web address] Date: 06/23/2023 Prepared by: Denese Finn  Exercises - Prone Hip Flexor Stretch with Towel Roll (AKA)  - 2-3 x daily - 7 x weekly - 1 sets - 2-3 min hold - Supine Hip Abduction  - 2 x daily - 7 x weekly - 2 sets - 10 reps - Sidelying Hip Abduction (AKA)  - 2 x daily - 7 x weekly - 2 sets - 10 reps - Prone Hip Extension with Residual Limb (AKA)  - 2 x daily - 7 x weekly - 2 sets - 10 reps  ASSESSMENT:  CLINICAL IMPRESSION: Pt demonstrates significant improvement in safety with less-restrictive assistive device. She exhibits safe ambulation with rollator for longer-distance on even, non-compliant terrain. She exhibits improving placement of quad cane and stability c quad cane during gait training in // bars. Pt does have notable fatigue following gait trials with aforementioned less-restrictive ADs. Patient presents with remaining deficits including: prosthetic gait deviations, hip weakness, imbalance, and difficulty with weight shifting/transfers. Pt will continue to benefit from skilled PT services to address deficits and improve function.  OBJECTIVE IMPAIRMENTS: Abnormal gait, decreased  balance, decreased mobility, difficulty walking, decreased strength, prosthetic dependency , and pain.   ACTIVITY LIMITATIONS: carrying, standing, stairs, transfers, bed mobility, and locomotion level  PARTICIPATION LIMITATIONS: cleaning,  laundry, shopping, and community activity  PERSONAL FACTORS: Age, Past/current experiences, and 3+ comorbidities: (anxiety, depression, CKD, COPD, DM, DKA, HTN lower extremity ischemia) are also affecting patient's functional outcome.   REHAB POTENTIAL: Good  CLINICAL DECISION MAKING: Evolving/moderate complexity  EVALUATION COMPLEXITY: Moderate   GOALS: Goals reviewed with patient? Yes  SHORT TERM GOALS: Target date: 07/14/23 Patient will be independent and 100% compliant with HEP as needed for carryover of PT intervention Baseline: 06/21/23: Baseline HEP reviewed.  08/04/23: largely compliant with HEP.  Goal status: IN PROGRESS/MOSTLY MET  2.  Patient will attain L hip hyperextension to 15 deg indicative of improved ROM required for terminal stance/heel off during gait Baseline: 06/21/23: L hip extension -5.   08/04/23: 5 deg hyperextension Goal status: IN PROGRESS  3.  Patient will demonstrate modified-independent sit to stand and stand-pivot transfer from W/C to adjacent sitting surface with no LOB and no verbal cueing required from therapist for technique Baseline: 06/21/23: Difficulty and some cueing needed for safety with transferring and stand-pivot transfer.   08/04/23: completed with supervision level of assist  Goal status: IN PROGRESS/MOSTLY MET   LONG TERM GOALS: Target date: 09/01/23  Patient will ambulate at community-level distance with front-wheeled walker ModI with no LOB and normalized hip extension and swing through for L above-knee prosthesis Baseline: 06/21/23: Noted gait deviations and pt ambulating short of household distance with L AKA prosthesis.   08/04/23: Ambulates for home distance with CGA. Limited capacity for community-level distance walking with FWW.  Goal status: IN PROGRESS  2.  Pt will improve ABC Scale by 13% or greater indicative of improved confidence related to balance and fall risk and clinically meaningful change Baseline: 06/21/23: ABC Scale to be  obtained on visit # 2.   06/27/23: 6.3%  08/04/23: 8.8% Goal status: NOT MET  3.  Pt will perform TUG in less than 20 seconds indicative of decreased risk of falls relative to patient-specific population Baseline: 06/21/23: TUG to be completed on visit # 2.   06/27/23: 136 sec.  08/04/23: Deferred to next visit.    08/09/23: 59.1 sec.  Goal status: IN PROGRESS  4.  Pt will improve Amputee Mobility Predictor by 4 points or greater indicative of detectable change in ADLs and functional mobility with prostheses.  Baseline: 06/21/23: AMPPRO to be completed on visits #2-3.  07/06/23: 23/47 08/04/23:  22/47. Goal status: NOT MET   5.  Patient will negotiate 3 steps ModI with FWW as needed for entering/exiting her home. Baseline: 06/21/23: Significant difficulty with stair negotiation.  08/04/23: Completed today with ModA x 2 with heavy upper limb assist.  Goal status: IN PROGRESS    PLAN:  PT FREQUENCY: 2x/week  PT DURATION: 10 weeks  PLANNED INTERVENTIONS: 97164- PT Re-evaluation, 97110-Therapeutic exercises, 97530- Therapeutic activity, [id number]- Neuromuscular re-education, 97535- Self Care, 86578- Prosthetic training, Patient/Family education, and Balance training  PLAN FOR NEXT SESSION: Continue with weight shifting and gait training with new L AKA prosthesis, correcting gait deviations. Progress with lessening upper limb support as able to negotiating various obstacles/terrain with bilat prostheses. Focus on progressive gait training and functional activities; progression to safest LRAD.    Denese Finn, PT, DPT #[id number]  Aleatha Hunting, PT 09/15/2023, 1:48 PM

## 2023-09-21 ENCOUNTER — Encounter: Admitting: Physical Therapy

## 2023-09-22 ENCOUNTER — Ambulatory Visit: Payer: 59 | Admitting: Cardiology

## 2023-09-27 ENCOUNTER — Ambulatory Visit: Admitting: Physical Therapy

## 2023-09-29 ENCOUNTER — Encounter: Admitting: Physical Therapy

## 2023-10-04 ENCOUNTER — Ambulatory Visit: Admitting: Physical Therapy

## 2023-10-06 ENCOUNTER — Ambulatory Visit: Admitting: Physical Therapy

## 2023-10-11 ENCOUNTER — Ambulatory Visit: Attending: Nurse Practitioner | Admitting: Physical Therapy

## 2023-10-11 DIAGNOSIS — R2689 Other abnormalities of gait and mobility: Secondary | ICD-10-CM | POA: Insufficient documentation

## 2023-10-11 DIAGNOSIS — M6281 Muscle weakness (generalized): Secondary | ICD-10-CM | POA: Insufficient documentation

## 2023-10-11 DIAGNOSIS — R262 Difficulty in walking, not elsewhere classified: Secondary | ICD-10-CM | POA: Insufficient documentation

## 2023-10-11 DIAGNOSIS — Z89612 Acquired absence of left leg above knee: Secondary | ICD-10-CM | POA: Insufficient documentation

## 2023-10-11 DIAGNOSIS — Z89511 Acquired absence of right leg below knee: Secondary | ICD-10-CM | POA: Insufficient documentation

## 2023-10-11 NOTE — Therapy (Deleted)
 OUTPATIENT PHYSICAL THERAPY RE-ASSESSMENT/RESUME TREATMENT/RE-CERTIFICATION   Patient Name: Sylvia [name] MRN: [id number] DOB:Sep 17, 1955, 68 y.o., female Today's Date: 10/11/2023   END OF SESSION:    Past Medical History:  Diagnosis Date   Anemia of chronic disease 05/16/2019   Anxiety    h/o   Arthritis    Chronic kidney disease    Cocaine abuse (HCC)    COPD (chronic obstructive pulmonary disease) (HCC)    Coronary artery disease    Depression    Diabetes mellitus without complication (HCC)    DKA (diabetic ketoacidosis) (HCC)    Elevated troponin    GERD (gastroesophageal reflux disease)    GI bleed    Hypertension    bp under control-off meds since 2019   Ischemic leg    Past Surgical History:  Procedure Laterality Date   ABDOMINAL HYSTERECTOMY     AMPUTATION Right 12/27/2018   Procedure: AMPUTATION BELOW KNEE;  Surgeon: Marea Selinda RAMAN, MD;  Location: [facility];  Service: General;  Laterality: Right;   AMPUTATION Left 11/05/2021   Procedure: AMPUTATION BELOW KNEE;  Surgeon: Marea Selinda RAMAN, MD;  Location: [facility];  Service: Vascular;  Laterality: Left;   AMPUTATION Left 11/04/2022   Procedure: AMPUTATION ABOVE KNEE;  Surgeon: Marea Selinda RAMAN, MD;  Location: [facility];  Service: Vascular;  Laterality: Left;   APPLICATION OF WOUND VAC  10/16/2021   Procedure: APPLICATION OF WOUND VAC;  Surgeon: Marea Selinda RAMAN, MD;  Location: [facility];  Service: Vascular;;   CATARACT EXTRACTION W/PHACO Right 03/27/2020   Procedure: CATARACT EXTRACTION PHACO AND INTRAOCULAR LENS PLACEMENT (IOC) RIGHT DIABETIC 7.54 00:52.5;  Surgeon: Jaye Fallow, MD;  Location: [facility];  Service: Ophthalmology;  Laterality: Right;   CATARACT EXTRACTION W/PHACO Left 05/20/2020   Procedure: CATARACT EXTRACTION PHACO AND INTRAOCULAR LENS PLACEMENT (IOC) LEFT DIABETIC 4.95 00:37.6;  Surgeon: Jaye Fallow, MD;  Location: [facility];  Service: Ophthalmology;  Laterality: Left;  Diabetic  - oral meds COVID + 04-24-20   CHOLECYSTECTOMY     COLONOSCOPY WITH PROPOFOL  N/A 04/16/2019   Procedure: COLONOSCOPY WITH PROPOFOL ;  Surgeon: Janalyn Keene NOVAK, MD;  Location: [facility];  Service: Endoscopy;  Laterality: N/A;   COLONOSCOPY WITH PROPOFOL  N/A 01/16/2020   Procedure: COLONOSCOPY WITH PROPOFOL ;  Surgeon: Janalyn Keene NOVAK, MD;  Location: [facility];  Service: Endoscopy;  Laterality: N/A;   ENDARTERECTOMY FEMORAL Left 10/16/2021   Procedure: ENDARTERECTOMY FEMORAL;  Surgeon: Marea Selinda RAMAN, MD;  Location: [facility];  Service: Vascular;  Laterality: Left;   ESOPHAGOGASTRODUODENOSCOPY (EGD) WITH PROPOFOL  N/A 06/15/2018   Procedure: ESOPHAGOGASTRODUODENOSCOPY (EGD) WITH PROPOFOL ;  Surgeon: Toledo, Ladell POUR, MD;  Location: [facility];  Service: Gastroenterology;  Laterality: N/A;   ESOPHAGOGASTRODUODENOSCOPY (EGD) WITH PROPOFOL  N/A 01/03/2019   Procedure: ESOPHAGOGASTRODUODENOSCOPY (EGD) WITH PROPOFOL ;  Surgeon: Jinny Carmine, MD;  Location: [facility];  Service: Endoscopy;  Laterality: N/A;   ESOPHAGOGASTRODUODENOSCOPY (EGD) WITH PROPOFOL  N/A 09/25/2021   Procedure: ESOPHAGOGASTRODUODENOSCOPY (EGD) WITH PROPOFOL ;  Surgeon: Jinny Carmine, MD;  Location: [facility];  Service: Endoscopy;  Laterality: N/A;   EYE SURGERY Bilateral    GIVENS CAPSULE STUDY  04/16/2019   Procedure: GIVENS CAPSULE STUDY;  Surgeon: Janalyn Keene NOVAK, MD;  Location: [facility];  Service: Endoscopy;;   GIVENS CAPSULE STUDY N/A 06/25/2019   Procedure: GIVENS CAPSULE STUDY;  Surgeon: Therisa Bi, MD;  Location: [facility];  Service: Gastroenterology;  Laterality: N/A;   INCISION AND DRAINAGE OF WOUND Left 02/24/2022   Procedure: LEFT BKA IRRIGATION AND DEBRIDEMENT WOUND;  Surgeon: Marea Selinda RAMAN, MD;  Location: [facility];  Service: Vascular;  Laterality: Left;   LOWER EXTREMITY ANGIOGRAPHY Right 08/21/2018   Procedure: LOWER EXTREMITY ANGIOGRAPHY;  Surgeon: Marea Selinda RAMAN, MD;  Location:  [facility];  Service: Cardiovascular;  Laterality: Right;   LOWER EXTREMITY ANGIOGRAPHY Left 08/28/2018   Procedure: LOWER EXTREMITY ANGIOGRAPHY;  Surgeon: Marea Selinda RAMAN, MD;  Location: [facility];  Service: Cardiovascular;  Laterality: Left;   LOWER EXTREMITY ANGIOGRAPHY Right 08/28/2018   Procedure: Lower Extremity Angiography;  Surgeon: Marea Selinda RAMAN, MD;  Location: [facility];  Service: Cardiovascular;  Laterality: Right;   LOWER EXTREMITY ANGIOGRAPHY Right 12/18/2018   Procedure: Lower Extremity Angiography;  Surgeon: Marea Selinda RAMAN, MD;  Location: [facility];  Service: Cardiovascular;  Laterality: Right;   LOWER EXTREMITY ANGIOGRAPHY Right 12/18/2018   Procedure: Lower Extremity Angiography;  Surgeon: Marea Selinda RAMAN, MD;  Location: [facility];  Service: Cardiovascular;  Laterality: Right;   LOWER EXTREMITY ANGIOGRAPHY Left 12/21/2018   Procedure: Lower Extremity Angiography;  Surgeon: Marea Selinda RAMAN, MD;  Location: [facility];  Service: Cardiovascular;  Laterality: Left;   LOWER EXTREMITY ANGIOGRAPHY Right 12/21/2018   Procedure: Lower Extremity Angiography;  Surgeon: Marea Selinda RAMAN, MD;  Location: [facility];  Service: Cardiovascular;  Laterality: Right;   LOWER EXTREMITY ANGIOGRAPHY Left 12/25/2020   Procedure: LOWER EXTREMITY ANGIOGRAPHY;  Surgeon: Marea Selinda RAMAN, MD;  Location: [facility];  Service: Cardiovascular;  Laterality: Left;   LOWER EXTREMITY ANGIOGRAPHY Left 09/21/2021   Procedure: Lower Extremity Angiography;  Surgeon: Marea Selinda RAMAN, MD;  Location: [facility];  Service: Cardiovascular;  Laterality: Left;   LOWER EXTREMITY ANGIOGRAPHY Left 10/13/2021   Procedure: Lower Extremity Angiography;  Surgeon: Jama Cordella MATSU, MD;  Location: [facility];  Service: Cardiovascular;  Laterality: Left;   LOWER EXTREMITY ANGIOGRAPHY Left 10/14/2021   Procedure: Lower Extremity Angiography;  Surgeon:  Marea Selinda RAMAN, MD;  Location: [facility];  Service: Cardiovascular;  Laterality: Left;   LOWER EXTREMITY ANGIOGRAPHY Left 11/02/2021   Procedure: Lower Extremity Angiography;  Surgeon: Marea Selinda RAMAN, MD;  Location: [facility];  Service: Cardiovascular;  Laterality: Left;   LOWER EXTREMITY INTERVENTION N/A 12/22/2018   Procedure: LOWER EXTREMITY INTERVENTION;  Surgeon: Marea Selinda RAMAN, MD;  Location: [facility];  Service: Cardiovascular;  Laterality: N/A;   LOWER EXTREMITY INTERVENTION Left 12/26/2020   Procedure: LOWER EXTREMITY INTERVENTION;  Surgeon: Marea Selinda RAMAN, MD;  Location: [facility];  Service: Cardiovascular;  Laterality: Left;   LOWER EXTREMITY INTERVENTION N/A 09/22/2021   Procedure: LOWER EXTREMITY INTERVENTION;  Surgeon: Marea Selinda RAMAN, MD;  Location: [facility];  Service: Cardiovascular;  Laterality: N/A;   Patient Active Problem List   Diagnosis Date Noted   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) 11/04/2022   Amputation above knee (HCC) 11/04/2022   Cellulitis of left lower extremity 02/23/2022   Sepsis due to cellulitis (HCC) 02/23/2022   Dyslipidemia 02/23/2022   Rheumatoid arthritis (HCC) 02/23/2022   Essential hypertension 02/23/2022   Type 2 diabetes mellitus with peripheral neuropathy (HCC) 02/23/2022   Multiple duodenal ulcers    Ischemic leg 12/25/2020   Rheumatoid arthritis of 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) 08/13/2020   Diabetes mellitus without complication (HCC)    GERD (gastroesophageal reflux disease)    COPD (chronic obstructive pulmonary disease) (  HCC)    GIB (gastrointestinal bleeding)    HLD (hyperlipidemia)    Depression    Elevated troponin    Syncope    Elevated troponin I level    Anemia of chronic disease 05/16/2019   Dizziness    Polyp of colon    Acute upper GI bleeding 04/13/2019   Anemia associated with acute blood loss 04/13/2019   Chronic  anticoagulation 04/13/2019   Hx of right BKA (HCC) 04/13/2019   Symptomatic anemia 04/13/2019   Upper GI bleed 04/13/2019   Acute GI bleeding 04/13/2019   Phantom pain after amputation of lower extremity (HCC) 01/30/2019   Melena    DU (duodenal ulcer)    DKA (diabetic ketoacidosis) (HCC) 12/14/2018   Atherosclerosis of artery of extremity with rest pain (HCC) 08/28/2018   PAD (peripheral artery disease) (HCC) 07/14/2018   Abdominal pain 06/13/2018   HTN (hypertension) 06/13/2018   Non-insulin  dependent type 2 diabetes mellitus (HCC) 06/13/2018   Elevated lactic acid level 06/13/2018    PCP: Carlin Blamer [facility]  REFERRING PROVIDER: Orvin FORBES Daring, NP  REFERRING DIAG:  [phone] (ICD-10-CM) - Hx of right BKA (HCC)  Z89.512 (ICD-10-CM) - Hx of BKA, left (HCC)    THERAPY DIAG:  Difficulty in walking, not elsewhere classified  Muscle weakness (generalized)  Imbalance  History of amputation below knee, right (HCC)  History of above knee amputation, left (HCC)  Rationale for Evaluation and Treatment: Rehabilitation  ONSET DATE: L AKA 11/04/22   PERTINENT HISTORY: Pt is a 68 year old female s/p L AKA 11/04/22. Pt has received above-knee prosthesis for L. Pt had R BKA in Sept 2020 with prosthetic rehab in this clinic. Pt is proficient with R below-knee prosthesis.  Patient reports standing with her prosthesis at home with her sister and walking with it some at home. Pt reports dealing with phantom limb pain. Patient reports getting this in R BKA and L AKA limbs. Patient reports Gabapentin  helps to control phantom limb symptoms. Pt had R BKA 4-5 years ago. Pt reports some tenderness along end of L residual limb. Pt reports that she has not used her prosthesis much over previous 2 weeks. She reports using shrinker each day and completing regular checks on skin integrity for L residual limb.  Type of amputation: Above-knee amputation  Onset: L AKA 11/04/22 Follow-up  appt with MD: Pt following up with Fallon E Brown for check-up between May and June Pain: 710 Present, 10/10 Worst: (Pain along L hip flexor/L groin) Phantom pain: Yes History of previous amputations/PAD: Yes; Hx of R BKA  Pain quality: pain quality: sharp Numbness/Tingling: Yes Imaging: No    WEIGHT BEARING RESTRICTIONS: No  FALLS:  Has patient fallen in last 6 months? No  LIVING ENVIRONMENT: Lives with: sister, great granddaughter 69 y/o, significant other  Lives in: House/apartment; 3 steps in front to get into home; temporary living until pt finds home with flat entryway;  Shower chair in bathroom; pt able to get in/out of bathroom Stairs: stairs into basement only, pt does not go there often Has following equipment at home: Vannie - 2 wheeled, Wheelchair (power), Wheelchair (manual), shower chair, and Grab bars; sliding board for W/C transfers  PLOF: Independent with household mobility with device  PATIENT GOALS: Get back to walking and improved independence with L AKA prosthesis  NEXT MD VISIT: Between May-June   OBJECTIVE:  Note: Objective measures were completed at Evaluation unless otherwise noted.  MUSCULOSKELETAL: Tremor: Absent Tone: Normal, no spasticity, rigidity,  or clonus Residual limb appears well-healing with no signs of erythema, skin breakdown, infection, or drainage  Posture Forward flexed posture in static standing  Palpation No pain to palpation along distal surface of residual limb.  No pain with palpation to quadriceps or hamstrings.    PATIENT SURVEYS:  ABC scale 6.3%  COGNITION: Overall cognitive status: Within functional limits for tasks assessed     SENSATION: Not tested   LOWER EXTREMITY ROM:  Active ROM Right eval Left eval Right 10/11/23 Left 10/11/23  Hip flexion WNL WNL    Hip extension WNL -5    Hip abduction 40 40    Hip adduction      Hip internal rotation      Hip external rotation      Knee flexion      Knee  extension      Ankle dorsiflexion      Ankle plantarflexion      Ankle inversion      Ankle eversion       (Blank rows = not tested)  LOWER EXTREMITY MMT:  MMT Right eval Left eval Right 10/11/23 Left 10/11/23  Hip flexion 4+ 4    Hip extension 4 4    Hip abduction 4+ 4+    Hip adduction      Hip internal rotation      Hip external rotation      Knee flexion 4+     Knee extension 4+     Ankle dorsiflexion      Ankle plantarflexion      Ankle inversion      Ankle eversion       (Blank rows = not tested)   FUNCTIONAL TASKS:  W/C to bed transfer: CGA with sit to stand, pt presents with heavy forward lean and weight shift toward RLE, heavy upper limb push on FWW once pt has hands on walker. Heavy verbal cueing to push from seat versus walker for transferring.  Sit to stand: Difficulty initiating with slowed velocity of first 25% of transfer following lift from mat.  10/11/23: ***  GAIT: Distance walked: 60 ft Assistive device utilized: Walker - 2 wheeled Level of assistance: CGA Comments: Heavy upper limb support on walker. Step-to pattern with RLE leading. Decreased terminal knee extension of L prosthesis at initial contact that can be partially corrected with verbal cueing for swing technique. Decreased LLE weight shift during stance phase. Decreased hip extension during mid-stance to terminal stance.  10/11/23: ***                                                                                                                                TREATMENT DATE: 10/11/2023   SUBJECTIVE STATEMENT:   Patient reports needing to follow up with prosthetist due to gap along lateral aspect of her AKA prostheses socket proximally; she's still using sock of adequate thickness for snug fit distally. Patient reports notable phantom limb symptoms and  restless leg symptoms in RLE with Hx of below-knee amputation. Patient is needing to get re-fill on Gabapentin . Pt reports no more  falls/significant incidents in her home.   *GOAL UPDATE PERFORMED   Therapeutic Activities - patient education, repetitive task practice for improved performance of daily functional activities e.g. transferring, weight transfer practice to improve static standing activities and weight shift during ambulation  Amputee Mobility Predictor performed*  In // bars: Ambulation in parallel bars with unilateral RUE assist on bars, reciprocal step-through pattern x multiple lengths, practiced R turns with RUE support only x multiple lengths; Ambulation in parallel bars with quad cane R and three point partial step-through pattern, dec step length on R x multiple lengths; Ambulation in hallway x approximately 245' laps with rollator, step-through gait pattern, practiced turns at both ends of the hallway.  Sit to stand from low mat with mat raised; x 2 trials with hands on anterior thighs with multiple attempts required, and x 1 with one hand on table and one on AD    PATIENT EDUCATION: Discussed progression in AD usage with transition to LRAD as stability and postural control in bilateral prostheses improves. Discussed use of rollator in home and short-distance community trips. Discussed future use of quad cane for shorter trips in home once she demonstrates safer, more-confident gait.    *next visit* Sit to stand with dec UE support, pushing from seat only and attaining upright posture/bilateral stance IND; x 5 repetitions Functional reach with no UE support (intermittent contact with bar if losing balance); 8 cones - stacking and unstacking, placing on adjacent raised table; x 5 minutes Stair task practice Forward step up to 6-inch step; x10 with RLE above-knee prosthesis leading Forward step-up; RLE first with LLE trailing; x 8 reps Sit to stand with chair + Airex; task practice to reduce upper limb assistance and controlled descent; x 5 reps Weight shifting with bilateral symmetrical stance and  no UE support alternating R/L; x 60 sec   PATIENT EDUCATION:  Education details: see above for patient education details Person educated: Patient Education method: Explanation, Demonstration, and Handouts Education comprehension: verbalized understanding and returned demonstration  HOME EXERCISE PROGRAM: Access Code: [email or web address] URL: [email or web address] Date: 06/23/2023 Prepared by: Venetia Endo  Exercises - Prone Hip Flexor Stretch with Towel Roll (AKA)  - 2-3 x daily - 7 x weekly - 1 sets - 2-3 min hold - Supine Hip Abduction  - 2 x daily - 7 x weekly - 2 sets - 10 reps - Sidelying Hip Abduction (AKA)  - 2 x daily - 7 x weekly - 2 sets - 10 reps - Prone Hip Extension with Residual Limb (AKA)  - 2 x daily - 7 x weekly - 2 sets - 10 reps  ASSESSMENT:  CLINICAL IMPRESSION: Pt demonstrates significant improvement in safety with less-restrictive assistive device. She exhibits safe ambulation with rollator for longer-distance on even, non-compliant terrain. She exhibits improving placement of quad cane and stability c quad cane during gait training in // bars. Pt does have notable fatigue following gait trials with aforementioned less-restrictive ADs. Patient presents with remaining deficits including: prosthetic gait deviations, hip weakness, imbalance, and difficulty with weight shifting/transfers. Pt will continue to benefit from skilled PT services to address deficits and improve function.  OBJECTIVE IMPAIRMENTS: Abnormal gait, decreased balance, decreased mobility, difficulty walking, decreased strength, prosthetic dependency , and pain.   ACTIVITY LIMITATIONS: carrying, standing, stairs, transfers, bed mobility, and locomotion level  PARTICIPATION LIMITATIONS: cleaning, laundry, shopping, and community  activity  PERSONAL FACTORS: Age, Past/current experiences, and 3+ comorbidities: (anxiety, depression, CKD, COPD, DM, DKA, HTN lower extremity ischemia) are also  affecting patient's functional outcome.   REHAB POTENTIAL: Good  CLINICAL DECISION MAKING: Evolving/moderate complexity  EVALUATION COMPLEXITY: Moderate   GOALS: Goals reviewed with patient? Yes  SHORT TERM GOALS: Target date: 07/14/23 Patient will be independent and 100% compliant with HEP as needed for carryover of PT intervention Baseline: 06/21/23: Baseline HEP reviewed.  08/04/23: largely compliant with HEP.     10/11/23: *** Goal status: IN PROGRESS/MOSTLY MET  2.  Patient will attain L hip hyperextension to 15 deg indicative of improved ROM required for terminal stance/heel off during gait Baseline: 06/21/23: L hip extension -5.   08/04/23: 5 deg hyperextension     10/11/23: *** Goal status: IN PROGRESS  3.  Patient will demonstrate modified-independent sit to stand and stand-pivot transfer from W/C to adjacent sitting surface with no LOB and no verbal cueing required from therapist for technique Baseline: 06/21/23: Difficulty and some cueing needed for safety with transferring and stand-pivot transfer.   08/04/23: completed with supervision level of assist.        10/11/23: *** Goal status: IN PROGRESS/MOSTLY MET   LONG TERM GOALS: Target date: 09/01/23  Patient will ambulate at community-level distance with front-wheeled walker ModI with no LOB and normalized hip extension and swing through for L above-knee prosthesis Baseline: 06/21/23: Noted gait deviations and pt ambulating short of household distance with L AKA prosthesis.   08/04/23: Ambulates for home distance with CGA. Limited capacity for community-level distance walking with FWW.       10/11/23: *** Goal status: IN PROGRESS  2.  Pt will improve ABC Scale by 13% or greater indicative of improved confidence related to balance and fall risk and clinically meaningful change Baseline: 06/21/23: ABC Scale to be obtained on visit # 2.   06/27/23: 6.3%  08/04/23: 8.8%.      10/11/23: *** Goal status: NOT MET  3.  Pt will perform TUG in less  than 20 seconds indicative of decreased risk of falls relative to patient-specific population Baseline: 06/21/23: TUG to be completed on visit # 2.   06/27/23: 136 sec.  08/04/23: Deferred to next visit.    08/09/23: 59.1 sec.  10/11/23: *** Goal status: IN PROGRESS  4.  Pt will improve Amputee Mobility Predictor by 4 points or greater indicative of detectable change in ADLs and functional mobility with prostheses.  Baseline: 06/21/23: AMPPRO to be completed on visits #2-3.  07/06/23: 23/47 08/04/23:  22/47.     10/11/23: ***/47 Goal status: NOT MET   5.  Patient will negotiate 3 steps ModI with FWW as needed for entering/exiting her home. Baseline: 06/21/23: Significant difficulty with stair negotiation.  08/04/23: Completed today with ModA x 2 with heavy upper limb assist.  10/11/23: *** Goal status: IN PROGRESS    PLAN:  PT FREQUENCY: 2x/week  PT DURATION: 10 weeks  PLANNED INTERVENTIONS: 97164- PT Re-evaluation, 97110-Therapeutic exercises, 97530- Therapeutic activity, 97112- Neuromuscular re-education, 97535- Self Care, [id number]- Prosthetic training, Patient/Family education, and Balance training  PLAN FOR NEXT SESSION: Continue with weight shifting and gait training with new L AKA prosthesis, correcting gait deviations. Progress with lessening upper limb support as able to negotiating various obstacles/terrain with bilat prostheses. Focus on progressive gait training and functional activities; progression to safest LRAD.    Venetia Endo, PT, DPT #[id number]  Venetia ONEIDA Endo, PT 10/11/2023, 7:49 AM

## 2023-10-12 NOTE — Therapy (Deleted)
 OUTPATIENT PHYSICAL THERAPY RE-ASSESSMENT/RESUME TREATMENT/RE-CERTIFICATION   Patient Name: Sylvia [name] MRN: [id number] DOB:[birth year], 68 y.o., female Today's Date: 10/12/2023   END OF SESSION:    Past Medical History:  Diagnosis Date   Anemia of chronic disease 05/16/2019   Anxiety    h/o   Arthritis    Chronic kidney disease    Cocaine abuse (HCC)    COPD (chronic obstructive pulmonary disease) (HCC)    Coronary artery disease    Depression    Diabetes mellitus without complication (HCC)    DKA (diabetic ketoacidosis) (HCC)    Elevated troponin    GERD (gastroesophageal reflux disease)    GI bleed    Hypertension    bp under control-off meds since 2019   Ischemic leg    Past Surgical History:  Procedure Laterality Date   ABDOMINAL HYSTERECTOMY     AMPUTATION Right 12/27/2018   Procedure: AMPUTATION BELOW KNEE;  Surgeon: Marea Selinda RAMAN, MD;  Location: [facility];  Service: General;  Laterality: Right;   AMPUTATION Left 11/05/2021   Procedure: AMPUTATION BELOW KNEE;  Surgeon: Marea Selinda RAMAN, MD;  Location: [facility];  Service: Vascular;  Laterality: Left;   AMPUTATION Left 11/04/2022   Procedure: AMPUTATION ABOVE KNEE;  Surgeon: Marea Selinda RAMAN, MD;  Location: [facility];  Service: Vascular;  Laterality: Left;   APPLICATION OF WOUND VAC  10/16/2021   Procedure: APPLICATION OF WOUND VAC;  Surgeon: Marea Selinda RAMAN, MD;  Location: [facility];  Service: Vascular;;   CATARACT EXTRACTION W/PHACO Right 03/27/2020   Procedure: CATARACT EXTRACTION PHACO AND INTRAOCULAR LENS PLACEMENT (IOC) RIGHT DIABETIC 7.54 00:52.5;  Surgeon: Jaye Fallow, MD;  Location: [facility];  Service: Ophthalmology;  Laterality: Right;   CATARACT EXTRACTION W/PHACO Left 05/20/2020   Procedure: CATARACT EXTRACTION PHACO AND INTRAOCULAR LENS PLACEMENT (IOC) LEFT DIABETIC 4.95 00:37.6;  Surgeon: Jaye Fallow, MD;  Location: [facility];  Service: Ophthalmology;  Laterality: Left;  Diabetic  - oral meds COVID + 04-24-20   CHOLECYSTECTOMY     COLONOSCOPY WITH PROPOFOL  N/A 04/16/2019   Procedure: COLONOSCOPY WITH PROPOFOL ;  Surgeon: Janalyn Keene NOVAK, MD;  Location: [facility];  Service: Endoscopy;  Laterality: N/A;   COLONOSCOPY WITH PROPOFOL  N/A 01/16/2020   Procedure: COLONOSCOPY WITH PROPOFOL ;  Surgeon: Janalyn Keene NOVAK, MD;  Location: [facility];  Service: Endoscopy;  Laterality: N/A;   ENDARTERECTOMY FEMORAL Left 10/16/2021   Procedure: ENDARTERECTOMY FEMORAL;  Surgeon: Marea Selinda RAMAN, MD;  Location: [facility];  Service: Vascular;  Laterality: Left;   ESOPHAGOGASTRODUODENOSCOPY (EGD) WITH PROPOFOL  N/A 06/15/2018   Procedure: ESOPHAGOGASTRODUODENOSCOPY (EGD) WITH PROPOFOL ;  Surgeon: Toledo, Ladell POUR, MD;  Location: [facility];  Service: Gastroenterology;  Laterality: N/A;   ESOPHAGOGASTRODUODENOSCOPY (EGD) WITH PROPOFOL  N/A 01/03/2019   Procedure: ESOPHAGOGASTRODUODENOSCOPY (EGD) WITH PROPOFOL ;  Surgeon: Jinny Carmine, MD;  Location: [facility];  Service: Endoscopy;  Laterality: N/A;   ESOPHAGOGASTRODUODENOSCOPY (EGD) WITH PROPOFOL  N/A 09/25/2021   Procedure: ESOPHAGOGASTRODUODENOSCOPY (EGD) WITH PROPOFOL ;  Surgeon: Jinny Carmine, MD;  Location: [facility];  Service: Endoscopy;  Laterality: N/A;   EYE SURGERY Bilateral    GIVENS CAPSULE STUDY  04/16/2019   Procedure: GIVENS CAPSULE STUDY;  Surgeon: Janalyn Keene NOVAK, MD;  Location: [facility];  Service: Endoscopy;;   GIVENS CAPSULE STUDY N/A 06/25/2019   Procedure: GIVENS CAPSULE STUDY;  Surgeon: Therisa Bi, MD;  Location: [facility];  Service: Gastroenterology;  Laterality: N/A;   INCISION AND DRAINAGE OF WOUND Left 02/24/2022   Procedure: LEFT BKA IRRIGATION AND DEBRIDEMENT WOUND;  Surgeon: Marea Selinda RAMAN, MD;  Location: [facility];  Service: Vascular;  Laterality: Left;   LOWER EXTREMITY ANGIOGRAPHY Right 08/21/2018   Procedure: LOWER EXTREMITY ANGIOGRAPHY;  Surgeon: Marea Selinda RAMAN, MD;  Location:  [facility];  Service: Cardiovascular;  Laterality: Right;   LOWER EXTREMITY ANGIOGRAPHY Left 08/28/2018   Procedure: LOWER EXTREMITY ANGIOGRAPHY;  Surgeon: Marea Selinda RAMAN, MD;  Location: [facility];  Service: Cardiovascular;  Laterality: Left;   LOWER EXTREMITY ANGIOGRAPHY Right 08/28/2018   Procedure: Lower Extremity Angiography;  Surgeon: Marea Selinda RAMAN, MD;  Location: [facility];  Service: Cardiovascular;  Laterality: Right;   LOWER EXTREMITY ANGIOGRAPHY Right 12/18/2018   Procedure: Lower Extremity Angiography;  Surgeon: Marea Selinda RAMAN, MD;  Location: [facility];  Service: Cardiovascular;  Laterality: Right;   LOWER EXTREMITY ANGIOGRAPHY Right 12/18/2018   Procedure: Lower Extremity Angiography;  Surgeon: Marea Selinda RAMAN, MD;  Location: [facility];  Service: Cardiovascular;  Laterality: Right;   LOWER EXTREMITY ANGIOGRAPHY Left 12/21/2018   Procedure: Lower Extremity Angiography;  Surgeon: Marea Selinda RAMAN, MD;  Location: [facility];  Service: Cardiovascular;  Laterality: Left;   LOWER EXTREMITY ANGIOGRAPHY Right 12/21/2018   Procedure: Lower Extremity Angiography;  Surgeon: Marea Selinda RAMAN, MD;  Location: [facility];  Service: Cardiovascular;  Laterality: Right;   LOWER EXTREMITY ANGIOGRAPHY Left 12/25/2020   Procedure: LOWER EXTREMITY ANGIOGRAPHY;  Surgeon: Marea Selinda RAMAN, MD;  Location: [facility];  Service: Cardiovascular;  Laterality: Left;   LOWER EXTREMITY ANGIOGRAPHY Left 09/21/2021   Procedure: Lower Extremity Angiography;  Surgeon: Marea Selinda RAMAN, MD;  Location: [facility];  Service: Cardiovascular;  Laterality: Left;   LOWER EXTREMITY ANGIOGRAPHY Left 10/13/2021   Procedure: Lower Extremity Angiography;  Surgeon: Jama Cordella MATSU, MD;  Location: [facility];  Service: Cardiovascular;  Laterality: Left;   LOWER EXTREMITY ANGIOGRAPHY Left 10/14/2021   Procedure: Lower Extremity Angiography;  Surgeon:  Marea Selinda RAMAN, MD;  Location: [facility];  Service: Cardiovascular;  Laterality: Left;   LOWER EXTREMITY ANGIOGRAPHY Left 11/02/2021   Procedure: Lower Extremity Angiography;  Surgeon: Marea Selinda RAMAN, MD;  Location: [facility];  Service: Cardiovascular;  Laterality: Left;   LOWER EXTREMITY INTERVENTION N/A 12/22/2018   Procedure: LOWER EXTREMITY INTERVENTION;  Surgeon: Marea Selinda RAMAN, MD;  Location: [facility];  Service: Cardiovascular;  Laterality: N/A;   LOWER EXTREMITY INTERVENTION Left 12/26/2020   Procedure: LOWER EXTREMITY INTERVENTION;  Surgeon: Marea Selinda RAMAN, MD;  Location: [facility];  Service: Cardiovascular;  Laterality: Left;   LOWER EXTREMITY INTERVENTION N/A 09/22/2021   Procedure: LOWER EXTREMITY INTERVENTION;  Surgeon: Marea Selinda RAMAN, MD;  Location: [facility];  Service: Cardiovascular;  Laterality: N/A;   Patient Active Problem List   Diagnosis Date Noted   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) 11/04/2022   Amputation above knee (HCC) 11/04/2022   Cellulitis of left lower extremity 02/23/2022   Sepsis due to cellulitis (HCC) 02/23/2022   Dyslipidemia 02/23/2022   Rheumatoid arthritis (HCC) 02/23/2022   Essential hypertension 02/23/2022   Type 2 diabetes mellitus with peripheral neuropathy (HCC) 02/23/2022   Multiple duodenal ulcers    Ischemic leg 12/25/2020   Rheumatoid arthritis of 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) 08/13/2020   Diabetes mellitus without complication (HCC)    GERD (gastroesophageal reflux disease)    COPD (chronic obstructive pulmonary disease) (  HCC)    GIB (gastrointestinal bleeding)    HLD (hyperlipidemia)    Depression    Elevated troponin    Syncope    Elevated troponin I level    Anemia of chronic disease 05/16/2019   Dizziness    Polyp of colon    Acute upper GI bleeding 04/13/2019   Anemia associated with acute blood loss 04/13/2019   Chronic  anticoagulation 04/13/2019   Hx of right BKA (HCC) 04/13/2019   Symptomatic anemia 04/13/2019   Upper GI bleed 04/13/2019   Acute GI bleeding 04/13/2019   Phantom pain after amputation of lower extremity (HCC) 01/30/2019   Melena    DU (duodenal ulcer)    DKA (diabetic ketoacidosis) (HCC) 12/14/2018   Atherosclerosis of artery of extremity with rest pain (HCC) 08/28/2018   PAD (peripheral artery disease) (HCC) 07/14/2018   Abdominal pain 06/13/2018   HTN (hypertension) 06/13/2018   Non-insulin  dependent type 2 diabetes mellitus (HCC) 06/13/2018   Elevated lactic acid level 06/13/2018    PCP: [facility]  REFERRING PROVIDER: Orvin FORBES Daring, NP  REFERRING DIAG:  [phone] (ICD-10-CM) - Hx of right BKA (HCC)  Z89.512 (ICD-10-CM) - Hx of BKA, left (HCC)    THERAPY DIAG:  Difficulty in walking, not elsewhere classified  Muscle weakness (generalized)  Imbalance  History of amputation below knee, right (HCC)  History of above knee amputation, left (HCC)  Rationale for Evaluation and Treatment: Rehabilitation  ONSET DATE: L AKA 11/04/22   PERTINENT HISTORY: Pt is a 68 year old female s/p L AKA 11/04/22. Pt has received above-knee prosthesis for L. Pt had R BKA in Sept 2020 with prosthetic rehab in this clinic. Pt is proficient with R below-knee prosthesis.  Patient reports standing with her prosthesis at home with her sister and walking with it some at home. Pt reports dealing with phantom limb pain. Patient reports getting this in R BKA and L AKA limbs. Patient reports Gabapentin  helps to control phantom limb symptoms. Pt had R BKA 4-5 years ago. Pt reports some tenderness along end of L residual limb. Pt reports that she has not used her prosthesis much over previous 2 weeks. She reports using shrinker each day and completing regular checks on skin integrity for L residual limb.  Type of amputation: Above-knee amputation  Onset: L AKA 11/04/22 Follow-up  appt with MD: Pt following up with Fallon E Brown for check-up between May and June Pain: 710 Present, 10/10 Worst: (Pain along L hip flexor/L groin) Phantom pain: Yes History of previous amputations/PAD: Yes; Hx of R BKA  Pain quality: pain quality: sharp Numbness/Tingling: Yes Imaging: No    WEIGHT BEARING RESTRICTIONS: No  FALLS:  Has patient fallen in last 6 months? No  LIVING ENVIRONMENT: Lives with: sister, great granddaughter 78 y/o, significant other  Lives in: House/apartment; 3 steps in front to get into home; temporary living until pt finds home with flat entryway;  Shower chair in bathroom; pt able to get in/out of bathroom Stairs: stairs into basement only, pt does not go there often Has following equipment at home: Vannie - 2 wheeled, Wheelchair (power), Wheelchair (manual), shower chair, and Grab bars; sliding board for W/C transfers  PLOF: Independent with household mobility with device  PATIENT GOALS: Get back to walking and improved independence with L AKA prosthesis  NEXT MD VISIT: Between May-June   OBJECTIVE:  Note: Objective measures were completed at Evaluation unless otherwise noted.  MUSCULOSKELETAL: Tremor: Absent Tone: Normal, no spasticity, rigidity,  or clonus Residual limb appears well-healing with no signs of erythema, skin breakdown, infection, or drainage  Posture Forward flexed posture in static standing  Palpation No pain to palpation along distal surface of residual limb.  No pain with palpation to quadriceps or hamstrings.    PATIENT SURVEYS:  ABC scale 6.3%  COGNITION: Overall cognitive status: Within functional limits for tasks assessed     SENSATION: Not tested   LOWER EXTREMITY ROM:  Active ROM Right eval Left eval Right 10/11/23 Left 10/11/23  Hip flexion WNL WNL    Hip extension WNL -5    Hip abduction 40 40    Hip adduction      Hip internal rotation      Hip external rotation      Knee flexion      Knee  extension      Ankle dorsiflexion      Ankle plantarflexion      Ankle inversion      Ankle eversion       (Blank rows = not tested)  LOWER EXTREMITY MMT:  MMT Right eval Left eval Right 10/11/23 Left 10/11/23  Hip flexion 4+ 4    Hip extension 4 4    Hip abduction 4+ 4+    Hip adduction      Hip internal rotation      Hip external rotation      Knee flexion 4+     Knee extension 4+     Ankle dorsiflexion      Ankle plantarflexion      Ankle inversion      Ankle eversion       (Blank rows = not tested)   FUNCTIONAL TASKS:  W/C to bed transfer: CGA with sit to stand, pt presents with heavy forward lean and weight shift toward RLE, heavy upper limb push on FWW once pt has hands on walker. Heavy verbal cueing to push from seat versus walker for transferring.  Sit to stand: Difficulty initiating with slowed velocity of first 25% of transfer following lift from mat.  10/11/23: ***  GAIT: Distance walked: 60 ft Assistive device utilized: Walker - 2 wheeled Level of assistance: CGA Comments: Heavy upper limb support on walker. Step-to pattern with RLE leading. Decreased terminal knee extension of L prosthesis at initial contact that can be partially corrected with verbal cueing for swing technique. Decreased LLE weight shift during stance phase. Decreased hip extension during mid-stance to terminal stance.  10/11/23: ***                                                                                                                                TREATMENT DATE: 10/12/2023   SUBJECTIVE STATEMENT:   Patient reports needing to follow up with prosthetist due to gap along lateral aspect of her AKA prostheses socket proximally; she's still using sock of adequate thickness for snug fit distally. Patient reports notable phantom limb symptoms and  restless leg symptoms in RLE with Hx of below-knee amputation. Patient is needing to get re-fill on Gabapentin . Pt reports no more  falls/significant incidents in her home.   *GOAL UPDATE PERFORMED   Therapeutic Activities - patient education, repetitive task practice for improved performance of daily functional activities e.g. transferring, weight transfer practice to improve static standing activities and weight shift during ambulation  Amputee Mobility Predictor performed*  In // bars: Ambulation in parallel bars with unilateral RUE assist on bars, reciprocal step-through pattern x multiple lengths, practiced R turns with RUE support only x multiple lengths; Ambulation in parallel bars with quad cane R and three point partial step-through pattern, dec step length on R x multiple lengths; Ambulation in hallway x approximately 245' laps with rollator, step-through gait pattern, practiced turns at both ends of the hallway.  Sit to stand from low mat with mat raised; x 2 trials with hands on anterior thighs with multiple attempts required, and x 1 with one hand on table and one on AD    PATIENT EDUCATION: Discussed progression in AD usage with transition to LRAD as stability and postural control in bilateral prostheses improves. Discussed use of rollator in home and short-distance community trips. Discussed future use of quad cane for shorter trips in home once she demonstrates safer, more-confident gait.    *next visit* Sit to stand with dec UE support, pushing from seat only and attaining upright posture/bilateral stance IND; x 5 repetitions Functional reach with no UE support (intermittent contact with bar if losing balance); 8 cones - stacking and unstacking, placing on adjacent raised table; x 5 minutes Stair task practice Forward step up to 6-inch step; x10 with RLE above-knee prosthesis leading Forward step-up; RLE first with LLE trailing; x 8 reps Sit to stand with chair + Airex; task practice to reduce upper limb assistance and controlled descent; x 5 reps Weight shifting with bilateral symmetrical stance and  no UE support alternating R/L; x 60 sec   PATIENT EDUCATION:  Education details: see above for patient education details Person educated: Patient Education method: Explanation, Demonstration, and Handouts Education comprehension: verbalized understanding and returned demonstration  HOME EXERCISE PROGRAM: Access Code: [email or web address] URL: [email or web address] Date: 06/23/2023 Prepared by: Venetia Endo  Exercises - Prone Hip Flexor Stretch with Towel Roll (AKA)  - 2-3 x daily - 7 x weekly - 1 sets - 2-3 min hold - Supine Hip Abduction  - 2 x daily - 7 x weekly - 2 sets - 10 reps - Sidelying Hip Abduction (AKA)  - 2 x daily - 7 x weekly - 2 sets - 10 reps - Prone Hip Extension with Residual Limb (AKA)  - 2 x daily - 7 x weekly - 2 sets - 10 reps  ASSESSMENT:  CLINICAL IMPRESSION: Pt demonstrates significant improvement in safety with less-restrictive assistive device. She exhibits safe ambulation with rollator for longer-distance on even, non-compliant terrain. She exhibits improving placement of quad cane and stability c quad cane during gait training in // bars. Pt does have notable fatigue following gait trials with aforementioned less-restrictive ADs. Patient presents with remaining deficits including: prosthetic gait deviations, hip weakness, imbalance, and difficulty with weight shifting/transfers. Pt will continue to benefit from skilled PT services to address deficits and improve function.  OBJECTIVE IMPAIRMENTS: Abnormal gait, decreased balance, decreased mobility, difficulty walking, decreased strength, prosthetic dependency , and pain.   ACTIVITY LIMITATIONS: carrying, standing, stairs, transfers, bed mobility, and locomotion level  PARTICIPATION LIMITATIONS: cleaning, laundry, shopping, and community  activity  PERSONAL FACTORS: Age, Past/current experiences, and 3+ comorbidities: (anxiety, depression, CKD, COPD, DM, DKA, HTN lower extremity ischemia) are also  affecting patient's functional outcome.   REHAB POTENTIAL: Good  CLINICAL DECISION MAKING: Evolving/moderate complexity  EVALUATION COMPLEXITY: Moderate   GOALS: Goals reviewed with patient? Yes  SHORT TERM GOALS: Target date: 07/14/23 Patient will be independent and 100% compliant with HEP as needed for carryover of PT intervention Baseline: 06/21/23: Baseline HEP reviewed.  08/04/23: largely compliant with HEP.     10/11/23: *** Goal status: IN PROGRESS/MOSTLY MET  2.  Patient will attain L hip hyperextension to 15 deg indicative of improved ROM required for terminal stance/heel off during gait Baseline: 06/21/23: L hip extension -5.   08/04/23: 5 deg hyperextension     10/11/23: *** Goal status: IN PROGRESS  3.  Patient will demonstrate modified-independent sit to stand and stand-pivot transfer from W/C to adjacent sitting surface with no LOB and no verbal cueing required from therapist for technique Baseline: 06/21/23: Difficulty and some cueing needed for safety with transferring and stand-pivot transfer.   08/04/23: completed with supervision level of assist.        10/11/23: *** Goal status: IN PROGRESS/MOSTLY MET   LONG TERM GOALS: Target date: 09/01/23  Patient will ambulate at community-level distance with front-wheeled walker ModI with no LOB and normalized hip extension and swing through for L above-knee prosthesis Baseline: 06/21/23: Noted gait deviations and pt ambulating short of household distance with L AKA prosthesis.   08/04/23: Ambulates for home distance with CGA. Limited capacity for community-level distance walking with FWW.       10/11/23: *** Goal status: IN PROGRESS  2.  Pt will improve ABC Scale by 13% or greater indicative of improved confidence related to balance and fall risk and clinically meaningful change Baseline: 06/21/23: ABC Scale to be obtained on visit # 2.   06/27/23: 6.3%  08/04/23: 8.8%.      10/11/23: *** Goal status: NOT MET  3.  Pt will perform TUG in less  than 20 seconds indicative of decreased risk of falls relative to patient-specific population Baseline: 06/21/23: TUG to be completed on visit # 2.   06/27/23: 136 sec.  08/04/23: Deferred to next visit.    08/09/23: 59.1 sec.  10/11/23: *** Goal status: IN PROGRESS  4.  Pt will improve Amputee Mobility Predictor by 4 points or greater indicative of detectable change in ADLs and functional mobility with prostheses.  Baseline: 06/21/23: AMPPRO to be completed on visits #2-3.  07/06/23: 23/47 08/04/23:  22/47.     10/11/23: ***/47 Goal status: NOT MET   5.  Patient will negotiate 3 steps ModI with FWW as needed for entering/exiting her home. Baseline: 06/21/23: Significant difficulty with stair negotiation.  08/04/23: Completed today with ModA x 2 with heavy upper limb assist.  10/11/23: *** Goal status: IN PROGRESS    PLAN:  PT FREQUENCY: 2x/week  PT DURATION: 10 weeks  PLANNED INTERVENTIONS: 97164- PT Re-evaluation, 97110-Therapeutic exercises, 97530- Therapeutic activity, 97112- Neuromuscular re-education, 97535- Self Care, [id number]- Prosthetic training, Patient/Family education, and Balance training  PLAN FOR NEXT SESSION: Continue with weight shifting and gait training with new L AKA prosthesis, correcting gait deviations. Progress with lessening upper limb support as able to negotiating various obstacles/terrain with bilat prostheses. Focus on progressive gait training and functional activities; progression to safest LRAD.    Venetia Endo, PT, DPT #[id number]  Venetia ONEIDA Endo, PT 10/12/2023, 9:57 PM

## 2023-10-13 ENCOUNTER — Ambulatory Visit: Admitting: Physical Therapy

## 2023-10-13 DIAGNOSIS — Z89612 Acquired absence of left leg above knee: Secondary | ICD-10-CM

## 2023-10-13 DIAGNOSIS — M6281 Muscle weakness (generalized): Secondary | ICD-10-CM

## 2023-10-13 DIAGNOSIS — R2689 Other abnormalities of gait and mobility: Secondary | ICD-10-CM

## 2023-10-13 DIAGNOSIS — R262 Difficulty in walking, not elsewhere classified: Secondary | ICD-10-CM

## 2023-10-13 DIAGNOSIS — Z89511 Acquired absence of right leg below knee: Secondary | ICD-10-CM

## 2023-10-18 ENCOUNTER — Ambulatory Visit: Admitting: Physical Therapy

## 2023-10-20 ENCOUNTER — Ambulatory Visit: Admitting: Physical Therapy

## 2023-11-29 ENCOUNTER — Ambulatory Visit (INDEPENDENT_AMBULATORY_CARE_PROVIDER_SITE_OTHER): Payer: 59 | Admitting: Vascular Surgery

## 2024-01-03 ENCOUNTER — Telehealth: Payer: Self-pay | Admitting: Oncology

## 2024-01-03 ENCOUNTER — Inpatient Hospital Stay

## 2024-01-03 NOTE — Telephone Encounter (Signed)
 Pt was scheduled for labs today (prior to MD visit this Thursday)  Pt no showed labs.  I called pt and she thought her appts were in October. Pt wants to r/s and wants a morning appts with labs and MD on the same day.  I asked Elizabeth,RN, if we can do labs and MD same day and she said yes, and cancel the iron  appts.   Pt aware of new date/time for lab/MD appt

## 2024-01-05 ENCOUNTER — Inpatient Hospital Stay

## 2024-01-05 ENCOUNTER — Inpatient Hospital Stay: Admitting: Oncology

## 2024-02-02 ENCOUNTER — Inpatient Hospital Stay (HOSPITAL_BASED_OUTPATIENT_CLINIC_OR_DEPARTMENT_OTHER): Admitting: Oncology

## 2024-02-02 ENCOUNTER — Ambulatory Visit: Payer: Self-pay | Admitting: Oncology

## 2024-02-02 ENCOUNTER — Encounter: Payer: Self-pay | Admitting: Oncology

## 2024-02-02 ENCOUNTER — Inpatient Hospital Stay: Attending: Oncology

## 2024-02-02 VITALS — BP 150/78 | HR 98 | Temp 96.5°F | Resp 98 | Wt 125.7 lb

## 2024-02-02 DIAGNOSIS — Z7985 Long-term (current) use of injectable non-insulin antidiabetic drugs: Secondary | ICD-10-CM | POA: Insufficient documentation

## 2024-02-02 DIAGNOSIS — Z803 Family history of malignant neoplasm of breast: Secondary | ICD-10-CM | POA: Diagnosis not present

## 2024-02-02 DIAGNOSIS — Z7901 Long term (current) use of anticoagulants: Secondary | ICD-10-CM | POA: Diagnosis not present

## 2024-02-02 DIAGNOSIS — I129 Hypertensive chronic kidney disease with stage 1 through stage 4 chronic kidney disease, or unspecified chronic kidney disease: Secondary | ICD-10-CM | POA: Insufficient documentation

## 2024-02-02 DIAGNOSIS — Z7982 Long term (current) use of aspirin: Secondary | ICD-10-CM | POA: Diagnosis not present

## 2024-02-02 DIAGNOSIS — F418 Other specified anxiety disorders: Secondary | ICD-10-CM | POA: Diagnosis not present

## 2024-02-02 DIAGNOSIS — N189 Chronic kidney disease, unspecified: Secondary | ICD-10-CM | POA: Insufficient documentation

## 2024-02-02 DIAGNOSIS — Z87891 Personal history of nicotine dependence: Secondary | ICD-10-CM | POA: Diagnosis not present

## 2024-02-02 DIAGNOSIS — Z79631 Long term (current) use of antimetabolite agent: Secondary | ICD-10-CM | POA: Diagnosis not present

## 2024-02-02 DIAGNOSIS — Z79899 Other long term (current) drug therapy: Secondary | ICD-10-CM | POA: Diagnosis not present

## 2024-02-02 DIAGNOSIS — D638 Anemia in other chronic diseases classified elsewhere: Secondary | ICD-10-CM

## 2024-02-02 DIAGNOSIS — D631 Anemia in chronic kidney disease: Secondary | ICD-10-CM | POA: Diagnosis not present

## 2024-02-02 DIAGNOSIS — E1122 Type 2 diabetes mellitus with diabetic chronic kidney disease: Secondary | ICD-10-CM | POA: Insufficient documentation

## 2024-02-02 LAB — RETIC PANEL
Immature Retic Fract: 11.5 % (ref 2.3–15.9)
RBC.: 3.67 MIL/uL — ABNORMAL LOW (ref 3.87–5.11)
Retic Count, Absolute: 56.9 K/uL (ref 19.0–186.0)
Retic Ct Pct: 1.6 % (ref 0.4–3.1)
Reticulocyte Hemoglobin: 33.2 pg (ref >27.9)

## 2024-02-02 LAB — CBC WITH DIFFERENTIAL (CANCER CENTER ONLY)
Abs Immature Granulocytes: 0.08 K/uL — ABNORMAL HIGH (ref 0.00–0.07)
Basophils Absolute: 0.1 K/uL (ref 0.0–0.1)
Basophils Relative: 1 %
Eosinophils Absolute: 0.4 K/uL (ref 0.0–0.5)
Eosinophils Relative: 4 %
HCT: 30.4 % — ABNORMAL LOW (ref 36.0–46.0)
Hemoglobin: 10.5 g/dL — ABNORMAL LOW (ref 12.0–15.0)
Immature Granulocytes: 1 %
Lymphocytes Relative: 29 %
Lymphs Abs: 2.6 K/uL (ref 0.7–4.0)
MCH: 27.8 pg (ref 26.0–34.0)
MCHC: 34.5 g/dL (ref 30.0–36.0)
MCV: 80.4 fL (ref 80.0–100.0)
Monocytes Absolute: 0.6 K/uL (ref 0.1–1.0)
Monocytes Relative: 7 %
Neutro Abs: 5.2 K/uL (ref 1.7–7.7)
Neutrophils Relative %: 58 %
Platelet Count: 383 K/uL (ref 150–400)
RBC: 3.78 MIL/uL — ABNORMAL LOW (ref 3.87–5.11)
RDW: 13.9 % (ref 11.5–15.5)
WBC Count: 8.9 K/uL (ref 4.0–10.5)
nRBC: 0 % (ref 0.0–0.2)

## 2024-02-02 LAB — IRON AND TIBC
Iron: 53 ug/dL (ref 28–170)
Saturation Ratios: 17 % (ref 10.4–31.8)
TIBC: 315 ug/dL (ref 250–450)
UIBC: 262 ug/dL

## 2024-02-02 LAB — FERRITIN: Ferritin: 171 ng/mL (ref 11–307)

## 2024-02-02 NOTE — Assessment & Plan Note (Addendum)
 Anemia of chronic disease - RA and CKD, marrow suppression from methotrexate Labs are reviewed and discussed with patient.   Lab Results  Component Value Date   HGB 10.5 (L) 02/02/2024   TIBC 315 02/02/2024   IRONPCTSAT 17 02/02/2024   FERRITIN 171 02/02/2024    Hemoglobin is stable at her baseline, recommend patient to take oral iron  supplementation every other day Recommend observation.

## 2024-02-02 NOTE — Progress Notes (Signed)
 Hematology/Oncology Progress note [phone] [phone]     REFERRING PROVIDER: [facility] CHIEF COMPLAINTS/REASON FOR VISIT:  Follow up of anemia  ASSESSMENT & PLAN:   Anemia of chronic disease Anemia of chronic disease - RA and CKD, marrow suppression from methotrexate Labs are reviewed and discussed with patient.   Lab Results  Component Value Date   HGB 10.5 (L) 02/02/2024   TIBC 315 02/02/2024   IRONPCTSAT 17 02/02/2024   FERRITIN 171 02/02/2024    Hemoglobin is stable at her baseline, recommend patient to take oral iron  supplementation every other day Recommend observation.     Orders Placed This Encounter  Procedures   CBC with Differential (Cancer Center Only)    Standing Status:   Future    Expected Date:   08/02/2024    Expiration Date:   10/31/2024   Iron  and TIBC    Standing Status:   Future    Expected Date:   08/02/2024    Expiration Date:   10/31/2024   Ferritin    Standing Status:   Future    Expected Date:   08/02/2024    Expiration Date:   10/31/2024   Retic Panel    Standing Status:   Future    Expected Date:   08/02/2024    Expiration Date:   10/31/2024   Follow-up in 6 months All questions were answered. The patient knows to call the clinic with any problems, questions or concerns.  Zelphia Cap, MD, PhD [facility] Hematology Oncology 02/02/2024    HISTORY OF PRESENTING ILLNESS:   Reviewed patient's recent labs  05/09/2019, hemoglobin 8.1, MCV 88, platelet 470. 04/16/2019, iron  panel showed saturation 18, ferritin 28, TIBC 308 Patient has been started on oral iron  supplementation NiFerrex 150 mg daily.  She reports tolerating well.  Reviewed patient's previous labs ordered by primary care physician's office, anemia is chronic onset , duration is since at least July 2017.  Patient's hemoglobin was about 9 back in September 2020.  She was admitted at that time due to severe PAD and right foot gangrene.  Status  post right below-knee amputation.   She was admitted in December due to acute drop of hemoglobin to 6.  She has a history of GI bleed secondary to gastric ulcer.  Received PRBC transfusion.  Patient is on Eliquis  2.5 mg twice daily. No aggravating or improving factors.  Last endoscopy: 04/13/2019 EGD colonoscopy and small bowel capsule study during her hospitalization.  EGD was negative, 04/16/2019 colonoscopy with small polyps removed, 04/18/2019 small bowel capsule study did not reach this small bowel and was inconclusive.   History of recurrent acute GI bleeding/melena, history of PUD on chronic anticoagulation. 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    RA, follows up with rheumatology Dr.Patel. On MTX with folic acid  and plaquenil .   11/05/2021-11/09/2021, patient was hospitalized due to peripheral artery disease, status post left below-knee amputation. Patient is  on Eliquis  5 mg twice daily as well as aspirin  81 mg daily.  INTERVAL HISTORY Sylvia [name] is a 68 y.o. female who has above history reviewed by me today presents for follow up visit for management of anemia Patient reports feeling okay.  Chronic fatigue unchanged. She has no new complaints.  Review of Systems  Constitutional:  Positive for fatigue. Negative for appetite change, chills, fever and unexpected weight change.  HENT:   Negative for hearing loss and voice change.   Eyes:  Negative for eye problems.  Respiratory:  Negative for chest tightness and cough.   Cardiovascular:  Negative for chest pain.  Gastrointestinal:  Negative for abdominal distention, abdominal pain and blood in stool.  Endocrine: Negative for hot flashes.  Genitourinary:  Negative for difficulty urinating and frequency.   Musculoskeletal:  Negative for arthralgias.  Skin:  Negative for itching and rash.  Neurological:  Negative for extremity weakness.  Hematological:  Negative for adenopathy.  Psychiatric/Behavioral:  Negative  for confusion.     MEDICAL HISTORY:  Past Medical History:  Diagnosis Date   Anemia of chronic disease 05/16/2019   Anxiety    h/o   Arthritis    Chronic kidney disease    Cocaine abuse (HCC)    COPD (chronic obstructive pulmonary disease) (HCC)    Coronary artery disease    Depression    Diabetes mellitus without complication (HCC)    DKA (diabetic ketoacidosis) (HCC)    Elevated troponin    GERD (gastroesophageal reflux disease)    GI bleed    Hypertension    bp under control-off meds since 2019   Ischemic leg     SURGICAL HISTORY: Past Surgical History:  Procedure Laterality Date   ABDOMINAL HYSTERECTOMY     AMPUTATION Right 12/27/2018   Procedure: AMPUTATION BELOW KNEE;  Surgeon: Marea Selinda RAMAN, MD;  Location: [facility];  Service: General;  Laterality: Right;   AMPUTATION Left 11/05/2021   Procedure: AMPUTATION BELOW KNEE;  Surgeon: Marea Selinda RAMAN, MD;  Location: [facility];  Service: Vascular;  Laterality: Left;   AMPUTATION Left 11/04/2022   Procedure: AMPUTATION ABOVE KNEE;  Surgeon: Marea Selinda RAMAN, MD;  Location: [facility];  Service: Vascular;  Laterality: Left;   APPLICATION OF WOUND VAC  10/16/2021   Procedure: APPLICATION OF WOUND VAC;  Surgeon: Marea Selinda RAMAN, MD;  Location: [facility];  Service: Vascular;;   CATARACT EXTRACTION W/PHACO Right 03/27/2020   Procedure: CATARACT EXTRACTION PHACO AND INTRAOCULAR LENS PLACEMENT (IOC) RIGHT DIABETIC 7.54 00:52.5;  Surgeon: Jaye Fallow, MD;  Location: [facility];  Service: Ophthalmology;  Laterality: Right;   CATARACT EXTRACTION W/PHACO Left 05/20/2020   Procedure: CATARACT EXTRACTION PHACO AND INTRAOCULAR LENS PLACEMENT (IOC) LEFT DIABETIC 4.95 00:37.6;  Surgeon: Jaye Fallow, MD;  Location: [facility];  Service: Ophthalmology;  Laterality: Left;  Diabetic - oral meds COVID + 04-24-20   CHOLECYSTECTOMY     COLONOSCOPY WITH PROPOFOL  N/A 04/16/2019   Procedure: COLONOSCOPY WITH PROPOFOL ;  Surgeon: Janalyn Keene NOVAK, MD;  Location: [facility];  Service: Endoscopy;  Laterality: N/A;   COLONOSCOPY WITH PROPOFOL  N/A 01/16/2020   Procedure: COLONOSCOPY WITH PROPOFOL ;  Surgeon: Janalyn Keene NOVAK, MD;  Location: [facility];  Service: Endoscopy;  Laterality: N/A;   ENDARTERECTOMY FEMORAL Left 10/16/2021   Procedure: ENDARTERECTOMY FEMORAL;  Surgeon: Marea Selinda RAMAN, MD;  Location: [facility];  Service: Vascular;  Laterality: Left;   ESOPHAGOGASTRODUODENOSCOPY (EGD) WITH PROPOFOL  N/A 06/15/2018   Procedure: ESOPHAGOGASTRODUODENOSCOPY (EGD) WITH PROPOFOL ;  Surgeon: Toledo, Ladell POUR, MD;  Location: [facility];  Service: Gastroenterology;  Laterality: N/A;   ESOPHAGOGASTRODUODENOSCOPY (EGD) WITH PROPOFOL  N/A 01/03/2019   Procedure: ESOPHAGOGASTRODUODENOSCOPY (EGD) WITH PROPOFOL ;  Surgeon: Jinny Carmine, MD;  Location: [facility];  Service: Endoscopy;  Laterality: N/A;   ESOPHAGOGASTRODUODENOSCOPY (EGD) WITH PROPOFOL  N/A 09/25/2021   Procedure: ESOPHAGOGASTRODUODENOSCOPY (EGD) WITH PROPOFOL ;  Surgeon: Jinny Carmine, MD;  Location: [facility];  Service: Endoscopy;  Laterality: N/A;   EYE SURGERY Bilateral    GIVENS CAPSULE STUDY  04/16/2019   Procedure: GIVENS CAPSULE STUDY;  Surgeon: Janalyn Keene NOVAK, MD;  Location: [facility];  Service: Endoscopy;;   GIVENS CAPSULE STUDY N/A 06/25/2019   Procedure: GIVENS CAPSULE STUDY;  Surgeon: Therisa Bi, MD;  Location: [facility];  Service: Gastroenterology;  Laterality: N/A;   INCISION AND DRAINAGE OF WOUND Left 02/24/2022   Procedure: LEFT BKA IRRIGATION AND DEBRIDEMENT WOUND;  Surgeon: Marea Selinda RAMAN, MD;  Location: [facility];  Service: Vascular;  Laterality: Left;   LOWER EXTREMITY ANGIOGRAPHY Right 08/21/2018   Procedure: LOWER EXTREMITY ANGIOGRAPHY;  Surgeon: Marea Selinda RAMAN, MD;  Location: [facility];  Service: Cardiovascular;  Laterality: Right;   LOWER EXTREMITY ANGIOGRAPHY Left 08/28/2018   Procedure: LOWER EXTREMITY  ANGIOGRAPHY;  Surgeon: Marea Selinda RAMAN, MD;  Location: [facility];  Service: Cardiovascular;  Laterality: Left;   LOWER EXTREMITY ANGIOGRAPHY Right 08/28/2018   Procedure: Lower Extremity Angiography;  Surgeon: Marea Selinda RAMAN, MD;  Location: [facility];  Service: Cardiovascular;  Laterality: Right;   LOWER EXTREMITY ANGIOGRAPHY Right 12/18/2018   Procedure: Lower Extremity Angiography;  Surgeon: Marea Selinda RAMAN, MD;  Location: [facility];  Service: Cardiovascular;  Laterality: Right;   LOWER EXTREMITY ANGIOGRAPHY Right 12/18/2018   Procedure: Lower Extremity Angiography;  Surgeon: Marea Selinda RAMAN, MD;  Location: [facility];  Service: Cardiovascular;  Laterality: Right;   LOWER EXTREMITY ANGIOGRAPHY Left 12/21/2018   Procedure: Lower Extremity Angiography;  Surgeon: Marea Selinda RAMAN, MD;  Location: [facility];  Service: Cardiovascular;  Laterality: Left;   LOWER EXTREMITY ANGIOGRAPHY Right 12/21/2018   Procedure: Lower Extremity Angiography;  Surgeon: Marea Selinda RAMAN, MD;  Location: [facility];  Service: Cardiovascular;  Laterality: Right;   LOWER EXTREMITY ANGIOGRAPHY Left 12/25/2020   Procedure: LOWER EXTREMITY ANGIOGRAPHY;  Surgeon: Marea Selinda RAMAN, MD;  Location: [facility];  Service: Cardiovascular;  Laterality: Left;   LOWER EXTREMITY ANGIOGRAPHY Left 09/21/2021   Procedure: Lower Extremity Angiography;  Surgeon: Marea Selinda RAMAN, MD;  Location: [facility];  Service: Cardiovascular;  Laterality: Left;   LOWER EXTREMITY ANGIOGRAPHY Left 10/13/2021   Procedure: Lower Extremity Angiography;  Surgeon: Jama Cordella MATSU, MD;  Location: [facility];  Service: Cardiovascular;  Laterality: Left;   LOWER EXTREMITY ANGIOGRAPHY Left 10/14/2021   Procedure: Lower Extremity Angiography;  Surgeon: Marea Selinda RAMAN, MD;  Location: [facility];  Service: Cardiovascular;  Laterality: Left;   LOWER EXTREMITY ANGIOGRAPHY Left 11/02/2021    Procedure: Lower Extremity Angiography;  Surgeon: Marea Selinda RAMAN, MD;  Location: [facility];  Service: Cardiovascular;  Laterality: Left;   LOWER EXTREMITY INTERVENTION N/A 12/22/2018   Procedure: LOWER EXTREMITY INTERVENTION;  Surgeon: Marea Selinda RAMAN, MD;  Location: [facility];  Service: Cardiovascular;  Laterality: N/A;   LOWER EXTREMITY INTERVENTION Left 12/26/2020   Procedure: LOWER EXTREMITY INTERVENTION;  Surgeon: Marea Selinda RAMAN, MD;  Location: [facility];  Service: Cardiovascular;  Laterality: Left;   LOWER EXTREMITY INTERVENTION N/A 09/22/2021   Procedure: LOWER EXTREMITY INTERVENTION;  Surgeon: Marea Selinda RAMAN, MD;  Location: [facility];  Service: Cardiovascular;  Laterality: N/A;    SOCIAL HISTORY: Social History   Socioeconomic History   Marital status: Divorced    Spouse name: Not on file   Number of children: 2   Years of education: Not on file   Highest education level: Not on file  Occupational History   Not on file  Tobacco Use  Smoking status: Former    Current packs/day: 0.25    Average packs/day: 0.3 packs/day for 45.0 years (11.3 ttl pk-yrs)    Types: Cigarettes   Smokeless tobacco: Never   Tobacco comments:    Half a pack a day   Vaping Use   Vaping status: Never Used  Substance and Sexual Activity   Alcohol use: No   Drug use: Not Currently    Types: Cocaine    Comment: last used in April 2021 per patient-last 2 drug tests have been negative for cocaine   Sexual activity: Not Currently  Other Topics Concern   Not on file  Social History Narrative   ** Merged History Encounter **       5 grandchildren and 5 great grandchildren  2 grandchildren live with Pt. (25 & 37 y.o.)   Social Drivers of Corporate investment banker Strain: Low Risk  (06/14/2018)   Overall Financial Resource Strain (CARDIA)    Difficulty of Paying Living Expenses: Not hard at all  Food Insecurity: No Food Insecurity (11/04/2022)   Hunger Vital  Sign    Worried About Running Out of Food in the Last Year: Never true    Ran Out of Food in the Last Year: Never true  Transportation Needs: No Transportation Needs (11/04/2022)   PRAPARE - Administrator, Civil Service (Medical): No    Lack of Transportation (Non-Medical): No  Physical Activity: Insufficiently Active (06/14/2018)   Exercise Vital Sign    Days of Exercise per Week: 4 days    Minutes of Exercise per Session: 20 min  Stress: No Stress Concern Present (06/14/2018)   [employer] of Occupational Health - Occupational Stress Questionnaire    Feeling of Stress : Only a little  Social Connections: Unknown (06/14/2018)   Social Connection and Isolation Panel    Frequency of Communication with Friends and Family: Patient declined    Frequency of Social Gatherings with Friends and Family: Patient declined    Attends Religious Services: Patient declined    Database administrator or Organizations: Patient declined    Attends Banker Meetings: Patient declined    Marital Status: Patient declined  Intimate Partner Violence: Not At Risk (11/04/2022)   Humiliation, Afraid, Rape, and Kick questionnaire    Fear of Current or Ex-Partner: No    Emotionally Abused: No    Physically Abused: No    Sexually Abused: No    FAMILY HISTORY: Family History  Problem Relation Age of Onset   Heart attack Mother    Breast cancer Mother 6    ALLERGIES:  is allergic to vancomycin , hydrocodone , metformin and related, penicillins, and tramadol .  MEDICATIONS:  Current Outpatient Medications  Medication Sig Dispense Refill   acetaminophen  (TYLENOL ) 500 MG tablet Take 500 mg by mouth every 6 (six) hours as needed.     albuterol  (PROVENTIL  HFA;VENTOLIN  HFA) 108 (90 Base) MCG/ACT inhaler Inhale 2 puffs into the lungs every 6 (six) hours as needed for wheezing or shortness of breath. 1 Inhaler 2   apixaban  (ELIQUIS ) 5 MG TABS tablet Take 1 tablet (5 mg total) by mouth 2  (two) times daily. 60 tablet 12   atorvastatin  (LIPITOR) 80 MG tablet Take 80 mg by mouth daily.     cetirizine (ZYRTEC) 10 MG tablet Take 10 mg by mouth daily as needed for allergies.     cyclobenzaprine  (FLEXERIL ) 10 MG tablet Take 1 tablet (10 mg total) by mouth 3 (three) times  daily as needed for muscle spasms. Take 1/2 a tablet as needed for pain 90 tablet 1   DULoxetine  (CYMBALTA ) 60 MG capsule Take 1 capsule (60 mg total) by mouth daily. 30 capsule 3   ferrous sulfate  325 (65 FE) MG tablet Take 325 mg by mouth daily.      gabapentin  (NEURONTIN ) 300 MG capsule TAKE 3 CAPSULES BY MOUTH IN THE  MORNING AND 3 CAPSULES BY MOUTH  IN THE EVENING 450 capsule 3   hydroxychloroquine  (PLAQUENIL ) 200 MG tablet Take 200 mg by mouth daily.     JARDIANCE  25 MG TABS tablet Take 25 mg by mouth daily.     lisinopril  (ZESTRIL ) 40 MG tablet Take 40 mg by mouth daily.     methotrexate (RHEUMATREX) 2.5 MG tablet Take 2.5 mg by mouth once a week.     metoprolol  succinate (TOPROL -XL) 50 MG 24 hr tablet Take 1 tablet (50 mg total) by mouth daily. Take with or immediately following a meal. 90 tablet 3   Multiple Vitamins-Minerals (WOMENS MULTIVITAMIN PO) Take 1 tablet by mouth daily.     nicotine  (NICODERM CQ  - DOSED IN MG/24 HOURS) 21 mg/24hr patch Place 1 patch (21 mg total) onto the skin daily. 28 patch 1   pantoprazole  (PROTONIX ) 40 MG tablet Take 1 tablet (40 mg total) by mouth daily. 30 tablet 11   VICTOZA  18 MG/3ML SOPN Inject 0.6 mg into the skin daily.     vitamin B-12 (CYANOCOBALAMIN ) 500 MCG tablet Take 1 tablet (500 mcg total) by mouth daily. 90 tablet 1   VOLTAREN ARTHRITIS PAIN 1 % GEL Apply 2 g topically 4 (four) times daily as needed.     No current facility-administered medications for this visit.     PHYSICAL EXAMINATION: ECOG PERFORMANCE STATUS: 1 - Symptomatic but completely ambulatory Vitals:   02/02/24 0937 02/02/24 0947  BP: (!) 148/81 (!) 150/78  Pulse: 98   Resp: (!) 98   Temp:  (!) 96.5 F (35.8 C)    Filed Weights   02/02/24 0937  Weight: 125 lb 11.2 oz (57 kg)    Physical Exam Constitutional:      General: She is not in acute distress.    Comments: Patient walks with a cane  HENT:     Head: Normocephalic and atraumatic.  Eyes:     General: No scleral icterus. Cardiovascular:     Rate and Rhythm: Normal rate.  Pulmonary:     Effort: Pulmonary effort is normal. No respiratory distress.     Breath sounds: No wheezing.  Abdominal:     General: Bowel sounds are normal. There is no distension.     Palpations: Abdomen is soft.  Musculoskeletal:        General: No deformity. Normal range of motion.     Cervical back: Normal range of motion and neck supple.     Comments: History of right below-knee amputation History of left below-knee amputation  Skin:    General: Skin is warm and dry.     Findings: No rash.  Neurological:     Mental Status: She is alert and oriented to person, place, and time. Mental status is at baseline.  Psychiatric:        Mood and Affect: Mood normal.      LABORATORY DATA:  I have reviewed the data as listed    Latest Ref Rng & Units 02/02/2024    9:33 AM 08/22/2023   10:10 AM 04/18/2023   11:36 AM  CBC  WBC 4.0 - 10.5 K/uL 8.9  9.6  9.7   Hemoglobin 12.0 - 15.0 g/dL 89.4  89.6  9.8   Hematocrit 36.0 - 46.0 % 30.4  30.6  28.5   Platelets 150 - 400 K/uL 383  406  322       Latest Ref Rng & Units 11/07/2022    4:44 AM 11/06/2022    4:39 AM 11/05/2022    9:03 AM  CMP  Glucose 70 - 99 mg/dL 862  875  882   BUN 8 - 23 mg/dL 18  13  14    Creatinine 0.44 - 1.00 mg/dL 9.03  9.06  9.11   Sodium 135 - 145 mmol/L 138  140  141   Potassium 3.5 - 5.1 mmol/L 3.5  3.6  3.7   Chloride 98 - 111 mmol/L 110  112  112   CO2 22 - 32 mmol/L 23  23  21    Calcium  8.9 - 10.3 mg/dL 8.8  8.7  8.9   Total Protein 6.5 - 8.1 g/dL 6.4     Total Bilirubin 0.3 - 1.2 mg/dL 0.7     Alkaline Phos 38 - 126 U/L 80     AST 15 - 41 U/L 12     ALT  0 - 44 U/L 10        Iron /TIBC/Ferritin/ %Sat    Component Value Date/Time   IRON  53 02/02/2024 0933   TIBC 315 02/02/2024 0933   FERRITIN 171 02/02/2024 0933   IRONPCTSAT 17 02/02/2024 0933

## 2024-02-12 ENCOUNTER — Other Ambulatory Visit: Payer: Self-pay

## 2024-02-12 ENCOUNTER — Emergency Department
Admission: EM | Admit: 2024-02-12 | Discharge: 2024-02-12 | Disposition: A | Discharge status: Home / Self Care | Attending: Emergency Medicine | Admitting: Emergency Medicine

## 2024-02-12 ENCOUNTER — Emergency Department

## 2024-02-12 DIAGNOSIS — R739 Hyperglycemia, unspecified: Secondary | ICD-10-CM

## 2024-02-12 DIAGNOSIS — R059 Cough, unspecified: Secondary | ICD-10-CM | POA: Insufficient documentation

## 2024-02-12 DIAGNOSIS — R350 Frequency of micturition: Secondary | ICD-10-CM | POA: Diagnosis present

## 2024-02-12 DIAGNOSIS — E860 Dehydration: Secondary | ICD-10-CM | POA: Insufficient documentation

## 2024-02-12 DIAGNOSIS — E1165 Type 2 diabetes mellitus with hyperglycemia: Secondary | ICD-10-CM | POA: Insufficient documentation

## 2024-02-12 DIAGNOSIS — R0602 Shortness of breath: Secondary | ICD-10-CM | POA: Diagnosis present

## 2024-02-12 LAB — COMPREHENSIVE METABOLIC PANEL WITH GFR
ALT: 11 U/L (ref 0–44)
AST: 20 U/L (ref 15–41)
Albumin: 3.9 g/dL (ref 3.5–5.0)
Alkaline Phosphatase: 116 U/L (ref 38–126)
Anion gap: 16 — ABNORMAL HIGH (ref 5–15)
BUN: 21 mg/dL (ref 8–23)
CO2: 19 mmol/L — ABNORMAL LOW (ref 22–32)
Calcium: 10.2 mg/dL (ref 8.9–10.3)
Chloride: 104 mmol/L (ref 98–111)
Creatinine, Ser: 1.16 mg/dL — ABNORMAL HIGH (ref 0.44–1.00)
GFR, Estimated: 51 mL/min — ABNORMAL LOW (ref >60)
Glucose, Bld: 262 mg/dL — ABNORMAL HIGH (ref 70–99)
Potassium: 4.2 mmol/L (ref 3.5–5.1)
Sodium: 139 mmol/L (ref 135–145)
Total Bilirubin: 0.8 mg/dL (ref 0.0–1.2)
Total Protein: 9 g/dL — ABNORMAL HIGH (ref 6.5–8.1)

## 2024-02-12 LAB — CBC WITH DIFFERENTIAL/PLATELET
Abs Immature Granulocytes: 0.03 K/uL (ref 0.00–0.07)
Basophils Absolute: 0.1 K/uL (ref 0.0–0.1)
Basophils Relative: 1 %
Eosinophils Absolute: 0.3 K/uL (ref 0.0–0.5)
Eosinophils Relative: 4 %
HCT: 33.2 % — ABNORMAL LOW (ref 36.0–46.0)
Hemoglobin: 11.1 g/dL — ABNORMAL LOW (ref 12.0–15.0)
Immature Granulocytes: 0 %
Lymphocytes Relative: 37 %
Lymphs Abs: 3.2 K/uL (ref 0.7–4.0)
MCH: 27.3 pg (ref 26.0–34.0)
MCHC: 33.4 g/dL (ref 30.0–36.0)
MCV: 81.8 fL (ref 80.0–100.0)
Monocytes Absolute: 0.5 K/uL (ref 0.1–1.0)
Monocytes Relative: 6 %
Neutro Abs: 4.5 K/uL (ref 1.7–7.7)
Neutrophils Relative %: 52 %
Platelets: 319 K/uL (ref 150–400)
RBC: 4.06 MIL/uL (ref 3.87–5.11)
RDW: 13.6 % (ref 11.5–15.5)
Smear Review: NORMAL
WBC: 8.7 K/uL (ref 4.0–10.5)
nRBC: 0 % (ref 0.0–0.2)

## 2024-02-12 LAB — URINALYSIS, W/ REFLEX TO CULTURE (INFECTION SUSPECTED)
Bilirubin Urine: NEGATIVE
Glucose, UA: >=500 mg/dL — AB
Hgb urine dipstick: NEGATIVE
Ketones, ur: NEGATIVE mg/dL
Leukocytes,Ua: NEGATIVE
Nitrite: NEGATIVE
Protein, ur: 30 mg/dL — AB
Specific Gravity, Urine: 1.01 (ref 1.005–1.030)
pH: 5 (ref 5.0–8.0)

## 2024-02-12 LAB — RESP PANEL BY RT-PCR (RSV, FLU A&B, COVID)  RVPGX2
Influenza A by PCR: NEGATIVE
Influenza B by PCR: NEGATIVE
Resp Syncytial Virus by PCR: NEGATIVE
SARS Coronavirus 2 by RT PCR: NEGATIVE

## 2024-02-12 MED ORDER — SODIUM CHLORIDE 0.9 % IV BOLUS
1000.0000 mL | Freq: Once | INTRAVENOUS | Status: AC
  Administered 2024-02-12: 1000 mL via INTRAVENOUS

## 2024-02-12 MED ORDER — ALBUTEROL SULFATE HFA 108 (90 BASE) MCG/ACT IN AERS: 2.0000 | INHALATION_SPRAY | Freq: Four times a day (QID) | RESPIRATORY_TRACT | 2 refills | Status: AC | PRN

## 2024-02-12 NOTE — ED Notes (Signed)
 Pt resting comfortably. Does not need to void yet.

## 2024-02-12 NOTE — ED Provider Notes (Signed)
 [facility] Provider Note    Event Date/Time   First MD Initiated Contact with Patient 02/12/24 1502     (approximate)   History   Weakness, shortness of breath, body aches   HPI  Sylvia [name] is a 68 y.o. female  who presents to the emergency department today with concerns for weakness, shortness of breath, body aches. Symptoms started this morning. She states she was in her normal state of health yesterday. She denies any known sick contacts. Has noticed increased frequency of urination recently and that her blood sugars have been running high. Denies any painful urination or bad odor to her urine. She denies any fevers or chills.       Physical Exam   Triage Vital Signs: ED Triage Vitals  Encounter Vitals Group     BP 02/12/24 1411 137/85     Girls Systolic BP Percentile --      Girls Diastolic BP Percentile --      Boys Systolic BP Percentile --      Boys Diastolic BP Percentile --      Pulse Rate 02/12/24 1411 (!) 110     Resp 02/12/24 1411 (!) 22     Temp 02/12/24 1411 99.3 F (37.4 C)     Temp Source 02/12/24 1411 Oral     SpO2 02/12/24 1411 100 %     Weight 02/12/24 1413 125 lb 10.6 oz (57 kg)     Height 02/12/24 1413 5' (1.524 m)     Head Circumference --      Peak Flow --      Pain Score 02/12/24 1411 8     Pain Loc --      Pain Education --      Exclude from Growth Chart --     Most recent vital signs: Vitals:   02/12/24 1411 02/12/24 1430  BP: 137/85   Pulse: (!) 110 (!) 101  Resp: (!) 22 17  Temp: 99.3 F (37.4 C)   SpO2: 100% 100%   General: Awake, alert, oriented. CV:  Good peripheral perfusion. Regular rate and rhythm. Resp:  Normal effort. Lungs clear. Abd:  No distention.    ED Results / Procedures / Treatments   Labs (all labs ordered are listed, but only abnormal results are displayed) Labs Reviewed  COMPREHENSIVE METABOLIC PANEL WITH GFR - Abnormal; Notable for the following components:       Result Value   CO2 19 (*)    Glucose, Bld 262 (*)    Creatinine, Ser 1.16 (*)    Total Protein 9.0 (*)    GFR, Estimated 51 (*)    Anion gap 16 (*)    All other components within normal limits  CBC WITH DIFFERENTIAL/PLATELET - Abnormal; Notable for the following components:   Hemoglobin 11.1 (*)    HCT 33.2 (*)    All other components within normal limits  RESP PANEL BY RT-PCR (RSV, FLU A&B, COVID)  [id number]  LACTIC ACID, PLASMA  LACTIC ACID, PLASMA  URINALYSIS, W/ REFLEX TO CULTURE (INFECTION SUSPECTED)     EKG  I, Guadalupe Eagles, attending physician, personally viewed and interpreted this EKG  EKG Time: 1412 Rate: 108 Rhythm: sinus tachycardia Axis: normal Intervals: qtc 475 QRS: narrow ST changes: no st elevation Impression: abnormal ekg   RADIOLOGY I independently interpreted and visualized the CXR. My interpretation: No pneumonia Radiology interpretation:  IMPRESSION:  Negative.     PROCEDURES:  Critical Care performed: No  MEDICATIONS ORDERED IN ED: Medications - No data to display   IMPRESSION / MDM / ASSESSMENT AND PLAN / ED COURSE  I reviewed the triage vital signs and the nursing notes.                              Differential diagnosis includes, but is not limited to, viral illness, pneumonia, electrolyte abnormality, dehydration  Patient's presentation is most consistent with acute presentation with potential threat to life or bodily function.   Patient presented to the emergency department today because concerns for weakness, shortness of breath and bodyaches.  On exam patient was awake and alert.  Lungs are clear.  Abdomen is benign.  Initially slightly tachycardic upon arrival however afebrile.  Blood work without concerning leukocytosis. COVID/Flu/RSV negative. Blood work does show hyperglycemia and a slight creatinine elevation.  I do think patient is likely dehydrated.  Will give IV fluids will check UA.    UA without concerning  abnormalities.  Patient did feel better after IV fluids.  At this time do wonder if she might be suffering from other viral illness, do think dehydration was playing a role.  Given clinical improvement and reassuring workup I think is reasonable for patient be discharged.      FINAL CLINICAL IMPRESSION(S) / ED DIAGNOSES   Final diagnoses:  Dehydration  Hyperglycemia     Note:  This document was prepared using Dragon voice recognition software and may include unintentional dictation errors.    Floy Roberts, MD 02/12/24 2059

## 2024-02-12 NOTE — ED Triage Notes (Signed)
 Pt to ED via [facility] from home for body aches, SOB, weakness, chest pressure and SOB since this AM.  Also green phlegm, cough, congestion, SOB since 2 weeks  EMS VS: 180/90, CBG 344 hx DM, 100% RA, HR 107, EKG ST, 98.7  Pt is crying and stating I can't stay, I need to go home to take care of my great grandson.   Pt is BKA on R, AKA on L

## 2024-02-12 NOTE — ED Notes (Signed)
 Pt placed on bedpan for urine sample.

## 2024-02-12 NOTE — ED Notes (Signed)
 Pt called out asking for water  for because she is having a stomach cramp. Told her cannot give her anything by mouth yet until has seen EDP.

## 2024-02-12 NOTE — ED Notes (Signed)
Provided crackers and water 

## 2024-02-15 ENCOUNTER — Telehealth (HOSPITAL_BASED_OUTPATIENT_CLINIC_OR_DEPARTMENT_OTHER): Payer: Self-pay

## 2024-02-15 ENCOUNTER — Telehealth: Payer: Self-pay

## 2024-02-15 NOTE — Telephone Encounter (Signed)
   Pre-operative Risk Assessment    Patient Name: Sylvia [name]  DOB: Jan 01, 1956 MRN: [id number]   Date of last office visit: 05/25/23 with Dr. Darliss Date of next office visit: NA   Request for Surgical Clearance    Procedure:  Colonoscopy  Date of Surgery:  Clearance 03/07/24                                 Surgeon:  Dr. Therisa Socks Group or Practice Name:  [facility] Phone number:  [phone] Fax number:  [phone]   Type of Clearance Requested:   - Medical  - Pharmacy:  Hold Apixaban  (Eliquis ) not indicated   Type of Anesthesia:  propofol    Additional requests/questions:    Bonney Augustin JONETTA Delores   02/15/2024, 12:29 PM

## 2024-02-15 NOTE — Telephone Encounter (Signed)
   Name: Sylvia [name]  DOB: [birth year]  MRN: [id number]  Primary Cardiologist: Redell Cave, MD   Preoperative team, please contact this patient and set up a phone call appointment for further preoperative risk assessment. Please obtain consent and complete medication review. Thank you for your help.  I confirm that guidance regarding antiplatelet and oral anticoagulation therapy has been completed and, if necessary, noted below.  Eliquis  is prescribed by Dr.Brabham. Please surgeon reach out to him for recommendations.  I also confirmed the patient resides in the state of [place] . As per [facility] Medical Board telemedicine laws, the patient must reside in the state in which the provider is licensed.   Lamarr Satterfield, NP 02/15/2024, 1:21 PM [facility]

## 2024-02-15 NOTE — Telephone Encounter (Signed)
  Patient Consent for Virtual Visit        Sylvia [name] has provided verbal consent on 02/15/2024 for a virtual visit (video or telephone).   CONSENT FOR VIRTUAL VISIT FOR:  Sylvia [name]  By participating in this virtual visit I agree to the following:  I hereby voluntarily request, consent and authorize [facility] and its employed or contracted physicians, physician assistants, nurse practitioners or other licensed health care professionals (the Practitioner), to provide me with telemedicine health care services (the "Services) as deemed necessary by the treating Practitioner. I acknowledge and consent to receive the Services by the Practitioner via telemedicine. I understand that the telemedicine visit will involve communicating with the Practitioner through live audiovisual communication technology and the disclosure of certain medical information by electronic transmission. I acknowledge that I have been given the opportunity to request an in-person assessment or other available alternative prior to the telemedicine visit and am voluntarily participating in the telemedicine visit.  I understand that I have the right to withhold or withdraw my consent to the use of telemedicine in the course of my care at any time, without affecting my right to future care or treatment, and that the Practitioner or I may terminate the telemedicine visit at any time. I understand that I have the right to inspect all information obtained and/or recorded in the course of the telemedicine visit and may receive copies of available information for a reasonable fee.  I understand that some of the potential risks of receiving the Services via telemedicine include:  Delay or interruption in medical evaluation due to technological equipment failure or disruption; Information transmitted may not be sufficient (e.g. poor resolution of images) to allow for appropriate medical decision making by the  Practitioner; and/or  In rare instances, security protocols could fail, causing a breach of personal health information.  Furthermore, I acknowledge that it is my responsibility to provide information about my medical history, conditions and care that is complete and accurate to the best of my ability. I acknowledge that Practitioner's advice, recommendations, and/or decision may be based on factors not within their control, such as incomplete or inaccurate data provided by me or distortions of diagnostic images or specimens that may result from electronic transmissions. I understand that the practice of medicine is not an exact science and that Practitioner makes no warranties or guarantees regarding treatment outcomes. I acknowledge that a copy of this consent can be made available to me via my patient portal [facility] MyChart), or I can request a printed copy by calling the office of [facility].    I understand that my insurance will be billed for this visit.   I have read or had this consent read to me. I understand the contents of this consent, which adequately explains the benefits and risks of the Services being provided via telemedicine.  I have been provided ample opportunity to ask questions regarding this consent and the Services and have had my questions answered to my satisfaction. I give my informed consent for the services to be provided through the use of telemedicine in my medical care

## 2024-02-15 NOTE — Telephone Encounter (Signed)
 Preop tele appt now scheduled, med rec and consent done.

## 2024-02-29 ENCOUNTER — Ambulatory Visit: Attending: Student in an Organized Health Care Education/Training Program | Admitting: Student

## 2024-02-29 DIAGNOSIS — Z01810 Encounter for preprocedural cardiovascular examination: Secondary | ICD-10-CM
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
## 2024-03-07 ENCOUNTER — Ambulatory Visit
Admission: RE | Admit: 2024-03-07 | Discharge: 2024-03-07 | Disposition: A | Discharge status: Home / Self Care | Attending: Gastroenterology | Admitting: Gastroenterology

## 2024-03-07 ENCOUNTER — Other Ambulatory Visit: Payer: Self-pay

## 2024-03-07 ENCOUNTER — Encounter: Payer: Self-pay | Admitting: Gastroenterology

## 2024-03-07 ENCOUNTER — Ambulatory Visit

## 2024-03-07 ENCOUNTER — Encounter
Admission: RE | Disposition: A | Discharge status: Home / Self Care | Payer: Self-pay | Source: Home / Self Care | Attending: Gastroenterology

## 2024-03-07 DIAGNOSIS — I2510 Atherosclerotic heart disease of native coronary artery without angina pectoris: Secondary | ICD-10-CM | POA: Diagnosis not present

## 2024-03-07 DIAGNOSIS — I129 Hypertensive chronic kidney disease with stage 1 through stage 4 chronic kidney disease, or unspecified chronic kidney disease: Secondary | ICD-10-CM | POA: Diagnosis not present

## 2024-03-07 DIAGNOSIS — Z1211 Encounter for screening for malignant neoplasm of colon: Secondary | ICD-10-CM | POA: Diagnosis present

## 2024-03-07 DIAGNOSIS — K5730 Diverticulosis of large intestine without perforation or abscess without bleeding: Secondary | ICD-10-CM | POA: Insufficient documentation

## 2024-03-07 DIAGNOSIS — F17210 Nicotine dependence, cigarettes, uncomplicated: Secondary | ICD-10-CM | POA: Insufficient documentation

## 2024-03-07 DIAGNOSIS — Z7985 Long-term (current) use of injectable non-insulin antidiabetic drugs: Secondary | ICD-10-CM | POA: Diagnosis not present

## 2024-03-07 DIAGNOSIS — N189 Chronic kidney disease, unspecified: Secondary | ICD-10-CM | POA: Insufficient documentation

## 2024-03-07 DIAGNOSIS — Z8711 Personal history of peptic ulcer disease: Secondary | ICD-10-CM | POA: Insufficient documentation

## 2024-03-07 DIAGNOSIS — J449 Chronic obstructive pulmonary disease, unspecified: Secondary | ICD-10-CM | POA: Diagnosis not present

## 2024-03-07 DIAGNOSIS — K219 Gastro-esophageal reflux disease without esophagitis: Secondary | ICD-10-CM | POA: Insufficient documentation

## 2024-03-07 DIAGNOSIS — E1122 Type 2 diabetes mellitus with diabetic chronic kidney disease: Secondary | ICD-10-CM | POA: Diagnosis not present

## 2024-03-07 DIAGNOSIS — Z7984 Long term (current) use of oral hypoglycemic drugs: Secondary | ICD-10-CM | POA: Insufficient documentation

## 2024-03-07 DIAGNOSIS — D123 Benign neoplasm of transverse colon: Secondary | ICD-10-CM | POA: Diagnosis not present

## 2024-03-07 DIAGNOSIS — D124 Benign neoplasm of descending colon: Secondary | ICD-10-CM | POA: Insufficient documentation

## 2024-03-07 DIAGNOSIS — E1151 Type 2 diabetes mellitus with diabetic peripheral angiopathy without gangrene: Secondary | ICD-10-CM | POA: Diagnosis not present

## 2024-03-07 DIAGNOSIS — R195 Other fecal abnormalities: Secondary | ICD-10-CM | POA: Insufficient documentation

## 2024-03-07 DIAGNOSIS — D122 Benign neoplasm of ascending colon: Secondary | ICD-10-CM | POA: Diagnosis not present

## 2024-03-07 LAB — GLUCOSE, CAPILLARY: Glucose-Capillary: 203 mg/dL — ABNORMAL HIGH (ref 70–99)

## 2024-03-07 SURGERY — COLONOSCOPY
Anesthesia: General

## 2024-03-07 MED ORDER — SODIUM CHLORIDE 0.9 % IV SOLN: INTRAVENOUS | Status: DC

## 2024-03-07 MED ORDER — SODIUM CHLORIDE 0.9 % IV SOLN: INTRAVENOUS | Status: DC | PRN

## 2024-03-07 MED ORDER — PROPOFOL 10 MG/ML IV BOLUS
INTRAVENOUS | Status: DC | PRN
  Administered 2024-03-07 (×2): 20 mg via INTRAVENOUS
  Administered 2024-03-07: 15 mg via INTRAVENOUS
  Administered 2024-03-07 (×3): 20 mg via INTRAVENOUS
  Administered 2024-03-07: 75 mg via INTRAVENOUS
  Administered 2024-03-07: 10 mg via INTRAVENOUS

## 2024-03-07 MED ORDER — LIDOCAINE HCL (CARDIAC) PF 100 MG/5ML IV SOSY
PREFILLED_SYRINGE | INTRAVENOUS | Status: DC | PRN
  Administered 2024-03-07: 40 mg via INTRAVENOUS

## 2024-03-07 NOTE — H&P (Signed)
 Ruel Kung , MD [address], [address], [place], [place], [address] Phone: [phone] Fax: [phone]  Primary Care Physician:  [facility]   Pre-Procedure History & Physical: HPI:  Sylvia [name] is a 68 y.o. female is here for an colonoscopy.   Past Medical History:  Diagnosis Date   Anemia of chronic disease 05/16/2019   Anxiety    h/o   Arthritis    Chronic kidney disease    Cocaine abuse (HCC)    COPD (chronic obstructive pulmonary disease) (HCC)    Coronary artery disease    Depression    Diabetes mellitus without complication (HCC)    DKA (diabetic ketoacidosis) (HCC)    Elevated troponin    GERD (gastroesophageal reflux disease)    GI bleed    Hypertension    bp under control-off meds since 2019   Ischemic leg     Past Surgical History:  Procedure Laterality Date   ABDOMINAL HYSTERECTOMY     AMPUTATION Right 12/27/2018   Procedure: AMPUTATION BELOW KNEE;  Surgeon: Marea Selinda RAMAN, MD;  Location: [facility];  Service: General;  Laterality: Right;   AMPUTATION Left 11/05/2021   Procedure: AMPUTATION BELOW KNEE;  Surgeon: Marea Selinda RAMAN, MD;  Location: [facility];  Service: Vascular;  Laterality: Left;   AMPUTATION Left 11/04/2022   Procedure: AMPUTATION ABOVE KNEE;  Surgeon: Marea Selinda RAMAN, MD;  Location: [facility];  Service: Vascular;  Laterality: Left;   APPLICATION OF WOUND VAC  10/16/2021   Procedure: APPLICATION OF WOUND VAC;  Surgeon: Marea Selinda RAMAN, MD;  Location: [facility];  Service: Vascular;;   CATARACT EXTRACTION W/PHACO Right 03/27/2020   Procedure: CATARACT EXTRACTION PHACO AND INTRAOCULAR LENS PLACEMENT (IOC) RIGHT DIABETIC 7.54 00:52.5;  Surgeon: Jaye Fallow, MD;  Location: [facility];  Service: Ophthalmology;  Laterality: Right;   CATARACT EXTRACTION W/PHACO Left 05/20/2020   Procedure:  CATARACT EXTRACTION PHACO AND INTRAOCULAR LENS PLACEMENT (IOC) LEFT DIABETIC 4.95 00:37.6;  Surgeon: Jaye Fallow, MD;  Location: [facility];  Service: Ophthalmology;  Laterality: Left;  Diabetic - oral meds COVID + 04-24-20   CHOLECYSTECTOMY     COLONOSCOPY WITH PROPOFOL  N/A 04/16/2019   Procedure: COLONOSCOPY WITH PROPOFOL ;  Surgeon: Janalyn Keene NOVAK, MD;  Location: [facility];  Service: Endoscopy;  Laterality: N/A;   COLONOSCOPY WITH PROPOFOL  N/A 01/16/2020   Procedure: COLONOSCOPY WITH PROPOFOL ;  Surgeon: Janalyn Keene NOVAK, MD;  Location: [facility];  Service: Endoscopy;  Laterality: N/A;   ENDARTERECTOMY FEMORAL Left 10/16/2021   Procedure: ENDARTERECTOMY FEMORAL;  Surgeon: Marea Selinda RAMAN, MD;  Location: [facility];  Service: Vascular;  Laterality: Left;   ESOPHAGOGASTRODUODENOSCOPY (EGD) WITH PROPOFOL  N/A 06/15/2018   Procedure: ESOPHAGOGASTRODUODENOSCOPY (EGD) WITH PROPOFOL ;  Surgeon: Toledo, Ladell POUR, MD;  Location: [facility];  Service: Gastroenterology;  Laterality: N/A;   ESOPHAGOGASTRODUODENOSCOPY (EGD) WITH PROPOFOL  N/A 01/03/2019   Procedure: ESOPHAGOGASTRODUODENOSCOPY (EGD) WITH PROPOFOL ;  Surgeon: Jinny Carmine, MD;  Location: [facility];  Service: Endoscopy;  Laterality: N/A;   ESOPHAGOGASTRODUODENOSCOPY (EGD) WITH PROPOFOL  N/A 09/25/2021   Procedure: ESOPHAGOGASTRODUODENOSCOPY (EGD) WITH PROPOFOL ;  Surgeon: Jinny Carmine, MD;  Location: [facility];  Service: Endoscopy;  Laterality: N/A;   EYE SURGERY Bilateral    GIVENS CAPSULE STUDY  04/16/2019   Procedure: GIVENS CAPSULE STUDY;  Surgeon: Janalyn Keene NOVAK, MD;  Location: [facility];  Service: Endoscopy;;   GIVENS CAPSULE STUDY N/A 06/25/2019   Procedure: GIVENS CAPSULE STUDY;  Surgeon: Kung Ruel, MD;  Location: [facility];  Service: Gastroenterology;  Laterality: N/A;   INCISION AND DRAINAGE OF WOUND Left 02/24/2022   Procedure: LEFT BKA IRRIGATION AND DEBRIDEMENT WOUND;   Surgeon: Marea Selinda RAMAN, MD;  Location: [facility];  Service: Vascular;  Laterality: Left;   LOWER EXTREMITY ANGIOGRAPHY Right 08/21/2018   Procedure: LOWER EXTREMITY ANGIOGRAPHY;  Surgeon: Marea Selinda RAMAN, MD;  Location: [facility];  Service: Cardiovascular;  Laterality: Right;   LOWER EXTREMITY ANGIOGRAPHY Left 08/28/2018   Procedure: LOWER EXTREMITY ANGIOGRAPHY;  Surgeon: Marea Selinda RAMAN, MD;  Location: [facility];  Service: Cardiovascular;  Laterality: Left;   LOWER EXTREMITY ANGIOGRAPHY Right 08/28/2018   Procedure: Lower Extremity Angiography;  Surgeon: Marea Selinda RAMAN, MD;  Location: [facility];  Service: Cardiovascular;  Laterality: Right;   LOWER EXTREMITY ANGIOGRAPHY Right 12/18/2018   Procedure: Lower Extremity Angiography;  Surgeon: Marea Selinda RAMAN, MD;  Location: [facility];  Service: Cardiovascular;  Laterality: Right;   LOWER EXTREMITY ANGIOGRAPHY Right 12/18/2018   Procedure: Lower Extremity Angiography;  Surgeon: Marea Selinda RAMAN, MD;  Location: [facility];  Service: Cardiovascular;  Laterality: Right;   LOWER EXTREMITY ANGIOGRAPHY Left 12/21/2018   Procedure: Lower Extremity Angiography;  Surgeon: Marea Selinda RAMAN, MD;  Location: [facility];  Service: Cardiovascular;  Laterality: Left;   LOWER EXTREMITY ANGIOGRAPHY Right 12/21/2018   Procedure: Lower Extremity Angiography;  Surgeon: Marea Selinda RAMAN, MD;  Location: [facility];  Service: Cardiovascular;  Laterality: Right;   LOWER EXTREMITY ANGIOGRAPHY Left 12/25/2020   Procedure: LOWER EXTREMITY ANGIOGRAPHY;  Surgeon: Marea Selinda RAMAN, MD;  Location: [facility];  Service: Cardiovascular;  Laterality: Left;   LOWER EXTREMITY ANGIOGRAPHY Left 09/21/2021   Procedure: Lower Extremity Angiography;  Surgeon: Marea Selinda RAMAN, MD;  Location: [facility];  Service: Cardiovascular;  Laterality: Left;   LOWER EXTREMITY ANGIOGRAPHY Left 10/13/2021   Procedure: Lower Extremity  Angiography;  Surgeon: Jama Cordella MATSU, MD;  Location: [facility];  Service: Cardiovascular;  Laterality: Left;   LOWER EXTREMITY ANGIOGRAPHY Left 10/14/2021   Procedure: Lower Extremity Angiography;  Surgeon: Marea Selinda RAMAN, MD;  Location: [facility];  Service: Cardiovascular;  Laterality: Left;   LOWER EXTREMITY ANGIOGRAPHY Left 11/02/2021   Procedure: Lower Extremity Angiography;  Surgeon: Marea Selinda RAMAN, MD;  Location: [facility];  Service: Cardiovascular;  Laterality: Left;   LOWER EXTREMITY INTERVENTION N/A 12/22/2018   Procedure: LOWER EXTREMITY INTERVENTION;  Surgeon: Marea Selinda RAMAN, MD;  Location: [facility];  Service: Cardiovascular;  Laterality: N/A;   LOWER EXTREMITY INTERVENTION Left 12/26/2020   Procedure: LOWER EXTREMITY INTERVENTION;  Surgeon: Marea Selinda RAMAN, MD;  Location: [facility];  Service: Cardiovascular;  Laterality: Left;   LOWER EXTREMITY INTERVENTION N/A 09/22/2021   Procedure: LOWER EXTREMITY INTERVENTION;  Surgeon: Marea Selinda RAMAN, MD;  Location: [facility];  Service: Cardiovascular;  Laterality: N/A;    Prior to Admission medications   Medication Sig Start Date End Date Taking? Authorizing Provider  vitamin B-12 (CYANOCOBALAMIN ) 500 MCG tablet Take 1 tablet (500 mcg total) by mouth daily. 11/26/19  Yes Babara Call, MD  acetaminophen  (TYLENOL ) 500 MG tablet Take 500 mg by mouth every 6 (six) hours as needed.    [provider]  albuterol  (VENTOLIN  HFA) 108 (90 Base) MCG/ACT inhaler Inhale 2 puffs into the lungs every 6 (six) hours as needed for wheezing or shortness of breath. 02/12/24   Floy,  Guadalupe, MD  apixaban  (ELIQUIS ) 5 MG TABS tablet Take 1 tablet (5 mg total) by mouth 2 (two) times daily. 10/20/21   Serene Gaile ORN, MD  atorvastatin  (LIPITOR) 80 MG tablet Take 80 mg by mouth daily. 10/20/22   [provider]  cetirizine (ZYRTEC) 10 MG tablet Take 10 mg by mouth daily as needed for allergies.     [provider]  cyclobenzaprine  (FLEXERIL ) 10 MG tablet Take 1 tablet (10 mg total) by mouth 3 (three) times daily as needed for muscle spasms. Take 1/2 a tablet as needed for pain 02/08/23   Marea Selinda RAMAN, MD  DULoxetine  (CYMBALTA ) 60 MG capsule Take 1 capsule (60 mg total) by mouth daily. 11/20/21   Brown, Fallon E, NP  ferrous sulfate  325 (65 FE) MG tablet Take 325 mg by mouth daily.     [provider]  gabapentin  (NEURONTIN ) 300 MG capsule TAKE 3 CAPSULES BY MOUTH IN THE  MORNING AND 3 CAPSULES BY MOUTH  IN THE EVENING 11/30/21   Brown, Fallon E, NP  hydroxychloroquine  (PLAQUENIL ) 200 MG tablet Take 200 mg by mouth daily. 01/22/21   [provider]  JARDIANCE  25 MG TABS tablet Take 25 mg by mouth daily. 03/15/23   [provider]  lisinopril  (ZESTRIL ) 40 MG tablet Take 40 mg by mouth daily. 03/12/23   [provider]  methotrexate (RHEUMATREX) 2.5 MG tablet Take 2.5 mg by mouth once a week. 01/06/23   [provider]  metoprolol  succinate (TOPROL -XL) 50 MG 24 hr tablet Take 1 tablet (50 mg total) by mouth daily. Take with or immediately following a meal. 05/26/23   Darliss Rogue, MD  Multiple Vitamins-Minerals (WOMENS MULTIVITAMIN PO) Take 1 tablet by mouth daily.    [provider]  nicotine  (NICODERM CQ  - DOSED IN MG/24 HOURS) 21 mg/24hr patch Place 1 patch (21 mg total) onto the skin daily. 05/25/23   Darliss Rogue, MD  pantoprazole  (PROTONIX ) 40 MG tablet Take 1 tablet (40 mg total) by mouth daily. 09/29/21   Brown, Fallon E, NP  VICTOZA  18 MG/3ML SOPN Inject 0.6 mg into the skin daily. 01/13/22   [provider]  VOLTAREN ARTHRITIS PAIN 1 % GEL Apply 2 g topically 4 (four) times daily as needed. 10/20/22   [provider]    Allergies as of 02/16/2024 - Review Complete 02/12/2024  Allergen Reaction Noted   Vancomycin  Rash 09/21/2021   Hydrocodone  Rash 09/14/2021   Metformin and related Diarrhea 06/04/2019    Penicillins Hives 11/14/2015   Tramadol  Itching 12/14/2018    Family History  Problem Relation Age of Onset   Heart attack Mother    Breast cancer Mother 22    Social History   Socioeconomic History   Marital status: Divorced    Spouse name: Not on file   Number of children: 2   Years of education: Not on file   Highest education level: Not on file  Occupational History   Not on file  Tobacco Use   Smoking status: Every Day    Current packs/day: 0.25    Average packs/day: 0.3 packs/day for 45.0 years (11.3 ttl pk-yrs)    Types: Cigarettes   Smokeless tobacco: Never   Tobacco comments:    Half a pack a day   Vaping Use   Vaping status: Never Used  Substance and Sexual Activity   Alcohol use: No   Drug use: Not Currently    Types: Cocaine    Comment: last used  in April 2021 per patient-last 2 drug tests have been negative for cocaine   Sexual activity: Not Currently  Other Topics Concern   Not on file  Social History Narrative   ** Merged History Encounter **       5 grandchildren and 5 great grandchildren  2 grandchildren live with Pt. (25 & 67 y.o.)   Social Drivers of Corporate Investment Banker Strain: Low Risk  (02/13/2024)   Received from [facility]   Overall Financial Resource Strain (CARDIA)    Difficulty of Paying Living Expenses: Not hard at all  Food Insecurity: No Food Insecurity (02/13/2024)   Received from [facility]   Hunger Vital Sign    Within the past 12 months, you worried that your food would run out before you got the money to buy more.: Never true    Within the past 12 months, the food you bought just didn't last and you didn't have money to get more.: Never true  Transportation Needs: No Transportation Needs (02/13/2024)   Received from [facility] - Transportation    In the past 12 months, has lack of transportation kept you from medical appointments or from getting  medications?: No    Lack of Transportation (Non-Medical): No  Physical Activity: Insufficiently Active (06/14/2018)   Exercise Vital Sign    Days of Exercise per Week: 4 days    Minutes of Exercise per Session: 20 min  Stress: No Stress Concern Present (06/14/2018)   [employer] of Occupational Health - Occupational Stress Questionnaire    Feeling of Stress : Only a little  Social Connections: Unknown (06/14/2018)   Social Connection and Isolation Panel    Frequency of Communication with Friends and Family: Patient declined    Frequency of Social Gatherings with Friends and Family: Patient declined    Attends Religious Services: Patient declined    Database Administrator or Organizations: Patient declined    Attends Banker Meetings: Patient declined    Marital Status: Patient declined  Intimate Partner Violence: Not At Risk (11/04/2022)   Humiliation, Afraid, Rape, and Kick questionnaire    Fear of Current or Ex-Partner: No    Emotionally Abused: No    Physically Abused: No    Sexually Abused: No    Review of Systems: See HPI, otherwise negative ROS  Physical Exam: BP 139/76   Pulse 96   Temp (!) 96.5 F (35.8 C) (Temporal)   Resp 16   Wt 59.9 kg   SpO2 100%   BMI 25.78 kg/m  General:   Alert,  pleasant and cooperative in NAD Head:  Normocephalic and atraumatic. Neck:  Supple; no masses or thyromegaly. Lungs:  Clear throughout to auscultation, normal respiratory effort.    Heart:  +S1, +S2, Regular rate and rhythm, No edema. Abdomen:  Soft, nontender and nondistended. Normal bowel sounds, without guarding, and without rebound.   Neurologic:  Alert and  oriented x4;  grossly normal neurologically.  Impression/Plan: Porschea Iannuzzi is here for an colonoscopy to be performed for Screening colonoscopy , cologuard positive Risks, benefits, limitations, and alternatives regarding  colonoscopy have been reviewed with the patient.  Questions have been  answered.  All parties agreeable.   Ruel Kung, MD  03/07/2024, 10:12 AM

## 2024-03-07 NOTE — Transfer of Care (Signed)
 Immediate Anesthesia Transfer of Care Note  Patient: Sylvia [name]  Procedure(s) Performed: COLONOSCOPY POLYPECTOMY, INTESTINE  Patient Location: PACU  Anesthesia Type:MAC  Level of Consciousness: drowsy  Airway & Oxygen Therapy: Patient Spontanous Breathing  Post-op Assessment: Report given to RN  Post vital signs: Reviewed  Last Vitals:  Vitals Value Taken Time  BP 121/76 03/07/24 10:40  Temp    Pulse    Resp 19 03/07/24 10:40  SpO2    Vitals shown include unfiled device data.  Last Pain:  Vitals:   03/07/24 0945  TempSrc: Temporal  PainSc: 0-No pain         Complications: No notable events documented.

## 2024-03-07 NOTE — Op Note (Signed)
 [facility] Patient Name: Sylvia [name] Procedure Date: 03/07/2024 10:10 AM MRN: [id number] Account #: [id number] Date of Birth: [birth year] Admit Type: Outpatient Age: 68 Room: [facility] ENDO ROOM 3 Gender: Female Note Status: Finalized Instrument Name: Colon Scope [id number] Procedure:             Colonoscopy Indications:           Screening for colorectal malignant neoplasm due to                         positive Cologuard test Providers:             Ruel Kung MD, MD Referring MD:          No Local Md, MD (Referring MD) Medicines:             Monitored Anesthesia Care Complications:         No immediate complications. Procedure:             Pre-Anesthesia Assessment:                        - Prior to the procedure, a History and Physical was                         performed, and patient medications, allergies and                         sensitivities were reviewed. The patient's tolerance                         of previous anesthesia was reviewed.                        - The risks and benefits of the procedure and the                         sedation options and risks were discussed with the                         patient. All questions were answered and informed                         consent was obtained.                        - ASA Grade Assessment: II - A patient with mild                         systemic disease.                        After obtaining informed consent, the colonoscope was                         passed under direct vision. Throughout the procedure,                         the patient's blood pressure, pulse, and oxygen                         saturations  were monitored continuously. The                         Colonoscope was introduced through the anus and                         advanced to the the cecum, identified by the                         appendiceal orifice. The colonoscopy was performed                          with ease. The patient tolerated the procedure well.                         The quality of the bowel preparation was excellent.                         The ileocecal valve, appendiceal orifice, and rectum                         were photographed. Findings:      The perianal and digital rectal examinations were normal.      Multiple medium-mouthed diverticula were found in the left colon.      Two sessile polyps were found in the descending colon and transverse       colon. The polyps were 8 to 9 mm in size. These polyps were removed with       a hot snare. Resection and retrieval were complete.      Three sessile polyps were found in the transverse colon. The polyps were       5 to 7 mm in size. These polyps were removed with a cold snare.       Resection and retrieval were complete.      Two sessile polyps were found in the ascending colon. The polyps were 5       to 6 mm in size. These polyps were removed with a cold snare. Resection       and retrieval were complete.      Two sessile polyps were found in the descending colon. The polyps were 5       to 7 mm in size. These polyps were removed with a cold snare. Resection       and retrieval were complete.      The exam was otherwise without abnormality on direct and retroflexion       views.      Two sessile polyps were found in the ascending colon. The polyps were 8       to 10 mm in size. These polyps were removed with a hot snare. Resection       and retrieval were complete. Impression:            - Diverticulosis in the left colon.                        - Two 8 to 9 mm polyps in the descending colon and in                         the transverse colon, removed with a hot snare.  Resected and retrieved.                        - Three 5 to 7 mm polyps in the transverse colon,                         removed with a cold snare. Resected and retrieved.                        - Two 5 to 6 mm polyps, removed  with a cold snare.                         Resected and retrieved.                        - Two 5 to 7 mm polyps in the descending colon,                         removed with a cold snare. Resected and retrieved.                        - The examination was otherwise normal on direct and                         retroflexion views. Recommendation:        - Discharge patient to home (with escort).                        - Resume previous diet.                        - Continue present medications.                        - Discharge patient to home (with escort).                        - Resume previous diet.                        - Continue present medications.                        - Await pathology results.                        - Repeat colonoscopy in 1 year for surveillance. Procedure Code(s):     --- Professional ---                        [phone], Colonoscopy, flexible; with removal of                         tumor(s), polyp(s), or other lesion(s) by snare                         technique Diagnosis Code(s):     --- Professional ---                        Z12.11, Encounter for screening for malignant neoplasm  of colon                        R19.5, Other fecal abnormalities                        D12.3, Benign neoplasm of transverse colon (hepatic                         flexure or splenic flexure)                        D12.4, Benign neoplasm of descending colon                        K57.30, Diverticulosis of large intestine without                         perforation or abscess without bleeding CPT copyright 2022 American Medical Association. All rights reserved. The codes documented in this report are preliminary and upon coder review may  be revised to meet current compliance requirements. Ruel Kung, MD Ruel Kung MD, MD 03/07/2024 10:42:34 AM This report has been signed electronically. Number of Addenda: 0 Note Initiated On: 03/07/2024 10:10  AM Scope Withdrawal Time: 0 hours 13 minutes 21 seconds  Total Procedure Duration: 0 hours 16 minutes 52 seconds  Estimated Blood Loss:  Estimated blood loss: none.      [facility]

## 2024-03-07 NOTE — Anesthesia Postprocedure Evaluation (Signed)
 Anesthesia Post Note  Patient: Sylvia [name]  Procedure(s) Performed: COLONOSCOPY POLYPECTOMY, INTESTINE  Patient location during evaluation: Endoscopy Anesthesia Type: General Level of consciousness: awake and alert Pain management: pain level controlled Vital Signs Assessment: post-procedure vital signs reviewed and stable Respiratory status: spontaneous breathing, nonlabored ventilation and respiratory function stable Cardiovascular status: blood pressure returned to baseline and stable Postop Assessment: no apparent nausea or vomiting Anesthetic complications: no   No notable events documented.   Last Vitals:  Vitals:   03/07/24 1050 03/07/24 1100  BP: 108/74 133/75  Pulse: 83   Resp: 16 16  Temp:    SpO2: 100%     Last Pain:  Vitals:   03/07/24 1100  TempSrc:   PainSc: 0-No pain                 Fairy POUR Lyle Leisner

## 2024-03-07 NOTE — Anesthesia Preprocedure Evaluation (Signed)
 Anesthesia Evaluation  Patient identified by MRN, date of birth, ID band Patient awake    Reviewed: Allergy & Precautions, NPO status , Patient's Chart, lab work & pertinent test results  History of Anesthesia Complications Negative for: history of anesthetic complications  Airway Mallampati: III  TM Distance: >3 FB Neck ROM: full    Dental  (+) Missing   Pulmonary neg shortness of breath, COPD, Current Smoker   Pulmonary exam normal        Cardiovascular Exercise Tolerance: Good hypertension, (-) angina + CAD and + Peripheral Vascular Disease  Normal cardiovascular exam     Neuro/Psych  Neuromuscular disease  negative psych ROS   GI/Hepatic Neg liver ROS, PUD,GERD  Controlled,,  Endo/Other  negative endocrine ROSdiabetes, Type 2    Renal/GU Renal disease  negative genitourinary   Musculoskeletal   Abdominal   Peds  Hematology negative hematology ROS (+)   Anesthesia Other Findings Past Medical History: 05/16/2019: Anemia of chronic disease No date: Anxiety     Comment:  h/o No date: Arthritis No date: Chronic kidney disease No date: Cocaine abuse (HCC) No date: COPD (chronic obstructive pulmonary disease) (HCC) No date: Coronary artery disease No date: Depression No date: Diabetes mellitus without complication (HCC) No date: DKA (diabetic ketoacidosis) (HCC) No date: Elevated troponin No date: GERD (gastroesophageal reflux disease) No date: GI bleed No date: Hypertension     Comment:  bp under control-off meds since 2019 No date: Ischemic leg  Past Surgical History: No date: ABDOMINAL HYSTERECTOMY 12/27/2018: AMPUTATION; Right     Comment:  Procedure: AMPUTATION BELOW KNEE;  Surgeon: Marea Selinda RAMAN, MD;  Location: [facility];  Service: General;                Laterality: Right; 11/05/2021: AMPUTATION; Left     Comment:  Procedure: AMPUTATION BELOW KNEE;  Surgeon: Marea Selinda RAMAN, MD;  Location: [facility];  Service: Vascular;                Laterality: Left; 11/04/2022: AMPUTATION; Left     Comment:  Procedure: AMPUTATION ABOVE KNEE;  Surgeon: Marea Selinda RAMAN, MD;  Location: [facility];  Service: Vascular;                Laterality: Left; 10/16/2021: APPLICATION OF WOUND VAC     Comment:  Procedure: APPLICATION OF WOUND VAC;  Surgeon: Marea Selinda RAMAN, MD;  Location: [facility];  Service: Vascular;; 03/27/2020: CATARACT EXTRACTION W/PHACO; Right     Comment:  Procedure: CATARACT EXTRACTION PHACO AND INTRAOCULAR               LENS PLACEMENT (IOC) RIGHT DIABETIC 7.54 00:52.5;                Surgeon: Jaye Fallow, MD;  Location: [facility] SUR[facility]              CNTR;  Service: Ophthalmology;  Laterality: Right; 05/20/2020: CATARACT EXTRACTION W/PHACO; Left     Comment:  Procedure: CATARACT EXTRACTION PHACO AND INTRAOCULAR               LENS PLACEMENT (IOC) LEFT DIABETIC 4.95 00:37.6;  Surgeon: Jaye Fallow, MD;  Location: [facility] SURGERY              CNTR;  Service: Ophthalmology;  Laterality: Left;                Diabetic - oral meds COVID + 04-24-20 No date: CHOLECYSTECTOMY 04/16/2019: COLONOSCOPY WITH PROPOFOL ; N/A     Comment:  Procedure: COLONOSCOPY WITH PROPOFOL ;  Surgeon:               Janalyn Keene NOVAK, MD;  Location: [facility];                Service: Endoscopy;  Laterality: N/A; 01/16/2020: COLONOSCOPY WITH PROPOFOL ; N/A     Comment:  Procedure: COLONOSCOPY WITH PROPOFOL ;  Surgeon:               Janalyn Keene NOVAK, MD;  Location: [facility];                Service: Endoscopy;  Laterality: N/A; 10/16/2021: ENDARTERECTOMY FEMORAL; Left     Comment:  Procedure: ENDARTERECTOMY FEMORAL;  Surgeon: Marea Selinda RAMAN, MD;  Location: [facility];  Service: Vascular;                Laterality: Left; 06/15/2018: ESOPHAGOGASTRODUODENOSCOPY (EGD) WITH PROPOFOL ; N/A     Comment:  Procedure:  ESOPHAGOGASTRODUODENOSCOPY (EGD) WITH               PROPOFOL ;  Surgeon: Toledo, Ladell POUR, MD;  Location:               [facility];  Service: Gastroenterology;  Laterality:               N/A; 01/03/2019: ESOPHAGOGASTRODUODENOSCOPY (EGD) WITH PROPOFOL ; N/A     Comment:  Procedure: ESOPHAGOGASTRODUODENOSCOPY (EGD) WITH               PROPOFOL ;  Surgeon: Jinny Carmine, MD;  Location: [facility]               ENDOSCOPY;  Service: Endoscopy;  Laterality: N/A; 09/25/2021: ESOPHAGOGASTRODUODENOSCOPY (EGD) WITH PROPOFOL ; N/A     Comment:  Procedure: ESOPHAGOGASTRODUODENOSCOPY (EGD) WITH               PROPOFOL ;  Surgeon: Jinny Carmine, MD;  Location: [facility]               ENDOSCOPY;  Service: Endoscopy;  Laterality: N/A; No date: EYE SURGERY; Bilateral 04/16/2019: GIVENS CAPSULE STUDY     Comment:  Procedure: GIVENS CAPSULE STUDY;  Surgeon: Janalyn Keene NOVAK, MD;  Location: [facility];  Service:               Endoscopy;; 06/25/2019: GIVENS CAPSULE STUDY; N/A     Comment:  Procedure: GIVENS CAPSULE STUDY;  Surgeon: Therisa Bi,               MD;  Location: [facility] ENDOSCOPY;  Service:               Gastroenterology;  Laterality: N/A; 02/24/2022: INCISION AND DRAINAGE OF WOUND; Left     Comment:  Procedure: LEFT BKA IRRIGATION AND DEBRIDEMENT WOUND;                Surgeon: Marea Selinda RAMAN, MD;  Location: [facility];  Service:  Vascular;  Laterality: Left; 08/21/2018: LOWER EXTREMITY ANGIOGRAPHY; Right     Comment:  Procedure: LOWER EXTREMITY ANGIOGRAPHY;  Surgeon: Marea Selinda RAMAN, MD;  Location: [facility];  Service:               Cardiovascular;  Laterality: Right; 08/28/2018: LOWER EXTREMITY ANGIOGRAPHY; Left     Comment:  Procedure: LOWER EXTREMITY ANGIOGRAPHY;  Surgeon: Marea Selinda RAMAN, MD;  Location: [facility];  Service:               Cardiovascular;  Laterality: Left; 08/28/2018: LOWER EXTREMITY ANGIOGRAPHY; Right     Comment:   Procedure: Lower Extremity Angiography;  Surgeon: Marea Selinda RAMAN, MD;  Location: [facility];  Service:               Cardiovascular;  Laterality: Right; 12/18/2018: LOWER EXTREMITY ANGIOGRAPHY; Right     Comment:  Procedure: Lower Extremity Angiography;  Surgeon: Marea Selinda RAMAN, MD;  Location: [facility];  Service:               Cardiovascular;  Laterality: Right; 12/18/2018: LOWER EXTREMITY ANGIOGRAPHY; Right     Comment:  Procedure: Lower Extremity Angiography;  Surgeon: Marea Selinda RAMAN, MD;  Location: [facility];  Service:               Cardiovascular;  Laterality: Right; 12/21/2018: LOWER EXTREMITY ANGIOGRAPHY; Left     Comment:  Procedure: Lower Extremity Angiography;  Surgeon: Marea Selinda RAMAN, MD;  Location: [facility];  Service:               Cardiovascular;  Laterality: Left; 12/21/2018: LOWER EXTREMITY ANGIOGRAPHY; Right     Comment:  Procedure: Lower Extremity Angiography;  Surgeon: Marea Selinda RAMAN, MD;  Location: [facility];  Service:               Cardiovascular;  Laterality: Right; 12/25/2020: LOWER EXTREMITY ANGIOGRAPHY; Left     Comment:  Procedure: LOWER EXTREMITY ANGIOGRAPHY;  Surgeon: Marea Selinda RAMAN, MD;  Location: [facility];  Service:               Cardiovascular;  Laterality: Left; 09/21/2021: LOWER EXTREMITY ANGIOGRAPHY; Left     Comment:  Procedure: Lower Extremity Angiography;  Surgeon: Marea Selinda RAMAN, MD;  Location: [facility];  Service:               Cardiovascular;  Laterality: Left; 10/13/2021: LOWER EXTREMITY ANGIOGRAPHY; Left     Comment:  Procedure: Lower Extremity Angiography;  Surgeon:               Jama Cordella MATSU, MD;  Location: [facility];  Service: Cardiovascular;  Laterality: Left; 10/14/2021: LOWER EXTREMITY ANGIOGRAPHY; Left     Comment:  Procedure: Lower Extremity  Angiography;  Surgeon: Marea Selinda RAMAN, MD;  Location: [facility];  Service:               Cardiovascular;  Laterality: Left; 11/02/2021: LOWER EXTREMITY ANGIOGRAPHY; Left     Comment:  Procedure: Lower Extremity Angiography;  Surgeon: Marea Selinda RAMAN, MD;  Location: [facility];  Service:               Cardiovascular;  Laterality: Left; 12/22/2018: LOWER EXTREMITY INTERVENTION; N/A     Comment:  Procedure: LOWER EXTREMITY INTERVENTION;  Surgeon: Marea Selinda RAMAN, MD;  Location: [facility];  Service:               Cardiovascular;  Laterality: N/A; 12/26/2020: LOWER EXTREMITY INTERVENTION; Left     Comment:  Procedure: LOWER EXTREMITY INTERVENTION;  Surgeon: Marea Selinda RAMAN, MD;  Location: [facility];  Service:               Cardiovascular;  Laterality: Left; 09/22/2021: LOWER EXTREMITY INTERVENTION; N/A     Comment:  Procedure: LOWER EXTREMITY INTERVENTION;  Surgeon: Marea Selinda RAMAN, MD;  Location: [facility];  Service:               Cardiovascular;  Laterality: N/A;     Reproductive/Obstetrics negative OB ROS                              Anesthesia Physical Anesthesia Plan  ASA: 3  Anesthesia Plan: General   Post-op Pain Management:    Induction: Intravenous  PONV Risk Score and Plan: Propofol  infusion and TIVA  Airway Management Planned: Natural Airway and Nasal Cannula  Additional Equipment:   Intra-op Plan:   Post-operative Plan:   Informed Consent: I have reviewed the patients History and Physical, chart, labs and discussed the procedure including the risks, benefits and alternatives for the proposed anesthesia with the patient or authorized representative who has indicated his/her understanding and acceptance.     Dental Advisory Given  Plan Discussed with: Anesthesiologist, CRNA and Surgeon  Anesthesia Plan Comments: (Patient  consented for risks of anesthesia including but not limited to:  - adverse reactions to medications - risk of airway placement if required - damage to eyes, teeth, lips or other oral mucosa - nerve damage due to positioning  - sore throat or hoarseness - Damage to heart, brain, nerves, lungs, other parts of body or loss of life  Patient voiced understanding and assent.)        Anesthesia Quick Evaluation

## 2024-03-08 LAB — SURGICAL PATHOLOGY

## 2024-03-13 ENCOUNTER — Encounter: Payer: Self-pay | Admitting: Oncology

## 2024-03-20 NOTE — Telephone Encounter (Signed)
 completed

## 2024-03-28 ENCOUNTER — Inpatient Hospital Stay: Attending: Oncology

## 2024-03-28 ENCOUNTER — Other Ambulatory Visit: Payer: Self-pay | Admitting: Licensed Clinical Social Worker

## 2024-03-28 ENCOUNTER — Inpatient Hospital Stay: Attending: Oncology | Admitting: Licensed Clinical Social Worker

## 2024-03-28 DIAGNOSIS — Z860100 Personal history of colon polyps, unspecified: Secondary | ICD-10-CM

## 2024-03-28 DIAGNOSIS — Z1379 Encounter for other screening for genetic and chromosomal anomalies: Secondary | ICD-10-CM

## 2024-03-28 DIAGNOSIS — Z803 Family history of malignant neoplasm of breast: Secondary | ICD-10-CM

## 2024-03-28 LAB — GENETIC SCREENING ORDER

## 2024-03-28 NOTE — Progress Notes (Signed)
 REFERRING PROVIDER: Therisa Bi, MD [address] [place],  [place] [address]  PRIMARY PROVIDER:  [facility]  PRIMARY REASON FOR VISIT:  1. Personal history of colon polyps, unspecified   2. Family history of breast cancer      HISTORY OF PRESENT ILLNESS:   Ms. Leath, a 68 y.o. female, was seen for a [place] cancer genetics consultation at the request of Dr. Therisa due to a personal history of colon polyps.  Ms. Fait presents to clinic today to discuss the possibility of a hereditary predisposition to cancer, genetic testing, and to further clarify her future cancer risks, as well as potential cancer risks for family members.   CANCER HISTORY:  Ms. Nawabi is a 68 y.o. female with no personal history of cancer.    RELEVANT MEDICAL HISTORY:  Menarche was at age 37.  First live birth at age 31.  Ovaries intact: yes.  Hysterectomy: yes.  Menopausal status: postmenopausal.  HRT use: 0 years. Colonoscopy: yes, 2020 - 6 polyps, tubular adenomas;  2025 - 9 polyps, tubular adenomas and hyperplastic Mammogram within the last year: no. Number of breast biopsies: 0.  Past Medical History:  Diagnosis Date   Anemia of chronic disease 05/16/2019   Anxiety    h/o   Arthritis    Chronic kidney disease    Cocaine abuse (HCC)    COPD (chronic obstructive pulmonary disease) (HCC)    Coronary artery disease    Depression    Diabetes mellitus without complication (HCC)    DKA (diabetic ketoacidosis) (HCC)    Elevated troponin    GERD (gastroesophageal reflux disease)    GI bleed    Hypertension    bp under control-off meds since 2019   Ischemic leg     Past Surgical History:  Procedure Laterality Date   ABDOMINAL HYSTERECTOMY     AMPUTATION Right 12/27/2018   Procedure: AMPUTATION BELOW KNEE;  Surgeon: Marea Selinda RAMAN, MD;  Location: [facility];  Service: General;  Laterality: Right;   AMPUTATION Left 11/05/2021   Procedure: AMPUTATION BELOW  KNEE;  Surgeon: Marea Selinda RAMAN, MD;  Location: [facility];  Service: Vascular;  Laterality: Left;   AMPUTATION Left 11/04/2022   Procedure: AMPUTATION ABOVE KNEE;  Surgeon: Marea Selinda RAMAN, MD;  Location: [facility];  Service: Vascular;  Laterality: Left;   APPLICATION OF WOUND VAC  10/16/2021   Procedure: APPLICATION OF WOUND VAC;  Surgeon: Marea Selinda RAMAN, MD;  Location: [facility];  Service: Vascular;;   CATARACT EXTRACTION W/PHACO Right 03/27/2020   Procedure: CATARACT EXTRACTION PHACO AND INTRAOCULAR LENS PLACEMENT (IOC) RIGHT DIABETIC 7.54 00:52.5;  Surgeon: Jaye Fallow, MD;  Location: [facility]GERY CN[facility];  Service: Ophthalmology;  Laterality: Right;   CATARACT EXTRACTION W/PHACO Left 05/20/2020   Procedure: CATARACT EXTRACTION PHACO AND INTRAOCULAR LENS PLACEMENT (IOC) LEFT DIABETIC 4.95 00:37.6;  Surgeon: Jaye Fallow, MD;  Location: [facility] SURGERY CNTR;  Service: Ophthalmology;  Laterality: Left;  Diabetic - oral meds COVID + 04-24-20   CHOLECYSTECTOMY     COLONOSCOPY N/A 03/07/2024   Procedure: COLONOSCOPY;  Surgeon: Therisa Bi, MD;  Location: [facility] ENDOSCOPY;  Service: Gastroenterology;  Laterality: N/A;   COLONOSCOPY WITH PROPOFOL  N/A 04/16/2019   Procedure: COLONOSCOPY WITH PROPOFOL ;  Surgeon: Janalyn Keene NOVAK, MD;  Location: [facility];  Service: Endoscopy;  Laterality: N/A;   COLONOSCOPY WITH PROPOFOL  N/A 01/16/2020   Procedure: COLONOSCOPY WITH PROPOFOL ;  Surgeon: Janalyn Keene NOVAK, MD;  Location: [facility];  Service: Endoscopy;  Laterality: N/A;   ENDARTERECTOMY FEMORAL Left 10/16/2021   Procedure: ENDARTERECTOMY FEMORAL;  Surgeon: Marea Selinda RAMAN, MD;  Location: [facility];  Service: Vascular;  Laterality: Left;   ESOPHAGOGASTRODUODENOSCOPY (EGD) WITH PROPOFOL  N/A 06/15/2018   Procedure: ESOPHAGOGASTRODUODENOSCOPY (EGD) WITH PROPOFOL ;  Surgeon: Toledo, Ladell POUR, MD;  Location: [facility];  Service: Gastroenterology;  Laterality: N/A;   ESOPHAGOGASTRODUODENOSCOPY (EGD)  WITH PROPOFOL  N/A 01/03/2019   Procedure: ESOPHAGOGASTRODUODENOSCOPY (EGD) WITH PROPOFOL ;  Surgeon: Jinny Carmine, MD;  Location: [facility];  Service: Endoscopy;  Laterality: N/A;   ESOPHAGOGASTRODUODENOSCOPY (EGD) WITH PROPOFOL  N/A 09/25/2021   Procedure: ESOPHAGOGASTRODUODENOSCOPY (EGD) WITH PROPOFOL ;  Surgeon: Jinny Carmine, MD;  Location: [facility];  Service: Endoscopy;  Laterality: N/A;   EYE SURGERY Bilateral    GIVENS CAPSULE STUDY  04/16/2019   Procedure: GIVENS CAPSULE STUDY;  Surgeon: Janalyn Keene NOVAK, MD;  Location: [facility];  Service: Endoscopy;;   GIVENS CAPSULE STUDY N/A 06/25/2019   Procedure: GIVENS CAPSULE STUDY;  Surgeon: Therisa Bi, MD;  Location: [facility] ENDOSCOPY;  Service: Gastroenterology;  Laterality: N/A;   INCISION AND DRAINAGE OF WOUND Left 02/24/2022   Procedure: LEFT BKA IRRIGATION AND DEBRIDEMENT WOUND;  Surgeon: Marea Selinda RAMAN, MD;  Location: [facility];  Service: Vascular;  Laterality: Left;   LOWER EXTREMITY ANGIOGRAPHY Right 08/21/2018   Procedure: LOWER EXTREMITY ANGIOGRAPHY;  Surgeon: Marea Selinda RAMAN, MD;  Location: [facility];  Service: Cardiovascular;  Laterality: Right;   LOWER EXTREMITY ANGIOGRAPHY Left 08/28/2018   Procedure: LOWER EXTREMITY ANGIOGRAPHY;  Surgeon: Marea Selinda RAMAN, MD;  Location: [facility];  Service: Cardiovascular;  Laterality: Left;   LOWER EXTREMITY ANGIOGRAPHY Right 08/28/2018   Procedure: Lower Extremity Angiography;  Surgeon: Marea Selinda RAMAN, MD;  Location: [facility];  Service: Cardiovascular;  Laterality: Right;   LOWER EXTREMITY ANGIOGRAPHY Right 12/18/2018   Procedure: Lower Extremity Angiography;  Surgeon: Marea Selinda RAMAN, MD;  Location: [facility];  Service: Cardiovascular;  Laterality: Right;   LOWER EXTREMITY ANGIOGRAPHY Right 12/18/2018   Procedure: Lower Extremity Angiography;  Surgeon: Marea Selinda RAMAN, MD;  Location: [facility];  Service: Cardiovascular;  Laterality: Right;    LOWER EXTREMITY ANGIOGRAPHY Left 12/21/2018   Procedure: Lower Extremity Angiography;  Surgeon: Marea Selinda RAMAN, MD;  Location: [facility];  Service: Cardiovascular;  Laterality: Left;   LOWER EXTREMITY ANGIOGRAPHY Right 12/21/2018   Procedure: Lower Extremity Angiography;  Surgeon: Marea Selinda RAMAN, MD;  Location: [facility];  Service: Cardiovascular;  Laterality: Right;   LOWER EXTREMITY ANGIOGRAPHY Left 12/25/2020   Procedure: LOWER EXTREMITY ANGIOGRAPHY;  Surgeon: Marea Selinda RAMAN, MD;  Location: [facility];  Service: Cardiovascular;  Laterality: Left;   LOWER EXTREMITY ANGIOGRAPHY Left 09/21/2021   Procedure: Lower Extremity Angiography;  Surgeon: Marea Selinda RAMAN, MD;  Location: [facility];  Service: Cardiovascular;  Laterality: Left;   LOWER EXTREMITY ANGIOGRAPHY Left 10/13/2021   Procedure: Lower Extremity Angiography;  Surgeon: Jama Cordella MATSU, MD;  Location: [facility];  Service: Cardiovascular;  Laterality: Left;   LOWER EXTREMITY ANGIOGRAPHY Left 10/14/2021   Procedure: Lower Extremity Angiography;  Surgeon: Marea Selinda RAMAN, MD;  Location: [facility];  Service: Cardiovascular;  Laterality: Left;   LOWER EXTREMITY ANGIOGRAPHY Left 11/02/2021   Procedure: Lower Extremity Angiography;  Surgeon: Marea Selinda RAMAN, MD;  Location: [facility];  Service: Cardiovascular;  Laterality: Left;   LOWER EXTREMITY INTERVENTION N/A 12/22/2018   Procedure: LOWER EXTREMITY  INTERVENTION;  Surgeon: Marea Selinda RAMAN, MD;  Location: [facility];  Service: Cardiovascular;  Laterality: N/A;   LOWER EXTREMITY INTERVENTION Left 12/26/2020   Procedure: LOWER EXTREMITY INTERVENTION;  Surgeon: Marea Selinda RAMAN, MD;  Location: [facility];  Service: Cardiovascular;  Laterality: Left;   LOWER EXTREMITY INTERVENTION N/A 09/22/2021   Procedure: LOWER EXTREMITY INTERVENTION;  Surgeon: Marea Selinda RAMAN, MD;  Location: [facility];  Service: Cardiovascular;   Laterality: N/A;   POLYPECTOMY  03/07/2024   Procedure: POLYPECTOMY, INTESTINE;  Surgeon: Therisa Bi, MD;  Location: [facility] ENDOSCOPY;  Service: Gastroenterology;;    FAMILY HISTORY:  We obtained a detailed, 4-generation family history.  Significant diagnoses are listed below: Family History  Problem Relation Age of Onset   Heart attack Mother    Breast cancer Mother 72   Ms. Harjo has two sons, 50 and 86. Her 51 year old son has had a normal colonoscopy, she is unsure if her 69 year old has had one yet. Ms. Pfahler has 2 maternal half brothers and 3 maternal half sisters. At least one of her half sisters has had normal colonoscopy, none have had cancer.   Ms. Seal's mother had breast cancer in her 48s and passed at 22. She was adopted, no info about biological relatives.  Ms. Jacot's father died of cirrhosis but she has no other information about his side of the family.  Ms. Macchi is unaware of previous family history of genetic testing for hereditary cancer risks. There is no reported Ashkenazi Jewish ancestry. There is no known consanguinity.    GENETIC COUNSELING ASSESSMENT: Ms. Tonkovich is a 67 y.o. female with a personal history of >10 colon polyps which is somewhat suggestive of a hereditary cancer syndrome and predisposition to cancer. We, therefore, discussed and recommended the following at today's visit.   DISCUSSION: We discussed that polyps in general are common, however, most people have fewer than 5 lifetime polyps.  When an individual has 10 or more polyps we become concerned about an underlying polyposis syndrome. The most common hereditary polyposis syndromes are caused by problems in the APC and MUTYH genes. We discussed that testing is beneficial for several reasons including knowing how to follow individuals for cancer screenings, and understand if other family members could be at risk for cancer and allow them to undergo genetic testing.   We  reviewed the characteristics, features and inheritance patterns of hereditary cancer syndromes. We also discussed genetic testing, including the appropriate family members to test, the process of testing, insurance coverage and turn-around-time for results. We discussed the implications of a negative, positive and/or variant of uncertain significant result. We recommended Ms. Eiben pursue genetic testing for the [facility] Multi-Cancer+RNA gene panel.   The Multi-Cancer + RNA Panel offered by [facility] includes sequencing and/or deletion/duplication analysis of the following 70 genes:  AIP*, ALK, APC*, ATM*, AXIN2*, BAP1*, BARD1*, BLM*, BMPR1A*, BRCA1*, BRCA2*, BRIP1*, CDC73*, CDH1*, CDK4, CDKN1B*, CDKN2A, CHEK2*, CTNNA1*, DICER1*, EPCAM, EGFR, FH*, FLCN*, GREM1, HOXB13, KIT, LZTR1, MAX*, MBD4, MEN1*, MET, MITF, MLH1*, MSH2*, MSH3*, MSH6*, MUTYH*, NF1*, NF2*, NTHL1*, PALB2*, PDGFRA, PMS2*, POLD1*, POLE*, POT1*, PRKAR1A*, PTCH1*, PTEN*, RAD51C*, RAD51D*, RB1*, RET, SDHA*, SDHAF2*, SDHB*, SDHC*, SDHD*, SMAD4*, SMARCA4*, SMARCB1*, SMARCE1*, STK11*, SUFU*, TMEM127*, TP53*, TSC1*, TSC2*, VHL*. RNA analysis is performed for * genes.  Based on Ms. Routson's personal history of >10 colon polyps  she meets medical criteria for genetic testing. Despite that she meets criteria, she may still have an out of pocket cost.   PLAN:  After considering the risks, benefits, and limitations, Ms. Lawyer provided informed consent to pursue genetic testing and the blood sample was sent to [facility] for analysis of the Multi-Cancer+RNA Panel. Results should be available within approximately 2-3 weeks' time, at which point they will be disclosed by telephone to Ms. Westman, as will any additional recommendations warranted by these results. Ms. Matsushita will receive a summary of her genetic counseling visit and a copy of her results once available. This information will also be available in Epic.   Ms.  Hinote's questions were answered to her satisfaction today. Our contact information was provided should additional questions or concerns arise. Thank you for the referral and allowing us  to share in the care of your patient.   Dena Cary, MS, [facility] Genetic Counselor [email or web address] Phone: [phone]  I personally spent a total of 40 minutes in the care of the patient today including counseling and educating, placing orders, and documenting clinical information in the EHR.  Dr. Delinda was available for discussion regarding this case.   _______________________________________________________________________ For Office Staff:  Number of people involved in session: 1 Was an Intern/ student involved with case: no

## 2024-04-09 ENCOUNTER — Telehealth: Payer: Self-pay | Admitting: Licensed Clinical Social Worker

## 2024-04-09 ENCOUNTER — Encounter: Payer: Self-pay | Admitting: Licensed Clinical Social Worker

## 2024-04-09 ENCOUNTER — Ambulatory Visit: Payer: Self-pay | Admitting: Licensed Clinical Social Worker

## 2024-04-09 DIAGNOSIS — Z1379 Encounter for other screening for genetic and chromosomal anomalies: Secondary | ICD-10-CM

## 2024-04-09 NOTE — Telephone Encounter (Signed)
 I contacted Ms. Karapetian to discuss her genetic testing results. No pathogenic variants were identified in the 70 genes analyzed. Detailed clinic note to follow.   The test report has been scanned into EPIC and is located under the Molecular Pathology section of the Results Review tab.  A portion of the result report is included below for reference.      Dena Cary, MS, [facility] Genetic Counselor [email or web address] Phone: [phone]

## 2024-04-09 NOTE — Progress Notes (Signed)
 HPI:   Sylvia [name] was previously seen in the [facility] due to a personal history of colon polyps and concerns regarding a hereditary predisposition to cancer. Please refer to our prior cancer genetics clinic note for more information regarding our discussion, assessment and recommendations, at the time. Sylvia [name]'s recent genetic test results were disclosed to her, as were recommendations warranted by these results. These results and recommendations are discussed in more detail below.  CANCER HISTORY:  Oncology History   No problem history exists.    FAMILY HISTORY:  We obtained a detailed, 4-generation family history.  Significant diagnoses are listed below: Family History  Problem Relation Age of Onset   Heart attack Mother    Breast cancer Mother 34    Sylvia [name] has two sons, 50 and 53. Her 69 year old son has had a normal colonoscopy, she is unsure if her 69 year old has had one yet. Sylvia [name] has 2 maternal half brothers and 3 maternal half sisters. At least one of her half sisters has had normal colonoscopy, none have had cancer.    Sylvia [name]'s mother had breast cancer in her 14s and passed at 75. She was adopted, no info about biological relatives.   Sylvia [name]'s father died of cirrhosis but she has no other information about his side of the family.   Sylvia [name] is unaware of previous family history of genetic testing for hereditary cancer risks. There is no reported Ashkenazi Jewish ancestry. There is no known consanguinity.     GENETIC TEST RESULTS:  The [facility] Multi-Cancer+RNA Panel found no pathogenic mutations.  The Multi-Cancer + RNA Panel offered by [facility] includes sequencing and/or deletion/duplication analysis of the following 70 genes:  AIP*, ALK, APC*, ATM*, AXIN2*, BAP1*, BARD1*, BLM*, BMPR1A*, BRCA1*, BRCA2*, BRIP1*, CDC73*, CDH1*, CDK4, CDKN1B*, CDKN2A, CHEK2*, CTNNA1*, DICER1*, EPCAM, EGFR, FH*, FLCN*,  GREM1, HOXB13, KIT, LZTR1, MAX*, MBD4, MEN1*, MET, MITF, MLH1*, MSH2*, MSH3*, MSH6*, MUTYH*, NF1*, NF2*, NTHL1*, PALB2*, PDGFRA, PMS2*, POLD1*, POLE*, POT1*, PRKAR1A*, PTCH1*, PTEN*, RAD51C*, RAD51D*, RB1*, RET, SDHA*, SDHAF2*, SDHB*, SDHC*, SDHD*, SMAD4*, SMARCA4*, SMARCB1*, SMARCE1*, STK11*, SUFU*, TMEM127*, TP53*, TSC1*, TSC2*, VHL*. RNA analysis is performed for * genes.  The test report has been scanned into EPIC and is located under the Molecular Pathology section of the Results Review tab.  A portion of the result report is included below for reference. Genetic testing reported out on 04/08/2024.     Even though a pathogenic variant was not identified, possible explanations for the cancer in the family may include: There may be no hereditary risk for cancer in the family. The cancers in Sylvia [name] and/or her family may be sporadic/familial or due to other genetic and environmental factors. There may be a gene mutation in one of these genes that current testing methods cannot detect but that chance is small. There could be another gene that has not yet been discovered, or that we have not yet tested, that is responsible for the cancer diagnoses in the family.  It is also possible there is a hereditary cause for the cancer in the family that Sylvia [name] did not inherit.  Therefore, it is important to remain in touch with cancer genetics in the future so that we can continue to offer Sylvia [name] the most up to date genetic testing.   ADDITIONAL GENETIC TESTING:  We discussed with Sylvia [name] that her genetic testing was fairly extensive.  If there are additional relevant genes identified to increase cancer risk that can  be analyzed in the future, we would be happy to discuss and coordinate this testing at that time.    CANCER SCREENING RECOMMENDATIONS:  Sylvia [name]'s test result is considered negative (normal).  This means that we have not identified a hereditary cause for her  personal history of colon polpys at this time.   An individual's cancer risk and medical management are not determined by genetic test results alone. Overall cancer risk assessment incorporates additional factors, including personal medical history, family history, and any available genetic information that may result in a personalized plan for cancer prevention and surveillance. Therefore, it is recommended she continue to follow the cancer management and screening guidelines provided by her primary healthcare provider.  Based on the reported personal and family history, specific cancer screenings for Sylvia [name] and her family include:  Colon Cancer Screening: This negative genetic test simply tells us  that we cannot yet define why Sylvia [name] has had  an increased number of colorectal polyps.  Sylvia [name]'s medical management and screening should be based on the prospect that she  will likely form more colon polyps and should, therefore, undergo more frequent colonoscopy screening at intervals determined by her GI providers.    RECOMMENDATIONS FOR FAMILY MEMBERS:   Since she did not inherit a identifiable mutation in a cancer predisposition gene included on this panel, her children could not have inherited a known mutation from her in one of these genes. Individuals in this family might be at some increased risk of developing cancer, over the general population risk, due to the family history of cancer.  Individuals in the family should notify their providers of the family history of cancer. We recommend women in this family have a yearly mammogram beginning at age 31, or 59 years younger than the earliest onset of cancer, an annual clinical breast exam, and perform monthly breast self-exams.  Family members should have colonoscopies by at age 48, or earlier, as recommended by their providers.  FOLLOW-UP:  Lastly, we discussed with Sylvia [name] that cancer genetics is a rapidly  advancing field and it is possible that new genetic tests will be appropriate for her and/or her family members in the future. We encouraged her to remain in contact with cancer genetics on an annual basis so we can update her personal and family histories and let her know of advances in cancer genetics that may benefit this family.   Our contact number was provided. Ms. Goldammer's questions were answered to her satisfaction, and she knows she is welcome to [name] us  at anytime with additional questions or concerns.    Dena Cary, MS, [facility] Genetic Counselor [email or web address] Phone: [phone]

## 2024-05-22 ENCOUNTER — Emergency Department: Admission: EM | Admit: 2024-05-22 | Discharge: 2024-05-22 | Disposition: A | Discharge status: Home / Self Care

## 2024-05-22 ENCOUNTER — Other Ambulatory Visit: Payer: Self-pay

## 2024-05-22 DIAGNOSIS — I129 Hypertensive chronic kidney disease with stage 1 through stage 4 chronic kidney disease, or unspecified chronic kidney disease: Secondary | ICD-10-CM | POA: Insufficient documentation

## 2024-05-22 DIAGNOSIS — J449 Chronic obstructive pulmonary disease, unspecified: Secondary | ICD-10-CM | POA: Insufficient documentation

## 2024-05-22 DIAGNOSIS — I2510 Atherosclerotic heart disease of native coronary artery without angina pectoris: Secondary | ICD-10-CM | POA: Insufficient documentation

## 2024-05-22 DIAGNOSIS — J069 Acute upper respiratory infection, unspecified: Secondary | ICD-10-CM | POA: Insufficient documentation

## 2024-05-22 DIAGNOSIS — E1122 Type 2 diabetes mellitus with diabetic chronic kidney disease: Secondary | ICD-10-CM | POA: Insufficient documentation

## 2024-05-22 DIAGNOSIS — N189 Chronic kidney disease, unspecified: Secondary | ICD-10-CM | POA: Insufficient documentation

## 2024-05-22 LAB — RESP PANEL BY RT-PCR (RSV, FLU A&B, COVID)  RVPGX2
Influenza A by PCR: NEGATIVE
Influenza B by PCR: NEGATIVE
Resp Syncytial Virus by PCR: NEGATIVE
SARS Coronavirus 2 by RT PCR: NEGATIVE

## 2024-05-22 MED ORDER — BENZONATATE 100 MG PO CAPS: 100.0000 mg | ORAL_CAPSULE | Freq: Three times a day (TID) | ORAL | 0 refills | Status: AC | PRN

## 2024-05-22 MED ORDER — GUAIFENESIN-CODEINE 100-10 MG/5ML PO SOLN: 10.0000 mL | Freq: Four times a day (QID) | ORAL | 0 refills | Status: AC | PRN

## 2024-05-22 NOTE — ED Notes (Signed)
 ED Provider at bedside.

## 2024-05-22 NOTE — ED Notes (Signed)
 Pt verbalized understanding of discharge instructions. Opportunity for questions provided.

## 2024-05-22 NOTE — ED Provider Notes (Signed)
 "  [facility] Provider Note    Event Date/Time   First MD Initiated Contact with Patient 05/22/24 1706     (approximate)   History   Generalized Body Aches   HPI  Sylvia [name] is a 69 y.o. female PMH of diabetes, COPD, hypertension,, CAD, CKD, bilateral BKA presents for evaluation of generalized bodyaches.  Patient states she has had cough, congestion and bodyaches for couple days.  Patient denies fevers.  She has been taking TheraFlu to treat her symptoms.      Physical Exam   Triage Vital Signs: ED Triage Vitals  Encounter Vitals Group     BP 05/22/24 1550 (!) 164/74     Girls Systolic BP Percentile --      Girls Diastolic BP Percentile --      Boys Systolic BP Percentile --      Boys Diastolic BP Percentile --      Pulse Rate 05/22/24 1550 (!) 108     Resp 05/22/24 1550 18     Temp 05/22/24 1550 98.8 F (37.1 C)     Temp src --      SpO2 05/22/24 1550 99 %     Weight 05/22/24 1551 132 lb 0.9 oz (59.9 kg)     Height 05/22/24 1551 5' (1.524 m)     Head Circumference --      Peak Flow --      Pain Score 05/22/24 1551 10     Pain Loc --      Pain Education --      Exclude from Growth Chart --     Most recent vital signs: Vitals:   05/22/24 1550 05/22/24 1849  BP: (!) 164/74   Pulse: (!) 108 100  Resp: 18   Temp: 98.8 F (37.1 C)   SpO2: 99%    General: Awake, no distress.  CV:  Good peripheral perfusion.  RRR. Resp:  Normal effort.  Wheezing bilaterally. Abd:  No distention.  Other:     ED Results / Procedures / Treatments   Labs (all labs ordered are listed, but only abnormal results are displayed) Labs Reviewed  RESP PANEL BY RT-PCR (RSV, FLU A&B, COVID)  [id number]   PROCEDURES:  Critical Care performed: No  Procedures   MEDICATIONS ORDERED IN ED: Medications - No data to display   IMPRESSION / MDM / ASSESSMENT AND PLAN / ED COURSE  I reviewed the triage vital signs and the nursing notes.                              69 year old female presents for evaluation of generalized bodyaches.  Patient was hypertensive but does have history of hypertension.  Patient was initially tachycardic but this improved without specific intervention.  Patient NAD on exam.  Differential diagnosis includes, but is not limited to, viral infection like flu, COVID, RSV, asthma exacerbation, COPD exacerbation, other viral URI.  Patient's presentation is most consistent with acute complicated illness / injury requiring diagnostic workup.  Respiratory panel was negative for flu, COVID and RSV.  Did offer patient chest x-ray and she declined.  I explained that this could be done in the next few days if she remains symptomatic.  Suspect that her symptoms are due to a viral URI.  Recommended continued symptomatic management using OTC cold medications.  We discussed follow-up with PCP and when to return to the ED.  Will give her some cough  medications.  Emphasized the importance of rest and drinking lots of fluids.  Patient voiced understanding, all questions were answered and she was stable at discharge.      FINAL CLINICAL IMPRESSION(S) / ED DIAGNOSES   Final diagnoses:  Viral URI     Rx / DC Orders   ED Discharge Orders          Ordered    benzonatate  (TESSALON ) 100 MG capsule  3 times daily PRN        05/22/24 1848    guaiFENesin -codeine  100-10 MG/5ML syrup  Every 6 hours PRN        05/22/24 1848             Note:  This document was prepared using Dragon voice recognition software and may include unintentional dictation errors.   Cleaster Tinnie LABOR, PA-C 05/22/24 1850    Fernand Rossie HERO, MD 05/22/24 2313  "

## 2024-08-02 ENCOUNTER — Inpatient Hospital Stay

## 2024-08-02 ENCOUNTER — Inpatient Hospital Stay: Admitting: Oncology
